# Patient Record
Sex: Male | Born: 1945 | Race: White | Hispanic: No | Marital: Married | Smoking: Former smoker
Health system: Southern US, Community
[De-identification: ages and names within clinical notes are randomized; demographics above are authoritative.]

## PROBLEM LIST (undated history)

## (undated) DIAGNOSIS — M25552 Pain in left hip: Secondary | ICD-10-CM

## (undated) DIAGNOSIS — N281 Cyst of kidney, acquired: Secondary | ICD-10-CM

## (undated) DIAGNOSIS — N400 Enlarged prostate without lower urinary tract symptoms: Secondary | ICD-10-CM

## (undated) DIAGNOSIS — T8859XA Other complications of anesthesia, initial encounter: Secondary | ICD-10-CM

## (undated) DIAGNOSIS — I10 Essential (primary) hypertension: Secondary | ICD-10-CM

## (undated) DIAGNOSIS — I6340 Cerebral infarction due to embolism of unspecified cerebral artery: Secondary | ICD-10-CM

## (undated) DIAGNOSIS — F32A Depression, unspecified: Secondary | ICD-10-CM

## (undated) DIAGNOSIS — Z7901 Long term (current) use of anticoagulants: Secondary | ICD-10-CM

## (undated) DIAGNOSIS — G4730 Sleep apnea, unspecified: Secondary | ICD-10-CM

## (undated) DIAGNOSIS — I5030 Unspecified diastolic (congestive) heart failure: Secondary | ICD-10-CM

## (undated) DIAGNOSIS — IMO0002 Reserved for concepts with insufficient information to code with codable children: Secondary | ICD-10-CM

## (undated) DIAGNOSIS — J45909 Unspecified asthma, uncomplicated: Secondary | ICD-10-CM

## (undated) DIAGNOSIS — I69391 Dysphagia following cerebral infarction: Secondary | ICD-10-CM

## (undated) DIAGNOSIS — E119 Type 2 diabetes mellitus without complications: Secondary | ICD-10-CM

## (undated) DIAGNOSIS — I639 Cerebral infarction, unspecified: Secondary | ICD-10-CM

## (undated) DIAGNOSIS — R200 Anesthesia of skin: Secondary | ICD-10-CM

## (undated) DIAGNOSIS — T4145XA Adverse effect of unspecified anesthetic, initial encounter: Secondary | ICD-10-CM

## (undated) DIAGNOSIS — I480 Paroxysmal atrial fibrillation: Secondary | ICD-10-CM

## (undated) DIAGNOSIS — M25551 Pain in right hip: Secondary | ICD-10-CM

## (undated) DIAGNOSIS — I509 Heart failure, unspecified: Secondary | ICD-10-CM

## (undated) DIAGNOSIS — Z87442 Personal history of urinary calculi: Secondary | ICD-10-CM

## (undated) DIAGNOSIS — I319 Disease of pericardium, unspecified: Secondary | ICD-10-CM

## (undated) DIAGNOSIS — F329 Major depressive disorder, single episode, unspecified: Secondary | ICD-10-CM

## (undated) DIAGNOSIS — G463 Brain stem stroke syndrome: Secondary | ICD-10-CM

## (undated) DIAGNOSIS — C801 Malignant (primary) neoplasm, unspecified: Secondary | ICD-10-CM

## (undated) DIAGNOSIS — D649 Anemia, unspecified: Secondary | ICD-10-CM

## (undated) DIAGNOSIS — I2699 Other pulmonary embolism without acute cor pulmonale: Secondary | ICD-10-CM

## (undated) DIAGNOSIS — Z8371 Family history of colonic polyps: Secondary | ICD-10-CM

## (undated) HISTORY — PX: ARTHROSCOPY KNEE W/ DRILLING: SUR92

## (undated) HISTORY — DX: Essential (primary) hypertension: I10

## (undated) HISTORY — DX: Brain stem stroke syndrome: G46.3

## (undated) HISTORY — DX: Dysphagia following cerebral infarction: I69.391

## (undated) HISTORY — DX: Sleep apnea, unspecified: G47.30

## (undated) HISTORY — DX: Unspecified diastolic (congestive) heart failure: I50.30

## (undated) HISTORY — DX: Family history of colonic polyps: Z83.71

## (undated) HISTORY — DX: Benign prostatic hyperplasia without lower urinary tract symptoms: N40.0

## (undated) HISTORY — DX: Unspecified asthma, uncomplicated: J45.909

## (undated) HISTORY — PX: TONSILLECTOMY: SUR1361

## (undated) HISTORY — DX: Other pulmonary embolism without acute cor pulmonale: I26.99

## (undated) HISTORY — PX: OTHER SURGICAL HISTORY: SHX169

## (undated) HISTORY — DX: Morbid (severe) obesity due to excess calories: E66.01

## (undated) HISTORY — DX: Cerebral infarction due to embolism of unspecified cerebral artery: I63.40

## (undated) HISTORY — DX: Major depressive disorder, single episode, unspecified: F32.9

## (undated) HISTORY — DX: Depression, unspecified: F32.A

## (undated) HISTORY — DX: Reserved for concepts with insufficient information to code with codable children: IMO0002

## (undated) HISTORY — PX: WISDOM TOOTH EXTRACTION: SHX21

## (undated) HISTORY — DX: Long term (current) use of anticoagulants: Z79.01

## (undated) HISTORY — PX: LITHOTRIPSY: SUR834

---
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
## 1987-04-04 HISTORY — PX: CHOLECYSTECTOMY: SHX55

## 1998-04-28 ENCOUNTER — Emergency Department (HOSPITAL_COMMUNITY): Admission: EM | Admit: 1998-04-28 | Discharge: 1998-04-28 | Payer: Self-pay | Admitting: Emergency Medicine

## 1999-12-23 ENCOUNTER — Encounter (INDEPENDENT_AMBULATORY_CARE_PROVIDER_SITE_OTHER): Payer: Self-pay | Admitting: *Deleted

## 1999-12-23 ENCOUNTER — Ambulatory Visit (HOSPITAL_BASED_OUTPATIENT_CLINIC_OR_DEPARTMENT_OTHER): Admission: RE | Admit: 1999-12-23 | Discharge: 1999-12-23 | Payer: Self-pay | Admitting: Orthopedic Surgery

## 2001-04-19 ENCOUNTER — Emergency Department (HOSPITAL_COMMUNITY): Admission: EM | Admit: 2001-04-19 | Discharge: 2001-04-19 | Payer: Self-pay | Admitting: Emergency Medicine

## 2001-04-19 ENCOUNTER — Encounter: Payer: Self-pay | Admitting: Emergency Medicine

## 2001-04-24 ENCOUNTER — Encounter: Admission: RE | Admit: 2001-04-24 | Discharge: 2001-04-24 | Payer: Self-pay | Admitting: Urology

## 2001-04-24 ENCOUNTER — Encounter: Payer: Self-pay | Admitting: Urology

## 2003-04-02 ENCOUNTER — Emergency Department (HOSPITAL_COMMUNITY): Admission: EM | Admit: 2003-04-02 | Discharge: 2003-04-03 | Payer: Self-pay | Admitting: Emergency Medicine

## 2003-04-03 ENCOUNTER — Ambulatory Visit (HOSPITAL_COMMUNITY): Admission: EM | Admit: 2003-04-03 | Discharge: 2003-04-03 | Payer: Self-pay | Admitting: Emergency Medicine

## 2003-04-09 ENCOUNTER — Ambulatory Visit (HOSPITAL_COMMUNITY): Admission: RE | Admit: 2003-04-09 | Discharge: 2003-04-09 | Payer: Self-pay | Admitting: Urology

## 2003-12-31 ENCOUNTER — Encounter: Payer: Self-pay | Admitting: Internal Medicine

## 2004-02-05 ENCOUNTER — Ambulatory Visit: Payer: Self-pay | Admitting: Internal Medicine

## 2004-09-02 ENCOUNTER — Ambulatory Visit: Payer: Self-pay | Admitting: Internal Medicine

## 2004-09-08 ENCOUNTER — Ambulatory Visit: Payer: Self-pay | Admitting: Psychiatry

## 2004-09-08 ENCOUNTER — Ambulatory Visit: Payer: Self-pay | Admitting: Internal Medicine

## 2004-09-08 ENCOUNTER — Inpatient Hospital Stay (HOSPITAL_COMMUNITY): Admission: RE | Admit: 2004-09-08 | Discharge: 2004-09-15 | Payer: Self-pay | Admitting: Psychiatry

## 2004-09-19 ENCOUNTER — Other Ambulatory Visit (HOSPITAL_COMMUNITY): Admission: RE | Admit: 2004-09-19 | Discharge: 2004-09-22 | Payer: Self-pay | Admitting: Psychiatry

## 2005-04-28 ENCOUNTER — Ambulatory Visit: Payer: Self-pay | Admitting: Internal Medicine

## 2005-08-02 ENCOUNTER — Ambulatory Visit: Payer: Self-pay | Admitting: Internal Medicine

## 2005-08-08 ENCOUNTER — Ambulatory Visit: Payer: Self-pay | Admitting: Internal Medicine

## 2005-08-10 ENCOUNTER — Ambulatory Visit (HOSPITAL_COMMUNITY): Payer: Self-pay | Admitting: *Deleted

## 2005-10-10 ENCOUNTER — Ambulatory Visit (HOSPITAL_COMMUNITY): Payer: Self-pay | Admitting: *Deleted

## 2005-10-24 ENCOUNTER — Ambulatory Visit: Payer: Self-pay | Admitting: Internal Medicine

## 2005-10-25 ENCOUNTER — Ambulatory Visit (HOSPITAL_BASED_OUTPATIENT_CLINIC_OR_DEPARTMENT_OTHER): Admission: RE | Admit: 2005-10-25 | Discharge: 2005-10-25 | Payer: Self-pay | Admitting: Internal Medicine

## 2005-11-03 ENCOUNTER — Ambulatory Visit: Payer: Self-pay | Admitting: Pulmonary Disease

## 2005-11-27 ENCOUNTER — Ambulatory Visit (HOSPITAL_COMMUNITY): Admission: RE | Admit: 2005-11-27 | Discharge: 2005-11-27 | Payer: Self-pay | Admitting: Orthopedic Surgery

## 2005-12-12 ENCOUNTER — Ambulatory Visit (HOSPITAL_COMMUNITY): Payer: Self-pay | Admitting: *Deleted

## 2005-12-18 ENCOUNTER — Ambulatory Visit: Payer: Self-pay | Admitting: Internal Medicine

## 2005-12-26 ENCOUNTER — Ambulatory Visit: Payer: Self-pay | Admitting: Internal Medicine

## 2006-02-06 ENCOUNTER — Ambulatory Visit (HOSPITAL_COMMUNITY): Payer: Self-pay | Admitting: *Deleted

## 2006-04-17 ENCOUNTER — Ambulatory Visit (HOSPITAL_COMMUNITY): Payer: Self-pay | Admitting: *Deleted

## 2006-04-18 ENCOUNTER — Ambulatory Visit: Payer: Self-pay | Admitting: Internal Medicine

## 2006-04-18 LAB — CONVERTED CEMR LAB
AST: 22 units/L (ref 0–37)
Creatinine,U: 159.9 mg/dL
Direct LDL: 184 mg/dL
Microalb Creat Ratio: 2.5 mg/g (ref 0.0–30.0)
Microalb, Ur: 0.4 mg/dL (ref 0.0–1.9)
Potassium: 4 meq/L (ref 3.5–5.1)
Total CHOL/HDL Ratio: 8.7
Triglycerides: 192 mg/dL — ABNORMAL HIGH (ref 0–149)

## 2006-06-06 ENCOUNTER — Ambulatory Visit: Payer: Self-pay | Admitting: Internal Medicine

## 2006-06-12 ENCOUNTER — Ambulatory Visit (HOSPITAL_COMMUNITY): Payer: Self-pay | Admitting: *Deleted

## 2006-06-13 ENCOUNTER — Ambulatory Visit: Payer: Self-pay

## 2006-06-14 ENCOUNTER — Ambulatory Visit: Payer: Self-pay

## 2006-06-25 ENCOUNTER — Ambulatory Visit: Payer: Self-pay | Admitting: Internal Medicine

## 2006-06-25 ENCOUNTER — Inpatient Hospital Stay (HOSPITAL_COMMUNITY): Admission: RE | Admit: 2006-06-25 | Discharge: 2006-06-29 | Payer: Self-pay | Admitting: Orthopedic Surgery

## 2006-06-26 ENCOUNTER — Ambulatory Visit: Payer: Self-pay | Admitting: Physical Medicine & Rehabilitation

## 2006-06-27 ENCOUNTER — Encounter (INDEPENDENT_AMBULATORY_CARE_PROVIDER_SITE_OTHER): Payer: Self-pay | Admitting: *Deleted

## 2006-06-28 DIAGNOSIS — N400 Enlarged prostate without lower urinary tract symptoms: Secondary | ICD-10-CM | POA: Insufficient documentation

## 2006-06-28 DIAGNOSIS — I10 Essential (primary) hypertension: Secondary | ICD-10-CM | POA: Insufficient documentation

## 2006-06-28 DIAGNOSIS — M109 Gout, unspecified: Secondary | ICD-10-CM | POA: Insufficient documentation

## 2006-06-29 ENCOUNTER — Ambulatory Visit: Payer: Self-pay | Admitting: Physical Medicine & Rehabilitation

## 2006-06-29 ENCOUNTER — Inpatient Hospital Stay (HOSPITAL_COMMUNITY)
Admission: RE | Admit: 2006-06-29 | Discharge: 2006-07-04 | Payer: Self-pay | Admitting: Physical Medicine & Rehabilitation

## 2006-08-14 ENCOUNTER — Ambulatory Visit (HOSPITAL_COMMUNITY): Payer: Self-pay | Admitting: *Deleted

## 2006-09-04 ENCOUNTER — Telehealth (INDEPENDENT_AMBULATORY_CARE_PROVIDER_SITE_OTHER): Payer: Self-pay | Admitting: *Deleted

## 2006-09-13 ENCOUNTER — Ambulatory Visit (HOSPITAL_COMMUNITY): Payer: Self-pay | Admitting: *Deleted

## 2006-10-30 ENCOUNTER — Ambulatory Visit (HOSPITAL_COMMUNITY): Payer: Self-pay | Admitting: *Deleted

## 2006-12-12 ENCOUNTER — Ambulatory Visit: Payer: Self-pay | Admitting: Internal Medicine

## 2006-12-16 LAB — CONVERTED CEMR LAB
ALT: 22 units/L (ref 0–53)
Bilirubin, Direct: 0.1 mg/dL (ref 0.0–0.3)
CO2: 31 meq/L (ref 19–32)
Cholesterol: 188 mg/dL (ref 0–200)
Creatinine,U: 105.2 mg/dL
GFR calc Af Amer: 98 mL/min
GFR calc non Af Amer: 81 mL/min
Glucose, Bld: 110 mg/dL — ABNORMAL HIGH (ref 70–99)
HDL: 27.3 mg/dL — ABNORMAL LOW (ref >39.0)
Hgb A1c MFr Bld: 5.8 % (ref 4.6–6.0)
Microalb Creat Ratio: 10.5 mg/g (ref 0.0–30.0)
Microalb, Ur: 1.1 mg/dL (ref 0.0–1.9)
Potassium: 4.2 meq/L (ref 3.5–5.1)
Sodium: 143 meq/L (ref 135–145)
Total CHOL/HDL Ratio: 6.9
Total Protein: 6.6 g/dL (ref 6.0–8.3)
Triglycerides: 228 mg/dL (ref 0–149)

## 2006-12-17 ENCOUNTER — Encounter (INDEPENDENT_AMBULATORY_CARE_PROVIDER_SITE_OTHER): Payer: Self-pay | Admitting: *Deleted

## 2006-12-20 ENCOUNTER — Ambulatory Visit (HOSPITAL_COMMUNITY): Payer: Self-pay | Admitting: *Deleted

## 2007-03-19 ENCOUNTER — Ambulatory Visit (HOSPITAL_COMMUNITY): Payer: Self-pay | Admitting: *Deleted

## 2007-04-25 ENCOUNTER — Telehealth (INDEPENDENT_AMBULATORY_CARE_PROVIDER_SITE_OTHER): Payer: Self-pay | Admitting: *Deleted

## 2007-04-29 ENCOUNTER — Telehealth (INDEPENDENT_AMBULATORY_CARE_PROVIDER_SITE_OTHER): Payer: Self-pay | Admitting: *Deleted

## 2007-05-08 ENCOUNTER — Telehealth (INDEPENDENT_AMBULATORY_CARE_PROVIDER_SITE_OTHER): Payer: Self-pay | Admitting: *Deleted

## 2007-06-18 ENCOUNTER — Ambulatory Visit (HOSPITAL_COMMUNITY): Payer: Self-pay | Admitting: *Deleted

## 2007-07-11 ENCOUNTER — Telehealth (INDEPENDENT_AMBULATORY_CARE_PROVIDER_SITE_OTHER): Payer: Self-pay | Admitting: *Deleted

## 2007-08-06 ENCOUNTER — Ambulatory Visit: Payer: Self-pay | Admitting: Internal Medicine

## 2007-08-06 DIAGNOSIS — E8881 Metabolic syndrome: Secondary | ICD-10-CM | POA: Insufficient documentation

## 2007-08-06 LAB — CONVERTED CEMR LAB: LDL Goal: 100 mg/dL

## 2007-08-07 ENCOUNTER — Ambulatory Visit: Payer: Self-pay | Admitting: Internal Medicine

## 2007-08-07 LAB — CONVERTED CEMR LAB
AST: 27 units/L (ref 0–37)
Albumin: 3.5 g/dL (ref 3.5–5.2)
Creatinine, Ser: 1.1 mg/dL (ref 0.4–1.5)
HDL: 25.2 mg/dL — ABNORMAL LOW (ref >39.0)
Hgb A1c MFr Bld: 6 % (ref 4.6–6.0)
Potassium: 3.7 meq/L (ref 3.5–5.1)
Total Bilirubin: 0.9 mg/dL (ref 0.3–1.2)
Total CHOL/HDL Ratio: 7.3
Triglycerides: 197 mg/dL — ABNORMAL HIGH (ref 0–149)
VLDL: 39 mg/dL (ref 0–40)

## 2007-08-09 ENCOUNTER — Encounter: Payer: Self-pay | Admitting: Internal Medicine

## 2007-08-13 ENCOUNTER — Encounter (INDEPENDENT_AMBULATORY_CARE_PROVIDER_SITE_OTHER): Payer: Self-pay | Admitting: *Deleted

## 2007-09-17 ENCOUNTER — Ambulatory Visit (HOSPITAL_COMMUNITY): Payer: Self-pay | Admitting: *Deleted

## 2007-12-10 ENCOUNTER — Ambulatory Visit (HOSPITAL_COMMUNITY): Payer: Self-pay | Admitting: *Deleted

## 2008-03-10 ENCOUNTER — Ambulatory Visit (HOSPITAL_COMMUNITY): Payer: Self-pay | Admitting: *Deleted

## 2008-04-03 DIAGNOSIS — Z83719 Family history of colon polyps, unspecified: Secondary | ICD-10-CM

## 2008-04-03 DIAGNOSIS — Z8371 Family history of colonic polyps: Secondary | ICD-10-CM

## 2008-04-03 HISTORY — DX: Family history of colonic polyps: Z83.71

## 2008-04-03 HISTORY — DX: Family history of colon polyps, unspecified: Z83.719

## 2008-06-09 ENCOUNTER — Ambulatory Visit (HOSPITAL_COMMUNITY): Payer: Self-pay | Admitting: *Deleted

## 2008-08-27 ENCOUNTER — Telehealth (INDEPENDENT_AMBULATORY_CARE_PROVIDER_SITE_OTHER): Payer: Self-pay | Admitting: *Deleted

## 2008-09-08 ENCOUNTER — Ambulatory Visit (HOSPITAL_COMMUNITY): Payer: Self-pay | Admitting: *Deleted

## 2008-09-10 ENCOUNTER — Ambulatory Visit: Payer: Self-pay | Admitting: Internal Medicine

## 2008-09-10 DIAGNOSIS — F32A Depression, unspecified: Secondary | ICD-10-CM | POA: Insufficient documentation

## 2008-09-10 DIAGNOSIS — F329 Major depressive disorder, single episode, unspecified: Secondary | ICD-10-CM

## 2008-09-10 DIAGNOSIS — Z87442 Personal history of urinary calculi: Secondary | ICD-10-CM | POA: Insufficient documentation

## 2008-09-10 DIAGNOSIS — G4733 Obstructive sleep apnea (adult) (pediatric): Secondary | ICD-10-CM | POA: Insufficient documentation

## 2008-09-10 DIAGNOSIS — E785 Hyperlipidemia, unspecified: Secondary | ICD-10-CM | POA: Insufficient documentation

## 2008-09-11 ENCOUNTER — Ambulatory Visit: Payer: Self-pay | Admitting: Internal Medicine

## 2008-09-14 LAB — CONVERTED CEMR LAB
ALT: 28 units/L (ref 0–53)
AST: 30 units/L (ref 0–37)
Albumin: 3.4 g/dL — ABNORMAL LOW (ref 3.5–5.2)
BUN: 21 mg/dL (ref 6–23)
Creatinine, Ser: 1 mg/dL (ref 0.4–1.5)
HDL: 28.9 mg/dL — ABNORMAL LOW (ref >39.00)
TSH: 1.67 microintl units/mL (ref 0.35–5.50)
Total CHOL/HDL Ratio: 6
Uric Acid, Serum: 7.3 mg/dL (ref 4.0–7.8)
VLDL: 34.8 mg/dL (ref 0.0–40.0)

## 2008-09-16 ENCOUNTER — Encounter (INDEPENDENT_AMBULATORY_CARE_PROVIDER_SITE_OTHER): Payer: Self-pay | Admitting: *Deleted

## 2008-09-17 ENCOUNTER — Telehealth (INDEPENDENT_AMBULATORY_CARE_PROVIDER_SITE_OTHER): Payer: Self-pay | Admitting: *Deleted

## 2008-09-17 ENCOUNTER — Ambulatory Visit: Payer: Self-pay | Admitting: Internal Medicine

## 2008-10-27 ENCOUNTER — Ambulatory Visit (HOSPITAL_COMMUNITY): Admission: RE | Admit: 2008-10-27 | Discharge: 2008-10-27 | Payer: Self-pay | Admitting: Internal Medicine

## 2008-10-27 ENCOUNTER — Encounter: Payer: Self-pay | Admitting: Internal Medicine

## 2008-10-27 ENCOUNTER — Ambulatory Visit: Payer: Self-pay | Admitting: Internal Medicine

## 2008-10-28 ENCOUNTER — Encounter: Payer: Self-pay | Admitting: Internal Medicine

## 2008-12-01 ENCOUNTER — Encounter: Payer: Self-pay | Admitting: Internal Medicine

## 2008-12-08 ENCOUNTER — Ambulatory Visit (HOSPITAL_COMMUNITY): Payer: Self-pay | Admitting: *Deleted

## 2008-12-11 ENCOUNTER — Encounter: Payer: Self-pay | Admitting: Internal Medicine

## 2009-02-01 ENCOUNTER — Ambulatory Visit (HOSPITAL_COMMUNITY): Payer: Self-pay | Admitting: Psychiatry

## 2009-04-05 ENCOUNTER — Ambulatory Visit (HOSPITAL_COMMUNITY): Payer: Self-pay | Admitting: Psychiatry

## 2009-04-28 ENCOUNTER — Ambulatory Visit (HOSPITAL_COMMUNITY): Payer: Self-pay | Admitting: Licensed Clinical Social Worker

## 2009-04-30 ENCOUNTER — Ambulatory Visit (HOSPITAL_COMMUNITY): Payer: Self-pay | Admitting: Psychiatry

## 2009-05-26 ENCOUNTER — Ambulatory Visit (HOSPITAL_COMMUNITY): Payer: Self-pay | Admitting: Licensed Clinical Social Worker

## 2009-06-07 ENCOUNTER — Ambulatory Visit (HOSPITAL_COMMUNITY): Payer: Self-pay | Admitting: Psychiatry

## 2009-06-17 ENCOUNTER — Ambulatory Visit (HOSPITAL_COMMUNITY): Payer: Self-pay | Admitting: Licensed Clinical Social Worker

## 2009-06-28 ENCOUNTER — Ambulatory Visit (HOSPITAL_COMMUNITY): Payer: Self-pay | Admitting: Licensed Clinical Social Worker

## 2009-07-05 ENCOUNTER — Ambulatory Visit (HOSPITAL_COMMUNITY): Payer: Self-pay | Admitting: Psychiatry

## 2009-08-02 ENCOUNTER — Ambulatory Visit (HOSPITAL_COMMUNITY): Payer: Self-pay | Admitting: Psychiatry

## 2009-08-10 ENCOUNTER — Ambulatory Visit (HOSPITAL_COMMUNITY): Payer: Self-pay | Admitting: Licensed Clinical Social Worker

## 2009-08-26 ENCOUNTER — Ambulatory Visit (HOSPITAL_COMMUNITY): Payer: Self-pay | Admitting: Licensed Clinical Social Worker

## 2009-09-03 ENCOUNTER — Ambulatory Visit (HOSPITAL_COMMUNITY): Payer: Self-pay | Admitting: Licensed Clinical Social Worker

## 2009-09-09 ENCOUNTER — Ambulatory Visit (HOSPITAL_COMMUNITY): Payer: Self-pay | Admitting: Licensed Clinical Social Worker

## 2009-09-21 ENCOUNTER — Ambulatory Visit (HOSPITAL_COMMUNITY): Payer: Self-pay | Admitting: Licensed Clinical Social Worker

## 2009-09-29 ENCOUNTER — Ambulatory Visit (HOSPITAL_COMMUNITY): Payer: Self-pay | Admitting: Licensed Clinical Social Worker

## 2009-10-18 ENCOUNTER — Ambulatory Visit (HOSPITAL_COMMUNITY): Payer: Self-pay | Admitting: Licensed Clinical Social Worker

## 2009-10-25 ENCOUNTER — Ambulatory Visit (HOSPITAL_COMMUNITY): Payer: Self-pay | Admitting: Licensed Clinical Social Worker

## 2009-11-01 ENCOUNTER — Ambulatory Visit (HOSPITAL_COMMUNITY): Payer: Self-pay | Admitting: Psychiatry

## 2009-11-02 ENCOUNTER — Ambulatory Visit: Payer: Self-pay | Admitting: Internal Medicine

## 2009-11-02 ENCOUNTER — Ambulatory Visit (HOSPITAL_COMMUNITY): Payer: Self-pay | Admitting: Licensed Clinical Social Worker

## 2009-11-02 DIAGNOSIS — G629 Polyneuropathy, unspecified: Secondary | ICD-10-CM | POA: Insufficient documentation

## 2009-11-02 DIAGNOSIS — Z860100 Personal history of colon polyps, unspecified: Secondary | ICD-10-CM | POA: Insufficient documentation

## 2009-11-02 DIAGNOSIS — R972 Elevated prostate specific antigen [PSA]: Secondary | ICD-10-CM | POA: Insufficient documentation

## 2009-11-03 ENCOUNTER — Ambulatory Visit: Payer: Self-pay | Admitting: Internal Medicine

## 2009-11-04 LAB — CONVERTED CEMR LAB
Albumin: 3.7 g/dL (ref 3.5–5.2)
BUN: 20 mg/dL (ref 6–23)
Basophils Absolute: 0.1 10*3/uL (ref 0.0–0.1)
CO2: 28 meq/L (ref 19–32)
Calcium: 8.6 mg/dL (ref 8.4–10.5)
Cholesterol: 166 mg/dL (ref 0–200)
Creatinine, Ser: 1.1 mg/dL (ref 0.4–1.5)
Glucose, Bld: 97 mg/dL (ref 70–99)
HCT: 42.7 % (ref 39.0–52.0)
HDL: 30.3 mg/dL — ABNORMAL LOW (ref >39.00)
Hemoglobin: 14.3 g/dL (ref 13.0–17.0)
Lymphs Abs: 1.5 10*3/uL (ref 0.7–4.0)
MCHC: 33.6 g/dL (ref 30.0–36.0)
MCV: 93.1 fL (ref 78.0–100.0)
Monocytes Relative: 8.4 % (ref 3.0–12.0)
Neutro Abs: 6.3 10*3/uL (ref 1.4–7.7)
RDW: 13.9 % (ref 11.5–14.6)
TSH: 1.86 microintl units/mL (ref 0.35–5.50)
Total Protein: 6.3 g/dL (ref 6.0–8.3)
Triglycerides: 157 mg/dL — ABNORMAL HIGH (ref 0.0–149.0)

## 2009-11-17 ENCOUNTER — Ambulatory Visit (HOSPITAL_COMMUNITY): Payer: Self-pay | Admitting: Licensed Clinical Social Worker

## 2009-11-22 ENCOUNTER — Telehealth: Payer: Self-pay | Admitting: Internal Medicine

## 2009-11-30 ENCOUNTER — Ambulatory Visit (HOSPITAL_COMMUNITY): Payer: Self-pay | Admitting: Licensed Clinical Social Worker

## 2009-12-09 ENCOUNTER — Ambulatory Visit (HOSPITAL_COMMUNITY): Payer: Self-pay | Admitting: Licensed Clinical Social Worker

## 2009-12-17 ENCOUNTER — Ambulatory Visit (HOSPITAL_COMMUNITY): Payer: Self-pay | Admitting: Licensed Clinical Social Worker

## 2010-01-03 ENCOUNTER — Ambulatory Visit (HOSPITAL_COMMUNITY): Payer: Self-pay | Admitting: Psychiatry

## 2010-01-04 ENCOUNTER — Ambulatory Visit (HOSPITAL_COMMUNITY): Payer: Self-pay | Admitting: Licensed Clinical Social Worker

## 2010-02-10 ENCOUNTER — Ambulatory Visit (HOSPITAL_COMMUNITY): Payer: Self-pay | Admitting: Licensed Clinical Social Worker

## 2010-03-07 ENCOUNTER — Ambulatory Visit (HOSPITAL_COMMUNITY): Payer: Self-pay | Admitting: Psychiatry

## 2010-05-03 NOTE — Assessment & Plan Note (Signed)
Summary: med refill/kn   Vital Signs:  Patient profile:   65 year old male Height:      71.25 inches Weight:      338.2 pounds BMI:     47.01 Temp:     98.9 degrees F oral Pulse rate:   84 / minute Resp:     16 per minute BP sitting:   118 / 70  (left arm) Cuff size:   large  Vitals Entered By: Shonna Chock CMA (November 02, 2009 2:42 PM)    History of Present Illness: [name] is here for a physical; he is essentially asymptomatic except for decreased sensation in feet gradually  progressive over 5- 6 yrs. This has affected his sense of balance.  Lipid Management History:      Positive NCEP/ATP III risk factors include male age 72 years old or older, HDL cholesterol less than 40, family history for ischemic heart disease (males less than 79 years old), and hypertension.  Negative NCEP/ATP III risk factors include non-diabetic, non-tobacco-user status, no ASHD (atherosclerotic heart disease), no prior stroke/TIA, no peripheral vascular disease, and no history of aortic aneurysm.     Allergies: 1)  ! Darvocet 2)  ! Codeine 3)  ! Morphine 4)  ! Pcn  Past History:  Past Medical History: BENIGN PROSTATIC HYPERTROPHY (ICD-600.00), Dr  Londell Moh Kimbrough HYPERTENSION (ICD-401.9) GOUT (ICD-274.9) Nephrolithiasis, PMH  of, uric acid stone X2 Sleep Apnea, CPAP  Depression, Dr  Lolly Mustache Complex Renal Cyst Hyperlipidemia:NMR 2005: LDL 132 ( 2075/1674), HDL 27, TG 132. LDL  goal = < 100. Framingham Study LDL goal = < 130. Colonic polyps, hx of  Past Surgical History: Total knee replacement   bilaterally  06/2006 LUE  CTS  and trigger finger surgically repaired Cholecystectomy Renal calculi (urate) X2, 1st passed spontaneously Tonsillectomy lithotripsy 2005 X1 oral surgery knee surgery arthroscopy 11/2005 Colonoscopy negative; Elevated PSA, PMH of, biopsy X 2 negative Colon polypectomy 2010, due 2015  Family History: mother: htn, depression father: MI  @ age 94 ,died age  43 paternal uncle: diabetes paternal cousin :diabetes  paternal grandmother: MI in 67's paternal uncle: MI in 20's; paternal aunt: cva; bro; depression  Social History: Former Smoker: quit 1979 Retired Married Alcohol use-yes: occasionally  Regular exercise-yes:  gym 60-90 min as [employer] bike &  machines  Review of Systems General:  Denies chills, fever, and sweats; Apnea controlled with CPAP. Eyes:  Denies blurring, double vision, and vision loss-both eyes. ENT:  Denies difficulty swallowing and hoarseness. CV:  Complains of swelling of feet; denies chest pain or discomfort, difficulty breathing at night, difficulty breathing while lying down, shortness of breath with exertion, and swelling of hands; Occasional chest pain. Resp:  Denies cough, excessive snoring, hypersomnolence, and sputum productive. GI:  Denies abdominal pain, bloody stools, dark tarry stools, and indigestion. GU:  Denies discharge, dysuria, and hematuria; Dr Aldean Ast seen every 6 months. MS:  Complains of joint pain; denies joint redness and joint swelling; Occasional hip & ankle pain; no Rx. Derm:  Denies dryness and hair loss; Trauma induced toenail loss. Neuro:  Denies disturbances in coordination, headaches, and tingling; No burning in feet. Psych:  Denies anxiety and depression; Symptoms stable with meds & therapy. Endo:  Denies excessive hunger, excessive thirst, excessive urination, and heat intolerance; Avoids cool air to arms. Heme:  Denies abnormal bruising and bleeding. Allergy:  Denies itching eyes and sneezing.  Physical Exam  General:   alert,appropriate and cooperative throughout examination Head:  Normocephalic and atraumatic without obvious abnormalities. No apparent alopecia ; moustache Eyes:  No corneal or conjunctival inflammation noted.Perrla. Funduscopic exam benign, without hemorrhages, exudates or papilledema. Ears:  External ear exam shows no significant lesions or deformities.   Otoscopic examination reveals clear canals, tympanic membranes are intact bilaterally without bulging, retraction, inflammation or discharge. Hearing is grossly normal bilaterally. Nose:  External nasal examination shows no deformity or inflammation. Nasal mucosa are pink and moist without lesions or exudates. Mouth:  Oral mucosa and oropharynx without lesions or exudates.  Teeth in good repair. Neck:  No deformities, masses, or tenderness noted. Lungs:  Normal respiratory effort, chest expands symmetrically. Lungs are clear to auscultation, no crackles or wheezes. Heart:  Normal rate and regular rhythm. S1 and S2 normal without gallop, murmur, click, rub. s4 with slurring Abdomen:  Bowel sounds positive,abdomen soft and non-tender without masses, organomegaly or hernias noted. Abdomen protuberant Rectal:  Dr Madaline Guthrie:  Dr Aldean Ast Prostate:  Dr Aldean Ast Msk:  No deformity or scoliosis noted of thoracic or lumbar spine.   Pulses:  R and L carotid,radial,dorsalis pedis and posterior tibial pulses are full and equal bilaterally Extremities:  No clubbing, cyanosis, edema. Crepitus of knees.DIP amputation R index finger Neurologic:  alert & oriented X3, sensation  decreased  to light touch over R foot, and DTRs symmetrical but decreased @ knees Skin:  Intact without suspicious lesions or rashes Cervical Nodes:  No lymphadenopathy noted Axillary Nodes:  No palpable lymphadenopathy Psych:  memory intact for recent and remote, normally interactive, good eye contact, not anxious appearing, and not depressed appearing.     Impression & Recommendations:  Problem # 1:  ROUTINE GENERAL MEDICAL EXAM@HEALTH  CARE FACL (ICD-V70.0)  Orders: EKG w/ Interpretation (93000)  Problem # 2:  PARESTHESIA (ICD-782.0) progressive in feet  Problem # 3:  HYPERLIPIDEMIA (ICD-272.4)  His updated medication list for this problem includes:    Pravastatin Sodium 40 Mg Tabs (Pravastatin sodium) .[name]... 1 by  mouth once daily patient needs office visit and lab work  Problem # 4:  DYSMETABOLIC SYNDROME X (ICD-277.7)  Problem # 5:  SLEEP APNEA (ICD-780.57)  Problem # 6:  PROSTATE SPECIFIC ANTIGEN, ELEVATED (ICD-790.93) PMH of  Problem # 7:  HYPERTENSION (ICD-401.9) controlled His updated medication list for this problem includes:    Amlodipine Besy-benazepril Hcl 5-10 Mg Caps (Amlodipine besy-benazepril hcl) .[name]... 1 by mouth once daily  Problem # 8:  GOUT (ICD-274.9) PMH of His updated medication list for this problem includes:    Allopurinol 300 Mg Tabs (Allopurinol) .[name]... 1 by mouth once daily  Complete Medication List: 1)  Pravastatin Sodium 40 Mg Tabs (Pravastatin sodium) .[name].. 1 by mouth once daily patient needs office visit and lab work 2)  Amlodipine Besy-benazepril Hcl 5-10 Mg Caps (Amlodipine besy-benazepril hcl) .[name].. 1 by mouth once daily 3)  Sertraline Hcl 100 Mg Tabs (Sertraline hcl) .[name].. 1 1/2 by mouth once daily 4)  Allopurinol 300 Mg Tabs (Allopurinol) .[name].. 1 by mouth once daily 5)  Lorazepam 0.5 Mg Tabs (Lorazepam) .[name].. 1 by mouth am, 2 by mouth pm 6)  Risperdal M-tab 1 Mg Tbdp (Risperidone) .[name].. 1 by mouth once daily 7)  Asa 250 Qd  8)  Multivitamin  9)  Finasteride 5 Mg Tabs (Finasteride) .[name].. 1 by mouth once daily  Other Orders: Tdap => 80yrs IM ([address]109) Admin 1st Vaccine (60454)  Lipid Assessment/Plan:      Based on NCEP/ATP III, the patient's risk factor category is "0-1 risk  factors".  The patient's lipid goals are as follows: Total cholesterol goal is 200; LDL cholesterol goal is 100; HDL cholesterol goal is 40; Triglyceride goal is 150.  His LDL cholesterol goal has not been met.  Secondary causes for hyperlipidemia have been ruled out.  He has been counseled on adjunctive measures for lowering his cholesterol and has been provided with dietary instructions.    Patient Instructions: 1)  Please schedule fasting labs: Uric acid; B12 level; VDRL; 2)  BMP; 3)   Hepatic Panel ; 4)  Lipid Panel ; 5)  TSH ; 6)  CBC w/ Diff; 7)  PSA ; 8)  HbgA1C . Prescriptions: AMLODIPINE BESY-BENAZEPRIL HCL 5-10 MG  CAPS (AMLODIPINE BESY-BENAZEPRIL HCL) 1 by mouth once daily  #90 x 3   Entered and Authorized by:   Marga Melnick MD   Signed by:   Marga Melnick MD on 11/02/2009   Method used:   Faxed to ...       [name] (retail)       [address] [place], [place]  [address]       Ph: [phone]       Fax: [phone]   RxID:   [id number]    Immunizations Administered:  Tetanus Vaccine:    Vaccine Type: Tdap    Site: right deltoid    Mfr: GlaxoSmithKline    Dose: 0.5 ml    Route: IM    Given by: Shonna Chock CMA    Exp. Date: 06/26/2011    Lot #: [id number]    VIS given: 02/19/07 version given November 02, 2009.

## 2010-05-03 NOTE — Progress Notes (Signed)
Summary: NEEDS DR HOPPER TO REVIEW TO SEE IF LAB CAN BE BILLD DIFFERENTLY  Phone Note Call from Patient Call back at Home Phone [phone]   Caller: Patient Summary of Call: PATIENT CALLED TO ASK WHY HE IS GETTING A BILL FOR $55.75 FROM [facility] BECAUSE HE HAS MEDICARE AND [employer] [place] AND SAYS HE SHOULD NOT BE BILLED FOR ANYTHING   I CALLED [facility] ([facility] = [phone])  (PATIENT ACCT NUMBER = [id number]) AND REP SAYS THAT HE IS BEING BILLED FOR [id number] [facility]---REP SAID WE USED THE FOLLOWING CODES IN THIS ORDER:  V70.0, 782.0, 277.7  PATIENT WOULD LIKE DR HOPPER TO REVIEW TO SEE IF THIS COULD BE BILLED DIFFERENTLY  PLEASE CALL PATIENT WITH RESPONSE AT [phone] Initial call taken by: Jerolyn Shin,  November 22, 2009 4:20 PM  Follow-up for Phone Call        RPR requested for symptoms of PERIPHERAL NEUROPATHY; insurance should cover this under that Code Follow-up by: Marga Melnick MD,  November 22, 2009 5:10 PM     Appended Document: NEEDS DR HOPPER TO REVIEW TO SEE IF LAB CAN BE BILLD DIFFERENTLY he had paresthesias ; syphilitic nerve involvement  is in the differential necessitating this screen. How can this be billed to help patient? Thanks, [employer]

## 2010-06-08 ENCOUNTER — Encounter (INDEPENDENT_AMBULATORY_CARE_PROVIDER_SITE_OTHER): Payer: No Typology Code available for payment source | Admitting: Psychiatry

## 2010-06-08 DIAGNOSIS — F331 Major depressive disorder, recurrent, moderate: Secondary | ICD-10-CM

## 2010-08-16 NOTE — Discharge Summary (Signed)
[name], [name]             ACCOUNT NO.:  [id number]   MEDICAL RECORD NO.:  [id number]          PATIENT TYPE:  INP   LOCATION:  1510                         FACILITY:  [facility]   PHYSICIAN:  Madlyn Frankel. Charlann Boxer, M.D.  DATE OF BIRTH:  1945/10/10   DATE OF ADMISSION:  06/25/2006  DATE OF DISCHARGE:  06/29/2006                               DISCHARGE SUMMARY   ADMITTING DIAGNOSES:  1. Osteoarthritis.  2. Hypertension.  3. Diverticulitis.  4. Depression.   DISCHARGE DIAGNOSIS:  1. Osteoarthritis.  2. Hypertension.  3. Depression.  4. Diverticulitis.  5. Acute renal failure.   CONSULTANTS:  Dr. Lovell Sheehan for acute renal failure.  [facility] for rehab  consult for placement.  [facility] for medical consult for  acute renal failure.   PROCEDURE:  Bilateral total knee replacement.   SURGEON:  Durene Romans, M.D.   ASSISTANT:  Dwyane Luo, PA-C.   HISTORY OF PRESENT ILLNESS:  [name] is a 65 year old gentleman who  has persistent progressive knee pain over the past 5-6 years.  He has  also had previous arthroscopic intervention on his right knee in the  past.  He has failed injections, all conservative measures including  visco supplementations.  He was cleared for surgery by his primary care  doctor, Dr. Marga Melnick.  Due to diminished quality of life, he was  cleared for bilateral total knee replacements.   LABORATORY DATA:  CBC preoperatively showed hematocrit 42.5.  Postop day  #1:  White cells up to 11.3, hematocrit 33.2.  Postop day #2 white cell  still at 10.6, hematocrit 28.  Postop day #3 hematocrit 27.7.  Postop  day #4, hematocrit 25.5.  Preadmission INR showed 1.0 preoperatively.  By postop day #2 his INR was up to 2.4 and postoperative day #3 it was  3.5.  Preadmission chemistry shows sodium 142, potassium 4.0, glucose  112, creatinine 1.09.  Postop day #1 sodium 134, potassium 4.3, glucose  128, creatinine 2.05.  Later on in the day at 1918 hours sodium  131,  glucose 129, creatinine was 4.04.  Post a day #2 at 0430 hours  creatinine was 4.31.  By that day at 1505 hours creatinine was coming  down at 3.99, post day #2 on June 29, 2006 creatinine was resolving at  2.05.  Preadmission GFR was greater than 60.  On June 26, 2006 his GFR  was  33.  Same day it was 15.  At 1918 hours on June 27, 2006 it was  14.  The next day June 27, 2006 GFR 15.  On June 29, 2006 it was 68.  Calcium remained slightly depressed at 7.7 and upon discharge was 8.0.  GI workup:  Admission all within normal limits.  On March 28, 208 his  total protein was 5, albumin 2, AST 30, ALP was 130.  Preadmission  urinalysis on June 18, 2006 was all negative.  Urinalysis on June 27, 2006 showed turbid appearance, large hemoglobin, small bilirubin  moderate, leukocyte esterase as well as some phosphate hyaline casts.  Urine culture showed no growth.   RADIOLOGY:  Portable chest one-view impression:  Cardiomegaly and  chronic lung changes.  No acute process.  Renal ultrasound.  Impression:  1) No evidence for hydronephrosis.  2)  Bilateral renal cyst.  Prominent  parapelvic cyst in left kidney.  3)  Kidney signs of discrepancy may be  a technical factor.   EKG:  Borderline abnormal EKG.   CARDIOLOGY:  [facility] nuclear study as well as stress echo  performed preoperatively.  They showed no evidence of ischemia.  Ejection fraction 59%.   HOSPITAL COURSE:  The patient underwent bilateral total knee replacement  and was admitted to the orthopedic floor.  His pain was well-controlled  throughout.  He remained neuromuscularly and vascularly intact  throughout.  The patient does have a history of sleep apnea, so was  placed on CPAP.  Pharmacy helped manage his Coumadin throughout his  course of stay.  When we saw him on postop day #1 he was awake, alert  and oriented.  He had some was low sats over the evening, but the pain  was well-controlled with the PCA.   Dressing was clean, dry and intact  bilaterally.  His Hemovac was pulled from his left knee.  He was  weightbearing as tolerated.  Went ahead and scheduled consult for [facility].  His initial INR was 1.1.  Initial PT evaluation was done  recommending rehab.  Inpatient rehab saw him and he did qualify with the  bilateral total knee replacements.  His creatinine was elevated after  postop day #1, acute renal failure, was seen by Dr. Lovell Sheehan for this.  All of his blood pressure medicines were stopped at that time.  He was  given some fluid boluses.  An ultrasound renal was scheduled.  [place]  saw him on June 29, 2006 for consult follow up.  On June 29, 2006  [facility] followed up.  Seen originally for acute renal  failure.  IV fluids were increased to 150 an hour, Lasix 40 mg given by  IV every 8 hours and we rechecked the B-MET.  We held the ACE, held the  CCB and were using Cipro for potential UTI.  Orthopedically he had made  slow progress.  This was due to the bilateral replacements as well as  with the renal issues.  By the time of his discharge, orthopedically he  was stable.  He had resolving acute renal failure.  He was stable and  ready for discharge to rehab.   DISCHARGE DISPOSITION:  Discharge stable and improved condition to rehab  with resolving acute renal failure.   Goals of physical therapy will be to minimize pain, increase range of  motion, maximize strength, work on gait training and proprioception.  The patient will be weightbearing as tolerated with the use of rolling  walker over the next several weeks at which point he will progress to a  single point cane.   DISCHARGE WOUND CARE:  Keep wound dry.  Change dressing on a daily  basis.   DISCHARGE DIET:  Regular as tolerated by the patient.   DISCHARGE MEDICATIONS:  1. Zoloft 150 mg 1 p.o. daily.  2. Lorazepam 0.5 mg 1 p.o. t.i.d. 3. Risperdal 0.5 mg 1 p.o. daily.  4. Lotrel 5/10 1 p.o. daily.  5.  Allopurinol 300 mg 1 p.o. daily.  6. Vitamin E 400 units 1 daily.  7. Zinc.  8. Folic acid  9. Psyllium.  10.Fish oil.  11.Coumadin per pharmacy recommendation to maintain INR  between 2-3.  12.Vicodin 5/325 1-2 p.o. q.4-6h. p.r.n. pain.  13.Robaxin 500 mg 1 p.o. q.6h. p.r.n. muscle spasm.   DISCHARGE FOLLOW-UP:  Dr. Charlann Boxer in 2 weeks.   DISCHARGE SPECIAL INSTRUCTIONS:  TED hose on 12, off 12.  If the patient  develops any acute shortness of breath, severe calf pain please call  emergency service immediately.     ______________________________  Yetta Glassman [address], [place]      Madlyn Frankel. Charlann Boxer, M.D.  Electronically Signed    BLM/MEDQ  D:  08/02/2006  T:  08/02/2006  Job:  [id number]

## 2010-08-19 NOTE — Op Note (Addendum)
[name], [name]             ACCOUNT NO.:  [id number]   MEDICAL RECORD NO.:  [id number]          PATIENT TYPE:  AMB   LOCATION:  SDS                          FACILITY:  [facility]   PHYSICIAN:  Almedia Balls. Ranell Patrick, M.D. DATE OF BIRTH:  1946/02/08   DATE OF PROCEDURE:  11/27/2005  DATE OF DISCHARGE:  11/27/2005                                 OPERATIVE REPORT   PREOPERATIVE DIAGNOSIS:  Right knee medial meniscus tear and chondromalacia.   POSTOPERATIVE DIAGNOSES:  1. Right knee medial meniscus tear.  2. 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  3. Right knee medial tibial condyle chondromalacia, grade 3/4.  4. Loose body in the joint.  5. 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   PROCEDURE PERFORMED:  Right knee arthroscopy with debridement of posterior  horn medial meniscus tear, abrasion chondroplasty, medial femoral condyle  and patellofemoral joint and medial tibial condyle, and removal of loose  body.   ATTENDING SURGEON:  Almedia Balls. Ranell Patrick, MD   ASSISTANTS:  None.   ANESTHESIA:  Masked general anesthesia was used.   ESTIMATED BLOOD LOSS:  Minimal.   FLUID REPLACEMENT:  1200 mL crystalloid.   INSTRUMENT COUNTS:  Correct.   COMPLICATIONS:  None.   PREOPERATIVE ANTIBIOTICS:  Given.   INDICATIONS:  The patient is a 65 year old male with history of worsening  right medial knee pain.  The patient has failed conservative management and  has a meniscus tear on clinical and MRI examination.  The patient presents  now for operative treatment to relieve pain and restore function.  Informed  consent was obtained.   DESCRIPTION OF PROCEDURE:  After an adequate level of anesthesia was  achieved, the patient was positioned supine on the operating room table.  A  lateral ____ QA MARKER: 64 ____ was u  drape of the right knee, we entered the knee arthroscopically using general  arthroscopic portals.  Diagnostic arthroscopy revealed significant  patellofemoral chondromalacia, grade 3.  This was central wear, most notably  in the trochlea.  We performed a chondroplasty in that compartment, removed  all loose flaps of tissue and fibrillation.  We had nice, smooth articular  cartilage when we finished.  There were some areas of grade 4 change,  primarily deep in the trochlea.  The medial and lateral gutters inspected,  free of loose bodies.  Medial compartment entered.  There was noted to be a  complex posterior horn medial meniscus tear.  This was debrided back to  stable meniscal tissue involving removing a majority of the posterior horn  of meniscus, approximately 60-70%.  The remainder of the medial meniscus was  probed and felt to be stable.  There was significant medial femoral condyle  chondromalacia, grade 3 and 4.  All loose flaps, cartilage and tissue were  removed.  The remaining cartilage was stable.  Again, there were some areas  of ____ QA MARKER: 118 ____ wear.  little better condition with grade 3 changes and fibrillation.  This was all  smoothed out using a chondroplasty technique with a motorized shaver.  ACL,  PCL intact.  A fairly sizeable, 2  x 3-mm loose body present, attached to the  anterior horn of lateral meniscus.  This was removed using pituitary  rongeur.  The remainder of lateral meniscus was normal, and the lateral  articular cartilage showed some mild, grade 2 changes; otherwise, normal.  Following the abrasion chondroplasty in the medial compartment and  patellofemoral compartment and the posterior horn medial meniscectomy and  the removal of loose body, we concluded surgery with the suturing of wounds  with 4-0 Monocryl, followed by Steri-Strips and sterile dressing.  The  patient was taken to the recovery room having tolerated surgery well.           ______________________________  Almedia Balls. Ranell Patrick, M.D.     SRN/MEDQ  D:  11/27/2005  T:  11/28/2005  Job:  [id number]

## 2010-08-19 NOTE — Consult Note (Signed)
[name], [name]                       ACCOUNT NO.:  [id number]   MEDICAL RECORD NO.:  [id number]                   PATIENT TYPE:  INP   LOCATION:  0101                                 [facility]AC[facility]  [facility]   PHYSICIAN:  Jamison Neighbor, M.D.               DATE OF BIRTH:  18-Jun-1945   DATE OF CONSULTATION:  04/03/2003  DATE OF DISCHARGE:                                   CONSULTATION   REFERRING PHYSICIAN:  Rhae Lerner. Margretta Ditty, M.D.   REASON FOR CONSULTATION:  Right ureteropelvic junction stone with  uncontrolled pain.   HISTORY:  This 65 year old male presented to the emergency room with right  flank pain.  The patient had a CT scan performed and was found to have an 11  mm stone at the right UPJ.  The patient's pain is significant, and he is  requiring Dilaudid.  He is going to require stent placement to eliminate his  pain.   PAST MEDICAL HISTORY:  Remarkable for hypertension.  The patient is  currently on Lotrel 5 mg.  The patient does have a past urologic history of  stones on one occasion in the past.  In addition, he has had an elevated PSA  and when biopsied by Dr. Aldean Ast, this was negative.   Medical history is otherwise quite unremarkable.  He has no other chronic  medical illnesses.  His only previous surgery was a cholecystectomy.   He has no known allergies.   Family history, social history, and review of systems are unremarkable.   PHYSICAL EXAMINATION:  GENERAL:  On examination today the patient's exam is  remarkable for some right-sided flank pain.  No masses, rebound, or guarding  could be seen.  The patient is very large in size, and he was apprised of  the fact that it may be difficult for him to undergo ESWL.  He is very close  to 300 pounds in size.  GENITOURINARY:  Unremarkable.  His testicles are normal in size, shape, and  consistency, with no hydrocele, spermatocele, varicocele, hernia, or  adenopathy.  RECTAL:  Not performed.  EXTREMITIES:  No  clubbing, cyanosis, or edema.  Neurologic and vascular systems appeared intact.   CT scan shows some right hydronephrosis secondary to a right UPJ stone.  There also appears to be a cystic structure on the left kidney.   IMPRESSION:  Right ureteropelvic junction stone with pain.   PLAN:  Stent placement, followed by ESWL.                                               Jamison Neighbor, M.D.    RJE/MEDQ  D:  04/03/2003  T:  04/03/2003  Job:  [id number]   cc:   Rhae Lerner. Margretta Ditty, M.D.  [address]  [place]  [place] [address]   Londell Moh Shelle Iron., M.D.  [address]  [place]  [place] [address]  Fax: [phone]

## 2010-08-19 NOTE — H&P (Signed)
NAMESLOAN, TAKAGI             ACCOUNT NO.:  [id number]   MEDICAL RECORD NO.:  [id number]          PATIENT TYPE:  IPS   LOCATION:  4011                         FACILITY:  [facility]   PHYSICIAN:  Ranelle Oyster, M.D.DATE OF BIRTH:  1945-05-11   DATE OF ADMISSION:  06/29/2006  DATE OF DISCHARGE:                              HISTORY & PHYSICAL   CHIEF COMPLAINT:  Bilateral knee pain.   HISTORY OF PRESENT ILLNESS:  This is a 65 year old obese white male  admitted to [facility] with progressive knee pain for 5+ years  failing conservative measures.  The patient has developed end-stage  osteoarthritis.  He underwent bilateral total knee replacements on June 25, 2006 by Dr. Charlann Boxer.  He is placed on Coumadin for DVT prophylaxis.  Postoperatively, his course was complicated by increased creatinine to  4.  This was treated with aggressive fluid resuscitation.  Most recent  creatinine as of today was 2.0.  Renal workup was negative for  hydronephrosis.  Lotrel and allopurinol were held until improvement of  his renal function occurred.  Medicine has been following the patient  for these ongoing issues.  He has had moderate postoperative anemia with  most recent hemoglobin 8.9.  The patient's Norvasc and allopurinol were  resumed today.  The patient continues to suffer from functional deficits  related to his knee surgery and osteoarthritis in the setting of his  other medical issues and was brought to the rehab unit today.   REVIEW OF SYSTEMS:  Notable for reflux, low back pain, anxiety,  depression.  He had his Foley catheter removed today.  He has been  voiding thus far.  Denies constipation.  Other pertinent positives  listed above and full review is in the written history and physical.   PAST MEDICAL HISTORY:  1. Positive sleep apnea on CPAP.  2. Depression/anxiety.  3. Hypertension.  4. Diverticulitis.  5. Left carpal tunnel release.  6. History of kidney stone.  7.  Gout.  8. Partial right index finger amputation.  9. Cholecystectomy.   FAMILY HISTORY:  Positive for CAD.   SOCIAL HISTORY:  The patient lives with his wife in [place].  He is  currently on disability for depression.  His wife works.  He has an  elderly mother who can assist him on a limited basis.  The patient was  independent with his cane prior to arrival.   ALLERGIES:  PENICILLIN, CODEINE, AND DARVOCET.   HOME MEDICATIONS:  1. Risperdal 0.5 mg at bedtime.  2. Lorazepam 0.5 mg 3 tablets daily.  3. Zoloft 100 mg one and one half daily.  4. Allopurinol 300 mg at bedtime.  5. Lotrel 5/10 daily.  6. Diclofenac 50 mg at bedtime  7. Folic acid 400 mg daily.  8. Multivitamin daily.   LABORATORIES:  Hemoglobin 8.9, sodium 139, potassium 3.6, BUN and  creatinine 25 and 2.1, INR today 3.5.   PHYSICAL EXAMINATION:  VITAL SIGNS:  Blood pressure 140/76, pulse 85,  respiratory rate 20, temperature 98.3.  Patient saturating 97% on room  air.  GENERAL:  Patient is obese in no  acute distress.  He is lying  comfortably in bed.  EAR/NOSE AND THROAT:  Exam is unremarkable with fair to good dentition.  Oral mucosa is pink and moist.  NECK:  Supple without JVD or lymphadenopathy.  CHEST:  Clear to auscultation bilaterally without any obvious wheezes,  rales, or rhonchi.  HEART:  Regular rate and rhythm without murmurs, rubs, or gallops.  ABDOMEN:  Obese, nontender.  Bowel sounds are positive.  SKIN:  Skin was notable for bilateral knee incisions which are clean,  dry, and intact.  The patient was tolerating CPM on the right side  without issue from zero to 40 degrees.  EXTREMITIES:  Showed 2+ edema below the surgical sites.  Upper  extremities have no edema.  NEUROLOGICALLY:  Cranial nerves II-XII are grossly intact.  Reflexes  were 1+ to 2+ bilaterally.  Sensation essentially was within normal  limits except for the edema associated lower extremity areas which had  diminished  pinprick.  Memory, mood and affect were all appropriate  today.  Motor function was 5/5 in the upper extremities.  He had 4/5  strength at the ankles.  Knees were not tested.  Hip strength was 1+ to  2/5.   ASSESSMENT AND PLAN:  1. Functional deficits related to bilateral osteoarthritis of the      knees status post bilateral total knee replacement:  Begin      comprehensive inpatient rehab with physical therapy to assess and      treat for range of motion strengthening, bed mobility, transfers      and gait.  Occupational therapy will assess and treat with range of      motion, strengthening, activities of daily living and equipment.      Rehab nurse will follow for bowel, bladder, skin and medication      issues as well as pain management.  Case manager/social worker will      assess for psychosocial needs and discharge planning.  Estimated      length of stay is 7+ days.  Prognosis good.  Goals modified      independent supervision.  2. Acute renal failure:  Continue to monitor electrolytes and renal      function.  Push oral intake.  3. Hypertension:  Continue Norvasc.  Blood pressure currently under      control.  4. Mood:  The patient maintains on Risperdal, Ativan, and Zoloft.  5. Postoperative anemia:  Check admission CBC.  Continue iron      supplementation.  6. Gout:  Allopurinol resumed today for prophylaxis.  7. Sleep apnea:  Maintain patient on continuous positive airway      pressure.  Monitor oxygen saturations.  8. Deep vein thrombosis prophylaxis:  Continue Coumadin.  Hold today      due to increased INR.  9. Pain management with Tylenol and Vicodin as needed.  Also encourage      ice as needed.      Ranelle Oyster, M.D.  Electronically Signed     ZTS/MEDQ  D:  06/29/2006  T:  06/29/2006  Job:  [id number]

## 2010-08-19 NOTE — Letter (Signed)
June 20, 2006    Via facsimile - [phone]   Almedia Balls. Ranell Patrick, M.D.  [facility]  [address]  [address]  [place], [place]  [address]   RE:  [name], [name]  MRN:  [id number]  /  DOB:  1945/05/04   Dear Dr. Ranell Patrick:   Thank you for referring [name]) for preoperative  evaluation.  He is scheduled for total knee replacement on June 25, 2006.  He states that, I am to the point where the  operative pain  cannot get any worse than what I am experiencing now.  He has  difficulty even getting out of bed and cannot stand for longer than one  hour.  The pain is worse attempting to climb stairs.   Despite the arthroscopy in August 2007, there have been no improvement  in the symptoms.   He has had major surgery in the past, specifically, cholecystectomy.  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  He has had no  perioperative complications in the past.  Synvisc and cortisone  injections have failed to result in improvement in his symptoms.   On exam, the most striking finding is his metabolic syndrome/morbid  obesity appearance.  He has gained approximately two pounds to 318.  Vital signs were good with a pulse of 60, respirations 20, blood  pressure 102/68.  He had a grade 1-1/2 systolic murmur.  Exercise in the  office was not attempted.  Abdomen is protuberant but not tender.   Fusiform enlargement of the knees was present as well as crepitus.   A rest stress test was performed on June 13, 2006.  This did not show  any significant EKG changes or evidence of ischemia.   Although I would prefer to have [name] comply with diet and nutrition  recommendations and lose a significant amount of weight before the  surgery, that is unlikely to happen.  Reluctantly, I clear him for  surgery, although, I do feel he is at markedly increased risk due to his  morbid obesity.  My  concerns and these risks were discussed with him  frankly.   He continues CPAP with good response.  I will recommend a Statin because  of his LDL of 184.  Presently, this value will be associated with a  moderately high risk of premature heart attack or stroke at 16%.  Surprisingly, his A1c is 5.8, which is compatible with average sugar of  128 and a 16% cardiovascular risk.  He has been given printed  information on the risks of metabolic syndrome.   Should medical problems arise during the hospitalization, the [facility] service can be consulted.    Sincerely,      [name] Dubin. Alwyn Ren, MD,FACP,FCCP  Electronically Signed    [facility]/MedQ  DD: 06/20/2006  DT: 06/20/2006  Job #: [id number]

## 2010-08-19 NOTE — Op Note (Signed)
NAMEOSWALD, POTT             ACCOUNT NO.:  [id number]   MEDICAL RECORD NO.:  [id number]          PATIENT TYPE:  INP   LOCATION:  X009                         FACILITY:  [facility]   PHYSICIAN:  Madlyn Frankel. Charlann Boxer, M.D.  DATE OF BIRTH:  Sep 24, 1945   DATE OF PROCEDURE:  06/25/2006  DATE OF DISCHARGE:                               OPERATIVE REPORT   PREOPERATIVE DIAGNOSIS:  Bilateral knee osteoarthritis.   POSTOPERATIVE DIAGNOSIS:  Bilateral knee osteoarthritis.   PROCEDURE:  Bilateral total knee replacement.   COMPONENTS USED:  For both knees:  DePuy rotating platform, posterior  stabilized knee system with a size 5 femur, 5 tibia, 10 mm insert and a  41 patellar button on both knees.   SURGEON:  Madlyn Frankel. Charlann Boxer, M.D.   ASSISTANT:  Yetta Glassman. Mann, PA   ANESTHESIA:  Dermal spinal.   BLOOD LOSS:  200 mL total.   DRAINS:  One on the left knee.   COMPLICATIONS:  None.   INDICATIONS:  Mr. Lacek is a pleasant 65 year old gentleman who was  sent over for evaluation of bilateral knee osteoarthritis.  He had  progressive discomfort that failed conservative measures, including  surgical procedures in the past.  No injections or medications were  providing any relief.   After reviewing with him his current situation, he wished to proceed  with bilateral total knee replacement.  I discussed the increased risk  of co-morbidities of renal cardiac issues, as well as increased risk of  DVT and pneumonia.  In addition to the standard risks of infection,  dislocation of the component and component failure, stiffness and  persistent pain postoperatively.  Given these risks, he consented for  the procedure.   PROCEDURE IN DETAIL:  The patient was brought to operative theater.   Please note this will be dictated as a single operative note, but will  be 2 separate procedures.  Once adequate anesthesia and preoperative  antibiotics (1 gram of vancomycin) were administered, the patient  was  positioned supine.  Proximal thigh tourniquets were placed on both  sides.   At this point both lower extremities were prescrubbed and prepped and  draped in a sterile fashion, with a 2-hole extremity drape.   Attention was first directed to the left knee.  The left knee was as  follows.  The leg was exsanguinated and tourniquet elevated.  Midline  incision was made, followed by medial parapatellar arthrotomy with  patella subluxation rather than eversion.  Knee exposure obtained in  routine fashion, including proximal medial peel.   Following knee exposure, I attended to the patella.  Precut measurement  was 27 millimeters.  I resected down to 15 mm and used a 41 patellar  button.  This fit nicely.  I drilled holes and put a shim in its place  to protect the patella against retractors.   At this point, attention was first directed to the femur.  The drill was  inserted into the femur.  I  irrigated to prevent fat emboli.  An  intramedullary rod was then passed.  At 5 degrees of valgus, 10 mm of  bone was resected off the distal femur.   I then sized the femur to be a size 5.  On this left knee there was a  slight bit of neutral rotation, so I did elevate the medial side to  increase the rotation to be perpendicular to Whiteside's line; and more  closely approximate the epicondylar axis.  The size 5 anterior-posterior  chamfer cuts were then made without notching or complication.  The final  trochlear box cut was made, based off the lateral aspect of the distal  femur.   With these cuts made and debridements of the medial and lateral aspects  of the femur and osteophytes carried out, attention was directed to the  tibia.  Tibia exposure was obtained in routine fashion with a retractor  placement.  With an extramedullary guide and perpendicular tibial shaft,  I then resected 10 mm of bone off the proximal lateral tibia.  This  amounted to about 2-4 mm cut medially.  This bone  was resected and  measured to be a size 5.  Following this cut, I checked my cut with an  extension block to make sure the knee came out to full extension -- and  it did.  At this point the pins were removed and final tibial  preparation carried out.  With the tibial component in correct  orientation, it was drilled and keel punched.   I then did a trial reduction with size 5 femur, 5 tibia and a 10-mm  insert.  The knee came out to full extension and was stable.  The  patella tracked without application of pressure.   All trial components were removed.  I injected the knee with 60 mL of  0.25% Marcaine with epinephrine.   I then irrigated the knee out thoroughly, while components were opened.  Cement was mixed.  The components were cemented in position, with the  tibia first, then femur, and then the patella.  The leg was brought to  full extension with a 10 mm insert in place to allow the cement to cure.  Excessive cement was debrided at this point, and then once the cement  cured in the posterior aspect, the knee was cleared up.  Once I was  satisfied there was no visible cement anywhere, the final 10 mm insert  was placed.   At this point, the tourniquet was let down at 55 minutes from 300 mmHg.  At this point, I did use some FloSeal in the joints to try and help with  hemostasis.   A medium Hemovac drain was placed on this side.  I then brought the knee  to flexion, and with a #1 Vicryl reapproximated the extensor mechanism.  I then reapproximated the subcutaneous layer with 2-0 Vicryl and 4-0  running Monocryl on the skin.  The skin was cleaned, dried and dressed  sterilely with Steri-Strips, dressing sponges and, at this point, a  [place] Ace wrap.   Following this, attention was now directed to the right knee.  The right  knee was exsanguinated, tourniquet elevated.  A midline incision was made, followed by a medial parapatellar arthrotomy.  Knee exposure  obtained in  routine fashion.  Attention was first directed to the  patella.  Precut measurement was again 27 mm; I resected down to 15 mm  and used a 41 button.  Again, it was drilled and patella shim placed.  Following further knee exposure and debridement of the meniscus,  attention was directed to the  femur.  The femur was then drilled and at  5 degrees valgus for a right knee at this point, I resected 10 mm of  bone.  Osteophytes were debrided.  I sized the femur to be a size 5,  similar to the left side.  With the posterior condylar axis at 3  degrees, this more closely approximated the external rotation to  Whiteside's line, and no changes were made.  The anterior, posterior and  chamfer cuts were then made without complication.  The final trochlear  box cut was then made off the lateral aspect of the distal femur.  Following debridement of osteophytes along the medial and lateral aspect  of the knee, attention was directed to the tibia.  Tibia exposure was  obtained in routine fashion.  Retractors were placed.  Using  extramedullary guide, again I resected 10 mm of bone off the proximal  lateral tibia.  This cut surface also measured to 5.   The 10 mm bone cut was made.  The knee was brought out to extension,  with extension guide noting that it came to full extension.   Following this, the final tibial preparation was carried out with  drilling and keel punching.  I checked the alignment and was happy that  it passed through the center of the ankle in AP and lateral planes.   At this point, a trial reduction was carried out with a 5 femur and 10  mm insert.  The knee came out to full extension and was stable through  flexion.  The patella tracked without application of pressure.   At this point, final components were brought onto the field at this  side.  The trial components were removed.  I irrigated the knee out with  the pulse lavage normal saline solution.  No injection was used on  this  side, as I used  6 mL on the left side.   At this point, the cement was mixed and the components were cemented  into position -- first the tibia, then femur, and then the patella.  The  knee was brought out to extension to allow the cement to cure.  Once the  cement had cured, excessive cement was debrided.  I exposed the  posterior aspect of the knee and debrided the cement there.  Once I was  satisfied there was no visualized cement anywhere, the final  polyethylene was placed.  Again, the knee was irrigated.  The tourniquet  was let down at 55 minutes ( similar to the left side).  No drain was  used on this side.  I did use FloSeal on the posterior aspect of the  knee, and then laterally and superiorly.   The knee was then brought to flexion and the wound closed with #1 Vicryl  in the extensor mechanism.  Then 2-0 Vicryl was used in the subcutaneous layer, followed by running 4-0 Monocryl.  The knee was then cleaned,  dried and dressed sterilely with Steri-Strips.  Both knees at this point  was dressed into sterile bulky wraps, with Adaptic dressing sponges,  cast padding and Ace wraps.  The patient was then brought to recovery  room in stable condition, as he was not intubated.      Madlyn Frankel Charlann Boxer, M.D.  Electronically Signed     MDO/MEDQ  D:  06/25/2006  T:  06/25/2006  Job:  [id number]

## 2010-08-19 NOTE — Procedures (Signed)
[name], [name]             ACCOUNT NO.:  [id number]   MEDICAL RECORD NO.:  [id number]          PATIENT TYPE:  OUT   LOCATION:  SLEEP CENTER                 FACILITY:  [facility]   PHYSICIAN:  Marcelyn Bruins, M.D. [facility] DATE OF BIRTH:  [birth year]   DATE OF STUDY:  10/25/2005                              NOCTURNAL POLYSOMNOGRAM   REFERRING PHYSICIAN:  Dr. Lona Kettle.   DATE OF STUDY:  October 25, 2005.   INDICATION FOR THE STUDY:  Hypersomnia with sleep apnea.  Epworth score:  11.   SLEEP ARCHITECTURE:  The patient had a total sleep time of 317 minutes but  never achieved slow wave sleep or REM.  Sleep onset latency was mildly  prolonged at 34 minutes.  Sleep efficiency was decreased at 84%.   RESPIRATORY DATA:  The patient was found to have 119 obstructive hypopneas,  120 obstructive apneas, and 12 central apneas.  This gave him a Respiratory  Disturbance Index during the first half of the night at 79 events per hour.  There was moderate snoring, and the events were not positional, by protocol  the patient was then placed on a large ResMed Quattro full-face mask and  ultimately titrated to 16 cm H2O pressure with excellent control of both  obstructive events and snoring.   OXYGEN DATA:  There was O2 desaturation as low as 80% with the patient's  obstructive events.   CARDIAC DATA:  No clinically significant cardiac arrhythmias.   MOVEMENT/PARASOMNIA:  The patient was found to have 464 leg jerks with 13  per hour resulting in arousal or awakening.   IMPRESSION/RECOMMENDATION:  1.  Split night study reveals severe obstructive sleep apnea with a      Respiratory Disturbance Index of 79 events per hour and oxygen      desaturation as low as 80% during the first half of the night.  The      patient was then placed on continuous positive airway pressure with a      large ResMed Quattro full-face mask and ultimately titrated to 16 cm H2O      with excellent control of the patient's  events.  2.  Large numbers of leg jerks with what appears to be significant sleep      disruption.  It is unclear whether this is related to the patient's      sleep-disordered breathing, or whether the patient may have a      concomitant primary      movement disorder of sleep.  Clinical correlation is suggested if the      patient fails to respond appropriately to optimal continuous positive      airway pressure therapy.           ______________________________  Marcelyn Bruins, M.D. [facility]  Diplomate, American Board of Sleep  Medicine     KC/MEDQ  D:  11/03/2005 16:17:03  T:  11/03/2005 22:25:50  Job:  [id number]

## 2010-08-19 NOTE — Op Note (Signed)
[name], [name]                       ACCOUNT NO.:  [id number]   MEDICAL RECORD NO.:  [id number]                   PATIENT TYPE:  INP   LOCATION:  0101                                 [facility]AC[facility]   PHYSICIAN:  Jamison Neighbor, M.D.               DATE OF BIRTH:  1945-09-28   DATE OF PROCEDURE:  04/03/2003  DATE OF DISCHARGE:                                 OPERATIVE REPORT   PREOPERATIVE DIAGNOSIS:  Right ureteropelvic junction calculus.   POSTOPERATIVE DIAGNOSIS:  Right ureteropelvic junction calculus.   PROCEDURES:  1. Cystoscopy.  2. Right retrograde.  3. Right double J catheter insertion.   SURGEON:  Jamison Neighbor, M.D.   ANESTHESIA:  General.   COMPLICATIONS:  None.   DRAINS:  [name] 6 French x 26 cm double J catheter.   HISTORY:  This 65 year old male presented to the emergency room on April 02, 2003, and was found on CT scan to have an 11 mm stone on the right-hand  side.  The patient did not respond well to pain management, and it is felt  that he will require stenting in order to eliminate his pain.  The patient  will eventually require ESWL.  He understands the risks and benefits of the  procedure and gave informed consent.   DESCRIPTION OF PROCEDURE:  After successful induction of general anesthesia,  the patient was placed in the dorsal lithotomy position and prepped with  Betadine and draped in the usual sterile fashion.  Cystoscopy was performed.  The urethra was visualized in its entirety and was found to be normal.  Beyond the verumontanum the patient had moderate prostatic enlargement but  no real obstruction.  The bladder itself was carefully inspected.  It was  free of any tumor or stones.  Both ureteral orifices were normal in  configuration and location.  The right ureteral orifice was cannulated and [name]  retrograde study was performed.  It was somewhat difficult to visualize on  fluoroscopy as there had been some technical issues with the  fluoroscope,  but plain films did demonstrate that the patient had [name] normal-caliber ureter  with no filling defects seen.  There was hydronephrosis present secondary to  what appeared to be [name] stone at the UPJ.  The guidewire was passed beyond  that point and [name] 6 French x 26 cm double J was passed over the wire and  allowed to coil normally in that region.  The other coil was appropriately  located within the bladder.  The bladder was drained.  The patient tolerated  the procedure well and was taken to the recovery room in good condition.                                               Molly Maduro  Danne [place], M.D.   RJE/MEDQ  D:  04/03/2003  T:  04/03/2003  Job:  [id number]

## 2010-08-19 NOTE — H&P (Signed)
[name], [name]             ACCOUNT NO.:  [id number]   MEDICAL RECORD NO.:  [id number]          PATIENT TYPE:  INP   LOCATION:  NA                           FACILITY:  [facility]   PHYSICIAN:  Madlyn Frankel. Charlann Boxer, M.D.  DATE OF BIRTH:  March 04, 1946   DATE OF ADMISSION:  06/25/2006  DATE OF DISCHARGE:                              HISTORY & PHYSICAL   PROCEDURE:  Bilateral total knee arthroplasty.   CHIEF COMPLAINT:  Bilateral knee pain.   HISTORY OF PRESENT ILLNESS:  [name] is a very pleasant 65 year old  gentleman, referred to [place] from Dr. Dietrich Pates office.  He has had  persistent progressive pain over the past 5-6 years.  He has had  previous arthroscopic intervention.  He has had failed injections and  viscosupplementations.  He has been refractory to all treatment methods.  He has been cleared presurgical by his medical doctor, Dr. Marga Melnick.  He is scheduled for bilateral total knee replacements.   PAST MEDICAL HISTORY:  1. Depression.  2. Hypertension.  3. Diverticulitis.  4. Osteoarthritis.   PAST SURGICAL HISTORIES:  1. Partial right index finger amputation.  2. Dental surgery with wisdom teeth removed in 1970.  3. Cholecystectomy, 1989.  4. Carpal tunnel trigger finger surgery, left wrist, 1999.  5. Kidney stone removal with lithotripsy in 2005.  6. Right knee arthroscopy, 2007.   PLAN:  Returning home afterwards for home health care, physical therapy  and treatment.   DRUG ALLERGIES:  PENICILLIN, DARVOCET and CODEINE.   MEDICATIONS:  1. Diclofenac 50 mg one p.o. daily p.r.n.,  2. Zoloft 150 mg one p.o. daily.  3. Lorazepam 0.5 mg one p.o. t.i.d.  4. Risperdal 0.5 mg one p.o. daily.  5. Lotrel 5/10 mg one p.o. daily.  6. Allopurinol 300 mg one p.o. daily.  7. Vitamin E 400 units one p.o. daily.  8. Zinc 50 mg one daily.  9. Folic acid 400 mcg one daily.  10.Selenium 100 mcg one daily.  11.Fish oil 1200 mg one daily.  12.____________  1200 mg one  daily.  13.Centrum multivitamin one daily.  14.Aspirin 81 mg one daily.  15.Tylenol Arthritis 650 mg one daily.   REVIEW OF SYSTEMS:  None other than in HPI.   PHYSICAL EXAM:  VITAL SIGNS:  Pulse 72, respirations 18, blood pressure  138/74.  GENERAL:  Awake, alert and oriented, well-developed, well-nourished, in  acute distress.  NECK:  Supple.  No carotid bruits.  CHEST:  Lungs are clear to auscultation bilaterally.  BREASTS:  Deferred.  HEART:  Regular rate and rhythm without gallops, clicks, rubs or  murmurs.  ABDOMEN:  Soft, nontender and nondistended.  Bowel sounds present.  GENITOURINARY:  Deferred.  EXTREMITIES:  Near full extension bilaterally.  He flexes at this point  about 110 degrees bilaterally.  PERIPHERAL VASCULAR:  He has palpable dorsalis pedis pulses.  NEUROLOGIC:  Intact distal sensibilities.   LABORATORY AND ACCESSORY CLINICAL DATA:  Labs, EKG and chest x-ray  pending presurgical testing, June 18, 2006.   IMPRESSION:  Bilateral knee osteoarthritis.   PLAN OF ACTION:  Bilateral total knee  arthroplasties, right and left, on  June 25, 2006 at [facility] at 12:15 p.m. by surgeon, Dr.  Durene Romans.  Questions were encouraged, answered and reviewed.  Risks  and complications were discussed.     ______________________________  Yetta Glassman [address], [place]      Madlyn Frankel. Charlann Boxer, M.D.  Electronically Signed    BLM/MEDQ  D:  06/20/2006  T:  06/21/2006  Job:  [id number]

## 2010-08-19 NOTE — Op Note (Signed)
[place]  Patient:    [name], [name]                    MRN: [id number] Proc. Date: 12/23/99 Adm. Date:  [id number] Disc. Date: [id number] Attending:  Ronne Binning CC:         Nicki Reaper, M.D.   Operative Report  PREOPERATIVE DIAGNOSIS: 1. Carpal tunnel syndrome, left hand. 2. Tenosynovitis to extensor tendon, left hand.  POSTOPERATIVE DIAGNOSIS: 1. Carpal tunnel syndrome, left hand. 2. Tenosynovitis to extensor tendon, left hand.  OPERATION:  Carpal tunnel release and tenosynovectomy of extensor tendon, left hand.  SURGEON:  Nicki Reaper, M.D.  ASSISTANT:  Joaquin Courts, R.N.  ANESTHESIA:  Axillary block  ANESTHESIOLOGIST:  Halford Decamp, M.D.  HISTORY:  The patient is a 65 year old male with a history of carpal tunnel syndrome triggering off his extensors on his left hand.  DESCRIPTION OF PROCEDURE:  The patient was brought to the operating room where an axillary block was carried out without difficultly.  He was prepped and draped using Betadine scrub and solution with left arm free.  The limb was exsanguinated with an Esmarch bandage.  Tourniquet placed high on the arm was inflated to 250 mmHg.  A longitudinal palmar incision was made and carried down through subcutaneous tissue.  Bleeders were electrocauterized.  Palmar fascia was split, superficial palmar arch identified.  The flexor tendon to the ring and little finger identified to the ulnar side of the median nerve. The carpal retinaculum was incised with sharp dissection.  A right angle and Sewall retractor were placed between skin and forearm fascia and the fascia released for approximately 5 cm proximal to the wrist crease under direct vision.  The canal was explored.  Tenosynovial tissue was moderately thickened.  No further lesions were identified.  The wound was irrigated.  The skin was closed with interrupted 5-0 nylon sutures.  A separate incision  was then made dorsally longitudinally over the extensor retinaculum to the level of the metacarpal bases and onto the distal forearm, carried down through subcutaneous tissue.  Bleeders were again electrocauterized.  The dissection was carried to the extensor tendons distal to the retinaculum.  This area was opened.  A very significant tenosynovitis was present.  With blunt and sharp dissection, this was dissected free.  There was found to be infiltration into the extensor tendon primarily to the ring and middle finger.  This was dissected free and removed.  The tendons were debrided.  They were intact.  An incision was then made proximal to the retinaculum, and a completion of the tenosynovectomy performed proximally.  Care was taken to protect the tendons with full flexion and full extension.  The entire tendons were able to be relieved of their tenosynovial tissue.  The wound was copiously irrigated with saline.  The subcutaneous tissue was closed with interrupted 4-0 Vicryl and skin with subcuticular 4-0 Monocryl suture.  Steri-Strips were applied.  A sterile compressive dressing and splint was applied.  The patient tolerated the procedure well and was taken to the recovery room for observation in satisfactory condition.  The patient is discharged home to return to the [facility] in [place] in one week on Talwin NX and Septra DS. DD:  12/23/99 TD:  12/25/99 Job: [id number]

## 2010-08-19 NOTE — Letter (Signed)
October 24, 2005     Almedia Balls. Ranell Patrick, MD  [facility]  [address]  [address]  [place], [place] [address]   RE:  [name], [name]  MRN:  [id number]  /  DOB:  03/01/1946   Dear Dr. Ranell Patrick,   Thank you for sending [name] for preoperative clearance.  He states that he  is to have arthroscopy on the right knee because of pain unresponsive to  glucosamine and diclofenac.  He states that the arthroscopy will be done  under local once he is cleared for surgery.   Significant past history reveals that he had low pulse oximetry readings at  the time of the colonoscopy in 2001.   Recently, he was visiting his son in [place], and his son noted dramatic  snoring as well as frank apnea.   He continues to see Dr. Milford Cage, psychiatrist at [facility], and  states, a few problems remain, but overall, he feels he is stable.   He has not had any perioperative complications with his carpal tunnel  surgery, renal calculi, cholecystectomy, tonsillectomy, or oral surgery.   Despite his metabolic syndrome, he has not implemented carbohydrate  restriction and avoidance of high fructose corn syrup, as we had discussed  because of his metabolic issues, which put him at high risk long term for  diabetes.   The oral airway was patent without redundant tissue.  He does have the  physical stigmata of a metabolic syndrome.  Abdomen is protuberant without  organomegaly or masses.  Pedal pulses are decreased.  He has trace edema.  There is marked crepitus of the knees with some pain.   His EKG was normal.   He will need pulse oximetry and perioperative telemetry because of the  question of sleep apnea with desaturations.  A sleep study will be pursued.  I defer to you as to whether he would want to wait for the results of this  prior to scheduling surgery, if it is indeed going to be done under local as  opposed to general anesthesia.  Were general anesthesia necessary, then a  sleep  study should be done preoperatively.    Sincerely,      Titus Dubin. Alwyn Ren, MD, [facility]   WFH/MedQ  DD:  10/24/2005  DT:  10/24/2005  Job #:  [id number]
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
## 2010-08-19 NOTE — Discharge Summary (Signed)
NAMEDAMARIAN, PRIOLA             ACCOUNT NO.:  [id number]   MEDICAL RECORD NO.:  [id number]          PATIENT TYPE:  IPS   LOCATION:  4011                         FACILITY:  [facility]   PHYSICIAN:  Ranelle Oyster, M.D.DATE OF BIRTH:  11/27/1945   DATE OF ADMISSION:  06/29/2006  DATE OF DISCHARGE:  07/04/2006                               DISCHARGE SUMMARY   DISCHARGE DIAGNOSES:  1. 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  2. Pain control.  3. Acute renal failure, resolved.  4. Hypertension.  5. Depression with anxiety.  6. Postoperative anemia.  7. Gout.  8. Sleep apnea.  9. Coumadin for deep vein thrombosis prophylaxis.   This is a 65 year old white male admitted to [facility] with  progressive bilateral knee pain for 5-6 years and no relief with  conservative care.  X-rays of end-stage degenerative joint disease.  Underwent bilateral total knee replacement on June 25, 2006 per Dr.  Charlann Boxer.  Placed on Coumadin for deep vein thrombosis prophylaxis.  Weightbearing as tolerated.  Postoperative pain control with Vicodin as  needed.  Monitoring of renal function with creatinine 1.09  preoperatively; increased to 4.04 on June 26, 2006, improved to 2.05  with intravenous fluids.  Renal ultrasound was negative for  hydronephrosis.  His allopurinol was held for a short time, as well as  his Lotrel, and his Norvasc had since recently been resumed after renal  function improved.  Postoperative anemia 8.9 and monitored.   PAST MEDICAL HISTORY:  See discharge diagnoses.   ALLERGIES:  1. PENICILLIN.  2. CODEINE.  3. DARVOCET.   SOCIAL HISTORY:  Lives with his wife in [place].  The patient is on  disability for depression.  Wife works day shift.  Elderly mother in  area can assist on discharge.   MEDICATIONS PRIOR TO ADMISSION:  1. Risperdal 0.5 mg at bedtime.  2. Lorazepam 0.5 mg 3 tablets daily.  3. Zoloft 100  mg 1-1/2 tablets daily.  4. Allopurinol 300 mg at bedtime.  5. Lotrel 5-10 daily.  6. Diclofenac 50 mg at bedtime.  7. Folic acid 400 mg daily.  8. Multivitamin daily.   REHABILITATION HOSPITAL COURSE:  The patient with progressive gains  while on rehab services with therapies initiated on a 3-hour daily basis  consisting of physical therapy, occupational therapy, rehabilitation  nursing.  The following issues were addressed during the patient's  rehabilitation stay.  Pertaining to Mr. Koeppen bilateral total knee  replacement of June 25, 2006, Steri-Strips remained in place.  He did  have some mild swelling that did improve with the use of ice packs.  Neurovascular sensation intact.  He was using Vicodin for pain control.  He was using his Vicodin one half to 1 hour prior to his therapies.  Blood pressures were controlled on Norvasc.  His Lotrel had since been  resumed after recently being on hold for some renal insufficiency with  the latest creatinine of 1.17 on July 02, 2006.  He had a long  documented history of depression with anxiety.  He had been seen by Dr.  Milford Cage of the psychiatry service in the past.  He remained on his  Risperdal, Ativan and Zoloft.  During his hospital course, his anxiety  remained well controlled.  His mood and spirits were upbeat.  Emotional  support was provided.  Postoperative anemia again was stable with latest  hemoglobin of 9.6, hematocrit of 28.  He remained on an iron supplement.  He was using his CPAP for sleep apnea, again as prior to hospital  admission.  He remained on Coumadin for deep vein thrombosis prophylaxis  with the latest INR of 2.4 on July 04, 2006.  He would be followed by  [facility].  Overall, he was ambulating household  functional distances with a walker.  He was minimal assist for stairs,  but he would be staying in his mother's home where he had no stairs to  navigate.  At the time of his discharge,  home health therapies again  would be ongoing.  Full family teaching was completed.  All issues were  discussed with his wife and patient.  Insurance remained updated  concerning his functional mobility.   DISCHARGE MEDICATIONS AT THE TIME OF DICTATION:  1. Coumadin with latest dose of 5 mg to be completed on July 26, 2006.  2. Zyloprim 300 mg at bedtime.  3. Lotrel 5-10 daily.  4. Trinsicon 1 capsule twice daily.  5. Folic acid 1 mg daily.  6. Ativan 1 mg daily.  7. Risperdal 0.5 mg at bedtime.  8. Zoloft 150 mg daily.  9. Vitamin E 400 units daily.  10.Vicodin 5/325 mg 1-2 tablets every 4 hours as needed for pain,      dispense of 90 tablets.   ACTIVITY:  Weightbearing as tolerated.   DIET:  Regular.   WOUND CARE:  Cleanse incision daily with soap and water.  Call for any  increased redness, drainage or fever to Dr. Charlann Boxer, [phone].  Home health  nurse per [facility] to check prothrombin time on  Friday, July 06, 2006 to complete Coumadin protocol.   FOLLOWUP:  Follow up with Dr. Alwyn Ren for medical management and Dr.  Charlann Boxer, orthopedic services.      Mariam Dollar, P.A.      Ranelle Oyster, M.D.  Electronically Signed    DA/MEDQ  D:  07/04/2006  T:  07/04/2006  Job:  [id number]   cc:   Dr. Tamala Fothergill D. Charlann Boxer, M.D.  Jamison Neighbor, M.D.

## 2010-09-07 ENCOUNTER — Encounter (HOSPITAL_COMMUNITY): Payer: Medicare Other | Admitting: Psychiatry

## 2010-09-07 DIAGNOSIS — F331 Major depressive disorder, recurrent, moderate: Secondary | ICD-10-CM

## 2010-10-30 ENCOUNTER — Other Ambulatory Visit: Payer: Self-pay | Admitting: Internal Medicine

## 2010-10-31 NOTE — Telephone Encounter (Signed)
Patient needs to schedule CPX for 11/2010

## 2010-11-06 ENCOUNTER — Other Ambulatory Visit: Payer: Self-pay | Admitting: Internal Medicine

## 2010-11-07 NOTE — Telephone Encounter (Signed)
Lipid/Hep 272.4/995.20  

## 2010-11-30 ENCOUNTER — Encounter (HOSPITAL_COMMUNITY): Payer: Medicare Other | Admitting: Psychiatry

## 2010-12-21 ENCOUNTER — Encounter (INDEPENDENT_AMBULATORY_CARE_PROVIDER_SITE_OTHER): Payer: Medicare Other | Admitting: Psychiatry

## 2010-12-21 DIAGNOSIS — F331 Major depressive disorder, recurrent, moderate: Secondary | ICD-10-CM

## 2011-01-19 ENCOUNTER — Emergency Department (HOSPITAL_COMMUNITY)
Admission: EM | Admit: 2011-01-19 | Discharge: 2011-01-20 | Disposition: A | Discharge status: Home / Self Care | Payer: No Typology Code available for payment source | Attending: Emergency Medicine | Admitting: Emergency Medicine

## 2011-01-19 ENCOUNTER — Emergency Department (HOSPITAL_COMMUNITY): Payer: No Typology Code available for payment source

## 2011-01-19 DIAGNOSIS — I10 Essential (primary) hypertension: Secondary | ICD-10-CM | POA: Insufficient documentation

## 2011-01-19 DIAGNOSIS — M549 Dorsalgia, unspecified: Secondary | ICD-10-CM | POA: Insufficient documentation

## 2011-01-19 DIAGNOSIS — IMO0002 Reserved for concepts with insufficient information to code with codable children: Secondary | ICD-10-CM | POA: Insufficient documentation

## 2011-01-20 ENCOUNTER — Encounter: Payer: Self-pay | Admitting: Internal Medicine

## 2011-01-20 LAB — GLUCOSE, CAPILLARY: Glucose-Capillary: 89 mg/dL (ref 70–99)

## 2011-01-23 ENCOUNTER — Ambulatory Visit (INDEPENDENT_AMBULATORY_CARE_PROVIDER_SITE_OTHER): Payer: Medicare Other | Admitting: Internal Medicine

## 2011-01-23 ENCOUNTER — Encounter: Payer: Self-pay | Admitting: Internal Medicine

## 2011-01-23 DIAGNOSIS — M542 Cervicalgia: Secondary | ICD-10-CM

## 2011-01-23 DIAGNOSIS — T887XXA Unspecified adverse effect of drug or medicament, initial encounter: Secondary | ICD-10-CM

## 2011-01-23 LAB — BASIC METABOLIC PANEL
BUN: 17 mg/dL (ref 6–23)
CO2: 27 mEq/L (ref 19–32)
Calcium: 8.6 mg/dL (ref 8.4–10.5)
Chloride: 108 mEq/L (ref 96–112)
Creatinine, Ser: 1 mg/dL (ref 0.4–1.5)
GFR: 83.53 mL/min (ref >60.00)
Glucose, Bld: 105 mg/dL — ABNORMAL HIGH (ref 70–99)
Potassium: 3.6 mEq/L (ref 3.5–5.1)
Sodium: 144 mEq/L (ref 135–145)

## 2011-01-23 LAB — CBC WITH DIFFERENTIAL/PLATELET
Basophils Absolute: 0.1 10*3/uL (ref 0.0–0.1)
Lymphocytes Relative: 15.4 % (ref 12.0–46.0)
Monocytes Relative: 6 % (ref 3.0–12.0)
Platelets: 229 10*3/uL (ref 150.0–400.0)
RDW: 13.6 % (ref 11.5–14.6)

## 2011-01-23 LAB — HEPATIC FUNCTION PANEL
AST: 21 U/L (ref 0–37)
Albumin: 3.7 g/dL (ref 3.5–5.2)
Alkaline Phosphatase: 63 U/L (ref 39–117)
Total Protein: 6.7 g/dL (ref 6.0–8.3)

## 2011-01-23 MED ORDER — CYCLOBENZAPRINE HCL 5 MG PO TABS: 5.0000 mg | ORAL_TABLET | ORAL | Status: AC
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
## 2011-01-23 NOTE — Patient Instructions (Signed)
Physical therapy or chiropractor he gets the pain has not resolved by the end of the week.

## 2011-01-25 LAB — HEMOGLOBIN A1C: Hgb A1c MFr Bld: 6.1 % (ref 4.6–6.5)

## 2011-01-27 ENCOUNTER — Other Ambulatory Visit: Payer: Self-pay | Admitting: Internal Medicine

## 2011-02-10 ENCOUNTER — Other Ambulatory Visit (HOSPITAL_COMMUNITY): Payer: Self-pay | Admitting: Psychiatry

## 2011-02-10 ENCOUNTER — Other Ambulatory Visit: Payer: Self-pay | Admitting: Internal Medicine

## 2011-02-10 NOTE — Telephone Encounter (Signed)
LIPID/HEP 272.4/995.20  

## 2011-02-17 ENCOUNTER — Other Ambulatory Visit (HOSPITAL_COMMUNITY): Payer: Self-pay | Admitting: Psychiatry

## 2011-02-17 DIAGNOSIS — F331 Major depressive disorder, recurrent, moderate: Secondary | ICD-10-CM

## 2011-03-04 ENCOUNTER — Other Ambulatory Visit (HOSPITAL_COMMUNITY): Payer: Self-pay | Admitting: Psychiatry

## 2011-03-04 DIAGNOSIS — F331 Major depressive disorder, recurrent, moderate: Secondary | ICD-10-CM

## 2011-03-11 ENCOUNTER — Other Ambulatory Visit: Payer: Self-pay | Admitting: Internal Medicine

## 2011-03-13 NOTE — Telephone Encounter (Signed)
LIPID/HEP 272.4/995.20  

## 2011-03-15 ENCOUNTER — Encounter (HOSPITAL_COMMUNITY): Payer: Self-pay | Admitting: Psychiatry

## 2011-03-15 ENCOUNTER — Ambulatory Visit (INDEPENDENT_AMBULATORY_CARE_PROVIDER_SITE_OTHER): Payer: Medicare Other | Admitting: Psychiatry

## 2011-03-15 VITALS — BP 130/85 | HR 63 | Ht 72.0 in | Wt 333.0 lb

## 2011-03-15 DIAGNOSIS — F331 Major depressive disorder, recurrent, moderate: Secondary | ICD-10-CM

## 2011-03-15 NOTE — Progress Notes (Signed)
[name] A [id number]  Subjective Patient came for his followup appointment. He's been doing better on his current medication. His mother recently fell and now she is admitted and rehabilitation Center. Patient continued to keep his job at [employer]. He likes his job. He denies any recent anxiety agitation anger or mood swings. He reported no side affects of his medication  Mental Status Examination Patient is morbid obese man who is casually dressed and fairly groomed. He endorses mood is anxious and his affect is constricted however he denies any active or passive suicidal thoughts or homicidal thoughts. There no psychotic symptoms present. He denies any auditory or visual hallucination. His speech is slow but clear and coherent. His attention and concentration is fair. His alert and oriented x3. His insight judgment and impulse control is okay.  Current Medication Current outpatient prescriptions:LORazepam (ATIVAN) 0.5 MG tablet, Take 1 tablet (0.5 mg total) by mouth 3 (three) times daily., Disp: 90 tablet, Rfl: 1;  risperiDONE (RISPERDAL) 0.5 MG tablet, TAKE ONE TABLET BY MOUTH AT BEDTIME, Disp: 90 tablet, Rfl: 0;  sertraline (ZOLOFT) 100 MG tablet, TAKE ONE AND ONE-HALF TABLETS BY MOUTH EVERY DAY, Disp: 135 tablet, Rfl: 0;  allopurinol (ZYLOPRIM) 300 MG tablet, Take 300 mg by mouth daily.  , Disp: , Rfl:  amLODipine-benazepril (LOTREL) 5-10 MG per capsule, TAKE ONE CAPSULE BY MOUTH EVERY DAY, Disp: 90 capsule, Rfl: 2;  aspirin 325 MG tablet, Take 325 mg by mouth 2 (two) times daily.  , Disp: , Rfl: ;  cyclobenzaprine (FLEXERIL) 5 MG tablet, Take 1 tablet (5 mg total) by mouth as directed. 1-2 qhs prn, Disp: 14 tablet, Rfl: 0;  erythromycin base (E-MYCIN) 500 MG tablet, Take 500 mg by mouth. Prior to dental work , Disp: , Rfl:  finasteride (PROSCAR) 5 MG tablet, Take 5 mg by mouth daily.  , Disp: , Rfl: ;  Multiple Vitamin (MULTIVITAMIN) tablet, Take 1 tablet by mouth daily.  , Disp: ,  Rfl: ;  naproxen (NAPROSYN) 500 MG tablet, Take 500 mg by mouth 2 (two) times daily with a meal.  , Disp: , Rfl: ;  pravastatin (PRAVACHOL) 40 MG tablet, TAKE ONE TABLET BY MOUTH EVERY DAY, Disp: 30 tablet, Rfl: 0   Assessment Maj. depressive disorder  Plan I will continue his current medication Risperdal 0.5 mg at bedtime, Zoloft 150 mg daily and Ativan 0.5 mg 3 times a day. I have explained risks and benefits of medication. He is not taking Ambien anymore. I recommended to call us if he has any question or concern about the medication or he feel worsening of his depression. I will see him again in 3 months

## 2011-04-12 ENCOUNTER — Other Ambulatory Visit: Payer: Self-pay | Admitting: Internal Medicine

## 2011-04-12 MED ORDER — PRAVASTATIN SODIUM 40 MG PO TABS: ORAL_TABLET | ORAL | Status: DC

## 2011-04-12 NOTE — Telephone Encounter (Signed)
Patient needs to schedule fasting labs Lipid/Hep 272.4/995.20 

## 2011-05-03 ENCOUNTER — Other Ambulatory Visit (HOSPITAL_COMMUNITY): Payer: Self-pay | Admitting: Psychiatry

## 2011-05-03 DIAGNOSIS — F339 Major depressive disorder, recurrent, unspecified: Secondary | ICD-10-CM

## 2011-05-06 ENCOUNTER — Other Ambulatory Visit (HOSPITAL_COMMUNITY): Payer: Self-pay | Admitting: Psychiatry

## 2011-05-06 DIAGNOSIS — F331 Major depressive disorder, recurrent, moderate: Secondary | ICD-10-CM

## 2011-05-10 ENCOUNTER — Other Ambulatory Visit: Payer: Self-pay | Admitting: Internal Medicine

## 2011-05-10 DIAGNOSIS — D4959 Neoplasm of unspecified behavior of other genitourinary organ: Secondary | ICD-10-CM | POA: Diagnosis not present

## 2011-05-10 NOTE — Telephone Encounter (Signed)
Lipid/Hep 272.4/995.20  

## 2011-05-18 DIAGNOSIS — N529 Male erectile dysfunction, unspecified: Secondary | ICD-10-CM | POA: Diagnosis not present

## 2011-05-18 DIAGNOSIS — N400 Enlarged prostate without lower urinary tract symptoms: Secondary | ICD-10-CM | POA: Diagnosis not present

## 2011-05-24 ENCOUNTER — Other Ambulatory Visit (HOSPITAL_COMMUNITY): Payer: Self-pay | Admitting: Psychiatry

## 2011-05-31 ENCOUNTER — Other Ambulatory Visit (HOSPITAL_COMMUNITY): Payer: Self-pay | Admitting: Psychiatry

## 2011-05-31 DIAGNOSIS — F331 Major depressive disorder, recurrent, moderate: Secondary | ICD-10-CM

## 2011-06-01 ENCOUNTER — Other Ambulatory Visit (HOSPITAL_COMMUNITY): Payer: Self-pay | Admitting: Psychiatry

## 2011-06-07 DIAGNOSIS — J209 Acute bronchitis, unspecified: Secondary | ICD-10-CM | POA: Diagnosis not present

## 2011-06-07 DIAGNOSIS — F329 Major depressive disorder, single episode, unspecified: Secondary | ICD-10-CM | POA: Diagnosis not present

## 2011-06-07 DIAGNOSIS — J329 Chronic sinusitis, unspecified: Secondary | ICD-10-CM | POA: Diagnosis not present

## 2011-06-07 DIAGNOSIS — R5383 Other fatigue: Secondary | ICD-10-CM | POA: Diagnosis not present

## 2011-06-07 DIAGNOSIS — R5381 Other malaise: Secondary | ICD-10-CM | POA: Diagnosis not present

## 2011-06-07 DIAGNOSIS — I10 Essential (primary) hypertension: Secondary | ICD-10-CM | POA: Diagnosis not present

## 2011-06-13 ENCOUNTER — Other Ambulatory Visit: Payer: Self-pay | Admitting: Internal Medicine

## 2011-06-13 NOTE — Telephone Encounter (Signed)
Prescription sent to pharmacy.

## 2011-06-14 ENCOUNTER — Ambulatory Visit (HOSPITAL_COMMUNITY): Payer: Medicare Other | Admitting: Psychiatry

## 2011-06-21 ENCOUNTER — Encounter (HOSPITAL_COMMUNITY): Payer: Self-pay | Admitting: Psychiatry

## 2011-06-21 ENCOUNTER — Ambulatory Visit (INDEPENDENT_AMBULATORY_CARE_PROVIDER_SITE_OTHER): Payer: Medicare Other | Admitting: Psychiatry

## 2011-06-21 VITALS — BP 118/72 | HR 64 | Wt 335.2 lb

## 2011-06-21 DIAGNOSIS — F331 Major depressive disorder, recurrent, moderate: Secondary | ICD-10-CM

## 2011-06-21 MED ORDER — LORAZEPAM 0.5 MG PO TABS: ORAL_TABLET | ORAL | Status: DC

## 2011-06-21 NOTE — Progress Notes (Signed)
Chief complaint I am doing better on her medication  History of present illness Patient is 66 year old Caucasian married unemployed male who came for his followup appointment. He has been doing better on his medication. Last week he had missed 4 days of Zoloft and started to feel irritable angry and agitated. He realized that he did not put sertraline and medication box and has been noncompliant for past 4 days. He then soon to start taking Zoloft and started to feel much better in his mood. He is sleeping fine and reported no other concern her side effects of the medication. He denies any agitation anger and mood swings. He denies any crying spells or nervousness. He is going to his part-time job at [employer] and enjoy his job. He denies any tremors or shakes. He is some concern about his weight and prediabetic condition. He is scheduled to see his primary care physician Dr. Alwyn Ren next week to discuss about weight loss and even gastric surgery to lose weight. He is not drinking or using any illegal substances. He does not ask for early refill office Ativan.  Current psychiatric medication Zoloft 150 mg daily Risperdal 0.5 mg at bedtime Ativan 0.5 mg 1 in the morning and 2 at bedtime  Past psychiatric history Patient has one psychiatric admission in 2006 due to increase depression and having suicidal thinking. Since then patient has been seeing in this office and fairly stable on his medication.  Medical history Patient has benign prostate hypertrophy, hypertension, gout, sleep apnea, prediabetic condition and colonic polyps.  Alcohol and substance use history Patient denies any history of alcohol and substance use.  Family history Patient endorsed mother and brother has history of depression.  Psychosocial history Patient lives with his wife who has been supportive. He is currently working as a part-time work at [employer]. He is concerned about his mother who recently fell and is still in  a nursing home.  Mental status examination Patient is mildly obese man who is casually dressed and fairly groomed. He appears anxious but relevant in conversation. His speech is clear and coherent. His thought process is logical linear and goal-directed. He maintained good eye contact. He denies any active or passive suicidal thinking and homicidal thinking. He denies any auditory or visual hallucination. There no psychotic symptoms present at this time. There no tremors or shakes present. His attention and concentration is fair. He described his mood is good and his affect was mood congruent. He's alert and oriented x3. His insight judgment and impulse control is okay.  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 At this time patient is fairly stable on his medication. He is very reluctant to reduce his medication at this time. I talked about medication side effects including Risperdal causing metabolic side effects however patient does not want to change his Risperdal at this time. He will talk to his primary care physician in mole detail about weight loss programs. I will continue his medication. A new prescription of lorazepam is given. He still has refill on his other medication. I will see him again in 2 months.

## 2011-06-28 ENCOUNTER — Other Ambulatory Visit (HOSPITAL_COMMUNITY): Payer: Self-pay | Admitting: Psychiatry

## 2011-06-29 ENCOUNTER — Other Ambulatory Visit (HOSPITAL_COMMUNITY): Payer: Self-pay | Admitting: Psychiatry

## 2011-06-29 ENCOUNTER — Telehealth (HOSPITAL_COMMUNITY): Payer: Self-pay | Admitting: *Deleted

## 2011-06-29 DIAGNOSIS — F331 Major depressive disorder, recurrent, moderate: Secondary | ICD-10-CM

## 2011-08-21 ENCOUNTER — Ambulatory Visit (INDEPENDENT_AMBULATORY_CARE_PROVIDER_SITE_OTHER): Payer: Medicare Other | Admitting: Psychiatry

## 2011-08-21 ENCOUNTER — Encounter (HOSPITAL_COMMUNITY): Payer: Self-pay | Admitting: Psychiatry

## 2011-08-21 DIAGNOSIS — F323 Major depressive disorder, single episode, severe with psychotic features: Secondary | ICD-10-CM

## 2011-08-21 DIAGNOSIS — F331 Major depressive disorder, recurrent, moderate: Secondary | ICD-10-CM

## 2011-08-21 MED ORDER — LORAZEPAM 0.5 MG PO TABS: ORAL_TABLET | ORAL | Status: DC

## 2011-08-21 MED ORDER — SERTRALINE HCL 100 MG PO TABS: ORAL_TABLET | ORAL | Status: DC

## 2011-08-21 MED ORDER — RISPERIDONE 0.5 MG PO TABS: 0.5000 mg | ORAL_TABLET | Freq: Every day | ORAL | Status: DC

## 2011-08-21 NOTE — Progress Notes (Signed)
Chief complaint Medication management and followup.    History of present illness Patient is 66 year old Caucasian married unemployed male who came for his followup appointment.  He quit his working at [employer] because of knee pain.  He endorse is standing all day was causing too much pain in his knees.  He is looking for a job that does not require standing .  Overall he's been stable on his medication.  He denies any agitation anger mood swing.  He denies any crying spells.  He sleeps fine.  He likes to continue his current psychiatric medication.  He is not drinking or using any illegal substance.  He has not seen his primary care physician to talk about weight loss program however he is interested and he will discuss when his annual checkup is due.   Current psychiatric medication Zoloft 150 mg daily Risperdal 0.5 mg at bedtime Ativan 0.5 mg 1 in the morning and 2 at bedtime  Past psychiatric history Patient has one psychiatric admission in 2006 due to increase depression and having suicidal thinking. Since then patient has been seeing in this office and fairly stable on his medication.  Medical history Patient has benign prostate hypertrophy, hypertension, gout, sleep apnea, prediabetic condition and colonic polyps.  His primary care physician is Dr. Alwyn Ren.  Alcohol and substance use history Patient denies any history of alcohol and substance use.  Family history Patient endorsed mother and brother has history of depression.  Psychosocial history Patient lives with his wife who has been supportive. He is currently working as a part-time work at [employer]. He is concerned about his mother who recently fell and is still in a nursing home.  Mental status examination Patient is mildly obese man who is casually dressed and fairly groomed. He appears anxious but relevant in conversation. His speech is clear and coherent. His thought process is logical linear and goal-directed. He  maintained good eye contact. He denies any active or passive suicidal thinking and homicidal thinking. He denies any auditory or visual hallucination. There no psychotic symptoms present at this time. There no tremors or shakes present. His attention and concentration is fair. He described his mood is good and his affect was mood congruent. He's alert and oriented x3. His insight judgment and impulse control is okay.  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  He denies any tremors or shakes.  I encourage him to schedule appointment with his primary care physician to discuss about his weight loss and even gastric surgery .  I explained risks and benefits of medication.  I recommend to call us if he feel worsening of her symptoms or if he is in issues with the medication.  I will see him again in 3 months.

## 2011-08-22 ENCOUNTER — Other Ambulatory Visit (HOSPITAL_COMMUNITY): Payer: Self-pay | Admitting: Psychiatry

## 2011-08-23 NOTE — Telephone Encounter (Signed)
Per pharmacy, refilled on 5/20.Patient has not picked up.

## 2011-10-24 ENCOUNTER — Other Ambulatory Visit: Payer: Self-pay | Admitting: Internal Medicine

## 2011-10-31 ENCOUNTER — Other Ambulatory Visit (HOSPITAL_COMMUNITY): Payer: Self-pay | Admitting: Psychiatry

## 2011-10-31 DIAGNOSIS — F331 Major depressive disorder, recurrent, moderate: Secondary | ICD-10-CM

## 2011-11-01 ENCOUNTER — Other Ambulatory Visit (HOSPITAL_COMMUNITY): Payer: Self-pay | Admitting: *Deleted

## 2011-11-01 DIAGNOSIS — F331 Major depressive disorder, recurrent, moderate: Secondary | ICD-10-CM

## 2011-11-01 MED ORDER — LORAZEPAM 0.5 MG PO TABS: ORAL_TABLET | ORAL | Status: DC

## 2011-11-01 NOTE — Telephone Encounter (Signed)
Correction of earlier error: Medication was phoned in earlier today, but entered in the chart erroneously as printed.

## 2011-11-27 ENCOUNTER — Ambulatory Visit (INDEPENDENT_AMBULATORY_CARE_PROVIDER_SITE_OTHER): Payer: Medicare Other | Admitting: Psychiatry

## 2011-11-27 ENCOUNTER — Encounter (HOSPITAL_COMMUNITY): Payer: Self-pay | Admitting: Psychiatry

## 2011-11-27 DIAGNOSIS — F331 Major depressive disorder, recurrent, moderate: Secondary | ICD-10-CM

## 2011-11-27 DIAGNOSIS — F329 Major depressive disorder, single episode, unspecified: Secondary | ICD-10-CM

## 2011-11-27 MED ORDER — RISPERIDONE 0.5 MG PO TABS: 0.5000 mg | ORAL_TABLET | Freq: Every day | ORAL | Status: DC

## 2011-11-27 MED ORDER — SERTRALINE HCL 100 MG PO TABS: ORAL_TABLET | ORAL | Status: DC

## 2011-11-27 NOTE — Progress Notes (Signed)
Chief complaint Medication management and followup.    History of present illness Patient is 66 year old Caucasian married unemployed male who came for his followup appointment.  He is compliant with the medication and reported no side effects.  He has not seen his primary care physician but he is appointment in October.  He liked discuss weight loss surgery with his primary care physician.  Overall he is doing better since he quit working at [employer].  He has less knee pain.  He is sleeping better.  Occasionally he get bad dreams but overall no new issues.  He stopped taking Ambien and has not taken in past 2 months.  He has no issues with the medication.  He's not drinking or using any illegal substance.  He does not abuse his lorazepam.  Current psychiatric medication Zoloft 150 mg daily Risperdal 0.5 mg at bedtime Ativan 0.5 mg 1 in the morning and 2 at bedtime  Past psychiatric history Patient has one psychiatric admission in 2006 due to increase depression and having suicidal thinking. Since then patient has been seeing in this office and fairly stable on his medication.  Medical history Patient has benign prostate hypertrophy, hypertension, gout, sleep apnea, prediabetic condition and colonic polyps.  His primary care physician is Dr. Alwyn Ren.  Alcohol and substance use history Patient denies any history of alcohol and substance use.  Family history Patient endorsed mother and brother has history of depression.  Psychosocial history Patient lives with his wife who has been supportive. He is currently working as a part-time work at [employer]. He is concerned about his mother who recently fell and is still in a nursing home.  Mental status examination Patient is mildly obese man who is casually dressed and fairly groomed. He appears pleasant and cooperative.  His speech is clear and coherent. His thought process is logical linear and goal-directed. He maintained good eye contact. He  denies any active or passive suicidal thinking and homicidal thinking. He denies any auditory or visual hallucination. There no psychotic symptoms present at this time. There no tremors or shakes present. His attention and concentration is fair. He described his mood is good and his affect was mood congruent. He's alert and oriented x3. His insight judgment and impulse control is okay.  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  He denies any tremors or shakes.  Patient will discuss weight loss surgery with his primary care physician when he see him in October.  He was also get annual physical checkup and labs.  I discuss risks and benefits of medication and recommended to call us if he has any question or concern.  I will see him again in 3 months.

## 2011-12-05 ENCOUNTER — Other Ambulatory Visit (HOSPITAL_COMMUNITY): Payer: Self-pay | Admitting: Psychiatry

## 2011-12-06 ENCOUNTER — Telehealth (HOSPITAL_COMMUNITY): Payer: Self-pay

## 2011-12-06 ENCOUNTER — Other Ambulatory Visit (HOSPITAL_COMMUNITY): Payer: Self-pay | Admitting: Psychiatry

## 2012-01-03 ENCOUNTER — Other Ambulatory Visit: Payer: Self-pay | Admitting: Internal Medicine

## 2012-01-03 ENCOUNTER — Other Ambulatory Visit (HOSPITAL_COMMUNITY): Payer: Self-pay | Admitting: Psychiatry

## 2012-01-03 DIAGNOSIS — F331 Major depressive disorder, recurrent, moderate: Secondary | ICD-10-CM

## 2012-01-03 NOTE — Telephone Encounter (Signed)
Patient needs to schedule a CPX with fasting labs  

## 2012-02-06 ENCOUNTER — Other Ambulatory Visit: Payer: Self-pay | Admitting: Internal Medicine

## 2012-02-06 ENCOUNTER — Other Ambulatory Visit (HOSPITAL_COMMUNITY): Payer: Self-pay | Admitting: Psychiatry

## 2012-02-06 DIAGNOSIS — F331 Major depressive disorder, recurrent, moderate: Secondary | ICD-10-CM

## 2012-02-06 MED ORDER — LORAZEPAM 0.5 MG PO TABS: ORAL_TABLET | ORAL | Status: DC

## 2012-02-06 NOTE — Telephone Encounter (Signed)
Lipid/Hep 272.4/995.20  

## 2012-02-13 ENCOUNTER — Other Ambulatory Visit: Payer: Medicare Other

## 2012-02-15 ENCOUNTER — Other Ambulatory Visit (INDEPENDENT_AMBULATORY_CARE_PROVIDER_SITE_OTHER): Payer: Medicare Other

## 2012-02-15 DIAGNOSIS — E785 Hyperlipidemia, unspecified: Secondary | ICD-10-CM

## 2012-02-15 DIAGNOSIS — T887XXA Unspecified adverse effect of drug or medicament, initial encounter: Secondary | ICD-10-CM | POA: Diagnosis not present

## 2012-02-15 LAB — HEPATIC FUNCTION PANEL
ALT: 20 U/L (ref 0–53)
AST: 20 U/L (ref 0–37)
Alkaline Phosphatase: 64 U/L (ref 39–117)
Bilirubin, Direct: 0 mg/dL (ref 0.0–0.3)
Total Bilirubin: 0.7 mg/dL (ref 0.3–1.2)

## 2012-02-15 LAB — LIPID PANEL: Triglycerides: 217 mg/dL — ABNORMAL HIGH (ref 0.0–149.0)

## 2012-02-20 ENCOUNTER — Other Ambulatory Visit (HOSPITAL_COMMUNITY): Payer: Self-pay | Admitting: Psychiatry

## 2012-02-20 ENCOUNTER — Ambulatory Visit (INDEPENDENT_AMBULATORY_CARE_PROVIDER_SITE_OTHER): Payer: Medicare Other | Admitting: Internal Medicine

## 2012-02-20 ENCOUNTER — Encounter: Payer: Self-pay | Admitting: Internal Medicine

## 2012-02-20 ENCOUNTER — Telehealth: Payer: Self-pay

## 2012-02-20 VITALS — BP 126/80 | HR 73 | Ht 73.03 in | Wt 356.6 lb

## 2012-02-20 DIAGNOSIS — I10 Essential (primary) hypertension: Secondary | ICD-10-CM

## 2012-02-20 DIAGNOSIS — M109 Gout, unspecified: Secondary | ICD-10-CM

## 2012-02-20 DIAGNOSIS — E8881 Metabolic syndrome: Secondary | ICD-10-CM

## 2012-02-20 DIAGNOSIS — R7309 Other abnormal glucose: Secondary | ICD-10-CM | POA: Diagnosis not present

## 2012-02-20 DIAGNOSIS — E785 Hyperlipidemia, unspecified: Secondary | ICD-10-CM | POA: Diagnosis not present

## 2012-02-20 DIAGNOSIS — F331 Major depressive disorder, recurrent, moderate: Secondary | ICD-10-CM

## 2012-02-20 DIAGNOSIS — R209 Unspecified disturbances of skin sensation: Secondary | ICD-10-CM

## 2012-02-20 LAB — HEMOGLOBIN A1C: Hgb A1c MFr Bld: 5.8 % (ref 4.6–6.5)

## 2012-02-20 LAB — TSH: TSH: 1.61 u[IU]/mL (ref 0.35–5.50)

## 2012-02-20 LAB — VITAMIN B12: Vitamin B-12: 321 pg/mL (ref 211–911)

## 2012-02-20 NOTE — Assessment & Plan Note (Signed)
Blood pressure is well controlled; renal function should be checked

## 2012-02-20 NOTE — Assessment & Plan Note (Signed)
TSH and A1c indicated to rule out hypothyroidism or diabetes

## 2012-02-20 NOTE — Assessment & Plan Note (Signed)
A1c, TSH, and B12 levels will be checked

## 2012-02-20 NOTE — Progress Notes (Signed)
  Subjective:    Patient ID: [name], male    DOB: 09/08/1945, 66 y.o.   MRN: [id number]  HPI HYPERTENSION: Disease Monitoring: Blood pressure range-120s-130s/70-90s  Chest pain, palpitations- no       Dyspnea- due to deconditioning & weight excess; Dr Lolly Mustache has supported pursuing Bariatric Surgery Medications: Compliance- yes  Lightheadedness,Syncope- no    Edema-chronic  METABOLIC SYNDROME: Disease Monitoring: Blood Sugar ranges-no monitor  Polyuria/phagia/dipsia-no      Visual problems- no Medications: Compliance- no but restricting sugar indiet   HYPERLIPIDEMIA: Disease Monitoring: See symptoms for Hypertension Medications: Compliance-yes Abd pain, bowel changes-no   Muscle aches- no  Labs were reviewed; triglycerides are 217, VLDL 43.4, HDL 31.2 and LDL 111.6. Liver functions are normal.        Review of Systems  He describes numbness and tingling in his feet, present for at least 5-7 years. This has impacted gait; he is unable to wear flip-flops because of this.     Objective:   Physical Exam Gen.:  well-nourished in appearance; but central weight excess. Alert, appropriate and cooperative throughout exam.  Eyes: No corneal or conjunctival inflammation noted. No icterus Mouth: Oral mucosa and oropharynx reveal no lesions or exudates. Teeth in good repair. Neck: No deformities, masses, or tenderness noted.  Thyroid normal. Lungs: Normal respiratory effort; chest expands symmetrically. Lungs are clear to auscultation without rales, wheezes, or increased work of breathing. Heart: Normal rate and rhythm. Normal S1 and S2. No gallop, click, or rub. S4 w/o murmur. Abdomen: Bowel sounds normal; abdomen protuberant but nontender. No masses, organomegaly or hernias noted.                                                                    Musculoskeletal/extremities:  No clubbing, cyanosis,  or deformity noted. Tone & strength  normal.Joints normal. Nail health   Good. R index DIP post traumatic changes. Trace edema @ sock line Vascular: Carotid, radial artery, dorsalis pedis and  posterior tibial pulses are full and equal. No bruits present. Neurologic: Alert and oriented x3. Deep tendon reflexes symmetrical and normal.  Light touch decreased over feet.          Skin: Intact without suspicious lesions or rashes. Lymph: No cervical, axillary lymphadenopathy present. Psych: Mood and affect are normal. Normally interactive                                                                                        Assessment & Plan:

## 2012-02-20 NOTE — Assessment & Plan Note (Signed)
His LDL was minimally elevated; a major issue is nutritional, most likely related to excess high fructose corn syrup  elevating the triglycerides and causing central obesity. The pathophysiology of insulin resistance was discussed.

## 2012-02-20 NOTE — Assessment & Plan Note (Signed)
Uric acid level indicated in view of the hypertriglyceridemia; high fructose corn syrup ingestion is # 1 cause of elevated uric acid

## 2012-02-20 NOTE — Telephone Encounter (Signed)
Pt called [facility] triage line stating needed help accessing mychart. Called pt to advise to look at last page of today's OV to get info. Pt stated understanding and stated he CB and spoke to someone that advised samething (didn't see any notes).   MW

## 2012-02-20 NOTE — Patient Instructions (Addendum)
Review and correct the record as indicated. Please share record with all medical staff seen.  The most common cause of elevated triglycerides AND uric acid is the ingestion of sugar from high fructose corn syrup sources added to processed foods & drinks.  Eat a low-fat diet with lots of fruits and vegetables, up to 7-9 servings per day. Consume less than40 (preferably ZERO) grams of sugar per day from foods & drinks with High Fructose Corn Syrup (HFCS) sugar as #1,2,3 or # 4 on label.Whole Foods, [employer] & [employer] do not carry products with HFCS. Follow a  low carb nutrition program such as [place] or [employer]  to prevent Diabetes  . White carbohydrates (potatoes, rice, bread, and pasta) have a high spike of sugar and a high load of sugar. For example a  baked potato has a cup of sugar and a  french fry  2 teaspoons of sugar. Yams, wild  rice, whole grained bread &  wheat pasta have been much lower spike and load of  sugar. Portions should be the size of a deck of cards or your palm.   If you activate My Chart; the results can be released to you as soon as they populate from the lab. If you choose not to use this program; the labs have to be reviewed, copied & mailed   causing a delay in getting the results to you.

## 2012-02-23 ENCOUNTER — Encounter: Payer: Self-pay | Admitting: Internal Medicine

## 2012-03-04 ENCOUNTER — Ambulatory Visit (HOSPITAL_COMMUNITY): Payer: Medicare Other | Admitting: Psychiatry

## 2012-03-05 ENCOUNTER — Encounter (HOSPITAL_COMMUNITY): Payer: Self-pay | Admitting: Psychiatry

## 2012-03-05 ENCOUNTER — Ambulatory Visit (INDEPENDENT_AMBULATORY_CARE_PROVIDER_SITE_OTHER): Payer: Medicare Other | Admitting: Psychiatry

## 2012-03-05 ENCOUNTER — Other Ambulatory Visit: Payer: Self-pay | Admitting: Internal Medicine

## 2012-03-05 ENCOUNTER — Other Ambulatory Visit (HOSPITAL_COMMUNITY): Payer: Self-pay | Admitting: Psychiatry

## 2012-03-05 VITALS — BP 118/68 | HR 68 | Wt 359.4 lb

## 2012-03-05 DIAGNOSIS — F329 Major depressive disorder, single episode, unspecified: Secondary | ICD-10-CM | POA: Diagnosis not present

## 2012-03-05 DIAGNOSIS — F29 Unspecified psychosis not due to a substance or known physiological condition: Secondary | ICD-10-CM

## 2012-03-05 DIAGNOSIS — F331 Major depressive disorder, recurrent, moderate: Secondary | ICD-10-CM

## 2012-03-05 MED ORDER — LORAZEPAM 0.5 MG PO TABS: ORAL_TABLET | ORAL | Status: DC

## 2012-03-05 MED ORDER — RISPERIDONE 0.5 MG PO TABS: 0.5000 mg | ORAL_TABLET | Freq: Every day | ORAL | Status: DC

## 2012-03-05 NOTE — Progress Notes (Signed)
Patient ID: [name], male   DOB: [birth year], 66 y.o.   MRN: [id number] Chief complaint Medication management and followup.    History of present illness Patient is 66 year old Caucasian married unemployed male who came for his followup appointment.  He is concerned about his weight gain.  He recently seen his primary care physician Dr. Alwyn Ren who did his blood work.  His triglycerides are elevated however his basic chemistry was normal.  He talked to his primary care physician about gastric bypass surgery however he was told to get clearance from psychiatry.  I had a long discussion with the patient on this issue.  Patient like to get weight loss and he is trying with diet however he is unable to lose weight.  He cannot do exercise do to joint pain.  He quit working at [employer] due to knee pain.  His depression is stable and he is compliant with the medication.  He denies any side effects.  He is sleeping better.  He had a good Thanksgiving.  He denies any recent crying spells or social isolation.  His concentration and attention is better.  He denies any paranoia, mood swing or any irritability.  He's not drinking or using any illegal substance.  He uses Ativan as prescribed.  He never ask for early refills.  Current psychiatric medication Zoloft 150 mg daily Risperdal 0.5 mg at bedtime Ativan 0.5 mg 1 in the morning and 2 at bedtime  Past psychiatric history Patient has one psychiatric admission in 2006 due to increase depression and having suicidal thinking. Since then patient has been seeing in this office and fairly stable on his medication.  Alcohol and substance use history Patient denies any history of alcohol and substance use.  Family history Patient endorsed mother and brother has history of depression.  Psychosocial history Patient lives with his wife who has been supportive. He was working as a part-time at [employer] however he quit working do to knee pain.    Review  of Systems  Musculoskeletal: Positive for back pain and joint pain.  Neurological: Negative for dizziness, seizures and loss of consciousness.  Psychiatric/Behavioral: Negative for depression, suicidal ideas and hallucinations. The patient has insomnia.    Patient Active Problem List  Diagnosis  . HYPERLIPIDEMIA  . GOUT  . DYSMETABOLIC SYNDROME X  . DEPRESSION  . HTN (hypertension)  . BENIGN PROSTATIC HYPERTROPHY  . SLEEP APNEA  . PARESTHESIA  . PROSTATE SPECIFIC ANTIGEN, ELEVATED  . COLONIC POLYPS, HX OF  . NEPHROLITHIASIS, HX OF   his primary care physician is Dr. Alwyn Ren.   Mental status examination Patient is mildly obese man who is casually dressed and fairly groomed. He appears pleasant and cooperative.  His speech is clear and coherent. His thought process is logical linear and goal-directed. He maintained good eye contact. He denies any active or passive suicidal thinking and homicidal thinking. He denies any auditory or visual hallucination.  There were no flight of ideas or loose association.  His fund of knowledge is adequate.  There no psychotic symptoms present at this time. There no tremors or shakes present. His attention and concentration is fair. He described his mood is good and his affect was mood congruent. He's alert and oriented x3. His insight judgment and impulse control is okay.  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  I had a discussion with the patient about gastric bypass surgery .  Patient is psychiatrically stable to make his decision.  He likes his current psychiatric medication.  He realized that just diet controlled will be not enough to produce his weight.  Patient will contact his primary care physician to expedite this process.  I will continue his  current psychiatric medication.  There've been no change in his psychiatric medication.  I recommend to call us if his any question or concern about the medication if he feel worsening of the symptoms.  I will see him again in 3 months.  Time spent 30 minutes.  More than 50% of time spent a psychoeducation counseling and coordination of care.

## 2012-03-05 NOTE — Telephone Encounter (Signed)
Rx sent.    MW 

## 2012-03-08 ENCOUNTER — Other Ambulatory Visit: Payer: Self-pay | Admitting: Internal Medicine

## 2012-03-13 ENCOUNTER — Encounter: Payer: Self-pay | Admitting: Internal Medicine

## 2012-04-16 ENCOUNTER — Encounter: Payer: Self-pay | Admitting: Internal Medicine

## 2012-04-20 ENCOUNTER — Other Ambulatory Visit: Payer: Self-pay | Admitting: Internal Medicine

## 2012-05-13 DIAGNOSIS — Z125 Encounter for screening for malignant neoplasm of prostate: Secondary | ICD-10-CM | POA: Diagnosis not present

## 2012-05-13 LAB — PSA: PSA: 1.13

## 2012-05-28 DIAGNOSIS — N400 Enlarged prostate without lower urinary tract symptoms: Secondary | ICD-10-CM | POA: Diagnosis not present

## 2012-05-28 DIAGNOSIS — N200 Calculus of kidney: Secondary | ICD-10-CM | POA: Diagnosis not present

## 2012-05-29 ENCOUNTER — Other Ambulatory Visit (HOSPITAL_COMMUNITY): Payer: Self-pay | Admitting: Psychiatry

## 2012-05-29 DIAGNOSIS — F331 Major depressive disorder, recurrent, moderate: Secondary | ICD-10-CM

## 2012-06-03 ENCOUNTER — Ambulatory Visit (INDEPENDENT_AMBULATORY_CARE_PROVIDER_SITE_OTHER): Payer: Medicare Other | Admitting: Psychiatry

## 2012-06-03 ENCOUNTER — Encounter (HOSPITAL_COMMUNITY): Payer: Self-pay | Admitting: Psychiatry

## 2012-06-03 VITALS — BP 118/72 | HR 78 | Wt 369.6 lb

## 2012-06-03 DIAGNOSIS — F323 Major depressive disorder, single episode, severe with psychotic features: Secondary | ICD-10-CM | POA: Diagnosis not present

## 2012-06-03 DIAGNOSIS — F331 Major depressive disorder, recurrent, moderate: Secondary | ICD-10-CM

## 2012-06-03 MED ORDER — RISPERIDONE 0.5 MG PO TABS: 0.5000 mg | ORAL_TABLET | Freq: Every day | ORAL | Status: DC

## 2012-06-03 MED ORDER — SERTRALINE HCL 100 MG PO TABS: ORAL_TABLET | ORAL | Status: DC

## 2012-06-03 NOTE — Progress Notes (Signed)
Patient ID: [name], male   DOB: Aug 11, 1945, 67 y.o.   MRN: [id number] Chief complaint Medication management and followup.    History of present illness Patient is 67 year old who came for her followup appointment.  Patient is compliant with his medication.  He denies any side effects.  He continued to his stress about his weight gain.  He actually gain and more point from his last visit.  He does not exercise due to joint pain.  He was hoping that his primary care physician will refer him for bariatric surgery appointment.  He is also complaining of tingling and numbness in his feet.  He endorse some time not sleeping very well but overall his depression is under control.  He denies any crying spells or any agitation anger mood swing.  He social and his life.  He's not drinking or using any illegal substance.  Current psychiatric medication Zoloft 150 mg daily Risperdal 0.5 mg at bedtime Ativan 0.5 mg 1 in the morning and 2 at bedtime  Past psychiatric history Patient has one psychiatric admission in 2006 due to increase depression and having suicidal thinking. Since then patient has been seeing in this office and fairly stable on his medication.  Alcohol and substance use history Patient denies any history of alcohol and substance use.  Family history Patient endorsed mother and brother has history of depression.  Psychosocial history Patient lives with his wife who has been supportive. He was working as a part-time at [employer] however he quit working do to knee pain.    Review of Systems  Musculoskeletal: Positive for back pain and joint pain.  Neurological: Negative for dizziness, seizures and loss of consciousness.  Psychiatric/Behavioral: Negative for depression, suicidal ideas and hallucinations. The patient is nervous/anxious.    Patient Active Problem List  Diagnosis  . HYPERLIPIDEMIA  . GOUT  . DYSMETABOLIC SYNDROME X  . DEPRESSION  . HTN (hypertension)  .  BENIGN PROSTATIC HYPERTROPHY  . SLEEP APNEA  . PARESTHESIA  . PROSTATE SPECIFIC ANTIGEN, ELEVATED  . COLONIC POLYPS, HX OF  . NEPHROLITHIASIS, HX OF   his primary care physician is Dr. Alwyn [name].   Mental status examination Patient is mildly obese man who is casually dressed and fairly groomed. He appears pleasant and cooperative.  His speech is clear and coherent. His thought process is logical linear and goal-directed. He maintained good eye contact. He denies any active or passive suicidal thinking and homicidal thinking. He denies any auditory or visual hallucination.  There were no flight of ideas or loose association.  His fund of knowledge is adequate.  There no psychotic symptoms present at this time. There no tremors or shakes present. His attention and concentration is fair. He described his mood is okay and his affect is mood appropriate.  He's alert and oriented x3. His insight judgment and impulse control is okay.  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  I recommend to contact his primary care physician to get a referral for pediatrics surgery and neurology for tingling in his feet .  At this time I will continue his current psychiatric medication.  Patient does not have any side effects.  I recommend to call us back if his any question or concern if he feel worsening of the symptom.  I will see him again in 3 months.

## 2012-06-05 ENCOUNTER — Other Ambulatory Visit (HOSPITAL_COMMUNITY): Payer: Self-pay | Admitting: Psychiatry

## 2012-07-26 ENCOUNTER — Encounter: Payer: Self-pay | Admitting: General Practice

## 2012-07-26 ENCOUNTER — Encounter: Payer: Self-pay | Admitting: Internal Medicine

## 2012-08-09 ENCOUNTER — Encounter: Payer: Self-pay | Admitting: Internal Medicine

## 2012-08-09 ENCOUNTER — Ambulatory Visit (INDEPENDENT_AMBULATORY_CARE_PROVIDER_SITE_OTHER): Payer: Medicare Other | Admitting: Internal Medicine

## 2012-08-09 DIAGNOSIS — E785 Hyperlipidemia, unspecified: Secondary | ICD-10-CM | POA: Diagnosis not present

## 2012-08-09 DIAGNOSIS — I10 Essential (primary) hypertension: Secondary | ICD-10-CM

## 2012-08-09 DIAGNOSIS — M109 Gout, unspecified: Secondary | ICD-10-CM

## 2012-08-09 DIAGNOSIS — E8881 Metabolic syndrome: Secondary | ICD-10-CM

## 2012-08-09 NOTE — Progress Notes (Signed)
  Subjective:    Patient ID: [name], male    DOB: 10-10-1945, 67 y.o.   MRN: [id number]  HPI  For almost 3 decades he has been attempting to lose weight. He has a diagnosis of morbid obesity with comorbidities of hypertriglyceridemia, hypertension, metabolic syndrome, gallop, and sleep apnea.  Slim Fast, Weight Watchers', and hypnosis have resulted in significant weight loss but this is not been sustainable.  A summary of the history is found in the problem list under the diagnosis morbid obesity. His weight has also been a factor and his chronic depression. It  exacerbatied his degenerative joint disease which led to bilateral total knee replacement in 2007.    Review of Systems   His sleep apnea is essentially well controlled with CPAP; but the facial obesity requires he leave it off occasionally as it "cuts into his face".     Objective:   Physical Exam Gen.: Morbidly obese in appearance. Alert, appropriate and cooperative throughout exam.  Head: Normocephalic without obvious abnormalities  Eyes: No corneal or conjunctival inflammation noted. No icterus Mouth: Oral mucosa and oropharynx reveal no lesions or exudates. Teeth in good repair. Oropharynx is crowded Neck: No deformities, masses, or tenderness noted. Range of motion decreased.  Lungs: Normal respiratory effort; chest expands symmetrically. Lungs are clear to auscultation without rales, wheezes, or increased work of breathing. Breath sounds decreased overall. Dyspneic with minimal exertion Heart: Normal rate and rhythm. Normal S1 and S2. No gallop, click, or rub.  S4 without murmur. Abdomen: Bowel sounds normal; abdomen  protuberant . No masses, organomegaly or hernias noted.                                 Musculoskeletal/extremities: No clubbing or cyanosis.1+ pitting edema at the sock line .  Un able to lie down & sit up w/o help.   Neurologic: Alert and oriented x3.  Skin: Intact without suspicious lesions  or rashes. slightly diaphoretic  Lymph: No cervical, axillary, or inguinal lymphadenopathy present. Psych: Mood and affect are normal. Normally interactive                                                                                        Assessment & Plan:  #38morbid obesity with BMI of 49.63  #2 multiple comorbidities as listed  Plan: I feel this patient would benefit from weight loss surgery because he has been unsuccessful in losing weight with other dietary methods. His medical comorbidities will become health/life threatening without weight control.

## 2012-08-09 NOTE — Patient Instructions (Addendum)
Share results with all non [facility] medical staff seen  

## 2012-08-28 ENCOUNTER — Ambulatory Visit (INDEPENDENT_AMBULATORY_CARE_PROVIDER_SITE_OTHER): Payer: Medicare Other | Admitting: General Surgery

## 2012-08-28 ENCOUNTER — Encounter (INDEPENDENT_AMBULATORY_CARE_PROVIDER_SITE_OTHER): Payer: Self-pay | Admitting: General Surgery

## 2012-08-28 VITALS — BP 138/84 | HR 81 | Temp 97.0°F | Resp 18 | Ht 72.0 in | Wt 361.4 lb

## 2012-08-28 DIAGNOSIS — Z6841 Body Mass Index (BMI) 40.0 and over, adult: Secondary | ICD-10-CM | POA: Diagnosis not present

## 2012-08-28 DIAGNOSIS — G4733 Obstructive sleep apnea (adult) (pediatric): Secondary | ICD-10-CM | POA: Diagnosis not present

## 2012-08-28 DIAGNOSIS — E119 Type 2 diabetes mellitus without complications: Secondary | ICD-10-CM | POA: Diagnosis not present

## 2012-08-28 DIAGNOSIS — I10 Essential (primary) hypertension: Secondary | ICD-10-CM

## 2012-08-28 DIAGNOSIS — E785 Hyperlipidemia, unspecified: Secondary | ICD-10-CM

## 2012-08-28 NOTE — Patient Instructions (Signed)
We will start the workup

## 2012-08-29 ENCOUNTER — Encounter: Payer: Medicare Other | Attending: General Surgery | Admitting: *Deleted

## 2012-08-29 ENCOUNTER — Encounter: Payer: Self-pay | Admitting: *Deleted

## 2012-08-29 DIAGNOSIS — Z01818 Encounter for other preprocedural examination: Secondary | ICD-10-CM | POA: Insufficient documentation

## 2012-08-29 DIAGNOSIS — E669 Obesity, unspecified: Secondary | ICD-10-CM | POA: Insufficient documentation

## 2012-08-29 DIAGNOSIS — Z713 Dietary counseling and surveillance: Secondary | ICD-10-CM | POA: Insufficient documentation

## 2012-08-29 NOTE — Progress Notes (Signed)
  Pre-Op Assessment Visit:  Pre-Operative LAGB Surgery  Medical Nutrition Therapy:  Appt start time: 0900   End time:  1000.  Patient was seen on 08/29/2012 for Pre-Operative LAGB Nutrition Assessment. Assessment and letter of approval faxed to [facility] coordinator on 08/29/2012.  Approval letter sent to [facility] and will be available in the chart under the media tab.  Handouts given during visit include:  Pre-Op Goals   Bariatric Surgery Protein Shakes  Patient to call for Pre-Op and Post-Op Nutrition Education at the [facility] when surgery is scheduled.

## 2012-08-29 NOTE — Patient Instructions (Addendum)
   Follow Pre-Op Nutrition Goals to prepare for Lapband Surgery.   Call the [facility] at [phone] once you have been given your surgery date to enrolled in the Pre-Op Nutrition Class. You will need to attend this nutrition class 3-4 weeks prior to your surgery. 

## 2012-08-29 NOTE — Progress Notes (Signed)
Patient ID: [name], male   DOB: 11/01/1945, 67 y.o.   MRN: [id number]  Chief Complaint  Patient presents with  . Weight Loss Surgery    HPI [name] is a 67 y.o. male.   HPI 67 yo WM referred by Dr Marga Melnick for evaluation of weight loss surgery. Patient states that he is specifically interested in the laparoscopic adjustable gastric band procedure. He says that it interests him because it is a reversible procedure.   The patient states that he has struggled with his weight since the 1970s. Despite numerous attempts for sustained weight loss he has been unsuccessful. He has tried Weight Watchers on several occasions, Slim fast, hypnosis, as well as the Atkins diet all without any long-term success  He states that he has done a lot of on line research regarding weight loss procedures. He states that he was first encouraged to consider weight loss surgery by his psychiatrist Past Medical History  Diagnosis Date  . Benign prostatic hypertrophy   . Hypertension   . Gout   . Nephrolithiasis   . Sleep apnea   . Depression   . Complex renal cyst   . FH: colonic polyps     Past Surgical History  Procedure Laterality Date  . Total knee replacment      bilat  . Cholecystectomy  1989  . Trigger finger repair      also lue, cts surgically repaired  . Renal calculi    . Tonsillectomy    . Lithotripsy    . Mouth surgery    . Arthroscopy knee w/ drilling      Family History  Problem Relation Age of Onset  . Depression Mother   . Hypertension Mother   . Heart disease Father     MI  . Depression Brother   . Stroke Paternal Aunt     CVA  . Diabetes Paternal Uncle   . Heart disease Paternal Uncle     MI  . Heart disease Paternal Grandmother     MI  . Diabetes Cousin     PATERNAL    Social History History  Substance Use Topics  . Smoking status: Former Smoker    Types: Cigarettes    Quit date: 08/30/1974  . Smokeless tobacco: Not on file  . Alcohol  Use: Yes     Comment: Occasionally; 1 small drink a week    Allergies  Allergen Reactions  . Propoxyphene-Acetaminophen   . Codeine     nausea  . Morphine     nausea  . Penicillins     LOC with shot    Current Outpatient Prescriptions  Medication Sig Dispense Refill  . allopurinol (ZYLOPRIM) 300 MG tablet Take 300 mg by mouth daily.        [name] amLODipine-benazepril (LOTREL) 5-10 MG per capsule TAKE ONE CAPSULE BY MOUTH EVERY DAY  90 capsule  1  . aspirin 325 MG tablet Take 325 mg by mouth 2 (two) times daily.        . finasteride (PROSCAR) 5 MG tablet Take 5 mg by mouth daily.        [name] LORazepam (ATIVAN) 0.5 MG tablet TAKE 1 TAB IN THE MORNING AND 2 AT BEDTIME  90 tablet  2  . Multiple Vitamin (MULTIVITAMIN) tablet Take 1 tablet by mouth daily.        . pravastatin (PRAVACHOL) 40 MG tablet TAKE ONE TABLET BY MOUTH EVERY DAY  90 tablet  3  .  risperiDONE (RISPERDAL) 0.5 MG tablet Take 1 tablet (0.5 mg total) by mouth at bedtime.  90 tablet  0  . sertraline (ZOLOFT) 100 MG tablet TAKE ONE AND ONE-HALF TABLETS BY MOUTH EVERY DAY  135 tablet  0  . erythromycin base (E-MYCIN) 500 MG tablet Take 500 mg by mouth. Prior to dental work        No current facility-administered medications for this visit.    Review of Systems Review of Systems  Constitutional: Negative for fever, chills, appetite change and unexpected weight change.  HENT: Negative for congestion and trouble swallowing.   Eyes: Negative for visual disturbance.  Respiratory: Negative for chest tightness and shortness of breath.        Uses CPAP  Cardiovascular: Positive for leg swelling. Negative for chest pain.       No PND, no orthopnea; some DOE - able to walk around a store without DOE; but will get DOE with 1 flight  Gastrointestinal:       Had some polyps on colonoscopy. Denies GERD   Endocrine:       States he was told he is a pre-diabetic  Genitourinary: Negative for dysuria and hematuria.  Musculoskeletal:  Negative.        Has had b/l knee replacements by Dr Charlann Boxer. Will still get b/l knee pain if have to stand too long  Skin: Negative for rash.  Neurological: Negative for seizures and speech difficulty.  Hematological: Does not bruise/bleed easily.  Psychiatric/Behavioral: Negative for behavioral problems and confusion.       Takes meds for depression. Currently sees a psychiatrist    Blood pressure 138/84, pulse 81, temperature 97 F (36.1 C), temperature source Temporal, resp. rate 18, height 6' (1.829 m), weight 361 lb 6.4 oz (163.93 kg).  Physical Exam Physical Exam  Vitals reviewed. Constitutional: He is oriented to person, place, and time. He appears well-developed and well-nourished. No distress.  Morbidly obese.   HENT:  Head: Normocephalic and atraumatic.  Right Ear: External ear normal.  Left Ear: External ear normal.  Eyes: Conjunctivae and EOM are normal. No scleral icterus.  Neck: Neck supple. No tracheal deviation present. No thyromegaly present.  Cardiovascular: Normal rate, regular rhythm, normal heart sounds and intact distal pulses.   Pulmonary/Chest: Effort normal and breath sounds normal. No respiratory distress. He has no wheezes.  Abdominal: Soft. He exhibits no distension. There is no tenderness. There is no rebound.    Musculoskeletal: He exhibits edema. He exhibits no tenderness.  B/l LE pitting edema 1+  Lymphadenopathy:    He has no cervical adenopathy.  Neurological: He is alert and oriented to person, place, and time. He exhibits normal muscle tone.  Skin: Skin is warm and dry. No rash noted. He is not diaphoretic. No erythema.  Psychiatric: He has a normal mood and affect. His behavior is normal. Judgment and thought content normal.    Data Reviewed Dr Frederik Pear notes 2007 sleep apnea study - severe OSA  Assessment    Morbid obesity BMI 48.9 Obstructive sleep apnea on CPAP HTN Hyperlipidemia Metabolic syndrome Depression Degenerative joint  disease     Plan    The patient meets weight loss surgery criteria. I think the patient would be an acceptable candidate for Laparoscopic adjustable gastric band placement.  We discussed laparoscopic adjustable gastric banding. The patient was given Agricultural engineer. We discussed the risk and benefits of surgery including but not limited to bleeding, infection, injury to surrounding structures, blood clot formation such  as deep venous thrombosis or pulmonary embolism, need to convert to an open procedure, band slippage, band erosion, failure to loose weight, port complications (leak or flippage), potential need for reoperative surgery, esophageal dilatation, worsening reflux, and vitamin deficiencies. We discussed the typical post operative recovery course. We discussed that their postoperative diet will be modified for several weeks. We specifically talked about the need to be on a liquid diet for one to 2 weeks after surgery. We also discussed the typical postoperative course with a laparoscopic adjustable gastric band and the need for frequent postoperative visits to assess the volume status of the band.  We discussed the typical expected weight loss with a laparoscopic adjustable gastric band. I explained to the patient that they can expect to lose 40-60% of their excess body weight if they are compliant with their postoperative instructions. However I did explain that some patients loose less than 40% and some patients lose more than 60% of their excess body weight.  I explained that the likelihood of improvement in their obesity is good.  We also discussed laparoscopic vertical sleeve gastrectomy. We discussed the surgical steps, the postoperative course, the potential risk and complications of surgery as well as the expected weight loss.  He is still interested in the laparoscopic adjustable gastric band  I explained to the patient that we will start our evaluation process which includes  labs, Upper GI to evaluate stomach and swallowing anatomy, nutritionist consultation, a clearance letter from his psychiatrist, EKG, CXR.  Mary Sella. Andrey Campanile, MD, FACS General, Bariatric, & Minimally Invasive Surgery [facility], [place]           [facility] M 08/29/2012, 9:28 AM

## 2012-09-03 ENCOUNTER — Encounter (HOSPITAL_COMMUNITY): Payer: Self-pay | Admitting: Psychiatry

## 2012-09-03 ENCOUNTER — Ambulatory Visit (INDEPENDENT_AMBULATORY_CARE_PROVIDER_SITE_OTHER): Payer: Medicare Other | Admitting: Psychiatry

## 2012-09-03 VITALS — Wt 363.0 lb

## 2012-09-03 DIAGNOSIS — F29 Unspecified psychosis not due to a substance or known physiological condition: Secondary | ICD-10-CM | POA: Diagnosis not present

## 2012-09-03 DIAGNOSIS — F329 Major depressive disorder, single episode, unspecified: Secondary | ICD-10-CM | POA: Diagnosis not present

## 2012-09-03 DIAGNOSIS — F331 Major depressive disorder, recurrent, moderate: Secondary | ICD-10-CM

## 2012-09-03 MED ORDER — SERTRALINE HCL 100 MG PO TABS: ORAL_TABLET | ORAL | Status: DC

## 2012-09-03 MED ORDER — LORAZEPAM 0.5 MG PO TABS: ORAL_TABLET | ORAL | Status: DC

## 2012-09-03 MED ORDER — RISPERIDONE 0.5 MG PO TABS: 0.5000 mg | ORAL_TABLET | Freq: Every day | ORAL | Status: DC

## 2012-09-03 NOTE — Progress Notes (Signed)
Patient ID: [name], male   DOB: October 15, 1945, 67 y.o.   MRN: [id number] Chief complaint Medication management and followup.    History of present illness Patient is 67 year old who came for her followup appointment.  Patient recently seen Dr. Alwyn Ren.  He is now on diet to control his rate.  He is thinking to have bariatric surgery however he need to do weight loss program which he started past month.  He requires a psychiatric evaluation prior to bariatric surgery.  He is complying with his psychiatric medication.  He has any side effects.  He is in contact with Dr. Andrey Campanile who will perform bariatric surgery.  He is talking extensively about the lifestyle changes before and after the surgery.  Patient understands the risk of surgery.  He has done a lot of research on bariatric surgery.  Patient told this is the time he need to make decision about his physical health.  Patient continues to complain about joint pain due to excessive weight.  He has decrease social isolation and mobility.  However he denies any active or passive suicidal thoughts.  He is very worried about his weight and physical health.  Patient do not recall any eating disorder.  He denies any anger irritability crying spells or any mood swing.  He feels his current psychiatric medication working very well.  He denies any tremors or shakes.  He is not drinking or using any illegal substance.  Current psychiatric medication Zoloft 150 mg daily Risperdal 0.5 mg at bedtime Ativan 0.5 mg 1 in the morning and 2 at bedtime  Past psychiatric history Patient has one psychiatric admission in 2006 due to increase depression and having suicidal thinking. Since then patient has been seeing in this office and fairly stable on his medication.  Alcohol and substance use history Patient denies any history of alcohol and substance use.  Family history Patient endorsed mother and brother has history of depression.  Psychosocial  history Patient lives with his wife who has been supportive. He was working as a part-time at [employer] however he quit working do to knee pain.    Review of Systems  Constitutional:       Obesity  Musculoskeletal: Positive for back pain and joint pain.  Neurological: Negative for dizziness, seizures and loss of consciousness.  Psychiatric/Behavioral: Negative for depression, suicidal ideas and hallucinations. The patient is nervous/anxious.    Patient Active Problem List   Diagnosis Date Noted  . Morbid obesity 08/09/2012  . PARESTHESIA 11/02/2009  . PROSTATE SPECIFIC ANTIGEN, ELEVATED 11/02/2009  . COLONIC POLYPS, HX OF 11/02/2009  . HYPERLIPIDEMIA 09/10/2008  . DEPRESSION 09/10/2008  . SLEEP APNEA 09/10/2008  . NEPHROLITHIASIS, HX OF 09/10/2008  . DYSMETABOLIC SYNDROME X 08/06/2007  . GOUT 06/28/2006  . HTN (hypertension) 06/28/2006  . BENIGN PROSTATIC HYPERTROPHY 06/28/2006   his primary care physician is Dr. Alwyn Ren.   Mental status examination Patient is mildly obese man who is casually dressed and fairly groomed.  He is in pain however he is pleasant and cooperative.  He has difficulty walking due to pain .  His speech is clear and coherent. His thought process is logical linear and goal-directed. He maintained good eye contact. He denies any active or passive suicidal thinking and homicidal thinking. He denies any auditory or visual hallucination.  There were no flight of ideas or loose association.  His fund of knowledge is adequate.  There no psychotic symptoms present at this time. There no tremors or shakes  present. His attention and concentration is fair. He described his mood is okay and his affect is mood appropriate.  He's alert and oriented x3. His insight judgment and impulse control is okay.  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  He will need a letter as approval for appropriate candidate for bariatric surgery.  At this time I will not change any his psychiatric medication.  He would continue Zoloft 100 mg daily, respiratory 0.5 mg at bedtime and Ativan 0.5 mg 1 in the morning and 2 at bedtime.  Risk and benefit explain.  Discuss about the past history and current symptoms of depression.  Patient understands very well the risk and benefits of surgery and looking forward to have bariatric surgery in the future.  I recommend to call us back if his any question or concern if he feel worsening of the symptom.  I will see him again in 3 months.

## 2012-09-10 ENCOUNTER — Ambulatory Visit: Payer: Self-pay | Admitting: Internal Medicine

## 2012-09-11 ENCOUNTER — Ambulatory Visit (INDEPENDENT_AMBULATORY_CARE_PROVIDER_SITE_OTHER): Payer: Medicare Other | Admitting: Internal Medicine

## 2012-09-11 ENCOUNTER — Encounter: Payer: Self-pay | Admitting: Internal Medicine

## 2012-09-11 DIAGNOSIS — G609 Hereditary and idiopathic neuropathy, unspecified: Secondary | ICD-10-CM

## 2012-09-11 DIAGNOSIS — M26609 Unspecified temporomandibular joint disorder, unspecified side: Secondary | ICD-10-CM | POA: Diagnosis not present

## 2012-09-11 DIAGNOSIS — G629 Polyneuropathy, unspecified: Secondary | ICD-10-CM

## 2012-09-11 MED ORDER — GABAPENTIN 100 MG PO CAPS: ORAL_CAPSULE | ORAL | Status: DC

## 2012-09-11 NOTE — Assessment & Plan Note (Signed)
See labs 

## 2012-09-11 NOTE — Patient Instructions (Addendum)
Share record with weight loss medical staff .  Please review the medication list in the After Visit Summary provided.Please verify the medication name (this may be  brand or generic) & correct dosage. Write the name of the prescribing physician to the right of the medication and share this with all medical staff seen at each appointment. This will help provide continuity of care; help optimize therapeutic interventions;and help prevent drug:drug adverse reaction.  Peripheral neuropathy or nerve damage in the extremities can be caused by diabetes, thyroid disease, B-12 deficiency, and chronic infection. Historically neurosyphilis was a common cause but is almost unheard of today.  If symptoms fail to be adequately controlled with medication such as gabapentin; physical therapy evaluation is indicated to enhance balance & prevent falls. Tai chi is also valuable in improving balance. For persistent or progressive symptoms; Neurology referral is indicated.

## 2012-09-11 NOTE — Assessment & Plan Note (Signed)
Consider glucosamine sulfate 1500 mg daily for TMJ symptoms. Take this daily  for 4-6 weeks. This will rehydrate the cartilages.Soft or liquid diet if having pain at the temporomandibular joint. Use warm moist compresses to 3 times a day over the painful area .

## 2012-09-11 NOTE — Assessment & Plan Note (Signed)
As soon as the temporomandibular joint symptoms resolve; I recommend he resume his present healthy nutritional program. I would also recommend that he consider initiating cardiac exercises with stationary bike increasing to 30-45 minutes 3-4 times a week over 6-8 weeks

## 2012-09-11 NOTE — Progress Notes (Signed)
  Subjective:    Patient ID: [name], male    DOB: 17-Apr-1945, 68 y.o.   MRN: [id number]  HPI He is here to review the medically supervised weight loss documentation form . At the start of the program he weighed 372 pounds. On May 9 his weight was 365; he has now lost an additional 11 pounds  He is on a low glycemic carb, low sugar , heart healthy dietary program.  His list of current medications are noted in the after visit summary.  Comorbidities are listed in the problem list.  He is not on a regular exercise program but has been more physically active. He plans to start using his stationary bike.     Review of Systems  He has had soreness in the left TMJ area for the last 4-5 days. He questions whether this is related to use of the CPAP.  He denies any otic pain or discharge. He has had no progression of his hearing loss; he does wear hearing aids.  The numbness in his feet is persistent and has resulted in his falling     Objective:   Physical Exam General appearance:morbidly obese; no acute distress or increased work of breathing is present.  No  lymphadenopathy about the head, neck, or axilla noted.   Eyes: No conjunctival inflammation or lid edema is present. There is no scleral icterus.  Ears:  External ear exam shows no significant lesions or deformities.  Otoscopic examination reveals clear L canal with normal  tympanic membrane without bulging, retraction, inflammation or discharge.Hearing aids bilaterally  Nose:  External nasal examination shows no deformity or inflammation. Nasal mucosa are pink and moist without lesions or exudates. No septal dislocation or deviation.No obstruction to airflow.   Oral exam: Dental hygiene is good; lips and gums are healthy appearing.There is no oropharyngeal erythema or exudate noted. Crepitus and slight dislocation of the left TMJ with mastication maneuvers  Neck:  No deformities, thyromegaly, masses, or tenderness noted.     Heart:  Normal rate and regular rhythm. S1 and S2 normal without gallop, murmur, click, rub or other extra sounds.   Lungs:Chest clear to auscultation; no wheezes, rhonchi,rales ,or rubs present.No increased work of breathing.    Extremities:  No cyanosis or clubbing  noted . Posttraumatic changes right index finger distally. 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

## 2012-09-13 ENCOUNTER — Telehealth (HOSPITAL_COMMUNITY): Payer: Self-pay

## 2012-09-13 NOTE — Telephone Encounter (Signed)
1:03PM 09/13/12 Spoke with patient in reference to sigining a release of information to have a letter fax over for Bariatric Surgery - Patient stated he will come in next Monday to sign the form.Marland KitchenMarguerite Olea

## 2012-09-26 ENCOUNTER — Ambulatory Visit (HOSPITAL_COMMUNITY)
Admission: RE | Admit: 2012-09-26 | Discharge: 2012-09-26 | Disposition: A | Discharge status: Home / Self Care | Payer: Medicare Other | Source: Ambulatory Visit | Attending: General Surgery | Admitting: General Surgery

## 2012-09-26 ENCOUNTER — Other Ambulatory Visit: Payer: Self-pay

## 2012-09-26 DIAGNOSIS — K219 Gastro-esophageal reflux disease without esophagitis: Secondary | ICD-10-CM | POA: Diagnosis not present

## 2012-09-26 DIAGNOSIS — I10 Essential (primary) hypertension: Secondary | ICD-10-CM | POA: Insufficient documentation

## 2012-09-26 DIAGNOSIS — E8881 Metabolic syndrome: Secondary | ICD-10-CM | POA: Insufficient documentation

## 2012-09-26 DIAGNOSIS — K449 Diaphragmatic hernia without obstruction or gangrene: Secondary | ICD-10-CM | POA: Diagnosis not present

## 2012-09-26 DIAGNOSIS — M1990 Unspecified osteoarthritis, unspecified site: Secondary | ICD-10-CM | POA: Insufficient documentation

## 2012-09-26 DIAGNOSIS — G4733 Obstructive sleep apnea (adult) (pediatric): Secondary | ICD-10-CM

## 2012-09-26 DIAGNOSIS — Z01818 Encounter for other preprocedural examination: Secondary | ICD-10-CM | POA: Diagnosis not present

## 2012-09-26 DIAGNOSIS — E785 Hyperlipidemia, unspecified: Secondary | ICD-10-CM | POA: Diagnosis not present

## 2012-09-26 DIAGNOSIS — E66813 Obesity, class 3: Secondary | ICD-10-CM

## 2012-09-26 DIAGNOSIS — E119 Type 2 diabetes mellitus without complications: Secondary | ICD-10-CM

## 2012-09-26 DIAGNOSIS — Z6841 Body Mass Index (BMI) 40.0 and over, adult: Secondary | ICD-10-CM | POA: Insufficient documentation

## 2012-10-08 ENCOUNTER — Ambulatory Visit: Payer: Self-pay | Admitting: Internal Medicine

## 2012-10-09 ENCOUNTER — Encounter: Payer: Self-pay | Admitting: Internal Medicine

## 2012-10-09 ENCOUNTER — Ambulatory Visit (INDEPENDENT_AMBULATORY_CARE_PROVIDER_SITE_OTHER): Payer: Medicare Other | Admitting: Internal Medicine

## 2012-10-09 DIAGNOSIS — G629 Polyneuropathy, unspecified: Secondary | ICD-10-CM

## 2012-10-09 DIAGNOSIS — G609 Hereditary and idiopathic neuropathy, unspecified: Secondary | ICD-10-CM | POA: Diagnosis not present

## 2012-10-09 NOTE — Assessment & Plan Note (Signed)
He is compliant with the program and is increasing his exercise program. He has not gained any weight since last visit. Gradual titration of CVE recommended

## 2012-10-09 NOTE — Progress Notes (Signed)
  Subjective:    Patient ID: [name], male    DOB: 10-27-1945, 67 y.o.   MRN: [id number]  HPI  He is here as part of his medically supervised weight loss program which requires documentation of compliance. His initial weight when he entered the program was 372 pounds. His weight has dropped to 354 pounds as of 09/11/12 and has remained at this level.  He continues to restrict hyperglycemic carbohydrates and follows a modified heart healthy diet.  Medications were reviewed and no changes made  Comorbidities are in the problem list and were reviewed.  He is "easing" into cardiovascular exercise as stationary bike 15 minutes 3 times per week.   Review of Systems His left temporal mandibular joint discomfort has improved with the supplementation with glucosamine.  Tingling in his feet has responded to gabapentin 1 mg twice a day. Numbness persists. With the CVE  he denies chest pain, palpitations,  or claudication. He has occasional DOE     Objective:   Physical Exam Gen.: Morbid obesity but adequately nourished in appearance. Alert, appropriate and cooperative throughout exam. Eyes: No corneal or conjunctival inflammation noted.  Extraocular motion intact.  Ears: Hearing aids bilaterally. Nose: External nasal exam reveals no deformity or inflammation. Nasal mucosa are pink and moist.  Mouth: Oral mucosa and oropharynx reveal no lesions or exudates. Teeth in good repair. Neck: No deformities, masses, or tenderness noted.  Thyroid normal. Lungs: Normal respiratory effort; chest expands symmetrically. Lungs are clear to auscultation without rales, wheezes, or increased work of breathing. Heart: Normal rate and rhythm. Normal S1 and S2. No gallop, click, or rub. S4 w/o murmur. Abdomen: Bowel sounds normal; abdomen protuberant but nontender. No masses, organomegaly or hernias noted.                         Musculoskeletal/extremities: No clubbing, cyanosis,  or significant extremity   deformity noted. Range of motion normal .Tone & strength  Normal. Trace edema Joints  reveal minor post traumatic  DIP change  R index finger. Nail health good. Able to lie down & sit up w/o help.  Vascular: Carotid, radial artery, dorsalis pedis and  posterior tibial pulses are full and equal. No bruits present. Neurologic: Alert and oriented x3. Deep tendon reflexes symmetrical and 1/2+.      Skin: Intact without suspicious lesions or rashes. Lymph: No cervical, axillary lymphadenopathy present. Psych: Mood and affect are normal. Normally interactive                                                                                        Assessment & Plan:  See Current Assessment & Plan in Problem List under specific Diagnosis

## 2012-10-09 NOTE — Patient Instructions (Signed)
Cardiovascular exercise is recommended 30-45 minutes 3-4 times per week. You should take 6-8 weeks to build up to this level.

## 2012-10-16 ENCOUNTER — Telehealth: Payer: Self-pay | Admitting: *Deleted

## 2012-10-16 DIAGNOSIS — G629 Polyneuropathy, unspecified: Secondary | ICD-10-CM

## 2012-10-16 MED ORDER — GABAPENTIN 100 MG PO CAPS: ORAL_CAPSULE | ORAL | Status: DC

## 2012-10-16 NOTE — Telephone Encounter (Signed)
Rx sent 

## 2012-10-16 NOTE — Telephone Encounter (Signed)
90

## 2012-10-16 NOTE — Telephone Encounter (Signed)
Please advise about patient refill of gabapentin 100 mg caps

## 2012-10-16 NOTE — Addendum Note (Signed)
Addended by: Candie Echevaria L on: 10/16/2012 04:48 PM   Modules accepted: Orders

## 2012-10-22 ENCOUNTER — Other Ambulatory Visit: Payer: Self-pay | Admitting: Internal Medicine

## 2012-11-06 ENCOUNTER — Other Ambulatory Visit: Payer: Self-pay

## 2012-11-06 ENCOUNTER — Encounter: Payer: Self-pay | Admitting: Internal Medicine

## 2012-11-06 ENCOUNTER — Ambulatory Visit (INDEPENDENT_AMBULATORY_CARE_PROVIDER_SITE_OTHER): Payer: Medicare Other | Admitting: Internal Medicine

## 2012-11-06 DIAGNOSIS — E8881 Metabolic syndrome: Secondary | ICD-10-CM | POA: Diagnosis not present

## 2012-11-06 DIAGNOSIS — I10 Essential (primary) hypertension: Secondary | ICD-10-CM

## 2012-11-06 DIAGNOSIS — M109 Gout, unspecified: Secondary | ICD-10-CM | POA: Diagnosis not present

## 2012-11-06 DIAGNOSIS — E785 Hyperlipidemia, unspecified: Secondary | ICD-10-CM

## 2012-11-06 NOTE — Assessment & Plan Note (Signed)
Form completed; he is compliant & motivated. Prognosis good

## 2012-11-06 NOTE — Patient Instructions (Addendum)
Share results with all non [facility] medical staff seen  

## 2012-11-06 NOTE — Progress Notes (Signed)
  Subjective:    Patient ID: [name], male    DOB: [birth year], 67 y.o.   MRN: [id number]  HPI   He is here for followup of his medically supervised weight loss documentation.  Current weight is 348 down 7 pounds since his last month's visit. Weight up to start a program 372 pounds.  Comorbidities are unchanged and listed under diagnoses  His med list was updated. As SBE prophylaxis he now takes azithromycin rather than erythromycin. This is necessitated by his history of bilateral total knee replacements.  He's following a heart healthy diet.  He uses a stationary bike 15 minutes 3-4 times per week w/o symptoms.  He is trying to build up his willpower; he will eat less than 4 [employer]  ice cream bars per month. He describes this as his "fall back" to prevent indiscriminate eating which was a characteristic previously.    Review of Systems  With the exercise he denies chest pain, palpitations, dyspnea, or claudication. He denies paroxysmal nocturnal dyspnea; but he is on CPAP. Edema is stable.  Gabapentin 100 mg 2 pills twice a day controls or tingling. Numbness is unchanged.      Objective:   Physical Exam General appearance :morbity obese; in no distress.  Eyes: No conjunctival inflammation or scleral icterus is present.  Thyroid normal  Oral exam: Dental hygiene is good; lips and gums are healthy appearing.There is no oropharyngeal erythema or exudate noted.   Heart:  Normal rate and regular rhythm. S1 and S2 normal without gallop,  click, rub or other extra sounds . R base systolic murmur   Lungs:Chest clear to auscultation; no wheezes, rhonchi,rales ,or rubs present.No increased work of breathing.   Abdomen: bowel sounds normal, soft and non-tender without masses, organomegaly or hernias noted.  Massive abdomen.No guarding or rebound   Trace- 1/2 + edema   Skin:Warm & dry.  Intact without suspicious lesions or rashes ; no jaundice or tenting  Lymphatic:  No lymphadenopathy is noted about the head, neck, axilla  DTRs 1/2+ @ knees            Assessment & Plan:  See Current Assessment & Plan in Problem List under specific Diagnosis

## 2012-11-13 ENCOUNTER — Other Ambulatory Visit: Payer: Self-pay | Admitting: *Deleted

## 2012-11-13 DIAGNOSIS — G629 Polyneuropathy, unspecified: Secondary | ICD-10-CM

## 2012-11-13 MED ORDER — GABAPENTIN 100 MG PO CAPS: ORAL_CAPSULE | ORAL | Status: DC

## 2012-11-13 NOTE — Telephone Encounter (Signed)
Rx was refilled for gabapentin 100 mg as per Dr. Alwyn Ren. Ag cma

## 2012-11-27 ENCOUNTER — Other Ambulatory Visit (HOSPITAL_COMMUNITY): Payer: Self-pay | Admitting: Psychiatry

## 2012-11-28 DIAGNOSIS — Z125 Encounter for screening for malignant neoplasm of prostate: Secondary | ICD-10-CM | POA: Diagnosis not present

## 2012-11-28 DIAGNOSIS — N400 Enlarged prostate without lower urinary tract symptoms: Secondary | ICD-10-CM | POA: Diagnosis not present

## 2012-11-28 DIAGNOSIS — N200 Calculus of kidney: Secondary | ICD-10-CM | POA: Diagnosis not present

## 2012-11-28 DIAGNOSIS — R972 Elevated prostate specific antigen [PSA]: Secondary | ICD-10-CM | POA: Diagnosis not present

## 2012-11-28 DIAGNOSIS — N281 Cyst of kidney, acquired: Secondary | ICD-10-CM | POA: Diagnosis not present

## 2012-11-29 LAB — PSA: PSA: 0.42

## 2012-12-04 ENCOUNTER — Encounter (HOSPITAL_COMMUNITY): Payer: Self-pay | Admitting: Psychiatry

## 2012-12-04 ENCOUNTER — Ambulatory Visit (INDEPENDENT_AMBULATORY_CARE_PROVIDER_SITE_OTHER): Payer: Medicare Other | Admitting: Psychiatry

## 2012-12-04 VITALS — BP 155/73 | HR 71 | Ht 74.0 in | Wt 354.4 lb

## 2012-12-04 DIAGNOSIS — F339 Major depressive disorder, recurrent, unspecified: Secondary | ICD-10-CM

## 2012-12-04 DIAGNOSIS — F331 Major depressive disorder, recurrent, moderate: Secondary | ICD-10-CM

## 2012-12-04 MED ORDER — LORAZEPAM 0.5 MG PO TABS: ORAL_TABLET | ORAL | Status: DC

## 2012-12-04 MED ORDER — RISPERIDONE 0.5 MG PO TABS: 0.5000 mg | ORAL_TABLET | Freq: Every day | ORAL | Status: DC

## 2012-12-04 MED ORDER — SERTRALINE HCL 100 MG PO TABS: ORAL_TABLET | ORAL | Status: DC

## 2012-12-04 NOTE — Progress Notes (Signed)
[facility] [id number] Progress Note  [name] [id number] 67 y.o.  12/04/2012 1:44 PM  Chief Complaint:  I am doing better on the medication.  I lost some weight.  History of Present Illness:  Patient is a 67 year old Caucasian unemployed man who came for his followup appointment.  He is compliant with his psychiatric medication.  He is seeing Dr. Alwyn Ren for his weight loss program.  He's excited that he has lost weight in the past few months.  He is sleeping better.  Sometimes he has nightmares and dreamed but he does not want to change his medication.  He endorses a weight loss program does not work he is thinking seriously to have bariatric surgery.  He has done a lot of research.  Overall his mood has been stable.  He denies any irritability anger and mood swings.  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  He is not drinking or using any illegal substance.  He denies any tremors or shakes.  Suicidal Ideation: No Plan Formed: No Patient has means to carry out plan: No  Homicidal Ideation: No Plan Formed: No Patient has means to carry out plan: No  Review of Systems: Psychiatric: Agitation: No Hallucination: No Depressed Mood: No Insomnia: No Hypersomnia: No Altered Concentration: No Feels Worthless: No Grandiose Ideas: No Belief In Special Powers: No New/Increased Substance Abuse: No Compulsions: No  Neurologic: Headache: No Seizure: No Paresthesias: No  Past Medical History  Diagnosis Date  . Benign prostatic hypertrophy   . Hypertension   . Gout   . Nephrolithiasis   . Sleep apnea   . Depression   . Complex renal cyst   . FH: colonic polyps   . Asthma   . Morbid obesity   Patient see Dr. Alwyn Ren.  Psychosocial History: Patient lives with his wife.  His wife is very supportive.  Outpatient Encounter Prescriptions as of 12/04/2012  Medication Sig Dispense Refill  . allopurinol (ZYLOPRIM) 300 MG tablet Take 300 mg by mouth daily.         [name] amLODipine-benazepril (LOTREL) 5-10 MG per capsule TAKE ONE CAPSULE BY MOUTH EVERY DAY  90 capsule  1  . aspirin 325 MG tablet Take 325 mg by mouth 2 (two) times daily.        . finasteride (PROSCAR) 5 MG tablet Take 5 mg by mouth daily.        [name] gabapentin (NEURONTIN) 100 MG capsule One pill every eight hours as needed; dose may be increased by one pill each dose after 72 hours if only partially effective  90 capsule  0  . Glucosamine HCl (GLUCOSAMINE PO) Take by mouth daily.      [name] LORazepam (ATIVAN) 0.5 MG tablet TAKE 1 TAB IN THE MORNING AND 2 AT BEDTIME  90 tablet  2  . Multiple Vitamin (MULTIVITAMIN) tablet Take 1 tablet by mouth daily.        . pravastatin (PRAVACHOL) 40 MG tablet TAKE ONE TABLET BY MOUTH EVERY DAY  90 tablet  3  . risperiDONE (RISPERDAL) 0.5 MG tablet Take 1 tablet (0.5 mg total) by mouth at bedtime.  90 tablet  0  . sertraline (ZOLOFT) 100 MG tablet Take 1 and 1/2 tab daily  135 tablet  0  . [DISCONTINUED] LORazepam (ATIVAN) 0.5 MG tablet TAKE 1 TAB IN THE MORNING AND 2 AT BEDTIME  90 tablet  2  . [DISCONTINUED] risperiDONE (RISPERDAL) 0.5 MG tablet Take 1 tablet (0.5 mg total)  by mouth at bedtime.  90 tablet  0  . [DISCONTINUED] sertraline (ZOLOFT) 100 MG tablet 1 by mouth in the am, 2 by mouth at bedtime      . [DISCONTINUED] azithromycin (ZITHROMAX) 250 MG tablet Take 250 mg by mouth. For dental work       No facility-administered encounter medications on file as of 12/04/2012.    Recent Results (from the past 2160 hour(s))  HEMOGLOBIN A1C     Status: None   Collection Time    09/11/12  2:53 PM      Result Value Range   Hemoglobin A1C 5.8  4.6 - 6.5 %   Comment: Glycemic Control Guidelines for People with Diabetes:Non Diabetic:  <6%Goal of Therapy: <7%Additional Action Suggested:  >8%   VITAMIN B12     Status: None   Collection Time    09/11/12  2:53 PM      Result Value Range   Vitamin B-12 396  211 - 911 pg/mL  TSH     Status: None   Collection Time     09/11/12  2:53 PM      Result Value Range   TSH 1.60  0.35 - 5.50 uIU/mL  RPR     Status: None   Collection Time    09/11/12  2:53 PM      Result Value Range   RPR NON REAC  NON REAC    Past Psychiatric History/Hospitalization(s) Patient has one psychiatric admission in 2006 due to increased depression and having suicidal thoughts.  Since then patient has been seen in this office and feeling stable on his psychiatric medication. Anxiety: Yes Bipolar Disorder: No Depression: Yes Mania: No Psychosis: No Schizophrenia: No Personality Disorder: No Hospitalization for psychiatric illness: Yes History of Electroconvulsive Shock Therapy: No Prior Suicide Attempts: No  Physical Exam: Constitutional:  BP 155/73  Pulse 71  Ht 6\' 2"  (1.88 m)  Wt 354 lb 6.4 oz (160.755 kg)  BMI 45.48 kg/m2  Musculoskeletal: Strength & Muscle Tone: within normal limits Gait & Station: normal Patient leans: N/A  Mental Status Examination;  Patient is obese man who is casually dressed and fairly groomed.  He maintains good eye contact.  He is pleasant and cooperative.  His thought process is slowed but logical linear and goal directed.  There were no flight if ideas of any loose association.  He denies any active or passive suicidal thoughts and homicidal thoughts.  His fund of knowledge is adequate.  There were no paranoia or delusions present at this time.  His attention concentration is fair.  He is alert and oriented x3.  He described his mood as okay and his affect is mood appropriate.  His insight judgment and impulse control is okay.   Medical Decision Making (Choose Three): Established Problem, Stable/Improving (1), Review or order clinical lab tests (1), Review of Last Therapy Session (1) and Review of Medication Regimen & Side Effects (2)  Assessment: Axis I: Maj. depressive disorder, recurrent  Axis II: Deferred  Axis III:  Past Medical History  Diagnosis Date  . Benign prostatic  hypertrophy   . Hypertension   . Gout   . Nephrolithiasis   . Sleep apnea   . Depression   . Complex renal cyst   . FH: colonic polyps   . Asthma   . Morbid obesity     Axis IV: Mild   Plan:  I refused blood work, his hemoglobin A1c is 5.8.  I will continue his current  psychiatric medication which is Zoloft 150 mg daily, Risperdal 0.5 mg at bedtime and Ativan 0.5 mg 1 in the morning and 2 at bedtime.  Recommend to call us back if he has any questions or concerns otherwise I will see him again in 3 months.    Jud Fanguy T., MD 12/04/2012

## 2012-12-05 DIAGNOSIS — G4733 Obstructive sleep apnea (adult) (pediatric): Secondary | ICD-10-CM | POA: Diagnosis not present

## 2012-12-05 DIAGNOSIS — E119 Type 2 diabetes mellitus without complications: Secondary | ICD-10-CM | POA: Diagnosis not present

## 2012-12-05 LAB — COMPREHENSIVE METABOLIC PANEL
Alkaline Phosphatase: 64 U/L (ref 39–117)
BUN: 22 mg/dL (ref 6–23)
CO2: 25 mEq/L (ref 19–32)
Creat: 1.12 mg/dL (ref 0.50–1.35)
Glucose, Bld: 116 mg/dL — ABNORMAL HIGH (ref 70–99)
Total Bilirubin: 0.5 mg/dL (ref 0.3–1.2)
Total Protein: 6.2 g/dL (ref 6.0–8.3)

## 2012-12-06 LAB — CBC WITH DIFFERENTIAL/PLATELET
Basophils Relative: 1 % (ref 0–1)
Eosinophils Absolute: 0.1 10*3/uL (ref 0.0–0.7)
Eosinophils Relative: 2 % (ref 0–5)
HCT: 42.2 % (ref 39.0–52.0)
Hemoglobin: 14.3 g/dL (ref 13.0–17.0)
Lymphs Abs: 1.4 10*3/uL (ref 0.7–4.0)
MCH: 30.2 pg (ref 26.0–34.0)
MCHC: 33.9 g/dL (ref 30.0–36.0)
MCV: 89.2 fL (ref 78.0–100.0)
Monocytes Absolute: 0.7 10*3/uL (ref 0.1–1.0)
Monocytes Relative: 8 % (ref 3–12)
Neutrophils Relative %: 72 % (ref 43–77)
RBC: 4.73 MIL/uL (ref 4.22–5.81)

## 2012-12-06 LAB — LIPID PANEL
HDL: 34 mg/dL — ABNORMAL LOW (ref >39)
LDL Cholesterol: 77 mg/dL (ref 0–99)
Triglycerides: 137 mg/dL (ref <150)

## 2012-12-06 LAB — T4: T4, Total: 6.5 ug/dL (ref 5.0–12.5)

## 2012-12-06 LAB — TSH: TSH: 1.731 u[IU]/mL (ref 0.350–4.500)

## 2012-12-06 LAB — HEMOGLOBIN A1C: Hgb A1c MFr Bld: 6.1 % — ABNORMAL HIGH (ref <5.7)

## 2012-12-06 LAB — H. PYLORI ANTIBODY, IGG: H Pylori IgG: 4.77 {ISR} — ABNORMAL HIGH

## 2012-12-11 ENCOUNTER — Encounter: Payer: Self-pay | Admitting: Internal Medicine

## 2012-12-11 ENCOUNTER — Ambulatory Visit (INDEPENDENT_AMBULATORY_CARE_PROVIDER_SITE_OTHER): Payer: Medicare Other | Admitting: Internal Medicine

## 2012-12-11 DIAGNOSIS — G629 Polyneuropathy, unspecified: Secondary | ICD-10-CM

## 2012-12-11 DIAGNOSIS — G609 Hereditary and idiopathic neuropathy, unspecified: Secondary | ICD-10-CM

## 2012-12-11 DIAGNOSIS — E785 Hyperlipidemia, unspecified: Secondary | ICD-10-CM | POA: Diagnosis not present

## 2012-12-11 DIAGNOSIS — M109 Gout, unspecified: Secondary | ICD-10-CM

## 2012-12-11 DIAGNOSIS — M722 Plantar fascial fibromatosis: Secondary | ICD-10-CM

## 2012-12-11 DIAGNOSIS — G4730 Sleep apnea, unspecified: Secondary | ICD-10-CM

## 2012-12-11 DIAGNOSIS — I10 Essential (primary) hypertension: Secondary | ICD-10-CM | POA: Diagnosis not present

## 2012-12-11 MED ORDER — GABAPENTIN 100 MG PO CAPS: ORAL_CAPSULE | ORAL | Status: DC

## 2012-12-11 NOTE — Progress Notes (Signed)
  Subjective:    Patient ID: [name], male    DOB: [birth year], 67 y.o.   MRN: [id number]  HPI  The comorbidities associated with his morbid obesity are listed in the problem list.  His current medication list was verified; no change in his medications.  He is on a heart healthy nutritional program which is modified low carb as his wife is diabetic.  He been exercising 3 times a week her sister about 25 minutes.      Review of Systems  With the cardiovascular exercise he denies chest pain, palpitations, dyspnea, or claudication.  He has had some arch pain in his left foot; he relates this to an injury while exercising in the gym remotely.     Objective:   Physical Exam General appearance: morbid obesity; w/o distress.  Eyes: No conjunctival inflammation or scleral icterus is present.  Oral exam: Dental hygiene is good; lips and gums are healthy appearing.There is no oropharyngeal erythema or exudate noted.   Heart:  Normal rate and regular rhythm. S1 and S2 normal without gallop, murmur, click, rub or other extra sounds. S4   Lungs:Chest clear to auscultation; no wheezes, rhonchi,rales ,or rubs present.No increased work of breathing.   Abdomen: bowel sounds normal, massive but soft and non-tender without masses, organomegaly or hernias noted.  No guarding or rebound   Skin:Warm & dry.  Intact without suspicious lesions or rashes ; no jaundice  Lymphatic: No lymphadenopathy is noted about the head, neck, axilla .  Arches are slightly low. There's no tenderness to percussion over the plantar fascia bilaterally            Assessment & Plan:  #1 see problem list  Number to plantar injury in  Plan: Nutritional intensivigation discussed.  Exercises for the sole pain discussed at

## 2012-12-11 NOTE — Assessment & Plan Note (Signed)
Efficacy of low carb diet for weight loss and diabetes prevention as per recent literature  discussed

## 2012-12-11 NOTE — Patient Instructions (Addendum)
Roll the affected foot over a tennis ball 20 times twice a day. After this soak the foot in warm Epsom salts for 15-20 minutes. Wear arch supports in both shoes. Podiatry referral if symptoms persist. Continue glucosamine sulfate 1500 mg daily for the fasciitis ;this will rehydrate the cartilages.

## 2012-12-12 ENCOUNTER — Telehealth (INDEPENDENT_AMBULATORY_CARE_PROVIDER_SITE_OTHER): Payer: Self-pay

## 2012-12-12 ENCOUNTER — Telehealth (INDEPENDENT_AMBULATORY_CARE_PROVIDER_SITE_OTHER): Payer: Self-pay | Admitting: General Surgery

## 2012-12-12 NOTE — Telephone Encounter (Signed)
Message copied by June Leap on Thu Dec 12, 2012 11:11 AM ------      Message from: Andrey Campanile, ERIC M      Created: Wed Dec 11, 2012 11:45 AM       pls set up pt for H pylori breath test since his h pylori blood test was positive ------

## 2012-12-12 NOTE — Telephone Encounter (Signed)
Breath Tek scheduled for patient @ [name] on 12/23/2012-Patient to arrive @ 7:00, test @ 7:45 am.  Booking # [phone]

## 2012-12-12 NOTE — Telephone Encounter (Signed)
Breath tek test schd for 9/22 arrival 7:00 for a 7:45 [facility]/ Jill/ patient notified of test date and time and not to take any acid reducers or antacids from now until test date..patient verbalized agreement

## 2012-12-23 ENCOUNTER — Encounter (HOSPITAL_COMMUNITY)
Admission: RE | Disposition: A | Discharge status: Home / Self Care | Payer: Self-pay | Source: Ambulatory Visit | Attending: General Surgery

## 2012-12-23 ENCOUNTER — Ambulatory Visit (HOSPITAL_COMMUNITY)
Admission: RE | Admit: 2012-12-23 | Discharge: 2012-12-23 | Disposition: A | Discharge status: Home / Self Care | Payer: Medicare Other | Source: Ambulatory Visit | Attending: General Surgery | Admitting: General Surgery

## 2012-12-23 HISTORY — PX: BREATH TEK H PYLORI: SHX5422

## 2012-12-23 SURGERY — BREATH TEST, FOR HELICOBACTER PYLORI

## 2012-12-24 ENCOUNTER — Encounter (HOSPITAL_COMMUNITY): Payer: Self-pay | Admitting: General Surgery

## 2013-01-01 ENCOUNTER — Ambulatory Visit: Payer: Self-pay | Admitting: Internal Medicine

## 2013-01-08 ENCOUNTER — Ambulatory Visit (INDEPENDENT_AMBULATORY_CARE_PROVIDER_SITE_OTHER): Payer: Medicare Other | Admitting: Internal Medicine

## 2013-01-08 ENCOUNTER — Encounter: Payer: Self-pay | Admitting: Internal Medicine

## 2013-01-08 DIAGNOSIS — G4730 Sleep apnea, unspecified: Secondary | ICD-10-CM | POA: Diagnosis not present

## 2013-01-08 DIAGNOSIS — I10 Essential (primary) hypertension: Secondary | ICD-10-CM | POA: Diagnosis not present

## 2013-01-08 DIAGNOSIS — F329 Major depressive disorder, single episode, unspecified: Secondary | ICD-10-CM

## 2013-01-08 NOTE — Patient Instructions (Addendum)
Eat a low-fat diet with lots of fruits and vegetables, up to 7-9 servings per day. Consume less than 40   Grams (preferably ZERO) of sugar per day from foods & drinks with High Fructose Corn Syrup (HFCS) sugar as #1,2,3 or # 4 on label.Whole Foods, [employer] & [employer] do not carry products with HFCS. Follow a  low carb nutrition program such as  The New Sugar Busters  to prevent Diabetes progression . White carbohydrates (potatoes, rice, bread, and pasta) have a high spike of sugar and a high load of sugar. For example a  baked potato has a cup of sugar and a  french fry  2 teaspoons of sugar. Yams, wild  rice, whole grained bread &  wheat pasta have been much lower spike and load of  sugar. Portions should be the size of a deck of cards or your palm. Weigh @ least weekly or up to every other day & record each week's weights.

## 2013-01-08 NOTE — Assessment & Plan Note (Signed)
   He was asked to discuss his focus and motivation with Dr Lolly Mustache

## 2013-01-08 NOTE — Progress Notes (Signed)
  Subjective:    Patient ID: [name], male    DOB: 06-02-1945, 67 y.o.   MRN: [id number]  HPI  He returns for documentation of compliance as required by the medically supervised weight loss program.  His related comorbidities are noted in the problem list. An updated medication list will also be provided.   His weight is stable; he does not monitor his weight at home.  He is on a stationary bike 30 minutes 3 times a week without symptoms other than dyspnea.      Review of Systems  He does have exertional dyspnea with exercise as noted ; but he denies chest pain, palpitations, or claudication.  He expresses concerns about motivation. He has not had an appointment to discuss this with Dr Lolly Mustache, his Psychiatrist. We discussed the importance of focusing on adherence with attempts at weight loss if he  is to proceed with the bariatric surgery.  TSH was therapeutic 12/05/12     Objective:   Physical Exam Gen.: Morbidly obese in appearance. Alert, appropriate and cooperative throughout exam.  Head: Normocephalic without obvious abnormalities;  no alopecia  Eyes: No corneal or conjunctival inflammation noted. Nose: External nasal exam reveals no deformity or inflammation. Nasal mucosa are pink and moist. No lesions or exudates noted.   Mouth: Oral mucosa and oropharynx reveal no lesions or exudates. Teeth in good repair. Neck: No deformities, masses, or tenderness noted. Thyroid normal. Lungs: Normal respiratory effort; chest expands symmetrically. Lungs are clear to auscultation without rales, wheezes, or increased work of breathing. Heart: Normal rate and rhythm. Normal S1 and S2. No gallop, click, or rub.S4 w/o murmur. Abdomen: Bowel sounds normal; abdomen massive but soft and nontender. No masses, organomegaly or hernias noted.                                 Musculoskeletal/extremities:  No clubbing, cyanosis,  or significant extremity  deformity noted. Range of motion  normal .Tone & strength  Normal. 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 Nail health good. Able to lie down & sit up with some help.  Vascular: Carotid, radial artery, dorsalis pedis and  posterior tibial pulses are full and equal. No bruits present. Neurologic: Alert and oriented x3. Deep tendon reflexes symmetrical and normal.          Skin: Intact without suspicious lesions or rashes. Lymph: No cervical, axillary lymphadenopathy present. Psych: Mood and affect are normal. Normally interactive                                                                                        Assessment & Plan:  See Current Assessment & Plan in Problem List under specific Diagnosis

## 2013-01-20 ENCOUNTER — Other Ambulatory Visit: Payer: Self-pay | Admitting: *Deleted

## 2013-01-20 DIAGNOSIS — G629 Polyneuropathy, unspecified: Secondary | ICD-10-CM

## 2013-01-20 MED ORDER — GABAPENTIN 100 MG PO CAPS: ORAL_CAPSULE | ORAL | Status: DC

## 2013-01-20 NOTE — Telephone Encounter (Signed)
Gabapentin refill sent to pharmacy.

## 2013-01-29 ENCOUNTER — Ambulatory Visit: Payer: Self-pay | Admitting: Internal Medicine

## 2013-02-03 ENCOUNTER — Other Ambulatory Visit: Payer: Self-pay | Admitting: *Deleted

## 2013-02-03 DIAGNOSIS — G629 Polyneuropathy, unspecified: Secondary | ICD-10-CM

## 2013-02-03 MED ORDER — GABAPENTIN 100 MG PO CAPS: ORAL_CAPSULE | ORAL | Status: DC

## 2013-02-04 ENCOUNTER — Telehealth (HOSPITAL_COMMUNITY): Payer: Self-pay

## 2013-02-04 NOTE — Telephone Encounter (Signed)
I returned patient's phone call.  He is wondering what medicine can cause weight gain.  I explained that Risperdal and Neurontin can cause weight gain.  Patient will discuss with his primary care physician for the weight loss program.

## 2013-02-05 ENCOUNTER — Ambulatory Visit: Payer: Self-pay | Admitting: Internal Medicine

## 2013-02-05 ENCOUNTER — Encounter: Payer: Self-pay | Admitting: Internal Medicine

## 2013-02-05 ENCOUNTER — Ambulatory Visit (INDEPENDENT_AMBULATORY_CARE_PROVIDER_SITE_OTHER): Payer: Medicare Other | Admitting: Internal Medicine

## 2013-02-05 DIAGNOSIS — R7309 Other abnormal glucose: Secondary | ICD-10-CM

## 2013-02-05 DIAGNOSIS — E785 Hyperlipidemia, unspecified: Secondary | ICD-10-CM | POA: Diagnosis not present

## 2013-02-05 DIAGNOSIS — G629 Polyneuropathy, unspecified: Secondary | ICD-10-CM

## 2013-02-05 DIAGNOSIS — I10 Essential (primary) hypertension: Secondary | ICD-10-CM

## 2013-02-05 DIAGNOSIS — G609 Hereditary and idiopathic neuropathy, unspecified: Secondary | ICD-10-CM

## 2013-02-05 DIAGNOSIS — R7303 Prediabetes: Secondary | ICD-10-CM | POA: Insufficient documentation

## 2013-02-05 NOTE — Assessment & Plan Note (Signed)
LDL @ goal; HDL slightly low Interventions to raise HDL or GOOD cholesterol include: exercising 30-45 minutes 3-4 X per week ; dietary sources of Omega 3 fatty acids ( salmon & tuna) or  supplementing with Flax seed  1-2 grams per day.

## 2013-02-05 NOTE — Patient Instructions (Signed)
Interventions to raise HDL or GOOD cholesterol include: exercising 30-45 minutes 3-4 X per week ; dietary sources of Omega 3 fatty acids ( salmon & tuna) or  supplementing with Flax seed  1-2 grams per day.

## 2013-02-05 NOTE — Progress Notes (Signed)
  Subjective:    Patient ID: [name], male    DOB: 06-02-1945, 67 y.o.   MRN: [id number]  HPI He returns as part of the medically supervised weight loss documentation program. There's been no change in his medications, nutrition, or exercise program.   He did discuss any potential adverse effects of medications on weight  with Dr.Arfeen. The risperdol & gabapentin would be the only medications in question. He is also on Sertraline 100 mg 1& 1/2 daily.  Comorbidities in the problem list  as well as current medications were reviewed.    Review of Systems A heart healthy diet is followed; exercise encompasses 30 minutes 3  times per week as stationary bike without symptoms except DOE.  Advanced cholesterol testing reveals  LDL goal is less than 100 ; ideally < 70 . In September 2014 LDL was 77 and HDL 34. His A1c was 6.1 in September of this year There is medication compliance with the statin.  High dose ASA taken bid Specifically denied are  chest pain, palpitations,  or claudication.  Significant abdominal symptoms, memory deficit, or myalgias not present.      Objective:   Physical Exam General appearance: morbidly obese; w/o distress.  Eyes: No conjunctival inflammation or scleral icterus is present.  Oral exam: Dental hygiene is good; lips and gums are healthy appearing.There is no oropharyngeal erythema or exudate noted.   Heart:  Slow rate and regular rhythm. S1 and S2 normal without gallop, murmur, click, rub or other extra sounds     Lungs:Chest clear to auscultation; no wheezes, rhonchi,rales ,or rubs present.No increased work of breathing.   Abdomen: bowel sounds normal,protuberant but  soft and non-tender without masses, organomegaly or hernias noted.  No guarding or rebound . No tenderness over the flanks to percussion  Musculoskeletal: Able to lie flat and sit up without help. Negative straight leg raising bilaterally. Gait normal  1/2- 1 + ankle edema   All  pulses intact without  bruits .No ischemic skin changes.   Skin:Warm & dry.  Intact without suspicious lesions or rashes ; no jaundice or tenting  Lymphatic: No lymphadenopathy is noted about the head, neck, axilla.                Assessment & Plan:  See Current Assessment & Plan in Problem List under specific Diagnosis

## 2013-02-05 NOTE — Assessment & Plan Note (Signed)
BP controlled; no change 

## 2013-02-05 NOTE — Assessment & Plan Note (Deleted)
A1c

## 2013-02-06 ENCOUNTER — Other Ambulatory Visit: Payer: Self-pay

## 2013-02-20 ENCOUNTER — Telehealth (INDEPENDENT_AMBULATORY_CARE_PROVIDER_SITE_OTHER): Payer: Self-pay | Admitting: General Surgery

## 2013-02-20 NOTE — Telephone Encounter (Signed)
Pt requested change in surgery date until jan 2015

## 2013-02-26 ENCOUNTER — Ambulatory Visit: Payer: Self-pay | Admitting: Internal Medicine

## 2013-03-05 ENCOUNTER — Encounter (HOSPITAL_COMMUNITY): Payer: Self-pay | Admitting: Psychiatry

## 2013-03-05 ENCOUNTER — Ambulatory Visit (INDEPENDENT_AMBULATORY_CARE_PROVIDER_SITE_OTHER): Payer: Medicare Other | Admitting: Psychiatry

## 2013-03-05 ENCOUNTER — Other Ambulatory Visit (HOSPITAL_COMMUNITY): Payer: Self-pay | Admitting: Psychiatry

## 2013-03-05 VITALS — BP 134/81 | HR 53 | Ht 74.0 in | Wt 360.0 lb

## 2013-03-05 DIAGNOSIS — F339 Major depressive disorder, recurrent, unspecified: Secondary | ICD-10-CM

## 2013-03-05 DIAGNOSIS — F331 Major depressive disorder, recurrent, moderate: Secondary | ICD-10-CM

## 2013-03-05 MED ORDER — RISPERIDONE 0.5 MG PO TABS: 0.5000 mg | ORAL_TABLET | Freq: Every day | ORAL | Status: DC

## 2013-03-05 MED ORDER — LORAZEPAM 0.5 MG PO TABS: ORAL_TABLET | ORAL | Status: DC

## 2013-03-05 MED ORDER — SERTRALINE HCL 100 MG PO TABS: ORAL_TABLET | ORAL | Status: DC

## 2013-03-05 NOTE — Progress Notes (Signed)
[facility] [address] Progress Note  [name] [id number] 67 y.o.  03/05/2013 1:53 PM  Chief Complaint:  Medication management and followup.    History of Present Illness:  [name] came for his followup appointment.  Is compliant with his psychotropic medication.  He denies any irritability, anger or any mood swing.  He is sleeping better.  He is excited about lap band surgery next month.  He is seeing Dr. Alwyn Ren for his weight loss program.  Her surgery is scheduled in first week of January 2015 .  Patient feels ready for the surgery.  He denies any crying spells, anxiety attack, irritability or any suicidal thoughts.  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  He is not drinking alcohol or using any illegal substance.  He denies any tremors or shakes.  Suicidal Ideation: No Plan Formed: No Patient has means to carry out plan: No  Homicidal Ideation: No Plan Formed: No Patient has means to carry out plan: No  Review of Systems: Psychiatric: Agitation: No Hallucination: No Depressed Mood: No Insomnia: No Hypersomnia: No Altered Concentration: No Feels Worthless: No Grandiose Ideas: No Belief In Special Powers: No New/Increased Substance Abuse: No Compulsions: No  Neurologic: Headache: No Seizure: No Paresthesias: No  Past Medical History  Diagnosis Date  . Benign prostatic hypertrophy   . Hypertension   . Gout   . Nephrolithiasis   . Sleep apnea   . Depression   . Complex renal cyst   . FH: colonic polyps   . Asthma   . Morbid obesity   Patient see Dr. Alwyn Ren.  Psychosocial History: Patient lives with his wife.  His wife is very supportive.  Outpatient Encounter Prescriptions as of 03/05/2013  Medication Sig  . LORazepam (ATIVAN) 0.5 MG tablet TAKE 1 TAB IN THE MORNING AND 2 AT BEDTIME  . risperiDONE (RISPERDAL) 0.5 MG tablet Take 1 tablet (0.5 mg total) by mouth at bedtime.  . sertraline (ZOLOFT) 100 MG tablet Take 1 and 1/2 tab  daily  . [DISCONTINUED] LORazepam (ATIVAN) 0.5 MG tablet TAKE 1 TAB IN THE MORNING AND 2 AT BEDTIME  . [DISCONTINUED] risperiDONE (RISPERDAL) 0.5 MG tablet Take 1 tablet (0.5 mg total) by mouth at bedtime.  . [DISCONTINUED] sertraline (ZOLOFT) 100 MG tablet Take 1 and 1/2 tab daily  . allopurinol (ZYLOPRIM) 300 MG tablet Take 300 mg by mouth daily.    [name] amLODipine-benazepril (LOTREL) 5-10 MG per capsule TAKE ONE CAPSULE BY MOUTH EVERY DAY  . aspirin 325 MG tablet Take 325 mg by mouth 2 (two) times daily.    . finasteride (PROSCAR) 5 MG tablet Take 5 mg by mouth daily.    [name] gabapentin (NEURONTIN) 100 MG capsule One pill every eight hours as needed; dose may be increased by one pill each dose after 72 hours if only partially effective  . Glucosamine HCl (GLUCOSAMINE PO) Take by mouth daily.  . Multiple Vitamin (MULTIVITAMIN) tablet Take 1 tablet by mouth daily.    . pravastatin (PRAVACHOL) 40 MG tablet TAKE ONE TABLET BY MOUTH EVERY DAY    Recent Results (from the past 2160 hour(s))  CBC WITH DIFFERENTIAL     Status: None   Collection Time    12/05/12  2:06 PM      Result Value Range   WBC 8.0  4.0 - 10.5 K/uL   RBC 4.73  4.22 - 5.81 MIL/uL   Hemoglobin 14.3  13.0 - 17.0 g/dL   HCT 42.2  39.0 - 52.0 %   MCV 89.2  78.0 - 100.0 fL   MCH 30.2  26.0 - 34.0 pg   MCHC 33.9  30.0 - 36.0 g/dL   RDW 09.8  11.9 - 14.7 %   Platelets 207  150 - 400 K/uL   Neutrophils Relative % 72  43 - 77 %   Neutro Abs 5.8  1.7 - 7.7 K/uL   Lymphocytes Relative 17  12 - 46 %   Lymphs Abs 1.4  0.7 - 4.0 K/uL   Monocytes Relative 8  3 - 12 %   Monocytes Absolute 0.7  0.1 - 1.0 K/uL   Eosinophils Relative 2  0 - 5 %   Eosinophils Absolute 0.1  0.0 - 0.7 K/uL   Basophils Relative 1  0 - 1 %   Basophils Absolute 0.1  0.0 - 0.1 K/uL   Smear Review Criteria for review not met    COMPREHENSIVE METABOLIC PANEL     Status: Abnormal   Collection Time    12/05/12  2:06 PM      Result Value Range   Sodium 141  135  - 145 mEq/L   Potassium 4.2  3.5 - 5.3 mEq/L   Chloride 106  96 - 112 mEq/L   CO2 25  19 - 32 mEq/L   Glucose, Bld 116 (*) 70 - 99 mg/dL   BUN 22  6 - 23 mg/dL   Creat 8.29  5.62 - 1.30 mg/dL   Total Bilirubin 0.5  0.3 - 1.2 mg/dL   Alkaline Phosphatase 64  39 - 117 U/L   AST 17  0 - 37 U/L   ALT 14  0 - 53 U/L   Total Protein 6.2  6.0 - 8.3 g/dL   Albumin 3.6  3.5 - 5.2 g/dL   Calcium 8.1 (*) 8.4 - 10.5 mg/dL  TSH     Status: None   Collection Time    12/05/12  2:06 PM      Result Value Range   TSH 1.731  0.350 - 4.500 uIU/mL  T4     Status: None   Collection Time    12/05/12  2:06 PM      Result Value Range   T4, Total 6.5  5.0 - 12.5 ug/dL  H. PYLORI ANTIBODY, IGG     Status: Abnormal   Collection Time    12/05/12  2:06 PM      Result Value Range   H Pylori IgG 4.77 (*)    Comment: IgG antibody to H. pylori detected suggestive of previous exposure or     active infection.              ISR = Immune Status Ratio                  <0.90         ISR       Negative                  0.90 - 1.09   ISR       Equivocal                  >=1.10        ISR       Positive           The above results were obtained with the Immulite 2000 H. pylori IgG     EIA.  Results obtained from other manufacturer's  assay methods may not     be used interchangeably.        LIPID PANEL     Status: Abnormal   Collection Time    12/05/12  2:06 PM      Result Value Range   Cholesterol 138  0 - 200 mg/dL   Comment: ATP III Classification:           < 200        mg/dL        Desirable          200 - 239     mg/dL        Borderline High          >= 240        mg/dL        High         Triglycerides 137  <150 mg/dL   HDL 34 (*) >16 mg/dL   Total CHOL/HDL Ratio 4.1     VLDL 27  0 - 40 mg/dL   LDL Cholesterol 77  0 - 99 mg/dL   Comment:       Total Cholesterol/HDL Ratio:CHD Risk                            Coronary Heart Disease Risk Table                                            Men        Women              1/2 Average Risk              3.4        3.3                  Average Risk              5.0        4.4               2X Average Risk              9.6        7.1               3X Average Risk             23.4       11.0     Use the calculated Patient Ratio above and the CHD Risk table      to determine the patient's CHD Risk.     ATP III Classification (LDL):           < 100        mg/dL         Optimal          100 - 129     mg/dL         Near or Above Optimal          130 - 159     mg/dL         Borderline High          160 - 189     mg/dL         High           > 190  mg/dL         Very High        HEMOGLOBIN A1C     Status: Abnormal   Collection Time    12/05/12  2:06 PM      Result Value Range   Hemoglobin A1C 6.1 (*) <5.7 %   Comment:                                                                            According to the ADA Clinical Practice Recommendations for 2011, when     HbA1c is used as a screening test:             >=6.5%   Diagnostic of Diabetes Mellitus                (if abnormal result is confirmed)           5.7-6.4%   Increased risk of developing Diabetes Mellitus           References:Diagnosis and Classification of Diabetes Mellitus,Diabetes     Care,2011,34(Suppl 1):S62-S69 and Standards of Medical Care in             Diabetes - 2011,Diabetes Care,2011,34 (Suppl 1):S11-S61.         Mean Plasma Glucose 128 (*) <117 mg/dL    Past Psychiatric History/Hospitalization(s) Patient has one psychiatric admission in 2006 due to increased depression and having suicidal thoughts.  Since then patient has been seen in this office and feeling stable on his psychiatric medication. Anxiety: Yes Bipolar Disorder: No Depression: Yes Mania: No Psychosis: No Schizophrenia: No Personality Disorder: No Hospitalization for psychiatric illness: Yes History of Electroconvulsive Shock Therapy: No Prior Suicide Attempts: No  Physical  Exam: Constitutional:  BP 134/81  Pulse 53  Ht 6\' 2"  (1.88 m)  Wt 360 lb (163.295 kg)  BMI 46.20 kg/m2  Musculoskeletal: Strength & Muscle Tone: within normal limits Gait & Station: normal Patient leans: N/A  Mental Status Examination;  Patient is obese man who is casually dressed and fairly groomed.  He maintains good eye contact.  He is pleasant and cooperative.  His thought process is logical linear and goal directed.  There were no flight if ideas of any loose association.  He denies any active or passive suicidal thoughts and homicidal thoughts.  His fund of knowledge is adequate.  There were no paranoia or delusions present at this time.  His attention concentration is fair.  He is alert and oriented x3.  He described his mood as okay and his affect is mood appropriate.  His insight judgment and impulse control is okay.   Medical Decision Making (Choose Three): Established Problem, Stable/Improving (1), Review or order clinical lab tests (1), Review of Last Therapy Session (1) and Review of Medication Regimen & Side Effects (2)  Assessment: Axis I: Maj. depressive disorder, recurrent  Axis II: Deferred  Axis III:  Past Medical History  Diagnosis Date  . Benign prostatic hypertrophy   . Hypertension   . Gout   . Nephrolithiasis   . Sleep apnea   . Depression   . Complex renal cyst   . FH: colonic polyps   . Asthma   . Morbid obesity  Axis IV: Mild   Plan:  I will continue his current psychotropic medication.  He is taking Zoloft 150 mg daily, Risperdal 0.5 mg at bedtime and Ativan 0.5 mg 1 in the morning and 2 at bedtime.  Recommend to call us back if he has any questions or concerns otherwise I will see him again in 3 months.    Vansh Reckart T., MD 03/05/2013

## 2013-03-06 ENCOUNTER — Encounter: Payer: Medicare Other | Attending: General Surgery

## 2013-03-07 ENCOUNTER — Encounter: Payer: Self-pay | Admitting: Internal Medicine

## 2013-03-07 ENCOUNTER — Ambulatory Visit (INDEPENDENT_AMBULATORY_CARE_PROVIDER_SITE_OTHER): Payer: Medicare Other | Admitting: Internal Medicine

## 2013-03-07 DIAGNOSIS — R0609 Other forms of dyspnea: Secondary | ICD-10-CM | POA: Diagnosis not present

## 2013-03-07 DIAGNOSIS — E785 Hyperlipidemia, unspecified: Secondary | ICD-10-CM | POA: Diagnosis not present

## 2013-03-07 DIAGNOSIS — I10 Essential (primary) hypertension: Secondary | ICD-10-CM

## 2013-03-07 DIAGNOSIS — R0600 Dyspnea, unspecified: Secondary | ICD-10-CM

## 2013-03-07 NOTE — Progress Notes (Signed)
Pre visit review using our clinic review tool, if applicable. No additional management support is needed unless otherwise documented below in the visit note. 

## 2013-03-07 NOTE — Progress Notes (Signed)
Pre-Operative Nutrition Class:  Appt start time: 1730   End time:  1930  Patient was seen on 03/06/2013 for Pre-Operative Bariatric Surgery Education [facility].   Surgery date: 04/08/2013 Surgery type: Lap Band  The following the learning objectives were met by the patient during this course:  Identify Pre-Op Dietary Goals and will begin 2 weeks pre-operatively  Identify appropriate sources of fluids and proteins   State protein recommendations and appropriate sources pre and post-operatively  Identify Post-Operative Dietary Goals and will follow for 2 weeks post-operatively  Identify appropriate multivitamin and calcium sources  Describe the need for physical activity post-operatively and will follow MD recommendations  State when to call healthcare provider regarding medication questions or post-operative complications  Handouts given during class include:  Pre-Op Bariatric Surgery Diet Handout  Protein Shake Handout  Post-Op Bariatric Surgery Nutrition Handout  [facility] Program Information Flyer  Support Group Information Flyer  [facility] Bariatric Supplements Price List  Follow-Up Plan: Patient will follow-up at [facility] 2 weeks post operatively for diet advancement per MD.

## 2013-03-07 NOTE — Patient Instructions (Signed)
Please avoid any activity which induces exertional dyspnea such as mowing the inclined acreage

## 2013-03-07 NOTE — Progress Notes (Signed)
   Subjective:    Patient ID: [name], male    DOB: 1945-11-30, 67 y.o.   MRN: [id number]  HPI   He is here for the last medically supervised weight loss documentation as part of the bariatric surgery program. Vital signs &  BMI as recorded  There's been no change in medications except for the addition of flaxseed supplementation. Exercises and nutritional program are unchanged; but he did exert himself beyond hiss usual program mowing his yard which is on nan incline. Also he received dietary information @ meeting sponsored by the bariatric program . He expressed disappointment that he had not learned of this earlier as he feels hois weight loss would have been greater.    Review of Systems  2 weeks ago he mowed his lawn of 1/4 acre on an incline  and had to stop because of exertional dyspnea. There wasn't any associated chest pain, diaphoresis or palpitations.  Dr Lolly Mustache stated that this is "the perfect time and I am in the perfect frame of mine" to complete the bariatric surgery.    Objective:   Physical Exam  Gen.: Morbidly obese in appearance. Alert, appropriate and cooperative throughout exam.  Head: Normocephalic without obvious abnormalities  Eyes: No corneal or conjunctival inflammation noted. No icterus Nose: External nasal exam reveals no deformity or inflammation. Nasal mucosa are pink and moist. No lesions or exudates noted.   Mouth: Oral mucosa and oropharynx reveal no lesions or exudates. Teeth in good repair. Neck: No deformities, masses, or tenderness noted. Thyroid normal. Lungs: Normal respiratory effort; chest expands symmetrically. Lungs are clear to auscultation without rales, wheezes, or increased work of breathing. Heart: Normal rate and rhythm. Normal S1 and S2. No gallop, click, or rub. S4. Abdomen: Bowel sounds normal; abdomen soft and nontender. No masses, organomegaly or hernias noted. Massive abdomen                            Musculoskeletal/extremities: No clubbing, cyanosis or significant extremity  deformity noted. Range of motion normal .Tone & strength normal.1+  ankle edema Hand joints normal except R 5th DIP/nail. Fingernail  health good. Able to lie down & sit up w/o help.  Vascular: Carotid, radial artery, dorsalis pedis and  posterior tibial pulses are full and equal. No bruits present. Neurologic: Alert and oriented x3. Deep tendon reflexes symmetrical and 1/2+ @ knees.         Skin: Intact without suspicious lesions or rashes. Lymph: No cervical, axillary lymphadenopathy present. Psych: Mood and affect are normal. Normally interactive                                                                                        Assessment & Plan:   #1 no significant change in reference to his morbid obesity & co-morbidities #2 recent DOE; R/O anginal equivalent Plan: EKG Avoid CVE which causes DOE

## 2013-03-19 NOTE — Progress Notes (Signed)
Dr. Andrey Campanile - please enter preop orders in Epic for [facility] - he is coming to [facility] on Jan 2 for his preop appointment / labs.  Thanks.

## 2013-03-20 ENCOUNTER — Ambulatory Visit (INDEPENDENT_AMBULATORY_CARE_PROVIDER_SITE_OTHER): Payer: Medicare Other | Admitting: General Surgery

## 2013-03-26 ENCOUNTER — Encounter (HOSPITAL_COMMUNITY): Payer: Self-pay | Admitting: Pharmacy Technician

## 2013-04-01 ENCOUNTER — Other Ambulatory Visit: Payer: Self-pay | Admitting: Internal Medicine

## 2013-04-01 NOTE — Telephone Encounter (Signed)
Pravastatin refilled per protocol. JG//CMA 

## 2013-04-02 NOTE — Progress Notes (Signed)
04-02-13 Need MD order entry-PAT visit planned 04-04-13 1300.

## 2013-04-03 DIAGNOSIS — I319 Disease of pericardium, unspecified: Secondary | ICD-10-CM

## 2013-04-03 HISTORY — DX: Disease of pericardium, unspecified: I31.9

## 2013-04-04 ENCOUNTER — Ambulatory Visit (INDEPENDENT_AMBULATORY_CARE_PROVIDER_SITE_OTHER): Payer: Medicare Other | Admitting: General Surgery

## 2013-04-04 ENCOUNTER — Encounter (INDEPENDENT_AMBULATORY_CARE_PROVIDER_SITE_OTHER): Payer: Self-pay | Admitting: General Surgery

## 2013-04-04 ENCOUNTER — Encounter (HOSPITAL_COMMUNITY)
Admission: RE | Admit: 2013-04-04 | Discharge: 2013-04-04 | Disposition: A | Discharge status: Home / Self Care | Payer: Medicare Other | Source: Ambulatory Visit | Attending: General Surgery | Admitting: General Surgery

## 2013-04-04 ENCOUNTER — Encounter (HOSPITAL_COMMUNITY): Payer: Self-pay

## 2013-04-04 VITALS — BP 110/62 | HR 70 | Temp 98.3°F | Resp 18 | Ht 72.0 in | Wt 343.4 lb

## 2013-04-04 DIAGNOSIS — Z6841 Body Mass Index (BMI) 40.0 and over, adult: Secondary | ICD-10-CM

## 2013-04-04 DIAGNOSIS — Z01812 Encounter for preprocedural laboratory examination: Secondary | ICD-10-CM | POA: Diagnosis not present

## 2013-04-04 DIAGNOSIS — I10 Essential (primary) hypertension: Secondary | ICD-10-CM

## 2013-04-04 DIAGNOSIS — K2100 Gastro-esophageal reflux disease with esophagitis, without bleeding: Secondary | ICD-10-CM

## 2013-04-04 DIAGNOSIS — G4733 Obstructive sleep apnea (adult) (pediatric): Secondary | ICD-10-CM

## 2013-04-04 DIAGNOSIS — Z01818 Encounter for other preprocedural examination: Secondary | ICD-10-CM | POA: Diagnosis not present

## 2013-04-04 HISTORY — DX: Adverse effect of unspecified anesthetic, initial encounter: T41.45XA

## 2013-04-04 HISTORY — DX: Personal history of urinary calculi: Z87.442

## 2013-04-04 HISTORY — DX: Anesthesia of skin: R20.0

## 2013-04-04 HISTORY — DX: Pain in left hip: M25.551

## 2013-04-04 HISTORY — DX: Other complications of anesthesia, initial encounter: T88.59XA

## 2013-04-04 HISTORY — DX: Pain in right hip: M25.552

## 2013-04-04 HISTORY — DX: Malignant (primary) neoplasm, unspecified: C80.1

## 2013-04-04 HISTORY — DX: Cyst of kidney, acquired: N28.1

## 2013-04-04 HISTORY — DX: Pain in right hip: M25.551

## 2013-04-04 LAB — CBC WITH DIFFERENTIAL/PLATELET
BASOS ABS: 0.1 10*3/uL (ref 0.0–0.1)
BASOS PCT: 1 % (ref 0–1)
EOS PCT: 1 % (ref 0–5)
Eosinophils Absolute: 0.1 10*3/uL (ref 0.0–0.7)
HCT: 42.5 % (ref 39.0–52.0)
Hemoglobin: 14.5 g/dL (ref 13.0–17.0)
LYMPHS PCT: 15 % (ref 12–46)
Lymphs Abs: 1.4 10*3/uL (ref 0.7–4.0)
MCH: 31.3 pg (ref 26.0–34.0)
MCHC: 34.1 g/dL (ref 30.0–36.0)
MCV: 91.8 fL (ref 78.0–100.0)
Monocytes Absolute: 0.8 10*3/uL (ref 0.1–1.0)
Monocytes Relative: 8 % (ref 3–12)
NEUTROS ABS: 7.1 10*3/uL (ref 1.7–7.7)
Neutrophils Relative %: 76 % (ref 43–77)
PLATELETS: 233 10*3/uL (ref 150–400)
RBC: 4.63 MIL/uL (ref 4.22–5.81)
RDW: 13.8 % (ref 11.5–15.5)
WBC: 9.3 10*3/uL (ref 4.0–10.5)

## 2013-04-04 LAB — COMPREHENSIVE METABOLIC PANEL
ALT: 14 U/L (ref 0–53)
AST: 18 U/L (ref 0–37)
Albumin: 3.6 g/dL (ref 3.5–5.2)
Alkaline Phosphatase: 66 U/L (ref 39–117)
BUN: 21 mg/dL (ref 6–23)
CO2: 24 meq/L (ref 19–32)
Calcium: 9 mg/dL (ref 8.4–10.5)
Chloride: 104 mEq/L (ref 96–112)
Creatinine, Ser: 1 mg/dL (ref 0.50–1.35)
GFR calc Af Amer: 88 mL/min — ABNORMAL LOW (ref >90)
GFR, EST NON AFRICAN AMERICAN: 76 mL/min — AB (ref >90)
Glucose, Bld: 103 mg/dL — ABNORMAL HIGH (ref 70–99)
Potassium: 4.2 mEq/L (ref 3.7–5.3)
SODIUM: 141 meq/L (ref 137–147)
Total Bilirubin: 0.5 mg/dL (ref 0.3–1.2)
Total Protein: 6.7 g/dL (ref 6.0–8.3)

## 2013-04-04 NOTE — Progress Notes (Addendum)
Patient ID: [name], male   DOB: [birth year], 68 y.o.   MRN: [id number]  Chief Complaint  Patient presents with  . Bariatric Pre-op    Lap Band     HPI [name] is a 68 y.o. male.   HPI 68 year old Caucasian male comes in today for his preoperative appointment. He is currently scheduled for a laparoscopic adjustable gastric band surgery with possible hiatal hernia repair on January 6. He denies any significant changes since he was seen during his initial visit appointment May 29. He denies any  Trips to the emergency room or hospital. He has started his preoperative diet. He states to the preop diet is going well. He has been exercising a little bit by doing a stationary bike. He does have some foot discomfort while riding  The stationary bike. He denies any gastroesophageal reflux problems.  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 Benign prostatic hypertrophy   . Hypertension   . Gout   . Depression   . FH: colonic polyps   . Morbid obesity   . Complication of anesthesia     MORPHINE CAUSES HALLUCINATIONS  . Asthma     childhood asthma  . Numbness     feet / legs  . Cancer   . History of kidney stones   . Kidney cysts   . Sleep apnea     USES C PAP  . Bilateral hip pain     Past Surgical History  Procedure Laterality Date  . Total knee replacment      bilat  . Cholecystectomy  1989  . Trigger finger repair      also lue, cts surgically repaired  . Renal calculi    . Tonsillectomy    . Lithotripsy    . Arthroscopy knee w/ drilling    . Breath tek h pylori N/A 12/23/2012    Procedure: BREATH TEK H PYLORI;  Surgeon: Gayland Curry, MD;  Location: [facility];  Service: General;  Laterality: N/A;  . Wisdom tooth extraction      Family History  Problem Relation Age of Onset  . Depression Mother   . Hypertension Mother   . Heart disease Father     MI  . Depression Brother   .  Stroke Paternal Aunt     CVA  . Diabetes Paternal Uncle   . Heart disease Paternal Uncle     MI  . Heart disease Paternal Grandmother     MI  . Diabetes Cousin     PATERNAL    Social History History  Substance Use Topics  . Smoking status: Former Smoker    Types: Cigarettes    Quit date: 08/30/1974  . Smokeless tobacco: Not on file  . Alcohol Use: Yes     Comment: Occasionally; 1 small drink a week    Allergies  Allergen Reactions  . Propoxyphene N-Acetaminophen Nausea Only  . Codeine     nausea  . Morphine     Nausea Severe hallucinations  . Penicillins     LOC with shot    Current Outpatient Prescriptions  Medication Sig Dispense Refill  . allopurinol (ZYLOPRIM) 300 MG tablet Take 300 mg by mouth daily with breakfast.       . amLODipine (NORVASC) 5 MG tablet Take 5 mg by mouth daily with breakfast.      . finasteride (PROSCAR) 5 MG tablet Take 5 mg  by mouth daily.       . Flaxseed, Linseed, (FLAX SEED OIL PO) Take 1 tablet by mouth daily.      [name] gabapentin (NEURONTIN) 100 MG capsule Take 100 mg by mouth 2 (two) times daily.      . Glucosamine HCl (GLUCOSAMINE PO) Take 1 tablet by mouth daily.       [name] LORazepam (ATIVAN) 0.5 MG tablet Take 0.5-1 mg by mouth 2 (two) times daily.      . Multiple Vitamin (MULTIVITAMIN) tablet Take 1 tablet by mouth daily.       . pravastatin (PRAVACHOL) 40 MG tablet Take 40 mg by mouth at bedtime.      . pravastatin (PRAVACHOL) 40 MG tablet TAKE ONE TABLET BY MOUTH EVERY DAY  90 tablet  1  . risperiDONE (RISPERDAL) 0.5 MG tablet Take 0.5 mg by mouth at bedtime.      . sertraline (ZOLOFT) 100 MG tablet Take 150 mg by mouth daily with breakfast.      . aspirin 325 MG tablet Take 325 mg by mouth daily.        No current facility-administered medications for this visit.    Review of Systems Review of Systems  Constitutional: Negative for fever, chills, appetite change and unexpected weight change.  HENT: Negative for congestion and  trouble swallowing.   Eyes: Negative for visual disturbance.  Respiratory: Negative for chest tightness and shortness of breath.        +OSA on CPAP  Cardiovascular: Negative for chest pain and leg swelling.       No PND, no orthopnea, no DOE  Gastrointestinal:       Denies GERD symptoms  Genitourinary: Negative for dysuria and hematuria.  Musculoskeletal: Negative.        B/l knee replacements  Skin: Negative for rash.  Neurological: Negative for seizures and speech difficulty.       No TIA or amaurosis fugax  Hematological: Does not bruise/bleed easily.  Psychiatric/Behavioral: Negative for behavioral problems and confusion.    Blood pressure 110/62, pulse 70, temperature 98.3 F (36.8 C), temperature source Temporal, resp. rate 18, height 6' (1.829 m), weight 343 lb 6.4 oz (155.765 kg).  Physical Exam Physical Exam  Vitals reviewed. Constitutional: He is oriented to person, place, and time. He appears well-developed and well-nourished. No distress.  HENT:  Head: Normocephalic and atraumatic.  Right Ear: External ear normal.  Left Ear: External ear normal.  Eyes: Conjunctivae are normal. No scleral icterus.  Neck: Normal range of motion. Neck supple. No tracheal deviation present. No thyromegaly present.  Cardiovascular: Normal rate, normal heart sounds and intact distal pulses.   Pulmonary/Chest: Effort normal and breath sounds normal. No respiratory distress. He has no wheezes.  Abdominal: Soft. He exhibits no distension. There is no tenderness. There is no rebound.    Musculoskeletal: Normal range of motion. He exhibits no edema and no tenderness.  Lymphadenopathy:    He has no cervical adenopathy.  Neurological: He is alert and oriented to person, place, and time. He exhibits normal muscle tone.  Skin: Skin is warm and dry. No rash noted. He is not diaphoretic. No erythema. No pallor.  Psychiatric: He has a normal mood and affect. His behavior is normal. Judgment and  thought content normal.    Data Reviewed My office note H pylori breath tek positive Evaluation labs ok except for A1C 6.1; HDL 34 UGI - GERD and small hiatal hernia cxr -ok  Assessment  Morbid obesity BMI 46.57 OSA on CPAP Gout HTN HPL Metabolic syndrome H Pylori positive  Hiatal hernia on UGI Pre-diabetic    Plan    We reviewed his preoperative workup. We discussed the findings of his upper GI. He denies any symptoms consistent with GERD. However I explained that we would test him for a hiatal hernia during surgery and if found to have a significant hiatal hernia that I would recommend going ahead and repairing the defect with several interrupted sutures around the hiatus. I explained that it would add 10-15 minutes of operative time. He was found to be H. Pylori positive on his breath test. I explained that we would treat this after surgery with a Prevpac. We'll probably have to give him a different antibiotic due to his penicillin allergy. We reviewed the steps during surgery as well as the immediate postoperative course. He will stay overnight for observation. He was advised to bring his CPAP mask with him to the hospital. I congratulated him on his weight loss to date with his preoperative diet. We rediscussed the importance of it. All his questions were asked and answered. I did explain that we will avoid morphine since he does have a history of a bad reaction to it. He gets hallucinations with morphine. I explained that there may be some cross-reactivity and that I could not absolutely guarantee the possibility of postoperative hallucinations or delirium. He states that he has taken Vicodin in the past without any problems. He was given access to the [facility] programs on laparoscopic adjustable gastric band surgery & the immediate postoperative care  Leighton Ruff. Redmond Pulling, MD, FACS General, Bariatric, & Minimally Invasive Surgery [facility], [place]        [facility]  M 04/04/2013, 2:05 PM

## 2013-04-04 NOTE — Patient Instructions (Addendum)
Alijah A Ionescu  04/04/2013                           YOUR PROCEDURE IS SCHEDULED ON: 04/08/13               PLEASE REPORT TO SHORT STAY CENTER AT : 5:30 AM               CALL THIS NUMBER IF ANY PROBLEMS THE DAY OF SURGERY :               [phone]                      REMEMBER:   Do not eat food or drink liquids AFTER MIDNIGHT   Take these medicines the morning of surgery with A SIP OF WATER: ALLOPURINOL / AMLODIPINE / GABAPENTIN / LORAZEPAM / SERTRALINE    Do not wear jewelry, make-up   Do not wear lotions, powders, or perfumes.   Do not shave legs or underarms 12 hrs. before surgery (men may shave face)  Do not bring valuables to the hospital.  Contacts, dentures or bridgework may not be worn into surgery.  Leave suitcase in the car. After surgery it may be brought to your room.  For patients admitted to the hospital more than one night, checkout time is 11:00                          The day of discharge.   Patients discharged the day of surgery will not be allowed to drive home                             If going home same day of surgery, must have someone stay with you first                           24 hrs at home and arrange for some one to drive you home from hospital.    Special Instructions:   Please read over the following fact sheets that you were given:               1. BRING C PAP [place]                      2. [place]               3. STOP ALL ASPIRIN / HERBAL MEDS / IBUPROFEN / ALEVE 7 DAYS PREOP                                                X_____________________________________________________________________        Failure to follow these instructions may result in cancellation of your surgery

## 2013-04-04 NOTE — Patient Instructions (Signed)
Keep up with the preop diet  

## 2013-04-08 ENCOUNTER — Ambulatory Visit (HOSPITAL_COMMUNITY): Payer: Medicare Other

## 2013-04-08 ENCOUNTER — Inpatient Hospital Stay (HOSPITAL_COMMUNITY)
Admission: RE | Admit: 2013-04-08 | Discharge: 2013-04-09 | DRG: 621 | Disposition: A | Discharge status: Home / Self Care | Payer: Medicare Other | Source: Ambulatory Visit | Attending: General Surgery | Admitting: General Surgery

## 2013-04-08 ENCOUNTER — Telehealth (INDEPENDENT_AMBULATORY_CARE_PROVIDER_SITE_OTHER): Payer: Self-pay

## 2013-04-08 ENCOUNTER — Encounter (HOSPITAL_COMMUNITY)
Admission: RE | Disposition: A | Discharge status: Home / Self Care | Payer: Self-pay | Source: Ambulatory Visit | Attending: General Surgery

## 2013-04-08 ENCOUNTER — Encounter (HOSPITAL_COMMUNITY): Payer: Self-pay | Admitting: *Deleted

## 2013-04-08 ENCOUNTER — Encounter (HOSPITAL_COMMUNITY): Payer: Medicare Other | Admitting: Anesthesiology

## 2013-04-08 ENCOUNTER — Inpatient Hospital Stay (HOSPITAL_COMMUNITY): Payer: Medicare Other | Admitting: Anesthesiology

## 2013-04-08 DIAGNOSIS — G4733 Obstructive sleep apnea (adult) (pediatric): Secondary | ICD-10-CM | POA: Diagnosis present

## 2013-04-08 DIAGNOSIS — Z79899 Other long term (current) drug therapy: Secondary | ICD-10-CM

## 2013-04-08 DIAGNOSIS — R935 Abnormal findings on diagnostic imaging of other abdominal regions, including retroperitoneum: Secondary | ICD-10-CM | POA: Diagnosis not present

## 2013-04-08 DIAGNOSIS — E785 Hyperlipidemia, unspecified: Secondary | ICD-10-CM | POA: Diagnosis present

## 2013-04-08 DIAGNOSIS — Z87891 Personal history of nicotine dependence: Secondary | ICD-10-CM | POA: Diagnosis not present

## 2013-04-08 DIAGNOSIS — E8881 Metabolic syndrome: Secondary | ICD-10-CM | POA: Diagnosis present

## 2013-04-08 DIAGNOSIS — K432 Incisional hernia without obstruction or gangrene: Secondary | ICD-10-CM | POA: Diagnosis present

## 2013-04-08 DIAGNOSIS — Z4651 Encounter for fitting and adjustment of gastric lap band: Secondary | ICD-10-CM | POA: Diagnosis not present

## 2013-04-08 DIAGNOSIS — F3289 Other specified depressive episodes: Secondary | ICD-10-CM | POA: Diagnosis present

## 2013-04-08 DIAGNOSIS — Z6841 Body Mass Index (BMI) 40.0 and over, adult: Secondary | ICD-10-CM

## 2013-04-08 DIAGNOSIS — Z823 Family history of stroke: Secondary | ICD-10-CM

## 2013-04-08 DIAGNOSIS — F329 Major depressive disorder, single episode, unspecified: Secondary | ICD-10-CM | POA: Diagnosis present

## 2013-04-08 DIAGNOSIS — Z880 Allergy status to penicillin: Secondary | ICD-10-CM

## 2013-04-08 DIAGNOSIS — K660 Peritoneal adhesions (postprocedural) (postinfection): Secondary | ICD-10-CM | POA: Diagnosis present

## 2013-04-08 DIAGNOSIS — Z7982 Long term (current) use of aspirin: Secondary | ICD-10-CM

## 2013-04-08 DIAGNOSIS — K449 Diaphragmatic hernia without obstruction or gangrene: Secondary | ICD-10-CM | POA: Diagnosis present

## 2013-04-08 DIAGNOSIS — Z833 Family history of diabetes mellitus: Secondary | ICD-10-CM | POA: Diagnosis not present

## 2013-04-08 DIAGNOSIS — K219 Gastro-esophageal reflux disease without esophagitis: Secondary | ICD-10-CM | POA: Diagnosis present

## 2013-04-08 DIAGNOSIS — I10 Essential (primary) hypertension: Secondary | ICD-10-CM | POA: Diagnosis present

## 2013-04-08 DIAGNOSIS — A048 Other specified bacterial intestinal infections: Secondary | ICD-10-CM | POA: Diagnosis present

## 2013-04-08 DIAGNOSIS — Z8249 Family history of ischemic heart disease and other diseases of the circulatory system: Secondary | ICD-10-CM

## 2013-04-08 DIAGNOSIS — F32A Depression, unspecified: Secondary | ICD-10-CM | POA: Diagnosis present

## 2013-04-08 DIAGNOSIS — N400 Enlarged prostate without lower urinary tract symptoms: Secondary | ICD-10-CM | POA: Diagnosis not present

## 2013-04-08 DIAGNOSIS — Z96659 Presence of unspecified artificial knee joint: Secondary | ICD-10-CM | POA: Diagnosis not present

## 2013-04-08 DIAGNOSIS — M109 Gout, unspecified: Secondary | ICD-10-CM | POA: Diagnosis present

## 2013-04-08 HISTORY — PX: LAPAROSCOPIC GASTRIC BANDING WITH HIATAL HERNIA REPAIR: SHX6351

## 2013-04-08 SURGERY — GASTRIC BANDING, LAPAROSCOPIC, WITH HIATAL HERNIA REPAIR
Anesthesia: General | Site: Abdomen

## 2013-04-08 MED ORDER — HEPARIN SODIUM (PORCINE) 5000 UNIT/ML IJ SOLN
5000.0000 [IU] | INTRAMUSCULAR | Status: AC
  Administered 2013-04-08: 5000 [IU] via SUBCUTANEOUS
  Filled 2013-04-08: qty 1

## 2013-04-08 MED ORDER — ONDANSETRON HCL 4 MG/2ML IJ SOLN: 4.0000 mg | INTRAMUSCULAR | Status: DC | PRN

## 2013-04-08 MED ORDER — ALLOPURINOL 300 MG PO TABS
300.0000 mg | ORAL_TABLET | Freq: Every day | ORAL | Status: DC
  Administered 2013-04-09: 300 mg via ORAL
  Filled 2013-04-08: qty 1

## 2013-04-08 MED ORDER — SUCCINYLCHOLINE CHLORIDE 20 MG/ML IJ SOLN
INTRAMUSCULAR | Status: DC | PRN
  Administered 2013-04-08: 160 mg via INTRAVENOUS

## 2013-04-08 MED ORDER — FENTANYL CITRATE 0.05 MG/ML IJ SOLN
INTRAMUSCULAR | Status: AC
  Filled 2013-04-08: qty 5

## 2013-04-08 MED ORDER — OXYCODONE HCL 5 MG/5ML PO SOLN
5.0000 mg | ORAL | Status: DC | PRN
  Administered 2013-04-08: 10 mg via ORAL
  Filled 2013-04-08: qty 10

## 2013-04-08 MED ORDER — MIDAZOLAM HCL 2 MG/2ML IJ SOLN
INTRAMUSCULAR | Status: AC
  Filled 2013-04-08: qty 2

## 2013-04-08 MED ORDER — GABAPENTIN 100 MG PO CAPS
100.0000 mg | ORAL_CAPSULE | Freq: Two times a day (BID) | ORAL | Status: DC
  Administered 2013-04-08 – 2013-04-09 (×2): 100 mg via ORAL
  Filled 2013-04-08 (×3): qty 1

## 2013-04-08 MED ORDER — FENTANYL CITRATE 0.05 MG/ML IJ SOLN
INTRAMUSCULAR | Status: DC | PRN
  Administered 2013-04-08: 50 ug via INTRAVENOUS
  Administered 2013-04-08: 100 ug via INTRAVENOUS
  Administered 2013-04-08: 50 ug via INTRAVENOUS

## 2013-04-08 MED ORDER — UNJURY VANILLA POWDER: 2.0000 [oz_av] | Freq: Four times a day (QID) | ORAL | Status: DC

## 2013-04-08 MED ORDER — MEPERIDINE HCL 50 MG/ML IJ SOLN: 6.2500 mg | INTRAMUSCULAR | Status: DC | PRN

## 2013-04-08 MED ORDER — AMLODIPINE BESYLATE 5 MG PO TABS
5.0000 mg | ORAL_TABLET | Freq: Every day | ORAL | Status: DC
  Administered 2013-04-09: 5 mg via ORAL
  Filled 2013-04-08: qty 1

## 2013-04-08 MED ORDER — 0.9 % SODIUM CHLORIDE (POUR BTL) OPTIME
TOPICAL | Status: DC | PRN
  Administered 2013-04-08: 1000 mL

## 2013-04-08 MED ORDER — NEOSTIGMINE METHYLSULFATE 1 MG/ML IJ SOLN
INTRAMUSCULAR | Status: DC | PRN
  Administered 2013-04-08: 4 mg via INTRAVENOUS

## 2013-04-08 MED ORDER — SODIUM CHLORIDE 0.9 % IJ SOLN
INTRAMUSCULAR | Status: AC
  Filled 2013-04-08: qty 10

## 2013-04-08 MED ORDER — BUPIVACAINE-EPINEPHRINE PF 0.25-1:200000 % IJ SOLN
INTRAMUSCULAR | Status: AC
  Filled 2013-04-08: qty 30

## 2013-04-08 MED ORDER — LACTATED RINGERS IV SOLN: INTRAVENOUS | Status: DC

## 2013-04-08 MED ORDER — LIDOCAINE HCL (CARDIAC) 20 MG/ML IV SOLN
INTRAVENOUS | Status: AC
Start: 2013-04-08 — End: 2013-04-08
  Filled 2013-04-08: qty 5

## 2013-04-08 MED ORDER — ONDANSETRON HCL 4 MG/2ML IJ SOLN
INTRAMUSCULAR | Status: DC | PRN
  Administered 2013-04-08: 4 mg via INTRAVENOUS

## 2013-04-08 MED ORDER — FINASTERIDE 5 MG PO TABS
5.0000 mg | ORAL_TABLET | Freq: Every day | ORAL | Status: DC
  Administered 2013-04-08 – 2013-04-09 (×2): 5 mg via ORAL
  Filled 2013-04-08 (×2): qty 1

## 2013-04-08 MED ORDER — ACETAMINOPHEN 160 MG/5ML PO SOLN: 650.0000 mg | ORAL | Status: DC | PRN

## 2013-04-08 MED ORDER — DEXAMETHASONE SODIUM PHOSPHATE 10 MG/ML IJ SOLN
INTRAMUSCULAR | Status: DC | PRN
  Administered 2013-04-08: 10 mg via INTRAVENOUS

## 2013-04-08 MED ORDER — FENTANYL CITRATE 0.05 MG/ML IJ SOLN
25.0000 ug | INTRAMUSCULAR | Status: DC | PRN
  Administered 2013-04-08: 50 ug via INTRAVENOUS

## 2013-04-08 MED ORDER — DEXAMETHASONE SODIUM PHOSPHATE 10 MG/ML IJ SOLN
INTRAMUSCULAR | Status: AC
  Filled 2013-04-08: qty 1

## 2013-04-08 MED ORDER — LACTATED RINGERS IV SOLN
INTRAVENOUS | Status: DC | PRN
  Administered 2013-04-08 (×2): via INTRAVENOUS

## 2013-04-08 MED ORDER — LORAZEPAM 0.5 MG PO TABS
0.5000 mg | ORAL_TABLET | Freq: Two times a day (BID) | ORAL | Status: DC | PRN
  Administered 2013-04-08: 0.5 mg via ORAL
  Filled 2013-04-08: qty 1

## 2013-04-08 MED ORDER — RISPERIDONE 0.5 MG PO TABS
0.5000 mg | ORAL_TABLET | Freq: Every day | ORAL | Status: DC
  Administered 2013-04-08: 0.5 mg via ORAL
  Filled 2013-04-08 (×2): qty 1

## 2013-04-08 MED ORDER — SERTRALINE HCL 50 MG PO TABS
150.0000 mg | ORAL_TABLET | Freq: Every day | ORAL | Status: DC
  Administered 2013-04-09: 150 mg via ORAL
  Filled 2013-04-08: qty 1

## 2013-04-08 MED ORDER — PROPOFOL 10 MG/ML IV BOLUS
INTRAVENOUS | Status: AC
  Filled 2013-04-08: qty 20

## 2013-04-08 MED ORDER — PNEUMOCOCCAL VAC POLYVALENT 25 MCG/0.5ML IJ INJ
0.5000 mL | INJECTION | INTRAMUSCULAR | Status: AC
  Administered 2013-04-09: 0.5 mL via INTRAMUSCULAR
  Filled 2013-04-08 (×2): qty 0.5

## 2013-04-08 MED ORDER — ATROPINE SULFATE 0.4 MG/ML IJ SOLN
INTRAMUSCULAR | Status: AC
  Filled 2013-04-08: qty 1

## 2013-04-08 MED ORDER — SODIUM CHLORIDE 0.9 % IJ SOLN
INTRAMUSCULAR | Status: DC | PRN
  Administered 2013-04-08: 15 mL

## 2013-04-08 MED ORDER — GLYCOPYRROLATE 0.2 MG/ML IJ SOLN
INTRAMUSCULAR | Status: DC | PRN
  Administered 2013-04-08: 0.6 mg via INTRAVENOUS

## 2013-04-08 MED ORDER — EPHEDRINE SULFATE 50 MG/ML IJ SOLN
INTRAMUSCULAR | Status: DC | PRN
  Administered 2013-04-08 (×3): 10 mg via INTRAVENOUS

## 2013-04-08 MED ORDER — EPHEDRINE SULFATE 50 MG/ML IJ SOLN
INTRAMUSCULAR | Status: AC
Start: 2013-04-08 — End: 2013-04-08
  Filled 2013-04-08: qty 1

## 2013-04-08 MED ORDER — FENTANYL CITRATE 0.05 MG/ML IJ SOLN
25.0000 ug | INTRAMUSCULAR | Status: DC | PRN
  Administered 2013-04-08: 50 ug via INTRAVENOUS
  Filled 2013-04-08: qty 2

## 2013-04-08 MED ORDER — UNJURY CHOCOLATE CLASSIC POWDER: 2.0000 [oz_av] | Freq: Four times a day (QID) | ORAL | Status: DC

## 2013-04-08 MED ORDER — PROPOFOL 10 MG/ML IV BOLUS
INTRAVENOUS | Status: DC | PRN
  Administered 2013-04-08: 300 mg via INTRAVENOUS

## 2013-04-08 MED ORDER — FENTANYL CITRATE 0.05 MG/ML IJ SOLN
INTRAMUSCULAR | Status: AC
  Filled 2013-04-08: qty 2

## 2013-04-08 MED ORDER — ROCURONIUM BROMIDE 100 MG/10ML IV SOLN
INTRAVENOUS | Status: AC
Start: 2013-04-08 — End: 2013-04-08
  Filled 2013-04-08: qty 1

## 2013-04-08 MED ORDER — PROMETHAZINE HCL 25 MG/ML IJ SOLN: 6.2500 mg | INTRAMUSCULAR | Status: DC | PRN

## 2013-04-08 MED ORDER — KCL IN DEXTROSE-NACL 20-5-0.45 MEQ/L-%-% IV SOLN
INTRAVENOUS | Status: DC
  Administered 2013-04-08 – 2013-04-09 (×2): via INTRAVENOUS
  Filled 2013-04-08 (×4): qty 1000

## 2013-04-08 MED ORDER — BUPIVACAINE-EPINEPHRINE 0.25% -1:200000 IJ SOLN
INTRAMUSCULAR | Status: DC | PRN
  Administered 2013-04-08: 40 mL

## 2013-04-08 MED ORDER — LIDOCAINE HCL (PF) 2 % IJ SOLN
INTRAMUSCULAR | Status: DC | PRN
  Administered 2013-04-08: 100 mg via INTRADERMAL

## 2013-04-08 MED ORDER — LACTATED RINGERS IV SOLN
INTRAVENOUS | Status: DC | PRN
  Administered 2013-04-08: 1

## 2013-04-08 MED ORDER — CHLORHEXIDINE GLUCONATE 0.12 % MT SOLN
15.0000 mL | Freq: Two times a day (BID) | OROMUCOSAL | Status: DC
  Filled 2013-04-08 (×4): qty 15

## 2013-04-08 MED ORDER — INFLUENZA VAC SPLIT QUAD 0.5 ML IM SUSP
0.5000 mL | INTRAMUSCULAR | Status: AC
  Administered 2013-04-09: 0.5 mL via INTRAMUSCULAR
  Filled 2013-04-08 (×2): qty 0.5

## 2013-04-08 MED ORDER — MIDAZOLAM HCL 5 MG/5ML IJ SOLN
INTRAMUSCULAR | Status: DC | PRN
  Administered 2013-04-08: 2 mg via INTRAVENOUS

## 2013-04-08 MED ORDER — BIOTENE DRY MOUTH MT LIQD
15.0000 mL | Freq: Two times a day (BID) | OROMUCOSAL | Status: DC
  Administered 2013-04-08: 15 mL via OROMUCOSAL

## 2013-04-08 MED ORDER — NEOSTIGMINE METHYLSULFATE 1 MG/ML IJ SOLN
INTRAMUSCULAR | Status: AC
  Filled 2013-04-08: qty 10

## 2013-04-08 MED ORDER — ONDANSETRON HCL 4 MG/2ML IJ SOLN
INTRAMUSCULAR | Status: AC
  Filled 2013-04-08: qty 2

## 2013-04-08 MED ORDER — ROCURONIUM BROMIDE 100 MG/10ML IV SOLN
INTRAVENOUS | Status: DC | PRN
  Administered 2013-04-08: 20 mg via INTRAVENOUS
  Administered 2013-04-08: 48 mg via INTRAVENOUS
  Administered 2013-04-08: 2 mg via INTRAVENOUS
  Administered 2013-04-08: 10 mg via INTRAVENOUS

## 2013-04-08 MED ORDER — UNJURY CHICKEN SOUP POWDER: 2.0000 [oz_av] | Freq: Four times a day (QID) | ORAL | Status: DC

## 2013-04-08 MED ORDER — GLYCOPYRROLATE 0.2 MG/ML IJ SOLN
INTRAMUSCULAR | Status: AC
  Filled 2013-04-08: qty 3

## 2013-04-08 MED ORDER — LEVOFLOXACIN IN D5W 750 MG/150ML IV SOLN
750.0000 mg | INTRAVENOUS | Status: AC
  Administered 2013-04-08: 750 mg via INTRAVENOUS
  Filled 2013-04-08: qty 150

## 2013-04-08 MED ORDER — ENOXAPARIN SODIUM 40 MG/0.4ML ~~LOC~~ SOLN
40.0000 mg | Freq: Two times a day (BID) | SUBCUTANEOUS | Status: DC
  Administered 2013-04-09: 40 mg via SUBCUTANEOUS
  Filled 2013-04-08 (×3): qty 0.4

## 2013-04-08 SURGICAL SUPPLY — 58 items
BAND LAP VG SYSTEM W/TUBES (Band) ×1 IMPLANT
BENZOIN TINCTURE PRP APPL 2/3 (GAUZE/BANDAGES/DRESSINGS) IMPLANT
BLADE HEX COATED 2.75 (ELECTRODE) ×2 IMPLANT
BLADE SURG 15 STRL LF DISP TIS (BLADE) ×1 IMPLANT
BLADE SURG 15 STRL SS (BLADE) ×1
BLADE SURG SZ11 CARB STEEL (BLADE) ×2 IMPLANT
CANISTER SUCTION 2500CC (MISCELLANEOUS) IMPLANT
CHLORAPREP W/TINT 26ML (MISCELLANEOUS) ×2 IMPLANT
DECANTER SPIKE VIAL GLASS SM (MISCELLANEOUS) ×2 IMPLANT
DERMABOND ADVANCED (GAUZE/BANDAGES/DRESSINGS)
DERMABOND ADVANCED .7 DNX12 (GAUZE/BANDAGES/DRESSINGS) IMPLANT
DEVICE SUT QUICK LOAD TK 5 (STAPLE) ×6 IMPLANT
DEVICE SUT TI-KNOT TK 5X26 (MISCELLANEOUS) ×2 IMPLANT
DEVICE SUTURE ENDOST 10MM (ENDOMECHANICALS) IMPLANT
DISSECTOR BLUNT TIP ENDO 5MM (MISCELLANEOUS) ×2 IMPLANT
DRAPE CAMERA CLOSED 9X96 (DRAPES) ×4 IMPLANT
DRAPE UTILITY XL STRL (DRAPES) ×4 IMPLANT
ELECT REM PT RETURN 9FT ADLT (ELECTROSURGICAL) ×2
ELECTRODE REM PT RTRN 9FT ADLT (ELECTROSURGICAL) ×1 IMPLANT
GLOVE BIO SURGEON STRL SZ7.5 (GLOVE) ×2 IMPLANT
GLOVE BIOGEL M STRL SZ7.5 (GLOVE) IMPLANT
GLOVE INDICATOR 8.0 STRL GRN (GLOVE) ×2 IMPLANT
GOWN STRL REUS W/TWL LRG LVL3 (GOWN DISPOSABLE) ×2 IMPLANT
GOWN STRL REUS W/TWL XL LVL3 (GOWN DISPOSABLE) ×4 IMPLANT
HOVERMATT SINGLE USE (MISCELLANEOUS) ×2 IMPLANT
KIT BASIN OR (CUSTOM PROCEDURE TRAY) ×2 IMPLANT
LAP BAND VG SYSTEM W/TUBES (Band) ×2 IMPLANT
MESH HERNIA 1X4 RECT BARD (Mesh General) ×1 IMPLANT
MESH HERNIA BARD 1X4 (Mesh General) ×1 IMPLANT
NEEDLE SPNL 22GX3.5 QUINCKE BK (NEEDLE) ×2 IMPLANT
NS IRRIG 1000ML POUR BTL (IV SOLUTION) ×2 IMPLANT
PACK UNIVERSAL I (CUSTOM PROCEDURE TRAY) ×2 IMPLANT
PENCIL BUTTON HOLSTER BLD 10FT (ELECTRODE) ×2 IMPLANT
SCALPEL HARMONIC ACE (MISCELLANEOUS) IMPLANT
SET IRRIG TUBING LAPAROSCOPIC (IRRIGATION / IRRIGATOR) IMPLANT
SLEEVE ADV FIXATION 5X100MM (TROCAR) ×2 IMPLANT
SOLUTION ANTI FOG 6CC (MISCELLANEOUS) ×2 IMPLANT
SPONGE LAP 18X18 X RAY DECT (DISPOSABLE) ×2 IMPLANT
STAPLER HERNIA 12 8.5 360D (INSTRUMENTS) IMPLANT
STAPLER VISISTAT 35W (STAPLE) IMPLANT
STRIP CLOSURE SKIN 1/2X4 (GAUZE/BANDAGES/DRESSINGS) IMPLANT
SUT ETHIBOND 2 0 SH (SUTURE) ×3
SUT ETHIBOND 2 0 SH 36X2 (SUTURE) ×3 IMPLANT
SUT MNCRL AB 4-0 PS2 18 (SUTURE) ×2 IMPLANT
SUT PROLENE 2 0 CT2 30 (SUTURE) ×2 IMPLANT
SUT SILK 0 (SUTURE) ×1
SUT SILK 0 30XBRD TIE 6 (SUTURE) ×1 IMPLANT
SUT SURGIDAC NAB ES-9 0 48 120 (SUTURE) IMPLANT
SUT VIC AB 2-0 SH 27 (SUTURE) ×1
SUT VIC AB 2-0 SH 27X BRD (SUTURE) ×1 IMPLANT
SYR 20CC LL (SYRINGE) ×2 IMPLANT
SYR CONTROL 10ML LL (SYRINGE) ×2 IMPLANT
TOWEL OR 17X26 10 PK STRL BLUE (TOWEL DISPOSABLE) ×2 IMPLANT
TROCAR ADV FIXATION 5X100MM (TROCAR) ×2 IMPLANT
TROCAR BLADELESS 15MM (ENDOMECHANICALS) ×2 IMPLANT
TROCAR BLADELESS OPT 5 100 (ENDOMECHANICALS) ×6 IMPLANT
TUBE CALIBRATION LAPBAND (TUBING) ×2 IMPLANT
TUBING INSUFFLATION 10FT LAP (TUBING) ×2 IMPLANT

## 2013-04-08 NOTE — Progress Notes (Signed)
Utilization review completed.  

## 2013-04-08 NOTE — Interval H&P Note (Signed)
History and Physical Interval Note:  04/08/2013 7:09 AM  [name]  has presented today for surgery, with the diagnosis of morbid obesity   The various methods of treatment have been discussed with the patient and family. After consideration of risks, benefits and other options for treatment, the patient has consented to  Procedure(s): LAPAROSCOPIC GASTRIC BANDING WITH possible  HIATAL HERNIA REPAIR (N/A) as a surgical intervention .  The patient's history has been reviewed, patient examined, no change in status, stable for surgery.  I have reviewed the patient's chart and labs.  Questions were answered to the patient's satisfaction.    Leighton Ruff. Redmond Pulling, MD, [place], Bariatric, & Minimally Invasive Surgery [facility], [place]   [facility] M

## 2013-04-08 NOTE — Progress Notes (Signed)
Spoke with Dr Redmond Pulling by phone, instructed to start patient on his protein shakes and ok to give po medications including Risperdal, Ativan, and Neurontin.

## 2013-04-08 NOTE — Op Note (Signed)
Laparoscopic Adjustable Gastric Band Placement and Hiatal Hernia Repair Operative Note  Pre-operative Diagnosis:  Morbid obesity BMI 45  OSA on CPAP  Gout  HTN  HPL  Metabolic syndrome  Hiatal hernia on UGI  Pre-diabetic  Post-operative Diagnosis: Morbid obesity BMI 45  OSA on CPAP  Gout  HTN  HPL  Metabolic syndrome  Hiatal hernia   Pre-diabetic RUQ subcostal incisional hernia  Surgeon: Gayland Curry   Assistants: Alphonsa Overall, MD FACS  Anesthesia: General endotracheal anesthesia  ASA Class: 3   Anesthesia: General plus local  Indications: Morbid Obesity unresponsive to medical treatment.  Findings: AP-L band, hiatal hernia defect   Procedure Details  The patient was seen in the Holding Room. The risks, benefits, complications, treatment options, and expected outcomes were discussed with the patient. The possibilities of reaction to medication, pulmonary aspiration, perforation of viscus, bleeding, recurrent infection, the need for additional procedures, failure to diagnose a condition, and creating a complication requiring transfusion or operation were discussed with the patient. The patient concurred with the proposed plan, giving informed consent.   The patient was taken to Operating Room # 6, identified as [name] and the procedure verified as Laparoscopic Adjustable Gastric Band Placement and possible hiatal hernia repair. A Time Out was held and the above information confirmed.  Full general anesthesia was induced with orotracheal intubation.  The patient was prepped and draped in a supine position. Appropriate antibiotics were given intravenously.  A 1cm incision was made 2 fingerbreadths below the left subcostal margin . Opitview technique was used to gain entry to the abdominal cavity. A 5 mm blunt trocar was advanced under direct vision through the abdominal wall with a 0 degree scope. The abdomen was insufflated, the laparoscope introduced.  There  were no untoward findings on diagnostic laparoscopy except for upper abdominal thiny filmy adhesions. A 5 mm trocar was placed slightly above and to the left of the umbilicus. Then using Endo Shears with and without electrocautery the adhesions were taken down.  Trocars were placed under direct vision in the following fashion: a 81m trocar in the lateral right upper quadrant, a 12mtrocar in the right upper quadrant, a 51m751mrocar in the high epigastrium, and one 51mm27mocar In the lateral left abdomen. The patient was placed in reverse trendelenburg position.  The [facility] retractor was then placed through the high epigastric trocar site and positioned to hold the liver.   The calibration tube was inserted into the patient's oropharynx and glided down into the stomach by the nurse anesthetist. 15 cc of air was used to inflate the balloon at the tip of the calibration tubing. It was pulled back gently and slid easily up into the esophagus past the GE junction. Since that slid easily I decided to dissect out the left and right crus and repair the defect. The gastrohepatic ligament was incised with electrocautery.  The right crus was identified. The surrounding fat was bluntly dissected away with the suction irrigate catheter. The left crus was identified as it joined the right crus. The esophagus was identified and lifted up out of the way. The defect was well visualized. The defect was reapproximated with 2 interrupted 0 Ethibond sutures using the Endo 360 suture device. The calibration tube was then readvanced into the stomach and inflated with 15 cc of air. It was then slid back toward the esophagus and met resistance at the GE junction. The angle of His was identified and the left crus was  dissected free.  Approximately 8cm below the angle of His on the lesser curvature, passing through the pars flaccida and preserving the vagus nerve, a blunt instrument was gently passed anterior to the right crus and  behind the gastro-esophageal junction without difficulty. Care was taken to minimize posterior dissection in order to prevent a posterior slip.   An AP-L Lap Band was introduced into the abdominal cavity through the 47m trocar and carried around the gastro-esophageal junction and locked onto itself. Three interrupted 2.0 Ethibond sutures (each secured with a titanium tie knot) were used to imbricate the anterior stomach to itself over the band to prevent anterior slippage.   The bowel was examined and there were no obvious lesions. Hemostasis was verified. The liver retractor was removed under direct visualization. There was no evidence of liver injury. The tubing from the band was brought out via the right upper quadrant 113mtrocar site. All trocars were then removed under direct vision. The skin incision was lengthened and a subcutaneous space was made to accommodate the port. A 1 inch square of vicryl mesh was anchored to the base of the port with 4 sutures. The port was attached to the tubing and then placed in the subcutaneous pocket.  The redundant tubing was advanced back into the abdominal cavity. Inverted interrupted deep dermal sutures using a 2-0 vicryl were placed.   The wounds were heavily irrigated. The skin incisions were closed with 4-0 monocryl. Dermabond was applied.   Instrument, sponge, and needle counts were correct prior to wound closure and at the conclusion of the case.          Complications:  None; patient tolerated the procedure well.                Condition: stable  ErLeighton RuffWiRedmond PullingMD, FACS General, Bariatric, & Minimally Invasive Surgery [facility], [place]

## 2013-04-08 NOTE — Progress Notes (Signed)
Spoke with patient in regards to CPAP HS-- placed on 16cmH20 per patients home settings. Filled humidification chamber at patients request, patient prefers to self administer when ready, has brought in home FFM/tubing. Verbalizes understanding to call if any additional assistance needed.

## 2013-04-08 NOTE — Anesthesia Preprocedure Evaluation (Addendum)
Anesthesia Evaluation  Patient identified by MRN, date of birth, ID band Patient awake    Reviewed: Allergy & Precautions, H&P , NPO status , Patient's Chart, lab work & pertinent test results  History of Anesthesia Complications Negative for: history of anesthetic complications  Airway Mallampati: III TM Distance: >3 FB Neck ROM: Full    Dental no notable dental hx. (+) Loose,    Pulmonary sleep apnea and Continuous Positive Airway Pressure Ventilation , former smoker,  breath sounds clear to auscultation  Pulmonary exam normal       Cardiovascular hypertension, Pt. on medications Rhythm:Regular Rate:Normal     Neuro/Psych negative neurological ROS  negative psych ROS   GI/Hepatic negative GI ROS, Neg liver ROS,   Endo/Other  Morbid obesity  Renal/GU negative Renal ROS  negative genitourinary   Musculoskeletal negative musculoskeletal ROS (+)   Abdominal   Peds negative pediatric ROS (+)  Hematology negative hematology ROS (+)   Anesthesia Other Findings   Reproductive/Obstetrics negative OB ROS                          Anesthesia Physical Anesthesia Plan  ASA: III  Anesthesia Plan: General   Post-op Pain Management:    Induction: Intravenous, Rapid sequence and Cricoid pressure planned  Airway Management Planned: Oral ETT and Video Laryngoscope Planned  Additional Equipment:   Intra-op Plan:   Post-operative Plan: Extubation in OR  Informed Consent: I have reviewed the patients History and Physical, chart, labs and discussed the procedure including the risks, benefits and alternatives for the proposed anesthesia with the patient or authorized representative who has indicated his/her understanding and acceptance.   Dental advisory given  Plan Discussed with: CRNA  Anesthesia Plan Comments:         Anesthesia Quick Evaluation

## 2013-04-08 NOTE — H&P (View-Only) (Signed)
Patient ID: [name], male   DOB: [birth year], 68 y.o.   MRN: [id number]  Chief Complaint  Patient presents with  . Bariatric Pre-op    Lap Band     HPI [name] is a 68 y.o. male.   HPI 68 year old Caucasian male comes in today for his preoperative appointment. He is currently scheduled for a laparoscopic adjustable gastric band surgery with possible hiatal hernia repair on January 6. He denies any significant changes since he was seen during his initial visit appointment May 29. He denies any  Trips to the emergency room or hospital. He has started his preoperative diet. He states to the preop diet is going well. He has been exercising a little bit by doing a stationary bike. He does have some foot discomfort while riding  The stationary bike. He denies any gastroesophageal reflux problems.  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 Benign prostatic hypertrophy   . Hypertension   . Gout   . Depression   . FH: colonic polyps   . Morbid obesity   . Complication of anesthesia     MORPHINE CAUSES HALLUCINATIONS  . Asthma     childhood asthma  . Numbness     feet / legs  . Cancer   . History of kidney stones   . Kidney cysts   . Sleep apnea     USES C PAP  . Bilateral hip pain     Past Surgical History  Procedure Laterality Date  . Total knee replacment      bilat  . Cholecystectomy  1989  . Trigger finger repair      also lue, cts surgically repaired  . Renal calculi    . Tonsillectomy    . Lithotripsy    . Arthroscopy knee w/ drilling    . Breath tek h pylori N/A 12/23/2012    Procedure: BREATH TEK H PYLORI;  Surgeon: Gayland Curry, MD;  Location: [facility];  Service: General;  Laterality: N/A;  . Wisdom tooth extraction      Family History  Problem Relation Age of Onset  . Depression Mother   . Hypertension Mother   . Heart disease Father     MI  . Depression Brother   .  Stroke Paternal Aunt     CVA  . Diabetes Paternal Uncle   . Heart disease Paternal Uncle     MI  . Heart disease Paternal Grandmother     MI  . Diabetes Cousin     PATERNAL    Social History History  Substance Use Topics  . Smoking status: Former Smoker    Types: Cigarettes    Quit date: 08/30/1974  . Smokeless tobacco: Not on file  . Alcohol Use: Yes     Comment: Occasionally; 1 small drink a week    Allergies  Allergen Reactions  . Propoxyphene N-Acetaminophen Nausea Only  . Codeine     nausea  . Morphine     Nausea Severe hallucinations  . Penicillins     LOC with shot    Current Outpatient Prescriptions  Medication Sig Dispense Refill  . allopurinol (ZYLOPRIM) 300 MG tablet Take 300 mg by mouth daily with breakfast.       . amLODipine (NORVASC) 5 MG tablet Take 5 mg by mouth daily with breakfast.      . finasteride (PROSCAR) 5 MG tablet Take 5 mg  by mouth daily.       . Flaxseed, Linseed, (FLAX SEED OIL PO) Take 1 tablet by mouth daily.      [name] gabapentin (NEURONTIN) 100 MG capsule Take 100 mg by mouth 2 (two) times daily.      . Glucosamine HCl (GLUCOSAMINE PO) Take 1 tablet by mouth daily.       [name] LORazepam (ATIVAN) 0.5 MG tablet Take 0.5-1 mg by mouth 2 (two) times daily.      . Multiple Vitamin (MULTIVITAMIN) tablet Take 1 tablet by mouth daily.       . pravastatin (PRAVACHOL) 40 MG tablet Take 40 mg by mouth at bedtime.      . pravastatin (PRAVACHOL) 40 MG tablet TAKE ONE TABLET BY MOUTH EVERY DAY  90 tablet  1  . risperiDONE (RISPERDAL) 0.5 MG tablet Take 0.5 mg by mouth at bedtime.      . sertraline (ZOLOFT) 100 MG tablet Take 150 mg by mouth daily with breakfast.      . aspirin 325 MG tablet Take 325 mg by mouth daily.        No current facility-administered medications for this visit.    Review of Systems Review of Systems  Constitutional: Negative for fever, chills, appetite change and unexpected weight change.  HENT: Negative for congestion and  trouble swallowing.   Eyes: Negative for visual disturbance.  Respiratory: Negative for chest tightness and shortness of breath.        +OSA on CPAP  Cardiovascular: Negative for chest pain and leg swelling.       No PND, no orthopnea, no DOE  Gastrointestinal:       Denies GERD symptoms  Genitourinary: Negative for dysuria and hematuria.  Musculoskeletal: Negative.        B/l knee replacements  Skin: Negative for rash.  Neurological: Negative for seizures and speech difficulty.       No TIA or amaurosis fugax  Hematological: Does not bruise/bleed easily.  Psychiatric/Behavioral: Negative for behavioral problems and confusion.    Blood pressure 110/62, pulse 70, temperature 98.3 F (36.8 C), temperature source Temporal, resp. rate 18, height 6' (1.829 m), weight 343 lb 6.4 oz (155.765 kg).  Physical Exam Physical Exam  Vitals reviewed. Constitutional: He is oriented to person, place, and time. He appears well-developed and well-nourished. No distress.  HENT:  Head: Normocephalic and atraumatic.  Right Ear: External ear normal.  Left Ear: External ear normal.  Eyes: Conjunctivae are normal. No scleral icterus.  Neck: Normal range of motion. Neck supple. No tracheal deviation present. No thyromegaly present.  Cardiovascular: Normal rate, normal heart sounds and intact distal pulses.   Pulmonary/Chest: Effort normal and breath sounds normal. No respiratory distress. He has no wheezes.  Abdominal: Soft. He exhibits no distension. There is no tenderness. There is no rebound.    Musculoskeletal: Normal range of motion. He exhibits no edema and no tenderness.  Lymphadenopathy:    He has no cervical adenopathy.  Neurological: He is alert and oriented to person, place, and time. He exhibits normal muscle tone.  Skin: Skin is warm and dry. No rash noted. He is not diaphoretic. No erythema. No pallor.  Psychiatric: He has a normal mood and affect. His behavior is normal. Judgment and  thought content normal.    Data Reviewed My office note H pylori breath tek positive Evaluation labs ok except for A1C 6.1; HDL 34 UGI - GERD and small hiatal hernia cxr -ok  Assessment  Morbid obesity BMI 46.57 OSA on CPAP Gout HTN HPL Metabolic syndrome H Pylori positive  Hiatal hernia on UGI Pre-diabetic    Plan    We reviewed his preoperative workup. We discussed the findings of his upper GI. He denies any symptoms consistent with GERD. However I explained that we would test him for a hiatal hernia during surgery and if found to have a significant hiatal hernia that I would recommend going ahead and repairing the defect with several interrupted sutures around the hiatus. I explained that it would add 10-15 minutes of operative time. He was found to be H. Pylori positive on his breath test. I explained that we would treat this after surgery with a Prevpac. We'll probably have to give him a different antibiotic due to his penicillin allergy. We reviewed the steps during surgery as well as the immediate postoperative course. He will stay overnight for observation. He was advised to bring his CPAP mask with him to the hospital. I congratulated him on his weight loss to date with his preoperative diet. We rediscussed the importance of it. All his questions were asked and answered. I did explain that we will avoid morphine since he does have a history of a bad reaction to it. He gets hallucinations with morphine. I explained that there may be some cross-reactivity and that I could not absolutely guarantee the possibility of postoperative hallucinations or delirium. He states that he has taken Vicodin in the past without any problems. He was given access to the [facility] programs on laparoscopic adjustable gastric band surgery & the immediate postoperative care  Leighton Ruff. Redmond Pulling, MD, FACS General, Bariatric, & Minimally Invasive Surgery [facility], [place]        [facility]  M 04/04/2013, 2:05 PM

## 2013-04-08 NOTE — Telephone Encounter (Signed)
[facility] case management is calling requesting an inpatient order for this patient.

## 2013-04-08 NOTE — Transfer of Care (Signed)
Immediate Anesthesia Transfer of Care Note  Patient: [name]  Procedure(s) Performed: Procedure(s): LAPAROSCOPIC GASTRIC BANDING WITH possible  HIATAL HERNIA REPAIR (N/A)  Patient Location: PACU  Anesthesia Type:General  Level of Consciousness: awake, alert , oriented and patient cooperative  Airway & Oxygen Therapy: Patient Spontanous Breathing and Patient connected to face mask oxygen  Post-op Assessment: Report given to PACU RN, Post -op Vital signs reviewed and stable and Patient moving all extremities X 4  Post vital signs: Reviewed and stable  Complications: No apparent anesthesia complications

## 2013-04-09 ENCOUNTER — Encounter (HOSPITAL_COMMUNITY): Payer: Self-pay | Admitting: General Surgery

## 2013-04-09 LAB — CBC WITH DIFFERENTIAL/PLATELET
Basophils Absolute: 0 10*3/uL (ref 0.0–0.1)
Basophils Relative: 0 % (ref 0–1)
EOS ABS: 0 10*3/uL (ref 0.0–0.7)
Eosinophils Relative: 0 % (ref 0–5)
HEMATOCRIT: 42 % (ref 39.0–52.0)
Hemoglobin: 13.8 g/dL (ref 13.0–17.0)
Lymphocytes Relative: 8 % — ABNORMAL LOW (ref 12–46)
Lymphs Abs: 0.9 10*3/uL (ref 0.7–4.0)
MCH: 30.7 pg (ref 26.0–34.0)
MCHC: 32.9 g/dL (ref 30.0–36.0)
MCV: 93.5 fL (ref 78.0–100.0)
MONOS PCT: 11 % (ref 3–12)
Monocytes Absolute: 1.2 10*3/uL — ABNORMAL HIGH (ref 0.1–1.0)
NEUTROS ABS: 8.9 10*3/uL — AB (ref 1.7–7.7)
Neutrophils Relative %: 81 % — ABNORMAL HIGH (ref 43–77)
Platelets: 221 10*3/uL (ref 150–400)
RBC: 4.49 MIL/uL (ref 4.22–5.81)
RDW: 13.9 % (ref 11.5–15.5)
WBC: 11 10*3/uL — ABNORMAL HIGH (ref 4.0–10.5)

## 2013-04-09 MED ORDER — OXYCODONE HCL 5 MG/5ML PO SOLN: 5.0000 mg | ORAL | Status: DC | PRN

## 2013-04-09 NOTE — Progress Notes (Signed)
1 Day Post-Op  Subjective: Pain ok. Tolerating liquids. No reflux/vomiting/regurg  Objective: Vital signs in last 24 hours: Temp:  [97.6 F (36.4 C)-98.8 F (37.1 C)] 97.9 F (36.6 C) (01/07 0544) Pulse Rate:  [55-73] 56 (01/07 0544) Resp:  [13-20] 18 (01/07 0544) BP: (91-146)/(44-74) 118/68 mmHg (01/07 0544) SpO2:  [92 %-100 %] 98 % (01/07 0544) Last BM Date: 04/05/13  Intake/Output from previous day: 01/06 0701 - 01/07 0700 In: 3153.3 [P.O.:180; I.V.:2973.3] Out: 950 [Urine:950] Intake/Output this shift:    Alert, nad cta Reg Soft obese, incisions c/d/i; mild TTP No edema; scds  Lab Results:   Recent Labs  04/09/13 0600  WBC 11.0*  HGB 13.8  HCT 42.0  PLT 221   BMET No results found for this basename: NA, K, CL, CO2, GLUCOSE, BUN, CREATININE, CALCIUM,  in the last 72 hours PT/INR No results found for this basename: LABPROT, INR,  in the last 72 hours ABG No results found for this basename: PHART, PCO2, PO2, HCO3,  in the last 72 hours  Studies/Results: Dg Abd 1 View  04/08/2013   CLINICAL DATA:  Status post lap band placement.  EXAM: ABDOMEN - 1 VIEW  COMPARISON:  CT abdomen and pelvis 11/28/2012.  FINDINGS: Lap band is in place. Tubing is intact. The band appears correctly oriented. Bowel gas pattern is unremarkable.  IMPRESSION: Status post placement of a lap band without evidence of complication.   Electronically Signed   By: Inge Rise M.D.   On: 04/08/2013 14:30    Anti-infectives: Anti-infectives   Start     Dose/Rate Route Frequency Ordered Stop   04/08/13 0515  levofloxacin (LEVAQUIN) IVPB 750 mg     750 mg 100 mL/hr over 90 Minutes Intravenous On call to O.R. 04/08/13 0515 04/08/13 0910      Assessment/Plan: s/p Procedure(s): LAPAROSCOPIC GASTRIC BANDING WITH possible  HIATAL HERNIA REPAIR (N/A)  D/c to home after d/c teaching D/w pt and son discharge instructions Explained that pt must remain of full liquid diet for 2 weeks  [name].  Redmond Pulling, MD, FACS General, Bariatric, & Minimally Invasive Surgery [facility], [place]   LOS: 1 day    Gayland Curry 04/09/2013

## 2013-04-09 NOTE — Discharge Instructions (Signed)
° °                ° °ADJUSTABLE GASTRIC BAND ° Home Care Instructions ° ° These instructions are to help you care for yourself when you go home. ° °Call: If you have any problems. °• Call [phone] and ask for the surgeon on call °• If you need immediate assistance come to the ER at [facility]. Tell the ER staff you are a new post-op gastric banding patient  °Signs and symptoms to report: • Severe  vomiting or nausea °o If you cannot handle clear liquids for longer than 1 day, call your surgeon °• Abdominal pain which does not get better after taking your pain medication °• Fever greater than 100.4°  F and chills °• Heart rate over 100 beats a minute °• Trouble breathing °• Chest pain °• Redness,  swelling, drainage, or foul odor at incision (surgical) sites °• If your incisions open or pull apart °• Swelling or pain in calf (lower leg) °• Diarrhea (Loose bowel movements that happen often), frequent watery, uncontrolled bowel movements °• Constipation, (no bowel movements for 3 days) if this happens: °o Take Milk of Magnesia, 2 tablespoons by mouth, 3 times a day for 2 days if needed °o Stop taking Milk of Magnesia once you have had a bowel movement °o Call your doctor if constipation continues °Or °o Take Miralax  (instead of Milk of Magnesia) following the label instructions °o Stop taking Miralax once you have had a bowel movement °o Call your doctor if constipation continues °• Anything you think is “abnormal for you” °  °Normal side effects after surgery: • Unable to sleep at night or unable to concentrate °• Irritability °• Being tearful (crying) or depressed ° °These are common complaints, possibly related to your anesthesia, stress of surgery, and change in lifestyle, that usually go away a few weeks after surgery. If these feelings continue, call your medical doctor.  °Wound Care: You may have surgical glue, steri-strips, or staples over your incisions after surgery °• Surgical glue: Looks like clear  film over your incisions and will wear off a little at a time °• Steri-strips: Adhesive strips of tape over your incisions. You may notice a yellowish color on skin under the steri-strips. This is used to make the steri-strips stick better. Do not pull the steri-strips off - let them fall off °• Staples: Staples may be removed before you leave the hospital °o If you go home with staples, call [facility] for an appointment with your surgeon’s nurse to have staples removed 10 days after surgery, [phone] °• Showering: You may shower two (2) days after your surgery unless your surgeon tells you differently °o Wash gently around incisions with warm soapy water, rinse well, and gently pat dry °o If you have a drain (tube from your incision), you may need someone to hold this while you shower °o No tub baths until staples are removed and incisions are healed °  °Medications: • Medications should be liquid or crushed if larger than the size of a dime °• Extended release pills (medication that releases a little bit at a time through the  day) should not be crushed °• Depending on the size and number of medications you take, you may need to space (take a few throughout the day)/change the time you take your medications so that you do not over-fill your pouch (smaller stomach) °• Make sure you follow-up with you primary care physician   to make medication changes needed during rapid weight loss and life -style changes °• If you have diabetes, follow up with your doctor that orders your diabetes medication(s) within one week after surgery and check your blood sugar regularly ° °• Do not drive while taking narcotics (pain medications) ° °• Do not take acetaminophen (Tylenol) and Roxicet or Lortab Elixir at the same time since these pain medications contain acetaminophen °  °Diet:  °First 2 Weeks You will see the nutritionist about two (2) weeks after your surgery. The nutritionist will increase the types of  foods you can eat if you are handling liquids well: °• If you have severe vomiting or nausea and cannot handle clear liquids lasting longer than 1 day call your surgeon °For Same Day Surgery Discharge Patients: °• The day of surgery drink water only: 2 ounces every 4 hours °• If you are handling water, start drinking your high protein shake the next morning °For Overnight Stay Patients: °• Begin by drinking 2 ounces of a high protein every 3 hours, 5-6 times per day °• Slowly increase the amount you drink as tolerated °• You may find it easier to slowly sip shakes throughout the day. It is important to get your proteins in first °  ° Protein Shake °• Drink at least 2 ounces of shake 5-6 times per day °• Each serving of protein shakes (usually 8-12 ounces) should have a minimum of: °o 15 grams of protein °o And no more than 5 grams of carbohydrate °• Goal for protein each day: °o Men = 80 grams per day °o Women = 60 grams per day °• Protein powder may be added to fluids such as non-fat milk or Lactaid milk or Soy milk (limit to 35 grams added protein powder per serving) ° °Hydration °• Slowly increase the amount of water and other clear liquids as tolerated (See Acceptable Fluids) °• Slowly increase the amount of protein shake as tolerated °• Sip fluids slowly and throughout the day °• May use sugar substitutes in small amounts (no more than 6-8 packets per day; i.e. Splenda) ° °Fluid Goal °• The first goal is to drink at least 8 ounces of protein shake/drink per day (or as directed by the nutritionist); some examples of protein shakes are Syntrax, Nectar, Adkins Advantage, EAS Edge HP, and Unjury. - See handout from pre-op Bariatric Education Class: °o Slowly increase the amount of protein shake you drink as tolerated °o You may find it easier to slowly sip shakes throughout the day °o It is important to get your proteins in first °• Your fluid goal is to drink 64-100 ounces of fluid daily °o It may take a few weeks  to build up to this  °• 32 oz. (or more) should be full liquids (see below for examples) °• Liquids should not contain sugar, caffeine, or carbonation ° °Clear Liquids: °• Water of Sugar-free flavored water (i.e. Fruit H²O, Propel) °• Decaffeinated coffee or tea (sugar-free) °• Crystal lite, Wyler’s Lite, Minute Maid Lite °• Sugar-free Jell-O °• Bouillon or broth °• Sugar-free Popsicle:    - Less than 20 calories each; Limit 1 per day ° ° ° ° °  ° Full Liquids: °                  Protein Shakes/Drinks + 2 choices per day of other full liquids °• Full liquids must be: °o No More Than 12 grams of Carbs per serving °o No More Than 3 grams   of Fat per serving °• Strained low-fat cream soup °• Non-Fat milk °• Fat-free Lactaid Milk °• Sugar-free yogurt (Dannon Lite & Fit, Greek yogurt) °  °Vitamins and Minerals • Start 1 day after surgery unless otherwise directed by your surgeon °• 1 Chewable Multivitamin / Multimineral Supplement with iron (i.e. Centrum for Adults) °• Chewable Calcium Citrate with Vitamin D-3 °(Example: 3 Chewable Calcium  Plus 600 with vitamin D-3) °o Take 500 mg three (3) times a day for a total of 1500 mg per day °o Do not take all 3 doses of calcium at one time as it may cause constipation, and you can only absorb 500 mg at a time °o Do not mix multivitamins containing iron with calcium supplements;  take 2 hours apart °o Do not substitute Tums (calcium carbonate) for your calcium °• Menstruating women and those at risk for anemia ( a blood disease that causes weakness) may need extra iron °o Talk to your doctor to see if you need more iron °• If you need extra iron: total daily iron recommendation (including Vitamins) is 50 to 100 mg Iron/day °• Do not stop taking or change any vitamins or minerals until you talk to your nutritionist or surgeon °• Your nutritionist and/or surgeon must approve all vitamin and mineral supplements  °Activity and Exercise: It is important to continue walking at home.  Limit your physical activity as instructed by your doctor. During this time, use these guidelines: °• Do not lift anything greater than ten  (10) pounds for at least two (2) weeks °• Do not go back to work or drive until your surgeon says you can °• You may have sex when you feel comfortable °o It is VERY important for male patients to use a reliable birth control method; fertility often increase after surgery °o Do not get pregnant for at least 18 months °• Start exercising as soon as your doctor tells you that you can °o Make sure your doctor approves any physical activity °• Start with a simple walking program °• Walk 5-15 minutes each day, 7 days per week °• Slowly increase until you are walking 30-45 minutes per day °• Consider joining our BELT program. [phone] or email [email or web address] ° °  °Special Instructions  Things to remember: °• Free counseling is available for you and your family through collaboration between [facility] and [employer]. Please call [phone] and leave a message °• Use your CPAP when sleeping if this applies to you °• Consider buying a medical alert bracelet that says you had lap-band surgery °• You will likely have your first fill (fluid added to your band) 6 - 8 weeks after surgery °• [facility] has a free Bariatric Surgery Support Group that meets monthly, the 3rd Thursday, 6pm. [facility] Classrooms. You can see classes online at [email or web address] °• It is very important to keep all follow up appointments with your surgeon, nutritionist, primary care physician, and behavioral health practitioner °o After the first year, please follow up with your bariatric surgeon and nutritionist at least once a year in order to maintain best weight loss results °      °             [facility]:  [phone] ° °              [facility]: [phone] ° °             Bariatric Nurse Coordinator:   [phone] °  °   Adjustable Gastric Band Home Care Instructions  Rev. 05/2012                                                            ° °     Reviewed and Endorsed °                                                  by [facility] Patient Education Committee, Jan, 2014 ° °

## 2013-04-09 NOTE — Anesthesia Postprocedure Evaluation (Signed)
  Anesthesia Post-op Note  Patient: [name]  Procedure(s) Performed: Procedure(s) (LRB): LAPAROSCOPIC GASTRIC BANDING WITH possible  HIATAL HERNIA REPAIR (N/A)  Patient Location: PACU  Anesthesia Type: General  Level of Consciousness: awake and alert   Airway and Oxygen Therapy: Patient Spontanous Breathing  Post-op Pain: mild  Post-op Assessment: Post-op Vital signs reviewed, Patient's Cardiovascular Status Stable, Respiratory Function Stable, Patent Airway and No signs of Nausea or vomiting  Last Vitals:  Filed Vitals:   04/09/13 0544  BP: 118/68  Pulse: 56  Temp: 36.6 C  Resp: 18    Post-op Vital Signs: stable   Complications: No apparent anesthesia complications

## 2013-04-10 NOTE — Discharge Summary (Signed)
Physician Discharge Summary  [name] RKY:[id number] DOB: 05/10/1945 DOA: 04/08/2013  PCP: [name], MD  Admit date: 04/08/2013 Discharge date: 04/09/2013  Recommendations for Outpatient Follow-up:  1.   Follow-up Information   Follow up with Gayland Curry, MD On 04/23/2013. (10:15 AM)    Specialty:  General Surgery   Contact information:   [address] [place] [address] [phone]      Discharge Diagnoses:  Active Problems:   HYPERLIPIDEMIA   GOUT   Dysmetabolic syndrome X   DEPRESSION   HTN (hypertension)   BENIGN PROSTATIC HYPERTROPHY   SLEEP APNEA   Morbid obesity with BMI of 45.0-49.9, adult  Surgical Procedure: Laparoscopic adjustable gastric band placement with hiatal hernia repair  Discharge Condition: Good Disposition: Home  Diet recommendation: Post operative lap band diet  Filed Weights   04/08/13 0514  Weight: 333 lb 4 oz (151.161 kg)    Hospital Course:  68 year old Caucasian male was admitted for a planned laparoscopic adjustable gastric band placement with possible hiatal hernia repair. He received 5000 units of subcutaneous heparin preoperatively. During surgery he was found to have a hiatal hernia which was repaired in addition to placement of the laparoscopic adjustable gastric band. He was kept overnight for observation. He was continued on CPAP at night. He was started on liquids after surgery. On postoperative day one he was tolerating a full liquid diet. He was ambulating without difficulty. His vital signs are stable. His pain was well-controlled. He had received discharge instructions. He was deemed stable for discharge   Discharge Instructions  Discharge Orders   Future Appointments Provider Department Dept Phone   04/22/2013 3:30 PM [facility] [phone]   04/23/2013 10:15 AM Gayland Curry, MD [facility], [place] [phone]   Future  Orders Complete By Expires   Increase activity slowly  As directed        Medication List    STOP taking these medications       aspirin 325 MG tablet      TAKE these medications       allopurinol 300 MG tablet  Commonly known as:  ZYLOPRIM  Take 300 mg by mouth daily with breakfast.     amLODipine 5 MG tablet  Commonly known as:  NORVASC  Take 5 mg by mouth daily with breakfast.     finasteride 5 MG tablet  Commonly known as:  PROSCAR  Take 5 mg by mouth daily.     FLAX SEED OIL PO  Take 1 tablet by mouth daily.     gabapentin 100 MG capsule  Commonly known as:  NEURONTIN  Take 100 mg by mouth 2 (two) times daily.     GLUCOSAMINE PO  Take 1 tablet by mouth daily.     LORazepam 0.5 MG tablet  Commonly known as:  ATIVAN  Take 0.5-1 mg by mouth 2 (two) times daily.     multivitamin tablet  Take 1 tablet by mouth daily.     oxyCODONE 5 MG/5ML solution  Commonly known as:  ROXICODONE  Take 5-10 mLs (5-10 mg total) by mouth every 4 (four) hours as needed for severe pain.     pravastatin 40 MG tablet  Commonly known as:  PRAVACHOL  Take 40 mg by mouth at bedtime.     risperiDONE 0.5 MG tablet  Commonly known as:  RISPERDAL  Take 0.5 mg by mouth at bedtime.  sertraline 100 MG tablet  Commonly known as:  ZOLOFT  Take 150 mg by mouth daily with breakfast.           Follow-up Information   Follow up with Gayland Curry, MD On 04/23/2013. (10:15 AM)    Specialty:  General Surgery   Contact information:   [address] [place] [address] [phone]        The results of significant diagnostics from this hospitalization (including imaging, microbiology, ancillary and laboratory) are listed below for reference.    Significant Diagnostic Studies: Dg Abd 1 View  04/08/2013   CLINICAL DATA:  Status post lap band placement.  EXAM: ABDOMEN - 1 VIEW  COMPARISON:  CT abdomen and pelvis 11/28/2012.  FINDINGS: Lap band is in place. Tubing is  intact. The band appears correctly oriented. Bowel gas pattern is unremarkable.  IMPRESSION: Status post placement of a lap band without evidence of complication.   Electronically Signed   By: Inge Rise M.D.   On: 04/08/2013 14:30    Microbiology: No results found for this or any previous visit (from the past 240 hour(s)).   Labs: Basic Metabolic Panel:  Recent Labs Lab 04/04/13 1403  NA 141  K 4.2  CL 104  CO2 24  GLUCOSE 103*  BUN 21  CREATININE 1.00  CALCIUM 9.0   Liver Function Tests:  Recent Labs Lab 04/04/13 1403  AST 18  ALT 14  ALKPHOS 66  BILITOT 0.5  PROT 6.7  ALBUMIN 3.6   No results found for this basename: LIPASE, AMYLASE,  in the last 168 hours No results found for this basename: AMMONIA,  in the last 168 hours CBC:  Recent Labs Lab 04/04/13 1403 04/09/13 0600  WBC 9.3 11.0*  NEUTROABS 7.1 8.9*  HGB 14.5 13.8  HCT 42.5 42.0  MCV 91.8 93.5  PLT 233 221   Active Problems:   HYPERLIPIDEMIA   GOUT   Dysmetabolic syndrome X   DEPRESSION   HTN (hypertension)   BENIGN PROSTATIC HYPERTROPHY   SLEEP APNEA   Morbid obesity with BMI of 45.0-49.9, adult   Time coordinating discharge: 15 minutes  Signed:  Gayland Curry, MD [facility], [place] 04/10/2013, 8:41 AM

## 2013-04-21 ENCOUNTER — Other Ambulatory Visit: Payer: Self-pay | Admitting: Internal Medicine

## 2013-04-21 NOTE — Telephone Encounter (Signed)
Amlodipine-benazepril refilled per protocol. JG//CMA

## 2013-04-22 ENCOUNTER — Encounter: Payer: Medicare Other | Attending: General Surgery | Admitting: Dietician

## 2013-04-22 VITALS — Ht 72.0 in | Wt 328.5 lb

## 2013-04-22 DIAGNOSIS — Z09 Encounter for follow-up examination after completed treatment for conditions other than malignant neoplasm: Secondary | ICD-10-CM | POA: Diagnosis not present

## 2013-04-22 DIAGNOSIS — Z9884 Bariatric surgery status: Secondary | ICD-10-CM | POA: Diagnosis not present

## 2013-04-22 DIAGNOSIS — Z713 Dietary counseling and surveillance: Secondary | ICD-10-CM | POA: Diagnosis not present

## 2013-04-22 DIAGNOSIS — Z6841 Body Mass Index (BMI) 40.0 and over, adult: Secondary | ICD-10-CM

## 2013-04-22 NOTE — Progress Notes (Signed)
Bariatric Class:  Appt start time: 1530 end time:  1630.  2 Week Post-Operative Nutrition Class  Patient was seen on 04/22/13 for Post-Operative Nutrition [facility].   Surgery date: 04/07/2013 Surgery type: LAGB Starting weight at [facility]: 360 lbs on 08/29/12  Weight today: 328.5 lbs Weight change: 0 Total weight lost: 0  TANITA  BODY COMP RESULTS  04/22/13   BMI (kg/m^2) 52.1   Fat Mass (lbs) 179.0   Fat Free Mass (lbs) 143.5   Total Body Water (lbs) 105.0   The following the learning objectives were met by the patient during this course:  Identifies Phase 3A (Soft, High Proteins) Dietary Goals and will begin from 2 weeks post-operatively to 2 months post-operatively  Identifies appropriate sources of fluids and proteins   States protein recommendations and appropriate sources post-operatively  Identifies the need for appropriate texture modifications, mastication, and bite sizes when consuming solids  Identifies appropriate multivitamin and calcium sources post-operatively  Describes the need for physical activity post-operatively and will follow MD recommendations  States when to call healthcare provider regarding medication questions or post-operative complications  Handouts given during class include:  Phase 3A: Soft, High Protein Diet Handout  Follow-Up Plan: Patient will follow-up at [facility] in 4 weeks for 6 week post-op nutrition visit for diet advancement per MD.  HPI  Review of Systems    Objective:   Physical Exam

## 2013-04-23 ENCOUNTER — Encounter (INDEPENDENT_AMBULATORY_CARE_PROVIDER_SITE_OTHER): Payer: Self-pay | Admitting: General Surgery

## 2013-04-23 ENCOUNTER — Ambulatory Visit (INDEPENDENT_AMBULATORY_CARE_PROVIDER_SITE_OTHER): Payer: Medicare Other | Admitting: General Surgery

## 2013-04-23 VITALS — BP 132/81 | HR 77 | Temp 98.6°F | Resp 16 | Ht 72.0 in | Wt 325.8 lb

## 2013-04-23 DIAGNOSIS — Z9884 Bariatric surgery status: Secondary | ICD-10-CM | POA: Insufficient documentation

## 2013-04-23 NOTE — Progress Notes (Signed)
Subjective:     Patient ID: [name], male   DOB: [birth year], 68 y.o.   MRN: [id number]  HPI 68 year old Caucasian male comes in for followup after undergoing laparoscopic adjustable gastric band placement along with small hiatal hernia repair on 04/08/2013. He was kept overnight for observation.He states he is doing well. He denies any fever, chills, nausea or regurgitation. He denies any abdominal pain. He is walking. He denies any diarrhea or constipation  Review of Systems     Objective:   Physical Exam BP 132/81  Pulse 77  Temp(Src) 98.6 F (37 C) (Temporal)  Resp 16  Ht 6' (1.829 m)  Wt 325 lb 12.8 oz (147.782 kg)  BMI 44.18 kg/m2  See LapBand flowsheet  Gen: alert, NAD, non-toxic appearing HEENT: normocephalic, atraumatic; pupils equal, no scleral icterus, neck supple, no lymphadenopathy Pulm: Lungs clear to auscultation, symmetric chest rise CV: regular rate and rhythm Abd: soft, nontender, nondistended. Well-healed trocar sites. No incisional hernia. Port is in right mid-abdomen Ext: no edema, normal, symmetric strength Neuro: nonfocal, sensation grossly intact Psych: appropriate, judgment normal      Assessment:     Morbid obesity Status post laparoscopic adjustable gastric band placement and hiatal hernia repair Obstructive sleep apnea on CPAP Gout Hypertension History of hyperlipidemia Metabolic syndrome Prediabetic     Plan:     His initial visit weight back in the spring of 2014 was 361.6 pounds. His weight at his preoperative visit was 343.6 pounds. I congratulated him on his weight loss since surgery. He is only a little over 2 weeks out from surgery so it is too soon to do a lap band adjustment. Right now we discussed proper eating behaviors and techniques such as small bite, chewing 20-30 times, and waiting 20-30 seconds between bites of food. He was given access to the [facility] video for postoperative lap band. Followup with me in 2-3 weeks for  first lap band adjustment. At that time we will discuss treatment for his positive H. Pylori screening.  Leighton Ruff. Redmond Pulling, MD, FACS General, Bariatric, & Minimally Invasive Surgery [facility], [place]

## 2013-04-23 NOTE — Patient Instructions (Signed)
Eating techniques 20-20-20 (30-30-30) 20 chews, 20 seconds between bites of food, 20 minutes to eat; sometimes you may need 30 chews, 30 seconds etc Use your nondominant hand to eat with Put fork down between bites of food Use a child/infant size utensil Try not to eat while watching TV  Watch EMMI video

## 2013-04-29 ENCOUNTER — Encounter: Payer: Self-pay | Admitting: Family Medicine

## 2013-04-29 ENCOUNTER — Ambulatory Visit (INDEPENDENT_AMBULATORY_CARE_PROVIDER_SITE_OTHER): Payer: Medicare Other | Admitting: Family Medicine

## 2013-04-29 VITALS — BP 112/68 | HR 71 | Temp 98.8°F | Wt 328.0 lb

## 2013-04-29 DIAGNOSIS — J329 Chronic sinusitis, unspecified: Secondary | ICD-10-CM | POA: Diagnosis not present

## 2013-04-29 MED ORDER — CLARITHROMYCIN ER 500 MG PO TB24: 1000.0000 mg | ORAL_TABLET | Freq: Every day | ORAL | Status: AC

## 2013-04-29 NOTE — Progress Notes (Signed)
  Subjective:     [name] is a 68 y.o. male who presents for evaluation of sinus pain. Symptoms include: congestion, cough, nasal congestion, post nasal drip and sinus pressure. Onset of symptoms was 3 days ago. Symptoms have been gradually worsening since that time. Past history is significant for no history of pneumonia or bronchitis. Patient is a non-smoker.  The following portions of the patient's history were reviewed and updated as appropriate: allergies, current medications, past family history, past medical history, past social history, past surgical history and problem list.  Review of Systems Pertinent items are noted in HPI.   Objective:    BP 112/68  Pulse 71  Temp(Src) 98.8 F (37.1 C) (Oral)  Wt 328 lb (148.78 kg)  SpO2 95% General appearance: alert, cooperative, appears stated age and no distress Ears: normal TM's and external ear canals both ears Nose: green discharge, moderate congestion, turbinates red, swollen, sinus tenderness bilateral Throat: lips, mucosa, and tongue normal; teeth and gums normal Neck: mild anterior cervical adenopathy, supple, symmetrical, trachea midline and thyroid not enlarged, symmetric, no tenderness/mass/nodules Lungs: clear to auscultation bilaterally Heart: S1, S2 normal    Assessment:    Acute bacterial sinusitis.    Plan:    Nasal steroids per medication orders. Antihistamines per medication orders. Biaxin per medication orders.

## 2013-04-29 NOTE — Patient Instructions (Signed)

## 2013-04-29 NOTE — Progress Notes (Signed)
Pre visit review using our clinic review tool, if applicable. No additional management support is needed unless otherwise documented below in the visit note. 

## 2013-04-30 ENCOUNTER — Encounter: Payer: Self-pay | Admitting: Family Medicine

## 2013-05-06 ENCOUNTER — Other Ambulatory Visit: Payer: Self-pay | Admitting: *Deleted

## 2013-05-06 MED ORDER — GABAPENTIN 100 MG PO CAPS: 100.0000 mg | ORAL_CAPSULE | Freq: Two times a day (BID) | ORAL | Status: DC

## 2013-05-08 ENCOUNTER — Ambulatory Visit (INDEPENDENT_AMBULATORY_CARE_PROVIDER_SITE_OTHER): Payer: Medicare Other | Admitting: Physician Assistant

## 2013-05-08 ENCOUNTER — Encounter (INDEPENDENT_AMBULATORY_CARE_PROVIDER_SITE_OTHER): Payer: Self-pay

## 2013-05-08 VITALS — BP 120/78 | HR 68 | Temp 98.5°F | Resp 14 | Ht 72.0 in | Wt 322.8 lb

## 2013-05-08 DIAGNOSIS — Z9884 Bariatric surgery status: Secondary | ICD-10-CM

## 2013-05-08 NOTE — Patient Instructions (Signed)
Return in one month. Focus on good food choices as well as physical activity. Return sooner if you have an increase in hunger, portion sizes or weight. Return also for difficulty swallowing, night cough, reflux.

## 2013-05-08 NOTE — Progress Notes (Signed)
  HISTORY: [name] is a 68 y.o.male who received an AP-Large lap-band in January 2015 by Dr. Redmond Pulling. He comes in with 20 lbs weight loss since surgery and is very pleased with his progress. He reports having very little hunger at all and is continuing to take very small portions of 3-5 ounces each. He's had one episode of regurgitation when he ate too quickly and without chewing adequately. Unfortunately he's suffering a URI for which he's taking Biaxin. Post-nasal drip is a component of his syndrome.  VITAL SIGNS: Filed Vitals:   05/08/13 1403  BP: 120/78  Pulse: 68  Temp: 98.5 F (36.9 C)  Resp: 14    PHYSICAL EXAM: Physical exam reveals a very well-appearing 67 y.o.male in no apparent distress Neurologic: Awake, alert, oriented Psych: Bright affect, conversant Respiratory: Breathing even and unlabored. No stridor or wheezing Extremities: Atraumatic, good range of motion. Skin: Warm, Dry, no rashes Musculoskeletal: Normal gait, Joints normal  ASSESMENT: 68 y.o.  male  s/p AP-Large lap-band.   PLAN: As he's clearly in the green zone with good weight loss, little to no hunger and small portion sizes, we opted to not do a fill at this visit. Further, his URI may cause an artificial tightening of the band which we have seen in the past with others. As such, we'll have him return in one month to evaluate for a fill then. I gave him return criteria including symptoms of under and over restriction.

## 2013-05-12 ENCOUNTER — Telehealth (INDEPENDENT_AMBULATORY_CARE_PROVIDER_SITE_OTHER): Payer: Self-pay

## 2013-05-12 ENCOUNTER — Telehealth: Payer: Self-pay

## 2013-05-12 MED ORDER — ONDANSETRON HCL 4 MG PO TABS: 4.0000 mg | ORAL_TABLET | Freq: Three times a day (TID) | ORAL | Status: DC | PRN

## 2013-05-12 NOTE — Telephone Encounter (Signed)
The patient's wife called and is hoping to get a medicine called in for the pts nausea  Callback - [phone]

## 2013-05-12 NOTE — Telephone Encounter (Signed)
Patient wife calling into office to report that patient has contracted a "stomach bug"  Patient having nausea and diarrhea.  Patient wife was asking if we would call in something for nausea & diarrhea.  Advised patient wife that if they though it was a virus patient need's to be seen by his PCP for further work-up.  They did not feel it was related to the lap band.  Advised I would review with Dr. Redmond Pulling.  Per Dr. Redmond Pulling call in [place] for nausea and Imodium as indicated on bottle for diarrhea.  Patient wife agreeable with plan as above and verbalized understanding.  Prescription to be sent to [facility] on [address].

## 2013-05-12 NOTE — Telephone Encounter (Signed)
The pt's wife called and stated the gi dr called in something for nausea.  Please disregard msg   Thanks!

## 2013-05-20 ENCOUNTER — Ambulatory Visit: Payer: Self-pay | Admitting: Dietician

## 2013-05-23 ENCOUNTER — Encounter: Payer: Medicare Other | Attending: General Surgery | Admitting: Dietician

## 2013-05-23 VITALS — Ht 72.0 in | Wt 318.0 lb

## 2013-05-23 DIAGNOSIS — Z9884 Bariatric surgery status: Secondary | ICD-10-CM | POA: Diagnosis not present

## 2013-05-23 DIAGNOSIS — Z09 Encounter for follow-up examination after completed treatment for conditions other than malignant neoplasm: Secondary | ICD-10-CM | POA: Diagnosis not present

## 2013-05-23 DIAGNOSIS — Z713 Dietary counseling and surveillance: Secondary | ICD-10-CM | POA: Insufficient documentation

## 2013-05-23 DIAGNOSIS — Z6841 Body Mass Index (BMI) 40.0 and over, adult: Secondary | ICD-10-CM

## 2013-05-23 NOTE — Progress Notes (Signed)
  Follow-up visit:  6 Weeks Post-Operative LAGB Surgery  Medical Nutrition Therapy:  Appt start time: 1300 end time:  1330.  Primary concerns today: Post-operative Bariatric Surgery Nutrition Management.  [name] is here today reporting that he is feeling great! He was sick with the flu recently and is caring for 68 year old mother. He reports that he doesn't snack much. Water weight has increased significantly since last visit.  Surgery date: 04/08/2013  Surgery type: LAGB  Starting weight at [facility]: 360 lbs on 08/29/12  Weight today: 318 lbs  Goal: 190s  Weight change: 10.5 lbs  Total weight lost: 42 lbs  TANITA BODY COMP RESULTS   04/22/13  05/23/2013  BMI (kg/m^2)  52.1  43.1  Fat Mass (lbs)  179.0  115.5  Fat Free Mass (lbs)  143.5  202.5  Total Body Water (lbs)  105.0  148     Preferred Learning Style:   No preference indicated   Learning Readiness:  Ready  24-hr recall: B (AM): Premier protein shake (30g) Snk (AM): none  L (PM): Wendy's chili Snk (PM): none D (PM): Skewer of steak or chicken Snk (PM): 1 oz cheese  Fluid intake: drinking constantly, water, decaf coffee with cream, protein (at least 64 oz)  Estimated total protein intake: 80-90 per patient report  Medications: see list Supplementation: taking  Using straws: no Drinking while eating: no Hair loss: no Carbonated beverages: sips of ginger ale when sick N/V/D/C: none Last Lap-Band fill: no fill yet  Recent physical activity:  Improving; increased motility; plans to join BELT program  Progress Towards Goal(s):  In progress.  Handouts given during visit include:  Phase 3B protein + non starchy vegetables   Nutritional Diagnosis:  -3.3 Overweight/obesity related to past poor dietary habits and physical inactivity as evidenced by patient w/ recent LAGB surgery following dietary guidelines for continued weight loss.    Intervention:  Nutrition education provided.  Teaching Method Utilized:   Visual Auditory  Barriers to learning/adherence to lifestyle change: none  Demonstrated degree of understanding via:  Teach Back   Monitoring/Evaluation:  Dietary intake, exercise, lap band fills, and body weight. Follow up in 6 weeks for 3 month post-op visit.

## 2013-05-23 NOTE — Patient Instructions (Signed)
Goals:  Follow Phase 3B: High Protein + Non-Starchy Vegetables  Eat 3-6 small meals/snacks, every 3-5 hrs  Increase lean protein foods to meet 80g goal  Increase fluid intake to 64oz +  Avoid drinking 15 minutes before, during and 30 minutes after eating  Aim for >30 min of physical activity daily

## 2013-06-03 ENCOUNTER — Ambulatory Visit (INDEPENDENT_AMBULATORY_CARE_PROVIDER_SITE_OTHER): Payer: Medicare Other | Admitting: Psychiatry

## 2013-06-03 ENCOUNTER — Encounter (HOSPITAL_COMMUNITY): Payer: Self-pay | Admitting: Psychiatry

## 2013-06-03 VITALS — BP 126/66 | HR 60 | Ht 72.0 in | Wt 319.8 lb

## 2013-06-03 DIAGNOSIS — F339 Major depressive disorder, recurrent, unspecified: Secondary | ICD-10-CM | POA: Diagnosis not present

## 2013-06-03 DIAGNOSIS — F329 Major depressive disorder, single episode, unspecified: Secondary | ICD-10-CM

## 2013-06-03 MED ORDER — LORAZEPAM 0.5 MG PO TABS
0.5000 mg | ORAL_TABLET | ORAL | Status: DC
Start: 2013-06-03 — End: 2013-07-08

## 2013-06-03 MED ORDER — SERTRALINE HCL 100 MG PO TABS: 150.0000 mg | ORAL_TABLET | Freq: Every day | ORAL | Status: DC

## 2013-06-03 MED ORDER — RISPERIDONE 0.5 MG PO TABS
0.5000 mg | ORAL_TABLET | Freq: Every day | ORAL | Status: DC
Start: 2013-06-03 — End: 2013-09-03

## 2013-06-03 NOTE — Progress Notes (Signed)
[facility] Progress Note  [name] NIEHOFF [id number] 68 y.o.  06/03/2013 1:51 PM  Chief Complaint:  Medication management and followup.    History of Present Illness:  [name] came for his followup appointment.  He has lost significant weight after his lap band surgery.  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  He feels proud that he accomplished something.  He is compliant with his medication.  He denies any side effects.  He was to continue his current psychotropic medication.  He has no tremors shakes or any side effects.  Denies any agitation, irritability or any anger.  He is sleeping better.   Suicidal Ideation: No Plan Formed: No Patient has means to carry out plan: No  Homicidal Ideation: No Plan Formed: No Patient has means to carry out plan: No  Review of Systems: Psychiatric: Agitation: No Hallucination: No Depressed Mood: No Insomnia: No Hypersomnia: No Altered Concentration: No Feels Worthless: No Grandiose Ideas: No Belief In Special Powers: No New/Increased Substance Abuse: No Compulsions: No  Neurologic: Headache: No Seizure: No Paresthesias: No  Past Medical History  Diagnosis Date  . Benign prostatic hypertrophy   . Hypertension   . Gout   . Depression   . FH: colonic polyps   . Morbid obesity   . Complication of anesthesia     MORPHINE CAUSES HALLUCINATIONS  . Asthma     childhood asthma  . Numbness     feet / legs  . Cancer   . History of kidney stones   . Kidney cysts   . Sleep apnea     USES C PAP  . Bilateral hip pain   Patient see Dr. [name].  Psychosocial History: Patient lives with his wife.  His wife is very supportive.  Outpatient Encounter Prescriptions as of 06/03/2013  Medication Sig  . allopurinol (ZYLOPRIM) 300 MG tablet Take 300 mg by mouth daily with breakfast.   . amLODipine-benazepril (LOTREL) 5-10 MG per capsule TAKE ONE  CAPSULE BY MOUTH ONCE DAILY  . finasteride (PROSCAR) 5 MG tablet Take 5 mg by mouth daily.   [name] gabapentin (NEURONTIN) 100 MG capsule Take 1 capsule (100 mg total) by mouth 2 (two) times daily.  [name] LORazepam (ATIVAN) 0.5 MG tablet Take 1 tablet (0.5 mg total) by mouth as directed. 1 TAB IN THE MORNING AND 2 IN THE EVENING  . Multiple Vitamin (MULTIVITAMIN) tablet Take 1 tablet by mouth daily.   . pravastatin (PRAVACHOL) 40 MG tablet Take 40 mg by mouth at bedtime.  . risperiDONE (RISPERDAL) 0.5 MG tablet Take 1 tablet (0.5 mg total) by mouth at bedtime.  . sertraline (ZOLOFT) 100 MG tablet Take 1.5 tablets (150 mg total) by mouth daily with breakfast.  . [DISCONTINUED] LORazepam (ATIVAN) 0.5 MG tablet Take 0.5 mg by mouth as directed. 1 TAB IN THE MORNING AND 2 IN THE EVENING  . [DISCONTINUED] risperiDONE (RISPERDAL) 0.5 MG tablet Take 0.5 mg by mouth at bedtime.  . [DISCONTINUED] sertraline (ZOLOFT) 100 MG tablet Take 150 mg by mouth daily with breakfast.  . [DISCONTINUED] ondansetron (ZOFRAN) 4 MG tablet Take 1 tablet (4 mg total) by mouth every 8 (eight) hours as needed for nausea or vomiting.  . [DISCONTINUED] pediatric multivitamin-fluoride/iron (VI-DAYLIN/F + IRON) 0.25-10 MG/ML SOLN Take by mouth daily.    Recent Results (from the past 2160 hour(s))  CBC WITH DIFFERENTIAL     Status:  None   Collection Time    04/04/13  2:03 PM      Result Value Ref Range   WBC 9.3  4.0 - 10.5 K/uL   RBC 4.63  4.22 - 5.81 MIL/uL   Hemoglobin 14.5  13.0 - 17.0 g/dL   HCT 42.5  39.0 - 52.0 %   MCV 91.8  78.0 - 100.0 fL   MCH 31.3  26.0 - 34.0 pg   MCHC 34.1  30.0 - 36.0 g/dL   RDW 13.8  11.5 - 15.5 %   Platelets 233  150 - 400 K/uL   Neutrophils Relative % 76  43 - 77 %   Neutro Abs 7.1  1.7 - 7.7 K/uL   Lymphocytes Relative 15  12 - 46 %   Lymphs Abs 1.4  0.7 - 4.0 K/uL   Monocytes Relative 8  3 - 12 %   Monocytes Absolute 0.8  0.1 - 1.0 K/uL   Eosinophils Relative 1  0 - 5 %   Eosinophils  Absolute 0.1  0.0 - 0.7 K/uL   Basophils Relative 1  0 - 1 %   Basophils Absolute 0.1  0.0 - 0.1 K/uL  COMPREHENSIVE METABOLIC PANEL     Status: Abnormal   Collection Time    04/04/13  2:03 PM      Result Value Ref Range   Sodium 141  137 - 147 mEq/L   Comment: Please note change in reference range.   Potassium 4.2  3.7 - 5.3 mEq/L   Comment: Please note change in reference range.   Chloride 104  96 - 112 mEq/L   CO2 24  19 - 32 mEq/L   Glucose, Bld 103 (*) 70 - 99 mg/dL   BUN 21  6 - 23 mg/dL   Creatinine, Ser 1.00  0.50 - 1.35 mg/dL   Calcium 9.0  8.4 - 10.5 mg/dL   Total Protein 6.7  6.0 - 8.3 g/dL   Albumin 3.6  3.5 - 5.2 g/dL   AST 18  0 - 37 U/L   ALT 14  0 - 53 U/L   Alkaline Phosphatase 66  39 - 117 U/L   Total Bilirubin 0.5  0.3 - 1.2 mg/dL   GFR calc non Af Amer 76 (*) >90 mL/min   GFR calc Af Amer 88 (*) >90 mL/min   Comment: (NOTE)     The eGFR has been calculated using the CKD EPI equation.     This calculation has not been validated in all clinical situations.     eGFR's persistently <90 mL/min signify possible Chronic Kidney     Disease.  CBC WITH DIFFERENTIAL     Status: Abnormal   Collection Time    04/09/13  6:00 AM      Result Value Ref Range   WBC 11.0 (*) 4.0 - 10.5 K/uL   RBC 4.49  4.22 - 5.81 MIL/uL   Hemoglobin 13.8  13.0 - 17.0 g/dL   HCT 42.0  39.0 - 52.0 %   MCV 93.5  78.0 - 100.0 fL   MCH 30.7  26.0 - 34.0 pg   MCHC 32.9  30.0 - 36.0 g/dL   RDW 13.9  11.5 - 15.5 %   Platelets 221  150 - 400 K/uL   Neutrophils Relative % 81 (*) 43 - 77 %   Neutro Abs 8.9 (*) 1.7 - 7.7 K/uL   Lymphocytes Relative 8 (*) 12 - 46 %   Lymphs Abs 0.9  0.7 - 4.0 K/uL   Monocytes Relative 11  3 - 12 %   Monocytes Absolute 1.2 (*) 0.1 - 1.0 K/uL   Eosinophils Relative 0  0 - 5 %   Eosinophils Absolute 0.0  0.0 - 0.7 K/uL   Basophils Relative 0  0 - 1 %   Basophils Absolute 0.0  0.0 - 0.1 K/uL    Past Psychiatric History/Hospitalization(s) Patient has one  psychiatric admission in 2006 due to increased depression and having suicidal thoughts.  Since then patient has been seen in this office and feeling stable on his psychiatric medication. Anxiety: Yes Bipolar Disorder: No Depression: Yes Mania: No Psychosis: No Schizophrenia: No Personality Disorder: No Hospitalization for psychiatric illness: Yes History of Electroconvulsive Shock Therapy: No Prior Suicide Attempts: No  Physical Exam: Constitutional:  BP 126/66  Pulse 60  Ht 6' (1.829 m)  Wt 319 lb 12.8 oz (145.06 kg)  BMI 43.36 kg/m2  Musculoskeletal: Strength & Muscle Tone: within normal limits Gait & Station: normal Patient leans: N/A  Mental Status Examination;  Patient is obese man who is casually dressed and fairly groomed.  He maintains good eye contact.  He is pleasant and cooperative.  His thought process is logical linear and goal directed.  There were no flight if ideas of any loose association.  He denies any active or passive suicidal thoughts and homicidal thoughts.  His fund of knowledge is adequate.  There were no paranoia or delusions present at this time.  His attention concentration is fair.  He is alert and oriented x3.  He described his mood as okay and his affect is mood appropriate.  His insight judgment and impulse control is okay.   Established Problem, Stable/Improving (1), Review or order clinical lab tests (1), Review of Last Therapy Session (1) and Review of Medication Regimen & Side Effects (2)  Assessment: Axis I: Maj. depressive disorder, recurrent  Axis II: Deferred  Axis III:  Past Medical History  Diagnosis Date  . Benign prostatic hypertrophy   . Hypertension   . Gout   . Depression   . FH: colonic polyps   . Morbid obesity   . Complication of anesthesia     MORPHINE CAUSES HALLUCINATIONS  . Asthma     childhood asthma  . Numbness     feet / legs  . Cancer   . History of kidney stones   . Kidney cysts   . Sleep apnea     USES  C PAP  . Bilateral hip pain     Axis IV: Mild   Plan:  I will continue his current psychotropic medication.  He is taking Zoloft 150 mg daily, Risperdal 0.5 mg at bedtime and Ativan 0.5 mg 1 in the morning and 2 at bedtime.  Recommend to call us back if he has any questions or concerns otherwise I will see him again in 3 months.    Taro Hidrogo T., MD 06/03/2013

## 2013-06-05 ENCOUNTER — Encounter (INDEPENDENT_AMBULATORY_CARE_PROVIDER_SITE_OTHER): Payer: Self-pay | Admitting: Physician Assistant

## 2013-06-05 ENCOUNTER — Ambulatory Visit (INDEPENDENT_AMBULATORY_CARE_PROVIDER_SITE_OTHER): Payer: Medicare Other | Admitting: Physician Assistant

## 2013-06-05 VITALS — BP 124/74 | HR 76 | Temp 97.6°F | Resp 20 | Ht 72.0 in | Wt 313.0 lb

## 2013-06-05 DIAGNOSIS — Z9884 Bariatric surgery status: Secondary | ICD-10-CM

## 2013-06-05 NOTE — Patient Instructions (Signed)
Return in one month. Focus on good food choices as well as physical activity. Return sooner if you have an increase in hunger, portion sizes or weight. Return also for difficulty swallowing, night cough, reflux.

## 2013-06-05 NOTE — Progress Notes (Signed)
  HISTORY: HAYK DIVIS is a 68 y.o.male who received an AP-Large lap-band in January 2015 by Dr. Redmond Pulling. He comes in with close to 10 lbs weight loss since his last visit one month ago. No fill was done at that time. He reports very good control of his hunger and taking small meals. Protein shakes hold him for many hours. He's not grazing as he once was. He denies regurgitation or reflux symptoms. He was battling a URI last month which fortunately has completely resolved.  VITAL SIGNS: Filed Vitals:   06/05/13 0947  BP: 124/74  Pulse: 76  Temp: 97.6 F (36.4 C)  Resp: 20    PHYSICAL EXAM: Physical exam reveals a very well-appearing 67 y.o.male in no apparent distress Neurologic: Awake, alert, oriented Psych: Bright affect, conversant Respiratory: Breathing even and unlabored. No stridor or wheezing Extremities: Atraumatic, good range of motion. Skin: Warm, Dry, no rashes Musculoskeletal: Normal gait, Joints normal  ASSESMENT: 68 y.o.  male  s/p AP-Large lap-band.   PLAN: He's clearly in the green zone, exceeding the 1-2 lbs / week weight loss rate we usually expect. He isn't enthusiastic about a fill today so we deferred until next month. I encouraged him to maintain the current course and to contact us should he have any changes.

## 2013-07-03 ENCOUNTER — Ambulatory Visit (INDEPENDENT_AMBULATORY_CARE_PROVIDER_SITE_OTHER): Payer: Medicare Other | Admitting: Physician Assistant

## 2013-07-03 ENCOUNTER — Encounter (INDEPENDENT_AMBULATORY_CARE_PROVIDER_SITE_OTHER): Payer: Self-pay

## 2013-07-03 DIAGNOSIS — Z9884 Bariatric surgery status: Secondary | ICD-10-CM

## 2013-07-03 NOTE — Patient Instructions (Signed)
Return in one month. Focus on good food choices as well as physical activity. Return sooner if you have an increase in hunger, portion sizes or weight. Return also for difficulty swallowing, night cough, reflux.

## 2013-07-03 NOTE — Progress Notes (Signed)
  HISTORY: [name] is a 68 y.o.male who received an AP-Large lap-band in January 2015 by Dr. Redmond Pulling. He comes in today with 2 lbs weight loss since his last visit one month ago. He says reducing his rate of loss is deliberate. He noticed his bottom 4 teeth becoming loose and was concerned this was secondary to decreased nutrition and calcium. He has added one small meal a day and has increased his calcium intake as well. He visited his dentist but reports having received no good information from that encounter. Since increasing his intake, three of the four teeth have stabilized. He's ready to get some more weight off but thinks he'll do fine with just eliminating the fourth meal. He has no hunger at this point.  VITAL SIGNS: Filed Vitals:   07/03/13 1122  BP: 122/80  Pulse: 78    PHYSICAL EXAM: Physical exam reveals a very well-appearing 67 y.o.male in no apparent distress Neurologic: Awake, alert, oriented Psych: Bright affect, conversant Respiratory: Breathing even and unlabored. No stridor or wheezing Extremities: Atraumatic, good range of motion. Skin: Warm, Dry, no rashes Musculoskeletal: Normal gait, Joints normal  ASSESMENT: 68 y.o.  male  s/p AP-large lap-band.   PLAN: He wants to get his weight down without a fill today. He didn't feel dissatisfied with his level of hunger and satiety last month, so he wants to go back to that level of intake to see if he can get some more weight off. If he hasn't lost enough by our next visit, we'll endeavor to do a fill at that time.

## 2013-07-08 ENCOUNTER — Other Ambulatory Visit (HOSPITAL_COMMUNITY): Payer: Self-pay | Admitting: Psychiatry

## 2013-07-08 DIAGNOSIS — F329 Major depressive disorder, single episode, unspecified: Secondary | ICD-10-CM

## 2013-07-08 NOTE — Telephone Encounter (Signed)
Received refill request thru surescript for Ativan  Contacted patient.Informed pt that written RX for 30 day supply given 06/03/13 at last appt with 2 refills.He states he took it to [facility], so he will check with them and call office if any difficultiy filling.

## 2013-07-08 NOTE — Telephone Encounter (Signed)
Received copy of prescription for Ativan from [facility] to pt on 06/03/13.Original RX for quantity of 30. MD directions on prescription and appt notes state: Take 1 in AM and 2 in evening.

## 2013-07-09 ENCOUNTER — Encounter: Payer: Medicare Other | Attending: General Surgery | Admitting: Dietician

## 2013-07-09 DIAGNOSIS — Z09 Encounter for follow-up examination after completed treatment for conditions other than malignant neoplasm: Secondary | ICD-10-CM | POA: Insufficient documentation

## 2013-07-09 DIAGNOSIS — Z713 Dietary counseling and surveillance: Secondary | ICD-10-CM | POA: Insufficient documentation

## 2013-07-09 DIAGNOSIS — Z9884 Bariatric surgery status: Secondary | ICD-10-CM | POA: Diagnosis not present

## 2013-07-09 NOTE — Patient Instructions (Addendum)
Goals:  Follow Phase 3B: High Protein + Non-Starchy Vegetables  Eat 3-6 small meals/snacks, every 3-5 hrs  Increase lean protein foods to meet 80g goal  Increase fluid intake to 64oz +  Avoid drinking 15 minutes before, during and 30 minutes after eating  Aim for >30 min of physical activity daily  Consider adding a vitamin D supplement  Focus more on protein foods rather than protein shakes  Cheese, meat, eggs, seafood  TANITA BODY COMP RESULTS   04/22/13  05/23/13 07/09/13  BMI (kg/m^2)  52.1  43.1 41.4  Fat Mass (lbs)  179.0  115.5 109  Fat Free Mass (lbs)  143.5  202.5 196.5  Total Body Water (lbs)  105.0  148 144

## 2013-07-09 NOTE — Progress Notes (Signed)
  Follow-up visit:  10 Weeks Post-Operative LAGB Surgery  Medical Nutrition Therapy:  Appt start time: 1300 end time:  1330.  Primary concerns today: Post-operative Bariatric Surgery Nutrition Management.  [name] returns today having lost 12.5 pounds since his last visit. He reports that his bottom 4 teeth have been loose. He has been to the dentist but they are unsure of the cause. He has been taking extra calcium pills. Having 2 protein shakes and 1 meals a day. Rarely gets hungry; satisfied with protein shakes.   Surgery date: 04/08/2013  Surgery type: LAGB  Starting weight at [facility]: 360 lbs on 08/29/12  Weight today: 305.5 lbs Goal: 190s  Weight change: 12.5 lbs  Total weight lost: 54.5 lbs  TANITA BODY COMP RESULTS   04/22/13  05/23/13 07/09/13  BMI (kg/m^2)  52.1  43.1 41.4  Fat Mass (lbs)  179.0  115.5 109  Fat Free Mass (lbs)  143.5  202.5 196.5  Total Body Water (lbs)  105.0  148 144     Preferred Learning Style:   No preference indicated   Learning Readiness:  Ready  24-hr recall: B (AM): Premier protein shake (30g) or sausage, bacon, and eggs Snk (AM): none  L (PM): Salmon or tuna and little vegetables Snk (PM): none D (PM): Premier protein shake Snk (PM): 1 oz cheese  Fluid intake: drinking constantly, water, decaf coffee with cream, protein shakes (at least 64 oz)  Estimated total protein intake: 80-90 per patient report  Medications: see list Supplementation: taking  Using straws: no Drinking while eating: no Hair loss: no; loose teeth Carbonated beverages: no N/V/D/C: constipation Last Lap-Band fill: no fill yet  Recent physical activity:  Improving; increased motility; plans to join BELT program  Progress Towards Goal(s):  In progress.   Handouts given during visit include:  Phase 3B protein + non starchy vegetables   Nutritional Diagnosis:  [place]-3.3 Overweight/obesity related to past poor dietary habits and physical inactivity as evidenced by  patient w/ recent LAGB surgery following dietary guidelines for continued weight loss.    Intervention:  Nutrition education provided.  Teaching Method Utilized:  Visual Auditory  Barriers to learning/adherence to lifestyle change: none  Demonstrated degree of understanding via:  Teach Back   Monitoring/Evaluation:  Dietary intake, exercise, lap band fills, and body weight. Follow up in 3 months for 6 month post-op visit.

## 2013-07-13 ENCOUNTER — Encounter (HOSPITAL_COMMUNITY): Payer: Self-pay | Admitting: Emergency Medicine

## 2013-07-13 ENCOUNTER — Emergency Department (HOSPITAL_COMMUNITY): Payer: Medicare Other

## 2013-07-13 ENCOUNTER — Emergency Department (HOSPITAL_COMMUNITY)
Admission: EM | Admit: 2013-07-13 | Discharge: 2013-07-13 | Disposition: A | Discharge status: Home / Self Care | Payer: Medicare Other | Attending: Emergency Medicine | Admitting: Emergency Medicine

## 2013-07-13 DIAGNOSIS — I509 Heart failure, unspecified: Secondary | ICD-10-CM | POA: Diagnosis not present

## 2013-07-13 DIAGNOSIS — Z87891 Personal history of nicotine dependence: Secondary | ICD-10-CM | POA: Diagnosis not present

## 2013-07-13 DIAGNOSIS — I10 Essential (primary) hypertension: Secondary | ICD-10-CM | POA: Insufficient documentation

## 2013-07-13 DIAGNOSIS — F329 Major depressive disorder, single episode, unspecified: Secondary | ICD-10-CM | POA: Insufficient documentation

## 2013-07-13 DIAGNOSIS — G4730 Sleep apnea, unspecified: Secondary | ICD-10-CM | POA: Insufficient documentation

## 2013-07-13 DIAGNOSIS — Z87448 Personal history of other diseases of urinary system: Secondary | ICD-10-CM | POA: Diagnosis not present

## 2013-07-13 DIAGNOSIS — M25519 Pain in unspecified shoulder: Secondary | ICD-10-CM | POA: Diagnosis not present

## 2013-07-13 DIAGNOSIS — R748 Abnormal levels of other serum enzymes: Secondary | ICD-10-CM

## 2013-07-13 DIAGNOSIS — M109 Gout, unspecified: Secondary | ICD-10-CM | POA: Insufficient documentation

## 2013-07-13 DIAGNOSIS — Z859 Personal history of malignant neoplasm, unspecified: Secondary | ICD-10-CM | POA: Diagnosis not present

## 2013-07-13 DIAGNOSIS — Z96659 Presence of unspecified artificial knee joint: Secondary | ICD-10-CM | POA: Diagnosis not present

## 2013-07-13 DIAGNOSIS — R42 Dizziness and giddiness: Secondary | ICD-10-CM | POA: Insufficient documentation

## 2013-07-13 DIAGNOSIS — F3289 Other specified depressive episodes: Secondary | ICD-10-CM | POA: Insufficient documentation

## 2013-07-13 DIAGNOSIS — R609 Edema, unspecified: Secondary | ICD-10-CM | POA: Diagnosis not present

## 2013-07-13 DIAGNOSIS — R1011 Right upper quadrant pain: Secondary | ICD-10-CM | POA: Diagnosis not present

## 2013-07-13 DIAGNOSIS — Z9884 Bariatric surgery status: Secondary | ICD-10-CM | POA: Diagnosis not present

## 2013-07-13 DIAGNOSIS — Z7982 Long term (current) use of aspirin: Secondary | ICD-10-CM | POA: Insufficient documentation

## 2013-07-13 DIAGNOSIS — J90 Pleural effusion, not elsewhere classified: Secondary | ICD-10-CM | POA: Diagnosis not present

## 2013-07-13 DIAGNOSIS — Z79899 Other long term (current) drug therapy: Secondary | ICD-10-CM | POA: Insufficient documentation

## 2013-07-13 DIAGNOSIS — J45901 Unspecified asthma with (acute) exacerbation: Secondary | ICD-10-CM | POA: Insufficient documentation

## 2013-07-13 DIAGNOSIS — Z87442 Personal history of urinary calculi: Secondary | ICD-10-CM | POA: Diagnosis not present

## 2013-07-13 DIAGNOSIS — Z9981 Dependence on supplemental oxygen: Secondary | ICD-10-CM | POA: Insufficient documentation

## 2013-07-13 DIAGNOSIS — N400 Enlarged prostate without lower urinary tract symptoms: Secondary | ICD-10-CM | POA: Insufficient documentation

## 2013-07-13 DIAGNOSIS — Z880 Allergy status to penicillin: Secondary | ICD-10-CM | POA: Diagnosis not present

## 2013-07-13 LAB — URINALYSIS, ROUTINE W REFLEX MICROSCOPIC
GLUCOSE, UA: NEGATIVE mg/dL
HGB URINE DIPSTICK: NEGATIVE
Ketones, ur: NEGATIVE mg/dL
Nitrite: NEGATIVE
Protein, ur: NEGATIVE mg/dL
SPECIFIC GRAVITY, URINE: 1.023 (ref 1.005–1.030)
Urobilinogen, UA: 1 mg/dL (ref 0.0–1.0)
pH: 5 (ref 5.0–8.0)

## 2013-07-13 LAB — COMPREHENSIVE METABOLIC PANEL
ALT: 141 U/L — ABNORMAL HIGH (ref 0–53)
AST: 95 U/L — AB (ref 0–37)
Albumin: 2.8 g/dL — ABNORMAL LOW (ref 3.5–5.2)
Alkaline Phosphatase: 314 U/L — ABNORMAL HIGH (ref 39–117)
BILIRUBIN TOTAL: 1.2 mg/dL (ref 0.3–1.2)
BUN: 34 mg/dL — ABNORMAL HIGH (ref 6–23)
CALCIUM: 9 mg/dL (ref 8.4–10.5)
CHLORIDE: 100 meq/L (ref 96–112)
CO2: 22 meq/L (ref 19–32)
Creatinine, Ser: 1.09 mg/dL (ref 0.50–1.35)
GFR calc Af Amer: 79 mL/min — ABNORMAL LOW (ref >90)
GFR, EST NON AFRICAN AMERICAN: 68 mL/min — AB (ref >90)
Glucose, Bld: 109 mg/dL — ABNORMAL HIGH (ref 70–99)
Potassium: 3.7 mEq/L (ref 3.7–5.3)
Sodium: 137 mEq/L (ref 137–147)
Total Protein: 6.9 g/dL (ref 6.0–8.3)

## 2013-07-13 LAB — URINE MICROSCOPIC-ADD ON

## 2013-07-13 LAB — CBC WITH DIFFERENTIAL/PLATELET
Basophils Absolute: 0 10*3/uL (ref 0.0–0.1)
Basophils Relative: 0 % (ref 0–1)
EOS PCT: 1 % (ref 0–5)
Eosinophils Absolute: 0.1 10*3/uL (ref 0.0–0.7)
HCT: 36.9 % — ABNORMAL LOW (ref 39.0–52.0)
Hemoglobin: 12.6 g/dL — ABNORMAL LOW (ref 13.0–17.0)
LYMPHS ABS: 1 10*3/uL (ref 0.7–4.0)
LYMPHS PCT: 7 % — AB (ref 12–46)
MCH: 31 pg (ref 26.0–34.0)
MCHC: 34.1 g/dL (ref 30.0–36.0)
MCV: 90.7 fL (ref 78.0–100.0)
MONO ABS: 1.4 10*3/uL — AB (ref 0.1–1.0)
Monocytes Relative: 11 % (ref 3–12)
Neutro Abs: 10.7 10*3/uL — ABNORMAL HIGH (ref 1.7–7.7)
Neutrophils Relative %: 81 % — ABNORMAL HIGH (ref 43–77)
Platelets: 383 10*3/uL (ref 150–400)
RBC: 4.07 MIL/uL — AB (ref 4.22–5.81)
RDW: 14.5 % (ref 11.5–15.5)
WBC: 13.2 10*3/uL — AB (ref 4.0–10.5)

## 2013-07-13 LAB — I-STAT TROPONIN, ED
Troponin i, poc: 0 ng/mL (ref 0.00–0.08)
Troponin i, poc: 0 ng/mL (ref 0.00–0.08)

## 2013-07-13 LAB — PRO B NATRIURETIC PEPTIDE: PRO B NATRI PEPTIDE: 519.5 pg/mL — AB (ref 0–125)

## 2013-07-13 MED ORDER — FUROSEMIDE 20 MG PO TABS: 20.0000 mg | ORAL_TABLET | Freq: Every day | ORAL | Status: DC

## 2013-07-13 MED ORDER — TRAMADOL HCL 50 MG PO TABS
50.0000 mg | ORAL_TABLET | Freq: Once | ORAL | Status: AC
  Administered 2013-07-13: 50 mg via ORAL
  Filled 2013-07-13: qty 1

## 2013-07-13 MED ORDER — TRAMADOL HCL 50 MG PO TABS: 50.0000 mg | ORAL_TABLET | Freq: Four times a day (QID) | ORAL | Status: DC | PRN

## 2013-07-13 MED ORDER — FUROSEMIDE 40 MG PO TABS
20.0000 mg | ORAL_TABLET | Freq: Once | ORAL | Status: AC
  Administered 2013-07-13: 20 mg via ORAL
  Filled 2013-07-13: qty 1

## 2013-07-13 MED ORDER — ASPIRIN 325 MG PO TABS
325.0000 mg | ORAL_TABLET | Freq: Once | ORAL | Status: AC
  Administered 2013-07-13: 325 mg via ORAL
  Filled 2013-07-13: qty 1

## 2013-07-13 NOTE — Discharge Instructions (Signed)
Take motrin for pain. For severe pain, take tramadol as prescribed. Do NOT drive with it.   Stop taking statins.   Take lasix 20 mg daily for 5 days.   Please see your doctor this week. You may need an echo to check your heart. You also need repeat liver function tests.   Return to ER if you have severe chest pain, shortness of breath, abdominal pain, vomiting, weakness.

## 2013-07-13 NOTE — ED Notes (Signed)
Pt ambulated with pulsox, O2 sat fluctuated between 92-95%, pulse 99

## 2013-07-13 NOTE — ED Notes (Signed)
Pt states that he was on a medical diet march 14-December 14 and then had lap band surgery in January. Pt recently in past 5 days has had pain between his shoulder blades and weakness. States his lower teeth were loose so he started taking 4, 500mg  calcium tablets a day and added vitamin D approx. 4-5 days ago. States he is fatigued and has had some lightheadedness.

## 2013-07-13 NOTE — ED Provider Notes (Signed)
CSN: [id number]     Arrival date & time 07/13/13  1635 History   First MD Initiated Contact with Patient 07/13/13 1658     Chief Complaint  Patient presents with  . Back Pain  . Weakness     (Consider location/radiation/quality/duration/timing/severity/associated sxs/prior Treatment) The history is provided by the patient.  [name] is a 68 y.o. male hx of BPH, HTN here with R shoulder pain, sob. Pain between his right shoulder blades for the last week or so. It's worse when he exerts himself. He notes that when ever he walks up the stairs he gets short of breath. He is also more tired than usual. Feels lightheaded as well. He is on a protein diet after his lap and surgery in January. Denies any abdominal pain or vomiting. Denies history of CAD but father had MI age 24.    Past Medical History  Diagnosis Date  . Benign prostatic hypertrophy   . Hypertension   . Gout   . Depression   . FH: colonic polyps   . Morbid obesity   . Complication of anesthesia     MORPHINE CAUSES HALLUCINATIONS  . Asthma     childhood asthma  . Numbness     feet / legs  . Cancer   . History of kidney stones   . Kidney cysts   . Sleep apnea     USES C PAP  . Bilateral hip pain    Past Surgical History  Procedure Laterality Date  . Total knee replacment      bilat  . Cholecystectomy  1989  . Trigger finger repair      also lue, cts surgically repaired  . Renal calculi    . Tonsillectomy    . Lithotripsy    . Arthroscopy knee w/ drilling    . Breath tek h pylori N/A 12/23/2012    Procedure: BREATH TEK H PYLORI;  Surgeon: Gayland Curry, MD;  Location: [facility];  Service: General;  Laterality: N/A;  . Wisdom tooth extraction    . Laparoscopic gastric banding with hiatal hernia repair N/A 04/08/2013    Procedure: LAPAROSCOPIC GASTRIC BANDING WITH possible  HIATAL HERNIA REPAIR;  Surgeon: Gayland Curry, MD;  Location: [facility];  Service: General;  Laterality: N/A;   Family History   Problem Relation Age of Onset  . Depression Mother   . Hypertension Mother   . Heart disease Father     MI  . Depression Brother   . Stroke Paternal Aunt     CVA  . Diabetes Paternal Uncle   . Heart disease Paternal Uncle     MI  . Heart disease Paternal Grandmother     MI  . Diabetes Cousin     PATERNAL   History  Substance Use Topics  . Smoking status: Former Smoker    Types: Cigarettes    Quit date: 08/30/1974  . Smokeless tobacco: Not on file  . Alcohol Use: Yes     Comment: Occasionally; 1 small drink a week    Review of Systems  Respiratory: Positive for shortness of breath.   Musculoskeletal: Positive for back pain.  Neurological: Positive for weakness.  All other systems reviewed and are negative.     Allergies  Propoxyphene n-acetaminophen; Codeine; Morphine; and Penicillins  Home Medications   Current Outpatient Rx  Name  Route  Sig  Dispense  Refill  . allopurinol (ZYLOPRIM) 300 MG tablet   Oral   Take 300  mg by mouth daily with breakfast.          . amLODipine-benazepril (LOTREL) 5-10 MG per capsule   Oral   Take 1 capsule by mouth daily.         [name] aspirin 325 MG tablet   Oral   Take 325 mg by mouth daily.         . calcium gluconate 500 MG tablet   Oral   Take 1 tablet by mouth 3 (three) times daily.         . cholecalciferol (VITAMIN D) 1000 UNITS tablet   Oral   Take 2,000 Units by mouth daily.         . finasteride (PROSCAR) 5 MG tablet   Oral   Take 5 mg by mouth daily.          . Flaxseed, Linseed, (FLAX SEED OIL) 1300 MG CAPS   Oral   Take 1,300 mg by mouth daily.         [name] gabapentin (NEURONTIN) 100 MG capsule   Oral   Take 1 capsule (100 mg total) by mouth 2 (two) times daily.   60 capsule   2     90 day supply with no refill is ok if patient pref ...   . Glucosamine-Chondroit-Vit C-Mn (GLUCOSAMINE 1500 COMPLEX PO)   Oral   Take 1 tablet by mouth daily.         [name] LORazepam (ATIVAN) 0.5 MG tablet    Oral   Take 0.5-1 mg by mouth 2 (two) times daily. 0.5 mg in the morning and 1 mg at bedtime         . Multiple Vitamin (MULTIVITAMIN) tablet   Oral   Take 1 tablet by mouth daily.          . pravastatin (PRAVACHOL) 40 MG tablet   Oral   Take 40 mg by mouth at bedtime.         . risperiDONE (RISPERDAL) 0.5 MG tablet   Oral   Take 1 tablet (0.5 mg total) by mouth at bedtime.   30 tablet   2   . sertraline (ZOLOFT) 100 MG tablet   Oral   Take 1.5 tablets (150 mg total) by mouth daily with breakfast.   45 tablet   2   . vitamin B-12 (CYANOCOBALAMIN) 1000 MCG tablet   Oral   Take 1,000 mcg by mouth daily.          BP 106/62  Pulse 89  Temp(Src) 99.3 F (37.4 C) (Oral)  Resp 20  SpO2 92% Physical Exam  Nursing note and vitals reviewed. Constitutional: He is oriented to person, place, and time.  Chronically ill, slightly uncomfortable   HENT:  Head: Normocephalic.  Mouth/Throat: Oropharynx is clear and moist.  Eyes: Conjunctivae are normal. Pupils are equal, round, and reactive to light.  Neck: Normal range of motion. Neck supple.  Cardiovascular: Normal rate, regular rhythm and normal heart sounds.   Pulmonary/Chest: Effort normal.  Crackles bilateral bases   Abdominal: Soft. Bowel sounds are normal. He exhibits no distension. There is no tenderness. There is no rebound.  Musculoskeletal: Normal range of motion. He exhibits no tenderness.  2+ edema bilaterally   Neurological: He is alert and oriented to person, place, and time.  Skin: Skin is warm and dry.  Psychiatric: He has a normal mood and affect. His behavior is normal. Judgment and thought content normal.    ED Course  Procedures (including critical care  time) Labs Review Labs Reviewed  CBC WITH DIFFERENTIAL - Abnormal; Notable for the following:    WBC 13.2 (*)    RBC 4.07 (*)    Hemoglobin 12.6 (*)    HCT 36.9 (*)    Neutrophils Relative % 81 (*)    Neutro Abs 10.7 (*)    Lymphocytes  Relative 7 (*)    Monocytes Absolute 1.4 (*)    All other components within normal limits  URINALYSIS, ROUTINE W REFLEX MICROSCOPIC - Abnormal; Notable for the following:    Color, Urine AMBER (*)    Bilirubin Urine SMALL (*)    Leukocytes, UA TRACE (*)    All other components within normal limits  PRO B NATRIURETIC PEPTIDE - Abnormal; Notable for the following:    Pro B Natriuretic peptide (BNP) 519.5 (*)    All other components within normal limits  COMPREHENSIVE METABOLIC PANEL - Abnormal; Notable for the following:    Glucose, Bld 109 (*)    BUN 34 (*)    Albumin 2.8 (*)    AST 95 (*)    ALT 141 (*)    Alkaline Phosphatase 314 (*)    GFR calc non Af Amer 68 (*)    GFR calc Af Amer 79 (*)    All other components within normal limits  URINE MICROSCOPIC-ADD ON  Randolm Idol, ED  Randolm Idol, ED   Imaging Review Dg Chest 2 View  07/13/2013   CLINICAL DATA:  Weakness and chest pain.  EXAM: CHEST - 2 VIEW  COMPARISON:  DG CHEST 2 VIEW dated 09/26/2012; DG THORACIC SPINE dated 01/19/2011  FINDINGS: The heart is mildly enlarged. Mild interstitial and pulmonary vascular prominence identified without evidence of overt edema. There is a small left pleural effusion. Bony structures show stable mild degenerative disease of the thoracic spine.  IMPRESSION: Cardiomegaly and pulmonary interstitial prominence with small left pleural effusion. No overt edema is identified   Electronically Signed   By: Aletta Edouard M.D.   On: 07/13/2013 18:16   US Abdomen Limited  07/13/2013   CLINICAL DATA:  Increased liver function tests, right upper quadrant pain  EXAM: US ABDOMEN LIMITED - RIGHT UPPER QUADRANT  COMPARISON:  None.  FINDINGS: Gallbladder:  Surgically at  Common bile duct:  Diameter: 7 mm  Liver:  No focal abnormalities.  IMPRESSION: No acute abnormalities   Electronically Signed   By: Skipper Cliche M.D.   On: 07/13/2013 20:37     EKG Interpretation None      MDM   Final  diagnoses:  None   [name] is a 67 y.o. male here with shoulder pain, SOB on exertion, weakness. Differential is broad. Consider CHF vs unstable angina vs electrolyte abnormality vs sepsis. Will get BNP, labs, CXR.   9:17 PM BNP elevated at 500. CXR showed mild failure but no pneumonia. UA nl. LFTs elevated compared to baseline but RUQ US unremarkable (he had previous cholecystectomy). He is on statins that likely caused it. Delta trop neg so I doubt ACS. His Pulse was 92-94% while laying and 95% while walking. Never hypoxic. I think he may have mild CHF. Given lasix 20 mg x 5 days. He has outpatient f/u and can get echo outpatient. I told him to d/c his statins.    Wandra Arthurs, MD 07/13/13 2120

## 2013-07-14 ENCOUNTER — Telehealth: Payer: Self-pay

## 2013-07-14 NOTE — Telephone Encounter (Signed)
There are 21 patients on schedule & I have a NP Student. Earliest he can be seen is 8 oe 8:30 in am. Sorry

## 2013-07-14 NOTE — Telephone Encounter (Signed)
The patient's wife called and is hoping to get her husband in for a hosp follow up.  She is insisting the appointment has to happen today.  Your schedule is already double booked at 3pm.    Let me know if you want this pt worked in.

## 2013-07-14 NOTE — Telephone Encounter (Signed)
Patient scheduled in next available slot, 3pm tomorrow. Thanks!

## 2013-07-14 NOTE — Telephone Encounter (Signed)
[name] is here 4/14 ; check with her as to double booking in am . Thanks, [employer]

## 2013-07-15 ENCOUNTER — Encounter: Payer: Self-pay | Admitting: Internal Medicine

## 2013-07-15 ENCOUNTER — Ambulatory Visit (INDEPENDENT_AMBULATORY_CARE_PROVIDER_SITE_OTHER): Payer: Medicare Other | Admitting: Internal Medicine

## 2013-07-15 ENCOUNTER — Other Ambulatory Visit: Payer: Medicare Other

## 2013-07-15 ENCOUNTER — Ambulatory Visit (INDEPENDENT_AMBULATORY_CARE_PROVIDER_SITE_OTHER)
Admission: RE | Admit: 2013-07-15 | Discharge: 2013-07-15 | Disposition: A | Discharge status: Home / Self Care | Payer: Medicare Other | Source: Ambulatory Visit | Attending: Internal Medicine | Admitting: Internal Medicine

## 2013-07-15 ENCOUNTER — Ambulatory Visit: Payer: Self-pay | Admitting: Internal Medicine

## 2013-07-15 VITALS — BP 100/60 | HR 86 | Temp 100.6°F | Resp 15 | Wt 307.0 lb

## 2013-07-15 DIAGNOSIS — R0609 Other forms of dyspnea: Secondary | ICD-10-CM

## 2013-07-15 DIAGNOSIS — I3139 Other pericardial effusion (noninflammatory): Secondary | ICD-10-CM

## 2013-07-15 DIAGNOSIS — I313 Pericardial effusion (noninflammatory): Secondary | ICD-10-CM

## 2013-07-15 DIAGNOSIS — R0989 Other specified symptoms and signs involving the circulatory and respiratory systems: Secondary | ICD-10-CM

## 2013-07-15 DIAGNOSIS — J90 Pleural effusion, not elsewhere classified: Secondary | ICD-10-CM | POA: Diagnosis not present

## 2013-07-15 DIAGNOSIS — R0600 Dyspnea, unspecified: Secondary | ICD-10-CM

## 2013-07-15 DIAGNOSIS — R509 Fever, unspecified: Secondary | ICD-10-CM | POA: Diagnosis not present

## 2013-07-15 DIAGNOSIS — I2699 Other pulmonary embolism without acute cor pulmonale: Secondary | ICD-10-CM

## 2013-07-15 DIAGNOSIS — R079 Chest pain, unspecified: Secondary | ICD-10-CM

## 2013-07-15 DIAGNOSIS — I319 Disease of pericardium, unspecified: Secondary | ICD-10-CM

## 2013-07-15 LAB — D-DIMER, QUANTITATIVE (NOT AT ARMC): D DIMER QUANT: 2.3 ug{FEU}/mL — AB (ref 0.00–0.48)

## 2013-07-15 MED ORDER — RIVAROXABAN 15 MG PO TABS: 15.0000 mg | ORAL_TABLET | Freq: Two times a day (BID) | ORAL | Status: DC

## 2013-07-15 NOTE — Progress Notes (Signed)
Pre visit review using our clinic review tool, if applicable. No additional management support is needed unless otherwise documented below in the visit note. 

## 2013-07-15 NOTE — Progress Notes (Signed)
Subjective:    Patient ID: [name], male    DOB: 04/27/1945, 68 y.o.   MRN: [id number]  HPI  The emergency room records from 07/13/13 were reviewed. He was seen for inter scapular pain which has been present for one week; subsequently he had pain in the anterior chest. He was having exertional dyspnea. He also describes a cough;he does  not produce any secretions for visualization.  He was found to have rales on exam and 2+ edema.  Chest x-ray revealed cardiomegaly with interstitial vascular prominence and small left effusion.  O2 sats were 92-94%. Troponin was negative x2  White count was 13,200. A mild anemia of 36.9 was present.  Creatinine was normal at GFR was mildly reduced at 68.  He had significant increase in AST at 95 an ALT of 141. He was told to stop his statin.  BNP was 519.5.  There were diffuse nonspecific ST-T changes not present on his most recent EKG 03/07/13.  Tramadol has helped the discomfort slightly. He was prescribed furosemide 20 mg which appears to help his shortness of breath some.  Family history is positive for myocardial infarction in his  father at 39. He has been sedentary since Bariatric surgery 04/08/2013    Review of Systems  He is now having sharp, constant substernal pain. This is not associated with nausea or  diaphoresis and is nonexertional. With cough it radiates posteriorly  He continues to have a cough but is not able to visualize the sputum.  He had low grade  fever until this office visit.     Objective:   Physical Exam Gen.:  well-nourished in appearance. Alert, appropriate and cooperative throughout exam. Appears younger than stated age  Head: Normocephalic without obvious abnormalities  Eyes: No corneal or conjunctival inflammation noted. Pupils equal round reactive to light and accommodation.  Ears: External  ear exam reveals no significant lesions or deformities. Canals clear .TMs normal. Hearing is grossly normal  bilaterally. Nose: External nasal exam reveals no deformity or inflammation. Nasal mucosa are pink and moist. No lesions or exudates noted.   Mouth: Oral mucosa and oropharynx reveal no lesions or exudates. Teeth in good repair. Neck: No deformities, masses, or tenderness noted. Thyroid normal. Lungs: Normal respiratory effort; chest expands symmetrically. Lungs:minimal lower lobe rales w/o wheezes or increased work of breathing.Cough with clear sputum Heart: Normal rate and rhythm. Normal S1 and S2. S4 gallop, click, or rub.No murmur. Abdomen: Bowel sounds normal; abdomen soft and nontender. No masses, organomegaly or hernias noted. Op scars healed                                Musculoskeletal/extremities:No clubbing, cyanosis, edema, or significant extremity  deformity noted. Range of motion normal .Tone & strength normal. Hand joints normal  Fingernail  health good. Able to lie down & sit up w/o help. Negative  Homan's bilaterally Vascular: Carotid, radial artery, dorsalis pedis and  posterior tibial pulses are full and equal. No bruits present. Neurologic: Alert and oriented x3. Deep tendon reflexes symmetrical and normal.  Gait normal  including heel & toe walking . Rhomberg & finger to nose       Skin: Intact without suspicious lesions or rashes. Lymph: No cervical, axillary lymphadenopathy present. Psych: Anxious but normally interactive  Assessment & Plan:  #1 chest pain; R/O PTE #2 dyspnea #3 fever See orders. Xarelto samples as prophylaxis

## 2013-07-15 NOTE — Patient Instructions (Addendum)
Your next office appointment will be determined based upon review of your pending labs & x-rays. Those instructions will be transmitted to you through My Chart. Increase the furosemide 20 mg to 2 pills a day. Take the Xarelto 15 mg twice a day until notified otherwise. Stop Aspirin.  07/16/13 5:38 PM: I received the results from the CT angiogram;this does reveal a pulmonary thromboemboli subsegmentally in the right lower lobe. It also shows an irregular pericardial effusion. The Xarelto potentially places him at risk for hemorrhagic pericardial effusion; the Radiologist states it does not appear to be hemorrhagic at this time. His EKG 07/15/13 showed diffuse nonspecific ST-T changes with some T. Inversion in the V. Leads. It did not show classic pericarditis EKG changes. I've discussed the findings with Mr. Torrance. I've recommended he go to the [facility] ER for admission for observation. An echocardiogram will be necessary to assess the effusion. IV heparin may be the safest option in place of Xarelto until the significance of the effusion can be ascertained and definitive treatment completed as clinically indicated.

## 2013-07-16 ENCOUNTER — Ambulatory Visit (INDEPENDENT_AMBULATORY_CARE_PROVIDER_SITE_OTHER)
Admission: RE | Admit: 2013-07-16 | Discharge: 2013-07-16 | Disposition: A | Discharge status: Home / Self Care | Payer: Medicare Other | Source: Ambulatory Visit | Attending: Internal Medicine | Admitting: Internal Medicine

## 2013-07-16 ENCOUNTER — Inpatient Hospital Stay (HOSPITAL_COMMUNITY)
Admission: EM | Admit: 2013-07-16 | Discharge: 2013-07-25 | DRG: 163 | Disposition: A | Discharge status: Home / Self Care | Payer: Medicare Other | Attending: Internal Medicine | Admitting: Internal Medicine

## 2013-07-16 ENCOUNTER — Encounter (HOSPITAL_COMMUNITY): Payer: Self-pay | Admitting: Emergency Medicine

## 2013-07-16 ENCOUNTER — Other Ambulatory Visit: Payer: Self-pay | Admitting: Internal Medicine

## 2013-07-16 DIAGNOSIS — E876 Hypokalemia: Secondary | ICD-10-CM | POA: Diagnosis not present

## 2013-07-16 DIAGNOSIS — I5030 Unspecified diastolic (congestive) heart failure: Secondary | ICD-10-CM | POA: Diagnosis present

## 2013-07-16 DIAGNOSIS — I2699 Other pulmonary embolism without acute cor pulmonale: Secondary | ICD-10-CM | POA: Insufficient documentation

## 2013-07-16 DIAGNOSIS — R0989 Other specified symptoms and signs involving the circulatory and respiratory systems: Secondary | ICD-10-CM | POA: Diagnosis not present

## 2013-07-16 DIAGNOSIS — G9331 Postviral fatigue syndrome: Secondary | ICD-10-CM | POA: Diagnosis present

## 2013-07-16 DIAGNOSIS — Z823 Family history of stroke: Secondary | ICD-10-CM

## 2013-07-16 DIAGNOSIS — K5900 Constipation, unspecified: Secondary | ICD-10-CM | POA: Diagnosis not present

## 2013-07-16 DIAGNOSIS — R0600 Dyspnea, unspecified: Secondary | ICD-10-CM

## 2013-07-16 DIAGNOSIS — A048 Other specified bacterial intestinal infections: Secondary | ICD-10-CM | POA: Diagnosis present

## 2013-07-16 DIAGNOSIS — I319 Disease of pericardium, unspecified: Secondary | ICD-10-CM | POA: Diagnosis not present

## 2013-07-16 DIAGNOSIS — I4891 Unspecified atrial fibrillation: Secondary | ICD-10-CM

## 2013-07-16 DIAGNOSIS — R918 Other nonspecific abnormal finding of lung field: Secondary | ICD-10-CM | POA: Diagnosis not present

## 2013-07-16 DIAGNOSIS — I309 Acute pericarditis, unspecified: Secondary | ICD-10-CM | POA: Diagnosis present

## 2013-07-16 DIAGNOSIS — Z87442 Personal history of urinary calculi: Secondary | ICD-10-CM

## 2013-07-16 DIAGNOSIS — K5730 Diverticulosis of large intestine without perforation or abscess without bleeding: Secondary | ICD-10-CM | POA: Diagnosis present

## 2013-07-16 DIAGNOSIS — D62 Acute posthemorrhagic anemia: Secondary | ICD-10-CM | POA: Diagnosis not present

## 2013-07-16 DIAGNOSIS — Z7901 Long term (current) use of anticoagulants: Secondary | ICD-10-CM | POA: Diagnosis not present

## 2013-07-16 DIAGNOSIS — R0609 Other forms of dyspnea: Secondary | ICD-10-CM | POA: Diagnosis not present

## 2013-07-16 DIAGNOSIS — Z885 Allergy status to narcotic agent status: Secondary | ICD-10-CM

## 2013-07-16 DIAGNOSIS — I5031 Acute diastolic (congestive) heart failure: Secondary | ICD-10-CM | POA: Diagnosis not present

## 2013-07-16 DIAGNOSIS — D72829 Elevated white blood cell count, unspecified: Secondary | ICD-10-CM | POA: Diagnosis present

## 2013-07-16 DIAGNOSIS — Z7982 Long term (current) use of aspirin: Secondary | ICD-10-CM

## 2013-07-16 DIAGNOSIS — I498 Other specified cardiac arrhythmias: Secondary | ICD-10-CM | POA: Diagnosis not present

## 2013-07-16 DIAGNOSIS — R079 Chest pain, unspecified: Secondary | ICD-10-CM

## 2013-07-16 DIAGNOSIS — M109 Gout, unspecified: Secondary | ICD-10-CM | POA: Diagnosis present

## 2013-07-16 DIAGNOSIS — I313 Pericardial effusion (noninflammatory): Secondary | ICD-10-CM | POA: Insufficient documentation

## 2013-07-16 DIAGNOSIS — Z9884 Bariatric surgery status: Secondary | ICD-10-CM | POA: Diagnosis not present

## 2013-07-16 DIAGNOSIS — J45909 Unspecified asthma, uncomplicated: Secondary | ICD-10-CM | POA: Diagnosis not present

## 2013-07-16 DIAGNOSIS — I10 Essential (primary) hypertension: Secondary | ICD-10-CM

## 2013-07-16 DIAGNOSIS — F3289 Other specified depressive episodes: Secondary | ICD-10-CM | POA: Diagnosis present

## 2013-07-16 DIAGNOSIS — I509 Heart failure, unspecified: Secondary | ICD-10-CM

## 2013-07-16 DIAGNOSIS — E785 Hyperlipidemia, unspecified: Secondary | ICD-10-CM | POA: Diagnosis present

## 2013-07-16 DIAGNOSIS — K759 Inflammatory liver disease, unspecified: Secondary | ICD-10-CM | POA: Diagnosis present

## 2013-07-16 DIAGNOSIS — K921 Melena: Secondary | ICD-10-CM | POA: Diagnosis not present

## 2013-07-16 DIAGNOSIS — K648 Other hemorrhoids: Secondary | ICD-10-CM | POA: Diagnosis present

## 2013-07-16 DIAGNOSIS — I3139 Other pericardial effusion (noninflammatory): Secondary | ICD-10-CM | POA: Diagnosis present

## 2013-07-16 DIAGNOSIS — F329 Major depressive disorder, single episode, unspecified: Secondary | ICD-10-CM | POA: Diagnosis present

## 2013-07-16 DIAGNOSIS — K254 Chronic or unspecified gastric ulcer with hemorrhage: Secondary | ICD-10-CM

## 2013-07-16 DIAGNOSIS — G4733 Obstructive sleep apnea (adult) (pediatric): Secondary | ICD-10-CM | POA: Diagnosis present

## 2013-07-16 DIAGNOSIS — Z8249 Family history of ischemic heart disease and other diseases of the circulatory system: Secondary | ICD-10-CM

## 2013-07-16 DIAGNOSIS — Z6841 Body Mass Index (BMI) 40.0 and over, adult: Secondary | ICD-10-CM

## 2013-07-16 DIAGNOSIS — Z880 Allergy status to penicillin: Secondary | ICD-10-CM

## 2013-07-16 DIAGNOSIS — R7989 Other specified abnormal findings of blood chemistry: Secondary | ICD-10-CM

## 2013-07-16 DIAGNOSIS — G933 Postviral fatigue syndrome: Secondary | ICD-10-CM | POA: Diagnosis present

## 2013-07-16 DIAGNOSIS — Z87891 Personal history of nicotine dependence: Secondary | ICD-10-CM

## 2013-07-16 DIAGNOSIS — Z6839 Body mass index (BMI) 39.0-39.9, adult: Secondary | ICD-10-CM

## 2013-07-16 DIAGNOSIS — J90 Pleural effusion, not elsewhere classified: Secondary | ICD-10-CM | POA: Diagnosis not present

## 2013-07-16 DIAGNOSIS — N400 Enlarged prostate without lower urinary tract symptoms: Secondary | ICD-10-CM | POA: Diagnosis present

## 2013-07-16 DIAGNOSIS — Z96659 Presence of unspecified artificial knee joint: Secondary | ICD-10-CM

## 2013-07-16 DIAGNOSIS — J9819 Other pulmonary collapse: Secondary | ICD-10-CM | POA: Diagnosis not present

## 2013-07-16 DIAGNOSIS — R791 Abnormal coagulation profile: Secondary | ICD-10-CM

## 2013-07-16 DIAGNOSIS — R0602 Shortness of breath: Secondary | ICD-10-CM | POA: Diagnosis not present

## 2013-07-16 DIAGNOSIS — G4730 Sleep apnea, unspecified: Secondary | ICD-10-CM

## 2013-07-16 HISTORY — DX: Unspecified diastolic (congestive) heart failure: I50.30

## 2013-07-16 LAB — CBC
HCT: 36.6 % — ABNORMAL LOW (ref 39.0–52.0)
Hemoglobin: 12.2 g/dL — ABNORMAL LOW (ref 13.0–17.0)
MCH: 30.3 pg (ref 26.0–34.0)
MCHC: 33.3 g/dL (ref 30.0–36.0)
MCV: 90.8 fL (ref 78.0–100.0)
Platelets: 340 10*3/uL (ref 150–400)
RBC: 4.03 MIL/uL — ABNORMAL LOW (ref 4.22–5.81)
RDW: 14.3 % (ref 11.5–15.5)
WBC: 13.8 10*3/uL — ABNORMAL HIGH (ref 4.0–10.5)

## 2013-07-16 LAB — I-STAT TROPONIN, ED: Troponin i, poc: 0.01 ng/mL (ref 0.00–0.08)

## 2013-07-16 LAB — BASIC METABOLIC PANEL
BUN: 38 mg/dL — ABNORMAL HIGH (ref 6–23)
CO2: 21 mEq/L (ref 19–32)
Calcium: 8.6 mg/dL (ref 8.4–10.5)
Chloride: 100 mEq/L (ref 96–112)
Creatinine, Ser: 1.35 mg/dL (ref 0.50–1.35)
GFR calc Af Amer: 61 mL/min — ABNORMAL LOW (ref >90)
GFR calc non Af Amer: 53 mL/min — ABNORMAL LOW (ref >90)
Glucose, Bld: 105 mg/dL — ABNORMAL HIGH (ref 70–99)
Potassium: 3.8 mEq/L (ref 3.7–5.3)
Sodium: 140 mEq/L (ref 137–147)

## 2013-07-16 LAB — TROPONIN I: Troponin I: 0.01 ng/mL (ref <0.06)

## 2013-07-16 LAB — ANTITHROMBIN III: AntiThromb III Func: 96 % (ref 75–120)

## 2013-07-16 MED ORDER — RIVAROXABAN 15 MG PO TABS
15.0000 mg | ORAL_TABLET | Freq: Two times a day (BID) | ORAL | Status: DC
  Administered 2013-07-17 (×3): 15 mg via ORAL
  Filled 2013-07-16 (×4): qty 1

## 2013-07-16 MED ORDER — SIMVASTATIN 20 MG PO TABS
20.0000 mg | ORAL_TABLET | Freq: Every day | ORAL | Status: DC
  Filled 2013-07-16: qty 1

## 2013-07-16 MED ORDER — AMLODIPINE BESYLATE 5 MG PO TABS
5.0000 mg | ORAL_TABLET | Freq: Every day | ORAL | Status: DC
  Administered 2013-07-17: 5 mg via ORAL
  Filled 2013-07-16: qty 1

## 2013-07-16 MED ORDER — IOHEXOL 350 MG/ML SOLN
80.0000 mL | Freq: Once | INTRAVENOUS | Status: AC | PRN
  Administered 2013-07-16: 80 mL via INTRAVENOUS

## 2013-07-16 MED ORDER — CALCIUM CARBONATE ANTACID 500 MG PO CHEW
250.0000 mg | CHEWABLE_TABLET | Freq: Three times a day (TID) | ORAL | Status: DC
  Administered 2013-07-17 – 2013-07-18 (×4): 250 mg via ORAL
  Administered 2013-07-19 (×3): via ORAL
  Administered 2013-07-20 – 2013-07-21 (×6): 250 mg via ORAL
  Administered 2013-07-22: 08:00:00 via ORAL
  Administered 2013-07-22: 250 mg via ORAL
  Administered 2013-07-23 (×3): via ORAL
  Administered 2013-07-24 – 2013-07-25 (×5): 250 mg via ORAL
  Filled 2013-07-16 (×28): qty 0.5

## 2013-07-16 MED ORDER — GABAPENTIN 100 MG PO CAPS
100.0000 mg | ORAL_CAPSULE | Freq: Two times a day (BID) | ORAL | Status: DC
  Administered 2013-07-17 – 2013-07-25 (×17): 100 mg via ORAL
  Filled 2013-07-16 (×20): qty 1

## 2013-07-16 MED ORDER — FINASTERIDE 5 MG PO TABS
5.0000 mg | ORAL_TABLET | Freq: Every day | ORAL | Status: DC
  Administered 2013-07-17 – 2013-07-25 (×8): 5 mg via ORAL
  Filled 2013-07-16 (×9): qty 1

## 2013-07-16 MED ORDER — VITAMIN B-12 1000 MCG PO TABS
1000.0000 ug | ORAL_TABLET | Freq: Every day | ORAL | Status: DC
  Administered 2013-07-17 – 2013-07-25 (×8): 1000 ug via ORAL
  Filled 2013-07-16 (×9): qty 1

## 2013-07-16 MED ORDER — BENAZEPRIL HCL 10 MG PO TABS
10.0000 mg | ORAL_TABLET | Freq: Every day | ORAL | Status: DC
  Administered 2013-07-17: 10 mg via ORAL
  Filled 2013-07-16: qty 1

## 2013-07-16 MED ORDER — FUROSEMIDE 10 MG/ML IJ SOLN
40.0000 mg | Freq: Every day | INTRAMUSCULAR | Status: DC
  Administered 2013-07-17 – 2013-07-20 (×3): 40 mg via INTRAVENOUS
  Filled 2013-07-16 (×5): qty 4

## 2013-07-16 MED ORDER — RISPERIDONE 0.5 MG PO TABS
0.5000 mg | ORAL_TABLET | Freq: Every day | ORAL | Status: DC
  Administered 2013-07-17 – 2013-07-24 (×9): 0.5 mg via ORAL
  Filled 2013-07-16 (×11): qty 1

## 2013-07-16 MED ORDER — AMLODIPINE BESY-BENAZEPRIL HCL 5-10 MG PO CAPS: 1.0000 | ORAL_CAPSULE | Freq: Every day | ORAL | Status: DC

## 2013-07-16 MED ORDER — LORAZEPAM 1 MG PO TABS
1.0000 mg | ORAL_TABLET | Freq: Every day | ORAL | Status: DC
  Administered 2013-07-17 – 2013-07-24 (×9): 1 mg via ORAL
  Filled 2013-07-16 (×3): qty 1
  Filled 2013-07-16: qty 2
  Filled 2013-07-16 (×5): qty 1

## 2013-07-16 MED ORDER — SERTRALINE HCL 50 MG PO TABS
150.0000 mg | ORAL_TABLET | Freq: Every day | ORAL | Status: DC
  Administered 2013-07-17 – 2013-07-25 (×8): 150 mg via ORAL
  Filled 2013-07-16 (×10): qty 1

## 2013-07-16 MED ORDER — ONDANSETRON HCL 4 MG/2ML IJ SOLN: 4.0000 mg | Freq: Four times a day (QID) | INTRAMUSCULAR | Status: DC | PRN

## 2013-07-16 MED ORDER — FLAX SEED OIL 1300 MG PO CAPS: 1300.0000 mg | ORAL_CAPSULE | Freq: Every day | ORAL | Status: DC

## 2013-07-16 MED ORDER — ASPIRIN 325 MG PO TABS
325.0000 mg | ORAL_TABLET | Freq: Every day | ORAL | Status: DC
  Administered 2013-07-17: 325 mg via ORAL
  Filled 2013-07-16 (×2): qty 1

## 2013-07-16 MED ORDER — ONDANSETRON HCL 4 MG PO TABS: 4.0000 mg | ORAL_TABLET | Freq: Four times a day (QID) | ORAL | Status: DC | PRN

## 2013-07-16 MED ORDER — ONDANSETRON HCL 4 MG/2ML IJ SOLN
4.0000 mg | Freq: Once | INTRAMUSCULAR | Status: DC
  Filled 2013-07-16: qty 2

## 2013-07-16 MED ORDER — HYDROMORPHONE HCL PF 1 MG/ML IJ SOLN: 1.0000 mg | INTRAMUSCULAR | Status: DC | PRN

## 2013-07-16 MED ORDER — IBUPROFEN 800 MG PO TABS
800.0000 mg | ORAL_TABLET | Freq: Once | ORAL | Status: AC
  Administered 2013-07-16: 800 mg via ORAL
  Filled 2013-07-16: qty 1

## 2013-07-16 MED ORDER — LORAZEPAM 0.5 MG PO TABS
0.5000 mg | ORAL_TABLET | Freq: Every day | ORAL | Status: DC
  Administered 2013-07-17 – 2013-07-25 (×8): 0.5 mg via ORAL
  Filled 2013-07-16 (×8): qty 1

## 2013-07-16 NOTE — ED Provider Notes (Signed)
CSN: [id number]     Arrival date & time 07/16/13  1906 History   First MD Initiated Contact with Patient 07/16/13 1945     Chief Complaint  Patient presents with  . Shortness of Breath  . Chest Pain     (Consider location/radiation/quality/duration/timing/severity/associated sxs/prior Treatment) HPI  67yM with CP which radiates to his back. Worsening over past couple weeks. Preceding this he had a severe "viral illness" about 3 months ago, followed by a persistent but no productive cough, wheezing and shortness of breath. He was seen at the urgent care, Dx with URI, and given symptomatic Tx. He saw his PCP, who was concerned about a PE, and thus started him on Xarelto and Lasix, and he presented to the ER today with same symptoms. He said his chest pain was with movement, better with lying down and with palpation. He has no orthopnea or PND. He denied fever, chills, nausea, vomiting or diaphoresis.   Past Medical History  Diagnosis Date  . Benign prostatic hypertrophy   . Hypertension   . Gout   . Depression   . FH: colonic polyps   . Morbid obesity   . Complication of anesthesia     MORPHINE CAUSES HALLUCINATIONS  . Asthma     childhood asthma  . Numbness     feet / legs  . Cancer   . History of kidney stones   . Kidney cysts   . Sleep apnea     USES C PAP  . Bilateral hip pain    Past Surgical History  Procedure Laterality Date  . Total knee replacment      bilat  . Cholecystectomy  1989  . Trigger finger repair      also lue, cts surgically repaired  . Renal calculi    . Tonsillectomy    . Lithotripsy    . Arthroscopy knee w/ drilling    . Breath tek h pylori N/A 12/23/2012    Procedure: BREATH TEK H PYLORI;  Surgeon: Gayland Curry, MD;  Location: [facility];  Service: General;  Laterality: N/A;  . Wisdom tooth extraction    . Laparoscopic gastric banding with hiatal hernia repair N/A 04/08/2013    Procedure: LAPAROSCOPIC GASTRIC BANDING WITH possible  HIATAL  HERNIA REPAIR;  Surgeon: Gayland Curry, MD;  Location: [facility];  Service: General;  Laterality: N/A;   Family History  Problem Relation Age of Onset  . Depression Mother   . Hypertension Mother   . Heart disease Father     MI  . Depression Brother   . Stroke Paternal Aunt     CVA  . Diabetes Paternal Uncle   . Heart disease Paternal Uncle     MI  . Heart disease Paternal Grandmother     MI  . Diabetes Cousin     PATERNAL   History  Substance Use Topics  . Smoking status: Former Smoker    Types: Cigarettes    Quit date: 08/30/1974  . Smokeless tobacco: Not on file  . Alcohol Use: Yes     Comment: Occasionally; 1 small drink a week    Review of Systems  All systems reviewed and negative, other than as noted in HPI.   Allergies  Propoxyphene n-acetaminophen; Codeine; Morphine; and Penicillins  Home Medications   Prior to Admission medications   Medication Sig Start Date End Date Taking? Authorizing Provider  allopurinol (ZYLOPRIM) 300 MG tablet Take 300 mg by mouth daily with breakfast.  Historical Provider, MD  amLODipine-benazepril (LOTREL) 5-10 MG per capsule Take 1 capsule by mouth daily.    Historical Provider, MD  aspirin 325 MG tablet Take 325 mg by mouth daily.    Historical Provider, MD  calcium gluconate 500 MG tablet Take 1 tablet by mouth 3 (three) times daily.    Historical Provider, MD  cholecalciferol (VITAMIN D) 1000 UNITS tablet Take 2,000 Units by mouth daily.    Historical Provider, MD  finasteride (PROSCAR) 5 MG tablet Take 5 mg by mouth daily.     Historical Provider, MD  Flaxseed, Linseed, (FLAX SEED OIL) 1300 MG CAPS Take 1,300 mg by mouth daily.    Historical Provider, MD  furosemide (LASIX) 20 MG tablet Take 1 tablet (20 mg total) by mouth daily. 07/13/13   Wandra Arthurs, MD  gabapentin (NEURONTIN) 100 MG capsule Take 1 capsule (100 mg total) by mouth 2 (two) times daily. 05/06/13   Hendricks Limes, MD  Glucosamine-Chondroit-Vit C-Mn (GLUCOSAMINE  1500 COMPLEX PO) Take 1 tablet by mouth daily.    Historical Provider, MD  LORazepam (ATIVAN) 0.5 MG tablet Take 0.5-1 mg by mouth 2 (two) times daily. 0.5 mg in the morning and 1 mg at bedtime    Historical Provider, MD  Multiple Vitamin (MULTIVITAMIN) tablet Take 1 tablet by mouth daily.     Historical Provider, MD  pravastatin (PRAVACHOL) 40 MG tablet Take 40 mg by mouth at bedtime.    Historical Provider, MD  risperiDONE (RISPERDAL) 0.5 MG tablet Take 1 tablet (0.5 mg total) by mouth at bedtime. 06/03/13   Kathlee Nations, MD  Rivaroxaban (XARELTO) 15 MG TABS tablet Take 1 tablet (15 mg total) by mouth 2 (two) times daily with a meal. 07/15/13   Hendricks Limes, MD  sertraline (ZOLOFT) 100 MG tablet Take 1.5 tablets (150 mg total) by mouth daily with breakfast. 06/03/13   Kathlee Nations, MD  traMADol (ULTRAM) 50 MG tablet Take 1 tablet (50 mg total) by mouth every 6 (six) hours as needed. 07/13/13   Wandra Arthurs, MD  vitamin B-12 (CYANOCOBALAMIN) 1000 MCG tablet Take 1,000 mcg by mouth daily.    Historical Provider, MD   BP 115/60  Pulse 79  Temp(Src) 99.1 F (37.3 C) (Oral)  Resp 17  Ht 6' (1.829 m)  Wt 315 lb (142.883 kg)  BMI 42.71 kg/m2  SpO2 93% Physical Exam  Nursing note and vitals reviewed. Constitutional: He appears well-developed and well-nourished. No distress.  Laying in bed. NAD. Obese.   HENT:  Head: Normocephalic and atraumatic.  Eyes: Conjunctivae are normal. Right eye exhibits no discharge. Left eye exhibits no discharge.  Neck: Neck supple.  Cardiovascular: Normal rate, regular rhythm and normal heart sounds.  Exam reveals no gallop and no friction rub.   No murmur heard. Pulmonary/Chest: Effort normal and breath sounds normal. No respiratory distress.  Reports increase CP with movement in bed, but not on palpation.   Abdominal: Soft. He exhibits no distension. There is no tenderness.  Musculoskeletal: He exhibits no edema and no tenderness.  Lower extremities symmetric  as compared to each other. No calf tenderness. Negative Homan's. No palpable cords.   Neurological: He is alert.  Skin: Skin is warm and dry. He is not diaphoretic.  Psychiatric: He has a normal mood and affect. His behavior is normal. Thought content normal.    ED Course  Procedures (including critical care time) Labs Review Labs Reviewed  CBC - Abnormal; Notable for the  following:    WBC 13.8 (*)    RBC 4.03 (*)    Hemoglobin 12.2 (*)    HCT 36.6 (*)    All other components within normal limits  BASIC METABOLIC PANEL - Abnormal; Notable for the following:    Glucose, Bld 105 (*)    BUN 38 (*)    GFR calc non Af Amer 53 (*)    GFR calc Af Amer 61 (*)    All other components within normal limits  ANTITHROMBIN III  PROTEIN C ACTIVITY  PROTEIN C, TOTAL  PROTEIN S ACTIVITY  PROTEIN S, TOTAL  LUPUS ANTICOAGULANT PANEL  BETA-2-GLYCOPROTEIN I ABS, IGG/M/A  HOMOCYSTEINE  FACTOR 5 LEIDEN  PROTHROMBIN GENE MUTATION  CARDIOLIPIN ANTIBODIES, IGG, IGM, IGA  I-STAT TROPOININ, ED    Imaging Review Dg Chest 2 View  07/15/2013   CLINICAL DATA:  Dyspnea and chest pain.  EXAM: CHEST  2 VIEW  COMPARISON:  07/13/2013 and 09/26/2012  FINDINGS: Lungs are adequately inflated with continued evidence of a small left pleural effusion with possible slight interval improvement. There is mild stable cardiomegaly. Remainder the exam is unchanged.  IMPRESSION: Continued small left pleural effusion with possible slight interval improvement.   Electronically Signed   By: Marin Olp M.D.   On: 07/15/2013 11:32   Ct Angio Chest Pe W/cm &/or Wo Cm  07/16/2013   CLINICAL DATA:  Shortness of breath, neck/back pain, elevated D-dimer, evaluate for PE. Lap band surgery in January.  EXAM: CT ANGIOGRAPHY CHEST WITH CONTRAST  TECHNIQUE: Multidetector CT imaging of the chest was performed using the standard protocol during bolus administration of intravenous contrast. Multiplanar CT image reconstructions and MIPs  were obtained to evaluate the vascular anatomy.  CONTRAST:  43mL OMNIPAQUE IOHEXOL 350 MG/ML SOLN  COMPARISON:  None.  FINDINGS: Tiny subsegmental filling defects within branches of the right lower lobe pulmonary artery (series 5/ images 169, 170, and 189), suspicious for pulmonary emboli. Overall clot burden is small. No findings to suggest right heart strain (RV-to-LV ratio 0.87).  Small to moderate left and small right pleural effusions. No frank interstitial edema. No pneumothorax.  Visualized right thyroid is notable for a suspected 2.2 cm thyroid nodule (series 4/ image 1), incompletely visualized.  The heart is top-normal in size. Small to moderate pericardial effusion with a mildly irregular appearance. Mild atherosclerotic calcifications of the aortic arch.  No suspicious mediastinal, hilar, or axillary lymphadenopathy.  Visualized upper abdomen is notable for a laparoscopic band in satisfactory position.  Degenerative changes of the visualized thoracolumbar spine.  Review of the MIP images confirms the above findings.  IMPRESSION: Suspected tiny subsegmental pulmonary emboli within branches of the right lower lobe pulmonary artery. Overall clot burden is small.  Small to moderate left and small right pleural effusions. No frank interstitial edema.  Small to moderate pericardial effusion. Given the irregular appearance, correlate for pericarditis.  Critical Value/emergent results were called by telephone at the time of interpretation on 07/16/2013 at 5:28 PM to Dr. Gwyndolyn Saxon HOPPER, who verbally acknowledged these results.   Electronically Signed   By: Julian Hy M.D.   On: 07/16/2013 17:29     EKG Interpretation   Date/Time:  Wednesday July 16 2013 19:44:59 EDT Ventricular Rate:  77 PR Interval:  142 QRS Duration: 94 QT Interval:  397 QTC Calculation: 449 R Axis:   67 Text Interpretation:  Sinus rhythm Nonspecific T abnormalities, anterior  leads ED PHYSICIAN INTERPRETATION AVAILABLE IN  [facility] Confirmed  by  TEST, Record ([id number]) on 07/18/2013 7:06:33 AM      MDM   Final diagnoses:  Pericarditis  Pericardial effusion  Pulmonary embolism    68 year old male with pleuritic chest pain with radiation to his back. CT earlier today shows evidence of both a small to moderate pericardial effusion as well as likely subsegmental pulmonary embolus.  Pt reports Dr [name] ordered blood work, but not sure of specifics. Hypercoagulable panel ordered today but interpretation some components may be difficult to interpret with pt having had two doses of xarelto. Clinically suspect that pain and effusion is from pericarditis.  Reports viral prodrome ( "really bad flu twice" ) fairly recently leading up to current symptoms.     Virgel Manifold, MD 07/21/13 [phone]

## 2013-07-16 NOTE — ED Notes (Signed)
Pt states he was sent from cardiologist office for PE and pericarditis.  Reports sob, generalized weakness, and sharp chest pain. Pt wants to make sure EDP reads note in chart from Dr. [name] (PCP).  Pt arrived with 20g SL in R FA.

## 2013-07-17 DIAGNOSIS — K759 Inflammatory liver disease, unspecified: Secondary | ICD-10-CM | POA: Diagnosis present

## 2013-07-17 DIAGNOSIS — I2699 Other pulmonary embolism without acute cor pulmonale: Secondary | ICD-10-CM

## 2013-07-17 DIAGNOSIS — I4891 Unspecified atrial fibrillation: Secondary | ICD-10-CM | POA: Diagnosis present

## 2013-07-17 LAB — LUPUS ANTICOAGULANT PANEL
DRVVT: 74.7 secs — ABNORMAL HIGH (ref <42.9)
Drvvt confirmation: 0.69 Ratio (ref <1.11)
Lupus Anticoagulant: NOT DETECTED
PTT Lupus Anticoagulant: 38.9 secs (ref 28.0–43.0)
dRVVT Incubated 1:1 Mix: 48.1 secs — ABNORMAL HIGH (ref <42.9)

## 2013-07-17 LAB — RHEUMATOID FACTOR: RHEUMATOID FACTOR: 16 [IU]/mL — AB (ref <=14)

## 2013-07-17 LAB — PROTEIN S ACTIVITY: Protein S Activity: 92 % (ref 69–129)

## 2013-07-17 LAB — PROTEIN C ACTIVITY: Protein C Activity: 53 % — ABNORMAL LOW (ref 75–133)

## 2013-07-17 LAB — HOMOCYSTEINE: Homocysteine: 11.6 umol/L (ref 4.0–15.4)

## 2013-07-17 LAB — TROPONIN I: Troponin I: <0.3 ng/mL (ref <0.30)

## 2013-07-17 LAB — PROTHROMBIN GENE MUTATION

## 2013-07-17 MED ORDER — METOPROLOL TARTRATE 1 MG/ML IV SOLN
5.0000 mg | Freq: Once | INTRAVENOUS | Status: AC
  Administered 2013-07-17: 5 mg via INTRAVENOUS

## 2013-07-17 MED ORDER — DILTIAZEM LOAD VIA INFUSION: 10.0000 mg | Freq: Once | INTRAVENOUS | Status: DC

## 2013-07-17 MED ORDER — METOPROLOL TARTRATE 1 MG/ML IV SOLN
INTRAVENOUS | Status: AC
  Filled 2013-07-17: qty 5

## 2013-07-17 MED ORDER — DILTIAZEM HCL 100 MG IV SOLR
5.0000 mg/h | INTRAVENOUS | Status: DC
  Administered 2013-07-17 – 2013-07-18 (×2): 5 mg/h via INTRAVENOUS
  Administered 2013-07-18: 13:00:00 via INTRAVENOUS
  Administered 2013-07-19: 5 mg/h via INTRAVENOUS
  Filled 2013-07-17: qty 100

## 2013-07-17 MED ORDER — HYDROMORPHONE HCL PF 1 MG/ML IJ SOLN: 1.0000 mg | INTRAMUSCULAR | Status: DC | PRN

## 2013-07-17 NOTE — Progress Notes (Signed)
Placed pt. On cpap as per order. Pt. Is tolerating well at this time.

## 2013-07-17 NOTE — Progress Notes (Signed)
Patient arrived to unit via strectcher.Alert and oriented x4.Patient oriented to unit and call bell system.No acute distress noted at present time.

## 2013-07-17 NOTE — Consult Note (Signed)
CONSULTATION NOTE  Reason for Consult: Pericarditis  Requesting Physician: Karleen Hampshire  Cardiologist: None (New)  HPI: This is a 68 y.o. male with a past medical history significant for bariatric surgery with recent 40 lb weight loss, HTN, obesity, OSA, gout, depression and BPH. He had a recent severe viral illness about 3 months ago. He was recently seen with persistent cough, wheezing and dyspnea and there was chest pain concerning for possible PE. He was anticoagulated and given lasix. He had recurrent symptoms and came to the ER. A CTPA was performed which showed PE, pericardial and pleural effusions.  An echocardiogram was performed today which shows a preserved EF of 55-60%.  There is a moderate to large pericardial effusion noted, without signs of hemodynamic compromise.  There is moderate to severe LAE. BNP is elevated to 519. There is leukocytosis at 13.8.  He is noted to be in atrial fibrillation with fairly controlled ventricular response. We are asked to consult given acute findings.    PMHx:  Past Medical History  Diagnosis Date  . Benign prostatic hypertrophy   . Hypertension   . Gout   . Depression   . FH: colonic polyps   . Morbid obesity   . Complication of anesthesia     MORPHINE CAUSES HALLUCINATIONS  . Asthma     childhood asthma  . Numbness     feet / legs  . Cancer   . History of kidney stones   . Kidney cysts   . Sleep apnea     USES C PAP  . Bilateral hip pain    Past Surgical History  Procedure Laterality Date  . Total knee replacment      bilat  . Cholecystectomy  1989  . Trigger finger repair      also lue, cts surgically repaired  . Renal calculi    . Tonsillectomy    . Lithotripsy    . Arthroscopy knee w/ drilling    . Breath tek h pylori N/A 12/23/2012    Procedure: BREATH TEK H PYLORI;  Surgeon: Gayland Curry, MD;  Location: [facility];  Service: General;  Laterality: N/A;  . Wisdom tooth extraction    . Laparoscopic gastric banding  with hiatal hernia repair N/A 04/08/2013    Procedure: LAPAROSCOPIC GASTRIC BANDING WITH possible  HIATAL HERNIA REPAIR;  Surgeon: Gayland Curry, MD;  Location: [facility];  Service: General;  Laterality: N/A;    FAMHx: Family History  Problem Relation Age of Onset  . Depression Mother   . Hypertension Mother   . Heart disease Father     MI  . Depression Brother   . Stroke Paternal Aunt     CVA  . Diabetes Paternal Uncle   . Heart disease Paternal Uncle     MI  . Heart disease Paternal Grandmother     MI  . Diabetes Cousin     PATERNAL    SOCHx:  reports that he quit smoking about 38 years ago. His smoking use included Cigarettes. He smoked 0.00 packs per day. He does not have any smokeless tobacco history on file. He reports that he drinks alcohol. He reports that he does not use illicit drugs.  ALLERGIES: Allergies  Allergen Reactions  . Propoxyphene N-Acetaminophen Nausea Only  . Codeine     nausea  . Morphine     Nausea Severe hallucinations  . Penicillins     LOC with shot    ROS: A comprehensive review of  systems was negative except for: Respiratory: positive for dyspnea on exertion Cardiovascular: positive for chest pain, fatigue and lower extremity edema  HOME MEDICATIONS: Prescriptions prior to admission  Medication Sig Dispense Refill  . allopurinol (ZYLOPRIM) 300 MG tablet Take 300 mg by mouth daily with breakfast.       . amLODipine-benazepril (LOTREL) 5-10 MG per capsule Take 1 capsule by mouth daily.      . calcium gluconate 500 MG tablet Take 1 tablet by mouth 3 (three) times daily.      . Cyanocobalamin (VITAMIN B-12) 1000 MCG SUBL Place 1,000 mcg under the tongue every morning.      . finasteride (PROSCAR) 5 MG tablet Take 5 mg by mouth daily.       . furosemide (LASIX) 20 MG tablet Take 1 tablet (20 mg total) by mouth daily.  5 tablet  0  . gabapentin (NEURONTIN) 100 MG capsule Take 1 capsule (100 mg total) by mouth 2 (two) times daily.  60 capsule  2    . Glucosamine-Chondroit-Vit C-Mn (GLUCOSAMINE 1500 COMPLEX PO) Take 1,500 mg by mouth daily.      . Loperamide-Simethicone (IMODIUM MULTI-SYMPTOM RELIEF) 2-125 MG TABS Take 1-2 tablets by mouth 2 (two) times daily as needed (diarrhea).      . LORazepam (ATIVAN) 0.5 MG tablet Take 0.5-1 mg by mouth 2 (two) times daily. 0.5 mg in the morning and 1 mg at bedtime      . Multiple Vitamin (MULTIVITAMIN) tablet Take 1 tablet by mouth daily. Chewable tablet with iron      . ondansetron (ZOFRAN) 4 MG tablet Take 4 mg by mouth every 8 (eight) hours as needed for nausea or vomiting.      . risperiDONE (RISPERDAL) 0.5 MG tablet Take 1 tablet (0.5 mg total) by mouth at bedtime.  30 tablet  2  . Rivaroxaban (XARELTO) 15 MG TABS tablet Take 1 tablet (15 mg total) by mouth 2 (two) times daily with a meal.  14 tablet  0  . sertraline (ZOLOFT) 100 MG tablet Take 1.5 tablets (150 mg total) by mouth daily with breakfast.  45 tablet  2  . Tetrahydrozoline HCl (VISINE OP) Place 2 drops into both eyes daily as needed (dry eyes).      . traMADol (ULTRAM) 50 MG tablet Take 25 mg by mouth every 6 (six) hours as needed for moderate pain.      [name] aspirin 325 MG tablet Take 325 mg by mouth daily.      . Flaxseed, Linseed, (FLAX SEED OIL) 1300 MG CAPS Take 1,300 mg by mouth daily.      . pravastatin (PRAVACHOL) 40 MG tablet Take 40 mg by mouth at bedtime.        HOSPITAL MEDICATIONS: Scheduled: . aspirin  325 mg Oral Daily  . calcium carbonate  250 mg Oral TID WC  . finasteride  5 mg Oral Daily  . furosemide  40 mg Intravenous Daily  . gabapentin  100 mg Oral BID  . LORazepam  0.5 mg Oral Daily  . LORazepam  1 mg Oral QHS  . metoprolol      . risperiDONE  0.5 mg Oral QHS  . sertraline  150 mg Oral Q breakfast  . vitamin B-12  1,000 mcg Oral Daily    VITALS: Blood pressure 101/71, pulse 113, temperature 98.2 F (36.8 C), temperature source Oral, resp. rate 16, height 6' (1.829 m), weight 303 lb 12.8 oz (137.803  kg), SpO2 92.00%.  PHYSICAL  EXAM: General appearance: alert, no distress and morbidly obese Neck: no carotid bruit and no JVD Lungs: diminished breath sounds bilaterally Heart: regular rate and rhythm Abdomen: morbidly obese Extremities: extremities normal, atraumatic, no cyanosis or edema Pulses: 2+ and symmetric Skin: pale, warm, dry Neurologic: Grossly normal Psych: Normal mood, non-anxious  LABS: Results for orders placed during the hospital encounter of 07/16/13 (from the past 48 hour(s))  CBC     Status: Abnormal   Collection Time    07/16/13  7:19 PM      Result Value Ref Range   WBC 13.8 (*) 4.0 - 10.5 K/uL   RBC 4.03 (*) 4.22 - 5.81 MIL/uL   Hemoglobin 12.2 (*) 13.0 - 17.0 g/dL   HCT 36.6 (*) 39.0 - 52.0 %   MCV 90.8  78.0 - 100.0 fL   MCH 30.3  26.0 - 34.0 pg   MCHC 33.3  30.0 - 36.0 g/dL   RDW 14.3  11.5 - 15.5 %   Platelets 340  150 - 400 K/uL  BASIC METABOLIC PANEL     Status: Abnormal   Collection Time    07/16/13  7:19 PM      Result Value Ref Range   Sodium 140  137 - 147 mEq/L   Potassium 3.8  3.7 - 5.3 mEq/L   Chloride 100  96 - 112 mEq/L   CO2 21  19 - 32 mEq/L   Glucose, Bld 105 (*) 70 - 99 mg/dL   BUN 38 (*) 6 - 23 mg/dL   Creatinine, Ser 1.35  0.50 - 1.35 mg/dL   Calcium 8.6  8.4 - 10.5 mg/dL   GFR calc non Af Amer 53 (*) >90 mL/min   GFR calc Af Amer 61 (*) >90 mL/min   Comment: (NOTE)     The eGFR has been calculated using the CKD EPI equation.     This calculation has not been validated in all clinical situations.     eGFR's persistently <90 mL/min signify possible Chronic Kidney     Disease.  Randolm Idol, ED     Status: None   Collection Time    07/16/13  7:27 PM      Result Value Ref Range   Troponin i, poc 0.01  0.00 - 0.08 ng/mL   Comment 3            Comment: Due to the release kinetics of cTnI,     a negative result within the first hours     of the onset of symptoms does not rule out     myocardial infarction with  certainty.     If myocardial infarction is still suspected,     repeat the test at appropriate intervals.  ANTITHROMBIN III     Status: None   Collection Time    07/16/13  7:51 PM      Result Value Ref Range   AntiThromb III Func 96  75 - 120 %  PROTEIN C ACTIVITY     Status: Abnormal   Collection Time    07/16/13  7:51 PM      Result Value Ref Range   Protein C Activity 53 (*) 75 - 133 %   Comment: Performed at [place]     Status: None   Collection Time    07/16/13  7:51 PM      Result Value Ref Range   Protein S Activity 92  69 - 129 %  Comment: Performed at [facility]     Status: Abnormal   Collection Time    07/16/13  7:51 PM      Result Value Ref Range   PTT Lupus Anticoagulant 38.9  28.0 - 43.0 secs   PTTLA Confirmation NOT APPL  <8.0 secs   PTTLA 4:1 Mix NOT APPL  28.0 - 43.0 secs   Drvvt 74.7 (*) <42.9 secs   Drvvt confirmation 0.69  <1.11 Ratio   dRVVT Incubated 1:1 Mix 48.1 (*) <42.9 secs   Lupus Anticoagulant NOT DETECTED  NOT DETECTED   Comment: Performed at [facility]  BETA-2-GLYCOPROTEIN I ABS, IGG/M/A     Status: None   Collection Time    07/16/13  7:51 PM      Result Value Ref Range   Beta-2 Glyco I IgG 4  <20 G Units   Beta-2-Glycoprotein I IgM 5  <20 M Units   Beta-2-Glycoprotein I IgA 9  <20 A Units   Comment: Performed at [facility]  HOMOCYSTEINE     Status: None   Collection Time    07/16/13  7:51 PM      Result Value Ref Range   Homocysteine 11.6  4.0 - 15.4 umol/L   Comment: Performed at [place]     Status: None   Collection Time    07/16/13  7:51 PM      Result Value Ref Range   Recommendations-PTGENE: (NOTE)     Comment: **  NEGATIVE FOR PROTHROMBIN II GENE MUTATION  **     Reference Interval:     Negative for Prothrombin II Gene Mutation     Interpretation:     Prothrombin II 20210A Gene Mutation is a  genetic risk factor resulting     in an increase risk for venous thrombosis (five-fold); Myocardial     infarction (two-fold); Cerebrovascular ischemic disease in young     patients (especially females using oral contraceptives) and pulmonary     embolism.     Both heterozygous and homozygous carriers are at increased risk     although homozygosity is rare.  The incidence of heterozygous carriers     of this mutation is estimated to be 1 to 2.5% for individuals of     European and African descent.     DNA from this patient was tested using a FDA approved assay for            Factor II Prothrombin Mutation.          Performed at [place], IGG, IGM, IGA     Status: Abnormal (Preliminary result)   Collection Time    07/16/13  7:51 PM      Result Value Ref Range   Anticardiolipin IgG PENDING  >11 GPL   Anticardiolipin IgM 0 (*) <11 MPL U/mL   Anticardiolipin IgA 11 (*) <22 APL U/mL   Comment: (NOTE)     Reference Range:  Cardiolipin IgG       Normal                  <23       Low Positive (+)        23-35       Moderate Positive (+)   36-50       High Positive (+)       >50     Reference Range:  Cardiolipin  IgM       Normal                  <11       Low Positive (+)        11-20       Moderate Positive (+)   21-30       High Positive (+)       >30     Reference Range:  Cardiolipin IgA       Normal                  <22       Low Positive (+)        22-35       Moderate Positive (+)   36-45       High Positive (+)       >45     Performed at [facility]  RHEUMATOID FACTOR     Status: Abnormal   Collection Time    07/17/13  3:25 AM      Result Value Ref Range   Rheumatoid Factor 16 (*) <=14 IU/mL   Comment: (NOTE)                             Interpretive Table                        Low Positive: 15 - 41 IU/mL                        High Positive:  >= 42 IU/mL     In addition to the RF result, and clinical symptoms including joint      involvement, the 2010 ACR Classification Criteria for     scoring/diagnosing Rheumatoid Arthritis include the results of the     following tests:  CRP (27035), ESR (15010), and CCP (APCA) (00938).     [email or web address].org/practice/clinical/classification/ra/ra_2010.asp     Performed at [facility]: Ct Angio Chest Pe W/cm &/or Wo Cm  07/16/2013   CLINICAL DATA:  Shortness of breath, neck/back pain, elevated D-dimer, evaluate for PE. Lap band surgery in January.  EXAM: CT ANGIOGRAPHY CHEST WITH CONTRAST  TECHNIQUE: Multidetector CT imaging of the chest was performed using the standard protocol during bolus administration of intravenous contrast. Multiplanar CT image reconstructions and MIPs were obtained to evaluate the vascular anatomy.  CONTRAST:  9m OMNIPAQUE IOHEXOL 350 MG/ML SOLN  COMPARISON:  None.  FINDINGS: Tiny subsegmental filling defects within branches of the right lower lobe pulmonary artery (series 5/ images 169, 170, and 189), suspicious for pulmonary emboli. Overall clot burden is small. No findings to suggest right heart strain (RV-to-LV ratio 0.87).  Small to moderate left and small right pleural effusions. No frank interstitial edema. No pneumothorax.  Visualized right thyroid is notable for a suspected 2.2 cm thyroid nodule (series 4/ image 1), incompletely visualized.  The heart is top-normal in size. Small to moderate pericardial effusion with a mildly irregular appearance. Mild atherosclerotic calcifications of the aortic arch.  No suspicious mediastinal, hilar, or axillary lymphadenopathy.  Visualized upper abdomen is notable for a laparoscopic band in satisfactory position.  Degenerative changes of the visualized thoracolumbar spine.  Review of the MIP images confirms the above findings.  IMPRESSION: Suspected tiny subsegmental pulmonary emboli within branches of the right lower lobe pulmonary artery. Overall clot  burden is small.  Small to moderate left and  small right pleural effusions. No frank interstitial edema.  Small to moderate pericardial effusion. Given the irregular appearance, correlate for pericarditis.  Critical Value/emergent results were called by telephone at the time of interpretation on 07/16/2013 at 5:28 PM to Dr. Gwyndolyn Saxon HOPPER, who verbally acknowledged these results.   Electronically Signed   By: Julian Hy M.D.   On: 07/16/2013 17:29    HOSPITAL DIAGNOSES: Principal Problem:   CHF (congestive heart failure) Active Problems:   HYPERLIPIDEMIA   HTN (hypertension)   SLEEP APNEA   Morbid obesity   Morbid obesity with BMI of 45.0-49.9, adult   H/O laparoscopic adjustable gastric banding & hiatal hernia repair 04/08/13   Pericardial effusion   Pericarditis   Post viral syndrome   Pulmonary embolism   Hepatitis   Atrial fibrillation   IMPRESSION: 1. Moderate to large pericardial effusion, without tamponade physiology 2. New onset atrial fibrillation 3. Pulmonary embolus 4. Hepatitis  RECOMMENDATION: 1. Mr. Lamboy has an unusual presentation with multiple medical problems, including pulmonary embolus, moderate to large pericardial effusion and new onset a-fib. I suspect the a-fib is due to the combination of PE, severely dilated LA and pericardial effusion. The etiology of the effusion is unclear - possibilities include infectious (viral/bacterial), idiopathic, malignant, autoimmune or other causes. He may have had a preceding viral syndrome. Certainly he has abnormal liver enzymes, elevated BNP and leukocytosis which are abnormal as well.  Venous dopplers are negative for DVT. 2.   I agree with transfer to [facility] for closer management. He has reasonable rate control on his current      medications for a-fib.  3.  Would recommend switching to IV heparin for ease of reversal and onset/offset if surgery is necessary. 4.  Would consult CT surgery in the am for evaluation of pericardial window. 5.  I have stopped some of his  potentially hepatotoxic medications in light of his liver enzyme elevation,      particularly his statin. 6.  Monitor BP closely and have a low threshold for pericardiocentesis if s/s of hemodynamic compromise      become present.  Thank you for this interesting consult. We will follow closely along with you.  Time Spent Directly with Patient: 60 minutes  Pixie Casino, MD, [facility] Attending Cardiologist [place] 07/17/2013, 8:02 PM

## 2013-07-17 NOTE — Progress Notes (Signed)
Pt arrived to floor via stretcher. Pt was oriented to room & call bell system. VS have been taken. Pt in no apparent distress at this time. Safety video has been watched.

## 2013-07-17 NOTE — Discharge Instructions (Addendum)
Information on my medicine - XARELTO (rivaroxaban)  This medication education was reviewed with me or my healthcare representative as part of my discharge preparation.  The pharmacist that spoke with me during my hospital stay was:  Jaylenne Hamelin, [place]? Xarelto was prescribed to treat blood clots that may have been found in the veins of your legs (deep vein thrombosis) or in your lungs (pulmonary embolism) and to reduce the risk of them occurring again.  What do you need to know about Xarelto? The starting dose is one 15 mg tablet taken TWICE daily with food for the FIRST 21 DAYS then on (enter date)  08/06/2013  the dose is changed to one 20 mg tablet taken ONCE A DAY with your evening meal.  DO NOT stop taking Xarelto without talking to the health care provider who prescribed the medication.  Refill your prescription for 20 mg tablets before you run out.  After discharge, you should have regular check-up appointments with your healthcare provider that is prescribing your Xarelto.  In the future your dose may need to be changed if your kidney function changes by a significant amount.  What do you do if you miss a dose? If you are taking Xarelto TWICE DAILY and you miss a dose, take it as soon as you remember. You may take two 15 mg tablets (total 30 mg) at the same time then resume your regularly scheduled 15 mg twice daily the next day.  If you are taking Xarelto ONCE DAILY and you miss a dose, take it as soon as you remember on the same day then continue your regularly scheduled once daily regimen the next day. Do not take two doses of Xarelto at the same time.   Important Safety Information Xarelto is a blood thinner medicine that can cause bleeding. You should call your healthcare provider right away if you experience any of the following:   Bleeding from an injury or your nose that does not stop.   Unusual colored urine (red or dark brown) or  unusual colored stools (red or black).   Unusual bruising for unknown reasons.   A serious fall or if you hit your head (even if there is no bleeding).  Some medicines may interact with Xarelto and might increase your risk of bleeding while on Xarelto. To help avoid this, consult your healthcare provider or pharmacist prior to using any new prescription or non-prescription medications, including herbals, vitamins, non-steroidal anti-inflammatory drugs (NSAIDs) and supplements.  This website has more information on Xarelto: [email or web address]-benson.[email or web address]

## 2013-07-17 NOTE — H&P (Signed)
[facility] History and Physical  LUE DUBUQUE WNI:[id number] DOB: 1946-03-25    PCP:   [name], MD   Chief Complaint: chest pain and shortness of breath.  HPI: [name] is an 68 y.o. male with hx of bariatric surgery one year ago, lost about 85 lbs total before and after surgery, hx of HTN, obesity and sleep apnea, gout, depression, BPH, noted that he had a severe "viral illness" about 3 months ago, followed by a persistent but no productive cough, wheezing and shortness of breath.  He was seen at the urgent care, Dx with URI, and given symptomatic Tx.  He saw his PCP, who was concerned about a PE, and thus started him on Xarelto and Lasix, and he presented to the ER today with same symptoms.  He said his chest pain was with movement, better with lying down and with palpation. He has no orthopnea or PND.  He denied fever, chills, nausea, vomiting or diaphoresis.  Evaluation in the ER showed EKG with no ST elevation, in NSR, with S1Q3 and reverse T wave in the precordial leads.  A CTPA was done, and it showed a small clot burden acute pulmonary edema, with pericardial effusion along with bilateral pleural effusion.  He denied rash, joint pain, family hx of rhematoid arthritis, or lupus.  Hospitalsit was aksed to admit him for PE and pericarditis.   Rewiew of Systems:  Constitutional: Negative for malaise, fever and chills. No significant weight loss or weight gain Eyes: Negative for eye pain, redness and discharge, diplopia, visual changes, or flashes of light. ENMT: Negative for ear pain, hoarseness, nasal congestion, sinus pressure and sore throat. No headaches; tinnitus, drooling, or problem swallowing. Cardiovascular: Negative for  palpitations, diaphoresis, and peripheral edema. ; No orthopnea, PND Respiratory: Negative for cough, hemoptysis, wheezing and stridor. No pleuritic chestpain. Gastrointestinal: Negative for nausea, vomiting, diarrhea, constipation,  abdominal pain, melena, blood in stool, hematemesis, jaundice and rectal bleeding.    Genitourinary: Negative for frequency, dysuria, incontinence,flank pain and hematuria; Musculoskeletal: Negative for back pain and neck pain. Negative for swelling and trauma.;  Skin: . Negative for pruritus, rash, abrasions, bruising and skin lesion.; ulcerations Neuro: Negative for headache, lightheadedness and neck stiffness. Negative for weakness, altered level of consciousness , altered mental status, extremity weakness, burning feet, involuntary movement, seizure and syncope.  Psych: negative for anxiety, depression, insomnia, tearfulness, panic attacks, hallucinations, paranoia, suicidal or homicidal ideation   Past Medical History  Diagnosis Date  . Benign prostatic hypertrophy   . Hypertension   . Gout   . Depression   . FH: colonic polyps   . Morbid obesity   . Complication of anesthesia     MORPHINE CAUSES HALLUCINATIONS  . Asthma     childhood asthma  . Numbness     feet / legs  . Cancer   . History of kidney stones   . Kidney cysts   . Sleep apnea     USES C PAP  . Bilateral hip pain     Past Surgical History  Procedure Laterality Date  . Total knee replacment      bilat  . Cholecystectomy  1989  . Trigger finger repair      also lue, cts surgically repaired  . Renal calculi    . Tonsillectomy    . Lithotripsy    . Arthroscopy knee w/ drilling    . Breath tek h pylori N/A 12/23/2012    Procedure: BREATH TEK H PYLORI;  Surgeon: Gayland Curry, MD;  Location: [facility];  Service: General;  Laterality: N/A;  . Wisdom tooth extraction    . Laparoscopic gastric banding with hiatal hernia repair N/A 04/08/2013    Procedure: LAPAROSCOPIC GASTRIC BANDING WITH possible  HIATAL HERNIA REPAIR;  Surgeon: Gayland Curry, MD;  Location: [facility];  Service: General;  Laterality: N/A;    Medications:  HOME MEDS: Prior to Admission medications   Medication Sig Start Date End Date Taking?  Authorizing Provider  allopurinol (ZYLOPRIM) 300 MG tablet Take 300 mg by mouth daily with breakfast.    Yes Historical Provider, MD  amLODipine-benazepril (LOTREL) 5-10 MG per capsule Take 1 capsule by mouth daily.   Yes Historical Provider, MD  calcium gluconate 500 MG tablet Take 1 tablet by mouth 3 (three) times daily.   Yes Historical Provider, MD  Cyanocobalamin (VITAMIN B-12) 1000 MCG SUBL Place 1,000 mcg under the tongue every morning.   Yes Historical Provider, MD  finasteride (PROSCAR) 5 MG tablet Take 5 mg by mouth daily.    Yes Historical Provider, MD  furosemide (LASIX) 20 MG tablet Take 1 tablet (20 mg total) by mouth daily. 07/13/13  Yes Wandra Arthurs, MD  gabapentin (NEURONTIN) 100 MG capsule Take 1 capsule (100 mg total) by mouth 2 (two) times daily. 05/06/13  Yes Hendricks Limes, MD  Glucosamine-Chondroit-Vit C-Mn (GLUCOSAMINE 1500 COMPLEX PO) Take 1,500 mg by mouth daily.   Yes Historical Provider, MD  Loperamide-Simethicone (IMODIUM MULTI-SYMPTOM RELIEF) 2-125 MG TABS Take 1-2 tablets by mouth 2 (two) times daily as needed (diarrhea).   Yes Historical Provider, MD  LORazepam (ATIVAN) 0.5 MG tablet Take 0.5-1 mg by mouth 2 (two) times daily. 0.5 mg in the morning and 1 mg at bedtime   Yes Historical Provider, MD  Multiple Vitamin (MULTIVITAMIN) tablet Take 1 tablet by mouth daily. Chewable tablet with iron   Yes Historical Provider, MD  ondansetron (ZOFRAN) 4 MG tablet Take 4 mg by mouth every 8 (eight) hours as needed for nausea or vomiting.   Yes Historical Provider, MD  risperiDONE (RISPERDAL) 0.5 MG tablet Take 1 tablet (0.5 mg total) by mouth at bedtime. 06/03/13  Yes Kathlee Nations, MD  Rivaroxaban (XARELTO) 15 MG TABS tablet Take 1 tablet (15 mg total) by mouth 2 (two) times daily with a meal. 07/15/13  Yes Hendricks Limes, MD  sertraline (ZOLOFT) 100 MG tablet Take 1.5 tablets (150 mg total) by mouth daily with breakfast. 06/03/13  Yes Kathlee Nations, MD  Tetrahydrozoline HCl (VISINE  OP) Place 2 drops into both eyes daily as needed (dry eyes).   Yes Historical Provider, MD  traMADol (ULTRAM) 50 MG tablet Take 25 mg by mouth every 6 (six) hours as needed for moderate pain.   Yes Historical Provider, MD  aspirin 325 MG tablet Take 325 mg by mouth daily.    Historical Provider, MD  Flaxseed, Linseed, (FLAX SEED OIL) 1300 MG CAPS Take 1,300 mg by mouth daily.    Historical Provider, MD  pravastatin (PRAVACHOL) 40 MG tablet Take 40 mg by mouth at bedtime.    Historical Provider, MD     Allergies:  Allergies  Allergen Reactions  . Propoxyphene N-Acetaminophen Nausea Only  . Codeine     nausea  . Morphine     Nausea Severe hallucinations  . Penicillins     LOC with shot    Social History:   reports that he quit smoking about 38 years ago. His  smoking use included Cigarettes. He smoked 0.00 packs per day. He does not have any smokeless tobacco history on file. He reports that he drinks alcohol. He reports that he does not use illicit drugs.  Family History: Family History  Problem Relation Age of Onset  . Depression Mother   . Hypertension Mother   . Heart disease Father     MI  . Depression Brother   . Stroke Paternal Aunt     CVA  . Diabetes Paternal Uncle   . Heart disease Paternal Uncle     MI  . Heart disease Paternal Grandmother     MI  . Diabetes Cousin     PATERNAL     Physical Exam: Filed Vitals:   07/16/13 2145 07/16/13 2200 07/16/13 2245 07/16/13 2305  BP: 101/53 99/52  121/76  Pulse: 66 67  73  Temp:    98.1 F (36.7 C)  TempSrc:    Oral  Resp: 22 19  16   Height:   6' (1.829 m)   Weight:   137.803 kg (303 lb 12.8 oz)   SpO2: 92% 93%  94%   Blood pressure 121/76, pulse 73, temperature 98.1 F (36.7 C), temperature source Oral, resp. rate 16, height 6' (1.829 m), weight 137.803 kg (303 lb 12.8 oz), SpO2 94.00%.  GEN:  Pleasant patient lying in the stretcher in no acute distress; cooperative with exam. PSYCH:  alert and oriented x4;  does not appear anxious or depressed; affect is appropriate. HEENT: Mucous membranes pink and anicteric; PERRLA; EOM intact; no cervical lymphadenopathy nor thyromegaly or carotid bruit; no JVD; There were no stridor. Neck is very supple. Breasts:: Not examined CHEST WALL: No tenderness CHEST: Normal respiration, clear to auscultation bilaterally. He does have bilateral wheezes. HEART: Regular rate and rhythm.  There are no murmur, rub, or gallops.   BACK: No kyphosis or scoliosis; no CVA tenderness ABDOMEN: soft and non-tender; no masses, no organomegaly, normal abdominal bowel sounds; no pannus; no intertriginous candida. There is no rebound and no distention. Rectal Exam: Not done EXTREMITIES: No bone or joint deformity; age-appropriate arthropathy of the hands and knees; no edema; no ulcerations.  There is no calf tenderness. Genitalia: not examined PULSES: 2+ and symmetric SKIN: Normal hydration no rash or ulceration CNS: Cranial nerves 2-12 grossly intact no focal lateralizing neurologic deficit.  Speech is fluent; uvula elevated with phonation, facial symmetry and tongue midline. DTR are normal bilaterally, cerebella exam is intact, barbinski is negative and strengths are equaled bilaterally.  No sensory loss.   Labs on Admission:  Basic Metabolic Panel:  Recent Labs Lab 07/13/13 1835 07/16/13 1919  NA 137 140  K 3.7 3.8  CL 100 100  CO2 22 21  GLUCOSE 109* 105*  BUN 34* 38*  CREATININE 1.09 1.35  CALCIUM 9.0 8.6   Liver Function Tests:  Recent Labs Lab 07/13/13 1835  AST 95*  ALT 141*  ALKPHOS 314*  BILITOT 1.2  PROT 6.9  ALBUMIN 2.8*   No results found for this basename: LIPASE, AMYLASE,  in the last 168 hours No results found for this basename: AMMONIA,  in the last 168 hours CBC:  Recent Labs Lab 07/13/13 1730 07/16/13 1919  WBC 13.2* 13.8*  NEUTROABS 10.7*  --   HGB 12.6* 12.2*  HCT 36.9* 36.6*  MCV 90.7 90.8  PLT 383 340   Cardiac  Enzymes:  Recent Labs Lab 07/15/13 0854  TROPONINI 0.01    CBG: No results found for this  basename: GLUCAP,  in the last 168 hours   Radiological Exams on Admission: Dg Chest 2 View  07/15/2013   CLINICAL DATA:  Dyspnea and chest pain.  EXAM: CHEST  2 VIEW  COMPARISON:  07/13/2013 and 09/26/2012  FINDINGS: Lungs are adequately inflated with continued evidence of a small left pleural effusion with possible slight interval improvement. There is mild stable cardiomegaly. Remainder the exam is unchanged.  IMPRESSION: Continued small left pleural effusion with possible slight interval improvement.   Electronically Signed   By: Marin Olp M.D.   On: 07/15/2013 11:32   Ct Angio Chest Pe W/cm &/or Wo Cm  07/16/2013   CLINICAL DATA:  Shortness of breath, neck/back pain, elevated D-dimer, evaluate for PE. Lap band surgery in January.  EXAM: CT ANGIOGRAPHY CHEST WITH CONTRAST  TECHNIQUE: Multidetector CT imaging of the chest was performed using the standard protocol during bolus administration of intravenous contrast. Multiplanar CT image reconstructions and MIPs were obtained to evaluate the vascular anatomy.  CONTRAST:  70mL OMNIPAQUE IOHEXOL 350 MG/ML SOLN  COMPARISON:  None.  FINDINGS: Tiny subsegmental filling defects within branches of the right lower lobe pulmonary artery (series 5/ images 169, 170, and 189), suspicious for pulmonary emboli. Overall clot burden is small. No findings to suggest right heart strain (RV-to-LV ratio 0.87).  Small to moderate left and small right pleural effusions. No frank interstitial edema. No pneumothorax.  Visualized right thyroid is notable for a suspected 2.2 cm thyroid nodule (series 4/ image 1), incompletely visualized.  The heart is top-normal in size. Small to moderate pericardial effusion with a mildly irregular appearance. Mild atherosclerotic calcifications of the aortic arch.  No suspicious mediastinal, hilar, or axillary lymphadenopathy.  Visualized upper  abdomen is notable for a laparoscopic band in satisfactory position.  Degenerative changes of the visualized thoracolumbar spine.  Review of the MIP images confirms the above findings.  IMPRESSION: Suspected tiny subsegmental pulmonary emboli within branches of the right lower lobe pulmonary artery. Overall clot burden is small.  Small to moderate left and small right pleural effusions. No frank interstitial edema.  Small to moderate pericardial effusion. Given the irregular appearance, correlate for pericarditis.  Critical Value/emergent results were called by telephone at the time of interpretation on 07/16/2013 at 5:28 PM to Dr. Gwyndolyn Saxon HOPPER, who verbally acknowledged these results.   Electronically Signed   By: Julian Hy M.D.   On: 07/16/2013 17:29    EKG: Independently reviewed. S wave in I, Qs in III, and T wave inversion in III.  No acute ST elevation or PR depression.  Assessment/Plan Present on Admission:  . Pericarditis . Post viral syndrome . Pulmonary embolism . CHF (congestive heart failure) . SLEEP APNEA . HTN (hypertension) . HYPERLIPIDEMIA . Morbid obesity  PLAN:  Will admit this patient for small PE found on CTPA.  He has been on Xarelto and I will continue it.  Hypercoagulable panel was sent by the EDP.  He will need bilateral lower extremitites doppler also.  I don't think he has pericarditis, as his EKG is normal, and he has no rub.  His chest pain was not typical of pericarditis either.  It was positional, and relieved with lying down, not sitting up or leaning forward.  I suspect he had pleural effusions and pericardial effusion from his viral illness he described as severe a month ago.  Will obtain ECHO, ANA, RF, and lupus serology.  I will give him some IV Lasix, and continue his meds.  He is stable, full code, and will be admitted to [facility] service.  For his sleep apnea, will continue with CPAP.  Thank you for asking me to participate in his care.  Other plans as per  orders.  Code Status: FULL CODE.   Orvan Falconer, MD. [facility] Pager [phone] 7pm to 7am.  07/17/2013, 12:05 AM

## 2013-07-17 NOTE — Progress Notes (Signed)
VASCULAR LAB PRELIMINARY  PRELIMINARY  PRELIMINARY  PRELIMINARY  Bilateral lower extremity venous Dopplers completed.    Preliminary report:  There is no DVT or SVT noted in the bilateral lower extremities.  There is sluggish flow noted in the bilateral common femoral veins, etiology unknown.  Iantha Fallen, RVT 07/17/2013, 10:49 AM

## 2013-07-17 NOTE — Progress Notes (Signed)
  Echocardiogram 2D Echocardiogram has been performed.  Carney Corners 07/17/2013, 10:08 AM

## 2013-07-17 NOTE — Progress Notes (Signed)
ANTICOAGULATION CONSULT NOTE - Initial Consult  Pharmacy Consult for Heparin Indication: atrial fibrillation and pulmonary embolus  Allergies  Allergen Reactions  . Propoxyphene N-Acetaminophen Nausea Only  . Codeine     nausea  . Morphine     Nausea Severe hallucinations  . Penicillins     LOC with shot    Patient Measurements: Height: 6' (182.9 cm) Weight: 304 lb 10.8 oz (138.2 kg) IBW/kg (Calculated) : 77.6 Heparin Dosing Weight:   Vital Signs: Temp: 98.5 F (36.9 C) (04/16 2031) Temp src: Oral (04/16 2031) BP: 106/67 mmHg (04/16 2049) Pulse Rate: 134 (04/16 2031)  Labs:  Recent Labs  07/15/13 0854 07/16/13 1919 07/17/13 2000  HGB  --  12.2*  --   HCT  --  36.6*  --   PLT  --  340  --   CREATININE  --  1.35  --   TROPONINI 0.01  --  <0.30    Estimated Creatinine Clearance: 76.5 ml/min (by C-G formula based on Cr of 1.35).   Medical History: Past Medical History  Diagnosis Date  . Benign prostatic hypertrophy   . Hypertension   . Gout   . Depression   . FH: colonic polyps   . Morbid obesity   . Complication of anesthesia     MORPHINE CAUSES HALLUCINATIONS  . Asthma     childhood asthma  . Numbness     feet / legs  . Cancer   . History of kidney stones   . Kidney cysts   . Sleep apnea     USES C PAP  . Bilateral hip pain     Medications:  Scheduled:  . aspirin  325 mg Oral Daily  . calcium carbonate  250 mg Oral TID WC  . finasteride  5 mg Oral Daily  . furosemide  40 mg Intravenous Daily  . gabapentin  100 mg Oral BID  . LORazepam  0.5 mg Oral Daily  . LORazepam  1 mg Oral QHS  . metoprolol      . risperiDONE  0.5 mg Oral QHS  . sertraline  150 mg Oral Q breakfast  . vitamin B-12  1,000 mcg Oral Daily    Assessment: 68yo man s/p Lap Band procedure on Xarelto for (+)PE, now with AFib and pericardial effusion of mod-large size & with possible need for a pericardial window, to switch to Heparin.  His last dose of Xarelto was at [facility]; he is currently on 15mg  po BID with change to 20mg  daily on 5/6.   Heparin level will currently be skewed by presence of Xarelto and will need to be dosed by PTT until Heparin levels and PTTs are correlating, at which point we will transition to Heparin Levels only.  Goal of Therapy:  Heparin level 0.3-0.7 units/ml aPTT 66-102 seconds Monitor platelets by anticoagulation protocol: Yes   Plan:  1-  PTT and Heparin level at 5AM (12hr after last dose of Xarelto)   Gracy Bruins, PharmD Clinical Pharmacist [facility]

## 2013-07-17 NOTE — Progress Notes (Addendum)
[facility] PROGRESS NOTE  [name] FIE:[id number] DOB: 09/12/1945 DOA: 07/16/2013 PCP: [name], MD Interim summary: [name] is an 68 y.o. male with hx of bariatric surgery one year ago, lost about 14 lbs total before and after surgery, hx of HTN, obesity and sleep apnea, gout, depression, BPH, noted that he had a severe "viral illness" about 3 months ago, followed by a persistent but no productive cough, wheezing and shortness of breath. He was seen at the urgent care, Dx with URI, and given symptomatic Tx. He saw his PCP, who was concerned about a PE, and thus started him on Xarelto and Lasix, and he presented to the ER today with same symptoms. He said his chest pain was with movement, better with lying down and with palpation. He has no orthopnea or PND. He denied fever, chills, nausea, vomiting or diaphoresis. Evaluation in the ER showed EKG with no ST elevation, in NSR, with S1Q3 and reverse T wave in the precordial leads. A CTPA was done, and it showed a small clot burden acute pulmonary edema, with pericardial effusion along with bilateral pleural effusion. He denied rash, joint pain, family hx of rhematoid arthritis, or lupus. Hospitalsit was aksed to admit him for PE and pericarditis.   Assessment/Plan: 1. Small subsegmental PE: - Started pt on xarelto by PCP, which will be continued.  - lower extremity dopplers and negative for DVT.  - he is hemodynamically stable and echo ordered to evaluate for right heart strain.   2. Moderate pericardial effusion:  - unclear etiology.  - echo and cardiology consulted for recommendations. No evidence of hemodynamic compromise.  - he is hemodynamically stable and denies chest pain at this time.    3. Leukocytosis:  - probably reactive, he denies fever and no evidence of infection .   Code Status: full code Family Communication: none at bedside Disposition Plan: pending.     Consultants:  cardiology  Procedures:  echocardiogram  Antibiotics:  none  HPI/Subjective: Reports occasional chest pain. Denies any pain now.   Objective: Filed Vitals:   07/17/13 1446  BP: 95/65  Pulse: 90  Temp: 98 F (36.7 C)  Resp: 16    Intake/Output Summary (Last 24 hours) at 07/17/13 1813 Last data filed at 07/17/13 1447  Gross per 24 hour  Intake      0 ml  Output   2100 ml  Net  -2100 ml   Filed Weights   07/16/13 1917 07/16/13 2245  Weight: 142.883 kg (315 lb) 137.803 kg (303 lb 12.8 oz)    Exam:   General:  Alert afebrile comfortable  Cardiovascular: s1s2  Respiratory: ctab  Abdomen: soft NT ND BS+  Musculoskeletal: trace pedal edema  Data Reviewed: Basic Metabolic Panel:  Recent Labs Lab 07/13/13 1835 07/16/13 1919  NA 137 140  K 3.7 3.8  CL 100 100  CO2 22 21  GLUCOSE 109* 105*  BUN 34* 38*  CREATININE 1.09 1.35  CALCIUM 9.0 8.6   Liver Function Tests:  Recent Labs Lab 07/13/13 1835  AST 95*  ALT 141*  ALKPHOS 314*  BILITOT 1.2  PROT 6.9  ALBUMIN 2.8*   No results found for this basename: LIPASE, AMYLASE,  in the last 168 hours No results found for this basename: AMMONIA,  in the last 168 hours CBC:  Recent Labs Lab 07/13/13 1730 07/16/13 1919  WBC 13.2* 13.8*  NEUTROABS 10.7*  --   HGB 12.6* 12.2*  HCT 36.9* 36.6*  MCV  90.7 90.8  PLT 383 340   Cardiac Enzymes:  Recent Labs Lab 07/15/13 0854  TROPONINI 0.01   BNP (last 3 results)  Recent Labs  07/13/13 1835  PROBNP 519.5*   CBG: No results found for this basename: GLUCAP,  in the last 168 hours  No results found for this or any previous visit (from the past 240 hour(s)).   Studies: Ct Angio Chest Pe W/cm &/or Wo Cm  07/16/2013   CLINICAL DATA:  Shortness of breath, neck/back pain, elevated D-dimer, evaluate for PE. Lap band surgery in January.  EXAM: CT ANGIOGRAPHY CHEST WITH CONTRAST  TECHNIQUE: Multidetector CT imaging of the chest  was performed using the standard protocol during bolus administration of intravenous contrast. Multiplanar CT image reconstructions and MIPs were obtained to evaluate the vascular anatomy.  CONTRAST:  94mL OMNIPAQUE IOHEXOL 350 MG/ML SOLN  COMPARISON:  None.  FINDINGS: Tiny subsegmental filling defects within branches of the right lower lobe pulmonary artery (series 5/ images 169, 170, and 189), suspicious for pulmonary emboli. Overall clot burden is small. No findings to suggest right heart strain (RV-to-LV ratio 0.87).  Small to moderate left and small right pleural effusions. No frank interstitial edema. No pneumothorax.  Visualized right thyroid is notable for a suspected 2.2 cm thyroid nodule (series 4/ image 1), incompletely visualized.  The heart is top-normal in size. Small to moderate pericardial effusion with a mildly irregular appearance. Mild atherosclerotic calcifications of the aortic arch.  No suspicious mediastinal, hilar, or axillary lymphadenopathy.  Visualized upper abdomen is notable for a laparoscopic band in satisfactory position.  Degenerative changes of the visualized thoracolumbar spine.  Review of the MIP images confirms the above findings.  IMPRESSION: Suspected tiny subsegmental pulmonary emboli within branches of the right lower lobe pulmonary artery. Overall clot burden is small.  Small to moderate left and small right pleural effusions. No frank interstitial edema.  Small to moderate pericardial effusion. Given the irregular appearance, correlate for pericarditis.  Critical Value/emergent results were called by telephone at the time of interpretation on 07/16/2013 at 5:28 PM to Dr. Gwyndolyn Saxon HOPPER, who verbally acknowledged these results.   Electronically Signed   By: Julian Hy M.D.   On: 07/16/2013 17:29    Scheduled Meds: . amLODipine  5 mg Oral Daily   And  . benazepril  10 mg Oral Daily  . aspirin  325 mg Oral Daily  . calcium carbonate  250 mg Oral TID WC  .  finasteride  5 mg Oral Daily  . furosemide  40 mg Intravenous Daily  . gabapentin  100 mg Oral BID  . LORazepam  0.5 mg Oral Daily  . LORazepam  1 mg Oral QHS  . risperiDONE  0.5 mg Oral QHS  . Rivaroxaban  15 mg Oral BID WC  . sertraline  150 mg Oral Q breakfast  . simvastatin  20 mg Oral q1800  . vitamin B-12  1,000 mcg Oral Daily   Continuous Infusions:   Active Problems:   HYPERLIPIDEMIA   HTN (hypertension)   SLEEP APNEA   Morbid obesity   Morbid obesity with BMI of 45.0-49.9, adult   H/O laparoscopic adjustable gastric banding & hiatal hernia repair 04/08/13   Pericarditis   Post viral syndrome   Pulmonary embolism   CHF (congestive heart failure)    Time spent: 25 min    Hosie Poisson  [facility] Pager [phone]  If 7PM-7AM, please contact night-coverage at [email or web address], password [facility] 07/17/2013, 6:13 PM  LOS: 1 day    Addendum: Notified by RN, that patient went into AFIB with RVR. 12 L EKG shows af ib with RVR with rate in 140's. cardizem 10mg  IV ordered. Transferred the patient to stepdown.  Pt is asymptomatic and denies any complaints. Paged the cardiology PA to notify of the new development.   Hosie Poisson, MD [phone].

## 2013-07-17 NOTE — Progress Notes (Signed)
Report called to [facility]. Pt transferring to [place].

## 2013-07-18 ENCOUNTER — Encounter (HOSPITAL_COMMUNITY): Payer: Self-pay | Admitting: Anesthesiology

## 2013-07-18 ENCOUNTER — Encounter (HOSPITAL_COMMUNITY): Payer: Medicare Other | Admitting: Anesthesiology

## 2013-07-18 ENCOUNTER — Inpatient Hospital Stay (HOSPITAL_COMMUNITY): Payer: Medicare Other

## 2013-07-18 ENCOUNTER — Inpatient Hospital Stay (HOSPITAL_COMMUNITY): Payer: Medicare Other | Admitting: Anesthesiology

## 2013-07-18 ENCOUNTER — Encounter (HOSPITAL_COMMUNITY)
Admission: EM | Disposition: A | Discharge status: Home / Self Care | Payer: Self-pay | Source: Home / Self Care | Attending: Internal Medicine

## 2013-07-18 DIAGNOSIS — I2699 Other pulmonary embolism without acute cor pulmonale: Secondary | ICD-10-CM | POA: Diagnosis present

## 2013-07-18 DIAGNOSIS — I319 Disease of pericardium, unspecified: Secondary | ICD-10-CM

## 2013-07-18 HISTORY — PX: SUBXYPHOID PERICARDIAL WINDOW: SHX5075

## 2013-07-18 HISTORY — PX: INTRAOPERATIVE TRANSESOPHAGEAL ECHOCARDIOGRAM: SHX5062

## 2013-07-18 LAB — CARDIOLIPIN ANTIBODIES, IGG, IGM, IGA
Anticardiolipin IgA: 11 APL U/mL — ABNORMAL LOW (ref <22)
Anticardiolipin IgG: 6 GPL U/mL — ABNORMAL LOW (ref <23)
Anticardiolipin IgM: 0 MPL U/mL — ABNORMAL LOW (ref <11)

## 2013-07-18 LAB — LACTATE DEHYDROGENASE, PLEURAL OR PERITONEAL FLUID: LD FL: 602 U/L — AB (ref 3–23)

## 2013-07-18 LAB — CBC
HCT: 34.7 % — ABNORMAL LOW (ref 39.0–52.0)
Hemoglobin: 11.4 g/dL — ABNORMAL LOW (ref 13.0–17.0)
MCH: 30 pg (ref 26.0–34.0)
MCHC: 32.9 g/dL (ref 30.0–36.0)
MCV: 91.3 fL (ref 78.0–100.0)
Platelets: 319 10*3/uL (ref 150–400)
RBC: 3.8 MIL/uL — ABNORMAL LOW (ref 4.22–5.81)
RDW: 14.8 % (ref 11.5–15.5)
WBC: 13 10*3/uL — ABNORMAL HIGH (ref 4.0–10.5)

## 2013-07-18 LAB — HEPATIC FUNCTION PANEL
ALBUMIN: 2.5 g/dL — AB (ref 3.5–5.2)
ALT: 152 U/L — ABNORMAL HIGH (ref 0–53)
ALT: 157 U/L — ABNORMAL HIGH (ref 0–53)
AST: 146 U/L — ABNORMAL HIGH (ref 0–37)
AST: 139 U/L — ABNORMAL HIGH (ref 0–37)
Albumin: 2.6 g/dL — ABNORMAL LOW (ref 3.5–5.2)
Alkaline Phosphatase: 508 U/L — ABNORMAL HIGH (ref 39–117)
Alkaline Phosphatase: 461 U/L — ABNORMAL HIGH (ref 39–117)
BILIRUBIN TOTAL: 1 mg/dL (ref 0.3–1.2)
BILIRUBIN TOTAL: 1 mg/dL (ref 0.3–1.2)
Bilirubin, Direct: 0.5 mg/dL — ABNORMAL HIGH (ref 0.0–0.3)
Bilirubin, Direct: 0.5 mg/dL — ABNORMAL HIGH (ref 0.0–0.3)
Indirect Bilirubin: 0.5 mg/dL (ref 0.3–0.9)
Indirect Bilirubin: 0.5 mg/dL (ref 0.3–0.9)
Total Protein: 6.7 g/dL (ref 6.0–8.3)
Total Protein: 6.5 g/dL (ref 6.0–8.3)

## 2013-07-18 LAB — GRAM STAIN

## 2013-07-18 LAB — BASIC METABOLIC PANEL
BUN: 38 mg/dL — ABNORMAL HIGH (ref 6–23)
CO2: 23 mEq/L (ref 19–32)
Calcium: 8.7 mg/dL (ref 8.4–10.5)
Chloride: 103 mEq/L (ref 96–112)
Creatinine, Ser: 1.2 mg/dL (ref 0.50–1.35)
GFR calc Af Amer: 71 mL/min — ABNORMAL LOW (ref >90)
GFR, EST NON AFRICAN AMERICAN: 61 mL/min — AB (ref >90)
Glucose, Bld: 112 mg/dL — ABNORMAL HIGH (ref 70–99)
POTASSIUM: 3.2 meq/L — AB (ref 3.7–5.3)
SODIUM: 141 meq/L (ref 137–147)

## 2013-07-18 LAB — TROPONIN I

## 2013-07-18 LAB — HEPARIN LEVEL (UNFRACTIONATED): Heparin Unfractionated: >2.2 IU/mL — ABNORMAL HIGH (ref 0.30–0.70)

## 2013-07-18 LAB — BETA-2-GLYCOPROTEIN I ABS, IGG/M/A
Beta-2 Glyco I IgG: 4 G Units (ref <20)
Beta-2-Glycoprotein I IgA: 9 A Units (ref <20)
Beta-2-Glycoprotein I IgM: 5 M Units (ref <20)

## 2013-07-18 LAB — TYPE AND SCREEN
ABO/RH(D): O POS
Antibody Screen: NEGATIVE

## 2013-07-18 LAB — ABO/RH: ABO/RH(D): O POS

## 2013-07-18 LAB — PROTEIN S, TOTAL: Protein S Ag, Total: 110 % (ref 60–150)

## 2013-07-18 LAB — PROTEIN C, TOTAL: Protein C, Total: 120 % (ref 72–160)

## 2013-07-18 LAB — GLUCOSE, SEROUS FLUID: Glucose, Fluid: 97 mg/dL

## 2013-07-18 LAB — APTT: aPTT: 41 seconds — ABNORMAL HIGH (ref 24–37)

## 2013-07-18 LAB — BODY FLUID CELL COUNT WITH DIFFERENTIAL
EOS FL: 2 %
Lymphs, Fluid: 12 %
Monocyte-Macrophage-Serous Fluid: 9 % — ABNORMAL LOW (ref 50–90)
NEUTROPHIL FLUID: 77 % — AB (ref 0–25)
Total Nucleated Cell Count, Fluid: 3400 cu mm — ABNORMAL HIGH (ref 0–1000)

## 2013-07-18 LAB — PROTEIN, BODY FLUID: Total protein, fluid: 4.9 g/dL

## 2013-07-18 LAB — MRSA PCR SCREENING: MRSA by PCR: NEGATIVE

## 2013-07-18 LAB — ANA: ANA: NEGATIVE

## 2013-07-18 LAB — FACTOR 5 LEIDEN

## 2013-07-18 SURGERY — CREATION, PERICARDIAL WINDOW, SUBXIPHOID APPROACH
Anesthesia: General | Site: Chest

## 2013-07-18 MED ORDER — PROPOFOL 10 MG/ML IV BOLUS
INTRAVENOUS | Status: DC | PRN
  Administered 2013-07-18: 200 mg via INTRAVENOUS

## 2013-07-18 MED ORDER — DEXAMETHASONE SODIUM PHOSPHATE 4 MG/ML IJ SOLN
INTRAMUSCULAR | Status: AC
  Filled 2013-07-18: qty 2

## 2013-07-18 MED ORDER — PROPOFOL 10 MG/ML IV BOLUS
INTRAVENOUS | Status: AC
  Filled 2013-07-18: qty 20

## 2013-07-18 MED ORDER — ONDANSETRON HCL 4 MG/2ML IJ SOLN
INTRAMUSCULAR | Status: AC
  Filled 2013-07-18: qty 2

## 2013-07-18 MED ORDER — NALOXONE HCL 0.4 MG/ML IJ SOLN: 0.4000 mg | INTRAMUSCULAR | Status: DC | PRN

## 2013-07-18 MED ORDER — GLYCOPYRROLATE 0.2 MG/ML IJ SOLN
INTRAMUSCULAR | Status: AC
  Filled 2013-07-18: qty 3

## 2013-07-18 MED ORDER — ALBUTEROL SULFATE HFA 108 (90 BASE) MCG/ACT IN AERS
INHALATION_SPRAY | RESPIRATORY_TRACT | Status: AC
  Filled 2013-07-18: qty 6.7

## 2013-07-18 MED ORDER — LACTATED RINGERS IV SOLN
INTRAVENOUS | Status: DC | PRN
  Administered 2013-07-18: 12:00:00 via INTRAVENOUS

## 2013-07-18 MED ORDER — SODIUM CHLORIDE 0.9 % IV SOLN
10.0000 mg | INTRAVENOUS | Status: DC | PRN
  Administered 2013-07-18: 10 ug/min via INTRAVENOUS

## 2013-07-18 MED ORDER — FENTANYL 10 MCG/ML IV SOLN
INTRAVENOUS | Status: DC
  Filled 2013-07-18 (×2): qty 50

## 2013-07-18 MED ORDER — ROCURONIUM BROMIDE 50 MG/5ML IV SOLN
INTRAVENOUS | Status: AC
  Filled 2013-07-18: qty 1

## 2013-07-18 MED ORDER — COLCHICINE 0.6 MG PO TABS
0.6000 mg | ORAL_TABLET | Freq: Two times a day (BID) | ORAL | Status: DC
  Administered 2013-07-18 – 2013-07-25 (×14): 0.6 mg via ORAL
  Filled 2013-07-18 (×15): qty 1

## 2013-07-18 MED ORDER — LIDOCAINE HCL (CARDIAC) 20 MG/ML IV SOLN
INTRAVENOUS | Status: AC
  Filled 2013-07-18: qty 5

## 2013-07-18 MED ORDER — MIDAZOLAM HCL 5 MG/5ML IJ SOLN
INTRAMUSCULAR | Status: DC | PRN
  Administered 2013-07-18 (×2): 1 mg via INTRAVENOUS

## 2013-07-18 MED ORDER — HEPARIN SODIUM (PORCINE) 1000 UNIT/ML IJ SOLN
INTRAMUSCULAR | Status: DC | PRN
  Administered 2013-07-18: 10000 [IU]

## 2013-07-18 MED ORDER — DEXAMETHASONE SODIUM PHOSPHATE 4 MG/ML IJ SOLN
INTRAMUSCULAR | Status: DC | PRN
  Administered 2013-07-18: 8 mg via INTRAVENOUS

## 2013-07-18 MED ORDER — LABETALOL HCL 5 MG/ML IV SOLN
INTRAVENOUS | Status: DC | PRN
  Administered 2013-07-18: 10 mg via INTRAVENOUS

## 2013-07-18 MED ORDER — SODIUM CHLORIDE 0.9 % IJ SOLN: 9.0000 mL | INTRAMUSCULAR | Status: DC | PRN

## 2013-07-18 MED ORDER — NEOSTIGMINE METHYLSULFATE 1 MG/ML IJ SOLN
INTRAMUSCULAR | Status: AC
  Filled 2013-07-18: qty 10

## 2013-07-18 MED ORDER — VANCOMYCIN HCL IN DEXTROSE 1-5 GM/200ML-% IV SOLN
INTRAVENOUS | Status: AC
  Filled 2013-07-18: qty 200

## 2013-07-18 MED ORDER — HYDROMORPHONE HCL PF 1 MG/ML IJ SOLN
0.5000 mg | INTRAMUSCULAR | Status: DC | PRN
  Administered 2013-07-18 – 2013-07-20 (×4): 0.5 mg via INTRAVENOUS
  Filled 2013-07-18 (×4): qty 1

## 2013-07-18 MED ORDER — HEPARIN (PORCINE) IN NACL 100-0.45 UNIT/ML-% IJ SOLN
1700.0000 [IU]/h | INTRAMUSCULAR | Status: DC
  Administered 2013-07-18: 1700 [IU]/h via INTRAVENOUS
  Filled 2013-07-18 (×2): qty 250

## 2013-07-18 MED ORDER — NEOSTIGMINE METHYLSULFATE 1 MG/ML IJ SOLN
INTRAMUSCULAR | Status: DC | PRN
  Administered 2013-07-18: 5 mg via INTRAVENOUS

## 2013-07-18 MED ORDER — ROCURONIUM BROMIDE 100 MG/10ML IV SOLN
INTRAVENOUS | Status: DC | PRN
  Administered 2013-07-18: 20 mg via INTRAVENOUS
  Administered 2013-07-18: 50 mg via INTRAVENOUS

## 2013-07-18 MED ORDER — 0.9 % SODIUM CHLORIDE (POUR BTL) OPTIME
TOPICAL | Status: DC | PRN
  Administered 2013-07-18: 1000 mL

## 2013-07-18 MED ORDER — HEPARIN SODIUM (PORCINE) 1000 UNIT/ML IJ SOLN
INTRAMUSCULAR | Status: AC
  Filled 2013-07-18: qty 1

## 2013-07-18 MED ORDER — ONDANSETRON HCL 4 MG/2ML IJ SOLN
INTRAMUSCULAR | Status: DC | PRN
  Administered 2013-07-18: 4 mg via INTRAVENOUS

## 2013-07-18 MED ORDER — FENTANYL CITRATE 0.05 MG/ML IJ SOLN
INTRAMUSCULAR | Status: DC | PRN
  Administered 2013-07-18: 150 ug via INTRAVENOUS
  Administered 2013-07-18: 50 ug via INTRAVENOUS

## 2013-07-18 MED ORDER — LIDOCAINE HCL (CARDIAC) 20 MG/ML IV SOLN
INTRAVENOUS | Status: DC | PRN
  Administered 2013-07-18: 80 mg via INTRAVENOUS

## 2013-07-18 MED ORDER — ONDANSETRON HCL 4 MG/2ML IJ SOLN: 4.0000 mg | Freq: Four times a day (QID) | INTRAMUSCULAR | Status: DC | PRN

## 2013-07-18 MED ORDER — POTASSIUM CHLORIDE ER 10 MEQ PO TBCR
60.0000 meq | EXTENDED_RELEASE_TABLET | Freq: Once | ORAL | Status: AC
  Administered 2013-07-18: 60 meq via ORAL
  Filled 2013-07-18 (×2): qty 6

## 2013-07-18 MED ORDER — KETOROLAC TROMETHAMINE 30 MG/ML IJ SOLN
15.0000 mg | Freq: Four times a day (QID) | INTRAMUSCULAR | Status: DC | PRN
  Filled 2013-07-18: qty 1

## 2013-07-18 MED ORDER — SUCCINYLCHOLINE CHLORIDE 20 MG/ML IJ SOLN
INTRAMUSCULAR | Status: AC
  Filled 2013-07-18: qty 1

## 2013-07-18 MED ORDER — MIDAZOLAM HCL 2 MG/2ML IJ SOLN
INTRAMUSCULAR | Status: AC
  Filled 2013-07-18: qty 2

## 2013-07-18 MED ORDER — HEPARIN (PORCINE) IN NACL 100-0.45 UNIT/ML-% IJ SOLN
1900.0000 [IU]/h | INTRAMUSCULAR | Status: AC
  Administered 2013-07-19: 1700 [IU]/h via INTRAVENOUS
  Filled 2013-07-18 (×3): qty 250

## 2013-07-18 MED ORDER — ALBUTEROL SULFATE HFA 108 (90 BASE) MCG/ACT IN AERS
INHALATION_SPRAY | RESPIRATORY_TRACT | Status: DC | PRN
  Administered 2013-07-18: 6 via RESPIRATORY_TRACT

## 2013-07-18 MED ORDER — GLYCOPYRROLATE 0.2 MG/ML IJ SOLN
INTRAMUSCULAR | Status: DC | PRN
  Administered 2013-07-18: 0.6 mg via INTRAVENOUS

## 2013-07-18 MED ORDER — HYDROMORPHONE HCL PF 1 MG/ML IJ SOLN: 0.2500 mg | INTRAMUSCULAR | Status: DC | PRN

## 2013-07-18 MED ORDER — STERILE WATER FOR INJECTION IJ SOLN
INTRAMUSCULAR | Status: AC
  Filled 2013-07-18: qty 10

## 2013-07-18 MED ORDER — FENTANYL CITRATE 0.05 MG/ML IJ SOLN
INTRAMUSCULAR | Status: AC
  Filled 2013-07-18: qty 5

## 2013-07-18 MED ORDER — ARTIFICIAL TEARS OP OINT
TOPICAL_OINTMENT | OPHTHALMIC | Status: AC
  Filled 2013-07-18: qty 3.5

## 2013-07-18 MED ORDER — DEXTROSE-NACL 5-0.45 % IV SOLN
INTRAVENOUS | Status: DC
  Administered 2013-07-18: 17:00:00 via INTRAVENOUS

## 2013-07-18 MED ORDER — EPHEDRINE SULFATE 50 MG/ML IJ SOLN
INTRAMUSCULAR | Status: AC
  Filled 2013-07-18: qty 1

## 2013-07-18 MED ORDER — SUCCINYLCHOLINE CHLORIDE 20 MG/ML IJ SOLN
INTRAMUSCULAR | Status: DC | PRN
  Administered 2013-07-18: 140 mg via INTRAVENOUS

## 2013-07-18 MED ORDER — DIPHENHYDRAMINE HCL 50 MG/ML IJ SOLN: 12.5000 mg | Freq: Four times a day (QID) | INTRAMUSCULAR | Status: DC | PRN

## 2013-07-18 MED ORDER — FENTANYL CITRATE 0.05 MG/ML IJ SOLN
25.0000 ug | INTRAMUSCULAR | Status: DC | PRN
  Administered 2013-07-18 – 2013-07-19 (×4): 50 ug via INTRAVENOUS
  Filled 2013-07-18 (×4): qty 2

## 2013-07-18 MED ORDER — DIPHENHYDRAMINE HCL 12.5 MG/5ML PO ELIX
12.5000 mg | ORAL_SOLUTION | Freq: Four times a day (QID) | ORAL | Status: DC | PRN
  Filled 2013-07-18: qty 5

## 2013-07-18 MED ORDER — DEXTROSE 5 % IV SOLN
1.5000 g | INTRAVENOUS | Status: DC
  Administered 2013-07-18: 1.5 g via INTRAVENOUS
  Filled 2013-07-18: qty 1.5

## 2013-07-18 MED ORDER — ARTIFICIAL TEARS OP OINT
TOPICAL_OINTMENT | OPHTHALMIC | Status: DC | PRN
  Administered 2013-07-18: 1 via OPHTHALMIC

## 2013-07-18 MED ORDER — EPHEDRINE SULFATE 50 MG/ML IJ SOLN
INTRAMUSCULAR | Status: DC | PRN
  Administered 2013-07-18 (×2): 10 mg via INTRAVENOUS

## 2013-07-18 MED ORDER — ONDANSETRON HCL 4 MG/2ML IJ SOLN: 4.0000 mg | Freq: Once | INTRAMUSCULAR | Status: DC | PRN

## 2013-07-18 MED ORDER — VANCOMYCIN HCL 1000 MG IV SOLR
1000.0000 mg | Freq: Two times a day (BID) | INTRAVENOUS | Status: DC
  Administered 2013-07-18: 1000 mg via INTRAVENOUS

## 2013-07-18 SURGICAL SUPPLY — 53 items
BENZOIN TINCTURE PRP APPL 2/3 (GAUZE/BANDAGES/DRESSINGS) IMPLANT
BLADE STERNUM SYSTEM 6 (BLADE) ×3 IMPLANT
CANISTER SUCTION 2500CC (MISCELLANEOUS) ×3 IMPLANT
CATH THORACIC 28FR (CATHETERS) IMPLANT
CATH THORACIC 28FR RT ANG (CATHETERS) IMPLANT
CATH THORACIC 36FR (CATHETERS) IMPLANT
CATH THORACIC 36FR RT ANG (CATHETERS) IMPLANT
CLIP TI MEDIUM 24 (CLIP) ×3 IMPLANT
CLIP TI WIDE RED SMALL 24 (CLIP) ×3 IMPLANT
CONN ST 1/4X3/8  BEN (MISCELLANEOUS) ×1
CONN ST 1/4X3/8 BEN (MISCELLANEOUS) ×2 IMPLANT
CONT SPEC 4OZ CLIKSEAL STRL BL (MISCELLANEOUS) ×15 IMPLANT
COVER SURGICAL LIGHT HANDLE (MISCELLANEOUS) ×3 IMPLANT
DERMABOND ADVANCED (GAUZE/BANDAGES/DRESSINGS) ×1
DERMABOND ADVANCED .7 DNX12 (GAUZE/BANDAGES/DRESSINGS) ×2 IMPLANT
DRAIN CHANNEL 28F RND 3/8 FF (WOUND CARE) ×3 IMPLANT
DRAPE LAPAROSCOPIC ABDOMINAL (DRAPES) ×3 IMPLANT
ELECT REM PT RETURN 9FT ADLT (ELECTROSURGICAL) ×3
ELECTRODE REM PT RTRN 9FT ADLT (ELECTROSURGICAL) ×2 IMPLANT
GLOVE BIO SURGEON STRL SZ 6 (GLOVE) ×6 IMPLANT
GLOVE BIO SURGEON STRL SZ 6.5 (GLOVE) ×6 IMPLANT
GLOVE BIOGEL PI IND STRL 7.0 (GLOVE) ×6 IMPLANT
GLOVE BIOGEL PI INDICATOR 7.0 (GLOVE) ×3
GOWN STRL REUS W/ TWL LRG LVL3 (GOWN DISPOSABLE) ×6 IMPLANT
GOWN STRL REUS W/TWL LRG LVL3 (GOWN DISPOSABLE) ×3
HEMOSTAT POWDER SURGIFOAM 1G (HEMOSTASIS) IMPLANT
KIT BASIN OR (CUSTOM PROCEDURE TRAY) ×3 IMPLANT
KIT ROOM TURNOVER OR (KITS) ×3 IMPLANT
KIT SUCTION CATH 14FR (SUCTIONS) ×3 IMPLANT
NS IRRIG 1000ML POUR BTL (IV SOLUTION) ×6 IMPLANT
PACK CHEST (CUSTOM PROCEDURE TRAY) ×3 IMPLANT
PAD ARMBOARD 7.5X6 YLW CONV (MISCELLANEOUS) ×6 IMPLANT
PAD ELECT DEFIB RADIOL ZOLL (MISCELLANEOUS) ×3 IMPLANT
SPONGE GAUZE 4X4 12PLY (GAUZE/BANDAGES/DRESSINGS) IMPLANT
SPONGE GAUZE 4X4 12PLY STER LF (GAUZE/BANDAGES/DRESSINGS) ×3 IMPLANT
SPONGE INTESTINAL PEANUT (DISPOSABLE) ×3 IMPLANT
STRIP CLOSURE SKIN 1/2X4 (GAUZE/BANDAGES/DRESSINGS) IMPLANT
SUT SILK  1 MH (SUTURE) ×1
SUT SILK 1 MH (SUTURE) ×2 IMPLANT
SUT VIC AB 1 CTX 18 (SUTURE) ×3 IMPLANT
SUT VIC AB 2-0 CTX 27 (SUTURE) ×3 IMPLANT
SUT VIC AB 3-0 X1 27 (SUTURE) ×3 IMPLANT
SWAB COLLECTION DEVICE MRSA (MISCELLANEOUS) ×3 IMPLANT
SYR 50ML SLIP (SYRINGE) IMPLANT
SYRINGE 10CC LL (SYRINGE) IMPLANT
SYSTEM SAHARA CHEST DRAIN ATS (WOUND CARE) ×3 IMPLANT
TAPE CLOTH SURG 4X10 WHT LF (GAUZE/BANDAGES/DRESSINGS) ×3 IMPLANT
TOWEL OR 17X24 6PK STRL BLUE (TOWEL DISPOSABLE) ×3 IMPLANT
TOWEL OR 17X26 10 PK STRL BLUE (TOWEL DISPOSABLE) ×3 IMPLANT
TRAP SPECIMEN MUCOUS 40CC (MISCELLANEOUS) ×9 IMPLANT
TRAY FOLEY CATH 14FRSI W/METER (CATHETERS) ×3 IMPLANT
TUBE ANAEROBIC SPECIMEN COL (MISCELLANEOUS) ×3 IMPLANT
WATER STERILE IRR 1000ML POUR (IV SOLUTION) ×6 IMPLANT

## 2013-07-18 NOTE — Progress Notes (Signed)
Echocardiogram Echocardiogram Transesophageal has been performed.  [name] 07/18/2013, 12:43 PM

## 2013-07-18 NOTE — Consult Note (Signed)
[address]       [place] [address]             [phone]        Tanmay A Thornley  Medical Record [id number] Date of Birth: [birth year]  Referring: Dr. Debara Pickett Primary Care: [name], MD  Chief Complaint:   Pericardial Effusion  Chief Complaint  Patient presents with  . Shortness of Breath  . Chest Pain   History of Present Illness:      Mr. Housholder is a 68 yo morbidly obese male S/P Gastric Bypass Surgery done 04/08/2013.  He presented to the ED over the weekend with the complaints of cough, chest pain, shortness of breath, and wheezing.  Workup in the ED consisted of CXR which did not show evidence of pneumonia, but did show some mild heart failure.  His LFTS were elevated, and RUQ ultrasound was performed and felt to be unremarkable.  His Troponin levels were negative and no EKG changes were noted.  He was therefore placed on Lasix 20 mg for 5 days.  He was discharged home and told to follow up with his PCP.  He presented for follow up with PCP on Tuesday 07/15/13 at which time he underwent 2D echo which showed evidence of a small to moderate pericardial effusion.  He also concerned regarding patient's symptoms that he could have a PE.  He started him on Xarelto and ordered a CT scan.  This was done later that evening and confirmed the presence of a subsegmental pulmonary emboli within branches of the right lower lobe pulmonary artery.  He was also felt to have some Pericarditis.  Due to these findings he was instructed to present to the ED at [facility].  Upon arrival he was admitted by the Medicine service.  He remained on Xarelto.  They felt that he most likely did not have Pericarditis but had effusions due to his recent viral illness.  He was treated with IV Lasix for mild CHF.  Repeat ECHO was obtained and showed evidence of a moderate to large pericardial effusion.  Cardiology consult was obtained.  He was evaluated by Dr. Debara Pickett who  found the patient to be in new onset Atrial Fibrillation.  He was being treated with Cardizem and he was in agreement to continue current treatment.  He was placed on IV Heparin and Xarelto was stopped due to need for subxiphoid window.  Therefore, TCTS was consulted.  Currently the patient is chest pain free.  He continues to have a dry cough and some shortness of breath.  After review of his Echocardiogram it was felt that his Pericardial effusion was not severe, but would benefit from Sub Xiphoid window to possibly identify the source of this.  The patient does have multiple other medical problems including elevated liver enzymes that have started to increase this month.  Current Activity/ Functional Status: Patient is independent with mobility/ambulation, transfers, ADL's, IADL's.   Zubrod Score: At the time of surgery this patient's most appropriate activity status/level should be described as: []     0    Normal activity, no symptoms [x]     1    Restricted in physical strenuous activity but ambulatory, able to do out light work []     2    Ambulatory and capable of self care, unable to do work activities, up and about  more than 50%  Of the time                            []     3    Only limited self care, in bed greater than 50% of waking hours []     4    Completely disabled, no self care, confined to bed or chair []     5    Moribund  Past Medical History  Diagnosis Date  . Benign prostatic hypertrophy   . Hypertension   . Gout   . Depression   . FH: colonic polyps   . Morbid obesity   . Complication of anesthesia     MORPHINE CAUSES HALLUCINATIONS  . Asthma     childhood asthma  . Numbness     feet / legs  . Cancer   . History of kidney stones   . Kidney cysts   . Sleep apnea     USES C PAP  . Bilateral hip pain     Past Surgical History  Procedure Laterality Date  . Total knee replacment      bilat  . Cholecystectomy  1989  . Trigger finger repair       also lue, cts surgically repaired  . Renal calculi    . Tonsillectomy    . Lithotripsy    . Arthroscopy knee w/ drilling    . Breath tek h pylori N/A 12/23/2012    Procedure: BREATH TEK H PYLORI;  Surgeon: Gayland Curry, MD;  Location: [facility];  Service: General;  Laterality: N/A;  . Wisdom tooth extraction    . Laparoscopic gastric banding with hiatal hernia repair N/A 04/08/2013    Procedure: LAPAROSCOPIC GASTRIC BANDING WITH possible  HIATAL HERNIA REPAIR;  Surgeon: Gayland Curry, MD;  Location: [facility];  Service: General;  Laterality: N/A;    History  Smoking status  . Former Smoker  . Types: Cigarettes  . Quit date: 08/30/1974  Smokeless tobacco  . Not on file    History  Alcohol Use  . Yes    Comment: Occasionally; 1 small drink a week    History   Social History  . Marital Status: Married    Spouse Name: N/A    Number of Children: N/A  . Years of Education: N/A   Occupational History  . Not on file.   Social History Main Topics  . Smoking status: Former Smoker    Types: Cigarettes    Quit date: 08/30/1974  . Smokeless tobacco: Not on file  . Alcohol Use: Yes     Comment: Occasionally; 1 small drink a week  . Drug Use: No  . Sexual Activity: Not on file   Other Topics Concern  . Not on file   Social History Narrative  . No narrative on file    Allergies  Allergen Reactions  . Propoxyphene N-Acetaminophen Nausea Only  . Codeine     nausea  . Morphine     Nausea Severe hallucinations  . Penicillins     LOC with shot    Current Facility-Administered Medications  Medication Dose Route Frequency Provider Last Rate Last Dose  . aspirin tablet 325 mg  325 mg Oral Daily Orvan Falconer, MD   325 mg at 07/17/13 1041  . calcium carbonate (TUMS - dosed in mg elemental calcium) chewable tablet 250 mg  250 mg Oral TID WC Orvan Falconer, MD   250 mg  at 07/17/13 1701  . diltiazem (CARDIZEM) 100 mg in dextrose 5 % 100 mL infusion  5-15 mg/hr Intravenous Continuous  Hosie Poisson, MD 5 mL/hr at 07/18/13 1011 5 mg/hr at 07/18/13 1011  . finasteride (PROSCAR) tablet 5 mg  5 mg Oral Daily Orvan Falconer, MD   5 mg at 07/17/13 1041  . furosemide (LASIX) injection 40 mg  40 mg Intravenous Daily Orvan Falconer, MD   40 mg at 07/17/13 1041  . gabapentin (NEURONTIN) capsule 100 mg  100 mg Oral BID Orvan Falconer, MD   100 mg at 07/17/13 2248  . heparin ADULT infusion 100 units/mL (25000 units/250 mL)  1,700 Units/hr Intravenous Continuous Hosie Poisson, MD 17 mL/hr at 07/18/13 0501 1,700 Units/hr at 07/18/13 0501  . HYDROmorphone (DILAUDID) injection 1 mg  1 mg Intravenous Q4H PRN Hosie Poisson, MD      . LORazepam (ATIVAN) tablet 0.5 mg  0.5 mg Oral Daily Orvan Falconer, MD   0.5 mg at 07/17/13 1041  . LORazepam (ATIVAN) tablet 1 mg  1 mg Oral QHS Orvan Falconer, MD   1 mg at 07/17/13 2249  . ondansetron (ZOFRAN) tablet 4 mg  4 mg Oral Q6H PRN Orvan Falconer, MD       Or  . ondansetron [facility]) injection 4 mg  4 mg Intravenous Q6H PRN Orvan Falconer, MD      . risperiDONE (RISPERDAL) tablet 0.5 mg  0.5 mg Oral QHS Orvan Falconer, MD   0.5 mg at 07/17/13 2248  . sertraline (ZOLOFT) tablet 150 mg  150 mg Oral Q breakfast Orvan Falconer, MD   150 mg at 07/17/13 0853  . vitamin B-12 (CYANOCOBALAMIN) tablet 1,000 mcg  1,000 mcg Oral Daily Orvan Falconer, MD   1,000 mcg at 07/17/13 1041    Prescriptions prior to admission  Medication Sig Dispense Refill  . allopurinol (ZYLOPRIM) 300 MG tablet Take 300 mg by mouth daily with breakfast.       . amLODipine-benazepril (LOTREL) 5-10 MG per capsule Take 1 capsule by mouth daily.      . calcium gluconate 500 MG tablet Take 1 tablet by mouth 3 (three) times daily.      . Cyanocobalamin (VITAMIN B-12) 1000 MCG SUBL Place 1,000 mcg under the tongue every morning.      . finasteride (PROSCAR) 5 MG tablet Take 5 mg by mouth daily.       . furosemide (LASIX) 20 MG tablet Take 1 tablet (20 mg total) by mouth daily.  5 tablet  0  . gabapentin (NEURONTIN) 100 MG capsule Take 1 capsule (100 mg  total) by mouth 2 (two) times daily.  60 capsule  2  . Glucosamine-Chondroit-Vit C-Mn (GLUCOSAMINE 1500 COMPLEX PO) Take 1,500 mg by mouth daily.      . Loperamide-Simethicone (IMODIUM MULTI-SYMPTOM RELIEF) 2-125 MG TABS Take 1-2 tablets by mouth 2 (two) times daily as needed (diarrhea).      . LORazepam (ATIVAN) 0.5 MG tablet Take 0.5-1 mg by mouth 2 (two) times daily. 0.5 mg in the morning and 1 mg at bedtime      . Multiple Vitamin (MULTIVITAMIN) tablet Take 1 tablet by mouth daily. Chewable tablet with iron      . ondansetron (ZOFRAN) 4 MG tablet Take 4 mg by mouth every 8 (eight) hours as needed for nausea or vomiting.      . risperiDONE (RISPERDAL) 0.5 MG tablet Take 1 tablet (0.5 mg total) by mouth at bedtime.  30 tablet  2  .  Rivaroxaban (XARELTO) 15 MG TABS tablet Take 1 tablet (15 mg total) by mouth 2 (two) times daily with a meal.  14 tablet  0  . sertraline (ZOLOFT) 100 MG tablet Take 1.5 tablets (150 mg total) by mouth daily with breakfast.  45 tablet  2  . Tetrahydrozoline HCl (VISINE OP) Place 2 drops into both eyes daily as needed (dry eyes).      . traMADol (ULTRAM) 50 MG tablet Take 25 mg by mouth every 6 (six) hours as needed for moderate pain.      [name] aspirin 325 MG tablet Take 325 mg by mouth daily.      . Flaxseed, Linseed, (FLAX SEED OIL) 1300 MG CAPS Take 1,300 mg by mouth daily.      . pravastatin (PRAVACHOL) 40 MG tablet Take 40 mg by mouth at bedtime.        Family History  Problem Relation Age of Onset  . Depression Mother   . Hypertension Mother   . Heart disease Father     MI  . Depression Brother   . Stroke Paternal Aunt     CVA  . Diabetes Paternal Uncle   . Heart disease Paternal Uncle     MI  . Heart disease Paternal Grandmother     MI  . Diabetes Cousin     PATERNAL     Review of Systems:     Cardiac Review of Systems: Y or N  Chest Pain [   y ]  Resting SOB [  n ] Exertional SOB  [ y ]  Orthopnea [  ]   Pedal Edema [ n ]    Palpitations [ n  ] Syncope  [ n ]   Presyncope [n   ]  General Review of Systems: [Y] = yes [  ]=no Constitional: recent weight change [  ]; anorexia [  ]; fatigue [ y ]; nausea [  ]; night sweats Blue.Reese  ]; fever [ n ]; or chills [ n ]                                                               Dental: poor dentition[ n ]; Last Dentist visit:   Eye : blurred vision [  ]; diplopia [   ]; vision changes [  ];  Amaurosis fugax[  ]; Resp: cough [  ];  wheezing[  ];  hemoptysis[  ]; shortness of breath[  ]; paroxysmal nocturnal dyspnea[  ]; dyspnea on exertion[  ]; or orthopnea[  ];  GI:  gallstones[  ], vomiting[  ];  dysphagia[  ]; melena[  ];  hematochezia [  ]; heartburn[  ];   Hx of  Colonoscopy[  ]; GU: kidney stones [  ]; hematuria[  ];   dysuria [  ];  nocturia[  ];  history of     obstruction [  ]; urinary frequency [  ]             Skin: rash, swelling[  ];, hair loss[  ];  peripheral edema[  ];  or itching[  ]; Musculosketetal: myalgias[  ];  joint swelling[  ];  joint erythema[  ];  joint pain[  ];  back pain[  ];  Heme/Lymph: bruising[  ];  bleeding[  ];  anemia[  ];  Neuro: TIA[  ];  headaches[  ];  stroke[  ];  vertigo[  ];  seizures[  ];   paresthesias[  ];  difficulty walking[  ];  Psych:depression[  ]; anxiety[  ];  Endocrine: diabetes[  ];  thyroid dysfunction[  ];  Immunizations: Flu [ n ]; Pneumococcal[ n];  Other:  Physical Exam: BP 116/71  Pulse 66  Temp(Src) 98.6 F (37 C) (Oral)  Resp 24  Ht 6' (1.829 m)  Wt 304 lb 10.8 oz (138.2 kg)  BMI 41.31 kg/m2  SpO2 95%  General appearance: alert, cooperative and no distress Heart: regular rate and rhythm Lungs: clear to auscultation bilaterally Abdomen: soft, non-tender; bowel sounds normal; no masses,  no organomegaly and scars present from lap band surgery.  Port identified and is currently not filled Extremities: edema 1+   Diagnostic Studies & Laboratory data:     Recent Radiology Findings:   Ct Angio Chest Pe W/cm &/or Wo  Cm  07/16/2013   CLINICAL DATA:  Shortness of breath, neck/back pain, elevated D-dimer, evaluate for PE. Lap band surgery in January.  EXAM: CT ANGIOGRAPHY CHEST WITH CONTRAST  TECHNIQUE: Multidetector CT imaging of the chest was performed using the standard protocol during bolus administration of intravenous contrast. Multiplanar CT image reconstructions and MIPs were obtained to evaluate the vascular anatomy.  CONTRAST:  52mL OMNIPAQUE IOHEXOL 350 MG/ML SOLN  COMPARISON:  None.  FINDINGS: Tiny subsegmental filling defects within branches of the right lower lobe pulmonary artery (series 5/ images 169, 170, and 189), suspicious for pulmonary emboli. Overall clot burden is small. No findings to suggest right heart strain (RV-to-LV ratio 0.87).  Small to moderate left and small right pleural effusions. No frank interstitial edema. No pneumothorax.  Visualized right thyroid is notable for a suspected 2.2 cm thyroid nodule (series 4/ image 1), incompletely visualized.  The heart is top-normal in size. Small to moderate pericardial effusion with a mildly irregular appearance. Mild atherosclerotic calcifications of the aortic arch.  No suspicious mediastinal, hilar, or axillary lymphadenopathy.  Visualized upper abdomen is notable for a laparoscopic band in satisfactory position.  Degenerative changes of the visualized thoracolumbar spine.  Review of the MIP images confirms the above findings.  IMPRESSION: Suspected tiny subsegmental pulmonary emboli within branches of the right lower lobe pulmonary artery. Overall clot burden is small.  Small to moderate left and small right pleural effusions. No frank interstitial edema.  Small to moderate pericardial effusion. Given the irregular appearance, correlate for pericarditis.  Critical Value/emergent results were called by telephone at the time of interpretation on 07/16/2013 at 5:28 PM to Dr. Gwyndolyn Saxon HOPPER, who verbally acknowledged these results.   Electronically Signed    By: Julian Hy M.D.   On: 07/16/2013 17:29      Recent Lab Findings: Lab Results  Component Value Date   WBC 13.0* 07/18/2013   HGB 11.4* 07/18/2013   HCT 34.7* 07/18/2013   PLT 319 07/18/2013   GLUCOSE 112* 07/18/2013   CHOL 138 12/05/2012   TRIG 137 12/05/2012   HDL 34* 12/05/2012   LDLDIRECT 111.6 02/15/2012   LDLCALC 77 12/05/2012   ALT 152* 07/18/2013   AST 146* 07/18/2013   NA 141 07/18/2013   K 3.2* 07/18/2013   CL 103 07/18/2013   CREATININE 1.20 07/18/2013   BUN 38* 07/18/2013   CO2 23 07/18/2013   TSH 1.731 12/05/2012   HGBA1C 6.1* 12/05/2012   ECHO: Transthoracic Echocardiography  Patient: Pradeep, Keirsey MR #: [id number] Study Date: 07/17/2013 Gender: M Age: 7 Height: 182.9cm Weight: 137.4kg BSA: 2.78m^2 Pt. Status: Room: [place], MD [facility] ADMITTING Iline Oven ATTENDING Hosie Poisson SONOGRAPHER Mauricio Po, RDCS, CCT cc:  ------------------------------------------------------------ LV EF: 55% - 60%  ------------------------------------------------------------ Indications: Pericardial effusion 423.9.  ------------------------------------------------------------ History: PMH: Angina pectoris. Risk factors: Pulmonary embolism. Pericarditis. Family history of coronary artery disease.  ------------------------------------------------------------ Study Conclusions  - Left ventricle: The cavity size was normal. There was mild concentric hypertrophy. Systolic function was normal. The estimated ejection fraction was in the range of 55% to 60%. Wall motion was normal; there were no regional wall motion abnormalities. - Aortic root: The aortic root was mildly dilated. - Mitral valve: Mild regurgitation. - Left atrium: The atrium was moderately to severely dilated. - Right atrium: The atrium was mildly to moderately dilated. - Pericardium, extracardiac: A moderate to large pericardial effusion was identified.  There was no evidence of hemodynamic compromise. Transthoracic echocardiography. M-mode, complete 2D, spectral Doppler, and color Doppler. Height: Height: 182.9cm. Height: 72in. Weight: Weight: 137.4kg. Weight: 302.4lb. Body mass index: BMI: 41.1kg/m^2. Body surface area: BSA: 2.21m^2. Blood pressure: 106/69. Patient status: Inpatient. Location: Echo laboratory.  ------------------------------------------------------------  ------------------------------------------------------------ Left ventricle: The cavity size was normal. There was mild concentric hypertrophy. Systolic function was normal. The estimated ejection fraction was in the range of 55% to 60%. Wall motion was normal; there were no regional wall motion abnormalities.  ------------------------------------------------------------ Aortic valve: Trileaflet; normal thickness leaflets. Mobility was not restricted. Doppler: Transvalvular velocity was within the normal range. There was no stenosis. No regurgitation.  ------------------------------------------------------------ Aorta: Aortic root: The aortic root was mildly dilated.  ------------------------------------------------------------ Mitral valve: Structurally normal valve. Mobility was not restricted. Doppler: Transvalvular velocity was within the normal range. There was no evidence for stenosis. Mild regurgitation. Peak gradient: 97mm Hg (D).  ------------------------------------------------------------ Left atrium: The atrium was moderately to severely dilated.  ------------------------------------------------------------ Right ventricle: The cavity size was normal. Wall thickness was normal. Systolic function was normal.  ------------------------------------------------------------ Pulmonic valve: Not well visualized. The valve appears to be grossly normal. Doppler: Transvalvular velocity was within the normal range. There was no evidence for  stenosis.  ------------------------------------------------------------ Tricuspid valve: Structurally normal valve. Doppler: Transvalvular velocity was within the normal range. Mild regurgitation.  ------------------------------------------------------------ Pulmonary artery: The main pulmonary artery was normal-sized. Systolic pressure was within the normal range.  ------------------------------------------------------------ Right atrium: The atrium was mildly to moderately dilated.  ------------------------------------------------------------ Pericardium: A moderate to large pericardial effusion was identified. There was no evidence of hemodynamic compromise.  ------------------------------------------------------------ Systemic veins: Inferior vena cava: The vessel was dilated; the respirophasic diameter changes were in the normal range (= 50%).  ------------------------------------------------------------  2D measurements Normal Doppler measurements Normal Left ventricle Left ventricle LVID ED, 51.3 mm 43-52 Ea, lat ann, 9.3 cm/s ------ chord, tiss DP PLAX E/Ea, lat 8.9 ------ LVID ES, 32.9 mm 23-38 ann, tiss DP 7 chord, Ea, med ann, 8.8 cm/s ------ PLAX tiss DP FS, chord, 36 % >29 E/Ea, med 9.4 ------ PLAX ann, tiss DP 8 LVPW, ED 11.2 mm ------ Mitral valve IVS/LVPW 0.96 <1.3 Peak E vel 83. cm/s ------ ratio, ED 4 Ventricular septum Peak A vel 74 cm/s ------ IVS, ED 10.7 mm ------ Deceleration 229 ms 150-23 Aorta time 0 Root diam, 38 mm ------ Peak 3 mm ------ ED gradient, D Hg Left atrium Peak E/A 1.1 ------ AP dim 55 mm ------ ratio AP dim 2.17 cm/m^2 <  2.2 Systemic veins index Estimated CVP 5 mm ------ Vol, S 85 ml ------ Hg Vol index, 33.5 ml/m^2 ------ Right ventricle S Sa vel, lat 19 cm/s ------ ann, tiss DP  ------------------------------------------------------------ Prepared and Electronically Authenticated by  Landry Corporal 2015-04-16T12:05:[id number]  Assessment / Plan:    1. Pericardial Effusion- will plan to do Sub Xiphoid Window today, hopefully can identify cause 2. Pulmonary Embolism- currently on Heparin, can restart Xarelto, 24 hours after surgery 3. Elevated LFTs- new this month, will need workup with medicine 4. Atrial Fibrillation, currently NSR on Cardizem 5. CHF- per Cardiology 6. Dispo- to OR today for drainage of pericardial effusion and for diagnostic biopsy and sampling for etiology, risks and benefits explained to the patient and family and he is agreeable to proceed.  The goals risks and alternatives of the planned surgical procedure subxiphoid pericardial window have been discussed with the patient in detail. The risks of the procedure including death, infection, stroke, myocardial infarction, bleeding, blood transfusion have all been discussed specifically.  I have quoted [name] a 2 % of perioperative mortality and a complication rate as high as 15 %. The patient's questions have been answered.LAINE GIOVANETTI is willing  to proceed with the planned procedure.  Grace Isaac MD      [address] [place] [address] Office [phone]   Beeper [phone]

## 2013-07-18 NOTE — Progress Notes (Signed)
[facility] PROGRESS NOTE  [name] WJX:[id number] DOB: 1946/01/03 DOA: 07/16/2013 PCP: [name], MD Interim summary: [name] is an 68 y.o. male with hx of bariatric surgery one year ago, lost about 5 lbs total before and after surgery, hx of HTN, obesity and sleep apnea, gout, depression, BPH, noted that he had a severe "viral illness" about 3 months ago, followed by a persistent but no productive cough, wheezing and shortness of breath. He was seen at the urgent care, Dx with URI, and given symptomatic Tx. He saw his PCP, who was concerned about a PE, and thus started him on Xarelto and Lasix, and he presented to the ER today with same symptoms. He said his chest pain was with movement, better with lying down and with palpation. He has no orthopnea or PND. He denied fever, chills, nausea, vomiting or diaphoresis. Evaluation in the ER showed EKG with no ST elevation, in NSR, with S1Q3 and reverse T wave in the precordial leads. A CTPA was done, and it showed a small clot burden acute pulmonary edema, with pericardial effusion along with bilateral pleural effusion. He denied rash, joint pain, family hx of rhematoid arthritis, or lupus. Hospitals[facility] was asked to admit him for PE and pericarditis.   Assessment/Plan: 1. Small subsegmental PE: - Started pt on xarelto by PCP on 4/16, but was later changed to IV heparin for convenience for surgery by cardiology.  - lower extremity dopplers and negative for DVT.  - he is hemodynamically stable and  2 D echocardiogram revealed moderate to large pericardial effusion, with dilated left atrium. LVEF is preserved.   2. Moderate pericardial effusion/ extensive pericarditis.:  - unclear etiology, idiopathic.  - echo done and cardiology consulted for recommendations. No evidence of hemodynamic compromise.  - CT surgery consulted for pericardial window.  He underwent pericardial window , and was found to have severe hemorraghic  pericarditis. Fluid sent for analysis including cytology, microbiology and culture.  - started pt on colchicine and pain medications. Will continue to monitor.  - discussed with Dr Servando Snare.   3. Leukocytosis:  - probably reactive, he denies fever and no evidence of infection .  4. Atrial fibrillation with RVR: pt went in to afib with RVR the evening on 4/16. He was started on cardizem and transferred to step down.  - his rate is better controlled, converted to sinus overnight.   5. Elevated liver function tests: - denies any abdominal pain.  Repeat levels show worsening of LFT'S but the sample is hemolysed. We will repeat another liver function panel. Continue to monitor.   - US abdomen ordered and pending.   6. Obstructive sleep apnea: - on CPAP at home.   7. Gout: - stable.   8. Hypertension: controlled.   DVT prophylaxis.   Code Status: full code Family Communication: none at bedside Disposition Plan: pending.    Consultants:  Cardiology Dr Debara Pickett.  CT surgery   Procedures:  Echocardiogram  Pericardial window  Antibiotics:  none  HPI/Subjective: Is alert awake and comfortable.   Objective: Filed Vitals:   07/18/13 0752  BP: 116/71  Pulse: 66  Temp: 98.6 F (37 C)  Resp: 24    Intake/Output Summary (Last 24 hours) at 07/18/13 0854 Last data filed at 07/18/13 0700  Gross per 24 hour  Intake 397.58 ml  Output   2750 ml  Net -2352.42 ml   Filed Weights   07/16/13 1917 07/16/13 2245 07/17/13 2031  Weight: 142.883 kg (315  lb) 137.803 kg (303 lb 12.8 oz) 138.2 kg (304 lb 10.8 oz)    Exam:   General:  Alert afebrile comfortable  Cardiovascular: s1s2  Respiratory: ctab  Abdomen: soft NT ND BS+  Musculoskeletal: trace pedal edema  Data Reviewed: Basic Metabolic Panel:  Recent Labs Lab 07/13/13 1835 07/16/13 1919 07/18/13 0405  NA 137 140 141  K 3.7 3.8 3.2*  CL 100 100 103  CO2 22 21 23   GLUCOSE 109* 105* 112*  BUN 34* 38* 38*   CREATININE 1.09 1.35 1.20  CALCIUM 9.0 8.6 8.7   Liver Function Tests:  Recent Labs Lab 07/13/13 1835  AST 95*  ALT 141*  ALKPHOS 314*  BILITOT 1.2  PROT 6.9  ALBUMIN 2.8*   No results found for this basename: LIPASE, AMYLASE,  in the last 168 hours No results found for this basename: AMMONIA,  in the last 168 hours CBC:  Recent Labs Lab 07/13/13 1730 07/16/13 1919 07/18/13 0405  WBC 13.2* 13.8* 13.0*  NEUTROABS 10.7*  --   --   HGB 12.6* 12.2* 11.4*  HCT 36.9* 36.6* 34.7*  MCV 90.7 90.8 91.3  PLT 383 340 319   Cardiac Enzymes:  Recent Labs Lab 07/15/13 0854 07/17/13 2000 07/18/13 0038 07/18/13 0727  TROPONINI 0.01 <0.30 <0.30 <0.30   BNP (last 3 results)  Recent Labs  07/13/13 1835  PROBNP 519.5*   CBG: No results found for this basename: GLUCAP,  in the last 168 hours  Recent Results (from the past 240 hour(s))  MRSA PCR SCREENING     Status: None   Collection Time    07/18/13  4:03 AM      Result Value Ref Range Status   MRSA by PCR NEGATIVE  NEGATIVE Final   Comment:            The GeneXpert MRSA Assay (FDA     approved for NASAL specimens     only), is one component of a     comprehensive MRSA colonization     surveillance program. It is not     intended to diagnose MRSA     infection nor to guide or     monitor treatment for     MRSA infections.     Studies: Ct Angio Chest Pe W/cm &/or Wo Cm  07/16/2013   CLINICAL DATA:  Shortness of breath, neck/back pain, elevated D-dimer, evaluate for PE. Lap band surgery in January.  EXAM: CT ANGIOGRAPHY CHEST WITH CONTRAST  TECHNIQUE: Multidetector CT imaging of the chest was performed using the standard protocol during bolus administration of intravenous contrast. Multiplanar CT image reconstructions and MIPs were obtained to evaluate the vascular anatomy.  CONTRAST:  77mL OMNIPAQUE IOHEXOL 350 MG/ML SOLN  COMPARISON:  None.  FINDINGS: Tiny subsegmental filling defects within branches of the right  lower lobe pulmonary artery (series 5/ images 169, 170, and 189), suspicious for pulmonary emboli. Overall clot burden is small. No findings to suggest right heart strain (RV-to-LV ratio 0.87).  Small to moderate left and small right pleural effusions. No frank interstitial edema. No pneumothorax.  Visualized right thyroid is notable for a suspected 2.2 cm thyroid nodule (series 4/ image 1), incompletely visualized.  The heart is top-normal in size. Small to moderate pericardial effusion with a mildly irregular appearance. Mild atherosclerotic calcifications of the aortic arch.  No suspicious mediastinal, hilar, or axillary lymphadenopathy.  Visualized upper abdomen is notable for a laparoscopic band in satisfactory position.  Degenerative changes of the  visualized thoracolumbar spine.  Review of the MIP images confirms the above findings.  IMPRESSION: Suspected tiny subsegmental pulmonary emboli within branches of the right lower lobe pulmonary artery. Overall clot burden is small.  Small to moderate left and small right pleural effusions. No frank interstitial edema.  Small to moderate pericardial effusion. Given the irregular appearance, correlate for pericarditis.  Critical Value/emergent results were called by telephone at the time of interpretation on 07/16/2013 at 5:28 PM to Dr. Gwyndolyn Saxon HOPPER, who verbally acknowledged these results.   Electronically Signed   By: Julian Hy M.D.   On: 07/16/2013 17:29    Scheduled Meds: . aspirin  325 mg Oral Daily  . calcium carbonate  250 mg Oral TID WC  . finasteride  5 mg Oral Daily  . furosemide  40 mg Intravenous Daily  . gabapentin  100 mg Oral BID  . LORazepam  0.5 mg Oral Daily  . LORazepam  1 mg Oral QHS  . risperiDONE  0.5 mg Oral QHS  . sertraline  150 mg Oral Q breakfast  . vitamin B-12  1,000 mcg Oral Daily   Continuous Infusions: . diltiazem (CARDIZEM) infusion 5 mg/hr (07/17/13 2041)  . heparin 1,700 Units/hr (07/18/13 0501)     Principal Problem:   CHF (congestive heart failure) Active Problems:   HYPERLIPIDEMIA   HTN (hypertension)   SLEEP APNEA   Morbid obesity   Morbid obesity with BMI of 45.0-49.9, adult   H/O laparoscopic adjustable gastric banding & hiatal hernia repair 04/08/13   Pericardial effusion   Pericarditis   Post viral syndrome   Pulmonary embolism   Hepatitis   Atrial fibrillation   Pulmonary embolus    Time spent: 25 min    Hosie Poisson  [facility] Pager [phone]  If 7PM-7AM, please contact night-coverage at [email or web address], password [facility] 07/18/2013, 8:54 AM  LOS: 2 days

## 2013-07-18 NOTE — Brief Op Note (Addendum)
      [address]       [place] [address]             [phone]      07/18/2013  1:20 PM  PATIENT:  [name]  68 y.o. male  PRE-OPERATIVE DIAGNOSIS:  pericardial effusion  POST-OPERATIVE DIAGNOSIS:  pericardial effusion with diffuse pericarditis   PROCEDURE:  Procedure(s):  SUBXYPHOID PERICARDIAL WINDOW (N/A) -Drainage of 500 ml of Blood Tinged Fluid  INTRAOPERATIVE TRANSESOPHAGEAL ECHOCARDIOGRAM (N/A)  SURGEON:  Surgeon(s) and Role: Panel 1:    * Grace Isaac, MD - Primary  Panel 2:    * Grace Isaac, MD - Primary  PHYSICIAN ASSISTANT: Ellwood Handler PA-C  ANESTHESIA:   general  EBL:  Total I/O In: -  Out: 700 [Urine:200; Other:500]  BLOOD ADMINISTERED:none  DRAINS: [place] in Pericardium   LOCAL MEDICATIONS USED:  NONE  SPECIMEN:  Source of Specimen:  Pericardial Fluid, Pericardium, Pericardial debris  DISPOSITION OF SPECIMEN:  pathology, cytology, microbiology  COUNTS:  YES   DICTATION: .Dragon Dictation  PLAN OF CARE: Admit to inpatient   PATIENT DISPOSITION:  PACU - hemodynamically stable.   Delay start of Pharmacological VTE agent (>24hrs) due to surgical blood loss or risk of bleeding: already has had PE will resume heparin with no bolus in 6 hours

## 2013-07-18 NOTE — Anesthesia Postprocedure Evaluation (Signed)
  Anesthesia Post-op Note  Patient: [name]  Procedure(s) Performed: Procedure(s): SUBXYPHOID PERICARDIAL WINDOW (N/A) INTRAOPERATIVE TRANSESOPHAGEAL ECHOCARDIOGRAM (N/A)  Patient Location: PACU  Anesthesia Type:General  Level of Consciousness: awake, alert , oriented and patient cooperative  Airway and Oxygen Therapy: Patient Spontanous Breathing  Post-op Pain: mild  Post-op Assessment: Post-op Vital signs reviewed, Patient's Cardiovascular Status Stable, Respiratory Function Stable, Patent Airway, No signs of Nausea or vomiting and Pain level controlled  Post-op Vital Signs: stable  Last Vitals:  Filed Vitals:   07/18/13 1445  BP: 95/52  Pulse: 59  Temp:   Resp: 30    Complications: No apparent anesthesia complications

## 2013-07-18 NOTE — Progress Notes (Signed)
[place] for Heparin Indication: atrial fibrillation and pulmonary embolus  Allergies  Allergen Reactions  . Propoxyphene N-Acetaminophen Nausea Only  . Codeine     nausea  . Morphine     Nausea Severe hallucinations  . Penicillins     LOC with shot    Patient Measurements: Height: 6' (182.9 cm) Weight: 304 lb 10.8 oz (138.2 kg) IBW/kg (Calculated) : 77.6 Heparin Dosing Weight:   Vital Signs: Temp: 98.3 F (36.8 C) (04/17 1500) Temp src: Oral (04/17 0752) BP: 93/67 mmHg (04/17 1500) Pulse Rate: 59 (04/17 1500)  Labs:  Recent Labs  07/16/13 1919 07/17/13 2000 07/18/13 0038 07/18/13 0405 07/18/13 0727  HGB 12.2*  --   --  11.4*  --   HCT 36.6*  --   --  34.7*  --   PLT 340  --   --  319  --   APTT  --   --   --  41*  --   HEPARINUNFRC  --   --   --  >2.20*  --   CREATININE 1.35  --   --  1.20  --   TROPONINI  --  <0.30 <0.30  --  <0.30    Estimated Creatinine Clearance: 86 ml/min (by C-G formula based on Cr of 1.2).   Medical History: Past Medical History  Diagnosis Date  . Benign prostatic hypertrophy   . Hypertension   . Gout   . Depression   . FH: colonic polyps   . Morbid obesity   . Complication of anesthesia     MORPHINE CAUSES HALLUCINATIONS  . Asthma     childhood asthma  . Numbness     feet / legs  . Cancer   . History of kidney stones   . Kidney cysts   . Sleep apnea     USES C PAP  . Bilateral hip pain    Assessment: 68yo man s/p Lap Band procedure on Xarelto for (+)PE, now with AFib and pericardial effusion of mod-large size & with need for a pericardial window, to switch to Heparin.  His last dose of Xarelto was at [facility] 4/16; heparin off this am for window. Orders to restart tonight 6h post procedure without bolus.  Goal of Therapy:  Heparin level 0.3-0.7 units/ml aPTT 66-102 seconds Monitor platelets by anticoagulation protocol: Yes   Plan:  1.Restart heparin at 1700/hr 2.Aptt 8h after  restart 3.Follow up for appropriate time to resume [place] PharmD., BCPS Clinical Pharmacist Pager [phone] 07/18/2013 4:12 PM

## 2013-07-18 NOTE — Anesthesia Preprocedure Evaluation (Addendum)
Anesthesia Evaluation  Patient identified by MRN, date of birth, ID band Patient awake    Reviewed: Allergy & Precautions, H&P , NPO status , Patient's Chart, lab work & pertinent test results  Airway       Dental   Pulmonary asthma , sleep apnea , former smoker,          Cardiovascular hypertension, +CHF     Neuro/Psych  Neuromuscular disease    GI/Hepatic (+) Hepatitis -, C  Endo/Other  Morbid obesity  Renal/GU Renal InsufficiencyRenal disease     Musculoskeletal   Abdominal   Peds  Hematology   Anesthesia Other Findings gout  Reproductive/Obstetrics                          Anesthesia Physical Anesthesia Plan  ASA: IV and emergent  Anesthesia Plan: General   Post-op Pain Management:    Induction: Intravenous  Airway Management Planned: Oral ETT  Additional Equipment: Arterial line and CVP  Intra-op Plan:   Post-operative Plan: Possible Post-op intubation/ventilation  Informed Consent: I have reviewed the patients History and Physical, chart, labs and discussed the procedure including the risks, benefits and alternatives for the proposed anesthesia with the patient or authorized representative who has indicated his/her understanding and acceptance.     Plan Discussed with:   Anesthesia Plan Comments:         Anesthesia Quick Evaluation

## 2013-07-18 NOTE — Anesthesia Procedure Notes (Signed)
Procedure Name: Intubation Date/Time: 07/18/2013 11:59 AM Performed by: Rogers Blocker Pre-anesthesia Checklist: Patient identified, Timeout performed, Emergency Drugs available and Suction available Patient Re-evaluated:Patient Re-evaluated prior to inductionOxygen Delivery Method: Circle system utilized Preoxygenation: Pre-oxygenation with 100% oxygen Intubation Type: IV induction and Rapid sequence Ventilation: Mask ventilation without difficulty Laryngoscope Size: Mac and 4 Grade View: Grade I Tube type: Oral Tube size: 8.0 mm Number of attempts: 1 Placement Confirmation: ETT inserted through vocal cords under direct vision,  positive ETCO2 and breath sounds checked- equal and bilateral Secured at: 23 cm Tube secured with: Tape Dental Injury: Teeth and Oropharynx as per pre-operative assessment

## 2013-07-18 NOTE — Care Management Note (Addendum)
  Page 1 of 1   07/25/2013     4:29:05 PM CARE MANAGEMENT NOTE 07/25/2013  Patient:  DESMAN, POLAK A   Account Number:  [id number]  Date Initiated:  07/18/2013  Documentation initiated by:  Elissa Hefty  Subjective/Objective Assessment:   adm w pul embolus     Action/Plan:   lives w wife, pcp dr Gwyndolyn Saxon hopper   Anticipated DC Date:  07/25/2013   Anticipated DC Plan:  HOME/SELF CARE         Choice offered to / List presented to:             Status of service:  Completed, signed off Medicare Important Message given?   (If response is "NO", the following Medicare IM given date fields will be blank) Date Medicare IM given:   Date Additional Medicare IM given:    Discharge Disposition:  HOME/SELF CARE  Per UR Regulation:  Reviewed for med. necessity/level of care/duration of stay  If discussed at [place] of Stay Meetings, dates discussed:   07/22/2013  07/24/2013    Comments:  PT RECS:  No PT follow up;Other (comment) (Cardiac rehab) DME Recs:  None CM identifies no other CM needs. Mindie Rawdon RN, BSN, [place], [place] 07/25/2013

## 2013-07-18 NOTE — Transfer of Care (Signed)
Immediate Anesthesia Transfer of Care Note  Patient: [name]  Procedure(s) Performed: Procedure(s): SUBXYPHOID PERICARDIAL WINDOW (N/A) INTRAOPERATIVE TRANSESOPHAGEAL ECHOCARDIOGRAM (N/A)  Patient Location: PACU  Anesthesia Type:General  Level of Consciousness: awake, alert , oriented and patient cooperative  Airway & Oxygen Therapy: Patient Spontanous Breathing and Patient connected to face mask oxygen  Post-op Assessment: Report given to PACU RN, Post -op Vital signs reviewed and stable and Patient moving all extremities X 4  Post vital signs: Reviewed and stable  Complications: No apparent anesthesia complications

## 2013-07-18 NOTE — Clinical Documentation Improvement (Signed)
PLEASE SPECIFY TYPE AND ACUITY OF CHF Possible Clinical Conditions?  Chronic Systolic Congestive Heart Failure Chronic Diastolic Congestive Heart Failure Chronic Systolic & Diastolic Congestive Heart Failure Acute Systolic Congestive Heart Failure Acute Diastolic Congestive Heart Failure Acute Systolic & Diastolic Congestive Heart Failure Acute on Chronic Systolic Congestive Heart Failure Acute on Chronic Diastolic Congestive Heart Failure Acute on Chronic Systolic & Diastolic Congestive Heart Failure Other Condition Cannot Clinically Determine  Supporting Information: Risk Factors:(As per notes) "PE, CHF, Acute Pulmonary Edema  Diagnostics: ECHO 07-18-15 Study Conclusions - Left ventricle: The cavity size was normal. There was mild concentric hypertrophy. Systolic function was normal. The estimated ejection fraction was in the range of 55% to 60%. Wall motion was normal; there were no regional wall motion abnormalities. - Aortic root: The aortic root was mildly &dilated. - Mitral valve: Mild regurgitation. - Left atrium: The atrium was moderately to severely &dilated. - Right atrium: The atrium was mildly to moderately &dilated. - Pericardium, extracardiac: A moderate to large pericardial effusion was identified. There was no evidence of hemodynamic compromise  Thank You, Alessandra Grout, RN, BSN, CCDS, Clinical Documentation Specialist:  [phone]   [phone]=[facility] [place]- Health Information Management

## 2013-07-19 DIAGNOSIS — I4891 Unspecified atrial fibrillation: Secondary | ICD-10-CM

## 2013-07-19 LAB — CBC
HEMATOCRIT: 32.4 % — AB (ref 39.0–52.0)
Hemoglobin: 10.7 g/dL — ABNORMAL LOW (ref 13.0–17.0)
MCH: 30.2 pg (ref 26.0–34.0)
MCHC: 33 g/dL (ref 30.0–36.0)
MCV: 91.5 fL (ref 78.0–100.0)
PLATELETS: 323 10*3/uL (ref 150–400)
RBC: 3.54 MIL/uL — ABNORMAL LOW (ref 4.22–5.81)
RDW: 14.5 % (ref 11.5–15.5)
WBC: 13.8 10*3/uL — ABNORMAL HIGH (ref 4.0–10.5)

## 2013-07-19 LAB — COMPREHENSIVE METABOLIC PANEL
ALK PHOS: 413 U/L — AB (ref 39–117)
ALT: 139 U/L — ABNORMAL HIGH (ref 0–53)
AST: 106 U/L — ABNORMAL HIGH (ref 0–37)
Albumin: 2.4 g/dL — ABNORMAL LOW (ref 3.5–5.2)
BUN: 36 mg/dL — AB (ref 6–23)
CO2: 23 meq/L (ref 19–32)
Calcium: 8.3 mg/dL — ABNORMAL LOW (ref 8.4–10.5)
Chloride: 104 mEq/L (ref 96–112)
Creatinine, Ser: 1.08 mg/dL (ref 0.50–1.35)
GFR calc non Af Amer: 69 mL/min — ABNORMAL LOW (ref >90)
GFR, EST AFRICAN AMERICAN: 80 mL/min — AB (ref >90)
GLUCOSE: 175 mg/dL — AB (ref 70–99)
POTASSIUM: 4.8 meq/L (ref 3.7–5.3)
SODIUM: 139 meq/L (ref 137–147)
TOTAL PROTEIN: 6.3 g/dL (ref 6.0–8.3)
Total Bilirubin: 0.9 mg/dL (ref 0.3–1.2)

## 2013-07-19 LAB — APTT: APTT: 47 s — AB (ref 24–37)

## 2013-07-19 MED ORDER — METOPROLOL SUCCINATE 12.5 MG HALF TABLET
12.5000 mg | ORAL_TABLET | Freq: Every day | ORAL | Status: DC
  Administered 2013-07-20 – 2013-07-21 (×2): 12.5 mg via ORAL
  Filled 2013-07-19 (×3): qty 1

## 2013-07-19 MED ORDER — TAB-A-VITE/IRON PO TABS
1.0000 | ORAL_TABLET | Freq: Every day | ORAL | Status: DC
  Administered 2013-07-19 – 2013-07-25 (×7): 1 via ORAL
  Filled 2013-07-19 (×7): qty 1

## 2013-07-19 MED ORDER — RIVAROXABAN 15 MG PO TABS
15.0000 mg | ORAL_TABLET | Freq: Two times a day (BID) | ORAL | Status: DC
  Administered 2013-07-19 – 2013-07-21 (×5): 15 mg via ORAL
  Filled 2013-07-19 (×6): qty 1

## 2013-07-19 MED ORDER — METOPROLOL SUCCINATE 12.5 MG HALF TABLET
12.5000 mg | ORAL_TABLET | Freq: Every day | ORAL | Status: DC
  Filled 2013-07-19: qty 1

## 2013-07-19 MED ORDER — RIVAROXABAN 15 MG PO TABS
15.0000 mg | ORAL_TABLET | Freq: Two times a day (BID) | ORAL | Status: DC
Start: 2013-07-20 — End: 2013-07-19
  Filled 2013-07-19: qty 1

## 2013-07-19 MED ORDER — RIVAROXABAN 15 MG PO TABS
15.0000 mg | ORAL_TABLET | Freq: Two times a day (BID) | ORAL | Status: DC
  Filled 2013-07-19 (×2): qty 1

## 2013-07-19 MED ORDER — DILTIAZEM HCL 30 MG PO TABS
30.0000 mg | ORAL_TABLET | Freq: Three times a day (TID) | ORAL | Status: DC
  Filled 2013-07-19 (×3): qty 1

## 2013-07-19 NOTE — Op Note (Signed)
NAMECYPRESS, FANFAN NO.:  [id number]  MEDICAL RECORD NO.:  [id number]  LOCATION:  2H11C                        FACILITY:  [facility]  PHYSICIAN:  Lanelle Bal, MD    DATE OF BIRTH:  [birth year]  DATE OF PROCEDURE:  07/18/2013 DATE OF DISCHARGE:                              OPERATIVE REPORT   PREOPERATIVE DIAGNOSIS:  Pericardial effusion.  POSTOPERATIVE DIAGNOSIS:  Pericardial effusion with evidence of diffuse pericarditis.  PROCEDURE PERFORMED:  Subxiphoid pericardial window and drainage of pericardial effusion.  SURGEON:  Lanelle Bal, MD  FIRST ASSISTANT:  Providence Crosby, PA  BRIEF HISTORY:  The patient is a 68 year old male who presents with 2- month history of increasing symptoms including deterioration and elevated liver enzymes.  Most recently, he had increasing shortness of breath and chest discomfort and was found to have a small pulmonary embolus.  He was admitted and echocardiogram revealed with the report described as large pericardial effusion but without tamponade.  Cardiac Surgery consultation was obtained via Cardiology for consideration of pericardial window.  Seeing the patient, he had no evidence of tamponade or hemodynamic consequence, he had been on Xarelto for several days and transitioned to IV heparin.  However, after review of the patient's CT scan of the chest and echocardiogram, he had evidence of at least a moderate pericardial effusion, thickening of the pericardium and both were therapeutic but also for diagnostic reasons subxiphoid pericardial window was recommended to the patient.  He agreed and signed informed consent.  DESCRIPTION OF PROCEDURE:  With central line and arterial line in place, the patient underwent general endotracheal anesthesia without incident. Appropriate time-out was performed.  The chest was prepped with Betadine and draped in sterile manner.  A subxiphoid incision was made. Dissection was carried  down to the pericardium.  It should be noted that a TEE probe was placed into the esophagus but not into the stomach because of his previous lap band by Dr. Sherren Kerns.  The pericardium was identified and was opened.  There was serosanguineous pericardial effusion, a total of approximately 400 mL.  In addition, the pericardium was noted to be very thick and there was exudate of proteinaceous debris and obvious inflammation in both the pericardium and epicardium. Portion of the pericardium was submitted to Pathology.  The fluid was removed both for cell count, culture including viral cultures.  A 28 Blake chest tube drain was left in the cardiac space.  The incision was then closed with interrupted 0 Vicryl in the fascial layer, running 2-0 Vicryl in subcutaneous tissue, 3-0 subcuticular stitch in skin edges. Dermabond was placed.  The patient was awakened and extubated in the operating room and transferred to the recovery room for postoperative care.  He tolerated the procedure without obvious complication.  Other than the pericardial fluid drained, blood loss was minimal.  The sponge and needle count was reported as correct at the completion of procedure.     Lanelle Bal, MD     EG/MEDQ  D:  07/19/2013  T:  07/19/2013  Job:  [id number]

## 2013-07-19 NOTE — Progress Notes (Addendum)
[facility] DAILY ICU PROGRESS NOTE                   [address]            [place] [address]          [phone]   1 Day Post-Op Procedure(s) (LRB): SUBXYPHOID PERICARDIAL WINDOW (N/A) INTRAOPERATIVE TRANSESOPHAGEAL ECHOCARDIOGRAM (N/A)  Total Length of Stay:  LOS: 3 days   Subjective: Feels better, not SOB, some soreness   Objective: Vital signs in last 24 hours: Temp:  [95.9 F (35.5 C)-98.7 F (37.1 C)] 95.9 F (35.5 C) (04/18 0757) Pulse Rate:  [46-71] 50 (04/18 0757) Cardiac Rhythm:  [-] Sinus bradycardia (04/18 0400) Resp:  [13-32] 17 (04/18 0757) BP: (87-107)/(44-67) 95/51 mmHg (04/18 0757) SpO2:  [84 %-100 %] 100 % (04/18 0757) Arterial Line BP: (95-114)/(39-49) 114/48 mmHg (04/18 0700)  Filed Weights   07/16/13 1917 07/16/13 2245 07/17/13 2031  Weight: 315 lb (142.883 kg) 303 lb 12.8 oz (137.803 kg) 304 lb 10.8 oz (138.2 kg)    Weight change:    Hemodynamic parameters for last 24 hours:    Intake/Output from previous day: 04/17 0701 - 04/18 0700 In: 2476 [P.O.:340; I.V.:2136] Out: 1249 [Urine:725; Chest Tube:24]  Intake/Output this shift:    Current Meds: Scheduled Meds: . calcium carbonate  250 mg Oral TID WC  . colchicine  0.6 mg Oral BID  . finasteride  5 mg Oral Daily  . furosemide  40 mg Intravenous Daily  . gabapentin  100 mg Oral BID  . LORazepam  0.5 mg Oral Daily  . LORazepam  1 mg Oral QHS  . risperiDONE  0.5 mg Oral QHS  . sertraline  150 mg Oral Q breakfast  . vitamin B-12  1,000 mcg Oral Daily   Continuous Infusions: . dextrose 5 % and 0.45% NaCl 50 mL/hr at 07/18/13 1630  . diltiazem (CARDIZEM) infusion 5 mg/hr (07/18/13 2000)  . heparin 1,900 Units/hr (07/19/13 0524)   PRN Meds:.fentaNYL, HYDROmorphone (DILAUDID) injection, ketorolac, ondansetron (ZOFRAN) IV, ondansetron  General appearance: alert, cooperative and no distress Heart: regular rate and rhythm Lungs: clear to auscultation bilaterally Abdomen:  benign Extremities: + LE edema Wound: dressing CDI  Lab Results: CBC: Recent Labs  07/18/13 0405 07/19/13 0420  WBC 13.0* 13.8*  HGB 11.4* 10.7*  HCT 34.7* 32.4*  PLT 319 323   BMET:  Recent Labs  07/18/13 0405 07/19/13 0420  NA 141 139  K 3.2* 4.8  CL 103 104  CO2 23 23  GLUCOSE 112* 175*  BUN 38* 36*  CREATININE 1.20 1.08  CALCIUM 8.7 8.3*    PT/INR: No results found for this basename: LABPROT, INR,  in the last 72 hours Radiology: Dg Chest Port 1 View  07/18/2013   CLINICAL DATA:  Cough.  Status post pericardial window procedure.  EXAM: PORTABLE CHEST - 1 VIEW  COMPARISON:  CT ANGIO CHEST W/CM &/OR WO/CM dated 07/16/2013; DG CHEST 2 VIEW dated 07/15/2013  FINDINGS: Right IJ line tip: Upper SVC. Moderately enlarged cardiopericardial silhouette. Indistinct pulmonary vasculature compatible with pulmonary venous hypertension.  Right costophrenic angle excluded. No pneumothorax or significant pneumomediastinum observed. Retrocardiac density suggests mild atelectasis in the left lower lobe.  IMPRESSION: 1. Continued enlargement of the cardiopericardial silhouette, with underlying pulmonary venous hypertension. 2. Mild atelectasis in the left lower lobe. 3. Right IJ line tip:  Upper SVC.   Electronically Signed   By: Sherryl Barters M.D.   On: 07/18/2013  15:01     Assessment/Plan: S/P Procedure(s) (LRB): SUBXYPHOID PERICARDIAL WINDOW (N/A) INTRAOPERATIVE TRANSESOPHAGEAL ECHOCARDIOGRAM (N/A)  1 doing well 2 not much drainage- keep tube for now 3 mild stable leukocytosis, H/H shows slow steady decline- monitor, no surgical bleeding  4 renal fxn improving- conts diuretic 5 will get CXR in am 6 await path/cultures 7 afib/medical management as per cardiology/primary service  John Giovanni 07/19/2013 8:20 AM  I have seen and examined the patient and agree with the assessment and plan as outlined.  Rexene Alberts 07/19/2013 10:38 AM

## 2013-07-19 NOTE — Progress Notes (Signed)
ANTICOAGULATION CONSULT NOTE   Pharmacy Consult for Heparin Indication: atrial fibrillation and pulmonary embolus  Patient Measurements: Height: 6' (182.9 cm) Weight: 304 lb 10.8 oz (138.2 kg) IBW/kg (Calculated) : 77.6 Heparin Dosing Weight:   Vital Signs: Temp: 95.9 F (35.5 C) (04/18 0757) Temp src: Axillary (04/18 0757) BP: 95/51 mmHg (04/18 0757) Pulse Rate: 50 (04/18 0757)  Labs:  Recent Labs  07/16/13 1919 07/17/13 2000 07/18/13 0038 07/18/13 0405 07/18/13 0727 07/19/13 0420  HGB 12.2*  --   --  11.4*  --  10.7*  HCT 36.6*  --   --  34.7*  --  32.4*  PLT 340  --   --  319  --  323  APTT  --   --   --  41*  --  47*  HEPARINUNFRC  --   --   --  >2.20*  --   --   CREATININE 1.35  --   --  1.20  --  1.08  TROPONINI  --  <0.30 <0.30  --  <0.30  --     Estimated Creatinine Clearance: 95.6 ml/min (by C-G formula based on Cr of 1.08).   Medical History: Past Medical History  Diagnosis Date  . Benign prostatic hypertrophy   . Hypertension   . Gout   . Depression   . FH: colonic polyps   . Morbid obesity   . Complication of anesthesia     MORPHINE CAUSES HALLUCINATIONS  . Asthma     childhood asthma  . Numbness     feet / legs  . Cancer   . History of kidney stones   . Kidney cysts   . Sleep apnea     USES C PAP  . Bilateral hip pain    Assessment: 68yo man s/p Lap Band procedure on Xarelto for (+)PE, now with AFib and pericardial effusion of mod-large size switched to Heparin for procedure.  His last dose of Xarelto was at [facility] 4/16. Patient now s/p pericardial windown 4/17, heparin restarted post procedure. Orders received to transition back to xarelto this evening at least 24 hours post procedure, will schedule dose with dinner. Will turn off heparin at time of xarelto administration.  Goal of Therapy:  Heparin level 0.3-0.7 units/ml aPTT 66-102 seconds Monitor platelets by anticoagulation protocol: Yes   Plan:  1.Stop heparin tonight and give  xarelto 15mg  - start bid dosing in am 2.D/c daily aptt/HL  Erin Hearing PharmD., BCPS Clinical Pharmacist Pager [phone] 07/19/2013 8:16 AM

## 2013-07-19 NOTE — Progress Notes (Signed)
ANTICOAGULATION CONSULT NOTE - Follow Up Consult  Pharmacy Consult for Heparin  Indication: atrial fibrillation and pulmonary embolus  Allergies  Allergen Reactions  . Propoxyphene N-Acetaminophen Nausea Only  . Codeine     nausea  . Morphine     Nausea Severe hallucinations  . Penicillins     LOC with shot    Patient Measurements: Height: 6' (182.9 cm) Weight: 304 lb 10.8 oz (138.2 kg) IBW/kg (Calculated) : 77.6  Vital Signs: Temp: 97.4 F (36.3 C) (04/18 0413) Temp src: Axillary (04/18 0413) BP: 95/54 mmHg (04/18 0413) Pulse Rate: 47 (04/18 0413)  Labs:  Recent Labs  07/16/13 1919 07/17/13 2000 07/18/13 0038 07/18/13 0405 07/18/13 0727 07/19/13 0420  HGB 12.2*  --   --  11.4*  --  10.7*  HCT 36.6*  --   --  34.7*  --  32.4*  PLT 340  --   --  319  --  323  APTT  --   --   --  41*  --  47*  HEPARINUNFRC  --   --   --  >2.20*  --   --   CREATININE 1.35  --   --  1.20  --  1.08  TROPONINI  --  <0.30 <0.30  --  <0.30  --     Estimated Creatinine Clearance: 95.6 ml/min (by C-G formula based on Cr of 1.08).   Medications:  Heparin 1700 units/hr  Assessment: 68 y/o M on heparin for PE (Xarelto PTA on hold), now with afib as well. aPTT is 47 this AM. Other labs as above. No issues per RN.   Goal of Therapy:  Heparin level 0.3-0.7 units/ml aPTT 66-102 seconds Monitor platelets by anticoagulation protocol: Yes   Plan:  -Increase heparin drip to 1900 units/hr -1200 aPTT -Daily CBC/HL/aPTT -Monitor for bleeding  Narda Bonds 07/19/2013,5:19 AM

## 2013-07-19 NOTE — Progress Notes (Signed)
Patient ID: [name], male   DOB: 20-Oct-1945, 68 y.o.   MRN: [id number]   SUBJECTIVE: Patient is back in NSR today.  S/p pericardial window yesterday.  No complaints this morning, no dyspnea.   Scheduled Meds: . calcium carbonate  250 mg Oral TID WC  . colchicine  0.6 mg Oral BID  . finasteride  5 mg Oral Daily  . furosemide  40 mg Intravenous Daily  . gabapentin  100 mg Oral BID  . LORazepam  0.5 mg Oral Daily  . LORazepam  1 mg Oral QHS  . metoprolol succinate  12.5 mg Oral Daily  . multivitamins with iron  1 tablet Oral Daily  . risperiDONE  0.5 mg Oral QHS  . sertraline  150 mg Oral Q breakfast  . vitamin B-12  1,000 mcg Oral Daily   Continuous Infusions: . dextrose 5 % and 0.45% NaCl 50 mL/hr at 07/19/13 0800  . heparin 1,900 Units/hr (07/19/13 0800)   PRN Meds:.fentaNYL, HYDROmorphone (DILAUDID) injection, ketorolac, ondansetron (ZOFRAN) IV, ondansetron    Filed Vitals:   07/19/13 0700 07/19/13 0757 07/19/13 0800 07/19/13 0900  BP: 95/51 95/51 110/50 102/48  Pulse: 48 50 55 49  Temp:  95.9 F (35.5 C)    TempSrc:  Axillary    Resp: 22 17 19 15   Height:      Weight:      SpO2: 100% 100% 100% 95%    Intake/Output Summary (Last 24 hours) at 07/19/13 0955 Last data filed at 07/19/13 0930  Gross per 24 hour  Intake 2866.5 ml  Output   1599 ml  Net 1267.5 ml    LABS: Basic Metabolic Panel:  Recent Labs  07/18/13 0405 07/19/13 0420  NA 141 139  K 3.2* 4.8  CL 103 104  CO2 23 23  GLUCOSE 112* 175*  BUN 38* 36*  CREATININE 1.20 1.08  CALCIUM 8.7 8.3*   Liver Function Tests:  Recent Labs  07/18/13 1800 07/19/13 0420  AST 139* 106*  ALT 157* 139*  ALKPHOS 461* 413*  BILITOT 1.0 0.9  PROT 6.5 6.3  ALBUMIN 2.5* 2.4*   No results found for this basename: LIPASE, AMYLASE,  in the last 72 hours CBC:  Recent Labs  07/18/13 0405 07/19/13 0420  WBC 13.0* 13.8*  HGB 11.4* 10.7*  HCT 34.7* 32.4*  MCV 91.3 91.5  PLT 319 323   Cardiac  Enzymes:  Recent Labs  07/17/13 2000 07/18/13 0038 07/18/13 0727  TROPONINI <0.30 <0.30 <0.30   BNP: No components found with this basename: POCBNP,  D-Dimer: No results found for this basename: DDIMER,  in the last 72 hours Hemoglobin A1C: No results found for this basename: HGBA1C,  in the last 72 hours Fasting Lipid Panel: No results found for this basename: CHOL, HDL, LDLCALC, TRIG, CHOLHDL, LDLDIRECT,  in the last 72 hours Thyroid Function Tests: No results found for this basename: TSH, T4TOTAL, FREET3, T3FREE, THYROIDAB,  in the last 72 hours Anemia Panel: No results found for this basename: VITAMINB12, FOLATE, FERRITIN, TIBC, IRON, RETICCTPCT,  in the last 72 hours  RADIOLOGY: Dg Chest 2 View  07/15/2013   CLINICAL DATA:  Dyspnea and chest pain.  EXAM: CHEST  2 VIEW  COMPARISON:  07/13/2013 and 09/26/2012  FINDINGS: Lungs are adequately inflated with continued evidence of a small left pleural effusion with possible slight interval improvement. There is mild stable cardiomegaly. Remainder the exam is unchanged.  IMPRESSION: Continued small left pleural effusion with possible slight interval  improvement.   Electronically Signed   By: Marin Olp M.D.   On: 07/15/2013 11:32   Dg Chest 2 View  07/13/2013   CLINICAL DATA:  Weakness and chest pain.  EXAM: CHEST - 2 VIEW  COMPARISON:  DG CHEST 2 VIEW dated 09/26/2012; DG THORACIC SPINE dated 01/19/2011  FINDINGS: The heart is mildly enlarged. Mild interstitial and pulmonary vascular prominence identified without evidence of overt edema. There is a small left pleural effusion. Bony structures show stable mild degenerative disease of the thoracic spine.  IMPRESSION: Cardiomegaly and pulmonary interstitial prominence with small left pleural effusion. No overt edema is identified   Electronically Signed   By: Aletta Edouard M.D.   On: 07/13/2013 18:16   Ct Angio Chest Pe W/cm &/or Wo Cm  07/16/2013   CLINICAL DATA:  Shortness of breath,  neck/back pain, elevated D-dimer, evaluate for PE. Lap band surgery in January.  EXAM: CT ANGIOGRAPHY CHEST WITH CONTRAST  TECHNIQUE: Multidetector CT imaging of the chest was performed using the standard protocol during bolus administration of intravenous contrast. Multiplanar CT image reconstructions and MIPs were obtained to evaluate the vascular anatomy.  CONTRAST:  24mL OMNIPAQUE IOHEXOL 350 MG/ML SOLN  COMPARISON:  None.  FINDINGS: Tiny subsegmental filling defects within branches of the right lower lobe pulmonary artery (series 5/ images 169, 170, and 189), suspicious for pulmonary emboli. Overall clot burden is small. No findings to suggest right heart strain (RV-to-LV ratio 0.87).  Small to moderate left and small right pleural effusions. No frank interstitial edema. No pneumothorax.  Visualized right thyroid is notable for a suspected 2.2 cm thyroid nodule (series 4/ image 1), incompletely visualized.  The heart is top-normal in size. Small to moderate pericardial effusion with a mildly irregular appearance. Mild atherosclerotic calcifications of the aortic arch.  No suspicious mediastinal, hilar, or axillary lymphadenopathy.  Visualized upper abdomen is notable for a laparoscopic band in satisfactory position.  Degenerative changes of the visualized thoracolumbar spine.  Review of the MIP images confirms the above findings.  IMPRESSION: Suspected tiny subsegmental pulmonary emboli within branches of the right lower lobe pulmonary artery. Overall clot burden is small.  Small to moderate left and small right pleural effusions. No frank interstitial edema.  Small to moderate pericardial effusion. Given the irregular appearance, correlate for pericarditis.  Critical Value/emergent results were called by telephone at the time of interpretation on 07/16/2013 at 5:28 PM to Dr. Gwyndolyn Saxon HOPPER, who verbally acknowledged these results.   Electronically Signed   By: Julian Hy M.D.   On: 07/16/2013 17:29    US Abdomen Limited  07/13/2013   CLINICAL DATA:  Increased liver function tests, right upper quadrant pain  EXAM: US ABDOMEN LIMITED - RIGHT UPPER QUADRANT  COMPARISON:  None.  FINDINGS: Gallbladder:  Surgically at  Common bile duct:  Diameter: 7 mm  Liver:  No focal abnormalities.  IMPRESSION: No acute abnormalities   Electronically Signed   By: Skipper Cliche M.D.   On: 07/13/2013 20:37   Dg Chest Port 1 View  07/18/2013   CLINICAL DATA:  Cough.  Status post pericardial window procedure.  EXAM: PORTABLE CHEST - 1 VIEW  COMPARISON:  CT ANGIO CHEST W/CM &/OR WO/CM dated 07/16/2013; DG CHEST 2 VIEW dated 07/15/2013  FINDINGS: Right IJ line tip: Upper SVC. Moderately enlarged cardiopericardial silhouette. Indistinct pulmonary vasculature compatible with pulmonary venous hypertension.  Right costophrenic angle excluded. No pneumothorax or significant pneumomediastinum observed. Retrocardiac density suggests mild atelectasis in the left lower  lobe.  IMPRESSION: 1. Continued enlargement of the cardiopericardial silhouette, with underlying pulmonary venous hypertension. 2. Mild atelectasis in the left lower lobe. 3. Right IJ line tip:  Upper SVC.   Electronically Signed   By: Sherryl Barters M.D.   On: 07/18/2013 15:01    PHYSICAL EXAM General: NAD Neck: Thick, no JVD, no thyromegaly or thyroid nodule.  Lungs: Clear to auscultation bilaterally with normal respiratory effort. CV: Nondisplaced PMI.  Heart regular S1/S2, no S3/S4, no murmur.  Trace ankle edema. Abdomen: Soft, nontender, no hepatosplenomegaly, no distention.  Neurologic: Alert and oriented x 3.  Psych: Normal affect. Extremities: No clubbing or cyanosis.   TELEMETRY: Reviewed telemetry pt in NSR  ASSESSMENT AND PLAN: 68 yo with history of HTN s/p lap band in 1/15 was admitted with atrial fibrillation/RVR and the finding of a pulmonary embolus.  CTA that found PE also showed a moderate to large pericardial effusion.  Echo showed no  definite tamponade, but patient had pericardial window yesterday for diagnostic and therapeutic purposes.   1. Atrial fibrillation: Now back in sinus bradycardia.  This may be secondary to PE or pericardial effusion.   - He is on heparin gtt now for anticoagulation, may transition to Xarelto > 24 hours after surgery (this evening).   - Stop diltiazem for now, will give Toprol XL 12.5 mg daily only given bradycardia.  2. Pericardial effusion: Etiology is uncertain. Pericardial effusion initially noted on CTA when PE was detected.  Possible prior pericarditis (severe presumed viral infection several months ago), ?worsened by anticoagulation for PE.  Pericardial window yesterday for diagnosis and treatment.  Gram stain negative, pathology pending.  3. Pulmonary embolus: Again, etiology uncertain.  Had surgery but back in 1/15.  So far hypercoagulable workup negative.  Plan to go back on Xarelto tonight.  4. CHF: Acute diastolic CHF, on IV Lasix.  Will check CVP (difficult exam for volume) to see if we can switch to po/stop.   May go to step-down.   Larey Dresser 07/19/2013 10:02 AM

## 2013-07-19 NOTE — Progress Notes (Signed)
[facility] PROGRESS NOTE  [name] KZS:[id number] DOB: [birth year] DOA: 07/16/2013 PCP: [name], MD Interim summary: [name] is an 68 y.o. male with hx of bariatric surgery one year ago, lost about 53 lbs total before and after surgery, hx of HTN, obesity and sleep apnea, gout, depression, BPH, noted that he had a severe "viral illness" about 3 months ago, followed by a persistent but no productive cough, wheezing and shortness of breath. He was seen at the urgent care, Dx with URI, and given symptomatic Tx. He saw his PCP, who was concerned about a PE, and thus started him on Xarelto and Lasix, and he presented to the ER today with same symptoms. He said his chest pain was with movement, better with lying down and with palpation. He has no orthopnea or PND. He denied fever, chills, nausea, vomiting or diaphoresis. Evaluation in the ER showed EKG with no ST elevation, in NSR, with S1Q3 and reverse T wave in the precordial leads. A CTPA was done, and it showed a small clot burden acute pulmonary edema, with pericardial effusion along with bilateral pleural effusion. He denied rash, joint pain, family hx of rhematoid arthritis, or lupus. [facility] admit him for PE and pericarditis.   Assessment/Plan: 1. Small subsegmental PE: - Started pt on xarelto by PCP on 4/16, but was later changed to IV heparin for convenience for surgery by cardiology.  - lower extremity dopplers and negative for DVT.  - he is hemodynamically stable and  2 D echocardiogram revealed moderate to large pericardial effusion, with dilated left atrium. LVEF is preserved.   2. Moderate pericardial effusion/ extensive pericarditis.:  - unclear etiology, idiopathic.  - echo done and cardiology consulted for recommendations. No evidence of hemodynamic compromise.  - CT surgery consulted for pericardial window.  He underwent pericardial window , and was found to have severe hemorraghic  pericarditis. Fluid sent for analysis including cytology, microbiology and culture.  - started pt on colchicine and pain medications. Will continue to monitor.  - discussed with Dr Servando Snare.   3. Leukocytosis:  - probably reactive, he denies fever and no evidence of infection .  4. Atrial fibrillation with RVR: pt went in to afib with RVR the evening on 4/16. He was started on cardizem and transferred to step down.  - his rate is better controlled, converted to sinus , will discontinue the cardizem gtt and is on metoprolol as per cardiolgoy recommendations.   5. Elevated liver function tests: - denies any abdominal pain.  Unclear etiology. Continue to monitor.   - US abdomen negative . Liver function panel shows improvement.   6. Obstructive sleep apnea: - on CPAP at home.   7. Gout: - stable.   8. Hypertension: controlled.   DVT prophylaxis.   Code Status: full code Family Communication: none at bedside Disposition Plan: pending.    Consultants:  Cardiology Dr Debara Pickett.  CT surgery   Procedures:  Echocardiogram  Pericardial window  Antibiotics:  none  HPI/Subjective: Is alert awake and comfortable. Requesting for his multi vitamin with iron.   Objective: Filed Vitals:   07/19/13 0757  BP: 95/51  Pulse: 50  Temp: 95.9 F (35.5 C)  Resp: 17    Intake/Output Summary (Last 24 hours) at 07/19/13 0848 Last data filed at 07/19/13 0700  Gross per 24 hour  Intake   2476 ml  Output   1249 ml  Net   1227 ml   [employer]  07/16/13 1917 07/16/13 2245 07/17/13 2031  Weight: 142.883 kg (315 lb) 137.803 kg (303 lb 12.8 oz) 138.2 kg (304 lb 10.8 oz)    Exam:   General:  Alert afebrile comfortable  Cardiovascular: s1s2  Respiratory: ctab  Abdomen: soft NT ND BS+  Musculoskeletal: trace pedal edema  Data Reviewed: Basic Metabolic Panel:  Recent Labs Lab 07/13/13 1835 07/16/13 1919 07/18/13 0405 07/19/13 0420  NA 137 140 141 139  K 3.7 3.8  3.2* 4.8  CL 100 100 103 104  CO2 22 21 23 23   GLUCOSE 109* 105* 112* 175*  BUN 34* 38* 38* 36*  CREATININE 1.09 1.35 1.20 1.08  CALCIUM 9.0 8.6 8.7 8.3*   Liver Function Tests:  Recent Labs Lab 07/13/13 1835 07/18/13 0727 07/18/13 1800 07/19/13 0420  AST 95* 146* 139* 106*  ALT 141* 152* 157* 139*  ALKPHOS 314* 508* 461* 413*  BILITOT 1.2 1.0 1.0 0.9  PROT 6.9 6.7 6.5 6.3  ALBUMIN 2.8* 2.6* 2.5* 2.4*   No results found for this basename: LIPASE, AMYLASE,  in the last 168 hours No results found for this basename: AMMONIA,  in the last 168 hours CBC:  Recent Labs Lab 07/13/13 1730 07/16/13 1919 07/18/13 0405 07/19/13 0420  WBC 13.2* 13.8* 13.0* 13.8*  NEUTROABS 10.7*  --   --   --   HGB 12.6* 12.2* 11.4* 10.7*  HCT 36.9* 36.6* 34.7* 32.4*  MCV 90.7 90.8 91.3 91.5  PLT 383 340 319 323   Cardiac Enzymes:  Recent Labs Lab 07/15/13 0854 07/17/13 2000 07/18/13 0038 07/18/13 0727  TROPONINI 0.01 <0.30 <0.30 <0.30   BNP (last 3 results)  Recent Labs  07/13/13 1835  PROBNP 519.5*   CBG: No results found for this basename: GLUCAP,  in the last 168 hours  Recent Results (from the past 240 hour(s))  MRSA PCR SCREENING     Status: None   Collection Time    07/18/13  4:03 AM      Result Value Ref Range Status   MRSA by PCR NEGATIVE  NEGATIVE Final   Comment:            The GeneXpert MRSA Assay (FDA     approved for NASAL specimens     only), is one component of a     comprehensive MRSA colonization     surveillance program. It is not     intended to diagnose MRSA     infection nor to guide or     monitor treatment for     MRSA infections.  BODY FLUID CULTURE     Status: None   Collection Time    07/18/13 12:43 PM      Result Value Ref Range Status   Specimen Description FLUID PERICARDIAL   Final   Special Requests PT ON ZINACEF 1.5    Final   Gram Stain     Final   Value: RARE WBC PRESENT,BOTH PMN AND MONONUCLEAR     NO ORGANISMS SEEN     TECH  REVIEW     Performed at [facility]   Culture PENDING   Incomplete   Report Status PENDING   Incomplete  GRAM STAIN     Status: None   Collection Time    07/18/13 12:43 PM      Result Value Ref Range Status   Specimen Description FLUID PERICARDIAL   Final   Special Requests PT ON ZINACEF 1.5   Final   Gram Stain  Final   Value: RARE WBC PRESENT,BOTH PMN AND MONONUCLEAR     NO ORGANISMS SEEN   Report Status 07/18/2013 FINAL   Final  BODY FLUID CULTURE     Status: None   Collection Time    07/18/13 12:43 PM      Result Value Ref Range Status   Specimen Description FLUID PERICARDIAL   Final   Special Requests FLUID ON SWAB PATIEN ON FOLLOWING ZINACEF 1.5   Final   Gram Stain     Final   Value: RARE WBC PRESENT,BOTH PMN AND MONONUCLEAR     NO ORGANISMS SEEN     Performed at [facility]   Culture PENDING   Incomplete   Report Status PENDING   Incomplete  ANAEROBIC CULTURE     Status: None   Collection Time    07/18/13 12:43 PM      Result Value Ref Range Status   Specimen Description FLUID PERICARDIAL   Final   Special Requests FLUID ON SWAB   Final   Gram Stain     Final   Value: RARE WBC PRESENT,BOTH PMN AND MONONUCLEAR     NO ORGANISMS SEEN     Performed at [facility]   Culture PENDING   Incomplete   Report Status PENDING   Incomplete     Studies: Dg Chest Port 1 View  07/18/2013   CLINICAL DATA:  Cough.  Status post pericardial window procedure.  EXAM: PORTABLE CHEST - 1 VIEW  COMPARISON:  CT ANGIO CHEST W/CM &/OR WO/CM dated 07/16/2013; DG CHEST 2 VIEW dated 07/15/2013  FINDINGS: Right IJ line tip: Upper SVC. Moderately enlarged cardiopericardial silhouette. Indistinct pulmonary vasculature compatible with pulmonary venous hypertension.  Right costophrenic angle excluded. No pneumothorax or significant pneumomediastinum observed. Retrocardiac density suggests mild atelectasis in the left lower lobe.  IMPRESSION: 1. Continued enlargement of the  cardiopericardial silhouette, with underlying pulmonary venous hypertension. 2. Mild atelectasis in the left lower lobe. 3. Right IJ line tip:  Upper SVC.   Electronically Signed   By: Sherryl Barters M.D.   On: 07/18/2013 15:01    Scheduled Meds: . calcium carbonate  250 mg Oral TID WC  . colchicine  0.6 mg Oral BID  . finasteride  5 mg Oral Daily  . furosemide  40 mg Intravenous Daily  . gabapentin  100 mg Oral BID  . LORazepam  0.5 mg Oral Daily  . LORazepam  1 mg Oral QHS  . risperiDONE  0.5 mg Oral QHS  . sertraline  150 mg Oral Q breakfast  . vitamin B-12  1,000 mcg Oral Daily   Continuous Infusions: . dextrose 5 % and 0.45% NaCl 50 mL/hr at 07/18/13 1630  . diltiazem (CARDIZEM) infusion 5 mg/hr (07/18/13 2000)  . heparin 1,900 Units/hr (07/19/13 0524)    Principal Problem:   CHF (congestive heart failure) Active Problems:   HYPERLIPIDEMIA   HTN (hypertension)   SLEEP APNEA   Morbid obesity   Morbid obesity with BMI of 45.0-49.9, adult   H/O laparoscopic adjustable gastric banding & hiatal hernia repair 04/08/13   Pericardial effusion   Pericarditis   Post viral syndrome   Pulmonary embolism   Hepatitis   Atrial fibrillation   Pulmonary embolus    Time spent: 25 min    Hosie Poisson  [facility] Pager [phone]  If 7PM-7AM, please contact night-coverage at [email or web address], password [facility] 07/19/2013, 8:48 AM  LOS: 3 days

## 2013-07-20 ENCOUNTER — Inpatient Hospital Stay (HOSPITAL_COMMUNITY): Payer: Medicare Other

## 2013-07-20 LAB — CBC
HCT: 32.3 % — ABNORMAL LOW (ref 39.0–52.0)
HEMOGLOBIN: 10.4 g/dL — AB (ref 13.0–17.0)
MCH: 29.6 pg (ref 26.0–34.0)
MCHC: 32.2 g/dL (ref 30.0–36.0)
MCV: 92 fL (ref 78.0–100.0)
Platelets: 304 10*3/uL (ref 150–400)
RBC: 3.51 MIL/uL — ABNORMAL LOW (ref 4.22–5.81)
RDW: 14.7 % (ref 11.5–15.5)
WBC: 14 10*3/uL — ABNORMAL HIGH (ref 4.0–10.5)

## 2013-07-20 LAB — COMPREHENSIVE METABOLIC PANEL
ALK PHOS: 349 U/L — AB (ref 39–117)
ALT: 118 U/L — AB (ref 0–53)
AST: 77 U/L — AB (ref 0–37)
Albumin: 2.3 g/dL — ABNORMAL LOW (ref 3.5–5.2)
BUN: 42 mg/dL — ABNORMAL HIGH (ref 6–23)
CALCIUM: 8.2 mg/dL — AB (ref 8.4–10.5)
CO2: 23 meq/L (ref 19–32)
Chloride: 105 mEq/L (ref 96–112)
Creatinine, Ser: 1.03 mg/dL (ref 0.50–1.35)
GFR calc Af Amer: 85 mL/min — ABNORMAL LOW (ref >90)
GFR, EST NON AFRICAN AMERICAN: 73 mL/min — AB (ref >90)
GLUCOSE: 103 mg/dL — AB (ref 70–99)
POTASSIUM: 4.1 meq/L (ref 3.7–5.3)
SODIUM: 139 meq/L (ref 137–147)
Total Bilirubin: 0.6 mg/dL (ref 0.3–1.2)
Total Protein: 5.9 g/dL — ABNORMAL LOW (ref 6.0–8.3)

## 2013-07-20 MED ORDER — BISACODYL 10 MG RE SUPP
10.0000 mg | Freq: Every day | RECTAL | Status: DC | PRN
  Administered 2013-07-20: 10 mg via RECTAL
  Filled 2013-07-20: qty 1

## 2013-07-20 MED ORDER — KETOROLAC TROMETHAMINE 15 MG/ML IJ SOLN
15.0000 mg | Freq: Four times a day (QID) | INTRAMUSCULAR | Status: DC | PRN
  Administered 2013-07-20: 15 mg via INTRAVENOUS
  Filled 2013-07-20: qty 1

## 2013-07-20 MED ORDER — FUROSEMIDE 20 MG PO TABS
20.0000 mg | ORAL_TABLET | Freq: Every day | ORAL | Status: DC
  Administered 2013-07-21 – 2013-07-25 (×5): 20 mg via ORAL
  Filled 2013-07-20 (×5): qty 1

## 2013-07-20 NOTE — Progress Notes (Signed)
[facility] PROGRESS NOTE  JMICHAEL [name] GEX:[id number] DOB: 06-25-1945 DOA: 07/16/2013 PCP: [name], MD Interim summary: [name] is an 68 y.o. male with hx of bariatric surgery one year ago, lost about 77 lbs total before and after surgery, hx of HTN, obesity and sleep apnea, gout, depression, BPH, noted that he had a severe "viral illness" about 3 months ago, followed by a persistent but no productive cough, wheezing and shortness of breath. He was seen at the urgent care, Dx with URI, and given symptomatic Tx. He saw his PCP, who was concerned about a PE, and thus started him on Xarelto and Lasix, and he presented to the ER today with same symptoms. He said his chest pain was with movement, better with lying down and with palpation. He has no orthopnea or PND. He denied fever, chills, nausea, vomiting or diaphoresis. Evaluation in the ER showed EKG with no ST elevation, in NSR, with S1Q3 and reverse T wave in the precordial leads. A CTPA was done, and it showed a small clot burden acute pulmonary edema, with pericardial effusion along with bilateral pleural effusion. He denied rash, joint pain, family hx of rhematoid arthritis, or lupus. [facility] was asked to admit him for PE and pericarditis. Echocardiogram was done, showed moderate to large pericardial effusion, then cardiology was consulted and he was put on IV lasix. Meanwhile the night of 4/16 he went in to afib with RVR, probably secondary to PE and was transferred to step down and started on cardizem drip. He converted to sinus the same night , cardizem discontinued and he was resumed on metoprolol. CT surgery was consulted for pericardial window. He underwent pericardial window on 4/17 and the hemorrhagic fluid was sent for analysis. He was also started on colchicine for his pericarditis.  So far his gram stain and cultures have been negative. Drainage from the chest tube minimal at this time. Plan to d/c his chest tube  in the next 24 hours.   Assessment/Plan: 1. Small subsegmental PE: - Started pt on xarelto by PCP on 4/16, but was later changed to IV heparin for convenience for surgery by cardiology.  - lower extremity dopplers and negative for DVT.  - he is hemodynamically stable and  2 D echocardiogram revealed moderate to large pericardial effusion, with dilated left atrium. LVEF is preserved.  - he is currently on xarelto and his H&h remained stable.   2. Moderate pericardial effusion/ extensive pericarditis.:  - unclear etiology, idiopathic.  - echo done showed moderate pericardial effusion. No evidence of hemodynamic compromise.  - CT surgery consulted for pericardial window.  He underwent pericardial window , and was found to have severe hemorraghic pericarditis. Fluid sent for analysis including cytology, microbiology and culture.  - started pt on colchicine and pain medications.   3. Leukocytosis:  - probably reactive, he denies fever .   4. Atrial fibrillation with RVR: pt went in to afib with RVR the evening on 4/16. He was started on cardizem and transferred to step down.  - his rate is better controlled, converted to sinus , on metoprolol.   5. Elevated liver function tests: - denies any abdominal pain.  Unclear etiology. Continue to monitor.   - US abdomen negative . Liver function panel shows improvement.   6. Obstructive sleep apnea: - on CPAP at home.   7. Gout: - stable.   8. Hypertension: controlled.   DVT prophylaxis.   Code Status: full code Family Communication: none at  bedside Disposition Plan: pending.    Consultants:  Cardiology Dr Debara Pickett.  CT surgery   Procedures:  Echocardiogram  Pericardial window on 4/17  Antibiotics:  none  HPI/Subjective: Is alert awake and comfortable. Cough has improved.   Objective: Filed Vitals:   07/20/13 1220  BP: 106/68  Pulse: 66  Temp: 98.8 F (37.1 C)  Resp: 16    Intake/Output Summary (Last 24 hours) at  07/20/13 1433 Last data filed at 07/20/13 1300  Gross per 24 hour  Intake    120 ml  Output   4455 ml  Net  -4335 ml   Filed Weights   07/16/13 2245 07/17/13 2031 07/20/13 0425  Weight: 137.803 kg (303 lb 12.8 oz) 138.2 kg (304 lb 10.8 oz) 139.6 kg (307 lb 12.2 oz)    Exam:   General:  Alert afebrile comfortable  Cardiovascular: s1s2  Respiratory: ctab  Abdomen: soft NT ND BS+  Musculoskeletal: trace pedal edema  Data Reviewed: Basic Metabolic Panel:  Recent Labs Lab 07/13/13 1835 07/16/13 1919 07/18/13 0405 07/19/13 0420 07/20/13 0420  NA 137 140 141 139 139  K 3.7 3.8 3.2* 4.8 4.1  CL 100 100 103 104 105  CO2 22 21 23 23 23   GLUCOSE 109* 105* 112* 175* 103*  BUN 34* 38* 38* 36* 42*  CREATININE 1.09 1.35 1.20 1.08 1.03  CALCIUM 9.0 8.6 8.7 8.3* 8.2*   Liver Function Tests:  Recent Labs Lab 07/13/13 1835 07/18/13 0727 07/18/13 1800 07/19/13 0420 07/20/13 0420  AST 95* 146* 139* 106* 77*  ALT 141* 152* 157* 139* 118*  ALKPHOS 314* 508* 461* 413* 349*  BILITOT 1.2 1.0 1.0 0.9 0.6  PROT 6.9 6.7 6.5 6.3 5.9*  ALBUMIN 2.8* 2.6* 2.5* 2.4* 2.3*   No results found for this basename: LIPASE, AMYLASE,  in the last 168 hours No results found for this basename: AMMONIA,  in the last 168 hours CBC:  Recent Labs Lab 07/13/13 1730 07/16/13 1919 07/18/13 0405 07/19/13 0420 07/20/13 0420  WBC 13.2* 13.8* 13.0* 13.8* 14.0*  NEUTROABS 10.7*  --   --   --   --   HGB 12.6* 12.2* 11.4* 10.7* 10.4*  HCT 36.9* 36.6* 34.7* 32.4* 32.3*  MCV 90.7 90.8 91.3 91.5 92.0  PLT 383 340 319 323 304   Cardiac Enzymes:  Recent Labs Lab 07/15/13 0854 07/17/13 2000 07/18/13 0038 07/18/13 0727  TROPONINI 0.01 <0.30 <0.30 <0.30   BNP (last 3 results)  Recent Labs  07/13/13 1835  PROBNP 519.5*   CBG: No results found for this basename: GLUCAP,  in the last 168 hours  Recent Results (from the past 240 hour(s))  MRSA PCR SCREENING     Status: None   Collection  Time    07/18/13  4:03 AM      Result Value Ref Range Status   MRSA by PCR NEGATIVE  NEGATIVE Final   Comment:            The GeneXpert MRSA Assay (FDA     approved for NASAL specimens     only), is one component of a     comprehensive MRSA colonization     surveillance program. It is not     intended to diagnose MRSA     infection nor to guide or     monitor treatment for     MRSA infections.  BODY FLUID CULTURE     Status: None   Collection Time    07/18/13 12:43 PM  Result Value Ref Range Status   Specimen Description FLUID PERICARDIAL   Final   Special Requests PT ON ZINACEF 1.5    Final   Gram Stain     Final   Value: RARE WBC PRESENT,BOTH PMN AND MONONUCLEAR     NO ORGANISMS SEEN     TECH REVIEW     Performed at [facility]   Culture     Final   Value: NO GROWTH 2 DAYS     Performed at [facility]   Report Status PENDING   Incomplete  FUNGUS CULTURE W SMEAR     Status: None   Collection Time    07/18/13 12:43 PM      Result Value Ref Range Status   Specimen Description FLUID PERICARDIAL   Final   Special Requests PT ON ZINACEF 1.5   Final   Fungal Smear     Final   Value: NO YEAST OR FUNGAL ELEMENTS SEEN     Performed at [facility]   Culture     Final   Value: CULTURE IN PROGRESS FOR FOUR WEEKS     Performed at [facility]   Report Status PENDING   Incomplete  ANAEROBIC CULTURE     Status: None   Collection Time    07/18/13 12:43 PM      Result Value Ref Range Status   Specimen Description FLUID PERICARDIAL   Final   Special Requests PT ON ZINACEF 1.5   Final   Gram Stain PENDING   Incomplete   Culture     Final   Value: NO ANAEROBES ISOLATED; CULTURE IN PROGRESS FOR 5 DAYS     Performed at [facility]   Report Status PENDING   Incomplete  GRAM STAIN     Status: None   Collection Time    07/18/13 12:43 PM      Result Value Ref Range Status   Specimen Description FLUID PERICARDIAL   Final   Special  Requests PT ON ZINACEF 1.5   Final   Gram Stain     Final   Value: RARE WBC PRESENT,BOTH PMN AND MONONUCLEAR     NO ORGANISMS SEEN   Report Status 07/18/2013 FINAL   Final  BODY FLUID CULTURE     Status: None   Collection Time    07/18/13 12:43 PM      Result Value Ref Range Status   Specimen Description FLUID PERICARDIAL   Final   Special Requests FLUID ON SWAB PATIEN ON FOLLOWING ZINACEF 1.5   Final   Gram Stain     Final   Value: RARE WBC PRESENT,BOTH PMN AND MONONUCLEAR     NO ORGANISMS SEEN     Performed at [facility]   Culture     Final   Value: NO GROWTH 2 DAYS     Performed at [facility]   Report Status PENDING   Incomplete  FUNGUS CULTURE W SMEAR     Status: None   Collection Time    07/18/13 12:43 PM      Result Value Ref Range Status   Specimen Description FLUID PERICARDIAL   Final   Special Requests PT ON ZINACEF 1.5 FLUID ON SWAB   Final   Fungal Smear     Final   Value: NO YEAST OR FUNGAL ELEMENTS SEEN     Performed at [facility]   Culture     Final   Value:  CULTURE IN PROGRESS FOR FOUR WEEKS     Performed at [facility]   Report Status PENDING   Incomplete  AFB CULTURE WITH SMEAR     Status: None   Collection Time    07/18/13 12:43 PM      Result Value Ref Range Status   Specimen Description FLUID PERICARDIAL   Final   Special Requests PT ON ZINACEF 1.5 FLUID ON SWAB   Final   ACID FAST SMEAR     Final   Value: NO ACID FAST BACILLI SEEN     Performed at [facility]   Culture     Final   Value: CULTURE WILL BE EXAMINED FOR 6 WEEKS BEFORE ISSUING A FINAL REPORT     Performed at [facility]   Report Status PENDING   Incomplete  ANAEROBIC CULTURE     Status: None   Collection Time    07/18/13 12:43 PM      Result Value Ref Range Status   Specimen Description FLUID PERICARDIAL   Final   Special Requests FLUID ON SWAB   Final   Gram Stain     Final   Value: RARE WBC PRESENT,BOTH PMN AND MONONUCLEAR      NO ORGANISMS SEEN     Performed at [facility]   Culture     Final   Value: NO ANAEROBES ISOLATED; CULTURE IN PROGRESS FOR 5 DAYS     Performed at [facility]   Report Status PENDING   Incomplete     Studies: Dg Chest Port 1 View  07/18/2013   CLINICAL DATA:  Cough.  Status post pericardial window procedure.  EXAM: PORTABLE CHEST - 1 VIEW  COMPARISON:  CT ANGIO CHEST W/CM &/OR WO/CM dated 07/16/2013; DG CHEST 2 VIEW dated 07/15/2013  FINDINGS: Right IJ line tip: Upper SVC. Moderately enlarged cardiopericardial silhouette. Indistinct pulmonary vasculature compatible with pulmonary venous hypertension.  Right costophrenic angle excluded. No pneumothorax or significant pneumomediastinum observed. Retrocardiac density suggests mild atelectasis in the left lower lobe.  IMPRESSION: 1. Continued enlargement of the cardiopericardial silhouette, with underlying pulmonary venous hypertension. 2. Mild atelectasis in the left lower lobe. 3. Right IJ line tip:  Upper SVC.   Electronically Signed   By: Sherryl Barters M.D.   On: 07/18/2013 15:01   Dg Chest Port 1v Same Day  07/20/2013   CLINICAL DATA:  67 year old male status post pericardial window for effusion. Initial encounter.  EXAM: PORTABLE CHEST - 1 VIEW SAME DAY  COMPARISON:  07/18/2013 and earlier.  FINDINGS: Portable AP upright view at 0722 hr. Stable right IJ central line. Stable cardiac size and mediastinal contours. No pneumothorax or edema. Continued retrocardiac opacity but low the less veiling opacity at the lung bases.  IMPRESSION: 1. Stable cardiac silhouette since 07/18/2013. 2. Suspect regressed small pleural effusions but continued lower lobe collapse or consolidation.   Electronically Signed   By: Lars Pinks M.D.   On: 07/20/2013 13:28    Scheduled Meds: . calcium carbonate  250 mg Oral TID WC  . colchicine  0.6 mg Oral BID  . finasteride  5 mg Oral Daily  . [START ON 07/21/2013] furosemide  20 mg Oral Daily  .  gabapentin  100 mg Oral BID  . LORazepam  0.5 mg Oral Daily  . LORazepam  1 mg Oral QHS  . metoprolol succinate  12.5 mg Oral Daily  . multivitamins with iron  1 tablet Oral Daily  .  risperiDONE  0.5 mg Oral QHS  . Rivaroxaban  15 mg Oral BID WC  . sertraline  150 mg Oral Q breakfast  . vitamin B-12  1,000 mcg Oral Daily   Continuous Infusions: . dextrose 5 % and 0.45% NaCl 50 mL/hr at 07/19/13 0800    Principal Problem:   CHF (congestive heart failure) Active Problems:   HYPERLIPIDEMIA   HTN (hypertension)   SLEEP APNEA   Morbid obesity   Morbid obesity with BMI of 45.0-49.9, adult   H/O laparoscopic adjustable gastric banding & hiatal hernia repair 04/08/13   Pericardial effusion   Pericarditis   Post viral syndrome   Pulmonary embolism   Hepatitis   Atrial fibrillation   Pulmonary embolus    Time spent: 25 min    Hosie Poisson  [facility] Pager [phone]  If 7PM-7AM, please contact night-coverage at [email or web address], password [facility] 07/20/2013, 2:33 PM  LOS: 4 days

## 2013-07-20 NOTE — Progress Notes (Addendum)
[facility] DAILY ICU PROGRESS NOTE                   [address] [place]            ,[place] [address]          [phone]   2 Days Post-Op Procedure(s) (LRB): SUBXYPHOID PERICARDIAL WINDOW (N/A) INTRAOPERATIVE TRANSESOPHAGEAL ECHOCARDIOGRAM (N/A)  Total Length of Stay:  LOS: 4 days   Subjective:  feels ok  Objective: Vital signs in last 24 hours: Temp:  [98.1 F (36.7 C)-98.7 F (37.1 C)] 98.6 F (37 C) (04/19 0749) Pulse Rate:  [50-63] 62 (04/19 0930) Cardiac Rhythm:  [-] Sinus bradycardia (04/18 2000) Resp:  [13-29] 29 (04/19 0800) BP: (89-119)/(41-59) 119/54 mmHg (04/19 0930) SpO2:  [95 %-98 %] 97 % (04/19 0800) Arterial Line BP: (99-145)/(42-72) 114/52 mmHg (04/19 0800) Weight:  [307 lb 12.2 oz (139.6 kg)] 307 lb 12.2 oz (139.6 kg) (04/19 0425)  Filed Weights   07/16/13 2245 07/17/13 2031 07/20/13 0425  Weight: 303 lb 12.8 oz (137.803 kg) 304 lb 10.8 oz (138.2 kg) 307 lb 12.2 oz (139.6 kg)    Weight change:    Hemodynamic parameters for last 24 hours: CVP:  [4 mmHg-8 mmHg] 4 mmHg  Intake/Output from previous day: 04/18 0701 - 04/19 0700 In: 510.5 [P.O.:360; I.V.:150.5] Out: 3005 [Urine:3005]  Intake/Output this shift: Total I/O In: -  Out: 2000 [Urine:2000]  Current Meds: Scheduled Meds: . calcium carbonate  250 mg Oral TID WC  . colchicine  0.6 mg Oral BID  . finasteride  5 mg Oral Daily  . [START ON 07/21/2013] furosemide  20 mg Oral Daily  . gabapentin  100 mg Oral BID  . LORazepam  0.5 mg Oral Daily  . LORazepam  1 mg Oral QHS  . metoprolol succinate  12.5 mg Oral Daily  . multivitamins with iron  1 tablet Oral Daily  . risperiDONE  0.5 mg Oral QHS  . Rivaroxaban  15 mg Oral BID WC  . sertraline  150 mg Oral Q breakfast  . vitamin B-12  1,000 mcg Oral Daily   Continuous Infusions: . dextrose 5 % and 0.45% NaCl 50 mL/hr at 07/19/13 0800   PRN Meds:.fentaNYL, HYDROmorphone (DILAUDID) injection, ketorolac, ondansetron (ZOFRAN) IV,  ondansetron  General appearance: alert, cooperative and no distress Heart: regular rate and rhythm Lungs: clear to auscultation bilaterally Abdomen: benign Extremities: + BLE edema Wound: dressings CDI  Lab Results: CBC: Recent Labs  07/19/13 0420 07/20/13 0420  WBC 13.8* 14.0*  HGB 10.7* 10.4*  HCT 32.4* 32.3*  PLT 323 304   BMET:  Recent Labs  07/19/13 0420 07/20/13 0420  NA 139 139  K 4.8 4.1  CL 104 105  CO2 23 23  GLUCOSE 175* 103*  BUN 36* 42*  CREATININE 1.08 1.03  CALCIUM 8.3* 8.2*    PT/INR: No results found for this basename: LABPROT, INR,  in the last 72 hours Radiology: Dg Chest Port 1 View  07/18/2013   CLINICAL DATA:  Cough.  Status post pericardial window procedure.  EXAM: PORTABLE CHEST - 1 VIEW  COMPARISON:  CT ANGIO CHEST W/CM &/OR WO/CM dated 07/16/2013; DG CHEST 2 VIEW dated 07/15/2013  FINDINGS: Right IJ line tip: Upper SVC. Moderately enlarged cardiopericardial silhouette. Indistinct pulmonary vasculature compatible with pulmonary venous hypertension.  Right costophrenic angle excluded. No pneumothorax or significant pneumomediastinum observed. Retrocardiac density suggests mild atelectasis in the left lower lobe.  IMPRESSION: 1. Continued enlargement of the  cardiopericardial silhouette, with underlying pulmonary venous hypertension. 2. Mild atelectasis in the left lower lobe. 3. Right IJ line tip:  Upper SVC.   Electronically Signed   By: Sherryl Barters M.D.   On: 07/18/2013 15:01     Assessment/Plan: S/P Procedure(s) (LRB): SUBXYPHOID PERICARDIAL WINDOW (N/A) INTRAOPERATIVE TRANSESOPHAGEAL ECHOCARDIOGRAM (N/A)  1 looks like about 30 cc serosang drainage since yesterday, prob d/c CT in am 2 labs stable 3 In NSR/Sinus brady 4 D/C aline 5 cultures neg so far, path pending    [name] 07/20/2013 11:10 AM  I have seen and examined the patient and agree with the assessment and plan as outlined.  Primary complaint is  constipation.  Rexene Alberts 07/20/2013 2:07 PM

## 2013-07-20 NOTE — Progress Notes (Signed)
Patient ID: [name], male   DOB: November 09, 1945, 68 y.o.   MRN: [id number]    SUBJECTIVE: Patient remains in NSR.  Chest tube still in place without much drainage.  No complaints.  CVP 4 this morning.   Scheduled Meds: . calcium carbonate  250 mg Oral TID WC  . colchicine  0.6 mg Oral BID  . finasteride  5 mg Oral Daily  . [START ON 07/21/2013] furosemide  20 mg Oral Daily  . gabapentin  100 mg Oral BID  . LORazepam  0.5 mg Oral Daily  . LORazepam  1 mg Oral QHS  . metoprolol succinate  12.5 mg Oral Daily  . multivitamins with iron  1 tablet Oral Daily  . risperiDONE  0.5 mg Oral QHS  . Rivaroxaban  15 mg Oral BID WC  . sertraline  150 mg Oral Q breakfast  . vitamin B-12  1,000 mcg Oral Daily   Continuous Infusions: . dextrose 5 % and 0.45% NaCl 50 mL/hr at 07/19/13 0800   PRN Meds:.fentaNYL, HYDROmorphone (DILAUDID) injection, ketorolac, ondansetron (ZOFRAN) IV, ondansetron    Filed Vitals:   07/20/13 0545 07/20/13 0749 07/20/13 0800 07/20/13 0930  BP:  98/54  119/54  Pulse: 63  57 62  Temp:  98.6 F (37 C)    TempSrc:  Oral    Resp: 22  29   Height:      Weight:      SpO2: 95%  97%     Intake/Output Summary (Last 24 hours) at 07/20/13 1109 Last data filed at 07/20/13 1030  Gross per 24 hour  Intake    120 ml  Output   4655 ml  Net  -4535 ml    LABS: Basic Metabolic Panel:  Recent Labs  07/19/13 0420 07/20/13 0420  NA 139 139  K 4.8 4.1  CL 104 105  CO2 23 23  GLUCOSE 175* 103*  BUN 36* 42*  CREATININE 1.08 1.03  CALCIUM 8.3* 8.2*   Liver Function Tests:  Recent Labs  07/19/13 0420 07/20/13 0420  AST 106* 77*  ALT 139* 118*  ALKPHOS 413* 349*  BILITOT 0.9 0.6  PROT 6.3 5.9*  ALBUMIN 2.4* 2.3*   No results found for this basename: LIPASE, AMYLASE,  in the last 72 hours CBC:  Recent Labs  07/19/13 0420 07/20/13 0420  WBC 13.8* 14.0*  HGB 10.7* 10.4*  HCT 32.4* 32.3*  MCV 91.5 92.0  PLT 323 304   Cardiac Enzymes:  Recent  Labs  07/17/13 2000 07/18/13 0038 07/18/13 0727  TROPONINI <0.30 <0.30 <0.30   BNP: No components found with this basename: POCBNP,  D-Dimer: No results found for this basename: DDIMER,  in the last 72 hours Hemoglobin A1C: No results found for this basename: HGBA1C,  in the last 72 hours Fasting Lipid Panel: No results found for this basename: CHOL, HDL, LDLCALC, TRIG, CHOLHDL, LDLDIRECT,  in the last 72 hours Thyroid Function Tests: No results found for this basename: TSH, T4TOTAL, FREET3, T3FREE, THYROIDAB,  in the last 72 hours Anemia Panel: No results found for this basename: VITAMINB12, FOLATE, FERRITIN, TIBC, IRON, RETICCTPCT,  in the last 72 hours  RADIOLOGY: Dg Chest 2 View  07/15/2013   CLINICAL DATA:  Dyspnea and chest pain.  EXAM: CHEST  2 VIEW  COMPARISON:  07/13/2013 and 09/26/2012  FINDINGS: Lungs are adequately inflated with continued evidence of a small left pleural effusion with possible slight interval improvement. There is mild stable cardiomegaly. Remainder the exam  is unchanged.  IMPRESSION: Continued small left pleural effusion with possible slight interval improvement.   Electronically Signed   By: Marin Olp M.D.   On: 07/15/2013 11:32   Dg Chest 2 View  07/13/2013   CLINICAL DATA:  Weakness and chest pain.  EXAM: CHEST - 2 VIEW  COMPARISON:  DG CHEST 2 VIEW dated 09/26/2012; DG THORACIC SPINE dated 01/19/2011  FINDINGS: The heart is mildly enlarged. Mild interstitial and pulmonary vascular prominence identified without evidence of overt edema. There is a small left pleural effusion. Bony structures show stable mild degenerative disease of the thoracic spine.  IMPRESSION: Cardiomegaly and pulmonary interstitial prominence with small left pleural effusion. No overt edema is identified   Electronically Signed   By: Aletta Edouard M.D.   On: 07/13/2013 18:16   Ct Angio Chest Pe W/cm &/or Wo Cm  07/16/2013   CLINICAL DATA:  Shortness of breath, neck/back pain,  elevated D-dimer, evaluate for PE. Lap band surgery in January.  EXAM: CT ANGIOGRAPHY CHEST WITH CONTRAST  TECHNIQUE: Multidetector CT imaging of the chest was performed using the standard protocol during bolus administration of intravenous contrast. Multiplanar CT image reconstructions and MIPs were obtained to evaluate the vascular anatomy.  CONTRAST:  27mL OMNIPAQUE IOHEXOL 350 MG/ML SOLN  COMPARISON:  None.  FINDINGS: Tiny subsegmental filling defects within branches of the right lower lobe pulmonary artery (series 5/ images 169, 170, and 189), suspicious for pulmonary emboli. Overall clot burden is small. No findings to suggest right heart strain (RV-to-LV ratio 0.87).  Small to moderate left and small right pleural effusions. No frank interstitial edema. No pneumothorax.  Visualized right thyroid is notable for a suspected 2.2 cm thyroid nodule (series 4/ image 1), incompletely visualized.  The heart is top-normal in size. Small to moderate pericardial effusion with a mildly irregular appearance. Mild atherosclerotic calcifications of the aortic arch.  No suspicious mediastinal, hilar, or axillary lymphadenopathy.  Visualized upper abdomen is notable for a laparoscopic band in satisfactory position.  Degenerative changes of the visualized thoracolumbar spine.  Review of the MIP images confirms the above findings.  IMPRESSION: Suspected tiny subsegmental pulmonary emboli within branches of the right lower lobe pulmonary artery. Overall clot burden is small.  Small to moderate left and small right pleural effusions. No frank interstitial edema.  Small to moderate pericardial effusion. Given the irregular appearance, correlate for pericarditis.  Critical Value/emergent results were called by telephone at the time of interpretation on 07/16/2013 at 5:28 PM to Dr. Gwyndolyn Saxon HOPPER, who verbally acknowledged these results.   Electronically Signed   By: Julian Hy M.D.   On: 07/16/2013 17:29   US Abdomen  Limited  07/13/2013   CLINICAL DATA:  Increased liver function tests, right upper quadrant pain  EXAM: US ABDOMEN LIMITED - RIGHT UPPER QUADRANT  COMPARISON:  None.  FINDINGS: Gallbladder:  Surgically at  Common bile duct:  Diameter: 7 mm  Liver:  No focal abnormalities.  IMPRESSION: No acute abnormalities   Electronically Signed   By: Skipper Cliche M.D.   On: 07/13/2013 20:37   Dg Chest Port 1 View  07/18/2013   CLINICAL DATA:  Cough.  Status post pericardial window procedure.  EXAM: PORTABLE CHEST - 1 VIEW  COMPARISON:  CT ANGIO CHEST W/CM &/OR WO/CM dated 07/16/2013; DG CHEST 2 VIEW dated 07/15/2013  FINDINGS: Right IJ line tip: Upper SVC. Moderately enlarged cardiopericardial silhouette. Indistinct pulmonary vasculature compatible with pulmonary venous hypertension.  Right costophrenic angle excluded. No pneumothorax  or significant pneumomediastinum observed. Retrocardiac density suggests mild atelectasis in the left lower lobe.  IMPRESSION: 1. Continued enlargement of the cardiopericardial silhouette, with underlying pulmonary venous hypertension. 2. Mild atelectasis in the left lower lobe. 3. Right IJ line tip:  Upper SVC.   Electronically Signed   By: Sherryl Barters M.D.   On: 07/18/2013 15:01    PHYSICAL EXAM General: NAD Neck: Thick, JVP ?7-8, no thyromegaly or thyroid nodule.  Lungs: Clear to auscultation bilaterally with normal respiratory effort. CV: Nondisplaced PMI.  Heart regular S1/S2, no S3/S4, no murmur.  Trace ankle edema. Abdomen: Soft, nontender, no hepatosplenomegaly, no distention.  Neurologic: Alert and oriented x 3.  Psych: Normal affect. Extremities: No clubbing or cyanosis.   TELEMETRY: Reviewed telemetry pt in NSR  ASSESSMENT AND PLAN: 68 yo with history of HTN s/p lap band in 1/15 was admitted with atrial fibrillation/RVR and the finding of a pulmonary embolus.  CTA that found PE also showed a moderate to large pericardial effusion.  Echo showed no definite  tamponade, but patient had pericardial window yesterday for diagnostic and therapeutic purposes.   1. Atrial fibrillation: This may be secondary to PE or pericardial effusion.  Now back in sinus bradycardia.   - Started back on Xarelto last night.  - Continue low dose Toprol XL.  2. Pericardial effusion: Etiology is uncertain. Pericardial effusion initially noted on CTA when PE was detected.  Possible prior pericarditis (severe presumed viral infection several months ago), ?worsened by anticoagulation for PE.  Pericardial window yesterday for diagnosis and treatment.  Gram stain negative, pathology pending. He is on colchicine, continue x 3 months.  3. Pulmonary embolus: Again, etiology uncertain.  Had surgery but back in 1/15.  So far hypercoagulable workup negative.  He is now back on Xarelto.  4. CHF: Acute diastolic CHF.  CVP 4 today.  I will transition Lasix to po.  Larey Dresser 07/20/2013 11:09 AM

## 2013-07-21 ENCOUNTER — Encounter (HOSPITAL_COMMUNITY): Payer: Self-pay | Admitting: Cardiothoracic Surgery

## 2013-07-21 ENCOUNTER — Inpatient Hospital Stay (HOSPITAL_COMMUNITY): Payer: Medicare Other

## 2013-07-21 DIAGNOSIS — Z9884 Bariatric surgery status: Secondary | ICD-10-CM

## 2013-07-21 LAB — CBC
HCT: 32.5 % — ABNORMAL LOW (ref 39.0–52.0)
HCT: 27.7 % — ABNORMAL LOW (ref 39.0–52.0)
Hemoglobin: 10.6 g/dL — ABNORMAL LOW (ref 13.0–17.0)
Hemoglobin: 9.1 g/dL — ABNORMAL LOW (ref 13.0–17.0)
MCH: 29.9 pg (ref 26.0–34.0)
MCH: 30.1 pg (ref 26.0–34.0)
MCHC: 32.6 g/dL (ref 30.0–36.0)
MCHC: 32.9 g/dL (ref 30.0–36.0)
MCV: 91.8 fL (ref 78.0–100.0)
MCV: 91.7 fL (ref 78.0–100.0)
PLATELETS: 430 10*3/uL — AB (ref 150–400)
Platelets: 420 10*3/uL — ABNORMAL HIGH (ref 150–400)
RBC: 3.54 MIL/uL — ABNORMAL LOW (ref 4.22–5.81)
RBC: 3.02 MIL/uL — ABNORMAL LOW (ref 4.22–5.81)
RDW: 14.4 % (ref 11.5–15.5)
RDW: 14.5 % (ref 11.5–15.5)
WBC: 17.6 10*3/uL — AB (ref 4.0–10.5)
WBC: 20.1 10*3/uL — ABNORMAL HIGH (ref 4.0–10.5)

## 2013-07-21 LAB — COMPREHENSIVE METABOLIC PANEL
ALBUMIN: 2.5 g/dL — AB (ref 3.5–5.2)
ALT: 97 U/L — AB (ref 0–53)
AST: 49 U/L — ABNORMAL HIGH (ref 0–37)
Alkaline Phosphatase: 359 U/L — ABNORMAL HIGH (ref 39–117)
BUN: 67 mg/dL — ABNORMAL HIGH (ref 6–23)
CO2: 24 mEq/L (ref 19–32)
CREATININE: 1.01 mg/dL (ref 0.50–1.35)
Calcium: 8.3 mg/dL — ABNORMAL LOW (ref 8.4–10.5)
Chloride: 104 mEq/L (ref 96–112)
GFR calc Af Amer: 87 mL/min — ABNORMAL LOW (ref >90)
GFR calc non Af Amer: 75 mL/min — ABNORMAL LOW (ref >90)
Glucose, Bld: 105 mg/dL — ABNORMAL HIGH (ref 70–99)
Potassium: 3.8 mEq/L (ref 3.7–5.3)
SODIUM: 144 meq/L (ref 137–147)
TOTAL PROTEIN: 6.3 g/dL (ref 6.0–8.3)
Total Bilirubin: 0.6 mg/dL (ref 0.3–1.2)

## 2013-07-21 LAB — BODY FLUID CULTURE: Culture: NO GROWTH

## 2013-07-21 LAB — HEMOGLOBIN AND HEMATOCRIT, BLOOD
HEMATOCRIT: 25.5 % — AB (ref 39.0–52.0)
Hemoglobin: 8.3 g/dL — ABNORMAL LOW (ref 13.0–17.0)

## 2013-07-21 LAB — PATHOLOGIST SMEAR REVIEW

## 2013-07-21 LAB — PROCALCITONIN: Procalcitonin: <0.1 ng/mL

## 2013-07-21 LAB — OCCULT BLOOD X 1 CARD TO LAB, STOOL: FECAL OCCULT BLD: POSITIVE — AB

## 2013-07-21 MED ORDER — SODIUM CHLORIDE 0.9 % IJ SOLN: 10.0000 mL | INTRAMUSCULAR | Status: DC | PRN

## 2013-07-21 MED ORDER — SODIUM CHLORIDE 0.9 % IV SOLN
80.0000 mg | INTRAVENOUS | Status: DC
  Administered 2013-07-21 – 2013-07-22 (×2): 80 mg via INTRAVENOUS
  Filled 2013-07-21 (×2): qty 80

## 2013-07-21 MED ORDER — SODIUM CHLORIDE 0.9 % IJ SOLN
3.0000 mL | Freq: Two times a day (BID) | INTRAMUSCULAR | Status: DC
  Administered 2013-07-21 – 2013-07-22 (×3): 3 mL via INTRAVENOUS

## 2013-07-21 MED ORDER — METOPROLOL TARTRATE 25 MG PO TABS
25.0000 mg | ORAL_TABLET | Freq: Two times a day (BID) | ORAL | Status: DC
  Administered 2013-07-22 – 2013-07-25 (×7): 25 mg via ORAL
  Filled 2013-07-21 (×8): qty 1

## 2013-07-21 MED ORDER — DIGOXIN 0.25 MG/ML IJ SOLN
0.5000 mg | Freq: Once | INTRAMUSCULAR | Status: AC
  Administered 2013-07-21: 0.5 mg via INTRAVENOUS

## 2013-07-21 MED ORDER — DIGOXIN 0.25 MG/ML IJ SOLN
0.2500 mg | Freq: Four times a day (QID) | INTRAMUSCULAR | Status: AC
  Administered 2013-07-22 (×2): 0.25 mg via INTRAVENOUS
  Filled 2013-07-21 (×4): qty 1

## 2013-07-21 MED ORDER — SODIUM CHLORIDE 0.9 % IV SOLN
INTRAVENOUS | Status: DC
Start: 2013-07-21 — End: 2013-07-25
  Administered 2013-07-21: 250 mL via INTRAVENOUS

## 2013-07-21 MED ORDER — SODIUM CHLORIDE 0.9 % IJ SOLN
10.0000 mL | Freq: Two times a day (BID) | INTRAMUSCULAR | Status: DC
  Administered 2013-07-21 – 2013-07-22 (×2): 10 mL via INTRAVENOUS

## 2013-07-21 MED ORDER — DILTIAZEM HCL 100 MG IV SOLR
5.0000 mg/h | INTRAVENOUS | Status: DC
  Administered 2013-07-21 – 2013-07-22 (×2): 5 mg/h via INTRAVENOUS
  Filled 2013-07-21: qty 100

## 2013-07-21 MED ORDER — SODIUM CHLORIDE 0.9 % IJ SOLN: 3.0000 mL | INTRAMUSCULAR | Status: DC | PRN

## 2013-07-21 MED ORDER — SODIUM CHLORIDE 0.9 % IJ SOLN
10.0000 mL | INTRAMUSCULAR | Status: DC | PRN
  Administered 2013-07-21: 10 mL via INTRAVENOUS

## 2013-07-21 MED ORDER — SODIUM CHLORIDE 0.9 % IJ SOLN
10.0000 mL | Freq: Two times a day (BID) | INTRAMUSCULAR | Status: DC
  Administered 2013-07-21: 10 mL via INTRAVENOUS

## 2013-07-21 NOTE — Progress Notes (Addendum)
[facility] PROGRESS NOTE  [name] KPT:[id number] DOB: 1945/06/06 DOA: 07/16/2013 PCP: [name], MD Interim summary: [name] is an 68 y.o. male with hx of bariatric surgery one year ago, lost about 23 lbs total before and after surgery, hx of HTN, obesity and sleep apnea, gout, depression, BPH, noted that he had a severe "viral illness" about 3 months ago, followed by a persistent but no productive cough, wheezing and shortness of breath. He was seen at the urgent care, Dx with URI, and given symptomatic Tx. He saw his PCP, who was concerned about a PE, and thus started him on Xarelto and Lasix, and he presented to the ER today with same symptoms. He said his chest pain was with movement, better with lying down and with palpation. He has no orthopnea or PND. He denied fever, chills, nausea, vomiting or diaphoresis. Evaluation in the ER showed EKG with no ST elevation, in NSR, with S1Q3 and reverse T wave in the precordial leads. A CTPA was done, and it showed a small clot burden acute pulmonary edema, with pericardial effusion along with bilateral pleural effusion. He denied rash, joint pain, family hx of rhematoid arthritis, or lupus. [facility] to admit him for PE and pericarditis. Echocardiogram was done, showed moderate to large pericardial effusion, then cardiology was consulted and he was put on IV lasix. Meanwhile the night of 4/16 he went in to afib with RVR, probably secondary to PE and was transferred to step down and started on cardizem drip. He converted to sinus the same night , cardizem discontinued and he was resumed on metoprolol. CT surgery was consulted for pericardial window. He underwent pericardial window on 4/17 and the hemorrhagic fluid was sent for analysis. He was also started on colchicine for his pericarditis.  So far his gram stain and cultures have been negative. Drainage from the chest tube minimal at this time and his chest tube was  discontinued on 4/20. The evening of 4/20 pt went back in to afib with RVR and had bloody bowel movement. His xarelto was discontinued and he was put on cardizem gtt.   Assessment/Plan: 1. Small subsegmental PE: - patient was initially started on xarelto, but was stopped 4/20 after an episode of bloody BM. His last xarelto dose was on 4/20.  - lower extremity dopplers and negative for DVT. Unclear etiology. His hypercoagulable work up negative so far.   2. Moderate pericardial effusion/ extensive pericarditis.:  - unclear etiology, idiopathic.  - echo done showed moderate pericardial effusion. No evidence of hemodynamic compromise.  - CT surgery consulted for pericardial window.  He underwent pericardial window , and was found to have severe hemorraghic pericarditis. Fluid sent for analysis including cytology, microbiology and culture. So far infectious etiology is negative and path is negative for malignancy, showed reactive mesothelial cells.   - started pt on colchicine and pain medications. His toradol is stopped after his episode of bloody BM.  - CT surgery is on board.   3. Leukocytosis:  - he remains afebrile and his max temp is 99.1. His leukocytosis worsened over the last 48 hours. Pro calcitonin levels ordered.  - unclear etiology.   4.  Recurrent Atrial fibrillation with RVR:  - he initially went into Afib with RVR on the evening of 4/16, he was started on cardizem gtt, later transitioned to home metoprolol when he converted to sinus.  - he is back in afib with RVR on 4/20, and cardizem restarted by  cardiology.   5. Elevated liver function tests: - denies any abdominal pain.  Unclear etiology. Continue to monitor.   - US abdomen negative . Liver function panel shows improvement.   6. Obstructive sleep apnea: - on CPAP at home.   7. Gout: - stable.   8. Hypertension: controlled.  9. Bloody and black stools. : - he initially complained of constipation and was given  dulcolax suppository the night of 4/19. Since then pt reported 8 loose BM'S/  - on 4/20 pt reported one blood bowel movement , his BUN elevated 67 with baseline creatinine.  - xarelto stopped till further evaluation.  - made him NPO, started him on IV PPI.  - Dr Henrene Pastor consulted for further recommendations.  - If he bleeds overnight, he will need a NM bleeding scan. Please call Dr Henrene Pastor if he continues to bleed or become unstable.   DVT prophylaxis.   Code Status: full code Family Communication: wife at bedside Disposition Plan: pending.    Consultants:  Cardiology Dr Debara Pickett.  CT surgery   Gastroenterology Dr Henrene Pastor.   Procedures:  Echocardiogram  Pericardial window on 4/17  Antibiotics:  none  HPI/Subjective: Is alert awake and comfortable. Cough has improved.   Objective: Filed Vitals:   07/21/13 1650  BP: 119/69  Pulse: 86  Temp: 98.3 F (36.8 C)  Resp:     Intake/Output Summary (Last 24 hours) at 07/21/13 1834 Last data filed at 07/21/13 1300  Gross per 24 hour  Intake    720 ml  Output   1125 ml  Net   -405 ml   Filed Weights   07/16/13 2245 07/17/13 2031 07/20/13 0425  Weight: 137.803 kg (303 lb 12.8 oz) 138.2 kg (304 lb 10.8 oz) 139.6 kg (307 lb 12.2 oz)    Exam:   General:  Alert afebrile comfortable  Cardiovascular: s1s2  Respiratory: ctab  Abdomen: soft NT ND BS+  Musculoskeletal: trace pedal edema  Data Reviewed: Basic Metabolic Panel:  Recent Labs Lab 07/16/13 1919 07/18/13 0405 07/19/13 0420 07/20/13 0420 07/21/13 0457  NA 140 141 139 139 144  K 3.8 3.2* 4.8 4.1 3.8  CL 100 103 104 105 104  CO2 21 23 23 23 24   GLUCOSE 105* 112* 175* 103* 105*  BUN 38* 38* 36* 42* 67*  CREATININE 1.35 1.20 1.08 1.03 1.01  CALCIUM 8.6 8.7 8.3* 8.2* 8.3*   Liver Function Tests:  Recent Labs Lab 07/18/13 0727 07/18/13 1800 07/19/13 0420 07/20/13 0420 07/21/13 0457  AST 146* 139* 106* 77* 49*  ALT 152* 157* 139* 118* 97*  ALKPHOS  508* 461* 413* 349* 359*  BILITOT 1.0 1.0 0.9 0.6 0.6  PROT 6.7 6.5 6.3 5.9* 6.3  ALBUMIN 2.6* 2.5* 2.4* 2.3* 2.5*   No results found for this basename: LIPASE, AMYLASE,  in the last 168 hours No results found for this basename: AMMONIA,  in the last 168 hours CBC:  Recent Labs Lab 07/18/13 0405 07/19/13 0420 07/20/13 0420 07/21/13 0457 07/21/13 1800  WBC 13.0* 13.8* 14.0* 17.6* 20.1*  HGB 11.4* 10.7* 10.4* 10.6* 9.1*  HCT 34.7* 32.4* 32.3* 32.5* 27.7*  MCV 91.3 91.5 92.0 91.8 91.7  PLT 319 323 304 430* 420*   Cardiac Enzymes:  Recent Labs Lab 07/15/13 0854 07/17/13 2000 07/18/13 0038 07/18/13 0727  TROPONINI 0.01 <0.30 <0.30 <0.30   BNP (last 3 results)  Recent Labs  07/13/13 1835  PROBNP 519.5*   CBG: No results found for this basename: GLUCAP,  in  the last 168 hours  Recent Results (from the past 240 hour(s))  MRSA PCR SCREENING     Status: None   Collection Time    07/18/13  4:03 AM      Result Value Ref Range Status   MRSA by PCR NEGATIVE  NEGATIVE Final   Comment:            The GeneXpert MRSA Assay (FDA     approved for NASAL specimens     only), is one component of a     comprehensive MRSA colonization     surveillance program. It is not     intended to diagnose MRSA     infection nor to guide or     monitor treatment for     MRSA infections.  BODY FLUID CULTURE     Status: None   Collection Time    07/18/13 12:43 PM      Result Value Ref Range Status   Specimen Description FLUID PERICARDIAL   Final   Special Requests PT ON ZINACEF 1.5    Final   Gram Stain     Final   Value: RARE WBC PRESENT,BOTH PMN AND MONONUCLEAR     NO ORGANISMS SEEN     TECH REVIEW     Performed at [facility]   Culture     Final   Value: NO GROWTH 3 DAYS     Performed at [facility]   Report Status PENDING   Incomplete  FUNGUS CULTURE W SMEAR     Status: None   Collection Time    07/18/13 12:43 PM      Result Value Ref Range Status    Specimen Description FLUID PERICARDIAL   Final   Special Requests PT ON ZINACEF 1.5   Final   Fungal Smear     Final   Value: NO YEAST OR FUNGAL ELEMENTS SEEN     Performed at [facility]   Culture     Final   Value: CULTURE IN PROGRESS FOR FOUR WEEKS     Performed at [facility]   Report Status PENDING   Incomplete  ANAEROBIC CULTURE     Status: None   Collection Time    07/18/13 12:43 PM      Result Value Ref Range Status   Specimen Description FLUID PERICARDIAL   Final   Special Requests PT ON ZINACEF 1.5   Final   Gram Stain PENDING   Incomplete   Culture     Final   Value: NO ANAEROBES ISOLATED; CULTURE IN PROGRESS FOR 5 DAYS     Performed at [facility]   Report Status PENDING   Incomplete  GRAM STAIN     Status: None   Collection Time    07/18/13 12:43 PM      Result Value Ref Range Status   Specimen Description FLUID PERICARDIAL   Final   Special Requests PT ON ZINACEF 1.5   Final   Gram Stain     Final   Value: RARE WBC PRESENT,BOTH PMN AND MONONUCLEAR     NO ORGANISMS SEEN   Report Status 07/18/2013 FINAL   Final  BODY FLUID CULTURE     Status: None   Collection Time    07/18/13 12:43 PM      Result Value Ref Range Status   Specimen Description FLUID PERICARDIAL   Final   Special Requests FLUID ON SWAB PATIEN ON FOLLOWING ZINACEF 1.5  Final   Gram Stain     Final   Value: RARE WBC PRESENT,BOTH PMN AND MONONUCLEAR     NO ORGANISMS SEEN     Performed at [facility]   Culture     Final   Value: NO GROWTH 3 DAYS     Performed at [facility]   Report Status 07/21/2013 FINAL   Final  FUNGUS CULTURE W SMEAR     Status: None   Collection Time    07/18/13 12:43 PM      Result Value Ref Range Status   Specimen Description FLUID PERICARDIAL   Final   Special Requests PT ON ZINACEF 1.5 FLUID ON SWAB   Final   Fungal Smear     Final   Value: NO YEAST OR FUNGAL ELEMENTS SEEN     Performed at [facility]   Culture      Final   Value: CULTURE IN PROGRESS FOR FOUR WEEKS     Performed at [facility]   Report Status PENDING   Incomplete  AFB CULTURE WITH SMEAR     Status: None   Collection Time    07/18/13 12:43 PM      Result Value Ref Range Status   Specimen Description FLUID PERICARDIAL   Final   Special Requests PT ON ZINACEF 1.5 FLUID ON SWAB   Final   ACID FAST SMEAR     Final   Value: NO ACID FAST BACILLI SEEN     Performed at [facility]   Culture     Final   Value: CULTURE WILL BE EXAMINED FOR 6 WEEKS BEFORE ISSUING A FINAL REPORT     Performed at [facility]   Report Status PENDING   Incomplete  ANAEROBIC CULTURE     Status: None   Collection Time    07/18/13 12:43 PM      Result Value Ref Range Status   Specimen Description FLUID PERICARDIAL   Final   Special Requests FLUID ON SWAB   Final   Gram Stain     Final   Value: RARE WBC PRESENT,BOTH PMN AND MONONUCLEAR     NO ORGANISMS SEEN     Performed at [facility]   Culture     Final   Value: NO ANAEROBES ISOLATED; CULTURE IN PROGRESS FOR 5 DAYS     Performed at [facility]   Report Status PENDING   Incomplete  VIRAL CULTURE VIRC     Status: None   Collection Time    07/18/13 12:43 PM      Result Value Ref Range Status   Specimen Description FLUID PERICARDIAL   Final   Special Requests NONE   Final   Culture     Final   Value: Culture has been initiated.     Note:    The current Enterovirus culture system does not detect Enterovirus D68. If Enterovirus D68 is suspected, please call client services to add the test for Enterovirus PCR (Test code [phone]).     Performed at [facility]   Report Status PENDING   Incomplete     Studies: Dg Chest Port 1 View  07/21/2013   CLINICAL DATA:  Pericardial window.  EXAM: PORTABLE CHEST - 1 VIEW  COMPARISON:  DG CHEST PORT 1VSAME DAY dated 07/20/2013; CT ANGIO CHEST W/CM &/OR WO/CM dated 07/16/2013  FINDINGS: Mediastinum hilar structures are  normal. Right IJ line in stable position. Stable cardiomegaly  and pulmonary vascularity. Mild left base subsegmental atelectasis. No pneumothorax.  IMPRESSION: 1. Right IJ line in stable position. 2. Stable cardiomegaly, no CHF. 3. Mild left lung base atelectasis   Electronically Signed   By: Marcello Moores  Register   On: 07/21/2013 10:30   Dg Chest Port 1v Same Day  07/20/2013   CLINICAL DATA:  68 year old male status post pericardial window for effusion. Initial encounter.  EXAM: PORTABLE CHEST - 1 VIEW SAME DAY  COMPARISON:  07/18/2013 and earlier.  FINDINGS: Portable AP upright view at 0722 hr. Stable right IJ central line. Stable cardiac size and mediastinal contours. No pneumothorax or edema. Continued retrocardiac opacity but low the less veiling opacity at the lung bases.  IMPRESSION: 1. Stable cardiac silhouette since 07/18/2013. 2. Suspect regressed small pleural effusions but continued lower lobe collapse or consolidation.   Electronically Signed   By: Lars Pinks M.D.   On: 07/20/2013 13:28    Scheduled Meds: . calcium carbonate  250 mg Oral TID WC  . colchicine  0.6 mg Oral BID  . finasteride  5 mg Oral Daily  . furosemide  20 mg Oral Daily  . gabapentin  100 mg Oral BID  . LORazepam  0.5 mg Oral Daily  . LORazepam  1 mg Oral QHS  . metoprolol tartrate  25 mg Oral BID  . multivitamins with iron  1 tablet Oral Daily  . risperiDONE  0.5 mg Oral QHS  . Rivaroxaban  15 mg Oral BID WC  . sertraline  150 mg Oral Q breakfast  . sodium chloride  10-40 mL Intravenous Q12H  . sodium chloride  3 mL Intravenous Q12H  . vitamin B-12  1,000 mcg Oral Daily   Continuous Infusions: . diltiazem (CARDIZEM) infusion 5 mg/hr (07/21/13 1821)    Principal Problem:   CHF (congestive heart failure) Active Problems:   HYPERLIPIDEMIA   HTN (hypertension)   SLEEP APNEA   Morbid obesity   Morbid obesity with BMI of 45.0-49.9, adult   H/O laparoscopic adjustable gastric banding & hiatal hernia repair  04/08/13   Pericardial effusion   Pericarditis   Post viral syndrome   Pulmonary embolism   Hepatitis   Atrial fibrillation   Pulmonary embolus    Time spent: 25 min    Hosie Poisson  [facility] Pager [phone]  If 7PM-7AM, please contact night-coverage at [email or web address], password [facility] 07/21/2013, 6:34 PM  LOS: 5 days

## 2013-07-21 NOTE — Progress Notes (Signed)
Page sent notifying MD of Hgb 8.3 and another maroon color stool. Awaiting any further orders. CBC to be drawn in the am.

## 2013-07-21 NOTE — Progress Notes (Signed)
Dr. Karleen Hampshire notified of bloody stool, hemoccult slide to be sent.  Also notified of rhythm change to a fib rvr. Orders received.  Will continue to monitor pt closely.

## 2013-07-21 NOTE — Progress Notes (Signed)
Dr. Harl Bowie notified of pt rhythm remaining in a fib rvr with blood pressure too low to rebolus with cardizem.  Orders received.  Will continue to monitor pt closely.

## 2013-07-21 NOTE — Progress Notes (Addendum)
[facility] DAILY ICU PROGRESS NOTE                   [address]            [place] [address]          [phone]   3 Days Post-Op Procedure(s) (LRB): SUBXYPHOID PERICARDIAL WINDOW (N/A) INTRAOPERATIVE TRANSESOPHAGEAL ECHOCARDIOGRAM (N/A)  Total Length of Stay:  LOS: 5 days   Subjective: Feels well, breathing stable, +BM.  Objective: Vital signs in last 24 hours: Temp:  [98.4 F (36.9 C)-99.1 F (37.3 C)] 98.4 F (36.9 C) (04/20 0700) Pulse Rate:  [62-89] 89 (04/20 0700) Cardiac Rhythm:  [-] Normal sinus rhythm (04/20 0800) Resp:  [16-32] 25 (04/20 0434) BP: (95-123)/(54-89) 123/89 mmHg (04/20 0700) SpO2:  [93 %-100 %] 98 % (04/20 0700)  Filed Weights   07/16/13 2245 07/17/13 2031 07/20/13 0425  Weight: 303 lb 12.8 oz (137.803 kg) 304 lb 10.8 oz (138.2 kg) 307 lb 12.2 oz (139.6 kg)    Weight change:    Hemodynamic parameters for last 24 hours: CVP:  [3 mmHg-8 mmHg] 4 mmHg  Intake/Output from previous day: 04/19 0701 - 04/20 0700 In: -  Out: 4115 [Urine:4075; Chest Tube:40]  Intake/Output this shift:    Current Meds: Scheduled Meds: . calcium carbonate  250 mg Oral TID WC  . colchicine  0.6 mg Oral BID  . finasteride  5 mg Oral Daily  . furosemide  20 mg Oral Daily  . gabapentin  100 mg Oral BID  . LORazepam  0.5 mg Oral Daily  . LORazepam  1 mg Oral QHS  . metoprolol succinate  12.5 mg Oral Daily  . multivitamins with iron  1 tablet Oral Daily  . risperiDONE  0.5 mg Oral QHS  . Rivaroxaban  15 mg Oral BID WC  . sertraline  150 mg Oral Q breakfast  . vitamin B-12  1,000 mcg Oral Daily   Continuous Infusions: . dextrose 5 % and 0.45% NaCl 50 mL/hr at 07/19/13 0800   PRN Meds:.bisacodyl, fentaNYL, HYDROmorphone (DILAUDID) injection, ketorolac, ondansetron (ZOFRAN) IV, ondansetron  Physical Exam: General appearance: alert, cooperative and no distress Heart: regular rate and rhythm Lungs: clear to auscultation bilaterally Wound: Clean  and dry Chest tube: no leak, minimal output'  Lab Results: CBC: Recent Labs  07/20/13 0420 07/21/13 0457  WBC 14.0* 17.6*  HGB 10.4* 10.6*  HCT 32.3* 32.5*  PLT 304 430*   BMET:  Recent Labs  07/20/13 0420 07/21/13 0457  NA 139 144  K 4.1 3.8  CL 105 104  CO2 23 24  GLUCOSE 103* 105*  BUN 42* 67*  CREATININE 1.03 1.01  CALCIUM 8.2* 8.3*    PT/INR: No results found for this basename: LABPROT, INR,  in the last 72 hours Radiology: Dg Chest Port 1v Same Day  07/20/2013   CLINICAL DATA:  68 year old male status post pericardial window for effusion. Initial encounter.  EXAM: PORTABLE CHEST - 1 VIEW SAME DAY  COMPARISON:  07/18/2013 and earlier.  FINDINGS: Portable AP upright view at 0722 hr. Stable right IJ central line. Stable cardiac size and mediastinal contours. No pneumothorax or edema. Continued retrocardiac opacity but low the less veiling opacity at the lung bases.  IMPRESSION: 1. Stable cardiac silhouette since 07/18/2013. 2. Suspect regressed small pleural effusions but continued lower lobe collapse or consolidation.   Electronically Signed   By: Lars Pinks M.D.   On: 07/20/2013 13:28  Assessment/Plan: S/P Procedure(s) (LRB): SUBXYPHOID PERICARDIAL WINDOW (N/A) INTRAOPERATIVE TRANSESOPHAGEAL ECHOCARDIOGRAM (N/A) Will check CXR this am.  Hopefully can d/c CT later since minimal drainage. Intra-op cultures negative so far.  Coolidge Breeze 07/21/2013 9:29 AM  patient feels poorly, with loose stools and maroon  Patient pale and clammy Now in rapid  afib Would nor transfer out of unit, leave central line in Have ordered stat CBC Nurse contacting Cardiology and Hospital ist So far evidence of pericarditis but cultures negative and no evidence of malignancy I have seen and examined [name] and agree with the above assessment  and plan.  Grace Isaac MD Beeper [phone] Office [phone] 07/21/2013 6:00 PM

## 2013-07-21 NOTE — Plan of Care (Signed)
Problem: Phase I Progression Outcomes Goal: Initial discharge plan identified Outcome: Completed/Met Date Met:  07/21/13 Home with wife.  Problem: Phase II Progression Outcomes Goal: Discharge plan established Outcome: Completed/Met Date Met:  07/21/13 Home with wife.

## 2013-07-21 NOTE — Progress Notes (Signed)
Pt has taken CPAP mask off due to being uncomfortable. RT attempted to tighten mask for a good seal. With that unsuccessful attempt RT changed pt to a nasal mask for more comfort and less leaking around mask. Pt still unable to tolerate mask. RT will continue to monitor.

## 2013-07-21 NOTE — Progress Notes (Signed)
Contacted by nursing for patient in afib with RVR.  EKG at 630 pm shows afib with elevated rate, non-specific ST/T changes. Order had already been placed for dilt drip, after IV bolus of 10mg  blood pressure was soft, currently on dilt at 5 with bp 94/63 with heart rate in 150s. Chart and labs reviewed, will give digoxin 0.5mg  IV x1, followed by 0.25mg  IVq 6 hrs x 2 doses to complete load. Reassess in AM whether to start on maintence digoxin or note.   Carlyle Dolly MD

## 2013-07-21 NOTE — Progress Notes (Signed)
Patient ID: [name], male   DOB: 04-27-1945, 69 y.o.   MRN: [id number]    SUBJECTIVE: Patient remains in NSR.  Chest tube still in place without much drainage.  No complaints. Had many bowel movements overnight. No bleeding. Usually has issues with constipation.    Scheduled Meds: . calcium carbonate  250 mg Oral TID WC  . colchicine  0.6 mg Oral BID  . finasteride  5 mg Oral Daily  . furosemide  20 mg Oral Daily  . gabapentin  100 mg Oral BID  . LORazepam  0.5 mg Oral Daily  . LORazepam  1 mg Oral QHS  . metoprolol succinate  12.5 mg Oral Daily  . multivitamins with iron  1 tablet Oral Daily  . risperiDONE  0.5 mg Oral QHS  . Rivaroxaban  15 mg Oral BID WC  . sertraline  150 mg Oral Q breakfast  . sodium chloride  10 mL Intravenous Q12H  . vitamin B-12  1,000 mcg Oral Daily   Continuous Infusions: . dextrose 5 % and 0.45% NaCl 50 mL/hr at 07/19/13 0800   PRN Meds:.bisacodyl, fentaNYL, HYDROmorphone (DILAUDID) injection, ketorolac, ondansetron (ZOFRAN) IV, ondansetron, sodium chloride    Filed Vitals:   07/21/13 0400 07/21/13 0434 07/21/13 0700 07/21/13 1000  BP:  104/64 123/89 112/77  Pulse:  73 89   Temp: 98.5 F (36.9 C)  98.4 F (36.9 C)   TempSrc: Oral  Oral   Resp:  25    Height:      Weight:      SpO2:  100% 98%     Intake/Output Summary (Last 24 hours) at 07/21/13 1106 Last data filed at 07/21/13 0700  Gross per 24 hour  Intake      0 ml  Output   2115 ml  Net  -2115 ml    LABS: Basic Metabolic Panel:  Recent Labs  07/20/13 0420 07/21/13 0457  NA 139 144  K 4.1 3.8  CL 105 104  CO2 23 24  GLUCOSE 103* 105*  BUN 42* 67*  CREATININE 1.03 1.01  CALCIUM 8.2* 8.3*   Liver Function Tests:  Recent Labs  07/20/13 0420 07/21/13 0457  AST 77* 49*  ALT 118* 97*  ALKPHOS 349* 359*  BILITOT 0.6 0.6  PROT 5.9* 6.3  ALBUMIN 2.3* 2.5*    CBC:  Recent Labs  07/20/13 0420 07/21/13 0457  WBC 14.0* 17.6*  HGB 10.4* 10.6*  HCT 32.3*  32.5*  MCV 92.0 91.8  PLT 304 430*   RADIOLOGY: Dg Chest 2 View  07/15/2013   CLINICAL DATA:  Dyspnea and chest pain.  EXAM: CHEST  2 VIEW  COMPARISON:  07/13/2013 and 09/26/2012  FINDINGS: Lungs are adequately inflated with continued evidence of a small left pleural effusion with possible slight interval improvement. There is mild stable cardiomegaly. Remainder the exam is unchanged.  IMPRESSION: Continued small left pleural effusion with possible slight interval improvement.   Electronically Signed   By: Marin Olp M.D.   On: 07/15/2013 11:32   Dg Chest 2 View  07/13/2013   CLINICAL DATA:  Weakness and chest pain.  EXAM: CHEST - 2 VIEW  COMPARISON:  DG CHEST 2 VIEW dated 09/26/2012; DG THORACIC SPINE dated 01/19/2011  FINDINGS: The heart is mildly enlarged. Mild interstitial and pulmonary vascular prominence identified without evidence of overt edema. There is a small left pleural effusion. Bony structures show stable mild degenerative disease of the thoracic spine.  IMPRESSION: Cardiomegaly and pulmonary interstitial  prominence with small left pleural effusion. No overt edema is identified   Electronically Signed   By: Aletta Edouard M.D.   On: 07/13/2013 18:16   Ct Angio Chest Pe W/cm &/or Wo Cm  07/16/2013   CLINICAL DATA:  Shortness of breath, neck/back pain, elevated D-dimer, evaluate for PE. Lap band surgery in January.  EXAM: CT ANGIOGRAPHY CHEST WITH CONTRAST  TECHNIQUE: Multidetector CT imaging of the chest was performed using the standard protocol during bolus administration of intravenous contrast. Multiplanar CT image reconstructions and MIPs were obtained to evaluate the vascular anatomy.  CONTRAST:  75mL OMNIPAQUE IOHEXOL 350 MG/ML SOLN  COMPARISON:  None.  FINDINGS: Tiny subsegmental filling defects within branches of the right lower lobe pulmonary artery (series 5/ images 169, 170, and 189), suspicious for pulmonary emboli. Overall clot burden is small. No findings to suggest right  heart strain (RV-to-LV ratio 0.87).  Small to moderate left and small right pleural effusions. No frank interstitial edema. No pneumothorax.  Visualized right thyroid is notable for a suspected 2.2 cm thyroid nodule (series 4/ image 1), incompletely visualized.  The heart is top-normal in size. Small to moderate pericardial effusion with a mildly irregular appearance. Mild atherosclerotic calcifications of the aortic arch.  No suspicious mediastinal, hilar, or axillary lymphadenopathy.  Visualized upper abdomen is notable for a laparoscopic band in satisfactory position.  Degenerative changes of the visualized thoracolumbar spine.  Review of the MIP images confirms the above findings.  IMPRESSION: Suspected tiny subsegmental pulmonary emboli within branches of the right lower lobe pulmonary artery. Overall clot burden is small.  Small to moderate left and small right pleural effusions. No frank interstitial edema.  Small to moderate pericardial effusion. Given the irregular appearance, correlate for pericarditis.  Critical Value/emergent results were called by telephone at the time of interpretation on 07/16/2013 at 5:28 PM to Dr. Gwyndolyn Saxon HOPPER, who verbally acknowledged these results.   Electronically Signed   By: Julian Hy M.D.   On: 07/16/2013 17:29   US Abdomen Limited  07/13/2013   CLINICAL DATA:  Increased liver function tests, right upper quadrant pain  EXAM: US ABDOMEN LIMITED - RIGHT UPPER QUADRANT  COMPARISON:  None.  FINDINGS: Gallbladder:  Surgically at  Common bile duct:  Diameter: 7 mm  Liver:  No focal abnormalities.  IMPRESSION: No acute abnormalities   Electronically Signed   By: Skipper Cliche M.D.   On: 07/13/2013 20:37   Dg Chest Port 1 View  07/18/2013   CLINICAL DATA:  Cough.  Status post pericardial window procedure.  EXAM: PORTABLE CHEST - 1 VIEW  COMPARISON:  CT ANGIO CHEST W/CM &/OR WO/CM dated 07/16/2013; DG CHEST 2 VIEW dated 07/15/2013  FINDINGS: Right IJ line tip: Upper  SVC. Moderately enlarged cardiopericardial silhouette. Indistinct pulmonary vasculature compatible with pulmonary venous hypertension.  Right costophrenic angle excluded. No pneumothorax or significant pneumomediastinum observed. Retrocardiac density suggests mild atelectasis in the left lower lobe.  IMPRESSION: 1. Continued enlargement of the cardiopericardial silhouette, with underlying pulmonary venous hypertension. 2. Mild atelectasis in the left lower lobe. 3. Right IJ line tip:  Upper SVC.   Electronically Signed   By: Sherryl Barters M.D.   On: 07/18/2013 15:01    PHYSICAL EXAM General: NAD Neck: Thick, JVP ?7-8, no thyromegaly or thyroid nodule.  Lungs: Clear to auscultation bilaterally with normal respiratory effort. CV: Nondisplaced PMI.  Heart regular S1/S2, no S3/S4, no murmur.  Trace ankle edema.Pericardial drain in place. Abdomen: Soft, nontender, no hepatosplenomegaly, no  distention.  Neurologic: Alert and oriented x 3.  Psych: Normal affect. Extremities: No clubbing or cyanosis.   TELEMETRY: Reviewed telemetry pt in NSR  ASSESSMENT AND PLAN: 68 yo with history of HTN s/p lap band in 1/15 was admitted with atrial fibrillation/RVR and the finding of a pulmonary embolus.  CTA that found PE also showed a moderate to large pericardial effusion.  Echo showed no definite tamponade, but patient had pericardial window 4/18 for diagnostic and therapeutic purposes.    1. Atrial fibrillation: Paroxsysmal. This may be secondary to PE or pericardial effusion.  Now back in sinus bradycardia.   - Started back on Xarelto 4/18 PM  - Continue low dose Toprol XL.  2. Pericardial effusion: Etiology is uncertain. Pericardial effusion initially noted on CTA when PE was detected.  Possible prior pericarditis (severe presumed viral infection several months ago), ?worsened by anticoagulation for PE.  Pericardial window 4/18 for diagnosis and treatment.  Gram stain negative, pathology pending. He is on  colchicine, continue x 3 months. Colchicine may be contributing to increased BM. Also watch for any signs of bleeding (BUN is increased).   - Hopefully drain DC. Per TCTS team. Note reviewed.   - ? DC Right IJ if stable.  3. Pulmonary embolus: Again, etiology uncertain.  Had surgery but back in 1/15.  So far hypercoagulable workup negative.  He is now back on Xarelto.  4. CHF: Acute diastolic CHF.  CVP 4 today.  I will transition Lasix to po 20mg .  Candee Furbish 07/21/2013 11:06 AM

## 2013-07-21 NOTE — Progress Notes (Signed)
Kerin Ransom, [place] notified of pt still in a fib with rvr, unable to give second bolus of cardizem as blood pressure too low.   Requested that cardiologist be paged.  Will continue to monitor pt closely.

## 2013-07-21 NOTE — Progress Notes (Signed)
Dr. Karleen Hampshire called RN to update plan.  GI has been consulted, and will see the pt in the morning; monitor cbcs and start IV protonix.  Will continue to monitor pt closely.

## 2013-07-21 NOTE — Progress Notes (Signed)
Pt had large black stool with blood noted in toilet.   While moving to chair, pt went into a fib with rvr.  Pt diaphoretic, nauseated, denies chest pain.  Dr. Servando Snare here rounding, orders received.  Will continue to monitor pt closely.

## 2013-07-21 NOTE — Progress Notes (Signed)
Kerin Ransom, [place] notified of pt rhythm change to afib with rvr, diaphoretic, nauseated, denies chest pain.  Orders received.  Will continue to monitor pt closely.

## 2013-07-22 ENCOUNTER — Telehealth (INDEPENDENT_AMBULATORY_CARE_PROVIDER_SITE_OTHER): Payer: Self-pay

## 2013-07-22 ENCOUNTER — Encounter (HOSPITAL_COMMUNITY): Payer: Self-pay

## 2013-07-22 ENCOUNTER — Encounter (HOSPITAL_COMMUNITY)
Admission: EM | Disposition: A | Discharge status: Home / Self Care | Payer: Self-pay | Source: Home / Self Care | Attending: Internal Medicine

## 2013-07-22 ENCOUNTER — Inpatient Hospital Stay (HOSPITAL_COMMUNITY): Payer: Medicare Other

## 2013-07-22 DIAGNOSIS — D62 Acute posthemorrhagic anemia: Secondary | ICD-10-CM

## 2013-07-22 DIAGNOSIS — Z7901 Long term (current) use of anticoagulants: Secondary | ICD-10-CM

## 2013-07-22 DIAGNOSIS — K921 Melena: Secondary | ICD-10-CM

## 2013-07-22 DIAGNOSIS — K254 Chronic or unspecified gastric ulcer with hemorrhage: Secondary | ICD-10-CM

## 2013-07-22 DIAGNOSIS — Z6841 Body Mass Index (BMI) 40.0 and over, adult: Secondary | ICD-10-CM

## 2013-07-22 HISTORY — PX: ESOPHAGOGASTRODUODENOSCOPY: SHX5428

## 2013-07-22 HISTORY — DX: Long term (current) use of anticoagulants: Z79.01

## 2013-07-22 LAB — CBC
HCT: 23.5 % — ABNORMAL LOW (ref 39.0–52.0)
Hemoglobin: 7.8 g/dL — ABNORMAL LOW (ref 13.0–17.0)
MCH: 30 pg (ref 26.0–34.0)
MCHC: 33.2 g/dL (ref 30.0–36.0)
MCV: 90.4 fL (ref 78.0–100.0)
Platelets: 437 10*3/uL — ABNORMAL HIGH (ref 150–400)
RBC: 2.6 MIL/uL — ABNORMAL LOW (ref 4.22–5.81)
RDW: 14.3 % (ref 11.5–15.5)
WBC: 21.5 10*3/uL — ABNORMAL HIGH (ref 4.0–10.5)

## 2013-07-22 LAB — HEMOGLOBIN AND HEMATOCRIT, BLOOD
HCT: 24.5 % — ABNORMAL LOW (ref 39.0–52.0)
Hemoglobin: 8.1 g/dL — ABNORMAL LOW (ref 13.0–17.0)

## 2013-07-22 LAB — COMPREHENSIVE METABOLIC PANEL
ALK PHOS: 221 U/L — AB (ref 39–117)
ALT: 70 U/L — ABNORMAL HIGH (ref 0–53)
AST: 41 U/L — AB (ref 0–37)
Albumin: 2.3 g/dL — ABNORMAL LOW (ref 3.5–5.2)
BUN: 59 mg/dL — ABNORMAL HIGH (ref 6–23)
CALCIUM: 8.1 mg/dL — AB (ref 8.4–10.5)
CO2: 24 meq/L (ref 19–32)
Chloride: 108 mEq/L (ref 96–112)
Creatinine, Ser: 1.01 mg/dL (ref 0.50–1.35)
GFR calc non Af Amer: 75 mL/min — ABNORMAL LOW (ref >90)
GFR, EST AFRICAN AMERICAN: 87 mL/min — AB (ref >90)
Glucose, Bld: 134 mg/dL — ABNORMAL HIGH (ref 70–99)
Potassium: 3.7 mEq/L (ref 3.7–5.3)
Sodium: 145 mEq/L (ref 137–147)
TOTAL PROTEIN: 5.4 g/dL — AB (ref 6.0–8.3)
Total Bilirubin: 0.4 mg/dL (ref 0.3–1.2)

## 2013-07-22 LAB — BODY FLUID CULTURE: Culture: NO GROWTH

## 2013-07-22 LAB — PREPARE RBC (CROSSMATCH)

## 2013-07-22 SURGERY — EGD (ESOPHAGOGASTRODUODENOSCOPY)
Anesthesia: Moderate Sedation

## 2013-07-22 MED ORDER — SODIUM CHLORIDE 0.9 % IV SOLN
INTRAVENOUS | Status: DC
  Administered 2013-07-22: 12:00:00 via INTRAVENOUS

## 2013-07-22 MED ORDER — MIDAZOLAM HCL 5 MG/ML IJ SOLN
INTRAMUSCULAR | Status: AC
  Filled 2013-07-22: qty 2

## 2013-07-22 MED ORDER — BUTAMBEN-TETRACAINE-BENZOCAINE 2-2-14 % EX AERO
INHALATION_SPRAY | CUTANEOUS | Status: DC | PRN
  Administered 2013-07-22: 1 via TOPICAL

## 2013-07-22 MED ORDER — FENTANYL CITRATE 0.05 MG/ML IJ SOLN
INTRAMUSCULAR | Status: DC | PRN
  Administered 2013-07-22 (×2): 25 ug via INTRAVENOUS

## 2013-07-22 MED ORDER — MIDAZOLAM HCL 10 MG/2ML IJ SOLN
INTRAMUSCULAR | Status: DC | PRN
  Administered 2013-07-22 (×3): 2 mg via INTRAVENOUS

## 2013-07-22 MED ORDER — FENTANYL CITRATE 0.05 MG/ML IJ SOLN
INTRAMUSCULAR | Status: AC
  Filled 2013-07-22: qty 2

## 2013-07-22 MED ORDER — SODIUM CHLORIDE 0.9 % IV SOLN: INTRAVENOUS | Status: DC

## 2013-07-22 NOTE — Telephone Encounter (Signed)
Azucena Freed RN /DR.Reather Converse states patient was H. Pylori positive and was advised he would be tx after lap band sx ,please advise  Sararh# [phone]

## 2013-07-22 NOTE — Progress Notes (Signed)
Saw patient at [facility] today, after noting his readmission.  Spoke with patient about progress with Lap band and current diet.  Patient expressed interest in further follow up with nutrition after hospital discharge.  Encouraged follow-up with nutrition, provided patient with contact information and encouraged to call if any problems with Lap Band are encountered. Patient very pleased to see members of bariatric team.  Will continue to monitor.

## 2013-07-22 NOTE — Progress Notes (Signed)
Dr. Colon Flattery notified of Hgb 7.8 and Cardizem off because of low BP's. Patient has just converted to NSR. No new orders at this time.

## 2013-07-22 NOTE — Progress Notes (Addendum)
INITIAL NUTRITION ASSESSMENT  DOCUMENTATION CODES Per approved criteria  -Not Applicable   INTERVENTION: 1.  Modify diet; resume PO diet once medically appropriate per MD discretion. 2.  Supplements; wife has brought protein shakes from home (held after MD discussion yesterday).  Resume per MD. 3.  General healthful diet; RD will reach out to bariatric RD for coordination of home diet for pt.   NUTRITION DIAGNOSIS: Inadequate oral intake related to poor appetite, specialized diet as evidenced by pt anorexic s/p lap band surgery, eating only small portions of protein foods at home.   Monitor:  1.  Food/Beverage; pt meeting >/=90% estimated needs with tolerance. 2.  Wt/wt change; monitor trends 3.  Gastrointestinal; pt to have normal bowel function with improvement in LFTs  Reason for Assessment: consult  68 y.o. male  Admitting Dx: CHF (congestive heart failure)  ASSESSMENT: Pt admitted with shortness of breath. Pt found to have pericarditis, also developed bloody stool.  Pt s/p endoscopy today which showed small ulcer.  Pt is known H. Pylori +.  RD met with pt and wife who have several questions.  Pt states he has been following bariatric diet closely since surgery in Jan. Pt reports following dietary recall: Breakfast: 8oz Premier Protein shake (160 kcal, 5g CHO, 30g protein), 48-72 oz of decaf coffee flavored with SF french vanilla coffee creamer.  Lunch:  4-5 oz of meat (boiled chicken, deli meat, shrimp, hamburger patty) or large [employer] chili Snack: 12-24 oz decaf coffee flavored with SF French vanilla creamer Late afternoon: 8 oz Premier Protein shake Bedtime snack: 12 oz coffee flavored with SF french vanilla creamer + 1.5 slice cheese.  RD discussed with MD who reports lab abnormalities likely related to ulcer/GI bleed.  States pt can likely resume home diet.  RD will reach out to bariatric RD for any recommendations r/t home diet.  Pt does take several supplements.  He  cannot list them off the top of his head- states they should be in his chart.   Pt with significant wt loss s/p surgery.  Pt has lost 39 lbs since day of surgery (11% of 333 lbs pt weighed on day of surgery).  Pt is unsure of diet and wt loss goals at this time.  He does state he would like to lose another 100 lbs.   Height: Ht Readings from Last 1 Encounters:  07/17/13 6' (1.829 m)    Weight: Wt Readings from Last 1 Encounters:  07/22/13 294 lb 12.1 oz (133.7 kg)    Ideal Body Weight: 80.9 kg  % Ideal Body Weight: 165%  Wt Readings from Last 10 Encounters:  07/22/13 294 lb 12.1 oz (133.7 kg)  07/22/13 294 lb 12.1 oz (133.7 kg)  07/22/13 294 lb 12.1 oz (133.7 kg)  07/15/13 307 lb (139.254 kg)  07/10/13 305 lb 8 oz (138.574 kg)  07/03/13 311 lb 6.4 oz (141.25 kg)  06/05/13 313 lb (141.976 kg)  06/03/13 319 lb 12.8 oz (145.06 kg)  05/23/13 318 lb (144.244 kg)  05/08/13 322 lb 12.8 oz (146.421 kg)    Usual Body Weight: 360 lbs  % Usual Body Weight: 81%  BMI:  Body mass index is 39.97 kg/(m^2).  Estimated Nutritional Needs: Kcal: 1860-2050 (for ongoing weight loss) Protein: 145-161g Fluid: per MD  Skin: non-pitting edema  Diet Order: Cardiac  EDUCATION NEEDS: -Education needs addressed   Intake/Output Summary (Last 24 hours) at 07/22/13 1617 Last data filed at 07/22/13 1200  Gross per 24 hour  Intake  491.58 ml  Output   1400 ml  Net -908.42 ml    Last BM: 4/20  Labs:   Recent Labs Lab 07/20/13 0420 07/21/13 0457 07/22/13 0531  NA 139 144 145  K 4.1 3.8 3.7  CL 105 104 108  CO2 '23 24 24  ' BUN 42* 67* 59*  CREATININE 1.03 1.01 1.01  CALCIUM 8.2* 8.3* 8.1*  GLUCOSE 103* 105* 134*    CBG (last 3)  No results found for this basename: GLUCAP,  in the last 72 hours  Scheduled Meds: . [facility] HOLD] calcium carbonate  250 mg Oral TID WC  . [facility] HOLD] colchicine  0.6 mg Oral BID  . [MAR HOLD] finasteride  5 mg Oral Daily  . [facility] HOLD] furosemide  20  mg Oral Daily  . [facility] HOLD] gabapentin  100 mg Oral BID  . [MAR HOLD] LORazepam  0.5 mg Oral Daily  . [MAR HOLD] LORazepam  1 mg Oral QHS  . [facility] HOLD] metoprolol tartrate  25 mg Oral BID  . [MAR HOLD] multivitamins with iron  1 tablet Oral Daily  . [MAR HOLD] pantoprazole (PROTONIX) IV  80 mg Intravenous Q24H  . [MAR HOLD] risperiDONE  0.5 mg Oral QHS  . [facility] HOLD] sertraline  150 mg Oral Q breakfast  . [MAR HOLD] sodium chloride  10-40 mL Intravenous Q12H  . [MAR HOLD] sodium chloride  3 mL Intravenous Q12H  . [facility] HOLD] vitamin B-12  1,000 mcg Oral Daily    Continuous Infusions: . sodium chloride 10 mL/hr at 07/22/13 1100  . sodium chloride    . [MAR HOLD] diltiazem (CARDIZEM) infusion Stopped (07/22/13 0530)    Past Medical History  Diagnosis Date  . Benign prostatic hypertrophy     several prostate biopsies neg for cancer.   . Hypertension   . Gout   . Depression   . FH: colonic polyps 2010  . Morbid obesity   . Complication of anesthesia     MORPHINE CAUSES HALLUCINATIONS  . Asthma     childhood asthma  . Numbness     feet / legs  . History of kidney stones   . Kidney cysts   . Sleep apnea     USES C PAP  . Bilateral hip pain     Past Surgical History  Procedure Laterality Date  . Total knee replacment      bilat  . Cholecystectomy  1989  . Trigger finger repair      also lue, cts surgically repaired  . Renal calculi    . Tonsillectomy    . Lithotripsy    . Arthroscopy knee w/ drilling    . Breath tek h pylori N/A 12/23/2012    Procedure: BREATH TEK H PYLORI;  Surgeon: Gayland Curry, MD;  Location: [facility];  Service: General;  Laterality: N/A;  . Wisdom tooth extraction    . Laparoscopic gastric banding with hiatal hernia repair N/A 04/08/2013    Procedure: LAPAROSCOPIC GASTRIC BANDING WITH possible  HIATAL HERNIA REPAIR;  Surgeon: Gayland Curry, MD;  Location: [facility];  Service: General;  Laterality: N/A;  . Subxyphoid pericardial window N/A 07/18/2013     Procedure: SUBXYPHOID PERICARDIAL WINDOW;  Surgeon: Grace Isaac, MD;  Location: [facility];  Service: Thoracic;  Laterality: N/A;  . Intraoperative transesophageal echocardiogram N/A 07/18/2013    Procedure: INTRAOPERATIVE TRANSESOPHAGEAL ECHOCARDIOGRAM;  Surgeon: Grace Isaac, MD;  Location: [facility];  Service: Open Heart Surgery;  Laterality: N/A;  Brynda Greathouse, MS RD LDN Clinical Inpatient Dietitian Pager: [phone] Weekend/After hours pager: [phone]

## 2013-07-22 NOTE — Consult Note (Signed)
[facility] Consult: 11:30 AM 07/22/2013  LOS: 6 days    Referring Provider: Dr Karleen Hampshire Primary Care Physician:  [name], MD Primary Gastroenterologist:  Dr. Scarlette Shorts     Reason for Consultation:  GI bleed. Maroon stool and drop in Hgb.    HPI: [name] is a 68 y.o. male. Hs Obesity, OSA (on CPAP), HTN, BPH, Metabollic syndrome, depression.   S/p bariatric lap band and repair of [facility] 04/08/13 (Dr Redmond Pulling), total weight loss of 85# pre and post surgery.  H Pylori breath tech test and serum H Pylori + 12/2012: planned to treat after surgery. Not yet accomplished.  Has had hard to pass stools but no blood since the surgery.  A viral illness caused some now resolved n/v in the weeks following surgery.    Admitted 6 days ago with recent onset of dyspnea, chest pain, mild heart failure, elevated LFTs (unremarkable abdominal u/s).  Seen in ED initially and referred to PMD 4/14.  A 2d echo showed small to moderate pericardial effusion. Started on Xarelto for PE, confirmed by CT scan which also raised concern for pericarditis ( felt not to have pericarditis by hospitalist who felt he had viral pericadial effusion).  Also developed new onset A fib. After xarelto held and temporized with Heparin, he underwent 4/17 pericardial window and drainage of pericardial effusion.   Pt given suppository on Sunday for constipation of one week.  Initially had hard, brown stool, but at 1750 yesterday, began passing black stools on several occasions, last being early this AM. Was hypotensive and having Afib with RVR.   No nausea or abdominal pain. Appetite has been lower than at home where he eats protein rich shakes and protein focused meals along with vegetables.    BUN in 12/2012 was 22, 34 on 07/13/13, rising through 40s to 67  on 4/20.  The creatinine stayed normal throughout.  Hgb went from 10.6 yest to 7.8 this AM, 2 units PRBCs ordered.  Protonix drip initiated at 2100 last night, no loading dose. Alveda Reasons was restarted 4/18 - 4/20. No heparin since 4/18. No ASA since 4/17.  PTA was on 325 ASA daily and used 400 mg Ibuprofen at most once per week.  Per Dr Redmond Pulling lap band is in good position and feels pt will tolerate EGD   Past Medical History  Diagnosis Date  . Benign prostatic hypertrophy     several prostate biopsies neg for cancer.   . Hypertension   . Gout   . Depression   . FH: colonic polyps 2010  . Morbid obesity   . Complication of anesthesia     MORPHINE CAUSES HALLUCINATIONS  . Asthma     childhood asthma  . Numbness     feet / legs  . History of kidney stones   . Kidney cysts   . Sleep apnea     USES C PAP  . Bilateral hip pain     Past Surgical History  Procedure Laterality Date  . Total knee replacment  bilat  . Cholecystectomy  1989  . Trigger finger repair      also lue, cts surgically repaired  . Renal calculi    . Tonsillectomy    . Lithotripsy    . Arthroscopy knee w/ drilling    . Breath tek h pylori N/A 12/23/2012    Procedure: BREATH TEK H PYLORI;  Surgeon: Gayland Curry, MD;  Location: [facility];  Service: General;  Laterality: N/A;  . Wisdom tooth extraction    . Laparoscopic gastric banding with hiatal hernia repair N/A 04/08/2013    Procedure: LAPAROSCOPIC GASTRIC BANDING WITH possible  HIATAL HERNIA REPAIR;  Surgeon: Gayland Curry, MD;  Location: [facility];  Service: General;  Laterality: N/A;  . Subxyphoid pericardial window N/A 07/18/2013    Procedure: SUBXYPHOID PERICARDIAL WINDOW;  Surgeon: Grace Isaac, MD;  Location: [facility];  Service: Thoracic;  Laterality: N/A;  . Intraoperative transesophageal echocardiogram N/A 07/18/2013    Procedure: INTRAOPERATIVE TRANSESOPHAGEAL ECHOCARDIOGRAM;  Surgeon: Grace Isaac, MD;  Location: [facility];  Service: Open  Heart Surgery;  Laterality: N/A;    Prior to Admission medications   Medication Sig Start Date End Date Taking? Authorizing Provider  allopurinol (ZYLOPRIM) 300 MG tablet Take 300 mg by mouth daily with breakfast.    Yes Historical Provider, MD  amLODipine-benazepril (LOTREL) 5-10 MG per capsule Take 1 capsule by mouth daily.   Yes Historical Provider, MD  calcium gluconate 500 MG tablet Take 1 tablet by mouth 3 (three) times daily.   Yes Historical Provider, MD  Cyanocobalamin (VITAMIN B-12) 1000 MCG SUBL Place 1,000 mcg under the tongue every morning.   Yes Historical Provider, MD  finasteride (PROSCAR) 5 MG tablet Take 5 mg by mouth daily.    Yes Historical Provider, MD  furosemide (LASIX) 20 MG tablet Take 1 tablet (20 mg total) by mouth daily. 07/13/13  Yes Wandra Arthurs, MD  gabapentin (NEURONTIN) 100 MG capsule Take 1 capsule (100 mg total) by mouth 2 (two) times daily. 05/06/13  Yes Hendricks Limes, MD  Glucosamine-Chondroit-Vit C-Mn (GLUCOSAMINE 1500 COMPLEX PO) Take 1,500 mg by mouth daily.   Yes Historical Provider, MD  Loperamide-Simethicone (IMODIUM MULTI-SYMPTOM RELIEF) 2-125 MG TABS Take 1-2 tablets by mouth 2 (two) times daily as needed (diarrhea).   Yes Historical Provider, MD  LORazepam (ATIVAN) 0.5 MG tablet Take 0.5-1 mg by mouth 2 (two) times daily. 0.5 mg in the morning and 1 mg at bedtime   Yes Historical Provider, MD  Multiple Vitamin (MULTIVITAMIN) tablet Take 1 tablet by mouth daily. Chewable tablet with iron   Yes Historical Provider, MD  ondansetron (ZOFRAN) 4 MG tablet Take 4 mg by mouth every 8 (eight) hours as needed for nausea or vomiting.   Yes Historical Provider, MD  risperiDONE (RISPERDAL) 0.5 MG tablet Take 1 tablet (0.5 mg total) by mouth at bedtime. 06/03/13  Yes Kathlee Nations, MD  Rivaroxaban (XARELTO) 15 MG TABS tablet Take 1 tablet (15 mg total) by mouth 2 (two) times daily with a meal. 07/15/13  Yes Hendricks Limes, MD  sertraline (ZOLOFT) 100 MG tablet Take  1.5 tablets (150 mg total) by mouth daily with breakfast. 06/03/13  Yes Kathlee Nations, MD  Tetrahydrozoline HCl (VISINE OP) Place 2 drops into both eyes daily as needed (dry eyes).   Yes Historical Provider, MD  traMADol (ULTRAM) 50 MG tablet Take 25 mg by mouth every 6 (six) hours as needed for moderate pain.   Yes Historical  Provider, MD  aspirin 325 MG tablet Take 325 mg by mouth daily.    Historical Provider, MD  Flaxseed, Linseed, (FLAX SEED OIL) 1300 MG CAPS Take 1,300 mg by mouth daily.    Historical Provider, MD  pravastatin (PRAVACHOL) 40 MG tablet Take 40 mg by mouth at bedtime.    Historical Provider, MD    Scheduled Meds: . calcium carbonate  250 mg Oral TID WC  . colchicine  0.6 mg Oral BID  . finasteride  5 mg Oral Daily  . furosemide  20 mg Oral Daily  . gabapentin  100 mg Oral BID  . LORazepam  0.5 mg Oral Daily  . LORazepam  1 mg Oral QHS  . metoprolol tartrate  25 mg Oral BID  . multivitamins with iron  1 tablet Oral Daily  . pantoprazole (PROTONIX) IV  80 mg Intravenous Q24H  . risperiDONE  0.5 mg Oral QHS  . sertraline  150 mg Oral Q breakfast  . sodium chloride  10-40 mL Intravenous Q12H  . sodium chloride  3 mL Intravenous Q12H  . vitamin B-12  1,000 mcg Oral Daily   Infusions: . sodium chloride 10 mL/hr at 07/22/13 1100  . diltiazem (CARDIZEM) infusion Stopped (07/22/13 0530)   PRN Meds: fentaNYL, HYDROmorphone (DILAUDID) injection, ondansetron (ZOFRAN) IV, ondansetron, sodium chloride, sodium chloride   Allergies as of 07/16/2013 - Review Complete 07/16/2013  Allergen Reaction Noted  . Propoxyphene n-acetaminophen Nausea Only   . Codeine    . Morphine    . Penicillins      Family History  Problem Relation Age of Onset  . Depression Mother   . Hypertension Mother   . Heart disease Father     MI  . Depression Brother   . Stroke Paternal Aunt     CVA  . Diabetes Paternal Uncle   . Heart disease Paternal Uncle     MI  . Heart disease Paternal  Grandmother     MI  . Diabetes Cousin     PATERNAL    History   Social History  . Marital Status: Married    Spouse Name: N/A    Number of Children: N/A  . Years of Education: N/A   Occupational History  . Not on file.   Social History Main Topics  . Smoking status: Former Smoker    Types: Cigarettes    Quit date: 08/30/1974  . Smokeless tobacco: Not on file  . Alcohol Use: Yes     Comment: Occasionally; 1 small drink a week  . Drug Use: No  . Sexual Activity: Not on file   Other Topics Concern  . Not on file   Social History Narrative  . No narrative on file    REVIEW OF SYSTEMS: Constitutional:  Weigh loss of 333# pre surgery, 294 # currently, 10 of those lost since admission.  ENT:  No nose bleeds Pulm:  No SOB or cough.  Unable to tolerate the hospital CPAP mask, it is leaking air.  CV  No palpitations, no LE edema. Chest pain improved GU:  No hematuria, no frequency GI:  Per HPI.  No dysphagia Heme:  No hx anemia   Transfusions:  Yes, today Neuro:  No headaches, no peripheral tingling or numbness Derm:  No itching, no rash or sores.  Endocrine:  No sweats or chills.  No polyuria or dysuria Immunization:  Up to date on flu, pneumovax Travel:  None beyond local counties in last few months.  PHYSICAL EXAM: Vital signs in last 24 hours: Filed Vitals:   07/22/13 1100  BP:   Pulse: 73  Temp:   Resp: 16   Wt Readings from Last 3 Encounters:  07/22/13 133.7 kg (294 lb 12.1 oz)  07/22/13 133.7 kg (294 lb 12.1 oz)  07/15/13 139.254 kg (307 lb)    General: obese pt who looks well Head:  No asymmetry or swelling  Eyes:  Non icterus or pallor Ears:  Not HOH  Nose:  No discharge. Mouth:  Clear, moist, good dentition Neck:  No mass, no JVD Lungs:  Clear.  Unlabored breathing Heart: RRR.  No mrg. healthy looking sternal scar Abdomen:  Soft, obese, healed lap band scar.  No HSM.  Active BS. Rectal: black stool, some hard stool in vault   Musc/Skeltl:  no joint swelling or redness Extremities:  No CCE  Neurologic:  No tremor, no limb weakness.  Oriented x3.  Excellent recall of events Skin:  No telangectasia, no sores or rash Tattoos:  none Nodes:  No cervical adenopathy   Psych:  Pleasant, relaxed, sense of humor intact.   Intake/Output from previous day: 04/20 0701 - 04/21 0700 In: 1171.6 [P.O.:900; I.V.:171.6; IV Piggyback:100] Out: 1510 [Urine:1450; Chest Tube:60] Intake/Output this shift: Total I/O In: 40 [I.V.:40] Out: -   LAB RESULTS:  Recent Labs  07/21/13 0457 07/21/13 1800 07/21/13 2210 07/22/13 0531  WBC 17.6* 20.1*  --  21.5*  HGB 10.6* 9.1* 8.3* 7.8*  HCT 32.5* 27.7* 25.5* 23.5*  PLT 430* 420*  --  437*   BMET Lab Results  Component Value Date   NA 145 07/22/2013   NA 144 07/21/2013   NA 139 07/20/2013   K 3.7 07/22/2013   K 3.8 07/21/2013   K 4.1 07/20/2013   CL 108 07/22/2013   CL 104 07/21/2013   CL 105 07/20/2013   CO2 24 07/22/2013   CO2 24 07/21/2013   CO2 23 07/20/2013   GLUCOSE 134* 07/22/2013   GLUCOSE 105* 07/21/2013   GLUCOSE 103* 07/20/2013   BUN 59* 07/22/2013   BUN 67* 07/21/2013   BUN 42* 07/20/2013   CREATININE 1.01 07/22/2013   CREATININE 1.01 07/21/2013   CREATININE 1.03 07/20/2013   CALCIUM 8.1* 07/22/2013   CALCIUM 8.3* 07/21/2013   CALCIUM 8.2* 07/20/2013   LFT  Recent Labs  07/20/13 0420 07/21/13 0457 07/22/13 0531  PROT 5.9* 6.3 5.4*  ALBUMIN 2.3* 2.5* 2.3*  AST 77* 49* 41*  ALT 118* 97* 70*  ALKPHOS 349* 359* 221*  BILITOT 0.6 0.6 0.4   PT/INR No results found for this basename: INR,  PROTIME   Hepatitis Panel No results found for this basename: HEPBSAG, HCVAB, HEPAIGM, HEPBIGM,  in the last 72 hours  RADIOLOGY STUDIES: Dg Chest Port 1 View 07/22/2013   CLINICAL DATA:  Pericardial effusion.  Pericardial window.  EXAM: PORTABLE CHEST - 1 VIEW  COMPARISON:  DG CHEST 1V PORT dated 07/21/2013  FINDINGS: Right IJ line in stable position. Stable cardiomegaly. No pulmonary venous  distention. Persistent mild left base subsegmental atelectasis remains. Mild elevation left hemidiaphragm. No prominent pleural effusion or pneumothorax noted. No acute osseous abnormality.  IMPRESSION: 1. Right IJ line in stable position. 2. Stable cardiomegaly. 3. Persistent left base subsegmental atelectasis.   Electronically Signed   By: Marcello Moores  Register   On: 07/22/2013 07:22   07/13/13  Ultrasound Abdomen FINDINGS:  Gallbladder: Surgically at  Common bile duct: Diameter: 7 mm  Liver: No focal abnormalities.  IMPRESSION:  No acute abnormalities   ENDOSCOPIC STUDIES: 10/2008  Colonoscopy Screening study ENDOSCOPIC IMPRESSION:  1) Two polyps in the cecum -removed  2) Severe diverticulosis in the left colon  3) Internal hemorrhoids  RECOMMENDATIONS:  1) Repeat colonoscopy in 5 years if polyp adenomatous; otherwise 10 years    IMPRESSION:   *  Bloody stools, from RN entries these were "black".   BUN went up in last several days.  Suspect UGI bleed in setting of Xarelto.  Lap band associated bleed unlikely per Dr Redmond Pulling.  PUD suspected.  + serum H Pylori and breath tech test, has yet to be treated. No PPI PTA. Now on PPI drip.   *  Constipation since lap band, acutely worse this admission.  If EGD is neg for source of blood loss, consider stercoral ulcer as source of bleed, this would not rise the BUN however.   *  ABL anemia.  2 units PRBCs ordered  *  Elevated LFTs, improved. Abdominal ultrasound unremarkable.  GB absent.  ? Element of shock liver/hypoperfusion due to pericardial effusion and A fib  *  Hx PE, Atrial fibrillation, currently in NSR.  On Xarelto, curently on hold.  Last dose was 530 PM yesterday.    *  4/17 pericardial window, drainage of pericardial effusion felt to be viral in origin  *  04/08/13 lap band and small HH repair (NOT A WRAP), steady weight loss since.  Has not had fluid infused into the lap band since initial placement. .   *        PLAN:      *  d/w Dr Henrene Pastor:  EGD, putting pt in depot *  If unable to traverse lap band, call Dr Redmond Pulling at [phone].    Vena Rua  07/22/2013, 11:30 AM Pager: [phone]  GI ATTENDING  History, laboratories, x-rays, prior endoscopy report reviewed. Patient personally seen and examined. Agree with extensive H&P as outlined above. Patient admitted with acute pericarditis. Problem complicated by pulmonary embolism. On Xarelto. Now with hemodynamically stable upper GI bleed. History of lap band surgery earlier this year. Now for upper endoscopy with possible hemostatic therapy. Last dose of Xarelto about 21 hours ago.The nature of the procedure, as well as the risks, benefits, and alternatives were carefully and thoroughly reviewed with the patient. Ample time for discussion and questions allowed. The patient understood, was satisfied, and agreed to proceed. Has a history of diminutive adenomas and diverticulosis. Due for surveillance colonoscopy in July. Also, question Helicobacter pylori positive without treatment.  Docia Chuck. Geri Seminole., M.D. [facility] Division of Gastroenterology

## 2013-07-22 NOTE — Op Note (Signed)
[facility] [place], [id number]   ENDOSCOPY PROCEDURE REPORT  PATIENT: [name], [name]  MR#: [id number] BIRTHDATE: 07/13/1945 , 33  yrs. old GENDER: Male ENDOSCOPIST: Eustace Quail, MD REFERRED BY:  [facility] PROCEDURE DATE:  07/22/2013 PROCEDURE:  EGD w/ biopsy ASA CLASS:     Class III INDICATIONS:  Melena. MEDICATIONS: Fentanyl 50 mcg IV and Versed 6 mg IV TOPICAL ANESTHETIC: Cetacaine Spray  DESCRIPTION OF PROCEDURE: After the risks benefits and alternatives of the procedure were thoroughly explained, informed consent was obtained.  The Pentax Gastroscope [id number] endoscope was introduced through the mouth and advanced to the second portion of the duodenum. Without limitations.  The instrument was slowly withdrawn as the mucosa was fully examined.    EXAM:The esophagus was normal.  The stomach revealed evidence of prior lap band surgery.  This was normally positioned.  The mid gastric body revealed a 1 cm linear ulcer which was friable but without significant stigmata.  No active bleeding.  The stomach was otherwise normal.  The duodenum was normal.  Retroflexed views revealed no abnormalities.     The scope was then withdrawn from the patient and the procedure completed.  COMPLICATIONS: There were no complications. ENDOSCOPIC IMPRESSION: 1. Superficial gastric ulcer as the cause for minor upper GI bleed. No active bleeding 2. Status post lap band surgery 3. History of H. pylori positivity. Question treated. Status post CLO biopsy today.  RECOMMENDATIONS: 1.  Continue PPI twice a day for 2 weeks then once daily indefinitely 2.  Rx CLO if positive 3. Okay to resume anticoagulation from a GI standpoint. Monitor hemoglobin and stools.   REPEAT EXAM:  eSigned:  Eustace Quail, MD 07/22/2013 2:53 PM   [id number] Chanda Busing, MD, Greer Pickerel MD, Lanelle Bal, MD, and The Patient

## 2013-07-22 NOTE — Progress Notes (Signed)
[facility] PROGRESS NOTE  BUTLER VEGH WCB:[id number] DOB: 1945-09-09 DOA: 07/16/2013 PCP: [name], MD Interim summary: [name] is an 68 y.o. male with hx of bariatric surgery one year ago, lost about 27 lbs total before and after surgery, hx of HTN, obesity and sleep apnea, gout, depression, BPH, noted that he had a severe "viral illness" about 3 months ago, followed by a persistent but no productive cough, wheezing and shortness of breath. He was seen at the urgent care, Dx with URI, and given symptomatic Tx. He saw his PCP, who was concerned about a PE, and thus started him on Xarelto and Lasix, and he presented to the ER today with same symptoms. He said his chest pain was with movement, better with lying down and with palpation. He has no orthopnea or PND. He denied fever, chills, nausea, vomiting or diaphoresis. Evaluation in the ER showed EKG with no ST elevation, in NSR, with S1Q3 and reverse T wave in the precordial leads. A CTPA was done, and it showed a small clot burden acute pulmonary edema, with pericardial effusion along with bilateral pleural effusion. He denied rash, joint pain, family hx of rhematoid arthritis, or lupus. [facility] to [facility] him for PE and pericarditis. Echocardiogram was done, showed moderate to large pericardial effusion, then cardiology was consulted and he was put on IV lasix. Meanwhile the night of 4/16 he went in to afib with RVR, probably secondary to PE and was transferred to step down and started on cardizem drip. He converted to sinus the same night , cardizem discontinued and he was resumed on metoprolol. CT surgery was consulted for pericardial window. He underwent pericardial window on 4/17 and the hemorrhagic fluid was sent for analysis. He was also started on colchicine for his pericarditis.  So far his gram stain and cultures have been negative. Drainage from the chest tube minimal at this time and his chest tube was  discontinued on 4/20. The evening of 4/20 pt went back in to afib with RVR and had bloody bowel movement. His xarelto was discontinued and he was put on cardizem gtt. Over the next 24 hours his hemoglobin dropped to 7.8 and he received 2 units of prbc transfusion. His repeat H&H is pending. Dr Henrene Pastor was consulted from GI on 4/20 and he underwent endoscopy on 4/21 , . He was found to have superficial ulcers in the stomach, underwent biopsy of the area. He was recommended to continue on PPI twice daily for 2 weeks and resume xarelto. Dr Redmond Pulling from surgery was also consulted for evaluation of the lap band surgery.   Assessment/Plan: 1. Small subsegmental PE: - patient was initially started on xarelto, but was stopped 4/20 after an episode of bloody BM. His last xarelto dose was on 4/20.  - lower extremity dopplers and negative for DVT. Unclear etiology. His hypercoagulable work up negative so far.  - he is cleared from GI to resume xarelto,. Please start xarelto on 4/22.   2. Moderate pericardial effusion/ extensive pericarditis.:  - unclear etiology, idiopathic.  - echo done showed moderate pericardial effusion. No evidence of hemodynamic compromise.  - CT surgery consulted for pericardial window.  He underwent pericardial window , and was found to have severe hemorraghic pericarditis. Fluid sent for analysis including cytology, microbiology and culture. So far infectious etiology is negative and path is negative for malignancy, showed reactive mesothelial cells.   - started pt on colchicine and pain medications. His toradol was stopped  after his episode of bloody BM.  - CT surgery is on board.   3. Leukocytosis:  - he remains afebrile and his max temp is 99.1. His leukocytosis worsened over the last 48 hours. Pro calcitonin levels ordered.  - unclear etiology, probably reactive. .   4.  Recurrent Atrial fibrillation with RVR:  - he initially went into Afib with RVR on the evening of 4/16, he was  started on cardizem gtt, later transitioned to home metoprolol when he converted to sinus.    5. Elevated liver function tests: - denies any abdominal pain.  Unclear etiology. Continue to monitor.   - US abdomen negative . Liver function panel shows improvement.   6. Obstructive sleep apnea: - on CPAP at home.   7. Gout: - stable.   8. Hypertension: controlled.  9. Bloody and black stools. : - he initially complained of constipation and was given dulcolax suppository the night of 4/19. Since then pt reported 8 loose BM'S/  - on 4/20 pt reported one blood bowel movement , his BUN elevated 67 with baseline creatinine.  - his xarelto was held and he underwent EGD , showing asuperficial ulcer with no active bleed.  - recommend PPI twice a day.   DVT prophylaxis.   Code Status: full code Family Communication: wife at bedside Disposition Plan: pending.    Consultants:  Cardiology Dr Debara Pickett.  CT surgery   Gastroenterology Dr Henrene Pastor.   Procedures:  Echocardiogram  Pericardial window on 4/17  Antibiotics:  none  HPI/Subjective: Is alert awake and comfortable. Cough has improved.   Objective: Filed Vitals:   07/22/13 0800  BP: 111/66  Pulse: 83  Temp:   Resp: 13    Intake/Output Summary (Last 24 hours) at 07/22/13 0829 Last data filed at 07/22/13 0600  Gross per 24 hour  Intake 801.58 ml  Output   1510 ml  Net -708.42 ml   Filed Weights   07/17/13 2031 07/20/13 0425 07/22/13 0530  Weight: 138.2 kg (304 lb 10.8 oz) 139.6 kg (307 lb 12.2 oz) 133.7 kg (294 lb 12.1 oz)    Exam:   General:  Alert afebrile comfortable  Cardiovascular: s1s2  Respiratory: ctab  Abdomen: soft NT ND BS+  Musculoskeletal: trace pedal edema  Data Reviewed: Basic Metabolic Panel:  Recent Labs Lab 07/18/13 0405 07/19/13 0420 07/20/13 0420 07/21/13 0457 07/22/13 0531  NA 141 139 139 144 145  K 3.2* 4.8 4.1 3.8 3.7  CL 103 104 105 104 108  CO2 23 23 23 24 24    GLUCOSE 112* 175* 103* 105* 134*  BUN 38* 36* 42* 67* 59*  CREATININE 1.20 1.08 1.03 1.01 1.01  CALCIUM 8.7 8.3* 8.2* 8.3* 8.1*   Liver Function Tests:  Recent Labs Lab 07/18/13 1800 07/19/13 0420 07/20/13 0420 07/21/13 0457 07/22/13 0531  AST 139* 106* 77* 49* 41*  ALT 157* 139* 118* 97* 70*  ALKPHOS 461* 413* 349* 359* 221*  BILITOT 1.0 0.9 0.6 0.6 0.4  PROT 6.5 6.3 5.9* 6.3 5.4*  ALBUMIN 2.5* 2.4* 2.3* 2.5* 2.3*   No results found for this basename: LIPASE, AMYLASE,  in the last 168 hours No results found for this basename: AMMONIA,  in the last 168 hours CBC:  Recent Labs Lab 07/19/13 0420 07/20/13 0420 07/21/13 0457 07/21/13 1800 07/21/13 2210 07/22/13 0531  WBC 13.8* 14.0* 17.6* 20.1*  --  21.5*  HGB 10.7* 10.4* 10.6* 9.1* 8.3* 7.8*  HCT 32.4* 32.3* 32.5* 27.7* 25.5* 23.5*  MCV 91.5  92.0 91.8 91.7  --  90.4  PLT 323 304 430* 420*  --  437*   Cardiac Enzymes:  Recent Labs Lab 07/15/13 0854 07/17/13 2000 07/18/13 0038 07/18/13 0727  TROPONINI 0.01 <0.30 <0.30 <0.30   BNP (last 3 results)  Recent Labs  07/13/13 1835  PROBNP 519.5*   CBG: No results found for this basename: GLUCAP,  in the last 168 hours  Recent Results (from the past 240 hour(s))  MRSA PCR SCREENING     Status: None   Collection Time    07/18/13  4:03 AM      Result Value Ref Range Status   MRSA by PCR NEGATIVE  NEGATIVE Final   Comment:            The GeneXpert MRSA Assay (FDA     approved for NASAL specimens     only), is one component of a     comprehensive MRSA colonization     surveillance program. It is not     intended to diagnose MRSA     infection nor to guide or     monitor treatment for     MRSA infections.  BODY FLUID CULTURE     Status: None   Collection Time    07/18/13 12:43 PM      Result Value Ref Range Status   Specimen Description FLUID PERICARDIAL   Final   Special Requests PT ON ZINACEF 1.5    Final   Gram Stain     Final   Value: RARE WBC  PRESENT,BOTH PMN AND MONONUCLEAR     NO ORGANISMS SEEN     TECH REVIEW     Performed at [facility]   Culture     Final   Value: NO GROWTH 3 DAYS     Performed at [facility]   Report Status PENDING   Incomplete  FUNGUS CULTURE W SMEAR     Status: None   Collection Time    07/18/13 12:43 PM      Result Value Ref Range Status   Specimen Description FLUID PERICARDIAL   Final   Special Requests PT ON ZINACEF 1.5   Final   Fungal Smear     Final   Value: NO YEAST OR FUNGAL ELEMENTS SEEN     Performed at [facility]   Culture     Final   Value: CULTURE IN PROGRESS FOR FOUR WEEKS     Performed at [facility]   Report Status PENDING   Incomplete  ANAEROBIC CULTURE     Status: None   Collection Time    07/18/13 12:43 PM      Result Value Ref Range Status   Specimen Description FLUID PERICARDIAL   Final   Special Requests PT ON ZINACEF 1.5   Final   Gram Stain PENDING   Incomplete   Culture     Final   Value: NO ANAEROBES ISOLATED; CULTURE IN PROGRESS FOR 5 DAYS     Performed at [facility]   Report Status PENDING   Incomplete  GRAM STAIN     Status: None   Collection Time    07/18/13 12:43 PM      Result Value Ref Range Status   Specimen Description FLUID PERICARDIAL   Final   Special Requests PT ON ZINACEF 1.5   Final   Gram Stain     Final   Value: RARE WBC PRESENT,BOTH PMN AND MONONUCLEAR  NO ORGANISMS SEEN   Report Status 07/18/2013 FINAL   Final  BODY FLUID CULTURE     Status: None   Collection Time    07/18/13 12:43 PM      Result Value Ref Range Status   Specimen Description FLUID PERICARDIAL   Final   Special Requests FLUID ON SWAB PATIEN ON FOLLOWING ZINACEF 1.5   Final   Gram Stain     Final   Value: RARE WBC PRESENT,BOTH PMN AND MONONUCLEAR     NO ORGANISMS SEEN     Performed at [facility]   Culture     Final   Value: NO GROWTH 3 DAYS     Performed at [facility]   Report Status 07/21/2013  FINAL   Final  FUNGUS CULTURE W SMEAR     Status: None   Collection Time    07/18/13 12:43 PM      Result Value Ref Range Status   Specimen Description FLUID PERICARDIAL   Final   Special Requests PT ON ZINACEF 1.5 FLUID ON SWAB   Final   Fungal Smear     Final   Value: NO YEAST OR FUNGAL ELEMENTS SEEN     Performed at [facility]   Culture     Final   Value: CULTURE IN PROGRESS FOR FOUR WEEKS     Performed at [facility]   Report Status PENDING   Incomplete  AFB CULTURE WITH SMEAR     Status: None   Collection Time    07/18/13 12:43 PM      Result Value Ref Range Status   Specimen Description FLUID PERICARDIAL   Final   Special Requests PT ON ZINACEF 1.5 FLUID ON SWAB   Final   ACID FAST SMEAR     Final   Value: NO ACID FAST BACILLI SEEN     Performed at [facility]   Culture     Final   Value: CULTURE WILL BE EXAMINED FOR 6 WEEKS BEFORE ISSUING A FINAL REPORT     Performed at [facility]   Report Status PENDING   Incomplete  ANAEROBIC CULTURE     Status: None   Collection Time    07/18/13 12:43 PM      Result Value Ref Range Status   Specimen Description FLUID PERICARDIAL   Final   Special Requests FLUID ON SWAB   Final   Gram Stain     Final   Value: RARE WBC PRESENT,BOTH PMN AND MONONUCLEAR     NO ORGANISMS SEEN     Performed at [facility]   Culture     Final   Value: NO ANAEROBES ISOLATED; CULTURE IN PROGRESS FOR 5 DAYS     Performed at [facility]   Report Status PENDING   Incomplete  VIRAL CULTURE VIRC     Status: None   Collection Time    07/18/13 12:43 PM      Result Value Ref Range Status   Specimen Description FLUID PERICARDIAL   Final   Special Requests NONE   Final   Culture     Final   Value: Culture has been initiated.     Note:    The current Enterovirus culture system does not detect Enterovirus D68. If Enterovirus D68 is suspected, please call client services to add the test for Enterovirus PCR  (Test code [id number]).     Performed at [facility]  Report Status PENDING   Incomplete     Studies: Dg Chest Port 1 View  07/22/2013   CLINICAL DATA:  Pericardial effusion.  Pericardial window.  EXAM: PORTABLE CHEST - 1 VIEW  COMPARISON:  DG CHEST 1V PORT dated 07/21/2013  FINDINGS: Right IJ line in stable position. Stable cardiomegaly. No pulmonary venous distention. Persistent mild left base subsegmental atelectasis remains. Mild elevation left hemidiaphragm. No prominent pleural effusion or pneumothorax noted. No acute osseous abnormality.  IMPRESSION: 1. Right IJ line in stable position. 2. Stable cardiomegaly. 3. Persistent left base subsegmental atelectasis.   Electronically Signed   By: Marcello Moores  Register   On: 07/22/2013 07:22   Dg Chest Port 1 View  07/21/2013   CLINICAL DATA:  Pericardial window.  EXAM: PORTABLE CHEST - 1 VIEW  COMPARISON:  DG CHEST PORT 1VSAME DAY dated 07/20/2013; CT ANGIO CHEST W/CM &/OR WO/CM dated 07/16/2013  FINDINGS: Mediastinum hilar structures are normal. Right IJ line in stable position. Stable cardiomegaly and pulmonary vascularity. Mild left base subsegmental atelectasis. No pneumothorax.  IMPRESSION: 1. Right IJ line in stable position. 2. Stable cardiomegaly, no CHF. 3. Mild left lung base atelectasis   Electronically Signed   By: Marcello Moores  Register   On: 07/21/2013 10:30    Scheduled Meds: . calcium carbonate  250 mg Oral TID WC  . colchicine  0.6 mg Oral BID  . finasteride  5 mg Oral Daily  . furosemide  20 mg Oral Daily  . gabapentin  100 mg Oral BID  . LORazepam  0.5 mg Oral Daily  . LORazepam  1 mg Oral QHS  . metoprolol tartrate  25 mg Oral BID  . multivitamins with iron  1 tablet Oral Daily  . pantoprazole (PROTONIX) IV  80 mg Intravenous Q24H  . risperiDONE  0.5 mg Oral QHS  . sertraline  150 mg Oral Q breakfast  . sodium chloride  10-40 mL Intravenous Q12H  . sodium chloride  3 mL Intravenous Q12H  . vitamin B-12  1,000 mcg Oral Daily    Continuous Infusions: . sodium chloride 250 mL (07/21/13 2025)  . diltiazem (CARDIZEM) infusion Stopped (07/22/13 0530)    Principal Problem:   CHF (congestive heart failure) Active Problems:   HYPERLIPIDEMIA   HTN (hypertension)   SLEEP APNEA   Morbid obesity   Morbid obesity with BMI of 45.0-49.9, adult   H/O laparoscopic adjustable gastric banding & hiatal hernia repair 04/08/13   Pericardial effusion   Pericarditis   Post viral syndrome   Pulmonary embolism   Hepatitis   Atrial fibrillation   Pulmonary embolus    Time spent: 35 min    Hosie Poisson  [facility] Pager [phone]  If 7PM-7AM, please contact night-coverage at [email or web address], password [facility] 07/22/2013, 8:29 AM  LOS: 6 days

## 2013-07-22 NOTE — Progress Notes (Signed)
SOCIAL VISIT  Informed by our Bariatric Nurse Coordinator that pt had been admitted several days ago.  Pt resting comfortably. Denies reflux/regurg/po intolerance.   Pt had LapBand (laparoscopic adjustable gastric band) surgery along with hiatal hernia repair (plication of L & R crus) in jan 2015. Initial visit weight in 2014 was 361 pounds. Preop weight was 343.   He has not had an adjustment to his band since surgery. He has been in the green zone since surgery. (there is a small amount of fluid in the band that is placed at time of surgery)  -Anemia/LFTs would be unusual from LapBand surgery.  -He is ok for EGD from my point of view. Should be able to traverse GE junction although angle may be a little difft given hiatal hernia repair.  -CT PE reviewed. LapBand is in normal position. No evidence of band slip -We can drain the band if needed -Please call with questions.  Leighton Ruff. Redmond Pulling, MD, FACS General, Bariatric, & Minimally Invasive Surgery [facility], [place]

## 2013-07-22 NOTE — Progress Notes (Addendum)
      [address]       [place] [address]             [phone]       4 Days Post-Op Procedure(s) (LRB): SUBXYPHOID PERICARDIAL WINDOW (N/A) INTRAOPERATIVE TRANSESOPHAGEAL ECHOCARDIOGRAM (N/A)  Subjective: Patient states CPAP is uncomfortable. On 2 liters [place] this am.  Objective: Vital signs in last 24 hours: Temp:  [98.2 F (36.8 C)-99.2 F (37.3 C)] 98.7 F (37.1 C) (04/21 0730) Pulse Rate:  [38-155] 88 (04/21 1000) Cardiac Rhythm:  [-] Normal sinus rhythm (04/21 0800) Resp:  [12-30] 17 (04/21 1000) BP: (73-135)/(45-118) 106/56 mmHg (04/21 0900) SpO2:  [93 %-100 %] 100 % (04/21 1000) Weight:  [294 lb 12.1 oz (133.7 kg)] 294 lb 12.1 oz (133.7 kg) (04/21 0530)     Intake/Output from previous day: 04/20 0701 - 04/21 0700 In: 1171.6 [P.O.:900; I.V.:171.6; IV Piggyback:100] Out: 1510 [Urine:1450; Chest Tube:60]   Physical Exam:  Cardiovascular: RRR Pulmonary: Clear to auscultation on right and slightly diminished on left; no rales, wheezes, or rhonchi. Wound: Clean and dry.  No erythema or signs of infection.   Lab Results: CBC: Recent Labs  07/21/13 1800 07/21/13 2210 07/22/13 0531  WBC 20.1*  --  21.5*  HGB 9.1* 8.3* 7.8*  HCT 27.7* 25.5* 23.5*  PLT 420*  --  437*   BMET:  Recent Labs  07/21/13 0457 07/22/13 0531  NA 144 145  K 3.8 3.7  CL 104 108  CO2 24 24  GLUCOSE 105* 134*  BUN 67* 59*  CREATININE 1.01 1.01  CALCIUM 8.3* 8.1*    PT/INR: No results found for this basename: LABPROT, INR,  in the last 72 hours ABG:  INR: Will add last result for INR, ABG once components are confirmed Will add last 4 CBG results once components are confirmed  Assessment/Plan:  1. CV - A fib with RVR yesterday.  Given Diltiazem drip and Digoxin. Converted to SR earlier this am. BP somewhat labile.Per cardiology 2.  Pulmonary - CXR shows no pneumothorax, stable cardiomegaly, and left base atelectasis, and mild elevated hemidiaphragm.  Encourage incentive spirometer. Cultures negative for malignancy.  ? Previous pericarditis as had viral infection several months ago. On colchicine. Small, subsegmental PE. He was started on Xarelto but was stopped after bloody like stools yesterday. 3. Anemia-H and H 7.8 and 23.5. Occult blood positive. Per medicine. GI evaluating.  Donielle M ZimmermanPA-C 07/22/2013,10:43 AM Holding sinus now, feels much better then yesterday Getting PRBC's now I have seen and examined [name] and agree with the above assessment  and plan.  Grace Isaac MD Beeper [phone] Office [phone] 07/22/2013 5:42 PM

## 2013-07-22 NOTE — Progress Notes (Signed)
Pt. unable to tolerate CPAP mask. He took it off. Patient placed on 2 liters [place].

## 2013-07-22 NOTE — Progress Notes (Signed)
Patient ID: [name], male   DOB: 02/13/1946, 68 y.o.   MRN: [id number]     SUBJECTIVE: Patient had episode of AFIB RVR yesterday and required dilt gtt and dig IV. BP soft. He converted. No longer on dilt or dig. Low dose metoprolol.   Patient remains in NSR.  Chest tube removed.  No complaints. Had many bowel movements overnight. Heme +. Getting PRBC 2 units. Usually has issues with constipation.    Scheduled Meds: . calcium carbonate  250 mg Oral TID WC  . colchicine  0.6 mg Oral BID  . finasteride  5 mg Oral Daily  . furosemide  20 mg Oral Daily  . gabapentin  100 mg Oral BID  . LORazepam  0.5 mg Oral Daily  . LORazepam  1 mg Oral QHS  . metoprolol tartrate  25 mg Oral BID  . multivitamins with iron  1 tablet Oral Daily  . pantoprazole (PROTONIX) IV  80 mg Intravenous Q24H  . risperiDONE  0.5 mg Oral QHS  . sertraline  150 mg Oral Q breakfast  . sodium chloride  10-40 mL Intravenous Q12H  . sodium chloride  3 mL Intravenous Q12H  . vitamin B-12  1,000 mcg Oral Daily   Continuous Infusions: . sodium chloride 10 mL/hr at 07/22/13 1100  . sodium chloride    . diltiazem (CARDIZEM) infusion Stopped (07/22/13 0530)   PRN Meds:.fentaNYL, HYDROmorphone (DILAUDID) injection, ondansetron (ZOFRAN) IV, ondansetron, sodium chloride, sodium chloride    Filed Vitals:   07/22/13 1000 07/22/13 1100 07/22/13 1102 07/22/13 1145  BP:   98/57 98/57  Pulse: 88 73 64   Temp:   98.2 F (36.8 C) 98.4 F (36.9 C)  TempSrc:   Oral Oral  Resp: 17 16    Height:      Weight:      SpO2: 100% 100% 100%     Intake/Output Summary (Last 24 hours) at 07/22/13 1155 Last data filed at 07/22/13 1100  Gross per 24 hour  Intake 851.58 ml  Output   1510 ml  Net -658.42 ml    LABS: Basic Metabolic Panel:  Recent Labs  07/21/13 0457 07/22/13 0531  NA 144 145  K 3.8 3.7  CL 104 108  CO2 24 24  GLUCOSE 105* 134*  BUN 67* 59*  CREATININE 1.01 1.01  CALCIUM 8.3* 8.1*   Liver  Function Tests:  Recent Labs  07/21/13 0457 07/22/13 0531  AST 49* 41*  ALT 97* 70*  ALKPHOS 359* 221*  BILITOT 0.6 0.4  PROT 6.3 5.4*  ALBUMIN 2.5* 2.3*    CBC:  Recent Labs  07/21/13 1800 07/21/13 2210 07/22/13 0531  WBC 20.1*  --  21.5*  HGB 9.1* 8.3* 7.8*  HCT 27.7* 25.5* 23.5*  MCV 91.7  --  90.4  PLT 420*  --  437*   RADIOLOGY: Dg Chest 2 View  07/15/2013   CLINICAL DATA:  Dyspnea and chest pain.  EXAM: CHEST  2 VIEW  COMPARISON:  07/13/2013 and 09/26/2012  FINDINGS: Lungs are adequately inflated with continued evidence of a small left pleural effusion with possible slight interval improvement. There is mild stable cardiomegaly. Remainder the exam is unchanged.  IMPRESSION: Continued small left pleural effusion with possible slight interval improvement.   Electronically Signed   By: Marin Olp M.D.   On: 07/15/2013 11:32   Dg Chest 2 View  07/13/2013   CLINICAL DATA:  Weakness and chest pain.  EXAM: CHEST - 2 VIEW  COMPARISON:  DG CHEST 2 VIEW dated 09/26/2012; DG THORACIC SPINE dated 01/19/2011  FINDINGS: The heart is mildly enlarged. Mild interstitial and pulmonary vascular prominence identified without evidence of overt edema. There is a small left pleural effusion. Bony structures show stable mild degenerative disease of the thoracic spine.  IMPRESSION: Cardiomegaly and pulmonary interstitial prominence with small left pleural effusion. No overt edema is identified   Electronically Signed   By: Aletta Edouard M.D.   On: 07/13/2013 18:16   Ct Angio Chest Pe W/cm &/or Wo Cm  07/16/2013   CLINICAL DATA:  Shortness of breath, neck/back pain, elevated D-dimer, evaluate for PE. Lap band surgery in January.  EXAM: CT ANGIOGRAPHY CHEST WITH CONTRAST  TECHNIQUE: Multidetector CT imaging of the chest was performed using the standard protocol during bolus administration of intravenous contrast. Multiplanar CT image reconstructions and MIPs were obtained to evaluate the vascular  anatomy.  CONTRAST:  6mL OMNIPAQUE IOHEXOL 350 MG/ML SOLN  COMPARISON:  None.  FINDINGS: Tiny subsegmental filling defects within branches of the right lower lobe pulmonary artery (series 5/ images 169, 170, and 189), suspicious for pulmonary emboli. Overall clot burden is small. No findings to suggest right heart strain (RV-to-LV ratio 0.87).  Small to moderate left and small right pleural effusions. No frank interstitial edema. No pneumothorax.  Visualized right thyroid is notable for a suspected 2.2 cm thyroid nodule (series 4/ image 1), incompletely visualized.  The heart is top-normal in size. Small to moderate pericardial effusion with a mildly irregular appearance. Mild atherosclerotic calcifications of the aortic arch.  No suspicious mediastinal, hilar, or axillary lymphadenopathy.  Visualized upper abdomen is notable for a laparoscopic band in satisfactory position.  Degenerative changes of the visualized thoracolumbar spine.  Review of the MIP images confirms the above findings.  IMPRESSION: Suspected tiny subsegmental pulmonary emboli within branches of the right lower lobe pulmonary artery. Overall clot burden is small.  Small to moderate left and small right pleural effusions. No frank interstitial edema.  Small to moderate pericardial effusion. Given the irregular appearance, correlate for pericarditis.  Critical Value/emergent results were called by telephone at the time of interpretation on 07/16/2013 at 5:28 PM to Dr. Gwyndolyn Saxon HOPPER, who verbally acknowledged these results.   Electronically Signed   By: Julian Hy M.D.   On: 07/16/2013 17:29   US Abdomen Limited  07/13/2013   CLINICAL DATA:  Increased liver function tests, right upper quadrant pain  EXAM: US ABDOMEN LIMITED - RIGHT UPPER QUADRANT  COMPARISON:  None.  FINDINGS: Gallbladder:  Surgically at  Common bile duct:  Diameter: 7 mm  Liver:  No focal abnormalities.  IMPRESSION: No acute abnormalities   Electronically Signed   By:  Skipper Cliche M.D.   On: 07/13/2013 20:37   Dg Chest Port 1 View  07/18/2013   CLINICAL DATA:  Cough.  Status post pericardial window procedure.  EXAM: PORTABLE CHEST - 1 VIEW  COMPARISON:  CT ANGIO CHEST W/CM &/OR WO/CM dated 07/16/2013; DG CHEST 2 VIEW dated 07/15/2013  FINDINGS: Right IJ line tip: Upper SVC. Moderately enlarged cardiopericardial silhouette. Indistinct pulmonary vasculature compatible with pulmonary venous hypertension.  Right costophrenic angle excluded. No pneumothorax or significant pneumomediastinum observed. Retrocardiac density suggests mild atelectasis in the left lower lobe.  IMPRESSION: 1. Continued enlargement of the cardiopericardial silhouette, with underlying pulmonary venous hypertension. 2. Mild atelectasis in the left lower lobe. 3. Right IJ line tip:  Upper SVC.   Electronically Signed   By: Sherryl Barters  M.D.   On: 07/18/2013 15:01    PHYSICAL EXAM General: NAD Neck: Thick, JVP ?7-8, no thyromegaly or thyroid nodule.  Lungs: Clear to auscultation bilaterally with normal respiratory effort. CV: Nondisplaced PMI.  Heart regular S1/S2, no S3/S4, no murmur.  Trace ankle edema.Pericardial drain removed. Abdomen: Soft, nontender, no hepatosplenomegaly, no distention.  Neurologic: Alert and oriented x 3.  Psych: Normal affect. Extremities: No clubbing or cyanosis.   TELEMETRY: Reviewed telemetry pt in NSR  ASSESSMENT AND PLAN: 68 yo with history of HTN s/p lap band in 1/15 was admitted with atrial fibrillation/RVR symptomatic and the finding of a pulmonary embolus (small burden).    CTA that found PE also showed a moderate to large pericardial effusion.  Echo showed no definite tamponade, but patient had pericardial window 4/17 for diagnostic and therapeutic purposes.   Now with decreased Hg and GIB.   1. Atrial fibrillation: Paroxsymal and symptomatic.This may be secondary to PE or pericardial effusion or general inflammatory process (WBC 21).  Now back in  sinus bradycardia.   - Stopped Xarelto 4/21, GIB. - Continue low dose metoprolol.  - Address AFIB with RVR if returns. OK with dilt IV as tolerated.   2. Pericardial effusion: Bloody effusion. Etiology is uncertain. Pericardial effusion initially noted on CTA when PE was detected.  Possible prior pericarditis (severe presumed viral infection several months ago), ?worsened by anticoagulation for PE.  Pericardial window 4/18 for diagnosis and treatment.  Gram stain negative, pathology negative. He is on colchicine, continue x 3 months.   -Pericardial drain DC 4/20. Per TCTS team. Note reviewed.   -  DC Right IJ when stable.   3. Pulmonary embolus: Again, etiology uncertain.  Had surgery but back in 1/15.  So far hypercoagulable workup negative.  Now off of Xarelto because of GIB. Thankfully, small clot burden. No DVT on LE Dopplers 4/16.   4. CHF: Acute diastolic CHF.  CVP 4 last check.  Lasix to po 20mg . OK with PRBC 2 units.   5. GIB. GI on board. BUN increased. Increased BM. Heme +, Hg <8. Getting PRBC. Endoscopy.    Elta Guadeloupe [facility] 07/22/2013 11:55 AM

## 2013-07-23 ENCOUNTER — Encounter (HOSPITAL_COMMUNITY): Payer: Self-pay | Admitting: Internal Medicine

## 2013-07-23 LAB — TYPE AND SCREEN
ABO/RH(D): O POS
Antibody Screen: NEGATIVE
UNIT DIVISION: 0
Unit division: 0

## 2013-07-23 LAB — HEMOGLOBIN AND HEMATOCRIT, BLOOD
HCT: 24.5 % — ABNORMAL LOW (ref 39.0–52.0)
HCT: 23.6 % — ABNORMAL LOW (ref 39.0–52.0)
HEMOGLOBIN: 7.8 g/dL — AB (ref 13.0–17.0)
Hemoglobin: 8 g/dL — ABNORMAL LOW (ref 13.0–17.0)

## 2013-07-23 LAB — ANAEROBIC CULTURE

## 2013-07-23 LAB — PROCALCITONIN: Procalcitonin: <0.1 ng/mL

## 2013-07-23 MED ORDER — RIVAROXABAN 15 MG PO TABS
15.0000 mg | ORAL_TABLET | Freq: Once | ORAL | Status: DC
  Filled 2013-07-23: qty 1

## 2013-07-23 MED ORDER — RIVAROXABAN 20 MG PO TABS: 20.0000 mg | ORAL_TABLET | Freq: Every day | ORAL | Status: DC

## 2013-07-23 MED ORDER — DOCUSATE SODIUM 100 MG PO CAPS
100.0000 mg | ORAL_CAPSULE | Freq: Two times a day (BID) | ORAL | Status: DC
  Administered 2013-07-23 – 2013-07-25 (×5): 100 mg via ORAL
  Filled 2013-07-23 (×6): qty 1

## 2013-07-23 MED ORDER — POLYETHYLENE GLYCOL 3350 17 G PO PACK
17.0000 g | PACK | Freq: Every day | ORAL | Status: DC
  Administered 2013-07-23 – 2013-07-25 (×2): 17 g via ORAL
  Filled 2013-07-23 (×3): qty 1

## 2013-07-23 MED ORDER — PANTOPRAZOLE SODIUM 40 MG PO TBEC
40.0000 mg | DELAYED_RELEASE_TABLET | Freq: Two times a day (BID) | ORAL | Status: DC
  Administered 2013-07-23 – 2013-07-25 (×5): 40 mg via ORAL
  Filled 2013-07-23 (×6): qty 1

## 2013-07-23 MED ORDER — RIVAROXABAN 15 MG PO TABS
15.0000 mg | ORAL_TABLET | Freq: Two times a day (BID) | ORAL | Status: DC
Start: 2013-07-23 — End: 2013-07-23
  Filled 2013-07-23 (×2): qty 1

## 2013-07-23 MED ORDER — RIVAROXABAN 20 MG PO TABS
20.0000 mg | ORAL_TABLET | Freq: Every day | ORAL | Status: DC
Start: 2013-07-23 — End: 2013-07-25
  Administered 2013-07-23 – 2013-07-24 (×2): 20 mg via ORAL
  Filled 2013-07-23 (×3): qty 1

## 2013-07-23 NOTE — Progress Notes (Signed)
[facility] PROGRESS NOTE  [name] ZOX:[id number] DOB: 06/09/1945 DOA: 07/16/2013 PCP: Marga Melnick, MD Interim summary: [name] is an 68 y.o. male with hx of bariatric surgery one year ago, lost about 85 lbs total before and after surgery, hx of HTN, obesity and sleep apnea, gout, depression, BPH, noted that he had a severe "viral illness" about 3 months ago, followed by a persistent but no productive cough, wheezing and shortness of breath. He was seen at the urgent care, Dx with URI, and given symptomatic Tx. He saw his PCP, who was concerned about a PE, and thus started him on Xarelto and Lasix, and he presented to the ER today with same symptoms. He said his chest pain was with movement, better with lying down and with palpation. He has no orthopnea or PND. He denied fever, chills, nausea, vomiting or diaphoresis. Evaluation in the ER showed EKG with no ST elevation, in NSR, with S1Q3 and reverse T wave in the precordial leads. A CTPA was done, and it showed a small clot burden acute pulmonary edema, with pericardial effusion along with bilateral pleural effusion. He denied rash, joint pain, family hx of rhematoid arthritis, or lupus. [facility] was asked to admit him for PE and pericarditis. Echocardiogram was done, showed moderate to large pericardial effusion, then cardiology was consulted and he was put on IV lasix. Meanwhile the night of 4/16 he went in to afib with RVR, probably secondary to PE and was transferred to step down and started on cardizem drip. He converted to sinus the same night , cardizem discontinued and he was resumed on metoprolol. CT surgery was consulted for pericardial window. He underwent pericardial window on 4/17 and the hemorrhagic fluid was sent for analysis. He was also started on colchicine for his pericarditis.  So far his gram stain and cultures have been negative. Drainage from the chest tube minimal at this time and his chest tube was  discontinued on 4/20. The evening of 4/20 pt went back in to afib with RVR and had bloody bowel movement. His xarelto was discontinued and he was put on cardizem gtt. Over the next 24 hours his hemoglobin dropped to 7.8 and he received 2 units of prbc transfusion. His repeat H&H is pending. Dr Marina Goodell was consulted from GI on 4/20 and he underwent endoscopy on 4/21 , . He was found to have superficial ulcers in the stomach, underwent biopsy of the area. He was recommended to continue on PPI twice daily for 2 weeks and resume xarelto. Dr Andrey Campanile from surgery was also consulted for evaluation of the lap band surgery.   Assessment/Plan: 1. Small subsegmental PE: - patient was initially started on xarelto, but was stopped 4/20 after an episode of bloody BM. His last xarelto dose was on 4/20.  - lower extremity dopplers and negative for DVT. Unclear etiology. His hypercoagulable work up negative so far.  - Xarelto restarted on 07/23/13 -Close monitoring for bleed  2. Moderate pericardial effusion/ extensive pericarditis.:  - unclear etiology, idiopathic.  - echo done showed moderate pericardial effusion. No evidence of hemodynamic compromise.  - CT surgery consulted for pericardial window.  He underwent pericardial window , and was found to have severe hemorraghic pericarditis. Fluid sent for analysis including cytology, microbiology and culture. So far infectious etiology is negative and path is negative for malignancy, showed reactive mesothelial cells.   - started pt on colchicine and pain medications. His toradol was stopped after his episode of bloody BM.  3. Leukocytosis:  - he remains afebrile and his max temp is 99.1. His leukocytosis worsened over the last 48 hours. Pro calcitonin levels ordered.  - unclear etiology, probably reactive, patient is nontoxic, afebrile  4.  Recurrent Atrial fibrillation with RVR:  - he initially went into Afib with RVR on the evening of 4/16, he was started on  cardizem gtt, later transitioned to home metoprolol when he converted to sinus.    5. Elevated liver function tests: - denies any abdominal pain.  Unclear etiology. Continue to monitor.   - US abdomen negative . Liver function panel shows improvement.   6. Obstructive sleep apnea: - on CPAP at home.   7. Gout: - stable.   8. Hypertension: controlled.  9. Bloody and black stools. : - he initially complained of constipation and was given dulcolax suppository the night of 4/19. Since then pt reported 8 loose BM'S/  - on 4/20 pt reported one blood bowel movement , his BUN elevated 67 with baseline creatinine.  - his xarelto was held and he underwent EGD , showing asuperficial ulcer with no active bleed.  - Changed to Protonix BID.   DVT prophylaxis.   Code Status: full code Family Communication: wife at bedside Disposition Plan: pending.    Consultants:  Cardiology Dr Debara Pickett.  CT surgery   Gastroenterology Dr Henrene Pastor.   Procedures:  Echocardiogram  Pericardial window on 4/17  Antibiotics:  none  HPI/SubjectivePatient is awake and alert, thinks he is feeling better. Denies bloody bowel movemen0st.   Objective: Filed Vitals:   07/23/13 1203  BP:   Pulse:   Temp: 98.7 F (37.1 C)  Resp:     Intake/Output Summary (Last 24 hours) at 07/23/13 1353 Last data filed at 07/23/13 0800  Gross per 24 hour  Intake 1862.5 ml  Output   1250 ml  Net  612.5 ml   Filed Weights   07/17/13 2031 07/20/13 0425 07/22/13 0530  Weight: 138.2 kg (304 lb 10.8 oz) 139.6 kg (307 lb 12.2 oz) 133.7 kg (294 lb 12.1 oz)    Exam:   General:  Alert afebrile comfortable  Cardiovascular: s1s2  Respiratory: ctab  Abdomen: soft NT ND BS+  Musculoskeletal: trace pedal edema  Data Reviewed: Basic Metabolic Panel:  Recent Labs Lab 07/18/13 0405 07/19/13 0420 07/20/13 0420 07/21/13 0457 07/22/13 0531  NA 141 139 139 144 145  K 3.2* 4.8 4.1 3.8 3.7  CL 103 104 105 104 108   CO2 23 23 23 24 24   GLUCOSE 112* 175* 103* 105* 134*  BUN 38* 36* 42* 67* 59*  CREATININE 1.20 1.08 1.03 1.01 1.01  CALCIUM 8.7 8.3* 8.2* 8.3* 8.1*   Liver Function Tests:  Recent Labs Lab 07/18/13 1800 07/19/13 0420 07/20/13 0420 07/21/13 0457 07/22/13 0531  AST 139* 106* 77* 49* 41*  ALT 157* 139* 118* 97* 70*  ALKPHOS 461* 413* 349* 359* 221*  BILITOT 1.0 0.9 0.6 0.6 0.4  PROT 6.5 6.3 5.9* 6.3 5.4*  ALBUMIN 2.5* 2.4* 2.3* 2.5* 2.3*   No results found for this basename: LIPASE, AMYLASE,  in the last 168 hours No results found for this basename: AMMONIA,  in the last 168 hours CBC:  Recent Labs Lab 07/19/13 0420 07/20/13 0420 07/21/13 0457 07/21/13 1800 07/21/13 2210 07/22/13 0531 07/22/13 2104 07/23/13 0636  WBC 13.8* 14.0* 17.6* 20.1*  --  21.5*  --   --   HGB 10.7* 10.4* 10.6* 9.1* 8.3* 7.8* 8.1* 8.0*  HCT 32.4* 32.3*  32.5* 27.7* 25.5* 23.5* 24.5* 24.5*  MCV 91.5 92.0 91.8 91.7  --  90.4  --   --   PLT 323 304 430* 420*  --  437*  --   --    Cardiac Enzymes:  Recent Labs Lab 07/17/13 2000 07/18/13 0038 07/18/13 0727  TROPONINI <0.30 <0.30 <0.30   BNP (last 3 results)  Recent Labs  07/13/13 1835  PROBNP 519.5*   CBG: No results found for this basename: GLUCAP,  in the last 168 hours  Recent Results (from the past 240 hour(s))  MRSA PCR SCREENING     Status: None   Collection Time    07/18/13  4:03 AM      Result Value Ref Range Status   MRSA by PCR NEGATIVE  NEGATIVE Final   Comment:            The GeneXpert MRSA Assay (FDA     approved for NASAL specimens     only), is one component of a     comprehensive MRSA colonization     surveillance program. It is not     intended to diagnose MRSA     infection nor to guide or     monitor treatment for     MRSA infections.  BODY FLUID CULTURE     Status: None   Collection Time    07/18/13 12:43 PM      Result Value Ref Range Status   Specimen Description FLUID PERICARDIAL   Final   Special  Requests PT ON ZINACEF 1.5    Final   Gram Stain     Final   Value: RARE WBC PRESENT,BOTH PMN AND MONONUCLEAR     NO ORGANISMS SEEN     TECH REVIEW     Performed at [facility]   Culture     Final   Value: NO GROWTH 4 DAYS     Performed at [facility]   Report Status 07/22/2013 FINAL   Final  FUNGUS CULTURE W SMEAR     Status: None   Collection Time    07/18/13 12:43 PM      Result Value Ref Range Status   Specimen Description FLUID PERICARDIAL   Final   Special Requests PT ON ZINACEF 1.5   Final   Fungal Smear     Final   Value: NO YEAST OR FUNGAL ELEMENTS SEEN     Performed at [facility]   Culture     Final   Value: CULTURE IN PROGRESS FOR FOUR WEEKS     Performed at [facility]   Report Status PENDING   Incomplete  ANAEROBIC CULTURE     Status: None   Collection Time    07/18/13 12:43 PM      Result Value Ref Range Status   Specimen Description FLUID PERICARDIAL   Final   Special Requests PT ON ZINACEF 1.5   Final   Gram Stain     Final   Value: RARE WBC PRESENT,BOTH PMN AND MONONUCLEAR     NO ORGANISMS SEEN     Performed at [facility]   Culture     Final   Value: NO ANAEROBES ISOLATED     Performed at [facility]   Report Status 07/23/2013 FINAL   Final  GRAM STAIN     Status: None   Collection Time    07/18/13 12:43 PM      Result Value Ref Range Status  Specimen Description FLUID PERICARDIAL   Final   Special Requests PT ON ZINACEF 1.5   Final   Gram Stain     Final   Value: RARE WBC PRESENT,BOTH PMN AND MONONUCLEAR     NO ORGANISMS SEEN   Report Status 07/18/2013 FINAL   Final  BODY FLUID CULTURE     Status: None   Collection Time    07/18/13 12:43 PM      Result Value Ref Range Status   Specimen Description FLUID PERICARDIAL   Final   Special Requests FLUID ON SWAB PATIEN ON FOLLOWING ZINACEF 1.5   Final   Gram Stain     Final   Value: RARE WBC PRESENT,BOTH PMN AND MONONUCLEAR     NO ORGANISMS SEEN      Performed at [facility]   Culture     Final   Value: NO GROWTH 3 DAYS     Performed at [facility]   Report Status 07/21/2013 FINAL   Final  FUNGUS CULTURE W SMEAR     Status: None   Collection Time    07/18/13 12:43 PM      Result Value Ref Range Status   Specimen Description FLUID PERICARDIAL   Final   Special Requests PT ON ZINACEF 1.5 FLUID ON SWAB   Final   Fungal Smear     Final   Value: NO YEAST OR FUNGAL ELEMENTS SEEN     Performed at [facility]   Culture     Final   Value: CULTURE IN PROGRESS FOR FOUR WEEKS     Performed at [facility]   Report Status PENDING   Incomplete  AFB CULTURE WITH SMEAR     Status: None   Collection Time    07/18/13 12:43 PM      Result Value Ref Range Status   Specimen Description FLUID PERICARDIAL   Final   Special Requests PT ON ZINACEF 1.5 FLUID ON SWAB   Final   Acid Fast Smear     Final   Value: NO ACID FAST BACILLI SEEN     Performed at [facility]   Culture     Final   Value: CULTURE WILL BE EXAMINED FOR 6 WEEKS BEFORE ISSUING A FINAL REPORT     Performed at [facility]   Report Status PENDING   Incomplete  ANAEROBIC CULTURE     Status: None   Collection Time    07/18/13 12:43 PM      Result Value Ref Range Status   Specimen Description FLUID PERICARDIAL   Final   Special Requests FLUID ON SWAB   Final   Gram Stain     Final   Value: RARE WBC PRESENT,BOTH PMN AND MONONUCLEAR     NO ORGANISMS SEEN     Performed at [facility]   Culture     Final   Value: NO ANAEROBES ISOLATED     Performed at [facility]   Report Status 07/23/2013 FINAL   Final  VIRAL CULTURE VIRC     Status: None   Collection Time    07/18/13 12:43 PM      Result Value Ref Range Status   Specimen Description FLUID PERICARDIAL   Final   Special Requests NONE   Final   Culture     Final   Value: Culture has been initiated.     Note:    The current Enterovirus culture system does  not detect Enterovirus D68. If Enterovirus D68 is suspected, please call client services to add the test for Enterovirus PCR (Test code [phone]).     Performed at [facility]   Report Status PENDING   Incomplete     Studies: Dg Chest Port 1 View  07/22/2013   CLINICAL DATA:  Pericardial effusion.  Pericardial window.  EXAM: PORTABLE CHEST - 1 VIEW  COMPARISON:  DG CHEST 1V PORT dated 07/21/2013  FINDINGS: Right IJ line in stable position. Stable cardiomegaly. No pulmonary venous distention. Persistent mild left base subsegmental atelectasis remains. Mild elevation left hemidiaphragm. No prominent pleural effusion or pneumothorax noted. No acute osseous abnormality.  IMPRESSION: 1. Right IJ line in stable position. 2. Stable cardiomegaly. 3. Persistent left base subsegmental atelectasis.   Electronically Signed   By: Marcello Moores  Register   On: 07/22/2013 07:22    Scheduled Meds: . calcium carbonate  250 mg Oral TID WC  . colchicine  0.6 mg Oral BID  . docusate sodium  100 mg Oral BID  . finasteride  5 mg Oral Daily  . furosemide  20 mg Oral Daily  . gabapentin  100 mg Oral BID  . LORazepam  0.5 mg Oral Daily  . LORazepam  1 mg Oral QHS  . metoprolol tartrate  25 mg Oral BID  . multivitamins with iron  1 tablet Oral Daily  . pantoprazole  40 mg Oral BID  . polyethylene glycol  17 g Oral Daily  . risperiDONE  0.5 mg Oral QHS  . rivaroxaban  20 mg Oral Q supper  . sertraline  150 mg Oral Q breakfast  . vitamin B-12  1,000 mcg Oral Daily   Continuous Infusions: . sodium chloride Stopped (07/23/13 0100)    Principal Problem:   CHF (congestive heart failure) Active Problems:   HYPERLIPIDEMIA   HTN (hypertension)   SLEEP APNEA   Morbid obesity   Morbid obesity with BMI of 45.0-49.9, adult   H/O laparoscopic adjustable gastric banding & hiatal hernia repair 04/08/13   Pericardial effusion   Pericarditis   Post viral syndrome   Pulmonary embolism   Hepatitis   Atrial  fibrillation   Pulmonary embolus   Melena   Acute posthemorrhagic anemia   Chronic anticoagulation   Gastric ulcer with hemorrhage    Time spent: 35 min    [facility] Pager [phone]  If 7PM-7AM, please contact night-coverage at [email or web address], password [facility] 07/23/2013, 1:53 PM  LOS: 7 days

## 2013-07-23 NOTE — Progress Notes (Signed)
NUTRITION FOLLOW UP  Intervention:   1. Modify diet; resume PO diet once medically appropriate per MD discretion.  2. Supplements; wife has brought protein shakes from home.  OK to resume per MD 3.  Brief education; provided to patient re: home diet.   NUTRITION DIAGNOSIS:  Inadequate oral intake, ongoing.   Monitor:  1. Food/Beverage; pt meeting >/=90% estimated needs with tolerance.  2. Wt/wt change; monitor trends.  No new weight today 3. Gastrointestinal; pt to have normal bowel function with improvement in LFTs.  LFTs improving, however no BM  Assessment:   Pt admitted with shortness of breath. Pt found to have pericarditis, also developed bloody stool. Pt s/p endoscopy today which showed small ulcer. Pt is known H. Pylori +.  RD collaborated with outpatient RD as well as MD for home diet recommendations for pt.  Outpatient bariatric RD recommends to resume bariatric diet.  MD has approved resume of protein shakes in setting of improved LFTs.  RD met with pt to discuss home diet. Provided above information and answered all questions. Recommended and encouraged pt to schedule an outpatient appointment with [facility] for ongoing assistance with diet. Provided pt and son with contact information.    Height: Ht Readings from Last 1 Encounters:  07/17/13 6' (1.829 m)    Weight Status:   Wt Readings from Last 1 Encounters:  07/22/13 294 lb 12.1 oz (133.7 kg)    Re-estimated needs:  Kcal: 1860-2050 Protein: 145-161g  Fluid: per MD  Skin: non-pitting, generalized edema  Diet Order: Cardiac   Intake/Output Summary (Last 24 hours) at 07/23/13 1242 Last data filed at 07/23/13 0800  Gross per 24 hour  Intake 1862.5 ml  Output   1250 ml  Net  612.5 ml    Last BM: 4/20   Labs:   Recent Labs Lab 07/20/13 0420 07/21/13 0457 07/22/13 0531  NA 139 144 145  K 4.1 3.8 3.7  CL 105 104 108  CO2 '23 24 24  ' BUN 42* 67* 59*  CREATININE 1.03 1.01 1.01  CALCIUM 8.2* 8.3* 8.1*   GLUCOSE 103* 105* 134*    CBG (last 3)  No results found for this basename: GLUCAP,  in the last 72 hours  Scheduled Meds: . calcium carbonate  250 mg Oral TID WC  . colchicine  0.6 mg Oral BID  . docusate sodium  100 mg Oral BID  . finasteride  5 mg Oral Daily  . furosemide  20 mg Oral Daily  . gabapentin  100 mg Oral BID  . LORazepam  0.5 mg Oral Daily  . LORazepam  1 mg Oral QHS  . metoprolol tartrate  25 mg Oral BID  . multivitamins with iron  1 tablet Oral Daily  . pantoprazole  40 mg Oral BID  . polyethylene glycol  17 g Oral Daily  . risperiDONE  0.5 mg Oral QHS  . rivaroxaban  20 mg Oral Q supper  . sertraline  150 mg Oral Q breakfast  . vitamin B-12  1,000 mcg Oral Daily    Continuous Infusions: . sodium chloride Stopped (07/23/13 0100)    Brynda Greathouse, MS RD LDN Clinical Inpatient Dietitian Pager: [phone] Weekend/After hours pager: [phone]

## 2013-07-23 NOTE — Progress Notes (Signed)
Daily Rounding Note  07/23/2013, 8:34 AM  LOS: 7 days   SUBJECTIVE:       No BMs in 2 days.  No problems with diet. Not dizzy but still on bedrest.  OBJECTIVE:         Vital signs in last 24 hours:    Temp:  [97.6 F (36.4 C)-98.9 F (37.2 C)] 98.9 F (37.2 C) (04/22 0725) Pulse Rate:  [51-92] 63 (04/22 0725) Resp:  [9-32] 27 (04/22 0500) BP: (84-122)/(42-74) 103/52 mmHg (04/22 0700) SpO2:  [88 %-100 %] 98 % (04/22 0725) Last BM Date: 07/21/13 General: looks well, comfortable.   Heart: RRR, intact short lower chest incision intact and benign Chest: clear bil.   Abdomen: soft, obese, NT.  Port for lap band palpable in RUQ.  Extremities: no CCE Neuro/Psych:  Pleasant, oriented x 3.  In good spirits.   Intake/Output from previous day: 04/21 0701 - 04/22 0700 In: 1662.5 [P.O.:480; I.V.:1082.5; IV Piggyback:100] Out: 1450 [Urine:1450]  Intake/Output this shift:    Lab Results:  Recent Labs  07/21/13 0457 07/21/13 1800  07/22/13 0531 07/22/13 2104 07/23/13 0636  WBC 17.6* 20.1*  --  21.5*  --   --   HGB 10.6* 9.1*  < > 7.8* 8.1* 8.0*  HCT 32.5* 27.7*  < > 23.5* 24.5* 24.5*  PLT 430* 420*  --  437*  --   --   < > = values in this interval not displayed. BMET  Recent Labs  07/21/13 0457 07/22/13 0531  NA 144 145  K 3.8 3.7  CL 104 108  CO2 24 24  GLUCOSE 105* 134*  BUN 67* 59*  CREATININE 1.01 1.01  CALCIUM 8.3* 8.1*   LFT  Recent Labs  07/21/13 0457 07/22/13 0531  PROT 6.3 5.4*  ALBUMIN 2.5* 2.3*  AST 49* 41*  ALT 97* 70*  ALKPHOS 359* 221*  BILITOT 0.6 0.4   PT/INR No results found for this basename: LABPROT, INR,  in the last 72 hours Hepatitis Panel No results found for this basename: HEPBSAG, HCVAB, HEPAIGM, HEPBIGM,  in the last 72 hours  Scheduled Meds: . calcium carbonate  250 mg Oral TID WC  . colchicine  0.6 mg Oral BID  . docusate sodium  100 mg Oral BID  . finasteride   5 mg Oral Daily  . furosemide  20 mg Oral Daily  . gabapentin  100 mg Oral BID  . LORazepam  0.5 mg Oral Daily  . LORazepam  1 mg Oral QHS  . metoprolol tartrate  25 mg Oral BID  . multivitamins with iron  1 tablet Oral Daily  . pantoprazole  40 mg Oral BID  . polyethylene glycol  17 g Oral Daily  . risperiDONE  0.5 mg Oral QHS  . sertraline  150 mg Oral Q breakfast  . sodium chloride  10-40 mL Intravenous Q12H  . sodium chloride  3 mL Intravenous Q12H  . vitamin B-12  1,000 mcg Oral Daily   Continuous Infusions: . sodium chloride Stopped (07/23/13 0100)  . sodium chloride 50 mL/hr at 07/23/13 0600   PRN Meds:.fentaNYL, ondansetron (ZOFRAN) IV, ondansetron, sodium chloride, sodium chloride   ASSESMENT:   * Melenic stool with elevated BUN, began 4/20. Suspect UGI bleed in setting of Xarelto. + serum H Pylori and breath tech test fall 2014, not yet treated.  EGD 07/22/13: Superficial gastric ulcer (not actively bleeding off anticoagulants) likely cause for bleeding. No  PPI PTA. Protonix drip switched to po BID 4/21.  * Constipation since lap band, acutely worse this admission.  He has been taking TID oral calcium and MVI once daily with iron at advisement of MDs since lap band, this like a large contributing factor to hard, constipated stools. Colace BID, Miralax once daily added today.  *  Azotemia, improved yesterday but no BUN today.   * ABL anemia. 2 units PRBCs transfused 4/21 with no boost in Hgb level (Hgb essentially stable), hgb nadir 7.8.   * Elevated LFTs, improved. Abdominal ultrasound unremarkable. GB absent. ? suspect shock liver/hypoperfusion due to pericardial effusion and A fib  * Hx PE, Atrial fibrillation, currently in NSR. Chronic Xarelto, curently on hold. Last dose 530 PM 07/21/13 * 4/17 pericardial window, drainage of pericardial effusion felt to be viral in origin  * 04/08/13 lap band and small HH repair, steady weight loss since. Nofluid infused into the lap band  since initial placement. [name]     PLAN   *  Continue diet per heart healthy and post bariatric guidelines. *  Ok to restart Xarelto.  *  Titrate Colace and Miralax to resolve constipation.  *  BID H & H.  BMET in AM.  Up with assist.  *  Rx for H Pylori eradication at discharge (Biaxin, Metronidazole, PPI given PCN allergy, d/w Dr Coralyn Pear today).    Vena Rua  07/23/2013, 8:34 AM Pager: [phone]  G.I. ATTENDING   Interval history and the data reviewed. Patient seen and examined. Agree with H&P as above. Family room. No evidence for G.I. bleeding. On PPI. CLO pending. continue PPI. Treat H pylori positive. Not urgent. He will see me later this year for his surveillance colonoscopy. We'll sign off  Harold Moncus N. Geri Seminole., M.D. [facility] Division of Gastroenterology

## 2013-07-23 NOTE — Progress Notes (Addendum)
      [address]       [name]             [phone]       1 Day Post-Op Procedure(s) (LRB): ESOPHAGOGASTRODUODENOSCOPY (EGD) (N/A)  Subjective: Patient feeling better this morning. He is hungry.  Objective: Vital signs in last 24 hours: Temp:  [97.6 F (36.4 C)-98.9 F (37.2 C)] 98.9 F (37.2 C) (04/22 0725) Pulse Rate:  [51-92] 63 (04/22 0725) Cardiac Rhythm:  [-] Normal sinus rhythm (04/21 2000) Resp:  [9-32] 27 (04/22 0500) BP: (84-122)/(42-74) 103/52 mmHg (04/22 0700) SpO2:  [88 %-100 %] 98 % (04/22 0725)     Intake/Output from previous day: 04/21 0701 - 04/22 0700 In: 1662.5 [P.O.:480; I.V.:1082.5; IV Piggyback:100] Out: 1450 [Urine:1450]   Physical Exam:  Cardiovascular: RRR Pulmonary: Clear to auscultation on right and slightly diminished on left; no rales, wheezes, or rhonchi. Wound: Clean and dry.  No erythema or signs of infection.   Lab Results: CBC: Recent Labs  07/21/13 1800  07/22/13 0531 07/22/13 2104 07/23/13 0636  WBC 20.1*  --  21.5*  --   --   HGB 9.1*  < > 7.8* 8.1* 8.0*  HCT 27.7*  < > 23.5* 24.5* 24.5*  PLT 420*  --  437*  --   --   < > = values in this interval not displayed. BMET:   Recent Labs  07/21/13 0457 07/22/13 0531  NA 144 145  K 3.8 3.7  CL 104 108  CO2 24 24  GLUCOSE 105* 134*  BUN 67* 59*  CREATININE 1.01 1.01  CALCIUM 8.3* 8.1*    PT/INR: No results found for this basename: LABPROT, INR,  in the last 72 hours ABG:  INR: Will add last result for INR, ABG once components are confirmed Will add last 4 CBG results once components are confirmed  Assessment/Plan:  1. CV - Previous A fib with RVR.  Given Diltiazem drip and Digoxin. Converted to SR yesterday. On Lopressor 25 bid. BP somewhat labile.Per cardiology 2.  Pulmonary - Encourage incentive spirometer. Cultures negative for malignancy.  ? Previous pericarditis as had viral infection several months ago. On colchicine.  Small, subsegmental PE. He was started on Xarelto but was stopped after bloody like stools. 3. Anemia-Given 2 units of PRBCs. H and H 8 and 24.5. Occult blood positive. Superficial gastric ulcer seen on endoscopy yesterday.On PPI . 4. Chest tube suture may be removed on 07/27/2013. 5. Continue management per medicine.  [name] M ZimmermanPA-C 07/23/2013,8:17 AM  I have seen and examined [name] and agree with the above assessment  and plan.  Grace Isaac MD Beeper [phone] Office [phone] 07/23/2013 12:47 PM

## 2013-07-23 NOTE — Progress Notes (Signed)
Patient ID: BLONG BUSK, male   DOB: 13-Feb-1946, 68 y.o.   MRN: [id number]      SUBJECTIVE: No further bowel movement. No abdominal pain. Right IJ central catheter is bothering him. No further bouts of atrial fibrillation with rapid ventricular response.   Scheduled Meds: . calcium carbonate  250 mg Oral TID WC  . colchicine  0.6 mg Oral BID  . docusate sodium  100 mg Oral BID  . finasteride  5 mg Oral Daily  . furosemide  20 mg Oral Daily  . gabapentin  100 mg Oral BID  . LORazepam  0.5 mg Oral Daily  . LORazepam  1 mg Oral QHS  . metoprolol tartrate  25 mg Oral BID  . multivitamins with iron  1 tablet Oral Daily  . pantoprazole  40 mg Oral BID  . polyethylene glycol  17 g Oral Daily  . risperiDONE  0.5 mg Oral QHS  . Rivaroxaban  15 mg Oral BID WC  . Rivaroxaban  15 mg Oral Once  . [START ON 08/06/2013] rivaroxaban  20 mg Oral Q supper  . sertraline  150 mg Oral Q breakfast  . vitamin B-12  1,000 mcg Oral Daily   Continuous Infusions: . sodium chloride Stopped (07/23/13 0100)   PRN Meds:.fentaNYL, ondansetron (ZOFRAN) IV, ondansetron, sodium chloride    Filed Vitals:   07/23/13 0700 07/23/13 0725 07/23/13 0900 07/23/13 1100  BP: 103/52  102/54 102/57  Pulse:  63 69 61  Temp:  98.9 F (37.2 C)    TempSrc:  Oral    Resp:   13 19  Height:      Weight:      SpO2:  98% 98% 98%    Intake/Output Summary (Last 24 hours) at 07/23/13 1203 Last data filed at 07/23/13 0800  Gross per 24 hour  Intake 1862.5 ml  Output   1250 ml  Net  612.5 ml    LABS: Basic Metabolic Panel:  Recent Labs  07/21/13 0457 07/22/13 0531  NA 144 145  K 3.8 3.7  CL 104 108  CO2 24 24  GLUCOSE 105* 134*  BUN 67* 59*  CREATININE 1.01 1.01  CALCIUM 8.3* 8.1*   Liver Function Tests:  Recent Labs  07/21/13 0457 07/22/13 0531  AST 49* 41*  ALT 97* 70*  ALKPHOS 359* 221*  BILITOT 0.6 0.4  PROT 6.3 5.4*  ALBUMIN 2.5* 2.3*    CBC:  Recent Labs  07/21/13 1800   07/22/13 0531 07/22/13 2104 07/23/13 0636  WBC 20.1*  --  21.5*  --   --   HGB 9.1*  < > 7.8* 8.1* 8.0*  HCT 27.7*  < > 23.5* 24.5* 24.5*  MCV 91.7  --  90.4  --   --   PLT 420*  --  437*  --   --   < > = values in this interval not displayed.  PHYSICAL EXAM General: NAD Neck: Thick, no thyromegaly or thyroid nodule. RIJ cath Lungs: Clear to auscultation bilaterally with normal respiratory effort. CV: Nondisplaced PMI.  Heart regular S1/S2, no S3/S4, no murmur.  Trace ankle edema. Pericardial drain removed. Abdomen: Soft, nontender, no hepatosplenomegaly, no distention. Obese.  Neurologic: Alert and oriented x 3.  Psych: Normal affect. Extremities: No clubbing or cyanosis.   TELEMETRY: Reviewed telemetry pt in NSR  ASSESSMENT AND PLAN:  68 yo with history of HTN s/p lap band in 1/15 was admitted with atrial fibrillation/RVR symptomatic and the finding of a pulmonary  embolus (small burden).    CTA that found PE also showed a moderate to large pericardial effusion.  Echo showed no definite tamponade, but patient had pericardial window 4/17 for diagnostic and therapeutic purposes.   GIB - ENDO with small gastric ulcer. OK to resume Xarelto.    1. Atrial fibrillation: Paroxsymal and symptomatic.This may be secondary to PE or pericardial effusion or general inflammatory process .  Now back in sinus bradycardia.   - Xarelto now resumed, ok with GI. Changed dosage. 20mg  QD. Stopped Xarelto previously 4/21, GIB. - Continue low dose metoprolol.  - Address AFIB with RVR if returns. OK with dilt IV as tolerated.   2. Pericardial effusion: Bloody effusion. Etiology is uncertain. Pericardial effusion initially noted on CTA when PE was detected.  Possible prior pericarditis (severe presumed viral infection several months ago), ?worsened by anticoagulation for PE.  Pericardial window 4/18 for diagnosis and treatment.  Gram stain negative, pathology negative. He is on colchicine, continue x 3  months.   -Pericardial drain DC'd 4/20. Per TCTS team. Note reviewed.   -  DCing Right IJ line.   3. Pulmonary embolus: Again, etiology uncertain.  Had surgery but back in 1/15.  So far hypercoagulable workup negative.  Back on Xarelto held previously with GIB. Thankfully, small clot burden. No DVT on LE Dopplers 4/16.  Will go ahead on place on Xarelto 20mg  instead of 15 mg BID because of recent bleed.   4. CHF: Acute diastolic CHF.  Lasix to po 20mg .   5. GIB. Endoscopy - small gastric ulcer.   Okay to transfer to floor. Will relate to Dr. Coralyn Pear. Had long conversation with he and his son. His son is Licensed conveyancer [employer]. They expressed their gratitude and thanks for excellent care by team here in hospital.   Candee Furbish 07/23/2013 12:03 PM

## 2013-07-24 LAB — BASIC METABOLIC PANEL
BUN: 30 mg/dL — ABNORMAL HIGH (ref 6–23)
CHLORIDE: 108 meq/L (ref 96–112)
CO2: 24 mEq/L (ref 19–32)
Calcium: 7.8 mg/dL — ABNORMAL LOW (ref 8.4–10.5)
Creatinine, Ser: 1.16 mg/dL (ref 0.50–1.35)
GFR calc non Af Amer: 63 mL/min — ABNORMAL LOW (ref >90)
GFR, EST AFRICAN AMERICAN: 73 mL/min — AB (ref >90)
Glucose, Bld: 94 mg/dL (ref 70–99)
POTASSIUM: 4.2 meq/L (ref 3.7–5.3)
Sodium: 143 mEq/L (ref 137–147)

## 2013-07-24 LAB — HEMOGLOBIN AND HEMATOCRIT, BLOOD
HCT: 23.5 % — ABNORMAL LOW (ref 39.0–52.0)
Hemoglobin: 7.7 g/dL — ABNORMAL LOW (ref 13.0–17.0)

## 2013-07-24 LAB — OCCULT BLOOD X 1 CARD TO LAB, STOOL: Fecal Occult Bld: POSITIVE — AB

## 2013-07-24 NOTE — Progress Notes (Signed)
      [address]       [place] [address]             [phone]       2 Days Post-Op Procedure(s) (LRB): ESOPHAGOGASTRODUODENOSCOPY (EGD) (N/A)  Subjective: Patient in good spirits. He states he is moving out of ICU to a "regular floor"  Objective: Vital signs in last 24 hours: Temp:  [98.5 F (36.9 C)-98.8 F (37.1 C)] 98.5 F (36.9 C) (04/23 0340) Pulse Rate:  [56-69] 66 (04/23 0340) Cardiac Rhythm:  [-] Normal sinus rhythm (04/23 0410) Resp:  [11-26] 26 (04/23 0340) BP: (95-107)/(40-61) 102/55 mmHg (04/23 0340) SpO2:  [97 %-99 %] 97 % (04/23 0340)     Intake/Output from previous day: 04/22 0701 - 04/23 0700 In: 720 [P.O.:720] Out: 475 [Urine:475]   Physical Exam:  Cardiovascular: RRR Pulmonary: Clear to auscultation on right and slightly diminished on left; no rales, wheezes, or rhonchi. Wound: Clean and dry.  No erythema or signs of infection.   Lab Results: CBC: Recent Labs  07/21/13 1800  07/22/13 0531  07/23/13 2030 07/24/13 0255  WBC 20.1*  --  21.5*  --   --   --   HGB 9.1*  < > 7.8*  < > 7.8* 7.7*  HCT 27.7*  < > 23.5*  < > 23.6* 23.5*  PLT 420*  --  437*  --   --   --   < > = values in this interval not displayed. BMET:   Recent Labs  07/22/13 0531 07/24/13 0255  NA 145 143  K 3.7 4.2  CL 108 108  CO2 24 24  GLUCOSE 134* 94  BUN 59* 30*  CREATININE 1.01 1.16  CALCIUM 8.1* 7.8*    PT/INR: No results found for this basename: LABPROT, INR,  in the last 72 hours ABG:  INR: Will add last result for INR, ABG once components are confirmed Will add last 4 CBG results once components are confirmed  Assessment/Plan:  1. CV - Previous A fib with RVR.  Given Diltiazem drip and Digoxin. Remaining in SR. On Lopressor 25 bid. SBP remains in low 100's.Per cardiology 2.  Pulmonary - Encourage incentive spirometer. Cultures negative for malignancy.  ? Previous pericarditis as had viral infection several months ago. On  colchicine. Small, subsegmental PE. He was started on Xarelto but was stopped after bloody like stools. Per Dr. Henrene Pastor, ok to restart Xarelto, which was done yesterday. 3. Anemia-Previously given 2 units of PRBCs. H and H 7.7 and 23.5. Occult blood positive. Superficial gastric ulcer seen on endoscopy.On PPI . 4. Chest tube suture may be removed on 07/27/2013. 5. Continue management per medicine.  [name] Stukey M ZimmermanPA-C 07/24/2013,8:10 AM  I have seen and examined [name] and agree with the above assessment  and plan.  Grace Isaac MD Beeper [phone] Office [phone] 07/24/2013 8:10 AM

## 2013-07-24 NOTE — Progress Notes (Signed)
[facility] PROGRESS NOTE  TYJAI MATUSZAK ZOX:[id number] DOB: 04/18/1945 DOA: 07/16/2013 PCP: [name], MD Interim summary: [name] is an 68 y.o. male with hx of bariatric surgery one year ago, lost about 15 lbs total before and after surgery, hx of HTN, obesity and sleep apnea, gout, depression, BPH, noted that he had a severe "viral illness" about 3 months ago, followed by a persistent but no productive cough, wheezing and shortness of breath. He was seen at the urgent care, Dx with URI, and given symptomatic Tx. He saw his PCP, who was concerned about a PE, and thus started him on Xarelto and Lasix, and he presented to the ER today with same symptoms. He said his chest pain was with movement, better with lying down and with palpation. He has no orthopnea or PND. He denied fever, chills, nausea, vomiting or diaphoresis. Evaluation in the ER showed EKG with no ST elevation, in NSR, with S1Q3 and reverse T wave in the precordial leads. A CTPA was done, and it showed a small clot burden acute pulmonary edema, with pericardial effusion along with bilateral pleural effusion. He denied rash, joint pain, family hx of rhematoid arthritis, or lupus. [facility] was asked to admit him for PE and pericarditis. Echocardiogram was done, showed moderate to large pericardial effusion, then cardiology was consulted and he was put on IV lasix. Meanwhile the night of 4/16 he went in to afib with RVR, probably secondary to PE and was transferred to step down and started on cardizem drip. He converted to sinus the same night , cardizem discontinued and he was resumed on metoprolol. CT surgery was consulted for pericardial window. He underwent pericardial window on 4/17 and the hemorrhagic fluid was sent for analysis. He was also started on colchicine for his pericarditis.  So far his gram stain and cultures have been negative. Drainage from the chest tube minimal at this time and his chest tube was  discontinued on 4/20. The evening of 4/20 pt went back in to afib with RVR and had bloody bowel movement. His xarelto was discontinued and he was put on cardizem gtt. Over the next 24 hours his hemoglobin dropped to 7.8 and he received 2 units of prbc transfusion. His repeat H&H is pending. Dr Henrene Pastor was consulted from GI on 4/20 and he underwent endoscopy on 4/21 , . He was found to have superficial ulcers in the stomach, underwent biopsy of the area. He was recommended to continue on PPI twice daily for 2 weeks and resume xarelto. Dr Redmond Pulling from surgery was also consulted for evaluation of the lap band surgery.   Assessment/Plan: 1. Small subsegmental PE: - patient was initially started on xarelto, but was stopped 4/20 after an episode of bloody BM. His last xarelto dose was on 4/20.  - lower extremity dopplers and negative for DVT. Unclear etiology. His hypercoagulable work up negative so far.  - Xarelto restarted on 07/23/13 -Currently stable, plan to transfer to telemetry today.    2. Moderate pericardial effusion/ extensive pericarditis.:  - unclear etiology, idiopathic.  - echo done showed moderate pericardial effusion. No evidence of hemodynamic compromise.  - CT surgery consulted for pericardial window.  He underwent pericardial window , and was found to have severe hemorraghic pericarditis. Fluid sent for analysis including cytology, microbiology and culture. So far infectious etiology is negative and path is negative for malignancy, showed reactive mesothelial cells.   - started pt on colchicine and pain medications. His toradol was stopped  after his episode of bloody BM.    3. Leukocytosis:  - he remains afebrile and his max temp is 99.1. His leukocytosis worsened over the last 48 hours. Pro calcitonin levels ordered.  - unclear etiology, probably reactive, patient is nontoxic, afebrile  4.  Recurrent Atrial fibrillation with RVR:  - he initially went into Afib with RVR on the evening  of 4/16, he was started on cardizem gtt, later transitioned to home metoprolol when he converted to sinus.  -Hg stable at 7.7 from 7.8 on 07/23/13    5. Elevated liver function tests: - denies any abdominal pain.  Unclear etiology. Continue to monitor.   - US abdomen negative . Liver function panel shows improvement.   6. Obstructive sleep apnea: - on CPAP at home.   7. Gout: - stable.   8. Hypertension: controlled.  9. Bloody and black stools. : - he initially complained of constipation and was given dulcolax suppository the night of 4/19. Since then pt reported 8 loose BM'S/  - on 4/20 pt reported one blood bowel movement , his BUN elevated 67 with baseline creatinine.  - his xarelto was held and he underwent EGD , showing asuperficial ulcer with no active bleed.  - Changed to Protonix BID.   DVT prophylaxis.   Code Status: full code Family Communication: wife at bedside Disposition Plan: Transfer to Telemetry   Consultants:  Cardiology Dr Debara Pickett.  CT surgery   Gastroenterology Dr Henrene Pastor.   Procedures:  Echocardiogram  Pericardial window on 4/17  Antibiotics:  none  HPI/SubjectivePatient is awake and alert, thinks he is feeling better. He denies bright red blood per rectum or hematemesis  Objective: Filed Vitals:   07/24/13 0810  BP: 100/54  Pulse:   Temp: 97.9 F (36.6 C)  Resp:     Intake/Output Summary (Last 24 hours) at 07/24/13 1248 Last data filed at 07/24/13 0800  Gross per 24 hour  Intake    480 ml  Output    975 ml  Net   -495 ml   Filed Weights   07/17/13 2031 07/20/13 0425 07/22/13 0530  Weight: 138.2 kg (304 lb 10.8 oz) 139.6 kg (307 lb 12.2 oz) 133.7 kg (294 lb 12.1 oz)    Exam:   General:  Alert afebrile comfortable  Cardiovascular: s1s2  Respiratory: ctab  Abdomen: soft NT ND BS+  Musculoskeletal: trace pedal edema  Data Reviewed: Basic Metabolic Panel:  Recent Labs Lab 07/19/13 0420 07/20/13 0420 07/21/13 0457  07/22/13 0531 07/24/13 0255  NA 139 139 144 145 143  K 4.8 4.1 3.8 3.7 4.2  CL 104 105 104 108 108  CO2 23 23 24 24 24   GLUCOSE 175* 103* 105* 134* 94  BUN 36* 42* 67* 59* 30*  CREATININE 1.08 1.03 1.01 1.01 1.16  CALCIUM 8.3* 8.2* 8.3* 8.1* 7.8*   Liver Function Tests:  Recent Labs Lab 07/18/13 1800 07/19/13 0420 07/20/13 0420 07/21/13 0457 07/22/13 0531  AST 139* 106* 77* 49* 41*  ALT 157* 139* 118* 97* 70*  ALKPHOS 461* 413* 349* 359* 221*  BILITOT 1.0 0.9 0.6 0.6 0.4  PROT 6.5 6.3 5.9* 6.3 5.4*  ALBUMIN 2.5* 2.4* 2.3* 2.5* 2.3*   No results found for this basename: LIPASE, AMYLASE,  in the last 168 hours No results found for this basename: AMMONIA,  in the last 168 hours CBC:  Recent Labs Lab 07/19/13 0420 07/20/13 0420 07/21/13 0457 07/21/13 1800  07/22/13 0531 07/22/13 2104 07/23/13 0636 07/23/13 2030 07/24/13  0255  WBC 13.8* 14.0* 17.6* 20.1*  --  21.5*  --   --   --   --   HGB 10.7* 10.4* 10.6* 9.1*  < > 7.8* 8.1* 8.0* 7.8* 7.7*  HCT 32.4* 32.3* 32.5* 27.7*  < > 23.5* 24.5* 24.5* 23.6* 23.5*  MCV 91.5 92.0 91.8 91.7  --  90.4  --   --   --   --   PLT 323 304 430* 420*  --  437*  --   --   --   --   < > = values in this interval not displayed. Cardiac Enzymes:  Recent Labs Lab 07/17/13 2000 07/18/13 0038 07/18/13 0727  TROPONINI <0.30 <0.30 <0.30   BNP (last 3 results)  Recent Labs  07/13/13 1835  PROBNP 519.5*   CBG: No results found for this basename: GLUCAP,  in the last 168 hours  Recent Results (from the past 240 hour(s))  MRSA PCR SCREENING     Status: None   Collection Time    07/18/13  4:03 AM      Result Value Ref Range Status   MRSA by PCR NEGATIVE  NEGATIVE Final   Comment:            The GeneXpert MRSA Assay (FDA     approved for NASAL specimens     only), is one component of a     comprehensive MRSA colonization     surveillance program. It is not     intended to diagnose MRSA     infection nor to guide or      monitor treatment for     MRSA infections.  BODY FLUID CULTURE     Status: None   Collection Time    07/18/13 12:43 PM      Result Value Ref Range Status   Specimen Description FLUID PERICARDIAL   Final   Special Requests PT ON ZINACEF 1.5    Final   Gram Stain     Final   Value: RARE WBC PRESENT,BOTH PMN AND MONONUCLEAR     NO ORGANISMS SEEN     TECH REVIEW     Performed at [facility]   Culture     Final   Value: NO GROWTH 4 DAYS     Performed at [facility]   Report Status 07/22/2013 FINAL   Final  FUNGUS CULTURE W SMEAR     Status: None   Collection Time    07/18/13 12:43 PM      Result Value Ref Range Status   Specimen Description FLUID PERICARDIAL   Final   Special Requests PT ON ZINACEF 1.5   Final   Fungal Smear     Final   Value: NO YEAST OR FUNGAL ELEMENTS SEEN     Performed at [facility]   Culture     Final   Value: CULTURE IN PROGRESS FOR FOUR WEEKS     Performed at [facility]   Report Status PENDING   Incomplete  ANAEROBIC CULTURE     Status: None   Collection Time    07/18/13 12:43 PM      Result Value Ref Range Status   Specimen Description FLUID PERICARDIAL   Final   Special Requests PT ON ZINACEF 1.5   Final   Gram Stain     Final   Value: RARE WBC PRESENT,BOTH PMN AND MONONUCLEAR     NO ORGANISMS SEEN     Performed  at [facility]   Culture     Final   Value: NO ANAEROBES ISOLATED     Performed at [facility]   Report Status 07/23/2013 FINAL   Final  GRAM STAIN     Status: None   Collection Time    07/18/13 12:43 PM      Result Value Ref Range Status   Specimen Description FLUID PERICARDIAL   Final   Special Requests PT ON ZINACEF 1.5   Final   Gram Stain     Final   Value: RARE WBC PRESENT,BOTH PMN AND MONONUCLEAR     NO ORGANISMS SEEN   Report Status 07/18/2013 FINAL   Final  BODY FLUID CULTURE     Status: None   Collection Time    07/18/13 12:43 PM      Result Value Ref Range Status    Specimen Description FLUID PERICARDIAL   Final   Special Requests FLUID ON SWAB PATIEN ON FOLLOWING ZINACEF 1.5   Final   Gram Stain     Final   Value: RARE WBC PRESENT,BOTH PMN AND MONONUCLEAR     NO ORGANISMS SEEN     Performed at [facility]   Culture     Final   Value: NO GROWTH 3 DAYS     Performed at [facility]   Report Status 07/21/2013 FINAL   Final  FUNGUS CULTURE W SMEAR     Status: None   Collection Time    07/18/13 12:43 PM      Result Value Ref Range Status   Specimen Description FLUID PERICARDIAL   Final   Special Requests PT ON ZINACEF 1.5 FLUID ON SWAB   Final   Fungal Smear     Final   Value: NO YEAST OR FUNGAL ELEMENTS SEEN     Performed at [facility]   Culture     Final   Value: CULTURE IN PROGRESS FOR FOUR WEEKS     Performed at [facility]   Report Status PENDING   Incomplete  AFB CULTURE WITH SMEAR     Status: None   Collection Time    07/18/13 12:43 PM      Result Value Ref Range Status   Specimen Description FLUID PERICARDIAL   Final   Special Requests PT ON ZINACEF 1.5 FLUID ON SWAB   Final   Acid Fast Smear     Final   Value: NO ACID FAST BACILLI SEEN     Performed at [facility]   Culture     Final   Value: CULTURE WILL BE EXAMINED FOR 6 WEEKS BEFORE ISSUING A FINAL REPORT     Performed at [facility]   Report Status PENDING   Incomplete  ANAEROBIC CULTURE     Status: None   Collection Time    07/18/13 12:43 PM      Result Value Ref Range Status   Specimen Description FLUID PERICARDIAL   Final   Special Requests FLUID ON SWAB   Final   Gram Stain     Final   Value: RARE WBC PRESENT,BOTH PMN AND MONONUCLEAR     NO ORGANISMS SEEN     Performed at [facility]   Culture     Final   Value: NO ANAEROBES ISOLATED     Performed at [facility]   Report Status 07/23/2013 FINAL   Final  VIRAL CULTURE VIRC     Status:  None   Collection Time    07/18/13 12:43 PM      Result  Value Ref Range Status   Specimen Description FLUID PERICARDIAL   Final   Special Requests NONE   Final   Culture     Final   Value: CONTINUING TO HOLD     Note:    The current Enterovirus culture system does not detect Enterovirus D68. If Enterovirus D68 is suspected, please call client services to add the test for Enterovirus PCR (Test code [phone]).     Performed at [facility]   Report Status PENDING   Incomplete     Studies: No results found.  Scheduled Meds: . calcium carbonate  250 mg Oral TID WC  . colchicine  0.6 mg Oral BID  . docusate sodium  100 mg Oral BID  . finasteride  5 mg Oral Daily  . furosemide  20 mg Oral Daily  . gabapentin  100 mg Oral BID  . LORazepam  0.5 mg Oral Daily  . LORazepam  1 mg Oral QHS  . metoprolol tartrate  25 mg Oral BID  . multivitamins with iron  1 tablet Oral Daily  . pantoprazole  40 mg Oral BID  . polyethylene glycol  17 g Oral Daily  . risperiDONE  0.5 mg Oral QHS  . rivaroxaban  20 mg Oral Q supper  . sertraline  150 mg Oral Q breakfast  . vitamin B-12  1,000 mcg Oral Daily   Continuous Infusions: . sodium chloride Stopped (07/23/13 0100)    Principal Problem:   CHF (congestive heart failure) Active Problems:   HYPERLIPIDEMIA   HTN (hypertension)   SLEEP APNEA   Morbid obesity   Morbid obesity with BMI of 45.0-49.9, adult   H/O laparoscopic adjustable gastric banding & hiatal hernia repair 04/08/13   Pericardial effusion   Pericarditis   Post viral syndrome   Pulmonary embolism   Hepatitis   Atrial fibrillation   Pulmonary embolus   Melena   Acute posthemorrhagic anemia   Chronic anticoagulation   Gastric ulcer with hemorrhage    Time spent: 35 min    [facility] Pager [phone]  If 7PM-7AM, please contact night-coverage at [email or web address], password [facility] 07/24/2013, 12:48 PM  LOS: 8 days

## 2013-07-24 NOTE — Progress Notes (Signed)
Attempted to give report; RN to call back to receive patient report.

## 2013-07-24 NOTE — Progress Notes (Signed)
Patient ID: [name], male   DOB: 18-Jan-1946, 68 y.o.   MRN: [id number]      SUBJECTIVE: Bowel movement may have had some old dried blood. No abdominal pain. No further bouts of atrial fibrillation with rapid ventricular response.   Scheduled Meds: . calcium carbonate  250 mg Oral TID WC  . colchicine  0.6 mg Oral BID  . docusate sodium  100 mg Oral BID  . finasteride  5 mg Oral Daily  . furosemide  20 mg Oral Daily  . gabapentin  100 mg Oral BID  . LORazepam  0.5 mg Oral Daily  . LORazepam  1 mg Oral QHS  . metoprolol tartrate  25 mg Oral BID  . multivitamins with iron  1 tablet Oral Daily  . pantoprazole  40 mg Oral BID  . polyethylene glycol  17 g Oral Daily  . risperiDONE  0.5 mg Oral QHS  . rivaroxaban  20 mg Oral Q supper  . sertraline  150 mg Oral Q breakfast  . vitamin B-12  1,000 mcg Oral Daily   Continuous Infusions: . sodium chloride Stopped (07/23/13 0100)   PRN Meds:.fentaNYL, ondansetron (ZOFRAN) IV, ondansetron, sodium chloride    Filed Vitals:   07/23/13 2158 07/23/13 2343 07/24/13 0340 07/24/13 0810  BP:  107/40 102/55 100/54  Pulse: 59 68 66   Temp:  98.8 F (37.1 C) 98.5 F (36.9 C) 97.9 F (36.6 C)  TempSrc:  Axillary Oral Oral  Resp: 21 18 26    Height:      Weight:      SpO2: 97% 97% 97% 97%    Intake/Output Summary (Last 24 hours) at 07/24/13 1204 Last data filed at 07/24/13 0800  Gross per 24 hour  Intake    480 ml  Output    975 ml  Net   -495 ml    LABS: Basic Metabolic Panel:  Recent Labs  07/22/13 0531 07/24/13 0255  NA 145 143  K 3.7 4.2  CL 108 108  CO2 24 24  GLUCOSE 134* 94  BUN 59* 30*  CREATININE 1.01 1.16  CALCIUM 8.1* 7.8*   Liver Function Tests:  Recent Labs  07/22/13 0531  AST 41*  ALT 70*  ALKPHOS 221*  BILITOT 0.4  PROT 5.4*  ALBUMIN 2.3*    CBC:  Recent Labs  07/21/13 1800  07/22/13 0531  07/23/13 2030 07/24/13 0255  WBC 20.1*  --  21.5*  --   --   --   HGB 9.1*  < > 7.8*  < >  7.8* 7.7*  HCT 27.7*  < > 23.5*  < > 23.6* 23.5*  MCV 91.7  --  90.4  --   --   --   PLT 420*  --  437*  --   --   --   < > = values in this interval not displayed.  PHYSICAL EXAM General: NAD Neck: Thick, no thyromegaly or thyroid nodule.  Lungs: Clear to auscultation bilaterally with normal respiratory effort. CV: Nondisplaced PMI.  Heart regular S1/S2, no S3/S4, no murmur.  Trace ankle edema. Pericardial drain removed. Abdomen: Soft, nontender, no hepatosplenomegaly, no distention. Obese.  Neurologic: Alert and oriented x 3.  Psych: Normal affect. Extremities: No clubbing or cyanosis.   TELEMETRY: Reviewed telemetry pt in NSR  ASSESSMENT AND PLAN:  68 yo with history of HTN s/p lap band in 1/15 was admitted with atrial fibrillation/RVR symptomatic and the finding of a pulmonary embolus (small burden).  CTA that found PE also showed a moderate to large pericardial effusion.  Echo showed no definite tamponade, but patient had pericardial window 4/17 for diagnostic and therapeutic purposes.   GIB - ENDO with small gastric ulcer. OK to resume Xarelto.    1. Atrial fibrillation: Paroxsymal and symptomatic.This may be secondary to PE or pericardial effusion or general inflammatory process .  Now back in sinus bradycardia.   - Xarelto now resumed, ok with GI. Changed dosage. 20mg  QD. Stopped Xarelto previously 4/21, GIB. - Continue low dose metoprolol.  - Address AFIB with RVR if returns. OK with dilt IV as tolerated.   2. Pericardial effusion: Bloody effusion. Etiology is uncertain. Pericardial effusion initially noted on CTA when PE was detected.  Possible prior pericarditis (severe presumed viral infection several months ago), ?worsened by anticoagulation for PE.  Pericardial window 4/18 for diagnosis and treatment.  Gram stain negative, pathology negative. He is on colchicine, continue x 3 months (until July).   -Pericardial drain DC'd 4/20. Per TCTS team. Note reviewed.    3.  Pulmonary embolus: Again, etiology uncertain.  Had surgery but back in 1/15.  Hypercoagulable workup negative.  Back on Xarelto held previously with GIB. Thankfully, small clot burden. No DVT on LE Dopplers 4/16.  Will go ahead on place on Xarelto 20mg  instead of 15 mg BID because of recent bleed.   4. CHF: Acute diastolic CHF.  Lasix to po 20mg .   5. GIB. Endoscopy - small gastric ulcer. He noted some dried blood in his stool. Continue to monitor closely. Hemoglobin relatively stable at 7.8  Awaiting transfer to floor.   Elta Guadeloupe [facility] 07/24/2013 12:04 PM

## 2013-07-24 NOTE — Progress Notes (Signed)
RT setup CPAP with patients home mask and tubing on home setting of 16 cmH2O.  Patient is tolerating CPAP at this time. RT will continue to monitor.

## 2013-07-25 ENCOUNTER — Other Ambulatory Visit: Payer: Self-pay | Admitting: Physician Assistant

## 2013-07-25 DIAGNOSIS — D62 Acute posthemorrhagic anemia: Secondary | ICD-10-CM

## 2013-07-25 DIAGNOSIS — I313 Pericardial effusion (noninflammatory): Secondary | ICD-10-CM

## 2013-07-25 DIAGNOSIS — K759 Inflammatory liver disease, unspecified: Secondary | ICD-10-CM

## 2013-07-25 DIAGNOSIS — I509 Heart failure, unspecified: Secondary | ICD-10-CM

## 2013-07-25 DIAGNOSIS — I4891 Unspecified atrial fibrillation: Secondary | ICD-10-CM

## 2013-07-25 DIAGNOSIS — G4730 Sleep apnea, unspecified: Secondary | ICD-10-CM

## 2013-07-25 DIAGNOSIS — I319 Disease of pericardium, unspecified: Secondary | ICD-10-CM

## 2013-07-25 DIAGNOSIS — K7689 Other specified diseases of liver: Secondary | ICD-10-CM

## 2013-07-25 DIAGNOSIS — K254 Chronic or unspecified gastric ulcer with hemorrhage: Secondary | ICD-10-CM

## 2013-07-25 DIAGNOSIS — Z87442 Personal history of urinary calculi: Secondary | ICD-10-CM

## 2013-07-25 DIAGNOSIS — I3139 Other pericardial effusion (noninflammatory): Secondary | ICD-10-CM

## 2013-07-25 LAB — BASIC METABOLIC PANEL
BUN: 20 mg/dL (ref 6–23)
CHLORIDE: 105 meq/L (ref 96–112)
CO2: 27 mEq/L (ref 19–32)
Calcium: 8.3 mg/dL — ABNORMAL LOW (ref 8.4–10.5)
Creatinine, Ser: 1.11 mg/dL (ref 0.50–1.35)
GFR calc Af Amer: 77 mL/min — ABNORMAL LOW (ref >90)
GFR calc non Af Amer: 67 mL/min — ABNORMAL LOW (ref >90)
Glucose, Bld: 105 mg/dL — ABNORMAL HIGH (ref 70–99)
POTASSIUM: 3.5 meq/L — AB (ref 3.7–5.3)
Sodium: 144 mEq/L (ref 137–147)

## 2013-07-25 LAB — CBC
HCT: 26.5 % — ABNORMAL LOW (ref 39.0–52.0)
Hemoglobin: 8.6 g/dL — ABNORMAL LOW (ref 13.0–17.0)
MCH: 29.5 pg (ref 26.0–34.0)
MCHC: 32.5 g/dL (ref 30.0–36.0)
MCV: 90.8 fL (ref 78.0–100.0)
PLATELETS: 405 10*3/uL — AB (ref 150–400)
RBC: 2.92 MIL/uL — ABNORMAL LOW (ref 4.22–5.81)
RDW: 16.8 % — ABNORMAL HIGH (ref 11.5–15.5)
WBC: 13 10*3/uL — AB (ref 4.0–10.5)

## 2013-07-25 MED ORDER — RIVAROXABAN 20 MG PO TABS: 20.0000 mg | ORAL_TABLET | Freq: Every day | ORAL | Status: DC

## 2013-07-25 MED ORDER — POTASSIUM CHLORIDE CRYS ER 20 MEQ PO TBCR
40.0000 meq | EXTENDED_RELEASE_TABLET | Freq: Once | ORAL | Status: AC
  Administered 2013-07-25: 40 meq via ORAL
  Filled 2013-07-25: qty 2

## 2013-07-25 MED ORDER — DSS 100 MG PO CAPS: 100.0000 mg | ORAL_CAPSULE | Freq: Two times a day (BID) | ORAL | Status: DC

## 2013-07-25 MED ORDER — FUROSEMIDE 20 MG PO TABS: 20.0000 mg | ORAL_TABLET | Freq: Every day | ORAL | Status: DC

## 2013-07-25 MED ORDER — POLYETHYLENE GLYCOL 3350 17 G PO PACK: 17.0000 g | PACK | Freq: Every day | ORAL | Status: DC

## 2013-07-25 MED ORDER — COLCHICINE 0.6 MG PO TABS: 0.6000 mg | ORAL_TABLET | Freq: Two times a day (BID) | ORAL | Status: DC

## 2013-07-25 MED ORDER — METOPROLOL TARTRATE 25 MG PO TABS: 25.0000 mg | ORAL_TABLET | Freq: Two times a day (BID) | ORAL | Status: DC

## 2013-07-25 MED ORDER — PANTOPRAZOLE SODIUM 40 MG PO TBEC: 40.0000 mg | DELAYED_RELEASE_TABLET | Freq: Two times a day (BID) | ORAL | Status: DC

## 2013-07-25 NOTE — Evaluation (Signed)
Physical Therapy Evaluation and Discharge Patient Details Name: [name] MRN: [id number] DOB: 07-09-1945 Today's Date: 07/25/2013   History of Present Illness  Pt is a 68 y/o male admitted with CHF and pericardial effusion with concern for PE.  Clinical Impression  Patient evaluated by Physical Therapy with no further acute PT needs identified. All education has been completed and the patient has no further questions. Pt requesting to begin cardiac rehab at d/c. See below for any follow-up Physial Therapy or equipment needs. PT is signing off. Thank you for this referral.     Follow Up Recommendations No PT follow up;Other (comment) (Cardiac rehab)    Equipment Recommendations  None recommended by PT    Recommendations for Other Services       Precautions / Restrictions Precautions Precautions: Fall Restrictions Weight Bearing Restrictions: No      Mobility  Bed Mobility Overal bed mobility: Modified Independent             General bed mobility comments: Pt required no assist to transition to full seated position. Bed rails used for minimal support.  Transfers Overall transfer level: Modified independent Equipment used: None Transfers: Sit to/from Stand           General transfer comment: Pt demonstrated proper hand placement and safety awareness with transfers.  Ambulation/Gait Ambulation/Gait assistance: Supervision Ambulation Distance (Feet): 300 Feet Assistive device: None Gait Pattern/deviations: WFL(Within Functional Limits) Gait velocity: Somewhat decreased Gait velocity interpretation: Below normal speed for age/gender General Gait Details: No unsteadiness or LOB noted with ambulation. Pt able to maintain upright position independently.  Stairs            Wheelchair Mobility    Modified Rankin (Stroke Patients Only)       Balance Overall balance assessment: No apparent balance deficits (not formally assessed)                                            Pertinent Vitals/Pain Pt with no complaints of SOB during ambulation.    Home Living Family/patient expects to be discharged to:: Private residence Living Arrangements: Spouse/significant other Available Help at Discharge: Family;Available 24 hours/day Type of Home: House Home Access: Stairs to enter Entrance Stairs-Rails: Psychiatric nurse of Steps: 8 Home Layout: One level Home Equipment: Walker - 2 wheels;Cane - quad      Prior Function Level of Independence: Independent               Hand Dominance   Dominant Hand: Right    Extremity/Trunk Assessment   Upper Extremity Assessment: Overall WFL for tasks assessed           Lower Extremity Assessment: Overall WFL for tasks assessed      Cervical / Trunk Assessment: Normal  Communication   Communication: No difficulties  Cognition Arousal/Alertness: Awake/alert Behavior During Therapy: WFL for tasks assessed/performed Overall Cognitive Status: Within Functional Limits for tasks assessed                      General Comments      Exercises        Assessment/Plan    PT Assessment Patent does not need any further PT services  PT Diagnosis     PT Problem List    PT Treatment Interventions     PT Goals (Current goals can be found in  the Care Plan section) Acute Rehab PT Goals Patient Stated Goal: To start exercising with cardiac rehab PT Goal Formulation: No goals set, d/c therapy    Frequency     Barriers to discharge        Co-evaluation               End of Session Equipment Utilized During Treatment: Gait belt Activity Tolerance: Patient tolerated treatment well Patient left: in chair;with call bell/phone within reach Nurse Communication: Mobility status         Time: 1030-1053 PT Time Calculation (min): 23 min   Charges:   PT Evaluation $Initial PT Evaluation Tier I: 1 Procedure PT Treatments $Gait  Training: 8-22 mins   PT G CodesJolyn Lent 07/25/2013, 1:25 PM  Jolyn Lent, PT, DPT Acute Rehabilitation Services Pager: [phone]

## 2013-07-25 NOTE — Progress Notes (Signed)
Subjective: Feels a lot better.  Objective: Vital signs in last 24 hours: Temp:  [98 F (36.7 C)-98.5 F (36.9 C)] 98.3 F (36.8 C) (04/24 0556) Pulse Rate:  [55-75] 75 (04/24 0556) Resp:  [16-20] 20 (04/24 0556) BP: (99-113)/(49-61) 111/57 mmHg (04/24 0556) SpO2:  [95 %-97 %] 95 % (04/24 0556) Weight:  [291 lb 1.6 oz (132.042 kg)-292 lb 3.2 oz (132.541 kg)] 291 lb 1.6 oz (132.042 kg) (04/24 0556) Last BM Date: 07/24/13  Intake/Output from previous day: 04/23 0701 - 04/24 0700 In: 1180 [P.O.:1180] Out: 1400 [Urine:1400] Intake/Output this shift: Total I/O In: 360 [P.O.:360] Out: -   Medications Current Facility-Administered Medications  Medication Dose Route Frequency Provider Last Rate Last Dose  . 0.9 %  sodium chloride infusion   Intravenous Continuous Hosie Poisson, MD      . calcium carbonate (TUMS - dosed in mg elemental calcium) chewable tablet 250 mg  250 mg Oral TID WC Orvan Falconer, MD   250 mg at 07/25/13 0931  . colchicine tablet 0.6 mg  0.6 mg Oral BID Hosie Poisson, MD   0.6 mg at 07/25/13 0932  . docusate sodium (COLACE) capsule 100 mg  100 mg Oral BID Kelvin Cellar, MD   100 mg at 07/25/13 0933  . fentaNYL (SUBLIMAZE) injection 25-50 mcg  25-50 mcg Intravenous Q2H PRN Evelene Croon Barrett, PA-C   50 mcg at 07/19/13 1432  . finasteride (PROSCAR) tablet 5 mg  5 mg Oral Daily Orvan Falconer, MD   5 mg at 07/25/13 0932  . furosemide (LASIX) tablet 20 mg  20 mg Oral Daily Larey Dresser, MD   20 mg at 07/25/13 0933  . gabapentin (NEURONTIN) capsule 100 mg  100 mg Oral BID Orvan Falconer, MD   100 mg at 07/25/13 0932  . LORazepam (ATIVAN) tablet 0.5 mg  0.5 mg Oral Daily Orvan Falconer, MD   0.5 mg at 07/25/13 2585  . LORazepam (ATIVAN) tablet 1 mg  1 mg Oral QHS Orvan Falconer, MD   1 mg at 07/24/13 2226  . metoprolol tartrate (LOPRESSOR) tablet 25 mg  25 mg Oral BID Doreene Burke Kilroy, PA-C   25 mg at 07/25/13 0932  . multivitamins with iron tablet 1 tablet  1 tablet Oral Daily Hosie Poisson, MD    1 tablet at 07/25/13 0932  . ondansetron (ZOFRAN) tablet 4 mg  4 mg Oral Q6H PRN Orvan Falconer, MD       Or  . ondansetron [facility]) injection 4 mg  4 mg Intravenous Q6H PRN Orvan Falconer, MD      . pantoprazole (PROTONIX) EC tablet 40 mg  40 mg Oral BID Kelvin Cellar, MD   40 mg at 07/25/13 0933  . polyethylene glycol (MIRALAX / GLYCOLAX) packet 17 g  17 g Oral Daily Kelvin Cellar, MD   17 g at 07/25/13 0933  . risperiDONE (RISPERDAL) tablet 0.5 mg  0.5 mg Oral QHS Orvan Falconer, MD   0.5 mg at 07/24/13 2226  . rivaroxaban (XARELTO) tablet 20 mg  20 mg Oral Q supper Candee Furbish, MD   20 mg at 07/24/13 1741  . sertraline (ZOLOFT) tablet 150 mg  150 mg Oral Q breakfast Orvan Falconer, MD   150 mg at 07/25/13 0932  . sodium chloride 0.9 % injection 3 mL  3 mL Intravenous PRN Grace Isaac, MD      . vitamin B-12 (CYANOCOBALAMIN) tablet 1,000 mcg  1,000 mcg Oral Daily Orvan Falconer, MD  1,000 mcg at 07/25/13 0932    PE: General appearance: alert, cooperative and no distress Lungs: clear to auscultation bilaterally Heart: regular rate and rhythm, S1, S2 normal, no murmur, click, rub or gallop Abdomen: +BS, NONtender Extremities: No LEE Pulses: 2+ and symmetric Skin: Warm, dry, pale Neurologic: Grossly normal  Lab Results:   Recent Labs  07/23/13 2030 07/24/13 0255 07/25/13 0553  WBC  --   --  13.0*  HGB 7.8* 7.7* 8.6*  HCT 23.6* 23.5* 26.5*  PLT  --   --  405*   BMET  Recent Labs  07/24/13 0255 07/25/13 0553  NA 143 144  K 4.2 3.5*  CL 108 105  CO2 24 27  GLUCOSE 94 105*  BUN 30* 20  CREATININE 1.16 1.11  CALCIUM 7.8* 8.3*      Assessment/Plan  Principal Problem:   CHF (congestive heart failure) Active Problems:   HYPERLIPIDEMIA   HTN (hypertension)   SLEEP APNEA   Morbid obesity   Morbid obesity with BMI of 45.0-49.9, adult   H/O laparoscopic adjustable gastric banding & hiatal hernia repair 04/08/13   Pericardial effusion   Pericarditis   Post viral syndrome   Pulmonary  embolism   Hepatitis   Atrial fibrillation   Pulmonary embolus   Melena   Acute posthemorrhagic anemia   Chronic anticoagulation   Gastric ulcer with hemorrhage  Plan:   1.  Atrial fibrillation:  Paroxsymal and symptomatic. Maintaining SR on tele. Back on Xarelto 20mg  QD. low dose metoprolol.    2. Pericardial effusion: Bloody effusion.  Pericardial window 4/18 for diagnosis and treatment.  Gram stain negative, pathology negative. He is on colchicine, continue x 3 months (until July).  Pericardial drain DC'd 4/20. Per [facility] team.   Will get limited echo in a couple weeks prior to office follow up.  3. Pulmonary embolus: Back on Xarelto. No DVT on LE Dopplers 4/16.   4. CHF: Acute diastolic CHF.  Lasix to po 20mg .  Net fluids: -0.2L/-8.3L   5. GIB. Endoscopy  Small gastric ulcer. Hemoglobin increased to 8.6.  Needs follow H&H.  6.  Hypokalemia  Replace  7.  Disposition  OK to DC home   LOS: 9 days    Tarri Fuller PA-C 07/25/2013 11:01 AM  I have seen and examined the patient along with Tarri Fuller PA-C.  I have reviewed the chart, notes and new data.  I agree with PA's note.  Medical illness has had some confusing features, but it appears most likely he had a protracted viral illness complicated by pericarditis and small pulmonary embolism.  He feels greatly improved and walked without difficulty or dyspnea. No indication of active bleeding on Xarelto. DC with f/u limited echo and early clinical reevaluation, Xarelto for 9-12 months, colchicine for 2-3 months, MVI and Fe supplements.   Sanda Klein, MD, [place] [phone] 07/25/2013, 12:46 PM

## 2013-07-25 NOTE — Discharge Summary (Signed)
Physician Discharge Summary  [name] [id number] DOB: 17-Feb-1946 DOA: 07/16/2013  PCP: [name], MD  Admit date: 07/16/2013 Discharge date: 07/25/2013  Time spent: 35 minutes  Recommendations for Outpatient Follow-up:  1. Please follow up on repeat H&H in 1 week, patient having a GI bleed during this hospitalization, being discharged on Xarelto for treatment of PE 2. Will need a limited echo in 2 weeks to follow up on pericardial effusion 3. Repeat CMP in 1 week to follow up on elevated transaminases  Discharge Diagnoses:  Principal Problem:   CHF (congestive heart failure) Active Problems:   HYPERLIPIDEMIA   HTN (hypertension)   SLEEP APNEA   Morbid obesity   Morbid obesity with BMI of 45.0-49.9, adult   H/O laparoscopic adjustable gastric banding & hiatal hernia repair 04/08/13   Pericardial effusion   Pericarditis   Post viral syndrome   Pulmonary embolism   Hepatitis   Atrial fibrillation   Pulmonary embolus   Melena   Acute posthemorrhagic anemia   Chronic anticoagulation   Gastric ulcer with hemorrhage   Discharge Condition: Stable  Diet recommendation: Heart Healthy  Filed Weights   07/22/13 0530 07/24/13 1312 07/25/13 0556  Weight: 133.7 kg (294 lb 12.1 oz) 132.541 kg (292 lb 3.2 oz) 132.042 kg (291 lb 1.6 oz)    History of present illness:  [name] is an 68 y.o. male with hx of bariatric surgery one year ago, lost about 85 lbs total before and after surgery, hx of HTN, obesity and sleep apnea, gout, depression, BPH, noted that he had a severe "viral illness" about 3 months ago, followed by a persistent but no productive cough, wheezing and shortness of breath. He was seen at the urgent care, Dx with URI, and given symptomatic Tx. He saw his PCP, who was concerned about a PE, and thus started him on Xarelto and Lasix, and he presented to the ER today with same symptoms. He said his chest pain was with movement, better with lying down and  with palpation. He has no orthopnea or PND. He denied fever, chills, nausea, vomiting or diaphoresis. Evaluation in the ER showed EKG with no ST elevation, in NSR, with S1Q3 and reverse T wave in the precordial leads. A CTPA was done, and it showed a small clot burden acute pulmonary edema, with pericardial effusion along with bilateral pleural effusion. He denied rash, joint pain, family hx of rhematoid arthritis, or lupus. [facility] was a[facility]ed to admit him for PE and pericarditis.   Hospital Course:  [name] is an 68 y.o. male with hx of bariatric surgery one year ago, lost about 85 lbs total before and after surgery, hx of HTN, obesity and sleep apnea, gout, depression, BPH, noted that he had a severe "viral illness" about 3 months ago, followed by a persistent but no productive cough, wheezing and shortness of breath. He was seen at the urgent care, Dx with URI, and given symptomatic Tx. He saw his PCP, who was concerned about a PE, and thus started him on Xarelto and Lasix, and he presented to the ER today with same symptoms. He said his chest pain was with movement, better with lying down and with palpation. He has no orthopnea or PND. He denied fever, chills, nausea, vomiting or diaphoresis. Evaluation in the ER showed EKG with no ST elevation, in NSR, with S1Q3 and reverse T wave in the precordial leads. A CTPA was done, and it showed a small clot burden acute  pulmonary edema, with pericardial effusion along with bilateral pleural effusion. He denied rash, joint pain, family hx of rhematoid arthritis, or lupus. Hospit[facility] was asked to admit him for PE and pericarditis. Echocardiogram was done, showed moderate to large pericardial effusion, then cardiology was consulted and he was put on IV lasix. Meanwhile the night of 4/16 he went in to afib with RVR, probably secondary to PE and was transferred to step down and started on cardizem drip. He converted to sinus the same night , cardizem  discontinued and he was resumed on metoprolol. CT surgery was consulted for pericardial window. He underwent pericardial window on 4/17 and the hemorrhagic fluid was sent for analysis. He was also started on colchicine for his pericarditis. So far his gram stain and cultures have been negative. Drainage from the chest tube minimal at this time and his chest tube was discontinued on 4/20. The evening of 4/20 pt went back in to afib with RVR and had bloody bowel movement. His xarelto was discontinued and he was put on cardizem gtt. Over the next 24 hours his hemoglobin dropped to 7.8 and he received 2 units of prbc transfusion. His repeat H&H is pending. Dr Henrene Pastor was consulted from GI on 4/20 and he underwent endoscopy on 4/21 , . He was found to have superficial ulcers in the stomach, underwent biopsy of the area. He was recommended to continue on PPI twice daily for 2 weeks and resume xarelto. Patient showing clinical improvement as his Hg remained stable (Hg 8.6 on day of discharge). He was cleared by cardiology for discharge. He will need a limited echo to be done in 2 weeks to follow up on pericardial effusion. He was discharged in stable condition on 07/25/13 to his home.   Procedures: Echocardiogram  Pericardial window on 4/17  Consultations:  Gastroenterology  Cardiology  Physical therapy  Discharge Exam: Filed Vitals:   07/25/13 0556  BP: 111/57  Pulse: 75  Temp: 98.3 F (36.8 C)  Resp: 20    General: Patient appears well, in no acute distress, feels ready to go home Cardiovascular: RRR, normal S1S2 Respiratory: Clear to auscultation bilaterally Abdomen: Soft, nontender, nondistended  Discharge Instructions You were cared for by a hospitalist during your hospital stay. If you have any questions about your discharge medications or the care you received while you were in the hospital after you are discharged, you can call the unit and asked to speak with the hospitalist on call if  the hospitalist that took care of you is not available. Once you are discharged, your primary care physician will handle any further medical issues. Please note that NO REFILLS for any discharge medications will be authorized once you are discharged, as it is imperative that you return to your primary care physician (or establish a relationship with a primary care physician if you do not have one) for your aftercare needs so that they can reassess your need for medications and monitor your lab values.  Discharge Orders   Future Appointments Provider Department Dept Phone   08/07/2013 10:00 AM [facility] [place]   08/14/2013 12:15 PM Grace Isaac, MD [facility] [phone]   10/08/2013 11:00 AM Lady Saucier, RD [facility] [phone]   Future Orders Complete By Expires   Call MD for:  difficulty breathing, headache or visual disturbances  As directed    Call MD for:  extreme  fatigue  As directed    Call MD for:  persistant dizziness or light-headedness  As directed    Call MD for:  persistant nausea and vomiting  As directed    Call MD for:  severe uncontrolled pain  As directed    Diet - low sodium heart healthy  As directed    Increase activity slowly  As directed        Medication List    STOP taking these medications       amLODipine-benazepril 5-10 MG per capsule  Commonly known as:  LOTREL     aspirin 325 MG tablet     calcium gluconate 500 MG tablet     Flax Seed Oil 1300 MG Caps     IMODIUM MULTI-SYMPTOM RELIEF 2-125 MG Tabs  Generic drug:  Loperamide-Simethicone     ondansetron 4 MG tablet  Commonly known as:  ZOFRAN     pravastatin 40 MG tablet  Commonly known as:  PRAVACHOL     traMADol 50 MG tablet  Commonly known as:  ULTRAM      TAKE these medications       allopurinol 300 MG tablet  Commonly known as:  ZYLOPRIM  Take  300 mg by mouth daily with breakfast.     colchicine 0.6 MG tablet  Take 1 tablet (0.6 mg total) by mouth 2 (two) times daily.     DSS 100 MG Caps  Take 100 mg by mouth 2 (two) times daily.     finasteride 5 MG tablet  Commonly known as:  PROSCAR  Take 5 mg by mouth daily.     furosemide 20 MG tablet  Commonly known as:  LASIX  Take 1 tablet (20 mg total) by mouth daily.     gabapentin 100 MG capsule  Commonly known as:  NEURONTIN  Take 1 capsule (100 mg total) by mouth 2 (two) times daily.     GLUCOSAMINE 1500 COMPLEX PO  Take 1,500 mg by mouth daily.     LORazepam 0.5 MG tablet  Commonly known as:  ATIVAN  Take 0.5-1 mg by mouth 2 (two) times daily. 0.5 mg in the morning and 1 mg at bedtime     metoprolol tartrate 25 MG tablet  Commonly known as:  LOPRESSOR  Take 1 tablet (25 mg total) by mouth 2 (two) times daily.     multivitamin tablet  Take 1 tablet by mouth daily. Chewable tablet with iron     pantoprazole 40 MG tablet  Commonly known as:  PROTONIX  Take 1 tablet (40 mg total) by mouth 2 (two) times daily.     polyethylene glycol packet  Commonly known as:  MIRALAX / GLYCOLAX  Take 17 g by mouth daily.     risperiDONE 0.5 MG tablet  Commonly known as:  RISPERDAL  Take 1 tablet (0.5 mg total) by mouth at bedtime.     rivaroxaban 20 MG Tabs tablet  Commonly known as:  XARELTO  Take 1 tablet (20 mg total) by mouth daily with supper.     sertraline 100 MG tablet  Commonly known as:  ZOLOFT  Take 1.5 tablets (150 mg total) by mouth daily with breakfast.     VISINE OP  Place 2 drops into both eyes daily as needed (dry eyes).     Vitamin B-12 1000 MCG Subl  Place 1,000 mcg under the tongue every morning.       Allergies  Allergen Reactions  . Propoxyphene N-Acetaminophen Nausea  Only  . Codeine     nausea  . Morphine     Nausea Severe hallucinations  . Penicillins     LOC with shot       Follow-up Information   Follow up with Grace Isaac, MD On 08/14/2013. (PA/LAT CXR to be taken (at [facility] which is in the same building as Dr. Forde Dandy) on 08/14/2013 at 11:15 am;Appointment time is at 12:15 pm)    Specialty:  Cardiothoracic Surgery   Contact information:   [address] [place] [address] [phone]       Follow up with [name], MD In 1 week.   Specialty:  Internal Medicine   Contact information:   [address] [address] [phone]       Follow up with Candee Furbish, MD In 2 weeks.   Specialty:  Cardiology   Contact information:   [id number] [place] [address] [phone]       Follow up with Scarlette Shorts, MD In 2 weeks.   Specialty:  Gastroenterology   Contact information:   [address] [address] [phone]        The results of significant diagnostics from this hospitalization (including imaging, microbiology, ancillary and laboratory) are listed below for reference.    Significant Diagnostic Studies: Dg Chest 2 View  07/15/2013   CLINICAL DATA:  Dyspnea and chest pain.  EXAM: CHEST  2 VIEW  COMPARISON:  07/13/2013 and 09/26/2012  FINDINGS: Lungs are adequately inflated with continued evidence of a small left pleural effusion with possible slight interval improvement. There is mild stable cardiomegaly. Remainder the exam is unchanged.  IMPRESSION: Continued small left pleural effusion with possible slight interval improvement.   Electronically Signed   By: Marin Olp M.D.   On: 07/15/2013 11:32   Dg Chest 2 View  07/13/2013   CLINICAL DATA:  Weakness and chest pain.  EXAM: CHEST - 2 VIEW  COMPARISON:  DG CHEST 2 VIEW dated 09/26/2012; DG THORACIC SPINE dated 01/19/2011  FINDINGS: The heart is mildly enlarged. Mild interstitial and pulmonary vascular prominence identified without evidence of overt edema. There is a small left pleural effusion. Bony structures show stable mild degenerative disease of the  thoracic spine.  IMPRESSION: Cardiomegaly and pulmonary interstitial prominence with small left pleural effusion. No overt edema is identified   Electronically Signed   By: Aletta Edouard M.D.   On: 07/13/2013 18:16   Ct Angio Chest Pe W/cm &/or Wo Cm  07/16/2013   CLINICAL DATA:  Shortness of breath, neck/back pain, elevated D-dimer, evaluate for PE. Lap band surgery in January.  EXAM: CT ANGIOGRAPHY CHEST WITH CONTRAST  TECHNIQUE: Multidetector CT imaging of the chest was performed using the standard protocol during bolus administration of intravenous contrast. Multiplanar CT image reconstructions and MIPs were obtained to evaluate the vascular anatomy.  CONTRAST:  41mL OMNIPAQUE IOHEXOL 350 MG/ML SOLN  COMPARISON:  None.  FINDINGS: Tiny subsegmental filling defects within branches of the right lower lobe pulmonary artery (series 5/ images 169, 170, and 189), suspicious for pulmonary emboli. Overall clot burden is small. No findings to suggest right heart strain (RV-to-LV ratio 0.87).  Small to moderate left and small right pleural effusions. No frank interstitial edema. No pneumothorax.  Visualized right thyroid is notable for a suspected 2.2 cm thyroid nodule (series 4/ image 1), incompletely visualized.  The heart is top-normal in size. Small to moderate  pericardial effusion with a mildly irregular appearance. Mild atherosclerotic calcifications of the aortic arch.  No suspicious mediastinal, hilar, or axillary lymphadenopathy.  Visualized upper abdomen is notable for a laparoscopic band in satisfactory position.  Degenerative changes of the visualized thoracolumbar spine.  Review of the MIP images confirms the above findings.  IMPRESSION: Suspected tiny subsegmental pulmonary emboli within branches of the right lower lobe pulmonary artery. Overall clot burden is small.  Small to moderate left and small right pleural effusions. No frank interstitial edema.  Small to moderate pericardial effusion. Given the  irregular appearance, correlate for pericarditis.  Critical Value/emergent results were called by telephone at the time of interpretation on 07/16/2013 at 5:28 PM to Dr. Gwyndolyn Saxon HOPPER, who verbally acknowledged these results.   Electronically Signed   By: Julian Hy M.D.   On: 07/16/2013 17:29   US Abdomen Limited  07/13/2013   CLINICAL DATA:  Increased liver function tests, right upper quadrant pain  EXAM: US ABDOMEN LIMITED - RIGHT UPPER QUADRANT  COMPARISON:  None.  FINDINGS: Gallbladder:  Surgically at  Common bile duct:  Diameter: 7 mm  Liver:  No focal abnormalities.  IMPRESSION: No acute abnormalities   Electronically Signed   By: Skipper Cliche M.D.   On: 07/13/2013 20:37   Dg Chest Port 1 View  07/22/2013   CLINICAL DATA:  Pericardial effusion.  Pericardial window.  EXAM: PORTABLE CHEST - 1 VIEW  COMPARISON:  DG CHEST 1V PORT dated 07/21/2013  FINDINGS: Right IJ line in stable position. Stable cardiomegaly. No pulmonary venous distention. Persistent mild left base subsegmental atelectasis remains. Mild elevation left hemidiaphragm. No prominent pleural effusion or pneumothorax noted. No acute osseous abnormality.  IMPRESSION: 1. Right IJ line in stable position. 2. Stable cardiomegaly. 3. Persistent left base subsegmental atelectasis.   Electronically Signed   By: Marcello Moores  Register   On: 07/22/2013 07:22   Dg Chest Port 1 View  07/21/2013   CLINICAL DATA:  Pericardial window.  EXAM: PORTABLE CHEST - 1 VIEW  COMPARISON:  DG CHEST PORT 1VSAME DAY dated 07/20/2013; CT ANGIO CHEST W/CM &/OR WO/CM dated 07/16/2013  FINDINGS: Mediastinum hilar structures are normal. Right IJ line in stable position. Stable cardiomegaly and pulmonary vascularity. Mild left base subsegmental atelectasis. No pneumothorax.  IMPRESSION: 1. Right IJ line in stable position. 2. Stable cardiomegaly, no CHF. 3. Mild left lung base atelectasis   Electronically Signed   By: [place]   On: 07/21/2013 10:30   Dg Chest  Port 1 View  07/18/2013   CLINICAL DATA:  Cough.  Status post pericardial window procedure.  EXAM: PORTABLE CHEST - 1 VIEW  COMPARISON:  CT ANGIO CHEST W/CM &/OR WO/CM dated 07/16/2013; DG CHEST 2 VIEW dated 07/15/2013  FINDINGS: Right IJ line tip: Upper SVC. Moderately enlarged cardiopericardial silhouette. Indistinct pulmonary vasculature compatible with pulmonary venous hypertension.  Right costophrenic angle excluded. No pneumothorax or significant pneumomediastinum observed. Retrocardiac density suggests mild atelectasis in the left lower lobe.  IMPRESSION: 1. Continued enlargement of the cardiopericardial silhouette, with underlying pulmonary venous hypertension. 2. Mild atelectasis in the left lower lobe. 3. Right IJ line tip:  Upper SVC.   Electronically Signed   By: Sherryl Barters M.D.   On: 07/18/2013 15:01   Dg Chest Port 1v Same Day  07/20/2013   CLINICAL DATA:  68 year old male status post pericardial window for effusion. Initial encounter.  EXAM: PORTABLE CHEST - 1 VIEW SAME DAY  COMPARISON:  07/18/2013 and earlier.  FINDINGS: Portable  AP upright view at 0722 hr. Stable right IJ central line. Stable cardiac size and mediastinal contours. No pneumothorax or edema. Continued retrocardiac opacity but low the less veiling opacity at the lung bases.  IMPRESSION: 1. Stable cardiac silhouette since 07/18/2013. 2. Suspect regressed small pleural effusions but continued lower lobe collapse or consolidation.   Electronically Signed   By: Lars Pinks M.D.   On: 07/20/2013 13:28    Microbiology: Recent Results (from the past 240 hour(s))  MRSA PCR SCREENING     Status: None   Collection Time    07/18/13  4:03 AM      Result Value Ref Range Status   MRSA by PCR NEGATIVE  NEGATIVE Final   Comment:            The GeneXpert MRSA Assay (FDA     approved for NASAL specimens     only), is one component of a     comprehensive MRSA colonization     surveillance program. It is not     intended to diagnose  MRSA     infection nor to guide or     monitor treatment for     MRSA infections.  BODY FLUID CULTURE     Status: None   Collection Time    07/18/13 12:43 PM      Result Value Ref Range Status   Specimen Description FLUID PERICARDIAL   Final   Special Requests PT ON ZINACEF 1.5    Final   Gram Stain     Final   Value: RARE WBC PRESENT,BOTH PMN AND MONONUCLEAR     NO ORGANISMS SEEN     TECH REVIEW     Performed at [facility]   Culture     Final   Value: NO GROWTH 4 DAYS     Performed at [facility]   Report Status 07/22/2013 FINAL   Final  FUNGUS CULTURE W SMEAR     Status: None   Collection Time    07/18/13 12:43 PM      Result Value Ref Range Status   Specimen Description FLUID PERICARDIAL   Final   Special Requests PT ON ZINACEF 1.5   Final   Fungal Smear     Final   Value: NO YEAST OR FUNGAL ELEMENTS SEEN     Performed at [facility]   Culture     Final   Value: CULTURE IN PROGRESS FOR FOUR WEEKS     Performed at [facility]   Report Status PENDING   Incomplete  ANAEROBIC CULTURE     Status: None   Collection Time    07/18/13 12:43 PM      Result Value Ref Range Status   Specimen Description FLUID PERICARDIAL   Final   Special Requests PT ON ZINACEF 1.5   Final   Gram Stain     Final   Value: RARE WBC PRESENT,BOTH PMN AND MONONUCLEAR     NO ORGANISMS SEEN     Performed at [facility]   Culture     Final   Value: NO ANAEROBES ISOLATED     Performed at [facility]   Report Status 07/23/2013 FINAL   Final  GRAM STAIN     Status: None   Collection Time    07/18/13 12:43 PM      Result Value Ref Range Status   Specimen Description FLUID PERICARDIAL   Final   Special Requests PT  ON ZINACEF 1.5   Final   Gram Stain     Final   Value: RARE WBC PRESENT,BOTH PMN AND MONONUCLEAR     NO ORGANISMS SEEN   Report Status 07/18/2013 FINAL   Final  BODY FLUID CULTURE     Status: None   Collection Time    07/18/13 12:43  PM      Result Value Ref Range Status   Specimen Description FLUID PERICARDIAL   Final   Special Requests FLUID ON SWAB PATIEN ON FOLLOWING ZINACEF 1.5   Final   Gram Stain     Final   Value: RARE WBC PRESENT,BOTH PMN AND MONONUCLEAR     NO ORGANISMS SEEN     Performed at [facility]   Culture     Final   Value: NO GROWTH 3 DAYS     Performed at [facility]   Report Status 07/21/2013 FINAL   Final  FUNGUS CULTURE W SMEAR     Status: None   Collection Time    07/18/13 12:43 PM      Result Value Ref Range Status   Specimen Description FLUID PERICARDIAL   Final   Special Requests PT ON ZINACEF 1.5 FLUID ON SWAB   Final   Fungal Smear     Final   Value: NO YEAST OR FUNGAL ELEMENTS SEEN     Performed at [facility]   Culture     Final   Value: CULTURE IN PROGRESS FOR FOUR WEEKS     Performed at [facility]   Report Status PENDING   Incomplete  AFB CULTURE WITH SMEAR     Status: None   Collection Time    07/18/13 12:43 PM      Result Value Ref Range Status   Specimen Description FLUID PERICARDIAL   Final   Special Requests PT ON ZINACEF 1.5 FLUID ON SWAB   Final   Acid Fast Smear     Final   Value: NO ACID FAST BACILLI SEEN     Performed at [facility]   Culture     Final   Value: CULTURE WILL BE EXAMINED FOR 6 WEEKS BEFORE ISSUING A FINAL REPORT     Performed at [facility]   Report Status PENDING   Incomplete  ANAEROBIC CULTURE     Status: None   Collection Time    07/18/13 12:43 PM      Result Value Ref Range Status   Specimen Description FLUID PERICARDIAL   Final   Special Requests FLUID ON SWAB   Final   Gram Stain     Final   Value: RARE WBC PRESENT,BOTH PMN AND MONONUCLEAR     NO ORGANISMS SEEN     Performed at [facility]   Culture     Final   Value: NO ANAEROBES ISOLATED     Performed at [facility]   Report Status 07/23/2013 FINAL   Final  VIRAL CULTURE VIRC     Status: None   Collection  Time    07/18/13 12:43 PM      Result Value Ref Range Status   Specimen Description FLUID PERICARDIAL   Final   Special Requests NONE   Final   Culture     Final   Value: CONTINUING TO HOLD     Note:    The current Enterovirus culture system does not detect Enterovirus D68. If Enterovirus D68 is suspected, please call client  services to add the test for Enterovirus PCR (Test code [phone]).     Performed at [facility]   Report Status PENDING   Incomplete     Labs: Basic Metabolic Panel:  Recent Labs Lab 07/20/13 0420 07/21/13 0457 07/22/13 0531 07/24/13 0255 07/25/13 0553  NA 139 144 145 143 144  K 4.1 3.8 3.7 4.2 3.5*  CL 105 104 108 108 105  CO2 23 24 24 24 27   GLUCOSE 103* 105* 134* 94 105*  BUN 42* 67* 59* 30* 20  CREATININE 1.03 1.01 1.01 1.16 1.11  CALCIUM 8.2* 8.3* 8.1* 7.8* 8.3*   Liver Function Tests:  Recent Labs Lab 07/18/13 1800 07/19/13 0420 07/20/13 0420 07/21/13 0457 07/22/13 0531  AST 139* 106* 77* 49* 41*  ALT 157* 139* 118* 97* 70*  ALKPHOS 461* 413* 349* 359* 221*  BILITOT 1.0 0.9 0.6 0.6 0.4  PROT 6.5 6.3 5.9* 6.3 5.4*  ALBUMIN 2.5* 2.4* 2.3* 2.5* 2.3*   No results found for this basename: LIPASE, AMYLASE,  in the last 168 hours No results found for this basename: AMMONIA,  in the last 168 hours CBC:  Recent Labs Lab 07/20/13 0420 07/21/13 0457 07/21/13 1800  07/22/13 0531 07/22/13 2104 07/23/13 0636 07/23/13 2030 07/24/13 0255 07/25/13 0553  WBC 14.0* 17.6* 20.1*  --  21.5*  --   --   --   --  13.0*  HGB 10.4* 10.6* 9.1*  < > 7.8* 8.1* 8.0* 7.8* 7.7* 8.6*  HCT 32.3* 32.5* 27.7*  < > 23.5* 24.5* 24.5* 23.6* 23.5* 26.5*  MCV 92.0 91.8 91.7  --  90.4  --   --   --   --  90.8  PLT 304 430* 420*  --  437*  --   --   --   --  405*  < > = values in this interval not displayed. Cardiac Enzymes: No results found for this basename: CKTOTAL, CKMB, CKMBINDEX, TROPONINI,  in the last 168 hours BNP: BNP (last 3 results)  Recent  Labs  07/13/13 1835  PROBNP 519.5*   CBG: No results found for this basename: GLUCAP,  in the last 168 hours     Signed:  [facility] 07/25/2013, 3:38 PM

## 2013-07-28 ENCOUNTER — Encounter: Payer: Self-pay | Admitting: Internal Medicine

## 2013-07-28 ENCOUNTER — Inpatient Hospital Stay (HOSPITAL_COMMUNITY)
Admission: EM | Admit: 2013-07-28 | Discharge: 2013-08-04 | DRG: 377 | Disposition: A | Discharge status: Home / Self Care | Payer: Medicare Other | Attending: Internal Medicine | Admitting: Internal Medicine

## 2013-07-28 ENCOUNTER — Ambulatory Visit (INDEPENDENT_AMBULATORY_CARE_PROVIDER_SITE_OTHER): Payer: Medicare Other | Admitting: Internal Medicine

## 2013-07-28 ENCOUNTER — Ambulatory Visit (INDEPENDENT_AMBULATORY_CARE_PROVIDER_SITE_OTHER)
Admission: RE | Admit: 2013-07-28 | Discharge: 2013-07-28 | Disposition: A | Discharge status: Home / Self Care | Payer: Medicare Other | Source: Ambulatory Visit | Attending: Internal Medicine | Admitting: Internal Medicine

## 2013-07-28 ENCOUNTER — Other Ambulatory Visit (INDEPENDENT_AMBULATORY_CARE_PROVIDER_SITE_OTHER): Payer: Medicare Other

## 2013-07-28 ENCOUNTER — Encounter (HOSPITAL_COMMUNITY): Payer: Self-pay | Admitting: Emergency Medicine

## 2013-07-28 VITALS — BP 82/50 | HR 86 | Temp 99.9°F | Wt 299.8 lb

## 2013-07-28 DIAGNOSIS — K5730 Diverticulosis of large intestine without perforation or abscess without bleeding: Secondary | ICD-10-CM | POA: Diagnosis present

## 2013-07-28 DIAGNOSIS — K921 Melena: Secondary | ICD-10-CM

## 2013-07-28 DIAGNOSIS — R0602 Shortness of breath: Secondary | ICD-10-CM | POA: Diagnosis not present

## 2013-07-28 DIAGNOSIS — K759 Inflammatory liver disease, unspecified: Secondary | ICD-10-CM

## 2013-07-28 DIAGNOSIS — Z7901 Long term (current) use of anticoagulants: Secondary | ICD-10-CM

## 2013-07-28 DIAGNOSIS — R059 Cough, unspecified: Secondary | ICD-10-CM | POA: Diagnosis not present

## 2013-07-28 DIAGNOSIS — R05 Cough: Secondary | ICD-10-CM

## 2013-07-28 DIAGNOSIS — R7401 Elevation of levels of liver transaminase levels: Secondary | ICD-10-CM | POA: Diagnosis not present

## 2013-07-28 DIAGNOSIS — K922 Gastrointestinal hemorrhage, unspecified: Principal | ICD-10-CM | POA: Diagnosis present

## 2013-07-28 DIAGNOSIS — Z86711 Personal history of pulmonary embolism: Secondary | ICD-10-CM

## 2013-07-28 DIAGNOSIS — I959 Hypotension, unspecified: Secondary | ICD-10-CM | POA: Diagnosis present

## 2013-07-28 DIAGNOSIS — R5381 Other malaise: Secondary | ICD-10-CM | POA: Diagnosis not present

## 2013-07-28 DIAGNOSIS — K7689 Other specified diseases of liver: Secondary | ICD-10-CM | POA: Diagnosis present

## 2013-07-28 DIAGNOSIS — B9789 Other viral agents as the cause of diseases classified elsewhere: Secondary | ICD-10-CM | POA: Diagnosis present

## 2013-07-28 DIAGNOSIS — Z96659 Presence of unspecified artificial knee joint: Secondary | ICD-10-CM | POA: Diagnosis not present

## 2013-07-28 DIAGNOSIS — K7200 Acute and subacute hepatic failure without coma: Secondary | ICD-10-CM | POA: Diagnosis not present

## 2013-07-28 DIAGNOSIS — E785 Hyperlipidemia, unspecified: Secondary | ICD-10-CM

## 2013-07-28 DIAGNOSIS — I10 Essential (primary) hypertension: Secondary | ICD-10-CM

## 2013-07-28 DIAGNOSIS — Z86010 Personal history of colonic polyps: Secondary | ICD-10-CM

## 2013-07-28 DIAGNOSIS — G4730 Sleep apnea, unspecified: Secondary | ICD-10-CM

## 2013-07-28 DIAGNOSIS — R079 Chest pain, unspecified: Secondary | ICD-10-CM | POA: Diagnosis not present

## 2013-07-28 DIAGNOSIS — Z8371 Family history of colonic polyps: Secondary | ICD-10-CM

## 2013-07-28 DIAGNOSIS — Z6841 Body Mass Index (BMI) 40.0 and over, adult: Secondary | ICD-10-CM | POA: Diagnosis not present

## 2013-07-28 DIAGNOSIS — R972 Elevated prostate specific antigen [PSA]: Secondary | ICD-10-CM

## 2013-07-28 DIAGNOSIS — Z83719 Family history of colon polyps, unspecified: Secondary | ICD-10-CM

## 2013-07-28 DIAGNOSIS — I5033 Acute on chronic diastolic (congestive) heart failure: Secondary | ICD-10-CM | POA: Diagnosis not present

## 2013-07-28 DIAGNOSIS — I313 Pericardial effusion (noninflammatory): Secondary | ICD-10-CM

## 2013-07-28 DIAGNOSIS — I3139 Other pericardial effusion (noninflammatory): Secondary | ICD-10-CM | POA: Diagnosis present

## 2013-07-28 DIAGNOSIS — D72829 Elevated white blood cell count, unspecified: Secondary | ICD-10-CM | POA: Diagnosis present

## 2013-07-28 DIAGNOSIS — M109 Gout, unspecified: Secondary | ICD-10-CM

## 2013-07-28 DIAGNOSIS — K254 Chronic or unspecified gastric ulcer with hemorrhage: Secondary | ICD-10-CM | POA: Diagnosis present

## 2013-07-28 DIAGNOSIS — I4891 Unspecified atrial fibrillation: Secondary | ICD-10-CM | POA: Diagnosis not present

## 2013-07-28 DIAGNOSIS — I509 Heart failure, unspecified: Secondary | ICD-10-CM | POA: Diagnosis not present

## 2013-07-28 DIAGNOSIS — E861 Hypovolemia: Secondary | ICD-10-CM | POA: Diagnosis present

## 2013-07-28 DIAGNOSIS — I2699 Other pulmonary embolism without acute cor pulmonale: Secondary | ICD-10-CM

## 2013-07-28 DIAGNOSIS — J9819 Other pulmonary collapse: Secondary | ICD-10-CM | POA: Diagnosis not present

## 2013-07-28 DIAGNOSIS — G4733 Obstructive sleep apnea (adult) (pediatric): Secondary | ICD-10-CM | POA: Diagnosis present

## 2013-07-28 DIAGNOSIS — I319 Disease of pericardium, unspecified: Secondary | ICD-10-CM

## 2013-07-28 DIAGNOSIS — R053 Chronic cough: Secondary | ICD-10-CM

## 2013-07-28 DIAGNOSIS — R748 Abnormal levels of other serum enzymes: Secondary | ICD-10-CM | POA: Diagnosis present

## 2013-07-28 DIAGNOSIS — D649 Anemia, unspecified: Secondary | ICD-10-CM

## 2013-07-28 DIAGNOSIS — R7402 Elevation of levels of lactic acid dehydrogenase (LDH): Secondary | ICD-10-CM | POA: Diagnosis not present

## 2013-07-28 DIAGNOSIS — F3289 Other specified depressive episodes: Secondary | ICD-10-CM

## 2013-07-28 DIAGNOSIS — Z8249 Family history of ischemic heart disease and other diseases of the circulatory system: Secondary | ICD-10-CM

## 2013-07-28 DIAGNOSIS — D126 Benign neoplasm of colon, unspecified: Secondary | ICD-10-CM | POA: Diagnosis present

## 2013-07-28 DIAGNOSIS — Z823 Family history of stroke: Secondary | ICD-10-CM

## 2013-07-28 DIAGNOSIS — E8881 Metabolic syndrome: Secondary | ICD-10-CM

## 2013-07-28 DIAGNOSIS — Z9884 Bariatric surgery status: Secondary | ICD-10-CM

## 2013-07-28 DIAGNOSIS — R0989 Other specified symptoms and signs involving the circulatory and respiratory systems: Secondary | ICD-10-CM | POA: Diagnosis not present

## 2013-07-28 DIAGNOSIS — D539 Nutritional anemia, unspecified: Secondary | ICD-10-CM | POA: Diagnosis present

## 2013-07-28 DIAGNOSIS — R5383 Other fatigue: Secondary | ICD-10-CM | POA: Diagnosis not present

## 2013-07-28 DIAGNOSIS — N289 Disorder of kidney and ureter, unspecified: Secondary | ICD-10-CM | POA: Diagnosis not present

## 2013-07-28 DIAGNOSIS — J90 Pleural effusion, not elsewhere classified: Secondary | ICD-10-CM | POA: Diagnosis not present

## 2013-07-28 DIAGNOSIS — Z87442 Personal history of urinary calculi: Secondary | ICD-10-CM

## 2013-07-28 DIAGNOSIS — Z87891 Personal history of nicotine dependence: Secondary | ICD-10-CM | POA: Diagnosis not present

## 2013-07-28 DIAGNOSIS — Z833 Family history of diabetes mellitus: Secondary | ICD-10-CM

## 2013-07-28 DIAGNOSIS — F329 Major depressive disorder, single episode, unspecified: Secondary | ICD-10-CM

## 2013-07-28 DIAGNOSIS — M26609 Unspecified temporomandibular joint disorder, unspecified side: Secondary | ICD-10-CM

## 2013-07-28 DIAGNOSIS — G629 Polyneuropathy, unspecified: Secondary | ICD-10-CM

## 2013-07-28 DIAGNOSIS — R7309 Other abnormal glucose: Secondary | ICD-10-CM

## 2013-07-28 DIAGNOSIS — N400 Enlarged prostate without lower urinary tract symptoms: Secondary | ICD-10-CM

## 2013-07-28 DIAGNOSIS — I480 Paroxysmal atrial fibrillation: Secondary | ICD-10-CM | POA: Diagnosis not present

## 2013-07-28 DIAGNOSIS — I951 Orthostatic hypotension: Secondary | ICD-10-CM

## 2013-07-28 HISTORY — DX: Paroxysmal atrial fibrillation: I48.0

## 2013-07-28 HISTORY — DX: Anemia, unspecified: D64.9

## 2013-07-28 LAB — BASIC METABOLIC PANEL
BUN: 28 mg/dL — ABNORMAL HIGH (ref 6–23)
CALCIUM: 8 mg/dL — AB (ref 8.4–10.5)
CO2: 22 meq/L (ref 19–32)
CREATININE: 1.4 mg/dL (ref 0.4–1.5)
Chloride: 100 mEq/L (ref 96–112)
GFR: 52.33 mL/min — ABNORMAL LOW (ref >60.00)
Glucose, Bld: 138 mg/dL — ABNORMAL HIGH (ref 70–99)
Potassium: 3.9 mEq/L (ref 3.5–5.1)
SODIUM: 135 meq/L (ref 135–145)

## 2013-07-28 LAB — URINALYSIS, ROUTINE W REFLEX MICROSCOPIC
GLUCOSE, UA: NEGATIVE mg/dL
HGB URINE DIPSTICK: NEGATIVE
KETONES UR: NEGATIVE mg/dL
Leukocytes, UA: NEGATIVE
Nitrite: NEGATIVE
PROTEIN: NEGATIVE mg/dL
Specific Gravity, Urine: 1.024 (ref 1.005–1.030)
UROBILINOGEN UA: 1 mg/dL (ref 0.0–1.0)
pH: 5 (ref 5.0–8.0)

## 2013-07-28 LAB — CBC WITH DIFFERENTIAL/PLATELET
BASOS PCT: 0.3 % (ref 0.0–3.0)
Basophils Absolute: 0.1 10*3/uL (ref 0.0–0.1)
EOS ABS: 0 10*3/uL (ref 0.0–0.7)
Eosinophils Relative: 0.1 % (ref 0.0–5.0)
HCT: 28.2 % — ABNORMAL LOW (ref 39.0–52.0)
HEMOGLOBIN: 9.3 g/dL — AB (ref 13.0–17.0)
LYMPHS PCT: 4.3 % — AB (ref 12.0–46.0)
Lymphs Abs: 0.9 10*3/uL (ref 0.7–4.0)
MCHC: 32.9 g/dL (ref 30.0–36.0)
MCV: 89.6 fl (ref 78.0–100.0)
Monocytes Absolute: 1.9 10*3/uL — ABNORMAL HIGH (ref 0.1–1.0)
Monocytes Relative: 8.9 % (ref 3.0–12.0)
Neutro Abs: 18.9 10*3/uL — ABNORMAL HIGH (ref 1.4–7.7)
Neutrophils Relative %: 86.4 % — ABNORMAL HIGH (ref 43.0–77.0)
Platelets: 478 10*3/uL — ABNORMAL HIGH (ref 150.0–400.0)
RBC: 3.15 Mil/uL — ABNORMAL LOW (ref 4.22–5.81)
RDW: 16.8 % — ABNORMAL HIGH (ref 11.5–14.6)
WBC: 21.8 10*3/uL (ref 4.5–10.5)

## 2013-07-28 LAB — HEPATIC FUNCTION PANEL
ALT: 224 U/L — AB (ref 0–53)
AST: 359 U/L — ABNORMAL HIGH (ref 0–37)
Albumin: 2.7 g/dL — ABNORMAL LOW (ref 3.5–5.2)
Alkaline Phosphatase: 382 U/L — ABNORMAL HIGH (ref 39–117)
BILIRUBIN DIRECT: 0.6 mg/dL — AB (ref 0.0–0.3)
Total Bilirubin: 1.3 mg/dL — ABNORMAL HIGH (ref 0.3–1.2)
Total Protein: 6.3 g/dL (ref 6.0–8.3)

## 2013-07-28 LAB — URIC ACID: Uric Acid, Serum: 6.4 mg/dL (ref 4.0–7.8)

## 2013-07-28 LAB — LACTIC ACID, PLASMA: Lactic Acid, Venous: 1.3 mmol/L (ref 0.5–2.2)

## 2013-07-28 LAB — VIRAL CULTURE VIRC

## 2013-07-28 MED ORDER — COLCHICINE 0.6 MG PO TABS: 0.6000 mg | ORAL_TABLET | Freq: Two times a day (BID) | ORAL | Status: DC

## 2013-07-28 MED ORDER — METOPROLOL TARTRATE 25 MG PO TABS: ORAL_TABLET | ORAL | Status: DC

## 2013-07-28 MED ORDER — SODIUM CHLORIDE 0.9 % IV SOLN
Freq: Once | INTRAVENOUS | Status: AC
  Administered 2013-07-28: 1000 mL via INTRAVENOUS

## 2013-07-28 MED ORDER — SODIUM CHLORIDE 0.9 % IV BOLUS (SEPSIS)
500.0000 mL | Freq: Once | INTRAVENOUS | Status: AC
  Administered 2013-07-28: 500 mL via INTRAVENOUS

## 2013-07-28 NOTE — ED Provider Notes (Signed)
CSN: [id number]     Arrival date & time 07/28/13  1923 History   First MD Initiated Contact with Patient 07/28/13 2106     Chief Complaint  Patient presents with  . Fatigue     (Consider location/radiation/quality/duration/timing/severity/associated sxs/prior Treatment) Patient is a 68 y.o. male presenting with weakness. The history is provided by the patient. No language interpreter was used.  Weakness Associated symptoms include fatigue and weakness. Pertinent negatives include no abdominal pain, chest pain, coughing, fever, myalgias or vomiting. Associated symptoms comments: The patient was recently admitted (07/16/13) for CHF, PE, and in-hospital onset of a-fib, and was discharged 3 days ago (07/25/13). Since discharge he has been increasingly weak, fatigued, and with a low blood pressure when checked at home consistently in the 80's. No syncope. He denies chest pain, vomiting. He reports that while in the hospital the care team added metoprolol to his medication regimen and stopped amlodipine. He reports that when he went home he continued to take both medications by mistake. He was seen by Dr. [name] earlier today and was found to have a significantly elevated WBC count (21.8) and .    Past Medical History  Diagnosis Date  . Benign prostatic hypertrophy     several prostate biopsies neg for cancer.   . Hypertension   . Gout   . Depression   . FH: colonic polyps 2010  . Morbid obesity   . Complication of anesthesia     MORPHINE CAUSES HALLUCINATIONS  . Asthma     childhood asthma  . Numbness     feet / legs  . History of kidney stones   . Kidney cysts   . Sleep apnea     USES C PAP  . Bilateral hip pain    Past Surgical History  Procedure Laterality Date  . Total knee replacment      bilat  . Cholecystectomy  1989  . Trigger finger repair      also lue, cts surgically repaired  . Renal calculi    . Tonsillectomy    . Lithotripsy    . Arthroscopy knee w/ drilling    .  Breath tek h pylori N/A 12/23/2012    Procedure: BREATH TEK H PYLORI;  Surgeon: Gayland Curry, MD;  Location: [facility];  Service: General;  Laterality: N/A;  . Wisdom tooth extraction    . Laparoscopic gastric banding with hiatal hernia repair N/A 04/08/2013    Procedure: LAPAROSCOPIC GASTRIC BANDING WITH possible  HIATAL HERNIA REPAIR;  Surgeon: Gayland Curry, MD;  Location: [facility];  Service: General;  Laterality: N/A;  . Subxyphoid pericardial window N/A 07/18/2013    Procedure: SUBXYPHOID PERICARDIAL WINDOW;  Surgeon: Grace Isaac, MD;  Location: [facility];  Service: Thoracic;  Laterality: N/A;  . Intraoperative transesophageal echocardiogram N/A 07/18/2013    Procedure: INTRAOPERATIVE TRANSESOPHAGEAL ECHOCARDIOGRAM;  Surgeon: Grace Isaac, MD;  Location: [facility];  Service: Open Heart Surgery;  Laterality: N/A;  . Esophagogastroduodenoscopy N/A 07/22/2013    Procedure: ESOPHAGOGASTRODUODENOSCOPY (EGD);  Surgeon: Irene Shipper, MD;  Location: [facility];  Service: Endoscopy;  Laterality: N/A;   Family History  Problem Relation Age of Onset  . Depression Mother   . Hypertension Mother   . Heart disease Father     MI  . Depression Brother   . Stroke Paternal Aunt     CVA  . Diabetes Paternal Uncle   . Heart disease Paternal Uncle     MI  .  Heart disease Paternal Grandmother     MI  . Diabetes Cousin     PATERNAL   History  Substance Use Topics  . Smoking status: Former Smoker    Types: Cigarettes    Quit date: 08/30/1974  . Smokeless tobacco: Not on file  . Alcohol Use: Yes     Comment: Occasionally; 1 small drink a week    Review of Systems  Constitutional: Positive for fatigue. Negative for fever.  Eyes: Negative for visual disturbance.  Respiratory: Positive for shortness of breath. Negative for cough.   Cardiovascular: Negative for chest pain.  Gastrointestinal: Negative for vomiting and abdominal pain.  Musculoskeletal: Negative for myalgias.  Neurological:  Positive for weakness and light-headedness. Negative for speech difficulty.      Allergies  Propoxyphene n-acetaminophen; Codeine; Morphine; and Penicillins  Home Medications   Prior to Admission medications   Medication Sig Start Date End Date Taking? Authorizing Provider  allopurinol (ZYLOPRIM) 300 MG tablet Take 300 mg by mouth daily with breakfast.    Yes Historical Provider, MD  colchicine 0.6 MG tablet Take 1 tablet (0.6 mg total) by mouth 2 (two) times daily. 1 qd 07/28/13  Yes Hendricks Limes, MD  Cyanocobalamin (VITAMIN B-12) 1000 MCG SUBL Place 1,000 mcg under the tongue every morning.   Yes Historical Provider, MD  Dextromethorphan-Guaifenesin (ROBITUSSIN DM PO) Take 15 mLs by mouth daily as needed (cough).   Yes Historical Provider, MD  docusate sodium 100 MG CAPS Take 100 mg by mouth 2 (two) times daily. 07/25/13  Yes Kelvin Cellar, MD  finasteride (PROSCAR) 5 MG tablet Take 5 mg by mouth daily.    Yes Historical Provider, MD  furosemide (LASIX) 20 MG tablet Take 1 tablet (20 mg total) by mouth daily. 07/25/13  Yes Kelvin Cellar, MD  gabapentin (NEURONTIN) 100 MG capsule Take 1 capsule (100 mg total) by mouth 2 (two) times daily. 05/06/13  Yes Hendricks Limes, MD  LORazepam (ATIVAN) 0.5 MG tablet Take 0.5-1 mg by mouth 2 (two) times daily. 0.5 mg in the morning and 1 mg at bedtime   Yes Historical Provider, MD  metoprolol tartrate (LOPRESSOR) 25 MG tablet 1/2 bid due to low BP 07/28/13  Yes Hendricks Limes, MD  Multiple Vitamin (MULTIVITAMIN) tablet Take 1 tablet by mouth daily. Chewable tablet with iron   Yes Historical Provider, MD  pantoprazole (PROTONIX) 40 MG tablet Take 1 tablet (40 mg total) by mouth 2 (two) times daily. 07/25/13  Yes Kelvin Cellar, MD  polyethylene glycol (MIRALAX / GLYCOLAX) packet Take 17 g by mouth daily. 07/25/13  Yes Kelvin Cellar, MD  risperiDONE (RISPERDAL) 0.5 MG tablet Take 1 tablet (0.5 mg total) by mouth at bedtime. 06/03/13  Yes Kathlee Nations, MD  rivaroxaban (XARELTO) 20 MG TABS tablet Take 1 tablet (20 mg total) by mouth daily with supper. 07/25/13  Yes Kelvin Cellar, MD  sertraline (ZOLOFT) 100 MG tablet Take 1.5 tablets (150 mg total) by mouth daily with breakfast. 06/03/13  Yes Kathlee Nations, MD  Tetrahydrozoline HCl (VISINE OP) Place 2 drops into both eyes daily as needed (dry eyes).   Yes Historical Provider, MD   BP 102/60  Pulse 75  Temp(Src) 98.9 F (37.2 C) (Oral)  Resp 16  SpO2 97% Physical Exam  Constitutional: He is oriented to person, place, and time. He appears well-developed and well-nourished.  HENT:  Head: Normocephalic.  Eyes: Conjunctivae are normal.  Neck: Normal range of motion. Neck supple.  Cardiovascular:  Normal rate and regular rhythm.   Pulmonary/Chest: Effort normal and breath sounds normal.  Abdominal: Soft. Bowel sounds are normal. There is no tenderness. There is no rebound and no guarding.  Musculoskeletal: Normal range of motion.  Neurological: He is alert and oriented to person, place, and time.  Skin: Skin is warm and dry. No rash noted.  Psychiatric: He has a normal mood and affect.    ED Course  Procedures (including critical care time) Labs Review Labs Reviewed  CULTURE, BLOOD (ROUTINE X 2)  CULTURE, BLOOD (ROUTINE X 2)  URINE CULTURE  URINALYSIS, ROUTINE W REFLEX MICROSCOPIC  LACTIC ACID, PLASMA   Results for orders placed during the hospital encounter of 07/28/13  URINALYSIS, ROUTINE W REFLEX MICROSCOPIC      Result Value Ref Range   Color, Urine AMBER (*) YELLOW   APPearance CLEAR  CLEAR   Specific Gravity, Urine 1.024  1.005 - 1.030   pH 5.0  5.0 - 8.0   Glucose, UA NEGATIVE  NEGATIVE mg/dL   Hgb urine dipstick NEGATIVE  NEGATIVE   Bilirubin Urine SMALL (*) NEGATIVE   Ketones, ur NEGATIVE  NEGATIVE mg/dL   Protein, ur NEGATIVE  NEGATIVE mg/dL   Urobilinogen, UA 1.0  0.0 - 1.0 mg/dL   Nitrite NEGATIVE  NEGATIVE   Leukocytes, UA NEGATIVE  NEGATIVE  LACTIC  ACID, PLASMA      Result Value Ref Range   Lactic Acid, Venous 1.3  0.5 - 2.2 mmol/L    Imaging Review Dg Chest 2 View  07/28/2013   CLINICAL DATA:  Chest congestion.  Short of breath.  EXAM: CHEST  2 VIEW  COMPARISON:  07/22/2013  FINDINGS: Cardiac silhouette is normal in size and configuration. Normal mediastinal and hilar contours.  Clear lungs.  No pleural effusion.  No pneumothorax.  Bony thorax is intact.  IMPRESSION: No acute cardiopulmonary disease.   Electronically Signed   By: Lajean Manes M.D.   On: 07/28/2013 13:21     EKG Interpretation None      MDM   Final diagnoses:  None    1. Weakness 2. Leukocytosis 3. Elevated liver transaminases  The patient's blood pressure is consistently low during evaluation. He remains pain free, alert, oriented. CBC, chemistries were not repeated given documentation of abnormal labs provided. Discussed the patient with [facility] who accepts for admission and further management.     Dewaine Oats, PA-C 08/02/13 0017

## 2013-07-28 NOTE — ED Provider Notes (Signed)
Date: 07/28/2013  Rate: 85  Rhythm: normal sinus rhythm  QRS Axis: normal  Intervals: normal  ST/T Wave abnormalities: t wave inversion in inferior and anterolateral leads  Conduction Disutrbances:none  Narrative Interpretation:   Old EKG Reviewed: sinus rhythm has replaced atrial fibrillation   Threasa Beards, MD 07/28/13 2344

## 2013-07-28 NOTE — ED Notes (Addendum)
Pt sent by PCP Md [name] for increased fatigue c 3 days. Pt recently admitted for pericarditis, CHF, A. Fib and PE (dc on 4/25).  Pt states BP at home ranging from 61'-44'R systolic x 4 days. PT is now AO x4, neuro intact. Denies CP, abdominal pain. Pt in NAD, noted to be pale. Pt also c/o tarry stools x 2 days.

## 2013-07-28 NOTE — ED Notes (Signed)
Phlebotomy at bedside.

## 2013-07-28 NOTE — Progress Notes (Signed)
Pre visit review using our clinic review tool, if applicable. No additional management support is needed unless otherwise documented below in the visit note. 

## 2013-07-28 NOTE — Progress Notes (Signed)
   Subjective:    Patient ID: [name], male    DOB: February 01, 1946, 67 y.o.   MRN: [id number]  HPI  The hospital records were reviewed; a summary of the 4/15-4/24/15 hospital course was entered into the problem list. He was admitted with pulmonary thromboemboli and pericardial effusion. He was felt to have post viral or cardial effusion hepatitis. He also was found to have a gastric ulcer with melena and anemia. His course complicated by heart failure as well as atrial fibrillation.  Followup labs were recommended 5/1; but he has come in because of profound fatigue.    Review of Systems   His blood pressure has been low at home in the range of 80 over less than 50.  He describes a cough which is essentially dry; does cause lightheadedness.  He denies abdominal pain or rectal bleeding. He does have dark stools but is on iron.     Objective:   Physical Exam General appearance :disheveled; pale,fair health and nourishment ;w/o distress.  Eyes: No conjunctival inflammation or scleral icterus is present.  Oral exam: Dental hygiene is good; lips and gums are healthy appearing.There is no oropharyngeal erythema or exudate noted. Tongue slightly dry  Heart:  Normal rate and regular rhythm. S1 and S2 normal without gallop, murmur, click, rub or other extra sounds  . Distant heart sounds   Lungs:Chest clear to auscultation; no wheezes, rhonchi,rales ,or rubs present.No increased work of breathing.   Abdomen: bowel sounds decreased, soft and non-tender without masses, organomegaly or hernias noted.  No guarding or rebound . No tenderness over the flanks to percussion. Bariatric device R abdomen  Musculoskeletal: too weak to get on table Skin:cool & damp.  Intact without suspicious lesions or rashes ; no jaundice; slight tenting  Lymphatic: No lymphadenopathy is noted about the head, neck, axilla  Flat affect             Assessment & Plan:  #1 fatigue  #2 cough #3  gastric ulcer See orders

## 2013-07-28 NOTE — Patient Instructions (Signed)
Your next office appointment will be determined based upon review of your pending labs & x-rays. Those instructions will be transmitted to you through My Chart . 

## 2013-07-29 ENCOUNTER — Inpatient Hospital Stay (HOSPITAL_COMMUNITY): Payer: Medicare Other

## 2013-07-29 ENCOUNTER — Telehealth: Payer: Self-pay | Admitting: Physician Assistant

## 2013-07-29 ENCOUNTER — Encounter (HOSPITAL_COMMUNITY): Payer: Self-pay | Admitting: Cardiology

## 2013-07-29 DIAGNOSIS — I319 Disease of pericardium, unspecified: Secondary | ICD-10-CM

## 2013-07-29 DIAGNOSIS — I951 Orthostatic hypotension: Secondary | ICD-10-CM | POA: Insufficient documentation

## 2013-07-29 DIAGNOSIS — D539 Nutritional anemia, unspecified: Secondary | ICD-10-CM | POA: Diagnosis present

## 2013-07-29 DIAGNOSIS — D72829 Elevated white blood cell count, unspecified: Secondary | ICD-10-CM

## 2013-07-29 DIAGNOSIS — I480 Paroxysmal atrial fibrillation: Secondary | ICD-10-CM

## 2013-07-29 DIAGNOSIS — I4891 Unspecified atrial fibrillation: Secondary | ICD-10-CM

## 2013-07-29 DIAGNOSIS — I959 Hypotension, unspecified: Secondary | ICD-10-CM | POA: Diagnosis present

## 2013-07-29 DIAGNOSIS — D649 Anemia, unspecified: Secondary | ICD-10-CM

## 2013-07-29 DIAGNOSIS — K922 Gastrointestinal hemorrhage, unspecified: Secondary | ICD-10-CM | POA: Diagnosis present

## 2013-07-29 HISTORY — DX: Paroxysmal atrial fibrillation: I48.0

## 2013-07-29 HISTORY — DX: Anemia, unspecified: D64.9

## 2013-07-29 LAB — CBC
HEMATOCRIT: 26.7 % — AB (ref 39.0–52.0)
Hemoglobin: 8.7 g/dL — ABNORMAL LOW (ref 13.0–17.0)
MCH: 29.2 pg (ref 26.0–34.0)
MCHC: 32.6 g/dL (ref 30.0–36.0)
MCV: 89.6 fL (ref 78.0–100.0)
Platelets: 402 10*3/uL — ABNORMAL HIGH (ref 150–400)
RBC: 2.98 MIL/uL — AB (ref 4.22–5.81)
RDW: 16.2 % — AB (ref 11.5–15.5)
WBC: 15.7 10*3/uL — ABNORMAL HIGH (ref 4.0–10.5)

## 2013-07-29 LAB — BASIC METABOLIC PANEL
BUN: 27 mg/dL — AB (ref 6–23)
CALCIUM: 8.1 mg/dL — AB (ref 8.4–10.5)
CHLORIDE: 102 meq/L (ref 96–112)
CO2: 21 meq/L (ref 19–32)
CREATININE: 1.17 mg/dL (ref 0.50–1.35)
GFR calc Af Amer: 73 mL/min — ABNORMAL LOW (ref >90)
GFR calc non Af Amer: 63 mL/min — ABNORMAL LOW (ref >90)
Glucose, Bld: 120 mg/dL — ABNORMAL HIGH (ref 70–99)
Potassium: 3.7 mEq/L (ref 3.7–5.3)
Sodium: 139 mEq/L (ref 137–147)

## 2013-07-29 LAB — HEPATITIS PANEL, ACUTE
HCV Ab: NEGATIVE
HEP B C IGM: NONREACTIVE
HEP B S AG: NEGATIVE
Hep A IgM: NONREACTIVE

## 2013-07-29 LAB — HEMOGLOBIN AND HEMATOCRIT, BLOOD
HEMATOCRIT: 25 % — AB (ref 39.0–52.0)
HEMOGLOBIN: 8.1 g/dL — AB (ref 13.0–17.0)

## 2013-07-29 MED ORDER — METOPROLOL TARTRATE 12.5 MG HALF TABLET
12.5000 mg | ORAL_TABLET | Freq: Two times a day (BID) | ORAL | Status: DC
Start: 2013-07-29 — End: 2013-08-04
  Administered 2013-07-29 – 2013-08-03 (×9): 12.5 mg via ORAL
  Filled 2013-07-29 (×14): qty 1

## 2013-07-29 MED ORDER — RIVAROXABAN 20 MG PO TABS
20.0000 mg | ORAL_TABLET | Freq: Every day | ORAL | Status: DC
  Filled 2013-07-29: qty 1

## 2013-07-29 MED ORDER — ONDANSETRON 4 MG PO TBDP
4.0000 mg | ORAL_TABLET | Freq: Three times a day (TID) | ORAL | Status: DC | PRN
  Filled 2013-07-29: qty 1

## 2013-07-29 MED ORDER — COLCHICINE 0.6 MG PO TABS
0.6000 mg | ORAL_TABLET | Freq: Two times a day (BID) | ORAL | Status: DC
  Filled 2013-07-29 (×5): qty 1

## 2013-07-29 MED ORDER — HYDROCOD POLST-CHLORPHEN POLST 10-8 MG/5ML PO LQCR
5.0000 mL | Freq: Two times a day (BID) | ORAL | Status: DC | PRN
  Administered 2013-07-29 – 2013-08-01 (×6): 5 mL via ORAL
  Filled 2013-07-29 (×7): qty 5

## 2013-07-29 MED ORDER — PANTOPRAZOLE SODIUM 40 MG IV SOLR
40.0000 mg | Freq: Two times a day (BID) | INTRAVENOUS | Status: DC
  Administered 2013-07-29: 40 mg via INTRAVENOUS
  Filled 2013-07-29 (×4): qty 40

## 2013-07-29 MED ORDER — RISPERIDONE 0.5 MG PO TABS
0.5000 mg | ORAL_TABLET | Freq: Every day | ORAL | Status: DC
  Administered 2013-07-29 – 2013-08-03 (×6): 0.5 mg via ORAL
  Filled 2013-07-29 (×8): qty 1

## 2013-07-29 MED ORDER — ADULT MULTIVITAMIN W/MINERALS CH
1.0000 | ORAL_TABLET | Freq: Every day | ORAL | Status: DC
  Administered 2013-07-29 – 2013-07-30 (×2): 1 via ORAL
  Filled 2013-07-29 (×2): qty 1

## 2013-07-29 MED ORDER — GUAIFENESIN-DM 100-10 MG/5ML PO SYRP
5.0000 mL | ORAL_SOLUTION | ORAL | Status: DC | PRN
  Administered 2013-07-29 (×4): 5 mL via ORAL
  Filled 2013-07-29 (×4): qty 5

## 2013-07-29 MED ORDER — SERTRALINE HCL 100 MG PO TABS
150.0000 mg | ORAL_TABLET | Freq: Every day | ORAL | Status: DC
Start: 2013-07-29 — End: 2013-08-04
  Administered 2013-07-29 – 2013-08-04 (×7): 150 mg via ORAL
  Filled 2013-07-29 (×9): qty 1

## 2013-07-29 MED ORDER — GABAPENTIN 100 MG PO CAPS
100.0000 mg | ORAL_CAPSULE | Freq: Two times a day (BID) | ORAL | Status: DC
  Filled 2013-07-29 (×5): qty 1

## 2013-07-29 MED ORDER — SODIUM CHLORIDE 0.9 % IJ SOLN
3.0000 mL | Freq: Two times a day (BID) | INTRAMUSCULAR | Status: DC
  Administered 2013-07-29 – 2013-08-04 (×11): 3 mL via INTRAVENOUS

## 2013-07-29 MED ORDER — FINASTERIDE 5 MG PO TABS
5.0000 mg | ORAL_TABLET | Freq: Every day | ORAL | Status: DC
  Filled 2013-07-29: qty 1

## 2013-07-29 MED ORDER — PANTOPRAZOLE SODIUM 40 MG PO TBEC
40.0000 mg | DELAYED_RELEASE_TABLET | Freq: Two times a day (BID) | ORAL | Status: DC
  Administered 2013-07-29: 40 mg via ORAL
  Filled 2013-07-29: qty 1

## 2013-07-29 MED ORDER — LORAZEPAM 1 MG PO TABS
0.5000 mg | ORAL_TABLET | Freq: Two times a day (BID) | ORAL | Status: DC
  Administered 2013-07-29: 1 mg via ORAL
  Administered 2013-07-29: 0.5 mg via ORAL
  Administered 2013-07-30: 1 mg via ORAL
  Administered 2013-07-30: 0.5 mg via ORAL
  Administered 2013-07-31 – 2013-08-01 (×3): 1 mg via ORAL
  Administered 2013-08-02: 0.5 mg via ORAL
  Administered 2013-08-02 – 2013-08-03 (×3): 1 mg via ORAL
  Filled 2013-07-29 (×11): qty 1

## 2013-07-29 MED ORDER — ALLOPURINOL 300 MG PO TABS
300.0000 mg | ORAL_TABLET | Freq: Every day | ORAL | Status: DC
  Filled 2013-07-29 (×3): qty 1

## 2013-07-29 MED ORDER — FINASTERIDE 5 MG PO TABS
5.0000 mg | ORAL_TABLET | Freq: Every day | ORAL | Status: DC
  Administered 2013-07-29 – 2013-08-03 (×6): 5 mg via ORAL
  Filled 2013-07-29 (×7): qty 1

## 2013-07-29 MED ORDER — POLYETHYLENE GLYCOL 3350 17 G PO PACK
17.0000 g | PACK | Freq: Every day | ORAL | Status: DC
  Administered 2013-07-29 – 2013-07-30 (×2): 17 g via ORAL
  Filled 2013-07-29 (×3): qty 1

## 2013-07-29 MED ORDER — SODIUM CHLORIDE 0.9 % IV BOLUS (SEPSIS)
1000.0000 mL | Freq: Once | INTRAVENOUS | Status: AC
  Administered 2013-07-29: 1000 mL via INTRAVENOUS

## 2013-07-29 MED ORDER — DOCUSATE SODIUM 100 MG PO CAPS
100.0000 mg | ORAL_CAPSULE | Freq: Two times a day (BID) | ORAL | Status: DC
  Administered 2013-07-29 – 2013-07-30 (×4): 100 mg via ORAL
  Filled 2013-07-29 (×6): qty 1

## 2013-07-29 NOTE — ED Notes (Signed)
Patient transported to Ultrasound 

## 2013-07-29 NOTE — Consult Note (Addendum)
[facility] Consult: 3:04 PM 07/29/2013  LOS: 1 day    Referring Provider: Dr Coralyn Pear  Primary Care Physician:  [name], MD Primary Gastroenterologist:  Dr. Scarlette Shorts    Reason for Consultation:  FOBT + stools, hypotension and recent GIB  HPI   [name] is a 68 y/o WM.  Hx obesity, OSA (on CPAP), HTN, BPH, metabollic syndrome, depression. S/p bariatric lap band and repair of [facility] 04/08/13 (Dr Redmond Pulling), total weight loss of 85# pre and post surgery.  H Pylori breath tech test and serum H Pylori + 12/2012: planned to treat after surgery. Not yet accomplished  Admitted 07/16/13 - 07/2413 with pericardial effusion, PE, viral pericarditis, new onset A fib. S/p 4/17 pericardial window.  GIB and melena, ABL anemia (transfused 2 units, Hgb nadir 7.8) post op.  EGD while off Xarelto showed superficial gastric ulcer, no bleeding, no stigmata.  Restarted Xarelto within 36 hours. Had elevated LFTs and ultrasound negative (7 mm CBD). These were dropping over course of admission and felt secondary to shock liver.  Was supposed to start HPylori eradication but went home only on once daily Protonix.   Since Friday, his chronic cough is worse along with progressive weakness.  Cough initially improved with Robitussin DM per Dr [name], but med ceased being effective.  Admitted today with hypotension. + orthostatic BP.  Stools chronically dark, every other day.  On MVI with Iron.  Last stool this AM.  No fresh blood, no nausea.  + malaise. CXR is negative.  Hgb 8.7 c/w 9.3 last week.  WBC count is up to 21 yesterday, 15.7 today. LFTs are up again, ast/alt 350s/220s up from 40s/70s. T bili 1.3 up from 0.4.  Alk phos 382 up from 221.  2 d echo repeated 4/28 shows small decrease in pericardial fluid.  Ultrasound repeated today  and shows fatty liver.   No nausea, some anorexia.  No abdominal or chest pain.  No dysphagia.  No amber urine.  No excessive bleeding.  No syncope.  No headache, no NSAIDS.   No gout flare.   Past Medical History  Diagnosis Date  . Benign prostatic hypertrophy     several prostate biopsies neg for cancer.   . Hypertension   . Gout   . Depression   . FH: colonic polyps 2010  . Morbid obesity   . Complication of anesthesia     MORPHINE CAUSES HALLUCINATIONS  . Asthma     childhood asthma  . Numbness     feet / legs  . History of kidney stones   . Kidney cysts   . Sleep apnea     USES C PAP  . Bilateral hip pain   . PAF (paroxysmal atrial fibrillation) with RVR 07/29/2013  . Anemia 07/29/2013    Past Surgical History  Procedure Laterality Date  . Total knee replacment      bilat  . Cholecystectomy  1989  . Trigger finger repair      also lue, cts surgically repaired  . Renal calculi    .  Tonsillectomy    . Lithotripsy    . Arthroscopy knee w/ drilling    . Breath tek h pylori N/A 12/23/2012    Procedure: BREATH TEK H PYLORI;  Surgeon: Gayland Curry, MD;  Location: [facility];  Service: General;  Laterality: N/A;  . Wisdom tooth extraction    . Laparoscopic gastric banding with hiatal hernia repair N/A 04/08/2013    Procedure: LAPAROSCOPIC GASTRIC BANDING WITH possible  HIATAL HERNIA REPAIR;  Surgeon: Gayland Curry, MD;  Location: [facility];  Service: General;  Laterality: N/A;  . Subxyphoid pericardial window N/A 07/18/2013    Procedure: SUBXYPHOID PERICARDIAL WINDOW;  Surgeon: Grace Isaac, MD;  Location: [facility];  Service: Thoracic;  Laterality: N/A;  . Intraoperative transesophageal echocardiogram N/A 07/18/2013    Procedure: INTRAOPERATIVE TRANSESOPHAGEAL ECHOCARDIOGRAM;  Surgeon: Grace Isaac, MD;  Location: [facility];  Service: Open Heart Surgery;  Laterality: N/A;  . Esophagogastroduodenoscopy N/A 07/22/2013    Procedure: ESOPHAGOGASTRODUODENOSCOPY (EGD);  Surgeon:  Irene Shipper, MD;  Location[facility];  Service: Endoscopy;  Laterality: N/A;    Prior to Admission medications   Medication Sig Start Date End Date Taking? Authorizing Provider  Cyanocobalamin (VITAMIN B-12) 1000 MCG SUBL Place 1,000 mcg under the tongue every morning.   Yes Historical Provider, MD  Dextromethorphan-Guaifenesin (ROBITUSSIN DM PO) Take 15 mLs by mouth daily as needed (cough).   Yes Historical Provider, MD  docusate sodium 100 MG CAPS Take 100 mg by mouth 2 (two) times daily. 07/25/13  Yes Kelvin Cellar, MD  finasteride (PROSCAR) 5 MG tablet Take 5 mg by mouth daily.    Yes Historical Provider, MD  furosemide (LASIX) 20 MG tablet Take 1 tablet (20 mg total) by mouth daily. 07/25/13  Yes Kelvin Cellar, MD  LORazepam (ATIVAN) 0.5 MG tablet Take 0.5-1 mg by mouth 2 (two) times daily. 0.5 mg in the morning and 1 mg at bedtime   Yes Historical Provider, MD  metoprolol tartrate (LOPRESSOR) 25 MG tablet Take 12.5 mg by mouth 2 (two) times daily.   Yes Historical Provider, MD  Multiple Vitamin (MULTIVITAMIN) tablet Take 1 tablet by mouth daily. Chewable tablet with iron   Yes Historical Provider, MD  pantoprazole (PROTONIX) 40 MG tablet Take 1 tablet (40 mg total) by mouth 2 (two) times daily. 07/25/13  Yes Kelvin Cellar, MD  polyethylene glycol (MIRALAX / GLYCOLAX) packet Take 17 g by mouth daily. 07/25/13  Yes Kelvin Cellar, MD  risperiDONE (RISPERDAL) 0.5 MG tablet Take 1 tablet (0.5 mg total) by mouth at bedtime. 06/03/13  Yes Kathlee Nations, MD  rivaroxaban (XARELTO) 20 MG TABS tablet Take 1 tablet (20 mg total) by mouth daily with supper. 07/25/13  Yes Kelvin Cellar, MD  sertraline (ZOLOFT) 100 MG tablet Take 1.5 tablets (150 mg total) by mouth daily with breakfast. 06/03/13  Yes Kathlee Nations, MD  Tetrahydrozoline HCl (VISINE OP) Place 2 drops into both eyes daily as needed (dry eyes).   Yes Historical Provider, MD    Scheduled Meds: . allopurinol  300 mg Oral Q breakfast  .  colchicine  0.6 mg Oral BID  . docusate sodium  100 mg Oral BID  . finasteride  5 mg Oral QHS  . gabapentin  100 mg Oral BID  . LORazepam  0.5-1 mg Oral BID  . metoprolol tartrate  12.5 mg Oral BID  . pantoprazole (PROTONIX) IV  40 mg Intravenous Q12H  . polyethylene glycol  17 g Oral Daily  .  risperiDONE  0.5 mg Oral QHS  . sertraline  150 mg Oral Q breakfast  . sodium chloride  1,000 mL Intravenous Once  . sodium chloride  3 mL Intravenous Q12H   Infusions:   PRN Meds: guaiFENesin-dextromethorphan   Allergies as of 07/28/2013 - Review Complete 07/28/2013  Allergen Reaction Noted  . Propoxyphene n-acetaminophen Nausea Only   . Codeine    . Morphine    . Penicillins      Family History  Problem Relation Age of Onset  . Depression Mother   . Hypertension Mother   . Heart disease Father     MI  . Depression Brother   . Stroke Paternal Aunt     CVA  . Diabetes Paternal Uncle   . Heart disease Paternal Uncle     MI  . Heart disease Paternal Grandmother     MI  . Diabetes Cousin     PATERNAL    History   Social History  . Marital Status: Married    Spouse Name: N/A    Number of Children: N/A  . Years of Education: N/A   Occupational History  . Not on file.   Social History Main Topics  . Smoking status: Former Smoker    Types: Cigarettes    Quit date: 08/30/1974  . Smokeless tobacco: Not on file  . Alcohol Use: Yes     Comment: Occasionally; 1 small drink a week  . Drug Use: No  . Sexual Activity: Not on file   Other Topics Concern  . Not on file   Social History Narrative  . No narrative on file    REVIEW OF SYSTEMS: As per HPI.  12 system review completed  PHYSICAL EXAM: Vital signs in last 24 hours: Filed Vitals:   07/29/13 1446  BP: 100/53  Pulse: 88  Temp:   Resp:    Wt Readings from Last 3 Encounters:  07/29/13 134.1 kg (295 lb 10.2 oz)  07/28/13 135.988 kg (299 lb 12.8 oz)  07/25/13 132.042 kg (291 lb 1.6 oz)    General:  pale, ill looking obese WM.  Can not cease coughing Head:  No asymmetry or swelling.   Eyes:  No icterus or pallor Ears:  Slightly HOH  Nose:  No discharge or sneezing Mouth:  Clear, moist mm. Neck:  No JVD Lungs:  Clear bil.  No wheezing.  + persistent dry cough Heart: RRR.  No MRG Abdomen:  Soft, obese, ND, active BS.  No mass or HSM.   Rectal: stool is grrenish black, soft and trace FOBT +   Musc/Skeltl: no joint swelling or deformity Extremities:  No CCE  Neurologic:  Oriented x 3.  Moves all 4 limbs,  No gross weakness or deficits Skin:  No telangectasia Tattoos:  None Nodes:  No cervical adenopathy  Psych:  Pleasant, affect subdued in pt who is ill   LAB RESULTS:  Recent Labs  07/28/13 1130 07/29/13 0510  WBC 21.8 Repeated and verified X2.* 15.7*  HGB 9.3* 8.7*  HCT 28.2* 26.7*  PLT 478.0* 402*   BMET Lab Results  Component Value Date   NA 139 07/29/2013   NA 135 07/28/2013   NA 144 07/25/2013   K 3.7 07/29/2013   K 3.9 07/28/2013   K 3.5* 07/25/2013   CL 102 07/29/2013   CL 100 07/28/2013   CL 105 07/25/2013   CO2 21 07/29/2013   CO2 22 07/28/2013   CO2 27 07/25/2013   GLUCOSE 120*  07/29/2013   GLUCOSE 138* 07/28/2013   GLUCOSE 105* 07/25/2013   BUN 27* 07/29/2013   BUN 28* 07/28/2013   BUN 20 07/25/2013   CREATININE 1.17 07/29/2013   CREATININE 1.4 07/28/2013   CREATININE 1.11 07/25/2013   CALCIUM 8.1* 07/29/2013   CALCIUM 8.0* 07/28/2013   CALCIUM 8.3* 07/25/2013   LFT  Recent Labs  07/28/13 1130  PROT 6.3  ALBUMIN 2.7*  AST 359*  ALT 224*  ALKPHOS 382*  BILITOT 1.3*  BILIDIR 0.6*    Ref. Range 07/22/2013 05:31 07/24/2013 02:55 07/25/2013 05:53 07/28/2013 11:30  AST Latest Range: 0-37 U/L 41 (H)   359 (H)  ALT Latest Range: 0-53 U/L 70 (H)   224 (H)  Total Protein Latest Range: 6.0-8.3 g/dL 5.4 (L)   6.3  Bilirubin, Direct Latest Range: 0.0-0.3 mg/dL    0.6 (H)  Total Bilirubin Latest Range: 0.3-1.2 mg/dL 0.4   1.3 (H)  GFR Latest Range: >60.00 mL/min     52.33 (L)      PT/INR No results found for this basename: INR, PROTIME   Hepatitis Panel  Recent Labs  07/29/13 0126  HEPBSAG NEGATIVE  HCVAB NEGATIVE  HEPAIGM NON REACTIVE  HEPBIGM NON REACTIVE     RADIOLOGY STUDIES: Dg Chest 2 View 07/28/2013   CLINICAL DATA:  Chest congestion.  Short of breath.  EXAM: CHEST  2 VIEW  COMPARISON:  07/22/2013  FINDINGS: Cardiac silhouette is normal in size and configuration. Normal mediastinal and hilar contours.  Clear lungs.  No pleural effusion.  No pneumothorax.  Bony thorax is intact.  IMPRESSION: No acute cardiopulmonary disease.   Electronically Signed   By: Lajean Manes M.D.   On: 07/28/2013 13:21   US Abdomen Limited Ruq 07/29/2013  FINDINGS: Gallbladder:  The gallbladder is surgically absent.  Common bile duct:  Diameter: 5.5 mm.  Liver:  The hepatic echotexture is heterogeneous. No discrete mass is demonstrated. There is no intrahepatic ductal dilation.  IMPRESSION: Heterogeneous echotexture of the liver may reflect fatty infiltration. Given the elevated hepatic function studies, abdominal CT scanning or MRI may be useful next steps.   Electronically Signed   By: David  Martinique   On: 07/29/2013 01:55    ENDOSCOPIC STUDIES: 07/22/13  EGD INDICATIONS: Melena. ENDOSCOPIC IMPRESSION:  1. Superficial gastric ulcer as the cause for minor upper GI bleed.  No active bleeding  2. Status post lap band surgery  3. History of H. pylori positivity. Question treated. Status post  CLO biopsy today.  RECOMMENDATIONS:  1. Continue PPI twice a day for 2 weeks then once daily  indefinitely  2. Rx CLO if positive  3. Okay to resume anticoagulation   10/2008 Colonoscopy  Screening study  ENDOSCOPIC IMPRESSION:  1) Two polyps in the cecum -removed  2) Severe diverticulosis in the left colon  3) Internal hemorrhoids  RECOMMENDATIONS:  1) Repeat colonoscopy in 5 years if polyp adenomatous; otherwise 10 years   IMPRESSION:   *  FOBT +.  Recent GIB  in setting of Xarelto therapy.  Stools chronically dark while takeing MVI with Iron.  EGD 4/21 with superficial gastric ulcer.  On PPI at home, now BID IV Protonix.  H Pylori + serum and breath test last year. Clo biopsy obtained 4/17 was not processed due to "laboratory error".  This has never treated.   *  Normocytic anemia.  Hgb not much worse than at discharge 4/27.   Received 2 units PRBCs last week.   *  Elevated  LFTs without jaundice.  Had declined last week, up again today. Acute hepatitis panel last week negative. GB is out. No abdominal pain. Fatty liver on ultrasound. Wonder if it is Colchicine or some other medication causing abnormal LFTs?  Has not used colchicine after AM dose Monday 4/27. It was started during recent admission.   *  Worsening cough and dyspnea.   *  Leukocytosis. No fever. No ABX currently. Declining spontaneously  *  Hx PE.    Atrial fibrillation, remains in NSR.  Xarelto held briefly last week but restarted at discharge. Is again on hold  *  4/17 pericardial window, drainage of pericardial effusion felt to be viral in origin.  On Colchicine.   *  04/08/13 lap band and small HH repair (NOT A WRAP), steady weight loss since. Has not had fluid infused into the lap band since initial placement.  *  Chronic constipation, slightly better with daily colace and Miralax.    PLAN:     *  May need colonoscopy *  MD is contemplating IVC filter. *  CMET and CBC in AM.    Vena Rua  07/29/2013, 3:04 PM Pager: [place] Attending  I have also seen and assessed the patient and agree with the above note.   1) I cancelled hemoccults - Judson Roch showed he is heme +. However it does not look like he has or is having a GI hemorrhage. Hgb fairly consistent from 4/23 to now despite some decline. 2) degree of fatigue and hypotension not consistent w/ the change in Hgb we do see - I think metoprolol may be the culprit. He reports problems with severe  side effects to  anti-hypertensives in past (not sure if any were beta blockers) 3) LFT increase not clear - ? If it could be at all rel;ated to pericardial window (i.e. Other causes of transaminase elevation) - check ck 3) will f/u tomorrow  Gatha Mayer, MD, [facility] [phone] (pager) 07/29/2013 6:58 PM

## 2013-07-29 NOTE — ED Notes (Signed)
Hospitalist MD at bedside. 

## 2013-07-29 NOTE — Progress Notes (Signed)
Pt BP 88/57 in right arm.  MD made aware, no orders at this time. Call light in reach, RN will continue to monitor.    Nolon Nations, RN

## 2013-07-29 NOTE — Evaluation (Signed)
Physical Therapy Evaluation Patient Details Name: [name] MRN: [id number] DOB: Jan 05, 1946 Today's Date: 07/29/2013   History of Present Illness  [name] is a 68 y.o. male who returns to the ED with progressively worsening fatigue since his discharge on 4/24.  He was recently hospitalized from 4/15 to 4/24 for pericarditis and had a pericardial window at that time.  Cultures of the pericardial fluid haven't grown anything out a this point.  He reports BP consistently in the 80s when checked at home.  Fatigue is worse with any activity, however he is feeling ok as long as he is lying down in bed.  During his hospital stay amlodipine was stopped and metoprolol was added to his med list.  No chest pain, no vomiting, no syncope, no diarrhea.  Seen by Dr. [name] earlier today and was found to have significantly elevated WBC of 21k and elevated LFTs  Clinical Impression  Pt admitted with/for fatigue and possible GIB.  Pt currently limited functionally due to the problems listed below.  (see problems list.)  Pt will benefit from PT to maximize function and safety to be able to get home safely with available assist of family.     Follow Up Recommendations No PT follow up;Supervision for mobility/OOB    Equipment Recommendations  None recommended by PT    Recommendations for Other Services       Precautions / Restrictions Precautions Precautions: Fall      Mobility  Bed Mobility Overal bed mobility: Modified Independent                Transfers Overall transfer level: Modified independent   Transfers: Sit to/from Stand Sit to Stand: Modified independent (Device/Increase time)            Ambulation/Gait Ambulation/Gait assistance: Supervision Ambulation Distance (Feet): 15 Feet (times 2) Assistive device: None Gait Pattern/deviations: Step-through pattern Gait velocity: Somewhat decreased   General Gait Details: mild unsteadiness with lightheadedness and  dyspnea with little exertion  Stairs            Wheelchair Mobility    Modified Rankin (Stroke Patients Only)       Balance Overall balance assessment: Needs assistance Sitting-balance support: Feet supported;No upper extremity supported Sitting balance-Leahy Scale: Fair     Standing balance support: No upper extremity supported;During functional activity Standing balance-Leahy Scale: Fair                               Pertinent Vitals/Pain     Home Living Family/patient expects to be discharged to:: Private residence Living Arrangements: Spouse/significant other Available Help at Discharge: Family;Available 24 hours/day Type of Home: House Home Access: Stairs to enter Entrance Stairs-Rails: Psychiatric nurse of Steps: 8 Home Layout: One level Home Equipment: Walker - 2 wheels;Cane - quad      Prior Function Level of Independence: Independent               Hand Dominance   Dominant Hand: Right    Extremity/Trunk Assessment   Upper Extremity Assessment: Defer to OT evaluation           Lower Extremity Assessment: Overall WFL for tasks assessed ([name] hip flexors at >=4+/5)      Cervical / Trunk Assessment: Normal  Communication   Communication: No difficulties  Cognition Arousal/Alertness: Awake/alert Behavior During Therapy: WFL for tasks assessed/performed Overall Cognitive Status: Within Functional Limits for tasks assessed  General Comments      Exercises        Assessment/Plan    PT Assessment Patient needs continued PT services  PT Diagnosis Other (comment) (decr activity tolerance)   PT Problem List Decreased activity tolerance;Decreased mobility;Cardiopulmonary status limiting activity  PT Treatment Interventions Stair training;Gait training;Functional mobility training;Therapeutic activities;Patient/family education   PT Goals (Current goals can be found in the Care  Plan section) Acute Rehab PT Goals Patient Stated Goal: To start exercising with cardiac rehab Time For Goal Achievement: 08/05/13 Potential to Achieve Goals: Good    Frequency Min 2X/week   Barriers to discharge        Co-evaluation               End of Session Equipment Utilized During Treatment: Oxygen Activity Tolerance: Patient tolerated treatment well;Patient limited by fatigue Patient left: with call bell/phone within reach (sitting EOB) Nurse Communication: Mobility status         Time: 2585-2778 PT Time Calculation (min): 21 min   Charges:   PT Evaluation $Initial PT Evaluation Tier I: 1 Procedure PT Treatments $Gait Training: 8-22 mins   PT G CodesTessie Fass Jacquette Canales 07/29/2013, 5:26 PM 07/29/2013  Donnella Sham, PT [phone]  (pager)

## 2013-07-29 NOTE — ED Notes (Signed)
Pt ok to transport to floor; MD aware of pt increased HR and converting to A-fib; Md will order meds once on the floor; Floor RN notified of pt ready to transport up.

## 2013-07-29 NOTE — Progress Notes (Signed)
Pt states he feels exhausted and has no energy, "not even enough energy to look at my breakfast" Will continue to monitor pt [name] Rhymes, RN

## 2013-07-29 NOTE — Progress Notes (Signed)
Spoke with Dr. Coralyn Pear concerning worry about bolusing patient with fluid, Dr. Coralyn Pear stated to continue with bolus and watch pt closely, will continue to monitor pt [name] Rhymes, RN

## 2013-07-29 NOTE — ED Notes (Signed)
Hosptialist paged; pt started converting to A-Fib just prior to floor transfer; HR elevated to mid 120s

## 2013-07-29 NOTE — Progress Notes (Addendum)
[facility] PROGRESS NOTE  [name] JME:[id number] DOB: [birth year] DOA: 07/28/2013 PCP: [name], MD  Assessment/Plan: 1. Suspected upper GI bleed.  -Patient presently on anticoagulation for recent diagnosis of pulmonary embolism. -Had upper GI bleed on recent hospitalization, undergoing EGD, procedure performed by Dr. Henrene Pastor which showed superficial gastric ulcers. Patient was discharged on Protonix 40 mg by mouth twice a day -Patient presenting with hypotension, orthostasis, having an episode of black tarry stools -I am repeating H&H, checking stool for Hemoccult, stopping Xarelto -Case was discussed with Dr Carlean Purl of GI who agreed with stopping Xarelto. He may need a colonoscpoy  2. Hypotension -I suspect secondary to hypovolemia which could be caused by GI bleed. Patient had a black tarry stools this morning -Patient is orthostatic as he had a 20 point drop in systolic blood pressure going from laying to standing.  Continue IV fluid resuscitation -Blood pressures improved, monitor closely  3. History of pulmonary embolism. -Patient recently diagnosed with PE on his last hospitalization started on Xarelto. -CT scan revealed small clot burden -I don't think PE causing hemodynamic compromise, no evidence of right heart strain -Xarelto has been held -He may need IVC filter placement  4. History of large pericardial effusion in setting of pericarditis -Repeat transthoracic echocardiogram today showed small circumferential pericardial effusion. -Continue colchicine  5. Generalized weakness/fatigue -I am concerned with the possibility of underlying GI bleed, although hemoglobin this morning at 8.7, little change from last hospitalization -Will repeat hemoglobin this afternoon, checking Stool Hemoccult -Continue IV fluid hydration with normal saline  6. Elevated transaminases -Possibilities include shock liver in setting of hypotension -Will check right upper  quadrant ultrasound  7. Leukocytosis -Unclear etiology, patient afebrile, chest x-ray and UA negative -Followup on right upper quadrant ultrasound -Hold antibiotics for now    Code Status: Full Code Family Communication: Spoke with patient's wife at bedside Disposition Plan: Continue supportive care, IV fluids, stopping Xarelto   Consultants:  Cardiology  GI   HPI/Subjective: Patient is a pleasant 68 year old gentleman who was recently discharged from my service on 07/25/2013. Patient was admitted on 07/16/2013 presenting with shortness of breath and chest pain. CT scan showed subsegmental pulmonary emboli as well as a small to moderate pericardial effusion. There was concern for pericarditis. A transthoracic echocardiogram performed on 07/17/2013 demonstrated a moderate to large pericardial effusion without evidence of hemodynamic compromise. Cardiology and cardiothoracic surgery were consulted. He was started on colchicine for presumed pericarditis. Patient underwent pericardial window. During this time he was anticoagulated with Xarelto. That hospitalization was complicated by the development of GI bleed. Dr. area of gastroenterology was consulted as patient underwent endoscopy on 07/22/2013. Procedure demonstrated superficial gastric ulcer. Patient was restarted on his Xarelto. He did not have further episodes of GI bleed and was discharged in stable condition on 07/25/2013. He presented to the emergency room on 07/29/2013 with complaints of generalized weakness, fatigue, poor tolerance to physical exertion. He was found to be hypotensive. On his last hospitalization he was discharged on low-dose metoprolol 12.5 mg by mouth twice a day. While in the hospital he was noted to have a black/tarry bowel movement. Patient appears pale. Suspect GI bleed as cause of generalized weakness.  Objective: Filed Vitals:   07/29/13 1446  BP: 100/53  Pulse: 88  Temp:   Resp:     Intake/Output  Summary (Last 24 hours) at 07/29/13 1501 Last data filed at 07/29/13 1300  Gross per 24 hour  Intake    240 ml  Output    400 ml  Net   -160 ml   Filed Weights   07/29/13 0229  Weight: 134.1 kg (295 lb 10.2 oz)    Exam:   General:  Patient appears ill, pale, low energy  Cardiovascular: Regular rate and rhythm, normal S1S2  Respiratory: Bibasilar crackles, normal inspiratory effort  Abdomen: Soft, nontender nondistended  Musculoskeletal: No edema   Data Reviewed: Basic Metabolic Panel:  Recent Labs Lab 07/24/13 0255 07/25/13 0553 07/28/13 1130 07/29/13 0510  NA 143 144 135 139  K 4.2 3.5* 3.9 3.7  CL 108 105 100 102  CO2 24 27 22 21   GLUCOSE 94 105* 138* 120*  BUN 30* 20 28* 27*  CREATININE 1.16 1.11 1.4 1.17  CALCIUM 7.8* 8.3* 8.0* 8.1*   Liver Function Tests:  Recent Labs Lab 07/28/13 1130  AST 359*  ALT 224*  ALKPHOS 382*  BILITOT 1.3*  PROT 6.3  ALBUMIN 2.7*   No results found for this basename: LIPASE, AMYLASE,  in the last 168 hours No results found for this basename: AMMONIA,  in the last 168 hours CBC:  Recent Labs Lab 07/23/13 2030 07/24/13 0255 07/25/13 0553 07/28/13 1130 07/29/13 0510  WBC  --   --  13.0* 21.8 Repeated and verified X2.* 15.7*  NEUTROABS  --   --   --  18.9*  --   HGB 7.8* 7.7* 8.6* 9.3* 8.7*  HCT 23.6* 23.5* 26.5* 28.2* 26.7*  MCV  --   --  90.8 89.6 89.6  PLT  --   --  405* 478.0* 402*   Cardiac Enzymes: No results found for this basename: CKTOTAL, CKMB, CKMBINDEX, TROPONINI,  in the last 168 hours BNP (last 3 results)  Recent Labs  07/13/13 1835  PROBNP 519.5*   CBG: No results found for this basename: GLUCAP,  in the last 168 hours  No results found for this or any previous visit (from the past 240 hour(s)).   Studies: Dg Chest 2 View  07/28/2013   CLINICAL DATA:  Chest congestion.  Short of breath.  EXAM: CHEST  2 VIEW  COMPARISON:  07/22/2013  FINDINGS: Cardiac silhouette is normal in size and  configuration. Normal mediastinal and hilar contours.  Clear lungs.  No pleural effusion.  No pneumothorax.  Bony thorax is intact.  IMPRESSION: No acute cardiopulmonary disease.   Electronically Signed   By: Lajean Manes M.D.   On: 07/28/2013 13:21   US Abdomen Limited Ruq  07/29/2013   CLINICAL DATA:  Elevated hepatic function studies  EXAM: US ABDOMEN LIMITED - RIGHT UPPER QUADRANT  COMPARISON:  US ABDOMEN LIMITED dated 07/13/2013; CT ANGIO CHEST W/CM &/OR WO/CM dated 07/16/2013; CT UROGRAM dated 11/28/2012  FINDINGS: Gallbladder:  The gallbladder is surgically absent.  Common bile duct:  Diameter: 5.5 mm.  Liver:  The hepatic echotexture is heterogeneous. No discrete mass is demonstrated. There is no intrahepatic ductal dilation.  IMPRESSION: Heterogeneous echotexture of the liver may reflect fatty infiltration. Given the elevated hepatic function studies, abdominal CT scanning or MRI may be useful next steps.   Electronically Signed   By: David  Martinique   On: 07/29/2013 01:55    Scheduled Meds: . allopurinol  300 mg Oral Q breakfast  . colchicine  0.6 mg Oral BID  . docusate sodium  100 mg Oral BID  . finasteride  5 mg Oral QHS  . gabapentin  100 mg Oral BID  . LORazepam  0.5-1 mg Oral BID  . metoprolol  tartrate  12.5 mg Oral BID  . pantoprazole (PROTONIX) IV  40 mg Intravenous Q12H  . polyethylene glycol  17 g Oral Daily  . risperiDONE  0.5 mg Oral QHS  . sertraline  150 mg Oral Q breakfast  . sodium chloride  1,000 mL Intravenous Once  . sodium chloride  3 mL Intravenous Q12H   Continuous Infusions:   Principal Problem:   Upper GI bleed Active Problems:   Hypotension, unspecified   Pulmonary thromboembolism   Pericardial effusion s/p pericardial window 07/18/13 400cc   Chronic anticoagulation   Orthostasis   H/O laparoscopic adjustable gastric banding & hiatal hernia repair 04/08/13   Hepatitis   Gastric ulcer with hemorrhage 07/20/13   Leukocytosis   PAF (paroxysmal atrial  fibrillation) with RVR   Anemia    Time spent: 40 min    [facility] Pager [phone] 7PM-7AM, please contact night-coverage at [email or web address], password [facility] 07/29/2013, 3:01 PM  LOS: 1 day

## 2013-07-29 NOTE — Consult Note (Signed)
Reason for Consult:  Fatigue associated with hypotension. PAF.  Referring Physician: Dr. Alcario [name] [facility] PCP: [name] Cobble, MD  Primary Cardiologist: [name] is an 68 y.o. male.    Chief Complaint: fatigue   HPI: 68 y.o. male who returns to the ED with progressively worsening fatigue since his discharge on 4/24. He was recently hospitalized from 4/15 to 4/24 for pulmonary embolus, pericarditis and had a pericardial window at that time. Cultures of the pericardial fluid haven't grown anything out a this point. He also had acute GI bleed with bloody stools-EGD with superficial gastric ulcer- PPI BID and resume Xarelto-monitoring for bleed.  He reports BP consistently in the 80s when checked at home. Fatigue and SOB - (at times he has felt he would pass out)  is worse with any activity, however he is feeling ok as long as he is lying down in bed. During his previous hospital stay amlodipine was stopped and metoprolol was added to his med list. No chest pain, no vomiting, no syncope, no diarrhea. Seen by [name] earlier yesterday and was found to have significantly elevated WBC of 21k and elevated LFTs which had improved with previous hospitalization.  (normal LFTs in 04/2013 before AP-large lap band surgery but beginning 07/2013 began rising).  Also with PAF with RVR during most recent hospitalization.   Past medical history significant for bariatric surgery 1/2015with recent 85 lb weight loss, HTN, obesity, OSA, gout, depression and BPH. He had a recent severe viral illness about 3 months ago. He was recently seen with persistent cough, wheezing and dyspnea and there was chest pain concerning for possible PE. He was anticoagulated and given lasix and CTA + PE, pericardial effusion and pl effusions.   Now with H/H 8.7/26.7 ( slow drift down after 2 units last admit, now up to 8.6)  WBC 15.7 down from 21.8 yesterday-- LFTS T. Bili 1.3; direct 0.6; alk phos 382 up from  221 in 1 week; AST 359 up from 41 in 1 week; ALT 224 up from 70 in 1 week. Negative acute Hepatitis panel.  BP 975-88 systolic.  He did have PAF last pm as well with RVR.  Now in SR.  EKG with t wave abnormality in II,III,AVF and ant lat leads similar to previous tracings since 03/2013.  No chest pain. Pt without cardiac hx, but + family hx of CAD with father.  2D Echo: 07/29/13 Study Conclusions  - Left ventricle: The cavity size was normal. Systolic function was normal. The estimated ejection fraction was in the range of 60% to 65%. - Inferior vena cava: The vessel was normal in size; the respirophasic diameter changes were in the normal range (= 50%); findings are consistent with normal central venous pressure. - Pericardium, extracardiac: Small cirumferential pericardial effusion. There appears to be a gelatinous material overlying the apex of the heart in the pericardium which may be thrombus, epicardial fat or tumor. No signs of tamponade physiology. Impressions:  - Compared to the prior recent echo on 07/17/13, there has been a small decrease in pericardial fluid.  2D Echo: 07/17/13 Study Conclusions - Left ventricle: The cavity size was normal. There was mild concentric hypertrophy. Systolic function was normal. The estimated ejection fraction was in the range of 55% to 60%. Wall motion was normal; there were no regional wall motion abnormalities. - Aortic root: The aortic root was mildly dilated. - Mitral valve: Mild regurgitation. - Left atrium: The  atrium was moderately to severely dilated. - Right atrium: The atrium was mildly to moderately dilated. - Pericardium, extracardiac: A moderate to large pericardial effusion was identified. There was no evidence of hemodynamic compromise.    Past Medical History  Diagnosis Date  . Benign prostatic hypertrophy     several prostate biopsies neg for cancer.   . Hypertension   . Gout   . Depression   . FH: colonic polyps  2010  . Morbid obesity   . Complication of anesthesia     MORPHINE CAUSES HALLUCINATIONS  . Asthma     childhood asthma  . Numbness     feet / legs  . History of kidney stones   . Kidney cysts   . Sleep apnea     USES C PAP  . Bilateral hip pain   . PAF (paroxysmal atrial fibrillation) with RVR 07/29/2013  . Anemia 07/29/2013    Past Surgical History  Procedure Laterality Date  . Total knee replacment      bilat  . Cholecystectomy  1989  . Trigger finger repair      also lue, cts surgically repaired  . Renal calculi    . Tonsillectomy    . Lithotripsy    . Arthroscopy knee w/ drilling    . Breath tek h pylori N/A 12/23/2012    Procedure: BREATH TEK H PYLORI;  Surgeon: [name] Curry, MD;  Location: [facility];  Service: General;  Laterality: N/A;  . Wisdom tooth extraction    . Laparoscopic gastric banding with hiatal hernia repair N/A 04/08/2013    Procedure: LAPAROSCOPIC GASTRIC BANDING WITH possible  HIATAL HERNIA REPAIR;  Surgeon: [name] Curry, MD;  Location: [facility];  Service: General;  Laterality: N/A;  . Subxyphoid pericardial window N/A 07/18/2013    Procedure: SUBXYPHOID PERICARDIAL WINDOW;  Surgeon: [name] Isaac, MD;  Location: [facility];  Service: Thoracic;  Laterality: N/A;  . Intraoperative transesophageal echocardiogram N/A 07/18/2013    Procedure: INTRAOPERATIVE TRANSESOPHAGEAL ECHOCARDIOGRAM;  Surgeon: [name] Isaac, MD;  Location: [place];  Service: Open Heart Surgery;  Laterality: N/A;  . Esophagogastroduodenoscopy N/A 07/22/2013    Procedure: ESOPHAGOGASTRODUODENOSCOPY (EGD);  Surgeon: [name] Shipper, MD;  Location: [facility];  Service: Endoscopy;  Laterality: N/A;    Family History  Problem Relation Age of Onset  . Depression Mother   . Hypertension Mother   . Heart disease Father     MI  . Depression Brother   . Stroke Paternal Aunt     CVA  . Diabetes Paternal Uncle   . Heart disease Paternal Uncle     MI  . Heart disease Paternal Grandmother       MI  . Diabetes Cousin     PATERNAL   Social History:  reports that he quit smoking about 38 years ago. His smoking use included Cigarettes. He smoked 0.00 packs per day. He does not have any smokeless tobacco history on file. He reports that he drinks alcohol. He reports that he does not use illicit drugs.  Allergies:  Allergies  Allergen Reactions  . Propoxyphene N-Acetaminophen Nausea Only  . Codeine     nausea  . Morphine     Nausea Severe hallucinations  . Penicillins     LOC with shot    Medications Prior to Admission  Medication Sig Dispense Refill  . Cyanocobalamin (VITAMIN B-12) 1000 MCG SUBL Place 1,000 mcg under the tongue every morning.      [name] Dextromethorphan-Guaifenesin (  ROBITUSSIN DM PO) Take 15 mLs by mouth daily as needed (cough).      . docusate sodium 100 MG CAPS Take 100 mg by mouth 2 (two) times daily.  10 capsule  0  . finasteride (PROSCAR) 5 MG tablet Take 5 mg by mouth daily.       . furosemide (LASIX) 20 MG tablet Take 1 tablet (20 mg total) by mouth daily.  30 tablet  1  . LORazepam (ATIVAN) 0.5 MG tablet Take 0.5-1 mg by mouth 2 (two) times daily. 0.5 mg in the morning and 1 mg at bedtime      . metoprolol tartrate (LOPRESSOR) 25 MG tablet Take 12.5 mg by mouth 2 (two) times daily.      . Multiple Vitamin (MULTIVITAMIN) tablet Take 1 tablet by mouth daily. Chewable tablet with iron      . pantoprazole (PROTONIX) 40 MG tablet Take 1 tablet (40 mg total) by mouth 2 (two) times daily.  60 tablet  1  . polyethylene glycol (MIRALAX / GLYCOLAX) packet Take 17 g by mouth daily.  14 each  0  . risperiDONE (RISPERDAL) 0.5 MG tablet Take 1 tablet (0.5 mg total) by mouth at bedtime.  30 tablet  2  . rivaroxaban (XARELTO) 20 MG TABS tablet Take 1 tablet (20 mg total) by mouth daily with supper.  30 tablet  1  . sertraline (ZOLOFT) 100 MG tablet Take 1.5 tablets (150 mg total) by mouth daily with breakfast.  45 tablet  2  . Tetrahydrozoline HCl (VISINE OP) Place 2  drops into both eyes daily as needed (dry eyes).        Results for orders placed during the hospital encounter of 07/28/13 (from the past 48 hour(s))  LACTIC ACID, PLASMA     Status: None   Collection Time    07/28/13  9:59 PM      Result Value Ref Range   Lactic Acid, Venous 1.3  0.5 - 2.2 mmol/L  URINALYSIS, ROUTINE W REFLEX MICROSCOPIC     Status: Abnormal   Collection Time    07/28/13 11:03 PM      Result Value Ref Range   Color, Urine AMBER (*) YELLOW   Comment: BIOCHEMICALS MAY BE AFFECTED BY COLOR   APPearance CLEAR  CLEAR   Specific Gravity, Urine 1.024  1.005 - 1.030   pH 5.0  5.0 - 8.0   Glucose, UA NEGATIVE  NEGATIVE mg/dL   Hgb urine dipstick NEGATIVE  NEGATIVE   Bilirubin Urine SMALL (*) NEGATIVE   Ketones, ur NEGATIVE  NEGATIVE mg/dL   Protein, ur NEGATIVE  NEGATIVE mg/dL   Urobilinogen, UA 1.0  0.0 - 1.0 mg/dL   Nitrite NEGATIVE  NEGATIVE   Leukocytes, UA NEGATIVE  NEGATIVE   Comment: MICROSCOPIC NOT DONE ON URINES WITH NEGATIVE PROTEIN, BLOOD, LEUKOCYTES, NITRITE, OR GLUCOSE <1000 mg/dL.  HEPATITIS PANEL, ACUTE     Status: None   Collection Time    07/29/13  1:26 AM      Result Value Ref Range   Hepatitis B Surface Ag NEGATIVE  NEGATIVE   HCV Ab NEGATIVE  NEGATIVE   Hep A IgM NON REACTIVE  NON REACTIVE   Hep B C IgM NON REACTIVE  NON REACTIVE   Comment: (NOTE)     High levels of Hepatitis B Core IgM antibody are detectable     during the acute stage of Hepatitis B. This antibody is used     to differentiate current from past  HBV infection.     Performed at [facility]  CBC     Status: Abnormal   Collection Time    07/29/13  5:10 AM      Result Value Ref Range   WBC 15.7 (*) 4.0 - 10.5 K/uL   RBC 2.98 (*) 4.22 - 5.81 MIL/uL   Hemoglobin 8.7 (*) 13.0 - 17.0 g/dL   HCT 26.7 (*) 39.0 - 52.0 %   MCV 89.6  78.0 - 100.0 fL   MCH 29.2  26.0 - 34.0 pg   MCHC 32.6  30.0 - 36.0 g/dL   RDW 16.2 (*) 11.5 - 15.5 %   Platelets 402 (*) 150 - 400 K/uL    BASIC METABOLIC PANEL     Status: Abnormal   Collection Time    07/29/13  5:10 AM      Result Value Ref Range   Sodium 139  137 - 147 mEq/L   Potassium 3.7  3.7 - 5.3 mEq/L   Chloride 102  96 - 112 mEq/L   CO2 21  19 - 32 mEq/L   Glucose, Bld 120 (*) 70 - 99 mg/dL   BUN 27 (*) 6 - 23 mg/dL   Creatinine, Ser 1.17  0.50 - 1.35 mg/dL   Calcium 8.1 (*) 8.4 - 10.5 mg/dL   GFR calc non Af Amer 63 (*) >90 mL/min   GFR calc Af Amer 73 (*) >90 mL/min   Comment: (NOTE)     The eGFR has been calculated using the CKD EPI equation.     This calculation has not been validated in all clinical situations.     eGFR's persistently <90 mL/min signify possible Chronic Kidney     Disease.   Dg Chest 2 View  07/28/2013   CLINICAL DATA:  Chest congestion.  Short of breath.  EXAM: CHEST  2 VIEW  COMPARISON:  07/22/2013  FINDINGS: Cardiac silhouette is normal in size and configuration. Normal mediastinal and hilar contours.  Clear lungs.  No pleural effusion.  No pneumothorax.  Bony thorax is intact.  IMPRESSION: No acute cardiopulmonary disease.   Electronically Signed   By: Lajean Manes M.D.   On: 07/28/2013 13:21   US Abdomen Limited Ruq  07/29/2013   CLINICAL DATA:  Elevated hepatic function studies  EXAM: US ABDOMEN LIMITED - RIGHT UPPER QUADRANT  COMPARISON:  US ABDOMEN LIMITED dated 07/13/2013; CT ANGIO CHEST W/CM &/OR WO/CM dated 07/16/2013; CT UROGRAM dated 11/28/2012  FINDINGS: Gallbladder:  The gallbladder is surgically absent.  Common bile duct:  Diameter: 5.5 mm.  Liver:  The hepatic echotexture is heterogeneous. No discrete mass is demonstrated. There is no intrahepatic ductal dilation.  IMPRESSION: Heterogeneous echotexture of the liver may reflect fatty infiltration. Given the elevated hepatic function studies, abdominal CT scanning or MRI may be useful next steps.   Electronically Signed   By: David  Martinique   On: 07/29/2013 01:55    ROS: General:no colds or fevers, weight loss since last spring of  100 lbs. Skin:no rashes or ulcers HEENT:no blurred vision, no congestion CV:see HPI PUL:see HPI GI:no diarrhea constipation + dark stools, no indigestion GU:no hematuria, no dysuria MS:no joint pain, no claudication Neuro:near syncope, + lightheadedness Endo:no diabetes, no thyroid disease   Blood pressure 100/58, pulse 74, temperature 98.4 F (36.9 C), temperature source Oral, resp. rate 21, height 6' (1.829 m), weight 295 lb 10.2 oz (134.1 kg), SpO2 97.00%. PE: General:Pleasant affect, NAD, stable lying in the bed Skin:Warm and dry,  brisk capillary refill HEENT:normocephalic, sclera clear, mucus membranes moist Neck:supple, no JVD, no bruits, no adenopathy  Heart:S1S2 RRR without murmur, gallup, rub or click Lungs:clear without rales, rhonchi, or wheezes GCY:OYOOJ, soft, non tender, + BS, do not palpate liver spleen or masses Ext:no lower ext edema, 2+ pedal pulses, 2+ radial pulses Neuro:alert and oriented, MAE, follows commands, + facial symmetry    Assessment/Plan Principal Problem:   Hypotension Active Problems:   PAF (paroxysmal atrial fibrillation) with RVR- episode last pm, not receiving lopressor due to hypotension   Pulmonary thromboembolism--on Xarelto   Pericardial effusion s/p pericardial window 07/18/13 400cc- minimal residual effusion on echo 07/29/13   SLEEP APNEA with c pap   H/O laparoscopic adjustable gastric banding & hiatal hernia repair 04/08/13   Hepatitis   Chronic anticoagulation-Xarelto- would hold if actively bleeding    Gastric ulcer with hemorrhage 07/20/13--PPI BID- now with dark stools, check for hemoccult - GI eval   Leukocytosis   Anemia-some improvement was on Iron at home but H/H still low.    DOE with any exertion.  MD to see for further recommendations.  [name]  Nurse Practitioner Certified [place] Pager [phone] or after 5pm or weekends call [phone] 07/29/2013, 2:15 PM   Patient seen and examined with  [name] Kicks, NP. We discussed all aspects of the encounter. I agree with the assessment and plan as stated above.   68 y/o male as above with morbid obesity s/p Lap-band admitted last week with small PE, GIB and pericardial effusion requiring drainage now presents with recurrent fatigue and hypotension as well as PAF.   I have reviewed echo and he has minimal residual effusion with normal RV and LV function. (No RV strain). SBP checked personally is hovering around 90. Continues with black BMs but is on iron.   My suspicion is that he may have recurrent GIB. Doubt his symptoms are cardiac in nature. Would continue to hydrate and w/u for GIB. Can hold Xarelto for now. (if has recurrent GIB may need IVC filter). Back in NSR. Holding Xarelto for GIB. Etiology of effusion felt to be related to viral pericarditis.  Will check ESR.  We will follow at a distance.   [name] Pascal Bensimhon,MD 2:40 PM

## 2013-07-29 NOTE — Progress Notes (Signed)
  Echocardiogram 2D Echocardiogram limited has been performed.  [place] 07/29/2013, 10:11 AM

## 2013-07-29 NOTE — Progress Notes (Signed)
UR completed. Login Muck[place]oy RN CCM Case Mgmt phone [phone] 

## 2013-07-29 NOTE — Progress Notes (Addendum)
INITIAL NUTRITION ASSESSMENT  DOCUMENTATION CODES Per approved criteria  -Obesity Unspecified   INTERVENTION: - Encouraged increased meal intake to help with pt's fatigue - Recommend MD allow pt to bring in protein shakes from home - Multivitamin 1 tablet PO daily - Recommend MD order vitamin B-12 per pt's home regimen - RD to continue to monitor   NUTRITION DIAGNOSIS: Predicted suboptimal energy intake related to poor appetite as evidenced by pt report.   Goal: Pt to consume >90% of meals  Monitor:  Weights, labs, intake  Reason for Assessment: Malnutrition screening tool   68 y.o. male  Admitting Dx: Hypotension  ASSESSMENT: Pt is a 68 y.o. male who returns to the ED with progressively worsening fatigue since his discharge on 07/25/13. He was recently hospitalized from 4/15 to 4/24 for pericarditis and had a pericardial window at that time. Admitted with fatigue, found to have hypotension. Hx of gastric band surgery January 2015. Has been followed by outpatient bariatric RD. MD suspects pt with possible GI bleed.   Met with pt, wife, and son. Pt reports poor appetite with minimal intake over the weekend due to fatigue. Before then he was eating basically a heart healthy diet of things like sweet potatoes, fruit, vegetables, and drinking water 30 minutes before or after meals. Also recently ate 2 small hamburgers. Drinks 16 ounces of Premiere Protein shake/day and uses sugar free coffee creamer in decaf coffee. C/o ongoing cough since surgery. Was able to eat some peaches and sweet potatoes today and had some orange juice.   Discussed possibility of pt bringing in protein shakes from home with MD as pt did not want to try any protein shakes here, however cardiologist stated to wait and hear from surgery before pt brings in protein shakes.   Alk phos, AST/ALT and total bilirubin elevated   Height: Ht Readings from Last 1 Encounters:  07/29/13 6' (1.829 m)    Weight: Wt  Readings from Last 1 Encounters:  07/29/13 295 lb 10.2 oz (134.1 kg)    Ideal Body Weight: 178 lbs  % Ideal Body Weight: 166%  Wt Readings from Last 10 Encounters:  07/29/13 295 lb 10.2 oz (134.1 kg)  07/28/13 299 lb 12.8 oz (135.988 kg)  07/25/13 291 lb 1.6 oz (132.042 kg)  07/25/13 291 lb 1.6 oz (132.042 kg)  07/25/13 291 lb 1.6 oz (132.042 kg)  07/15/13 307 lb (139.254 kg)  07/10/13 305 lb 8 oz (138.574 kg)  07/03/13 311 lb 6.4 oz (141.25 kg)  06/05/13 313 lb (141.976 kg)  06/03/13 319 lb 12.8 oz (145.06 kg)    Usual Body Weight: 360 lbs starting weight at [facility] in May 2014  % Usual Body Weight: 82%  BMI:  Body mass index is 40.09 kg/(m^2). Class III extreme obesity  Estimated Nutritional Needs: Kcal: 1850-2050 (for ongoing weight loss) Protein: 80-100g Fluid: per MD  Skin: Non-pitting RLE, LLE edema,+1 generalized edema  Diet Order: Cardiac  EDUCATION NEEDS: -No education needs identified at this time   Intake/Output Summary (Last 24 hours) at 07/29/13 1443 Last data filed at 07/29/13 0600  Gross per 24 hour  Intake      0 ml  Output    400 ml  Net   -400 ml    Last BM: 4/27  Labs:   Recent Labs Lab 07/25/13 0553 07/28/13 1130 07/29/13 0510  NA 144 135 139  K 3.5* 3.9 3.7  CL 105 100 102  CO2 27 22 21  BUN 20 28* 27*  CREATININE 1.11 1.4 1.17  CALCIUM 8.3* 8.0* 8.1*  GLUCOSE 105* 138* 120*    CBG (last 3)  No results found for this basename: GLUCAP,  in the last 72 hours  Scheduled Meds: . allopurinol  300 mg Oral Q breakfast  . colchicine  0.6 mg Oral BID  . docusate sodium  100 mg Oral BID  . finasteride  5 mg Oral QHS  . gabapentin  100 mg Oral BID  . LORazepam  0.5-1 mg Oral BID  . metoprolol tartrate  12.5 mg Oral BID  . pantoprazole (PROTONIX) IV  40 mg Intravenous Q12H  . polyethylene glycol  17 g Oral Daily  . risperiDONE  0.5 mg Oral QHS  . sertraline  150 mg Oral Q breakfast  . sodium  chloride  3 mL Intravenous Q12H    Continuous Infusions:   Past Medical History  Diagnosis Date  . Benign prostatic hypertrophy     several prostate biopsies neg for cancer.   . Hypertension   . Gout   . Depression   . FH: colonic polyps 2010  . Morbid obesity   . Complication of anesthesia     MORPHINE CAUSES HALLUCINATIONS  . Asthma     childhood asthma  . Numbness     feet / legs  . History of kidney stones   . Kidney cysts   . Sleep apnea     USES C PAP  . Bilateral hip pain   . PAF (paroxysmal atrial fibrillation) with RVR 07/29/2013  . Anemia 07/29/2013    Past Surgical History  Procedure Laterality Date  . Total knee replacment      bilat  . Cholecystectomy  1989  . Trigger finger repair      also lue, cts surgically repaired  . Renal calculi    . Tonsillectomy    . Lithotripsy    . Arthroscopy knee w/ drilling    . Breath tek h pylori N/A 12/23/2012    Procedure: BREATH TEK H PYLORI;  Surgeon: Gayland Curry, MD;  Location: [facility];  Service: General;  Laterality: N/A;  . Wisdom tooth extraction    . Laparoscopic gastric banding with hiatal hernia repair N/A 04/08/2013    Procedure: LAPAROSCOPIC GASTRIC BANDING WITH possible  HIATAL HERNIA REPAIR;  Surgeon: Gayland Curry, MD;  Location: [facility];  Service: General;  Laterality: N/A;  . Subxyphoid pericardial window N/A 07/18/2013    Procedure: SUBXYPHOID PERICARDIAL WINDOW;  Surgeon: Grace Isaac, MD;  Location: [place];  Service: Thoracic;  Laterality: N/A;  . Intraoperative transesophageal echocardiogram N/A 07/18/2013    Procedure: INTRAOPERATIVE TRANSESOPHAGEAL ECHOCARDIOGRAM;  Surgeon: Grace Isaac, MD;  Location: [place];  Service: Open Heart Surgery;  Laterality: N/A;  . Esophagogastroduodenoscopy N/A 07/22/2013    Procedure: ESOPHAGOGASTRODUODENOSCOPY (EGD);  Surgeon: Irene Shipper, MD;  Location: [facility];  Service: Endoscopy;  Laterality: N/A;    Mikey College MS, [place], [place]  Pager [phone] After Hours Pager

## 2013-07-29 NOTE — ED Notes (Signed)
Phlebotomy at bedside.

## 2013-07-29 NOTE — Progress Notes (Signed)
I set up the CPAP machine at this time for the patient on a pressure of 16 per home regimen with 2 lpm o2 bleed in. Patient has home mask and tubing and says he knows how to work the machine himself and does not need any help putting his mask on and off. Patient says he will go on and off the CPAP machine as he feels. Patient is aware to call RT if he does need any help at all with the machine. RT will continue to assist as needed.

## 2013-07-29 NOTE — Progress Notes (Signed)
Pt heart rate 86 and in normal sinus rhythm.  Pt is resting in bed with call light in reach.  RN will continue to monitor.   Nolon Nations, RN

## 2013-07-29 NOTE — H&P (Signed)
[facility] History and Physical  [name] TGG:[id number] DOB: 02/18/1946 DOA: 07/28/2013  Referring physician: EDP PCP: [name], MD   Chief Complaint: Fatigue   HPI: [name] is a 68 y.o. male who returns to the ED with progressively worsening fatigue since his discharge on 4/24.  He was recently hospitalized from 4/15 to 4/24 for pericarditis and had a pericardial window at that time.  Cultures of the pericardial fluid haven't grown anything out a this point.  He reports BP consistently in the 80s when checked at home.  Fatigue is worse with any activity, however he is feeling ok as long as he is lying down in bed.  During his hospital stay amlodipine was stopped and metoprolol was added to his med list.  No chest pain, no vomiting, no syncope, no diarrhea.  Seen by Dr. [name] earlier today and was found to have significantly elevated WBC of 21k and elevated LFTs.  Review of Systems: Systems reviewed.  As above, otherwise negative  Past Medical History  Diagnosis Date  . Benign prostatic hypertrophy     several prostate biopsies neg for cancer.   . Hypertension   . Gout   . Depression   . FH: colonic polyps 2010  . Morbid obesity   . Complication of anesthesia     MORPHINE CAUSES HALLUCINATIONS  . Asthma     childhood asthma  . Numbness     feet / legs  . History of kidney stones   . Kidney cysts   . Sleep apnea     USES C PAP  . Bilateral hip pain    Past Surgical History  Procedure Laterality Date  . Total knee replacment      bilat  . Cholecystectomy  1989  . Trigger finger repair      also lue, cts surgically repaired  . Renal calculi    . Tonsillectomy    . Lithotripsy    . Arthroscopy knee w/ drilling    . Breath tek h pylori N/A 12/23/2012    Procedure: BREATH TEK H PYLORI;  Surgeon: Gayland Curry, MD;  Location: [facility];  Service: General;  Laterality: N/A;  . Wisdom tooth extraction    . Laparoscopic gastric banding with  hiatal hernia repair N/A 04/08/2013    Procedure: LAPAROSCOPIC GASTRIC BANDING WITH possible  HIATAL HERNIA REPAIR;  Surgeon: Gayland Curry, MD;  Location: [facility];  Service: General;  Laterality: N/A;  . Subxyphoid pericardial window N/A 07/18/2013    Procedure: SUBXYPHOID PERICARDIAL WINDOW;  Surgeon: Grace Isaac, MD;  Location: [place];  Service: Thoracic;  Laterality: N/A;  . Intraoperative transesophageal echocardiogram N/A 07/18/2013    Procedure: INTRAOPERATIVE TRANSESOPHAGEAL ECHOCARDIOGRAM;  Surgeon: Grace Isaac, MD;  Location: [place];  Service: Open Heart Surgery;  Laterality: N/A;  . Esophagogastroduodenoscopy N/A 07/22/2013    Procedure: ESOPHAGOGASTRODUODENOSCOPY (EGD);  Surgeon: Irene Shipper, MD;  Location: [facility];  Service: Endoscopy;  Laterality: N/A;   Social History:  reports that he quit smoking about 38 years ago. His smoking use included Cigarettes. He smoked 0.00 packs per day. He does not have any smokeless tobacco history on file. He reports that he drinks alcohol. He reports that he does not use illicit drugs.  Allergies  Allergen Reactions  . Propoxyphene N-Acetaminophen Nausea Only  . Codeine     nausea  . Morphine     Nausea Severe hallucinations  . Penicillins     LOC  with shot    Family History  Problem Relation Age of Onset  . Depression Mother   . Hypertension Mother   . Heart disease Father     MI  . Depression Brother   . Stroke Paternal Aunt     CVA  . Diabetes Paternal Uncle   . Heart disease Paternal Uncle     MI  . Heart disease Paternal Grandmother     MI  . Diabetes Cousin     PATERNAL     Prior to Admission medications   Medication Sig Start Date End Date Taking? Authorizing Provider  allopurinol (ZYLOPRIM) 300 MG tablet Take 300 mg by mouth daily with breakfast.    Yes Historical Provider, MD  colchicine 0.6 MG tablet Take 1 tablet (0.6 mg total) by mouth 2 (two) times daily. 1 qd 07/28/13  Yes Hendricks Limes, MD   Cyanocobalamin (VITAMIN B-12) 1000 MCG SUBL Place 1,000 mcg under the tongue every morning.   Yes Historical Provider, MD  Dextromethorphan-Guaifenesin (ROBITUSSIN DM PO) Take 15 mLs by mouth daily as needed (cough).   Yes Historical Provider, MD  docusate sodium 100 MG CAPS Take 100 mg by mouth 2 (two) times daily. 07/25/13  Yes Kelvin Cellar, MD  finasteride (PROSCAR) 5 MG tablet Take 5 mg by mouth daily.    Yes Historical Provider, MD  furosemide (LASIX) 20 MG tablet Take 1 tablet (20 mg total) by mouth daily. 07/25/13  Yes Kelvin Cellar, MD  gabapentin (NEURONTIN) 100 MG capsule Take 1 capsule (100 mg total) by mouth 2 (two) times daily. 05/06/13  Yes Hendricks Limes, MD  LORazepam (ATIVAN) 0.5 MG tablet Take 0.5-1 mg by mouth 2 (two) times daily. 0.5 mg in the morning and 1 mg at bedtime   Yes Historical Provider, MD  metoprolol tartrate (LOPRESSOR) 25 MG tablet 1/2 bid due to low BP 07/28/13  Yes Hendricks Limes, MD  Multiple Vitamin (MULTIVITAMIN) tablet Take 1 tablet by mouth daily. Chewable tablet with iron   Yes Historical Provider, MD  pantoprazole (PROTONIX) 40 MG tablet Take 1 tablet (40 mg total) by mouth 2 (two) times daily. 07/25/13  Yes Kelvin Cellar, MD  polyethylene glycol (MIRALAX / GLYCOLAX) packet Take 17 g by mouth daily. 07/25/13  Yes Kelvin Cellar, MD  risperiDONE (RISPERDAL) 0.5 MG tablet Take 1 tablet (0.5 mg total) by mouth at bedtime. 06/03/13  Yes Kathlee Nations, MD  rivaroxaban (XARELTO) 20 MG TABS tablet Take 1 tablet (20 mg total) by mouth daily with supper. 07/25/13  Yes Kelvin Cellar, MD  sertraline (ZOLOFT) 100 MG tablet Take 1.5 tablets (150 mg total) by mouth daily with breakfast. 06/03/13  Yes Kathlee Nations, MD  Tetrahydrozoline HCl (VISINE OP) Place 2 drops into both eyes daily as needed (dry eyes).   Yes Historical Provider, MD   Physical Exam: Filed Vitals:   07/29/13 0030  BP: 109/46  Pulse: 117  Temp:   Resp: 18    BP 109/46  Pulse 117   Temp(Src) 98.9 F (37.2 C) (Oral)  Resp 18  SpO2 96%  General Appearance:    Alert, oriented, no distress, appears stated age  Head:    Normocephalic, atraumatic  Eyes:    PERRL, EOMI, sclera non-icteric        Nose:   Nares without drainage or epistaxis. Mucosa, turbinates normal  Throat:   Moist mucous membranes. Oropharynx without erythema or exudate.  Neck:   Supple. No carotid bruits.  No  thyromegaly.  No lymphadenopathy.   Back:     No CVA tenderness, no spinal tenderness  Lungs:     Clear to auscultation bilaterally, without wheezes, rhonchi or rales  Chest wall:    No tenderness to palpitation  Heart:    Irregularly irregular, distant heart sounds  Abdomen:     Soft, non-tender, nondistended, normal bowel sounds, no organomegaly  Genitalia:    deferred  Rectal:    deferred  Extremities:   No clubbing, cyanosis or edema.  Pulses:   2+ and symmetric all extremities  Skin:   Skin color, texture, turgor normal, no rashes or lesions  Lymph nodes:   Cervical, supraclavicular, and axillary nodes normal  Neurologic:   CNII-XII intact. Normal strength, sensation and reflexes      throughout    Labs on Admission:  Basic Metabolic Panel:  Recent Labs Lab 07/22/13 0531 07/24/13 0255 07/25/13 0553 07/28/13 1130  NA 145 143 144 135  K 3.7 4.2 3.5* 3.9  CL 108 108 105 100  CO2 24 24 27 22   GLUCOSE 134* 94 105* 138*  BUN 59* 30* 20 28*  CREATININE 1.01 1.16 1.11 1.4  CALCIUM 8.1* 7.8* 8.3* 8.0*   Liver Function Tests:  Recent Labs Lab 07/22/13 0531 07/28/13 1130  AST 41* 359*  ALT 70* 224*  ALKPHOS 221* 382*  BILITOT 0.4 1.3*  PROT 5.4* 6.3  ALBUMIN 2.3* 2.7*   No results found for this basename: LIPASE, AMYLASE,  in the last 168 hours No results found for this basename: AMMONIA,  in the last 168 hours CBC:  Recent Labs Lab 07/22/13 0531  07/23/13 0636 07/23/13 2030 07/24/13 0255 07/25/13 0553 07/28/13 1130  WBC 21.5*  --   --   --   --  13.0* 21.8  Repeated and verified X2.*  NEUTROABS  --   --   --   --   --   --  18.9*  HGB 7.8*  < > 8.0* 7.8* 7.7* 8.6* 9.3*  HCT 23.5*  < > 24.5* 23.6* 23.5* 26.5* 28.2*  MCV 90.4  --   --   --   --  90.8 89.6  PLT 437*  --   --   --   --  405* 478.0*  < > = values in this interval not displayed. Cardiac Enzymes: No results found for this basename: CKTOTAL, CKMB, CKMBINDEX, TROPONINI,  in the last 168 hours  BNP (last 3 results)  Recent Labs  07/13/13 1835  PROBNP 519.5*   CBG: No results found for this basename: GLUCAP,  in the last 168 hours  Radiological Exams on Admission: Dg Chest 2 View  07/28/2013   CLINICAL DATA:  Chest congestion.  Short of breath.  EXAM: CHEST  2 VIEW  COMPARISON:  07/22/2013  FINDINGS: Cardiac silhouette is normal in size and configuration. Normal mediastinal and hilar contours.  Clear lungs.  No pleural effusion.  No pneumothorax.  Bony thorax is intact.  IMPRESSION: No acute cardiopulmonary disease.   Electronically Signed   By: Lajean Manes M.D.   On: 07/28/2013 13:21    EKG: Independently reviewed.  Assessment/Plan Principal Problem:   Hypotension Active Problems:   Pulmonary thromboembolism   Pericarditis   Hepatitis   Atrial fibrillation   Leukocytosis   1. Hypotension - patient continues to be hypotensive since discharge, hypotension doesn't appear to be worsening in that time frame however.  Will continue the 12.5mg  BID metoprolol for A.Fib rate control, if another  cause of his hypotension cannot be found during this hospital stay then may need to switch to digoxin.  Will hold off on switching acutely as he is asymptomatic while lying down. 2. PE - continue xarelto, evaluate 2d echo for heart strain 3. Pericarditis - will go ahead and get a 2d echo to see if there is any evidence of recurrence of pericardial effusion; however, while it is possible, this is unlikely after a pericardial window. 4. Elevated LFTs - Hepatitis due either to shock liver or  viral syndrome. 5. Leukocytosis - no other SIRS criteria at this point, patient not febrile.  Blood cultures obtained, but patient has recently had infectious work up of pericardial fluid last admit and nothing has grown out at this point.  UA negative, CXR negative, does have cough but this is unchanged, ABD negative, checking [place] of RUQ to see if there are any findings there.  Given the lack of SIRS and the fact that throughout his entire admit his WBC remained persistently elevated except on the 1 reading on the day of discharge, will go ahead and hold off on ABX for now. 6. A.Fib - Xarelto and metoprolol, if AVR worsens then may need to switch to digoxin.    Code Status: Full  Family Communication: Family at bedside Disposition Plan: Admit to inpatient   Time spent: 70 min  [facility] Pager [phone]  If 7AM-7PM, please contact the day team taking care of the patient [email or web address] Password [email or web address] 07/29/2013, 1:27 AM

## 2013-07-30 DIAGNOSIS — R053 Chronic cough: Secondary | ICD-10-CM | POA: Insufficient documentation

## 2013-07-30 DIAGNOSIS — K759 Inflammatory liver disease, unspecified: Secondary | ICD-10-CM

## 2013-07-30 DIAGNOSIS — R059 Cough, unspecified: Secondary | ICD-10-CM

## 2013-07-30 DIAGNOSIS — R05 Cough: Secondary | ICD-10-CM

## 2013-07-30 DIAGNOSIS — D649 Anemia, unspecified: Secondary | ICD-10-CM

## 2013-07-30 DIAGNOSIS — K254 Chronic or unspecified gastric ulcer with hemorrhage: Secondary | ICD-10-CM

## 2013-07-30 DIAGNOSIS — K921 Melena: Secondary | ICD-10-CM

## 2013-07-30 DIAGNOSIS — I2699 Other pulmonary embolism without acute cor pulmonale: Secondary | ICD-10-CM

## 2013-07-30 LAB — COMPREHENSIVE METABOLIC PANEL
ALK PHOS: 403 U/L — AB (ref 39–117)
ALT: 223 U/L — ABNORMAL HIGH (ref 0–53)
AST: 221 U/L — AB (ref 0–37)
Albumin: 2.1 g/dL — ABNORMAL LOW (ref 3.5–5.2)
BILIRUBIN TOTAL: 1 mg/dL (ref 0.3–1.2)
BUN: 21 mg/dL (ref 6–23)
CHLORIDE: 104 meq/L (ref 96–112)
CO2: 25 mEq/L (ref 19–32)
Calcium: 8 mg/dL — ABNORMAL LOW (ref 8.4–10.5)
Creatinine, Ser: 1.09 mg/dL (ref 0.50–1.35)
GFR, EST AFRICAN AMERICAN: 79 mL/min — AB (ref >90)
GFR, EST NON AFRICAN AMERICAN: 68 mL/min — AB (ref >90)
GLUCOSE: 116 mg/dL — AB (ref 70–99)
POTASSIUM: 4.2 meq/L (ref 3.7–5.3)
SODIUM: 140 meq/L (ref 137–147)
Total Protein: 5.6 g/dL — ABNORMAL LOW (ref 6.0–8.3)

## 2013-07-30 LAB — CK: Total CK: 21 U/L (ref 7–232)

## 2013-07-30 LAB — URINE CULTURE
Colony Count: NO GROWTH
Culture: NO GROWTH

## 2013-07-30 LAB — CBC
HCT: 24.4 % — ABNORMAL LOW (ref 39.0–52.0)
HEMOGLOBIN: 7.7 g/dL — AB (ref 13.0–17.0)
MCH: 29.1 pg (ref 26.0–34.0)
MCHC: 31.6 g/dL (ref 30.0–36.0)
MCV: 92.1 fL (ref 78.0–100.0)
PLATELETS: 429 10*3/uL — AB (ref 150–400)
RBC: 2.65 MIL/uL — AB (ref 4.22–5.81)
RDW: 16.6 % — ABNORMAL HIGH (ref 11.5–15.5)
WBC: 11.7 10*3/uL — AB (ref 4.0–10.5)

## 2013-07-30 LAB — HEMOGLOBIN AND HEMATOCRIT, BLOOD
HCT: 25.5 % — ABNORMAL LOW (ref 39.0–52.0)
Hemoglobin: 8.1 g/dL — ABNORMAL LOW (ref 13.0–17.0)

## 2013-07-30 MED ORDER — METOCLOPRAMIDE HCL 5 MG/ML IJ SOLN
10.0000 mg | Freq: Once | INTRAMUSCULAR | Status: AC
  Administered 2013-07-31: 10 mg via INTRAVENOUS
  Filled 2013-07-30: qty 2

## 2013-07-30 MED ORDER — PEG-KCL-NACL-NASULF-NA ASC-C 100 G PO SOLR
0.5000 | Freq: Once | ORAL | Status: AC
  Administered 2013-07-30: 100 g via ORAL
  Filled 2013-07-30: qty 1

## 2013-07-30 MED ORDER — METOCLOPRAMIDE HCL 5 MG/ML IJ SOLN
10.0000 mg | Freq: Once | INTRAMUSCULAR | Status: AC
  Administered 2013-07-30: 10 mg via INTRAVENOUS
  Filled 2013-07-30 (×2): qty 2

## 2013-07-30 MED ORDER — PANTOPRAZOLE SODIUM 40 MG PO TBEC
40.0000 mg | DELAYED_RELEASE_TABLET | Freq: Every day | ORAL | Status: DC
  Administered 2013-07-30 – 2013-08-01 (×3): 40 mg via ORAL
  Filled 2013-07-30 (×3): qty 1

## 2013-07-30 MED ORDER — PEG-KCL-NACL-NASULF-NA ASC-C 100 G PO SOLR: 1.0000 | Freq: Once | ORAL | Status: DC

## 2013-07-30 MED ORDER — FLUTICASONE PROPIONATE 50 MCG/ACT NA SUSP
2.0000 | Freq: Every day | NASAL | Status: DC
  Administered 2013-07-30 – 2013-08-04 (×5): 2 via NASAL
  Filled 2013-07-30 (×2): qty 16

## 2013-07-30 MED ORDER — ADULT MULTIVITAMIN LIQUID CH
5.0000 mL | Freq: Every day | ORAL | Status: DC
  Administered 2013-07-31 – 2013-08-04 (×5): 5 mL via ORAL
  Filled 2013-07-30 (×6): qty 5

## 2013-07-30 MED ORDER — PEG-KCL-NACL-NASULF-NA ASC-C 100 G PO SOLR
0.5000 | Freq: Once | ORAL | Status: AC
  Administered 2013-07-31: 100 g via ORAL

## 2013-07-30 MED ORDER — SODIUM CHLORIDE 0.9 % IV SOLN
INTRAVENOUS | Status: DC
  Administered 2013-07-30 – 2013-07-31 (×2): via INTRAVENOUS

## 2013-07-30 MED ORDER — LORATADINE 10 MG PO TABS
10.0000 mg | ORAL_TABLET | Freq: Every day | ORAL | Status: DC
  Administered 2013-07-30 – 2013-08-04 (×6): 10 mg via ORAL
  Filled 2013-07-30 (×6): qty 1

## 2013-07-30 NOTE — Progress Notes (Signed)
Pt ambulated 500 feet on RA, sats remaining 92-96%, pt tolerated well Rickard Rhymes, RN

## 2013-07-30 NOTE — Progress Notes (Signed)
Daily Rounding Note  07/30/2013, 8:19 AM  LOS: 2 days   SUBJECTIVE:       Feeling better, cough improved with Tussionex and was able to get a good night's sleep.   BM less dark.  No GI sxs, tolerating solids.   OBJECTIVE:         Vital signs in last 24 hours:    Temp:  [98.1 F (36.7 C)-99.6 F (37.6 C)] 98.1 F (36.7 C) (04/29 0452) Pulse Rate:  [67-88] 67 (04/29 0452) Resp:  [18-20] 19 (04/29 0452) BP: (100-126)/(53-63) 108/59 mmHg (04/29 0452) SpO2:  [93 %-98 %] 93 % (04/29 0452) Last BM Date: 07/29/13 General: looks better, comfortable and relaxed   Heart: RRR Chest: clear, some occasional dry cough Abdomen: soft, active BS, NT.  No mass, no HSM  Extremities: no CCE Neuro/Psych:  Pleasant, oriented x 3.  Excellent historian.  No tremors or gross weakness.   Lab Results:  Recent Labs  07/28/13 1130 07/29/13 0510 07/29/13 1610 07/30/13 0500  WBC 21.8 Repeated and verified X2.* 15.7*  --  11.7*  HGB 9.3* 8.7* 8.1* 7.7*  HCT 28.2* 26.7* 25.0* 24.4*  PLT 478.0* 402*  --  429*   BMET  Recent Labs  07/28/13 1130 07/29/13 0510 07/30/13 0500  NA 135 139 140  K 3.9 3.7 4.2  CL 100 102 104  CO2 22 21 25   GLUCOSE 138* 120* 116*  BUN 28* 27* 21  CREATININE 1.4 1.17 1.09  CALCIUM 8.0* 8.1* 8.0*   LFT  Recent Labs  07/28/13 1130 07/30/13 0500  PROT 6.3 5.6*  ALBUMIN 2.7* 2.1*  AST 359* 221*  ALT 224* 223*  ALKPHOS 382* 403*  BILITOT 1.3* 1.0  BILIDIR 0.6*  --     Hepatitis Panel  Recent Labs  07/29/13 0126  HEPBSAG NEGATIVE  HCVAB NEGATIVE  HEPAIGM NON REACTIVE  HEPBIGM NON REACTIVE    Studies/Results: US Abdomen Limited Ruq 07/29/2013  FINDINGS: Gallbladder:  The gallbladder is surgically absent.  Common bile duct:  Diameter: 5.5 mm.  Liver:  The hepatic echotexture is heterogeneous. No discrete mass is demonstrated. There is no intrahepatic ductal dilation.  IMPRESSION:  Heterogeneous echotexture of the liver may reflect fatty infiltration. Given the elevated hepatic function studies, abdominal CT scanning or MRI may be useful next steps.   Electronically Signed   By: David  Martinique   On: 07/29/2013 01:55   Scheduled Meds: . allopurinol  300 mg Oral Q breakfast  . colchicine  0.6 mg Oral BID  . docusate sodium  100 mg Oral BID  . finasteride  5 mg Oral QHS  . gabapentin  100 mg Oral BID  . LORazepam  0.5-1 mg Oral BID  . metoprolol tartrate  12.5 mg Oral BID  . multivitamin with minerals  1 tablet Oral Daily  . pantoprazole (PROTONIX) IV  40 mg Intravenous Q12H  . polyethylene glycol  17 g Oral Daily  . risperiDONE  0.5 mg Oral QHS  . sertraline  150 mg Oral Q breakfast  . sodium chloride  3 mL Intravenous Q12H   Continuous Infusions:  PRN Meds:.chlorpheniramine-HYDROcodone, ondansetron  ASSESMENT:   * Elevated LFTs without jaundice.  Acute hepatitis panel last week negative. GB is out. No abdominal pain. Fatty liver on ultrasound. Wonder if it is Colchicine (added last admission) or some other medication? Sequelae of pericardial window? * FOBT +. Recent GIB in setting of Xarelto therapy. Stools chronically  dark while takeing MVI with Iron.  EGD 4/21 with superficial gastric ulcer. On PPI at home, now BID IV Protonix.  H Pylori +, has yet to be treated. * Normocytic anemia. Hgb down 1.5 grams in last 3 days.  Received 2 units PRBCs last week. Hgb relatively stable.  * Worsening cough and dyspnea. ? Weakness s/e of BB: on Metoprolol now,  * Leukocytosis. No fever. No ABX currently. Declining spontaneously  * Hx PE.  Atrial fibrillation, remains in NSR. Xarelto held briefly last week but restarted at discharge. Is again on hold  * 4/17 pericardial window, drainage of pericardial effusion felt to be viral in origin. On Colchicine.  * 04/08/13 lap band and small HH repair (NOT A WRAP), steady weight loss since. Has not had fluid infused into the lap band  since initial placement.  * Chronic constipation, better with daily colace and Miralax.    PLAN   *  Per Dr Carlean Purl. ? Hold colchicine? (ask cardiology about holding?) *  Switch to po Protonix. *  Check AMA, SMA, alpha 1 AT, ceruloplasmin.     Vena Rua  07/30/2013, 8:19 AM Pager: [phone]  [facility] Attending  I have also seen and assessed the patient and agree with the above note. Still feels bad, Hgb drifting. Though I still do not get the sense that he has had GI hemorrhage again - cause of his problems not clear and restarting anticoag Tx vs IVC filter ? Remains. Colonoscopy/EGD will help sort out what to do here so will proceed tomorrow. I think LFT changes most likely another ischemic hit to liver since he had hypotension again. Colchicine could be a culprit, though.Now off that.  The risks and benefits as well as alternatives of endoscopic procedure(s) have been discussed and reviewed. All questions answered. The patient agrees to proceed.  Gatha Mayer, MD, [facility] [phone] (pager) 07/30/2013 4:53 PM

## 2013-07-30 NOTE — Progress Notes (Addendum)
SUBJECTIVE:  Feels better  OBJECTIVE:   Vitals:   Filed Vitals:   07/29/13 1443 07/29/13 1446 07/29/13 2107 07/30/13 0452  BP: 126/61 100/53 119/63 108/59  Pulse: 78 88 77 67  Temp:   99.6 F (37.6 C) 98.1 F (36.7 C)  TempSrc:   Oral Oral  Resp:   20 19  Height:      Weight:      SpO2:   98% 93%   I&O's:   Intake/Output Summary (Last 24 hours) at 07/30/13 0741 Last data filed at 07/29/13 1800  Gross per 24 hour  Intake    480 ml  Output    350 ml  Net    130 ml   TELEMETRY: Reviewed telemetry pt in NSR:     PHYSICAL EXAM General: Well developed, well nourished, in no acute distress Head: Eyes PERRLA, No xanthomas.   Normal cephalic and atramatic  Lungs:   Clear bilaterally to auscultation and percussion. Heart:   HRRR S1 S2 Pulses are 2+ & equal. Abdomen: Bowel sounds are positive, abdomen soft and non-tender without masses Extremities:   No clubbing, cyanosis or edema.  DP +1 Neuro: Alert and oriented X 3. Psych:  Good affect, responds appropriately   LABS: Basic Metabolic Panel:  Recent Labs  07/29/13 0510 07/30/13 0500  NA 139 140  K 3.7 4.2  CL 102 104  CO2 21 25  GLUCOSE 120* 116*  BUN 27* 21  CREATININE 1.17 1.09  CALCIUM 8.1* 8.0*   Liver Function Tests:  Recent Labs  07/28/13 1130 07/30/13 0500  AST 359* 221*  ALT 224* 223*  ALKPHOS 382* 403*  BILITOT 1.3* 1.0  PROT 6.3 5.6*  ALBUMIN 2.7* 2.1*   No results found for this basename: LIPASE, AMYLASE,  in the last 72 hours CBC:  Recent Labs  07/28/13 1130 07/29/13 0510 07/29/13 1610 07/30/13 0500  WBC 21.8 Repeated and verified X2.* 15.7*  --  11.7*  NEUTROABS 18.9*  --   --   --   HGB 9.3* 8.7* 8.1* 7.7*  HCT 28.2* 26.7* 25.0* 24.4*  MCV 89.6 89.6  --  92.1  PLT 478.0* 402*  --  429*   Cardiac Enzymes:  Recent Labs  07/30/13 0500  CKTOTAL 21   BNP: No components found with this basename: POCBNP,  D-Dimer: No results found for this basename: DDIMER,  in the last 72  hours Hemoglobin A1C: No results found for this basename: HGBA1C,  in the last 72 hours Fasting Lipid Panel: No results found for this basename: CHOL, HDL, LDLCALC, TRIG, CHOLHDL, LDLDIRECT,  in the last 72 hours Thyroid Function Tests: No results found for this basename: TSH, T4TOTAL, FREET3, T3FREE, THYROIDAB,  in the last 72 hours Anemia Panel: No results found for this basename: VITAMINB12, FOLATE, FERRITIN, TIBC, IRON, RETICCTPCT,  in the last 72 hours Coag Panel:   No results found for this basename: INR, PROTIME    RADIOLOGY: Dg Chest 2 View  07/28/2013   CLINICAL DATA:  Chest congestion.  Short of breath.  EXAM: CHEST  2 VIEW  COMPARISON:  07/22/2013  FINDINGS: Cardiac silhouette is normal in size and configuration. Normal mediastinal and hilar contours.  Clear lungs.  No pleural effusion.  No pneumothorax.  Bony thorax is intact.  IMPRESSION: No acute cardiopulmonary disease.   Electronically Signed   By: Lajean Manes M.D.   On: 07/28/2013 13:21   Dg Chest 2 View  07/15/2013   CLINICAL DATA:  Dyspnea and  chest pain.  EXAM: CHEST  2 VIEW  COMPARISON:  07/13/2013 and 09/26/2012  FINDINGS: Lungs are adequately inflated with continued evidence of a small left pleural effusion with possible slight interval improvement. There is mild stable cardiomegaly. Remainder the exam is unchanged.  IMPRESSION: Continued small left pleural effusion with possible slight interval improvement.   Electronically Signed   By: Marin Olp M.D.   On: 07/15/2013 11:32   Dg Chest 2 View  07/13/2013   CLINICAL DATA:  Weakness and chest pain.  EXAM: CHEST - 2 VIEW  COMPARISON:  DG CHEST 2 VIEW dated 09/26/2012; DG THORACIC SPINE dated 01/19/2011  FINDINGS: The heart is mildly enlarged. Mild interstitial and pulmonary vascular prominence identified without evidence of overt edema. There is a small left pleural effusion. Bony structures show stable mild degenerative disease of the thoracic spine.  IMPRESSION:  Cardiomegaly and pulmonary interstitial prominence with small left pleural effusion. No overt edema is identified   Electronically Signed   By: Aletta Edouard M.D.   On: 07/13/2013 18:16   Ct Angio Chest Pe W/cm &/or Wo Cm  07/16/2013   CLINICAL DATA:  Shortness of breath, neck/back pain, elevated D-dimer, evaluate for PE. Lap band surgery in January.  EXAM: CT ANGIOGRAPHY CHEST WITH CONTRAST  TECHNIQUE: Multidetector CT imaging of the chest was performed using the standard protocol during bolus administration of intravenous contrast. Multiplanar CT image reconstructions and MIPs were obtained to evaluate the vascular anatomy.  CONTRAST:  32mL OMNIPAQUE IOHEXOL 350 MG/ML SOLN  COMPARISON:  None.  FINDINGS: Tiny subsegmental filling defects within branches of the right lower lobe pulmonary artery (series 5/ images 169, 170, and 189), suspicious for pulmonary emboli. Overall clot burden is small. No findings to suggest right heart strain (RV-to-LV ratio 0.87).  Small to moderate left and small right pleural effusions. No frank interstitial edema. No pneumothorax.  Visualized right thyroid is notable for a suspected 2.2 cm thyroid nodule (series 4/ image 1), incompletely visualized.  The heart is top-normal in size. Small to moderate pericardial effusion with a mildly irregular appearance. Mild atherosclerotic calcifications of the aortic arch.  No suspicious mediastinal, hilar, or axillary lymphadenopathy.  Visualized upper abdomen is notable for a laparoscopic band in satisfactory position.  Degenerative changes of the visualized thoracolumbar spine.  Review of the MIP images confirms the above findings.  IMPRESSION: Suspected tiny subsegmental pulmonary emboli within branches of the right lower lobe pulmonary artery. Overall clot burden is small.  Small to moderate left and small right pleural effusions. No frank interstitial edema.  Small to moderate pericardial effusion. Given the irregular appearance,  correlate for pericarditis.  Critical Value/emergent results were called by telephone at the time of interpretation on 07/16/2013 at 5:28 PM to Dr. Gwyndolyn Saxon HOPPER, who verbally acknowledged these results.   Electronically Signed   By: Julian Hy M.D.   On: 07/16/2013 17:29   US Abdomen Limited  07/13/2013   CLINICAL DATA:  Increased liver function tests, right upper quadrant pain  EXAM: US ABDOMEN LIMITED - RIGHT UPPER QUADRANT  COMPARISON:  None.  FINDINGS: Gallbladder:  Surgically at  Common bile duct:  Diameter: 7 mm  Liver:  No focal abnormalities.  IMPRESSION: No acute abnormalities   Electronically Signed   By: Skipper Cliche M.D.   On: 07/13/2013 20:37   Dg Chest Port 1 View  07/22/2013   CLINICAL DATA:  Pericardial effusion.  Pericardial window.  EXAM: PORTABLE CHEST - 1 VIEW  COMPARISON:  DG  CHEST 1V PORT dated 07/21/2013  FINDINGS: Right IJ line in stable position. Stable cardiomegaly. No pulmonary venous distention. Persistent mild left base subsegmental atelectasis remains. Mild elevation left hemidiaphragm. No prominent pleural effusion or pneumothorax noted. No acute osseous abnormality.  IMPRESSION: 1. Right IJ line in stable position. 2. Stable cardiomegaly. 3. Persistent left base subsegmental atelectasis.   Electronically Signed   By: Marcello Moores  Register   On: 07/22/2013 07:22   Dg Chest Port 1 View  07/21/2013   CLINICAL DATA:  Pericardial window.  EXAM: PORTABLE CHEST - 1 VIEW  COMPARISON:  DG CHEST PORT 1VSAME DAY dated 07/20/2013; CT ANGIO CHEST W/CM &/OR WO/CM dated 07/16/2013  FINDINGS: Mediastinum hilar structures are normal. Right IJ line in stable position. Stable cardiomegaly and pulmonary vascularity. Mild left base subsegmental atelectasis. No pneumothorax.  IMPRESSION: 1. Right IJ line in stable position. 2. Stable cardiomegaly, no CHF. 3. Mild left lung base atelectasis   Electronically Signed   By: Fernley   On: 07/21/2013 10:30   Dg Chest Port 1  View  07/18/2013   CLINICAL DATA:  Cough.  Status post pericardial window procedure.  EXAM: PORTABLE CHEST - 1 VIEW  COMPARISON:  CT ANGIO CHEST W/CM &/OR WO/CM dated 07/16/2013; DG CHEST 2 VIEW dated 07/15/2013  FINDINGS: Right IJ line tip: Upper SVC. Moderately enlarged cardiopericardial silhouette. Indistinct pulmonary vasculature compatible with pulmonary venous hypertension.  Right costophrenic angle excluded. No pneumothorax or significant pneumomediastinum observed. Retrocardiac density suggests mild atelectasis in the left lower lobe.  IMPRESSION: 1. Continued enlargement of the cardiopericardial silhouette, with underlying pulmonary venous hypertension. 2. Mild atelectasis in the left lower lobe. 3. Right IJ line tip:  Upper SVC.   Electronically Signed   By: Sherryl Barters M.D.   On: 07/18/2013 15:01   Dg Chest Port 1v Same Day  07/20/2013   CLINICAL DATA:  68 year old male status post pericardial window for effusion. Initial encounter.  EXAM: PORTABLE CHEST - 1 VIEW SAME DAY  COMPARISON:  07/18/2013 and earlier.  FINDINGS: Portable AP upright view at 0722 hr. Stable right IJ central line. Stable cardiac size and mediastinal contours. No pneumothorax or edema. Continued retrocardiac opacity but low the less veiling opacity at the lung bases.  IMPRESSION: 1. Stable cardiac silhouette since 07/18/2013. 2. Suspect regressed small pleural effusions but continued lower lobe collapse or consolidation.   Electronically Signed   By: Lars Pinks M.D.   On: 07/20/2013 13:28   US Abdomen Limited Ruq  07/29/2013   CLINICAL DATA:  Elevated hepatic function studies  EXAM: US ABDOMEN LIMITED - RIGHT UPPER QUADRANT  COMPARISON:  US ABDOMEN LIMITED dated 07/13/2013; CT ANGIO CHEST W/CM &/OR WO/CM dated 07/16/2013; CT UROGRAM dated 11/28/2012  FINDINGS: Gallbladder:  The gallbladder is surgically absent.  Common bile duct:  Diameter: 5.5 mm.  Liver:  The hepatic echotexture is heterogeneous. No discrete mass is  demonstrated. There is no intrahepatic ductal dilation.  IMPRESSION: Heterogeneous echotexture of the liver may reflect fatty infiltration. Given the elevated hepatic function studies, abdominal CT scanning or MRI may be useful next steps.   Electronically Signed   By: David  Martinique   On: 07/29/2013 01:55   Assessment/Plan  Principal Problem:  Hypotension  Active Problems:  PAF (paroxysmal atrial fibrillation) with RVR- episode last pm, not receiving lopressor due to hypotension  Pulmonary thromboembolism--on Xarelto  Pericardial effusion s/p pericardial window 07/18/13 400cc- minimal residual effusion on echo 07/29/13  SLEEP APNEA with c pap  H/O  laparoscopic adjustable gastric banding & hiatal hernia repair 04/08/13  Hepatitis  Chronic anticoagulation-Xarelto- would hold if actively bleeding  Gastric ulcer with hemorrhage 07/20/13--PPI BID- now with dark stools, check for hemoccult - GI eval  Leukocytosis  Anemia-some improvement was on Iron at home but H/H still low.  DOE with any exertion.   There is minimal residual effusion with normal RV and LV function. (No RV strain). SBP much improved.I agree with Dr. Haroldine Laws that he may have recurrent GIB. Doubt his symptoms are cardiac in nature. Would continue to hydrate and w/u for GIB. Can hold Xarelto for now. (if has recurrent GIB may need IVC filter). Back in NSR. Holding Xarelto for GIB. Etiology of effusion felt to be related to viral pericarditis. Will check ESR.   Sueanne Margarita, MD  07/30/2013  7:41 AM

## 2013-07-30 NOTE — Progress Notes (Addendum)
Chart reviewed.  [facility] PROGRESS NOTE  [name] ZSW:[id number] DOB: January 07, 1946 DOA: 07/28/2013 PCP: [name], MD  Assessment/Plan: 1. Recent UGI bleed with continued heme positive stool and continued drop in hgb -xarelto held -Had upper GI bleed on recent hospitalization, undergoing EGD, procedure performed by Dr. Henrene Pastor which showed superficial gastric ulcers. Patient was discharged on Protonix 40 mg by mouth twice a day -Patient presenting with hypotension, orthostasis, having an episode of black tarry stools If hgb drops further, will need transfusion.  GI may scope.  2. Hypotension Resolved. Recheck orthostatics. Metoprolol decreased  3. History of pulmonary embolism. xarelto held.  Would not place IVC filter unless definate evidence of recurrent GIB  4. History of large pericardial effusion in setting of pericarditis -Repeat transthoracic echocardiogram today showed small circumferential pericardial effusion. Patient is refusing colchicine, concerned it may be causing increased LFTs. Have paged Dr. Radford Pax to discuss  5. Generalized weakness/fatigue Multifactorial: anemia, borderline blood pressures  6. Elevated transaminases US shows only fatty liver.  Pt has refused neurontin, colchicine, allopurinol due to concerns may be etiology.  Will d/c all  7. Leukocytosis Resolving without abx  Chronic cough for months: robitussin hasn't helped. dayquil does sometimes. Has chronic postnasal drip and seasonal allergies. May be sinus related. Add flonase and claritin. Add prn tessalon. CXR negative  Acute on chronic anemia: see above  Code Status: Full Code Family Communication: multiple at bedside Disposition Plan: home when stable   Consultants:  Cardiology  GI   HPI/Subjective: Cough for months. No overt bleeding  Objective: Filed Vitals:   07/30/13 1203  BP:   Pulse: 84  Temp:   Resp:     Intake/Output Summary (Last 24 hours) at  07/30/13 1616 Last data filed at 07/30/13 0815  Gross per 24 hour  Intake    480 ml  Output    650 ml  Net   -170 ml   Filed Weights   07/29/13 0229  Weight: 134.1 kg (295 lb 10.2 oz)    Exam:   General:  In chair, dry, hacking cough. Pale  HEENT: pale conjunctiva. No sinus tenderness  Cardiovascular: Regular rate and rhythm, normal S1S2  Respiratory: Bibasilar crackles, normal inspiratory effort  Abdomen: Soft, nontender nondistended  Ext: 2+ edema  Data Reviewed: Basic Metabolic Panel:  Recent Labs Lab 07/24/13 0255 07/25/13 0553 07/28/13 1130 07/29/13 0510 07/30/13 0500  NA 143 144 135 139 140  K 4.2 3.5* 3.9 3.7 4.2  CL 108 105 100 102 104  CO2 24 27 22 21 25   GLUCOSE 94 105* 138* 120* 116*  BUN 30* 20 28* 27* 21  CREATININE 1.16 1.11 1.4 1.17 1.09  CALCIUM 7.8* 8.3* 8.0* 8.1* 8.0*   Liver Function Tests:  Recent Labs Lab 07/28/13 1130 07/30/13 0500  AST 359* 221*  ALT 224* 223*  ALKPHOS 382* 403*  BILITOT 1.3* 1.0  PROT 6.3 5.6*  ALBUMIN 2.7* 2.1*   No results found for this basename: LIPASE, AMYLASE,  in the last 168 hours No results found for this basename: AMMONIA,  in the last 168 hours CBC:  Recent Labs Lab 07/25/13 0553 07/28/13 1130 07/29/13 0510 07/29/13 1610 07/30/13 0500  WBC 13.0* 21.8 Repeated and verified X2.* 15.7*  --  11.7*  NEUTROABS  --  18.9*  --   --   --   HGB 8.6* 9.3* 8.7* 8.1* 7.7*  HCT 26.5* 28.2* 26.7* 25.0* 24.4*  MCV 90.8 89.6 89.6  --  92.1  PLT 405* 478.0* 402*  --  429*   Cardiac Enzymes:  Recent Labs Lab 07/30/13 0500  CKTOTAL 21   BNP (last 3 results)  Recent Labs  07/13/13 1835  PROBNP 519.5*   CBG: No results found for this basename: GLUCAP,  in the last 168 hours  Recent Results (from the past 240 hour(s))  CULTURE, BLOOD (ROUTINE X 2)     Status: None   Collection Time    07/28/13 10:31 PM      Result Value Ref Range Status   Specimen Description BLOOD RIGHT ANTECUBITAL   Final    Special Requests     Final   Value: BOTTLES DRAWN AEROBIC AND ANAEROBIC 8CC BLUE 4CC RED   Culture  Setup Time     Final   Value: 08/01/13 04:07     Performed at [facility]   Culture     Final   Value:        BLOOD CULTURE RECEIVED NO GROWTH TO DATE CULTURE WILL BE HELD FOR 5 DAYS BEFORE ISSUING A FINAL NEGATIVE REPORT     Performed at [facility]   Report Status PENDING   Incomplete  CULTURE, BLOOD (ROUTINE X 2)     Status: None   Collection Time    07/28/13 10:40 PM      Result Value Ref Range Status   Specimen Description BLOOD RIGHT FOREARM   Final   Special Requests BOTTLES DRAWN AEROBIC ONLY 4CC   Final   Culture  Setup Time     Final   Value: 2013/08/01 04:06     Performed at [facility]   Culture     Final   Value:        BLOOD CULTURE RECEIVED NO GROWTH TO DATE CULTURE WILL BE HELD FOR 5 DAYS BEFORE ISSUING A FINAL NEGATIVE REPORT     Performed at [facility]   Report Status PENDING   Incomplete  URINE CULTURE     Status: None   Collection Time    07/28/13 11:04 PM      Result Value Ref Range Status   Specimen Description URINE, CATHETERIZED   Final   Special Requests NONE   Final   Culture  Setup Time     Final   Value: 01-Aug-2013 00:07     Performed at [facility]     Final   Value: NO GROWTH     Performed at [facility]   Culture     Final   Value: NO GROWTH     Performed at [facility]   Report Status 07/30/2013 FINAL   Final     Studies: US Abdomen Limited Ruq  01-Aug-2013   CLINICAL DATA:  Elevated hepatic function studies  EXAM: US ABDOMEN LIMITED - RIGHT UPPER QUADRANT  COMPARISON:  US ABDOMEN LIMITED dated 07/13/2013; CT ANGIO CHEST W/CM &/OR WO/CM dated 07/16/2013; CT UROGRAM dated 11/28/2012  FINDINGS: Gallbladder:  The gallbladder is surgically absent.  Common bile duct:  Diameter: 5.5 mm.  Liver:  The hepatic echotexture is heterogeneous. No discrete mass is demonstrated.  There is no intrahepatic ductal dilation.  IMPRESSION: Heterogeneous echotexture of the liver may reflect fatty infiltration. Given the elevated hepatic function studies, abdominal CT scanning or MRI may be useful next steps.   Electronically Signed   By: David  Martinique   On: Aug 01, 2013 01:55    Scheduled Meds: . allopurinol  300  mg Oral Q breakfast  . colchicine  0.6 mg Oral BID  . docusate sodium  100 mg Oral BID  . finasteride  5 mg Oral QHS  . LORazepam  0.5-1 mg Oral BID  . metoprolol tartrate  12.5 mg Oral BID  . multivitamin  5 mL Oral Daily  . pantoprazole  40 mg Oral Q0600  . polyethylene glycol  17 g Oral Daily  . risperiDONE  0.5 mg Oral QHS  . sertraline  150 mg Oral Q breakfast  . sodium chloride  3 mL Intravenous Q12H   Continuous Infusions:   Time spent: 40 min  Delfina Redwood, MD [facility] Pager [phone] 7PM-7AM, please contact night-coverage at [email or web address], password [facility] 07/30/2013, 4:16 PM  LOS: 2 days

## 2013-07-30 NOTE — Progress Notes (Signed)
Spoke with pt at this time about wearing CPAP. He stated it was ready to go, he has our machine which is like his at home and his home tubing and mask. He stated he would place himself on and all he needed was water for chamber. RT filled chamber up with sterile water and pt stated, again, that is all he needed. He was advised to call at any time if he needs any more assistance. RT will continue to monitor.

## 2013-07-31 ENCOUNTER — Encounter (HOSPITAL_COMMUNITY): Payer: Self-pay | Admitting: *Deleted

## 2013-07-31 ENCOUNTER — Inpatient Hospital Stay (HOSPITAL_COMMUNITY): Payer: Medicare Other

## 2013-07-31 ENCOUNTER — Encounter (HOSPITAL_COMMUNITY)
Admission: EM | Disposition: A | Discharge status: Home / Self Care | Payer: Self-pay | Source: Home / Self Care | Attending: Internal Medicine

## 2013-07-31 DIAGNOSIS — I10 Essential (primary) hypertension: Secondary | ICD-10-CM

## 2013-07-31 DIAGNOSIS — D126 Benign neoplasm of colon, unspecified: Secondary | ICD-10-CM | POA: Diagnosis present

## 2013-07-31 HISTORY — PX: COLONOSCOPY: SHX5424

## 2013-07-31 HISTORY — PX: ESOPHAGOGASTRODUODENOSCOPY: SHX5428

## 2013-07-31 LAB — COMPREHENSIVE METABOLIC PANEL
ALT: 250 U/L — ABNORMAL HIGH (ref 0–53)
AST: 223 U/L — ABNORMAL HIGH (ref 0–37)
Albumin: 2.1 g/dL — ABNORMAL LOW (ref 3.5–5.2)
Alkaline Phosphatase: 443 U/L — ABNORMAL HIGH (ref 39–117)
BILIRUBIN TOTAL: 1.1 mg/dL (ref 0.3–1.2)
BUN: 20 mg/dL (ref 6–23)
CO2: 23 mEq/L (ref 19–32)
Calcium: 8.1 mg/dL — ABNORMAL LOW (ref 8.4–10.5)
Chloride: 102 mEq/L (ref 96–112)
Creatinine, Ser: 1 mg/dL (ref 0.50–1.35)
GFR calc Af Amer: 88 mL/min — ABNORMAL LOW (ref >90)
GFR calc non Af Amer: 76 mL/min — ABNORMAL LOW (ref >90)
Glucose, Bld: 119 mg/dL — ABNORMAL HIGH (ref 70–99)
Potassium: 3.9 mEq/L (ref 3.7–5.3)
Sodium: 140 mEq/L (ref 137–147)
Total Protein: 5.6 g/dL — ABNORMAL LOW (ref 6.0–8.3)

## 2013-07-31 LAB — CBC
HCT: 23.6 % — ABNORMAL LOW (ref 39.0–52.0)
HCT: 25 % — ABNORMAL LOW (ref 39.0–52.0)
Hemoglobin: 7.6 g/dL — ABNORMAL LOW (ref 13.0–17.0)
Hemoglobin: 8.1 g/dL — ABNORMAL LOW (ref 13.0–17.0)
MCH: 29 pg (ref 26.0–34.0)
MCH: 28.7 pg (ref 26.0–34.0)
MCHC: 32.2 g/dL (ref 30.0–36.0)
MCHC: 32.4 g/dL (ref 30.0–36.0)
MCV: 90.1 fL (ref 78.0–100.0)
MCV: 88.7 fL (ref 78.0–100.0)
PLATELETS: 453 10*3/uL — AB (ref 150–400)
PLATELETS: 430 10*3/uL — AB (ref 150–400)
RBC: 2.62 MIL/uL — ABNORMAL LOW (ref 4.22–5.81)
RBC: 2.82 MIL/uL — AB (ref 4.22–5.81)
RDW: 16.7 % — ABNORMAL HIGH (ref 11.5–15.5)
RDW: 16.9 % — ABNORMAL HIGH (ref 11.5–15.5)
WBC: 13.2 10*3/uL — AB (ref 4.0–10.5)
WBC: 11.8 10*3/uL — AB (ref 4.0–10.5)

## 2013-07-31 LAB — ANA: Anti Nuclear Antibody(ANA): NEGATIVE

## 2013-07-31 LAB — PREPARE RBC (CROSSMATCH)

## 2013-07-31 SURGERY — COLONOSCOPY
Anesthesia: Moderate Sedation

## 2013-07-31 MED ORDER — BUTAMBEN-TETRACAINE-BENZOCAINE 2-2-14 % EX AERO
INHALATION_SPRAY | CUTANEOUS | Status: DC | PRN
  Administered 2013-07-31: 2 via TOPICAL

## 2013-07-31 MED ORDER — FUROSEMIDE 10 MG/ML IJ SOLN
40.0000 mg | Freq: Once | INTRAMUSCULAR | Status: DC
  Filled 2013-07-31: qty 4

## 2013-07-31 MED ORDER — METOPROLOL TARTRATE 1 MG/ML IV SOLN
2.5000 mg | Freq: Once | INTRAVENOUS | Status: AC
  Administered 2013-07-31: 2.5 mg via INTRAVENOUS
  Filled 2013-07-31: qty 5

## 2013-07-31 MED ORDER — BENZONATATE 100 MG PO CAPS
200.0000 mg | ORAL_CAPSULE | Freq: Three times a day (TID) | ORAL | Status: DC
  Administered 2013-07-31 – 2013-08-04 (×12): 200 mg via ORAL
  Filled 2013-07-31 (×16): qty 2

## 2013-07-31 MED ORDER — FENTANYL CITRATE 0.05 MG/ML IJ SOLN
INTRAMUSCULAR | Status: DC | PRN
  Administered 2013-07-31 (×3): 25 ug via INTRAVENOUS

## 2013-07-31 MED ORDER — MIDAZOLAM HCL 10 MG/2ML IJ SOLN
INTRAMUSCULAR | Status: DC | PRN
  Administered 2013-07-31: 2 mg via INTRAVENOUS
  Administered 2013-07-31: 1 mg via INTRAVENOUS
  Administered 2013-07-31: 2 mg via INTRAVENOUS

## 2013-07-31 MED ORDER — DIPHENHYDRAMINE HCL 50 MG/ML IJ SOLN
INTRAMUSCULAR | Status: AC
  Filled 2013-07-31: qty 1

## 2013-07-31 MED ORDER — MIDAZOLAM HCL 5 MG/ML IJ SOLN
INTRAMUSCULAR | Status: AC
  Filled 2013-07-31: qty 3

## 2013-07-31 MED ORDER — FENTANYL CITRATE 0.05 MG/ML IJ SOLN
INTRAMUSCULAR | Status: AC
  Filled 2013-07-31: qty 4

## 2013-07-31 NOTE — Progress Notes (Signed)
Patient seen and examined and agree with assessment and plan as outlined by Kerin Ransom, PA-C.  Awaiting results of endoscopy and final recs from GI on starting anticoagulation.  Now off colchicine due to elevated LFTs with no recurrent CP.

## 2013-07-31 NOTE — Progress Notes (Signed)
Chart reviewed.  [facility] PROGRESS NOTE  [name] EXB:[id number] DOB: 1946-02-26 DOA: 07/28/2013 PCP: [name], MD  Assessment/Plan: Recent UGI bleed with continued heme positive stool -xarelto held -Had upper GI bleed on recent hospitalization, undergoing EGD, procedure performed by Dr. Henrene Pastor which showed superficial gastric ulcers. Patient was discharged on Protonix 40 mg by mouth twice a day -Patient presenting with hypotension, orthostasis, having an episode of black tarry stools For colonoscopy/EGD today. Ok to proceed per cardiology. See below. Discussed with Ms. Lelon Frohlich.  PAF: this am, back in A fib, rvr. Rate around 130. Discussed with Dr. Radford Pax. Apparently, patient has refused a few doses of oral metoprolol. Will give IV metoprolol and his oral dose this morning as blood pressure tolerates. As long as rate can be controlled, okay to proceed with procedure.  Acute on chronic anemia:  Given a fib, RVR, symptomatic will give 1 unit PRBC. Pt agrees.  Hypotension Resolved.   History of pulmonary embolism. xarelto held.  Would not place IVC filter unless definate evidence of recurrent GIB  History of large pericardial effusion in setting of pericarditis -Repeat transthoracic echocardiogram today showed small circumferential pericardial effusion. Per Dr. Radford Pax, okay to stop colchicine.   Generalized weakness/fatigue See above. Will transfuse a unit of blood.  Elevated transaminases US shows only fatty liver.  Shock liver versus medication related. No significant change today.   Leukocytosis Resolving without abx  Chronic cough for months: robitussin hasn't helped. dayquil does sometimes. Has chronic postnasal drip and seasonal allergies. May be sinus related. Add flonase and claritin. Add prn tessalon. CXR negative    Code Status: Full Code Family Communication: multiple at bedside 4/29 Disposition Plan: home when  stable   Consultants:  Cardiology  GI   HPI/Subjective: Feels palpitations. No chest pain. Feels fatigued still with dyspnea on exertion.   Objective: Filed Vitals:   07/31/13 0918  BP: 102/68  Pulse: 126  Temp:   Resp: 20    Intake/Output Summary (Last 24 hours) at 07/31/13 1011 Last data filed at 07/30/13 1619  Gross per 24 hour  Intake      0 ml  Output    200 ml  Net   -200 ml   Filed Weights   07/29/13 0229  Weight: 134.1 kg (295 lb 10.2 oz)    telemetry: Atrial fibrillation, rate about 1:30.  Exam:   General:  appear somewhat anxious. Breathing nonlabored. Lying in bed. Still with frequent hacking cough.   HEENT: pale conjunctiva. No sinus tenderness  Cardiovascular: irregularly irregular with rapid rate   Respiratory: clear to auscultation bilaterally without wheezes rhonchi or rales   Abdomen: Soft, nontender nondistended  Ext: 1+ edema  Data Reviewed: Basic Metabolic Panel:  Recent Labs Lab 07/25/13 0553 07/28/13 1130 07/29/13 0510 07/30/13 0500 07/31/13 0510  NA 144 135 139 140 140  K 3.5* 3.9 3.7 4.2 3.9  CL 105 100 102 104 102  CO2 27 22 21 25 23   GLUCOSE 105* 138* 120* 116* 119*  BUN 20 28* 27* 21 20  CREATININE 1.11 1.4 1.17 1.09 1.00  CALCIUM 8.3* 8.0* 8.1* 8.0* 8.1*   Liver Function Tests:  Recent Labs Lab 07/28/13 1130 07/30/13 0500 07/31/13 0510  AST 359* 221* 223*  ALT 224* 223* 250*  ALKPHOS 382* 403* 443*  BILITOT 1.3* 1.0 1.1  PROT 6.3 5.6* 5.6*  ALBUMIN 2.7* 2.1* 2.1*   No results found for this basename: LIPASE, AMYLASE,  in the last 168 hours No  results found for this basename: AMMONIA,  in the last 168 hours CBC:  Recent Labs Lab 07/25/13 0553 07/28/13 1130 07/29/13 0510 07/29/13 1610 07/30/13 0500 07/30/13 2018 07/31/13 0510  WBC 13.0* 21.8 Repeated and verified X2.* 15.7*  --  11.7*  --  13.2*  NEUTROABS  --  18.9*  --   --   --   --   --   HGB 8.6* 9.3* 8.7* 8.1* 7.7* 8.1* 7.6*  HCT 26.5*  28.2* 26.7* 25.0* 24.4* 25.5* 23.6*  MCV 90.8 89.6 89.6  --  92.1  --  90.1  PLT 405* 478.0* 402*  --  429*  --  453*   Cardiac Enzymes:  Recent Labs Lab 07/30/13 0500  CKTOTAL 21   BNP (last 3 results)  Recent Labs  07/13/13 1835  PROBNP 519.5*   CBG: No results found for this basename: GLUCAP,  in the last 168 hours  Recent Results (from the past 240 hour(s))  CULTURE, BLOOD (ROUTINE X 2)     Status: None   Collection Time    07/28/13 10:31 PM      Result Value Ref Range Status   Specimen Description BLOOD RIGHT ANTECUBITAL   Final   Special Requests     Final   Value: BOTTLES DRAWN AEROBIC AND ANAEROBIC 8CC BLUE 4CC RED   Culture  Setup Time     Final   Value: 07/29/2013 04:07     Performed at [facility]   Culture     Final   Value:        BLOOD CULTURE RECEIVED NO GROWTH TO DATE CULTURE WILL BE HELD FOR 5 DAYS BEFORE ISSUING A FINAL NEGATIVE REPORT     Performed at [facility]   Report Status PENDING   Incomplete  CULTURE, BLOOD (ROUTINE X 2)     Status: None   Collection Time    07/28/13 10:40 PM      Result Value Ref Range Status   Specimen Description BLOOD RIGHT FOREARM   Final   Special Requests BOTTLES DRAWN AEROBIC ONLY 4CC   Final   Culture  Setup Time     Final   Value: 07/29/2013 04:06     Performed at [facility]   Culture     Final   Value:        BLOOD CULTURE RECEIVED NO GROWTH TO DATE CULTURE WILL BE HELD FOR 5 DAYS BEFORE ISSUING A FINAL NEGATIVE REPORT     Performed at [facility]   Report Status PENDING   Incomplete  URINE CULTURE     Status: None   Collection Time    07/28/13 11:04 PM      Result Value Ref Range Status   Specimen Description URINE, CATHETERIZED   Final   Special Requests NONE   Final   Culture  Setup Time     Final   Value: 07/29/2013 00:07     Performed at [place]     Final   Value: NO GROWTH     Performed at [facility]   Culture     Final    Value: NO GROWTH     Performed at [facility]   Report Status 07/30/2013 FINAL   Final     Studies: No results found.  Scheduled Meds: . docusate sodium  100 mg Oral BID  . finasteride  5 mg Oral QHS  . fluticasone  2 spray Each Nare Daily  . loratadine  10 mg Oral Daily  . LORazepam  0.5-1 mg Oral BID  . metoprolol tartrate  12.5 mg Oral BID  . multivitamin  5 mL Oral Daily  . pantoprazole  40 mg Oral Q0600  . polyethylene glycol  17 g Oral Daily  . risperiDONE  0.5 mg Oral QHS  . sertraline  150 mg Oral Q breakfast  . sodium chloride  3 mL Intravenous Q12H   Continuous Infusions: . sodium chloride 20 mL/hr at 07/30/13 1729    Time spent: 40 min  Delfina Redwood, MD [facility] Pager [phone] 7PM-7AM, please contact night-coverage at [email or web address], password [facility] 07/31/2013, 10:11 AM  LOS: 3 days

## 2013-07-31 NOTE — Progress Notes (Signed)
Pt stated that he would self administer CPAP when ready.  PT to notify RT if any complications should arise.  RT to monitor and assess as needed.

## 2013-07-31 NOTE — Progress Notes (Signed)
Subjective:  Weak, tolerating GI prep  Objective:  Vital Signs in the last 24 hours: Temp:  [98.8 F (37.1 C)-99.5 F (37.5 C)] 99 F (37.2 C) (04/30 0704) Pulse Rate:  [73-96] 89 (04/30 0704) Resp:  [17-22] 18 (04/30 0704) BP: (109-116)/(52-72) 116/70 mmHg (04/30 0704) SpO2:  [91 %-96 %] 92 % (04/30 0704)  Intake/Output from previous day:  Intake/Output Summary (Last 24 hours) at 07/31/13 0802 Last data filed at 07/30/13 1619  Gross per 24 hour  Intake    240 ml  Output    500 ml  Net   -260 ml    Physical Exam: General appearance: alert, cooperative, no distress and moderately obese Lungs: clear to auscultation bilaterally Heart: regular rate and rhythm   Rate: 88  Rhythm: normal sinus rhythm  Lab Results:  Recent Labs  07/30/13 0500 07/30/13 2018 07/31/13 0510  WBC 11.7*  --  13.2*  HGB 7.7* 8.1* 7.6*  PLT 429*  --  453*    Recent Labs  07/30/13 0500 07/31/13 0510  NA 140 140  K 4.2 3.9  CL 104 102  CO2 25 23  GLUCOSE 116* 119*  BUN 21 20  CREATININE 1.09 1.00   No results found for this basename: TROPONINI, CK, MB,  in the last 72 hours No results found for this basename: INR,  in the last 72 hours  Imaging: Imaging results have been reviewed  Cardiac Studies: Study Date: 07/29/2013 Gender: M Age: 68 Height: 182.9cm Weight: 134.1kg BSA: 2.59m^2 Pt. Status: Room: A07C  ------------------------------------------------------------ LV EF: 60% - 65%  ------------------------------------------------------------ Indications: Pericardial effusion 423.9. Limited to follow up pericardial window.  ------------------------------------------------------------ History: PMH: Atrial fibrillation. Congestive heart failure. Risk factors: Former tobacco use. Hypertension. Dyslipidemia.  ------------------------------------------------------------ Study Conclusions  - Left ventricle: The cavity size was normal. Systolic function was  normal. The estimated ejection fraction was in the range of 60% to 65%. - Inferior vena cava: The vessel was normal in size; the respirophasic diameter changes were in the normal range (= 50%); findings are consistent with normal central venous pressure. - Pericardium, extracardiac: Small cirumferential pericardial effusion. There appears to be a gelatinous material overlying the apex of the heart in the pericardium which may be thrombus, epicardial fat or tumor. No signs of tamponade physiology. Impressions:  - Compared to the prior recent echo on 07/17/13, there has been a small decrease in pericardial fluid.   Assessment/Plan:  68 y/o male with morbid obesity s/p Lap-band Jan 2015, admitted last week with small PE, GIB and pericardial effusion requiring drainage now presents with recurrent fatigue and hypotension with suspected recurrent GI bleed as well as PAF, and elevated LFTs (?liver ischemia secondary to hypotension).    Principal Problem:   Upper GI bleed- recurrent Active Problems:   Anemia   Pulmonary thromboembolism- 07/16/13   Pericardial effusion s/p pericardial window 07/18/13 400cc   Pericarditis   Hepatitis   Chronic anticoagulation- on hold   Gastric ulcer with hemorrhage 07/20/13   PAF (paroxysmal atrial fibrillation) with RVR   Hypotension, unspecified   SLEEP APNEA- on C-pap   Morbid obesity   H/O laparoscopic adjustable gastric banding & hiatal hernia repair 04/08/13   Leukocytosis    PLAN:  Colchicine (elevated LFTs) and Xarelto stopped. He is for endoscopy and colonoscopy today. Consider transfusion- Hgb down to 7.6.  Consider IVC filter if he will not be a candidate for anticoagulation.   Kerin Ransom PA-C Beeper [phone] 07/31/2013, 8:02 AM

## 2013-07-31 NOTE — Progress Notes (Signed)
Transfused 1 unit of packed red blood cell. No complications, no reaction suspected. VS stable. Roxan Hockey

## 2013-07-31 NOTE — Progress Notes (Signed)
Patient converted back to NSR at 1308. HR in 70's. Roxan Hockey, RN

## 2013-07-31 NOTE — Progress Notes (Signed)
Daily Rounding Note  07/31/2013, 8:38 AM  LOS: 3 days   SUBJECTIVE:       Completed prep about 3o minutes ago, did not see any blood, stools not entirely clear yet. Feels ok, not dizzy, no chest pain, no nausea, no abd pain.   OBJECTIVE:         Vital signs in last 24 hours:    Temp:  [98.8 F (37.1 C)-99.5 F (37.5 C)] 99 F (37.2 C) (04/30 0704) Pulse Rate:  [73-96] 89 (04/30 0704) Resp:  [17-22] 18 (04/30 0704) BP: (109-116)/(52-72) 116/70 mmHg (04/30 0704) SpO2:  [91 %-96 %] 92 % (04/30 0704) Last BM Date: 07/29/13 (per patient) General: looks unwell. Alert and comfortable   Heart: Irreg, Irreg, rate accelerated to 120s on monitor Chest: clear bil Abdomen: soft, NT, ND.  Active BS  Extremities: no CCE Neuro/Psych:  Pleasant, relaxed, comfortable.   Intake/Output from previous day: 04/29 0701 - 04/30 0700 In: 240 [P.O.:240] Out: 500 [Urine:500]  Intake/Output this shift:    Lab Results:  Recent Labs  07/29/13 0510  07/30/13 0500 07/30/13 2018 07/31/13 0510  WBC 15.7*  --  11.7*  --  13.2*  HGB 8.7*  < > 7.7* 8.1* 7.6*  HCT 26.7*  < > 24.4* 25.5* 23.6*  PLT 402*  --  429*  --  453*  < > = values in this interval not displayed. BMET  Recent Labs  07/29/13 0510 07/30/13 0500 07/31/13 0510  NA 139 140 140  K 3.7 4.2 3.9  CL 102 104 102  CO2 21 25 23   GLUCOSE 120* 116* 119*  BUN 27* 21 20  CREATININE 1.17 1.09 1.00  CALCIUM 8.1* 8.0* 8.1*   LFT  Recent Labs  07/28/13 1130 07/30/13 0500 07/31/13 0510  PROT 6.3 5.6* 5.6*  ALBUMIN 2.7* 2.1* 2.1*  AST 359* 221* 223*  ALT 224* 223* 250*  ALKPHOS 382* 403* 443*  BILITOT 1.3* 1.0 1.1  BILIDIR 0.6*  --   --    PT/INR No results found for this basename: LABPROT, INR,  in the last 72 hours Hepatitis Panel  Recent Labs  07/29/13 0126  HEPBSAG NEGATIVE  HCVAB NEGATIVE  HEPAIGM NON REACTIVE  HEPBIGM NON REACTIVE     ASSESMENT:    * Elevated LFTs without jaundice. Acute hepatitis panel last week negative. GB is out. No abdominal pain. Fatty liver on ultrasound. Wonder if it is Colchicine (added last admission) or some other medication? Sequelae of pericardial window?  * FOBT +. Recent GIB in setting of Xarelto therapy. Stools chronically dark while takeing MVI with Iron.  EGD 4/21 with superficial gastric ulcer. On PPI at home, now BID IV Protonix.  H Pylori +, has yet to be treated.  * Normocytic anemia. Hgb down 1.5 grams in last 3 days. No transfusions to date. Received 2 units PRBCs last week. Hgb relatively stable.  * Worsening cough and dyspnea. ? Weakness s/e of BB: on Metoprolol now,  * Leukocytosis. No fever. No ABX currently. Declining spontaneously  * Hx PE.  Atrial fibrillation, remains in NSR. Xarelto held briefly last week but restarted at discharge. Is again on hold  * 4/17 pericardial window, drainage of pericardial effusion felt to be viral in origin. On Colchicine.  * 04/08/13 lap band and small HH repair (NOT A WRAP), steady weight loss since. Has not had fluid infused into the lap band since initial placement.  * Chronic  constipation, better with daily colace and Miralax.    PLAN   *  Colonoscopy and EGD today.    Vena Rua  07/31/2013, 8:38 AM Pager: [phone]  1 PM addendum Pt has been having issues with recurrent A fib this AM, got extra dose of Metoprolol in addition to the standing dose.  Rate down from 130s to low 100s currently.  BP to 70s/ 59, currently 92/56.  Dr Conley Canal ordered one unit of PRBC, transfusing currently.  GI may need to hold of on Colonoscopy/EGD.

## 2013-07-31 NOTE — Op Note (Signed)
[facility] [place], [id number]   ENDOSCOPY PROCEDURE REPORT  PATIENT: [name], [name]  MR#: [id number] BIRTHDATE: 1945-05-04 , 70  yrs. old GENDER: Male ENDOSCOPIST: Gatha Mayer, MD, [facility] PROCEDURE DATE:  07/31/2013 PROCEDURE:  EGD, diagnostic ASA CLASS:     Class III INDICATIONS:  Melena.  ? of w/ decline in Hgb - needs anti-coag Tx for PE MEDICATIONS: There was residual sedation effect present from prior procedure, Fentanyl 25 mcg IV, and Versed 1mg  IV TOPICAL ANESTHETIC: Cetacaine Spray  DESCRIPTION OF PROCEDURE: After the risks benefits and alternatives of the procedure were thoroughly explained, informed consent was obtained.  The Pentax Gastroscope [id number] endoscope was introduced through the mouth and advanced to the second portion of the duodenum. Without limitations.  The instrument was slowly withdrawn as the mucosa was fully examined.      The upper, middle and distal third of the esophagus were carefully inspected and no abnormalities were noted.  The z-line was well seen at the GEJ.  The endoscope was pushed into the fundus which was normal including a retroflexed view.  The antrum, gastric body, first and second part of the duodenum were unremarkable. Retroflexed views revealed no abnormalities.     The scope was then withdrawn from the patient and the procedure completed.  COMPLICATIONS: There were no complications. ENDOSCOPIC IMPRESSION: Normal EGD gastric ulcer dx earlier this month is gone  RECOMMENDATIONS: do not know why Hgb has declined - will do unenhanced CT scan abd/pelvis to exclude a hematoma - no good evidence for a GI hemorrhage before/during this admit in my opinion    eSigned:  Gatha Mayer, MD, [facility] 07/31/2013 3:37 PM

## 2013-07-31 NOTE — Progress Notes (Signed)
PT Cancellation Note  Patient Details Name: DAUNTE OESTREICH MRN: [id number] DOB: 16-Jul-1945   Cancelled Treatment:    Reason Eval/Treat Not Completed: Medical issues which prohibited therapy.  Pt in afib and with low Hgb getting ready to receive blood.  Will return tomorrow per nursing request.    Christianne Dolin 07/31/2013, 12:59 PM  [facility] [phone] (pager)

## 2013-07-31 NOTE — Op Note (Signed)
[facility] [place], [address]   COLONOSCOPY PROCEDURE REPORT  PATIENT: [name], [name]  MR#: [id number] BIRTHDATE: 1945-09-06 , 36  yrs. old GENDER: Male ENDOSCOPIST: Gatha Mayer, MD, [facility] PROCEDURE DATE:  07/31/2013 PROCEDURE:   Colonoscopy with biopsy and snare polypectomy First Screening Colonoscopy - Avg.  risk and is 50 yrs.  old or older - No.  Prior Negative Screening - Now for repeat screening. N/A  History of Adenoma - Now for follow-up colonoscopy & has been > or = to 3 yrs.  Yes hx of adenoma.  Has been 3 or more years since last colonoscopy.  Polyps Removed Today? Yes. ASA CLASS:   Class III INDICATIONS:melena.   ? of, w decline in Hgb, has PE and needs anti-coag tx MEDICATIONS: Fentanyl 50 mcg IV and Versed 4 mg IV  DESCRIPTION OF PROCEDURE:   After the risks benefits and alternatives of the procedure were thoroughly explained, informed consent was obtained.  A digital rectal exam revealed no abnormalities of the rectum.   The Pentax Adult Colon [id number] endoscope was introduced through the anus and advanced to the terminal ileum which was intubated for a short distance. No adverse events experienced.   The quality of the prep was adequate, using MoviPrep  The instrument was then slowly withdrawn as the colon was fully examined.  COLON FINDINGS: Two diminutive sessile polyps were found at the cecum.  A polypectomy was performed with cold forceps and with a cold snare.  The resection was complete and the polyp tissue was completely retrieved.   An innumerable number of diminutive polyps were found in the terminal ileum.  Multiple biopsies were performed.   Severe diverticulosis was noted in the sigmoid colon. The colon mucosa was otherwise normal.  Retroflexed views revealed no abnormalities. The time to cecum=4 minutes 0 seconds. Withdrawal time=17 minutes 0 seconds.  The scope was withdrawn and the procedure  completed. COMPLICATIONS: There were no complications.  ENDOSCOPIC IMPRESSION: 1.   Two diminutive sessile polyps were found at the cecum; polypectomy was performed with cold forceps and with a cold snare 2.   Innumerable number of diminutive polyps were found in the terminal ileum; multiple biopsies were performed 3.   Severe diverticulosis was noted in the sigmoid colon 4.   The colon mucosa was otherwise normal - adequate prep - hx adenoms 2010  RECOMMENDATIONS: Upper endoscopy next  eSigned:  Gatha Mayer, MD, [facility] 07/31/2013 3:33 PM

## 2013-08-01 ENCOUNTER — Encounter (HOSPITAL_COMMUNITY): Payer: Self-pay | Admitting: Internal Medicine

## 2013-08-01 DIAGNOSIS — I4891 Unspecified atrial fibrillation: Secondary | ICD-10-CM

## 2013-08-01 DIAGNOSIS — I2699 Other pulmonary embolism without acute cor pulmonale: Secondary | ICD-10-CM

## 2013-08-01 DIAGNOSIS — I959 Hypotension, unspecified: Secondary | ICD-10-CM

## 2013-08-01 DIAGNOSIS — R748 Abnormal levels of other serum enzymes: Secondary | ICD-10-CM | POA: Diagnosis present

## 2013-08-01 DIAGNOSIS — R740 Nonspecific elevation of levels of transaminase and lactic acid dehydrogenase [LDH]: Secondary | ICD-10-CM

## 2013-08-01 DIAGNOSIS — K921 Melena: Secondary | ICD-10-CM

## 2013-08-01 DIAGNOSIS — I509 Heart failure, unspecified: Secondary | ICD-10-CM

## 2013-08-01 DIAGNOSIS — I319 Disease of pericardium, unspecified: Secondary | ICD-10-CM

## 2013-08-01 DIAGNOSIS — R7402 Elevation of levels of lactic acid dehydrogenase (LDH): Secondary | ICD-10-CM

## 2013-08-01 LAB — CBC
HEMATOCRIT: 28 % — AB (ref 39.0–52.0)
Hemoglobin: 8.8 g/dL — ABNORMAL LOW (ref 13.0–17.0)
MCH: 28 pg (ref 26.0–34.0)
MCHC: 31.4 g/dL (ref 30.0–36.0)
MCV: 89.2 fL (ref 78.0–100.0)
Platelets: 386 10*3/uL (ref 150–400)
RBC: 3.14 MIL/uL — ABNORMAL LOW (ref 4.22–5.81)
RDW: 17 % — AB (ref 11.5–15.5)
WBC: 11.7 10*3/uL — AB (ref 4.0–10.5)

## 2013-08-01 LAB — PREPARE RBC (CROSSMATCH)

## 2013-08-01 LAB — CERULOPLASMIN: CERULOPLASMIN: 41 mg/dL — AB (ref 18–36)

## 2013-08-01 LAB — MITOCHONDRIAL ANTIBODIES: MITOCHONDRIAL M2 AB, IGG: 0.54 (ref <0.91)

## 2013-08-01 LAB — ALPHA-1-ANTITRYPSIN: A-1 Antitrypsin, Ser: 328 mg/dL — ABNORMAL HIGH (ref 83–199)

## 2013-08-01 LAB — ANTI-SMOOTH MUSCLE ANTIBODY, IGG: F-ACTIN AB IGG: 7 U (ref <20)

## 2013-08-01 MED ORDER — FUROSEMIDE 20 MG PO TABS
20.0000 mg | ORAL_TABLET | Freq: Every day | ORAL | Status: DC
  Administered 2013-08-02 – 2013-08-04 (×3): 20 mg via ORAL
  Filled 2013-08-01 (×3): qty 1

## 2013-08-01 MED ORDER — RIVAROXABAN 20 MG PO TABS: 20.0000 mg | ORAL_TABLET | Freq: Two times a day (BID) | ORAL | Status: DC

## 2013-08-01 MED ORDER — METOPROLOL TARTRATE 1 MG/ML IV SOLN: 5.0000 mg | Freq: Once | INTRAVENOUS | Status: DC

## 2013-08-01 MED ORDER — FUROSEMIDE 10 MG/ML IJ SOLN
40.0000 mg | Freq: Once | INTRAMUSCULAR | Status: AC
  Administered 2013-08-01: 40 mg via INTRAVENOUS

## 2013-08-01 MED ORDER — APIXABAN 5 MG PO TABS
5.0000 mg | ORAL_TABLET | Freq: Two times a day (BID) | ORAL | Status: DC
  Administered 2013-08-02 – 2013-08-04 (×5): 5 mg via ORAL
  Filled 2013-08-01 (×6): qty 1

## 2013-08-01 MED ORDER — PANTOPRAZOLE SODIUM 40 MG PO TBEC
40.0000 mg | DELAYED_RELEASE_TABLET | Freq: Two times a day (BID) | ORAL | Status: DC
  Administered 2013-08-01 – 2013-08-04 (×6): 40 mg via ORAL
  Filled 2013-08-01 (×6): qty 1

## 2013-08-01 MED ORDER — RIVAROXABAN 20 MG PO TABS
20.0000 mg | ORAL_TABLET | Freq: Every day | ORAL | Status: DC
Start: 2013-08-01 — End: 2013-08-01
  Filled 2013-08-01: qty 1

## 2013-08-01 NOTE — Progress Notes (Signed)
Daily Rounding Note  08/01/2013, 8:22 AM  LOS: 4 days   SUBJECTIVE:       No GI complaints.  Cough better but persists  OBJECTIVE:         Vital signs in last 24 hours:    Temp:  [97.3 F (36.3 C)-98.8 F (37.1 C)] 97.3 F (36.3 C) (05/01 0453) Pulse Rate:  [60-130] 115 (05/01 0500) Resp:  [16-34] 18 (05/01 0453) BP: (78-121)/(52-99) 114/58 mmHg (05/01 0500) SpO2:  [93 %-99 %] 96 % (05/01 0453) Last BM Date: 07/31/13 General: looks better, not well.    Heart: Irreg, not rapid currently Chest: clear, + cough Abdomen: soft, obese, active BS, NT.  Extremities: no CCE Neuro/Psych:  Oriented x 3, relaxed, cooperative.   Intake/Output from previous day: 04/30 0701 - 05/01 0700 In: 609.2 [I.V.:250; Blood:359.2] Out: 675 [Urine:675]  Intake/Output this shift:    Lab Results:  Recent Labs  07/30/13 0500 07/30/13 2018 07/31/13 0510 07/31/13 2100  WBC 11.7*  --  13.2* 11.8*  HGB 7.7* 8.1* 7.6* 8.1*  HCT 24.4* 25.5* 23.6* 25.0*  PLT 429*  --  453* 430*   BMET  Recent Labs  07/30/13 0500 07/31/13 0510  NA 140 140  K 4.2 3.9  CL 104 102  CO2 25 23  GLUCOSE 116* 119*  BUN 21 20  CREATININE 1.09 1.00  CALCIUM 8.0* 8.1*   LFT  Recent Labs  07/30/13 0500 07/31/13 0510  PROT 5.6* 5.6*  ALBUMIN 2.1* 2.1*  AST 221* 223*  ALT 223* 250*  ALKPHOS 403* 443*  BILITOT 1.0 1.1    Studies/Results: Ct Abdomen Pelvis Wo Contrast 07/31/2013    FINDINGS: Lung Bases: Small to moderate left pleural effusion. Trace right pleural effusion. Small amount of intermediate attenuation pericardial fluid and/or thickening. No associated pericardial calcification in the visualized portions of the thorax. Probable passive atelectasis in much of the left lower lobe.  Abdomen/Pelvis: There is a LapBand in position adjacent to the gastroesophageal junction. The unenhanced appearance of the liver, pancreas, spleen and bilateral  adrenal glands is unremarkable. Status post cholecystectomy. Numerous low-attenuation lesions in the kidneys bilaterally and in the renal pelvises bilaterally are incompletely characterized on today's non contrast CT examination, but are statistically likely to represent renal cysts and renal sinus cysts, the largest of which is in the right renal pelvis measuring 4.2 cm in diameter.  No high attenuation fluid within the peritoneal cavity or retroperitoneum to suggest significant hemorrhage. No significant volume of ascites. No pneumoperitoneum. No pathologic distention of small bowel. Extensive colonic diverticulosis, particularly of the sigmoid colon, without surrounding inflammatory changes to suggest acute diverticulitis at this time. No pathologically enlarged lymph nodes are noted in the abdomen or pelvis on today's non contrast CT examination. Atherosclerosis throughout the abdominal and pelvic vasculature, without definite aneurysm. Prostate gland and urinary bladder are unremarkable in appearance.  Musculoskeletal: There are no aggressive appearing lytic or blastic lesions noted in the visualized portions of the skeleton.  IMPRESSION: 1. No occult hemorrhage identified. 2. Small amount of intermediate attenuation pericardial fluid and/or thickening. Clinical correlation for signs symptoms of acute pericarditis is recommended. 3. Small to moderate left and passed trace right-sided pleural effusions layering dependently, with passive atelectasis in much of the left lower lobe. 4. Extensive colonic diverticulosis without findings to suggest acute diverticulitis at this time. 5. Normal appendix. 6. Numerous renal lesions bilaterally, favored to reflect a combination of renal cysts and  renal pelvis cysts. 7. Additional incidental findings, as above.   Electronically Signed   By: Vinnie Langton M.D.   On: 07/31/2013 18:41   Scheduled Meds: . benzonatate  200 mg Oral TID  . finasteride  5 mg Oral QHS  .  fluticasone  2 spray Each Nare Daily  . furosemide  40 mg Intravenous Once  . loratadine  10 mg Oral Daily  . LORazepam  0.5-1 mg Oral BID  . metoprolol tartrate  12.5 mg Oral BID  . multivitamin  5 mL Oral Daily  . pantoprazole  40 mg Oral Q0600  . risperiDONE  0.5 mg Oral QHS  . sertraline  150 mg Oral Q breakfast  . sodium chloride  3 mL Intravenous Q12H   Continuous Infusions:  PRN Meds:.chlorpheniramine-HYDROcodone, ondansetron  ASSESMENT:   *  FOBT +. EGD 4/21: superficial gastric ulcer EGD 4/30: normal, ulcer not seen Colonoscopy 4/30:  CT abdomen 4/30: no occult hemorrhage.  + H pylori ab serum and breath test, has yet to be treated.  PO iron in the MVI was discontinued in order to eliminate the confusion of dark stools and false FOBT +  *  Anemia. hgb stable after one unit PRBCs yesterday.   *  Abnormal LFTs/hepatitis.  GB out, bile duct not dilated, no biliary issues except fatty liver on CT or on 2 separate ultrasounds.  Acute hepatitis panel negative x 2.  ANA negative, smooth muscle Ab negative , Alph 1 AT 328 (rr 83 - 199) , Ceruloplasmin 41 (rr 18 - 36), mitochondrial Ab pending. ? Liver hypoperfusion/shock vs meds (colchicine?)  *  Atrial Fib, hx PE Xarelto on hold.  No GI reason not to restart.   *  S/p pericardial window 07/18/13 for large pericardial effusion (deemed viral)  *  S/p lap band 04/08/13  *  Chronic cough.   *  Low serum calcium, however when corrected for calcium level, it is normal.    PLAN   *  LFTs in AM    Vena Rua  08/01/2013, 8:22 AM Pager: [place] Attending  I have also seen and assessed the patient and agree with the above note.  OK to restart Xarelto No cause of bleeding seen so far. Transaminase and alk phos elevation persist - liver fx ok  Can have these f/u by PCP and if nmot improving then see Dr. Henrene Pastor of GI - doubt a primary liver problem though he does have fatty liver and could have had ischemia  or problem from colchicine,  - do not think any major problems.  Gatha Mayer, MD, [facility] [phone] (pager) 08/01/2013 4:20 PM

## 2013-08-01 NOTE — Care Management Note (Signed)
    Page 1 of 1   08/04/2013     3:24:18 PM CARE MANAGEMENT NOTE 08/04/2013  Patient:  [name], [name]   Account Number:  [id number]  Date Initiated:  07/29/2013  Documentation initiated by:  [facility]  Subjective/Objective Assessment:   Hypotension, hx Pericarditis, PE, CHF     Action/Plan:   PTA pt resides at home with spouse and is independent.   Anticipated DC Date:  08/03/2013   Anticipated DC Plan:  HOME/SELF CARE      DC Planning Services  CM consult  Medication Assistance      Choice offered to / List presented to:             Status of service:  Completed, signed off Medicare Important Message given?  YES (If response is "NO", the following Medicare IM given date fields will be blank) Date Medicare IM given:  07/28/2013 Date Additional Medicare IM given:  08/01/2013  Discharge Disposition:  HOME/SELF CARE  Per UR Regulation:  Reviewed for med. necessity/level of care/duration of stay  If discussed at [place] of Stay Meetings, dates discussed:    Comments:  08/04/13 Ellan Lambert, RN, BSN [phone] Pt for dc home today with wife.  30 day free trial card given for Eliquis.  HHPT ordered by MD; pt states he prefers to do Outpt Physical Therapy if possible.  Will discuss with MD; if agreeable will submit OP PT Referral form; Rehab facility to call pt for appt.  08/01/13 Ellan Lambert, RN, BSN [phone] Al[facility]a Reasons IS NOT ON THE FORMULARY-ALT ARE ELIQUIS AND PRODAXA AND THE COPAY WILL BE $39. WARFARIN SODIUM IS AVAILABLE ALSO, COPAY WILL BE $0    08/01/13 Ellan Lambert, RN, BSN [phone] Pt states he paid full price for Xarelto when he filled it at drug store.  Uncertain why [employer] would not pay for Xarelto therapy.  Will check coverage for med; Pt's pharmacy is [facility] on [address].

## 2013-08-01 NOTE — Progress Notes (Signed)
SUBJECTIVE:  Complains of cough.  Denies CP or SOB  OBJECTIVE:   Vitals:   Filed Vitals:   08/01/13 0453 08/01/13 0456 08/01/13 0458 08/01/13 0500  BP: 103/63 96/60 107/92 114/58  Pulse: 60 102 130 115  Temp: 97.3 F (36.3 C)     TempSrc: Oral     Resp: 18     Height:      Weight:      SpO2: 96%      I&O's:   Intake/Output Summary (Last 24 hours) at 08/01/13 1040 Last data filed at 07/31/13 2234  Gross per 24 hour  Intake 609.17 ml  Output    225 ml  Net 384.17 ml   TELEMETRY: Reviewed telemetry pt in NSR - converted this am     PHYSICAL EXAM General: Well developed, well nourished, in no acute distress Head: Eyes PERRLA, No xanthomas.   Normal cephalic and atramatic  Lungs:   Clear bilaterally to auscultation and percussion. Heart:   HRRR S1 S2 Pulses are 2+ & equal Abdomen: Bowel sounds are positive, abdomen soft and non-tender without masses  Extremities:   No clubbing, cyanosis or edema.  DP +1 Neuro: Alert and oriented X 3. Psych:  Good affect, responds appropriately   LABS: Basic Metabolic Panel:  Recent Labs  07/30/13 0500 07/31/13 0510  NA 140 140  K 4.2 3.9  CL 104 102  CO2 25 23  GLUCOSE 116* 119*  BUN 21 20  CREATININE 1.09 1.00  CALCIUM 8.0* 8.1*   Liver Function Tests:  Recent Labs  07/30/13 0500 07/31/13 0510  AST 221* 223*  ALT 223* 250*  ALKPHOS 403* 443*  BILITOT 1.0 1.1  PROT 5.6* 5.6*  ALBUMIN 2.1* 2.1*   No results found for this basename: LIPASE, AMYLASE,  in the last 72 hours CBC:  Recent Labs  07/31/13 2100 08/01/13 0855  WBC 11.8* 11.7*  HGB 8.1* 8.8*  HCT 25.0* 28.0*  MCV 88.7 89.2  PLT 430* 386   Cardiac Enzymes:  Recent Labs  07/30/13 0500  CKTOTAL 21   BNP: No components found with this basename: POCBNP,  D-Dimer: No results found for this basename: DDIMER,  in the last 72 hours Hemoglobin A1C: No results found for this basename: HGBA1C,  in the last 72 hours Fasting Lipid Panel: No results  found for this basename: CHOL, HDL, LDLCALC, TRIG, CHOLHDL, LDLDIRECT,  in the last 72 hours Thyroid Function Tests: No results found for this basename: TSH, T4TOTAL, FREET3, T3FREE, THYROIDAB,  in the last 72 hours Anemia Panel: No results found for this basename: VITAMINB12, FOLATE, FERRITIN, TIBC, IRON, RETICCTPCT,  in the last 72 hours Coag Panel:   No results found for this basename: INR, PROTIME    RADIOLOGY: Ct Abdomen Pelvis Wo Contrast  07/31/2013   CLINICAL DATA:  Decreasing hemoglobin on Xarelto.  EXAM: CT ABDOMEN AND PELVIS WITHOUT CONTRAST  TECHNIQUE: Multidetector CT imaging of the abdomen and pelvis was performed following the standard protocol without intravenous contrast.  COMPARISON:  CT of the abdomen and pelvis 11/28/2012.  FINDINGS: Lung Bases: Small to moderate left pleural effusion. Trace right pleural effusion. Small amount of intermediate attenuation pericardial fluid and/or thickening. No associated pericardial calcification in the visualized portions of the thorax. Probable passive atelectasis in much of the left lower lobe.  Abdomen/Pelvis: There is a LapBand in position adjacent to the gastroesophageal junction. The unenhanced appearance of the liver, pancreas, spleen and bilateral adrenal glands is unremarkable. Status post cholecystectomy. Numerous  low-attenuation lesions in the kidneys bilaterally and in the renal pelvises bilaterally are incompletely characterized on today's non contrast CT examination, but are statistically likely to represent renal cysts and renal sinus cysts, the largest of which is in the right renal pelvis measuring 4.2 cm in diameter.  No high attenuation fluid within the peritoneal cavity or retroperitoneum to suggest significant hemorrhage. No significant volume of ascites. No pneumoperitoneum. No pathologic distention of small bowel. Extensive colonic diverticulosis, particularly of the sigmoid colon, without surrounding inflammatory changes to  suggest acute diverticulitis at this time. No pathologically enlarged lymph nodes are noted in the abdomen or pelvis on today's non contrast CT examination. Atherosclerosis throughout the abdominal and pelvic vasculature, without definite aneurysm. Prostate gland and urinary bladder are unremarkable in appearance.  Musculoskeletal: There are no aggressive appearing lytic or blastic lesions noted in the visualized portions of the skeleton.  IMPRESSION: 1. No occult hemorrhage identified. 2. Small amount of intermediate attenuation pericardial fluid and/or thickening. Clinical correlation for signs symptoms of acute pericarditis is recommended. 3. Small to moderate left and passed trace right-sided pleural effusions layering dependently, with passive atelectasis in much of the left lower lobe. 4. Extensive colonic diverticulosis without findings to suggest acute diverticulitis at this time. 5. Normal appendix. 6. Numerous renal lesions bilaterally, favored to reflect a combination of renal cysts and renal pelvis cysts. 7. Additional incidental findings, as above.   Electronically Signed   By: Vinnie Langton M.D.   On: 07/31/2013 18:41   Dg Chest 2 View  07/28/2013   CLINICAL DATA:  Chest congestion.  Short of breath.  EXAM: CHEST  2 VIEW  COMPARISON:  07/22/2013  FINDINGS: Cardiac silhouette is normal in size and configuration. Normal mediastinal and hilar contours.  Clear lungs.  No pleural effusion.  No pneumothorax.  Bony thorax is intact.  IMPRESSION: No acute cardiopulmonary disease.   Electronically Signed   By: Lajean Manes M.D.   On: 07/28/2013 13:21   Dg Chest 2 View  07/15/2013   CLINICAL DATA:  Dyspnea and chest pain.  EXAM: CHEST  2 VIEW  COMPARISON:  07/13/2013 and 09/26/2012  FINDINGS: Lungs are adequately inflated with continued evidence of a small left pleural effusion with possible slight interval improvement. There is mild stable cardiomegaly. Remainder the exam is unchanged.  IMPRESSION:  Continued small left pleural effusion with possible slight interval improvement.   Electronically Signed   By: Marin Olp M.D.   On: 07/15/2013 11:32   Dg Chest 2 View  07/13/2013   CLINICAL DATA:  Weakness and chest pain.  EXAM: CHEST - 2 VIEW  COMPARISON:  DG CHEST 2 VIEW dated 09/26/2012; DG THORACIC SPINE dated 01/19/2011  FINDINGS: The heart is mildly enlarged. Mild interstitial and pulmonary vascular prominence identified without evidence of overt edema. There is a small left pleural effusion. Bony structures show stable mild degenerative disease of the thoracic spine.  IMPRESSION: Cardiomegaly and pulmonary interstitial prominence with small left pleural effusion. No overt edema is identified   Electronically Signed   By: Aletta Edouard M.D.   On: 07/13/2013 18:16   Ct Angio Chest Pe W/cm &/or Wo Cm  07/16/2013   CLINICAL DATA:  Shortness of breath, neck/back pain, elevated D-dimer, evaluate for PE. Lap band surgery in January.  EXAM: CT ANGIOGRAPHY CHEST WITH CONTRAST  TECHNIQUE: Multidetector CT imaging of the chest was performed using the standard protocol during bolus administration of intravenous contrast. Multiplanar CT image reconstructions and MIPs were obtained  to evaluate the vascular anatomy.  CONTRAST:  86mL OMNIPAQUE IOHEXOL 350 MG/ML SOLN  COMPARISON:  None.  FINDINGS: Tiny subsegmental filling defects within branches of the right lower lobe pulmonary artery (series 5/ images 169, 170, and 189), suspicious for pulmonary emboli. Overall clot burden is small. No findings to suggest right heart strain (RV-to-LV ratio 0.87).  Small to moderate left and small right pleural effusions. No frank interstitial edema. No pneumothorax.  Visualized right thyroid is notable for a suspected 2.2 cm thyroid nodule (series 4/ image 1), incompletely visualized.  The heart is top-normal in size. Small to moderate pericardial effusion with a mildly irregular appearance. Mild atherosclerotic calcifications  of the aortic arch.  No suspicious mediastinal, hilar, or axillary lymphadenopathy.  Visualized upper abdomen is notable for a laparoscopic band in satisfactory position.  Degenerative changes of the visualized thoracolumbar spine.  Review of the MIP images confirms the above findings.  IMPRESSION: Suspected tiny subsegmental pulmonary emboli within branches of the right lower lobe pulmonary artery. Overall clot burden is small.  Small to moderate left and small right pleural effusions. No frank interstitial edema.  Small to moderate pericardial effusion. Given the irregular appearance, correlate for pericarditis.  Critical Value/emergent results were called by telephone at the time of interpretation on 07/16/2013 at 5:28 PM to Dr. Gwyndolyn Saxon HOPPER, who verbally acknowledged these results.   Electronically Signed   By: Julian Hy M.D.   On: 07/16/2013 17:29   US Abdomen Limited  07/13/2013   CLINICAL DATA:  Increased liver function tests, right upper quadrant pain  EXAM: US ABDOMEN LIMITED - RIGHT UPPER QUADRANT  COMPARISON:  None.  FINDINGS: Gallbladder:  Surgically at  Common bile duct:  Diameter: 7 mm  Liver:  No focal abnormalities.  IMPRESSION: No acute abnormalities   Electronically Signed   By: Skipper Cliche M.D.   On: 07/13/2013 20:37   Dg Chest Port 1 View  07/22/2013   CLINICAL DATA:  Pericardial effusion.  Pericardial window.  EXAM: PORTABLE CHEST - 1 VIEW  COMPARISON:  DG CHEST 1V PORT dated 07/21/2013  FINDINGS: Right IJ line in stable position. Stable cardiomegaly. No pulmonary venous distention. Persistent mild left base subsegmental atelectasis remains. Mild elevation left hemidiaphragm. No prominent pleural effusion or pneumothorax noted. No acute osseous abnormality.  IMPRESSION: 1. Right IJ line in stable position. 2. Stable cardiomegaly. 3. Persistent left base subsegmental atelectasis.   Electronically Signed   By: Marcello Moores  Register   On: 07/22/2013 07:22   Dg Chest Port 1  View  07/21/2013   CLINICAL DATA:  Pericardial window.  EXAM: PORTABLE CHEST - 1 VIEW  COMPARISON:  DG CHEST PORT 1VSAME DAY dated 07/20/2013; CT ANGIO CHEST W/CM &/OR WO/CM dated 07/16/2013  FINDINGS: Mediastinum hilar structures are normal. Right IJ line in stable position. Stable cardiomegaly and pulmonary vascularity. Mild left base subsegmental atelectasis. No pneumothorax.  IMPRESSION: 1. Right IJ line in stable position. 2. Stable cardiomegaly, no CHF. 3. Mild left lung base atelectasis   Electronically Signed   By: [place]   On: 07/21/2013 10:30   Dg Chest Port 1 View  07/18/2013   CLINICAL DATA:  Cough.  Status post pericardial window procedure.  EXAM: PORTABLE CHEST - 1 VIEW  COMPARISON:  CT ANGIO CHEST W/CM &/OR WO/CM dated 07/16/2013; DG CHEST 2 VIEW dated 07/15/2013  FINDINGS: Right IJ line tip: Upper SVC. Moderately enlarged cardiopericardial silhouette. Indistinct pulmonary vasculature compatible with pulmonary venous hypertension.  Right costophrenic angle excluded. No  pneumothorax or significant pneumomediastinum observed. Retrocardiac density suggests mild atelectasis in the left lower lobe.  IMPRESSION: 1. Continued enlargement of the cardiopericardial silhouette, with underlying pulmonary venous hypertension. 2. Mild atelectasis in the left lower lobe. 3. Right IJ line tip:  Upper SVC.   Electronically Signed   By: Sherryl Barters M.D.   On: 07/18/2013 15:01   Dg Chest Port 1v Same Day  07/20/2013   CLINICAL DATA:  68 year old male status post pericardial window for effusion. Initial encounter.  EXAM: PORTABLE CHEST - 1 VIEW SAME DAY  COMPARISON:  07/18/2013 and earlier.  FINDINGS: Portable AP upright view at 0722 hr. Stable right IJ central line. Stable cardiac size and mediastinal contours. No pneumothorax or edema. Continued retrocardiac opacity but low the less veiling opacity at the lung bases.  IMPRESSION: 1. Stable cardiac silhouette since 07/18/2013. 2. Suspect regressed  small pleural effusions but continued lower lobe collapse or consolidation.   Electronically Signed   By: Lars Pinks M.D.   On: 07/20/2013 13:28   US Abdomen Limited Ruq  07/29/2013   CLINICAL DATA:  Elevated hepatic function studies  EXAM: US ABDOMEN LIMITED - RIGHT UPPER QUADRANT  COMPARISON:  US ABDOMEN LIMITED dated 07/13/2013; CT ANGIO CHEST W/CM &/OR WO/CM dated 07/16/2013; CT UROGRAM dated 11/28/2012  FINDINGS: Gallbladder:  The gallbladder is surgically absent.  Common bile duct:  Diameter: 5.5 mm.  Liver:  The hepatic echotexture is heterogeneous. No discrete mass is demonstrated. There is no intrahepatic ductal dilation.  IMPRESSION: Heterogeneous echotexture of the liver may reflect fatty infiltration. Given the elevated hepatic function studies, abdominal CT scanning or MRI may be useful next steps.   Electronically Signed   By: David  [place]   On: 07/29/2013 01:55   Assessment/Plan  Principal Problem:  Hypotension - resolved Active Problems:  PAF (paroxysmal atrial fibrillation) with RVR- episode last pm, - now in NSR Pulmonary thromboembolism--on Xarelto  Pericardial effusion s/p pericardial window 07/18/13 400cc- minimal residual effusion on echo 07/29/13  SLEEP APNEA with c pap  H/O laparoscopic adjustable gastric banding & hiatal hernia repair 04/08/13  Hepatitis  Chronic anticoagulation-Xarelto on hold - restart when ok with GI Gastric ulcer with hemorrhage 07/20/13--PPI BID- now with dark stools, check for hemoccult - GI eval in progress Leukocytosis  Anemia-some improvement was on Iron at home but H/H still low.  DOE with any exertion.  There is minimal residual effusion with normal RV and LV function. (No RV strain). SBP much improved.I agree with Dr. Haroldine Laws that he may have recurrent GIB. Doubt his symptoms are cardiac in nature. Would continue to hydrate and w/u for GIB. Can hold Xarelto for now. (if has recurrent GIB may need IVC filter). Back in NSR. Holding Xarelto for GIB.  Await final recs from GI as to whether it is ok to restart Xarelto.  Etiology of effusion felt to be related to viral pericarditis. Cochicine stopped due to elevated LFTs and a patient refused to take.  Needs Case Management consult for Xarelto - pt says that insurance will not pay for it.   Sueanne Margarita, MD  08/01/2013  10:40 AM

## 2013-08-01 NOTE — Progress Notes (Signed)
Patient converted to AFib @ 1732. Patient currently back in NSR. Roxan Hockey

## 2013-08-01 NOTE — Progress Notes (Addendum)
Chart reviewed.  [facility] PROGRESS NOTE  [name] ASN:[id number] DOB: [birth year] DOA: 07/28/2013 PCP: [name], MD  Assessment/Plan: Recent UGI bleed with continued heme positive stool Transfuse another unit prbc,Due to symptomatic anemia. start elequis. Insurance does not cover xarelto -Had upper GI bleed on recent hospitalization, undergoing EGD, procedure performed by Dr. Henrene Pastor which showed superficial gastric ulcers. Patient was discharged on Protonix 40 mg by mouth twice a day  PAF: this am, back in A fib, rvr. Rate around 130. Discussed with Dr. Radford Pax. Apparently, patient has refused a few doses of oral metoprolol. Will give IV metoprolol and his oral dose this morning as blood pressure tolerates. As long as rate can be controlled, okay to proceed with procedure.  Acute on chronic anemia:  Given a fib, RVR, symptomatic will give 1 unit PRBC. Pt agrees.  Hypotension Resolved.   History of pulmonary embolism. No bleeding. Change to elequis due to coverage  History of large pericardial effusion in setting of pericarditis -Repeat transthoracic echocardiogram today showed small circumferential pericardial effusion. Per Dr. Radford Pax, okay to stop colchicine.   Generalized weakness/fatigue See above. Will transfuse a unit of blood.  Elevated transaminases US shows only fatty liver.  Shock liver versus medication related. cochicine stopped. Fu in am and with hopper after discharge   Leukocytosis Resolving without abx  Chronic cough for months: robitussin hasn't helped. dayquil does sometimes. Has chronic postnasal drip and seasonal allergies. May be sinus related. Add flonase and claritin. Add prn tessalon. CXR negative, refer to [facility] as outpatient    Code Status: Full Code Family Communication: multiple at bedside 4/29 Disposition Plan: home when stable   Consultants:  Cardiology  GI   HPI/Subjective: Feels palpitations. No chest pain.  Feels fatigued still with dyspnea on exertion.   Objective: Filed Vitals:   08/01/13 1300  BP: 107/59  Pulse: 70  Temp: 99.2 F (37.3 C)  Resp: 19    Intake/Output Summary (Last 24 hours) at 08/01/13 1737 Last data filed at 08/01/13 1230  Gross per 24 hour  Intake    600 ml  Output   1675 ml  Net  -1075 ml   Filed Weights   07/29/13 0229  Weight: 134.1 kg (295 lb 10.2 oz)    telemetry: Atrial fibrillation, rate about 1:30.  Exam:   General:  appear somewhat anxious. Breathing nonlabored. Lying in bed. Still with frequent hacking cough.   HEENT: pale conjunctiva. No sinus tenderness  Cardiovascular: irregularly irregular with rapid rate   Respiratory: clear to auscultation bilaterally without wheezes rhonchi or rales   Abdomen: Soft, nontender nondistended  Ext: 1+ edema  Data Reviewed: Basic Metabolic Panel:  Recent Labs Lab 07/28/13 1130 07/29/13 0510 07/30/13 0500 07/31/13 0510  NA 135 139 140 140  K 3.9 3.7 4.2 3.9  CL 100 102 104 102  CO2 22 21 25 23   GLUCOSE 138* 120* 116* 119*  BUN 28* 27* 21 20  CREATININE 1.4 1.17 1.09 1.00  CALCIUM 8.0* 8.1* 8.0* 8.1*   Liver Function Tests:  Recent Labs Lab 07/28/13 1130 07/30/13 0500 07/31/13 0510  AST 359* 221* 223*  ALT 224* 223* 250*  ALKPHOS 382* 403* 443*  BILITOT 1.3* 1.0 1.1  PROT 6.3 5.6* 5.6*  ALBUMIN 2.7* 2.1* 2.1*   No results found for this basename: LIPASE, AMYLASE,  in the last 168 hours No results found for this basename: AMMONIA,  in the last 168 hours CBC:  Recent Labs Lab 07/28/13 1130 07/29/13  0510  07/30/13 0500 07/30/13 2018 07/31/13 0510 07/31/13 2100 08/01/13 0855  WBC 21.8 Repeated and verified X2.* 15.7*  --  11.7*  --  13.2* 11.8* 11.7*  NEUTROABS 18.9*  --   --   --   --   --   --   --   HGB 9.3* 8.7*  < > 7.7* 8.1* 7.6* 8.1* 8.8*  HCT 28.2* 26.7*  < > 24.4* 25.5* 23.6* 25.0* 28.0*  MCV 89.6 89.6  --  92.1  --  90.1 88.7 89.2  PLT 478.0* 402*  --  429*  --   453* 430* 386  < > = values in this interval not displayed. Cardiac Enzymes:  Recent Labs Lab 07/30/13 0500  CKTOTAL 21   BNP (last 3 results)  Recent Labs  07/13/13 1835  PROBNP 519.5*   CBG: No results found for this basename: GLUCAP,  in the last 168 hours  Recent Results (from the past 240 hour(s))  CULTURE, BLOOD (ROUTINE X 2)     Status: None   Collection Time    07/28/13 10:31 PM      Result Value Ref Range Status   Specimen Description BLOOD RIGHT ANTECUBITAL   Final   Special Requests     Final   Value: BOTTLES DRAWN AEROBIC AND ANAEROBIC 8CC BLUE 4CC RED   Culture  Setup Time     Final   Value: 07/29/2013 04:07     Performed at [facility]   Culture     Final   Value:        BLOOD CULTURE RECEIVED NO GROWTH TO DATE CULTURE WILL BE HELD FOR 5 DAYS BEFORE ISSUING A FINAL NEGATIVE REPORT     Performed at [facility]   Report Status PENDING   Incomplete  CULTURE, BLOOD (ROUTINE X 2)     Status: None   Collection Time    07/28/13 10:40 PM      Result Value Ref Range Status   Specimen Description BLOOD RIGHT FOREARM   Final   Special Requests BOTTLES DRAWN AEROBIC ONLY 4CC   Final   Culture  Setup Time     Final   Value: 07/29/2013 04:06     Performed at [facility]   Culture     Final   Value:        BLOOD CULTURE RECEIVED NO GROWTH TO DATE CULTURE WILL BE HELD FOR 5 DAYS BEFORE ISSUING A FINAL NEGATIVE REPORT     Performed at [facility]   Report Status PENDING   Incomplete  URINE CULTURE     Status: None   Collection Time    07/28/13 11:04 PM      Result Value Ref Range Status   Specimen Description URINE, CATHETERIZED   Final   Special Requests NONE   Final   Culture  Setup Time     Final   Value: 07/29/2013 00:07     Performed at [place]     Final   Value: NO GROWTH     Performed at [facility]   Culture     Final   Value: NO GROWTH     Performed at [facility]    Report Status 07/30/2013 FINAL   Final     Studies: Ct Abdomen Pelvis Wo Contrast  07/31/2013   CLINICAL DATA:  Decreasing hemoglobin on Xarelto.  EXAM: CT ABDOMEN AND PELVIS WITHOUT CONTRAST  TECHNIQUE: Multidetector CT imaging of the abdomen and pelvis was performed following the standard protocol without intravenous contrast.  COMPARISON:  CT of the abdomen and pelvis 11/28/2012.  FINDINGS: Lung Bases: Small to moderate left pleural effusion. Trace right pleural effusion. Small amount of intermediate attenuation pericardial fluid and/or thickening. No associated pericardial calcification in the visualized portions of the thorax. Probable passive atelectasis in much of the left lower lobe.  Abdomen/Pelvis: There is a LapBand in position adjacent to the gastroesophageal junction. The unenhanced appearance of the liver, pancreas, spleen and bilateral adrenal glands is unremarkable. Status post cholecystectomy. Numerous low-attenuation lesions in the kidneys bilaterally and in the renal pelvises bilaterally are incompletely characterized on today's non contrast CT examination, but are statistically likely to represent renal cysts and renal sinus cysts, the largest of which is in the right renal pelvis measuring 4.2 cm in diameter.  No high attenuation fluid within the peritoneal cavity or retroperitoneum to suggest significant hemorrhage. No significant volume of ascites. No pneumoperitoneum. No pathologic distention of small bowel. Extensive colonic diverticulosis, particularly of the sigmoid colon, without surrounding inflammatory changes to suggest acute diverticulitis at this time. No pathologically enlarged lymph nodes are noted in the abdomen or pelvis on today's non contrast CT examination. Atherosclerosis throughout the abdominal and pelvic vasculature, without definite aneurysm. Prostate gland and urinary bladder are unremarkable in appearance.  Musculoskeletal: There are no aggressive appearing lytic  or blastic lesions noted in the visualized portions of the skeleton.  IMPRESSION: 1. No occult hemorrhage identified. 2. Small amount of intermediate attenuation pericardial fluid and/or thickening. Clinical correlation for signs symptoms of acute pericarditis is recommended. 3. Small to moderate left and passed trace right-sided pleural effusions layering dependently, with passive atelectasis in much of the left lower lobe. 4. Extensive colonic diverticulosis without findings to suggest acute diverticulitis at this time. 5. Normal appendix. 6. Numerous renal lesions bilaterally, favored to reflect a combination of renal cysts and renal pelvis cysts. 7. Additional incidental findings, as above.   Electronically Signed   By: Vinnie Langton M.D.   On: 07/31/2013 18:41    Scheduled Meds: . benzonatate  200 mg Oral TID  . finasteride  5 mg Oral QHS  . fluticasone  2 spray Each Nare Daily  . furosemide  40 mg Intravenous Once  . loratadine  10 mg Oral Daily  . LORazepam  0.5-1 mg Oral BID  . metoprolol tartrate  12.5 mg Oral BID  . multivitamin  5 mL Oral Daily  . pantoprazole  40 mg Oral Q0600  . risperiDONE  0.5 mg Oral QHS  . rivaroxaban  20 mg Oral Q supper  . sertraline  150 mg Oral Q breakfast  . sodium chloride  3 mL Intravenous Q12H   Continuous Infusions:    Time spent: 35  min  Delfina Redwood, MD [facility] Pager [phone] 7PM-7AM, please contact night-coverage at [email or web address], password [facility] 08/01/2013, 5:37 PM  LOS: 4 days

## 2013-08-01 NOTE — Progress Notes (Signed)
Ambulated in hallway with patient. Patient did become slightly winded, and tired out quickly, but 02 SATS remained at 97% on room air. Patient ambulated 300 feet. [name]

## 2013-08-01 NOTE — Progress Notes (Addendum)
Physical Therapy Treatment Patient Details Name: [name] MRN: [id number] DOB: [birth year] Today's Date: 08/01/2013    History of Present Illness [name] is a 68 y.o. male who returns to the ED with progressively worsening fatigue since his discharge on 4/24.  He was recently hospitalized from 4/15 to 4/24 for pericarditis and had a pericardial window at that time.  Cultures of the pericardial fluid haven't grown anything out a this point.  He reports BP consistently in the 80s when checked at home.  Fatigue is worse with any activity, however he is feeling ok as long as he is lying down in bed.  During his hospital stay amlodipine was stopped and metoprolol was added to his med list.  No chest pain, no vomiting, no syncope, no diarrhea.  Seen by Dr. [name]  and was found to have significantly elevated WBC of 21k and elevated LFTs    PT Comments    Pt admitted with above. Pt currently with functional limitations due to balance and endurance deficits. Pt becomes winded when ambulating.   Will need to use RW at all times on d/c and pt states he has a RW.  Pt will benefit from skilled PT to increase their independence and safety with mobility to allow discharge to the venue listed below.   Follow Up Recommendations  Home health PT;Supervision/Assistance - 24 hour     Equipment Recommendations  None recommended by PT    Recommendations for Other Services       Precautions / Restrictions Precautions Precautions: Fall Restrictions Weight Bearing Restrictions: No    Mobility  Bed Mobility Overal bed mobility: Modified Independent             General bed mobility comments: Pt required no assist to transition to full seated position.  Transfers Overall transfer level: Needs assistance Equipment used: None Transfers: Sit to/from Stand Sit to Stand: Supervision         General transfer comment: Pt demonstrated proper hand placement and safety awareness with  transfers.Steadying assist needed upon initial standing.   Ambulation/Gait Ambulation/Gait assistance: Min guard Ambulation Distance (Feet): 300 Feet Assistive device: 1 person hand held assist Gait Pattern/deviations: Step-through pattern;Decreased stride length;Wide base of support Gait velocity: Somewhat decreased Gait velocity interpretation: Below normal speed for age/gender General Gait Details: mild unsteadiness with lightheadedness anddyspnea with little exertion.  Recommended RW for pt to use on d/c.  Pt in agreement as he realizes he is slightly unsteady with gait.     Stairs            Wheelchair Mobility    Modified Rankin (Stroke Patients Only)       Balance Overall balance assessment: Needs assistance;History of Falls         Standing balance support: No upper extremity supported;During functional activity Standing balance-Leahy Scale: Fair Standing balance comment: Pt can stand statically without UE support however cannot accept challenges to balance unless pt has RW or UE support.               High level balance activites: Direction changes;Turns;Sudden stops High Level Balance Comments: Needs min assist for high level balance activities without device.      Cognition Arousal/Alertness: Awake/alert Behavior During Therapy: WFL for tasks assessed/performed Overall Cognitive Status: Within Functional Limits for tasks assessed                      Exercises General Exercises - Lower Extremity Ankle Circles/Pumps:  AROM;Both;10 reps;Supine Long Arc Quad: AROM;Both;10 reps;Seated Hip Flexion/Marching: AROM;Both;10 reps;Seated    General Comments General comments (skin integrity, edema, etc.): Scored 18/24 on DGI suggesting need for RW.  As stated above, pt agrees to use his RW initially upon d/c .        Pertinent Vitals/Pain O2 sats >91 % with ambulation however DOE 3/4 with ambulation with pt needing standing rest breaks.  HR 88-105  bpm.  No pain.     Home Living                      Prior Function            PT Goals (current goals can now be found in the care plan section) Progress towards PT goals: Progressing toward goals    Frequency  Min 2X/week    PT Plan Discharge plan needs to be updated    Co-evaluation             End of Session Equipment Utilized During Treatment: Gait belt Activity Tolerance: Patient limited by fatigue Patient left: in chair;with call bell/phone within reach     Time: 0915-0947 PT Time Calculation (min): 32 min  Charges:  $Gait Training: 8-22 mins $Therapeutic Exercise: 8-22 mins                    G Codes:      Tea Collums Dorene Ar 08/04/2013, 12:07 PM [facility] [phone] (pager)

## 2013-08-02 DIAGNOSIS — Z7901 Long term (current) use of anticoagulants: Secondary | ICD-10-CM

## 2013-08-02 DIAGNOSIS — I509 Heart failure, unspecified: Secondary | ICD-10-CM

## 2013-08-02 LAB — TYPE AND SCREEN
ABO/RH(D): O POS
Antibody Screen: NEGATIVE
Unit division: 0
Unit division: 0

## 2013-08-02 LAB — CBC
HCT: 28.2 % — ABNORMAL LOW (ref 39.0–52.0)
HEMOGLOBIN: 9 g/dL — AB (ref 13.0–17.0)
MCH: 28.3 pg (ref 26.0–34.0)
MCHC: 31.9 g/dL (ref 30.0–36.0)
MCV: 88.7 fL (ref 78.0–100.0)
PLATELETS: 412 10*3/uL — AB (ref 150–400)
RBC: 3.18 MIL/uL — AB (ref 4.22–5.81)
RDW: 16.8 % — ABNORMAL HIGH (ref 11.5–15.5)
WBC: 10 10*3/uL (ref 4.0–10.5)

## 2013-08-02 LAB — COMPREHENSIVE METABOLIC PANEL
ALT: 130 U/L — ABNORMAL HIGH (ref 0–53)
AST: 56 U/L — ABNORMAL HIGH (ref 0–37)
Albumin: 2.1 g/dL — ABNORMAL LOW (ref 3.5–5.2)
Alkaline Phosphatase: 362 U/L — ABNORMAL HIGH (ref 39–117)
BUN: 15 mg/dL (ref 6–23)
CALCIUM: 8.1 mg/dL — AB (ref 8.4–10.5)
CHLORIDE: 103 meq/L (ref 96–112)
CO2: 27 mEq/L (ref 19–32)
CREATININE: 1.09 mg/dL (ref 0.50–1.35)
GFR calc non Af Amer: 68 mL/min — ABNORMAL LOW (ref >90)
GFR, EST AFRICAN AMERICAN: 79 mL/min — AB (ref >90)
Glucose, Bld: 97 mg/dL (ref 70–99)
POTASSIUM: 3.5 meq/L — AB (ref 3.7–5.3)
SODIUM: 143 meq/L (ref 137–147)
TOTAL PROTEIN: 5.6 g/dL — AB (ref 6.0–8.3)
Total Bilirubin: 0.6 mg/dL (ref 0.3–1.2)

## 2013-08-02 LAB — PRO B NATRIURETIC PEPTIDE: PRO B NATRI PEPTIDE: 2054 pg/mL — AB (ref 0–125)

## 2013-08-02 NOTE — Progress Notes (Signed)
Pt stated he would place himself on CPAP when he was ready. RT fill humidity chamber and hooked 2L oxygen bleed in to circuit. RT encouraged to call if he needed any assistance. RT will continue to monitor.

## 2013-08-02 NOTE — Progress Notes (Addendum)
SUBJECTIVE:  Feeling better today with less SOB  OBJECTIVE:   Vitals:   Filed Vitals:   08/02/13 0607 08/02/13 0610 08/02/13 0612 08/02/13 0613  BP: 103/66 94/70 97/55  116/72  Pulse: 73 81 78 80  Temp: 97.3 F (36.3 C)     TempSrc: Oral     Resp: 18     Height:      Weight:      SpO2: 96%      I&O's:   Intake/Output Summary (Last 24 hours) at 08/02/13 0647 Last data filed at 08/02/13 0093  Gross per 24 hour  Intake  972.5 ml  Output   3425 ml  Net -2452.5 ml   TELEMETRY: Reviewed telemetry pt in NSR:     PHYSICAL EXAM General: Well developed, well nourished, in no acute distress Head: Eyes PERRLA, No xanthomas.   Normal cephalic and atramatic  Lungs:   Clear bilaterally to auscultation and percussion. Heart:   HRRR S1 S2 Pulses are 2+ & equal. Abdomen: Bowel sounds are positive, abdomen soft and non-tender without masses  Extremities:   No clubbing, cyanosis or edema.  DP +1 Neuro: Alert and oriented X 3. Psych:  Good affect, responds appropriately   LABS: Basic Metabolic Panel:  Recent Labs  07/31/13 0510 08/02/13 0415  NA 140 143  K 3.9 3.5*  CL 102 103  CO2 23 27  GLUCOSE 119* 97  BUN 20 15  CREATININE 1.00 1.09  CALCIUM 8.1* 8.1*   Liver Function Tests:  Recent Labs  07/31/13 0510 08/02/13 0415  AST 223* 56*  ALT 250* 130*  ALKPHOS 443* 362*  BILITOT 1.1 0.6  PROT 5.6* 5.6*  ALBUMIN 2.1* 2.1*   No results found for this basename: LIPASE, AMYLASE,  in the last 72 hours CBC:  Recent Labs  08/01/13 0855 08/02/13 0415  WBC 11.7* 10.0  HGB 8.8* 9.0*  HCT 28.0* 28.2*  MCV 89.2 88.7  PLT 386 412*   Cardiac Enzymes: No results found for this basename: CKTOTAL, CKMB, CKMBINDEX, TROPONINI,  in the last 72 hours BNP: No components found with this basename: POCBNP,  D-Dimer: No results found for this basename: DDIMER,  in the last 72 hours Hemoglobin A1C: No results found for this basename: HGBA1C,  in the last 72 hours Fasting Lipid  Panel: No results found for this basename: CHOL, HDL, LDLCALC, TRIG, CHOLHDL, LDLDIRECT,  in the last 72 hours Thyroid Function Tests: No results found for this basename: TSH, T4TOTAL, FREET3, T3FREE, THYROIDAB,  in the last 72 hours Anemia Panel: No results found for this basename: VITAMINB12, FOLATE, FERRITIN, TIBC, IRON, RETICCTPCT,  in the last 72 hours Coag Panel:   No results found for this basename: INR, PROTIME    RADIOLOGY: Ct Abdomen Pelvis Wo Contrast  07/31/2013   CLINICAL DATA:  Decreasing hemoglobin on Xarelto.  EXAM: CT ABDOMEN AND PELVIS WITHOUT CONTRAST  TECHNIQUE: Multidetector CT imaging of the abdomen and pelvis was performed following the standard protocol without intravenous contrast.  COMPARISON:  CT of the abdomen and pelvis 11/28/2012.  FINDINGS: Lung Bases: Small to moderate left pleural effusion. Trace right pleural effusion. Small amount of intermediate attenuation pericardial fluid and/or thickening. No associated pericardial calcification in the visualized portions of the thorax. Probable passive atelectasis in much of the left lower lobe.  Abdomen/Pelvis: There is a LapBand in position adjacent to the gastroesophageal junction. The unenhanced appearance of the liver, pancreas, spleen and bilateral adrenal glands is unremarkable. Status post cholecystectomy. Numerous low-attenuation  lesions in the kidneys bilaterally and in the renal pelvises bilaterally are incompletely characterized on today's non contrast CT examination, but are statistically likely to represent renal cysts and renal sinus cysts, the largest of which is in the right renal pelvis measuring 4.2 cm in diameter.  No high attenuation fluid within the peritoneal cavity or retroperitoneum to suggest significant hemorrhage. No significant volume of ascites. No pneumoperitoneum. No pathologic distention of small bowel. Extensive colonic diverticulosis, particularly of the sigmoid colon, without surrounding  inflammatory changes to suggest acute diverticulitis at this time. No pathologically enlarged lymph nodes are noted in the abdomen or pelvis on today's non contrast CT examination. Atherosclerosis throughout the abdominal and pelvic vasculature, without definite aneurysm. Prostate gland and urinary bladder are unremarkable in appearance.  Musculoskeletal: There are no aggressive appearing lytic or blastic lesions noted in the visualized portions of the skeleton.  IMPRESSION: 1. No occult hemorrhage identified. 2. Small amount of intermediate attenuation pericardial fluid and/or thickening. Clinical correlation for signs symptoms of acute pericarditis is recommended. 3. Small to moderate left and passed trace right-sided pleural effusions layering dependently, with passive atelectasis in much of the left lower lobe. 4. Extensive colonic diverticulosis without findings to suggest acute diverticulitis at this time. 5. Normal appendix. 6. Numerous renal lesions bilaterally, favored to reflect a combination of renal cysts and renal pelvis cysts. 7. Additional incidental findings, as above.   Electronically Signed   By: Vinnie Langton M.D.   On: 07/31/2013 18:41   Dg Chest 2 View  07/28/2013   CLINICAL DATA:  Chest congestion.  Short of breath.  EXAM: CHEST  2 VIEW  COMPARISON:  07/22/2013  FINDINGS: Cardiac silhouette is normal in size and configuration. Normal mediastinal and hilar contours.  Clear lungs.  No pleural effusion.  No pneumothorax.  Bony thorax is intact.  IMPRESSION: No acute cardiopulmonary disease.   Electronically Signed   By: Lajean Manes M.D.   On: 07/28/2013 13:21   Dg Chest 2 View  07/15/2013   CLINICAL DATA:  Dyspnea and chest pain.  EXAM: CHEST  2 VIEW  COMPARISON:  07/13/2013 and 09/26/2012  FINDINGS: Lungs are adequately inflated with continued evidence of a small left pleural effusion with possible slight interval improvement. There is mild stable cardiomegaly. Remainder the exam is  unchanged.  IMPRESSION: Continued small left pleural effusion with possible slight interval improvement.   Electronically Signed   By: Marin Olp M.D.   On: 07/15/2013 11:32   Dg Chest 2 View  07/13/2013   CLINICAL DATA:  Weakness and chest pain.  EXAM: CHEST - 2 VIEW  COMPARISON:  DG CHEST 2 VIEW dated 09/26/2012; DG THORACIC SPINE dated 01/19/2011  FINDINGS: The heart is mildly enlarged. Mild interstitial and pulmonary vascular prominence identified without evidence of overt edema. There is a small left pleural effusion. Bony structures show stable mild degenerative disease of the thoracic spine.  IMPRESSION: Cardiomegaly and pulmonary interstitial prominence with small left pleural effusion. No overt edema is identified   Electronically Signed   By: Aletta Edouard M.D.   On: 07/13/2013 18:16   Ct Angio Chest Pe W/cm &/or Wo Cm  07/16/2013   CLINICAL DATA:  Shortness of breath, neck/back pain, elevated D-dimer, evaluate for PE. Lap band surgery in January.  EXAM: CT ANGIOGRAPHY CHEST WITH CONTRAST  TECHNIQUE: Multidetector CT imaging of the chest was performed using the standard protocol during bolus administration of intravenous contrast. Multiplanar CT image reconstructions and MIPs were obtained to  evaluate the vascular anatomy.  CONTRAST:  11mL OMNIPAQUE IOHEXOL 350 MG/ML SOLN  COMPARISON:  None.  FINDINGS: Tiny subsegmental filling defects within branches of the right lower lobe pulmonary artery (series 5/ images 169, 170, and 189), suspicious for pulmonary emboli. Overall clot burden is small. No findings to suggest right heart strain (RV-to-LV ratio 0.87).  Small to moderate left and small right pleural effusions. No frank interstitial edema. No pneumothorax.  Visualized right thyroid is notable for a suspected 2.2 cm thyroid nodule (series 4/ image 1), incompletely visualized.  The heart is top-normal in size. Small to moderate pericardial effusion with a mildly irregular appearance. Mild  atherosclerotic calcifications of the aortic arch.  No suspicious mediastinal, hilar, or axillary lymphadenopathy.  Visualized upper abdomen is notable for a laparoscopic band in satisfactory position.  Degenerative changes of the visualized thoracolumbar spine.  Review of the MIP images confirms the above findings.  IMPRESSION: Suspected tiny subsegmental pulmonary emboli within branches of the right lower lobe pulmonary artery. Overall clot burden is small.  Small to moderate left and small right pleural effusions. No frank interstitial edema.  Small to moderate pericardial effusion. Given the irregular appearance, correlate for pericarditis.  Critical Value/emergent results were called by telephone at the time of interpretation on 07/16/2013 at 5:28 PM to Dr. Gwyndolyn Saxon HOPPER, who verbally acknowledged these results.   Electronically Signed   By: Julian Hy M.D.   On: 07/16/2013 17:29   US Abdomen Limited  07/13/2013   CLINICAL DATA:  Increased liver function tests, right upper quadrant pain  EXAM: US ABDOMEN LIMITED - RIGHT UPPER QUADRANT  COMPARISON:  None.  FINDINGS: Gallbladder:  Surgically at  Common bile duct:  Diameter: 7 mm  Liver:  No focal abnormalities.  IMPRESSION: No acute abnormalities   Electronically Signed   By: Skipper Cliche M.D.   On: 07/13/2013 20:37   Dg Chest Port 1 View  07/22/2013   CLINICAL DATA:  Pericardial effusion.  Pericardial window.  EXAM: PORTABLE CHEST - 1 VIEW  COMPARISON:  DG CHEST 1V PORT dated 07/21/2013  FINDINGS: Right IJ line in stable position. Stable cardiomegaly. No pulmonary venous distention. Persistent mild left base subsegmental atelectasis remains. Mild elevation left hemidiaphragm. No prominent pleural effusion or pneumothorax noted. No acute osseous abnormality.  IMPRESSION: 1. Right IJ line in stable position. 2. Stable cardiomegaly. 3. Persistent left base subsegmental atelectasis.   Electronically Signed   By: Marcello Moores  Register   On: 07/22/2013 07:22    Dg Chest Port 1 View  07/21/2013   CLINICAL DATA:  Pericardial window.  EXAM: PORTABLE CHEST - 1 VIEW  COMPARISON:  DG CHEST PORT 1VSAME DAY dated 07/20/2013; CT ANGIO CHEST W/CM &/OR WO/CM dated 07/16/2013  FINDINGS: Mediastinum hilar structures are normal. Right IJ line in stable position. Stable cardiomegaly and pulmonary vascularity. Mild left base subsegmental atelectasis. No pneumothorax.  IMPRESSION: 1. Right IJ line in stable position. 2. Stable cardiomegaly, no CHF. 3. Mild left lung base atelectasis   Electronically Signed   By: [place]   On: 07/21/2013 10:30   Dg Chest Port 1 View  07/18/2013   CLINICAL DATA:  Cough.  Status post pericardial window procedure.  EXAM: PORTABLE CHEST - 1 VIEW  COMPARISON:  CT ANGIO CHEST W/CM &/OR WO/CM dated 07/16/2013; DG CHEST 2 VIEW dated 07/15/2013  FINDINGS: Right IJ line tip: Upper SVC. Moderately enlarged cardiopericardial silhouette. Indistinct pulmonary vasculature compatible with pulmonary venous hypertension.  Right costophrenic angle excluded. No pneumothorax  or significant pneumomediastinum observed. Retrocardiac density suggests mild atelectasis in the left lower lobe.  IMPRESSION: 1. Continued enlargement of the cardiopericardial silhouette, with underlying pulmonary venous hypertension. 2. Mild atelectasis in the left lower lobe. 3. Right IJ line tip:  Upper SVC.   Electronically Signed   By: Sherryl Barters M.D.   On: 07/18/2013 15:01   Dg Chest Port 1v Same Day  07/20/2013   CLINICAL DATA:  68 year old male status post pericardial window for effusion. Initial encounter.  EXAM: PORTABLE CHEST - 1 VIEW SAME DAY  COMPARISON:  07/18/2013 and earlier.  FINDINGS: Portable AP upright view at 0722 hr. Stable right IJ central line. Stable cardiac size and mediastinal contours. No pneumothorax or edema. Continued retrocardiac opacity but low the less veiling opacity at the lung bases.  IMPRESSION: 1. Stable cardiac silhouette since 07/18/2013. 2.  Suspect regressed small pleural effusions but continued lower lobe collapse or consolidation.   Electronically Signed   By: Lars Pinks M.D.   On: 07/20/2013 13:28   US Abdomen Limited Ruq  07/29/2013   CLINICAL DATA:  Elevated hepatic function studies  EXAM: US ABDOMEN LIMITED - RIGHT UPPER QUADRANT  COMPARISON:  US ABDOMEN LIMITED dated 07/13/2013; CT ANGIO CHEST W/CM &/OR WO/CM dated 07/16/2013; CT UROGRAM dated 11/28/2012  FINDINGS: Gallbladder:  The gallbladder is surgically absent.  Common bile duct:  Diameter: 5.5 mm.  Liver:  The hepatic echotexture is heterogeneous. No discrete mass is demonstrated. There is no intrahepatic ductal dilation.  IMPRESSION: Heterogeneous echotexture of the liver may reflect fatty infiltration. Given the elevated hepatic function studies, abdominal CT scanning or MRI may be useful next steps.   Electronically Signed   By: David  Martinique   On: 07/29/2013 01:55   Assessment/Plan  Principal Problem:  Hypotension - improved Active Problems:  PAF (paroxysmal atrial fibrillation) with RVR- multiple episodes of recurring PAF in hospital.  Borderline low BP limits titration of beta blockers. Elevated LFTs precludes use of Amio.  Hopefully once anemia resolves he will maintain NSR Pulmonary thromboembolism--now back on anticoagulation - Eliquis started since patient cannot afford Xarelto Pericardial effusion s/p pericardial window 07/18/13 400cc- minimal residual effusion on echo 07/29/13  SLEEP APNEA with c pap  H/O laparoscopic adjustable gastric banding & hiatal hernia repair 04/08/13  Hepatitis  Chronic anticoagulation-now on Eliquis Gastric ulcer with hemorrhage 07/20/13--PPI BID Leukocytosis  Anemia-some improvement  DOE with any exertion.  Acute on chronic diastolic CHF - BNP elevated yesterday.  Received IV Lasix yesterday with blood.  Lungs are clear and no LE edema.  Continue PO Lasix   Sueanne Margarita, MD  08/02/2013  6:47 AM

## 2013-08-02 NOTE — Progress Notes (Signed)
Chart reviewed.  [facility] PROGRESS NOTE  [name] AYT:[id number] DOB: 06-Sep-1945 DOA: 07/28/2013 PCP: [name], MD  Assessment/Plan: Recent UGI bleed with continued heme positive stool Transfuse another unit prbc,Due to symptomatic anemia. start elequis. Insurance does not cover xarelto -Had upper GI bleed on recent hospitalization, undergoing EGD, procedure performed by Dr. Henrene Pastor which showed superficial gastric ulcers. Patient was discharged on Protonix 40 mg by mouth twice a day -Hg stable at 9.0, will continue to monitor on anticoagulation  PAF: this am, back in A fib, rvr. Rate around 130. Discussed with Dr. Radford Pax. Apparently, patient has refused a few doses of oral metoprolol. Will give IV metoprolol and his oral dose this morning as blood pressure tolerates. As long as rate can be controlled, okay to proceed with procedure. -On low dose beta blocker 12.5 mg PO BID, presently rate controlled -Apixaban started at 5 mg BID  Acute on chronic anemia: -S/P transfusion with PRBC's  Hypotension Resolved.   History of pulmonary embolism. -Now on apixaban 5 mg PO BID, monitor for bleeding complications  History of large pericardial effusion in setting of pericarditis -Repeat transthoracic echocardiogram today showed small circumferential pericardial effusion. Per Dr. Radford Pax, okay to stop colchicine.   Generalized weakness/fatigue See above. Will transfuse a unit of blood.  Elevated transaminases US shows only fatty liver.  Shock liver versus medication related. cochicine stopped. Fu in am and with hopper after discharge   Leukocytosis Resolving without abx  Chronic cough for months: robitussin hasn't helped. dayquil does sometimes. Has chronic postnasal drip and seasonal allergies. May be sinus related. Add flonase and claritin. Add prn tessalon. CXR negative, refer to [facility] as [facility]    Code Status: Full Code Family Communication: multiple at  bedside 4/29 Disposition Plan: home when stable   Consultants:  Cardiology  GI   HPI/Subjective: Feels palpitations. No chest pain. Feels fatigued still with dyspnea on exertion.   Objective: Filed Vitals:   08/02/13 1055  BP: 120/64  Pulse: 64  Temp:   Resp:     Intake/Output Summary (Last 24 hours) at 08/02/13 1125 Last data filed at 08/02/13 0730  Gross per 24 hour  Intake  972.5 ml  Output   3275 ml  Net -2302.5 ml   Filed Weights   07/29/13 0229  Weight: 134.1 kg (295 lb 10.2 oz)    telemetry: Atrial fibrillation, rate about 1:30.  Exam:   General:  appear somewhat anxious. Breathing nonlabored. Lying in bed. Still with frequent hacking cough.   HEENT: pale conjunctiva. No sinus tenderness  Cardiovascular: irregularly irregular with rapid rate   Respiratory: clear to auscultation bilaterally without wheezes rhonchi or rales   Abdomen: Soft, nontender nondistended  Ext: 1+ edema  Data Reviewed: Basic Metabolic Panel:  Recent Labs Lab 07/28/13 1130 07/29/13 0510 07/30/13 0500 07/31/13 0510 08/02/13 0415  NA 135 139 140 140 143  K 3.9 3.7 4.2 3.9 3.5*  CL 100 102 104 102 103  CO2 22 21 25 23 27   GLUCOSE 138* 120* 116* 119* 97  BUN 28* 27* 21 20 15   CREATININE 1.4 1.17 1.09 1.00 1.09  CALCIUM 8.0* 8.1* 8.0* 8.1* 8.1*   Liver Function Tests:  Recent Labs Lab 07/28/13 1130 07/30/13 0500 07/31/13 0510 08/02/13 0415  AST 359* 221* 223* 56*  ALT 224* 223* 250* 130*  ALKPHOS 382* 403* 443* 362*  BILITOT 1.3* 1.0 1.1 0.6  PROT 6.3 5.6* 5.6* 5.6*  ALBUMIN 2.7* 2.1* 2.1* 2.1*   No  results found for this basename: LIPASE, AMYLASE,  in the last 168 hours No results found for this basename: AMMONIA,  in the last 168 hours CBC:  Recent Labs Lab 07/28/13 1130  07/30/13 0500 07/30/13 2018 07/31/13 0510 07/31/13 2100 08/01/13 0855 08/02/13 0415  WBC 21.8 Repeated and verified X2.*  < > 11.7*  --  13.2* 11.8* 11.7* 10.0  NEUTROABS  18.9*  --   --   --   --   --   --   --   HGB 9.3*  < > 7.7* 8.1* 7.6* 8.1* 8.8* 9.0*  HCT 28.2*  < > 24.4* 25.5* 23.6* 25.0* 28.0* 28.2*  MCV 89.6  < > 92.1  --  90.1 88.7 89.2 88.7  PLT 478.0*  < > 429*  --  453* 430* 386 412*  < > = values in this interval not displayed. Cardiac Enzymes:  Recent Labs Lab 07/30/13 0500  CKTOTAL 21   BNP (last 3 results)  Recent Labs  07/13/13 1835 08/02/13 0415  PROBNP 519.5* 2054.0*   CBG: No results found for this basename: GLUCAP,  in the last 168 hours  Recent Results (from the past 240 hour(s))  CULTURE, BLOOD (ROUTINE X 2)     Status: None   Collection Time    07/28/13 10:31 PM      Result Value Ref Range Status   Specimen Description BLOOD RIGHT ANTECUBITAL   Final   Special Requests     Final   Value: BOTTLES DRAWN AEROBIC AND ANAEROBIC 8CC BLUE 4CC RED   Culture  Setup Time     Final   Value: 07/29/2013 04:07     Performed at [facility]   Culture     Final   Value:        BLOOD CULTURE RECEIVED NO GROWTH TO DATE CULTURE WILL BE HELD FOR 5 DAYS BEFORE ISSUING A FINAL NEGATIVE REPORT     Performed at [facility]   Report Status PENDING   Incomplete  CULTURE, BLOOD (ROUTINE X 2)     Status: None   Collection Time    07/28/13 10:40 PM      Result Value Ref Range Status   Specimen Description BLOOD RIGHT FOREARM   Final   Special Requests BOTTLES DRAWN AEROBIC ONLY 4CC   Final   Culture  Setup Time     Final   Value: 07/29/2013 04:06     Performed at [facility]   Culture     Final   Value:        BLOOD CULTURE RECEIVED NO GROWTH TO DATE CULTURE WILL BE HELD FOR 5 DAYS BEFORE ISSUING A FINAL NEGATIVE REPORT     Performed at [facility]   Report Status PENDING   Incomplete  URINE CULTURE     Status: None   Collection Time    07/28/13 11:04 PM      Result Value Ref Range Status   Specimen Description URINE, CATHETERIZED   Final   Special Requests NONE   Final   Culture  Setup Time      Final   Value: 07/29/2013 00:07     Performed at [facility]     Final   Value: NO GROWTH     Performed at [facility]   Culture     Final   Value: NO GROWTH     Performed at [facility]  Report Status 07/30/2013 FINAL   Final     Studies: Ct Abdomen Pelvis Wo Contrast  07/31/2013   CLINICAL DATA:  Decreasing hemoglobin on Xarelto.  EXAM: CT ABDOMEN AND PELVIS WITHOUT CONTRAST  TECHNIQUE: Multidetector CT imaging of the abdomen and pelvis was performed following the standard protocol without intravenous contrast.  COMPARISON:  CT of the abdomen and pelvis 11/28/2012.  FINDINGS: Lung Bases: Small to moderate left pleural effusion. Trace right pleural effusion. Small amount of intermediate attenuation pericardial fluid and/or thickening. No associated pericardial calcification in the visualized portions of the thorax. Probable passive atelectasis in much of the left lower lobe.  Abdomen/Pelvis: There is a LapBand in position adjacent to the gastroesophageal junction. The unenhanced appearance of the liver, pancreas, spleen and bilateral adrenal glands is unremarkable. Status post cholecystectomy. Numerous low-attenuation lesions in the kidneys bilaterally and in the renal pelvises bilaterally are incompletely characterized on today's non contrast CT examination, but are statistically likely to represent renal cysts and renal sinus cysts, the largest of which is in the right renal pelvis measuring 4.2 cm in diameter.  No high attenuation fluid within the peritoneal cavity or retroperitoneum to suggest significant hemorrhage. No significant volume of ascites. No pneumoperitoneum. No pathologic distention of small bowel. Extensive colonic diverticulosis, particularly of the sigmoid colon, without surrounding inflammatory changes to suggest acute diverticulitis at this time. No pathologically enlarged lymph nodes are noted in the abdomen or pelvis on today's non  contrast CT examination. Atherosclerosis throughout the abdominal and pelvic vasculature, without definite aneurysm. Prostate gland and urinary bladder are unremarkable in appearance.  Musculoskeletal: There are no aggressive appearing lytic or blastic lesions noted in the visualized portions of the skeleton.  IMPRESSION: 1. No occult hemorrhage identified. 2. Small amount of intermediate attenuation pericardial fluid and/or thickening. Clinical correlation for signs symptoms of acute pericarditis is recommended. 3. Small to moderate left and passed trace right-sided pleural effusions layering dependently, with passive atelectasis in much of the left lower lobe. 4. Extensive colonic diverticulosis without findings to suggest acute diverticulitis at this time. 5. Normal appendix. 6. Numerous renal lesions bilaterally, favored to reflect a combination of renal cysts and renal pelvis cysts. 7. Additional incidental findings, as above.   Electronically Signed   By: Vinnie Langton M.D.   On: 07/31/2013 18:41    Scheduled Meds: . apixaban  5 mg Oral BID  . benzonatate  200 mg Oral TID  . finasteride  5 mg Oral QHS  . fluticasone  2 spray Each Nare Daily  . furosemide  40 mg Intravenous Once  . furosemide  20 mg Oral Daily  . loratadine  10 mg Oral Daily  . LORazepam  0.5-1 mg Oral BID  . metoprolol  5 mg Intravenous Once  . metoprolol tartrate  12.5 mg Oral BID  . multivitamin  5 mL Oral Daily  . pantoprazole  40 mg Oral BID  . risperiDONE  0.5 mg Oral QHS  . sertraline  150 mg Oral Q breakfast  . sodium chloride  3 mL Intravenous Q12H   Continuous Infusions:    Time spent: 35  min  Kelvin Cellar, MD [facility] Pager [phone] 7PM-7AM, please contact night-coverage at [email or web address], password [facility] 08/02/2013, 11:25 AM  LOS: 5 days

## 2013-08-03 ENCOUNTER — Inpatient Hospital Stay (HOSPITAL_COMMUNITY): Payer: Medicare Other

## 2013-08-03 LAB — COMPREHENSIVE METABOLIC PANEL
ALBUMIN: 2.3 g/dL — AB (ref 3.5–5.2)
ALK PHOS: 368 U/L — AB (ref 39–117)
ALT: 113 U/L — AB (ref 0–53)
AST: 56 U/L — ABNORMAL HIGH (ref 0–37)
BILIRUBIN TOTAL: 0.5 mg/dL (ref 0.3–1.2)
BUN: 15 mg/dL (ref 6–23)
CHLORIDE: 104 meq/L (ref 96–112)
CO2: 25 meq/L (ref 19–32)
Calcium: 8.5 mg/dL (ref 8.4–10.5)
Creatinine, Ser: 0.98 mg/dL (ref 0.50–1.35)
GFR calc Af Amer: >90 mL/min (ref >90)
GFR calc non Af Amer: 83 mL/min — ABNORMAL LOW (ref >90)
Glucose, Bld: 122 mg/dL — ABNORMAL HIGH (ref 70–99)
POTASSIUM: 3.6 meq/L — AB (ref 3.7–5.3)
SODIUM: 145 meq/L (ref 137–147)
Total Protein: 6.3 g/dL (ref 6.0–8.3)

## 2013-08-03 LAB — PRO B NATRIURETIC PEPTIDE: Pro B Natriuretic peptide (BNP): 977.7 pg/mL — ABNORMAL HIGH (ref 0–125)

## 2013-08-03 LAB — CBC
HEMATOCRIT: 32.8 % — AB (ref 39.0–52.0)
Hemoglobin: 10.4 g/dL — ABNORMAL LOW (ref 13.0–17.0)
MCH: 28.2 pg (ref 26.0–34.0)
MCHC: 31.7 g/dL (ref 30.0–36.0)
MCV: 88.9 fL (ref 78.0–100.0)
Platelets: 430 10*3/uL — ABNORMAL HIGH (ref 150–400)
RBC: 3.69 MIL/uL — AB (ref 4.22–5.81)
RDW: 16.5 % — ABNORMAL HIGH (ref 11.5–15.5)
WBC: 9.2 10*3/uL (ref 4.0–10.5)

## 2013-08-03 LAB — MAGNESIUM: Magnesium: 2.2 mg/dL (ref 1.5–2.5)

## 2013-08-03 MED ORDER — POTASSIUM CHLORIDE CRYS ER 10 MEQ PO TBCR
10.0000 meq | EXTENDED_RELEASE_TABLET | Freq: Three times a day (TID) | ORAL | Status: DC
  Administered 2013-08-03 – 2013-08-04 (×4): 10 meq via ORAL
  Filled 2013-08-03 (×6): qty 1

## 2013-08-03 MED ORDER — TECHNETIUM TC 99M SESTAMIBI GENERIC - CARDIOLITE
30.0000 | Freq: Once | INTRAVENOUS | Status: AC | PRN
  Administered 2013-08-03: 30 via INTRAVENOUS

## 2013-08-03 MED ORDER — FLECAINIDE ACETATE 50 MG PO TABS
50.0000 mg | ORAL_TABLET | Freq: Two times a day (BID) | ORAL | Status: DC
  Administered 2013-08-03 – 2013-08-04 (×3): 50 mg via ORAL
  Filled 2013-08-03 (×4): qty 1

## 2013-08-03 NOTE — Progress Notes (Addendum)
Chart reviewed.  [facility] PROGRESS NOTE  [name] HWE:[id number] DOB: Apr 05, 1945 DOA: 07/28/2013 PCP: [name], MD  Summary  68 y.o. male who returns to the ED with progressively worsening fatigue since his discharge on 4/24. He was recently hospitalized from 4/15 to 4/24 for pericarditis and had a pericardial window at that time. Cultures of the pericardial fluid haven't grown anything out a this point. He reports BP consistently in the 80s when checked at home. Fatigue is worse with any activity, however he is feeling ok as long as he is lying down in bed. During his hospital stay amlodipine was stopped and metoprolol was added to his med list. No chest pain, no vomiting, no syncope, no diarrhea. Seen by Dr. [name] earlier today and was found to have significantly elevated WBC of 21k and elevated LFTs.  Assessment/Plan: Recent UGI bleed with continued heme positive stool Status post 2 units of packed red blood cells. Hemoglobin rose appropriately and is at 10 today. Patient feeling much stronger after transfusion. -Had upper GI bleed on recent hospitalization, EGD,  showed superficial gastric ulcers. Patient was discharged on Protonix 40 mg by mouth twice a day. Repeat EGD showed healing of ulcers, no bleed. Colonoscopy showed polyps no bleeding. CT of the abdomen and pelvis showed no occult intra-abdominal or retroperitoneal bleed.  PAF:  Patient has been in and out of atrial fibrillation with rapid ventricular response. Blood pressure has limited the ability to increase metoprolol much. Cardiology has started Flecanide. For 2 day stress test tomorrow. Potassium low. Will replete  Acute on chronic anemia:  Improved.   Hypotension Resolved.   History of pulmonary embolism. Changed xarelto to eliquis due to coverage  History of large pericardial effusion in setting of pericarditis -Repeat transthoracic echocardiogram showed small circumferential pericardial  effusion. Per Dr. Radford Pax, okay to stop colchicine, as may have contributed to increased LFTs.   Elevated proBNP: Lasix had been held for several days but was resumed several days ago. Lungs are clear but will check chest x-ray to rule out pulmonary edema  Generalized weakness/fatigue Much better with higher hemoglobin. Patient walked to the nursing station and back twice yesterday.  Elevated transaminases US shows only fatty liver.  Shock liver versus medication related. cochicine stopped. Fu with Dr. [name] after discharge. Slowly improving.  Leukocytosis Resolved without abx  Chronic cough for months: Chest x-ray negative. May be related to chronic sinusitis/allergic rhinitis/postnasal drip. Continue Flonase. Also may be reflux related and has maxed out PPI. Will need followup with pulmonary medicine as an outpatient for further workup if continues. Better on current regimen.  History of lap band procedure: Patient reports he is not on a bariatric diet. Will consult dietitian.    Code Status: Full Code Family Communication: multiple at bedside 4/29 Disposition Plan: home when stable   Consultants:  Cardiology  GI   HPI/Subjective: Feels much less short of breath and decrease in fatigue. No palpitations or chest pain. No bleeding.  Objective: Filed Vitals:   08/03/13 0430  BP: 132/78  Pulse: 77  Temp: 97.7 F (36.5 C)  Resp: 18    Intake/Output Summary (Last 24 hours) at 08/03/13 1059 Last data filed at 08/03/13 0735  Gross per 24 hour  Intake   1200 ml  Output   1350 ml  Net   -150 ml   Filed Weights   07/29/13 0229  Weight: 134.1 kg (295 lb 10.2 oz)    telemetry: Normal sinus rhythm  Exam:  General:  Comfortable and talkative  HEENT: Conjunctiva less pale  Cardiovascular: Regular rate rhythm without murmurs gallops rubs  Respiratory: clear to auscultation bilaterally without wheezes rhonchi or rales   Abdomen: Soft, nontender  nondistended  Ext: 1+ edema  Data Reviewed: Basic Metabolic Panel:  Recent Labs Lab 07/29/13 0510 07/30/13 0500 07/31/13 0510 08/02/13 0415 08/03/13 0815  NA 139 140 140 143 145  K 3.7 4.2 3.9 3.5* 3.6*  CL 102 104 102 103 104  CO2 21 25 23 27 25   GLUCOSE 120* 116* 119* 97 122*  BUN 27* 21 20 15 15   CREATININE 1.17 1.09 1.00 1.09 0.98  CALCIUM 8.1* 8.0* 8.1* 8.1* 8.5   Liver Function Tests:  Recent Labs Lab 07/28/13 1130 07/30/13 0500 07/31/13 0510 08/02/13 0415 08/03/13 0815  AST 359* 221* 223* 56* 56*  ALT 224* 223* 250* 130* 113*  ALKPHOS 382* 403* 443* 362* 368*  BILITOT 1.3* 1.0 1.1 0.6 0.5  PROT 6.3 5.6* 5.6* 5.6* 6.3  ALBUMIN 2.7* 2.1* 2.1* 2.1* 2.3*   No results found for this basename: LIPASE, AMYLASE,  in the last 168 hours No results found for this basename: AMMONIA,  in the last 168 hours CBC:  Recent Labs Lab 07/28/13 1130  07/31/13 0510 07/31/13 2100 08/01/13 0855 08/02/13 0415 08/03/13 0815  WBC 21.8 Repeated and verified X2.*  < > 13.2* 11.8* 11.7* 10.0 9.2  NEUTROABS 18.9*  --   --   --   --   --   --   HGB 9.3*  < > 7.6* 8.1* 8.8* 9.0* 10.4*  HCT 28.2*  < > 23.6* 25.0* 28.0* 28.2* 32.8*  MCV 89.6  < > 90.1 88.7 89.2 88.7 88.9  PLT 478.0*  < > 453* 430* 386 412* 430*  < > = values in this interval not displayed. Cardiac Enzymes:  Recent Labs Lab 07/30/13 0500  CKTOTAL 21   BNP (last 3 results)  Recent Labs  07/13/13 1835 08/02/13 0415 08/03/13 0815  PROBNP 519.5* 2054.0* 977.7*   CBG: No results found for this basename: GLUCAP,  in the last 168 hours  Recent Results (from the past 240 hour(s))  CULTURE, BLOOD (ROUTINE X 2)     Status: None   Collection Time    07/28/13 10:31 PM      Result Value Ref Range Status   Specimen Description BLOOD RIGHT ANTECUBITAL   Final   Special Requests     Final   Value: BOTTLES DRAWN AEROBIC AND ANAEROBIC 8CC BLUE 4CC RED   Culture  Setup Time     Final   Value: 07/29/2013 04:07      Performed at [facility]   Culture     Final   Value: GRAM POSITIVE RODS     Note: Gram Stain Report Called to,Read Back By and Verified With: MICHELLE MOORE 08/02/13 @ 5:26PM BY RUSCOE A.     Performed at [facility]   Report Status PENDING   Incomplete  CULTURE, BLOOD (ROUTINE X 2)     Status: None   Collection Time    07/28/13 10:40 PM      Result Value Ref Range Status   Specimen Description BLOOD RIGHT FOREARM   Final   Special Requests BOTTLES DRAWN AEROBIC ONLY 4CC   Final   Culture  Setup Time     Final   Value: 07/29/2013 04:06     Performed at [facility]  Final   Value:        BLOOD CULTURE RECEIVED NO GROWTH TO DATE CULTURE WILL BE HELD FOR 5 DAYS BEFORE ISSUING A FINAL NEGATIVE REPORT     Performed at [facility]   Report Status PENDING   Incomplete  URINE CULTURE     Status: None   Collection Time    07/28/13 11:04 PM      Result Value Ref Range Status   Specimen Description URINE, CATHETERIZED   Final   Special Requests NONE   Final   Culture  Setup Time     Final   Value: 07/29/2013 00:07     Performed at [facility]     Final   Value: NO GROWTH     Performed at [facility]   Culture     Final   Value: NO GROWTH     Performed at [facility]   Report Status 07/30/2013 FINAL   Final     Studies: No results found.  Scheduled Meds: . apixaban  5 mg Oral BID  . benzonatate  200 mg Oral TID  . finasteride  5 mg Oral QHS  . flecainide  50 mg Oral Q12H  . fluticasone  2 spray Each Nare Daily  . furosemide  40 mg Intravenous Once  . furosemide  20 mg Oral Daily  . loratadine  10 mg Oral Daily  . LORazepam  0.5-1 mg Oral BID  . metoprolol  5 mg Intravenous Once  . metoprolol tartrate  12.5 mg Oral BID  . multivitamin  5 mL Oral Daily  . pantoprazole  40 mg Oral BID  . potassium chloride  10 mEq Oral TID  . risperiDONE  0.5 mg Oral QHS  . sertraline  150 mg Oral Q  breakfast  . sodium chloride  3 mL Intravenous Q12H   Continuous Infusions:    Time spent: 35  min  Delfina Redwood, MD [facility] Pager [phone] 7PM-7AM, please contact night-coverage at [email or web address], password [facility] 08/03/2013, 10:59 AM  LOS: 6 days

## 2013-08-03 NOTE — Discharge Instructions (Signed)
Information on my medicine - ELIQUIS (apixaban)  This medication education was reviewed with me or my healthcare representative as part of my discharge preparation.  The pharmacist that spoke with me during my hospital stay was: Why was Eliquis prescribed for you? Eliquis was prescribed for you to reduce the risk of a blood clot forming that can cause a stroke if you have a medical condition called atrial fibrillation (a type of irregular heartbeat).  What do You need to know about Eliquis ? Take your Eliquis 5mg  TWICE DAILY - one tablet in the morning and one tablet in the evening with or without food. If you have difficulty swallowing the tablet whole please discuss with your pharmacist how to take the medication safely.  Take Eliquis exactly as prescribed by your doctor and DO NOT stop taking Eliquis without talking to the doctor who prescribed the medication.  Stopping may increase your risk of developing a stroke.  Refill your prescription before you run out.  After discharge, you should have regular check-up appointments with your healthcare provider that is prescribing your Eliquis.  In the future your dose may need to be changed if your kidney function or weight changes by a significant amount or as you get older.  What do you do if you miss a dose? If you miss a dose, take it as soon as you remember on the same day and resume taking twice daily.  Do not take more than one dose of ELIQUIS at the same time to make up a missed dose.  Important Safety Information A possible side effect of Eliquis is bleeding. You should call your healthcare provider right away if you experience any of the following:   Bleeding from an injury or your nose that does not stop.   Unusual colored urine (red or dark brown) or unusual colored stools (red or black).   Unusual bruising for unknown reasons.   A serious fall or if you hit your head (even if there is no bleeding).  Some medicines may  interact with Eliquis and might increase your risk of bleeding or clotting while on Eliquis. To help avoid this, consult your healthcare provider or pharmacist prior to using any new prescription or non-prescription medications, including herbals, vitamins, non-steroidal anti-inflammatory drugs (NSAIDs) and supplements.  This website has more information on Eliquis (apixaban): [email or web address].

## 2013-08-03 NOTE — Progress Notes (Signed)
Pt places self on/off cpap/ 

## 2013-08-03 NOTE — Progress Notes (Signed)
Patient cont. To go in and out of At. Fib rate 90-120 then S.B 50's H.R. Has been dropping as low as 38 S.B while asleep and on CPAP  R.N. Aware.When awaken back into At. Fib 100-120. Patient voiced no complaints. Cont. To monitor patient and rhythm.

## 2013-08-03 NOTE — Progress Notes (Signed)
SUBJECTIVE:  No complaints  OBJECTIVE:   Vitals:   Filed Vitals:   08/02/13 2016 08/03/13 0424 08/03/13 0426 08/03/13 0430  BP: 109/70 132/78 130/67 132/78  Pulse: 68 52 71 77  Temp: 98.9 F (37.2 C) 98.3 F (36.8 C) 97.7 F (36.5 C) 97.7 F (36.5 C)  TempSrc: Oral Axillary Axillary Axillary  Resp: 17 16 17 18   Height:      Weight:      SpO2: 93% 98% 95% 95%   I&O's:   Intake/Output Summary (Last 24 hours) at 08/03/13 0719 Last data filed at 08/03/13 0400  Gross per 24 hour  Intake   1200 ml  Output   1750 ml  Net   -550 ml   TELEMETRY: Reviewed telemetry pt in NSR with frequent episodes of PAF with RVR:     PHYSICAL EXAM General: Well developed, well nourished, in no acute distress Head: Eyes PERRLA, No xanthomas.   Normal cephalic and atramatic  Lungs:   Clear bilaterally to auscultation and percussion. Heart:   HRRR S1 S2 Pulses are 2+ & equal. Abdomen: Bowel sounds are positive, abdomen soft and non-tender without masses  Extremities:   No clubbing, cyanosis or edema.  DP +1 Neuro: Alert and oriented X 3. Psych:  Good affect, responds appropriately   LABS: Basic Metabolic Panel:  Recent Labs  08/02/13 0415  NA 143  K 3.5*  CL 103  CO2 27  GLUCOSE 97  BUN 15  CREATININE 1.09  CALCIUM 8.1*   Liver Function Tests:  Recent Labs  08/02/13 0415  AST 56*  ALT 130*  ALKPHOS 362*  BILITOT 0.6  PROT 5.6*  ALBUMIN 2.1*   No results found for this basename: LIPASE, AMYLASE,  in the last 72 hours CBC:  Recent Labs  08/01/13 0855 08/02/13 0415  WBC 11.7* 10.0  HGB 8.8* 9.0*  HCT 28.0* 28.2*  MCV 89.2 88.7  PLT 386 412*   Cardiac Enzymes: No results found for this basename: CKTOTAL, CKMB, CKMBINDEX, TROPONINI,  in the last 72 hours BNP: No components found with this basename: POCBNP,  D-Dimer: No results found for this basename: DDIMER,  in the last 72 hours Hemoglobin A1C: No results found for this basename: HGBA1C,  in the last 72  hours Fasting Lipid Panel: No results found for this basename: CHOL, HDL, LDLCALC, TRIG, CHOLHDL, LDLDIRECT,  in the last 72 hours Thyroid Function Tests: No results found for this basename: TSH, T4TOTAL, FREET3, T3FREE, THYROIDAB,  in the last 72 hours Anemia Panel: No results found for this basename: VITAMINB12, FOLATE, FERRITIN, TIBC, IRON, RETICCTPCT,  in the last 72 hours Coag Panel:   No results found for this basename: INR, PROTIME    RADIOLOGY: Ct Abdomen Pelvis Wo Contrast  07/31/2013   CLINICAL DATA:  Decreasing hemoglobin on Xarelto.  EXAM: CT ABDOMEN AND PELVIS WITHOUT CONTRAST  TECHNIQUE: Multidetector CT imaging of the abdomen and pelvis was performed following the standard protocol without intravenous contrast.  COMPARISON:  CT of the abdomen and pelvis 11/28/2012.  FINDINGS: Lung Bases: Small to moderate left pleural effusion. Trace right pleural effusion. Small amount of intermediate attenuation pericardial fluid and/or thickening. No associated pericardial calcification in the visualized portions of the thorax. Probable passive atelectasis in much of the left lower lobe.  Abdomen/Pelvis: There is a LapBand in position adjacent to the gastroesophageal junction. The unenhanced appearance of the liver, pancreas, spleen and bilateral adrenal glands is unremarkable. Status post cholecystectomy. Numerous low-attenuation lesions in the  kidneys bilaterally and in the renal pelvises bilaterally are incompletely characterized on today's non contrast CT examination, but are statistically likely to represent renal cysts and renal sinus cysts, the largest of which is in the right renal pelvis measuring 4.2 cm in diameter.  No high attenuation fluid within the peritoneal cavity or retroperitoneum to suggest significant hemorrhage. No significant volume of ascites. No pneumoperitoneum. No pathologic distention of small bowel. Extensive colonic diverticulosis, particularly of the sigmoid colon, without  surrounding inflammatory changes to suggest acute diverticulitis at this time. No pathologically enlarged lymph nodes are noted in the abdomen or pelvis on today's non contrast CT examination. Atherosclerosis throughout the abdominal and pelvic vasculature, without definite aneurysm. Prostate gland and urinary bladder are unremarkable in appearance.  Musculoskeletal: There are no aggressive appearing lytic or blastic lesions noted in the visualized portions of the skeleton.  IMPRESSION: 1. No occult hemorrhage identified. 2. Small amount of intermediate attenuation pericardial fluid and/or thickening. Clinical correlation for signs symptoms of acute pericarditis is recommended. 3. Small to moderate left and passed trace right-sided pleural effusions layering dependently, with passive atelectasis in much of the left lower lobe. 4. Extensive colonic diverticulosis without findings to suggest acute diverticulitis at this time. 5. Normal appendix. 6. Numerous renal lesions bilaterally, favored to reflect a combination of renal cysts and renal pelvis cysts. 7. Additional incidental findings, as above.   Electronically Signed   By: Vinnie Langton M.D.   On: 07/31/2013 18:41   Dg Chest 2 View  07/28/2013   CLINICAL DATA:  Chest congestion.  Short of breath.  EXAM: CHEST  2 VIEW  COMPARISON:  07/22/2013  FINDINGS: Cardiac silhouette is normal in size and configuration. Normal mediastinal and hilar contours.  Clear lungs.  No pleural effusion.  No pneumothorax.  Bony thorax is intact.  IMPRESSION: No acute cardiopulmonary disease.   Electronically Signed   By: Lajean Manes M.D.   On: 07/28/2013 13:21   Dg Chest 2 View  07/15/2013   CLINICAL DATA:  Dyspnea and chest pain.  EXAM: CHEST  2 VIEW  COMPARISON:  07/13/2013 and 09/26/2012  FINDINGS: Lungs are adequately inflated with continued evidence of a small left pleural effusion with possible slight interval improvement. There is mild stable cardiomegaly. Remainder  the exam is unchanged.  IMPRESSION: Continued small left pleural effusion with possible slight interval improvement.   Electronically Signed   By: Marin Olp M.D.   On: 07/15/2013 11:32   Dg Chest 2 View  07/13/2013   CLINICAL DATA:  Weakness and chest pain.  EXAM: CHEST - 2 VIEW  COMPARISON:  DG CHEST 2 VIEW dated 09/26/2012; DG THORACIC SPINE dated 01/19/2011  FINDINGS: The heart is mildly enlarged. Mild interstitial and pulmonary vascular prominence identified without evidence of overt edema. There is a small left pleural effusion. Bony structures show stable mild degenerative disease of the thoracic spine.  IMPRESSION: Cardiomegaly and pulmonary interstitial prominence with small left pleural effusion. No overt edema is identified   Electronically Signed   By: Aletta Edouard M.D.   On: 07/13/2013 18:16   Ct Angio Chest Pe W/cm &/or Wo Cm  07/16/2013   CLINICAL DATA:  Shortness of breath, neck/back pain, elevated D-dimer, evaluate for PE. Lap band surgery in January.  EXAM: CT ANGIOGRAPHY CHEST WITH CONTRAST  TECHNIQUE: Multidetector CT imaging of the chest was performed using the standard protocol during bolus administration of intravenous contrast. Multiplanar CT image reconstructions and MIPs were obtained to evaluate the vascular  anatomy.  CONTRAST:  53mL OMNIPAQUE IOHEXOL 350 MG/ML SOLN  COMPARISON:  None.  FINDINGS: Tiny subsegmental filling defects within branches of the right lower lobe pulmonary artery (series 5/ images 169, 170, and 189), suspicious for pulmonary emboli. Overall clot burden is small. No findings to suggest right heart strain (RV-to-LV ratio 0.87).  Small to moderate left and small right pleural effusions. No frank interstitial edema. No pneumothorax.  Visualized right thyroid is notable for a suspected 2.2 cm thyroid nodule (series 4/ image 1), incompletely visualized.  The heart is top-normal in size. Small to moderate pericardial effusion with a mildly irregular appearance.  Mild atherosclerotic calcifications of the aortic arch.  No suspicious mediastinal, hilar, or axillary lymphadenopathy.  Visualized upper abdomen is notable for a laparoscopic band in satisfactory position.  Degenerative changes of the visualized thoracolumbar spine.  Review of the MIP images confirms the above findings.  IMPRESSION: Suspected tiny subsegmental pulmonary emboli within branches of the right lower lobe pulmonary artery. Overall clot burden is small.  Small to moderate left and small right pleural effusions. No frank interstitial edema.  Small to moderate pericardial effusion. Given the irregular appearance, correlate for pericarditis.  Critical Value/emergent results were called by telephone at the time of interpretation on 07/16/2013 at 5:28 PM to Dr. Gwyndolyn Saxon HOPPER, who verbally acknowledged these results.   Electronically Signed   By: Julian Hy M.D.   On: 07/16/2013 17:29   US Abdomen Limited  07/13/2013   CLINICAL DATA:  Increased liver function tests, right upper quadrant pain  EXAM: US ABDOMEN LIMITED - RIGHT UPPER QUADRANT  COMPARISON:  None.  FINDINGS: Gallbladder:  Surgically at  Common bile duct:  Diameter: 7 mm  Liver:  No focal abnormalities.  IMPRESSION: No acute abnormalities   Electronically Signed   By: Skipper Cliche M.D.   On: 07/13/2013 20:37   Dg Chest Port 1 View  07/22/2013   CLINICAL DATA:  Pericardial effusion.  Pericardial window.  EXAM: PORTABLE CHEST - 1 VIEW  COMPARISON:  DG CHEST 1V PORT dated 07/21/2013  FINDINGS: Right IJ line in stable position. Stable cardiomegaly. No pulmonary venous distention. Persistent mild left base subsegmental atelectasis remains. Mild elevation left hemidiaphragm. No prominent pleural effusion or pneumothorax noted. No acute osseous abnormality.  IMPRESSION: 1. Right IJ line in stable position. 2. Stable cardiomegaly. 3. Persistent left base subsegmental atelectasis.   Electronically Signed   By: Marcello Moores  Register   On: 07/22/2013  07:22   Dg Chest Port 1 View  07/21/2013   CLINICAL DATA:  Pericardial window.  EXAM: PORTABLE CHEST - 1 VIEW  COMPARISON:  DG CHEST PORT 1VSAME DAY dated 07/20/2013; CT ANGIO CHEST W/CM &/OR WO/CM dated 07/16/2013  FINDINGS: Mediastinum hilar structures are normal. Right IJ line in stable position. Stable cardiomegaly and pulmonary vascularity. Mild left base subsegmental atelectasis. No pneumothorax.  IMPRESSION: 1. Right IJ line in stable position. 2. Stable cardiomegaly, no CHF. 3. Mild left lung base atelectasis   Electronically Signed   By: [place]   On: 07/21/2013 10:30   Dg Chest Port 1 View  07/18/2013   CLINICAL DATA:  Cough.  Status post pericardial window procedure.  EXAM: PORTABLE CHEST - 1 VIEW  COMPARISON:  CT ANGIO CHEST W/CM &/OR WO/CM dated 07/16/2013; DG CHEST 2 VIEW dated 07/15/2013  FINDINGS: Right IJ line tip: Upper SVC. Moderately enlarged cardiopericardial silhouette. Indistinct pulmonary vasculature compatible with pulmonary venous hypertension.  Right costophrenic angle excluded. No pneumothorax or significant pneumomediastinum  observed. Retrocardiac density suggests mild atelectasis in the left lower lobe.  IMPRESSION: 1. Continued enlargement of the cardiopericardial silhouette, with underlying pulmonary venous hypertension. 2. Mild atelectasis in the left lower lobe. 3. Right IJ line tip:  Upper SVC.   Electronically Signed   By: Sherryl Barters M.D.   On: 07/18/2013 15:01   Dg Chest Port 1v Same Day  07/20/2013   CLINICAL DATA:  68 year old male status post pericardial window for effusion. Initial encounter.  EXAM: PORTABLE CHEST - 1 VIEW SAME DAY  COMPARISON:  07/18/2013 and earlier.  FINDINGS: Portable AP upright view at 0722 hr. Stable right IJ central line. Stable cardiac size and mediastinal contours. No pneumothorax or edema. Continued retrocardiac opacity but low the less veiling opacity at the lung bases.  IMPRESSION: 1. Stable cardiac silhouette since 07/18/2013.  2. Suspect regressed small pleural effusions but continued lower lobe collapse or consolidation.   Electronically Signed   By: Lars Pinks M.D.   On: 07/20/2013 13:28   US Abdomen Limited Ruq  07/29/2013   CLINICAL DATA:  Elevated hepatic function studies  EXAM: US ABDOMEN LIMITED - RIGHT UPPER QUADRANT  COMPARISON:  US ABDOMEN LIMITED dated 07/13/2013; CT ANGIO CHEST W/CM &/OR WO/CM dated 07/16/2013; CT UROGRAM dated 11/28/2012  FINDINGS: Gallbladder:  The gallbladder is surgically absent.  Common bile duct:  Diameter: 5.5 mm.  Liver:  The hepatic echotexture is heterogeneous. No discrete mass is demonstrated. There is no intrahepatic ductal dilation.  IMPRESSION: Heterogeneous echotexture of the liver may reflect fatty infiltration. Given the elevated hepatic function studies, abdominal CT scanning or MRI may be useful next steps.   Electronically Signed   By: David  [place]   On: 07/29/2013 01:55    Assessment/Plan  Principal Problem:  Hypotension - improved  Active Problems:  PAF (paroxysmal atrial fibrillation) with RVR- multiple episodes of recurring PAF in hospital. Borderline low BP limits titration of beta blockers. Elevated LFTs precludes use of Amio. Hopefully once anemia resolves he will maintain NSR.  May need to consider TIkosyn.  Discussed with Dr. Rayann Heman today.  He recommends starting Flecainide 50mg  BID.  His EKG is abnormal with T wave inversions in the inferolateral leads but no history of CAD.  Will start on Flecainide and make NPO after MN for Lexiscan myoview.  He will need a 2 day study so will see if rest images can be done today. Pulmonary thromboembolism--now back on anticoagulation - Eliquis started since patient cannot afford Xarelto  Pericardial effusion s/p pericardial window 07/18/13 400cc- minimal residual effusion on echo 07/29/13  SLEEP APNEA with c pap  H/O laparoscopic adjustable gastric banding & hiatal hernia repair 04/08/13  Hepatitis  Chronic anticoagulation-now on  Eliquis  Gastric ulcer with hemorrhage 07/20/13--PPI BID  Leukocytosis  Anemia-some improvement  DOE with any exertion.  Acute on chronic diastolic CHF -  Appears compensated on PO Lasix    Sueanne Margarita, MD  08/03/2013  7:19 AM

## 2013-08-04 DIAGNOSIS — Z9884 Bariatric surgery status: Secondary | ICD-10-CM

## 2013-08-04 DIAGNOSIS — R079 Chest pain, unspecified: Secondary | ICD-10-CM

## 2013-08-04 LAB — CULTURE, BLOOD (ROUTINE X 2): CULTURE: NO GROWTH

## 2013-08-04 LAB — BASIC METABOLIC PANEL
BUN: 16 mg/dL (ref 6–23)
CALCIUM: 8.2 mg/dL — AB (ref 8.4–10.5)
CO2: 29 mEq/L (ref 19–32)
CREATININE: 0.97 mg/dL (ref 0.50–1.35)
Chloride: 103 mEq/L (ref 96–112)
GFR calc Af Amer: >90 mL/min (ref >90)
GFR calc non Af Amer: 84 mL/min — ABNORMAL LOW (ref >90)
GLUCOSE: 89 mg/dL (ref 70–99)
Potassium: 3.9 mEq/L (ref 3.7–5.3)
Sodium: 143 mEq/L (ref 137–147)

## 2013-08-04 LAB — CBC
HCT: 28.9 % — ABNORMAL LOW (ref 39.0–52.0)
Hemoglobin: 8.9 g/dL — ABNORMAL LOW (ref 13.0–17.0)
MCH: 27.7 pg (ref 26.0–34.0)
MCHC: 30.8 g/dL (ref 30.0–36.0)
MCV: 90 fL (ref 78.0–100.0)
PLATELETS: 375 10*3/uL (ref 150–400)
RBC: 3.21 MIL/uL — ABNORMAL LOW (ref 4.22–5.81)
RDW: 16.3 % — AB (ref 11.5–15.5)
WBC: 7.6 10*3/uL (ref 4.0–10.5)

## 2013-08-04 MED ORDER — POTASSIUM CHLORIDE CRYS ER 10 MEQ PO TBCR: 10.0000 meq | EXTENDED_RELEASE_TABLET | Freq: Three times a day (TID) | ORAL | Status: DC

## 2013-08-04 MED ORDER — TECHNETIUM TC 99M SESTAMIBI GENERIC - CARDIOLITE
30.0000 | Freq: Once | INTRAVENOUS | Status: AC | PRN
  Administered 2013-08-04: 30 via INTRAVENOUS

## 2013-08-04 MED ORDER — ONDANSETRON 4 MG PO TBDP: 4.0000 mg | ORAL_TABLET | Freq: Three times a day (TID) | ORAL | Status: DC | PRN

## 2013-08-04 MED ORDER — REGADENOSON 0.4 MG/5ML IV SOLN
INTRAVENOUS | Status: AC
  Filled 2013-08-04: qty 5

## 2013-08-04 MED ORDER — FLUTICASONE PROPIONATE 50 MCG/ACT NA SUSP: 2.0000 | Freq: Every day | NASAL | Status: DC

## 2013-08-04 MED ORDER — FLECAINIDE ACETATE 50 MG PO TABS: 50.0000 mg | ORAL_TABLET | Freq: Two times a day (BID) | ORAL | Status: DC

## 2013-08-04 MED ORDER — LORATADINE 10 MG PO TABS: 10.0000 mg | ORAL_TABLET | Freq: Every day | ORAL | Status: DC

## 2013-08-04 MED ORDER — BENZONATATE 200 MG PO CAPS: 200.0000 mg | ORAL_CAPSULE | Freq: Three times a day (TID) | ORAL | Status: DC | PRN

## 2013-08-04 MED ORDER — POLYETHYLENE GLYCOL 3350 17 G PO PACK: 17.0000 g | PACK | Freq: Every day | ORAL | Status: DC

## 2013-08-04 MED ORDER — APIXABAN 5 MG PO TABS: 5.0000 mg | ORAL_TABLET | Freq: Two times a day (BID) | ORAL | Status: DC

## 2013-08-04 MED ORDER — REGADENOSON 0.4 MG/5ML IV SOLN
0.4000 mg | Freq: Once | INTRAVENOUS | Status: AC
  Administered 2013-08-04: 0.4 mg via INTRAVENOUS

## 2013-08-04 NOTE — Progress Notes (Signed)
Physical Therapy Treatment Patient Details Name: [name] MRN: [id number] DOB: 27-Sep-1945 Today's Date: 08/04/2013    History of Present Illness [name] is a 68 y.o. male who returns to the ED with progressively worsening fatigue since his discharge on 4/24.  He was recently hospitalized from 4/15 to 4/24 for pericarditis and had a pericardial window at that time.  Cultures of the pericardial fluid haven't grown anything out a this point.  He reports BP consistently in the 80s when checked at home.  Fatigue is worse with any activity, however he is feeling ok as long as he is lying down in bed.  During his hospital stay amlodipine was stopped and metoprolol was added to his med list.  No chest pain, no vomiting, no syncope, no diarrhea.  Seen by Dr. [name] earlier today and was found to have significantly elevated WBC of 21k and elevated LFTs    PT Comments    Pt admitted with above. Pt currently with functional limitations due to balance and endurance deficits.  Pt will benefit from skilled PT to increase their independence and safety with mobility to allow discharge to the venue listed below.   Follow Up Recommendations  Home health PT;Supervision/Assistance - 24 hour     Equipment Recommendations  None recommended by PT    Recommendations for Other Services       Precautions / Restrictions Precautions Precautions: Fall Restrictions Weight Bearing Restrictions: No    Mobility  Bed Mobility Overal bed mobility: Modified Independent                Transfers Overall transfer level: Needs assistance Equipment used: None Transfers: Sit to/from Stand Sit to Stand: Supervision         General transfer comment: Pt demonstrated proper hand placement and safety awareness with transfers.Steadying assist needed upon initial standing.   Ambulation/Gait Ambulation/Gait assistance: Min guard Ambulation Distance (Feet): 350 Feet Assistive device: None Gait  Pattern/deviations: WFL(Within Functional Limits)   Gait velocity interpretation: Below normal speed for age/gender General Gait Details: Pt without LOBwith min challenges however continues with DOE 3/4 at end of walk.  Pt cannot withstand moderate challenges to balance at this time.     Stairs            Wheelchair Mobility    Modified Rankin (Stroke Patients Only)       Balance Overall balance assessment: Needs assistance;History of Falls Sitting-balance support: No upper extremity supported;Feet supported Sitting balance-Leahy Scale: Fair     Standing balance support: No upper extremity supported;During functional activity Standing balance-Leahy Scale: Fair Standing balance comment: Pt can stand statically without UE support however cannot accept challenges to balance unless pt has RW or UE support.               High level balance activites: Direction changes;Turns;Sudden stops;Head turns High Level Balance Comments: Needs min assist for high level balance activities without device.      Cognition Arousal/Alertness: Awake/alert Behavior During Therapy: WFL for tasks assessed/performed Overall Cognitive Status: Within Functional Limits for tasks assessed                      Exercises General Exercises - Lower Extremity Ankle Circles/Pumps: AROM;Both;10 reps;Supine Long Arc Quad: AROM;Both;10 reps;Seated Hip Flexion/Marching: AROM;Both;10 reps;Seated    General Comments        Pertinent Vitals/Pain VSS, no pain    Home Living  Prior Function            PT Goals (current goals can now be found in the care plan section) Progress towards PT goals: Progressing toward goals    Frequency  Min 2X/week    PT Plan Current plan remains appropriate    Co-evaluation             End of Session Equipment Utilized During Treatment: Gait belt Activity Tolerance: Patient limited by fatigue Patient left: with call  bell/phone within reach;in bed;with nursing/sitter in room     Time: 0905-0930 PT Time Calculation (min): 25 min  Charges:  $Gait Training: 8-22 mins $Therapeutic Exercise: 8-22 mins                    G Codes:      Madalin Hughart Dorene Ar 09/02/13, 10:10 AM  [facility] [phone] (pager)

## 2013-08-04 NOTE — Progress Notes (Signed)
NUTRITION CONSULT/FOLLOW UP  INTERVENTION: Premiere Protein supplement as needed -- pt brining in from home RD to follow for nutrition care plan  NUTRITION DIAGNOSIS: Inadequate oral intake related to limited appetite as evidenced by patient report, ongoing  Goal: Pt to meet >/= 90% of their estimated nutrition needs, progressing  Monitor:  PO & supplemental intake, weight, labs, I/O's  ASSESSMENT: Pt is a 68 y.o. male who returns to the ED with progressively worsening fatigue since his discharge on 07/25/13. He was recently hospitalized from 4/15 to 4/24 for pericarditis and had a pericardial window at that time. Admitted with fatigue, found to have hypotension. Hx of gastric band surgery January 2015. Has been followed by outpatient bariatric RD. MD suspects pt with possible GI bleed.   Initial nutrition assessment completed 4/28.  Patient s/p Lap Band surgery January 2015.  Has been followed by outpatient bariatric RD.  He states he knows what diet guidelines to follow in regards to this, however, at this time he feels comfortable with continuing to receive a Heart Healthy diet during his hospitalization.  PO intake variable at 25-100% per flowsheet records.  He does have his Premiere Protein shakes available from home.  Height: Ht Readings from Last 1 Encounters:  07/29/13 6' (1.829 m)    Weight: Wt Readings from Last 1 Encounters:  08/04/13 296 lb 8.3 oz (134.5 kg)    BMI:  Body mass index is 40.21 kg/(m^2).   Estimated Nutritional Needs: Kcal: 1850-2050 (for ongoing weight loss) Protein: 80-100g Fluid: per MD  Diet Order: Cardiac   Intake/Output Summary (Last 24 hours) at 08/04/13 1255 Last data filed at 08/04/13 0745  Gross per 24 hour  Intake    480 ml  Output      0 ml  Net    480 ml    Labs:   Recent Labs Lab 08/02/13 0415 08/03/13 0815 08/04/13 0540  NA 143 145 143  K 3.5* 3.6* 3.9  CL 103 104 103  CO2 27 25 29   BUN 15 15 16   CREATININE 1.09  0.98 0.97  CALCIUM 8.1* 8.5 8.2*  MG  --  2.2  --   GLUCOSE 97 122* 89    Scheduled Meds: . apixaban  5 mg Oral BID  . benzonatate  200 mg Oral TID  . finasteride  5 mg Oral QHS  . flecainide  50 mg Oral Q12H  . fluticasone  2 spray Each Nare Daily  . furosemide  40 mg Intravenous Once  . furosemide  20 mg Oral Daily  . loratadine  10 mg Oral Daily  . LORazepam  0.5-1 mg Oral BID  . metoprolol  5 mg Intravenous Once  . metoprolol tartrate  12.5 mg Oral BID  . multivitamin  5 mL Oral Daily  . pantoprazole  40 mg Oral BID  . polyethylene glycol  17 g Oral Daily  . potassium chloride  10 mEq Oral TID  . regadenoson      . risperiDONE  0.5 mg Oral QHS  . sertraline  150 mg Oral Q breakfast  . sodium chloride  3 mL Intravenous Q12H    Continuous Infusions:   Past Medical History  Diagnosis Date  . Benign prostatic hypertrophy     several prostate biopsies neg for cancer.   . Hypertension   . Gout   . Depression   . FH: colonic polyps 2010  . Morbid obesity   . Complication of anesthesia     MORPHINE CAUSES  HALLUCINATIONS  . Asthma     childhood asthma  . Numbness     feet / legs  . History of kidney stones   . Kidney cysts   . Sleep apnea     USES C PAP  . Bilateral hip pain   . PAF (paroxysmal atrial fibrillation) with RVR 07/29/2013  . Anemia 07/29/2013    Past Surgical History  Procedure Laterality Date  . Total knee replacment      bilat  . Cholecystectomy  1989  . Trigger finger repair      also lue, cts surgically repaired  . Renal calculi    . Tonsillectomy    . Lithotripsy    . Arthroscopy knee w/ drilling    . Breath tek h pylori N/A 12/23/2012    Procedure: BREATH TEK H PYLORI;  Surgeon: Gayland Curry, MD;  Location: [facility];  Service: General;  Laterality: N/A;  . Wisdom tooth extraction    . Laparoscopic gastric banding with hiatal hernia repair N/A 04/08/2013    Procedure: LAPAROSCOPIC GASTRIC BANDING WITH possible  HIATAL HERNIA REPAIR;   Surgeon: Gayland Curry, MD;  Location: [facility];  Service: General;  Laterality: N/A;  . Subxyphoid pericardial window N/A 07/18/2013    Procedure: SUBXYPHOID PERICARDIAL WINDOW;  Surgeon: Grace Isaac, MD;  Location: [place];  Service: Thoracic;  Laterality: N/A;  . Intraoperative transesophageal echocardiogram N/A 07/18/2013    Procedure: INTRAOPERATIVE TRANSESOPHAGEAL ECHOCARDIOGRAM;  Surgeon: Grace Isaac, MD;  Location: [place];  Service: Open Heart Surgery;  Laterality: N/A;  . Esophagogastroduodenoscopy N/A 07/22/2013    Procedure: ESOPHAGOGASTRODUODENOSCOPY (EGD);  Surgeon: Irene Shipper, MD;  Location: [facility];  Service: Endoscopy;  Laterality: N/A;  . Colonoscopy N/A 07/31/2013    Procedure: COLONOSCOPY;  Surgeon: Gatha Mayer, MD;  Location: [place];  Service: Endoscopy;  Laterality: N/A;  . Esophagogastroduodenoscopy N/A 07/31/2013    Procedure: ESOPHAGOGASTRODUODENOSCOPY (EGD);  Surgeon: Gatha Mayer, MD;  Location: [facility]COPY;  Service: Endoscopy;  Laterality: N/A;    Arthur Holms, RD, LDN Pager #: [phone] After-Hours Pager #: [phone]

## 2013-08-04 NOTE — Discharge Summary (Signed)
Physician Discharge Summary  [name] [id number] DOB: November 04, 1945 DOA: 07/28/2013  PCP: [name], MD  Admit date: 07/28/2013 Discharge date: 08/04/2013  Time spent: 35 minutes  Recommendations for Outpatient Follow-up:  1. Cbc, cmp follow up 2. pulm follow up   Discharge Diagnoses:  Principal Problem:   Upper GI bleed- recurrent Active Problems:   SLEEP APNEA- on C-pap   Morbid obesity   H/O laparoscopic adjustable gastric banding & hiatal hernia repair 04/08/13   Pulmonary thromboembolism- 07/16/13   Pericardial effusion s/p pericardial window 07/18/13 400cc   Pericarditis   Hepatitis   Chronic anticoagulation- on hold   Gastric ulcer with hemorrhage 07/20/13   Leukocytosis   PAF (paroxysmal atrial fibrillation) with RVR   Anemia   Hypotension, unspecified   Benign neoplasm of colon   Abnormal transaminases   Abnormal serum level of alkaline phosphatase   Discharge Condition: improved  Diet recommendation: cardiac  Filed Weights   07/29/13 0229 08/03/13 1700 08/04/13 0643  Weight: 134.1 kg (295 lb 10.2 oz) 138.1 kg (304 lb 7.3 oz) 134.5 kg (296 lb 8.3 oz)    History of present illness:  [name] is a 68 y.o. male who returns to the ED with progressively worsening fatigue since his discharge on 4/24. He was recently hospitalized from 4/15 to 4/24 for pericarditis and had a pericardial window at that time. Cultures of the pericardial fluid haven't grown anything out a this point. He reports BP consistently in the 80s when checked at home. Fatigue is worse with any activity, however he is feeling ok as long as he is lying down in bed. During his hospital stay amlodipine was stopped and metoprolol was added to his med list. No chest pain, no vomiting, no syncope, no diarrhea. Seen by Dr. [name] earlier today and was found to have significantly elevated WBC of 21k and elevated LFTs.   Hospital Course:  Recent UGI bleed with continued heme positive stool   Status post 2 units of packed red blood cells. Hemoglobin rose appropriately and is at 10 today. Patient feeling much stronger after transfusion.  -Had upper GI bleed on recent hospitalization, EGD, showed superficial gastric ulcers. Patient was discharged on Protonix 40 mg by mouth twice a day. Repeat EGD showed healing of ulcers, no bleed. Colonoscopy showed polyps no bleeding. CT of the abdomen and pelvis showed no occult intra-abdominal or retroperitoneal bleed.   PAF: Patient has been in and out of atrial fibrillation with rapid ventricular response. Blood pressure has limited the ability to increase metoprolol much. Cardiology has started Flecanide. For 2 day stress test ok  Acute on chronic anemia: Improved.  -monitor as outpatient  Hypotension  Resolved.   History of pulmonary embolism.  Changed xarelto to eliquis due to coverage   History of large pericardial effusion in setting of pericarditis  -Repeat transthoracic echocardiogram showed small circumferential pericardial effusion.  Per Dr. Radford Pax, okay to stop colchicine, as may have contributed to increased LFTs.  - follow up CMP as outpatient  Generalized weakness/fatigue  Much better with higher hemoglobin. Patient walked to the nursing station and back twice yesterday.   Elevated transaminases  US shows only fatty liver. Shock liver versus medication related. cochicine stopped. Fu with Dr. [name] after discharge. Slowly improving.   Leukocytosis  Resolved without abx   Chronic cough for months: Chest x-ray negative. May be related to chronic sinusitis/allergic rhinitis/postnasal drip. Continue Flonase. Also may be reflux related and has maxed out PPI. Will  need followup with pulmonary medicine as an outpatient for further workup if continues. Better on current regimen.   History of lap band procedure: Patient reports he is not on a bariatric diet. Will consult dietitian.       Procedures:  none  Consultations:  GI  cards  Discharge Exam: Filed Vitals:   08/04/13 1349  BP: 133/75  Pulse: 61  Temp: 98.5 F (36.9 C)  Resp: 20    General: A+Ox3, NAD Cardiovascular: rrr Respiratory: clear  Discharge Instructions You were cared for by a hospitalist during your hospital stay. If you have any questions about your discharge medications or the care you received while you were in the hospital after you are discharged, you can call the unit and asked to speak with the hospitalist on call if the hospitalist that took care of you is not available. Once you are discharged, your primary care physician will handle any further medical issues. Please note that NO REFILLS for any discharge medications will be authorized once you are discharged, as it is imperative that you return to your primary care physician (or establish a relationship with a primary care physician if you do not have one) for your aftercare needs so that they can reassess your need for medications and monitor your lab values.  Discharge Orders   Future Appointments Provider Department Dept Phone   08/07/2013 10:00 AM [facility] [place]   08/08/2013 2:00 PM Nelida Gores [facility] [id number]   08/14/2013 12:15 PM Grace Isaac, MD [facility] [phone]   10/08/2013 11:00 AM Lady Saucier, RD [facility] [phone]   Future Orders Complete By Expires   Diet - low sodium heart healthy  As directed    Discharge instructions  As directed    Increase activity slowly  As directed        Medication List    STOP taking these medications       rivaroxaban 20 MG Tabs tablet  Commonly known as:  XARELTO      TAKE these medications       apixaban 5 MG Tabs tablet  Commonly known as:  ELIQUIS  Take 1 tablet (5 mg total) by mouth 2  (two) times daily.     benzonatate 200 MG capsule  Commonly known as:  TESSALON  Take 1 capsule (200 mg total) by mouth 3 (three) times daily as needed for cough.     DSS 100 MG Caps  Take 100 mg by mouth 2 (two) times daily.     finasteride 5 MG tablet  Commonly known as:  PROSCAR  Take 5 mg by mouth daily.     flecainide 50 MG tablet  Commonly known as:  TAMBOCOR  Take 1 tablet (50 mg total) by mouth every 12 (twelve) hours.     fluticasone 50 MCG/ACT nasal spray  Commonly known as:  FLONASE  Place 2 sprays into both nostrils daily.     furosemide 20 MG tablet  Commonly known as:  LASIX  Take 1 tablet (20 mg total) by mouth daily.     loratadine 10 MG tablet  Commonly known as:  CLARITIN  Take 1 tablet (10 mg total) by mouth daily.     LORazepam 0.5 MG tablet  Commonly known as:  ATIVAN  Take 0.5-1 mg by mouth 2 (two) times daily. 0.5 mg in the morning and 1  mg at bedtime     metoprolol tartrate 25 MG tablet  Commonly known as:  LOPRESSOR  Take 12.5 mg by mouth 2 (two) times daily.     multivitamin tablet  Take 1 tablet by mouth daily. Chewable tablet with iron     ondansetron 4 MG disintegrating tablet  Commonly known as:  ZOFRAN-ODT  Take 1 tablet (4 mg total) by mouth every 8 (eight) hours as needed for nausea or vomiting.     pantoprazole 40 MG tablet  Commonly known as:  PROTONIX  Take 1 tablet (40 mg total) by mouth 2 (two) times daily.     polyethylene glycol packet  Commonly known as:  MIRALAX / GLYCOLAX  Take 17 g by mouth daily.     potassium chloride 10 MEQ tablet  Commonly known as:  K-DUR,KLOR-CON  Take 1 tablet (10 mEq total) by mouth 3 (three) times daily.     risperiDONE 0.5 MG tablet  Commonly known as:  RISPERDAL  Take 1 tablet (0.5 mg total) by mouth at bedtime.     ROBITUSSIN DM PO  Take 15 mLs by mouth daily as needed (cough).     sertraline 100 MG tablet  Commonly known as:  ZOLOFT  Take 1.5 tablets (150 mg total) by mouth daily  with breakfast.     VISINE OP  Place 2 drops into both eyes daily as needed (dry eyes).     Vitamin B-12 1000 MCG Subl  Place 1,000 mcg under the tongue every morning.       Allergies  Allergen Reactions  . Propoxyphene N-Acetaminophen Nausea Only  . Codeine     nausea  . Morphine     Nausea Severe hallucinations  . Penicillins     LOC with shot       Follow-up Information   Follow up with [name], MD In 1 week.   Specialty:  Internal Medicine   Contact information:   [address] [place] [address] [phone]       Follow up with Scarlette Shorts, MD In 1 month.   Specialty:  Gastroenterology   Contact information:   [address] [place] [address] [phone]        The results of significant diagnostics from this hospitalization (including imaging, microbiology, ancillary and laboratory) are listed below for reference.    Significant Diagnostic Studies: Ct Abdomen Pelvis Wo Contrast  07/31/2013   CLINICAL DATA:  Decreasing hemoglobin on Xarelto.  EXAM: CT ABDOMEN AND PELVIS WITHOUT CONTRAST  TECHNIQUE: Multidetector CT imaging of the abdomen and pelvis was performed following the standard protocol without intravenous contrast.  COMPARISON:  CT of the abdomen and pelvis 11/28/2012.  FINDINGS: Lung Bases: Small to moderate left pleural effusion. Trace right pleural effusion. Small amount of intermediate attenuation pericardial fluid and/or thickening. No associated pericardial calcification in the visualized portions of the thorax. Probable passive atelectasis in much of the left lower lobe.  Abdomen/Pelvis: There is a LapBand in position adjacent to the gastroesophageal junction. The unenhanced appearance of the liver, pancreas, spleen and bilateral adrenal glands is unremarkable. Status post cholecystectomy. Numerous low-attenuation lesions in the kidneys bilaterally and in the renal pelvises bilaterally are incompletely characterized on today's non  contrast CT examination, but are statistically likely to represent renal cysts and renal sinus cysts, the largest of which is in the right renal pelvis measuring 4.2 cm in diameter.  No high attenuation fluid within the peritoneal cavity or retroperitoneum to suggest  significant hemorrhage. No significant volume of ascites. No pneumoperitoneum. No pathologic distention of small bowel. Extensive colonic diverticulosis, particularly of the sigmoid colon, without surrounding inflammatory changes to suggest acute diverticulitis at this time. No pathologically enlarged lymph nodes are noted in the abdomen or pelvis on today's non contrast CT examination. Atherosclerosis throughout the abdominal and pelvic vasculature, without definite aneurysm. Prostate gland and urinary bladder are unremarkable in appearance.  Musculoskeletal: There are no aggressive appearing lytic or blastic lesions noted in the visualized portions of the skeleton.  IMPRESSION: 1. No occult hemorrhage identified. 2. Small amount of intermediate attenuation pericardial fluid and/or thickening. Clinical correlation for signs symptoms of acute pericarditis is recommended. 3. Small to moderate left and passed trace right-sided pleural effusions layering dependently, with passive atelectasis in much of the left lower lobe. 4. Extensive colonic diverticulosis without findings to suggest acute diverticulitis at this time. 5. Normal appendix. 6. Numerous renal lesions bilaterally, favored to reflect a combination of renal cysts and renal pelvis cysts. 7. Additional incidental findings, as above.   Electronically Signed   By: Vinnie Langton M.D.   On: 07/31/2013 18:41   Dg Chest 2 View  08/03/2013   CLINICAL DATA:  Cough and congestion.  EXAM: CHEST  2 VIEW  COMPARISON:  CT, 07/31/2013.  Chest radiograph, 07/28/2013.  FINDINGS: There is opacity at the left lung base reflecting a moderate pleural effusion with associated atelectasis.  Minimal right  effusion. Minor subsegmental right lung base atelectasis.  Lungs are otherwise clear.  Cardiac silhouette is mildly enlarged. Normal mediastinal and hilar contours.  The bony thorax is intact.  IMPRESSION: 1. Moderate left pleural effusion with associated atelectasis. Minimal right pleural effusion with minimal subsegmental right basal atelectasis. 2. No convincing infiltrate. No pulmonary edema. Mild cardiomegaly.   Electronically Signed   By: Lajean Manes M.D.   On: 08/03/2013 17:23   Dg Chest 2 View  07/28/2013   CLINICAL DATA:  Chest congestion.  Short of breath.  EXAM: CHEST  2 VIEW  COMPARISON:  07/22/2013  FINDINGS: Cardiac silhouette is normal in size and configuration. Normal mediastinal and hilar contours.  Clear lungs.  No pleural effusion.  No pneumothorax.  Bony thorax is intact.  IMPRESSION: No acute cardiopulmonary disease.   Electronically Signed   By: Lajean Manes M.D.   On: 07/28/2013 13:21   Dg Chest 2 View  07/15/2013   CLINICAL DATA:  Dyspnea and chest pain.  EXAM: CHEST  2 VIEW  COMPARISON:  07/13/2013 and 09/26/2012  FINDINGS: Lungs are adequately inflated with continued evidence of a small left pleural effusion with possible slight interval improvement. There is mild stable cardiomegaly. Remainder the exam is unchanged.  IMPRESSION: Continued small left pleural effusion with possible slight interval improvement.   Electronically Signed   By: Marin Olp M.D.   On: 07/15/2013 11:32   Dg Chest 2 View  07/13/2013   CLINICAL DATA:  Weakness and chest pain.  EXAM: CHEST - 2 VIEW  COMPARISON:  DG CHEST 2 VIEW dated 09/26/2012; DG THORACIC SPINE dated 01/19/2011  FINDINGS: The heart is mildly enlarged. Mild interstitial and pulmonary vascular prominence identified without evidence of overt edema. There is a small left pleural effusion. Bony structures show stable mild degenerative disease of the thoracic spine.  IMPRESSION: Cardiomegaly and pulmonary interstitial prominence with small  left pleural effusion. No overt edema is identified   Electronically Signed   By: Aletta Edouard M.D.   On: 07/13/2013 18:16  Ct Angio Chest Pe W/cm &/or Wo Cm  07/16/2013   CLINICAL DATA:  Shortness of breath, neck/back pain, elevated D-dimer, evaluate for PE. Lap band surgery in January.  EXAM: CT ANGIOGRAPHY CHEST WITH CONTRAST  TECHNIQUE: Multidetector CT imaging of the chest was performed using the standard protocol during bolus administration of intravenous contrast. Multiplanar CT image reconstructions and MIPs were obtained to evaluate the vascular anatomy.  CONTRAST:  74mL OMNIPAQUE IOHEXOL 350 MG/ML SOLN  COMPARISON:  None.  FINDINGS: Tiny subsegmental filling defects within branches of the right lower lobe pulmonary artery (series 5/ images 169, 170, and 189), suspicious for pulmonary emboli. Overall clot burden is small. No findings to suggest right heart strain (RV-to-LV ratio 0.87).  Small to moderate left and small right pleural effusions. No frank interstitial edema. No pneumothorax.  Visualized right thyroid is notable for a suspected 2.2 cm thyroid nodule (series 4/ image 1), incompletely visualized.  The heart is top-normal in size. Small to moderate pericardial effusion with a mildly irregular appearance. Mild atherosclerotic calcifications of the aortic arch.  No suspicious mediastinal, hilar, or axillary lymphadenopathy.  Visualized upper abdomen is notable for a laparoscopic band in satisfactory position.  Degenerative changes of the visualized thoracolumbar spine.  Review of the MIP images confirms the above findings.  IMPRESSION: Suspected tiny subsegmental pulmonary emboli within branches of the right lower lobe pulmonary artery. Overall clot burden is small.  Small to moderate left and small right pleural effusions. No frank interstitial edema.  Small to moderate pericardial effusion. Given the irregular appearance, correlate for pericarditis.  Critical Value/emergent results were  called by telephone at the time of interpretation on 07/16/2013 at 5:28 PM to Dr. Gwyndolyn Saxon HOPPER, who verbally acknowledged these results.   Electronically Signed   By: Julian Hy M.D.   On: 07/16/2013 17:29   Nm Myocar Single W/spect W/wall Motion And Ef  08/04/2013   EXAM: MYOCARDIAL IMAGING WITH SPECT (REST AND PHARMACOLOGIC-STRESS - 2 DAY PROTOCOL)  GATED LEFT VENTRICULAR WALL MOTION STUDY  LEFT VENTRICULAR EJECTION FRACTION  TECHNIQUE: Standard myocardial SPECT imaging was performed after resting intravenous injection of 10 mCi Tc-33m sestamibi. Subsequently, on a second day, intravenous infusion of Lexiscan was performed under the supervision of the Cardiology staff. At peak effect of the drug, 30 mCi Tc-69m sestamibi was injected intravenously and standard myocardial SPECT imaging was performed. Quantitative gated imaging was also performed to evaluate left ventricular wall motion, and estimate left ventricular ejection fraction.  COMPARISON:  None.  FINDINGS: The patient underwent Lexiscan myoview stress testing under the supervision of the [facility] staff. No chest pain. The EKG at rest shows nonspeciific inferolateral T wave abnormalities which do not change with stress.  The quality of the images is good. Myocardial perfusion is normall. No ischemia or scar.  The end diastolic volume is 563 ml. The end systolic volume is 48 ml. The LV EF is 58%. There are no segmental wall motion abnormalities.  IMPRESSION: Normal 2 Day Lexiscan Myoview stress test.   Electronically Signed   By: Darlin Coco M.D.   On: 08/04/2013 13:13   US Abdomen Limited  07/13/2013   CLINICAL DATA:  Increased liver function tests, right upper quadrant pain  EXAM: US ABDOMEN LIMITED - RIGHT UPPER QUADRANT  COMPARISON:  None.  FINDINGS: Gallbladder:  Surgically at  Common bile duct:  Diameter: 7 mm  Liver:  No focal abnormalities.  IMPRESSION: No acute abnormalities   Electronically Signed   By: Kyung Rudd  Rubner M.D.    On: 07/13/2013 20:37   Dg Chest Port 1 View  07/22/2013   CLINICAL DATA:  Pericardial effusion.  Pericardial window.  EXAM: PORTABLE CHEST - 1 VIEW  COMPARISON:  DG CHEST 1V PORT dated 07/21/2013  FINDINGS: Right IJ line in stable position. Stable cardiomegaly. No pulmonary venous distention. Persistent mild left base subsegmental atelectasis remains. Mild elevation left hemidiaphragm. No prominent pleural effusion or pneumothorax noted. No acute osseous abnormality.  IMPRESSION: 1. Right IJ line in stable position. 2. Stable cardiomegaly. 3. Persistent left base subsegmental atelectasis.   Electronically Signed   By: Marcello Moores  Register   On: 07/22/2013 07:22   Dg Chest Port 1 View  07/21/2013   CLINICAL DATA:  Pericardial window.  EXAM: PORTABLE CHEST - 1 VIEW  COMPARISON:  DG CHEST PORT 1VSAME DAY dated 07/20/2013; CT ANGIO CHEST W/CM &/OR WO/CM dated 07/16/2013  FINDINGS: Mediastinum hilar structures are normal. Right IJ line in stable position. Stable cardiomegaly and pulmonary vascularity. Mild left base subsegmental atelectasis. No pneumothorax.  IMPRESSION: 1. Right IJ line in stable position. 2. Stable cardiomegaly, no CHF. 3. Mild left lung base atelectasis   Electronically Signed   By: [place]   On: 07/21/2013 10:30   Dg Chest Port 1 View  07/18/2013   CLINICAL DATA:  Cough.  Status post pericardial window procedure.  EXAM: PORTABLE CHEST - 1 VIEW  COMPARISON:  CT ANGIO CHEST W/CM &/OR WO/CM dated 07/16/2013; DG CHEST 2 VIEW dated 07/15/2013  FINDINGS: Right IJ line tip: Upper SVC. Moderately enlarged cardiopericardial silhouette. Indistinct pulmonary vasculature compatible with pulmonary venous hypertension.  Right costophrenic angle excluded. No pneumothorax or significant pneumomediastinum observed. Retrocardiac density suggests mild atelectasis in the left lower lobe.  IMPRESSION: 1. Continued enlargement of the cardiopericardial silhouette, with underlying pulmonary venous hypertension. 2.  Mild atelectasis in the left lower lobe. 3. Right IJ line tip:  Upper SVC.   Electronically Signed   By: Sherryl Barters M.D.   On: 07/18/2013 15:01   Dg Chest Port 1v Same Day  07/20/2013   CLINICAL DATA:  68 year old male status post pericardial window for effusion. Initial encounter.  EXAM: PORTABLE CHEST - 1 VIEW SAME DAY  COMPARISON:  07/18/2013 and earlier.  FINDINGS: Portable AP upright view at 0722 hr. Stable right IJ central line. Stable cardiac size and mediastinal contours. No pneumothorax or edema. Continued retrocardiac opacity but low the less veiling opacity at the lung bases.  IMPRESSION: 1. Stable cardiac silhouette since 07/18/2013. 2. Suspect regressed small pleural effusions but continued lower lobe collapse or consolidation.   Electronically Signed   By: Lars Pinks M.D.   On: 07/20/2013 13:28   US Abdomen Limited Ruq  07/29/2013   CLINICAL DATA:  Elevated hepatic function studies  EXAM: US ABDOMEN LIMITED - RIGHT UPPER QUADRANT  COMPARISON:  US ABDOMEN LIMITED dated 07/13/2013; CT ANGIO CHEST W/CM &/OR WO/CM dated 07/16/2013; CT UROGRAM dated 11/28/2012  FINDINGS: Gallbladder:  The gallbladder is surgically absent.  Common bile duct:  Diameter: 5.5 mm.  Liver:  The hepatic echotexture is heterogeneous. No discrete mass is demonstrated. There is no intrahepatic ductal dilation.  IMPRESSION: Heterogeneous echotexture of the liver may reflect fatty infiltration. Given the elevated hepatic function studies, abdominal CT scanning or MRI may be useful next steps.   Electronically Signed   By: David  Martin[place]   On: 07/29/2013 01:55    Microbiology: Recent Results (from the past 240 hour(s))  CULTURE, BLOOD (ROUTINE X  2)     Status: None   Collection Time    07/28/13 10:31 PM      Result Value Ref Range Status   Specimen Description BLOOD RIGHT ANTECUBITAL   Final   Special Requests     Final   Value: BOTTLES DRAWN AEROBIC AND ANAEROBIC 8CC BLUE 4CC RED   Culture  Setup Time     Final    Value: 07/29/2013 04:07     Performed at [facility]   Culture     Final   Value: DIPHTHEROIDS(CORYNEBACTERIUM SPECIES)     Note: Standardized susceptibility testing for this organism is not available.     Note: Gram Stain Report Called to,Read Back By and Verified With: MICHELLE MOORE 08/02/13 @ 5:26PM BY RUSCOE A.     Performed at [facility]   Report Status 08/04/2013 FINAL   Final  CULTURE, BLOOD (ROUTINE X 2)     Status: None   Collection Time    07/28/13 10:40 PM      Result Value Ref Range Status   Specimen Description BLOOD RIGHT FOREARM   Final   Special Requests BOTTLES DRAWN AEROBIC ONLY 4CC   Final   Culture  Setup Time     Final   Value: 07/29/2013 04:06     Performed at [facility]   Culture     Final   Value: NO GROWTH 5 DAYS     Performed at [facility]   Report Status 08/04/2013 FINAL   Final  URINE CULTURE     Status: None   Collection Time    07/28/13 11:04 PM      Result Value Ref Range Status   Specimen Description URINE, CATHETERIZED   Final   Special Requests NONE   Final   Culture  Setup Time     Final   Value: 07/29/2013 00:07     Performed at [place]     Final   Value: NO GROWTH     Performed at [facility]   Culture     Final   Value: NO GROWTH     Performed at [facility]   Report Status 07/30/2013 FINAL   Final     Labs: Basic Metabolic Panel:  Recent Labs Lab 07/30/13 0500 07/31/13 0510 08/02/13 0415 08/03/13 0815 08/04/13 0540  NA 140 140 143 145 143  K 4.2 3.9 3.5* 3.6* 3.9  CL 104 102 103 104 103  CO2 25 23 27 25 29   GLUCOSE 116* 119* 97 122* 89  BUN 21 20 15 15 16   CREATININE 1.09 1.00 1.09 0.98 0.97  CALCIUM 8.0* 8.1* 8.1* 8.5 8.2*  MG  --   --   --  2.2  --    Liver Function Tests:  Recent Labs Lab 07/30/13 0500 07/31/13 0510 08/02/13 0415 08/03/13 0815  AST 221* 223* 56* 56*  ALT 223* 250* 130* 113*  ALKPHOS 403* 443* 362* 368*   BILITOT 1.0 1.1 0.6 0.5  PROT 5.6* 5.6* 5.6* 6.3  ALBUMIN 2.1* 2.1* 2.1* 2.3*   No results found for this basename: LIPASE, AMYLASE,  in the last 168 hours No results found for this basename: AMMONIA,  in the last 168 hours CBC:  Recent Labs Lab 07/31/13 2100 08/01/13 0855 08/02/13 0415 08/03/13 0815 08/04/13 0540  WBC 11.8* 11.7* 10.0 9.2 7.6  HGB 8.1* 8.8* 9.0* 10.4* 8.9*  HCT 25.0* 28.0* 28.2* 32.8*  28.9*  MCV 88.7 89.2 88.7 88.9 90.0  PLT 430* 386 412* 430* 375   Cardiac Enzymes:  Recent Labs Lab 07/30/13 0500  CKTOTAL 21   BNP: BNP (last 3 results)  Recent Labs  07/13/13 1835 08/02/13 0415 08/03/13 0815  PROBNP 519.5* 2054.0* 977.7*   CBG: No results found for this basename: GLUCAP,  in the last 168 hours     Signed:  Geradine Girt  [facility] 08/04/2013, 2:05 PM

## 2013-08-04 NOTE — Progress Notes (Addendum)
Subjective: Pt is a 68 yo with hx of progressive dyspnea.    He reports a cough when walking and DOE.  He was recently admitted to the hospital from April 15 April 24 for pericarditis. He had a pericardial window at that time. The cultures arenegative at This time.  He is admitted after being found to have an elevated white blood cell count and elevated liver function test.     He's had paroxysmal atrial fibrillation and has been started on flecainide.  A 2 day stress myoview has been ordered and is underway.    Objective: Vital signs in last 24 hours: Temp:  [97.6 F (36.4 C)-99 F (37.2 C)] 98.2 F (36.8 C) (05/04 0647) Pulse Rate:  [54-76] 69 (05/04 0647) Resp:  [18-20] 18 (05/04 0647) BP: (118-133)/(64-81) 120/71 mmHg (05/04 0647) SpO2:  [93 %-98 %] 94 % (05/04 0647) Weight:  [296 lb 8.3 oz (134.5 kg)-304 lb 7.3 oz (138.1 kg)] 296 lb 8.3 oz (134.5 kg) (05/04 0643) Last BM Date: 08/02/13  Intake/Output from previous day: 05/03 0701 - 05/04 0700 In: 720 [P.O.:720] Out: 100 [Urine:100] Intake/Output this shift:    Medications Current Facility-Administered Medications  Medication Dose Route Frequency Provider Last Rate Last Dose  . apixaban (ELIQUIS) tablet 5 mg  5 mg Oral BID Delfina Redwood, MD   5 mg at 08/03/13 2143  . benzonatate (TESSALON) capsule 200 mg  200 mg Oral TID Delfina Redwood, MD   200 mg at 08/03/13 2143  . chlorpheniramine-HYDROcodone (TUSSIONEX) 10-8 MG/5ML suspension 5 mL  5 mL Oral Q12H PRN Rhetta Mura Schorr, NP   5 mL at 08/01/13 1809  . finasteride (PROSCAR) tablet 5 mg  5 mg Oral QHS Kelvin Cellar, MD   5 mg at 08/03/13 2143  . flecainide (TAMBOCOR) tablet 50 mg  50 mg Oral Q12H Sueanne Margarita, MD   50 mg at 08/03/13 2203  . fluticasone (FLONASE) 50 MCG/ACT nasal spray 2 spray  2 spray Each Nare Daily Delfina Redwood, MD   2 spray at 08/03/13 1152  . furosemide (LASIX) injection 40 mg  40 mg Intravenous Once Delfina Redwood, MD       . furosemide (LASIX) tablet 20 mg  20 mg Oral Daily Delfina Redwood, MD   20 mg at 08/03/13 1151  . loratadine (CLARITIN) tablet 10 mg  10 mg Oral Daily Delfina Redwood, MD   10 mg at 08/03/13 1151  . LORazepam (ATIVAN) tablet 0.5-1 mg  0.5-1 mg Oral BID Etta Quill, DO   1 mg at 08/03/13 2141  . metoprolol (LOPRESSOR) injection 5 mg  5 mg Intravenous Once Delfina Redwood, MD      . metoprolol tartrate (LOPRESSOR) tablet 12.5 mg  12.5 mg Oral BID Etta Quill, DO   12.5 mg at 08/03/13 2142  . multivitamin liquid 5 mL  5 mL Oral Daily Delfina Redwood, MD   5 mL at 08/03/13 1000  . ondansetron (ZOFRAN-ODT) disintegrating tablet 4 mg  4 mg Oral Q8H PRN Rhetta Mura Schorr, NP      . pantoprazole (PROTONIX) EC tablet 40 mg  40 mg Oral BID Delfina Redwood, MD   40 mg at 08/03/13 2141  . potassium chloride (K-DUR,KLOR-CON) CR tablet 10 mEq  10 mEq Oral TID Delfina Redwood, MD   10 mEq at 08/03/13 2144  . risperiDONE (RISPERDAL) tablet 0.5 mg  0.5 mg Oral QHS Etta Quill,  DO   0.5 mg at 08/03/13 2144  . sertraline (ZOLOFT) tablet 150 mg  150 mg Oral Q breakfast Etta Quill, DO   150 mg at 08/04/13 1610  . sodium chloride 0.9 % injection 3 mL  3 mL Intravenous Q12H Etta Quill, DO   3 mL at 08/03/13 2144    PE: General appearance: alert, cooperative and no distress Lungs: Decreased BS on the left.  No wheeze or rales Heart: regular rate and rhythm, S1, S2 normal, no murmur, click, rub or gallop Extremities: No LEE Pulses: 2+ and symmetric Skin: Warm and dry Neurologic: Grossly normal  Lab Results:   Recent Labs  08/02/13 0415 08/03/13 0815 08/04/13 0540  WBC 10.0 9.2 7.6  HGB 9.0* 10.4* 8.9*  HCT 28.2* 32.8* 28.9*  PLT 412* 430* 375   BMET  Recent Labs  08/02/13 0415 08/03/13 0815 08/04/13 0540  NA 143 145 143  K 3.5* 3.6* 3.9  CL 103 104 103  CO2 27 25 29   GLUCOSE 97 122* 89  BUN 15 15 16   CREATININE 1.09 0.98 0.97  CALCIUM 8.1* 8.5  8.2*    Assessment/Plan   Principal Problem:   1.  Upper GI bleed- recurrent  Active Problems:  2.   PAF (paroxysmal atrial fibrillation) with RVR   Maintaining sinus brady at 59.  Flecainide added yesterday.  2 day Lexiscan today.    Lopressor 12.5mg  BID  3.   Hypotension, unspecified  Resolved   4.  SLEEP APNEA-   on C-pap   5.  Pulmonary thromboembolism- 07/16/13    On Eliquis  6.   Morbid obesity    H/O laparoscopic adjustable gastric banding & hiatal hernia repair 04/08/13    7.  Pericardial effusion s/p pericardial window 07/18/13 400cc  Repeat echo, 4/28, Small circumferential pericardial effusion.   8.   Acute on chronic diastolic CHF - Appears compensated on PO Lasix   BNP improved from 2054.0>>977.7.  Worsening left pleural effusion.  IV lasix ordered x1 by primary.    LOS: 7 days    Tarri Fuller PA-C 08/04/2013 7:49 AM  Attending Note:   The patient was seen and examined.  Agree with assessment and plan as noted above.  Changes made to the above note as needed.  If the myoview is negative, he should be able to go home today.   He will follow up with Dr. Debara Pickett.    Thayer Headings, Brooke Bonito., MD, [facility] 08/04/2013, 10:56 AM

## 2013-08-04 NOTE — Progress Notes (Signed)
Discharge instructions, medications, and f/u appointments reviewed at bedside with patient and wife. All questions answered and patient and wife verbalized understanding. Social at bedside to review outpatient PT. IV d/cd dressing clean, dry, and intact. Tele off. Transport called to take patient out in wheelchair.  Clovis Riley, RN

## 2013-08-05 NOTE — ED Provider Notes (Signed)
Medical screening examination/treatment/procedure(s) were performed by non-physician practitioner and as supervising physician I was immediately available for consultation/collaboration.   EKG Interpretation   Date/Time:  Monday July 28 2013 19:36:59 EDT Ventricular Rate:  85 PR Interval:  138 QRS Duration: 90 QT Interval:  376 QTC Calculation: 447 R Axis:   75 Text Interpretation:  Normal sinus rhythm T wave abnormality, consider  inferior ischemia T wave abnormality, consider anterolateral ischemia  Abnormal ECG ED PHYSICIAN INTERPRETATION AVAILABLE IN [facility]  Confirmed by TEST, Record ([id number]) on 07/30/2013 7:14:50 AM       Threasa Beards, MD 08/05/13 [phone]

## 2013-08-06 ENCOUNTER — Encounter: Payer: Self-pay | Admitting: Internal Medicine

## 2013-08-06 NOTE — Progress Notes (Signed)
Quick Note:  Diminutive adenomas x 2 and lymphoid hyperplasia Repeat colonoscopy 2020 ______

## 2013-08-07 ENCOUNTER — Encounter (INDEPENDENT_AMBULATORY_CARE_PROVIDER_SITE_OTHER): Payer: Self-pay

## 2013-08-07 ENCOUNTER — Ambulatory Visit (INDEPENDENT_AMBULATORY_CARE_PROVIDER_SITE_OTHER): Payer: Medicare Other | Admitting: Physician Assistant

## 2013-08-07 VITALS — BP 142/82 | HR 78 | Temp 98.3°F | Ht 72.0 in | Wt 298.0 lb

## 2013-08-07 DIAGNOSIS — Z9884 Bariatric surgery status: Secondary | ICD-10-CM | POA: Diagnosis not present

## 2013-08-07 NOTE — Patient Instructions (Signed)
Return in one month. Focus on good food choices as well as physical activity. Return sooner if you have an increase in hunger, portion sizes or weight. Return also for difficulty swallowing, night cough, reflux.

## 2013-08-07 NOTE — Progress Notes (Signed)
  HISTORY: [name] is a 68 y.o.male who received an AP-Large lap-band in January 2015 by Dr. Redmond Pulling. He comes in with 13 lbs weight loss since his last visit one month ago. Unfortunately he's had quite a month. He was admitted for a pericardial effusion followed by a pericardial window, pleural effusion, CHF, upper GI bleed from gastric ulcer (Dx by EGD). He was discharged 3 days ago and this is his first outing out of the house. He reports significant decreased energy. He has no obstructive symptoms. He had been placed on a heart healthy diet but wants to return to his high-protein bariatric diet that he had success with prior to admission. He is scheduled for cardiac rehab in the coming week.  VITAL SIGNS: Filed Vitals:   08/07/13 1004  BP: 142/82  Pulse: 78  Temp: 98.3 F (36.8 C)    PHYSICAL EXAM: Physical exam reveals a very well-appearing 67 y.o.male in no apparent distress Neurologic: Awake, alert, oriented Psych: Bright affect, conversant Respiratory: Breathing even and unlabored. No stridor or wheezing Extremities: Atraumatic, good range of motion. Skin: Warm, Dry, no rashes Musculoskeletal: Normal gait, Joints normal  ASSESMENT: 68 y.o.  male  s/p AP-Large lap-band.   PLAN: With all that has gone on and his significant decrease in endurance, I recommended leaving his band as-is until he recovers more from his recent medical events. He and his wife concur. He is interested in beginning the BEL[facility] program, but I advised him to seek clearance from cardiology and medicine prior to beginning this, and certainly after he's graduated from cardiopulmonary rehab. He voiced understanding and agreement. We'll see him back in one month.

## 2013-08-08 ENCOUNTER — Ambulatory Visit (INDEPENDENT_AMBULATORY_CARE_PROVIDER_SITE_OTHER): Payer: Medicare Other | Admitting: Cardiology

## 2013-08-08 VITALS — BP 108/78 | HR 53 | Ht 72.0 in | Wt 299.0 lb

## 2013-08-08 DIAGNOSIS — I2699 Other pulmonary embolism without acute cor pulmonale: Secondary | ICD-10-CM

## 2013-08-08 DIAGNOSIS — Z79899 Other long term (current) drug therapy: Secondary | ICD-10-CM

## 2013-08-08 DIAGNOSIS — I3139 Other pericardial effusion (noninflammatory): Secondary | ICD-10-CM

## 2013-08-08 DIAGNOSIS — R079 Chest pain, unspecified: Secondary | ICD-10-CM

## 2013-08-08 DIAGNOSIS — I4891 Unspecified atrial fibrillation: Secondary | ICD-10-CM

## 2013-08-08 DIAGNOSIS — I480 Paroxysmal atrial fibrillation: Secondary | ICD-10-CM

## 2013-08-08 DIAGNOSIS — I313 Pericardial effusion (noninflammatory): Secondary | ICD-10-CM

## 2013-08-08 DIAGNOSIS — R5383 Other fatigue: Secondary | ICD-10-CM | POA: Diagnosis not present

## 2013-08-08 DIAGNOSIS — R5381 Other malaise: Secondary | ICD-10-CM | POA: Diagnosis not present

## 2013-08-08 DIAGNOSIS — K922 Gastrointestinal hemorrhage, unspecified: Secondary | ICD-10-CM

## 2013-08-08 DIAGNOSIS — I319 Disease of pericardium, unspecified: Secondary | ICD-10-CM

## 2013-08-08 MED ORDER — METOPROLOL TARTRATE 25 MG PO TABS: 12.5000 mg | ORAL_TABLET | Freq: Once | ORAL | Status: DC

## 2013-08-08 NOTE — Progress Notes (Signed)
Patient ID: [name], male   DOB: December 23, 1945, 68 y.o.   MRN: [id number]    08/08/2013 [name]   1945/07/07  [id number]  Primary Physicia [name], MD Primary Cardiologist: Dr. Debara Pickett   HPI:  [name] presents to clinic today for post hospital f/u. He is followed medically by Dr. [name]. He is a 68 y/o male with a history of bariatric surgery, one year ago, lost about 85 pounds before and after surgery. Also has a history of hypertension and sleep apnea.  He has been admitted twice in the last month with an unusual presentation with multiple medical problems, including pulmonary embolus, moderate to large pericardial effusion, new onset a-fib and GI bleed. His PE was diagnosed by his PCP through outpatient w/u and he was placed on Xarelto. His first hospital admission was from 4/15-4/24. At that time his CC was chest pain. W/u suggested a diagnosis of pericarditis. An Echocardiogram was done, which showed moderate to large pericardial effusion. Meanwhile the night of 4/16 he went in to afib with RVR, probably secondary to PE and was transferred to step down and started on cardizem drip. He converted to sinus the same night , cardizem discontinued and he was resumed on metoprolol. CT surgery was consulted for pericardial window. He underwent pericardial window on 4/17 and hemorrhagic fluid was drained and was sent for analysis. Gram stain and cultures were negative.  He was also started on colchicine for his pericarditis.  The evening of 4/20 pt went back in to afib with RVR and had bloody bowel movement. His xarelto was discontinued and he was put on cardizem gtt. Over the next 24 hours his hemoglobin dropped to 7.8 and he received 2 units of prbc transfusion. His repeat H&H improved. Dr Henrene Pastor was consulted from GI on 4/20 and he underwent endoscopy on 4/21 . He was found to have superficial ulcers in the stomach, underwent biopsy of the area. He was recommended to continue on PPI  twice daily for 2 weeks and resume xarelto.   He was discharged home then returned 3 days later on 07/28/13 with a complaint of increased fatigue and SOB. Repeat 2D echo demonstrated only minimal residual effusion with normal RV and LV function. 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 He was admitted and underwent a repeat EGD which showed healing of ulcers, no bleed. He was continued on PO iron and a PPI. His Hgb at time of discharge was stable at 9.0. He was discharged home. Cardiology had recommended an OP stress test. This was done 08/03/13 and showed normal myocardial perfusion and normal LV EF of 58%.  He presents back to clinic today for f/u. He is accompanied by his wife. Considering all that he has been through, he states that he has been doing fairly well. He continues to have mild fatigue, but denies any melena or hematochezia. He has been fully compliant with his medications. He denies any recurrent chest pain, shortness of breath, orthopnea, PND, dizziness, syncope/syncope. He is scheduled to followup with his PC,P Dr. [name], next week to have repeat blood work.    Current Outpatient Prescriptions  Medication Sig Dispense Refill  . apixaban (ELIQUIS) 5 MG TABS tablet Take 1 tablet (5 mg total) by mouth 2 (two) times daily.  60 tablet  0  . Cyanocobalamin (VITAMIN B-12) 1000 MCG SUBL Place 1,000 mcg under the tongue every morning.      [name] Dextromethorphan-Guaifenesin (ROBITUSSIN DM PO) Take  15 mLs by mouth daily as needed (cough).      . docusate sodium 100 MG CAPS Take 100 mg by mouth 2 (two) times daily.  10 capsule  0  . finasteride (PROSCAR) 5 MG tablet Take 5 mg by mouth daily.       . flecainide (TAMBOCOR) 50 MG tablet Take 1 tablet (50 mg total) by mouth every 12 (twelve) hours.  60 tablet  0  . fluticasone (FLONASE) 50 MCG/ACT nasal spray Place 2 sprays into both nostrils daily.  16 g  2  . furosemide (LASIX) 20 MG tablet Take 1 tablet (20 mg total) by mouth daily.   30 tablet  1  . loratadine (CLARITIN) 10 MG tablet Take 1 tablet (10 mg total) by mouth daily.  30 tablet  0  . LORazepam (ATIVAN) 0.5 MG tablet Take 0.5-1 mg by mouth 2 (two) times daily. 0.5 mg in the morning and 1 mg at bedtime      . metoprolol tartrate (LOPRESSOR) 25 MG tablet Take 0.5 tablets (12.5 mg total) by mouth once.  30 tablet  5  . Multiple Vitamin (MULTIVITAMIN) tablet Take 1 tablet by mouth daily. Chewable tablet with iron      . ondansetron (ZOFRAN-ODT) 4 MG disintegrating tablet Take 1 tablet (4 mg total) by mouth every 8 (eight) hours as needed for nausea or vomiting.  20 tablet  0  . pantoprazole (PROTONIX) 40 MG tablet Take 1 tablet (40 mg total) by mouth 2 (two) times daily.  60 tablet  1  . polyethylene glycol (MIRALAX / GLYCOLAX) packet Take 17 g by mouth daily.  14 each  0  . potassium chloride (K-DUR,KLOR-CON) 10 MEQ tablet Take 1 tablet (10 mEq total) by mouth 3 (three) times daily.  90 tablet  0  . risperiDONE (RISPERDAL) 0.5 MG tablet Take 1 tablet (0.5 mg total) by mouth at bedtime.  30 tablet  2  . sertraline (ZOLOFT) 100 MG tablet Take 1.5 tablets (150 mg total) by mouth daily with breakfast.  45 tablet  2  . Tetrahydrozoline HCl (VISINE OP) Place 2 drops into both eyes daily as needed (dry eyes).      . benzonatate (TESSALON) 200 MG capsule Take 1 capsule (200 mg total) by mouth 3 (three) times daily as needed for cough.  20 capsule  0  . benzonatate (TESSALON) 200 MG capsule Take 200 mg by mouth 3 (three) times daily as needed for cough.      . budesonide-formoterol (SYMBICORT) 160-4.5 MCG/ACT inhaler Inhale 2 puffs into the lungs 2 (two) times daily.  1 Inhaler  3  . montelukast (SINGULAIR) 10 MG tablet Take 1 tablet (10 mg total) by mouth at bedtime.  30 tablet  3   No current facility-administered medications for this visit.    Allergies  Allergen Reactions  . Propoxyphene N-Acetaminophen Nausea Only  . Codeine     nausea  . Morphine     Nausea Severe  hallucinations  . Penicillins     LOC with shot    History   Social History  . Marital Status: Married    Spouse Name: N/A    Number of Children: N/A  . Years of Education: N/A   Occupational History  . Not on file.   Social History Main Topics  . Smoking status: Former Smoker    Types: Cigarettes    Quit date: 08/30/1974  . Smokeless tobacco: Not on file  . Alcohol Use: Yes  Comment: Occasionally; 1 small drink a week  . Drug Use: No  . Sexual Activity: Not on file   Other Topics Concern  . Not on file   Social History Narrative  . No narrative on file     Review of Systems: General: negative for chills, fever, night sweats or weight changes.  Cardiovascular: negative for chest pain, dyspnea on exertion, edema, orthopnea, palpitations, paroxysmal nocturnal dyspnea or shortness of breath Dermatological: negative for rash Respiratory: negative for cough or wheezing Urologic: negative for hematuria Abdominal: negative for nausea, vomiting, diarrhea, bright red blood per rectum, melena, or hematemesis Neurologic: negative for visual changes, syncope, or dizziness All other systems reviewed and are otherwise negative except as noted above.    Blood pressure 108/78, pulse 53, height 6' (1.829 m), weight 299 lb (135.626 kg).  General appearance: alert, cooperative and no distress Neck: no carotid bruit and no JVD Lungs: clear to auscultation bilaterally Heart: regular rate and rhythm, S1, S2 normal, no murmur, click, rub or gallop Extremities: trace bilateral LEE Pulses: 2+ and symmetric Skin: warm and dry Neurologic: Grossly normal  EKG NSR 53 bpm  ASSESSMENT AND PLAN:   Pulmonary thromboembolism- 07/16/13 Denies recurrent chest pain and shortness of breath. Continue oral anticoagulation. His Xarelto was changed to Eliquis due to insurance.   Pericarditis Denies recurrent chest pain and shortness of breath. Continue colchicine.  Pericardial effusion s/p  pericardial window 07/18/13 400cc Denies shortness of breath, syncope/near-syncope. Followup 2-D echo, post pericardial window, showed no recurrent effusion. Continue treatment of pericarditis with colchicine.  PAF (paroxysmal atrial fibrillation) with RVR Denies palpitations. His EKG today demonstrates sinus rhythm with a heart rate of 53 beats per minute. Continue beta blocker for rate control. He has been on 25 mg of Lopressor twice a day. However, due current HR of 53, in the setting of fatigue as well as significant nocturnal bradycardia while in the hospital (with heart rates in the 30s), we will decrease his Lopressor to 12.5 mg twice a day.  Upper GI bleed- recurrent Denies melena. Repeat EGD demonstrated healing ulcers without bleeding. Continue PPI and daily iron.  Chest pain Denies any recurrent chest pain. Recent outpatient nuclear stress test demonstrated normal perfusion and normal LV function with an estimated EF of 58%.  PLAN  Reduce Lopressor to 12.5 mg once daily (HR currently at 53 bpm and dropped in the 30s in the hospital significant nocturnal bradycardia. He is scheduled to see Dr. Huey Bienenstock on Monday. I recommended repeat CBC and CMP. F/u with Dr. Debara Pickett in 4-6 weeks.    Brittainy SimmonsPA-C 08/08/2013 1:33 PM

## 2013-08-08 NOTE — Telephone Encounter (Signed)
Closed encounter °

## 2013-08-08 NOTE — Patient Instructions (Signed)
Your physician recommends that you schedule a follow-up appointment in:  4 to 6 weeks with Dr. Debara Pickett  Decrease your Metoprolol to one time a day  Have your labs  done at the time you see Dr. [name]

## 2013-08-11 ENCOUNTER — Other Ambulatory Visit: Payer: Self-pay | Admitting: Cardiothoracic Surgery

## 2013-08-11 ENCOUNTER — Other Ambulatory Visit (INDEPENDENT_AMBULATORY_CARE_PROVIDER_SITE_OTHER): Payer: Medicare Other

## 2013-08-11 ENCOUNTER — Ambulatory Visit (INDEPENDENT_AMBULATORY_CARE_PROVIDER_SITE_OTHER): Payer: Medicare Other | Admitting: Internal Medicine

## 2013-08-11 ENCOUNTER — Encounter: Payer: Self-pay | Admitting: Internal Medicine

## 2013-08-11 VITALS — BP 124/74 | HR 69 | Temp 98.5°F | Resp 15 | Wt 295.2 lb

## 2013-08-11 DIAGNOSIS — I10 Essential (primary) hypertension: Secondary | ICD-10-CM

## 2013-08-11 DIAGNOSIS — R7402 Elevation of levels of lactic acid dehydrogenase (LDH): Secondary | ICD-10-CM | POA: Diagnosis not present

## 2013-08-11 DIAGNOSIS — I2699 Other pulmonary embolism without acute cor pulmonale: Secondary | ICD-10-CM

## 2013-08-11 DIAGNOSIS — D62 Acute posthemorrhagic anemia: Secondary | ICD-10-CM

## 2013-08-11 DIAGNOSIS — R7401 Elevation of levels of liver transaminase levels: Secondary | ICD-10-CM | POA: Diagnosis not present

## 2013-08-11 DIAGNOSIS — R740 Nonspecific elevation of levels of transaminase and lactic acid dehydrogenase [LDH]: Secondary | ICD-10-CM

## 2013-08-11 LAB — CBC WITH DIFFERENTIAL/PLATELET
BASOS PCT: 0.4 % (ref 0.0–3.0)
Basophils Absolute: 0 10*3/uL (ref 0.0–0.1)
EOS PCT: 1.2 % (ref 0.0–5.0)
Eosinophils Absolute: 0.1 10*3/uL (ref 0.0–0.7)
HCT: 36.5 % — ABNORMAL LOW (ref 39.0–52.0)
HEMOGLOBIN: 11.8 g/dL — AB (ref 13.0–17.0)
LYMPHS ABS: 1.5 10*3/uL (ref 0.7–4.0)
Lymphocytes Relative: 15.5 % (ref 12.0–46.0)
MCHC: 32.4 g/dL (ref 30.0–36.0)
MCV: 86.3 fl (ref 78.0–100.0)
MONO ABS: 0.8 10*3/uL (ref 0.1–1.0)
Monocytes Relative: 8 % (ref 3.0–12.0)
Neutro Abs: 7.3 10*3/uL (ref 1.4–7.7)
Neutrophils Relative %: 74.9 % (ref 43.0–77.0)
Platelets: 310 10*3/uL (ref 150.0–400.0)
RBC: 4.23 Mil/uL (ref 4.22–5.81)
RDW: 16.7 % — AB (ref 11.5–15.5)
WBC: 9.7 10*3/uL (ref 4.0–10.5)

## 2013-08-11 LAB — BASIC METABOLIC PANEL
BUN: 20 mg/dL (ref 6–23)
CALCIUM: 8.7 mg/dL (ref 8.4–10.5)
CO2: 30 meq/L (ref 19–32)
CREATININE: 1.1 mg/dL (ref 0.4–1.5)
Chloride: 104 mEq/L (ref 96–112)
GFR: 74.74 mL/min (ref >60.00)
Glucose, Bld: 95 mg/dL (ref 70–99)
Potassium: 4.1 mEq/L (ref 3.5–5.1)
Sodium: 141 mEq/L (ref 135–145)

## 2013-08-11 LAB — HEPATIC FUNCTION PANEL
ALT: 25 U/L (ref 0–53)
AST: 20 U/L (ref 0–37)
Albumin: 3 g/dL — ABNORMAL LOW (ref 3.5–5.2)
Alkaline Phosphatase: 160 U/L — ABNORMAL HIGH (ref 39–117)
BILIRUBIN DIRECT: 0.1 mg/dL (ref 0.0–0.3)
TOTAL PROTEIN: 6.3 g/dL (ref 6.0–8.3)
Total Bilirubin: 0.7 mg/dL (ref 0.2–1.2)

## 2013-08-11 NOTE — Progress Notes (Signed)
   Subjective:    Patient ID: [name], male    DOB: 1945/11/08, 68 y.o.   MRN: [id number]  HPI   His complicated hospital course 4/27-08/04/13 was reviewed. He had a recurrent upper GI bleed for which an additional 2 units of packed cells was required.  His Xarelto was changed to Eliquis as per insurance coverage  He has been seen in followup 5/8 by his cardiologist and orders entered for comprehensive metabolic profile and CBC and differential.   Review of Systems   He has started exercising as walking near his home. This does result in some exertional dyspnea as well as cough  He denies associated chest pain or palpitations.  He denies unexplained weight loss, abdominal pain, significant dyspepsia, melena, or rectal bleeding. Dyphagia only with excess food in context of Bariatric surgery     Objective:   Physical Exam General appearance is one of good health and nourishment w/o distress. Weight excess   Eyes: No conjunctival inflammation or scleral icterus is present.  Oral exam: Dental hygiene is good; lips and gums are healthy appearing.There is no oropharyngeal erythema or exudate noted.   Heart:  Normal rate and regular rhythm. S1 and S2 normal without gallop, murmur, click, rub or other extra sounds     Lungs:Chest clear to auscultation; no wheezes, rhonchi,rales ,or rubs present.No increased work of breathing.   Abdomen: Protuberant ;bowel sounds normal, soft and non-tender without masses, organomegaly or hernias noted.  No guarding or rebound . No tenderness over the flanks to percussion  Musculoskeletal: Able to lie flat and sit up without help. Negative straight leg raising bilaterally. Gait normal  Skin:Warm & dry.  Intact without suspicious lesions or rashes ; no jaundice or tenting  Lymphatic: No lymphadenopathy is noted about the head, neck, axilla             Assessment & Plan:  #1 S/P GI bleed. No clinical suggestion of recurrence despite  novel oral anticoagulation #2 HTN &  Hypotension resolved See orders

## 2013-08-11 NOTE — Progress Notes (Signed)
Pre visit review using our clinic review tool, if applicable. No additional management support is needed unless otherwise documented below in the visit note. 

## 2013-08-11 NOTE — Patient Instructions (Signed)
Your next office appointment will be determined based upon review of your pending labs. Those instructions will be transmitted to you through My Chart . 

## 2013-08-13 ENCOUNTER — Telehealth: Payer: Self-pay

## 2013-08-13 NOTE — Telephone Encounter (Signed)
I spoke to patient's wife letting her know about taking Protonix twice daily. She states he already is. She is requesting Tessalon be refilled for him

## 2013-08-13 NOTE — Telephone Encounter (Signed)
Phone call from patient's son. He would like to know what his father is to do regarding his constant coughing. Were you going to refer him somewhere?

## 2013-08-13 NOTE — Telephone Encounter (Signed)
OV if Protonix twice a day does not resolve cough As reflux of gastric acid may be asymptomatic as this may occur mainly during sleep.The triggers for reflux  include stress; the "aspirin family" ; alcohol; peppermint; and caffeine (coffee, tea, cola, and chocolate). Food & drink should be avoided for @ least 2 hours before going to bed.

## 2013-08-13 NOTE — Telephone Encounter (Signed)
Tessalon 200  1 tid prn OK #21

## 2013-08-13 NOTE — Telephone Encounter (Signed)
Patient's son has been advised.

## 2013-08-14 ENCOUNTER — Encounter: Payer: Self-pay | Admitting: Cardiothoracic Surgery

## 2013-08-14 ENCOUNTER — Ambulatory Visit (INDEPENDENT_AMBULATORY_CARE_PROVIDER_SITE_OTHER): Payer: Self-pay | Admitting: Cardiothoracic Surgery

## 2013-08-14 ENCOUNTER — Ambulatory Visit
Admission: RE | Admit: 2013-08-14 | Discharge: 2013-08-14 | Disposition: A | Discharge status: Home / Self Care | Payer: Medicare Other | Source: Ambulatory Visit | Attending: Cardiothoracic Surgery | Admitting: Cardiothoracic Surgery

## 2013-08-14 VITALS — BP 107/68 | HR 80 | Resp 20 | Ht 72.0 in | Wt 292.0 lb

## 2013-08-14 DIAGNOSIS — I2699 Other pulmonary embolism without acute cor pulmonale: Secondary | ICD-10-CM

## 2013-08-14 DIAGNOSIS — J90 Pleural effusion, not elsewhere classified: Secondary | ICD-10-CM | POA: Diagnosis not present

## 2013-08-14 DIAGNOSIS — I3139 Other pericardial effusion (noninflammatory): Secondary | ICD-10-CM

## 2013-08-14 DIAGNOSIS — Z09 Encounter for follow-up examination after completed treatment for conditions other than malignant neoplasm: Secondary | ICD-10-CM

## 2013-08-14 DIAGNOSIS — I319 Disease of pericardium, unspecified: Secondary | ICD-10-CM

## 2013-08-14 DIAGNOSIS — I313 Pericardial effusion (noninflammatory): Secondary | ICD-10-CM

## 2013-08-14 LAB — FUNGUS CULTURE W SMEAR
Fungal Smear: NONE SEEN
Fungal Smear: NONE SEEN

## 2013-08-14 NOTE — Progress Notes (Signed)
[address]       [place] [address]             [phone]      Jaylan A Hebb [facility] Medical Record #[id number] Date of Birth: July 01, 1945  Referring: Pixie Casino., MD Primary Care: [name], MD  Chief Complaint:   POST OP FOLLOW UP 07/18/2013  OPERATIVE REPORT  PREOPERATIVE DIAGNOSIS: Pericardial effusion.  POSTOPERATIVE DIAGNOSIS: Pericardial effusion with evidence of diffuse  pericarditis.  PROCEDURE PERFORMED: Subxiphoid pericardial window and drainage of  pericardial effusion.  SURGEON: Lanelle Bal, MD  History of Present Illness:     Since initial drainage of the pericardial effusion, which at the time of surgery really was severe pericarditis with thickened pericardium then a large free-flowing pericardial effusion, the patient has been readmitted to the hospital with a recurrent GI bleed. He notes since his hemoglobin has improved he spelled much better. He's increasing his physical activity to some degree but is still very limited and fatigues quickly.     Past Medical History  Diagnosis Date  . Benign prostatic hypertrophy     several prostate biopsies neg for cancer.   . Hypertension   . Gout   . Depression   . FH: colonic polyps 2010  . Morbid obesity   . Complication of anesthesia     MORPHINE CAUSES HALLUCINATIONS  . Asthma     childhood asthma  . Numbness     feet / legs  . History of kidney stones   . Kidney cysts   . Sleep apnea     USES C PAP  . Bilateral hip pain   . PAF (paroxysmal atrial fibrillation) with RVR 07/29/2013  . Anemia 07/29/2013     History  Smoking status  . Former Smoker  . Types: Cigarettes  . Quit date: 08/30/1974  Smokeless tobacco  . Not on file    History  Alcohol Use  . Yes    Comment: Occasionally; 1 small drink a week     Allergies  Allergen Reactions  . Propoxyphene N-Acetaminophen Nausea Only  . Codeine     nausea  . Morphine     Nausea Severe  hallucinations  . Penicillins     LOC with shot    Current Outpatient Prescriptions  Medication Sig Dispense Refill  . apixaban (ELIQUIS) 5 MG TABS tablet Take 1 tablet (5 mg total) by mouth 2 (two) times daily.  60 tablet  0  . benzonatate (TESSALON) 200 MG capsule Take 200 mg by mouth 3 (three) times daily as needed for cough.      . Cyanocobalamin (VITAMIN B-12) 1000 MCG SUBL Place 1,000 mcg under the tongue every morning.      [name] Dextromethorphan-Guaifenesin (ROBITUSSIN DM PO) Take 15 mLs by mouth daily as needed (cough).      . docusate sodium 100 MG CAPS Take 100 mg by mouth 2 (two) times daily.  10 capsule  0  . finasteride (PROSCAR) 5 MG tablet Take 5 mg by mouth daily.       . flecainide (TAMBOCOR) 50 MG tablet Take 1 tablet (50 mg total) by mouth every 12 (twelve) hours.  60 tablet  0  . fluticasone (FLONASE) 50 MCG/ACT nasal spray Place 2 sprays into both nostrils daily.  16 g  2  . furosemide (LASIX) 20 MG tablet Take 1 tablet (20 mg total) by mouth daily.  30 tablet  1  . loratadine (CLARITIN)  10 MG tablet Take 1 tablet (10 mg total) by mouth daily.  30 tablet  0  . LORazepam (ATIVAN) 0.5 MG tablet Take 0.5-1 mg by mouth 2 (two) times daily. 0.5 mg in the morning and 1 mg at bedtime      . metoprolol tartrate (LOPRESSOR) 25 MG tablet Take 0.5 tablets (12.5 mg total) by mouth once.  30 tablet  5  . Multiple Vitamin (MULTIVITAMIN) tablet Take 1 tablet by mouth daily. Chewable tablet with iron      . ondansetron (ZOFRAN-ODT) 4 MG disintegrating tablet Take 1 tablet (4 mg total) by mouth every 8 (eight) hours as needed for nausea or vomiting.  20 tablet  0  . pantoprazole (PROTONIX) 40 MG tablet Take 1 tablet (40 mg total) by mouth 2 (two) times daily.  60 tablet  1  . polyethylene glycol (MIRALAX / GLYCOLAX) packet Take 17 g by mouth daily.  14 each  0  . potassium chloride (K-DUR,KLOR-CON) 10 MEQ tablet Take 1 tablet (10 mEq total) by mouth 3 (three) times daily.  90 tablet  0  .  risperiDONE (RISPERDAL) 0.5 MG tablet Take 1 tablet (0.5 mg total) by mouth at bedtime.  30 tablet  2  . sertraline (ZOLOFT) 100 MG tablet Take 1.5 tablets (150 mg total) by mouth daily with breakfast.  45 tablet  2  . Tetrahydrozoline HCl (VISINE OP) Place 2 drops into both eyes daily as needed (dry eyes).       No current facility-administered medications for this visit.       Physical Exam: BP 107/68  Pulse 80  Resp 20  Ht 6' (1.829 m)  Wt 292 lb (132.45 kg)  BMI 39.59 kg/m2  SpO2 97%  General appearance: alert, cooperative, appears older than stated age and no distress Neurologic: intact Heart: regular rate and rhythm, S1, S2 normal, no murmur, click, rub or gallop Lungs: diminished breath sounds LLL Abdomen: soft, non-tender; bowel sounds normal; no masses,  no organomegaly Extremities: edema Bilateral lower strandy edema, improved from when the patient was seen in the hospital Wound: Subxiphoid pericardial window is well-healed without infection or drainage   Diagnostic Studies & Laboratory data:     Recent Radiology Findings:   Dg Chest 2 View  08/14/2013   CLINICAL DATA:  Status post pericardial window, shortness of breath, followup  EXAM: CHEST  2 VIEW  COMPARISON:  Chest x-ray of 08/03/2013  FINDINGS: The small left pleural effusion has diminished in volume with left basilar atelectasis remaining. The right lung is clear. Mild cardiomegaly is stable. There are degenerative changes in the mid to lower thoracic spine.  IMPRESSION: Decrease in volume of small left pleural effusion.   Electronically Signed   By: Ivar Drape M.D.   On: 08/14/2013 12:18      Recent Lab Findings: Lab Results  Component Value Date   WBC 9.7 08/11/2013   HGB 11.8* 08/11/2013   HCT 36.5* 08/11/2013   PLT 310.0 08/11/2013   GLUCOSE 95 08/11/2013   CHOL 138 12/05/2012   TRIG 137 12/05/2012   HDL 34* 12/05/2012   LDLDIRECT 111.6 02/15/2012   LDLCALC 77 12/05/2012   ALT 25 08/11/2013   AST 20  08/11/2013   NA 141 08/11/2013   K 4.1 08/11/2013   CL 104 08/11/2013   CREATININE 1.1 08/11/2013   BUN 20 08/11/2013   CO2 30 08/11/2013   TSH 1.731 12/05/2012   HGBA1C 6.1* 12/05/2012      Assessment /  Plan:    Currently stable after pericardial window with pericardial biopsy, negative for tumor and culture negative blood with significant pericarditis Recent diagnosis of pulmonary embolus prior to subxiphoid pericardial window  Small left pleural effusion- up plan to see the patient back in 3 weeks with a followup chest x-ray    Grace Isaac MD      [address] [place] [address] Office [phone]   Beeper [phone]  08/14/2013 12:39 PM

## 2013-08-14 NOTE — Telephone Encounter (Signed)
Called in the tesselon 200 per MD approval.  Pt informed

## 2013-08-15 ENCOUNTER — Telehealth: Payer: Self-pay | Admitting: Internal Medicine

## 2013-08-15 ENCOUNTER — Ambulatory Visit: Payer: Medicare Other | Attending: Internal Medicine

## 2013-08-15 ENCOUNTER — Encounter: Payer: Self-pay | Admitting: Internal Medicine

## 2013-08-15 ENCOUNTER — Ambulatory Visit (INDEPENDENT_AMBULATORY_CARE_PROVIDER_SITE_OTHER): Payer: Medicare Other | Admitting: Internal Medicine

## 2013-08-15 ENCOUNTER — Other Ambulatory Visit: Payer: Self-pay | Admitting: Internal Medicine

## 2013-08-15 VITALS — BP 110/62 | HR 84 | Temp 99.9°F | Resp 15 | Wt 295.0 lb

## 2013-08-15 DIAGNOSIS — R5381 Other malaise: Secondary | ICD-10-CM | POA: Insufficient documentation

## 2013-08-15 DIAGNOSIS — R05 Cough: Secondary | ICD-10-CM | POA: Diagnosis not present

## 2013-08-15 DIAGNOSIS — R059 Cough, unspecified: Secondary | ICD-10-CM

## 2013-08-15 DIAGNOSIS — J310 Chronic rhinitis: Secondary | ICD-10-CM

## 2013-08-15 DIAGNOSIS — IMO0001 Reserved for inherently not codable concepts without codable children: Secondary | ICD-10-CM | POA: Insufficient documentation

## 2013-08-15 DIAGNOSIS — R279 Unspecified lack of coordination: Secondary | ICD-10-CM | POA: Diagnosis not present

## 2013-08-15 MED ORDER — BUDESONIDE-FORMOTEROL FUMARATE 160-4.5 MCG/ACT IN AERO: 2.0000 | INHALATION_SPRAY | Freq: Two times a day (BID) | RESPIRATORY_TRACT | Status: DC

## 2013-08-15 MED ORDER — MONTELUKAST SODIUM 10 MG PO TABS: 10.0000 mg | ORAL_TABLET | Freq: Every day | ORAL | Status: DC

## 2013-08-15 NOTE — Progress Notes (Signed)
Pre visit review using our clinic review tool, if applicable. No additional management support is needed unless otherwise documented below in the visit note. 

## 2013-08-15 NOTE — Patient Instructions (Signed)
Plain Mucinex (NOT D) for thick secretions ;force NON dairy fluids .   Nasal cleansing in the shower as discussed with lather of mild shampoo.After 10 seconds wash off lather while  exhaling through nostrils. Make sure that all residual soap is removed to prevent irritation.  Flonase OR Nasacort AQ 1 spray in each nostril twice a day as needed. Use the "crossover" technique into opposite nostril spraying toward opposite ear @ 45 degree angle, not straight up into nostril.  Use a Neti pot daily only  as needed for significant sinus congestion; going from open side to congested side . Plain Allegra (NOT D )  160 daily , Loratidine 10 mg , OR Zyrtec 10 mg @ bedtime  as needed for itchy eyes & sneezing. Symbicort  one - two inhalations every 12 hours; gargle and spit after use

## 2013-08-15 NOTE — Telephone Encounter (Signed)
Neuro Rehab department is calling to request that Dr. [name] put in a referral for the patient to be seen at their office. Referral that is in the system now was put in by the hospital and they are wanting one to be put in by his PCP. Please advise.

## 2013-08-15 NOTE — Progress Notes (Signed)
   Subjective:    Patient ID: [name], male    DOB: [birth year], 68 y.o.   MRN: [id number]  HPI    He continues to have paroxysmal cough which is made worse with exercise. He relates this to postnasal drainage. Sitting upright with decrease in drainage is of benefit. All secretions have been clear, not purulent.  He's used Tessalon perles, Flonase, Claritin, Robitussin-DM without significant response  He's had low grade fever. He's also had some wheezing and shortness of breath. The cough was particularly bad today after walking in physical therapy  His chest x-ray 08/14/13 was reviewed;it reveals cardiomegaly and a left effusion. There is no infiltrate. The effusion is decreasing as per serial films.  He has a history of childhood asthma but no reactive airways disease as adult  His son has history of asthma, mainly exercise-induced bronchospasm    Review of Systems   He denies persistent frontal headache, facial pain, nasal purulence. Again all nasal secretions are clear  His no otic pain or otic discharge.  All sputum has been clear as well.     Objective:   Physical Exam General appearance:well nourished; no acute distress or increased work of breathing is present. Obese. No  lymphadenopathy about the head, neck, or axilla noted.   Eyes: No conjunctival inflammation or lid edema is present. There is no scleral icterus.  Ears:  External ear exam shows no significant lesions or deformities.  Otoscopic examination reveals clear canals, tympanic membranes are intact bilaterally without bulging, retraction, inflammation or discharge.Hearing aids bilaterally  Nose:  External nasal examination shows no deformity or inflammation. Nasal mucosa are pink and moist without lesions or exudates. No septal dislocation or deviation.No obstruction to airflow.   Oral exam: Dental hygiene is good; lips and gums are healthy appearing.There is no oropharyngeal erythema or exudate noted.     Neck:  No deformities, thyromegaly, masses, or tenderness noted.     Heart:  Normal rate and regular rhythm. S1 and S2 normal without gallop, murmur, click, rub or other extra sounds.   Lungs:Chest clear to auscultation; no wheezes, rhonchi,rales ,or rubs present.No increased work of breathing. Intermittent dry cough   Extremities:  No cyanosis, edema, or clubbing  noted    Skin: Warm & dry w/o jaundice or tenting.         Assessment & Plan:  #1 cough; ? Asthma variant from recent serious viral infection #2 rhinitis See orders

## 2013-08-18 NOTE — Telephone Encounter (Signed)
What facility ;what is diagnosis? I need as much info as possible for correct entry into Epic

## 2013-08-19 ENCOUNTER — Ambulatory Visit: Payer: Medicare Other

## 2013-08-19 MED ORDER — BENZONATATE 200 MG PO CAPS: 200.0000 mg | ORAL_CAPSULE | Freq: Three times a day (TID) | ORAL | Status: DC | PRN

## 2013-08-20 ENCOUNTER — Encounter: Payer: Self-pay | Admitting: Cardiology

## 2013-08-20 DIAGNOSIS — R079 Chest pain, unspecified: Secondary | ICD-10-CM | POA: Insufficient documentation

## 2013-08-24 ENCOUNTER — Encounter: Payer: Self-pay | Admitting: Internal Medicine

## 2013-08-26 MED ORDER — FLECAINIDE ACETATE 50 MG PO TABS: 50.0000 mg | ORAL_TABLET | Freq: Two times a day (BID) | ORAL | Status: DC

## 2013-08-26 MED ORDER — APIXABAN 5 MG PO TABS: 5.0000 mg | ORAL_TABLET | Freq: Two times a day (BID) | ORAL | Status: DC

## 2013-08-26 MED ORDER — POTASSIUM CHLORIDE CRYS ER 10 MEQ PO TBCR: 10.0000 meq | EXTENDED_RELEASE_TABLET | Freq: Three times a day (TID) | ORAL | Status: DC

## 2013-08-26 MED ORDER — LORATADINE 10 MG PO TABS: 10.0000 mg | ORAL_TABLET | Freq: Every day | ORAL | Status: DC

## 2013-08-31 LAB — AFB CULTURE WITH SMEAR (NOT AT ARMC): Acid Fast Smear: NONE SEEN

## 2013-09-03 ENCOUNTER — Ambulatory Visit (INDEPENDENT_AMBULATORY_CARE_PROVIDER_SITE_OTHER): Payer: Medicare Other | Admitting: Psychiatry

## 2013-09-03 ENCOUNTER — Encounter (HOSPITAL_COMMUNITY): Payer: Self-pay | Admitting: Psychiatry

## 2013-09-03 VITALS — BP 126/67 | HR 63 | Ht 72.0 in | Wt 294.6 lb

## 2013-09-03 DIAGNOSIS — F329 Major depressive disorder, single episode, unspecified: Secondary | ICD-10-CM

## 2013-09-03 DIAGNOSIS — F339 Major depressive disorder, recurrent, unspecified: Secondary | ICD-10-CM

## 2013-09-03 MED ORDER — LORAZEPAM 0.5 MG PO TABS: ORAL_TABLET | ORAL | Status: DC

## 2013-09-03 MED ORDER — SERTRALINE HCL 100 MG PO TABS: 150.0000 mg | ORAL_TABLET | Freq: Every day | ORAL | Status: DC

## 2013-09-03 MED ORDER — RISPERIDONE 0.5 MG PO TABS: 0.5000 mg | ORAL_TABLET | Freq: Every day | ORAL | Status: DC

## 2013-09-03 NOTE — Progress Notes (Signed)
[place] Progress Note  [name] [id number] 68 y.o.  09/03/2013 11:42 AM  Chief Complaint:  I have been very sick.      History of Present Illness:  [name] came for his followup appointment.  He has been very sick in the past few months.  He was admitted twice on a medical floor because of fatigue, flulike symptoms .  Patient told that he was diagnosed with pericarditis, A. fib, increased liver function and he has gone through a lot of stress.  His medications has been changed.  However he is is still taking his psychotropic medication.  He is no longer taking cholesterol-lowering agent because of increased liver enzyme.  He has pericardial window .  He admitted lack of energy, extreme fatigue and tiredness.  However he denies any depressive thoughts are crying spells.  He has been taking his Risperdal , Ativan and Zoloft.  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  Patient denies any side effects of medication.  I reviewed his discharge summary, blood results .  His liver enzymes is getting better .  Patient does still have social isolation, withdrawn which could be due to chronic health issues.  However he denies any agitation, anger, mood swing.  He sleeping on and off.  He wants to continue his current psychotropic medication.    Suicidal Ideation: No Plan Formed: No Patient has means to carry out plan: No  Homicidal Ideation: No Plan Formed: No Patient has means to carry out plan: No  Review of Systems: Psychiatric: Agitation: No Hallucination: No Depressed Mood: No Insomnia: No Hypersomnia: No Altered Concentration: No Feels Worthless: No Grandiose Ideas: No Belief In Special Powers: No New/Increased Substance Abuse: No Compulsions: No  Neurologic: Headache: No Seizure: No Paresthesias: No  Past Medical History  Diagnosis Date  . Benign prostatic hypertrophy     several prostate biopsies neg for  cancer.   . Hypertension   . Gout   . Depression   . FH: colonic polyps 2010  . Morbid obesity   . Complication of anesthesia     MORPHINE CAUSES HALLUCINATIONS  . Asthma     childhood asthma  . Numbness     feet / legs  . History of kidney stones   . Kidney cysts   . Sleep apnea     USES C PAP  . Bilateral hip pain   . PAF (paroxysmal atrial fibrillation) with RVR 07/29/2013  . Anemia 07/29/2013  Patient see Dr. [name].  Psychosocial History: Patient lives with his wife.  His wife is very supportive.  Outpatient Encounter Prescriptions as of 09/03/2013  Medication Sig  . apixaban (ELIQUIS) 5 MG TABS tablet Take 1 tablet (5 mg total) by mouth 2 (two) times daily.  . budesonide-formoterol (SYMBICORT) 160-4.5 MCG/ACT inhaler Inhale 2 puffs into the lungs 2 (two) times daily.  . Cyanocobalamin (VITAMIN B-12) 1000 MCG SUBL Place 1,000 mcg under the tongue every morning.  [name] Dextromethorphan-Guaifenesin (ROBITUSSIN DM PO) Take 15 mLs by mouth daily as needed (cough).  . docusate sodium 100 MG CAPS Take 100 mg by mouth 2 (two) times daily.  . finasteride (PROSCAR) 5 MG tablet Take 5 mg by mouth daily.   . flecainide (TAMBOCOR) 50 MG tablet Take 1 tablet (50 mg total) by mouth every 12 (twelve) hours.  . fluticasone (FLONASE) 50 MCG/ACT nasal spray Place 2 sprays into both nostrils daily.  . furosemide (LASIX)  20 MG tablet Take 1 tablet (20 mg total) by mouth daily.  [name] loratadine (CLARITIN) 10 MG tablet Take 1 tablet (10 mg total) by mouth daily.  [name] LORazepam (ATIVAN) 0.5 MG tablet 0.5 mg in the morning and 1 mg at bedtime  . metoprolol tartrate (LOPRESSOR) 25 MG tablet Take 0.5 tablets (12.5 mg total) by mouth once.  . montelukast (SINGULAIR) 10 MG tablet Take 1 tablet (10 mg total) by mouth at bedtime.  . Multiple Vitamin (MULTIVITAMIN) tablet Take 1 tablet by mouth daily. Chewable tablet with iron  . ondansetron (ZOFRAN-ODT) 4 MG disintegrating tablet Take 1 tablet (4 mg total) by  mouth every 8 (eight) hours as needed for nausea or vomiting.  . pantoprazole (PROTONIX) 40 MG tablet Take 1 tablet (40 mg total) by mouth 2 (two) times daily.  . polyethylene glycol (MIRALAX / GLYCOLAX) packet Take 17 g by mouth daily.  . potassium chloride (K-DUR,KLOR-CON) 10 MEQ tablet Take 1 tablet (10 mEq total) by mouth 3 (three) times daily.  . risperiDONE (RISPERDAL) 0.5 MG tablet Take 1 tablet (0.5 mg total) by mouth at bedtime.  . sertraline (ZOLOFT) 100 MG tablet Take 1.5 tablets (150 mg total) by mouth daily with breakfast.  . Tetrahydrozoline HCl (VISINE OP) Place 2 drops into both eyes daily as needed (dry eyes).  . [DISCONTINUED] benzonatate (TESSALON) 200 MG capsule Take 1 capsule (200 mg total) by mouth 3 (three) times daily as needed for cough.  . [DISCONTINUED] benzonatate (TESSALON) 200 MG capsule Take 200 mg by mouth 3 (three) times daily as needed for cough.  . [DISCONTINUED] LORazepam (ATIVAN) 0.5 MG tablet Take 0.5-1 mg by mouth 2 (two) times daily. 0.5 mg in the morning and 1 mg at bedtime  . [DISCONTINUED] risperiDONE (RISPERDAL) 0.5 MG tablet Take 1 tablet (0.5 mg total) by mouth at bedtime.  . [DISCONTINUED] sertraline (ZOLOFT) 100 MG tablet Take 1.5 tablets (150 mg total) by mouth daily with breakfast.    Recent Results (from the past 2160 hour(s))  CBC WITH DIFFERENTIAL     Status: Abnormal   Collection Time    07/13/13  5:30 PM      Result Value Ref Range   WBC 13.2 (*) 4.0 - 10.5 K/uL   RBC 4.07 (*) 4.22 - 5.81 MIL/uL   Hemoglobin 12.6 (*) 13.0 - 17.0 g/dL   HCT 36.9 (*) 39.0 - 52.0 %   MCV 90.7  78.0 - 100.0 fL   MCH 31.0  26.0 - 34.0 pg   MCHC 34.1  30.0 - 36.0 g/dL   RDW 14.5  11.5 - 15.5 %   Platelets 383  150 - 400 K/uL   Neutrophils Relative % 81 (*) 43 - 77 %   Neutro Abs 10.7 (*) 1.7 - 7.7 K/uL   Lymphocytes Relative 7 (*) 12 - 46 %   Lymphs Abs 1.0  0.7 - 4.0 K/uL   Monocytes Relative 11  3 - 12 %   Monocytes Absolute 1.4 (*) 0.1 - 1.0 K/uL    Eosinophils Relative 1  0 - 5 %   Eosinophils Absolute 0.1  0.0 - 0.7 K/uL   Basophils Relative 0  0 - 1 %   Basophils Absolute 0.0  0.0 - 0.1 K/uL  URINALYSIS, ROUTINE W REFLEX MICROSCOPIC     Status: Abnormal   Collection Time    07/13/13  5:51 PM      Result Value Ref Range   Color, Urine AMBER (*) YELLOW  Comment: BIOCHEMICALS MAY BE AFFECTED BY COLOR   APPearance CLEAR  CLEAR   Specific Gravity, Urine 1.023  1.005 - 1.030   pH 5.0  5.0 - 8.0   Glucose, UA NEGATIVE  NEGATIVE mg/dL   Hgb urine dipstick NEGATIVE  NEGATIVE   Bilirubin Urine SMALL (*) NEGATIVE   Ketones, ur NEGATIVE  NEGATIVE mg/dL   Protein, ur NEGATIVE  NEGATIVE mg/dL   Urobilinogen, UA 1.0  0.0 - 1.0 mg/dL   Nitrite NEGATIVE  NEGATIVE   Leukocytes, UA TRACE (*) NEGATIVE  URINE MICROSCOPIC-ADD ON     Status: None   Collection Time    07/13/13  5:51 PM      Result Value Ref Range   Squamous Epithelial / LPF RARE  RARE   WBC, UA 0-2  <3 WBC/hpf   RBC / HPF 0-2  <3 RBC/hpf   Urine-Other MUCOUS PRESENT    I-STAT TROPOININ, ED     Status: None   Collection Time    07/13/13  5:54 PM      Result Value Ref Range   Troponin i, poc 0.00  0.00 - 0.08 ng/mL   Comment 3            Comment: Due to the release kinetics of cTnI,     a negative result within the first hours     of the onset of symptoms does not rule out     myocardial infarction with certainty.     If myocardial infarction is still suspected,     repeat the test at appropriate intervals.  PRO B NATRIURETIC PEPTIDE     Status: Abnormal   Collection Time    07/13/13  6:35 PM      Result Value Ref Range   Pro B Natriuretic peptide (BNP) 519.5 (*) 0 - 125 pg/mL  COMPREHENSIVE METABOLIC PANEL     Status: Abnormal   Collection Time    07/13/13  6:35 PM      Result Value Ref Range   Sodium 137  137 - 147 mEq/L   Potassium 3.7  3.7 - 5.3 mEq/L   Chloride 100  96 - 112 mEq/L   CO2 22  19 - 32 mEq/L   Glucose, Bld 109 (*) 70 - 99 mg/dL   BUN 34 (*) 6 -  23 mg/dL   Creatinine, Ser 1.09  0.50 - 1.35 mg/dL   Calcium 9.0  8.4 - 10.5 mg/dL   Total Protein 6.9  6.0 - 8.3 g/dL   Albumin 2.8 (*) 3.5 - 5.2 g/dL   AST 95 (*) 0 - 37 U/L   ALT 141 (*) 0 - 53 U/L   Alkaline Phosphatase 314 (*) 39 - 117 U/L   Total Bilirubin 1.2  0.3 - 1.2 mg/dL   GFR calc non Af Amer 68 (*) >90 mL/min   GFR calc Af Amer 79 (*) >90 mL/min   Comment: (NOTE)     The eGFR has been calculated using the CKD EPI equation.     This calculation has not been validated in all clinical situations.     eGFR's persistently <90 mL/min signify possible Chronic Kidney     Disease.  Randolm Idol, ED     Status: None   Collection Time    07/13/13  8:47 PM      Result Value Ref Range   Troponin i, poc 0.00  0.00 - 0.08 ng/mL   Comment 3  Comment: Due to the release kinetics of cTnI,     a negative result within the first hours     of the onset of symptoms does not rule out     myocardial infarction with certainty.     If myocardial infarction is still suspected,     repeat the test at appropriate intervals.  D-DIMER, QUANTITATIVE     Status: Abnormal   Collection Time    07/15/13  8:54 AM      Result Value Ref Range   D-Dimer, Quant 2.30 (*) 0.00 - 0.48 ug/mL-FEU   Comment: At the inhouse established cutoff value of 0.48 ug/mL FEU, this     methology has been documented in the literature to have a sensitivity     and negative predictive value of at least 98-99%.  The test result     should be correlated with an assessment of the clinical probability of     DVT/VTE.  TROPONIN I     Status: None   Collection Time    07/15/13  8:54 AM      Result Value Ref Range   Troponin I 0.01  <0.06 ng/mL   Comment: No indication of myocardial injury.  CBC     Status: Abnormal   Collection Time    07/16/13  7:19 PM      Result Value Ref Range   WBC 13.8 (*) 4.0 - 10.5 K/uL   RBC 4.03 (*) 4.22 - 5.81 MIL/uL   Hemoglobin 12.2 (*) 13.0 - 17.0 g/dL   HCT 36.6 (*) 39.0 -  52.0 %   MCV 90.8  78.0 - 100.0 fL   MCH 30.3  26.0 - 34.0 pg   MCHC 33.3  30.0 - 36.0 g/dL   RDW 14.3  11.5 - 15.5 %   Platelets 340  150 - 400 K/uL  BASIC METABOLIC PANEL     Status: Abnormal   Collection Time    07/16/13  7:19 PM      Result Value Ref Range   Sodium 140  137 - 147 mEq/L   Potassium 3.8  3.7 - 5.3 mEq/L   Chloride 100  96 - 112 mEq/L   CO2 21  19 - 32 mEq/L   Glucose, Bld 105 (*) 70 - 99 mg/dL   BUN 38 (*) 6 - 23 mg/dL   Creatinine, Ser 1.35  0.50 - 1.35 mg/dL   Calcium 8.6  8.4 - 10.5 mg/dL   GFR calc non Af Amer 53 (*) >90 mL/min   GFR calc Af Amer 61 (*) >90 mL/min   Comment: (NOTE)     The eGFR has been calculated using the CKD EPI equation.     This calculation has not been validated in all clinical situations.     eGFR's persistently <90 mL/min signify possible Chronic Kidney     Disease.  Randolm Idol, ED     Status: None   Collection Time    07/16/13  7:27 PM      Result Value Ref Range   Troponin i, poc 0.01  0.00 - 0.08 ng/mL   Comment 3            Comment: Due to the release kinetics of cTnI,     a negative result within the first hours     of the onset of symptoms does not rule out     myocardial infarction with certainty.     If myocardial infarction is still suspected,  repeat the test at appropriate intervals.  ANTITHROMBIN III     Status: None   Collection Time    07/16/13  7:51 PM      Result Value Ref Range   AntiThromb III Func 96  75 - 120 %  PROTEIN C ACTIVITY     Status: Abnormal   Collection Time    07/16/13  7:51 PM      Result Value Ref Range   Protein C Activity 53 (*) 75 - 133 %   Comment: Performed at [place], TOTAL     Status: None   Collection Time    07/16/13  7:51 PM      Result Value Ref Range   Protein C, Total 120  72 - 160 %   Comment: ** Please note change in reference range(s). **     Performed at [facility]     Status: None   Collection Time     07/16/13  7:51 PM      Result Value Ref Range   Protein S Activity 92  69 - 129 %   Comment: Performed at [place], TOTAL     Status: None   Collection Time    07/16/13  7:51 PM      Result Value Ref Range   Protein S Ag, Total 110  60 - 150 %   Comment: Performed at [facility]     Status: Abnormal   Collection Time    07/16/13  7:51 PM      Result Value Ref Range   PTT Lupus Anticoagulant 38.9  28.0 - 43.0 secs   PTTLA Confirmation NOT APPL  <8.0 secs   PTTLA 4:1 Mix NOT APPL  28.0 - 43.0 secs   Drvvt 74.7 (*) <42.9 secs   Drvvt confirmation 0.69  <1.11 Ratio   dRVVT Incubated 1:1 Mix 48.1 (*) <42.9 secs   Lupus Anticoagulant NOT DETECTED  NOT DETECTED   Comment: Performed at [facility]  BETA-2-GLYCOPROTEIN I ABS, IGG/M/A     Status: None   Collection Time    07/16/13  7:51 PM      Result Value Ref Range   Beta-2 Glyco I IgG 4  <20 G Units   Beta-2-Glycoprotein I IgM 5  <20 M Units   Beta-2-Glycoprotein I IgA 9  <20 A Units   Comment: Performed at [facility]  HOMOCYSTEINE     Status: None   Collection Time    07/16/13  7:51 PM      Result Value Ref Range   Homocysteine 11.6  4.0 - 15.4 umol/L   Comment: Performed at [facility]     Status: None   Collection Time    07/16/13  7:51 PM      Result Value Ref Range   Recommendations-F5LEID: (NOTE)     Comment: ** NEGATIVE FOR FACTOR V MUTATION **     Reference Interval:     Negative for Factor V Mutation     Interpretation:     Factor V Leiden Gene Mutation (G1691A) is the most common genetic risk     factor for thrombosis and accounts for 94% of individuals classified     as Activated Protein C (APC) resistant.  Testing for Factor V Leiden     Mutation is recommended for all individuals with venous thrombosis  or     history of a thrombic event.     Both heterozygotes and homozygotes are at risk for thrombosis,  which     is 5-10 fold, and 50-100 fold respectively.     DNA testing for the V R2 polymorphism is recommended for Factor V     Leiden heterozygotes.  Presence of this polymorphism further increases     the risk of venous thrombosis in Factor V Leiden heterozygotes.     DNA from this patient was tested using a FDA approved assay for      Factor V Leiden.     Performed at [facility]     Status: None   Collection Time    07/16/13  7:51 PM      Result Value Ref Range   Recommendations-PTGENE: (NOTE)     Comment: **  NEGATIVE FOR PROTHROMBIN II GENE MUTATION  **     Reference Interval:     Negative for Prothrombin II Gene Mutation     Interpretation:     Prothrombin II 20210A Gene Mutation is a genetic risk factor resulting     in an increase risk for venous thrombosis (five-fold); Myocardial     infarction (two-fold); Cerebrovascular ischemic disease in young     patients (especially females using oral contraceptives) and pulmonary     embolism.     Both heterozygous and homozygous carriers are at increased risk     although homozygosity is rare.  The incidence of heterozygous carriers     of this mutation is estimated to be 1 to 2.5% for individuals of     European and African descent.     DNA from this patient was tested using a FDA approved assay for            Factor II Prothrombin Mutation.          Performed at [place], IGG, IGM, IGA     Status: Abnormal   Collection Time    07/16/13  7:51 PM      Result Value Ref Range   Anticardiolipin IgG 6 (*) <23 GPL U/mL   Anticardiolipin IgM 0 (*) <11 MPL U/mL   Anticardiolipin IgA 11 (*) <22 APL U/mL   Comment: (NOTE)     Reference Range:  Cardiolipin IgG       Normal                  <23       Low Positive (+)        23-35       Moderate Positive (+)   36-50       High Positive (+)       >50     Reference Range:  Cardiolipin IgM       Normal                   <11       Low Positive (+)        11-20       Moderate Positive (+)   21-30       High Positive (+)       >30     Reference Range:  Cardiolipin IgA       Normal                  <22       Low Positive (+)  22-35       Moderate Positive (+)   36-45       High Positive (+)       >45     Performed at [facility]  ANA     Status: None   Collection Time    07/17/13  3:25 AM      Result Value Ref Range   ANA NEGATIVE  NEGATIVE   Comment: Performed at [facility]     Status: Abnormal   Collection Time    07/17/13  3:25 AM      Result Value Ref Range   Rheumatoid Factor 16 (*) <=14 IU/mL   Comment: (NOTE)                             Interpretive Table                        Low Positive: 15 - 41 IU/mL                        High Positive:  >= 42 IU/mL     In addition to the RF result, and clinical symptoms including joint     involvement, the 2010 ACR Classification Criteria for     scoring/diagnosing Rheumatoid Arthritis include the results of the     following tests:  CRP (73220), ESR (15010), and CCP (APCA) (25427).     www.rheumatology.org/practice/clinical/classification/ra/ra_2010.asp     Performed at [facility]  TROPONIN I     Status: None   Collection Time    07/17/13  8:00 PM      Result Value Ref Range   Troponin I <0.30  <0.30 ng/mL   Comment:            Due to the release kinetics of cTnI,     a negative result within the first hours     of the onset of symptoms does not rule out     myocardial infarction with certainty.     If myocardial infarction is still suspected,     repeat the test at appropriate intervals.  TROPONIN I     Status: None   Collection Time    07/18/13 12:38 AM      Result Value Ref Range   Troponin I <0.30  <0.30 ng/mL   Comment:            Due to the release kinetics of cTnI,     a negative result within the first hours     of the onset of symptoms does not rule out     myocardial  infarction with certainty.     If myocardial infarction is still suspected,     repeat the test at appropriate intervals.  MRSA PCR SCREENING     Status: None   Collection Time    07/18/13  4:03 AM      Result Value Ref Range   MRSA by PCR NEGATIVE  NEGATIVE   Comment:            The GeneXpert MRSA Assay (FDA     approved for NASAL specimens     only), is one component of a     comprehensive MRSA colonization     surveillance program. It is not     intended to diagnose MRSA     infection  nor to guide or     monitor treatment for     MRSA infections.  CBC     Status: Abnormal   Collection Time    07/18/13  4:05 AM      Result Value Ref Range   WBC 13.0 (*) 4.0 - 10.5 K/uL   RBC 3.80 (*) 4.22 - 5.81 MIL/uL   Hemoglobin 11.4 (*) 13.0 - 17.0 g/dL   HCT 34.7 (*) 39.0 - 52.0 %   MCV 91.3  78.0 - 100.0 fL   MCH 30.0  26.0 - 34.0 pg   MCHC 32.9  30.0 - 36.0 g/dL   RDW 14.8  11.5 - 15.5 %   Platelets 319  150 - 400 K/uL  BASIC METABOLIC PANEL     Status: Abnormal   Collection Time    07/18/13  4:05 AM      Result Value Ref Range   Sodium 141  137 - 147 mEq/L   Potassium 3.2 (*) 3.7 - 5.3 mEq/L   Chloride 103  96 - 112 mEq/L   CO2 23  19 - 32 mEq/L   Glucose, Bld 112 (*) 70 - 99 mg/dL   BUN 38 (*) 6 - 23 mg/dL   Creatinine, Ser 1.20  0.50 - 1.35 mg/dL   Calcium 8.7  8.4 - 10.5 mg/dL   GFR calc non Af Amer 61 (*) >90 mL/min   GFR calc Af Amer 71 (*) >90 mL/min   Comment: (NOTE)     The eGFR has been calculated using the CKD EPI equation.     This calculation has not been validated in all clinical situations.     eGFR's persistently <90 mL/min signify possible Chronic Kidney     Disease.  HEPARIN LEVEL (UNFRACTIONATED)     Status: Abnormal   Collection Time    07/18/13  4:05 AM      Result Value Ref Range   Heparin Unfractionated >2.20 (*) 0.30 - 0.70 IU/mL   Comment: RESULTS CONFIRMED BY MANUAL DILUTION                IF HEPARIN RESULTS ARE BELOW     EXPECTED VALUES,  AND PATIENT     DOSAGE HAS BEEN CONFIRMED,     SUGGEST FOLLOW UP TESTING     OF ANTITHROMBIN III LEVELS.  APTT     Status: Abnormal   Collection Time    07/18/13  4:05 AM      Result Value Ref Range   aPTT 41 (*) 24 - 37 seconds   Comment:            IF BASELINE aPTT IS ELEVATED,     SUGGEST PATIENT RISK ASSESSMENT     BE USED TO DETERMINE APPROPRIATE     ANTICOAGULANT THERAPY.  TROPONIN I     Status: None   Collection Time    07/18/13  7:27 AM      Result Value Ref Range   Troponin I <0.30  <0.30 ng/mL   Comment:            Due to the release kinetics of cTnI,     a negative result within the first hours     of the onset of symptoms does not rule out     myocardial infarction with certainty.     If myocardial infarction is still suspected,     repeat the test at appropriate intervals.  HEPATIC FUNCTION PANEL     Status: Abnormal  Collection Time    07/18/13  7:27 AM      Result Value Ref Range   Total Protein 6.7  6.0 - 8.3 g/dL   Albumin 2.6 (*) 3.5 - 5.2 g/dL   AST 146 (*) 0 - 37 U/L   Comment: HEMOLYSIS AT THIS LEVEL MAY AFFECT RESULT     NO VISIBLE HEMOLYSIS   ALT 152 (*) 0 - 53 U/L   Alkaline Phosphatase 508 (*) 39 - 117 U/L   Total Bilirubin 1.0  0.3 - 1.2 mg/dL   Bilirubin, Direct 0.5 (*) 0.0 - 0.3 mg/dL   Comment: HEMOLYSIS AT THIS LEVEL MAY AFFECT RESULT     NO VISIBLE HEMOLYSIS   Indirect Bilirubin 0.5  0.3 - 0.9 mg/dL  TYPE AND SCREEN     Status: None   Collection Time    07/18/13 12:40 PM      Result Value Ref Range   ABO/RH(D) O POS     Antibody Screen NEG     Sample Expiration 07/21/2013    ABO/RH     Status: None   Collection Time    07/18/13 12:40 PM      Result Value Ref Range   ABO/RH(D) O POS    BODY FLUID CULTURE     Status: None   Collection Time    07/18/13 12:43 PM      Result Value Ref Range   Specimen Description FLUID PERICARDIAL     Special Requests PT ON ZINACEF 1.5      Gram Stain       Value: RARE WBC PRESENT,BOTH PMN AND  MONONUCLEAR     NO ORGANISMS SEEN     TECH REVIEW     Performed at [facility]   Culture       Value: NO GROWTH 4 DAYS     Performed at [facility]   Report Status 07/22/2013 FINAL    FUNGUS CULTURE W SMEAR     Status: None   Collection Time    07/18/13 12:43 PM      Result Value Ref Range   Specimen Description FLUID PERICARDIAL     Special Requests PT ON ZINACEF 1.5     Fungal Smear       Value: NO YEAST OR FUNGAL ELEMENTS SEEN     Performed at [facility]   Culture       Value: No Fungi Isolated in 4 Weeks     Performed at [facility]   Report Status 08/14/2013 FINAL    ANAEROBIC CULTURE     Status: None   Collection Time    07/18/13 12:43 PM      Result Value Ref Range   Specimen Description FLUID PERICARDIAL     Special Requests PT ON ZINACEF 1.5     Gram Stain       Value: RARE WBC PRESENT,BOTH PMN AND MONONUCLEAR     NO ORGANISMS SEEN     Performed at [facility]   Culture       Value: NO ANAEROBES ISOLATED     Performed at [facility]   Report Status 07/23/2013 FINAL    GRAM STAIN     Status: None   Collection Time    07/18/13 12:43 PM      Result Value Ref Range   Specimen Description FLUID PERICARDIAL     Special Requests PT ON ZINACEF 1.5     Gram Stain  Value: RARE WBC PRESENT,BOTH PMN AND MONONUCLEAR     NO ORGANISMS SEEN   Report Status 07/18/2013 FINAL    BODY FLUID CULTURE     Status: None   Collection Time    07/18/13 12:43 PM      Result Value Ref Range   Specimen Description FLUID PERICARDIAL     Special Requests FLUID ON SWAB PATIEN ON FOLLOWING ZINACEF 1.5     Gram Stain       Value: RARE WBC PRESENT,BOTH PMN AND MONONUCLEAR     NO ORGANISMS SEEN     Performed at [facility]   Culture       Value: NO GROWTH 3 DAYS     Performed at [facility]   Report Status 07/21/2013 FINAL    FUNGUS CULTURE W SMEAR     Status: None   Collection Time    07/18/13 12:43 PM       Result Value Ref Range   Specimen Description FLUID PERICARDIAL     Special Requests PT ON ZINACEF 1.5 FLUID ON SWAB     Fungal Smear       Value: NO YEAST OR FUNGAL ELEMENTS SEEN     Performed at [facility]   Culture       Value: No Fungi Isolated in 4 Weeks     Performed at [facility]   Report Status 08/14/2013 FINAL    AFB CULTURE WITH SMEAR     Status: None   Collection Time    07/18/13 12:43 PM      Result Value Ref Range   Specimen Description FLUID PERICARDIAL     Special Requests PT ON ZINACEF 1.5 FLUID ON SWAB     Acid Fast Smear       Value: NO ACID FAST BACILLI SEEN     Performed at [facility]   Culture       Value: NO ACID FAST BACILLI ISOLATED IN 6 WEEKS     Performed at [facility]   Report Status 08/31/2013 FINAL    ANAEROBIC CULTURE     Status: None   Collection Time    07/18/13 12:43 PM      Result Value Ref Range   Specimen Description FLUID PERICARDIAL     Special Requests FLUID ON SWAB     Gram Stain       Value: RARE WBC PRESENT,BOTH PMN AND MONONUCLEAR     NO ORGANISMS SEEN     Performed at [facility]   Culture       Value: NO ANAEROBES ISOLATED     Performed at [facility]   Report Status 07/23/2013 FINAL    VIRAL CULTURE VIRC     Status: None   Collection Time    07/18/13 12:43 PM      Result Value Ref Range   Specimen Description FLUID PERICARDIAL     Special Requests NONE     Culture       Value: No Virus Isolated in Cell Culture                                                                A negative result does not exclude the possibility of  virus infection;inappropriate specimen collection,storage and transport may lead to false negative      culture results.     Note:    The current Enterovirus culture system does not detect Enterovirus D68. If Enterovirus D68 is suspected, please call client services to add the test for Enterovirus PCR (Test code [phone]). GROSSLY BLOODY SPECIMENS  SUBMITTED FOR CELL CULTURE CAN      INHIBIT VIRAL REPLICATON CAUSING FALSE NEGATIVES.     Performed at [facility]   Report Status 07/28/2013 FINAL    PROTEIN, BODY FLUID     Status: None   Collection Time    07/18/13 12:46 PM      Result Value Ref Range   Total protein, fluid 4.9     Comment: NO NORMAL RANGE ESTABLISHED FOR THIS TEST   Fluid Type-FTP PERICARDIAL    LACTATE DEHYDROGENASE, BODY FLUID     Status: Abnormal   Collection Time    07/18/13 12:46 PM      Result Value Ref Range   LD, Fluid 602 (*) 3 - 23 U/L   Comment: HEMOLYSIS AT THIS LEVEL MAY AFFECT RESULT   Fluid Type-FLDH PERICARDIAL    GLUCOSE, SEROUS FLUID     Status: None   Collection Time    07/18/13 12:46 PM      Result Value Ref Range   Glucose, Fluid 97     Comment:            FLUID GLUCOSE LEVELS OF <60     mg/dL OR VALUES OF 40 mg/dL     LESS THAN A SIMULTANEOUS     SERUM LEVEL ARE CONSIDERED     DECREASED.   Fluid Type-FGLU PERICARDIAL    BODY FLUID CELL COUNT WITH DIFFERENTIAL     Status: Abnormal   Collection Time    07/18/13 12:46 PM      Result Value Ref Range   Fluid Type-FCT PERICARDIAL     Color, Fluid RED (*) YELLOW   Appearance, Fluid TURBID (*) CLEAR   WBC, Fluid 3400 (*) 0 - 1000 cu mm   Neutrophil Count, Fluid 77 (*) 0 - 25 %   Lymphs, Fluid 12     Monocyte-Macrophage-Serous Fluid 9 (*) 50 - 90 %   Eos, Fluid 2    PATHOLOGIST SMEAR REVIEW     Status: None   Collection Time    07/18/13 12:46 PM      Result Value Ref Range   Path Review BLOOD WITH LEUKOCYTES.     Comment: Reviewed by Chrystie Nose. Saralyn Pilar, M.D.     07/21/13.  HEPATIC FUNCTION PANEL     Status: Abnormal   Collection Time    07/18/13  6:00 PM      Result Value Ref Range   Total Protein 6.5  6.0 - 8.3 g/dL   Albumin 2.5 (*) 3.5 - 5.2 g/dL   AST 139 (*) 0 - 37 U/L   ALT 157 (*) 0 - 53 U/L   Alkaline Phosphatase 461 (*) 39 - 117 U/L   Total Bilirubin 1.0  0.3 - 1.2 mg/dL   Bilirubin, Direct 0.5 (*) 0.0 - 0.3  mg/dL   Indirect Bilirubin 0.5  0.3 - 0.9 mg/dL  COMPREHENSIVE METABOLIC PANEL     Status: Abnormal   Collection Time    07/19/13  4:20 AM      Result Value Ref Range   Sodium 139  137 - 147 mEq/L   Potassium 4.8  3.7 -  5.3 mEq/L   Comment: DELTA CHECK NOTED   Chloride 104  96 - 112 mEq/L   CO2 23  19 - 32 mEq/L   Glucose, Bld 175 (*) 70 - 99 mg/dL   BUN 36 (*) 6 - 23 mg/dL   Creatinine, Ser 1.08  0.50 - 1.35 mg/dL   Calcium 8.3 (*) 8.4 - 10.5 mg/dL   Total Protein 6.3  6.0 - 8.3 g/dL   Albumin 2.4 (*) 3.5 - 5.2 g/dL   AST 106 (*) 0 - 37 U/L   ALT 139 (*) 0 - 53 U/L   Alkaline Phosphatase 413 (*) 39 - 117 U/L   Total Bilirubin 0.9  0.3 - 1.2 mg/dL   GFR calc non Af Amer 69 (*) >90 mL/min   GFR calc Af Amer 80 (*) >90 mL/min   Comment: (NOTE)     The eGFR has been calculated using the CKD EPI equation.     This calculation has not been validated in all clinical situations.     eGFR's persistently <90 mL/min signify possible Chronic Kidney     Disease.  CBC     Status: Abnormal   Collection Time    07/19/13  4:20 AM      Result Value Ref Range   WBC 13.8 (*) 4.0 - 10.5 K/uL   RBC 3.54 (*) 4.22 - 5.81 MIL/uL   Hemoglobin 10.7 (*) 13.0 - 17.0 g/dL   HCT 32.4 (*) 39.0 - 52.0 %   MCV 91.5  78.0 - 100.0 fL   MCH 30.2  26.0 - 34.0 pg   MCHC 33.0  30.0 - 36.0 g/dL   RDW 14.5  11.5 - 15.5 %   Platelets 323  150 - 400 K/uL  APTT     Status: Abnormal   Collection Time    07/19/13  4:20 AM      Result Value Ref Range   aPTT 47 (*) 24 - 37 seconds   Comment:            IF BASELINE aPTT IS ELEVATED,     SUGGEST PATIENT RISK ASSESSMENT     BE USED TO DETERMINE APPROPRIATE     ANTICOAGULANT THERAPY.  COMPREHENSIVE METABOLIC PANEL     Status: Abnormal   Collection Time    07/20/13  4:20 AM      Result Value Ref Range   Sodium 139  137 - 147 mEq/L   Potassium 4.1  3.7 - 5.3 mEq/L   Chloride 105  96 - 112 mEq/L   CO2 23  19 - 32 mEq/L   Glucose, Bld 103 (*) 70 - 99 mg/dL   BUN  42 (*) 6 - 23 mg/dL   Creatinine, Ser 1.03  0.50 - 1.35 mg/dL   Calcium 8.2 (*) 8.4 - 10.5 mg/dL   Total Protein 5.9 (*) 6.0 - 8.3 g/dL   Albumin 2.3 (*) 3.5 - 5.2 g/dL   AST 77 (*) 0 - 37 U/L   ALT 118 (*) 0 - 53 U/L   Alkaline Phosphatase 349 (*) 39 - 117 U/L   Total Bilirubin 0.6  0.3 - 1.2 mg/dL   GFR calc non Af Amer 73 (*) >90 mL/min   GFR calc Af Amer 85 (*) >90 mL/min   Comment: (NOTE)     The eGFR has been calculated using the CKD EPI equation.     This calculation has not been validated in all clinical situations.     eGFR's persistently <  90 mL/min signify possible Chronic Kidney     Disease.  CBC     Status: Abnormal   Collection Time    07/20/13  4:20 AM      Result Value Ref Range   WBC 14.0 (*) 4.0 - 10.5 K/uL   RBC 3.51 (*) 4.22 - 5.81 MIL/uL   Hemoglobin 10.4 (*) 13.0 - 17.0 g/dL   HCT 32.3 (*) 39.0 - 52.0 %   MCV 92.0  78.0 - 100.0 fL   MCH 29.6  26.0 - 34.0 pg   MCHC 32.2  30.0 - 36.0 g/dL   RDW 14.7  11.5 - 15.5 %   Platelets 304  150 - 400 K/uL  CBC     Status: Abnormal   Collection Time    07/21/13  4:57 AM      Result Value Ref Range   WBC 17.6 (*) 4.0 - 10.5 K/uL   RBC 3.54 (*) 4.22 - 5.81 MIL/uL   Hemoglobin 10.6 (*) 13.0 - 17.0 g/dL   HCT 32.5 (*) 39.0 - 52.0 %   MCV 91.8  78.0 - 100.0 fL   MCH 29.9  26.0 - 34.0 pg   MCHC 32.6  30.0 - 36.0 g/dL   RDW 14.4  11.5 - 15.5 %   Platelets 430 (*) 150 - 400 K/uL   Comment: DELTA CHECK NOTED  COMPREHENSIVE METABOLIC PANEL     Status: Abnormal   Collection Time    07/21/13  4:57 AM      Result Value Ref Range   Sodium 144  137 - 147 mEq/L   Potassium 3.8  3.7 - 5.3 mEq/L   Chloride 104  96 - 112 mEq/L   CO2 24  19 - 32 mEq/L   Glucose, Bld 105 (*) 70 - 99 mg/dL   BUN 67 (*) 6 - 23 mg/dL   Comment: DELTA CHECK NOTED   Creatinine, Ser 1.01  0.50 - 1.35 mg/dL   Calcium 8.3 (*) 8.4 - 10.5 mg/dL   Total Protein 6.3  6.0 - 8.3 g/dL   Albumin 2.5 (*) 3.5 - 5.2 g/dL   AST 49 (*) 0 - 37 U/L   ALT 97  (*) 0 - 53 U/L   Alkaline Phosphatase 359 (*) 39 - 117 U/L   Total Bilirubin 0.6  0.3 - 1.2 mg/dL   GFR calc non Af Amer 75 (*) >90 mL/min   GFR calc Af Amer 87 (*) >90 mL/min   Comment: (NOTE)     The eGFR has been calculated using the CKD EPI equation.     This calculation has not been validated in all clinical situations.     eGFR's persistently <90 mL/min signify possible Chronic Kidney     Disease.  OCCULT BLOOD X 1 CARD TO LAB, STOOL     Status: Abnormal   Collection Time    07/21/13  5:49 PM      Result Value Ref Range   Fecal Occult Bld POSITIVE (*) NEGATIVE  PROCALCITONIN     Status: None   Collection Time    07/21/13  6:00 PM      Result Value Ref Range   Procalcitonin <0.10     Comment:            Interpretation:     PCT (Procalcitonin) <= 0.5 ng/mL:     Systemic infection (sepsis) is not likely.     Local bacterial infection is possible.     (NOTE)  ICU PCT Algorithm               Non ICU PCT Algorithm        ----------------------------     ------------------------------             PCT < 0.25 ng/mL                 PCT < 0.1 ng/mL         Stopping of antibiotics            Stopping of antibiotics           strongly encouraged.               strongly encouraged.        ----------------------------     ------------------------------           PCT level decrease by               PCT < 0.25 ng/mL           >= 80% from peak PCT           OR PCT 0.25 - 0.5 ng/mL          Stopping of antibiotics                                                 encouraged.         Stopping of antibiotics               encouraged.        ----------------------------     ------------------------------           PCT level decrease by              PCT >= 0.25 ng/mL           < 80% from peak PCT            AND PCT >= 0.5 ng/mL            Continuing antibiotics                                                  encouraged.           Continuing antibiotics                encouraged.         ----------------------------     ------------------------------         PCT level increase compared          PCT > 0.5 ng/mL             with peak PCT AND              PCT >= 0.5 ng/mL             Escalation of antibiotics                                              strongly encouraged.          Escalation of antibiotics  strongly encouraged.  CBC     Status: Abnormal   Collection Time    07/21/13  6:00 PM      Result Value Ref Range   WBC 20.1 (*) 4.0 - 10.5 K/uL   RBC 3.02 (*) 4.22 - 5.81 MIL/uL   Hemoglobin 9.1 (*) 13.0 - 17.0 g/dL   HCT 27.7 (*) 39.0 - 52.0 %   MCV 91.7  78.0 - 100.0 fL   MCH 30.1  26.0 - 34.0 pg   MCHC 32.9  30.0 - 36.0 g/dL   RDW 14.5  11.5 - 15.5 %   Platelets 420 (*) 150 - 400 K/uL  HEMOGLOBIN AND HEMATOCRIT, BLOOD     Status: Abnormal   Collection Time    07/21/13 10:10 PM      Result Value Ref Range   Hemoglobin 8.3 (*) 13.0 - 17.0 g/dL   HCT 25.5 (*) 39.0 - 52.0 %  COMPREHENSIVE METABOLIC PANEL     Status: Abnormal   Collection Time    07/22/13  5:31 AM      Result Value Ref Range   Sodium 145  137 - 147 mEq/L   Potassium 3.7  3.7 - 5.3 mEq/L   Chloride 108  96 - 112 mEq/L   CO2 24  19 - 32 mEq/L   Glucose, Bld 134 (*) 70 - 99 mg/dL   BUN 59 (*) 6 - 23 mg/dL   Creatinine, Ser 1.01  0.50 - 1.35 mg/dL   Calcium 8.1 (*) 8.4 - 10.5 mg/dL   Total Protein 5.4 (*) 6.0 - 8.3 g/dL   Albumin 2.3 (*) 3.5 - 5.2 g/dL   AST 41 (*) 0 - 37 U/L   ALT 70 (*) 0 - 53 U/L   Alkaline Phosphatase 221 (*) 39 - 117 U/L   Total Bilirubin 0.4  0.3 - 1.2 mg/dL   GFR calc non Af Amer 75 (*) >90 mL/min   GFR calc Af Amer 87 (*) >90 mL/min   Comment: (NOTE)     The eGFR has been calculated using the CKD EPI equation.     This calculation has not been validated in all clinical situations.     eGFR's persistently <90 mL/min signify possible Chronic Kidney     Disease.  CBC     Status: Abnormal   Collection Time    07/22/13  5:31 AM      Result Value  Ref Range   WBC 21.5 (*) 4.0 - 10.5 K/uL   RBC 2.60 (*) 4.22 - 5.81 MIL/uL   Hemoglobin 7.8 (*) 13.0 - 17.0 g/dL   HCT 23.5 (*) 39.0 - 52.0 %   MCV 90.4  78.0 - 100.0 fL   MCH 30.0  26.0 - 34.0 pg   MCHC 33.2  30.0 - 36.0 g/dL   RDW 14.3  11.5 - 15.5 %   Platelets 437 (*) 150 - 400 K/uL  PREPARE RBC (CROSSMATCH)     Status: None   Collection Time    07/22/13  9:26 AM      Result Value Ref Range   Order Confirmation ORDER PROCESSED BY BLOOD BANK    TYPE AND SCREEN     Status: None   Collection Time    07/22/13  9:26 AM      Result Value Ref Range   ABO/RH(D) O POS     Antibody Screen NEG     Sample Expiration 07/25/2013     Unit Number [id number]  Blood Component Type RED CELLS,LR     Unit division 00     Status of Unit ISSUED,FINAL     Transfusion Status OK TO TRANSFUSE     Crossmatch Result Compatible     Unit Number [id number]     Blood Component Type RED CELLS,LR     Unit division 00     Status of Unit ISSUED,FINAL     Transfusion Status OK TO TRANSFUSE     Crossmatch Result Compatible    HEMOGLOBIN AND HEMATOCRIT, BLOOD     Status: Abnormal   Collection Time    07/22/13  9:04 PM      Result Value Ref Range   Hemoglobin 8.1 (*) 13.0 - 17.0 g/dL   HCT 24.5 (*) 39.0 - 52.0 %  PROCALCITONIN     Status: None   Collection Time    07/23/13  6:36 AM      Result Value Ref Range   Procalcitonin <0.10    HEMOGLOBIN AND HEMATOCRIT, BLOOD     Status: Abnormal   Collection Time    07/23/13  6:36 AM      Result Value Ref Range   Hemoglobin 8.0 (*) 13.0 - 17.0 g/dL   HCT 24.5 (*) 39.0 - 52.0 %  HEMOGLOBIN AND HEMATOCRIT, BLOOD     Status: Abnormal   Collection Time    07/23/13  8:30 PM      Result Value Ref Range   Hemoglobin 7.8 (*) 13.0 - 17.0 g/dL   HCT 23.6 (*) 39.0 - 86.5 %  BASIC METABOLIC PANEL     Status: Abnormal   Collection Time    07/24/13  2:55 AM      Result Value Ref Range   Sodium 143  137 - 147 mEq/L   Potassium 4.2  3.7 - 5.3 mEq/L    Chloride 108  96 - 112 mEq/L   CO2 24  19 - 32 mEq/L   Glucose, Bld 94  70 - 99 mg/dL   BUN 30 (*) 6 - 23 mg/dL   Comment: DELTA CHECK NOTED   Creatinine, Ser 1.16  0.50 - 1.35 mg/dL   Calcium 7.8 (*) 8.4 - 10.5 mg/dL   GFR calc non Af Amer 63 (*) >90 mL/min   GFR calc Af Amer 73 (*) >90 mL/min   Comment: (NOTE)     The eGFR has been calculated using the CKD EPI equation.     This calculation has not been validated in all clinical situations.     eGFR's persistently <90 mL/min signify possible Chronic Kidney     Disease.  HEMOGLOBIN AND HEMATOCRIT, BLOOD     Status: Abnormal   Collection Time    07/24/13  2:55 AM      Result Value Ref Range   Hemoglobin 7.7 (*) 13.0 - 17.0 g/dL   HCT 23.5 (*) 39.0 - 52.0 %  OCCULT BLOOD X 1 CARD TO LAB, STOOL     Status: Abnormal   Collection Time    07/24/13  9:50 AM      Result Value Ref Range   Fecal Occult Bld POSITIVE (*) NEGATIVE  CBC     Status: Abnormal   Collection Time    07/25/13  5:53 AM      Result Value Ref Range   WBC 13.0 (*) 4.0 - 10.5 K/uL   RBC 2.92 (*) 4.22 - 5.81 MIL/uL   Hemoglobin 8.6 (*) 13.0 - 17.0 g/dL   HCT 26.5 (*) 39.0 -  52.0 %   MCV 90.8  78.0 - 100.0 fL   MCH 29.5  26.0 - 34.0 pg   MCHC 32.5  30.0 - 36.0 g/dL   RDW 16.8 (*) 11.5 - 15.5 %   Platelets 405 (*) 150 - 400 K/uL  BASIC METABOLIC PANEL     Status: Abnormal   Collection Time    07/25/13  5:53 AM      Result Value Ref Range   Sodium 144  137 - 147 mEq/L   Potassium 3.5 (*) 3.7 - 5.3 mEq/L   Chloride 105  96 - 112 mEq/L   CO2 27  19 - 32 mEq/L   Glucose, Bld 105 (*) 70 - 99 mg/dL   BUN 20  6 - 23 mg/dL   Creatinine, Ser 1.11  0.50 - 1.35 mg/dL   Calcium 8.3 (*) 8.4 - 10.5 mg/dL   GFR calc non Af Amer 67 (*) >90 mL/min   GFR calc Af Amer 77 (*) >90 mL/min   Comment: (NOTE)     The eGFR has been calculated using the CKD EPI equation.     This calculation has not been validated in all clinical situations.     eGFR's persistently <90 mL/min  signify possible Chronic Kidney     Disease.  BASIC METABOLIC PANEL     Status: Abnormal   Collection Time    07/28/13 11:30 AM      Result Value Ref Range   Sodium 135  135 - 145 mEq/L   Potassium 3.9  3.5 - 5.1 mEq/L   Chloride 100  96 - 112 mEq/L   CO2 22  19 - 32 mEq/L   Glucose, Bld 138 (*) 70 - 99 mg/dL   BUN 28 (*) 6 - 23 mg/dL   Creatinine, Ser 1.4  0.4 - 1.5 mg/dL   Calcium 8.0 (*) 8.4 - 10.5 mg/dL   GFR 52.33 (*) >60.00 mL/min  CBC WITH DIFFERENTIAL     Status: Abnormal   Collection Time    07/28/13 11:30 AM      Result Value Ref Range   WBC 21.8 Repeated and verified X2. (*) 4.5 - 10.5 K/uL   RBC 3.15 (*) 4.22 - 5.81 Mil/uL   Hemoglobin 9.3 (*) 13.0 - 17.0 g/dL   HCT 28.2 (*) 39.0 - 52.0 %   MCV 89.6  78.0 - 100.0 fl   MCHC 32.9  30.0 - 36.0 g/dL   RDW 16.8 (*) 11.5 - 14.6 %   Platelets 478.0 (*) 150.0 - 400.0 K/uL   Neutrophils Relative % 86.4 Repeated and verified X2. (*) 43.0 - 77.0 %   Comment: Manual smear review agrees with instrument differential. less than 20% of the pts. plts. were seen clumping   Lymphocytes Relative 4.3 (*) 12.0 - 46.0 %   Monocytes Relative 8.9  3.0 - 12.0 %   Eosinophils Relative 0.1  0.0 - 5.0 %   Basophils Relative 0.3  0.0 - 3.0 %   Neutro Abs 18.9 (*) 1.4 - 7.7 K/uL   Lymphs Abs 0.9  0.7 - 4.0 K/uL   Monocytes Absolute 1.9 (*) 0.1 - 1.0 K/uL   Eosinophils Absolute 0.0  0.0 - 0.7 K/uL   Basophils Absolute 0.1  0.0 - 0.1 K/uL  URIC ACID     Status: None   Collection Time    07/28/13 11:30 AM      Result Value Ref Range   Uric Acid, Serum 6.4  4.0 - 7.8  mg/dL  HEPATIC FUNCTION PANEL     Status: Abnormal   Collection Time    07/28/13 11:30 AM      Result Value Ref Range   Total Bilirubin 1.3 (*) 0.3 - 1.2 mg/dL   Bilirubin, Direct 0.6 (*) 0.0 - 0.3 mg/dL   Alkaline Phosphatase 382 (*) 39 - 117 U/L   AST 359 (*) 0 - 37 U/L   ALT 224 (*) 0 - 53 U/L   Total Protein 6.3  6.0 - 8.3 g/dL   Albumin 2.7 (*) 3.5 - 5.2 g/dL  LACTIC  ACID, PLASMA     Status: None   Collection Time    07/28/13  9:59 PM      Result Value Ref Range   Lactic Acid, Venous 1.3  0.5 - 2.2 mmol/L  CULTURE, BLOOD (ROUTINE X 2)     Status: None   Collection Time    07/28/13 10:31 PM      Result Value Ref Range   Specimen Description BLOOD RIGHT ANTECUBITAL     Special Requests       Value: BOTTLES DRAWN AEROBIC AND ANAEROBIC 8CC BLUE 4CC RED   Culture  Setup Time       Value: 07/29/2013 04:07     Performed at [facility]   Culture       Value: DIPHTHEROIDS(CORYNEBACTERIUM SPECIES)     Note: Standardized susceptibility testing for this organism is not available.     Note: Gram Stain Report Called to,Read Back By and Verified With: MICHELLE MOORE 08/02/13 @ 5:26PM BY RUSCOE A.     Performed at [facility]   Report Status 08/04/2013 FINAL    CULTURE, BLOOD (ROUTINE X 2)     Status: None   Collection Time    07/28/13 10:40 PM      Result Value Ref Range   Specimen Description BLOOD RIGHT FOREARM     Special Requests BOTTLES DRAWN AEROBIC ONLY 4CC     Culture  Setup Time       Value: 07/29/2013 04:06     Performed at [facility]   Culture       Value: NO GROWTH 5 DAYS     Performed at [facility]   Report Status 08/04/2013 FINAL    URINALYSIS, ROUTINE W REFLEX MICROSCOPIC     Status: Abnormal   Collection Time    07/28/13 11:03 PM      Result Value Ref Range   Color, Urine AMBER (*) YELLOW   Comment: BIOCHEMICALS MAY BE AFFECTED BY COLOR   APPearance CLEAR  CLEAR   Specific Gravity, Urine 1.024  1.005 - 1.030   pH 5.0  5.0 - 8.0   Glucose, UA NEGATIVE  NEGATIVE mg/dL   Hgb urine dipstick NEGATIVE  NEGATIVE   Bilirubin Urine SMALL (*) NEGATIVE   Ketones, ur NEGATIVE  NEGATIVE mg/dL   Protein, ur NEGATIVE  NEGATIVE mg/dL   Urobilinogen, UA 1.0  0.0 - 1.0 mg/dL   Nitrite NEGATIVE  NEGATIVE   Leukocytes, UA NEGATIVE  NEGATIVE   Comment: MICROSCOPIC NOT DONE ON URINES WITH NEGATIVE PROTEIN, BLOOD,  LEUKOCYTES, NITRITE, OR GLUCOSE <1000 mg/dL.  URINE CULTURE     Status: None   Collection Time    07/28/13 11:04 PM      Result Value Ref Range   Specimen Description URINE, CATHETERIZED     Special Requests NONE     Culture  Setup Time  Value: 07/29/2013 00:07     Performed at [facility]       Value: NO GROWTH     Performed at [facility]   Culture       Value: NO GROWTH     Performed at [facility]   Report Status 07/30/2013 FINAL    HEPATITIS PANEL, ACUTE     Status: None   Collection Time    07/29/13  1:26 AM      Result Value Ref Range   Hepatitis B Surface Ag NEGATIVE  NEGATIVE   HCV Ab NEGATIVE  NEGATIVE   Hep A IgM NON REACTIVE  NON REACTIVE   Hep B C IgM NON REACTIVE  NON REACTIVE   Comment: (NOTE)     High levels of Hepatitis B Core IgM antibody are detectable     during the acute stage of Hepatitis B. This antibody is used     to differentiate current from past HBV infection.     Performed at [facility]  CBC     Status: Abnormal   Collection Time    07/29/13  5:10 AM      Result Value Ref Range   WBC 15.7 (*) 4.0 - 10.5 K/uL   RBC 2.98 (*) 4.22 - 5.81 MIL/uL   Hemoglobin 8.7 (*) 13.0 - 17.0 g/dL   HCT 26.7 (*) 39.0 - 52.0 %   MCV 89.6  78.0 - 100.0 fL   MCH 29.2  26.0 - 34.0 pg   MCHC 32.6  30.0 - 36.0 g/dL   RDW 16.2 (*) 11.5 - 15.5 %   Platelets 402 (*) 150 - 400 K/uL  BASIC METABOLIC PANEL     Status: Abnormal   Collection Time    07/29/13  5:10 AM      Result Value Ref Range   Sodium 139  137 - 147 mEq/L   Potassium 3.7  3.7 - 5.3 mEq/L   Chloride 102  96 - 112 mEq/L   CO2 21  19 - 32 mEq/L   Glucose, Bld 120 (*) 70 - 99 mg/dL   BUN 27 (*) 6 - 23 mg/dL   Creatinine, Ser 1.17  0.50 - 1.35 mg/dL   Calcium 8.1 (*) 8.4 - 10.5 mg/dL   GFR calc non Af Amer 63 (*) >90 mL/min   GFR calc Af Amer 73 (*) >90 mL/min   Comment: (NOTE)     The eGFR has been calculated using the CKD EPI equation.      This calculation has not been validated in all clinical situations.     eGFR's persistently <90 mL/min signify possible Chronic Kidney     Disease.  HEMOGLOBIN AND HEMATOCRIT, BLOOD     Status: Abnormal   Collection Time    07/29/13  4:10 PM      Result Value Ref Range   Hemoglobin 8.1 (*) 13.0 - 17.0 g/dL   HCT 25.0 (*) 39.0 - 52.0 %  CBC     Status: Abnormal   Collection Time    07/30/13  5:00 AM      Result Value Ref Range   WBC 11.7 (*) 4.0 - 10.5 K/uL   RBC 2.65 (*) 4.22 - 5.81 MIL/uL   Hemoglobin 7.7 (*) 13.0 - 17.0 g/dL   HCT 24.4 (*) 39.0 - 52.0 %   MCV 92.1  78.0 - 100.0 fL   MCH 29.1  26.0 - 34.0 pg  MCHC 31.6  30.0 - 36.0 g/dL   RDW 16.6 (*) 11.5 - 15.5 %   Platelets 429 (*) 150 - 400 K/uL  COMPREHENSIVE METABOLIC PANEL     Status: Abnormal   Collection Time    07/30/13  5:00 AM      Result Value Ref Range   Sodium 140  137 - 147 mEq/L   Potassium 4.2  3.7 - 5.3 mEq/L   Chloride 104  96 - 112 mEq/L   CO2 25  19 - 32 mEq/L   Glucose, Bld 116 (*) 70 - 99 mg/dL   BUN 21  6 - 23 mg/dL   Creatinine, Ser 1.09  0.50 - 1.35 mg/dL   Calcium 8.0 (*) 8.4 - 10.5 mg/dL   Total Protein 5.6 (*) 6.0 - 8.3 g/dL   Albumin 2.1 (*) 3.5 - 5.2 g/dL   AST 221 (*) 0 - 37 U/L   ALT 223 (*) 0 - 53 U/L   Alkaline Phosphatase 403 (*) 39 - 117 U/L   Total Bilirubin 1.0  0.3 - 1.2 mg/dL   GFR calc non Af Amer 68 (*) >90 mL/min   GFR calc Af Amer 79 (*) >90 mL/min   Comment: (NOTE)     The eGFR has been calculated using the CKD EPI equation.     This calculation has not been validated in all clinical situations.     eGFR's persistently <90 mL/min signify possible Chronic Kidney     Disease.  CK     Status: None   Collection Time    07/30/13  5:00 AM      Result Value Ref Range   Total CK 21  7 - 232 U/L  ANA     Status: None   Collection Time    07/30/13 11:45 AM      Result Value Ref Range   ANA NEGATIVE  NEGATIVE   Comment: Performed at [place]: None   Collection Time    07/30/13 11:45 AM      Result Value Ref Range   Mitochondrial M2 Ab, IgG 0.54  <0.91   Comment: (NOTE)     Result        Interpretation     ---------     -----------------------------------------------     <0.91         Negative     0.91-1.09     Equivocal     >=1.10        Positive     Performed at [facility] Time    07/30/13 11:45 AM      Result Value Ref Range   A-1 Antitrypsin, Ser 328 (*) 83 - 199 mg/dL   Comment: Performed at [facility]     Status: Abnormal   Collection Time    07/30/13 11:45 AM      Result Value Ref Range   Ceruloplasmin 41 (*) 18 - 36 mg/dL   Comment: Performed at [place], BLOOD     Status: Abnormal   Collection Time    07/30/13  8:18 PM      Result Value Ref Range   Hemoglobin 8.1 (*) 13.0 - 17.0 g/dL   HCT 25.5 (*) 39.0 - 52.0 %  TYPE AND SCREEN     Status: None   Collection Time    07/30/13  8:18 PM  Result Value Ref Range   ABO/RH(D) O POS     Antibody Screen NEG     Sample Expiration 08/02/2013     Unit Number [id number]     Blood Component Type RBC LR PHER1     Unit division 00     Status of Unit ISSUED,FINAL     Transfusion Status OK TO TRANSFUSE     Crossmatch Result Compatible     Unit Number [id number]     Blood Component Type RBC LR PHER1     Unit division 00     Status of Unit ISSUED,FINAL     Transfusion Status OK TO TRANSFUSE     Crossmatch Result Compatible    COMPREHENSIVE METABOLIC PANEL     Status: Abnormal   Collection Time    07/31/13  5:10 AM      Result Value Ref Range   Sodium 140  137 - 147 mEq/L   Potassium 3.9  3.7 - 5.3 mEq/L   Chloride 102  96 - 112 mEq/L   CO2 23  19 - 32 mEq/L   Glucose, Bld 119 (*) 70 - 99 mg/dL   BUN 20  6 - 23 mg/dL   Creatinine, Ser 1.00  0.50 - 1.35 mg/dL   Calcium 8.1 (*) 8.4 - 10.5 mg/dL   Total Protein 5.6 (*) 6.0 - 8.3 g/dL    Albumin 2.1 (*) 3.5 - 5.2 g/dL   AST 223 (*) 0 - 37 U/L   ALT 250 (*) 0 - 53 U/L   Alkaline Phosphatase 443 (*) 39 - 117 U/L   Total Bilirubin 1.1  0.3 - 1.2 mg/dL   GFR calc non Af Amer 76 (*) >90 mL/min   GFR calc Af Amer 88 (*) >90 mL/min  CBC     Status: Abnormal   Collection Time    07/31/13  5:10 AM      Result Value Ref Range   WBC 13.2 (*) 4.0 - 10.5 K/uL   RBC 2.62 (*) 4.22 - 5.81 MIL/uL   Hemoglobin 7.6 (*) 13.0 - 17.0 g/dL   HCT 23.6 (*) 39.0 - 52.0 %   MCV 90.1  78.0 - 100.0 fL   MCH 29.0  26.0 - 34.0 pg   MCHC 32.2  30.0 - 36.0 g/dL   RDW 16.7 (*) 11.5 - 15.5 %   Platelets 453 (*) 150 - 400 K/uL  PREPARE RBC (CROSSMATCH)     Status: None   Collection Time    07/31/13 10:13 AM      Result Value Ref Range   Order Confirmation ORDER PROCESSED BY BLOOD BANK    CBC     Status: Abnormal   Collection Time    07/31/13  9:00 PM      Result Value Ref Range   WBC 11.8 (*) 4.0 - 10.5 K/uL   RBC 2.82 (*) 4.22 - 5.81 MIL/uL   Hemoglobin 8.1 (*) 13.0 - 17.0 g/dL   HCT 25.0 (*) 39.0 - 52.0 %   MCV 88.7  78.0 - 100.0 fL   MCH 28.7  26.0 - 34.0 pg   MCHC 32.4  30.0 - 36.0 g/dL   RDW 16.9 (*) 11.5 - 15.5 %   Platelets 430 (*) 150 - 400 K/uL  CBC     Status: Abnormal   Collection Time    08/01/13  8:55 AM      Result Value Ref Range   WBC 11.7 (*) 4.0 - 10.5 K/uL  RBC 3.14 (*) 4.22 - 5.81 MIL/uL   Hemoglobin 8.8 (*) 13.0 - 17.0 g/dL   HCT 28.0 (*) 39.0 - 52.0 %   MCV 89.2  78.0 - 100.0 fL   MCH 28.0  26.0 - 34.0 pg   MCHC 31.4  30.0 - 36.0 g/dL   RDW 17.0 (*) 11.5 - 15.5 %   Platelets 386  150 - 400 K/uL  PREPARE RBC (CROSSMATCH)     Status: None   Collection Time    08/01/13  6:00 PM      Result Value Ref Range   Order Confirmation ORDER PROCESSED BY BLOOD BANK    COMPREHENSIVE METABOLIC PANEL     Status: Abnormal   Collection Time    08/02/13  4:15 AM      Result Value Ref Range   Sodium 143  137 - 147 mEq/L   Potassium 3.5 (*) 3.7 - 5.3 mEq/L   Chloride 103   96 - 112 mEq/L   CO2 27  19 - 32 mEq/L   Glucose, Bld 97  70 - 99 mg/dL   BUN 15  6 - 23 mg/dL   Creatinine, Ser 1.09  0.50 - 1.35 mg/dL   Calcium 8.1 (*) 8.4 - 10.5 mg/dL   Total Protein 5.6 (*) 6.0 - 8.3 g/dL   Albumin 2.1 (*) 3.5 - 5.2 g/dL   AST 56 (*) 0 - 37 U/L   ALT 130 (*) 0 - 53 U/L   Alkaline Phosphatase 362 (*) 39 - 117 U/L   Total Bilirubin 0.6  0.3 - 1.2 mg/dL   GFR calc non Af Amer 68 (*) >90 mL/min   GFR calc Af Amer 79 (*) >90 mL/min   Comment: (NOTE)     The eGFR has been calculated using the CKD EPI equation.     This calculation has not been validated in all clinical situations.     eGFR's persistently <90 mL/min signify possible Chronic Kidney     Disease.  CBC     Status: Abnormal   Collection Time    08/02/13  4:15 AM      Result Value Ref Range   WBC 10.0  4.0 - 10.5 K/uL   RBC 3.18 (*) 4.22 - 5.81 MIL/uL   Hemoglobin 9.0 (*) 13.0 - 17.0 g/dL   HCT 28.2 (*) 39.0 - 52.0 %   MCV 88.7  78.0 - 100.0 fL   MCH 28.3  26.0 - 34.0 pg   MCHC 31.9  30.0 - 36.0 g/dL   RDW 16.8 (*) 11.5 - 15.5 %   Platelets 412 (*) 150 - 400 K/uL  PRO B NATRIURETIC PEPTIDE     Status: Abnormal   Collection Time    08/02/13  4:15 AM      Result Value Ref Range   Pro B Natriuretic peptide (BNP) 2054.0 (*) 0 - 125 pg/mL  COMPREHENSIVE METABOLIC PANEL     Status: Abnormal   Collection Time    08/03/13  8:15 AM      Result Value Ref Range   Sodium 145  137 - 147 mEq/L   Potassium 3.6 (*) 3.7 - 5.3 mEq/L   Chloride 104  96 - 112 mEq/L   CO2 25  19 - 32 mEq/L   Glucose, Bld 122 (*) 70 - 99 mg/dL   BUN 15  6 - 23 mg/dL   Creatinine, Ser 0.98  0.50 - 1.35 mg/dL   Calcium 8.5  8.4 - 10.5 mg/dL  Total Protein 6.3  6.0 - 8.3 g/dL   Albumin 2.3 (*) 3.5 - 5.2 g/dL   AST 56 (*) 0 - 37 U/L   ALT 113 (*) 0 - 53 U/L   Alkaline Phosphatase 368 (*) 39 - 117 U/L   Total Bilirubin 0.5  0.3 - 1.2 mg/dL   GFR calc non Af Amer 83 (*) >90 mL/min   GFR calc Af Amer >90  >90 mL/min   Comment:  (NOTE)     The eGFR has been calculated using the CKD EPI equation.     This calculation has not been validated in all clinical situations.     eGFR's persistently <90 mL/min signify possible Chronic Kidney     Disease.  PRO B NATRIURETIC PEPTIDE     Status: Abnormal   Collection Time    08/03/13  8:15 AM      Result Value Ref Range   Pro B Natriuretic peptide (BNP) 977.7 (*) 0 - 125 pg/mL  CBC     Status: Abnormal   Collection Time    08/03/13  8:15 AM      Result Value Ref Range   WBC 9.2  4.0 - 10.5 K/uL   RBC 3.69 (*) 4.22 - 5.81 MIL/uL   Hemoglobin 10.4 (*) 13.0 - 17.0 g/dL   HCT 32.8 (*) 39.0 - 52.0 %   MCV 88.9  78.0 - 100.0 fL   MCH 28.2  26.0 - 34.0 pg   MCHC 31.7  30.0 - 36.0 g/dL   RDW 16.5 (*) 11.5 - 15.5 %   Platelets 430 (*) 150 - 400 K/uL  MAGNESIUM     Status: None   Collection Time    08/03/13  8:15 AM      Result Value Ref Range   Magnesium 2.2  1.5 - 2.5 mg/dL  BASIC METABOLIC PANEL     Status: Abnormal   Collection Time    08/04/13  5:40 AM      Result Value Ref Range   Sodium 143  137 - 147 mEq/L   Potassium 3.9  3.7 - 5.3 mEq/L   Chloride 103  96 - 112 mEq/L   CO2 29  19 - 32 mEq/L   Glucose, Bld 89  70 - 99 mg/dL   BUN 16  6 - 23 mg/dL   Creatinine, Ser 0.97  0.50 - 1.35 mg/dL   Calcium 8.2 (*) 8.4 - 10.5 mg/dL   GFR calc non Af Amer 84 (*) >90 mL/min   GFR calc Af Amer >90  >90 mL/min   Comment: (NOTE)     The eGFR has been calculated using the CKD EPI equation.     This calculation has not been validated in all clinical situations.     eGFR's persistently <90 mL/min signify possible Chronic Kidney     Disease.  CBC     Status: Abnormal   Collection Time    08/04/13  5:40 AM      Result Value Ref Range   WBC 7.6  4.0 - 10.5 K/uL   RBC 3.21 (*) 4.22 - 5.81 MIL/uL   Hemoglobin 8.9 (*) 13.0 - 17.0 g/dL   HCT 28.9 (*) 39.0 - 52.0 %   MCV 90.0  78.0 - 100.0 fL   MCH 27.7  26.0 - 34.0 pg   MCHC 30.8  30.0 - 36.0 g/dL   RDW 16.3 (*) 11.5 -  15.5 %   Platelets 375  150 - 400 K/uL  BASIC METABOLIC PANEL     Status: None   Collection Time    08/11/13  4:07 PM      Result Value Ref Range   Sodium 141  135 - 145 mEq/L   Potassium 4.1  3.5 - 5.1 mEq/L   Chloride 104  96 - 112 mEq/L   CO2 30  19 - 32 mEq/L   Glucose, Bld 95  70 - 99 mg/dL   BUN 20  6 - 23 mg/dL   Creatinine, Ser 1.1  0.4 - 1.5 mg/dL   Calcium 8.7  8.4 - 10.5 mg/dL   GFR 74.74  >60.00 mL/min  CBC WITH DIFFERENTIAL     Status: Abnormal   Collection Time    08/11/13  4:07 PM      Result Value Ref Range   WBC 9.7  4.0 - 10.5 K/uL   RBC 4.23  4.22 - 5.81 Mil/uL   Hemoglobin 11.8 (*) 13.0 - 17.0 g/dL   HCT 36.5 (*) 39.0 - 52.0 %   MCV 86.3  78.0 - 100.0 fl   MCHC 32.4  30.0 - 36.0 g/dL   RDW 16.7 (*) 11.5 - 15.5 %   Platelets 310.0  150.0 - 400.0 K/uL   Neutrophils Relative % 74.9  43.0 - 77.0 %   Lymphocytes Relative 15.5  12.0 - 46.0 %   Monocytes Relative 8.0  3.0 - 12.0 %   Eosinophils Relative 1.2  0.0 - 5.0 %   Basophils Relative 0.4  0.0 - 3.0 %   Neutro Abs 7.3  1.4 - 7.7 K/uL   Lymphs Abs 1.5  0.7 - 4.0 K/uL   Monocytes Absolute 0.8  0.1 - 1.0 K/uL   Eosinophils Absolute 0.1  0.0 - 0.7 K/uL   Basophils Absolute 0.0  0.0 - 0.1 K/uL  HEPATIC FUNCTION PANEL     Status: Abnormal   Collection Time    08/11/13  4:07 PM      Result Value Ref Range   Total Bilirubin 0.7  0.2 - 1.2 mg/dL   Bilirubin, Direct 0.1  0.0 - 0.3 mg/dL   Alkaline Phosphatase 160 (*) 39 - 117 U/L   AST 20  0 - 37 U/L   ALT 25  0 - 53 U/L   Total Protein 6.3  6.0 - 8.3 g/dL   Albumin 3.0 (*) 3.5 - 5.2 g/dL    Past Psychiatric History/Hospitalization(s) Patient has one psychiatric admission in 2006 due to increased depression and having suicidal thoughts.  Since then patient has been seen in this office and feeling stable on his psychiatric medication. Anxiety: Yes Bipolar Disorder: No Depression: Yes Mania: No Psychosis: No Schizophrenia: No Personality Disorder:  No Hospitalization for psychiatric illness: Yes History of Electroconvulsive Shock Therapy: No Prior Suicide Attempts: No  Physical Exam: Constitutional:  BP 126/67  Pulse 63  Ht 6' (1.829 m)  Wt 294 lb 9.6 oz (133.63 kg)  BMI 39.95 kg/m2  Musculoskeletal: Strength & Muscle Tone: within normal limits Gait & Station: normal Patient leans: N/A  Mental Status Examination;  Patient is casually dressed and fairly groomed.  He appears very tired but maintain good eye contact. He is pleasant and cooperative.  His thought process is logical linear and goal directed.  There were no flight if ideas of any loose association.  He denies any active or passive suicidal thoughts and homicidal thoughts.  His fund of knowledge is adequate.  There were no paranoia or delusions present at this  time.  His attention concentration is fair.  He is alert and oriented x3.  He described his mood as okay and his affect is mood appropriate.  His insight judgment and impulse control is okay.   Established Problem, Stable/Improving (1), Review of Psycho-Social Stressors (1), Review or order clinical lab tests (1), Review and summation of old records (2), Review of Last Therapy Session (1) and Review of Medication Regimen & Side Effects (2)  Assessment: Axis I: Maj. depressive disorder, recurrent  Axis II: Deferred  Axis III:  Past Medical History  Diagnosis Date  . Benign prostatic hypertrophy     several prostate biopsies neg for cancer.   . Hypertension   . Gout   . Depression   . FH: colonic polyps 2010  . Morbid obesity   . Complication of anesthesia     MORPHINE CAUSES HALLUCINATIONS  . Asthma     childhood asthma  . Numbness     feet / legs  . History of kidney stones   . Kidney cysts   . Sleep apnea     USES C PAP  . Bilateral hip pain   . PAF (paroxysmal atrial fibrillation) with RVR 07/29/2013  . Anemia 07/29/2013    Axis IV: Mild   Plan:  I reviewed his discharge summary, current  medication, blood results . His liver enzymes is getting better.  His taking multiple medication and he is scheduled to see his cardiologist.  At this time I will continue his current psychotropic medication which is helping his anxiety and depression.  He does not have any side effects. He is taking Zoloft 150 mg daily, Risperdal 0.5 mg at bedtime and Ativan 0.5 mg 1 in the morning and 2 at bedtime.  Recommend to call us back if he has any questions or concerns otherwise I will see him again in 3 months. Time spent 25 minutes.  More than 50% of the time spent in psychoeducation, counseling and coordination of care.  Discuss safety plan that anytime having active suicidal thoughts or homicidal thoughts then patient need to call 911 or go to the local emergency room.      Colbi Schiltz T., MD 09/03/2013

## 2013-09-04 ENCOUNTER — Ambulatory Visit (INDEPENDENT_AMBULATORY_CARE_PROVIDER_SITE_OTHER): Payer: Medicare Other | Admitting: Physician Assistant

## 2013-09-04 ENCOUNTER — Encounter (INDEPENDENT_AMBULATORY_CARE_PROVIDER_SITE_OTHER): Payer: Self-pay

## 2013-09-04 VITALS — BP 140/80 | HR 72 | Temp 97.8°F | Resp 14 | Ht 72.0 in | Wt 294.2 lb

## 2013-09-04 DIAGNOSIS — Z9884 Bariatric surgery status: Secondary | ICD-10-CM

## 2013-09-04 NOTE — Progress Notes (Signed)
  HISTORY: [name] is a 68 y.o.male who received an AP-Large lap-band in January 2015 by Dr. Redmond Pulling. He comes in with an additional 4 lbs weight loss since his last visit one month ago. He continues to recover from his recent hospitalizations. He continues to feel improvement but notes that he still has far less energy than desired. Some ADLs cause impressive fatigue. He is conscious of his portion sizes and they remain small. He has no complaints of regurgitation or reflux. His current eating regimen satisfies him.  VITAL SIGNS: Filed Vitals:   09/04/13 1025  BP: 140/80  Pulse: 72  Temp: 97.8 F (36.6 C)  Resp: 14    PHYSICAL EXAM: Physical exam reveals a very well-appearing 67 y.o.male in no apparent distress Neurologic: Awake, alert, oriented Psych: Bright affect, conversant Respiratory: Breathing even and unlabored. No stridor or wheezing Extremities: Atraumatic, good range of motion. Skin: Warm, Dry, no rashes Musculoskeletal: Normal gait, Joints normal  ASSESMENT: 68 y.o.  male  s/p AP-Large lap-band.   PLAN: As he continues to lose weight (he's 49 lbs under his pre op weight), with well-controlled hunger and small portion sizes, we deferred a fill today. As his fatigue is still a factor following hospitalization, he should hold off on the BELT program until better. He begins PT next week. We'll have him back in one month or sooner if needed.

## 2013-09-04 NOTE — Patient Instructions (Signed)
Return in one month. Focus on good food choices as well as physical activity. Return sooner if you have an increase in hunger, portion sizes or weight. Return also for difficulty swallowing, night cough, reflux.

## 2013-09-06 ENCOUNTER — Encounter: Payer: Self-pay | Admitting: Internal Medicine

## 2013-09-06 ENCOUNTER — Other Ambulatory Visit: Payer: Self-pay | Admitting: *Deleted

## 2013-09-06 MED ORDER — PANTOPRAZOLE SODIUM 40 MG PO TBEC: 40.0000 mg | DELAYED_RELEASE_TABLET | Freq: Two times a day (BID) | ORAL | Status: DC

## 2013-09-06 MED ORDER — FUROSEMIDE 20 MG PO TABS: 20.0000 mg | ORAL_TABLET | Freq: Every day | ORAL | Status: DC

## 2013-09-06 NOTE — Telephone Encounter (Signed)
Sent email needing refill on his protonix & lasix...lmb

## 2013-09-09 ENCOUNTER — Ambulatory Visit
Admission: RE | Admit: 2013-09-09 | Discharge: 2013-09-09 | Disposition: A | Discharge status: Home / Self Care | Payer: Medicare Other | Source: Ambulatory Visit | Attending: Cardiothoracic Surgery | Admitting: Cardiothoracic Surgery

## 2013-09-09 ENCOUNTER — Ambulatory Visit (INDEPENDENT_AMBULATORY_CARE_PROVIDER_SITE_OTHER): Payer: Self-pay | Admitting: Cardiothoracic Surgery

## 2013-09-09 ENCOUNTER — Other Ambulatory Visit: Payer: Self-pay | Admitting: Cardiothoracic Surgery

## 2013-09-09 ENCOUNTER — Telehealth: Payer: Self-pay

## 2013-09-09 ENCOUNTER — Encounter: Payer: Self-pay | Admitting: Cardiothoracic Surgery

## 2013-09-09 VITALS — BP 120/75 | HR 66 | Resp 20 | Ht 72.0 in | Wt 294.0 lb

## 2013-09-09 DIAGNOSIS — Z09 Encounter for follow-up examination after completed treatment for conditions other than malignant neoplasm: Secondary | ICD-10-CM

## 2013-09-09 DIAGNOSIS — I2699 Other pulmonary embolism without acute cor pulmonale: Secondary | ICD-10-CM

## 2013-09-09 DIAGNOSIS — I3139 Other pericardial effusion (noninflammatory): Secondary | ICD-10-CM

## 2013-09-09 DIAGNOSIS — I319 Disease of pericardium, unspecified: Secondary | ICD-10-CM

## 2013-09-09 DIAGNOSIS — I313 Pericardial effusion (noninflammatory): Secondary | ICD-10-CM

## 2013-09-09 DIAGNOSIS — R9389 Abnormal findings on diagnostic imaging of other specified body structures: Secondary | ICD-10-CM | POA: Diagnosis not present

## 2013-09-09 NOTE — Progress Notes (Signed)
[address]       [place] [address]             [phone]      Marcella A Bocek [place] Medical Record #[id number] Date of Birth: 07-22-1945  Referring: Pixie Casino., MD Primary Care: [name], MD  Chief Complaint:   POST OP FOLLOW UP 07/18/2013  OPERATIVE REPORT  PREOPERATIVE DIAGNOSIS: Pericardial effusion.  POSTOPERATIVE DIAGNOSIS: Pericardial effusion with evidence of diffuse  pericarditis.  PROCEDURE PERFORMED: Subxiphoid pericardial window and drainage of  pericardial effusion.  SURGEON: Lanelle Bal, MD  History of Present Illness:     Since initial drainage of the pericardial effusion, which at the time of surgery really was severe pericarditis with thickened pericardium then a large free-flowing pericardial effusion, the patient has been readmitted to the hospital with a recurrent GI bleed. He notes since his hemoglobin has improved he spelled much better. He's increasing his physical activity to some degree but is still very limited and fatigues quickly. He notes his breathing continues to improve. No fever or chills. He continues on anticoagulation for PE.    Past Medical History  Diagnosis Date  . Benign prostatic hypertrophy     several prostate biopsies neg for cancer.   . Hypertension   . Gout   . Depression   . FH: colonic polyps 2010  . Morbid obesity   . Complication of anesthesia     MORPHINE CAUSES HALLUCINATIONS  . Asthma     childhood asthma  . Numbness     feet / legs  . History of kidney stones   . Kidney cysts   . Sleep apnea     USES C PAP  . Bilateral hip pain   . PAF (paroxysmal atrial fibrillation) with RVR 07/29/2013  . Anemia 07/29/2013     History  Smoking status  . Former Smoker  . Types: Cigarettes  . Quit date: 08/30/1974  Smokeless tobacco  . Not on file    History  Alcohol Use  . Yes    Comment: Occasionally; 1 small drink a week     Allergies  Allergen Reactions  .  Propoxyphene N-Acetaminophen Nausea Only  . Codeine     nausea  . Morphine     Nausea Severe hallucinations  . Penicillins     LOC with shot    Current Outpatient Prescriptions  Medication Sig Dispense Refill  . apixaban (ELIQUIS) 5 MG TABS tablet Take 1 tablet (5 mg total) by mouth 2 (two) times daily.  60 tablet  5  . budesonide-formoterol (SYMBICORT) 160-4.5 MCG/ACT inhaler Inhale 2 puffs into the lungs 2 (two) times daily.  1 Inhaler  3  . Cyanocobalamin (VITAMIN B-12) 1000 MCG SUBL Place 1,000 mcg under the tongue every morning.      [name] Dextromethorphan-Guaifenesin (ROBITUSSIN DM PO) Take 15 mLs by mouth daily as needed (cough).      . docusate sodium 100 MG CAPS Take 100 mg by mouth 2 (two) times daily.  10 capsule  0  . finasteride (PROSCAR) 5 MG tablet Take 5 mg by mouth daily.       . flecainide (TAMBOCOR) 50 MG tablet Take 1 tablet (50 mg total) by mouth every 12 (twelve) hours.  60 tablet  5  . fluticasone (FLONASE) 50 MCG/ACT nasal spray Place 2 sprays into both nostrils daily.  16 g  2  . furosemide (LASIX) 20 MG tablet Take 1 tablet (20  mg total) by mouth daily.  90 tablet  1  . loratadine (CLARITIN) 10 MG tablet Take 1 tablet (10 mg total) by mouth daily.  30 tablet  5  . LORazepam (ATIVAN) 0.5 MG tablet 0.5 mg in the morning and 1 mg at bedtime  90 tablet  2  . metoprolol tartrate (LOPRESSOR) 25 MG tablet Take 0.5 tablets (12.5 mg total) by mouth once.  30 tablet  5  . montelukast (SINGULAIR) 10 MG tablet Take 1 tablet (10 mg total) by mouth at bedtime.  30 tablet  3  . Multiple Vitamin (MULTIVITAMIN) tablet Take 1 tablet by mouth daily. Chewable tablet with iron      . ondansetron (ZOFRAN-ODT) 4 MG disintegrating tablet Take 1 tablet (4 mg total) by mouth every 8 (eight) hours as needed for nausea or vomiting.  20 tablet  0  . pantoprazole (PROTONIX) 40 MG tablet Take 1 tablet (40 mg total) by mouth 2 (two) times daily.  180 tablet  1  . polyethylene glycol (MIRALAX /  GLYCOLAX) packet Take 17 g by mouth daily.  14 each  0  . potassium chloride (K-DUR,KLOR-CON) 10 MEQ tablet Take 1 tablet (10 mEq total) by mouth 3 (three) times daily.  90 tablet  5  . risperiDONE (RISPERDAL) 0.5 MG tablet Take 1 tablet (0.5 mg total) by mouth at bedtime.  30 tablet  2  . sertraline (ZOLOFT) 100 MG tablet Take 1.5 tablets (150 mg total) by mouth daily with breakfast.  45 tablet  2  . Tetrahydrozoline HCl (VISINE OP) Place 2 drops into both eyes daily as needed (dry eyes).       No current facility-administered medications for this visit.       Physical Exam: BP 120/75  Pulse 66  Resp 20  Ht 6' (1.829 m)  Wt 294 lb (133.358 kg)  BMI 39.86 kg/m2  SpO2 97%  General appearance: alert, cooperative, appears older than stated age and no distress Neurologic: intact Heart: regular rate and rhythm, S1, S2 normal, no murmur, click, rub or gallop Lungs: clear  breath sounds bilaterial Abdomen: soft, non-tender; bowel sounds normal; no masses,  no organomegaly Extremities: edema Bilateral lower strandy edema, improved from when the patient was seen in the hospital Wound: Subxiphoid pericardial window is well-healed without infection or drainage   Diagnostic Studies & Laboratory data:     Recent Radiology Findings:   Dg Chest 2 View  09/09/2013   CLINICAL DATA:  History of previous pericardial window creation  EXAM: CHEST  2 VIEW  COMPARISON:  Aug 14, 2013  FINDINGS: The pleural effusion on the left has resolved. There is no right pleural effusion. The cardiac silhouette is normal in size. The mediastinum is normal in width. The pulmonary parenchyma is normal. The bony thorax is unremarkable.  IMPRESSION: There is no residual pleural effusion on the left.   Electronically Signed   By: David  Martinique   On: 09/09/2013 10:48      Recent Lab Findings: Lab Results  Component Value Date   WBC 9.7 08/11/2013   HGB 11.8* 08/11/2013   HCT 36.5* 08/11/2013   PLT 310.0 08/11/2013    GLUCOSE 95 08/11/2013   CHOL 138 12/05/2012   TRIG 137 12/05/2012   HDL 34* 12/05/2012   LDLDIRECT 111.6 02/15/2012   LDLCALC 77 12/05/2012   ALT 25 08/11/2013   AST 20 08/11/2013   NA 141 08/11/2013   K 4.1 08/11/2013   CL 104 08/11/2013  CREATININE 1.1 08/11/2013   BUN 20 08/11/2013   CO2 30 08/11/2013   TSH 1.731 12/05/2012   HGBA1C 6.1* 12/05/2012      Assessment / Plan:    Stable after pericardial window with pericardial biopsy, negative for tumor and culture negative, bloody  with significant pericarditis question viral not not cultured Recent diagnosis of pulmonary embolus prior to subxiphoid pericardial window  Small left pleural effusion- resolved on xray today Fu with cardiology I will see back as needed per cardiology  Grace Isaac MD      [address] [place] [address] Office [phone]   Beeper [phone]  09/09/2013 11:31 AM

## 2013-09-09 NOTE — Telephone Encounter (Signed)
Per Dr [name], phone call to patient to advise him his insurance is not covering Symbicort. If he is still having respiratory issues a pulmonary consult is recommended. Patient states he is feeling better.

## 2013-09-11 ENCOUNTER — Ambulatory Visit: Payer: Self-pay | Admitting: Cardiothoracic Surgery

## 2013-09-29 ENCOUNTER — Encounter: Payer: Self-pay | Admitting: Internal Medicine

## 2013-09-29 ENCOUNTER — Ambulatory Visit (INDEPENDENT_AMBULATORY_CARE_PROVIDER_SITE_OTHER): Payer: Medicare Other | Admitting: Internal Medicine

## 2013-09-29 VITALS — BP 134/80 | HR 65 | Ht 72.0 in | Wt 300.3 lb

## 2013-09-29 DIAGNOSIS — G4730 Sleep apnea, unspecified: Secondary | ICD-10-CM

## 2013-09-29 DIAGNOSIS — I480 Paroxysmal atrial fibrillation: Secondary | ICD-10-CM

## 2013-09-29 DIAGNOSIS — I2699 Other pulmonary embolism without acute cor pulmonale: Secondary | ICD-10-CM | POA: Diagnosis not present

## 2013-09-29 DIAGNOSIS — I3139 Other pericardial effusion (noninflammatory): Secondary | ICD-10-CM

## 2013-09-29 DIAGNOSIS — I313 Pericardial effusion (noninflammatory): Secondary | ICD-10-CM

## 2013-09-29 DIAGNOSIS — I4891 Unspecified atrial fibrillation: Secondary | ICD-10-CM

## 2013-09-29 DIAGNOSIS — I10 Essential (primary) hypertension: Secondary | ICD-10-CM

## 2013-09-29 DIAGNOSIS — E785 Hyperlipidemia, unspecified: Secondary | ICD-10-CM

## 2013-09-29 DIAGNOSIS — I319 Disease of pericardium, unspecified: Secondary | ICD-10-CM

## 2013-09-29 NOTE — Patient Instructions (Signed)
Continue current medications.  Your physician recommends that you schedule a follow-up appointment in: 6 months with Dr. Debara Pickett.

## 2013-09-29 NOTE — Progress Notes (Signed)
OFFICE NOTE  Chief Complaint:  Routine follow-up  Primary Care Physician: [name], MD  HPI:  [name] is a 68 y/o male with a history of bariatric surgery, one year ago, lost about 85 pounds before and after surgery. Also has a history of hypertension and sleep apnea. He has been admitted twice in the last month with an unusual presentation with multiple medical problems, including pulmonary embolus, moderate to large pericardial effusion, new onset a-fib and GI bleed. His PE was diagnosed by his PCP through outpatient w/u and he was placed on Xarelto. His first hospital admission was from 4/15-4/24. At that time his CC was chest pain. W/u suggested a diagnosis of pericarditis. An Echocardiogram was done, which showed moderate to large pericardial effusion. Meanwhile the night of 4/16 he went in to afib with RVR, probably secondary to PE and was transferred to step down and started on cardizem drip. He converted to sinus the same night , cardizem discontinued and he was resumed on metoprolol. CT surgery was consulted for pericardial window. He underwent pericardial window on 4/17 and hemorrhagic fluid was drained and was sent for analysis. Gram stain and cultures were negative. He was also started on colchicine for his pericarditis. The evening of 4/20 pt went back in to afib with RVR and had bloody bowel movement. His xarelto was discontinued and he was put on cardizem gtt. Over the next 24 hours his hemoglobin dropped to 7.8 and he received 2 units of prbc transfusion. His repeat H&H improved. Dr Henrene Pastor was consulted from GI on 4/20 and he underwent endoscopy on 4/21 . He was found to have superficial ulcers in the stomach, underwent biopsy of the area. He was recommended to continue on PPI twice daily for 2 weeks and resume xarelto. He was discharged home then returned 3 days later on 07/28/13 with a complaint of increased fatigue and SOB. Repeat 2D echo demonstrated only minimal  residual effusion with normal RV and LV function. 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 He was admitted and underwent a repeat EGD which showed healing of ulcers, no bleed. He was continued on PO iron and a PPI. His Hgb at time of discharge was stable at 9.0. He was discharged home. Cardiology had recommended an OP stress test. This was done 08/03/13 and showed normal myocardial perfusion and normal LV EF of 58%.  He returns today and has reported a marked improvement in his shortness of breath. He is able to do most activities without any difficulty. He continues on Eliquis for his pulmonary embolus and for stroke prevention. He is on flecainide for antiarrhythmic therapy. He denies any chest pain.  PMHx:  Past Medical History  Diagnosis Date  . Benign prostatic hypertrophy     several prostate biopsies neg for cancer.   . Hypertension   . Gout   . Depression   . FH: colonic polyps 2010  . Morbid obesity   . Complication of anesthesia     MORPHINE CAUSES HALLUCINATIONS  . Asthma     childhood asthma  . Numbness     feet / legs  . History of kidney stones   . Kidney cysts   . Sleep apnea     USES C PAP  . Bilateral hip pain   . PAF (paroxysmal atrial fibrillation) with RVR 07/29/2013  . Anemia 07/29/2013    Past Surgical History  Procedure Laterality Date  . Total knee replacment  bilat  . Cholecystectomy  1989  . Trigger finger repair      also lue, cts surgically repaired  . Renal calculi    . Tonsillectomy    . Lithotripsy    . Arthroscopy knee w/ drilling    . Breath tek h pylori N/A 12/23/2012    Procedure: BREATH TEK H PYLORI;  Surgeon: Gayland Curry, MD;  Location: [facility];  Service: General;  Laterality: N/A;  . Wisdom tooth extraction    . Laparoscopic gastric banding with hiatal hernia repair N/A 04/08/2013    Procedure: LAPAROSCOPIC GASTRIC BANDING WITH possible  HIATAL HERNIA REPAIR;  Surgeon: Gayland Curry, MD;  Location: [facility];  Service:  General;  Laterality: N/A;  . Subxyphoid pericardial window N/A 07/18/2013    Procedure: SUBXYPHOID PERICARDIAL WINDOW;  Surgeon: Grace Isaac, MD;  Location: [place];  Service: Thoracic;  Laterality: N/A;  . Intraoperative transesophageal echocardiogram N/A 07/18/2013    Procedure: INTRAOPERATIVE TRANSESOPHAGEAL ECHOCARDIOGRAM;  Surgeon: Grace Isaac, MD;  Location: [place];  Service: Open Heart Surgery;  Laterality: N/A;  . Esophagogastroduodenoscopy N/A 07/22/2013    Procedure: ESOPHAGOGASTRODUODENOSCOPY (EGD);  Surgeon: Irene Shipper, MD;  Location: [facility];  Service: Endoscopy;  Laterality: N/A;  . Colonoscopy N/A 07/31/2013    Procedure: COLONOSCOPY;  Surgeon: Gatha Mayer, MD;  Location: [place];  Service: Endoscopy;  Laterality: N/A;  . Esophagogastroduodenoscopy N/A 07/31/2013    Procedure: ESOPHAGOGASTRODUODENOSCOPY (EGD);  Surgeon: Gatha Mayer, MD;  Location: [facility] END[facility]COPY;  Service: Endoscopy;  Laterality: N/A;    FAMHx:  Family History  Problem Relation Age of Onset  . Depression Mother   . Hypertension Mother   . Heart disease Father     MI  . Depression Brother   . Stroke Paternal Aunt     CVA  . Diabetes Paternal Uncle   . Heart disease Paternal Uncle     MI  . Heart disease Paternal Grandmother     MI  . Diabetes Cousin     PATERNAL    SOCHx:   reports that he quit smoking about 39 years ago. His smoking use included Cigarettes. He smoked 0.00 packs per day. He does not have any smokeless tobacco history on file. He reports that he drinks alcohol. He reports that he does not use illicit drugs.  ALLERGIES:  Allergies  Allergen Reactions  . Propoxyphene N-Acetaminophen Nausea Only  . Codeine     nausea  . Morphine     Nausea Severe hallucinations  . Penicillins     LOC with shot    ROS: A comprehensive review of systems was negative except for: Respiratory: positive for dyspnea on exertion  HOME MEDS: Current Outpatient Prescriptions    Medication Sig Dispense Refill  . apixaban (ELIQUIS) 5 MG TABS tablet Take 1 tablet (5 mg total) by mouth 2 (two) times daily.  60 tablet  5  . budesonide-formoterol (SYMBICORT) 160-4.5 MCG/ACT inhaler Inhale 2 puffs into the lungs 2 (two) times daily.  1 Inhaler  3  . Cyanocobalamin (VITAMIN B-12) 1000 MCG SUBL Place 1,000 mcg under the tongue every morning.      [name] Dextromethorphan-Guaifenesin (ROBITUSSIN DM PO) Take 15 mLs by mouth daily as needed (cough).      . docusate sodium 100 MG CAPS Take 100 mg by mouth 2 (two) times daily.  10 capsule  0  . finasteride (PROSCAR) 5 MG tablet Take 5 mg by mouth daily.       [name]  flecainide (TAMBOCOR) 50 MG tablet Take 1 tablet (50 mg total) by mouth every 12 (twelve) hours.  60 tablet  5  . fluticasone (FLONASE) 50 MCG/ACT nasal spray Place 2 sprays into both nostrils daily.  16 g  2  . furosemide (LASIX) 20 MG tablet Take 1 tablet (20 mg total) by mouth daily.  90 tablet  1  . loratadine (CLARITIN) 10 MG tablet Take 1 tablet (10 mg total) by mouth daily.  30 tablet  5  . LORazepam (ATIVAN) 0.5 MG tablet 0.5 mg in the morning and 1 mg at bedtime  90 tablet  2  . metoprolol tartrate (LOPRESSOR) 25 MG tablet Take 0.5 tablets (12.5 mg total) by mouth once.  30 tablet  5  . montelukast (SINGULAIR) 10 MG tablet Take 1 tablet (10 mg total) by mouth at bedtime.  30 tablet  3  . Multiple Vitamin (MULTIVITAMIN) tablet Take 1 tablet by mouth daily. Chewable tablet with iron      . ondansetron (ZOFRAN-ODT) 4 MG disintegrating tablet Take 1 tablet (4 mg total) by mouth every 8 (eight) hours as needed for nausea or vomiting.  20 tablet  0  . pantoprazole (PROTONIX) 40 MG tablet Take 1 tablet (40 mg total) by mouth 2 (two) times daily.  180 tablet  1  . polyethylene glycol (MIRALAX / GLYCOLAX) packet Take 17 g by mouth daily.  14 each  0  . potassium chloride (K-DUR,KLOR-CON) 10 MEQ tablet Take 1 tablet (10 mEq total) by mouth 3 (three) times daily.  90 tablet  5  .  risperiDONE (RISPERDAL) 0.5 MG tablet Take 1 tablet (0.5 mg total) by mouth at bedtime.  30 tablet  2  . sertraline (ZOLOFT) 100 MG tablet Take 1.5 tablets (150 mg total) by mouth daily with breakfast.  45 tablet  2  . Tetrahydrozoline HCl (VISINE OP) Place 2 drops into both eyes daily as needed (dry eyes).       No current facility-administered medications for this visit.    LABS/IMAGING: No results found for this or any previous visit (from the past 48 hour(s)). No results found.  VITALS: BP 134/80  Pulse 65  Ht 6' (1.829 m)  Wt 300 lb 4.8 oz (136.215 kg)  BMI 40.72 kg/m2  EXAM: General appearance: alert and no distress Neck: no carotid bruit and no JVD Lungs: clear to auscultation bilaterally Heart: regular rate and rhythm, S1, S2 normal, no murmur, click, rub or gallop Abdomen: soft, non-tender; bowel sounds normal; no masses,  no organomegaly and obese Extremities: extremities normal, atraumatic, no cyanosis or edema Pulses: 2+ and symmetric Skin: Skin color, texture, turgor normal. No rashes or lesions Neurologic: Grossly normal Psych: Normal  EKG: Deferred  ASSESSMENT: 1. Paroxysmal atrial fibrillation on flecainide 2. Hyperlipidemia 3. Hypertension 4. Recent history of pericarditis/pericardial effusion status post pericardial window 5. Pulmonary embolus 6. Morbid obesity 7. Obstructive sleep apnea on CPAP  PLAN: 1.   [name] is now feeling better without the shortness of breath that he was feeling earlier. He has not noted any recurrent PAF which may been related to his pericardial effusion. Etiology of this effusion is unknown but may be viral as he did have an episode prior to this which was a viral syndrome. His blood pressure is well controlled and cholesterol is improved. He is on Eliquis for his pulmonary embolus and as well as stroke prevention secondary to PAF. He may need to be on this long-term. He continues to use  CPAP and is to work on weight loss  again now that his energy level has improved. Plan to see him back in 6 months.  Pixie Casino, MD, [facility] Attending Cardiologist [facility]  [name],Kenneth C 09/29/2013, 12:53 PM

## 2013-10-02 ENCOUNTER — Ambulatory Visit (INDEPENDENT_AMBULATORY_CARE_PROVIDER_SITE_OTHER): Payer: Medicare Other | Admitting: Physician Assistant

## 2013-10-02 ENCOUNTER — Encounter (INDEPENDENT_AMBULATORY_CARE_PROVIDER_SITE_OTHER): Payer: Self-pay

## 2013-10-02 VITALS — BP 135/85 | HR 74 | Temp 97.1°F | Resp 16 | Ht 72.0 in | Wt 299.6 lb

## 2013-10-02 DIAGNOSIS — Z4651 Encounter for fitting and adjustment of gastric lap band: Secondary | ICD-10-CM

## 2013-10-02 NOTE — Progress Notes (Signed)
  HISTORY: [name] is a 68 y.o.male who received an AP-Large lap-band in January 2015 by Dr. Redmond Pulling. He comes in with 5 lbs weight gain since his last visit, with 44 lbs weight loss since surgery. He changed his diet since discharge from hospital as his energy level was very low. He reports feeling much better today, and received clearance from his cardiologist to resume his lap band diet. He continues to have no issues with regurgitation or reflux.[name]  VITAL SIGNS: Filed Vitals:   10/02/13 0932  BP: 135/85  Pulse: 74  Temp: 97.1 F (36.2 C)  Resp: 16    PHYSICAL EXAM: Physical exam reveals a very well-appearing 67 y.o.male in no apparent distress Neurologic: Awake, alert, oriented Psych: Bright affect, conversant Respiratory: Breathing even and unlabored. No stridor or wheezing Abdomen: Soft, nontender, nondistended to palpation. Incisions well-healed. No incisional hernias. Port easily palpated. Extremities: Atraumatic, good range of motion.  ASSESMENT: 68 y.o.  male  s/p AP-large lap-band.   PLAN: With his first weight gain since surgery (likely due to going 'off-diet'), it appears that today would be ideal for his first fill. The patient's port was accessed with a 20G Huber needle without difficulty. Clear fluid was aspirated and 2 mL saline was added to the port. The patient was able to swallow water without difficulty following the procedure and was instructed to take clear liquids for the next 24-48 hours and advance slowly as tolerated. We'll have him back in one month.

## 2013-10-02 NOTE — Patient Instructions (Signed)

## 2013-10-08 ENCOUNTER — Encounter: Payer: Medicare Other | Attending: General Surgery | Admitting: Dietician

## 2013-10-08 DIAGNOSIS — Z9884 Bariatric surgery status: Secondary | ICD-10-CM | POA: Insufficient documentation

## 2013-10-08 DIAGNOSIS — Z09 Encounter for follow-up examination after completed treatment for conditions other than malignant neoplasm: Secondary | ICD-10-CM | POA: Diagnosis not present

## 2013-10-08 DIAGNOSIS — Z713 Dietary counseling and surveillance: Secondary | ICD-10-CM | POA: Diagnosis not present

## 2013-10-08 NOTE — Patient Instructions (Addendum)
-  Call physician about resuming Calcium -Check into cardiac rehab exercise program  -Eat protein foods first   TANITA BODY COMP RESULTS   04/22/13  05/23/13 07/09/13 10/08/13  BMI (kg/m^2)  52.1  43.1 41.4 40.3  Fat Mass (lbs)  179.0  115.5 109 108.5  Fat Free Mass (lbs)  143.5  202.5 196.5 189  Total Body Water (lbs)  105.0  148 144 138.5

## 2013-10-08 NOTE — Progress Notes (Signed)
  Follow-up visit:  10 Weeks Post-Operative LAGB Surgery  Medical Nutrition Therapy:  Appt start time: 1100 end time:  1130.  Primary concerns today: Post-operative Bariatric Surgery Nutrition Management.  [name] returns today having lost 8 pounds, the majority of which is water. He reports he was recently in cardiac care for blood clots, pericarditis, and CHF. Demari states that he has not been diligent about following his diet as a result. He received his first Lap Band fill last Thursday (2cc). He states that he was advised to discontinue his calcium supplements; he says this was related to abnormal liver function.  Surgery date: 04/08/2013  Surgery type: LAGB  Starting weight at [facility]: 360 lbs on 08/29/12  Weight today: 297.5 lbs Goal: 190s  Weight change: 8 lbs  Total weight lost: 62.5 lbs  TANITA BODY COMP RESULTS   04/22/13  05/23/13 07/09/13 10/08/13  BMI (kg/m^2)  52.1  43.1 41.4 40.3  Fat Mass (lbs)  179.0  115.5 109 108.5  Fat Free Mass (lbs)  143.5  202.5 196.5 189  Total Body Water (lbs)  105.0  148 144 138.5     Preferred Learning Style:   No preference indicated   Learning Readiness:  Ready  24-hr recall: B (AM): Premier protein shake (30g); 1-2 cups of coffee Snk (AM): none  L (PM): burger patty or eggs and bacon or grilled chicken Snk (PM): strawberries D (PM): Premier protein shake (30g) and more coffee Snk (PM):   Fluid intake: drinking constantly, water, decaf coffee with cream, protein shakes (at least 64 oz)  Estimated total protein intake: 80-90 per patient report  Medications: see list Supplementation: taking; not taking Calcium per doctor request  Using straws: yes  Drinking while eating: no; sometimes forgets Hair loss: loose teeth improving Carbonated beverages: no; had a few light beers on vacation N/V/D/C: none Last Lap-Band fill: 2cc on 10/02/2013 (first fill)  Recent physical activity:  None recently  Progress Towards Goal(s):  In  progress.   Handouts given during visit include:  Phase 3B protein + non starchy vegetables   Nutritional Diagnosis:  [place]-3.3 Overweight/obesity related to past poor dietary habits and physical inactivity as evidenced by patient w/ recent LAGB surgery following dietary guidelines for continued weight loss.    Intervention:  Nutrition education provided.  Teaching Method Utilized:  Visual Auditory  Barriers to learning/adherence to lifestyle change: none  Demonstrated degree of understanding via:  Teach Back   Monitoring/Evaluation:  Dietary intake, exercise, lap band fills, and body weight. Follow up in 1 months for 7 month post-op visit.

## 2013-10-29 ENCOUNTER — Telehealth: Payer: Self-pay | Admitting: *Deleted

## 2013-10-29 NOTE — Telephone Encounter (Signed)
Faxed orders for [facility] - maintenance rehab program referral

## 2013-10-29 NOTE — Telephone Encounter (Signed)
Faxed order for referral for cardiac rehab (maintainance program) - "does not qualify" - h/o pericarditis, PAF, normal EF

## 2013-11-06 ENCOUNTER — Ambulatory Visit (INDEPENDENT_AMBULATORY_CARE_PROVIDER_SITE_OTHER): Payer: Medicare Other | Admitting: Physician Assistant

## 2013-11-06 ENCOUNTER — Encounter (INDEPENDENT_AMBULATORY_CARE_PROVIDER_SITE_OTHER): Payer: Self-pay

## 2013-11-06 DIAGNOSIS — Z9884 Bariatric surgery status: Secondary | ICD-10-CM | POA: Diagnosis not present

## 2013-11-06 NOTE — Patient Instructions (Signed)
Return in one month. Focus on good food choices as well as physical activity. Return sooner if you have an increase in hunger, portion sizes or weight. Return also for difficulty swallowing, night cough, reflux.

## 2013-11-06 NOTE — Progress Notes (Signed)
  HISTORY: [name] is a 68 y.o.male who received an AP-Large lap-band in January 2015 by Dr. Redmond Pulling. The patient has maintained his weight since their last visit in July, and has lost 44 lbs since surgery. He has had quite a month. After recovering from pericarditis and related problems earlier in the year, his wife had an MI in mid-July yielding a CABG and several days hospitalization. He is now taking care of her in their son's home. His level of stress is quite high. Fortunately, he does not tend to stress eat but because of all that's gone on, his ability to control his food choices has become fairly limited. He had a few episodes of regurgitation just after his last fill, which he attributed to overeating. That has resolved.  VITAL SIGNS: Filed Vitals:   11/06/13 0845  BP: 126/80  Pulse: 72  Temp: 98.6 F (37 C)  Resp: 14    PHYSICAL EXAM: Physical exam reveals a very well-appearing 67 y.o.male in no apparent distress Neurologic: Awake, alert, oriented Psych: Bright affect, conversant Respiratory: Breathing even and unlabored. No stridor or wheezing Extremities: Atraumatic, good range of motion. Skin: Warm, Dry, no rashes Musculoskeletal: Normal gait, Joints normal  ASSESMENT: 68 y.o.  male  s/p AP-Large lap-band.   PLAN: Given his current situation, we deferred a fill this morning. I did congratulate him on at least maintaining his weight and not gaining. We opted to continue with the current fill volume then re-evaluate in one month for a fill, in the hopes that he's able to settle into more of a routine and in turn, control his diet better.

## 2013-11-19 ENCOUNTER — Encounter: Payer: Medicare Other | Attending: General Surgery | Admitting: Dietician

## 2013-11-19 DIAGNOSIS — Z9884 Bariatric surgery status: Secondary | ICD-10-CM | POA: Diagnosis not present

## 2013-11-19 DIAGNOSIS — Z713 Dietary counseling and surveillance: Secondary | ICD-10-CM | POA: Insufficient documentation

## 2013-11-19 NOTE — Patient Instructions (Addendum)
-  Try eliminating bread from burgers  -Other protein foods to try: cheese roll up with deli meat, fish, chili, boiled eggs

## 2013-11-19 NOTE — Progress Notes (Signed)
  Follow-up visit: 7 months Post-Operative LAGB Surgery  Medical Nutrition Therapy:  Appt start time: 6440 end time:  3474.  Primary concerns today: Post-operative Bariatric Surgery Nutrition Management.  [name] returns today having maintained his weight and is meeting his fluid and protein needs. He reports significant family stress. As he recovers from his hospitalization due to pericarditis, his wife had an MI in July and he is currently caring for her full time. Additionally, he has an elderly mother who lives in a nursing home. [name] admits that his weight has not been his priority recently. He hopes to resume his weight loss plan when his wife regains some independence.  Surgery date: 04/08/2013  Surgery type: LAGB  Starting weight at [facility]: 360 lbs on 08/29/12  Weight today: 297.5 lbs Weight change: 0 lbs  Total weight lost: 62.5 lbs  Goal: 190s   TANITA BODY COMP RESULTS   04/22/13  05/23/13 07/09/13 10/08/13 11/19/13  BMI (kg/m^2)  52.1  43.1 41.4 40.3 40.3  Fat Mass (lbs)  179.0  115.5 109 108.5 108.5  Fat Free Mass (lbs)  143.5  202.5 196.5 189 189  Total Body Water (lbs)  105.0  148 144 138.5 138.5     Preferred Learning Style:   No preference indicated   Learning Readiness:  Ready  24-hr recall: B (AM): Premier protein shake (30g); 1-2 cups of coffee Snk (AM): none  L (PM): burger patty or tuna or grilled chicken (14g) Snk (PM): Premier protein bar (17g) D (PM): Premier protein shake (30g) or grilled chicken (14g) Snk (PM):   Fluid intake: drinking constantly, water, decaf coffee with cream, protein shakes (at least 64 oz)  Estimated total protein intake: 80-90g per patient report  Medications: see list Supplementation: taking; resumed Calcium  Using straws: yes  Drinking while eating: occasionally; if food gets "stuck" Hair loss: loose teeth improved Carbonated beverages: occasional light beer N/V/D/C: vomited 2x last week from overeating Last Lap-Band fill:  2cc on 10/02/2013 (first fill)  Recent physical activity:  None recently; caring for wife  Progress Towards Goal(s):  In progress.    Nutritional Diagnosis:  [place]-3.3 Overweight/obesity related to past poor dietary habits and physical inactivity as evidenced by patient w/ recent LAGB surgery following dietary guidelines for continued weight loss.    Intervention:  Nutrition education provided.  Teaching Method Utilized:  Visual Auditory  Barriers to learning/adherence to lifestyle change: none  Demonstrated degree of understanding via:  Teach Back   Monitoring/Evaluation:  Dietary intake, exercise, lap band fills, and body weight. Follow up in 6 weeks for 8.5 month post-op visit.

## 2013-11-21 DIAGNOSIS — N400 Enlarged prostate without lower urinary tract symptoms: Secondary | ICD-10-CM | POA: Diagnosis not present

## 2013-11-21 DIAGNOSIS — R972 Elevated prostate specific antigen [PSA]: Secondary | ICD-10-CM | POA: Diagnosis not present

## 2013-11-22 LAB — PSA: PSA: 0.49

## 2013-12-02 DIAGNOSIS — N200 Calculus of kidney: Secondary | ICD-10-CM | POA: Diagnosis not present

## 2013-12-02 DIAGNOSIS — Q618 Other cystic kidney diseases: Secondary | ICD-10-CM | POA: Diagnosis not present

## 2013-12-02 DIAGNOSIS — N529 Male erectile dysfunction, unspecified: Secondary | ICD-10-CM | POA: Diagnosis not present

## 2013-12-02 DIAGNOSIS — N138 Other obstructive and reflux uropathy: Secondary | ICD-10-CM | POA: Diagnosis not present

## 2013-12-02 DIAGNOSIS — N401 Enlarged prostate with lower urinary tract symptoms: Secondary | ICD-10-CM | POA: Diagnosis not present

## 2013-12-04 ENCOUNTER — Encounter (HOSPITAL_COMMUNITY): Payer: Self-pay | Admitting: Psychiatry

## 2013-12-04 ENCOUNTER — Ambulatory Visit (INDEPENDENT_AMBULATORY_CARE_PROVIDER_SITE_OTHER): Payer: Medicare Other | Admitting: Psychiatry

## 2013-12-04 VITALS — BP 139/72 | HR 53 | Ht 72.0 in | Wt 304.2 lb

## 2013-12-04 DIAGNOSIS — F339 Major depressive disorder, recurrent, unspecified: Secondary | ICD-10-CM

## 2013-12-04 DIAGNOSIS — F330 Major depressive disorder, recurrent, mild: Secondary | ICD-10-CM

## 2013-12-04 MED ORDER — RISPERIDONE 0.5 MG PO TABS: 0.5000 mg | ORAL_TABLET | Freq: Every day | ORAL | Status: DC

## 2013-12-04 MED ORDER — SERTRALINE HCL 100 MG PO TABS: 150.0000 mg | ORAL_TABLET | Freq: Every day | ORAL | Status: DC

## 2013-12-04 MED ORDER — LORAZEPAM 0.5 MG PO TABS
ORAL_TABLET | ORAL | Status: DC
Start: 2013-12-04 — End: 2013-12-15

## 2013-12-04 NOTE — Progress Notes (Signed)
[place] Progress Note  [name] [id number] 68 y.o.  12/04/2013 11:15 AM  Chief Complaint:  Medication management and followup.        History of Present Illness:  [name] came for his followup appointment.  He is taking his medication and denies any side effects.  In July his bypass heart and she required bypass surgery.  Patient was very stressful however he was able to handle the situation.  This year patient was diagnosed with pericarditis, A. fib, increased liver function and he has gone through a lot of stress.  His liver enzymes are getting better.  He is sleeping better.  His wife recently signed up for cardiac rehabilitation.  He has a son who is supportive but also works full time .  Patient trying to keep himself busy and also trying to help his wife.  His energy level is okay.  He denies any crying spells, irritability or any anger.  He sleeping better.  His appetite is okay.  He wants to continue his Zoloft, Ativan and Risperdal.  He has no tremors or shakes.  His vitals are stable.  Suicidal Ideation: No Plan Formed: No Patient has means to carry out plan: No  Homicidal Ideation: No Plan Formed: No Patient has means to carry out plan: No  Review of Systems: Psychiatric: Agitation: No Hallucination: No Depressed Mood: No Insomnia: No Hypersomnia: No Altered Concentration: No Feels Worthless: No Grandiose Ideas: No Belief In Special Powers: No New/Increased Substance Abuse: No Compulsions: No  Neurologic: Headache: No Seizure: No Paresthesias: No  Past Medical History  Diagnosis Date  . Benign prostatic hypertrophy     several prostate biopsies neg for cancer.   . Hypertension   . Gout   . Depression   . FH: colonic polyps 2010  . Morbid obesity   . Complication of anesthesia     MORPHINE CAUSES HALLUCINATIONS  . Asthma     childhood asthma  . Numbness     feet / legs  . History of kidney stones   . Kidney cysts   . Sleep  apnea     USES C PAP  . Bilateral hip pain   . PAF (paroxysmal atrial fibrillation) with RVR 07/29/2013  . Anemia 07/29/2013  Patient see Dr. [name].  Psychosocial History: Patient lives with his wife.  His wife is very supportive.  Outpatient Encounter Prescriptions as of 12/04/2013  Medication Sig  . apixaban (ELIQUIS) 5 MG TABS tablet Take 1 tablet (5 mg total) by mouth 2 (two) times daily.  . budesonide-formoterol (SYMBICORT) 160-4.5 MCG/ACT inhaler Inhale 2 puffs into the lungs 2 (two) times daily.  . Cyanocobalamin (VITAMIN B-12) 1000 MCG SUBL Place 1,000 mcg under the tongue every morning.  [name] Dextromethorphan-Guaifenesin (ROBITUSSIN DM PO) Take 15 mLs by mouth daily as needed (cough).  . docusate sodium 100 MG CAPS Take 100 mg by mouth 2 (two) times daily.  . finasteride (PROSCAR) 5 MG tablet Take 5 mg by mouth daily.   . flecainide (TAMBOCOR) 50 MG tablet Take 1 tablet (50 mg total) by mouth every 12 (twelve) hours.  . fluticasone (FLONASE) 50 MCG/ACT nasal spray Place 2 sprays into both nostrils daily.  . furosemide (LASIX) 20 MG tablet Take 1 tablet (20 mg total) by mouth daily.  [name] loratadine (CLARITIN) 10 MG tablet Take 1 tablet (10 mg total) by mouth daily.  [name] LORazepam (ATIVAN) 0.5 MG tablet 0.5 mg in the morning and 1 mg at bedtime  .  metoprolol tartrate (LOPRESSOR) 25 MG tablet Take 0.5 tablets (12.5 mg total) by mouth once.  . montelukast (SINGULAIR) 10 MG tablet Take 1 tablet (10 mg total) by mouth at bedtime.  . Multiple Vitamin (MULTIVITAMIN) tablet Take 1 tablet by mouth daily. Chewable tablet with iron  . ondansetron (ZOFRAN-ODT) 4 MG disintegrating tablet Take 1 tablet (4 mg total) by mouth every 8 (eight) hours as needed for nausea or vomiting.  . pantoprazole (PROTONIX) 40 MG tablet Take 1 tablet (40 mg total) by mouth 2 (two) times daily.  . polyethylene glycol (MIRALAX / GLYCOLAX) packet Take 17 g by mouth daily.  . potassium chloride (K-DUR,KLOR-CON) 10 MEQ tablet  Take 1 tablet (10 mEq total) by mouth 3 (three) times daily.  . risperiDONE (RISPERDAL) 0.5 MG tablet Take 1 tablet (0.5 mg total) by mouth at bedtime.  . sertraline (ZOLOFT) 100 MG tablet Take 1.5 tablets (150 mg total) by mouth daily with breakfast.  . Tetrahydrozoline HCl (VISINE OP) Place 2 drops into both eyes daily as needed (dry eyes).  . [DISCONTINUED] LORazepam (ATIVAN) 0.5 MG tablet 0.5 mg in the morning and 1 mg at bedtime  . [DISCONTINUED] risperiDONE (RISPERDAL) 0.5 MG tablet Take 1 tablet (0.5 mg total) by mouth at bedtime.  . [DISCONTINUED] sertraline (ZOLOFT) 100 MG tablet Take 1.5 tablets (150 mg total) by mouth daily with breakfast.    No results found for this or any previous visit (from the past 2160 hour(s)).  Past Psychiatric History/Hospitalization(s) Patient has one psychiatric admission in 2006 due to increased depression and having suicidal thoughts.  Since then patient has been seen in this office and feeling stable on his psychiatric medication. Anxiety: Yes Bipolar Disorder: No Depression: Yes Mania: No Psychosis: No Schizophrenia: No Personality Disorder: No Hospitalization for psychiatric illness: Yes History of Electroconvulsive Shock Therapy: No Prior Suicide Attempts: No  Physical Exam: Constitutional:  BP 139/72  Pulse 53  Ht 6' (1.829 m)  Wt 304 lb 3.2 oz (137.984 kg)  BMI 41.25 kg/m2  Musculoskeletal: Strength & Muscle Tone: within normal limits Gait & Station: normal Patient leans: N/A  Mental Status Examination;  Patient is casually dressed and fairly groomed.  He is tired but maintain good eye contact. He is pleasant and cooperative.  His thought process is logical linear and goal directed.  There were no flight if ideas of any loose association.  He denies any active or passive suicidal thoughts and homicidal thoughts.  His fund of knowledge is adequate.  There were no paranoia or delusions present at this time.  His attention  concentration is fair.  He is alert and oriented x3.  He described his mood as okay and his affect is mood appropriate.  His insight judgment and impulse control is okay.   Established Problem, Stable/Improving (1), Review of Psycho-Social Stressors (1), Review of Last Therapy Session (1) and Review of Medication Regimen & Side Effects (2)  Assessment: Axis I: Maj. depressive disorder, recurrent  Axis II: Deferred  Axis III:  Past Medical History  Diagnosis Date  . Benign prostatic hypertrophy     several prostate biopsies neg for cancer.   . Hypertension   . Gout   . Depression   . FH: colonic polyps 2010  . Morbid obesity   . Complication of anesthesia     MORPHINE CAUSES HALLUCINATIONS  . Asthma     childhood asthma  . Numbness     feet / legs  . History of kidney stones   .  Kidney cysts   . Sleep apnea     USES C PAP  . Bilateral hip pain   . PAF (paroxysmal atrial fibrillation) with RVR 07/29/2013  . Anemia 07/29/2013    Axis IV: Mild   Plan:   reassurance given.  Recommended counseling but patient does not feel he requires counseling at this time.  Continue Zoloft 150 mg daily, Risperdal 0.5 mg at bedtime and Ativan 0.5 mg in the morning and 2 tablets at bedtime.  Discuss risks and benefits of the medication.   Recommend to call us back if he has any questions or concerns otherwise I will see him again in 3 months.    Marlyne Totaro T., MD 12/04/2013

## 2013-12-10 ENCOUNTER — Encounter (HOSPITAL_COMMUNITY)
Admission: RE | Admit: 2013-12-10 | Discharge: 2013-12-10 | Disposition: A | Discharge status: Home / Self Care | Payer: Self-pay | Source: Ambulatory Visit | Attending: Internal Medicine | Admitting: Internal Medicine

## 2013-12-10 DIAGNOSIS — I10 Essential (primary) hypertension: Secondary | ICD-10-CM | POA: Insufficient documentation

## 2013-12-10 DIAGNOSIS — I519 Heart disease, unspecified: Secondary | ICD-10-CM | POA: Insufficient documentation

## 2013-12-11 ENCOUNTER — Encounter (INDEPENDENT_AMBULATORY_CARE_PROVIDER_SITE_OTHER): Payer: Medicare Other

## 2013-12-11 DIAGNOSIS — Z4651 Encounter for fitting and adjustment of gastric lap band: Secondary | ICD-10-CM | POA: Diagnosis not present

## 2013-12-12 ENCOUNTER — Encounter (HOSPITAL_COMMUNITY)
Admission: RE | Admit: 2013-12-12 | Discharge: 2013-12-12 | Disposition: A | Discharge status: Home / Self Care | Payer: Self-pay | Source: Ambulatory Visit | Attending: Internal Medicine | Admitting: Internal Medicine

## 2013-12-15 ENCOUNTER — Telehealth: Payer: Self-pay | Admitting: Internal Medicine

## 2013-12-15 ENCOUNTER — Encounter (HOSPITAL_COMMUNITY): Payer: Self-pay | Admitting: Emergency Medicine

## 2013-12-15 ENCOUNTER — Emergency Department (HOSPITAL_COMMUNITY): Payer: Medicare Other

## 2013-12-15 ENCOUNTER — Inpatient Hospital Stay (HOSPITAL_COMMUNITY)
Admission: EM | Admit: 2013-12-15 | Discharge: 2013-12-16 | DRG: 312 | Disposition: A | Discharge status: Home / Self Care | Payer: Medicare Other | Attending: Internal Medicine | Admitting: Internal Medicine

## 2013-12-15 ENCOUNTER — Encounter (HOSPITAL_COMMUNITY)
Admission: RE | Admit: 2013-12-15 | Discharge: 2013-12-15 | Disposition: A | Discharge status: Home / Self Care | Payer: Self-pay | Source: Ambulatory Visit | Attending: Internal Medicine | Admitting: Internal Medicine

## 2013-12-15 DIAGNOSIS — G4733 Obstructive sleep apnea (adult) (pediatric): Secondary | ICD-10-CM | POA: Diagnosis present

## 2013-12-15 DIAGNOSIS — Z96659 Presence of unspecified artificial knee joint: Secondary | ICD-10-CM | POA: Diagnosis not present

## 2013-12-15 DIAGNOSIS — R61 Generalized hyperhidrosis: Secondary | ICD-10-CM | POA: Diagnosis not present

## 2013-12-15 DIAGNOSIS — Z87891 Personal history of nicotine dependence: Secondary | ICD-10-CM | POA: Diagnosis not present

## 2013-12-15 DIAGNOSIS — I480 Paroxysmal atrial fibrillation: Secondary | ICD-10-CM

## 2013-12-15 DIAGNOSIS — R5381 Other malaise: Secondary | ICD-10-CM | POA: Diagnosis present

## 2013-12-15 DIAGNOSIS — E785 Hyperlipidemia, unspecified: Secondary | ICD-10-CM | POA: Diagnosis present

## 2013-12-15 DIAGNOSIS — Z6841 Body Mass Index (BMI) 40.0 and over, adult: Secondary | ICD-10-CM

## 2013-12-15 DIAGNOSIS — I10 Essential (primary) hypertension: Secondary | ICD-10-CM | POA: Diagnosis present

## 2013-12-15 DIAGNOSIS — I495 Sick sinus syndrome: Secondary | ICD-10-CM | POA: Diagnosis present

## 2013-12-15 DIAGNOSIS — I517 Cardiomegaly: Secondary | ICD-10-CM | POA: Diagnosis not present

## 2013-12-15 DIAGNOSIS — R5383 Other fatigue: Secondary | ICD-10-CM | POA: Diagnosis not present

## 2013-12-15 DIAGNOSIS — I4891 Unspecified atrial fibrillation: Secondary | ICD-10-CM | POA: Diagnosis not present

## 2013-12-15 DIAGNOSIS — Z9884 Bariatric surgery status: Secondary | ICD-10-CM

## 2013-12-15 DIAGNOSIS — I509 Heart failure, unspecified: Secondary | ICD-10-CM

## 2013-12-15 DIAGNOSIS — G609 Hereditary and idiopathic neuropathy, unspecified: Secondary | ICD-10-CM | POA: Diagnosis present

## 2013-12-15 DIAGNOSIS — I951 Orthostatic hypotension: Secondary | ICD-10-CM | POA: Diagnosis not present

## 2013-12-15 DIAGNOSIS — R531 Weakness: Secondary | ICD-10-CM

## 2013-12-15 DIAGNOSIS — R42 Dizziness and giddiness: Secondary | ICD-10-CM | POA: Diagnosis not present

## 2013-12-15 DIAGNOSIS — F329 Major depressive disorder, single episode, unspecified: Secondary | ICD-10-CM | POA: Diagnosis present

## 2013-12-15 DIAGNOSIS — M109 Gout, unspecified: Secondary | ICD-10-CM | POA: Diagnosis present

## 2013-12-15 DIAGNOSIS — I498 Other specified cardiac arrhythmias: Secondary | ICD-10-CM | POA: Diagnosis not present

## 2013-12-15 DIAGNOSIS — Z7901 Long term (current) use of anticoagulants: Secondary | ICD-10-CM | POA: Diagnosis not present

## 2013-12-15 DIAGNOSIS — Z86711 Personal history of pulmonary embolism: Secondary | ICD-10-CM

## 2013-12-15 DIAGNOSIS — I5032 Chronic diastolic (congestive) heart failure: Secondary | ICD-10-CM | POA: Diagnosis present

## 2013-12-15 DIAGNOSIS — R001 Bradycardia, unspecified: Secondary | ICD-10-CM

## 2013-12-15 DIAGNOSIS — F3289 Other specified depressive episodes: Secondary | ICD-10-CM | POA: Diagnosis present

## 2013-12-15 DIAGNOSIS — I959 Hypotension, unspecified: Secondary | ICD-10-CM | POA: Diagnosis not present

## 2013-12-15 DIAGNOSIS — Z79899 Other long term (current) drug therapy: Secondary | ICD-10-CM | POA: Diagnosis not present

## 2013-12-15 DIAGNOSIS — J45909 Unspecified asthma, uncomplicated: Secondary | ICD-10-CM | POA: Diagnosis present

## 2013-12-15 LAB — I-STAT TROPONIN, ED
Troponin i, poc: 0 ng/mL (ref 0.00–0.08)
Troponin i, poc: 0 ng/mL (ref 0.00–0.08)

## 2013-12-15 LAB — BASIC METABOLIC PANEL
ANION GAP: 12 (ref 5–15)
BUN: 29 mg/dL — AB (ref 6–23)
CALCIUM: 9.2 mg/dL (ref 8.4–10.5)
CO2: 29 mEq/L (ref 19–32)
CREATININE: 1.38 mg/dL — AB (ref 0.50–1.35)
Chloride: 99 mEq/L (ref 96–112)
GFR, EST AFRICAN AMERICAN: 60 mL/min — AB (ref >90)
GFR, EST NON AFRICAN AMERICAN: 51 mL/min — AB (ref >90)
Glucose, Bld: 56 mg/dL — ABNORMAL LOW (ref 70–99)
Potassium: 4.5 mEq/L (ref 3.7–5.3)
Sodium: 140 mEq/L (ref 137–147)

## 2013-12-15 LAB — CBC
HEMATOCRIT: 43.6 % (ref 39.0–52.0)
Hemoglobin: 14.4 g/dL (ref 13.0–17.0)
MCH: 26.3 pg (ref 26.0–34.0)
MCHC: 33 g/dL (ref 30.0–36.0)
MCV: 79.6 fL (ref 78.0–100.0)
PLATELETS: 214 10*3/uL (ref 150–400)
RBC: 5.48 MIL/uL (ref 4.22–5.81)
RDW: 17.8 % — AB (ref 11.5–15.5)
WBC: 8.7 10*3/uL (ref 4.0–10.5)

## 2013-12-15 LAB — TSH: TSH: 3.7 u[IU]/mL (ref 0.350–4.500)

## 2013-12-15 LAB — CBG MONITORING, ED
GLUCOSE-CAPILLARY: 93 mg/dL (ref 70–99)
Glucose-Capillary: 81 mg/dL (ref 70–99)

## 2013-12-15 LAB — MRSA PCR SCREENING: MRSA by PCR: NEGATIVE

## 2013-12-15 MED ORDER — PANTOPRAZOLE SODIUM 40 MG PO TBEC
40.0000 mg | DELAYED_RELEASE_TABLET | Freq: Two times a day (BID) | ORAL | Status: DC
  Administered 2013-12-15 – 2013-12-16 (×2): 40 mg via ORAL
  Filled 2013-12-15 (×3): qty 1

## 2013-12-15 MED ORDER — FLECAINIDE ACETATE 50 MG PO TABS
50.0000 mg | ORAL_TABLET | Freq: Two times a day (BID) | ORAL | Status: DC
  Administered 2013-12-15 – 2013-12-16 (×2): 50 mg via ORAL
  Filled 2013-12-15 (×3): qty 1

## 2013-12-15 MED ORDER — BUDESONIDE-FORMOTEROL FUMARATE 160-4.5 MCG/ACT IN AERO
2.0000 | INHALATION_SPRAY | Freq: Two times a day (BID) | RESPIRATORY_TRACT | Status: DC
Start: 2013-12-15 — End: 2013-12-16
  Filled 2013-12-15: qty 6

## 2013-12-15 MED ORDER — LORAZEPAM 0.5 MG PO TABS
0.5000 mg | ORAL_TABLET | Freq: Three times a day (TID) | ORAL | Status: DC
  Administered 2013-12-15 – 2013-12-16 (×2): 0.5 mg via ORAL
  Filled 2013-12-15 (×2): qty 1

## 2013-12-15 MED ORDER — LORATADINE 10 MG PO TABS
10.0000 mg | ORAL_TABLET | Freq: Every day | ORAL | Status: DC
  Administered 2013-12-16: 10 mg via ORAL
  Filled 2013-12-15: qty 1

## 2013-12-15 MED ORDER — RISPERIDONE 0.5 MG PO TABS
0.5000 mg | ORAL_TABLET | Freq: Every day | ORAL | Status: DC
  Administered 2013-12-15: 0.5 mg via ORAL
  Filled 2013-12-15 (×2): qty 1

## 2013-12-15 MED ORDER — APIXABAN 5 MG PO TABS
5.0000 mg | ORAL_TABLET | Freq: Two times a day (BID) | ORAL | Status: DC
  Administered 2013-12-15 – 2013-12-16 (×2): 5 mg via ORAL
  Filled 2013-12-15 (×3): qty 1

## 2013-12-15 MED ORDER — FINASTERIDE 5 MG PO TABS
5.0000 mg | ORAL_TABLET | Freq: Every day | ORAL | Status: DC
  Administered 2013-12-16: 5 mg via ORAL
  Filled 2013-12-15: qty 1

## 2013-12-15 MED ORDER — ADULT MULTIVITAMIN W/MINERALS CH
1.0000 | ORAL_TABLET | Freq: Every day | ORAL | Status: DC
  Administered 2013-12-15 – 2013-12-16 (×2): 1 via ORAL
  Filled 2013-12-15 (×2): qty 1

## 2013-12-15 MED ORDER — MONTELUKAST SODIUM 10 MG PO TABS
10.0000 mg | ORAL_TABLET | Freq: Every day | ORAL | Status: DC
  Administered 2013-12-15: 10 mg via ORAL
  Filled 2013-12-15 (×2): qty 1

## 2013-12-15 MED ORDER — SERTRALINE HCL 50 MG PO TABS
150.0000 mg | ORAL_TABLET | Freq: Every day | ORAL | Status: DC
  Administered 2013-12-16: 150 mg via ORAL
  Filled 2013-12-15 (×2): qty 1

## 2013-12-15 MED ORDER — TETRAHYDROZOLINE HCL 0.05 % OP SOLN: 1.0000 [drp] | Freq: Two times a day (BID) | OPHTHALMIC | Status: DC | PRN

## 2013-12-15 MED ORDER — SODIUM CHLORIDE 0.9 % IV SOLN
INTRAVENOUS | Status: DC
  Administered 2013-12-15: 1000 mL via INTRAVENOUS
  Administered 2013-12-16: 06:00:00 via INTRAVENOUS

## 2013-12-15 MED ORDER — FLUTICASONE PROPIONATE 50 MCG/ACT NA SUSP
2.0000 | Freq: Every day | NASAL | Status: DC
  Filled 2013-12-15: qty 16

## 2013-12-15 MED ORDER — ONE-DAILY MULTI VITAMINS PO TABS: 1.0000 | ORAL_TABLET | Freq: Every day | ORAL | Status: DC

## 2013-12-15 MED ORDER — POLYVINYL ALCOHOL 1.4 % OP SOLN
1.0000 [drp] | Freq: Two times a day (BID) | OPHTHALMIC | Status: DC | PRN
  Filled 2013-12-15: qty 15

## 2013-12-15 NOTE — H&P (Addendum)
Hospitalist Admission History and Physical  Patient name: [name] Medical record number: [id number] Date of birth: 04/27/1945 Age: 68 y.o. Gender: male  Primary Care Provider: [name], MD  Chief Complaint: weakness, diaphoresis  History of Present Illness:This is a 68 y.o. year old male with fairly extensive prior medical history noted for hospitalization 4/27-5/4 for pericarditis with pericardial effusion, paroxysmal afib on eliquus in setting of hx/o PE, as well as UGI, hx/o diastolic CHF  presenting with weakness and diaphoresis with cardiac rehab. Pt had a failry extensive medical course 6 months ago (please see discharge summary for full details). Pt states that he has been at his relative baseline since hospitalization. Pt states that he woke up this am feeling generally weak. Pt decided to still go to cardiac rehab. States that she was noted to be very weak during cardiac rehab w/ BPs in 80s and HR in 40s-50s. Pt states that this has not happened since hospital discharge. Denies any CP, SOB. No syncope. No hemiparesis or confusion. Pt attempted to exercise when he became severely diaphoretic and weak . Staff at cardiac rehab contacted pt's cardiologist ([facility]) who recommended pt come to the ER. Pt denies any extra doses of beta blocker. Has been compliant to medication regimen.  On arrival, pt with noted HR in 40s-50s. BP in 80s-120s. Afebrile. Satting > 95% on RA. Bloodwork essentially WNL apart from Cr 1.38 (baseline cr 1.1), Glu 58. Trop neg x2. EKG w sinus bradycardia and non specific ST/T wave changes. Cards consulted on case. Asking for medical admission.   Assessment and Plan: DAO MEMMOTT is a 68 y.o. year old male presenting with weakness  Active Problems:   Weakness   1-Weakness -Broad ddx including neurocardiogenic sxs and medication -some concern for beta-blocker toxicity -HOLD metoprolol  -gently hydrate  -serial orthostatics -Trop neg x2, EKG  sinus brady- no significant T/ST changes though noted TWI in 1 anterior lead    -may benefit from glucagon (given concominant hypoglycemia) -No clinical signs of pericarditis/pericardial effusion on presentation-no CP, diffuse ST elevation on EKG, positional CP.  -repeat 2D ECHO, pro BNP   -cycle CEs  -f/u cards recs -check MRI brain   2-Afib/diastolic CHF  -on eliquus  -sinus brady on EKG today  -Cont pending cards recs  -hold diuretic   3-Hypertension -Low BPs  -hold meds  4-hx/o gastric ulcers  -cont high dose PPI  -hgb stable  -no overt signs of bleeding in setting of eliquus use.   5-Asthma -Cont home regimen  -no wheezing currently  6-OSA -cont CPAP  FEN/GI: s/p lap band-heart healthy diet, ppi, multivitamin Prophylaxis: eliquus Disposition: pending further evaluation  Code Status:FUll COde    Patient Active Problem List   Diagnosis Date Noted  . Weakness 12/15/2013  . Chest pain 08/20/2013  . Abnormal transaminases 08/01/2013  . Abnormal serum level of alkaline phosphatase 08/01/2013  . Benign neoplasm of colon 07/31/2013  . Chronic cough 07/30/2013  . Hypotension 07/29/2013  . Leukocytosis 07/29/2013  . PAF (paroxysmal atrial fibrillation) with RVR 07/29/2013  . Anemia 07/29/2013  . Upper GI bleed- recurrent 07/29/2013  . Hypotension, unspecified 07/29/2013  . Orthostasis 07/29/2013  . Melena 07/22/2013  . Chronic anticoagulation- on hold 07/22/2013  . Gastric ulcer with hemorrhage 07/20/13 07/22/2013  . Hepatitis 07/17/2013  . Pulmonary thromboembolism- 07/16/13 07/16/2013  . Pericardial effusion s/p pericardial window 07/18/13 400cc 07/16/2013  . Pericarditis 07/16/2013  . Diastolic congestive heart failure 07/16/2013  . H/O laparoscopic  adjustable gastric banding & hiatal hernia repair 04/08/13 04/23/2013  . Other abnormal glucose 02/05/2013  . TMJ (temporomandibular joint syndrome) 09/11/2012  . Morbid obesity 08/09/2012  . Peripheral neuropathy  11/02/2009  . PROSTATE SPECIFIC ANTIGEN, ELEVATED 11/02/2009  . COLONIC POLYPS, HX OF 11/02/2009  . HYPERLIPIDEMIA 09/10/2008  . DEPRESSION 09/10/2008  . SLEEP APNEA- on C-pap 09/10/2008  . NEPHROLITHIASIS, HX OF 09/10/2008  . Dysmetabolic syndrome X 25/85/2778  . GOUT 06/28/2006  . HTN (hypertension) 06/28/2006  . BENIGN PROSTATIC HYPERTROPHY 06/28/2006   Past Medical History: Past Medical History  Diagnosis Date  . Benign prostatic hypertrophy     several prostate biopsies neg for cancer.   . Hypertension   . Gout   . Depression   . FH: colonic polyps 2010  . Morbid obesity   . Complication of anesthesia     MORPHINE CAUSES HALLUCINATIONS  . Asthma     childhood asthma  . Numbness     feet / legs  . History of kidney stones   . Kidney cysts   . Sleep apnea     USES C PAP  . Bilateral hip pain   . PAF (paroxysmal atrial fibrillation) with RVR 07/29/2013  . Anemia 07/29/2013    Past Surgical History: Past Surgical History  Procedure Laterality Date  . Total knee replacment      bilat  . Cholecystectomy  1989  . Trigger finger repair      also lue, cts surgically repaired  . Renal calculi    . Tonsillectomy    . Lithotripsy    . Arthroscopy knee w/ drilling    . Breath tek h pylori N/A 12/23/2012    Procedure: BREATH TEK H PYLORI;  Surgeon: Gayland Curry, MD;  Location: [facility];  Service: General;  Laterality: N/A;  . Wisdom tooth extraction    . Laparoscopic gastric banding with hiatal hernia repair N/A 04/08/2013    Procedure: LAPAROSCOPIC GASTRIC BANDING WITH possible  HIATAL HERNIA REPAIR;  Surgeon: Gayland Curry, MD;  Location: [facility];  Service: General;  Laterality: N/A;  . Subxyphoid pericardial window N/A 07/18/2013    Procedure: SUBXYPHOID PERICARDIAL WINDOW;  Surgeon: Grace Isaac, MD;  Location: [place];  Service: Thoracic;  Laterality: N/A;  . Intraoperative transesophageal echocardiogram N/A 07/18/2013    Procedure: INTRAOPERATIVE TRANSESOPHAGEAL  ECHOCARDIOGRAM;  Surgeon: Grace Isaac, MD;  Location: [place];  Service: Open Heart Surgery;  Laterality: N/A;  . Esophagogastroduodenoscopy N/A 07/22/2013    Procedure: ESOPHAGOGASTRODUODENOSCOPY (EGD);  Surgeon: Irene Shipper, MD;  Location: [facility];  Service: Endoscopy;  Laterality: N/A;  . Colonoscopy N/A 07/31/2013    Procedure: COLONOSCOPY;  Surgeon: Gatha Mayer, MD;  Location: [place];  Service: Endoscopy;  Laterality: N/A;  . Esophagogastroduodenoscopy N/A 07/31/2013    Procedure: ESOPHAGOGASTRODUODENOSCOPY (EGD);  Surgeon: Gatha Mayer, MD;  Location: [facility] ENDOSCOPY;  Service: Endoscopy;  Laterality: N/A;    Social History: History   Social History  . Marital Status: Married    Spouse Name: N/A    Number of Children: N/A  . Years of Education: N/A   Social History Main Topics  . Smoking status: Former Smoker    Types: Cigarettes    Quit date: 08/30/1974  . Smokeless tobacco: None  . Alcohol Use: Yes     Comment: Occasionally; 1 small drink a week  . Drug Use: No  . Sexual Activity: None   Other Topics Concern  . None  Social History Narrative  . None    Family History: Family History  Problem Relation Age of Onset  . Depression Mother   . Hypertension Mother   . Heart disease Father     MI  . Depression Brother   . Stroke Paternal Aunt     CVA  . Diabetes Paternal Uncle   . Heart disease Paternal Uncle     MI  . Heart disease Paternal Grandmother     MI  . Diabetes Cousin     PATERNAL    Allergies: Allergies  Allergen Reactions  . Propoxyphene N-Acetaminophen Nausea Only  . Codeine     nausea  . Morphine     Nausea Severe hallucinations  . Penicillins     LOC with shot    Current Facility-Administered Medications  Medication Dose Route Frequency Provider Last Rate Last Dose  . 0.9 %  sodium chloride infusion   Intravenous Continuous Shanda Howells, MD      . apixaban Arne [facility]) tablet 5 mg  5 mg Oral BID Shanda Howells, MD       . budesonide-formoterol [facility]) 160-4.5 MCG/ACT inhaler 2 puff  2 puff Inhalation BID Shanda Howells, MD      . finasteride (PROSCAR) tablet 5 mg  5 mg Oral Daily Shanda Howells, MD      . flecainide [facility]) tablet 50 mg  50 mg Oral Q12H Shanda Howells, MD      . fluticasone [facility]) 50 MCG/ACT nasal spray 2 spray  2 spray Each Nare Daily Shanda Howells, MD      . loratadine (CLARITIN) tablet 10 mg  10 mg Oral Daily Shanda Howells, MD      . LORazepam (ATIVAN) tablet 0.5 mg  0.5 mg Oral TID Shanda Howells, MD      . montelukast (SINGULAIR) tablet 10 mg  10 mg Oral QHS Shanda Howells, MD      . multivitamin tablet 1 tablet  1 tablet Oral Daily Shanda Howells, MD      . pantoprazole (PROTONIX) EC tablet 40 mg  40 mg Oral BID Shanda Howells, MD      . risperiDONE (RISPERDAL) tablet 0.5 mg  0.5 mg Oral QHS Shanda Howells, MD      . Derrill Memo ON 12/16/2013] sertraline (ZOLOFT) tablet 150 mg  150 mg Oral Q breakfast Shanda Howells, MD      . tetrahydrozoline 0.05 % ophthalmic solution 1 drop  1 drop Both Eyes BID PRN Shanda Howells, MD       Current Outpatient Prescriptions  Medication Sig Dispense Refill  . apixaban (ELIQUIS) 5 MG TABS tablet Take 5 mg by mouth 2 (two) times daily.      . budesonide-formoterol (SYMBICORT) 160-4.5 MCG/ACT inhaler Inhale 2 puffs into the lungs 2 (two) times daily.  1 Inhaler  3  . CALCIUM PO Take 1 tablet by mouth 3 (three) times daily.      . Calcium-Magnesium-Vitamin D (CALCIUM 500 PO) Take 1 tablet by mouth daily.       . Cyanocobalamin (VITAMIN B-12) 1000 MCG SUBL Place 1,000 mcg under the tongue every morning.      [name] Sodium (DSS) 100 MG CAPS Take 100 mg by mouth every other day.      . finasteride (PROSCAR) 5 MG tablet Take 5 mg by mouth daily.       . flecainide (TAMBOCOR) 50 MG tablet Take 1 tablet (50 mg total) by mouth every 12 (twelve) hours.  60 tablet  5  .  fluticasone (FLONASE) 50 MCG/ACT nasal spray Place 2 sprays into both nostrils daily.      .  furosemide (LASIX) 20 MG tablet Take 1 tablet (20 mg total) by mouth daily.  90 tablet  1  . loratadine (CLARITIN) 10 MG tablet Take 1 tablet (10 mg total) by mouth daily.  30 tablet  5  . LORazepam (ATIVAN) 0.5 MG tablet Take 0.5 mg by mouth 3 (three) times daily. Take 0.5 in the AM and 2.5mg  in the PM      . metoprolol tartrate (LOPRESSOR) 25 MG tablet Take 12.5 mg by mouth once.      . montelukast (SINGULAIR) 10 MG tablet Take 1 tablet (10 mg total) by mouth at bedtime.  30 tablet  3  . Multiple Vitamin (MULTIVITAMIN) tablet Take 1 tablet by mouth daily. Chewable tablet with iron      . pantoprazole (PROTONIX) 40 MG tablet Take 40 mg by mouth 2 (two) times daily.      . polyethylene glycol (MIRALAX / GLYCOLAX) packet Take 17 g by mouth daily.  14 each  0  . potassium chloride (K-DUR,KLOR-CON) 10 MEQ tablet Take 1 tablet (10 mEq total) by mouth 3 (three) times daily.  90 tablet  5  . risperiDONE (RISPERDAL) 0.5 MG tablet Take 1 tablet (0.5 mg total) by mouth at bedtime.  30 tablet  2  . sertraline (ZOLOFT) 100 MG tablet Take 1.5 tablets (150 mg total) by mouth daily with breakfast.  45 tablet  2  . Tetrahydrozoline HCl (VISINE OP) Place 2 drops into both eyes daily as needed (dry eyes).       Review Of Systems: 12 point ROS negative except as noted above in HPI.  Physical Exam: Filed Vitals:   12/15/13 1845  BP: 128/67  Pulse: 49  Temp:   Resp: 11    General: alert, cooperative and moderate distress HEENT: PERRLA and extra ocular movement intact Heart: S1, S2 normal, no murmur, rub or gallop, regular rate and rhythm Lungs: clear to auscultation, no wheezes or rales and unlabored breathing Abdomen: abdomen is soft without significant tenderness, masses, organomegaly or guarding Extremities: 2+ peripheral pulses, 1-2 + edema bilaterally  Skin:no rashes, no ecchymoses Neurology: normal without focal findings  Labs and Imaging: Lab Results  Component Value Date/Time   NA 140  12/15/2013  1:46 PM   K 4.5 12/15/2013  1:46 PM   CL 99 12/15/2013  1:46 PM   CO2 29 12/15/2013  1:46 PM   BUN 29* 12/15/2013  1:46 PM   CREATININE 1.38* 12/15/2013  1:46 PM   CREATININE 1.12 12/05/2012  2:06 PM   GLUCOSE 56* 12/15/2013  1:46 PM   Lab Results  Component Value Date   WBC 8.7 12/15/2013   HGB 14.4 12/15/2013   HCT 43.6 12/15/2013   MCV 79.6 12/15/2013   PLT 214 12/15/2013    Dg Chest 2 View  12/15/2013   CLINICAL DATA:  Lightheadedness and diaphoresis with hypotension and generalize weakness ; history of asthma and previous tobacco use.  EXAM: CHEST  2 VIEW  COMPARISON:  PA and lateral chest x-ray of September 09, 2013  FINDINGS: The lungs are adequately inflated. There is no focal infiltrate or pleural effusion. The interstitial markings are mildly prominent but not greatly changed from the previous study. The heart and pulmonary vascularity are normal. There is no pneumothorax or pneumomediastinum. The observed portions of the bony thorax are unremarkable.  IMPRESSION: There is no evidence of CHF  nor pneumonia. No reaccumulation of a pleural effusion is demonstrated. Coarse interstitial lung markings are likely related to the patient's known reactive airway disease and history of previous tobacco use.   Electronically Signed   By: David  Martinique   On: 12/15/2013 14:29           Shanda Howells MD  Pager: [phone]

## 2013-12-15 NOTE — ED Provider Notes (Signed)
CSN: [id number]     Arrival date & time 12/15/13  28 History   First MD Initiated Contact with Patient 12/15/13 1706     Chief Complaint  Patient presents with  . Hypotension    The history is provided by the patient.   [name] is a 68 y.o. male who presents from cardiac rehab for eval of lightheadedness. During eexertion, pt became diaphoretic, hypotensive and weak.  Cardiologist told to come to ED for further eval. Lightheaded the whole rehab session and not with standing.  Denies chest pain or shortness of breath and none with exertion during rehab.  At rehab for pericarditis months ago, had acute HF at that time.  Given oral fluids at rehab.  Believes he was well hydrated previously. Does drink a lot of coffee.    Orthostatic hypotension 88/47 standing, 107/70 sitting at cardiac rehab.   Taking meds compliant. HR typically in 50-60s. Denies vision changes or nausea.   Past Medical History  Diagnosis Date  . Benign prostatic hypertrophy     several prostate biopsies neg for cancer.   . Hypertension   . Gout   . Depression   . FH: colonic polyps 2010  . Morbid obesity   . Complication of anesthesia     MORPHINE CAUSES HALLUCINATIONS  . Asthma     childhood asthma  . Numbness     feet / legs  . History of kidney stones   . Kidney cysts   . Sleep apnea     USES C PAP  . Bilateral hip pain   . PAF (paroxysmal atrial fibrillation) with RVR 07/29/2013  . Anemia 07/29/2013   Past Surgical History  Procedure Laterality Date  . Total knee replacment      bilat  . Cholecystectomy  1989  . Trigger finger repair      also lue, cts surgically repaired  . Renal calculi    . Tonsillectomy    . Lithotripsy    . Arthroscopy knee w/ drilling    . Breath tek h pylori N/A 12/23/2012    Procedure: BREATH TEK H PYLORI;  Surgeon: Gayland Curry, MD;  Location: [facility];  Service: General;  Laterality: N/A;  . Wisdom tooth extraction    . Laparoscopic gastric banding with  hiatal hernia repair N/A 04/08/2013    Procedure: LAPAROSCOPIC GASTRIC BANDING WITH possible  HIATAL HERNIA REPAIR;  Surgeon: Gayland Curry, MD;  Location: [facility];  Service: General;  Laterality: N/A;  . Subxyphoid pericardial window N/A 07/18/2013    Procedure: SUBXYPHOID PERICARDIAL WINDOW;  Surgeon: Grace Isaac, MD;  Location: [place];  Service: Thoracic;  Laterality: N/A;  . Intraoperative transesophageal echocardiogram N/A 07/18/2013    Procedure: INTRAOPERATIVE TRANSESOPHAGEAL ECHOCARDIOGRAM;  Surgeon: Grace Isaac, MD;  Location: [place];  Service: Open Heart Surgery;  Laterality: N/A;  . Esophagogastroduodenoscopy N/A 07/22/2013    Procedure: ESOPHAGOGASTRODUODENOSCOPY (EGD);  Surgeon: Irene Shipper, MD;  Location: [facility] ENDOSCOPY;  Service: Endoscopy;  Laterality: N/A;  . Colonoscopy N/A 07/31/2013    Procedure: COLONOSCOPY;  Surgeon: Gatha Mayer, MD;  Location: [place];  Service: Endoscopy;  Laterality: N/A;  . Esophagogastroduodenoscopy N/A 07/31/2013    Procedure: ESOPHAGOGASTRODUODENOSCOPY (EGD);  Surgeon: Gatha Mayer, MD;  Location: [facility] ENDOSCOPY;  Service: Endoscopy;  Laterality: N/A;   Family History  Problem Relation Age of Onset  . Depression Mother   . Hypertension Mother   . Heart disease Father  MI  . Depression Brother   . Stroke Paternal Aunt     CVA  . Diabetes Paternal Uncle   . Heart disease Paternal Uncle     MI  . Heart disease Paternal Grandmother     MI  . Diabetes Cousin     PATERNAL   History  Substance Use Topics  . Smoking status: Former Smoker    Types: Cigarettes    Quit date: 08/30/1974  . Smokeless tobacco: Not on file  . Alcohol Use: Yes     Comment: Occasionally; 1 small drink a week    Review of Systems  Constitutional: Positive for diaphoresis. Negative for fever and chills.  Eyes: Negative for visual disturbance.  Respiratory: Negative for cough, shortness of breath and wheezing.   Cardiovascular: Negative for chest pain  and palpitations.  Gastrointestinal: Negative for nausea, vomiting and abdominal pain.  Musculoskeletal: Negative for back pain.  Skin: Negative for rash.  Neurological: Positive for light-headedness. Negative for dizziness.  All other systems reviewed and are negative.     Allergies  Propoxyphene n-acetaminophen; Codeine; Morphine; and Penicillins  Home Medications   Prior to Admission medications   Medication Sig Start Date End Date Taking? Authorizing Provider  apixaban (ELIQUIS) 5 MG TABS tablet Take 1 tablet (5 mg total) by mouth 2 (two) times daily. 08/26/13   Hendricks Limes, MD  budesonide-formoterol [facility]) 160-4.5 MCG/ACT inhaler Inhale 2 puffs into the lungs 2 (two) times daily. 08/15/13   Hendricks Limes, MD  Calcium-Magnesium-Vitamin D (CALCIUM 500 PO) Take by mouth.    Historical Provider, MD  Cyanocobalamin (VITAMIN B-12) 1000 MCG SUBL Place 1,000 mcg under the tongue every morning.    Historical Provider, MD  Dextromethorphan-Guaifenesin (ROBITUSSIN DM PO) Take 15 mLs by mouth daily as needed (cough).    Historical Provider, MD  docusate sodium 100 MG CAPS Take 100 mg by mouth 2 (two) times daily. 07/25/13   Kelvin Cellar, MD  finasteride (PROSCAR) 5 MG tablet Take 5 mg by mouth daily.     Historical Provider, MD  flecainide (TAMBOCOR) 50 MG tablet Take 1 tablet (50 mg total) by mouth every 12 (twelve) hours. 08/26/13   Hendricks Limes, MD  fluticasone (FLONASE) 50 MCG/ACT nasal spray Place 2 sprays into both nostrils daily. 08/04/13   Geradine Girt, DO  furosemide (LASIX) 20 MG tablet Take 1 tablet (20 mg total) by mouth daily. 09/06/13   Hendricks Limes, MD  loratadine (CLARITIN) 10 MG tablet Take 1 tablet (10 mg total) by mouth daily. 08/26/13   Hendricks Limes, MD  LORazepam (ATIVAN) 0.5 MG tablet 0.5 mg in the morning and 1 mg at bedtime 12/04/13   Kathlee Nations, MD  metoprolol tartrate (LOPRESSOR) 25 MG tablet Take 0.5 tablets (12.5 mg total) by mouth once. 08/08/13    Brittainy Simmons, PA-C  montelukast (SINGULAIR) 10 MG tablet Take 1 tablet (10 mg total) by mouth at bedtime. 08/15/13   Hendricks Limes, MD  Multiple Vitamin (MULTIVITAMIN) tablet Take 1 tablet by mouth daily. Chewable tablet with iron    Historical Provider, MD  ondansetron (ZOFRAN-ODT) 4 MG disintegrating tablet Take 1 tablet (4 mg total) by mouth every 8 (eight) hours as needed for nausea or vomiting. 08/04/13   Geradine Girt, DO  pantoprazole (PROTONIX) 40 MG tablet Take 1 tablet (40 mg total) by mouth 2 (two) times daily. 09/06/13   Hendricks Limes, MD  polyethylene glycol [facility] Floria Raveling) packet Take  17 g by mouth daily. 07/25/13   Kelvin Cellar, MD  potassium chloride (K-DUR,KLOR-CON) 10 MEQ tablet Take 1 tablet (10 mEq total) by mouth 3 (three) times daily. 08/26/13   Hendricks Limes, MD  risperiDONE (RISPERDAL) 0.5 MG tablet Take 1 tablet (0.5 mg total) by mouth at bedtime. 12/04/13   Kathlee Nations, MD  sertraline (ZOLOFT) 100 MG tablet Take 1.5 tablets (150 mg total) by mouth daily with breakfast. 12/04/13   Kathlee Nations, MD  Tetrahydrozoline HCl (VISINE OP) Place 2 drops into both eyes daily as needed (dry eyes).    Historical Provider, MD   BP 103/80  Pulse 54  Temp(Src) 98.7 F (37.1 C) (Oral)  Resp 15  Wt 298 lb (135.172 kg)  SpO2 97% Physical Exam  Nursing note and vitals reviewed. Constitutional: He is oriented to person, place, and time. He appears well-developed and well-nourished. No distress.  Well appearing  HENT:  Head: Normocephalic and atraumatic.  Nose: Nose normal.  Mouth/Throat: Oropharynx is clear and moist.  Eyes: Conjunctivae are normal. Pupils are equal, round, and reactive to light.  Neck: Normal range of motion. Neck supple. No JVD present. No tracheal deviation present.  Cardiovascular: Regular rhythm and normal heart sounds.   No murmur heard. Brady 50 bpm  Pulmonary/Chest: Effort normal and breath sounds normal. No respiratory distress. He has no  rales.  Abdominal: Soft. Bowel sounds are normal. He exhibits no distension and no mass. There is no tenderness.  Musculoskeletal: Normal range of motion. He exhibits no edema.  Neurological: He is alert and oriented to person, place, and time.  5/5 muscle strength in all extremities with flexion and extension.  Normal bulk and tone.  No sensory deficit to light touch.  Tandem gait normal.   Skin: Skin is warm and dry. No rash noted.  Psychiatric: He has a normal mood and affect.    ED Course  Procedures (including critical care time) Labs Review Labs Reviewed  CBC - Abnormal; Notable for the following:    RDW 17.8 (*)    All other components within normal limits  BASIC METABOLIC PANEL - Abnormal; Notable for the following:    Glucose, Bld 56 (*)    BUN 29 (*)    Creatinine, Ser 1.38 (*)    GFR calc non Af Amer 51 (*)    GFR calc Af Amer 60 (*)    All other components within normal limits  I-STAT TROPOININ, ED    Imaging Review Dg Chest 2 View  12/15/2013   CLINICAL DATA:  Lightheadedness and diaphoresis with hypotension and generalize weakness ; history of asthma and previous tobacco use.  EXAM: CHEST  2 VIEW  COMPARISON:  PA and lateral chest x-ray of September 09, 2013  FINDINGS: The lungs are adequately inflated. There is no focal infiltrate or pleural effusion. The interstitial markings are mildly prominent but not greatly changed from the previous study. The heart and pulmonary vascularity are normal. There is no pneumothorax or pneumomediastinum. The observed portions of the bony thorax are unremarkable.  IMPRESSION: There is no evidence of CHF nor pneumonia. No reaccumulation of a pleural effusion is demonstrated. Coarse interstitial lung markings are likely related to the patient's known reactive airway disease and history of previous tobacco use.   Electronically Signed   By: David  Martinique   On: 12/15/2013 14:29     EKG Interpretation   Date/Time:  Monday December 15 2013  13:50:00 EDT Ventricular Rate:  52 PR  Interval:  156 QRS Duration: 90 QT Interval:  450 QTC Calculation: 418 R Axis:   88 Text Interpretation:  Sinus bradycardia Nonspecific ST and T wave  abnormality Abnormal ECG since last tracing no significant change  Confirmed by MILLER  MD, BRIAN ([id number]) on 12/15/2013 6:17:50 PM      MDM   Final diagnoses:  Weakness  Chronic diastolic congestive heart failure  Hypotension, unspecified  Morbid obesity  PAF (paroxysmal atrial fibrillation)  Sinus bradycardia    Pt presents from cardiac rehab for eval of hypotension.  Bradycardic.  +orthostasis. Denies CP, no ischemic changes on EKG and trop neg. No ST elevation or PR depression to indicate pericarditis.  No DVT signs, doubt PE as history would not suggest.  No metabolic derangement. No neuro deficit.  Pt does not appear dehydrated on exam. Cr 1.38 (baseline 1.2) may be dehydrated.  CXR unremarkable.  Pt may be overly beta blocked, BP stable, mentating well defer glucagon.  Cardiology consulted, will see pt but will admit to hospitalist.  Exam unchanged, no symptoms currently.       Tammy Sours, MD 12/15/13 [phone]

## 2013-12-15 NOTE — ED Notes (Signed)
Pt reports was at cardiac rehab today when he became lightheaded, diaphoretic, hypotensive and felt weak. Pt is a x 4. Dr. Debara Pickett, told to come here.

## 2013-12-15 NOTE — Consult Note (Signed)
CARDIOLOGY CONSULT NOTE   Patient ID: [name] MRN: [id number], DOB/AGE: 1945-04-06   Admit date: 12/15/2013 Date of Consult: 12/15/2013  Primary Physician: [name], MD Primary Cardiologist: Mali [name]  Reason for consult:  Hypotension, diaphoresis, bradycardia  Problem List  Past Medical History  Diagnosis Date  . Benign prostatic hypertrophy     several prostate biopsies neg for cancer.   . Hypertension   . Gout   . Depression   . FH: colonic polyps 2010  . Morbid obesity   . Complication of anesthesia     MORPHINE CAUSES HALLUCINATIONS  . Asthma     childhood asthma  . Numbness     feet / legs  . History of kidney stones   . Kidney cysts   . Sleep apnea     USES C PAP  . Bilateral hip pain   . PAF (paroxysmal atrial fibrillation) with RVR 07/29/2013  . Anemia 07/29/2013    Past Surgical History  Procedure Laterality Date  . Total knee replacment      bilat  . Cholecystectomy  1989  . Trigger finger repair      also lue, cts surgically repaired  . Renal calculi    . Tonsillectomy    . Lithotripsy    . Arthroscopy knee w/ drilling    . Breath tek h pylori N/A 12/23/2012    Procedure: BREATH TEK H PYLORI;  Surgeon: Gayland Curry, MD;  Location: [facility];  Service: General;  Laterality: N/A;  . Wisdom tooth extraction    . Laparoscopic gastric banding with hiatal hernia repair N/A 04/08/2013    Procedure: LAPAROSCOPIC GASTRIC BANDING WITH possible  HIATAL HERNIA REPAIR;  Surgeon: Gayland Curry, MD;  Location: [facility];  Service: General;  Laterality: N/A;  . Subxyphoid pericardial window N/A 07/18/2013    Procedure: SUBXYPHOID PERICARDIAL WINDOW;  Surgeon: Grace Isaac, MD;  Location: [place];  Service: Thoracic;  Laterality: N/A;  . Intraoperative transesophageal echocardiogram N/A 07/18/2013    Procedure: INTRAOPERATIVE TRANSESOPHAGEAL ECHOCARDIOGRAM;  Surgeon: Grace Isaac, MD;  Location: [place];  Service: Open Heart Surgery;  Laterality:  N/A;  . Esophagogastroduodenoscopy N/A 07/22/2013    Procedure: ESOPHAGOGASTRODUODENOSCOPY (EGD);  Surgeon: Irene Shipper, MD;  Location: [facility] ENDOSCOPY;  Service: Endoscopy;  Laterality: N/A;  . Colonoscopy N/A 07/31/2013    Procedure: COLONOSCOPY;  Surgeon: Gatha Mayer, MD;  Location: [place];  Service: Endoscopy;  Laterality: N/A;  . Esophagogastroduodenoscopy N/A 07/31/2013    Procedure: ESOPHAGOGASTRODUODENOSCOPY (EGD);  Surgeon: Gatha Mayer, MD;  Location: [facility] ENDOSCOPY;  Service: Endoscopy;  Laterality: N/A;     Allergies  Allergies  Allergen Reactions  . Propoxyphene N-Acetaminophen Nausea Only  . Codeine     nausea  . Morphine     Nausea Severe hallucinations  . Penicillins     LOC with shot    HPI:  [name] is a 68 y/o male with a history of bariatric surgery, one year ago, lost about 85 pounds before and after surgery. Also has a history of hypertension and sleep apnea. He has been admitted twice in May 2015 with an unusual presentation with multiple medical problems, including pulmonary embolus, moderate to large pericardial effusion, new onset a-fib and GI bleed. His PE was diagnosed by his PCP through outpatient w/u and he was placed on Xarelto. His first hospital admission was from 4/15-4/24. At that time his CC was chest pain. W/u suggested a diagnosis of  pericarditis. An Echocardiogram was done, which showed moderate to large pericardial effusion. Meanwhile the night of 4/16 he went in to afib with RVR, probably secondary to PE and was transferred to step down and started on cardizem drip. He converted to sinus the same night , cardizem discontinued and he was resumed on metoprolol. CT surgery was consulted for pericardial window. He underwent pericardial window on 4/17 and hemorrhagic fluid was drained and was sent for analysis. Gram stain and cultures were negative. He was also started on colchicine for his pericarditis. The evening of 4/20 pt went back in to  afib with RVR and had bloody bowel movement. His xarelto was discontinued and he was put on cardizem gtt. Over the next 24 hours his hemoglobin dropped to 7.8 and he received 2 units of prbc transfusion. His repeat H&H improved. Dr Henrene Pastor was consulted from GI on 4/20 and he underwent endoscopy on 4/21 . He was found to have superficial ulcers in the stomach, underwent biopsy of the area. He was recommended to continue on PPI twice daily for 2 weeks and resume xarelto. He was discharged home then returned 3 days later on 07/28/13 with a complaint of increased fatigue and SOB. Repeat 2D echo demonstrated only minimal residual effusion with normal RV and LV function. 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 He was admitted and underwent a repeat EGD which showed healing of ulcers, no bleed. He was continued on PO iron and a PPI. His Hgb at time of discharge was stable at 9.0. He was discharged home. Cardiology had recommended an OP stress test. This was done 08/03/13 and showed normal myocardial perfusion and normal LV EF of 58%.  He was last seen by Dr Debara Pickett in the clinic on 09/29/13 with a marked improvement in his shortness of breath. He was able to do most activities without any difficulty. He was continued on Eliquis for his pulmonary embolus and for stroke prevention. He is on flecainide for antiarrhythmic therapy. He had no chest pain.  He was sent to the ER today for a hypotensive episode post exercise at cardiac rehab. BP: 94/57 pt c/o lightheadedness. Recheck: 88/47 standing, 107/70 sitting. Pt c/o feeling lightheaded. Pt sweating perfusely with exercise today. Pt denies chest pain, dyspnea, edema. Pt drank 12oz water and 8oz gatorade. Recheck BP: 91/69 standing. Pt continues to c/o lightheadness. Telemetry-nsr HR-60. 15 minute recheck: 86/64. Reviewed with Dr. Debara Pickett. Per Dr. Debara Pickett pt taken to Ed via wheelchair.   Inpatient Medications  . apixaban  5 mg Oral BID  . budesonide-formoterol  2  puff Inhalation BID  . finasteride  5 mg Oral Daily  . flecainide  50 mg Oral Q12H  . fluticasone  2 spray Each Nare Daily  . loratadine  10 mg Oral Daily  . LORazepam  0.5 mg Oral TID  . montelukast  10 mg Oral QHS  . multivitamin  1 tablet Oral Daily  . pantoprazole  40 mg Oral BID  . risperiDONE  0.5 mg Oral QHS  . [START ON 12/16/2013] sertraline  150 mg Oral Q breakfast    Family History Family History  Problem Relation Age of Onset  . Depression Mother   . Hypertension Mother   . Heart disease Father     MI  . Depression Brother   . Stroke Paternal Aunt     CVA  . Diabetes Paternal Uncle   . Heart disease Paternal Uncle     MI  .  Heart disease Paternal Grandmother     MI  . Diabetes Cousin     PATERNAL     Social History History   Social History  . Marital Status: Married    Spouse Name: N/A    Number of Children: N/A  . Years of Education: N/A   Occupational History  . Not on file.   Social History Main Topics  . Smoking status: Former Smoker    Types: Cigarettes    Quit date: 08/30/1974  . Smokeless tobacco: Not on file  . Alcohol Use: Yes     Comment: Occasionally; 1 small drink a week  . Drug Use: No  . Sexual Activity: Not on file   Other Topics Concern  . Not on file   Social History Narrative  . No narrative on file     Review of Systems  General:  No chills, fever, night sweats or weight changes.  Cardiovascular:  No chest pain, dyspnea on exertion, edema, orthopnea, palpitations, paroxysmal nocturnal dyspnea. Dermatological: No rash, lesions/masses Respiratory: No cough, dyspnea Urologic: No hematuria, dysuria Abdominal:   No nausea, vomiting, diarrhea, bright red blood per rectum, melena, or hematemesis Neurologic:  No visual changes, wkns, changes in mental status. All other systems reviewed and are otherwise negative except as noted above.  Physical Exam  Blood pressure 128/67, pulse 49, temperature 98.7 F (37.1 C),  temperature source Oral, resp. rate 11, weight 298 lb (135.172 kg), SpO2 95.00%.  General: Pleasant, NAD Psych: Normal affect. Neuro: Alert and oriented X 3. Moves all extremities spontaneously. HEENT: Normal  Neck: Supple without bruits or JVD. Lungs:  Resp regular and unlabored, CTA. Heart: RRR no s3, s4, or murmurs. Abdomen: Soft, non-tender, non-distended, BS + x 4.  Extremities: No clubbing, cyanosis or edema. DP/PT/Radials 2+ and equal bilaterally.  Labs  No results found for this basename: CKTOTAL, CKMB, TROPONINI,  in the last 72 hours Lab Results  Component Value Date   WBC 8.7 12/15/2013   HGB 14.4 12/15/2013   HCT 43.6 12/15/2013   MCV 79.6 12/15/2013   PLT 214 12/15/2013    Recent Labs Lab 12/15/13 1346  NA 140  K 4.5  CL 99  CO2 29  BUN 29*  CREATININE 1.38*  CALCIUM 9.2  GLUCOSE 56*   Lab Results  Component Value Date   CHOL 138 12/05/2012   HDL 34* 12/05/2012   LDLCALC 77 12/05/2012   TRIG 137 12/05/2012   Lab Results  Component Value Date   DDIMER 2.30* 07/15/2013   Radiology/Studies  Dg Chest 2 View  12/15/2013   CLINICAL DATA:  Lightheadedness and diaphoresis with hypotension and generalize weakness ; history of asthma and previous tobacco use.  EXAM: CHEST  2 VIEW  COMPARISON:  PA and lateral chest x-ray of September 09, 2013  FINDINGS: The lungs are adequately inflated. There is no focal infiltrate or pleural effusion. The interstitial markings are mildly prominent but not greatly changed from the previous study. The heart and pulmonary vascularity are normal. There is no pneumothorax or pneumomediastinum. The observed portions of the bony thorax are unremarkable.  IMPRESSION: There is no evidence of CHF nor pneumonia. No reaccumulation of a pleural effusion is demonstrated. Coarse interstitial lung markings are likely related to the patient's known reactive airway disease and history of previous tobacco use.   Electronically Signed   By: David  Martinique   On: 12/15/2013  14:29   Echocardiogram - 07/29/13 Left ventricle: The cavity size was normal. Systolic  function was normal. The estimated ejection fraction was in the range of 60% to 65%. - Inferior vena cava: The vessel was normal in size; the respirophasic diameter changes were in the normal range (= 50%); findings are consistent with normal central venous pressure. - Pericardium, extracardiac: Small cirumferential pericardial effusion. There appears to be a gelatinous material overlying the apex of the heart in the pericardium which may be thrombus, epicardial fat or tumor. No signs of tamponade physiology. Impressions:  - Compared to the prior recent echo on 07/17/13, there has been a small decrease in pericardial fluid.  ECG: Sinus bradycardia, non-specific ST-T wave abnormalities, unchanged from prior  Telemetry: Sinus bradycardia with HR 38-51   ASSESSMENT AND PLAN  1. Bradycardia, hypotension 2. Paroxysmal atrial fibrillation on flecainide 3. Hyperlipidemia 4. Hypertension 5. Recent history of pericarditis/pericardial effusion status post pericardial window 6. Pulmonary embolus 7. Morbid obesity 8. Obstructive sleep apnea on CPAP  68 year old male with complex history (see above) admitted with   1. hypotension, diaphoresis, hot/cold feeling that started this morning and got worse post-exertion at the cardiac rehab center. The patient was found to be hypotensive and bradycardic. Telemetry is showing persistent sinus bradycardia with HR as low as 38 BPM. I would recommend to hold metoprolol.  The patient possibly has an underlying infection with dehydration - high BUN, crea, I would recommend careful hydration, he is currently rather dry and shows no evidence of fluid overload. Hold lasix.  2. H/o pericardial effusion - s/p pericardial window, I am not concerned about recurrence as he is brady and not tachycardic.   3. Parox a-fib - cont flecanide for now   4. Pulmonary embolus -    Continue apixaban    Signed, Dorothy Spark, MD, [facility] 12/15/2013, 8:29 PM

## 2013-12-15 NOTE — ED Notes (Signed)
CBG 93 

## 2013-12-15 NOTE — ED Provider Notes (Signed)
I saw and evaluated the patient, reviewed the resident's note and I agree with the findings and plan.  Please see my separate note regarding my evaluation of the patient.  Clinical Impression:    Final diagnoses:  Weakness  Chronic diastolic congestive heart failure  Hypotension, unspecified  Morbid obesity  PAF (paroxysmal atrial fibrillation)  Sinus bradycardia     Johnna Acosta, MD 12/15/13 2338

## 2013-12-15 NOTE — ED Provider Notes (Signed)
68 y/o male with hx recent pericarditis / cardiac window with drain - now in cardiac rehab - was found to be hypotensive and bradycardic today with diaphoresis.  On exam on arrival pt is persistently bradycardic and has ECG showing same - no findings of heart block but BP is soft and requiring fluids.  Pt has soft abd, no LE edema and clear conj / MM without any other findings.  Admitted for observation given the ongoing bradycardia.  I saw and evaluated the patient, reviewed the resident's note and I agree with the findings and plan.   EKG Interpretation  Date/Time:  Monday December 15 2013 13:50:00 EDT Ventricular Rate:  52 PR Interval:  156 QRS Duration: 90 QT Interval:  450 QTC Calculation: 418 R Axis:   88 Text Interpretation:  Sinus bradycardia Nonspecific ST and T wave abnormality Abnormal ECG since last tracing no significant change Confirmed by Adella Manolis  MD, Florien ([id number]) on 12/15/2013 6:17:50 PM      Final diagnoses:  Weakness  Chronic diastolic congestive heart failure  Hypotension, unspecified  Morbid obesity  PAF (paroxysmal atrial fibrillation)  Sinus bradycardia     Johnna Acosta, MD 12/15/13 2338

## 2013-12-15 NOTE — Telephone Encounter (Signed)
Received call from [facility] at Cardiac Rehab.She will page Dr.[name] since he is in hospital today to report his low standing B/P.

## 2013-12-15 NOTE — Progress Notes (Signed)
Pt hypotensive post exercise at cardiac rehab. BP: 94/57 pt c/o lightheadedness.  Recheck:   88/47 standing, 107/70 sitting.  Pt c/o feeling lightheaded.  Pt sweating perfusely with exercise today. Pt denies chest pain, dyspnea, edema. Pt drank 12oz water and 8oz gatorade. Recheck BP:  91/69 standing. Pt continues to c/o lightheadness. Telemetry-nsr HR-60.  15 minute recheck:  86/64.  Reviewed with Dr. Debara Pickett.  Per Dr. Debara Pickett pt taken to Ed via wheelchair

## 2013-12-16 ENCOUNTER — Inpatient Hospital Stay (HOSPITAL_COMMUNITY): Payer: Medicare Other

## 2013-12-16 DIAGNOSIS — R5383 Other fatigue: Secondary | ICD-10-CM

## 2013-12-16 DIAGNOSIS — R5381 Other malaise: Secondary | ICD-10-CM

## 2013-12-16 DIAGNOSIS — I517 Cardiomegaly: Secondary | ICD-10-CM

## 2013-12-16 LAB — COMPREHENSIVE METABOLIC PANEL
ALK PHOS: 69 U/L (ref 39–117)
ALT: 13 U/L (ref 0–53)
ANION GAP: 13 (ref 5–15)
AST: 19 U/L (ref 0–37)
Albumin: 3 g/dL — ABNORMAL LOW (ref 3.5–5.2)
BILIRUBIN TOTAL: 0.7 mg/dL (ref 0.3–1.2)
BUN: 28 mg/dL — ABNORMAL HIGH (ref 6–23)
CALCIUM: 8.4 mg/dL (ref 8.4–10.5)
CO2: 26 meq/L (ref 19–32)
Chloride: 103 mEq/L (ref 96–112)
Creatinine, Ser: 1.25 mg/dL (ref 0.50–1.35)
GFR calc Af Amer: 67 mL/min — ABNORMAL LOW (ref >90)
GFR calc non Af Amer: 58 mL/min — ABNORMAL LOW (ref >90)
GLUCOSE: 96 mg/dL (ref 70–99)
POTASSIUM: 3.7 meq/L (ref 3.7–5.3)
SODIUM: 142 meq/L (ref 137–147)
Total Protein: 6.2 g/dL (ref 6.0–8.3)

## 2013-12-16 LAB — CBC WITH DIFFERENTIAL/PLATELET
BASOS ABS: 0.1 10*3/uL (ref 0.0–0.1)
Basophils Relative: 1 % (ref 0–1)
Eosinophils Absolute: 0.2 10*3/uL (ref 0.0–0.7)
Eosinophils Relative: 2 % (ref 0–5)
HEMATOCRIT: 40.1 % (ref 39.0–52.0)
HEMOGLOBIN: 13.5 g/dL (ref 13.0–17.0)
LYMPHS ABS: 2.2 10*3/uL (ref 0.7–4.0)
Lymphocytes Relative: 26 % (ref 12–46)
MCH: 26.7 pg (ref 26.0–34.0)
MCHC: 33.7 g/dL (ref 30.0–36.0)
MCV: 79.2 fL (ref 78.0–100.0)
MONO ABS: 0.9 10*3/uL (ref 0.1–1.0)
MONOS PCT: 11 % (ref 3–12)
NEUTROS ABS: 5.1 10*3/uL (ref 1.7–7.7)
Neutrophils Relative %: 60 % (ref 43–77)
Platelets: 169 10*3/uL (ref 150–400)
RBC: 5.06 MIL/uL (ref 4.22–5.81)
RDW: 18 % — ABNORMAL HIGH (ref 11.5–15.5)
WBC: 8.4 10*3/uL (ref 4.0–10.5)

## 2013-12-16 LAB — TROPONIN I: Troponin I: <0.3 ng/mL (ref <0.30)

## 2013-12-16 LAB — PRO B NATRIURETIC PEPTIDE: PRO B NATRI PEPTIDE: 131 pg/mL — AB (ref 0–125)

## 2013-12-16 LAB — GLUCOSE, CAPILLARY: GLUCOSE-CAPILLARY: 119 mg/dL — AB (ref 70–99)

## 2013-12-16 MED ORDER — POTASSIUM CHLORIDE CRYS ER 10 MEQ PO TBCR: 10.0000 meq | EXTENDED_RELEASE_TABLET | Freq: Every day | ORAL | Status: DC | PRN

## 2013-12-16 MED ORDER — PERFLUTREN LIPID MICROSPHERE
1.0000 mL | INTRAVENOUS | Status: AC | PRN
  Administered 2013-12-16: 3 mL via INTRAVENOUS
  Filled 2013-12-16: qty 10

## 2013-12-16 MED ORDER — FUROSEMIDE 20 MG PO TABS: 20.0000 mg | ORAL_TABLET | Freq: Every day | ORAL | Status: DC | PRN

## 2013-12-16 NOTE — Progress Notes (Signed)
  Echocardiogram 2D Echocardiogram has been performed.  Darlina Sicilian M 12/16/2013, 9:54 AM

## 2013-12-16 NOTE — Progress Notes (Signed)
Placed pt on CPAP per home settings of 16 cmH2O with large mask. Pt tolerating well at this time.

## 2013-12-16 NOTE — Progress Notes (Signed)
Pt is being discharged to home. Reviewed pt's d/c papers with pt and his wife and they had no questions. Pt is going to make an apt. With cardiology tomorrow. Pt is alert and oriented. Pt has no signs or symptoms of dizziness, sob or weakness at this time. Pt's heart rate was last noted at 50. Pt is going home with wife.

## 2013-12-16 NOTE — Discharge Summary (Signed)
DISCHARGE SUMMARY  [name]  MR#: [id number]  DOB:05-28-1945  Date of Admission: 12/15/2013 Date of Discharge: 12/16/2013  Attending Physician:[name],[name] T  Patient's UYQ:[id number] [name] Darner, MD  Consults: [facility] Cardiology  Disposition: D/C home   Follow-up Appts:     Follow-up Information   Follow up with [name],[name] C, MD. Schedule an appointment as soon as possible for a visit in 1 week.   Specialty:  Cardiology   Contact information:   [address] [place] [address] [phone]      Discharge Diagnoses: Bradycardia, hypotension  Paroxysmal atrial fibrillation on flecainide  Hyperlipidemia  Hypertension  Recent history of pericarditis/pericardial effusion status post pericardial window  Pulmonary embolus  Morbid obesity  Obstructive sleep apnea on CPAP  Initial presentation: 68 year old male with extensive prior medical history noted for hospitalization 4/27-5/4 for pericarditis with pericardial effusion, paroxysmal afib on eliquis, hx/o PE, as well as UGI, and diastolic CHF who presented with weakness and diaphoresis during cardiac rehab. Pt stated he woke up feeling generally weak. Pt was noted to be very weak during cardiac rehab w/ BPs in 80s and HR in 40s-50s. Denied CP, SOB, syncope, or confusion. Pt attempted to exercise when he became severely diaphoretic and weak. Staff at cardiac rehab contacted pt's cardiologist ([facility]) who recommended pt come to the ER.  On arrival to ER pt with noted HR in 40s-50s. BP in 80s-120s. Afebrile. Satting > 95% on RA. Bloodwork essentially WNL apart from Cr 1.38 (baseline cr 1.1), Glu 58. Trop neg x2. EKG w sinus bradycardia and non specific ST/T wave changes. Cards consulted on case.   Hospital Course: He remained in SR/bradycardia with rate >50. Remained clinically stable. Continued flecainide + Apixaban. No beta blocker. SP IV hydration. Recheck orthostatic vitals w/o significant orthostasis.   No acute intracranial abnormality on MRI. No CHF or PNA on CXR. Echo completed EF 65% Normal No effusion. Cleared for d/c home per Cardiology.  Pt w/o new complaints and anxious for d/c home.    Follow up with Dr. Debara [name] in 1-2 weeks.    Medication List    STOP taking these medications       metoprolol tartrate 25 MG tablet  Commonly known as:  LOPRESSOR      TAKE these medications       apixaban 5 MG Tabs tablet  Commonly known as:  ELIQUIS  Take 5 mg by mouth 2 (two) times daily.     budesonide-formoterol 160-4.5 MCG/ACT inhaler  Commonly known as:  SYMBICORT  Inhale 2 puffs into the lungs 2 (two) times daily.     CALCIUM 500 PO  Take 1 tablet by mouth daily.     CALCIUM PO  Take 1 tablet by mouth 3 (three) times daily.     DSS 100 MG Caps  Take 100 mg by mouth every other day.     finasteride 5 MG tablet  Commonly known as:  PROSCAR  Take 5 mg by mouth daily.     flecainide 50 MG tablet  Commonly known as:  TAMBOCOR  Take 1 tablet (50 mg total) by mouth every 12 (twelve) hours.     fluticasone 50 MCG/ACT nasal spray  Commonly known as:  FLONASE  Place 2 sprays into both nostrils daily.     furosemide 20 MG tablet  Commonly known as:  LASIX  Take 1 tablet (20 mg total) by mouth daily as needed (as needed for swelling in the legs).  loratadine 10 MG tablet  Commonly known as:  CLARITIN  Take 1 tablet (10 mg total) by mouth daily.     LORazepam 0.5 MG tablet  Commonly known as:  ATIVAN  Take 0.5 mg by mouth 3 (three) times daily. Take 0.5 in the AM and 2.5mg  in the PM     montelukast 10 MG tablet  Commonly known as:  SINGULAIR  Take 1 tablet (10 mg total) by mouth at bedtime.     multivitamin tablet  Take 1 tablet by mouth daily. Chewable tablet with iron     pantoprazole 40 MG tablet  Commonly known as:  PROTONIX  Take 40 mg by mouth 2 (two) times daily.     polyethylene glycol packet  Commonly known as:  MIRALAX / GLYCOLAX  Take 17 g by mouth  daily.     potassium chloride 10 MEQ tablet  Commonly known as:  K-DUR,KLOR-CON  Take 1 tablet (10 mEq total) by mouth daily as needed (take only when you take your lasix (furosemide) pill).     risperiDONE 0.5 MG tablet  Commonly known as:  RISPERDAL  Take 1 tablet (0.5 mg total) by mouth at bedtime.     sertraline 100 MG tablet  Commonly known as:  ZOLOFT  Take 1.5 tablets (150 mg total) by mouth daily with breakfast.     VISINE OP  Place 2 drops into both eyes daily as needed (dry eyes).     Vitamin B-12 1000 MCG Subl  Place 1,000 mcg under the tongue every morning.       Day of Discharge BP 116/63  Pulse 53  Temp(Src) 97.8 F (36.6 C) (Oral)  Resp 19  Ht 6' (1.829 m)  Wt 134.7 kg (296 lb 15.4 oz)  BMI 40.27 kg/m2  SpO2 95%  Physical Exam: General: No acute respiratory distress Lungs: Clear to auscultation bilaterally without wheezes or crackles Cardiovascular: Regular rate and rhythm without murmur gallop or rub normal S1 and S2 Abdomen: Nontender, nondistended, soft, bowel sounds positive, no rebound, no ascites, no appreciable mass Extremities: No significant cyanosis, clubbing, or edema bilateral lower extremities  Results for orders placed during the hospital encounter of 12/15/13 (from the past 24 hour(s))  CBG MONITORING, ED     Status: None   Collection Time    12/15/13  5:22 PM      Result Value Ref Range   Glucose-Capillary 81  70 - 99 mg/dL  I-STAT TROPOININ, ED     Status: None   Collection Time    12/15/13  6:03 PM      Result Value Ref Range   Troponin i, poc 0.00  0.00 - 0.08 ng/mL   Comment 3           TSH     Status: None   Collection Time    12/15/13  8:02 PM      Result Value Ref Range   TSH 3.700  0.350 - 4.500 uIU/mL  CBG MONITORING, ED     Status: None   Collection Time    12/15/13  8:32 PM      Result Value Ref Range   Glucose-Capillary 93  70 - 99 mg/dL  MRSA PCR SCREENING     Status: None   Collection Time    12/15/13  9:18  PM      Result Value Ref Range   MRSA by PCR NEGATIVE  NEGATIVE  TROPONIN I     Status: None  Collection Time    12/15/13 11:37 PM      Result Value Ref Range   Troponin I <0.30  <0.30 ng/mL  PRO B NATRIURETIC PEPTIDE     Status: Abnormal   Collection Time    12/15/13 11:37 PM      Result Value Ref Range   Pro B Natriuretic peptide (BNP) 131.0 (*) 0 - 125 pg/mL  COMPREHENSIVE METABOLIC PANEL     Status: Abnormal   Collection Time    12/16/13  2:15 AM      Result Value Ref Range   Sodium 142  137 - 147 mEq/L   Potassium 3.7  3.7 - 5.3 mEq/L   Chloride 103  96 - 112 mEq/L   CO2 26  19 - 32 mEq/L   Glucose, Bld 96  70 - 99 mg/dL   BUN 28 (*) 6 - 23 mg/dL   Creatinine, Ser 1.25  0.50 - 1.35 mg/dL   Calcium 8.4  8.4 - 10.5 mg/dL   Total Protein 6.2  6.0 - 8.3 g/dL   Albumin 3.0 (*) 3.5 - 5.2 g/dL   AST 19  0 - 37 U/L   ALT 13  0 - 53 U/L   Alkaline Phosphatase 69  39 - 117 U/L   Total Bilirubin 0.7  0.3 - 1.2 mg/dL   GFR calc non Af Amer 58 (*) >90 mL/min   GFR calc Af Amer 67 (*) >90 mL/min   Anion gap 13  5 - 15  CBC WITH DIFFERENTIAL     Status: Abnormal   Collection Time    12/16/13  2:15 AM      Result Value Ref Range   WBC 8.4  4.0 - 10.5 K/uL   RBC 5.06  4.22 - 5.81 MIL/uL   Hemoglobin 13.5  13.0 - 17.0 g/dL   HCT 40.1  39.0 - 52.0 %   MCV 79.2  78.0 - 100.0 fL   MCH 26.7  26.0 - 34.0 pg   MCHC 33.7  30.0 - 36.0 g/dL   RDW 18.0 (*) 11.5 - 15.5 %   Platelets 169  150 - 400 K/uL   Neutrophils Relative % 60  43 - 77 %   Neutro Abs 5.1  1.7 - 7.7 K/uL   Lymphocytes Relative 26  12 - 46 %   Lymphs Abs 2.2  0.7 - 4.0 K/uL   Monocytes Relative 11  3 - 12 %   Monocytes Absolute 0.9  0.1 - 1.0 K/uL   Eosinophils Relative 2  0 - 5 %   Eosinophils Absolute 0.2  0.0 - 0.7 K/uL   Basophils Relative 1  0 - 1 %   Basophils Absolute 0.1  0.0 - 0.1 K/uL  TROPONIN I     Status: None   Collection Time    12/16/13  2:15 AM      Result Value Ref Range   Troponin I <0.30   <0.30 ng/mL  TROPONIN I     Status: None   Collection Time    12/16/13  8:19 AM      Result Value Ref Range   Troponin I <0.30  <0.30 ng/mL  GLUCOSE, CAPILLARY     Status: Abnormal   Collection Time    12/16/13  8:33 AM      Result Value Ref Range   Glucose-Capillary 119 (*) 70 - 99 mg/dL    Time spent in discharge (includes decision making & examination of pt): 30 minutes  12/16/2013, 2:50 PM   Cherene Altes, MD [facility] Office  [phone] Pager [phone]  On-Call/Text Page:      [email or web address]      password [facility]

## 2013-12-16 NOTE — Progress Notes (Signed)
Subjective: He feels better  Objective: Vital signs in last 24 hours: Temp:  [97.9 F (36.6 C)-98.7 F (37.1 C)] 98 F (36.7 C) (09/15 0832) Pulse Rate:  [40-59] 46 (09/15 0832) Resp:  [6-23] 15 (09/15 0832) BP: (92-138)/(53-80) 108/74 mmHg (09/15 0832) SpO2:  [94 %-100 %] 96 % (09/15 0832) Weight:  [296 lb 15.4 oz (134.7 kg)-298 lb (135.172 kg)] 296 lb 15.4 oz (134.7 kg) (09/15 0500)    Intake/Output from previous day: 09/14 0701 - 09/15 0700 In: 923.3 [I.V.:923.3] Out: -  Intake/Output this shift: Total I/O In: -  Out: 300 [Urine:300]  Medications Current Facility-Administered Medications  Medication Dose Route Frequency Provider Last Rate Last Dose  . 0.9 %  sodium chloride infusion   Intravenous Continuous Shanda Howells, MD 100 mL/hr at 12/16/13 0610    . apixaban (ELIQUIS) tablet 5 mg  5 mg Oral BID Shanda Howells, MD   5 mg at 12/16/13 6948  . budesonide-formoterol (SYMBICORT) 160-4.5 MCG/ACT inhaler 2 puff  2 puff Inhalation BID Shanda Howells, MD      . finasteride (PROSCAR) tablet 5 mg  5 mg Oral Daily Shanda Howells, MD   5 mg at 12/16/13 5462  . flecainide (TAMBOCOR) tablet 50 mg  50 mg Oral Q12H Shanda Howells, MD   50 mg at 12/16/13 0939  . fluticasone (FLONASE) 50 MCG/ACT nasal spray 2 spray  2 spray Each Nare Daily Shanda Howells, MD      . loratadine (CLARITIN) tablet 10 mg  10 mg Oral Daily Shanda Howells, MD   10 mg at 12/16/13 0939  . LORazepam (ATIVAN) tablet 0.5 mg  0.5 mg Oral TID Shanda Howells, MD   0.5 mg at 12/16/13 0940  . montelukast (SINGULAIR) tablet 10 mg  10 mg Oral QHS Shanda Howells, MD   10 mg at 12/15/13 2306  . multivitamin with minerals tablet 1 tablet  1 tablet Oral Daily Shanda Howells, MD   1 tablet at 12/16/13 0940  . pantoprazole (PROTONIX) EC tablet 40 mg  40 mg Oral BID Shanda Howells, MD   40 mg at 12/16/13 0940  . perflutren lipid microspheres (DEFINITY) IV suspension  1-10 mL Intravenous PRN Cherene Altes, MD   3 mL at 12/16/13  0948  . polyvinyl alcohol (LIQUIFILM TEARS) 1.4 % ophthalmic solution 1 drop  1 drop Both Eyes BID PRN Shanda Howells, MD      . risperiDONE (RISPERDAL) tablet 0.5 mg  0.5 mg Oral QHS Shanda Howells, MD   0.5 mg at 12/15/13 2305  . sertraline (ZOLOFT) tablet 150 mg  150 mg Oral Q breakfast Shanda Howells, MD   150 mg at 12/16/13 0940    PE: General appearance: alert, cooperative and no distress Lungs: clear to auscultation bilaterally Heart: regular rate and rhythm, S1, S2 normal, no murmur, click, rub or gallop Extremities: No LEE Pulses: 2+ and symmetric Skin: Warm and dry Neurologic: Grossly normal  Lab Results:   Recent Labs  12/15/13 1346 12/16/13 0215  WBC 8.7 8.4  HGB 14.4 13.5  HCT 43.6 40.1  PLT 214 169   BMET  Recent Labs  12/15/13 1346 12/16/13 0215  NA 140 142  K 4.5 3.7  CL 99 103  CO2 29 26  GLUCOSE 56* 96  BUN 29* 28*  CREATININE 1.38* 1.25  CALCIUM 9.2 8.4     Assessment/Plan  Active Problems: 1. Bradycardia, hypotension 2. Paroxysmal atrial fibrillation on flecainide 3. Hyperlipidemia 4. Hypertension 5. Recent history of  pericarditis/pericardial effusion status post pericardial window 6. Pulmonary embolus 7. Morbid obesity 8. Obstructive sleep apnea on CPAP   He remains in SR/bradycardia rate >50.  Appears stable.  Continue flecainide. Apixaban.  No beta blocker.  SP IV hydration.  Recheck orthostatic vitals.  No acute intracranial abnormality on MRI.  No CHF or PNA on CXR.  Echo completed-results pending.  The patient reports usual fluid intake in take at home.  He continues to take 20mg  lasix daily.  I think this should be PRN Daily dosing.   If echo and orthostatic vitals ok, I think he can be DCd home.      LOS: 1 day    HAGER, BRYAN PA-C 12/16/2013 10:29 AM  Personally seen and examined. Agree with above. ECHO EF 65%. Normal. No effusion.  Continue flecainide. Apixaban.  No beta blocker OK with DC home. Will have follow up with  Dr. Debara Pickett in 1-2 weeks.   Candee Furbish, MD

## 2013-12-16 NOTE — Progress Notes (Signed)
Duplicate, enterred in error  

## 2013-12-16 NOTE — Discharge Instructions (Signed)
Bradycardia °Bradycardia is a term for a heart rate (pulse) that, in adults, is slower than 60 beats per minute. A normal rate is 60 to 100 beats per minute. A heart rate below 60 beats per minute may be normal for some adults with healthy hearts. If the rate is too slow, the heart may have trouble pumping the volume of blood the body needs. If the heart rate gets too low, blood flow to the brain may be decreased and may make you feel lightheaded, dizzy, or faint. °The heart has a natural pacemaker in the top of the heart called the SA node (sinoatrial or sinus node). This pacemaker sends out regular electrical signals to the muscle of the heart, telling the heart muscle when to beat (contract). The electrical signal travels from the upper parts of the heart (atria) through the AV node (atrioventricular node), to the lower chambers of the heart (ventricles). The ventricles squeeze, pumping the blood from your heart to your lungs and to the rest of your body. °CAUSES  °· Problem with the heart's electrical system. °· Problem with the heart's natural pacemaker. °· Heart disease, damage, or infection. °· Medications. °· Problems with minerals and salts (electrolytes). °SYMPTOMS  °· Fainting (syncope). °· Fatigue and weakness. °· Shortness of breath (dyspnea). °· Chest pain (angina). °· Drowsiness. °· Confusion. °DIAGNOSIS  °· An electrocardiogram (ECG) can help your caregiver determine the type of slow heart rate you have. °· If the cause is not seen on an ECG, you may need to wear a heart monitor that records your heart rhythm for several hours or days. °· Blood tests. °TREATMENT  °· Electrolyte supplements. °· Medications. °· Withholding medication which is causing a slow heart rate. °· Pacemaker placement. °SEEK IMMEDIATE MEDICAL CARE IF:  °· You feel lightheaded or faint. °· You develop an irregular heart rate. °· You feel chest pain or have trouble breathing. °MAKE SURE YOU:  °· Understand these  instructions. °· Will watch your condition. °· Will get help right away if you are not doing well or get worse. °Document Released: 12/10/2001 Document Revised: 06/12/2011 Document Reviewed: 06/25/2013 °Exit[employer]® Patient Information ©2015 [employer]. This information is not intended to replace advice given to you by your health care provider. Make sure you discuss any questions you have with your health care provider. ° °

## 2013-12-17 ENCOUNTER — Encounter (HOSPITAL_COMMUNITY): Admission: RE | Admit: 2013-12-17 | Payer: Self-pay | Source: Ambulatory Visit

## 2013-12-17 ENCOUNTER — Telehealth: Payer: Self-pay | Admitting: Internal Medicine

## 2013-12-17 ENCOUNTER — Telehealth: Payer: Self-pay | Admitting: *Deleted

## 2013-12-17 NOTE — Telephone Encounter (Signed)
Transition Care Management Follow-up Telephone Call How have you been since you were released from the hospital?  Pt stated he felt good yesterday, but this morning he is tired. But other than that he is fine  Do you understand why you were in the hospital? YES, he stated he understood why he was admitted  Do you understand the discharge instrcutions? YES, he stated the hospital nurse went over discharge papers with him, but we went over summary again  Items Reviewed:  Medications reviewed: YES, went over medications verified if he stop taking the metoprolol and he stated yes  Allergies reviewed: {YES, no changes on his allergies  Dietary changes reviewed: YES  Referrals reviewed: no referral was advise only to f/u with his cardiologist and he stated he was going to call them today   Functional Questionnaire:   Activities of Daily Living (ADLs):   He states he independent he is able to dress, shower, walk, ext himself  He stated he didn't need any asstance    Any transportation issues/concerns? NO  Any patient concerns? NO   Confirmed importance and date/time of follow-up visits scheduled: YES, made TCM appt for 12/23/13 @ 1:00   Confirmed with patient if condition begins to worsen call PCP or go to the ER.  Patient was given the Call-a-Nurse line [phone]: YES, provided call-a-nurse #

## 2013-12-17 NOTE — Telephone Encounter (Signed)
Called pt and gave him appt date and time

## 2013-12-17 NOTE — Telephone Encounter (Signed)
Thanks .[name] Was reasonable to stop his b-blocker.  Dr. Lemmie Evens

## 2013-12-17 NOTE — Telephone Encounter (Signed)
Spoke with patient. He had an episode of bradycardia and hypotension while in cardiac rehab and Dr. Debara Pickett had suggested he go to ED for eval on Monday 9/14. Patient went to ED, tests were performed, and metoprolol tartrate (beta-blocker) was d/c'ed. Patient was told to follow up with Dr. Debara Pickett (or extender) in office for evaluation, so that he may resume cardiac rehab. Patient was hoping that Dr. Debara Pickett could see him and his wife together for OV on 9/21 when she has an appointment that afternoon, and RN explained that Dr. Debara Pickett is already double booked that day and has to leave the office early (meeting). RN explained to him that she was not trained to do scheduling and that it is difficult to view all available providers schedules at once in order to see what is available for him. Patient is understanding of this circumstance. Message has been sent to scheduler to assist in finding a first available appointment for patient and/or adjusting current schedule to meet patient's needs.

## 2013-12-18 NOTE — Telephone Encounter (Signed)
OV 9/25

## 2013-12-19 ENCOUNTER — Ambulatory Visit (INDEPENDENT_AMBULATORY_CARE_PROVIDER_SITE_OTHER): Payer: Medicare Other | Admitting: Internal Medicine

## 2013-12-19 ENCOUNTER — Encounter: Payer: Self-pay | Admitting: Internal Medicine

## 2013-12-19 ENCOUNTER — Encounter (HOSPITAL_COMMUNITY): Admission: RE | Admit: 2013-12-19 | Payer: Self-pay | Source: Ambulatory Visit

## 2013-12-19 VITALS — BP 80/60 | HR 80 | Temp 98.1°F | Resp 14 | Wt 303.4 lb

## 2013-12-19 DIAGNOSIS — F3289 Other specified depressive episodes: Secondary | ICD-10-CM

## 2013-12-19 DIAGNOSIS — E162 Hypoglycemia, unspecified: Secondary | ICD-10-CM

## 2013-12-19 DIAGNOSIS — R61 Generalized hyperhidrosis: Secondary | ICD-10-CM | POA: Diagnosis not present

## 2013-12-19 DIAGNOSIS — I9589 Other hypotension: Secondary | ICD-10-CM | POA: Diagnosis not present

## 2013-12-19 DIAGNOSIS — F329 Major depressive disorder, single episode, unspecified: Secondary | ICD-10-CM | POA: Diagnosis not present

## 2013-12-19 NOTE — Patient Instructions (Addendum)
I recommend a Nutrition consultation with Ms. Williams to determine optimal therapy. Follow up with Dr Adele Schilder as scheduled.

## 2013-12-19 NOTE — Progress Notes (Signed)
   Subjective:    Patient ID: [name], male    DOB: [birth year], 68 y.o.   MRN: [id number]  HPI   The hospital records 9/14-9/15/15 were reviewed. He presented with generalized weakness which began in cardiac rehabilitation associated with profound diaphoresis. His glucose in Rehab was 53.  He did 3 ten minute exercise cycles on different machines during the period 11:15-12:30 PM at cardiac rehabilitation. That morning for breakfast he had a protein shake and 2 cups of coffee with creamer. The glucose of 53 was drawn between 12:30 and 1.  In the emergency room he was found to have bradycardia with heart rates in the 40s to 50s. Extensive imaging and labs were performed. There were no significant abnormalities.BNP was 131; GFR 58; chest x-ray revealed increased interstitial markings. There was no evidence of pleural effusion or cardiac decompensation. His TSH was therapeutic. He was discharged with discontinuation of the metoprolol.  He continues to be weak without other localizing signs.  With the bariatric surgery he has lost 100 pounds. He continues to follow the bariatric program diet postoperatively. He questions whether this may be playing a role in that low blood sugar. His weight has been essentially stable.     Review of Systems  Chest pain, palpitations, tachycardia, exertional dyspnea, paroxysmal nocturnal dyspnea, claudication or edema are absent.  Despite 100 down weight loss on bariatric surgery; he remains on 150 mg of sertraline.  He does describe some jerks in his legs. He denies episodes of confusion, restlessness, disorientation, tachycardia, vomiting, diarrhea as would be expected with serotonin syndrome.         Objective:   Physical Exam  Positive or pertinent findings: Severe obesity as per CDC guidelines. Massive abdomen. Heart sounds somewhat distant without murmur or other abnormal sounds. Skin is cool and damp. Trace edema noted of the lower  extremities.  General appearance :adequately nourished; in no distress. Eyes: No conjunctival inflammation or scleral icterus is present. Oral exam: Lips and gums are healthy appearing.There is no oropharyngeal erythema or exudate noted.  Lungs:Chest clear to auscultation; no wheezes, rhonchi,rales ,or rubs present.No increased work of breathing.  Abdomen: bowel sounds normal, soft and non-tender without masses, organomegaly or hernias noted.  No guarding or rebound. Skin: Intact without suspicious lesions or rashes ; no jaundice or tenting Lymphatic: No lymphadenopathy is noted about the head, neck, axilla           Assessment & Plan:  #1bradycardia #2 diaphoresis #3 hypoglycemia Above in context of Bariatric surgery Plan: review nutrition with Roxan Hockey , MS,RD,LDN  Fax [phone]. R/O reactive hypoglycemia . Her expertise is critical . Serotonin syndrome clinically unlikely; I defer to Dr Adele Schilder as to adjustment of meds in context of 100 # weight loss post op

## 2013-12-19 NOTE — Progress Notes (Signed)
Pre visit review using our clinic review tool, if applicable. No additional management support is needed unless otherwise documented below in the visit note. 

## 2013-12-22 ENCOUNTER — Encounter (HOSPITAL_COMMUNITY): Payer: Self-pay

## 2013-12-22 ENCOUNTER — Telehealth: Payer: Self-pay

## 2013-12-22 ENCOUNTER — Other Ambulatory Visit: Payer: Self-pay

## 2013-12-22 DIAGNOSIS — R059 Cough, unspecified: Secondary | ICD-10-CM

## 2013-12-22 DIAGNOSIS — R05 Cough: Secondary | ICD-10-CM

## 2013-12-22 MED ORDER — MONTELUKAST SODIUM 10 MG PO TABS: 10.0000 mg | ORAL_TABLET | Freq: Every day | ORAL | Status: DC

## 2013-12-22 NOTE — Telephone Encounter (Signed)
12/19/13 office note has been faxed to both locations

## 2013-12-22 NOTE — Telephone Encounter (Signed)
Message copied by Shelly Coss on Mon Dec 22, 2013  8:14 AM ------      Message from: Hendricks Limes      Created: Sat Dec 20, 2013  6:46 AM       Please FAX  12/19/13 office visit  to Dr Berniece Andreas, [place] to Ms wiiliams, Nutrionist (FAX # in note). Thanks very much. Hopp ------

## 2013-12-24 ENCOUNTER — Encounter (HOSPITAL_COMMUNITY): Payer: Self-pay

## 2013-12-26 ENCOUNTER — Encounter: Payer: Self-pay | Admitting: Physician Assistant

## 2013-12-26 ENCOUNTER — Encounter (HOSPITAL_COMMUNITY): Payer: Self-pay

## 2013-12-26 ENCOUNTER — Ambulatory Visit (INDEPENDENT_AMBULATORY_CARE_PROVIDER_SITE_OTHER): Payer: Medicare Other | Admitting: Physician Assistant

## 2013-12-26 VITALS — BP 132/82 | HR 72 | Ht 72.0 in | Wt 310.2 lb

## 2013-12-26 DIAGNOSIS — I9589 Other hypotension: Secondary | ICD-10-CM

## 2013-12-26 DIAGNOSIS — I498 Other specified cardiac arrhythmias: Secondary | ICD-10-CM

## 2013-12-26 DIAGNOSIS — G4730 Sleep apnea, unspecified: Secondary | ICD-10-CM

## 2013-12-26 DIAGNOSIS — I2699 Other pulmonary embolism without acute cor pulmonale: Secondary | ICD-10-CM

## 2013-12-26 DIAGNOSIS — I4891 Unspecified atrial fibrillation: Secondary | ICD-10-CM | POA: Diagnosis not present

## 2013-12-26 DIAGNOSIS — I480 Paroxysmal atrial fibrillation: Secondary | ICD-10-CM

## 2013-12-26 DIAGNOSIS — R001 Bradycardia, unspecified: Secondary | ICD-10-CM

## 2013-12-26 NOTE — Progress Notes (Signed)
Date:  12/26/2013   ID:  [name], DOB [birth year], MRN [id number]  PCP:  [name], MD  Primary Cardiologist:  Debara Pickett    History of Present Illness: [name] is a 68 y.o. male with a history of bariatric surgery, one year ago, lost about 85 pounds before and after surgery. Also has a history of hypertension and sleep apnea. He has been admitted twice in May 2015 with an unusual presentation with multiple medical problems, including pulmonary embolus, moderate to large pericardial effusion, new onset a-fib and GI bleed. His PE was diagnosed by his PCP through outpatient w/u and he was placed on Xarelto. His first hospital admission was from 4/15-4/24. At that time his CC was chest pain. W/u suggested a diagnosis of pericarditis. An Echocardiogram was done, which showed moderate to large pericardial effusion. Meanwhile the night of 4/16 he went in to afib with RVR, probably secondary to PE and was transferred to step down and started on cardizem drip. He converted to sinus the same night , cardizem discontinued and he was resumed on metoprolol. CT surgery was consulted for pericardial window. He underwent pericardial window on 4/17 and hemorrhagic fluid was drained and was sent for analysis. Gram stain and cultures were negative. He was also started on colchicine for his pericarditis. The evening of 4/20 pt went back in to afib with RVR and had bloody bowel movement. His xarelto was discontinued and he was put on cardizem gtt. Over the next 24 hours his hemoglobin dropped to 7.8 and he received 2 units of prbc transfusion. His repeat H&H improved. Dr Henrene Pastor was consulted from GI on 4/20 and he underwent endoscopy on 4/21 . He was found to have superficial ulcers in the stomach, underwent biopsy of the area. He was recommended to continue on PPI twice daily for 2 weeks and resume xarelto. He was discharged home then returned 3 days later on 07/28/13 with a complaint of increased fatigue  and SOB. Repeat 2D echo demonstrated only minimal residual effusion with normal RV and LV function. 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 He was admitted and underwent a repeat EGD which showed healing of ulcers, no bleed. He was continued on PO iron and a PPI. His Hgb at time of discharge was stable at 9.0. He was discharged home. Cardiology had recommended an OP stress test. This was done 08/03/13 and showed normal myocardial perfusion and normal LV EF of 58%.  He was last seen by Dr Debara Pickett in the clinic on 09/29/13 with a marked improvement in his shortness of breath. He was able to do most activities without any difficulty. He was continued on Eliquis for his pulmonary embolus and for stroke prevention. He is on flecainide for antiarrhythmic therapy. He had no chest pain.   He was sent to the ER on 12/15/13 for a hypotensive episode post exercise at cardiac rehab. BP: 94/57 pt c/o lightheadedness. Recheck: 88/47 standing, 107/70 sitting. Pt c/o feeling lightheaded.  He was admitted for IV hydration and discharged the following day.  Patient presents today for post hospital evaluation. He reports doing much better no dizziness or lightheadedness. He is taking the Lasix daily which I don't think he needs to do.  He also denies nausea, vomiting, fever, chest pain, shortness of breath, orthopnea, PND, cough, congestion, abdominal pain, hematochezia, melena, lower extremity edema, claudication.  Wt Readings from Last 3 Encounters:  12/26/13 310 lb 3.2 oz (140.706 kg)  12/19/13 303 lb 6 oz (137.61 kg)  12/16/13 296 lb 15.4 oz (134.7 kg)     Past Medical History  Diagnosis Date  . Benign prostatic hypertrophy     several prostate biopsies neg for cancer.   . Hypertension   . Gout   . Depression   . FH: colonic polyps 2010  . Morbid obesity   . Complication of anesthesia     MORPHINE CAUSES HALLUCINATIONS  . Asthma     childhood asthma  . Numbness     feet / legs  . History of  kidney stones   . Kidney cysts   . Sleep apnea     USES C PAP  . Bilateral hip pain   . PAF (paroxysmal atrial fibrillation) with RVR 07/29/2013  . Anemia 07/29/2013    Current Outpatient Prescriptions  Medication Sig Dispense Refill  . apixaban (ELIQUIS) 5 MG TABS tablet Take 5 mg by mouth 2 (two) times daily.      [name] CALCIUM PO Take 500 mg by mouth 3 (three) times daily.       . Cyanocobalamin (VITAMIN B-12) 1000 MCG SUBL Place 1,000 mcg under the tongue every morning.      [name] Sodium (DSS) 100 MG CAPS Take 100 mg by mouth every other day.      . finasteride (PROSCAR) 5 MG tablet Take 5 mg by mouth daily.       . flecainide (TAMBOCOR) 50 MG tablet Take 1 tablet (50 mg total) by mouth every 12 (twelve) hours.  60 tablet  5  . fluticasone (FLONASE) 50 MCG/ACT nasal spray Place 2 sprays into both nostrils daily.      . furosemide (LASIX) 20 MG tablet Take 1 tablet (20 mg total) by mouth daily as needed (as needed for swelling in the legs).  90 tablet  1  . loratadine (CLARITIN) 10 MG tablet Take 1 tablet (10 mg total) by mouth daily.  30 tablet  5  . LORazepam (ATIVAN) 0.5 MG tablet Take 0.5 mg by mouth 3 (three) times daily. Take 0.5 in the AM and 2.5mg  in the PM      . montelukast (SINGULAIR) 10 MG tablet Take 1 tablet (10 mg total) by mouth at bedtime.  30 tablet  5  . Multiple Vitamin (MULTIVITAMIN) tablet Take 1 tablet by mouth daily. Chewable tablet with iron      . pantoprazole (PROTONIX) 40 MG tablet Take 40 mg by mouth 2 (two) times daily.      . potassium chloride (K-DUR,KLOR-CON) 10 MEQ tablet Take 10 mEq by mouth 3 (three) times daily.      . risperiDONE (RISPERDAL) 0.5 MG tablet Take 1 tablet (0.5 mg total) by mouth at bedtime.  30 tablet  2  . sertraline (ZOLOFT) 100 MG tablet Take 1.5 tablets (150 mg total) by mouth daily with breakfast.  45 tablet  2  . Tetrahydrozoline HCl (VISINE OP) Place 2 drops into both eyes daily as needed (dry eyes).       No current  facility-administered medications for this visit.    Allergies:    Allergies  Allergen Reactions  . Propoxyphene N-Acetaminophen Nausea Only  . Codeine     nausea  . Morphine     Nausea Severe hallucinations  . Penicillins     LOC with shot    Social History:  The patient  reports that he quit smoking about 39 years ago. His smoking use included Cigarettes. He smoked 0.00  packs per day. He does not have any smokeless tobacco history on file. He reports that he drinks alcohol. He reports that he does not use illicit drugs.   Family history:   Family History  Problem Relation Age of Onset  . Depression Mother   . Hypertension Mother   . Heart disease Father     MI  . Depression Brother   . Stroke Paternal Aunt     CVA  . Diabetes Paternal Uncle   . Heart disease Paternal Uncle     MI  . Heart disease Paternal Grandmother     MI  . Diabetes Cousin     PATERNAL    ROS:  Please see the history of present illness.  All other systems reviewed and negative.   PHYSICAL EXAM: VS:  BP 132/82  Pulse 72  Ht 6' (1.829 m)  Wt 310 lb 3.2 oz (140.706 kg)  BMI 42.06 kg/m2 Morbidly obese well developed, in no acute distress HEENT: Pupils are equal round react to light accommodation extraocular movements are intact.  Neck: no JVDNo cervical lymphadenopathy. Cardiac: Regular rate and rhythm without murmurs rubs or gallops. Lungs:  clear to auscultation bilaterally, no wheezing, rhonchi or rales Abd: soft, nontender, positive bowel sounds all quadrants,  Ext: trace lower extremity edema.  2+ radial  pulses. Skin: warm and dry Neuro:  Grossly normal  EKG:  None   ASSESSMENT AND PLAN:  Problem List Items Addressed This Visit   SLEEP APNEA- on C-pap - Primary (Chronic)   Pulmonary thromboembolism- 07/16/13     On eliquis    Hypotension     BP controlled.  He is feeling better.  Ok to resume cardiac rehab. Take Lasix as needed. We discussed daily weight monitoring. He is to take  the Lasix if he gains 3 pounds in 24 hours or 5 pounds in a week.    PAF (paroxysmal atrial fibrillation) with RVR     Rate well controlled and regular.  On eliquis    Sinus bradycardia     HR in the 70's.  Now off lopressor

## 2013-12-26 NOTE — Patient Instructions (Signed)
1.  Follow up Dr. Debara Pickett in 3 Months 2.  Ok to restart cardiac rehab

## 2013-12-26 NOTE — Assessment & Plan Note (Signed)
Rate well controlled and regular.  On eliquis

## 2013-12-26 NOTE — Assessment & Plan Note (Signed)
On eliquis

## 2013-12-26 NOTE — Assessment & Plan Note (Signed)
HR in the 70's.  Now off lopressor

## 2013-12-26 NOTE — Assessment & Plan Note (Addendum)
BP controlled.  He is feeling better.  Ok to resume cardiac rehab. Take Lasix as needed. We discussed daily weight monitoring. He is to take the Lasix if he gains 3 pounds in 24 hours or 5 pounds in a week.

## 2013-12-29 ENCOUNTER — Encounter (HOSPITAL_COMMUNITY)
Admission: RE | Admit: 2013-12-29 | Discharge: 2013-12-29 | Disposition: A | Discharge status: Home / Self Care | Payer: Self-pay | Source: Ambulatory Visit | Attending: Internal Medicine | Admitting: Internal Medicine

## 2013-12-31 ENCOUNTER — Ambulatory Visit: Payer: Self-pay | Admitting: Dietician

## 2014-01-02 ENCOUNTER — Encounter (HOSPITAL_COMMUNITY)
Admission: RE | Admit: 2014-01-02 | Discharge: 2014-01-02 | Disposition: A | Discharge status: Home / Self Care | Payer: Self-pay | Source: Ambulatory Visit | Attending: Internal Medicine | Admitting: Internal Medicine

## 2014-01-02 DIAGNOSIS — I10 Essential (primary) hypertension: Secondary | ICD-10-CM | POA: Insufficient documentation

## 2014-01-02 DIAGNOSIS — I519 Heart disease, unspecified: Secondary | ICD-10-CM | POA: Insufficient documentation

## 2014-01-02 DIAGNOSIS — Z5189 Encounter for other specified aftercare: Secondary | ICD-10-CM | POA: Insufficient documentation

## 2014-01-05 ENCOUNTER — Encounter (HOSPITAL_COMMUNITY)
Admission: RE | Admit: 2014-01-05 | Discharge: 2014-01-05 | Disposition: A | Discharge status: Home / Self Care | Payer: Self-pay | Source: Ambulatory Visit | Attending: Internal Medicine | Admitting: Internal Medicine

## 2014-01-06 ENCOUNTER — Encounter: Payer: Medicare Other | Attending: General Surgery | Admitting: Dietician

## 2014-01-06 DIAGNOSIS — Z713 Dietary counseling and surveillance: Secondary | ICD-10-CM | POA: Diagnosis not present

## 2014-01-06 DIAGNOSIS — Z6841 Body Mass Index (BMI) 40.0 and over, adult: Secondary | ICD-10-CM | POA: Diagnosis not present

## 2014-01-06 NOTE — Patient Instructions (Addendum)
-  Other protein foods to try: cheese roll up with deli meat, fish, chili, boiled eggs  -If you feel lightheaded, sweaty, or weak, test your blood sugar. If it is below 70, take 15 grams of sugar (4 oz of juice or a few hard candies) -Keep a few hard candies or a juice box on hand -Always have something to eat before you workout  -Have something to eat with protein every 3-5 hours that you are awake

## 2014-01-06 NOTE — Progress Notes (Signed)
  Follow-up visit: 9 months Post-Operative LAGB Surgery  Medical Nutrition Therapy:  Appt start time: 345 end time:  420  Primary concerns today: Post-operative Bariatric Surgery Nutrition Management.  Oak returns today having gained 4.5 pounds of fluid per [place] bioelectrical impedance scale. He was recently hospitalized for hypotension and bradycardia. He reports that he feels much better and stronger since coming off his BP meds. He reports that during his hypotensive episode he had been exercising at cardiac rehab and also had an episode of hypoglycemia (BG in 50s). He reports that he had a protein shake prior to exercising that day. However, he hasn't had any symptoms of hypoglycemia since this episode. Has an appointment with surgeon Thursday for possible band fill.   Surgery date: 04/08/2013  Surgery type: LAGB  [facility] weight at [facility]: 360 lbs on 08/29/12  Weight today: 303 lbs  Weight change: 5 lbs fluid gain  Total weight lost: 62.5 lbs  Goal: 190s   [place] BODY COMP RESULTS   04/22/13  05/23/13 07/09/13 10/08/13 11/19/13 01/06/14  BMI (kg/m^2)  52.1  43.1 41.4 40.3 40.3 41.1  Fat Mass (lbs)  179.0  115.5 109 108.5 108.5 107.5  Fat Free Mass (lbs)  143.5  202.5 196.5 189 189 195.5  Total Body Water (lbs)  105.0  148 144 138.5 138.5 143     Preferred Learning Style:   No preference indicated   Learning Readiness:  Ready  24-hr recall: B (AM): Premier protein shake (30g); 1-2 cups of coffee Snk (AM): none  L (PM): burger patty or or ham or tuna or grilled chicken (14g) Snk (PM): occasionally a Premier protein bar (17g) D (PM): 4-5 oz steak with steamed vegetables (28g-35g) Snk (PM): olives and crackers, rarely a Klondike bar  Fluid intake: drinking constantly, water, decaf coffee with cream, protein shakes (at least 64 oz)  Estimated total protein intake: 80-90g per patient report  Medications: see list Supplementation: taking  Using straws: yes  Drinking while  eating: occasionally; if food gets "stuck" Hair loss: none Carbonated beverages: none N/V/D/C: none Last Lap-Band fill: 2cc on 10/02/2013 (first fill); thinks he may have gotten a more recent fill   Recent physical activity:  Cardiac rehab  Progress Towards Goal(s):  In progress.    Nutritional Diagnosis:  [place]-3.3 Overweight/obesity related to past poor dietary habits and physical inactivity as evidenced by patient w/ recent LAGB surgery following dietary guidelines for continued weight loss.    Intervention:  Nutrition education provided. Educated patient on symptoms of hypoglycemia and proper treatment. Encouraged him to check his blood sugar if he feels symptomatic and keep hard candy or juice on hand to treat hypoglycemia. Recommended that he eat something with protein every 3-5 hours that he is awake and always eat prior to exercise.   Teaching Method Utilized:  Visual Auditory  Barriers to learning/adherence to lifestyle change: none  Demonstrated degree of understanding via:  Teach Back   Monitoring/Evaluation:  Dietary intake, exercise, lap band fills, and body weight. Follow up in 2 months for 11 month post-op visit.

## 2014-01-07 ENCOUNTER — Encounter (HOSPITAL_COMMUNITY)
Admission: RE | Admit: 2014-01-07 | Discharge: 2014-01-07 | Disposition: A | Discharge status: Home / Self Care | Payer: Self-pay | Source: Ambulatory Visit | Attending: Internal Medicine | Admitting: Internal Medicine

## 2014-01-08 DIAGNOSIS — Z9884 Bariatric surgery status: Secondary | ICD-10-CM | POA: Diagnosis not present

## 2014-01-08 DIAGNOSIS — Z6841 Body Mass Index (BMI) 40.0 and over, adult: Secondary | ICD-10-CM | POA: Diagnosis not present

## 2014-01-09 ENCOUNTER — Encounter (HOSPITAL_COMMUNITY)
Admission: RE | Admit: 2014-01-09 | Discharge: 2014-01-09 | Disposition: A | Discharge status: Home / Self Care | Payer: Self-pay | Source: Ambulatory Visit | Attending: Internal Medicine | Admitting: Internal Medicine

## 2014-01-12 ENCOUNTER — Encounter (HOSPITAL_COMMUNITY): Payer: Self-pay

## 2014-01-14 ENCOUNTER — Encounter (HOSPITAL_COMMUNITY)
Admission: RE | Admit: 2014-01-14 | Discharge: 2014-01-14 | Disposition: A | Discharge status: Home / Self Care | Payer: Self-pay | Source: Ambulatory Visit | Attending: Internal Medicine | Admitting: Internal Medicine

## 2014-01-16 ENCOUNTER — Encounter (HOSPITAL_COMMUNITY)
Admission: RE | Admit: 2014-01-16 | Discharge: 2014-01-16 | Disposition: A | Discharge status: Home / Self Care | Payer: Self-pay | Source: Ambulatory Visit | Attending: Internal Medicine | Admitting: Internal Medicine

## 2014-01-19 ENCOUNTER — Encounter (HOSPITAL_COMMUNITY)
Admission: RE | Admit: 2014-01-19 | Discharge: 2014-01-19 | Disposition: A | Discharge status: Home / Self Care | Payer: Self-pay | Source: Ambulatory Visit | Attending: Internal Medicine | Admitting: Internal Medicine

## 2014-01-21 ENCOUNTER — Encounter (HOSPITAL_COMMUNITY)
Admission: RE | Admit: 2014-01-21 | Discharge: 2014-01-21 | Disposition: A | Discharge status: Home / Self Care | Payer: Self-pay | Source: Ambulatory Visit | Attending: Internal Medicine | Admitting: Internal Medicine

## 2014-01-23 ENCOUNTER — Telehealth (HOSPITAL_COMMUNITY): Payer: Self-pay

## 2014-01-23 ENCOUNTER — Encounter (HOSPITAL_COMMUNITY): Payer: Self-pay

## 2014-01-23 NOTE — Telephone Encounter (Signed)
Pt states he is "behaving badly." Has bad moods, depression but not suicidal, and anxiety. Feels angry and is not sure why. Does have a lot of stress with his mother being in a nursing home, taking care of her and also trying to sell her home. He and his wife have cardiac issues and has multiple doctor visits. Has no time for himself. Wife noticed change in his mood/behavior 1 week ago. Wondering if his weight loss of 100 lbs has affected the way the medication has been working. Would like Dr. Adele Schilder to call him to discuss what he needs to do. Did make an appointment for Monday to talk with him.

## 2014-01-26 ENCOUNTER — Encounter (HOSPITAL_COMMUNITY): Payer: Self-pay

## 2014-01-26 ENCOUNTER — Encounter (HOSPITAL_COMMUNITY): Payer: Self-pay | Admitting: Psychiatry

## 2014-01-26 ENCOUNTER — Ambulatory Visit (INDEPENDENT_AMBULATORY_CARE_PROVIDER_SITE_OTHER): Payer: Medicare Other | Admitting: Psychiatry

## 2014-01-26 VITALS — BP 144/86 | HR 61 | Ht 72.0 in | Wt 307.6 lb

## 2014-01-26 DIAGNOSIS — F339 Major depressive disorder, recurrent, unspecified: Secondary | ICD-10-CM | POA: Diagnosis not present

## 2014-01-26 DIAGNOSIS — F331 Major depressive disorder, recurrent, moderate: Secondary | ICD-10-CM

## 2014-01-26 NOTE — Progress Notes (Signed)
[facility] Progress Note  [name] [id number] 68 y.o.  01/26/2014 4:11 PM  Chief Complaint:  I am under distress .  I'm not sure if my medicine needs to be changed .          History of Present Illness:  [name] came earlier than his is scheduled appointment.  He is complaining of increased stress in recent months.  He has a lot of things going on in his life.  He was admitted to the hospital because of weakness and found to be hypotensive.  After that his wife decided to have cardiac issues and recently his brother started to have health issues and scheduled to see physician tomorrow because he has bloody stools.  Patient's mother is in nursing home.  Patient admitted lately he is "overwhelmed by multiple doctors visit, family member getting sick and he feels guilt because he is unable to help them.  Due to his own health issues him time he feels very weak tired and he has no energy.  He denies any crying spells, irritability, anger but admitted some time frustration.  He admitted racing thoughts and the night but denies any paranoia or any hallucination.  Today he came earlier because he wants to make sure that if anything need to be done because he does not want to get severely depressed.  He is not interested to change the medication unless it is important.  He is taking Risperdal , Zoloft and Ativan.  He has no side effects.  Patient has surgery in January for weight loss and then after he had heart surgery.  He was diagnosed with pericarditis, A. fib .  He is trying to go for cardiac rehabilitation 3 times a day and now he and his wife goes together.  He is shocked that his life is almost changed from last year.  He admitted some time difficulty to handle his stress level denies any agitation, anger, mood swings.  He admitted fear about his chronic health issues.  He denies any psychosis or any hallucination.  His appetite is okay.  His vitals are stable.  He has no  tremors or shakes.  He is not seeing any therapist at this time.  He does not drink or use any illegal substances.  Suicidal Ideation: No Plan Formed: No Patient has means to carry out plan: No  Homicidal Ideation: No Plan Formed: No Patient has means to carry out plan: No  Review of Systems: Psychiatric: Agitation: No Hallucination: No Depressed Mood: Yes Insomnia: No Hypersomnia: No Altered Concentration: No Feels Worthless: No Grandiose Ideas: No Belief In Special Powers: No New/Increased Substance Abuse: No Compulsions: No  Neurologic: Headache: No Seizure: No Paresthesias: No  Past Medical History  Diagnosis Date  . Benign prostatic hypertrophy     several prostate biopsies neg for cancer.   . Hypertension   . Gout   . Depression   . FH: colonic polyps 2010  . Morbid obesity   . Complication of anesthesia     MORPHINE CAUSES HALLUCINATIONS  . Asthma     childhood asthma  . Numbness     feet / legs  . History of kidney stones   . Kidney cysts   . Sleep apnea     USES C PAP  . Bilateral hip pain   . PAF (paroxysmal atrial fibrillation) with RVR 07/29/2013  . Anemia 07/29/2013  Patient see Dr. [name].  Psychosocial History: Patient lives with his wife.  His wife is very supportive.  Outpatient Encounter Prescriptions as of 01/26/2014  Medication Sig  . apixaban (ELIQUIS) 5 MG TABS tablet Take 5 mg by mouth 2 (two) times daily.  [name] CALCIUM PO Take 500 mg by mouth 3 (three) times daily.   . Cyanocobalamin (VITAMIN B-12) 1000 MCG SUBL Place 1,000 mcg under the tongue every morning.  [name] Sodium (DSS) 100 MG CAPS Take 100 mg by mouth every other day.  . finasteride (PROSCAR) 5 MG tablet Take 5 mg by mouth daily.   . flecainide (TAMBOCOR) 50 MG tablet Take 1 tablet (50 mg total) by mouth every 12 (twelve) hours.  . fluticasone (FLONASE) 50 MCG/ACT nasal spray Place 2 sprays into both nostrils daily.  . furosemide (LASIX) 20 MG tablet Take 1 tablet (20 mg  total) by mouth daily as needed (as needed for swelling in the legs).  . loratadine (CLARITIN) 10 MG tablet Take 1 tablet (10 mg total) by mouth daily.  [name] LORazepam (ATIVAN) 0.5 MG tablet Take 0.5 mg by mouth 3 (three) times daily. Take 0.5 in the AM and 2.$Remove'5mg'[id number]$  in the PM  . montelukast (SINGULAIR) 10 MG tablet Take 1 tablet (10 mg total) by mouth at bedtime.  . Multiple Vitamin (MULTIVITAMIN) tablet Take 1 tablet by mouth daily. Chewable tablet with iron  . pantoprazole (PROTONIX) 40 MG tablet Take 40 mg by mouth 2 (two) times daily.  . potassium chloride (K-DUR,KLOR-CON) 10 MEQ tablet Take 10 mEq by mouth 3 (three) times daily.  . risperiDONE (RISPERDAL) 0.5 MG tablet Take 1 tablet (0.5 mg total) by mouth at bedtime.  . sertraline (ZOLOFT) 100 MG tablet Take 1.5 tablets (150 mg total) by mouth daily with breakfast.  . Tetrahydrozoline HCl (VISINE OP) Place 2 drops into both eyes daily as needed (dry eyes).    Recent Results (from the past 2160 hour(s))  CBC     Status: Abnormal   Collection Time    12/15/13  1:46 PM      Result Value Ref Range   WBC 8.7  4.0 - 10.5 K/uL   RBC 5.48  4.22 - 5.81 MIL/uL   Hemoglobin 14.4  13.0 - 17.0 g/dL   HCT 43.6  39.0 - 52.0 %   MCV 79.6  78.0 - 100.0 fL   MCH 26.3  26.0 - 34.0 pg   MCHC 33.0  30.0 - 36.0 g/dL   RDW 17.8 (*) 11.5 - 15.5 %   Platelets 214  150 - 400 K/uL  BASIC METABOLIC PANEL     Status: Abnormal   Collection Time    12/15/13  1:46 PM      Result Value Ref Range   Sodium 140  137 - 147 mEq/L   Potassium 4.5  3.7 - 5.3 mEq/L   Chloride 99  96 - 112 mEq/L   CO2 29  19 - 32 mEq/L   Glucose, Bld 56 (*) 70 - 99 mg/dL   BUN 29 (*) 6 - 23 mg/dL   Creatinine, Ser 1.38 (*) 0.50 - 1.35 mg/dL   Calcium 9.2  8.4 - 10.5 mg/dL   GFR calc non Af Amer 51 (*) >90 mL/min   GFR calc Af Amer 60 (*) >90 mL/min   Comment: (NOTE)     The eGFR has been calculated using the CKD EPI equation.     This calculation has not been validated in all  clinical situations.     eGFR's persistently <90 mL/min signify possible Chronic  Kidney     Disease.   Anion gap 12  5 - 15  I-STAT TROPOININ, ED     Status: None   Collection Time    12/15/13  2:20 PM      Result Value Ref Range   Troponin i, poc 0.00  0.00 - 0.08 ng/mL   Comment 3            Comment: Due to the release kinetics of cTnI,     a negative result within the first hours     of the onset of symptoms does not rule out     myocardial infarction with certainty.     If myocardial infarction is still suspected,     repeat the test at appropriate intervals.  CBG MONITORING, ED     Status: None   Collection Time    12/15/13  5:22 PM      Result Value Ref Range   Glucose-Capillary 81  70 - 99 mg/dL  I-STAT TROPOININ, ED     Status: None   Collection Time    12/15/13  6:03 PM      Result Value Ref Range   Troponin i, poc 0.00  0.00 - 0.08 ng/mL   Comment 3            Comment: Due to the release kinetics of cTnI,     a negative result within the first hours     of the onset of symptoms does not rule out     myocardial infarction with certainty.     If myocardial infarction is still suspected,     repeat the test at appropriate intervals.  TSH     Status: None   Collection Time    12/15/13  8:02 PM      Result Value Ref Range   TSH 3.700  0.350 - 4.500 uIU/mL  CBG MONITORING, ED     Status: None   Collection Time    12/15/13  8:32 PM      Result Value Ref Range   Glucose-Capillary 93  70 - 99 mg/dL  MRSA PCR SCREENING     Status: None   Collection Time    12/15/13  9:18 PM      Result Value Ref Range   MRSA by PCR NEGATIVE  NEGATIVE   Comment:            The GeneXpert MRSA Assay (FDA     approved for NASAL specimens     only), is one component of a     comprehensive MRSA colonization     surveillance program. It is not     intended to diagnose MRSA     infection nor to guide or     monitor treatment for     MRSA infections.  TROPONIN I     Status: None    Collection Time    12/15/13 11:37 PM      Result Value Ref Range   Troponin I <0.30  <0.30 ng/mL   Comment:            Due to the release kinetics of cTnI,     a negative result within the first hours     of the onset of symptoms does not rule out     myocardial infarction with certainty.     If myocardial infarction is still suspected,     repeat the test at appropriate intervals.  PRO B NATRIURETIC PEPTIDE  Status: Abnormal   Collection Time    12/15/13 11:37 PM      Result Value Ref Range   Pro B Natriuretic peptide (BNP) 131.0 (*) 0 - 125 pg/mL  COMPREHENSIVE METABOLIC PANEL     Status: Abnormal   Collection Time    12/16/13  2:15 AM      Result Value Ref Range   Sodium 142  137 - 147 mEq/L   Potassium 3.7  3.7 - 5.3 mEq/L   Comment: DELTA CHECK NOTED     NO VISIBLE HEMOLYSIS   Chloride 103  96 - 112 mEq/L   CO2 26  19 - 32 mEq/L   Glucose, Bld 96  70 - 99 mg/dL   BUN 28 (*) 6 - 23 mg/dL   Creatinine, Ser 1.25  0.50 - 1.35 mg/dL   Calcium 8.4  8.4 - 10.5 mg/dL   Total Protein 6.2  6.0 - 8.3 g/dL   Albumin 3.0 (*) 3.5 - 5.2 g/dL   AST 19  0 - 37 U/L   ALT 13  0 - 53 U/L   Alkaline Phosphatase 69  39 - 117 U/L   Total Bilirubin 0.7  0.3 - 1.2 mg/dL   GFR calc non Af Amer 58 (*) >90 mL/min   GFR calc Af Amer 67 (*) >90 mL/min   Comment: (NOTE)     The eGFR has been calculated using the CKD EPI equation.     This calculation has not been validated in all clinical situations.     eGFR's persistently <90 mL/min signify possible Chronic Kidney     Disease.   Anion gap 13  5 - 15  CBC WITH DIFFERENTIAL     Status: Abnormal   Collection Time    12/16/13  2:15 AM      Result Value Ref Range   WBC 8.4  4.0 - 10.5 K/uL   RBC 5.06  4.22 - 5.81 MIL/uL   Hemoglobin 13.5  13.0 - 17.0 g/dL   HCT 40.1  39.0 - 52.0 %   MCV 79.2  78.0 - 100.0 fL   MCH 26.7  26.0 - 34.0 pg   MCHC 33.7  30.0 - 36.0 g/dL   RDW 18.0 (*) 11.5 - 15.5 %   Platelets 169  150 - 400 K/uL    Neutrophils Relative % 60  43 - 77 %   Neutro Abs 5.1  1.7 - 7.7 K/uL   Lymphocytes Relative 26  12 - 46 %   Lymphs Abs 2.2  0.7 - 4.0 K/uL   Monocytes Relative 11  3 - 12 %   Monocytes Absolute 0.9  0.1 - 1.0 K/uL   Eosinophils Relative 2  0 - 5 %   Eosinophils Absolute 0.2  0.0 - 0.7 K/uL   Basophils Relative 1  0 - 1 %   Basophils Absolute 0.1  0.0 - 0.1 K/uL  TROPONIN I     Status: None   Collection Time    12/16/13  2:15 AM      Result Value Ref Range   Troponin I <0.30  <0.30 ng/mL   Comment:            Due to the release kinetics of cTnI,     a negative result within the first hours     of the onset of symptoms does not rule out     myocardial infarction with certainty.     If myocardial infarction is still suspected,  repeat the test at appropriate intervals.  TROPONIN I     Status: None   Collection Time    12/16/13  8:19 AM      Result Value Ref Range   Troponin I <0.30  <0.30 ng/mL   Comment:            Due to the release kinetics of cTnI,     a negative result within the first hours     of the onset of symptoms does not rule out     myocardial infarction with certainty.     If myocardial infarction is still suspected,     repeat the test at appropriate intervals.  GLUCOSE, CAPILLARY     Status: Abnormal   Collection Time    12/16/13  8:33 AM      Result Value Ref Range   Glucose-Capillary 119 (*) 70 - 99 mg/dL    Past Psychiatric History/Hospitalization(s) Patient has one psychiatric admission in 2006 due to increased depression and having suicidal thoughts.  Since then patient has been seen in this office and feeling stable on his psychiatric medication. Anxiety: Yes Bipolar Disorder: No Depression: Yes Mania: No Psychosis: No Schizophrenia: No Personality Disorder: No Hospitalization for psychiatric illness: Yes History of Electroconvulsive Shock Therapy: No Prior Suicide Attempts: No  Physical Exam: Constitutional:  BP 144/86  Pulse 61  Ht  6' (1.829 m)  Wt 307 lb 9.6 oz (139.526 kg)  BMI 41.71 kg/m2  Musculoskeletal: Strength & Muscle Tone: within normal limits Gait & Station: normal Patient leans: N/A  Mental Status Examination;  Patient is casually dressed and fairly groomed.  He is tired but maintain good eye contact. He is pleasant and cooperative.  His thought process is logical linear and goal directed.  There were no flight if ideas of any loose association.  He denies any active or passive suicidal thoughts and homicidal thoughts.  His fund of knowledge is adequate.  There were no paranoia or delusions present at this time.  His attention concentration is fair.  He is alert and oriented x3.  He described his anxious and his affect is mood appropriate.  His insight judgment and impulse control is okay.   Established Problem, Stable/Improving (1), New problem, with additional work up planned, Review of Psycho-Social Stressors (1), Review or order clinical lab tests (1), Review of Last Therapy Session (1) and Review of Medication Regimen & Side Effects (2)  Assessment: Axis I: Maj. depressive disorder, recurrent  Axis II: Deferred  Axis III:  Past Medical History  Diagnosis Date  . Benign prostatic hypertrophy     several prostate biopsies neg for cancer.   . Hypertension   . Gout   . Depression   . FH: colonic polyps 2010  . Morbid obesity   . Complication of anesthesia     MORPHINE CAUSES HALLUCINATIONS  . Asthma     childhood asthma  . Numbness     feet / legs  . History of kidney stones   . Kidney cysts   . Sleep apnea     USES C PAP  . Bilateral hip pain   . PAF (paroxysmal atrial fibrillation) with RVR 07/29/2013  . Anemia 07/29/2013    Axis IV: Mild   Plan:  I do believe patient has been more anxious and nervous from the past.  In the past few months he has lot of health issues, family member being sick and he feels guilty that he is unable to help  them.  He is taking his medication.  He is  afraid to take more medication and he is very reluctant to change the dosage.  I have talked with him multiple times to see a counselor but patient refused.  After some encouragement he agreed to see a therapist if his condition does not improve in 4 weeks.  He wants to come back in a month without any changes in his medication.  I recommended to call us back if he ever decided to change his mind.  At this time I will continue Zoloft 150 mg daily, Risperdal, Risperdal 0.5 mg at bedtime and Ativan 0.5 mg in the morning and 2 tablets at bedtime.  Discuss risks and benefits of the medication.   Follow-up in 4 weeks.  Time spent 25 minutes.  More than 50% of the time spent in psychoeducation, counseling and coordination of care.  Discuss safety plan that anytime having active suicidal thoughts or homicidal thoughts then patient need to call 911 or go to the local emergency room.  Ferron Ishmael T., MD 01/26/2014

## 2014-01-28 ENCOUNTER — Encounter (HOSPITAL_COMMUNITY): Payer: Self-pay

## 2014-01-30 ENCOUNTER — Encounter (HOSPITAL_COMMUNITY): Payer: Self-pay

## 2014-02-02 ENCOUNTER — Encounter (HOSPITAL_COMMUNITY): Payer: Self-pay | Attending: Internal Medicine

## 2014-02-02 DIAGNOSIS — Z5189 Encounter for other specified aftercare: Secondary | ICD-10-CM | POA: Insufficient documentation

## 2014-02-02 DIAGNOSIS — I519 Heart disease, unspecified: Secondary | ICD-10-CM | POA: Insufficient documentation

## 2014-02-02 DIAGNOSIS — I10 Essential (primary) hypertension: Secondary | ICD-10-CM | POA: Insufficient documentation

## 2014-02-04 ENCOUNTER — Encounter (HOSPITAL_COMMUNITY): Payer: Medicare Other

## 2014-02-06 ENCOUNTER — Encounter (HOSPITAL_COMMUNITY): Payer: Self-pay

## 2014-02-09 ENCOUNTER — Encounter (HOSPITAL_COMMUNITY): Payer: Self-pay

## 2014-02-11 ENCOUNTER — Encounter (HOSPITAL_COMMUNITY): Payer: Self-pay

## 2014-02-13 ENCOUNTER — Encounter (HOSPITAL_COMMUNITY): Payer: Self-pay

## 2014-02-16 ENCOUNTER — Encounter (HOSPITAL_COMMUNITY): Payer: Self-pay

## 2014-02-18 ENCOUNTER — Encounter (HOSPITAL_COMMUNITY): Payer: Self-pay

## 2014-02-20 ENCOUNTER — Encounter (HOSPITAL_COMMUNITY): Payer: Self-pay

## 2014-02-23 ENCOUNTER — Encounter (HOSPITAL_COMMUNITY): Payer: Self-pay

## 2014-02-23 ENCOUNTER — Other Ambulatory Visit: Payer: Self-pay | Admitting: Internal Medicine

## 2014-02-24 ENCOUNTER — Encounter (HOSPITAL_COMMUNITY): Payer: Self-pay | Admitting: Psychiatry

## 2014-02-24 ENCOUNTER — Ambulatory Visit (INDEPENDENT_AMBULATORY_CARE_PROVIDER_SITE_OTHER): Payer: Medicare Other | Admitting: Psychiatry

## 2014-02-24 VITALS — BP 145/94 | HR 62 | Ht 72.0 in | Wt 307.4 lb

## 2014-02-24 DIAGNOSIS — F339 Major depressive disorder, recurrent, unspecified: Secondary | ICD-10-CM | POA: Diagnosis not present

## 2014-02-24 DIAGNOSIS — F330 Major depressive disorder, recurrent, mild: Secondary | ICD-10-CM

## 2014-02-24 MED ORDER — RISPERIDONE 0.5 MG PO TABS: 0.5000 mg | ORAL_TABLET | Freq: Every day | ORAL | Status: DC

## 2014-02-24 MED ORDER — LORAZEPAM 0.5 MG PO TABS: ORAL_TABLET | ORAL | Status: DC

## 2014-02-24 MED ORDER — SERTRALINE HCL 100 MG PO TABS: 150.0000 mg | ORAL_TABLET | Freq: Every day | ORAL | Status: DC

## 2014-02-24 NOTE — Progress Notes (Signed)
[facility] Progress Note  [name] [id number] 68 y.o.  02/24/2014 1:57 PM  Chief Complaint:  I am doing better.            History of Present Illness:  [name] came for his appointment.  He is doing much better .  He is less anxious and less depressed.  He does not feel that he need to see a therapist.  He is getting help from his brother in taking care of the mother.  His wife also going by herself for cardiac rehabilitation.  He feels more relaxed and calm.  He sleeping better.  He denies any panic attack.  He feel his current medicine is working.  He denies any crying spells, irritability, anger or any mood swings.  He was feeling overwhelmed on his last visit and he was concerned about his physical health.  However he feels things are going very well.  He is looking forward to have a Thanksgiving meal for the family.  He mentioned that he is going to help his wife in cooking.  His energy level is good.  He denies any feeling of hopelessness or worthlessness.  He denies any side effects of medication.  Patient denies drinking or using any illegal substances.  His appetite is okay.  His vitals are stable.  He is taking Risperdal, Zoloft and Ativan as prescribed.  Patient lives with his wife.  His mother is in 81s and she lives in nursing home.  Suicidal Ideation: No Plan Formed: No Patient has means to carry out plan: No  Homicidal Ideation: No Plan Formed: No Patient has means to carry out plan: No  Review of Systems: Psychiatric: Agitation: No Hallucination: No Depressed Mood: No Insomnia: No Hypersomnia: No Altered Concentration: No Feels Worthless: No Grandiose Ideas: No Belief In Special Powers: No New/Increased Substance Abuse: No Compulsions: No  Neurologic: Headache: No Seizure: No Paresthesias: No  Past Medical History  Diagnosis Date  . Benign prostatic hypertrophy     several prostate biopsies neg for cancer.   . Hypertension   .  Gout   . Depression   . FH: colonic polyps 2010  . Morbid obesity   . Complication of anesthesia     MORPHINE CAUSES HALLUCINATIONS  . Asthma     childhood asthma  . Numbness     feet / legs  . History of kidney stones   . Kidney cysts   . Sleep apnea     USES C PAP  . Bilateral hip pain   . PAF (paroxysmal atrial fibrillation) with RVR 07/29/2013  . Anemia 07/29/2013  Patient see Dr. [name].  Psychosocial History: Patient lives with his wife.  His wife is very supportive.  Outpatient Encounter Prescriptions as of 02/24/2014  Medication Sig  . apixaban (ELIQUIS) 5 MG TABS tablet Take 5 mg by mouth 2 (two) times daily.  [name] CALCIUM PO Take 500 mg by mouth 3 (three) times daily.   . Cyanocobalamin (VITAMIN B-12) 1000 MCG SUBL Place 1,000 mcg under the tongue every morning.  [name] Sodium (DSS) 100 MG CAPS Take 100 mg by mouth every other day.  [name] ELIQUIS 5 MG TABS tablet TAKE ONE TABLET BY MOUTH TWICE DAILY  . EQ ALLERGY RELIEF 10 MG tablet TAKE ONE TABLET BY MOUTH ONCE DAILY  . finasteride (PROSCAR) 5 MG tablet Take 5 mg by mouth daily.   . flecainide (TAMBOCOR) 50 MG tablet TAKE ONE TABLET BY MOUTH EVERY 12 HOURS  .  fluticasone (FLONASE) 50 MCG/ACT nasal spray Place 2 sprays into both nostrils daily.  . furosemide (LASIX) 20 MG tablet Take 1 tablet (20 mg total) by mouth daily as needed (as needed for swelling in the legs).  . LORazepam (ATIVAN) 0.5 MG tablet Take 1 tab in the AM and 2 at bed time.  . montelukast (SINGULAIR) 10 MG tablet Take 1 tablet (10 mg total) by mouth at bedtime.  . Multiple Vitamin (MULTIVITAMIN) tablet Take 1 tablet by mouth daily. Chewable tablet with iron  . pantoprazole (PROTONIX) 40 MG tablet Take 40 mg by mouth 2 (two) times daily.  . potassium chloride (K-DUR,KLOR-CON) 10 MEQ tablet Take 10 mEq by mouth 3 (three) times daily.  . risperiDONE (RISPERDAL) 0.5 MG tablet Take 1 tablet (0.5 mg total) by mouth at bedtime.  . sertraline (ZOLOFT) 100 MG  tablet Take 1.5 tablets (150 mg total) by mouth daily with breakfast.  . Tetrahydrozoline HCl (VISINE OP) Place 2 drops into both eyes daily as needed (dry eyes).  . [DISCONTINUED] LORazepam (ATIVAN) 0.5 MG tablet Take 0.5 mg by mouth 3 (three) times daily. Take 0.5 in the AM and 2.5mg  in the PM  . [DISCONTINUED] risperiDONE (RISPERDAL) 0.5 MG tablet Take 1 tablet (0.5 mg total) by mouth at bedtime.  . [DISCONTINUED] sertraline (ZOLOFT) 100 MG tablet Take 1.5 tablets (150 mg total) by mouth daily with breakfast.    Past Psychiatric History/Hospitalization(s) Patient has one psychiatric admission in 2006 due to increased depression and having suicidal thoughts.  Since then patient has been seen in this office and feeling stable on his psychiatric medication. Anxiety: Yes Bipolar Disorder: No Depression: Yes Mania: No Psychosis: No Schizophrenia: No Personality Disorder: No Hospitalization for psychiatric illness: Yes History of Electroconvulsive Shock Therapy: No Prior Suicide Attempts: No  Physical Exam: Constitutional:  BP 145/94 mmHg  Pulse 62  Ht 6' (1.829 m)  Wt 307 lb 6.4 oz (139.436 kg)  BMI 41.68 kg/m2  Musculoskeletal: Strength & Muscle Tone: within normal limits Gait & Station: normal Patient leans: N/A  Mental Status Examination;  Patient is casually dressed and fairly groomed.  He is pleasant and cooperative.  He maintained good eye contact.  His his speech is soft, clear and coherent.  He described his mood good and his affect is improved from the past.  His thought process is logical linear and goal directed.  There were no flight if ideas of any loose association.  He denies any active or passive suicidal thoughts and homicidal thoughts.  His fund of knowledge is adequate.  There were no paranoia or delusions present at this time.  His attention concentration is fair.  He is alert and oriented x3.  His insight judgment and impulse control is okay.   Established  Problem, Stable/Improving (1), Review of Psycho-Social Stressors (1), Review of Last Therapy Session (1) and Review of Medication Regimen & Side Effects (2)  Assessment: Axis I: Maj. depressive disorder, recurrent  Axis II: Deferred  Axis III:  Past Medical History  Diagnosis Date  . Benign prostatic hypertrophy     several prostate biopsies neg for cancer.   . Hypertension   . Gout   . Depression   . FH: colonic polyps 2010  . Morbid obesity   . Complication of anesthesia     MORPHINE CAUSES HALLUCINATIONS  . Asthma     childhood asthma  . Numbness     feet / legs  . History of kidney stones   .  Kidney cysts   . Sleep apnea     USES C PAP  . Bilateral hip pain   . PAF (paroxysmal atrial fibrillation) with RVR 07/29/2013  . Anemia 07/29/2013    Axis IV: Mild   Plan:  Patient is doing better on his medication.  He does not believe he needs any counseling at this time.  He has no side effects of medication.  I will continue Zoloft 150 mg daily, Risperdal 0.5 mg at bedtime and Ativan 0.5 mg in the morning and 1 mg at bedtime.  Recommended to call us back if he has any question or any concern if he feel worsening of the symptoms.  I will see him again in 3 months.  ARFEEN,SYED T., MD 02/24/2014

## 2014-02-25 ENCOUNTER — Encounter (HOSPITAL_COMMUNITY): Payer: Self-pay

## 2014-03-02 ENCOUNTER — Other Ambulatory Visit: Payer: Self-pay | Admitting: Internal Medicine

## 2014-03-02 ENCOUNTER — Encounter (HOSPITAL_COMMUNITY): Payer: Self-pay

## 2014-03-03 ENCOUNTER — Telehealth: Payer: Self-pay | Admitting: *Deleted

## 2014-03-03 NOTE — Telephone Encounter (Signed)
Faxed OK for patient to continue exercise on his own at this time. Signed by Dr. Debara Pickett

## 2014-03-04 ENCOUNTER — Encounter (HOSPITAL_COMMUNITY): Payer: Self-pay

## 2014-03-05 ENCOUNTER — Ambulatory Visit (HOSPITAL_COMMUNITY): Payer: Self-pay | Admitting: Psychiatry

## 2014-03-06 ENCOUNTER — Encounter (HOSPITAL_COMMUNITY): Payer: Self-pay

## 2014-03-09 ENCOUNTER — Encounter (HOSPITAL_COMMUNITY): Payer: Self-pay

## 2014-03-10 ENCOUNTER — Encounter: Payer: Medicare Other | Attending: General Surgery | Admitting: Dietician

## 2014-03-10 DIAGNOSIS — Z6841 Body Mass Index (BMI) 40.0 and over, adult: Secondary | ICD-10-CM | POA: Insufficient documentation

## 2014-03-10 DIAGNOSIS — Z713 Dietary counseling and surveillance: Secondary | ICD-10-CM | POA: Insufficient documentation

## 2014-03-10 NOTE — Progress Notes (Signed)
  Follow-up visit: 11 months Post-Operative LAGB Surgery  Medical Nutrition Therapy:  Appt start time: 415 end time:  440  Primary concerns today: Post-operative Bariatric Surgery Nutrition Management.  [name] returns today having gained 4.5 lbs. He reports that his goal is to maintain weight through the holidays. He feels that he is not eating enough to have energy. However, he states his blood sugars are fine and remains off of his BP medicines. Had a band fill recently and feeling some restriction; vomited a few times after eating too much. Feels like he wants to have fluid removed from his band "to get through the holidays."  Surgery date: 04/08/2013  Surgery type: LAGB  [facility] weight at [facility]: 360 lbs on 08/29/12  Weight today: 307.5 lbs Weight change: 4.5 lbs gain  Total weight lost: 52.5 lbs  Goal: 190s   TANITA BODY COMP RESULTS   04/22/13  05/23/13 07/09/13 10/08/13 11/19/13 01/06/14 03/10/14  BMI (kg/m^2)  52.1  43.1 41.4 40.3 40.3 41.1 41.7  Fat Mass (lbs)  179.0  115.5 109 108.5 108.5 107.5 110  Fat Free Mass (lbs)  143.5  202.5 196.5 189 189 195.5 197.5  Total Body Water (lbs)  105.0  148 144 138.5 138.5 143 144.5     Preferred Learning Style:   No preference indicated   Learning Readiness:  Ready  24-hr recall: B (AM): Premier protein shake (30g); 1-2 cups of coffee Snk (AM): none  L (2 PM): double cheeseburger from [employer] OR [employer] chicken or country style steak with vegetables Snk (PM):  D (PM): tuna or BBQ plate with 1-2 hushpuppies and slaw Snk (PM): cheese or cashews or pecans or Klondike bar occasionally  Fluid intake: drinking constantly, water, decaf coffee with cream, protein shakes (at least 64 oz)  Estimated total protein intake: 80-90g per patient report  Medications: see list Supplementation: taking  Using straws: yes  Drinking while eating: occasionally sips if food gets "stuck" Hair loss: none Carbonated beverages: has had 1 ginger ale N/V/D/C:  vomiting 2-3x since last fill Last Lap-Band fill: has had a recent fill (not sure when or how much)  Recent physical activity:  House renovations  Progress Towards Goal(s):  In progress.    Nutritional Diagnosis:  Braden-3.3 Overweight/obesity related to past poor dietary habits and physical inactivity as evidenced by patient w/ recent LAGB surgery following dietary guidelines for continued weight loss.    Intervention:  Nutrition education provided. Goals: -Try having a solid, high-protein breakfast: cheese roll up with deli meat, fish, chili, boiled eggs -Avoid eating hamburger buns -Pack a cooler with high-protein snacks -Have something to eat with protein every 3-5 hours that you are awake  Teaching Method Utilized:  Visual Auditory  Barriers to learning/adherence to lifestyle change: none  Demonstrated degree of understanding via:  Teach Back   Monitoring/Evaluation:  Dietary intake, exercise, lap band fills, and body weight. Follow up in 2 months for 13 month post-op visit.

## 2014-03-10 NOTE — Patient Instructions (Addendum)
-  Try having a solid, high-protein breakfast: cheese roll up with deli meat, fish, chili, boiled eggs -Avoid eating hamburger buns -Pack a cooler with high-protein snacks  -Have something to eat with protein every 3-5 hours that you are awake

## 2014-03-11 ENCOUNTER — Encounter (HOSPITAL_COMMUNITY): Payer: Self-pay

## 2014-03-12 ENCOUNTER — Ambulatory Visit (INDEPENDENT_AMBULATORY_CARE_PROVIDER_SITE_OTHER): Payer: Medicare Other | Admitting: Internal Medicine

## 2014-03-12 ENCOUNTER — Encounter: Payer: Self-pay | Admitting: Internal Medicine

## 2014-03-12 VITALS — BP 142/88 | HR 65 | Ht 72.0 in | Wt 311.1 lb

## 2014-03-12 DIAGNOSIS — I313 Pericardial effusion (noninflammatory): Secondary | ICD-10-CM

## 2014-03-12 DIAGNOSIS — G4730 Sleep apnea, unspecified: Secondary | ICD-10-CM

## 2014-03-12 DIAGNOSIS — I480 Paroxysmal atrial fibrillation: Secondary | ICD-10-CM | POA: Diagnosis not present

## 2014-03-12 DIAGNOSIS — I319 Disease of pericardium, unspecified: Secondary | ICD-10-CM

## 2014-03-12 DIAGNOSIS — I3139 Other pericardial effusion (noninflammatory): Secondary | ICD-10-CM

## 2014-03-12 DIAGNOSIS — I2699 Other pulmonary embolism without acute cor pulmonale: Secondary | ICD-10-CM

## 2014-03-12 NOTE — Patient Instructions (Signed)
For samples you can also call our [facility] - [phone] [address])  Your physician wants you to follow-up in: 1 year with Dr. Debara Pickett. You will receive a reminder letter in the mail two months in advance. If you don't receive a letter, please call our office to schedule the follow-up appointment.

## 2014-03-12 NOTE — Progress Notes (Signed)
OFFICE NOTE  Chief Complaint:  Routine follow-up  Primary Care Physician: [name], MD  HPI:  RHYTHM [name] is a 68 y/o male with a history of bariatric surgery, one year ago, lost about 85 pounds before and after surgery. Also has a history of hypertension and sleep apnea. He has been admitted twice in the last month with an unusual presentation with multiple medical problems, including pulmonary embolus, moderate to large pericardial effusion, new onset a-fib and GI bleed. His PE was diagnosed by his PCP through outpatient w/u and he was placed on Xarelto. His first hospital admission was from 4/15-4/24. At that time his CC was chest pain. W/u suggested a diagnosis of pericarditis. An Echocardiogram was done, which showed moderate to large pericardial effusion. Meanwhile the night of 4/16 he went in to afib with RVR, probably secondary to PE and was transferred to step down and started on cardizem drip. He converted to sinus the same night , cardizem discontinued and he was resumed on metoprolol. CT surgery was consulted for pericardial window. He underwent pericardial window on 4/17 and hemorrhagic fluid was drained and was sent for analysis. Gram stain and cultures were negative. He was also started on colchicine for his pericarditis. The evening of 4/20 pt went back in to afib with RVR and had bloody bowel movement. His xarelto was discontinued and he was put on cardizem gtt. Over the next 24 hours his hemoglobin dropped to 7.8 and he received 2 units of prbc transfusion. His repeat H&H improved. Dr Henrene Pastor was consulted from GI on 4/20 and he underwent endoscopy on 4/21 . He was found to have superficial ulcers in the stomach, underwent biopsy of the area. He was recommended to continue on PPI twice daily for 2 weeks and resume xarelto. He was discharged home then returned 3 days later on 07/28/13 with a complaint of increased fatigue and SOB. Repeat 2D echo demonstrated only minimal  residual effusion with normal RV and LV function. 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 He was admitted and underwent a repeat EGD which showed healing of ulcers, no bleed. He was continued on PO iron and a PPI. His Hgb at time of discharge was stable at 9.0. He was discharged home. Cardiology had recommended an OP stress test. This was done 08/03/13 and showed normal myocardial perfusion and normal LV EF of 58%.  He returns today and has reported a marked improvement in his shortness of breath. He is able to do most activities without any difficulty. He continues on Eliquis for his pulmonary embolus and for stroke prevention. He is on flecainide for antiarrhythmic therapy. He denies any chest pain. His only complaint is fatigue. He reports some fatigue and notices it worse as he has been working on refurbishing a house that his mother used to own to sell. He reports his sleep is good at night however he does have difficulty falling asleep which may be playing a role in his fatigue.  PMHx:  Past Medical History  Diagnosis Date  . Benign prostatic hypertrophy     several prostate biopsies neg for cancer.   . Hypertension   . Gout   . Depression   . FH: colonic polyps 2010  . Morbid obesity   . Complication of anesthesia     MORPHINE CAUSES HALLUCINATIONS  . Asthma     childhood asthma  . Numbness     feet / legs  . History of  kidney stones   . Kidney cysts   . Sleep apnea     USES C PAP  . Bilateral hip pain   . PAF (paroxysmal atrial fibrillation) with RVR 07/29/2013  . Anemia 07/29/2013    Past Surgical History  Procedure Laterality Date  . Total knee replacment      bilat  . Cholecystectomy  1989  . Trigger finger repair      also lue, cts surgically repaired  . Renal calculi    . Tonsillectomy    . Lithotripsy    . Arthroscopy knee w/ drilling    . Breath tek h pylori N/A 12/23/2012    Procedure: BREATH TEK H PYLORI;  Surgeon: Gayland Curry, MD;  Location: [facility];  Service: General;  Laterality: N/A;  . Wisdom tooth extraction    . Laparoscopic gastric banding with hiatal hernia repair N/A 04/08/2013    Procedure: LAPAROSCOPIC GASTRIC BANDING WITH possible  HIATAL HERNIA REPAIR;  Surgeon: Gayland Curry, MD;  Location: [facility];  Service: General;  Laterality: N/A;  . Subxyphoid pericardial window N/A 07/18/2013    Procedure: SUBXYPHOID PERICARDIAL WINDOW;  Surgeon: Grace Isaac, MD;  Location: [place];  Service: Thoracic;  Laterality: N/A;  . Intraoperative transesophageal echocardiogram N/A 07/18/2013    Procedure: INTRAOPERATIVE TRANSESOPHAGEAL ECHOCARDIOGRAM;  Surgeon: Grace Isaac, MD;  Location: [place];  Service: Open Heart Surgery;  Laterality: N/A;  . Esophagogastroduodenoscopy N/A 07/22/2013    Procedure: ESOPHAGOGASTRODUODENOSCOPY (EGD);  Surgeon: Irene Shipper, MD;  Location: [facility] ENDOSCOPY;  Service: Endoscopy;  Laterality: N/A;  . Colonoscopy N/A 07/31/2013    Procedure: COLONOSCOPY;  Surgeon: Gatha Mayer, MD;  Location: [place];  Service: Endoscopy;  Laterality: N/A;  . Esophagogastroduodenoscopy N/A 07/31/2013    Procedure: ESOPHAGOGASTRODUODENOSCOPY (EGD);  Surgeon: Gatha Mayer, MD;  Location: [facility] ENDOSCOPY;  Service: Endoscopy;  Laterality: N/A;    FAMHx:  Family History  Problem Relation Age of Onset  . Depression Mother   . Hypertension Mother   . Heart disease Father     MI  . Depression Brother   . Stroke Paternal Aunt     CVA  . Diabetes Paternal Uncle   . Heart disease Paternal Uncle     MI  . Heart disease Paternal Grandmother     MI  . Diabetes Cousin     PATERNAL    SOCHx:   reports that he quit smoking about 39 years ago. His smoking use included Cigarettes. He smoked 0.00 packs per day. He does not have any smokeless tobacco history on file. He reports that he drinks alcohol. He reports that he does not use illicit drugs.  ALLERGIES:  Allergies  Allergen Reactions  . Propoxyphene  N-Acetaminophen Nausea Only  . Codeine     nausea  . Morphine     Nausea Severe hallucinations  . Penicillins     LOC with shot    ROS: A comprehensive review of systems was negative except for: Cardiovascular: positive for fatigue  HOME MEDS: Current Outpatient Prescriptions  Medication Sig Dispense Refill  . CALCIUM PO Take 500 mg by mouth 3 (three) times daily.     . Cyanocobalamin (VITAMIN B-12) 1000 MCG SUBL Place 1,000 mcg under the tongue every morning.    [name] Sodium (DSS) 100 MG CAPS Take 100 mg by mouth every other day.    [name] ELIQUIS 5 MG TABS tablet TAKE ONE TABLET BY MOUTH TWICE DAILY 60 tablet 5  .  EQ ALLERGY RELIEF 10 MG tablet TAKE ONE TABLET BY MOUTH ONCE DAILY 30 tablet 5  . finasteride (PROSCAR) 5 MG tablet Take 5 mg by mouth daily.     . flecainide (TAMBOCOR) 50 MG tablet TAKE ONE TABLET BY MOUTH EVERY 12 HOURS 60 tablet 5  . fluticasone (FLONASE) 50 MCG/ACT nasal spray Place 2 sprays into both nostrils as needed.     . furosemide (LASIX) 20 MG tablet Take 1 tablet (20 mg total) by mouth daily as needed (as needed for swelling in the legs). 90 tablet 1  . LORazepam (ATIVAN) 0.5 MG tablet Take 1 tab in the AM and 2 at bed time. 90 tablet 2  . montelukast (SINGULAIR) 10 MG tablet Take 1 tablet (10 mg total) by mouth at bedtime. 30 tablet 5  . Multiple Vitamin (MULTIVITAMIN) tablet Take 1 tablet by mouth daily. Chewable tablet with iron    . pantoprazole (PROTONIX) 40 MG tablet TAKE ONE TABLET BY MOUTH TWICE DAILY 180 tablet 1  . potassium chloride (K-DUR,KLOR-CON) 10 MEQ tablet Take 10 mEq by mouth 3 (three) times daily.    . risperiDONE (RISPERDAL) 0.5 MG tablet Take 1 tablet (0.5 mg total) by mouth at bedtime. 30 tablet 2  . sertraline (ZOLOFT) 100 MG tablet Take 1.5 tablets (150 mg total) by mouth daily with breakfast. 45 tablet 2  . Tetrahydrozoline HCl (VISINE OP) Place 2 drops into both eyes daily as needed (dry eyes).     No current facility-administered  medications for this visit.    LABS/IMAGING: No results found for this or any previous visit (from the past 48 hour(s)). No results found.  VITALS: BP 142/88 mmHg  Pulse 65  Ht 6' (1.829 m)  Wt 311 lb 1.6 oz (141.114 kg)  BMI 42.18 kg/m2  EXAM: General appearance: alert and no distress Neck: no carotid bruit and no JVD Lungs: clear to auscultation bilaterally Heart: regular rate and rhythm, S1, S2 normal, no murmur, click, rub or gallop Abdomen: soft, non-tender; bowel sounds normal; no masses,  no organomegaly and obese Extremities: extremities normal, atraumatic, no cyanosis or edema Pulses: 2+ and symmetric Skin: Skin color, texture, turgor normal. No rashes or lesions Neurologic: Grossly normal Psych: Normal  EKG: Normal sinus rhythm at 65  ASSESSMENT: 1. Paroxysmal atrial fibrillation on flecainide 2. Hyperlipidemia 3. Hypertension 4. Recent history of pericarditis/pericardial effusion status post pericardial window 5. Pulmonary embolus 6. Morbid obesity 7. Obstructive sleep apnea on CPAP  PLAN: 1.   Mr. Zeman is now feeling better without the shortness of breath that he was feeling earlier. He does have fatigue which is somewhat nonspecific. He has trouble falling asleep at night which may be playing a role in it. He is using CPAP. He denies the shortness of breath that he was feeling earlier. He is in need of weight loss and is going to be working with the dietitian on that. He does have a history of weight loss surgery. He maintained sinus rhythm on flecainide. It is unclear to me whether his A. fib was just related to his pericardial effusion or whether he would benefit risk to develop A. fib without effusion. It is difficult to know whether he needs the flecainide long-term or not however I'm hesitant to stop it as he may redevelop A. fib and that might set him up for need for cardioversion. For now I would recommend continuing his flecainide and his Eliquis, which  he is taking without any bleeding difficulties.  Plan  follow-up in 6 months.  Pixie Casino, MD, [facility] Attending Cardiologist [facility]  Dabney Dever C 03/12/2014, 3:03 PM

## 2014-03-13 ENCOUNTER — Encounter (HOSPITAL_COMMUNITY): Payer: Self-pay

## 2014-03-16 ENCOUNTER — Encounter (HOSPITAL_COMMUNITY): Payer: Self-pay

## 2014-03-18 ENCOUNTER — Encounter (HOSPITAL_COMMUNITY): Payer: Self-pay

## 2014-03-19 DIAGNOSIS — Z9884 Bariatric surgery status: Secondary | ICD-10-CM | POA: Diagnosis not present

## 2014-03-19 DIAGNOSIS — Z6841 Body Mass Index (BMI) 40.0 and over, adult: Secondary | ICD-10-CM | POA: Diagnosis not present

## 2014-03-20 ENCOUNTER — Encounter (HOSPITAL_COMMUNITY): Payer: Self-pay

## 2014-03-23 ENCOUNTER — Encounter (HOSPITAL_COMMUNITY): Payer: Self-pay

## 2014-03-25 ENCOUNTER — Telehealth: Payer: Self-pay | Admitting: Internal Medicine

## 2014-03-25 ENCOUNTER — Encounter (HOSPITAL_COMMUNITY): Payer: Self-pay

## 2014-03-25 NOTE — Telephone Encounter (Signed)
Needs OV to determine

## 2014-03-25 NOTE — Telephone Encounter (Signed)
Patient advised and transferred to scheduling  

## 2014-03-25 NOTE — Telephone Encounter (Signed)
Would like to know if Dr. [name] is going to take him off of eliquis

## 2014-03-30 ENCOUNTER — Ambulatory Visit (INDEPENDENT_AMBULATORY_CARE_PROVIDER_SITE_OTHER): Payer: Medicare Other | Admitting: Internal Medicine

## 2014-03-30 ENCOUNTER — Encounter: Payer: Self-pay | Admitting: Internal Medicine

## 2014-03-30 ENCOUNTER — Encounter (HOSPITAL_COMMUNITY): Payer: Self-pay

## 2014-03-30 VITALS — BP 122/80 | HR 67 | Temp 98.6°F | Resp 15 | Ht 72.0 in | Wt 316.5 lb

## 2014-03-30 DIAGNOSIS — I2699 Other pulmonary embolism without acute cor pulmonale: Secondary | ICD-10-CM

## 2014-03-30 NOTE — Progress Notes (Signed)
Pre visit review using our clinic review tool, if applicable. No additional management support is needed unless otherwise documented below in the visit note. 

## 2014-03-30 NOTE — Assessment & Plan Note (Addendum)
Complete Eliquis & check D dimer  in approximately 4 weeks off Eliquis If D dimer +; venous Doppler

## 2014-03-30 NOTE — Patient Instructions (Signed)
D-dimer is a screening test for clotting disorders including deep venous thrombosis ( clots in the legs)  as well as pulmonary emboli.(clots in lungs) This will be done the first week of Feb 2016 ;1 month off Eliquis.

## 2014-03-30 NOTE — Progress Notes (Signed)
   Subjective:    Patient ID: [name], male    DOB: Oct 20, 1945, 68 y.o.   MRN: [id number]  HPI  He has been on a novel oral anticoagulant since mid April of this year. This was for acute pulmonary thromboemboli in the context of a complicated hospital course.   He had gastric banding surgery and hiatal hernia repair 04/08/13. He states that he was fairly active postoperatively ,not sedentary.  He had no past history of deep venous thrombosis, pulmonary thromboemboli, or family history of same. He has had chronic lower extremity edema.  At the time of the pulmonary thromboemboli he was being treated for pericarditis, congestive heart failure, pericardial effusion, & atrial fibrillation.The etiology was felt be viral.  Initially he was placed on Xarelto but he had melena. A small gastric ulcer was found. Once a bleeding resolved; he was restarted on Eliquis which he has continued to the present.      Review of Systems   Chest pain, palpitations, tachycardia, exertional dyspnea, paroxysmal nocturnal dyspnea,or  claudication  are absent.  The edema has improved with furosemide  He is on CPAP at night with good control of his obstructive sleep apnea.       Objective:   Physical Exam  Appears healthy and well-nourished & in no acute distress  No carotid bruits are present.No neck vein distention present at 10 - 15 degrees. Thyroid normal to palpation  Heart rhythm and rate are normal with no gallop or murmur  Chest is clear with no increased work of breathing  There is no evidence of aortic aneurysm or renal artery bruits  Abdomen protuberant but soft with no organomegaly or masses. No HJR  No clubbing or cyanosis present. 1+ sockline edema  Pedal pulses are intact   No ischemic skin changes are present . Fingernails healthy   Alert and oriented. Strength, tone normal         Assessment & Plan:  See Current Assessment & Plan in Problem List under specific  Diagnosis

## 2014-04-01 ENCOUNTER — Encounter (HOSPITAL_COMMUNITY): Payer: Self-pay

## 2014-04-04 ENCOUNTER — Other Ambulatory Visit: Payer: Self-pay | Admitting: Internal Medicine

## 2014-05-08 ENCOUNTER — Other Ambulatory Visit: Payer: Medicare Other

## 2014-05-08 DIAGNOSIS — I2699 Other pulmonary embolism without acute cor pulmonale: Secondary | ICD-10-CM | POA: Diagnosis not present

## 2014-05-09 LAB — D-DIMER, QUANTITATIVE: D-Dimer, Quant: 0.74 ug/mL-FEU — ABNORMAL HIGH (ref 0.00–0.48)

## 2014-05-11 ENCOUNTER — Encounter: Payer: Medicare Other | Attending: General Surgery | Admitting: Dietician

## 2014-05-11 ENCOUNTER — Encounter: Payer: Self-pay | Admitting: Internal Medicine

## 2014-05-11 ENCOUNTER — Ambulatory Visit (INDEPENDENT_AMBULATORY_CARE_PROVIDER_SITE_OTHER): Payer: Medicare Other | Admitting: Internal Medicine

## 2014-05-11 VITALS — BP 140/78 | HR 74 | Temp 98.7°F | Ht 72.0 in | Wt 322.5 lb

## 2014-05-11 DIAGNOSIS — R609 Edema, unspecified: Secondary | ICD-10-CM

## 2014-05-11 DIAGNOSIS — R791 Abnormal coagulation profile: Secondary | ICD-10-CM | POA: Diagnosis not present

## 2014-05-11 DIAGNOSIS — D489 Neoplasm of uncertain behavior, unspecified: Secondary | ICD-10-CM | POA: Diagnosis not present

## 2014-05-11 DIAGNOSIS — Z6841 Body Mass Index (BMI) 40.0 and over, adult: Secondary | ICD-10-CM | POA: Diagnosis not present

## 2014-05-11 DIAGNOSIS — G4730 Sleep apnea, unspecified: Secondary | ICD-10-CM

## 2014-05-11 DIAGNOSIS — I2699 Other pulmonary embolism without acute cor pulmonale: Secondary | ICD-10-CM | POA: Diagnosis not present

## 2014-05-11 DIAGNOSIS — Z713 Dietary counseling and surveillance: Secondary | ICD-10-CM | POA: Diagnosis not present

## 2014-05-11 DIAGNOSIS — R7989 Other specified abnormal findings of blood chemistry: Secondary | ICD-10-CM

## 2014-05-11 MED ORDER — ASPIRIN EC 325 MG PO TBEC: 325.0000 mg | DELAYED_RELEASE_TABLET | Freq: Every day | ORAL | Status: DC

## 2014-05-11 NOTE — Assessment & Plan Note (Signed)
[place] of BLE

## 2014-05-11 NOTE — Patient Instructions (Addendum)
-  Get back into healthy eating routine!  -Pre-plan meals and keep healthy foods available  -Pre-portion foods -Continue to eat at least 3x a day -Focus on lean meat and non-starchy vegetables  -Limit carbs (especially sugar)! -Increase physical activity as tolerated -Log foods and exercise

## 2014-05-11 NOTE — Patient Instructions (Addendum)
The Venous Doppler will be scheduled and you'll be notified of the time.Please call the Referral Co-Ordinator @ [phone] if you have not been notified of appointment time within 7-10 days.  Please take enteric-coated aspirin 325 mg daily with breakfast.  The Dermatology  referral will be scheduled and you'll be notified of the time.Please call the Referral Co-Ordinator @ [phone] if you have not been notified of appointment time within 7-10 days.

## 2014-05-11 NOTE — Progress Notes (Signed)
   Subjective:    Patient ID: [name], male    DOB: [birth year], 69 y.o.   MRN: [id number]  HPI  He has been off the novel oral anticoagulant since early January of 2016 and is asymptomatic in reference to cardiopulmonary symptoms.  His complicated course which involved the pulmonary thromboembolus in April 2015 in the context of pericarditis and sedentary lifestyle following bariatric surgery was reviewed and summarized in the problem list.  D-dimer done 05/08/14 was mildly elevated at 0.74; again he denies any symptoms of leg pain or change in his chronic  peripheral edema.  Additionally has chronic numbness in both lower extremities.   Review of Systems   Chest pain, palpitations, tachycardia, exertional dyspnea, paroxysmal nocturnal dyspnea,or  claudication  are absent. He has been compliant with his CPAP for sleep apnea.  Papular lesion L medial infra ocular area may be enlarging      Objective:   Physical Exam   General appearance :adequately nourished; in no distress. As per CDC Guidelines ,Epic documents severe obesity as being present .  Eyes: No conjunctival inflammation or scleral icterus is present.    Heart:  Normal rate and regular rhythm. S1 and S2 normal without gallop, murmur, click, rub or other extra sounds     Lungs:Chest clear to auscultation; no wheezes, rhonchi,rales ,or rubs present.No increased work of breathing.   Abdomen: Massive abdomen;bowel sounds normal, soft and non-tender without masses, organomegaly or hernias noted.  No guarding or rebound.   Vascular : all pulses equal ; no bruits present.1+ edema of lower extremities. Homan's negative bilaterally Skin:Warm & dry.  Intact without  rashes ; no jaundice or tenting. Smooth, papular 6X5 mm nevus L medial maxillary area  Lymphatic: No lymphadenopathy is noted about the head, neck, axilla  Neuro: Strength, tone  normal. .           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

## 2014-05-11 NOTE — Progress Notes (Signed)
  Follow-up visit: 11 months Post-Operative LAGB Surgery  Medical Nutrition Therapy:  Appt start time: 215 end time:  240  Primary concerns today: Post-operative Bariatric Surgery Nutrition Management.  [name] returns today having gained 5 lbs of fat. He is eating excessive carbs like bread, pizza, crackers, and the occasional milkshake. Avoids walking due to knee and hip pain.   Surgery date: 04/08/2013  Surgery type: LAGB  Starting weight at [facility]: 360 lbs on 08/29/12  Weight today: 320 lbs Weight change: 12.5 lbs gain (5 lbs fat)  Total weight lost: 40 lbs  Goal: 190s   TANITA BODY COMP RESULTS   04/22/13  05/23/13 07/09/13 10/08/13 11/19/13 01/06/14 03/10/14 05/11/14  BMI (kg/m^2)  52.1  43.1 41.4 40.3 40.3 41.1 41.7 43.4  Fat Mass (lbs)  179.0  115.5 109 108.5 108.5 107.5 110 115  Fat Free Mass (lbs)  143.5  202.5 196.5 189 189 195.5 197.5 205  Total Body Water (lbs)  105.0  148 144 138.5 138.5 143 144.5 150     Preferred Learning Style:   No preference indicated   Learning Readiness:  Ready  24-hr recall: B (AM): Premier protein shake (30g); 1-2 cups of coffee Snk (AM): none  L (2 PM): hamburger or another protein shake Snk (PM):  D (PM): almost a whole pizza over the course of yesterday   Snk (PM): cheese or cashews or pecans or Klondike bar occasionally  Fluid intake: drinking constantly, water, decaf coffee with cream, protein shakes (at least 64 oz)  Estimated total protein intake: 80-90g per patient report  Medications: see list; recently taken off Eliquis; still taking Lasix daily Supplementation: taking  Using straws: yes  Drinking while eating: occasionally Hair loss: none Carbonated beverages: none N/V/D/C: none Last Lap-Band fill: has had a recent fill (not sure when or how much)  Recent physical activity:  ADLs  Progress Towards Goal(s):  In progress.    Nutritional Diagnosis:  Treutlen-3.3 Overweight/obesity related to past poor dietary habits and physical  inactivity as evidenced by patient w/ recent LAGB surgery following dietary guidelines for continued weight loss.    Intervention:  Nutrition education provided.  Teaching Method Utilized:  Visual Auditory  Barriers to learning/adherence to lifestyle change: none  Demonstrated degree of understanding via:  Teach Back   Monitoring/Evaluation:  Dietary intake, exercise, lap band fills, and body weight. Follow up in 6 weeks for 14.5 month post-op visit.

## 2014-05-11 NOTE — Progress Notes (Signed)
Pre visit review using our clinic review tool, if applicable. No additional management support is needed unless otherwise documented below in the visit note. 

## 2014-05-15 ENCOUNTER — Encounter (HOSPITAL_COMMUNITY): Payer: Self-pay

## 2014-05-15 ENCOUNTER — Ambulatory Visit (HOSPITAL_COMMUNITY): Payer: Medicare Other | Attending: Internal Medicine | Admitting: Cardiology

## 2014-05-15 DIAGNOSIS — R609 Edema, unspecified: Secondary | ICD-10-CM | POA: Diagnosis not present

## 2014-05-15 DIAGNOSIS — I2699 Other pulmonary embolism without acute cor pulmonale: Secondary | ICD-10-CM | POA: Diagnosis present

## 2014-05-15 DIAGNOSIS — R791 Abnormal coagulation profile: Secondary | ICD-10-CM | POA: Diagnosis not present

## 2014-05-15 DIAGNOSIS — R7989 Other specified abnormal findings of blood chemistry: Secondary | ICD-10-CM

## 2014-05-15 NOTE — Progress Notes (Signed)
Bilateral LE venous duplex performed. 

## 2014-05-25 ENCOUNTER — Other Ambulatory Visit: Payer: Self-pay

## 2014-05-25 MED ORDER — FUROSEMIDE 20 MG PO TABS
20.0000 mg | ORAL_TABLET | Freq: Every day | ORAL | Status: DC | PRN
Start: 2014-05-25 — End: 2014-11-12

## 2014-05-27 ENCOUNTER — Encounter (HOSPITAL_COMMUNITY): Payer: Self-pay | Admitting: Psychiatry

## 2014-05-27 ENCOUNTER — Ambulatory Visit (HOSPITAL_COMMUNITY): Payer: Self-pay | Admitting: Psychiatry

## 2014-05-27 ENCOUNTER — Ambulatory Visit (INDEPENDENT_AMBULATORY_CARE_PROVIDER_SITE_OTHER): Payer: Medicare Other | Admitting: Psychiatry

## 2014-05-27 VITALS — BP 154/82 | HR 71 | Ht 72.0 in | Wt 321.0 lb

## 2014-05-27 DIAGNOSIS — F339 Major depressive disorder, recurrent, unspecified: Secondary | ICD-10-CM

## 2014-05-27 DIAGNOSIS — R7989 Other specified abnormal findings of blood chemistry: Secondary | ICD-10-CM

## 2014-05-27 DIAGNOSIS — F330 Major depressive disorder, recurrent, mild: Secondary | ICD-10-CM

## 2014-05-27 DIAGNOSIS — I2699 Other pulmonary embolism without acute cor pulmonale: Secondary | ICD-10-CM

## 2014-05-27 MED ORDER — RISPERIDONE 0.5 MG PO TABS: 0.5000 mg | ORAL_TABLET | Freq: Every day | ORAL | Status: DC

## 2014-05-27 MED ORDER — LORAZEPAM 0.5 MG PO TABS: ORAL_TABLET | ORAL | Status: DC

## 2014-05-27 MED ORDER — SERTRALINE HCL 100 MG PO TABS: 150.0000 mg | ORAL_TABLET | Freq: Every day | ORAL | Status: DC

## 2014-05-27 NOTE — Progress Notes (Signed)
[name] Progress Note  [name] [id number] 69 y.o.  05/27/2014 11:42 AM  Chief Complaint:  Medication management and follow-up.             History of Present Illness:  [name] came for his appointment.  He is taking his medication and denies any side effects.  He is feeling less anxious less depressed and he endorsed that his medicine is working very well.  He denies any major panic attack.  He recently seen his primary care physician and there has been no changes other than his blood thinner is discontinued and he is taking baby aspirin.  Patient denies any crying spells, irritability, anger or any mood swing.  He denies any feeling of hopelessness or worthlessness.  He has some concern about his physical health but he is overall doing much better.  His appetite is okay.  His vitals are stable.  He is going to sleep late but he is getting enough sleep.  Patient is compliant with the Risperdal, Zoloft, Ativan and denies any tremors or shakes.  Patient lives with his wife who is very supportive.   His mother is in 63s and she lives in nursing home.  Suicidal Ideation: No Plan Formed: No Patient has means to carry out plan: No  Homicidal Ideation: No Plan Formed: No Patient has means to carry out plan: No  Review of Systems: Psychiatric: Agitation: No Hallucination: No Depressed Mood: No Insomnia: No Hypersomnia: No Altered Concentration: No Feels Worthless: No Grandiose Ideas: No Belief In Special Powers: No New/Increased Substance Abuse: No Compulsions: No  Neurologic: Headache: No Seizure: No Paresthesias: No  Past Medical History  Diagnosis Date  . Benign prostatic hypertrophy     several prostate biopsies neg for cancer.   . Hypertension   . Gout   . Depression   . FH: colonic polyps 2010  . Morbid obesity   . Complication of anesthesia     MORPHINE CAUSES HALLUCINATIONS  . Asthma     childhood asthma  . Numbness     feet / legs   . History of kidney stones   . Kidney cysts   . Sleep apnea     USES C PAP  . Bilateral hip pain   . PAF (paroxysmal atrial fibrillation) with RVR 07/29/2013  . Anemia 07/29/2013  Patient see Dr. [name].  Psychosocial History: Patient lives with his wife.  His wife is very supportive.  Outpatient Encounter Prescriptions as of 05/27/2014  Medication Sig  . aspirin EC 325 MG tablet Take 1 tablet (325 mg total) by mouth daily.  [name] CALCIUM PO Take 500 mg by mouth 3 (three) times daily.   . Cyanocobalamin (VITAMIN B-12) 1000 MCG SUBL Place 1,000 mcg under the tongue every morning.  [name] Sodium (DSS) 100 MG CAPS Take 100 mg by mouth every other day.  Noelle Penner ALLERGY RELIEF 10 MG tablet TAKE ONE TABLET BY MOUTH ONCE DAILY  . finasteride (PROSCAR) 5 MG tablet Take 5 mg by mouth daily.   . flecainide (TAMBOCOR) 50 MG tablet TAKE ONE TABLET BY MOUTH EVERY 12 HOURS  . fluticasone (FLONASE) 50 MCG/ACT nasal spray Place 2 sprays into both nostrils as needed.   . furosemide (LASIX) 20 MG tablet Take 1 tablet (20 mg total) by mouth daily as needed (as needed for swelling in the legs).  . LORazepam (ATIVAN) 0.5 MG tablet Take 1 tab in the AM and 2 at bed time.  . montelukast (  SINGULAIR) 10 MG tablet Take 1 tablet (10 mg total) by mouth at bedtime.  . Multiple Vitamin (MULTIVITAMIN) tablet Take 1 tablet by mouth daily. Chewable tablet with iron  . pantoprazole (PROTONIX) 40 MG tablet TAKE ONE TABLET BY MOUTH TWICE DAILY  . potassium chloride (K-DUR,KLOR-CON) 10 MEQ tablet TAKE ONE TABLET BY MOUTH THREE TIMES DAILY  . risperiDONE (RISPERDAL) 0.5 MG tablet Take 1 tablet (0.5 mg total) by mouth at bedtime.  . sertraline (ZOLOFT) 100 MG tablet Take 1.5 tablets (150 mg total) by mouth daily with breakfast.  . Tetrahydrozoline HCl (VISINE OP) Place 2 drops into both eyes daily as needed (dry eyes).  . [DISCONTINUED] LORazepam (ATIVAN) 0.5 MG tablet Take 1 tab in the AM and 2 at bed time.  . [DISCONTINUED]  risperiDONE (RISPERDAL) 0.5 MG tablet Take 1 tablet (0.5 mg total) by mouth at bedtime.  . [DISCONTINUED] sertraline (ZOLOFT) 100 MG tablet Take 1.5 tablets (150 mg total) by mouth daily with breakfast.    Past Psychiatric History/Hospitalization(s) Patient has one psychiatric admission in 2006 due to increased depression and having suicidal thoughts.  Since then patient has been seen in this office and feeling stable on his psychiatric medication. Anxiety: Yes Bipolar Disorder: No Depression: Yes Mania: No Psychosis: No Schizophrenia: No Personality Disorder: No Hospitalization for psychiatric illness: Yes History of Electroconvulsive Shock Therapy: No Prior Suicide Attempts: No  Physical Exam: Constitutional:  BP 154/82 mmHg  Pulse 71  Ht 6' (1.829 m)  Wt 321 lb (145.605 kg)  BMI 43.53 kg/m2  Musculoskeletal: Strength & Muscle Tone: within normal limits Gait & Station: normal Patient leans: N/A  Mental Status Examination;  Patient is casually dressed and fairly groomed.  He is pleasant and cooperative.  He maintained good eye contact.  His his speech is soft, clear and coherent.  He described his mood good and his affect is improved from the past.  His thought process is logical linear and goal directed.  There were no flight if ideas of any loose association.  He denies any active or passive suicidal thoughts and homicidal thoughts.  His fund of knowledge is adequate.  There were no paranoia or delusions present at this time.  His attention concentration is fair.  He is alert and oriented x3.  His insight judgment and impulse control is okay.   Established Problem, Stable/Improving (1), Review of Psycho-Social Stressors (1), Review of Last Therapy Session (1) and Review of Medication Regimen & Side Effects (2)  Assessment: Axis I: Maj. depressive disorder, recurrent  Axis II: Deferred  Axis III:  Past Medical History  Diagnosis Date  . Benign prostatic hypertrophy      several prostate biopsies neg for cancer.   . Hypertension   . Gout   . Depression   . FH: colonic polyps 2010  . Morbid obesity   . Complication of anesthesia     MORPHINE CAUSES HALLUCINATIONS  . Asthma     childhood asthma  . Numbness     feet / legs  . History of kidney stones   . Kidney cysts   . Sleep apnea     USES C PAP  . Bilateral hip pain   . PAF (paroxysmal atrial fibrillation) with RVR 07/29/2013  . Anemia 07/29/2013   Plan:  Patient is stable on his current medication.  He has no side effects or any concern.  I will continue Zoloft 150 mg daily, Risperdal 0.5 mg at bedtime and Ativan 0.5 mg in the  morning and 1 mg at bedtime.  Recommended to call us back if he has any question or any concern if he feel worsening of the symptoms.  I will see him again in 3 months.  Izza Bickle T., MD 05/27/2014

## 2014-05-29 ENCOUNTER — Telehealth: Payer: Self-pay | Admitting: Internal Medicine

## 2014-05-29 ENCOUNTER — Ambulatory Visit (INDEPENDENT_AMBULATORY_CARE_PROVIDER_SITE_OTHER): Payer: Medicare Other | Admitting: Internal Medicine

## 2014-05-29 ENCOUNTER — Other Ambulatory Visit: Payer: Self-pay | Admitting: Internal Medicine

## 2014-05-29 ENCOUNTER — Encounter: Payer: Self-pay | Admitting: Internal Medicine

## 2014-05-29 VITALS — BP 128/72 | HR 73 | Temp 98.8°F | Ht 72.0 in | Wt 319.0 lb

## 2014-05-29 DIAGNOSIS — J309 Allergic rhinitis, unspecified: Secondary | ICD-10-CM

## 2014-05-29 DIAGNOSIS — G4730 Sleep apnea, unspecified: Secondary | ICD-10-CM

## 2014-05-29 MED ORDER — SULFAMETHOXAZOLE-TRIMETHOPRIM 800-160 MG PO TABS: 1.0000 | ORAL_TABLET | Freq: Two times a day (BID) | ORAL | Status: DC

## 2014-05-29 NOTE — Telephone Encounter (Signed)
Phone call to patient. He states his current CPAP machine he got in 2010. He states he thought you ordered it as a result of his sleep study

## 2014-05-29 NOTE — Telephone Encounter (Signed)
Pt called in and said that [facility] can give him every but the CPAP machine.  They told him that they need a order from Dr Donivan Scull in order to get a new one.    Fax number [phone]

## 2014-05-29 NOTE — Progress Notes (Signed)
Pre visit review using our clinic review tool, if applicable. No additional management support is needed unless otherwise documented below in the visit note. 

## 2014-05-29 NOTE — Telephone Encounter (Signed)
How old is it & who ordered it? ( ?dr Debara Pickett)

## 2014-05-29 NOTE — Patient Instructions (Signed)
Plain Mucinex (NOT D) for thick secretions ;force NON dairy fluids .   Nasal cleansing in the shower as discussed with lather of mild shampoo.After 10 seconds wash off lather while  exhaling through nostrils. Make sure that all residual soap is removed to prevent irritation.  Flonase OR Nasacort AQ 1 spray in each nostril twice a day as needed. Use the "crossover" technique into opposite nostril spraying toward opposite ear @ 45 degree angle, not straight up into nostril.  Plain Allegra (NOT D )  160 daily , Loratidine 10 mg , OR Zyrtec 10 mg @ bedtime  as needed for itchy eyes & sneezing.  Fill the  prescription for antibiotic if fever, discolored nasal or chest secretions or significant pain above & below eyes appear in the next 72 hours.

## 2014-05-29 NOTE — Progress Notes (Signed)
   Subjective:    Patient ID: [name], male    DOB: 03/17/1946, 69 y.o.   MRN: [id number]  HPI Symptoms began 05/27/14 as a scratchy throat followed by rhinitis, sneezing, and watery eyes. He questions whether his furnace filter is dirty. He also uses a CPAP mask which have some mold.  He's had some frontal headache as well as some discomfort under the eyes but this was improved following a hot shower. He also has had some dental pain.   Review of Systems  He specifically denies fever, chills, sweats, nasal purulence, cough, shortness of breath, or wheezing.    Objective:   Physical Exam    Pertinent or positive findings include : He has bilateral hearing aids. There is marked erythema of the nares particularly the right nasal septum. Skin is damp.   General appearance:Adequately nourished; no acute distress or increased work of breathing is present. BMI 43.25.  No  lymphadenopathy about the head, neck, or axilla noted.   Eyes: No conjunctival inflammation or lid edema is present. There is no scleral icterus.  Ears:  External ear exam shows no significant lesions or deformities.    Nose:  External nasal examination shows no deformity or inflammation.  No septal dislocation or deviation.No obstruction to airflow.   Oral exam: Dental hygiene is good; lips and gums are healthy appearing.There is no oropharyngeal erythema or exudate noted.   Neck:  No deformities, thyromegaly, masses, or tenderness noted.   Supple with full range of motion without pain.   Heart:  Normal rate and regular rhythm. S1 and S2 normal without gallop, murmur, click, rub or other extra sounds.   Lungs:Chest clear to auscultation; no wheezes, rhonchi,rales ,or rubs present.  Extremities:  No cyanosis, edema, or clubbing  noted    Skin: Warm & dry w/o jaundice or tenting.       Assessment & Plan:  #1 allergic rhinitis See orders & AVS

## 2014-05-29 NOTE — Telephone Encounter (Signed)
If machine is 69 years old ; I recommend re-evaluation by Sleep Medicine specialists to guarantee optimal equipment received. Order entered

## 2014-06-01 NOTE — Telephone Encounter (Signed)
Patient has been advised he will receive a call for reevaluation of his cpap machine

## 2014-06-11 DIAGNOSIS — Z4651 Encounter for fitting and adjustment of gastric lap band: Secondary | ICD-10-CM | POA: Diagnosis not present

## 2014-06-12 ENCOUNTER — Telehealth: Payer: Self-pay

## 2014-06-13 ENCOUNTER — Other Ambulatory Visit: Payer: Self-pay | Admitting: Internal Medicine

## 2014-06-15 NOTE — Telephone Encounter (Signed)
LVM for the patient to call the practice to confirm date of flu vaccination or to call the office for an apt to take a flu shot. 

## 2014-06-18 ENCOUNTER — Ambulatory Visit (INDEPENDENT_AMBULATORY_CARE_PROVIDER_SITE_OTHER): Payer: Medicare Other | Admitting: Internal Medicine

## 2014-06-18 ENCOUNTER — Encounter: Payer: Self-pay | Admitting: Internal Medicine

## 2014-06-18 VITALS — BP 128/70 | HR 75 | Ht 72.0 in | Wt 321.6 lb

## 2014-06-18 DIAGNOSIS — Z9989 Dependence on other enabling machines and devices: Principal | ICD-10-CM

## 2014-06-18 DIAGNOSIS — L814 Other melanin hyperpigmentation: Secondary | ICD-10-CM | POA: Diagnosis not present

## 2014-06-18 DIAGNOSIS — G4733 Obstructive sleep apnea (adult) (pediatric): Secondary | ICD-10-CM

## 2014-06-18 DIAGNOSIS — G4761 Periodic limb movement disorder: Secondary | ICD-10-CM | POA: Diagnosis not present

## 2014-06-18 DIAGNOSIS — Z808 Family history of malignant neoplasm of other organs or systems: Secondary | ICD-10-CM | POA: Diagnosis not present

## 2014-06-18 DIAGNOSIS — D485 Neoplasm of uncertain behavior of skin: Secondary | ICD-10-CM | POA: Diagnosis not present

## 2014-06-18 DIAGNOSIS — L821 Other seborrheic keratosis: Secondary | ICD-10-CM | POA: Diagnosis not present

## 2014-06-18 MED ORDER — ROPINIROLE HCL 0.25 MG PO TABS: ORAL_TABLET | ORAL | Status: DC

## 2014-06-18 NOTE — Patient Instructions (Addendum)
Order- [facility]- replacement for old worn out CPAP machine, keep current pressure, mask of choice, humidifier, supplies, Airview, download for pressure compliance  Script for requip sent to see if it calms leg jerks at night  Please call as needed

## 2014-06-18 NOTE — Progress Notes (Signed)
06/18/14- 58 yoM former smoker referred courtesy of Dr Hopper-currently on CPAP through [facility]. Sleep study 2007 NPSG 10/25/05- severe OSA, AHI 79/ hr, CPAP to 16. Frequent limb jerks with sleep disturbance. Has been using CPAP, full face mask, [facility]. Thinks it causes more frequent colds.  He now needs old machine replaced- buttons sticking. Limb jerks do bother him at night- interested in trying treatment. Bedtime 11PM, latency 15-30 minutes, waking once before up at 10AM. Weight down 100 lbs in past 2 years after diet and lab-band bariatric surgery. HBP, asthma.. Medical problems with PAFib, hx PE- finished anticoagulation due to GI bleed. Bilateral TKR. Hx pericardial window sgy for pericardial effusion.  Prior to Admission medications   Medication Sig Start Date End Date Taking? Authorizing Provider  aspirin 81 MG tablet Take 81 mg by mouth daily.   Yes Historical Provider, MD  CALCIUM PO Take 500 mg by mouth 3 (three) times daily.    Yes Historical Provider, MD  Cyanocobalamin (VITAMIN B-12) 1000 MCG SUBL Place 1,000 mcg under the tongue every morning.   Yes Historical Provider, MD  Docusate Sodium (DSS) 100 MG CAPS Take 100 mg by mouth every other day. 07/25/13  Yes Kelvin Cellar, MD  EQ ALLERGY RELIEF 10 MG tablet TAKE ONE TABLET BY MOUTH ONCE DAILY 02/23/14  Yes Hendricks Limes, MD  finasteride (PROSCAR) 5 MG tablet Take 5 mg by mouth daily.    Yes Historical Provider, MD  flecainide (TAMBOCOR) 50 MG tablet TAKE ONE TABLET BY MOUTH EVERY 12 HOURS 02/23/14  Yes Hendricks Limes, MD  fluticasone [facility]) 50 MCG/ACT nasal spray Place 2 sprays into both nostrils as needed.  08/04/13  Yes Geradine Girt, DO  furosemide (LASIX) 20 MG tablet Take 1 tablet (20 mg total) by mouth daily as needed (as needed for swelling in the legs). 05/25/14  Yes Hendricks Limes, MD  LORazepam (ATIVAN) 0.5 MG tablet Take 1 tab in the AM and 2 at bed time. 05/27/14  Yes Kathlee Nations, MD  montelukast (SINGULAIR)  10 MG tablet TAKE ONE TABLET BY MOUTH AT BEDTIME 06/15/14  Yes Hendricks Limes, MD  Multiple Vitamin (MULTIVITAMIN) tablet Take 1 tablet by mouth daily. Chewable tablet with iron   Yes Historical Provider, MD  pantoprazole (PROTONIX) 40 MG tablet TAKE ONE TABLET BY MOUTH TWICE DAILY 03/02/14  Yes Hendricks Limes, MD  potassium chloride (K-DUR,KLOR-CON) 10 MEQ tablet TAKE ONE TABLET BY MOUTH THREE TIMES DAILY 04/06/14  Yes Hendricks Limes, MD  risperiDONE (RISPERDAL) 0.5 MG tablet Take 1 tablet (0.5 mg total) by mouth at bedtime. 05/27/14  Yes Kathlee Nations, MD  sertraline (ZOLOFT) 100 MG tablet Take 1.5 tablets (150 mg total) by mouth daily with breakfast. 05/27/14  Yes Kathlee Nations, MD  Tetrahydrozoline HCl (VISINE OP) Place 2 drops into both eyes daily as needed (dry eyes).   Yes Historical Provider, MD  rOPINIRole (REQUIP) 0.25 MG tablet 1-3 tabs at bedtime for limb jerks 06/18/14   Deneise Lever, MD   Past Medical History  Diagnosis Date  . Benign prostatic hypertrophy     several prostate biopsies neg for cancer.   . Hypertension   . Gout   . Depression   . FH: colonic polyps 2010  . Morbid obesity   . Complication of anesthesia     MORPHINE CAUSES HALLUCINATIONS  . Asthma     childhood asthma  . Numbness     feet / legs  .  History of kidney stones   . Kidney cysts   . Sleep apnea     USES C PAP  . Bilateral hip pain   . PAF (paroxysmal atrial fibrillation) with RVR 07/29/2013  . Anemia 07/29/2013   Past Surgical History  Procedure Laterality Date  . Total knee replacment      bilat  . Cholecystectomy  1989  . Trigger finger repair      also lue, cts surgically repaired  . Renal calculi    . Tonsillectomy    . Lithotripsy    . Arthroscopy knee w/ drilling    . Breath tek h pylori N/A 12/23/2012    Procedure: BREATH TEK H PYLORI;  Surgeon: Gayland Curry, MD;  Location: [facility];  Service: General;  Laterality: N/A;  . Wisdom tooth extraction    . Laparoscopic  gastric banding with hiatal hernia repair N/A 04/08/2013    Procedure: LAPAROSCOPIC GASTRIC BANDING WITH possible  HIATAL HERNIA REPAIR;  Surgeon: Gayland Curry, MD;  Location: [facility];  Service: General;  Laterality: N/A;  . Subxyphoid pericardial window N/A 07/18/2013    Procedure: SUBXYPHOID PERICARDIAL WINDOW;  Surgeon: Grace Isaac, MD;  Location: [place];  Service: Thoracic;  Laterality: N/A;  . Intraoperative transesophageal echocardiogram N/A 07/18/2013    Procedure: INTRAOPERATIVE TRANSESOPHAGEAL ECHOCARDIOGRAM;  Surgeon: Grace Isaac, MD;  Location: [place];  Service: Open Heart Surgery;  Laterality: N/A;  . Esophagogastroduodenoscopy N/A 07/22/2013    Procedure: ESOPHAGOGASTRODUODENOSCOPY (EGD);  Surgeon: Irene Shipper, MD;  Location[facility] END[facility]COPY;  Service: Endoscopy;  Laterality: N/A;  . Colonoscopy N/A 07/31/2013    Procedure: COLONOSCOPY;  Surgeon: Gatha Mayer, MD;  Location: [place];  Service: Endoscopy;  Laterality: N/A;  . Esophagogastroduodenoscopy N/A 07/31/2013    Procedure: ESOPHAGOGASTRODUODENOSCOPY (EGD);  Surgeon: Gatha Mayer, MD;  Location[facility] ENDOSCOPY;  Service: Endoscopy;  Laterality: N/A;   Family History  Problem Relation Age of Onset  . Depression Mother   . Hypertension Mother   . Heart disease Father     MI  . Depression Brother   . Stroke Paternal Aunt     CVA  . Diabetes Paternal Uncle   . Heart disease Paternal Uncle     MI  . Heart disease Paternal Grandmother     MI  . Diabetes Cousin     PATERNAL   History   Social History  . Marital Status: Married    Spouse Name: N/A  . Number of Children: 1  . Years of Education: N/A   Occupational History  . retired-Manager [employer]    Social History Main Topics  . Smoking status: Former Smoker -- 0.20 packs/day for 13 years    Types: Cigarettes    Start date: 04/03/1961    Quit date: 08/30/1974  . Smokeless tobacco: Not on file  . Alcohol Use: 0.0 oz/week    0 Standard drinks or  equivalent per week     Comment: Occasionally; 1 small drink a week  . Drug Use: No  . Sexual Activity: Not on file   Other Topics Concern  . Not on file   Social History Narrative   ROS-see HPI   Negative unless "+" Constitutional:    +weight loss, night sweats, fevers, chills, fatigue, lassitude. HEENT:    headaches, difficulty swallowing, tooth/dental problems, sore throat,       sneezing, itching, ear ache, nasal congestion, post nasal drip, snoring CV:    chest pain, orthopnea, PND,  swelling in lower extremities, anasarca,                                  dizziness, +palpitations Resp:   +shortness of breath with exertion or at rest.                productive cough,   +non-productive cough, coughing up of blood.              change in color of mucus.  wheezing.   Skin:    rash or lesions. GI:  No-   heartburn, indigestion, abdominal pain, nausea, vomiting, diarrhea,                 change in bowel habits, loss of appetite GU: dysuria, change in color of urine, no urgency or frequency.   flank pain. MS:   joint pain, stiffness, decreased range of motion, back pain. Neuro-     nothing unusual Psych:  change in mood or affect.  depression or anxiety.   memory loss.  OBJ- Physical Exam General- Alert, Oriented, Affect-appropriate, Distress- none acute, +obese Skin- rash-none, lesions- none, excoriation- none Lymphadenopathy- none Head- atraumatic            Eyes- Gross vision intact, PERRLA, conjunctivae and secretions clear            Ears- +Hearing aid            Nose- Clear, no-Septal dev, mucus, polyps, erosion, perforation             Throat- Mallampati III , mucosa clear , drainage- none, tonsils- atrophic Neck- flexible , trachea midline, no stridor , thyroid nl, carotid no bruit Chest - symmetrical excursion , unlabored           Heart/CV- RRR , no murmur , no gallop  , no rub, nl s1 s2                           - JVD- none , edema- none, stasis changes- none, varices-  none           Lung- clear to P&A, wheeze- none, cough- none , dullness-none, rub- none           Chest wall-  Abd-  Br/ Gen/ Rectal- Not done, not indicated Extrem- cyanosis- none, clubbing, none, atrophy- none, strength- nl Neuro- grossly intact to observation

## 2014-06-19 DIAGNOSIS — C44311 Basal cell carcinoma of skin of nose: Secondary | ICD-10-CM | POA: Diagnosis not present

## 2014-06-19 DIAGNOSIS — L570 Actinic keratosis: Secondary | ICD-10-CM | POA: Diagnosis not present

## 2014-06-19 DIAGNOSIS — G4761 Periodic limb movement disorder: Secondary | ICD-10-CM | POA: Insufficient documentation

## 2014-06-19 NOTE — Assessment & Plan Note (Signed)
This is probably not directly connected to his peripheral neuropathy Plan- try requip as discussed.

## 2014-06-19 NOTE — Assessment & Plan Note (Signed)
We reviewed physiology and medical concerns of OSA and discussed treatment goals and comfort management of CPAP. Continued weight loss will help. Plan replace old CPAP machine then get download for pressure compliance assessment.

## 2014-06-22 ENCOUNTER — Encounter: Payer: Medicare Other | Attending: General Surgery | Admitting: Dietician

## 2014-06-22 DIAGNOSIS — Z713 Dietary counseling and surveillance: Secondary | ICD-10-CM | POA: Diagnosis not present

## 2014-06-22 DIAGNOSIS — Z6841 Body Mass Index (BMI) 40.0 and over, adult: Secondary | ICD-10-CM | POA: Insufficient documentation

## 2014-06-22 NOTE — Progress Notes (Signed)
  Follow-up visit: 14 months Post-Operative LAGB Surgery  Medical Nutrition Therapy:  Appt start time: 315 end time:  330  Primary concerns today: Post-operative Bariatric Surgery Nutrition Management.  [name] returns today having lost 8 pounds. He saw Glendale Chard, PA for a band fill recently and after talking to him became motivated to start losing weight again. He has gone back on the pre op diet and focusing on lean meat. He also plans to start exercising on stationary bike again. Has gotten better about not keeping trigger foods in the house. However, his wife sometimes brings him milkshakes.  Surgery date: 04/08/2013  Surgery type: LAGB  Starting weight at [facility]: 360 lbs on 08/29/12  Weight today: 312.5 lbs Weight change: 7.5 lbs  Total weight lost: 47.5 lbs  Goal: 190s   TANITA BODY COMP RESULTS   04/22/13  05/23/13 07/09/13 10/08/13 11/19/13 01/06/14 03/10/14 05/11/14 06/22/14  BMI (kg/m^2)  52.1  43.1 41.4 40.3 40.3 41.1 41.7 43.4 42.4  Fat Mass (lbs)  179.0  115.5 109 108.5 108.5 107.5 110 115 126  Fat Free Mass (lbs)  143.5  202.5 196.5 189 189 195.5 197.5 205 186.5  Total Body Water (lbs)  105.0  148 144 138.5 138.5 143 144.5 150 136.5     Preferred Learning Style:   No preference indicated   Learning Readiness:  Ready  24-hr recall: B (AM): Premier protein shake (30g); 1-2 cups of decaf coffee with creamer Snk (AM): none  L (2 PM): baked chicken or hamburger or deli ham or tuna  Snk (4-5 PM): Premier protein shake D (PM): Premier protein shake Snk (PM):   Fluid intake: drinking constantly, water, decaf coffee with cream, protein shakes (at least 64 oz)  Estimated total protein intake: 80-90g per patient report  Medications: see list; Requip added Supplementation: taking  Using straws: yes  Drinking while eating: occasionally Hair loss: none Carbonated beverages: none N/V/D/C: none Last Lap-Band fill: has had a recent fill (not sure when or how much)  Recent  physical activity:  ADLs  Progress Towards Goal(s):  In progress.    Nutritional Diagnosis:  Lookout-3.3 Overweight/obesity related to past poor dietary habits and physical inactivity as evidenced by patient w/ recent LAGB surgery following dietary guidelines for continued weight loss.    Intervention:  Nutrition education provided.  Samples provided and patient instructed on proper use:   Teaching Method Utilized:  Visual Auditory  Barriers to learning/adherence to lifestyle change: none  Demonstrated degree of understanding via:  Teach Back   Monitoring/Evaluation:  Dietary intake, exercise, lap band fills, and body weight. Follow up in 6 weeks for 16 month post-op visit.

## 2014-06-22 NOTE — Patient Instructions (Addendum)
-  Get back into healthy eating routine!  -Pre-plan meals and keep healthy foods available  -Pre-portion foods -Continue to eat at least 3x a day -Focus on lean meat and non-starchy vegetables  -Limit carbs (especially sugar)! -Increase physical activity as tolerated -Log foods and exercise  -Eat solid food at least 2x a day -Fill up on nonstarchy (any veggie except corn, peas, or potatoes) vegetables and lean meats   TANITA BODY COMP RESULTS   04/22/13  05/23/13 07/09/13 10/08/13 11/19/13 01/06/14 03/10/14 05/11/14 06/22/14  BMI (kg/m^2)  52.1  43.1 41.4 40.3 40.3 41.1 41.7 43.4 42.4  Fat Mass (lbs)  179.0  115.5 109 108.5 108.5 107.5 110 115 126  Fat Free Mass (lbs)  143.5  202.5 196.5 189 189 195.5 197.5 205 186.5  Total Body Water (lbs)  105.0  148 144 138.5 138.5 143 144.5 150 136.5

## 2014-06-22 NOTE — Progress Notes (Signed)
Samples provided and patient instructed on proper use: Bariatric Advantage calcium citrate chew (orange - qty 12) Lot#: [id number] Exp:08/2014

## 2014-06-28 ENCOUNTER — Encounter: Payer: Self-pay | Admitting: Internal Medicine

## 2014-06-28 DIAGNOSIS — C4491 Basal cell carcinoma of skin, unspecified: Secondary | ICD-10-CM | POA: Insufficient documentation

## 2014-07-22 DIAGNOSIS — C44311 Basal cell carcinoma of skin of nose: Secondary | ICD-10-CM | POA: Diagnosis not present

## 2014-07-25 ENCOUNTER — Other Ambulatory Visit: Payer: Self-pay | Admitting: Internal Medicine

## 2014-07-27 NOTE — Telephone Encounter (Signed)
Ok to refill if not already done

## 2014-07-27 NOTE — Telephone Encounter (Signed)
Last OV: 06-18-14 Next OV: 08-20-14; CY please advise on refill. Thanks.

## 2014-08-03 ENCOUNTER — Encounter: Payer: Self-pay | Admitting: Internal Medicine

## 2014-08-04 ENCOUNTER — Ambulatory Visit: Payer: Self-pay | Admitting: Dietician

## 2014-08-05 ENCOUNTER — Encounter: Payer: Self-pay | Admitting: Internal Medicine

## 2014-08-05 ENCOUNTER — Encounter: Payer: Medicare Other | Attending: General Surgery | Admitting: Dietician

## 2014-08-05 DIAGNOSIS — Z713 Dietary counseling and surveillance: Secondary | ICD-10-CM | POA: Insufficient documentation

## 2014-08-05 DIAGNOSIS — Z6841 Body Mass Index (BMI) 40.0 and over, adult: Secondary | ICD-10-CM | POA: Diagnosis not present

## 2014-08-05 NOTE — Patient Instructions (Addendum)
-  Continue meal plan and start walking!

## 2014-08-05 NOTE — Progress Notes (Signed)
  Follow-up visit: 16 months Post-Operative LAGB Surgery  Medical Nutrition Therapy:  Appt start time: 8022 end time:  1120  Primary concerns today: Post-operative Bariatric Surgery Nutrition Management.  [name] returns today having maintained his weight. He states that he is frustrated with lack of weight loss. However, he has been strictly following his meal plan. He does not exercise.  Surgery date: 04/08/2013  Surgery type: LAGB  Starting weight at [facility]: 360 lbs on 08/29/12  Weight today: 312.5 lbs Weight change: 0 lbs  Total weight lost: 47.5 lbs  Goal: 190s   TANITA BODY COMP RESULTS   04/22/13  05/23/13 07/09/13 10/08/13 11/19/13 01/06/14 03/10/14 05/11/14 06/22/14 08/05/14  BMI (kg/m^2)  52.1  43.1 41.4 40.3 40.3 41.1 41.7 43.4 42.4 42.4  Fat Mass (lbs)  179.0  115.5 109 108.5 108.5 107.5 110 115 126 130.5  Fat Free Mass (lbs)  143.5  202.5 196.5 189 189 195.5 197.5 205 186.5 182  Total Body Water (lbs)  105.0  148 144 138.5 138.5 143 144.5 150 136.5 133     Preferred Learning Style:   No preference indicated   Learning Readiness:  Ready  24-hr recall: B (AM): Premier protein shake (30g); 1-2 cups of decaf coffee with creamer Snk (AM): none  L (2 PM): baked chicken or deli ham or tuna or another protein shake Snk (4-5 PM): Premier protein bar D (PM): cheese, sometimes beans or peas Snk (PM): peanut butter sometimes  Fluid intake: drinking constantly, water, decaf coffee with cream, protein shakes (at least 64 oz)  Estimated total protein intake: 80-90g per patient report  Medications: see list; Requip added Supplementation: taking  Using straws: yes  Drinking while eating: occasionally Hair loss: none Carbonated beverages: none N/V/D/C: none Last Lap-Band fill: plans to get a fill in June (doesn't feel like he needs one)  Recent physical activity:  ADLs  Progress Towards Goal(s):  In progress.    Nutritional Diagnosis:  O'Brien-3.3 Overweight/obesity related to past  poor dietary habits and physical inactivity as evidenced by patient w/ recent LAGB surgery following dietary guidelines for continued weight loss.    Intervention:  Nutrition education provided.   Teaching Method Utilized:  Visual Auditory  Barriers to learning/adherence to lifestyle change: none  Demonstrated degree of understanding via:  Teach Back   Monitoring/Evaluation:  Dietary intake, exercise, lap band fills, and body weight. Follow up in 8 weeks for 18 month post-op visit.

## 2014-08-15 ENCOUNTER — Other Ambulatory Visit: Payer: Self-pay | Admitting: Internal Medicine

## 2014-08-20 ENCOUNTER — Ambulatory Visit (INDEPENDENT_AMBULATORY_CARE_PROVIDER_SITE_OTHER): Payer: Medicare Other | Admitting: Internal Medicine

## 2014-08-20 ENCOUNTER — Encounter: Payer: Self-pay | Admitting: Internal Medicine

## 2014-08-20 VITALS — BP 102/60 | HR 66 | Ht 70.0 in | Wt 321.0 lb

## 2014-08-20 DIAGNOSIS — G4733 Obstructive sleep apnea (adult) (pediatric): Secondary | ICD-10-CM

## 2014-08-20 DIAGNOSIS — G4761 Periodic limb movement disorder: Secondary | ICD-10-CM | POA: Diagnosis not present

## 2014-08-20 MED ORDER — ROPINIROLE HCL 0.25 MG PO TABS: ORAL_TABLET | ORAL | Status: DC

## 2014-08-20 NOTE — Patient Instructions (Signed)
Script sent refilling Requip for leg jerks  Order- DME [facility]- please work with patient on mask fit for comfort to reduce leaks  Please call as needed

## 2014-08-20 NOTE — Progress Notes (Signed)
06/18/14- 79 yoM former smoker referred courtesy of Dr Hopper-currently on CPAP through [facility]. Sleep study 2007 NPSG 10/25/05- severe OSA, AHI 79/ hr, CPAP to 16. Frequent limb jerks with sleep disturbance. Has been using CPAP, full face mask, Advanced. Thinks it causes more frequent colds.  He now needs old machine replaced- buttons sticking. Limb jerks do bother him at night- interested in trying treatment. Bedtime 11PM, latency 15-30 minutes, waking once before up at 10AM. Weight down 100 lbs in past 2 years after diet and lab-band bariatric surgery. HBP, asthma.. Medical problems with PAFib, hx PE- finished anticoagulation due to GI bleed. Bilateral TKR. Hx pericardial window sgy for pericardial effusion.  08/20/14- 68 yoM former smoker followed for OSA, limb jerks,  complicated by AFib, hx PE, bariatric surgery,  PT. WEARS CPAP 16/ Advanced EVERYNIGHT, NO PROBLEMS OR CONCERNS Weight back up to 321 lbs Had to skip CPAP for awhile for facial surgery. Pressure good.  Requip 0.25 x 2 works well for leg jerks.  ROS-see HPI   Negative unless "+" Constitutional:    +weight loss, night sweats, fevers, chills, fatigue, lassitude. HEENT:    headaches, difficulty swallowing, tooth/dental problems, sore throat,       sneezing, itching, ear ache, nasal congestion, post nasal drip, snoring CV:    chest pain, orthopnea, PND, swelling in lower extremities, anasarca,                                  dizziness, +palpitations Resp:   +shortness of breath with exertion or at rest.                productive cough,   +non-productive cough, coughing up of blood.              change in color of mucus.  wheezing.   Skin:    rash or lesions. GI:  No-   heartburn, indigestion, abdominal pain, nausea, vomiting,  GU: d MS:   joint pain, stiffness, decreased range of motion, back pain. Neuro-     nothing unusual Psych:  change in mood or affect.  depression or anxiety.   memory loss.  OBJ- Physical Exam General-  Alert, Oriented, Affect-appropriate, Distress- none acute, +obese Skin- rash-none, lesions- none, excoriation- none Lymphadenopathy- none Head- atraumatic            Eyes- Gross vision intact, PERRLA, conjunctivae and secretions clear            Ears- +Hearing aid            Nose- Clear, no-Septal dev, mucus, polyps, erosion, perforation             Throat- Mallampati III , mucosa clear , drainage- none, tonsils- atrophic Neck- flexible , trachea midline, no stridor , thyroid nl, carotid no bruit Chest - symmetrical excursion , unlabored           Heart/CV- RRR , no murmur , no gallop  , no rub, nl s1 s2                           - JVD- none , edema- none, stasis changes- none, varices- none           Lung- clear to P&A, wheeze- none, cough- none , dullness-none, rub- none           Chest wall-  Abd-  Br/ Gen/ Rectal-  Not done, not indicated Extrem- cyanosis- none, clubbing, none, atrophy- none, strength- nl Neuro- grossly intact to observation

## 2014-08-22 ENCOUNTER — Other Ambulatory Visit: Payer: Self-pay | Admitting: Internal Medicine

## 2014-08-25 ENCOUNTER — Encounter (HOSPITAL_COMMUNITY): Payer: Self-pay | Admitting: Psychiatry

## 2014-08-25 ENCOUNTER — Ambulatory Visit (INDEPENDENT_AMBULATORY_CARE_PROVIDER_SITE_OTHER): Payer: Medicare Other | Admitting: Psychiatry

## 2014-08-25 VITALS — BP 131/81 | HR 74 | Ht 72.0 in | Wt 318.6 lb

## 2014-08-25 DIAGNOSIS — F330 Major depressive disorder, recurrent, mild: Secondary | ICD-10-CM

## 2014-08-25 DIAGNOSIS — F339 Major depressive disorder, recurrent, unspecified: Secondary | ICD-10-CM

## 2014-08-25 MED ORDER — SERTRALINE HCL 100 MG PO TABS: 150.0000 mg | ORAL_TABLET | Freq: Every day | ORAL | Status: DC

## 2014-08-25 MED ORDER — LORAZEPAM 0.5 MG PO TABS: ORAL_TABLET | ORAL | Status: DC

## 2014-08-25 MED ORDER — RISPERIDONE 0.5 MG PO TABS: 0.5000 mg | ORAL_TABLET | Freq: Every day | ORAL | Status: DC

## 2014-08-25 NOTE — Psych (Signed)
[facility] Progress Note  [name] [id number] 69 y.o.  08/25/2014 3:58 PM  Chief Complaint:  Medication management and follow-up.  History of Present Illness:  [name] came for his follow-up appointment.  He is taking his medication and denies any side effects.  He is frustrated because he could not lose his weight.  He recently seen his physician and is wondering despite LAP-BAND surgery he is unable to lose a lot of weight.  He admitted some time poor sleep but overall his mood has been stable.  He denies any irritability, crying spells, feeling of hopelessness or worthlessness.  He is concerned about his mother who lives in nursing home has slowly and gradually deteriorating physically and psychiatrically.  Patient tried to see her on a regular basis.  Patient is compliant with Risperdal, Zoloft, Ativan and denies any side effects, tremors or shakes.  His appetite is okay.  Sometime he feel tired which she believed due to overweight.  Patient denies thinking or using any illegal substances.  Suicidal Ideation: No Plan Formed: No Patient has means to carry out plan: No  Homicidal Ideation: No Plan Formed: No Patient has means to carry out plan: No  Medical History; Patient see Dr. Keturah Barre.  His primary care physician is Dr. [name].  He has multiple health issues.  He has benign prostate hypertrophy, hypertension, gout, obesity, asthma, history of kidney stones, sleep apnea.  Psychosocial History; Patient lives with his wife who is very supportive.  Past Psychiatric History/Hospitalization(s) Patient has one psychiatric hospitalization in 2006 to increased depression and having suicidal thoughts.  He's been seeing in this office since then and fairly stable on his medication. Anxiety: Yes Bipolar Disorder: No Depression: Yes Mania: No Psychosis: No Schizophrenia: No Personality Disorder: No Hospitalization for psychiatric illness: Yes History of  Electroconvulsive Shock Therapy: No Prior Suicide Attempts: No   Review of Systems: Psychiatric: Agitation: No Hallucination: No Depressed Mood: No Insomnia: No Hypersomnia: No Altered Concentration: No Feels Worthless: No Grandiose Ideas: No Belief In Special Powers: No New/Increased Substance Abuse: No Compulsions: No  Neurologic: Headache: No Seizure: No Paresthesias: No   Musculoskeletal: Strength & Muscle Tone: within normal limits Gait & Station: normal Patient leans: N/A  Outpatient Encounter Prescriptions as of 08/25/2014  Medication Sig  . aspirin 81 MG tablet Take 81 mg by mouth daily.  [name] CALCIUM PO Take 500 mg by mouth 3 (three) times daily.   . Cyanocobalamin (VITAMIN B-12) 1000 MCG SUBL Place 1,000 mcg under the tongue every morning.  [name] Sodium (DSS) 100 MG CAPS Take 100 mg by mouth every other day.  [name] ALLERGY RELIEF 10 MG tablet TAKE ONE TABLET BY MOUTH ONCE DAILY  . finasteride (PROSCAR) 5 MG tablet Take 5 mg by mouth daily.   . flecainide (TAMBOCOR) 50 MG tablet TAKE ONE TABLET BY MOUTH EVERY 12 HOURS  . flecainide (TAMBOCOR) 50 MG tablet TAKE ONE TABLET BY MOUTH EVERY 12 HOURS  . fluticasone (FLONASE) 50 MCG/ACT nasal spray Place 2 sprays into both nostrils as needed.   . furosemide (LASIX) 20 MG tablet Take 1 tablet (20 mg total) by mouth daily as needed (as needed for swelling in the legs).  . LORazepam (ATIVAN) 0.5 MG tablet Take 1 tab in the AM and 2 at bed time.  . montelukast (SINGULAIR) 10 MG tablet TAKE ONE TABLET BY MOUTH AT BEDTIME  . Multiple Vitamin (MULTIVITAMIN) tablet Take 1 tablet by mouth daily. Chewable tablet with  iron  . pantoprazole (PROTONIX) 40 MG tablet TAKE ONE TABLET BY MOUTH TWICE DAILY  . potassium chloride (K-DUR,KLOR-CON) 10 MEQ tablet TAKE ONE TABLET BY MOUTH THREE TIMES DAILY  . risperiDONE (RISPERDAL) 0.5 MG tablet Take 1 tablet (0.5 mg total) by mouth at bedtime.  [name] rOPINIRole (REQUIP) 0.25 MG tablet 3  Per  night or as directed for leg jerks  . sertraline (ZOLOFT) 100 MG tablet Take 1.5 tablets (150 mg total) by mouth daily with breakfast.  . Tetrahydrozoline HCl (VISINE OP) Place 2 drops into both eyes daily as needed (dry eyes).  . [DISCONTINUED] LORazepam (ATIVAN) 0.5 MG tablet Take 1 tab in the AM and 2 at bed time.  . [DISCONTINUED] risperiDONE (RISPERDAL) 0.5 MG tablet Take 1 tablet (0.5 mg total) by mouth at bedtime.  . [DISCONTINUED] sertraline (ZOLOFT) 100 MG tablet Take 1.5 tablets (150 mg total) by mouth daily with breakfast.   No facility-administered encounter medications on file as of 08/25/2014.    No results found for this or any previous visit (from the past 2160 hour(s)).  Physical Exam: Consitutional ;  BP 131/81 mmHg  Pulse 74  Ht 6' (1.829 m)  Wt 318 lb 9.6 oz (144.516 kg)  BMI 43.20 kg/m2  Mental Status Examination;  Patient is casually dressed and fairly groomed.  He is cooperative and maintained good eye contact.  His speech is soft, clear and coherent.  He described his mood good.  His affect is appropriate.  His attention and concentration is okay.  He denies any auditory or visual hallucination.  He denies any active or passive suicidal thoughts or homicidal thought.  There were no delusions, paranoia or any obsessive thoughts.  His fund of knowledge is good.  He has no tremors or shakes.  He is alert and oriented 3.  His insight judgment and impulse control is okay.   Established Problem, Stable/Improving (1), Review of Last Therapy Session (1) and Review of Medication Regimen & Side Effects (2)  Assessment: Major depressive disorder, recurrent  Axis III:  Past Medical History  Diagnosis Date  . Benign prostatic hypertrophy     several prostate biopsies neg for cancer.   . Hypertension   . Gout   . Depression   . FH: colonic polyps 2010  . Morbid obesity   . Complication of anesthesia     MORPHINE CAUSES HALLUCINATIONS  . Asthma     childhood asthma   . Numbness     feet / legs  . History of kidney stones   . Kidney cysts   . Sleep apnea     USES C PAP  . Bilateral hip pain   . PAF (paroxysmal atrial fibrillation) with RVR 07/29/2013  . Anemia 07/29/2013    Plan:  Patient is doing better on his current medication.  He has no side effects.  I will continue Zoloft 150 mg daily, Risperdal 0.5 mg at bedtime and Ativan 0.5 mg in the morning and 1 mg at bedtime.  Recommended to call us back if he has any question or any concern.  I will see him again in 3 months.  Alejandrina Raimer T., MD 08/25/2014

## 2014-09-07 NOTE — Assessment & Plan Note (Signed)
Good compliance and control on 16 cwp. One interval off machine for facial surgery,

## 2014-09-07 NOTE — Assessment & Plan Note (Signed)
Good control with Requip 0.25 mg x 2

## 2014-09-10 DIAGNOSIS — Z4651 Encounter for fitting and adjustment of gastric lap band: Secondary | ICD-10-CM | POA: Diagnosis not present

## 2014-09-18 ENCOUNTER — Encounter: Payer: Self-pay | Admitting: Internal Medicine

## 2014-10-14 ENCOUNTER — Encounter: Payer: Medicare Other | Attending: General Surgery | Admitting: Dietician

## 2014-10-14 DIAGNOSIS — Z6841 Body Mass Index (BMI) 40.0 and over, adult: Secondary | ICD-10-CM | POA: Diagnosis not present

## 2014-10-14 DIAGNOSIS — Z713 Dietary counseling and surveillance: Secondary | ICD-10-CM | POA: Diagnosis not present

## 2014-10-14 NOTE — Progress Notes (Signed)
  Follow-up visit: 18 months Post-Operative LAGB Surgery  Medical Nutrition Therapy:  Appt start time: 250 end time:  315  Primary concerns today: Post-operative Bariatric Surgery Nutrition Management.  [name] returns today having gained fluid and lost fat. Got a fill in June but does not know how much. He reports frustration at lack of weight loss as he is following a strict diet. However, he knows he needs to exercise more.   Samples provided and patient instructed on proper use: Celebrate Calcium citrate chewable Gwenlyn Found - qty 19) Lot#: [id number] Exp: 02/2015  Surgery date: 04/08/2013  Surgery type: LAGB  Starting weight at [facility]: 360 lbs on 08/29/12  Weight today: 315.5 lbs Weight change: 3 lbs gain (water weight per [place] body composition scale)  Total weight lost: 45 lbs  Goal: 190s   TANITA BODY COMP RESULTS   04/22/13  05/23/13 07/09/13 10/08/13 11/19/13 01/06/14 03/10/14 05/11/14 06/22/14 08/05/14 10/14/14  BMI (kg/m^2)  52.1  43.1 41.4 40.3 40.3 41.1 41.7 43.4 42.4 42.4 42.8  Fat Mass (lbs)  179.0  115.5 109 108.5 108.5 107.5 110 115 126 130.5 123  Fat Free Mass (lbs)  143.5  202.5 196.5 189 189 195.5 197.5 205 186.5 182 192.5  Total Body Water (lbs)  105.0  148 144 138.5 138.5 143 144.5 150 136.5 133 141     Preferred Learning Style:   No preference indicated   Learning Readiness:  Ready  24-hr recall: B (AM): Premier protein shake (30g); 1-2 cups of decaf coffee with creamer L ( PM): 3 oz broiled chicken or tuna, sometimes fried chicken (21g)  D (PM): chicken or salmon patties with vegetables (21g) Snk (PM): 2 cheese slices (28Z)   Fluid intake: drinking constantly, water, decaf coffee with cream, protein shakes (at least 64 oz)  Estimated total protein intake: 80-90g per patient report  Medications: see list; Risperdol Supplementation: taking  Using straws: yes  Drinking while eating: occasionally Hair loss: none Carbonated beverages: none N/V/D/C: none Last Lap-Band  fill: June 2016  Recent physical activity:  ADLs, renovating house  Progress Towards Goal(s):  In progress.    Nutritional Diagnosis:  Soham-3.3 Overweight/obesity related to past poor dietary habits and physical inactivity as evidenced by patient w/ recent LAGB surgery following dietary guidelines for continued weight loss.    Intervention:  Nutrition education provided.   Teaching Method Utilized:  Visual Auditory  Barriers to learning/adherence to lifestyle change: none  Demonstrated degree of understanding via:  Teach Back   Monitoring/Evaluation:  Dietary intake, exercise, lap band fills, and body weight. Follow up in 3 months for 21 month post-op visit.

## 2014-10-14 NOTE — Patient Instructions (Signed)
-  Begin exercising!  TANITA BODY COMP RESULTS   04/22/13  05/23/13 07/09/13 10/08/13 11/19/13 01/06/14 03/10/14 05/11/14 06/22/14 08/05/14 10/14/14  BMI (kg/m^2)  52.1  43.1 41.4 40.3 40.3 41.1 41.7 43.4 42.4 42.4 42.8  Fat Mass (lbs)  179.0  115.5 109 108.5 108.5 107.5 110 115 126 130.5 123  Fat Free Mass (lbs)  143.5  202.5 196.5 189 189 195.5 197.5 205 186.5 182 192.5  Total Body Water (lbs)  105.0  148 144 138.5 138.5 143 144.5 150 136.5 133 141

## 2014-10-15 ENCOUNTER — Encounter: Payer: Self-pay | Admitting: Internal Medicine

## 2014-10-15 ENCOUNTER — Encounter: Payer: Self-pay | Admitting: Dietician

## 2014-10-16 DIAGNOSIS — L03818 Cellulitis of other sites: Secondary | ICD-10-CM | POA: Diagnosis not present

## 2014-10-16 DIAGNOSIS — T148 Other injury of unspecified body region: Secondary | ICD-10-CM | POA: Diagnosis not present

## 2014-10-20 ENCOUNTER — Ambulatory Visit (INDEPENDENT_AMBULATORY_CARE_PROVIDER_SITE_OTHER): Payer: Medicare Other | Admitting: Psychiatry

## 2014-11-07 ENCOUNTER — Other Ambulatory Visit: Payer: Self-pay | Admitting: Internal Medicine

## 2014-11-09 ENCOUNTER — Other Ambulatory Visit: Payer: Self-pay | Admitting: Emergency Medicine

## 2014-11-09 MED ORDER — POTASSIUM CHLORIDE CRYS ER 10 MEQ PO TBCR: 10.0000 meq | EXTENDED_RELEASE_TABLET | Freq: Three times a day (TID) | ORAL | Status: DC

## 2014-11-12 ENCOUNTER — Other Ambulatory Visit: Payer: Self-pay | Admitting: Internal Medicine

## 2014-11-20 ENCOUNTER — Ambulatory Visit (INDEPENDENT_AMBULATORY_CARE_PROVIDER_SITE_OTHER): Payer: Medicare Other | Admitting: Internal Medicine

## 2014-11-20 ENCOUNTER — Encounter: Payer: Self-pay | Admitting: Internal Medicine

## 2014-11-20 VITALS — BP 116/78 | HR 67 | Ht 70.0 in | Wt 324.0 lb

## 2014-11-20 DIAGNOSIS — G4733 Obstructive sleep apnea (adult) (pediatric): Secondary | ICD-10-CM

## 2014-11-20 DIAGNOSIS — G4761 Periodic limb movement disorder: Secondary | ICD-10-CM | POA: Diagnosis not present

## 2014-11-20 NOTE — Patient Instructions (Signed)
We can continue CPAP 16/ Advanced  Please call if we can help

## 2014-11-20 NOTE — Progress Notes (Signed)
06/18/14- 11 yoM former smoker referred courtesy of Dr Hopper-currently on CPAP through [facility]. Sleep study 2007 NPSG 10/25/05- severe OSA, AHI 79/ hr, CPAP to 16. Frequent limb jerks with sleep disturbance. Has been using CPAP, full face mask, Advanced. Thinks it causes more frequent colds.  He now needs old machine replaced- buttons sticking. Limb jerks do bother him at night- interested in trying treatment. Bedtime 11PM, latency 15-30 minutes, waking once before up at 10AM. Weight down 100 lbs in past 2 years after diet and lab-band bariatric surgery. HBP, asthma.. Medical problems with PAFib, hx PE- finished anticoagulation due to GI bleed. Bilateral TKR. Hx pericardial window sgy for pericardial effusion.  08/20/14- 68 yoM former smoker followed for OSA, limb jerks,  complicated by AFib, hx PE, bariatric surgery,  PT. WEARS CPAP 16/ Advanced EVERYNIGHT, NO PROBLEMS OR CONCERNS Weight back up to 321 lbs Had to skip CPAP for awhile for facial surgery. Pressure good.  Requip 0.25 x 2 works well for leg jerks.  11/20/14- 89 yoM former smoker followed for OSA, limb jerks,  complicated by AFib, hx PE, bariatric surgery, FOLLOWS FOR: Pt states he is wearing CPAP 16/ [facility] nightly for about 8 hours. Pt denies issues with mask, pressure, or machine.  Download confirms great compliance and control CPAP 16/ [facility]. New machine in past year.  ROS-see HPI   Negative unless "+" Constitutional:    +weight loss, night sweats, fevers, chills, fatigue, lassitude. HEENT:    headaches, difficulty swallowing, tooth/dental problems, sore throat,       sneezing, itching, ear ache, nasal congestion, post nasal drip, snoring CV:    chest pain, orthopnea, PND, swelling in lower extremities, anasarca,                                                   dizziness, +palpitations Resp:   +shortness of breath with exertion or at rest.                productive cough,   +non-productive cough, coughing up of blood.              change in color of mucus.  wheezing.   Skin:    rash or lesions. GI:  No-   heartburn, indigestion, abdominal pain, nausea, vomiting,  GU:  MS:   joint pain, stiffness, Neuro-     nothing unusual Psych:  change in mood or affect.  depression or anxiety.   memory loss.  OBJ- Physical Exam General- Alert, Oriented, Affect-appropriate, Distress- none acute, +obese Skin- rash-none, lesions- none, excoriation- none Lymphadenopathy- none Head- atraumatic            Eyes- Gross vision intact, PERRLA, conjunctivae and secretions clear            Ears- +Hearing aid            Nose- Clear, no-Septal dev, mucus, polyps, erosion, perforation             Throat- Mallampati III , mucosa clear , drainage- none, tonsils- atrophic Neck- flexible , trachea midline, no stridor , thyroid nl, carotid no bruit Chest - symmetrical excursion , unlabored           Heart/CV- RRR , no murmur , no gallop  , no rub, nl s1 s2                           -  JVD- none , edema- none, stasis changes- none, varices- none           Lung- clear to P&A, wheeze- none, cough- none , dullness-none, rub- none           Chest wall-  Abd-  Br/ Gen/ Rectal- Not done, not indicated Extrem- cyanosis- none, clubbing, none, atrophy- none, strength- nl Neuro- grossly intact to observation

## 2014-11-22 NOTE — Assessment & Plan Note (Signed)
Good download report. Using CPAP every night and needs to continue.

## 2014-11-22 NOTE — Assessment & Plan Note (Signed)
This is better controlled with less sleep disturbance.

## 2014-11-22 NOTE — Assessment & Plan Note (Signed)
No indication of serious effort to control weight at this time.

## 2014-11-25 ENCOUNTER — Ambulatory Visit (HOSPITAL_COMMUNITY): Payer: Self-pay | Admitting: Psychiatry

## 2014-11-30 ENCOUNTER — Encounter: Payer: Self-pay | Admitting: Internal Medicine

## 2014-12-10 DIAGNOSIS — Z9884 Bariatric surgery status: Secondary | ICD-10-CM | POA: Diagnosis not present

## 2014-12-10 DIAGNOSIS — Z6841 Body Mass Index (BMI) 40.0 and over, adult: Secondary | ICD-10-CM | POA: Diagnosis not present

## 2014-12-15 ENCOUNTER — Encounter (HOSPITAL_COMMUNITY): Payer: Self-pay | Admitting: Psychiatry

## 2014-12-15 ENCOUNTER — Ambulatory Visit (INDEPENDENT_AMBULATORY_CARE_PROVIDER_SITE_OTHER): Payer: Medicare Other | Admitting: Psychiatry

## 2014-12-15 VITALS — BP 132/84 | HR 58 | Ht 72.0 in | Wt 323.2 lb

## 2014-12-15 DIAGNOSIS — F330 Major depressive disorder, recurrent, mild: Secondary | ICD-10-CM | POA: Diagnosis not present

## 2014-12-15 MED ORDER — RISPERIDONE 0.5 MG PO TABS
0.5000 mg | ORAL_TABLET | Freq: Every day | ORAL | Status: DC
Start: 2014-12-15 — End: 2015-03-16

## 2014-12-15 MED ORDER — SERTRALINE HCL 100 MG PO TABS: 150.0000 mg | ORAL_TABLET | Freq: Every day | ORAL | Status: DC

## 2014-12-15 MED ORDER — LORAZEPAM 0.5 MG PO TABS: ORAL_TABLET | ORAL | Status: DC

## 2014-12-15 NOTE — Progress Notes (Signed)
[place] Progress Note  [name] [id number] 69 y.o.  12/15/2014 1:28 PM  Chief Complaint:  Medication management and follow-up.             History of Present Illness:  [name] came for his appointment.  He is taking his medication as prescribed.  He denies any side effects including any tremors, shakes or any EPS.  He sleeping good.  He does not want to change his medication.  Lately his been frustrated because he is unable to lose weight .  He had a LAP-BAND surgery and despite strict on his diet he has not lost significant weight.  He is scheduled to see Dr. Redmond Pulling in October and we will discuss more in detail.  He is sleeping good.  He denies any feeling of hopelessness or worthlessness.  His appetite is okay.  She denies drinking or using any illegal substances.  Lately he's been attending webinar to manage money and he is pleased with the success and good results.  His primary care physician Dr. Linna [name] is retiring but so far he has not decided who he will choose for his new primary care physician.  Patient lives with his wife who is very supportive.  Suicidal Ideation: No Plan Formed: No Patient has means to carry out plan: No  Homicidal Ideation: No Plan Formed: No Patient has means to carry out plan: No  Review of Systems: Psychiatric: Agitation: No Hallucination: No Depressed Mood: No Insomnia: No Hypersomnia: No Altered Concentration: No Feels Worthless: No Grandiose Ideas: No Belief In Special Powers: No New/Increased Substance Abuse: No Compulsions: No  Neurologic: Headache: No Seizure: No Paresthesias: No  Past Medical History  Diagnosis Date  . Benign prostatic hypertrophy     several prostate biopsies neg for cancer.   . Hypertension   . Gout   . Depression   . FH: colonic polyps 2010  . Morbid obesity   . Complication of anesthesia     MORPHINE CAUSES HALLUCINATIONS  . Asthma     childhood asthma  . Numbness     feet /  legs  . History of kidney stones   . Kidney cysts   . Sleep apnea     USES C PAP  . Bilateral hip pain   . PAF (paroxysmal atrial fibrillation) with RVR 07/29/2013  . Anemia 07/29/2013  Patient see Dr. Linna [name].   Outpatient Encounter Prescriptions as of 12/15/2014  Medication Sig  . aspirin 81 MG tablet Take 81 mg by mouth daily.  [name] CALCIUM PO Take 500 mg by mouth 3 (three) times daily.   . Cyanocobalamin (VITAMIN B-12) 1000 MCG SUBL Place 1,000 mcg under the tongue every morning.  [name] Sodium (DSS) 100 MG CAPS Take 100 mg by mouth every other day.  [name] ALLERGY RELIEF 10 MG tablet TAKE ONE TABLET BY MOUTH ONCE DAILY  . finasteride (PROSCAR) 5 MG tablet Take 5 mg by mouth daily.   . flecainide (TAMBOCOR) 50 MG tablet TAKE ONE TABLET BY MOUTH EVERY 12 HOURS  . fluticasone (FLONASE) 50 MCG/ACT nasal spray Place 2 sprays into both nostrils as needed.   . furosemide (LASIX) 20 MG tablet TAKE ONE TABLET BY MOUTH ONCE DAILY AS NEEDED FOR  SWELLING  IN  THE  LEGS  . LORazepam (ATIVAN) 0.5 MG tablet Take 1 tab in the AM and 2 at bed time.  . montelukast (SINGULAIR) 10 MG tablet TAKE ONE TABLET BY MOUTH AT BEDTIME  .  Multiple Vitamin (MULTIVITAMIN) tablet Take 1 tablet by mouth daily. Chewable tablet with iron  . pantoprazole (PROTONIX) 40 MG tablet TAKE ONE TABLET BY MOUTH TWICE DAILY  . potassium chloride (K-DUR,KLOR-CON) 10 MEQ tablet Take 1 tablet (10 mEq total) by mouth 3 (three) times daily.  . risperiDONE (RISPERDAL) 0.5 MG tablet Take 1 tablet (0.5 mg total) by mouth at bedtime.  [name] rOPINIRole (REQUIP) 0.25 MG tablet 3  Per night or as directed for leg jerks  . sertraline (ZOLOFT) 100 MG tablet Take 1.5 tablets (150 mg total) by mouth daily with breakfast.  . Tetrahydrozoline HCl (VISINE OP) Place 2 drops into both eyes daily as needed (dry eyes).  . [DISCONTINUED] LORazepam (ATIVAN) 0.5 MG tablet Take 1 tab in the AM and 2 at bed time.  . [DISCONTINUED] risperiDONE (RISPERDAL) 0.5 MG  tablet Take 1 tablet (0.5 mg total) by mouth at bedtime.  . [DISCONTINUED] sertraline (ZOLOFT) 100 MG tablet Take 1.5 tablets (150 mg total) by mouth daily with breakfast.   No facility-administered encounter medications on file as of 12/15/2014.    Past Psychiatric History/Hospitalization(s) Patient has one psychiatric admission in 2006 due to increased depression and having suicidal thoughts.  Since then patient has been seen in this office and stable on his psychiatric medication. Anxiety: Yes Bipolar Disorder: No Depression: Yes Mania: No Psychosis: No Schizophrenia: No Personality Disorder: No Hospitalization for psychiatric illness: Yes History of Electroconvulsive Shock Therapy: No Prior Suicide Attempts: No  Physical Exam: Constitutional:  BP 132/84 mmHg  Pulse 58  Ht 6' (1.829 m)  Wt 323 lb 3.2 oz (146.603 kg)  BMI 43.82 kg/m2  Musculoskeletal: Strength & Muscle Tone: within normal limits Gait & Station: normal Patient leans: N/A  Mental Status Examination;  Patient is casually dressed and fairly groomed.  He is pleasant and cooperative.  He maintained good eye contact.  His speech is soft, clear and coherent.  He described his mood good and his affect is improved from the past.  His thought process is logical linear and goal directed.  There were no flight if ideas of any loose association.  He denies any active or passive suicidal thoughts and homicidal thoughts.  His fund of knowledge is adequate.  There were no paranoia or delusions present at this time.  His attention concentration is fair.  He is alert and oriented x3.  His insight judgment and impulse control is okay.   Established Problem, Stable/Improving (1), Review of Psycho-Social Stressors (1), Review of Last Therapy Session (1) and Review of Medication Regimen & Side Effects (2)  Assessment: Axis I: Maj. depressive disorder, recurrent  Axis II: Deferred  Axis III:  Past Medical History  Diagnosis  Date  . Benign prostatic hypertrophy     several prostate biopsies neg for cancer.   . Hypertension   . Gout   . Depression   . FH: colonic polyps 2010  . Morbid obesity   . Complication of anesthesia     MORPHINE CAUSES HALLUCINATIONS  . Asthma     childhood asthma  . Numbness     feet / legs  . History of kidney stones   . Kidney cysts   . Sleep apnea     USES C PAP  . Bilateral hip pain   . PAF (paroxysmal atrial fibrillation) with RVR 07/29/2013  . Anemia 07/29/2013   Plan:  Patient is stable on his current medication.  Discusses weight-loss shoe and encouraged to keep appointment with Dr. Redmond Pulling .  At this time he wants to continue his current psychiatric medication.  He has no side effects.  I will continue Zoloft 150 mg daily, Risperdal 0.5 mg at bedtime and Ativan 0.5 mg in the morning and 1 mg at bedtime.  Recommended to call us back if he has any question or any concern if he feel worsening of the symptoms.  I will see him again in 3 months.  ARFEEN,SYED T., MD 12/15/2014

## 2014-12-17 DIAGNOSIS — Z125 Encounter for screening for malignant neoplasm of prostate: Secondary | ICD-10-CM | POA: Diagnosis not present

## 2014-12-17 DIAGNOSIS — N401 Enlarged prostate with lower urinary tract symptoms: Secondary | ICD-10-CM | POA: Diagnosis not present

## 2014-12-18 LAB — PSA: PSA: 0.54

## 2014-12-24 DIAGNOSIS — N401 Enlarged prostate with lower urinary tract symptoms: Secondary | ICD-10-CM | POA: Diagnosis not present

## 2014-12-24 DIAGNOSIS — N5201 Erectile dysfunction due to arterial insufficiency: Secondary | ICD-10-CM | POA: Diagnosis not present

## 2014-12-24 DIAGNOSIS — Q6102 Congenital multiple renal cysts: Secondary | ICD-10-CM | POA: Diagnosis not present

## 2014-12-24 DIAGNOSIS — N200 Calculus of kidney: Secondary | ICD-10-CM | POA: Diagnosis not present

## 2014-12-30 ENCOUNTER — Other Ambulatory Visit: Payer: Self-pay | Admitting: Internal Medicine

## 2015-01-01 NOTE — Telephone Encounter (Signed)
Pts last OV 2/16, please advise

## 2015-01-01 NOTE — Telephone Encounter (Signed)
This is Rxed by Cardiology; not I. Thanks, [employer]

## 2015-01-05 ENCOUNTER — Other Ambulatory Visit: Payer: Self-pay | Admitting: Internal Medicine

## 2015-01-05 MED ORDER — FLECAINIDE ACETATE 50 MG PO TABS: 50.0000 mg | ORAL_TABLET | Freq: Two times a day (BID) | ORAL | Status: DC

## 2015-01-05 NOTE — Telephone Encounter (Signed)
°  1. Which medications need to be refilled?Flecainide-please call today  2. Which pharmacy is medication to be sent to?[facility]  3. Do they need a 30 day or 90 day supply? 90 and refills  4. Would they like a call back once the medication has been sent to the pharmacy? yes

## 2015-01-05 NOTE — Telephone Encounter (Signed)
E-sent medication for 1 refill Patient aware

## 2015-01-15 DIAGNOSIS — Z9884 Bariatric surgery status: Secondary | ICD-10-CM | POA: Diagnosis not present

## 2015-02-02 ENCOUNTER — Other Ambulatory Visit: Payer: Self-pay | Admitting: Internal Medicine

## 2015-02-03 DIAGNOSIS — M5442 Lumbago with sciatica, left side: Secondary | ICD-10-CM | POA: Diagnosis not present

## 2015-02-04 ENCOUNTER — Other Ambulatory Visit: Payer: Self-pay | Admitting: Internal Medicine

## 2015-02-09 ENCOUNTER — Encounter: Payer: Self-pay | Admitting: Internal Medicine

## 2015-02-15 DIAGNOSIS — G8929 Other chronic pain: Secondary | ICD-10-CM | POA: Diagnosis not present

## 2015-02-15 DIAGNOSIS — M5442 Lumbago with sciatica, left side: Secondary | ICD-10-CM | POA: Diagnosis not present

## 2015-02-16 ENCOUNTER — Encounter: Payer: Medicare Other | Attending: Internal Medicine | Admitting: Dietician

## 2015-02-16 ENCOUNTER — Encounter: Payer: Self-pay | Admitting: Dietician

## 2015-02-16 DIAGNOSIS — Z713 Dietary counseling and surveillance: Secondary | ICD-10-CM | POA: Diagnosis not present

## 2015-02-16 DIAGNOSIS — Z6841 Body Mass Index (BMI) 40.0 and over, adult: Secondary | ICD-10-CM | POA: Diagnosis not present

## 2015-02-16 NOTE — Progress Notes (Signed)
  Follow-up visit: 2 years Post-Operative LAGB Surgery  Medical Nutrition Therapy:  Appt start time: 1115 end time:  1140  Primary concerns today: Post-operative Bariatric Surgery Nutrition Management.  [name] returns today having gained fluid and lost fat. Got a fill in June but does not know how much. He reports frustration at lack of weight loss as he is following a strict diet. However, he knows he needs to exercise more.   Has gained more fluid. Weighs himself daily and does not notice any significant fluctuations from day to day. Takes lasix daily. Feels less motivated to be healthy because he thinks he may not have any grandkids. Has a psychiatrist that he has been working with for 10 years; he sees him every 3 months for medications. He reports that Dr. Redmond Pulling plans to refer him to another psychologist. He feels like this will not be beneficial for him. He was motivated until he had an issue with his heart.  Has a "collapsed disk" and has an appointment for this next week. Feels like his body tells him when to stop eating. Wife sometimes brings foods into the home that are tempting for him.    Surgery date: 04/08/2013  Surgery type: LAGB  [facility]: 360 lbs on 08/29/12  Weight today: 315.5 lbs Weight change: 3 lbs gain (water weight per [place] body composition scale)  Total weight lost: 45 lbs  Goal: 190s   [place] BODY COMP RESULTS   04/22/13  05/23/13 07/09/13 10/08/13 11/19/13 01/06/14 03/10/14 05/11/14 06/22/14 08/05/14 10/14/14 02/16/15  BMI (kg/m^2)  52.1  43.1 41.4 40.3 40.3 41.1 41.7 43.4 42.4 42.4 42.8 44.4  Fat Mass (lbs)  179.0  115.5 109 108.5 108.5 107.5 110 115 126 130.5 123 122.5  Fat Free Mass (lbs)  143.5  202.5 196.5 189 189 195.5 197.5 205 186.5 182 192.5 204.5  Total Body Water (lbs)  105.0  148 144 138.5 138.5 143 144.5 150 136.5 133 141 149.5     Preferred Learning Style:   No preference indicated   Learning Readiness:  Ready  24-hr recall: B (AM):  Premier protein shake (30g); 1-2 cups of decaf coffee with creamer L ( PM): 2-3 oz deli ham (14-21g)  D (PM): [employer] country style steak with okra and butter beans or peas and corn OR omelet OR steak and eggs (21g) Snk (PM): 2 cheese slices, sometimes another protein shakes (14g)   Fluid intake: drinking constantly, water, decaf coffee with cream, protein shakes (at least 64 oz)  Estimated total protein intake: 80-90g per patient report  Medications: see list Supplementation: taking  Using straws: yes  Drinking while eating: none Hair loss: none Carbonated beverages: none N/V/D/C: none Last Lap-Band fill: June 2016; none recently  Recent physical activity:  ADLs, renovating house  Progress Towards Goal(s):  In progress.    Nutritional Diagnosis:  [place]-3.3 Overweight/obesity related to past poor dietary habits and physical inactivity as evidenced by patient w/ recent LAGB surgery following dietary guidelines for continued weight loss.    Intervention:  Nutrition education provided.   Teaching Method Utilized:  Visual Auditory  Barriers to learning/adherence to lifestyle change: none  Demonstrated degree of understanding via:  Teach Back   Monitoring/Evaluation:  Dietary intake, exercise, lap band fills, and body weight. Follow up prn.

## 2015-02-16 NOTE — Patient Instructions (Addendum)
-  Work on reducing sodium intake (added salt and restaurant food)  TANITA BODY COMP RESULTS   04/22/13  05/23/13 07/09/13 10/08/13 11/19/13 01/06/14 03/10/14 05/11/14 06/22/14 08/05/14 10/14/14 02/16/15  BMI (kg/m^2)  52.1  43.1 41.4 40.3 40.3 41.1 41.7 43.4 42.4 42.4 42.8 44.4  Fat Mass (lbs)  179.0  115.5 109 108.5 108.5 107.5 110 115 126 130.5 123 122.5  Fat Free Mass (lbs)  143.5  202.5 196.5 189 189 195.5 197.5 205 186.5 182 192.5 204.5  Total Body Water (lbs)  105.0  148 144 138.5 138.5 143 144.5 150 136.5 133 141 149.5

## 2015-02-22 DIAGNOSIS — M5442 Lumbago with sciatica, left side: Secondary | ICD-10-CM | POA: Diagnosis not present

## 2015-03-03 ENCOUNTER — Ambulatory Visit: Payer: Self-pay | Admitting: Internal Medicine

## 2015-03-09 ENCOUNTER — Other Ambulatory Visit: Payer: Self-pay | Admitting: Internal Medicine

## 2015-03-12 DIAGNOSIS — Z471 Aftercare following joint replacement surgery: Secondary | ICD-10-CM | POA: Diagnosis not present

## 2015-03-12 DIAGNOSIS — Z96653 Presence of artificial knee joint, bilateral: Secondary | ICD-10-CM | POA: Diagnosis not present

## 2015-03-15 ENCOUNTER — Ambulatory Visit: Payer: Self-pay | Admitting: Internal Medicine

## 2015-03-16 ENCOUNTER — Ambulatory Visit (INDEPENDENT_AMBULATORY_CARE_PROVIDER_SITE_OTHER): Payer: Medicare Other | Admitting: Psychiatry

## 2015-03-16 ENCOUNTER — Telehealth: Payer: Self-pay

## 2015-03-16 ENCOUNTER — Encounter (HOSPITAL_COMMUNITY): Payer: Self-pay | Admitting: Psychiatry

## 2015-03-16 VITALS — BP 132/80 | HR 67 | Ht 72.0 in | Wt 331.0 lb

## 2015-03-16 DIAGNOSIS — F330 Major depressive disorder, recurrent, mild: Secondary | ICD-10-CM | POA: Diagnosis not present

## 2015-03-16 MED ORDER — RISPERIDONE 0.5 MG PO TABS: 0.5000 mg | ORAL_TABLET | Freq: Every day | ORAL | Status: DC

## 2015-03-16 MED ORDER — LORAZEPAM 0.5 MG PO TABS: ORAL_TABLET | ORAL | Status: DC

## 2015-03-16 MED ORDER — SERTRALINE HCL 100 MG PO TABS: 150.0000 mg | ORAL_TABLET | Freq: Every day | ORAL | Status: DC

## 2015-03-16 NOTE — Telephone Encounter (Signed)
Left Voice Mail for pt to call back.   RE: Flu Vaccine for 2016  

## 2015-03-16 NOTE — Progress Notes (Signed)
[place] Progress Note  [name] [id number] 69 y.o.  03/16/2015 1:40 PM  Chief Complaint:  Medication management and follow-up.             History of Present Illness:  [name] came for his appointment.  He is compliant with his medication and reported no side effects.  He is not losing his weight despite having lap band surgery .  He admitted some time lack of motivation to lose weight because he is concerned about his health issues.  Last time he lost weight and he developed cardiac problems .  He recently seen his surgeon but there has been no new changes.  Patient like to have a concealed permit gun as he enjoys shooting at shooting range.  On the Christmas he is planning to go with his son for shooting range.  Patient used to have a gun however 10 years ago when he was admitted to behavioral [place] he has surrender his gun.  Since his foster last psychiatric admission he has been taking his medication and coming to the appointment on the [place] basis.  He feel current psychiatric medication is helping him a lot.  He denies any irritability, anger, severe mood swing.  He is willing to take classes before he apply for permit.  He denies any agitation, anger, crying spells or any feeling of hopelessness or worthlessness.  He does not feel any anhedonia.  He is concerned about his primary care physician Dr. Linna [name] who retired and he is not sure who is his new primary care physician.  Patient lives with his wife is very supportive.  Has appetite is okay.  His vitals are unchanged from the past.  Suicidal Ideation: No Plan Formed: No Patient has means to carry out plan: No  Homicidal Ideation: No Plan Formed: No Patient has means to carry out plan: No  Review of Systems: Psychiatric: Agitation: No Hallucination: No Depressed Mood: No Insomnia: No Hypersomnia: No Altered Concentration: No Feels Worthless: No Grandiose Ideas: No Belief In Special Powers:  No New/Increased Substance Abuse: No Compulsions: No  Neurologic: Headache: No Seizure: No Paresthesias: No  Past Medical History  Diagnosis Date  . Benign prostatic hypertrophy     several prostate biopsies neg for cancer.   . Hypertension   . Gout   . Depression   . FH: colonic polyps 2010  . Morbid obesity ([place])   . Complication of anesthesia     MORPHINE CAUSES HALLUCINATIONS  . Asthma     childhood asthma  . Numbness     feet / legs  . History of kidney stones   . Kidney cysts   . Sleep apnea     USES C PAP  . Bilateral hip pain   . PAF (paroxysmal atrial fibrillation) with RVR 07/29/2013  . Anemia 07/29/2013    Outpatient Encounter Prescriptions as of 03/16/2015  Medication Sig  . aspirin 81 MG tablet Take 81 mg by mouth daily.  [name] CALCIUM PO Take 500 mg by mouth 3 (three) times daily.   . Cyanocobalamin (VITAMIN B-12) 1000 MCG SUBL Place 1,000 mcg under the tongue every morning.  [name] Sodium (DSS) 100 MG CAPS Take 100 mg by mouth every other day.  [name] ALLERGY RELIEF 10 MG tablet TAKE ONE TABLET BY MOUTH ONCE DAILY  . finasteride (PROSCAR) 5 MG tablet Take 5 mg by mouth daily.   . flecainide (TAMBOCOR) 50 MG tablet Take 1 tablet (50 mg  total) by mouth every 12 (twelve) hours.  . fluticasone (FLONASE) 50 MCG/ACT nasal spray Place 2 sprays into both nostrils as needed.   . furosemide (LASIX) 20 MG tablet TAKE ONE TABLET BY MOUTH ONCE DAILY AS NEEDED FOR  SWELLING  IN  THE  LEGS  . LORazepam (ATIVAN) 0.5 MG tablet Take 1 tab in the AM and 2 at bed time.  . montelukast (SINGULAIR) 10 MG tablet TAKE ONE TABLET BY MOUTH AT BEDTIME  . Multiple Vitamin (MULTIVITAMIN) tablet Take 1 tablet by mouth daily. Chewable tablet with iron  . pantoprazole (PROTONIX) 40 MG tablet TAKE ONE TABLET BY MOUTH TWICE DAILY  . potassium chloride (K-DUR,KLOR-CON) 10 MEQ tablet TAKE ONE TABLET BY MOUTH THREE TIMES DAILY  . risperiDONE (RISPERDAL) 0.5 MG tablet Take 1 tablet (0.5 mg  total) by mouth at bedtime.  [name] rOPINIRole (REQUIP) 0.25 MG tablet 3  Per night or as directed for leg jerks  . sertraline (ZOLOFT) 100 MG tablet Take 1.5 tablets (150 mg total) by mouth daily with breakfast.  . Tetrahydrozoline HCl (VISINE OP) Place 2 drops into both eyes daily as needed (dry eyes).  . [DISCONTINUED] LORazepam (ATIVAN) 0.5 MG tablet Take 1 tab in the AM and 2 at bed time.  . [DISCONTINUED] risperiDONE (RISPERDAL) 0.5 MG tablet Take 1 tablet (0.5 mg total) by mouth at bedtime.  . [DISCONTINUED] sertraline (ZOLOFT) 100 MG tablet Take 1.5 tablets (150 mg total) by mouth daily with breakfast.   No facility-administered encounter medications on file as of 03/16/2015.    Past Psychiatric History/Hospitalization(s) Patient has one psychiatric admission in 2006 due to increased depression and having suicidal thoughts.  Since then patient has been seen in this office and stable on his psychiatric medication. Anxiety: Yes Bipolar Disorder: No Depression: Yes Mania: No Psychosis: No Schizophrenia: No Personality Disorder: No Hospitalization for psychiatric illness: Yes History of Electroconvulsive Shock Therapy: No Prior Suicide Attempts: No  Physical Exam: Constitutional:  BP 132/80 mmHg  Pulse 67  Ht 6' (1.829 m)  Wt 331 lb (150.141 kg)  BMI 44.88 kg/m2  Musculoskeletal: Strength & Muscle Tone: within normal limits Gait & Station: normal Patient leans: N/A  Mental Status Examination;  Patient is casually dressed and fairly groomed.  He is pleasant, cooperative and maintained good eye contact.  His speech is soft, clear and coherent.  He described his mood good and his affect is improved from the past.  His thought process is logical linear and goal directed.  There were no flight if ideas of any loose association.  He denies any active or passive suicidal thoughts and homicidal thoughts.  His fund of knowledge is adequate.  There were no paranoia or delusions present at  this time.  His attention concentration is fair.  He is alert and oriented x3.  His insight judgment and impulse control is okay.   Established Problem, Stable/Improving (1), Review of Psycho-Social Stressors (1), Review of Last Therapy Session (1) and Review of Medication Regimen & Side Effects (2)  Assessment: Axis I: Maj. depressive disorder, recurrent  Axis II: Deferred  Axis III:  Past Medical History  Diagnosis Date  . Benign prostatic hypertrophy     several prostate biopsies neg for cancer.   . Hypertension   . Gout   . Depression   . FH: colonic polyps 2010  . Morbid obesity ([place])   . Complication of anesthesia     MORPHINE CAUSES HALLUCINATIONS  . Asthma     childhood  asthma  . Numbness     feet / legs  . History of kidney stones   . Kidney cysts   . Sleep apnea     USES C PAP  . Bilateral hip pain   . PAF (paroxysmal atrial fibrillation) with RVR 07/29/2013  . Anemia 07/29/2013   Plan:  Discuss weight loss and motivation to lose weight.  Patient does not want to lose weight because he scared as last time he lost weight he developed significant cardiac issues.  Reassurance given.  Recommended to watch his calorie intake rather than doing heavy exercise or walking.  Patient promised he will try to watch his calorie intake.  He liked to go with his son on a shooting range after Christmas.  Discussed risk and benefits of medication.  Patient like to continue his current psychiatric medication.  He has no side effects.  I will continue Zoloft 150 mg daily, Risperdal 0.5 mg at bedtime and Ativan 0.5 mg in the morning and 1 mg at bedtime.  Recommended to call us back if he has any question or any concern if he feel worsening of the symptoms.  I will see him again in 3 months.  Lazara Grieser T., MD 03/16/2015

## 2015-03-19 ENCOUNTER — Other Ambulatory Visit (INDEPENDENT_AMBULATORY_CARE_PROVIDER_SITE_OTHER): Payer: Medicare Other

## 2015-03-19 ENCOUNTER — Encounter: Payer: Self-pay | Admitting: Internal Medicine

## 2015-03-19 ENCOUNTER — Ambulatory Visit (INDEPENDENT_AMBULATORY_CARE_PROVIDER_SITE_OTHER): Payer: Medicare Other | Admitting: Internal Medicine

## 2015-03-19 VITALS — BP 130/78 | HR 79 | Temp 98.9°F | Resp 20 | Ht 72.0 in | Wt 327.8 lb

## 2015-03-19 DIAGNOSIS — E876 Hypokalemia: Secondary | ICD-10-CM

## 2015-03-19 DIAGNOSIS — I10 Essential (primary) hypertension: Secondary | ICD-10-CM | POA: Diagnosis not present

## 2015-03-19 DIAGNOSIS — F329 Major depressive disorder, single episode, unspecified: Secondary | ICD-10-CM | POA: Diagnosis not present

## 2015-03-19 DIAGNOSIS — M5489 Other dorsalgia: Secondary | ICD-10-CM | POA: Diagnosis not present

## 2015-03-19 DIAGNOSIS — M545 Low back pain: Secondary | ICD-10-CM | POA: Diagnosis not present

## 2015-03-19 DIAGNOSIS — Z9884 Bariatric surgery status: Secondary | ICD-10-CM | POA: Diagnosis not present

## 2015-03-19 LAB — BASIC METABOLIC PANEL
BUN: 23 mg/dL (ref 6–23)
CALCIUM: 9 mg/dL (ref 8.4–10.5)
CHLORIDE: 103 meq/L (ref 96–112)
CO2: 31 meq/L (ref 19–32)
CREATININE: 1.37 mg/dL (ref 0.40–1.50)
GFR: 54.72 mL/min — ABNORMAL LOW (ref >60.00)
GLUCOSE: 99 mg/dL (ref 70–99)
Potassium: 4 mEq/L (ref 3.5–5.1)
Sodium: 141 mEq/L (ref 135–145)

## 2015-03-19 MED ORDER — POTASSIUM CHLORIDE CRYS ER 10 MEQ PO TBCR: 10.0000 meq | EXTENDED_RELEASE_TABLET | Freq: Three times a day (TID) | ORAL | Status: DC

## 2015-03-19 NOTE — Progress Notes (Signed)
   Subjective:    Patient ID: [name], male    DOB: 1946-02-21, 69 y.o.   MRN: [id number]  HPI  He is here for refill of his potassium. This was started 3 times a day during his hospitalization for pericarditis in April 2015. He has continued it since as he's had no injstruction otherwise. His diuretic is low-dose furosemide 20 mg daily. He is on no steroids.  He is on a modified heart healthy diet including some red meat, fried foods, & salt in his diet. 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  He is compliant with his CPAP machine.  Other than chronic edema he has no active cardiopulmonary symptoms.  Review of Systems  Chest pain, palpitations, tachycardia, exertional dyspnea, paroxysmal nocturnal dyspnea, or claudication  are absent.    Objective:   Physical Exam  Pertinent or positive findings include: BMI is 44.44. He has bilateral ptosis. Abdomen is massive and protuberant. He has 1+ edema at the sock line. Knees are fusiformly enlarged. He has bilateral crepitus in the knees.  General appearance :adequately nourished; in no distress.  Eyes: No conjunctival inflammation or scleral icterus is present.  Heart:  Normal rate and regular rhythm. S1 and S2 normal without gallop, murmur, click, rub or other extra sounds    Lungs:Chest clear to auscultation; no wheezes, rhonchi,rales ,or rubs present.No increased work of breathing.   Abdomen: bowel sounds normal, soft and non-tender without masses, organomegaly or hernias noted.  No guarding or rebound.   Vascular : all pulses equal ; no bruits present.  Skin:Warm & dry.  Intact without suspicious lesions or rashes ; no tenting or jaundice   Lymphatic: No lymphadenopathy is noted about the head, neck, axilla.   Neuro: Strength, tone & DTRs normal.      Assessment & Plan:  #1 hypokalemia without definitive etiology  Plan:BMET; fasting cortisol indicated if the hypokalemia is  significant.

## 2015-03-19 NOTE — Patient Instructions (Signed)
  Your next office appointment will be determined based upon review of your pending labs. Those written interpretation of the lab results and instructions will be transmitted to you by My Chart  Critical results will be called.   Followup as needed for any active or acute issue. Please report any significant change in your symptoms. 

## 2015-03-19 NOTE — Progress Notes (Signed)
Pre visit review using our clinic review tool, if applicable. No additional management support is needed unless otherwise documented below in the visit note. 

## 2015-03-20 ENCOUNTER — Other Ambulatory Visit: Payer: Self-pay | Admitting: Internal Medicine

## 2015-03-20 DIAGNOSIS — E876 Hypokalemia: Secondary | ICD-10-CM

## 2015-04-09 ENCOUNTER — Telehealth: Payer: Self-pay | Admitting: Internal Medicine

## 2015-04-09 NOTE — Telephone Encounter (Signed)
Patient Name: CHISOM RICHESIN [facility]  DOB: May 22, 1945    Initial Comment Caller States he has headache, sore throat, runny nose, sinuses stopping up, body aches. wants to know what he can take what wont interfere with his reg. meds.    Nurse Assessment  Nurse: Mallie Mussel, RN, Alveta Heimlich Date/Time Eilene Ghazi Time): 04/09/2015 2:16:42 PM  Confirm and document reason for call. If symptomatic, describe symptoms. ---Caller States he has headache, sore throat, runny nose, sinuses stopping up, body aches. for the last couple days. He rates his pain as 5-6 on 0-10 scale. Denies pain, redness and swelling in sinus area. He does feel warm to touch like he may have a low grade fever. He has some sinus drainage going into the back of his throat. His throat hurts, but only about a 3.  Has the patient traveled out of the country within the last 30 days? ---No  Does the patient have any new or worsening symptoms? ---Yes  Will a triage be completed? ---Yes  Related visit to physician within the last 2 weeks? ---No  Does the PT have any chronic conditions? (i.e. diabetes, asthma, etc.) ---Yes  List chronic conditions. ---Depression, RLS - legs jerking at night, Allergies - takes Loratadine,  Is this a behavioral health or substance abuse call? ---No     Guidelines    Guideline Title Affirmed Question Affirmed Notes  Common Cold Cold with no complications (all triage questions negative)    Final Disposition User   [place], RN, Alveta Heimlich    Disagree/Comply: Leta Baptist

## 2015-04-12 ENCOUNTER — Encounter: Payer: Self-pay | Admitting: Internal Medicine

## 2015-04-13 ENCOUNTER — Ambulatory Visit: Payer: Self-pay | Admitting: Internal Medicine

## 2015-04-27 DIAGNOSIS — M5442 Lumbago with sciatica, left side: Secondary | ICD-10-CM | POA: Diagnosis not present

## 2015-04-29 ENCOUNTER — Encounter: Payer: Self-pay | Admitting: Internal Medicine

## 2015-04-29 ENCOUNTER — Ambulatory Visit (INDEPENDENT_AMBULATORY_CARE_PROVIDER_SITE_OTHER): Payer: Medicare Other | Admitting: Internal Medicine

## 2015-04-29 VITALS — BP 138/90 | HR 58 | Ht 72.0 in | Wt 337.4 lb

## 2015-04-29 DIAGNOSIS — I319 Disease of pericardium, unspecified: Secondary | ICD-10-CM

## 2015-04-29 DIAGNOSIS — I3139 Other pericardial effusion (noninflammatory): Secondary | ICD-10-CM

## 2015-04-29 DIAGNOSIS — G4733 Obstructive sleep apnea (adult) (pediatric): Secondary | ICD-10-CM

## 2015-04-29 DIAGNOSIS — I10 Essential (primary) hypertension: Secondary | ICD-10-CM

## 2015-04-29 DIAGNOSIS — I480 Paroxysmal atrial fibrillation: Secondary | ICD-10-CM | POA: Diagnosis not present

## 2015-04-29 DIAGNOSIS — I313 Pericardial effusion (noninflammatory): Secondary | ICD-10-CM

## 2015-04-29 NOTE — Patient Instructions (Signed)
Dr Debara Pickett has made no changes to your current medications or treatment plan.  Your physician recommends that you schedule a follow-up appointment in 1 year. You will receive a reminder letter in the mail two months in advance. If you don't receive a letter, please call our office to schedule the follow-up appointment.  If you need a refill on your cardiac medications before your next appointment, please call your pharmacy.

## 2015-05-02 NOTE — Progress Notes (Signed)
OFFICE NOTE  Chief Complaint:  Mild shortness of breath with exercise and leg swelling  Primary Care Physician: [name], MD  HPI:  [name] is a 70 y/o male with a history of bariatric surgery, one year ago, lost about 85 pounds before and after surgery. Also has a history of hypertension and sleep apnea. He has been admitted twice in the last month with an unusual presentation with multiple medical problems, including pulmonary embolus, moderate to large pericardial effusion, new onset a-fib and GI bleed. His PE was diagnosed by his PCP through outpatient w/u and he was placed on Xarelto. His first hospital admission was from 4/15-4/24. At that time his CC was chest pain. W/u suggested a diagnosis of pericarditis. An Echocardiogram was done, which showed moderate to large pericardial effusion. Meanwhile the night of 4/16 he went in to afib with RVR, probably secondary to PE and was transferred to step down and started on cardizem drip. He converted to sinus the same night , cardizem discontinued and he was resumed on metoprolol. CT surgery was consulted for pericardial window. He underwent pericardial window on 4/17 and hemorrhagic fluid was drained and was sent for analysis. Gram stain and cultures were negative. He was also started on colchicine for his pericarditis. The evening of 4/20 pt went back in to afib with RVR and had bloody bowel movement. His xarelto was discontinued and he was put on cardizem gtt. Over the next 24 hours his hemoglobin dropped to 7.8 and he received 2 units of prbc transfusion. His repeat H&H improved. Dr Henrene Pastor was consulted from GI on 4/20 and he underwent endoscopy on 4/21 . He was found to have superficial ulcers in the stomach, underwent biopsy of the area. He was recommended to continue on PPI twice daily for 2 weeks and resume xarelto. He was discharged home then returned 3 days later on 07/28/13 with a complaint of increased fatigue and SOB. Repeat  2D echo demonstrated only minimal residual effusion with normal RV and LV function. 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 He was admitted and underwent a repeat EGD which showed healing of ulcers, no bleed. He was continued on PO iron and a PPI. His Hgb at time of discharge was stable at 9.0. He was discharged home. Cardiology had recommended an OP stress test. This was done 08/03/13 and showed normal myocardial perfusion and normal LV EF of 58%.  He returns today and has reported a marked improvement in his shortness of breath. He is able to do most activities without any difficulty. He continues on Eliquis for his pulmonary embolus and for stroke prevention. He is on flecainide for antiarrhythmic therapy. He denies any chest pain. His only complaint is fatigue. He reports some fatigue and notices it worse as he has been working on refurbishing a house that his mother used to own to sell. He reports his sleep is good at night however he does have difficulty falling asleep which may be playing a role in his fatigue.  [name] returns today for follow-up. He reports no significant difference in his symptoms. He gets some mild shortness of breath with exercise which she does very infrequently. He also reports leg swelling which is persistent. Weight has been fairly stable although could stand to go down. We talked about ways he could work on weight loss. He does have a history of lap band in the past however he is not been able to  lose anymore weight and in fact has gained it back. He denies any recurrent palpitation or A. Fib that he is aware of. He continues on flecainide which he is tolerating without any difficulty.  PMHx:  Past Medical History  Diagnosis Date  . Benign prostatic hypertrophy     several prostate biopsies neg for cancer.   . Hypertension   . Gout   . Depression   . FH: colonic polyps 2010  . Morbid obesity ([place])   . Complication of anesthesia     MORPHINE CAUSES  HALLUCINATIONS  . Asthma     childhood asthma  . Numbness     feet / legs  . History of kidney stones   . Kidney cysts   . Sleep apnea     USES C PAP  . Bilateral hip pain   . PAF (paroxysmal atrial fibrillation) with RVR 07/29/2013  . Anemia 07/29/2013    Past Surgical History  Procedure Laterality Date  . Total knee replacment      bilat  . Cholecystectomy  1989  . Trigger finger repair      also lue, cts surgically repaired  . Renal calculi    . Tonsillectomy    . Lithotripsy    . Arthroscopy knee w/ drilling    . Breath tek h pylori N/A 12/23/2012    Procedure: BREATH TEK H PYLORI;  Surgeon: Gayland Curry, MD;  Location: [facility];  Service: General;  Laterality: N/A;  . Wisdom tooth extraction    . Laparoscopic gastric banding with hiatal hernia repair N/A 04/08/2013    Procedure: LAPAROSCOPIC GASTRIC BANDING WITH possible  HIATAL HERNIA REPAIR;  Surgeon: Gayland Curry, MD;  Location: [facility];  Service: General;  Laterality: N/A;  . Subxyphoid pericardial window N/A 07/18/2013    Procedure: SUBXYPHOID PERICARDIAL WINDOW;  Surgeon: Grace Isaac, MD;  Location: [facility];  Service: Thoracic;  Laterality: N/A;  . Intraoperative transesophageal echocardiogram N/A 07/18/2013    Procedure: INTRAOPERATIVE TRANSESOPHAGEAL ECHOCARDIOGRAM;  Surgeon: Grace Isaac, MD;  Location: [facility];  Service: Open Heart Surgery;  Laterality: N/A;  . Esophagogastroduodenoscopy N/A 07/22/2013    Procedure: ESOPHAGOGASTRODUODENOSCOPY (EGD);  Surgeon: Irene Shipper, MD;  Location[facility];  Service: Endoscopy;  Laterality: N/A;  . Colonoscopy N/A 07/31/2013    Procedure: COLONOSCOPY;  Surgeon: Gatha Mayer, MD;  Location: [facility];  Service: Endoscopy;  Laterality: N/A;  . Esophagogastroduodenoscopy N/A 07/31/2013    Procedure: ESOPHAGOGASTRODUODENOSCOPY (EGD);  Surgeon: Gatha Mayer, MD;  Location: [facility] END[facility]COPY;  Service: Endoscopy;  Laterality: N/A;    FAMHx:  Family History  Problem  Relation Age of Onset  . Depression Mother   . Hypertension Mother   . Heart disease Father     MI  . Depression Brother   . Stroke Paternal Aunt     CVA  . Diabetes Paternal Uncle   . Heart disease Paternal Uncle     MI  . Heart disease Paternal Grandmother     MI  . Diabetes Cousin     PATERNAL    SOCHx:   reports that he quit smoking about 40 years ago. His smoking use included Cigarettes. He started smoking about 54 years ago. He has a 2.6 pack-year smoking history. He does not have any smokeless tobacco history on file. He reports that he drinks alcohol. He reports that he does not use illicit drugs.  ALLERGIES:  Allergies  Allergen Reactions  . Propoxyphene N-Acetaminophen Nausea  Only  . Codeine     nausea  . Morphine     Nausea Severe hallucinations  . Penicillins     LOC with shot    ROS: A comprehensive review of systems was negative except for: Cardiovascular: positive for fatigue  HOME MEDS: Current Outpatient Prescriptions  Medication Sig Dispense Refill  . aspirin 81 MG tablet Take 81 mg by mouth daily.    [name] CALCIUM PO Take 500 mg by mouth 3 (three) times daily.     . Cyanocobalamin (VITAMIN B-12) 1000 MCG SUBL Place 1,000 mcg under the tongue every morning.    [name] Sodium (DSS) 100 MG CAPS Take 100 mg by mouth every other day.    Noelle Penner ALLERGY RELIEF 10 MG tablet TAKE ONE TABLET BY MOUTH ONCE DAILY 30 tablet 11  . finasteride (PROSCAR) 5 MG tablet Take 5 mg by mouth daily.     . flecainide (TAMBOCOR) 50 MG tablet Take 1 tablet (50 mg total) by mouth every 12 (twelve) hours. 180 tablet 1  . fluticasone (FLONASE) 50 MCG/ACT nasal spray Place 2 sprays into both nostrils as needed.     . furosemide (LASIX) 20 MG tablet TAKE ONE TABLET BY MOUTH ONCE DAILY AS NEEDED FOR  SWELLING  IN  THE  LEGS 90 tablet 1  . LORazepam (ATIVAN) 0.5 MG tablet Take 1 tab in the AM and 2 at bed time. 90 tablet 2  . montelukast (SINGULAIR) 10 MG tablet TAKE ONE TABLET BY  MOUTH AT BEDTIME 30 tablet 11  . Multiple Vitamin (MULTIVITAMIN) tablet Take 1 tablet by mouth daily. Chewable tablet with iron    . pantoprazole (PROTONIX) 40 MG tablet TAKE ONE TABLET BY MOUTH TWICE DAILY 180 tablet 3  . risperiDONE (RISPERDAL) 0.5 MG tablet Take 1 tablet (0.5 mg total) by mouth at bedtime. 30 tablet 2  . rOPINIRole (REQUIP) 0.25 MG tablet 3  Per night or as directed for leg jerks 90 tablet 11  . sertraline (ZOLOFT) 100 MG tablet Take 1.5 tablets (150 mg total) by mouth daily with breakfast. 45 tablet 2  . Tetrahydrozoline HCl (VISINE OP) Place 2 drops into both eyes daily as needed (dry eyes).     No current facility-administered medications for this visit.    LABS/IMAGING: No results found for this or any previous visit (from the past 48 hour(s)). No results found.  VITALS: BP 138/90 mmHg  Pulse 58  Ht 6' (1.829 m)  Wt 337 lb 6 oz (153.032 kg)  BMI 45.75 kg/m2  EXAM: General appearance: alert and no distress Neck: no carotid bruit and no JVD Lungs: clear to auscultation bilaterally Heart: regular rate and rhythm, S1, S2 normal, no murmur, click, rub or gallop Abdomen: soft, non-tender; bowel sounds normal; no masses,  no organomegaly and obese Extremities: extremities normal, atraumatic, no cyanosis or edema Pulses: 2+ and symmetric Skin: Skin color, texture, turgor normal. No rashes or lesions Neurologic: Grossly normal Psych: Normal  EKG: Normal sinus rhythm at 58, QTC 445 ms   ASSESSMENT: 1. Paroxysmal atrial fibrillation on flecainide 2. Hyperlipidemia 3. Hypertension 4. Recent history of pericarditis/pericardial effusion status post pericardial window 5. Pulmonary embolus 6. Morbid obesity 7. Obstructive sleep apnea on CPAP  PLAN: 1.   [name] is now feeling better without the shortness of breath that he was feeling earlier. He does have fatigue which is somewhat nonspecific. He has trouble falling asleep at night which may be playing a  role in it. He  is using CPAP. He denies the shortness of breath that he was feeling earlier. He is in need of weight loss and is going to be working with the dietitian on that. He does have a history of weight loss surgery. He maintained sinus rhythm on flecainide. It is unclear to me whether his A. fib was just related to his pericardial effusion or whether he would benefit risk to develop A. fib without effusion. It is difficult to know whether he needs the flecainide long-term or not however I'm hesitant to stop it as he may redevelop A. fib and that might set him up for need for cardioversion. For now I would recommend continuing his flecainide and his Eliquis, which he is taking without any bleeding difficulties. Given his long-term use of flecainide, he will need to have ischemia testing every 2 years. He had a normal myoview prior to starting the medication in 2015.  Plan follow-up in 1 year.  Pixie Casino, MD, [facility] Attending Cardiologist Belle C [name] 05/02/2015, 6:06 PM

## 2015-05-06 ENCOUNTER — Encounter: Payer: Self-pay | Admitting: Internal Medicine

## 2015-05-06 ENCOUNTER — Ambulatory Visit (INDEPENDENT_AMBULATORY_CARE_PROVIDER_SITE_OTHER): Payer: Medicare Other | Admitting: Internal Medicine

## 2015-05-06 VITALS — BP 135/78 | HR 58 | Temp 98.3°F | Ht 72.0 in | Wt 335.8 lb

## 2015-05-06 DIAGNOSIS — I10 Essential (primary) hypertension: Secondary | ICD-10-CM

## 2015-05-06 NOTE — Patient Instructions (Signed)
Minimal Blood Pressure Goal= AVERAGE < 140/90;  Ideal is an AVERAGE < 135/85. This AVERAGE should be calculated from @ least 5-7 BP readings taken @ different times of day on different days of week. You should not respond to isolated BP readings , but rather the AVERAGE for that week .Please bring your  blood pressure cuff to office visits to verify that it is reliable.It  can also be checked against the blood pressure device at the pharmacy. Finger or wrist cuffs are not dependable; an arm cuff is. 

## 2015-05-06 NOTE — Progress Notes (Signed)
Pre visit review using our clinic review tool, if applicable. No additional management support is needed unless otherwise documented below in the visit note. 

## 2015-05-06 NOTE — Progress Notes (Signed)
   Subjective:    Patient ID: [name], male    DOB: July 04, 1945, 70 y.o.   MRN: [id number]  HPI His blood pressures have been 150-190/90-105 over the last 10 days. He's had some diffuse headache, mainly posteriorly. He also had epistaxis with excess nasal mucus. He denies definite upper respiratory tract infection symptoms otherwise. He has been using Mucinex at the recommendation of his Pharmacist.  He denies any other cardiovascular symptoms.  Renal function was checked 03/19/15. Creatinine was 1.37, BUN 23, and potassium 4. He is on furosemide 20 mg daily.  Review of Systems   Chest pain, palpitations, tachycardia, exertional dyspnea, paroxysmal nocturnal dyspnea, claudication or edema are absent.  Frontal headache, facial pain , nasal purulence, dental pain, sore throat , otic pain or otic discharge denied. No fever , chills or sweats.     Objective:   Physical Exam Pertinent or positive findings include: He has a grade 1/2 systolic murmur at the base. He has minimal rales at the bases which resolved with deep breathing. Abdomen is markedly protuberant. He has trace-1/2+ ankle edema.  General appearance :See BMI;adequately nourished; in no distress.  Eyes: No conjunctival inflammation or scleral icterus is present.  Oral exam:  Lips and gums are healthy appearing.  Heart:  Normal rate and regular rhythm. S1 and S2 normal without gallop, click, rub or other extra sounds    Lungs:Chest clear to auscultation; no wheezes, rhonchi,rales ,or rubs present.No increased work of breathing.   Abdomen: bowel sounds normal, soft and non-tender without masses, organomegaly or hernias noted.  No guarding or rebound.   Vascular : all pulses equal ; no bruits present.  Skin:Warm & dry.  Intact without suspicious lesions or rashes ; no tenting or jaundice   Lymphatic: No lymphadenopathy is noted about the head, neck, axilla   Neuro: Strength, tone  normal.      Assessment &  Plan:  #1 HTN; clinically controlled See AVS

## 2015-05-08 ENCOUNTER — Telehealth: Payer: Self-pay

## 2015-05-08 NOTE — Telephone Encounter (Signed)
Pt has been scheduled to come in for a nurse visit to compare cuffs.

## 2015-05-08 NOTE — Telephone Encounter (Signed)
PLEASE NOTE: All timestamps contained within this report are represented as [place] Standard Time. CONFIDENTIALTY NOTICE: This fax transmission is intended only for the addressee. It contains information that is legally privileged, confidential or otherwise protected from use or disclosure. If you are not the intended recipient, you are strictly prohibited from reviewing, disclosing, copying using or disseminating any of this information or taking any action in reliance on or regarding this information. If you have received this fax in error, please notify us immediately by telephone so that we can arrange for its return to [place]. Phone: [phone], Toll-Free: [phone], Fax: [phone] Page: 1 of 2 Call Id: [id number] [facility] [place] [facility] - Client [place] Patient Name: [name] [facility] Gender: Unknown DOB: 1946-03-25 Age: 70 Y 39 M 15 D Return Phone Number: [phone] (Primary), [phone] (Secondary) Address: City/State/Zip: [place] Client [facility] - Client Client Site [place] - Night Physician [place], [place] Type Call Call Type Triage / Clinical Relationship To Patient Self Return Phone Number [phone] (Primary) Chief Complaint Blood Pressure High Initial Comment Caller states his blood pressure has been reading high. His home kit and the BP machine at [employer] are giving him different results. He doesn't feel like he has high BP, he doesn't have any symptoms. Everything he's using, even a brand new blood pressure kit is giving him high numbers. 173/98 Nurse Assessment Nurse: Solocinski, RN, Beth Date/Time (Eastern Time): 05/07/2015 11:31:15 PM Confirm and document reason for call. If symptomatic, describe symptoms. You must click the next button to save text entered. ---Caller states he has been checking his blood pressure past view weeks. Seen at office Thursday and  bp was normal. Caller states he bought a new bp kit and kits are reading high like 173/98. Caller states he has 3 bp kits. Caller states he has no symptoms at this times. Caller would like to come in tomorrow and compare bp kit to office results and to determine if he has a blood pressure problem currently. Caller will call in the morning for an appointment. Has the patient traveled out of the country within the last 30 days? ---Not Applicable Does the patient have any new or worsening symptoms? ---No Please document clinical information provided and list any resource used. ---Advised caller to have his bp kit examined by office, since he feels like equipment is not working properly and to have doctor verify if his bp is high and treatment options if it is high. Caller did not want me to evaluate bp since no other symptoms at this time. Per nursing judgment. Guidelines Guideline Title Affirmed Question Affirmed Notes Nurse Date/Time (Eastern Time) Disp. Time Eilene Ghazi Time) Disposition Final User 05/07/2015 11:45:28 PM Clinical Call Yes Solocinski, RN, Beth After Care Instructions Given Call Event Type User Date / Time Description PLEASE NOTE: All timestamps contained within this report are represented as [place] Standard Time. CONFIDENTIALTY NOTICE: This fax transmission is intended only for the addressee. It contains information that is legally privileged, confidential or otherwise protected from use or disclosure. If you are not the intended recipient, you are strictly prohibited from reviewing, disclosing, copying using or disseminating any of this information or taking any action in reliance on or regarding this information. If you have received this fax in error, please notify us immediately by telephone so that we can arrange for its return to [place]. Phone: [phone], Toll-Free: [phone], Fax: [phone] Page: 2 of 2 Call  Id: [id number]

## 2015-05-10 ENCOUNTER — Ambulatory Visit: Payer: Medicare Other

## 2015-05-10 VITALS — BP 138/82

## 2015-05-10 DIAGNOSIS — I10 Essential (primary) hypertension: Secondary | ICD-10-CM

## 2015-06-13 ENCOUNTER — Emergency Department (HOSPITAL_COMMUNITY): Payer: Medicare Other

## 2015-06-13 ENCOUNTER — Inpatient Hospital Stay (HOSPITAL_COMMUNITY)
Admission: EM | Admit: 2015-06-13 | Discharge: 2015-06-16 | DRG: 175 | Disposition: A | Discharge status: Home / Self Care | Payer: Medicare Other | Attending: Internal Medicine | Admitting: Internal Medicine

## 2015-06-13 ENCOUNTER — Encounter (HOSPITAL_COMMUNITY): Payer: Self-pay | Admitting: Family Medicine

## 2015-06-13 DIAGNOSIS — I10 Essential (primary) hypertension: Secondary | ICD-10-CM | POA: Diagnosis not present

## 2015-06-13 DIAGNOSIS — K219 Gastro-esophageal reflux disease without esophagitis: Secondary | ICD-10-CM | POA: Diagnosis present

## 2015-06-13 DIAGNOSIS — F419 Anxiety disorder, unspecified: Secondary | ICD-10-CM | POA: Diagnosis present

## 2015-06-13 DIAGNOSIS — F329 Major depressive disorder, single episode, unspecified: Secondary | ICD-10-CM | POA: Diagnosis not present

## 2015-06-13 DIAGNOSIS — I110 Hypertensive heart disease with heart failure: Secondary | ICD-10-CM | POA: Diagnosis present

## 2015-06-13 DIAGNOSIS — M109 Gout, unspecified: Secondary | ICD-10-CM | POA: Diagnosis present

## 2015-06-13 DIAGNOSIS — N400 Enlarged prostate without lower urinary tract symptoms: Secondary | ICD-10-CM | POA: Diagnosis present

## 2015-06-13 DIAGNOSIS — Z87891 Personal history of nicotine dependence: Secondary | ICD-10-CM | POA: Diagnosis not present

## 2015-06-13 DIAGNOSIS — F32A Depression, unspecified: Secondary | ICD-10-CM | POA: Diagnosis present

## 2015-06-13 DIAGNOSIS — I480 Paroxysmal atrial fibrillation: Secondary | ICD-10-CM | POA: Diagnosis present

## 2015-06-13 DIAGNOSIS — Z880 Allergy status to penicillin: Secondary | ICD-10-CM

## 2015-06-13 DIAGNOSIS — Z6841 Body Mass Index (BMI) 40.0 and over, adult: Secondary | ICD-10-CM | POA: Diagnosis not present

## 2015-06-13 DIAGNOSIS — Z86711 Personal history of pulmonary embolism: Secondary | ICD-10-CM | POA: Diagnosis not present

## 2015-06-13 DIAGNOSIS — I3139 Other pericardial effusion (noninflammatory): Secondary | ICD-10-CM | POA: Diagnosis present

## 2015-06-13 DIAGNOSIS — I2609 Other pulmonary embolism with acute cor pulmonale: Secondary | ICD-10-CM | POA: Diagnosis present

## 2015-06-13 DIAGNOSIS — Z7982 Long term (current) use of aspirin: Secondary | ICD-10-CM | POA: Diagnosis not present

## 2015-06-13 DIAGNOSIS — Z8711 Personal history of peptic ulcer disease: Secondary | ICD-10-CM | POA: Diagnosis not present

## 2015-06-13 DIAGNOSIS — I2699 Other pulmonary embolism without acute cor pulmonale: Secondary | ICD-10-CM | POA: Diagnosis present

## 2015-06-13 DIAGNOSIS — Z96653 Presence of artificial knee joint, bilateral: Secondary | ICD-10-CM | POA: Diagnosis present

## 2015-06-13 DIAGNOSIS — J45909 Unspecified asthma, uncomplicated: Secondary | ICD-10-CM | POA: Diagnosis present

## 2015-06-13 DIAGNOSIS — J4520 Mild intermittent asthma, uncomplicated: Secondary | ICD-10-CM | POA: Diagnosis not present

## 2015-06-13 DIAGNOSIS — R0602 Shortness of breath: Secondary | ICD-10-CM | POA: Diagnosis not present

## 2015-06-13 DIAGNOSIS — R05 Cough: Secondary | ICD-10-CM | POA: Diagnosis not present

## 2015-06-13 DIAGNOSIS — Z85828 Personal history of other malignant neoplasm of skin: Secondary | ICD-10-CM | POA: Diagnosis not present

## 2015-06-13 DIAGNOSIS — G4733 Obstructive sleep apnea (adult) (pediatric): Secondary | ICD-10-CM | POA: Diagnosis present

## 2015-06-13 DIAGNOSIS — Z886 Allergy status to analgesic agent status: Secondary | ICD-10-CM | POA: Diagnosis not present

## 2015-06-13 DIAGNOSIS — I5032 Chronic diastolic (congestive) heart failure: Secondary | ICD-10-CM | POA: Diagnosis present

## 2015-06-13 DIAGNOSIS — R059 Cough, unspecified: Secondary | ICD-10-CM

## 2015-06-13 DIAGNOSIS — I313 Pericardial effusion (noninflammatory): Secondary | ICD-10-CM | POA: Diagnosis present

## 2015-06-13 DIAGNOSIS — I5030 Unspecified diastolic (congestive) heart failure: Secondary | ICD-10-CM | POA: Diagnosis present

## 2015-06-13 DIAGNOSIS — Z9884 Bariatric surgery status: Secondary | ICD-10-CM

## 2015-06-13 DIAGNOSIS — G629 Polyneuropathy, unspecified: Secondary | ICD-10-CM

## 2015-06-13 DIAGNOSIS — Z79899 Other long term (current) drug therapy: Secondary | ICD-10-CM | POA: Diagnosis not present

## 2015-06-13 HISTORY — DX: Heart failure, unspecified: I50.9

## 2015-06-13 HISTORY — DX: Other pulmonary embolism without acute cor pulmonale: I26.99

## 2015-06-13 LAB — CBC WITH DIFFERENTIAL/PLATELET
BASOS PCT: 0 %
Basophils Absolute: 0 10*3/uL (ref 0.0–0.1)
EOS ABS: 0.1 10*3/uL (ref 0.0–0.7)
Eosinophils Relative: 1 %
HEMATOCRIT: 42.2 % (ref 39.0–52.0)
Hemoglobin: 13.4 g/dL (ref 13.0–17.0)
LYMPHS ABS: 1.2 10*3/uL (ref 0.7–4.0)
Lymphocytes Relative: 11 %
MCH: 27.7 pg (ref 26.0–34.0)
MCHC: 31.8 g/dL (ref 30.0–36.0)
MCV: 87.4 fL (ref 78.0–100.0)
MONO ABS: 1.4 10*3/uL — AB (ref 0.1–1.0)
MONOS PCT: 12 %
Neutro Abs: 8.5 10*3/uL — ABNORMAL HIGH (ref 1.7–7.7)
Neutrophils Relative %: 76 %
Platelets: 170 10*3/uL (ref 150–400)
RBC: 4.83 MIL/uL (ref 4.22–5.81)
RDW: 14.1 % (ref 11.5–15.5)
WBC: 11.2 10*3/uL — ABNORMAL HIGH (ref 4.0–10.5)

## 2015-06-13 LAB — I-STAT CHEM 8, ED
BUN: 22 mg/dL — AB (ref 6–20)
CREATININE: 1.2 mg/dL (ref 0.61–1.24)
Calcium, Ion: 1 mmol/L — ABNORMAL LOW (ref 1.13–1.30)
Chloride: 100 mmol/L — ABNORMAL LOW (ref 101–111)
Glucose, Bld: 103 mg/dL — ABNORMAL HIGH (ref 65–99)
HEMATOCRIT: 46 % (ref 39.0–52.0)
HEMOGLOBIN: 15.6 g/dL (ref 13.0–17.0)
Potassium: 3.4 mmol/L — ABNORMAL LOW (ref 3.5–5.1)
SODIUM: 140 mmol/L (ref 135–145)
TCO2: 26 mmol/L (ref 0–100)

## 2015-06-13 LAB — I-STAT TROPONIN, ED: Troponin i, poc: 0 ng/mL (ref 0.00–0.08)

## 2015-06-13 MED ORDER — LORAZEPAM 0.5 MG PO TABS
0.5000 mg | ORAL_TABLET | Freq: Two times a day (BID) | ORAL | Status: DC
  Administered 2015-06-13 – 2015-06-16 (×6): 0.5 mg via ORAL
  Filled 2015-06-13 (×6): qty 1

## 2015-06-13 MED ORDER — OXYCODONE-ACETAMINOPHEN 5-325 MG PO TABS
2.0000 | ORAL_TABLET | Freq: Four times a day (QID) | ORAL | Status: DC | PRN
  Administered 2015-06-14 – 2015-06-15 (×4): 2 via ORAL
  Filled 2015-06-13 (×4): qty 2

## 2015-06-13 MED ORDER — LORATADINE 10 MG PO TABS
10.0000 mg | ORAL_TABLET | Freq: Every day | ORAL | Status: DC
  Administered 2015-06-13 – 2015-06-16 (×4): 10 mg via ORAL
  Filled 2015-06-13 (×4): qty 1

## 2015-06-13 MED ORDER — CALCIUM 500 MG PO TABS: 500.0000 mg | ORAL_TABLET | Freq: Three times a day (TID) | ORAL | Status: DC

## 2015-06-13 MED ORDER — ALBUTEROL SULFATE (2.5 MG/3ML) 0.083% IN NEBU
2.5000 mg | INHALATION_SOLUTION | RESPIRATORY_TRACT | Status: DC | PRN
Start: 2015-06-13 — End: 2015-06-16
  Administered 2015-06-14: 2.5 mg via RESPIRATORY_TRACT
  Filled 2015-06-13: qty 3

## 2015-06-13 MED ORDER — ASPIRIN 325 MG PO TABS: 650.0000 mg | ORAL_TABLET | Freq: Four times a day (QID) | ORAL | Status: DC | PRN

## 2015-06-13 MED ORDER — LORATADINE 10 MG PO TABS: 10.0000 mg | ORAL_TABLET | Freq: Every day | ORAL | Status: DC

## 2015-06-13 MED ORDER — VITAMIN B-12 1000 MCG PO TABS
1000.0000 ug | ORAL_TABLET | Freq: Every day | ORAL | Status: DC
  Administered 2015-06-14 – 2015-06-16 (×3): 1000 ug via ORAL
  Filled 2015-06-13 (×4): qty 1

## 2015-06-13 MED ORDER — ROPINIROLE HCL 0.5 MG PO TABS
0.7500 mg | ORAL_TABLET | Freq: Every day | ORAL | Status: DC
  Administered 2015-06-13 – 2015-06-15 (×3): 0.75 mg via ORAL
  Filled 2015-06-13 (×5): qty 1

## 2015-06-13 MED ORDER — SERTRALINE HCL 100 MG PO TABS
150.0000 mg | ORAL_TABLET | Freq: Every day | ORAL | Status: DC
  Administered 2015-06-14 – 2015-06-16 (×3): 150 mg via ORAL
  Filled 2015-06-13: qty 1
  Filled 2015-06-13: qty 2
  Filled 2015-06-13: qty 1

## 2015-06-13 MED ORDER — VITAMIN B-12 1000 MCG SL SUBL: 1000.0000 ug | SUBLINGUAL_TABLET | Freq: Every evening | SUBLINGUAL | Status: DC

## 2015-06-13 MED ORDER — RISPERIDONE 0.5 MG PO TABS
0.5000 mg | ORAL_TABLET | Freq: Every day | ORAL | Status: DC
  Administered 2015-06-13 – 2015-06-15 (×3): 0.5 mg via ORAL
  Filled 2015-06-13 (×5): qty 1

## 2015-06-13 MED ORDER — SODIUM CHLORIDE 0.9 % IV BOLUS (SEPSIS)
1000.0000 mL | Freq: Once | INTRAVENOUS | Status: AC
  Administered 2015-06-13: 1000 mL via INTRAVENOUS

## 2015-06-13 MED ORDER — POTASSIUM CHLORIDE 20 MEQ/15ML (10%) PO SOLN
20.0000 meq | Freq: Once | ORAL | Status: AC
  Administered 2015-06-13: 20 meq via ORAL
  Filled 2015-06-13: qty 15

## 2015-06-13 MED ORDER — ASPIRIN EC 81 MG PO TBEC
81.0000 mg | DELAYED_RELEASE_TABLET | Freq: Two times a day (BID) | ORAL | Status: DC
  Administered 2015-06-13 – 2015-06-16 (×6): 81 mg via ORAL
  Filled 2015-06-13 (×6): qty 1

## 2015-06-13 MED ORDER — ONDANSETRON HCL 4 MG/2ML IJ SOLN: 4.0000 mg | Freq: Three times a day (TID) | INTRAMUSCULAR | Status: DC | PRN

## 2015-06-13 MED ORDER — CALCIUM CARBONATE ANTACID 500 MG PO CHEW
1.0000 | CHEWABLE_TABLET | Freq: Three times a day (TID) | ORAL | Status: DC
  Administered 2015-06-13 – 2015-06-16 (×8): 200 mg via ORAL
  Filled 2015-06-13 (×9): qty 1

## 2015-06-13 MED ORDER — HEPARIN BOLUS VIA INFUSION
5000.0000 [IU] | Freq: Once | INTRAVENOUS | Status: AC
  Administered 2015-06-13: 5000 [IU] via INTRAVENOUS
  Filled 2015-06-13: qty 5000

## 2015-06-13 MED ORDER — PANTOPRAZOLE SODIUM 40 MG PO TBEC
40.0000 mg | DELAYED_RELEASE_TABLET | Freq: Two times a day (BID) | ORAL | Status: DC
  Administered 2015-06-13 – 2015-06-16 (×6): 40 mg via ORAL
  Filled 2015-06-13 (×6): qty 1

## 2015-06-13 MED ORDER — ADULT MULTIVITAMIN W/MINERALS CH
1.0000 | ORAL_TABLET | Freq: Every day | ORAL | Status: DC
  Administered 2015-06-14 – 2015-06-16 (×3): 1 via ORAL
  Filled 2015-06-13 (×4): qty 1

## 2015-06-13 MED ORDER — ACETAMINOPHEN 325 MG PO TABS
650.0000 mg | ORAL_TABLET | Freq: Once | ORAL | Status: AC
  Administered 2015-06-13: 650 mg via ORAL
  Filled 2015-06-13: qty 2

## 2015-06-13 MED ORDER — HEPARIN (PORCINE) IN NACL 100-0.45 UNIT/ML-% IJ SOLN
2000.0000 [IU]/h | INTRAMUSCULAR | Status: AC
  Administered 2015-06-13: 1800 [IU]/h via INTRAVENOUS
  Administered 2015-06-14 – 2015-06-15 (×3): 2000 [IU]/h via INTRAVENOUS
  Filled 2015-06-13 (×6): qty 250

## 2015-06-13 MED ORDER — SODIUM CHLORIDE 0.9% FLUSH
3.0000 mL | Freq: Two times a day (BID) | INTRAVENOUS | Status: DC
  Administered 2015-06-14 – 2015-06-16 (×4): 3 mL via INTRAVENOUS

## 2015-06-13 MED ORDER — IOHEXOL 350 MG/ML SOLN
80.0000 mL | Freq: Once | INTRAVENOUS | Status: AC | PRN
  Administered 2015-06-13: 100 mL via INTRAVENOUS

## 2015-06-13 MED ORDER — PROMETHAZINE HCL 25 MG PO TABS: 12.5000 mg | ORAL_TABLET | Freq: Four times a day (QID) | ORAL | Status: DC | PRN

## 2015-06-13 MED ORDER — ACETAMINOPHEN 325 MG PO TABS: 650.0000 mg | ORAL_TABLET | Freq: Four times a day (QID) | ORAL | Status: DC | PRN

## 2015-06-13 MED ORDER — ACETAMINOPHEN 650 MG RE SUPP: 650.0000 mg | Freq: Four times a day (QID) | RECTAL | Status: DC | PRN

## 2015-06-13 MED ORDER — FLECAINIDE ACETATE 50 MG PO TABS
50.0000 mg | ORAL_TABLET | Freq: Two times a day (BID) | ORAL | Status: DC
  Administered 2015-06-13 – 2015-06-16 (×5): 50 mg via ORAL
  Filled 2015-06-13 (×9): qty 1

## 2015-06-13 MED ORDER — HYDRALAZINE HCL 20 MG/ML IJ SOLN: 5.0000 mg | INTRAMUSCULAR | Status: DC | PRN

## 2015-06-13 MED ORDER — DM-GUAIFENESIN ER 30-600 MG PO TB12: 1.0000 | ORAL_TABLET | Freq: Two times a day (BID) | ORAL | Status: DC | PRN

## 2015-06-13 MED ORDER — FINASTERIDE 5 MG PO TABS
5.0000 mg | ORAL_TABLET | Freq: Every day | ORAL | Status: DC
  Administered 2015-06-13 – 2015-06-16 (×4): 5 mg via ORAL
  Filled 2015-06-13 (×5): qty 1

## 2015-06-13 NOTE — H&P (Addendum)
[facility] History and Physical  ZAHN SHAAK [id number] DOB: 02/06/1946 DOA: 06/13/2015  Referring physician: ED physician PCP: [name], MD  Specialists:   Chief Complaint: Right shoulder blade pain, shortness of breath  HPI: [name] is a 70 y.o. male with PMH of pulmonary embolism, hypertension, asthma, GERD, gout, depression, BPH, obesity, OSA on CPAP, PAF not on anticoagulants, diastolic congestive heart failure, skin cancer, pericardial effusion (S/P of pericardial window), upper GI bleeding due to gastric ulcer, who presents with right shoulder blade pain and shortness of breath.  Patient reports that he has been having pain over the right shoulder blade and shortness of breath in the past 4 days. He also has dry cough, but no CP, fever or chills. Patient does not have tenderness over calf areas. No recent long distance traveling. He has generalized weakness and headache. His headache has resolved after being treated with Tylenol in the emergency room. Patient had pulmonary embolism 2015, which was treated with Xarelto.  Patient reports having 6-7 bowel movement with loose stool today, but he is on Docusate. He does not have nausea, vomiting, abdominal pain. No symptoms of UTI. No unilateral weakness.  In ED, patient was found to have negative troponin, WBC 11.2, temperature normal, bradycardia, potassium 3.4, creatinine 1.2. CT angiogram of chest showed submassive PE over RML and RLL with evidence of right heart strain. Patient is admitted to inpatient for further events and treatment.  EKG: Independently reviewed. QTC 490, T-wave flattening.  Where does patient live?   At home  Can patient participate in ADLs?  Yes   Review of Systems:   General: no fevers, chills, no changes in body weight, has fatigue HEENT: no blurry vision, hearing changes or sore throat Pulm: has dyspnea, coughing, no wheezing CV: no chest pain, no palpitations Abd: no nausea,  vomiting, abdominal pain, has diarrhea, no constipation GU: no dysuria, burning on urination, increased urinary frequency, hematuria  Ext: has treace leg edema Neuro: no unilateral weakness, numbness, or tingling, no vision change or hearing loss Skin: no rash MSK: No muscle spasm, no deformity, no limitation of range of movement in spin Heme: No easy bruising.  Travel history: No recent long distant travel.  Allergy:  Allergies  Allergen Reactions  . Morphine Other (See Comments)    Nausea and Severe hallucinations  . Codeine Nausea Only  . Penicillins Other (See Comments)    LOC with shot  . Propoxyphene N-Acetaminophen Nausea Only    Past Medical History  Diagnosis Date  . Benign prostatic hypertrophy     several prostate biopsies neg for cancer.   . Hypertension   . Gout   . Depression   . FH: colonic polyps 2010  . Morbid obesity ([place])   . Complication of anesthesia     MORPHINE CAUSES HALLUCINATIONS  . Asthma     childhood asthma  . Numbness     feet / legs  . History of kidney stones   . Kidney cysts   . Sleep apnea     USES C PAP  . Bilateral hip pain   . PAF (paroxysmal atrial fibrillation) with RVR 07/29/2013  . Anemia 07/29/2013  . CHF (congestive heart failure) ([place])   . Cancer [facility])     Past Surgical History  Procedure Laterality Date  . Total knee replacment      bilat  . Cholecystectomy  1989  . Trigger finger repair      also lue, cts surgically repaired  .  Renal calculi    . Tonsillectomy    . Lithotripsy    . Arthroscopy knee w/ drilling    . Breath tek h pylori N/A 12/23/2012    Procedure: BREATH TEK H PYLORI;  Surgeon: Gayland Curry, MD;  Location: [facility];  Service: General;  Laterality: N/A;  . Wisdom tooth extraction    . Laparoscopic gastric banding with hiatal hernia repair N/A 04/08/2013    Procedure: LAPAROSCOPIC GASTRIC BANDING WITH possible  HIATAL HERNIA REPAIR;  Surgeon: Gayland Curry, MD;  Location: [facility];  Service:  General;  Laterality: N/A;  . Subxyphoid pericardial window N/A 07/18/2013    Procedure: SUBXYPHOID PERICARDIAL WINDOW;  Surgeon: Grace Isaac, MD;  Location: [place];  Service: Thoracic;  Laterality: N/A;  . Intraoperative transesophageal echocardiogram N/A 07/18/2013    Procedure: INTRAOPERATIVE TRANSESOPHAGEAL ECHOCARDIOGRAM;  Surgeon: Grace Isaac, MD;  Location: [place];  Service: Open Heart Surgery;  Laterality: N/A;  . Esophagogastroduodenoscopy N/A 07/22/2013    Procedure: ESOPHAGOGASTRODUODENOSCOPY (EGD);  Surgeon: Irene Shipper, MD;  Location: [facility] ENDOSCOPY;  Service: Endoscopy;  Laterality: N/A;  . Colonoscopy N/A 07/31/2013    Procedure: COLONOSCOPY;  Surgeon: Gatha Mayer, MD;  Location: [place];  Service: Endoscopy;  Laterality: N/A;  . Esophagogastroduodenoscopy N/A 07/31/2013    Procedure: ESOPHAGOGASTRODUODENOSCOPY (EGD);  Surgeon: Gatha Mayer, MD;  Location[facility] END[facility]COPY;  Service: Endoscopy;  Laterality: N/A;    Social History:  reports that he quit smoking about 40 years ago. His smoking use included Cigarettes. He started smoking about 54 years ago. He has a 2.6 pack-year smoking history. He does not have any smokeless tobacco history on file. He reports that he drinks alcohol. He reports that he does not use illicit drugs.  Family History:  Family History  Problem Relation Age of Onset  . Depression Mother   . Hypertension Mother   . Heart disease Father     MI  . Depression Brother   . Stroke Paternal Aunt     CVA  . Diabetes Paternal Uncle   . Heart disease Paternal Uncle     MI  . Heart disease Paternal Grandmother     MI  . Diabetes Cousin     PATERNAL     Prior to Admission medications   Medication Sig Start Date End Date Taking? Authorizing Provider  aspirin 325 MG tablet Take 650 mg by mouth every 6 (six) hours as needed for headache.   Yes Historical Provider, MD  aspirin 81 MG tablet Take 81 mg by mouth 2 (two) times daily.    Yes  Historical Provider, MD  CALCIUM PO Take 500 mg by mouth 3 (three) times daily.    Yes Historical Provider, MD  Cyanocobalamin (VITAMIN B-12) 1000 MCG SUBL Place 1,000 mcg under the tongue every evening.    Yes Historical Provider, MD  Docusate Sodium (DSS) 100 MG CAPS Take 100 mg by mouth every other day. 07/25/13  Yes Kelvin Cellar, MD  finasteride (PROSCAR) 5 MG tablet Take 5 mg by mouth daily.    Yes Historical Provider, MD  flecainide (TAMBOCOR) 50 MG tablet Take 1 tablet (50 mg total) by mouth every 12 (twelve) hours. 01/05/15  Yes Pixie Casino, MD  furosemide (LASIX) 20 MG tablet TAKE ONE TABLET BY MOUTH ONCE DAILY AS NEEDED FOR  SWELLING  IN  THE  LEGS Patient taking differently: TAKE ONE TABLET BY MOUTH ONCE DAILY 01/06/15  Yes Hendricks Limes, MD  loratadine (CLARITIN) 10 MG tablet Take 10 mg by mouth daily.   Yes Historical Provider, MD  LORazepam (ATIVAN) 0.5 MG tablet Take 1 tab in the AM and 2 at bed time. 03/16/15  Yes Kathlee Nations, MD  Multiple Vitamin (MULTIVITAMIN) tablet Take 1 tablet by mouth daily. Chewable tablet with iron   Yes Historical Provider, MD  pantoprazole (PROTONIX) 40 MG tablet TAKE ONE TABLET BY MOUTH TWICE DAILY 08/24/14  Yes Hendricks Limes, MD  risperiDONE (RISPERDAL) 0.5 MG tablet Take 1 tablet (0.5 mg total) by mouth at bedtime. 03/16/15  Yes Kathlee Nations, MD  rOPINIRole (REQUIP) 0.25 MG tablet 3  Per night or as directed for leg jerks Patient taking differently: Take 0.75 mg by mouth at bedtime. 3  Per night or as directed for leg jerks 08/20/14  Yes Deneise Lever, MD  sertraline (ZOLOFT) 100 MG tablet Take 1.5 tablets (150 mg total) by mouth daily with breakfast. 03/16/15  Yes Kathlee Nations, MD  EQ ALLERGY RELIEF 10 MG tablet TAKE ONE TABLET BY MOUTH ONCE DAILY 08/21/14   Hendricks Limes, MD  montelukast (SINGULAIR) 10 MG tablet TAKE ONE TABLET BY MOUTH AT BEDTIME Patient not taking: Reported on 05/06/2015 06/15/14   Hendricks Limes, MD    Physical  Exam: Filed Vitals:   06/13/15 2000 06/13/15 2015 06/13/15 2019 06/13/15 2030  BP: 136/69 140/67  148/79  Pulse: 57 56  64  Temp:      Resp: 12 14  22   SpO2: 97% 97% 93% 98%   General: Not in acute distress HEENT:       Eyes: PERRL, EOMI, no scleral icterus.       ENT: No discharge from the ears and nose, no pharynx injection, no tonsillar enlargement.        Neck: No JVD, no bruit, no mass felt. Heme: No neck lymph node enlargement. Cardiac: S1/S2, RRR, No murmurs, No gallops or rubs. Pulm: No rales, wheezing, rhonchi or rubs. Abd: Soft, nondistended, nontender, no rebound pain, no organomegaly, BS present. Ext: has trace leg edema bilaterally. 2+DP/PT pulse bilaterally. Musculoskeletal: No joint deformities, No joint redness or warmth, no limitation of ROM in spin. Skin: No rashes.  Neuro: Alert, oriented X3, cranial nerves II-XII grossly intact, moves all extremities normally.  Psych: Patient is not psychotic, no suicidal or hemocidal ideation.  Labs on Admission:  Basic Metabolic Panel:  Recent Labs Lab 06/13/15 1732  NA 140  K 3.4*  CL 100*  GLUCOSE 103*  BUN 22*  CREATININE 1.20   Liver Function Tests: No results for input(s): AST, ALT, ALKPHOS, BILITOT, PROT, ALBUMIN in the last 168 hours. No results for input(s): LIPASE, AMYLASE in the last 168 hours. No results for input(s): AMMONIA in the last 168 hours. CBC:  Recent Labs Lab 06/13/15 1700 06/13/15 1732  WBC 11.2*  --   NEUTROABS 8.5*  --   HGB 13.4 15.6  HCT 42.2 46.0  MCV 87.4  --   PLT 170  --    Cardiac Enzymes: No results for input(s): CKTOTAL, CKMB, CKMBINDEX, TROPONINI in the last 168 hours.  BNP (last 3 results) No results for input(s): BNP in the last 8760 hours.  ProBNP (last 3 results) No results for input(s): PROBNP in the last 8760 hours.  CBG: No results for input(s): GLUCAP in the last 168 hours.  Radiological Exams on Admission: Dg Chest 2 View  06/13/2015  CLINICAL DATA:   Nonproductive cough. EXAM: CHEST  2 VIEW COMPARISON:  12/15/2013 chest radiograph. FINDINGS: Stable cardiomediastinal silhouette with normal heart size. No pneumothorax. Trace bilateral pleural effusions. No pulmonary edema. There is new patchy consolidation in the peripheral mid to lower right lung with associated pleural thickening. IMPRESSION: 1. New patchy consolidation in the peripheral mid to lower right lung, which could represent a right middle lobe pneumonia in the correct clinical setting. Recommend follow-up PA and lateral post treatment chest radiographs in 4-6 weeks. 2. Trace bilateral pleural effusions. Electronically Signed   By: Ilona Sorrel M.D.   On: 06/13/2015 16:07   Ct Angio Chest Pe W/cm &/or Wo Cm  06/13/2015  CLINICAL DATA:  Cough weakness and upper back pain. EXAM: CT ANGIOGRAPHY CHEST WITH CONTRAST TECHNIQUE: Multidetector CT imaging of the chest was performed using the standard protocol during bolus administration of intravenous contrast. Multiplanar CT image reconstructions and MIPs were obtained to evaluate the vascular anatomy. CONTRAST:  80 cc Omnipaque 350 COMPARISON:  07/16/2013. FINDINGS: Mediastinum / Lymph Nodes: There is no axillary lymphadenopathy. No mediastinal lymphadenopathy. There is no hilar lymphadenopathy. The heart is at upper normal for size. No pericardial effusion. Coronary artery calcification is noted. The esophagus has normal imaging features.Acute pulmonary embolus is identified in lobar pulmonary arteries to the right middle and lower lobes. Segmental and subsegmental pulmonary embolus is noted in the lower lobes bilaterally. Lungs / Pleura: Wedge-shaped peripheral airspace disease in the right middle lobe is compatible with infarct. There is compressive atelectasis in the lower lobes bilaterally. Tiny right pleural effusion is evident. Upper Abdomen: Patient is status post laparoscopic banding procedure. Otherwise unremarkable. MSK / Soft Tissues: Bone  windows reveal no worrisome lytic or sclerotic osseous lesions. Review of the MIP images confirms the above findings. IMPRESSION: 1. Acute lobar pulmonary embolus to the right middle and lower lobes with segmental and subsegmental pulmonary embolus to the lower lobes bilaterally. Positive for acute PE with CT evidence of right heart strain (RV/LV Ratio = 1.1) consistent with at least submassive (intermediate risk) PE. The presence of right heart strain has been associated with an increased risk of morbidity and mortality. Please activate Code PE by paging [phone]. 2. Right middle lobe infarct. Critical Value/emergent results were called by me at the time of interpretation on 06/13/2015 at 6:49 pm to Dr. Blanchie Dessert , who verbally acknowledged these results. Electronically Signed   By: Misty Stanley M.D.   On: 06/13/2015 18:49    Assessment/Plan Principal Problem:   Recurrent pulmonary emboli (HCC) Active Problems:   Depression   HTN (hypertension)   BPH (benign prostatic hyperplasia)   Obstructive sleep apnea   Peripheral neuropathy (HCC)   Morbid obesity ([place])   H/O laparoscopic adjustable gastric banding & hiatal hernia repair 04/08/13   Pericardial effusion s/p pericardial window 123XX123 [id number]   Diastolic congestive heart failure (HCC)   PAF (paroxysmal atrial fibrillation) (HCC)   GERD (gastroesophageal reflux disease)   Asthma   Recurrent pulmonary emboli ([place]):  CT angiogram of chest showed submassive PE over RML and RLL with evidence of right heart strain. Pt is hemodynamically stable currently, No CP. PCCM was consulted by EDP, and recommended to admit pt to SDU. This is recurrent PE, pt need AC for life-long time.  -admit to stepdown for close monitoring overnight -heparin drip initiated -2D echocardiogram ordered -LE dopplers ordered to evaluate for DVT -repeat EKG in a.m. -pain control: When necessary Percocet and tylenol (pt is allergic to morphine).  Atrial  Fibrillation: CHA2DS2-VASc Score is  4, needs oral anticoagulation. Patient was on Xarelto in the past which was d/c'ed due to upper GIB from gastric ulcer. The repeated EGD on 07/31/13 showed that his gastric ulcer had resolved. Heart rate is well controlled. Pt is on flecainide. QTc=490 -Continue Flecainide -now on IV heparin for PE -repeat EKG in am for monitoring QTc  GERD: -Protonix 40 mg bid  Depression and anxiety: Stable, no suicidal or homicidal ideations. -Continue home medications: Ativan, risperidone, Requip, Zoloft  Asthma: stable -prn Albuterol nebs - Mucinex for cough  HTN: -hold lasix in the setting submassive PE -IV hydralazine when necessary  BPH: stable - Continue Proscar  OSA: -CPAP  Chronic diastolic congestive heart failure: 2-D echo on 12/16/13 showed EF 65-70 percent. Patient is on Lasix 20 mg when necessary. Has only trace leg edema and no JVD, CHF is compensated. -Hold Lasix -Check BNP   DVT ppx: on IV Heparin   Code Status: Full code Family Communication: Yes, patient's wife and son  at bed side Disposition Plan: Admit to inpatient   Date of Service 06/13/2015    Ivor Costa [facility] Pager [phone]  If 7PM-7AM, please contact night-coverage [email or web address] Password [facility] 06/13/2015, 8:42 PM

## 2015-06-13 NOTE — ED Notes (Signed)
Pt here for right sided headache x 4 days, cough, weakness, and upper back pain.

## 2015-06-13 NOTE — ED Provider Notes (Addendum)
CSN: [id number]     Arrival date & time 06/13/15  1415 History   First MD Initiated Contact with Patient 06/13/15 1609     Chief Complaint  Patient presents with  . Cough  . Back Pain  . Headache  . Weakness     (Consider location/radiation/quality/duration/timing/severity/associated sxs/prior Treatment) Patient is a 70 y.o. male presenting with cough, back pain, headaches, and weakness. The history is provided by the patient.  Cough Cough characteristics:  Non-productive and dry Severity:  Mild Onset quality:  Gradual Duration:  3 days Timing:  Constant Progression:  Worsening Chronicity:  New Smoker: no   Context comment:  Right shoulder blade pain, and mild SOB Relieved by:  Nothing Worsened by:  Deep breathing (Cough) Ineffective treatments:  None tried Associated symptoms: headaches and shortness of breath   Associated symptoms: no chest pain, no fever, no rhinorrhea and no wheezing   Risk factors comment:  Prior history 2 years ago of pericardial tympanotomy requiring pericardial window, atrial fibrillation, PE on blood thinner for short time now currently on aspirin. No recent unilateral leg pain or swelling, immobilization or surgery. Back Pain Associated symptoms: headaches and weakness   Associated symptoms: no chest pain and no fever   Headache Associated symptoms: back pain, cough, diarrhea and weakness   Associated symptoms: no fever   Weakness Associated symptoms include headaches and shortness of breath. Pertinent negatives include no chest pain.    Past Medical History  Diagnosis Date  . Benign prostatic hypertrophy     several prostate biopsies neg for cancer.   . Hypertension   . Gout   . Depression   . FH: colonic polyps 2010  . Morbid obesity ([place])   . Complication of anesthesia     MORPHINE CAUSES HALLUCINATIONS  . Asthma     childhood asthma  . Numbness     feet / legs  . History of kidney stones   . Kidney cysts   . Sleep apnea     USES  C PAP  . Bilateral hip pain   . PAF (paroxysmal atrial fibrillation) with RVR 07/29/2013  . Anemia 07/29/2013   Past Surgical History  Procedure Laterality Date  . Total knee replacment      bilat  . Cholecystectomy  1989  . Trigger finger repair      also lue, cts surgically repaired  . Renal calculi    . Tonsillectomy    . Lithotripsy    . Arthroscopy knee w/ drilling    . Breath tek h pylori N/A 12/23/2012    Procedure: BREATH TEK H PYLORI;  Surgeon: Gayland Curry, MD;  Location: [facility];  Service: General;  Laterality: N/A;  . Wisdom tooth extraction    . Laparoscopic gastric banding with hiatal hernia repair N/A 04/08/2013    Procedure: LAPAROSCOPIC GASTRIC BANDING WITH possible  HIATAL HERNIA REPAIR;  Surgeon: Gayland Curry, MD;  Location: [facility];  Service: General;  Laterality: N/A;  . Subxyphoid pericardial window N/A 07/18/2013    Procedure: SUBXYPHOID PERICARDIAL WINDOW;  Surgeon: Grace Isaac, MD;  Location: [place];  Service: Thoracic;  Laterality: N/A;  . Intraoperative transesophageal echocardiogram N/A 07/18/2013    Procedure: INTRAOPERATIVE TRANSESOPHAGEAL ECHOCARDIOGRAM;  Surgeon: Grace Isaac, MD;  Location: [place];  Service: Open Heart Surgery;  Laterality: N/A;  . Esophagogastroduodenoscopy N/A 07/22/2013    Procedure: ESOPHAGOGASTRODUODENOSCOPY (EGD);  Surgeon: Irene Shipper, MD;  Location: [facility];  Service: Endoscopy;  Laterality: N/A;  .  Colonoscopy N/A 07/31/2013    Procedure: COLONOSCOPY;  Surgeon: Gatha Mayer, MD;  Location: [place];  Service: Endoscopy;  Laterality: N/A;  . Esophagogastroduodenoscopy N/A 07/31/2013    Procedure: ESOPHAGOGASTRODUODENOSCOPY (EGD);  Surgeon: Gatha Mayer, MD;  Location: [facility] END[facility]COPY;  Service: Endoscopy;  Laterality: N/A;   Family History  Problem Relation Age of Onset  . Depression Mother   . Hypertension Mother   . Heart disease Father     MI  . Depression Brother   . Stroke Paternal Aunt     CVA  .  Diabetes Paternal Uncle   . Heart disease Paternal Uncle     MI  . Heart disease Paternal Grandmother     MI  . Diabetes Cousin     PATERNAL   Social History  Substance Use Topics  . Smoking status: Former Smoker -- 0.20 packs/day for 13 years    Types: Cigarettes    Start date: 04/03/1961    Quit date: 08/30/1974  . Smokeless tobacco: None  . Alcohol Use: 0.0 oz/week    0 Standard drinks or equivalent per week     Comment: Occasionally; 1 small drink a week    Review of Systems  Constitutional: Negative for fever.  HENT: Negative for rhinorrhea.   Respiratory: Positive for cough and shortness of breath. Negative for wheezing.   Cardiovascular: Negative for chest pain.  Gastrointestinal: Positive for diarrhea.       Diarrhea over last 24 hours  Musculoskeletal: Positive for back pain.  Neurological: Positive for weakness and headaches.  All other systems reviewed and are negative.     Allergies  Codeine; Morphine; Penicillins; and Propoxyphene n-acetaminophen  Home Medications   Prior to Admission medications   Medication Sig Start Date End Date Taking? Authorizing Provider  aspirin 325 MG tablet Take 650 mg by mouth every 6 (six) hours as needed for headache.   Yes Historical Provider, MD  aspirin 81 MG tablet Take 81 mg by mouth daily.    Historical Provider, MD  CALCIUM PO Take 500 mg by mouth 3 (three) times daily.     Historical Provider, MD  Cyanocobalamin (VITAMIN B-12) 1000 MCG SUBL Place 1,000 mcg under the tongue every morning.    Historical Provider, MD  Docusate Sodium (DSS) 100 MG CAPS Take 100 mg by mouth every other day. 07/25/13   Kelvin Cellar, MD  EQ ALLERGY RELIEF 10 MG tablet TAKE ONE TABLET BY MOUTH ONCE DAILY 08/21/14   Hendricks Limes, MD  finasteride (PROSCAR) 5 MG tablet Take 5 mg by mouth daily.     Historical Provider, MD  flecainide (TAMBOCOR) 50 MG tablet Take 1 tablet (50 mg total) by mouth every 12 (twelve) hours. 01/05/15   Pixie Casino, MD  fluticasone (FLONASE) 50 MCG/ACT nasal spray Place 2 sprays into both nostrils as needed.  08/04/13   Geradine Girt, DO  furosemide (LASIX) 20 MG tablet TAKE ONE TABLET BY MOUTH ONCE DAILY AS NEEDED FOR  SWELLING  IN  THE  LEGS 01/06/15   Hendricks Limes, MD  LORazepam (ATIVAN) 0.5 MG tablet Take 1 tab in the AM and 2 at bed time. 03/16/15   Kathlee Nations, MD  montelukast (SINGULAIR) 10 MG tablet TAKE ONE TABLET BY MOUTH AT BEDTIME Patient not taking: Reported on 05/06/2015 06/15/14   Hendricks Limes, MD  Multiple Vitamin (MULTIVITAMIN) tablet Take 1 tablet by mouth daily. Chewable tablet with iron    Historical Provider,  MD  pantoprazole (PROTONIX) 40 MG tablet TAKE ONE TABLET BY MOUTH TWICE DAILY 08/24/14   Hendricks Limes, MD  risperiDONE (RISPERDAL) 0.5 MG tablet Take 1 tablet (0.5 mg total) by mouth at bedtime. 03/16/15   Kathlee Nations, MD  rOPINIRole (REQUIP) 0.25 MG tablet 3  Per night or as directed for leg jerks 08/20/14   Deneise Lever, MD  sertraline (ZOLOFT) 100 MG tablet Take 1.5 tablets (150 mg total) by mouth daily with breakfast. 03/16/15   Kathlee Nations, MD  Tetrahydrozoline HCl (VISINE OP) Place 2 drops into both eyes daily as needed (dry eyes).    Historical Provider, MD   BP 139/69 mmHg  Pulse 70  Temp(Src) 98.1 F (36.7 C)  Resp 18  SpO2 93% Physical Exam  Constitutional: He is oriented to person, place, and time. He appears well-developed and well-nourished. No distress.  HENT:  Head: Normocephalic and atraumatic.  Mouth/Throat: Oropharynx is clear and moist.  Eyes: Conjunctivae and EOM are normal. Pupils are equal, round, and reactive to light.  Neck: Normal range of motion. Neck supple.  Cardiovascular: Normal rate, regular rhythm and intact distal pulses.   No murmur heard. Pulmonary/Chest: Effort normal and breath sounds normal. No respiratory distress. He has no wheezes. He has no rales. He exhibits no tenderness.  Abdominal: Soft. He exhibits no  distension. There is no tenderness. There is no rebound and no guarding.  Musculoskeletal: Normal range of motion. He exhibits no edema or tenderness.  Neurological: He is alert and oriented to person, place, and time.  Skin: Skin is warm and dry. No rash noted. No erythema.  Psychiatric: He has a normal mood and affect. His behavior is normal.  Nursing note and vitals reviewed.   ED Course  Procedures (including critical care time) Labs Review Labs Reviewed  CBC WITH DIFFERENTIAL/PLATELET - Abnormal; Notable for the following:    WBC 11.2 (*)    Neutro Abs 8.5 (*)    Monocytes Absolute 1.4 (*)    All other components within normal limits  I-STAT CHEM 8, ED - Abnormal; Notable for the following:    Potassium 3.4 (*)    Chloride 100 (*)    BUN 22 (*)    Glucose, Bld 103 (*)    Calcium, Ion 1.00 (*)    All other components within normal limits  Randolm Idol, ED    Imaging Review Dg Chest 2 View  06/13/2015  CLINICAL DATA:  Nonproductive cough. EXAM: CHEST  2 VIEW COMPARISON:  12/15/2013 chest radiograph. FINDINGS: Stable cardiomediastinal silhouette with normal heart size. No pneumothorax. Trace bilateral pleural effusions. No pulmonary edema. There is new patchy consolidation in the peripheral mid to lower right lung with associated pleural thickening. IMPRESSION: 1. New patchy consolidation in the peripheral mid to lower right lung, which could represent a right middle lobe pneumonia in the correct clinical setting. Recommend follow-up PA and lateral post treatment chest radiographs in 4-6 weeks. 2. Trace bilateral pleural effusions. Electronically Signed   By: Ilona Sorrel M.D.   On: 06/13/2015 16:07   I have personally reviewed and evaluated these images and lab results as part of my medical decision-making.   EKG Interpretation   Date/Time:  Sunday June 13 2015 17:46:46 EDT Ventricular Rate:  62 PR Interval:  167 QRS Duration: 113 QT Interval:  483 QTC Calculation:  490 R Axis:   69 Text Interpretation:  Sinus rhythm Borderline intraventricular conduction  delay Borderline T wave abnormalities Borderline prolonged  QT interval No  significant change since last tracing Confirmed by [facility]  MD, Loree Fee  [phone]) on 06/13/2015 6:13:26 PM      MDM   Final diagnoses:  Other acute pulmonary embolism with acute cor pulmonale (HCC)   Pt is a 70 y/o male with hx of prior PE/pericardial tamponade/causative PEs that patient no longer on anticoagulation who has had 3 days of right shoulder blade pain, cough and generalized weakness. He is also complaining of a headache that started at the same time.  Patient has had mild shortness of breath but denies being short of breath currently. He denies any chest pain.    Chest x-ray done in the waiting room today shows a new patchy consolidation in the peripheral lower right lung which could represent pneumonia. However given patient's history and his symptoms the possibility of pneumonia is present however also concern for PE. CBC, chem 8, troponin, EKG and CT of the chest pending.  7:10 PM CT is consistent with a PE showing LV strain with a correlation of 1.1.  Patient started on a heparin drip. It sounds like when he had several toe while he was critically ill he had some bleeding in his liver and a stomach ulcer which resolved quickly and he has never had any other bleeding since.  We'll discuss with critical care if he is a candidate for catheter directed lysis.  CRITICAL CARE Performed by: Blanchie Dessert Total critical care time: 30 minutes Critical care time was exclusive of separately billable procedures and treating other patients. Critical care was necessary to treat or prevent imminent or life-threatening deterioration. Critical care was time spent personally by me on the following activities: development of treatment plan with patient and/or surrogate as well as nursing, discussions with consultants,  evaluation of patient's response to treatment, examination of patient, obtaining history from patient or surrogate, ordering and performing treatments and interventions, ordering and review of laboratory studies, ordering and review of radiographic studies, pulse oximetry and re-evaluation of patient's condition.   Blanchie Dessert, MD 06/13/15 1912  Blanchie Dessert, MD 06/13/15 [phone]

## 2015-06-13 NOTE — Progress Notes (Signed)
ANTICOAGULATION CONSULT NOTE - Initial Consult  Pharmacy Consult for heparin Indication: pulmonary embolus  Allergies  Allergen Reactions  . Morphine Other (See Comments)    Nausea and Severe hallucinations  . Codeine Nausea Only  . Penicillins Other (See Comments)    LOC with shot  . Propoxyphene N-Acetaminophen Nausea Only    Patient Measurements:   Heparin Dosing Weight: 111 kg  Vital Signs: Temp: 98.1 F (36.7 C) (03/12 1446) BP: 131/76 mmHg (03/12 1915) Pulse Rate: 58 (03/12 1915)  Labs:  Recent Labs  06/13/15 1700 06/13/15 1732  HGB 13.4 15.6  HCT 42.2 46.0  PLT 170  --   CREATININE  --  1.20    CrCl cannot be calculated (Unknown ideal weight.).   Medical History: Past Medical History  Diagnosis Date  . Benign prostatic hypertrophy     several prostate biopsies neg for cancer.   . Hypertension   . Gout   . Depression   . FH: colonic polyps 2010  . Morbid obesity ([place])   . Complication of anesthesia     MORPHINE CAUSES HALLUCINATIONS  . Asthma     childhood asthma  . Numbness     feet / legs  . History of kidney stones   . Kidney cysts   . Sleep apnea     USES C PAP  . Bilateral hip pain   . PAF (paroxysmal atrial fibrillation) with RVR 07/29/2013  . Anemia 07/29/2013  . CHF (congestive heart failure) ([place])   . Cancer [facility])     Assessment: 1 yoM with PMHX of PE and afib that stopped therapy with NOAC in 1/16 presents to ER with shoulder pain and HA. CTA positive for PE. Pharmacy to start heparin.   Goal of Therapy:  Heparin level 0.3-0.7 units/ml Monitor platelets by anticoagulation protocol: Yes   Plan:  1. Give 5000 units bolus x 1 2. Start heparin infusion at 1800 units/hr 3. Check anti-Xa level in 6 hours and daily while on heparin 4. Continue to monitor H&H and platelets  Duayne Cal 06/13/2015,7:28 PM

## 2015-06-14 ENCOUNTER — Inpatient Hospital Stay (HOSPITAL_COMMUNITY): Payer: Medicare Other

## 2015-06-14 DIAGNOSIS — I2699 Other pulmonary embolism without acute cor pulmonale: Secondary | ICD-10-CM

## 2015-06-14 DIAGNOSIS — I10 Essential (primary) hypertension: Secondary | ICD-10-CM

## 2015-06-14 DIAGNOSIS — J4520 Mild intermittent asthma, uncomplicated: Secondary | ICD-10-CM

## 2015-06-14 LAB — BRAIN NATRIURETIC PEPTIDE: B NATRIURETIC PEPTIDE 5: 88.3 pg/mL (ref 0.0–100.0)

## 2015-06-14 LAB — COMPREHENSIVE METABOLIC PANEL
ALBUMIN: 2.9 g/dL — AB (ref 3.5–5.0)
ALK PHOS: 82 U/L (ref 38–126)
ALT: 24 U/L (ref 17–63)
ANION GAP: 10 (ref 5–15)
AST: 38 U/L (ref 15–41)
BILIRUBIN TOTAL: 1 mg/dL (ref 0.3–1.2)
BUN: 16 mg/dL (ref 6–20)
CALCIUM: 7.9 mg/dL — AB (ref 8.9–10.3)
CHLORIDE: 105 mmol/L (ref 101–111)
CO2: 26 mmol/L (ref 22–32)
Creatinine, Ser: 1.17 mg/dL (ref 0.61–1.24)
GFR calc Af Amer: >60 mL/min (ref >60)
GLUCOSE: 130 mg/dL — AB (ref 65–99)
Potassium: 3.5 mmol/L (ref 3.5–5.1)
Sodium: 141 mmol/L (ref 135–145)
Total Protein: 6 g/dL — ABNORMAL LOW (ref 6.5–8.1)

## 2015-06-14 LAB — ECHOCARDIOGRAM COMPLETE
HEIGHTINCHES: 72 in
WEIGHTICAEL: 5326.31 [oz_av]

## 2015-06-14 LAB — APTT: aPTT: 53 seconds — ABNORMAL HIGH (ref 24–37)

## 2015-06-14 LAB — MAGNESIUM: MAGNESIUM: 2.1 mg/dL (ref 1.7–2.4)

## 2015-06-14 LAB — MRSA PCR SCREENING: MRSA by PCR: NEGATIVE

## 2015-06-14 LAB — CBC
HEMATOCRIT: 40.2 % (ref 39.0–52.0)
HEMOGLOBIN: 12.6 g/dL — AB (ref 13.0–17.0)
MCH: 27.4 pg (ref 26.0–34.0)
MCHC: 31.3 g/dL (ref 30.0–36.0)
MCV: 87.4 fL (ref 78.0–100.0)
Platelets: 180 10*3/uL (ref 150–400)
RBC: 4.6 MIL/uL (ref 4.22–5.81)
RDW: 14.3 % (ref 11.5–15.5)
WBC: 9.8 10*3/uL (ref 4.0–10.5)

## 2015-06-14 LAB — PROTIME-INR
INR: 1.27 (ref 0.00–1.49)
Prothrombin Time: 16 seconds — ABNORMAL HIGH (ref 11.6–15.2)

## 2015-06-14 LAB — HEPARIN LEVEL (UNFRACTIONATED)
Heparin Unfractionated: 0.27 IU/mL — ABNORMAL LOW (ref 0.30–0.70)
Heparin Unfractionated: 0.49 IU/mL (ref 0.30–0.70)

## 2015-06-14 LAB — GLUCOSE, CAPILLARY: GLUCOSE-CAPILLARY: 95 mg/dL (ref 65–99)

## 2015-06-14 MED ORDER — PERFLUTREN LIPID MICROSPHERE
1.0000 mL | INTRAVENOUS | Status: AC | PRN
  Administered 2015-06-14: 3 mL via INTRAVENOUS
  Filled 2015-06-14 (×2): qty 10

## 2015-06-14 MED ORDER — APIXABAN 5 MG PO TABS
10.0000 mg | ORAL_TABLET | Freq: Two times a day (BID) | ORAL | Status: DC
  Administered 2015-06-15 – 2015-06-16 (×3): 10 mg via ORAL
  Filled 2015-06-14 (×3): qty 2

## 2015-06-14 MED ORDER — APIXABAN 5 MG PO TABS: 5.0000 mg | ORAL_TABLET | Freq: Two times a day (BID) | ORAL | Status: DC

## 2015-06-14 MED ORDER — CETYLPYRIDINIUM CHLORIDE 0.05 % MT LIQD
7.0000 mL | Freq: Two times a day (BID) | OROMUCOSAL | Status: DC
  Administered 2015-06-15 – 2015-06-16 (×3): 7 mL via OROMUCOSAL

## 2015-06-14 NOTE — ED Notes (Signed)
Pt's spo2 falling to mid 80s on pt's c-pap from home.....unable to deliver o2 through the cpap, respiratory notified and to bring our c-pap to room and informed me to place pt on 3L [place] until she can get here. Pt is less sob since nebulizer and states "I can breathe better and the pain medicine is working"

## 2015-06-14 NOTE — Progress Notes (Signed)
OT Cancellation Note  Patient Details Name: [name] MRN: [id number] DOB: March 12, 1946   Cancelled Treatment:    Reason Eval/Treat Not Completed: Medical issues which prohibited therapy. Pt with new PE. Heparin began 3/12 @ 1930. Will follow up with pt tomorrow.  [place], OTR/L  [id number] 06/14/2015 06/14/2015, 8:49 AM

## 2015-06-14 NOTE — Progress Notes (Signed)
  Echocardiogram 2D Echocardiogram With Definity has been performed.  Jennette Dubin 06/14/2015, 3:38 PM

## 2015-06-14 NOTE — Progress Notes (Signed)
Preliminary results by tech - Venous Duplex Lower Ext. Completed. No evidence of obvious deep or superficial vein the right or left lower extremities. Oda Cogan, BS, RDMS, RVT

## 2015-06-14 NOTE — Discharge Instructions (Signed)
Information on my medicine - ELIQUIS (apixaban)  This medication education was reviewed with me or my healthcare representative as part of my discharge preparation.  The pharmacist that spoke with me during my hospital stay was:  Dareen Piano, [facility]  Why was Eliquis prescribed for you? Eliquis was prescribed to treat blood clots that may have been found in the veins of your legs (deep vein thrombosis) or in your lungs (pulmonary embolism) and to reduce the risk of them occurring again.  What do You need to know about Eliquis ? The starting dose is 10 mg (two 5 mg tablets) taken TWICE daily for the FIRST SEVEN (7) DAYS, then on 06/22/2015 the dose is reduced to ONE 5 mg tablet taken TWICE daily.  Eliquis may be taken with or without food.   Try to take the dose about the same time in the morning and in the evening. If you have difficulty swallowing the tablet whole please discuss with your pharmacist how to take the medication safely.  Take Eliquis exactly as prescribed and DO NOT stop taking Eliquis without talking to the doctor who prescribed the medication.  Stopping may increase your risk of developing a new blood clot.  Refill your prescription before you run out.  After discharge, you should have regular check-up appointments with your healthcare provider that is prescribing your Eliquis.    What do you do if you miss a dose? If a dose of ELIQUIS is not taken at the scheduled time, take it as soon as possible on the same day and twice-daily administration should be resumed. The dose should not be doubled to make up for a missed dose.  Important Safety Information A possible side effect of Eliquis is bleeding. You should call your healthcare provider right away if you experience any of the following: ? Bleeding from an injury or your nose that does not stop. ? Unusual colored urine (red or dark brown) or unusual colored stools (red or black). ? Unusual bruising for unknown  reasons. ? A serious fall or if you hit your head (even if there is no bleeding).  Some medicines may interact with Eliquis and might increase your risk of bleeding or clotting while on Eliquis. To help avoid this, consult your healthcare provider or pharmacist prior to using any new prescription or non-prescription medications, including herbals, vitamins, non-steroidal anti-inflammatory drugs (NSAIDs) and supplements.  This website has more information on Eliquis (apixaban): http://www.eliquis.com/eliquis/home

## 2015-06-14 NOTE — Progress Notes (Signed)
ANTICOAGULATION CONSULT NOTE - Follow Up Consult  Pharmacy Consult for heparin Indication: pulmonary embolus   Labs:  Recent Labs  06/13/15 1700 06/13/15 1732 06/14/15 0145 06/14/15 0345  HGB 13.4 15.6  --  12.6*  HCT 42.2 46.0  --  40.2  PLT 170  --   --  180  APTT  --   --  53*  --   LABPROT  --   --  16.0*  --   INR  --   --  1.27  --   HEPARINUNFRC  --   --   --  0.27*  CREATININE  --  1.20  --   --      Assessment: 70yo male slightly subtherapeutic on heparin with initial dosing for PE.  Goal of Therapy:  Heparin level 0.3-0.7 units/ml   Plan:  Will increase heparin gtt by 1-2 units/kg/hr to 2000 units/hr and check level in [place], PharmD, BCPS  06/14/2015,4:12 AM

## 2015-06-14 NOTE — Care Management Note (Addendum)
Case Management Note  Patient Details  Name: [name] MRN: [id number] Date of Birth: 1945-05-08  Subjective/Objective:    Adm w pul embolus               Action/Plan: lives w fam, pcp dr dewey   Expected Discharge Date:                  Expected Discharge Plan:  Home/Self Care  In-House Referral:     Discharge planning Services     Post Acute Care Choice:    Choice offered to:     DME Arranged:    DME Agency:     HH Arranged:    [place] Agency:     Status of Service:     Medicare Important Message Given:    Date Medicare IM Given:    Medicare IM give by:    Date Additional Medicare IM Given:    Additional Medicare Important Message give by:     If discussed at [place] of Stay Meetings, dates discussed:    Additional Comments: ur review done, left pt 30day free eliquis card. Pt will be on eliquis at disch. Prior auth [id number] pt will pay 45%of the the drug cost, gave pt pt assist form for eliquis also  Lacretia Leigh, RN 06/14/2015, 9:33 AM

## 2015-06-14 NOTE — Procedures (Signed)
Pt removed from his home cpap and placed on hospital cpap to bleed in oxygen.

## 2015-06-14 NOTE — Progress Notes (Signed)
PT Cancellation Note  Patient Details Name: [name] MRN: [id number] DOB: 02-07-1946   Cancelled Treatment:    Reason Eval/Treat Not Completed: Medical issues which prohibited therapy (Pt has not been on heparin 24 hours until after 1900.) Will return in am.  Thanks.    Irwin Brakeman F 06/14/2015, 10:07 AM [facility] [phone] (pager)

## 2015-06-14 NOTE — Progress Notes (Signed)
[facility] PROGRESS NOTE  [name] [id number] DOB: 1946-01-12 DOA: 06/13/2015 PCP: [name], MD  Length of stay: 1   Assessment/Plan: Principal Problem:   Recurrent pulmonary emboli ([place]) Active Problems:   Depression   HTN (hypertension)   BPH (benign prostatic hyperplasia)   Obstructive sleep apnea   Peripheral neuropathy ([place])   Morbid obesity ([place])   H/O laparoscopic adjustable gastric banding & hiatal hernia repair 04/08/13   Pericardial effusion s/p pericardial window [id number] [id number]   Diastolic congestive heart failure (HCC)   PAF (paroxysmal atrial fibrillation) (HCC)   GERD (gastroesophageal reflux disease)   Asthma   Brief summary [name] is a 70 y.o. male with PMH of pulmonary embolism, hypertension, asthma, GERD, gout, depression, BPH, obesity, OSA on CPAP, PAF not on anticoagulants, diastolic congestive heart failure, skin cancer, pericardial effusion (S/P of pericardial window), upper GI bleeding due to gastric ulcer, who presents with right shoulder blade pain and shortness of breath.  Patient reports that he has been having pain over the right shoulder blade and shortness of breath in the past 4 days. He also has dry cough, but no CP, fever or chills. Patient does not have tenderness over calf areas. No recent long distance traveling. He has generalized weakness and headache.  Patient had pulmonary embolism 2015, which was treated with Xarelto and was switched to Eliquis in the setting of GI bleeding.   . CT angiogram of chest showed submassive PE over RML and RLL with evidence of right heart strain. Patient is admitted to inpatient for further events and treatment.   Assessment and plan Recurrent pulmonary emboli [facility]): CT angiogram of chest showed submassive PE over RML and RLL with evidence of right heart strain. Pt is hemodynamically stable currently, No CP. PCCM was consulted by EDP, and recommended to admit pt to SDU. This  is recurrent PE, pt need lifelong anticoagulation -admit to stepdown for close monitoring overnight -heparin drip  for 24 hours 2-D echo pending -LE dopplers ordered to evaluate for DVT -repeat EKG in a.m. -pain control: When necessary Percocet and tylenol (pt is allergic to morphine).  Atrial Fibrillation: CHA2DS2-VASc Score is 4, needs oral anticoagulation. Patient was on Xarelto in the past which was d/c'ed due to upper GIB from gastric ulcer and he was switched to Eliquis. The repeated EGD on 07/31/13 showed that his gastric ulcer had resolved. Heart rate is well controlled. Pt is on flecainide. QTc=490 -Continue Flecainide -now on IV heparin for PE -repeat EKG in am for monitoring QTc  patient may need to transition to Eliquis after completion of 24 hours of IV heparin  GERD: -ContinueProtonix 40 mg bid, due to history of gastric ulcers and bleeding on anticoagulation   Depression and anxiety: Stable, no suicidal or homicidal ideations. -Continue home medications: Ativan, risperidone, Requip, Zoloft  Asthma: stable -prn Albuterol nebs - Mucinex for cough  HTN: -hold lasix in the setting submassive PE -IV hydralazine when necessary  blood pressure soft  BPH: stable - Continue Proscar  OSA: -CPAP  Chronic diastolic congestive heart failure: 2-D echo on 12/16/13 showed EF 65-70 percent. Patient is on Lasix 20 mg when necessary. Has only trace leg edema and no JVD, CHF is compensated. -Hold Lasix -Check BNP  History of GI bleeding Dr Henrene Pastor was consulted from GI on 4/20 and he underwent endoscopy on 4/21 . He was found to have superficial ulcers in the stomach, underwent biopsy of the area. He  was recommended to continue on PPI twice daily for 2 weeks and resume xarelto.repeat EGD which showed healing of ulcers, no bleed  DVT ppx: on IV Heparin   Code Status:      Code Status Orders        Start     Ordered   06/13/15 2018  Full code   Continuous     06/13/15 2019     Code Status History      Family Communication: Discussed in detail with the patient, all imaging results, lab results explained to the patient   Disposition Plan:   patient admitted to step down      consultations none    Procedures:   None   Antibiotics: Anti-infectives    None         HPI/Subjective:  Denies any chest pain shortness of breath, blood pressure soft, resting comfortably in bed  Objective: Filed Vitals:   06/14/15 0630 06/14/15 0700 06/14/15 0730 06/14/15 0833  BP: 103/61 106/61 102/62 134/63  Pulse: 48 51  54  Temp:    98.3 F (36.8 C)  TempSrc:    Oral  Resp: 24 24 22 23   Height:    6' (1.829 m)  Weight:    151 kg (332 lb 14.3 oz)  SpO2: 94% 94% 94% 95%   No intake or output data in the 24 hours ending 06/14/15 0907  Exam:  General: No acute respiratory distress Lungs: Clear to auscultation bilaterally without wheezes or crackles Cardiovascular: Regular rate and rhythm without murmur gallop or rub normal S1 and S2 Abdomen: Nontender, nondistended, soft, bowel sounds positive, no rebound, no ascites, no appreciable mass Extremities: No significant cyanosis, clubbing, or edema bilateral lower extremities     Data Review   Micro Results No results found for this or any previous visit (from the past 240 hour(s)).  Radiology Reports Dg Chest 2 View  06/13/2015  CLINICAL DATA:  Nonproductive cough. EXAM: CHEST  2 VIEW COMPARISON:  12/15/2013 chest radiograph. FINDINGS: Stable cardiomediastinal silhouette with normal heart size. No pneumothorax. Trace bilateral pleural effusions. No pulmonary edema. There is new patchy consolidation in the peripheral mid to lower right lung with associated pleural thickening. IMPRESSION: 1. New patchy consolidation in the peripheral mid to lower right lung, which could represent a right middle lobe pneumonia in the correct clinical setting. Recommend follow-up PA and lateral post treatment chest  radiographs in 4-6 weeks. 2. Trace bilateral pleural effusions. Electronically Signed   By: Ilona Sorrel M.D.   On: 06/13/2015 16:07   Ct Angio Chest Pe W/cm &/or Wo Cm  06/13/2015  CLINICAL DATA:  Cough weakness and upper back pain. EXAM: CT ANGIOGRAPHY CHEST WITH CONTRAST TECHNIQUE: Multidetector CT imaging of the chest was performed using the standard protocol during bolus administration of intravenous contrast. Multiplanar CT image reconstructions and MIPs were obtained to evaluate the vascular anatomy. CONTRAST:  80 cc Omnipaque 350 COMPARISON:  07/16/2013. FINDINGS: Mediastinum / Lymph Nodes: There is no axillary lymphadenopathy. No mediastinal lymphadenopathy. There is no hilar lymphadenopathy. The heart is at upper normal for size. No pericardial effusion. Coronary artery calcification is noted. The esophagus has normal imaging features.Acute pulmonary embolus is identified in lobar pulmonary arteries to the right middle and lower lobes. Segmental and subsegmental pulmonary embolus is noted in the lower lobes bilaterally. Lungs / Pleura: Wedge-shaped peripheral airspace disease in the right middle lobe is compatible with infarct. There is compressive atelectasis in the lower lobes bilaterally. Tiny right  pleural effusion is evident. Upper Abdomen: Patient is status post laparoscopic banding procedure. Otherwise unremarkable. MSK / Soft Tissues: Bone windows reveal no worrisome lytic or sclerotic osseous lesions. Review of the MIP images confirms the above findings. IMPRESSION: 1. Acute lobar pulmonary embolus to the right middle and lower lobes with segmental and subsegmental pulmonary embolus to the lower lobes bilaterally. Positive for acute PE with CT evidence of right heart strain (RV/LV Ratio = 1.1) consistent with at least submassive (intermediate risk) PE. The presence of right heart strain has been associated with an increased risk of morbidity and mortality. Please activate Code PE by paging  [phone]. 2. Right middle lobe infarct. Critical Value/emergent results were called by me at the time of interpretation on 06/13/2015 at 6:49 pm to Dr. Blanchie Dessert , who verbally acknowledged these results. Electronically Signed   By: Misty Stanley M.D.   On: 06/13/2015 18:49     CBC  Recent Labs Lab 06/13/15 1700 06/13/15 1732 06/14/15 0345  WBC 11.2*  --  9.8  HGB 13.4 15.6 12.6*  HCT 42.2 46.0 40.2  PLT 170  --  180  MCV 87.4  --  87.4  MCH 27.7  --  27.4  MCHC 31.8  --  31.3  RDW 14.1  --  14.3  LYMPHSABS 1.2  --   --   MONOABS 1.4*  --   --   EOSABS 0.1  --   --   BASOSABS 0.0  --   --     Chemistries   Recent Labs Lab 06/13/15 1732 06/14/15 0345  NA 140 141  K 3.4* 3.5  CL 100* 105  CO2  --  26  GLUCOSE 103* 130*  BUN 22* 16  CREATININE 1.20 1.17  CALCIUM  --  7.9*  MG  --  2.1  AST  --  38  ALT  --  24  ALKPHOS  --  82  BILITOT  --  1.0   ------------------------------------------------------------------------------------------------------------------ estimated creatinine clearance is 90.2 mL/min (by C-G formula based on Cr of 1.17). ------------------------------------------------------------------------------------------------------------------ No results for input(s): HGBA1C in the last 72 hours. ------------------------------------------------------------------------------------------------------------------ No results for input(s): CHOL, HDL, LDLCALC, TRIG, CHOLHDL, LDLDIRECT in the last 72 hours. ------------------------------------------------------------------------------------------------------------------ No results for input(s): TSH, T4TOTAL, T3FREE, THYROIDAB in the last 72 hours.  Invalid input(s): FREET3 ------------------------------------------------------------------------------------------------------------------ No results for input(s): VITAMINB12, FOLATE, FERRITIN, TIBC, IRON, RETICCTPCT in the last 72 hours.  Coagulation  profile  Recent Labs Lab 06/14/15 0145  INR 1.27    No results for input(s): DDIMER in the last 72 hours.  Cardiac Enzymes No results for input(s): CKMB, TROPONINI, MYOGLOBIN in the last 168 hours.  Invalid input(s): CK ------------------------------------------------------------------------------------------------------------------ Invalid input(s): POCBNP   CBG:  Recent Labs Lab 06/14/15 0832  GLUCAP 95       Studies: Dg Chest 2 View  06/13/2015  CLINICAL DATA:  Nonproductive cough. EXAM: CHEST  2 VIEW COMPARISON:  12/15/2013 chest radiograph. FINDINGS: Stable cardiomediastinal silhouette with normal heart size. No pneumothorax. Trace bilateral pleural effusions. No pulmonary edema. There is new patchy consolidation in the peripheral mid to lower right lung with associated pleural thickening. IMPRESSION: 1. New patchy consolidation in the peripheral mid to lower right lung, which could represent a right middle lobe pneumonia in the correct clinical setting. Recommend follow-up PA and lateral post treatment chest radiographs in 4-6 weeks. 2. Trace bilateral pleural effusions. Electronically Signed   By: Ilona Sorrel M.D.   On: 06/13/2015 16:07   Ct  Angio Chest Pe W/cm &/or Wo Cm  06/13/2015  CLINICAL DATA:  Cough weakness and upper back pain. EXAM: CT ANGIOGRAPHY CHEST WITH CONTRAST TECHNIQUE: Multidetector CT imaging of the chest was performed using the standard protocol during bolus administration of intravenous contrast. Multiplanar CT image reconstructions and MIPs were obtained to evaluate the vascular anatomy. CONTRAST:  80 cc Omnipaque 350 COMPARISON:  07/16/2013. FINDINGS: Mediastinum / Lymph Nodes: There is no axillary lymphadenopathy. No mediastinal lymphadenopathy. There is no hilar lymphadenopathy. The heart is at upper normal for size. No pericardial effusion. Coronary artery calcification is noted. The esophagus has normal imaging features.Acute pulmonary embolus is  identified in lobar pulmonary arteries to the right middle and lower lobes. Segmental and subsegmental pulmonary embolus is noted in the lower lobes bilaterally. Lungs / Pleura: Wedge-shaped peripheral airspace disease in the right middle lobe is compatible with infarct. There is compressive atelectasis in the lower lobes bilaterally. Tiny right pleural effusion is evident. Upper Abdomen: Patient is status post laparoscopic banding procedure. Otherwise unremarkable. MSK / Soft Tissues: Bone windows reveal no worrisome lytic or sclerotic osseous lesions. Review of the MIP images confirms the above findings. IMPRESSION: 1. Acute lobar pulmonary embolus to the right middle and lower lobes with segmental and subsegmental pulmonary embolus to the lower lobes bilaterally. Positive for acute PE with CT evidence of right heart strain (RV/LV Ratio = 1.1) consistent with at least submassive (intermediate risk) PE. The presence of right heart strain has been associated with an increased risk of morbidity and mortality. Please activate Code PE by paging [phone]. 2. Right middle lobe infarct. Critical Value/emergent results were called by me at the time of interpretation on 06/13/2015 at 6:49 pm to Dr. Blanchie Dessert , who verbally acknowledged these results. Electronically Signed   By: Misty Stanley M.D.   On: 06/13/2015 18:49      Lab Results  Component Value Date   HGBA1C 6.1* 12/05/2012   HGBA1C 5.8 09/11/2012   HGBA1C 5.8 02/20/2012   Lab Results  Component Value Date   MICROALBUR 1.1 12/12/2006   LDLCALC 77 12/05/2012   CREATININE 1.17 06/14/2015       Scheduled Meds: . aspirin EC  81 mg Oral BID  . calcium carbonate  1 tablet Oral TID  . finasteride  5 mg Oral Daily  . flecainide  50 mg Oral Q12H  . loratadine  10 mg Oral Daily  . LORazepam  0.5 mg Oral BID  . multivitamin with minerals  1 tablet Oral Daily  . pantoprazole  40 mg Oral BID  . risperiDONE  0.5 mg Oral QHS  . rOPINIRole   0.75 mg Oral QHS  . sertraline  150 mg Oral Q breakfast  . sodium chloride flush  3 mL Intravenous Q12H  . vitamin B-12  1,000 mcg Oral Daily   Continuous Infusions: . heparin 2,000 Units/hr (06/14/15 0800)    Principal Problem:   Recurrent pulmonary emboli (HCC) Active Problems:   Depression   HTN (hypertension)   BPH (benign prostatic hyperplasia)   Obstructive sleep apnea   Peripheral neuropathy (HCC)   Morbid obesity ([place])   H/O laparoscopic adjustable gastric banding & hiatal hernia repair 04/08/13   Pericardial effusion s/p pericardial window [id number]   Diastolic congestive heart failure (HCC)   PAF (paroxysmal atrial fibrillation) (HCC)   GERD (gastroesophageal reflux disease)   Asthma    Time spent: 45 minutes   [facility] Pager [phone]. If 7PM-7AM, please contact night-coverage  at [email or web address], password [facility] 06/14/2015, 9:07 AM  LOS: 1 day

## 2015-06-14 NOTE — ED Notes (Signed)
Pt is slightly short of breath and is has a non productive cough. Pt also requested something for back pain that occurs while coughing.

## 2015-06-14 NOTE — Progress Notes (Signed)
ANTICOAGULATION CONSULT NOTE - Follow Up Consult  Pharmacy Consult for heparin, apixiban Indication: pulmonary embolus  Allergies  Allergen Reactions  . Morphine Other (See Comments)    Nausea and Severe hallucinations  . Codeine Nausea Only  . Penicillins Other (See Comments)    LOC with shot  . Propoxyphene N-Acetaminophen Nausea Only    Patient Measurements: Height: 6' (182.9 cm) Weight: (!) 332 lb 14.3 oz (151 kg) IBW/kg (Calculated) : 77.6  Vital Signs: Temp: 98.3 F (36.8 C) (03/13 0833) Temp Source: Oral (03/13 0833) BP: 134/63 mmHg (03/13 0833) Pulse Rate: 54 (03/13 0833)  Labs:  Recent Labs  06/13/15 1700 06/13/15 1732 06/14/15 0145 06/14/15 0345 06/14/15 1011  HGB 13.4 15.6  --  12.6*  --   HCT 42.2 46.0  --  40.2  --   PLT 170  --   --  180  --   APTT  --   --  53*  --   --   LABPROT  --   --  16.0*  --   --   INR  --   --  1.27  --   --   HEPARINUNFRC  --   --   --  0.27* 0.49  CREATININE  --  1.20  --  1.17  --     Estimated Creatinine Clearance: 90.2 mL/min (by C-G formula based on Cr of 1.17).   Medications:  Scheduled:  . [START ON 06/15/2015] apixaban  10 mg Oral BID   Followed by  . [START ON 06/22/2015] apixaban  5 mg Oral BID  . aspirin EC  81 mg Oral BID  . calcium carbonate  1 tablet Oral TID  . finasteride  5 mg Oral Daily  . flecainide  50 mg Oral Q12H  . loratadine  10 mg Oral Daily  . LORazepam  0.5 mg Oral BID  . multivitamin with minerals  1 tablet Oral Daily  . pantoprazole  40 mg Oral BID  . risperiDONE  0.5 mg Oral QHS  . rOPINIRole  0.75 mg Oral QHS  . sertraline  150 mg Oral Q breakfast  . sodium chloride flush  3 mL Intravenous Q12H  . vitamin B-12  1,000 mcg Oral Daily    Assessment: 71 yo male with PE on heparin. Heparin level is at goal (HL= 0.49), Hg= 12.6 and plt= 180. Pharmacy consulted to dose apixiban beginning 3/14. Patient is noted with a history of GIB due to a gastric ulcer (ulcer was healed on 07/2013  EGD)  Goal of Therapy:  Heparin level 0.3-0.7 units/ml Monitor platelets by anticoagulation protocol: Yes   Plan:  -no heparin changes needed -Daily CBC and HL -apixiban 10mg  po bid for 7 days then 5mg  po bid (3/21>>) -Consider d/c aspirin?  Hildred Laser, Pharm D 06/14/2015 11:23 AM

## 2015-06-14 NOTE — Progress Notes (Signed)
Patient arrived on unit alert and oriented. Bp 134/63 HR 55  RR 22 Sats 98% on 2L [place]. Heparin gtt infusing. Patient belongings placed in closet.

## 2015-06-15 ENCOUNTER — Ambulatory Visit (HOSPITAL_COMMUNITY): Payer: Self-pay | Admitting: Psychiatry

## 2015-06-15 LAB — CBC
HEMATOCRIT: 40.3 % (ref 39.0–52.0)
HEMOGLOBIN: 12.4 g/dL — AB (ref 13.0–17.0)
MCH: 27.6 pg (ref 26.0–34.0)
MCHC: 30.8 g/dL (ref 30.0–36.0)
MCV: 89.8 fL (ref 78.0–100.0)
Platelets: 193 10*3/uL (ref 150–400)
RBC: 4.49 MIL/uL (ref 4.22–5.81)
RDW: 14.3 % (ref 11.5–15.5)
WBC: 9.1 10*3/uL (ref 4.0–10.5)

## 2015-06-15 LAB — HEPARIN LEVEL (UNFRACTIONATED): Heparin Unfractionated: 0.44 IU/mL (ref 0.30–0.70)

## 2015-06-15 LAB — COMPREHENSIVE METABOLIC PANEL
ALBUMIN: 2.7 g/dL — AB (ref 3.5–5.0)
ALK PHOS: 91 U/L (ref 38–126)
ALT: 24 U/L (ref 17–63)
ANION GAP: 12 (ref 5–15)
AST: 27 U/L (ref 15–41)
BUN: 16 mg/dL (ref 6–20)
CALCIUM: 8.1 mg/dL — AB (ref 8.9–10.3)
CHLORIDE: 104 mmol/L (ref 101–111)
CO2: 27 mmol/L (ref 22–32)
CREATININE: 1.37 mg/dL — AB (ref 0.61–1.24)
GFR calc Af Amer: 59 mL/min — ABNORMAL LOW (ref >60)
GFR calc non Af Amer: 51 mL/min — ABNORMAL LOW (ref >60)
GLUCOSE: 95 mg/dL (ref 65–99)
Potassium: 4.1 mmol/L (ref 3.5–5.1)
SODIUM: 143 mmol/L (ref 135–145)
Total Bilirubin: 1.3 mg/dL — ABNORMAL HIGH (ref 0.3–1.2)
Total Protein: 5.9 g/dL — ABNORMAL LOW (ref 6.5–8.1)

## 2015-06-15 LAB — GLUCOSE, CAPILLARY: Glucose-Capillary: 79 mg/dL (ref 65–99)

## 2015-06-15 MED ORDER — IOHEXOL 350 MG/ML SOLN
80.0000 mL | Freq: Once | INTRAVENOUS | Status: AC | PRN
  Administered 2015-06-13: 80 mL via INTRAVENOUS

## 2015-06-15 NOTE — Progress Notes (Signed)
Physical Therapy Treatment Note  Clinical Impression: SATURATION QUALIFICATIONS: (This note is used to comply with regulatory documentation for home oxygen)  Patient Saturations on Room Air at Rest = 92%  Patient Saturations on Room Air while Ambulating = 83%  Patient Saturations on 4 Liters of oxygen while Ambulating = 96%  Please briefly explain why patient needs home oxygen: Patient requires supplemental oxygen to maintain oxygen saturations at acceptable, safe levels with physical activity.  Roney [place], [place]  Acute Rehabilitation Services Pager [phone] Office [phone]

## 2015-06-15 NOTE — Clinical Documentation Improvement (Signed)
Hospitalist  Would you please clarify if there is correlation between obstructive sleep apnea and morbid obesity?  Morbid obesity with alveolar hypotension  Other  Clinically Undetermined  Supporting Information: Pt has both diagnoses as well as CHF and atrial fib.   Please exercise your independent, professional judgment when responding. A specific answer is not anticipated or expected.   Thank You, [place] [phone]

## 2015-06-15 NOTE — Progress Notes (Signed)
Patient arrived to 2w39 via wheelchair.  Alert and oriented X and in no distress. Denies pain.  Pt placed on tele.  SR noted. VSS.  Will continue to monitor.

## 2015-06-15 NOTE — Progress Notes (Signed)
Transferred to [place] by wheelchair, stable, belongings with pt. Report given to RN.

## 2015-06-15 NOTE — Progress Notes (Signed)
[facility] PROGRESS NOTE  FREMON OHLMAN [id number] DOB: Apr 17, 1945 DOA: 06/13/2015 PCP: [name], MD  Length of stay: 2   Assessment/Plan: Principal Problem:   Recurrent pulmonary emboli ([place]) Active Problems:   Depression   HTN (hypertension)   BPH (benign prostatic hyperplasia)   Obstructive sleep apnea   Peripheral neuropathy ([place])   Morbid obesity ([place])   H/O laparoscopic adjustable gastric banding & hiatal hernia repair 04/08/13   Pericardial effusion s/p pericardial window 123XX123 [id number]   Diastolic congestive heart failure (HCC)   PAF (paroxysmal atrial fibrillation) (HCC)   GERD (gastroesophageal reflux disease)   Asthma   Brief summary ITSUO PACIFICO is a 70 y.o. male with PMH of pulmonary embolism, hypertension, asthma, GERD, gout, depression, BPH, obesity, OSA on CPAP, PAF not on anticoagulants, diastolic congestive heart failure, skin cancer, pericardial effusion (S/P of pericardial window), upper GI bleeding due to gastric ulcer, who presents with right shoulder blade pain and shortness of breath.  Patient reports that he has been having pain over the right shoulder blade and shortness of breath in the past 4 days. He also has dry cough, but no CP, fever or chills. Patient does not have tenderness over calf areas. No recent long distance traveling. He has generalized weakness and headache.  Patient had pulmonary embolism 2015, which was treated with Xarelto and was switched to Eliquis in the setting of GI bleeding. CT angiogram of chest showed submassive PE over RML and RLL with evidence of right heart strain. Patient is admitted to inpatient for further events and treatment.    Assessment and plan Recurrent pulmonary emboli [facility]): CT angiogram of chest showed submassive PE over RML and RLL with evidence of right heart strain. Pt is hemodynamically stable currently, No CP. PCCM was consulted by EDP, and recommended to admit pt to SDU. This is  recurrent PE, pt need lifelong anticoagulation. He was initially admitted to stepdown for close monitoring overnight. Started on IV heparin and transitioned him to oral eliquis. His ambulating oxygen levels were low on RA and he requires nasal canula oxygen upto 4 liters to keep sats greater than 90%. Echocardiogram showed mild focal basal hypertrophy, with normal LVEF and moderately dilated right and left atrium . Right ventricular systolic function is normal. LE dopplers negative for DVT.  Atrial Fibrillation: CHA2DS2-VASc Score is 4, needs oral anticoagulation. Patient was on Xarelto in the past which was d/c'ed due to upper GIB from gastric ulcer and he was switched to Eliquis. The repeated EGD on 07/31/13 showed that his gastric ulcer had resolved. Heart rate is well controlled. Pt is on flecainide. QTc=490 -Continue Flecainide -paroxysmal , started on IV heparin , later transitioned to eliquis -repeat EKG in am for monitoring QTc   GERD: -ContinueProtonix 40 mg bid, due to history of gastric ulcers and bleeding on anticoagulation   Depression and anxiety: Stable, no suicidal or homicidal ideations. -Continue home medications: Ativan, risperidone, Requip, Zoloft  Asthma: stable -prn Albuterol nebs - Mucinex for cough  HTN: Soft bp , holding lasix. Prn hydralazine.   BPH: stable - Continue Proscar  OSA: -CPAP  Chronic diastolic congestive heart failure: 2-D echo on 12/16/13 showed EF 65-70 percent. Patient is on Lasix 20 mg when necessary. Has only trace leg edema and no JVD, CHF is compensated. -appears to be compensated.  History of GI bleeding Dr Henrene Pastor was consulted from GI on 4/20 and he underwent endoscopy on 4/21 . He was found  to have superficial ulcers in the stomach, underwent biopsy of the area. He was recommended to continue on PPI twice daily for 2 weeks and resume xarelto.repeat EGD which showed healing of ulcers, no bleed  DVT ppx: on eliquis  Code Status:       Code Status Orders        Start     Ordered   06/13/15 2018  Full code   Continuous     06/13/15 2019    Code Status History      Family Communication: Discussed in detail with the patient, all imaging results, lab results explained to the patient   Disposition Plan: transfer tele metry tonight and plan for discharge in am after ambulating oxygen levels.       Consultations  none    Procedures:   None   Antibiotics: Anti-infectives    None         HPI/Subjective: Sitting in the chair, with family.  Cheerful.   Objective: Filed Vitals:   06/15/15 0806 06/15/15 1000 06/15/15 1207 06/15/15 1645  BP:  132/63 117/72 96/81  Pulse:  60 69 57  Temp: 98.8 F (37.1 C)  98.3 F (36.8 C) 98.4 F (36.9 C)  TempSrc: Oral  Axillary Oral  Resp:  21 15 15   Height:      Weight:      SpO2:  94% 97% 98%    Intake/Output Summary (Last 24 hours) at 06/15/15 1716 Last data filed at 06/15/15 1642  Gross per 24 hour  Intake    810 ml  Output    700 ml  Net    110 ml    Exam:  General: No acute respiratory distress Lungs: Clear to auscultation bilaterally without wheezes or crackles Cardiovascular: Regular rate and rhythm without murmur gallop or rub normal S1 and S2 Abdomen: Nontender, nondistended, soft, bowel sounds positive, no rebound, no ascites, no appreciable mass Extremities: No significant cyanosis, clubbing, or edema bilateral lower extremities     Data Review   Micro Results Recent Results (from the past 240 hour(s))  MRSA PCR Screening     Status: None   Collection Time: 06/14/15  8:26 AM  Result Value Ref Range Status   MRSA by PCR NEGATIVE NEGATIVE Final    Comment:        The GeneXpert MRSA Assay (FDA approved for NASAL specimens only), is one component of a comprehensive MRSA colonization surveillance program. It is not intended to diagnose MRSA infection nor to guide or monitor treatment for MRSA infections.     Radiology  Reports Dg Chest 2 View  06/13/2015  CLINICAL DATA:  Nonproductive cough. EXAM: CHEST  2 VIEW COMPARISON:  12/15/2013 chest radiograph. FINDINGS: Stable cardiomediastinal silhouette with normal heart size. No pneumothorax. Trace bilateral pleural effusions. No pulmonary edema. There is new patchy consolidation in the peripheral mid to lower right lung with associated pleural thickening. IMPRESSION: 1. New patchy consolidation in the peripheral mid to lower right lung, which could represent a right middle lobe pneumonia in the correct clinical setting. Recommend follow-up PA and lateral post treatment chest radiographs in 4-6 weeks. 2. Trace bilateral pleural effusions. Electronically Signed   By: Ilona Sorrel M.D.   On: 06/13/2015 16:07   Ct Angio Chest Pe W/cm &/or Wo Cm  06/13/2015  CLINICAL DATA:  Cough weakness and upper back pain. EXAM: CT ANGIOGRAPHY CHEST WITH CONTRAST TECHNIQUE: Multidetector CT imaging of the chest was performed using the standard protocol during bolus  administration of intravenous contrast. Multiplanar CT image reconstructions and MIPs were obtained to evaluate the vascular anatomy. CONTRAST:  80 cc Omnipaque 350 COMPARISON:  07/16/2013. FINDINGS: Mediastinum / Lymph Nodes: There is no axillary lymphadenopathy. No mediastinal lymphadenopathy. There is no hilar lymphadenopathy. The heart is at upper normal for size. No pericardial effusion. Coronary artery calcification is noted. The esophagus has normal imaging features.Acute pulmonary embolus is identified in lobar pulmonary arteries to the right middle and lower lobes. Segmental and subsegmental pulmonary embolus is noted in the lower lobes bilaterally. Lungs / Pleura: Wedge-shaped peripheral airspace disease in the right middle lobe is compatible with infarct. There is compressive atelectasis in the lower lobes bilaterally. Tiny right pleural effusion is evident. Upper Abdomen: Patient is status post laparoscopic banding procedure.  Otherwise unremarkable. MSK / Soft Tissues: Bone windows reveal no worrisome lytic or sclerotic osseous lesions. Review of the MIP images confirms the above findings. IMPRESSION: 1. Acute lobar pulmonary embolus to the right middle and lower lobes with segmental and subsegmental pulmonary embolus to the lower lobes bilaterally. Positive for acute PE with CT evidence of right heart strain (RV/LV Ratio = 1.1) consistent with at least submassive (intermediate risk) PE. The presence of right heart strain has been associated with an increased risk of morbidity and mortality. Please activate Code PE by paging [phone]. 2. Right middle lobe infarct. Critical Value/emergent results were called by me at the time of interpretation on 06/13/2015 at 6:49 pm to Dr. Blanchie Dessert , who verbally acknowledged these results. Electronically Signed   By: Misty Stanley M.D.   On: 06/13/2015 18:49     CBC  Recent Labs Lab 06/13/15 1700 06/13/15 1732 06/14/15 0345 06/15/15 0250  WBC 11.2*  --  9.8 9.1  HGB 13.4 15.6 12.6* 12.4*  HCT 42.2 46.0 40.2 40.3  PLT 170  --  180 193  MCV 87.4  --  87.4 89.8  MCH 27.7  --  27.4 27.6  MCHC 31.8  --  31.3 30.8  RDW 14.1  --  14.3 14.3  LYMPHSABS 1.2  --   --   --   MONOABS 1.4*  --   --   --   EOSABS 0.1  --   --   --   BASOSABS 0.0  --   --   --     Chemistries   Recent Labs Lab 06/13/15 1732 06/14/15 0345 06/15/15 0250  NA 140 141 143  K 3.4* 3.5 4.1  CL 100* 105 104  CO2  --  26 27  GLUCOSE 103* 130* 95  BUN 22* 16 16  CREATININE 1.20 1.17 1.37*  CALCIUM  --  7.9* 8.1*  MG  --  2.1  --   AST  --  38 27  ALT  --  24 24  ALKPHOS  --  82 91  BILITOT  --  1.0 1.3*   ------------------------------------------------------------------------------------------------------------------ estimated creatinine clearance is 77 mL/min (by C-G formula based on Cr of  1.37). ------------------------------------------------------------------------------------------------------------------ No results for input(s): HGBA1C in the last 72 hours. ------------------------------------------------------------------------------------------------------------------ No results for input(s): CHOL, HDL, LDLCALC, TRIG, CHOLHDL, LDLDIRECT in the last 72 hours. ------------------------------------------------------------------------------------------------------------------ No results for input(s): TSH, T4TOTAL, T3FREE, THYROIDAB in the last 72 hours.  Invalid input(s): FREET3 ------------------------------------------------------------------------------------------------------------------ No results for input(s): VITAMINB12, FOLATE, FERRITIN, TIBC, IRON, RETICCTPCT in the last 72 hours.  Coagulation profile  Recent Labs Lab 06/14/15 0145  INR 1.27    No results for input(s): DDIMER in the last 72  hours.  Cardiac Enzymes No results for input(s): CKMB, TROPONINI, MYOGLOBIN in the last 168 hours.  Invalid input(s): CK ------------------------------------------------------------------------------------------------------------------ Invalid input(s): POCBNP   CBG:  Recent Labs Lab 06/14/15 0832 06/15/15 0805  GLUCAP 95 79       Studies: Ct Angio Chest Pe W/cm &/or Wo Cm  06/13/2015  CLINICAL DATA:  Cough weakness and upper back pain. EXAM: CT ANGIOGRAPHY CHEST WITH CONTRAST TECHNIQUE: Multidetector CT imaging of the chest was performed using the standard protocol during bolus administration of intravenous contrast. Multiplanar CT image reconstructions and MIPs were obtained to evaluate the vascular anatomy. CONTRAST:  80 cc Omnipaque 350 COMPARISON:  07/16/2013. FINDINGS: Mediastinum / Lymph Nodes: There is no axillary lymphadenopathy. No mediastinal lymphadenopathy. There is no hilar lymphadenopathy. The heart is at upper normal for size. No pericardial  effusion. Coronary artery calcification is noted. The esophagus has normal imaging features.Acute pulmonary embolus is identified in lobar pulmonary arteries to the right middle and lower lobes. Segmental and subsegmental pulmonary embolus is noted in the lower lobes bilaterally. Lungs / Pleura: Wedge-shaped peripheral airspace disease in the right middle lobe is compatible with infarct. There is compressive atelectasis in the lower lobes bilaterally. Tiny right pleural effusion is evident. Upper Abdomen: Patient is status post laparoscopic banding procedure. Otherwise unremarkable. MSK / Soft Tissues: Bone windows reveal no worrisome lytic or sclerotic osseous lesions. Review of the MIP images confirms the above findings. IMPRESSION: 1. Acute lobar pulmonary embolus to the right middle and lower lobes with segmental and subsegmental pulmonary embolus to the lower lobes bilaterally. Positive for acute PE with CT evidence of right heart strain (RV/LV Ratio = 1.1) consistent with at least submassive (intermediate risk) PE. The presence of right heart strain has been associated with an increased risk of morbidity and mortality. Please activate Code PE by paging [phone]. 2. Right middle lobe infarct. Critical Value/emergent results were called by me at the time of interpretation on 06/13/2015 at 6:49 pm to Dr. Blanchie Dessert , who verbally acknowledged these results. Electronically Signed   By: Misty Stanley M.D.   On: 06/13/2015 18:49      Lab Results  Component Value Date   HGBA1C 6.1* 12/05/2012   HGBA1C 5.8 09/11/2012   HGBA1C 5.8 02/20/2012   Lab Results  Component Value Date   MICROALBUR 1.1 12/12/2006   LDLCALC 77 12/05/2012   CREATININE 1.37* 06/15/2015       Scheduled Meds: . antiseptic oral rinse  7 mL Mouth Rinse BID  . apixaban  10 mg Oral BID   Followed by  . [START ON 06/22/2015] apixaban  5 mg Oral BID  . aspirin EC  81 mg Oral BID  . calcium carbonate  1 tablet Oral TID  .  finasteride  5 mg Oral Daily  . flecainide  50 mg Oral Q12H  . loratadine  10 mg Oral Daily  . LORazepam  0.5 mg Oral BID  . multivitamin with minerals  1 tablet Oral Daily  . pantoprazole  40 mg Oral BID  . risperiDONE  0.5 mg Oral QHS  . rOPINIRole  0.75 mg Oral QHS  . sertraline  150 mg Oral Q breakfast  . sodium chloride flush  3 mL Intravenous Q12H  . vitamin B-12  1,000 mcg Oral Daily   Continuous Infusions:    Principal Problem:   Recurrent pulmonary emboli (HCC) Active Problems:   Depression   HTN (hypertension)   BPH (benign prostatic hyperplasia)   Obstructive sleep  apnea   Peripheral neuropathy (HCC)   Morbid obesity ([place])   H/O laparoscopic adjustable gastric banding & hiatal hernia repair 04/08/13   Pericardial effusion s/p pericardial window [id number]   Diastolic congestive heart failure (HCC)   PAF (paroxysmal atrial fibrillation) (HCC)   GERD (gastroesophageal reflux disease)   Asthma    Time spent: 30 minutes   [facility] Pager [phone]. If 7PM-7AM, please contact night-coverage at [email or web address], password [facility] 06/15/2015, 5:16 PM  LOS: 2 days

## 2015-06-15 NOTE — Evaluation (Signed)
Physical Therapy Evaluation Patient Details Name: [name] MRN: [id number] DOB: 1946-03-18 Today's Date: 06/15/2015   History of Present Illness  [name] is a 70 y.o. male with PMH of pulmonary embolism, hypertension, asthma, GERD, gout, depression, BPH, obesity, OSA on CPAP, PAF not on anticoagulants, diastolic congestive heart failure, skin cancer, pericardial effusion (S/P of pericardial window), upper GI bleeding due to gastric ulcer, who presents with right shoulder blade pain and shortness of breath. . CT angiogram of chest showed submassive PE over RML and RLL with evidence of right heart strain.  Clinical Impression   Pt admitted with above diagnosis. Pt currently with functional limitations due to the deficits listed below (see PT Problem List). Overall, [name] ambulates well with RW and is safe at self-monitoring himself for activity tolerance; Patient requires supplemental oxygen to maintain oxygen saturations at acceptable, safe levels with physical activity.  Pt will benefit from skilled PT to increase their independence and safety with mobility to allow discharge to the venue listed below.       Follow Up Recommendations Home health PT;Supervision - Intermittent (May progress well enough to not need HHPT)    Equipment Recommendations  None recommended by PT (I believe he is well-equipped from previous TKA)    Recommendations for Other Services Other (comment) (Does he qualify for Cardiac Rehab phase 1?)     Precautions / Restrictions Precautions Precaution Comments: Monitor O2 sats Restrictions Weight Bearing Restrictions: No      Mobility  Bed Mobility Overal bed mobility: Needs Assistance Bed Mobility: Supine to Sit     Supine to sit: Supervision     General bed mobility comments: Cues to self-monitor for activity tolerance  Transfers Overall transfer level: Needs assistance Equipment used: None Transfers: Sit to/from Stand Sit to  Stand: Min guard         General transfer comment: [place] for safety; Somewhat lightheaded at initial stand  Ambulation/Gait Ambulation/Gait assistance: Min guard;Supervision Ambulation Distance (Feet): 140 Feet Assistive device: Rolling walker (2 wheeled) Gait Pattern/deviations: Step-through pattern     General Gait Details: Cues to self-monitor for activity tolerance; Initial walk on Room Air, however pt did desat to 83%, and required O2 at 4 L to keep O2 sats above 92% with activity  Stairs            Wheelchair Mobility    Modified Rankin (Stroke Patients Only)       Balance                                             Pertinent Vitals/Pain Pain Assessment: Faces Faces Pain Scale: Hurts little more Pain Location: Pain in lungs when coughs; was premedicated for pain Pain Descriptors / Indicators: Grimacing Pain Intervention(s): Monitored during session;Premedicated before session    [place] expects to be discharged to:: Private residence Living Arrangements: Spouse/significant other Available Help at Discharge: Family;Available 24 hours/day Type of Home: House Home Access: Stairs to enter Entrance Stairs-Rails: Psychiatric nurse of Steps: 8 Home Layout: One level Home Equipment: Walker - 2 wheels;Cane - quad      Prior Function Level of Independence: Independent               Hand Dominance   Dominant Hand: Right    Extremity/Trunk Assessment   Upper Extremity Assessment: Overall WFL for tasks assessed  Lower Extremity Assessment: Generalized weakness (mild weakness, related to being in bed for past 24 hours)         Communication   Communication: No difficulties  Cognition Arousal/Alertness: Awake/alert Behavior During Therapy: WFL for tasks assessed/performed Overall Cognitive Status: Within Functional Limits for tasks assessed                       General Comments      Exercises        Assessment/Plan    PT Assessment Patient needs continued PT services  PT Diagnosis Difficulty walking   PT Problem List Decreased activity tolerance;Decreased strength;Decreased balance;Decreased mobility;Cardiopulmonary status limiting activity;Pain  PT Treatment Interventions DME instruction;Gait training;Stair training;Functional mobility training;Therapeutic activities;Therapeutic exercise;Patient/family education   PT Goals (Current goals can be found in the Care Plan section) Acute Rehab PT Goals Patient Stated Goal: to get better PT Goal Formulation: With patient Time For Goal Achievement: 06/29/15 Potential to Achieve Goals: Good    Frequency Min 3X/week   Barriers to discharge        Co-evaluation               End of Session Equipment Utilized During Treatment: Oxygen Activity Tolerance: Patient tolerated treatment well Patient left: in chair;with call bell/phone within reach Nurse Communication: Mobility status         Time: [id number] PT Time Calculation (min) (ACUTE ONLY): 32 min   Charges:   PT Evaluation $PT Eval Moderate Complexity: 1 Procedure PT Treatments $Gait Training: 8-22 mins   PT G Codes:        Roney Marion Hamff 06/15/2015, 11:01 AM  Roney Marion, [place] Pager [phone] Office [phone]

## 2015-06-15 NOTE — Evaluation (Signed)
Occupational Therapy Evaluation Patient Details Name: [name] MRN: [id number] DOB: [birth year] Today's Date: 06/15/2015    History of Present Illness [name] is a 70 y.o. male with PMH of pulmonary embolism, hypertension, asthma, GERD, gout, depression, BPH, obesity, OSA on CPAP, PAF not on anticoagulants, diastolic congestive heart failure, skin cancer, pericardial effusion (S/P of pericardial window), upper GI bleeding due to gastric ulcer, who presents with right shoulder blade pain and shortness of breath. . CT angiogram of chest showed submassive PE over RML and RLL with evidence of right heart strain.   Clinical Impression   PT admitted with PE. Pt currently with functional limitiations due to the deficits listed below (see OT problem list). PTA independent but with x2 falls from bed. Pt will benefit from skilled OT to increase their independence and safety with adls and balance to allow discharge [place].     Follow Up Recommendations  Home health OT    Equipment Recommendations  Other (comment) (tba)    Recommendations for Other Services       Precautions / Restrictions Precautions Precaution Comments: Monitor O2 sats      Mobility Bed Mobility               General bed mobility comments: in chair on arrival  Transfers Overall transfer level: Needs assistance   Transfers: Sit to/from Stand Sit to Stand: Min guard         General transfer comment: bracing and reaching for UE supports    Balance Overall balance assessment: Needs assistance         Standing balance support: Bilateral upper extremity supported;During functional activity Standing balance-Leahy Scale: Poor                              ADL Overall ADL's : Needs assistance/impaired                     Lower Body Dressing: Supervision/safety;Sitting/lateral leans Lower Body Dressing Details (indicate cue type and reason): able to sit and bring LE  toward core but requires sitting EOB to do it with a lateral sitting posture Toilet Transfer: Min Designer, jewellery Details (indicate cue type and reason): pt with bil Le braced against chair surface ankle strategies and reaching for enironmental supports. pt encouraged to use RW but pt states "ill have to think about it"            General ADL Comments: pt educated extensively on energy conservation and application to adls. pt wife and son present. family reports falling out of the bed x2 in the last month due to dreams related to medications.     Vision     Perception     Praxis      Pertinent Vitals/Pain       Hand Dominance Right   Extremity/Trunk Assessment Upper Extremity Assessment Upper Extremity Assessment: Overall WFL for tasks assessed   Lower Extremity Assessment Lower Extremity Assessment: Defer to PT evaluation   Cervical / Trunk Assessment Cervical / Trunk Assessment: Normal   Communication Communication Communication: No difficulties   Cognition Arousal/Alertness: Awake/alert Behavior During Therapy: WFL for tasks assessed/performed Overall Cognitive Status: Within Functional Limits for tasks assessed                     General Comments       Exercises       Shoulder Instructions  Home Living Family/patient expects to be discharged to:: Private residence Living Arrangements: Spouse/significant other Available Help at Discharge: Family;Available 24 hours/day Type of Home: House Home Access: Stairs to enter [employer] of Steps: 8 Entrance Stairs-Rails: Right;Left Home Layout: One level     Bathroom Shower/Tub: Curtain;Tub/shower unit Shower/tub characteristics: Curtain Biochemist, clinical: Standard     Home Equipment: Environmental consultant - 2 wheels;Cane - quad          Prior Functioning/Environment Level of Independence: Independent             OT Diagnosis: Generalized weakness   OT Problem List:  Decreased strength;Decreased activity tolerance;Impaired balance (sitting and/or standing);Decreased safety awareness;Decreased knowledge of precautions;Decreased knowledge of use of DME or AE;Cardiopulmonary status limiting activity;Obesity   OT Treatment/Interventions: Self-care/ADL training;Therapeutic exercise;DME and/or AE instruction;Therapeutic activities;Cognitive remediation/compensation;Patient/family education;Balance training;Energy conservation    OT Goals(Current goals can be found in the care plan section) Acute Rehab OT Goals Patient Stated Goal: to get better OT Goal Formulation: With patient/family Time For Goal Achievement: 06/29/15 Potential to Achieve Goals: Good  OT Frequency: Min 2X/week   Barriers to D/C:            Co-evaluation              End of Session Equipment Utilized During Treatment: Oxygen Nurse Communication: Mobility status;Precautions  Activity Tolerance: Patient tolerated treatment well Patient left: in chair;with call bell/phone within reach;with family/visitor present   Time: 1330-1430 OT Time Calculation (min): 60 min Charges:  OT General Charges $OT Visit: 1 Procedure OT Evaluation $OT Eval Moderate Complexity: 1 Procedure OT Treatments $Self Care/Home Management : 38-52 mins G-Codes:    Parke Poisson B 07/15/15, 2:47 PM  Jeri Modena   OTR/L [phone] Office: [phone] .

## 2015-06-16 ENCOUNTER — Telehealth: Payer: Self-pay | Admitting: Internal Medicine

## 2015-06-16 DIAGNOSIS — I10 Essential (primary) hypertension: Secondary | ICD-10-CM

## 2015-06-16 DIAGNOSIS — F329 Major depressive disorder, single episode, unspecified: Secondary | ICD-10-CM

## 2015-06-16 DIAGNOSIS — N400 Enlarged prostate without lower urinary tract symptoms: Secondary | ICD-10-CM

## 2015-06-16 DIAGNOSIS — I2699 Other pulmonary embolism without acute cor pulmonale: Secondary | ICD-10-CM

## 2015-06-16 LAB — GLUCOSE, CAPILLARY: GLUCOSE-CAPILLARY: 72 mg/dL (ref 65–99)

## 2015-06-16 LAB — CBC
HEMATOCRIT: 38.5 % — AB (ref 39.0–52.0)
Hemoglobin: 11.9 g/dL — ABNORMAL LOW (ref 13.0–17.0)
MCH: 27.6 pg (ref 26.0–34.0)
MCHC: 30.9 g/dL (ref 30.0–36.0)
MCV: 89.3 fL (ref 78.0–100.0)
PLATELETS: 197 10*3/uL (ref 150–400)
RBC: 4.31 MIL/uL (ref 4.22–5.81)
RDW: 14 % (ref 11.5–15.5)
WBC: 8.7 10*3/uL (ref 4.0–10.5)

## 2015-06-16 LAB — BASIC METABOLIC PANEL
Anion gap: 9 (ref 5–15)
BUN: 19 mg/dL (ref 6–20)
CALCIUM: 8 mg/dL — AB (ref 8.9–10.3)
CO2: 28 mmol/L (ref 22–32)
CREATININE: 1.39 mg/dL — AB (ref 0.61–1.24)
Chloride: 104 mmol/L (ref 101–111)
GFR calc Af Amer: 58 mL/min — ABNORMAL LOW (ref >60)
GFR, EST NON AFRICAN AMERICAN: 50 mL/min — AB (ref >60)
Glucose, Bld: 96 mg/dL (ref 65–99)
POTASSIUM: 3.9 mmol/L (ref 3.5–5.1)
SODIUM: 141 mmol/L (ref 135–145)

## 2015-06-16 MED ORDER — APIXABAN 5 MG PO TABS: 10.0000 mg | ORAL_TABLET | Freq: Two times a day (BID) | ORAL | Status: DC

## 2015-06-16 MED ORDER — APIXABAN 5 MG PO TABS: 5.0000 mg | ORAL_TABLET | Freq: Two times a day (BID) | ORAL | Status: DC

## 2015-06-16 NOTE — Care Management Note (Signed)
Case Management Note Previous CM note initiated by Donne Anon RN, CM  Patient Details  Name: [name] MRN: [id number] Date of Birth: February 15, 1946  Subjective/Objective:    Adm w pul embolus               Action/Plan: lives w fam, pcp dr dewey   Expected Discharge Date:     06/16/15             Expected Discharge Plan:  Home/Self Care  In-House Referral:     Discharge planning Services  CM Consult, Medication Assistance  Post Acute Care Choice:  Home Health Choice offered to:  Patient, Spouse  DME Arranged:    DME Agency:     HH Arranged:  PT, Patient Refused [place] Agency:     Status of Service:  Completed, signed off  Medicare Important Message Given:  Yes Date Medicare IM Given:    Medicare IM give by:    Date Additional Medicare IM Given:    Additional Medicare Important Message give by:     If discussed at [place] of Stay Meetings, dates discussed:    Additional Comments: ur review done, left pt 30day free eliquis card. Pt will be on eliquis at disch. Prior auth [id number]- pt will pay 45%of the the drug cost, gave pt pt assist form for eliquis also   06/16/15- 1130- Marvetta Gibbons RN, BSN- pt for possible d/c later today- order for HHPT placed- discussed with pt/wife/son at bedside HHPT- per pt he feels like he is making good progress towards baseline- states he walked the halls today less short of breath and without 02 this AM- explained that he could f/u with PCP if he got home and changed his mind- also gave pt this CM contact info if needed. Pt going home on Eliquis- has 30 day free card- has been on Eliquis in past and will f/u with PCP regarding pre-auth and paperwork needed.  Dawayne Patricia, RN 06/16/2015, 2:33 PM

## 2015-06-16 NOTE — Progress Notes (Signed)
Discharge Note:  Patient alert and oriented X 4 and in no distress.  Patient and his wife given discharge instructions regarding signs and symptoms to report, medications, activity, diet, and upcoming appointments.  Patient verbalized understanding of all instructions.  Telemetry and peripheral IV discontinued.  Patient transported out via wheelchair by nurse tech.

## 2015-06-16 NOTE — Progress Notes (Signed)
Physical Therapy Treatment Patient Details Name: [name] MRN: [id number] DOB: 06/15/1945 Today's Date: 06/16/2015    History of Present Illness [name] is a 70 y.o. male with PMH of pulmonary embolism, hypertension, asthma, GERD, gout, depression, BPH, obesity, OSA on CPAP, PAF not on anticoagulants, diastolic congestive heart failure, skin cancer, pericardial effusion (S/P of pericardial window), upper GI bleeding due to gastric ulcer, who presents with right shoulder blade pain and shortness of breath. . CT angiogram of chest showed submassive PE over RML and RLL with evidence of right heart strain.    PT Comments    Pt performing gait training without device, with improved O2 saturations on RA.  Will inform supervising PT of change in recommendations from HHPT to cardiopulmonary rehab in an outpatient setting at d/c.  Pt successfully completed stair training with good technique.  Vitals WNL.    Follow Up Recommendations   (Pt moving well, will require x1 more visit for HEP, Pt would benefit from cardiopulmonary rehab in outpatient setting.  )     Equipment Recommendations  None recommended by PT    Recommendations for Other Services Other (comment)     Precautions / Restrictions Precautions Precaution Comments: Monitor O2 sats Restrictions Weight Bearing Restrictions: No    Mobility  Bed Mobility Overal bed mobility: Modified Independent Bed Mobility: Supine to Sit     Supine to sit: Modified independent (Device/Increase time)     General bed mobility comments: Pt performed with good technique without assistance.    Transfers Overall transfer level: Needs assistance Equipment used: None Transfers: Sit to/from Stand Sit to Stand: Supervision         General transfer comment: Pt performed with good technique to boost into sitting.    Ambulation/Gait Ambulation/Gait assistance: Min guard;Supervision Ambulation Distance (Feet): 350  Feet Assistive device: Rolling walker (2 wheeled) Gait Pattern/deviations: Step-through pattern;Staggering right   Gait velocity interpretation: at or above normal speed for age/gender General Gait Details: Pt able to maintain saturations at 93%-95% on RA during ambulation with PTA.  Pt required cues for reciprocal armswing and upper trunk control.     Stairs Stairs: Yes Stairs assistance: Supervision Stair Management: One rail Right;One rail Left;Forwards Number of Stairs: 7 General stair comments: Pt performed with reciprocal pattern and steady balance during technique.    Wheelchair Mobility    Modified Rankin (Stroke Patients Only)       Balance Overall balance assessment: Needs assistance   Sitting balance-Leahy Scale: Normal       Standing balance-Leahy Scale: Good                      Cognition Arousal/Alertness: Awake/alert Behavior During Therapy: WFL for tasks assessed/performed Overall Cognitive Status: Within Functional Limits for tasks assessed                      Exercises      General Comments        Pertinent Vitals/Pain Pain Assessment: No/denies pain Faces Pain Scale: Hurts little more Pain Descriptors / Indicators: Grimacing Pain Intervention(s): Monitored during session    Home Living                      Prior Function            PT Goals (current goals can now be found in the care plan section) Acute Rehab PT Goals Patient Stated Goal: to get better Potential  to Achieve Goals: Good Progress towards PT goals: Progressing toward goals    Frequency  Min 3X/week    PT Plan      Co-evaluation             End of Session Equipment Utilized During Treatment: Gait belt Activity Tolerance: Patient tolerated treatment well Patient left: in bed;with family/visitor present (sitting edge of bed)     Time: [id number] PT Time Calculation (min) (ACUTE ONLY): 13 min  Charges:  $Gait Training: 8-22  mins                    G Codes:      Cristela Blue Jul 03, 2015, 10:14 AM  Governor Rooks, PTA pager [phone]

## 2015-06-16 NOTE — Telephone Encounter (Signed)
Patients wife called in to advise that the patient was discharged from the hospital today. He has an RX for apixaban (ELIQUIS) 5 MG TABS tablet [id number] , but [facility] is needing a medical necessity clearance from [place] ASAP. Please contact them @ [phone] to initiate   Insurance Id # [id number]

## 2015-06-16 NOTE — Telephone Encounter (Signed)
Spoke to [name] to inform that per Dr Quay Burow, this should be approved by Cardiology due to [name] never seen Dr Quay Burow before. [name] has been notified.

## 2015-06-16 NOTE — Care Management Important Message (Signed)
Important Message  Patient Details  Name: [name] MRN: [id number] Date of Birth: 1945-10-15   Medicare Important Message Given:  Yes    [name] 06/16/2015, 10:06 AM

## 2015-06-16 NOTE — Discharge Summary (Addendum)
Physician Discharge Summary  [name] [id number] DOB: [birth year] DOA: 06/13/2015  PCP: [name], MD  Billey Gosling  Admit date: 06/13/2015 Discharge date: 06/16/2015  Time spent: 20 minutes  Recommendations for Outpatient Follow-up:  1. Follow up with PCP in 2-3 weeks 2. Recommend outpatient referral to Hematologist given recurrent PE   Discharge Diagnoses:  Principal Problem:   Recurrent pulmonary emboli (HCC) Active Problems:   Depression   HTN (hypertension)   BPH (benign prostatic hyperplasia)   Obstructive sleep apnea   Peripheral neuropathy (HCC)   Morbid obesity ([place])   H/O laparoscopic adjustable gastric banding & hiatal hernia repair 04/08/13   Pericardial effusion s/p pericardial window [id number]   Diastolic congestive heart failure (HCC)   PAF (paroxysmal atrial fibrillation) (HCC)   GERD (gastroesophageal reflux disease)   Asthma   Discharge Condition: Stable  Diet recommendation: Heart healthy  Filed Weights   06/14/15 0833  Weight: 151 kg (332 lb 14.3 oz)    History of present illness:  Please review dictated H and P from 3/12 for details. Briefly, 70 y.o. male with PMH of pulmonary embolism, hypertension, asthma, GERD, gout, depression, BPH, obesity, OSA on CPAP, PAF not on anticoagulants, diastolic congestive heart failure, skin cancer, pericardial effusion (S/P of pericardial window), upper GI bleeding due to gastric ulcer, who presents with right shoulder blade pain and shortness of breath.  Patient reports that he has been having pain over the right shoulder blade and shortness of breath in the past 4 days. He also has dry cough, but no CP, fever or chills. Patient does not have tenderness over calf areas. No recent long distance traveling. He has generalized weakness and headache. Patient had pulmonary embolism 2015, which was treated with Xarelto and was switched to Eliquis in the setting of GI bleeding.  CT angiogram of chest showed  submassive PE over RML and RLL with evidence of right heart strain. Patient is admitted to inpatient for further events and treatment.    Hospital Course:  Recurrent pulmonary emboli [facility]): CT angiogram of chest showed submassive PE over RML and RLL with evidence of right heart strain. Pt is hemodynamically stable currently, No CP. PCCM was consulted by EDP, and recommended to admit pt to SDU. This is recurrent PE, pt need lifelong anticoagulation. He was initially admitted to stepdown for close monitoring overnight. Started on IV heparin and transitioned him to oral eliquis. His ambulating oxygen levels remained over 90%. Echocardiogram showed mild focal basal hypertrophy, with normal LVEF and moderately dilated right and left atrium . Right ventricular systolic function is normal. LE dopplers negative for DVT.  Atrial Fibrillation: CHA2DS2-VASc Score is 4, needs oral anticoagulation. Patient was on Xarelto in the past which was d/c'ed due to upper GIB from gastric ulcer and he was switched to Eliquis. The repeated EGD on 07/31/13 showed that his gastric ulcer had resolved. Heart rate is well controlled. Pt is on flecainide. QTc=490 -Continued Flecainide -paroxysmal , started on IV heparin , later transitioned to eliquis  GERD: -Continued Protonix due to history of gastric ulcers and bleeding on anticoagulation   Depression and anxiety: Stable, no suicidal or homicidal ideations. -Continue home medications: Ativan, risperidone, Requip, Zoloft  Asthma: stable -prn Albuterol nebs - Mucinex for cough  HTN: Soft bp , holding lasix. Prn hydralazine.   BPH: stable - Continue Proscar  OSA probably related to morbid obesity: -CPAP  Chronic diastolic congestive heart failure: 2-D echo on 12/16/13 showed EF 65-70 percent. Patient is  on Lasix 20 mg when necessary. Has only trace leg edema and no JVD, CHF is compensated. -appears to be compensated.   History of GI bleeding Dr Henrene Pastor was  consulted from GI on 4/20 and he underwent endoscopy on 4/21 . He was found to have superficial ulcers in the stomach, underwent biopsy of the area. He was recommended to continue on PPI twice daily for 2 weeks and resume xarelto.repeat EGD which showed healing of ulcers, no bleed  Discharge Exam: Filed Vitals:   06/15/15 2236 06/16/15 0625 06/16/15 1004 06/16/15 1433  BP:  125/65  159/67  Pulse: 63 64 84 59  Temp:  98.6 F (37 C)  97.9 F (36.6 C)  TempSrc:  Oral  Oral  Resp: 19 18  20   Height:      Weight:      SpO2: 98% 95% 93% 96%    General: Awake, in nad Cardiovascular: regular, s1, s2 Respiratory: normal resp effort, no wheezing  Discharge Instructions     Medication List    TAKE these medications        apixaban 5 MG Tabs tablet  Commonly known as:  ELIQUIS  Take 2 tablets (10 mg total) by mouth 2 (two) times daily.     apixaban 5 MG Tabs tablet  Commonly known as:  ELIQUIS  Take 1 tablet (5 mg total) by mouth 2 (two) times daily. Start this after finishing your 10mg  doses  Start taking on:  06/22/2015     aspirin 81 MG tablet  Take 81 mg by mouth 2 (two) times daily.     CALCIUM PO  Take 500 mg by mouth 3 (three) times daily.     DSS 100 MG Caps  Take 100 mg by mouth every other day.     loratadine 10 MG tablet  Commonly known as:  CLARITIN  Take 10 mg by mouth daily.     EQ ALLERGY RELIEF 10 MG tablet  Generic drug:  loratadine  TAKE ONE TABLET BY MOUTH ONCE DAILY     finasteride 5 MG tablet  Commonly known as:  PROSCAR  Take 5 mg by mouth daily.     flecainide 50 MG tablet  Commonly known as:  TAMBOCOR  Take 1 tablet (50 mg total) by mouth every 12 (twelve) hours.     furosemide 20 MG tablet  Commonly known as:  LASIX  TAKE ONE TABLET BY MOUTH ONCE DAILY AS NEEDED FOR  SWELLING  IN  THE  LEGS     LORazepam 0.5 MG tablet  Commonly known as:  ATIVAN  Take 1 tab in the AM and 2 at bed time.     montelukast 10 MG tablet  Commonly known as:   SINGULAIR  TAKE ONE TABLET BY MOUTH AT BEDTIME     multivitamin tablet  Take 1 tablet by mouth daily. Chewable tablet with iron     pantoprazole 40 MG tablet  Commonly known as:  PROTONIX  TAKE ONE TABLET BY MOUTH TWICE DAILY     risperiDONE 0.5 MG tablet  Commonly known as:  RISPERDAL  Take 1 tablet (0.5 mg total) by mouth at bedtime.     rOPINIRole 0.25 MG tablet  Commonly known as:  REQUIP  3  Per night or as directed for leg jerks     sertraline 100 MG tablet  Commonly known as:  ZOLOFT  Take 1.5 tablets (150 mg total) by mouth daily with breakfast.     Vitamin B-12  1000 MCG Subl  Place 1,000 mcg under the tongue every evening.       Allergies  Allergen Reactions  . Morphine Other (See Comments)    Nausea and Severe hallucinations  . Codeine Nausea Only  . Penicillins Other (See Comments)    LOC with shot  . Propoxyphene N-Acetaminophen Nausea Only   Follow-up Information    Follow up with Binnie Rail, MD. Schedule an appointment as soon as possible for a visit in 2 weeks.   Specialty:  Internal Medicine   Why:  Hospital follow up   Contact information:   [place] [phone]        The results of significant diagnostics from this hospitalization (including imaging, microbiology, ancillary and laboratory) are listed below for reference.    Significant Diagnostic Studies: Dg Chest 2 View  06/13/2015  CLINICAL DATA:  Nonproductive cough. EXAM: CHEST  2 VIEW COMPARISON:  12/15/2013 chest radiograph. FINDINGS: Stable cardiomediastinal silhouette with normal heart size. No pneumothorax. Trace bilateral pleural effusions. No pulmonary edema. There is new patchy consolidation in the peripheral mid to lower right lung with associated pleural thickening. IMPRESSION: 1. New patchy consolidation in the peripheral mid to lower right lung, which could represent a right middle lobe pneumonia in the correct clinical setting. Recommend follow-up  PA and lateral post treatment chest radiographs in 4-6 weeks. 2. Trace bilateral pleural effusions. Electronically Signed   By: Ilona Sorrel M.D.   On: 06/13/2015 16:07   Ct Angio Chest Pe W/cm &/or Wo Cm  06/13/2015  CLINICAL DATA:  Cough weakness and upper back pain. EXAM: CT ANGIOGRAPHY CHEST WITH CONTRAST TECHNIQUE: Multidetector CT imaging of the chest was performed using the standard protocol during bolus administration of intravenous contrast. Multiplanar CT image reconstructions and MIPs were obtained to evaluate the vascular anatomy. CONTRAST:  80 cc Omnipaque 350 COMPARISON:  07/16/2013. FINDINGS: Mediastinum / Lymph Nodes: There is no axillary lymphadenopathy. No mediastinal lymphadenopathy. There is no hilar lymphadenopathy. The heart is at upper normal for size. No pericardial effusion. Coronary artery calcification is noted. The esophagus has normal imaging features.Acute pulmonary embolus is identified in lobar pulmonary arteries to the right middle and lower lobes. Segmental and subsegmental pulmonary embolus is noted in the lower lobes bilaterally. Lungs / Pleura: Wedge-shaped peripheral airspace disease in the right middle lobe is compatible with infarct. There is compressive atelectasis in the lower lobes bilaterally. Tiny right pleural effusion is evident. Upper Abdomen: Patient is status post laparoscopic banding procedure. Otherwise unremarkable. MSK / Soft Tissues: Bone windows reveal no worrisome lytic or sclerotic osseous lesions. Review of the MIP images confirms the above findings. IMPRESSION: 1. Acute lobar pulmonary embolus to the right middle and lower lobes with segmental and subsegmental pulmonary embolus to the lower lobes bilaterally. Positive for acute PE with CT evidence of right heart strain (RV/LV Ratio = 1.1) consistent with at least submassive (intermediate risk) PE. The presence of right heart strain has been associated with an increased risk of morbidity and mortality.  Please activate Code PE by paging [phone]. 2. Right middle lobe infarct. Critical Value/emergent results were called by me at the time of interpretation on 06/13/2015 at 6:49 pm to Dr. Blanchie Dessert , who verbally acknowledged these results. Electronically Signed   By: Misty Stanley M.D.   On: 06/13/2015 18:49    Microbiology: Recent Results (from the past 240 hour(s))  MRSA PCR Screening     Status: None  Collection Time: 06/14/15  8:26 AM  Result Value Ref Range Status   MRSA by PCR NEGATIVE NEGATIVE Final    Comment:        The GeneXpert MRSA Assay (FDA approved for NASAL specimens only), is one component of a comprehensive MRSA colonization surveillance program. It is not intended to diagnose MRSA infection nor to guide or monitor treatment for MRSA infections.      Labs: Basic Metabolic Panel:  Recent Labs Lab 06/13/15 1732 06/14/15 0345 06/15/15 0250 06/16/15 0250  NA 140 141 143 141  [name] 3.4* 3.5 4.1 3.9  CL 100* 105 104 104  CO2  --  26 27 28   GLUCOSE 103* 130* 95 96  BUN 22* 16 16 19   CREATININE 1.20 1.17 1.37* 1.39*  CALCIUM  --  7.9* 8.1* 8.0*  MG  --  2.1  --   --    Liver Function Tests:  Recent Labs Lab 06/14/15 0345 06/15/15 0250  AST 38 27  ALT 24 24  ALKPHOS 82 91  BILITOT 1.0 1.3*  PROT 6.0* 5.9*  ALBUMIN 2.9* 2.7*   No results for input(s): LIPASE, AMYLASE in the last 168 hours. No results for input(s): AMMONIA in the last 168 hours. CBC:  Recent Labs Lab 06/13/15 1700 06/13/15 1732 06/14/15 0345 06/15/15 0250 06/16/15 0250  WBC 11.2*  --  9.8 9.1 8.7  NEUTROABS 8.5*  --   --   --   --   HGB 13.4 15.6 12.6* 12.4* 11.9*  HCT 42.2 46.0 40.2 40.3 38.5*  MCV 87.4  --  87.4 89.8 89.3  PLT 170  --  180 193 197   Cardiac Enzymes: No results for input(s): CKTOTAL, CKMB, CKMBINDEX, TROPONINI in the last 168 hours. BNP: BNP (last 3 results)  Recent Labs  06/14/15 0345  BNP 88.3    ProBNP (last 3 results) No results for  input(s): PROBNP in the last 8760 hours.  CBG:  Recent Labs Lab 06/14/15 0832 06/15/15 0805 06/16/15 0636  GLUCAP 95 79 72       Signed:  Johnthan Axtman [name]  [facility] 06/16/2015, 5:55 PM

## 2015-06-17 ENCOUNTER — Telehealth: Payer: Self-pay | Admitting: *Deleted

## 2015-06-17 ENCOUNTER — Telehealth: Payer: Self-pay | Admitting: Cardiology

## 2015-06-17 NOTE — Telephone Encounter (Signed)
New message     FYI Pt was admitted to the hosp last Sunday.  He has a blood clot in his right lung.  The hosp started him on eliquis. Will Dr Debara Pickett follow him on his eliquis?  Forms are being faxed to Dr Debara Pickett to get it prior approved.  Please call pt when we complete the forms.  His [facility].  Pt has approx 11 pills left.

## 2015-06-17 NOTE — Telephone Encounter (Signed)
Spoke to [facility] at # [phone].Member ID # [id number], prior authorization started for Eliquis.Ref# [id number].Office will be notified in 72 hours if approved.Form will be faxed for Dr.[name]'s signature.  Spoke to patient he stated he is almost out of Eliquis.Eliquis 5 mg samples left at [employer] office front desk.   Message sent to Dr.[name]'s nurse Eliezer Lofts.

## 2015-06-17 NOTE — Telephone Encounter (Signed)
Transition Care Management Follow-up Telephone Call   Date discharged? 06/16/15   How have you been since you were released from the hospital? Pt states he is doing ok   Do you understand why you were in the hospital? YES   Do you understand the discharge instructions? YES   Where were you discharged to? Home   Items Reviewed:  Medications reviewed: YES  Allergies reviewed: YES  Dietary changes reviewed: YES, heart healthy  Referrals reviewed: YES, still waiting on hematologist appt to be set-up   Functional Questionnaire:   Activities of Daily Living (ADLs):   He states he are independent in the following: ambulation, bathing and hygiene, feeding, continence, grooming, toileting and dressing States he doesn't require assistance   Any transportation issues/concerns?: NO   Any patient concerns? NO   Confirmed importance and date/time of follow-up visits scheduled YES, pt had already made appt for 06/30/15 w/another MD due to no availability w/Dr. Quay Burow. Advise pt to keep appt  Provider Appointment booked with Dr. Cathlean Cower  Confirmed with patient if condition begins to worsen call PCP or go to the ER.  Patient was given the office number and encouraged to call back with question or concerns.  : YES

## 2015-06-17 NOTE — Telephone Encounter (Signed)
Wife called back and said when you receive the prior authorization forms,please be sure to mark where it says expedite. This way the insurance company will handle it in 24 hours.Thank you very much.

## 2015-06-17 NOTE — Telephone Encounter (Signed)
Form received - info filled out - on MD cart for signature.

## 2015-06-21 NOTE — Telephone Encounter (Signed)
Spoke with patient's wife. She states the insurance company called and they need a more detailed explanation as to why he needs to take Eliquis.   Upon further questioning, patient's eliquis is approved, but he needs a tier exception - costs $180/month  Patient cannot take xarelto - in 2015, had 2 pulmonary embolisms but had massive internal bleeding related to [place]   Routed to [employer] for assistance. Will have MD review form on 06/22/15 as well

## 2015-06-21 NOTE — Telephone Encounter (Signed)
Received notification via fax that a statement that the preferred drug for treatment of the enrollee's condition would not be as effective as the requested drug (eliquis 5mg  BID) and/or that the preferred drug would have adverse effects for the enrollee.

## 2015-06-21 NOTE — Telephone Encounter (Signed)
Please call pt's wife. She said that they received a call this morning from the insurance company that the form was still not completed.

## 2015-06-21 NOTE — Telephone Encounter (Signed)
eliquis 5mg  BID approved from 06/16/15 until further notice  Approved 60 per 30 days from [facility].

## 2015-06-22 ENCOUNTER — Telehealth: Payer: Self-pay | Admitting: Internal Medicine

## 2015-06-22 ENCOUNTER — Encounter: Payer: Self-pay | Admitting: Internal Medicine

## 2015-06-22 ENCOUNTER — Encounter: Payer: Self-pay | Admitting: *Deleted

## 2015-06-22 NOTE — Telephone Encounter (Signed)
Patient states that [facility] need an appeal for a tier exception. Patient reiterated again that he cannot take xarelto.   Patient was given the tier 1 price for Eliquis initially but when he went to get the prescription it was $170-180/month  Patient will contact [employer] to see what the formulary alternatives are and if he has a prescription drug deductible.   He will call back tomorrow

## 2015-06-22 NOTE — Telephone Encounter (Signed)
Pt's wife called back wanting to speak with [employer] [place] about the insurance company requesting additional information in order to get the Eliquis prescription approved. Please f/u with her  Thanks

## 2015-06-23 NOTE — Telephone Encounter (Signed)
Spoke with a Rep at [employer]. She will fax me a form for a tier exception. It is still going to require an MD signature, or at least a stamp.

## 2015-06-23 NOTE — Telephone Encounter (Signed)
Pt said he have been talking to [place] told him to call back and ask for Ovid Curd or Hilda Blades.

## 2015-06-23 NOTE — Telephone Encounter (Signed)
Pt is calling back to speak with [employer] [place].

## 2015-06-24 NOTE — Telephone Encounter (Signed)
Appeals letter completed and faxed (letter in epic)  Fax: [phone]

## 2015-06-24 NOTE — Telephone Encounter (Signed)
Spoke with patient - he provided formulary alternatives for Eliquis Warfarin  [place]  Coumadin Xarelto - patient cannot take - had internal hemorrhage  Pradaxa ($30/month - no quantity limit)  Member ID: [id number] Provider phone #: [phone]  Letter needs to state - [place] for tier exception  Appeal needs to be expedited   Patient states he can afford pradaxa if eliquis will not be moved to a lower tier.

## 2015-06-24 NOTE — Telephone Encounter (Signed)
Appeals letter completed.  Faxed to [phone]

## 2015-06-24 NOTE — Telephone Encounter (Signed)
Pt called again,he would like somebody to call him asap please.He called twice yesterday.

## 2015-06-25 ENCOUNTER — Other Ambulatory Visit: Payer: Self-pay | Admitting: Internal Medicine

## 2015-06-25 ENCOUNTER — Other Ambulatory Visit (HOSPITAL_COMMUNITY): Payer: Self-pay | Admitting: Psychiatry

## 2015-06-28 NOTE — Telephone Encounter (Signed)
Patient has appt with you  In couple of days---are u his new pcp----looks like hospital fu appt was made at the hospital for you----if you are new pcp, are you ok with refilling this rx for singulair?--please advise, thanks

## 2015-06-30 ENCOUNTER — Encounter: Payer: Self-pay | Admitting: Internal Medicine

## 2015-06-30 ENCOUNTER — Ambulatory Visit (INDEPENDENT_AMBULATORY_CARE_PROVIDER_SITE_OTHER): Payer: Medicare Other | Admitting: Internal Medicine

## 2015-06-30 VITALS — BP 130/80 | HR 69 | Temp 98.8°F | Resp 20 | Wt 330.0 lb

## 2015-06-30 DIAGNOSIS — Z7901 Long term (current) use of anticoagulants: Secondary | ICD-10-CM

## 2015-06-30 DIAGNOSIS — I2699 Other pulmonary embolism without acute cor pulmonale: Secondary | ICD-10-CM

## 2015-06-30 DIAGNOSIS — I10 Essential (primary) hypertension: Secondary | ICD-10-CM

## 2015-06-30 DIAGNOSIS — R001 Bradycardia, unspecified: Secondary | ICD-10-CM | POA: Diagnosis not present

## 2015-06-30 DIAGNOSIS — I5032 Chronic diastolic (congestive) heart failure: Secondary | ICD-10-CM

## 2015-06-30 NOTE — Progress Notes (Signed)
Subjective:    Patient ID: [name], male    DOB: 04/12/1945, 70 y.o.   MRN: [id number]  HPI  Here to [facility] post hospn mar 12-15, with submassive PE, second episode after last in 2015 assoc with GI bleeding/stomach ulcer, influenza with pericarditis s/p pericaredial window. Started xarelto but due to bleeding, was changed to eliquix x 6 mo, stopped and did well unmtil Jun 13 2015, now back on eliquis, Does c/o ongoing fatigue, but denies signficant daytime hypersomnolence., just weaker than usual post PE. Pt denies chest pain, increased sob or doe, wheezing, orthopnea, PND, increased LE swelling, palpitations, dizziness or syncope.  Pt denies new neurological symptoms such as new headache, or facial or extremity weakness or numbness   Pt denies polydipsia, polyuria.   Pt not wanting hematology as would not likely change his tx. No other hosp complications  No fever, ST, cough/  Sees urology with psa f/u. Noted has HR's occas in the 83's Past Medical History  Diagnosis Date  . Benign prostatic hypertrophy     several prostate biopsies neg for cancer.   . Hypertension   . Gout   . Depression   . FH: colonic polyps 2010  . Morbid obesity ([place])   . Complication of anesthesia     MORPHINE CAUSES HALLUCINATIONS  . Asthma     childhood asthma  . Numbness     feet / legs  . History of kidney stones   . Kidney cysts   . Sleep apnea     USES C PAP  . Bilateral hip pain   . PAF (paroxysmal atrial fibrillation) with RVR 07/29/2013  . Anemia 07/29/2013  . CHF (congestive heart failure) ([place])   . Cancer [facility])    Past Surgical History  Procedure Laterality Date  . Total knee replacment      bilat  . Cholecystectomy  1989  . Trigger finger repair      also lue, cts surgically repaired  . Renal calculi    . Tonsillectomy    . Lithotripsy    . Arthroscopy knee w/ drilling    . Breath tek h pylori N/A 12/23/2012    Procedure: BREATH TEK H PYLORI;  Surgeon: Gayland Curry, MD;  Location: [facility]  END[facility];  Service: General;  Laterality: N/A;  . Wisdom tooth extraction    . Laparoscopic gastric banding with hiatal hernia repair N/A 04/08/2013    Procedure: LAPAROSCOPIC GASTRIC BANDING WITH possible  HIATAL HERNIA REPAIR;  Surgeon: Gayland Curry, MD;  Location: [facility] ORS;  Service: General;  Laterality: N/A;  . Subxyphoid pericardial window N/A 07/18/2013    Procedure: SUBXYPHOID PERICARDIAL WINDOW;  Surgeon: Grace Isaac, MD;  Location: [facility];  Service: Thoracic;  Laterality: N/A;  . Intraoperative transesophageal echocardiogram N/A 07/18/2013    Procedure: INTRAOPERATIVE TRANSESOPHAGEAL ECHOCARDIOGRAM;  Surgeon: Grace Isaac, MD;  Location: [facility];  Service: Open Heart Surgery;  Laterality: N/A;  . Esophagogastroduodenoscopy N/A 07/22/2013    Procedure: ESOPHAGOGASTRODUODENOSCOPY (EGD);  Surgeon: Irene Shipper, MD;  Location: [facility] ENDOSCOPY;  Service: Endoscopy;  Laterality: N/A;  . Colonoscopy N/A 07/31/2013    Procedure: COLONOSCOPY;  Surgeon: Gatha Mayer, MD;  Location: [facility];  Service: Endoscopy;  Laterality: N/A;  . Esophagogastroduodenoscopy N/A 07/31/2013    Procedure: ESOPHAGOGASTRODUODENOSCOPY (EGD);  Surgeon: Gatha Mayer, MD;  Location: [facility] ENDOSCOPY;  Service: Endoscopy;  Laterality: N/A;    reports that he quit smoking about 40 years ago. His smoking  use included Cigarettes. He started smoking about 54 years ago. He has a 2.6 pack-year smoking history. He does not have any smokeless tobacco history on file. He reports that he drinks alcohol. He reports that he does not use illicit drugs. family history includes Depression in his brother and mother; Diabetes in his cousin and paternal uncle; Heart disease in his father, paternal grandmother, and paternal uncle; Hypertension in his mother; Stroke in his paternal aunt. Allergies  Allergen Reactions  . Morphine Other (See Comments)    Nausea and Severe hallucinations  . Codeine Nausea Only  . Penicillins Other (See  Comments)    LOC with shot  . Propoxyphene N-Acetaminophen Nausea Only   Current Outpatient Prescriptions on File Prior to Visit  Medication Sig Dispense Refill  . apixaban (ELIQUIS) 5 MG TABS tablet Take 2 tablets (10 mg total) by mouth 2 (two) times daily. 11 tablet 0  . aspirin 81 MG tablet Take 81 mg by mouth 2 (two) times daily.     [name] CALCIUM PO Take 500 mg by mouth 3 (three) times daily.     . Cyanocobalamin (VITAMIN B-12) 1000 MCG SUBL Place 1,000 mcg under the tongue every evening.     [name] Sodium (DSS) 100 MG CAPS Take 100 mg by mouth every other day.    Noelle Penner ALLERGY RELIEF 10 MG tablet TAKE ONE TABLET BY MOUTH ONCE DAILY 30 tablet 11  . finasteride (PROSCAR) 5 MG tablet Take 5 mg by mouth daily.     . flecainide (TAMBOCOR) 50 MG tablet TAKE ONE TABLET BY MOUTH EVERY 12 HOURS 180 tablet 0  . furosemide (LASIX) 20 MG tablet TAKE ONE TABLET BY MOUTH ONCE DAILY AS NEEDED FOR  SWELLING  IN  THE  LEGS (Patient taking differently: TAKE ONE TABLET BY MOUTH ONCE DAILY) 90 tablet 1  . loratadine (CLARITIN) 10 MG tablet Take 10 mg by mouth daily.    [name] LORazepam (ATIVAN) 0.5 MG tablet Take 1 tab in the AM and 2 at bed time. 90 tablet 2  . montelukast (SINGULAIR) 10 MG tablet TAKE ONE TABLET BY MOUTH AT BEDTIME 30 tablet 11  . Multiple Vitamin (MULTIVITAMIN) tablet Take 1 tablet by mouth daily. Chewable tablet with iron    . pantoprazole (PROTONIX) 40 MG tablet TAKE ONE TABLET BY MOUTH TWICE DAILY 180 tablet 3  . risperiDONE (RISPERDAL) 0.5 MG tablet Take 1 tablet (0.5 mg total) by mouth at bedtime. 30 tablet 2  . rOPINIRole (REQUIP) 0.25 MG tablet 3  Per night or as directed for leg jerks (Patient taking differently: Take 0.75 mg by mouth at bedtime. 3  Per night or as directed for leg jerks) 90 tablet 11  . sertraline (ZOLOFT) 100 MG tablet Take 1.5 tablets (150 mg total) by mouth daily with breakfast. 45 tablet 2   No current facility-administered medications on file prior to visit.    Review of Systems  Constitutional: Negative for unusual diaphoresis or night sweats HENT: Negative for ear swelling or discharge Eyes: Negative for worsening visual haziness  Respiratory: Negative for choking and stridor.   Gastrointestinal: Negative for distension or worsening eructation Genitourinary: Negative for retention or change in urine volume.  Musculoskeletal: Negative for other MSK pain or swelling Skin: Negative for color change and worsening wound Neurological: Negative for tremors and numbness other than noted  Psychiatric/Behavioral: Negative for decreased concentration or agitation other than above       Objective:   Physical Exam BP 130/80  mmHg  Pulse 69  Temp(Src) 98.8 F (37.1 C) (Oral)  Resp 20  Wt 330 lb (149.687 kg)  SpO2 95% VS noted, not ill appearing Constitutional: Pt appears in no apparent distress HENT: Head: NCAT.  Right Ear: External ear normal.  Left Ear: External ear normal.  Eyes: . Pupils are equal, round, and reactive to light. Conjunctivae and EOM are normal Neck: Normal range of motion. Neck supple.  Cardiovascular: Normal rate and regular rhythm.   Pulmonary/Chest: Effort normal and breath sounds without rales or wheezing.  Neurological: Pt is alert. Not confused , motor grossly intact Skin: Skin is warm. No rash, trace chronic bialt ankle edema Psychiatric: Pt behavior is normal. No agitation.     Assessment & Plan:

## 2015-06-30 NOTE — Patient Instructions (Signed)
Please continue all other medications as before, and refills have been done if requested.  Please have the pharmacy call with any other refills you may need.  Please continue your efforts at being more active, low cholesterol diet, and weight control.  You are otherwise up to date with prevention measures today.  Please keep your appointments with your specialists as you may have planned  Please call if you change your mind about seeing Hematology  Please return in 3 months, or sooner if needed

## 2015-06-30 NOTE — Assessment & Plan Note (Signed)
Will need to avoid neg chronotropes in future, o/w stable

## 2015-06-30 NOTE — Assessment & Plan Note (Signed)
stable overall by history and exam, recent data reviewed with pt, and pt to continue medical treatment as before,  to f/u any worsening symptoms or concern Lab Results  Component Value Date   WBC 8.7 06/16/2015   HGB 11.9* 06/16/2015   HCT 38.5* 06/16/2015   PLT 197 06/16/2015   GLUCOSE 96 06/16/2015   CHOL 138 12/05/2012   TRIG 137 12/05/2012   HDL 34* 12/05/2012   LDLDIRECT 111.6 02/15/2012   LDLCALC 77 12/05/2012   ALT 24 06/15/2015   AST 27 06/15/2015   NA 141 06/16/2015   K 3.9 06/16/2015   CL 104 06/16/2015   CREATININE 1.39* 06/16/2015   BUN 19 06/16/2015   CO2 28 06/16/2015   TSH 3.700 12/15/2013   PSA 0.76 11/03/2009   INR 1.27 06/14/2015   HGBA1C 6.1* 12/05/2012   MICROALBUR 1.1 12/12/2006

## 2015-06-30 NOTE — Assessment & Plan Note (Signed)
Stable, for cont;d eliquis indefinite, declines heme referral

## 2015-06-30 NOTE — Assessment & Plan Note (Signed)
stable overall by history and exam, recent data reviewed with pt, and pt to continue medical treatment as before,  to f/u any worsening symptoms or concerns BP Readings from Last 3 Encounters:  06/30/15 130/80  06/16/15 159/67  05/10/15 138/82

## 2015-06-30 NOTE — Assessment & Plan Note (Signed)
stable overall by history and exam, and pt to continue medical treatment as before,  to f/u any worsening symptoms or concerns 

## 2015-06-30 NOTE — Telephone Encounter (Signed)
eliquis 5mg  BID #60 per 30 days approved from 06/22/2015 - 04/02/2016  Reduced to tier 3

## 2015-06-30 NOTE — Progress Notes (Signed)
Pre visit review using our clinic review tool, if applicable. No additional management support is needed unless otherwise documented below in the visit note. 

## 2015-07-08 ENCOUNTER — Ambulatory Visit (INDEPENDENT_AMBULATORY_CARE_PROVIDER_SITE_OTHER): Payer: Medicare Other | Admitting: Psychiatry

## 2015-07-08 ENCOUNTER — Encounter (HOSPITAL_COMMUNITY): Payer: Self-pay | Admitting: Psychiatry

## 2015-07-08 VITALS — BP 128/74 | HR 59 | Ht 72.0 in | Wt 334.0 lb

## 2015-07-08 DIAGNOSIS — F330 Major depressive disorder, recurrent, mild: Secondary | ICD-10-CM

## 2015-07-08 MED ORDER — RISPERIDONE 0.5 MG PO TABS: 0.5000 mg | ORAL_TABLET | Freq: Every day | ORAL | Status: DC

## 2015-07-08 MED ORDER — LORAZEPAM 0.5 MG PO TABS: ORAL_TABLET | ORAL | Status: DC

## 2015-07-08 MED ORDER — SERTRALINE HCL 100 MG PO TABS: 150.0000 mg | ORAL_TABLET | Freq: Every day | ORAL | Status: DC

## 2015-07-08 NOTE — Progress Notes (Signed)
[facility] Progress Note  [name] [id number] 70 y.o.  07/08/2015 2:51 PM  Chief Complaint:  Medication management and follow-up.             History of Present Illness:  [name] came for his appointment.  He was recently admitted to medical floor due to blood clot.  He is feeling better.  He endorses depression is a stable but he get some time tired due to his physical limits.  He sleeping good.  He denies any irritability, anger, mood swing or any crying spells.  Lately he is trying to sell his mother's house but he was upset because appraisal came very low.  He feels his medicines are working.  He wants to continue Risperdal, Zoloft and lorazepam.  He denies any feeling of hopelessness or worthlessness.  Patient denies any side effects of medication.  His energy level is okay.  His appetite is okay.  His vitals are stable.  He lives with his wife who is very supportive.  Patient denies drinking or using any illegal substances.   Suicidal Ideation: No Plan Formed: No Patient has means to carry out plan: No  Homicidal Ideation: No Plan Formed: No Patient has means to carry out plan: No  Review of Systems: Psychiatric: Agitation: No Hallucination: No Depressed Mood: No Insomnia: No Hypersomnia: No Altered Concentration: No Feels Worthless: No Grandiose Ideas: No Belief In Special Powers: No New/Increased Substance Abuse: No Compulsions: No  Neurologic: Headache: No Seizure: No Paresthesias: No  Past Medical History  Diagnosis Date  . Benign prostatic hypertrophy     several prostate biopsies neg for cancer.   . Hypertension   . Gout   . Depression   . FH: colonic polyps 2010  . Morbid obesity ([place])   . Complication of anesthesia     MORPHINE CAUSES HALLUCINATIONS  . Asthma     childhood asthma  . Numbness     feet / legs  . History of kidney stones   . Kidney cysts   . Sleep apnea     USES C PAP  . Bilateral hip pain   . PAF  (paroxysmal atrial fibrillation) with RVR 07/29/2013  . Anemia 07/29/2013  . CHF (congestive heart failure) ([place])   . Cancer [facility])     Outpatient Encounter Prescriptions as of 07/08/2015  Medication Sig  . apixaban (ELIQUIS) 5 MG TABS tablet Take 2 tablets (10 mg total) by mouth 2 (two) times daily.  [name] aspirin 81 MG tablet Take 81 mg by mouth 2 (two) times daily.   [name] CALCIUM PO Take 500 mg by mouth 3 (three) times daily.   . Cyanocobalamin (VITAMIN B-12) 1000 MCG SUBL Place 1,000 mcg under the tongue every evening.   [name] Sodium (DSS) 100 MG CAPS Take 100 mg by mouth every other day.  Noelle Penner ALLERGY RELIEF 10 MG tablet TAKE ONE TABLET BY MOUTH ONCE DAILY  . finasteride (PROSCAR) 5 MG tablet Take 5 mg by mouth daily.   . flecainide (TAMBOCOR) 50 MG tablet TAKE ONE TABLET BY MOUTH EVERY 12 HOURS  . furosemide (LASIX) 20 MG tablet TAKE ONE TABLET BY MOUTH ONCE DAILY AS NEEDED FOR  SWELLING  IN  THE  LEGS (Patient taking differently: TAKE ONE TABLET BY MOUTH ONCE DAILY)  . loratadine (CLARITIN) 10 MG tablet Take 10 mg by mouth daily.  [name] LORazepam (ATIVAN) 0.5 MG tablet Take 1 tab in the AM and 2 at bed time.  [name]  montelukast (SINGULAIR) 10 MG tablet TAKE ONE TABLET BY MOUTH AT BEDTIME  . Multiple Vitamin (MULTIVITAMIN) tablet Take 1 tablet by mouth daily. Chewable tablet with iron  . pantoprazole (PROTONIX) 40 MG tablet TAKE ONE TABLET BY MOUTH TWICE DAILY  . risperiDONE (RISPERDAL) 0.5 MG tablet Take 1 tablet (0.5 mg total) by mouth at bedtime.  [name] rOPINIRole (REQUIP) 0.25 MG tablet 3  Per night or as directed for leg jerks (Patient taking differently: Take 0.75 mg by mouth at bedtime. 3  Per night or as directed for leg jerks)  . sertraline (ZOLOFT) 100 MG tablet Take 1.5 tablets (150 mg total) by mouth daily with breakfast.  . [DISCONTINUED] LORazepam (ATIVAN) 0.5 MG tablet Take 1 tab in the AM and 2 at bed time.  . [DISCONTINUED] risperiDONE (RISPERDAL) 0.5 MG tablet Take 1 tablet (0.5 mg  total) by mouth at bedtime.  . [DISCONTINUED] sertraline (ZOLOFT) 100 MG tablet Take 1.5 tablets (150 mg total) by mouth daily with breakfast.   No facility-administered encounter medications on file as of 07/08/2015.    Past Psychiatric History/Hospitalization(s) Patient has one psychiatric admission in 2006 due to increased depression and having suicidal thoughts.  Since then patient has been seen in this office and stable on his psychiatric medication. Anxiety: Yes Bipolar Disorder: No Depression: Yes Mania: No Psychosis: No Schizophrenia: No Personality Disorder: No Hospitalization for psychiatric illness: Yes History of Electroconvulsive Shock Therapy: No Prior Suicide Attempts: No  Physical Exam: Constitutional:  BP 128/74 mmHg  Pulse 59  Ht 6' (1.829 m)  Wt 334 lb (151.501 kg)  BMI 45.29 kg/m2  Musculoskeletal: Strength & Muscle Tone: within normal limits Gait & Station: normal Patient leans: N/A  Mental Status Examination;  Patient is casually dressed and fairly groomed.  He is pleasant, cooperative and maintained good eye contact.  His speech is soft, clear and coherent.  He described his mood Tired and his affect is improved from the past.  His thought process is logical linear and goal directed.  There were no flight if ideas of any loose association.  He denies any active or passive suicidal thoughts and homicidal thoughts.  His fund of knowledge is adequate.  There were no paranoia or delusions present at this time.  His attention concentration is fair.  He is alert and oriented x3.  His insight judgment and impulse control is okay.   Established Problem, Stable/Improving (1), Review of Psycho-Social Stressors (1), Review of Last Therapy Session (1) and Review of Medication Regimen & Side Effects (2)  Assessment: Axis I: Maj. depressive disorder, recurrent  Axis II: Deferred  Axis III:  Past Medical History  Diagnosis Date  . Benign prostatic hypertrophy      several prostate biopsies neg for cancer.   . Hypertension   . Gout   . Depression   . FH: colonic polyps 2010  . Morbid obesity ([place])   . Complication of anesthesia     MORPHINE CAUSES HALLUCINATIONS  . Asthma     childhood asthma  . Numbness     feet / legs  . History of kidney stones   . Kidney cysts   . Sleep apnea     USES C PAP  . Bilateral hip pain   . PAF (paroxysmal atrial fibrillation) with RVR 07/29/2013  . Anemia 07/29/2013  . CHF (congestive heart failure) ([place])   . Cancer [facility])    Plan:  Patient is a stable on his current psychiatric medication.  I will  continue Zoloft 150 mg daily, lorazepam 0.5 mg in the morning and 1 mg at bedtime and Risperdal 0.5 mg at bedtime.  He has no side effects including any tremors shakes or EPS.  Recommended to call us back if he is any question or any concern.  Follow-up in 3 months.  Saraia Platner T., MD 07/08/2015

## 2015-07-26 ENCOUNTER — Other Ambulatory Visit: Payer: Self-pay | Admitting: Pharmacist Clinician (PhC)/ Clinical Pharmacy Specialist

## 2015-07-26 MED ORDER — APIXABAN 5 MG PO TABS: 10.0000 mg | ORAL_TABLET | Freq: Two times a day (BID) | ORAL | Status: DC

## 2015-08-12 ENCOUNTER — Other Ambulatory Visit: Payer: Self-pay | Admitting: Internal Medicine

## 2015-08-20 ENCOUNTER — Other Ambulatory Visit: Payer: Self-pay | Admitting: Internal Medicine

## 2015-09-10 ENCOUNTER — Other Ambulatory Visit: Payer: Self-pay | Admitting: Internal Medicine

## 2015-09-14 NOTE — Telephone Encounter (Signed)
CY Please advise on refill. Thanks.  

## 2015-09-15 NOTE — Telephone Encounter (Signed)
Ok to refill x 12 months 

## 2015-09-21 ENCOUNTER — Telehealth: Payer: Self-pay | Admitting: Internal Medicine

## 2015-09-21 NOTE — Telephone Encounter (Signed)
Patient states that Dr. [name] wanted him to be referred to Dr. Quay Burow.  Patient states his wife got transferred to [place] he would like to transfer from [place] to [place].  Please advise.

## 2015-09-22 NOTE — Telephone Encounter (Signed)
At this point I have too many new patient appointments so I can not accept him, but possibly in the future.

## 2015-09-24 ENCOUNTER — Other Ambulatory Visit (HOSPITAL_COMMUNITY): Payer: Self-pay | Admitting: Psychiatry

## 2015-09-24 NOTE — Telephone Encounter (Signed)
Patient is requesting to transfer to [place].  Please advise.

## 2015-09-24 NOTE — Telephone Encounter (Signed)
Notified patient.

## 2015-09-26 NOTE — Telephone Encounter (Signed)
Yes, I will see him.

## 2015-09-27 NOTE — Telephone Encounter (Signed)
Left patient vm to call  Back to schedule

## 2015-09-29 ENCOUNTER — Ambulatory Visit: Payer: Medicare Other | Admitting: Internal Medicine

## 2015-10-01 ENCOUNTER — Other Ambulatory Visit: Payer: Self-pay | Admitting: Internal Medicine

## 2015-10-19 ENCOUNTER — Other Ambulatory Visit (HOSPITAL_COMMUNITY): Payer: Self-pay | Admitting: Psychiatry

## 2015-10-19 ENCOUNTER — Ambulatory Visit (HOSPITAL_COMMUNITY): Payer: Self-pay | Admitting: Psychiatry

## 2015-10-20 ENCOUNTER — Encounter: Payer: Self-pay | Admitting: Internal Medicine

## 2015-10-20 ENCOUNTER — Ambulatory Visit (INDEPENDENT_AMBULATORY_CARE_PROVIDER_SITE_OTHER): Payer: Medicare Other | Admitting: Internal Medicine

## 2015-10-20 ENCOUNTER — Other Ambulatory Visit (INDEPENDENT_AMBULATORY_CARE_PROVIDER_SITE_OTHER): Payer: Medicare Other

## 2015-10-20 VITALS — BP 116/62 | HR 78 | Temp 97.9°F | Resp 16 | Ht 72.0 in | Wt 341.5 lb

## 2015-10-20 DIAGNOSIS — N400 Enlarged prostate without lower urinary tract symptoms: Secondary | ICD-10-CM | POA: Diagnosis not present

## 2015-10-20 DIAGNOSIS — I480 Paroxysmal atrial fibrillation: Secondary | ICD-10-CM | POA: Diagnosis not present

## 2015-10-20 DIAGNOSIS — Z9884 Bariatric surgery status: Secondary | ICD-10-CM

## 2015-10-20 DIAGNOSIS — G63 Polyneuropathy in diseases classified elsewhere: Secondary | ICD-10-CM

## 2015-10-20 DIAGNOSIS — Z23 Encounter for immunization: Secondary | ICD-10-CM | POA: Diagnosis not present

## 2015-10-20 DIAGNOSIS — D539 Nutritional anemia, unspecified: Secondary | ICD-10-CM

## 2015-10-20 DIAGNOSIS — R7303 Prediabetes: Secondary | ICD-10-CM | POA: Diagnosis not present

## 2015-10-20 DIAGNOSIS — E785 Hyperlipidemia, unspecified: Secondary | ICD-10-CM

## 2015-10-20 LAB — CBC WITH DIFFERENTIAL/PLATELET
BASOS PCT: 0.4 % (ref 0.0–3.0)
Basophils Absolute: 0 10*3/uL (ref 0.0–0.1)
Eosinophils Absolute: 0.1 10*3/uL (ref 0.0–0.7)
Eosinophils Relative: 0.8 % (ref 0.0–5.0)
HEMATOCRIT: 44.2 % (ref 39.0–52.0)
Hemoglobin: 14.8 g/dL (ref 13.0–17.0)
LYMPHS ABS: 1.4 10*3/uL (ref 0.7–4.0)
LYMPHS PCT: 12.8 % (ref 12.0–46.0)
MCHC: 33.5 g/dL (ref 30.0–36.0)
MCV: 83.9 fl (ref 78.0–100.0)
MONOS PCT: 7.6 % (ref 3.0–12.0)
Monocytes Absolute: 0.8 10*3/uL (ref 0.1–1.0)
NEUTROS ABS: 8.8 10*3/uL — AB (ref 1.4–7.7)
NEUTROS PCT: 78.4 % — AB (ref 43.0–77.0)
PLATELETS: 234 10*3/uL (ref 150.0–400.0)
RBC: 5.27 Mil/uL (ref 4.22–5.81)
RDW: 14.9 % (ref 11.5–15.5)
WBC: 11.2 10*3/uL — ABNORMAL HIGH (ref 4.0–10.5)

## 2015-10-20 LAB — LIPID PANEL
Cholesterol: 235 mg/dL — ABNORMAL HIGH (ref 0–200)
HDL: 35.2 mg/dL — AB (ref >39.00)
NONHDL: 199.36
Total CHOL/HDL Ratio: 7
Triglycerides: 273 mg/dL — ABNORMAL HIGH (ref 0.0–149.0)
VLDL: 54.6 mg/dL — ABNORMAL HIGH (ref 0.0–40.0)

## 2015-10-20 LAB — COMPREHENSIVE METABOLIC PANEL
ALBUMIN: 3.9 g/dL (ref 3.5–5.2)
ALT: 15 U/L (ref 0–53)
AST: 17 U/L (ref 0–37)
Alkaline Phosphatase: 67 U/L (ref 39–117)
BILIRUBIN TOTAL: 0.7 mg/dL (ref 0.2–1.2)
BUN: 26 mg/dL — AB (ref 6–23)
CO2: 31 mEq/L (ref 19–32)
CREATININE: 1.29 mg/dL (ref 0.40–1.50)
Calcium: 9.4 mg/dL (ref 8.4–10.5)
Chloride: 103 mEq/L (ref 96–112)
GFR: 58.55 mL/min — ABNORMAL LOW (ref >60.00)
GLUCOSE: 92 mg/dL (ref 70–99)
Potassium: 4 mEq/L (ref 3.5–5.1)
SODIUM: 144 meq/L (ref 135–145)
TOTAL PROTEIN: 6.8 g/dL (ref 6.0–8.3)

## 2015-10-20 LAB — IBC PANEL
Iron: 87 ug/dL (ref 42–165)
Saturation Ratios: 18.6 % — ABNORMAL LOW (ref 20.0–50.0)
Transferrin: 334 mg/dL (ref 212.0–360.0)

## 2015-10-20 LAB — LDL CHOLESTEROL, DIRECT: LDL DIRECT: 168 mg/dL

## 2015-10-20 LAB — PSA: PSA: 0.95 ng/mL (ref 0.10–4.00)

## 2015-10-20 LAB — HEMOGLOBIN A1C: HEMOGLOBIN A1C: 5.6 % (ref 4.6–6.5)

## 2015-10-20 LAB — VITAMIN B12

## 2015-10-20 LAB — FOLATE

## 2015-10-20 LAB — FERRITIN: Ferritin: 21.1 ng/mL — ABNORMAL LOW (ref 22.0–322.0)

## 2015-10-20 MED ORDER — ATORVASTATIN CALCIUM 40 MG PO TABS: 40.0000 mg | ORAL_TABLET | Freq: Every day | ORAL | Status: DC

## 2015-10-20 NOTE — Progress Notes (Signed)
Pre visit review using our clinic review tool, if applicable. No additional management support is needed unless otherwise documented below in the visit note. 

## 2015-10-20 NOTE — Progress Notes (Signed)
Subjective:  Patient ID: [name], male    DOB: May 31, 1945  Age: 70 y.o. MRN: [id number]  CC: Anemia and Hyperlipidemia   HPI [name] presents for est a new PCP. He has a complex medical history that includes sleep apnea, prior pulmonary embolus, atrial fibrillation, B12 deficiency with peripheral neuropathy, morbid obesity, prior gastric banding, and chronic anemia. He offers no new or different complaints today. He has a stable amount of edema in his lower extremities and denies chest pain, palpitations, shortness of breath, syncope, or near-syncope. He has chronic numbness in both legs and feet that is unchanged recently.  History [name] has a past medical history of Benign prostatic hypertrophy; Hypertension; Gout; Depression; FH: colonic polyps (2010); Morbid obesity ([place]); Complication of anesthesia; Asthma; Numbness; History of kidney stones; Kidney cysts; Sleep apnea; Bilateral hip pain; PAF (paroxysmal atrial fibrillation) with RVR (07/29/2013); Anemia (07/29/2013); CHF (congestive heart failure) ([place]); and Cancer ([place]).   He has past surgical history that includes total knee replacment; Cholecystectomy (1989); trigger finger repair; renal calculi; Tonsillectomy; Lithotripsy; Arthroscopy knee w/ drilling; Breath tek h pylori (N/A, 12/23/2012); Wisdom tooth extraction; Laparoscopic gastric banding with hiatal hernia repair (N/A, 04/08/2013); Subxyphoid pericardial window (N/A, 07/18/2013); Intraoprative transesophageal echocardiogram (N/A, 07/18/2013); Esophagogastroduodenoscopy (N/A, 07/22/2013); Colonoscopy (N/A, 07/31/2013); and Esophagogastroduodenoscopy (N/A, 07/31/2013).   His family history includes Depression in his brother and mother; Diabetes in his cousin and paternal uncle; Heart disease in his father, paternal grandmother, and paternal uncle; Hypertension in his mother; Stroke in his paternal aunt.He reports that he quit smoking about 41 years ago. His smoking use  included Cigarettes. He started smoking about 54 years ago. He has a 2.6 pack-year smoking history. He does not have any smokeless tobacco history on file. He reports that he drinks alcohol. He reports that he does not use illicit drugs.  Outpatient Prescriptions Prior to Visit  Medication Sig Dispense Refill  . aspirin 81 MG tablet Take 81 mg by mouth 2 (two) times daily.     [name] Kitchen CALCIUM PO Take 500 mg by mouth 3 (three) times daily.     . Cyanocobalamin (VITAMIN B-12) 1000 MCG SUBL Place 1,000 mcg under the tongue every evening.     [name] Sodium (DSS) 100 MG CAPS Take 100 mg by mouth every other day.    [name] ALLERGY RELIEF 10 MG tablet TAKE ONE TABLET BY MOUTH ONCE DAILY 30 tablet 3  . finasteride (PROSCAR) 5 MG tablet Take 5 mg by mouth daily.     . flecainide (TAMBOCOR) 50 MG tablet TAKE ONE TABLET BY MOUTH EVERY 12 HOURS 180 tablet 2  . furosemide (LASIX) 20 MG tablet TAKE ONE TABLET BY MOUTH ONCE DAILY AS NEEDED FOR  SWELLING  IN  THE  LEGS 90 tablet 0  . loratadine (CLARITIN) 10 MG tablet Take 10 mg by mouth daily.    [name] Kitchen LORazepam (ATIVAN) 0.5 MG tablet Take 1 tab in the AM and 2 at bed time. 90 tablet 2  . montelukast (SINGULAIR) 10 MG tablet TAKE ONE TABLET BY MOUTH AT BEDTIME 30 tablet 11  . Multiple Vitamin (MULTIVITAMIN) tablet Take 1 tablet by mouth daily. Chewable tablet with iron    . pantoprazole (PROTONIX) 40 MG tablet TAKE ONE TABLET BY MOUTH TWICE DAILY 180 tablet 2  . risperiDONE (RISPERDAL) 0.5 MG tablet TAKE ONE TABLET BY MOUTH AT BEDTIME 30 tablet 0  . rOPINIRole (REQUIP) 0.25 MG tablet TAKE THREE TABLETS BY MOUTH AT BEDTIME OR  AS  DIRECTED  FOR  LEG  JERKS 90 tablet 11  . apixaban (ELIQUIS) 5 MG TABS tablet Take 2 tablets (10 mg total) by mouth 2 (two) times daily. 60 tablet 5  . sertraline (ZOLOFT) 100 MG tablet Take 1.5 tablets (150 mg total) by mouth daily with breakfast. 45 tablet 2   No facility-administered medications prior to visit.    ROS Review of  Systems  Constitutional: Negative.  Negative for fever, chills, diaphoresis, appetite change and fatigue.  HENT: Negative.  Negative for trouble swallowing and voice change.   Eyes: Negative.  Negative for visual disturbance.  Respiratory: Negative.  Negative for cough, choking, chest tightness, shortness of breath and stridor.   Cardiovascular: Positive for leg swelling. Negative for chest pain and palpitations.  Gastrointestinal: Negative.  Negative for nausea, vomiting, abdominal pain, diarrhea, constipation and blood in stool.  Endocrine: Negative for polydipsia, polyphagia and polyuria.  Genitourinary: Negative.  Negative for dysuria, urgency, frequency and difficulty urinating.  Musculoskeletal: Negative.  Negative for myalgias, back pain, joint swelling, arthralgias and neck pain.  Skin: Negative.  Negative for color change and rash.  Allergic/Immunologic: Negative.   Neurological: Negative.  Negative for dizziness, weakness, light-headedness, numbness and headaches.  Hematological: Negative.  Negative for adenopathy. Does not bruise/bleed easily.  Psychiatric/Behavioral: Negative.     Objective:  BP 116/62 mmHg  Pulse 78  Temp(Src) 97.9 F (36.6 C) (Oral)  Resp 16  Ht 6' (1.829 m)  Wt 341 lb 8 oz (154.903 kg)  BMI 46.31 kg/m2  SpO2 98%  Physical Exam  Constitutional: He is oriented to person, place, and time. No distress.  HENT:  Mouth/Throat: Oropharynx is clear and moist. No oropharyngeal exudate.  Eyes: Conjunctivae are normal. Right eye exhibits no discharge. Left eye exhibits no discharge. No scleral icterus.  Neck: Normal range of motion. Neck supple. No JVD present. No tracheal deviation present. No thyromegaly present.  Cardiovascular: Normal rate, regular rhythm, normal heart sounds and intact distal pulses.  Exam reveals no gallop and no friction rub.   No murmur heard. Pulmonary/Chest: Effort normal and breath sounds normal. No stridor. No respiratory distress.  He has no wheezes. He has no rales. He exhibits no tenderness.  Abdominal: Soft. Bowel sounds are normal. He exhibits no distension and no mass. There is no tenderness. There is no rebound and no guarding.  Musculoskeletal: Normal range of motion. He exhibits edema (trace edema in BLE). He exhibits no tenderness.  Lymphadenopathy:    He has no cervical adenopathy.  Neurological: He is oriented to person, place, and time.  Skin: Skin is warm and dry. No rash noted. He is not diaphoretic. No erythema. No pallor.  Vitals reviewed.   Lab Results  Component Value Date   WBC 11.2* 10/20/2015   HGB 14.8 10/20/2015   HCT 44.2 10/20/2015   PLT 234.0 10/20/2015   GLUCOSE 92 10/20/2015   CHOL 235* 10/20/2015   TRIG 273.0* 10/20/2015   HDL 35.20* 10/20/2015   LDLDIRECT 168.0 10/20/2015   LDLCALC 77 12/05/2012   ALT 15 10/20/2015   AST 17 10/20/2015   NA 144 10/20/2015   K 4.0 10/20/2015   CL 103 10/20/2015   CREATININE 1.29 10/20/2015   BUN 26* 10/20/2015   CO2 31 10/20/2015   TSH 2.68 10/20/2015   PSA 0.95 10/20/2015   INR 1.27 06/14/2015   HGBA1C 5.6 10/20/2015   MICROALBUR 1.1 12/12/2006    Assessment & Plan:   Rolley was seen today for  anemia and hyperlipidemia.  Diagnoses and all orders for this visit:  Prediabetes- improvement noted, will continue to work on his lifestyle modifications. -     Comprehensive metabolic panel; Future -     Hemoglobin A1c; Future  H/O laparoscopic adjustable gastric banding & hiatal hernia repair 04/08/13- we'll screen him for vitamin deficiency status post gastric banding -     Vitamin B1; Future -     IBC panel; Future -     Ferritin; Future -     Folate; Future -     Vitamin B12; Future  BPH (benign prostatic hyperplasia)- his PSA is normal so I'm not concerned about prostate cancer, he has no symptoms that need to be treated, will continue finasteride. -     PSA; Future  PAF (paroxysmal atrial fibrillation) ([place])- he has good rate and  rhythm control, will continue anticoagulation with Eliquis -     Thyroid Panel With TSH; Future  Polyneuropathy associated with underlying disease ([place])- will continue B12 replacement therapy -     Vitamin B12; Future  Need for prophylactic vaccination against Streptococcus pneumoniae (pneumococcus) -     Pneumococcal conjugate vaccine 13-valent  Deficiency anemia- his anemia has improved, I will screen him for vitamin deficiencies. -     CBC with Differential/Platelet; Future -     Vitamin B1; Future -     IBC panel; Future -     Ferritin; Future -     Folate; Future -     Vitamin B12; Future  Morbid obesity due to excess calories ([place])- he agrees to work on his lifestyle modifications to help him lose weight.  Hyperlipidemia with target LDL less than 130- his Framingham risk score is 16% so Avastin to start a statin for risk reduction. -     Lipid panel; Future -     Thyroid Panel With TSH; Future -     atorvastatin (LIPITOR) 40 MG tablet; Take 1 tablet (40 mg total) by mouth daily.   I am having Mr. Ellenberg start on atorvastatin. I am also having him maintain his finasteride, multivitamin, Vitamin B-12, CALCIUM PO, DSS, aspirin, loratadine, montelukast, LORazepam, furosemide, pantoprazole, EQ ALLERGY RELIEF, rOPINIRole, risperiDONE, and flecainide.  Meds ordered this encounter  Medications  . atorvastatin (LIPITOR) 40 MG tablet    Sig: Take 1 tablet (40 mg total) by mouth daily.    Dispense:  90 tablet    Refill:  3     Follow-up: Return in about 6 months (around 04/21/2016).  [name], MD

## 2015-10-20 NOTE — Patient Instructions (Signed)
Anemia, Nonspecific Anemia is a condition in which the concentration of red blood cells or hemoglobin in the blood is below normal. Hemoglobin is a substance in red blood cells that carries oxygen to the tissues of the body. Anemia results in not enough oxygen reaching these tissues.  CAUSES  Common causes of anemia include:   Excessive bleeding. Bleeding may be internal or external. This includes excessive bleeding from periods (in women) or from the intestine.   Poor nutrition.   Chronic kidney, thyroid, and liver disease.  Bone marrow disorders that decrease red blood cell production.  Cancer and treatments for cancer.  HIV, AIDS, and their treatments.  Spleen problems that increase red blood cell destruction.  Blood disorders.  Excess destruction of red blood cells due to infection, medicines, and autoimmune disorders. SIGNS AND SYMPTOMS   Minor weakness.   Dizziness.   Headache.  Palpitations.   Shortness of breath, especially with exercise.   Paleness.  Cold sensitivity.  Indigestion.  Nausea.  Difficulty sleeping.  Difficulty concentrating. Symptoms may occur suddenly or they may develop slowly.  DIAGNOSIS  Additional blood tests are often needed. These help your health care provider determine the best treatment. Your health care provider will check your stool for blood and look for other causes of blood loss.  TREATMENT  Treatment varies depending on the cause of the anemia. Treatment can include:   Supplements of iron, vitamin B12, or folic acid.   Hormone medicines.   A blood transfusion. This may be needed if blood loss is severe.   Hospitalization. This may be needed if there is significant continual blood loss.   Dietary changes.  Spleen removal. HOME CARE INSTRUCTIONS Keep all follow-up appointments. It often takes many weeks to correct anemia, and having your health care provider check on your condition and your response to  treatment is very important. SEEK IMMEDIATE MEDICAL CARE IF:   You develop extreme weakness, shortness of breath, or chest pain.   You become dizzy or have trouble concentrating.  You develop heavy vaginal bleeding.   You develop a rash.   You have bloody or black, tarry stools.   You faint.   You vomit up blood.   You vomit repeatedly.   You have abdominal pain.  You have a fever or persistent symptoms for more than 2-3 days.   You have a fever and your symptoms suddenly get worse.   You are dehydrated.  MAKE SURE YOU:  Understand these instructions.  Will watch your condition.  Will get help right away if you are not doing well or get worse.   This information is not intended to replace advice given to you by your health care provider. Make sure you discuss any questions you have with your health care provider.   Document Released: 04/27/2004 Document Revised: 11/20/2012 Document Reviewed: 09/13/2012 Elsevier Interactive Patient Education 2016 Elsevier Inc.  

## 2015-10-21 ENCOUNTER — Encounter: Payer: Self-pay | Admitting: Internal Medicine

## 2015-10-21 ENCOUNTER — Telehealth: Payer: Self-pay | Admitting: Internal Medicine

## 2015-10-21 LAB — THYROID PANEL WITH TSH
FREE THYROXINE INDEX: 2.2 (ref 1.4–3.8)
T3 Uptake: 32 % (ref 22–35)
T4, Total: 7 ug/dL (ref 4.5–12.0)
TSH: 2.68 m[IU]/L (ref 0.40–4.50)

## 2015-10-21 MED ORDER — APIXABAN 5 MG PO TABS: 5.0000 mg | ORAL_TABLET | Freq: Two times a day (BID) | ORAL | Status: DC

## 2015-10-21 NOTE — Telephone Encounter (Signed)
Patient received refill of Eliquis with instructions to take 2 (5mg ) tablets twice a day. Patient was told to start taking 1 tablet two times per day starting 06/22/15. apixaban 5 MG Tabs tablet  Commonly known as: ELIQUIS  Take 1 tablet (5 mg total) by mouth 2 (two) times daily. Start this after finishing your 10mg  doses  Start taking on: 06/22/2015          From discharge summary from hospital. Verified with pharmacy that patient should be taking eliquis 5mg  by mouth two times per day. Patient given information and refill resent with the directions to take 1 tablet two times per day.

## 2015-10-21 NOTE — Telephone Encounter (Signed)
Please call,question about directions on his Eliquis.

## 2015-10-24 LAB — VITAMIN B1: VITAMIN B1 (THIAMINE): 28 nmol/L (ref 8–30)

## 2015-10-29 ENCOUNTER — Other Ambulatory Visit (HOSPITAL_COMMUNITY): Payer: Self-pay | Admitting: Psychiatry

## 2015-10-29 DIAGNOSIS — F330 Major depressive disorder, recurrent, mild: Secondary | ICD-10-CM

## 2015-11-03 MED ORDER — LORAZEPAM 0.5 MG PO TABS: ORAL_TABLET | ORAL | 0 refills | Status: DC

## 2015-11-03 MED ORDER — SERTRALINE HCL 100 MG PO TABS: ORAL_TABLET | ORAL | 0 refills | Status: DC

## 2015-11-12 ENCOUNTER — Other Ambulatory Visit: Payer: Self-pay | Admitting: Internal Medicine

## 2015-11-23 ENCOUNTER — Encounter: Payer: Self-pay | Admitting: Internal Medicine

## 2015-11-23 ENCOUNTER — Ambulatory Visit (INDEPENDENT_AMBULATORY_CARE_PROVIDER_SITE_OTHER): Payer: Medicare Other | Admitting: Internal Medicine

## 2015-11-23 ENCOUNTER — Ambulatory Visit (HOSPITAL_COMMUNITY): Payer: Self-pay | Admitting: Psychiatry

## 2015-11-23 VITALS — BP 124/72 | HR 62 | Ht 70.0 in | Wt 338.0 lb

## 2015-11-23 DIAGNOSIS — I480 Paroxysmal atrial fibrillation: Secondary | ICD-10-CM | POA: Diagnosis not present

## 2015-11-23 DIAGNOSIS — G4733 Obstructive sleep apnea (adult) (pediatric): Secondary | ICD-10-CM | POA: Diagnosis not present

## 2015-11-23 DIAGNOSIS — G4761 Periodic limb movement disorder: Secondary | ICD-10-CM

## 2015-11-23 DIAGNOSIS — I2699 Other pulmonary embolism without acute cor pulmonale: Secondary | ICD-10-CM

## 2015-11-23 NOTE — Assessment & Plan Note (Signed)
Rhythm feels really regular at this exam and may be sinus.-By cardiology.

## 2015-11-23 NOTE — Progress Notes (Signed)
06/18/14- 50 yoM former smoker referred courtesy of Dr Hopper-currently on CPAP through [facility]. Sleep study 2007 NPSG 10/25/05- severe OSA, AHI 79/ hr, CPAP to 16. Frequent limb jerks with sleep disturbance. Has been using CPAP, full face mask, Advanced. Thinks it causes more frequent colds.  He now needs old machine replaced- buttons sticking. Limb jerks do bother him at night- interested in trying treatment. Bedtime 11PM, latency 15-30 minutes, waking once before up at 10AM. Weight down 100 lbs in past 2 years after diet and lab-band bariatric surgery. HBP, asthma.. Medical problems with PAFib, hx PE- finished anticoagulation due to GI bleed. Bilateral TKR. Hx pericardial window sgy for pericardial effusion.  08/20/14- 68 yoM former smoker followed for OSA, limb jerks,  complicated by AFib, hx PE, bariatric surgery,  PT. WEARS CPAP 16/ Advanced EVERYNIGHT, NO PROBLEMS OR CONCERNS Weight back up to 321 lbs Had to skip CPAP for awhile for facial surgery. Pressure good.  Requip 0.25 x 2 works well for leg jerks.  11/20/14- 62 yoM former smoker followed for OSA, limb jerks,  complicated by AFib, hx PE, bariatric surgery, FOLLOWS FOR: Pt states he is wearing CPAP 16/ Advanced nightly for about 8 hours. Pt denies issues with mask, pressure, or machine.  Download confirms great compliance and control CPAP 16/ Advanced. New machine in past year.  11/23/15-70 year old male former smoker followed for OSA, Restless Legs,, complicated by A. fib, recurrent PE, bariatric surgery CPAP 16/Advanced> today cange to auto 10-20 FOLLOWS FOR: DME: [facility]. Pt wears CPAP nightly; DL attached. Pt is unsure if supplies are needed(place order new supplies and instruct patient on how often he can get new supplies.). Hospitalized with recurrent pulmonary embolism in March and now on Eliquis chronically. Remote history of GI bleed while on Xarelto after his first PE. Ropinirole has been a big help for leg jerks. CPAP fullface  mask is uncomfortable but he puts up with it. Download 100%/4 hour compliance at 16 CWP with residual AHI 7.1 He denies chest discomfort, cough, wheeze, or bleeding  ROS-see HPI   Negative unless "+" Constitutional:    weight loss, night sweats, fevers, chills, fatigue, lassitude. HEENT:    headaches, difficulty swallowing, tooth/dental problems, sore throat,       sneezing, itching, ear ache, nasal congestion, post nasal drip, snoring CV:    chest pain, orthopnea, PND, swelling in lower extremities, anasarca,                                                   dizziness, +palpitations Resp:   +shortness of breath with exertion or at rest.                productive cough,   non-productive cough, coughing up of blood.              change in color of mucus.  wheezing.   Skin:    rash or lesions. GI:  No-   heartburn, indigestion, abdominal pain, nausea, vomiting,  GU:  MS:   joint pain, stiffness, Neuro-     nothing unusual Psych:  change in mood or affect.  depression or anxiety.   memory loss.  OBJ- Physical Exam General- Alert, Oriented, Affect-appropriate, Distress- none acute, +obese Skin- rash-none, lesions- none, excoriation- none Lymphadenopathy- none Head- atraumatic  Eyes- Gross vision intact, PERRLA, conjunctivae and secretions clear            Ears- +Hearing aid            Nose- Clear, no-Septal dev, mucus, polyps, erosion, perforation             Throat- Mallampati III , mucosa clear , drainage- none, tonsils- atrophic Neck- flexible , trachea midline, no stridor , thyroid nl, carotid no bruit Chest - symmetrical excursion , unlabored           Heart/CV- RRR , no murmur , no gallop  , no rub, nl s1 s2                           - JVD- none , edema- none, stasis changes- none, varices- none           Lung- clear to P&A, wheeze- none, cough- none , dullness-none, rub- none           Chest wall-  Abd-  Br/ Gen/ Rectal- Not done, not indicated Extrem- cyanosis- none,  clubbing, none, atrophy- none, strength- nl Neuro- grossly intact to observation

## 2015-11-23 NOTE — Assessment & Plan Note (Signed)
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 Agree with chronic Eliquis,, recognizing past history of GI bleed while on Xarelto.

## 2015-11-23 NOTE — Assessment & Plan Note (Signed)
Compliance and control her adequate but I would like a little better AHI we can get it. Available download does not distinguish between central and obstructive events. Plan-if his machine allows AutoPap we will change to auto 10-20. Asked DME to refit mask.

## 2015-11-23 NOTE — Patient Instructions (Signed)
Order- DME Advanced- Please change CPAP to auto 10-20 if available; Please refit mask of choice for better fit, continue supplies, humidifier, AirView    Dx OSA  Ok to continue ropinirole for leg jerks  Please call as needed

## 2015-11-23 NOTE — Assessment & Plan Note (Signed)
Ropinirole is definitely help. Still aware of a few limb jerks but does not need higher dose at this time.

## 2015-12-02 ENCOUNTER — Telehealth: Payer: Self-pay | Admitting: Internal Medicine

## 2015-12-02 DIAGNOSIS — G4733 Obstructive sleep apnea (adult) (pediatric): Secondary | ICD-10-CM

## 2015-12-02 NOTE — Telephone Encounter (Signed)
Spoke with [place] at [facility]. States that an order needs to placed for an auto loaner CPAP. This order has been placed. Corene Cornea is aware that CY is not back in the office until 12/09/15. Will route the message to CY to make him aware that he needs to sign this order.

## 2015-12-10 NOTE — Telephone Encounter (Signed)
Called [facility] and spoke with Corene Cornea to inform him that the order has been signed Nothing further needed; will sign off

## 2015-12-10 NOTE — Telephone Encounter (Signed)
CY please advise if this order has been electronically signed. Thanks.

## 2015-12-10 NOTE — Telephone Encounter (Signed)
All my orders are signed

## 2015-12-17 ENCOUNTER — Other Ambulatory Visit (HOSPITAL_COMMUNITY): Payer: Self-pay

## 2015-12-17 DIAGNOSIS — F330 Major depressive disorder, recurrent, mild: Secondary | ICD-10-CM

## 2015-12-17 MED ORDER — SERTRALINE HCL 100 MG PO TABS: ORAL_TABLET | ORAL | 0 refills | Status: DC

## 2015-12-17 MED ORDER — LORAZEPAM 0.5 MG PO TABS: ORAL_TABLET | ORAL | 0 refills | Status: DC

## 2015-12-17 MED ORDER — RISPERIDONE 0.5 MG PO TABS: 0.5000 mg | ORAL_TABLET | Freq: Every day | ORAL | 0 refills | Status: DC

## 2015-12-20 ENCOUNTER — Encounter: Payer: Self-pay | Admitting: Internal Medicine

## 2015-12-24 DIAGNOSIS — N400 Enlarged prostate without lower urinary tract symptoms: Secondary | ICD-10-CM | POA: Diagnosis not present

## 2015-12-24 LAB — PSA: PSA: 0.68

## 2015-12-31 DIAGNOSIS — N401 Enlarged prostate with lower urinary tract symptoms: Secondary | ICD-10-CM | POA: Diagnosis not present

## 2015-12-31 DIAGNOSIS — N200 Calculus of kidney: Secondary | ICD-10-CM | POA: Diagnosis not present

## 2015-12-31 DIAGNOSIS — N5201 Erectile dysfunction due to arterial insufficiency: Secondary | ICD-10-CM | POA: Diagnosis not present

## 2015-12-31 DIAGNOSIS — Z125 Encounter for screening for malignant neoplasm of prostate: Secondary | ICD-10-CM | POA: Diagnosis not present

## 2015-12-31 DIAGNOSIS — N138 Other obstructive and reflux uropathy: Secondary | ICD-10-CM | POA: Diagnosis not present

## 2016-01-08 ENCOUNTER — Other Ambulatory Visit: Payer: Self-pay | Admitting: Internal Medicine

## 2016-01-10 ENCOUNTER — Encounter: Payer: Self-pay | Admitting: Internal Medicine

## 2016-01-10 ENCOUNTER — Ambulatory Visit (HOSPITAL_COMMUNITY): Payer: Self-pay | Admitting: Psychiatry

## 2016-01-14 ENCOUNTER — Telehealth: Payer: Self-pay | Admitting: Internal Medicine

## 2016-01-14 ENCOUNTER — Other Ambulatory Visit: Payer: Self-pay | Admitting: Internal Medicine

## 2016-01-14 NOTE — Telephone Encounter (Signed)
atc pt, no answer, no vm set up.  Wcb.  

## 2016-01-14 NOTE — Telephone Encounter (Signed)
Called and spoke with pt and he is aware of CY recs.  He stated that his cpap mask has rubbed up against his eye and he stated that he has a blood blister on his eye.  He stated that he will not use the cpap until this clears up, so it may take a few days.  He wanted CY to know why he will miss these days on the cpap.  He will stop by [facility] next week to see if they can help him adjust his mask so this will not happen again. Will forward back to CY as an [place].

## 2016-01-14 NOTE — Telephone Encounter (Signed)
ATC patient-unable to leave message as no voicemail set up. Will have to try again later.

## 2016-01-14 NOTE — Telephone Encounter (Signed)
Called and spoke with pt and he stated that he would like the results of the auto titration that was done back in September.  He stated that we could release these results to my chart if that would be easier.  CY please advise. Thanks  Last ov--11/23/15 Next of--no pending appt  Allergies  Allergen Reactions  . Morphine Other (See Comments)    Nausea and Severe hallucinations  . Codeine Nausea Only  . Penicillins Other (See Comments)    LOC with shot  . Propoxyphene N-Acetaminophen Nausea Only

## 2016-01-14 NOTE — Telephone Encounter (Signed)
I don't know how to get the result into My Chart.  He had excellent compliance, using CPAP every night.Cotrol was good enough, but not perfect, with residual AHI 8.5/ h. We would like to get down to an AHI of 5 apnea events/ hour, but 8.5 is probably about as good as we can get. His CPAP is already set near the top of its range.

## 2016-01-14 NOTE — Telephone Encounter (Signed)
Pt returning.[name]

## 2016-01-18 ENCOUNTER — Telehealth: Payer: Self-pay | Admitting: Internal Medicine

## 2016-01-18 NOTE — Telephone Encounter (Signed)
New Message  Pt voiced he's getting a head start on his appeal for his Eliquis per [facility]  Member ID: [id number] Provider phone#: [phone]  Pt states the letter needs to state- THIS IS APPEAL for tier exception and pt is trying to get ahead start on his Eliquis which program ends 04/02/2016 with BID#60 per 30 days reduced to tier 3.  Pt voiced he would like copy of original appeal letter sent and this letter that'll be sent also.  Pt voiced he'll be on vacation Thurs-Sun if someone were to call to reach him on his cell.  Please f/u

## 2016-01-18 NOTE — Telephone Encounter (Signed)
Spoke with [facility] and was advised they were not accepting any Tier exception request until after Feb 02, 2016 Advised patient office would try after then

## 2016-01-20 ENCOUNTER — Ambulatory Visit (INDEPENDENT_AMBULATORY_CARE_PROVIDER_SITE_OTHER): Payer: Medicare Other

## 2016-01-20 DIAGNOSIS — Z23 Encounter for immunization: Secondary | ICD-10-CM

## 2016-01-20 NOTE — Addendum Note (Signed)
Addended by: Meriam Sprague D on: 01/20/2016 09:19 AM   Modules accepted: Orders

## 2016-01-28 NOTE — Telephone Encounter (Signed)
Pt is calling to see if his insurance out corrected yet

## 2016-01-28 NOTE — Telephone Encounter (Signed)
Samples of Eliquis 5 mg #3 [id number] exp Mar 2020 Advised patient at front for pick up

## 2016-02-02 ENCOUNTER — Encounter: Payer: Self-pay | Admitting: *Deleted

## 2016-02-02 ENCOUNTER — Telehealth: Payer: Self-pay | Admitting: Internal Medicine

## 2016-02-02 NOTE — Telephone Encounter (Signed)
Advised patient letter has been faxed to insurance for appeals process

## 2016-02-02 NOTE — Telephone Encounter (Signed)
Appeal letter generated and faxed to [employer] @ [phone]. Copy of letter mailed to [employer] and the patient. Patient made aware of above.

## 2016-02-02 NOTE — Telephone Encounter (Signed)
Pt is calling to speak with Rip Harbour regarding a letter sent to [facility] for his Eliquis and states they denied it by phone. Will like to start the appeal [phone] for customer service.

## 2016-02-06 ENCOUNTER — Other Ambulatory Visit: Payer: Self-pay | Admitting: Internal Medicine

## 2016-02-14 ENCOUNTER — Other Ambulatory Visit (HOSPITAL_COMMUNITY): Payer: Self-pay

## 2016-02-14 MED ORDER — RISPERIDONE 0.5 MG PO TABS: 0.5000 mg | ORAL_TABLET | Freq: Every day | ORAL | 0 refills | Status: DC

## 2016-02-21 ENCOUNTER — Other Ambulatory Visit (HOSPITAL_COMMUNITY): Payer: Self-pay | Admitting: Psychiatry

## 2016-02-21 DIAGNOSIS — F330 Major depressive disorder, recurrent, mild: Secondary | ICD-10-CM

## 2016-02-29 ENCOUNTER — Ambulatory Visit (INDEPENDENT_AMBULATORY_CARE_PROVIDER_SITE_OTHER): Payer: Medicare Other | Admitting: Psychiatry

## 2016-02-29 ENCOUNTER — Encounter (HOSPITAL_COMMUNITY): Payer: Self-pay | Admitting: Psychiatry

## 2016-02-29 DIAGNOSIS — F330 Major depressive disorder, recurrent, mild: Secondary | ICD-10-CM | POA: Diagnosis not present

## 2016-02-29 DIAGNOSIS — Z79899 Other long term (current) drug therapy: Secondary | ICD-10-CM

## 2016-02-29 MED ORDER — LORAZEPAM 0.5 MG PO TABS: ORAL_TABLET | ORAL | 2 refills | Status: DC

## 2016-02-29 MED ORDER — SERTRALINE HCL 100 MG PO TABS: ORAL_TABLET | ORAL | 2 refills | Status: DC

## 2016-02-29 MED ORDER — RISPERIDONE 0.5 MG PO TABS: 0.5000 mg | ORAL_TABLET | Freq: Every day | ORAL | 2 refills | Status: DC

## 2016-02-29 NOTE — Progress Notes (Signed)
[facility] Progress Note  [name] [id number] 70 y.o.  02/29/2016 1:45 PM  Chief Complaint:  I'm doing much better on medication.  I don't get angry or irritable.  I'm excited because I will be a grand father few weeks.             History of Present Illness:  [name] came for his follow-up appointment.  He is taking his medication as prescribed.  He denies any irritability, anger, mania or psychosis.  He likes his current psychiatric medication which is helping his mood.  Patient felt much better with the medication and does not want to change or reduce the dose.  He is excited because he is having a grand daughter in 2 weeks.  He sleeping better with CPAP machine.  He admitted weight gain and some time lack of energy but denies any feeling of hopelessness or worthlessness.  He denies any paranoia or any hallucination.  His appetite is okay.  His vital signs are stable.  He lives with his wife is very supportive.  Patient denies drinking alcohol or using any illegal substances.  Suicidal Ideation: No Plan Formed: No Patient has means to carry out plan: No  Homicidal Ideation: No Plan Formed: No Patient has means to carry out plan: No  Review of Systems: Psychiatric: Agitation: No Hallucination: No Depressed Mood: No Insomnia: No Hypersomnia: No Altered Concentration: No Feels Worthless: No Grandiose Ideas: No Belief In Special Powers: No New/Increased Substance Abuse: No Compulsions: No  Neurologic: Headache: No Seizure: No Paresthesias: No  Past Medical History:  Diagnosis Date  . Anemia 07/29/2013  . Asthma    childhood asthma  . Benign prostatic hypertrophy    several prostate biopsies neg for cancer.   . Bilateral hip pain   . Cancer ([place])   . CHF (congestive heart failure) ([place])   . Complication of anesthesia    MORPHINE CAUSES HALLUCINATIONS  . Depression   . FH: colonic polyps 2010  . Gout   . History of kidney stones   .  Hypertension   . Kidney cysts   . Morbid obesity ([place])   . Numbness    feet / legs  . PAF (paroxysmal atrial fibrillation) with RVR 07/29/2013  . Sleep apnea    USES C PAP    Outpatient Encounter Prescriptions as of 02/29/2016  Medication Sig Dispense Refill  . apixaban (ELIQUIS) 5 MG TABS tablet Take 1 tablet (5 mg total) by mouth 2 (two) times daily. 180 tablet 3  . aspirin 81 MG tablet Take 81 mg by mouth 2 (two) times daily.     [name] atorvastatin (LIPITOR) 40 MG tablet Take 1 tablet (40 mg total) by mouth daily. 90 tablet 3  . CALCIUM PO Take 500 mg by mouth 3 (three) times daily.     . Cyanocobalamin (VITAMIN B-12) 1000 MCG SUBL Place 1,000 mcg under the tongue every evening.     [name] Sodium (DSS) 100 MG CAPS Take 100 mg by mouth every other day.    Noelle Penner ALLERGY RELIEF 10 MG tablet TAKE ONE TABLET BY MOUTH ONCE DAILY 30 tablet 3  . EQ ALLERGY RELIEF 10 MG tablet TAKE ONE TABLET BY MOUTH ONCE DAILY 90 tablet 2  . finasteride (PROSCAR) 5 MG tablet Take 5 mg by mouth daily.     . flecainide (TAMBOCOR) 50 MG tablet TAKE ONE TABLET BY MOUTH EVERY 12 HOURS 180 tablet 2  . furosemide (LASIX) 20 MG  tablet TAKE 1 TAB ONCE A DAY AS NEEDED FOR SWELLING IN THE LEGS 90 tablet 1  . loratadine (CLARITIN) 10 MG tablet Take 10 mg by mouth daily.    [name] LORazepam (ATIVAN) 0.5 MG tablet TAKE ONE TABLET BY MOUTH ONCE DAILY IN THE MORNING AND TWO AT BEDTIME 90 tablet 2  . montelukast (SINGULAIR) 10 MG tablet TAKE ONE TABLET BY MOUTH AT BEDTIME 30 tablet 11  . Multiple Vitamin (MULTIVITAMIN) tablet Take 1 tablet by mouth daily. Chewable tablet with iron    . pantoprazole (PROTONIX) 40 MG tablet TAKE ONE TABLET BY MOUTH TWICE DAILY 180 tablet 2  . risperiDONE (RISPERDAL) 0.5 MG tablet Take 1 tablet (0.5 mg total) by mouth at bedtime. 30 tablet 2  . rOPINIRole (REQUIP) 0.25 MG tablet TAKE THREE TABLETS BY MOUTH AT BEDTIME OR  AS  DIRECTED  FOR  LEG  JERKS 90 tablet 11  . sertraline (ZOLOFT) 100 MG tablet  TAKE ONE AND ONE-HALF TABLETS BY MOUTH ONCE DAILY WITH  BREAKFAST 45 tablet 2  . [DISCONTINUED] LORazepam (ATIVAN) 0.5 MG tablet TAKE ONE TABLET BY MOUTH ONCE DAILY IN THE MORNING AND TWO AT BEDTIME 90 tablet 0  . [DISCONTINUED] risperiDONE (RISPERDAL) 0.5 MG tablet Take 1 tablet (0.5 mg total) by mouth at bedtime. 30 tablet 0  . [DISCONTINUED] sertraline (ZOLOFT) 100 MG tablet TAKE ONE AND ONE-HALF TABLETS BY MOUTH ONCE DAILY WITH  BREAKFAST 45 tablet 0   No facility-administered encounter medications on file as of 02/29/2016.     Past Psychiatric History/Hospitalization(s) Patient has one psychiatric admission in 2006 due to increased depression and having suicidal thoughts.  Since then patient has been seen in this office and stable on his psychiatric medication. Anxiety: Yes Bipolar Disorder: No Depression: Yes Mania: No Psychosis: No Schizophrenia: No Personality Disorder: No Hospitalization for psychiatric illness: Yes History of Electroconvulsive Shock Therapy: No Prior Suicide Attempts: No  Physical Exam: Constitutional:  BP (!) 148/84 (BP Location: Right Arm, Patient Position: Sitting, Cuff Size: Large)   Pulse 82   Ht 6' (1.829 m)   Wt (!) 347 lb (157.4 kg)   BMI 47.06 kg/m   Musculoskeletal: Strength & Muscle Tone: within normal limits Gait & Station: normal Patient leans: N/A  Mental Status Examination;  Patient is casually dressed and fairly groomed.  He maintained fair eye contact.  He is pleasant and cooperative .  His his speech is slow but clear and coherent.  He described his mood good and his affect is appropriate.  He denies any active or passive suicidal thoughts or homicidal thought.  There were no delusions, paranoia or any obsessive thoughts.  His psychomotor activity is normal.  His attention and concentration is good.  There were no tremors shakes or any EPS.  His thought processes logical and goal-directed.  He is alert and oriented 3.  His fund of  knowledge is good.  His insight judgment and impulse control is okay.  Established Problem, Stable/Improving (1), Review of Last Therapy Session (1) and Review of Medication Regimen & Side Effects (2)  Assessment: Axis I: Maj. depressive disorder, recurrent  Axis II: Deferred  Axis III:  Past Medical History:  Diagnosis Date  . Anemia 07/29/2013  . Asthma    childhood asthma  . Benign prostatic hypertrophy    several prostate biopsies neg for cancer.   . Bilateral hip pain   . Cancer ([place])   . CHF (congestive heart failure) ([place])   . Complication of anesthesia  MORPHINE CAUSES HALLUCINATIONS  . Depression   . FH: colonic polyps 2010  . Gout   . History of kidney stones   . Hypertension   . Kidney cysts   . Morbid obesity ([place])   . Numbness    feet / legs  . PAF (paroxysmal atrial fibrillation) with RVR 07/29/2013  . Sleep apnea    USES C PAP   Plan:  Patient is a stable on his current psychiatric medication.  He is excited about having a grandchild in 2 weeks.  He does not want to change his medication.  I will continue Zoloft 100 mg daily, lorazepam 0.5 mg in the morning and 1 mg at bedtime.  He will also continue Risperdal 0.5 mg at bedtime.  He has no concerns.  Recommended to call us back if he has any question or any concern.  Follow-up in 3 months.  Susumu Hackler T., MD 02/29/2016

## 2016-03-29 ENCOUNTER — Other Ambulatory Visit: Payer: Self-pay | Admitting: Internal Medicine

## 2016-04-22 ENCOUNTER — Telehealth: Payer: Self-pay

## 2016-04-22 DIAGNOSIS — E785 Hyperlipidemia, unspecified: Secondary | ICD-10-CM

## 2016-04-22 MED ORDER — PANTOPRAZOLE SODIUM 40 MG PO TBEC: 40.0000 mg | DELAYED_RELEASE_TABLET | Freq: Two times a day (BID) | ORAL | 0 refills | Status: DC

## 2016-04-22 MED ORDER — ATORVASTATIN CALCIUM 40 MG PO TABS: 40.0000 mg | ORAL_TABLET | Freq: Every day | ORAL | 0 refills | Status: DC

## 2016-04-22 NOTE — Telephone Encounter (Signed)
Pt changed pharmacies.   rx for maintenance medications requested.  erx sent.  Pt has an appt on 04/26/2016 Pharmacy updated

## 2016-04-26 ENCOUNTER — Other Ambulatory Visit (INDEPENDENT_AMBULATORY_CARE_PROVIDER_SITE_OTHER): Payer: Medicare Other

## 2016-04-26 ENCOUNTER — Ambulatory Visit (INDEPENDENT_AMBULATORY_CARE_PROVIDER_SITE_OTHER): Payer: Medicare Other | Admitting: Internal Medicine

## 2016-04-26 ENCOUNTER — Encounter: Payer: Self-pay | Admitting: Internal Medicine

## 2016-04-26 VITALS — BP 138/88 | HR 67 | Temp 98.3°F | Resp 16 | Ht 72.0 in | Wt 350.0 lb

## 2016-04-26 DIAGNOSIS — L989 Disorder of the skin and subcutaneous tissue, unspecified: Secondary | ICD-10-CM | POA: Diagnosis not present

## 2016-04-26 DIAGNOSIS — E785 Hyperlipidemia, unspecified: Secondary | ICD-10-CM

## 2016-04-26 DIAGNOSIS — I10 Essential (primary) hypertension: Secondary | ICD-10-CM

## 2016-04-26 DIAGNOSIS — E781 Pure hyperglyceridemia: Secondary | ICD-10-CM | POA: Insufficient documentation

## 2016-04-26 LAB — BASIC METABOLIC PANEL
BUN: 22 mg/dL (ref 6–23)
CHLORIDE: 106 meq/L (ref 96–112)
CO2: 31 mEq/L (ref 19–32)
Calcium: 8.9 mg/dL (ref 8.4–10.5)
Creatinine, Ser: 1.2 mg/dL (ref 0.40–1.50)
GFR: 63.55 mL/min (ref >60.00)
Glucose, Bld: 101 mg/dL — ABNORMAL HIGH (ref 70–99)
POTASSIUM: 4.2 meq/L (ref 3.5–5.1)
Sodium: 144 mEq/L (ref 135–145)

## 2016-04-26 LAB — CBC WITH DIFFERENTIAL/PLATELET
BASOS PCT: 0.8 % (ref 0.0–3.0)
Basophils Absolute: 0.1 10*3/uL (ref 0.0–0.1)
EOS PCT: 2.1 % (ref 0.0–5.0)
Eosinophils Absolute: 0.2 10*3/uL (ref 0.0–0.7)
HEMATOCRIT: 42.4 % (ref 39.0–52.0)
HEMOGLOBIN: 14.4 g/dL (ref 13.0–17.0)
LYMPHS PCT: 18.7 % (ref 12.0–46.0)
Lymphs Abs: 1.5 10*3/uL (ref 0.7–4.0)
MCHC: 34 g/dL (ref 30.0–36.0)
MCV: 85.4 fl (ref 78.0–100.0)
MONO ABS: 0.8 10*3/uL (ref 0.1–1.0)
MONOS PCT: 9.3 % (ref 3.0–12.0)
Neutro Abs: 5.6 10*3/uL (ref 1.4–7.7)
Neutrophils Relative %: 69.1 % (ref 43.0–77.0)
Platelets: 210 10*3/uL (ref 150.0–400.0)
RBC: 4.96 Mil/uL (ref 4.22–5.81)
RDW: 14.2 % (ref 11.5–15.5)
WBC: 8.1 10*3/uL (ref 4.0–10.5)

## 2016-04-26 LAB — LIPID PANEL
CHOL/HDL RATIO: 4
Cholesterol: 141 mg/dL (ref 0–200)
HDL: 36.3 mg/dL — ABNORMAL LOW (ref >39.00)
LDL CALC: 81 mg/dL (ref 0–99)
NONHDL: 105.08
Triglycerides: 120 mg/dL (ref 0.0–149.0)
VLDL: 24 mg/dL (ref 0.0–40.0)

## 2016-04-26 NOTE — Patient Instructions (Signed)
Food Choices to Lower Your Triglycerides Triglycerides are a type of fat in your blood. High levels of triglycerides can increase the risk of heart disease and stroke. If your triglyceride levels are high, the foods you eat and your eating habits are very important. Choosing the right foods can help lower your triglycerides. What general guidelines do I need to follow?  Lose weight if you are overweight.  Limit or avoid alcohol.  Fill one half of your plate with vegetables and green salads.  Limit fruit to two servings a day. Choose fruit instead of juice.  Make one fourth of your plate whole grains. Look for the word "whole" as the first word in the ingredient list.  Fill one fourth of your plate with lean protein foods.  Enjoy fatty fish (such as salmon, mackerel, sardines, and tuna) three times a week.  Choose healthy fats.  Limit foods high in starch and sugar.  Eat more home-cooked food and less restaurant, buffet, and fast food.  Limit fried foods.  Cook foods using methods other than frying.  Limit saturated fats.  Check ingredient lists to avoid foods with partially hydrogenated oils (trans fats) in them. What foods can I eat? Grains  Whole grains, such as whole wheat or whole grain breads, crackers, cereals, and pasta. Unsweetened oatmeal, bulgur, barley, quinoa, or brown rice. Corn or whole wheat flour tortillas. Vegetables  Fresh or frozen vegetables (raw, steamed, roasted, or grilled). Green salads. Fruits  All fresh, canned (in natural juice), or frozen fruits. Meat and Other Protein Products  Ground beef (85% or leaner), grass-fed beef, or beef trimmed of fat. Skinless chicken or turkey. Ground chicken or turkey. Pork trimmed of fat. All fish and seafood. Eggs. Dried beans, peas, or lentils. Unsalted nuts or seeds. Unsalted canned or dry beans. Dairy  Low-fat dairy products, such as skim or 1% milk, 2% or reduced-fat cheeses, low-fat ricotta or cottage cheese,  or plain low-fat yogurt. Fats and Oils  Tub margarines without trans fats. Light or reduced-fat mayonnaise and salad dressings. Avocado. Safflower, olive, or canola oils. Natural peanut or almond butter. The items listed above may not be a complete list of recommended foods or beverages. Contact your dietitian for more options.  What foods are not recommended? Grains  White bread. White pasta. White rice. Cornbread. Bagels, pastries, and croissants. Crackers that contain trans fat. Vegetables  White potatoes. Corn. Creamed or fried vegetables. Vegetables in a cheese sauce. Fruits  Dried fruits. Canned fruit in light or heavy syrup. Fruit juice. Meat and Other Protein Products  Fatty cuts of meat. Ribs, chicken wings, bacon, sausage, bologna, salami, chitterlings, fatback, hot dogs, bratwurst, and packaged luncheon meats. Dairy  Whole or 2% milk, cream, half-and-half, and cream cheese. Whole-fat or sweetened yogurt. Full-fat cheeses. Nondairy creamers and whipped toppings. Processed cheese, cheese spreads, or cheese curds. Sweets and Desserts  Corn syrup, sugars, honey, and molasses. Candy. Jam and jelly. Syrup. Sweetened cereals. Cookies, pies, cakes, donuts, muffins, and ice cream. Fats and Oils  Butter, stick margarine, lard, shortening, ghee, or bacon fat. Coconut, palm kernel, or palm oils. Beverages  Alcohol. Sweetened drinks (such as sodas, lemonade, and fruit drinks or punches). The items listed above may not be a complete list of foods and beverages to avoid. Contact your dietitian for more information.  This information is not intended to replace advice given to you by your health care provider. Make sure you discuss any questions you have with your health care provider. Document   Released: 01/06/2004 Document Revised: 08/26/2015 Document Reviewed: 01/22/2013 Elsevier Interactive Patient Education  2017 Elsevier Inc.  

## 2016-04-26 NOTE — Progress Notes (Signed)
Subjective:  Patient ID: [name], male    DOB: 1945-08-23  Age: 72 y.o. MRN: [id number]  CC: Hypertension; Hyperlipidemia; and Atrial Fibrillation   HPI [name] presents for follow-up on the above medical problems. He tells me his blood pressure has been well controlled. He has chronic DOE that has not changed recently. He's had a 3 pound weight gain over the last 2 months but denies edema, fatigue, or palpitations. He complains of a red lesion on his left anterior chest wall that is been present for several months. He doesn't think it bothers him but he's concerned about it.  Outpatient Medications Prior to Visit  Medication Sig Dispense Refill  . apixaban (ELIQUIS) 5 MG TABS tablet Take 1 tablet (5 mg total) by mouth 2 (two) times daily. 180 tablet 3  . aspirin 81 MG tablet Take 81 mg by mouth 2 (two) times daily.     [name] atorvastatin (LIPITOR) 40 MG tablet Take 1 tablet (40 mg total) by mouth daily. 90 tablet 0  . CALCIUM PO Take 500 mg by mouth 3 (three) times daily.     . Cyanocobalamin (VITAMIN B-12) 1000 MCG SUBL Place 1,000 mcg under the tongue every evening.     [name] Sodium (DSS) 100 MG CAPS Take 100 mg by mouth every other day.    . finasteride (PROSCAR) 5 MG tablet Take 5 mg by mouth daily.     . flecainide (TAMBOCOR) 50 MG tablet TAKE ONE TABLET BY MOUTH EVERY 12 HOURS 180 tablet 2  . furosemide (LASIX) 20 MG tablet TAKE 1 TAB ONCE A DAY AS NEEDED FOR SWELLING IN THE LEGS 90 tablet 1  . LORazepam (ATIVAN) 0.5 MG tablet TAKE ONE TABLET BY MOUTH ONCE DAILY IN THE MORNING AND TWO AT BEDTIME 90 tablet 2  . montelukast (SINGULAIR) 10 MG tablet TAKE ONE TABLET BY MOUTH AT BEDTIME 30 tablet 11  . pantoprazole (PROTONIX) 40 MG tablet Take 1 tablet (40 mg total) by mouth 2 (two) times daily. 180 tablet 0  . risperiDONE (RISPERDAL) 0.5 MG tablet Take 1 tablet (0.5 mg total) by mouth at bedtime. 30 tablet 2  . rOPINIRole (REQUIP) 0.25 MG tablet TAKE THREE TABLETS BY  MOUTH AT BEDTIME OR  AS  DIRECTED  FOR  LEG  JERKS 90 tablet 11  . sertraline (ZOLOFT) 100 MG tablet TAKE ONE AND ONE-HALF TABLETS BY MOUTH ONCE DAILY WITH  BREAKFAST 45 tablet 2  . EQ ALLERGY RELIEF 10 MG tablet TAKE ONE TABLET BY MOUTH ONCE DAILY 30 tablet 3  . EQ ALLERGY RELIEF 10 MG tablet TAKE ONE TABLET BY MOUTH ONCE DAILY 90 tablet 2  . loratadine (CLARITIN) 10 MG tablet Take 10 mg by mouth daily.    . Multiple Vitamin (MULTIVITAMIN) tablet Take 1 tablet by mouth daily. Chewable tablet with iron     No facility-administered medications prior to visit.     ROS Review of Systems  Constitutional: Positive for unexpected weight change. Negative for appetite change, chills, diaphoresis and fatigue.  HENT: Negative.   Eyes: Negative for visual disturbance.  Respiratory: Negative for cough, chest tightness, shortness of breath, wheezing and stridor.   Cardiovascular: Negative for chest pain, palpitations and leg swelling.  Gastrointestinal: Negative for abdominal pain, constipation, diarrhea, nausea and vomiting.  Endocrine: Negative.  Negative for cold intolerance and heat intolerance.  Genitourinary: Negative.  Negative for difficulty urinating.  Musculoskeletal: Negative.  Negative for back pain and neck pain.  Skin:  Positive for color change. Negative for pallor and rash.  Allergic/Immunologic: Negative.   Neurological: Negative.  Negative for dizziness, weakness and numbness.  Hematological: Negative for adenopathy. Does not bruise/bleed easily.  Psychiatric/Behavioral: Negative.     Objective:  BP 138/88 (BP Location: Left Arm, Patient Position: Sitting, Cuff Size: Large)   Pulse 67   Temp 98.3 F (36.8 C) (Oral)   Resp 16   Ht 6' (1.829 m)   Wt (!) 350 lb (158.8 kg)   SpO2 95%   BMI 47.47 kg/m   BP Readings from Last 3 Encounters:  04/26/16 138/88  11/23/15 124/72  10/20/15 116/62    Wt Readings from Last 3 Encounters:  04/26/16 (!) 350 lb (158.8 kg)  11/23/15  (!) 338 lb (153.3 kg)  10/20/15 (!) 341 lb 8 oz (154.9 kg)    Physical Exam  Constitutional: He is oriented to person, place, and time. No distress.  HENT:  Mouth/Throat: Oropharynx is clear and moist. No oropharyngeal exudate.  Eyes: Conjunctivae are normal. Right eye exhibits no discharge. Left eye exhibits no discharge. No scleral icterus.  Neck: Normal range of motion. Neck supple. No JVD present. No tracheal deviation present. No thyromegaly present.  Cardiovascular: Normal rate, regular rhythm, normal heart sounds and intact distal pulses.  Exam reveals no gallop and no friction rub.   No murmur heard. Pulmonary/Chest: Effort normal and breath sounds normal. No stridor. No respiratory distress. He has no wheezes. He has no rales. He exhibits no tenderness.  Abdominal: Soft. Bowel sounds are normal. He exhibits no distension and no mass. There is no tenderness. There is no rebound and no guarding.  Musculoskeletal: Normal range of motion. He exhibits no edema, tenderness or deformity.  Lymphadenopathy:    He has no cervical adenopathy.  Neurological: He is oriented to person, place, and time.  Skin: Skin is warm and dry. Lesion noted. No rash noted. He is not diaphoretic. No erythema. No pallor.     He complains of a res skin lesion over his left upper anterior chest wall  Vitals reviewed.   Lab Results  Component Value Date   WBC 8.1 04/26/2016   HGB 14.4 04/26/2016   HCT 42.4 04/26/2016   PLT 210.0 04/26/2016   GLUCOSE 101 (H) 04/26/2016   CHOL 141 04/26/2016   TRIG 120.0 04/26/2016   HDL 36.30 (L) 04/26/2016   LDLDIRECT 168.0 10/20/2015   LDLCALC 81 04/26/2016   ALT 15 10/20/2015   AST 17 10/20/2015   NA 144 04/26/2016   K 4.2 04/26/2016   CL 106 04/26/2016   CREATININE 1.20 04/26/2016   BUN 22 04/26/2016   CO2 31 04/26/2016   TSH 2.68 10/20/2015   PSA 0.68 12/24/2015   INR 1.27 06/14/2015   HGBA1C 5.6 10/20/2015   MICROALBUR 1.1 12/12/2006    Dg Chest 2  View  Result Date: 06/13/2015 CLINICAL DATA:  Nonproductive cough. EXAM: CHEST  2 VIEW COMPARISON:  12/15/2013 chest radiograph. FINDINGS: Stable cardiomediastinal silhouette with normal heart size. No pneumothorax. Trace bilateral pleural effusions. No pulmonary edema. There is new patchy consolidation in the peripheral mid to lower right lung with associated pleural thickening. IMPRESSION: 1. New patchy consolidation in the peripheral mid to lower right lung, which could represent a right middle lobe pneumonia in the correct clinical setting. Recommend follow-up PA and lateral post treatment chest radiographs in 4-6 weeks. 2. Trace bilateral pleural effusions. Electronically Signed   By: Ilona Sorrel M.D.   On: 06/13/2015 16:07  Ct Angio Chest Pe W/cm &/or Wo Cm  Result Date: 06/13/2015 CLINICAL DATA:  Cough weakness and upper back pain. EXAM: CT ANGIOGRAPHY CHEST WITH CONTRAST TECHNIQUE: Multidetector CT imaging of the chest was performed using the standard protocol during bolus administration of intravenous contrast. Multiplanar CT image reconstructions and MIPs were obtained to evaluate the vascular anatomy. CONTRAST:  80 cc Omnipaque 350 COMPARISON:  07/16/2013. FINDINGS: Mediastinum / Lymph Nodes: There is no axillary lymphadenopathy. No mediastinal lymphadenopathy. There is no hilar lymphadenopathy. The heart is at upper normal for size. No pericardial effusion. Coronary artery calcification is noted. The esophagus has normal imaging features.Acute pulmonary embolus is identified in lobar pulmonary arteries to the right middle and lower lobes. Segmental and subsegmental pulmonary embolus is noted in the lower lobes bilaterally. Lungs / Pleura: Wedge-shaped peripheral airspace disease in the right middle lobe is compatible with infarct. There is compressive atelectasis in the lower lobes bilaterally. Tiny right pleural effusion is evident. Upper Abdomen: Patient is status post laparoscopic banding  procedure. Otherwise unremarkable. MSK / Soft Tissues: Bone windows reveal no worrisome lytic or sclerotic osseous lesions. Review of the MIP images confirms the above findings. IMPRESSION: 1. Acute lobar pulmonary embolus to the right middle and lower lobes with segmental and subsegmental pulmonary embolus to the lower lobes bilaterally. Positive for acute PE with CT evidence of right heart strain (RV/LV Ratio = 1.1) consistent with at least submassive (intermediate risk) PE. The presence of right heart strain has been associated with an increased risk of morbidity and mortality. Please activate Code PE by paging [phone]. 2. Right middle lobe infarct. Critical Value/emergent results were called by me at the time of interpretation on 06/13/2015 at 6:49 pm to Dr. Blanchie Dessert , who verbally acknowledged these results. Electronically Signed   By: Misty Stanley M.D.   On: 06/13/2015 18:49    Assessment & Plan:   [place] was seen today for hypertension, hyperlipidemia and atrial fibrillation.  Diagnoses and all orders for this visit:  Essential hypertension- His blood pressure is adequately well controlled, electrolytes and renal function are stable. -     Basic metabolic panel; Future -     CBC with Differential/Platelet; Future  Hyperlipidemia with target LDL less than 130- he has achieved his LDL goal is doing well on the statin. -     Lipid panel; Future  Pure hyperglyceridemia- marked improvement noted, no medical therapy needed at this time -     Lipid panel; Future  Skin lesion of chest wall- I'm concerned this may be skin cancer and so referred him to dermatology. -     Ambulatory referral to Dermatology   I have discontinued Mr. Tempest multivitamin, loratadine, EQ ALLERGY RELIEF, and EQ ALLERGY RELIEF. I am also having him maintain his finasteride, Vitamin B-12, CALCIUM PO, DSS, aspirin, montelukast, rOPINIRole, flecainide, apixaban, furosemide, LORazepam, sertraline,  risperiDONE, atorvastatin, and pantoprazole.  No orders of the defined types were placed in this encounter.    Follow-up: Return in about 6 months (around 10/24/2016).  [name], MD

## 2016-04-28 ENCOUNTER — Encounter: Payer: Self-pay | Admitting: Internal Medicine

## 2016-04-28 ENCOUNTER — Ambulatory Visit (INDEPENDENT_AMBULATORY_CARE_PROVIDER_SITE_OTHER): Payer: Medicare Other | Admitting: Internal Medicine

## 2016-04-28 VITALS — BP 158/86 | HR 66 | Ht 72.0 in | Wt 350.6 lb

## 2016-04-28 DIAGNOSIS — I739 Peripheral vascular disease, unspecified: Secondary | ICD-10-CM | POA: Insufficient documentation

## 2016-04-28 DIAGNOSIS — R0602 Shortness of breath: Secondary | ICD-10-CM | POA: Diagnosis not present

## 2016-04-28 DIAGNOSIS — Z5181 Encounter for therapeutic drug level monitoring: Secondary | ICD-10-CM | POA: Diagnosis not present

## 2016-04-28 DIAGNOSIS — I480 Paroxysmal atrial fibrillation: Secondary | ICD-10-CM

## 2016-04-28 DIAGNOSIS — Z79899 Other long term (current) drug therapy: Secondary | ICD-10-CM

## 2016-04-28 NOTE — Progress Notes (Signed)
OFFICE NOTE  Chief Complaint:  Shortness of breath with exercise, leg pain  Primary Care Physician: [name], MD  HPI:  [name] is a 71 y/o male with a history of bariatric surgery, one year ago, lost about 85 pounds before and after surgery. Also has a history of hypertension and sleep apnea. He has been admitted twice in the last month with an unusual presentation with multiple medical problems, including pulmonary embolus, moderate to large pericardial effusion, new onset a-fib and GI bleed. His PE was diagnosed by his PCP through outpatient w/u and he was placed on Xarelto. His first hospital admission was from 4/15-4/24. At that time his CC was chest pain. W/u suggested a diagnosis of pericarditis. An Echocardiogram was done, which showed moderate to large pericardial effusion. Meanwhile the night of 4/16 he went in to afib with RVR, probably secondary to PE and was transferred to step down and started on cardizem drip. He converted to sinus the same night , cardizem discontinued and he was resumed on metoprolol. CT surgery was consulted for pericardial window. He underwent pericardial window on 4/17 and hemorrhagic fluid was drained and was sent for analysis. Gram stain and cultures were negative. He was also started on colchicine for his pericarditis. The evening of 4/20 pt went back in to afib with RVR and had bloody bowel movement. His xarelto was discontinued and he was put on cardizem gtt. Over the next 24 hours his hemoglobin dropped to 7.8 and he received 2 units of prbc transfusion. His repeat H&H improved. Dr Henrene Pastor was consulted from GI on 4/20 and he underwent endoscopy on 4/21 . He was found to have superficial ulcers in the stomach, underwent biopsy of the area. He was recommended to continue on PPI twice daily for 2 weeks and resume xarelto. He was discharged home then returned 3 days later on 07/28/13 with a complaint of increased fatigue and SOB. Repeat 2D echo  demonstrated only minimal residual effusion with normal RV and LV function. 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 He was admitted and underwent a repeat EGD which showed healing of ulcers, no bleed. He was continued on PO iron and a PPI. His Hgb at time of discharge was stable at 9.0. He was discharged home. Cardiology had recommended an OP stress test. This was done 08/03/13 and showed normal myocardial perfusion and normal LV EF of 58%.  He returns today and has reported a marked improvement in his shortness of breath. He is able to do most activities without any difficulty. He continues on Eliquis for his pulmonary embolus and for stroke prevention. He is on flecainide for antiarrhythmic therapy. He denies any chest pain. His only complaint is fatigue. He reports some fatigue and notices it worse as he has been working on refurbishing a house that his mother used to own to sell. He reports his sleep is good at night however he does have difficulty falling asleep which may be playing a role in his fatigue.  [name] returns today for follow-up. He reports no significant difference in his symptoms. He gets some mild shortness of breath with exercise which she does very infrequently. He also reports leg swelling which is persistent. Weight has been fairly stable although could stand to go down. We talked about ways he could work on weight loss. He does have a history of lap band in the past however he is not been able to lose anymore  weight and in fact has gained it back. He denies any recurrent palpitation or A. Fib that he is aware of. He continues on flecainide which he is tolerating without any difficulty.  04/28/2016  [name] was seen back today in follow-up. He continues to complain of shortness of breath, persistent with exertion. Weight is now up from 337-350 pounds. He is celebrating the birth of this newest grandchild and wants to be more active and is frustrated that he is not  able to do much activity without shortness of breath. He is on flecainide without any recurrent atrial fibrillation. His last stress test was more than 2 years ago and he is due for repeat stress test.  PMHx:  Past Medical History:  Diagnosis Date  . Anemia 07/29/2013  . Asthma    childhood asthma  . Benign prostatic hypertrophy    several prostate biopsies neg for cancer.   . Bilateral hip pain   . Cancer ([place])   . CHF (congestive heart failure) ([place])   . Complication of anesthesia    MORPHINE CAUSES HALLUCINATIONS  . Depression   . FH: colonic polyps 2010  . Gout   . History of kidney stones   . Hypertension   . Kidney cysts   . Morbid obesity ([place])   . Numbness    feet / legs  . PAF (paroxysmal atrial fibrillation) with RVR 07/29/2013  . Sleep apnea    USES C PAP    Past Surgical History:  Procedure Laterality Date  . ARTHROSCOPY KNEE W/ DRILLING    . BREATH TEK H PYLORI N/A 12/23/2012   Procedure: BREATH TEK H PYLORI;  Surgeon: Gayland Curry, MD;  Location: [facility];  Service: General;  Laterality: N/A;  . CHOLECYSTECTOMY  1989  . COLONOSCOPY N/A 07/31/2013   Procedure: COLONOSCOPY;  Surgeon: Gatha Mayer, MD;  Location: [place];  Service: Endoscopy;  Laterality: N/A;  . ESOPHAGOGASTRODUODENOSCOPY N/A 07/22/2013   Procedure: ESOPHAGOGASTRODUODENOSCOPY (EGD);  Surgeon: Irene Shipper, MD;  Location: [facility];  Service: Endoscopy;  Laterality: N/A;  . ESOPHAGOGASTRODUODENOSCOPY N/A 07/31/2013   Procedure: ESOPHAGOGASTRODUODENOSCOPY (EGD);  Surgeon: Gatha Mayer, MD;  Location: [facility] END[facility]COPY;  Service: Endoscopy;  Laterality: N/A;  . INTRAOPERATIVE TRANSESOPHAGEAL ECHOCARDIOGRAM N/A 07/18/2013   Procedure: INTRAOPERATIVE TRANSESOPHAGEAL ECHOCARDIOGRAM;  Surgeon: Grace Isaac, MD;  Location: [place];  Service: Open Heart Surgery;  Laterality: N/A;  . LAPAROSCOPIC GASTRIC BANDING WITH HIATAL HERNIA REPAIR N/A 04/08/2013   Procedure: LAPAROSCOPIC GASTRIC BANDING WITH  possible  HIATAL HERNIA REPAIR;  Surgeon: Gayland Curry, MD;  Location: [facility];  Service: General;  Laterality: N/A;  . LITHOTRIPSY    . renal calculi    . SUBXYPHOID PERICARDIAL WINDOW N/A 07/18/2013   Procedure: SUBXYPHOID PERICARDIAL WINDOW;  Surgeon: Grace Isaac, MD;  Location: [place];  Service: Thoracic;  Laterality: N/A;  . TONSILLECTOMY    . total knee replacment     bilat  . trigger finger repair     also lue, cts surgically repaired  . WISDOM TOOTH EXTRACTION      FAMHx:  Family History  Problem Relation Age of Onset  . Depression Mother   . Hypertension Mother   . Heart disease Father     MI  . Depression Brother   . Stroke Paternal Aunt     CVA  . Diabetes Paternal Uncle   . Heart disease Paternal Uncle     MI  . Heart disease Paternal Grandmother  MI  . Diabetes Cousin     PATERNAL    SOCHx:   reports that he quit smoking about 41 years ago. His smoking use included Cigarettes. He started smoking about 55 years ago. He has a 2.60 pack-year smoking history. He has never used smokeless tobacco. He reports that he drinks alcohol. He reports that he does not use drugs.  ALLERGIES:  Allergies  Allergen Reactions  . Morphine Other (See Comments)    Nausea and Severe hallucinations  . Codeine Nausea Only  . Penicillins Other (See Comments)    LOC with shot  . Propoxyphene N-Acetaminophen Nausea Only    ROS: Pertinent items noted in HPI and remainder of comprehensive ROS otherwise negative.  HOME MEDS: Current Outpatient Prescriptions  Medication Sig Dispense Refill  . apixaban (ELIQUIS) 5 MG TABS tablet Take 1 tablet (5 mg total) by mouth 2 (two) times daily. 180 tablet 3  . aspirin 81 MG tablet Take 81 mg by mouth 2 (two) times daily.     [name] atorvastatin (LIPITOR) 40 MG tablet Take 1 tablet (40 mg total) by mouth daily. 90 tablet 0  . CALCIUM PO Take 500 mg by mouth 3 (three) times daily.     . Cyanocobalamin (VITAMIN B-12) 1000 MCG SUBL Place 1,000  mcg under the tongue every evening.     [name] Sodium (DSS) 100 MG CAPS Take 100 mg by mouth every other day.    . finasteride (PROSCAR) 5 MG tablet Take 5 mg by mouth daily.     . flecainide (TAMBOCOR) 50 MG tablet TAKE ONE TABLET BY MOUTH EVERY 12 HOURS 180 tablet 2  . furosemide (LASIX) 20 MG tablet TAKE 1 TAB ONCE A DAY AS NEEDED FOR SWELLING IN THE LEGS 90 tablet 1  . loratadine (CLARITIN) 10 MG tablet Take 10 mg by mouth daily.    [name] LORazepam (ATIVAN) 0.5 MG tablet TAKE ONE TABLET BY MOUTH ONCE DAILY IN THE MORNING AND TWO AT BEDTIME 90 tablet 2  . montelukast (SINGULAIR) 10 MG tablet TAKE ONE TABLET BY MOUTH AT BEDTIME 30 tablet 11  . pantoprazole (PROTONIX) 40 MG tablet Take 1 tablet (40 mg total) by mouth 2 (two) times daily. 180 tablet 0  . risperiDONE (RISPERDAL) 0.5 MG tablet Take 1 tablet (0.5 mg total) by mouth at bedtime. 30 tablet 2  . rOPINIRole (REQUIP) 0.25 MG tablet TAKE THREE TABLETS BY MOUTH AT BEDTIME OR  AS  DIRECTED  FOR  LEG  JERKS 90 tablet 11  . sertraline (ZOLOFT) 100 MG tablet TAKE ONE AND ONE-HALF TABLETS BY MOUTH ONCE DAILY WITH  BREAKFAST 45 tablet 2   No current facility-administered medications for this visit.     LABS/IMAGING: No results found for this or any previous visit (from the past 48 hour(s)). No results found.  VITALS: BP (!) 158/86   Pulse 66   Ht 6' (1.829 m)   Wt (!) 350 lb 9.6 oz (159 kg)   BMI 47.55 kg/m   EXAM: General appearance: alert and no distress Neck: no carotid bruit and no JVD Lungs: clear to auscultation bilaterally Heart: regular rate and rhythm, S1, S2 normal, no murmur, click, rub or gallop Abdomen: soft, non-tender; bowel sounds normal; no masses,  no organomegaly and obese Extremities: extremities normal, atraumatic, no cyanosis or edema Pulses: 2+ and symmetric Skin: Skin color, texture, turgor normal. No rashes or lesions Neurologic: Grossly normal Psych: Normal  EKG: Normal sinus rhythm at 66,  nonspecific ST changes  ASSESSMENT: 1. Paroxysmal atrial fibrillation on flecainide 2. Hyperlipidemia 3. Hypertension 4. Recent history of pericarditis/pericardial effusion status post pericardial window 5. Pulmonary embolus 6. Morbid obesity 7. Obstructive sleep apnea on CPAP  PLAN: 1.   [name] continues to have some persistent shortness of breath. I suspect this is mostly due to significant weight. BMI is now almost 48. He does not seem to have benefited from LAP-BAND surgery although may need readjustments and dietary interventions. 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 It does not appear these had any recurrent A. fib on flecainide. He is due for repeat routine testing as well as for further evaluation of progressive shortness of breath. I recommend a 2 day Lexiscan Myoview to look for coronary ischemia. Follow-up with me afterwards. Finally, he is complaining of some leg pain bilaterally. He gets some 5 pain particular just standing for periods of time he's had to sit down or gets quite worn out. He also gets numbness and tingling in his legs and feels coolness in his extremities. This could be due to neuropathy and/or perhaps peripheral arterial disease. I recommend lower chimney arterial Dopplers to rule out the latter.  Pixie Casino, MD, [facility] Attending Cardiologist [place] 04/28/2016, 3:35 PM

## 2016-04-28 NOTE — Patient Instructions (Signed)
Your physician has requested that you have a lower extremity arterial duplex. This test is an ultrasound of the arteries in the legs. It looks at arterial blood flow in the legs. Allow one hour for Lower Arterial scans. There are no restrictions or special instructions  Your physician has requested that you have a 2 day - [place]. For further information please visit [email or web address]. Please follow instruction sheet, as given.  Your physician recommends that you schedule a follow-up appointment after your testing.

## 2016-05-11 ENCOUNTER — Telehealth (HOSPITAL_COMMUNITY): Payer: Self-pay

## 2016-05-11 NOTE — Telephone Encounter (Signed)
Encounter complete. 

## 2016-05-16 ENCOUNTER — Ambulatory Visit (HOSPITAL_COMMUNITY)
Admission: RE | Admit: 2016-05-16 | Discharge: 2016-05-16 | Disposition: A | Discharge status: Home / Self Care | Payer: Medicare Other | Source: Ambulatory Visit | Attending: Cardiovascular Disease | Admitting: Cardiovascular Disease

## 2016-05-16 DIAGNOSIS — Z5181 Encounter for therapeutic drug level monitoring: Secondary | ICD-10-CM | POA: Diagnosis not present

## 2016-05-16 DIAGNOSIS — Z79899 Other long term (current) drug therapy: Secondary | ICD-10-CM

## 2016-05-16 DIAGNOSIS — R0602 Shortness of breath: Secondary | ICD-10-CM | POA: Insufficient documentation

## 2016-05-16 MED ORDER — REGADENOSON 0.4 MG/5ML IV SOLN
0.4000 mg | Freq: Once | INTRAVENOUS | Status: AC
  Administered 2016-05-16: 0.4 mg via INTRAVENOUS

## 2016-05-16 MED ORDER — TECHNETIUM TC 99M TETROFOSMIN IV KIT
29.4000 | PACK | Freq: Once | INTRAVENOUS | Status: AC | PRN
  Administered 2016-05-16: 29.4 via INTRAVENOUS
  Filled 2016-05-16: qty 30

## 2016-05-17 ENCOUNTER — Other Ambulatory Visit: Payer: Self-pay | Admitting: Internal Medicine

## 2016-05-17 ENCOUNTER — Ambulatory Visit (HOSPITAL_COMMUNITY)
Admission: RE | Admit: 2016-05-17 | Discharge: 2016-05-17 | Disposition: A | Discharge status: Home / Self Care | Payer: Medicare Other | Source: Ambulatory Visit | Attending: Diagnostic Radiology | Admitting: Diagnostic Radiology

## 2016-05-17 DIAGNOSIS — I739 Peripheral vascular disease, unspecified: Secondary | ICD-10-CM

## 2016-05-17 LAB — MYOCARDIAL PERFUSION IMAGING
CHL CUP NUCLEAR SDS: 0
CHL CUP NUCLEAR SRS: 0
CHL CUP RESTING HR STRESS: 55 {beats}/min
LV dias vol: 142 mL (ref 62–150)
LVSYSVOL: 63 mL
NUC STRESS TID: 0.89
Peak HR: 69 {beats}/min
SSS: 0

## 2016-05-17 MED ORDER — TECHNETIUM TC 99M TETROFOSMIN IV KIT
29.3000 | PACK | Freq: Once | INTRAVENOUS | Status: AC | PRN
  Administered 2016-05-17: 29.3 via INTRAVENOUS

## 2016-05-21 ENCOUNTER — Other Ambulatory Visit (HOSPITAL_COMMUNITY): Payer: Self-pay | Admitting: Psychiatry

## 2016-05-21 DIAGNOSIS — F330 Major depressive disorder, recurrent, mild: Secondary | ICD-10-CM

## 2016-05-23 ENCOUNTER — Encounter: Payer: Self-pay | Admitting: Internal Medicine

## 2016-05-23 ENCOUNTER — Ambulatory Visit (INDEPENDENT_AMBULATORY_CARE_PROVIDER_SITE_OTHER): Payer: Medicare Other | Admitting: Internal Medicine

## 2016-05-23 VITALS — BP 168/89 | HR 60 | Ht 72.0 in | Wt 353.4 lb

## 2016-05-23 DIAGNOSIS — I10 Essential (primary) hypertension: Secondary | ICD-10-CM

## 2016-05-23 DIAGNOSIS — R0602 Shortness of breath: Secondary | ICD-10-CM

## 2016-05-23 DIAGNOSIS — I480 Paroxysmal atrial fibrillation: Secondary | ICD-10-CM | POA: Diagnosis not present

## 2016-05-23 MED ORDER — AMLODIPINE BESY-BENAZEPRIL HCL 5-10 MG PO CAPS: 1.0000 | ORAL_CAPSULE | Freq: Every day | ORAL | 5 refills | Status: DC

## 2016-05-23 NOTE — Progress Notes (Signed)
OFFICE NOTE  Chief Complaint:  No complaints  Primary Care Physician: [name], MD  HPI:  [name] is a 71 y/o male with a history of bariatric surgery, one year ago, lost about 85 pounds before and after surgery. Also has a history of hypertension and sleep apnea. He has been admitted twice in the last month with an unusual presentation with multiple medical problems, including pulmonary embolus, moderate to large pericardial effusion, new onset a-fib and GI bleed. His PE was diagnosed by his PCP through outpatient w/u and he was placed on Xarelto. His first hospital admission was from 4/15-4/24. At that time his CC was chest pain. W/u suggested a diagnosis of pericarditis. An Echocardiogram was done, which showed moderate to large pericardial effusion. Meanwhile the night of 4/16 he went in to afib with RVR, probably secondary to PE and was transferred to step down and started on cardizem drip. He converted to sinus the same night , cardizem discontinued and he was resumed on metoprolol. CT surgery was consulted for pericardial window. He underwent pericardial window on 4/17 and hemorrhagic fluid was drained and was sent for analysis. Gram stain and cultures were negative. He was also started on colchicine for his pericarditis. The evening of 4/20 pt went back in to afib with RVR and had bloody bowel movement. His xarelto was discontinued and he was put on cardizem gtt. Over the next 24 hours his hemoglobin dropped to 7.8 and he received 2 units of prbc transfusion. His repeat H&H improved. Dr Henrene Pastor was consulted from GI on 4/20 and he underwent endoscopy on 4/21 . He was found to have superficial ulcers in the stomach, underwent biopsy of the area. He was recommended to continue on PPI twice daily for 2 weeks and resume xarelto. He was discharged home then returned 3 days later on 07/28/13 with a complaint of increased fatigue and SOB. Repeat 2D echo demonstrated only minimal residual  effusion with normal RV and LV function. 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 He was admitted and underwent a repeat EGD which showed healing of ulcers, no bleed. He was continued on PO iron and a PPI. His Hgb at time of discharge was stable at 9.0. He was discharged home. Cardiology had recommended an OP stress test. This was done 08/03/13 and showed normal myocardial perfusion and normal LV EF of 58%.  He returns today and has reported a marked improvement in his shortness of breath. He is able to do most activities without any difficulty. He continues on Eliquis for his pulmonary embolus and for stroke prevention. He is on flecainide for antiarrhythmic therapy. He denies any chest pain. His only complaint is fatigue. He reports some fatigue and notices it worse as he has been working on refurbishing a house that his mother used to own to sell. He reports his sleep is good at night however he does have difficulty falling asleep which may be playing a role in his fatigue.  [name] returns today for follow-up. He reports no significant difference in his symptoms. He gets some mild shortness of breath with exercise which she does very infrequently. He also reports leg swelling which is persistent. Weight has been fairly stable although could stand to go down. We talked about ways he could work on weight loss. He does have a history of lap band in the past however he is not been able to lose anymore weight and in fact has  gained it back. He denies any recurrent palpitation or A. Fib that he is aware of. He continues on flecainide which he is tolerating without any difficulty.  04/28/2016  [name] was seen back today in follow-up. He continues to complain of shortness of breath, persistent with exertion. Weight is now up from 337-350 pounds. He is celebrating the birth of this newest grandchild and wants to be more active and is frustrated that he is not able to do much activity without  shortness of breath. He is on flecainide without any recurrent atrial fibrillation. His last stress test was more than 2 years ago and he is due for repeat stress test.  05/23/2016  [name] returns today for follow-up. He is unfortunately gained another few pounds although he felt that this is related to stuff in his pockets. His stress test was negative for ischemia. It is noted that his blood pressure is high again today 168/89. He was previously elevated over 150. He does have a history of hypertension and was on Lotrel in the past but was taken off of that due to hypotension after significant weight loss that occurred after lap band surgery. Unfortunately he has gained back most of that weight and likely is hypertensive secondary to that.  PMHx:  Past Medical History:  Diagnosis Date  . Anemia 07/29/2013  . Asthma    childhood asthma  . Benign prostatic hypertrophy    several prostate biopsies neg for cancer.   . Bilateral hip pain   . Cancer ([place])   . CHF (congestive heart failure) ([place])   . Complication of anesthesia    MORPHINE CAUSES HALLUCINATIONS  . Depression   . FH: colonic polyps 2010  . Gout   . History of kidney stones   . Hypertension   . Kidney cysts   . Morbid obesity ([place])   . Numbness    feet / legs  . PAF (paroxysmal atrial fibrillation) with RVR 07/29/2013  . Sleep apnea    USES C PAP    Past Surgical History:  Procedure Laterality Date  . ARTHROSCOPY KNEE W/ DRILLING    . BREATH TEK H PYLORI N/A 12/23/2012   Procedure: BREATH TEK H PYLORI;  Surgeon: Gayland Curry, MD;  Location: [facility];  Service: General;  Laterality: N/A;  . CHOLECYSTECTOMY  1989  . COLONOSCOPY N/A 07/31/2013   Procedure: COLONOSCOPY;  Surgeon: Gatha Mayer, MD;  Location: [place];  Service: Endoscopy;  Laterality: N/A;  . ESOPHAGOGASTRODUODENOSCOPY N/A 07/22/2013   Procedure: ESOPHAGOGASTRODUODENOSCOPY (EGD);  Surgeon: Irene Shipper, MD;  Location: [facility] END[facility]COPY;  Service:  Endoscopy;  Laterality: N/A;  . ESOPHAGOGASTRODUODENOSCOPY N/A 07/31/2013   Procedure: ESOPHAGOGASTRODUODENOSCOPY (EGD);  Surgeon: Gatha Mayer, MD;  Location: [facility]COPY;  Service: Endoscopy;  Laterality: N/A;  . INTRAOPERATIVE TRANSESOPHAGEAL ECHOCARDIOGRAM N/A 07/18/2013   Procedure: INTRAOPERATIVE TRANSESOPHAGEAL ECHOCARDIOGRAM;  Surgeon: Grace Isaac, MD;  Location: [place];  Service: Open Heart Surgery;  Laterality: N/A;  . LAPAROSCOPIC GASTRIC BANDING WITH HIATAL HERNIA REPAIR N/A 04/08/2013   Procedure: LAPAROSCOPIC GASTRIC BANDING WITH possible  HIATAL HERNIA REPAIR;  Surgeon: Gayland Curry, MD;  Location: [facility];  Service: General;  Laterality: N/A;  . LITHOTRIPSY    . renal calculi    . SUBXYPHOID PERICARDIAL WINDOW N/A 07/18/2013   Procedure: SUBXYPHOID PERICARDIAL WINDOW;  Surgeon: Grace Isaac, MD;  Location: [place];  Service: Thoracic;  Laterality: N/A;  . TONSILLECTOMY    . total knee replacment  bilat  . trigger finger repair     also lue, cts surgically repaired  . WISDOM TOOTH EXTRACTION      FAMHx:  Family History  Problem Relation Age of Onset  . Depression Mother   . Hypertension Mother   . Heart disease Father     MI  . Depression Brother   . Stroke Paternal Aunt     CVA  . Diabetes Paternal Uncle   . Heart disease Paternal Uncle     MI  . Heart disease Paternal Grandmother     MI  . Diabetes Cousin     PATERNAL    SOCHx:   reports that he quit smoking about 41 years ago. His smoking use included Cigarettes. He started smoking about 55 years ago. He has a 2.60 pack-year smoking history. He has never used smokeless tobacco. He reports that he drinks alcohol. He reports that he does not use drugs.  ALLERGIES:  Allergies  Allergen Reactions  . Morphine Other (See Comments)    Nausea and Severe hallucinations  . Codeine Nausea Only  . Penicillins Other (See Comments)    LOC with shot  . Propoxyphene N-Acetaminophen Nausea Only     ROS: Pertinent items noted in HPI and remainder of comprehensive ROS otherwise negative.  HOME MEDS: Current Outpatient Prescriptions  Medication Sig Dispense Refill  . apixaban (ELIQUIS) 5 MG TABS tablet Take 1 tablet (5 mg total) by mouth 2 (two) times daily. 180 tablet 3  . aspirin 81 MG tablet Take 81 mg by mouth 2 (two) times daily.     [name] atorvastatin (LIPITOR) 40 MG tablet Take 1 tablet (40 mg total) by mouth daily. 90 tablet 0  . CALCIUM PO Take 500 mg by mouth 3 (three) times daily.     . Cyanocobalamin (VITAMIN B-12) 1000 MCG SUBL Place 1,000 mcg under the tongue every evening.     [name] Sodium (DSS) 100 MG CAPS Take 100 mg by mouth every other day.    . finasteride (PROSCAR) 5 MG tablet Take 5 mg by mouth daily.     . flecainide (TAMBOCOR) 50 MG tablet TAKE ONE TABLET BY MOUTH EVERY 12 HOURS 180 tablet 2  . furosemide (LASIX) 20 MG tablet TAKE 1 TAB ONCE A DAY AS NEEDED FOR SWELLING IN THE LEGS 90 tablet 1  . loratadine (CLARITIN) 10 MG tablet Take 10 mg by mouth daily.    [name] LORazepam (ATIVAN) 0.5 MG tablet TAKE ONE TABLET BY MOUTH ONCE DAILY IN THE MORNING AND TWO AT BEDTIME 90 tablet 2  . montelukast (SINGULAIR) 10 MG tablet TAKE ONE TABLET BY MOUTH AT BEDTIME 30 tablet 11  . pantoprazole (PROTONIX) 40 MG tablet Take 1 tablet (40 mg total) by mouth 2 (two) times daily. 180 tablet 0  . risperiDONE (RISPERDAL) 0.5 MG tablet Take 1 tablet (0.5 mg total) by mouth at bedtime. 30 tablet 2  . rOPINIRole (REQUIP) 0.25 MG tablet TAKE THREE TABLETS BY MOUTH AT BEDTIME OR  AS  DIRECTED  FOR  LEG  JERKS 90 tablet 11  . sertraline (ZOLOFT) 100 MG tablet TAKE ONE AND ONE-HALF TABLETS BY MOUTH ONCE DAILY WITH  BREAKFAST 45 tablet 2  . amLODipine-benazepril (LOTREL) 5-10 MG capsule Take 1 capsule by mouth daily. 30 capsule 5   No current facility-administered medications for this visit.     LABS/IMAGING: No results found for this or any previous visit (from the past 48  hour(s)). No results found.  VITALS:  BP (!) 168/89   Pulse 60   Ht 6' (1.829 m)   Wt (!) 353 lb 6.4 oz (160.3 kg)   SpO2 95%   BMI 47.93 kg/m   EXAM: Deferred  EKG: Deferred  ASSESSMENT: 1. Paroxysmal atrial fibrillation on flecainide 2. Hyperlipidemia 3. Hypertension 4. Recent history of pericarditis/pericardial effusion status post pericardial window 5. Pulmonary embolus 6. Morbid obesity 7. Obstructive sleep apnea on CPAP  PLAN: 1.   [name] at a negative nuclear stress test and I suspect his shortness of breath is likely related to significant weight gain. Unfortunately this is cause recurrent hypertension. In the past and been on Lotrel and had tried several other blood pressure medications which she was intolerant of. He is agreeable to going back on that medication which I think would help his symptoms as well. Plan to restart Lotrel 5/10 mg and follow-up in the hypertension clinic in a few weeks.  Pixie Casino, MD, [facility] Attending Cardiologist [place] 05/23/2016, 5:10 PM

## 2016-05-23 NOTE — Patient Instructions (Addendum)
Your physician has recommended you make the following change in your medication:  -- START Lotrel daily (amlodipine-benazepril 5-10mg )  Your physician recommends that you schedule a follow-up appointment in: 2-3 weeks with clinical pharmacy staff for a blood pressure check appointment  If you monitor your BP at home, please bring a list of readings and your BP cuff with you to this appointment   Your physician wants you to follow-up in: [place] with Dr. Debara Pickett. You will receive a reminder letter in the mail two months in advance. If you don't receive a letter, please call our office to schedule the follow-up appointment.

## 2016-05-24 ENCOUNTER — Ambulatory Visit (HOSPITAL_COMMUNITY)
Admission: RE | Admit: 2016-05-24 | Discharge: 2016-05-24 | Disposition: A | Discharge status: Home / Self Care | Payer: Medicare Other | Source: Ambulatory Visit | Attending: Cardiology | Admitting: Cardiology

## 2016-05-24 DIAGNOSIS — I739 Peripheral vascular disease, unspecified: Secondary | ICD-10-CM | POA: Diagnosis not present

## 2016-05-25 ENCOUNTER — Other Ambulatory Visit (HOSPITAL_COMMUNITY): Payer: Self-pay

## 2016-05-25 DIAGNOSIS — F330 Major depressive disorder, recurrent, mild: Secondary | ICD-10-CM

## 2016-05-25 MED ORDER — LORAZEPAM 0.5 MG PO TABS: ORAL_TABLET | ORAL | 0 refills | Status: DC

## 2016-05-30 ENCOUNTER — Encounter (HOSPITAL_COMMUNITY): Payer: Self-pay | Admitting: Psychiatry

## 2016-05-30 ENCOUNTER — Ambulatory Visit (INDEPENDENT_AMBULATORY_CARE_PROVIDER_SITE_OTHER): Payer: Medicare Other | Admitting: Psychiatry

## 2016-05-30 DIAGNOSIS — Z87891 Personal history of nicotine dependence: Secondary | ICD-10-CM | POA: Diagnosis not present

## 2016-05-30 DIAGNOSIS — Z888 Allergy status to other drugs, medicaments and biological substances status: Secondary | ICD-10-CM | POA: Diagnosis not present

## 2016-05-30 DIAGNOSIS — Z818 Family history of other mental and behavioral disorders: Secondary | ICD-10-CM | POA: Diagnosis not present

## 2016-05-30 DIAGNOSIS — Z7982 Long term (current) use of aspirin: Secondary | ICD-10-CM

## 2016-05-30 DIAGNOSIS — Z79899 Other long term (current) drug therapy: Secondary | ICD-10-CM

## 2016-05-30 DIAGNOSIS — F330 Major depressive disorder, recurrent, mild: Secondary | ICD-10-CM | POA: Diagnosis not present

## 2016-05-30 DIAGNOSIS — Z880 Allergy status to penicillin: Secondary | ICD-10-CM

## 2016-05-30 MED ORDER — RISPERIDONE 0.5 MG PO TABS: 0.5000 mg | ORAL_TABLET | Freq: Every day | ORAL | 2 refills | Status: DC

## 2016-05-30 MED ORDER — LORAZEPAM 0.5 MG PO TABS: ORAL_TABLET | ORAL | 2 refills | Status: DC

## 2016-05-30 MED ORDER — SERTRALINE HCL 100 MG PO TABS: ORAL_TABLET | ORAL | 2 refills | Status: DC

## 2016-05-30 NOTE — Progress Notes (Signed)
[facility] MD/PA/NP OP Progress Note  05/30/2016 1:25 PM [name]  MRN:  [id number]  Chief Complaint:  Subjective:  I'm very happy I have a granddaughter in December.  We had a very good Christmas.  HPI: [name] came for his follow-up appointment.  He is taking his medication denies any side effects.  He is very happy because he had a granddaughter in December.  He had a good Christmas.  He noticed more relaxed and calm.  Unfortunately he had gained weight and recently he was prescribed blood pressure medication.  He sleeping good.  He denies any irritability, anger, mania or any feeling of hopelessness or worthlessness.  He uses his CPAP machine.  He wants to continue his current psychiatric medication which is helping him.  Patient denies drinking alcohol or using any illegal substances.  He lives with his wife who is supportive.  His energy level is fair.  Visit Diagnosis:    ICD-9-CM ICD-10-CM   1. Major depressive disorder, recurrent episode, mild (HCC) 296.31 F33.0 sertraline (ZOLOFT) 100 MG tablet     risperiDONE (RISPERDAL) 0.5 MG tablet     LORazepam (ATIVAN) 0.5 MG tablet    Past Psychiatric History: Reviewed. Patient has one psychiatric admission in 2006 due to increased depression and having suicidal thoughts.  Since then patient has been seen in this office and stable on his psychiatric medication.  Past Medical History:  Past Medical History:  Diagnosis Date  . Anemia 07/29/2013  . Asthma    childhood asthma  . Benign prostatic hypertrophy    several prostate biopsies neg for cancer.   . Bilateral hip pain   . Cancer ([place])   . CHF (congestive heart failure) ([place])   . Complication of anesthesia    MORPHINE CAUSES HALLUCINATIONS  . Depression   . FH: colonic polyps 2010  . Gout   . History of kidney stones   . Hypertension   . Kidney cysts   . Morbid obesity ([place])   . Numbness    feet / legs  . PAF (paroxysmal atrial fibrillation) with RVR 07/29/2013  . Sleep apnea     USES C PAP    Past Surgical History:  Procedure Laterality Date  . ARTHROSCOPY KNEE W/ DRILLING    . BREATH TEK H PYLORI N/A 12/23/2012   Procedure: BREATH TEK H PYLORI;  Surgeon: Gayland Curry, MD;  Location: [facility];  Service: General;  Laterality: N/A;  . CHOLECYSTECTOMY  1989  . COLONOSCOPY N/A 07/31/2013   Procedure: COLONOSCOPY;  Surgeon: Gatha Mayer, MD;  Location: [place];  Service: Endoscopy;  Laterality: N/A;  . ESOPHAGOGASTRODUODENOSCOPY N/A 07/22/2013   Procedure: ESOPHAGOGASTRODUODENOSCOPY (EGD);  Surgeon: Irene Shipper, MD;  Location: [facility] ENDOSCOPY;  Service: Endoscopy;  Laterality: N/A;  . ESOPHAGOGASTRODUODENOSCOPY N/A 07/31/2013   Procedure: ESOPHAGOGASTRODUODENOSCOPY (EGD);  Surgeon: Gatha Mayer, MD;  Location: [facility] ENDOSCOPY;  Service: Endoscopy;  Laterality: N/A;  . INTRAOPERATIVE TRANSESOPHAGEAL ECHOCARDIOGRAM N/A 07/18/2013   Procedure: INTRAOPERATIVE TRANSESOPHAGEAL ECHOCARDIOGRAM;  Surgeon: Grace Isaac, MD;  Location: [place];  Service: Open Heart Surgery;  Laterality: N/A;  . LAPAROSCOPIC GASTRIC BANDING WITH HIATAL HERNIA REPAIR N/A 04/08/2013   Procedure: LAPAROSCOPIC GASTRIC BANDING WITH possible  HIATAL HERNIA REPAIR;  Surgeon: Gayland Curry, MD;  Location: [facility];  Service: General;  Laterality: N/A;  . LITHOTRIPSY    . renal calculi    . SUBXYPHOID PERICARDIAL WINDOW N/A 07/18/2013   Procedure: SUBXYPHOID PERICARDIAL WINDOW;  Surgeon: Lilia Argue  Servando Snare, MD;  Location: [place];  Service: Thoracic;  Laterality: N/A;  . TONSILLECTOMY    . total knee replacment     bilat  . trigger finger repair     also lue, cts surgically repaired  . WISDOM TOOTH EXTRACTION      Family Psychiatric History: Reviewed.  Family History:  Family History  Problem Relation Age of Onset  . Depression Mother   . Hypertension Mother   . Heart disease Father     MI  . Depression Brother   . Stroke Paternal Aunt     CVA  . Diabetes Paternal Uncle   . Heart disease  Paternal Uncle     MI  . Heart disease Paternal Grandmother     MI  . Diabetes Cousin     PATERNAL    Social History:  Social History   Social History  . Marital status: Married    Spouse name: N/A  . Number of children: 1  . Years of education: N/A   Occupational History  . retired-Manager [employer]    Social History Main Topics  . Smoking status: Former Smoker    Packs/day: 0.20    Years: 13.00    Types: Cigarettes    Start date: 04/03/1961    Quit date: 08/30/1974  . Smokeless tobacco: Never Used  . Alcohol use 0.0 oz/week     Comment: Occasionally; 1 small drink a week  . Drug use: No  . Sexual activity: Not Currently   Other Topics Concern  . None   Social History Narrative  . None    Allergies:  Allergies  Allergen Reactions  . Morphine Other (See Comments)    Nausea and Severe hallucinations  . Codeine Nausea Only  . Penicillins Other (See Comments)    LOC with shot  . Propoxyphene N-Acetaminophen Nausea Only    Metabolic Disorder Labs: Recent Results (from the past 2160 hour(s))  Basic metabolic panel     Status: Abnormal   Collection Time: 04/26/16  1:33 PM  Result Value Ref Range   Sodium 144 135 - 145 mEq/L   Potassium 4.2 3.5 - 5.1 mEq/L   Chloride 106 96 - 112 mEq/L   CO2 31 19 - 32 mEq/L   Glucose, Bld 101 (H) 70 - 99 mg/dL   BUN 22 6 - 23 mg/dL   Creatinine, Ser 1.20 0.40 - 1.50 mg/dL   Calcium 8.9 8.4 - 10.5 mg/dL   GFR 63.55 >60.00 mL/min  CBC with Differential/Platelet     Status: None   Collection Time: 04/26/16  1:33 PM  Result Value Ref Range   WBC 8.1 4.0 - 10.5 K/uL   RBC 4.96 4.22 - 5.81 Mil/uL   Hemoglobin 14.4 13.0 - 17.0 g/dL   HCT 42.4 39.0 - 52.0 %   MCV 85.4 78.0 - 100.0 fl   MCHC 34.0 30.0 - 36.0 g/dL   RDW 14.2 11.5 - 15.5 %   Platelets 210.0 150.0 - 400.0 K/uL   Neutrophils Relative % 69.1 43.0 - 77.0 %   Lymphocytes Relative 18.7 12.0 - 46.0 %   Monocytes Relative 9.3 3.0 - 12.0 %   Eosinophils Relative 2.1  0.0 - 5.0 %   Basophils Relative 0.8 0.0 - 3.0 %   Neutro Abs 5.6 1.4 - 7.7 K/uL   Lymphs Abs 1.5 0.7 - 4.0 K/uL   Monocytes Absolute 0.8 0.1 - 1.0 K/uL   Eosinophils Absolute 0.2 0.0 - 0.7 K/uL   Basophils  Absolute 0.1 0.0 - 0.1 K/uL  Lipid panel     Status: Abnormal   Collection Time: 04/26/16  1:33 PM  Result Value Ref Range   Cholesterol 141 0 - 200 mg/dL    Comment: ATP III Classification       Desirable:  < 200 mg/dL               Borderline High:  200 - 239 mg/dL          High:  > = 240 mg/dL   Triglycerides 120.0 0.0 - 149.0 mg/dL    Comment: Normal:  <150 mg/dLBorderline High:  150 - 199 mg/dL   HDL 36.30 (L) >39.00 mg/dL   VLDL 24.0 0.0 - 40.0 mg/dL   LDL Cholesterol 81 0 - 99 mg/dL   Total CHOL/HDL Ratio 4     Comment:                Men          Women1/2 Average Risk     3.4          3.3Average Risk          5.0          4.42X Average Risk          9.6          7.13X Average Risk          15.0          11.0                       NonHDL 105.08     Comment: NOTE:  Non-HDL goal should be 30 mg/dL higher than patient's LDL goal (i.e. LDL goal of < 70 mg/dL, would have non-HDL goal of < 100 mg/dL)  Myocardial Perfusion Imaging     Status: None   Collection Time: 05/17/16 10:55 AM  Result Value Ref Range   Rest HR 55 bpm   Rest BP 154/87 mmHg   Peak HR 69 bpm   Peak BP 175/68 mmHg   SSS 0    SRS 0    SDS 0    TID 0.89    LV sys vol 63 mL   LV dias vol 142 62 - 150 mL   Lab Results  Component Value Date   HGBA1C 5.6 10/20/2015   MPG 128 (H) 12/05/2012   No results found for: PROLACTIN Lab Results  Component Value Date   CHOL 141 04/26/2016   TRIG 120.0 04/26/2016   HDL 36.30 (L) 04/26/2016   CHOLHDL 4 04/26/2016   VLDL 24.0 04/26/2016   LDLCALC 81 04/26/2016   LDLCALC 77 12/05/2012     Current Medications: Current Outpatient Prescriptions  Medication Sig Dispense Refill  . amLODipine-benazepril (LOTREL) 5-10 MG capsule Take 1 capsule by mouth daily. 30  capsule 5  . apixaban (ELIQUIS) 5 MG TABS tablet Take 1 tablet (5 mg total) by mouth 2 (two) times daily. 180 tablet 3  . aspirin 81 MG tablet Take 81 mg by mouth 2 (two) times daily.     [name] atorvastatin (LIPITOR) 40 MG tablet Take 1 tablet (40 mg total) by mouth daily. 90 tablet 0  . CALCIUM PO Take 500 mg by mouth 3 (three) times daily.     . Cyanocobalamin (VITAMIN B-12) 1000 MCG SUBL Place 1,000 mcg under the tongue every evening.     [name] Sodium (DSS) 100 MG CAPS Take 100 mg  by mouth every other day.    . finasteride (PROSCAR) 5 MG tablet Take 5 mg by mouth daily.     . flecainide (TAMBOCOR) 50 MG tablet TAKE ONE TABLET BY MOUTH EVERY 12 HOURS 180 tablet 2  . furosemide (LASIX) 20 MG tablet TAKE 1 TAB ONCE A DAY AS NEEDED FOR SWELLING IN THE LEGS 90 tablet 1  . loratadine (CLARITIN) 10 MG tablet Take 10 mg by mouth daily.    [name] LORazepam (ATIVAN) 0.5 MG tablet TAKE ONE TABLET BY MOUTH ONCE DAILY IN THE MORNING AND TWO AT BEDTIME 6 tablet 0  . montelukast (SINGULAIR) 10 MG tablet TAKE ONE TABLET BY MOUTH AT BEDTIME 30 tablet 11  . pantoprazole (PROTONIX) 40 MG tablet Take 1 tablet (40 mg total) by mouth 2 (two) times daily. 180 tablet 0  . risperiDONE (RISPERDAL) 0.5 MG tablet Take 1 tablet (0.5 mg total) by mouth at bedtime. 30 tablet 2  . rOPINIRole (REQUIP) 0.25 MG tablet TAKE THREE TABLETS BY MOUTH AT BEDTIME OR  AS  DIRECTED  FOR  LEG  JERKS 90 tablet 11  . sertraline (ZOLOFT) 100 MG tablet TAKE ONE AND ONE-HALF TABLETS BY MOUTH ONCE DAILY WITH  BREAKFAST 45 tablet 2   No current facility-administered medications for this visit.     Neurologic: Headache: No Seizure: No Paresthesias: No  Musculoskeletal: Strength & Muscle Tone: within normal limits Gait & Station: normal Patient leans: N/A  Psychiatric Specialty Exam: Review of Systems  Constitutional: Negative for weight loss.  Musculoskeletal: Positive for back pain.  Skin: Negative for itching and rash.   Neurological: Negative for tremors.    Blood pressure 140/82, pulse 68, height 6' (1.829 m), weight (!) 352 lb (159.7 kg).Body mass index is 47.74 kg/m.  General Appearance: Casual  Eye Contact:  Good  Speech:  Clear and Coherent  Volume:  Normal  Mood:  Euthymic  Affect:  Appropriate  Thought Process:  Goal Directed  Orientation:  Full (Time, Place, and Person)  Thought Content: WDL and Logical   Suicidal Thoughts:  No  Homicidal Thoughts:  No  Memory:  Immediate;   Good Recent;   Good Remote;   Good  Judgement:  Good  Insight:  Good  Psychomotor Activity:  Normal  Concentration:  Concentration: Good and Attention Span: Good  Recall:  Good  Fund of Knowledge: Good  Language: Good  Akathisia:  No  Handed:  Right  AIMS (if indicated):  0  Assets:  Communication Skills Desire for Improvement Housing Resilience  ADL's:  Intact  Cognition: WNL  Sleep:  good     Assessment: Maj. depression , recurrent   Plan: Patient is a stable on his current psychiatric medication.  We discussed weight loss and watch his calorie intake and regular exercise.  I will continue Zoloft 100 mg daily, lorazepam 0.5 mg in the morning and 1 mg at bedtime and Risperdal 0.5 mg at bedtime.  Discussed medication side effects and benefits.  Recommended to call us back if he has any question, concern if he feel worsening of the symptom.  Charmelle Soh T., MD 05/30/2016, 1:25 PM

## 2016-06-01 DIAGNOSIS — I639 Cerebral infarction, unspecified: Secondary | ICD-10-CM

## 2016-06-01 HISTORY — DX: Cerebral infarction, unspecified: I63.9

## 2016-06-04 ENCOUNTER — Other Ambulatory Visit (HOSPITAL_COMMUNITY): Payer: Self-pay | Admitting: Psychiatry

## 2016-06-04 DIAGNOSIS — F330 Major depressive disorder, recurrent, mild: Secondary | ICD-10-CM

## 2016-06-08 ENCOUNTER — Ambulatory Visit (INDEPENDENT_AMBULATORY_CARE_PROVIDER_SITE_OTHER): Payer: Medicare Other | Admitting: Pharmacist Clinician (PhC)/ Clinical Pharmacy Specialist

## 2016-06-08 DIAGNOSIS — I10 Essential (primary) hypertension: Secondary | ICD-10-CM

## 2016-06-08 NOTE — Progress Notes (Signed)
06/08/2016 [name] 1946/01/29 [id number]   HPI:  [name] is a 71 y.o. male patient of [name], with a PMH below who presents today for hypertension clinic evaluation.   His cardiac history is significant for atrial fibrillation, on flecainide, hypertension, hyperlipidemia, OSA on CPAP and recent episode of pericarditis.  He also has morbid obesity, for which he had lap band surgery back in 2014.  His weight dropped to 292 pounds in May of 2015, but was back up to 353 just last month.  He tells me that he has a new incentive to lose the weight, as he has a new granddaughter that is about 21 months old now.  Today is weight is 348.6, so down 5 pounds in the past 2 weeks.    He currently takes amlodipine/benazepril 5-10, which was restarted 2 weeks ago when he saw [name]. Debara [name].  He had also taken this back for a few years, having stopped in 2015 after a hospitalization for pericarditis and pulmonary embolism.  His pressure did well for some time after that, but has noticed that it was creeping back up to levels that needed treatment.    Blood Pressure Goal:  130/80  Current Medications:  Amlodipine/benazepril 5-10 daily  Family Hx:  Father died of MI at 43; paternal grandmother and 1 paternal uncle with heart disease  Mother living at 11 "in better shape than I am", although has some dementia  Social Hx:  No tobacco, quit in early 35's;  Rare social wine or beer;  No caffeine - avoids tea and chocolate, drinks decaf   Diet:  Mix of home and eating out.  Eats some tuna but not much other fish; not into fruit, but eats mix of fresh, canned and frozen; occasional salt but prefers pepper;  Lap band set to eat low amounts; has just recently gone back to post surgery diet of eating 5 small meals per day.  Exercise:  None, had both knees replaced, has numbness in feet - had recent test showing good blood flow  Home BP readings:  Has tried several cuffs, but unable to get  accurate readings.  I suspect this is due to his large upper arms.     Intolerances:   Had some problems with dizziness on previous medications, patient will look in to what those were  Wt Readings from Last 3 Encounters:  06/08/16 (!) 348 lb 9.6 oz (158.1 kg)  05/23/16 (!) 353 lb 6.4 oz (160.3 kg)  05/16/16 (!) 350 lb (158.8 kg)   BP Readings from Last 3 Encounters:  06/08/16 126/72  05/23/16 (!) 168/89  04/28/16 (!) 158/86   Pulse Readings from Last 3 Encounters:  06/08/16 60  05/23/16 60  04/28/16 66    Current Outpatient Prescriptions  Medication Sig Dispense Refill  . amLODipine-benazepril (LOTREL) 5-10 MG capsule Take 1 capsule by mouth daily. 30 capsule 5  . apixaban (ELIQUIS) 5 MG TABS tablet Take 1 tablet (5 mg total) by mouth 2 (two) times daily. 180 tablet 3  . aspirin 81 MG tablet Take 81 mg by mouth 2 (two) times daily.     [name] atorvastatin (LIPITOR) 40 MG tablet Take 1 tablet (40 mg total) by mouth daily. 90 tablet 0  . CALCIUM PO Take 500 mg by mouth 3 (three) times daily.     . Cyanocobalamin (VITAMIN B-12) 1000 MCG SUBL Place 1,000 mcg under the tongue every evening.     . Docusate Sodium (  DSS) 100 MG CAPS Take 100 mg by mouth every other day.    . finasteride (PROSCAR) 5 MG tablet Take 5 mg by mouth daily.     . flecainide (TAMBOCOR) 50 MG tablet TAKE ONE TABLET BY MOUTH EVERY 12 HOURS 180 tablet 2  . furosemide (LASIX) 20 MG tablet TAKE 1 TAB ONCE A DAY AS NEEDED FOR SWELLING IN THE LEGS 90 tablet 1  . loratadine (CLARITIN) 10 MG tablet Take 10 mg by mouth daily.    [name] LORazepam (ATIVAN) 0.5 MG tablet TAKE ONE TABLET BY MOUTH ONCE DAILY IN THE MORNING AND TWO AT BEDTIME 90 tablet 2  . montelukast (SINGULAIR) 10 MG tablet TAKE ONE TABLET BY MOUTH AT BEDTIME 30 tablet 11  . pantoprazole (PROTONIX) 40 MG tablet Take 1 tablet (40 mg total) by mouth 2 (two) times daily. 180 tablet 0  . risperiDONE (RISPERDAL) 0.5 MG tablet Take 1 tablet (0.5 mg total) by mouth at  bedtime. 30 tablet 2  . rOPINIRole (REQUIP) 0.25 MG tablet TAKE THREE TABLETS BY MOUTH AT BEDTIME OR  AS  DIRECTED  FOR  LEG  JERKS 90 tablet 11  . sertraline (ZOLOFT) 100 MG tablet TAKE ONE AND ONE-HALF TABLETS BY MOUTH ONCE DAILY WITH  BREAKFAST 45 tablet 2   No current facility-administered medications for this visit.     Allergies  Allergen Reactions  . Morphine Other (See Comments)    Nausea and Severe hallucinations  . Codeine Nausea Only  . Penicillins Other (See Comments)    LOC with shot  . Propoxyphene N-Acetaminophen Nausea Only    Past Medical History:  Diagnosis Date  . Anemia 07/29/2013  . Asthma    childhood asthma  . Benign prostatic hypertrophy    several prostate biopsies neg for cancer.   . Bilateral hip pain   . Cancer ([place])   . CHF (congestive heart failure) ([place])   . Complication of anesthesia    MORPHINE CAUSES HALLUCINATIONS  . Depression   . FH: colonic polyps 2010  . Gout   . History of kidney stones   . Hypertension   . Kidney cysts   . Morbid obesity ([place])   . Numbness    feet / legs  . PAF (paroxysmal atrial fibrillation) with RVR 07/29/2013  . Sleep apnea    USES C PAP    Blood pressure 126/72, pulse 60, weight (!) 348 lb 9.6 oz (158.1 kg).  HTN (hypertension) Patient with history of hypertension, now appears to be controlled with the amlodipine/benazepril.  I have encouraged him to focus on weight loss, as it could be that he may need less medication if his weight were lower.   He is putting all his energy into eating right and losing weight, as he wants to watch this new granddaughter grow up.  Will have him come back in 3 months for follow up, can consider adjusting his medication based on any weight loss between now and then.    [name] PharmD CPP [facility]

## 2016-06-08 NOTE — Assessment & Plan Note (Signed)
Patient with history of hypertension, now appears to be controlled with the amlodipine/benazepril.  I have encouraged him to focus on weight loss, as it could be that he may need less medication if his weight were lower.   He is putting all his energy into eating right and losing weight, as he wants to watch this new granddaughter grow up.  Will have him come back in 3 months for follow up, can consider adjusting his medication based on any weight loss between now and then.

## 2016-06-08 NOTE — Patient Instructions (Signed)
Return for a a follow up appointment in 3 months  Your blood pressure today is 126/72  Check your blood pressure at home daily (if able) and keep record of the readings.  Take your BP meds as follows:  No changes to medication  Bring all of your meds, your BP cuff and your record of home blood pressures to your next appointment.  Exercise as you're able, try to walk approximately 30 minutes per day.  Keep salt intake to a minimum, especially watch canned and prepared boxed foods.  Eat more fresh fruits and vegetables and fewer canned items.  Avoid eating in fast food restaurants.    HOW TO TAKE YOUR BLOOD PRESSURE: . Rest 5 minutes before taking your blood pressure. .  Don't smoke or drink caffeinated beverages for at least 30 minutes before. . Take your blood pressure before (not after) you eat. . Sit comfortably with your back supported and both feet on the floor (don't cross your legs). . Elevate your arm to heart level on a table or a desk. . Use the proper sized cuff. It should fit smoothly and snugly around your bare upper arm. There should be enough room to slip a fingertip under the cuff. The bottom edge of the cuff should be 1 inch above the crease of the elbow. . Ideally, take 3 measurements at one sitting and record the average.

## 2016-06-16 ENCOUNTER — Other Ambulatory Visit: Payer: Self-pay | Admitting: Internal Medicine

## 2016-06-16 NOTE — Telephone Encounter (Signed)
Requested Prescriptions   Pending Prescriptions Disp Refills  . montelukast (SINGULAIR) 10 MG tablet [Pharmacy Med Name: MONTELUKAST SOD 10 MG TABLET] 30 tablet 1    Sig: TAKE 1 TABLET BY MOUTH ONCE A DAY AT BEDTIME   Last OV 06/2016

## 2016-06-19 ENCOUNTER — Observation Stay (HOSPITAL_COMMUNITY): Payer: Medicare Other

## 2016-06-19 ENCOUNTER — Observation Stay (HOSPITAL_BASED_OUTPATIENT_CLINIC_OR_DEPARTMENT_OTHER): Payer: Medicare Other

## 2016-06-19 ENCOUNTER — Emergency Department (HOSPITAL_COMMUNITY): Payer: Medicare Other

## 2016-06-19 ENCOUNTER — Inpatient Hospital Stay (HOSPITAL_COMMUNITY)
Admission: EM | Admit: 2016-06-19 | Discharge: 2016-06-21 | DRG: 064 | Disposition: A | Discharge status: Rehabilitation Facility | Payer: Medicare Other | Attending: Internal Medicine | Admitting: Internal Medicine

## 2016-06-19 ENCOUNTER — Encounter (HOSPITAL_COMMUNITY): Payer: Self-pay

## 2016-06-19 DIAGNOSIS — M109 Gout, unspecified: Secondary | ICD-10-CM | POA: Diagnosis present

## 2016-06-19 DIAGNOSIS — G8194 Hemiplegia, unspecified affecting left nondominant side: Secondary | ICD-10-CM | POA: Diagnosis present

## 2016-06-19 DIAGNOSIS — R531 Weakness: Secondary | ICD-10-CM

## 2016-06-19 DIAGNOSIS — I69391 Dysphagia following cerebral infarction: Secondary | ICD-10-CM

## 2016-06-19 DIAGNOSIS — I6340 Cerebral infarction due to embolism of unspecified cerebral artery: Principal | ICD-10-CM | POA: Diagnosis present

## 2016-06-19 DIAGNOSIS — Z87891 Personal history of nicotine dependence: Secondary | ICD-10-CM

## 2016-06-19 DIAGNOSIS — E785 Hyperlipidemia, unspecified: Secondary | ICD-10-CM

## 2016-06-19 DIAGNOSIS — Z79899 Other long term (current) drug therapy: Secondary | ICD-10-CM

## 2016-06-19 DIAGNOSIS — G459 Transient cerebral ischemic attack, unspecified: Secondary | ICD-10-CM

## 2016-06-19 DIAGNOSIS — R52 Pain, unspecified: Secondary | ICD-10-CM | POA: Diagnosis not present

## 2016-06-19 DIAGNOSIS — J45909 Unspecified asthma, uncomplicated: Secondary | ICD-10-CM | POA: Diagnosis present

## 2016-06-19 DIAGNOSIS — E876 Hypokalemia: Secondary | ICD-10-CM

## 2016-06-19 DIAGNOSIS — M6281 Muscle weakness (generalized): Secondary | ICD-10-CM | POA: Diagnosis not present

## 2016-06-19 DIAGNOSIS — R519 Headache, unspecified: Secondary | ICD-10-CM | POA: Diagnosis present

## 2016-06-19 DIAGNOSIS — I69393 Ataxia following cerebral infarction: Secondary | ICD-10-CM

## 2016-06-19 DIAGNOSIS — I6502 Occlusion and stenosis of left vertebral artery: Secondary | ICD-10-CM | POA: Diagnosis present

## 2016-06-19 DIAGNOSIS — Z96653 Presence of artificial knee joint, bilateral: Secondary | ICD-10-CM | POA: Diagnosis present

## 2016-06-19 DIAGNOSIS — I5033 Acute on chronic diastolic (congestive) heart failure: Secondary | ICD-10-CM | POA: Diagnosis not present

## 2016-06-19 DIAGNOSIS — F39 Unspecified mood [affective] disorder: Secondary | ICD-10-CM | POA: Diagnosis present

## 2016-06-19 DIAGNOSIS — I509 Heart failure, unspecified: Secondary | ICD-10-CM | POA: Diagnosis not present

## 2016-06-19 DIAGNOSIS — I639 Cerebral infarction, unspecified: Secondary | ICD-10-CM | POA: Diagnosis not present

## 2016-06-19 DIAGNOSIS — Z7982 Long term (current) use of aspirin: Secondary | ICD-10-CM

## 2016-06-19 DIAGNOSIS — I5030 Unspecified diastolic (congestive) heart failure: Secondary | ICD-10-CM | POA: Diagnosis present

## 2016-06-19 DIAGNOSIS — R0602 Shortness of breath: Secondary | ICD-10-CM | POA: Diagnosis not present

## 2016-06-19 DIAGNOSIS — R51 Headache: Secondary | ICD-10-CM

## 2016-06-19 DIAGNOSIS — R1310 Dysphagia, unspecified: Secondary | ICD-10-CM | POA: Diagnosis not present

## 2016-06-19 DIAGNOSIS — Z7901 Long term (current) use of anticoagulants: Secondary | ICD-10-CM

## 2016-06-19 DIAGNOSIS — G4733 Obstructive sleep apnea (adult) (pediatric): Secondary | ICD-10-CM | POA: Diagnosis not present

## 2016-06-19 DIAGNOSIS — N400 Enlarged prostate without lower urinary tract symptoms: Secondary | ICD-10-CM | POA: Diagnosis present

## 2016-06-19 DIAGNOSIS — I6349 Cerebral infarction due to embolism of other cerebral artery: Secondary | ICD-10-CM

## 2016-06-19 DIAGNOSIS — I10 Essential (primary) hypertension: Secondary | ICD-10-CM | POA: Diagnosis present

## 2016-06-19 DIAGNOSIS — Z86711 Personal history of pulmonary embolism: Secondary | ICD-10-CM

## 2016-06-19 DIAGNOSIS — R269 Unspecified abnormalities of gait and mobility: Secondary | ICD-10-CM

## 2016-06-19 DIAGNOSIS — I110 Hypertensive heart disease with heart failure: Secondary | ICD-10-CM | POA: Diagnosis present

## 2016-06-19 DIAGNOSIS — Z6841 Body Mass Index (BMI) 40.0 and over, adult: Secondary | ICD-10-CM

## 2016-06-19 DIAGNOSIS — K219 Gastro-esophageal reflux disease without esophagitis: Secondary | ICD-10-CM | POA: Diagnosis present

## 2016-06-19 DIAGNOSIS — I5032 Chronic diastolic (congestive) heart failure: Secondary | ICD-10-CM

## 2016-06-19 DIAGNOSIS — G4489 Other headache syndrome: Secondary | ICD-10-CM | POA: Diagnosis not present

## 2016-06-19 DIAGNOSIS — IMO0002 Reserved for concepts with insufficient information to code with codable children: Secondary | ICD-10-CM

## 2016-06-19 DIAGNOSIS — I480 Paroxysmal atrial fibrillation: Secondary | ICD-10-CM | POA: Diagnosis not present

## 2016-06-19 DIAGNOSIS — F329 Major depressive disorder, single episode, unspecified: Secondary | ICD-10-CM | POA: Diagnosis present

## 2016-06-19 HISTORY — DX: Cerebral infarction, unspecified: I63.9

## 2016-06-19 LAB — DIFFERENTIAL
BASOS ABS: 0.1 10*3/uL (ref 0.0–0.1)
BASOS PCT: 1 %
EOS ABS: 0.2 10*3/uL (ref 0.0–0.7)
EOS PCT: 2 %
Lymphocytes Relative: 26 %
Lymphs Abs: 2.6 10*3/uL (ref 0.7–4.0)
Monocytes Absolute: 1.1 10*3/uL — ABNORMAL HIGH (ref 0.1–1.0)
Monocytes Relative: 11 %
NEUTROS PCT: 60 %
Neutro Abs: 6.1 10*3/uL (ref 1.7–7.7)

## 2016-06-19 LAB — COMPREHENSIVE METABOLIC PANEL
ALBUMIN: 3.3 g/dL — AB (ref 3.5–5.0)
ALT: 21 U/L (ref 17–63)
ANION GAP: 11 (ref 5–15)
AST: 33 U/L (ref 15–41)
Alkaline Phosphatase: 86 U/L (ref 38–126)
BUN: 14 mg/dL (ref 6–20)
CHLORIDE: 104 mmol/L (ref 101–111)
CO2: 24 mmol/L (ref 22–32)
Calcium: 8.2 mg/dL — ABNORMAL LOW (ref 8.9–10.3)
Creatinine, Ser: 1.07 mg/dL (ref 0.61–1.24)
GFR calc Af Amer: >60 mL/min (ref >60)
GFR calc non Af Amer: >60 mL/min (ref >60)
GLUCOSE: 113 mg/dL — AB (ref 65–99)
POTASSIUM: 3.8 mmol/L (ref 3.5–5.1)
SODIUM: 139 mmol/L (ref 135–145)
TOTAL PROTEIN: 5.9 g/dL — AB (ref 6.5–8.1)
Total Bilirubin: 0.9 mg/dL (ref 0.3–1.2)

## 2016-06-19 LAB — CBC
HCT: 44.4 % (ref 39.0–52.0)
HEMOGLOBIN: 14.6 g/dL (ref 13.0–17.0)
MCH: 28.9 pg (ref 26.0–34.0)
MCHC: 32.9 g/dL (ref 30.0–36.0)
MCV: 87.7 fL (ref 78.0–100.0)
Platelets: 183 10*3/uL (ref 150–400)
RBC: 5.06 MIL/uL (ref 4.22–5.81)
RDW: 14.1 % (ref 11.5–15.5)
WBC: 10 10*3/uL (ref 4.0–10.5)

## 2016-06-19 LAB — SEDIMENTATION RATE: SED RATE: 13 mm/h (ref 0–16)

## 2016-06-19 LAB — I-STAT CHEM 8, ED
BUN: 15 mg/dL (ref 6–20)
CALCIUM ION: 0.93 mmol/L — AB (ref 1.15–1.40)
CHLORIDE: 104 mmol/L (ref 101–111)
Creatinine, Ser: 1 mg/dL (ref 0.61–1.24)
Glucose, Bld: 115 mg/dL — ABNORMAL HIGH (ref 65–99)
HEMATOCRIT: 43 % (ref 39.0–52.0)
Hemoglobin: 14.6 g/dL (ref 13.0–17.0)
Potassium: 3.2 mmol/L — ABNORMAL LOW (ref 3.5–5.1)
SODIUM: 141 mmol/L (ref 135–145)
TCO2: 25 mmol/L (ref 0–100)

## 2016-06-19 LAB — LIPID PANEL
CHOLESTEROL: 124 mg/dL (ref 0–200)
HDL: 32 mg/dL — ABNORMAL LOW (ref >40)
LDL Cholesterol: 77 mg/dL (ref 0–99)
Total CHOL/HDL Ratio: 3.9 RATIO
Triglycerides: 74 mg/dL (ref <150)
VLDL: 15 mg/dL (ref 0–40)

## 2016-06-19 LAB — PROTIME-INR
INR: 1.09
PROTHROMBIN TIME: 14.2 s (ref 11.4–15.2)

## 2016-06-19 LAB — ECHOCARDIOGRAM COMPLETE: Weight: 5728.43 oz

## 2016-06-19 LAB — I-STAT TROPONIN, ED: Troponin i, poc: 0 ng/mL (ref 0.00–0.08)

## 2016-06-19 LAB — CBG MONITORING, ED: GLUCOSE-CAPILLARY: 105 mg/dL — AB (ref 65–99)

## 2016-06-19 LAB — APTT: APTT: 25 s (ref 24–36)

## 2016-06-19 MED ORDER — IOPAMIDOL (ISOVUE-370) INJECTION 76%
INTRAVENOUS | Status: AC
  Administered 2016-06-19: 50 mL via INTRAVENOUS
  Filled 2016-06-19: qty 50

## 2016-06-19 MED ORDER — BENAZEPRIL HCL 10 MG PO TABS
10.0000 mg | ORAL_TABLET | Freq: Every day | ORAL | Status: DC
  Administered 2016-06-19: 10 mg via ORAL
  Filled 2016-06-19 (×2): qty 1

## 2016-06-19 MED ORDER — ACETAMINOPHEN 500 MG PO TABS
1000.0000 mg | ORAL_TABLET | Freq: Three times a day (TID) | ORAL | Status: DC | PRN
  Administered 2016-06-19: 1000 mg via ORAL
  Filled 2016-06-19: qty 2

## 2016-06-19 MED ORDER — ASPIRIN EC 81 MG PO TBEC: 81.0000 mg | DELAYED_RELEASE_TABLET | Freq: Two times a day (BID) | ORAL | Status: DC

## 2016-06-19 MED ORDER — FLECAINIDE ACETATE 50 MG PO TABS
50.0000 mg | ORAL_TABLET | Freq: Two times a day (BID) | ORAL | Status: DC
Start: 2016-06-19 — End: 2016-06-21
  Administered 2016-06-19 – 2016-06-21 (×2): 50 mg via ORAL
  Filled 2016-06-19 (×5): qty 1

## 2016-06-19 MED ORDER — SERTRALINE HCL 50 MG PO TABS
150.0000 mg | ORAL_TABLET | Freq: Every day | ORAL | Status: DC
  Administered 2016-06-21: 150 mg via ORAL
  Filled 2016-06-19 (×2): qty 1

## 2016-06-19 MED ORDER — FUROSEMIDE 10 MG/ML IJ SOLN
40.0000 mg | Freq: Every day | INTRAMUSCULAR | Status: DC
  Administered 2016-06-19 – 2016-06-20 (×2): 40 mg via INTRAVENOUS
  Filled 2016-06-19: qty 4

## 2016-06-19 MED ORDER — ROPINIROLE HCL 0.5 MG PO TABS
0.7500 mg | ORAL_TABLET | Freq: Every day | ORAL | Status: DC
  Filled 2016-06-19: qty 1

## 2016-06-19 MED ORDER — LORATADINE 10 MG PO TABS: 10.0000 mg | ORAL_TABLET | Freq: Every day | ORAL | Status: DC

## 2016-06-19 MED ORDER — TETRACAINE HCL 0.5 % OP SOLN
2.0000 [drp] | Freq: Once | OPHTHALMIC | Status: AC
  Administered 2016-06-19: 2 [drp] via OPHTHALMIC
  Filled 2016-06-19: qty 2

## 2016-06-19 MED ORDER — HYDRALAZINE HCL 20 MG/ML IJ SOLN: 10.0000 mg | Freq: Four times a day (QID) | INTRAMUSCULAR | Status: DC | PRN

## 2016-06-19 MED ORDER — LORAZEPAM 0.5 MG PO TABS: 0.5000 mg | ORAL_TABLET | ORAL | Status: DC

## 2016-06-19 MED ORDER — PANTOPRAZOLE SODIUM 40 MG PO TBEC: 40.0000 mg | DELAYED_RELEASE_TABLET | Freq: Two times a day (BID) | ORAL | Status: DC

## 2016-06-19 MED ORDER — POTASSIUM CHLORIDE CRYS ER 20 MEQ PO TBCR
40.0000 meq | EXTENDED_RELEASE_TABLET | Freq: Four times a day (QID) | ORAL | Status: AC
  Administered 2016-06-19 (×2): 40 meq via ORAL
  Filled 2016-06-19 (×2): qty 2

## 2016-06-19 MED ORDER — LORAZEPAM 1 MG PO TABS: 1.0000 mg | ORAL_TABLET | ORAL | Status: DC

## 2016-06-19 MED ORDER — SODIUM CHLORIDE 0.9% FLUSH
3.0000 mL | Freq: Two times a day (BID) | INTRAVENOUS | Status: DC
  Administered 2016-06-19 – 2016-06-21 (×3): 3 mL via INTRAVENOUS

## 2016-06-19 MED ORDER — RISPERIDONE 0.5 MG PO TABS
0.5000 mg | ORAL_TABLET | Freq: Every day | ORAL | Status: DC
  Filled 2016-06-19 (×2): qty 1

## 2016-06-19 MED ORDER — MOMETASONE FURO-FORMOTEROL FUM 200-5 MCG/ACT IN AERO
2.0000 | INHALATION_SPRAY | Freq: Two times a day (BID) | RESPIRATORY_TRACT | Status: DC
  Filled 2016-06-19 (×2): qty 8.8

## 2016-06-19 MED ORDER — LORAZEPAM 1 MG PO TABS: 0.5000 mg | ORAL_TABLET | Freq: Two times a day (BID) | ORAL | Status: DC

## 2016-06-19 MED ORDER — ATORVASTATIN CALCIUM 80 MG PO TABS: 80.0000 mg | ORAL_TABLET | Freq: Every day | ORAL | Status: DC

## 2016-06-19 MED ORDER — APIXABAN 5 MG PO TABS
5.0000 mg | ORAL_TABLET | Freq: Two times a day (BID) | ORAL | Status: DC
  Administered 2016-06-19: 5 mg via ORAL
  Filled 2016-06-19: qty 1

## 2016-06-19 MED ORDER — ATORVASTATIN CALCIUM 40 MG PO TABS
40.0000 mg | ORAL_TABLET | Freq: Every day | ORAL | Status: DC
  Filled 2016-06-19: qty 1

## 2016-06-19 MED ORDER — ONDANSETRON HCL 4 MG/2ML IJ SOLN
4.0000 mg | Freq: Four times a day (QID) | INTRAMUSCULAR | Status: DC | PRN
  Administered 2016-06-19: 4 mg via INTRAVENOUS
  Filled 2016-06-19: qty 2

## 2016-06-19 MED ORDER — FINASTERIDE 5 MG PO TABS
5.0000 mg | ORAL_TABLET | Freq: Every day | ORAL | Status: DC
  Administered 2016-06-21: 5 mg via ORAL
  Filled 2016-06-19 (×2): qty 1

## 2016-06-19 MED ORDER — ONDANSETRON HCL 4 MG PO TABS
4.0000 mg | ORAL_TABLET | Freq: Four times a day (QID) | ORAL | Status: DC | PRN
  Administered 2016-06-19: 4 mg via ORAL
  Filled 2016-06-19: qty 1

## 2016-06-19 MED ORDER — MONTELUKAST SODIUM 10 MG PO TABS
10.0000 mg | ORAL_TABLET | Freq: Every day | ORAL | Status: DC
  Filled 2016-06-19: qty 1

## 2016-06-19 MED ORDER — AMLODIPINE BESYLATE 5 MG PO TABS
5.0000 mg | ORAL_TABLET | Freq: Every day | ORAL | Status: DC
  Administered 2016-06-19: 5 mg via ORAL
  Filled 2016-06-19: qty 1

## 2016-06-19 MED ORDER — STROKE: EARLY STAGES OF RECOVERY BOOK
Freq: Once | Status: AC
  Administered 2016-06-20: 1
  Filled 2016-06-19: qty 1

## 2016-06-19 MED ORDER — AMLODIPINE BESY-BENAZEPRIL HCL 5-10 MG PO CAPS: 1.0000 | ORAL_CAPSULE | Freq: Every day | ORAL | Status: DC

## 2016-06-19 MED ORDER — FUROSEMIDE 20 MG PO TABS: 20.0000 mg | ORAL_TABLET | Freq: Every day | ORAL | Status: DC

## 2016-06-19 NOTE — ED Notes (Signed)
Admitting MD at the bedside.  

## 2016-06-19 NOTE — ED Notes (Signed)
Physical Therapy at the bedside 

## 2016-06-19 NOTE — ED Notes (Signed)
Attempted Report x1.   

## 2016-06-19 NOTE — Progress Notes (Signed)
PROGRESS NOTE                                                                                                                                                                                                             Patient Demographics:    [name], is a 71 y.o. male, DOB - Sep 20, 1945, CWC:[id number]  Admit date - 06/19/2016   Admitting Physician No admitting provider for patient encounter.  Outpatient Primary MD for the patient is [name], MD  LOS - 0  Chief Complaint  Patient presents with  . Code Stroke    LSN [id number]       Brief Narrative  [name] is a 71 y.o. male with a past medical history significant for Afib and PE on Eliquis, HFpEF, HTN, hx of pericardial effusion s/p window, and hx of OSA on CPAP who presents with acute headache and ataxia. His workup showed possible left medullary small acute infarct along with possible left vertebral artery occlusion.     Subjective:    [name] today has, No headache, No chest pain, No abdominal pain - No Nausea, No new weakness tingling or numbness, No Cough - SOB, had some trouble swallowing earlier.   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  Lab Results  Component Value Date   HGBA1C 5.6 10/20/2015   Lab Results  Component Value Date   CHOL 124 06/19/2016   HDL 32 (L) 06/19/2016   LDLCALC 77 06/19/2016   LDLDIRECT 168.0 10/20/2015   TRIG 74 06/19/2016   CHOLHDL 3.9 06/19/2016    2. Seizure. Likely due to #1 above. Nothing by mouth except medications in applesauce, speech to  evaluate.  3. History of PE. Continue anticoagulation.  4. Hypertension. Allow for permissive hypertension, as needed hydralazine.  5. BPH. Continue Proscar.  6. GERD. On PPI.  7. Paroxysmal atrial fibrillation. [place] vasc 2 score of at least 4. Continue Eliquis, currently appears to  be in sinus.  8. Mild acute on chronic diastolic CHF last EF 49%. Lasix IV, repeat echo and monitor.  9. Hypokalemia. Replaced.  10. Mood disorder. Continue home medications unchanged.    Diet : Diet NPO time specified Except for: Sips with Meds    Family Communication  :  wife  Code Status :  Full  Disposition Plan  :  CIR vs SNF  Consults  :  Neuro  Procedures  :    CT-MRI Brain, CTA Head and Neck - Possible small left medical infarct along with possible left vertebral artery occlusion.  DVT Prophylaxis  :  Eliquis  Lab Results  Component Value Date   PLT 183 06/19/2016    Inpatient Medications  Scheduled Meds: .  stroke: mapping our early stages of recovery book   Does not apply Once  . apixaban  5 mg Oral BID  . aspirin EC  81 mg Oral BID  . [START ON 06/20/2016] atorvastatin  80 mg Oral q1800  . finasteride  5 mg Oral Daily  . flecainide  50 mg Oral Q12H  . furosemide  40 mg Intravenous Daily  . furosemide  20 mg Oral Daily  . loratadine  10 mg Oral Daily  . LORazepam  0.5 mg Oral Q24H   And  . LORazepam  1 mg Oral Q24H  . mometasone-formoterol  2 puff Inhalation BID  . montelukast  10 mg Oral QHS  . pantoprazole  40 mg Oral BID  . potassium chloride  40 mEq Oral Q6H  . risperiDONE  0.5 mg Oral QHS  . rOPINIRole  0.75 mg Oral QHS  . sertraline  150 mg Oral Daily  . sodium chloride flush  3 mL Intravenous Q12H   Continuous Infusions: PRN Meds:.acetaminophen, hydrALAZINE, ondansetron **OR** ondansetron (ZOFRAN) IV  Antibiotics  :    Anti-infectives    None         Objective:   Vitals:   06/19/16 1100 06/19/16 1139 06/19/16 1140 06/19/16 1200  BP: (!) 140/55  (!) 153/60 (!) 153/60 (!) 149/62  Pulse: 64  63 61  Resp: 11  19 11   Temp:   98.2 F (36.8 C)   TempSrc:   Oral   SpO2: 92%  97% 98%  Weight:        Wt Readings from Last 3 Encounters:  06/19/16 (S) (!) 162.4 kg (358 lb 0.4 oz)  06/08/16 (!) 158.1 kg (348 lb 9.6 oz)  05/23/16 (!) 160.3 kg (353 lb 6.4 oz)     Intake/Output Summary (Last 24 hours) at 06/19/16 1315 Last data filed at 06/19/16 0948  Gross per 24 hour  Intake                3 ml  Output                0 ml  Net                3 ml     Physical Exam  Awake Alert, Oriented X 3, No new F.N deficits, Normal affect [place].AT,PERRAL Supple Neck,No JVD, No cervical lymphadenopathy appriciated.  Symmetrical Chest wall movement, Good air movement bilaterally, CTAB RRR,No Gallops,Rubs or new Murmurs, No Parasternal Heave +ve B.Sounds, Abd Soft, No tenderness, No organomegaly appriciated, No rebound - guarding or rigidity. No Cyanosis, Clubbing or edema, No new Rash or bruise       Data Review:    CBC  Recent Labs Lab 06/19/16 0223 06/19/16  0233  WBC 10.0  --   HGB 14.6 14.6  HCT 44.4 43.0  PLT 183  --   MCV 87.7  --   MCH 28.9  --   MCHC 32.9  --   RDW 14.1  --   LYMPHSABS 2.6  --   MONOABS 1.1*  --   EOSABS 0.2  --   BASOSABS 0.1  --     Chemistries   Recent Labs Lab 06/19/16 0223 06/19/16 0233  NA 139 141  K 3.8 3.2*  CL 104 104  CO2 24  --   GLUCOSE 113* 115*  BUN 14 15  CREATININE 1.07 1.00  CALCIUM 8.2*  --   AST 33  --   ALT 21  --   ALKPHOS 86  --   BILITOT 0.9  --    ------------------------------------------------------------------------------------------------------------------  Recent Labs  06/19/16 0730  CHOL 124  HDL 32*  LDLCALC 77  TRIG 74  CHOLHDL 3.9    Lab Results  Component Value Date   HGBA1C 5.6 10/20/2015   ------------------------------------------------------------------------------------------------------------------ No results for input(s): TSH,  T4TOTAL, T3FREE, THYROIDAB in the last 72 hours.  Invalid input(s): FREET3 ------------------------------------------------------------------------------------------------------------------ No results for input(s): VITAMINB12, FOLATE, FERRITIN, TIBC, IRON, RETICCTPCT in the last 72 hours.  Coagulation profile  Recent Labs Lab 06/19/16 0223  INR 1.09    No results for input(s): DDIMER in the last 72 hours.  Cardiac Enzymes No results for input(s): CKMB, TROPONINI, MYOGLOBIN in the last 168 hours.  Invalid input(s): CK ------------------------------------------------------------------------------------------------------------------    Component Value Date/Time   BNP 88.3 06/14/2015 0345    Micro Results No results found for this or any previous visit (from the past 240 hour(s)).  Radiology Reports Ct Angio Head W Or Wo Contrast  Result Date: 06/19/2016 CLINICAL DATA:  Left-sided weakness.  Concern for dissection. EXAM: CT ANGIOGRAPHY HEAD AND NECK TECHNIQUE: Multidetector CT imaging of the head and neck was performed using the standard protocol during bolus administration of intravenous contrast. Multiplanar CT image reconstructions and MIPs were obtained to evaluate the vascular anatomy. Carotid stenosis measurements (when applicable) are obtained utilizing NASCET criteria, using the distal internal carotid diameter as the denominator. CONTRAST:  50 mL Isovue 370 IV COMPARISON:  Head CT 06/19/2016 FINDINGS: The examination is degraded by poor contrast bolus. CTA NECK FINDINGS Aortic arch: There is no aneurysm or dissection of the visualized ascending aorta or aortic arch. There is an average right subclavian artery. The brachiocephalic and left common carotid arteries share a common origin. The visualized proximal subclavian arteries are normal. There is calcific aortic atherosclerosis. Right carotid system: There is no common carotid or internal carotid artery dissection or aneurysm.  No hemodynamically significant stenosis. Left carotid system: There is no common carotid or internal carotid artery dissection or aneurysm. Minimal bifurcation atherosclerosis without hemodynamically significant stenosis. Vertebral arteries: The vertebral system is left dominant. Assessment of the vertebral artery origins and proximal V2 segments is severely degraded by poor signal to noise ratio. There is abrupt termination of the left vertebral artery at the junction of the V3 and V4 segments, at the skullbase (sagittal images 166). Skeleton: There is no bony spinal canal stenosis. No lytic or blastic lesions. Other neck: The nasopharynx is clear. The oropharynx and hypopharynx are normal. The epiglottis is normal. The supraglottic larynx, glottis and subglottic larynx are normal. No retropharyngeal collection. The parapharyngeal spaces are preserved. The parotid and submandibular glands are normal. No sialolithiasis or salivary ductal dilatation. The thyroid gland  is normal. There is no cervical lymphadenopathy. Upper chest: No pneumothorax or pleural effusion. No nodules or masses. Review of the MIP images confirms the above findings CTA HEAD FINDINGS Anterior circulation: --Intracranial internal carotid arteries: Normal. --Anterior cerebral arteries: Normal. --Middle cerebral arteries: Normal proximally. The poor contrast bolus degrades assessment of the distal circulation. --Posterior communicating arteries: Present on the right. Posterior circulation: --Posterior cerebral arteries: Normal proximally. Poor contrast bolus degrades distal assessment. --Superior cerebellar arteries: Normal on the right. Not clearly visualized on the left. --Basilar artery: Normal. --Anterior inferior cerebellar arteries: Normal. --Posterior inferior cerebellar arteries: Normal on the right. Poorly opacified on the left. Venous sinuses: As permitted by contrast timing, patent. Anatomic variants: None Delayed phase: No parenchymal  contrast enhancement. Review of the MIP images confirms the above findings IMPRESSION: 1. Examination degraded by poor contrast bolus. 2. 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 This suggests acute dissection, though occlusive thrombus could cause the same appearance. 3. No proximal occlusion of the intracranial arteries. 4. No hemodynamically significant stenosis or dissection of the carotid arteries. These results were called by telephone at the time of interpretation on 06/19/2016 at 3:15 am to Dr. Roland Rack , who verbally acknowledged these results. Electronically Signed   By: Ulyses Jarred M.D.   On: 06/19/2016 03:25   Dg Chest 2 View  Result Date: 06/19/2016 CLINICAL DATA:  SOB,WEAKNESS ON LEFT,HX HTN,CHF,ASTHMA EXAM: CHEST  2 VIEW COMPARISON:  06/13/2015; chest CT - 06/13/2015 FINDINGS: Grossly unchanged enlarged cardiac silhouette and mediastinal contours. Grossly unchanged minimal bibasilar linear heterogeneous opacities, left greater than right, favored to represent atelectasis or scar. Resolution of previously noted heterogeneous opacity with the peripheral aspect the right mid lung. No focal airspace opacities. No definite pleural effusion, though note, the costophrenic angles are excluded on the provided lateral radiograph. No pneumothorax. No evidence of edema. No acute osseus abnormalities. IMPRESSION: Bibasilar atelectasis/scar without superimposed acute cardiopulmonary disease. Electronically Signed   By: Sandi Mariscal M.D.   On: 06/19/2016 09:07   Ct Angio Neck W Or Wo Contrast  Result Date: 06/19/2016 CLINICAL DATA:  Left-sided weakness.  Concern for dissection. EXAM: CT ANGIOGRAPHY HEAD AND NECK TECHNIQUE: Multidetector CT imaging of the head and neck was performed using the standard protocol during bolus administration of intravenous contrast. Multiplanar CT image reconstructions and MIPs were obtained to evaluate the vascular anatomy. Carotid  stenosis measurements (when applicable) are obtained utilizing NASCET criteria, using the distal internal carotid diameter as the denominator. CONTRAST:  50 mL Isovue 370 IV COMPARISON:  Head CT 06/19/2016 FINDINGS: The examination is degraded by poor contrast bolus. CTA NECK FINDINGS Aortic arch: There is no aneurysm or dissection of the visualized ascending aorta or aortic arch. There is an average right subclavian artery. The brachiocephalic and left common carotid arteries share a common origin. The visualized proximal subclavian arteries are normal. There is calcific aortic atherosclerosis. Right carotid system: There is no common carotid or internal carotid artery dissection or aneurysm. No hemodynamically significant stenosis. Left carotid system: There is no common carotid or internal carotid artery dissection or aneurysm. Minimal bifurcation atherosclerosis without hemodynamically significant stenosis. Vertebral arteries: The vertebral system is left dominant. Assessment of the vertebral artery origins and proximal V2 segments is severely degraded by poor signal to noise ratio. There is abrupt termination of the left vertebral artery at the junction of the V3 and V4 segments, at the skullbase (sagittal images 166). Skeleton: There is no bony spinal canal  stenosis. No lytic or blastic lesions. Other neck: The nasopharynx is clear. The oropharynx and hypopharynx are normal. The epiglottis is normal. The supraglottic larynx, glottis and subglottic larynx are normal. No retropharyngeal collection. The parapharyngeal spaces are preserved. The parotid and submandibular glands are normal. No sialolithiasis or salivary ductal dilatation. The thyroid gland is normal. There is no cervical lymphadenopathy. Upper chest: No pneumothorax or pleural effusion. No nodules or masses. Review of the MIP images confirms the above findings CTA HEAD FINDINGS Anterior circulation: --Intracranial internal carotid arteries: Normal.  --Anterior cerebral arteries: Normal. --Middle cerebral arteries: Normal proximally. The poor contrast bolus degrades assessment of the distal circulation. --Posterior communicating arteries: Present on the right. Posterior circulation: --Posterior cerebral arteries: Normal proximally. Poor contrast bolus degrades distal assessment. --Superior cerebellar arteries: Normal on the right. Not clearly visualized on the left. --Basilar artery: Normal. --Anterior inferior cerebellar arteries: Normal. --Posterior inferior cerebellar arteries: Normal on the right. Poorly opacified on the left. Venous sinuses: As permitted by contrast timing, patent. Anatomic variants: None Delayed phase: No parenchymal contrast enhancement. Review of the MIP images confirms the above findings IMPRESSION: 1. Examination degraded by poor contrast bolus. 2. 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 This suggests acute dissection, though occlusive thrombus could cause the same appearance. 3. No proximal occlusion of the intracranial arteries. 4. No hemodynamically significant stenosis or dissection of the carotid arteries. These results were called by telephone at the time of interpretation on 06/19/2016 at 3:15 am to Dr. Roland Rack , who verbally acknowledged these results. Electronically Signed   By: Ulyses Jarred M.D.   On: 06/19/2016 03:25   Mr Brain Wo Contrast  Result Date: 06/19/2016 CLINICAL DATA:  Left retro-orbital headache and sensation of staggering to the left or leaning. EXAM: MRI HEAD WITHOUT CONTRAST TECHNIQUE: Multiplanar, multiecho pulse sequences of the brain and surrounding structures were obtained without intravenous contrast. COMPARISON:  Head CT 06/19/2016 Brain MRI 12/16/2013 FINDINGS: Brain: There is a subtle focus of reduced diffusion within the left aspect of the medulla (series 5, image 10, series 6, image 15). The finding is duplicated on the coronal diffusion weighted  imaging, which argues against the possibility that it is an artifact. The brain parenchymal signal is otherwise normal. No mass lesion or midline shift. No hydrocephalus or extra-axial fluid collection. The midline structures are normal. No age advanced or lobar predominant atrophy. Vascular: There is loss of the normal flow void within knee left vertebral artery. The other major intracranial flow voids are preserved. Remote microhemorrhage in the left pons. Skull and upper cervical spine: The visualized skull base, calvarium, upper cervical spine and extracranial soft tissues are normal. Sinuses/Orbits: No fluid levels or advanced mucosal thickening. No mastoid effusion. Normal orbits. IMPRESSION: 1. Faint focus of reduced diffusion within the left aspect of the medulla, in the region of the gracile fasciculus, concerning for a small acute infarct, particularly in the context of the reported symptoms. 2. Abnormal signal within the V4 segment of the left vertebral artery, compatible with occlusion seen on earlier CTA. 3. Single focus of remote microhemorrhage in the left pons but no acute intracranial hemorrhage. Electronically Signed   By: Ulyses Jarred M.D.   On: 06/19/2016 06:31   Ct Head Code Stroke W/o Cm  Result Date: 06/19/2016 CLINICAL DATA:  Code stroke.  Left-sided weakness EXAM: CT HEAD WITHOUT CONTRAST TECHNIQUE: Contiguous axial images were obtained from the base of the skull through the vertex without intravenous contrast.  COMPARISON:  None. FINDINGS: Brain: No mass lesion, intraparenchymal hemorrhage or extra-axial collection. No evidence of acute cortical infarct. Brain parenchyma and CSF-containing spaces are normal for age. Vascular: No hyperdense vessel or unexpected calcification. Skull: Normal visualized skull base, calvarium and extracranial soft tissues. Sinuses/Orbits: No sinus fluid levels or advanced mucosal thickening. No mastoid effusion. Normal orbits. ASPECTS [facility] Stroke Program  Early CT Score) - Ganglionic level infarction (caudate, lentiform nuclei, internal capsule, insula, M1-M3 cortex): 7 - Supraganglionic infarction (M4-M6 cortex): 3 Total score (0-10 with 10 being normal): 10 IMPRESSION: 1. No acute intracranial abnormality. 2. ASPECTS is 10. These results were called by telephone at the time of interpretation on 06/19/2016 at 2:42 am to Dr. Roland Rack, who verbally acknowledged these results. Electronically Signed   By: Ulyses Jarred M.D.   On: 06/19/2016 02:42    Time Spent in minutes  30   Nixon Sparr K M.D on 06/19/2016 at 1:15 PM  Between 7am to 7pm - Pager - [phone] ( page via [facility] text pages only, please mention full 10 digit call back number).  After 7pm go to [email or web address] - password [facility]  [facility] -  Office  [phone]

## 2016-06-19 NOTE — ED Notes (Signed)
[id number] labs clicked off in error.  Phleb aware and will collect at 0730.

## 2016-06-19 NOTE — ED Notes (Signed)
Pt states while in MRI he was ask to stand and transfer to another stretcher and became very dizzy and felt as if he was falling to the left. Pt states he feels the same when he sits up.

## 2016-06-19 NOTE — Evaluation (Signed)
Occupational Therapy Evaluation Patient Details Name: [name] MRN: [id number] DOB: September 04, 1945 Today's Date: 06/19/2016    History of Present Illness 71 yo male admitted with single focus of remote microhemorrhage in the L Pon, abnormal signal within V4 segment of L vertebral artery, faint focus of reduced diffusion within L medulla concerning for small infarct   Clinical Impression   PT admitted with L medulla small infarct. Pt currently with functional limitiations due to the deficits listed below (see OT problem list). Pt currently demonstrates LOB with static standing and strong L lean in static standing with total + 2 (A). Pt requires max (A) for all adls due to balance deficits.  Pt will benefit from skilled OT to increase their independence and safety with adls and balance to allow discharge CIR. Pt reports new pain in the L posterior aspect of his neck after session.      Follow Up Recommendations  CIR    Equipment Recommendations  3 in 1 bedside commode;Other (comment) (RW)    Recommendations for Other Services Speech consult     Precautions / Restrictions Precautions Precautions: Fall      Mobility Bed Mobility Overal bed mobility: Needs Assistance Bed Mobility: Supine to Sit;Sit to Supine     Supine to sit: Min assist Sit to supine: Mod assist   General bed mobility comments: pt requires incr time and effort to exit bed on the R side. pt needed help with power up from supine. pt requires (A) for BIL LE and to sequence return to supine  Transfers Overall transfer level: Needs assistance   Transfers: Sit to/from Stand Sit to Stand: +2 physical assistance;Min assist         General transfer comment: pt requires (A) once in standing due to immediate lob to the L side. pt requires total +2 mod to max in static standing. pt with chair positioned for BIL UE to help with center while in static standing    Balance Overall balance assessment: Needs  assistance Sitting-balance support: Bilateral upper extremity supported;Feet supported Sitting balance-Leahy Scale: Poor     Standing balance support: Bilateral upper extremity supported;During functional activity Standing balance-Leahy Scale: Zero Standing balance comment: L side LOB                            ADL Overall ADL's : Needs assistance/impaired   Eating/Feeding Details (indicate cue type and reason): pt reports change in voice quality and question need for SLP Grooming: Wash/dry hands;Min guard   Upper Body Bathing: Minimal assistance   Lower Body Bathing: Maximal assistance Lower Body Bathing Details (indicate cue type and reason): pt requires (A) for balance due to L lean                       General ADL Comments: pt static sitting with strong L lean. pt requires (A) for upright posture. Pt states I can't hold it there     Vision Baseline Vision/History: Wears glasses Wears Glasses: At all times Vision Assessment?: Yes Eye Alignment: Within Functional Limits Ocular Range of Motion: Within Functional Limits Tracking/Visual Pursuits: Able to track stimulus in all quads without difficulty Additional Comments: Pt noted to have slight jump with tracking. pt noted to have redness to eyes and reports several eye doctor appointments recently. pt reports that pain occurred behind the eye. pt reports no change in vision at this time. pt reports L eye closing  and having trouble keeping it open     Perception     Praxis      Pertinent Vitals/Pain Pain Assessment: 0-10 Pain Score: 7  Pain Location: Left eye Pain Intervention(s): Monitored during session;Premedicated before session;Repositioned     Hand Dominance Right   Extremity/Trunk Assessment Upper Extremity Assessment Upper Extremity Assessment: LUE deficits/detail LUE Deficits / Details: drifting toward the L side, 4 out 5 MMT however able to break resistance   Lower Extremity  Assessment Lower Extremity Assessment: Defer to PT evaluation   Cervical / Trunk Assessment Cervical / Trunk Assessment: Normal   Communication Communication Communication: HOH (wearings hearing aide/ sound quality of your voice)   Cognition Arousal/Alertness: Awake/alert Behavior During Therapy: WFL for tasks assessed/performed Overall Cognitive Status: Within Functional Limits for tasks assessed                     General Comments       Exercises       Shoulder Instructions      Home Living Family/patient expects to be discharged to:: Private residence Living Arrangements: Spouse/significant other Available Help at Discharge: Family Type of Home: House Home Access: Stairs to enter Technical brewer of Steps: 7 Entrance Stairs-Rails: Left;Right Home Layout: One level     Bathroom Shower/Tub: Tub/shower unit Shower/tub characteristics: Curtain Biochemist, clinical: Standard     Home Equipment: Environmental consultant - 2 wheels;Cane - quad          Prior Functioning/Environment Level of Independence: Independent                 OT Problem List: Decreased strength;Decreased activity tolerance;Impaired balance (sitting and/or standing);Decreased safety awareness;Decreased knowledge of use of DME or AE;Decreased knowledge of precautions;Pain      OT Treatment/Interventions: Self-care/ADL training;Therapeutic exercise;Neuromuscular education;DME and/or AE instruction;Therapeutic activities;Visual/perceptual remediation/compensation;Patient/family education;Balance training    OT Goals(Current goals can be found in the care plan section) Acute Rehab OT Goals Patient Stated Goal: to get back to being able to sit  OT Goal Formulation: With patient/family Time For Goal Achievement: 07/10/16 Potential to Achieve Goals: Good  OT Frequency: Min 3X/week   Barriers to D/C:            Co-evaluation PT/OT/SLP Co-Evaluation/Treatment: Yes Reason for Co-Treatment: For  patient/therapist safety;Complexity of the patient's impairments (multi-system involvement);To address functional/ADL transfers   OT goals addressed during session: ADL's and self-care;Proper use of Adaptive equipment and DME;Strengthening/ROM      End of Session Equipment Utilized During Treatment: Gait belt Nurse Communication: Mobility status;Precautions  Activity Tolerance: Patient tolerated treatment well Patient left: with call bell/phone within reach;in bed;with family/visitor present  OT Visit Diagnosis: Unsteadiness on feet (R26.81)                ADL either performed or assessed with clinical judgement  Time: 8588-5027 OT Time Calculation (min): 27 min Charges:  OT General Charges $OT Visit: 1 Procedure OT Evaluation $OT Eval Moderate Complexity: 1 Procedure G-Codes: OT G-codes **NOT FOR INPATIENT CLASS** Functional Assessment Tool Used: Clinical judgement Functional Limitation: Self care Self Care Current Status (X4128): At least 60 percent but less than 80 percent impaired, limited or restricted Self Care Goal Status (N8676): At least 20 percent but less than 40 percent impaired, limited or restricted    Jeri Modena   OTR/L Pager: [phone] Office: [phone] .   Parke Poisson B 06/19/2016, 9:59 AM

## 2016-06-19 NOTE — ED Notes (Signed)
Pt being transported upstairs at this time.

## 2016-06-19 NOTE — ED Notes (Signed)
Neurology at bedside.

## 2016-06-19 NOTE — ED Notes (Signed)
Pt taken to US

## 2016-06-19 NOTE — ED Notes (Signed)
While giving pt medication, pt complains of trouble swallowing. Pt originally passed stroke swallow screen. To place order for speech evaluation. Pt. Will be kept NPO

## 2016-06-19 NOTE — Evaluation (Signed)
Physical Therapy Evaluation Patient Details Name: [name] MRN: [id number] DOB: 03-Sep-1945 Today's Date: 06/19/2016   History of Present Illness  71 yo male admitted with single focus of remote microhemorrhage in the L Pon, abnormal signal within V4 segment of L vertebral artery, faint focus of reduced diffusion within L medulla concerning for small infarct  Clinical Impression  Pt admitted with above. Pt with L sided weakness and significant L lateral lean in sitting and standing requiring assistx2 for safe mobility. Pt was indep PTA. Pt to strongly benefit from CIR upon d/c to address mentioned deficits and achieve safe supervision level of function for safe transition home with spouse.    Follow Up Recommendations CIR;Supervision/Assistance - 24 hour    Equipment Recommendations   (TBD by next venue)    Recommendations for Other Services Rehab consult     Precautions / Restrictions Precautions Precautions: Fall Precaution Comments: listing to the L Restrictions Weight Bearing Restrictions: No      Mobility  Bed Mobility Overal bed mobility: Needs Assistance Bed Mobility: Supine to Sit;Sit to Supine     Supine to sit: Min assist Sit to supine: Mod assist   General bed mobility comments: pt requires incr time and effort to exit bed on the R side. pt needed help with power up from supine. pt requires (A) for BIL LE and to sequence return to supine  Transfers Overall transfer level: Needs assistance Equipment used: 2 person hand held assist Transfers: Sit to/from Stand Sit to Stand: +2 physical assistance;Mod assist         General transfer comment: pt requires (A) once in standing due to immediate lob to the L side. pt requires total +2 mod to max in static standing. pt with chair positioned for BIL UE to help with center while in static standing  Ambulation/Gait Ambulation/Gait assistance: Mod assist;+2 physical assistance Ambulation Distance (Feet):  (3  side steps to [facility]) Assistive device: 2 person hand held assist   Gait velocity: slow Gait velocity interpretation: Below normal speed for age/gender General Gait Details: limited to side stepping to HOB, pt with strong lean to the L, dificulty with raising L LE and impaired proprioception  Stairs            Wheelchair Mobility    Modified Rankin (Stroke Patients Only) Modified Rankin (Stroke Patients Only) Pre-Morbid Rankin Score: No symptoms Modified Rankin: No significant disability     Balance Overall balance assessment: Needs assistance Sitting-balance support: Bilateral upper extremity supported;Feet supported Sitting balance-Leahy Scale: Poor     Standing balance support: Bilateral upper extremity supported;During functional activity Standing balance-Leahy Scale: Zero Standing balance comment: L side LOB                             Pertinent Vitals/Pain Pain Assessment: 0-10 Pain Score: 7  Pain Location: Left eye Pain Intervention(s): Monitored during session    Home Living Family/patient expects to be discharged to:: Private residence Living Arrangements: Spouse/significant other Available Help at Discharge: Family Type of Home: House Home Access: Stairs to enter Entrance Stairs-Rails: Chemical engineer of Steps: 7 Home Layout: One level Home Equipment: Environmental consultant - 2 wheels;Cane - quad      Prior Function Level of Independence: Independent               Hand Dominance   Dominant Hand: Right    Extremity/Trunk Assessment   Upper Extremity Assessment Upper Extremity Assessment:  Defer to OT evaluation LUE Deficits / Details: drifting toward the L side, 4 out 5 MMT however able to break resistance    Lower Extremity Assessment Lower Extremity Assessment: LLE deficits/detail LLE Deficits / Details: mild weakness compared to R LLE Sensation: history of peripheral neuropathy (in bilat LE)    Cervical / Trunk  Assessment Cervical / Trunk Assessment: Normal  Communication   Communication: HOH (wearings hearing aide/ sound quality of your voice)  Cognition Arousal/Alertness: Awake/alert Behavior During Therapy: WFL for tasks assessed/performed Overall Cognitive Status: Within Functional Limits for tasks assessed                      General Comments      Exercises     Assessment/Plan    PT Assessment Patient needs continued PT services  PT Problem List Decreased strength;Decreased activity tolerance;Decreased balance;Decreased mobility;Decreased coordination;Decreased knowledge of use of DME;Decreased safety awareness;Decreased knowledge of precautions       PT Treatment Interventions DME instruction;Gait training;Stair training;Functional mobility training;Therapeutic activities;Therapeutic exercise;Balance training    PT Goals (Current goals can be found in the Care Plan section)  Acute Rehab PT Goals Patient Stated Goal: to get back to being able to sit  PT Goal Formulation: With patient/family Time For Goal Achievement: 07/03/16 Potential to Achieve Goals: Good    Frequency Min 4X/week   Barriers to discharge        Co-evaluation PT/OT/SLP Co-Evaluation/Treatment: Yes Reason for Co-Treatment: Complexity of the patient's impairments (multi-system involvement);For patient/therapist safety PT goals addressed during session: Mobility/safety with mobility OT goals addressed during session: ADL's and self-care;Proper use of Adaptive equipment and DME;Strengthening/ROM       End of Session Equipment Utilized During Treatment: Gait belt Activity Tolerance: Patient tolerated treatment well Patient left: in chair;with call bell/phone within reach;with family/visitor present Nurse Communication: Mobility status PT Visit Diagnosis: Difficulty in walking, not elsewhere classified (R26.2);History of falling (Z91.81)    Functional Assessment Tool Used: Clinical  judgement Functional Limitation: Mobility: Walking and moving around Mobility: Walking and Moving Around Current Status (W3888): At least 60 percent but less than 80 percent impaired, limited or restricted Mobility: Walking and Moving Around Goal Status [phone]): At least 1 percent but less than 20 percent impaired, limited or restricted    Time: 0746-0815 PT Time Calculation (min) (ACUTE ONLY): 29 min   Charges:   PT Evaluation $PT Eval Moderate Complexity: 1 Procedure     PT G Codes:   PT G-Codes **NOT FOR INPATIENT CLASS** Functional Assessment Tool Used: Clinical judgement Functional Limitation: Mobility: Walking and moving around Mobility: Walking and Moving Around Current Status (K9179): At least 60 percent but less than 80 percent impaired, limited or restricted Mobility: Walking and Moving Around Goal Status [phone]): At least 1 percent but less than 20 percent impaired, limited or restricted     Berline Lopes 06/19/2016, 10:35 AM   Kittie Plater, PT, DPT Pager #: [phone] Office #: [phone]

## 2016-06-19 NOTE — H&P (Signed)
History and Physical  Patient Name: [name]     WEX:[id number]    DOB: January 09, 1946    DOA: 06/19/2016 PCP: [name], MD   Patient coming from: Home     Chief Complaint: Headache  HPI: [name] is a 71 y.o. male with a past medical history significant for Afib and PE on Eliquis, HFpEF, HTN, hx of pericardial effusion s/p window, and hx of OSA on CPAP who presents with acute headache and ataxia.  The patient was in his usual state of health until tonight, around midnight, he was watching television, when he had sudden onset of moderate to severe left-sided retro-orbital headache. He stood up, but felt like he was "leaning or staggering to the left and forward". He walked to the bathroom, but felt like he had to hold onto things to get there, while sitting in the bathroom, his wife called 911.  ED course: -Afebrile, heart rate 56, respiration pulse ox normal, blood pressure 190/100 -Na 139, K 3.8, Cr 1.07, WBC 10K, Hgb 14.6 -Coags normal -Troponin negative -CT of the head was unremarkable -CT angiogram of the head and neck showed possible left vertebral dissection vs occlusion vs poor contrast timing -He was not a candidate for tPA given Eliquis and so [facility] were asked to admit for observation for possible stroke     Review of systems:  Review of Systems  Eyes: Positive for blurred vision (left eye).  Neurological: Positive for dizziness (ataxia) and headaches. Negative for tingling, tremors, sensory change, speech change, focal weakness, seizures and loss of consciousness.  All other systems reviewed and are negative.        Past Medical History:  Diagnosis Date  . Anemia 07/29/2013  . Asthma    childhood asthma  . Benign prostatic hypertrophy    several prostate biopsies neg for cancer.   . Bilateral hip pain   . Cancer ([place])   . CHF (congestive heart failure) ([place])   . Complication of anesthesia    MORPHINE CAUSES HALLUCINATIONS  . Depression   .  FH: colonic polyps 2010  . Gout   . History of kidney stones   . Hypertension   . Kidney cysts   . Morbid obesity ([place])   . Numbness    feet / legs  . PAF (paroxysmal atrial fibrillation) with RVR 07/29/2013  . Sleep apnea    USES C PAP    Past Surgical History:  Procedure Laterality Date  . ARTHROSCOPY KNEE W/ DRILLING    . BREATH TEK H PYLORI N/A 12/23/2012   Procedure: BREATH TEK H PYLORI;  Surgeon: Gayland Curry, MD;  Location: [facility];  Service: General;  Laterality: N/A;  . CHOLECYSTECTOMY  1989  . COLONOSCOPY N/A 07/31/2013   Procedure: COLONOSCOPY;  Surgeon: Gatha Mayer, MD;  Location: [place];  Service: Endoscopy;  Laterality: N/A;  . ESOPHAGOGASTRODUODENOSCOPY N/A 07/22/2013   Procedure: ESOPHAGOGASTRODUODENOSCOPY (EGD);  Surgeon: Irene Shipper, MD;  Location: [facility] ENDOSCOPY;  Service: Endoscopy;  Laterality: N/A;  . ESOPHAGOGASTRODUODENOSCOPY N/A 07/31/2013   Procedure: ESOPHAGOGASTRODUODENOSCOPY (EGD);  Surgeon: Gatha Mayer, MD;  Location: [facility] ENDOSCOPY;  Service: Endoscopy;  Laterality: N/A;  . INTRAOPERATIVE TRANSESOPHAGEAL ECHOCARDIOGRAM N/A 07/18/2013   Procedure: INTRAOPERATIVE TRANSESOPHAGEAL ECHOCARDIOGRAM;  Surgeon: Grace Isaac, MD;  Location: [place];  Service: Open Heart Surgery;  Laterality: N/A;  . LAPAROSCOPIC GASTRIC BANDING WITH HIATAL HERNIA REPAIR N/A 04/08/2013   Procedure: LAPAROSCOPIC GASTRIC BANDING WITH possible  HIATAL HERNIA REPAIR;  Surgeon: Gayland Curry, MD;  Location: [facility];  Service: General;  Laterality: N/A;  . LITHOTRIPSY    . renal calculi    . SUBXYPHOID PERICARDIAL WINDOW N/A 07/18/2013   Procedure: SUBXYPHOID PERICARDIAL WINDOW;  Surgeon: Grace Isaac, MD;  Location: [place];  Service: Thoracic;  Laterality: N/A;  . TONSILLECTOMY    . total knee replacment     bilat  . trigger finger repair     also lue, cts surgically repaired  . WISDOM TOOTH EXTRACTION      Social History: Patient lives with his wife.  Patient walks  unassisted. He is from [place].  He does not smoke.  He is a retired Chief Financial Officer.    Allergies  Allergen Reactions  . Morphine Other (See Comments)    Nausea and Severe hallucinations  . Codeine Nausea Only  . Penicillins Other (See Comments)    LOC with shot  . Propoxyphene N-Acetaminophen Nausea Only    Family history: family history includes Depression in his brother and mother; Diabetes in his cousin and paternal uncle; Heart disease in his father, paternal grandmother, and paternal uncle; Hypertension in his mother; Stroke in his paternal aunt.  Prior to Admission medications   Medication Sig Start Date End Date Taking? Authorizing Provider  amLODipine-benazepril (LOTREL) 5-10 MG capsule Take 1 capsule by mouth daily. 05/23/16  Yes Pixie Casino, MD  apixaban (ELIQUIS) 5 MG TABS tablet Take 1 tablet (5 mg total) by mouth 2 (two) times daily. 10/21/15  Yes Pixie Casino, MD  aspirin 81 MG tablet Take 81 mg by mouth 2 (two) times daily.    Yes Historical Provider, MD  atorvastatin (LIPITOR) 40 MG tablet Take 1 tablet (40 mg total) by mouth daily. 04/22/16  Yes Janith Lima, MD  budesonide-formoterol [facility]) 160-4.5 MCG/ACT inhaler Inhale 2 puffs into the lungs 2 (two) times daily as needed (shortness of breath).   Yes Historical Provider, MD  CALCIUM PO Take 500 mg by mouth 3 (three) times daily.    Yes Historical Provider, MD  Cyanocobalamin (VITAMIN B-12) 1000 MCG SUBL Place 1,000 mcg under the tongue every evening.    Yes Historical Provider, MD  Docusate Sodium (DSS) 100 MG CAPS Take 100 mg by mouth every other day. 07/25/13  Yes Kelvin Cellar, MD  finasteride (PROSCAR) 5 MG tablet Take 5 mg by mouth daily.    Yes Historical Provider, MD  flecainide (TAMBOCOR) 50 MG tablet TAKE ONE TABLET BY MOUTH EVERY 12 HOURS 10/01/15  Yes Pixie Casino, MD  furosemide (LASIX) 20 MG tablet TAKE 1 TAB ONCE A DAY AS NEEDED FOR SWELLING IN THE LEGS Patient taking differently: TAKE  1 TAB ONCE A DAY FOR SWELLING IN THE LEGS 02/06/16  Yes Janith Lima, MD  loratadine (CLARITIN) 10 MG tablet Take 10 mg by mouth daily.   Yes Historical Provider, MD  LORazepam (ATIVAN) 0.5 MG tablet TAKE ONE TABLET BY MOUTH ONCE DAILY IN THE MORNING AND TWO AT BEDTIME 05/30/16  Yes Kathlee Nations, MD  montelukast (SINGULAIR) 10 MG tablet TAKE 1 TABLET BY MOUTH ONCE A DAY AT BEDTIME 06/16/16  Yes Biagio Borg, MD  naphazoline-pheniramine (NAPHCON-A) 0.025-0.3 % ophthalmic solution Place 1 drop into both eyes 4 (four) times daily as needed for irritation.   Yes Historical Provider, MD  pantoprazole (PROTONIX) 40 MG tablet Take 1 tablet (40 mg total) by mouth 2 (two) times daily. 04/22/16  Yes Janith Lima, MD  risperiDONE (RISPERDAL) 0.5 MG tablet Take 1 tablet (0.5 mg total) by mouth at bedtime. 05/30/16  Yes Kathlee Nations, MD  rOPINIRole (REQUIP) 0.25 MG tablet TAKE THREE TABLETS BY MOUTH AT BEDTIME OR  AS  DIRECTED  FOR  LEG  JERKS 09/16/15  Yes Deneise Lever, MD  sertraline (ZOLOFT) 100 MG tablet TAKE ONE AND ONE-HALF TABLETS BY MOUTH ONCE DAILY WITH  BREAKFAST 05/30/16  Yes Kathlee Nations, MD     Physical Exam: BP (!) 182/100   Pulse 63   Temp 98.5 F (36.9 C) (Oral)   Resp 17   Wt (S) (!) 162.4 kg (358 lb 0.4 oz)   SpO2 98%   BMI 48.56 kg/m  General appearance: Well-developed, obese adult male, alert and in no acute distress.   Eyes: Anicteric, conjunctiva inflamed, lids and lashes normal. PERRL.    ENT: No nasal deformity, discharge, epistaxis.  Hearing somewhat diminshed. OP moist without lesions.   Dentition normal. Lymph: No cervical, supraclavicular or axillary lymphadenopathy. Skin: Warm and dry.  No jaundice.  No suspicious rashes or lesions. Cardiac: RRR, nl S1-S2, no murmurs appreciated.  Capillary refill is brisk.  JVP not visible.  Mild nonpitting LE edema.  Radial and DP pulses 2+ and symmetric.  No carotid bruits. Respiratory: Normal respiratory rate and rhythm.  CTAB  without rales or wheezes. GI: Abdomen soft without rigidity.  No TTP. No ascites, distension, no hepatosplenomegaly.   MSK: No deformities or effusions. Neuro: Pupils are 4 mm and reactive to 3 mm. Extraocular movements are intact, without nystagmus. Cranial nerve 5 is within normal limits. Cranial nerve 7 is symmetrical. Cranial nerve 8 is within normal limits. Cranial nerves 9 and 10 reveal equal palate elevation. Cranial nerve 11 reveals sternocleidomastoid strong. Cranial nerve 12 is midline. I do not note a deficit in motor strength testing in the upper and lower extremities bilaterally with normal motor, tone and bulk. Romberg maneuver is negative for pathology. Finger-to-nose testing is within normal limits. Speech is fluent. Naming is grossly intact. Attention span and concentration are within normal limits.   Psych: The patient is oriented to time, place and person. Behavior appropriate.  Affect normal.  Recall, recent and remote, as well as general fund of knowledge seem within normal limits. No evidence of aural or visual hallucinations or delusions.       Labs on Admission:  I have personally reviewed following labs and imaging studies: CBC:  Recent Labs Lab 06/19/16 0223 06/19/16 0233  WBC 10.0  --   NEUTROABS 6.1  --   HGB 14.6 14.6  HCT 44.4 43.0  MCV 87.7  --   PLT 183  --    Basic Metabolic Panel:  Recent Labs Lab 06/19/16 0223 06/19/16 0233  NA 139 141  K 3.8 3.2*  CL 104 104  CO2 24  --   GLUCOSE 113* 115*  BUN 14 15  CREATININE 1.07 1.00  CALCIUM 8.2*  --    GFR: Estimated Creatinine Clearance: 108.4 mL/min (by C-G formula based on SCr of 1 mg/dL). Liver Function Tests:  Recent Labs Lab 06/19/16 0223  AST 33  ALT 21  ALKPHOS 86  BILITOT 0.9  PROT 5.9*  ALBUMIN 3.3*   No results for input(s): LIPASE, AMYLASE in the last 168 hours. No results for input(s): AMMONIA in the last 168 hours. Coagulation Profile:  Recent Labs Lab  06/19/16 0223  INR 1.09   Cardiac Enzymes: No results for input(s): CKTOTAL, CKMB, CKMBINDEX,  TROPONINI in the last 168 hours. BNP (last 3 results) No results for input(s): PROBNP in the last 8760 hours. HbA1C: No results for input(s): HGBA1C in the last 72 hours. CBG:  Recent Labs Lab 06/19/16 0225  GLUCAP 105*   Lipid Profile: No results for input(s): CHOL, HDL, LDLCALC, TRIG, CHOLHDL, LDLDIRECT in the last 72 hours. Thyroid Function Tests: No results for input(s): TSH, T4TOTAL, FREET4, T3FREE, THYROIDAB in the last 72 hours. Anemia Panel: No results for input(s): VITAMINB12, FOLATE, FERRITIN, TIBC, IRON, RETICCTPCT in the last 72 hours. Sepsis Labs: Invalid input(s): PROCALCITONIN, LACTICIDVEN No results found for this or any previous visit (from the past 240 hour(s)).    Radiological Exams on Admission: Personally reviewed CT head and CTA head/neck reports: Ct Angio Head W Or Wo Contrast  Result Date: 06/19/2016 CLINICAL DATA:  Left-sided weakness.  Concern for dissection. EXAM: CT ANGIOGRAPHY HEAD AND NECK TECHNIQUE: Multidetector CT imaging of the head and neck was performed using the standard protocol during bolus administration of intravenous contrast. Multiplanar CT image reconstructions and MIPs were obtained to evaluate the vascular anatomy. Carotid stenosis measurements (when applicable) are obtained utilizing NASCET criteria, using the distal internal carotid diameter as the denominator. CONTRAST:  50 mL Isovue 370 IV COMPARISON:  Head CT 06/19/2016 FINDINGS: The examination is degraded by poor contrast bolus. CTA NECK FINDINGS Aortic arch: There is no aneurysm or dissection of the visualized ascending aorta or aortic arch. There is an average right subclavian artery. The brachiocephalic and left common carotid arteries share a common origin. The visualized proximal subclavian arteries are normal. There is calcific aortic atherosclerosis. Right carotid system: There is no  common carotid or internal carotid artery dissection or aneurysm. No hemodynamically significant stenosis. Left carotid system: There is no common carotid or internal carotid artery dissection or aneurysm. Minimal bifurcation atherosclerosis without hemodynamically significant stenosis. Vertebral arteries: The vertebral system is left dominant. Assessment of the vertebral artery origins and proximal V2 segments is severely degraded by poor signal to noise ratio. There is abrupt termination of the left vertebral artery at the junction of the V3 and V4 segments, at the skullbase (sagittal images 166). Skeleton: There is no bony spinal canal stenosis. No lytic or blastic lesions. Other neck: The nasopharynx is clear. The oropharynx and hypopharynx are normal. The epiglottis is normal. The supraglottic larynx, glottis and subglottic larynx are normal. No retropharyngeal collection. The parapharyngeal spaces are preserved. The parotid and submandibular glands are normal. No sialolithiasis or salivary ductal dilatation. The thyroid gland is normal. There is no cervical lymphadenopathy. Upper chest: No pneumothorax or pleural effusion. No nodules or masses. Review of the MIP images confirms the above findings CTA HEAD FINDINGS Anterior circulation: --Intracranial internal carotid arteries: Normal. --Anterior cerebral arteries: Normal. --Middle cerebral arteries: Normal proximally. The poor contrast bolus degrades assessment of the distal circulation. --Posterior communicating arteries: Present on the right. Posterior circulation: --Posterior cerebral arteries: Normal proximally. Poor contrast bolus degrades distal assessment. --Superior cerebellar arteries: Normal on the right. Not clearly visualized on the left. --Basilar artery: Normal. --Anterior inferior cerebellar arteries: Normal. --Posterior inferior cerebellar arteries: Normal on the right. Poorly opacified on the left. Venous sinuses: As permitted by contrast  timing, patent. Anatomic variants: None Delayed phase: No parenchymal contrast enhancement. Review of the MIP images confirms the above findings IMPRESSION: 1. Examination degraded by poor contrast bolus. 2. 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 This suggests acute dissection, though occlusive thrombus could cause the  same appearance. 3. No proximal occlusion of the intracranial arteries. 4. No hemodynamically significant stenosis or dissection of the carotid arteries. These results were called by telephone at the time of interpretation on 06/19/2016 at 3:15 am to Dr. Roland Rack , who verbally acknowledged these results. Electronically Signed   By: Ulyses Jarred M.D.   On: 06/19/2016 03:25   Ct Angio Neck W Or Wo Contrast  Result Date: 06/19/2016 CLINICAL DATA:  Left-sided weakness.  Concern for dissection. EXAM: CT ANGIOGRAPHY HEAD AND NECK TECHNIQUE: Multidetector CT imaging of the head and neck was performed using the standard protocol during bolus administration of intravenous contrast. Multiplanar CT image reconstructions and MIPs were obtained to evaluate the vascular anatomy. Carotid stenosis measurements (when applicable) are obtained utilizing NASCET criteria, using the distal internal carotid diameter as the denominator. CONTRAST:  50 mL Isovue 370 IV COMPARISON:  Head CT 06/19/2016 FINDINGS: The examination is degraded by poor contrast bolus. CTA NECK FINDINGS Aortic arch: There is no aneurysm or dissection of the visualized ascending aorta or aortic arch. There is an average right subclavian artery. The brachiocephalic and left common carotid arteries share a common origin. The visualized proximal subclavian arteries are normal. There is calcific aortic atherosclerosis. Right carotid system: There is no common carotid or internal carotid artery dissection or aneurysm. No hemodynamically significant stenosis. Left carotid system: There is no common carotid or  internal carotid artery dissection or aneurysm. Minimal bifurcation atherosclerosis without hemodynamically significant stenosis. Vertebral arteries: The vertebral system is left dominant. Assessment of the vertebral artery origins and proximal V2 segments is severely degraded by poor signal to noise ratio. There is abrupt termination of the left vertebral artery at the junction of the V3 and V4 segments, at the skullbase (sagittal images 166). Skeleton: There is no bony spinal canal stenosis. No lytic or blastic lesions. Other neck: The nasopharynx is clear. The oropharynx and hypopharynx are normal. The epiglottis is normal. The supraglottic larynx, glottis and subglottic larynx are normal. No retropharyngeal collection. The parapharyngeal spaces are preserved. The parotid and submandibular glands are normal. No sialolithiasis or salivary ductal dilatation. The thyroid gland is normal. There is no cervical lymphadenopathy. Upper chest: No pneumothorax or pleural effusion. No nodules or masses. Review of the MIP images confirms the above findings CTA HEAD FINDINGS Anterior circulation: --Intracranial internal carotid arteries: Normal. --Anterior cerebral arteries: Normal. --Middle cerebral arteries: Normal proximally. The poor contrast bolus degrades assessment of the distal circulation. --Posterior communicating arteries: Present on the right. Posterior circulation: --Posterior cerebral arteries: Normal proximally. Poor contrast bolus degrades distal assessment. --Superior cerebellar arteries: Normal on the right. Not clearly visualized on the left. --Basilar artery: Normal. --Anterior inferior cerebellar arteries: Normal. --Posterior inferior cerebellar arteries: Normal on the right. Poorly opacified on the left. Venous sinuses: As permitted by contrast timing, patent. Anatomic variants: None Delayed phase: No parenchymal contrast enhancement. Review of the MIP images confirms the above findings IMPRESSION: 1.  Examination degraded by poor contrast bolus. 2. 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 This suggests acute dissection, though occlusive thrombus could cause the same appearance. 3. No proximal occlusion of the intracranial arteries. 4. No hemodynamically significant stenosis or dissection of the carotid arteries. These results were called by telephone at the time of interpretation on 06/19/2016 at 3:15 am to Dr. Roland Rack , who verbally acknowledged these results. Electronically Signed   By: Ulyses Jarred M.D.   On: 06/19/2016 03:25   Ct Head Code  Stroke W/o Cm  Result Date: 06/19/2016 CLINICAL DATA:  Code stroke.  Left-sided weakness EXAM: CT HEAD WITHOUT CONTRAST TECHNIQUE: Contiguous axial images were obtained from the base of the skull through the vertex without intravenous contrast. COMPARISON:  None. FINDINGS: Brain: No mass lesion, intraparenchymal hemorrhage or extra-axial collection. No evidence of acute cortical infarct. Brain parenchyma and CSF-containing spaces are normal for age. Vascular: No hyperdense vessel or unexpected calcification. Skull: Normal visualized skull base, calvarium and extracranial soft tissues. Sinuses/Orbits: No sinus fluid levels or advanced mucosal thickening. No mastoid effusion. Normal orbits. ASPECTS [facility] Stroke Program Early CT Score) - Ganglionic level infarction (caudate, lentiform nuclei, internal capsule, insula, M1-M3 cortex): 7 - Supraganglionic infarction (M4-M6 cortex): 3 Total score (0-10 with 10 being normal): 10 IMPRESSION: 1. No acute intracranial abnormality. 2. ASPECTS is 10. These results were called by telephone at the time of interpretation on 06/19/2016 at 2:42 am to Dr. Roland Rack, who verbally acknowledged these results. Electronically Signed   By: Ulyses Jarred M.D.   On: 06/19/2016 02:42     EKG: Independently reviewed. Rate 62, flat T waves, QTc normal.  Echocardiogram 2017 report  reviewed: EF 60-65%     Assessment/Plan  1. Acute vert dissection vs stroke vs TIA:  Patient not tPA candidate.  If dissection is present, patient is already anticoagulated.  This is new.  MRI pending.  ABCD 4.  SAH is doubted. -Admit to telemetry -Neuro checks, NIHSS per protocol -Continue eliquis and baby aspirin -Lipids, hemoglobin A1c -Carotid doppler ordered -Echocardiogram ordered -PT/OT/SLP consultation -Consult to Neurology, appreciate recommendations   2. Hypertension:  -Continue amlodipine-benazepril -Continue statin, aspirin  3. Chronic diastolic CHF: EF 56%  -Continue furosemide, ACEi  4. Atrial fibrillation:  CHADS2Vasc 3.  On Eliquis and flecainide. -Continue Eliquis -Continue flecainide  5. Other medications:  -Continue lorazepam BID -Continue Proscar -Continue sertraline -Continue ropinirole -Continue Risperdal -Continue PPI -Continue Singulair       DVT prophylaxis: N/A  Code Status: FULL  Family Communication: Wife at bedside  Disposition Plan: Anticipate Stroke work up as above and consult to ancillary services.  Expect discharge within 1-2 days. Consults called: Neurology, Dr. Leonel Ramsay has seen patient. Admission status: Telemetry, OBS status  Core measures: -VTE prophylaxis ordered at time of admission -Aspirin ordered at admission -Atrial fibrillation: known and anticoagulated -tPA not given because of using NOAC -Dysphagia screen ordered in ER -Lipids ordered -PT eval ordered -Nonsmoker    Medical decision making: Patient seen at 4:40 AM on 06/19/2016.  The patient was discussed with Dr. Kathrynn Humble. What exists of the patient's chart was reviewed in depth and summarized above.  Clinical condition: stable.       Edwin Dada [facility] Pager [phone]

## 2016-06-19 NOTE — Progress Notes (Signed)
Pt arrived to 5M15 via stretcher.  Pt alert and oriented in no apparent distress.  No complaints of pain at this time, complaints of light sensitivity, and nausea and dizziness with movement.  Telemetry applied and CCMD notified.  Will continue to monitor.  Cori Razor, RN

## 2016-06-19 NOTE — Progress Notes (Signed)
Patient refusing CPAP, pt has home machine but states he's in too much pain.

## 2016-06-19 NOTE — ED Notes (Signed)
Pt to MRI via stretcher.

## 2016-06-19 NOTE — ED Notes (Signed)
Patient taking Eliquis

## 2016-06-19 NOTE — Progress Notes (Signed)
Patient had a normal carotid CT angio today. Please advise if carotid duplex is still needed.  Oda Cogan, BS, RDMS, RVT

## 2016-06-19 NOTE — Progress Notes (Signed)
Rehab Admissions Coordinator Note:  Patient was screened by Retta Diones for appropriateness for an Inpatient Acute Rehab Consult.  At this time, we are recommending Inpatient Rehab consult.  Jodell Cipro M 06/19/2016, 1:00 PM  I can be reached at [phone].

## 2016-06-19 NOTE — ED Notes (Signed)
Paged MD Leonie Man paged about patient's symptoms

## 2016-06-19 NOTE — Progress Notes (Signed)
  Echocardiogram 2D Echocardiogram has been performed.  Jennette Dubin 06/19/2016, 4:43 PM

## 2016-06-19 NOTE — ED Notes (Signed)
Called SLP to evaluate patient.

## 2016-06-19 NOTE — Progress Notes (Signed)
[name] is a 71 year old male come to ED from home via [facility]. Pt reports 0130 he was lying in bed when he had a sudden onset of left side head pain. He also noted when he tried to sit up and walk he would lean to the left. NIH 0, CBG 105, LKW 0130. Pt will be admitted for a TIA work up.

## 2016-06-19 NOTE — ED Triage Notes (Addendum)
Patient comes by EMS for sudden headache and unsteady LEFT sided gait.  Activated code stroke per EMS.  Last BP for EMS was 190/100.  A&Ox4 at this time

## 2016-06-19 NOTE — ED Notes (Signed)
Spoke with MD Wallace Keller about patient's SLP evaluation. Paged Speech to get patient evaluated.

## 2016-06-19 NOTE — Consult Note (Signed)
Neurology Consultation Reason for Consult: Transient left-sided weakness Referring Physician: Kathrynn Humble, A  CC: Transient leaning to the left  History is obtained from: Patient  HPI: [name] is a 71 y.o. male who was in his normal state of health until around 1:30 AM. He was in bed watching a movie when he had sudden onset left-sided head pain. He states that he then tried to stand up and found that he was leaning to the left. 911 was called and he was brought in as a code stroke. On arrival, his NIH was 0 any states that he felt much better than he had, though he still does have pain.   LKW: 1:30 AM tpa given?: no, resolved symptoms   ROS: A 14 point ROS was performed and is negative except as noted in the HPI.  Unable to obtain due to altered mental status.   Past Medical History:  Diagnosis Date  . Anemia 07/29/2013  . Asthma    childhood asthma  . Benign prostatic hypertrophy    several prostate biopsies neg for cancer.   . Bilateral hip pain   . Cancer ([place])   . CHF (congestive heart failure) ([place])   . Complication of anesthesia    MORPHINE CAUSES HALLUCINATIONS  . Depression   . FH: colonic polyps 2010  . Gout   . History of kidney stones   . Hypertension   . Kidney cysts   . Morbid obesity ([place])   . Numbness    feet / legs  . PAF (paroxysmal atrial fibrillation) with RVR 07/29/2013  . Sleep apnea    USES C PAP     Family History  Problem Relation Age of Onset  . Depression Mother   . Hypertension Mother   . Heart disease Father     MI  . Depression Brother   . Stroke Paternal Aunt     CVA  . Diabetes Paternal Uncle   . Heart disease Paternal Uncle     MI  . Heart disease Paternal Grandmother     MI  . Diabetes Cousin     PATERNAL     Social History:  reports that he quit smoking about 41 years ago. His smoking use included Cigarettes. He started smoking about 55 years ago. He has a 2.60 pack-year smoking history. He has never used smokeless  tobacco. He reports that he drinks alcohol. He reports that he does not use drugs.   Exam: Current vital signs: BP (!) 182/100   Pulse 63   Temp 98.5 F (36.9 C) (Oral)   Resp 17   Wt (S) (!) 162.4 kg (358 lb 0.4 oz)   SpO2 98%   BMI 48.56 kg/m  Vital signs in last 24 hours: Temp:  [98.5 F (36.9 C)] 98.5 F (36.9 C) (03/19 0243) Pulse Rate:  [56-63] 63 (03/19 0300) Resp:  [17-18] 17 (03/19 0300) BP: (146-182)/(65-100) 182/100 (03/19 0300) SpO2:  [95 %-98 %] 98 % (03/19 0300) Weight:  [162.4 kg (358 lb 0.4 oz)] 162.4 kg (358 lb 0.4 oz) (03/19 0239)   Physical Exam  Constitutional: Appears morbidly obese Psych: Affect appropriate to situation Eyes: No scleral injection HENT: No OP obstrucion Head: Normocephalic.  Cardiovascular: Normal rate and regular rhythm.  Respiratory: Effort normal and breath sounds normal to anterior ascultation GI: Soft.  No distension. There is no tenderness.  Skin: WDI  Neuro: Mental Status: Patient is awake, alert, oriented to person, place, month, year, and situation. Patient is able  to give a clear and coherent history. No signs of aphasia or neglect Cranial Nerves: II: Visual Fields are full. Pupils are equal, round, and reactive to light.   III,IV, VI: EOMI without ptosis or diploplia.  V: Facial sensation is symmetric to temperature VII: Facial movement is symmetric.  VIII: hearing is intact to voice X: Uvula elevates symmetrically XI: Shoulder shrug is symmetric. XII: tongue is midline without atrophy or fasciculations.  Motor: Tone is normal. Bulk is normal. 5/5 strength was present in all four extremities.  Sensory: Sensation is symmetric to light touch and temperature in the arms and legs. Cerebellar: FNF and HKS are intact bilaterally  I have reviewed labs in epic and the results pertinent to this consultation are: Chem 8 - unremarkable.   I have reviewed the images obtained:CT head - negative, CTA head- occluded left  vertebral.   Impression: 71 year old male with acute onset left-sided head pain and unsteadiness. The location of his head pain is atypical for vertebral dissection, but could be related. He is artery on anticoagulation and I would continue this. I also favor other workup in case this does not actually represent dissection, but rather TIA from other cause.  Recommendations: 1. HgbA1c, fasting lipid panel 2. MRI of the brain without contrast 3. Frequent neuro checks 4. Echocardiogram 5. Carotid dopplers 6. Prophylactic therapy-Eliquis for anticoagulation 7. Risk factor modification 8. Telemetry monitoring 9. PT consult, OT consult, Speech consult 10. please page stroke NP  Or  PA  Or MD  from 8am -4 pm starting 3/19 as this patient will be followed by the stroke team at this point.   You can look them up on [email or web address]      Roland Rack, MD [facility] [phone]  If 7pm- 7am, please page neurology on call as listed in [place].

## 2016-06-19 NOTE — ED Notes (Signed)
MD Leonie Man at the bedside

## 2016-06-19 NOTE — ED Notes (Signed)
Spoke with MD Candiss Norse. Reported to get a stat SLP evaluation and keep pt NPO except BP meds crushed in applesauce.

## 2016-06-19 NOTE — Progress Notes (Signed)
STROKE TEAM PROGRESS NOTE   SUBJECTIVE (INTERVAL HISTORY) His wife and son are at the bedside.  RN called with neuro worsening this am - increased double vision, nausea and balance difficulty. Went to ED to see patient as he does not yet have a bed. Put on bedrest, gave fluid bolus. Will talk with DR. Deveshwar to see if he is an IR candidate.   OBJECTIVE Temp:  [97.6 F (36.4 C)-98.5 F (36.9 C)] 97.8 F (36.6 C) (03/19 0900) Pulse Rate:  [55-67] 63 (03/19 1030) Resp:  [11-18] 13 (03/19 1030) BP: (130-182)/(62-100) 130/62 (03/19 1030) SpO2:  [95 %-100 %] 97 % (03/19 1030) FiO2 (%):  [21 %] 21 % (03/19 0826) Weight:  [162.4 kg (358 lb 0.4 oz)] 162.4 kg (358 lb 0.4 oz) (03/19 0239)  CBC:  Recent Labs Lab 06/19/16 0223 06/19/16 0233  WBC 10.0  --   NEUTROABS 6.1  --   HGB 14.6 14.6  HCT 44.4 43.0  MCV 87.7  --   PLT 183  --     Basic Metabolic Panel:  Recent Labs Lab 06/19/16 0223 06/19/16 0233  NA 139 141  K 3.8 3.2*  CL 104 104  CO2 24  --   GLUCOSE 113* 115*  BUN 14 15  CREATININE 1.07 1.00  CALCIUM 8.2*  --    HgbA1c:  Lab Results  Component Value Date   HGBA1C 5.6 10/20/2015   Urine Drug Screen: No results found for: LABOPIA, COCAINSCRNUR, LABBENZ, AMPHETMU, THCU, [place] elderly Caucasian male currently not in distress . Afebrile. Head is nontraumatic. Neck is supple without bruit.    Cardiac exam no murmur or gallop. Lungs are clear to auscultation. Distal pulses are well felt.. Neurological Exam :  Awake alert oriented x3. Mild dysarthria. No aphasia.fundi not visualized. Patient has photophobia due to severe vertigo and can barely keep his eyes open. Pupils equal reactive. Face is symmetric without weakness. Tongue is midline. Motor system exam shows no extremity drift. No focal weakness. Finger-to-nose coordination slightly impaired on the right and normal on the left. knee to heel coordination is normal.sensation is preserved  bilaterally. Deep tendon reflexes are symmetric  Plantars are downgoing. Gait not tested. ASSESSMENT/PLAN [name] is a 71 y.o. 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 He did not receive IV t-PA due to resolved symptoms, on eliquis.   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   Resultant  Diplopia, dizziness  Code Stroke CT no acute abnormality. Aspects 10  CTA head ane neck L VA occlusion vs dissection (poor quality)  MRI  L medullary infarct. L V4 abnormality. L pontine remote microhemorrhage  2D Echo  pending   LDL 77  HgbA1c pending  eliquis for VTE prophylaxis  Diet Heart Room service appropriate? Yes; Fluid consistency: Thin  aspirin 81 mg daily and Eliquis (apixaban) daily prior to admission (he did not take last night), now on aspirin 81 mg daily and Eliquis (apixaban) daily  Give 500 cc NS bolus. HOB flat. Keep pt flat until am.  Discussed possible neurointervention, but feel risk may outweigh benefit. Dr. Leonie Man will discuss further with DR. Devewshwar. - for now, not a candidate. Will consider if pt has additional neuro worsening.  Therapy recommendations:  Pending. Keep flat until am Disposition:  pending  Atrial Fibrillation  Home anticoagulation:  aspirin 81 mg daily and Eliquis (apixaban) daily continued in the hospital    Hypertension  Stable  Permissive hypertension (OK if < 220/120) but gradually normalize in 5-7 days  Long-term BP goal normotensive  Hyperlipidemia  Home meds:  lipitor 40, resumed in hospital  LDL 77, just above goal  Continue statin at discharge  Diabetes  HgbA1c goal < 7.0  Other Stroke Risk Factors  Advanced age  Former Cigarette smoker  ETOH use  Morbid Obesity, Body mass index is 48.56 kg/m.  Obstructive sleep apnea, on CPAP at  home  Chronic diastolic CHF  Hospital day # 0  [name],[name]  [place] for Pager information 06/19/2016 2:39 PM  I have personally examined this patient, reviewed notes, independently viewed imaging studies, participated in medical decision making and plan of care.ROS completed by me personally and pertinent positives fully documented  I have made any additions or clarifications directly to the above note. Agree with note above. He presented with left medullary infarct from the left vertebral artery occlusion likely embolic from atrial fibrillation despite being on Eliquis anticoagulation. He has mild neurological worsening since admission. Recommend he stay flat in bed and hydration. Discuss with Dr. Estanislado Pandy Interventional neuroradiologist. No need for emergent endovascular treatment at the present time. Continue medical management. Discussed with  Dr Wyona Almas, wife and son and answered questions.greater than 50% time during this 35 minute visit was spent on counseling and coordination of care about his stroke and planning treatment [name], [place] Pager: [phone] 06/19/2016 9:24 PM  To contact Stroke Continuity provider, please refer to [email or web address] After hours, contact General Neurology

## 2016-06-19 NOTE — ED Provider Notes (Addendum)
[facility] DEPT Provider Note   CSN: [id number] Arrival date & time: 06/19/16  0222 By signing my name below, I, Georgette Shell, attest that this documentation has been prepared under the direction and in the presence of Varney Biles, MD. Electronically Signed: Georgette Shell, ED Scribe. 06/19/16. 3:02 AM.  History   Chief Complaint Chief Complaint  Patient presents with  . Code Stroke    LSN [id number]   HPI The history is provided by the patient. No language interpreter was used.   HPI Comments: [name] is a 71 y.o. male with h/o anemia, asthma, cancer, A-fib, CHF, HTN, and PE, who presents to the Emergency Department by EMS complaining of sudden onset headache beginning around 1:30 am today with associated photophobia and unsteady left-sided gait. Pain is sharp in quality. Pt additionally states that his voice sounds different and he has some mild difficulty swallowing. Per EMS, pt was laying in bed with his wife at the time of onset. BP was 190/100 en route. Pt is on Eliquis. Denies h/o similar symptoms, no h/o headaches/migraines. Pt denies fever, chills, focal numbness, weakness, paresthesias, difficulty speaking, or any other associated symptoms.   Past Medical History:  Diagnosis Date  . Anemia 07/29/2013  . Asthma    childhood asthma  . Benign prostatic hypertrophy    several prostate biopsies neg for cancer.   . Bilateral hip pain   . Cancer ([place])   . CHF (congestive heart failure) ([place])   . Complication of anesthesia    MORPHINE CAUSES HALLUCINATIONS  . Depression   . FH: colonic polyps 2010  . Gout   . History of kidney stones   . Hypertension   . Kidney cysts   . Morbid obesity ([place])   . Numbness    feet / legs  . PAF (paroxysmal atrial fibrillation) with RVR 07/29/2013  . Sleep apnea    USES C PAP    Patient Active Problem List   Diagnosis Date Noted  . Claudication ([place]) 04/28/2016  . Encounter for monitoring flecainide therapy 04/28/2016  . Shortness of  breath 04/28/2016  . Pure hyperglyceridemia 04/26/2016  . Skin lesion of chest wall 04/26/2016  . Recurrent pulmonary emboli ([place]) 06/13/2015  . PE (pulmonary embolism) 06/13/2015  . GERD (gastroesophageal reflux disease) 06/13/2015  . Asthma 06/13/2015  . Basal cell carcinoma of skin 06/28/2014  . 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 Sinus bradycardia 12/15/2013  . Abnormal serum level of alkaline phosphatase 08/01/2013  . PAF (paroxysmal atrial fibrillation) ([place]) 07/29/2013  . Chronic anticoagulation 07/22/2013  . Gastric ulcer with hemorrhage 07/20/13 07/22/2013  . Pulmonary thromboembolism- 07/16/13 07/16/2013  . Pericardial effusion s/p pericardial window 07/18/13 400cc 07/16/2013  . Diastolic congestive heart failure ([place]) 07/16/2013  . H/O laparoscopic adjustable gastric banding & hiatal hernia repair 04/08/13 04/23/2013  . Prediabetes 02/05/2013  . TMJ (temporomandibular joint syndrome) 09/11/2012  . Morbid obesity ([place]) 08/09/2012  . Peripheral neuropathy ([place]) 11/02/2009  . Hyperlipidemia with target LDL less than 130 09/10/2008  . Depression 09/10/2008  . Obstructive sleep apnea 09/10/2008  . GOUT 06/28/2006  . HTN (hypertension) 06/28/2006  . BPH (benign prostatic hyperplasia) 06/28/2006    Past Surgical History:  Procedure Laterality Date  . ARTHROSCOPY KNEE W/ DRILLING    . BREATH TEK H PYLORI N/A 12/23/2012   Procedure: BREATH TEK H PYLORI;  Surgeon: Gayland Curry, MD;  Location: [facility];  Service: General;  Laterality: N/A;  . CHOLECYSTECTOMY  1989  .  COLONOSCOPY N/A 07/31/2013   Procedure: COLONOSCOPY;  Surgeon: Gatha Mayer, MD;  Location: [facility];  Service: Endoscopy;  Laterality: N/A;  . ESOPHAGOGASTRODUODENOSCOPY N/A 07/22/2013   Procedure: ESOPHAGOGASTRODUODENOSCOPY (EGD);  Surgeon: Irene Shipper, MD;  Location: [facility] ENDOSCOPY;  Service: Endoscopy;  Laterality: N/A;  . ESOPHAGOGASTRODUODENOSCOPY N/A 07/31/2013   Procedure:  ESOPHAGOGASTRODUODENOSCOPY (EGD);  Surgeon: Gatha Mayer, MD;  Location: [facility] ENDOSCOPY;  Service: Endoscopy;  Laterality: N/A;  . INTRAOPERATIVE TRANSESOPHAGEAL ECHOCARDIOGRAM N/A 07/18/2013   Procedure: INTRAOPERATIVE TRANSESOPHAGEAL ECHOCARDIOGRAM;  Surgeon: Grace Isaac, MD;  Location: [facility];  Service: Open Heart Surgery;  Laterality: N/A;  . LAPAROSCOPIC GASTRIC BANDING WITH HIATAL HERNIA REPAIR N/A 04/08/2013   Procedure: LAPAROSCOPIC GASTRIC BANDING WITH possible  HIATAL HERNIA REPAIR;  Surgeon: Gayland Curry, MD;  Location: [facility];  Service: General;  Laterality: N/A;  . LITHOTRIPSY    . renal calculi    . SUBXYPHOID PERICARDIAL WINDOW N/A 07/18/2013   Procedure: SUBXYPHOID PERICARDIAL WINDOW;  Surgeon: Grace Isaac, MD;  Location: [facility];  Service: Thoracic;  Laterality: N/A;  . TONSILLECTOMY    . total knee replacment     bilat  . trigger finger repair     also lue, cts surgically repaired  . WISDOM TOOTH EXTRACTION         Home Medications    Prior to Admission medications   Medication Sig Start Date End Date Taking? Authorizing Provider  amLODipine-benazepril (LOTREL) 5-10 MG capsule Take 1 capsule by mouth daily. 05/23/16   Pixie Casino, MD  apixaban (ELIQUIS) 5 MG TABS tablet Take 1 tablet (5 mg total) by mouth 2 (two) times daily. 10/21/15   Pixie Casino, MD  aspirin 81 MG tablet Take 81 mg by mouth 2 (two) times daily.     Historical Provider, MD  atorvastatin (LIPITOR) 40 MG tablet Take 1 tablet (40 mg total) by mouth daily. 04/22/16   Janith Lima, MD  CALCIUM PO Take 500 mg by mouth 3 (three) times daily.     Historical Provider, MD  Cyanocobalamin (VITAMIN B-12) 1000 MCG SUBL Place 1,000 mcg under the tongue every evening.     Historical Provider, MD  Docusate Sodium (DSS) 100 MG CAPS Take 100 mg by mouth every other day. 07/25/13   Kelvin Cellar, MD  finasteride (PROSCAR) 5 MG tablet Take 5 mg by mouth daily.     Historical Provider, MD  flecainide  (TAMBOCOR) 50 MG tablet TAKE ONE TABLET BY MOUTH EVERY 12 HOURS 10/01/15   Pixie Casino, MD  furosemide (LASIX) 20 MG tablet TAKE 1 TAB ONCE A DAY AS NEEDED FOR SWELLING IN THE LEGS 02/06/16   Janith Lima, MD  loratadine (CLARITIN) 10 MG tablet Take 10 mg by mouth daily.    Historical Provider, MD  LORazepam (ATIVAN) 0.5 MG tablet TAKE ONE TABLET BY MOUTH ONCE DAILY IN THE MORNING AND TWO AT BEDTIME 05/30/16   Kathlee Nations, MD  montelukast (SINGULAIR) 10 MG tablet TAKE 1 TABLET BY MOUTH ONCE A DAY AT BEDTIME 06/16/16   Biagio Borg, MD  pantoprazole (PROTONIX) 40 MG tablet Take 1 tablet (40 mg total) by mouth 2 (two) times daily. 04/22/16   Janith Lima, MD  risperiDONE (RISPERDAL) 0.5 MG tablet Take 1 tablet (0.5 mg total) by mouth at bedtime. 05/30/16   Kathlee Nations, MD  rOPINIRole (REQUIP) 0.25 MG tablet TAKE THREE TABLETS BY MOUTH AT BEDTIME OR  AS  DIRECTED  FOR  LEG  JERKS 09/16/15   Deneise Lever, MD  sertraline (ZOLOFT) 100 MG tablet TAKE ONE AND ONE-HALF TABLETS BY MOUTH ONCE DAILY WITH  BREAKFAST 05/30/16   Kathlee Nations, MD    Family History Family History  Problem Relation Age of Onset  . Depression Mother   . Hypertension Mother   . Heart disease Father     MI  . Depression Brother   . Stroke Paternal Aunt     CVA  . Diabetes Paternal Uncle   . Heart disease Paternal Uncle     MI  . Heart disease Paternal Grandmother     MI  . Diabetes Cousin     PATERNAL    Social History Social History  Substance Use Topics  . Smoking status: Former Smoker    Packs/day: 0.20    Years: 13.00    Types: Cigarettes    Start date: 04/03/1961    Quit date: 08/30/1974  . Smokeless tobacco: Never Used  . Alcohol use 0.0 oz/week     Comment: Occasionally; 1 small drink a week     Allergies   Morphine; Codeine; Penicillins; and Propoxyphene n-acetaminophen   Review of Systems Review of Systems 10 Systems reviewed and all are negative for acute change except as noted in the  HPI. Physical Exam Updated Vital Signs BP (!) 182/100   Pulse 63   Temp 98.5 F (36.9 C) (Oral)   Resp 17   Wt (S) (!) 358 lb 0.4 oz (162.4 kg)   SpO2 98%   BMI 48.56 kg/m   Physical Exam  Constitutional: He is oriented to person, place, and time. He appears well-developed and well-nourished.  HENT:  Head: Normocephalic.  Eyes: Conjunctivae are normal.  LEFT EYE PRESSURES: 15 / 17/ 19/ 20 - SO average of 18 mmhg   Cardiovascular: Normal rate.   Pulmonary/Chest: Effort normal. No respiratory distress.  Abdominal: He exhibits no distension.  Musculoskeletal: Normal range of motion.  Neurological: He is alert and oriented to person, place, and time.  Cerebellar exam is normal (finger to nose) Sensory exam normal for bilateral upper and lower extremities - and patient is able to discriminate between sharp and dull. Motor exam is 4+/5    Skin: Skin is warm and dry.  Psychiatric: He has a normal mood and affect. His behavior is normal.  Nursing note and vitals reviewed.    ED Treatments / Results  DIAGNOSTIC STUDIES: Oxygen Saturation is 95% on RA, adequate by my interpretation.    COORDINATION OF CARE: 3:02 AM Discussed treatment plan with pt at bedside and pt agreed to plan.  Labs (all labs ordered are listed, but only abnormal results are displayed) Labs Reviewed  DIFFERENTIAL - Abnormal; Notable for the following:       Result Value   Monocytes Absolute 1.1 (*)    All other components within normal limits  COMPREHENSIVE METABOLIC PANEL - Abnormal; Notable for the following:    Glucose, Bld 113 (*)    Calcium 8.2 (*)    Total Protein 5.9 (*)    Albumin 3.3 (*)    All other components within normal limits  CBG MONITORING, ED - Abnormal; Notable for the following:    Glucose-Capillary 105 (*)    All other components within normal limits  I-STAT CHEM 8, ED - Abnormal; Notable for the following:    Potassium 3.2 (*)    Glucose, Bld 115 (*)    Calcium, Ion 0.93  (*)  All other components within normal limits  PROTIME-INR  APTT  CBC  I-STAT TROPOININ, ED    EKG  EKG Interpretation None       Radiology Ct Angio Head W Or Wo Contrast  Result Date: 06/19/2016 CLINICAL DATA:  Left-sided weakness.  Concern for dissection. EXAM: CT ANGIOGRAPHY HEAD AND NECK TECHNIQUE: Multidetector CT imaging of the head and neck was performed using the standard protocol during bolus administration of intravenous contrast. Multiplanar CT image reconstructions and MIPs were obtained to evaluate the vascular anatomy. Carotid stenosis measurements (when applicable) are obtained utilizing NASCET criteria, using the distal internal carotid diameter as the denominator. CONTRAST:  50 mL Isovue 370 IV COMPARISON:  Head CT 06/19/2016 FINDINGS: The examination is degraded by poor contrast bolus. CTA NECK FINDINGS Aortic arch: There is no aneurysm or dissection of the visualized ascending aorta or aortic arch. There is an average right subclavian artery. The brachiocephalic and left common carotid arteries share a common origin. The visualized proximal subclavian arteries are normal. There is calcific aortic atherosclerosis. Right carotid system: There is no common carotid or internal carotid artery dissection or aneurysm. No hemodynamically significant stenosis. Left carotid system: There is no common carotid or internal carotid artery dissection or aneurysm. Minimal bifurcation atherosclerosis without hemodynamically significant stenosis. Vertebral arteries: The vertebral system is left dominant. Assessment of the vertebral artery origins and proximal V2 segments is severely degraded by poor signal to noise ratio. There is abrupt termination of the left vertebral artery at the junction of the V3 and V4 segments, at the skullbase (sagittal images 166). Skeleton: There is no bony spinal canal stenosis. No lytic or blastic lesions. Other neck: The nasopharynx is clear. The oropharynx and  hypopharynx are normal. The epiglottis is normal. The supraglottic larynx, glottis and subglottic larynx are normal. No retropharyngeal collection. The parapharyngeal spaces are preserved. The parotid and submandibular glands are normal. No sialolithiasis or salivary ductal dilatation. The thyroid gland is normal. There is no cervical lymphadenopathy. Upper chest: No pneumothorax or pleural effusion. No nodules or masses. Review of the MIP images confirms the above findings CTA HEAD FINDINGS Anterior circulation: --Intracranial internal carotid arteries: Normal. --Anterior cerebral arteries: Normal. --Middle cerebral arteries: Normal proximally. The poor contrast bolus degrades assessment of the distal circulation. --Posterior communicating arteries: Present on the right. Posterior circulation: --Posterior cerebral arteries: Normal proximally. Poor contrast bolus degrades distal assessment. --Superior cerebellar arteries: Normal on the right. Not clearly visualized on the left. --Basilar artery: Normal. --Anterior inferior cerebellar arteries: Normal. --Posterior inferior cerebellar arteries: Normal on the right. Poorly opacified on the left. Venous sinuses: As permitted by contrast timing, patent. Anatomic variants: None Delayed phase: No parenchymal contrast enhancement. Review of the MIP images confirms the above findings IMPRESSION: 1. Examination degraded by poor contrast bolus. 2. 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 This suggests acute dissection, though occlusive thrombus could cause the same appearance. 3. No proximal occlusion of the intracranial arteries. 4. No hemodynamically significant stenosis or dissection of the carotid arteries. These results were called by telephone at the time of interpretation on 06/19/2016 at 3:15 am to Dr. Roland Rack , who verbally acknowledged these results. Electronically Signed   By: Ulyses Jarred M.D.   On: 06/19/2016 03:25   Ct  Angio Neck W Or Wo Contrast  Result Date: 06/19/2016 CLINICAL DATA:  Left-sided weakness.  Concern for dissection. EXAM: CT ANGIOGRAPHY HEAD AND NECK TECHNIQUE: Multidetector CT imaging of the head and neck  was performed using the standard protocol during bolus administration of intravenous contrast. Multiplanar CT image reconstructions and MIPs were obtained to evaluate the vascular anatomy. Carotid stenosis measurements (when applicable) are obtained utilizing NASCET criteria, using the distal internal carotid diameter as the denominator. CONTRAST:  50 mL Isovue 370 IV COMPARISON:  Head CT 06/19/2016 FINDINGS: The examination is degraded by poor contrast bolus. CTA NECK FINDINGS Aortic arch: There is no aneurysm or dissection of the visualized ascending aorta or aortic arch. There is an average right subclavian artery. The brachiocephalic and left common carotid arteries share a common origin. The visualized proximal subclavian arteries are normal. There is calcific aortic atherosclerosis. Right carotid system: There is no common carotid or internal carotid artery dissection or aneurysm. No hemodynamically significant stenosis. Left carotid system: There is no common carotid or internal carotid artery dissection or aneurysm. Minimal bifurcation atherosclerosis without hemodynamically significant stenosis. Vertebral arteries: The vertebral system is left dominant. Assessment of the vertebral artery origins and proximal V2 segments is severely degraded by poor signal to noise ratio. There is abrupt termination of the left vertebral artery at the junction of the V3 and V4 segments, at the skullbase (sagittal images 166). Skeleton: There is no bony spinal canal stenosis. No lytic or blastic lesions. Other neck: The nasopharynx is clear. The oropharynx and hypopharynx are normal. The epiglottis is normal. The supraglottic larynx, glottis and subglottic larynx are normal. No retropharyngeal collection. The  parapharyngeal spaces are preserved. The parotid and submandibular glands are normal. No sialolithiasis or salivary ductal dilatation. The thyroid gland is normal. There is no cervical lymphadenopathy. Upper chest: No pneumothorax or pleural effusion. No nodules or masses. Review of the MIP images confirms the above findings CTA HEAD FINDINGS Anterior circulation: --Intracranial internal carotid arteries: Normal. --Anterior cerebral arteries: Normal. --Middle cerebral arteries: Normal proximally. The poor contrast bolus degrades assessment of the distal circulation. --Posterior communicating arteries: Present on the right. Posterior circulation: --Posterior cerebral arteries: Normal proximally. Poor contrast bolus degrades distal assessment. --Superior cerebellar arteries: Normal on the right. Not clearly visualized on the left. --Basilar artery: Normal. --Anterior inferior cerebellar arteries: Normal. --Posterior inferior cerebellar arteries: Normal on the right. Poorly opacified on the left. Venous sinuses: As permitted by contrast timing, patent. Anatomic variants: None Delayed phase: No parenchymal contrast enhancement. Review of the MIP images confirms the above findings IMPRESSION: 1. Examination degraded by poor contrast bolus. 2. 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 This suggests acute dissection, though occlusive thrombus could cause the same appearance. 3. No proximal occlusion of the intracranial arteries. 4. No hemodynamically significant stenosis or dissection of the carotid arteries. These results were called by telephone at the time of interpretation on 06/19/2016 at 3:15 am to Dr. Roland Rack , who verbally acknowledged these results. Electronically Signed   By: Ulyses Jarred M.D.   On: 06/19/2016 03:25   Ct Head Code Stroke W/o Cm  Result Date: 06/19/2016 CLINICAL DATA:  Code stroke.  Left-sided weakness EXAM: CT HEAD WITHOUT CONTRAST TECHNIQUE:  Contiguous axial images were obtained from the base of the skull through the vertex without intravenous contrast. COMPARISON:  None. FINDINGS: Brain: No mass lesion, intraparenchymal hemorrhage or extra-axial collection. No evidence of acute cortical infarct. Brain parenchyma and CSF-containing spaces are normal for age. Vascular: No hyperdense vessel or unexpected calcification. Skull: Normal visualized skull base, calvarium and extracranial soft tissues. Sinuses/Orbits: No sinus fluid levels or advanced mucosal thickening. No mastoid effusion. Normal orbits. ASPECTS (  [place] Stroke Program Early CT Score) - Ganglionic level infarction (caudate, lentiform nuclei, internal capsule, insula, M1-M3 cortex): 7 - Supraganglionic infarction (M4-M6 cortex): 3 Total score (0-10 with 10 being normal): 10 IMPRESSION: 1. No acute intracranial abnormality. 2. ASPECTS is 10. These results were called by telephone at the time of interpretation on 06/19/2016 at 2:42 am to Dr. Roland Rack, who verbally acknowledged these results. Electronically Signed   By: Ulyses Jarred M.D.   On: 06/19/2016 02:42    Procedures Procedures (including critical care time)  Medications Ordered in ED Medications  iopamidol (ISOVUE-370) 76 % injection (50 mLs Intravenous Contrast Given 06/19/16 0245)     Initial Impression / Assessment and Plan / ED Course  I have reviewed the triage vital signs and the nursing notes.  Pertinent labs & imaging results that were available during my care of the patient were reviewed by me and considered in my medical decision making (see chart for details).     Pt comes in with cc of headaches and dizziness. PT comes in as code stroke. Pt has hx of PAF, PE - on NOAC, and CHF, HTN. He has no hx of strokes. Pt is having dizziness, leaning to the L side and headache on the L side behind his eye.  Code stroke activated. Ct shows concerns for dissection. Pt is not a candidate for TPA as he is on  NOAC, and id this is dissection - he is already anticoagulated. Spoke with Neuro - they think the CT is equivocal and request admission.  I am adding sed rate given the temporal headaches. I will check eye pressures as well.   Final Clinical Impressions(s) / ED Diagnoses   Final diagnoses:  Left-sided weakness  Left-sided weakness    New Prescriptions New Prescriptions   No medications on file   I personally performed the services described in this documentation, which was scribed in my presence. The recorded information has been reviewed and is accurate.    Varney Biles, MD 06/19/16 [place], MD 06/19/16 [phone]

## 2016-06-19 NOTE — ED Notes (Signed)
ED Provider at bedside. 

## 2016-06-20 DIAGNOSIS — Z87891 Personal history of nicotine dependence: Secondary | ICD-10-CM | POA: Diagnosis not present

## 2016-06-20 DIAGNOSIS — I5033 Acute on chronic diastolic (congestive) heart failure: Secondary | ICD-10-CM | POA: Diagnosis present

## 2016-06-20 DIAGNOSIS — I6502 Occlusion and stenosis of left vertebral artery: Secondary | ICD-10-CM | POA: Diagnosis present

## 2016-06-20 DIAGNOSIS — M109 Gout, unspecified: Secondary | ICD-10-CM | POA: Diagnosis present

## 2016-06-20 DIAGNOSIS — Z6841 Body Mass Index (BMI) 40.0 and over, adult: Secondary | ICD-10-CM | POA: Diagnosis not present

## 2016-06-20 DIAGNOSIS — D72829 Elevated white blood cell count, unspecified: Secondary | ICD-10-CM | POA: Diagnosis not present

## 2016-06-20 DIAGNOSIS — R51 Headache: Secondary | ICD-10-CM | POA: Diagnosis not present

## 2016-06-20 DIAGNOSIS — J45909 Unspecified asthma, uncomplicated: Secondary | ICD-10-CM | POA: Diagnosis present

## 2016-06-20 DIAGNOSIS — I69391 Dysphagia following cerebral infarction: Secondary | ICD-10-CM

## 2016-06-20 DIAGNOSIS — K219 Gastro-esophageal reflux disease without esophagitis: Secondary | ICD-10-CM | POA: Diagnosis present

## 2016-06-20 DIAGNOSIS — Z7901 Long term (current) use of anticoagulants: Secondary | ICD-10-CM | POA: Diagnosis not present

## 2016-06-20 DIAGNOSIS — G4733 Obstructive sleep apnea (adult) (pediatric): Secondary | ICD-10-CM

## 2016-06-20 DIAGNOSIS — Z86711 Personal history of pulmonary embolism: Secondary | ICD-10-CM

## 2016-06-20 DIAGNOSIS — F39 Unspecified mood [affective] disorder: Secondary | ICD-10-CM | POA: Diagnosis present

## 2016-06-20 DIAGNOSIS — Z880 Allergy status to penicillin: Secondary | ICD-10-CM | POA: Diagnosis not present

## 2016-06-20 DIAGNOSIS — Z79899 Other long term (current) drug therapy: Secondary | ICD-10-CM | POA: Diagnosis not present

## 2016-06-20 DIAGNOSIS — I639 Cerebral infarction, unspecified: Secondary | ICD-10-CM | POA: Diagnosis not present

## 2016-06-20 DIAGNOSIS — Z96653 Presence of artificial knee joint, bilateral: Secondary | ICD-10-CM | POA: Diagnosis not present

## 2016-06-20 DIAGNOSIS — R531 Weakness: Secondary | ICD-10-CM | POA: Diagnosis not present

## 2016-06-20 DIAGNOSIS — I480 Paroxysmal atrial fibrillation: Secondary | ICD-10-CM | POA: Diagnosis not present

## 2016-06-20 DIAGNOSIS — G463 Brain stem stroke syndrome: Secondary | ICD-10-CM | POA: Diagnosis not present

## 2016-06-20 DIAGNOSIS — R269 Unspecified abnormalities of gait and mobility: Secondary | ICD-10-CM | POA: Diagnosis not present

## 2016-06-20 DIAGNOSIS — I10 Essential (primary) hypertension: Secondary | ICD-10-CM | POA: Diagnosis not present

## 2016-06-20 DIAGNOSIS — F419 Anxiety disorder, unspecified: Secondary | ICD-10-CM | POA: Diagnosis present

## 2016-06-20 DIAGNOSIS — G8194 Hemiplegia, unspecified affecting left nondominant side: Secondary | ICD-10-CM | POA: Diagnosis present

## 2016-06-20 DIAGNOSIS — I5030 Unspecified diastolic (congestive) heart failure: Secondary | ICD-10-CM

## 2016-06-20 DIAGNOSIS — I6340 Cerebral infarction due to embolism of unspecified cerebral artery: Secondary | ICD-10-CM | POA: Diagnosis present

## 2016-06-20 DIAGNOSIS — R208 Other disturbances of skin sensation: Secondary | ICD-10-CM | POA: Diagnosis not present

## 2016-06-20 DIAGNOSIS — Z823 Family history of stroke: Secondary | ICD-10-CM | POA: Diagnosis not present

## 2016-06-20 DIAGNOSIS — K5901 Slow transit constipation: Secondary | ICD-10-CM | POA: Diagnosis not present

## 2016-06-20 DIAGNOSIS — R1312 Dysphagia, oropharyngeal phase: Secondary | ICD-10-CM | POA: Diagnosis not present

## 2016-06-20 DIAGNOSIS — Z885 Allergy status to narcotic agent status: Secondary | ICD-10-CM | POA: Diagnosis not present

## 2016-06-20 DIAGNOSIS — Z9989 Dependence on other enabling machines and devices: Secondary | ICD-10-CM | POA: Diagnosis not present

## 2016-06-20 DIAGNOSIS — I69393 Ataxia following cerebral infarction: Secondary | ICD-10-CM | POA: Diagnosis not present

## 2016-06-20 DIAGNOSIS — E876 Hypokalemia: Secondary | ICD-10-CM

## 2016-06-20 DIAGNOSIS — N400 Enlarged prostate without lower urinary tract symptoms: Secondary | ICD-10-CM | POA: Diagnosis not present

## 2016-06-20 DIAGNOSIS — I110 Hypertensive heart disease with heart failure: Secondary | ICD-10-CM | POA: Diagnosis present

## 2016-06-20 DIAGNOSIS — E785 Hyperlipidemia, unspecified: Secondary | ICD-10-CM | POA: Diagnosis present

## 2016-06-20 DIAGNOSIS — K5900 Constipation, unspecified: Secondary | ICD-10-CM | POA: Diagnosis present

## 2016-06-20 DIAGNOSIS — I6349 Cerebral infarction due to embolism of other cerebral artery: Secondary | ICD-10-CM | POA: Diagnosis not present

## 2016-06-20 DIAGNOSIS — R339 Retention of urine, unspecified: Secondary | ICD-10-CM | POA: Diagnosis not present

## 2016-06-20 DIAGNOSIS — F329 Major depressive disorder, single episode, unspecified: Secondary | ICD-10-CM | POA: Diagnosis present

## 2016-06-20 DIAGNOSIS — R1310 Dysphagia, unspecified: Secondary | ICD-10-CM | POA: Diagnosis present

## 2016-06-20 DIAGNOSIS — I4891 Unspecified atrial fibrillation: Secondary | ICD-10-CM | POA: Diagnosis present

## 2016-06-20 DIAGNOSIS — I63112 Cerebral infarction due to embolism of left vertebral artery: Secondary | ICD-10-CM | POA: Diagnosis not present

## 2016-06-20 DIAGNOSIS — Z7982 Long term (current) use of aspirin: Secondary | ICD-10-CM | POA: Diagnosis not present

## 2016-06-20 DIAGNOSIS — J3801 Paralysis of vocal cords and larynx, unilateral: Secondary | ICD-10-CM | POA: Diagnosis present

## 2016-06-20 DIAGNOSIS — I5032 Chronic diastolic (congestive) heart failure: Secondary | ICD-10-CM | POA: Diagnosis present

## 2016-06-20 DIAGNOSIS — Z7951 Long term (current) use of inhaled steroids: Secondary | ICD-10-CM | POA: Diagnosis not present

## 2016-06-20 LAB — HEMOGLOBIN A1C
Hgb A1c MFr Bld: 5.8 % — ABNORMAL HIGH (ref 4.8–5.6)
Mean Plasma Glucose: 120 mg/dL

## 2016-06-20 LAB — HEPARIN LEVEL (UNFRACTIONATED): HEPARIN UNFRACTIONATED: 1.1 [IU]/mL — AB (ref 0.30–0.70)

## 2016-06-20 LAB — APTT: APTT: 61 s — AB (ref 24–36)

## 2016-06-20 MED ORDER — POTASSIUM CHLORIDE IN NACL 40-0.9 MEQ/L-% IV SOLN
INTRAVENOUS | Status: DC
  Administered 2016-06-20: 75 mL/h via INTRAVENOUS
  Filled 2016-06-20 (×2): qty 1000

## 2016-06-20 MED ORDER — DILTIAZEM HCL 25 MG/5ML IV SOLN
10.0000 mg | Freq: Four times a day (QID) | INTRAVENOUS | Status: DC | PRN
  Filled 2016-06-20: qty 5

## 2016-06-20 MED ORDER — HEPARIN (PORCINE) IN NACL 100-0.45 UNIT/ML-% IJ SOLN
1500.0000 [IU]/h | INTRAMUSCULAR | Status: DC
  Administered 2016-06-20: 1500 [IU]/h via INTRAVENOUS
  Filled 2016-06-20: qty 250

## 2016-06-20 MED ORDER — HEPARIN (PORCINE) IN NACL 100-0.45 UNIT/ML-% IJ SOLN
1850.0000 [IU]/h | INTRAMUSCULAR | Status: DC
  Administered 2016-06-20 – 2016-06-21 (×2): 1650 [IU]/h via INTRAVENOUS
  Filled 2016-06-20: qty 250

## 2016-06-20 MED ORDER — PANTOPRAZOLE SODIUM 40 MG IV SOLR
40.0000 mg | Freq: Every day | INTRAVENOUS | Status: DC
  Administered 2016-06-20: 40 mg via INTRAVENOUS
  Filled 2016-06-20: qty 40

## 2016-06-20 MED ORDER — SODIUM CHLORIDE 0.9 % IV SOLN: INTRAVENOUS | Status: DC

## 2016-06-20 MED ORDER — METOPROLOL TARTRATE 5 MG/5ML IV SOLN: 5.0000 mg | INTRAVENOUS | Status: DC | PRN

## 2016-06-20 MED ORDER — ASPIRIN 300 MG RE SUPP
150.0000 mg | Freq: Every day | RECTAL | Status: DC
  Administered 2016-06-20: 150 mg via RECTAL
  Filled 2016-06-20 (×2): qty 1

## 2016-06-20 MED ORDER — FUROSEMIDE 10 MG/ML IJ SOLN
20.0000 mg | Freq: Every day | INTRAMUSCULAR | Status: DC
  Filled 2016-06-20 (×2): qty 4

## 2016-06-20 MED ORDER — LORAZEPAM 2 MG/ML IJ SOLN: 0.5000 mg | Freq: Two times a day (BID) | INTRAMUSCULAR | Status: DC | PRN

## 2016-06-20 NOTE — Progress Notes (Signed)
ANTICOAGULATION CONSULT NOTE - Initial Consult  Pharmacy Consult for heparin Indication: atrial fibrillation  Allergies  Allergen Reactions  . Morphine Other (See Comments)    Nausea and Severe hallucinations  . Codeine Nausea Only  . Penicillins Other (See Comments)    LOC with shot  . Propoxyphene N-Acetaminophen Nausea Only    Patient Measurements: Weight: (S) (!) 358 lb 0.4 oz (162.4 kg) Heparin Dosing Weight: 116.5 KG  Vital Signs: Temp: 97.5 F (36.4 C) (03/20 0923) Temp Source: Oral (03/20 0923) BP: 148/66 (03/20 0923) Pulse Rate: 49 (03/20 0923)  Labs:  Recent Labs  06/19/16 0223 06/19/16 0233  HGB 14.6 14.6  HCT 44.4 43.0  PLT 183  --   APTT 25  --   LABPROT 14.2  --   INR 1.09  --   CREATININE 1.07 1.00    Estimated Creatinine Clearance: 108.4 mL/min (by C-G formula based on SCr of 1 mg/dL).   Medical History: Past Medical History:  Diagnosis Date  . Anemia 07/29/2013  . Asthma    childhood asthma  . Benign prostatic hypertrophy    several prostate biopsies neg for cancer.   . Bilateral hip pain   . Cancer ([place])   . CHF (congestive heart failure) ([place])   . Complication of anesthesia    MORPHINE CAUSES HALLUCINATIONS  . Depression   . FH: colonic polyps 2010  . Gout   . History of kidney stones   . Hypertension   . Kidney cysts   . Morbid obesity ([place])   . Numbness    feet / legs  . PAF (paroxysmal atrial fibrillation) with RVR 07/29/2013  . Sleep apnea    USES C PAP    Medications:  Prescriptions Prior to Admission  Medication Sig Dispense Refill Last Dose  . amLODipine-benazepril (LOTREL) 5-10 MG capsule Take 1 capsule by mouth daily. 30 capsule 5 06/18/2016 at Unknown time  . apixaban (ELIQUIS) 5 MG TABS tablet Take 1 tablet (5 mg total) by mouth 2 (two) times daily. 180 tablet 3 06/18/2016 at am  . aspirin 81 MG tablet Take 81 mg by mouth 2 (two) times daily.    06/18/2016 at am  . atorvastatin (LIPITOR) 40 MG tablet Take 1 tablet  (40 mg total) by mouth daily. 90 tablet 0 06/18/2016 at Unknown time  . budesonide-formoterol (SYMBICORT) 160-4.5 MCG/ACT inhaler Inhale 2 puffs into the lungs 2 (two) times daily as needed (shortness of breath).   unk  . CALCIUM PO Take 500 mg by mouth 3 (three) times daily.    06/18/2016 at Unknown time  . Cyanocobalamin (VITAMIN B-12) 1000 MCG SUBL Place 1,000 mcg under the tongue every evening.    06/18/2016 at Unknown time  . Docusate Sodium (DSS) 100 MG CAPS Take 100 mg by mouth every other day.   Past Week at Unknown time  . finasteride (PROSCAR) 5 MG tablet Take 5 mg by mouth daily.    06/18/2016 at Unknown time  . flecainide (TAMBOCOR) 50 MG tablet TAKE ONE TABLET BY MOUTH EVERY 12 HOURS 180 tablet 2 06/18/2016 at am  . furosemide (LASIX) 20 MG tablet TAKE 1 TAB ONCE A DAY AS NEEDED FOR SWELLING IN THE LEGS (Patient taking differently: TAKE 1 TAB ONCE A DAY FOR SWELLING IN THE LEGS) 90 tablet 1 06/18/2016 at Unknown time  . loratadine (CLARITIN) 10 MG tablet Take 10 mg by mouth daily.   06/18/2016 at Unknown time  . LORazepam (ATIVAN) 0.5 MG tablet TAKE ONE  TABLET BY MOUTH ONCE DAILY IN THE MORNING AND TWO AT BEDTIME 90 tablet 2 06/18/2016 at am  . montelukast (SINGULAIR) 10 MG tablet TAKE 1 TABLET BY MOUTH ONCE A DAY AT BEDTIME 30 tablet 1 06/18/2016 at Unknown time  . naphazoline-pheniramine (NAPHCON-A) 0.025-0.3 % ophthalmic solution Place 1 drop into both eyes 4 (four) times daily as needed for irritation.   unk  . pantoprazole (PROTONIX) 40 MG tablet Take 1 tablet (40 mg total) by mouth 2 (two) times daily. 180 tablet 0 06/18/2016 at am  . risperiDONE (RISPERDAL) 0.5 MG tablet Take 1 tablet (0.5 mg total) by mouth at bedtime. 30 tablet 2 06/17/2016  . rOPINIRole (REQUIP) 0.25 MG tablet TAKE THREE TABLETS BY MOUTH AT BEDTIME OR  AS  DIRECTED  FOR  LEG  JERKS 90 tablet 11 06/17/2016  . sertraline (ZOLOFT) 100 MG tablet TAKE ONE AND ONE-HALF TABLETS BY MOUTH ONCE DAILY WITH  BREAKFAST 45 tablet 2  06/18/2016 at Unknown time    Assessment: Cockmanis a 71 y.o.malewith a complex cardiac medical history significant for Afib and PE on Eliquis. History of GI bleed/ stomach ulcers while on Xarelto in the past. Neurologic workup showed possible left medullary small acute infarct along with possible left vertebral artery occlusion. Patient is NPO and pharmacy has been asked to start heparin infusion. Last dose of Eliquis 06/19/16 @ 0732. CBC from yesterday stable with Hgb 14.6 and Plt 183. No bleeding reported. Will start infusion without bolus and order aPTT until heparin levels correlate.   Goal of Therapy:  Heparin level 0.3-0.5 units/ml aPTT 66-85 seconds Monitor platelets by anticoagulation protocol: Yes   Plan:  Start heparin infusion at 1500 units/hr (~13 units/kg/hr) Check aPTT and anti-Xa level in 8 hours and daily while on heparin Continue to monitor H&H and platelets  Georga Bora, PharmD Clinical Pharmacist Pager: [phone] 06/20/2016 11:37 AM

## 2016-06-20 NOTE — Progress Notes (Signed)
ANTICOAGULATION CONSULT NOTE - FOLLOW UP    HL = 1.1 (goal 0.3 - 0.5 units/mL) APTT = 61 (goal 66-85 sec) Heparin dosing weight = 117 kg   Assessment: 70 YOM with history of Afib and PE on Eliquis PTA, last dose on 06/19/16 around 0730.  Patient continues on IV heparin bridge while Eliquis is on hold.  Noted possible left medullary small acute infarct along with left vertebral artery occlusion.   Currently using aPTT to guide heparin dosing since Eliquis may falsely elevate heparin levels.  APTT is slightly sub-therapeutic.  No issue with heparin infusion per RN; no bleeding reported.   Plan: - Increase heparin gtt to 1650 units/hr - Check 8 hr heparin level    [name], PharmD, BCPS Pager:  [phone] 06/20/2016, 9:46 PM

## 2016-06-20 NOTE — Progress Notes (Signed)
Inpatient Rehabilitation  Met with patient to discuss team's recommendation for IP Rehab.  Shared booklets and answered questions.  Patient eager to regain his independence and expressed concern about his blurry, double vision.  Plan to follow along for timing of medical readiness and bed availability.  Please call with questions.     , M.A., CCC/SLP Admission Coordinator  [facility]  Cell [phone]  

## 2016-06-20 NOTE — Evaluation (Addendum)
Clinical/Bedside Swallow Evaluation Patient Details  Name: [name] MRN: [id number] Date of Birth: [birth year]  Today's Date: 06/20/2016 Time: SLP Start Time (ACUTE ONLY): 0740 SLP Stop Time (ACUTE ONLY): 0810 SLP Time Calculation (min) (ACUTE ONLY): 30 min  Past Medical History:  Past Medical History:  Diagnosis Date  . Anemia 07/29/2013  . Asthma    childhood asthma  . Benign prostatic hypertrophy    several prostate biopsies neg for cancer.   . Bilateral hip pain   . Cancer ([place])   . CHF (congestive heart failure) ([place])   . Complication of anesthesia    MORPHINE CAUSES HALLUCINATIONS  . Depression   . FH: colonic polyps 2010  . Gout   . History of kidney stones   . Hypertension   . Kidney cysts   . Morbid obesity ([place])   . Numbness    feet / legs  . PAF (paroxysmal atrial fibrillation) with RVR 07/29/2013  . Sleep apnea    USES C PAP   Past Surgical History:  Past Surgical History:  Procedure Laterality Date  . ARTHROSCOPY KNEE W/ DRILLING    . BREATH TEK H PYLORI N/A 12/23/2012   Procedure: BREATH TEK H PYLORI;  Surgeon: Gayland Curry, MD;  Location: [facility];  Service: General;  Laterality: N/A;  . CHOLECYSTECTOMY  1989  . COLONOSCOPY N/A 07/31/2013   Procedure: COLONOSCOPY;  Surgeon: Gatha Mayer, MD;  Location: [facility];  Service: Endoscopy;  Laterality: N/A;  . ESOPHAGOGASTRODUODENOSCOPY N/A 07/22/2013   Procedure: ESOPHAGOGASTRODUODENOSCOPY (EGD);  Surgeon: Irene Shipper, MD;  Location[facility] END[facility]COPY;  Service: Endoscopy;  Laterality: N/A;  . ESOPHAGOGASTRODUODENOSCOPY N/A 07/31/2013   Procedure: ESOPHAGOGASTRODUODENOSCOPY (EGD);  Surgeon: Gatha Mayer, MD;  Location[facility] ENDOSCOPY;  Service: Endoscopy;  Laterality: N/A;  . INTRAOPERATIVE TRANSESOPHAGEAL ECHOCARDIOGRAM N/A 07/18/2013   Procedure: INTRAOPERATIVE TRANSESOPHAGEAL ECHOCARDIOGRAM;  Surgeon: Grace Isaac, MD;  Location: [facility];  Service: Open Heart Surgery;  Laterality: N/A;  .  LAPAROSCOPIC GASTRIC BANDING WITH HIATAL HERNIA REPAIR N/A 04/08/2013   Procedure: LAPAROSCOPIC GASTRIC BANDING WITH possible  HIATAL HERNIA REPAIR;  Surgeon: Gayland Curry, MD;  Location: [facility];  Service: General;  Laterality: N/A;  . LITHOTRIPSY    . renal calculi    . SUBXYPHOID PERICARDIAL WINDOW N/A 07/18/2013   Procedure: SUBXYPHOID PERICARDIAL WINDOW;  Surgeon: Grace Isaac, MD;  Location: [facility];  Service: Thoracic;  Laterality: N/A;  . TONSILLECTOMY    . total knee replacment     bilat  . trigger finger repair     also lue, cts surgically repaired  . WISDOM TOOTH EXTRACTION     HPI:  71 yo male adm to [facility] with headache, found to have single focus of remote microhemorrhage in the L Pon, abnormal signal within V4 segment of L vertebral artery, faint focus of reduced diffusion within L medulla concerning for small infarct. Pt with worsening neurological symptoms during hospital stay including left sided weakness, dysphagia, vision deficits and dizziness.  Pt passed RNSSS initially- SlP swallow evaluation ordered due to pt's sudden onset of dysphagia.     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  Involvement of facial, glossopharyngeal and vagal nerves noted during CN exam.   Pt's phonation and cough are weak, concerning for airway protection.  Ice chips provided with possible delayed pharyngeal swallow, decreased laryngeal elevation palpated at bedside and concern  for aspiration.  Delayed cough noted with HOB lowering concerning for aspiration of pharyngeal residuals.    Pt able to rinse and expectorate with water well, for oral hygeine and comfort recommend allow him to continue rinsing.  Pt will need instrumental swallow evaluation prior to po administration due to risk for sensory deficits.    He admits to overtly sensing pharyngeal residuals with minimal amount of  water and applesauce requiring him to expectorate and causing cough earlier in hospital stay indicative of severe dysphagia.    Educated pt and son to medullary CVA impacting pharyngeal swallow significantly and recommendations.  They agreed to plan.  Recommend NPO except oral rinse and SLP to follow clinically to determine readiness for instrumental evaluation/MBS before po given. Will follow up next date.  SLP Visit Diagnosis: Dysphagia, oropharyngeal phase (R13.12)    Aspiration Risk  Severe aspiration risk    Diet Recommendation NPO (swish and expectorate with water)   Medication Administration: Via alternative means    Other  Recommendations Oral Care Recommendations: Oral care QID   Follow up Recommendations Inpatient Rehab      Frequency and Duration min 3x week          Prognosis    fair    Swallow Study   General Date of Onset: 06/20/16 HPI: 71 yo male adm to [facility] with headache, found to have single focus of remote microhemorrhage in the L Pon, abnormal signal within V4 segment of L vertebral artery, faint focus of reduced diffusion within L medulla concerning for small infarct. Pt with worsening neurological symptoms during hospital stay including left sided weakness, dysphagia, vision deficits and dizziness.  Pt passed RNSSS initially- SlP swallow evaluation ordered due to pt's sudden onset of dysphagia.   Type of Study: Bedside Swallow Evaluation Diet Prior to this Study: NPO (x medicine with puree) Temperature Spikes Noted: No Respiratory Status: Nasal cannula (on oxygen at night at home) History of Recent Intubation: No Behavior/Cognition: Alert;Cooperative Oral Cavity Assessment: Dry Oral Care Completed by SLP: No Oral Cavity - Dentition: Adequate natural dentition Vision:  (due to dizziness, did not test) Self-Feeding Abilities: Needs assist Patient Positioning: Upright in bed Baseline Vocal Quality: Low vocal intensity;Suspected CN X (Vagus)  involvement Volitional Cough: Weak Volitional Swallow: Able to elicit (decreased laryngeal elevation palpated at bedside)    Oral/Motor/Sensory Function Overall Oral Motor/Sensory Function: Moderate impairment Facial ROM: Suspected CN VII (facial) dysfunction Facial Symmetry: Suspected CN VII (facial) dysfunction Facial Strength: Suspected CN VII (facial) dysfunction Facial Sensation: Within Functional Limits Lingual ROM: Suspected CN XII (hypoglossal) dysfunction Lingual Symmetry: Within Functional Limits Lingual Strength: Suspected CN XII (hypoglossal) dysfunction Velum: Impaired left;Suspected CN X (Vagus) dysfunction (deviates to right)   Ice Chips Ice chips: Impaired Presentation: Spoon Pharyngeal Phase Impairments: Decreased hyoid-laryngeal movement;Cough - Delayed Other Comments: delayed cough with HOB lowered, concerning for possible residuals in pharynx spilling into open airway   Thin Liquid Thin Liquid: Not tested    Nectar Thick Nectar Thick Liquid: Not tested   Honey Thick Honey Thick Liquid: Not tested   Puree Puree: Not tested   Solid   GO   Solid: Not tested    Functional Assessment Tool Used: clinical judgement Functional Limitations: Swallowing Swallow Current Status (S8546): At least 80 percent but less than 100 percent impaired, limited or restricted Swallow Goal Status [phone]): At least 60 percent but less than 80 percent impaired, limited or restricted   Claudie Fisherman, [facility] SLP [phone]

## 2016-06-20 NOTE — Progress Notes (Signed)
STROKE TEAM PROGRESS NOTE   SUBJECTIVE (INTERVAL HISTORY) His family is not at the bedside.   Feels better but yet dizzy and failed swallow eval   OBJECTIVE Temp:  [97.5 F (36.4 C)-98.7 F (37.1 C)] 97.9 F (36.6 C) (03/20 2130) Pulse Rate:  [49-60] 55 (03/20 2130) Cardiac Rhythm: Sinus bradycardia (03/20 1900) Resp:  [18-20] 20 (03/20 2130) BP: (133-150)/(66-76) 142/66 (03/20 2130) SpO2:  [98 %-100 %] 100 % (03/20 2130)  CBC:   Recent Labs Lab 06/19/16 0223 06/19/16 0233  WBC 10.0  --   NEUTROABS 6.1  --   HGB 14.6 14.6  HCT 44.4 43.0  MCV 87.7  --   PLT 183  --     Basic Metabolic Panel:   Recent Labs Lab 06/19/16 0223 06/19/16 0233  NA 139 141  K 3.8 3.2*  CL 104 104  CO2 24  --   GLUCOSE 113* 115*  BUN 14 15  CREATININE 1.07 1.00  CALCIUM 8.2*  --    HgbA1c:  Lab Results  Component Value Date   HGBA1C 5.8 (H) 06/19/2016   Urine Drug Screen: No results found for: LABOPIA, COCAINSCRNUR, LABBENZ, AMPHETMU, THCU, [place] elderly Caucasian male currently not in distress . Afebrile. Head is nontraumatic. Neck is supple without bruit.    Cardiac exam no murmur or gallop. Lungs are clear to auscultation. Distal pulses are well felt.. Neurological Exam :  Awake alert oriented x3. No dysarthria. No aphasia.fundi not visualized. Patient has photophobia due to severe vertigo and can barely keep his eyes open. Pupils equal reactive. Face is symmetric without weakness. Tongue is midline. Motor system exam shows no extremity drift. No focal weakness. Finger-to-nose coordination slightly impaired on the right and normal on the left. knee to heel coordination is normal.sensation is preserved bilaterally. Deep tendon reflexes are symmetric  Plantars are downgoing. Gait not tested. ASSESSMENT/PLAN Mr. AZIM GILLINGHAM is a 71 y.o. male with history of AFIB and PE on Eliquis, CHF, HTN, pericardial effusion s/p window, OSA on CAPA presenting with  ataxia and HA. He did not receive IV t-PA due to resolved symptoms, on eliquis.   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   Resultant  Diplopia, dizziness  Code Stroke CT no acute abnormality. Aspects 10  CTA head ane neck L VA occlusion vs dissection (poor quality)  MRI  L medullary infarct. L V4 abnormality. L pontine remote microhemorrhage  2D Echo  pending   LDL 77  HgbA1c pending  eliquis for VTE prophylaxis Diet NPO time specified Except for: Sips with Meds  aspirin 81 mg daily and Eliquis (apixaban) daily prior to admission (he did not take last night), now on aspirin 81 mg daily and Eliquis (apixaban) daily  Give 500 cc NS bolus. HOB flat. Keep pt flat until am.  Discussed possible neurointervention, but feel risk may outweigh benefit. Dr. Leonie Man will discuss further with DR. Devewshwar. - for now, not a candidate. Will consider if pt has additional neuro worsening.  Therapy recommendations:  Pending. Keep flat until am Disposition:  pending   Atrial Fibrillation  Home anticoagulation:  aspirin 81 mg daily and Eliquis (apixaban) daily continued in the hospital    Hypertension  Stable  Permissive hypertension (OK if < 220/120) but gradually normalize in 5-7 days  Long-term BP goal normotensive  Hyperlipidemia  Home meds:  lipitor 40, resumed in hospital  LDL  77, just above goal  Continue statin at discharge  Diabetes  HgbA1c goal < 7.0  Other Stroke Risk Factors  Advanced age  Former Cigarette smoker  ETOH use  Morbid Obesity, Body mass index is 48.56 kg/m.  Obstructive sleep apnea, on CPAP at home  Chronic diastolic CHF  Hospital day # [place] for Pager information 06/20/2016 9:45 PM  I have personally examined this patient, reviewed notes, independently viewed imaging studies, participated in medical decision making  and plan of care.ROS completed by me personally and pertinent positives fully documented  I have made any additions or clarifications directly to the above note. Agree with note above. He presented with left medullary infarct from the left vertebral artery occlusion likely embolic from atrial fibrillation despite being on Eliquis anticoagulation. He has mild neurological worsening since admission. Recommend continue medical management.No need for emergent endovascular treatment at the present time. Continue medical management. Aspirin 150 mg rectally and iv heparin drip as patient unable to swallow yet.Discussed with  Dr Wyona Almas, wife and son and answered questions.greater than 50% time during this 25 minute visit was spent on counseling and coordination of care about his stroke and planning treatment Antony Contras, [place] Pager: [phone] 06/20/2016 9:45 PM  To contact Stroke Continuity provider, please refer to [email or web address] After hours, contact General Neurology

## 2016-06-20 NOTE — Progress Notes (Addendum)
PROGRESS NOTE                                                                                                                                                                                                             Patient Demographics:    [name], is a 71 y.o. male, DOB - 31-Oct-1945, ION:[id number]  Admit date - 06/19/2016   Admitting Physician Edwin Dada, MD  Outpatient Primary MD for the patient is [name], MD  LOS - 0  Chief Complaint  Patient presents with  . Code Stroke    LSN [id number]       Brief Narrative  [name] is a 71 y.o. male with a past medical history significant for Afib and PE on Eliquis, HFpEF, HTN, hx of pericardial effusion s/p window, and hx of OSA on CPAP who presents with acute headache and ataxia. His workup showed possible left medullary small acute infarct along with possible left vertebral artery occlusion.     Subjective:    [name] today has, No headache, No chest pain, No abdominal pain - No Nausea, No new weakness tingling or numbness, No Cough - SOB, has some trouble swallowing  .   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 Echo is unremarkable, carotid angiogram also noted.  Lab Results  Component Value Date   HGBA1C 5.8 (H) 06/19/2016   Lab Results  Component Value Date   CHOL 124 06/19/2016   HDL 32 (L) 06/19/2016   LDLCALC 77 06/19/2016   LDLDIRECT 168.0 10/20/2015   TRIG 74  06/19/2016   CHOLHDL 3.9 06/19/2016    2. Dysphagia. Likely due to #1 above. Nothing by mouth, speech following.  3. History of PE. Continue anticoagulation.  4. Hypertension. Allow for permissive hypertension, as needed hydralazine.  5. BPH. Continue Proscar once taking oral medications.  6. GERD. On PPI, currently IV.  7. Paroxysmal atrial fibrillation. [place] vasc 2 score of at least 4. Continue anticoagulation and for now as needed IV Cardizem, flecainide held as he is nothing by mouth, telemetry.  8. Mild acute on chronic diastolic CHF last EF 60 - 65 %. Lasix IV gentle, repeat echo and monitor.  9. Hypokalemia. Replaced and will monitor.  10. Mood disorder. Continue home medications unchanged.  11. Morbid obesity with obstructive sleep apnea. Follow with PCP for weight loss, CPAP daily at bedtime.    Diet : Diet NPO time specified Except for: Sips with Meds    Family Communication  :  wife  Code Status :  Full  Disposition Plan  :  CIR vs SNF  Consults  :  Neuro  Procedures  :    CT-MRI Brain, CTA Head and Neck - Possible small left medical infarct along with possible left vertebral artery occlusion.  TTE - Left ventricle: The cavity size was moderately dilated. Wall thickness was increased in a pattern of mild LVH. Systolic function was normal. The estimated ejection fraction was in the range of 60% to 65%. Wall motion was normal; there were no regional wall motion abnormalities. Doppler parameters are consistent with abnormal left ventricular relaxation (grade 1 diastolic dysfunction). Doppler parameters are consistent with indeterminate ventricular filling pressure. - Aortic valve: Transvalvular velocity was within the normal range. There was no stenosis. There was no regurgitation. - Mitral valve: Transvalvular velocity was within the normal range. There was no evidence for stenosis. There was no regurgitation. - Left atrium: The atrium was mildly dilated. - Right  ventricle: The cavity size was normal. Wall thickness was normal. Systolic function was normal. - Right atrium: The atrium was mildly dilated. - Tricuspid valve: There was trivial regurgitation.   DVT Prophylaxis  :  Eliquis  Lab Results  Component Value Date   PLT 183 06/19/2016    Inpatient Medications  Scheduled Meds: .  stroke: mapping our early stages of recovery book   Does not apply Once  . aspirin  150 mg Rectal Daily  . atorvastatin  80 mg Oral q1800  . finasteride  5 mg Oral Daily  . flecainide  50 mg Oral Q12H  . [START ON 06/21/2016] furosemide  20 mg Intravenous Daily  . mometasone-formoterol  2 puff Inhalation BID  . pantoprazole (PROTONIX) IV  40 mg Intravenous QHS  . risperiDONE  0.5 mg Oral QHS  . sertraline  150 mg Oral Daily  . sodium chloride flush  3 mL Intravenous Q12H   Continuous Infusions: . sodium chloride     PRN Meds:.acetaminophen, diltiazem, hydrALAZINE, LORazepam, [DISCONTINUED] ondansetron **OR** ondansetron (ZOFRAN) IV  Antibiotics  :    Anti-infectives    None         Objective:   Vitals:   06/19/16 2044 06/20/16 0100 06/20/16 0521 06/20/16 0923  BP: (!) 146/75 (!) 150/76 133/69 (!) 148/66  Pulse: 72 60 (!) 59 (!) 49  Resp: 18 18 20 20   Temp: 98.8 F (37.1 C) 98.7 F (37.1 C) 98.4 F (36.9 C) 97.5 F (36.4 C)  TempSrc: Oral Oral Oral Oral  SpO2: 97% 99% 98% 100%  Weight:        Wt Readings from Last 3 Encounters:  06/19/16 (S) (!) 162.4 kg (358 lb 0.4 oz)  06/08/16 (!) 158.1 kg (348 lb 9.6 oz)  05/23/16 (!) 160.3 kg (353 lb 6.4 oz)     Intake/Output Summary (Last 24  hours) at 06/20/16 1105 Last data filed at 06/20/16 0742  Gross per 24 hour  Intake                0 ml  Output             1475 ml  Net            -1475 ml     Physical Exam  Awake Alert, Oriented X 3, No new F.N deficits, Normal affect [place].AT,PERRAL Supple Neck,No JVD, No cervical lymphadenopathy appriciated.  Symmetrical Chest wall movement,  Good air movement bilaterally, CTAB RRR,No Gallops,Rubs or new Murmurs, No Parasternal Heave +ve B.Sounds, Abd Soft, No tenderness, No organomegaly appriciated, No rebound - guarding or rigidity. No Cyanosis, Clubbing or edema, No new Rash or bruise       Data Review:    CBC  Recent Labs Lab 06/19/16 0223 06/19/16 0233  WBC 10.0  --   HGB 14.6 14.6  HCT 44.4 43.0  PLT 183  --   MCV 87.7  --   MCH 28.9  --   MCHC 32.9  --   RDW 14.1  --   LYMPHSABS 2.6  --   MONOABS 1.1*  --   EOSABS 0.2  --   BASOSABS 0.1  --     Chemistries   Recent Labs Lab 06/19/16 0223 06/19/16 0233  NA 139 141  K 3.8 3.2*  CL 104 104  CO2 24  --   GLUCOSE 113* 115*  BUN 14 15  CREATININE 1.07 1.00  CALCIUM 8.2*  --   AST 33  --   ALT 21  --   ALKPHOS 86  --   BILITOT 0.9  --    ------------------------------------------------------------------------------------------------------------------  Recent Labs  06/19/16 0730  CHOL 124  HDL 32*  LDLCALC 77  TRIG 74  CHOLHDL 3.9    Lab Results  Component Value Date   HGBA1C 5.8 (H) 06/19/2016   ------------------------------------------------------------------------------------------------------------------ No results for input(s): TSH, T4TOTAL, T3FREE, THYROIDAB in the last 72 hours.  Invalid input(s): FREET3 ------------------------------------------------------------------------------------------------------------------ No results for input(s): VITAMINB12, FOLATE, FERRITIN, TIBC, IRON, RETICCTPCT in the last 72 hours.  Coagulation profile  Recent Labs Lab 06/19/16 0223  INR 1.09    No results for input(s): DDIMER in the last 72 hours.  Cardiac Enzymes No results for input(s): CKMB, TROPONINI, MYOGLOBIN in the last 168 hours.  Invalid input(s): CK ------------------------------------------------------------------------------------------------------------------    Component Value Date/Time   BNP 88.3 06/14/2015 0345     Micro Results No results found for this or any previous visit (from the past 240 hour(s)).  Radiology Reports Ct Angio Head W Or Wo Contrast  Result Date: 06/19/2016 CLINICAL DATA:  Left-sided weakness.  Concern for dissection. EXAM: CT ANGIOGRAPHY HEAD AND NECK TECHNIQUE: Multidetector CT imaging of the head and neck was performed using the standard protocol during bolus administration of intravenous contrast. Multiplanar CT image reconstructions and MIPs were obtained to evaluate the vascular anatomy. Carotid stenosis measurements (when applicable) are obtained utilizing NASCET criteria, using the distal internal carotid diameter as the denominator. CONTRAST:  50 mL Isovue 370 IV COMPARISON:  Head CT 06/19/2016 FINDINGS: The examination is degraded by poor contrast bolus. CTA NECK FINDINGS Aortic arch: There is no aneurysm or dissection of the visualized ascending aorta or aortic arch. There is an average right subclavian artery. The brachiocephalic and left common carotid arteries share a common origin. The visualized proximal subclavian arteries are normal. There is calcific aortic atherosclerosis.  Right carotid system: There is no common carotid or internal carotid artery dissection or aneurysm. No hemodynamically significant stenosis. Left carotid system: There is no common carotid or internal carotid artery dissection or aneurysm. Minimal bifurcation atherosclerosis without hemodynamically significant stenosis. Vertebral arteries: The vertebral system is left dominant. Assessment of the vertebral artery origins and proximal V2 segments is severely degraded by poor signal to noise ratio. There is abrupt termination of the left vertebral artery at the junction of the V3 and V4 segments, at the skullbase (sagittal images 166). Skeleton: There is no bony spinal canal stenosis. No lytic or blastic lesions. Other neck: The nasopharynx is clear. The oropharynx and hypopharynx are normal. The epiglottis  is normal. The supraglottic larynx, glottis and subglottic larynx are normal. No retropharyngeal collection. The parapharyngeal spaces are preserved. The parotid and submandibular glands are normal. No sialolithiasis or salivary ductal dilatation. The thyroid gland is normal. There is no cervical lymphadenopathy. Upper chest: No pneumothorax or pleural effusion. No nodules or masses. Review of the MIP images confirms the above findings CTA HEAD FINDINGS Anterior circulation: --Intracranial internal carotid arteries: Normal. --Anterior cerebral arteries: Normal. --Middle cerebral arteries: Normal proximally. The poor contrast bolus degrades assessment of the distal circulation. --Posterior communicating arteries: Present on the right. Posterior circulation: --Posterior cerebral arteries: Normal proximally. Poor contrast bolus degrades distal assessment. --Superior cerebellar arteries: Normal on the right. Not clearly visualized on the left. --Basilar artery: Normal. --Anterior inferior cerebellar arteries: Normal. --Posterior inferior cerebellar arteries: Normal on the right. Poorly opacified on the left. Venous sinuses: As permitted by contrast timing, patent. Anatomic variants: None Delayed phase: No parenchymal contrast enhancement. Review of the MIP images confirms the above findings IMPRESSION: 1. Examination degraded by poor contrast bolus. 2. 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 This suggests acute dissection, though occlusive thrombus could cause the same appearance. 3. No proximal occlusion of the intracranial arteries. 4. No hemodynamically significant stenosis or dissection of the carotid arteries. These results were called by telephone at the time of interpretation on 06/19/2016 at 3:15 am to Dr. Roland Rack , who verbally acknowledged these results. Electronically Signed   By: Ulyses Jarred M.D.   On: 06/19/2016 03:25   Dg Chest 2 View  Result Date:  06/19/2016 CLINICAL DATA:  SOB,WEAKNESS ON LEFT,HX HTN,CHF,ASTHMA EXAM: CHEST  2 VIEW COMPARISON:  06/13/2015; chest CT - 06/13/2015 FINDINGS: Grossly unchanged enlarged cardiac silhouette and mediastinal contours. Grossly unchanged minimal bibasilar linear heterogeneous opacities, left greater than right, favored to represent atelectasis or scar. Resolution of previously noted heterogeneous opacity with the peripheral aspect the right mid lung. No focal airspace opacities. No definite pleural effusion, though note, the costophrenic angles are excluded on the provided lateral radiograph. No pneumothorax. No evidence of edema. No acute osseus abnormalities. IMPRESSION: Bibasilar atelectasis/scar without superimposed acute cardiopulmonary disease. Electronically Signed   By: Sandi Mariscal M.D.   On: 06/19/2016 09:07   Ct Angio Neck W Or Wo Contrast  Result Date: 06/19/2016 CLINICAL DATA:  Left-sided weakness.  Concern for dissection. EXAM: CT ANGIOGRAPHY HEAD AND NECK TECHNIQUE: Multidetector CT imaging of the head and neck was performed using the standard protocol during bolus administration of intravenous contrast. Multiplanar CT image reconstructions and MIPs were obtained to evaluate the vascular anatomy. Carotid stenosis measurements (when applicable) are obtained utilizing NASCET criteria, using the distal internal carotid diameter as the denominator. CONTRAST:  50 mL Isovue 370 IV COMPARISON:  Head CT 06/19/2016 FINDINGS: The examination  is degraded by poor contrast bolus. CTA NECK FINDINGS Aortic arch: There is no aneurysm or dissection of the visualized ascending aorta or aortic arch. There is an average right subclavian artery. The brachiocephalic and left common carotid arteries share a common origin. The visualized proximal subclavian arteries are normal. There is calcific aortic atherosclerosis. Right carotid system: There is no common carotid or internal carotid artery dissection or aneurysm. No  hemodynamically significant stenosis. Left carotid system: There is no common carotid or internal carotid artery dissection or aneurysm. Minimal bifurcation atherosclerosis without hemodynamically significant stenosis. Vertebral arteries: The vertebral system is left dominant. Assessment of the vertebral artery origins and proximal V2 segments is severely degraded by poor signal to noise ratio. There is abrupt termination of the left vertebral artery at the junction of the V3 and V4 segments, at the skullbase (sagittal images 166). Skeleton: There is no bony spinal canal stenosis. No lytic or blastic lesions. Other neck: The nasopharynx is clear. The oropharynx and hypopharynx are normal. The epiglottis is normal. The supraglottic larynx, glottis and subglottic larynx are normal. No retropharyngeal collection. The parapharyngeal spaces are preserved. The parotid and submandibular glands are normal. No sialolithiasis or salivary ductal dilatation. The thyroid gland is normal. There is no cervical lymphadenopathy. Upper chest: No pneumothorax or pleural effusion. No nodules or masses. Review of the MIP images confirms the above findings CTA HEAD FINDINGS Anterior circulation: --Intracranial internal carotid arteries: Normal. --Anterior cerebral arteries: Normal. --Middle cerebral arteries: Normal proximally. The poor contrast bolus degrades assessment of the distal circulation. --Posterior communicating arteries: Present on the right. Posterior circulation: --Posterior cerebral arteries: Normal proximally. Poor contrast bolus degrades distal assessment. --Superior cerebellar arteries: Normal on the right. Not clearly visualized on the left. --Basilar artery: Normal. --Anterior inferior cerebellar arteries: Normal. --Posterior inferior cerebellar arteries: Normal on the right. Poorly opacified on the left. Venous sinuses: As permitted by contrast timing, patent. Anatomic variants: None Delayed phase: No parenchymal  contrast enhancement. Review of the MIP images confirms the above findings IMPRESSION: 1. Examination degraded by poor contrast bolus. 2. 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 This suggests acute dissection, though occlusive thrombus could cause the same appearance. 3. No proximal occlusion of the intracranial arteries. 4. No hemodynamically significant stenosis or dissection of the carotid arteries. These results were called by telephone at the time of interpretation on 06/19/2016 at 3:15 am to Dr. Roland Rack , who verbally acknowledged these results. Electronically Signed   By: Ulyses Jarred M.D.   On: 06/19/2016 03:25   Mr Brain Wo Contrast  Result Date: 06/19/2016 CLINICAL DATA:  Left retro-orbital headache and sensation of staggering to the left or leaning. EXAM: MRI HEAD WITHOUT CONTRAST TECHNIQUE: Multiplanar, multiecho pulse sequences of the brain and surrounding structures were obtained without intravenous contrast. COMPARISON:  Head CT 06/19/2016 Brain MRI 12/16/2013 FINDINGS: Brain: There is a subtle focus of reduced diffusion within the left aspect of the medulla (series 5, image 10, series 6, image 15). The finding is duplicated on the coronal diffusion weighted imaging, which argues against the possibility that it is an artifact. The brain parenchymal signal is otherwise normal. No mass lesion or midline shift. No hydrocephalus or extra-axial fluid collection. The midline structures are normal. No age advanced or lobar predominant atrophy. Vascular: There is loss of the normal flow void within knee left vertebral artery. The other major intracranial flow voids are preserved. Remote microhemorrhage in the left pons. Skull and upper cervical  spine: The visualized skull base, calvarium, upper cervical spine and extracranial soft tissues are normal. Sinuses/Orbits: No fluid levels or advanced mucosal thickening. No mastoid effusion. Normal orbits. IMPRESSION: 1.  Faint focus of reduced diffusion within the left aspect of the medulla, in the region of the gracile fasciculus, concerning for a small acute infarct, particularly in the context of the reported symptoms. 2. Abnormal signal within the V4 segment of the left vertebral artery, compatible with occlusion seen on earlier CTA. 3. Single focus of remote microhemorrhage in the left pons but no acute intracranial hemorrhage. Electronically Signed   By: Ulyses Jarred M.D.   On: 06/19/2016 06:31   Ct Head Code Stroke W/o Cm  Result Date: 06/19/2016 CLINICAL DATA:  Code stroke.  Left-sided weakness EXAM: CT HEAD WITHOUT CONTRAST TECHNIQUE: Contiguous axial images were obtained from the base of the skull through the vertex without intravenous contrast. COMPARISON:  None. FINDINGS: Brain: No mass lesion, intraparenchymal hemorrhage or extra-axial collection. No evidence of acute cortical infarct. Brain parenchyma and CSF-containing spaces are normal for age. Vascular: No hyperdense vessel or unexpected calcification. Skull: Normal visualized skull base, calvarium and extracranial soft tissues. Sinuses/Orbits: No sinus fluid levels or advanced mucosal thickening. No mastoid effusion. Normal orbits. ASPECTS [facility] Stroke Program Early CT Score) - Ganglionic level infarction (caudate, lentiform nuclei, internal capsule, insula, M1-M3 cortex): 7 - Supraganglionic infarction (M4-M6 cortex): 3 Total score (0-10 with 10 being normal): 10 IMPRESSION: 1. No acute intracranial abnormality. 2. ASPECTS is 10. These results were called by telephone at the time of interpretation on 06/19/2016 at 2:42 am to Dr. Roland Rack, who verbally acknowledged these results. Electronically Signed   By: Ulyses Jarred M.D.   On: 06/19/2016 02:42    Time Spent in minutes  30   Freddi Schrager K M.D on 06/20/2016 at 11:05 AM  Between 7am to 7pm - Pager - [phone] ( page via [facility], text pages only, please mention full 10 digit call back  number).  After 7pm go to [email or web address] - password [email or web address]  [facility] -  Office  [phone]

## 2016-06-20 NOTE — Consult Note (Signed)
Physical Medicine and Rehabilitation Consult Reason for Consult: Left medullary infarct Referring Physician: [facility]   HPI: [name] is a 71 y.o. right handed male with history of atrial fibrillation on pulmonary emboli maintained on Eliquis as well as low-dose aspirin, hypertension, history of pericardial effusion s/p window, OSA on CPAP, diastolic congestive heart failure, bilateral TKA, morbid obesity with gastric banding procedure. Per chart review, patient, and family, patient lives with spouse. Independent prior to admission using a cane and walker at times due to history of knee replacement. One level home with 7 steps to entry. Wife with limited physical assist with history of CABG. Son in the area works. Presented 06/19/2016 with headache of sudden onset while watching television and lean to the left side as well as slurred speech. Cranial CT scan negative. Patient did not receive TPA. 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 No proximal occlusion of the intracranial arteries. No significant stenosis or dissection of the carotid arteries. MRI reviewed, showing small left medullary infarct. Echocardiogram with ejection fraction 14% grade 1 diastolic dysfunction. Neurology services consult infarct felt to be embolic secondary to known atrial fibrillation and transition from Eliquis to heparin ggt as well as aspirin. Modified barium swallow planned for 06/20/2016. Physical therapy evaluation completed with recommendations of physical medicine rehabilitation consult.   Review of Systems  Constitutional: Negative for chills and fever.  HENT: Negative for hearing loss and tinnitus.   Eyes: Positive for double vision. Negative for blurred vision.  Respiratory: Positive for shortness of breath. Negative for cough.   Cardiovascular: Positive for leg swelling. Negative for chest pain and palpitations.  Gastrointestinal: Positive  for constipation and nausea. Negative for vomiting.  Genitourinary: Positive for urgency. Negative for dysuria and hematuria.  Musculoskeletal: Positive for joint pain and myalgias.  Skin: Negative for rash.  Neurological: Positive for dizziness, sensory change, speech change, weakness and headaches. Negative for seizures.  Psychiatric/Behavioral: Positive for depression.  All other systems reviewed and are negative.  Past Medical History:  Diagnosis Date  . Anemia 07/29/2013  . Asthma    childhood asthma  . Benign prostatic hypertrophy    several prostate biopsies neg for cancer.   . Bilateral hip pain   . Cancer ([place])   . CHF (congestive heart failure) ([place])   . Complication of anesthesia    MORPHINE CAUSES HALLUCINATIONS  . Depression   . FH: colonic polyps 2010  . Gout   . History of kidney stones   . Hypertension   . Kidney cysts   . Morbid obesity ([place])   . Numbness    feet / legs  . PAF (paroxysmal atrial fibrillation) with RVR 07/29/2013  . Sleep apnea    USES C PAP   Past Surgical History:  Procedure Laterality Date  . ARTHROSCOPY KNEE W/ DRILLING    . BREATH TEK H PYLORI N/A 12/23/2012   Procedure: BREATH TEK H PYLORI;  Surgeon: Gayland Curry, MD;  Location: [facility];  Service: General;  Laterality: N/A;  . CHOLECYSTECTOMY  1989  . COLONOSCOPY N/A 07/31/2013   Procedure: COLONOSCOPY;  Surgeon: Gatha Mayer, MD;  Location: [place];  Service: Endoscopy;  Laterality: N/A;  . ESOPHAGOGASTRODUODENOSCOPY N/A 07/22/2013   Procedure: ESOPHAGOGASTRODUODENOSCOPY (EGD);  Surgeon: Irene Shipper, MD;  Location: [facility];  Service: Endoscopy;  Laterality: N/A;  . ESOPHAGOGASTRODUODENOSCOPY N/A 07/31/2013   Procedure: ESOPHAGOGASTRODUODENOSCOPY (EGD);  Surgeon: Ofilia Neas  Carlean Purl, MD;  Location: [place];  Service: Endoscopy;  Laterality: N/A;  . INTRAOPERATIVE TRANSESOPHAGEAL ECHOCARDIOGRAM N/A 07/18/2013   Procedure: INTRAOPERATIVE TRANSESOPHAGEAL ECHOCARDIOGRAM;   Surgeon: Grace Isaac, MD;  Location: [place];  Service: Open Heart Surgery;  Laterality: N/A;  . LAPAROSCOPIC GASTRIC BANDING WITH HIATAL HERNIA REPAIR N/A 04/08/2013   Procedure: LAPAROSCOPIC GASTRIC BANDING WITH possible  HIATAL HERNIA REPAIR;  Surgeon: Gayland Curry, MD;  Location: [facility];  Service: General;  Laterality: N/A;  . LITHOTRIPSY    . renal calculi    . SUBXYPHOID PERICARDIAL WINDOW N/A 07/18/2013   Procedure: SUBXYPHOID PERICARDIAL WINDOW;  Surgeon: Grace Isaac, MD;  Location: [place];  Service: Thoracic;  Laterality: N/A;  . TONSILLECTOMY    . total knee replacment     bilat  . trigger finger repair     also lue, cts surgically repaired  . WISDOM TOOTH EXTRACTION     Family History  Problem Relation Age of Onset  . Depression Mother   . Hypertension Mother   . Heart disease Father     MI  . Depression Brother   . Stroke Paternal Aunt     CVA  . Diabetes Paternal Uncle   . Heart disease Paternal Uncle     MI  . Heart disease Paternal Grandmother     MI  . Diabetes Cousin     PATERNAL   Social History:  reports that he quit smoking about 41 years ago. His smoking use included Cigarettes. He started smoking about 55 years ago. He has a 2.60 pack-year smoking history. He has never used smokeless tobacco. He reports that he drinks alcohol. He reports that he does not use drugs. Allergies:  Allergies  Allergen Reactions  . Morphine Other (See Comments)    Nausea and Severe hallucinations  . Codeine Nausea Only  . Penicillins Other (See Comments)    LOC with shot  . Propoxyphene N-Acetaminophen Nausea Only   Medications Prior to Admission  Medication Sig Dispense Refill  . amLODipine-benazepril (LOTREL) 5-10 MG capsule Take 1 capsule by mouth daily. 30 capsule 5  . apixaban (ELIQUIS) 5 MG TABS tablet Take 1 tablet (5 mg total) by mouth 2 (two) times daily. 180 tablet 3  . aspirin 81 MG tablet Take 81 mg by mouth 2 (two) times daily.     [name] atorvastatin  (LIPITOR) 40 MG tablet Take 1 tablet (40 mg total) by mouth daily. 90 tablet 0  . budesonide-formoterol (SYMBICORT) 160-4.5 MCG/ACT inhaler Inhale 2 puffs into the lungs 2 (two) times daily as needed (shortness of breath).    . CALCIUM PO Take 500 mg by mouth 3 (three) times daily.     . Cyanocobalamin (VITAMIN B-12) 1000 MCG SUBL Place 1,000 mcg under the tongue every evening.     [name] Sodium (DSS) 100 MG CAPS Take 100 mg by mouth every other day.    . finasteride (PROSCAR) 5 MG tablet Take 5 mg by mouth daily.     . flecainide (TAMBOCOR) 50 MG tablet TAKE ONE TABLET BY MOUTH EVERY 12 HOURS 180 tablet 2  . furosemide (LASIX) 20 MG tablet TAKE 1 TAB ONCE A DAY AS NEEDED FOR SWELLING IN THE LEGS (Patient taking differently: TAKE 1 TAB ONCE A DAY FOR SWELLING IN THE LEGS) 90 tablet 1  . loratadine (CLARITIN) 10 MG tablet Take 10 mg by mouth daily.    [name] LORazepam (ATIVAN) 0.5 MG tablet TAKE ONE TABLET BY MOUTH ONCE  DAILY IN THE MORNING AND TWO AT BEDTIME 90 tablet 2  . montelukast (SINGULAIR) 10 MG tablet TAKE 1 TABLET BY MOUTH ONCE A DAY AT BEDTIME 30 tablet 1  . naphazoline-pheniramine (NAPHCON-A) 0.025-0.3 % ophthalmic solution Place 1 drop into both eyes 4 (four) times daily as needed for irritation.    . pantoprazole (PROTONIX) 40 MG tablet Take 1 tablet (40 mg total) by mouth 2 (two) times daily. 180 tablet 0  . risperiDONE (RISPERDAL) 0.5 MG tablet Take 1 tablet (0.5 mg total) by mouth at bedtime. 30 tablet 2  . rOPINIRole (REQUIP) 0.25 MG tablet TAKE THREE TABLETS BY MOUTH AT BEDTIME OR  AS  DIRECTED  FOR  LEG  JERKS 90 tablet 11  . sertraline (ZOLOFT) 100 MG tablet TAKE ONE AND ONE-HALF TABLETS BY MOUTH ONCE DAILY WITH  BREAKFAST 45 tablet 2    Home: Home Living Family/patient expects to be discharged to:: Private residence Living Arrangements: Spouse/significant other Available Help at Discharge: Family Type of Home: House Home Access: Stairs to enter Technical brewer of  Steps: 7 Entrance Stairs-Rails: Left, Right Home Layout: One level Bathroom Shower/Tub: Chiropodist: Standard Home Equipment: Environmental consultant - 2 wheels, [employer] - quad  Functional History: Prior Function Level of Independence: Independent Functional Status:  Mobility: Bed Mobility Overal bed mobility: Needs Assistance Bed Mobility: Supine to Sit, Sit to Supine Supine to sit: Min assist Sit to supine: Mod assist General bed mobility comments: pt requires incr time and effort to exit bed on the R side. pt needed help with power up from supine. pt requires (A) for BIL LE and to sequence return to supine Transfers Overall transfer level: Needs assistance Equipment used: 2 person hand held assist Transfers: Sit to/from Stand Sit to Stand: +2 physical assistance, Mod assist General transfer comment: pt requires (A) once in standing due to immediate lob to the L side. pt requires total +2 mod to max in static standing. pt with chair positioned for BIL UE to help with center while in static standing Ambulation/Gait Ambulation/Gait assistance: Mod assist, +2 physical assistance Ambulation Distance (Feet):  (3 side steps to [facility]) Assistive device: 2 person hand held assist General Gait Details: limited to side stepping to HOB, pt with strong lean to the L, dificulty with raising L LE and impaired proprioception Gait velocity: slow Gait velocity interpretation: Below normal speed for age/gender    ADL: ADL Overall ADL's : Needs assistance/impaired Eating/Feeding Details (indicate cue type and reason): pt reports change in voice quality and question need for SLP Grooming: Wash/dry hands, Min guard Upper Body Bathing: Minimal assistance Lower Body Bathing: Maximal assistance Lower Body Bathing Details (indicate cue type and reason): pt requires (A) for balance due to L lean General ADL Comments: pt static sitting with strong L lean. pt requires (A) for upright posture. Pt states I  can't hold it there  Cognition: Cognition Overall Cognitive Status: Within Functional Limits for tasks assessed Orientation Level: Oriented X4 Cognition Arousal/Alertness: Awake/alert Behavior During Therapy: WFL for tasks assessed/performed Overall Cognitive Status: Within Functional Limits for tasks assessed  Blood pressure 133/69, pulse (!) 59, temperature 98.4 F (36.9 C), temperature source Oral, resp. rate 20, weight (S) (!) 162.4 kg (358 lb 0.4 oz), SpO2 98 %. Physical Exam  Vitals reviewed. Constitutional: He is oriented to person, place, and time. He appears well-developed.  71 year old right-handed obese white male  HENT:  Head: Normocephalic and atraumatic.  Eyes: Conjunctivae and EOM are normal.  Neck: Normal  range of motion. Neck supple. No thyromegaly present.  Cardiovascular: Normal rate and regular rhythm.   Respiratory: Effort normal and breath sounds normal.  +[place]  GI: Soft. Bowel sounds are normal. He exhibits no distension.  Musculoskeletal: He exhibits edema and tenderness.  Neurological: He is alert and oriented to person, place, and time.  Speech is a bit dysarthric but fully intelligible.  Awareness of deficits.  Follows full commands Sensation intact to light touch DTRs symmetric Motor: RUE/RLE 5/5 LUE/LLE: 4+/5 with mild ataxia  Skin: Skin is warm and dry.  Psychiatric: He has a normal mood and affect. His behavior is normal.    Results for orders placed or performed during the hospital encounter of 06/19/16 (from the past 24 hour(s))  Lipid panel     Status: Abnormal   Collection Time: 06/19/16  7:30 AM  Result Value Ref Range   Cholesterol 124 0 - 200 mg/dL   Triglycerides 74 <150 mg/dL   HDL 32 (L) >40 mg/dL   Total CHOL/HDL Ratio 3.9 RATIO   VLDL 15 0 - 40 mg/dL   LDL Cholesterol 77 0 - 99 mg/dL  Hemoglobin A1c     Status: Abnormal   Collection Time: 06/19/16  7:32 AM  Result Value Ref Range   Hgb A1c MFr Bld 5.8 (H) 4.8 - 5.6 %   Mean  Plasma Glucose 120 mg/dL   Ct Angio Head W Or Wo Contrast  Result Date: 06/19/2016 CLINICAL DATA:  Left-sided weakness.  Concern for dissection. EXAM: CT ANGIOGRAPHY HEAD AND NECK TECHNIQUE: Multidetector CT imaging of the head and neck was performed using the standard protocol during bolus administration of intravenous contrast. Multiplanar CT image reconstructions and MIPs were obtained to evaluate the vascular anatomy. Carotid stenosis measurements (when applicable) are obtained utilizing NASCET criteria, using the distal internal carotid diameter as the denominator. CONTRAST:  50 mL Isovue 370 IV COMPARISON:  Head CT 06/19/2016 FINDINGS: The examination is degraded by poor contrast bolus. CTA NECK FINDINGS Aortic arch: There is no aneurysm or dissection of the visualized ascending aorta or aortic arch. There is an average right subclavian artery. The brachiocephalic and left common carotid arteries share a common origin. The visualized proximal subclavian arteries are normal. There is calcific aortic atherosclerosis. Right carotid system: There is no common carotid or internal carotid artery dissection or aneurysm. No hemodynamically significant stenosis. Left carotid system: There is no common carotid or internal carotid artery dissection or aneurysm. Minimal bifurcation atherosclerosis without hemodynamically significant stenosis. Vertebral arteries: The vertebral system is left dominant. Assessment of the vertebral artery origins and proximal V2 segments is severely degraded by poor signal to noise ratio. There is abrupt termination of the left vertebral artery at the junction of the V3 and V4 segments, at the skullbase (sagittal images 166). Skeleton: There is no bony spinal canal stenosis. No lytic or blastic lesions. Other neck: The nasopharynx is clear. The oropharynx and hypopharynx are normal. The epiglottis is normal. The supraglottic larynx, glottis and subglottic larynx are normal. No  retropharyngeal collection. The parapharyngeal spaces are preserved. The parotid and submandibular glands are normal. No sialolithiasis or salivary ductal dilatation. The thyroid gland is normal. There is no cervical lymphadenopathy. Upper chest: No pneumothorax or pleural effusion. No nodules or masses. Review of the MIP images confirms the above findings CTA HEAD FINDINGS Anterior circulation: --Intracranial internal carotid arteries: Normal. --Anterior cerebral arteries: Normal. --Middle cerebral arteries: Normal proximally. The poor contrast bolus degrades assessment of the distal circulation. --Posterior  communicating arteries: Present on the right. Posterior circulation: --Posterior cerebral arteries: Normal proximally. Poor contrast bolus degrades distal assessment. --Superior cerebellar arteries: Normal on the right. Not clearly visualized on the left. --Basilar artery: Normal. --Anterior inferior cerebellar arteries: Normal. --Posterior inferior cerebellar arteries: Normal on the right. Poorly opacified on the left. Venous sinuses: As permitted by contrast timing, patent. Anatomic variants: None Delayed phase: No parenchymal contrast enhancement. Review of the MIP images confirms the above findings IMPRESSION: 1. Examination degraded by poor contrast bolus. 2. 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 This suggests acute dissection, though occlusive thrombus could cause the same appearance. 3. No proximal occlusion of the intracranial arteries. 4. No hemodynamically significant stenosis or dissection of the carotid arteries. These results were called by telephone at the time of interpretation on 06/19/2016 at 3:15 am to Dr. Roland Rack , who verbally acknowledged these results. Electronically Signed   By: Ulyses Jarred M.D.   On: 06/19/2016 03:25   Dg Chest 2 View  Result Date: 06/19/2016 CLINICAL DATA:  SOB,WEAKNESS ON LEFT,HX HTN,CHF,ASTHMA EXAM: CHEST  2 VIEW  COMPARISON:  06/13/2015; chest CT - 06/13/2015 FINDINGS: Grossly unchanged enlarged cardiac silhouette and mediastinal contours. Grossly unchanged minimal bibasilar linear heterogeneous opacities, left greater than right, favored to represent atelectasis or scar. Resolution of previously noted heterogeneous opacity with the peripheral aspect the right mid lung. No focal airspace opacities. No definite pleural effusion, though note, the costophrenic angles are excluded on the provided lateral radiograph. No pneumothorax. No evidence of edema. No acute osseus abnormalities. IMPRESSION: Bibasilar atelectasis/scar without superimposed acute cardiopulmonary disease. Electronically Signed   By: Sandi Mariscal M.D.   On: 06/19/2016 09:07   Ct Angio Neck W Or Wo Contrast  Result Date: 06/19/2016 CLINICAL DATA:  Left-sided weakness.  Concern for dissection. EXAM: CT ANGIOGRAPHY HEAD AND NECK TECHNIQUE: Multidetector CT imaging of the head and neck was performed using the standard protocol during bolus administration of intravenous contrast. Multiplanar CT image reconstructions and MIPs were obtained to evaluate the vascular anatomy. Carotid stenosis measurements (when applicable) are obtained utilizing NASCET criteria, using the distal internal carotid diameter as the denominator. CONTRAST:  50 mL Isovue 370 IV COMPARISON:  Head CT 06/19/2016 FINDINGS: The examination is degraded by poor contrast bolus. CTA NECK FINDINGS Aortic arch: There is no aneurysm or dissection of the visualized ascending aorta or aortic arch. There is an average right subclavian artery. The brachiocephalic and left common carotid arteries share a common origin. The visualized proximal subclavian arteries are normal. There is calcific aortic atherosclerosis. Right carotid system: There is no common carotid or internal carotid artery dissection or aneurysm. No hemodynamically significant stenosis. Left carotid system: There is no common carotid or  internal carotid artery dissection or aneurysm. Minimal bifurcation atherosclerosis without hemodynamically significant stenosis. Vertebral arteries: The vertebral system is left dominant. Assessment of the vertebral artery origins and proximal V2 segments is severely degraded by poor signal to noise ratio. There is abrupt termination of the left vertebral artery at the junction of the V3 and V4 segments, at the skullbase (sagittal images 166). Skeleton: There is no bony spinal canal stenosis. No lytic or blastic lesions. Other neck: The nasopharynx is clear. The oropharynx and hypopharynx are normal. The epiglottis is normal. The supraglottic larynx, glottis and subglottic larynx are normal. No retropharyngeal collection. The parapharyngeal spaces are preserved. The parotid and submandibular glands are normal. No sialolithiasis or salivary ductal dilatation. The thyroid gland is normal.  There is no cervical lymphadenopathy. Upper chest: No pneumothorax or pleural effusion. No nodules or masses. Review of the MIP images confirms the above findings CTA HEAD FINDINGS Anterior circulation: --Intracranial internal carotid arteries: Normal. --Anterior cerebral arteries: Normal. --Middle cerebral arteries: Normal proximally. The poor contrast bolus degrades assessment of the distal circulation. --Posterior communicating arteries: Present on the right. Posterior circulation: --Posterior cerebral arteries: Normal proximally. Poor contrast bolus degrades distal assessment. --Superior cerebellar arteries: Normal on the right. Not clearly visualized on the left. --Basilar artery: Normal. --Anterior inferior cerebellar arteries: Normal. --Posterior inferior cerebellar arteries: Normal on the right. Poorly opacified on the left. Venous sinuses: As permitted by contrast timing, patent. Anatomic variants: None Delayed phase: No parenchymal contrast enhancement. Review of the MIP images confirms the above findings IMPRESSION: 1.  Examination degraded by poor contrast bolus. 2. 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 This suggests acute dissection, though occlusive thrombus could cause the same appearance. 3. No proximal occlusion of the intracranial arteries. 4. No hemodynamically significant stenosis or dissection of the carotid arteries. These results were called by telephone at the time of interpretation on 06/19/2016 at 3:15 am to Dr. Roland Rack , who verbally acknowledged these results. Electronically Signed   By: Ulyses Jarred M.D.   On: 06/19/2016 03:25   Mr Brain Wo Contrast  Result Date: 06/19/2016 CLINICAL DATA:  Left retro-orbital headache and sensation of staggering to the left or leaning. EXAM: MRI HEAD WITHOUT CONTRAST TECHNIQUE: Multiplanar, multiecho pulse sequences of the brain and surrounding structures were obtained without intravenous contrast. COMPARISON:  Head CT 06/19/2016 Brain MRI 12/16/2013 FINDINGS: Brain: There is a subtle focus of reduced diffusion within the left aspect of the medulla (series 5, image 10, series 6, image 15). The finding is duplicated on the coronal diffusion weighted imaging, which argues against the possibility that it is an artifact. The brain parenchymal signal is otherwise normal. No mass lesion or midline shift. No hydrocephalus or extra-axial fluid collection. The midline structures are normal. No age advanced or lobar predominant atrophy. Vascular: There is loss of the normal flow void within knee left vertebral artery. The other major intracranial flow voids are preserved. Remote microhemorrhage in the left pons. Skull and upper cervical spine: The visualized skull base, calvarium, upper cervical spine and extracranial soft tissues are normal. Sinuses/Orbits: No fluid levels or advanced mucosal thickening. No mastoid effusion. Normal orbits. IMPRESSION: 1. Faint focus of reduced diffusion within the left aspect of the medulla, in the region of  the gracile fasciculus, concerning for a small acute infarct, particularly in the context of the reported symptoms. 2. Abnormal signal within the V4 segment of the left vertebral artery, compatible with occlusion seen on earlier CTA. 3. Single focus of remote microhemorrhage in the left pons but no acute intracranial hemorrhage. Electronically Signed   By: Ulyses Jarred M.D.   On: 06/19/2016 06:31   Ct Head Code Stroke W/o Cm  Result Date: 06/19/2016 CLINICAL DATA:  Code stroke.  Left-sided weakness EXAM: CT HEAD WITHOUT CONTRAST TECHNIQUE: Contiguous axial images were obtained from the base of the skull through the vertex without intravenous contrast. COMPARISON:  None. FINDINGS: Brain: No mass lesion, intraparenchymal hemorrhage or extra-axial collection. No evidence of acute cortical infarct. Brain parenchyma and CSF-containing spaces are normal for age. Vascular: No hyperdense vessel or unexpected calcification. Skull: Normal visualized skull base, calvarium and extracranial soft tissues. Sinuses/Orbits: No sinus fluid levels or advanced mucosal thickening. No mastoid effusion.  Normal orbits. ASPECTS [facility] Stroke Program Early CT Score) - Ganglionic level infarction (caudate, lentiform nuclei, internal capsule, insula, M1-M3 cortex): 7 - Supraganglionic infarction (M4-M6 cortex): 3 Total score (0-10 with 10 being normal): 10 IMPRESSION: 1. No acute intracranial abnormality. 2. ASPECTS is 10. These results were called by telephone at the time of interpretation on 06/19/2016 at 2:42 am to Dr. Roland Rack, who verbally acknowledged these results. Electronically Signed   By: Ulyses Jarred M.D.   On: 06/19/2016 02:42    Assessment/Plan: Diagnosis: Left medullary infarct Labs and images independently reviewed.  Records reviewed and summated above. Stroke: Continue secondary stroke prophylaxis and Risk Factor Modification listed below:   Antiplatelet therapy:   Blood Pressure Management:   Continue current medication with prn's with permisive HTN per primary team Statin Agent:   Prediabetes management:   Left sided hemiparesis  1. Does the need for close, 24 hr/day medical supervision in concert with the patient's rehab needs make it unreasonable for this patient to be served in a less intensive setting? Yes 2. Co-Morbidities requiring supervision/potential complications: atrial fibrillation (cont meds, monitor with increased activity, transition from heparin ggt when appropriate), pulmonary emboli (see previous), HTN (monitor and provide prns in accordance with increased physical exertion and pain), history of pericardial effusion s/p window, OSA (cont CPAP, monitor for daytime somnolence), diastolic congestive heart failure (monitor for signs/symptoms of fluid overload), bilateral TKA, morbid obesity (Body mass index is 48.56 kg/m., diet and exercise education, encourage weight loss to increase endurance and promote overall health), dysphagia (advance diet as tolerated), hypokalemia (continue to monitor and replete as necessary) 3. Due to safety, disease management and patient education, does the patient require 24 hr/day rehab nursing? Yes 4. Does the patient require coordinated care of a physician, rehab nurse, PT (1-2 hrs/day, 5 days/week), OT (1-2 hrs/day, 5 days/week) and SLP (1-2 hrs/day, 5 days/week) to address physical and functional deficits in the context of the above medical diagnosis(es)? Yes Addressing deficits in the following areas: balance, endurance, locomotion, transferring, bathing, dressing, toileting, swallowing and psychosocial support 5. Can the patient actively participate in an intensive therapy program of at least 3 hrs of therapy per day at least 5 days per week? Yes 6. The potential for patient to make measurable gains while on inpatient rehab is excellent 7. Anticipated functional outcomes upon discharge from inpatient rehab are modified independent and  supervision  with PT, modified independent and supervision with OT, independent and modified independent with SLP. 8. Estimated rehab length of stay to reach the above functional goals is: 15-19 days. 9. Does the patient have adequate social supports and living environment to accommodate these discharge functional goals? Yes 10. Anticipated D/C setting: Home 11. Anticipated post D/C treatments: HH therapy and Home excercise program 12. Overall Rehab/Functional Prognosis: good  RECOMMENDATIONS: This patient's condition is appropriate for continued rehabilitative care in the following setting: CIR after completion of medical workup and medically stable Patient has agreed to participate in recommended program. Yes Note that insurance prior authorization may be required for reimbursement for recommended care.  Comment: Rehab Admissions Coordinator to follow up.  Delice Lesch, MD, Mellody Drown Cathlyn Parsons., PA-C 06/20/2016

## 2016-06-20 NOTE — Progress Notes (Signed)
PT Cancellation Note  Patient Details Name: [name] MRN: [id number] DOB: [birth year]   Cancelled Treatment:    Reason Eval/Treat Not Completed: Patient not medically ready. RN just started heparin drip. Per our protocol, pt has to have been on heparin 24 hours prior to PT working with them. PT to return as able, as appropriate.   Nikeisha Klutz M Adriann Ballweg 06/20/2016, 2:48 PM  Kittie Plater, PT, DPT Pager #: [phone] Office #: [phone]

## 2016-06-20 NOTE — Progress Notes (Signed)
OT Cancellation Note  Patient Details Name: KUTLER VANVRANKEN MRN: [id number] DOB: 02-27-1946   Cancelled Treatment:    Reason Eval/Treat Not Completed: Patient not medically ready. RN beginning heparin drip and per protocol therapy to be held for 24 hours. Will return to continue with OT plan of care as able and medically appropriate.  [name] Herrlich, MS OTR/L  Pager: [phone]   [name] Herrlich 06/20/2016, 3:36 PM

## 2016-06-21 ENCOUNTER — Inpatient Hospital Stay (HOSPITAL_COMMUNITY): Payer: Medicare Other

## 2016-06-21 ENCOUNTER — Inpatient Hospital Stay (HOSPITAL_COMMUNITY)
Admission: RE | Admit: 2016-06-21 | Discharge: 2016-07-12 | DRG: 057 | Disposition: A | Discharge status: Home / Self Care | Payer: Medicare Other | Source: Intra-hospital | Attending: Physical Medicine & Rehabilitation | Admitting: Physical Medicine & Rehabilitation

## 2016-06-21 ENCOUNTER — Encounter (HOSPITAL_COMMUNITY): Payer: Self-pay | Admitting: General Practice

## 2016-06-21 ENCOUNTER — Encounter (HOSPITAL_COMMUNITY): Payer: Self-pay | Admitting: Nurse Practitioner

## 2016-06-21 DIAGNOSIS — Z823 Family history of stroke: Secondary | ICD-10-CM

## 2016-06-21 DIAGNOSIS — R339 Retention of urine, unspecified: Secondary | ICD-10-CM | POA: Diagnosis not present

## 2016-06-21 DIAGNOSIS — I6349 Cerebral infarction due to embolism of other cerebral artery: Secondary | ICD-10-CM | POA: Diagnosis not present

## 2016-06-21 DIAGNOSIS — R1310 Dysphagia, unspecified: Secondary | ICD-10-CM | POA: Diagnosis present

## 2016-06-21 DIAGNOSIS — E785 Hyperlipidemia, unspecified: Secondary | ICD-10-CM | POA: Diagnosis present

## 2016-06-21 DIAGNOSIS — Z6841 Body Mass Index (BMI) 40.0 and over, adult: Secondary | ICD-10-CM

## 2016-06-21 DIAGNOSIS — Z885 Allergy status to narcotic agent status: Secondary | ICD-10-CM

## 2016-06-21 DIAGNOSIS — I5032 Chronic diastolic (congestive) heart failure: Secondary | ICD-10-CM | POA: Diagnosis present

## 2016-06-21 DIAGNOSIS — N400 Enlarged prostate without lower urinary tract symptoms: Secondary | ICD-10-CM | POA: Diagnosis present

## 2016-06-21 DIAGNOSIS — I5030 Unspecified diastolic (congestive) heart failure: Secondary | ICD-10-CM | POA: Diagnosis present

## 2016-06-21 DIAGNOSIS — Z96653 Presence of artificial knee joint, bilateral: Secondary | ICD-10-CM | POA: Diagnosis present

## 2016-06-21 DIAGNOSIS — I69393 Ataxia following cerebral infarction: Principal | ICD-10-CM

## 2016-06-21 DIAGNOSIS — K5900 Constipation, unspecified: Secondary | ICD-10-CM | POA: Diagnosis present

## 2016-06-21 DIAGNOSIS — F419 Anxiety disorder, unspecified: Secondary | ICD-10-CM | POA: Diagnosis present

## 2016-06-21 DIAGNOSIS — G4733 Obstructive sleep apnea (adult) (pediatric): Secondary | ICD-10-CM

## 2016-06-21 DIAGNOSIS — J3801 Paralysis of vocal cords and larynx, unilateral: Secondary | ICD-10-CM | POA: Diagnosis present

## 2016-06-21 DIAGNOSIS — Z9989 Dependence on other enabling machines and devices: Secondary | ICD-10-CM | POA: Diagnosis not present

## 2016-06-21 DIAGNOSIS — I63112 Cerebral infarction due to embolism of left vertebral artery: Secondary | ICD-10-CM | POA: Diagnosis not present

## 2016-06-21 DIAGNOSIS — G463 Brain stem stroke syndrome: Secondary | ICD-10-CM | POA: Diagnosis not present

## 2016-06-21 DIAGNOSIS — I10 Essential (primary) hypertension: Secondary | ICD-10-CM

## 2016-06-21 DIAGNOSIS — Z79899 Other long term (current) drug therapy: Secondary | ICD-10-CM | POA: Diagnosis not present

## 2016-06-21 DIAGNOSIS — F330 Major depressive disorder, recurrent, mild: Secondary | ICD-10-CM

## 2016-06-21 DIAGNOSIS — Z880 Allergy status to penicillin: Secondary | ICD-10-CM

## 2016-06-21 DIAGNOSIS — D72829 Elevated white blood cell count, unspecified: Secondary | ICD-10-CM | POA: Diagnosis not present

## 2016-06-21 DIAGNOSIS — I110 Hypertensive heart disease with heart failure: Secondary | ICD-10-CM | POA: Diagnosis present

## 2016-06-21 DIAGNOSIS — I480 Paroxysmal atrial fibrillation: Secondary | ICD-10-CM | POA: Diagnosis present

## 2016-06-21 DIAGNOSIS — Z86711 Personal history of pulmonary embolism: Secondary | ICD-10-CM

## 2016-06-21 DIAGNOSIS — I4891 Unspecified atrial fibrillation: Secondary | ICD-10-CM | POA: Diagnosis present

## 2016-06-21 DIAGNOSIS — I6340 Cerebral infarction due to embolism of unspecified cerebral artery: Secondary | ICD-10-CM | POA: Diagnosis present

## 2016-06-21 DIAGNOSIS — R1312 Dysphagia, oropharyngeal phase: Secondary | ICD-10-CM | POA: Diagnosis not present

## 2016-06-21 DIAGNOSIS — R208 Other disturbances of skin sensation: Secondary | ICD-10-CM | POA: Diagnosis not present

## 2016-06-21 DIAGNOSIS — E876 Hypokalemia: Secondary | ICD-10-CM | POA: Diagnosis not present

## 2016-06-21 DIAGNOSIS — K5901 Slow transit constipation: Secondary | ICD-10-CM | POA: Diagnosis not present

## 2016-06-21 DIAGNOSIS — H532 Diplopia: Secondary | ICD-10-CM | POA: Diagnosis present

## 2016-06-21 DIAGNOSIS — Z87891 Personal history of nicotine dependence: Secondary | ICD-10-CM | POA: Diagnosis not present

## 2016-06-21 DIAGNOSIS — Z7982 Long term (current) use of aspirin: Secondary | ICD-10-CM | POA: Diagnosis not present

## 2016-06-21 DIAGNOSIS — Z7951 Long term (current) use of inhaled steroids: Secondary | ICD-10-CM

## 2016-06-21 DIAGNOSIS — IMO0002 Reserved for concepts with insufficient information to code with codable children: Secondary | ICD-10-CM

## 2016-06-21 HISTORY — DX: Cerebral infarction due to embolism of unspecified cerebral artery: I63.40

## 2016-06-21 LAB — HEPARIN LEVEL (UNFRACTIONATED): Heparin Unfractionated: 0.96 IU/mL — ABNORMAL HIGH (ref 0.30–0.70)

## 2016-06-21 LAB — BASIC METABOLIC PANEL
Anion gap: 10 (ref 5–15)
BUN: 16 mg/dL (ref 6–20)
CHLORIDE: 101 mmol/L (ref 101–111)
CO2: 27 mmol/L (ref 22–32)
Calcium: 8.4 mg/dL — ABNORMAL LOW (ref 8.9–10.3)
Creatinine, Ser: 1.05 mg/dL (ref 0.61–1.24)
GFR calc non Af Amer: >60 mL/min (ref >60)
Glucose, Bld: 92 mg/dL (ref 65–99)
POTASSIUM: 3.7 mmol/L (ref 3.5–5.1)
SODIUM: 138 mmol/L (ref 135–145)

## 2016-06-21 LAB — CBC
HEMATOCRIT: 45.1 % (ref 39.0–52.0)
HEMOGLOBIN: 14.4 g/dL (ref 13.0–17.0)
MCH: 28.3 pg (ref 26.0–34.0)
MCHC: 31.9 g/dL (ref 30.0–36.0)
MCV: 88.8 fL (ref 78.0–100.0)
Platelets: 160 10*3/uL (ref 150–400)
RBC: 5.08 MIL/uL (ref 4.22–5.81)
RDW: 14 % (ref 11.5–15.5)
WBC: 9.3 10*3/uL (ref 4.0–10.5)

## 2016-06-21 LAB — MAGNESIUM: MAGNESIUM: 2.2 mg/dL (ref 1.7–2.4)

## 2016-06-21 LAB — APTT: aPTT: 64 seconds — ABNORMAL HIGH (ref 24–36)

## 2016-06-21 MED ORDER — FLECAINIDE ACETATE 50 MG PO TABS
50.0000 mg | ORAL_TABLET | Freq: Two times a day (BID) | ORAL | Status: DC
Start: 2016-06-22 — End: 2016-07-12
  Administered 2016-06-22 – 2016-07-12 (×41): 50 mg via ORAL
  Filled 2016-06-21 (×44): qty 1

## 2016-06-21 MED ORDER — FINASTERIDE 5 MG PO TABS
5.0000 mg | ORAL_TABLET | Freq: Every day | ORAL | Status: DC
  Administered 2016-06-22 – 2016-07-12 (×21): 5 mg via ORAL
  Filled 2016-06-21 (×21): qty 1

## 2016-06-21 MED ORDER — SERTRALINE HCL 50 MG PO TABS
150.0000 mg | ORAL_TABLET | Freq: Every day | ORAL | Status: DC
  Administered 2016-06-22 – 2016-07-12 (×21): 150 mg via ORAL
  Filled 2016-06-21 (×21): qty 3

## 2016-06-21 MED ORDER — APIXABAN 5 MG PO TABS
5.0000 mg | ORAL_TABLET | Freq: Two times a day (BID) | ORAL | Status: DC
  Administered 2016-06-21 – 2016-07-12 (×42): 5 mg via ORAL
  Filled 2016-06-21 (×42): qty 1

## 2016-06-21 MED ORDER — ONDANSETRON HCL 4 MG PO TABS: 4.0000 mg | ORAL_TABLET | Freq: Four times a day (QID) | ORAL | Status: DC | PRN

## 2016-06-21 MED ORDER — ONDANSETRON HCL 4 MG/2ML IJ SOLN: 4.0000 mg | Freq: Four times a day (QID) | INTRAMUSCULAR | Status: DC | PRN

## 2016-06-21 MED ORDER — ASPIRIN 81 MG PO CHEW
81.0000 mg | CHEWABLE_TABLET | Freq: Two times a day (BID) | ORAL | Status: DC
  Administered 2016-06-21 – 2016-07-12 (×42): 81 mg via ORAL
  Filled 2016-06-21 (×43): qty 1

## 2016-06-21 MED ORDER — ATORVASTATIN CALCIUM 80 MG PO TABS: 80.0000 mg | ORAL_TABLET | Freq: Every day | ORAL | Status: DC

## 2016-06-21 MED ORDER — FUROSEMIDE 40 MG PO TABS
40.0000 mg | ORAL_TABLET | Freq: Every day | ORAL | Status: DC
  Administered 2016-06-21: 40 mg via ORAL
  Filled 2016-06-21: qty 1

## 2016-06-21 MED ORDER — PANTOPRAZOLE SODIUM 40 MG PO TBEC
40.0000 mg | DELAYED_RELEASE_TABLET | Freq: Every day | ORAL | Status: DC
  Administered 2016-06-22 – 2016-07-12 (×21): 40 mg via ORAL
  Filled 2016-06-21 (×21): qty 1

## 2016-06-21 MED ORDER — APIXABAN 5 MG PO TABS
5.0000 mg | ORAL_TABLET | Freq: Two times a day (BID) | ORAL | Status: DC
  Administered 2016-06-21: 5 mg via ORAL
  Filled 2016-06-21: qty 1

## 2016-06-21 MED ORDER — PANTOPRAZOLE SODIUM 40 MG PO TBEC
40.0000 mg | DELAYED_RELEASE_TABLET | Freq: Every day | ORAL | Status: DC
  Administered 2016-06-21: 40 mg via ORAL
  Filled 2016-06-21: qty 1

## 2016-06-21 MED ORDER — ATORVASTATIN CALCIUM 80 MG PO TABS
80.0000 mg | ORAL_TABLET | Freq: Every day | ORAL | Status: DC
  Administered 2016-06-21 – 2016-07-11 (×21): 80 mg via ORAL
  Filled 2016-06-21 (×21): qty 1

## 2016-06-21 MED ORDER — RISPERIDONE 1 MG PO TABS
0.5000 mg | ORAL_TABLET | Freq: Every day | ORAL | Status: DC
  Administered 2016-06-21 – 2016-07-11 (×21): 0.5 mg via ORAL
  Filled 2016-06-21 (×21): qty 1

## 2016-06-21 MED ORDER — SORBITOL 70 % SOLN: 30.0000 mL | Freq: Every day | Status: DC | PRN

## 2016-06-21 MED ORDER — AMLODIPINE BESY-BENAZEPRIL HCL 5-10 MG PO CAPS: 1.0000 | ORAL_CAPSULE | Freq: Every day | ORAL | 5 refills | Status: DC

## 2016-06-21 MED ORDER — ASPIRIN 81 MG PO CHEW
81.0000 mg | CHEWABLE_TABLET | Freq: Two times a day (BID) | ORAL | Status: DC
  Administered 2016-06-21: 81 mg via ORAL
  Filled 2016-06-21: qty 1

## 2016-06-21 MED ORDER — MOMETASONE FURO-FORMOTEROL FUM 200-5 MCG/ACT IN AERO
2.0000 | INHALATION_SPRAY | Freq: Two times a day (BID) | RESPIRATORY_TRACT | Status: DC
  Administered 2016-06-22 – 2016-07-12 (×36): 2 via RESPIRATORY_TRACT
  Filled 2016-06-21 (×2): qty 8.8

## 2016-06-21 MED ORDER — FUROSEMIDE 40 MG PO TABS
40.0000 mg | ORAL_TABLET | Freq: Every day | ORAL | Status: DC
  Administered 2016-06-22 – 2016-07-12 (×21): 40 mg via ORAL
  Filled 2016-06-21 (×21): qty 1

## 2016-06-21 NOTE — Progress Notes (Signed)
ANTICOAGULATION CONSULT NOTE - Follow-up Consult  Pharmacy Consult for heparin Indication: atrial fibrillation  Allergies  Allergen Reactions  . Morphine Other (See Comments)    Nausea and Severe hallucinations  . Codeine Nausea Only  . Penicillins Other (See Comments)    LOC with shot  . Propoxyphene N-Acetaminophen Nausea Only    Patient Measurements: Weight: (S) (!) 358 lb 0.4 oz (162.4 kg) Heparin Dosing Weight: 116.5 KG  Vital Signs: Temp: 97.6 F (36.4 C) (03/21 0500) Temp Source: Oral (03/21 0500) BP: 127/73 (03/21 0117) Pulse Rate: 55 (03/21 0500)  Labs:  Recent Labs  06/19/16 0223 06/19/16 0233 06/20/16 1929 06/21/16 0614  HGB 14.6 14.6  --  14.4  HCT 44.4 43.0  --  45.1  PLT 183  --   --  160  APTT 25  --  61* 64*  LABPROT 14.2  --   --   --   INR 1.09  --   --   --   HEPARINUNFRC  --   --  1.10*  --   CREATININE 1.07 1.00  --  1.05    Estimated Creatinine Clearance: 103.2 mL/min (by C-G formula based on SCr of 1.05 mg/dL).  Assessment: 71 y.o.malewith a complex cardiac medical history significant for Afib and PE on Eliquis. History of GI bleed/ stomach ulcers while on Xarelto in the past. Neurologic workup showed possible left medullary small acute infarct along with possible left vertebral artery occlusion. Patient is NPO and pharmacy has been asked to start heparin infusion. Last dose of Eliquis 06/19/16 @ 0732. Will follow PTTs until heparin levels correlate as apixaban affects heparin levels. PTT 64 sec (slightly subtherapeutic) on gtt at 1500 units/hr. No issues with line or bleeding reported per RN. CBC stable.  Goal of Therapy:  Heparin level 0.3-0.5 units/ml aPTT 66-85 seconds Monitor platelets by anticoagulation protocol: Yes   Plan:  Increase heparin infusion to 1850 units/hr  Will f/u 8hr PTT  Sherlon Handing, PharmD, BCPS Clinical pharmacist, pager [phone] 06/21/2016 7:14 AM

## 2016-06-21 NOTE — Progress Notes (Signed)
STROKE TEAM PROGRESS NOTE   SUBJECTIVE (INTERVAL HISTORY) His son is at the bedside. Has been on xarelto in the pat and per son "it almost killed him". Dr. Leonie Man discussed other options with them. Pt up in chair. Able to take pills this am.    OBJECTIVE Temp:  [97.6 F (36.4 C)-98.6 F (37 C)] 98.3 F (36.8 C) (03/21 0917) Pulse Rate:  [51-64] 64 (03/21 0917) Cardiac Rhythm: Sinus bradycardia (03/21 0700) Resp:  [20] 20 (03/21 0917) BP: (127-144)/(66-75) 144/75 (03/21 0917) SpO2:  [97 %-100 %] 98 % (03/21 0917)  CBC:   Recent Labs Lab 06/19/16 0223 06/19/16 0233 06/21/16 0614  WBC 10.0  --  9.3  NEUTROABS 6.1  --   --   HGB 14.6 14.6 14.4  HCT 44.4 43.0 45.1  MCV 87.7  --  88.8  PLT 183  --  902    Basic Metabolic Panel:   Recent Labs Lab 06/19/16 0223 06/19/16 0233 06/21/16 0614  NA 139 141 138  K 3.8 3.2* 3.7  CL 104 104 101  CO2 24  --  27  GLUCOSE 113* 115* 92  BUN 14 15 16   CREATININE 1.07 1.00 1.05  CALCIUM 8.2*  --  8.4*  MG  --   --  2.2    PHYSICAL EXAM Pleasant elderly Caucasian male currently not in distress . Afebrile. Head is nontraumatic. Neck is supple without bruit.    Cardiac exam no murmur or gallop. Lungs are clear to auscultation. Distal pulses are well felt.. Neurological Exam :  Awake alert oriented x3. No dysarthria. No aphasia.fundi not visualized. Patient has photophobia due to severe vertigo and can barely keep his eyes open. Pupils equal reactive. Face is symmetric without weakness. Tongue is midline. Motor system exam shows no extremity drift. No focal weakness. Finger-to-nose coordination slightly impaired on the right and normal on the left. knee to heel coordination is normal.sensation is preserved bilaterally. Deep tendon reflexes are symmetric  Plantars are downgoing. Gait not tested.   ASSESSMENT/PLAN [name] is a 71 y.o. male with history of AFIB and PE on Eliquis, CHF, HTN, pericardial effusion s/p window, OSA  on CAPA presenting with ataxia and HA. He did not receive IV t-PA due to resolved symptoms, on eliquis.   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   Resultant  Diplopia, dizziness  Code Stroke CT no acute abnormality. Aspects 10  CTA head ane neck L VA occlusion vs dissection (poor quality)  MRI  L medullary infarct. L V4 abnormality. L pontine remote microhemorrhage  2D Echo  EF 60-65%, no SOE  LDL 77  HgbA1c  5.8  eliquis for VTE prophylaxis DIET DYS 3 Room service appropriate? Yes; Fluid consistency: Thin  aspirin 81 mg daily and Eliquis (apixaban) daily prior to admission (he did not take last night), changed to IV heparin as unable to swallow yesterday, IV heparin currently running, has orders to change back to  aspirin 81 mg daily and Eliquis (apixaban) daily    Therapy recommendations:  Pending.  Disposition:  pending   Atrial Fibrillation  Home anticoagulation:  aspirin 81 mg daily and Eliquis (apixaban) daily   Home on aspirin and eliquis    Hypertension  Stable Long-term BP goal normotensive  Hyperlipidemia  Home meds:  lipitor 40, resumed in hospital  LDL 77, just above goal  Continue statin at discharge  Diabetes  HgbA1c at goal <  7.0  Other Stroke Risk Factors  Advanced age  Former Cigarette smoker  ETOH use  UDS not performed  Morbid Obesity, Body mass index is 48.56 kg/m.  Obstructive sleep apnea, on CPAP at home  Chronic diastolic CHF  NOTHING FURTHER TO ADD FROM THE STROKE STANDPOINT  Patient has a 10-15% risk of having another stroke over the next year, the highest risk is within 2 weeks of the most recent stroke/TIA (risk of having a stroke following a stroke or TIA is the same).  Ongoing risk factor control by Primary Care Physician  Stroke Service will sign off. Please call should any needs arise.  Follow-up Stroke Clinic at [facility] with Dr. Antony Contras in 2 months, order placed.   Hospital day # [place] for Pager information 06/21/2016 10:14 AM  I have personally examined this patient, reviewed notes, independently viewed imaging studies, participated in medical decision making and plan of care.ROS completed by me personally and pertinent positives fully documented  I have made any additions or clarifications directly to the above note. Agree with note above.    Antony Contras, MD Medical Director [facility] Pager: [phone] 06/21/2016 4:22 PM   To contact Stroke Continuity provider, please refer to [email or web address] After hours, contact General Neurology

## 2016-06-21 NOTE — Progress Notes (Signed)
Modified Barium Swallow Progress Note  Patient Details  Name: LAQUINTON BIHM MRN: [id number] Date of Birth: 08-Dec-1945  Today's Date: 06/21/2016  Modified Barium Swallow completed.  Full report located under Chart Review in the Imaging Section.  Brief recommendations include the following:  Clinical Impression  Pt presents with mild oropharyngeal dysphagia resulting in decreased swallow coordination.  Explosive coughing noted with large amount of aspiration of thin *pt cued to "gulp", aspirates were deep and did not clear.  Small single boluses of thin result in trace laryngeal penetration of thin clearing with intermittent cued throat clear.  Chin tuck posture worsened swalllow/penetration.  Pharyngeal swallow c/b decreased timing of laryngeal closure and decreased tongue base retraction/epiglottic deflection with mild vallecular residuals of thicker consistencies.  Fortunately pt sensed residuals and conducted dry swallow to clear.  Of note, pt frequently cleared his throat during evaluation/MBS and reports occured prior to admission - he attributes this to nasal drainage/allergies.  Using live video, educated pt to findings/recommendations.  SLP will follow up for skilled SLP treatment to maximize swallow/phonation function.       Swallow Evaluation Recommendations       SLP Diet Recommendations: Dysphagia 3 (Mech soft) solids;Thin liquid   Liquid Administration via: Cup;No straw   Medication Administration: Whole meds with puree   Supervision: Patient able to self feed   Compensations: Slow rate;Small sips/bites;Multiple dry swallows after each bite/sip;Clear throat intermittently   Postural Changes: Remain semi-upright after after feeds/meals (Comment);Seated upright at 90 degrees   Oral Care Recommendations: Oral care BID        Luanna Salk, [facility] SLP [phone]

## 2016-06-21 NOTE — Progress Notes (Signed)
Patient ID: [name], male   DOB: 18-Apr-1945, 71 y.o.   MRN: [id number] Patient admitted to [phone] via recliner chair escorted by nursing staff.  Patient transferred to bed with 3+ assist for poor balance, almost falling.  Patient verbalized understanding of rehab process.  Appears to be in no immediate distress at this time.  Brita Romp, RN

## 2016-06-21 NOTE — Progress Notes (Signed)
Inpatient Rehabilitation  Received medical clearance to admit patient to IP Rehab today and have a bed available.  Will proceed with admission.  Please call with questions.   Carmelia Roller., CCC/SLP Admission Coordinator  [place]  Cell [phone]

## 2016-06-21 NOTE — Discharge Instructions (Signed)
Follow with Primary MD [name], MD in 7 days   Get CBC, CMP, 2 view Chest X ray checked  by Primary MD or SNF MD in 5-7 days ( we routinely change or add medications that can affect your baseline labs and fluid status, therefore we recommend that you get the mentioned basic workup next visit with your PCP, your PCP may decide not to get them or add new tests based on their clinical decision)  Activity: As tolerated with Full fall precautions use walker/cane & assistance as needed  Disposition CIR  Diet:   DIET DYS 3 with feeding assistance and aspiration precautions.  For Heart failure patients - Check your Weight same time everyday, if you gain over 2 pounds, or you develop in leg swelling, experience more shortness of breath or chest pain, call your Primary MD immediately. Follow Cardiac Low Salt Diet and 1.5 lit/day fluid restriction.  On your next visit with your primary care physician please Get Medicines reviewed and adjusted.  Please request your Prim.MD to go over all Hospital Tests and Procedure/Radiological results at the follow up, please get all Hospital records sent to your Prim MD by signing hospital release before you go home.  If you experience worsening of your admission symptoms, develop shortness of breath, life threatening emergency, suicidal or homicidal thoughts you must seek medical attention immediately by calling 911 or calling your MD immediately  if symptoms less severe.  You Must read complete instructions/literature along with all the possible adverse reactions/side effects for all the Medicines you take and that have been prescribed to you. Take any new Medicines after you have completely understood and accpet all the possible adverse reactions/side effects.   Do not drive, operate heavy machinery, perform activities at heights, swimming or participation in water activities or provide baby sitting services if your were admitted for syncope or siezures until you  have seen by Primary MD or a Neurologist and advised to do so again.  Do not drive when taking Pain medications.    Do not take more than prescribed Pain, Sleep and Anxiety Medications  Special Instructions: If you have smoked or chewed Tobacco  in the last 2 yrs please stop smoking, stop any regular Alcohol  and or any Recreational drug use.  Wear Seat belts while driving.   Please note  You were cared for by a hospitalist during your hospital stay. If you have any questions about your discharge medications or the care you received while you were in the hospital after you are discharged, you can call the unit and asked to speak with the hospitalist on call if the hospitalist that took care of you is not available. Once you are discharged, your primary care physician will handle any further medical issues. Please note that NO REFILLS for any discharge medications will be authorized once you are discharged, as it is imperative that you return to your primary care physician (or establish a relationship with a primary care physician if you do not have one) for your aftercare needs so that they can reassess your need for medications and monitor your lab values.

## 2016-06-21 NOTE — H&P (Signed)
Physical Medicine and Rehabilitation Admission H&P    Chief Complaint  Patient presents with  . Code Stroke    LSN [id number]  : HPI: [name] is a 71 y.o. right handed male with history of atrial fibrillation And pulmonary emboli maintained on Eliquis as well as low-dose aspirin, hypertension, history of pericardial effusion s/p window, OSA on CPAP, diastolic congestive heart failure, bilateral TKA, morbid obesity with gastric banding procedure. Per chart review, and patient, patient lives with spouse. Independent prior to admission using a cane and walker at times due to history of knee replacement. One level home with 7 steps to entry. Wife with limited physical assist with history of CABG. Son in the area works. Presented 06/19/2016 with headache of sudden onset while watching television and lean to the left side as well as slurred speech. Cranial CT scan negative. Patient did not receive TPA. 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 No proximal occlusion of the intracranial arteries. No significant stenosis or dissection of the carotid arteries. 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 Interventional radiology consulted no need for emergent endovascular treatment at present time. Echocardiogram with ejection fraction 32% grade 1 diastolic dysfunction.Swallow study completed 06/21/2016 placed on a mechanical soft thin liquid diet. Neurology services consult infarct felt to be embolic secondary to known atrial fibrillation and remains on Eliquis as well as aspirin.  Physical and occupational therapy evaluations completed with recommendations of physical medicine rehabilitation consult. Patient was admitted for a comprehensive rehabilitation program  Review of Systems  Constitutional: Negative for chills and fever.  HENT: Negative for hearing loss.   Eyes: Positive for double vision. Negative for  blurred vision.  Respiratory: Positive for shortness of breath.   Cardiovascular: Positive for leg swelling. Negative for chest pain and palpitations.  Gastrointestinal: Positive for constipation and nausea.  Genitourinary: Negative for dysuria and hematuria.  Musculoskeletal: Positive for joint pain and myalgias.  Skin: Negative for rash.  Neurological: Positive for dizziness, sensory change, speech change and headaches.  Psychiatric/Behavioral: Positive for depression.  All other systems reviewed and are negative.  Past Medical History:  Diagnosis Date  . Anemia 07/29/2013  . Asthma    childhood asthma  . Benign prostatic hypertrophy    several prostate biopsies neg for cancer.   . Bilateral hip pain   . Cancer ([place])   . CHF (congestive heart failure) ([place])   . Complication of anesthesia    MORPHINE CAUSES HALLUCINATIONS  . Depression   . FH: colonic polyps 2010  . Gout   . History of kidney stones   . Hypertension   . Kidney cysts   . Morbid obesity ([place])   . Numbness    feet / legs  . PAF (paroxysmal atrial fibrillation) with RVR 07/29/2013  . Sleep apnea    USES C PAP   Past Surgical History:  Procedure Laterality Date  . ARTHROSCOPY KNEE W/ DRILLING    . BREATH TEK H PYLORI N/A 12/23/2012   Procedure: BREATH TEK H PYLORI;  Surgeon: Gayland Curry, MD;  Location: [facility];  Service: General;  Laterality: N/A;  . CHOLECYSTECTOMY  1989  . COLONOSCOPY N/A 07/31/2013   Procedure: COLONOSCOPY;  Surgeon: Gatha Mayer, MD;  Location: [place];  Service: Endoscopy;  Laterality: N/A;  . ESOPHAGOGASTRODUODENOSCOPY N/A 07/22/2013   Procedure: ESOPHAGOGASTRODUODENOSCOPY (EGD);  Surgeon: Irene Shipper, MD;  Location:  [facility];  Service: Endoscopy;  Laterality: N/A;  . ESOPHAGOGASTRODUODENOSCOPY N/A 07/31/2013   Procedure: ESOPHAGOGASTRODUODENOSCOPY (EGD);  Surgeon: Gatha Mayer, MD;  Location: [facility];  Service: Endoscopy;  Laterality: N/A;  . INTRAOPERATIVE  TRANSESOPHAGEAL ECHOCARDIOGRAM N/A 07/18/2013   Procedure: INTRAOPERATIVE TRANSESOPHAGEAL ECHOCARDIOGRAM;  Surgeon: Grace Isaac, MD;  Location: [facility] [facility];  Service: Open Heart Surgery;  Laterality: N/A;  . LAPAROSCOPIC GASTRIC BANDING WITH HIATAL HERNIA REPAIR N/A 04/08/2013   Procedure: LAPAROSCOPIC GASTRIC BANDING WITH possible  HIATAL HERNIA REPAIR;  Surgeon: Gayland Curry, MD;  Location: [facility];  Service: General;  Laterality: N/A;  . LITHOTRIPSY    . renal calculi    . SUBXYPHOID PERICARDIAL WINDOW N/A 07/18/2013   Procedure: SUBXYPHOID PERICARDIAL WINDOW;  Surgeon: Grace Isaac, MD;  Location: [place];  Service: Thoracic;  Laterality: N/A;  . TONSILLECTOMY    . total knee replacment     bilat  . trigger finger repair     also lue, cts surgically repaired  . WISDOM TOOTH EXTRACTION     Family History  Problem Relation Age of Onset  . Depression Mother   . Hypertension Mother   . Heart disease Father     MI  . Depression Brother   . Stroke Paternal Aunt     CVA  . Diabetes Paternal Uncle   . Heart disease Paternal Uncle     MI  . Heart disease Paternal Grandmother     MI  . Diabetes Cousin     PATERNAL   Social History:  reports that he quit smoking about 41 years ago. His smoking use included Cigarettes. He started smoking about 55 years ago. He has a 2.60 pack-year smoking history. He has never used smokeless tobacco. He reports that he drinks alcohol. He reports that he does not use drugs. Allergies:  Allergies  Allergen Reactions  . Morphine Other (See Comments)    Nausea and Severe hallucinations  . Codeine Nausea Only  . Penicillins Other (See Comments)    LOC with shot  . Propoxyphene N-Acetaminophen Nausea Only   Medications Prior to Admission  Medication Sig Dispense Refill  . amLODipine-benazepril (LOTREL) 5-10 MG capsule Take 1 capsule by mouth daily. 30 capsule 5  . apixaban (ELIQUIS) 5 MG TABS tablet Take 1 tablet (5 mg total) by mouth 2 (two) times  daily. 180 tablet 3  . aspirin 81 MG tablet Take 81 mg by mouth 2 (two) times daily.     [name] atorvastatin (LIPITOR) 40 MG tablet Take 1 tablet (40 mg total) by mouth daily. 90 tablet 0  . budesonide-formoterol (SYMBICORT) 160-4.5 MCG/ACT inhaler Inhale 2 puffs into the lungs 2 (two) times daily as needed (shortness of breath).    . CALCIUM PO Take 500 mg by mouth 3 (three) times daily.     . Cyanocobalamin (VITAMIN B-12) 1000 MCG SUBL Place 1,000 mcg under the tongue every evening.     [name] Sodium (DSS) 100 MG CAPS Take 100 mg by mouth every other day.    . finasteride (PROSCAR) 5 MG tablet Take 5 mg by mouth daily.     . flecainide (TAMBOCOR) 50 MG tablet TAKE ONE TABLET BY MOUTH EVERY 12 HOURS 180 tablet 2  . furosemide (LASIX) 20 MG tablet TAKE 1 TAB ONCE A DAY AS NEEDED FOR SWELLING IN THE LEGS (Patient taking differently: TAKE 1 TAB ONCE A DAY FOR SWELLING IN THE LEGS) 90 tablet 1  . loratadine (CLARITIN) 10 MG  tablet Take 10 mg by mouth daily.    [name] LORazepam (ATIVAN) 0.5 MG tablet TAKE ONE TABLET BY MOUTH ONCE DAILY IN THE MORNING AND TWO AT BEDTIME 90 tablet 2  . montelukast (SINGULAIR) 10 MG tablet TAKE 1 TABLET BY MOUTH ONCE A DAY AT BEDTIME 30 tablet 1  . naphazoline-pheniramine (NAPHCON-A) 0.025-0.3 % ophthalmic solution Place 1 drop into both eyes 4 (four) times daily as needed for irritation.    . pantoprazole (PROTONIX) 40 MG tablet Take 1 tablet (40 mg total) by mouth 2 (two) times daily. 180 tablet 0  . risperiDONE (RISPERDAL) 0.5 MG tablet Take 1 tablet (0.5 mg total) by mouth at bedtime. 30 tablet 2  . rOPINIRole (REQUIP) 0.25 MG tablet TAKE THREE TABLETS BY MOUTH AT BEDTIME OR  AS  DIRECTED  FOR  LEG  JERKS 90 tablet 11  . sertraline (ZOLOFT) 100 MG tablet TAKE ONE AND ONE-HALF TABLETS BY MOUTH ONCE DAILY WITH  BREAKFAST 45 tablet 2    Home: Home Living Family/patient expects to be discharged to:: Private residence Living Arrangements: Spouse/significant other Available  Help at Discharge: Family Type of Home: House Home Access: Stairs to enter Technical brewer of Steps: 7 Entrance Stairs-Rails: Left, Right Home Layout: One level Bathroom Shower/Tub: Chiropodist: Standard Home Equipment: Environmental consultant - 2 wheels, [employer] - quad   Functional History: Prior Function Level of Independence: Independent  Functional Status:  Mobility: Bed Mobility Overal bed mobility: Needs Assistance Bed Mobility: Supine to Sit, Sit to Supine Supine to sit: Min assist Sit to supine: Mod assist General bed mobility comments: pt requires incr time and effort to exit bed on the R side. pt needed help with power up from supine. pt requires (A) for BIL LE and to sequence return to supine Transfers Overall transfer level: Needs assistance Equipment used: 2 person hand held assist Transfers: Sit to/from Stand Sit to Stand: +2 physical assistance, Mod assist General transfer comment: pt requires (A) once in standing due to immediate lob to the L side. pt requires total +2 mod to max in static standing. pt with chair positioned for BIL UE to help with center while in static standing Ambulation/Gait Ambulation/Gait assistance: Mod assist, +2 physical assistance Ambulation Distance (Feet):  (3 side steps to [facility]) Assistive device: 2 person hand held assist General Gait Details: limited to side stepping to HOB, pt with strong lean to the L, dificulty with raising L LE and impaired proprioception Gait velocity: slow Gait velocity interpretation: Below normal speed for age/gender    ADL: ADL Overall ADL's : Needs assistance/impaired Eating/Feeding Details (indicate cue type and reason): pt reports change in voice quality and question need for SLP Grooming: Wash/dry hands, Min guard Upper Body Bathing: Minimal assistance Lower Body Bathing: Maximal assistance Lower Body Bathing Details (indicate cue type and reason): pt requires (A) for balance due to L  lean General ADL Comments: pt static sitting with strong L lean. pt requires (A) for upright posture. Pt states I can't hold it there  Cognition: Cognition Overall Cognitive Status: Within Functional Limits for tasks assessed Orientation Level: Oriented X4 Cognition Arousal/Alertness: Awake/alert Behavior During Therapy: WFL for tasks assessed/performed Overall Cognitive Status: Within Functional Limits for tasks assessed  Physical Exam: Blood pressure 127/73, pulse (!) 55, temperature 97.6 F (36.4 C), temperature source Oral, resp. rate 20, weight (S) (!) 162.4 kg (358 lb 0.4 oz), SpO2 97 %. Physical Exam  Vitals reviewed. Constitutional: He is oriented to person, place, and time.  He appears well-developed.  71 year old right-handed obese male  HENT:  Head: Normocephalic and atraumatic.  Eyes: Conjunctivae and EOM are normal. Left eye exhibits no discharge.  Neck: Normal range of motion. Neck supple. No thyromegaly present.  Cardiovascular: Normal rate and regular rhythm.   Respiratory: Effort normal.  Patient does have some upper airway congestion but clear to auscultation at the lower lobes  GI: Soft. Bowel sounds are normal. He exhibits no distension.  Musculoskeletal: He exhibits no edema or tenderness.  Neurological: He is alert and oriented to person, place, and time.  Speech is a bit dysarthric but fully intelligible.  Awareness of deficits.  Follows full commands Sensation intact to light touch DTRs symmetric Motor: RUE/RLE 5/5 LUE/LLE: 4+/5 with mild ataxia   Skin: Skin is warm and dry.  Psychiatric: He has a normal mood and affect. His behavior is normal.    Results for orders placed or performed during the hospital encounter of 06/19/16 (from the past 48 hour(s))  Lipid panel     Status: Abnormal   Collection Time: 06/19/16  7:30 AM  Result Value Ref Range   Cholesterol 124 0 - 200 mg/dL   Triglycerides 74 <150 mg/dL   HDL 32 (L) >40 mg/dL   Total CHOL/HDL  Ratio 3.9 RATIO   VLDL 15 0 - 40 mg/dL   LDL Cholesterol 77 0 - 99 mg/dL    Comment:        Total Cholesterol/HDL:CHD Risk Coronary Heart Disease Risk Table                     Men   Women  1/2 Average Risk   3.4   3.3  Average Risk       5.0   4.4  2 X Average Risk   9.6   7.1  3 X Average Risk  23.4   11.0        Use the calculated Patient Ratio above and the CHD Risk Table to determine the patient's CHD Risk.        ATP III CLASSIFICATION (LDL):  <100     mg/dL   Optimal  100-129  mg/dL   Near or Above                    Optimal  130-159  mg/dL   Borderline  160-189  mg/dL   High  >190     mg/dL   Very High   Hemoglobin A1c     Status: Abnormal   Collection Time: 06/19/16  7:32 AM  Result Value Ref Range   Hgb A1c MFr Bld 5.8 (H) 4.8 - 5.6 %    Comment: (NOTE)         Pre-diabetes: 5.7 - 6.4         Diabetes: >6.4         Glycemic control for adults with diabetes: <7.0    Mean Plasma Glucose 120 mg/dL    Comment: (NOTE) Performed At: [facility] [place], [place] [id number] Lindon Romp MD VO:[id number]   Heparin level (unfractionated)     Status: Abnormal   Collection Time: 06/20/16  7:29 PM  Result Value Ref Range   Heparin Unfractionated 1.10 (H) 0.30 - 0.70 IU/mL    Comment: RESULTS CONFIRMED BY MANUAL DILUTION        IF HEPARIN RESULTS ARE BELOW EXPECTED VALUES, AND PATIENT DOSAGE HAS BEEN CONFIRMED, SUGGEST FOLLOW UP TESTING OF ANTITHROMBIN III  LEVELS.   APTT     Status: Abnormal   Collection Time: 06/20/16  7:29 PM  Result Value Ref Range   aPTT 61 (H) 24 - 36 seconds    Comment:        IF BASELINE aPTT IS ELEVATED, SUGGEST PATIENT RISK ASSESSMENT BE USED TO DETERMINE APPROPRIATE ANTICOAGULANT THERAPY.    Dg Chest 2 View  Result Date: 06/19/2016 CLINICAL DATA:  SOB,WEAKNESS ON LEFT,HX HTN,CHF,ASTHMA EXAM: CHEST  2 VIEW COMPARISON:  06/13/2015; chest CT - 06/13/2015 FINDINGS: Grossly unchanged enlarged cardiac  silhouette and mediastinal contours. Grossly unchanged minimal bibasilar linear heterogeneous opacities, left greater than right, favored to represent atelectasis or scar. Resolution of previously noted heterogeneous opacity with the peripheral aspect the right mid lung. No focal airspace opacities. No definite pleural effusion, though note, the costophrenic angles are excluded on the provided lateral radiograph. No pneumothorax. No evidence of edema. No acute osseus abnormalities. IMPRESSION: Bibasilar atelectasis/scar without superimposed acute cardiopulmonary disease. Electronically Signed   By: Sandi Mariscal M.D.   On: 06/19/2016 09:07       Medical Problem List and Plan: 1.  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  DVT Prophylaxis/Anticoagulation: Eliquis 3. Pain Management: Tylenol as needed 4. Mood: Risperdal 0.5 mg daily at bedtime, Zoloft 150 mg daily 5. Neuropsych: This patient is capable of making decisions on his own behalf. 6. Skin/Wound Care: Routine skin checks 7. Fluids/Electrolytes/Nutrition: Routine I&O with follow-up chemistries 8. Dysphagia. Mechanical soft thin liquid diet. Follow-up speech therapy. Monitor for any signs of aspiration 9. Atrial fibrillation with history of pulmonary emboli. Tambocor 50 mg every 12 hours. Cardiac rate controlled. 10. OSA/CPAP. Check oxygen saturations every shift 11. Diastolic congestive heart failure. Monitor for any signs of fluid overload. Lasix 40 mg daily 12. Hypertension: Monitor BP 13. Morbid obesity. BMI 48.56. Dietary follow-up 14. BPH. Proscar 5 mg daily. Check PVRs 3 15. Hyperlipidemia. Lipitor 16. History of bilateral TKA. Patient used a Programmer, multimedia prior to admission   Post Admission Physician Evaluation: 1. Preadmission assessment reviewed and changes made below. 2. Functional deficits secondary  to left  medullary infarct. 3. Patient is admitted to receive collaborative, interdisciplinary care between the physiatrist, rehab nursing staff, and therapy team. 4. Patient's level of medical complexity and substantial therapy needs in context of that medical necessity cannot be provided at a lesser intensity of care such as a SNF. 5. Patient has experienced substantial functional loss from his/her baseline which was documented above under the "Functional History" and "Functional Status" headings.  Judging by the patient's diagnosis, physical exam, and functional history, the patient has potential for functional progress which will result in measurable gains while on inpatient rehab.  These gains will be of substantial and practical use upon discharge  in facilitating mobility and self-care at the household level. 6. Physiatrist will provide 24 hour management of medical needs as well as oversight of the therapy plan/treatment and provide guidance as appropriate regarding the interaction of the two. 7. The Preadmission Screening has been reviewed and patient status is unchanged unless otherwise stated above. 8. 24 hour rehab nursing will assist with safety, disease management and patient education  and help integrate therapy concepts, techniques,education, etc. 9. PT will assess and treat for/with: Lower extremity strength, range of motion, stamina, balance, functional mobility, safety, adaptive techniques and equipment,  coping skills, pain control, stroke education.  Goals are: Mod I/Supervision. 10. OT will assess and treat for/with: ADL's, functional mobility, safety, upper extremity strength, adaptive techniques and equipment, ego support, and community reintegration.   Goals are: Mod I/Supervison. Therapy may proceed with showering this patient. 11. SLP will assess and treat for/with: swallowing.  Goals are: Mod I/Ind. 12. Case Management and Social Worker will assess and treat for psychological issues and  discharge planning. 33. Team conference will be held [name] to assess progress toward goals and to determine barriers to discharge. 14. Patient will receive at least 3 hours of therapy per day at least 5 days per week. 15. ELOS: 11-15 days.       16. Prognosis:  good  Delice Lesch, MD, Mellody Drown Cathlyn Parsons., PA-C 06/21/2016

## 2016-06-21 NOTE — Evaluation (Signed)
Speech Language Pathology Evaluation Patient Details Name: JAIRO BELLEW MRN: [id number] DOB: 1945/06/02 Today's Date: 06/21/2016 Time: 4008-6761 SLP Time Calculation (min) (ACUTE ONLY): 26 min  Problem List:  Patient Active Problem List   Diagnosis Date Noted  . Left-sided weakness   . History of pulmonary embolism   . History of total knee arthroplasty, bilateral   . Dysphagia, post-stroke   . Hypokalemia   . Ataxia, post-stroke   . Neurologic gait disorder   . Headache 06/19/2016  . Claudication ([place]) 04/28/2016  . Encounter for monitoring flecainide therapy 04/28/2016  . Shortness of breath 04/28/2016  . Pure hyperglyceridemia 04/26/2016  . Skin lesion of chest wall 04/26/2016  . Recurrent pulmonary emboli ([place]) 06/13/2015  . PE (pulmonary embolism) 06/13/2015  . GERD (gastroesophageal reflux disease) 06/13/2015  . Asthma 06/13/2015  . Basal cell carcinoma of skin 06/28/2014  . 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 Sinus bradycardia 12/15/2013  . Abnormal serum level of alkaline phosphatase 08/01/2013  . PAF (paroxysmal atrial fibrillation) ([name]) 07/29/2013  . Chronic anticoagulation 07/22/2013  . Gastric ulcer with hemorrhage 07/20/13 07/22/2013  . Pulmonary thromboembolism- 07/16/13 07/16/2013  . Pericardial effusion s/p pericardial window 07/18/13 400cc 07/16/2013  . Diastolic congestive heart failure ([place]) 07/16/2013  . H/O laparoscopic adjustable gastric banding & hiatal hernia repair 04/08/13 04/23/2013  . Prediabetes 02/05/2013  . TMJ (temporomandibular joint syndrome) 09/11/2012  . Morbid obesity ([place]) 08/09/2012  . Peripheral neuropathy ([place]) 11/02/2009  . Hyperlipidemia with target LDL less than 130 09/10/2008  . Depression 09/10/2008  . Obstructive sleep apnea 09/10/2008  . GOUT 06/28/2006  . HTN (hypertension) 06/28/2006  . BPH (benign prostatic hyperplasia) 06/28/2006   Past Medical History:  Past Medical History:  Diagnosis Date  .  Anemia 07/29/2013  . Asthma    childhood asthma  . Benign prostatic hypertrophy    several prostate biopsies neg for cancer.   . Bilateral hip pain   . Cancer ([place])   . CHF (congestive heart failure) ([place])   . Complication of anesthesia    MORPHINE CAUSES HALLUCINATIONS  . Depression   . FH: colonic polyps 2010  . Gout   . History of kidney stones   . Hypertension   . Kidney cysts   . Morbid obesity ([place])   . Numbness    feet / legs  . PAF (paroxysmal atrial fibrillation) with RVR 07/29/2013  . Sleep apnea    USES C PAP   Past Surgical History:  Past Surgical History:  Procedure Laterality Date  . ARTHROSCOPY KNEE W/ DRILLING    . BREATH TEK H PYLORI N/A 12/23/2012   Procedure: BREATH TEK H PYLORI;  Surgeon: Gayland Curry, MD;  Location: [facility];  Service: General;  Laterality: N/A;  . CHOLECYSTECTOMY  1989  . COLONOSCOPY N/A 07/31/2013   Procedure: COLONOSCOPY;  Surgeon: Gatha Mayer, MD;  Location: [place];  Service: Endoscopy;  Laterality: N/A;  . ESOPHAGOGASTRODUODENOSCOPY N/A 07/22/2013   Procedure: ESOPHAGOGASTRODUODENOSCOPY (EGD);  Surgeon: Irene Shipper, MD;  Location: [facility] ENDOSCOPY;  Service: Endoscopy;  Laterality: N/A;  . ESOPHAGOGASTRODUODENOSCOPY N/A 07/31/2013   Procedure: ESOPHAGOGASTRODUODENOSCOPY (EGD);  Surgeon: Gatha Mayer, MD;  Location[facility] ENDOSCOPY;  Service: Endoscopy;  Laterality: N/A;  . INTRAOPERATIVE TRANSESOPHAGEAL ECHOCARDIOGRAM N/A 07/18/2013   Procedure: INTRAOPERATIVE TRANSESOPHAGEAL ECHOCARDIOGRAM;  Surgeon: Grace Isaac, MD;  Location: [facility];  Service: Open Heart Surgery;  Laterality: N/A;  . LAPAROSCOPIC GASTRIC BANDING WITH HIATAL HERNIA REPAIR N/A 04/08/2013  Procedure: LAPAROSCOPIC GASTRIC BANDING WITH possible  HIATAL HERNIA REPAIR;  Surgeon: Gayland Curry, MD;  Location: [facility];  Service: General;  Laterality: N/A;  . LITHOTRIPSY    . renal calculi    . SUBXYPHOID PERICARDIAL WINDOW N/A 07/18/2013   Procedure: SUBXYPHOID  PERICARDIAL WINDOW;  Surgeon: Grace Isaac, MD;  Location: [place];  Service: Thoracic;  Laterality: N/A;  . TONSILLECTOMY    . total knee replacment     bilat  . trigger finger repair     also lue, cts surgically repaired  . WISDOM TOOTH EXTRACTION     HPI:  71 yo male adm to [facility] with headache, found to have single focus of remote microhemorrhage in the L Pon, abnormal signal within V4 segment of L vertebral artery, faint focus of reduced diffusion within L medulla concerning for small infarct. Pt with worsening neurological symptoms during hospital stay including left sided weakness, dysphagia, vision deficits and dizziness.  Pt passed RNSSS initially- SlP swallow evaluation ordered due to pt's sudden onset of dysphagia.     Assessment / Plan / Recommendation Clinical Impression  Pt presents with facial, hypoglossal and vagal nerve deficits impacting his articulation and phonatory abilities. His is dysphonic producing a weak, breathy voice, however he compensates well by increased phonatory strength when listener does not hear him.  MOCA (for blind due to pt's double vision and dizziness) administered with pt scoring 21/22 - WNL. He demonstrates excellent understanding of his CVA consequences and asks high level questions indicative of high level cognition.  In addition, pt's semantic range is excellent on fluency task.  SlP follow up indicated for pt's dysphagia and dysarthria.        SLP Assessment  SLP Recommendation/Assessment: Patient needs continued Speech Lanaguage Pathology Services SLP Visit Diagnosis: Dysphagia, oropharyngeal phase (R13.12)    Follow Up Recommendations  Inpatient Rehab    Frequency and Duration min 2x/week  1 week      SLP Evaluation Cognition  Overall Cognitive Status: Within Functional Limits for tasks assessed Arousal/Alertness: Awake/alert Orientation Level: Oriented X4 Attention: Sustained (pt HOH impairing testing of auditory selective  hearing) Sustained Attention: Appears intact Memory: Appears intact (recalled 5/5 word without cues after 5 minutes on MOCA ) Awareness: Appears intact Problem Solving: Appears intact Safety/Judgment: Appears intact       Comprehension  Auditory Comprehension Overall Auditory Comprehension: Appears within functional limits for tasks assessed Yes/No Questions: Not tested Commands: Within Functional Limits Conversation: Complex Visual Recognition/Discrimination Discrimination: Not tested Reading Comprehension Reading Status: Not tested (pt reports double vision and dizziness with focusing)    Expression Expression Primary Mode of Expression: Verbal Verbal Expression Overall Verbal Expression: Appears within functional limits for tasks assessed Initiation: No impairment Repetition: No impairment (pt missed 2 articles in sentence of 13 words) Naming: No impairment Pragmatics: No impairment Written Expression Dominant Hand: Right Written Expression: Not tested   Oral / Motor  Oral Motor/Sensory Function Overall Oral Motor/Sensory Function: Moderate impairment Facial ROM: Suspected CN VII (facial) dysfunction Facial Symmetry: Suspected CN VII (facial) dysfunction Facial Strength: Suspected CN VII (facial) dysfunction Facial Sensation: Within Functional Limits Lingual ROM: Suspected CN XII (hypoglossal) dysfunction Lingual Symmetry: Within Functional Limits Lingual Strength: Suspected CN XII (hypoglossal) dysfunction Velum: Impaired left;Suspected CN X (Vagus) dysfunction (deviates to right) Motor Speech Respiration: Impaired Level of Impairment: Word Phonation: Breathy;Low vocal intensity Articulation: Impaired Level of Impairment: Word Intelligibility: Intelligible Motor Planning: Witnin functional limits Motor Speech Errors: Not applicable Effective Techniques: Increased  vocal intensity;Slow rate (pt self corrects to increase amplitude with listener subtle question)    GO                    Macario Golds 06/21/2016, 9:31 AM  Luanna Salk, [facility] SLP [phone]

## 2016-06-21 NOTE — Progress Notes (Addendum)
Occupational Therapy Treatment Patient Details Name: [name] MRN: [id number] DOB: May 10, 1945 Today's Date: 06/21/2016    History of present illness 71 yo male admitted with single focus of remote microhemorrhage in the L Pon, abnormal signal within V4 segment of L vertebral artery, faint focus of reduced diffusion within L medulla concerning for small infarct   OT comments  Pt making progress towards OT goals this session. Pt with new complaints of diplopia and headache (also noted in Neurology NP note). Verbal order to use partial occlusion of glasses to reduce symptoms of diplopia and maximize functional independence. Pt also experiencing dizziness with movement- did better when movements are slow. Pt continues to benefit from skilled OT in the acute setting, and requires CIR level therapy to maximize safety and independence in ADL and functional transfers.  Follow Up Recommendations  CIR    Equipment Recommendations  Other (comment) (defer to next venue)    Recommendations for Other Services      Precautions / Restrictions Precautions Precautions: Fall Precaution Comments: tendency to lean Lt Restrictions Weight Bearing Restrictions: No       Mobility Bed Mobility               General bed mobility comments: pt up in chair upon arrival  Transfers Overall transfer level: Needs assistance Equipment used: Rolling walker (2 wheeled) Transfers: Sit to/from Stand Sit to Stand: +2 physical assistance;Mod assist         General transfer comment: cues for slow movements, decreasing reports of dizziness. Pt able to stand with rw and +2 assist X2 minutes. Pt briefly releasing rw. With fatigue, patient's hips drifting Lt, modest improvement with postural cues.     Balance Overall balance assessment: Needs assistance Sitting-balance support: No upper extremity supported;Feet supported Sitting balance-Leahy Scale: Fair     Standing balance support: Bilateral  upper extremity supported Standing balance-Leahy Scale: Poor Standing balance comment: requiring physical assist for standing                   ADL Overall ADL's : Needs assistance/impaired     Grooming: Wash/dry hands;Wash/dry face;Set up Grooming Details (indicate cue type and reason): in recliner                 Toilet Transfer: Moderate assistance;+2 for physical assistance;+2 for safety/equipment;RW;Stand-pivot Toilet Transfer Details (indicate cue type and reason): clinical judgement         Functional mobility during ADLs: Moderate assistance;+2 for safety/equipment;Rolling walker General ADL Comments: Pt complaining of vertigo and when head moves he gets very lightheaded. Pt overall continues to be at a mod A level for bathing, dressing.      Vision Eye Alignment: Within Functional Limits Alignment/Gaze Preference: Within Defined Limits Ocular Range of Motion: Within Functional Limits Tracking/Visual Pursuits: Able to track stimulus in all quads without difficulty   Convergence: Other (comment) Diplopia Assessment: Disappears with one eye closed;Present all the time/all directions;Other (comment) (disappears with tape on Pt's glasses) Depth Perception:  [facility]) Additional Comments: Pt complains that he "feels like he is crosseyed and seeing double" but eye movements WFL - no beating, no gaze preference   Perception     Praxis      Cognition   Behavior During Therapy: WFL for tasks assessed/performed Overall Cognitive Status: Within Functional Limits for tasks assessed                         Exercises  Shoulder Instructions       General Comments      Pertinent Vitals/ Pain       Pain Assessment: Faces Faces Pain Scale: Hurts even more Pain Location: head Pain Descriptors / Indicators: Headache Pain Intervention(s): Monitored during session;Limited activity within patient's tolerance  Home Living Family/patient expects to be  discharged to:: Private residence Living Arrangements: Spouse/significant other                                      Prior Functioning/Environment              Frequency  Min 3X/week        Progress Toward Goals  OT Goals(current goals can now be found in the care plan section)  Progress towards OT goals: Progressing toward goals  Acute Rehab OT Goals Patient Stated Goal: be active again OT Goal Formulation: With patient/family Time For Goal Achievement: 07/10/16 Potential to Achieve Goals: Good  Plan Discharge plan remains appropriate    Co-evaluation    PT/OT/SLP Co-Evaluation/Treatment: Yes Reason for Co-Treatment: Complexity of the patient's impairments (multi-system involvement);For patient/therapist safety;To address functional/ADL transfers PT goals addressed during session: Mobility/safety with mobility OT goals addressed during session: ADL's and self-care      End of Session Equipment Utilized During Treatment: Gait belt;Rolling walker  OT Visit Diagnosis: Unsteadiness on feet (R26.81)   Activity Tolerance Patient tolerated treatment well   Patient Left in chair;with call bell/phone within reach   Nurse Communication Mobility status;Precautions        Time: 1124-1150 OT Time Calculation (min): 26 min  Charges: OT General Charges $OT Visit: 1 Procedure OT Treatments $Self Care/Home Management : 8-22 mins  Hulda Humphrey OTR/L [place] 06/21/2016, 1:54 PM   Addendum: clarified verbal orders for partial occlusion of glasses

## 2016-06-21 NOTE — Progress Notes (Signed)
Assessed 0715

## 2016-06-21 NOTE — Progress Notes (Signed)
ANTICOAGULATION CONSULT NOTE - Initial Consult  Pharmacy Consult for apixaban Indication: Nonvalvular Atrial Fibrillation  Allergies  Allergen Reactions  . Morphine Other (See Comments)    Nausea and Severe hallucinations  . Codeine Nausea Only  . Penicillins Other (See Comments)    LOC with shot  . Propoxyphene N-Acetaminophen Nausea Only   Patient Measurements: Weight: (S) (!) 358 lb 0.4 oz (162.4 kg)  Vital Signs: Temp: 98.3 F (36.8 C) (03/21 0917) Temp Source: Oral (03/21 0917) BP: 144/75 (03/21 0917) Pulse Rate: 64 (03/21 0917)  Labs:  Recent Labs  06/19/16 0223 06/19/16 0233 06/20/16 1929 06/21/16 0614  HGB 14.6 14.6  --  14.4  HCT 44.4 43.0  --  45.1  PLT 183  --   --  160  APTT 25  --  61* 64*  LABPROT 14.2  --   --   --   INR 1.09  --   --   --   HEPARINUNFRC  --   --  1.10* 0.96*  CREATININE 1.07 1.00  --  1.05    Estimated Creatinine Clearance: 103.2 mL/min (by C-G formula based on SCr of 1.05 mg/dL).  Medical History: Past Medical History:  Diagnosis Date  . Anemia 07/29/2013  . Asthma    childhood asthma  . Benign prostatic hypertrophy    several prostate biopsies neg for cancer.   . Bilateral hip pain   . Cancer ([place])   . CHF (congestive heart failure) ([place])   . Complication of anesthesia    MORPHINE CAUSES HALLUCINATIONS  . Depression   . FH: colonic polyps 2010  . Gout   . History of kidney stones   . Hypertension   . Kidney cysts   . Morbid obesity ([place])   . Numbness    feet / legs  . PAF (paroxysmal atrial fibrillation) with RVR 07/29/2013  . Sleep apnea    USES C PAP   Medications:  Prescriptions Prior to Admission  Medication Sig Dispense Refill Last Dose  . apixaban (ELIQUIS) 5 MG TABS tablet Take 1 tablet (5 mg total) by mouth 2 (two) times daily. 180 tablet 3 06/18/2016 at am  . aspirin 81 MG tablet Take 81 mg by mouth 2 (two) times daily.    06/18/2016 at am  . budesonide-formoterol (SYMBICORT) 160-4.5 MCG/ACT inhaler  Inhale 2 puffs into the lungs 2 (two) times daily as needed (shortness of breath).   unk  . CALCIUM PO Take 500 mg by mouth 3 (three) times daily.    06/18/2016 at Unknown time  . Cyanocobalamin (VITAMIN B-12) 1000 MCG SUBL Place 1,000 mcg under the tongue every evening.    06/18/2016 at Unknown time  . Docusate Sodium (DSS) 100 MG CAPS Take 100 mg by mouth every other day.   Past Week at Unknown time  . finasteride (PROSCAR) 5 MG tablet Take 5 mg by mouth daily.    06/18/2016 at Unknown time  . flecainide (TAMBOCOR) 50 MG tablet TAKE ONE TABLET BY MOUTH EVERY 12 HOURS 180 tablet 2 06/18/2016 at am  . furosemide (LASIX) 20 MG tablet TAKE 1 TAB ONCE A DAY AS NEEDED FOR SWELLING IN THE LEGS (Patient taking differently: TAKE 1 TAB ONCE A DAY FOR SWELLING IN THE LEGS) 90 tablet 1 06/18/2016 at Unknown time  . loratadine (CLARITIN) 10 MG tablet Take 10 mg by mouth daily.   06/18/2016 at Unknown time  . LORazepam (ATIVAN) 0.5 MG tablet TAKE ONE TABLET BY MOUTH ONCE DAILY IN  THE MORNING AND TWO AT BEDTIME 90 tablet 2 06/18/2016 at am  . montelukast (SINGULAIR) 10 MG tablet TAKE 1 TABLET BY MOUTH ONCE A DAY AT BEDTIME 30 tablet 1 06/18/2016 at Unknown time  . naphazoline-pheniramine (NAPHCON-A) 0.025-0.3 % ophthalmic solution Place 1 drop into both eyes 4 (four) times daily as needed for irritation.   unk  . pantoprazole (PROTONIX) 40 MG tablet Take 1 tablet (40 mg total) by mouth 2 (two) times daily. 180 tablet 0 06/18/2016 at am  . risperiDONE (RISPERDAL) 0.5 MG tablet Take 1 tablet (0.5 mg total) by mouth at bedtime. 30 tablet 2 06/17/2016  . rOPINIRole (REQUIP) 0.25 MG tablet TAKE THREE TABLETS BY MOUTH AT BEDTIME OR  AS  DIRECTED  FOR  LEG  JERKS 90 tablet 11 06/17/2016  . sertraline (ZOLOFT) 100 MG tablet TAKE ONE AND ONE-HALF TABLETS BY MOUTH ONCE DAILY WITH  BREAKFAST 45 tablet 2 06/18/2016 at Unknown time  . [DISCONTINUED] amLODipine-benazepril (LOTREL) 5-10 MG capsule Take 1 capsule by mouth daily. 30 capsule 5  06/18/2016 at Unknown time  . [DISCONTINUED] atorvastatin (LIPITOR) 40 MG tablet Take 1 tablet (40 mg total) by mouth daily. 90 tablet 0 06/18/2016 at Unknown time   Assessment: 71 y.o.malewith a complex cardiac medical history significant for Afib and PE on Eliquis. History of GI bleed/ stomach ulcers while on Xarelto in the past. Neurologic workup showed possible left medullary small acute infarct along with possible left vertebral artery occlusion. Patient is now transitioning to back to PTA PO anticoagulation from a heparin inufsion. Most recent aPTT slightly subtherapeutic, CBC stable, with no bleeding reported.   Goal of Therapy:  aPTT 65-85 seconds Monitor platelets by anticoagulation protocol: Yes   Plan:  Apixaban 5mg  BID as taking prior to admission Monitor renal function and for s/sx of bleeding  Georga Bora, PharmD Clinical Pharmacist Pager: [phone] 06/21/2016 10:08 AM

## 2016-06-21 NOTE — PMR Pre-admission (Signed)
PMR Admission Coordinator Pre-Admission Assessment  Patient: [name] is an 71 y.o., male MRN: [id number] DOB: [birth year] Height:   Weight: (S) (!) 162.4 kg (358 lb 0.4 oz)              Insurance Information HMO:     PPO:      PCP:      IPA:      80/20:      OTHER:  PRIMARY: Medicare A & B      Policy#: [id number]      Subscriber: Self CM Name:       Phone#:      Fax#:  Pre-Cert#: eligible per [employer]       Employer: Retired  Benefits:  Phone #:      Name:  Eff. Date: 04/03/2008     Deduct: $1340      Out of Pocket Max: N/A     Life Max: N/A CIR: 100%      SNF: 100% days 1-20; 80% days 21-100  Outpatient: 80%     Co-Pay: 20% Home Health: 100%      Co-Pay: 0 DME: 80%     Co-Pay: 20% Providers: patient's choice  SECONDARY: [employer]      Policy#: [id number]      Subscriber: Self CM Name:       Phone#:      Fax#:  Pre-Cert#:       Employer: Retired Benefits:  Phone #: [phone]     Name:  Eff. Date:      Deduct:       Out of Pocket Max:       Life Max:  CIR:       SNF:  Outpatient:      Co-Pay:  Home Health:       Co-Pay:  DME:      Co-Pay:   Medicaid Application Date:       Case Manager:  Disability Application Date:       Case Worker:   Emergency Contact Information Contact Information    Name Relation Home Work Mobile   [place] Spouse [phone]  [phone]   [place], [place] [phone]  [phone]     Current Medical History  Patient Admitting Diagnosis: Left medullary infarct   History of Present Illness: Aniket Paye Cockmanis a 71 y.o.right handed malewith history of atrial fibrillation and pulmonary emboli maintained on Eliquisas well as low-dose aspirin, hypertension, history of pericardial effusion s/pwindow, OSA on CPAP, diastolic congestive heart failure, bilateral TKA, morbid obesity with gastric banding procedure. Per chart review, patient patient lives with spouse. Independent prior to admissionusing a cane and walker  at times due to history of knee replacement. One level home with 7 steps to entry.Wife with limited physical assist with history of CABG. Son in the area works.Presented 06/19/2016 with headache of sudden onset while watching television and lean to the left side as well as slurred speech. Cranial CT scan negative. Patient did not receive TPA. 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 No proximal occlusion of the intracranial arteries. No significant stenosis or dissection of the carotid arteries. 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 Interventional radiology consulted no need for emergent endovascular treatment at present time. Echocardiogram with ejection fraction 87% grade 1 diastolic dysfunction. Swallow study completed 06/21/2016 placed on a mechanical soft thin liquid diet. Neurology services consult infarct felt  to be embolic secondary to known atrial fibrillation and remains on Eliquis as well as aspirin. Physical and occupational therapy evaluations completed with recommendations of physical medicine rehabilitation consult. Patient was admitted for a comprehensive rehabilitation program   NIH Total: 2   Past Medical History  Past Medical History:  Diagnosis Date  . Anemia 07/29/2013  . Asthma    childhood asthma  . Benign prostatic hypertrophy    several prostate biopsies neg for cancer.   . Bilateral hip pain   . Cancer ([place])   . CHF (congestive heart failure) ([place])   . Complication of anesthesia    MORPHINE CAUSES HALLUCINATIONS  . CVA (cerebral vascular accident) ([place]) 06/2016  . Depression   . FH: colonic polyps 2010  . Gout   . History of kidney stones   . Hypertension   . Kidney cysts   . Morbid obesity ([place])   . Numbness    feet / legs  . PAF (paroxysmal atrial fibrillation) with RVR 07/29/2013  . Sleep apnea    USES C PAP    Family History  family history includes  Depression in his brother and mother; Diabetes in his cousin and paternal uncle; Heart disease in his father, paternal grandmother, and paternal uncle; Hypertension in his mother; Stroke in his paternal aunt.  Prior Rehab/Hospitalizations:  Has the patient had major surgery during 100 days prior to admission? No  Current Medications   Current Facility-Administered Medications:  .  acetaminophen (TYLENOL) tablet 1,000 mg, 1,000 mg, Oral, Q8H PRN, Edwin Dada, MD, 1,000 mg at 06/19/16 1849 .  apixaban (ELIQUIS) tablet 5 mg, 5 mg, Oral, BID, Thurnell Lose, MD, 5 mg at 06/21/16 1126 .  aspirin chewable tablet 81 mg, 81 mg, Oral, BID, Thurnell Lose, MD, 81 mg at 06/21/16 0954 .  atorvastatin (LIPITOR) tablet 80 mg, 80 mg, Oral, q1800, Thurnell Lose, MD .  finasteride (PROSCAR) tablet 5 mg, 5 mg, Oral, Daily, Edwin Dada, MD, 5 mg at 06/21/16 0934 .  flecainide (TAMBOCOR) tablet 50 mg, 50 mg, Oral, Q12H, Edwin Dada, MD, 50 mg at 06/21/16 1329 .  furosemide (LASIX) tablet 40 mg, 40 mg, Oral, Daily, Thurnell Lose, MD, 40 mg at 06/21/16 0954 .  hydrALAZINE (APRESOLINE) injection 10 mg, 10 mg, Intravenous, Q6H PRN, Thurnell Lose, MD .  LORazepam (ATIVAN) injection 0.5 mg, 0.5 mg, Intravenous, Q12H PRN, Thurnell Lose, MD .  metoprolol (LOPRESSOR) injection 5 mg, 5 mg, Intravenous, Q4H PRN, Thurnell Lose, MD .  mometasone-formoterol (DULERA) 200-5 MCG/ACT inhaler 2 puff, 2 puff, Inhalation, BID, Edwin Dada, MD .  [DISCONTINUED] ondansetron [facility]) tablet 4 mg, 4 mg, Oral, Q6H PRN, 4 mg at 06/19/16 0856 **OR** ondansetron (ZOFRAN) injection 4 mg, 4 mg, Intravenous, Q6H PRN, Edwin Dada, MD, 4 mg at 06/19/16 1537 .  pantoprazole (PROTONIX) EC tablet 40 mg, 40 mg, Oral, Daily, Thurnell Lose, MD, 40 mg at 06/21/16 0954 .  risperiDONE (RISPERDAL) tablet 0.5 mg, 0.5 mg, Oral, QHS, Edwin Dada, MD .  sertraline (ZOLOFT) tablet  150 mg, 150 mg, Oral, Daily, Edwin Dada, MD, 150 mg at 06/21/16 0933 .  sodium chloride flush (NS) 0.9 % injection 3 mL, 3 mL, Intravenous, Q12H, Edwin Dada, MD, 3 mL at 06/21/16 1127  Patients Current Diet: DIET DYS 3 Room service appropriate? Yes; Fluid consistency: Thin  Precautions / Restrictions Precautions Precautions: Fall Precaution Comments: tendency to lean Lt Restrictions Weight  Bearing Restrictions: No   Has the patient had 2 or more falls or a fall with injury in the past year?No, but reports fall farther out when in the yard.  Prior Activity Level Community (5-7x/wk): Prior to admission patient was fully independent but admits to nymbness in feet and balance issues on uneven surfaces.  He drove, ran errands with his wife, and worked on his stocks on the computer.   Home Assistive Devices / Equipment Home Assistive Devices/Equipment: Hearing aid, Eyeglasses, CPAP Home Equipment: Environmental consultant - 2 wheels, Cane - quad  Prior Device Use: Indicate devices/aids used by the patient prior to current illness, exacerbation or injury? None per patient's report, but note some mentioned above   Prior Functional Level Prior Function Level of Independence: Independent  Self Care: Did the patient need help bathing, dressing, using the toilet or eating?  Independent  Indoor Mobility: Did the patient need assistance with walking from room to room (with or without device)? Independent  Stairs: Did the patient need assistance with internal or external stairs (with or without device)? Independent  Functional Cognition: Did the patient need help planning regular tasks such as shopping or remembering to take medications? Independent  Current Functional Level Cognition  Arousal/Alertness: Awake/alert Overall Cognitive Status: Within Functional Limits for tasks assessed Orientation Level: Oriented X4 Attention: Sustained (pt HOH impairing testing of auditory selective  hearing) Sustained Attention: Appears intact Memory: Appears intact (recalled 5/5 word without cues after 5 minutes on MOCA ) Awareness: Appears intact Problem Solving: Appears intact Safety/Judgment: Appears intact    Extremity Assessment (includes Sensation/Coordination)  Upper Extremity Assessment: Defer to OT evaluation LUE Deficits / Details: drifting toward the L side, 4 out 5 MMT however able to break resistance  Lower Extremity Assessment: LLE deficits/detail LLE Deficits / Details: mild weakness compared to R LLE Sensation: history of peripheral neuropathy (in bilat LE)    ADLs  Overall ADL's : Needs assistance/impaired Eating/Feeding Details (indicate cue type and reason): pt reports change in voice quality and question need for SLP Grooming: Wash/dry hands, Wash/dry face, Set up Grooming Details (indicate cue type and reason): in recliner Upper Body Bathing: Minimal assistance Lower Body Bathing: Maximal assistance Lower Body Bathing Details (indicate cue type and reason): pt requires (A) for balance due to L lean Toilet Transfer: Moderate assistance, +2 for physical assistance, +2 for safety/equipment, RW, Stand-pivot Toilet Transfer Details (indicate cue type and reason): clinical judgement Functional mobility during ADLs: Moderate assistance, +2 for safety/equipment, Rolling walker General ADL Comments: Pt complaining of vertigo and when head moves he gets very lightheaded    Mobility  Overal bed mobility: Needs Assistance Bed Mobility: Supine to Sit, Sit to Supine Supine to sit: Min assist Sit to supine: Mod assist General bed mobility comments: pt up in chair upon arrival    Transfers  Overall transfer level: Needs assistance Equipment used: Rolling walker (2 wheeled) Transfers: Sit to/from Stand Sit to Stand: +2 physical assistance, Mod assist General transfer comment: cues for slow movements, decreasing reports of dizziness. Pt able to stand with rw and +2  assist X2 minutes. Pt briefly releasing rw. With fatigue, patient's hips drifting Lt, modest improvement with postural cues.     Ambulation / Gait / Stairs / Wheelchair Mobility  Ambulation/Gait Ambulation/Gait assistance: Mod assist, +2 physical assistance Ambulation Distance (Feet):  (3 side steps to [facility]) Assistive device: 2 person hand held assist General Gait Details: unable to attempt, pt reports LEs feel too weak right now.  Gait  velocity: slow Gait velocity interpretation: Below normal speed for age/gender    Posture / Balance Balance Overall balance assessment: Needs assistance Sitting-balance support: No upper extremity supported, Feet supported Sitting balance-Leahy Scale: Fair Standing balance support: Bilateral upper extremity supported Standing balance-Leahy Scale: Poor Standing balance comment: requiring physical assist for standing    Special needs/care consideration BiPAP/CPAP: Yes, CPAP and has home set-up with him CPM: No Continuous Drip IV: No Dialysis: No         Life Vest: No Oxygen: No Special Bed: No Trach Size: No Wound Vac (area): No       Skin: WDL                               Bowel mgmt: 06/19/16 continent  Bladder mgmt: urinal continent  Diabetic mgmt: HgbA1c  5.8     Previous Home Environment Living Arrangements: Spouse/significant other  Lives With: Spouse Available Help at Discharge:  (resides with wife) Type of Home: House Home Layout: One level Home Access: Stairs to enter Entrance Stairs-Rails: Left, Right Entrance Stairs-Number of Steps: 7 Bathroom Shower/Tub: Tub/shower unit and reports that he was unable to stand to shower prior to admission so he would get in the tub and bathe. Bathroom Toilet: Standard Home Care Services: No  Discharge Living Setting Plans for Discharge Living Setting: Patient's home, Lives with (comment) (Spouse) Type of Home at Discharge: House Discharge Home Layout: One level Discharge Home Access: Stairs  to enter Entrance Stairs-Rails: Right, Left (cannot reach both at the same time`) Entrance Stairs-Number of Steps: front: 7 steps Discharge Bathroom Shower/Tub: Tub/shower unit, Curtain (took baths in the tub as he could not stand for showers ) Discharge Bathroom Toilet: Standard Discharge Bathroom Accessibility: Yes How Accessible: Accessible via walker Does the patient have any problems obtaining your medications?: No  Social/Family/Support Systems Patient Roles: Spouse, Parent, Other (Comment) (recent grandparent; son has a 16 month old daughter ) Contact Information: Spouse: [name] [phone] Anticipated Caregiver: [name] Anticipated Caregiver's Contact Information: see above Ability/Limitations of Caregiver: Supervision only  Caregiver Availability: 24/7 Discharge Plan Discussed with Primary Caregiver: No (discussed with patient) Is Caregiver In Agreement with Plan?: Yes Does Caregiver/Family have Issues with Lodging/Transportation while Pt is in Rehab?: No  Goals/Additional Needs Patient/Family Goal for Rehab: PT/OT/SLP Mod I - Supervision  Expected length of stay: 15-19 days  Cultural Considerations: None Dietary Needs: Dys.3 textures, pureed meats, thin liquids no straws, meds whole in puree  Equipment Needs: TBD Special Service Needs: None Additional Information: None Pt/Family Agrees to Admission and willing to participate: Yes Program Orientation Provided & Reviewed with Pt/Caregiver Including Roles  & Responsibilities: Yes Additional Information Needs: None Information Needs to be Provided By: N/A  Decrease burden of Care through IP rehab admission: No  Possible need for SNF placement upon discharge: No  Patient Condition: This patient's condition remains as documented in the consult dated 06/20/16, in which the Rehabilitation Physician determined and documented that the patient's condition is appropriate for intensive rehabilitative care in an inpatient  rehabilitation facility. Will admit to inpatient rehab today.  Preadmission Screen Completed By:  Gunnar Fusi, 06/21/2016 2:17 PM ______________________________________________________________________   Discussed status with Dr. Posey Pronto on 06/21/16 at 1430 and received telephone approval for admission today.  Admission Coordinator:  Gunnar Fusi, time 1430/Date 06/21/16

## 2016-06-21 NOTE — Progress Notes (Signed)
Physical Therapy Treatment Patient Details Name: [name] MRN: [id number] DOB: 1946-04-01 Today's Date: 06/21/2016    History of Present Illness 71 yo male admitted with single focus of remote microhemorrhage in the L Pon, abnormal signal within V4 segment of L vertebral artery, faint focus of reduced diffusion within L medulla concerning for small infarct    PT Comments    Pt making gradual progress with mobility during PT sessions. PT able to stand in front of chair with +2 assist and rw (X2 min). With fatigue, patient having increased difficulty with posture and hips drifting toward Lt. Pt reporting having ongoing headache. Anticipating D/C to CIR following acute stay.    Follow Up Recommendations  CIR;Supervision/Assistance - 24 hour     Equipment Recommendations   (to be determined)    Recommendations for Other Services       Precautions / Restrictions Precautions Precautions: Fall Precaution Comments: tendency to lean Lt Restrictions Weight Bearing Restrictions: No    Mobility  Bed Mobility               General bed mobility comments: pt up in chair upon arrival  Transfers Overall transfer level: Needs assistance Equipment used: Rolling walker (2 wheeled) Transfers: Sit to/from Stand Sit to Stand: +2 physical assistance;Mod assist         General transfer comment: cues for slow movements, decreasing reports of dizziness. Pt able to stand with rw and +2 assist X2 minutes. Pt briefly releasing rw. With fatigue, patient's hips drifting Lt, modest improvement with postural cues.   Ambulation/Gait             General Gait Details: unable to attempt, pt reports LEs feel too weak right now.    Stairs            Wheelchair Mobility    Modified Rankin (Stroke Patients Only) Modified Rankin (Stroke Patients Only) Pre-Morbid Rankin Score: No symptoms Modified Rankin: Moderately severe disability     Balance Overall balance  assessment: Needs assistance Sitting-balance support: No upper extremity supported;Feet supported Sitting balance-Leahy Scale: Fair     Standing balance support: Bilateral upper extremity supported Standing balance-Leahy Scale: Poor Standing balance comment: requiring physical assist for standing                    Cognition Arousal/Alertness: Awake/alert Behavior During Therapy: WFL for tasks assessed/performed Overall Cognitive Status: Within Functional Limits for tasks assessed                      Exercises      General Comments General comments (skin integrity, edema, etc.): Pt reports feeling dizzy with change in positions, improving with slow movement.       Pertinent Vitals/Pain Pain Assessment: Faces Faces Pain Scale: Hurts even more Pain Location: head Pain Descriptors / Indicators: Headache Pain Intervention(s): Monitored during session;Limited activity within patient's tolerance    Home Living Family/patient expects to be discharged to:: Private residence Living Arrangements: Spouse/significant other                  Prior Function            PT Goals (current goals can now be found in the care plan section) Acute Rehab PT Goals Patient Stated Goal: be active again PT Goal Formulation: With patient/family Time For Goal Achievement: 07/03/16 Potential to Achieve Goals: Good Progress towards PT goals: Progressing toward goals    Frequency    Min  4X/week      PT Plan Current plan remains appropriate    Co-evaluation PT/OT/SLP Co-Evaluation/Treatment: Yes Reason for Co-Treatment: For patient/therapist safety PT goals addressed during session: Mobility/safety with mobility       End of Session Equipment Utilized During Treatment: Gait belt Activity Tolerance: Patient limited by fatigue Patient left: in chair;with call bell/phone within reach Nurse Communication: Mobility status PT Visit Diagnosis: Difficulty in walking,  not elsewhere classified (R26.2);History of falling (Z91.81)     Time: 4580-9983 PT Time Calculation (min) (ACUTE ONLY): 28 min  Charges:  $Therapeutic Activity: 8-22 mins                    G Codes:       Cassell Clement, PT, CSCS Pager [phone] Office [phone]  06/21/2016, 12:35 PM

## 2016-06-21 NOTE — Progress Notes (Signed)
Called report to [facility] RN inpatient rehab.

## 2016-06-21 NOTE — Progress Notes (Addendum)
  Speech Language Pathology Treatment: Dysphagia  Patient Details Name: [name] MRN: [id number] DOB: Jul 01, 1945 Today's Date: 06/21/2016 Time: 4132-4401 SLP Time Calculation (min) (ACUTE ONLY): 13 min  Assessment / Plan / Recommendation Clinical Impression  Clinically pt with improved swallow function but continues to be dysphonic.  SLP observed pt consume ice chips and tsp applesauce.  Pt reports adequate clearance of ice but residuals with puree - *not as much as during previous date.  Verbal cues to conduct dry swallows, cough and expectorate to clear effective clinically.  No overt indication of aspiration however with medullary CVA - can not rule out sensory deficits.  Recommend proceed with MBS today to determine readiness for po diet.  Pt, RN, MD and CIR PA/AC aware and agree to plan.     HPI HPI: 71 yo male adm to [facility] with headache, found to have single focus of remote microhemorrhage in the L Pon, abnormal signal within V4 segment of L vertebral artery, faint focus of reduced diffusion within L medulla concerning for small infarct. Pt with worsening neurological symptoms during hospital stay including left sided weakness, dysphagia, vision deficits and dizziness.  Pt passed RNSSS initially- SlP swallow evaluation ordered due to pt's sudden onset of dysphagia.        SLP Plan   MBS       Recommendations  Diet recommendations: NPO Medication Administration: Via alternative means                Oral Care Recommendations: Oral care QID Follow up Recommendations: Inpatient Rehab SLP Visit Diagnosis: Dysphagia, oropharyngeal phase (R13.12)       GO                [name], [name] SLP [phone]

## 2016-06-21 NOTE — Discharge Summary (Signed)
[name] PPI:[id number] DOB: March 21, 1946 DOA: 06/19/2016  PCP: [name] Calico, MD  Admit date: 06/19/2016  Discharge date: 06/21/2016  Admitted From: Home   Disposition:  CIR   Recommendations for Outpatient Follow-up:   Follow up with PCP in 1-2 weeks  PCP Please obtain BMP/CBC, 2 view CXR in 1week,  (see Discharge instructions)   PCP Please follow up on the following pending results: None   Home Health: None   Equipment/Devices: None  Consultations: Neuro Discharge Condition: Fair   CODE STATUS: Ful   Diet Recommendation: DIET DYS 3 with feeding assistance and aspiration precautions  Chief Complaint  Patient presents with  . Code Stroke    LSN [id number]     Brief history of present illness from the day of admission and additional interim summary     Breandan A Cockmanis a 71 y.o.malewith a past medical history significant for Afib and PE on Eliquis, HFpEF, HTN, hx of pericardial effusion s/p window, and hx of OSA on CPAPwho presents with acute headache and ataxia. His workup showed possible left medullary small acute infarct along with possible left vertebral artery occlusion.                                                                 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 His LDL is 77 so will bump is statin dose to 80mg  , A1c is < 6. Echo is unremarkable, carotid angiogram also noted. Continue neurology follow-up at [facility] on with supportive care.  2. Dysphagia.  Likely due to #1 above. Seen by speech underwent modified barium, currently on dysphagia 3 diet will need continued assistance with speech and swallowing issues.  3. History of PE. Continue anticoagulation.  4. Hypertension. Allow for permissive hypertension, resume home blood pressure medication from 06/22/2016.  5. BPH. Continue Proscar .  6. GERD. On PPI .  7. Paroxysmal atrial fibrillation. [place] vasc 2 score of at least 4. Continue anticoagulation and flecainide.  8. Mild acute on chronic diastolic CHF last EF 60 - 65 %. Repeat echo stable, was diuresed with IV Lasix 1 day, back to baseline continue home dose as needed Lasix, monitor intake output and weight closely.  9. Hypokalemia. Replaced and stable.  10. Mood disorder. Continue home medications unchanged.  11. Morbid obesity with obstructive sleep apnea. Follow with PCP for weight loss, CPAP daily at bedtime.  Discharge diagnosis     Principal Problem:   Headache Active  Problems:   HTN (hypertension)   Obstructive sleep apnea   Morbid obesity (HCC)   Diastolic congestive heart failure (HCC)   PAF (paroxysmal atrial fibrillation) (HCC)   Asthma   Left-sided weakness   History of pulmonary embolism   History of total knee arthroplasty, bilateral   Dysphagia, post-stroke   Hypokalemia   Ataxia, post-stroke   Neurologic gait disorder    Discharge instructions    Discharge Instructions    Discharge instructions    Complete by:  As directed    Follow with Primary MD [name] Calico, MD in 7 days   Get CBC, CMP, 2 view Chest X ray checked  by Primary MD or SNF MD in 5-7 days ( we routinely change or add medications that can affect your baseline labs and fluid status, therefore we recommend that you get the mentioned basic workup next visit with your PCP, your PCP may decide not to get them or add new tests based on their clinical decision)  Activity: As tolerated with Full fall precautions use walker/cane &  assistance as needed  Disposition CIR  Diet:   DIET DYS 3 with feeding assistance and aspiration precautions.  For Heart failure patients - Check your Weight same time everyday, if you gain over 2 pounds, or you develop in leg swelling, experience more shortness of breath or chest pain, call your Primary MD immediately. Follow Cardiac Low Salt Diet and 1.5 lit/day fluid restriction.  On your next visit with your primary care physician please Get Medicines reviewed and adjusted.  Please request your Prim.MD to go over all Hospital Tests and Procedure/Radiological results at the follow up, please get all Hospital records sent to your Prim MD by signing hospital release before you go home.  If you experience worsening of your admission symptoms, develop shortness of breath, life threatening emergency, suicidal or homicidal thoughts you must seek medical attention immediately by calling 911 or calling your MD immediately  if symptoms less severe.  You Must read complete instructions/literature along with all the possible adverse reactions/side effects for all the Medicines you take and that have been prescribed to you. Take any new Medicines after you have completely understood and accpet all the possible adverse reactions/side effects.   Do not drive, operate heavy machinery, perform activities at heights, swimming or participation in water activities or provide baby sitting services if your were admitted for syncope or siezures until you have seen by Primary MD or a Neurologist and advised to do so again.  Do not drive when taking Pain medications.    Do not take more than prescribed Pain, Sleep and Anxiety Medications  Special Instructions: If you have smoked or chewed Tobacco  in the last 2 yrs please stop smoking, stop any regular Alcohol  and or any Recreational drug use.  Wear Seat belts while driving.   Please note  You were cared for by a hospitalist during your hospital stay. If  you have any questions about your discharge medications or the care you received while you were in the hospital after you are discharged, you can call the unit and asked to speak with the hospitalist on call if the hospitalist that took care of you is not available. Once you are discharged, your primary care physician will handle any further medical issues. Please note that NO REFILLS for any discharge medications will be authorized once you are discharged, as it is imperative that you return to your primary care physician (or establish a  relationship with a primary care physician if you do not have one) for your aftercare needs so that they can reassess your need for medications and monitor your lab values.   Increase activity slowly    Complete by:  As directed       Discharge Medications   Allergies as of 06/21/2016      Reactions   Morphine Other (See Comments)   Nausea and Severe hallucinations   Codeine Nausea Only   Penicillins Other (See Comments)   LOC with shot   Propoxyphene N-acetaminophen Nausea Only      Medication List    TAKE these medications   amLODipine-benazepril 5-10 MG capsule Commonly known as:  LOTREL Take 1 capsule by mouth daily. Start taking on:  06/22/2016   apixaban 5 MG Tabs tablet Commonly known as:  ELIQUIS Take 1 tablet (5 mg total) by mouth 2 (two) times daily.   aspirin 81 MG tablet Take 81 mg by mouth 2 (two) times daily.   atorvastatin 80 MG tablet Commonly known as:  LIPITOR Take 1 tablet (80 mg total) by mouth daily. What changed:  medication strength  how much to take   budesonide-formoterol 160-4.5 MCG/ACT inhaler Commonly known as:  SYMBICORT Inhale 2 puffs into the lungs 2 (two) times daily as needed (shortness of breath).   CALCIUM PO Take 500 mg by mouth 3 (three) times daily.   DSS 100 MG Caps Take 100 mg by mouth every other day.   finasteride 5 MG tablet Commonly known as:  PROSCAR Take 5 mg by mouth daily.     flecainide 50 MG tablet Commonly known as:  TAMBOCOR TAKE ONE TABLET BY MOUTH EVERY 12 HOURS   furosemide 20 MG tablet Commonly known as:  LASIX TAKE 1 TAB ONCE A DAY AS NEEDED FOR SWELLING IN THE LEGS What changed:  See the new instructions.   loratadine 10 MG tablet Commonly known as:  CLARITIN Take 10 mg by mouth daily.   LORazepam 0.5 MG tablet Commonly known as:  ATIVAN TAKE ONE TABLET BY MOUTH ONCE DAILY IN THE MORNING AND TWO AT BEDTIME   montelukast 10 MG tablet Commonly known as:  SINGULAIR TAKE 1 TABLET BY MOUTH ONCE A DAY AT BEDTIME   naphazoline-pheniramine 0.025-0.3 % ophthalmic solution Commonly known as:  NAPHCON-A Place 1 drop into both eyes 4 (four) times daily as needed for irritation.   pantoprazole 40 MG tablet Commonly known as:  PROTONIX Take 1 tablet (40 mg total) by mouth 2 (two) times daily.   risperiDONE 0.5 MG tablet Commonly known as:  RISPERDAL Take 1 tablet (0.5 mg total) by mouth at bedtime.   rOPINIRole 0.25 MG tablet Commonly known as:  REQUIP TAKE THREE TABLETS BY MOUTH AT BEDTIME OR  AS  DIRECTED  FOR  LEG  JERKS   sertraline 100 MG tablet Commonly known as:  ZOLOFT TAKE ONE AND ONE-HALF TABLETS BY MOUTH ONCE DAILY WITH  BREAKFAST   Vitamin B-12 1000 MCG Subl Place 1,000 mcg under the tongue every evening.       Follow-up Information    [name] Calico, MD. Schedule an appointment as soon as possible for a visit in 1 week(s).   Specialty:  Internal Medicine Why:  after discharge from CIR Contact information: [address] [place] [address] [phone]        SETHI,PRAMOD, MD. Schedule an appointment as soon as possible for a visit in 3 week(s).   Specialties:  Neurology, Radiology Contact information: [address] [place] [address] [phone]           Major procedures and Radiology Reports - PLEASE review detailed and final reports thoroughly  -      CT-MRI Brain, CTA  Head and Neck - Possible small left medical infarct along with possible left vertebral artery occlusion.  TTE - Left ventricle: The cavity size was moderately dilated. Wallthickness was increased in a pattern of mild LVH. Systolicfunction was normal. The estimated ejection fraction was in therange of 60% to 65%. Wall motion was normal; there were noregional wall motion abnormalities. Doppler parameters areconsistent with abnormal left ventricular relaxation (grade 1diastolic dysfunction). Doppler parameters are consistent withindeterminate ventricular filling pressure. - Aortic valve: Transvalvular velocity was within the normal range.There was no stenosis. There was no regurgitation. - Mitral valve: Transvalvular velocity was within the normal range.There was no evidence for stenosis. There was no regurgitation. - Left atrium: The atrium was mildly dilated. - Right ventricle: The cavity size was normal. Wall thickness wasnormal. Systolic function was normal. - Right atrium: The atrium was mildly dilated. - Tricuspid valve: There was trivial regurgitation.   Ct Angio Head W Or Wo Contrast  Result Date: 06/19/2016 CLINICAL DATA:  Left-sided weakness.  Concern for dissection. EXAM: CT ANGIOGRAPHY HEAD AND NECK TECHNIQUE: Multidetector CT imaging of the head and neck was performed using the standard protocol during bolus administration of intravenous contrast. Multiplanar CT image reconstructions and MIPs were obtained to evaluate the vascular anatomy. Carotid stenosis measurements (when applicable) are obtained utilizing NASCET criteria, using the distal internal carotid diameter as the denominator. CONTRAST:  50 mL Isovue 370 IV COMPARISON:  Head CT 06/19/2016 FINDINGS: The examination is degraded by poor contrast bolus. CTA NECK FINDINGS Aortic arch: There is no aneurysm or dissection of the visualized ascending aorta or aortic arch. There is an average right subclavian artery. The  brachiocephalic and left common carotid arteries share a common origin. The visualized proximal subclavian arteries are normal. There is calcific aortic atherosclerosis. Right carotid system: There is no common carotid or internal carotid artery dissection or aneurysm. No hemodynamically significant stenosis. Left carotid system: There is no common carotid or internal carotid artery dissection or aneurysm. Minimal bifurcation atherosclerosis without hemodynamically significant stenosis. Vertebral arteries: The vertebral system is left dominant. Assessment of the vertebral artery origins and proximal V2 segments is severely degraded by poor signal to noise ratio. There is abrupt termination of the left vertebral artery at the junction of the V3 and V4 segments, at the skullbase (sagittal images 166). Skeleton: There is no bony spinal canal stenosis. No lytic or blastic lesions. Other neck: The nasopharynx is clear. The oropharynx and hypopharynx are normal. The epiglottis is normal. The supraglottic larynx, glottis and subglottic larynx are normal. No retropharyngeal collection. The parapharyngeal spaces are preserved. The parotid and submandibular glands are normal. No sialolithiasis or salivary ductal dilatation. The thyroid gland is normal. There is no cervical lymphadenopathy. Upper chest: No pneumothorax or pleural effusion. No nodules or masses. Review of the MIP images confirms the above findings CTA HEAD FINDINGS Anterior circulation: --Intracranial internal carotid arteries: Normal. --Anterior cerebral arteries: Normal. --Middle cerebral arteries: Normal proximally. The poor contrast bolus degrades assessment of the distal circulation. --Posterior communicating arteries: Present on the right. Posterior circulation: --Posterior cerebral arteries: Normal proximally. Poor contrast bolus degrades distal assessment. --Superior cerebellar arteries: Normal on the right. Not clearly visualized on the left. --Basilar  artery: Normal. --Anterior  inferior cerebellar arteries: Normal. --Posterior inferior cerebellar arteries: Normal on the right. Poorly opacified on the left. Venous sinuses: As permitted by contrast timing, patent. Anatomic variants: None Delayed phase: No parenchymal contrast enhancement. Review of the MIP images confirms the above findings IMPRESSION: 1. Examination degraded by poor contrast bolus. 2. 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 This suggests acute dissection, though occlusive thrombus could cause the same appearance. 3. No proximal occlusion of the intracranial arteries. 4. No hemodynamically significant stenosis or dissection of the carotid arteries. These results were called by telephone at the time of interpretation on 06/19/2016 at 3:15 am to Dr. Roland Rack , who verbally acknowledged these results. Electronically Signed   By: Ulyses Jarred M.D.   On: 06/19/2016 03:25   Dg Chest 2 View  Result Date: 06/19/2016 CLINICAL DATA:  SOB,WEAKNESS ON LEFT,HX HTN,CHF,ASTHMA EXAM: CHEST  2 VIEW COMPARISON:  06/13/2015; chest CT - 06/13/2015 FINDINGS: Grossly unchanged enlarged cardiac silhouette and mediastinal contours. Grossly unchanged minimal bibasilar linear heterogeneous opacities, left greater than right, favored to represent atelectasis or scar. Resolution of previously noted heterogeneous opacity with the peripheral aspect the right mid lung. No focal airspace opacities. No definite pleural effusion, though note, the costophrenic angles are excluded on the provided lateral radiograph. No pneumothorax. No evidence of edema. No acute osseus abnormalities. IMPRESSION: Bibasilar atelectasis/scar without superimposed acute cardiopulmonary disease. Electronically Signed   By: Sandi Mariscal M.D.   On: 06/19/2016 09:07   Ct Angio Neck W Or Wo Contrast  Result Date: 06/19/2016 CLINICAL DATA:  Left-sided weakness.  Concern for dissection. EXAM: CT ANGIOGRAPHY HEAD  AND NECK TECHNIQUE: Multidetector CT imaging of the head and neck was performed using the standard protocol during bolus administration of intravenous contrast. Multiplanar CT image reconstructions and MIPs were obtained to evaluate the vascular anatomy. Carotid stenosis measurements (when applicable) are obtained utilizing NASCET criteria, using the distal internal carotid diameter as the denominator. CONTRAST:  50 mL Isovue 370 IV COMPARISON:  Head CT 06/19/2016 FINDINGS: The examination is degraded by poor contrast bolus. CTA NECK FINDINGS Aortic arch: There is no aneurysm or dissection of the visualized ascending aorta or aortic arch. There is an average right subclavian artery. The brachiocephalic and left common carotid arteries share a common origin. The visualized proximal subclavian arteries are normal. There is calcific aortic atherosclerosis. Right carotid system: There is no common carotid or internal carotid artery dissection or aneurysm. No hemodynamically significant stenosis. Left carotid system: There is no common carotid or internal carotid artery dissection or aneurysm. Minimal bifurcation atherosclerosis without hemodynamically significant stenosis. Vertebral arteries: The vertebral system is left dominant. Assessment of the vertebral artery origins and proximal V2 segments is severely degraded by poor signal to noise ratio. There is abrupt termination of the left vertebral artery at the junction of the V3 and V4 segments, at the skullbase (sagittal images 166). Skeleton: There is no bony spinal canal stenosis. No lytic or blastic lesions. Other neck: The nasopharynx is clear. The oropharynx and hypopharynx are normal. The epiglottis is normal. The supraglottic larynx, glottis and subglottic larynx are normal. No retropharyngeal collection. The parapharyngeal spaces are preserved. The parotid and submandibular glands are normal. No sialolithiasis or salivary ductal dilatation. The thyroid gland  is normal. There is no cervical lymphadenopathy. Upper chest: No pneumothorax or pleural effusion. No nodules or masses. Review of the MIP images confirms the above findings CTA HEAD FINDINGS Anterior circulation: --Intracranial internal carotid arteries: Normal. --Anterior  cerebral arteries: Normal. --Middle cerebral arteries: Normal proximally. The poor contrast bolus degrades assessment of the distal circulation. --Posterior communicating arteries: Present on the right. Posterior circulation: --Posterior cerebral arteries: Normal proximally. Poor contrast bolus degrades distal assessment. --Superior cerebellar arteries: Normal on the right. Not clearly visualized on the left. --Basilar artery: Normal. --Anterior inferior cerebellar arteries: Normal. --Posterior inferior cerebellar arteries: Normal on the right. Poorly opacified on the left. Venous sinuses: As permitted by contrast timing, patent. Anatomic variants: None Delayed phase: No parenchymal contrast enhancement. Review of the MIP images confirms the above findings IMPRESSION: 1. Examination degraded by poor contrast bolus. 2. 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 This suggests acute dissection, though occlusive thrombus could cause the same appearance. 3. No proximal occlusion of the intracranial arteries. 4. No hemodynamically significant stenosis or dissection of the carotid arteries. These results were called by telephone at the time of interpretation on 06/19/2016 at 3:15 am to Dr. Roland Rack , who verbally acknowledged these results. Electronically Signed   By: Ulyses Jarred M.D.   On: 06/19/2016 03:25   Mr Brain Wo Contrast  Result Date: 06/19/2016 CLINICAL DATA:  Left retro-orbital headache and sensation of staggering to the left or leaning. EXAM: MRI HEAD WITHOUT CONTRAST TECHNIQUE: Multiplanar, multiecho pulse sequences of the brain and surrounding structures were obtained without intravenous  contrast. COMPARISON:  Head CT 06/19/2016 Brain MRI 12/16/2013 FINDINGS: Brain: There is a subtle focus of reduced diffusion within the left aspect of the medulla (series 5, image 10, series 6, image 15). The finding is duplicated on the coronal diffusion weighted imaging, which argues against the possibility that it is an artifact. The brain parenchymal signal is otherwise normal. No mass lesion or midline shift. No hydrocephalus or extra-axial fluid collection. The midline structures are normal. No age advanced or lobar predominant atrophy. Vascular: There is loss of the normal flow void within knee left vertebral artery. The other major intracranial flow voids are preserved. Remote microhemorrhage in the left pons. Skull and upper cervical spine: The visualized skull base, calvarium, upper cervical spine and extracranial soft tissues are normal. Sinuses/Orbits: No fluid levels or advanced mucosal thickening. No mastoid effusion. Normal orbits. IMPRESSION: 1. Faint focus of reduced diffusion within the left aspect of the medulla, in the region of the gracile fasciculus, concerning for a small acute infarct, particularly in the context of the reported symptoms. 2. Abnormal signal within the V4 segment of the left vertebral artery, compatible with occlusion seen on earlier CTA. 3. Single focus of remote microhemorrhage in the left pons but no acute intracranial hemorrhage. Electronically Signed   By: Ulyses Jarred M.D.   On: 06/19/2016 06:31   Ct Head Code Stroke W/o Cm  Result Date: 06/19/2016 CLINICAL DATA:  Code stroke.  Left-sided weakness EXAM: CT HEAD WITHOUT CONTRAST TECHNIQUE: Contiguous axial images were obtained from the base of the skull through the vertex without intravenous contrast. COMPARISON:  None. FINDINGS: Brain: No mass lesion, intraparenchymal hemorrhage or extra-axial collection. No evidence of acute cortical infarct. Brain parenchyma and CSF-containing spaces are normal for age. Vascular:  No hyperdense vessel or unexpected calcification. Skull: Normal visualized skull base, calvarium and extracranial soft tissues. Sinuses/Orbits: No sinus fluid levels or advanced mucosal thickening. No mastoid effusion. Normal orbits. ASPECTS [facility] Stroke Program Early CT Score) - Ganglionic level infarction (caudate, lentiform nuclei, internal capsule, insula, M1-M3 cortex): 7 - Supraganglionic infarction (M4-M6 cortex): 3 Total score (0-10 with 10 being normal): 10  IMPRESSION: 1. No acute intracranial abnormality. 2. ASPECTS is 10. These results were called by telephone at the time of interpretation on 06/19/2016 at 2:42 am to Dr. Roland Rack, who verbally acknowledged these results. Electronically Signed   By: Ulyses Jarred M.D.   On: 06/19/2016 02:42    Micro Results     No results found for this or any previous visit (from the past 240 hour(s)).  Today   Subjective    [name] today has no headache,no chest abdominal pain,no new weakness tingling or numbness, feels much better    Objective   Blood pressure (!) 144/75, pulse 64, temperature 98.3 F (36.8 C), temperature source Oral, resp. rate 20, weight (S) (!) 162.4 kg (358 lb 0.4 oz), SpO2 98 %.   Intake/Output Summary (Last 24 hours) at 06/21/16 0951 Last data filed at 06/20/16 2301  Gross per 24 hour  Intake             1270 ml  Output             2855 ml  Net            -1585 ml    Exam Awake Alert, Oriented x 3, No new F.N deficits, Normal affect [place].AT,PERRAL Supple Neck,No JVD, No cervical lymphadenopathy appriciated.  Symmetrical Chest wall movement, Good air movement bilaterally, CTAB RRR,No Gallops,Rubs or new Murmurs, No Parasternal Heave +ve B.Sounds, Abd Soft, Non tender, No organomegaly appriciated, No rebound -guarding or rigidity. No Cyanosis, Clubbing or edema, No new Rash or bruise   Data Review   CBC w Diff:  Lab Results  Component Value Date   WBC 9.3 06/21/2016   HGB 14.4  06/21/2016   HCT 45.1 06/21/2016   PLT 160 06/21/2016   LYMPHOPCT 26 06/19/2016   MONOPCT 11 06/19/2016   EOSPCT 2 06/19/2016   BASOPCT 1 06/19/2016    CMP:  Lab Results  Component Value Date   NA 138 06/21/2016   K 3.7 06/21/2016   CL 101 06/21/2016   CO2 27 06/21/2016   BUN 16 06/21/2016   CREATININE 1.05 06/21/2016   CREATININE 1.12 12/05/2012   PROT 5.9 (L) 06/19/2016   ALBUMIN 3.3 (L) 06/19/2016   BILITOT 0.9 06/19/2016   ALKPHOS 86 06/19/2016   AST 33 06/19/2016   ALT 21 06/19/2016  .   Total Time in preparing paper work, data evaluation and todays exam - 35 minutes  Thurnell Lose M.D on 06/21/2016 at 9:51 AM  [facility]   Office  [phone]

## 2016-06-22 ENCOUNTER — Inpatient Hospital Stay (HOSPITAL_COMMUNITY): Payer: Medicare Other | Admitting: Speech Pathology

## 2016-06-22 ENCOUNTER — Telehealth: Payer: Self-pay | Admitting: *Deleted

## 2016-06-22 ENCOUNTER — Inpatient Hospital Stay (HOSPITAL_COMMUNITY): Payer: Self-pay | Admitting: Physical Therapy

## 2016-06-22 ENCOUNTER — Inpatient Hospital Stay (HOSPITAL_COMMUNITY): Payer: Self-pay | Admitting: Occupational Therapy

## 2016-06-22 DIAGNOSIS — I63112 Cerebral infarction due to embolism of left vertebral artery: Secondary | ICD-10-CM

## 2016-06-22 LAB — CBC WITH DIFFERENTIAL/PLATELET
BASOS ABS: 0 10*3/uL (ref 0.0–0.1)
Basophils Relative: 0 %
EOS ABS: 0.1 10*3/uL (ref 0.0–0.7)
EOS PCT: 1 %
HCT: 46.7 % (ref 39.0–52.0)
Hemoglobin: 15.2 g/dL (ref 13.0–17.0)
Lymphocytes Relative: 12 %
Lymphs Abs: 1.5 10*3/uL (ref 0.7–4.0)
MCH: 29 pg (ref 26.0–34.0)
MCHC: 32.5 g/dL (ref 30.0–36.0)
MCV: 89.1 fL (ref 78.0–100.0)
Monocytes Absolute: 1.3 10*3/uL — ABNORMAL HIGH (ref 0.1–1.0)
Monocytes Relative: 11 %
NEUTROS PCT: 76 %
Neutro Abs: 9.7 10*3/uL — ABNORMAL HIGH (ref 1.7–7.7)
PLATELETS: 183 10*3/uL (ref 150–400)
RBC: 5.24 MIL/uL (ref 4.22–5.81)
RDW: 14.2 % (ref 11.5–15.5)
WBC: 12.7 10*3/uL — AB (ref 4.0–10.5)

## 2016-06-22 LAB — COMPREHENSIVE METABOLIC PANEL
ALBUMIN: 3.4 g/dL — AB (ref 3.5–5.0)
ALT: 19 U/L (ref 17–63)
AST: 26 U/L (ref 15–41)
Alkaline Phosphatase: 85 U/L (ref 38–126)
Anion gap: 16 — ABNORMAL HIGH (ref 5–15)
BUN: 21 mg/dL — AB (ref 6–20)
CHLORIDE: 100 mmol/L — AB (ref 101–111)
CO2: 25 mmol/L (ref 22–32)
Calcium: 9 mg/dL (ref 8.9–10.3)
Creatinine, Ser: 1.1 mg/dL (ref 0.61–1.24)
GFR calc Af Amer: >60 mL/min (ref >60)
GFR calc non Af Amer: >60 mL/min (ref >60)
GLUCOSE: 102 mg/dL — AB (ref 65–99)
Potassium: 3.4 mmol/L — ABNORMAL LOW (ref 3.5–5.1)
SODIUM: 141 mmol/L (ref 135–145)
Total Bilirubin: 1.2 mg/dL (ref 0.3–1.2)
Total Protein: 6.2 g/dL — ABNORMAL LOW (ref 6.5–8.1)

## 2016-06-22 MED ORDER — HYPROMELLOSE (GONIOSCOPIC) 2.5 % OP SOLN
1.0000 [drp] | Freq: Three times a day (TID) | OPHTHALMIC | Status: DC | PRN
  Filled 2016-06-22: qty 15

## 2016-06-22 MED ORDER — POTASSIUM CHLORIDE CRYS ER 20 MEQ PO TBCR
20.0000 meq | EXTENDED_RELEASE_TABLET | Freq: Every day | ORAL | Status: DC
  Administered 2016-06-22 – 2016-06-25 (×4): 20 meq via ORAL
  Filled 2016-06-22 (×4): qty 1

## 2016-06-22 MED ORDER — ACETAMINOPHEN 325 MG PO TABS
650.0000 mg | ORAL_TABLET | ORAL | Status: DC | PRN
  Administered 2016-06-22 – 2016-06-26 (×5): 650 mg via ORAL
  Filled 2016-06-22 (×6): qty 2

## 2016-06-22 NOTE — Discharge Instructions (Addendum)
Inpatient Rehab Discharge Instructions  [name] Discharge date and time: No discharge date for patient encounter.   Activities/Precautions/ Functional Status: Activity: activity as tolerated Diet: regular diet Wound Care: none needed Functional status:  ___ No restrictions     ___ Walk up steps independently ___ 24/7 supervision/assistance   ___ Walk up steps with assistance ___ Intermittent supervision/assistance  ___ Bathe/dress independently ___ Walk with walker     _x__ Bathe/dress with assistance ___ Walk Independently    ___ Shower independently ___ Walk with assistance    ___ Shower with assistance ___ No alcohol     ___ Return to work/school ________  COMMUNITY REFERRALS UPON DISCHARGE:   Outpatient: PT     OT  Agency:  [facility] Phone:  [phone]  Appointment Date/Time:  Thursday, April 12th at 2:00 PM (PT) and 2:45 PM (OT) Medical Equipment/Items Ordered:  22"x18" lightweight wheelchair with tension back and basic cushion; heavy duty rolling walker; drop arm commode; tub transfer bench  Agency/Supplier:  [facility]           Phone:  [phone]  GENERAL COMMUNITY RESOURCES FOR PATIENT/FAMILY: Support Groups:  [facility] Stroke Support Group                              Meets the second Thursday of each month from 3-4PM, except June, July, and August                              In the dayroom of [facility] at [facility]                              For more information, call Dwyane Dee at [phone]  Special Instructions:  no driving [place] Cigarette smoking nearly doubles your risk of having a stroke & is the single most alterable risk factor  If you smoke or have smoked in the last 12 months, you are advised to quit smoking for your health.  Most of the excess cardiovascular risk related to smoking disappears within a  year of stopping.  Ask you doctor about anti-smoking medications  [place] Quit Line: 1-800-QUIT NOW  Free Smoking Cessation Classes [phone]  CHOLESTEROL Know your levels; limit fat & cholesterol in your diet  Lipid Panel     Component Value Date/Time   CHOL 124 06/19/2016 0730   TRIG 74 06/19/2016 0730   HDL 32 (L) 06/19/2016 0730   CHOLHDL 3.9 06/19/2016 0730   VLDL 15 06/19/2016 0730   LDLCALC 77 06/19/2016 0730      Many patients benefit from treatment even if their cholesterol is at goal.  Goal: Total Cholesterol (CHOL) less than 160  Goal:  Triglycerides (TRIG) less than 150  Goal:  HDL greater than 40  Goal:  LDL (LDLCALC) less than 100   BLOOD PRESSURE American Stroke Association blood pressure target is less that 120/80 mm/Hg  Your discharge blood pressure is:  BP: (!) 121/58  Monitor your blood pressure  Limit your salt and alcohol intake  Many individuals will require more than one medication for high blood pressure  DIABETES (A1c is a blood sugar average for last 3 months) Goal HGBA1c is under 7% (HBGA1c is blood sugar average for last 3 months)  Diabetes: No known diagnosis of diabetes    Lab Results  Component Value Date   HGBA1C 5.8 (H) 06/19/2016     Your HGBA1c can be lowered with medications, healthy diet, and exercise.  Check your blood sugar as directed by your physician  Call your physician if you experience unexplained or low blood sugars.  PHYSICAL ACTIVITY/REHABILITATION Goal is 30 minutes at least 4 days per week  Activity: Increase activity slowly, Therapies: Physical Therapy: Home Health Return to work:   Activity decreases your risk of heart attack and stroke and makes your heart stronger.  It helps control your weight and blood pressure; helps you relax and can improve your mood.  Participate in a regular exercise program.  Talk with your doctor about the best form of exercise for you (dancing, walking, swimming, cycling).    DIET/WEIGHT Goal is to maintain a healthy weight  Your discharge diet is: Diet regular Room service appropriate? Yes; Fluid consistency: Thin  liquids Your height is:  Height: 6' (182.9 cm) Your current weight is: Weight: (!) 155.2 kg (342 lb 2.5 oz) Your Body Mass Index (BMI) is:  BMI (Calculated): 45.4  Following the type of diet specifically designed for you will help prevent another stroke.  Your goal weight range is:    Your goal Body Mass Index (BMI) is 19-24.  Healthy food habits can help reduce 3 risk factors for stroke:  High cholesterol, hypertension, and excess weight.  RESOURCES Stroke/Support Group:  Call [phone]   STROKE EDUCATION PROVIDED/REVIEWED AND GIVEN TO PATIENT Stroke warning signs and symptoms How to activate emergency medical system (call 911). Medications prescribed at discharge. Need for follow-up after discharge. Personal risk factors for stroke. Pneumonia vaccine given:  Flu vaccine given:  My questions have been answered, the writing is legible, and I understand these instructions.  I will adhere to these goals & educational materials that have been provided to me after my discharge from the hospital.      My questions have been answered and I understand these instructions. I will adhere to these goals and the provided educational materials after my discharge from the hospital.  Patient/Caregiver Signature _______________________________ Date __________  Clinician Signature _______________________________________ Date __________  Please bring this form and your medication list with you to all your follow-up doctor's appointments. Information on my medicine - ELIQUIS (apixaban)  This medication education was reviewed with me or my healthcare representative as part of my discharge preparation.    Why was Eliquis prescribed for you? Eliquis was prescribed for you to reduce the risk of a blood clot forming that can cause a stroke if you have a  medical condition called atrial fibrillation (a type of irregular heartbeat).  What do You need to know about Eliquis ? Take your Eliquis 5 mg TWICE DAILY - one tablet in the morning and one tablet in the evening with or without food. If you have difficulty swallowing the tablet whole please discuss with your pharmacist how to take the medication safely.  Take Eliquis exactly as prescribed by your doctor and DO NOT stop taking Eliquis without talking to the doctor who prescribed the medication.  Stopping may increase your risk of developing a stroke.  Refill your prescription before you run out.  After discharge, you should have regular check-up appointments with your healthcare provider that is prescribing your Eliquis.  In the future your dose may need to be changed if your kidney function or weight changes by a significant amount or  as you get older.  What do you do if you miss a dose? If you miss a dose, take it as soon as you remember on the same day and resume taking twice daily.  Do not take more than one dose of ELIQUIS at the same time to make up a missed dose.  Important Safety Information A possible side effect of Eliquis is bleeding. You should call your healthcare provider right away if you experience any of the following: ? Bleeding from an injury or your nose that does not stop. ? Unusual colored urine (red or dark brown) or unusual colored stools (red or black). ? Unusual bruising for unknown reasons. ? A serious fall or if you hit your head (even if there is no bleeding).  Some medicines may interact with Eliquis and might increase your risk of bleeding or clotting while on Eliquis. To help avoid this, consult your healthcare provider or pharmacist prior to using any new prescription or non-prescription medications, including herbals, vitamins, non-steroidal anti-inflammatory drugs (NSAIDs) and supplements.  This website has more information on Eliquis (apixaban):  [email or web address]

## 2016-06-22 NOTE — Progress Notes (Signed)
[name] Lorie Phenix, MD Physician Signed Physical Medicine and Rehabilitation  Consult Note Date of Service: 06/20/2016 6:18 AM  Related encounter: ED to Hosp-Admission (Discharged) from 06/19/2016 in [place] All Collapse All   [] Hide copied text [] Hover for attribution information      Physical Medicine and Rehabilitation Consult Reason for Consult: Left medullary infarct Referring Physician: [facility]   HPI: [name] is a 71 y.o. right handed male with history of atrial fibrillation on pulmonary emboli maintained on Eliquis as well as low-dose aspirin, hypertension, history of pericardial effusion s/p window, OSA on CPAP, diastolic congestive heart failure, bilateral TKA, morbid obesity with gastric banding procedure. Per chart review, patient, and family, patient lives with spouse. Independent prior to admission using a cane and walker at times due to history of knee replacement. One level home with 7 steps to entry. Wife with limited physical assist with history of CABG. Son in the area works. Presented 06/19/2016 with headache of sudden onset while watching television and lean to the left side as well as slurred speech. Cranial CT scan negative. Patient did not receive TPA. 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 No proximal occlusion of the intracranial arteries. No significant stenosis or dissection of the carotid arteries. MRI reviewed, showing small left medullary infarct. Echocardiogram with ejection fraction 93% grade 1 diastolic dysfunction. Neurology services consult infarct felt to be embolic secondary to known atrial fibrillation and transition from Eliquis to heparin ggt as well as aspirin. Modified barium swallow planned for 06/20/2016. Physical therapy evaluation completed with recommendations of physical medicine rehabilitation consult.   Review of Systems    Constitutional: Negative for chills and fever.  HENT: Negative for hearing loss and tinnitus.   Eyes: Positive for double vision. Negative for blurred vision.  Respiratory: Positive for shortness of breath. Negative for cough.   Cardiovascular: Positive for leg swelling. Negative for chest pain and palpitations.  Gastrointestinal: Positive for constipation and nausea. Negative for vomiting.  Genitourinary: Positive for urgency. Negative for dysuria and hematuria.  Musculoskeletal: Positive for joint pain and myalgias.  Skin: Negative for rash.  Neurological: Positive for dizziness, sensory change, speech change, weakness and headaches. Negative for seizures.  Psychiatric/Behavioral: Positive for depression.  All other systems reviewed and are negative.  Past Medical History:  Diagnosis Date  . Anemia 07/29/2013  . Asthma    childhood asthma  . Benign prostatic hypertrophy    several prostate biopsies neg for cancer.   . Bilateral hip pain   . Cancer ([place])   . CHF (congestive heart failure) ([place])   . Complication of anesthesia    MORPHINE CAUSES HALLUCINATIONS  . Depression   . FH: colonic polyps 2010  . Gout   . History of kidney stones   . Hypertension   . Kidney cysts   . Morbid obesity ([place])   . Numbness    feet / legs  . PAF (paroxysmal atrial fibrillation) with RVR 07/29/2013  . Sleep apnea    USES C PAP        Past Surgical History:  Procedure Laterality Date  . ARTHROSCOPY KNEE W/ DRILLING    . BREATH TEK H PYLORI N/A 12/23/2012   Procedure: BREATH TEK H PYLORI;  Surgeon: Gayland Curry, MD;  Location: [facility];  Service: General;  Laterality: N/A;  . CHOLECYSTECTOMY  1989  . COLONOSCOPY N/A 07/31/2013  Procedure: COLONOSCOPY;  Surgeon: Gatha Mayer, MD;  Location: [facility];  Service: Endoscopy;  Laterality: N/A;  . ESOPHAGOGASTRODUODENOSCOPY N/A 07/22/2013   Procedure: ESOPHAGOGASTRODUODENOSCOPY (EGD);  Surgeon: Irene Shipper,  MD;  Location[facility] ENDOSCOPY;  Service: Endoscopy;  Laterality: N/A;  . ESOPHAGOGASTRODUODENOSCOPY N/A 07/31/2013   Procedure: ESOPHAGOGASTRODUODENOSCOPY (EGD);  Surgeon: Gatha Mayer, MD;  Location: [facility] ENDOSCOPY;  Service: Endoscopy;  Laterality: N/A;  . INTRAOPERATIVE TRANSESOPHAGEAL ECHOCARDIOGRAM N/A 07/18/2013   Procedure: INTRAOPERATIVE TRANSESOPHAGEAL ECHOCARDIOGRAM;  Surgeon: Grace Isaac, MD;  Location: [place];  Service: Open Heart Surgery;  Laterality: N/A;  . LAPAROSCOPIC GASTRIC BANDING WITH HIATAL HERNIA REPAIR N/A 04/08/2013   Procedure: LAPAROSCOPIC GASTRIC BANDING WITH possible  HIATAL HERNIA REPAIR;  Surgeon: Gayland Curry, MD;  Location: [facility];  Service: General;  Laterality: N/A;  . LITHOTRIPSY    . renal calculi    . SUBXYPHOID PERICARDIAL WINDOW N/A 07/18/2013   Procedure: SUBXYPHOID PERICARDIAL WINDOW;  Surgeon: Grace Isaac, MD;  Location: [place];  Service: Thoracic;  Laterality: N/A;  . TONSILLECTOMY    . total knee replacment     bilat  . trigger finger repair     also lue, cts surgically repaired  . WISDOM TOOTH EXTRACTION           Family History  Problem Relation Age of Onset  . Depression Mother   . Hypertension Mother   . Heart disease Father     MI  . Depression Brother   . Stroke Paternal Aunt     CVA  . Diabetes Paternal Uncle   . Heart disease Paternal Uncle     MI  . Heart disease Paternal Grandmother     MI  . Diabetes Cousin     PATERNAL   Social History:  reports that he quit smoking about 41 years ago. His smoking use included Cigarettes. He started smoking about 55 years ago. He has a 2.60 pack-year smoking history. He has never used smokeless tobacco. He reports that he drinks alcohol. He reports that he does not use drugs. Allergies:       Allergies  Allergen Reactions  . Morphine Other (See Comments)    Nausea and Severe hallucinations  . Codeine Nausea Only  . Penicillins Other (See  Comments)    LOC with shot  . Propoxyphene N-Acetaminophen Nausea Only         Medications Prior to Admission  Medication Sig Dispense Refill  . amLODipine-benazepril (LOTREL) 5-10 MG capsule Take 1 capsule by mouth daily. 30 capsule 5  . apixaban (ELIQUIS) 5 MG TABS tablet Take 1 tablet (5 mg total) by mouth 2 (two) times daily. 180 tablet 3  . aspirin 81 MG tablet Take 81 mg by mouth 2 (two) times daily.     [name] atorvastatin (LIPITOR) 40 MG tablet Take 1 tablet (40 mg total) by mouth daily. 90 tablet 0  . budesonide-formoterol (SYMBICORT) 160-4.5 MCG/ACT inhaler Inhale 2 puffs into the lungs 2 (two) times daily as needed (shortness of breath).    . CALCIUM PO Take 500 mg by mouth 3 (three) times daily.     . Cyanocobalamin (VITAMIN B-12) 1000 MCG SUBL Place 1,000 mcg under the tongue every evening.     [name] Sodium (DSS) 100 MG CAPS Take 100 mg by mouth every other day.    . finasteride (PROSCAR) 5 MG tablet Take 5 mg by mouth daily.     . flecainide (TAMBOCOR) 50 MG tablet TAKE  ONE TABLET BY MOUTH EVERY 12 HOURS 180 tablet 2  . furosemide (LASIX) 20 MG tablet TAKE 1 TAB ONCE A DAY AS NEEDED FOR SWELLING IN THE LEGS (Patient taking differently: TAKE 1 TAB ONCE A DAY FOR SWELLING IN THE LEGS) 90 tablet 1  . loratadine (CLARITIN) 10 MG tablet Take 10 mg by mouth daily.    [name] LORazepam (ATIVAN) 0.5 MG tablet TAKE ONE TABLET BY MOUTH ONCE DAILY IN THE MORNING AND TWO AT BEDTIME 90 tablet 2  . montelukast (SINGULAIR) 10 MG tablet TAKE 1 TABLET BY MOUTH ONCE A DAY AT BEDTIME 30 tablet 1  . naphazoline-pheniramine (NAPHCON-A) 0.025-0.3 % ophthalmic solution Place 1 drop into both eyes 4 (four) times daily as needed for irritation.    . pantoprazole (PROTONIX) 40 MG tablet Take 1 tablet (40 mg total) by mouth 2 (two) times daily. 180 tablet 0  . risperiDONE (RISPERDAL) 0.5 MG tablet Take 1 tablet (0.5 mg total) by mouth at bedtime. 30 tablet 2  . rOPINIRole (REQUIP) 0.25 MG  tablet TAKE THREE TABLETS BY MOUTH AT BEDTIME OR  AS  DIRECTED  FOR  LEG  JERKS 90 tablet 11  . sertraline (ZOLOFT) 100 MG tablet TAKE ONE AND ONE-HALF TABLETS BY MOUTH ONCE DAILY WITH  BREAKFAST 45 tablet 2    Home: Home Living Family/patient expects to be discharged to:: Private residence Living Arrangements: Spouse/significant other Available Help at Discharge: Family Type of Home: House Home Access: Stairs to enter Technical brewer of Steps: 7 Entrance Stairs-Rails: Left, Right Home Layout: One level Bathroom Shower/Tub: Chiropodist: Standard Home Equipment: Environmental consultant - 2 wheels, [employer] - quad  Functional History: Prior Function Level of Independence: Independent Functional Status:  Mobility: Bed Mobility Overal bed mobility: Needs Assistance Bed Mobility: Supine to Sit, Sit to Supine Supine to sit: Min assist Sit to supine: Mod assist General bed mobility comments: pt requires incr time and effort to exit bed on the R side. pt needed help with power up from supine. pt requires (A) for BIL LE and to sequence return to supine Transfers Overall transfer level: Needs assistance Equipment used: 2 person hand held assist Transfers: Sit to/from Stand Sit to Stand: +2 physical assistance, Mod assist General transfer comment: pt requires (A) once in standing due to immediate lob to the L side. pt requires total +2 mod to max in static standing. pt with chair positioned for BIL UE to help with center while in static standing Ambulation/Gait Ambulation/Gait assistance: Mod assist, +2 physical assistance Ambulation Distance (Feet):  (3 side steps to [facility]) Assistive device: 2 person hand held assist General Gait Details: limited to side stepping to HOB, pt with strong lean to the L, dificulty with raising L LE and impaired proprioception Gait velocity: slow Gait velocity interpretation: Below normal speed for age/gender  ADL: ADL Overall ADL's : Needs  assistance/impaired Eating/Feeding Details (indicate cue type and reason): pt reports change in voice quality and question need for SLP Grooming: Wash/dry hands, Min guard Upper Body Bathing: Minimal assistance Lower Body Bathing: Maximal assistance Lower Body Bathing Details (indicate cue type and reason): pt requires (A) for balance due to L lean General ADL Comments: pt static sitting with strong L lean. pt requires (A) for upright posture. Pt states I can't hold it there  Cognition: Cognition Overall Cognitive Status: Within Functional Limits for tasks assessed Orientation Level: Oriented X4 Cognition Arousal/Alertness: Awake/alert Behavior During Therapy: WFL for tasks assessed/performed Overall Cognitive Status: Within Functional Limits  for tasks assessed  Blood pressure 133/69, pulse (!) 59, temperature 98.4 F (36.9 C), temperature source Oral, resp. rate 20, weight (S) (!) 162.4 kg (358 lb 0.4 oz), SpO2 98 %. Physical Exam  Vitals reviewed. Constitutional: He is oriented to person, place, and time. He appears well-developed.  71 year old right-handed obese white male  HENT:  Head: Normocephalic and atraumatic.  Eyes: Conjunctivae and EOM are normal.  Neck: Normal range of motion. Neck supple. No thyromegaly present.  Cardiovascular: Normal rate and regular rhythm.   Respiratory: Effort normal and breath sounds normal.  +[place]  GI: Soft. Bowel sounds are normal. He exhibits no distension.  Musculoskeletal: He exhibits edema and tenderness.  Neurological: He is alert and oriented to person, place, and time.  Speech is a bit dysarthric but fully intelligible.  Awareness of deficits.  Follows full commands Sensation intact to light touch DTRs symmetric Motor: RUE/RLE 5/5 LUE/LLE: 4+/5 with mild ataxia  Skin: Skin is warm and dry.  Psychiatric: He has a normal mood and affect. His behavior is normal.    Lab Results Last 24 Hours       Results for orders placed or  performed during the hospital encounter of 06/19/16 (from the past 24 hour(s))  Lipid panel     Status: Abnormal   Collection Time: 06/19/16  7:30 AM  Result Value Ref Range   Cholesterol 124 0 - 200 mg/dL   Triglycerides 74 <150 mg/dL   HDL 32 (L) >40 mg/dL   Total CHOL/HDL Ratio 3.9 RATIO   VLDL 15 0 - 40 mg/dL   LDL Cholesterol 77 0 - 99 mg/dL  Hemoglobin A1c     Status: Abnormal   Collection Time: 06/19/16  7:32 AM  Result Value Ref Range   Hgb A1c MFr Bld 5.8 (H) 4.8 - 5.6 %   Mean Plasma Glucose 120 mg/dL      Imaging Results (Last 48 hours)  Ct Angio Head W Or Wo Contrast  Result Date: 06/19/2016 CLINICAL DATA:  Left-sided weakness.  Concern for dissection. EXAM: CT ANGIOGRAPHY HEAD AND NECK TECHNIQUE: Multidetector CT imaging of the head and neck was performed using the standard protocol during bolus administration of intravenous contrast. Multiplanar CT image reconstructions and MIPs were obtained to evaluate the vascular anatomy. Carotid stenosis measurements (when applicable) are obtained utilizing NASCET criteria, using the distal internal carotid diameter as the denominator. CONTRAST:  50 mL Isovue 370 IV COMPARISON:  Head CT 06/19/2016 FINDINGS: The examination is degraded by poor contrast bolus. CTA NECK FINDINGS Aortic arch: There is no aneurysm or dissection of the visualized ascending aorta or aortic arch. There is an average right subclavian artery. The brachiocephalic and left common carotid arteries share a common origin. The visualized proximal subclavian arteries are normal. There is calcific aortic atherosclerosis. Right carotid system: There is no common carotid or internal carotid artery dissection or aneurysm. No hemodynamically significant stenosis. Left carotid system: There is no common carotid or internal carotid artery dissection or aneurysm. Minimal bifurcation atherosclerosis without hemodynamically significant stenosis. Vertebral arteries: The  vertebral system is left dominant. Assessment of the vertebral artery origins and proximal V2 segments is severely degraded by poor signal to noise ratio. There is abrupt termination of the left vertebral artery at the junction of the V3 and V4 segments, at the skullbase (sagittal images 166). Skeleton: There is no bony spinal canal stenosis. No lytic or blastic lesions. Other neck: The nasopharynx is clear. The oropharynx and hypopharynx are normal. The  epiglottis is normal. The supraglottic larynx, glottis and subglottic larynx are normal. No retropharyngeal collection. The parapharyngeal spaces are preserved. The parotid and submandibular glands are normal. No sialolithiasis or salivary ductal dilatation. The thyroid gland is normal. There is no cervical lymphadenopathy. Upper chest: No pneumothorax or pleural effusion. No nodules or masses. Review of the MIP images confirms the above findings CTA HEAD FINDINGS Anterior circulation: --Intracranial internal carotid arteries: Normal. --Anterior cerebral arteries: Normal. --Middle cerebral arteries: Normal proximally. The poor contrast bolus degrades assessment of the distal circulation. --Posterior communicating arteries: Present on the right. Posterior circulation: --Posterior cerebral arteries: Normal proximally. Poor contrast bolus degrades distal assessment. --Superior cerebellar arteries: Normal on the right. Not clearly visualized on the left. --Basilar artery: Normal. --Anterior inferior cerebellar arteries: Normal. --Posterior inferior cerebellar arteries: Normal on the right. Poorly opacified on the left. Venous sinuses: As permitted by contrast timing, patent. Anatomic variants: None Delayed phase: No parenchymal contrast enhancement. Review of the MIP images confirms the above findings IMPRESSION: 1. Examination degraded by poor contrast bolus. 2. 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 This suggests acute dissection,  though occlusive thrombus could cause the same appearance. 3. No proximal occlusion of the intracranial arteries. 4. No hemodynamically significant stenosis or dissection of the carotid arteries. These results were called by telephone at the time of interpretation on 06/19/2016 at 3:15 am to Dr. Roland Rack , who verbally acknowledged these results. Electronically Signed   By: Ulyses Jarred M.D.   On: 06/19/2016 03:25   Dg Chest 2 View  Result Date: 06/19/2016 CLINICAL DATA:  SOB,WEAKNESS ON LEFT,HX HTN,CHF,ASTHMA EXAM: CHEST  2 VIEW COMPARISON:  06/13/2015; chest CT - 06/13/2015 FINDINGS: Grossly unchanged enlarged cardiac silhouette and mediastinal contours. Grossly unchanged minimal bibasilar linear heterogeneous opacities, left greater than right, favored to represent atelectasis or scar. Resolution of previously noted heterogeneous opacity with the peripheral aspect the right mid lung. No focal airspace opacities. No definite pleural effusion, though note, the costophrenic angles are excluded on the provided lateral radiograph. No pneumothorax. No evidence of edema. No acute osseus abnormalities. IMPRESSION: Bibasilar atelectasis/scar without superimposed acute cardiopulmonary disease. Electronically Signed   By: Sandi Mariscal M.D.   On: 06/19/2016 09:07   Ct Angio Neck W Or Wo Contrast  Result Date: 06/19/2016 CLINICAL DATA:  Left-sided weakness.  Concern for dissection. EXAM: CT ANGIOGRAPHY HEAD AND NECK TECHNIQUE: Multidetector CT imaging of the head and neck was performed using the standard protocol during bolus administration of intravenous contrast. Multiplanar CT image reconstructions and MIPs were obtained to evaluate the vascular anatomy. Carotid stenosis measurements (when applicable) are obtained utilizing NASCET criteria, using the distal internal carotid diameter as the denominator. CONTRAST:  50 mL Isovue 370 IV COMPARISON:  Head CT 06/19/2016 FINDINGS: The examination is degraded  by poor contrast bolus. CTA NECK FINDINGS Aortic arch: There is no aneurysm or dissection of the visualized ascending aorta or aortic arch. There is an average right subclavian artery. The brachiocephalic and left common carotid arteries share a common origin. The visualized proximal subclavian arteries are normal. There is calcific aortic atherosclerosis. Right carotid system: There is no common carotid or internal carotid artery dissection or aneurysm. No hemodynamically significant stenosis. Left carotid system: There is no common carotid or internal carotid artery dissection or aneurysm. Minimal bifurcation atherosclerosis without hemodynamically significant stenosis. Vertebral arteries: The vertebral system is left dominant. Assessment of the vertebral artery origins and proximal V2 segments is severely degraded by  poor signal to noise ratio. There is abrupt termination of the left vertebral artery at the junction of the V3 and V4 segments, at the skullbase (sagittal images 166). Skeleton: There is no bony spinal canal stenosis. No lytic or blastic lesions. Other neck: The nasopharynx is clear. The oropharynx and hypopharynx are normal. The epiglottis is normal. The supraglottic larynx, glottis and subglottic larynx are normal. No retropharyngeal collection. The parapharyngeal spaces are preserved. The parotid and submandibular glands are normal. No sialolithiasis or salivary ductal dilatation. The thyroid gland is normal. There is no cervical lymphadenopathy. Upper chest: No pneumothorax or pleural effusion. No nodules or masses. Review of the MIP images confirms the above findings CTA HEAD FINDINGS Anterior circulation: --Intracranial internal carotid arteries: Normal. --Anterior cerebral arteries: Normal. --Middle cerebral arteries: Normal proximally. The poor contrast bolus degrades assessment of the distal circulation. --Posterior communicating arteries: Present on the right. Posterior circulation:  --Posterior cerebral arteries: Normal proximally. Poor contrast bolus degrades distal assessment. --Superior cerebellar arteries: Normal on the right. Not clearly visualized on the left. --Basilar artery: Normal. --Anterior inferior cerebellar arteries: Normal. --Posterior inferior cerebellar arteries: Normal on the right. Poorly opacified on the left. Venous sinuses: As permitted by contrast timing, patent. Anatomic variants: None Delayed phase: No parenchymal contrast enhancement. Review of the MIP images confirms the above findings IMPRESSION: 1. Examination degraded by poor contrast bolus. 2. 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 This suggests acute dissection, though occlusive thrombus could cause the same appearance. 3. No proximal occlusion of the intracranial arteries. 4. No hemodynamically significant stenosis or dissection of the carotid arteries. These results were called by telephone at the time of interpretation on 06/19/2016 at 3:15 am to Dr. Roland Rack , who verbally acknowledged these results. Electronically Signed   By: Ulyses Jarred M.D.   On: 06/19/2016 03:25   Mr Brain Wo Contrast  Result Date: 06/19/2016 CLINICAL DATA:  Left retro-orbital headache and sensation of staggering to the left or leaning. EXAM: MRI HEAD WITHOUT CONTRAST TECHNIQUE: Multiplanar, multiecho pulse sequences of the brain and surrounding structures were obtained without intravenous contrast. COMPARISON:  Head CT 06/19/2016 Brain MRI 12/16/2013 FINDINGS: Brain: There is a subtle focus of reduced diffusion within the left aspect of the medulla (series 5, image 10, series 6, image 15). The finding is duplicated on the coronal diffusion weighted imaging, which argues against the possibility that it is an artifact. The brain parenchymal signal is otherwise normal. No mass lesion or midline shift. No hydrocephalus or extra-axial fluid collection. The midline structures are normal. No  age advanced or lobar predominant atrophy. Vascular: There is loss of the normal flow void within knee left vertebral artery. The other major intracranial flow voids are preserved. Remote microhemorrhage in the left pons. Skull and upper cervical spine: The visualized skull base, calvarium, upper cervical spine and extracranial soft tissues are normal. Sinuses/Orbits: No fluid levels or advanced mucosal thickening. No mastoid effusion. Normal orbits. IMPRESSION: 1. Faint focus of reduced diffusion within the left aspect of the medulla, in the region of the gracile fasciculus, concerning for a small acute infarct, particularly in the context of the reported symptoms. 2. Abnormal signal within the V4 segment of the left vertebral artery, compatible with occlusion seen on earlier CTA. 3. Single focus of remote microhemorrhage in the left pons but no acute intracranial hemorrhage. Electronically Signed   By: Ulyses Jarred M.D.   On: 06/19/2016 06:31   Ct Head Code Stroke W/o  Cm  Result Date: 06/19/2016 CLINICAL DATA:  Code stroke.  Left-sided weakness EXAM: CT HEAD WITHOUT CONTRAST TECHNIQUE: Contiguous axial images were obtained from the base of the skull through the vertex without intravenous contrast. COMPARISON:  None. FINDINGS: Brain: No mass lesion, intraparenchymal hemorrhage or extra-axial collection. No evidence of acute cortical infarct. Brain parenchyma and CSF-containing spaces are normal for age. Vascular: No hyperdense vessel or unexpected calcification. Skull: Normal visualized skull base, calvarium and extracranial soft tissues. Sinuses/Orbits: No sinus fluid levels or advanced mucosal thickening. No mastoid effusion. Normal orbits. ASPECTS [facility] Stroke Program Early CT Score) - Ganglionic level infarction (caudate, lentiform nuclei, internal capsule, insula, M1-M3 cortex): 7 - Supraganglionic infarction (M4-M6 cortex): 3 Total score (0-10 with 10 being normal): 10 IMPRESSION: 1. No acute  intracranial abnormality. 2. ASPECTS is 10. These results were called by telephone at the time of interpretation on 06/19/2016 at 2:42 am to Dr. Roland Rack, who verbally acknowledged these results. Electronically Signed   By: Ulyses Jarred M.D.   On: 06/19/2016 02:42     Assessment/Plan: Diagnosis: Left medullary infarct Labs and images independently reviewed.  Records reviewed and summated above. Stroke: Continue secondary stroke prophylaxis and Risk Factor Modification listed below:   Antiplatelet therapy:   Blood Pressure Management:  Continue current medication with prn's with permisive HTN per primary team Statin Agent:   Prediabetes management:   Left sided hemiparesis  1. Does the need for close, 24 hr/day medical supervision in concert with the patient's rehab needs make it unreasonable for this patient to be served in a less intensive setting? Yes 2. Co-Morbidities requiring supervision/potential complications: atrial fibrillation (cont meds, monitor with increased activity, transition from heparin ggt when appropriate), pulmonary emboli (see previous), HTN (monitor and provide prns in accordance with increased physical exertion and pain), history of pericardial effusion s/p window, OSA (cont CPAP, monitor for daytime somnolence), diastolic congestive heart failure (monitor for signs/symptoms of fluid overload), bilateral TKA, morbid obesity (Body mass index is 48.56 kg/m., diet and exercise education, encourage weight loss to increase endurance and promote overall health), dysphagia (advance diet as tolerated), hypokalemia (continue to monitor and replete as necessary) 3. Due to safety, disease management and patient education, does the patient require 24 hr/day rehab nursing? Yes 4. Does the patient require coordinated care of a physician, rehab nurse, PT (1-2 hrs/day, 5 days/week), OT (1-2 hrs/day, 5 days/week) and SLP (1-2 hrs/day, 5 days/week) to address physical and  functional deficits in the context of the above medical diagnosis(es)? Yes Addressing deficits in the following areas: balance, endurance, locomotion, transferring, bathing, dressing, toileting, swallowing and psychosocial support 5. Can the patient actively participate in an intensive therapy program of at least 3 hrs of therapy per day at least 5 days per week? Yes 6. The potential for patient to make measurable gains while on inpatient rehab is excellent 7. Anticipated functional outcomes upon discharge from inpatient rehab are modified independent and supervision  with PT, modified independent and supervision with OT, independent and modified independent with SLP. 8. Estimated rehab length of stay to reach the above functional goals is: 15-19 days. 9. Does the patient have adequate social supports and living environment to accommodate these discharge functional goals? Yes 10. Anticipated D/C setting: Home 11. Anticipated post D/C treatments: HH therapy and Home excercise program 12. Overall Rehab/Functional Prognosis: good  RECOMMENDATIONS: This patient's condition is appropriate for continued rehabilitative care in the following setting: CIR after completion of medical workup and medically stable Patient  has agreed to participate in recommended program. Yes Note that insurance prior authorization may be required for reimbursement for recommended care.  Comment: Rehab Admissions Coordinator to follow up.  Delice Lesch, MD, Mellody Drown Cathlyn Parsons., PA-C 06/20/2016    Revision History                                  Routing History

## 2016-06-22 NOTE — Progress Notes (Signed)
[name] Rehab Admission Coordinator Signed Physical Medicine and Rehabilitation  PMR Pre-admission Date of Service: 06/21/2016 2:17 PM  Related encounter: ED to Hosp-Admission (Discharged) from 06/19/2016 in [place]       [] Hide copied text PMR Admission Coordinator Pre-Admission Assessment  Patient: [name] is an 71 y.o., male MRN: [id number] DOB: September 28, 1945 Height:   Weight: (S) (!) 162.4 kg (358 lb 0.4 oz)                                                                                                                                                  Insurance Information HMO:     PPO:      PCP:      IPA:      80/20:      OTHER:  PRIMARY: Medicare A & B      Policy#: [id number]      Subscriber: Self CM Name:       Phone#:      Fax#:  Pre-Cert#: eligible per [employer]       Employer: Retired  Benefits:  Phone #:      Name:  Eff. Date: 04/03/2008     Deduct: $1340      Out of Pocket Max: N/A     Life Max: N/A CIR: 100%      SNF: 100% days 1-20; 80% days 21-100       Outpatient: 80%     Co-Pay: 20% Home Health: 100%      Co-Pay: 0 DME: 80%     Co-Pay: 20% Providers: patient's choice  SECONDARY: [employer]      Policy#: [id number]      Subscriber: Self CM Name:       Phone#:      Fax#:  Pre-Cert#:       Employer: Retired Benefits:  Phone #: [phone]     Name:  Eff. Date:      Deduct:       Out of Pocket Max:       Life Max:  CIR:       SNF:  Outpatient:      Co-Pay:  Home Health:       Co-Pay:  DME:      Co-Pay:   Medicaid Application Date:       Case Manager:  Disability Application Date:       Case Worker:   Emergency Contact Information        Contact Information    Name Relation Home Work Mobile   [name] Spouse [phone]  [phone]   [name] [phone]  [phone]     Current Medical History  Patient Admitting Diagnosis: Left medullary infarct   History of Present  Illness: [name] a 71 y.o.right handed malewith history of atrial fibrillation  andpulmonary emboli maintained on Eliquisas well as low-dose aspirin, hypertension, history of pericardial effusion s/pwindow, OSA on CPAP, diastolic congestive heart failure, bilateral TKA, morbid obesity with gastric banding procedure. Per chart review, patient patient lives with spouse. Independent prior to admissionusing a cane and walker at times due to history of knee replacement. One level home with 7 steps to entry.Wife with limited physical assist with history of CABG. Son in the area works.Presented 06/19/2016 with headache of sudden onset while watching television and lean to the left side as well as slurred speech. Cranial CT scan negative. Patient did not receive TPA. 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 No proximal occlusion of the intracranial arteries. No significant stenosis or dissection of the carotid arteries. MRI reviewed, showing small left medullary infarctas well as L V4occlusion.Interventional radiology consulted no need for emergent endovascular treatment at present time.Echocardiogram with ejection fraction 65% grade 1 diastolic dysfunction. Swallow study completed 06/21/2016 placed on a mechanical soft thin liquid diet.Neurology services consult infarct felt to be embolic secondary to known atrial fibrillation and remains on Eliquisas well as aspirin.Physicaland occupationaltherapy evaluationscompleted with recommendations of physical medicine rehabilitation consult. Patient was admitted for a comprehensive rehabilitation program   NIH Total: 2 Past Medical History      Past Medical History:  Diagnosis Date  . Anemia 07/29/2013  . Asthma    childhood asthma  . Benign prostatic hypertrophy    several prostate biopsies neg for cancer.   . Bilateral hip pain   . Cancer ([place])   . CHF (congestive heart  failure) ([place])   . Complication of anesthesia    MORPHINE CAUSES HALLUCINATIONS  . CVA (cerebral vascular accident) ([place]) 06/2016  . Depression   . FH: colonic polyps 2010  . Gout   . History of kidney stones   . Hypertension   . Kidney cysts   . Morbid obesity ([place])   . Numbness    feet / legs  . PAF (paroxysmal atrial fibrillation) with RVR 07/29/2013  . Sleep apnea    USES C PAP    Family History  family history includes Depression in his brother and mother; Diabetes in his cousin and paternal uncle; Heart disease in his father, paternal grandmother, and paternal uncle; Hypertension in his mother; Stroke in his paternal aunt.  Prior Rehab/Hospitalizations:  Has the patient had major surgery during 100 days prior to admission? No  Current Medications   Current Facility-Administered Medications:  .  acetaminophen (TYLENOL) tablet 1,000 mg, 1,000 mg, Oral, Q8H PRN, Edwin Dada, MD, 1,000 mg at 06/19/16 1849 .  apixaban (ELIQUIS) tablet 5 mg, 5 mg, Oral, BID, Thurnell Lose, MD, 5 mg at 06/21/16 1126 .  aspirin chewable tablet 81 mg, 81 mg, Oral, BID, Thurnell Lose, MD, 81 mg at 06/21/16 0954 .  atorvastatin (LIPITOR) tablet 80 mg, 80 mg, Oral, q1800, Thurnell Lose, MD .  finasteride (PROSCAR) tablet 5 mg, 5 mg, Oral, Daily, Edwin Dada, MD, 5 mg at 06/21/16 0934 .  flecainide (TAMBOCOR) tablet 50 mg, 50 mg, Oral, Q12H, Edwin Dada, MD, 50 mg at 06/21/16 1329 .  furosemide (LASIX) tablet 40 mg, 40 mg, Oral, Daily, Thurnell Lose, MD, 40 mg at 06/21/16 0954 .  hydrALAZINE (APRESOLINE) injection 10 mg, 10 mg, Intravenous, Q6H PRN, Thurnell Lose, MD .  LORazepam (ATIVAN) injection 0.5 mg, 0.5 mg, Intravenous, Q12H PRN, Thurnell Lose, MD .  metoprolol (LOPRESSOR) injection 5 mg, 5 mg, Intravenous, Q4H PRN, Thurnell Lose, MD .  mometasone-formoterol (DULERA) 200-5 MCG/ACT inhaler 2 puff, 2 puff, Inhalation, BID,  Edwin Dada, MD .  [DISCONTINUED] ondansetron [facility]) tablet 4 mg, 4 mg, Oral, Q6H PRN, 4 mg at 06/19/16 0856 **OR** ondansetron (ZOFRAN) injection 4 mg, 4 mg, Intravenous, Q6H PRN, Edwin Dada, MD, 4 mg at 06/19/16 1537 .  pantoprazole (PROTONIX) EC tablet 40 mg, 40 mg, Oral, Daily, Thurnell Lose, MD, 40 mg at 06/21/16 0954 .  risperiDONE (RISPERDAL) tablet 0.5 mg, 0.5 mg, Oral, QHS, Edwin Dada, MD .  sertraline (ZOLOFT) tablet 150 mg, 150 mg, Oral, Daily, Edwin Dada, MD, 150 mg at 06/21/16 0933 .  sodium chloride flush (NS) 0.9 % injection 3 mL, 3 mL, Intravenous, Q12H, Edwin Dada, MD, 3 mL at 06/21/16 1127  Patients Current Diet: DIET DYS 3 Room service appropriate? Yes; Fluid consistency: Thin  Precautions / Restrictions Precautions Precautions: Fall Precaution Comments: tendency to lean Lt Restrictions Weight Bearing Restrictions: No   Has the patient had 2 or more falls or a fall with injury in the past year?No, but reports fall farther out when in the yard.  Prior Activity Level Community (5-7x/wk): Prior to admission patient was fully independent but admits to nymbness in feet and balance issues on uneven surfaces.  He drove, ran errands with his wife, and worked on his stocks on the computer.   Home Assistive Devices / Equipment Home Assistive Devices/Equipment: Hearing aid, Eyeglasses, CPAP Home Equipment: Environmental consultant - 2 wheels, Cane - quad  Prior Device Use: Indicate devices/aids used by the patient prior to current illness, exacerbation or injury? None per patient's report, but note some mentioned above   Prior Functional Level Prior Function Level of Independence: Independent  Self Care: Did the patient need help bathing, dressing, using the toilet or eating?  Independent  Indoor Mobility: Did the patient need assistance with walking from room to room (with or without device)? Independent  Stairs: Did the  patient need assistance with internal or external stairs (with or without device)? Independent  Functional Cognition: Did the patient need help planning regular tasks such as shopping or remembering to take medications? Independent  Current Functional Level Cognition  Arousal/Alertness: Awake/alert Overall Cognitive Status: Within Functional Limits for tasks assessed Orientation Level: Oriented X4 Attention: Sustained (pt HOH impairing testing of auditory selective hearing) Sustained Attention: Appears intact Memory: Appears intact (recalled 5/5 word without cues after 5 minutes on MOCA ) Awareness: Appears intact Problem Solving: Appears intact Safety/Judgment: Appears intact    Extremity Assessment (includes Sensation/Coordination)  Upper Extremity Assessment: Defer to OT evaluation LUE Deficits / Details: drifting toward the L side, 4 out 5 MMT however able to break resistance  Lower Extremity Assessment: LLE deficits/detail LLE Deficits / Details: mild weakness compared to R LLE Sensation: history of peripheral neuropathy (in bilat LE)    ADLs  Overall ADL's : Needs assistance/impaired Eating/Feeding Details (indicate cue type and reason): pt reports change in voice quality and question need for SLP Grooming: Wash/dry hands, Wash/dry face, Set up Grooming Details (indicate cue type and reason): in recliner Upper Body Bathing: Minimal assistance Lower Body Bathing: Maximal assistance Lower Body Bathing Details (indicate cue type and reason): pt requires (A) for balance due to L lean Toilet Transfer: Moderate assistance, +2 for physical assistance, +2 for safety/equipment, RW, Stand-pivot Toilet Transfer Details (indicate cue type and reason): clinical judgement Functional mobility during ADLs:  Moderate assistance, +2 for safety/equipment, Rolling walker General ADL Comments: Pt complaining of vertigo and when head moves he gets very lightheaded    Mobility  Overal bed  mobility: Needs Assistance Bed Mobility: Supine to Sit, Sit to Supine Supine to sit: Min assist Sit to supine: Mod assist General bed mobility comments: pt up in chair upon arrival    Transfers  Overall transfer level: Needs assistance Equipment used: Rolling walker (2 wheeled) Transfers: Sit to/from Stand Sit to Stand: +2 physical assistance, Mod assist General transfer comment: cues for slow movements, decreasing reports of dizziness. Pt able to stand with rw and +2 assist X2 minutes. Pt briefly releasing rw. With fatigue, patient's hips drifting Lt, modest improvement with postural cues.     Ambulation / Gait / Stairs / Wheelchair Mobility  Ambulation/Gait Ambulation/Gait assistance: Mod assist, +2 physical assistance Ambulation Distance (Feet):  (3 side steps to [facility]) Assistive device: 2 person hand held assist General Gait Details: unable to attempt, pt reports LEs feel too weak right now.  Gait velocity: slow Gait velocity interpretation: Below normal speed for age/gender    Posture / Balance Balance Overall balance assessment: Needs assistance Sitting-balance support: No upper extremity supported, Feet supported Sitting balance-Leahy Scale: Fair Standing balance support: Bilateral upper extremity supported Standing balance-Leahy Scale: Poor Standing balance comment: requiring physical assist for standing    Special needs/care consideration BiPAP/CPAP: Yes, CPAP and has home set-up with him CPM: No Continuous Drip IV: No Dialysis: No         Life Vest: No Oxygen: No Special Bed: No Trach Size: No Wound Vac (area): No       Skin: WDL                               Bowel mgmt: 06/19/16 continent  Bladder mgmt: urinal continent  Diabetic mgmt: [id number]     Previous Home Environment Living Arrangements: Spouse/significant other  Lives With: Spouse Available Help at Discharge:  (resides with wife) Type of Home: House Home Layout: One level Home Access:  Stairs to enter Entrance Stairs-Rails: Left, Right Entrance Stairs-Number of Steps: 7 Bathroom Shower/Tub: Tub/shower unit and reports that he was unable to stand to shower prior to admission so he would get in the tub and bathe. Bathroom Toilet: Standard Home Care Services: No  Discharge Living Setting Plans for Discharge Living Setting: Patient's home, Lives with (comment) (Spouse) Type of Home at Discharge: House Discharge Home Layout: One level Discharge Home Access: Stairs to enter Entrance Stairs-Rails: Right, Left (cannot reach both at the same time`) Entrance Stairs-Number of Steps: front: 7 steps Discharge Bathroom Shower/Tub: Tub/shower unit, Curtain (took baths in the tub as he could not stand for showers ) Discharge Bathroom Toilet: Standard Discharge Bathroom Accessibility: Yes How Accessible: Accessible via walker Does the patient have any problems obtaining your medications?: No  Social/Family/Support Systems Patient Roles: Spouse, Parent, Other (Comment) (recent grandparent; son has a 46 month old daughter ) Contact Information: Spouse: [name] [phone] Anticipated Caregiver: [name] Anticipated Caregiver's Contact Information: see above Ability/Limitations of Caregiver: Supervision only  Caregiver Availability: 24/7 Discharge Plan Discussed with Primary Caregiver: No (discussed with patient) Is Caregiver In Agreement with Plan?: Yes Does Caregiver/Family have Issues with Lodging/Transportation while Pt is in Rehab?: No  Goals/Additional Needs Patient/Family Goal for Rehab: PT/OT/SLP Mod I - Supervision  Expected length of stay: 15-19 days  Cultural Considerations: None Dietary Needs: Dys.3 textures, pureed meats,  thin liquids no straws, meds whole in puree  Equipment Needs: TBD Special Service Needs: None Additional Information: None Pt/Family Agrees to Admission and willing to participate: Yes Program Orientation Provided & Reviewed with Pt/Caregiver  Including Roles  & Responsibilities: Yes Additional Information Needs: None Information Needs to be Provided By: N/A  Decrease burden of Care through IP rehab admission: No  Possible need for SNF placement upon discharge: No  Patient Condition: This patient's condition remains as documented in the consult dated 06/20/16, in which the Rehabilitation Physician determined and documented that the patient's condition is appropriate for intensive rehabilitative care in an inpatient rehabilitation facility. Will admit to inpatient rehab today.  Preadmission Screen Completed By:  [name], 06/21/2016 2:17 PM ______________________________________________________________________   Discussed status with Dr. Posey Pronto on 06/21/16 at 1430 and received telephone approval for admission today.  Admission Coordinator:  [name], time 1430/Date 06/21/16       Cosigned by: Ankit Lorie Phenix, MD at 06/21/2016 2:42 PM  Revision History

## 2016-06-22 NOTE — Evaluation (Signed)
Speech Language Pathology Assessment and Plan  Patient Details  Name: [name] MRN: [id number] Date of Birth: 1945-06-08  SLP Diagnosis: Dysarthria;Dysphagia  Rehab Potential: Excellent ELOS: 10-12 days     Today's Date: 06/22/2016 SLP Individual Time: 5732-2025 SLP Individual Time Calculation (min): 60 min   Problem List:  Patient Active Problem List   Diagnosis Date Noted  . Embolic cerebral infarction ([place]) 06/21/2016  . Stroke syndrome ([place])   . OSA (obstructive sleep apnea)   . Hyperlipidemia   . Left-sided weakness   . History of pulmonary embolism   . History of total knee arthroplasty, bilateral   . Dysphagia, post-stroke   . Hypokalemia   . Ataxia, post-stroke   . Neurologic gait disorder   . Headache 06/19/2016  . Claudication ([place]) 04/28/2016  . Encounter for monitoring flecainide therapy 04/28/2016  . Shortness of breath 04/28/2016  . Pure hyperglyceridemia 04/26/2016  . Skin lesion of chest wall 04/26/2016  . Recurrent pulmonary emboli ([place]) 06/13/2015  . PE (pulmonary embolism) 06/13/2015  . GERD (gastroesophageal reflux disease) 06/13/2015  . Asthma 06/13/2015  . Basal cell carcinoma of skin 06/28/2014  . 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 Sinus bradycardia 12/15/2013  . Abnormal serum level of alkaline phosphatase 08/01/2013  . PAF (paroxysmal atrial fibrillation) ([place]) 07/29/2013  . Chronic anticoagulation 07/22/2013  . Gastric ulcer with hemorrhage 07/20/13 07/22/2013  . Pulmonary thromboembolism- 07/16/13 07/16/2013  . Pericardial effusion s/p pericardial window 07/18/13 400cc 07/16/2013  . Diastolic congestive heart failure ([place]) 07/16/2013  . H/O laparoscopic adjustable gastric banding & hiatal hernia repair 04/08/13 04/23/2013  . Prediabetes 02/05/2013  . TMJ (temporomandibular joint syndrome) 09/11/2012  . Morbid obesity ([place]) 08/09/2012  . Peripheral neuropathy ([place]) 11/02/2009  . Hyperlipidemia with target LDL less  than 130 09/10/2008  . Depression 09/10/2008  . Obstructive sleep apnea 09/10/2008  . GOUT 06/28/2006  . HTN (hypertension) 06/28/2006  . BPH (benign prostatic hyperplasia) 06/28/2006   Past Medical History:  Past Medical History:  Diagnosis Date  . Anemia 07/29/2013  . Asthma    childhood asthma  . Benign prostatic hypertrophy    several prostate biopsies neg for cancer.   . Bilateral hip pain   . Cancer ([place])   . CHF (congestive heart failure) ([place])   . Complication of anesthesia    MORPHINE CAUSES HALLUCINATIONS  . CVA (cerebral vascular accident) ([place]) 06/2016  . Depression   . FH: colonic polyps 2010  . Gout   . History of kidney stones   . Hypertension   . Kidney cysts   . Morbid obesity ([place])   . Numbness    feet / legs  . PAF (paroxysmal atrial fibrillation) with RVR 07/29/2013  . Sleep apnea    USES C PAP   Past Surgical History:  Past Surgical History:  Procedure Laterality Date  . ARTHROSCOPY KNEE W/ DRILLING    . BREATH TEK H PYLORI N/A 12/23/2012   Procedure: BREATH TEK H PYLORI;  Surgeon: Gayland Curry, MD;  Location: [facility];  Service: General;  Laterality: N/A;  . CHOLECYSTECTOMY  1989  . COLONOSCOPY N/A 07/31/2013   Procedure: COLONOSCOPY;  Surgeon: Gatha Mayer, MD;  Location: [facility];  Service: Endoscopy;  Laterality: N/A;  . ESOPHAGOGASTRODUODENOSCOPY N/A 07/22/2013   Procedure: ESOPHAGOGASTRODUODENOSCOPY (EGD);  Surgeon: Irene Shipper, MD;  Location: [facility] ENDOSCOPY;  Service: Endoscopy;  Laterality: N/A;  . ESOPHAGOGASTRODUODENOSCOPY N/A 07/31/2013   Procedure: ESOPHAGOGASTRODUODENOSCOPY (EGD);  Surgeon: Gatha Mayer,  MD;  Location: [facility];  Service: Endoscopy;  Laterality: N/A;  . INTRAOPERATIVE TRANSESOPHAGEAL ECHOCARDIOGRAM N/A 07/18/2013   Procedure: INTRAOPERATIVE TRANSESOPHAGEAL ECHOCARDIOGRAM;  Surgeon: Grace Isaac, MD;  Location: [facility];  Service: Open Heart Surgery;  Laterality: N/A;  . LAPAROSCOPIC GASTRIC BANDING WITH  HIATAL HERNIA REPAIR N/A 04/08/2013   Procedure: LAPAROSCOPIC GASTRIC BANDING WITH possible  HIATAL HERNIA REPAIR;  Surgeon: Gayland Curry, MD;  Location: [facility];  Service: General;  Laterality: N/A;  . LITHOTRIPSY    . renal calculi    . SUBXYPHOID PERICARDIAL WINDOW N/A 07/18/2013   Procedure: SUBXYPHOID PERICARDIAL WINDOW;  Surgeon: Grace Isaac, MD;  Location: [facility];  Service: Thoracic;  Laterality: N/A;  . TONSILLECTOMY    . total knee replacment     bilat  . trigger finger repair     also lue, cts surgically repaired  . WISDOM TOOTH EXTRACTION      Assessment / Plan / Recommendation Clinical Impression 71 y.o. right handed male with history of atrial fibrillation and pulmonary emboli maintained on Eliquis as well as low-dose aspirin, hypertension, history of pericardial effusion s/p window, OSA on CPAP, diastolic congestive heart failure, bilateral TKA, morbid obesity with gastric banding procedure. Per chart review, patient patient lives with spouse. Independent prior to admission using a cane and walker at times due to history of knee replacement. One level home with 7 steps to entry. Wife with limited physical assist with history of CABG. Son in the area works. Presented 06/19/2016 with headache of sudden onset while watching television and lean to the left side as well as slurred speech. Cranial CT scan negative. Patient did not receive TPA. 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 No proximal occlusion of the intracranial arteries. No significant stenosis or dissection of the carotid arteries. 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 Interventional radiology consulted no need for emergent endovascular treatment at present time. Echocardiogram with ejection fraction 68% grade 1 diastolic dysfunction. Swallow study completed 06/21/2016 placed on a mechanical soft thin liquid diet. Neurology  services consult infarct felt to be embolic secondary to known atrial fibrillation and remains on Eliquis as well as aspirin.  Physical and occupational therapy evaluations completed with recommendations of physical medicine rehabilitation consult. Patient was admitted for a comprehensive rehabilitation program on 06/21/16.  Patient's cognitive-linguistic function appears [facility] for all tasks assessed. Patient is 100% intelligible but demonstrates a breathy and hoarse vocal quality with low vocal intensity. Patient consumed thin liquids via cup without overt s/s of aspiration and use of multiple swallows (2-3). Patient also consumed Dys. 3 textures and reported increased sensation of pharyngeal residue with multiple swallows (4-5). However, sensation and multiple swallows reduced with Dys. 2 textures. Recommend patient continue thin liquids but downgrade to Dys. 2 textures. Patient would benefit from skilled SLP intervention to maximize his swallowing function prior to discharge.    Skilled Therapeutic Interventions          Administered a cognitive-linguistic evaluation and BSE. Please see above for details.   SLP Assessment  Patient will need skilled Speech Lanaguage Pathology Services during CIR admission    Recommendations  SLP Diet Recommendations: Dysphagia 2 (Fine chop);Thin Liquid Administration via: Cup;No straw Medication Administration: Whole meds with puree (large pills may need to be crushed ) Supervision: Patient able to self feed;Intermittent supervision to cue for compensatory strategies Compensations: Slow rate;Small sips/bites;Multiple dry swallows  after each bite/sip;Clear throat intermittently Postural Changes and/or Swallow Maneuvers: Seated upright 90 degrees Oral Care Recommendations: Oral care BID Patient destination: Home Follow up Recommendations: Outpatient SLP Equipment Recommended: To be determined    SLP Frequency 3 to 5 out of 7 days   SLP Duration  SLP  Intensity  SLP Treatment/Interventions 10-12 days   Minumum of 1-2 x/day, 30 to 90 minutes  Dysphagia/aspiration precaution training;Speech/Language facilitation;Therapeutic Exercise;Patient/family education;Therapeutic Activities    Pain No reports of pain   Prior Functioning Type of Home: (P) House  Lives With: (P) Spouse Available Help at Discharge: (P) Family (spouse) Education: (P) Secretary/administrator, Conservation officer, nature and history focus Vocation: (P) Retired  Function:  Eating Eating   Modified Consistency Diet: Yes Eating Assist Level: Swallowing techniques: self managed;More than reasonable amount of time           Cognition Comprehension Comprehension assist level: Follows basic conversation/direction with extra time/assistive device  Expression   Expression assist level: Expresses basic needs/ideas: With extra time/assistive device  Social Interaction Social Interaction assist level: Interacts appropriately with others with medication or extra time (anti-anxiety, antidepressant).  Problem Solving Problem solving assist level: Solves complex problems: With extra time  Memory Memory assist level: More than reasonable amount of time   Short Term Goals: Week 1: SLP Short Term Goal 1 (Week 1): Patient will consume current diet with minimal overt s/s of aspiration with Mod I.  SLP Short Term Goal 2 (Week 1): Patient will consume trials of Dys. 3 textures without overt s/s of aspiration or reports of pharyngeal residuals over 2 sessions with supervision verbal cues prior to upgrde.    Refer to Care Plan for Long Term Goals  Recommendations for other services: None   Discharge Criteria: Patient will be discharged from SLP if patient refuses treatment 3 consecutive times without medical reason, if treatment goals not met, if there is a change in medical status, if patient makes no progress towards goals or if patient is discharged from hospital.  The above assessment, treatment plan,  treatment alternatives and goals were discussed and mutually agreed upon: by patient  Liborio Saccente 06/22/2016, 3:45 PM

## 2016-06-22 NOTE — Evaluation (Addendum)
Occupational Therapy Assessment and Plan  Patient Details  Name: [name] MRN: [id number] Date of Birth: [birth year]  OT Diagnosis: disturbance of vision and muscle weakness (generalized) Rehab Potential: Rehab Potential (ACUTE ONLY): Excellent ELOS: 12-14 days   Today's Date: 06/22/2016 OT Individual Time: 1100-1205 OT Individual Time Calculation (min): 65 min     Problem List:  Patient Active Problem List   Diagnosis Date Noted  . Embolic cerebral infarction ([place]) 06/21/2016  . Stroke syndrome ([place])   . OSA (obstructive sleep apnea)   . Hyperlipidemia   . Left-sided weakness   . History of pulmonary embolism   . History of total knee arthroplasty, bilateral   . Dysphagia, post-stroke   . Hypokalemia   . Ataxia, post-stroke   . Neurologic gait disorder   . Headache 06/19/2016  . Claudication ([place]) 04/28/2016  . Encounter for monitoring flecainide therapy 04/28/2016  . Shortness of breath 04/28/2016  . Pure hyperglyceridemia 04/26/2016  . Skin lesion of chest wall 04/26/2016  . Recurrent pulmonary emboli ([place]) 06/13/2015  . PE (pulmonary embolism) 06/13/2015  . GERD (gastroesophageal reflux disease) 06/13/2015  . Asthma 06/13/2015  . Basal cell carcinoma of skin 06/28/2014  . 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 Sinus bradycardia 12/15/2013  . Abnormal serum level of alkaline phosphatase 08/01/2013  . PAF (paroxysmal atrial fibrillation) ([place]) 07/29/2013  . Chronic anticoagulation 07/22/2013  . Gastric ulcer with hemorrhage 07/20/13 07/22/2013  . Pulmonary thromboembolism- 07/16/13 07/16/2013  . Pericardial effusion s/p pericardial window 07/18/13 400cc 07/16/2013  . Diastolic congestive heart failure ([place]) 07/16/2013  . H/O laparoscopic adjustable gastric banding & hiatal hernia repair 04/08/13 04/23/2013  . Prediabetes 02/05/2013  . TMJ (temporomandibular joint syndrome) 09/11/2012  . Morbid obesity ([place]) 08/09/2012  . Peripheral neuropathy ([place])  11/02/2009  . Hyperlipidemia with target LDL less than 130 09/10/2008  . Depression 09/10/2008  . Obstructive sleep apnea 09/10/2008  . GOUT 06/28/2006  . HTN (hypertension) 06/28/2006  . BPH (benign prostatic hyperplasia) 06/28/2006    Past Medical History:  Past Medical History:  Diagnosis Date  . Anemia 07/29/2013  . Asthma    childhood asthma  . Benign prostatic hypertrophy    several prostate biopsies neg for cancer.   . Bilateral hip pain   . Cancer ([place])   . CHF (congestive heart failure) ([place])   . Complication of anesthesia    MORPHINE CAUSES HALLUCINATIONS  . CVA (cerebral vascular accident) ([place]) 06/2016  . Depression   . FH: colonic polyps 2010  . Gout   . History of kidney stones   . Hypertension   . Kidney cysts   . Morbid obesity ([place])   . Numbness    feet / legs  . PAF (paroxysmal atrial fibrillation) with RVR 07/29/2013  . Sleep apnea    USES C PAP   Past Surgical History:  Past Surgical History:  Procedure Laterality Date  . ARTHROSCOPY KNEE W/ DRILLING    . BREATH TEK H PYLORI N/A 12/23/2012   Procedure: BREATH TEK H PYLORI;  Surgeon: Gayland Curry, MD;  Location: [facility];  Service: General;  Laterality: N/A;  . CHOLECYSTECTOMY  1989  . COLONOSCOPY N/A 07/31/2013   Procedure: COLONOSCOPY;  Surgeon: Gatha Mayer, MD;  Location: [facility];  Service: Endoscopy;  Laterality: N/A;  . ESOPHAGOGASTRODUODENOSCOPY N/A 07/22/2013   Procedure: ESOPHAGOGASTRODUODENOSCOPY (EGD);  Surgeon: Irene Shipper, MD;  Location[facility] END[facility]COPY;  Service: Endoscopy;  Laterality: N/A;  . ESOPHAGOGASTRODUODENOSCOPY N/A 07/31/2013  Procedure: ESOPHAGOGASTRODUODENOSCOPY (EGD);  Surgeon: Gatha Mayer, MD;  Location: [facility] ENDOSCOPY;  Service: Endoscopy;  Laterality: N/A;  . INTRAOPERATIVE TRANSESOPHAGEAL ECHOCARDIOGRAM N/A 07/18/2013   Procedure: INTRAOPERATIVE TRANSESOPHAGEAL ECHOCARDIOGRAM;  Surgeon: Grace Isaac, MD;  Location: [facility];  Service: Open Heart Surgery;   Laterality: N/A;  . LAPAROSCOPIC GASTRIC BANDING WITH HIATAL HERNIA REPAIR N/A 04/08/2013   Procedure: LAPAROSCOPIC GASTRIC BANDING WITH possible  HIATAL HERNIA REPAIR;  Surgeon: Gayland Curry, MD;  [facility];  Service: General;  Laterality: N/A;  . LITHOTRIPSY    . renal calculi    . SUBXYPHOID PERICARDIAL WINDOW N/A 07/18/2013   Procedure: SUBXYPHOID PERICARDIAL WINDOW;  Surgeon: Grace Isaac, MD;  Location: [facility];  Service: Thoracic;  Laterality: N/A;  . TONSILLECTOMY    . total knee replacment     bilat  . trigger finger repair     also lue, cts surgically repaired  . WISDOM TOOTH EXTRACTION      Assessment & Plan Clinical Impression: Patient is a 71 y.o. year old male with history of atrial fibrillation And pulmonary emboli maintained on Eliquis as well as low-dose aspirin, hypertension, history of pericardial effusion s/p window, OSA on CPAP, diastolic congestive heart failure, bilateral TKA, morbid obesity with gastric banding procedure. Presented 06/19/2016 with headache of sudden onset while watching television and lean to the left side as well as slurred speech. Cranial CT scan negative. Patient did not receive TPA. 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 No proximal occlusion of the intracranial arteries. No significant stenosis or dissection of the carotid arteries. 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 Interventional radiology consulted no need for emergent endovascular treatment at present time. Echocardiogram with ejection fraction 40% grade 1 diastolic dysfunction.Swallow study completed 06/21/2016 placed on a mechanical soft thin liquid diet. Neurology services consult infarct felt to be embolic secondary to known atrial fibrillation and remains on Eliquis as well as aspirin. Patient transferred to CIR on 06/21/2016 .    Patient currently requires mod/max with basic self-care  skills secondary to muscle weakness, diploplia, and decreased sitting balance, decreased standing balance, decreased postural control and decreased balance strategies.  Prior to hospitalization, patient could complete ADL with independent .  Patient will benefit from skilled intervention to increase independence with basic self-care skills prior to discharge home with care partner.  Anticipate patient will require Intermittent assistance for higher level iADL tasks and follow up home health.  OT - End of Session Endurance Deficit: Yes Endurance Deficit Description: Multiple rest breaks required during BADL tasks OT Assessment Rehab Potential (ACUTE ONLY): Excellent OT Patient demonstrates impairments in the following area(s): Balance;Endurance;Motor;Vision OT Basic ADL's Functional Problem(s): Grooming;Dressing;Bathing;Toileting OT Transfers Functional Problem(s): Toilet;Tub/Shower OT Additional Impairment(s): None OT Plan OT Intensity: Minimum of 1-2 x/day, 45 to 90 minutes OT Frequency: 5 out of 7 days OT Duration/Estimated Length of Stay: 10-12 days OT Treatment/Interventions: Medical illustrator training;Community reintegration;Discharge planning;DME/adaptive equipment instruction;Functional mobility training;Patient/family education;Self Care/advanced ADL retraining;Therapeutic Activities;UE/LE Strength taining/ROM;UE/LE Coordination activities;Visual/perceptual remediation/compensation OT Basic Self-Care Anticipated Outcome(s): Mod I OT Toileting Anticipated Outcome(s): Mod I OT Bathroom Transfers Anticipated Outcome(s): Mod I OT Recommendation Recommendations for Other Services: Therapeutic Recreation consult Therapeutic Recreation Interventions: Pet therapy Patient destination: Home Follow Up Recommendations: Home health OT Equipment Recommended: Tub/shower bench;To be determined   Skilled Therapeutic Intervention Skilled OT session completed with focus on initial evaluation,  education on OT role/POC,  establishment of patient-centered goals,  Vision deficits, and modified batthing/dressing. Pt reports dizziness and double vision, OT provided pt with eye patch over R eye prior to any OOB activities. Pt reported improved vision and transferred to sitting EOB with max A. Severe lateral and posterior lean initially requiring mod A and bilateral UE support to maintain sitting. Lateral scooting along EOB with Mod A 2/2 lateral LOB. 1 sit<>stand with Max A and RW, pt unable to maintain standing balance, with reports of increased dizziness and headache with position change.Bathing/dressing completed seated EOB with overall Mod A for LB ADLs and Min A for UB ADLs. Pt required Min/Mod A for dynamic sitting balance when reaching with B UEs to grasp objects outside base of support, but progressed to close supervision for static sitting balance by the end of session with unilateral UE support. Pt left seated EOB with spouse present to eat lunch. RN notified of pt status.  OT Evaluation Precautions/Restrictions  Precautions Precautions: Fall Restrictions Weight Bearing Restrictions: No Pain Pain Assessment Pain Assessment: 0-10 Pain Score: 6  Pain Type: Acute pain Pain Location: Head Pain Orientation: Left Pain Radiating Towards: left neck Pain Descriptors / Indicators: Aching Pain Frequency: Intermittent Pain Onset: Gradual Patients Stated Pain Goal: 3 Pain Intervention(s): Repositioned Home Living/Prior Functioning Home Living Family/patient expects to be discharged to:: Private residence Living Arrangements: Spouse/significant other Available Help at Discharge: Family (spouse) Type of Home: House Home Access: Stairs to enter Technical brewer of Steps: 7 Entrance Stairs-Rails: Left, Right Home Layout: One level Bathroom Shower/Tub: Optometrist: Yes  Lives With: Spouse IADL History Current License:  Yes Education: Secretary/administrator, Conservation officer, nature and history focus Occupation: Retired Tax adviser: Enjoys working on Cytogeneticist and spending time with family Prior Function Level of Independence: Independent with basic ADLs, Independent with homemaking with ambulation Vocation: Retired ADL ADL ADL Comments: Please see functional navigator Vision/Perception  Vision- History Baseline Vision/History: Wears glasses Wears Glasses: At all times Vision- Assessment Vision Assessment?: Yes Eye Alignment: Within Functional Limits Ocular Range of Motion: Within Functional Limits Alignment/Gaze Preference: Within Defined Limits Tracking/Visual Pursuits: Able to track stimulus in all quads without difficulty Visual Fields: No apparent deficits Diplopia Assessment: Disappears with one eye closed;Present all the time/all directions;Other (comment) (disappears with patch on R eye) Depth Perception:  (WFL)  Cognition Overall Cognitive Status: Within Functional Limits for tasks assessed Arousal/Alertness: Awake/alert Orientation Level: Person;Place;Situation Person: Oriented Place: Oriented Situation: Oriented Year: 2018 Month: March Day of Week: Correct Memory: Appears intact Immediate Memory Recall: Sock;Blue;Bed Memory Recall: Sock;Blue;Bed Memory Recall Sock: Without Cue Memory Recall Blue: Without Cue Memory Recall Bed: Without Cue Attention: Sustained Sustained Attention: Appears intact Awareness: Appears intact Problem Solving: Appears intact Safety/Judgment: Appears intact Sensation Sensation Light Touch: Appears Intact Coordination Gross Motor Movements are Fluid and Coordinated: No Fine Motor Movements are Fluid and Coordinated: Yes Mobility  Bed Mobility Bed Mobility: Supine to Sit Supine to Sit: 2: Max assist Supine to Sit Details: Verbal cues for technique  Balance Balance Balance Assessed: Yes Static Standing Balance Static Standing - Balance Support: Bilateral  upper extremity supported Static Standing - Level of Assistance: 3: Mod assist;4: Min assist Dynamic Standing Balance Dynamic Standing - Balance Support: During functional activity Dynamic Standing - Level of Assistance: 3: Mod assist Dynamic Standing - Balance Activities: Reaching for weighted objects Extremity/Trunk Assessment RUE Assessment RUE Assessment: Within Functional Limits LUE Assessment LUE Assessment: Within Functional Limits  See Function Navigator for Current Functional Status.  Refer to Care Plan for Long Term Goals  Recommendations for other services: Therapeutic Recreation  Pet therapy   Discharge Criteria: Patient will be discharged from OT if patient refuses treatment 3 consecutive times without medical reason, if treatment goals not met, if there is a change in medical status, if patient makes no progress towards goals or if patient is discharged from hospital.  The above assessment, treatment plan, treatment alternatives and goals were discussed and mutually agreed upon: by patient and by family  Valma Cava 06/22/2016, 4:00 PM

## 2016-06-22 NOTE — Evaluation (Signed)
Physical Therapy Assessment and Plan  Patient Details  Name: [name] MRN: [id number] Date of Birth: [birth year]  PT Diagnosis: Abnormality of gait, Ataxia, Coordination disorder, Impaired cognition and Impaired sensation Rehab Potential: Fair ELOS: 12-14 days   Today's Date: 06/22/2016 PT Individual Time: 9485-4627 PT Individual Time Calculation (min): 70 min    Problem List:  Patient Active Problem List   Diagnosis Date Noted  . Embolic cerebral infarction ([place]) 06/21/2016  . Stroke syndrome ([place])   . OSA (obstructive sleep apnea)   . Hyperlipidemia   . Left-sided weakness   . History of pulmonary embolism   . History of total knee arthroplasty, bilateral   . Dysphagia, post-stroke   . Hypokalemia   . Ataxia, post-stroke   . Neurologic gait disorder   . Headache 06/19/2016  . Claudication ([place]) 04/28/2016  . Encounter for monitoring flecainide therapy 04/28/2016  . Shortness of breath 04/28/2016  . Pure hyperglyceridemia 04/26/2016  . Skin lesion of chest wall 04/26/2016  . Recurrent pulmonary emboli ([place]) 06/13/2015  . PE (pulmonary embolism) 06/13/2015  . GERD (gastroesophageal reflux disease) 06/13/2015  . Asthma 06/13/2015  . Basal cell carcinoma of skin 06/28/2014  . 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 Sinus bradycardia 12/15/2013  . Abnormal serum level of alkaline phosphatase 08/01/2013  . PAF (paroxysmal atrial fibrillation) ([place]) 07/29/2013  . Chronic anticoagulation 07/22/2013  . Gastric ulcer with hemorrhage 07/20/13 07/22/2013  . Pulmonary thromboembolism- 07/16/13 07/16/2013  . Pericardial effusion s/p pericardial window 07/18/13 400cc 07/16/2013  . Diastolic congestive heart failure ([place]) 07/16/2013  . H/O laparoscopic adjustable gastric banding & hiatal hernia repair 04/08/13 04/23/2013  . Prediabetes 02/05/2013  . TMJ (temporomandibular joint syndrome) 09/11/2012  . Morbid obesity ([place]) 08/09/2012  . Peripheral neuropathy ([place])  11/02/2009  . Hyperlipidemia with target LDL less than 130 09/10/2008  . Depression 09/10/2008  . Obstructive sleep apnea 09/10/2008  . GOUT 06/28/2006  . HTN (hypertension) 06/28/2006  . BPH (benign prostatic hyperplasia) 06/28/2006    Past Medical History:  Past Medical History:  Diagnosis Date  . Anemia 07/29/2013  . Asthma    childhood asthma  . Benign prostatic hypertrophy    several prostate biopsies neg for cancer.   . Bilateral hip pain   . Cancer ([place])   . CHF (congestive heart failure) ([place])   . Complication of anesthesia    MORPHINE CAUSES HALLUCINATIONS  . CVA (cerebral vascular accident) ([place]) 06/2016  . Depression   . FH: colonic polyps 2010  . Gout   . History of kidney stones   . Hypertension   . Kidney cysts   . Morbid obesity ([place])   . Numbness    feet / legs  . PAF (paroxysmal atrial fibrillation) with RVR 07/29/2013  . Sleep apnea    USES C PAP   Past Surgical History:  Past Surgical History:  Procedure Laterality Date  . ARTHROSCOPY KNEE W/ DRILLING    . BREATH TEK H PYLORI N/A 12/23/2012   Procedure: BREATH TEK H PYLORI;  Surgeon: Gayland Curry, MD;  Location: [facility];  Service: General;  Laterality: N/A;  . CHOLECYSTECTOMY  1989  . COLONOSCOPY N/A 07/31/2013   Procedure: COLONOSCOPY;  Surgeon: Gatha Mayer, MD;  Location: [facility];  Service: Endoscopy;  Laterality: N/A;  . ESOPHAGOGASTRODUODENOSCOPY N/A 07/22/2013   Procedure: ESOPHAGOGASTRODUODENOSCOPY (EGD);  Surgeon: Irene Shipper, MD;  Location: [facility] ENDOSCOPY;  Service: Endoscopy;  Laterality: N/A;  . ESOPHAGOGASTRODUODENOSCOPY N/A 07/31/2013  Procedure: ESOPHAGOGASTRODUODENOSCOPY (EGD);  Surgeon: Gatha Mayer, MD;  Location: [facility] ENDOSCOPY;  Service: Endoscopy;  Laterality: N/A;  . INTRAOPERATIVE TRANSESOPHAGEAL ECHOCARDIOGRAM N/A 07/18/2013   Procedure: INTRAOPERATIVE TRANSESOPHAGEAL ECHOCARDIOGRAM;  Surgeon: Grace Isaac, MD;  Location: [facility];  Service: Open Heart Surgery;   Laterality: N/A;  . LAPAROSCOPIC GASTRIC BANDING WITH HIATAL HERNIA REPAIR N/A 04/08/2013   Procedure: LAPAROSCOPIC GASTRIC BANDING WITH possible  HIATAL HERNIA REPAIR;  Surgeon: Gayland Curry, MD;  Location: [facility];  Service: General;  Laterality: N/A;  . LITHOTRIPSY    . renal calculi    . SUBXYPHOID PERICARDIAL WINDOW N/A 07/18/2013   Procedure: SUBXYPHOID PERICARDIAL WINDOW;  Surgeon: Grace Isaac, MD;  Location: [place] [facility];  Service: Thoracic;  Laterality: N/A;  . TONSILLECTOMY    . total knee replacment     bilat  . trigger finger repair     also lue, cts surgically repaired  . WISDOM TOOTH EXTRACTION      Assessment & Plan Clinical Impression: [name] is a 71 y.o. right handed male with history of atrial fibrillation and pulmonary emboli maintained on Eliquis as well as low-dose aspirin, hypertension, history of pericardial effusion s/p window, OSA on CPAP, diastolic congestive heart failure, bilateral TKA, morbid obesity with gastric banding procedure. Per chart review, patient patient lives with spouse. Independent prior to admission using a cane and walker at times due to history of knee replacement. One level home with 7 steps to entry. Wife with limited physical assist with history of CABG. Son in the area works. Presented 06/19/2016 with headache of sudden onset while watching television and lean to the left side as well as slurred speech. Cranial CT scan negative. Patient did not receive TPA. 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 No proximal occlusion of the intracranial arteries. No significant stenosis or dissection of the carotid arteries. 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 Interventional radiology consulted no need for emergent endovascular treatment at present time. Echocardiogram with ejection fraction 72% grade 1 diastolic dysfunction. Swallow study completed 06/21/2016  placed on a mechanical soft thin liquid diet. Neurology services consult infarct felt to be embolic secondary to known atrial fibrillation and remains on Eliquis as well as aspirin.  Physical and occupational therapy evaluations completed with recommendations of physical medicine rehabilitation consult. Patient was admitted for a comprehensive rehabilitation program. Patient transferred to CIR on 06/21/2016 .   Patient currently requires mod with mobility secondary to muscle weakness, impaired timing and sequencing, ataxia and decreased coordination, decreased visual acuity, decreased visual perceptual skills and decreased visual motor skills, decreased midline orientation and decreased standing balance, decreased postural control and decreased balance strategies.  Prior to hospitalization, patient was independent  with mobility and lived with Spouse in a House home.  Home access is 7Stairs to enter.  Patient will benefit from skilled PT intervention to maximize safe functional mobility, minimize fall risk and decrease caregiver burden for planned discharge home with 24 hour supervision.  Anticipate patient will benefit from follow up [place] at discharge.  PT - End of Session Activity Tolerance: Tolerates 30+ min activity with multiple rests Endurance Deficit: Yes Endurance Deficit Description: Multiple rest breaks required during BADL tasks PT Assessment Rehab Potential (ACUTE/IP ONLY): Fair Barriers to Discharge: Inaccessible home environment;Decreased caregiver support PT Patient demonstrates impairments in the following area(s): Balance;Safety;Sensory;Endurance;Motor;Perception PT Transfers Functional Problem(s): Bed Mobility;Bed to Chair;Car;Furniture PT  Locomotion Functional Problem(s): Wheelchair Mobility;Ambulation;Stairs PT Plan PT Intensity: Minimum of 1-2 x/day ,45 to 90 minutes PT Frequency: 5 out of 7 days PT Duration Estimated Length of Stay: 12-14 days PT Treatment/Interventions:  Ambulation/gait training;Community reintegration;DME/adaptive equipment instruction;Neuromuscular re-education;Psychosocial support;Stair training;UE/LE Strength taining/ROM;Wheelchair propulsion/positioning;UE/LE Coordination activities;Therapeutic Activities;Functional electrical stimulation;Discharge planning;Balance/vestibular training;Cognitive remediation/compensation;Functional mobility training;Patient/family education;Splinting/orthotics;Therapeutic Exercise;Visual/perceptual remediation/compensation PT Transfers Anticipated Outcome(s): mod I  PT Locomotion Anticipated Outcome(s): supervision ambulatory, mod I w/c  PT Recommendation Recommendations for Other Services: Neuropsych consult;Therapeutic Recreation consult Therapeutic Recreation Interventions: Stress management Follow Up Recommendations: Home health PT;24 hour supervision/assistance Patient destination: Home Equipment Recommended: To be determined  Skilled Therapeutic Intervention No c/o pain at rest but does endorse HA with ambulation, ?autonomic response with effort.  PT provided pt education to pt and wife regarding ELOS, PT goals, and plan of care.  Pt completes transfers and gait as below.  Noted to be ataxic with ambulation and noted decreased proprioception in LUE and ? Decrease proprioception in trunk with ataxia/decreased coordination in trunk with transitional movements.  Pt returned to room at end of session and positioned in w/c with call bell in reach and needs met.  Educated on calling nurse for back to bed and pt verbalized understanding.   PT Evaluation Precautions/Restrictions Precautions Precautions: Fall Precaution Comments: dizzy, poor postural control Restrictions Weight Bearing Restrictions: No Pain Pain Assessment Pain Assessment: No/denies pain Pain Score: 3  Home Living/Prior Functioning Home Living Available Help at Discharge: Available 24 hours/day;Family Type of Home: House Home Access:  Stairs to enter [employer] of Steps: 7 Entrance Stairs-Rails: Left;Right Home Layout: One level Bathroom Shower/Tub: Chiropodist: Standard Bathroom Accessibility: Yes  Lives With: Spouse Prior Function Level of Independence: Independent with transfers;Independent with gait  Able to Take Stairs?: Yes Driving: Yes Vocation: Retired Vision/Perception  Vision - Risk analyst: Within Functional Limits Ocular Range of Motion: Within Functional Limits Alignment/Gaze Preference: Within Defined Limits Tracking/Visual Pursuits: Able to track stimulus in all quads without difficulty Convergence: Within functional limits Diplopia Assessment: Present all the time/all directions;Other (comment) (per report improves with 1 eye closed but doesn't disappear)  Cognition Overall Cognitive Status: Within Functional Limits for tasks assessed Arousal/Alertness: Awake/alert Orientation Level: Oriented X4 Attention: Sustained Sustained Attention: Appears intact Memory: Appears intact Awareness: Appears intact Problem Solving: Impaired Problem Solving Impairment: Verbal basic;Functional basic Safety/Judgment: Appears intact Sensation Sensation Light Touch: Appears Intact Proprioception: Impaired Detail Proprioception Impaired Details: Absent LUE Additional Comments: (+) thumb find test on L Coordination Gross Motor Movements are Fluid and Coordinated: No Fine Motor Movements are Fluid and Coordinated: No Coordination and Movement Description: ataxic Motor  Motor Motor: Ataxia;Abnormal postural alignment and control  Mobility Bed Mobility Bed Mobility: Supine to Sit Supine to Sit: 4: Min guard Supine to Sit Details: Verbal cues for technique Transfers Transfers: Yes Sit to Stand: 3: Mod assist Stand to Sit: 3: Mod assist Squat Pivot Transfers: 3: Mod assist Locomotion  Ambulation Ambulation: Yes Ambulation/Gait Assistance: 3: Mod  assist Ambulation Distance (Feet): 10 Feet Assistive device:  (rail in hallway) Gait Gait: Yes Gait Pattern: Ataxic;Decreased step length - right;Decreased step length - left;Decreased weight shift to right Stairs / Additional Locomotion Stairs: No Wheelchair Mobility Wheelchair Mobility: No  Trunk/Postural Assessment  Cervical Assessment Cervical Assessment: Within Functional Limits Thoracic Assessment Thoracic Assessment: Within Functional Limits Lumbar Assessment Lumbar Assessment: Within Functional Limits Postural Control Postural Control: Deficits on evaluation Righting Reactions: delayed Protective Responses: insufficient Postural Limitations: dereased  Balance Balance Balance Assessed:  Yes Static Standing Balance Static Standing - Balance Support: Right upper extremity supported Static Standing - Level of Assistance: 4: Min assist Dynamic Standing Balance Dynamic Standing - Balance Support: Right upper extremity supported;During functional activity Dynamic Standing - Level of Assistance: 3: Mod assist Dynamic Standing - Balance Activities: Reaching for weighted objects Extremity Assessment  RUE Assessment RUE Assessment: Within Functional Limits LUE Assessment LUE Assessment: Within Functional Limits RLE Assessment RLE Assessment: Within Functional Limits (5/5 throughout) LLE Assessment LLE Assessment: Within Functional Limits (4+/5)   See Function Navigator for Current Functional Status.   Refer to Care Plan for Long Term Goals  Recommendations for other services: Neuropsych and Therapeutic Recreation  Stress management  Discharge Criteria: Patient will be discharged from PT if patient refuses treatment 3 consecutive times without medical reason, if treatment goals not met, if there is a change in medical status, if patient makes no progress towards goals or if patient is discharged from hospital.  The above assessment, treatment plan, treatment  alternatives and goals were discussed and mutually agreed upon: by patient and by family  [name] 06/22/2016, 5:02 PM

## 2016-06-22 NOTE — Telephone Encounter (Signed)
Called pt to get him set-up for TCM hosp f/u appt no answer LMOM RTC...[name]

## 2016-06-22 NOTE — Progress Notes (Signed)
Subjective/Complaints: Double vision, "stacked images"  Improves with eye closure ROS- no CP, SOB, N/V/D Objective: Vital Signs: Blood pressure (!) 141/85, pulse (!) 54, temperature 98.4 F (36.9 C), temperature source Axillary, resp. rate 16, height 6' (1.829 m), weight (!) 151.7 kg (334 lb 6.4 oz), SpO2 94 %. Dg Swallowing Func-speech Pathology  Result Date: 06/21/2016 Objective Swallowing Evaluation: Type of Study: MBS-Modified Barium Swallow Study Patient Details Name: [name] MRN: [id number] Date of Birth: Sep 01, 1945 Today's Date: 06/21/2016 Time: SLP Start Time (ACUTE ONLY): 0754-SLP Stop Time (ACUTE ONLY): 0820 SLP Time Calculation (min) (ACUTE ONLY): 26 min Past Medical History: Past Medical History: Diagnosis Date . Anemia 07/29/2013 . Asthma   childhood asthma . Benign prostatic hypertrophy   several prostate biopsies neg for cancer.  . Bilateral hip pain  . Cancer ([place])  . CHF (congestive heart failure) ([place])  . Complication of anesthesia   MORPHINE CAUSES HALLUCINATIONS . Depression  . FH: colonic polyps 2010 . Gout  . History of kidney stones  . Hypertension  . Kidney cysts  . Morbid obesity ([place])  . Numbness   feet / legs . PAF (paroxysmal atrial fibrillation) with RVR 07/29/2013 . Sleep apnea   USES C PAP Past Surgical History: Past Surgical History: Procedure Laterality Date . ARTHROSCOPY KNEE W/ DRILLING   . BREATH TEK H PYLORI N/A 12/23/2012  Procedure: BREATH TEK H PYLORI;  Surgeon: Gayland Curry, MD;  Location: [facility];  Service: General;  Laterality: N/A; . CHOLECYSTECTOMY  1989 . COLONOSCOPY N/A 07/31/2013  Procedure: COLONOSCOPY;  Surgeon: Gatha Mayer, MD;  Location: [place];  Service: Endoscopy;  Laterality: N/A; . ESOPHAGOGASTRODUODENOSCOPY N/A 07/22/2013  Procedure: ESOPHAGOGASTRODUODENOSCOPY (EGD);  Surgeon: Irene Shipper, MD;  Location: [facility] ENDOSCOPY;  Service: Endoscopy;  Laterality: N/A; . ESOPHAGOGASTRODUODENOSCOPY N/A 07/31/2013  Procedure:  ESOPHAGOGASTRODUODENOSCOPY (EGD);  Surgeon: Gatha Mayer, MD;  Location: [facility] ENDOSCOPY;  Service: Endoscopy;  Laterality: N/A; . INTRAOPERATIVE TRANSESOPHAGEAL ECHOCARDIOGRAM N/A 07/18/2013  Procedure: INTRAOPERATIVE TRANSESOPHAGEAL ECHOCARDIOGRAM;  Surgeon: Grace Isaac, MD;  Location: [place];  Service: Open Heart Surgery;  Laterality: N/A; . LAPAROSCOPIC GASTRIC BANDING WITH HIATAL HERNIA REPAIR N/A 04/08/2013  Procedure: LAPAROSCOPIC GASTRIC BANDING WITH possible  HIATAL HERNIA REPAIR;  Surgeon: Gayland Curry, MD;  Location: [facility];  Service: General;  Laterality: N/A; . LITHOTRIPSY   . renal calculi   . SUBXYPHOID PERICARDIAL WINDOW N/A 07/18/2013  Procedure: SUBXYPHOID PERICARDIAL WINDOW;  Surgeon: Grace Isaac, MD;  Location: [place];  Service: Thoracic;  Laterality: N/A; . TONSILLECTOMY   . total knee replacment    bilat . trigger finger repair    also lue, cts surgically repaired . WISDOM TOOTH EXTRACTION   HPI: 71 yo male adm to [facility] with headache, found to have single focus of remote microhemorrhage in the L Pon, abnormal signal within V4 segment of L vertebral artery, faint focus of reduced diffusion within L medulla concerning for small infarct. Pt with worsening neurological symptoms during hospital stay including left sided weakness, dysphagia, vision deficits and dizziness.  Pt passed RNSSS initially- SlP swallow evaluation ordered due to pt's sudden onset of dysphagia.   Subjective: pt awake in chair Assessment / Plan / Recommendation CHL IP CLINICAL IMPRESSIONS 06/21/2016 Clinical Impression Pt presents with mild oropharyngeal dysphagia resulting in decreased swallow coordination.  Explosive coughing noted with large amount of aspiration of thin *pt cued to "gulp", aspirates were deep and did not clear.  Small single boluses of thin result in trace  laryngeal penetration of thin clearing with intermittent cued throat clear.  Chin tuck posture worsened swalllow/penetration.  Pharyngeal swallow c/b  decreased timing of laryngeal closure and decreased tongue base retraction/epiglottic deflection with mild vallecular residuals of thicker consistencies.  Fortunately pt sensed residuals and conducted dry swallow to clear.  Of note, pt frequently cleared his throat during evaluation/MBS and reports occured prior to admission - he attributes this to nasal drainage/allergies.  Using live video, educated pt to findings/recommendations.  SLP will follow up for skilled SLP treatment to maximize swallow/phonation function.     SLP Visit Diagnosis Dysphagia, oropharyngeal phase (R13.12) Attention and concentration deficit following -- Frontal lobe and executive function deficit following -- Impact on safety and function Mild aspiration risk   CHL IP TREATMENT RECOMMENDATION 06/21/2016 Treatment Recommendations Therapy as outlined in treatment plan below   Prognosis 06/21/2016 Prognosis for Safe Diet Advancement Good Barriers to Reach Goals -- Barriers/Prognosis Comment -- CHL IP DIET RECOMMENDATION 06/21/2016 SLP Diet Recommendations Dysphagia 3 (Mech soft) solids;Thin liquid Liquid Administration via Cup;No straw Medication Administration Whole meds with puree Compensations Slow rate;Small sips/bites;Multiple dry swallows after each bite/sip;Clear throat intermittently Postural Changes Remain semi-upright after after feeds/meals (Comment);Seated upright at 90 degrees   CHL IP OTHER RECOMMENDATIONS 06/21/2016 Recommended Consults -- Oral Care Recommendations Oral care BID Other Recommendations --   CHL IP FOLLOW UP RECOMMENDATIONS 06/21/2016 Follow up Recommendations Inpatient Rehab   CHL IP FREQUENCY AND DURATION 06/21/2016 Speech Therapy Frequency (ACUTE ONLY) min 2x/week Treatment Duration --      CHL IP ORAL PHASE 06/21/2016 Oral Phase Impaired Oral - Pudding Teaspoon -- Oral - Pudding Cup -- Oral - Honey Teaspoon -- Oral - Honey Cup -- Oral - Nectar Teaspoon -- Oral - Nectar Cup WFL Oral - Nectar Straw -- Oral - Thin  Teaspoon WFL Oral - Thin Cup WFL Oral - Thin Straw WFL Oral - Puree WFL Oral - Mech Soft -- Oral - Regular WFL Oral - Multi-Consistency -- Oral - Pill Weak lingual manipulation;Reduced posterior propulsion;Delayed oral transit Oral Phase - Comment --  CHL IP PHARYNGEAL PHASE 06/21/2016 Pharyngeal Phase Impaired Pharyngeal- Pudding Teaspoon -- Pharyngeal -- Pharyngeal- Pudding Cup -- Pharyngeal -- Pharyngeal- Honey Teaspoon -- Pharyngeal -- Pharyngeal- Honey Cup -- Pharyngeal -- Pharyngeal- Nectar Teaspoon WFL Pharyngeal -- Pharyngeal- Nectar Cup -- Pharyngeal -- Pharyngeal- Nectar Straw -- Pharyngeal -- Pharyngeal- Thin Teaspoon WFL Pharyngeal -- Pharyngeal- Thin Cup Penetration/Aspiration during swallow;Pharyngeal residue - valleculae Pharyngeal Material enters airway, remains ABOVE vocal cords and not ejected out Pharyngeal- Thin Straw Reduced laryngeal elevation;Significant aspiration (Amount) Pharyngeal -- Pharyngeal- Puree Reduced epiglottic inversion;Reduced tongue base retraction;Pharyngeal residue - valleculae Pharyngeal -- Pharyngeal- Mechanical Soft -- Pharyngeal -- Pharyngeal- Regular Reduced epiglottic inversion;Reduced tongue base retraction;Pharyngeal residue - valleculae Pharyngeal -- Pharyngeal- Multi-consistency -- Pharyngeal -- Pharyngeal- Pill Reduced epiglottic inversion;Reduced tongue base retraction;Pharyngeal residue - valleculae Pharyngeal -- Pharyngeal Comment --  CHL IP CERVICAL ESOPHAGEAL PHASE 06/21/2016 Cervical Esophageal Phase WFL Pudding Teaspoon -- Pudding Cup -- Honey Teaspoon -- Honey Cup -- Nectar Teaspoon -- Nectar Cup -- Nectar Straw -- Thin Teaspoon -- Thin Cup -- Thin Straw -- Puree -- Mechanical Soft -- Regular -- Multi-consistency -- Pill -- Cervical Esophageal Comment -- Luanna Salk, MS [facility] SLP [phone]              Results for orders placed or performed during the hospital encounter of 06/21/16 (from the past 72 hour(s))  CBC WITH DIFFERENTIAL     Status: Abnormal  Collection Time: 06/22/16  5:50 AM  Result Value Ref Range   WBC 12.7 (H) 4.0 - 10.5 K/uL   RBC 5.24 4.22 - 5.81 MIL/uL   Hemoglobin 15.2 13.0 - 17.0 g/dL   HCT 46.7 39.0 - 52.0 %   MCV 89.1 78.0 - 100.0 fL   MCH 29.0 26.0 - 34.0 pg   MCHC 32.5 30.0 - 36.0 g/dL   RDW 14.2 11.5 - 15.5 %   Platelets 183 150 - 400 K/uL   Neutrophils Relative % 76 %   Neutro Abs 9.7 (H) 1.7 - 7.7 K/uL   Lymphocytes Relative 12 %   Lymphs Abs 1.5 0.7 - 4.0 K/uL   Monocytes Relative 11 %   Monocytes Absolute 1.3 (H) 0.1 - 1.0 K/uL   Eosinophils Relative 1 %   Eosinophils Absolute 0.1 0.0 - 0.7 K/uL   Basophils Relative 0 %   Basophils Absolute 0.0 0.0 - 0.1 K/uL     HEENT: vertical diplopia in all visual fields Cardio: RRR and no murmur Resp: CTA B/L and unlabored GI: BS positive and NT, ND Extremity:  No Edema Skin:   Intact Neuro: Alert/Oriented, Normal Sensory and Other 5/5 BUE and 4/5 BLE, voice is hoarse Musc/Skel:  Normal Gen NAD   Assessment/Plan: 1. 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 Physiatrist is providing close team supervision and 24 hour management of active medical problems listed below. Physiatrist and rehab team continue to assess barriers to discharge/monitor patient progress toward functional and medical goals. FIM:                                  Medical Problem List and Plan: 1.  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  DVT Prophylaxis/Anticoagulation: Eliquis 3. Pain Management: Tylenol as needed 4. Mood: Risperdal 0.5 mg daily at bedtime, Zoloft 150 mg daily, will monitor for adjustment 5. Neuropsych: This patient is capable of making decisions on his own behalf. 6. Skin/Wound Care: Routine  skin checks 7. Fluids/Electrolytes/Nutrition: Routine I&O with follow-up chemistries 8. Dysphagia. Mechanical soft thin liquid diet. Follow-up speech therapy. Monitor for any signs of aspiration 9. Atrial fibrillation with history of pulmonary emboli. Tambocor 50 mg every 12 hours. Cardiac rate controlled. 10. OSA/CPAP. Check oxygen saturations every shift 11. Diastolic congestive heart failure. Monitor for any signs of fluid overload. Lasix 40 mg daily 12. Hypertension: Monitor BP 13. Morbid obesity. BMI 48.56. Dietary follow-up, slow wt loss rec 14. BPH. Proscar 5 mg daily. Check PVRs 3 15. Hyperlipidemia. Lipitor 16. History of bilateral TKA. Patient used a Programmer, multimedia prior to admission  LOS (Days) 1 A FACE TO [place] E 06/22/2016, 6:29 AM

## 2016-06-22 NOTE — Progress Notes (Signed)
Pt has home unit CPAP states that he doesn't need assistance with it.

## 2016-06-23 ENCOUNTER — Inpatient Hospital Stay (HOSPITAL_COMMUNITY): Payer: Medicare Other | Admitting: Speech Pathology

## 2016-06-23 ENCOUNTER — Inpatient Hospital Stay (HOSPITAL_COMMUNITY): Payer: Self-pay | Admitting: Physical Therapy

## 2016-06-23 ENCOUNTER — Inpatient Hospital Stay (HOSPITAL_COMMUNITY): Payer: Self-pay | Admitting: Occupational Therapy

## 2016-06-23 DIAGNOSIS — R339 Retention of urine, unspecified: Secondary | ICD-10-CM

## 2016-06-23 NOTE — Progress Notes (Signed)
Inpatient Rehabilitation Center Individual Statement of Services  Patient Name:  [name]  Date:  06/23/2016  Welcome to the [facility].  Our goal is to provide you with an individualized program based on your diagnosis and situation, designed to meet your specific needs.  With this comprehensive rehabilitation program, you will be expected to participate in at least 3 hours of rehabilitation therapies Monday-Friday, with modified therapy programming on the weekends.  Your rehabilitation program will include the following services:  Physical Therapy (PT), Occupational Therapy (OT), Speech Therapy (ST), 24 hour per day rehabilitation nursing, Case Management (Social Worker), Rehabilitation Medicine, Nutrition Services and Pharmacy Services  Weekly team conferences will be held on Wednesdays to discuss your progress.  Your Social Worker will talk with you frequently to get your input and to update you on team discussions.  Team conferences with you and your family in attendance may also be held.  Expected length of stay:  12 to 14 days  Overall anticipated outcome:  Supervision and modified independent with minimal assistance with stairs  Depending on your progress and recovery, your program may change. Your Social Worker will coordinate services and will keep you informed of any changes. Your Social Worker's name and contact numbers are listed  below.  The following services may also be recommended but are not provided by the [facility] will be made to provide these services after discharge if needed.  Arrangements include referral to agencies that provide these services.  Your insurance has been verified to be:  Medicare and [employer] Your primary doctor is:  Dr. [name]  Pertinent information will be shared with your  doctor and your insurance company.  Social Worker:  Alfonse Alpers, LCSW  [phone] or [phone]  Information discussed with and copy given to patient by: Trey Sailors, 06/23/2016, 3:09 PM

## 2016-06-23 NOTE — Progress Notes (Signed)
Speech Language Pathology Daily Session Note  Patient Details  Name: [name] MRN: [id number] Date of Birth: 25-Aug-1945  Today's Date: 06/23/2016 SLP Individual Time: 1300-1330 SLP Individual Time Calculation (min): 30 min  Short Term Goals: Week 1: SLP Short Term Goal 1 (Week 1): Patient will consume current diet with minimal overt s/s of aspiration with Mod I.  SLP Short Term Goal 2 (Week 1): Patient will consume trials of Dys. 3 textures without overt s/s of aspiration or reports of pharyngeal residuals over 2 sessions with supervision verbal cues prior to upgrde.   Skilled Therapeutic Interventions: Skilled treatment session focused on dysphagia goals. Patient reported that he was been completing his pharyngeal strengthening exercises and that they appear "easier" today.  Patient consumed trials of Dys. 3 textures and demonstrated efficient mastication without overt s/s of aspiration. Patient verbalized a decreased globus sensation and only utilized 1-2 swallows per bite. Recommend continued trials X 1 more session prior to upgrade. Patient left upright in bed with all needs within reach. Continue with current plan of care.      Function:  Eating Eating   Modified Consistency Diet: Yes Eating Assist Level: Swallowing techniques: self managed;More than reasonable amount of time   Eating Set Up Assist For: Opening containers       Cognition Comprehension Comprehension assist level: Follows basic conversation/direction with no assist  Expression   Expression assist level: Expresses basic needs/ideas: With no assist  Social Interaction Social Interaction assist level: Interacts appropriately 90% of the time - Needs monitoring or encouragement for participation or interaction.  Problem Solving Problem solving assist level: Solves basic problems with no assist  Memory Memory assist level: More than reasonable amount of time    Pain No/Denies Pain   Therapy/Group:  Individual Therapy  Emilygrace Grothe 06/23/2016, 3:50 PM

## 2016-06-23 NOTE — Progress Notes (Signed)
Physical Therapy Session Note  Patient Details  Name: [name] MRN: [id number] Date of Birth: 14-May-1945  Today's Date: 06/23/2016 PT Individual Time: 1330-1430 and 1545-1630 PT Individual Time Calculation (min): 60 min and 45 min   Short Term Goals: Week 1:  PT Short Term Goal 1 (Week 1): Pt will transfer with min assist PT Short Term Goal 2 (Week 1): pt will ambulate 100' with LRAD and min assist PT Short Term Goal 3 (Week 1): Pt will initiate stair negotiation with PT PT Short Term Goal 4 (Week 1): Pt will propel w/c x50' with supervision  Skilled Therapeutic Interventions/Progress Updates:    Session 1: no c/o pain.  Session focus on transfers, balance, and NMR.    Pt transfers bed>w/c with mod assist to control pivot.  Car transfer with mod assist for squat/pivot but pt able to lift BLEs into car without assist.  Stair negotiation x8 steps with 2 rails (6"+3") and min assist to ascend, mod assist to descend 3" steps for balance.  PT instructed pt in repeated sit<>stand at // bars (side) focus on equal weight bearing through LEs and maintaining midline posture throughout transfers.  Marching at // bars with initial mod fade to min cues for L foot placement to L of midline, as pt tended to return LLE to R of midline during exercise.  Seated LLE NMR focus on coordination and graded movement tapping LLE to target x3 trials to fatigue with 4# ankle weight for improvement in proprioception.  Pt returned to room at end of session and left upright in w/c with call bell in reach and needs met.   Session 2: no c/o pain.  Session focus on NMR for transfers and postural control.  Pt transfers sit<>stand with RW and min/mod assist for balance and engaged in [place] task focus on R weight shift and intermittent UE support on Rw.  Pt requires up to mod assist to maintain balance with single UE support on RW.  Transitioned to sitting edge of mat, blocked practice 5+9 reps sit<>squat with  BUE on R knee and verbal cues to focus towards the R visual field with pt improving from needing mod assist to steadying assist for balance.  Progress to sit<>stand in same manner with initial min assist fade to light steadying assist.  Progress to sit>stand and retrieving clothespins from previous activity and returning to plastic bag (10 reps of sit<>stand) with steadying assist for balance in standing.  Pt returned to room in w/c at end of session with supervision to propel with BUEs an transferred back to bed with min assist.  Call bell in reach and needs met.   Therapy Documentation Precautions:  Precautions Precautions: Fall Precaution Comments: dizzy, poor postural control Restrictions Weight Bearing Restrictions: No   See Function Navigator for Current Functional Status.   Therapy/Group: Individual Therapy  [name] 06/23/2016, 4:44 PM

## 2016-06-23 NOTE — Telephone Encounter (Signed)
Called pt spoke w/wife she stated husband did not come home he is in rehab...[name]

## 2016-06-23 NOTE — Progress Notes (Signed)
Occupational Therapy Session Note  Patient Details  Name: [name] MRN: [id number] Date of Birth: 26-Nov-1945  Today's Date: 06/23/2016 OT Individual Time: 0900-1000 OT Individual Time Calculation (min): 60 min   Short Term Goals: Week 1:  OT Short Term Goal 1 (Week 1): Pt will maintain sitting balance while performing ADL task with supervision OT Short Term Goal 2 (Week 1): Pt will complete toilet transfer with min A OT Short Term Goal 3 (Week 1): Pt will tolerate standing at the sink for 2 grooming tasks with min A for balance  Skilled Therapeutic Interventions/Progress Updates:    Patch moved to L eye today to address diplopia. Squat pivot wide drop arm BSC>wc. Modified bathing/dressing at the sink, tactile cues to maintain midline 2/2 lateral lean to L during ADL tasks. Max A to thread pants today 2/2 ataxia and difficulty leaning forward. Max A sit<>stand and mod A to maintain standing balance while OT assisted with washing buttocks and pulling pants over hips. Multiple rest breaks required to recover from dizziness and fatigue.  Pt left seated in wc at end of session with needs met and safety belt on.   Therapy Documentation Precautions:  Precautions Precautions: Fall Precaution Comments: dizzy, poor postural control Restrictions Weight Bearing Restrictions: No Pain: Pain Assessment Pain Assessment: No/denies pain Pain Score: 6  Pain Type: Acute pain Pain Location: Head Pain Orientation: Left Pain Descriptors / Indicators: Aching Pain Intervention(s): Repositioned ADL: ADL ADL Comments: Please see functional navigator  See Function Navigator for Current Functional Status.   Therapy/Group: Individual Therapy  Valma Cava 06/23/2016, 9:32 AM

## 2016-06-23 NOTE — Progress Notes (Signed)
Pt LBM 3-19. RN educated on importance of regular bowel movements and encouraged pt to take PRN sorbitol. Pt refused stating, "I haven't been eating much at all. I don't need anything for my bowels yet. I think I will be okay." RN will continue to educate on bowels and attempt this evening if no BM today.

## 2016-06-23 NOTE — Progress Notes (Signed)
Subjective/Complaints: No BM since Monday Per RN no void since yest ROS- no CP, SOB, N/V/D Objective: Vital Signs: Blood pressure (!) 146/69, pulse (!) 55, temperature 99.7 F (37.6 C), temperature source Oral, resp. rate 18, height 6' (1.829 m), weight (!) 151.7 kg (334 lb 6.4 oz), SpO2 98 %. Dg Swallowing Func-speech Pathology  Result Date: 06/21/2016 Objective Swallowing Evaluation: Type of Study: MBS-Modified Barium Swallow Study Patient Details Name: [name] MRN: [id number] Date of Birth: 1945-06-12 Today's Date: 06/21/2016 Time: SLP Start Time (ACUTE ONLY): 0754-SLP Stop Time (ACUTE ONLY): 0820 SLP Time Calculation (min) (ACUTE ONLY): 26 min Past Medical History: Past Medical History: Diagnosis Date . Anemia 07/29/2013 . Asthma   childhood asthma . Benign prostatic hypertrophy   several prostate biopsies neg for cancer.  . Bilateral hip pain  . Cancer ([place])  . CHF (congestive heart failure) ([place])  . Complication of anesthesia   MORPHINE CAUSES HALLUCINATIONS . Depression  . FH: colonic polyps 2010 . Gout  . History of kidney stones  . Hypertension  . Kidney cysts  . Morbid obesity ([name])  . Numbness   feet / legs . PAF (paroxysmal atrial fibrillation) with RVR 07/29/2013 . Sleep apnea   USES C PAP Past Surgical History: Past Surgical History: Procedure Laterality Date . ARTHROSCOPY KNEE W/ DRILLING   . BREATH TEK H PYLORI N/A 12/23/2012  Procedure: BREATH TEK H PYLORI;  Surgeon: Gayland Curry, MD;  Location: [facility];  Service: General;  Laterality: N/A; . CHOLECYSTECTOMY  1989 . COLONOSCOPY N/A 07/31/2013  Procedure: COLONOSCOPY;  Surgeon: Gatha Mayer, MD;  Location: [place];  Service: Endoscopy;  Laterality: N/A; . ESOPHAGOGASTRODUODENOSCOPY N/A 07/22/2013  Procedure: ESOPHAGOGASTRODUODENOSCOPY (EGD);  Surgeon: Irene Shipper, MD;  Location: [facility] END[facility]COPY;  Service: Endoscopy;  Laterality: N/A; . ESOPHAGOGASTRODUODENOSCOPY N/A 07/31/2013  Procedure: ESOPHAGOGASTRODUODENOSCOPY (EGD);   Surgeon: Gatha Mayer, MD;  Location: [facility] ENDOSCOPY;  Service: Endoscopy;  Laterality: N/A; . INTRAOPERATIVE TRANSESOPHAGEAL ECHOCARDIOGRAM N/A 07/18/2013  Procedure: INTRAOPERATIVE TRANSESOPHAGEAL ECHOCARDIOGRAM;  Surgeon: Grace Isaac, MD;  Location: [place];  Service: Open Heart Surgery;  Laterality: N/A; . LAPAROSCOPIC GASTRIC BANDING WITH HIATAL HERNIA REPAIR N/A 04/08/2013  Procedure: LAPAROSCOPIC GASTRIC BANDING WITH possible  HIATAL HERNIA REPAIR;  Surgeon: Gayland Curry, MD;  Location: [facility];  Service: General;  Laterality: N/A; . LITHOTRIPSY   . renal calculi   . SUBXYPHOID PERICARDIAL WINDOW N/A 07/18/2013  Procedure: SUBXYPHOID PERICARDIAL WINDOW;  Surgeon: Grace Isaac, MD;  Location: [place];  Service: Thoracic;  Laterality: N/A; . TONSILLECTOMY   . total knee replacment    bilat . trigger finger repair    also lue, cts surgically repaired . WISDOM TOOTH EXTRACTION   HPI: 71 yo male adm [facility] with headache, found to have single focus of remote microhemorrhage in the L Pon, abnormal signal within V4 segment of L vertebral artery, faint focus of reduced diffusion within L medulla concerning for small infarct. Pt with worsening neurological symptoms during hospital stay including left sided weakness, dysphagia, vision deficits and dizziness.  Pt passed RNSSS initially- SlP swallow evaluation ordered due to pt's sudden onset of dysphagia.   Subjective: pt awake in chair Assessment / Plan / Recommendation CHL IP CLINICAL IMPRESSIONS 06/21/2016 Clinical Impression Pt presents with mild oropharyngeal dysphagia resulting in decreased swallow coordination.  Explosive coughing noted with large amount of aspiration of thin *pt cued to "gulp", aspirates were deep and did not clear.  Small single boluses of thin result in  trace laryngeal penetration of thin clearing with intermittent cued throat clear.  Chin tuck posture worsened swalllow/penetration.  Pharyngeal swallow c/b decreased timing of laryngeal  closure and decreased tongue base retraction/epiglottic deflection with mild vallecular residuals of thicker consistencies.  Fortunately pt sensed residuals and conducted dry swallow to clear.  Of note, pt frequently cleared his throat during evaluation/MBS and reports occured prior to admission - he attributes this to nasal drainage/allergies.  Using live video, educated pt to findings/recommendations.  SLP will follow up for skilled SLP treatment to maximize swallow/phonation function.     SLP Visit Diagnosis Dysphagia, oropharyngeal phase (R13.12) Attention and concentration deficit following -- Frontal lobe and executive function deficit following -- Impact on safety and function Mild aspiration risk   CHL IP TREATMENT RECOMMENDATION 06/21/2016 Treatment Recommendations Therapy as outlined in treatment plan below   Prognosis 06/21/2016 Prognosis for Safe Diet Advancement Good Barriers to Reach Goals -- Barriers/Prognosis Comment -- CHL IP DIET RECOMMENDATION 06/21/2016 SLP Diet Recommendations Dysphagia 3 (Mech soft) solids;Thin liquid Liquid Administration via Cup;No straw Medication Administration Whole meds with puree Compensations Slow rate;Small sips/bites;Multiple dry swallows after each bite/sip;Clear throat intermittently Postural Changes Remain semi-upright after after feeds/meals (Comment);Seated upright at 90 degrees   CHL IP OTHER RECOMMENDATIONS 06/21/2016 Recommended Consults -- Oral Care Recommendations Oral care BID Other Recommendations --   CHL IP FOLLOW UP RECOMMENDATIONS 06/21/2016 Follow up Recommendations Inpatient Rehab   CHL IP FREQUENCY AND DURATION 06/21/2016 Speech Therapy Frequency (ACUTE ONLY) min 2x/week Treatment Duration --      CHL IP ORAL PHASE 06/21/2016 Oral Phase Impaired Oral - Pudding Teaspoon -- Oral - Pudding Cup -- Oral - Honey Teaspoon -- Oral - Honey Cup -- Oral - Nectar Teaspoon -- Oral - Nectar Cup WFL Oral - Nectar Straw -- Oral - Thin Teaspoon WFL Oral - Thin Cup WFL Oral  - Thin Straw WFL Oral - Puree WFL Oral - Mech Soft -- Oral - Regular WFL Oral - Multi-Consistency -- Oral - Pill Weak lingual manipulation;Reduced posterior propulsion;Delayed oral transit Oral Phase - Comment --  CHL IP PHARYNGEAL PHASE 06/21/2016 Pharyngeal Phase Impaired Pharyngeal- Pudding Teaspoon -- Pharyngeal -- Pharyngeal- Pudding Cup -- Pharyngeal -- Pharyngeal- Honey Teaspoon -- Pharyngeal -- Pharyngeal- Honey Cup -- Pharyngeal -- Pharyngeal- Nectar Teaspoon WFL Pharyngeal -- Pharyngeal- Nectar Cup -- Pharyngeal -- Pharyngeal- Nectar Straw -- Pharyngeal -- Pharyngeal- Thin Teaspoon WFL Pharyngeal -- Pharyngeal- Thin Cup Penetration/Aspiration during swallow;Pharyngeal residue - valleculae Pharyngeal Material enters airway, remains ABOVE vocal cords and not ejected out Pharyngeal- Thin Straw Reduced laryngeal elevation;Significant aspiration (Amount) Pharyngeal -- Pharyngeal- Puree Reduced epiglottic inversion;Reduced tongue base retraction;Pharyngeal residue - valleculae Pharyngeal -- Pharyngeal- Mechanical Soft -- Pharyngeal -- Pharyngeal- Regular Reduced epiglottic inversion;Reduced tongue base retraction;Pharyngeal residue - valleculae Pharyngeal -- Pharyngeal- Multi-consistency -- Pharyngeal -- Pharyngeal- Pill Reduced epiglottic inversion;Reduced tongue base retraction;Pharyngeal residue - valleculae Pharyngeal -- Pharyngeal Comment --  CHL IP CERVICAL ESOPHAGEAL PHASE 06/21/2016 Cervical Esophageal Phase WFL Pudding Teaspoon -- Pudding Cup -- Honey Teaspoon -- Honey Cup -- Nectar Teaspoon -- Nectar Cup -- Nectar Straw -- Thin Teaspoon -- Thin Cup -- Thin Straw -- Puree -- Mechanical Soft -- Regular -- Multi-consistency -- Pill -- Cervical Esophageal Comment -- Luanna Salk, MS [facility] SLP [phone]              Results for orders placed or performed during the hospital encounter of 06/21/16 (from the past 72 hour(s))  CBC WITH DIFFERENTIAL     Status: Abnormal  Collection Time: 06/22/16  5:50 AM   Result Value Ref Range   WBC 12.7 (H) 4.0 - 10.5 K/uL   RBC 5.24 4.22 - 5.81 MIL/uL   Hemoglobin 15.2 13.0 - 17.0 g/dL   HCT 46.7 39.0 - 52.0 %   MCV 89.1 78.0 - 100.0 fL   MCH 29.0 26.0 - 34.0 pg   MCHC 32.5 30.0 - 36.0 g/dL   RDW 14.2 11.5 - 15.5 %   Platelets 183 150 - 400 K/uL   Neutrophils Relative % 76 %   Neutro Abs 9.7 (H) 1.7 - 7.7 K/uL   Lymphocytes Relative 12 %   Lymphs Abs 1.5 0.7 - 4.0 K/uL   Monocytes Relative 11 %   Monocytes Absolute 1.3 (H) 0.1 - 1.0 K/uL   Eosinophils Relative 1 %   Eosinophils Absolute 0.1 0.0 - 0.7 K/uL   Basophils Relative 0 %   Basophils Absolute 0.0 0.0 - 0.1 K/uL  Comprehensive metabolic panel     Status: Abnormal   Collection Time: 06/22/16  5:50 AM  Result Value Ref Range   Sodium 141 135 - 145 mmol/L   Potassium 3.4 (L) 3.5 - 5.1 mmol/L   Chloride 100 (L) 101 - 111 mmol/L   CO2 25 22 - 32 mmol/L   Glucose, Bld 102 (H) 65 - 99 mg/dL   BUN 21 (H) 6 - 20 mg/dL   Creatinine, Ser 1.10 0.61 - 1.24 mg/dL   Calcium 9.0 8.9 - 10.3 mg/dL   Total Protein 6.2 (L) 6.5 - 8.1 g/dL   Albumin 3.4 (L) 3.5 - 5.0 g/dL   AST 26 15 - 41 U/L   ALT 19 17 - 63 U/L   Alkaline Phosphatase 85 38 - 126 U/L   Total Bilirubin 1.2 0.3 - 1.2 mg/dL   GFR calc non Af Amer >60 >60 mL/min   GFR calc Af Amer >60 >60 mL/min    Comment: (NOTE) The eGFR has been calculated using the CKD EPI equation. This calculation has not been validated in all clinical situations. eGFR's persistently <60 mL/min signify possible Chronic Kidney Disease.    Anion gap 16 (H) 5 - 15     HEENT: vertical diplopia in all visual fields Cardio: RRR and no murmur Resp: CTA B/L and unlabored GI: BS positive and NT, ND Extremity:  No Edema Skin:   Intact Neuro: Alert/Oriented, Normal Sensory and Other 5/5 BUE and 4/5 BLE, voice is hoarse Musc/Skel:  Normal Gen NAD   Assessment/Plan: 1. 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 Physiatrist is providing close team supervision and 24 hour management of active medical problems listed below. Physiatrist and rehab team continue to assess barriers to discharge/monitor patient progress toward functional and medical goals. FIM: Function - Bathing Position: Sitting EOB Body parts bathed by patient: Right arm, Left arm, Chest, Abdomen, Right upper leg, Left upper leg Body parts bathed by helper: Right lower leg, Left lower leg, Buttocks, Front perineal area Assist Level: Touching or steadying assistance(Pt > 75%)  Function- Upper Body Dressing/Undressing What is the patient wearing?: Pull over shirt/dress Pull over shirt/dress - Perfomed by patient: Thread/unthread right sleeve, Thread/unthread left sleeve, Put head through opening Pull over shirt/dress - Perfomed by helper: Pull shirt over trunk Assist Level: Touching or steadying assistance(Pt > 75%) Function - Lower Body Dressing/Undressing What is the patient wearing?: Pants Position: Sitting EOB Pants- Performed by patient:  Thread/unthread left pants leg, Thread/unthread right pants leg Pants- Performed by helper: Pull pants up/down Assist for footwear: Partial/moderate assist Assist for lower body dressing: Touching or steadying assistance (Pt > 75%)  Function - Toileting Toileting activity did not occur: No continent bowel/bladder event  Function - Air cabin crew transfer activity did not occur: Safety/medical concerns  Function - Chair/bed transfer Chair/bed transfer method: Squat pivot Chair/bed transfer assist level: Moderate assist (Pt 50 - 74%/lift or lower) Chair/bed transfer assistive device: Armrests, Bedrails Chair/bed transfer details: Manual facilitation for placement, Manual facilitation for weight shifting, Verbal cues for sequencing, Visual cues/gestures for precautions/safety  Function - Locomotion:  Wheelchair Type: Manual Max wheelchair distance: 150 Assist Level: Dependent (Pt equals 0%) Assist Level: Dependent (Pt equals 0%) Assist Level: Dependent (Pt equals 0%) Turns around,maneuvers to table,bed, and toilet,negotiates 3% grade,maneuvers on rugs and over doorsills: No Function - Locomotion: Ambulation Assistive device: Rail in hallway Max distance: 10 Assist level: Moderate assist (Pt 50 - 74%) Assist level: Moderate assist (Pt 50 - 74%) Walk 50 feet with 2 turns activity did not occur: Safety/medical concerns Walk 150 feet activity did not occur: Safety/medical concerns Walk 10 feet on uneven surfaces activity did not occur: Safety/medical concerns  Function - Comprehension Comprehension: Auditory Comprehension assistive device: Hearing aids Comprehension assist level: Follows basic conversation/direction with no assist  Function - Expression Expression: Verbal Expression assist level: Expresses basic needs/ideas: With no assist  Function - Social Interaction Social Interaction assist level: Interacts appropriately 90% of the time - Needs monitoring or encouragement for participation or interaction.  Function - Problem Solving Problem solving assist level: Solves basic 50 - 74% of the time/requires cueing 25 - 49% of the time  Function - Memory Memory assist level: Recognizes or recalls 75 - 89% of the time/requires cueing 10 - 24% of the time Patient normally able to recall (first 3 days only): Current season, Location of own room, Staff names and faces, That he or she is in a hospital  Medical Problem List and Plan: 1.  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  DVT Prophylaxis/Anticoagulation: Eliquis 3. Pain Management: Tylenol as needed 4. Mood: Risperdal 0.5 mg daily at bedtime, Zoloft 150 mg daily, will monitor for  adjustment 5. Neuropsych: This patient is capable of making decisions on his own behalf. 6. Skin/Wound Care: Routine skin checks 7. Fluids/Electrolytes/Nutrition: Routine I&O with follow-up chemistries 8. Dysphagia. Mechanical soft thin liquid diet. Follow-up speech therapy. Monitor for any signs of aspiration 9. Atrial fibrillation with history of pulmonary emboli. Tambocor 50 mg every 12 hours. Cardiac rate controlled. 10. OSA/CPAP. Check oxygen saturations every shift 11. Diastolic congestive heart failure. Monitor for any signs of fluid overload. Lasix 40 mg daily 12. Hypertension: Monitor BP 13. Morbid obesity. BMI 48.56. Dietary follow-up, slow wt loss rec 14. BPH. Proscar 5 mg daily. Check PVRs 3 15. Hyperlipidemia. Lipitor 16. History of bilateral TKA. Patient used a Programmer, multimedia prior to admission 17.  Constipation- discussed with RN 18.  Urinary retention BVI per RN, trial flomax LOS (Days) 2 A FACE TO FACE EVALUATION WAS PERFORMED  Destinee Taber E 06/23/2016, 7:25 AM

## 2016-06-24 ENCOUNTER — Inpatient Hospital Stay (HOSPITAL_COMMUNITY): Payer: Medicare Other | Admitting: Speech Pathology

## 2016-06-24 ENCOUNTER — Inpatient Hospital Stay (HOSPITAL_COMMUNITY): Payer: Medicare Other | Admitting: Occupational Therapy

## 2016-06-24 ENCOUNTER — Inpatient Hospital Stay (HOSPITAL_COMMUNITY): Payer: Self-pay | Admitting: Physical Therapy

## 2016-06-24 DIAGNOSIS — K5901 Slow transit constipation: Secondary | ICD-10-CM

## 2016-06-24 MED ORDER — ROPINIROLE HCL 0.5 MG PO TABS
0.5000 mg | ORAL_TABLET | Freq: Every day | ORAL | Status: DC
  Administered 2016-06-24 – 2016-07-11 (×18): 0.5 mg via ORAL
  Filled 2016-06-24 (×18): qty 1

## 2016-06-24 MED ORDER — SORBITOL 70 % SOLN
60.0000 mL | Status: AC
  Administered 2016-06-24: 60 mL via ORAL
  Filled 2016-06-24: qty 60

## 2016-06-24 NOTE — IPOC Note (Signed)
Overall Plan of Care [facility]) Patient Details Name: [name] MRN: [id number] DOB: April 17, 1945  Admitting Diagnosis: L CVA  Hospital Problems: Active Problems:   Embolic cerebral infarction [facility])     Functional Problem List: Nursing Bowel, Edema, Endurance, Medication Management, Motor, Perception, Nutrition, Skin Integrity, Safety  PT Balance, Safety, Sensory, Endurance, Motor, Perception  OT Balance, Endurance, Motor, Vision  SLP Linguistic, Nutrition  TR         Basic ADL's: OT Grooming, Dressing, Bathing, Toileting     Advanced  ADL's: OT       Transfers: PT Bed Mobility, Bed to Chair, Car, Manufacturing systems engineer, Metallurgist: PT Emergency planning/management officer, Ambulation, Stairs     Additional Impairments: OT None  SLP Swallowing, Communication expression    TR      Anticipated Outcomes Item Anticipated Outcome  Self Feeding    Swallowing  Mod I   Basic self-care  Mod I  Toileting  Mod I   Bathroom Transfers Mod I  Bowel/Bladder  Min assist  Transfers  mod I   Locomotion  supervision ambulatory, mod I w/c   Communication  Mod I   Cognition     Pain  < 3  Safety/Judgment  Mod I assist   Therapy Plan: PT Intensity: Minimum of 1-2 x/day ,45 to 90 minutes PT Frequency: 5 out of 7 days PT Duration Estimated Length of Stay: 12-14 days OT Intensity: Minimum of 1-2 x/day, 45 to 90 minutes OT Frequency: 5 out of 7 days OT Duration/Estimated Length of Stay: 12-14 days SLP Intensity: Minumum of 1-2 x/day, 30 to 90 minutes SLP Frequency: 3 to 5 out of 7 days SLP Duration/Estimated Length of Stay: 10-12 days        Team Interventions: Nursing Interventions Patient/Family Education, Disease Management/Prevention, Skin Care/Wound Management, Cognitive Remediation/Compensation, Bowel Management, Medication Management, Dysphagia/Aspiration Precaution Training  PT interventions Ambulation/gait training, Community reintegration, DME/adaptive  equipment instruction, Neuromuscular re-education, Psychosocial support, Stair training, UE/LE Strength taining/ROM, Wheelchair propulsion/positioning, UE/LE Coordination activities, Therapeutic Activities, Functional electrical stimulation, Discharge planning, Training and development officer, Cognitive remediation/compensation, Functional mobility training, Patient/family education, Splinting/orthotics, Therapeutic Exercise, Visual/perceptual remediation/compensation  OT Interventions Training and development officer, Academic librarian, Discharge planning, DME/adaptive equipment instruction, Functional mobility training, Patient/family education, Self Care/advanced ADL retraining, Therapeutic Activities, UE/LE Strength taining/ROM, UE/LE Coordination activities, Visual/perceptual remediation/compensation  SLP Interventions Dysphagia/aspiration precaution training, Speech/Language facilitation, Therapeutic Exercise, Patient/family education, Therapeutic Activities  TR Interventions    SW/CM Interventions Discharge Planning, Psychosocial Support, Patient/Family Education    Team Discharge Planning: Destination: PT-Home ,OT- Home , SLP-Home Projected Follow-up: PT-Home health PT, 24 hour supervision/assistance, OT-  Home health OT, SLP-Outpatient SLP Projected Equipment Needs: PT-To be determined, OT- Tub/shower bench, To be determined, SLP-To be determined Equipment Details: PT- , OT-  Patient/family involved in discharge planning: PT- Patient, Family member/caregiver,  OT-Patient, Family member/caregiver, SLP-Patient  MD ELOS: 12-14 days Medical Rehab Prognosis:  Excellent Assessment: The patient has been admitted for CIR therapies with the diagnosis of left CVA. The team will be addressing functional mobility, strength, stamina, balance, safety, adaptive techniques and equipment, self-care, bowel and bladder mgt, patient and caregiver education, NMR, communication/language/cognition, visual-spatial  awareness, community reintegration, ego support. Goals have been set at mod I for mobility, self-care and, cognition/safety awareness.   Meredith Staggers, MD, FAAPMR      See Team Conference Notes for weekly updates to the plan of care

## 2016-06-24 NOTE — Progress Notes (Addendum)
Social Work Assessment and Plan  Patient Details  Name: [name] MRN: [id number] Date of Birth: Jul 22, 1945  Today's Date: 06/23/2016  Problem List:  Patient Active Problem List   Diagnosis Date Noted  . Embolic cerebral infarction ([place]) 06/21/2016  . Stroke syndrome ([place])   . OSA (obstructive sleep apnea)   . Hyperlipidemia   . Left-sided weakness   . History of pulmonary embolism   . History of total knee arthroplasty, bilateral   . Dysphagia, post-stroke   . Hypokalemia   . Ataxia, post-stroke   . Neurologic gait disorder   . Headache 06/19/2016  . Claudication ([place]) 04/28/2016  . Encounter for monitoring flecainide therapy 04/28/2016  . Shortness of breath 04/28/2016  . Pure hyperglyceridemia 04/26/2016  . Skin lesion of chest wall 04/26/2016  . Recurrent pulmonary emboli ([place]) 06/13/2015  . PE (pulmonary embolism) 06/13/2015  . GERD (gastroesophageal reflux disease) 06/13/2015  . Asthma 06/13/2015  . Basal cell carcinoma of skin 06/28/2014  . 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 Sinus bradycardia 12/15/2013  . Abnormal serum level of alkaline phosphatase 08/01/2013  . PAF (paroxysmal atrial fibrillation) ([place]) 07/29/2013  . Chronic anticoagulation 07/22/2013  . Gastric ulcer with hemorrhage 07/20/13 07/22/2013  . Pulmonary thromboembolism- 07/16/13 07/16/2013  . Pericardial effusion s/p pericardial window 07/18/13 400cc 07/16/2013  . Diastolic congestive heart failure ([place]) 07/16/2013  . H/O laparoscopic adjustable gastric banding & hiatal hernia repair 04/08/13 04/23/2013  . Prediabetes 02/05/2013  . TMJ (temporomandibular joint syndrome) 09/11/2012  . Morbid obesity ([place]) 08/09/2012  . Peripheral neuropathy ([place]) 11/02/2009  . Hyperlipidemia with target LDL less than 130 09/10/2008  . Depression 09/10/2008  . Obstructive sleep apnea 09/10/2008  . GOUT 06/28/2006  . HTN (hypertension) 06/28/2006  . BPH (benign prostatic hyperplasia)  06/28/2006   Past Medical History:  Past Medical History:  Diagnosis Date  . Anemia 07/29/2013  . Asthma    childhood asthma  . Benign prostatic hypertrophy    several prostate biopsies neg for cancer.   . Bilateral hip pain   . Cancer ([place])   . CHF (congestive heart failure) ([place])   . Complication of anesthesia    MORPHINE CAUSES HALLUCINATIONS  . CVA (cerebral vascular accident) ([place]) 06/2016  . Depression   . FH: colonic polyps 2010  . Gout   . History of kidney stones   . Hypertension   . Kidney cysts   . Morbid obesity ([place])   . Numbness    feet / legs  . PAF (paroxysmal atrial fibrillation) with RVR 07/29/2013  . Sleep apnea    USES C PAP   Past Surgical History:  Past Surgical History:  Procedure Laterality Date  . ARTHROSCOPY KNEE W/ DRILLING    . BREATH TEK H PYLORI N/A 12/23/2012   Procedure: BREATH TEK H PYLORI;  Surgeon: Gayland Curry, MD;  Location: [facility];  Service: General;  Laterality: N/A;  . CHOLECYSTECTOMY  1989  . COLONOSCOPY N/A 07/31/2013   Procedure: COLONOSCOPY;  Surgeon: Gatha Mayer, MD;  Location: [facility];  Service: Endoscopy;  Laterality: N/A;  . ESOPHAGOGASTRODUODENOSCOPY N/A 07/22/2013   Procedure: ESOPHAGOGASTRODUODENOSCOPY (EGD);  Surgeon: Irene Shipper, MD;  Location: [facility] END[facility]COPY;  Service: Endoscopy;  Laterality: N/A;  . ESOPHAGOGASTRODUODENOSCOPY N/A 07/31/2013   Procedure: ESOPHAGOGASTRODUODENOSCOPY (EGD);  Surgeon: Gatha Mayer, MD;  Location: [facility] ENDOSCOPY;  Service: Endoscopy;  Laterality: N/A;  . INTRAOPERATIVE TRANSESOPHAGEAL ECHOCARDIOGRAM N/A 07/18/2013   Procedure: INTRAOPERATIVE TRANSESOPHAGEAL ECHOCARDIOGRAM;  Surgeon: Percell Miller  Maryruth Bun, MD;  Location: [facility];  Service: Open Heart Surgery;  Laterality: N/A;  . LAPAROSCOPIC GASTRIC BANDING WITH HIATAL HERNIA REPAIR N/A 04/08/2013   Procedure: LAPAROSCOPIC GASTRIC BANDING WITH possible  HIATAL HERNIA REPAIR;  Surgeon: Gayland Curry, MD;  Location: [facility];  Service: General;   Laterality: N/A;  . LITHOTRIPSY    . renal calculi    . SUBXYPHOID PERICARDIAL WINDOW N/A 07/18/2013   Procedure: SUBXYPHOID PERICARDIAL WINDOW;  Surgeon: Grace Isaac, MD;  Location: [facility];  Service: Thoracic;  Laterality: N/A;  . TONSILLECTOMY    . total knee replacment     bilat  . trigger finger repair     also lue, cts surgically repaired  . WISDOM TOOTH EXTRACTION     Social History:  reports that he quit smoking about 41 years ago. His smoking use included Cigarettes. He started smoking about 55 years ago. He has a 2.60 pack-year smoking history. He has never used smokeless tobacco. He reports that he drinks alcohol. He reports that he does not use drugs.  Family / Support Systems Marital Status: Married How Long?: 40+ years Patient Roles: Spouse, Parent, Other (Comment) (grandparent) Spouse/Significant Other: Shaune Westfall - wife - [phone] (h); [phone] (m) Children: Bracen Schum - son - [phone] Other Supports: friends; neighbors Anticipated Caregiver: [name] Ability/Limitations of Caregiver: Supervision only  Caregiver Availability: 24/7 Family Dynamics: support, upbeat wife; successful, supportive son  Social History Preferred language: English Religion: Protestant Education: Forensic psychologist - Orthoptist - history and Conservation officer, nature Read: Yes Write: Yes 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   Abuse/Neglect Physical Abuse: Denies Verbal Abuse: Denies Sexual Abuse: Denies Exploitation of patient/patient's resources: Denies Self-Neglect: Denies  Emotional Status Pt's affect, behavior and adjustment status: Pt is motivated and ready to rehabilitate.  He prides himself on being able to adapt to change. Recent Psychosocial Issues: Pt had  a rough couple of years with medical issues.  He's hoping for better days.  Psychiatric History: Pt did not report any hx, but depression is listed on his chart and he takes risperdal and zoloft. Substance Abuse History: none reported  Patient / Family Perceptions, Expectations & Goals Pt/Family understanding of illness & functional limitations: Pt reports a good understanding of his medical condition and feels his questions have been answered.  CSW will assess how wife feels about this when we meet. Premorbid pt/family roles/activities: Pt is enjoying being a grandparent to his 1 month old granddtr.  He is also a Probation officer. Anticipated changes in roles/activities/participation: Pt is looking forward to returning to these activities/roles as soon as he is able. Pt/family expectations/goals: Pt wants to "walk out of here."  Would like to rehabilitate and to return to writing eventually.  Community [employer] Agencies: None Premorbid Home Care/DME Agencies: Other (Comment) (Pt has a rolling walker and cane at home from his knee surgery.  Agency unknown.) Transportation available at discharge: wife; son Resource referrals recommended: Support group (specify) [facility] Stroke Support Group)  Discharge Planning Living Arrangements: Spouse/significant other Support Systems: Spouse/significant other, Children, Other relatives, Friends/neighbors Type of Residence: Private residence Insurance Resources: [employer], Multimedia programmer (specify) ([place]) Museum/gallery curator Resources: Radio broadcast assistant Screen Referred: No Money Management: Patient Does the patient have any problems obtaining your medications?: No Home Management: Pt and wife usually  do this together. Patient/Family Preliminary Plans: Pt plans to return home to his home where wife can only provide supervision.  Pt's son can assist with stairs. Barriers to Discharge: Steps Social Work Anticipated Follow Up Needs:  HH/OP, Support Group Expected length of stay: 12 to 14 days  Clinical Impression CSW met with pt to introduce self and role of CSW, as well as to complete assessment.  Pt's wife was not present, but CSW will meet her when she visits.  Pt reports that his wife is even more positive and upbeat than he is.  Pt is able to adapt to ever changing life and this helps him to cope with difficult situations, like the medical issues he's experienced these last few years.  Pt reports good support and is motivated to get better and to get back to previous activities.  Pt's wife has some medical issues of her own and she can only provide supervision to pt, but most of pt's goals are supervision or mod I with only minimal assistance for stairs.  CSW looks forward to meeting pt's wife and following pt to provide assistance as needed.  Dmarion Perfect, Silvestre Mesi 06/24/2016, 10:27 PM

## 2016-06-24 NOTE — Progress Notes (Signed)
Subjective/Complaints: Still no BM recorded. Slept well. Using cpap  ROS: pt denies nausea, vomiting, diarrhea, cough, shortness of breath or chest pain  Objective: Vital Signs: Blood pressure (!) 152/77, pulse 76, temperature 97.7 F (36.5 C), temperature source Axillary, resp. rate 19, height 6' (1.829 m), weight (!) 151.7 kg (334 lb 6.4 oz), SpO2 96 %. No results found. Results for orders placed or performed during the hospital encounter of 06/21/16 (from the past 72 hour(s))  CBC WITH DIFFERENTIAL     Status: Abnormal   Collection Time: 06/22/16  5:50 AM  Result Value Ref Range   WBC 12.7 (H) 4.0 - 10.5 K/uL   RBC 5.24 4.22 - 5.81 MIL/uL   Hemoglobin 15.2 13.0 - 17.0 g/dL   HCT 46.7 39.0 - 52.0 %   MCV 89.1 78.0 - 100.0 fL   MCH 29.0 26.0 - 34.0 pg   MCHC 32.5 30.0 - 36.0 g/dL   RDW 14.2 11.5 - 15.5 %   Platelets 183 150 - 400 K/uL   Neutrophils Relative % 76 %   Neutro Abs 9.7 (H) 1.7 - 7.7 K/uL   Lymphocytes Relative 12 %   Lymphs Abs 1.5 0.7 - 4.0 K/uL   Monocytes Relative 11 %   Monocytes Absolute 1.3 (H) 0.1 - 1.0 K/uL   Eosinophils Relative 1 %   Eosinophils Absolute 0.1 0.0 - 0.7 K/uL   Basophils Relative 0 %   Basophils Absolute 0.0 0.0 - 0.1 K/uL  Comprehensive metabolic panel     Status: Abnormal   Collection Time: 06/22/16  5:50 AM  Result Value Ref Range   Sodium 141 135 - 145 mmol/L   Potassium 3.4 (L) 3.5 - 5.1 mmol/L   Chloride 100 (L) 101 - 111 mmol/L   CO2 25 22 - 32 mmol/L   Glucose, Bld 102 (H) 65 - 99 mg/dL   BUN 21 (H) 6 - 20 mg/dL   Creatinine, Ser 1.10 0.61 - 1.24 mg/dL   Calcium 9.0 8.9 - 10.3 mg/dL   Total Protein 6.2 (L) 6.5 - 8.1 g/dL   Albumin 3.4 (L) 3.5 - 5.0 g/dL   AST 26 15 - 41 U/L   ALT 19 17 - 63 U/L   Alkaline Phosphatase 85 38 - 126 U/L   Total Bilirubin 1.2 0.3 - 1.2 mg/dL   GFR calc non Af Amer >60 >60 mL/min   GFR calc Af Amer >60 >60 mL/min    Comment: (NOTE) The eGFR has been calculated using the CKD EPI  equation. This calculation has not been validated in all clinical situations. eGFR's persistently <60 mL/min signify possible Chronic Kidney Disease.    Anion gap 16 (H) 5 - 15     HEENT: vertical diplopia in all visual fields Cardio: RRR Resp: CTA B/L GI: ND, BS+ Extremity:  No Edema Skin:   Intact Neuro: Alert/Oriented, Normal Sensory and Other 5/5 BUE and 4/5 BLE, voice is hoarse Musc/Skel:  Normal Gen NAD   Assessment/Plan: 1. 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 Physiatrist is providing close team supervision and 24 hour management of active medical problems listed below. Physiatrist and rehab team continue to assess barriers to discharge/monitor patient progress toward functional and medical goals. FIM: Function - Bathing Position: Wheelchair/chair at sink Body parts bathed by patient: Right arm, Left arm, Chest, Abdomen Body parts bathed by helper: Front perineal area, Buttocks, Right upper leg, Left  upper leg, Right lower leg, Left lower leg Assist Level: Touching or steadying assistance(Pt > 75%)  Function- Upper Body Dressing/Undressing What is the patient wearing?: Pull over shirt/dress Pull over shirt/dress - Perfomed by patient: Thread/unthread right sleeve, Thread/unthread left sleeve Pull over shirt/dress - Perfomed by helper: Put head through opening, Pull shirt over trunk Assist Level: Touching or steadying assistance(Pt > 75%) Function - Lower Body Dressing/Undressing What is the patient wearing?: Pants, Underwear Position: Wheelchair/chair at sink Underwear - Performed by helper: Thread/unthread left underwear leg, Thread/unthread right underwear leg, Pull underwear up/down Pants- Performed by patient: Thread/unthread left pants leg, Thread/unthread right pants leg Pants- Performed by helper: Thread/unthread left pants leg,  Thread/unthread right pants leg, Pull pants up/down Assist for footwear: Dependant Assist for lower body dressing: 2 Helpers  Function - Toileting Toileting activity did not occur: No continent bowel/bladder event  Function - Air cabin crew transfer activity did not occur: Safety/medical concerns Toilet transfer assistive device: Drop arm commode Assist level to toilet: Moderate assist (Pt 50 - 74%/lift or lower) Assist level from toilet: Moderate assist (Pt 50 - 74%/lift or lower)  Function - Chair/bed transfer Chair/bed transfer method: Squat pivot Chair/bed transfer assist level: Moderate assist (Pt 50 - 74%/lift or lower) Chair/bed transfer assistive device: Armrests Chair/bed transfer details: Manual facilitation for placement, Manual facilitation for weight shifting, Verbal cues for sequencing, Visual cues/gestures for precautions/safety  Function - Locomotion: Wheelchair Type: Manual Max wheelchair distance: 150 Assist Level: Supervision or verbal cues Assist Level: Supervision or verbal cues Assist Level: Supervision or verbal cues Turns around,maneuvers to table,bed, and toilet,negotiates 3% grade,maneuvers on rugs and over doorsills: No Function - Locomotion: Ambulation Assistive device: Rail in hallway Max distance: 10 Assist level: Moderate assist (Pt 50 - 74%) Assist level: Moderate assist (Pt 50 - 74%) Walk 50 feet with 2 turns activity did not occur: Safety/medical concerns Walk 150 feet activity did not occur: Safety/medical concerns Walk 10 feet on uneven surfaces activity did not occur: Safety/medical concerns  Function - Comprehension Comprehension: Auditory Comprehension assistive device: Hearing aids Comprehension assist level: Follows basic conversation/direction with no assist  Function - Expression Expression: Verbal Expression assist level: Expresses basic needs/ideas: With no assist  Function - Social Interaction Social Interaction  assist level: Interacts appropriately 90% of the time - Needs monitoring or encouragement for participation or interaction.  Function - Problem Solving Problem solving assist level: Solves basic problems with no assist  Function - Memory Memory assist level: More than reasonable amount of time Patient normally able to recall (first 3 days only): Current season, Location of own room, Staff names and faces, That he or she is in a hospital  Medical Problem List and Plan: 1.  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  DVT Prophylaxis/Anticoagulation: Eliquis 3. Pain Management: Tylenol as needed 4. Mood: Risperdal 0.5 mg daily at bedtime, Zoloft 150 mg daily--stable at present 5. Neuropsych: This patient is capable of making decisions on his own behalf. 6. Skin/Wound Care: Routine skin checks 7. Fluids/Electrolytes/Nutrition: Routine I&O with follow-up chemistries 8. Dysphagia. Mechanical soft thin liquid diet. Follow-up speech therapy. Monitor for any signs of aspiration 9. Atrial fibrillation with history of pulmonary emboli. Tambocor 50 mg every 12 hours. Cardiac rate controlled. 10. OSA/CPAP. Check oxygen saturations every shift 11. Diastolic congestive heart failure. Monitor for any signs of fluid overload.  Lasix 40 mg daily  -needs regular weights 12. Hypertension: Monitor BP 13. Morbid obesity. BMI 48.56. Dietary follow-up, slow wt loss rec 14. BPH. Proscar 5 mg daily.   -emptying with low pvr's    15. Hyperlipidemia. Lipitor 16. History of bilateral TKA. Patient used a Programmer, multimedia prior to admission 17.  Constipation-  Needs bm today---none recorded since 3/19   -sorbitol and sse   LOS (Days) 3 A FACE TO FACE EVALUATION WAS PERFORMED  , T 06/24/2016, 7:40 AM

## 2016-06-24 NOTE — Progress Notes (Signed)
Patient states he places self on and off of CPAP when ready. RT informed patient if he has any trouble have RN contact RT. 

## 2016-06-24 NOTE — Progress Notes (Signed)
Physical Therapy Session Note  Patient Details  Name: [name] MRN: [id number] Date of Birth: 05-14-1945  Today's Date: 06/24/2016 PT Individual Time: 1345-1415 PT Individual Time Calculation (min): 30 min    Skilled Therapeutic Interventions/Progress Updates:    Session 1:  2563 to 1210:  At request of primary PT, pt screened vestibular.  Pt demonstrates questionable skew deviation and wears patch to assist in decreasing diplopia.  No nystagmus noted with gaze or spontaneously.  Modified DHP to the left negative with bed in trendelenburg as pt reports sometimes he feels dizzy when rolling to the left side.  Dynamic visual acuity showed 8 line difference.  Attempted active eye head movements and slow VOR x 1, however, pt had difficulty coordinating movements.  Due to diplopia and patching, recommend pt sees ocular specialist prior to initiating significant vestibular exercises.  During this session, pt additionally worked on transfer to wheelchair to min assist and propelling wheelchair x approx 100 ft with supervision.  Rates mid back pain 6/10.  States providers aware.  Session 2:  1345-1415:  Session initiated with pt happy that he had been able to have a bowel movement.  Pt reports being very fatigued this afternoon.  Worked on improving mobility and endurance with wheelchair propulsion, transfers, and supine diaphragmatic breath with transverse abdominis contraction to improve core control to decrease upper back pain during activities.  Denies back pain.  Pt was returned to bed with alarm set and wife in room.  Due to intermitten dizziness, vitals taken:  BP:  129/pulse; HR: 69 bpm; Sp02:  97%.    Therapy Documentation Precautions:  Precautions Precautions: Fall Precaution Comments: dizzy, poor postural control Restrictions Weight Bearing Restrictions: No     See Function Navigator for Current Functional Status.   Therapy/Group: Individual Therapy  Nicha Hemann Hilario Quarry 06/24/2016, 4:13 PM

## 2016-06-24 NOTE — Progress Notes (Signed)
Speech Language Pathology Daily Session Note  Patient Details  Name: [name] MRN: [id number] Date of Birth: 1945/11/27  Today's Date: 06/24/2016 SLP Individual Time: 1100-1130 SLP Individual Time Calculation (min): 30 min  Short Term Goals: Week 1: SLP Short Term Goal 1 (Week 1): Patient will consume current diet with minimal overt s/s of aspiration with Mod I.  SLP Short Term Goal 2 (Week 1): Patient will consume trials of Dys. 3 textures without overt s/s of aspiration or reports of pharyngeal residuals over 2 sessions with supervision verbal cues prior to upgrde.   Skilled Therapeutic Interventions: Skilled treatment session focused on dysphagia goals. SLP facilitated session by providing skilled observation of pt consuming dry graham crackers, diced peaches and thin water via cup sips. Pt with 1 cough on thin water suggestive of delayed swallow initiation. Pt excited that his swallow "felt good enough to eat of it." Pt left upright in bed with nursing present. Continue per current plan of care.       Function:  Eating Eating   Modified Consistency Diet: No Eating Assist Level: Swallowing techniques: self managed           Cognition Comprehension Comprehension assist level: Follows basic conversation/direction with no assist  Expression   Expression assist level: Expresses basic needs/ideas: With no assist  Social Interaction Social Interaction assist level: Interacts appropriately with others with medication or extra time (anti-anxiety, antidepressant).  Problem Solving Problem solving assist level: Solves basic problems with no assist  Memory Memory assist level: [name] than reasonable amount of time    Pain Pain Assessment Pain Assessment: 0-10 Pain Score: 0-No pain  Therapy/Group: Individual Therapy   Genaro Bekker B. Rutherford Nail, M.S., [place] 06/24/2016, 11:30 AM

## 2016-06-24 NOTE — Progress Notes (Signed)
Occupational Therapy Session Note  Patient Details  Name: [name] MRN: [id number] Date of Birth: Aug 11, 1945  Today's Date: 06/24/2016 OT Individual Time:  - 0800-0900  (60 min)   1st session                                        1300-1345  (45 min)   2nd session      Short Term Goals: Week 1:  OT Short Term Goal 1 (Week 1): Pt will maintain sitting balance while performing ADL task with supervision OT Short Term Goal 2 (Week 1): Pt will complete toilet transfer with min A OT Short Term Goal 3 (Week 1): Pt will tolerate standing at the sink for 2 grooming tasks with min A for balance Week 2:     Skilled Therapeutic Interventions/Progress Updates:   1st session:   Pt lying in bed with hick ups.  Provided strategies for breathing to help relax abdominal region.  Pt went from supine with HOB at 60 degrees to sitting EOB using bed rail with SBA.  OT donned footies for pt stated if he tried he might throw up.  Sat EOB and pt seemed better with no more hick ups.  Practiced scooting to right and left.  PPt transferred to wc with mod assist.  Performed grooming at sink  Stood for 2.30 mins at sink with hands support and 3-4 seconds without hand support.  Transferred to bed with min assist with scoot pivot transfer.    2nd session:  Pt transferred to toilet with 3n1 over toilet with mod assist.  Pt continent of Bowel.  Performed sit to stand x4 for peri clean with min assist for sit to stand and mod assist for standing balance using grab bars.  Pt stood for 30-40- seconds x4.   Pt transferred  From 3n 1 to wc with mod assist.   Left in wc with wife in room.     Therapy Documentation Precautions:  Precautions Precautions: Fall Precaution Comments: dizzy, poor postural control Restrictions Weight Bearing Restrictions: No General:   Vital Signs: Therapy Vitals Temp: 97.7 F (36.5 C) Temp Source: Axillary Pulse Rate: 76 Resp: 19 BP: (!) 152/77 Patient Position (if appropriate):  Lying Oxygen Therapy SpO2: 96 % O2 Device: CPAP Pain:  none ADL: ADL ADL Comments: Please see functional navigator Exercises: Educated pt when lying in bed; remove eye patch and try to find an object in room that was not doubled without eye patch and focus on it.  Then move eyes slowly to one side and see if he can train eyes for single vision.     Other Treatments:    See Function Navigator for Current Functional Status.   Therapy/Group: Individual Therapy  Lisa Roca 06/24/2016, 7:54 AM

## 2016-06-25 ENCOUNTER — Inpatient Hospital Stay (HOSPITAL_COMMUNITY): Payer: Self-pay | Admitting: Physical Therapy

## 2016-06-25 DIAGNOSIS — R1312 Dysphagia, oropharyngeal phase: Secondary | ICD-10-CM

## 2016-06-25 MED ORDER — TRAMADOL HCL 50 MG PO TABS
50.0000 mg | ORAL_TABLET | Freq: Three times a day (TID) | ORAL | Status: DC | PRN
  Administered 2016-06-25 – 2016-07-01 (×4): 50 mg via ORAL
  Filled 2016-06-25 (×5): qty 1

## 2016-06-25 MED ORDER — POTASSIUM CHLORIDE 20 MEQ PO PACK
20.0000 meq | PACK | Freq: Every day | ORAL | Status: DC
  Administered 2016-06-26 – 2016-07-09 (×13): 20 meq via ORAL
  Filled 2016-06-25 (×15): qty 1

## 2016-06-25 NOTE — Progress Notes (Signed)
Pt has home unit and places self on/off cpap. Rt will monitor.

## 2016-06-25 NOTE — Progress Notes (Signed)
Subjective/Complaints: Moved bowels yesterday. No new complaints. Slept well. Clearing throat after swallowing solids  ROS: pt denies nausea, vomiting, diarrhea, cough, shortness of breath or chest pain   Objective: Vital Signs: Blood pressure (!) 144/71, pulse (!) 55, temperature 97.7 F (36.5 C), temperature source Axillary, resp. rate 18, height 6' (1.829 m), weight (!) 153.2 kg (337 lb 11.2 oz), SpO2 95 %. No results found. No results found for this or any previous visit (from the past 72 hour(s)).   HEENT: vertical diplopia in all visual fields Cardio: RRR Resp: CTA B/L GI: ND, BS+ Extremity:  No Edema Skin:   Intact Neuro: Alert/Oriented, Normal Sensory and Other 5/5 BUE and 4/5 BLE, voice is hoarse esp after swallowing solids Musc/Skel:  Normal Gen NAD, obese   Assessment/Plan: 1. 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 Physiatrist is providing close team supervision and 24 hour management of active medical problems listed below. Physiatrist and rehab team continue to assess barriers to discharge/monitor patient progress toward functional and medical goals. FIM: Function - Bathing Position: Wheelchair/chair at sink Body parts bathed by patient: Right arm, Left arm, Chest, Abdomen Body parts bathed by helper: Front perineal area, Buttocks, Right upper leg, Left upper leg, Right lower leg, Left lower leg Assist Level: Touching or steadying assistance(Pt > 75%)  Function- Upper Body Dressing/Undressing What is the patient wearing?: Pull over shirt/dress Pull over shirt/dress - Perfomed by patient: Thread/unthread right sleeve, Thread/unthread left sleeve Pull over shirt/dress - Perfomed by helper: Put head through opening, Pull shirt over trunk Assist Level: Touching or steadying assistance(Pt > 75%) Function - Lower Body Dressing/Undressing What is  the patient wearing?: Pants, Underwear Position: Wheelchair/chair at sink Underwear - Performed by helper: Thread/unthread left underwear leg, Thread/unthread right underwear leg, Pull underwear up/down Pants- Performed by patient: Thread/unthread left pants leg, Thread/unthread right pants leg Pants- Performed by helper: Thread/unthread left pants leg, Thread/unthread right pants leg, Pull pants up/down Assist for footwear: Dependant Assist for lower body dressing: 2 Helpers  Function - Toileting Toileting activity did not occur: No continent bowel/bladder event  Function - Air cabin crew transfer activity did not occur: Safety/medical concerns Toilet transfer assistive device: Elevated toilet seat/BSC over toilet, Grab bar Assist level to toilet: Moderate assist (Pt 50 - 74%/lift or lower) Assist level from toilet: Moderate assist (Pt 50 - 74%/lift or lower)  Function - Chair/bed transfer Chair/bed transfer method: Squat pivot Chair/bed transfer assist level: Touching or steadying assistance (Pt > 75%) Chair/bed transfer assistive device: Armrests Chair/bed transfer details: Verbal cues for precautions/safety  Function - Locomotion: Wheelchair Will patient use wheelchair at discharge?: Yes Type: Manual Max wheelchair distance:  (150 ft) Assist Level: Supervision or verbal cues Assist Level: Supervision or verbal cues Assist Level: Supervision or verbal cues Turns around,maneuvers to table,bed, and toilet,negotiates 3% grade,maneuvers on rugs and over doorsills: No Function - Locomotion: Ambulation Assistive device: Rail in hallway Max distance: 10 Assist level: Moderate assist (Pt 50 - 74%) Assist level: Moderate assist (Pt 50 - 74%) Walk 50 feet with 2 turns activity did not occur: Safety/medical concerns Walk 150 feet activity did not occur: Safety/medical concerns Walk 10 feet on uneven surfaces activity did not occur: Safety/medical concerns  Function -  Comprehension Comprehension: Auditory Comprehension assistive device: Hearing aids Comprehension assist level: Follows basic conversation/direction with no assist  Function - Expression Expression: Verbal Expression assist level: Expresses basic needs/ideas: With  no assist  Function - Social Interaction Social Interaction assist level: Interacts appropriately 90% of the time - Needs monitoring or encouragement for participation or interaction.  Function - Problem Solving Problem solving assist level: Solves basic problems with no assist  Function - Memory Memory assist level: More than reasonable amount of time Patient normally able to recall (first 3 days only): Current season, Location of own room, Staff names and faces, That he or she is in a hospital  Medical Problem List and Plan: 1.  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  DVT Prophylaxis/Anticoagulation: Eliquis 3. Pain Management: Tylenol as needed 4. Mood: Risperdal 0.5 mg daily at bedtime, Zoloft 150 mg daily--stable at present 5. Neuropsych: This patient is capable of making decisions on his own behalf. 6. Skin/Wound Care: Routine skin checks 7. Fluids/Electrolytes/Nutrition: Routine I&O with follow-up chemistries 8. Dysphagia. Mechanical soft thin liquid diet. Follow-up speech therapy. Monitor for any signs of aspiration 9. Atrial fibrillation with history of pulmonary emboli. Tambocor 50 mg every 12 hours. Cardiac rate controlled. 10. OSA/CPAP. Check oxygen saturations every shift 11. Diastolic congestive heart failure. Monitor for any signs of fluid overload. Lasix 40 mg daily  -needs regular weights 12. Hypertension: Monitor BP 13. Morbid obesity. BMI 48.56. Dietary follow-up, slow wt loss rec 14. BPH. Proscar 5 mg  daily.   -emptying with low pvr's    15. Hyperlipidemia. Lipitor 16. History of bilateral TKA. Patient used a Programmer, multimedia prior to admission 17.  Constipation- multiple large bm's yesterday after intervention   -senokot -s bid  -sorbitol prn   LOS (Days) 4 A FACE TO FACE EVALUATION WAS PERFORMED  Veronique Warga T 06/25/2016, 8:29 AM

## 2016-06-26 ENCOUNTER — Inpatient Hospital Stay (HOSPITAL_COMMUNITY): Payer: Self-pay | Admitting: Physical Therapy

## 2016-06-26 ENCOUNTER — Inpatient Hospital Stay (HOSPITAL_COMMUNITY): Payer: Self-pay | Admitting: Occupational Therapy

## 2016-06-26 ENCOUNTER — Inpatient Hospital Stay (HOSPITAL_COMMUNITY): Payer: Self-pay | Admitting: Speech Pathology

## 2016-06-26 DIAGNOSIS — R208 Other disturbances of skin sensation: Secondary | ICD-10-CM

## 2016-06-26 DIAGNOSIS — I6349 Cerebral infarction due to embolism of other cerebral artery: Secondary | ICD-10-CM

## 2016-06-26 MED ORDER — BISACODYL 5 MG PO TBEC
5.0000 mg | DELAYED_RELEASE_TABLET | ORAL | Status: DC
  Administered 2016-06-26 – 2016-07-12 (×9): 5 mg via ORAL
  Filled 2016-06-26 (×9): qty 1

## 2016-06-26 NOTE — Progress Notes (Addendum)
Occupational Therapy Session Note  Patient Details  Name: [name] MRN: [id number] Date of Birth: 1946-02-02  Today's Date: 06/26/2016 OT Individual Time: 1001-1100 OT Individual Time Calculation (min): 59 min    Short Term Goals: Week 1:  OT Short Term Goal 1 (Week 1): Pt will maintain sitting balance while performing ADL task with supervision OT Short Term Goal 2 (Week 1): Pt will complete toilet transfer with min A OT Short Term Goal 3 (Week 1): Pt will tolerate standing at the sink for 2 grooming tasks with min A for balance  Skilled Therapeutic Interventions/Progress Updates:    OT treatment session focused on modified bathing/dressing, functional transfers, sitting balance, and NMR. Stand-pivot w/c>tub bench with Mod A and cues for weight shifting and hand placement. Long-handled sponge provided to improve access to wash LB. Facilitated weight shift to R with L hand on R knee. Grab bars used to steady pt with Mod A sit<>stand in shower. Assistance to wash buttocks and back 2/2 unsafe to reach 2/2 balance. LB and UB dressing at the sink using long-handled AE to avoid bending which increases dizziness. Focused on standing balance with alternating UE support to pull pants over hips-overall min/mod A to maintain standing balance. Grooming tasks (flossing, brushing teeth, shaving) completed seated in wc at the sink with focus on B UE coordination and maintaining midline using mirror feedback. Pt left seated in wc at end of session with needs met, safety belt on, and call bell in reach.   Therapy Documentation Precautions:  Precautions Precautions: Fall Precaution Comments: dizzy, poor postural control Restrictions Weight Bearing Restrictions: No Pain: Pain Assessment Pain Assessment: 0-10 Pain Score: 6  Pain Type: Acute pain Pain Location: Head Pain Orientation: Left Pain Radiating Towards: down face Pain Descriptors / Indicators: Headache;Aching Pain Frequency:  Constant Pain Onset: On-going Pain Intervention(s): Repositioned ADL: ADL ADL Comments: Please see functional navigator  See Function Navigator for Current Functional Status.   Therapy/Group: Individual Therapy  Valma Cava 06/26/2016, 12:48 PM

## 2016-06-26 NOTE — Progress Notes (Signed)
Subjective/Complaints: Left facial pain mainly V2>V1  ROS: pt denies nausea, vomiting, diarrhea, cough, shortness of breath or chest pain   Objective: Vital Signs: Blood pressure (!) 144/72, pulse 61, temperature 98.2 F (36.8 C), temperature source Oral, resp. rate 18, height 6' (1.829 m), weight (!) 152.1 kg (335 lb 6.4 oz), SpO2 97 %. No results found. No results found for this or any previous visit (from the past 72 hour(s)).   HEENT: vertical diplopia in all visual fields Cardio: RRR Resp: CTA B/L GI: ND, BS+ Extremity:  No Edema Skin:   Intact Neuro: Alert/Oriented, Normal Sensory and Other 5/5 BUE and 4/5 BLE, voice is hoarse esp after swallowing solids Musc/Skel:  Normal Gen NAD, obese   Assessment/Plan: 1. 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 Physiatrist is providing close team supervision and 24 hour management of active medical problems listed below. Physiatrist and rehab team continue to assess barriers to discharge/monitor patient progress toward functional and medical goals. FIM: Function - Bathing Position: Wheelchair/chair at sink Body parts bathed by patient: Right arm, Left arm, Chest, Abdomen Body parts bathed by helper: Front perineal area, Buttocks, Right upper leg, Left upper leg, Right lower leg, Left lower leg Assist Level: Touching or steadying assistance(Pt > 75%)  Function- Upper Body Dressing/Undressing What is the patient wearing?: Pull over shirt/dress Pull over shirt/dress - Perfomed by patient: Thread/unthread right sleeve, Thread/unthread left sleeve Pull over shirt/dress - Perfomed by helper: Put head through opening, Pull shirt over trunk Assist Level: Touching or steadying assistance(Pt > 75%) Function - Lower Body Dressing/Undressing What is the patient wearing?: Pants, Underwear Position: Wheelchair/chair at  sink Underwear - Performed by helper: Thread/unthread left underwear leg, Thread/unthread right underwear leg, Pull underwear up/down Pants- Performed by patient: Thread/unthread left pants leg, Thread/unthread right pants leg Pants- Performed by helper: Thread/unthread left pants leg, Thread/unthread right pants leg, Pull pants up/down Assist for footwear: Dependant Assist for lower body dressing: 2 Helpers  Function - Toileting Toileting activity did not occur: No continent bowel/bladder event Toileting steps completed by patient: Adjust clothing after toileting, Adjust clothing prior to toileting, Performs perineal hygiene Toileting steps completed by helper: Adjust clothing prior to toileting, Performs perineal hygiene, Adjust clothing after toileting Toileting Assistive Devices: Grab bar or rail Assist level: Two helpers  Function - Air cabin crew transfer activity did not occur: Safety/medical concerns Toilet transfer assistive device: Elevated toilet seat/BSC over toilet, Grab bar Assist level to toilet: 2 helpers Assist level from toilet: 2 helpers  Function - Chair/bed transfer Chair/bed transfer method: Squat pivot Chair/bed transfer assist level: Touching or steadying assistance (Pt > 75%) Chair/bed transfer assistive device: Armrests Chair/bed transfer details: Verbal cues for precautions/safety  Function - Locomotion: Wheelchair Will patient use wheelchair at discharge?: Yes Type: Manual Max wheelchair distance:  (150 ft) Assist Level: Supervision or verbal cues Assist Level: Supervision or verbal cues Assist Level: Supervision or verbal cues Turns around,maneuvers to table,bed, and toilet,negotiates 3% grade,maneuvers on rugs and over doorsills: No Function - Locomotion: Ambulation Assistive device: Rail in hallway Max distance: 10 Assist level: Moderate assist (Pt 50 - 74%) Assist level: Moderate assist (Pt 50 - 74%) Walk 50 feet with 2 turns activity  did not occur: Safety/medical concerns Walk 150 feet activity did not occur: Safety/medical concerns Walk 10 feet on uneven surfaces activity did not occur: Safety/medical concerns  Function - Comprehension Comprehension: Auditory Comprehension assistive device:  Hearing aids Comprehension assist level: Follows basic conversation/direction with no assist  Function - Expression Expression: Verbal Expression assist level: Expresses basic needs/ideas: With no assist  Function - Social Interaction Social Interaction assist level: Interacts appropriately 90% of the time - Needs monitoring or encouragement for participation or interaction.  Function - Problem Solving Problem solving assist level: Solves basic problems with no assist  Function - Memory Memory assist level: More than reasonable amount of time Patient normally able to recall (first 3 days only): Current season, Location of own room, Staff names and faces, That he or she is in a hospital  Medical Problem List and Plan: 1.  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  DVT Prophylaxis/Anticoagulation: Eliquis 3. Pain Management: Tylenol as needed, consider gabapentin or topamax 4. Mood: Risperdal 0.5 mg daily at bedtime, Zoloft 150 mg daily--stable at present 5. Neuropsych: This patient is capable of making decisions on his own behalf. 6. Skin/Wound Care: Routine skin checks 7. Fluids/Electrolytes/Nutrition: Routine I&O with follow-up chemistries 8. Dysphagia. Mechanical soft thin liquid diet. Follow-up speech therapy. Monitor for any signs of aspiration 9. Atrial fibrillation with history of pulmonary emboli. Tambocor 50 mg every 12 hours. Cardiac rate controlled. 10. OSA/CPAP. Check oxygen saturations every shift 11. Diastolic congestive heart failure. Monitor for any signs of fluid  overload. Lasix 40 mg daily  -needs regular weights 12. Hypertension: Monitor BP 13. Morbid obesity. BMI 48.56. Dietary follow-up, pt states he has lost 13lb since admit but had swelling in legs at home 14. BPH. Proscar 5 mg daily.   -emptying with low pvr's    15. Hyperlipidemia. Lipitor 16. History of bilateral TKA. Patient used a Programmer, multimedia prior to admission 17.  Constipation-improved  -senokot -s bid  -sorbitol prn   LOS (Days) 5 A FACE TO FACE EVALUATION WAS PERFORMED  KIRSTEINS,ANDREW E 06/26/2016, 7:08 AM

## 2016-06-26 NOTE — Progress Notes (Signed)
Speech Language Pathology Daily Session Note  Patient Details  Name: [name] MRN: [id number] Date of Birth: [birth year]  Today's Date: 06/26/2016 SLP Individual Time: 0820-0845 SLP Individual Time Calculation (min): 25 min  Short Term Goals: Week 1: SLP Short Term Goal 1 (Week 1): Patient will consume current diet with minimal overt s/s of aspiration with Mod I.  SLP Short Term Goal 2 (Week 1): Patient will consume trials of Dys. 3 textures without overt s/s of aspiration or reports of pharyngeal residuals over 2 sessions with supervision verbal cues prior to upgrde.   Skilled Therapeutic Interventions: Patient with recent completion of breakfast as a result PO trials were deferred.  RN discussed observed cough x2 over the past 2 days, one per day, which she observed to clear with patient's reflexive cough; recommended to continue with current plan of care and continue to monitor.  Skilled treatment session focused on addressing speech and swallow goals with RMT measurements obtained.  SLP facilitated session by providing set-up and providing Min verbal cues of encouragement to obtain IMST and EMST measurements with manometer.  MIP measurement of 90 cm H2O was highest value obtained with age range being 91.3-51.5 cm H2O; MEP measurement of 118 cm H2O was highest value obtained with age range being 122.2-65.2 cm H2O.  Measurements obtained show no significant deficits in assessed areas and as a result no skilled intervention is recommended at this time.  Continue with current plan of care.   Function:  Eating Eating   Modified Consistency Diet: Yes Eating Assist Level: Swallowing techniques: self managed;More than reasonable amount of time           Cognition Comprehension Comprehension assist level: Follows basic conversation/direction with no assist  Expression   Expression assist level: Expresses basic 90% of the time/requires cueing < 10% of the time.  Social Interaction  Social Interaction assist level: Interacts appropriately 90% of the time - Needs monitoring or encouragement for participation or interaction.  Problem Solving Problem solving assist level: Solves basic problems with no assist  Memory Memory assist level: More than reasonable amount of time    Pain Pain Assessment Pain Assessment: No/denies pain Pain Score: 6  Pain Type: Acute pain Pain Location: Head Pain Orientation: Left Pain Descriptors / Indicators: Headache;Aching Pain Frequency: Constant Pain Onset: On-going Pain Intervention(s): Repositioned  Therapy/Group: Individual Therapy  Carmelia Roller., CCC-SLP [phone]  [place] 06/26/2016, 3:51 PM

## 2016-06-26 NOTE — Progress Notes (Signed)
Continues to complain of HA to left side of head. Patient reports tylenol is usually effective at managing HA. Feeling "uneasy", about HA. PRN ultram given at 2308. CPAP applied by patient.[name]

## 2016-06-26 NOTE — Progress Notes (Signed)
Physical Therapy Session Note  Patient Details  Name: [name] MRN: [id number] Date of Birth: 01-25-1946  Today's Date: 06/26/2016 PT Individual Time: 0905-1005 PT Individual Time Calculation (min): 60 min   Short Term Goals: Week 1:  PT Short Term Goal 1 (Week 1): Pt will transfer with min assist PT Short Term Goal 2 (Week 1): pt will ambulate 100' with LRAD and min assist PT Short Term Goal 3 (Week 1): Pt will initiate stair negotiation with PT PT Short Term Goal 4 (Week 1): Pt will propel w/c x50' with supervision  Skilled Therapeutic Interventions/Progress Updates:    c/o 6/10 HA pain but declines intervention, advised pt to monitor throughout session and to let PT know if desiring intervention.  Session focus on bed mobility, transfers, dynamic sitting balance, standing balance, and pre-gait.  Pt transitions supine<>sit x2 throughout session with supervision with and without hospital bed features.  Dynamic sitting balance EOB for lower body dressing with pt using lateral leans, and sit<>stand to pull pants over hips.  Pt requires steady assist and mod cues for sit<>stand and steady assist for balance while pulling pants over hips.  Pt does well with sit<>stand when placing RUE on top of LUE on R knee to facilitate R weight shift.  Squat/pivot to and from w/c with steady assist throughout session, PT noting improvement in coordination of pivot.  Pre-gait weight shifts and marching in place with verbal cues for L foot placement and min/mod assist for balance.  Pt requires mod cues for hand placement on R knee for stand>sit due to increase in lightheadedness and HA.  Transitioned to supine on mat with heat pad on mid back for c/o tightness and PT switched pt's w/c back to tension back for improved sitting tolerance.  Pt transitions back to w/c at end of session with close supervision.  Returned to room with call bell in reach and needs met.   Therapy Documentation Precautions:   Precautions Precautions: Fall Precaution Comments: dizzy, poor postural control Restrictions Weight Bearing Restrictions: No   See Function Navigator for Current Functional Status.   Therapy/Group: Individual Therapy  [name] 06/26/2016, 10:19 AM

## 2016-06-26 NOTE — Progress Notes (Signed)
Reports HA has resolved. [name] A

## 2016-06-26 NOTE — Progress Notes (Signed)
Physical Therapy Session Note  Patient Details  Name: [name] MRN: [id number] Date of Birth: 02-18-1946  Today's Date: 06/26/2016 PT Individual Time: 1545-1630 PT Individual Time Calculation (min): 45 min   Short Term Goals: Week 1:  PT Short Term Goal 1 (Week 1): Pt will transfer with min assist PT Short Term Goal 2 (Week 1): pt will ambulate 100' with LRAD and min assist PT Short Term Goal 3 (Week 1): Pt will initiate stair negotiation with PT PT Short Term Goal 4 (Week 1): Pt will propel w/c x50' with supervision  Skilled Therapeutic Interventions/Progress Updates:    no c/o pain, reports HA has resolved.  Session focus on activity tolerance, transfers, pre-gait, and NMR.    Pt transitions supine<>sit with supervision and hospital bed features.  Squat/pivot from bed<>w/c (R and L) with steady assist and verbal cues for sequencing.  W/C propulsion to and from therapy gym with UEs for mobility and activity tolerance.  Sit<>stand x2 from w/c with BUE support on R knee for weight shift R at // bars.  Pre-gait activities (marching, side stepping) at // bars focus on weight shift and L foot placement with mirror feedback for LLE.  Kinetron from w/c position x5 minutes at 60 cm/s focus on LLE coordination, motor control, and gradation of movement with less resistance.  Pt returned to room and back to bed with steady assist.  Call bell in reach and needs met.   Therapy Documentation Precautions:  Precautions Precautions: Fall Precaution Comments: dizzy, poor postural control Restrictions Weight Bearing Restrictions: No  See Function Navigator for Current Functional Status.   Therapy/Group: Individual Therapy  [name] 06/26/2016, 4:43 PM

## 2016-06-27 ENCOUNTER — Inpatient Hospital Stay (HOSPITAL_COMMUNITY): Payer: Self-pay | Admitting: Occupational Therapy

## 2016-06-27 ENCOUNTER — Inpatient Hospital Stay (HOSPITAL_COMMUNITY): Payer: Medicare Other

## 2016-06-27 ENCOUNTER — Inpatient Hospital Stay (HOSPITAL_COMMUNITY): Payer: Self-pay | Admitting: Physical Therapy

## 2016-06-27 ENCOUNTER — Inpatient Hospital Stay (HOSPITAL_COMMUNITY): Payer: Medicare Other | Admitting: Speech Pathology

## 2016-06-27 DIAGNOSIS — G463 Brain stem stroke syndrome: Secondary | ICD-10-CM

## 2016-06-27 NOTE — Progress Notes (Signed)
Physical Therapy Session Note  Patient Details  Name: [name] MRN: [id number] Date of Birth: Jan 14, 1946  Today's Date: 06/27/2016 PT Individual Time: 1000-1100 PT Individual Time Calculation (min): 60 min   Short Term Goals: Week 1:  PT Short Term Goal 1 (Week 1): Pt will transfer with min assist PT Short Term Goal 2 (Week 1): pt will ambulate 100' with LRAD and min assist PT Short Term Goal 3 (Week 1): Pt will initiate stair negotiation with PT PT Short Term Goal 4 (Week 1): Pt will propel w/c x50' with supervision  Skilled Therapeutic Interventions/Progress Updates:    no c/o pain, but reports fatigue as therapy has been scheduled back-back today and yesterday.  Session focus on transfers, gait, stair negotiation, and NMR.   Pt transitions sit<>stand throughout session with min assist, using BUE support on R knee for R weight shift.  Gait training 3 trials of 10' with RW, initially mod assist fade to min assist with occasional mod for balance, w/c follow for safety.  Utilized tape on floor and mirror for visual feedback for foot placement and midline orientation as well as verbal cues for shifting to R.  Rest breaks provided between gait trials.  Stair negotiation x4 steps with L ascending/descending rail and +2 for safety, but overall mod assist and verbal cues for sequencing, LLE gradation of movement, and posture.  Nustep x7 minutes at level 2 for reciprocal stepping pattern retraining and overall activity tolerance. Pt returned to room at end of session and performed supervision squat/pivot and supine>sit for back to bed, call bell in reach and needs met.   Therapy Documentation Precautions:  Precautions Precautions: Fall Precaution Comments: dizzy, poor postural control Restrictions Weight Bearing Restrictions: No   See Function Navigator for Current Functional Status.   Therapy/Group: Individual Therapy  [name] 06/27/2016, 10:53 AM

## 2016-06-27 NOTE — Progress Notes (Signed)
Speech Language Pathology Daily Session Note  Patient Details  Name: [name] MRN: [id number] Date of Birth: 23-Sep-1945  Today's Date: 06/27/2016 SLP Individual Time: 0900-1000 SLP Individual Time Calculation (min): 60 min  Short Term Goals: Week 1: SLP Short Term Goal 1 (Week 1): Patient will consume current diet with minimal overt s/s of aspiration with Mod I.  SLP Short Term Goal 2 (Week 1): Patient will consume trials of Dys. 3 textures without overt s/s of aspiration or reports of pharyngeal residuals over 2 sessions with supervision verbal cues prior to upgrde.   Skilled Therapeutic Interventions: Skilled treatment session focused on addressing dysphagia goals. Patient reported that he got chocked twice on milk products yesterday and that he is always nervious that he is going to get chocked if something goes down the wrong side of his throat and that his cough is so weak.  SLP facilitated session by providing set-up of trials of thin via cup which patient consumed with intermittent weak reflexive coughs in 3/10 trials as well as throat clears (baseline per previous MBS).  Given suspicion of vocal fold involvement SLP attempted head turns at bedside to determine if this would effectively reduce s/s of aspiration which right turn did not and left turn did with 0/5, but patient reported if felt different and appeared that he needed a more effortful swallow.  Additionally, patient stated the his head positioned down helps him swallow better as well.  Given inconsistent bedside performance and documented penetration on previous MBS plan to proceed with FEES tomorrow for effectiveness of compensatory strategies as well as to obtain more information about vocal folds and cough strength.  Discussed pro and cons of procedure and patient in agreement with plan for FEES tomorrow at 10.  Continue with current plan of care for now given stable vitals.   Function:  Eating Eating   Modified  Consistency Diet: Yes Eating Assist Level: Swallowing techniques: self managed;More than reasonable amount of time;Supervision or verbal cues;Set up assist for   Eating Set Up Assist For: Opening containers       Cognition Comprehension Comprehension assist level: Follows complex conversation/direction with no assist  Expression   Expression assist level: Expresses complex 90% of the time/cues < 10% of the time  Social Interaction Social Interaction assist level: Interacts appropriately 90% of the time - Needs monitoring or encouragement for participation or interaction.  Problem Solving Problem solving assist level: Solves complex problems: With extra time  Memory Memory assist level: More than reasonable amount of time    Pain Pain Assessment Pain Assessment: No/denies pain Pain Score: 2  Pain Type: Acute pain Pain Location: Head Pain Descriptors / Indicators: Headache Pain Intervention(s): Repositioned  Therapy/Group: Individual Therapy  Carmelia Roller., [place] [phone]  [name] 06/27/2016, 1:23 PM

## 2016-06-27 NOTE — Plan of Care (Signed)
Problem: RH BOWEL ELIMINATION Goal: RH STG MANAGE BOWEL WITH ASSISTANCE STG Manage Bowel with min Assistance.  Outcome: Not Progressing LBM 06-24-16 Goal: RH STG MANAGE BOWEL W/MEDICATION W/ASSISTANCE STG Manage Bowel with Medication with min Assistance.  Outcome: Not Progressing LBM 06-24-16

## 2016-06-27 NOTE — Plan of Care (Signed)
Problem: RH BOWEL ELIMINATION Goal: RH STG MANAGE BOWEL WITH ASSISTANCE STG Manage Bowel with min Assistance.  Outcome: Not Progressing Patient refusing additional laxatives, Dulcolax given last HS

## 2016-06-27 NOTE — Progress Notes (Signed)
Subjective/Complaints: Left facial pain improved with Tramadol  ROS: pt denies nausea, vomiting, diarrhea, cough, shortness of breath or chest pain   Objective: Vital Signs: Blood pressure (!) 143/76, pulse 60, temperature 98.8 F (37.1 C), temperature source Oral, resp. rate 18, height 6' (1.829 m), weight (!) 152.8 kg (336 lb 13.8 oz), SpO2 94 %. No results found. No results found for this or any previous visit (from the past 72 hour(s)).   HEENT: vertical diplopia in all visual fields Cardio: RRR Resp: CTA B/L GI: ND, BS+ Extremity:  No Edema Skin:   Intact Neuro: Alert/Oriented, Normal Sensory and Other 5/5 BUE and 4/5 BLE, voice is hoarse esp after swallowing solids Musc/Skel:  Normal Gen NAD, obese   Assessment/Plan: 1. 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 Physiatrist is providing close team supervision and 24 hour management of active medical problems listed below. Physiatrist and rehab team continue to assess barriers to discharge/monitor patient progress toward functional and medical goals. FIM: Function - Bathing Position: Shower Body parts bathed by patient: Right arm, Left arm, Chest, Abdomen, Left lower leg, Right lower leg, Left upper leg, Front perineal area, Right upper leg Body parts bathed by helper: Buttocks Assist Level: Touching or steadying assistance(Pt > 75%)  Function- Upper Body Dressing/Undressing What is the patient wearing?: Pull over shirt/dress Pull over shirt/dress - Perfomed by patient: Thread/unthread right sleeve, Thread/unthread left sleeve, Put head through opening Pull over shirt/dress - Perfomed by helper: Pull shirt over trunk Assist Level: Touching or steadying assistance(Pt > 75%), Assistive device Assistive Device Comment: long handled sponge Function - Lower Body Dressing/Undressing What is the patient  wearing?: Pants, Underwear Position: Wheelchair/chair at sink Underwear - Performed by patient: Thread/unthread right underwear leg, Thread/unthread left underwear leg Underwear - Performed by helper: Pull underwear up/down Pants- Performed by patient: Thread/unthread right pants leg, Thread/unthread left pants leg Pants- Performed by helper: Pull pants up/down Non-skid slipper socks- Performed by helper: Don/doff right sock, Don/doff left sock Assist for footwear: Dependant Assist for lower body dressing: Assistive device (Mod A) Assistive Device Comment: reacher, sock-aid  Function - Toileting Toileting activity did not occur: No continent bowel/bladder event Toileting steps completed by patient: Adjust clothing after toileting, Adjust clothing prior to toileting, Performs perineal hygiene Toileting steps completed by helper: Adjust clothing prior to toileting, Performs perineal hygiene, Adjust clothing after toileting Toileting Assistive Devices: Grab bar or rail Assist level: Set up/obtain supplies  Function - Air cabin crew transfer activity did not occur: Safety/medical concerns Toilet transfer assistive device: Elevated toilet seat/BSC over toilet, Grab bar Assist level to toilet: 2 helpers Assist level from toilet: 2 helpers  Function - Chair/bed transfer Chair/bed transfer method: Squat pivot Chair/bed transfer assist level: Touching or steadying assistance (Pt > 75%) Chair/bed transfer assistive device: Armrests Chair/bed transfer details: Verbal cues for precautions/safety  Function - Locomotion: Wheelchair Will patient use wheelchair at discharge?: Yes Type: Manual Max wheelchair distance:  (150 ft) Assist Level: Supervision or verbal cues Assist Level: Supervision or verbal cues Assist Level: Supervision or verbal cues Turns around,maneuvers to table,bed, and toilet,negotiates 3% grade,maneuvers on rugs and over doorsills: No Function - Locomotion:  Ambulation Assistive device: Rail in hallway Max distance: 10 Assist level: Moderate assist (Pt 50 - 74%) Assist level: Moderate assist (Pt 50 - 74%) Walk 50 feet with 2 turns activity did not occur: Safety/medical concerns Walk 150 feet activity did not  occur: Safety/medical concerns Walk 10 feet on uneven surfaces activity did not occur: Safety/medical concerns  Function - Comprehension Comprehension: Auditory Comprehension assistive device: Hearing aids Comprehension assist level: Follows basic conversation/direction with no assist  Function - Expression Expression: Verbal Expression assist level: Expresses basic 90% of the time/requires cueing < 10% of the time.  Function - Social Interaction Social Interaction assist level: Interacts appropriately 90% of the time - Needs monitoring or encouragement for participation or interaction.  Function - Problem Solving Problem solving assist level: Solves basic problems with no assist  Function - Memory Memory assist level: More than reasonable amount of time Patient normally able to recall (first 3 days only): Current season, Location of own room, Staff names and faces, That he or she is in a hospital  Medical Problem List and Plan: 1.  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  DVT Prophylaxis/Anticoagulation: Eliquis 3. Pain Management: Tylenol as needed, consider cont prn Tramadol 4. Mood: Risperdal 0.5 mg daily at bedtime, Zoloft 150 mg daily--stable at present 5. Neuropsych: This patient is capable of making decisions on his own behalf. 6. Skin/Wound Care: Routine skin checks 7. Fluids/Electrolytes/Nutrition: Routine I&O with follow-up chemistries 8. Dysphagia. Mechanical soft thin liquid diet. Follow-up speech therapy. Monitor for any signs of aspiration 9. Atrial fibrillation with  history of pulmonary emboli. Tambocor 50 mg every 12 hours. Cardiac rate controlled. 10. OSA/CPAP. Check oxygen saturations every shift 11. Diastolic congestive heart failure. Monitor for any signs of fluid overload. Lasix 40 mg daily  -needs regular weights 12. Hypertension: Monitor BP 13. Morbid obesity. BMI 48.56. Dietary follow-up, pt states he has lost 13lb since admit but had swelling in legs at home 14. BPH. Proscar 5 mg daily.   -emptying with low pvr's    15. Hyperlipidemia. Lipitor 16. History of bilateral TKA. Patient used a Programmer, multimedia prior to admission 17.  Constipation-uses dulcolax at home -senokot -s bid  -sorbitol prn   LOS (Days) 6 A FACE TO FACE EVALUATION WAS PERFORMED  KIRSTEINS,ANDREW E 06/27/2016, 7:11 AM

## 2016-06-27 NOTE — Progress Notes (Signed)
Physical Therapy Session Note  Patient Details  Name: [name] MRN: [id number] Date of Birth: [birth year]  Today's Date: 06/27/2016 PT Individual Time: 1530-1600 PT Individual Time Calculation (min): 30 min   Short Term Goals: Week 1:  PT Short Term Goal 1 (Week 1): Pt will transfer with min assist PT Short Term Goal 2 (Week 1): pt will ambulate 100' with LRAD and min assist PT Short Term Goal 3 (Week 1): Pt will initiate stair negotiation with PT PT Short Term Goal 4 (Week 1): Pt will propel w/c x50' with supervision  Skilled Therapeutic Interventions/Progress Updates:    no c/o pain.  Session focus on LLE coordination and motor planning.  Pt transitioned supine>sitting EOB supervision and donned shoes with supervision at EOB.  Squat/pivot to w/c on L with steady assist and increased time for sequencing.  Pt propels w/c to therapy gym with BUEs for endurance and mobility.  Pt performs sit<>Stand x2 from w/c with BUE support on R knee, verbal cues to move LUE to knee prior to sitting for weight shift.  Static standing x2 trials focus on LLE coordination, gradation of movement, and midline orientation during LLE toe taps to target.  Pt requires up to mod assist and mod cues to maintain R weight shift for midline orientation.  Pt returned to room at end of session and positioned upright in w/c with call bell in reach and needs met.  Therapy Documentation Precautions:  Precautions Precautions: Fall Precaution Comments: dizzy, poor postural control Restrictions Weight Bearing Restrictions: No   See Function Navigator for Current Functional Status.   Therapy/Group: Individual Therapy  [name] 06/27/2016, 4:36 PM

## 2016-06-27 NOTE — Progress Notes (Signed)
Occupational Therapy Session Note  Patient Details  Name: [name] MRN: [id number] Date of Birth: 1945/07/12  Today's Date: 06/27/2016 OT Individual Time: 0800-0900 OT Individual Time Calculation (min): 60 min   Short Term Goals: Week 1:  OT Short Term Goal 1 (Week 1): Pt will maintain sitting balance while performing ADL task with supervision OT Short Term Goal 2 (Week 1): Pt will complete toilet transfer with min A OT Short Term Goal 3 (Week 1): Pt will tolerate standing at the sink for 2 grooming tasks with min A for balance  Skilled Therapeutic Interventions/Progress Updates:    OT treatment session focused on transfer training, sitting balance, trunk control, and L NMR. Pt propelled wc to tub room and OT demonstrated transfer. Pt completed sqaut-pivot with mod A, and  A to swing LLE over tub bench. Pt exited tub in similar fashion as above. Dynamic sitting balance using mirror feedback, NMR and NDT techniques to facilitate trunk extension, trunk control, and anterior pelvic tilt. Raised block placed under LLE to promote weight bearing and assist with weight shift to R with dynamic reaching. Improved sitting balance with repetition and min verbal cues. Pt returned to room at end of session and completed grooming tasks from wc while maintaining midline posture. Pt left with needs met and safety belt on.   Therapy Documentation Precautions:  Precautions Precautions: Fall Precaution Comments: dizzy, poor postural control Restrictions Weight Bearing Restrictions: No Pain: Pain Assessment Pain Assessment: 0-10 Pain Score: 2  Pain Type: Acute pain Pain Location: Head Pain Descriptors / Indicators: Headache Pain Intervention(s): Repositioned ADL: ADL ADL Comments: Please see functional navigator  See Function Navigator for Current Functional Status.   Therapy/Group: Individual Therapy  Valma Cava 06/27/2016, 12:40 PM

## 2016-06-28 ENCOUNTER — Inpatient Hospital Stay (HOSPITAL_COMMUNITY): Payer: Self-pay | Admitting: Occupational Therapy

## 2016-06-28 ENCOUNTER — Inpatient Hospital Stay (HOSPITAL_COMMUNITY): Payer: Self-pay | Admitting: Physical Therapy

## 2016-06-28 ENCOUNTER — Inpatient Hospital Stay (HOSPITAL_COMMUNITY): Payer: Medicare Other | Admitting: Speech Pathology

## 2016-06-28 NOTE — Progress Notes (Signed)
Social Work Patient ID: [name], male   DOB: [birth year], 71 y.o.   MRN: [id number]  Met with pt to discuss team conference goals supervision to min assist level and target discharge 4/11. Discussed main objective is his wife being able to manage him at home with her own limitations. She is planning to get measurements of doorways, to see if home would be wheelchair accessible for Pt, if needed. Wife is here often and see's pt in therapies. Pt is motivated to do well and will do whatever he needs to do to get the level he and wife can manage at home. Continue to work on discharge plans.

## 2016-06-28 NOTE — Procedures (Signed)
Objective Swallowing Evaluation: FEES-Fiberoptic Endoscopic Evaluation of Swallow  Patient Details  Name: [name] MRN: [id number] Date of Birth: [birth year]  Today's Date: 06/28/2016 Time:  3790-2409 65 minutes  Past Medical History:  Past Medical History:  Diagnosis Date  . Anemia 07/29/2013  . Asthma    childhood asthma  . Benign prostatic hypertrophy    several prostate biopsies neg for cancer.   . Bilateral hip pain   . Cancer ([place])   . CHF (congestive heart failure) ([place])   . Complication of anesthesia    MORPHINE CAUSES HALLUCINATIONS  . CVA (cerebral vascular accident) ([place]) 06/2016  . Depression   . FH: colonic polyps 2010  . Gout   . History of kidney stones   . Hypertension   . Kidney cysts   . Morbid obesity ([place])   . Numbness    feet / legs  . PAF (paroxysmal atrial fibrillation) with RVR 07/29/2013  . Sleep apnea    USES C PAP   Past Surgical History:  Past Surgical History:  Procedure Laterality Date  . ARTHROSCOPY KNEE W/ DRILLING    . BREATH TEK H PYLORI N/A 12/23/2012   Procedure: BREATH TEK H PYLORI;  Surgeon: Gayland Curry, MD;  Location: [facility];  Service: General;  Laterality: N/A;  . CHOLECYSTECTOMY  1989  . COLONOSCOPY N/A 07/31/2013   Procedure: COLONOSCOPY;  Surgeon: Gatha Mayer, MD;  Location: [facility];  Service: Endoscopy;  Laterality: N/A;  . ESOPHAGOGASTRODUODENOSCOPY N/A 07/22/2013   Procedure: ESOPHAGOGASTRODUODENOSCOPY (EGD);  Surgeon: Irene Shipper, MD;  Location[facility] END[facility]COPY;  Service: Endoscopy;  Laterality: N/A;  . ESOPHAGOGASTRODUODENOSCOPY N/A 07/31/2013   Procedure: ESOPHAGOGASTRODUODENOSCOPY (EGD);  Surgeon: Gatha Mayer, MD;  Location[facility];  Service: Endoscopy;  Laterality: N/A;  . INTRAOPERATIVE TRANSESOPHAGEAL ECHOCARDIOGRAM N/A 07/18/2013   Procedure: INTRAOPERATIVE TRANSESOPHAGEAL ECHOCARDIOGRAM;  Surgeon: Grace Isaac, MD;  Location: [facility];  Service: Open Heart Surgery;  Laterality: N/A;   . LAPAROSCOPIC GASTRIC BANDING WITH HIATAL HERNIA REPAIR N/A 04/08/2013   Procedure: LAPAROSCOPIC GASTRIC BANDING WITH possible  HIATAL HERNIA REPAIR;  Surgeon: Gayland Curry, MD;  Location: [facility];  Service: General;  Laterality: N/A;  . LITHOTRIPSY    . renal calculi    . SUBXYPHOID PERICARDIAL WINDOW N/A 07/18/2013   Procedure: SUBXYPHOID PERICARDIAL WINDOW;  Surgeon: Grace Isaac, MD;  Location: [facility];  Service: Thoracic;  Laterality: N/A;  . TONSILLECTOMY    . total knee replacment     bilat  . trigger finger repair     also lue, cts surgically repaired  . WISDOM TOOTH EXTRACTION     HPI:  71 yo male adm to [facility] with headache, found to have single focus of remote microhemorrhage in the L Pon, abnormal signal within V4 segment of L vertebral artery, faint focus of reduced diffusion within L medulla concerning for small infarct. Pt with worsening neurological symptoms during hospital stay including left sided weakness, dysphagia, vision deficits, and dizziness.  Pt initially passed RNSSS; however, SLP swallow evaluation ordered due to pt's sudden onset of dysphagia. BSE completed 3/20 rec: NPO with ice chips, MBS completed 3/21 rec: Dys.3 textures, pureed meats, and thin liquids with no straws. Patient admitted to CIR and has been particiapting in therapies with inconsistent and intermittent s/s of aspiration at bedside, along with changes in cough strength and vocal quality. As a result FEES recommended for re-evaluation of swallow.        Recommendation/Prognosis  Clinical Impression:   SLP Visit Diagnosis: Dysphagia, pharyngeal phase (R13.13) Impact on safety and function: Mild aspiration risk (with strict use of compensatory strategies )   Swallow Evaluation  & Recommendations:   FEES complete.  Patient demonstrates improvements in oral phase of swallow as compared to previous objective assessment.  Anatomy is remarkable for abduction of the left arytenoid and no visible left sided  pharyngeal squeeze.  Patient demonstrates good strength and sensation; however, incomplete glottal closure results in penetration and aspiration of thin liquids that patient is at times unable to clear despite cough attempts.  Of note, patient's cough sounds strong, but he reports notable weakness since stroke.  Nectar-thick liquids or small sips of thin liquids with use of a left head turn and tuck effectively protected airway and resulted in increased patient comfort.  Recommend continuation of current diet with implementation of above mentioned strategy and SLP follow up for recall and carryover.      Prognosis:  Prognosis for Safe Diet Advancement: Good Barriers to Reach Goals: Other (Comment) (function of left arytenoid)   Individuals Consulted: Consulted and Agree with Results and Recommendations: Patient;PA;RN      SLP Assessment/Plan  Plan:   Refer to care plan for details    Short Term Goals: Week 1: SLP Short Term Goal 1 (Week 1): Patient will consume current diet with minimal overt s/s of aspiration with Mod I.  SLP Short Term Goal 2 (Week 1): Patient will consume trials of Dys. 3 textures without overt s/s of aspiration or reports of pharyngeal residuals over 2 sessions with supervision verbal cues prior to upgrde.  SLP Short Term Goal 3 (Week 1): Patient will recall and utilize left head turn and tuck with thin liquid sips with minimal overt s/s of aspiration with Supervision level verbal cues.     General: Date of Onset: 06/19/16 Type of Study: FEES-Fiberoptic Endoscopic Evaluation of Swallow Previous Swallow Assessment: MBS on 3/21: Recommended Dys. 3 textures with pureed meats and thin liquids  Diet Prior to this Study: Dysphagia 2 (chopped);Thin liquids Temperature Spikes Noted: No History of Recent Intubation: No Oral Cavity - Dentition: Adequate natural dentition Self-Feeding Abilities: Able to feed self Baseline Vocal Quality: Breathy;Hoarse;Low vocal  intensity Volitional Cough: Strong;Weak (weaker for him) Volitional Swallow: Able to elicit Anatomy: Within functional limits Pharyngeal Secretions: Normal          Oral Phase:  Refer to flowsheets for information    Pharyngeal Phase:      Cervical Esophageal Phase:      GN          Gunnar Fusi, M.A., CCC-SLP [phone]  Merdis Snodgrass 06/28/2016, 3:28 PM

## 2016-06-28 NOTE — Progress Notes (Signed)
Physical Therapy Session Note  Patient Details  Name: [name] MRN: [id number] Date of Birth: Nov 16, 1945  Today's Date: 06/28/2016 PT Individual Time: 1515-1630 PT Individual Time Calculation (min): 75 min   Short Term Goals: Week 1:  PT Short Term Goal 1 (Week 1): Pt will transfer with min assist PT Short Term Goal 2 (Week 1): pt will ambulate 100' with LRAD and min assist PT Short Term Goal 3 (Week 1): Pt will initiate stair negotiation with PT PT Short Term Goal 4 (Week 1): Pt will propel w/c x50' with supervision  Skilled Therapeutic Interventions/Progress Updates:    no c/o pain.  Session focus on transfers, gait training, and w/c mobility.  Pt transitions supine>sitting EOB and dons socks/shoes EOB with supervision.   Pt propels w/c throughout unit mod I.  PT instructed pt in w/c obstacle course with mod verbal cues to attend to cones in L visual field 2/2 patch on L eye.  Pt completed obstacle course x6 but continues to require cues when distracted for L attention.    Gait training with RW [phone]' with mirror for visual feedback and overall min assist and min verbal cues for pacing, midline orientation, and foot placement.  Seated rest breaks provided between trials.   Pt requesting to toilet at end of session and performs toilet transfer with grab bars and steady assist.  Pt requires assist to pull pants down/up and total assist for hygiene for continent BM.  Pt left upright in w/c at end of session with call bell in reach and needs met.   Therapy Documentation Precautions:  Precautions Precautions: Fall Precaution Comments: dizzy, poor postural control Restrictions Weight Bearing Restrictions: No   See Function Navigator for Current Functional Status.   Therapy/Group: Individual Therapy  [name] 06/28/2016, 4:33 PM

## 2016-06-28 NOTE — Progress Notes (Signed)
Subjective/Complaints:  No issues overnite discussed complaints of left facial pain and Left lower incoordination  ROS: pt denies nausea, vomiting, diarrhea, cough, shortness of breath or chest pain   Objective: Vital Signs: Blood pressure (!) 169/63, pulse 63, temperature 98.4 F (36.9 C), temperature source Oral, resp. rate 18, height 6' (1.829 m), weight (!) 152.8 kg (336 lb 13.8 oz), SpO2 98 %. No results found. No results found for this or any previous visit (from the past 72 hour(s)).   HEENT: vertical diplopia in all visual fields Cardio: RRR Resp: CTA B/L GI: ND, BS+ Extremity:  No Edema Skin:   Intact Neuro: Alert/Oriented, Normal Sensory and Other 5/5 BUE and 4/5 BLE, voice is hoarse esp after swallowing solids Musc/Skel:  Normal Gen NAD, obese   Assessment/Plan: 1. 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 Physiatrist is providing close team supervision and 24 hour management of active medical problems listed below. Physiatrist and rehab team continue to assess barriers to discharge/monitor patient progress toward functional and medical goals. FIM: Function - Bathing Position: Shower Body parts bathed by patient: Right arm, Left arm, Chest, Abdomen, Left lower leg, Right lower leg, Left upper leg, Front perineal area, Right upper leg Body parts bathed by helper: Buttocks Assist Level: Touching or steadying assistance(Pt > 75%)  Function- Upper Body Dressing/Undressing What is the patient wearing?: Pull over shirt/dress Pull over shirt/dress - Perfomed by patient: Thread/unthread right sleeve, Thread/unthread left sleeve, Put head through opening Pull over shirt/dress - Perfomed by helper: Pull shirt over trunk Assist Level: Touching or steadying assistance(Pt > 75%), Assistive device Assistive Device Comment: long handled sponge Function - Lower  Body Dressing/Undressing What is the patient wearing?: Pants, Underwear Position: Wheelchair/chair at sink Underwear - Performed by patient: Thread/unthread right underwear leg, Thread/unthread left underwear leg Underwear - Performed by helper: Pull underwear up/down Pants- Performed by patient: Thread/unthread right pants leg, Thread/unthread left pants leg Pants- Performed by helper: Pull pants up/down Non-skid slipper socks- Performed by helper: Don/doff right sock, Don/doff left sock Assist for footwear: Dependant Assist for lower body dressing: Assistive device (Mod A) Assistive Device Comment: reacher, sock-aid  Function - Toileting Toileting activity did not occur: No continent bowel/bladder event Toileting steps completed by patient: Adjust clothing after toileting, Adjust clothing prior to toileting, Performs perineal hygiene Toileting steps completed by helper: Adjust clothing prior to toileting, Performs perineal hygiene, Adjust clothing after toileting Toileting Assistive Devices: Grab bar or rail Assist level: Set up/obtain supplies  Function - Air cabin crew transfer activity did not occur: Safety/medical concerns Toilet transfer assistive device: Elevated toilet seat/BSC over toilet, Grab bar Assist level to toilet: 2 helpers Assist level from toilet: 2 helpers  Function - Chair/bed transfer Chair/bed transfer method: Squat pivot Chair/bed transfer assist level: Touching or steadying assistance (Pt > 75%) Chair/bed transfer assistive device: Armrests Chair/bed transfer details: Verbal cues for precautions/safety  Function - Locomotion: Wheelchair Will patient use wheelchair at discharge?: Yes Type: Manual Max wheelchair distance:  (150 ft) Assist Level: Supervision or verbal cues Assist Level: Supervision or verbal cues Assist Level: Supervision or verbal cues Turns around,maneuvers to table,bed, and toilet,negotiates 3% grade,maneuvers on rugs and over  doorsills: No Function - Locomotion: Ambulation Assistive device: Rail in hallway Max distance: 10 Assist level: Moderate assist (Pt 50 - 74%) Assist level: Moderate assist (Pt 50 - 74%) Walk 50 feet with 2 turns activity did not occur:  Safety/medical concerns Walk 150 feet activity did not occur: Safety/medical concerns Walk 10 feet on uneven surfaces activity did not occur: Safety/medical concerns  Function - Comprehension Comprehension: Auditory Comprehension assistive device: Hearing aids Comprehension assist level: Follows complex conversation/direction with no assist  Function - Expression Expression: Verbal Expression assist level: Expresses complex 90% of the time/cues < 10% of the time  Function - Social Interaction Social Interaction assist level: Interacts appropriately 90% of the time - Needs monitoring or encouragement for participation or interaction.  Function - Problem Solving Problem solving assist level: Solves complex problems: With extra time  Function - Memory Memory assist level: More than reasonable amount of time Patient normally able to recall (first 3 days only): Current season, Location of own room, Staff names and faces, That he or she is in a hospital  Medical Problem List and Plan: 1.  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 Formulized individual treatment plan based on medical history, underlying problem and comorbidities. 2.  DVT Prophylaxis/Anticoagulation: Eliquis 3. Pain Management: Tylenol as needed, consider cont prn Tramadol 4. Mood: Risperdal 0.5 mg daily at bedtime, Zoloft 150 mg daily--stable at present 5. Neuropsych: This patient is capable of making  decisions on his own behalf. 6. Skin/Wound Care: Routine skin checks 7. Fluids/Electrolytes/Nutrition: Routine I&O with follow-up chemistries 8. Dysphagia. Mechanical soft thin liquid diet. Follow-up speech therapy. Monitor for any signs of aspiration 9. Atrial fibrillation with history of pulmonary emboli. Tambocor 50 mg every 12 hours. Cardiac rate controlled. 10. OSA/CPAP. Check oxygen saturations every shift 11. Diastolic congestive heart failure. Monitor for any signs of fluid overload. Lasix 40 mg daily  -needs regular weights 12. Hypertension: Monitor BP 13. Morbid obesity. BMI 48.56. Dietary follow-up, pt states he has lost 13lb since admit but had swelling in legs at home 14. BPH. Proscar 5 mg daily.   -emptying with low pvr's    15. Hyperlipidemia. Lipitor 16. History of bilateral TKA. Patient used a Programmer, multimedia prior to admission 17.  Constipation-uses dulcolax at home -senokot -s bid  -sorbitol prn   LOS (Days) 7 A FACE TO FACE EVALUATION WAS PERFORMED  Evalyse Stroope E 06/28/2016, 7:45 AM

## 2016-06-28 NOTE — Patient Care Conference (Signed)
Inpatient RehabilitationTeam Conference and Plan of Care Update Date: 06/28/2016   Time: 11:00 Am    Patient Name: [name]      Medical Record Number: [id number]  Date of Birth: Aug 11, 1945 Sex: Male         Room/Bed: 4M03C/4M03C-01 Payor Info: Payor: MEDICARE / Plan: MEDICARE PART A AND B / Product Type: *No Product type* /    Admitting Diagnosis: L CVA  Admit Date/Time:  06/21/2016  4:39 PM Admission Comments: No comment available   Primary Diagnosis:  <principal problem not specified> Principal Problem: <principal problem not specified>  Patient Active Problem List   Diagnosis Date Noted  . Embolic cerebral infarction ([place]) 06/21/2016  . Stroke syndrome ([place])   . OSA (obstructive sleep apnea)   . Hyperlipidemia   . Left-sided weakness   . History of pulmonary embolism   . History of total knee arthroplasty, bilateral   . Dysphagia, post-stroke   . Hypokalemia   . Ataxia, post-stroke   . Neurologic gait disorder   . Headache 06/19/2016  . Claudication ([place]) 04/28/2016  . Encounter for monitoring flecainide therapy 04/28/2016  . Shortness of breath 04/28/2016  . Pure hyperglyceridemia 04/26/2016  . Skin lesion of chest wall 04/26/2016  . Recurrent pulmonary emboli ([place]) 06/13/2015  . PE (pulmonary embolism) 06/13/2015  . GERD (gastroesophageal reflux disease) 06/13/2015  . Asthma 06/13/2015  . Basal cell carcinoma of skin 06/28/2014  . 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 Sinus bradycardia 12/15/2013  . Abnormal serum level of alkaline phosphatase 08/01/2013  . PAF (paroxysmal atrial fibrillation) ([place]) 07/29/2013  . Chronic anticoagulation 07/22/2013  . Gastric ulcer with hemorrhage 07/20/13 07/22/2013  . Pulmonary thromboembolism- 07/16/13 07/16/2013  . Pericardial effusion s/p pericardial window 07/18/13 400cc 07/16/2013  . Diastolic congestive heart failure ([place]) 07/16/2013  . H/O laparoscopic adjustable gastric banding & hiatal hernia repair  04/08/13 04/23/2013  . Prediabetes 02/05/2013  . TMJ (temporomandibular joint syndrome) 09/11/2012  . Morbid obesity ([place]) 08/09/2012  . Peripheral neuropathy ([place]) 11/02/2009  . Hyperlipidemia with target LDL less than 130 09/10/2008  . Depression 09/10/2008  . Obstructive sleep apnea 09/10/2008  . GOUT 06/28/2006  . HTN (hypertension) 06/28/2006  . BPH (benign prostatic hyperplasia) 06/28/2006    Expected Discharge Date: Expected Discharge Date: 07/12/16  Team Members Present: Physician leading conference: Ilean Skill, PsyD;Dr. Alysia Penna Social Worker Present: Ovidio Kin, LCSW Nurse Present: Elliot Cousin, RN PT Present: Dwyane Dee, PT OT Present: Cherylynn Ridges, OT SLP Present: Weston Anna, SLP PPS Coordinator present : Daiva Nakayama, RN, CRRN     Current Status/Progress Goal Weekly Team Focus  Medical   anxiety, Leftsided ataxia, poor balance  mantain medical stability dueing rehab stay  work on standing balance   Bowel/Bladder   Continent of bowel and bladder  Remain continent of bowel and bladder  Assist patient with toileting needs prn   Swallow/Nutrition/ Hydration   Intermittent supervision with Dys. 2 textures and thin liquids via cup   Mod I   FEES today, possible use of RMT, pharyngeal strenghtening exercises, tolerance    ADL's   Min A UB dressing, Mod A transfer, Mod A LB dressing  Mod I/supervision  NMR, transfer training, sitting/standing balance, ataxia, vision, pt/family ed   Mobility   min squat/pivot, mod/max stand pivot and gait  supervision overall  balance, NMR, coordination, transfers, gait   Communication   Hoarse vocal quality, 100% intelligible         Safety/Cognition/ Behavioral  Observations            Pain   pain managed with prn tylenol and ultram  Pain <3  Assess pain q shift and prn   Skin   edema to BLE,   No new skin issues   Assess skin q shift and prn      *See Care Plan and progress notes for long and  short-term goals.  Barriers to Discharge: see above    Possible Resolutions to Barriers:  cont rehab    Discharge Planning/Teaching Needs:  Home with wife who can not provide any physical care, can do supervision level only. She has been here and observed in therapies.      Team Discussion:  Goals of supervision to min assist level. Wife to get measurements of doorways to see if home wheelchair accessible. Balance is better, but dizziness is off and on. Requires cues at times. Speech to try RMT training. Sleeping better. Repeat Fee's now with head turn and chin tuck same diet. Need to see if can reach a level wife can manage him at home due to her limitations.  Revisions to Treatment Plan:  DC 4/11   Continued Need for Acute Rehabilitation Level of Care: The patient requires daily medical management by a physician with specialized training in physical medicine and rehabilitation for the following conditions: Daily direction of a multidisciplinary physical rehabilitation program to ensure safe treatment while eliciting the highest outcome that is of practical value to the patient.: Yes Daily medical management of patient stability for increased activity during participation in an intensive rehabilitation regime.: Yes Daily analysis of laboratory values and/or radiology reports with any subsequent need for medication adjustment of medical intervention for : Neurological problems;Other  Brynn Mulgrew, Gardiner Rhyme 06/28/2016, 1:05 PM

## 2016-06-28 NOTE — Progress Notes (Signed)
Social Work Elease Hashimoto, LCSW Social Worker Signed   Patient Care Conference Date of Service: 06/28/2016  1:05 PM      Hide copied text Hover for attribution information Inpatient RehabilitationTeam Conference and Plan of Care Update Date: 06/28/2016   Time: 11:00 Am      Patient Name: [name]      Medical Record Number: [id number]  Date of Birth: 1946-01-27 Sex: Male         Room/Bed: 4M03C/4M03C-01 Payor Info: Payor: MEDICARE / Plan: MEDICARE PART A AND B / Product Type: *No Product type* /     Admitting Diagnosis: L CVA  Admit Date/Time:  06/21/2016  4:39 PM Admission Comments: No comment available    Primary Diagnosis:  <principal problem not specified> Principal Problem: <principal problem not specified>       Patient Active Problem List    Diagnosis Date Noted  . Embolic cerebral infarction ([place]) 06/21/2016  . Stroke syndrome ([place])    . OSA (obstructive sleep apnea)    . Hyperlipidemia    . Left-sided weakness    . History of pulmonary embolism    . History of total knee arthroplasty, bilateral    . Dysphagia, post-stroke    . Hypokalemia    . Ataxia, post-stroke    . Neurologic gait disorder    . Headache 06/19/2016  . Claudication ([place]) 04/28/2016  . Encounter for monitoring flecainide therapy 04/28/2016  . Shortness of breath 04/28/2016  . Pure hyperglyceridemia 04/26/2016  . Skin lesion of chest wall 04/26/2016  . Recurrent pulmonary emboli ([place]) 06/13/2015  . PE (pulmonary embolism) 06/13/2015  . GERD (gastroesophageal reflux disease) 06/13/2015  . Asthma 06/13/2015  . Basal cell carcinoma of skin 06/28/2014  . 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 Sinus bradycardia 12/15/2013  . Abnormal serum level of alkaline phosphatase 08/01/2013  . PAF (paroxysmal atrial fibrillation) ([place]) 07/29/2013  . Chronic anticoagulation 07/22/2013  . Gastric ulcer with hemorrhage 07/20/13 07/22/2013  . Pulmonary thromboembolism- 07/16/13 07/16/2013    . Pericardial effusion s/p pericardial window 07/18/13 400cc 07/16/2013  . Diastolic congestive heart failure ([place]) 07/16/2013  . H/O laparoscopic adjustable gastric banding & hiatal hernia repair 04/08/13 04/23/2013  . Prediabetes 02/05/2013  . TMJ (temporomandibular joint syndrome) 09/11/2012  . Morbid obesity ([place]) 08/09/2012  . Peripheral neuropathy ([place]) 11/02/2009  . Hyperlipidemia with target LDL less than 130 09/10/2008  . Depression 09/10/2008  . Obstructive sleep apnea 09/10/2008  . GOUT 06/28/2006  . HTN (hypertension) 06/28/2006  . BPH (benign prostatic hyperplasia) 06/28/2006      Expected Discharge Date: Expected Discharge Date: 07/12/16   Team Members Present: Physician leading conference: Ilean Skill, PsyD;Dr. Alysia Penna Social Worker Present: Ovidio Kin, LCSW Nurse Present: Elliot Cousin, RN PT Present: Dwyane Dee, PT OT Present: Cherylynn Ridges, OT SLP Present: Weston Anna, SLP PPS Coordinator present : Daiva Nakayama, RN, CRRN       Current Status/Progress Goal Weekly Team Focus  Medical     anxiety, Leftsided ataxia, poor balance  mantain medical stability dueing rehab stay  work on standing balance   Bowel/Bladder     Continent of bowel and bladder  Remain continent of bowel and bladder  Assist patient with toileting needs prn   Swallow/Nutrition/ Hydration     Intermittent supervision with Dys. 2 textures and thin liquids via cup   Mod I   FEES today, possible use of RMT, pharyngeal strenghtening exercises, tolerance    ADL's  Min A UB dressing, Mod A transfer, Mod A LB dressing  Mod I/supervision  NMR, transfer training, sitting/standing balance, ataxia, vision, pt/family ed   Mobility     min squat/pivot, mod/max stand pivot and gait  supervision overall  balance, NMR, coordination, transfers, gait   Communication     Hoarse vocal quality, 100% intelligible         Safety/Cognition/ Behavioral Observations             Pain      pain managed with prn tylenol and ultram  Pain <3  Assess pain q shift and prn   Skin     edema to BLE,   No new skin issues   Assess skin q shift and prn     *See Care Plan and progress notes for long and short-term goals.   Barriers to Discharge: see above     Possible Resolutions to Barriers:  cont rehab     Discharge Planning/Teaching Needs:  Home with wife who can not provide any physical care, can do supervision level only. She has been here and observed in therapies.      Team Discussion:  Goals of supervision to min assist level. Wife to get measurements of doorways to see if home wheelchair accessible. Balance is better, but dizziness is off and on. Requires cues at times. Speech to try RMT training. Sleeping better. Repeat Fee's now with head turn and chin tuck same diet. Need to see if can reach a level wife can manage him at home due to her limitations.  Revisions to Treatment Plan:  DC 4/11    Continued Need for Acute Rehabilitation Level of Care: The patient requires daily medical management by a physician with specialized training in physical medicine and rehabilitation for the following conditions: Daily direction of a multidisciplinary physical rehabilitation program to ensure safe treatment while eliciting the highest outcome that is of practical value to the patient.: Yes Daily medical management of patient stability for increased activity during participation in an intensive rehabilitation regime.: Yes Daily analysis of laboratory values and/or radiology reports with any subsequent need for medication adjustment of medical intervention for : Neurological problems;Other   Elease Hashimoto 06/28/2016, 1:05 PM       Patient ID: [name], male   DOB: 18-Sep-1945, 71 y.o.   MRN: [id number]

## 2016-06-28 NOTE — Progress Notes (Signed)
Physical Therapy Session Note  Patient Details  Name: [name] MRN: [id number] Date of Birth: 1945/04/28  Today's Date: 06/28/2016 PT Individual Time: 1130-1200 PT Individual Time Calculation (min): 30 min   Short Term Goals: Week 1:  PT Short Term Goal 1 (Week 1): Pt will transfer with min assist PT Short Term Goal 2 (Week 1): pt will ambulate 100' with LRAD and min assist PT Short Term Goal 3 (Week 1): Pt will initiate stair negotiation with PT PT Short Term Goal 4 (Week 1): Pt will propel w/c x50' with supervision  Skilled Therapeutic Interventions/Progress Updates:    no c/o pain, does report feeling more unstable today.  Session focus on sit<>stand and standing balance with weight shift R.    Pt propels w/c to and from therapy gym mod I.  Sit<>stand x2 with close supervision with RW (pt positioned with wall on L side to improve pt confidence) and pt engaged in reaching for horseshoes to R to facilitate R weight shift and return to midline with min guard.  Pt requires increased standing rest breaks on second trial due to fatigue.  Pt returned to room at end of session and positioned upright in w/c with call bell in reach and needs met.   Therapy Documentation Precautions:  Precautions Precautions: Fall Precaution Comments: dizzy, poor postural control Restrictions Weight Bearing Restrictions: No   See Function Navigator for Current Functional Status.   Therapy/Group: Individual Therapy  [name] 06/28/2016, 12:21 PM

## 2016-06-28 NOTE — Progress Notes (Signed)
Occupational Therapy Session Note  Patient Details  Name: [name] MRN: [id number] Date of Birth: [birth year]  Today's Date: 06/28/2016 OT Individual Time: 0800-0900 OT Individual Time Calculation (min): 60 min    Short Term Goals: Week 1:  OT Short Term Goal 1 (Week 1): Pt will maintain sitting balance while performing ADL task with supervision OT Short Term Goal 2 (Week 1): Pt will complete toilet transfer with min A OT Short Term Goal 3 (Week 1): Pt will tolerate standing at the sink for 2 grooming tasks with min A for balance  Skilled Therapeutic Interventions/Progress Updates:    OT treatment session focused on sitting balance, modified bathing/dressing, improved sit<>stand, and standing balance. Sup<>sit w/ ssupervision and HOB elevated. Increased time and weight shifting strategies to achieve midline and maintain midline for 5 minutes. Worked on scooting along EOB while maintaining balance w/min A to prepare for transfer. Mod A squat-pivot to L. Bathing/dressing completed with overall set-up A for UB and Mod A for LB using long handled ADL AE. Min/mod A sit<>stand w/ Mod A to maintain standing balance when reaching to pull pants over hips. Educated pt on balance strategies during standing ADLs. Pt left seated in wc at end of session with needs met.   Therapy Documentation Precautions:  Precautions Precautions: Fall Precaution Comments: dizzy, poor postural control Restrictions Weight Bearing Restrictions: No General:  Pain: Pain Assessment Pain Assessment: 0-10 Pain Score: 2  Pain Type: Acute pain Pain Location: Head Pain Descriptors / Indicators: Aching Pain Onset: On-going Pain Intervention(s): Repositioned ADL: ADL ADL Comments: Please see functional navigator Exercises:   Other Treatments:    See Function Navigator for Current Functional Status.   Therapy/Group: Individual Therapy  Valma Cava 06/28/2016, 4:28 PM

## 2016-06-29 ENCOUNTER — Inpatient Hospital Stay (HOSPITAL_COMMUNITY): Payer: Self-pay | Admitting: Physical Therapy

## 2016-06-29 ENCOUNTER — Inpatient Hospital Stay (HOSPITAL_COMMUNITY): Payer: Self-pay | Admitting: Occupational Therapy

## 2016-06-29 ENCOUNTER — Inpatient Hospital Stay (HOSPITAL_COMMUNITY): Payer: Medicare Other | Admitting: Speech Pathology

## 2016-06-29 NOTE — Progress Notes (Signed)
Occupational Therapy Weekly Progress Note  Patient Details  Name: [name] MRN: [id number] Date of Birth: 15-Oct-1945  Beginning of progress report period: June 22, 2016 End of progress report period: June 29, 2016  Today's Date: 06/29/2016  Session 1 OT Individual Time: 2585-2778 OT Individual Time Calculation (min): 46 min   Session 2 OT Individual Time: 1345-1450 OT Individual Time Calculation (min): 65 min    Patient has met 1 of 3 short term goals.  Pt is making steady progress with OT treatments at this time. He is progressing with squat-pivot transfers, requiring consistently min A to R, but Mod A to L. His sitting and standing balance have improved but pt continues to demonstrate Ataxia and diminished trunk control requiring supervision/min A for sitting balance, and min/mod A for standing. Pt is able to utilize long-handed AE to assist with LB ADL, but requires up to Mod A for dynamic standing balance when alternating unilateral UE support to pull up pants.  Pt is very motivated and is working hard in OT treatment session.  Will continue with current OT treatment POC.     Patient continues to demonstrate the following deficits: muscle weakness, impaired timing and sequencing, ataxia and decreased coordination, diplopia, decreased proprioception and decreased sitting balance, decreased standing balance, decreased postural control and decreased balance strategies and therefore will continue to benefit from skilled OT intervention to enhance overall performance with BADL.  Patient progressing toward long term goals..  Continue plan of care.  OT Short Term Goals Week 1:  OT Short Term Goal 1 (Week 1): Pt will maintain sitting balance while performing ADL task with supervision OT Short Term Goal 1 - Progress (Week 1): Met OT Short Term Goal 2 (Week 1): Pt will complete toilet transfer with min A OT Short Term Goal 2 - Progress (Week 1): Progressing toward goal OT Short Term  Goal 3 (Week 1): Pt will tolerate standing at the sink for 2 grooming tasks with min A for balance OT Short Term Goal 3 - Progress (Week 1): Progressing toward goal Week 2:  OT Short Term Goal 1 (Week 2): Pt will consistently complete toilet transfer with min A OT Short Term Goal 2 (Week 2): Pt will tolerate standing at the sink for 2 grooming tasks with min A for balance OT Short Term Goal 3 (Week 2): Pt will complete LB dressing sit<>stand with min A OT Short Term Goal 4 (Week 2): Pt will complete tub shower transfer with min A  Skilled Therapeutic Interventions/Progress Updates:     Session 1 OT treatment session focused on sitting balance, standing balance, L UE and LE coordination. B UE coordination w/ propelling w/c to therapy gym. Min A squat-pivot transfer to R. Utilized Pharmacist, hospital and weight shifting to bring pt to midline. L LE coordination with cone toe tap and coordinating numbers in sitting. Min A to correct lateral lean with increased activity demand. Sit<>stand with focus on anterior weight shift w/ min/Mod A overall. Facilitated weight shift to R using hip strategy and facilitated trunk extension w/ reaching overhead. Min/Mod A for balance when reaching-progressing to consistent min guard. Pt returned to room at end of session with needs met and lunch.   Session 2 OT treatment session focused on UB/LB there-ex, dynamic sitting balance, postural control, core strength and standing balance/ednurance. Pt brought into quadruped on therapy mat with min A rolling onto R hip. Alternating Reach 10x, then alternating hip extension in supine. Min guard A to stabilize  L side when L UE or LE acting as stabilizer. Sitting balance/ hand eye-coordination using foam pad and ball toss to R. Standing balance there- ex  focused in hip/ankle strategies by facilitating forward weight shift. Side-steps with mat raised for UE support with Mod A to maintain standing balance with step outside base of  support. Stand-pivot w/c > raised commode over toilet w/ min/mod A using grab bars. Unable to maintain standing balance to reach for self-toileting, requiring Max A for peri-care and clothing management. Pt returned to bed at end of session in similar fashion and left with needs met.  Therapy Documentation Precautions:  Precautions Precautions: Fall Precaution Comments: dizzy, poor postural control Restrictions Weight Bearing Restrictions: No Pain:  denies pain ADL: ADL ADL Comments: Please see functional navigator  See Function Navigator for Current Functional Status.   Therapy/Group: Individual Therapy  Valma Cava 06/29/2016, 4:12 PM

## 2016-06-29 NOTE — Progress Notes (Signed)
Speech Language Pathology Weekly Progress and Session Note  Patient Details  Name: [name] MRN: [id number] Date of Birth: Sep 02, 1945  Beginning of progress report period: June 22, 2016 End of progress report period: June 29, 2016  Today's Date: 06/29/2016 SLP Individual Time: 0730-0825 SLP Individual Time Calculation (min): 55 min  Short Term Goals: Week 1: SLP Short Term Goal 1 (Week 1): Patient will consume current diet with minimal overt s/s of aspiration with Mod I.  SLP Short Term Goal 1 - Progress (Week 1): Not met SLP Short Term Goal 2 (Week 1): Patient will consume trials of Dys. 3 textures without overt s/s of aspiration or reports of pharyngeal residuals over 2 sessions with supervision verbal cues prior to upgrde.  SLP Short Term Goal 2 - Progress (Week 1): Not met SLP Short Term Goal 3 (Week 1): Patient will recall and utilize left head turn and tuck with thin liquid sips with minimal overt s/s of aspiration with Supervision level verbal cues.  SLP Short Term Goal 3 - Progress (Week 1): Met    New Short Term Goals: Week 2: SLP Short Term Goal 1 (Week 2): Patient will recall and utilize left head turn and tuck with thin liquid sips with minimal overt s/s of aspiration with Supervision level verbal cues.  SLP Short Term Goal 2 (Week 2): Patient will consume trials of Dys. 3 textures without overt s/s of aspiration or reports of pharyngeal residuals over 2 sessions with supervision verbal cues prior to upgrde.  SLP Short Term Goal 3 (Week 2): Patient will consume current diet with minimal overt s/s of aspiration with Mod I.   Weekly Progress Updates: Patient had made functional gains and has met 1 of 3 STG's this reporting period. Patient has been consuming Dys. 2 textures with thin liquids with increased s/s of aspiration. Therefore, patient was administered a FEES to assess swallow function which showed aspiration of thin liquids. However, aspiration was prevented with  use of head turn to the left with a chin tuck. Patient is currently utilizing swallowing strategies with intermittent supervision and consuming trials of Dys. 3 textures with efficient mastication. Patient education ongoing. Patient would benefit from continued skilled SLP intervention to maximize his swallowing function and independence prior to discharge.      Intensity: Minumum of 1-2 x/day, 30 to 90 minutes Frequency: 3 to 5 out of 7 days Duration/Length of Stay: 4/11 Treatment/Interventions: Dysphagia/aspiration precaution training;Speech/Language facilitation;Therapeutic Exercise;Patient/family education;Therapeutic Activities   Daily Session  Skilled Therapeutic Interventions: Skilled treatment session focused on dysphagia goals. SLP facilitated session by providing intermittent supervision verbal cues for use of head turn to the left with chin tuck in a mildly distracting environment while consuming his breakfast meal of Dys. 2 textures with thin liquids via cup. Patient demonstrated overt cough X 2, suspect due to incoordination of swallow initiation with head turn with liquids. Overall, patient demonstrates improved swallowing function and "sensation" while swallowing. Patient also consumed trials of Dys. 3 textures and demonstrated efficient mastication without overt s/s of aspiration. Recommend trial tray prior to upgrade. Patient left supine in bed with all needs within reach. Continue with current plan of care.         Function:   Eating Eating   Modified Consistency Diet: Yes Eating Assist Level: More than reasonable amount of time;Set up assist for;Supervision or verbal cues   Eating Set Up Assist For: Opening containers       Cognition Comprehension Comprehension assist level: Follows  complex conversation/direction with no assist  Expression   Expression assist level: Expresses complex 90% of the time/cues < 10% of the time  Social Interaction Social Interaction assist  level: Interacts appropriately 90% of the time - Needs monitoring or encouragement for participation or interaction.  Problem Solving Problem solving assist level: Solves complex problems: With extra time  Memory Memory assist level: More than reasonable amount of time   Pain No reports of pain   Therapy/Group: Individual Therapy  Amayia Ciano 06/29/2016, 12:55 PM

## 2016-06-29 NOTE — Progress Notes (Signed)
Subjective/Complaints:  Discussed with RN , fell last noc  ROS: pt denies nausea, vomiting, diarrhea, cough, shortness of breath or chest pain   Objective: Vital Signs: Blood pressure (!) 144/73, pulse 72, temperature 98 F (36.7 C), temperature source Oral, resp. rate 20, height 6' (1.829 m), weight (!) 153.1 kg (337 lb 8 oz), SpO2 99 %. No results found. No results found for this or any previous visit (from the past 72 hour(s)).   HEENT: vertical diplopia in all visual fields, AT, [place] Cardio: RRR Resp: CTA B/L GI: ND, BS+ Extremity:  No Edema Skin:   Intact Neuro: Alert/Oriented, Normal Sensory and Other 5/5 BUE and 4/5 BLE, voice is hoarse esp after swallowing solids Musc/Skel:  Normal, no pain with ROM of UE or LE Gen NAD, obese   Assessment/Plan: 1. 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 Physiatrist is providing close team supervision and 24 hour management of active medical problems listed below. Physiatrist and rehab team continue to assess barriers to discharge/monitor patient progress toward functional and medical goals. FIM: Function - Bathing Position: Shower Body parts bathed by patient: Right arm, Left arm, Chest, Abdomen, Left lower leg, Right lower leg, Left upper leg, Front perineal area, Right upper leg Body parts bathed by helper: Buttocks Assist Level: Touching or steadying assistance(Pt > 75%)  Function- Upper Body Dressing/Undressing What is the patient wearing?: Pull over shirt/dress Pull over shirt/dress - Perfomed by patient: Thread/unthread right sleeve, Thread/unthread left sleeve, Put head through opening Pull over shirt/dress - Perfomed by helper: Pull shirt over trunk Assist Level: Touching or steadying assistance(Pt > 75%), Assistive device Assistive Device Comment: long handled sponge Function - Lower Body  Dressing/Undressing What is the patient wearing?: Pants, Underwear Position: Wheelchair/chair at sink Underwear - Performed by patient: Thread/unthread right underwear leg, Thread/unthread left underwear leg Underwear - Performed by helper: Pull underwear up/down Pants- Performed by patient: Thread/unthread right pants leg, Thread/unthread left pants leg Pants- Performed by helper: Pull pants up/down Non-skid slipper socks- Performed by helper: Don/doff right sock, Don/doff left sock Assist for footwear: Dependant Assist for lower body dressing: Assistive device (Mod A) Assistive Device Comment: reacher, sock-aid  Function - Toileting Toileting activity did not occur: No continent bowel/bladder event Toileting steps completed by patient: Adjust clothing after toileting, Adjust clothing prior to toileting, Performs perineal hygiene Toileting steps completed by helper: Adjust clothing prior to toileting, Performs perineal hygiene, Adjust clothing after toileting Toileting Assistive Devices: Grab bar or rail Assist level: Set up/obtain supplies  Function - Air cabin crew transfer activity did not occur: Safety/medical concerns Toilet transfer assistive device: Elevated toilet seat/BSC over toilet, Grab bar Assist level to toilet: 2 helpers Assist level from toilet: 2 helpers  Function - Chair/bed transfer Chair/bed transfer method: Squat pivot Chair/bed transfer assist level: Touching or steadying assistance (Pt > 75%) Chair/bed transfer assistive device: Armrests Chair/bed transfer details: Verbal cues for precautions/safety  Function - Locomotion: Wheelchair Will patient use wheelchair at discharge?: Yes Type: Manual Max wheelchair distance: 150 Assist Level: No help, No cues, assistive device, takes more than reasonable amount of time Assist Level: No help, No cues, assistive device, takes more than reasonable amount of time Assist Level: No help, No cues, assistive  device, takes more than reasonable amount of time Turns around,maneuvers to table,bed, and toilet,negotiates 3% grade,maneuvers on rugs and over doorsills: No Function - Locomotion: Ambulation Assistive device: Walker-rolling Max distance:  25 Assist level: Touching or steadying assistance (Pt > 75%) Assist level: Touching or steadying assistance (Pt > 75%) Walk 50 feet with 2 turns activity did not occur: Safety/medical concerns Walk 150 feet activity did not occur: Safety/medical concerns Walk 10 feet on uneven surfaces activity did not occur: Safety/medical concerns  Function - Comprehension Comprehension: Auditory Comprehension assistive device: Hearing aids Comprehension assist level: Follows complex conversation/direction with no assist  Function - Expression Expression: Verbal Expression assist level: Expresses complex 90% of the time/cues < 10% of the time  Function - Social Interaction Social Interaction assist level: Interacts appropriately 90% of the time - Needs monitoring or encouragement for participation or interaction.  Function - Problem Solving Problem solving assist level: Solves complex problems: With extra time  Function - Memory Memory assist level: More than reasonable amount of time Patient normally able to recall (first 3 days only): Current season, Location of own room, Staff names and faces, That he or she is in a hospital  Medical Problem List and Plan: 1.  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  DVT Prophylaxis/Anticoagulation: Eliquis 3. Pain Management: Tylenol as needed, consider cont prn Tramadol 4. Mood: Risperdal 0.5 mg daily at bedtime, Zoloft 150 mg daily--stable at present 5. Neuropsych: This patient is capable of making decisions on his own behalf. 6. Skin/Wound Care: Routine  skin checks 7. Fluids/Electrolytes/Nutrition: Routine I&O with follow-up chemistries 8. Dysphagia. Mechanical soft thin liquid diet. Follow-up speech therapy. Monitor for any signs of aspiration 9. Atrial fibrillation with history of pulmonary emboli. Tambocor 50 mg every 12 hours. Cardiac rate controlled. 10. OSA/CPAP. Check oxygen saturations every shift 11. Diastolic congestive heart failure. Monitor for any signs of fluid overload. Lasix 40 mg daily  -needs regular weights 12. Hypertension: Monitor BP 13. Morbid obesity. BMI 48.56. Dietary follow-up,14. BPH. Proscar 5 mg daily.   -emptying with low pvr's    15. Hyperlipidemia. Lipitor 16. History of bilateral TKA. Patient used a Programmer, multimedia prior to admission 17.  Constipation-uses dulcolax at home -senokot -s bid  -sorbitol prn   LOS (Days) 8 A FACE TO FACE EVALUATION WAS PERFORMED  Karolina Zamor E 06/29/2016, 7:10 AM

## 2016-06-29 NOTE — Progress Notes (Signed)
   06/29/16 0430  What Happened  Was fall witnessed? No  Was patient injured? Unsure  Patient found on floor  Found by Staff-comment Roselyn Reef, RN)  Stated prior activity other (comment) ("I was having a bad dream")  Follow Up  MD notified Danella Sensing  Time MD notified [phone]  Family notified No- patient refusal (patient requested for this RN to wait until 0630 to call)  Additional tests No  Vitals  Temp 98.1 F (36.7 C)  Temp Source Oral  BP 118/73  BP Location Left Wrist  BP Method Automatic  Patient Position (if appropriate) Lying  Pulse Rate 75  Pulse Rate Source Dinamap  Resp 18  Oxygen Therapy  SpO2 98 %  O2 Device Room Air  Pain Assessment  Pain Assessment No/denies pain  Neurological  Neuro (WDL) X (poor balance, double vision)  Level of Consciousness Alert  Orientation Level Oriented X4  Cognition Appropriate at baseline  Speech Clear;Other (Comment) (hoarse)  Pupil Assessment  Yes  R Pupil Size (mm) 3  R Pupil Shape Round  R Pupil Reaction Brisk  L Pupil Size (mm) 3  L Pupil Shape Round  L Pupil Reaction Brisk  R Hand Grip Moderate  L Hand Grip Moderate   Neuro Symptoms None  Neuro Additional Assessments No  Patient reports having a "bad dream". Also stated, for the past 10-12 years he has been sleep walking. Patient "thinks" he landed on his left side, staff found patient laying on his back, beside of bed. No obvious signs of injuries. Danella Sensing, on call, notified of fall. Instructed to monitor per policy and report changes. [name]

## 2016-06-29 NOTE — Progress Notes (Signed)
Physical Therapy Session Note  Patient Details  Name: [name] MRN: [id number] Date of Birth: 04-19-1945  Today's Date: 06/29/2016 PT Individual Time: 0900-1000 PT Individual Time Calculation (min): 60 min   Short Term Goals: Week 1:  PT Short Term Goal 1 (Week 1): Pt will transfer with min assist PT Short Term Goal 2 (Week 1): pt will ambulate 100' with LRAD and min assist PT Short Term Goal 3 (Week 1): Pt will initiate stair negotiation with PT PT Short Term Goal 4 (Week 1): Pt will propel w/c x50' with supervision  Skilled Therapeutic Interventions/Progress Updates:    no c/o pain.  Session focus on transfers and gait training in controlled environment with visual feedback.   Pt transitions supine>sit and dons shoes EOB with supervision.  Squat/pivot to and from w/c with min guard.  Gait training x3 trials [phone] with rest breaks in between trials, using mirror for visual feedback and min assist.  Pt ambulates with wide BOS and decreased ataxia with LLE, however continues to demo occasional L lateral lean which he is able to correct with up to min cues.  Pt completed ambulatory transfer to standard height arm chair on third gait trial with mod/max assist to maintain midline and sequence turning to sit.  Pt performs stand/pivot from standard chair to w/c with min assist and requiring decreased cues.  Returned to room at end of session and left upright in w/c in with call bell in reach and needs met.   Therapy Documentation Precautions:  Precautions Precautions: Fall Precaution Comments: dizzy, poor postural control Restrictions Weight Bearing Restrictions: No   See Function Navigator for Current Functional Status.   Therapy/Group: Individual Therapy  Allayah Raineri E Penven-Crew 06/29/2016, 11:45 AM

## 2016-06-30 ENCOUNTER — Inpatient Hospital Stay (HOSPITAL_COMMUNITY): Payer: Self-pay | Admitting: Physical Therapy

## 2016-06-30 ENCOUNTER — Inpatient Hospital Stay (HOSPITAL_COMMUNITY): Payer: Self-pay | Admitting: Occupational Therapy

## 2016-06-30 ENCOUNTER — Inpatient Hospital Stay (HOSPITAL_COMMUNITY): Payer: Medicare Other | Admitting: Speech Pathology

## 2016-06-30 DIAGNOSIS — G463 Brain stem stroke syndrome: Secondary | ICD-10-CM | POA: Diagnosis present

## 2016-06-30 DIAGNOSIS — I69393 Ataxia following cerebral infarction: Principal | ICD-10-CM

## 2016-06-30 HISTORY — DX: Brain stem stroke syndrome: G46.3

## 2016-06-30 NOTE — Progress Notes (Signed)
Speech Language Pathology Daily Session Note  Patient Details  Name: [name] MRN: [id number] Date of Birth: 1945-07-12  Today's Date: 06/30/2016 SLP Individual Time: 1130-1200 SLP Individual Time Calculation (min): 30 min  Short Term Goals: Week 2: SLP Short Term Goal 1 (Week 2): Patient will recall and utilize left head turn and tuck with thin liquid sips with minimal overt s/s of aspiration with Supervision level verbal cues.  SLP Short Term Goal 2 (Week 2): Patient will consume trials of Dys. 3 textures without overt s/s of aspiration or reports of pharyngeal residuals over 2 sessions with supervision verbal cues prior to upgrde.  SLP Short Term Goal 3 (Week 2): Patient will consume current diet with minimal overt s/s of aspiration with Mod I.   Skilled Therapeutic Interventions: Skilled treatment session focused on dysphagia goals. SLP facilitated session by providing Min A for use of left head turn and tuck with trial lunch tray of dysphagia 3 and thin liquids. Pt with intermittent throat clear during all consumption of chicken noodle soup, suspect d/t mixed consistency. Recommend allowing upgrade to dysphagia 3 with thin liquids - no mixed consistencies. Pt left in bed, bed alarm on and all needs within reach. Continue per current plan of care.      Function:  Eating Eating   Modified Consistency Diet: Yes Eating Assist Level: More than reasonable amount of time;Set up assist for;Supervision or verbal cues           Cognition Comprehension Comprehension assist level: Follows basic conversation/direction with no assist  Expression   Expression assist level: Expresses basic needs/ideas: With no assist  Social Interaction Social Interaction assist level: Interacts appropriately with others with medication or extra time (anti-anxiety, antidepressant).  Problem Solving Problem solving assist level: Solves basic problems with no assist  Memory Memory assist level: More than  reasonable amount of time    Pain    Therapy/Group: Individual Therapy   Junaid Wurzer B. Rutherford Nail, M.S., CCC-SLP Speech-Language Pathologist   Kyle Luppino 06/30/2016, 12:44 PM

## 2016-06-30 NOTE — Progress Notes (Signed)
Physical Therapy Session Note  Patient Details  Name: [name] MRN: [id number] Date of Birth: Mar 11, 1946  Today's Date: 06/30/2016 PT Individual Time: 4010-2725 PT Individual Time Calculation (min): 40 min   Short Term Goals: Week 2:  PT Short Term Goal 1 (Week 2): Pt will reorient to midline with min verbal cues in 50% of observed opportunities PT Short Term Goal 2 (Week 2): Pt will ambulate 100' with LRAD and mod assist PT Short Term Goal 3 (Week 2): Pt will negotiate 4 steps with 1 rail and mod assist  Skilled Therapeutic Interventions/Progress Updates:    no c/o pain.  Pt propels w/c throughout therapy unit mod I.  Gait training x50' +20' with RW, min assist overall, with min cues for midline orientation when turning.  Nustep x11 minutes with 4 extremities at level 4 for reciprocal stepping pattern, and cardiovascular endurance.  Pt returned to bed at end of session with supervision, call bell in reach and needs met.   Therapy Documentation Precautions:  Precautions Precautions: Fall Precaution Comments: dizzy, poor postural control Restrictions Weight Bearing Restrictions: No   See Function Navigator for Current Functional Status.   Therapy/Group: Individual Therapy  [name] 06/30/2016, 5:17 PM

## 2016-06-30 NOTE — Progress Notes (Signed)
Subjective/Complaints:  occ hiccups , still clumsy on left , wears eye patch for double vision , no nausea  ROS: pt denies nausea, vomiting, diarrhea, cough, shortness of breath or chest pain   Objective: Vital Signs: Blood pressure (!) 141/61, pulse 62, temperature 97.9 F (36.6 C), temperature source Oral, resp. rate 20, height 6' (1.829 m), weight (!) 155 kg (341 lb 11.2 oz), SpO2 98 %. No results found. No results found for this or any previous visit (from the past 72 hour(s)).   HEENT: vertical diplopia in all visual fields, AT, [place] Cardio: RRR Resp: CTA B/L GI: ND, BS+ Extremity:  No Edema Skin:   Intact Neuro: Alert/Oriented, Normal Sensory and Other 5/5 BUE and 4/5 BLE, voice is hoarse esp after swallowing solids Musc/Skel:  Normal, no pain with ROM of UE or LE Gen NAD, obese   Assessment/Plan: 1. 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 Physiatrist is providing close team supervision and 24 hour management of active medical problems listed below. Physiatrist and rehab team continue to assess barriers to discharge/monitor patient progress toward functional and medical goals. FIM: Function - Bathing Position: Shower Body parts bathed by patient: Right arm, Left arm, Chest, Abdomen, Left lower leg, Right lower leg, Left upper leg, Front perineal area, Right upper leg Body parts bathed by helper: Buttocks Assist Level: Touching or steadying assistance(Pt > 75%)  Function- Upper Body Dressing/Undressing What is the patient wearing?: Pull over shirt/dress Pull over shirt/dress - Perfomed by patient: Thread/unthread right sleeve, Thread/unthread left sleeve, Put head through opening Pull over shirt/dress - Perfomed by helper: Pull shirt over trunk Assist Level: Touching or steadying assistance(Pt > 75%), Assistive device Assistive Device Comment: long  handled sponge Function - Lower Body Dressing/Undressing What is the patient wearing?: Pants, Underwear Position: Wheelchair/chair at sink Underwear - Performed by patient: Thread/unthread right underwear leg, Thread/unthread left underwear leg Underwear - Performed by helper: Pull underwear up/down Pants- Performed by patient: Thread/unthread right pants leg, Thread/unthread left pants leg Pants- Performed by helper: Pull pants up/down Non-skid slipper socks- Performed by helper: Don/doff right sock, Don/doff left sock Assist for footwear: Dependant Assist for lower body dressing: Assistive device (Mod A) Assistive Device Comment: reacher, sock-aid  Function - Toileting Toileting activity did not occur: No continent bowel/bladder event Toileting steps completed by patient: Adjust clothing after toileting, Adjust clothing prior to toileting, Performs perineal hygiene Toileting steps completed by helper: Adjust clothing prior to toileting, Performs perineal hygiene, Adjust clothing after toileting Toileting Assistive Devices: Grab bar or rail Assist level: Set up/obtain supplies  Function - Air cabin crew transfer activity did not occur: Safety/medical concerns Toilet transfer assistive device: Elevated toilet seat/BSC over toilet, Grab bar Assist level to toilet: 2 helpers Assist level from toilet: 2 helpers  Function - Chair/bed transfer Chair/bed transfer method: Squat pivot Chair/bed transfer assist level: Touching or steadying assistance (Pt > 75%) Chair/bed transfer assistive device: Armrests Chair/bed transfer details: Verbal cues for precautions/safety  Function - Locomotion: Wheelchair Will patient use wheelchair at discharge?: Yes Type: Manual Max wheelchair distance: 150 Assist Level: No help, No cues, assistive device, takes more than reasonable amount of time Assist Level: No help, No cues, assistive device, takes more than reasonable amount of time Assist  Level: No help, No cues, assistive device, takes more than reasonable amount of time Turns around,maneuvers to table,bed, and toilet,negotiates 3% grade,maneuvers on rugs and over doorsills:  No Function - Locomotion: Ambulation Assistive device: Walker-rolling Max distance: 25 Assist level: Touching or steadying assistance (Pt > 75%) Assist level: Touching or steadying assistance (Pt > 75%) Walk 50 feet with 2 turns activity did not occur: Safety/medical concerns Walk 150 feet activity did not occur: Safety/medical concerns Walk 10 feet on uneven surfaces activity did not occur: Safety/medical concerns  Function - Comprehension Comprehension: Auditory Comprehension assistive device: Hearing aids Comprehension assist level: Follows complex conversation/direction with no assist  Function - Expression Expression: Verbal Expression assist level: Expresses complex 90% of the time/cues < 10% of the time  Function - Social Interaction Social Interaction assist level: Interacts appropriately 90% of the time - Needs monitoring or encouragement for participation or interaction.  Function - Problem Solving Problem solving assist level: Solves complex problems: With extra time  Function - Memory Memory assist level: More than reasonable amount of time Patient normally able to recall (first 3 days only): Current season, Location of own room, Staff names and faces, That he or she is in a hospital  Medical Problem List and Plan: 1.  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  DVT Prophylaxis/Anticoagulation: Eliquis 3. Pain Management: Tylenol as needed, consider cont prn Tramadol 4. Mood: Risperdal 0.5 mg daily at bedtime, Zoloft 150 mg daily--stable at present 5. Neuropsych: This patient is capable of making decisions on his own  behalf. 6. Skin/Wound Care: Routine skin checks 7. Fluids/Electrolytes/Nutrition: Routine I&O with follow-up chemistries 8. Dysphagia. Mechanical soft thin liquid diet. Follow-up speech therapy. Monitor for any signs of aspiration 9. Atrial fibrillation with history of pulmonary emboli. Tambocor 50 mg every 12 hours. Cardiac rate controlled. 10. OSA/CPAP. Check oxygen saturations every shift 11. Diastolic congestive heart failure. Monitor for any signs of fluid overload. Lasix 40 mg daily   12. Hypertension: Monitor BP, cont Lasix Vitals:   06/30/16 0212 06/30/16 0440  BP: 119/65 (!) 141/61  Pulse: (!) 57 62  Resp: 20 20  Temp:  97.9 F (36.6 C)   13. Morbid obesity. BMI 48.56. Dietary follow-up,14. BPH. Proscar 5 mg daily.   -emptying with low pvr's    15. Hyperlipidemia. Lipitor 16. History of bilateral TKA. Patient used a Programmer, multimedia prior to admission 17.  Constipation-uses dulcolax at home -senokot -s bid  -sorbitol prn   LOS (Days) 9 A FACE TO FACE EVALUATION WAS PERFORMED  KIRSTEINS,ANDREW E 06/30/2016, 8:14 AM

## 2016-06-30 NOTE — Progress Notes (Signed)
Patient to self-administer CPAP using his machine from home.  Patient is familiar with equipment and procedure.  

## 2016-06-30 NOTE — Progress Notes (Signed)
Occupational Therapy Session Note  Patient Details  Name: [name] MRN: [id number] Date of Birth: 1945-06-11  Today's Date: 06/30/2016 OT Individual Time: 1300-1400 OT Individual Time Calculation (min): 60 min   Short Term Goals: Week 2:  OT Short Term Goal 1 (Week 2): Pt will consistently complete toilet transfer with min A OT Short Term Goal 2 (Week 2): Pt will tolerate standing at the sink for 2 grooming tasks with min A for balance OT Short Term Goal 3 (Week 2): Pt will complete LB dressing sit<>stand with min A OT Short Term Goal 4 (Week 2): Pt will complete tub shower transfer with min A  Skilled Therapeutic Interventions/Progress Updates:    OT treatment session focused on functional transfer training, balance strategies, modified bathing/dressing, and toileting strategies. Stand-pivot transfers wc>tub bench>toilet with overall mod A to pivot 2/2 L ataxia. Pt utilized long-handled sponge to wash LB and buttocks today but required min up to Mod A to maintain standing balance. Facilitated anterior weight shift with cues for LLE positioning in standing. Reacher utilized to thread B LE's through pant legs, min A sit<>stand with cues for balance strategies using unilateral UE support while pulling up pants.Pt propelled wc to EOB and was able to don socks and shoes by propping feet onto EOB with set-up.   Stand-pivot transfer to toilet with Mod A using grab bars with assistance for clothing management. Successful BM, then OT demonstrated use of toilet aid to increase reach 2/2 body habitus. Pt attempted to toilet but was unable to complete task thoroughly requiring Max A for peri-care and clothing management. Pt returned to wc in similar fashion and left with needs met and family present.   Therapy Documentation Precautions:  Precautions Precautions: Fall Precaution Comments: dizzy, poor postural control Restrictions Weight Bearing Restrictions: No Pain:  denies  pain ADL: ADL ADL Comments: Please see functional navigator Exercises:   Other Treatments:    See Function Navigator for Current Functional Status.   Therapy/Group: Individual Therapy  Valma Cava 06/30/2016, 1:52 PM

## 2016-06-30 NOTE — Progress Notes (Signed)
Physical Therapy Weekly Progress Note  Patient Details  Name: [name] MRN: [id number] Date of Birth: February 24, 1946  Beginning of progress report period: June 22, 2016 End of progress report period: June 30, 2016  Today's Date: 06/30/2016 PT Individual Time: 0900-1000 PT Individual Time Calculation (min): 60 min   Patient has met 3 of 4 short term goals.  Pt has made excellent progress towards goals and is currently able to transfer with consistent min assist.  Pt continues to demonstrate decreased coordination with all functional mobility, especially ambulation.  Pt reports feeling "off balance" or that he feels he's leaning towards the L without visual feedback, even when he is in midline.    Patient continues to demonstrate the following deficits muscle weakness, impaired timing and sequencing, unbalanced muscle activation and decreased coordination, decreased midline orientation and decreased sitting balance, decreased standing balance, decreased postural control and decreased balance strategies and therefore will continue to benefit from skilled PT intervention to increase functional independence with mobility.  Patient progressing toward long term goals..  Continue plan of care.  PT Short Term Goals Week 1:  PT Short Term Goal 1 (Week 1): Pt will transfer with min assist PT Short Term Goal 1 - Progress (Week 1): Met PT Short Term Goal 2 (Week 1): pt will ambulate 100' with LRAD and min assist PT Short Term Goal 2 - Progress (Week 1): Progressing toward goal PT Short Term Goal 3 (Week 1): Pt will initiate stair negotiation with PT PT Short Term Goal 3 - Progress (Week 1): Met PT Short Term Goal 4 (Week 1): Pt will propel w/c x50' with supervision PT Short Term Goal 4 - Progress (Week 1): Met Week 2:  PT Short Term Goal 1 (Week 2): Pt will reorient to midline with min verbal cues in 50% of observed opportunities PT Short Term Goal 2 (Week 2): Pt will ambulate 100' with LRAD and  mod assist PT Short Term Goal 3 (Week 2): Pt will negotiate 4 steps with 1 rail and mod assist  Skilled Therapeutic Interventions/Progress Updates:    no c/o pain, but reports feeling like he's leaning more to the L today.  Pt noted to be in midline at least 75% of today's session, even without visual confirmation from mirror.  Session focus on trunk NMR, sitting/standing balance, and transfers with RW.   Pt transitions supine>sitting EOB mod I and dons shoes with set up assist.  Squat/pivot to w/c on R with supervision and pt propels w/c to gym mod I.  Squat/pivot to mat with steady assist to L.  Pt engaged in reaching task for cups to L and R and stacking them across midline focus on trunk elongation, shortening, and rotation.  Standing balance focus on R weight shift and return to midline without visual feedback from mirror during card matching activity.  Blocked practice for stand/pivot transfers to L and R (3 trials each direction) focus on coordination and motor control.  Pt returned to room in w/c in same manner as above and left with call bell in reach and needs met.   Therapy Documentation Precautions:  Precautions Precautions: Fall Precaution Comments: dizzy, poor postural control Restrictions Weight Bearing Restrictions: No   See Function Navigator for Current Functional Status.  Therapy/Group: Individual Therapy  Demetries Coia E Penven-Crew 06/30/2016, 9:35 AM

## 2016-07-01 ENCOUNTER — Inpatient Hospital Stay (HOSPITAL_COMMUNITY): Payer: Self-pay

## 2016-07-01 ENCOUNTER — Inpatient Hospital Stay (HOSPITAL_COMMUNITY): Payer: Medicare Other | Admitting: Speech Pathology

## 2016-07-01 NOTE — Progress Notes (Signed)
Occupational Therapy Session Note  Patient Details  Name: [name] MRN: [id number] Date of Birth: 1946/03/07  Today's Date: 07/01/2016 OT Individual Time: 1300-1357 OT Individual Time Calculation (min): 57 min    Short Term Goals: Week 2:  OT Short Term Goal 1 (Week 2): Pt will consistently complete toilet transfer with min A OT Short Term Goal 2 (Week 2): Pt will tolerate standing at the sink for 2 grooming tasks with min A for balance OT Short Term Goal 3 (Week 2): Pt will complete LB dressing sit<>stand with min A OT Short Term Goal 4 (Week 2): Pt will complete tub shower transfer with min A  Skilled Therapeutic Interventions/Progress Updates:   1:1 session with focus on standing tolerance, balance, FMC, and functional reach. Pt squat pivot transfer w/c<>EOB with MIN A and VC for safety awareness. Pt stands at sink to brush teeth and hair with CGA and VC for foot placement  At shoulder width apart. Pt requires seated rest break and while seated shaves face with electric razor. Pt propels w/c to/from all therapeutic destinations with increased time to improve BUE strength and endurance. Pt wears eye patch over L eye andPt stands at dynavision for 2 trials with MIN A for sit to stand/standing balance while reaching in all directions with unilateral support on RW for 2 min. To improve [place] and standing balance required for ADLs,  Pt fastens/unfastens large and small buttons, laces shoestring, and assembles/plays game of jenga in standing with MIN A for balance with seated rest breaks as needed. Exited session with pt seated in w/c with call light in reach and all needs met.     Therapy Documentation Precautions:  Precautions Precautions: Fall Precaution Comments: dizzy, poor postural control Restrictions Weight Bearing Restrictions: No See Function Navigator for Current Functional Status.   Therapy/Group: Individual Therapy  Tonny Branch 07/01/2016, 3:59 PM

## 2016-07-01 NOTE — Progress Notes (Signed)
Speech Language Pathology Daily Session Note  Patient Details  Name: [name] MRN: [id number] Date of Birth: 06-17-1945  Today's Date: 07/01/2016 SLP Individual Time: 1130-1200 SLP Individual Time Calculation (min): 30 min  Short Term Goals: Week 2: SLP Short Term Goal 1 (Week 2): Patient will recall and utilize left head turn and tuck with thin liquid sips with minimal overt s/s of aspiration with Supervision level verbal cues.  SLP Short Term Goal 2 (Week 2): Patient will consume trials of Dys. 3 textures without overt s/s of aspiration or reports of pharyngeal residuals over 2 sessions with supervision verbal cues prior to upgrde.  SLP Short Term Goal 3 (Week 2): Patient will consume current diet with minimal overt s/s of aspiration with Mod I.   Skilled Therapeutic Interventions: Skilled treatment session focused on dysphagia goals. SLP facilitated session by providing intermittent supervision verbal cues for coordination of the head turn and tuck to the left with swallow initiation. Patient reported current solids/liquids were going "well" but demonstrated intermittent throat clearing. Patient reports throat clearing is baseline and has increased due to not being on his regular allergy medications (takes 2 separate allergy medications year round at home). Patient also appeared to demonstrate increased hoarseness this session. Educated patient on need to discuss medications with physician, he verbalized understanding. Patient left sitting EOB to complete meal. Continue with current plan of care.       Function:  Eating Eating   Modified Consistency Diet: Yes Eating Assist Level: Supervision or verbal cues           Cognition Comprehension Comprehension assist level: Follows basic conversation/direction with no assist  Expression   Expression assist level: Expresses basic needs/ideas: With no assist  Social Interaction Social Interaction assist level: Interacts  appropriately with others with medication or extra time (anti-anxiety, antidepressant).  Problem Solving Problem solving assist level: Solves basic problems with no assist  Memory Memory assist level: More than reasonable amount of time    Pain No/denies pain   Therapy/Group: Individual Therapy  [name] 07/01/2016, 12:47 PM

## 2016-07-01 NOTE — Progress Notes (Signed)
Subjective/Complaints:  No issues overnite  ROS: pt denies nausea, vomiting, diarrhea, cough, shortness of breath or chest pain   Objective: Vital Signs: Blood pressure 126/66, pulse (!) 55, temperature 97.6 F (36.4 C), temperature source Oral, resp. rate 18, height 6' (1.829 m), weight (!) 155.3 kg (342 lb 6.4 oz), SpO2 97 %. No results found. No results found for this or any previous visit (from the past 72 hour(s)).   HEENT: vertical diplopia in all visual fields, AT, [place] Cardio: RRR Resp: CTA B/L GI: ND, BS+ Extremity:  No Edema Skin:   Intact Neuro: Alert/Oriented, Normal Sensory and Other 5/5 BUE and 4/5 BLE, voice is hoarse esp after swallowing solids Musc/Skel:  Normal, no pain with ROM of UE or LE Gen NAD, obese   Assessment/Plan: 1. 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 Physiatrist is providing close team supervision and 24 hour management of active medical problems listed below. Physiatrist and rehab team continue to assess barriers to discharge/monitor patient progress toward functional and medical goals. FIM: Function - Bathing Position: Shower Body parts bathed by patient: Right arm, Abdomen, Left arm, Chest, Front perineal area, Right upper leg, Left upper leg, Right lower leg, Left lower leg Body parts bathed by helper: Buttocks Assist Level: Touching or steadying assistance(Pt > 75%), Assistive device Assistive Device Comment: Long handled sponge  Function- Upper Body Dressing/Undressing What is the patient wearing?: Pull over shirt/dress Pull over shirt/dress - Perfomed by patient: Thread/unthread right sleeve, Thread/unthread left sleeve, Pull shirt over trunk, Put head through opening Pull over shirt/dress - Perfomed by helper: Pull shirt over trunk Assist Level: Supervision or verbal cues, Set up Assistive Device Comment: long  handled sponge Set up : To obtain clothing/put away Function - Lower Body Dressing/Undressing What is the patient wearing?: Underwear, Pants, Socks, Shoes Position: Wheelchair/chair at sink Underwear - Performed by patient: Thread/unthread right underwear leg, Thread/unthread left underwear leg Underwear - Performed by helper: Pull underwear up/down Pants- Performed by patient: Thread/unthread left pants leg, Thread/unthread right pants leg Pants- Performed by helper: Pull pants up/down Non-skid slipper socks- Performed by helper: Don/doff right sock, Don/doff left sock Socks - Performed by patient: Don/doff right sock, Don/doff left sock Shoes - Performed by patient: Don/doff left shoe, Don/doff right shoe, Fasten right, Fasten left Assist for footwear: Setup, Supervision/touching assist Assist for lower body dressing: Assistive device Assistive Device Comment: reacher  Function - Toileting Toileting activity did not occur: No continent bowel/bladder event Toileting steps completed by patient: Adjust clothing after toileting, Adjust clothing prior to toileting, Performs perineal hygiene Toileting steps completed by helper: Adjust clothing prior to toileting, Performs perineal hygiene, Adjust clothing after toileting Toileting Assistive Devices: Grab bar or rail Assist level: Touching or steadying assistance (Pt.75%)  Function - Air cabin crew transfer activity did not occur: Safety/medical concerns Toilet transfer assistive device: Elevated toilet seat/BSC over toilet, Grab bar Assist level to toilet: Moderate assist (Pt 50 - 74%/lift or lower) Assist level from toilet: Moderate assist (Pt 50 - 74%/lift or lower)  Function - Chair/bed transfer Chair/bed transfer method: Stand pivot, Squat pivot Chair/bed transfer assist level: Touching or steadying assistance (Pt > 75%) Chair/bed transfer assistive device: Armrests, Walker Chair/bed transfer details: Verbal cues for  precautions/safety  Function - Locomotion: Wheelchair Will patient use wheelchair at discharge?: Yes Type: Manual Max wheelchair distance: 150 Assist Level: No help, No cues, assistive device, takes more than  reasonable amount of time Assist Level: No help, No cues, assistive device, takes more than reasonable amount of time Assist Level: No help, No cues, assistive device, takes more than reasonable amount of time Turns around,maneuvers to table,bed, and toilet,negotiates 3% grade,maneuvers on rugs and over doorsills: No Function - Locomotion: Ambulation Assistive device: Walker-rolling Max distance: 25 Assist level: Touching or steadying assistance (Pt > 75%) Assist level: Touching or steadying assistance (Pt > 75%) Walk 50 feet with 2 turns activity did not occur: Safety/medical concerns Walk 150 feet activity did not occur: Safety/medical concerns Walk 10 feet on uneven surfaces activity did not occur: Safety/medical concerns  Function - Comprehension Comprehension: Auditory Comprehension assistive device: Hearing aids Comprehension assist level: Follows basic conversation/direction with no assist  Function - Expression Expression: Verbal Expression assist level: Expresses basic needs/ideas: With no assist  Function - Social Interaction Social Interaction assist level: Interacts appropriately with others with medication or extra time (anti-anxiety, antidepressant).  Function - Problem Solving Problem solving assist level: Solves basic problems with no assist  Function - Memory Memory assist level: More than reasonable amount of time Patient normally able to recall (first 3 days only): Current season, Location of own room, Staff names and faces, That he or she is in a hospital  Medical Problem List and Plan: 1.  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  DVT Prophylaxis/Anticoagulation: Eliquis 3. Pain Management: Tylenol as needed, consider cont prn Tramadol 4. Mood: Risperdal 0.5 mg daily at bedtime, Zoloft 150 mg daily--stable at present 5. Neuropsych: This patient is capable of making decisions on his own behalf. 6. Skin/Wound Care: Routine skin checks 7. Fluids/Electrolytes/Nutrition: Routine I&O with follow-up chemistries 8. Dysphagia. Mechanical soft thin liquid diet. Follow-up speech therapy. Monitor for any signs of aspiration 9. Atrial fibrillation with history of pulmonary emboli. Tambocor 50 mg every 12 hours. Cardiac rate controlled. 10. OSA/CPAP. Check oxygen saturations every shift 11. Diastolic congestive heart failure. Monitor for any signs of fluid overload. Lasix 40 mg daily   12. Hypertension: Monitor BP, cont Lasix Vitals:   06/30/16 2115 07/01/16 0552  BP:  126/66  Pulse: 80 (!) 55  Resp: 18 18  Temp:  97.6 F (36.4 C)   13. Morbid obesity. BMI 48.56. Dietary follow-up,                14. BPH. Proscar 5 mg daily.   -emptying with low pvr's    15. Hyperlipidemia. Lipitor 16. History of bilateral TKA. Patient used a Programmer, multimedia prior to admission 17.  Constipation-uses dulcolax at home -senokot -s bid  -sorbitol prn   LOS (Days) 10 A FACE TO FACE EVALUATION WAS PERFORMED  Kaya Klausing E 07/01/2016, 7:46 AM

## 2016-07-02 ENCOUNTER — Inpatient Hospital Stay (HOSPITAL_COMMUNITY): Payer: Medicare Other | Admitting: Physical Therapy

## 2016-07-02 MED ORDER — LORATADINE 10 MG PO TABS
10.0000 mg | ORAL_TABLET | Freq: Every day | ORAL | Status: DC
  Administered 2016-07-02 – 2016-07-12 (×11): 10 mg via ORAL
  Filled 2016-07-02 (×11): qty 1

## 2016-07-02 NOTE — Plan of Care (Signed)
Problem: RH BOWEL ELIMINATION Goal: RH STG MANAGE BOWEL W/MEDICATION W/ASSISTANCE STG Manage Bowel with Medication with min Assistance.  Outcome: Not Progressing No BM > 3 days

## 2016-07-02 NOTE — Progress Notes (Signed)
Physical Therapy Session Note  Patient Details  Name: [name] MRN: [id number] Date of Birth: Dec 12, 1945  Today's Date: 07/02/2016 PT Individual Time: 0809-0904 PT Individual Time Calculation (min): 55 min   Short Term Goals: Week 2:  PT Short Term Goal 1 (Week 2): Pt will reorient to midline with min verbal cues in 50% of observed opportunities PT Short Term Goal 2 (Week 2): Pt will ambulate 100' with LRAD and mod assist PT Short Term Goal 3 (Week 2): Pt will negotiate 4 steps with 1 rail and mod assist  Skilled Therapeutic Interventions/Progress Updates:  Pt received in bed & agreeable to tx. No c/o pain noted. Pt transferred supine<>sitting EOB with supervision and use of bed rails and donned shoes with set up assist to retreive them. Pt completed pivot bed>w/c with min assist with difficulty pivoting 2/2 decreased ability to maneuver LLE when transferring to L. Pt propelled w/c room<>gym with BUE & supervision. Gait training x 20 ft + 30 ft with RW & mirror for visual feedback for upright posture. Educated pt on weight shifting and provided mod assist for weight shifting R as well as for upright posture during gait. Pt with c/o L ankle "rolling out" during gait but none observed by PT.  Pt performed standing cone taps with parallel bars for UE support and mirror for visual feedback. Pt required min assist for standing balance with task addressing BLE coordination & weight shifting. Stair training completed with pt negotiating steps laterally & using L rail only to simulate home environment. Pt requires heavy verbal cuing for foot placement and safety during task but is able to negotiate stairs with light mod assist. At end of session pt returned to bed and was left with all needs within reach & bed alarm set.  Therapy Documentation Precautions:  Precautions Precautions: Fall Precaution Comments: dizzy, poor postural control Restrictions Weight Bearing Restrictions: No   See  Function Navigator for Current Functional Status.   Therapy/Group: Individual Therapy  [name] 07/02/2016, 9:30 AM

## 2016-07-02 NOTE — Progress Notes (Signed)
Subjective/Complaints:  No issues overnite, has sinus drainage, chronic uses antihistamine at home  ROS: pt denies nausea, vomiting, diarrhea, cough, shortness of breath or chest pain   Objective: Vital Signs: Blood pressure 133/60, pulse 60, temperature 97.7 F (36.5 C), temperature source Oral, resp. rate 18, height 6' (1.829 m), weight (!) 155.4 kg (342 lb 9.6 oz), SpO2 98 %. No results found. No results found for this or any previous visit (from the past 72 hour(s)).   HEENT: vertical diplopia in all visual fields, AT, [place] Cardio: RRR Resp: CTA B/L GI: ND, BS+ Extremity:  No Edema Skin:   Intact Neuro: Alert/Oriented, Normal Sensory and Other 5/5 BUE and 4/5 BLE, voice is hoarse esp after swallowing solids Musc/Skel:  Normal, no pain with ROM of UE or LE Gen NAD, obese   Assessment/Plan: 1. 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 Physiatrist is providing close team supervision and 24 hour management of active medical problems listed below. Physiatrist and rehab team continue to assess barriers to discharge/monitor patient progress toward functional and medical goals. FIM: Function - Bathing Position: Shower Body parts bathed by patient: Right arm, Abdomen, Left arm, Chest, Front perineal area, Right upper leg, Left upper leg, Right lower leg, Left lower leg Body parts bathed by helper: Buttocks Assist Level: Touching or steadying assistance(Pt > 75%), Assistive device Assistive Device Comment: Long handled sponge  Function- Upper Body Dressing/Undressing What is the patient wearing?: Pull over shirt/dress Pull over shirt/dress - Perfomed by patient: Thread/unthread right sleeve, Thread/unthread left sleeve, Pull shirt over trunk, Put head through opening Pull over shirt/dress - Perfomed by helper: Pull shirt over trunk Assist Level: Supervision or  verbal cues, Set up Assistive Device Comment: long handled sponge Set up : To obtain clothing/put away Function - Lower Body Dressing/Undressing What is the patient wearing?: Underwear, Pants, Socks, Shoes Position: Wheelchair/chair at sink Underwear - Performed by patient: Thread/unthread right underwear leg, Thread/unthread left underwear leg Underwear - Performed by helper: Pull underwear up/down Pants- Performed by patient: Thread/unthread left pants leg, Thread/unthread right pants leg Pants- Performed by helper: Pull pants up/down Non-skid slipper socks- Performed by helper: Don/doff right sock, Don/doff left sock Socks - Performed by patient: Don/doff right sock, Don/doff left sock Shoes - Performed by patient: Don/doff left shoe, Don/doff right shoe, Fasten right, Fasten left Assist for footwear: Setup, Supervision/touching assist Assist for lower body dressing: Assistive device Assistive Device Comment: reacher  Function - Toileting Toileting activity did not occur: No continent bowel/bladder event Toileting steps completed by patient: Adjust clothing after toileting, Adjust clothing prior to toileting, Performs perineal hygiene Toileting steps completed by helper: Adjust clothing prior to toileting, Performs perineal hygiene, Adjust clothing after toileting Toileting Assistive Devices: Grab bar or rail Assist level: Touching or steadying assistance (Pt.75%)  Function - Air cabin crew transfer activity did not occur: Safety/medical concerns Toilet transfer assistive device: Elevated toilet seat/BSC over toilet, Grab bar Assist level to toilet: Moderate assist (Pt 50 - 74%/lift or lower) Assist level from toilet: Moderate assist (Pt 50 - 74%/lift or lower)  Function - Chair/bed transfer Chair/bed transfer method: Stand pivot, Squat pivot Chair/bed transfer assist level: Touching or steadying assistance (Pt > 75%) Chair/bed transfer assistive device: Armrests,  Walker Chair/bed transfer details: Verbal cues for precautions/safety  Function - Locomotion: Wheelchair Will patient use wheelchair at discharge?: Yes Type: Manual Max wheelchair distance: 150 Assist Level: No help,  No cues, assistive device, takes more than reasonable amount of time Assist Level: No help, No cues, assistive device, takes more than reasonable amount of time Assist Level: No help, No cues, assistive device, takes more than reasonable amount of time Turns around,maneuvers to table,bed, and toilet,negotiates 3% grade,maneuvers on rugs and over doorsills: No Function - Locomotion: Ambulation Assistive device: Walker-rolling Max distance: 25 Assist level: Touching or steadying assistance (Pt > 75%) Assist level: Touching or steadying assistance (Pt > 75%) Walk 50 feet with 2 turns activity did not occur: Safety/medical concerns Walk 150 feet activity did not occur: Safety/medical concerns Walk 10 feet on uneven surfaces activity did not occur: Safety/medical concerns  Function - Comprehension Comprehension: Auditory Comprehension assistive device: Hearing aids Comprehension assist level: Follows complex conversation/direction with no assist  Function - Expression Expression: Verbal Expression assist level: Expresses complex 90% of the time/cues < 10% of the time  Function - Social Interaction Social Interaction assist level: Interacts appropriately 90% of the time - Needs monitoring or encouragement for participation or interaction.  Function - Problem Solving Problem solving assist level: Solves complex problems: With extra time  Function - Memory Memory assist level: More than reasonable amount of time Patient normally able to recall (first 3 days only): Current season, Location of own room, Staff names and faces, That he or she is in a hospital  Medical Problem List and Plan: 1.  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  DVT Prophylaxis/Anticoagulation: Eliquis 3. Pain Management: Tylenol as needed, consider cont prn Tramadol 4. Mood: Risperdal 0.5 mg daily at bedtime, Zoloft 150 mg daily--stable at present 5. Neuropsych: This patient is capable of making decisions on his own behalf. 6. Skin/Wound Care: Routine skin checks 7. Fluids/Electrolytes/Nutrition: Routine I&O with follow-up chemistries 8. Dysphagia. Mechanical soft thin liquid diet. Follow-up speech therapy. Monitor for any signs of aspiration 9. Atrial fibrillation with history of pulmonary emboli. Tambocor 50 mg every 12 hours. Cardiac rate controlled. 10. OSA/CPAP. Check oxygen saturations every shift 11. Diastolic congestive heart failure. Monitor for any signs of fluid overload. Lasix 40 mg daily   12. Hypertension: Monitor BP, cont Lasix Vitals:   07/01/16 1411 07/02/16 0544  BP: 120/74 133/60  Pulse: 69 60  Resp: 18 18  Temp: 98.6 F (37 C) 97.7 F (36.5 C)   13. Morbid obesity. BMI 48.56. Dietary follow-up,                14. BPH. Proscar 5 mg daily.   -emptying with low pvr's    15. Hyperlipidemia. Lipitor 16. History of bilateral TKA. Patient used a Programmer, multimedia prior to admission 17.  Constipation-uses dulcolax at home -senokot -s bid  -sorbitol prn 18.  Hx nasal congestion takes loratadine at home, will order  LOS (Days) 11 A FACE TO FACE EVALUATION WAS PERFORMED  KIRSTEINS,ANDREW E 07/02/2016, 7:38 AM

## 2016-07-03 ENCOUNTER — Inpatient Hospital Stay (HOSPITAL_COMMUNITY): Payer: Medicare Other

## 2016-07-03 ENCOUNTER — Inpatient Hospital Stay (HOSPITAL_COMMUNITY): Payer: Self-pay | Admitting: Occupational Therapy

## 2016-07-03 ENCOUNTER — Encounter (HOSPITAL_COMMUNITY): Payer: Self-pay | Admitting: Psychology

## 2016-07-03 ENCOUNTER — Inpatient Hospital Stay (HOSPITAL_COMMUNITY): Payer: Medicare Other | Admitting: Speech Pathology

## 2016-07-03 NOTE — Progress Notes (Signed)
Orthopedic Tech Progress Note Patient Details:  [name] 09-21-1945 [id number]  Ortho Devices Type of Ortho Device: Ankle Air splint   [name] 07/03/2016, 3:21 PM

## 2016-07-03 NOTE — Consult Note (Signed)
Neuropsychological Consultation   Patient:   [name]   DOB:   [birth year]  MR Number:  [id number]  Location:  [facility] [address] [facility] [address] [id number] [place] [address] Dept: [place]: [phone]           Date of Service:   07/03/2016  Start Time:   8 AM End Time:   8:55 AM   Provider/Observer:  Ilean Skill, Psy.D.       Clinical Neuropsychologist       Billing Code/Service: [phone] 4 Units  Chief Complaint:    Neurological changes following CVA Difficulty with coping to changes in functioning.  Reason for Service:  (H&I from Medical Records Dr. Posey Pronto):   Doren Custard A Cockmanis a 71 y.o.right handed malewith history of atrial fibrillation And pulmonary emboli maintained on Eliquisas well as low-dose aspirin, hypertension, history of pericardial effusion s/pwindow, OSA on CPAP, diastolic congestive heart failure, bilateral TKA, morbid obesity with gastric banding procedure. Per chart review, and patient, patient lives with spouse. Independent prior to admissionusing a cane and walker at times due to history of knee replacement. One level home with 7 steps to entry.Wife with limited physical assist with history of CABG. Son in the area works.Presented 06/19/2016 with headache of sudden onset while watching television and lean to the left side as well as slurred speech. Cranial CT scan negative. Patient did not receive TPA. 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 No proximal occlusion of the intracranial arteries. No significant stenosis or dissection of the carotid arteries. 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 Interventional radiology consulted no need for emergent endovascular treatment at present time. Echocardiogram with ejection fraction 31% grade 1 diastolic dysfunction.Swallow study  completed 06/21/2016 placed on a mechanical soft thin liquid diet. Neurology services consult infarct felt to be embolic secondary to known atrial fibrillation and remains on Eliquis as well as aspirin. Physical and occupational therapy evaluations completed with recommendations of physical medicine rehabilitation consult. Patient was admitted for a comprehensive rehabilitation program  The patient is a 71 yo male who was referred for Neuropsychological consultation, primarily dealing with coping and adaptive issues following a CVA.    Current Status:  The patient is reporting very vivid dreams and has had very restless REM sleep for some time and this has continued.  The patient appears to have increasing motivation to work in [place].    Reliability of Information: Information from medical chart, consultation with medical/treatment staff and 1 hour spent with patient.  Behavioral Observation: MOSIAH BASTIN  presents as a 71 y.o.-year-old Right Caucasian Male who appeared his stated age. his dress was Appropriate and he was Well Groomed and his manners were Appropriate to the situation.  his participation was indicative of Appropriate and Attentive behaviors.  There were physical disabilities noted.  he displayed an appropriate level of cooperation and motivation.     Interactions:    Active Appropriate and Attentive  Attention:   within normal limits and attention span and concentration were age appropriate  Memory:   within normal limits; recent and remote memory intact  Visuo-spatial:  abnormal  The patient is having some issues with vision  Speech (Volume):  normal  Speech:   normal; normal  Thought Process:  Coherent and Relevant  Though Content:  WNL; not suicidal  Orientation:   person,  place, time/date and situation  Judgment:   Good  Planning:   Good  Affect:    Blunted  Mood:    NA  Insight:   Good  Intelligence:   high  Current Employment: 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  Medical History:   Past Medical History:  Diagnosis Date  . Anemia 07/29/2013  . Asthma    childhood asthma  . Benign prostatic hypertrophy    several prostate biopsies neg for cancer.   . Bilateral hip pain   . Cancer ([place])   . CHF (congestive heart failure) ([place])   . Complication of anesthesia    MORPHINE CAUSES HALLUCINATIONS  . CVA (cerebral vascular accident) ([place]) 06/2016  . Depression   . FH: colonic polyps 2010  . Gout   . History of kidney stones   . Hypertension   . Kidney cysts   . Morbid obesity ([name])   . Numbness    feet / legs  . PAF (paroxysmal atrial fibrillation) with RVR 07/29/2013  . Sleep apnea    USES C PAP       @problemlist @       Family Med/Psych History:  Family History  Problem Relation Age of Onset  . Depression Mother   . Hypertension Mother   . Heart disease Father     MI  . Depression Brother   . Stroke Paternal Aunt     CVA  . Diabetes Paternal Uncle   . Heart disease Paternal Uncle     MI  . Heart disease Paternal Grandmother     MI  . Diabetes Cousin     PATERNAL    Risk of Suicide/Violence: virtually non-existent No reports of SI or HI  Impression/DX:  The patient had a left vertebral artery CVA.  Residual changes in motor function and cognitive function appear to be improving.  Disposition/Plan:  Will be available for further consultation if needed.  Informed patient of my availability if he would benefit from further psychological interventions.         Electronically Signed   _______________________ Ilean Skill, Psy.D.

## 2016-07-03 NOTE — Progress Notes (Addendum)
Physical Therapy Note  Patient Details  Name: [name] MRN: [id number] Date of Birth: July 26, 1945 Today's Date: 07/03/2016  0902-1000, 2 min individual tx Pain:none reported  Wearing eye patch L.  Supine> sit with supervision.  Pt sat EOB x 5 min without LOB.  Sit>< stand x 2, pt spontaneously remembering to place bil hands on R knee for R bias, with min guard assist.  Stand pivot to R with min assist.  Cueing for L foot placmeent before standing, to prevent rolling over of ankle.    neuromuscular re-education via forced use, multimodal cues for w/c propulson using bil UEs ; standing biased to L for full wt bearing, with visual cue fo bright green Theraband around neck for trunk righting to achieve midline orientation.  In supported sitting, reaching L/R x 5 each out of BOS to elicity trunk shortening/lengtheing on L side with good results; reciprocal scooting without use of UEs to activiate pelvic muscles, x 5 forward/backwards. Seated LLE coordination activities, touching L heel targets in clockwise and counterclockwise directions x 7 reps each, and alternating heel taps. Gait with RW  , cues for wider stance, upright trunk and forward gaze.  Pt left resting in w/c withall needs within reach.  tx 2:  [id number], 45 min individual tx Pain: 4/10 L ankle ; ACE wrapped during tx  bed mobility without rail, supervison.  w/c propulsion using bil UEs to/from therapy.  neuromuscular re-education via forced use, mirror feedback, multimodal cues for trunk shortening/lengtheing and hip extension on Kinetron for marching in sitting and standing with bil UE support> no UE support, briefly in standing, with slow LOB forward and L requiring assistance.  L ankle eversion seated, x 10, plus ACE wrap to L ankle to address ankle instability per pt. Pt had great difficulty coordinating trunk wt shift over RLE.  Gait x 22' with RW, including R turn, with min assist.  Distance limited by LLE fatigue.  Pt left  resting in w/c with quick release belt applied and all needs within reach.  See function navigator for current status.  Catarina Huntley 07/03/2016, 7:56 AM

## 2016-07-03 NOTE — Progress Notes (Signed)
Patient to self-administer CPAP using his machine from home.  Patient is familiar with equipment and procedure.  

## 2016-07-03 NOTE — Progress Notes (Signed)
Patient has home CPAP unit at bedside. Patient stated he would self administer CPAP when he goes to bed. RT will monitor as needed.

## 2016-07-03 NOTE — Progress Notes (Signed)
Subjective/Complaints:  No issues overnite, feels more steady with sitting  ROS: pt denies nausea, vomiting, diarrhea, cough, shortness of breath or chest pain   Objective: Vital Signs: Blood pressure 135/72, pulse (!) 58, temperature 97.6 F (36.4 C), temperature source Axillary, resp. rate 17, height 6' (1.829 m), weight (!) 155.4 kg (342 lb 9.6 oz), SpO2 96 %. No results found. No results found for this or any previous visit (from the past 72 hour(s)).   HEENT: vertical diplopia in all visual fields, AT, [place] Cardio: RRR Resp: CTA B/L GI: ND, BS+ Extremity:  No Edema Skin:   Intact Neuro: Alert/Oriented, Normal Sensory and Other 5/5 BUE and 4/5 BLE, voice is hoarse esp after swallowing solids Musc/Skel:  Normal, no pain with ROM of UE or LE Gen NAD, obese   Assessment/Plan: 1. 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 Physiatrist is providing close team supervision and 24 hour management of active medical problems listed below. Physiatrist and rehab team continue to assess barriers to discharge/monitor patient progress toward functional and medical goals. FIM: Function - Bathing Position: Shower Body parts bathed by patient: Right arm, Abdomen, Left arm, Chest, Front perineal area, Right upper leg, Left upper leg, Right lower leg, Left lower leg Body parts bathed by helper: Buttocks Assist Level: Touching or steadying assistance(Pt > 75%), Assistive device Assistive Device Comment: Long handled sponge  Function- Upper Body Dressing/Undressing What is the patient wearing?: Pull over shirt/dress Pull over shirt/dress - Perfomed by patient: Thread/unthread right sleeve, Thread/unthread left sleeve, Pull shirt over trunk, Put head through opening Pull over shirt/dress - Perfomed by helper: Pull shirt over trunk Assist Level: Supervision or verbal cues, Set  up Assistive Device Comment: long handled sponge Set up : To obtain clothing/put away Function - Lower Body Dressing/Undressing What is the patient wearing?: Underwear, Pants, Socks, Shoes Position: Wheelchair/chair at sink Underwear - Performed by patient: Thread/unthread right underwear leg, Thread/unthread left underwear leg Underwear - Performed by helper: Pull underwear up/down Pants- Performed by patient: Thread/unthread left pants leg, Thread/unthread right pants leg Pants- Performed by helper: Pull pants up/down Non-skid slipper socks- Performed by helper: Don/doff right sock, Don/doff left sock Socks - Performed by patient: Don/doff right sock, Don/doff left sock Shoes - Performed by patient: Don/doff left shoe, Don/doff right shoe, Fasten right, Fasten left Assist for footwear: Setup, Supervision/touching assist Assist for lower body dressing: Assistive device Assistive Device Comment: reacher  Function - Toileting Toileting activity did not occur: No continent bowel/bladder event Toileting steps completed by patient: Adjust clothing after toileting, Adjust clothing prior to toileting, Performs perineal hygiene Toileting steps completed by helper: Adjust clothing prior to toileting, Performs perineal hygiene, Adjust clothing after toileting Toileting Assistive Devices: Grab bar or rail Assist level: Touching or steadying assistance (Pt.75%)  Function - Air cabin crew transfer activity did not occur: Safety/medical concerns Toilet transfer assistive device: Elevated toilet seat/BSC over toilet, Grab bar Assist level to toilet: Moderate assist (Pt 50 - 74%/lift or lower) Assist level from toilet: Moderate assist (Pt 50 - 74%/lift or lower)  Function - Chair/bed transfer Chair/bed transfer method: Squat pivot Chair/bed transfer assist level: Touching or steadying assistance (Pt > 75%) Chair/bed transfer assistive device: Armrests Chair/bed transfer details: Verbal  cues for precautions/safety  Function - Locomotion: Wheelchair Will patient use wheelchair at discharge?: Yes Type: Manual Max wheelchair distance: 150 ft Assist Level: Supervision or verbal cues Assist Level:  Supervision or verbal cues Assist Level: Supervision or verbal cues Turns around,maneuvers to table,bed, and toilet,negotiates 3% grade,maneuvers on rugs and over doorsills: No Function - Locomotion: Ambulation Assistive device: Walker-rolling Max distance: 30 ft Assist level: Moderate assist (Pt 50 - 74%) Assist level: Moderate assist (Pt 50 - 74%) Walk 50 feet with 2 turns activity did not occur: Safety/medical concerns Walk 150 feet activity did not occur: Safety/medical concerns Walk 10 feet on uneven surfaces activity did not occur: Safety/medical concerns  Function - Comprehension Comprehension: Auditory Comprehension assistive device: Hearing aids Comprehension assist level: Follows complex conversation/direction with no assist  Function - Expression Expression: Verbal Expression assist level: Expresses complex 90% of the time/cues < 10% of the time  Function - Social Interaction Social Interaction assist level: Interacts appropriately 90% of the time - Needs monitoring or encouragement for participation or interaction.  Function - Problem Solving Problem solving assist level: Solves complex problems: With extra time  Function - Memory Memory assist level: More than reasonable amount of time Patient normally able to recall (first 3 days only): Current season, Location of own room, Staff names and faces, That he or she is in a hospital  Medical Problem List and Plan: 1.  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  DVT Prophylaxis/Anticoagulation: Eliquis 3. Pain Management: Tylenol as needed,  consider cont prn Tramadol 4. Mood: Risperdal 0.5 mg daily at bedtime, Zoloft 150 mg daily--home meds 5. Neuropsych: This patient is capable of making decisions on his own behalf. 6. Skin/Wound Care: Routine skin checks 7. Fluids/Electrolytes/Nutrition: Routine I&O with follow-up chemistries 8. Dysphagia. Mechanical soft thin liquid diet. Follow-up speech therapy. Monitor for any signs of aspiration 9. Atrial fibrillation with history of pulmonary emboli. Tambocor 50 mg every 12 hours. Cardiac rate controlled. 10. OSA/CPAP. Check oxygen saturations every shift 11. Diastolic congestive heart failure. Monitor for any signs of fluid overload. Lasix 40 mg daily   12. Hypertension: Monitor BP, cont Lasix- good range Vitals:   07/02/16 1637 07/03/16 0405  BP: 115/62 135/72  Pulse: (!) 58 (!) 58  Resp: 18 17  Temp: 97.5 F (36.4 C) 97.6 F (36.4 C)   13. Morbid obesity. BMI 48.56. Dietary follow-up,                14. BPH. Proscar 5 mg daily.      15. Hyperlipidemia. Lipitor 16. History of bilateral TKA. Patient used a Programmer, multimedia prior to admission 17.  Constipation-uses dulcolax at home -senokot -s bid  -sorbitol prn 18.  Hx nasal congestion takes loratadine at home, will order  LOS (Days) 12 A FACE TO FACE EVALUATION WAS PERFORMED  Rachella Basden E 07/03/2016, 7:41 AM

## 2016-07-03 NOTE — Progress Notes (Signed)
Occupational Therapy Session Note  Patient Details  Name: [name] MRN: [id number] Date of Birth: [birth year]  Today's Date: 07/03/2016 OT Individual Time: 1100-1154 OT Individual Time Calculation (min): 54 min   Short Term Goals: Week 2:  OT Short Term Goal 1 (Week 2): Pt will consistently complete toilet transfer with min A OT Short Term Goal 2 (Week 2): Pt will tolerate standing at the sink for 2 grooming tasks with min A for balance OT Short Term Goal 3 (Week 2): Pt will complete LB dressing sit<>stand with min A OT Short Term Goal 4 (Week 2): Pt will complete tub shower transfer with min A  Skilled Therapeutic Interventions/Progress Updates:    OT treatment session focused on toileting strategies, modified bathing/dressing, transfers, and transfers. Stand-pivot wc>raised toilet using grab bars with min A to stand and Mod A to pivot. Pt able to manage clothing prior to sitting for successful BM. Pt attempted to use extension toilet aid, but required assistance for thoroughness. Pt transferred back to wc, then to tub bench in similar fashion. Bathing completed using long-handled sponge and VC for L foot placement when squatting to wash bottom using sponge. Dressing at the sink with Min A sit<>stand, VC for hand placement and anterior weight shift. Min A + VC to facilitate normal movement patterns, and min A for balance strategies while pulling up pants using alternating UE's. Pt donned socks by propping feet onto bed. Stand-pivot to R to return to bed with min A. Pt reports feeling L ankle pop as he has a history of weak tendons. Requested air cast for L ankle support during transfers.  Therapy Documentation Precautions:  Precautions Precautions: Fall Precaution Comments: dizzy, poor postural control Restrictions Weight Bearing Restrictions: No Pain: Pain Assessment Pain Assessment: 0-10 Pain Score: 3  Pain Type: Acute pain Pain Location: Ankle Pain Descriptors / Indicators:  Aching Pain Onset: With Activity Pain Intervention(s): Repositioned ADL: ADL ADL Comments: Please see functional navigator  See Function Navigator for Current Functional Status.   Therapy/Group: Individual Therapy  Valma Cava 07/03/2016, 12:21 PM

## 2016-07-03 NOTE — Progress Notes (Signed)
Speech Language Pathology Daily Session Note  Patient Details  Name: [name] MRN: [id number] Date of Birth: 1945/09/08  Today's Date: 07/03/2016 SLP Individual Time: 1415-1500 SLP Individual Time Calculation (min): 45 min  Short Term Goals: Week 2: SLP Short Term Goal 1 (Week 2): Patient will recall and utilize left head turn and tuck with thin liquid sips with minimal overt s/s of aspiration with Supervision level verbal cues.  SLP Short Term Goal 2 (Week 2): Patient will consume trials of Dys. 3 textures without overt s/s of aspiration or reports of pharyngeal residuals over 2 sessions with supervision verbal cues prior to upgrde.  SLP Short Term Goal 3 (Week 2): Patient will consume current diet with minimal overt s/s of aspiration with Mod I.   Skilled Therapeutic Interventions: Skilled treatment session focused on dysphagia goals. SLP facilitated session by providing skilled observation of pt consuming graham crackers with peanut butter and thin liquids via cup. Pt able to recall compensatory swallow strategies but required Min A to Mod A to use when in mildly distracting environment. As snack progressed (and conversation progressed) pt with increased throat clears d/t decreased use of head turn and tuck. Pt is not a candidate for upgrade to regular at today's session d/t increased s/s d/t decreased use of compensatory swallow strategies. Pt was returned to room, left upright in wheelchair and all needs within reach. Continue per current plan of care.      Function:  Eating Eating   Modified Consistency Diet: Yes Eating Assist Level: Supervision or verbal cues           Cognition Comprehension Comprehension assist level: Follows complex conversation/direction with no assist  Expression   Expression assist level: Expresses complex 90% of the time/cues < 10% of the time  Social Interaction Social Interaction assist level: Interacts appropriately 90% of the time - Needs  monitoring or encouragement for participation or interaction.  Problem Solving Problem solving assist level: Solves complex problems: With extra time  Memory Memory assist level: Recognizes or recalls 90% of the time/requires cueing < 10% of the time    Pain Pain Assessment Pain Assessment: 0-10 Pain Score: 3  Pain Type: Acute pain Pain Location: Ankle Pain Descriptors / Indicators: Aching Pain Onset: With Activity Pain Intervention(s): Repositioned  Therapy/Group: Individual Therapy  Antinio Sanderfer B. Rutherford Nail, M.S., CCC-SLP Speech-Language Pathologist  [name] 07/03/2016, 2:43 PM

## 2016-07-03 NOTE — Plan of Care (Signed)
Problem: RH BOWEL ELIMINATION Goal: RH STG MANAGE BOWEL WITH ASSISTANCE STG Manage Bowel with min Assistance.  Outcome: Not Progressing No BM>3 days Goal: RH STG MANAGE BOWEL W/MEDICATION W/ASSISTANCE STG Manage Bowel with Medication with min Assistance.  Outcome: Not Progressing No BM > 3 days   

## 2016-07-04 ENCOUNTER — Inpatient Hospital Stay (HOSPITAL_COMMUNITY): Payer: Medicare Other | Admitting: Speech Pathology

## 2016-07-04 ENCOUNTER — Inpatient Hospital Stay (HOSPITAL_COMMUNITY): Payer: Self-pay | Admitting: Occupational Therapy

## 2016-07-04 ENCOUNTER — Inpatient Hospital Stay (HOSPITAL_COMMUNITY): Payer: Medicare Other | Admitting: Physical Therapy

## 2016-07-04 DIAGNOSIS — Z9989 Dependence on other enabling machines and devices: Secondary | ICD-10-CM

## 2016-07-04 DIAGNOSIS — G4733 Obstructive sleep apnea (adult) (pediatric): Secondary | ICD-10-CM

## 2016-07-04 NOTE — Progress Notes (Signed)
Physical Therapy Session Note  Patient Details  Name: [name] MRN: [id number] Date of Birth: [birth year]  Today's Date: 07/04/2016 PT Individual Time: 4327-6147 PT Individual Time Calculation (min): 60 min   Short Term Goals: Week 2:  PT Short Term Goal 1 (Week 2): Pt will reorient to midline with min verbal cues in 50% of observed opportunities PT Short Term Goal 2 (Week 2): Pt will ambulate 100' with LRAD and mod assist PT Short Term Goal 3 (Week 2): Pt will negotiate 4 steps with 1 rail and mod assist  Skilled Therapeutic Interventions/Progress Updates: Pt presented asleep in w/c easily aroused and agreeable to therapy. Propelled to rehab gym with [place] supervision. Pt with air cast donned, pt ambulated 76f with RW and min assist. Required cues for LLE placement and increasing BOS. x2 standing breaks to re-orient self to midline as pt would occasionally increase lean to L.  Pt performed sit to supine with min guard and additional time and performed trunk rotation with therapy ball for increased lateral trunk activation 2x10. Supine to sit with min guard from flat surface. Sitting lateral trunk leans for increased awareness to midline orientation. Performed standing balance/coordination activities including alternating toe taps and toe taps to target with LLE. Pt able to reach target accurately however increased diffuclty in placing foot back to origin without correction. Pt performed ambulatory transfer back to w/c with RW with min guard and propelled back to room with supervision. Performed squat pivot transfer w/c to bed with supervision, and sit to supine with supervision.  Pt in bed at end of session with alarm on and needs met.      Therapy Documentation Precautions:  Precautions Precautions: Fall Precaution Comments: dizzy, poor postural control Restrictions Weight Bearing Restrictions: No General:   Vital Signs:  Pain: Pain Assessment Pain Score: 2   See Function  Navigator for Current Functional Status.   Therapy/Group: Individual Therapy  Kru Allman  Chinelo Benn, PTA  07/04/2016, 12:54 PM

## 2016-07-04 NOTE — Progress Notes (Signed)
Subjective/Complaints:  Appreciate Neuropsych note Pt worried about d/c date "coming up soon"  ROS: pt denies nausea, vomiting, diarrhea, cough, shortness of breath or chest pain   Objective: Vital Signs: Blood pressure (!) 123/53, pulse (!) 54, temperature 98 F (36.7 C), temperature source Oral, resp. rate 17, height 6' (1.829 m), weight (!) 155.4 kg (342 lb 9.6 oz), SpO2 98 %. No results found. No results found for this or any previous visit (from the past 72 hour(s)).   HEENT: vertical diplopia in all visual fields, AT, [place] Cardio: RRR Resp: CTA B/L GI: ND, BS+ Extremity:  No Edema Skin:   Intact Neuro: Alert/Oriented, Normal Sensory and Other 5/5 BUE and 4/5 BLE, voice is hoarse esp after swallowing solids Musc/Skel:  Normal, no pain with ROM of UE or LE Gen NAD, obese   Assessment/Plan: 1. 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 Physiatrist is providing close team supervision and 24 hour management of active medical problems listed below. Physiatrist and rehab team continue to assess barriers to discharge/monitor patient progress toward functional and medical goals. FIM: Function - Bathing Position: Shower Body parts bathed by patient: Right arm, Left arm, Chest, Abdomen, Front perineal area, Left upper leg, Right lower leg, Left lower leg, Right upper leg Body parts bathed by helper: Buttocks Assist Level: Touching or steadying assistance(Pt > 75%) Assistive Device Comment: Long handled sponge  Function- Upper Body Dressing/Undressing What is the patient wearing?: Pull over shirt/dress Pull over shirt/dress - Perfomed by patient: Thread/unthread right sleeve, Thread/unthread left sleeve, Put head through opening, Pull shirt over trunk Pull over shirt/dress - Perfomed by helper: Pull shirt over trunk Assist Level: Set up Assistive Device Comment:  long handled sponge Set up : To obtain clothing/put away Function - Lower Body Dressing/Undressing What is the patient wearing?: Underwear, Pants, Shoes, Non-skid slipper socks Position: Wheelchair/chair at sink Underwear - Performed by patient: Thread/unthread left underwear leg, Thread/unthread right underwear leg Underwear - Performed by helper: Pull underwear up/down Pants- Performed by patient: Thread/unthread right pants leg, Thread/unthread left pants leg Pants- Performed by helper: Pull pants up/down Non-skid slipper socks- Performed by patient: Don/doff left sock, Don/doff right sock Non-skid slipper socks- Performed by helper: Don/doff right sock, Don/doff left sock Socks - Performed by patient: Don/doff right sock, Don/doff left sock Shoes - Performed by patient: Don/doff left shoe, Don/doff right shoe, Fasten right, Fasten left Shoes - Performed by helper: Don/doff right shoe, Don/doff left shoe, Fasten right, Fasten left Assist for footwear: Supervision/touching assist Assist for lower body dressing: Touching or steadying assistance (Pt > 75%), Assistive device Assistive Device Comment: reacher  Function - Toileting Toileting activity did not occur: No continent bowel/bladder event Toileting steps completed by patient: Adjust clothing prior to toileting Toileting steps completed by helper: Performs perineal hygiene, Adjust clothing after toileting Toileting Assistive Devices: Grab bar or rail Assist level: Touching or steadying assistance (Pt.75%)  Function - Air cabin crew transfer activity did not occur: Safety/medical concerns Toilet transfer assistive device: Elevated toilet seat/BSC over toilet Assist level to toilet: Touching or steadying assistance (Pt > 75%) Assist level from toilet: Touching or steadying assistance (Pt > 75%)  Function - Chair/bed transfer Chair/bed transfer method: Squat pivot Chair/bed transfer assist level: Touching or steadying  assistance (Pt > 75%) Chair/bed transfer assistive device: Armrests Chair/bed transfer details: Verbal cues for precautions/safety  Function - Locomotion: Wheelchair Will patient use wheelchair at  discharge?: Yes Type: Manual Max wheelchair distance: 150 Assist Level: Supervision or verbal cues Assist Level: Supervision or verbal cues Assist Level: Supervision or verbal cues Turns around,maneuvers to table,bed, and toilet,negotiates 3% grade,maneuvers on rugs and over doorsills: No Function - Locomotion: Ambulation Assistive device: Walker-rolling Max distance: 30 Assist level: Touching or steadying assistance (Pt > 75%) Assist level: Touching or steadying assistance (Pt > 75%) Walk 50 feet with 2 turns activity did not occur: Safety/medical concerns Walk 150 feet activity did not occur: Safety/medical concerns Walk 10 feet on uneven surfaces activity did not occur: Safety/medical concerns  Function - Comprehension Comprehension: Auditory Comprehension assistive device: Hearing aids Comprehension assist level: Follows complex conversation/direction with extra time/assistive device  Function - Expression Expression: Verbal Expression assist level: Expresses complex 90% of the time/cues < 10% of the time  Function - Social Interaction Social Interaction assist level: Interacts appropriately 90% of the time - Needs monitoring or encouragement for participation or interaction.  Function - Problem Solving Problem solving assist level: Solves complex 90% of the time/cues < 10% of the time  Function - Memory Memory assist level: Recognizes or recalls 90% of the time/requires cueing < 10% of the time Patient normally able to recall (first 3 days only): Current season, Location of own room, Staff names and faces, That he or she is in a hospital  Medical Problem List and Plan: 1.  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  DVT Prophylaxis/Anticoagulation: Eliquis 3. Pain Management: Tylenol as needed, consider cont prn Tramadol 4. Mood: Risperdal 0.5 mg daily at bedtime, Zoloft 150 mg daily--home meds 5. Neuropsych: This patient is capable of making decisions on his own behalf. 6. Skin/Wound Care: Routine skin checks 7. Fluids/Electrolytes/Nutrition: Routine I&O with follow-up chemistries 8. Dysphagia. Mechanical soft thin liquid diet. Follow-up speech therapy. Monitor for any signs of aspiration 9. Atrial fibrillation with history of pulmonary emboli. Tambocor 50 mg every 12 hours. Cardiac rate controlled. 10. OSA/CPAP. Check oxygen saturations every shift, sleeping well 11. Diastolic congestive heart failure. Monitor for any signs of fluid overload. Lasix 40 mg daily   12. Hypertension: Monitor BP, cont Lasix- good range 4/3 Vitals:   07/03/16 2005 07/04/16 0500  BP:  (!) 123/53  Pulse: 62 (!) 54  Resp: 16 17  Temp:     13. Morbid obesity. BMI 48.56. Dietary follow-up,                14. BPH. Proscar 5 mg daily.      15. Hyperlipidemia. Lipitor 16. History of bilateral TKA. Patient used a Programmer, multimedia prior to admission 17.  Constipation-uses dulcolax at home -senokot -s bid  -sorbitol prn 18.  Hx nasal congestion takes loratadine at home, will order  LOS (Days) 13 A FACE TO FACE EVALUATION WAS PERFORMED  Seleste Tallman E 07/04/2016, 6:54 AM

## 2016-07-04 NOTE — Progress Notes (Signed)
Occupational Therapy Session Note  Patient Details  Name: [name] MRN: [id number] Date of Birth: 03/30/1946  Today's Date: 07/04/2016  Session 1 OT Individual Time: 0800-0900 OT Individual Time Calculation (min): 60 min   Session 2 OT Individual Time: 1240-1300 OT Individual Time Calculation (min): 20 min   Short Term Goals: Week 2:  OT Short Term Goal 1 (Week 2): Pt will consistently complete toilet transfer with min A OT Short Term Goal 2 (Week 2): Pt will tolerate standing at the sink for 2 grooming tasks with min A for balance OT Short Term Goal 3 (Week 2): Pt will complete LB dressing sit<>stand with min A OT Short Term Goal 4 (Week 2): Pt will complete tub shower transfer with min A  Skilled Therapeutic Interventions/Progress Updates:  Session 1   OT treatment session focused on discussion of OT goals and POC, standing balance, B UE coordination, toileting, and LB dressing strategies. Pt expressed concern for readiness to dc home next week. OT validated pt's concern but encouraged pt with examples of progress within ADL tasks and reassured pt of OT goals and purpose of therapy. OT donned L ankle air cast for stability  w/ standing and transfers. Squat-pivot to L w/ min guard A.. Sit<>stand with close supervision and VC for hand placement and technique to maintain anterior weight shift. Pt tolerated 2 standing grooming tasks using hip press on sink for support to free B UE's while brushing teeth, then shaving. Focus on L UE coordination with electric shaving activity. Stand-pivot >toilet with min guard A using grab bars. Successful BM, then addressed toileting strategies using toilet aid- with some success, but required assistance for thoroughness. Min A for dynamic balance to pull pants over hips. Pt left seated in wc at end of session with needs met.   Session 2 OT treatment session focused on L [facility] and sitting balance. L [facility] focused on in-hand manipulation, translation, and  rotation using graded peg board activity. Dynamic sitting balance with reaching outside base of support in all 4 quadrants to return pegs to bin. Improved postural control with 0 LOB and consistent return to midline prior to next reach. Pt left seated EOB at end of session with needs met.   Therapy Documentation Precautions:  Precautions Precautions: Fall Precaution Comments: dizzy, poor postural control Restrictions Weight Bearing Restrictions: No Pain:  denies pain ADL: ADL ADL Comments: Please see functional navigator  See Function Navigator for Current Functional Status.   Therapy/Group: Individual Therapy  Valma Cava 07/04/2016, 7:23 PM

## 2016-07-04 NOTE — Progress Notes (Signed)
Speech Language Pathology Daily Session Note  Patient Details  Name: [name] MRN: [id number] Date of Birth: 1945-12-28  Today's Date: 07/04/2016 SLP Individual Time: 1300-1340 SLP Individual Time Calculation (min): 40 min  Short Term Goals: Week 2: SLP Short Term Goal 1 (Week 2): Patient will recall and utilize left head turn and tuck with thin liquid sips with minimal overt s/s of aspiration with Supervision level verbal cues.  SLP Short Term Goal 2 (Week 2): Patient will consume trials of Dys. 3 textures without overt s/s of aspiration or reports of pharyngeal residuals over 2 sessions with supervision verbal cues prior to upgrde.  SLP Short Term Goal 3 (Week 2): Patient will consume current diet with minimal overt s/s of aspiration with Mod I.   Skilled Therapeutic Interventions: Skilled treatment session focused on dysphagia goals. SLP facilitated session by providing skilled observation with trials of regular textures. Patient demonstrated efficient mastication with minimal overt s/s of aspiration with intermittent verbal cues needed for use of head turn and tuck to the left in a mildly distracting environment. Recommend trial tray prior to upgrade. Patient left sitting EOB with all needs within reach. Continue with current plan of care.      Function:  Eating Eating   Modified Consistency Diet: Yes Eating Assist Level: Supervision or verbal cues           Cognition Comprehension Comprehension assist level: Understands complex 90% of the time/cues 10% of the time  Expression   Expression assist level: Expresses complex 90% of the time/cues < 10% of the time  Social Interaction Social Interaction assist level: Interacts appropriately 90% of the time - Needs monitoring or encouragement for participation or interaction.  Problem Solving Problem solving assist level: Solves basic problems with no assist  Memory Memory assist level: Recognizes or recalls 90% of the  time/requires cueing < 10% of the time    Pain No/Denies Pain   Therapy/Group: Individual Therapy  Shalaunda Weatherholtz 07/04/2016, 2:57 PM

## 2016-07-05 ENCOUNTER — Inpatient Hospital Stay (HOSPITAL_COMMUNITY): Payer: Medicare Other | Admitting: Occupational Therapy

## 2016-07-05 ENCOUNTER — Inpatient Hospital Stay (HOSPITAL_COMMUNITY): Payer: Self-pay | Admitting: Occupational Therapy

## 2016-07-05 ENCOUNTER — Inpatient Hospital Stay (HOSPITAL_COMMUNITY): Payer: Medicare Other | Admitting: Speech Pathology

## 2016-07-05 ENCOUNTER — Inpatient Hospital Stay (HOSPITAL_COMMUNITY): Payer: Self-pay | Admitting: Physical Therapy

## 2016-07-05 DIAGNOSIS — I10 Essential (primary) hypertension: Secondary | ICD-10-CM

## 2016-07-05 NOTE — Progress Notes (Signed)
Occupational Therapy Session Note  Patient Details  Name: [name] MRN: [id number] Date of Birth: January 31, 1946  Today's Date: 07/05/2016 OT Individual Time: 1300-1330 OT Individual Time Calculation (min): 30 min    Short Term Goals: Week 2:  OT Short Term Goal 1 (Week 2): Pt will consistently complete toilet transfer with min A OT Short Term Goal 2 (Week 2): Pt will tolerate standing at the sink for 2 grooming tasks with min A for balance OT Short Term Goal 3 (Week 2): Pt will complete LB dressing sit<>stand with min A OT Short Term Goal 4 (Week 2): Pt will complete tub shower transfer with min A  Skilled Therapeutic Interventions/Progress Updates:    Pt seen for OT session focusing on functional standing balance. Pt in supine upon arrival, denying pain and agreeable to tx session. He transferred to EOB with supervision using hospital bed functions and stand pivot with RW to w/c with VCs for hand placement and increased time to "get barrings" before transferring.  He self propelled w/c to therapy gym for UE strengthening and endurance. Following rest break, pt completed dynamic standing activity, required to stand at [facility] and remove clothes pins placed around waistband of pants in simulation of LB dressing task. Completed with min steadying assist. Pt then required to obtain items placed slightly behind him on L and place to container slightly behind him on L, moving items from L hand to R hand in process. He completed with steadying assist, though pt was required to keep at least one hand on RW for balance. Pt tolerated ~3 minutes of dynamic standing to complete task.  Pt returned to room at end of session and completed supervision squat pivot transfer back to bed. Pt left in supine with all needs in reach.   Therapy Documentation Precautions:  Precautions Precautions: Fall Precaution Comments: dizzy, poor postural control Restrictions Weight Bearing Restrictions: No Pain:    No/  denies pain ADL: ADL ADL Comments: Please see functional navigator  See Function Navigator for Current Functional Status.   Therapy/Group: Individual Therapy  Lewis, Nidia Grogan C 07/05/2016, 7:25 AM

## 2016-07-05 NOTE — Progress Notes (Signed)
Physical Therapy Session Note  Patient Details  Name: [name] MRN: [id number] Date of Birth: 1946-03-08  Today's Date: 07/05/2016 PT Individual Time: 3870-6582 PT Individual Time Calculation (min): 60 min   Short Term Goals: Week 2:  PT Short Term Goal 1 (Week 2): Pt will reorient to midline with min verbal cues in 50% of observed opportunities PT Short Term Goal 2 (Week 2): Pt will ambulate 100' with LRAD and mod assist PT Short Term Goal 3 (Week 2): Pt will negotiate 4 steps with 1 rail and mod assist  Skilled Therapeutic Interventions/Progress Updates:    no c/o pain, but does report ongoing dizziness with bed mobility.  Session focus on transfers, gait, stair negotiation, and bed mobility to simulate home set up.   Pt transitions supine>sit towards L with supervision and cues self to orient to midline when leaning L.  Blocked practice supine<>sit towards R side to decrease L lean when first coming to sit and reduce dizziness with positive results.  Squat/pivot throughout session to L and R with steady assist, pt continues to demonstrate decreased coordination when transferring to L side.  Gait training x50' + 20' with RW and overall min assist, verbal cues for walker positioning when turning to sit.  Stair negotiation 2x4 steps with L ascending rail (no break between trials), with overall steady assist.  Rest breaks provided throughout session as needed for fatigue and for dizziness.  Pt returned to room at end of session and repositioned in bed with call bell in reach and needs met.   Therapy Documentation Precautions:  Precautions Precautions: Fall Precaution Comments: dizzy, poor postural control Restrictions Weight Bearing Restrictions: No   See Function Navigator for Current Functional Status.   Therapy/Group: Individual Therapy  Inita Uram E Penven-Crew 07/05/2016, 11:29 AM

## 2016-07-05 NOTE — Progress Notes (Signed)
Social Work Patient ID: [name], male   DOB: 06-06-1945, 71 y.o.   MRN: [id number]   CSW met with pt and later spoke with his wife via telephone to update them on team conference discussion.  Pt feel more confident and comfortable with going home on 07-12-16 than he did earlier.  He is having some modifications done at home and that eases his mind.  Pt was not worried, but slightly concerned.  Explained we would have therapy f/u and order any recommended DME.  He was appreciative and asked good questions about both.  CSW will follow up with pt next week with answers to his questions.  CSW will continue to follow and assist as needed.

## 2016-07-05 NOTE — Patient Care Conference (Signed)
Inpatient RehabilitationTeam Conference and Plan of Care Update Date: 07/05/2016   Time: 10:55 AM    Patient Name: [name]      Medical Record Number: [id number]  Date of Birth: 08-26-1945 Sex: Male         Room/Bed: 4M03C/4M03C-01 Payor Info: Payor: MEDICARE / Plan: MEDICARE PART A AND B / Product Type: *No Product type* /    Admitting Diagnosis: L CVA  Admit Date/Time:  06/21/2016  4:39 PM Admission Comments: No comment available   Primary Diagnosis:  Wallenberg syndrome Principal Problem: Wallenberg syndrome  Patient Active Problem List   Diagnosis Date Noted  . 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 Embolic cerebral infarction ([place]) 06/21/2016  . Stroke syndrome ([place])   . OSA (obstructive sleep apnea)   . Hyperlipidemia   . Left-sided weakness   . History of pulmonary embolism   . History of total knee arthroplasty, bilateral   . Dysphagia, post-stroke   . Hypokalemia   . Ataxia, post-stroke   . Neurologic gait disorder   . Headache 06/19/2016  . Claudication ([place]) 04/28/2016  . Encounter for monitoring flecainide therapy 04/28/2016  . Shortness of breath 04/28/2016  . Pure hyperglyceridemia 04/26/2016  . Skin lesion of chest wall 04/26/2016  . Recurrent pulmonary emboli ([place]) 06/13/2015  . PE (pulmonary embolism) 06/13/2015  . GERD (gastroesophageal reflux disease) 06/13/2015  . Asthma 06/13/2015  . Basal cell carcinoma of skin 06/28/2014  . 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 Sinus bradycardia 12/15/2013  . Abnormal serum level of alkaline phosphatase 08/01/2013  . PAF (paroxysmal atrial fibrillation) ([place]) 07/29/2013  . Chronic anticoagulation 07/22/2013  . Gastric ulcer with hemorrhage 07/20/13 07/22/2013  . Pulmonary thromboembolism- 07/16/13 07/16/2013  . Pericardial effusion s/p pericardial window 07/18/13 400cc 07/16/2013  . Diastolic congestive heart failure ([place]) 07/16/2013  . H/O laparoscopic adjustable gastric banding & hiatal hernia  repair 04/08/13 04/23/2013  . Prediabetes 02/05/2013  . TMJ (temporomandibular joint syndrome) 09/11/2012  . Morbid obesity ([place]) 08/09/2012  . Peripheral neuropathy ([place]) 11/02/2009  . Hyperlipidemia with target LDL less than 130 09/10/2008  . Depression 09/10/2008  . Obstructive sleep apnea 09/10/2008  . GOUT 06/28/2006  . HTN (hypertension) 06/28/2006  . BPH (benign prostatic hyperplasia) 06/28/2006    Expected Discharge Date: Expected Discharge Date: 07/12/16  Team Members Present: Physician leading conference: Dr. Alysia Penna Social Worker Present: Alfonse Alpers, LCSW Nurse Present: Heather Roberts, RN PT Present: Dwyane Dee, PT OT Present: Willeen Cass, OT SLP Present: Weston Anna, SLP PPS Coordinator present : Daiva Nakayama, RN, CRRN     Current Status/Progress Goal Weekly Team Focus  Medical   Diet upgraded, continues to require physical assistance for ambulation with walker. Used a walker prior to admission without physical assistance.  Improved balance. Reduce fall risk  Determine whether patient will be going home, ambulating or mainly in a wheelchair   Bowel/Bladder   Continent of B&B; LBM 07/04/2016  maintain current continence status  assist pt with toileting as needed   Swallow/Nutrition/ Hydration   Dys. 3 textures with thin liquids, intermittent supervision   Mod I   Trial tray of regular textures    ADL's   s/u UB self care, min A LB bathing and dressing, min - mod A toileting,, min A toilet transfer  Mod I/supervision  NMR, transfer , ADL retraining, balance, visual motor skills, pt/family educ   Mobility   supervision>min guard for squat/pivot, min assist for stand/pivot, gait, and stairs  supervision overall  balance, NMR, coordination, transfers, gait, stair negotiation   Communication             Safety/Cognition/ Behavioral Observations            Pain   Pt denies pain; any pain managed with PRN tylenol and tramadol  Pain less than 3   assess for pain Q shift and prn; assess for nonverbal signs of pain   Skin   Edema to BLE  pt will be free of infection/ skin breakdown  assess skin for changes in integrity Q shift and PRN    Rehab Goals Patient on target to meet rehab goals: Yes Rehab Goals Revised: none *See Care Plan and progress notes for long and short-term goals.  Barriers to Discharge: Morbid obesity, previous orthopedic issues    Possible Resolutions to Barriers:  Continue rehabilitation, may need to make goals at wheelchair level    Discharge Planning/Teaching Needs:  Home with his wife who can only provide supervision level of care.   Wife has already observed pt in therapies.   Team Discussion:  Pt is tolerating CPAP.  He expressed to Dr. Letta Pate a couple of days ago that he was nervous that he was going home too soon.  Dr. Letta Pate told him how well he is progressing and the therapists have, as well.  PT reports that pt is feeling more confident.  Pt is expected to meet goals by 07-12-16.  ST will trial regular textures today.  Pt is doing well with PT and OT.  Revisions to Treatment Plan:  none   Continued Need for Acute Rehabilitation Level of Care: The patient requires daily medical management by a physician with specialized training in physical medicine and rehabilitation for the following conditions: Daily direction of a multidisciplinary physical rehabilitation program to ensure safe treatment while eliciting the highest outcome that is of practical value to the patient.: Yes Daily medical management of patient stability for increased activity during participation in an intensive rehabilitation regime.: Yes Daily analysis of laboratory values and/or radiology reports with any subsequent need for medication adjustment of medical intervention for : Neurological problems;Other  Whyatt Klinger, Silvestre Mesi 07/05/2016, 2:57 PM

## 2016-07-05 NOTE — Progress Notes (Signed)
Social Work Patient ID: [name], male   DOB: 12/11/1945, 71 y.o.   MRN: [id number]   Lynnda Child, LCSW Social Worker Signed   Patient Care Conference Date of Service: 07/05/2016  1:07 PM      Hide copied text Hover for attribution information Inpatient RehabilitationTeam Conference and Plan of Care Update Date: 07/05/2016   Time: 10:55 AM      Patient Name: [name]      Medical Record Number: [id number]  Date of Birth: 07-28-1945 Sex: Male         Room/Bed: 4M03C/4M03C-01 Payor Info: Payor: MEDICARE / Plan: MEDICARE PART A AND B / Product Type: *No Product type* /     Admitting Diagnosis: L CVA  Admit Date/Time:  06/21/2016  4:39 PM Admission Comments: No comment available    Primary Diagnosis:  Wallenberg syndrome Principal Problem: Wallenberg syndrome       Patient Active Problem List    Diagnosis Date Noted  . 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 Embolic cerebral infarction ([place]) 06/21/2016  . Stroke syndrome ([place])    . OSA (obstructive sleep apnea)    . Hyperlipidemia    . Left-sided weakness    . History of pulmonary embolism    . History of total knee arthroplasty, bilateral    . Dysphagia, post-stroke    . Hypokalemia    . Ataxia, post-stroke    . Neurologic gait disorder    . Headache 06/19/2016  . Claudication ([place]) 04/28/2016  . Encounter for monitoring flecainide therapy 04/28/2016  . Shortness of breath 04/28/2016  . Pure hyperglyceridemia 04/26/2016  . Skin lesion of chest wall 04/26/2016  . Recurrent pulmonary emboli ([place]) 06/13/2015  . PE (pulmonary embolism) 06/13/2015  . GERD (gastroesophageal reflux disease) 06/13/2015  . Asthma 06/13/2015  . Basal cell carcinoma of skin 06/28/2014  . 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 Sinus bradycardia 12/15/2013  . Abnormal serum level of alkaline phosphatase 08/01/2013  . PAF (paroxysmal atrial fibrillation) ([place]) 07/29/2013  . Chronic anticoagulation 07/22/2013  . Gastric  ulcer with hemorrhage 07/20/13 07/22/2013  . Pulmonary thromboembolism- 07/16/13 07/16/2013  . Pericardial effusion s/p pericardial window 07/18/13 400cc 07/16/2013  . Diastolic congestive heart failure ([place]) 07/16/2013  . H/O laparoscopic adjustable gastric banding & hiatal hernia repair 04/08/13 04/23/2013  . Prediabetes 02/05/2013  . TMJ (temporomandibular joint syndrome) 09/11/2012  . Morbid obesity ([place]) 08/09/2012  . Peripheral neuropathy ([place]) 11/02/2009  . Hyperlipidemia with target LDL less than 130 09/10/2008  . Depression 09/10/2008  . Obstructive sleep apnea 09/10/2008  . GOUT 06/28/2006  . HTN (hypertension) 06/28/2006  . BPH (benign prostatic hyperplasia) 06/28/2006      Expected Discharge Date: Expected Discharge Date: 07/12/16   Team Members Present: Physician leading conference: Dr. Alysia Penna Social Worker Present: Alfonse Alpers, LCSW Nurse Present: Heather Roberts, RN PT Present: Dwyane Dee, PT OT Present: Willeen Cass, OT SLP Present: Weston Anna, SLP PPS Coordinator present : Daiva Nakayama, RN, CRRN       Current Status/Progress Goal Weekly Team Focus  Medical     Diet upgraded, continues to require physical assistance for ambulation with walker. Used a walker prior to admission without physical assistance.  Improved balance. Reduce fall risk  Determine whether patient will be going home, ambulating or mainly in a wheelchair   Bowel/Bladder     Continent of B&B; LBM 07/04/2016  maintain current continence status  assist pt with toileting as needed   Swallow/Nutrition/ Hydration  Dys. 3 textures with thin liquids, intermittent supervision   Mod I   Trial tray of regular textures    ADL's     s/u UB self care, min A LB bathing and dressing, min - mod A toileting,, min A toilet transfer  Mod I/supervision  NMR, transfer , ADL retraining, balance, visual motor skills, pt/family educ   Mobility     supervision>min guard for squat/pivot, min assist  for stand/pivot, gait, and stairs  supervision overall  balance, NMR, coordination, transfers, gait, stair negotiation   Communication               Safety/Cognition/ Behavioral Observations             Pain     Pt denies pain; any pain managed with PRN tylenol and tramadol  Pain less than 3  assess for pain Q shift and prn; assess for nonverbal signs of pain   Skin     Edema to BLE  pt will be free of infection/ skin breakdown  assess skin for changes in integrity Q shift and PRN     Rehab Goals Patient on target to meet rehab goals: Yes Rehab Goals Revised: none *See Care Plan and progress notes for long and short-term goals.   Barriers to Discharge: Morbid obesity, previous orthopedic issues     Possible Resolutions to Barriers:  Continue rehabilitation, may need to make goals at wheelchair level     Discharge Planning/Teaching Needs:  Home with his wife who can only provide supervision level of care.   Wife has already observed pt in therapies.   Team Discussion:  Pt is tolerating CPAP.  He expressed to Dr. Letta Pate a couple of days ago that he was nervous that he was going home too soon.  Dr. Letta Pate told him how well he is progressing and the therapists have, as well.  PT reports that pt is feeling more confident.  Pt is expected to meet goals by 07-12-16.  ST will trial regular textures today.  Pt is doing well with PT and OT.  Revisions to Treatment Plan:  none    Continued Need for Acute Rehabilitation Level of Care: The patient requires daily medical management by a physician with specialized training in physical medicine and rehabilitation for the following conditions: Daily direction of a multidisciplinary physical rehabilitation program to ensure safe treatment while eliciting the highest outcome that is of practical value to the patient.: Yes Daily medical management of patient stability for increased activity during participation in an intensive rehabilitation  regime.: Yes Daily analysis of laboratory values and/or radiology reports with any subsequent need for medication adjustment of medical intervention for : Neurological problems;Other   Anshika Pethtel, Silvestre Mesi 07/05/2016, 2:57 PM

## 2016-07-05 NOTE — Progress Notes (Signed)
Speech Language Pathology Daily Session Note  Patient Details  Name: [name] MRN: [id number] Date of Birth: 14-Feb-1946  Today's Date: 07/05/2016 SLP Individual Time: 1130-1230 SLP Individual Time Calculation (min): 60 min  Short Term Goals: Week 2: SLP Short Term Goal 1 (Week 2): Patient will recall and utilize left head turn and tuck with thin liquid sips with minimal overt s/s of aspiration with Supervision level verbal cues.  SLP Short Term Goal 2 (Week 2): Patient will consume trials of Dys. 3 textures without overt s/s of aspiration or reports of pharyngeal residuals over 2 sessions with supervision verbal cues prior to upgrde.  SLP Short Term Goal 3 (Week 2): Patient will consume current diet with minimal overt s/s of aspiration with Mod I.   Skilled Therapeutic Interventions: Skilled treatment session focused on dysphagia goals. SLP facilitated session by providing skilled observation with lunch tray of regular textures with thin liquids. Patient consumed meal with intermittent verbal cues needed for use of head turn and tuck to the left in a mildly distracting environment. Patient demonstrated intermittent throat clearing but difficult to determine baseline throat clearing from allergies verses possible penetration. Of note, patient did not demonstrate an increase in throat clearing with upgraded textures. Therefore, recommend patient upgrade to regular textures with intermittent supervision. Patient left sitting EOB with all needs within reach. Continue with current plan of care.      Function:  Eating Eating   Modified Consistency Diet: No Eating Assist Level: Supervision or verbal cues           Cognition Comprehension Comprehension assist level: Understands complex 90% of the time/cues 10% of the time  Expression   Expression assist level: Expresses complex 90% of the time/cues < 10% of the time  Social Interaction Social Interaction assist level: Interacts  appropriately 90% of the time - Needs monitoring or encouragement for participation or interaction.  Problem Solving Problem solving assist level: Solves basic 90% of the time/requires cueing < 10% of the time  Memory Memory assist level: Recognizes or recalls 90% of the time/requires cueing < 10% of the time    Pain Pain Assessment Pain Assessment: No/denies pain  Therapy/Group: Individual Therapy  Amanada Philbrick 07/05/2016, 2:08 PM

## 2016-07-05 NOTE — Progress Notes (Signed)
Occupational Therapy Session Note  Patient Details  Name: [name] MRN: [id number] Date of Birth: [birth year]  Today's Date: 07/05/2016 OT Individual Time: 0830-0900 OT Individual Time Calculation (min): 30 min    Short Term Goals: Week 1:  OT Short Term Goal 1 (Week 1): Pt will maintain sitting balance while performing ADL task with supervision OT Short Term Goal 1 - Progress (Week 1): Met OT Short Term Goal 2 (Week 1): Pt will complete toilet transfer with min A OT Short Term Goal 2 - Progress (Week 1): Progressing toward goal OT Short Term Goal 3 (Week 1): Pt will tolerate standing at the sink for 2 grooming tasks with min A for balance OT Short Term Goal 3 - Progress (Week 1): Progressing toward goal Week 2:  OT Short Term Goal 1 (Week 2): Pt will consistently complete toilet transfer with min A OT Short Term Goal 2 (Week 2): Pt will tolerate standing at the sink for 2 grooming tasks with min A for balance OT Short Term Goal 3 (Week 2): Pt will complete LB dressing sit<>stand with min A OT Short Term Goal 4 (Week 2): Pt will complete tub shower transfer with min A     Skilled Therapeutic Interventions/Progress Updates:    Pt seen for ADL training of dressing with a focus on sit and stand balance. Pt worked on supine >< sit from flat bed without rails with S.  Sat for 1-2 min to rest as he had some mild dizziness. Doffed and donned shirt without A. Sit to stand min and then min to mod to support balance as he pulled pants down hips.  Used reacher to [employer] over feet. His briefs were slightly soiled as he has difficulty cleansing after toileting. Reviewed techniques of how to use toilet aid. Wet washcloth placed on toilet aid and pt cleansed. Therapist went over his bottom again to ensure cleanliness. Pt needs to work on balance with knee bend strategies with trunk rotation to enable him to cleanse self more easily. He used reacher to [employer] over feet and then min A with  balance to pull pants over hips.  Pt opted to lay back in bed to rest prior to next therapy session. Pt resting with all needs met.  Therapy Documentation Precautions:  Precautions Precautions: Fall Precaution Comments: dizzy, poor postural control Restrictions Weight Bearing Restrictions: No    Vital Signs: Oxygen Therapy SpO2: 96 % O2 Device: Not Delivered Pain: Pain Assessment Pain Assessment: No/denies pain Pain Score: 2  ADL: ADL ADL Comments: Please see functional navigator  See Function Navigator for Current Functional Status.   Therapy/Group: Individual Therapy  [place] 07/05/2016, 11:12 AM

## 2016-07-05 NOTE — Progress Notes (Signed)
Subjective/Complaints:  Pt without new issues overnite, sleeps with CPAP  ROS: pt denies nausea, vomiting, diarrhea, cough, shortness of breath or chest pain   Objective: Vital Signs: Blood pressure 140/61, pulse (!) 51, temperature 97.2 F (36.2 C), temperature source Oral, resp. rate 18, height 6' (1.829 m), weight (!) 155.2 kg (342 lb 1.6 oz), SpO2 98 %. No results found. No results found for this or any previous visit (from the past 72 hour(s)).   HEENT: vertical diplopia in all visual fields, AT, [place] Cardio: RRR Resp: CTA B/L GI: ND, BS+ Extremity:  No Edema Skin:   Intact Neuro: Alert/Oriented, Normal Sensory and Other 5/5 BUE and 4/5 BLE, voice is hoarse esp after swallowing solids Musc/Skel:  Normal, no pain with ROM of UE or LE Gen NAD, obese   Assessment/Plan: 1. 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 Physiatrist is providing close team supervision and 24 hour management of active medical problems listed below. Physiatrist and rehab team continue to assess barriers to discharge/monitor patient progress toward functional and medical goals. FIM: Function - Bathing Position: Shower Body parts bathed by patient: Right arm, Left arm, Chest, Abdomen, Front perineal area, Left upper leg, Right lower leg, Left lower leg, Right upper leg Body parts bathed by helper: Buttocks Assist Level: Touching or steadying assistance(Pt > 75%) Assistive Device Comment: Long handled sponge  Function- Upper Body Dressing/Undressing What is the patient wearing?: Pull over shirt/dress Pull over shirt/dress - Perfomed by patient: Thread/unthread right sleeve, Thread/unthread left sleeve, Put head through opening, Pull shirt over trunk Pull over shirt/dress - Perfomed by helper: Pull shirt over trunk Assist Level: Set up Assistive Device Comment: long handled sponge Set  up : To obtain clothing/put away Function - Lower Body Dressing/Undressing What is the patient wearing?: Pants, Underwear Position: Wheelchair/chair at sink Underwear - Performed by patient: Thread/unthread left underwear leg, Thread/unthread right underwear leg Underwear - Performed by helper: Pull underwear up/down Pants- Performed by patient: Thread/unthread right pants leg, Thread/unthread left pants leg Pants- Performed by helper: Pull pants up/down Non-skid slipper socks- Performed by patient: Don/doff left sock, Don/doff right sock Non-skid slipper socks- Performed by helper: Don/doff right sock, Don/doff left sock Socks - Performed by patient: Don/doff right sock, Don/doff left sock Shoes - Performed by patient: Don/doff left shoe, Don/doff right shoe, Fasten right, Fasten left Shoes - Performed by helper: Don/doff right shoe, Don/doff left shoe, Fasten right, Fasten left Assist for footwear: Supervision/touching assist Assist for lower body dressing: Touching or steadying assistance (Pt > 75%), Assistive device Assistive Device Comment: reacher  Function - Toileting Toileting activity did not occur: No continent bowel/bladder event Toileting steps completed by patient: Adjust clothing prior to toileting Toileting steps completed by helper: Adjust clothing after toileting, Performs perineal hygiene Toileting Assistive Devices: Grab bar or rail, Toilet aid Assist level: Touching or steadying assistance (Pt.75%)  Function - Air cabin crew transfer activity did not occur: Safety/medical concerns Toilet transfer assistive device: Elevated toilet seat/BSC over toilet Assist level to toilet: Touching or steadying assistance (Pt > 75%) Assist level from toilet: Touching or steadying assistance (Pt > 75%)  Function - Chair/bed transfer Chair/bed transfer method: Squat pivot Chair/bed transfer assist level: Touching or steadying assistance (Pt > 75%) Chair/bed transfer  assistive device: Armrests Chair/bed transfer details: Verbal cues for precautions/safety  Function - Locomotion: Wheelchair Will patient use wheelchair at discharge?: Yes Type: Manual Max wheelchair  distance: 150 Assist Level: Supervision or verbal cues Assist Level: Supervision or verbal cues Assist Level: Supervision or verbal cues Turns around,maneuvers to table,bed, and toilet,negotiates 3% grade,maneuvers on rugs and over doorsills: No Function - Locomotion: Ambulation Assistive device: Walker-rolling Max distance: 67 ft Assist level: Touching or steadying assistance (Pt > 75%) Assist level: Touching or steadying assistance (Pt > 75%) Walk 50 feet with 2 turns activity did not occur: Safety/medical concerns Assist level: Touching or steadying assistance (Pt > 75%) Walk 150 feet activity did not occur: Safety/medical concerns Walk 10 feet on uneven surfaces activity did not occur: Safety/medical concerns  Function - Comprehension Comprehension: Auditory Comprehension assistive device: Hearing aids Comprehension assist level: Understands complex 90% of the time/cues 10% of the time  Function - Expression Expression: Verbal Expression assist level: Expresses complex 90% of the time/cues < 10% of the time  Function - Social Interaction Social Interaction assist level: Interacts appropriately 90% of the time - Needs monitoring or encouragement for participation or interaction.  Function - Problem Solving Problem solving assist level: Solves basic problems with no assist  Function - Memory Memory assist level: Recognizes or recalls 90% of the time/requires cueing < 10% of the time Patient normally able to recall (first 3 days only): Current season, Location of own room, Staff names and faces, That he or she is in a hospital  Medical Problem List and Plan: 1.  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 Formulized individual treatment plan based on medical history, underlying problem and comorbidities. 2.  DVT Prophylaxis/Anticoagulation: Eliquis 3. Pain Management: Tylenol as needed, consider cont prn Tramadol 4. Mood: Risperdal 0.5 mg daily at bedtime, Zoloft 150 mg daily--home meds 5. Neuropsych: This patient is capable of making decisions on his own behalf. 6. Skin/Wound Care: Routine skin checks 7. Fluids/Electrolytes/Nutrition: Routine I&O with follow-up chemistries 8. Dysphagia. Mechanical soft thin liquid diet. Follow-up speech therapy. Monitor for any signs of aspiration 9. Atrial fibrillation with history of pulmonary emboli. Tambocor 50 mg every 12 hours. Cardiac rate controlled. 10. OSA/CPAP. Check oxygen saturations every shift, sleeping well 11. Diastolic congestive heart failure. Monitor for any signs of fluid overload. Lasix 40 mg daily   12. Hypertension: Monitor BP, cont Lasix- good range 4/4 Vitals:   07/04/16 1417 07/05/16 0500  BP: (!) 114/53 140/61  Pulse: 65 (!) 51  Resp: 18 18  Temp: 98.5 F (36.9 C) 97.2 F (36.2 C)   13. Morbid obesity. BMI 48.56. Dietary follow-up,                14. BPH. Proscar 5 mg daily.      15. Hyperlipidemia. Lipitor 16. History of bilateral TKA. Patient used a Programmer, multimedia prior to admission 17.  Constipation-uses dulcolax at home -senokot -s bid  -sorbitol prn 18.  Hx nasal congestion takes loratadine at home,  LOS (Days) [place] E 07/05/2016, 7:42 AM

## 2016-07-06 ENCOUNTER — Inpatient Hospital Stay (HOSPITAL_COMMUNITY): Payer: Medicare Other | Admitting: Speech Pathology

## 2016-07-06 ENCOUNTER — Inpatient Hospital Stay (HOSPITAL_COMMUNITY): Payer: Self-pay | Admitting: Occupational Therapy

## 2016-07-06 ENCOUNTER — Inpatient Hospital Stay (HOSPITAL_COMMUNITY): Payer: Medicare Other | Admitting: Physical Therapy

## 2016-07-06 NOTE — Progress Notes (Signed)
Subjective/Complaints:  No issues overnite , discussed care team conf Pt still with dizziness during position changes  ROS: pt denies nausea, vomiting, diarrhea, cough, shortness of breath or chest pain   Objective: Vital Signs: Blood pressure 139/71, pulse (!) 51, temperature 97.8 F (36.6 C), temperature source Oral, resp. rate 17, height 6' (1.829 m), weight (!) 155.3 kg (342 lb 6 oz), SpO2 99 %. No results found. No results found for this or any previous visit (from the past 72 hour(s)).   HEENT: vertical diplopia in all visual fields, AT, [place] Cardio: RRR Resp: CTA B/L GI: ND, BS+ Extremity:  No Edema Skin:   Intact Neuro: Alert/Oriented, Normal Sensory and Other 5/5 BUE and 4/5 BLE, voice is hoarse esp after swallowing solids Musc/Skel:  Normal, no Left ankle ecchymosis mild swelling  Gen NAD, obese   Assessment/Plan: 1. 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 Physiatrist is providing close team supervision and 24 hour management of active medical problems listed below. Physiatrist and rehab team continue to assess barriers to discharge/monitor patient progress toward functional and medical goals. FIM: Function - Bathing Position: Shower Body parts bathed by patient: Right arm, Left arm, Chest, Abdomen, Front perineal area, Left upper leg, Right lower leg, Left lower leg, Right upper leg Body parts bathed by helper: Buttocks Assist Level: Touching or steadying assistance(Pt > 75%) Assistive Device Comment: Long handled sponge  Function- Upper Body Dressing/Undressing What is the patient wearing?: Pull over shirt/dress Pull over shirt/dress - Perfomed by patient: Thread/unthread right sleeve, Thread/unthread left sleeve, Put head through opening, Pull shirt over trunk Pull over shirt/dress - Perfomed by helper: Pull shirt over trunk Assist Level: Set  up Assistive Device Comment: long handled sponge Set up : To obtain clothing/put away Function - Lower Body Dressing/Undressing What is the patient wearing?: Pants, Underwear Position: Sitting EOB Underwear - Performed by patient: Thread/unthread left underwear leg, Thread/unthread right underwear leg, Pull underwear up/down Underwear - Performed by helper: Pull underwear up/down Pants- Performed by patient: Thread/unthread right pants leg, Thread/unthread left pants leg, Pull pants up/down Pants- Performed by helper: Pull pants up/down Non-skid slipper socks- Performed by patient: Don/doff left sock, Don/doff right sock Non-skid slipper socks- Performed by helper: Don/doff right sock, Don/doff left sock Socks - Performed by patient: Don/doff right sock, Don/doff left sock Shoes - Performed by patient: Don/doff left shoe, Don/doff right shoe, Fasten right, Fasten left Shoes - Performed by helper: Don/doff right shoe, Don/doff left shoe, Fasten right, Fasten left Assist for footwear: Supervision/touching assist Assist for lower body dressing: Touching or steadying assistance (Pt > 75%), Assistive device Assistive Device Comment: reacher  Function - Toileting Toileting activity did not occur: No continent bowel/bladder event Toileting steps completed by patient: Adjust clothing prior to toileting Toileting steps completed by helper: Adjust clothing after toileting, Performs perineal hygiene Toileting Assistive Devices: Grab bar or rail, Toilet aid Assist level: Touching or steadying assistance (Pt.75%)  Function - Air cabin crew transfer activity did not occur: Safety/medical concerns Toilet transfer assistive device: Elevated toilet seat/BSC over toilet Assist level to toilet: Touching or steadying assistance (Pt > 75%) Assist level from toilet: Touching or steadying assistance (Pt > 75%)  Function - Chair/bed transfer Chair/bed transfer method: Squat pivot Chair/bed  transfer assist level: Touching or steadying assistance (Pt > 75%) Chair/bed transfer assistive device: Armrests Chair/bed transfer details: Verbal cues for precautions/safety  Function - Locomotion: Wheelchair  Will patient use wheelchair at discharge?: Yes Type: Manual Max wheelchair distance: 150 Assist Level: No help, No cues, assistive device, takes more than reasonable amount of time Assist Level: No help, No cues, assistive device, takes more than reasonable amount of time Assist Level: No help, No cues, assistive device, takes more than reasonable amount of time Turns around,maneuvers to table,bed, and toilet,negotiates 3% grade,maneuvers on rugs and over doorsills: Yes Function - Locomotion: Ambulation Assistive device: Walker-rolling Max distance: 50 Assist level: Touching or steadying assistance (Pt > 75%) Assist level: Touching or steadying assistance (Pt > 75%) Walk 50 feet with 2 turns activity did not occur: Safety/medical concerns Assist level: Touching or steadying assistance (Pt > 75%) Walk 150 feet activity did not occur: Safety/medical concerns Walk 10 feet on uneven surfaces activity did not occur: Safety/medical concerns  Function - Comprehension Comprehension: Auditory Comprehension assistive device: Hearing aids Comprehension assist level: Understands complex 90% of the time/cues 10% of the time  Function - Expression Expression: Verbal Expression assist level: Expresses complex 90% of the time/cues < 10% of the time  Function - Social Interaction Social Interaction assist level: Interacts appropriately 90% of the time - Needs monitoring or encouragement for participation or interaction.  Function - Problem Solving Problem solving assist level: Solves basic 90% of the time/requires cueing < 10% of the time  Function - Memory Memory assist level: Recognizes or recalls 90% of the time/requires cueing < 10% of the time Patient normally able to recall (first  3 days only): Current season, Location of own room, Staff names and faces, That he or she is in a hospital  Medical Problem List and Plan: 1.  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  DVT Prophylaxis/Anticoagulation: Eliquis 3. Pain Management: Tylenol as needed, consider cont prn Tramadol 4. Mood: Risperdal 0.5 mg daily at bedtime, Zoloft 150 mg daily--home meds 5. Neuropsych: This patient is capable of making decisions on his own behalf. 6. Skin/Wound Care: Routine skin checks 7. Fluids/Electrolytes/Nutrition: Routine I&O with follow-up chemistries 8. Dysphagia. Mechanical soft thin liquid diet. Follow-up speech therapy. Monitor for any signs of aspiration 9. Atrial fibrillation with history of pulmonary emboli. Tambocor 50 mg every 12 hours. Cardiac rate controlled. 10. OSA/CPAP. Check oxygen saturations every shift, sleeping well 11. Diastolic congestive heart failure. Monitor for any signs of fluid overload. Lasix 40 mg daily   12. Hypertension: Monitor BP, cont Lasix- good range 4/5 Vitals:   07/05/16 1445 07/06/16 0513  BP: 108/62 139/71  Pulse: 65 (!) 51  Resp: 18 17  Temp: 98.1 F (36.7 C) 97.8 F (36.6 C)   13. Morbid obesity. BMI 48.56. Dietary follow-up,                14. BPH. Proscar 5 mg daily.no retention       15. Hyperlipidemia. Lipitor 16. History of bilateral TKA. Patient used a Programmer, multimedia prior to admission 17.  Constipation-uses dulcolax at home -senokot -s bid  -sorbitol prn 18.  Hx nasal congestion takes loratadine  19.  Hx of recurrent ankle sprain Left , aircast when up LOS (Days) 15 A FACE TO FACE EVALUATION WAS PERFORMED  KIRSTEINS,ANDREW E 07/06/2016, 7:12 AM

## 2016-07-06 NOTE — Progress Notes (Signed)
Speech Language Pathology Weekly Progress and Session Note  Patient Details  Name: [name] MRN: [id number] Date of Birth: 11/29/1945  Beginning of progress report period: June 29, 2016 End of progress report period: July 06, 2016  Today's Date: 07/06/2016 SLP Individual Time: 0539-7673 SLP Individual Time Calculation (min): 45 min  Short Term Goals: Week 2: SLP Short Term Goal 1 (Week 2): Patient will recall and utilize left head turn and tuck with thin liquid sips with minimal overt s/s of aspiration with Supervision level verbal cues.  SLP Short Term Goal 1 - Progress (Week 2): Met SLP Short Term Goal 2 (Week 2): Patient will consume trials of Dys. 3 textures without overt s/s of aspiration or reports of pharyngeal residuals over 2 sessions with supervision verbal cues prior to upgrde.  SLP Short Term Goal 2 - Progress (Week 2): Met SLP Short Term Goal 3 (Week 2): Patient will consume current diet with minimal overt s/s of aspiration with Mod I.  SLP Short Term Goal 3 - Progress (Week 2): Not met    New Short Term Goals: Week 3: SLP Short Term Goal 1 (Week 3): Patient will recall and utilize left head turn and tuck with current diet with minimal overt s/s of aspiration with Mod I.   Weekly Progress Updates: Patient was made excellent gains and has met 2 of 3 STG's this reporting period. Currently, patient is consuming regular textures with thin liquids with minimal overt s/s of aspiration and requires overall intermittent supervision verbal cues for use of swallowing compensatory strategy in a mildly distracting environment. Patient education is ongoing. Due to patient's progress, recommend changing his SLP plan of care to 1-3X/week. Patient verbalized understanding and agreement. Patient would benefit from continued skilled SLP intervention to maximize use of swallowing compensatory strategies and to assess tolerance of upgrade.      Intensity: Minumum of 1-2 x/day, 30 to 90  minutes Frequency: 1 to 3 out of 7 days Duration/Length of Stay: 4/11 Treatment/Interventions: Dysphagia/aspiration precaution training;Patient/family education;Therapeutic Activities   Daily Session  Skilled Therapeutic Interventions:  Skilled treatment session focused on dysphagia goals. SLP facilitated session by providing skilled observation with breakfast meal of regular textures and thin liquids with minimal overt s/s of aspiration and Mod I for use of swallowing compensatory strategies. Overall, patient demonstrated increased ability to utilize swallowing compensatory strategy in a mildly distracting environment. Patient left supine in bed with all needs within reach. Continue with current plan of care.      Function:   Eating Eating   Modified Consistency Diet: No Eating Assist Level: Swallowing techniques: self managed           Cognition Comprehension Comprehension assist level: Understands complex 90% of the time/cues 10% of the time  Expression   Expression assist level: Expresses complex 90% of the time/cues < 10% of the time  Social Interaction Social Interaction assist level: Interacts appropriately with others with medication or extra time (anti-anxiety, antidepressant).  Problem Solving Problem solving assist level: Solves complex 90% of the time/cues < 10% of the time  Memory Memory assist level: More than reasonable amount of time   Pain Pain Assessment Pain Assessment: No/denies pain  Therapy/Group: Individual Therapy  [name] 07/06/2016, 12:33 PM

## 2016-07-06 NOTE — Progress Notes (Signed)
Physical Therapy Session Note  Patient Details  Name: [name] MRN: [id number] Date of Birth: July 14, 1945  Today's Date: 07/06/2016 PT Individual Time: 0900-0945 PT Individual Time Calculation (min): 45 min   Short Term Goals: Week 2:  PT Short Term Goal 1 (Week 2): Pt will reorient to midline with min verbal cues in 50% of observed opportunities PT Short Term Goal 2 (Week 2): Pt will ambulate 100' with LRAD and mod assist PT Short Term Goal 3 (Week 2): Pt will negotiate 4 steps with 1 rail and mod assist   Skilled Therapeutic Interventions/Progress Updates:   Patient in bed upon arrival. Transferred to EOB using rail with momentum resulting in 7/10 dizziness. Patient performed sit <> stand transfers with BUE support on R knee ascending and reaching back with RUE descending and supervision. Gait using RW x 105 ft + 75 ft + 25 ftwith supervision overall, patient making multiple self corrections independently and 1 episode of steady assist due to forward LOB. Bed mobility on regular bed in ADL apartment with focus on slow, controlled movement and pausing with each transition with supervision and patient reports decreased dizziness. Patient negotiated up/down 8 (6") steps laterally with BUE support using L rail ascending to simulate home entry with supervision-steady assist. Standing on foam wedge with initial BUE support > single UE support > no support to challenge static progressed to dynamic standing balance with alternating arm raises and challenge ankle balance strategy with close supervision-steady assist. Stand pivot transfer to wheelchair using RW to L with supervision. Patient left sitting in wheelchair with all needs in reach.   Therapy Documentation Precautions:  Precautions Precautions: Fall Precaution Comments: dizzy, poor postural control Restrictions Weight Bearing Restrictions: No Pain: Pain Assessment Pain Assessment: No/denies pain   See Function Navigator for  Current Functional Status.   Therapy/Group: Individual Therapy  Keyshla Tunison, Murray Hodgkins 07/06/2016, 10:49 AM

## 2016-07-06 NOTE — Progress Notes (Signed)
Occupational Therapy Weekly Progress Note  Patient Details  Name: [name] MRN: [id number] Date of Birth: 23-Jun-1945  Beginning of progress report period: June 22, 2016 End of progress report period: July 06, 2016  Today's Date: 07/06/2016  Session 1 OT Individual Time: 7622-6333 OT Individual Time Calculation (min): 63 min   Session 2 OT Individual Time: 1400-1500 OT Individual Time Calculation (min): 60 min   Patient has met 4 of 4 short term goals.  Pt is making steady progress with OT treatments at this time.  He is able to complete stand pivot and squat pivot transfers consistently with min guard assist.   Pt has progressed to a supervision/ Min A level for bathing and dressing tasks.  He continues to need min A for toileting 2/2 difficulty maintaining balance with trunk rotation to reach behind, but is progressing using toilet aid. Standing balance, requiring supervision for static and min guard A for dynamic balance. Feel he is on target to reach modified independent/supervision  level goals for discharge home next week.  Will continue with current OT treatment POC.    Patient continues to demonstrate the following deficits: muscle weakness, impaired timing and sequencing, ataxia and decreased coordination, diploplia and decreased sitting balance, decreased standing balance, decreased postural control and decreased balance strategies and therefore will continue to benefit from skilled OT intervention to enhance overall performance with BADL.  Patient progressing toward long term goals..  Continue plan of care.  OT Short Term Goals Week 2:  OT Short Term Goal 1 (Week 2): Pt will consistently complete toilet transfer with min A OT Short Term Goal 1 - Progress (Week 2): Met OT Short Term Goal 2 (Week 2): Pt will tolerate standing at the sink for 2 grooming tasks with min A for balance OT Short Term Goal 2 - Progress (Week 2): Met OT Short Term Goal 3 (Week 2): Pt will complete  LB dressing sit<>stand with min A OT Short Term Goal 3 - Progress (Week 2): Met OT Short Term Goal 4 (Week 2): Pt will complete tub shower transfer with min A OT Short Term Goal 4 - Progress (Week 2): Met Week 3:  OT Short Term Goal 1 (Week 3): LTG=STG 2/2 estimated LOS  Skilled Therapeutic Interventions/Progress Updates:  Session 1   OT treatment session focused on modified bathing/dressing, toileting strategies, and balance within sitting and standing tasks. Stand-pivot transfer w/c > raised toilet with min A and heavy use of grab bars. Continued to problem solve toileting strategies- utilized long-handled sponge with wash cloth tied around sponge, then turned to R to reach with long-handled. Pt successful with this technique and will continue to practice. Stand-pivot back to w/c, then to shower. Pt completed bathing tasks with overall set-up and min guard A for balance. LB dressing completed in simulated home environment at EOB.VC to maintain unilateral UE support on RW when standing to pull up pants 2/2 lateral LOB. OT educated pt on how to don L ankle air cast and re-iterated importance of removing cast at night to prevent skin breakdown. OT helped pt draw out home bathroom set-up for better understanding of DME needs. Pt left seated EOB at end of session with needs met.   Session 2 OT treatment session focused on dynamic standing balance, transfer training, home modifications/DC planning. Pt brought to therapy apartment and completed stand-step turn transfer to tub bench with min A and RW. Pt then able to scoot and swing legs into tub with supervision. Pt  returned to wc in similar fashion.  Worked on knee bend strategies with trunk rotation to R, then L using horse shoe reaching activity-in preparation for toileting strategies. Pt returned to room and called spouse to confirm home bathroom measurements. Pt's spouse able to confirm wide bariatric BSC and tub transfer bench will fit in pt's bathroom.  Stand-pivot to return to bed w/ close supervision and pt left with needs met.  Therapy Documentation Precautions:  Precautions Precautions: Fall Precaution Comments: dizzy, poor postural control Restrictions Weight Bearing Restrictions: No Pain:  denies pain ADL: ADL ADL Comments: Please see functional navigator  See Function Navigator for Current Functional Status.   Therapy/Group: Individual Therapy  Valma Cava 07/06/2016, 3:46 PM

## 2016-07-07 ENCOUNTER — Inpatient Hospital Stay (HOSPITAL_COMMUNITY): Payer: Self-pay | Admitting: Physical Therapy

## 2016-07-07 ENCOUNTER — Inpatient Hospital Stay (HOSPITAL_COMMUNITY): Payer: Medicare Other | Admitting: Speech Pathology

## 2016-07-07 ENCOUNTER — Inpatient Hospital Stay (HOSPITAL_COMMUNITY): Payer: Self-pay | Admitting: Occupational Therapy

## 2016-07-07 NOTE — Progress Notes (Signed)
Subjective/Complaints:  Pt states head turn to Left really works, has been able to ascend and descend steps at home  ROS: pt denies nausea, vomiting, diarrhea, cough, shortness of breath or chest pain   Objective: Vital Signs: Blood pressure 128/67, pulse (!) 54, temperature 97.9 F (36.6 C), temperature source Oral, resp. rate 16, height 6' (1.829 m), weight (!) 156 kg (343 lb 14.7 oz), SpO2 98 %. No results found. No results found for this or any previous visit (from the past 72 hour(s)).   HEENT: vertical diplopia in all visual fields, AT, [place] Cardio: RRR Resp: CTA B/L GI: ND, BS+ Extremity:  No Edema Skin:   Intact Neuro: Alert/Oriented, Normal Sensory and Other 5/5 BUE and 4/5 BLE, voice is hoarse esp after swallowing solids Musc/Skel:  Normal, no Left ankle ecchymosis mild swelling  Gen NAD, obese   Assessment/Plan: 1. 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 Physiatrist is providing close team supervision and 24 hour management of active medical problems listed below. Physiatrist and rehab team continue to assess barriers to discharge/monitor patient progress toward functional and medical goals. FIM: Function - Bathing Position: Shower Body parts bathed by patient: Chest, Left arm, Right arm, Abdomen, Front perineal area, Right upper leg, Left upper leg, Right lower leg, Left lower leg Body parts bathed by helper: Buttocks Assist Level: Touching or steadying assistance(Pt > 75%) Assistive Device Comment: Long handled sponge  Function- Upper Body Dressing/Undressing What is the patient wearing?: Pull over shirt/dress Pull over shirt/dress - Perfomed by patient: Thread/unthread right sleeve, Thread/unthread left sleeve, Pull shirt over trunk, Put head through opening Pull over shirt/dress - Perfomed by helper: Pull shirt over trunk Assist Level: Set  up Assistive Device Comment: long handled sponge Set up : To obtain clothing/put away Function - Lower Body Dressing/Undressing What is the patient wearing?: Pants, Underwear, Socks, Shoes Position: Sitting EOB Underwear - Performed by patient: Thread/unthread right underwear leg, Thread/unthread left underwear leg, Pull underwear up/down Underwear - Performed by helper: Pull underwear up/down Pants- Performed by patient: Thread/unthread right pants leg, Thread/unthread left pants leg, Pull pants up/down Pants- Performed by helper: Pull pants up/down Non-skid slipper socks- Performed by patient: Don/doff right sock, Don/doff left sock Non-skid slipper socks- Performed by helper: Don/doff right sock, Don/doff left sock Socks - Performed by patient: Don/doff right sock, Don/doff left sock Shoes - Performed by patient: Don/doff right shoe, Don/doff left shoe, Fasten right, Fasten left Shoes - Performed by helper: Don/doff right shoe, Don/doff left shoe, Fasten right, Fasten left Assist for footwear: Supervision/touching assist Assist for lower body dressing: Touching or steadying assistance (Pt > 75%) Assistive Device Comment: reacher  Function - Toileting Toileting activity did not occur: No continent bowel/bladder event Toileting steps completed by patient: Adjust clothing prior to toileting, Performs perineal hygiene, Adjust clothing after toileting Toileting steps completed by helper: Performs perineal hygiene Toileting Assistive Devices: Grab bar or rail, Toilet aid Assist level: Touching or steadying assistance (Pt.75%)  Function - Air cabin crew transfer activity did not occur: Safety/medical concerns Toilet transfer assistive device: Elevated toilet seat/BSC over toilet Assist level to toilet: Touching or steadying assistance (Pt > 75%) Assist level from toilet: Touching or steadying assistance (Pt > 75%)  Function - Chair/bed transfer Chair/bed transfer method:  Ambulatory Chair/bed transfer assist level: Supervision or verbal cues Chair/bed transfer assistive device: Armrests, Walker Chair/bed transfer details: Verbal cues for precautions/safety  Function -  Locomotion: Wheelchair Will patient use wheelchair at discharge?: No Type: Manual Max wheelchair distance: 150 Assist Level: No help, No cues, assistive device, takes more than reasonable amount of time Assist Level: No help, No cues, assistive device, takes more than reasonable amount of time Assist Level: No help, No cues, assistive device, takes more than reasonable amount of time Turns around,maneuvers to table,bed, and toilet,negotiates 3% grade,maneuvers on rugs and over doorsills: Yes Function - Locomotion: Ambulation Assistive device: Walker-rolling Max distance: 105 ft Assist level: Touching or steadying assistance (Pt > 75%) Assist level: Touching or steadying assistance (Pt > 75%) Walk 50 feet with 2 turns activity did not occur: Safety/medical concerns Assist level: Touching or steadying assistance (Pt > 75%) Walk 150 feet activity did not occur: Safety/medical concerns Walk 10 feet on uneven surfaces activity did not occur: Safety/medical concerns  Function - Comprehension Comprehension: Auditory Comprehension assistive device: Hearing aids Comprehension assist level: Understands complex 90% of the time/cues 10% of the time  Function - Expression Expression: Verbal Expression assist level: Expresses complex 90% of the time/cues < 10% of the time  Function - Social Interaction Social Interaction assist level: Interacts appropriately 90% of the time - Needs monitoring or encouragement for participation or interaction.  Function - Problem Solving Problem solving assist level: Solves complex 90% of the time/cues < 10% of the time  Function - Memory Memory assist level: More than reasonable amount of time Patient normally able to recall (first 3 days only): Current season,  Location of own room, Staff names and faces, That he or she is in a hospital  Medical Problem List and Plan: 1.  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  DVT Prophylaxis/Anticoagulation: Eliquis 3. Pain Management: Tylenol as needed, consider cont prn Tramadol 4. Mood: Risperdal 0.5 mg daily at bedtime, Zoloft 150 mg daily--home meds 5. Neuropsych: This patient is capable of making decisions on his own behalf. 6. Skin/Wound Care: Routine skin checks 7. Fluids/Electrolytes/Nutrition: Routine I&O with follow-up chemistries 8. Dysphagia. Mechanical soft thin liquid diet. Follow-up speech therapy. Monitor for any signs of aspiration 9. Atrial fibrillation with history of pulmonary emboli. Tambocor 50 mg every 12 hours. Cardiac rate controlled. 10. OSA/CPAP. Check oxygen saturations every shift, sleeping well 11. Diastolic congestive heart failure. Monitor for any signs of fluid overload. Lasix 40 mg daily   12. Hypertension: Monitor BP, cont Lasix- good range 4/5 Vitals:   07/06/16 1205 07/07/16 0513  BP: 132/62 128/67  Pulse: (!) 56 (!) 54  Resp: 17 16  Temp: 98.7 F (37.1 C) 97.9 F (36.6 C)   13. Morbid obesity. BMI 48.56. Dietary follow-up,                14. BPH. Proscar 5 mg daily.no retention       15. Hyperlipidemia. Lipitor 16. History of bilateral TKA. Patient used a Programmer, multimedia prior to admission 17.  Constipation-uses dulcolax at home -senokot -s bid  -sorbitol prn 18.  Hx nasal congestion takes loratadine  19.  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

## 2016-07-07 NOTE — Progress Notes (Signed)
Physical Therapy Weekly Progress Note  Patient Details  Name: [name] MRN: [id number] Date of Birth: 1945-04-21  Beginning of progress report period: June 30, 2016 End of progress report period: July 07, 2016  Today's Date: 07/07/2016 PT Individual Time: 1415-1530 PT Individual Time Calculation (min): 75 min   Patient has met 3 of 3 short term goals.  Pt has made excellent progress with therapy this week and is currently performing all mobility at a supervision level.  Pt is able to reorient to midline independently and has progressed in his ambulation to be able to ambulate to and from therapy gym without rest breaks.    Patient continues to demonstrate the following deficits muscle weakness, decreased coordination and decreased motor planning, decreased midline orientation and decreased standing balance, decreased postural control and decreased balance strategies and therefore will continue to benefit from skilled PT intervention to increase functional independence with mobility.  Patient progressing toward long term goals..  Continue plan of care.  PT Short Term Goals Week 2:  PT Short Term Goal 1 (Week 2): Pt will reorient to midline with min verbal cues in 50% of observed opportunities PT Short Term Goal 1 - Progress (Week 2): Met PT Short Term Goal 2 (Week 2): Pt will ambulate 100' with LRAD and mod assist PT Short Term Goal 2 - Progress (Week 2): Met PT Short Term Goal 3 (Week 2): Pt will negotiate 4 steps with 1 rail and mod assist PT Short Term Goal 3 - Progress (Week 2): Met Week 3:  PT Short Term Goal 1 (Week 3): =LTGs due ELOS  Skilled Therapeutic Interventions/Progress Updates:    no c/o pain.  Session focus on ambulation in controlled and simulated home environment with RW, transfers, standing balance, and balance strategies.    Pt transfers sit<>stand with RW with supervision, using BUE support on RLE for forced weight shift R.  Requires verbal cues for pacing  when performing ambulatory transfers as pt tends to speed up when approaching sitting surface.  Gait training to and from therapy gym with close supervision, pt demos reciprocal gait pattern in straight pathway and step-to pattern when negotiating turns.  High level gait training through cones with RW and steady assist/tactile cues for orientation to midline.  Standing balance on 4" foam wedge during horse shoes task and during Wii bowling focus on ankle/hip strategy, standing tolerance, and midline orientation during dynamic tasks.  Pt returned to room at end of session and positioned EOB.  PT applied co-band wrap to leg rests to prevent DTI to pt's calves from locking mechanisms.  Discussed use of ELRs at d/c for improved sitting tolerance and decreased risk of skin breakdown/tissue injury.  Pt left with call bell in reach and needs met.   Therapy Documentation Precautions:  Precautions Precautions: Fall Precaution Comments: dizzy, poor postural control Restrictions Weight Bearing Restrictions: No   See Function Navigator for Current Functional Status.  Therapy/Group: Individual Therapy  [name] 07/07/2016, 4:33 PM

## 2016-07-07 NOTE — Progress Notes (Signed)
Occupational Therapy Session Note  Patient Details  Name: [name] MRN: [id number] Date of Birth: [birth year]  Today's Date: 07/07/2016 OT Individual Time: 3810-1751 OT Individual Time Calculation (min): 75 min   Short Term Goals: Week 2:  OT Short Term Goal 1 (Week 2): Pt will consistently complete toilet transfer with min A OT Short Term Goal 1 - Progress (Week 2): Met OT Short Term Goal 2 (Week 2): Pt will tolerate standing at the sink for 2 grooming tasks with min A for balance OT Short Term Goal 2 - Progress (Week 2): Met OT Short Term Goal 3 (Week 2): Pt will complete LB dressing sit<>stand with min A OT Short Term Goal 3 - Progress (Week 2): Met OT Short Term Goal 4 (Week 2): Pt will complete tub shower transfer with min A OT Short Term Goal 4 - Progress (Week 2): Met  Skilled Therapeutic Interventions/Progress Updates:    OT treatment session focused on trunk rotation, balance strategies, standing balance/endurance, coordination, and core strengthening. Pt brought into quadruped position, alternating UE reach + hold for 5 seconds x10. Maintained quadruped and moved to LE hip extension in quadruped w/ 5 second hold 5x 2 sets. Trunk rotation and sitting balance using medium size ball pass R <>L. Standing trunk rotation to reach objects behind,  incorporating knee bend strategies with alternating UE horse shoe activity. Stand-step turn transfer back to wc w/ RW and CGA. Returned to room and reported need for bathroom. Stand-pivot w/c > toilet with CGA. Set pt up with long-handled sponge and wash cloths for toileting.   Therapy Documentation Precautions:  Precautions Precautions: Fall Precaution Comments: dizzy, poor postural control Restrictions Weight Bearing Restrictions: No Pain:  denies pain ADL: ADL ADL Comments: Please see functional navigator  See Function Navigator for Current Functional Status.   Therapy/Group: Individual Therapy  Valma Cava 07/07/2016,  8:45 AM

## 2016-07-07 NOTE — Progress Notes (Signed)
Pt. Has home cpap, able to place on himself when ready.

## 2016-07-07 NOTE — Progress Notes (Signed)
Speech Language Pathology Daily Session Note  Patient Details  Name: [name] MRN: [id number] Date of Birth: 1945/05/13  Today's Date: 07/07/2016 SLP Individual Time: 1205-1245 SLP Individual Time Calculation (min): 40 min  Short Term Goals: Week 3: SLP Short Term Goal 1 (Week 3): Patient will recall and utilize left head turn and tuck with current diet with minimal overt s/s of aspiration with Mod I.   Skilled Therapeutic Interventions: Skilled treatment session focused on dysphagia goals. SLP facilitated session by providing skilled observation with lunch meal of regular textures and thin liquids with minimal overt s/s of aspiration and verbal cue X 1 for use of swallowing compensatory strategy (patient reading schedule while swallowing).  Patient left upright in wheelchair with all needs within reach. Continue with current plan of care.                                 Function:  Eating Eating   Modified Consistency Diet: No Eating Assist Level: Swallowing techniques: self managed           Cognition Comprehension Comprehension assist level: Understands complex 90% of the time/cues 10% of the time  Expression   Expression assist level: Expresses complex 90% of the time/cues < 10% of the time  Social Interaction Social Interaction assist level: Interacts appropriately 90% of the time - Needs monitoring or encouragement for participation or interaction.  Problem Solving Problem solving assist level: Solves complex 90% of the time/cues < 10% of the time  Memory Memory assist level: More than reasonable amount of time    Pain No/Denies Pain   Therapy/Group: Individual Therapy  [name] 07/07/2016, 12:39 PM

## 2016-07-08 ENCOUNTER — Inpatient Hospital Stay (HOSPITAL_COMMUNITY): Payer: Medicare Other | Admitting: Occupational Therapy

## 2016-07-08 ENCOUNTER — Inpatient Hospital Stay (HOSPITAL_COMMUNITY): Payer: Self-pay | Admitting: Occupational Therapy

## 2016-07-08 DIAGNOSIS — I10 Essential (primary) hypertension: Secondary | ICD-10-CM

## 2016-07-08 DIAGNOSIS — E876 Hypokalemia: Secondary | ICD-10-CM

## 2016-07-08 DIAGNOSIS — D72829 Elevated white blood cell count, unspecified: Secondary | ICD-10-CM

## 2016-07-08 NOTE — Progress Notes (Signed)
Subjective/Complaints: Pt seen laying in bed this AM.  He did not sleep well overnight because his heating pad broke and wet his bed.    ROS: Denies CP, SOB, N/V/D.   Objective: Vital Signs: Blood pressure 136/74, pulse (!) 58, temperature 98.3 F (36.8 C), temperature source Oral, resp. rate 16, height 6' (1.829 m), weight (!) 157 kg (346 lb 2 oz), SpO2 100 %. No results found. No results found for this or any previous visit (from the past 72 hour(s)).   HEENT: AT, [place] Cardio: RRR. No JVD. Resp: CTA B/L. Unlabored.  GI: ND, BS+ Skin:   Intact. Warm and dry. Neuro: Alert/Oriented Motor: 5/5 BUE and 4+/5 BLE No ataxia Musc/Skel:  No edema. No tenderness Gen NAD, obese   Assessment/Plan: 1. 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 Physiatrist is providing close team supervision and 24 hour management of active medical problems listed below. Physiatrist and rehab team continue to assess barriers to discharge/monitor patient progress toward functional and medical goals. FIM: Function - Bathing Position: Shower Body parts bathed by patient: Chest, Left arm, Right arm, Abdomen, Front perineal area, Right upper leg, Left upper leg, Right lower leg, Left lower leg Body parts bathed by helper: Buttocks Assist Level: Touching or steadying assistance(Pt > 75%) Assistive Device Comment: Long handled sponge  Function- Upper Body Dressing/Undressing What is the patient wearing?: Pull over shirt/dress Pull over shirt/dress - Perfomed by patient: Thread/unthread right sleeve, Thread/unthread left sleeve, Pull shirt over trunk, Put head through opening Pull over shirt/dress - Perfomed by helper: Pull shirt over trunk Assist Level: Set up Assistive Device Comment: long handled sponge Set up : To obtain clothing/put away Function - Lower Body Dressing/Undressing What is  the patient wearing?: Pants, Underwear, Socks, Shoes Position: Sitting EOB Underwear - Performed by patient: Thread/unthread right underwear leg, Thread/unthread left underwear leg, Pull underwear up/down Underwear - Performed by helper: Pull underwear up/down Pants- Performed by patient: Thread/unthread right pants leg, Thread/unthread left pants leg, Pull pants up/down Pants- Performed by helper: Pull pants up/down Non-skid slipper socks- Performed by patient: Don/doff right sock, Don/doff left sock Non-skid slipper socks- Performed by helper: Don/doff right sock, Don/doff left sock Socks - Performed by patient: Don/doff right sock, Don/doff left sock Shoes - Performed by patient: Don/doff right shoe, Don/doff left shoe, Fasten right, Fasten left Shoes - Performed by helper: Don/doff right shoe, Don/doff left shoe, Fasten right, Fasten left Assist for footwear: Supervision/touching assist Assist for lower body dressing: Touching or steadying assistance (Pt > 75%) Assistive Device Comment: reacher  Function - Toileting Toileting activity did not occur: No continent bowel/bladder event Toileting steps completed by patient: Adjust clothing prior to toileting, Performs perineal hygiene, Adjust clothing after toileting Toileting steps completed by helper: Performs perineal hygiene Toileting Assistive Devices: Grab bar or rail, Toilet aid Assist level: Touching or steadying assistance (Pt.75%)  Function - Air cabin crew transfer activity did not occur: Safety/medical concerns Toilet transfer assistive device: Elevated toilet seat/BSC over toilet Assist level to toilet: Touching or steadying assistance (Pt > 75%) Assist level from toilet: Touching or steadying assistance (Pt > 75%)  Function - Chair/bed transfer Chair/bed transfer method: Stand pivot, Ambulatory Chair/bed transfer assist level: Supervision or verbal cues Chair/bed transfer assistive device: Armrests,  Walker Chair/bed transfer details: Verbal cues for precautions/safety  Function - Locomotion: Wheelchair Will patient use wheelchair at discharge?: No Type: Manual Max wheelchair distance:  150 Assist Level: No help, No cues, assistive device, takes more than reasonable amount of time Assist Level: No help, No cues, assistive device, takes more than reasonable amount of time Assist Level: No help, No cues, assistive device, takes more than reasonable amount of time Turns around,maneuvers to table,bed, and toilet,negotiates 3% grade,maneuvers on rugs and over doorsills: Yes Function - Locomotion: Ambulation Assistive device: Walker-rolling Max distance: 150 Assist level: Supervision or verbal cues Assist level: Supervision or verbal cues Walk 50 feet with 2 turns activity did not occur: Safety/medical concerns Assist level: Supervision or verbal cues Walk 150 feet activity did not occur: Safety/medical concerns Assist level: Supervision or verbal cues Walk 10 feet on uneven surfaces activity did not occur: Safety/medical concerns  Function - Comprehension Comprehension: Auditory Comprehension assistive device: Hearing aids Comprehension assist level: Understands complex 90% of the time/cues 10% of the time  Function - Expression Expression: Verbal Expression assist level: Expresses complex 90% of the time/cues < 10% of the time  Function - Social Interaction Social Interaction assist level: Interacts appropriately 90% of the time - Needs monitoring or encouragement for participation or interaction.  Function - Problem Solving Problem solving assist level: Solves complex 90% of the time/cues < 10% of the time  Function - Memory Memory assist level: More than reasonable amount of time Patient normally able to recall (first 3 days only): Current season, Location of own room, Staff names and faces, That he or she is in a hospital  Medical Problem List and Plan: 1.  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  DVT Prophylaxis/Anticoagulation: Eliquis 3. Pain Management: Tylenol as needed, consider cont prn Tramadol 4. Mood: Risperdal 0.5 mg daily at bedtime, Zoloft 150 mg daily--home meds 5. Neuropsych: This patient is capable of making decisions on his own behalf. 6. Skin/Wound Care: Routine skin checks 7. Fluids/Electrolytes/Nutrition: Routine I&Os 8. Dysphagia. Advanced to regular diet. Follow-up speech therapy. Monitor for any signs of aspiration 9. Atrial fibrillation with history of pulmonary emboli. Tambocor 50 mg every 12 hours. Cardiac rate controlled. 10. OSA/CPAP. Check oxygen saturations every shift 11. Diastolic congestive heart failure. Monitor for any signs of fluid overload. Lasix 40 mg daily 12. Hypertension: Monitor BP, cont Lasix  Controlled 4/7 Vitals:   07/07/16 1525 07/08/16 0524  BP: 120/67 136/74  Pulse: 62 (!) 58  Resp: 18 16  Temp: 98 F (36.7 C) 98.3 F (36.8 C)   13. Morbid obesity. BMI 48.56. Dietary follow-up  14. BPH. Proscar 5 mg daily.no retention   15. Hyperlipidemia. Lipitor 16. History of bilateral TKA. Patient used a Programmer, multimedia prior to admission 17.  Constipation-uses dulcolax at home  -senokot -s bid  -sorbitol prn 18.  Hx nasal congestion takes loratadine  19.  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 Leukocytosis  Labs ordered for Monday 22. Hypokalemia  Labs ordered for Monday 23. Bun/Cr trending up  Labs ordered for Monday   LOS (Days) 17 A FACE TO FACE EVALUATION WAS PERFORMED  Lucciano Vitali Lorie Phenix 07/08/2016, 7:02 AM

## 2016-07-08 NOTE — Progress Notes (Signed)
Occupational Therapy Session Note  Patient Details  Name: [name] MRN: [id number] Date of Birth: 10/28/1945  Today's Date: 07/08/2016 OT Individual Time: 8159-4707 OT Individual Time Calculation (min): 54 min    Short Term Goals: Week 1:  OT Short Term Goal 1 (Week 1): Pt will maintain sitting balance while performing ADL task with supervision OT Short Term Goal 1 - Progress (Week 1): Met OT Short Term Goal 2 (Week 1): Pt will complete toilet transfer with min A OT Short Term Goal 2 - Progress (Week 1): Progressing toward goal OT Short Term Goal 3 (Week 1): Pt will tolerate standing at the sink for 2 grooming tasks with min A for balance OT Short Term Goal 3 - Progress (Week 1): Progressing toward goal  Skilled Therapeutic Interventions/Progress Updates:    Upon entering the room, pt supine in bed with no c/o pain this session. Pt reports feeling much better this afternoon. Pt performed supine >sit with supervision. Sit <>stand without use of B UE's with overall supervision. Pt ambulating 10' with RW and steady assistance into bathroom for toileting. Pt needing steady assistance for balance during clothing management and hygiene this session. Pt ambulating and standing at sink for 10 minutes while washing hands, combing hair, brushing teeth, and shaving face with overall supervision and utilized L UE for support. Pt taking seated rest break. OT providing education regarding discharge recommendations, expectations, and follow up. Pt verbalized understanding and agreement. OT demonstrated B UE chair push ups with pt returning demonstration x 2 sets of 10 reps. Min verbal cues for pursed lip breathing with task. Pt returning to bed at end of session. Call bell and all needed items within reach upon exiting the room.   Therapy Documentation Precautions:  Precautions Precautions: Fall Precaution Comments: dizzy, poor postural control Restrictions Weight Bearing Restrictions:  No General:   Vital Signs:  Pain: Pain Assessment Pain Assessment: No/denies pain ADL: ADL ADL Comments: Please see functional navigator Exercises:   Other Treatments:    See Function Navigator for Current Functional Status.   Therapy/Group: Individual Therapy  Gypsy Decant 07/08/2016, 1:55 PM

## 2016-07-08 NOTE — Progress Notes (Signed)
Occupational Therapy Session Note  Patient Details  Name: [name] MRN: [id number] Date of Birth: April 11, 1945  Today's Date: 07/08/2016 OT Individual Time: 9379-0240 OT Individual Time Calculation (min): 72 min    Short Term Goals: Week 3:  OT Short Term Goal 1 (Week 3): LTG=STG 2/2 estimated LOS  Skilled Therapeutic Interventions/Progress Updates:    Upon entering the room, pt supine in bed with no c/o pain. However, pt reports he did not sleep well and does not feel well. Pt agreeable to OT intervention with coaxing this session. Pt performed supine >sit with supervision and increased time. Eye patch donned over L eye and pt reports feeling dizzy with movement. BP taken with results of 141/72. Pt declined ambulation this session and transferred with RW into wheelchair with steady assistance. Pt propelled wheelchair with supervision to tub room. OT providing demonstration and education pt on recommendations for shower at home. OT recommends safety treads to decrease fall risk, TTB, non slip bath mat placed at tub, hand held shower head, and pt requests grab bars for aging in place. Pt verbalized understanding and agreement. Pt propels self back to room in same manner. Pt eating breakfast while OT reviews energy conservation education. Paper handouts provided as well. Pt reports feeling slightly better and is planning to return to bed after breakfast completed. Call bell and all needed items within reach upon exiting the room.   Therapy Documentation Precautions:  Precautions Precautions: Fall Precaution Comments: dizzy, poor postural control Restrictions Weight Bearing Restrictions: No General:   Vital Signs: Therapy Vitals Temp: 98.3 F (36.8 C) Temp Source: Oral Pulse Rate: (!) 58 Resp: 16 BP: 136/74 Patient Position (if appropriate): Sitting Oxygen Therapy SpO2: 100 % O2 Device: Not Delivered Pain:   ADL: ADL ADL Comments: Please see functional  navigator Exercises:   Other Treatments:    See Function Navigator for Current Functional Status.   Therapy/Group: Individual Therapy  Gypsy Decant 07/08/2016, 8:16 AM

## 2016-07-09 ENCOUNTER — Inpatient Hospital Stay (HOSPITAL_COMMUNITY): Payer: Medicare Other | Admitting: Physical Therapy

## 2016-07-09 DIAGNOSIS — I5032 Chronic diastolic (congestive) heart failure: Secondary | ICD-10-CM

## 2016-07-09 NOTE — Progress Notes (Addendum)
Physical Therapy Session Note  Patient Details  Name: [name] MRN: [id number] Date of Birth: 12-02-1945  Today's Date: 07/09/2016 PT Individual Time: 0905-1000 PT Individual Time Calculation (min): 55 min   Short Term Goals: Week 3:  PT Short Term Goal 1 (Week 3): =LTGs due ELOS  Skilled Therapeutic Interventions/Progress Updates:  Pt received in bed & agreeable to tx. Pt transferred supine<>sitting EOB & donned B shoes (L aircast already donned). Pt reports need to use restroom and ambulates within room<>bathroom with RW & min assist with slight L lean. Pt manages clothing with min assist for standing balance and transfers sit<>stand with steady assist/supervision; pt with continent void on toilet. Pt returned to w/c and performed hand hygiene at sink from w/c level. Pt propelled w/c throughout unit with BUE & supervision with task focusing on w/c mobility; pt able to negotiate small doorways and manage w/c parts without assistance. Pt performed car transfer at small SUV simulated height via squat pivot w/c<>car and stand pivot w/c<>car with RW with supervision and steady assist respectively. Recommended that pt use small SUV to ride home in versus low sedan or son's elevated truck & pt voiced understanding. Gait training x 120 + 120 ft + 150 ft with RW & min assist with pt demonstrating slight lateral lean but pt able to correct this with occasional cuing from therapist. Pt also able to correct gait pattern without cuing. Pt does demonstrate downward gaze despite cuing to look up. At end of session pt left in bed with alarm set & all needs within reach.  Therapy Documentation Precautions:  Precautions Precautions: Fall Precaution Comments: dizzy, poor postural control Restrictions Weight Bearing Restrictions: No  Pain: Denies c/o pain.   See Function Navigator for Current Functional Status.   Therapy/Group: Individual Therapy  [name] 07/09/2016, 10:03 AM

## 2016-07-09 NOTE — Progress Notes (Signed)
Subjective/Complaints: Pt seen laying in bed this AM.  He slept well overnight.  He wants to know what he can control to prevent another stroke.   ROS: Denies CP, SOB, N/V/D.  Objective: Vital Signs: Blood pressure 120/62, pulse (!) 52, temperature 98 F (36.7 C), temperature source Oral, resp. rate 19, height 6' (1.829 m), weight (!) 154.2 kg (340 lb), SpO2 98 %. No results found. No results found for this or any previous visit (from the past 72 hour(s)).   HEENT: AT, [place] Cardio: RRR. No JVD. Resp: CTA B/L. Unlabored.  GI: ND, BS+ Skin:   Intact. Warm and dry. Neuro: Alert/Oriented Motor: 5/5 BUE and 4+/5 BLE No ataxia Musc/Skel:  No edema. No tenderness Gen NAD, obese   Assessment/Plan: 1. 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 Physiatrist is providing close team supervision and 24 hour management of active medical problems listed below. Physiatrist and rehab team continue to assess barriers to discharge/monitor patient progress toward functional and medical goals. FIM: Function - Bathing Position: Shower Body parts bathed by patient: Chest, Left arm, Right arm, Abdomen, Front perineal area, Right upper leg, Left upper leg, Right lower leg, Left lower leg Body parts bathed by helper: Buttocks Assist Level: Touching or steadying assistance(Pt > 75%) Assistive Device Comment: Long handled sponge  Function- Upper Body Dressing/Undressing What is the patient wearing?: Pull over shirt/dress Pull over shirt/dress - Perfomed by patient: Thread/unthread right sleeve, Thread/unthread left sleeve, Pull shirt over trunk, Put head through opening Pull over shirt/dress - Perfomed by helper: Pull shirt over trunk Assist Level: Set up Assistive Device Comment: long handled sponge Set up : To obtain clothing/put away Function - Lower Body Dressing/Undressing What is  the patient wearing?: Pants, Underwear, Socks, Shoes Position: Sitting EOB Underwear - Performed by patient: Thread/unthread right underwear leg, Thread/unthread left underwear leg, Pull underwear up/down Underwear - Performed by helper: Pull underwear up/down Pants- Performed by patient: Thread/unthread right pants leg, Thread/unthread left pants leg, Pull pants up/down Pants- Performed by helper: Pull pants up/down Non-skid slipper socks- Performed by patient: Don/doff right sock, Don/doff left sock Non-skid slipper socks- Performed by helper: Don/doff right sock, Don/doff left sock Socks - Performed by patient: Don/doff right sock, Don/doff left sock Shoes - Performed by patient: Don/doff right shoe, Don/doff left shoe, Fasten right, Fasten left Shoes - Performed by helper: Don/doff right shoe, Don/doff left shoe, Fasten right, Fasten left Assist for footwear: Supervision/touching assist Assist for lower body dressing: Touching or steadying assistance (Pt > 75%) Assistive Device Comment: reacher  Function - Toileting Toileting activity did not occur: No continent bowel/bladder event Toileting steps completed by patient: Adjust clothing prior to toileting, Performs perineal hygiene, Adjust clothing after toileting Toileting steps completed by helper: Performs perineal hygiene Toileting Assistive Devices: Grab bar or rail Assist level: Touching or steadying assistance (Pt.75%)  Function - Air cabin crew transfer activity did not occur: Safety/medical concerns Toilet transfer assistive device: Elevated toilet seat/BSC over toilet Assist level to toilet: Touching or steadying assistance (Pt > 75%) Assist level from toilet: Touching or steadying assistance (Pt > 75%)  Function - Chair/bed transfer Chair/bed transfer method: Stand pivot, Ambulatory Chair/bed transfer assist level: Touching or steadying assistance (Pt > 75%) Chair/bed transfer assistive device: Armrests,  Walker Chair/bed transfer details: Verbal cues for precautions/safety  Function - Locomotion: Wheelchair Will patient use wheelchair at discharge?: No Type: Manual Max wheelchair distance: 150  Assist Level: No help, No cues, assistive device, takes more than reasonable amount of time Assist Level: No help, No cues, assistive device, takes more than reasonable amount of time Assist Level: No help, No cues, assistive device, takes more than reasonable amount of time Turns around,maneuvers to table,bed, and toilet,negotiates 3% grade,maneuvers on rugs and over doorsills: Yes Function - Locomotion: Ambulation Assistive device: Walker-rolling Max distance: 150 Assist level: Supervision or verbal cues Assist level: Supervision or verbal cues Walk 50 feet with 2 turns activity did not occur: Safety/medical concerns Assist level: Supervision or verbal cues Walk 150 feet activity did not occur: Safety/medical concerns Assist level: Supervision or verbal cues Walk 10 feet on uneven surfaces activity did not occur: Safety/medical concerns  Function - Comprehension Comprehension: Auditory Comprehension assistive device: Hearing aids Comprehension assist level: Follows basic conversation/direction with no assist  Function - Expression Expression: Verbal Expression assist level: Expresses basic needs/ideas: With no assist  Function - Social Interaction Social Interaction assist level: Interacts appropriately 90% of the time - Needs monitoring or encouragement for participation or interaction.  Function - Problem Solving Problem solving assist level: Solves basic problems with no assist  Function - Memory Memory assist level: Recognizes or recalls 90% of the time/requires cueing < 10% of the time Patient normally able to recall (first 3 days only): Current season, Location of own room, Staff names and faces, That he or she is in a hospital  Medical Problem List and Plan: 1.  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  DVT Prophylaxis/Anticoagulation: Eliquis 3. Pain Management: Tylenol as needed, consider cont prn Tramadol 4. Mood: Risperdal 0.5 mg daily at bedtime, Zoloft 150 mg daily--home meds 5. Neuropsych: This patient is capable of making decisions on his own behalf. 6. Skin/Wound Care: Routine skin checks 7. Fluids/Electrolytes/Nutrition: Routine I&Os 8. Dysphagia. Advanced to regular diet. Follow-up speech therapy. Monitor for any signs of aspiration 9. Atrial fibrillation with history of pulmonary emboli. Tambocor 50 mg every 12 hours. Cardiac rate controlled. 10. OSA/CPAP. Check oxygen saturations every shift 11. Diastolic congestive heart failure. Monitor for any signs of fluid overload. Lasix 40 mg daily Filed Weights   07/07/16 0513 07/08/16 0524 07/09/16 0513  Weight: (!) 156 kg (343 lb 14.7 oz) (!) 157 kg (346 lb 2 oz) (!) 154.2 kg (340 lb)  12. Hypertension: Monitor BP, cont Lasix  Controlled 4/8 Vitals:   07/08/16 2229 07/09/16 0513  BP:  120/62  Pulse: 72 (!) 52  Resp: 18 19  Temp:     13. Morbid obesity. BMI 48.56. Dietary follow-up  14. BPH. Proscar 5 mg daily.no retention   15. Hyperlipidemia. Lipitor 16. History of bilateral TKA. Patient used a Programmer, multimedia prior to admission 17.  Constipation-uses dulcolax at home  -senokot -s bid  -sorbitol prn 18.  Hx nasal congestion takes loratadine  19.  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 Leukocytosis  Labs ordered for Monday 22. Hypokalemia  Labs ordered for Monday 23. Bun/Cr trending up  Labs ordered for Monday   LOS (Days) 18 A FACE TO FACE EVALUATION WAS PERFORMED  [name] 07/09/2016, 6:59 AM

## 2016-07-10 ENCOUNTER — Inpatient Hospital Stay (HOSPITAL_COMMUNITY): Payer: Self-pay | Admitting: Physical Therapy

## 2016-07-10 ENCOUNTER — Inpatient Hospital Stay (HOSPITAL_COMMUNITY): Payer: Medicare Other | Admitting: Occupational Therapy

## 2016-07-10 ENCOUNTER — Inpatient Hospital Stay (HOSPITAL_COMMUNITY): Payer: Medicare Other | Admitting: Speech Pathology

## 2016-07-10 LAB — BASIC METABOLIC PANEL
Anion gap: 10 (ref 5–15)
BUN: 22 mg/dL — ABNORMAL HIGH (ref 6–20)
CHLORIDE: 105 mmol/L (ref 101–111)
CO2: 26 mmol/L (ref 22–32)
CREATININE: 1.06 mg/dL (ref 0.61–1.24)
Calcium: 8.3 mg/dL — ABNORMAL LOW (ref 8.9–10.3)
GFR calc non Af Amer: >60 mL/min (ref >60)
Glucose, Bld: 118 mg/dL — ABNORMAL HIGH (ref 65–99)
Potassium: 3.1 mmol/L — ABNORMAL LOW (ref 3.5–5.1)
Sodium: 141 mmol/L (ref 135–145)

## 2016-07-10 LAB — CBC WITH DIFFERENTIAL/PLATELET
Basophils Absolute: 0 10*3/uL (ref 0.0–0.1)
Basophils Relative: 0 %
EOS ABS: 0.2 10*3/uL (ref 0.0–0.7)
Eosinophils Relative: 3 %
HEMATOCRIT: 41 % (ref 39.0–52.0)
HEMOGLOBIN: 13.2 g/dL (ref 13.0–17.0)
LYMPHS ABS: 1.9 10*3/uL (ref 0.7–4.0)
Lymphocytes Relative: 22 %
MCH: 28.9 pg (ref 26.0–34.0)
MCHC: 32.2 g/dL (ref 30.0–36.0)
MCV: 89.7 fL (ref 78.0–100.0)
MONOS PCT: 8 %
Monocytes Absolute: 0.7 10*3/uL (ref 0.1–1.0)
Neutro Abs: 5.8 10*3/uL (ref 1.7–7.7)
Neutrophils Relative %: 67 %
Platelets: 196 10*3/uL (ref 150–400)
RBC: 4.57 MIL/uL (ref 4.22–5.81)
RDW: 14.4 % (ref 11.5–15.5)
WBC: 8.7 10*3/uL (ref 4.0–10.5)

## 2016-07-10 MED ORDER — POTASSIUM CHLORIDE 20 MEQ/15ML (10%) PO SOLN
40.0000 meq | Freq: Every day | ORAL | Status: DC
  Administered 2016-07-10 – 2016-07-12 (×3): 40 meq via ORAL
  Filled 2016-07-10 (×3): qty 30

## 2016-07-10 NOTE — Progress Notes (Signed)
   07/10/16 2358  Respiratory Severity Assessment  Respiratory History 1  Breath Sounds 1  Respiratory Pattern 0  Cough 0  Chest X Ray 0  O2 Requirements 0  Level of Activity 1  Dyspnea 0  Score Total 3  Aerosolized Bronchodilators  Aerosolized bronchodilator indications COPD exacerbation/maintenance therapy  Aerosolized bronchodilator goals Relief of shortness of SOB;Reduce WOB  Plan of care MDI without spacer  Bronchial Hygiene  Bronchial hygiene indications Bronchial hygiene indicated  Bronchial hygiene goals Clear secretions;Mobilize secretions  Plan of Care Cough & Deep breath  Respiratory Therapy Follow Up Assessment  Assessment follow up date 07/12/16

## 2016-07-10 NOTE — Plan of Care (Signed)
Problem: RH Toilet Transfers Goal: LTG Patient will perform toilet transfers w/assist (OT) LTG: Patient will perform toilet transfers with assist, with/without cues using equipment (OT)  LTG modified to supervision from mod I as pt will need to use RW to ambulate into bathroom versus using w/c for sit pivots.  Problem: RH Tub/Shower Transfers Goal: LTG Patient will perform tub/shower transfers w/assist (OT) LTG: Patient will perform tub/shower transfers with assist, with/without cues using equipment (OT)  LTG modified to supervision from mod I as pt will need to use RW to ambulate into bathroom versus using w/c for sit pivots.

## 2016-07-10 NOTE — Progress Notes (Signed)
Pt has home CPAP in room set up. Informed Pt that if he needed any assistance putting it on to have RN call this RT.

## 2016-07-10 NOTE — Progress Notes (Signed)
Physical Therapy Session Note  Patient Details  Name: [name] MRN: [id number] Date of Birth: 28-Mar-1946  Today's Date: 07/10/2016 PT Individual Time: 1300-1408 PT Individual Time Calculation (min): 68 min   Short Term Goals: Week 3:  PT Short Term Goal 1 (Week 3): =LTGs due ELOS  Skilled Therapeutic Interventions/Progress Updates:    No c/o of pain and pt tolerated session well. The session focused on gait training, stair training, balance, coordination, and functional mobility. Pt was supervision throughout session with transfers sit<>stand. Pt ambulated from room to gym with RW with supervision and steady assist (cue on left side for leaning). Pt negotiated obstacle course around and over obstacles with RW and minA, pt lost balance x1 stepping onto mat in obstacle course, was able to catch self and maintain balance with forward step. Pt performed toe taps on 3" step for 2 x 30 seconds holding on to both rails with standing rest break due to BUE exhaustion. Pt performed toe taps to the side and forward to 3" inch step to focus on gradation of movement, standing balance, and coordination while holding on to both rails and supervision. Pt negotiated stairs x2: 3" x 8 up, 6" x 4 down and up, and 3" x 8 down to focus on BUE strengthening, performance of movement pattern, and activity endurance. Pt was able to demonstrate reciprocal pattern with cueing while on stairs with supervision. Family education was discussed with pt including role of wife when d/c occurs. Wife plans to meet for education and training tomorrow during PT session. Pt ambulated from gym to room with RW and steady assist. Pt left sitting EOB with call bell within reach and needs met.   Therapy Documentation Precautions:  Precautions Precautions: Fall Precaution Comments: dizzy, poor postural control Restrictions Weight Bearing Restrictions: No   See Function Navigator for Current Functional Status.   Therapy/Group:  Individual Therapy  [name] 07/10/2016, 3:02 PM

## 2016-07-10 NOTE — Progress Notes (Signed)
Occupational Therapy Session Note  Patient Details  Name: [name] MRN: [id number] Date of Birth: [birth year]  Today's Date: 07/10/2016 OT Individual Time: 3662-9476 OT Individual Time Calculation (min): 60 min    Short Term Goals: Week 1:  OT Short Term Goal 1 (Week 1): Pt will maintain sitting balance while performing ADL task with supervision OT Short Term Goal 1 - Progress (Week 1): Met OT Short Term Goal 2 (Week 1): Pt will complete toilet transfer with min A OT Short Term Goal 2 - Progress (Week 1): Progressing toward goal OT Short Term Goal 3 (Week 1): Pt will tolerate standing at the sink for 2 grooming tasks with min A for balance OT Short Term Goal 3 - Progress (Week 1): Progressing toward goal Week 2:  OT Short Term Goal 1 (Week 2): Pt will consistently complete toilet transfer with min A OT Short Term Goal 1 - Progress (Week 2): Met OT Short Term Goal 2 (Week 2): Pt will tolerate standing at the sink for 2 grooming tasks with min A for balance OT Short Term Goal 2 - Progress (Week 2): Met OT Short Term Goal 3 (Week 2): Pt will complete LB dressing sit<>stand with min A OT Short Term Goal 3 - Progress (Week 2): Met OT Short Term Goal 4 (Week 2): Pt will complete tub shower transfer with min A OT Short Term Goal 4 - Progress (Week 2): Met Week 3:  OT Short Term Goal 1 (Week 3): LTG=STG 2/2 estimated LOS  Skilled Therapeutic Interventions/Progress Updates:      Pt seen for BADL retraining of toileting, bathing, and dressing with a focus on vertigo management, dynamic balance and adaptive techniques.  Pt sat up from supine with no dizziness and felt that it would be manageable today based on this.  Cued pt to move slowly when reaching down to doff/don socks to avoid fast large transitional movements.   Pt stated that he will need to ambulate into his bathroom at home vs using a w/c so toilet and tub transfer goals modified to supervision with RW. Pt used RW to ambulate into  shower stall with close S and then completed bathing with set up. He uses lateral leans to wash bottom versus sit to stand for safety.  He donned underwear and non slip socks from bench and then ambulated to EOB to dress.  To reach his shoes on the L side of him, he was leaning to the L with R hand propped on walker but L foot not safely planted on the floor. Stopped pt before he reached too far and demonstrated how that position put him at high risk for falls. Pt repositioned foot and then was able to successfully reach for shoes. Ambulated to sink to complete all grooming. Maintained balance by leaning hips against the sink.  Pt sat back to EOB to wait for his next therapy session.    Therapy Documentation Precautions:  Precautions Precautions: Fall Precaution Comments: dizzy, poor postural control Restrictions Weight Bearing Restrictions: No    Vital Signs:  Therapy Vitals Temp: 97.6 F (36.4 C) Temp Source: Oral Pulse Rate: (!) 46 Resp: 18 BP: (!) 121/58 Patient Position (if appropriate): Lying Oxygen Therapy SpO2: 97 % O2 Device: CPAP Pain: Pain Assessment Pain Assessment: No/denies pain ADL: ADL ADL Comments: Please see functional navigator  See Function Navigator for Current Functional Status.   Therapy/Group: Individual Therapy  [place] 07/10/2016, 8:57 AM

## 2016-07-10 NOTE — Progress Notes (Signed)
Physical Therapy Session Note  Patient Details  Name: [name] MRN: [id number] Date of Birth: Jan 04, 1946  Today's Date: 07/10/2016 PT Individual Time: 1015-1100 PT Individual Time Calculation (min): 45 min   Short Term Goals: Week 3:  PT Short Term Goal 1 (Week 3): =LTGs due ELOS  Skilled Therapeutic Interventions/Progress Updates: Pt received supine in bed, denies pain and agreeable to treatment. Supine>sit with modI. Gait to gym with RW and min guard/close S x150'. Progressive balance exercises in parallel bars including normal BOS, narrow BOS, staggered stance, level/unlevel surface, eyes open/closed; each position held 30 sec-1 min with min guard and occasional use of UEs to regain LOB, however improved with repetition. Rocker board oriented medial/lateral for focus on weight shifting; cued pt in far R weight shifting until R hip is on bar and then attempted to maintain that position. Also performed dynamic weight shifts far R to far L without use of UEs to regain. Returned to room in w/c totalA for energy conservation. Stand pivot transfer with bedrails and S; remained seated on EOB at end of session, all needs in reach.      Therapy Documentation Precautions:  Precautions Precautions: Fall Precaution Comments: dizzy, poor postural control Restrictions Weight Bearing Restrictions: No Pain: Pain Assessment Pain Assessment: No/denies pain   See Function Navigator for Current Functional Status.   Therapy/Group: Individual Therapy  Luberta Mutter 07/10/2016, 10:55 AM

## 2016-07-10 NOTE — Progress Notes (Signed)
Speech Language Pathology Daily Session Note  Patient Details  Name: [name] MRN: [id number] Date of Birth: July 29, 1945  Today's Date: 07/10/2016 SLP Individual Time: 3435-6861 SLP Individual Time Calculation (min): 25 min  Short Term Goals: Week 3: SLP Short Term Goal 1 (Week 3): Patient will recall and utilize left head turn and tuck with current diet with minimal overt s/s of aspiration with Mod I.   Skilled Therapeutic Interventions: Skilled treatment session focused on dysphagia goals. Patient consumed thin liquids via cup and medications whole in puree without overt s/s of aspiration and was Mod I for use of swallowing compensatory strategies. Recommend patient continue current diet. Patient left sitting EOB with all needs within reach. Continue with current plan of care.      Function:  Eating Eating   Modified Consistency Diet: No Eating Assist Level: Swallowing techniques: self managed           Cognition Comprehension Comprehension assist level: Understands complex 90% of the time/cues 10% of the time  Expression   Expression assist level: Expresses complex 90% of the time/cues < 10% of the time  Social Interaction Social Interaction assist level: Interacts appropriately 90% of the time - Needs monitoring or encouragement for participation or interaction.  Problem Solving Problem solving assist level: Solves basic 90% of the time/requires cueing < 10% of the time  Memory Memory assist level: Recognizes or recalls 90% of the time/requires cueing < 10% of the time    Pain No/Denies Pain   Therapy/Group: Individual Therapy  Abelino Tippin 07/10/2016, 3:36 PM

## 2016-07-10 NOTE — Progress Notes (Signed)
Subjective/Complaints: Discussed lab result with pt, was taking KCL supplement at home up to 3 times a day but then was D/Ced by PCP  ROS: Denies CP, SOB, N/V/D.  Objective: Vital Signs: Blood pressure (!) 121/58, pulse (!) 46, temperature 97.6 F (36.4 C), temperature source Oral, resp. rate 18, height 6' (1.829 m), weight (!) 155.2 kg (342 lb 2.5 oz), SpO2 97 %. No results found. Results for orders placed or performed during the hospital encounter of 06/21/16 (from the past 72 hour(s))  Basic metabolic panel     Status: Abnormal   Collection Time: 07/10/16  6:21 AM  Result Value Ref Range   Sodium 141 135 - 145 mmol/L   Potassium 3.1 (L) 3.5 - 5.1 mmol/L   Chloride 105 101 - 111 mmol/L   CO2 26 22 - 32 mmol/L   Glucose, Bld 118 (H) 65 - 99 mg/dL   BUN 22 (H) 6 - 20 mg/dL   Creatinine, Ser 1.06 0.61 - 1.24 mg/dL   Calcium 8.3 (L) 8.9 - 10.3 mg/dL   GFR calc non Af Amer >60 >60 mL/min   GFR calc Af Amer >60 >60 mL/min    Comment: (NOTE) The eGFR has been calculated using the CKD EPI equation. This calculation has not been validated in all clinical situations. eGFR's persistently <60 mL/min signify possible Chronic Kidney Disease.    Anion gap 10 5 - 15  CBC with Differential/Platelet     Status: None   Collection Time: 07/10/16  6:21 AM  Result Value Ref Range   WBC 8.7 4.0 - 10.5 K/uL   RBC 4.57 4.22 - 5.81 MIL/uL   Hemoglobin 13.2 13.0 - 17.0 g/dL   HCT 41.0 39.0 - 52.0 %   MCV 89.7 78.0 - 100.0 fL   MCH 28.9 26.0 - 34.0 pg   MCHC 32.2 30.0 - 36.0 g/dL   RDW 14.4 11.5 - 15.5 %   Platelets 196 150 - 400 K/uL   Neutrophils Relative % 67 %   Neutro Abs 5.8 1.7 - 7.7 K/uL   Lymphocytes Relative 22 %   Lymphs Abs 1.9 0.7 - 4.0 K/uL   Monocytes Relative 8 %   Monocytes Absolute 0.7 0.1 - 1.0 K/uL   Eosinophils Relative 3 %   Eosinophils Absolute 0.2 0.0 - 0.7 K/uL   Basophils Relative 0 %   Basophils Absolute 0.0 0.0 - 0.1 K/uL     HEENT: AT, [place] Cardio: RRR. No  JVD. Resp: CTA B/L. Unlabored.  GI: ND, BS+ Skin:   Intact. Warm and dry. Neuro: Alert/Oriented Motor: 5/5 BUE and 4+/5 BLE No ataxia Musc/Skel:  No edema. No tenderness Gen NAD, obese   Assessment/Plan: 1. 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 Physiatrist is providing close team supervision and 24 hour management of active medical problems listed below. Physiatrist and rehab team continue to assess barriers to discharge/monitor patient progress toward functional and medical goals. FIM: Function - Bathing Position: Shower Body parts bathed by patient: Chest, Left arm, Right arm, Abdomen, Front perineal area, Right upper leg, Left upper leg, Right lower leg, Left lower leg Body parts bathed by helper: Buttocks Assist Level: Touching or steadying assistance(Pt > 75%) Assistive Device Comment: Long handled sponge  Function- Upper Body Dressing/Undressing What is the patient wearing?: Pull over shirt/dress Pull over shirt/dress - Perfomed by patient: Thread/unthread right sleeve, Thread/unthread left sleeve, Pull shirt over trunk,  Put head through opening Pull over shirt/dress - Perfomed by helper: Pull shirt over trunk Assist Level: Set up Assistive Device Comment: long handled sponge Set up : To obtain clothing/put away Function - Lower Body Dressing/Undressing What is the patient wearing?: Pants, Underwear, Socks, Shoes Position: Sitting EOB Underwear - Performed by patient: Thread/unthread right underwear leg, Thread/unthread left underwear leg, Pull underwear up/down Underwear - Performed by helper: Pull underwear up/down Pants- Performed by patient: Thread/unthread right pants leg, Thread/unthread left pants leg, Pull pants up/down Pants- Performed by helper: Pull pants up/down Non-skid slipper socks- Performed by patient: Don/doff right sock, Don/doff left  sock Non-skid slipper socks- Performed by helper: Don/doff right sock, Don/doff left sock Socks - Performed by patient: Don/doff right sock, Don/doff left sock Shoes - Performed by patient: Don/doff right shoe, Don/doff left shoe, Fasten right, Fasten left Shoes - Performed by helper: Don/doff right shoe, Don/doff left shoe, Fasten right, Fasten left Assist for footwear: Supervision/touching assist Assist for lower body dressing: Touching or steadying assistance (Pt > 75%) Assistive Device Comment: reacher  Function - Toileting Toileting activity did not occur: No continent bowel/bladder event Toileting steps completed by patient: Adjust clothing prior to toileting, Performs perineal hygiene, Adjust clothing after toileting Toileting steps completed by helper: Performs perineal hygiene Toileting Assistive Devices: Grab bar or rail Assist level: Touching or steadying assistance (Pt.75%)  Function - Air cabin crew transfer activity did not occur: Safety/medical concerns Toilet transfer assistive device: Elevated toilet seat/BSC over toilet Assist level to toilet: Touching or steadying assistance (Pt > 75%) Assist level from toilet: Touching or steadying assistance (Pt > 75%)  Function - Chair/bed transfer Chair/bed transfer method: Squat pivot Chair/bed transfer assist level: Supervision or verbal cues Chair/bed transfer assistive device: Armrests Chair/bed transfer details: Verbal cues for precautions/safety  Function - Locomotion: Wheelchair Will patient use wheelchair at discharge?: No Type: Manual Max wheelchair distance: 150 ft Assist Level: Supervision or verbal cues Assist Level: Supervision or verbal cues Assist Level: Supervision or verbal cues Turns around,maneuvers to table,bed, and toilet,negotiates 3% grade,maneuvers on rugs and over doorsills: Yes Function - Locomotion: Ambulation Assistive device: Walker-rolling Max distance: 150 ft Assist level: Touching  or steadying assistance (Pt > 75%) Assist level: Touching or steadying assistance (Pt > 75%) Walk 50 feet with 2 turns activity did not occur: Safety/medical concerns Assist level: Touching or steadying assistance (Pt > 75%) Walk 150 feet activity did not occur: Safety/medical concerns Assist level: Touching or steadying assistance (Pt > 75%) Walk 10 feet on uneven surfaces activity did not occur: Safety/medical concerns  Function - Comprehension Comprehension: Auditory Comprehension assistive device: Hearing aids Comprehension assist level: Understands complex 90% of the time/cues 10% of the time  Function - Expression Expression: Verbal Expression assist level: Expresses complex 90% of the time/cues < 10% of the time  Function - Social Interaction Social Interaction assist level: Interacts appropriately 90% of the time - Needs monitoring or encouragement for participation or interaction.  Function - Problem Solving Problem solving assist level: Solves basic 90% of the time/requires cueing < 10% of the time  Function - Memory Memory assist level: Recognizes or recalls 90% of the time/requires cueing < 10% of the time Patient normally able to recall (first 3 days only): Current season, Location of own room, Staff names and faces, That he or she is in a hospital  Medical Problem List and Plan: 1.  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  DVT Prophylaxis/Anticoagulation: Eliquis 3. Pain Management: Tylenol as needed, consider cont prn Tramadol 4. Mood: Risperdal 0.5 mg daily at bedtime, Zoloft 150 mg daily--home meds 5. Neuropsych: This patient is capable of making decisions on his own behalf. 6. Skin/Wound Care: Routine skin checks 7. Fluids/Electrolytes/Nutrition: Routine I&Os 8. Dysphagia. Advanced to regular diet. Follow-up speech therapy.  Monitor for any signs of aspiration 9. Atrial fibrillation with history of pulmonary emboli. Tambocor 50 mg every 12 hours. Cardiac rate controlled. 10. OSA/CPAP. Check oxygen saturations every shift 11. Diastolic congestive heart failure. Monitor for any signs of fluid overload. Lasix 40 mg daily Filed Weights   07/08/16 0524 07/09/16 0513 07/10/16 0510  Weight: (!) 157 kg (346 lb 2 oz) (!) 154.2 kg (340 lb) (!) 155.2 kg (342 lb 2.5 oz)  12. Hypertension: Monitor BP, cont Lasix  Controlled 4/8 Vitals:   07/09/16 1431 07/10/16 0510  BP: (!) 109/55 (!) 121/58  Pulse: (!) 58 (!) 46  Resp: 17 18  Temp: 98 F (36.7 C) 97.6 F (36.4 C)   13. Morbid obesity. BMI 48.56. Dietary follow-up  14. BPH. Proscar 5 mg daily.no retention   15. Hyperlipidemia. Lipitor 16. History of bilateral TKA. Patient used a Programmer, multimedia prior to admission 17.  Constipation-uses dulcolax at home  -senokot -s bid  -sorbitol prn 18.  Hx nasal congestion takes loratadine  19.  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 Leukocytosis  resolved 22. Hypokalemia  Persistent increase KCL to 80mq 23. Bun/Cr normal   LOS (Days) 19 A FACE TO FACE EVALUATION WAS PERFORMED  Staci Carver E 07/10/2016, 7:29 AM

## 2016-07-11 ENCOUNTER — Inpatient Hospital Stay (HOSPITAL_COMMUNITY): Payer: Self-pay | Admitting: Physical Therapy

## 2016-07-11 ENCOUNTER — Inpatient Hospital Stay (HOSPITAL_COMMUNITY): Payer: Medicare Other | Admitting: Occupational Therapy

## 2016-07-11 NOTE — Discharge Summary (Signed)
Discharge summary job [id number]

## 2016-07-11 NOTE — Progress Notes (Signed)
Physical Therapy Session Note  Patient Details  Name: [name] MRN: [id number] Date of Birth: 1945/12/10  Today's Date: 07/11/2016 PT Individual Time: 1453-1535 PT Individual Time Calculation (min): 42 min   Short Term Goals: Week 3:  PT Short Term Goal 1 (Week 3): =LTGs due ELOS  Skilled Therapeutic Interventions/Progress Updates: Pt presented in bed asleep easily aroused. Performed supine to sit with supervision. Transferred to w/c with RW and mod I. Pt participated in w/c mobility throughout hospital on level surfaces and outside on patio with low up/down grades. Pt able to propel distances up to 313f before requiring breaks. Pt able to appropriately maneuver in/out elevators and maneuver in crowded spaces with good safety. Pt returned to room and remained in w/c with call bell within reach and needs met.      Therapy Documentation Precautions:  Precautions Precautions: Fall Precaution Comments: decreased postural control Restrictions Weight Bearing Restrictions: No General:   Vital Signs: Therapy Vitals Temp: 98.6 F (37 C) Temp Source: Oral Pulse Rate: (!) 54 Resp: 17 BP: 127/63 Patient Position (if appropriate): Lying Oxygen Therapy SpO2: 99 % O2 Device: Not Delivered Pain: Pain Assessment Pain Assessment: No/denies pain   See Function Navigator for Current Functional Status.   Therapy/Group: Individual Therapy  Havilah Topor  Nicky Kras, PTA  07/11/2016, 3:57 PM

## 2016-07-11 NOTE — Discharge Summary (Signed)
[name], [name]             ACCOUNT NO.:  [id number]  MEDICAL RECORD NO.:  [id number]  LOCATION:  4M03C                        FACILITY:  [facility]  PHYSICIAN:  Charlett Blake, M.D.DATE OF BIRTH:  1945-10-15  DATE OF ADMISSION:  06/21/2016 DATE OF DISCHARGE:  07/12/2016                              DISCHARGE SUMMARY   DISCHARGE DIAGNOSES: 1. Left medullary infarct. 2. Eliquis for deep vein thrombosis prophylaxis. 3. Pain management. 4. Anxiety. 5. Dysphagia. 6. Atrial fibrillation with history of pulmonary emboli. 7. Obstructive sleep apnea. 8. Diastolic congestive heart failure. 9. Morbid obesity. 10.Benign prostatic hyperplasia. 11.Hyperlipidemia. 12.History of bilateral total knee arthroplasty. 13.Constipation. 14.Left vocal cord paralysis, cranial nerves IX and X palsy. 15.Hypokalemia, resolved.  HISTORY OF PRESENT ILLNESS:  This is a 71 year old right-handed male with history of atrial fibrillation, pulmonary emboli, maintained on Eliquis, as well as history of hypertension, obstructive sleep apnea, diastolic congestive heart failure.  He lives with spouse independent with a cane and a walker prior to admission.  One-level home.  Presented June 19, 2016, with headache, sudden onset while watching television leaning to the left side as well as slurred speech.  Cranial CT scan negative.  He did not receive tPA.  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  No proximal occlusion.  No significant stenosis or dissection of carotid arteries.  MRI showed a small left medullary infarct as well as LV4 occlusion.  Interventional Radiology consulted, no need for emergent endovascular treatment.  Echocardiogram with ejection fraction 68%, grade 1 diastolic dysfunction.  Swallow study completed, mechanical soft diet.  The patient remained on Eliquis as well as low-dose aspirin for CVA prophylaxis in light of history of  atrial fibrillation.  Physical and occupational therapy ongoing.  The patient was admitted for a comprehensive rehab program.  PAST MEDICAL HISTORY:  See discharge diagnoses.  SOCIAL HISTORY:  Lives with spouse, independent with a cane, walker prior to admission due to knee pain.  Functional status upon admission to rehab services was +2 physical assist to ambulate 3 steps, moderate assist sit to stand, minimal assist supine to sit, mod max assist activities of daily living.  PHYSICAL EXAMINATION:  VITAL SIGNS:  Blood pressure 127/73, pulse 55, temperature 97, respirations 20. GENERAL:  This was an alert male, oriented to person, place, and time. HEENT:  EOMs intact.  Pupils round and reactive to light. LUNGS:  Clear to auscultation without wheeze. CARDIAC:  Regular rate and rhythm.  No murmur. ABDOMEN:  Soft, nontender.  Good bowel sounds. NEUROLOGIC:  Speech was dysarthric, but intelligible.  REHABILITATION HOSPITAL COURSE:  The patient was admitted to Inpatient Rehab Services with therapies initiated on a 3-hour daily basis, consisting of physical therapy, occupational therapy, speech therapy, and rehabilitation nursing.  The following issues were addressed during the patient's rehabilitation stay.  Pertaining to Mr. [place] left medullary infarct, he remained on Eliquis as well as low-dose aspirin. He would follow up Neurology Services.  Pain management with the use of Ultram as needed.  His diet was currently advanced to regular.  Speech therapy followup, question left vocal cord paralysis, cranial nerves IX and X.  Anticipation  was for followup outpatient with ENT if not improving in the next few months.  Blood pressure and heart rate remained well controlled.  No orthostatic changes.  He exhibited no other signs of fluid overload.  He remained on Lasix therapy.  He did have a history of BPH, close monitoring of postvoid residuals.  Denied any dysuria.  He continued on  Proscar.  Bouts of constipation resolved with laxative assistance.  He did have some mild hypokalemia resolved with potassium supplement.  History of anxiety, doing well with the use of Risperdal as well as Zoloft.  The patient received weekly collaborative interdisciplinary team conferences to discuss estimated length of stay, family teaching, any barriers to discharge.  Ambulates to the rehab gymnasium with a rolling walker, minimal assist to close supervision to 150 feet, working with energy conservation techniques. Perform dynamic weight shifts right to left without the use of upper extremities to regain his balance, stand pivot transfers with bed rails and supervision.  He could gather his belongings for activities of daily living and homemaking sitting at the edge of the bed, ambulate to the bathroom with assistive device.  He uses lateral leans to wash his backside versus stand to sit for safety.  Full family teaching was completed and plan was for discharge to home.  He will continue on a regular diet with swallowing precautions as indicated per speech therapy.  DISCHARGE MEDICATIONS: 1. Eliquis 5 mg p.o. b.i.d. 2. Aspirin 81 mg p.o. b.i.d. 3. Lipitor 80 mg p.o. daily. 4. Dulcolax tablet 5 mg every other day. 5. Proscar 5 mg p.o. daily. 6. Tambocor 50 mg p.o. every 12 hours. 7. Lasix 40 mg p.o. daily. 8. Claritin 10 mg p.o. daily. 9. Dulera 2 puffs twice daily. 10.Protonix 40 mg p.o. daily. 11.Potassium chloride 40 mEq p.o. daily. 12.Risperdal 0.5 mg p.o. at bedtime. 13.Requip 0.5 mg p.o. at bedtime. 14.Zoloft 150 mg p.o. daily. 15.Ultram 50 mg p.o. every 8 hours as needed pain.  DIET:  His diet was regular with thin liquids.  FOLLOWUP:  The patient would follow up with Dr. Alysia Penna at the outpatient rehab center as directed; Dr. Antony Contras, call for appointment; Dr. [name], medical management.     Lauraine Rinne,  P.A.   ______________________________ Charlett Blake, M.D.    DA/MEDQ  D:  07/11/2016  T:  07/11/2016  Job:  [id number]  cc:   Charlett Blake, M.D. Pramod P. Leonie Man, MD [name], MD

## 2016-07-11 NOTE — Progress Notes (Signed)
Subjective/Complaints: Patient looking forward to discharge in a.m.  ROS: Denies CP, SOB, N/V/D.  Objective: Vital Signs: Blood pressure 137/61, pulse (!) 55, temperature 97.7 F (36.5 C), temperature source Oral, resp. rate 16, height 6' (1.829 m), weight (!) 156 kg (343 lb 14.7 oz), SpO2 99 %. No results found. Results for orders placed or performed during the hospital encounter of 06/21/16 (from the past 72 hour(s))  Basic metabolic panel     Status: Abnormal   Collection Time: 07/10/16  6:21 AM  Result Value Ref Range   Sodium 141 135 - 145 mmol/L   Potassium 3.1 (L) 3.5 - 5.1 mmol/L   Chloride 105 101 - 111 mmol/L   CO2 26 22 - 32 mmol/L   Glucose, Bld 118 (H) 65 - 99 mg/dL   BUN 22 (H) 6 - 20 mg/dL   Creatinine, Ser 1.06 0.61 - 1.24 mg/dL   Calcium 8.3 (L) 8.9 - 10.3 mg/dL   GFR calc non Af Amer >60 >60 mL/min   GFR calc Af Amer >60 >60 mL/min    Comment: (NOTE) The eGFR has been calculated using the CKD EPI equation. This calculation has not been validated in all clinical situations. eGFR's persistently <60 mL/min signify possible Chronic Kidney Disease.    Anion gap 10 5 - 15  CBC with Differential/Platelet     Status: None   Collection Time: 07/10/16  6:21 AM  Result Value Ref Range   WBC 8.7 4.0 - 10.5 K/uL   RBC 4.57 4.22 - 5.81 MIL/uL   Hemoglobin 13.2 13.0 - 17.0 g/dL   HCT 41.0 39.0 - 52.0 %   MCV 89.7 78.0 - 100.0 fL   MCH 28.9 26.0 - 34.0 pg   MCHC 32.2 30.0 - 36.0 g/dL   RDW 14.4 11.5 - 15.5 %   Platelets 196 150 - 400 K/uL   Neutrophils Relative % 67 %   Neutro Abs 5.8 1.7 - 7.7 K/uL   Lymphocytes Relative 22 %   Lymphs Abs 1.9 0.7 - 4.0 K/uL   Monocytes Relative 8 %   Monocytes Absolute 0.7 0.1 - 1.0 K/uL   Eosinophils Relative 3 %   Eosinophils Absolute 0.2 0.0 - 0.7 K/uL   Basophils Relative 0 %   Basophils Absolute 0.0 0.0 - 0.1 K/uL     HEENT: AT, [place] Cardio: RRR. No JVD. Resp: CTA B/L. Unlabored.  GI: ND, BS+ Skin:   Intact. Warm and  dry. Neuro: Alert/Oriented Motor: 5/5 BUE and 4+/5 BLE No ataxia Musc/Skel:  No edema. No tenderness Gen NAD, obese   Assessment/Plan: 1. 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 Physiatrist is providing close team supervision and 24 hour management of active medical problems listed below. Physiatrist and rehab team continue to assess barriers to discharge/monitor patient progress toward functional and medical goals. FIM: Function - Bathing Position: Shower Body parts bathed by patient: Chest, Left arm, Right arm, Abdomen, Front perineal area, Right upper leg, Left upper leg, Right lower leg, Left lower leg, Buttocks, Back Body parts bathed by helper: Buttocks Assist Level: Set up Assistive Device Comment: Long handled sponge  Function- Upper Body Dressing/Undressing What is the patient wearing?: Pull over shirt/dress Pull over shirt/dress - Perfomed by patient: Thread/unthread right sleeve, Thread/unthread left sleeve, Pull shirt over trunk, Put head through opening Pull over shirt/dress - Perfomed by helper: Pull shirt over trunk Assist Level: Set up  Assistive Device Comment: long handled sponge Set up : To obtain clothing/put away Function - Lower Body Dressing/Undressing What is the patient wearing?: Pants, Underwear, Socks, Shoes Position: Sitting EOB Underwear - Performed by patient: Thread/unthread right underwear leg, Thread/unthread left underwear leg, Pull underwear up/down Underwear - Performed by helper: Pull underwear up/down Pants- Performed by patient: Thread/unthread right pants leg, Thread/unthread left pants leg, Pull pants up/down Pants- Performed by helper: Pull pants up/down Non-skid slipper socks- Performed by patient: Don/doff right sock, Don/doff left sock Non-skid slipper socks- Performed by helper: Don/doff right sock, Don/doff left  sock Socks - Performed by patient: Don/doff right sock, Don/doff left sock Shoes - Performed by patient: Don/doff right shoe, Don/doff left shoe, Fasten right, Fasten left Shoes - Performed by helper: Don/doff right shoe, Don/doff left shoe, Fasten right, Fasten left Assist for footwear: Supervision/touching assist Assist for lower body dressing: Set up Assistive Device Comment: reacher  Function - Toileting Toileting activity did not occur: No continent bowel/bladder event Toileting steps completed by patient: Adjust clothing prior to toileting, Performs perineal hygiene, Adjust clothing after toileting Toileting steps completed by helper: Performs perineal hygiene Toileting Assistive Devices: Grab bar or rail Assist level: Touching or steadying assistance (Pt.75%)  Function - Air cabin crew transfer activity did not occur: Safety/medical concerns Toilet transfer assistive device: Elevated toilet seat/BSC over toilet Assist level to toilet: Touching or steadying assistance (Pt > 75%) Assist level from toilet: Touching or steadying assistance (Pt > 75%)  Function - Chair/bed transfer Chair/bed transfer method: Squat pivot Chair/bed transfer assist level: Supervision or verbal cues Chair/bed transfer assistive device: Armrests Chair/bed transfer details: Verbal cues for precautions/safety  Function - Locomotion: Wheelchair Will patient use wheelchair at discharge?: No Type: Manual Max wheelchair distance: 150 ft Assist Level: Supervision or verbal cues Assist Level: Supervision or verbal cues Assist Level: Supervision or verbal cues Turns around,maneuvers to table,bed, and toilet,negotiates 3% grade,maneuvers on rugs and over doorsills: Yes Function - Locomotion: Ambulation Assistive device: Walker-rolling Max distance: 150 Assist level: Touching or steadying assistance (Pt > 75%) Assist level: Touching or steadying assistance (Pt > 75%) Walk 50 feet with 2 turns  activity did not occur: Safety/medical concerns Assist level: Touching or steadying assistance (Pt > 75%) Walk 150 feet activity did not occur: Safety/medical concerns Assist level: Touching or steadying assistance (Pt > 75%) Walk 10 feet on uneven surfaces activity did not occur: Safety/medical concerns  Function - Comprehension Comprehension: Auditory Comprehension assistive device: Hearing aids Comprehension assist level: Understands complex 90% of the time/cues 10% of the time  Function - Expression Expression: Verbal Expression assist level: Expresses complex 90% of the time/cues < 10% of the time  Function - Social Interaction Social Interaction assist level: Interacts appropriately 90% of the time - Needs monitoring or encouragement for participation or interaction.  Function - Problem Solving Problem solving assist level: Solves basic 90% of the time/requires cueing < 10% of the time  Function - Memory Memory assist level: Recognizes or recalls 90% of the time/requires cueing < 10% of the time Patient normally able to recall (first 3 days only): Current season, Location of own room, Staff names and faces, That he or she is in a hospital  Medical Problem List and Plan: 1.  Ataxia, headache secondary to left medullary infarct in the setting of L V4 occlusion, infarct felt to be embolic secondary to known atrial fibrillation   Cont CIR PT, OT, SLP, planned discharge in a.m.  2.  DVT Prophylaxis/Anticoagulation: Eliquis  3. Pain Management: Tylenol as needed, consider cont prn Tramadol 4. Mood: Risperdal 0.5 mg daily at bedtime, Zoloft 150 mg daily--home meds 5. Neuropsych: This patient is capable of making decisions on his own behalf. 6. Skin/Wound Care: Routine skin checks 7. Fluids/Electrolytes/Nutrition: Routine I&Os 8. Dysphagia. Advanced to regular diet. Follow-up speech therapy. Monitor for any signs of aspiration 9. Atrial fibrillation with history of pulmonary emboli.  Tambocor 50 mg every 12 hours. Cardiac rate controlled. 10. OSA/CPAP. Check oxygen saturations every shift 11. Diastolic congestive heart failure. Monitor for any signs of fluid overload. Lasix 40 mg daily Filed Weights   07/09/16 0513 07/10/16 0510 07/11/16 0530  Weight: (!) 154.2 kg (340 lb) (!) 155.2 kg (342 lb 2.5 oz) (!) 156 kg (343 lb 14.7 oz)  12. Hypertension: Monitor BP, cont Lasix  Controlled 4/10 Vitals:   07/10/16 1430 07/11/16 0530  BP: (!) 111/57 137/61  Pulse: 65 (!) 55  Resp: 18 16  Temp: 98.8 F (37.1 C) 97.7 F (36.5 C)   13. Morbid obesity. BMI 48.56. Dietary follow-up  14. BPH. Proscar 5 mg daily.no retention   15. Hyperlipidemia. Lipitor 16. History of bilateral TKA. Patient used a Programmer, multimedia prior to admission 17.  Constipation-uses dulcolax at home  -senokot -s bid  -sorbitol prn 18.  Hx nasal congestion takes loratadine  19.  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 Hypokalemia  Persistent increase KCL to 27mq- recheck BMET in am    LOS (Days) 20 A FACE TO FACE EVALUATION WAS PERFORMED  KIRSTEINS,ANDREW E 07/11/2016, 7:26 AM

## 2016-07-11 NOTE — Progress Notes (Signed)
Physical Therapy Discharge Summary  Patient Details  Name: [name] MRN: [id number] Date of Birth: 04/06/1945  Today's Date: 07/11/2016 PT Individual Time: 1015-1119 and 1550-1623 PT Individual Time Calculation (min): 64 min and 23 min    Patient has met 10 of 10 long term goals due to improved balance, improved postural control, ability to compensate for deficits, functional use of  left upper extremity and left lower extremity, improved awareness and improved coordination.  Patient to discharge at mod I w/c, supervision ambulatory level  .   Patient's care partner is independent to provide the necessary physical assistance at discharge.   Recommendation:  Patient will benefit from ongoing skilled PT services in outpatient setting to continue to advance safe functional mobility, address ongoing impairments in coordination, balance, proprioception, and strength, and minimize fall risk.  Equipment: 22x18 w/c with tension back and ELRs, heavy duty RW  Reasons for discharge: treatment goals met  Patient/family agrees with progress made and goals achieved: Yes   Skilled PT Intervention: Session 1: no c/o pain.  Session focus on family education with pt's wife, Sharon, and initial discharge assessment.  Pt currently performing all mobility with supervision or better.  Pt requires cues for self correction of balance/midline orientation in less than 25% of opportunities.  See below for mobility details.  PT instructed Sharon in providing supervision/steady assist for stair negotiation with L rail, car transfers, and ambulation.  Also discussed home entry and answered all questions regarding discharge, f/u, and DME within scope.  Pt returned to room at end of session, Sharon signed off for in room mobility, and pt made mod I for bed<>w/c transfers.  Call bell in reach and needs met.   Session 2: no c/o pain.  Session focus on activity tolerance and gait training.  Pt negotiates 12 steps x2  with 2 handrails and supervision.  Rest break between trials.  Gait training on compliant surface x10 feet with close supervision.  nustep x10 minutes at level 4 for activity tolerance and reciprocal stepping pattern retraining.  Pt ambulates back to room at end of session and positioned seated EOB with call bell in reach and needs met.   PT Discharge Precautions/Restrictions   Vital Signs Oxygen Therapy SpO2: 96 % O2 Device: Not Delivered Pain Pain Assessment Pain Assessment: No/denies pain Vision/Perception  Vision - Assessment Eye Alignment: Within Functional Limits Ocular Range of Motion: Within Functional Limits (diplopia when looking up) Alignment/Gaze Preference: Within Defined Limits Tracking/Visual Pursuits: Able to track stimulus in all quads without difficulty Convergence: Within functional limits Diplopia Assessment: Other (comment) (present with gaze upwards) Perception Comments: WFL  Cognition Overall Cognitive Status: Within Functional Limits for tasks assessed Arousal/Alertness: Awake/alert Orientation Level: Oriented X4 Sustained Attention: Appears intact Memory: Appears intact Awareness: Appears intact Sensation Sensation Light Touch: Appears Intact (LEs) Proprioception: Impaired by gross assessment (some deficits ?whether due to premorbid neuropathy or proprioception) Additional Comments: (-) thumb find test bilat Coordination Gross Motor Movements are Fluid and Coordinated: No Fine Motor Movements are Fluid and Coordinated: Yes Finger Nose Finger Test: WFL R=L Heel Shin Test: WFL on R, initial ataxia on L but improved once heel in contact with shin Motor  Motor Motor: Ataxia Motor - Discharge Observations: ataxia improving significantly since time of eval, worse with fatigue  Mobility Transfers Transfers: Yes Sit to Stand: 6: Modified independent (Device/Increase time) Stand to Sit: 6: Modified independent (Device/Increase time) Stand Pivot  Transfers: 6: Modified independent (Device/Increase time) Squat Pivot Transfers: 6: Modified   independent (Device/Increase time) Locomotion  Ambulation Ambulation: Yes Ambulation/Gait Assistance: 5: Supervision Ambulation Distance (Feet): 150 Feet Assistive device: Rolling walker Gait Gait: Yes Gait Pattern: Impaired Gait Pattern: Step-through pattern;Decreased weight shift to left Gait velocity: improving speed Stairs / Additional Locomotion Stairs: Yes Stairs Assistance: 4: Min guard Stair Management Technique: One rail Left Number of Stairs: 8 Height of Stairs: 6 Wheelchair Mobility Wheelchair Mobility: Yes Wheelchair Assistance: 6: Modified independent (Device/Increase time) Environmental health practitioner: Both upper extremities Wheelchair Parts Management: Independent Distance: 150  Trunk/Postural Assessment  Cervical Assessment Cervical Assessment: Within Functional Limits Thoracic Assessment Thoracic Assessment: Within Functional Limits Lumbar Assessment Lumbar Assessment: Within Functional Limits Postural Control Postural Control: Within Functional Limits Righting Reactions: normal Protective Responses: normal Postural Limitations: remain decreased, however pt with improved awareness and improved ability to manage  Balance Balance Balance Assessed: Yes Static Standing Balance Static Standing - Balance Support: No upper extremity supported Static Standing - Level of Assistance: 5: Stand by assistance Dynamic Standing Balance Dynamic Standing - Balance Support: Right upper extremity supported;Left upper extremity supported;During functional activity Dynamic Standing - Level of Assistance: 5: Stand by assistance Extremity Assessment      RLE Assessment RLE Assessment: Within Functional Limits (5/5 throughout) LLE Assessment LLE Assessment: Within Functional Limits LLE Strength Left Hip Flexion: 4+/5 Left Knee Flexion: 5/5 Left Knee Extension: 4+/5 Left Ankle  Dorsiflexion: 5/5 Left Ankle Plantar Flexion: 5/5   See Function Navigator for Current Functional Status.  Dorette Hartel E Penven-Crew 07/11/2016, 11:10 AM

## 2016-07-11 NOTE — Progress Notes (Signed)
Occupational Therapy Discharge Summary  Patient Details  Name: [name] MRN: [id number] Date of Birth: [birth year]    Patient has met 10 of 10 long term goals due to improved activity tolerance, improved balance, postural control, ability to compensate for deficits and improved coordination.  Patient to discharge at overall Supervision level.  Patient's care partner is independent to provide the necessary physical assistance at discharge.    Reasons goals not met: n/a  Recommendation:  Patient will benefit from ongoing skilled OT services in outpatient setting to continue to advance functional skills in the area of BADL and iADL.  Equipment: transfer tub bench, drop arm BSC  Reasons for discharge: treatment goals met  Patient/family agrees with progress made and goals achieved: Yes  OT Discharge Precautions/Restrictions  Precautions Precautions: Fall Precaution Comments: decreased postural control Restrictions Weight Bearing Restrictions: No  ADL ADL ADL Comments: Please see functional navigator Vision/Perception  Vision- History Baseline Vision/History: Wears glasses Wears Glasses: At all times Patient Visual Report: Diplopia Vision- Assessment Vision Assessment?: Yes Eye Alignment: Within Functional Limits Ocular Range of Motion: Within Functional Limits (diplopia when looking up) Alignment/Gaze Preference: Within Defined Limits Tracking/Visual Pursuits: Able to track stimulus in all quads without difficulty Convergence: Within functional limits Visual Fields: No apparent deficits Diplopia Assessment: Other (comment) (present with gaze upwards) Perception Comments: WFL  Cognition Overall Cognitive Status: Within Functional Limits for tasks assessed Arousal/Alertness: Awake/alert Orientation Level: Oriented X4 Sustained Attention: Appears intact Memory: Appears intact Awareness: Appears intact Safety/Judgment: Appears intact Sensation Sensation Light  Touch: Appears Intact (LEs) Proprioception: Impaired by gross assessment (some deficits ?whether due to premorbid neuropathy or proprioception) Additional Comments: (-) thumb find test bilat Coordination Gross Motor Movements are Fluid and Coordinated: No Fine Motor Movements are Fluid and Coordinated: Yes Finger Nose Finger Test: [facility] R=L Heel Shin Test: WFL on R, initial ataxia on L but improved once heel in contact with shin Motor  Motor Motor: Ataxia Motor - Discharge Observations: ataxia improving significantly since time of eval, worse with fatigue Mobility  Bed Mobility Supine to Sit: 6: Modified independent (Device/Increase time) Transfers Sit to Stand: 6: Modified independent (Device/Increase time) Stand to Sit: 6: Modified independent (Device/Increase time)  Trunk/Postural Assessment  Cervical Assessment Cervical Assessment: Within Functional Limits Thoracic Assessment Thoracic Assessment: Within Functional Limits Lumbar Assessment Lumbar Assessment: Within Functional Limits Postural Control Postural Control: Within Functional Limits Righting Reactions: normal Protective Responses: normal Postural Limitations: remain decreased, however pt with improved awareness and improved ability to manage  Balance Balance Balance Assessed: Yes Static Standing Balance Static Standing - Balance Support: No upper extremity supported Static Standing - Level of Assistance: 5: Stand by assistance Dynamic Standing Balance Dynamic Standing - Balance Support: Right upper extremity supported;Left upper extremity supported;During functional activity Dynamic Standing - Level of Assistance: 5: Stand by assistance Extremity/Trunk Assessment RUE Assessment RUE Assessment: Within Functional Limits LUE Assessment LUE Assessment: Within Functional Limits   See Function Navigator for Current Functional Status.  Pinch 07/11/2016, 12:55 PM

## 2016-07-11 NOTE — Plan of Care (Signed)
Problem: RH Bed to Chair Transfers Goal: LTG Patient will perform bed/chair transfers w/assist (PT) LTG: Patient will perform bed/chair transfers with assistance, with/without cues (PT).  Outcome: Completed/Met Date Met: 07/11/16 Squat/pivot mod I, supervision for stand/pivot with RW

## 2016-07-11 NOTE — Progress Notes (Signed)
Speech Language Pathology Discharge Summary  Patient Details  Name: ELENO WEIMAR MRN: [id number] Date of Birth: 1945/06/12   Patient has met 3 of 3 long term goals.  Patient to discharge at overall Modified Independent level.   Reasons goals not met: N/A   Clinical Impression/Discharge Summary: Patient has made excellent gains and has met 3 of 3 LTG's this admission due to improved swallowing function. Currently, patient is consuming regular textures with thin liquids with minimal overt s/s of aspiration and is Mod I for use of swallowing compensatory strategies (no straws, head turn and tuck to the left). Follow-up is not warranted at this time secondary to patient needing to utilize the head turn and tuck to the left until the mobility of the left vocal cord improves which would maximize glottic closure and overall swallow safety. However, f/u will be necessary if and when his vocal cord improves to assess need for continued use of swallowing strategies. Patient education complete and patient will discharge home with family.    Care Partner:  Caregiver Able to Provide Assistance: Yes  Type of Caregiver Assistance: Physical  Recommendation:  None      Equipment: N/A   Reasons for discharge: Discharged from hospital;Treatment goals met   Patient/Family Agrees with Progress Made and Goals Achieved: Yes     Roger [place], [place] 07/11/2016, 6:50 AM

## 2016-07-11 NOTE — Progress Notes (Signed)
Occupational Therapy Session Note  Patient Details  Name: [name] MRN: [id number] Date of Birth: 1945/04/26  Today's Date: 07/11/2016 OT Individual Time: 3646-8032 OT Individual Time Calculation (min): 60 min    Short Term Goals: Week 3:  OT Short Term Goal 1 (Week 3): LTG=STG 2/2 estimated LOS  Skilled Therapeutic Interventions/Progress Updates:    Pt seen for ADL retraining with family education with his spouse. Pt taken to ADL apt as the bathroom is the same set up as the one at his home. Pt demonstrated to his spouse how he uses his RW to ambulate into the bathroom. Pt demonstrated how he bathes using the tub bench and long handled sponge. Discussed with wife equipment set up, grab bar recommendations, safety recommendations for the bathroom, energy conservation as pt continues to get short of breath during self care.  He dressed from sitting on a BSC in the bathroom. Discussed which type of BSC the pt would prefer to use at home, which is the standard drop arm.  Pt ambulated back to w/c to wait for the next therapist. Pt and spouse did not have any further questions.  Pt has reached his LTGs and is ready for discharge.  Therapy Documentation Precautions:  Precautions Precautions: Fall Precaution Comments: decreased postural control Restrictions Weight Bearing Restrictions: No      Pain: Pain Assessment Pain Assessment: No/denies pain ADL: ADL ADL Comments: Please see functional navigator  See Function Navigator for Current Functional Status.   Therapy/Group: Individual Therapy  Magdalena Skilton 07/11/2016, 12:44 PM

## 2016-07-12 LAB — BASIC METABOLIC PANEL
ANION GAP: 11 (ref 5–15)
BUN: 19 mg/dL (ref 6–20)
CHLORIDE: 105 mmol/L (ref 101–111)
CO2: 27 mmol/L (ref 22–32)
Calcium: 8.5 mg/dL — ABNORMAL LOW (ref 8.9–10.3)
Creatinine, Ser: 1.05 mg/dL (ref 0.61–1.24)
GFR calc non Af Amer: >60 mL/min (ref >60)
Glucose, Bld: 101 mg/dL — ABNORMAL HIGH (ref 65–99)
Potassium: 3.6 mmol/L (ref 3.5–5.1)
Sodium: 143 mmol/L (ref 135–145)

## 2016-07-12 MED ORDER — POTASSIUM CHLORIDE CRYS ER 20 MEQ PO TBCR: 40.0000 meq | EXTENDED_RELEASE_TABLET | Freq: Every day | ORAL | 1 refills | Status: DC

## 2016-07-12 MED ORDER — FUROSEMIDE 40 MG PO TABS: 40.0000 mg | ORAL_TABLET | Freq: Every day | ORAL | 1 refills | Status: DC

## 2016-07-12 MED ORDER — FLECAINIDE ACETATE 50 MG PO TABS: 50.0000 mg | ORAL_TABLET | Freq: Two times a day (BID) | ORAL | 2 refills | Status: DC

## 2016-07-12 MED ORDER — ROPINIROLE HCL 0.5 MG PO TABS: 0.5000 mg | ORAL_TABLET | Freq: Every day | ORAL | 1 refills | Status: DC

## 2016-07-12 MED ORDER — APIXABAN 5 MG PO TABS: 5.0000 mg | ORAL_TABLET | Freq: Two times a day (BID) | ORAL | 3 refills | Status: DC

## 2016-07-12 MED ORDER — RISPERIDONE 0.5 MG PO TABS: 0.5000 mg | ORAL_TABLET | Freq: Every day | ORAL | 2 refills | Status: DC

## 2016-07-12 MED ORDER — FINASTERIDE 5 MG PO TABS: 5.0000 mg | ORAL_TABLET | Freq: Every day | ORAL | 1 refills | Status: AC

## 2016-07-12 MED ORDER — POTASSIUM CHLORIDE CRYS ER 20 MEQ PO TBCR
40.0000 meq | EXTENDED_RELEASE_TABLET | Freq: Every day | ORAL | Status: DC
  Administered 2016-07-12: 40 meq via ORAL

## 2016-07-12 MED ORDER — ATORVASTATIN CALCIUM 80 MG PO TABS: 80.0000 mg | ORAL_TABLET | Freq: Every day | ORAL | 0 refills | Status: DC

## 2016-07-12 MED ORDER — MOMETASONE FURO-FORMOTEROL FUM 200-5 MCG/ACT IN AERO: 2.0000 | INHALATION_SPRAY | Freq: Two times a day (BID) | RESPIRATORY_TRACT | 1 refills | Status: DC

## 2016-07-12 MED ORDER — PANTOPRAZOLE SODIUM 40 MG PO TBEC: 40.0000 mg | DELAYED_RELEASE_TABLET | Freq: Every day | ORAL | 1 refills | Status: DC

## 2016-07-12 MED ORDER — SERTRALINE HCL 50 MG PO TABS: 150.0000 mg | ORAL_TABLET | Freq: Every day | ORAL | 1 refills | Status: DC

## 2016-07-12 MED ORDER — TRAMADOL HCL 50 MG PO TABS: 50.0000 mg | ORAL_TABLET | Freq: Three times a day (TID) | ORAL | 0 refills | Status: DC | PRN

## 2016-07-12 NOTE — Progress Notes (Signed)
Subjective/Complaints: No issues overnite, exceited about d/c  ROS: Denies CP, SOB, N/V/D.  Objective: Vital Signs: Blood pressure (!) 142/67, pulse (!) 55, temperature 97.9 F (36.6 C), temperature source Oral, resp. rate 16, height 6' (1.829 m), weight (!) 156.8 kg (345 lb 10.9 oz), SpO2 97 %. No results found. Results for orders placed or performed during the hospital encounter of 06/21/16 (from the past 72 hour(s))  Basic metabolic panel     Status: Abnormal   Collection Time: 07/10/16  6:21 AM  Result Value Ref Range   Sodium 141 135 - 145 mmol/L   Potassium 3.1 (L) 3.5 - 5.1 mmol/L   Chloride 105 101 - 111 mmol/L   CO2 26 22 - 32 mmol/L   Glucose, Bld 118 (H) 65 - 99 mg/dL   BUN 22 (H) 6 - 20 mg/dL   Creatinine, Ser 1.06 0.61 - 1.24 mg/dL   Calcium 8.3 (L) 8.9 - 10.3 mg/dL   GFR calc non Af Amer >60 >60 mL/min   GFR calc Af Amer >60 >60 mL/min    Comment: (NOTE) The eGFR has been calculated using the CKD EPI equation. This calculation has not been validated in all clinical situations. eGFR's persistently <60 mL/min signify possible Chronic Kidney Disease.    Anion gap 10 5 - 15  CBC with Differential/Platelet     Status: None   Collection Time: 07/10/16  6:21 AM  Result Value Ref Range   WBC 8.7 4.0 - 10.5 K/uL   RBC 4.57 4.22 - 5.81 MIL/uL   Hemoglobin 13.2 13.0 - 17.0 g/dL   HCT 41.0 39.0 - 52.0 %   MCV 89.7 78.0 - 100.0 fL   MCH 28.9 26.0 - 34.0 pg   MCHC 32.2 30.0 - 36.0 g/dL   RDW 14.4 11.5 - 15.5 %   Platelets 196 150 - 400 K/uL   Neutrophils Relative % 67 %   Neutro Abs 5.8 1.7 - 7.7 K/uL   Lymphocytes Relative 22 %   Lymphs Abs 1.9 0.7 - 4.0 K/uL   Monocytes Relative 8 %   Monocytes Absolute 0.7 0.1 - 1.0 K/uL   Eosinophils Relative 3 %   Eosinophils Absolute 0.2 0.0 - 0.7 K/uL   Basophils Relative 0 %   Basophils Absolute 0.0 0.0 - 0.1 K/uL  Basic metabolic panel     Status: Abnormal   Collection Time: 07/12/16  5:57 AM  Result Value Ref Range    Sodium 143 135 - 145 mmol/L   Potassium 3.6 3.5 - 5.1 mmol/L   Chloride 105 101 - 111 mmol/L   CO2 27 22 - 32 mmol/L   Glucose, Bld 101 (H) 65 - 99 mg/dL   BUN 19 6 - 20 mg/dL   Creatinine, Ser 1.05 0.61 - 1.24 mg/dL   Calcium 8.5 (L) 8.9 - 10.3 mg/dL   GFR calc non Af Amer >60 >60 mL/min   GFR calc Af Amer >60 >60 mL/min    Comment: (NOTE) The eGFR has been calculated using the CKD EPI equation. This calculation has not been validated in all clinical situations. eGFR's persistently <60 mL/min signify possible Chronic Kidney Disease.    Anion gap 11 5 - 15     HEENT: AT, [place] Cardio: RRR. No JVD. Resp: CTA B/L. Unlabored.  GI: ND, BS+ Skin:   Intact. Warm and dry. Neuro: Alert/Oriented Motor: 5/5 BUE and 4+/5 BLE No ataxia Musc/Skel:  No edema. No tenderness Gen NAD, obese   Assessment/Plan: 1. 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 Physiatrist is providing close team supervision and 24 hour management of active medical problems listed below. Physiatrist and rehab team continue to assess barriers to discharge/monitor patient progress toward functional and medical goals. FIM: Function - Bathing Position: Shower Body parts bathed by patient: Chest, Left arm, Right arm, Abdomen, Front perineal area, Right upper leg, Left upper leg, Right lower leg, Left lower leg, Buttocks, Back Body parts bathed by helper: Buttocks Assist Level: Set up Assistive Device Comment: Long handled sponge  Function- Upper Body Dressing/Undressing What is the patient wearing?: Pull over shirt/dress Pull over shirt/dress - Perfomed by patient: Thread/unthread right sleeve, Thread/unthread left sleeve, Pull shirt over trunk, Put head through opening Pull over shirt/dress - Perfomed by helper: Pull shirt over trunk Assist Level: More than reasonable time Assistive Device Comment:  long handled sponge Set up : To obtain clothing/put away Function - Lower Body Dressing/Undressing What is the patient wearing?: Pants, Underwear, Socks, Shoes Position: Sitting EOB Underwear - Performed by patient: Thread/unthread right underwear leg, Thread/unthread left underwear leg, Pull underwear up/down Underwear - Performed by helper: Pull underwear up/down Pants- Performed by patient: Thread/unthread right pants leg, Thread/unthread left pants leg, Pull pants up/down Pants- Performed by helper: Pull pants up/down Non-skid slipper socks- Performed by patient: Don/doff right sock, Don/doff left sock Non-skid slipper socks- Performed by helper: Don/doff right sock, Don/doff left sock Socks - Performed by patient: Don/doff right sock, Don/doff left sock Shoes - Performed by patient: Don/doff right shoe, Don/doff left shoe, Fasten right, Fasten left Shoes - Performed by helper: Don/doff right shoe, Don/doff left shoe, Fasten right, Fasten left Assist for footwear: Independent Assist for lower body dressing: More than reasonable time Assistive Device Comment: reacher  Function - Toileting Toileting activity did not occur: No continent bowel/bladder event Toileting steps completed by patient: Adjust clothing prior to toileting, Performs perineal hygiene, Adjust clothing after toileting Toileting steps completed by helper: Performs perineal hygiene Toileting Assistive Devices: Grab bar or rail Assist level: More than reasonable time  Function - Air cabin crew transfer activity did not occur: Safety/medical concerns Toilet transfer assistive device: Elevated toilet seat/BSC over toilet, Grab bar, Walker Assist level to toilet: Supervision or verbal cues Assist level from toilet: Supervision or verbal cues  Function - Chair/bed transfer Chair/bed transfer method: Squat pivot, Stand pivot Chair/bed transfer assist level: Supervision or verbal cues (mod I for squat/pivot,  supervision for stand/pivot with RW) Chair/bed transfer assistive device: Armrests, Walker Chair/bed transfer details: Verbal cues for precautions/safety  Function - Locomotion: Wheelchair Will patient use wheelchair at discharge?: Yes Type: Manual Max wheelchair distance: 300 ft Assist Level: No help, No cues, assistive device, takes more than reasonable amount of time Assist Level: No help, No cues, assistive device, takes more than reasonable amount of time Assist Level: No help, No cues, assistive device, takes more than reasonable amount of time Turns around,maneuvers to table,bed, and toilet,negotiates 3% grade,maneuvers on rugs and over doorsills: Yes Function - Locomotion: Ambulation Assistive device: Walker-rolling Max distance: 150 Assist level: Supervision or verbal cues Assist level: Supervision or verbal cues Walk 50 feet with 2 turns activity did not occur: Safety/medical concerns Assist level: Supervision or verbal cues Walk 150 feet activity did not occur: Safety/medical concerns Assist level: Supervision or verbal cues Walk 10 feet on uneven surfaces activity did not occur: Safety/medical concerns  Function - Comprehension Comprehension: Auditory  Comprehension assistive device: Hearing aids Comprehension assist level: Follows complex conversation/direction with extra time/assistive device  Function - Expression Expression: Verbal Expression assist level: Expresses complex ideas: With extra time/assistive device  Function - Social Interaction Social Interaction assist level: Interacts appropriately with others with medication or extra time (anti-anxiety, antidepressant).  Function - Problem Solving Problem solving assist level: Solves basic 90% of the time/requires cueing < 10% of the time  Function - Memory Memory assist level: Recognizes or recalls 90% of the time/requires cueing < 10% of the time Patient normally able to recall (first 3 days only): Current  season, Location of own room, Staff names and faces, That he or she is in a hospital  Medical Problem List and Plan: 1.  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  DVT Prophylaxis/Anticoagulation: Eliquis 3. Pain Management: Tylenol as needed, consider cont prn Tramadol 4. Mood: Risperdal 0.5 mg daily at bedtime, Zoloft 150 mg daily--home meds 5. Neuropsych: This patient is capable of making decisions on his own behalf. 6. Skin/Wound Care: Routine skin checks 7. Fluids/Electrolytes/Nutrition: Routine I&Os 8. Dysphagia. Advanced to regular diet. Follow-up speech therapy. Monitor for any signs of aspiration 9. Atrial fibrillation with history of pulmonary emboli. Tambocor 50 mg every 12 hours. Cardiac rate controlled. 10. OSA/CPAP. Check oxygen saturations every shift 11. Diastolic congestive heart failure. Monitor for any signs of fluid overload. Lasix 40 mg daily Filed Weights   07/10/16 0510 07/11/16 0530 07/12/16 0558  Weight: (!) 155.2 kg (342 lb 2.5 oz) (!) 156 kg (343 lb 14.7 oz) (!) 156.8 kg (345 lb 10.9 oz)  12. Hypertension: Monitor BP, cont Lasix  Controlled 4/11 Vitals:   07/11/16 1440 07/12/16 0558  BP: 127/63 (!) 142/67  Pulse: (!) 54 (!) 55  Resp: 17 16  Temp: 98.6 F (37 C) 97.9 F (36.6 C)   13. Morbid obesity. BMI 48.56. Dietary follow-up  14. BPH. Proscar 5 mg daily.no retention   15. Hyperlipidemia. Lipitor 16. History of bilateral TKA. Patient used a Programmer, multimedia prior to admission 17.  Constipation-uses dulcolax at home  -senokot -s bid  -sorbitol prn 18.  Hx nasal congestion takes loratadine  19.  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 Hypokalemia  Persistent increase KCL  to 64mq- recheck BMET normal    LOS (Days) 21 A FACE TO FACE EVALUATION WAS PERFORMED  [name] E 07/12/2016, 7:48 AM

## 2016-07-12 NOTE — Progress Notes (Signed)
Social Work Discharge Note  The overall goal for the admission was met for:   Discharge location: Yes - home with family  Length of Stay: Yes - 21 days  Discharge activity level: Yes - supervision  Home/community participation: Yes  Services provided included: MD, RD, PT, OT, SLP, RN, Pharmacy, Neuropsych and SW  Financial Services: Medicare and Private Insurance: [employer]  Follow-up services arranged: Outpatient: PT/OT, DME: 22"x18" standard weight w/c with tension back and basic cushion; heavy duty walker; droparm commode; tub transfer bench and Patient/Family request agency HH: [facility], DME: [facility]  Comments (or additional information):  Pt's wife came through family education and feel prepared to provide pt supervision at home.  Home modifications have been made.  Pt is confident to return home and grateful for time on CIR.  Patient/Family verbalized understanding of follow-up arrangements: Yes  Individual responsible for coordination of the follow-up plan: pt with support from his wife and son  Confirmed correct DME delivered: ,  [facility] 07/12/2016    ,  [facility] 

## 2016-07-13 ENCOUNTER — Telehealth: Payer: Self-pay | Admitting: *Deleted

## 2016-07-13 ENCOUNTER — Encounter: Payer: Self-pay | Admitting: Occupational Therapy

## 2016-07-13 ENCOUNTER — Ambulatory Visit: Payer: Medicare Other | Admitting: Occupational Therapy

## 2016-07-13 ENCOUNTER — Telehealth (HOSPITAL_COMMUNITY): Payer: Self-pay

## 2016-07-13 ENCOUNTER — Other Ambulatory Visit: Payer: Self-pay | Admitting: *Deleted

## 2016-07-13 ENCOUNTER — Ambulatory Visit: Payer: Medicare Other | Attending: Physical Medicine & Rehabilitation | Admitting: Physical Therapy

## 2016-07-13 DIAGNOSIS — R2689 Other abnormalities of gait and mobility: Secondary | ICD-10-CM

## 2016-07-13 DIAGNOSIS — M6281 Muscle weakness (generalized): Secondary | ICD-10-CM | POA: Diagnosis not present

## 2016-07-13 DIAGNOSIS — I69354 Hemiplegia and hemiparesis following cerebral infarction affecting left non-dominant side: Secondary | ICD-10-CM | POA: Insufficient documentation

## 2016-07-13 DIAGNOSIS — R2681 Unsteadiness on feet: Secondary | ICD-10-CM

## 2016-07-13 MED ORDER — FLUTICASONE FUROATE-VILANTEROL 200-25 MCG/INH IN AEPB: 1.0000 | INHALATION_SPRAY | Freq: Every day | RESPIRATORY_TRACT | 0 refills | Status: DC

## 2016-07-13 NOTE — Telephone Encounter (Signed)
Medication management - Patient left a message that he had a stroke 06/19/16 and was just released from [facility] 07/12/16.  States they discharged him without Lorazepam 0.5 mg 1 qam, 2 qhs but had no reason as he asked his PA.  Patient states his voice box was partially paralyzed so some difficulty talking.  Would like to know if Dr. Adele Schilder wants him to still take Lorazepam and to provide instructions.  Patient requests call back to discuss and next appointment with Dr. Adele Schilder scheduled for 08/29/16.

## 2016-07-13 NOTE — Addendum Note (Signed)
Addended by: Geryl Rankins D on: 07/13/2016 04:20 PM   Modules accepted: Orders

## 2016-07-13 NOTE — Therapy (Signed)
[facility] [address] [place], [place], [address] Phone: [phone]   Fax:  [phone]  Physical Therapy Evaluation  Patient Details  Name: [name] MRN: [id number] Date of Birth: 1945-04-10 Referring Provider: Dr. Alysia Penna  Encounter Date: 07/13/2016      PT End of Session - 07/13/16 1459    Visit Number 1   Number of Visits 16   Date for PT Re-Evaluation 09/07/16   Authorization Type Medicare-G Codes and Progress Notes   PT Start Time 2376   PT Stop Time 1445   PT Time Calculation (min) 40 min   Equipment Utilized During Treatment Gait belt   Activity Tolerance Patient tolerated treatment well   Behavior During Therapy [facility] for tasks assessed/performed      Past Medical History:  Diagnosis Date  . Anemia 07/29/2013  . Asthma    childhood asthma  . Benign prostatic hypertrophy    several prostate biopsies neg for cancer.   . Bilateral hip pain   . Cancer ([place])   . CHF (congestive heart failure) ([place])   . Complication of anesthesia    MORPHINE CAUSES HALLUCINATIONS  . CVA (cerebral vascular accident) ([place]) 06/2016  . Depression   . FH: colonic polyps 2010  . Gout   . History of kidney stones   . Hypertension   . Kidney cysts   . Morbid obesity ([place])   . Numbness    feet / legs  . PAF (paroxysmal atrial fibrillation) with RVR 07/29/2013  . Sleep apnea    USES C PAP    Past Surgical History:  Procedure Laterality Date  . ARTHROSCOPY KNEE W/ DRILLING    . BREATH TEK H PYLORI N/A 12/23/2012   Procedure: BREATH TEK H PYLORI;  Surgeon: Gayland Curry, MD;  Location: [facility];  Service: General;  Laterality: N/A;  . CHOLECYSTECTOMY  1989  . COLONOSCOPY N/A 07/31/2013   Procedure: COLONOSCOPY;  Surgeon: Gatha Mayer, MD;  Location: [facility];  Service: Endoscopy;  Laterality: N/A;  . ESOPHAGOGASTRODUODENOSCOPY N/A 07/22/2013   Procedure: ESOPHAGOGASTRODUODENOSCOPY (EGD);   Surgeon: Irene Shipper, MD;  Location: [facility]OSCOPY;  Service: Endoscopy;  Laterality: N/A;  . ESOPHAGOGASTRODUODENOSCOPY N/A 07/31/2013   Procedure: ESOPHAGOGASTRODUODENOSCOPY (EGD);  Surgeon: Gatha Mayer, MD;  Location: [facility] ENDOSCOPY;  Service: Endoscopy;  Laterality: N/A;  . INTRAOPERATIVE TRANSESOPHAGEAL ECHOCARDIOGRAM N/A 07/18/2013   Procedure: INTRAOPERATIVE TRANSESOPHAGEAL ECHOCARDIOGRAM;  Surgeon: Grace Isaac, MD;  Location: [facility];  Service: Open Heart Surgery;  Laterality: N/A;  . LAPAROSCOPIC GASTRIC BANDING WITH HIATAL HERNIA REPAIR N/A 04/08/2013   Procedure: LAPAROSCOPIC GASTRIC BANDING WITH possible  HIATAL HERNIA REPAIR;  Surgeon: Gayland Curry, MD;  [facility];  Service: General;  Laterality: N/A;  . LITHOTRIPSY    . renal calculi    . SUBXYPHOID PERICARDIAL WINDOW N/A 07/18/2013   Procedure: SUBXYPHOID PERICARDIAL WINDOW;  Surgeon: Grace Isaac, MD;  Location: [facility];  Service: Thoracic;  Laterality: N/A;  . TONSILLECTOMY    . total knee replacment     bilat  . trigger finger repair     also lue, cts surgically repaired  . WISDOM TOOTH EXTRACTION      There were no vitals filed for this visit.       Subjective Assessment - 07/13/16 1408    Subjective Pt is a 71 y/o male who presents to OPPT s/p L medullary infarct on 06/19/16.  Pt hospitalized 3/19-4/11 including CIR admission.  Pt presents today with continued difficulty with mobility and "barely" walking with a RW.     Patient is accompained by: Family member   Pertinent History asthma, basal cell carcinoma, obesity, gout, depression, HTN, OSA, peripheral neuropathy, CHF, PAF, PE, bil TKA   Limitations Walking   Patient Stated Goals improve walking, balance, wants to drive again and be independent   Currently in Pain? No/denies            [facility] PT Assessment - 07/13/16 1415      Assessment   Medical Diagnosis Lt CVA   Referring Provider Dr. Alysia Penna   Onset Date/Surgical Date 06/19/16    Hand Dominance Right   Next MD Visit 07/25/16   Prior Therapy CIR     Precautions   Precautions Fall     Restrictions   Weight Bearing Restrictions No     Balance Screen   Has the patient fallen in the past 6 months Yes   How many times? 1- when CVA occured   Has the patient had a decrease in activity level because of a fear of falling?  Yes   Is the patient reluctant to leave their home because of a fear of falling?  No     Home Environment   Living Environment Private residence   Living Arrangements Spouse/significant other   Type of [place] to enter   Entrance Stairs-Number of Steps 7   Entrance Stairs-Rails Cannot reach both;Left;Right   Home Layout One level   [place] - 2 wheels;Wheelchair - [employer];Tub bench     Prior Function   Level of Independence Independent   Vocation Retired   [employer] retired from [employer]   Leisure play on computer, write book, play with granddaughter     Cognition   Overall Cognitive Status Within Functional Limits for tasks assessed     Posture/Postural Control   Posture/Postural Control Postural limitations   Postural Limitations Flexed trunk     Strength   Overall Strength Comments suspected hip extension and abduction weakness given functional deficits listed   Strength Assessment Site Hip;Knee;Ankle   Right/Left Hip Right;Left   Right Hip Flexion 4+/5   Left Hip Flexion 4/5   Right/Left Knee Right;Left   Right Knee Flexion 5/5   Right Knee Extension 5/5   Left Knee Flexion 3+/5   Left Knee Extension 5/5   Right/Left Ankle Right;Left   Right Ankle Dorsiflexion 5/5   Left Ankle Dorsiflexion 4/5   Left Ankle Inversion 3/5   Left Ankle Eversion 3/5     Transfers   Transfers Sit to Stand;Stand to Sit   Sit to Stand 5: Supervision   Sit to Stand Details Verbal cues for sequencing;Verbal cues for technique   Sit to Stand Details (indicate cue type  and reason) heavy reliance on UEs to stand   Stand to Sit 5: Supervision   Stand to Sit Details heavy reliance on UEs     Ambulation/Gait   Ambulation/Gait Yes   Ambulation/Gait Assistance 4: Min guard   Ambulation Distance (Feet) 150 Feet   Assistive device Rolling walker   Gait Pattern Step-to pattern;Ataxic;Trunk flexed;Wide base of support;Poor foot clearance - left   Gait velocity 0.81 ft/sec  40.41 sec    Gait Comments utilized Lt ASO for ankle stability     Standardized Balance Assessment   Standardized Balance Assessment Timed Up and Go Test     Timed Up and  Go Test   Normal TUG (seconds) 37.25  with RW                           PT Education - 07/13/16 1459    Education provided Yes   Education Details clinical findings, POC, goals of care   Person(s) Educated Patient   Methods Explanation   Comprehension Verbalized understanding          PT Short Term Goals - 07/13/16 1506      PT SHORT TERM GOAL #1   Title verbalize understanding of CVA risk factors/warning signs (07/11/16)   Time 4   Period Weeks   Status New     PT SHORT TERM GOAL #2   Title improve timed up and go to < 30 sec for improved mobility (08/10/16)   Time 4   Period Weeks   Status New     PT SHORT TERM GOAL #3   Title improve gait velocity to > 1.32 ft/sec for improved mobility and community access (08/10/16)   Time 4   Period Weeks   Status New     PT SHORT TERM GOAL #4   Title amb > 300' on level indoor/paved outdoor surfaces with LRAD with supervision for improved community access (08/10/16)   Time 4   Period Weeks   Status New     PT SHORT TERM GOAL #5   Title perform BERG with appropriate LTG to be written (08/10/16)   Time 4   Period Weeks   Status New           PT Long Term Goals - 07/13/16 1508      PT LONG TERM GOAL #1   Title independent with HEP (09/07/16)   Time 8   Period Weeks   Status New     PT LONG TERM GOAL #2   Title improve timed up  and go to < 21 sec with LRAD for improved mobility (09/07/16)   Time 8   Period Weeks   Status New     PT LONG TERM GOAL #3   Title improve gait velocity to > 1.8 ft/sec for improved mobility and decreased fall risk (09/07/16)   Time 8   Period Weeks   Status New     PT LONG TERM GOAL #4   Title amb > 500' on various indoor/outdoor surfaces with LRAD modified independent for improved independence and mobility (09/07/16)   Time 8   Period Weeks   Status New               Plan - 07/13/16 1502    Clinical Impression Statement Pt is a 71 y/o male who presents to OPPT for high level complexity eval s/p Lt medullary infarct resulting in Lt sided weakness.  Pt demonstrates decreased strength, gait abnormalities, and decreased balance affecting functional mobilty.  Will benefit from PT to address deficits listed.   Rehab Potential Good   Clinical Impairments Affecting Rehab Potential multiple comorbidities   PT Frequency 2x / week   PT Duration 8 weeks   PT Treatment/Interventions ADLs/Self Care Home Management;Cryotherapy;Electrical Stimulation;Moist Heat;Neuromuscular re-education;Balance training;Therapeutic exercise;Therapeutic activities;Functional mobility training;Gait training;Stair training;Patient/family education;Orthotic Fit/Training;Manual techniques;Vestibular;Energy conservation   PT Next Visit Plan perform BERG, establish HEP for balance and strengthening, gait with RW   Consulted and Agree with Plan of Care Patient;Family member/caregiver   Family Member Consulted spouse      Patient will benefit from skilled therapeutic  intervention in order to improve the following deficits and impairments:  Abnormal gait, Decreased balance, Decreased activity tolerance, Decreased strength, Postural dysfunction, Obesity, Decreased endurance, Decreased knowledge of use of DME, Decreased mobility  Visit Diagnosis: Hemiplegia and hemiparesis following cerebral infarction affecting left  non-dominant side ([place]) - Plan: PT plan of care cert/re-cert  Other abnormalities of gait and mobility - Plan: PT plan of care cert/re-cert  Muscle weakness (generalized) - Plan: PT plan of care cert/re-cert  Unsteadiness on feet - Plan: PT plan of care cert/re-cert      G-Codes - 04/11/30 1513    Functional Assessment Tool Used (Outpatient Only) clinical judgement; gait velocity, timed up and go   Functional Limitation Mobility: Walking and moving around   Mobility: Walking and Moving Around Current Status [phone]) At least 60 percent but less than 80 percent impaired, limited or restricted   Mobility: Walking and Moving Around Goal Status [phone]) At least 20 percent but less than 40 percent impaired, limited or restricted       Problem List Patient Active Problem List   Diagnosis Date Noted  . Benign essential HTN   . Leukocytosis   . 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 Embolic cerebral infarction ([place]) 06/21/2016  . Stroke syndrome ([place])   . OSA (obstructive sleep apnea)   . Hyperlipidemia   . Left-sided weakness   . History of pulmonary embolism   . History of total knee arthroplasty, bilateral   . Dysphagia, post-stroke   . Hypokalemia   . Ataxia, post-stroke   . Neurologic gait disorder   . Headache 06/19/2016  . Claudication ([place]) 04/28/2016  . Encounter for monitoring flecainide therapy 04/28/2016  . Shortness of breath 04/28/2016  . Pure hyperglyceridemia 04/26/2016  . Skin lesion of chest wall 04/26/2016  . Recurrent pulmonary emboli ([place]) 06/13/2015  . PE (pulmonary embolism) 06/13/2015  . GERD (gastroesophageal reflux disease) 06/13/2015  . Asthma 06/13/2015  . Basal cell carcinoma of skin 06/28/2014  . 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 Sinus bradycardia 12/15/2013  . Abnormal serum level of alkaline phosphatase 08/01/2013  . PAF (paroxysmal atrial fibrillation) ([place]) 07/29/2013  . Chronic anticoagulation 07/22/2013  . Gastric ulcer with  hemorrhage 07/20/13 07/22/2013  . Pulmonary thromboembolism- 07/16/13 07/16/2013  . Pericardial effusion s/p pericardial window 07/18/13 400cc 07/16/2013  . Diastolic congestive heart failure ([place]) 07/16/2013  . H/O laparoscopic adjustable gastric banding & hiatal hernia repair 04/08/13 04/23/2013  . Prediabetes 02/05/2013  . TMJ (temporomandibular joint syndrome) 09/11/2012  . Morbid obesity ([place]) 08/09/2012  . Peripheral neuropathy ([place]) 11/02/2009  . Hyperlipidemia with target LDL less than 130 09/10/2008  . Depression 09/10/2008  . Obstructive sleep apnea 09/10/2008  . GOUT 06/28/2006  . HTN (hypertension) 06/28/2006  . BPH (benign prostatic hyperplasia) 06/28/2006      Laureen Abrahams, PT, DPT 07/13/16 3:15 PM    [facility] [address] [place], [place], [address] Phone: [phone]   Fax:  [phone]  Name: NUSSEN PULLIN MRN: [id number] Date of Birth: January 01, 1946

## 2016-07-13 NOTE — Telephone Encounter (Signed)
Medication ordered

## 2016-07-13 NOTE — Telephone Encounter (Signed)
Transition Care Management Follow-up Telephone Call   Date discharged? 07/12/16   How have you been since you were released from the hospital? Spoke w/wife she states he is doing alright   Do you understand why you were in the hospital? YES   Do you understand the discharge instructions? YES   Where were you discharged to? Home   Items Reviewed:  Medications reviewed: YES  Allergies reviewed: YES  Dietary changes reviewed: NO  Referrals reviewed: NO   Functional Questionnaire:   Activities of Daily Living (ADLs):   She states she are independent in the following: bathing and hygiene, feeding, continence, grooming, toileting and dressing States he require assistance with the following: ambulation  Any transportation issues/concerns?: YES   Any patient concerns? YES   Confirmed importance and date/time of follow-up visits scheduled YES, appt 07/17/16 Provider Appointment booked with Dr. Ronnald Ramp  Confirmed with patient if condition begins to worsen call PCP or go to the ER.  Patient was given the office number and encouraged to call back with question or concerns. YES

## 2016-07-13 NOTE — Telephone Encounter (Signed)
Received a telephone message and fax concerning patient's hospital discharge medication Dulera inhaler.  It is not covered by patient's insurance. We either need to prove a prior authorization or offer up alternative medications.  They offer Breo Ellipta 200-25 MCG INH, Advair 500-50 diskus, and Advair HFA 230-21 mcg inhaler.  Fax was sent over to the PA's office at [facility] for review

## 2016-07-13 NOTE — Therapy (Signed)
[facility] [address] [place], [place], [address] Phone: [phone]   Fax:  [phone]  Occupational Therapy Evaluation  Patient Details  Name: [name] MRN: [id number] Date of Birth: [birth year] Referring Provider: Dr. Letta Pate  Encounter Date: 07/13/2016      OT End of Session - 07/13/16 1704    Visit Number 1   Number of Visits 1   Authorization Type Medicare   OT Start Time 0017   OT Stop Time 1528   OT Time Calculation (min) 42 min   Activity Tolerance Patient tolerated treatment well      Past Medical History:  Diagnosis Date  . Anemia 07/29/2013  . Asthma    childhood asthma  . Benign prostatic hypertrophy    several prostate biopsies neg for cancer.   . Bilateral hip pain   . Cancer ([place])   . CHF (congestive heart failure) ([place])   . Complication of anesthesia    MORPHINE CAUSES HALLUCINATIONS  . CVA (cerebral vascular accident) ([place]) 06/2016  . Depression   . FH: colonic polyps 2010  . Gout   . History of kidney stones   . Hypertension   . Kidney cysts   . Morbid obesity ([place])   . Numbness    feet / legs  . PAF (paroxysmal atrial fibrillation) with RVR 07/29/2013  . Sleep apnea    USES C PAP    Past Surgical History:  Procedure Laterality Date  . ARTHROSCOPY KNEE W/ DRILLING    . BREATH TEK H PYLORI N/A 12/23/2012   Procedure: BREATH TEK H PYLORI;  Surgeon: Gayland Curry, MD;  Location: [facility];  Service: General;  Laterality: N/A;  . CHOLECYSTECTOMY  1989  . COLONOSCOPY N/A 07/31/2013   Procedure: COLONOSCOPY;  Surgeon: Gatha Mayer, MD;  Location: [facility];  Service: Endoscopy;  Laterality: N/A;  . ESOPHAGOGASTRODUODENOSCOPY N/A 07/22/2013   Procedure: ESOPHAGOGASTRODUODENOSCOPY (EGD);  Surgeon: Irene Shipper, MD;  [facility];  Service: Endoscopy;  Laterality: N/A;  . ESOPHAGOGASTRODUODENOSCOPY N/A 07/31/2013   Procedure: ESOPHAGOGASTRODUODENOSCOPY  (EGD);  Surgeon: Gatha Mayer, MD;  [facility] ENDOSCOPY;  Service: Endoscopy;  Laterality: N/A;  . INTRAOPERATIVE TRANSESOPHAGEAL ECHOCARDIOGRAM N/A 07/18/2013   Procedure: INTRAOPERATIVE TRANSESOPHAGEAL ECHOCARDIOGRAM;  Surgeon: Grace Isaac, MD;  Location: [facility];  Service: Open Heart Surgery;  Laterality: N/A;  . LAPAROSCOPIC GASTRIC BANDING WITH HIATAL HERNIA REPAIR N/A 04/08/2013   Procedure: LAPAROSCOPIC GASTRIC BANDING WITH possible  HIATAL HERNIA REPAIR;  Surgeon: Gayland Curry, MD;  Location: [facility];  Service: General;  Laterality: N/A;  . LITHOTRIPSY    . renal calculi    . SUBXYPHOID PERICARDIAL WINDOW N/A 07/18/2013   Procedure: SUBXYPHOID PERICARDIAL WINDOW;  Surgeon: Grace Isaac, MD;  Location: [facility];  Service: Thoracic;  Laterality: N/A;  . TONSILLECTOMY    . total knee replacment     bilat  . trigger finger repair     also lue, cts surgically repaired  . WISDOM TOOTH EXTRACTION      There were no vitals filed for this visit.      Subjective Assessment - 07/13/16 1453    Patient is accompained by: Family member  wife [name]   Pertinent History see epic - L medullary CVA.     Patient Stated Goals to be able to stand and keep my balance, not lean , be able to walk , get my voice back, strengthen my leg   Currently in  Pain? No/denies  Pt reports he can get headaches with exercise           [facility] OT Assessment - 07/13/16 1455      Assessment   Diagnosis L medullary CVA   Referring Provider Dr. Letta Pate   Onset Date 06/19/16   Prior Therapy inpt PT, OT , ST     Precautions   Precautions Fall     Restrictions   Weight Bearing Restrictions No     Balance Screen   Has the patient fallen in the past 6 months Yes  fell out of the bed when sleeping had PT eval today     Prior Function   Level of Independence Independent   Vocation Retired   [employer] retired from working in Pharmacist, community since 2000   Leisure play on Teaching laboratory technician,  writing book, play with granddtr      ADL   Eating/Feeding Independent   Grooming Independent   Upper Body Bathing Supervision/safety   Lower Body Bathing Supervision/safety  leans side to side to bath bottom/using LH sponge   Upper Body Dressing Independent   Lower Body Dressing Supervision/safety  occasional assist with shoes   Toilet Tranfer Supervision/safety  using walker   Toileting - Clothing Manipulation Modified independent   [name]   ADL comments 3 in 1 commode over the toilet, transfer tub bench, installing grab bars at some point.      IADL   Shopping Assistance for transportation;Needs to be accompanied on any shopping trip   Light Housekeeping Does not participate in any housekeeping tasks   Meal Prep --  wife did mcirowaving and ate take out.    Community Mobility Relies on family or friends for transportation   Medication Management Is responsible for taking medication in correct dosages at correct time   Physiological scientist financial matters independently (budgets, writes checks, pays rent, bills goes to bank), collects and keeps track of income     Mobility   Mobility Status Needs assist   Mobility Status Comments contact guard to ambulate short distance outside with walker     Written Expression   Dominant Hand Right     Vision - History   Baseline Vision Wears glasses all the time  trifocals   Additional Comments reports he had diplopia now only has with superior vision.      Vision Assessment   Ocular Range of Motion Within Functional Limits   Tracking/Visual Pursuits Able to track stimulus in all quads without difficulty     Cognition   Overall Cognitive Status Within Functional Limits for tasks assessed     Sensation   Light Touch Appears Intact   Hot/Cold Appears Intact   Proprioception Appears Intact     Coordination   Gross Motor Movements are Fluid and Coordinated Yes   Fine Motor Movements are  Fluid and Coordinated Yes     Hand Function   Right Hand Gross Grasp Functional   Right Hand Grip (lbs) 83   Left Hand Gross Grasp Functional   Left Hand Grip (lbs) 75                           OT Short Term Goals - 07/13/16 1702      OT SHORT TERM GOAL #1   Title n/a           OT Long Term Goals - 07/13/16 1702  OT LONG TERM GOAL #1   Title n/a               Plan - 22-Jul-2016 1702    Clinical Impression Statement Pt is a 71 year male s/p L medullary CVA on 06/19/2016.  PMH: anmeia, asthma, morbid obesity, cancer, AFIB,CHF, HGN, anxiety. Pt presents today with primarily balance deficits and is only requiring supervision for functional mobility for ADL and IADL activities.  Balance and mobility to be addressed by PT and pt does not need OT follow up at this time. Pt and wife in agreement.    Plan no OT follow up at this time   Consulted and Agree with Plan of Care Patient;Family member/caregiver   Family Member Consulted wife      Patient will benefit from skilled therapeutic intervention in order to improve the following deficits and impairments:     Visit Diagnosis: Unsteadiness on feet      G-Codes - 07/22/16 1705    Functional Assessment Tool Used (Outpatient only) dynamometer, MMT   Functional Limitation Carrying, moving and handling objects   Carrying, Moving and Handling Objects Current Status [phone]) At least 1 percent but less than 20 percent impaired, limited or restricted   Carrying, Moving and Handling Objects Goal Status (R1540) At least 1 percent but less than 20 percent impaired, limited or restricted   Carrying, Moving and Handling Objects Discharge Status [phone]) At least 1 percent but less than 20 percent impaired, limited or restricted      Problem List Patient Active Problem List   Diagnosis Date Noted  . Benign essential HTN   . Leukocytosis   . 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 Embolic cerebral infarction ([place])  06/21/2016  . Stroke syndrome ([place])   . OSA (obstructive sleep apnea)   . Hyperlipidemia   . Left-sided weakness   . History of pulmonary embolism   . History of total knee arthroplasty, bilateral   . Dysphagia, post-stroke   . Hypokalemia   . Ataxia, post-stroke   . Neurologic gait disorder   . Headache 06/19/2016  . Claudication ([place]) 04/28/2016  . Encounter for monitoring flecainide therapy 04/28/2016  . Shortness of breath 04/28/2016  . Pure hyperglyceridemia 04/26/2016  . Skin lesion of chest wall 04/26/2016  . Recurrent pulmonary emboli ([place]) 06/13/2015  . PE (pulmonary embolism) 06/13/2015  . GERD (gastroesophageal reflux disease) 06/13/2015  . Asthma 06/13/2015  . Basal cell carcinoma of skin 06/28/2014  . 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 Sinus bradycardia 12/15/2013  . Abnormal serum level of alkaline phosphatase 08/01/2013  . PAF (paroxysmal atrial fibrillation) ([place]) 07/29/2013  . Chronic anticoagulation 07/22/2013  . Gastric ulcer with hemorrhage 07/20/13 07/22/2013  . Pulmonary thromboembolism- 07/16/13 07/16/2013  . Pericardial effusion s/p pericardial window 07/18/13 400cc 07/16/2013  . Diastolic congestive heart failure ([place]) 07/16/2013  . H/O laparoscopic adjustable gastric banding & hiatal hernia repair 04/08/13 04/23/2013  . Prediabetes 02/05/2013  . TMJ (temporomandibular joint syndrome) 09/11/2012  . Morbid obesity ([place]) 08/09/2012  . Peripheral neuropathy ([place]) 11/02/2009  . Hyperlipidemia with target LDL less than 130 09/10/2008  . Depression 09/10/2008  . Obstructive sleep apnea 09/10/2008  . GOUT 06/28/2006  . HTN (hypertension) 06/28/2006  . BPH (benign prostatic hyperplasia) 06/28/2006    Quay Burow, OTR/L 07/22/2016, 5:06 PM  [facility] [address] [place], [place], [address] Phone: [phone]   Fax:  [phone]  Name: EMETERIO BALKE MRN:  [id number]  Date of Birth: 1945/11/04

## 2016-07-13 NOTE — Telephone Encounter (Signed)
Breo Ellipta 200-25 MCG INH One month supply one puff po daily

## 2016-07-13 NOTE — Telephone Encounter (Signed)
I returned patient's phone call.  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  He is getting physical therapy and improving slowly and gradually.  He is not sure why lorazepam was discontinued upon discharge.  Patient told just a few minutes ago he received a phone call from hospital recommending to restart the lorazepam.  I explained that orazepam is sedative and there is a chance of dependency and tolerance but he has been taking the medication for many years and never requested early refills.  I recommended to resume lorazepam 0.5 mg twice a day and he can try stopping the Risperdal since it increases the risk of stroke.  Patient agreed with the plan.  He will restart lorazepam 0.5 mg twice a day and if needed 1 mg at bedtime to help sleep and anxiety.  He will discontinue Risperdal.  I recommended to call us back if he has any further question or symptoms started to get worse.

## 2016-07-17 ENCOUNTER — Encounter: Payer: Self-pay | Admitting: Internal Medicine

## 2016-07-17 ENCOUNTER — Ambulatory Visit (INDEPENDENT_AMBULATORY_CARE_PROVIDER_SITE_OTHER): Payer: Medicare Other | Admitting: Internal Medicine

## 2016-07-17 VITALS — BP 130/70 | HR 55 | Temp 98.3°F | Resp 16 | Ht 72.0 in | Wt 351.5 lb

## 2016-07-17 DIAGNOSIS — I480 Paroxysmal atrial fibrillation: Secondary | ICD-10-CM | POA: Diagnosis not present

## 2016-07-17 DIAGNOSIS — I10 Essential (primary) hypertension: Secondary | ICD-10-CM

## 2016-07-17 DIAGNOSIS — I63112 Cerebral infarction due to embolism of left vertebral artery: Secondary | ICD-10-CM

## 2016-07-17 NOTE — Progress Notes (Signed)
Subjective:  Patient ID: [name], male    DOB: 05-10-1945  Age: 71 y.o. 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  Outpatient Medications Prior to Visit  Medication Sig Dispense Refill  . apixaban (ELIQUIS) 5 MG TABS tablet Take 1 tablet (5 mg total) by mouth 2 (two) times daily. 180 tablet 3  . aspirin 81 MG tablet Take 81 mg by mouth 2 (two) times daily.     [name] atorvastatin (LIPITOR) 80 MG tablet Take 1 tablet (80 mg total) by mouth daily. 30 tablet 0  . budesonide-formoterol (SYMBICORT) 160-4.5 MCG/ACT inhaler Inhale 2 puffs into the lungs 2 (two) times daily as needed (shortness of breath).    . CALCIUM PO Take 500 mg by mouth 3 (three) times daily.     . Cyanocobalamin (VITAMIN B-12) 1000 MCG SUBL Place 1,000 mcg under the tongue every evening.     [name] Sodium (DSS) 100 MG CAPS Take 100 mg by mouth every other day.    . finasteride (PROSCAR) 5 MG tablet Take 1 tablet (5 mg total) by mouth daily. 30 tablet 1  . flecainide (TAMBOCOR) 50 MG tablet Take 1 tablet (50 mg total) by mouth every 12 (twelve) hours. 180 tablet 2  . fluticasone furoate-vilanterol (BREO ELLIPTA) 200-25 MCG/INH AEPB Inhale 1 puff into the lungs daily. 1 each 0  . furosemide (LASIX) 40 MG tablet Take 1 tablet (40 mg total) by mouth daily. 30 tablet 1  . loratadine (CLARITIN) 10 MG tablet Take 10 mg by mouth daily.    . mometasone-formoterol (DULERA) 200-5 MCG/ACT AERO Inhale 2 puffs into the lungs 2 (two) times daily. 1 Inhaler 1  . montelukast (SINGULAIR) 10 MG tablet TAKE 1 TABLET BY MOUTH ONCE A DAY AT BEDTIME 30 tablet 1  . pantoprazole (PROTONIX) 40 MG tablet Take 1 tablet (40 mg total) by mouth daily. 30 tablet 1  . potassium chloride SA (K-DUR,KLOR-CON) 20 MEQ tablet Take 2 tablets (40 mEq  total) by mouth daily. 30 tablet 1  . risperiDONE (RISPERDAL) 0.5 MG tablet Take 1 tablet (0.5 mg total) by mouth at bedtime. 30 tablet 2  . rOPINIRole (REQUIP) 0.5 MG tablet Take 1 tablet (0.5 mg total) by mouth at bedtime. 30 tablet 1  . sertraline (ZOLOFT) 50 MG tablet Take 3 tablets (150 mg total) by mouth daily. 30 tablet 1  . traMADol (ULTRAM) 50 MG tablet Take 1 tablet (50 mg total) by mouth every 8 (eight) hours as needed for severe pain. 20 tablet 0   No facility-administered medications prior to visit.     ROS Review of Systems  Constitutional: Positive for activity change and fatigue. Negative for chills and diaphoresis.  HENT: Negative.   Eyes: Positive for visual disturbance (DV).  Respiratory: Negative for cough, chest tightness, shortness of breath and wheezing.   Cardiovascular: Negative for chest pain, palpitations and leg swelling.  Gastrointestinal: Negative for abdominal pain, constipation, diarrhea and nausea.  Endocrine: Negative.   Genitourinary: Negative.  Negative for difficulty urinating.  Musculoskeletal: Positive for gait problem. Negative for neck pain.  Skin: Negative.   Neurological: Positive for weakness (left foot). Negative for dizziness, light-headedness and headaches.  Hematological: Negative for adenopathy. Does not bruise/bleed easily.  Psychiatric/Behavioral: Negative.  Objective:  BP 130/70 (BP Location: Left Arm, Patient Position: Sitting, Cuff Size: Large)   Pulse (!) 55   Temp 98.3 F (36.8 C) (Oral)   Resp 16   Ht 6' (1.829 m)   Wt (!) 351 lb 8 oz (159.4 kg)   SpO2 98%   BMI 47.67 kg/m   BP Readings from Last 3 Encounters:  07/17/16 130/70  07/12/16 (!) 142/67  06/21/16 (!) 144/75    Wt Readings from Last 3 Encounters:  07/17/16 (!) 351 lb 8 oz (159.4 kg)  07/12/16 (!) 345 lb 10.9 oz (156.8 kg)  06/19/16 (S) (!) 358 lb 0.4 oz (162.4 kg)    Physical Exam  Constitutional: He is oriented to person, place, and time. No  distress.  HENT:  Mouth/Throat: Oropharynx is clear and moist. No oropharyngeal exudate.  Eyes: Conjunctivae are normal. Right eye exhibits no discharge. Left eye exhibits no discharge. No scleral icterus.  Neck: Normal range of motion. Neck supple. No JVD present. No tracheal deviation present. No thyromegaly present.  Cardiovascular: Normal rate, regular rhythm, normal heart sounds and intact distal pulses.  Exam reveals no gallop and no friction rub.   No murmur heard. Pulmonary/Chest: Effort normal and breath sounds normal. No stridor. No respiratory distress. He has no wheezes. He has no rales. He exhibits no tenderness.  Abdominal: Soft. Bowel sounds are normal. He exhibits no distension and no mass. There is no tenderness. There is no rebound and no guarding.  Musculoskeletal: Normal range of motion. He exhibits no edema, tenderness or deformity.  Lymphadenopathy:    He has no cervical adenopathy.  Neurological: He is alert and oriented to person, place, and time. He has normal reflexes. He displays no atrophy and no tremor. No cranial nerve deficit or sensory deficit. He exhibits abnormal muscle tone. He displays no seizure activity. Coordination and gait abnormal.  w/c bound  Skin: Skin is warm and dry. No rash noted. He is not diaphoretic. No erythema. No pallor.  Vitals reviewed.   Lab Results  Component Value Date   WBC 8.7 07/10/2016   HGB 13.2 07/10/2016   HCT 41.0 07/10/2016   PLT 196 07/10/2016   GLUCOSE 101 (H) 07/12/2016   CHOL 124 06/19/2016   TRIG 74 06/19/2016   HDL 32 (L) 06/19/2016   LDLDIRECT 168.0 10/20/2015   LDLCALC 77 06/19/2016   ALT 19 06/22/2016   AST 26 06/22/2016   NA 143 07/12/2016   K 3.6 07/12/2016   CL 105 07/12/2016   CREATININE 1.05 07/12/2016   BUN 19 07/12/2016   CO2 27 07/12/2016   TSH 2.68 10/20/2015   PSA 0.68 12/24/2015   INR 1.09 06/19/2016   HGBA1C 5.8 (H) 06/19/2016   MICROALBUR 1.1 12/12/2006    Dg Swallowing Func-speech  Pathology  Result Date: 06/21/2016 Objective Swallowing Evaluation: Type of Study: MBS-Modified Barium Swallow Study Patient Details Name: DIDIER BRANDENBURG MRN: [id number] Date of Birth: 06/09/1945 Today's Date: 06/21/2016 Time: SLP Start Time (ACUTE ONLY): 0754-SLP Stop Time (ACUTE ONLY): 0820 SLP Time Calculation (min) (ACUTE ONLY): 26 min Past Medical History: Past Medical History: Diagnosis Date . Anemia 07/29/2013 . Asthma   childhood asthma . Benign prostatic hypertrophy   several prostate biopsies neg for cancer.  . Bilateral hip pain  . Cancer ([place])  . CHF (congestive heart failure) ([place])  . Complication of anesthesia   MORPHINE CAUSES HALLUCINATIONS . Depression  . FH: colonic polyps 2010 . Gout  . History of kidney stones  .  Hypertension  . Kidney cysts  . Morbid obesity ([place])  . Numbness   feet / legs . PAF (paroxysmal atrial fibrillation) with RVR 07/29/2013 . Sleep apnea   USES C PAP Past Surgical History: Past Surgical History: Procedure Laterality Date . ARTHROSCOPY KNEE W/ DRILLING   . BREATH TEK H PYLORI N/A 12/23/2012  Procedure: BREATH TEK H PYLORI;  Surgeon: Gayland Curry, MD;  Location: [facility];  Service: General;  Laterality: N/A; . CHOLECYSTECTOMY  1989 . COLONOSCOPY N/A 07/31/2013  Procedure: COLONOSCOPY;  Surgeon: Gatha Mayer, MD;  Location: [place];  Service: Endoscopy;  Laterality: N/A; . ESOPHAGOGASTRODUODENOSCOPY N/A 07/22/2013  Procedure: ESOPHAGOGASTRODUODENOSCOPY (EGD);  Surgeon: Irene Shipper, MD;  Location[facility];  Service: Endoscopy;  Laterality: N/A; . ESOPHAGOGASTRODUODENOSCOPY N/A 07/31/2013  Procedure: ESOPHAGOGASTRODUODENOSCOPY (EGD);  Surgeon: Gatha Mayer, MD;  Location: [facility] ENDOSCOPY;  Service: Endoscopy;  Laterality: N/A; . INTRAOPERATIVE TRANSESOPHAGEAL ECHOCARDIOGRAM N/A 07/18/2013  Procedure: INTRAOPERATIVE TRANSESOPHAGEAL ECHOCARDIOGRAM;  Surgeon: Grace Isaac, MD;  Location: [place];  Service: Open Heart Surgery;  Laterality: N/A; . LAPAROSCOPIC  GASTRIC BANDING WITH HIATAL HERNIA REPAIR N/A 04/08/2013  Procedure: LAPAROSCOPIC GASTRIC BANDING WITH possible  HIATAL HERNIA REPAIR;  Surgeon: Gayland Curry, MD;  Location: [facility];  Service: General;  Laterality: N/A; . LITHOTRIPSY   . renal calculi   . SUBXYPHOID PERICARDIAL WINDOW N/A 07/18/2013  Procedure: SUBXYPHOID PERICARDIAL WINDOW;  Surgeon: Grace Isaac, MD;  Location: [facility];  Service: Thoracic;  Laterality: N/A; . TONSILLECTOMY   . total knee replacment    bilat . trigger finger repair    also lue, cts surgically repaired . WISDOM TOOTH EXTRACTION   HPI: 71 yo male adm to [facility] with headache, found to have single focus of remote microhemorrhage in the L Pon, abnormal signal within V4 segment of L vertebral artery, faint focus of reduced diffusion within L medulla concerning for small infarct. Pt with worsening neurological symptoms during hospital stay including left sided weakness, dysphagia, vision deficits and dizziness.  Pt passed RNSSS initially- SlP swallow evaluation ordered due to pt's sudden onset of dysphagia.   Subjective: pt awake in chair Assessment / Plan / Recommendation CHL IP CLINICAL IMPRESSIONS 06/21/2016 Clinical Impression Pt presents with mild oropharyngeal dysphagia resulting in decreased swallow coordination.  Explosive coughing noted with large amount of aspiration of thin *pt cued to "gulp", aspirates were deep and did not clear.  Small single boluses of thin result in trace laryngeal penetration of thin clearing with intermittent cued throat clear.  Chin tuck posture worsened swalllow/penetration.  Pharyngeal swallow c/b decreased timing of laryngeal closure and decreased tongue base retraction/epiglottic deflection with mild vallecular residuals of thicker consistencies.  Fortunately pt sensed residuals and conducted dry swallow to clear.  Of note, pt frequently cleared his throat during evaluation/MBS and reports occured prior to admission - he attributes this to nasal  drainage/allergies.  Using live video, educated pt to findings/recommendations.  SLP will follow up for skilled SLP treatment to maximize swallow/phonation function.     SLP Visit Diagnosis Dysphagia, oropharyngeal phase (R13.12) Attention and concentration deficit following -- Frontal lobe and executive function deficit following -- Impact on safety and function Mild aspiration risk   CHL IP TREATMENT RECOMMENDATION 06/21/2016 Treatment Recommendations Therapy as outlined in treatment plan below   Prognosis 06/21/2016 Prognosis for Safe Diet Advancement Good Barriers to Reach Goals -- Barriers/Prognosis Comment -- CHL IP DIET RECOMMENDATION 06/21/2016 SLP Diet Recommendations Dysphagia 3 (Mech soft) solids;Thin liquid Liquid Administration  via Cup;No straw Medication Administration Whole meds with puree Compensations Slow rate;Small sips/bites;Multiple dry swallows after each bite/sip;Clear throat intermittently Postural Changes Remain semi-upright after after feeds/meals (Comment);Seated upright at 90 degrees   CHL IP OTHER RECOMMENDATIONS 06/21/2016 Recommended Consults -- Oral Care Recommendations Oral care BID Other Recommendations --   CHL IP FOLLOW UP RECOMMENDATIONS 06/21/2016 Follow up Recommendations Inpatient Rehab   CHL IP FREQUENCY AND DURATION 06/21/2016 Speech Therapy Frequency (ACUTE ONLY) min 2x/week Treatment Duration --      CHL IP ORAL PHASE 06/21/2016 Oral Phase Impaired Oral - Pudding Teaspoon -- Oral - Pudding Cup -- Oral - Honey Teaspoon -- Oral - Honey Cup -- Oral - Nectar Teaspoon -- Oral - Nectar Cup WFL Oral - Nectar Straw -- Oral - Thin Teaspoon WFL Oral - Thin Cup WFL Oral - Thin Straw WFL Oral - Puree WFL Oral - Mech Soft -- Oral - Regular WFL Oral - Multi-Consistency -- Oral - Pill Weak lingual manipulation;Reduced posterior propulsion;Delayed oral transit Oral Phase - Comment --  CHL IP PHARYNGEAL PHASE 06/21/2016 Pharyngeal Phase Impaired Pharyngeal- Pudding Teaspoon -- Pharyngeal --  Pharyngeal- Pudding Cup -- Pharyngeal -- Pharyngeal- Honey Teaspoon -- Pharyngeal -- Pharyngeal- Honey Cup -- Pharyngeal -- Pharyngeal- Nectar Teaspoon WFL Pharyngeal -- Pharyngeal- Nectar Cup -- Pharyngeal -- Pharyngeal- Nectar Straw -- Pharyngeal -- Pharyngeal- Thin Teaspoon WFL Pharyngeal -- Pharyngeal- Thin Cup Penetration/Aspiration during swallow;Pharyngeal residue - valleculae Pharyngeal Material enters airway, remains ABOVE vocal cords and not ejected out Pharyngeal- Thin Straw Reduced laryngeal elevation;Significant aspiration (Amount) Pharyngeal -- Pharyngeal- Puree Reduced epiglottic inversion;Reduced tongue base retraction;Pharyngeal residue - valleculae Pharyngeal -- Pharyngeal- Mechanical Soft -- Pharyngeal -- Pharyngeal- Regular Reduced epiglottic inversion;Reduced tongue base retraction;Pharyngeal residue - valleculae Pharyngeal -- Pharyngeal- Multi-consistency -- Pharyngeal -- Pharyngeal- Pill Reduced epiglottic inversion;Reduced tongue base retraction;Pharyngeal residue - valleculae Pharyngeal -- Pharyngeal Comment --  CHL IP CERVICAL ESOPHAGEAL PHASE 06/21/2016 Cervical Esophageal Phase WFL Pudding Teaspoon -- Pudding Cup -- Honey Teaspoon -- Honey Cup -- Nectar Teaspoon -- Nectar Cup -- Nectar Straw -- Thin Teaspoon -- Thin Cup -- Thin Straw -- Puree -- Mechanical Soft -- Regular -- Multi-consistency -- Pill -- Cervical Esophageal Comment -- Luanna Salk, MS [facility] SLP [phone]               Assessment & Plan:   Keanan was seen today for hypertension and atrial fibrillation.  Diagnoses and all orders for this visit:  Benign essential HTN- his BP is well controlled  PAF (paroxysmal atrial fibrillation) ([place])- he has good R/R control, will continue anticoagulation   I am having Mr. Stratton maintain his Vitamin B-12, CALCIUM PO, DSS, aspirin, loratadine, montelukast, budesonide-formoterol, apixaban, mometasone-formoterol, finasteride, flecainide, furosemide, pantoprazole, risperiDONE,  rOPINIRole, sertraline, traMADol, atorvastatin, potassium chloride SA, fluticasone furoate-vilanterol, and multivitamins with iron.  Meds ordered this encounter  Medications  . Multiple Vitamins-Iron (MULTIVITAMINS WITH IRON) TABS tablet    Sig: Take 1 tablet by mouth daily.     Follow-up: Return in about 6 months (around 01/16/2017).  [name], MD

## 2016-07-17 NOTE — Patient Instructions (Signed)

## 2016-07-17 NOTE — Progress Notes (Signed)
Pre visit review using our clinic review tool, if applicable. No additional management support is needed unless otherwise documented below in the visit note. 

## 2016-07-19 ENCOUNTER — Encounter: Payer: Self-pay | Admitting: Physical Therapy

## 2016-07-19 ENCOUNTER — Ambulatory Visit: Payer: Medicare Other | Admitting: Physical Therapy

## 2016-07-19 VITALS — BP 122/64 | HR 54

## 2016-07-19 DIAGNOSIS — M6281 Muscle weakness (generalized): Secondary | ICD-10-CM | POA: Diagnosis not present

## 2016-07-19 DIAGNOSIS — R2681 Unsteadiness on feet: Secondary | ICD-10-CM

## 2016-07-19 DIAGNOSIS — R2689 Other abnormalities of gait and mobility: Secondary | ICD-10-CM

## 2016-07-19 DIAGNOSIS — I69354 Hemiplegia and hemiparesis following cerebral infarction affecting left non-dominant side: Secondary | ICD-10-CM

## 2016-07-19 NOTE — Patient Instructions (Addendum)
Head Motion Side to Side    With feet flat on floor, hands resting on lap, move head to right and left with eyes closed 10 times each way.  Repeat _3___ times per session. Do _1-2_ sessions per day.  Copyright  VHI. All rights reserved.    Head Motion Up / Down    With feet flat on floor, hands resting on lap, move head up and down with eyes closed x 10 reps each way. Repeat __3__ times per session. Do _1-2_ sessions per day.  Copyright  VHI. All rights reserved.   With standing, go slow. Have a visual target when standing and sitting. Use RW everytime and stand there a few moments to ensure you have your balance before walking.

## 2016-07-19 NOTE — Therapy (Signed)
[facility] [address] [place], [place], [address] Phone: [phone]   Fax:  [phone]  Physical Therapy Treatment  Patient Details  Name: [name] MRN: [id number] Date of Birth: 1946-01-29 Referring Provider: Dr. Alysia Penna  Encounter Date: 07/19/2016      PT End of Session - 07/19/16 1328    Visit Number 2   Number of Visits 16   Date for PT Re-Evaluation 09/07/16   Authorization Type Medicare-G Codes and Progress Notes   PT Start Time 1318   PT Stop Time 1400   PT Time Calculation (min) 42 min   Equipment Utilized During Treatment Gait belt   Activity Tolerance Patient tolerated treatment well   Behavior During Therapy [name] for tasks assessed/performed      Past Medical History:  Diagnosis Date  . Anemia 07/29/2013  . Asthma    childhood asthma  . Benign prostatic hypertrophy    several prostate biopsies neg for cancer.   . Bilateral hip pain   . Cancer ([place])   . CHF (congestive heart failure) ([place])   . Complication of anesthesia    MORPHINE CAUSES HALLUCINATIONS  . CVA (cerebral vascular accident) ([place]) 06/2016  . Depression   . FH: colonic polyps 2010  . Gout   . History of kidney stones   . Hypertension   . Kidney cysts   . Morbid obesity ([place])   . Numbness    feet / legs  . PAF (paroxysmal atrial fibrillation) with RVR 07/29/2013  . Sleep apnea    USES C PAP    Past Surgical History:  Procedure Laterality Date  . ARTHROSCOPY KNEE W/ DRILLING    . BREATH TEK H PYLORI N/A 12/23/2012   Procedure: BREATH TEK H PYLORI;  Surgeon: Gayland Curry, MD;  Location: [facility];  Service: General;  Laterality: N/A;  . CHOLECYSTECTOMY  1989  . COLONOSCOPY N/A 07/31/2013   Procedure: COLONOSCOPY;  Surgeon: Gatha Mayer, MD;  Location: [facility];  Service: Endoscopy;  Laterality: N/A;  . ESOPHAGOGASTRODUODENOSCOPY N/A 07/22/2013   Procedure: ESOPHAGOGASTRODUODENOSCOPY (EGD);   Surgeon: Irene Shipper, MD;  [facility];  Service: Endoscopy;  Laterality: N/A;  . ESOPHAGOGASTRODUODENOSCOPY N/A 07/31/2013   Procedure: ESOPHAGOGASTRODUODENOSCOPY (EGD);  Surgeon: Gatha Mayer, MD;  [facility] END[facility];  Service: Endoscopy;  Laterality: N/A;  . INTRAOPERATIVE TRANSESOPHAGEAL ECHOCARDIOGRAM N/A 07/18/2013   Procedure: INTRAOPERATIVE TRANSESOPHAGEAL ECHOCARDIOGRAM;  Surgeon: Grace Isaac, MD;  Location: [facility];  Service: Open Heart Surgery;  Laterality: N/A;  . LAPAROSCOPIC GASTRIC BANDING WITH HIATAL HERNIA REPAIR N/A 04/08/2013   Procedure: LAPAROSCOPIC GASTRIC BANDING WITH possible  HIATAL HERNIA REPAIR;  Surgeon: Gayland Curry, MD;  [facility];  Service: General;  Laterality: N/A;  . LITHOTRIPSY    . renal calculi    . SUBXYPHOID PERICARDIAL WINDOW N/A 07/18/2013   Procedure: SUBXYPHOID PERICARDIAL WINDOW;  Surgeon: Grace Isaac, MD;  Location: [facility];  Service: Thoracic;  Laterality: N/A;  . TONSILLECTOMY    . total knee replacment     bilat  . trigger finger repair     also lue, cts surgically repaired  . WISDOM TOOTH EXTRACTION      Vitals:   07/19/16 1325  BP: 122/64  Pulse: (!) 54        Subjective Assessment - 07/19/16 1325    Subjective Reports a fall to the floor this am. Reports he stood up and felt dizzy/unsteady and fell towards  his left to the floor. Was able to get himself up with use of bed. Denies any injuries.    Patient is accompained by: Family member  spouse, [name]   Pertinent History asthma, basal cell carcinoma, obesity, gout, depression, HTN, OSA, peripheral neuropathy, CHF, PAF, PE, bil TKA   Limitations Walking   Patient Stated Goals improve walking, balance, wants to drive again and be independent   Currently in Pain? No/denies   Pain Score 0-No pain            [facility] PT Assessment - 07/19/16 1331      Standardized Balance Assessment   Standardized Balance Assessment Berg Balance Test     Berg  Balance Test   Sit to Stand Able to stand  independently using hands   Standing Unsupported Able to stand 2 minutes with supervision   Sitting with Back Unsupported but Feet Supported on Floor or Stool Able to sit safely and securely 2 minutes   Stand to Sit Uses backs of legs against chair to control descent   Transfers Needs one person to assist   Standing Unsupported with Eyes Closed Able to stand 3 seconds   Standing Ubsupported with Feet Together Needs help to attain position but able to stand for 30 seconds with feet together   From Standing, Reach Forward with Outstretched Arm Can reach forward >5 cm safely (2")  4 inches safely   From Standing Position, Pick up Object from Floor Unable to pick up and needs supervision   From Standing Position, Turn to Look Behind Over each Shoulder Needs supervision when turning   Turn 360 Degrees Needs assistance while turning   Standing Unsupported, Alternately Place Feet on Step/Stool Needs assistance to keep from falling or unable to try   Standing Unsupported, One Foot in Front Able to take small step independently and hold 30 seconds   Standing on One Leg Unable to try or needs assist to prevent fall   Total Score 22   Berg comment: 22/56= high risk for falls (close to 100%)      added the following to pt's HEP:   Head Motion Side to Side    With feet flat on floor, hands resting on lap, move head to right and left with eyes closed 10 times each way.  Repeat _3___ times per session. Do _1-2_ sessions per day.  Copyright  VHI. All rights reserved.    Head Motion Up / Down    With feet flat on floor, hands resting on lap, move head up and down with eyes closed x 10 reps each way. Repeat __3__ times per session. Do _1-2_ sessions per day.  Copyright  VHI. All rights reserved.   With standing, go slow. Have a visual target when standing and sitting. Use RW everytime and stand there a few moments to ensure you have your balance  before walking.           PT Education - 07/19/16 1506    Education provided Yes   Education Details results of Berg balance test, initial HEP   Person(s) Educated Patient;Spouse   Methods Explanation;Demonstration;Verbal cues;Handout   Comprehension Verbalized understanding;Returned demonstration;Verbal cues required;Need further instruction          PT Short Term Goals - 07/13/16 1506      PT SHORT TERM GOAL #1   Title verbalize understanding of CVA risk factors/warning signs (07/11/16)   Time 4   Period Weeks   Status New  PT SHORT TERM GOAL #2   Title improve timed up and go to < 30 sec for improved mobility (08/10/16)   Time 4   Period Weeks   Status New     PT SHORT TERM GOAL #3   Title improve gait velocity to > 1.32 ft/sec for improved mobility and community access (08/10/16)   Time 4   Period Weeks   Status New     PT SHORT TERM GOAL #4   Title amb > 300' on level indoor/paved outdoor surfaces with LRAD with supervision for improved community access (08/10/16)   Time 4   Period Weeks   Status New     PT SHORT TERM GOAL #5   Title perform BERG with appropriate LTG to be written (08/10/16)   Time 4   Period Weeks   Status New           PT Long Term Goals - 07/13/16 1508      PT LONG TERM GOAL #1   Title independent with HEP (09/07/16)   Time 8   Period Weeks   Status New     PT LONG TERM GOAL #2   Title improve timed up and go to < 21 sec with LRAD for improved mobility (09/07/16)   Time 8   Period Weeks   Status New     PT LONG TERM GOAL #3   Title improve gait velocity to > 1.8 ft/sec for improved mobility and decreased fall risk (09/07/16)   Time 8   Period Weeks   Status New     PT LONG TERM GOAL #4   Title amb > 500' on various indoor/outdoor surfaces with LRAD modified independent for improved independence and mobility (09/07/16)   Time 8   Period Weeks   Status New               Plan - 07/19/16 1330    Clinical Impression  Statement Today's skilled session established baseline value for Berg Balance test for PT to set goals. Remainder established HEP to address dizziness/balance. Pt should benefit from continued PT to progress toward unmet goals   Rehab Potential Good   Clinical Impairments Affecting Rehab Potential multiple comorbidities   PT Frequency 2x / week   PT Duration 8 weeks   PT Treatment/Interventions ADLs/Self Care Home Management;Cryotherapy;Electrical Stimulation;Moist Heat;Neuromuscular re-education;Balance training;Therapeutic exercise;Therapeutic activities;Functional mobility training;Gait training;Stair training;Patient/family education;Orthotic Fit/Training;Manual techniques;Vestibular;Energy conservation   PT Next Visit Plan add standing to HEP for balance and strengthening as able, gait with RW   Consulted and Agree with Plan of Care Patient;Family member/caregiver   Family Member Consulted spouse      Patient will benefit from skilled therapeutic intervention in order to improve the following deficits and impairments:  Abnormal gait, Decreased balance, Decreased activity tolerance, Decreased strength, Postural dysfunction, Obesity, Decreased endurance, Decreased knowledge of use of DME, Decreased mobility  Visit Diagnosis: Unsteadiness on feet  Hemiplegia and hemiparesis following cerebral infarction affecting left non-dominant side (HCC)  Other abnormalities of gait and mobility  Muscle weakness (generalized)     Problem List Patient Active Problem List   Diagnosis Date Noted  . Benign essential HTN   . 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 Embolic cerebral infarction ([place]) 06/21/2016  . Stroke syndrome ([place])   . OSA (obstructive sleep apnea)   . Dysphagia, post-stroke   . Ataxia, post-stroke   . Neurologic gait disorder   . Encounter for monitoring flecainide therapy 04/28/2016  . Pure hyperglyceridemia 04/26/2016  .  Recurrent pulmonary emboli ([place]) 06/13/2015  . GERD  (gastroesophageal reflux disease) 06/13/2015  . Asthma 06/13/2015  . Basal cell carcinoma of skin 06/28/2014  . 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 Sinus bradycardia 12/15/2013  . PAF (paroxysmal atrial fibrillation) ([place]) 07/29/2013  . Chronic anticoagulation 07/22/2013  . Gastric ulcer with hemorrhage 07/20/13 07/22/2013  . Diastolic congestive heart failure ([place]) 07/16/2013  . H/O laparoscopic adjustable gastric banding & hiatal hernia repair 04/08/13 04/23/2013  . Prediabetes 02/05/2013  . TMJ (temporomandibular joint syndrome) 09/11/2012  . Morbid obesity ([place]) 08/09/2012  . Peripheral neuropathy ([place]) 11/02/2009  . Hyperlipidemia with target LDL less than 130 09/10/2008  . Depression 09/10/2008  . GOUT 06/28/2006  . BPH (benign prostatic hyperplasia) 06/28/2006    Willow Ora, PTA, [facility] [address], [place], [place] [address] [phone] 07/19/16, 3:07 PM   Name: [name] MRN: [id number] Date of Birth: December 12, 1945

## 2016-07-21 ENCOUNTER — Encounter: Payer: Self-pay | Admitting: Physical Therapy

## 2016-07-21 ENCOUNTER — Ambulatory Visit: Payer: Medicare Other | Admitting: Physical Therapy

## 2016-07-21 DIAGNOSIS — M6281 Muscle weakness (generalized): Secondary | ICD-10-CM

## 2016-07-21 DIAGNOSIS — R2689 Other abnormalities of gait and mobility: Secondary | ICD-10-CM | POA: Diagnosis not present

## 2016-07-21 DIAGNOSIS — I69354 Hemiplegia and hemiparesis following cerebral infarction affecting left non-dominant side: Secondary | ICD-10-CM | POA: Diagnosis not present

## 2016-07-21 DIAGNOSIS — R2681 Unsteadiness on feet: Secondary | ICD-10-CM | POA: Diagnosis not present

## 2016-07-21 NOTE — Patient Instructions (Addendum)
Holding onto counter top for balance:  Heel Raises    Stand with support. With tall posture, raise your heels up, hold this for 5 seconds, then slowly lower heels down.  Repeat _10__ times. Do _1-2_ times a day.  Copyright  VHI. All rights reserved.    Hip Side Kick    Holding for balance, keep legs shoulder width apart and toes pointed forward. Swing a leg out to side, keeping knee straight. Do not lean. Repeat using other leg. Repeat _10_ times each leg, alternating legs. Do __1-2__ sessions per day.  http://gt2.exer.us/342   Copyright  VHI. All rights reserved.   Side-Stepping    Holding for support: step sideways along counter top to one direction and then back toward the other direction. Repeat 3 laps each way.  Do __1-2 sessions per day.   Copyright  VHI. All rights reserved.    "I love a Chiropodist with right hand and spouse/family member on the left. High knee marching forward along counter top and then WALK backwards to starting point.  Repeat 2-3 laps each way. Do _1-2_ sessions per day.  http://gt2.exer.us/344   Copyright  VHI. All rights reserved.

## 2016-07-21 NOTE — Therapy (Signed)
[name], [name], [name] Phone: [phone]   Fax:  [phone]  Physical Therapy Treatment  Patient Details  Name: [name] MRN: [id number] Date of Birth: 08/10/1945 Referring Provider: Dr. Alysia Penna  Encounter Date: 07/21/2016      PT End of Session - 07/21/16 1507    Visit Number 3   Number of Visits 16   Date for PT Re-Evaluation 09/07/16   Authorization Type Medicare-G Codes and Progress Notes   PT Start Time 1505  pt late for appt   PT Stop Time 1537   PT Time Calculation (min) 32 min   Equipment Utilized During Treatment Gait belt   Activity Tolerance Patient tolerated treatment well   Behavior During Therapy WFL for tasks assessed/performed      Past Medical History:  Diagnosis Date  . Anemia 07/29/2013  . Asthma    childhood asthma  . Benign prostatic hypertrophy    several prostate biopsies neg for cancer.   . Bilateral hip pain   . Cancer ([place])   . CHF (congestive heart failure) ([place])   . Complication of anesthesia    MORPHINE CAUSES HALLUCINATIONS  . CVA (cerebral vascular accident) ([place]) 06/2016  . Depression   . FH: colonic polyps 2010  . Gout   . History of kidney stones   . Hypertension   . Kidney cysts   . Morbid obesity ([place])   . Numbness    feet / legs  . PAF (paroxysmal atrial fibrillation) with RVR 07/29/2013  . Sleep apnea    USES C PAP    Past Surgical History:  Procedure Laterality Date  . ARTHROSCOPY KNEE W/ DRILLING    . BREATH TEK H PYLORI N/A 12/23/2012   Procedure: BREATH TEK H PYLORI;  Surgeon: Gayland Curry, MD;  Location: [facility];  Service: General;  Laterality: N/A;  . CHOLECYSTECTOMY  1989  . COLONOSCOPY N/A 07/31/2013   Procedure: COLONOSCOPY;  Surgeon: Gatha Mayer, MD;  Location: [facility];  Service: Endoscopy;  Laterality: N/A;  . ESOPHAGOGASTRODUODENOSCOPY N/A 07/22/2013   Procedure:  ESOPHAGOGASTRODUODENOSCOPY (EGD);  Surgeon: Irene Shipper, MD;  Location: [facility] ENDOSCOPY;  Service: Endoscopy;  Laterality: N/A;  . ESOPHAGOGASTRODUODENOSCOPY N/A 07/31/2013   Procedure: ESOPHAGOGASTRODUODENOSCOPY (EGD);  Surgeon: Gatha Mayer, MD;  Location: [facility] ENDOSCOPY;  Service: Endoscopy;  Laterality: N/A;  . INTRAOPERATIVE TRANSESOPHAGEAL ECHOCARDIOGRAM N/A 07/18/2013   Procedure: INTRAOPERATIVE TRANSESOPHAGEAL ECHOCARDIOGRAM;  Surgeon: Grace Isaac, MD;  Location: [facility];  Service: Open Heart Surgery;  Laterality: N/A;  . LAPAROSCOPIC GASTRIC BANDING WITH HIATAL HERNIA REPAIR N/A 04/08/2013   Procedure: LAPAROSCOPIC GASTRIC BANDING WITH possible  HIATAL HERNIA REPAIR;  Surgeon: Gayland Curry, MD;  [facility];  Service: General;  Laterality: N/A;  . LITHOTRIPSY    . renal calculi    . SUBXYPHOID PERICARDIAL WINDOW N/A 07/18/2013   Procedure: SUBXYPHOID PERICARDIAL WINDOW;  Surgeon: Grace Isaac, MD;  Location: [facility];  Service: Thoracic;  Laterality: N/A;  . TONSILLECTOMY    . total knee replacment     bilat  . trigger finger repair     also lue, cts surgically repaired  . WISDOM TOOTH EXTRACTION      There were no vitals filed for this visit.      Subjective Assessment - 07/21/16 1506    Subjective No new falls. No pain. Getting less woozy with seated HEP.   Patient is accompained by: Family member  spouse, [name]   Limitations Walking   Patient Stated Goals improve walking, balance, wants to drive again and be independent   Currently in Pain? No/denies   Pain Score 0-No pain           [facility] Adult PT Treatment/Exercise - 07/21/16 1508      Transfers   Transfers Sit to Stand;Stand to Sit   Sit to Stand 5: Supervision;With upper extremity assist;From chair/3-in-1;From bed   Sit to Stand Details Verbal cues for sequencing;Verbal cues for technique;Verbal cues for safe use of DME/AE;Manual facilitation for placement;Manual facilitation for weight shifting   Sit to  Stand Details (indicate cue type and reason) cues for hand placement for safety with transfers, cues for equal LE weight bearing/midline posture   Stand to Sit 5: Supervision;With upper extremity assist;To chair/3-in-1;To bed   Stand to Sit Details (indicate cue type and reason) Verbal cues for precautions/safety;Verbal cues for technique;Manual facilitation for weight shifting;Verbal cues for safe use of DME/AE   Stand to Sit Details cues for use of arms to control descent with sitting down     Ambulation/Gait   Ambulation/Gait Yes   Ambulation/Gait Assistance 4: Min guard   Ambulation Distance (Feet) 50 Feet   Assistive device Rolling walker   Gait Pattern Trunk flexed;Poor foot clearance - left;Step-through pattern;Decreased stride length;Decreased step length - right;Decreased step length - left;Narrow base of support   Ambulation Surface Level;Indoor     added for following to pt's HEP: Holding onto counter top for balance:  Heel Raises    Stand with support. With tall posture, raise your heels up, hold this for 5 seconds, then slowly lower heels down.  Repeat _10__ times. Do _1-2_ times a day.  Copyright  VHI. All rights reserved.    Hip Side Kick    Holding for balance, keep legs shoulder width apart and toes pointed forward. Swing a leg out to side, keeping knee straight. Do not lean. Repeat using other leg. Repeat _10_ times each leg, alternating legs. Do __1-2__ sessions per day.  http://gt2.exer.us/342   Copyright  VHI. All rights reserved.   Side-Stepping    Holding for support: step sideways along counter top to one direction and then back toward the other direction. Repeat 3 laps each way.  Do __1-2 sessions per day.   Copyright  VHI. All rights reserved.    "I love a Chiropodist with right hand and spouse/family member on the left. High knee marching forward along counter top and then WALK backwards to starting point.  Repeat  2-3 laps each way. Do _1-2_ sessions per day.  http://gt2.exer.us/344   Copyright  VHI. All rights reserved.         PT Education - 07/21/16 1546    Education provided Yes   Education Details standing ex's at counter top   Person(s) Educated Patient;Spouse   Methods Explanation;Demonstration;Verbal cues;Handout   Comprehension Verbalized understanding;Returned demonstration;Verbal cues required;Need further instruction          PT Short Term Goals - 07/13/16 1506      PT SHORT TERM GOAL #1   Title verbalize understanding of CVA risk factors/warning signs (07/11/16)   Time 4   Period Weeks   Status New     PT SHORT TERM GOAL #2   Title improve timed up and go to < 30 sec for improved mobility (08/10/16)   Time 4   Period Weeks   Status New  PT SHORT TERM GOAL #3   Title improve gait velocity to > 1.32 ft/sec for improved mobility and community access (08/10/16)   Time 4   Period Weeks   Status New     PT SHORT TERM GOAL #4   Title amb > 300' on level indoor/paved outdoor surfaces with LRAD with supervision for improved community access (08/10/16)   Time 4   Period Weeks   Status New     PT SHORT TERM GOAL #5   Title perform BERG with appropriate LTG to be written (08/10/16)   Time 4   Period Weeks   Status New           PT Long Term Goals - 07/13/16 1508      PT LONG TERM GOAL #1   Title independent with HEP (09/07/16)   Time 8   Period Weeks   Status New     PT LONG TERM GOAL #2   Title improve timed up and go to < 21 sec with LRAD for improved mobility (09/07/16)   Time 8   Period Weeks   Status New     PT LONG TERM GOAL #3   Title improve gait velocity to > 1.8 ft/sec for improved mobility and decreased fall risk (09/07/16)   Time 8   Period Weeks   Status New     PT LONG TERM GOAL #4   Title amb > 500' on various indoor/outdoor surfaces with LRAD modified independent for improved independence and mobility (09/07/16)   Time 8   Period Weeks    Status New            Plan - 07/21/16 1507    Clinical Impression Statement Today's skilled session advanced pt's HEP with standing at counter top activities to address strengthening and balance. Pt able to perform in session without issues. Pt is making steady progress toward goals and should benefit from continued PT to progress toward unmet goals.    Rehab Potential Good   Clinical Impairments Affecting Rehab Potential multiple comorbidities   PT Frequency 2x / week   PT Duration 8 weeks   PT Treatment/Interventions ADLs/Self Care Home Management;Cryotherapy;Electrical Stimulation;Moist Heat;Neuromuscular re-education;Balance training;Therapeutic exercise;Therapeutic activities;Functional mobility training;Gait training;Stair training;Patient/family education;Orthotic Fit/Training;Manual techniques;Vestibular;Energy conservation   PT Next Visit Plan gait with RW, balance activities with decr visual imput, on compliant surfaces and single leg stance (watch left ankle, it rolls if he does not have brace on)   Consulted and Agree with Plan of Care Patient;Family member/caregiver   Family Member Consulted spouse      Patient will benefit from skilled therapeutic intervention in order to improve the following deficits and impairments:  Abnormal gait, Decreased balance, Decreased activity tolerance, Decreased strength, Postural dysfunction, Obesity, Decreased endurance, Decreased knowledge of use of DME, Decreased mobility  Visit Diagnosis: Unsteadiness on feet  Hemiplegia and hemiparesis following cerebral infarction affecting left non-dominant side (HCC)  Other abnormalities of gait and mobility  Muscle weakness (generalized)     Problem List Patient Active Problem List   Diagnosis Date Noted  . Benign essential HTN   . 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 Embolic cerebral infarction ([place]) 06/21/2016  . Stroke syndrome ([place])   . OSA (obstructive sleep apnea)   . Dysphagia,  post-stroke   . Ataxia, post-stroke   . Neurologic gait disorder   . Encounter for monitoring flecainide therapy 04/28/2016  . Pure hyperglyceridemia 04/26/2016  . Recurrent pulmonary emboli ([place]) 06/13/2015  . GERD (gastroesophageal reflux  disease) 06/13/2015  . Asthma 06/13/2015  . Basal cell carcinoma of skin 06/28/2014  . 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 Sinus bradycardia 12/15/2013  . PAF (paroxysmal atrial fibrillation) ([place]) 07/29/2013  . Chronic anticoagulation 07/22/2013  . Gastric ulcer with hemorrhage 07/20/13 07/22/2013  . Diastolic congestive heart failure ([place]) 07/16/2013  . H/O laparoscopic adjustable gastric banding & hiatal hernia repair 04/08/13 04/23/2013  . Prediabetes 02/05/2013  . TMJ (temporomandibular joint syndrome) 09/11/2012  . Morbid obesity ([place]) 08/09/2012  . Peripheral neuropathy ([place]) 11/02/2009  . Hyperlipidemia with target LDL less than 130 09/10/2008  . Depression 09/10/2008  . GOUT 06/28/2006  . BPH (benign prostatic hyperplasia) 06/28/2006    Willow Ora, PTA, [facility] [address] [place],  [address] [phone] 07/21/16, 3:49 PM   Name: [name] MRN: [id number] Date of Birth: Aug 01, 1945

## 2016-07-25 ENCOUNTER — Encounter: Payer: Self-pay | Admitting: Physical Therapy

## 2016-07-25 ENCOUNTER — Ambulatory Visit: Payer: Medicare Other | Admitting: Physical Therapy

## 2016-07-25 ENCOUNTER — Inpatient Hospital Stay: Payer: Medicare Other | Admitting: Physical Medicine & Rehabilitation

## 2016-07-25 ENCOUNTER — Ambulatory Visit: Payer: Self-pay

## 2016-07-25 DIAGNOSIS — R2681 Unsteadiness on feet: Secondary | ICD-10-CM

## 2016-07-25 DIAGNOSIS — M6281 Muscle weakness (generalized): Secondary | ICD-10-CM | POA: Diagnosis not present

## 2016-07-25 DIAGNOSIS — I69354 Hemiplegia and hemiparesis following cerebral infarction affecting left non-dominant side: Secondary | ICD-10-CM | POA: Diagnosis not present

## 2016-07-25 DIAGNOSIS — R2689 Other abnormalities of gait and mobility: Secondary | ICD-10-CM | POA: Diagnosis not present

## 2016-07-25 NOTE — Therapy (Signed)
[facility] [address] [place], [place], [address] Phone: [phone]   Fax:  [phone]  Physical Therapy Treatment  Patient Details  Name: [name] MRN: [id number] Date of Birth: 01-18-1946 Referring Provider: Dr. Alysia Penna  Encounter Date: 07/25/2016      PT End of Session - 07/25/16 1407    Visit Number 4   Number of Visits 16   Date for PT Re-Evaluation 09/07/16   Authorization Type Medicare-G Codes and Progress Notes   PT Start Time 8786   PT Stop Time 1445   PT Time Calculation (min) 40 min   Equipment Utilized During Treatment Gait belt   Activity Tolerance Patient tolerated treatment well   Behavior During Therapy [facility] for tasks assessed/performed      Past Medical History:  Diagnosis Date  . Anemia 07/29/2013  . Asthma    childhood asthma  . Benign prostatic hypertrophy    several prostate biopsies neg for cancer.   . Bilateral hip pain   . Cancer ([place])   . CHF (congestive heart failure) ([place])   . Complication of anesthesia    MORPHINE CAUSES HALLUCINATIONS  . CVA (cerebral vascular accident) ([place]) 06/2016  . Depression   . FH: colonic polyps 2010  . Gout   . History of kidney stones   . Hypertension   . Kidney cysts   . Morbid obesity ([place])   . Numbness    feet / legs  . PAF (paroxysmal atrial fibrillation) with RVR 07/29/2013  . Sleep apnea    USES C PAP    Past Surgical History:  Procedure Laterality Date  . ARTHROSCOPY KNEE W/ DRILLING    . BREATH TEK H PYLORI N/A 12/23/2012   Procedure: BREATH TEK H PYLORI;  Surgeon: Gayland Curry, MD;  Location: [facility];  Service: General;  Laterality: N/A;  . CHOLECYSTECTOMY  1989  . COLONOSCOPY N/A 07/31/2013   Procedure: COLONOSCOPY;  Surgeon: Gatha Mayer, MD;  Location: [facility];  Service: Endoscopy;  Laterality: N/A;  . ESOPHAGOGASTRODUODENOSCOPY N/A 07/22/2013   Procedure: ESOPHAGOGASTRODUODENOSCOPY (EGD);   Surgeon: Irene Shipper, MD;  Location: [facility]COPY;  Service: Endoscopy;  Laterality: N/A;  . ESOPHAGOGASTRODUODENOSCOPY N/A 07/31/2013   Procedure: ESOPHAGOGASTRODUODENOSCOPY (EGD);  Surgeon: Gatha Mayer, MD;  Location: [facility] ENDOSCOPY;  Service: Endoscopy;  Laterality: N/A;  . INTRAOPERATIVE TRANSESOPHAGEAL ECHOCARDIOGRAM N/A 07/18/2013   Procedure: INTRAOPERATIVE TRANSESOPHAGEAL ECHOCARDIOGRAM;  Surgeon: Grace Isaac, MD;  Location: [facility];  Service: Open Heart Surgery;  Laterality: N/A;  . LAPAROSCOPIC GASTRIC BANDING WITH HIATAL HERNIA REPAIR N/A 04/08/2013   Procedure: LAPAROSCOPIC GASTRIC BANDING WITH possible  HIATAL HERNIA REPAIR;  Surgeon: Gayland Curry, MD;  Location: [facility];  Service: General;  Laterality: N/A;  . LITHOTRIPSY    . renal calculi    . SUBXYPHOID PERICARDIAL WINDOW N/A 07/18/2013   Procedure: SUBXYPHOID PERICARDIAL WINDOW;  Surgeon: Grace Isaac, MD;  Location: [facility];  Service: Thoracic;  Laterality: N/A;  . TONSILLECTOMY    . total knee replacment     bilat  . trigger finger repair     also lue, cts surgically repaired  . WISDOM TOOTH EXTRACTION      There were no vitals filed for this visit.      Subjective Assessment - 07/25/16 1407    Subjective No new falls or pain to report. Does report his vision is worse today than usual.    Patient is accompained by: Family  member  spouse, [name]   Pertinent History asthma, basal cell carcinoma, obesity, gout, depression, HTN, OSA, peripheral neuropathy, CHF, PAF, PE, bil TKA   Patient Stated Goals improve walking, balance, wants to drive again and be independent   Currently in Pain? No/denies   Pain Score 0-No pain           [facility] Adult PT Treatment/Exercise - 07/25/16 1420      Transfers   Transfers Sit to Stand;Stand to Sit   Sit to Stand 5: Supervision;With upper extremity assist;From chair/3-in-1;From bed   Stand to Sit 5: Supervision;With upper extremity assist;To chair/3-in-1;To bed      Ambulation/Gait   Ambulation/Gait Yes   Ambulation/Gait Assistance 4: Min guard   Ambulation/Gait Assistance Details cues on posture, for increased step length and for increased weight bearing on legs (decr UE reliance on RW)                         Ambulation Distance (Feet) 115 Feet   Assistive device Rolling walker   Gait Pattern Trunk flexed;Poor foot clearance - left;Step-through pattern;Decreased stride length;Decreased step length - right;Decreased step length - left;Narrow base of support  heavy UE reliance on RW with gait   Ambulation Surface Level;Indoor   Gait Comments has his left ASO on today with improved ankle stability noted           Balance Exercises - 07/25/16 1434      Balance Exercises: Standing   Standing Eyes Closed Narrow base of support (BOS);Solid surface;Head turns;Other reps (comment);20 secs;Limitations   SLS with Vectors Solid surface;Intermittent upper extremity assist;Limitations     Balance Exercises: Standing   Standing Eyes Closed Limitations on floor with feet together: EC no head movements, progressing to EC head movements left<>right and up<>down with min assist.  cues on posture and weight shifting to assist with balance.                                         SLS with Vectors Limitations 2 foam bubbles on floor: with UE support alteranting fwd toe taps x 10 reps each leg, progressed to no UE support for fwd toe taps x 10 reps each leg and cross toe taps x 10 reps each leg, min to mod assist with cues on posture and weight shifting.              PT Short Term Goals - 07/23/16 1458      PT SHORT TERM GOAL #1   Title verbalize understanding of CVA risk factors/warning signs (07/11/16)   Time 4   Period Weeks   Status On-going     PT SHORT TERM GOAL #2   Title improve timed up and go to < 30 sec for improved mobility (08/10/16)   Time 4   Period Weeks   Status On-going     PT SHORT TERM GOAL #3   Title improve gait velocity to > 1.32  ft/sec for improved mobility and community access (08/10/16)   Time 4   Period Weeks   Status On-going     PT SHORT TERM GOAL #4   Title amb > 300' on level indoor/paved outdoor surfaces with LRAD with supervision for improved community access (08/10/16)   Time 4   Period Weeks   Status On-going     PT SHORT TERM GOAL #5  Title perform BERG with appropriate LTG to be written (08/10/16)   Baseline 22/56 on 07/19/16   Time 4   Period Weeks   Status Achieved           PT Long Term Goals - 07/23/16 1458      PT LONG TERM GOAL #1   Title independent with HEP (09/07/16)   Time 8   Period Weeks   Status On-going     PT LONG TERM GOAL #2   Title improve timed up and go to < 21 sec with LRAD for improved mobility (09/07/16)   Time 8   Period Weeks   Status On-going     PT LONG TERM GOAL #3   Title improve gait velocity to > 1.8 ft/sec for improved mobility and decreased fall risk (09/07/16)   Time 8   Period Weeks   Status On-going     PT LONG TERM GOAL #4   Title amb > 500' on various indoor/outdoor surfaces with LRAD modified independent for improved independence and mobility (09/07/16)   Time 8   Period Weeks   Status On-going     PT LONG TERM GOAL #5   Title Pt will decrease falls risk as indicated by an increase in BERG balance score to >32/56    Baseline 22/56   Time 8   Period Weeks   Status New           Plan - 07/25/16 1407    Clinical Impression Statement Today's skilled session continued to address balance, gait and strengthening. Pt continues to report blurred/double vision that makes his balance off. Tried taping patch to eye glasses today, howver he reported it was better to just not wear the glasses. Pt is making progress toward goals and should benefit from continued PT to progress toward unmet goals.                                Rehab Potential Good   Clinical Impairments Affecting Rehab Potential multiple comorbidities   PT Frequency 2x / week   PT  Duration 8 weeks   PT Treatment/Interventions ADLs/Self Care Home Management;Cryotherapy;Electrical Stimulation;Moist Heat;Neuromuscular re-education;Balance training;Therapeutic exercise;Therapeutic activities;Functional mobility training;Gait training;Stair training;Patient/family education;Orthotic Fit/Training;Manual techniques;Vestibular;Energy conservation   PT Next Visit Plan gait with RW, balance activities with decr visual imput, on compliant surfaces and single leg stance (watch left ankle, it rolls if he does not have brace on);advance HEP as needed.   Consulted and Agree with Plan of Care Patient;Family member/caregiver   Family Member Consulted spouse      Patient will benefit from skilled therapeutic intervention in order to improve the following deficits and impairments:  Abnormal gait, Decreased balance, Decreased activity tolerance, Decreased strength, Postural dysfunction, Obesity, Decreased endurance, Decreased knowledge of use of DME, Decreased mobility  Visit Diagnosis: Unsteadiness on feet  Hemiplegia and hemiparesis following cerebral infarction affecting left non-dominant side (HCC)  Other abnormalities of gait and mobility  Muscle weakness (generalized)     Problem List Patient Active Problem List   Diagnosis Date Noted  . Benign essential HTN   . 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 Embolic cerebral infarction ([place]) 06/21/2016  . Stroke syndrome ([place])   . OSA (obstructive sleep apnea)   . Dysphagia, post-stroke   . Ataxia, post-stroke   . Neurologic gait disorder   . Encounter for monitoring flecainide therapy 04/28/2016  . Pure hyperglyceridemia 04/26/2016  . Recurrent pulmonary  emboli ([place]) 06/13/2015  . GERD (gastroesophageal reflux disease) 06/13/2015  . Asthma 06/13/2015  . Basal cell carcinoma of skin 06/28/2014  . 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 Sinus bradycardia 12/15/2013  . PAF (paroxysmal atrial fibrillation) ([place])  07/29/2013  . Chronic anticoagulation 07/22/2013  . Gastric ulcer with hemorrhage 07/20/13 07/22/2013  . Diastolic congestive heart failure ([place]) 07/16/2013  . H/O laparoscopic adjustable gastric banding & hiatal hernia repair 04/08/13 04/23/2013  . Prediabetes 02/05/2013  . TMJ (temporomandibular joint syndrome) 09/11/2012  . Morbid obesity ([place]) 08/09/2012  . Peripheral neuropathy ([place]) 11/02/2009  . Hyperlipidemia with target LDL less than 130 09/10/2008  . Depression 09/10/2008  . GOUT 06/28/2006  . BPH (benign prostatic hyperplasia) 06/28/2006    Willow Ora, PTA, [facility] [address] [place], [place] [address] [phone] 07/25/16, 4:44 PM   Name: [name] MRN: [id number] Date of Birth: 1945/12/31

## 2016-07-26 ENCOUNTER — Ambulatory Visit: Payer: Medicare Other | Admitting: Physical Therapy

## 2016-07-26 ENCOUNTER — Telehealth: Payer: Self-pay | Admitting: Physical Therapy

## 2016-07-26 ENCOUNTER — Encounter: Payer: Self-pay | Admitting: Physical Therapy

## 2016-07-26 VITALS — BP 123/73 | HR 58

## 2016-07-26 DIAGNOSIS — R2689 Other abnormalities of gait and mobility: Secondary | ICD-10-CM

## 2016-07-26 DIAGNOSIS — I69354 Hemiplegia and hemiparesis following cerebral infarction affecting left non-dominant side: Secondary | ICD-10-CM

## 2016-07-26 DIAGNOSIS — M6281 Muscle weakness (generalized): Secondary | ICD-10-CM

## 2016-07-26 DIAGNOSIS — R2681 Unsteadiness on feet: Secondary | ICD-10-CM

## 2016-07-26 NOTE — Telephone Encounter (Signed)
Hello again Dr. Letta Pate, I apologize for so many messages today, I saw a lot of your patients today.   When I worked with [name] today he was reporting that his diplopia and dizziness (which is really disequilibrium) was improving since beginning outpatient (so much so that OT has already D/C him) but over the past week he has noticed that it is regressing back to the severity that he was experiencing in the hospital right after his stroke.  It is causing him to have multiple LOB at home and one fall.    Do you feel that he would benefit from a referral to a neuro ophthalmologist to be assessed for prisms?  He was also asking about a hinged knee brace for his LLE for pain and stability.  What are your thoughts on a brace for the L knee?   I believe he has an appointment with you on 4/27 and just wanted to give you a heads up on these two issues before you see him later this week.  Thanks, [name], PT, DPT 07/26/16    7:12 PM

## 2016-07-26 NOTE — Therapy (Addendum)
[facility] [address] [place], [place], [address] Phone: [phone]   Fax:  [phone]  Physical Therapy Treatment  Patient Details  Name: [name] MRN: [id number] Date of Birth: 1945/07/14 Referring Provider: Dr. Alysia Penna  Encounter Date: 07/26/2016      PT End of Session - 07/26/16 1701    Visit Number 5   Number of Visits 16   Date for PT Re-Evaluation 09/07/16   Authorization Type Medicare-G Codes and Progress Notes   PT Start Time 1408   PT Stop Time 1450   PT Time Calculation (min) 42 min   Activity Tolerance Patient tolerated treatment well   Behavior During Therapy [facility] for tasks assessed/performed      Past Medical History:  Diagnosis Date  . Anemia 07/29/2013  . Asthma    childhood asthma  . Benign prostatic hypertrophy    several prostate biopsies neg for cancer.   . Bilateral hip pain   . Cancer ([place])   . CHF (congestive heart failure) ([place])   . Complication of anesthesia    MORPHINE CAUSES HALLUCINATIONS  . CVA (cerebral vascular accident) ([place]) 06/2016  . Depression   . FH: colonic polyps 2010  . Gout   . History of kidney stones   . Hypertension   . Kidney cysts   . Morbid obesity ([place])   . Numbness    feet / legs  . PAF (paroxysmal atrial fibrillation) with RVR 07/29/2013  . Sleep apnea    USES C PAP    Past Surgical History:  Procedure Laterality Date  . ARTHROSCOPY KNEE W/ DRILLING    . BREATH TEK H PYLORI N/A 12/23/2012   Procedure: BREATH TEK H PYLORI;  Surgeon: Gayland Curry, MD;  Location: [facility];  Service: General;  Laterality: N/A;  . CHOLECYSTECTOMY  1989  . COLONOSCOPY N/A 07/31/2013   Procedure: COLONOSCOPY;  Surgeon: Gatha Mayer, MD;  Location: [facility];  Service: Endoscopy;  Laterality: N/A;  . ESOPHAGOGASTRODUODENOSCOPY N/A 07/22/2013   Procedure: ESOPHAGOGASTRODUODENOSCOPY (EGD);  Surgeon: Irene Shipper, MD;  Location[facility];   Service: Endoscopy;  Laterality: N/A;  . ESOPHAGOGASTRODUODENOSCOPY N/A 07/31/2013   Procedure: ESOPHAGOGASTRODUODENOSCOPY (EGD);  Surgeon: Gatha Mayer, MD;  [facility];  Service: Endoscopy;  Laterality: N/A;  . INTRAOPERATIVE TRANSESOPHAGEAL ECHOCARDIOGRAM N/A 07/18/2013   Procedure: INTRAOPERATIVE TRANSESOPHAGEAL ECHOCARDIOGRAM;  Surgeon: Grace Isaac, MD;  Location: [facility];  Service: Open Heart Surgery;  Laterality: N/A;  . LAPAROSCOPIC GASTRIC BANDING WITH HIATAL HERNIA REPAIR N/A 04/08/2013   Procedure: LAPAROSCOPIC GASTRIC BANDING WITH possible  HIATAL HERNIA REPAIR;  Surgeon: Gayland Curry, MD;  Location[facility];  Service: General;  Laterality: N/A;  . LITHOTRIPSY    . renal calculi    . SUBXYPHOID PERICARDIAL WINDOW N/A 07/18/2013   Procedure: SUBXYPHOID PERICARDIAL WINDOW;  Surgeon: Grace Isaac, MD;  Location: [facility];  Service: Thoracic;  Laterality: N/A;  . TONSILLECTOMY    . total knee replacment     bilat  . trigger finger repair     also lue, cts surgically repaired  . WISDOM TOOTH EXTRACTION      Vitals:   07/26/16 1417  BP: 123/73  Pulse: (!) 58        Subjective Assessment - 07/26/16 1418    Subjective Pt reports vision/diplopia continues to regress back to post-CVA severity, also feels that balance and disequilibrium, leaning are worse than before.  Reports fatigue today as  well.   Patient is accompained by: Family member   Pertinent History asthma, basal cell carcinoma, obesity, gout, depression, HTN, OSA, peripheral neuropathy, CHF, PAF, PE, bil TKA   Limitations Walking   Patient Stated Goals improve walking, balance, wants to drive again and be independent   Currently in Pain? No/denies                Vestibular Assessment - 07/26/16 1420      Vestibular Assessment   General Observation sitting in w/c leaning to R     Symptom Behavior   Type of Dizziness Imbalance   Frequency of Dizziness daily when upright   Duration of  Dizziness constant     Occulomotor Exam   Occulomotor Alignment Abnormal   Spontaneous Absent   Gaze-induced Absent   Smooth Pursuits Intact   Saccades Poor trajectory;Slow   Comment Diplopia in binocular vision-worse to R and downward gaze; cover-cross cover test: L eye rests in ADD-corrects with ABD     Vestibulo-Occular Reflex   VOR 1 Head Only (x 1 viewing) impaired   VOR to Slow Head Movement Positive bilaterally   Comment HIT: + to L and R                 [facility] Adult PT Treatment/Exercise - 07/26/16 1655      Ambulation/Gait   Ambulation/Gait Yes   Ambulation/Gait Assistance 4: Min guard   Ambulation/Gait Assistance Details gait forwards and retro with countertop on R side and RUE support on countertop as tactile feedback for full R lateral weight shift and maintaining trunk in midline with gaze stabilized on letter X in front of patient.  Pt reporting letter was bouncing in vision during gait.   Ambulation Distance (Feet) 25 Feet  forwards and retro x 3 reps each   Assistive device Other (Comment)  counter top on R         Vestibular Treatment/Exercise - 07/26/16 1440      Vestibular Treatment/Exercise   Vestibular Treatment Provided Gaze   Gaze Exercises X1 Viewing Horizontal;X1 Viewing Vertical     X1 Viewing Horizontal   Foot Position supported sitting   Reps 1   Comments monocular vision-one eye covered-1 reps with each eye, 30 seconds     X1 Viewing Vertical   Foot Position supported sitting   Reps 1   Comments monocular vision-one eye covered-1 reps with each eye, 30 seconds             PT Education - 07/26/16 1700    Education provided Yes   Education Details possible need for L knee brace and referral to neuro-opthamology due to diplopia, impact of diplopia and impaired visual vertical on balance, gaze stability and x 1 viewing monocular   Person(s) Educated Patient;Spouse   Methods Explanation;Demonstration;Handout   Comprehension  Need further instruction          PT Short Term Goals - 07/23/16 1458      PT SHORT TERM GOAL #1   Title verbalize understanding of CVA risk factors/warning signs (07/11/16)   Time 4   Period Weeks   Status On-going     PT SHORT TERM GOAL #2   Title improve timed up and go to < 30 sec for improved mobility (08/10/16)   Time 4   Period Weeks   Status On-going     PT SHORT TERM GOAL #3   Title improve gait velocity to > 1.32 ft/sec for improved mobility and community access (  08/10/16)   Time 4   Period Weeks   Status On-going     PT SHORT TERM GOAL #4   Title amb > 300' on level indoor/paved outdoor surfaces with LRAD with supervision for improved community access (08/10/16)   Time 4   Period Weeks   Status On-going     PT SHORT TERM GOAL #5   Title perform BERG with appropriate LTG to be written (08/10/16)   Baseline 22/56 on 07/19/16   Time 4   Period Weeks   Status Achieved           PT Long Term Goals - 07/23/16 1458      PT LONG TERM GOAL #1   Title independent with HEP (09/07/16)   Time 8   Period Weeks   Status On-going     PT LONG TERM GOAL #2   Title improve timed up and go to < 21 sec with LRAD for improved mobility (09/07/16)   Time 8   Period Weeks   Status On-going     PT LONG TERM GOAL #3   Title improve gait velocity to > 1.8 ft/sec for improved mobility and decreased fall risk (09/07/16)   Time 8   Period Weeks   Status On-going     PT LONG TERM GOAL #4   Title amb > 500' on various indoor/outdoor surfaces with LRAD modified independent for improved independence and mobility (09/07/16)   Time 8   Period Weeks   Status On-going     PT LONG TERM GOAL #5   Title Pt will decrease falls risk as indicated by an increase in BERG balance score to >32/56    Baseline 22/56   Time 8   Period Weeks   Status New               Plan - 07/26/16 1701    Clinical Impression Statement Treatment session today with focus on vestibular assessment and  visual assessment due to pt reporting diplopia worsening.  Pt presents with impaired gaze stability, impaired VOR bilaterally, diplopia and impaired subjective visual vertical.  Educated pt on referral options and x1 viewing in sitting monocular for gaze stability HEP and incorporated gaze stability into gait training short distances.  Will continue to address.   Rehab Potential Good   Clinical Impairments Affecting Rehab Potential multiple comorbidities   PT Treatment/Interventions ADLs/Self Care Home Management;Cryotherapy;Electrical Stimulation;Moist Heat;Neuromuscular re-education;Balance training;Therapeutic exercise;Therapeutic activities;Functional mobility training;Gait training;Stair training;Patient/family education;Orthotic Fit/Training;Manual techniques;Vestibular;Energy conservation   PT Next Visit Plan give pt handout for x 1 viewing in sitting; review with monocular vision, corrective saccades.  Use of gaze stability during gait and tactile feedback on R for R weight shifting.     Consulted and Agree with Plan of Care Patient;Family member/caregiver   Family Member Consulted spouse      Patient will benefit from skilled therapeutic intervention in order to improve the following deficits and impairments:  Abnormal gait, Decreased balance, Decreased activity tolerance, Decreased strength, Postural dysfunction, Obesity, Decreased endurance, Decreased knowledge of use of DME, Decreased mobility  Visit Diagnosis: Hemiplegia and hemiparesis following cerebral infarction affecting left non-dominant side (HCC)  Unsteadiness on feet  Other abnormalities of gait and mobility  Muscle weakness (generalized)     Problem List Patient Active Problem List   Diagnosis Date Noted  . Benign essential HTN   . 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 Embolic cerebral infarction ([place]) 06/21/2016  . Stroke syndrome ([place])   . OSA (obstructive sleep  apnea)   . Dysphagia, post-stroke   . Ataxia,  post-stroke   . Neurologic gait disorder   . Encounter for monitoring flecainide therapy 04/28/2016  . Pure hyperglyceridemia 04/26/2016  . Recurrent pulmonary emboli ([place]) 06/13/2015  . GERD (gastroesophageal reflux disease) 06/13/2015  . Asthma 06/13/2015  . Basal cell carcinoma of skin 06/28/2014  . 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 Sinus bradycardia 12/15/2013  . PAF (paroxysmal atrial fibrillation) ([place]) 07/29/2013  . Chronic anticoagulation 07/22/2013  . Gastric ulcer with hemorrhage 07/20/13 07/22/2013  . Diastolic congestive heart failure ([place]) 07/16/2013  . H/O laparoscopic adjustable gastric banding & hiatal hernia repair 04/08/13 04/23/2013  . Prediabetes 02/05/2013  . TMJ (temporomandibular joint syndrome) 09/11/2012  . Morbid obesity ([place]) 08/09/2012  . Peripheral neuropathy ([place]) 11/02/2009  . Hyperlipidemia with target LDL less than 130 09/10/2008  . Depression 09/10/2008  . GOUT 06/28/2006  . BPH (benign prostatic hyperplasia) 06/28/2006    Raylene Everts, PT, DPT 07/26/16    5:15 PM    [facility] [address] [place], [place], [address] Phone: [phone]   Fax:  [phone]  Name: [name] MRN: [id number] Date of Birth: 01-Jan-1946

## 2016-07-26 NOTE — Patient Instructions (Signed)
Gaze Stabilization: Sitting    Keeping eyes on target on wall 3-4 feet away, cover one eye and move head side to side for _30-60___ seconds. Repeat while moving head up and down for __30-60__ seconds.  Cover other eye and repeat.  Do not wear your glasses Do __2-3__ sessions per day.  Copyright  VHI. All rights reserved.   Gaze Stabilization: Tip Card  1.Target must remain in focus, not blurry, and appear stationary while head is in motion. 2.Perform exercises with small head movements (45 to either side of midline). 3.Increase speed of head motion so long as target is in focus. 4.If you wear eyeglasses, be sure you can see target through lens (therapist will give specific instructions for bifocal / progressive lenses). 5.These exercises may provoke dizziness or nausea. Work through these symptoms. If too dizzy, slow head movement slightly. Rest between each exercise. 6.Exercises demand concentration; avoid distractions.  Copyright  VHI. All rights reserved.

## 2016-07-28 ENCOUNTER — Encounter: Payer: Self-pay | Admitting: *Deleted

## 2016-07-28 ENCOUNTER — Ambulatory Visit (HOSPITAL_BASED_OUTPATIENT_CLINIC_OR_DEPARTMENT_OTHER): Payer: Medicare Other | Admitting: Physical Medicine & Rehabilitation

## 2016-07-28 ENCOUNTER — Encounter: Payer: Self-pay | Admitting: Physical Medicine & Rehabilitation

## 2016-07-28 ENCOUNTER — Encounter: Payer: Medicare Other | Attending: Physical Medicine & Rehabilitation

## 2016-07-28 ENCOUNTER — Other Ambulatory Visit: Payer: Self-pay | Admitting: *Deleted

## 2016-07-28 VITALS — BP 120/75 | HR 61 | Resp 14

## 2016-07-28 DIAGNOSIS — G4733 Obstructive sleep apnea (adult) (pediatric): Secondary | ICD-10-CM | POA: Insufficient documentation

## 2016-07-28 DIAGNOSIS — G463 Brain stem stroke syndrome: Secondary | ICD-10-CM | POA: Insufficient documentation

## 2016-07-28 DIAGNOSIS — I10 Essential (primary) hypertension: Secondary | ICD-10-CM | POA: Diagnosis not present

## 2016-07-28 DIAGNOSIS — Z7901 Long term (current) use of anticoagulants: Secondary | ICD-10-CM | POA: Diagnosis not present

## 2016-07-28 DIAGNOSIS — I4891 Unspecified atrial fibrillation: Secondary | ICD-10-CM | POA: Diagnosis not present

## 2016-07-28 NOTE — Patient Outreach (Signed)
Patient triggered Red on EMMI Stroke Dashboard.  Notification sent to:  Sherrin Daisy, RN

## 2016-07-28 NOTE — Progress Notes (Signed)
Subjective:    Patient ID: [name], male    DOB: 1946/03/14, 71 y.o.   MRN: [id number] 71 year old right-handed male with history of atrial fibrillation, pulmonary emboli, maintained on Eliquis, as well as history of hypertension, obstructive sleep apnea, diastolic congestive heart failure.  He lives with spouse independent with a cane and a walker prior to admission.  One-level home.  Presented June 19, 2016, with headache, sudden onset while watching television leaning to the left side as well as slurred speech.  Cranial CT scan negative.  He did not receive tPA.  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  No proximal occlusion.  No significant stenosis or dissection of carotid arteries.  MRI showed a small left medullary infarct as well as LV4 occlusion.  Interventional Radiology consulted, no need for emergent endovascular treatment.  Echocardiogram with ejection fraction 94%, grade 1 diastolic dysfunction.  DATE OF ADMISSION:  06/21/2016 DATE OF DISCHARGE:  07/12/2016  HPI Chronic sleep issue on CPAP  Chronic urinary freq on Lasix No eye problems except needing trifocal PTA  Occ strangled on liquids only, no persistent cough  Fell against wall 1 st day he was home. No injury  Left knee buckles going down hill.  S/P TKR , no M-L instability noted  Seen by PCP last week  Pain Inventory Average Pain 0 Pain Right Now 0 My pain is no pain  In the last 24 hours, has pain interfered with the following? General activity 0 Relation with others 0 Enjoyment of life 0 What TIME of day is your pain at its worst? no pain Sleep (in general) Good  Pain is worse with: no pain Pain improves with: no pain Relief from Meds: no pain  Mobility walk with assistance use a walker do you drive?  no use a wheelchair  Function retired I need assistance with the following:  bathing  Neuro/Psych trouble  walking dizziness  Prior Studies hospital f/u CLINICAL DATA:  Left retro-orbital headache and sensation of staggering to the left or leaning.  EXAM: MRI HEAD WITHOUT CONTRAST  TECHNIQUE: Multiplanar, multiecho pulse sequences of the brain and surrounding structures were obtained without intravenous contrast.  COMPARISON:  Head CT 06/19/2016  Brain MRI 12/16/2013  FINDINGS: Brain: There is a subtle focus of reduced diffusion within the left aspect of the medulla (series 5, image 10, series 6, image 15). The finding is duplicated on the coronal diffusion weighted imaging, which argues against the possibility that it is an artifact. The brain parenchymal signal is otherwise normal. No mass lesion or midline shift. No hydrocephalus or extra-axial fluid collection. The midline structures are normal. No age advanced or lobar predominant atrophy.  Vascular: There is loss of the normal flow void within knee left vertebral artery. The other major intracranial flow voids are preserved. Remote microhemorrhage in the left pons.  Skull and upper cervical spine: The visualized skull base, calvarium, upper cervical spine and extracranial soft tissues are normal.  Sinuses/Orbits: No fluid levels or advanced mucosal thickening. No mastoid effusion. Normal orbits.  IMPRESSION: 1. Faint focus of reduced diffusion within the left aspect of the medulla, in the region of the gracile fasciculus, concerning for a small acute infarct, particularly in the context of the reported symptoms. 2. Abnormal signal within the V4 segment of the left vertebral artery, compatible with occlusion seen on earlier CTA. 3. Single focus of remote microhemorrhage in the left pons but no  acute intracranial hemorrhage.   Electronically Signed   By: Ulyses Jarred M.D.   On: 06/19/2016 06:31 Physicians involved in your care hospital f/u   Family History  Problem Relation Age of Onset  .  Depression Mother   . Hypertension Mother   . Heart disease Father     MI  . Depression Brother   . Stroke Paternal Aunt     CVA  . Diabetes Paternal Uncle   . Heart disease Paternal Uncle     MI  . Heart disease Paternal Grandmother     MI  . Diabetes Cousin     PATERNAL   Social History   Social History  . Marital status: Married    Spouse name: N/A  . Number of children: 1  . Years of education: N/A   Occupational History  . retired-Manager [employer]    Social History Main Topics  . Smoking status: Former Smoker    Packs/day: 0.20    Years: 13.00    Types: Cigarettes    Start date: 04/03/1961    Quit date: 08/30/1974  . Smokeless tobacco: Never Used  . Alcohol use 0.0 oz/week     Comment: Occasionally; 1 small drink a week  . Drug use: No  . Sexual activity: Not Currently   Other Topics Concern  . None   Social History Narrative  . None   Past Surgical History:  Procedure Laterality Date  . ARTHROSCOPY KNEE W/ DRILLING    . BREATH TEK H PYLORI N/A 12/23/2012   Procedure: BREATH TEK H PYLORI;  Surgeon: Gayland Curry, MD;  Location: [facility];  Service: General;  Laterality: N/A;  . CHOLECYSTECTOMY  1989  . COLONOSCOPY N/A 07/31/2013   Procedure: COLONOSCOPY;  Surgeon: Gatha Mayer, MD;  Location: [place];  Service: Endoscopy;  Laterality: N/A;  . ESOPHAGOGASTRODUODENOSCOPY N/A 07/22/2013   Procedure: ESOPHAGOGASTRODUODENOSCOPY (EGD);  Surgeon: Irene Shipper, MD;  Location: [facility] END[facility]COPY;  Service: Endoscopy;  Laterality: N/A;  . ESOPHAGOGASTRODUODENOSCOPY N/A 07/31/2013   Procedure: ESOPHAGOGASTRODUODENOSCOPY (EGD);  Surgeon: Gatha Mayer, MD;  Location[facility]COPY;  Service: Endoscopy;  Laterality: N/A;  . INTRAOPERATIVE TRANSESOPHAGEAL ECHOCARDIOGRAM N/A 07/18/2013   Procedure: INTRAOPERATIVE TRANSESOPHAGEAL ECHOCARDIOGRAM;  Surgeon: Grace Isaac, MD;  Location: [place];  Service: Open Heart Surgery;  Laterality: N/A;  . LAPAROSCOPIC GASTRIC  BANDING WITH HIATAL HERNIA REPAIR N/A 04/08/2013   Procedure: LAPAROSCOPIC GASTRIC BANDING WITH possible  HIATAL HERNIA REPAIR;  Surgeon: Gayland Curry, MD;  Location: [facility];  Service: General;  Laterality: N/A;  . LITHOTRIPSY    . renal calculi    . SUBXYPHOID PERICARDIAL WINDOW N/A 07/18/2013   Procedure: SUBXYPHOID PERICARDIAL WINDOW;  Surgeon: Grace Isaac, MD;  Location: [place];  Service: Thoracic;  Laterality: N/A;  . TONSILLECTOMY    . total knee replacment     bilat  . trigger finger repair     also lue, cts surgically repaired  . WISDOM TOOTH EXTRACTION     Past Medical History:  Diagnosis Date  . Anemia 07/29/2013  . Asthma    childhood asthma  . Benign prostatic hypertrophy    several prostate biopsies neg for cancer.   . Bilateral hip pain   . Cancer ([place])   . CHF (congestive heart failure) ([place])   . Complication of anesthesia    MORPHINE CAUSES HALLUCINATIONS  . CVA (cerebral vascular accident) ([place]) 06/2016  . Depression   . FH: colonic polyps 2010  .  Gout   . History of kidney stones   . Hypertension   . Kidney cysts   . Morbid obesity ([place])   . Numbness    feet / legs  . PAF (paroxysmal atrial fibrillation) with RVR 07/29/2013  . Sleep apnea    USES C PAP   BP 120/75   Pulse 61   Resp 14   SpO2 96%   Opioid Risk Score:   Fall Risk Score:  `1  Depression screen PHQ 2/9  Depression screen [facility] 2/9 02/16/2015 03/30/2014  Decreased Interest 0 0  Down, Depressed, Hopeless 0 0  PHQ - 2 Score 0 0  Some recent data might be hidden    Review of Systems  Constitutional: Negative.   HENT: Negative.   Eyes: Negative.   Respiratory: Negative.   Cardiovascular: Negative.   Gastrointestinal: Negative.   Endocrine: Negative.   Genitourinary: Negative.   Musculoskeletal: Positive for gait problem.  Allergic/Immunologic: Negative.   Neurological: Positive for dizziness.  Hematological: Negative.   Psychiatric/Behavioral: Negative.   All other systems  reviewed and are negative.      Objective:   Physical Exam  Constitutional: He appears well-developed. He appears lethargic.  Morbid obesity   Eyes: Conjunctivae and EOM are normal. Pupils are equal, round, and reactive to light.  Cardiovascular: Normal rate, regular rhythm and normal heart sounds.   Pulmonary/Chest: Effort normal and breath sounds normal. No respiratory distress. He has no wheezes.  Abdominal: Soft. Bowel sounds are normal. He exhibits no distension. There is no tenderness.  Neurological: He appears lethargic.  Decreased Left V1 and V2 pp  Vertical diplopia on lateral gaze to Left and Right   Psychiatric: He has a normal mood and affect. His behavior is normal.  Motor strength is 5/5 in the right deltoid, biceps, triceps, grip, hip flexion, extension and wrist flexion. 4/5 in the left deltoid, biceps, triceps, grip, hip flexion, knee extension, ankle dorsiflexion. Left knee without evidence of effusion. Neuro:  Eyes without evidence of nystagmus  Tone is normal without evidence of spasticity Cerebellar exam shows no evidence of ataxia on finger nose finger or heel to shin testing + evidence of trunkal ataxia  Sensory exam is normal to pinprick, proprioception and light touch in the upper and lower limbs   Cranial nerves II- Visual fields are intact to confrontation testing, no blurring of vision III- no evidence of ptosis, upward, downward and medial gaze intact IV- + vertical diplopia but no skew dviation , neg head tilt V- + facial numbness or masseter weakness VI- no pupil abduction weakness VII- no facial droop, good lid closure VII- normal auditory acuity IX- + pharygeal weakness, gag nl X- + pharyngeal weakness, no hoarseness XI- no trap or SCM weakness XII- no glossal weakness          Assessment & Plan:  1. Left medullary infarct, Wallenberg syndrome, with truncal ataxia, hoarseness d/t nerve IX and X. Palsy, and left facial numbness due to  V1, V2 involvement  His visual complaints are not compatible with his stroke location. Not able to isolate to a particular cranial nerve. He may have a more subtle visual disturbance. Post-CVA and I would therefore wait several months, as this should clear fbefore seeing a neuro-ophthalmologist.  2. Left knee instability. He does have some weakness and he tends to buckle forward one loading is quad in a downhill direction. Do not think a knee orthosis would be helpful in this situation. Recommend aggressive strengthening. Losing weight  would also be helpful.  3. Atrial fibrillation follow-up with cardiology  Physical medicine and rehabilitation follow-up in 4-6 weeks

## 2016-07-28 NOTE — Patient Outreach (Signed)
[facility]) Care Management  07/28/2016  [name] 1945/10/20 [id number]  EMMI-Stroke referral via red dashboard for feeling worse overall -yes & went to follow up appointment-no- Day #13 on 07/27/16.  Telephone call to patient who gave HIPPA verification.  States he is feeling better overall with some days better than others. States he has attended primary care hospital follow up appointment which was 4/16. States has follow up with neurologist today 07/28/2016.   Voices he is currently going to outpatient physical therapy sessions twice weekly. States spouse is taking him to all of his appointments.   Voices that he is managing his own medications & is taking as prescribed by his doctor. States he will call 911 if signs of stroke occur. Major stroke symptoms reviewed with patient.   Patient has no further concerns at this time.  Plan: Send Educational Literature on stroke symptoms as a means of review. Send case to be closed out by care management assistant.   Sherrin Daisy, RN BSN [place] Management Coordinator [facility] Care Management  [phone]

## 2016-07-31 ENCOUNTER — Telehealth: Payer: Self-pay

## 2016-07-31 DIAGNOSIS — E785 Hyperlipidemia, unspecified: Secondary | ICD-10-CM

## 2016-07-31 MED ORDER — ATORVASTATIN CALCIUM 80 MG PO TABS: 80.0000 mg | ORAL_TABLET | Freq: Every day | ORAL | 1 refills | Status: DC

## 2016-07-31 NOTE — Telephone Encounter (Signed)
erx for atorvastatin 80 mg sent as requested.

## 2016-07-31 NOTE — Telephone Encounter (Signed)
I reviewed your visit note with Mr. Vogelsang on the 27th.  I saw where you also noted diplopia with R and L lateral gaze and that this is inconsistent with his infarct.  I went back and looked at [facility] OT evaluation and she had documented he was having diplopia with vertical gaze only and it was not affecting his gait and balance.    The diplopia he is now experiencing seems to be a new onset and completely different from the diplopia he was reporting in the hospital and when he first started outpatient.  Is there any concern that he may have had another infarct over the past couple of weeks?  Would he benefit from a re-evaluation by OT?  Thanks, Raylene Everts, PT, DPT 07/31/16    5:00 PM

## 2016-08-01 ENCOUNTER — Encounter: Payer: Self-pay | Admitting: Physical Therapy

## 2016-08-01 ENCOUNTER — Ambulatory Visit: Payer: Medicare Other | Attending: Physical Medicine & Rehabilitation | Admitting: Physical Therapy

## 2016-08-01 DIAGNOSIS — I69354 Hemiplegia and hemiparesis following cerebral infarction affecting left non-dominant side: Secondary | ICD-10-CM | POA: Diagnosis not present

## 2016-08-01 DIAGNOSIS — R2689 Other abnormalities of gait and mobility: Secondary | ICD-10-CM

## 2016-08-01 DIAGNOSIS — R2681 Unsteadiness on feet: Secondary | ICD-10-CM | POA: Diagnosis not present

## 2016-08-01 DIAGNOSIS — M6281 Muscle weakness (generalized): Secondary | ICD-10-CM | POA: Diagnosis not present

## 2016-08-01 NOTE — Therapy (Signed)
[facility] [address] [place], [place], [address] Phone: [phone]   Fax:  [phone]  Physical Therapy Treatment  Patient Details  Name: [name] MRN: [id number] Date of Birth: 23-Apr-1945 Referring Provider: Dr. Alysia Penna  Encounter Date: 08/01/2016      PT End of Session - 08/01/16 1329    Visit Number 6   Number of Visits 16   Date for PT Re-Evaluation 09/07/16   Authorization Type Medicare-G Codes and Progress Notes   PT Start Time 9735   PT Stop Time 1400   PT Time Calculation (min) 43 min   Activity Tolerance Patient tolerated treatment well   Behavior During Therapy [facility] for tasks assessed/performed      Past Medical History:  Diagnosis Date  . Anemia 07/29/2013  . Asthma    childhood asthma  . Benign prostatic hypertrophy    several prostate biopsies neg for cancer.   . Bilateral hip pain   . Cancer ([place])   . CHF (congestive heart failure) ([place])   . Complication of anesthesia    MORPHINE CAUSES HALLUCINATIONS  . CVA (cerebral vascular accident) ([place]) 06/2016  . Depression   . FH: colonic polyps 2010  . Gout   . History of kidney stones   . Hypertension   . Kidney cysts   . Morbid obesity ([place])   . Numbness    feet / legs  . PAF (paroxysmal atrial fibrillation) with RVR 07/29/2013  . Sleep apnea    USES C PAP    Past Surgical History:  Procedure Laterality Date  . ARTHROSCOPY KNEE W/ DRILLING    . BREATH TEK H PYLORI N/A 12/23/2012   Procedure: BREATH TEK H PYLORI;  Surgeon: Gayland Curry, MD;  Location: [facility];  Service: General;  Laterality: N/A;  . CHOLECYSTECTOMY  1989  . COLONOSCOPY N/A 07/31/2013   Procedure: COLONOSCOPY;  Surgeon: Gatha Mayer, MD;  Location: [place];  Service: Endoscopy;  Laterality: N/A;  . ESOPHAGOGASTRODUODENOSCOPY N/A 07/22/2013   Procedure: ESOPHAGOGASTRODUODENOSCOPY (EGD);  Surgeon: Irene Shipper, MD;  Location: [facility] ENDOSCOPY;   Service: Endoscopy;  Laterality: N/A;  . ESOPHAGOGASTRODUODENOSCOPY N/A 07/31/2013   Procedure: ESOPHAGOGASTRODUODENOSCOPY (EGD);  Surgeon: Gatha Mayer, MD;  Location: [facility] ENDOSCOPY;  Service: Endoscopy;  Laterality: N/A;  . INTRAOPERATIVE TRANSESOPHAGEAL ECHOCARDIOGRAM N/A 07/18/2013   Procedure: INTRAOPERATIVE TRANSESOPHAGEAL ECHOCARDIOGRAM;  Surgeon: Grace Isaac, MD;  Location: [place];  Service: Open Heart Surgery;  Laterality: N/A;  . LAPAROSCOPIC GASTRIC BANDING WITH HIATAL HERNIA REPAIR N/A 04/08/2013   Procedure: LAPAROSCOPIC GASTRIC BANDING WITH possible  HIATAL HERNIA REPAIR;  Surgeon: Gayland Curry, MD;  Location: [facility];  Service: General;  Laterality: N/A;  . LITHOTRIPSY    . renal calculi    . SUBXYPHOID PERICARDIAL WINDOW N/A 07/18/2013   Procedure: SUBXYPHOID PERICARDIAL WINDOW;  Surgeon: Grace Isaac, MD;  Location: [place];  Service: Thoracic;  Laterality: N/A;  . TONSILLECTOMY    . total knee replacment     bilat  . trigger finger repair     also lue, cts surgically repaired  . WISDOM TOOTH EXTRACTION      There were no vitals filed for this visit.      Subjective Assessment - 08/01/16 1323    Subjective Had a near fall, wall caught him.  Was trying to do things he used to do before the CVA, let go of walker and realized he should not. Did walk into  clinic today with RW.    Patient is accompained by: Family member   Pertinent History asthma, basal cell carcinoma, obesity, gout, depression, HTN, OSA, peripheral neuropathy, CHF, PAF, PE, bil TKA   Limitations Walking   Patient Stated Goals improve walking, balance, wants to drive again and be independent   Currently in Pain? No/denies   Pain Score 0-No pain     treatment today: reviewed the following that was issued last session and addressed all related questions:   Gaze Stabilization: Sitting    Keeping eyes on target on wall 3-4 feet away, cover one eye and move head side to side for _30-60___ seconds.  Repeat while moving head up and down for __30-60__ seconds.  Cover other eye and repeat.  Do not wear your glasses Do __2-3__ sessions per day.  Copyright  VHI. All rights reserved.   New this session: also issued to HEP after practice in session  The Fransisco Beau string is commonly employed during treatment of convergence insufficiency and other anomalies of binocular vision. It is used to develop skills of convergence as well as to disrupt suppression of one of the eyes.  Steps for use of modified Brock string made of thera-bands: 1. Hold green band at your nose 2. Focus eyes on just the red band. 3. Then focus eyes on just the yellow band.  4. Hold each focus for 5-6 seconds before moving to the other band. 5. Repeat for 8 reps to each band.        [facility] Adult PT Treatment/Exercise - 08/01/16 0001      Transfers   Transfers Sit to Stand;Stand to Sit   Sit to Stand 5: Supervision;With upper extremity assist;From chair/3-in-1;From bed   Sit to Stand Details Verbal cues for sequencing;Verbal cues for technique;Verbal cues for safe use of DME/AE;Manual facilitation for placement;Manual facilitation for weight shifting   Sit to Stand Details (indicate cue type and reason) increased time needed to stand with cues on technique- scooting closer to edge of chair and equal LE use with standing   Stand to Sit 5: Supervision;With upper extremity assist;To chair/3-in-1;To bed   Stand to Sit Details (indicate cue type and reason) Verbal cues for precautions/safety;Verbal cues for technique;Manual facilitation for weight shifting;Verbal cues for safe use of DME/AE   Stand to Sit Details cues for use of arms to control descent and for use of LE's as well     Ambulation/Gait   Ambulation/Gait Yes   Ambulation/Gait Assistance 4: Min guard;5: Supervision   Ambulation/Gait Assistance Details cues on posture, incr step length and walker position with gait   Ambulation Distance (Feet) 100 Feet  x1, 110 x1     Assistive device Rolling walker   Gait Pattern Step-through pattern;Decreased stride length;Decreased step length - right;Decreased step length - left;Decreased trunk rotation;Trunk flexed;Narrow base of support   Ambulation Surface Level;Indoor            PT Education - 08/01/16 2252    Education provided Yes   Education Details HEP from last session- single eye gaze stabilization; Fransisco Beau string for convergence training and use of modified Brock string made of theraband for HEP.                              Person(s) Educated Patient   Methods Explanation;Demonstration;Verbal cues;Handout   Comprehension Verbalized understanding;Returned demonstration;Need further instruction          PT Short Term Goals -  07/23/16 1458      PT SHORT TERM GOAL #1   Title verbalize understanding of CVA risk factors/warning signs (07/11/16)   Time 4   Period Weeks   Status On-going     PT SHORT TERM GOAL #2   Title improve timed up and go to < 30 sec for improved mobility (08/10/16)   Time 4   Period Weeks   Status On-going     PT SHORT TERM GOAL #3   Title improve gait velocity to > 1.32 ft/sec for improved mobility and community access (08/10/16)   Time 4   Period Weeks   Status On-going     PT SHORT TERM GOAL #4   Title amb > 300' on level indoor/paved outdoor surfaces with LRAD with supervision for improved community access (08/10/16)   Time 4   Period Weeks   Status On-going     PT SHORT TERM GOAL #5   Title perform BERG with appropriate LTG to be written (08/10/16)   Baseline 22/56 on 07/19/16   Time 4   Period Weeks   Status Achieved           PT Long Term Goals - 07/23/16 1458      PT LONG TERM GOAL #1   Title independent with HEP (09/07/16)   Time 8   Period Weeks   Status On-going     PT LONG TERM GOAL #2   Title improve timed up and go to < 21 sec with LRAD for improved mobility (09/07/16)   Time 8   Period Weeks   Status On-going     PT LONG TERM GOAL #3    Title improve gait velocity to > 1.8 ft/sec for improved mobility and decreased fall risk (09/07/16)   Time 8   Period Weeks   Status On-going     PT LONG TERM GOAL #4   Title amb > 500' on various indoor/outdoor surfaces with LRAD modified independent for improved independence and mobility (09/07/16)   Time 8   Period Weeks   Status On-going     PT LONG TERM GOAL #5   Title Pt will decrease falls risk as indicated by an increase in BERG balance score to >32/56    Baseline 22/56   Time 8   Period Weeks   Status New            Plan - 08/01/16 1329    Clinical Impression Statement Today's skilled session continued to address strategies to assist pt with his diplopia and decr convergence. Also worked on gait pattern and safety. Pt is making steady progress toward goals and should benefit from continued PT to address unmet goals.                         Rehab Potential Good   Clinical Impairments Affecting Rehab Potential multiple comorbidities   PT Treatment/Interventions ADLs/Self Care Home Management;Cryotherapy;Electrical Stimulation;Moist Heat;Neuromuscular re-education;Balance training;Therapeutic exercise;Therapeutic activities;Functional mobility training;Gait training;Stair training;Patient/family education;Orthotic Fit/Training;Manual techniques;Vestibular;Energy conservation   PT Next Visit Plan review with monocular vision, corrective saccades as needed.  Use of gaze stability during gait and tactile feedback on R for R weight shifting.     Consulted and Agree with Plan of Care Patient;Family member/caregiver   Family Member Consulted spouse      Patient will benefit from skilled therapeutic intervention in order to improve the following deficits and impairments:  Abnormal gait, Decreased balance, Decreased activity tolerance, Decreased strength,  Postural dysfunction, Obesity, Decreased endurance, Decreased knowledge of use of DME, Decreased mobility  Visit  Diagnosis: Unsteadiness on feet  Other abnormalities of gait and mobility  Muscle weakness (generalized)     Problem List Patient Active Problem List   Diagnosis Date Noted  . Benign essential HTN   . 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 Embolic cerebral infarction ([place]) 06/21/2016  . Stroke syndrome ([place])   . OSA (obstructive sleep apnea)   . Dysphagia, post-stroke   . Ataxia, post-stroke   . Neurologic gait disorder   . Encounter for monitoring flecainide therapy 04/28/2016  . Pure hyperglyceridemia 04/26/2016  . Recurrent pulmonary emboli ([place]) 06/13/2015  . GERD (gastroesophageal reflux disease) 06/13/2015  . Asthma 06/13/2015  . Basal cell carcinoma of skin 06/28/2014  . 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 Sinus bradycardia 12/15/2013  . PAF (paroxysmal atrial fibrillation) ([place]) 07/29/2013  . Chronic anticoagulation 07/22/2013  . Gastric ulcer with hemorrhage 07/20/13 07/22/2013  . Diastolic congestive heart failure ([place]) 07/16/2013  . H/O laparoscopic adjustable gastric banding & hiatal hernia repair 04/08/13 04/23/2013  . Prediabetes 02/05/2013  . TMJ (temporomandibular joint syndrome) 09/11/2012  . Morbid obesity ([place]) 08/09/2012  . Peripheral neuropathy ([place]) 11/02/2009  . Hyperlipidemia with target LDL less than 130 09/10/2008  . Depression 09/10/2008  . GOUT 06/28/2006  . BPH (benign prostatic hyperplasia) 06/28/2006    Willow Ora, PTA, [facility] [address] [place], [place] [address] [phone] 08/01/16, 11:20 PM   Name: [name] MRN: [id number] Date of Birth: Feb 17, 1946

## 2016-08-01 NOTE — Patient Instructions (Addendum)
The Frans[place] Beau string is commonly employed during treatment of convergence insufficiency and other anomalies of binocular vision. It is used to develop skills of convergence as well as to disrupt suppression of one of the eyes.   Steps for use of modified Brock string made of bands: 1. Hold green band at your nose 2. Focus eyes on just the red band. 3. Then focus eyes on just the yellow band.  4. Hold each focus for 5-6 seconds before moving to the other band. 5. Repeat for 8 reps to each band.

## 2016-08-04 ENCOUNTER — Encounter: Payer: Self-pay | Admitting: Physical Therapy

## 2016-08-04 ENCOUNTER — Ambulatory Visit: Payer: Medicare Other | Admitting: Physical Therapy

## 2016-08-04 VITALS — BP 120/70

## 2016-08-04 DIAGNOSIS — M6281 Muscle weakness (generalized): Secondary | ICD-10-CM | POA: Diagnosis not present

## 2016-08-04 DIAGNOSIS — I69354 Hemiplegia and hemiparesis following cerebral infarction affecting left non-dominant side: Secondary | ICD-10-CM

## 2016-08-04 DIAGNOSIS — R2689 Other abnormalities of gait and mobility: Secondary | ICD-10-CM | POA: Diagnosis not present

## 2016-08-04 DIAGNOSIS — R2681 Unsteadiness on feet: Secondary | ICD-10-CM

## 2016-08-04 NOTE — Patient Instructions (Addendum)
Compensatory Strategies: Corrective Saccades    1. Holding two stationary targets placed _12___ inches apart on back of door, move eyes to target, keep head still. 2. Then move head in direction of target while eyes remain on target. 3/4. Repeat in opposite direction.  Perform side to side, up and down and diagonals Perform sitting. Repeat sequence __1:00 min each__ times per session. Do __2__ sessions per day.  Copyright  VHI. All rights reserved.

## 2016-08-04 NOTE — Therapy (Signed)
[facility] [address] [place], [place], [address] Phone: [phone]   Fax:  [phone]  Physical Therapy Treatment  Patient Details  Name: [name] MRN: [id number] Date of Birth: [birth year] Referring Provider: Dr. Alysia Penna  Encounter Date: 08/04/2016      PT End of Session - 08/04/16 1652    Visit Number 7   Number of Visits 16   Date for PT Re-Evaluation 09/07/16   Authorization Type Medicare-G Codes and Progress Notes   PT Start Time 1406   PT Stop Time 1447   PT Time Calculation (min) 41 min   Activity Tolerance Patient tolerated treatment well   Behavior During Therapy [facility] for tasks assessed/performed      Past Medical History:  Diagnosis Date  . Anemia 07/29/2013  . Asthma    childhood asthma  . Benign prostatic hypertrophy    several prostate biopsies neg for cancer.   . Bilateral hip pain   . Cancer ([place])   . CHF (congestive heart failure) ([place])   . Complication of anesthesia    MORPHINE CAUSES HALLUCINATIONS  . CVA (cerebral vascular accident) ([place]) 06/2016  . Depression   . FH: colonic polyps 2010  . Gout   . History of kidney stones   . Hypertension   . Kidney cysts   . Morbid obesity ([place])   . Numbness    feet / legs  . PAF (paroxysmal atrial fibrillation) with RVR 07/29/2013  . Sleep apnea    USES C PAP    Past Surgical History:  Procedure Laterality Date  . ARTHROSCOPY KNEE W/ DRILLING    . BREATH TEK H PYLORI N/A 12/23/2012   Procedure: BREATH TEK H PYLORI;  Surgeon: Gayland Curry, MD;  Location: [facility];  Service: General;  Laterality: N/A;  . CHOLECYSTECTOMY  1989  . COLONOSCOPY N/A 07/31/2013   Procedure: COLONOSCOPY;  Surgeon: Gatha Mayer, MD;  Location: [facility];  Service: Endoscopy;  Laterality: N/A;  . ESOPHAGOGASTRODUODENOSCOPY N/A 07/22/2013   Procedure: ESOPHAGOGASTRODUODENOSCOPY (EGD);  Surgeon: Irene Shipper, MD;  Location: [facility] ENDOSCOPY;   Service: Endoscopy;  Laterality: N/A;  . ESOPHAGOGASTRODUODENOSCOPY N/A 07/31/2013   Procedure: ESOPHAGOGASTRODUODENOSCOPY (EGD);  Surgeon: Gatha Mayer, MD;  Location: [facility] ENDOSCOPY;  Service: Endoscopy;  Laterality: N/A;  . INTRAOPERATIVE TRANSESOPHAGEAL ECHOCARDIOGRAM N/A 07/18/2013   Procedure: INTRAOPERATIVE TRANSESOPHAGEAL ECHOCARDIOGRAM;  Surgeon: Grace Isaac, MD;  Location: [facility];  Service: Open Heart Surgery;  Laterality: N/A;  . LAPAROSCOPIC GASTRIC BANDING WITH HIATAL HERNIA REPAIR N/A 04/08/2013   Procedure: LAPAROSCOPIC GASTRIC BANDING WITH possible  HIATAL HERNIA REPAIR;  Surgeon: Gayland Curry, MD;  Location: [facility];  Service: General;  Laterality: N/A;  . LITHOTRIPSY    . renal calculi    . SUBXYPHOID PERICARDIAL WINDOW N/A 07/18/2013   Procedure: SUBXYPHOID PERICARDIAL WINDOW;  Surgeon: Grace Isaac, MD;  Location: [facility];  Service: Thoracic;  Laterality: N/A;  . TONSILLECTOMY    . total knee replacment     bilat  . trigger finger repair     also lue, cts surgically repaired  . WISDOM TOOTH EXTRACTION      Vitals:   08/04/16 1415  BP: 120/70        Subjective Assessment - 08/04/16 1416    Subjective No more near falls but felt very unsteady and clumsy this am.  Not wearing patch over glasses anymore.  Feels like the brock string is helping and vision is  improving.  Still having difficulty with saccades.     Patient is accompained by: Family member   Pertinent History asthma, basal cell carcinoma, obesity, gout, depression, HTN, OSA, peripheral neuropathy, CHF, PAF, PE, bil TKA   Limitations Walking   Patient Stated Goals improve walking, balance, wants to drive again and be independent   Currently in Pain? No/denies          [facility] Adult PT Treatment/Exercise - 08/04/16 1436      Neuro Re-ed    Neuro Re-ed Details  Initially in sitting performed active weight shifting to R with focus on maintaining head in midline and initiating movement from pelvis and  trunk and then maintaining weight shift during L foot taps to soccer ball with min A for head control.  Transitioned to standing and performed same activity with LLE taps to soccer ball and then focus on forwards and retro stepping and weight shifting with mod A         Vestibular Treatment/Exercise - 08/04/16 1423      Vestibular Treatment/Exercise   Gaze Exercises Eye/Head Exercise Horizontal;Eye/Head Exercise Vertical     Eye/Head Exercise Horizontal   Foot Position seated, UE support>>no UE support but pelvis wedged for R weight shift   Reps 2   Comments 1 minute each     Eye/Head Exercise Vertical   Foot Position seated, no UE support, pelvis wedged on L for weight shift to R   Reps 2   Comments 1 rep vertical, 1 rep each direction diagonal                 PT Short Term Goals - 08/04/16 1651      PT SHORT TERM GOAL #1   Title verbalize understanding of CVA risk factors/warning signs (08/10/16)   Time 4   Period Weeks   Status On-going     PT SHORT TERM GOAL #2   Title improve timed up and go to < 30 sec for improved mobility (08/10/16)   Time 4   Period Weeks   Status On-going     PT SHORT TERM GOAL #3   Title improve gait velocity to > 1.32 ft/sec for improved mobility and community access (08/10/16)   Time 4   Period Weeks   Status On-going     PT SHORT TERM GOAL #4   Title amb > 300' on level indoor/paved outdoor surfaces with LRAD with supervision for improved community access (08/10/16)   Time 4   Period Weeks   Status On-going     PT SHORT TERM GOAL #5   Title perform BERG with appropriate LTG to be written (08/10/16)   Baseline 22/56 on 07/19/16   Time 4   Period Weeks   Status Achieved           PT Long Term Goals - 07/23/16 1458      PT LONG TERM GOAL #1   Title independent with HEP (09/07/16)   Time 8   Period Weeks   Status On-going     PT LONG TERM GOAL #2   Title improve timed up and go to < 21 sec with LRAD for improved mobility  (09/07/16)   Time 8   Period Weeks   Status On-going     PT LONG TERM GOAL #3   Title improve gait velocity to > 1.8 ft/sec for improved mobility and decreased fall risk (09/07/16)   Time 8   Period Weeks   Status On-going  PT LONG TERM GOAL #4   Title amb > 500' on various indoor/outdoor surfaces with LRAD modified independent for improved independence and mobility (09/07/16)   Time 8   Period Weeks   Status On-going     PT LONG TERM GOAL #5   Title Pt will decrease falls risk as indicated by an increase in BERG balance score to >32/56    Baseline 22/56   Time 8   Period Weeks   Status New               Plan - 08/04/16 1647    Clinical Impression Statement Pt's diplopia is improving with HEP; focused today's session on corrective saccades in various directions in sitting without UE support.  Performed NMR to focus on trunk control and active lateral weight shifting to R.  Poor carryover of weight shifting techniques during gait with RW with pt returning to forward flexion, gaze looking at floor and shuffling gait.  Will continue to address and progress.     Rehab Potential Good   Clinical Impairments Affecting Rehab Potential multiple comorbidities   PT Treatment/Interventions ADLs/Self Care Home Management;Cryotherapy;Electrical Stimulation;Moist Heat;Neuromuscular re-education;Balance training;Therapeutic exercise;Therapeutic activities;Functional mobility training;Gait training;Stair training;Patient/family education;Orthotic Fit/Training;Manual techniques;Vestibular;Energy conservation   PT Next Visit Plan STG due by 5/10!  review x 1 viewing with monocular vision, corrective saccades during functional movements;  Use of gaze stability during gait (get eyes off floor!); trunk/postural control for R lateral weight shifting/pre-gait activities, stairs    Consulted and Agree with Plan of Care Patient;Family member/caregiver   Family Member Consulted spouse      Patient will  benefit from skilled therapeutic intervention in order to improve the following deficits and impairments:  Abnormal gait, Decreased balance, Decreased activity tolerance, Decreased strength, Postural dysfunction, Obesity, Decreased endurance, Decreased knowledge of use of DME, Decreased mobility  Visit Diagnosis: Unsteadiness on feet  Other abnormalities of gait and mobility  Muscle weakness (generalized)  Hemiplegia and hemiparesis following cerebral infarction affecting left non-dominant side [facility])     Problem List Patient Active Problem List   Diagnosis Date Noted  . Benign essential HTN   . 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 Embolic cerebral infarction ([place]) 06/21/2016  . Stroke syndrome ([place])   . OSA (obstructive sleep apnea)   . Dysphagia, post-stroke   . Ataxia, post-stroke   . Neurologic gait disorder   . Encounter for monitoring flecainide therapy 04/28/2016  . Pure hyperglyceridemia 04/26/2016  . Recurrent pulmonary emboli ([place]) 06/13/2015  . GERD (gastroesophageal reflux disease) 06/13/2015  . Asthma 06/13/2015  . Basal cell carcinoma of skin 06/28/2014  . 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 Sinus bradycardia 12/15/2013  . PAF (paroxysmal atrial fibrillation) ([place]) 07/29/2013  . Chronic anticoagulation 07/22/2013  . Gastric ulcer with hemorrhage 07/20/13 07/22/2013  . Diastolic congestive heart failure ([place]) 07/16/2013  . H/O laparoscopic adjustable gastric banding & hiatal hernia repair 04/08/13 04/23/2013  . Prediabetes 02/05/2013  . TMJ (temporomandibular joint syndrome) 09/11/2012  . Morbid obesity ([place]) 08/09/2012  . Peripheral neuropathy ([place]) 11/02/2009  . Hyperlipidemia with target LDL less than 130 09/10/2008  . Depression 09/10/2008  . GOUT 06/28/2006  . BPH (benign prostatic hyperplasia) 06/28/2006   Raylene Everts, PT, DPT 08/04/16    4:59 PM    [facility] [address] [place], [place], [address] Phone: [phone]   Fax:  [phone]  Name: NEILSON OEHLERT MRN: [id number] Date of Birth: Jan 08, 1946

## 2016-08-07 ENCOUNTER — Other Ambulatory Visit: Payer: Self-pay | Admitting: Internal Medicine

## 2016-08-08 ENCOUNTER — Ambulatory Visit: Payer: Medicare Other | Admitting: Physical Therapy

## 2016-08-08 DIAGNOSIS — R2689 Other abnormalities of gait and mobility: Secondary | ICD-10-CM

## 2016-08-08 DIAGNOSIS — M6281 Muscle weakness (generalized): Secondary | ICD-10-CM

## 2016-08-08 DIAGNOSIS — I69354 Hemiplegia and hemiparesis following cerebral infarction affecting left non-dominant side: Secondary | ICD-10-CM | POA: Diagnosis not present

## 2016-08-08 DIAGNOSIS — R2681 Unsteadiness on feet: Secondary | ICD-10-CM | POA: Diagnosis not present

## 2016-08-08 NOTE — Therapy (Signed)
[facility] [address] [place], [place], [address] Phone: [phone]   Fax:  [phone]  Physical Therapy Treatment  Patient Details  Name: [name] MRN: [id number] Date of Birth: 17-Mar-1946 Referring Provider: Dr. Alysia Penna  Encounter Date: 08/08/2016      PT End of Session - 08/08/16 1452    Visit Number 8   Number of Visits 16   Date for PT Re-Evaluation 09/07/16   Authorization Type Medicare-G Codes and Progress Notes   PT Start Time 9798   PT Stop Time 1445   PT Time Calculation (min) 42 min   Equipment Utilized During Treatment Gait belt   Activity Tolerance Patient tolerated treatment well   Behavior During Therapy [facility] for tasks assessed/performed      Past Medical History:  Diagnosis Date  . Anemia 07/29/2013  . Asthma    childhood asthma  . Benign prostatic hypertrophy    several prostate biopsies neg for cancer.   . Bilateral hip pain   . Cancer ([place])   . CHF (congestive heart failure) ([place])   . Complication of anesthesia    MORPHINE CAUSES HALLUCINATIONS  . CVA (cerebral vascular accident) ([place]) 06/2016  . Depression   . FH: colonic polyps 2010  . Gout   . History of kidney stones   . Hypertension   . Kidney cysts   . Morbid obesity ([place])   . Numbness    feet / legs  . PAF (paroxysmal atrial fibrillation) with RVR 07/29/2013  . Sleep apnea    USES C PAP    Past Surgical History:  Procedure Laterality Date  . ARTHROSCOPY KNEE W/ DRILLING    . BREATH TEK H PYLORI N/A 12/23/2012   Procedure: BREATH TEK H PYLORI;  Surgeon: Gayland Curry, MD;  Location: [facility];  Service: General;  Laterality: N/A;  . CHOLECYSTECTOMY  1989  . COLONOSCOPY N/A 07/31/2013   Procedure: COLONOSCOPY;  Surgeon: Gatha Mayer, MD;  Location: [facility];  Service: Endoscopy;  Laterality: N/A;  . ESOPHAGOGASTRODUODENOSCOPY N/A 07/22/2013   Procedure: ESOPHAGOGASTRODUODENOSCOPY (EGD);   Surgeon: Irene Shipper, MD;  Location: [facility] ENDOSCOPY;  Service: Endoscopy;  Laterality: N/A;  . ESOPHAGOGASTRODUODENOSCOPY N/A 07/31/2013   Procedure: ESOPHAGOGASTRODUODENOSCOPY (EGD);  Surgeon: Gatha Mayer, MD;  Location: [facility] ENDOSCOPY;  Service: Endoscopy;  Laterality: N/A;  . INTRAOPERATIVE TRANSESOPHAGEAL ECHOCARDIOGRAM N/A 07/18/2013   Procedure: INTRAOPERATIVE TRANSESOPHAGEAL ECHOCARDIOGRAM;  Surgeon: Grace Isaac, MD;  Location: [facility];  Service: Open Heart Surgery;  Laterality: N/A;  . LAPAROSCOPIC GASTRIC BANDING WITH HIATAL HERNIA REPAIR N/A 04/08/2013   Procedure: LAPAROSCOPIC GASTRIC BANDING WITH possible  HIATAL HERNIA REPAIR;  Surgeon: Gayland Curry, MD;  Location[facility];  Service: General;  Laterality: N/A;  . LITHOTRIPSY    . renal calculi    . SUBXYPHOID PERICARDIAL WINDOW N/A 07/18/2013   Procedure: SUBXYPHOID PERICARDIAL WINDOW;  Surgeon: Grace Isaac, MD;  Location: [facility];  Service: Thoracic;  Laterality: N/A;  . TONSILLECTOMY    . total knee replacment     bilat  . trigger finger repair     also lue, cts surgically repaired  . WISDOM TOOTH EXTRACTION      There were no vitals filed for this visit.      Subjective Assessment - 08/08/16 1409    Subjective doing well; dizziness is about the same.     Patient Stated Goals improve walking, balance, wants to drive again and be independent  Currently in Pain? No/denies            [facility] PT Assessment - 08/08/16 1429      Ambulation/Gait   Ambulation Distance (Feet) 327 Feet   Gait velocity 1.36 ft/sec  24.09     Timed Up and Go Test   TUG Normal TUG   Normal TUG (seconds) 23.54                     [facility] Adult PT Treatment/Exercise - 08/08/16 1429      Ambulation/Gait   Ambulation/Gait Assistance 5: Supervision   Assistive device Rolling walker   Gait Pattern Step-through pattern;Decreased stride length;Decreased step length - right;Decreased step length - left;Decreased trunk  rotation;Trunk flexed;Narrow base of support   Ambulation Surface Level;Indoor;Outdoor;Paved   Gait Comments adjusted RW to proper height with improved posture     Self-Care   Self-Care Other Self-Care Comments   Other Self-Care Comments  reviewed CVA risk factors/warning signs                PT Education - 08/08/16 1452    Education provided Yes   Education Details CVA ed   Person(s) Educated Patient   Methods Explanation   Comprehension Verbalized understanding          PT Short Term Goals - 08/08/16 1452      PT SHORT TERM GOAL #1   Title verbalize understanding of CVA risk factors/warning signs (08/10/16)   Status Achieved     PT SHORT TERM GOAL #2   Title improve timed up and go to < 30 sec for improved mobility (08/10/16)   Status Achieved     PT SHORT TERM GOAL #3   Title improve gait velocity to > 1.32 ft/sec for improved mobility and community access (08/10/16)   Status Achieved     PT SHORT TERM GOAL #4   Title amb > 300' on level indoor/paved outdoor surfaces with LRAD with supervision for improved community access (08/10/16)   Status Achieved     PT SHORT TERM GOAL #5   Title perform BERG with appropriate LTG to be written (08/10/16)   Status Achieved           PT Long Term Goals - 07/23/16 1458      PT LONG TERM GOAL #1   Title independent with HEP (09/07/16)   Time 8   Period Weeks   Status On-going     PT LONG TERM GOAL #2   Title improve timed up and go to < 21 sec with LRAD for improved mobility (09/07/16)   Time 8   Period Weeks   Status On-going     PT LONG TERM GOAL #3   Title improve gait velocity to > 1.8 ft/sec for improved mobility and decreased fall risk (09/07/16)   Time 8   Period Weeks   Status On-going     PT LONG TERM GOAL #4   Title amb > 500' on various indoor/outdoor surfaces with LRAD modified independent for improved independence and mobility (09/07/16)   Time 8   Period Weeks   Status On-going     PT LONG TERM  GOAL #5   Title Pt will decrease falls risk as indicated by an increase in BERG balance score to >32/56    Baseline 22/56   Time 8   Period Weeks   Status New               Plan -  08/08/16 1453    Clinical Impression Statement Pt has met all STGs and is progressing well with HEP.  RW adjusted to proper height with improved posture and decreased forward flexed trunk with ambulation.  Progressing well towards LTGs and will benefit from continued PT to maximize function and independence.   PT Treatment/Interventions ADLs/Self Care Home Management;Cryotherapy;Electrical Stimulation;Moist Heat;Neuromuscular re-education;Balance training;Therapeutic exercise;Therapeutic activities;Functional mobility training;Gait training;Stair training;Patient/family education;Orthotic Fit/Training;Manual techniques;Vestibular;Energy conservation   PT Next Visit Plan review x 1 viewing with monocular vision, corrective saccades during functional movements;  Use of gaze stability during gait (get eyes off floor!); trunk/postural control for R lateral weight shifting/pre-gait activities, stairs    Consulted and Agree with Plan of Care Patient;Family member/caregiver   Family Member Consulted spouse      Patient will benefit from skilled therapeutic intervention in order to improve the following deficits and impairments:  Abnormal gait, Decreased balance, Decreased activity tolerance, Decreased strength, Postural dysfunction, Obesity, Decreased endurance, Decreased knowledge of use of DME, Decreased mobility  Visit Diagnosis: Hemiplegia and hemiparesis following cerebral infarction affecting left non-dominant side (HCC)  Unsteadiness on feet  Other abnormalities of gait and mobility  Muscle weakness (generalized)     Problem List Patient Active Problem List   Diagnosis Date Noted  . Benign essential HTN   . 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 Embolic cerebral infarction ([place]) 06/21/2016  . Stroke  syndrome ([place])   . OSA (obstructive sleep apnea)   . Dysphagia, post-stroke   . Ataxia, post-stroke   . Neurologic gait disorder   . Encounter for monitoring flecainide therapy 04/28/2016  . Pure hyperglyceridemia 04/26/2016  . Recurrent pulmonary emboli ([place]) 06/13/2015  . GERD (gastroesophageal reflux disease) 06/13/2015  . Asthma 06/13/2015  . Basal cell carcinoma of skin 06/28/2014  . 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 Sinus bradycardia 12/15/2013  . PAF (paroxysmal atrial fibrillation) ([place]) 07/29/2013  . Chronic anticoagulation 07/22/2013  . Gastric ulcer with hemorrhage 07/20/13 07/22/2013  . Diastolic congestive heart failure ([place]) 07/16/2013  . H/O laparoscopic adjustable gastric banding & hiatal hernia repair 04/08/13 04/23/2013  . Prediabetes 02/05/2013  . TMJ (temporomandibular joint syndrome) 09/11/2012  . Morbid obesity ([place]) 08/09/2012  . Peripheral neuropathy ([place]) 11/02/2009  . Hyperlipidemia with target LDL less than 130 09/10/2008  . Depression 09/10/2008  . GOUT 06/28/2006  . BPH (benign prostatic hyperplasia) 06/28/2006      Laureen Abrahams, PT, DPT 08/08/16 2:55 PM    [facility] [address] [place], [place], [address] Phone: [phone]   Fax:  [phone]  Name: [name] MRN: [id number] Date of Birth: [birth year]

## 2016-08-08 NOTE — Patient Instructions (Signed)
Ischemic Stroke An ischemic stroke is the sudden death of brain tissue. Blood carries oxygen to all areas of the body. This type of stroke happens when your blood does not flow to your brain like normal. Your brain cannot get the oxygen it needs. This is an emergency. It must be treated right away. Symptoms of a stroke usually happen all of a sudden. You may notice them when you wake up. They can include:  Weakness or loss of feeling in your face, arm, or leg. This often happens on one side of the body.  Trouble walking.  Trouble moving your arms or legs.  Loss of balance or coordination.  Feeling confused.  Trouble talking or understanding what people are saying.  Slurred speech.  Trouble seeing.  Seeing two of one object (double vision).  Feeling dizzy.  Feeling sick to your stomach (nauseous) and throwing up (vomiting).  A very bad headache for no reason. Get help as soon as any of these problems start. This is important. Some treatments work better if they are given right away. These include:  Aspirin.  Medicines to control blood pressure.  A shot (injection) of medicine to break up the blood clot.  Treatments given in the blood vessel (artery) to take out the clot or break it up. Other treatments may include:  Oxygen.  Fluids given through an IV tube.  Medicines to thin out your blood.  Procedures to help your blood flow better. What increases the risk? Certain things may make you more likely to have a stroke. Some of these are things that you can change, such as:  Being very overweight (obesity).  Smoking.  Taking birth control pills.  Not being active.  Drinking too much alcohol.  Using drugs. Other risk factors include:  High blood pressure.  High cholesterol.  Diabetes.  Heart disease.  Being African American, Native American, Hispanic, or [place] Native.  Being over age 60.  Family history of stroke.  Having had blood clots, stroke,  or warning stroke (transient ischemic attack, TIA) in the past.  Sickle cell disease.  Being a woman with a history of high blood pressure in pregnancy (preeclampsia).  Migraine headache.  Sleep apnea.  Having an irregular heartbeat (atrial fibrillation).  Long-term (chronic) diseases that cause soreness and swelling (inflammation).  Disorders that affect how your blood clots. Follow these instructions at home: Medicines  Take over-the-counter and prescription medicines only as told by your doctor.  If you were told to take aspirin or another medicine to thin your blood, take it exactly as told by your doctor.  Taking too much of the medicine can cause bleeding.  If you do not take enough, it may not work as well.  Know the side effects of your medicines. If you are taking a blood thinner, make sure you:  Hold pressure over any cuts for longer than usual.  Tell your dentist and other doctors that you take this medicine.  Avoid activities that may cause damage or injury to your body. Eating and drinking  Follow instructions from your doctor about what you cannot eat or drink.  Eat healthy foods.  If you have trouble with swallowing, do these things to avoid choking:  Take small bites when eating.  Eat foods that are soft or pureed. Safety  Follow instructions from your health care team about physical activity.  Use a walker or cane as told by your doctor.  Keep your home safe so you do not fall. This   may include:  Having experts look at your home to make sure it is safe.  Putting grab bars in the bedroom and bathroom.  Using raised toilets.  Putting a seat in the shower. General instructions  Do not use any tobacco products.  Examples of these are cigarettes, chewing tobacco, and e-cigarettes.  If you need help quitting, ask your doctor.  Limit how much alcohol you drink. This means no more than 1 drink a day for nonpregnant women and 2 drinks a day  for men. One drink equals 12 oz of beer, 5 oz of wine, or 1 oz of hard liquor.  If you need help to stop using drugs or alcohol, ask your doctor to refer you to a program or specialist.  Stay active. Exercise as told by your doctor.  Keep all follow-up visits as told by your doctor. This is important. Get help right away if:  You suddenly:  Have weakness or loss of feeling in your face, arm, or leg.  Feel confused.  Have trouble talking or understanding what people are saying.  Have trouble seeing.  Have trouble walking.  Have trouble moving your arms or legs.  Feel dizzy.  Lose your balance or coordination.  Have a very bad headache and you do not know why.  You pass out (lose consciousness) or almost pass out.  You have jerky movements that you cannot control (seizure). These symptoms may be an emergency. Do not wait to see if the symptoms will go away. Get medical help right away. Call your local emergency services (911 in the [place] Do not drive yourself to the hospital.  This information is not intended to replace advice given to you by your health care provider. Make sure you discuss any questions you have with your health care provider. Document Released: 03/09/2011 Document Revised: 08/31/2015 Document Reviewed: 06/16/2015 Elsevier Interactive Patient Education  2017 [employer] Inc[employer]  

## 2016-08-10 ENCOUNTER — Encounter: Payer: Self-pay | Admitting: Physical Therapy

## 2016-08-10 ENCOUNTER — Ambulatory Visit: Payer: Medicare Other | Admitting: Physical Therapy

## 2016-08-10 DIAGNOSIS — I69354 Hemiplegia and hemiparesis following cerebral infarction affecting left non-dominant side: Secondary | ICD-10-CM

## 2016-08-10 DIAGNOSIS — R2681 Unsteadiness on feet: Secondary | ICD-10-CM

## 2016-08-10 DIAGNOSIS — M6281 Muscle weakness (generalized): Secondary | ICD-10-CM

## 2016-08-10 DIAGNOSIS — R2689 Other abnormalities of gait and mobility: Secondary | ICD-10-CM

## 2016-08-10 NOTE — Therapy (Signed)
[facility] [address] [place], [place], [address] Phone: [phone]   Fax:  [phone]  Physical Therapy Treatment  Patient Details  Name: [name] MRN: [id number] Date of Birth: 20-Jun-1945 Referring Provider: Dr. Alysia Penna  Encounter Date: 08/10/2016      PT End of Session - 08/10/16 1700    Visit Number 9   Number of Visits 16   Date for PT Re-Evaluation 09/07/16   Authorization Type Medicare-G Codes and Progress Notes   PT Start Time 7001   PT Stop Time 1445   PT Time Calculation (min) 40 min   Activity Tolerance Patient tolerated treatment well   Behavior During Therapy [facility] for tasks assessed/performed      Past Medical History:  Diagnosis Date  . Anemia 07/29/2013  . Asthma    childhood asthma  . Benign prostatic hypertrophy    several prostate biopsies neg for cancer.   . Bilateral hip pain   . Cancer ([place])   . CHF (congestive heart failure) ([place])   . Complication of anesthesia    MORPHINE CAUSES HALLUCINATIONS  . CVA (cerebral vascular accident) ([place]) 06/2016  . Depression   . FH: colonic polyps 2010  . Gout   . History of kidney stones   . Hypertension   . Kidney cysts   . Morbid obesity ([place])   . Numbness    feet / legs  . PAF (paroxysmal atrial fibrillation) with RVR 07/29/2013  . Sleep apnea    USES C PAP    Past Surgical History:  Procedure Laterality Date  . ARTHROSCOPY KNEE W/ DRILLING    . BREATH TEK H PYLORI N/A 12/23/2012   Procedure: BREATH TEK H PYLORI;  Surgeon: Gayland Curry, MD;  Location: [facility];  Service: General;  Laterality: N/A;  . CHOLECYSTECTOMY  1989  . COLONOSCOPY N/A 07/31/2013   Procedure: COLONOSCOPY;  Surgeon: Gatha Mayer, MD;  Location: [facility];  Service: Endoscopy;  Laterality: N/A;  . ESOPHAGOGASTRODUODENOSCOPY N/A 07/22/2013   Procedure: ESOPHAGOGASTRODUODENOSCOPY (EGD);  Surgeon: Irene Shipper, MD;  Location: [facility] ENDOSCOPY;   Service: Endoscopy;  Laterality: N/A;  . ESOPHAGOGASTRODUODENOSCOPY N/A 07/31/2013   Procedure: ESOPHAGOGASTRODUODENOSCOPY (EGD);  Surgeon: Gatha Mayer, MD;  Location: [facility] ENDOSCOPY;  Service: Endoscopy;  Laterality: N/A;  . INTRAOPERATIVE TRANSESOPHAGEAL ECHOCARDIOGRAM N/A 07/18/2013   Procedure: INTRAOPERATIVE TRANSESOPHAGEAL ECHOCARDIOGRAM;  Surgeon: Grace Isaac, MD;  Location: [facility];  Service: Open Heart Surgery;  Laterality: N/A;  . LAPAROSCOPIC GASTRIC BANDING WITH HIATAL HERNIA REPAIR N/A 04/08/2013   Procedure: LAPAROSCOPIC GASTRIC BANDING WITH possible  HIATAL HERNIA REPAIR;  Surgeon: Gayland Curry, MD;  Location: [facility];  Service: General;  Laterality: N/A;  . LITHOTRIPSY    . renal calculi    . SUBXYPHOID PERICARDIAL WINDOW N/A 07/18/2013   Procedure: SUBXYPHOID PERICARDIAL WINDOW;  Surgeon: Grace Isaac, MD;  Location: [facility];  Service: Thoracic;  Laterality: N/A;  . TONSILLECTOMY    . total knee replacment     bilat  . trigger finger repair     also lue, cts surgically repaired  . WISDOM TOOTH EXTRACTION      There were no vitals filed for this visit.      Subjective Assessment - 08/10/16 1412    Subjective No issues to report, has been performing eye exercises, vision is improving but having diplopia when looking laterally   Pertinent History asthma, basal cell carcinoma, obesity, gout, depression, HTN, OSA, peripheral  neuropathy, CHF, PAF, PE, bil TKA   Limitations Walking   Patient Stated Goals improve walking, balance, wants to drive again and be independent   Currently in Pain? No/denies                          Vestibular Treatment/Exercise - 08/10/16 1419      Vestibular Treatment/Exercise   Vestibular Treatment Provided Gaze   Gaze Exercises X1 Viewing Horizontal;X1 Viewing Vertical;Eye/Head Exercise Horizontal;Eye/Head Exercise Vertical     X1 Viewing Horizontal   Foot Position seated-binocular   Reps 2   Comments 30 seconds with  cues for ROM, speed and to keep letter in focus with decreased ROM     X1 Viewing Vertical   Foot Position seated-binocular   Reps 2   Comments 30 seconds with cues for ROM, speed and to keep letter in focus with decreased ROM     Eye/Head Exercise Horizontal   Foot Position Standing, with RUE support and then no UE support   Reps 2   Comments 1 minute each     Eye/Head Exercise Vertical   Foot Position Standing, with RUE support and then without UE support   Reps 2   Comments 1 minute each            Balance Exercises - 08/10/16 1649      Balance Exercises: Standing   SLS Eyes open;Solid surface;30 secs  L foot on step, weight shifted to R   Retro Gait 2 reps  forwards/retro in // bars with gaze stabilization, no UE            PT Education - 08/10/16 1653    Education provided Yes   Education Details progressed x 1 viewing to binocular viewing and corrective saccades in standing   Person(s) Educated Patient   Methods Explanation;Demonstration   Comprehension Verbalized understanding;Returned demonstration          PT Short Term Goals - 08/08/16 1452      PT SHORT TERM GOAL #1   Title verbalize understanding of CVA risk factors/warning signs (08/10/16)   Status Achieved     PT SHORT TERM GOAL #2   Title improve timed up and go to < 30 sec for improved mobility (08/10/16)   Status Achieved     PT SHORT TERM GOAL #3   Title improve gait velocity to > 1.32 ft/sec for improved mobility and community access (08/10/16)   Status Achieved     PT SHORT TERM GOAL #4   Title amb > 300' on level indoor/paved outdoor surfaces with LRAD with supervision for improved community access (08/10/16)   Status Achieved     PT SHORT TERM GOAL #5   Title perform BERG with appropriate LTG to be written (08/10/16)   Status Achieved           PT Long Term Goals - 07/23/16 1458      PT LONG TERM GOAL #1   Title independent with HEP (09/07/16)   Time 8   Period Weeks    Status On-going     PT LONG TERM GOAL #2   Title improve timed up and go to < 21 sec with LRAD for improved mobility (09/07/16)   Time 8   Period Weeks   Status On-going     PT LONG TERM GOAL #3   Title improve gait velocity to > 1.8 ft/sec for improved mobility and decreased fall risk (09/07/16)  Time 8   Period Weeks   Status On-going     PT LONG TERM GOAL #4   Title amb > 500' on various indoor/outdoor surfaces with LRAD modified independent for improved independence and mobility (09/07/16)   Time 8   Period Weeks   Status On-going     PT LONG TERM GOAL #5   Title Pt will decrease falls risk as indicated by an increase in BERG balance score to >32/56    Baseline 22/56   Time 8   Period Weeks   Status New               Plan - 08/10/16 1700    Clinical Impression Statement Pt demonstrating improvements in vision and able to tolerate x 1 viewing with binocular vision and corrective saccades in standing without UE support and maintain trunk in midline.  Pt also able to tolerate use of upright gaze stabilization during gait forwards and retro without UE support but does not have ability to perform smooth weight shifting and continuous stepping pattern without UE support.  At end of session focused on more normal gait pattern and upright gaze/trunk with UE support on RW.  Pt required two seated rest breaks due to LE fatigue and weakness.   Rehab Potential Good   Clinical Impairments Affecting Rehab Potential multiple comorbidities   PT Frequency 2x / week   PT Duration 8 weeks   PT Treatment/Interventions ADLs/Self Care Home Management;Cryotherapy;Electrical Stimulation;Moist Heat;Neuromuscular re-education;Balance training;Therapeutic exercise;Therapeutic activities;Functional mobility training;Gait training;Stair training;Patient/family education;Orthotic Fit/Training;Manual techniques;Vestibular;Energy conservation   PT Next Visit Plan G-CODE and PROGRESS NOTE DUE!!   Consulted  and Agree with Plan of Care Patient      Patient will benefit from skilled therapeutic intervention in order to improve the following deficits and impairments:  Abnormal gait, Decreased balance, Decreased activity tolerance, Decreased strength, Postural dysfunction, Obesity, Decreased endurance, Decreased knowledge of use of DME, Decreased mobility  Visit Diagnosis: Other abnormalities of gait and mobility  Muscle weakness (generalized)  Unsteadiness on feet  Hemiplegia and hemiparesis following cerebral infarction affecting left non-dominant side [facility])     Problem List Patient Active Problem List   Diagnosis Date Noted  . Benign essential HTN   . 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 Embolic cerebral infarction ([place]) 06/21/2016  . Stroke syndrome ([place])   . OSA (obstructive sleep apnea)   . Dysphagia, post-stroke   . Ataxia, post-stroke   . Neurologic gait disorder   . Encounter for monitoring flecainide therapy 04/28/2016  . Pure hyperglyceridemia 04/26/2016  . Recurrent pulmonary emboli ([place]) 06/13/2015  . GERD (gastroesophageal reflux disease) 06/13/2015  . Asthma 06/13/2015  . Basal cell carcinoma of skin 06/28/2014  . 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 Sinus bradycardia 12/15/2013  . PAF (paroxysmal atrial fibrillation) ([place]) 07/29/2013  . Chronic anticoagulation 07/22/2013  . Gastric ulcer with hemorrhage 07/20/13 07/22/2013  . Diastolic congestive heart failure ([place]) 07/16/2013  . H/O laparoscopic adjustable gastric banding & hiatal hernia repair 04/08/13 04/23/2013  . Prediabetes 02/05/2013  . TMJ (temporomandibular joint syndrome) 09/11/2012  . Morbid obesity ([place]) 08/09/2012  . Peripheral neuropathy ([place]) 11/02/2009  . Hyperlipidemia with target LDL less than 130 09/10/2008  . Depression 09/10/2008  . GOUT 06/28/2006  . BPH (benign prostatic hyperplasia) 06/28/2006   Raylene Everts, PT, DPT 08/10/16    5:05 PM    [facility] [address] [place], [place], [address] Phone: [phone]   Fax:  [phone]  Name: [name] MRN: [id number] Date of Birth: March 10, 1946

## 2016-08-11 ENCOUNTER — Other Ambulatory Visit: Payer: Self-pay | Admitting: Internal Medicine

## 2016-08-14 ENCOUNTER — Ambulatory Visit: Payer: Medicare Other | Admitting: Physical Therapy

## 2016-08-14 ENCOUNTER — Encounter: Payer: Self-pay | Admitting: Physical Therapy

## 2016-08-14 DIAGNOSIS — I69354 Hemiplegia and hemiparesis following cerebral infarction affecting left non-dominant side: Secondary | ICD-10-CM

## 2016-08-14 DIAGNOSIS — M6281 Muscle weakness (generalized): Secondary | ICD-10-CM

## 2016-08-14 DIAGNOSIS — R2681 Unsteadiness on feet: Secondary | ICD-10-CM

## 2016-08-14 DIAGNOSIS — R2689 Other abnormalities of gait and mobility: Secondary | ICD-10-CM | POA: Diagnosis not present

## 2016-08-14 NOTE — Therapy (Signed)
[facility] [address] [place], [place], [address] Phone: [phone]   Fax:  [phone]  Physical Therapy Treatment  Patient Details  Name: WAYMON LASER MRN: [id number] Date of Birth: 1946-01-22 Referring Provider: Dr. Alysia Penna  Encounter Date: 08/14/2016      PT End of Session - 08/14/16 1659    Visit Number 10   Number of Visits 16   Date for PT Re-Evaluation 09/07/16   Authorization Type Medicare-G Codes and Progress Notes   PT Start Time 2202   PT Stop Time 1450   PT Time Calculation (min) 45 min   Activity Tolerance Patient tolerated treatment well   Behavior During Therapy [facility] for tasks assessed/performed      Past Medical History:  Diagnosis Date  . Anemia 07/29/2013  . Asthma    childhood asthma  . Benign prostatic hypertrophy    several prostate biopsies neg for cancer.   . Bilateral hip pain   . Cancer ([place])   . CHF (congestive heart failure) ([place])   . Complication of anesthesia    MORPHINE CAUSES HALLUCINATIONS  . CVA (cerebral vascular accident) ([place]) 06/2016  . Depression   . FH: colonic polyps 2010  . Gout   . History of kidney stones   . Hypertension   . Kidney cysts   . Morbid obesity ([place])   . Numbness    feet / legs  . PAF (paroxysmal atrial fibrillation) with RVR 07/29/2013  . Sleep apnea    USES C PAP    Past Surgical History:  Procedure Laterality Date  . ARTHROSCOPY KNEE W/ DRILLING    . BREATH TEK H PYLORI N/A 12/23/2012   Procedure: BREATH TEK H PYLORI;  Surgeon: Gayland Curry, MD;  Location: [facility];  Service: General;  Laterality: N/A;  . CHOLECYSTECTOMY  1989  . COLONOSCOPY N/A 07/31/2013   Procedure: COLONOSCOPY;  Surgeon: Gatha Mayer, MD;  Location: [facility];  Service: Endoscopy;  Laterality: N/A;  . ESOPHAGOGASTRODUODENOSCOPY N/A 07/22/2013   Procedure: ESOPHAGOGASTRODUODENOSCOPY (EGD);  Surgeon: Irene Shipper, MD;  Location: [facility] ENDOSCOPY;   Service: Endoscopy;  Laterality: N/A;  . ESOPHAGOGASTRODUODENOSCOPY N/A 07/31/2013   Procedure: ESOPHAGOGASTRODUODENOSCOPY (EGD);  Surgeon: Gatha Mayer, MD;  Location: [facility] ENDOSCOPY;  Service: Endoscopy;  Laterality: N/A;  . INTRAOPERATIVE TRANSESOPHAGEAL ECHOCARDIOGRAM N/A 07/18/2013   Procedure: INTRAOPERATIVE TRANSESOPHAGEAL ECHOCARDIOGRAM;  Surgeon: Grace Isaac, MD;  Location: [facility];  Service: Open Heart Surgery;  Laterality: N/A;  . LAPAROSCOPIC GASTRIC BANDING WITH HIATAL HERNIA REPAIR N/A 04/08/2013   Procedure: LAPAROSCOPIC GASTRIC BANDING WITH possible  HIATAL HERNIA REPAIR;  Surgeon: Gayland Curry, MD;  Location: [facility];  Service: General;  Laterality: N/A;  . LITHOTRIPSY    . renal calculi    . SUBXYPHOID PERICARDIAL WINDOW N/A 07/18/2013   Procedure: SUBXYPHOID PERICARDIAL WINDOW;  Surgeon: Grace Isaac, MD;  Location: [facility];  Service: Thoracic;  Laterality: N/A;  . TONSILLECTOMY    . total knee replacment     bilat  . trigger finger repair     also lue, cts surgically repaired  . WISDOM TOOTH EXTRACTION      There were no vitals filed for this visit.      Subjective Assessment - 08/14/16 1409    Subjective No issues to report from the weekend.  Has been performing adaptation exercises in sitting and standing, no issues.  Still experiencing LOB when turning to the R.   Patient is  accompained by: Family member   Pertinent History asthma, basal cell carcinoma, obesity, gout, depression, HTN, OSA, peripheral neuropathy, CHF, PAF, PE, bil TKA   Limitations Walking   Patient Stated Goals improve walking, balance, wants to drive again and be independent   Currently in Pain? No/denies            [facility] PT Assessment - 08/14/16 1410      Standardized Balance Assessment   Standardized Balance Assessment Berg Balance Test     Berg Balance Test   Sit to Stand Able to stand without using hands and stabilize independently   Standing Unsupported Able to stand 2 minutes  with supervision   Sitting with Back Unsupported but Feet Supported on Floor or Stool Able to sit safely and securely 2 minutes   Stand to Sit Controls descent by using hands   Transfers Able to transfer safely, definite need of hands   Standing Unsupported with Eyes Closed Able to stand 10 seconds with supervision   Standing Ubsupported with Feet Together Able to place feet together independently and stand for 1 minute with supervision   From Standing, Reach Forward with Outstretched Arm Can reach confidently >25 cm (10")   From Standing Position, Pick up Object from Floor Able to pick up shoe, needs supervision   From Standing Position, Turn to Look Behind Over each Shoulder Needs supervision when turning   Turn 360 Degrees Needs close supervision or verbal cueing   Standing Unsupported, Alternately Place Feet on Step/Stool Able to complete >2 steps/needs minimal assist   Standing Unsupported, One Foot in Front Able to take small step independently and hold 30 seconds   Standing on One Leg Tries to lift leg/unable to hold 3 seconds but remains standing independently   Total Score 36   Berg comment: 36/56                          Balance Exercises - 08/14/16 1657      Balance Exercises: Standing   Standing Eyes Opened Narrow base of support (BOS);Head turns;Solid surface  R and L partial tandem stance   SLS Eyes open;Solid surface;Upper extremity support 1;2 reps;10 secs   Sidestepping Upper extremity support;4 reps   Turning Right;Left;5 reps  one UE support on counter top, turn every 10 steps           PT Education - 08/14/16 1659    Education provided Yes   Education Details standing balance HEP, re-assessment of BERG-progress made but areas of impairment to continue to focus on   Person(s) Educated Patient;Spouse   Methods Explanation;Demonstration;Handout   Comprehension Verbalized understanding;Returned demonstration     SINGLE LIMB  STANCE    Stance: single leg on floor. Raise leg. Hold __10_ seconds. Repeat with other leg. Hold on to counter top with one hand _2__ reps per set, _2__ sets per day, __5_ days per week  Side-Stepping    Walk to left side with eyes open. Take even steps, leading with same foot. Make sure each foot lifts off the floor. Repeat in opposite direction holding on to countertop lightly, look up! Repeat for __4 laps__  Do __2__ sessions per day.   Forward Progression With 180 (Half) Turns    Walk making a slow half turn in place, leading with head and eyes, toward right and then left (one hand on counter, turn towards counter each time) every __10__ steps. Walk in a straight path forward between  turns. Repeat sequence __4__ times per session. Do __2__ sessions per day.   Feet Partial Heel-Toe, Head Motion - Eyes Open    With eyes open, right foot partially in front of the other, move head slowly: up and down 10 times, side to side 10 times, chair in front for support.  Place other foot forwards and repeat. Repeat 1 times per session. Do 2 sessions per day.     PT Short Term Goals - 08/08/16 1452      PT SHORT TERM GOAL #1   Title verbalize understanding of CVA risk factors/warning signs (08/10/16)   Status Achieved     PT SHORT TERM GOAL #2   Title improve timed up and go to < 30 sec for improved mobility (08/10/16)   Status Achieved     PT SHORT TERM GOAL #3   Title improve gait velocity to > 1.32 ft/sec for improved mobility and community access (08/10/16)   Status Achieved     PT SHORT TERM GOAL #4   Title amb > 300' on level indoor/paved outdoor surfaces with LRAD with supervision for improved community access (08/10/16)   Status Achieved     PT SHORT TERM GOAL #5   Title perform BERG with appropriate LTG to be written (08/10/16)   Status Achieved           PT Long Term Goals - 08/14/16 1700      PT LONG TERM GOAL #1   Title independent with HEP (09/07/16)    Time 8   Period Weeks   Status On-going     PT LONG TERM GOAL #2   Title improve timed up and go to < 21 sec with LRAD for improved mobility (09/07/16)   Time 8   Period Weeks   Status On-going     PT LONG TERM GOAL #3   Title improve gait velocity to > 1.8 ft/sec for improved mobility and decreased fall risk (09/07/16)   Time 8   Period Weeks   Status On-going     PT LONG TERM GOAL #4   Title amb > 500' on various indoor/outdoor surfaces with LRAD modified independent for improved independence and mobility (09/07/16)   Time 8   Period Weeks   Status On-going     PT LONG TERM GOAL #5   Title Pt will decrease falls risk as indicated by an increase in BERG balance score to > or = 40/56 (goal revised on 08/14/2016)   Baseline 36/56; goal upgraded due to progress   Time 8   Period Weeks   Status Revised               Plan - 08/14/16 1701    Clinical Impression Statement Pt continues to demonstrate improvements in midline orientation, balance, dizziness, safety with gait and decreasing falls risk.  Performed re-assessment of Berg balance assessment with improvement to 36/56-still at high risk for falls but pt surpassed LTG-LTG upgraded due to progress.  Due to progress with balance pt now safe to initiate standing balance HEP at home; provided pt with balance exercises and educated pt to perform them only when wife is present to supervise.     Rehab Potential Good   Clinical Impairments Affecting Rehab Potential multiple comorbidities   PT Frequency 2x / week   PT Duration 8 weeks   PT Treatment/Interventions ADLs/Self Care Home Management;Cryotherapy;Electrical Stimulation;Moist Heat;Neuromuscular re-education;Balance training;Therapeutic exercise;Therapeutic activities;Functional mobility training;Gait training;Stair training;Patient/family education;Orthotic Fit/Training;Manual techniques;Vestibular;Energy conservation   PT Next  Visit Plan Review new balance exercises/HEP, adjust  if necessary.  Continue to focus on weight shifting, midline orientation, gait with more upright gaze, turning, stairs with decreased UE support   Consulted and Agree with Plan of Care Patient   Family Member Consulted spouse      Patient will benefit from skilled therapeutic intervention in order to improve the following deficits and impairments:  Abnormal gait, Decreased balance, Decreased activity tolerance, Decreased strength, Postural dysfunction, Obesity, Decreased endurance, Decreased knowledge of use of DME, Decreased mobility  Visit Diagnosis: Other abnormalities of gait and mobility  Muscle weakness (generalized)  Unsteadiness on feet  Hemiplegia and hemiparesis following cerebral infarction affecting left non-dominant side (HCC)       G-Codes - 08-20-2016 1706    Functional Assessment Tool Used (Outpatient Only) BERG 36/56   Functional Limitation Mobility: Walking and moving around   Mobility: Walking and Moving Around Current Status (L2751) At least 20 percent but less than 40 percent impaired, limited or restricted   Mobility: Walking and Moving Around Goal Status [id number]) At least 20 percent but less than 40 percent impaired, limited or restricted      Problem List Patient Active Problem List   Diagnosis Date Noted  . Benign essential HTN   . 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 Embolic cerebral infarction ([place]) 06/21/2016  . Stroke syndrome ([place])   . OSA (obstructive sleep apnea)   . Dysphagia, post-stroke   . Ataxia, post-stroke   . Neurologic gait disorder   . Encounter for monitoring flecainide therapy 04/28/2016  . Pure hyperglyceridemia 04/26/2016  . Recurrent pulmonary emboli ([place]) 06/13/2015  . GERD (gastroesophageal reflux disease) 06/13/2015  . Asthma 06/13/2015  . Basal cell carcinoma of skin 06/28/2014  . 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 Sinus bradycardia 12/15/2013  . PAF (paroxysmal atrial fibrillation) ([place]) 07/29/2013  .  Chronic anticoagulation 07/22/2013  . Gastric ulcer with hemorrhage 07/20/13 07/22/2013  . Diastolic congestive heart failure ([place]) 07/16/2013  . H/O laparoscopic adjustable gastric banding & hiatal hernia repair 04/08/13 04/23/2013  . Prediabetes 02/05/2013  . TMJ (temporomandibular joint syndrome) 09/11/2012  . Morbid obesity ([place]) 08/09/2012  . Peripheral neuropathy ([place]) 11/02/2009  . Hyperlipidemia with target LDL less than 130 09/10/2008  . Depression 09/10/2008  . GOUT 06/28/2006  . BPH (benign prostatic hyperplasia) 06/28/2006   Physical Therapy Progress Note  Dates of Reporting Period: 07/13/2016 to 08-20-16  Objective Reports of Subjective Statement: See above  Objective Measurements: BERG 36/56  Goal Update: BERG goal updated; see above  Plan: See above  Reason Skilled Services are Required: to continue to address dizziness, impaired postural control, balance, gait and to decrease falls risk  Raylene Everts, PT, DPT 20-Aug-2016    5:09 PM    [facility] [address] [place], [place], [address] Phone: [phone]   Fax:  [phone]  Name: RUTGER SALTON MRN: [id number] Date of Birth: 1945/11/03

## 2016-08-14 NOTE — Patient Instructions (Signed)
SINGLE LIMB STANCE    Stance: single leg on floor. Raise leg. Hold __10_ seconds. Repeat with other leg. Hold on to counter top with one hand _2__ reps per set, _2__ sets per day, __5_ days per week  Side-Stepping    Walk to left side with eyes open. Take even steps, leading with same foot. Make sure each foot lifts off the floor. Repeat in opposite direction holding on to countertop lightly, look up! Repeat for __4 laps__  Do __2__ sessions per day.   Forward Progression With 180 (Half) Turns    Walk making a slow half turn in place, leading with head and eyes, toward right and then left (one hand on counter, turn towards counter each time) every __10__ steps. Walk in a straight path forward between turns. Repeat sequence __4__ times per session. Do __2__ sessions per day.   Feet Partial Heel-Toe, Head Motion - Eyes Open    With eyes open, right foot partially in front of the other, move head slowly: up and down 10 times, side to side 10 times, chair in front for support.  Place other foot forwards and repeat. Repeat 1 times per session. Do 2 sessions per day.

## 2016-08-17 ENCOUNTER — Other Ambulatory Visit: Payer: Self-pay | Admitting: Physical Medicine & Rehabilitation

## 2016-08-17 NOTE — Telephone Encounter (Signed)
Recieved electronic medication refill request for breo ellipta, no mention in previous notes to continue this medication, please advise

## 2016-08-18 ENCOUNTER — Encounter: Payer: Self-pay | Admitting: Physical Therapy

## 2016-08-18 ENCOUNTER — Ambulatory Visit: Payer: Medicare Other | Admitting: Physical Therapy

## 2016-08-18 DIAGNOSIS — I69354 Hemiplegia and hemiparesis following cerebral infarction affecting left non-dominant side: Secondary | ICD-10-CM | POA: Diagnosis not present

## 2016-08-18 DIAGNOSIS — M6281 Muscle weakness (generalized): Secondary | ICD-10-CM | POA: Diagnosis not present

## 2016-08-18 DIAGNOSIS — R2689 Other abnormalities of gait and mobility: Secondary | ICD-10-CM

## 2016-08-18 DIAGNOSIS — R2681 Unsteadiness on feet: Secondary | ICD-10-CM | POA: Diagnosis not present

## 2016-08-19 NOTE — Therapy (Signed)
[facility] [address] [place], [place], [address] Phone: [phone]   Fax:  [phone]  Physical Therapy Treatment  Patient Details  Name: [name] MRN: [id number] Date of Birth: [birth year] Referring Provider: Dr. Alysia Penna  Encounter Date: 08/18/2016      PT End of Session - 08/18/16 1406    Visit Number 11   Number of Visits 16   Date for PT Re-Evaluation 09/07/16   Authorization Type Medicare-G Codes and Progress Notes   PT Start Time 4008   PT Stop Time 1445   PT Time Calculation (min) 42 min   Activity Tolerance Patient tolerated treatment well   Behavior During Therapy [facility] for tasks assessed/performed      Past Medical History:  Diagnosis Date  . Anemia 07/29/2013  . Asthma    childhood asthma  . Benign prostatic hypertrophy    several prostate biopsies neg for cancer.   . Bilateral hip pain   . Cancer ([place])   . CHF (congestive heart failure) ([place])   . Complication of anesthesia    MORPHINE CAUSES HALLUCINATIONS  . CVA (cerebral vascular accident) ([place]) 06/2016  . Depression   . FH: colonic polyps 2010  . Gout   . History of kidney stones   . Hypertension   . Kidney cysts   . Morbid obesity ([place])   . Numbness    feet / legs  . PAF (paroxysmal atrial fibrillation) with RVR 07/29/2013  . Sleep apnea    USES C PAP    Past Surgical History:  Procedure Laterality Date  . ARTHROSCOPY KNEE W/ DRILLING    . BREATH TEK H PYLORI N/A 12/23/2012   Procedure: BREATH TEK H PYLORI;  Surgeon: Gayland Curry, MD;  Location: [facility];  Service: General;  Laterality: N/A;  . CHOLECYSTECTOMY  1989  . COLONOSCOPY N/A 07/31/2013   Procedure: COLONOSCOPY;  Surgeon: Gatha Mayer, MD;  Location: [facility];  Service: Endoscopy;  Laterality: N/A;  . ESOPHAGOGASTRODUODENOSCOPY N/A 07/22/2013   Procedure: ESOPHAGOGASTRODUODENOSCOPY (EGD);  Surgeon: Irene Shipper, MD;  Location: [facility] ENDOSCOPY;   Service: Endoscopy;  Laterality: N/A;  . ESOPHAGOGASTRODUODENOSCOPY N/A 07/31/2013   Procedure: ESOPHAGOGASTRODUODENOSCOPY (EGD);  Surgeon: Gatha Mayer, MD;  Location: [facility] ENDOSCOPY;  Service: Endoscopy;  Laterality: N/A;  . INTRAOPERATIVE TRANSESOPHAGEAL ECHOCARDIOGRAM N/A 07/18/2013   Procedure: INTRAOPERATIVE TRANSESOPHAGEAL ECHOCARDIOGRAM;  Surgeon: Grace Isaac, MD;  Location: [facility];  Service: Open Heart Surgery;  Laterality: N/A;  . LAPAROSCOPIC GASTRIC BANDING WITH HIATAL HERNIA REPAIR N/A 04/08/2013   Procedure: LAPAROSCOPIC GASTRIC BANDING WITH possible  HIATAL HERNIA REPAIR;  Surgeon: Gayland Curry, MD;  Location: [facility];  Service: General;  Laterality: N/A;  . LITHOTRIPSY    . renal calculi    . SUBXYPHOID PERICARDIAL WINDOW N/A 07/18/2013   Procedure: SUBXYPHOID PERICARDIAL WINDOW;  Surgeon: Grace Isaac, MD;  Location: [facility];  Service: Thoracic;  Laterality: N/A;  . TONSILLECTOMY    . total knee replacment     bilat  . trigger finger repair     also lue, cts surgically repaired  . WISDOM TOOTH EXTRACTION      There were no vitals filed for this visit.      Subjective Assessment - 08/18/16 1405    Subjective No new complaints. No falls or pain to report. Does report LE and back fatigue with activity.   Patient is accompained by: Family member   Pertinent History asthma, basal cell  carcinoma, obesity, gout, depression, HTN, OSA, peripheral neuropathy, CHF, PAF, PE, bil TKA   Limitations Walking   Patient Stated Goals improve walking, balance, wants to drive again and be independent   Currently in Pain? No/denies   Pain Score 0-No pain     treatment: Pt performed HEP issued at previous session with minimal cues on correct ex form/technique needed.       [facility] Adult PT Treatment/Exercise - 08/18/16 1424      Transfers   Transfers Sit to Stand;Stand to Sit     Ambulation/Gait   Ambulation/Gait Yes   Ambulation/Gait Assistance 5: Supervision   Ambulation  Distance (Feet) 100 Feet  x2, plus around gym    Assistive device Rolling walker   Gait Pattern Step-through pattern;Decreased stride length;Decreased step length - right;Decreased step length - left;Decreased trunk rotation;Trunk flexed;Narrow base of support  heavy UE reliance on RW with gait   Ambulation Surface Level;Indoor     High Level Balance   High Level Balance Activities Marching forwards;Marching backwards;Tandem walking   High Level Balance Comments in parallel bars with single UE support x 3 laps each with min guard assit. cues on posture and ex form.      Neuro Re-ed    Neuro Re-ed Details  in parallel bars: with single UE support on bars alternating fwd toe taps x 10 each, then alternating cross toe taps x 10 reps each to 4 inch box, progressing to light fingertip touch with bil UE's for fwd toe taps x 10 reps bil legs. min guard to min assist for balance with cues on posture, stance position and weight shifting.                                     PT Short Term Goals - 08/08/16 1452      PT SHORT TERM GOAL #1   Title verbalize understanding of CVA risk factors/warning signs (08/10/16)   Status Achieved     PT SHORT TERM GOAL #2   Title improve timed up and go to < 30 sec for improved mobility (08/10/16)   Status Achieved     PT SHORT TERM GOAL #3   Title improve gait velocity to > 1.32 ft/sec for improved mobility and community access (08/10/16)   Status Achieved     PT SHORT TERM GOAL #4   Title amb > 300' on level indoor/paved outdoor surfaces with LRAD with supervision for improved community access (08/10/16)   Status Achieved     PT SHORT TERM GOAL #5   Title perform BERG with appropriate LTG to be written (08/10/16)   Status Achieved           PT Long Term Goals - 08/14/16 1700      PT LONG TERM GOAL #1   Title independent with HEP (09/07/16)   Time 8   Period Weeks   Status On-going     PT LONG TERM GOAL #2   Title improve timed up and go to <  21 sec with LRAD for improved mobility (09/07/16)   Time 8   Period Weeks   Status On-going     PT LONG TERM GOAL #3   Title improve gait velocity to > 1.8 ft/sec for improved mobility and decreased fall risk (09/07/16)   Time 8   Period Weeks   Status On-going     PT LONG TERM GOAL #4  Title amb > 500' on various indoor/outdoor surfaces with LRAD modified independent for improved independence and mobility (09/07/16)   Time 8   Period Weeks   Status On-going     PT LONG TERM GOAL #5   Title Pt will decrease falls risk as indicated by an increase in BERG balance score to > or = 40/56 (goal revised on 08/14/2016)   Baseline 36/56; goal upgraded due to progress   Time 8   Period Weeks   Status Revised            Plan - 08/18/16 1407    Clinical Impression Statement today's skilled session continued to focus on balance and also on decreased UE reliance with gait/activities. Pt is making steady progress and should benefit from continued PT to progress toward unmet goals.    Rehab Potential Good   Clinical Impairments Affecting Rehab Potential multiple comorbidities   PT Frequency 2x / week   PT Duration 8 weeks   PT Treatment/Interventions ADLs/Self Care Home Management;Cryotherapy;Electrical Stimulation;Moist Heat;Neuromuscular re-education;Balance training;Therapeutic exercise;Therapeutic activities;Functional mobility training;Gait training;Stair training;Patient/family education;Orthotic Fit/Training;Manual techniques;Vestibular;Energy conservation   PT Next Visit Plan Continue to focus on weight shifting, midline orientation, gait with more upright gaze, turning, stairs with decreased UE support   Consulted and Agree with Plan of Care Patient   Family Member Consulted spouse      Patient will benefit from skilled therapeutic intervention in order to improve the following deficits and impairments:  Abnormal gait, Decreased balance, Decreased activity tolerance, Decreased strength,  Postural dysfunction, Obesity, Decreased endurance, Decreased knowledge of use of DME, Decreased mobility  Visit Diagnosis: Other abnormalities of gait and mobility  Muscle weakness (generalized)  Unsteadiness on feet  Hemiplegia and hemiparesis following cerebral infarction affecting left non-dominant side [facility])     Problem List Patient Active Problem List   Diagnosis Date Noted  . Benign essential HTN   . 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 Embolic cerebral infarction ([place]) 06/21/2016  . Stroke syndrome ([place])   . OSA (obstructive sleep apnea)   . Dysphagia, post-stroke   . Ataxia, post-stroke   . Neurologic gait disorder   . Encounter for monitoring flecainide therapy 04/28/2016  . Pure hyperglyceridemia 04/26/2016  . Recurrent pulmonary emboli ([place]) 06/13/2015  . GERD (gastroesophageal reflux disease) 06/13/2015  . Asthma 06/13/2015  . Basal cell carcinoma of skin 06/28/2014  . 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 Sinus bradycardia 12/15/2013  . PAF (paroxysmal atrial fibrillation) ([place]) 07/29/2013  . Chronic anticoagulation 07/22/2013  . Gastric ulcer with hemorrhage 07/20/13 07/22/2013  . Diastolic congestive heart failure ([place]) 07/16/2013  . H/O laparoscopic adjustable gastric banding & hiatal hernia repair 04/08/13 04/23/2013  . Prediabetes 02/05/2013  . TMJ (temporomandibular joint syndrome) 09/11/2012  . Morbid obesity ([place]) 08/09/2012  . Peripheral neuropathy ([place]) 11/02/2009  . Hyperlipidemia with target LDL less than 130 09/10/2008  . Depression 09/10/2008  . GOUT 06/28/2006  . BPH (benign prostatic hyperplasia) 06/28/2006    Willow Ora, PTA, [facility] Neuro [facility] [address] [place], [place] [address] [phone] 08/19/16, 4:47 PM   Name: [name] MRN: [id number] Date of Birth: [birth year]

## 2016-08-21 ENCOUNTER — Encounter: Payer: Self-pay | Admitting: Physical Therapy

## 2016-08-21 ENCOUNTER — Ambulatory Visit: Payer: Medicare Other | Admitting: Physical Therapy

## 2016-08-21 DIAGNOSIS — R2681 Unsteadiness on feet: Secondary | ICD-10-CM | POA: Diagnosis not present

## 2016-08-21 DIAGNOSIS — M6281 Muscle weakness (generalized): Secondary | ICD-10-CM

## 2016-08-21 DIAGNOSIS — R2689 Other abnormalities of gait and mobility: Secondary | ICD-10-CM | POA: Diagnosis not present

## 2016-08-21 DIAGNOSIS — I69354 Hemiplegia and hemiparesis following cerebral infarction affecting left non-dominant side: Secondary | ICD-10-CM | POA: Diagnosis not present

## 2016-08-22 NOTE — Therapy (Signed)
[facility] [address] [place], [place], [address] Phone: [phone]   Fax:  [phone]  Physical Therapy Treatment  Patient Details  Name: [name] MRN: [id number] Date of Birth: [birth year] Referring Provider: Dr. Alysia Penna  Encounter Date: 08/21/2016      PT End of Session - 08/22/16 0706    Visit Number 12   Number of Visits 16   Date for PT Re-Evaluation 09/07/16   Authorization Type Medicare-G Codes and Progress Notes   PT Start Time 1407   PT Stop Time 1447   PT Time Calculation (min) 40 min   Activity Tolerance Patient tolerated treatment well   Behavior During Therapy [facility] for tasks assessed/performed      Past Medical History:  Diagnosis Date  . Anemia 07/29/2013  . Asthma    childhood asthma  . Benign prostatic hypertrophy    several prostate biopsies neg for cancer.   . Bilateral hip pain   . Cancer ([place])   . CHF (congestive heart failure) ([place])   . Complication of anesthesia    MORPHINE CAUSES HALLUCINATIONS  . CVA (cerebral vascular accident) ([place]) 06/2016  . Depression   . FH: colonic polyps 2010  . Gout   . History of kidney stones   . Hypertension   . Kidney cysts   . Morbid obesity ([place])   . Numbness    feet / legs  . PAF (paroxysmal atrial fibrillation) with RVR 07/29/2013  . Sleep apnea    USES C PAP    Past Surgical History:  Procedure Laterality Date  . ARTHROSCOPY KNEE W/ DRILLING    . BREATH TEK H PYLORI N/A 12/23/2012   Procedure: BREATH TEK H PYLORI;  Surgeon: Gayland Curry, MD;  Location: [facility];  Service: General;  Laterality: N/A;  . CHOLECYSTECTOMY  1989  . COLONOSCOPY N/A 07/31/2013   Procedure: COLONOSCOPY;  Surgeon: Gatha Mayer, MD;  Location: [place];  Service: Endoscopy;  Laterality: N/A;  . ESOPHAGOGASTRODUODENOSCOPY N/A 07/22/2013   Procedure: ESOPHAGOGASTRODUODENOSCOPY (EGD);  Surgeon: Irene Shipper, MD;  Location: [facility]OSCOPY;   Service: Endoscopy;  Laterality: N/A;  . ESOPHAGOGASTRODUODENOSCOPY N/A 07/31/2013   Procedure: ESOPHAGOGASTRODUODENOSCOPY (EGD);  Surgeon: Gatha Mayer, MD;  Location: [facility] ENDOSCOPY;  Service: Endoscopy;  Laterality: N/A;  . INTRAOPERATIVE TRANSESOPHAGEAL ECHOCARDIOGRAM N/A 07/18/2013   Procedure: INTRAOPERATIVE TRANSESOPHAGEAL ECHOCARDIOGRAM;  Surgeon: Grace Isaac, MD;  Location: [facility];  Service: Open Heart Surgery;  Laterality: N/A;  . LAPAROSCOPIC GASTRIC BANDING WITH HIATAL HERNIA REPAIR N/A 04/08/2013   Procedure: LAPAROSCOPIC GASTRIC BANDING WITH possible  HIATAL HERNIA REPAIR;  Surgeon: Gayland Curry, MD;  Location: [facility];  Service: General;  Laterality: N/A;  . LITHOTRIPSY    . renal calculi    . SUBXYPHOID PERICARDIAL WINDOW N/A 07/18/2013   Procedure: SUBXYPHOID PERICARDIAL WINDOW;  Surgeon: Grace Isaac, MD;  Location: [facility];  Service: Thoracic;  Laterality: N/A;  . TONSILLECTOMY    . total knee replacment     bilat  . trigger finger repair     also lue, cts surgically repaired  . WISDOM TOOTH EXTRACTION      There were no vitals filed for this visit.      Subjective Assessment - 08/21/16 1413    Subjective No issues to report, no falls.  Exercises going well, vision remains the same.  Pt continues to notice lack of temperature sensation or hypersensitivity to temperature when in the shower.  Patient is accompained by: Family member   Pertinent History asthma, basal cell carcinoma, obesity, gout, depression, HTN, OSA, peripheral neuropathy, CHF, PAF, PE, bil TKA   Limitations Walking   Patient Stated Goals improve walking, balance, wants to drive again and be independent   Currently in Pain? No/denies                         [facility] Adult PT Treatment/Exercise - 08/21/16 1415      Ambulation/Gait   Ambulation/Gait Yes   Ambulation/Gait Assistance 4: Min assist   Ambulation/Gait Assistance Details performed gait assessment on indoor surfaces  with cane with quad tip; pt able to demonstrate improved upright posture, weight shift and bilateral step length on straight paths but returned to step to gait pattern with decreased foot clearance during changes in direction to L and R.  Pt asking if he could begin to ambulate in his home with his quad cane; advised pt to bring his cane to next session to assess and practice before given clearance to use in the home.   Ambulation Distance (Feet) 115 Feet   Assistive device Straight cane   Gait Pattern Step-to pattern;Step-through pattern;Decreased step length - right;Decreased step length - left;Decreased stance time - right;Decreased stride length;Decreased weight shift to right;Lateral trunk lean to left;Trunk flexed   Ambulation Surface Level;Indoor   Stairs Yes   Stairs Assistance 4: Min guard  verbal cues not to pull with UE   Stair Management Technique Two rails;Alternating pattern;Forwards   Number of Stairs 8   Height of Stairs 6     Exercises   Exercises Knee/Hip     Knee/Hip Exercises: Standing   Lateral Step Up Right;10 reps;Hand Hold: 2;Step Height: 6"   Forward Step Up Right;Left;10 reps;Hand Hold: 2;Step Height: 6"   Step Down Right;Left;10 reps;Hand Hold: 2;Step Height: 6"                PT Education - 08/22/16 0706    Education provided Yes   Education Details to bring quad cane to next session to assess safety with gait for possible use in the home   Person(s) Educated Patient;Spouse   Methods Explanation   Comprehension Verbalized understanding          PT Short Term Goals - 08/08/16 1452      PT SHORT TERM GOAL #1   Title verbalize understanding of CVA risk factors/warning signs (08/10/16)   Status Achieved     PT SHORT TERM GOAL #2   Title improve timed up and go to < 30 sec for improved mobility (08/10/16)   Status Achieved     PT SHORT TERM GOAL #3   Title improve gait velocity to > 1.32 ft/sec for improved mobility and community access  (08/10/16)   Status Achieved     PT SHORT TERM GOAL #4   Title amb > 300' on level indoor/paved outdoor surfaces with LRAD with supervision for improved community access (08/10/16)   Status Achieved     PT SHORT TERM GOAL #5   Title perform BERG with appropriate LTG to be written (08/10/16)   Status Achieved           PT Long Term Goals - 08/14/16 1700      PT LONG TERM GOAL #1   Title independent with HEP (09/07/16)   Time 8   Period Weeks   Status On-going     PT LONG TERM GOAL #2  Title improve timed up and go to < 21 sec with LRAD for improved mobility (09/07/16)   Time 8   Period Weeks   Status On-going     PT LONG TERM GOAL #3   Title improve gait velocity to > 1.8 ft/sec for improved mobility and decreased fall risk (09/07/16)   Time 8   Period Weeks   Status On-going     PT LONG TERM GOAL #4   Title amb > 500' on various indoor/outdoor surfaces with LRAD modified independent for improved independence and mobility (09/07/16)   Time 8   Period Weeks   Status On-going     PT LONG TERM GOAL #5   Title Pt will decrease falls risk as indicated by an increase in BERG balance score to > or = 40/56 (goal revised on 08/14/2016)   Baseline 36/56; goal upgraded due to progress   Time 8   Period Weeks   Status Revised               Plan - 08/22/16 0707    Clinical Impression Statement Today's treatment session focused on progression of stair and gait training and continued endurance, dynamic balance, weight shift and postural control training.  Initiated gait training with cane on indoor, level surfaces with goal to progress pt to use of cane within the home.  Use of alternating sequence, forward, lateral step ups and forward step downs to continue to focus on weight shifting in various directions, single limb stance and strengthening and full step length and advancement of contralateral LE.  Throughout session pt required multiple sitting rest breaks due to SOB.  Will  continue to address and progress towards LTG.     Rehab Potential Good   Clinical Impairments Affecting Rehab Potential multiple comorbidities   PT Treatment/Interventions ADLs/Self Care Home Management;Cryotherapy;Electrical Stimulation;Moist Heat;Neuromuscular re-education;Balance training;Therapeutic exercise;Therapeutic activities;Functional mobility training;Gait training;Stair training;Patient/family education;Orthotic Fit/Training;Manual techniques;Vestibular;Energy conservation   PT Next Visit Plan Pt to bring his quad cane from home; please assess gait and practice with quad cane to work towards pt using at home; Continue to focus on weight shifting, midline orientation, gait with more upright gaze, turning, stairs with decreased UE support   Consulted and Agree with Plan of Care Patient;Family member/caregiver   Family Member Consulted wife      Patient will benefit from skilled therapeutic intervention in order to improve the following deficits and impairments:  Abnormal gait, Decreased balance, Decreased activity tolerance, Decreased strength, Postural dysfunction, Obesity, Decreased endurance, Decreased knowledge of use of DME, Decreased mobility  Visit Diagnosis: Other abnormalities of gait and mobility  Muscle weakness (generalized)  Unsteadiness on feet  Hemiplegia and hemiparesis following cerebral infarction affecting left non-dominant side [facility])     Problem List Patient Active Problem List   Diagnosis Date Noted  . Benign essential HTN   . 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 Embolic cerebral infarction ([place]) 06/21/2016  . Stroke syndrome ([place])   . OSA (obstructive sleep apnea)   . Dysphagia, post-stroke   . Ataxia, post-stroke   . Neurologic gait disorder   . Encounter for monitoring flecainide therapy 04/28/2016  . Pure hyperglyceridemia 04/26/2016  . Recurrent pulmonary emboli ([place]) 06/13/2015  . GERD (gastroesophageal reflux disease) 06/13/2015  . Asthma  06/13/2015  . Basal cell carcinoma of skin 06/28/2014  . 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 Sinus bradycardia 12/15/2013  . PAF (paroxysmal atrial fibrillation) ([place]) 07/29/2013  . Chronic anticoagulation 07/22/2013  . Gastric ulcer with  hemorrhage 07/20/13 07/22/2013  . Diastolic congestive heart failure ([place]) 07/16/2013  . H/O laparoscopic adjustable gastric banding & hiatal hernia repair 04/08/13 04/23/2013  . Prediabetes 02/05/2013  . TMJ (temporomandibular joint syndrome) 09/11/2012  . Morbid obesity ([place]) 08/09/2012  . Peripheral neuropathy ([place]) 11/02/2009  . Hyperlipidemia with target LDL less than 130 09/10/2008  . Depression 09/10/2008  . GOUT 06/28/2006  . BPH (benign prostatic hyperplasia) 06/28/2006   Raylene Everts, PT, DPT 08/22/16    7:17 AM    [facility] [address] [place], [place], [address] Phone: [phone]   Fax:  [phone]  Name: AJAY STRUBEL MRN: [id number] Date of Birth: 02-Jun-1945

## 2016-08-23 ENCOUNTER — Ambulatory Visit: Payer: Medicare Other | Admitting: Physical Therapy

## 2016-08-25 ENCOUNTER — Ambulatory Visit: Payer: Medicare Other | Admitting: Physical Therapy

## 2016-08-25 ENCOUNTER — Encounter: Payer: Self-pay | Admitting: Physical Therapy

## 2016-08-25 DIAGNOSIS — M6281 Muscle weakness (generalized): Secondary | ICD-10-CM

## 2016-08-25 DIAGNOSIS — R2689 Other abnormalities of gait and mobility: Secondary | ICD-10-CM

## 2016-08-25 DIAGNOSIS — R2681 Unsteadiness on feet: Secondary | ICD-10-CM | POA: Diagnosis not present

## 2016-08-25 DIAGNOSIS — I69354 Hemiplegia and hemiparesis following cerebral infarction affecting left non-dominant side: Secondary | ICD-10-CM

## 2016-08-25 NOTE — Therapy (Signed)
[facility] [address] [place], [place], [address] Phone: [phone]   Fax:  [phone]  Physical Therapy Treatment  Patient Details  Name: [name] MRN: [id number] Date of Birth: [birth year] Referring Provider: Dr. Alysia Penna  Encounter Date: 08/25/2016      PT End of Session - 08/25/16 1323    Visit Number 13   Number of Visits 16   Date for PT Re-Evaluation 09/07/16   Authorization Type Medicare-G Codes and Progress Notes   PT Start Time 2355   PT Stop Time 1400   PT Time Calculation (min) 43 min   Equipment Utilized During Treatment Gait belt   Activity Tolerance Patient tolerated treatment well   Behavior During Therapy [facility] for tasks assessed/performed      Past Medical History:  Diagnosis Date  . Anemia 07/29/2013  . Asthma    childhood asthma  . Benign prostatic hypertrophy    several prostate biopsies neg for cancer.   . Bilateral hip pain   . Cancer ([place])   . CHF (congestive heart failure) ([place])   . Complication of anesthesia    MORPHINE CAUSES HALLUCINATIONS  . CVA (cerebral vascular accident) ([place]) 06/2016  . Depression   . FH: colonic polyps 2010  . Gout   . History of kidney stones   . Hypertension   . Kidney cysts   . Morbid obesity ([place])   . Numbness    feet / legs  . PAF (paroxysmal atrial fibrillation) with RVR 07/29/2013  . Sleep apnea    USES C PAP    Past Surgical History:  Procedure Laterality Date  . ARTHROSCOPY KNEE W/ DRILLING    . BREATH TEK H PYLORI N/A 12/23/2012   Procedure: BREATH TEK H PYLORI;  Surgeon: Gayland Curry, MD;  Location: [facility];  Service: General;  Laterality: N/A;  . CHOLECYSTECTOMY  1989  . COLONOSCOPY N/A 07/31/2013   Procedure: COLONOSCOPY;  Surgeon: Gatha Mayer, MD;  Location: [facility];  Service: Endoscopy;  Laterality: N/A;  . ESOPHAGOGASTRODUODENOSCOPY N/A 07/22/2013   Procedure: ESOPHAGOGASTRODUODENOSCOPY (EGD);   Surgeon: Irene Shipper, MD;  Location: [facility] END[facility]COPY;  Service: Endoscopy;  Laterality: N/A;  . ESOPHAGOGASTRODUODENOSCOPY N/A 07/31/2013   Procedure: ESOPHAGOGASTRODUODENOSCOPY (EGD);  Surgeon: Gatha Mayer, MD;  Location: [facility] ENDOSCOPY;  Service: Endoscopy;  Laterality: N/A;  . INTRAOPERATIVE TRANSESOPHAGEAL ECHOCARDIOGRAM N/A 07/18/2013   Procedure: INTRAOPERATIVE TRANSESOPHAGEAL ECHOCARDIOGRAM;  Surgeon: Grace Isaac, MD;  Location: [facility];  Service: Open Heart Surgery;  Laterality: N/A;  . LAPAROSCOPIC GASTRIC BANDING WITH HIATAL HERNIA REPAIR N/A 04/08/2013   Procedure: LAPAROSCOPIC GASTRIC BANDING WITH possible  HIATAL HERNIA REPAIR;  Surgeon: Gayland Curry, MD;  Location[facility];  Service: General;  Laterality: N/A;  . LITHOTRIPSY    . renal calculi    . SUBXYPHOID PERICARDIAL WINDOW N/A 07/18/2013   Procedure: SUBXYPHOID PERICARDIAL WINDOW;  Surgeon: Grace Isaac, MD;  Location: [facility];  Service: Thoracic;  Laterality: N/A;  . TONSILLECTOMY    . total knee replacment     bilat  . trigger finger repair     also lue, cts surgically repaired  . WISDOM TOOTH EXTRACTION      There were no vitals filed for this visit.      Subjective Assessment - 08/25/16 1321    Subjective No new complaints. Reports vision is slowly getting better, and that his ability to swallow is much better. No falls or pain to report.  To clinic today with his personal rollator and also brought his quad cane as requested by PT.                            Patient is accompained by: Family member   Pertinent History asthma, basal cell carcinoma, obesity, gout, depression, HTN, OSA, peripheral neuropathy, CHF, PAF, PE, bil TKA   Limitations Walking   Patient Stated Goals improve walking, balance, wants to drive again and be independent   Currently in Pain? No/denies   Pain Score 0-No pain            [facility] Adult PT Treatment/Exercise - 08/25/16 1324      Transfers   Transfers Sit to Stand;Stand to Sit    Sit to Stand 5: Supervision;With upper extremity assist;From chair/3-in-1;From bed   Sit to Stand Details Verbal cues for sequencing;Verbal cues for precautions/safety;Verbal cues for safe use of DME/AE   Stand to Sit 5: Supervision;With upper extremity assist;To chair/3-in-1;To bed   Stand to Sit Details (indicate cue type and reason) Verbal cues for precautions/safety;Verbal cues for technique;Verbal cues for safe use of DME/AE     Ambulation/Gait   Ambulation/Gait Yes   Ambulation/Gait Assistance 4: Min guard;4: Min assist;3: Mod assist;5: Supervision  min>mod assist with fatigue with cane   Ambulation/Gait Assistance Details min guard progressing to supervision with rollator with pt's demo'ing safe use of brakes; min guard assist with cane until fatigued, then pt needed min>mod assist for balance. cues on upright posture and step length with gait.    Ambulation Distance (Feet) 100 Feet  x1, ;60, 40, 80 x2 reps each with [facility]   Assistive device Rollator;Small based quad cane            PT Education - 08/25/16 2041    Education provided Yes   Education Details Wallenberg Syndrome information; information on [employer] near their house for community fitness option   Person(s) Educated Patient;Spouse   Methods Explanation;Demonstration;Handout;Verbal cues   Comprehension Verbalized understanding;Returned demonstration          PT Short Term Goals - 08/08/16 1452      PT SHORT TERM GOAL #1   Title verbalize understanding of CVA risk factors/warning signs (08/10/16)   Status Achieved     PT SHORT TERM GOAL #2   Title improve timed up and go to < 30 sec for improved mobility (08/10/16)   Status Achieved     PT SHORT TERM GOAL #3   Title improve gait velocity to > 1.32 ft/sec for improved mobility and community access (08/10/16)   Status Achieved     PT SHORT TERM GOAL #4   Title amb > 300' on level indoor/paved outdoor surfaces with LRAD with supervision for improved  community access (08/10/16)   Status Achieved     PT SHORT TERM GOAL #5   Title perform BERG with appropriate LTG to be written (08/10/16)   Status Achieved           PT Long Term Goals - 08/14/16 1700      PT LONG TERM GOAL #1   Title independent with HEP (09/07/16)   Time 8   Period Weeks   Status On-going     PT LONG TERM GOAL #2   Title improve timed up and go to < 21 sec with LRAD for improved mobility (09/07/16)   Time 8   Period Weeks   Status On-going  PT LONG TERM GOAL #3   Title improve gait velocity to > 1.8 ft/sec for improved mobility and decreased fall risk (09/07/16)   Time 8   Period Weeks   Status On-going     PT LONG TERM GOAL #4   Title amb > 500' on various indoor/outdoor surfaces with LRAD modified independent for improved independence and mobility (09/07/16)   Time 8   Period Weeks   Status On-going     PT LONG TERM GOAL #5   Title Pt will decrease falls risk as indicated by an increase in BERG balance score to > or = 40/56 (goal revised on 08/14/2016)   Baseline 36/56; goal upgraded due to progress   Time 8   Period Weeks   Status Revised           Plan - 08/25/16 1323    Clinical Impression Statement Today's skilled session addressed gait with both the small based quad cane and pt's rollator. Pt is safe with cane for short household distances only with spouse near by for safety. Pt is also safe to use rollator in community to assist with increasing his activity level. Pt is making steady progress toward goals and should benefit from continued PT to progress toward unmet goals.                  Rehab Potential Good   Clinical Impairments Affecting Rehab Potential multiple comorbidities   PT Treatment/Interventions ADLs/Self Care Home Management;Cryotherapy;Electrical Stimulation;Moist Heat;Neuromuscular re-education;Balance training;Therapeutic exercise;Therapeutic activities;Functional mobility training;Gait training;Stair training;Patient/family  education;Orthotic Fit/Training;Manual techniques;Vestibular;Energy conservation   PT Next Visit Plan Continue to focus on weight shifting, midline orientation, gait with more upright gaze, turning, stairs with decreased UE support   Consulted and Agree with Plan of Care Patient;Family member/caregiver   Family Member Consulted wife      Patient will benefit from skilled therapeutic intervention in order to improve the following deficits and impairments:  Abnormal gait, Decreased balance, Decreased activity tolerance, Decreased strength, Postural dysfunction, Obesity, Decreased endurance, Decreased knowledge of use of DME, Decreased mobility  Visit Diagnosis: Other abnormalities of gait and mobility  Muscle weakness (generalized)  Unsteadiness on feet  Hemiplegia and hemiparesis following cerebral infarction affecting left non-dominant side [facility])     Problem List Patient Active Problem List   Diagnosis Date Noted  . Benign essential HTN   . 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 Embolic cerebral infarction ([place]) 06/21/2016  . Stroke syndrome ([place])   . OSA (obstructive sleep apnea)   . Dysphagia, post-stroke   . Ataxia, post-stroke   . Neurologic gait disorder   . Encounter for monitoring flecainide therapy 04/28/2016  . Pure hyperglyceridemia 04/26/2016  . Recurrent pulmonary emboli ([place]) 06/13/2015  . GERD (gastroesophageal reflux disease) 06/13/2015  . Asthma 06/13/2015  . Basal cell carcinoma of skin 06/28/2014  . 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 Sinus bradycardia 12/15/2013  . PAF (paroxysmal atrial fibrillation) ([place]) 07/29/2013  . Chronic anticoagulation 07/22/2013  . Gastric ulcer with hemorrhage 07/20/13 07/22/2013  . Diastolic congestive heart failure ([place]) 07/16/2013  . H/O laparoscopic adjustable gastric banding & hiatal hernia repair 04/08/13 04/23/2013  . Prediabetes 02/05/2013  . TMJ (temporomandibular joint syndrome) 09/11/2012  . Morbid  obesity ([place]) 08/09/2012  . Peripheral neuropathy ([place]) 11/02/2009  . Hyperlipidemia with target LDL less than 130 09/10/2008  . Depression 09/10/2008  . GOUT 06/28/2006  . BPH (benign prostatic hyperplasia) 06/28/2006    [place] Ora, PTA, [facility]  [address] [place], [place] [address] [phone] 08/25/16, 9:23 PM   Name: [name] MRN: [id number] Date of Birth: Feb 28, 1946

## 2016-08-25 NOTE — Patient Instructions (Signed)
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 The lateral medulla is a part of the brain stem. Oxygenated blood doesn't get to this part of the brain when the arteries that lead to it are blocked. A stroke can occur due to this blockage. This condition is also sometimes called lateral medullary infarction. The cause of the syndrome isn't always clear, however. Symptoms of Wallenberg syndrome The brain stem is in charge of delivering messages to the spinal cord for motor and sensory function. A stroke in this area causes problems with how the person's muscles function and sensations are perceived. The most common symptom people with Wallenberg syndrome have is dysphagia, or difficulty swallowing. This can become very serious if it affects how much nutrition you're getting. Other symptoms include: hoarseness  nausea  vomiting  hiccups  rapid eye movements, or nystagmus  a decrease in sweating  problems with body temperature sensation  dizziness  difficulty walking  difficulty maintaining balance Sometimes, people with Wallenberg syndrome experience paralysis or numbness on one side of the body. This can occur in the limbs, in the face, or even in a small area like the tongue. You can also experience a difference in how hot or cold something is on one side of the body. Some people will walk at a slant or report that everything around them seems tilted or off balance. The syndrome can also cause bradycardia, or a slow heart rate, and low or high blood pressure. Discuss any symptoms you have with your doctor. Every bit of information can help them make a diagnosis. Who is at risk for Wallenberg syndrome? Researchers have yet to figure out why this type of stroke occurs. However, some researchers have found a connection between those  who have artery disease, heart disease, blood clots, or minor neck trauma from rotational activities and Wallenberg syndrome. Minor neck trauma is a common cause among people younger than 36. You should tell your doctor if you have a history of any of these problems. How is Wallenberg syndrome diagnosed? A doctor will usually make a diagnosis after carefully reviewing a person's health history and listening to their description of the symptoms. You may need to undergo a CT scan or MRI if your doctor suspects that you have Wallenberg syndrome. They can order these imaging studies to confirm whether or not there is a block in the artery near the lateral medulla.  How is Wallenberg syndrome treated? No cure is available for this condition, but your doctor will probably focus treatment on relieving or eliminating your symptoms. They may prescribe speech and swallowing therapy to help you learn to swallow again. They may also recommend a feeding tube if your condition is severe. This can help provide you with the nutrients you need.  Your doctor may prescribe medication. Pain medication can help treat chronic or long-lasting pain. Alternatively, they may prescribe a blood thinner, such as heparin or warfarin, to help reduce or dissolve the blockage in the artery. This can also help to prevent future blood clots from forming. Sometimes an anti-epileptic or antiseizure drug called gabapentin can help with your symptoms. Surgery may be an option to remove the clot in extreme cases. This is not as common of a treatment due to the difficulty of getting to that area of the brain. Make sure to discuss your treatment options  with your doctor and follow the plan carefully. What is the long-term outlook for people with Wallenberg syndrome? The long-term outlook for people with Wallenberg syndrome is fairly positive. A successful recovery depends on where the stroke happened in the brainstem. It also depends on how much  damage occurred. Some people can recover between a few weeks to six months after treatment. Others with more significant damage may have trouble or more permanent disabilities. You should discuss your long-term outlook with your doctor if you have any questions. Be sure to follow your treatment plan carefully to ensure your best chances for a full recovery.

## 2016-08-29 ENCOUNTER — Ambulatory Visit (INDEPENDENT_AMBULATORY_CARE_PROVIDER_SITE_OTHER): Payer: Medicare Other | Admitting: Psychiatry

## 2016-08-29 ENCOUNTER — Encounter (HOSPITAL_COMMUNITY): Payer: Self-pay | Admitting: Psychiatry

## 2016-08-29 VITALS — BP 126/60 | HR 78 | Ht 72.0 in | Wt 359.0 lb

## 2016-08-29 DIAGNOSIS — Z818 Family history of other mental and behavioral disorders: Secondary | ICD-10-CM

## 2016-08-29 DIAGNOSIS — Z87891 Personal history of nicotine dependence: Secondary | ICD-10-CM | POA: Diagnosis not present

## 2016-08-29 DIAGNOSIS — G463 Brain stem stroke syndrome: Secondary | ICD-10-CM | POA: Diagnosis not present

## 2016-08-29 DIAGNOSIS — F330 Major depressive disorder, recurrent, mild: Secondary | ICD-10-CM

## 2016-08-29 MED ORDER — LORAZEPAM 0.5 MG PO TABS: 0.5000 mg | ORAL_TABLET | Freq: Every day | ORAL | 2 refills | Status: DC

## 2016-08-29 MED ORDER — SERTRALINE HCL 100 MG PO TABS: 150.0000 mg | ORAL_TABLET | Freq: Every day | ORAL | 2 refills | Status: DC

## 2016-08-29 NOTE — Progress Notes (Signed)
BH MD/PA/NP OP Progress Note  08/29/2016 1:26 PM [name]  MRN:  [id number]  Chief Complaint:  Chief Complaint    Follow-up     Subjective:  I have been very anxious and nervous.  Some nights I don't sleep.  I had a stroke in March.  HPI: Patient came for his follow-up appointment.  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  His been going to physical therapy and he is feeling better.  In the beginning he has difficulty walking and unable to talk.  He has hoarseness because he had weakness and vocal cords.  He endorse he had a strong willpower and he is trying to get better.  However due to sedentary lifestyle, he had gained weight.  He has limited ADLs.  He cannot drive and he need a lot of help from his wife.  He has numbness, tingling, weakness on his left side.  He is no longer taking Ativan which was discontinued when he was in the hospital.  It is unclear why Ativan was discontinued.  Patient is taking Risperdal 0.5 mg.  He has no paranoia, hallucination, negative thoughts and he is very hopeful to get better.  He is taking Zoloft 150 mg daily.  He admitted feeling tired and exhausted easily but he has not given up.  He enjoys seeing his granddaughter who is now 42 months old.  Patient denies any suicidal thoughts or homicidal thought.  His energy level is low.  He has gained weight but is trying to be more active.  Patient denies drinking alcohol or using any illegal substances.  Visit Diagnosis:    ICD-9-CM ICD-10-CM   1. 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 Patient has one psychiatric admission in 2006 due to increased depression and having suicidal thoughts. Since then patient has been seen in this office and stable on his psychiatric medication.  Past Medical History:  Past Medical History:  Diagnosis Date   . Anemia 07/29/2013  . Asthma    childhood asthma  . Benign prostatic hypertrophy    several prostate biopsies neg for cancer.   . Bilateral hip pain   . Cancer ([place])   . CHF (congestive heart failure) ([place])   . Complication of anesthesia    MORPHINE CAUSES HALLUCINATIONS  . CVA (cerebral vascular accident) ([place]) 06/2016  . Depression   . FH: colonic polyps 2010  . Gout   . History of kidney stones   . Hypertension   . Kidney cysts   . Morbid obesity ([place])   . Numbness    feet / legs  . PAF (paroxysmal atrial fibrillation) with RVR 07/29/2013  . Sleep apnea    USES C PAP    Past Surgical History:  Procedure Laterality Date  . ARTHROSCOPY KNEE W/ DRILLING    . BREATH TEK H PYLORI N/A 12/23/2012   Procedure: BREATH TEK H PYLORI;  Surgeon: Gayland Curry, MD;  Location: [facility];  Service: General;  Laterality: N/A;  . CHOLECYSTECTOMY  1989  . COLONOSCOPY N/A 07/31/2013   Procedure: COLONOSCOPY;  Surgeon: Gatha Mayer, MD;  Location: [facility];  Service: Endoscopy;  Laterality: N/A;  . ESOPHAGOGASTRODUODENOSCOPY N/A 07/22/2013   Procedure: ESOPHAGOGASTRODUODENOSCOPY (EGD);  Surgeon: Irene Shipper, MD;  Location: [facility] END[facility]COPY;  Service: Endoscopy;  Laterality: N/A;  .  ESOPHAGOGASTRODUODENOSCOPY N/A 07/31/2013   Procedure: ESOPHAGOGASTRODUODENOSCOPY (EGD);  Surgeon: Gatha Mayer, MD;  Location[facility];  Service: Endoscopy;  Laterality: N/A;  . INTRAOPERATIVE TRANSESOPHAGEAL ECHOCARDIOGRAM N/A 07/18/2013   Procedure: INTRAOPERATIVE TRANSESOPHAGEAL ECHOCARDIOGRAM;  Surgeon: Grace Isaac, MD;  Location: [facility];  Service: Open Heart Surgery;  Laterality: N/A;  . LAPAROSCOPIC GASTRIC BANDING WITH HIATAL HERNIA REPAIR N/A 04/08/2013   Procedure: LAPAROSCOPIC GASTRIC BANDING WITH possible  HIATAL HERNIA REPAIR;  Surgeon: Gayland Curry, MD;  Location: [facility];  Service: General;  Laterality: N/A;  . LITHOTRIPSY    . renal calculi    . SUBXYPHOID PERICARDIAL WINDOW N/A  07/18/2013   Procedure: SUBXYPHOID PERICARDIAL WINDOW;  Surgeon: Grace Isaac, MD;  Location: [facility];  Service: Thoracic;  Laterality: N/A;  . TONSILLECTOMY    . total knee replacment     bilat  . trigger finger repair     also lue, cts surgically repaired  . WISDOM TOOTH EXTRACTION      Family Psychiatric History: Reviewed.  Family History:  Family History  Problem Relation Age of Onset  . Depression Mother   . Hypertension Mother   . Heart disease Father        MI  . Depression Brother   . Stroke Paternal Aunt        CVA  . Diabetes Paternal Uncle   . Heart disease Paternal Uncle        MI  . Heart disease Paternal Grandmother        MI  . Diabetes Cousin        PATERNAL    Social History:  Social History   Social History  . Marital status: Married    Spouse name: N/A  . Number of children: 1  . Years of education: N/A   Occupational History  . retired-Manager [employer]    Social History Main Topics  . Smoking status: Former Smoker    Packs/day: 0.20    Years: 13.00    Types: Cigarettes    Start date: 04/03/1961    Quit date: 08/30/1974  . Smokeless tobacco: Never Used  . Alcohol use No     Comment: Occasionally; 1 small drink a week  . Drug use: No  . Sexual activity: Not Currently   Other Topics Concern  . None   Social History Narrative  . None    Allergies:  Allergies  Allergen Reactions  . Morphine Other (See Comments)    Nausea and Severe hallucinations  . Codeine Nausea Only  . Penicillins Other (See Comments)    LOC with shot  . Propoxyphene N-Acetaminophen Nausea Only    Metabolic Disorder Labs: Recent Results (from the past 2160 hour(s))  Protime-INR     Status: None   Collection Time: 06/19/16  2:23 AM  Result Value Ref Range   Prothrombin Time 14.2 11.4 - 15.2 seconds   INR 1.09   APTT     Status: None   Collection Time: 06/19/16  2:23 AM  Result Value Ref Range   aPTT 25 24 - 36 seconds  CBC     Status: None    Collection Time: 06/19/16  2:23 AM  Result Value Ref Range   WBC 10.0 4.0 - 10.5 K/uL   RBC 5.06 4.22 - 5.81 MIL/uL   Hemoglobin 14.6 13.0 - 17.0 g/dL   HCT 44.4 39.0 - 52.0 %   MCV 87.7 78.0 - 100.0 fL   MCH 28.9 26.0 - 34.0  pg   MCHC 32.9 30.0 - 36.0 g/dL   RDW 14.1 11.5 - 15.5 %   Platelets 183 150 - 400 K/uL  Differential     Status: Abnormal   Collection Time: 06/19/16  2:23 AM  Result Value Ref Range   Neutrophils Relative % 60 %   Neutro Abs 6.1 1.7 - 7.7 K/uL   Lymphocytes Relative 26 %   Lymphs Abs 2.6 0.7 - 4.0 K/uL   Monocytes Relative 11 %   Monocytes Absolute 1.1 (H) 0.1 - 1.0 K/uL   Eosinophils Relative 2 %   Eosinophils Absolute 0.2 0.0 - 0.7 K/uL   Basophils Relative 1 %   Basophils Absolute 0.1 0.0 - 0.1 K/uL  Comprehensive metabolic panel     Status: Abnormal   Collection Time: 06/19/16  2:23 AM  Result Value Ref Range   Sodium 139 135 - 145 mmol/L   Potassium 3.8 3.5 - 5.1 mmol/L    Comment: SPECIMEN HEMOLYZED. HEMOLYSIS MAY AFFECT INTEGRITY OF RESULTS.   Chloride 104 101 - 111 mmol/L   CO2 24 22 - 32 mmol/L   Glucose, Bld 113 (H) 65 - 99 mg/dL   BUN 14 6 - 20 mg/dL   Creatinine, Ser 1.07 0.61 - 1.24 mg/dL   Calcium 8.2 (L) 8.9 - 10.3 mg/dL   Total Protein 5.9 (L) 6.5 - 8.1 g/dL   Albumin 3.3 (L) 3.5 - 5.0 g/dL   AST 33 15 - 41 U/L   ALT 21 17 - 63 U/L   Alkaline Phosphatase 86 38 - 126 U/L   Total Bilirubin 0.9 0.3 - 1.2 mg/dL   GFR calc non Af Amer >60 >60 mL/min   GFR calc Af Amer >60 >60 mL/min    Comment: (NOTE) The eGFR has been calculated using the CKD EPI equation. This calculation has not been validated in all clinical situations. eGFR's persistently <60 mL/min signify possible Chronic Kidney Disease.    Anion gap 11 5 - 15  CBG monitoring, ED     Status: Abnormal   Collection Time: 06/19/16  2:25 AM  Result Value Ref Range   Glucose-Capillary 105 (H) 65 - 99 mg/dL  I-stat troponin, ED     Status: None   Collection Time: 06/19/16   2:32 AM  Result Value Ref Range   Troponin i, poc 0.00 0.00 - 0.08 ng/mL   Comment 3            Comment: Due to the release kinetics of cTnI, a negative result within the first hours of the onset of symptoms does not rule out myocardial infarction with certainty. If myocardial infarction is still suspected, repeat the test at appropriate intervals.   I-Stat Chem 8, ED     Status: Abnormal   Collection Time: 06/19/16  2:33 AM  Result Value Ref Range   Sodium 141 135 - 145 mmol/L   Potassium 3.2 (L) 3.5 - 5.1 mmol/L   Chloride 104 101 - 111 mmol/L   BUN 15 6 - 20 mg/dL   Creatinine, Ser 1.00 0.61 - 1.24 mg/dL   Glucose, Bld 115 (H) 65 - 99 mg/dL   Calcium, Ion 0.93 (L) 1.15 - 1.40 mmol/L   TCO2 25 0 - 100 mmol/L   Hemoglobin 14.6 13.0 - 17.0 g/dL   HCT 43.0 39.0 - 52.0 %  Sedimentation rate     Status: None   Collection Time: 06/19/16  4:01 AM  Result Value Ref Range   Sed Rate 13  0 - 16 mm/hr  Lipid panel     Status: Abnormal   Collection Time: 06/19/16  7:30 AM  Result Value Ref Range   Cholesterol 124 0 - 200 mg/dL   Triglycerides 74 <150 mg/dL   HDL 32 (L) >40 mg/dL   Total CHOL/HDL Ratio 3.9 RATIO   VLDL 15 0 - 40 mg/dL   LDL Cholesterol 77 0 - 99 mg/dL    Comment:        Total Cholesterol/HDL:CHD Risk Coronary Heart Disease Risk Table                     Men   Women  1/2 Average Risk   3.4   3.3  Average Risk       5.0   4.4  2 X Average Risk   9.6   7.1  3 X Average Risk  23.4   11.0        Use the calculated Patient Ratio above and the CHD Risk Table to determine the patient's CHD Risk.        ATP III CLASSIFICATION (LDL):  <100     mg/dL   Optimal  100-129  mg/dL   Near or Above                    Optimal  130-159  mg/dL   Borderline  160-189  mg/dL   High  >190     mg/dL   Very High   Hemoglobin A1c     Status: Abnormal   Collection Time: 06/19/16  7:32 AM  Result Value Ref Range   Hgb A1c MFr Bld 5.8 (H) 4.8 - 5.6 %    Comment: (NOTE)          Pre-diabetes: 5.7 - 6.4         Diabetes: >6.4         Glycemic control for adults with diabetes: <7.0    Mean Plasma Glucose 120 mg/dL    Comment: (NOTE) Performed At: [facility] [place], [place] [id number] Lindon Romp MD UV:[id number]   Echocardiogram     Status: None   Collection Time: 06/19/16  4:43 PM  Result Value Ref Range   Weight 5,728.43 oz   BP 138/62 mmHg  Heparin level (unfractionated)     Status: Abnormal   Collection Time: 06/20/16  7:29 PM  Result Value Ref Range   Heparin Unfractionated 1.10 (H) 0.30 - 0.70 IU/mL    Comment: RESULTS CONFIRMED BY MANUAL DILUTION        IF HEPARIN RESULTS ARE BELOW EXPECTED VALUES, AND PATIENT DOSAGE HAS BEEN CONFIRMED, SUGGEST FOLLOW UP TESTING OF ANTITHROMBIN III LEVELS.   APTT     Status: Abnormal   Collection Time: 06/20/16  7:29 PM  Result Value Ref Range   aPTT 61 (H) 24 - 36 seconds    Comment:        IF BASELINE aPTT IS ELEVATED, SUGGEST PATIENT RISK ASSESSMENT BE USED TO DETERMINE APPROPRIATE ANTICOAGULANT THERAPY.   Basic metabolic panel     Status: Abnormal   Collection Time: 06/21/16  6:14 AM  Result Value Ref Range   Sodium 138 135 - 145 mmol/L   Potassium 3.7 3.5 - 5.1 mmol/L   Chloride 101 101 - 111 mmol/L   CO2 27 22 - 32 mmol/L   Glucose, Bld 92 65 - 99 mg/dL   BUN 16 6 - 20 mg/dL   Creatinine,  Ser 1.05 0.61 - 1.24 mg/dL   Calcium 8.4 (L) 8.9 - 10.3 mg/dL   GFR calc non Af Amer >60 >60 mL/min   GFR calc Af Amer >60 >60 mL/min    Comment: (NOTE) The eGFR has been calculated using the CKD EPI equation. This calculation has not been validated in all clinical situations. eGFR's persistently <60 mL/min signify possible Chronic Kidney Disease.    Anion gap 10 5 - 15  Magnesium     Status: None   Collection Time: 06/21/16  6:14 AM  Result Value Ref Range   Magnesium 2.2 1.7 - 2.4 mg/dL  Heparin level (unfractionated)     Status: Abnormal   Collection Time: 06/21/16   6:14 AM  Result Value Ref Range   Heparin Unfractionated 0.96 (H) 0.30 - 0.70 IU/mL    Comment: RESULTS CONFIRMED BY MANUAL DILUTION REPEATED TO VERIFY        IF HEPARIN RESULTS ARE BELOW EXPECTED VALUES, AND PATIENT DOSAGE HAS BEEN CONFIRMED, SUGGEST FOLLOW UP TESTING OF ANTITHROMBIN III LEVELS.   APTT     Status: Abnormal   Collection Time: 06/21/16  6:14 AM  Result Value Ref Range   aPTT 64 (H) 24 - 36 seconds    Comment:        IF BASELINE aPTT IS ELEVATED, SUGGEST PATIENT RISK ASSESSMENT BE USED TO DETERMINE APPROPRIATE ANTICOAGULANT THERAPY.   CBC     Status: None   Collection Time: 06/21/16  6:14 AM  Result Value Ref Range   WBC 9.3 4.0 - 10.5 K/uL   RBC 5.08 4.22 - 5.81 MIL/uL   Hemoglobin 14.4 13.0 - 17.0 g/dL   HCT 45.1 39.0 - 52.0 %   MCV 88.8 78.0 - 100.0 fL   MCH 28.3 26.0 - 34.0 pg   MCHC 31.9 30.0 - 36.0 g/dL   RDW 14.0 11.5 - 15.5 %   Platelets 160 150 - 400 K/uL  CBC WITH DIFFERENTIAL     Status: Abnormal   Collection Time: 06/22/16  5:50 AM  Result Value Ref Range   WBC 12.7 (H) 4.0 - 10.5 K/uL   RBC 5.24 4.22 - 5.81 MIL/uL   Hemoglobin 15.2 13.0 - 17.0 g/dL   HCT 46.7 39.0 - 52.0 %   MCV 89.1 78.0 - 100.0 fL   MCH 29.0 26.0 - 34.0 pg   MCHC 32.5 30.0 - 36.0 g/dL   RDW 14.2 11.5 - 15.5 %   Platelets 183 150 - 400 K/uL   Neutrophils Relative % 76 %   Neutro Abs 9.7 (H) 1.7 - 7.7 K/uL   Lymphocytes Relative 12 %   Lymphs Abs 1.5 0.7 - 4.0 K/uL   Monocytes Relative 11 %   Monocytes Absolute 1.3 (H) 0.1 - 1.0 K/uL   Eosinophils Relative 1 %   Eosinophils Absolute 0.1 0.0 - 0.7 K/uL   Basophils Relative 0 %   Basophils Absolute 0.0 0.0 - 0.1 K/uL  Comprehensive metabolic panel     Status: Abnormal   Collection Time: 06/22/16  5:50 AM  Result Value Ref Range   Sodium 141 135 - 145 mmol/L   Potassium 3.4 (L) 3.5 - 5.1 mmol/L   Chloride 100 (L) 101 - 111 mmol/L   CO2 25 22 - 32 mmol/L   Glucose, Bld 102 (H) 65 - 99 mg/dL   BUN 21 (H) 6 - 20  mg/dL   Creatinine, Ser 1.10 0.61 - 1.24 mg/dL   Calcium 9.0 8.9 - 10.3 mg/dL  Total Protein 6.2 (L) 6.5 - 8.1 g/dL   Albumin 3.4 (L) 3.5 - 5.0 g/dL   AST 26 15 - 41 U/L   ALT 19 17 - 63 U/L   Alkaline Phosphatase 85 38 - 126 U/L   Total Bilirubin 1.2 0.3 - 1.2 mg/dL   GFR calc non Af Amer >60 >60 mL/min   GFR calc Af Amer >60 >60 mL/min    Comment: (NOTE) The eGFR has been calculated using the CKD EPI equation. This calculation has not been validated in all clinical situations. eGFR's persistently <60 mL/min signify possible Chronic Kidney Disease.    Anion gap 16 (H) 5 - 15  Basic metabolic panel     Status: Abnormal   Collection Time: 07/10/16  6:21 AM  Result Value Ref Range   Sodium 141 135 - 145 mmol/L   Potassium 3.1 (L) 3.5 - 5.1 mmol/L   Chloride 105 101 - 111 mmol/L   CO2 26 22 - 32 mmol/L   Glucose, Bld 118 (H) 65 - 99 mg/dL   BUN 22 (H) 6 - 20 mg/dL   Creatinine, Ser 1.06 0.61 - 1.24 mg/dL   Calcium 8.3 (L) 8.9 - 10.3 mg/dL   GFR calc non Af Amer >60 >60 mL/min   GFR calc Af Amer >60 >60 mL/min    Comment: (NOTE) The eGFR has been calculated using the CKD EPI equation. This calculation has not been validated in all clinical situations. eGFR's persistently <60 mL/min signify possible Chronic Kidney Disease.    Anion gap 10 5 - 15  CBC with Differential/Platelet     Status: None   Collection Time: 07/10/16  6:21 AM  Result Value Ref Range   WBC 8.7 4.0 - 10.5 K/uL   RBC 4.57 4.22 - 5.81 MIL/uL   Hemoglobin 13.2 13.0 - 17.0 g/dL   HCT 41.0 39.0 - 52.0 %   MCV 89.7 78.0 - 100.0 fL   MCH 28.9 26.0 - 34.0 pg   MCHC 32.2 30.0 - 36.0 g/dL   RDW 14.4 11.5 - 15.5 %   Platelets 196 150 - 400 K/uL   Neutrophils Relative % 67 %   Neutro Abs 5.8 1.7 - 7.7 K/uL   Lymphocytes Relative 22 %   Lymphs Abs 1.9 0.7 - 4.0 K/uL   Monocytes Relative 8 %   Monocytes Absolute 0.7 0.1 - 1.0 K/uL   Eosinophils Relative 3 %   Eosinophils Absolute 0.2 0.0 - 0.7 K/uL    Basophils Relative 0 %   Basophils Absolute 0.0 0.0 - 0.1 K/uL  Basic metabolic panel     Status: Abnormal   Collection Time: 07/12/16  5:57 AM  Result Value Ref Range   Sodium 143 135 - 145 mmol/L   Potassium 3.6 3.5 - 5.1 mmol/L   Chloride 105 101 - 111 mmol/L   CO2 27 22 - 32 mmol/L   Glucose, Bld 101 (H) 65 - 99 mg/dL   BUN 19 6 - 20 mg/dL   Creatinine, Ser 1.05 0.61 - 1.24 mg/dL   Calcium 8.5 (L) 8.9 - 10.3 mg/dL   GFR calc non Af Amer >60 >60 mL/min   GFR calc Af Amer >60 >60 mL/min    Comment: (NOTE) The eGFR has been calculated using the CKD EPI equation. This calculation has not been validated in all clinical situations. eGFR's persistently <60 mL/min signify possible Chronic Kidney Disease.    Anion gap 11 5 - 15   Lab Results  Component  Value Date   HGBA1C 5.8 (H) 06/19/2016   MPG 120 06/19/2016   MPG 128 (H) 12/05/2012   No results found for: PROLACTIN Lab Results  Component Value Date   CHOL 124 06/19/2016   TRIG 74 06/19/2016   HDL 32 (L) 06/19/2016   CHOLHDL 3.9 06/19/2016   VLDL 15 06/19/2016   LDLCALC 77 06/19/2016   LDLCALC 81 04/26/2016     Current Medications: Current Outpatient Prescriptions  Medication Sig Dispense Refill  . apixaban (ELIQUIS) 5 MG TABS tablet Take 1 tablet (5 mg total) by mouth 2 (two) times daily. 180 tablet 3  . aspirin 81 MG tablet Take 81 mg by mouth 2 (two) times daily.     [name] atorvastatin (LIPITOR) 80 MG tablet Take 1 tablet (80 mg total) by mouth daily. 90 tablet 1  . CALCIUM PO Take 500 mg by mouth 3 (three) times daily.     . Cyanocobalamin (VITAMIN B-12) 1000 MCG SUBL Place 1,000 mcg under the tongue every evening.     [name] Sodium (DSS) 100 MG CAPS Take 100 mg by mouth every other day.    . finasteride (PROSCAR) 5 MG tablet Take 1 tablet (5 mg total) by mouth daily. 30 tablet 1  . flecainide (TAMBOCOR) 50 MG tablet Take 1 tablet (50 mg total) by mouth every 12 (twelve) hours. 180 tablet 2  . furosemide (LASIX)  40 MG tablet Take 1 tablet (40 mg total) by mouth daily. 30 tablet 1  . KLOR-CON M20 20 MEQ tablet TAKE 2 TABLETS EVERY DAY 30 tablet 1  . loratadine (CLARITIN) 10 MG tablet Take 10 mg by mouth daily.    . montelukast (SINGULAIR) 10 MG tablet TAKE 1 TABLET BY MOUTH ONCE A DAY AT BEDTIME 30 tablet 1  . Multiple Vitamins-Iron (MULTIVITAMINS WITH IRON) TABS tablet Take 1 tablet by mouth daily.    . pantoprazole (PROTONIX) 40 MG tablet Take 1 tablet (40 mg total) by mouth daily. 30 tablet 1  . risperiDONE (RISPERDAL) 0.5 MG tablet Take 1 tablet (0.5 mg total) by mouth at bedtime. 30 tablet 2  . rOPINIRole (REQUIP) 0.5 MG tablet Take 1 tablet (0.5 mg total) by mouth at bedtime. 30 tablet 1  . sertraline (ZOLOFT) 50 MG tablet Take 3 tablets (150 mg total) by mouth daily. 30 tablet 1  . budesonide-formoterol (SYMBICORT) 160-4.5 MCG/ACT inhaler Inhale 2 puffs into the lungs 2 (two) times daily as needed (shortness of breath).    . fluticasone furoate-vilanterol (BREO ELLIPTA) 200-25 MCG/INH AEPB Inhale 1 puff into the lungs daily. (Patient not taking: Reported on 08/29/2016) 1 each 0  . mometasone-formoterol (DULERA) 200-5 MCG/ACT AERO Inhale 2 puffs into the lungs 2 (two) times daily. (Patient not taking: Reported on 08/29/2016) 1 Inhaler 1  . traMADol (ULTRAM) 50 MG tablet Take 1 tablet (50 mg total) by mouth every 8 (eight) hours as needed for severe pain. (Patient not taking: Reported on 08/29/2016) 20 tablet 0   No current facility-administered medications for this visit.     Neurologic: Headache: No Seizure: No Paresthesias: Yes  Musculoskeletal: Strength & Muscle Tone: decreased Gait & Station: unsteady, Using walker for his balance Patient leans: Left  Psychiatric Specialty Exam: Review of Systems  Constitutional: Positive for malaise/fatigue. Negative for weight loss.  HENT:       Hoarseness  Respiratory: Positive for shortness of breath.   Musculoskeletal: Positive for joint pain.   Skin: Negative for itching and rash.  Neurological: Positive for tingling,  sensory change, focal weakness and weakness.  Psychiatric/Behavioral: The patient is nervous/anxious.     Blood pressure 126/60, pulse 78, height 6' (1.829 m), weight (!) 359 lb (162.8 kg), SpO2 94 %.Body mass index is 48.69 kg/m.  General Appearance: Casual  Eye Contact:  Fair  Speech:  Slow  Volume:  Decreased  Mood:  Anxious and Dysphoric  Affect:  Congruent  Thought Process:  Goal Directed  Orientation:  Full (Time, Place, and Person)  Thought Content: Rumination   Suicidal Thoughts:  No  Homicidal Thoughts:  No  Memory:  Immediate;   Good Recent;   Fair Remote;   Fair  Judgement:  Fair  Insight:  Good  Psychomotor Activity:  Decreased  Concentration:  Concentration: Fair and Attention Span: Fair  Recall:  [employer] of Knowledge: Good  Language: Good  Akathisia:  No  Handed:  Right  AIMS (if indicated):  0  Assets:  Communication Skills Desire for Improvement Housing Resilience Social Support  ADL's:  Impaired  Cognition: WNL  Sleep:  fair   Assessment: Major depressive disorder, recurrent.  Plan: I reviewed records from other providers including blood results and discharge summary from the hospital.  Despite weakness on his left side due to stroke, patient is very hopeful.  I recommended to discontinue Risperdal due to increased risk of stroke and restart lorazepam 0.5 mg at bedtime.  However I discussed that if he started to have negative thinking and paranoia that he will call us immediately.  Encourage to continue physical therapy.  Discussed weight gain and recommended to watch his calorie intake.  Continue Zoloft 150 mg daily.  Discuss safety plan that anytime having active suicidal thoughts or homicidal thoughts and he need to call 911 or the local emergency room.  Follow-up in 3 months.  ARFEEN,SYED T., MD 08/29/2016, 1:26 PM

## 2016-08-30 ENCOUNTER — Encounter: Payer: Self-pay | Admitting: Physical Therapy

## 2016-08-30 ENCOUNTER — Ambulatory Visit: Payer: Medicare Other | Admitting: Physical Therapy

## 2016-08-30 DIAGNOSIS — R2681 Unsteadiness on feet: Secondary | ICD-10-CM

## 2016-08-30 DIAGNOSIS — R2689 Other abnormalities of gait and mobility: Secondary | ICD-10-CM

## 2016-08-30 DIAGNOSIS — M6281 Muscle weakness (generalized): Secondary | ICD-10-CM

## 2016-08-30 DIAGNOSIS — I69354 Hemiplegia and hemiparesis following cerebral infarction affecting left non-dominant side: Secondary | ICD-10-CM

## 2016-08-31 NOTE — Therapy (Signed)
[facility] [address] [place], [place], [address] Phone: [phone]   Fax:  [phone]  Physical Therapy Treatment  Patient Details  Name: [name] MRN: [id number] Date of Birth: 1945/11/12 Referring Provider: Dr. Alysia Penna  Encounter Date: 08/30/2016      PT End of Session - 08/30/16 1412    Visit Number 14   Number of Visits 16   Date for PT Re-Evaluation 09/07/16   Authorization Type Medicare-G Codes and Progress Notes   PT Start Time 3500   PT Stop Time 1445   PT Time Calculation (min) 40 min   Equipment Utilized During Treatment Gait belt   Activity Tolerance Patient tolerated treatment well   Behavior During Therapy [facility] for tasks assessed/performed      Past Medical History:  Diagnosis Date  . Anemia 07/29/2013  . Asthma    childhood asthma  . Benign prostatic hypertrophy    several prostate biopsies neg for cancer.   . Bilateral hip pain   . Cancer ([place])   . CHF (congestive heart failure) ([place])   . Complication of anesthesia    MORPHINE CAUSES HALLUCINATIONS  . CVA (cerebral vascular accident) ([place]) 06/2016  . Depression   . FH: colonic polyps 2010  . Gout   . History of kidney stones   . Hypertension   . Kidney cysts   . Morbid obesity ([place])   . Numbness    feet / legs  . PAF (paroxysmal atrial fibrillation) with RVR 07/29/2013  . Sleep apnea    USES C PAP    Past Surgical History:  Procedure Laterality Date  . ARTHROSCOPY KNEE W/ DRILLING    . BREATH TEK H PYLORI N/A 12/23/2012   Procedure: BREATH TEK H PYLORI;  Surgeon: Gayland Curry, MD;  Location: [facility];  Service: General;  Laterality: N/A;  . CHOLECYSTECTOMY  1989  . COLONOSCOPY N/A 07/31/2013   Procedure: COLONOSCOPY;  Surgeon: Gatha Mayer, MD;  Location: [place];  Service: Endoscopy;  Laterality: N/A;  . ESOPHAGOGASTRODUODENOSCOPY N/A 07/22/2013   Procedure: ESOPHAGOGASTRODUODENOSCOPY (EGD);   Surgeon: Irene Shipper, MD;  Location: [facility] ENDOSCOPY;  Service: Endoscopy;  Laterality: N/A;  . ESOPHAGOGASTRODUODENOSCOPY N/A 07/31/2013   Procedure: ESOPHAGOGASTRODUODENOSCOPY (EGD);  Surgeon: Gatha Mayer, MD;  Location: [facility] ENDOSCOPY;  Service: Endoscopy;  Laterality: N/A;  . INTRAOPERATIVE TRANSESOPHAGEAL ECHOCARDIOGRAM N/A 07/18/2013   Procedure: INTRAOPERATIVE TRANSESOPHAGEAL ECHOCARDIOGRAM;  Surgeon: Grace Isaac, MD;  Location: [facility];  Service: Open Heart Surgery;  Laterality: N/A;  . LAPAROSCOPIC GASTRIC BANDING WITH HIATAL HERNIA REPAIR N/A 04/08/2013   Procedure: LAPAROSCOPIC GASTRIC BANDING WITH possible  HIATAL HERNIA REPAIR;  Surgeon: Gayland Curry, MD;  [facility]: [facility];  Service: General;  Laterality: N/A;  . LITHOTRIPSY    . renal calculi    . SUBXYPHOID PERICARDIAL WINDOW N/A 07/18/2013   Procedure: SUBXYPHOID PERICARDIAL WINDOW;  Surgeon: Grace Isaac, MD;  Location: [facility];  Service: Thoracic;  Laterality: N/A;  . TONSILLECTOMY    . total knee replacment     bilat  . trigger finger repair     also lue, cts surgically repaired  . WISDOM TOOTH EXTRACTION      There were no vitals filed for this visit.      Subjective Assessment - 08/30/16 1411    Subjective No new compaints. No falls or pain to report. Using quad cane on arrival to clinic today. No significant changes in vision. continues to  slowly improve.    Patient is accompained by: Family member   Pertinent History asthma, basal cell carcinoma, obesity, gout, depression, HTN, OSA, peripheral neuropathy, CHF, PAF, PE, bil TKA   Limitations Walking   Patient Stated Goals improve walking, balance, wants to drive again and be independent   Currently in Pain? No/denies   Pain Score 0-No pain            [facility] Adult PT Treatment/Exercise - 08/30/16 1413      Transfers   Transfers Sit to Stand;Stand to Sit   Sit to Stand 5: Supervision;With upper extremity assist;From chair/3-in-1;From bed   Stand to Sit  5: Supervision;With upper extremity assist;To chair/3-in-1;To bed     Ambulation/Gait   Ambulation/Gait Yes   Ambulation/Gait Assistance 4: Min guard   Ambulation/Gait Assistance Details cues on posture, step length and weight shifting.    Ambulation Distance (Feet) 100 Feet  x2   Assistive device Small based quad cane   Gait Pattern Step-to pattern;Step-through pattern;Decreased step length - right;Decreased step length - left;Decreased stance time - right;Decreased stride length;Decreased weight shift to right;Lateral trunk lean to left;Trunk flexed   Ambulation Surface Level;Indoor     High Level Balance   High Level Balance Activities Marching forwards;Marching backwards;Tandem walking;Side stepping  tandem fwd/bwd, toe walk fwd/bwd   High Level Balance Comments at counter top with single UE support for balance: x3 laps each/each way with min guard to min assist for balance             Balance Exercises - 08/30/16 1437      Balance Exercises: Standing   Standing Eyes Closed Head turns;Wide (BOA);Foam/compliant surface;Other reps (comment);Limitations   SLS with Vectors Solid surface;Upper extremity assist 1;Limitations     Balance Exercises: Standing   Standing Eyes Closed Limitations on airex with feet apart, single UE fingertip support on counter top: EC no head movements for 15 sec's x 3 reps, progressing to EC head movements left<>right and up<>down x 10 reps each. min to bouts of mod assist for balance.    SLS with Vectors Limitations 2 tall cones on floor with single UE support: fwd toe taps x 10 reps each, cross over toe taps x 10 reps each, fwd double toe taps x 10 reps each and, cross double toe taps x 10 reps each, min guard assist with cues on weight shifitng, stance position and posture with exercise.              PT Short Term Goals - 08/08/16 1452      PT SHORT TERM GOAL #1   Title verbalize understanding of CVA risk factors/warning signs (08/10/16)    Status Achieved     PT SHORT TERM GOAL #2   Title improve timed up and go to < 30 sec for improved mobility (08/10/16)   Status Achieved     PT SHORT TERM GOAL #3   Title improve gait velocity to > 1.32 ft/sec for improved mobility and community access (08/10/16)   Status Achieved     PT SHORT TERM GOAL #4   Title amb > 300' on level indoor/paved outdoor surfaces with LRAD with supervision for improved community access (08/10/16)   Status Achieved     PT SHORT TERM GOAL #5   Title perform BERG with appropriate LTG to be written (08/10/16)   Status Achieved           PT Long Term Goals - 08/14/16 1700  PT LONG TERM GOAL #1   Title independent with HEP (09/07/16)   Time 8   Period Weeks   Status On-going     PT LONG TERM GOAL #2   Title improve timed up and go to < 21 sec with LRAD for improved mobility (09/07/16)   Time 8   Period Weeks   Status On-going     PT LONG TERM GOAL #3   Title improve gait velocity to > 1.8 ft/sec for improved mobility and decreased fall risk (09/07/16)   Time 8   Period Weeks   Status On-going     PT LONG TERM GOAL #4   Title amb > 500' on various indoor/outdoor surfaces with LRAD modified independent for improved independence and mobility (09/07/16)   Time 8   Period Weeks   Status On-going     PT LONG TERM GOAL #5   Title Pt will decrease falls risk as indicated by an increase in BERG balance score to > or = 40/56 (goal revised on 08/14/2016)   Baseline 36/56; goal upgraded due to progress   Time 8   Period Weeks   Status Revised             Plan - 08/30/16 1413    Clinical Impression Statement today's skilled session continued to address gait with [facility] and balance. Pt is most challenged by single leg stance and compliant surfaces at this time. Pt is progressing toward goals and should benefit from continued PT to progress toward unmet goals.    Rehab Potential Good   Clinical Impairments Affecting Rehab Potential multiple  comorbidities   PT Treatment/Interventions ADLs/Self Care Home Management;Cryotherapy;Electrical Stimulation;Neuromuscular re-education;Balance training;Therapeutic exercise;Therapeutic activities;Functional mobility training;Gait training;Stair training;Patient/family education;Orthotic Fit/Training;Manual techniques;Vestibular;Energy conservation;Moist Heat   PT Next Visit Plan LTGs due by 09/07/16; gait with cane, including with head turns/movements, balance- emphasis on single leg stance and complaint surfaces;    Consulted and Agree with Plan of Care Patient;Family member/caregiver   Family Member Consulted wife      Patient will benefit from skilled therapeutic intervention in order to improve the following deficits and impairments:  Abnormal gait, Decreased balance, Decreased activity tolerance, Decreased strength, Postural dysfunction, Obesity, Decreased endurance, Decreased knowledge of use of DME, Decreased mobility  Visit Diagnosis: Other abnormalities of gait and mobility  Muscle weakness (generalized)  Unsteadiness on feet  Hemiplegia and hemiparesis following cerebral infarction affecting left non-dominant side [facility])     Problem List Patient Active Problem List   Diagnosis Date Noted  . Benign essential HTN   . 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 Embolic cerebral infarction ([place]) 06/21/2016  . Stroke syndrome ([place])   . OSA (obstructive sleep apnea)   . Dysphagia, post-stroke   . Ataxia, post-stroke   . Neurologic gait disorder   . Encounter for monitoring flecainide therapy 04/28/2016  . Pure hyperglyceridemia 04/26/2016  . Recurrent pulmonary emboli ([place]) 06/13/2015  . GERD (gastroesophageal reflux disease) 06/13/2015  . Asthma 06/13/2015  . Basal cell carcinoma of skin 06/28/2014  . 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 Sinus bradycardia 12/15/2013  . PAF (paroxysmal atrial fibrillation) ([place]) 07/29/2013  . Chronic anticoagulation 07/22/2013  .  Gastric ulcer with hemorrhage 07/20/13 07/22/2013  . Diastolic congestive heart failure ([place]) 07/16/2013  . H/O laparoscopic adjustable gastric banding & hiatal hernia repair 04/08/13 04/23/2013  . Prediabetes 02/05/2013  . TMJ (temporomandibular joint syndrome) 09/11/2012  . Morbid obesity ([place]) 08/09/2012  . Peripheral neuropathy ([place]) 11/02/2009  . Hyperlipidemia  with target LDL less than 130 09/10/2008  . Depression 09/10/2008  . GOUT 06/28/2006  . BPH (benign prostatic hyperplasia) 06/28/2006    Willow Ora, PTA, [facility] Outpatient Neuro [facility] [address] [place], [place] [address] [phone] 08/31/16, 10:51 AM   Name: RAAHIL ONG MRN: [id number] Date of Birth: 1946/03/18

## 2016-09-01 ENCOUNTER — Ambulatory Visit: Payer: Medicare Other | Attending: Physical Medicine & Rehabilitation | Admitting: Physical Therapy

## 2016-09-01 ENCOUNTER — Encounter: Payer: Self-pay | Admitting: Physical Therapy

## 2016-09-01 DIAGNOSIS — I69354 Hemiplegia and hemiparesis following cerebral infarction affecting left non-dominant side: Secondary | ICD-10-CM | POA: Diagnosis not present

## 2016-09-01 DIAGNOSIS — R2681 Unsteadiness on feet: Secondary | ICD-10-CM | POA: Diagnosis not present

## 2016-09-01 DIAGNOSIS — R2689 Other abnormalities of gait and mobility: Secondary | ICD-10-CM

## 2016-09-01 DIAGNOSIS — M6281 Muscle weakness (generalized): Secondary | ICD-10-CM | POA: Insufficient documentation

## 2016-09-03 NOTE — Therapy (Signed)
[facility] [address] [place], [place], [address] Phone: [phone]   Fax:  [phone]  Physical Therapy Treatment  Patient Details  Name: [name] MRN: [id number] Date of Birth: 03-03-1946 Referring Provider: Dr. Alysia Penna  Encounter Date: 09/01/2016   09/01/16 1405  PT Visits / Re-Eval  Visit Number 15  Number of Visits 16  Date for PT Re-Evaluation 09/07/16  Authorization  Authorization Type Medicare-G Codes and Progress Notes  PT Time Calculation  PT Start Time 1402  PT Stop Time 1445  PT Time Calculation (min) 43 min  PT - End of Session  Equipment Utilized During Treatment Gait belt  Activity Tolerance Patient tolerated treatment well  Behavior During Therapy [facility] for tasks assessed/performed     Past Medical History:  Diagnosis Date  . Anemia 07/29/2013  . Asthma    childhood asthma  . Benign prostatic hypertrophy    several prostate biopsies neg for cancer.   . Bilateral hip pain   . Cancer ([place])   . CHF (congestive heart failure) ([place])   . Complication of anesthesia    MORPHINE CAUSES HALLUCINATIONS  . CVA (cerebral vascular accident) ([place]) 06/2016  . Depression   . FH: colonic polyps 2010  . Gout   . History of kidney stones   . Hypertension   . Kidney cysts   . Morbid obesity ([place])   . Numbness    feet / legs  . PAF (paroxysmal atrial fibrillation) with RVR 07/29/2013  . Sleep apnea    USES C PAP    Past Surgical History:  Procedure Laterality Date  . ARTHROSCOPY KNEE W/ DRILLING    . BREATH TEK H PYLORI N/A 12/23/2012   Procedure: BREATH TEK H PYLORI;  Surgeon: Gayland Curry, MD;  Location[facility];  Service: General;  Laterality: N/A;  . CHOLECYSTECTOMY  1989  . COLONOSCOPY N/A 07/31/2013   Procedure: COLONOSCOPY;  Surgeon: Gatha Mayer, MD;  Location: [facility];  Service: Endoscopy;  Laterality: N/A;  . ESOPHAGOGASTRODUODENOSCOPY N/A 07/22/2013   Procedure: ESOPHAGOGASTRODUODENOSCOPY (EGD);  Surgeon: Irene Shipper, MD;  [facility] END[facility]COPY;  Service: Endoscopy;  Laterality: N/A;  . ESOPHAGOGASTRODUODENOSCOPY N/A 07/31/2013   Procedure: ESOPHAGOGASTRODUODENOSCOPY (EGD);  Surgeon: Gatha Mayer, MD;  Location: [facility] ENDOSCOPY;  Service: Endoscopy;  Laterality: N/A;  . INTRAOPERATIVE TRANSESOPHAGEAL ECHOCARDIOGRAM N/A 07/18/2013   Procedure: INTRAOPERATIVE TRANSESOPHAGEAL ECHOCARDIOGRAM;  Surgeon: Grace Isaac, MD;  Location: [facility];  Service: Open Heart Surgery;  Laterality: N/A;  . LAPAROSCOPIC GASTRIC BANDING WITH HIATAL HERNIA REPAIR N/A 04/08/2013   Procedure: LAPAROSCOPIC GASTRIC BANDING WITH possible  HIATAL HERNIA REPAIR;  Surgeon: Gayland Curry, MD;  [facility];  Service: General;  Laterality: N/A;  . LITHOTRIPSY    . renal calculi    . SUBXYPHOID PERICARDIAL WINDOW N/A 07/18/2013   Procedure: SUBXYPHOID PERICARDIAL WINDOW;  Surgeon: Grace Isaac, MD;  Location: [facility];  Service: Thoracic;  Laterality: N/A;  . TONSILLECTOMY    . total knee replacment     bilat  . trigger finger repair     also lue, cts surgically repaired  . WISDOM TOOTH EXTRACTION      There were no vitals filed for this visit.     09/01/16 1404  Symptoms/Limitations  Subjective No new compaints. No falls or pain to report. Back on rollator on arrival to clinic today with much improved balance  Patient is accompained by: Family member  Pertinent History asthma,  basal cell carcinoma, obesity, gout, depression, HTN, OSA, peripheral neuropathy, CHF, PAF, PE, bil TKA  Limitations Walking  Patient Stated Goals improve walking, balance, wants to drive again and be independent  Pain Assessment  Currently in Pain? No/denies  Pain Score 0      09/01/16 1406  Balance Exercises: Standing  Standing Eyes Closed Wide (BOA);Head turns;Foam/compliant surface;Other reps (comment);Limitations  Rockerboard Anterior/posterior;Lateral;Head turns;EO;EC;10  seconds;20 seconds;10 reps  Balance Beam standing across red beam: alternating fwd stepping to floor and back onto beam x 10 reps each leg, light UE support on bars. cues on posture, weight shifitng and step height.               Other Standing Exercises standing on airex in parallel bars: alternating UE raises, progressing to bil UE raises x 10 reps each with min guard assist for balance; with none to light fingertip support on bars- heel /toe raises x 10 reps, alternating slow marching x 10 reps and mini squats x 7 reps, min guard to min assist for balance.                            Balance Exercises: Standing  Rebounder Limitations performed both ways on balance board with none to light UE support on parallel bars: rocking board with EO with emphasis on tall posture/maintaining midline; holding board steady: EC no head movements, progressing to EC head movements left<>right and up<>down x 10 reps each.                       Standing Eyes Closed Limitations standing across red beam with wide base of support: EC no head movements 15 sec's x 3 reps, progressing to EC head movements left<>right and up<>down x 10 reps each. light fingertip support on bars with min to mod assist for balance. cues on posture and weight shifting to assist with balance.                 PT Short Term Goals - 08/08/16 1452      PT SHORT TERM GOAL #1   Title verbalize understanding of CVA risk factors/warning signs (08/10/16)   Status Achieved     PT SHORT TERM GOAL #2   Title improve timed up and go to < 30 sec for improved mobility (08/10/16)   Status Achieved     PT SHORT TERM GOAL #3   Title improve gait velocity to > 1.32 ft/sec for improved mobility and community access (08/10/16)   Status Achieved     PT SHORT TERM GOAL #4   Title amb > 300' on level indoor/paved outdoor surfaces with LRAD with supervision for improved community access (08/10/16)   Status Achieved     PT SHORT TERM GOAL #5   Title perform  BERG with appropriate LTG to be written (08/10/16)   Status Achieved           PT Long Term Goals - 08/14/16 1700      PT LONG TERM GOAL #1   Title independent with HEP (09/07/16)   Time 8   Period Weeks   Status On-going     PT LONG TERM GOAL #2   Title improve timed up and go to < 21 sec with LRAD for improved mobility (09/07/16)   Time 8   Period Weeks   Status On-going     PT LONG TERM GOAL #3   Title improve  gait velocity to > 1.8 ft/sec for improved mobility and decreased fall risk (09/07/16)   Time 8   Period Weeks   Status On-going     PT LONG TERM GOAL #4   Title amb > 500' on various indoor/outdoor surfaces with LRAD modified independent for improved independence and mobility (09/07/16)   Time 8   Period Weeks   Status On-going     PT LONG TERM GOAL #5   Title Pt will decrease falls risk as indicated by an increase in BERG balance score to > or = 40/56 (goal revised on 08/14/2016)   Baseline 36/56; goal upgraded due to progress   Time 8   Period Weeks   Status Revised        09/01/16 1405  Plan  Clinical Impression Statement Today's skilled session focused on balance activities with pt reporting dizziness at times that resolved quickly with seated rest breaks. Pt does fatigue quickly needing short, seated rest breaks to recover. Pt is progressing toward goals and should benefit from continued PT to progress toward unmet goals.   Pt will benefit from skilled therapeutic intervention in order to improve on the following deficits Abnormal gait;Decreased balance;Decreased activity tolerance;Decreased strength;Postural dysfunction;Obesity;Decreased endurance;Decreased knowledge of use of DME;Decreased mobility  Rehab Potential Good  Clinical Impairments Affecting Rehab Potential multiple comorbidities  PT Treatment/Interventions ADLs/Self Care Home Management;Cryotherapy;Electrical Stimulation;Neuromuscular re-education;Balance training;Therapeutic exercise;Therapeutic  activities;Functional mobility training;Gait training;Stair training;Patient/family education;Orthotic Fit/Training;Manual techniques;Vestibular;Energy conservation;Moist Heat  PT Next Visit Plan LTGs due by 09/07/16; gait with cane, including with head turns/movements, balance- emphasis on single leg stance and complaint surfaces;   Consulted and Agree with Plan of Care Patient;Family member/caregiver  Family Member Consulted wife        Patient will benefit from skilled therapeutic intervention in order to improve the following deficits and impairments:  Abnormal gait, Decreased balance, Decreased activity tolerance, Decreased strength, Postural dysfunction, Obesity, Decreased endurance, Decreased knowledge of use of DME, Decreased mobility  Visit Diagnosis: Other abnormalities of gait and mobility  Muscle weakness (generalized)  Unsteadiness on feet  Hemiplegia and hemiparesis following cerebral infarction affecting left non-dominant side [facility])     Problem List Patient Active Problem List   Diagnosis Date Noted  . Benign essential HTN   . 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 Embolic cerebral infarction ([place]) 06/21/2016  . Stroke syndrome ([place])   . OSA (obstructive sleep apnea)   . Dysphagia, post-stroke   . Ataxia, post-stroke   . Neurologic gait disorder   . Encounter for monitoring flecainide therapy 04/28/2016  . Pure hyperglyceridemia 04/26/2016  . Recurrent pulmonary emboli ([place]) 06/13/2015  . GERD (gastroesophageal reflux disease) 06/13/2015  . Asthma 06/13/2015  . Basal cell carcinoma of skin 06/28/2014  . 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 Sinus bradycardia 12/15/2013  . PAF (paroxysmal atrial fibrillation) ([place]) 07/29/2013  . Chronic anticoagulation 07/22/2013  . Gastric ulcer with hemorrhage 07/20/13 07/22/2013  . Diastolic congestive heart failure ([place]) 07/16/2013  . H/O laparoscopic adjustable gastric banding & hiatal hernia repair 04/08/13  04/23/2013  . Prediabetes 02/05/2013  . TMJ (temporomandibular joint syndrome) 09/11/2012  . Morbid obesity ([place]) 08/09/2012  . Peripheral neuropathy ([place]) 11/02/2009  . Hyperlipidemia with target LDL less than 130 09/10/2008  . Depression 09/10/2008  . GOUT 06/28/2006  . BPH (benign prostatic hyperplasia) 06/28/2006    Willow Ora, PTA, [facility] Outpatient Neuro [facility] [address], [place], [name] [address] [phone] 09/03/16, 8:45 PM   Name: [name]  MRN: [id number] Date of Birth: 1945-08-31

## 2016-09-04 ENCOUNTER — Encounter: Payer: Self-pay | Admitting: Physical Therapy

## 2016-09-04 ENCOUNTER — Other Ambulatory Visit: Payer: Self-pay | Admitting: Internal Medicine

## 2016-09-04 ENCOUNTER — Ambulatory Visit: Payer: Medicare Other | Admitting: Physical Therapy

## 2016-09-04 DIAGNOSIS — I69354 Hemiplegia and hemiparesis following cerebral infarction affecting left non-dominant side: Secondary | ICD-10-CM

## 2016-09-04 DIAGNOSIS — M6281 Muscle weakness (generalized): Secondary | ICD-10-CM | POA: Diagnosis not present

## 2016-09-04 DIAGNOSIS — R2689 Other abnormalities of gait and mobility: Secondary | ICD-10-CM | POA: Diagnosis not present

## 2016-09-04 DIAGNOSIS — R2681 Unsteadiness on feet: Secondary | ICD-10-CM | POA: Diagnosis not present

## 2016-09-04 NOTE — Therapy (Signed)
[facility] [address] [place], [place], [address] Phone: [phone]   Fax:  [phone]  Physical Therapy Treatment  Patient Details  Name: [name] MRN: [id number] Date of Birth: 03/25/1946 Referring Provider: Dr. Alysia Penna  Encounter Date: 09/04/2016      PT End of Session - 09/04/16 1924    Visit Number 16   Number of Visits 33  per recertification   Date for PT Re-Evaluation 67/20/94  per recertification   Authorization Type Medicare-G Codes and Progress Notes every 10th visit   PT Start Time 1400   PT Stop Time 1445   PT Time Calculation (min) 45 min   Equipment Utilized During Treatment Gait belt   Activity Tolerance Patient tolerated treatment well   Behavior During Therapy [facility] for tasks assessed/performed      Past Medical History:  Diagnosis Date  . Anemia 07/29/2013  . Asthma    childhood asthma  . Benign prostatic hypertrophy    several prostate biopsies neg for cancer.   . Bilateral hip pain   . Cancer ([place])   . CHF (congestive heart failure) ([place])   . Complication of anesthesia    MORPHINE CAUSES HALLUCINATIONS  . CVA (cerebral vascular accident) ([place]) 06/2016  . Depression   . FH: colonic polyps 2010  . Gout   . History of kidney stones   . Hypertension   . Kidney cysts   . Morbid obesity ([place])   . Numbness    feet / legs  . PAF (paroxysmal atrial fibrillation) with RVR 07/29/2013  . Sleep apnea    USES C PAP    Past Surgical History:  Procedure Laterality Date  . ARTHROSCOPY KNEE W/ DRILLING    . BREATH TEK H PYLORI N/A 12/23/2012   Procedure: BREATH TEK H PYLORI;  Surgeon: Gayland Curry, MD;  Location: [facility];  Service: General;  Laterality: N/A;  . CHOLECYSTECTOMY  1989  . COLONOSCOPY N/A 07/31/2013   Procedure: COLONOSCOPY;  Surgeon: Gatha Mayer, MD;  Location: [facility];  Service: Endoscopy;  Laterality: N/A;  . ESOPHAGOGASTRODUODENOSCOPY N/A  07/22/2013   Procedure: ESOPHAGOGASTRODUODENOSCOPY (EGD);  Surgeon: Irene Shipper, MD;  Location: [facility]OSCOPY;  Service: Endoscopy;  Laterality: N/A;  . ESOPHAGOGASTRODUODENOSCOPY N/A 07/31/2013   Procedure: ESOPHAGOGASTRODUODENOSCOPY (EGD);  Surgeon: Gatha Mayer, MD;  Location: [facility] ENDOSCOPY;  Service: Endoscopy;  Laterality: N/A;  . INTRAOPERATIVE TRANSESOPHAGEAL ECHOCARDIOGRAM N/A 07/18/2013   Procedure: INTRAOPERATIVE TRANSESOPHAGEAL ECHOCARDIOGRAM;  Surgeon: Grace Isaac, MD;  Location: [facility];  Service: Open Heart Surgery;  Laterality: N/A;  . LAPAROSCOPIC GASTRIC BANDING WITH HIATAL HERNIA REPAIR N/A 04/08/2013   Procedure: LAPAROSCOPIC GASTRIC BANDING WITH possible  HIATAL HERNIA REPAIR;  Surgeon: Gayland Curry, MD;  Location: [facility];  Service: General;  Laterality: N/A;  . LITHOTRIPSY    . renal calculi    . SUBXYPHOID PERICARDIAL WINDOW N/A 07/18/2013   Procedure: SUBXYPHOID PERICARDIAL WINDOW;  Surgeon: Grace Isaac, MD;  Location: [facility];  Service: Thoracic;  Laterality: N/A;  . TONSILLECTOMY    . total knee replacment     bilat  . trigger finger repair     also lue, cts surgically repaired  . WISDOM TOOTH EXTRACTION      There were no vitals filed for this visit.      Subjective Assessment - 09/04/16 1403    Subjective No falls, feels the balance is getting better but still needs rollator outside the house;  continues to use cane in the house but feels he has to touch walls or furniture for added stability.   Patient is accompained by: Family member   Pertinent History asthma, basal cell carcinoma, obesity, gout, depression, HTN, OSA, peripheral neuropathy, CHF, PAF, PE, bil TKA   Limitations Walking   Patient Stated Goals improve walking, balance, wants to drive again and be independent   Currently in Pain? No/denies            [facility] PT Assessment - 09/04/16 1410      Ambulation/Gait   Ambulation/Gait Yes   Ambulation/Gait Assistance 4: Min guard    Ambulation/Gait Assistance Details Gait outside with quad cane over uneven pavement with min A for balance due to poor control of balance during changes in direction, inclines and increased gait speeds   Ambulation Distance (Feet) 500 Feet   Assistive device Small based quad cane   Gait Pattern Step-through pattern;Decreased step length - right;Decreased step length - left;Decreased stance time - left;Decreased stance time - right;Decreased stride length;Decreased weight shift to right;Decreased weight shift to left;Decreased trunk rotation;Trunk flexed;Wide base of support   Ambulation Surface Unlevel;Outdoor;Paved     Standardized Balance Assessment   Standardized Balance Assessment Berg Balance Test;Timed Up and Go Test;10 meter walk test   10 Meter Walk 11.35 seconds or 2.38f/sec with rollator; 12.96 seconds or 2.55fsec with quad cane     Berg Balance Test   Sit to Stand Able to stand without using hands and stabilize independently   Standing Unsupported Able to stand safely 2 minutes   Sitting with Back Unsupported but Feet Supported on Floor or Stool Able to sit safely and securely 2 minutes   Stand to Sit Sits safely with minimal use of hands   Transfers Able to transfer safely, minor use of hands   Standing Unsupported with Eyes Closed Able to stand 10 seconds with supervision   Standing Ubsupported with Feet Together Able to place feet together independently and stand for 1 minute with supervision   From Standing, Reach Forward with Outstretched Arm Can reach confidently >25 cm (10")   From Standing Position, Pick up Object from Floor Able to pick up shoe, needs supervision   From Standing Position, Turn to Look Behind Over each Shoulder Needs supervision when turning   Turn 360 Degrees Needs close supervision or verbal cueing   Standing Unsupported, Alternately Place Feet on Step/Stool Able to complete >2 steps/needs minimal assist   Standing Unsupported, One Foot in Front Able to  take small step independently and hold 30 seconds   Standing on One Leg Tries to lift leg/unable to hold 3 seconds but remains standing independently   Total Score 39   Berg comment: 39/56     Timed Up and Go Test   TUG Normal TUG   Normal TUG (seconds) 12.22  12.34 with RW, 12.22 with quad cane   TUG Comments 12.34 with RW, 12.22 seconds with quad cane       Patient demonstrates increased fall risk as noted by score of 39/56 on Berg Balance Scale.  (<36= high risk for falls, close to 100%; 37-45 significant >80%; 46-51 moderate >50%; 52-55 lower >25%)                        PT Education - 09/04/16 1923    Education provided Yes   Education Details goals achieved, progress made and areas to continue to focus on for next 8  weeks   Person(s) Educated Patient;Spouse   Methods Explanation;Demonstration   Comprehension Verbalized understanding          PT Short Term Goals - 08/08/16 1452      PT SHORT TERM GOAL #1   Title verbalize understanding of CVA risk factors/warning signs (08/10/16)   Status Achieved     PT SHORT TERM GOAL #2   Title improve timed up and go to < 30 sec for improved mobility (08/10/16)   Status Achieved     PT SHORT TERM GOAL #3   Title improve gait velocity to > 1.32 ft/sec for improved mobility and community access (08/10/16)   Status Achieved     PT SHORT TERM GOAL #4   Title amb > 300' on level indoor/paved outdoor surfaces with LRAD with supervision for improved community access (08/10/16)   Status Achieved     PT SHORT TERM GOAL #5   Title perform BERG with appropriate LTG to be written (08/10/16)   Status Achieved           PT Long Term Goals - 09/04/16 1406      PT LONG TERM GOAL #1   Title independent with HEP (NEW TARGET DATE OF 11/03/2016)   Time 8   Period Weeks   Status On-going     PT LONG TERM GOAL #2   Title improve timed up and go to < 21 sec with LRAD for improved mobility (09/07/16)   Baseline with RW 12.34  seconds; 12.22 seconds with quad cane    Time 8   Period Weeks   Status Achieved     PT LONG TERM GOAL #3   Title improve gait velocity to > 1.8 ft/sec for improved mobility and decreased fall risk (09/07/16)   Baseline 2.414f/sec with quad cane, 2.849fsec with rollator   Time 8   Period Weeks   Status Achieved     PT LONG TERM GOAL #4   Title amb > 500' on various indoor/outdoor surfaces and will negotiate up/down 7 stairs with cane and rail with supervision for improved independence and mobility (11/03/16)   Baseline 500' on outdoor pavement (uneven) with quad cane and min A; rollator with supervision   Time 8   Period Weeks   Status Revised     PT LONG TERM GOAL #5   Title Pt will decrease falls risk as indicated by an increase in BERG balance score to > or = 42/56 (11/03/16)   Baseline 39/56 on 09/04/2016; pt making progress just not to goal level yet   Time 8   Period Weeks   Status Revised     Additional Long Term Goals   Additional Long Term Goals Yes     PT LONG TERM GOAL #6   Title Pt will improve gait velocity with quad cane to > or = 2.14f24fec for decreased falls risk during household and limited community ambulation with LRAD (11/03/16)   Baseline 2.53 with quad cane   Time 8   Period Weeks   Status New               Plan - 09/04/16 1928    Clinical Impression Statement Treatment session today focused on re-assessment of progress towards LTG and plan for recertification.  Pt has made good progress and has met 2/5 LTG with improvements in vision, postural control and reorientation to midline, balance, gait and decreased falls risk.  Pt has made a safe transition from the regular RW to rollator  and gait training for transition to quad cane has been initiated.  Pt continues to present with impaired balance and weight shifting, impaired gait with quad cane, impaired endurance and increased falls risk as indicated by BERG balance score.  Pt will benefit from ongoing PT  services to continue to address impairments and improve functional mobility independence and decrease falls risk.   Rehab Potential Good   Clinical Impairments Affecting Rehab Potential multiple comorbidities   PT Frequency 2x / week   PT Duration 8 weeks   PT Treatment/Interventions ADLs/Self Care Home Management;Cryotherapy;Electrical Stimulation;Neuromuscular re-education;Balance training;Therapeutic exercise;Therapeutic activities;Functional mobility training;Gait training;Stair training;Patient/family education;Orthotic Fit/Training;Manual techniques;Vestibular;Energy conservation;Moist Heat;DME Instruction   PT Next Visit Plan gait with cane, outside surfaces, including with head turns/movements, balance- emphasis on single leg stance and complaint surfaces, SLS, partial tandem stance   Consulted and Agree with Plan of Care Patient;Family member/caregiver   Family Member Consulted wife      Patient will benefit from skilled therapeutic intervention in order to improve the following deficits and impairments:  Abnormal gait, Decreased balance, Decreased activity tolerance, Decreased strength, Postural dysfunction, Obesity, Decreased endurance, Decreased knowledge of use of DME, Decreased mobility, Difficulty walking, Dizziness, Decreased coordination, Impaired sensation, Impaired vision/preception  Visit Diagnosis: Hemiplegia and hemiparesis following cerebral infarction affecting left non-dominant side (HCC)  Other abnormalities of gait and mobility  Muscle weakness (generalized)  Unsteadiness on feet     Problem List Patient Active Problem List   Diagnosis Date Noted  . Benign essential HTN   . 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 Embolic cerebral infarction ([place]) 06/21/2016  . Stroke syndrome ([place])   . OSA (obstructive sleep apnea)   . Dysphagia, post-stroke   . Ataxia, post-stroke   . Neurologic gait disorder   . Encounter for monitoring flecainide therapy 04/28/2016  .  Pure hyperglyceridemia 04/26/2016  . Recurrent pulmonary emboli ([place]) 06/13/2015  . GERD (gastroesophageal reflux disease) 06/13/2015  . Asthma 06/13/2015  . Basal cell carcinoma of skin 06/28/2014  . 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 Sinus bradycardia 12/15/2013  . PAF (paroxysmal atrial fibrillation) ([place]) 07/29/2013  . Chronic anticoagulation 07/22/2013  . Gastric ulcer with hemorrhage 07/20/13 07/22/2013  . Diastolic congestive heart failure ([place]) 07/16/2013  . H/O laparoscopic adjustable gastric banding & hiatal hernia repair 04/08/13 04/23/2013  . Prediabetes 02/05/2013  . TMJ (temporomandibular joint syndrome) 09/11/2012  . Morbid obesity ([place]) 08/09/2012  . Peripheral neuropathy ([place]) 11/02/2009  . Hyperlipidemia with target LDL less than 130 09/10/2008  . Depression 09/10/2008  . GOUT 06/28/2006  . BPH (benign prostatic hyperplasia) 06/28/2006    Raylene Everts, PT, DPT 09/04/16    7:40 PM    [facility] [address] [place], [place], [address] Phone: [phone]   Fax:  [phone]  Name: OVIDE DUSEK MRN: [id number] Date of Birth: 1945-12-08

## 2016-09-06 ENCOUNTER — Ambulatory Visit: Payer: Medicare Other | Admitting: Physical Therapy

## 2016-09-08 ENCOUNTER — Encounter: Payer: Self-pay | Admitting: Physical Medicine & Rehabilitation

## 2016-09-08 ENCOUNTER — Ambulatory Visit (HOSPITAL_BASED_OUTPATIENT_CLINIC_OR_DEPARTMENT_OTHER): Payer: Medicare Other | Admitting: Physical Medicine & Rehabilitation

## 2016-09-08 ENCOUNTER — Encounter: Payer: Medicare Other | Attending: Physical Medicine & Rehabilitation

## 2016-09-08 VITALS — BP 115/76 | HR 62 | Resp 14

## 2016-09-08 DIAGNOSIS — G463 Brain stem stroke syndrome: Secondary | ICD-10-CM | POA: Insufficient documentation

## 2016-09-08 DIAGNOSIS — I10 Essential (primary) hypertension: Secondary | ICD-10-CM | POA: Insufficient documentation

## 2016-09-08 DIAGNOSIS — G4733 Obstructive sleep apnea (adult) (pediatric): Secondary | ICD-10-CM | POA: Insufficient documentation

## 2016-09-08 DIAGNOSIS — Z7901 Long term (current) use of anticoagulants: Secondary | ICD-10-CM | POA: Diagnosis not present

## 2016-09-08 DIAGNOSIS — I4891 Unspecified atrial fibrillation: Secondary | ICD-10-CM | POA: Diagnosis not present

## 2016-09-08 DIAGNOSIS — R269 Unspecified abnormalities of gait and mobility: Secondary | ICD-10-CM | POA: Diagnosis not present

## 2016-09-08 NOTE — Progress Notes (Signed)
Subjective:    Patient ID: [name], male    DOB: 08-27-1945, 71 y.o.   MRN: [id number]  HPI Seen by psychiatry risperdal d/ced and restarted on lorazepam Sees Dr Adele Schilder every 3 months,  Losing keys and other items more often Going to OP therapy twice week.    Mod I ADL No falls             Pain Inventory Average Pain 1 Pain Right Now 1 My pain is other  In the last 24 hours, has pain interfered with the following? General activity 0 Relation with others 0 Enjoyment of life 0 What TIME of day is your pain at its worst? no selection Sleep (in general) Good  Pain is worse with: walking and standing Pain improves with: no selection Relief from Meds: no selection  Mobility walk with assistance use a walker ability to climb steps?  yes do you drive?  no  Function retired  Neuro/Psych numbness trouble walking  Prior Studies Any changes since last visit?  no  Physicians involved in your care Any changes since last visit?  no   Family History  Problem Relation Age of Onset  . Depression Mother   . Hypertension Mother   . Heart disease Father        MI  . Depression Brother   . Stroke Paternal Aunt        CVA  . Diabetes Paternal Uncle   . Heart disease Paternal Uncle        MI  . Heart disease Paternal Grandmother        MI  . Diabetes Cousin        PATERNAL   Social History   Social History  . Marital status: Married    Spouse name: N/A  . Number of children: 1  . Years of education: N/A   Occupational History  . retired-Manager [employer]    Social History Main Topics  . Smoking status: Former Smoker    Packs/day: 0.20    Years: 13.00    Types: Cigarettes    Start date: 04/03/1961    Quit date: 08/30/1974  . Smokeless tobacco: Never Used  . Alcohol use No     Comment: Occasionally; 1 small drink a week  . Drug use: No  . Sexual activity: Not Currently   Other Topics Concern  . None   Social History Narrative  . None    Past Surgical History:  Procedure Laterality Date  . ARTHROSCOPY KNEE W/ DRILLING    . BREATH TEK H PYLORI N/A 12/23/2012   Procedure: BREATH TEK H PYLORI;  Surgeon: Gayland Curry, MD;  Location: [facility];  Service: General;  Laterality: N/A;  . CHOLECYSTECTOMY  1989  . COLONOSCOPY N/A 07/31/2013   Procedure: COLONOSCOPY;  Surgeon: Gatha Mayer, MD;  Location: [facility];  Service: Endoscopy;  Laterality: N/A;  . ESOPHAGOGASTRODUODENOSCOPY N/A 07/22/2013   Procedure: ESOPHAGOGASTRODUODENOSCOPY (EGD);  Surgeon: Irene Shipper, MD;  Location: [facility] END[facility]COPY;  Service: Endoscopy;  Laterality: N/A;  . ESOPHAGOGASTRODUODENOSCOPY N/A 07/31/2013   Procedure: ESOPHAGOGASTRODUODENOSCOPY (EGD);  Surgeon: Gatha Mayer, MD;  Location: [facility] ENDOSCOPY;  Service: Endoscopy;  Laterality: N/A;  . INTRAOPERATIVE TRANSESOPHAGEAL ECHOCARDIOGRAM N/A 07/18/2013   Procedure: INTRAOPERATIVE TRANSESOPHAGEAL ECHOCARDIOGRAM;  Surgeon: Grace Isaac, MD;  Location: [facility];  Service: Open Heart Surgery;  Laterality: N/A;  . LAPAROSCOPIC GASTRIC BANDING WITH HIATAL HERNIA REPAIR N/A 04/08/2013   Procedure: LAPAROSCOPIC GASTRIC BANDING WITH possible  HIATAL HERNIA REPAIR;  Surgeon: Gayland Curry, MD;  Location: [facility];  Service: General;  Laterality: N/A;  . LITHOTRIPSY    . renal calculi    . SUBXYPHOID PERICARDIAL WINDOW N/A 07/18/2013   Procedure: SUBXYPHOID PERICARDIAL WINDOW;  Surgeon: Grace Isaac, MD;  Location: [facility];  Service: Thoracic;  Laterality: N/A;  . TONSILLECTOMY    . total knee replacment     bilat  . trigger finger repair     also lue, cts surgically repaired  . WISDOM TOOTH EXTRACTION     Past Medical History:  Diagnosis Date  . Anemia 07/29/2013  . Asthma    childhood asthma  . Benign prostatic hypertrophy    several prostate biopsies neg for cancer.   . Bilateral hip pain   . Cancer ([place])   . CHF (congestive heart failure) ([place])   . Complication of anesthesia    MORPHINE CAUSES  HALLUCINATIONS  . CVA (cerebral vascular accident) ([place]) 06/2016  . Depression   . FH: colonic polyps 2010  . Gout   . History of kidney stones   . Hypertension   . Kidney cysts   . Morbid obesity ([place])   . Numbness    feet / legs  . PAF (paroxysmal atrial fibrillation) with RVR 07/29/2013  . Sleep apnea    USES C PAP   BP 115/76 (BP Location: Left Arm, Patient Position: Sitting, Cuff Size: Large)   Pulse 62   Resp 14   SpO2 92%   Opioid Risk Score:   Fall Risk Score:  `1  Depression screen PHQ 2/9  Depression screen [facility] 2/9 07/28/2016 02/16/2015 03/30/2014  Decreased Interest 0 0 0  Down, Depressed, Hopeless 0 0 0  PHQ - 2 Score 0 0 0  Altered sleeping 0 - -  Tired, decreased energy 1 - -  Change in appetite 0 - -  Feeling bad or failure about yourself  0 - -  Trouble concentrating 1 - -  Moving slowly or fidgety/restless 0 - -  PHQ-9 Score 2 - -  Difficult doing work/chores Not difficult at all - -  Some recent data might be hidden    Review of Systems  Constitutional: Positive for unexpected weight change.  HENT: Negative.   Eyes: Negative.   Respiratory: Positive for apnea.   Cardiovascular: Negative.   Gastrointestinal: Negative.   Endocrine: Negative.   Genitourinary: Negative.   Musculoskeletal: Positive for gait problem.  Skin: Negative.   Allergic/Immunologic: Negative.   Neurological: Positive for numbness.  Hematological: Negative.   Psychiatric/Behavioral: Negative.        Objective:   Physical Exam LefT facial and right UE numbness to pin prick  Patient has 5/5 strength in bilateral deltoid, biceps, triceps, grip, hip flexor, knee extensor, ankle dorsiflexor. No evidence of dysmetria, finger-nose-finger No evidence of dysdiadochokinesis. Is able to stand with feet together without evidence of left lateral pulsation. Vocal quality remains hoarse Ambulates with a walker      Assessment & Plan:  1. 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

## 2016-09-08 NOTE — Patient Instructions (Signed)
WALLENBERG SYNDROME

## 2016-09-11 ENCOUNTER — Encounter: Payer: Self-pay | Admitting: Physical Therapy

## 2016-09-11 ENCOUNTER — Ambulatory Visit: Payer: Medicare Other | Admitting: Physical Therapy

## 2016-09-11 DIAGNOSIS — I69354 Hemiplegia and hemiparesis following cerebral infarction affecting left non-dominant side: Secondary | ICD-10-CM | POA: Diagnosis not present

## 2016-09-11 DIAGNOSIS — R2689 Other abnormalities of gait and mobility: Secondary | ICD-10-CM

## 2016-09-11 DIAGNOSIS — R2681 Unsteadiness on feet: Secondary | ICD-10-CM

## 2016-09-11 DIAGNOSIS — M6281 Muscle weakness (generalized): Secondary | ICD-10-CM

## 2016-09-12 NOTE — Therapy (Signed)
[facility] [address] [place], [place], [address] Phone: [phone]   Fax:  [phone]  Physical Therapy Treatment  Patient Details  Name: [name] MRN: [id number] Date of Birth: January 28, 1946 Referring Provider: Dr. Alysia Penna  Encounter Date: 09/11/2016      PT End of Session - 09/11/16 1110    Visit Number 17   Number of Visits 33  per recertification   Date for PT Re-Evaluation 31/54/00  per recertification   Authorization Type Medicare-G Codes and Progress Notes every 10th visit   PT Start Time 1105   PT Stop Time 1145   PT Time Calculation (min) 40 min   Equipment Utilized During Treatment Gait belt   Activity Tolerance Patient tolerated treatment well   Behavior During Therapy [facility] for tasks assessed/performed      Past Medical History:  Diagnosis Date  . Anemia 07/29/2013  . Asthma    childhood asthma  . Benign prostatic hypertrophy    several prostate biopsies neg for cancer.   . Bilateral hip pain   . Cancer ([place])   . CHF (congestive heart failure) ([place])   . Complication of anesthesia    MORPHINE CAUSES HALLUCINATIONS  . CVA (cerebral vascular accident) ([place]) 06/2016  . Depression   . FH: colonic polyps 2010  . Gout   . History of kidney stones   . Hypertension   . Kidney cysts   . Morbid obesity ([place])   . Numbness    feet / legs  . PAF (paroxysmal atrial fibrillation) with RVR 07/29/2013  . Sleep apnea    USES C PAP    Past Surgical History:  Procedure Laterality Date  . ARTHROSCOPY KNEE W/ DRILLING    . BREATH TEK H PYLORI N/A 12/23/2012   Procedure: BREATH TEK H PYLORI;  Surgeon: Gayland Curry, MD;  Location: [facility];  Service: General;  Laterality: N/A;  . CHOLECYSTECTOMY  1989  . COLONOSCOPY N/A 07/31/2013   Procedure: COLONOSCOPY;  Surgeon: Gatha Mayer, MD;  Location: [facility];  Service: Endoscopy;  Laterality: N/A;  . ESOPHAGOGASTRODUODENOSCOPY N/A  07/22/2013   Procedure: ESOPHAGOGASTRODUODENOSCOPY (EGD);  Surgeon: Irene Shipper, MD;  Location[facility] ENDOSCOPY;  Service: Endoscopy;  Laterality: N/A;  . ESOPHAGOGASTRODUODENOSCOPY N/A 07/31/2013   Procedure: ESOPHAGOGASTRODUODENOSCOPY (EGD);  Surgeon: Gatha Mayer, MD;  Location: [facility] ENDOSCOPY;  Service: Endoscopy;  Laterality: N/A;  . INTRAOPERATIVE TRANSESOPHAGEAL ECHOCARDIOGRAM N/A 07/18/2013   Procedure: INTRAOPERATIVE TRANSESOPHAGEAL ECHOCARDIOGRAM;  Surgeon: Grace Isaac, MD;  Location: [facility];  Service: Open Heart Surgery;  Laterality: N/A;  . LAPAROSCOPIC GASTRIC BANDING WITH HIATAL HERNIA REPAIR N/A 04/08/2013   Procedure: LAPAROSCOPIC GASTRIC BANDING WITH possible  HIATAL HERNIA REPAIR;  Surgeon: Gayland Curry, MD;  Location: [facility];  Service: General;  Laterality: N/A;  . LITHOTRIPSY    . renal calculi    . SUBXYPHOID PERICARDIAL WINDOW N/A 07/18/2013   Procedure: SUBXYPHOID PERICARDIAL WINDOW;  Surgeon: Grace Isaac, MD;  Location: [facility];  Service: Thoracic;  Laterality: N/A;  . TONSILLECTOMY    . total knee replacment     bilat  . trigger finger repair     also lue, cts surgically repaired  . WISDOM TOOTH EXTRACTION      There were no vitals filed for this visit.      Subjective Assessment - 09/11/16 1108    Subjective No new complaints. No falls or pain to report. HEP is going okay. Feels the  vision progress has plateued. Saw Dr. Letta Pate last Friday, no changes.    Patient is accompained by: Family member   Pertinent History asthma, basal cell carcinoma, obesity, gout, depression, HTN, OSA, peripheral neuropathy, CHF, PAF, PE, bil TKA   Limitations Walking   Patient Stated Goals improve walking, balance, wants to drive again and be independent   Currently in Pain? No/denies            [facility] Adult PT Treatment/Exercise - 09/11/16 1111      Transfers   Transfers Sit to Stand;Stand to Sit   Sit to Stand 5: Supervision;With upper extremity assist;From  chair/3-in-1;From bed   Sit to Stand Details Verbal cues for sequencing;Verbal cues for safe use of DME/AE   Sit to Stand Details (indicate cue type and reason) cues for equal LE use/weight bearing with standing   Stand to Sit 5: Supervision;With upper extremity assist;To chair/3-in-1;To bed   Stand to Sit Details (indicate cue type and reason) Verbal cues for sequencing;Verbal cues for precautions/safety   Stand to Sit Details cues for equal use of LE's to sit down in controlled manner     Ambulation/Gait   Ambulation/Gait Yes   Ambulation/Gait Assistance 5: Supervision;4: Min guard;4: Min assist   Ambulation/Gait Assistance Details gait around gym with obstacle/furniture negotiations with supervision. gait at back of gym with head movements with min guard to min assist for balance- see below for full details.    Ambulation Distance (Feet) 350 Feet  x1, 30 x4   Assistive device Small based quad cane   Gait Pattern Step-through pattern;Decreased step length - right;Decreased step length - left;Decreased stance time - left;Decreased stance time - right;Decreased stride length;Decreased weight shift to right;Decreased weight shift to left;Decreased trunk rotation;Trunk flexed;Wide base of support   Ambulation Surface Level;Indoor;Other (comment)  indoor compliant surfaces   Gait Comments gait along back of gym: forward gait with head movements left<>right and up<>down x 2 laps each with cane and min guard to min assist for balance. increased assistance needed with up<>down movements.             Balance Exercises - 09/11/16 1128      Balance Exercises: Standing   Standing Eyes Opened Wide (BOA);Foam/compliant surface;Head turns;Other reps (comment);Limitations  UE movements and head movements   Standing Eyes Closed Wide (BOA);Foam/compliant surface;Other reps (comment);20 secs;Limitations     Balance Exercises: Standing   Standing Eyes Opened Limitations on airex in parallel bars with  wide stance: EO for alternating UE raises, bil simulataneous UE raises and angel wings x 10 reps each; progressed to EO for head movements left<>right, up<>down and diagonals both ways x 10 reps each with min to mod assist for balance. cues on posture and weight shifting to assist balance.      Standing Eyes Closed Limitations on airex in parallal bars with no UE support: EC no head movements for 30 sec's x 3 reps with min assist for balance. cues on posture and weight shifting as well.              PT Short Term Goals - 08/08/16 1452      PT SHORT TERM GOAL #1   Title verbalize understanding of CVA risk factors/warning signs (08/10/16)   Status Achieved     PT SHORT TERM GOAL #2   Title improve timed up and go to < 30 sec for improved mobility (08/10/16)   Status Achieved     PT SHORT TERM GOAL #3  Title improve gait velocity to > 1.32 ft/sec for improved mobility and community access (08/10/16)   Status Achieved     PT SHORT TERM GOAL #4   Title amb > 300' on level indoor/paved outdoor surfaces with LRAD with supervision for improved community access (08/10/16)   Status Achieved     PT SHORT TERM GOAL #5   Title perform BERG with appropriate LTG to be written (08/10/16)   Status Achieved           PT Long Term Goals - 09/04/16 1406      PT LONG TERM GOAL #1   Title independent with HEP (NEW TARGET DATE OF 11/03/2016)   Time 8   Period Weeks   Status On-going     PT LONG TERM GOAL #2   Title improve timed up and go to < 21 sec with LRAD for improved mobility (09/07/16)   Baseline with RW 12.34 seconds; 12.22 seconds with quad cane    Time 8   Period Weeks   Status Achieved     PT LONG TERM GOAL #3   Title improve gait velocity to > 1.8 ft/sec for improved mobility and decreased fall risk (09/07/16)   Baseline 2.86ft/sec with quad cane, 2.65ft/sec with rollator   Time 8   Period Weeks   Status Achieved     PT LONG TERM GOAL #4   Title amb > 500' on various  indoor/outdoor surfaces and will negotiate up/down 7 stairs with cane and rail with supervision for improved independence and mobility (11/03/16)   Baseline 500' on outdoor pavement (uneven) with quad cane and min A; rollator with supervision   Time 8   Period Weeks   Status Revised     PT LONG TERM GOAL #5   Title Pt will decrease falls risk as indicated by an increase in BERG balance score to > or = 42/56 (11/03/16)   Baseline 39/56 on 09/04/2016; pt making progress just not to goal level yet   Time 8   Period Weeks   Status Revised     Additional Long Term Goals   Additional Long Term Goals Yes     PT LONG TERM GOAL #6   Title Pt will improve gait velocity with quad cane to > or = 2.41ft/sec for decreased falls risk during household and limited community ambulation with LRAD (11/03/16)   Baseline 2.53 with quad cane   Time 8   Period Weeks   Status New            Plan - 09/11/16 1111    Clinical Impression Statement Today's skilled session continued to focus on gait with small based quad cane and on balance reactions. Pt continued to be challenged with complaint surfaces and with vision removed. Pt is progressing towards goals and should benefit from continued PT to progress toward unmet goalsl    Rehab Potential Good   Clinical Impairments Affecting Rehab Potential multiple comorbidities   PT Frequency 2x / week   PT Duration 8 weeks   PT Treatment/Interventions ADLs/Self Care Home Management;Cryotherapy;Electrical Stimulation;Neuromuscular re-education;Balance training;Therapeutic exercise;Therapeutic activities;Functional mobility training;Gait training;Stair training;Patient/family education;Orthotic Fit/Training;Manual techniques;Vestibular;Energy conservation;Moist Heat;DME Instruction   PT Next Visit Plan gait with cane, outside surfaces, including with head turns/movements, balance- emphasis on single leg stance and complaint surfaces, SLS, partial tandem stance   Consulted and  Agree with Plan of Care Patient;Family member/caregiver   Family Member Consulted wife      Patient will benefit from skilled therapeutic  intervention in order to improve the following deficits and impairments:  Abnormal gait, Decreased balance, Decreased activity tolerance, Decreased strength, Postural dysfunction, Obesity, Decreased endurance, Decreased knowledge of use of DME, Decreased mobility, Difficulty walking, Dizziness, Decreased coordination, Impaired sensation, Impaired vision/preception  Visit Diagnosis: Hemiplegia and hemiparesis following cerebral infarction affecting left non-dominant side (HCC)  Other abnormalities of gait and mobility  Muscle weakness (generalized)  Unsteadiness on feet     Problem List Patient Active Problem List   Diagnosis Date Noted  . Benign essential HTN   . 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 Embolic cerebral infarction ([place]) 06/21/2016  . Stroke syndrome ([place])   . OSA (obstructive sleep apnea)   . Dysphagia, post-stroke   . Ataxia, post-stroke   . Neurologic gait disorder   . Encounter for monitoring flecainide therapy 04/28/2016  . Pure hyperglyceridemia 04/26/2016  . Recurrent pulmonary emboli ([place]) 06/13/2015  . GERD (gastroesophageal reflux disease) 06/13/2015  . Asthma 06/13/2015  . Basal cell carcinoma of skin 06/28/2014  . 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 Sinus bradycardia 12/15/2013  . PAF (paroxysmal atrial fibrillation) ([place]) 07/29/2013  . Chronic anticoagulation 07/22/2013  . Gastric ulcer with hemorrhage 07/20/13 07/22/2013  . Diastolic congestive heart failure ([place]) 07/16/2013  . H/O laparoscopic adjustable gastric banding & hiatal hernia repair 04/08/13 04/23/2013  . Prediabetes 02/05/2013  . TMJ (temporomandibular joint syndrome) 09/11/2012  . Morbid obesity ([place]) 08/09/2012  . Peripheral neuropathy ([place]) 11/02/2009  . Hyperlipidemia with target LDL less than 130 09/10/2008  . Depression 09/10/2008   . GOUT 06/28/2006  . BPH (benign prostatic hyperplasia) 06/28/2006    Willow Ora, PTA, [facility] [address] [place], [place] [address] [phone] 09/12/16, 11:49 AM   Name: [name] MRN: [id number] Date of Birth: 06-04-1945

## 2016-09-13 ENCOUNTER — Encounter: Payer: Self-pay | Admitting: Physical Therapy

## 2016-09-13 ENCOUNTER — Ambulatory Visit: Payer: Medicare Other | Admitting: Physical Therapy

## 2016-09-13 DIAGNOSIS — R2689 Other abnormalities of gait and mobility: Secondary | ICD-10-CM

## 2016-09-13 DIAGNOSIS — I69354 Hemiplegia and hemiparesis following cerebral infarction affecting left non-dominant side: Secondary | ICD-10-CM

## 2016-09-13 DIAGNOSIS — R2681 Unsteadiness on feet: Secondary | ICD-10-CM | POA: Diagnosis not present

## 2016-09-13 DIAGNOSIS — M6281 Muscle weakness (generalized): Secondary | ICD-10-CM | POA: Diagnosis not present

## 2016-09-13 NOTE — Patient Instructions (Signed)
[email or web address]  A stroke leaves physical and emotional damage - it can also zap energy and cause fatigue. Researchers report that up to 70 percent of survivors experience fatigue that includes overwhelming physical and/or mental tiredness or exhaustion.  Symptoms can include difficulty with self-control, emotions and memory. How severe and long-lasting fatigue is ranges from mild and seldom to overwhelming and constant. Some report feeling tired even after a good night's sleep and that symptoms never seem to get better. Others say they feel tired when they perform a task requiring physical or mental focus. Most report that fatigue occurs without warning and makes it harder to do daily, routine activities as well as social or work activities. Causes Because research in this area is limited, we aren't certain what causes fatigue, but there are several possible causes. Medical conditions a survivor has, such as heart disease, diabetes, respiratory disease, anemia, pre-stroke fatigue or migraines, can contribute to fatigue. That's because the stroke or medication side effects may worsen fatigue or even mask it. Sleep apnea is also common among survivors. It is reported at high rates among those who report post-stroke fatigue, although no solid relationship has been proven. Poor heart health may also play a part due to higher levels of exertion. Survivors expend twice as much energy just standing upright and keeping their balance. Survivors are often concerned about falling and may feel unsure about doing some tasks. This stress can increase physical and mental demands and lead to fatigue. Lack of control in movement and walking appear to increase when a person is tired. Anxiety, stress and depression, which are common after stroke, are associated with lack of energy, although research has not determined their specific relationships to post-stroke fatigue.  Fighting post-stroke  fatigue Talk to your family and work with your healthcare team to determine the best plan of care for you. Here are some other tips: 1. Check your prescriptions for potential side effects, including fatigue. 2. Ask for treatment options if you are experiencing anxiety, depression or difficulty sleeping. Family support and understanding can also help. Let your family know that post-stroke fatigue is different from fatigue they've experienced. 3. Maintain good health to prevent or control other medical conditions, such as heart disease or diabetes, which can affect your energy level. Currently there is no prescription specifically for tiredness, although many related symptoms can be treated. 4. Talk to your physical therapist to understand fitness, balance disorders, uncoordinated movement and walking related to fatigue. He or she can create an exercise program to increase your endurance. Balance and coordination exercises will help you perform tasks with less energy, increase your confidence and decrease your anxiety. 5. Try to schedule demanding physical or mental activities throughout the day or week. That way you'll plan to take rest breaks before you feel tired and break up the concentrated periods of time that you're exerting yourself. 6. Consider modifying your home and work environment to make them more efficient. Use assistive technology when possible. Physical therapists can help patients reduce post-stroke fatigue. In most states, patients can make an appointment directly with a physical therapist, without a physician referral. Learn more about conditions physical therapists can treat and find a physical therapist in your area at [email or web address].

## 2016-09-14 NOTE — Therapy (Signed)
[facility] [address] [place], [place], [address] Phone: [phone]   Fax:  [phone]  Physical Therapy Treatment  Patient Details  Name: [name] MRN: [id number] Date of Birth: May 27, 1945 Referring Provider: Dr. Alysia Penna  Encounter Date: 09/13/2016      PT End of Session - 09/13/16 1107    Visit Number 18   Number of Visits 33  per recertification   Date for PT Re-Evaluation 83/38/25  per recertification   Authorization Type Medicare-G Codes and Progress Notes every 10th visit   PT Start Time 1104   PT Stop Time 1145   PT Time Calculation (min) 41 min   Equipment Utilized During Treatment Gait belt   Activity Tolerance Patient tolerated treatment well   Behavior During Therapy [facility] for tasks assessed/performed      Past Medical History:  Diagnosis Date  . Anemia 07/29/2013  . Asthma    childhood asthma  . Benign prostatic hypertrophy    several prostate biopsies neg for cancer.   . Bilateral hip pain   . Cancer ([place])   . CHF (congestive heart failure) ([place])   . Complication of anesthesia    MORPHINE CAUSES HALLUCINATIONS  . CVA (cerebral vascular accident) ([place]) 06/2016  . Depression   . FH: colonic polyps 2010  . Gout   . History of kidney stones   . Hypertension   . Kidney cysts   . Morbid obesity ([place])   . Numbness    feet / legs  . PAF (paroxysmal atrial fibrillation) with RVR 07/29/2013  . Sleep apnea    USES C PAP    Past Surgical History:  Procedure Laterality Date  . ARTHROSCOPY KNEE W/ DRILLING    . BREATH TEK H PYLORI N/A 12/23/2012   Procedure: BREATH TEK H PYLORI;  Surgeon: Gayland Curry, MD;  Location: [facility];  Service: General;  Laterality: N/A;  . CHOLECYSTECTOMY  1989  . COLONOSCOPY N/A 07/31/2013   Procedure: COLONOSCOPY;  Surgeon: Gatha Mayer, MD;  Location: [facility];  Service: Endoscopy;  Laterality: N/A;  . ESOPHAGOGASTRODUODENOSCOPY N/A  07/22/2013   Procedure: ESOPHAGOGASTRODUODENOSCOPY (EGD);  Surgeon: Irene Shipper, MD;  Location: [facility] ENDOSCOPY;  Service: Endoscopy;  Laterality: N/A;  . ESOPHAGOGASTRODUODENOSCOPY N/A 07/31/2013   Procedure: ESOPHAGOGASTRODUODENOSCOPY (EGD);  Surgeon: Gatha Mayer, MD;  Location: [facility] ENDOSCOPY;  Service: Endoscopy;  Laterality: N/A;  . INTRAOPERATIVE TRANSESOPHAGEAL ECHOCARDIOGRAM N/A 07/18/2013   Procedure: INTRAOPERATIVE TRANSESOPHAGEAL ECHOCARDIOGRAM;  Surgeon: Grace Isaac, MD;  Location: [place];  Service: Open Heart Surgery;  Laterality: N/A;  . LAPAROSCOPIC GASTRIC BANDING WITH HIATAL HERNIA REPAIR N/A 04/08/2013   Procedure: LAPAROSCOPIC GASTRIC BANDING WITH possible  HIATAL HERNIA REPAIR;  Surgeon: Gayland Curry, MD;  Location: [facility];  Service: General;  Laterality: N/A;  . LITHOTRIPSY    . renal calculi    . SUBXYPHOID PERICARDIAL WINDOW N/A 07/18/2013   Procedure: SUBXYPHOID PERICARDIAL WINDOW;  Surgeon: Grace Isaac, MD;  Location: [facility] [facility];  Service: Thoracic;  Laterality: N/A;  . TONSILLECTOMY    . total knee replacment     bilat  . trigger finger repair     also lue, cts surgically repaired  . WISDOM TOOTH EXTRACTION      There were no vitals filed for this visit.      Subjective Assessment - 09/13/16 1105    Subjective No new issues to report. Denies any pain or falls. Feeling sluggish today.  Patient is accompained by: Family member   Pertinent History asthma, basal cell carcinoma, obesity, gout, depression, HTN, OSA, peripheral neuropathy, CHF, PAF, PE, bil TKA   Patient Stated Goals improve walking, balance, wants to drive again and be independent   Currently in Pain? No/denies   Pain Score 0-No pain              Balance Exercises - 09/13/16 1120      Balance Exercises: Standing   Standing Eyes Opened Wide (BOA);Foam/compliant surface;Head turns;Other reps (comment);Limitations   Standing Eyes Closed Wide (BOA);Foam/compliant surface;Limitations;Other  reps (comment);Head turns  15 sec's no head movements   SLS with Vectors Foam/compliant surface;Other reps (comment);Upper extremity assist 1;Limitations   Balance Beam standing across blue foam beam in parallel bars:    Tandem Gait Forward;Retro;Upper extremity support;Foam/compliant surface;3 reps;Limitations   Marching Limitations slow, high knee marching fwd/bwd on blue mat in parallel bars with bil light fingertip support for balance     Balance Exercises: Standing   Standing Eyes Opened Limitations on airex in parallel bars: EO alternating UE raises, progressing to bil UE raises, progressing to angel wings x 10 reps each with min guard to min assist for balance; EO with arms at sides for head movements left<>right and up<>down x 10 reps each with min to mod assist for balance, no UE support on bars.    Standing Eyes Closed Limitations on airex in parallel bars with no UE support: EC no head movements, progressing to light fingertip support for EC with head movements left<>right and up<>down x 10 reps each. min to mod assist for balance.    SLS with Vectors Limitations 2 tall cones on blue mat in parallel bars with single fingertip support: alternating fwd toe taps, alternating cross toe taps, alternating fwd double toe taps and alternating cross double toe taps x 10 reps each with seated rest break x 1 in middle. cues on posture, weight shifting and increased lifting of foot for tapping.              PT Short Term Goals - 08/08/16 1452      PT SHORT TERM GOAL #1   Title verbalize understanding of CVA risk factors/warning signs (08/10/16)   Status Achieved     PT SHORT TERM GOAL #2   Title improve timed up and go to < 30 sec for improved mobility (08/10/16)   Status Achieved     PT SHORT TERM GOAL #3   Title improve gait velocity to > 1.32 ft/sec for improved mobility and community access (08/10/16)   Status Achieved     PT SHORT TERM GOAL #4   Title amb > 300' on level  indoor/paved outdoor surfaces with LRAD with supervision for improved community access (08/10/16)   Status Achieved     PT SHORT TERM GOAL #5   Title perform BERG with appropriate LTG to be written (08/10/16)   Status Achieved           PT Long Term Goals - 09/04/16 1406      PT LONG TERM GOAL #1   Title independent with HEP (NEW TARGET DATE OF 11/03/2016)   Time 8   Period Weeks   Status On-going     PT LONG TERM GOAL #2   Title improve timed up and go to < 21 sec with LRAD for improved mobility (09/07/16)   Baseline with RW 12.34 seconds; 12.22 seconds with quad cane    Time 8   Period  Weeks   Status Achieved     PT LONG TERM GOAL #3   Title improve gait velocity to > 1.8 ft/sec for improved mobility and decreased fall risk (09/07/16)   Baseline 2.59ft/sec with quad cane, 2.19ft/sec with rollator   Time 8   Period Weeks   Status Achieved     PT LONG TERM GOAL #4   Title amb > 500' on various indoor/outdoor surfaces and will negotiate up/down 7 stairs with cane and rail with supervision for improved independence and mobility (11/03/16)   Baseline 500' on outdoor pavement (uneven) with quad cane and min A; rollator with supervision   Time 8   Period Weeks   Status Revised     PT LONG TERM GOAL #5   Title Pt will decrease falls risk as indicated by an increase in BERG balance score to > or = 42/56 (11/03/16)   Baseline 39/56 on 09/04/2016; pt making progress just not to goal level yet   Time 8   Period Weeks   Status Revised     Additional Long Term Goals   Additional Long Term Goals Yes     PT LONG TERM GOAL #6   Title Pt will improve gait velocity with quad cane to > or = 2.38ft/sec for decreased falls risk during household and limited community ambulation with LRAD (11/03/16)   Baseline 2.53 with quad cane   Time 8   Period Weeks   Status New            Plan - 09/13/16 1107    Clinical Impression Statement Today's skilled session focused on balance reactions with  decreased UE support and on complaint surfaces. Pt is making steady progress toward goals and should benefit from continued PT to progress toward unmet goals.    Rehab Potential Good   Clinical Impairments Affecting Rehab Potential multiple comorbidities   PT Frequency 2x / week   PT Duration 8 weeks   PT Treatment/Interventions ADLs/Self Care Home Management;Cryotherapy;Electrical Stimulation;Neuromuscular re-education;Balance training;Therapeutic exercise;Therapeutic activities;Functional mobility training;Gait training;Stair training;Patient/family education;Orthotic Fit/Training;Manual techniques;Vestibular;Energy conservation;Moist Heat;DME Instruction   PT Next Visit Plan gait with cane, outside surfaces, including with head turns/movements, balance- emphasis on single leg stance and complaint surfaces, SLS, partial tandem stance   Consulted and Agree with Plan of Care Patient;Family member/caregiver   Family Member Consulted wife      Patient will benefit from skilled therapeutic intervention in order to improve the following deficits and impairments:  Abnormal gait, Decreased balance, Decreased activity tolerance, Decreased strength, Postural dysfunction, Obesity, Decreased endurance, Decreased knowledge of use of DME, Decreased mobility, Difficulty walking, Dizziness, Decreased coordination, Impaired sensation, Impaired vision/preception  Visit Diagnosis: Hemiplegia and hemiparesis following cerebral infarction affecting left non-dominant side (HCC)  Other abnormalities of gait and mobility  Muscle weakness (generalized)  Unsteadiness on feet     Problem List Patient Active Problem List   Diagnosis Date Noted  . Benign essential HTN   . 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 Embolic cerebral infarction ([place]) 06/21/2016  . Stroke syndrome ([place])   . OSA (obstructive sleep apnea)   . Dysphagia, post-stroke   . Ataxia, post-stroke   . Neurologic gait disorder   . Encounter for  monitoring flecainide therapy 04/28/2016  . Pure hyperglyceridemia 04/26/2016  . Recurrent pulmonary emboli ([place]) 06/13/2015  . GERD (gastroesophageal reflux disease) 06/13/2015  . Asthma 06/13/2015  . Basal cell carcinoma of skin 06/28/2014  . 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 Sinus bradycardia 12/15/2013  .  PAF (paroxysmal atrial fibrillation) ([place]) 07/29/2013  . Chronic anticoagulation 07/22/2013  . Gastric ulcer with hemorrhage 07/20/13 07/22/2013  . Diastolic congestive heart failure ([place]) 07/16/2013  . H/O laparoscopic adjustable gastric banding & hiatal hernia repair 04/08/13 04/23/2013  . Prediabetes 02/05/2013  . TMJ (temporomandibular joint syndrome) 09/11/2012  . Morbid obesity ([place]) 08/09/2012  . Peripheral neuropathy ([place]) 11/02/2009  . Hyperlipidemia with target LDL less than 130 09/10/2008  . Depression 09/10/2008  . GOUT 06/28/2006  . BPH (benign prostatic hyperplasia) 06/28/2006    Willow Ora, PTA, [facility] [address], [place], [place] [address] [phone] 09/14/16, 11:54 AM   Name: EMAURI KRYGIER MRN: [id number] Date of Birth: 02-Jun-1945

## 2016-09-18 ENCOUNTER — Ambulatory Visit: Payer: Medicare Other | Admitting: Physical Therapy

## 2016-09-18 ENCOUNTER — Encounter: Payer: Self-pay | Admitting: Physical Therapy

## 2016-09-18 VITALS — BP 122/81

## 2016-09-18 DIAGNOSIS — R2689 Other abnormalities of gait and mobility: Secondary | ICD-10-CM | POA: Diagnosis not present

## 2016-09-18 DIAGNOSIS — M6281 Muscle weakness (generalized): Secondary | ICD-10-CM

## 2016-09-18 DIAGNOSIS — I69354 Hemiplegia and hemiparesis following cerebral infarction affecting left non-dominant side: Secondary | ICD-10-CM | POA: Diagnosis not present

## 2016-09-18 DIAGNOSIS — R2681 Unsteadiness on feet: Secondary | ICD-10-CM | POA: Diagnosis not present

## 2016-09-19 NOTE — Therapy (Signed)
[facility] [address] [place], [place], [address] Phone: [phone]   Fax:  [phone]  Physical Therapy Treatment  Patient Details  Name: [name] MRN: [id number] Date of Birth: [birth year] Referring Provider: Dr. Alysia Penna  Encounter Date: 09/18/2016      PT End of Session - 09/18/16 1604    Visit Number 19   Number of Visits 33  per recertification   Date for PT Re-Evaluation 02/72/53  per recertification   Authorization Type Medicare-G Codes and Progress Notes every 10th visit   PT Start Time 1318   PT Stop Time 1400   PT Time Calculation (min) 42 min   Equipment Utilized During Treatment Gait belt   Activity Tolerance Patient tolerated treatment well   Behavior During Therapy [facility] for tasks assessed/performed      Past Medical History:  Diagnosis Date  . Anemia 07/29/2013  . Asthma    childhood asthma  . Benign prostatic hypertrophy    several prostate biopsies neg for cancer.   . Bilateral hip pain   . Cancer ([place])   . CHF (congestive heart failure) ([place])   . Complication of anesthesia    MORPHINE CAUSES HALLUCINATIONS  . CVA (cerebral vascular accident) ([place]) 06/2016  . Depression   . FH: colonic polyps 2010  . Gout   . History of kidney stones   . Hypertension   . Kidney cysts   . Morbid obesity ([place])   . Numbness    feet / legs  . PAF (paroxysmal atrial fibrillation) with RVR 07/29/2013  . Sleep apnea    USES C PAP    Past Surgical History:  Procedure Laterality Date  . ARTHROSCOPY KNEE W/ DRILLING    . BREATH TEK H PYLORI N/A 12/23/2012   Procedure: BREATH TEK H PYLORI;  Surgeon: Gayland Curry, MD;  Location: [facility];  Service: General;  Laterality: N/A;  . CHOLECYSTECTOMY  1989  . COLONOSCOPY N/A 07/31/2013   Procedure: COLONOSCOPY;  Surgeon: Gatha Mayer, MD;  Location: [place];  Service: Endoscopy;  Laterality: N/A;  . ESOPHAGOGASTRODUODENOSCOPY N/A  07/22/2013   Procedure: ESOPHAGOGASTRODUODENOSCOPY (EGD);  Surgeon: Irene Shipper, MD;  Location: [facility] ENDOSCOPY;  Service: Endoscopy;  Laterality: N/A;  . ESOPHAGOGASTRODUODENOSCOPY N/A 07/31/2013   Procedure: ESOPHAGOGASTRODUODENOSCOPY (EGD);  Surgeon: Gatha Mayer, MD;  Location: [facility] ENDOSCOPY;  Service: Endoscopy;  Laterality: N/A;  . INTRAOPERATIVE TRANSESOPHAGEAL ECHOCARDIOGRAM N/A 07/18/2013   Procedure: INTRAOPERATIVE TRANSESOPHAGEAL ECHOCARDIOGRAM;  Surgeon: Grace Isaac, MD;  Location: [place];  Service: Open Heart Surgery;  Laterality: N/A;  . LAPAROSCOPIC GASTRIC BANDING WITH HIATAL HERNIA REPAIR N/A 04/08/2013   Procedure: LAPAROSCOPIC GASTRIC BANDING WITH possible  HIATAL HERNIA REPAIR;  Surgeon: Gayland Curry, MD;  Location: [facility];  Service: General;  Laterality: N/A;  . LITHOTRIPSY    . renal calculi    . SUBXYPHOID PERICARDIAL WINDOW N/A 07/18/2013   Procedure: SUBXYPHOID PERICARDIAL WINDOW;  Surgeon: Grace Isaac, MD;  Location: [facility];  Service: Thoracic;  Laterality: N/A;  . TONSILLECTOMY    . total knee replacment     bilat  . trigger finger repair     also lue, cts surgically repaired  . WISDOM TOOTH EXTRACTION      Vitals:   09/18/16 1324  BP: 122/81        Subjective Assessment - 09/18/16 1320    Subjective Has had a headache since Friday. Has been at a steady pressure and  he reports feeling more slugglish than usual. Has not checked blood pressure because his home machine does not work. Did take a Tramadol that did not touch headache. Reports he has also been more unsteady than usual over weekend.                       Patient is accompained by: Family member   Pertinent History asthma, basal cell carcinoma, obesity, gout, depression, HTN, OSA, peripheral neuropathy, CHF, PAF, PE, bil TKA   Limitations Walking   Patient Stated Goals improve walking, balance, wants to drive again and be independent            [facility] Adult PT Treatment/Exercise - 09/18/16  1327      Transfers   Transfers Sit to Stand;Stand to Sit   Sit to Stand 5: Supervision;With upper extremity assist;From chair/3-in-1;From bed   Sit to Stand Details Verbal cues for sequencing;Verbal cues for safe use of DME/AE   Stand to Sit 5: Supervision;With upper extremity assist;To chair/3-in-1;To bed   Stand to Sit Details (indicate cue type and reason) Verbal cues for sequencing;Verbal cues for precautions/safety     Ambulation/Gait   Ambulation/Gait Yes   Ambulation/Gait Assistance 4: Min assist;4: Min guard   Ambulation/Gait Assistance Details cues on posture, step lenght and weight shifting. Pt short of breath afterwards with SaO2 decr to 88-89%, quick recovery to >/= 95% with seated rest/deep breathing.  SaO2 on second lap decr'd to 90% with recover to >/= 95% with seated rest break,    Ambulation Distance (Feet) 230 Feet  x2 reps   Assistive device Small based quad cane   Gait Pattern Step-through pattern;Decreased step length - right;Decreased step length - left;Decreased stance time - left;Decreased stance time - right;Decreased stride length;Decreased weight shift to right;Decreased weight shift to left;Decreased trunk rotation;Trunk flexed;Wide base of support   Ambulation Surface Level;Indoor        09/18/16 1346  Balance Exercises: Standing  Standing Eyes Opened Wide (BOA);Foam/compliant surface;Other reps (comment);Limitations (UE movements)  Standing Eyes Closed Wide (BOA);Foam/compliant surface;3 reps;20 secs;Limitations  Balance Exercises: Standing  Standing Eyes Opened Limitations on airex with no UE support: alternating UE raises, bil UE raises, angel wings and shoulder horizontal abduction x 10 reps each with min guard to min assist for balance. cues on posture and ex form.                         Standing Eyes Closed Limitations on airex with light fingertip support: EC no head movements for 20 sec's x 3 reps with min assist for balance.            PT  Short Term Goals - 08/08/16 1452      PT SHORT TERM GOAL #1   Title verbalize understanding of CVA risk factors/warning signs (08/10/16)   Status Achieved     PT SHORT TERM GOAL #2   Title improve timed up and go to < 30 sec for improved mobility (08/10/16)   Status Achieved     PT SHORT TERM GOAL #3   Title improve gait velocity to > 1.32 ft/sec for improved mobility and community access (08/10/16)   Status Achieved     PT SHORT TERM GOAL #4   Title amb > 300' on level indoor/paved outdoor surfaces with LRAD with supervision for improved community access (08/10/16)   Status Achieved     PT SHORT TERM GOAL #5  Title perform BERG with appropriate LTG to be written (08/10/16)   Status Achieved           PT Long Term Goals - 09/04/16 1406      PT LONG TERM GOAL #1   Title independent with HEP (NEW TARGET DATE OF 11/03/2016)   Time 8   Period Weeks   Status On-going     PT LONG TERM GOAL #2   Title improve timed up and go to < 21 sec with LRAD for improved mobility (09/07/16)   Baseline with RW 12.34 seconds; 12.22 seconds with quad cane    Time 8   Period Weeks   Status Achieved     PT LONG TERM GOAL #3   Title improve gait velocity to > 1.8 ft/sec for improved mobility and decreased fall risk (09/07/16)   Baseline 2.87ft/sec with quad cane, 2.20ft/sec with rollator   Time 8   Period Weeks   Status Achieved     PT LONG TERM GOAL #4   Title amb > 500' on various indoor/outdoor surfaces and will negotiate up/down 7 stairs with cane and rail with supervision for improved independence and mobility (11/03/16)   Baseline 500' on outdoor pavement (uneven) with quad cane and min A; rollator with supervision   Time 8   Period Weeks   Status Revised     PT LONG TERM GOAL #5   Title Pt will decrease falls risk as indicated by an increase in BERG balance score to > or = 42/56 (11/03/16)   Baseline 39/56 on 09/04/2016; pt making progress just not to goal level yet   Time 8   Period Weeks    Status Revised     Additional Long Term Goals   Additional Long Term Goals Yes     PT LONG TERM GOAL #6   Title Pt will improve gait velocity with quad cane to > or = 2.28ft/sec for decreased falls risk during household and limited community ambulation with LRAD (11/03/16)   Baseline 2.53 with quad cane   Time 8   Period Weeks   Status New        09/18/16 1604  Plan  Clinical Impression Statement Today's skilled session focused on gait with small based quad cane and standing balance. Pt continues to get short of breath with excertion. Checked SaO2 levels after gait with pt dropping each time below or right at 90%. Pt does have history of CHF and has not had a cardiology follow up since last fall which was prior to his stroke. Advised pt and spouse to call his cardiology office and let them know about his symptoms/recent stroke and see if they want to see him for a followup. Both verbalized agreement. Pt is progressing toward goals and should benefit from continued PT to progress toward unmet goals.               Pt will benefit from skilled therapeutic intervention in order to improve on the following deficits Abnormal gait;Decreased balance;Decreased activity tolerance;Decreased strength;Postural dysfunction;Obesity;Decreased endurance;Decreased knowledge of use of DME;Decreased mobility;Difficulty walking;Dizziness;Decreased coordination;Impaired sensation;Impaired vision/preception  Rehab Potential Good  Clinical Impairments Affecting Rehab Potential multiple comorbidities  PT Frequency 2x / week  PT Duration 8 weeks  PT Treatment/Interventions ADLs/Self Care Home Management;Cryotherapy;Electrical Stimulation;Neuromuscular re-education;Balance training;Therapeutic exercise;Therapeutic activities;Functional mobility training;Gait training;Stair training;Patient/family education;Orthotic Fit/Training;Manual techniques;Vestibular;Energy conservation;Moist Heat;DME Instruction  PT Next Visit  Plan G-code next visit; gait with cane, outside surfaces, including with head turns/movements, balance- emphasis on single  leg stance and complaint surfaces, SLS, partial tandem stance  Consulted and Agree with Plan of Care Patient;Family member/caregiver  Family Member Consulted wife      Patient will benefit from skilled therapeutic intervention in order to improve the following deficits and impairments:  Abnormal gait, Decreased balance, Decreased activity tolerance, Decreased strength, Postural dysfunction, Obesity, Decreased endurance, Decreased knowledge of use of DME, Decreased mobility, Difficulty walking, Dizziness, Decreased coordination, Impaired sensation, Impaired vision/preception  Visit Diagnosis: Hemiplegia and hemiparesis following cerebral infarction affecting left non-dominant side (HCC)  Other abnormalities of gait and mobility  Muscle weakness (generalized)     Problem List Patient Active Problem List   Diagnosis Date Noted  . Benign essential HTN   . 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 Embolic cerebral infarction ([place]) 06/21/2016  . Stroke syndrome ([place])   . OSA (obstructive sleep apnea)   . Dysphagia, post-stroke   . Ataxia, post-stroke   . Neurologic gait disorder   . Encounter for monitoring flecainide therapy 04/28/2016  . Pure hyperglyceridemia 04/26/2016  . Recurrent pulmonary emboli ([place]) 06/13/2015  . GERD (gastroesophageal reflux disease) 06/13/2015  . Asthma 06/13/2015  . Basal cell carcinoma of skin 06/28/2014  . 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 Sinus bradycardia 12/15/2013  . PAF (paroxysmal atrial fibrillation) ([place]) 07/29/2013  . Chronic anticoagulation 07/22/2013  . Gastric ulcer with hemorrhage 07/20/13 07/22/2013  . Diastolic congestive heart failure ([place]) 07/16/2013  . H/O laparoscopic adjustable gastric banding & hiatal hernia repair 04/08/13 04/23/2013  . Prediabetes 02/05/2013  . TMJ (temporomandibular joint  syndrome) 09/11/2012  . Morbid obesity ([place]) 08/09/2012  . Peripheral neuropathy ([place]) 11/02/2009  . Hyperlipidemia with target LDL less than 130 09/10/2008  . Depression 09/10/2008  . GOUT 06/28/2006  . BPH (benign prostatic hyperplasia) 06/28/2006    Willow Ora, PTA, [facility] Outpatient Neuro [facility] [address], [place], [place] [address] [phone] 09/20/16, 8:33 AM   Name: [name] MRN: [id number] Date of Birth: 18-Apr-1945

## 2016-09-22 ENCOUNTER — Ambulatory Visit: Payer: Medicare Other | Admitting: Physical Therapy

## 2016-09-22 ENCOUNTER — Encounter: Payer: Self-pay | Admitting: Physical Therapy

## 2016-09-22 VITALS — BP 120/80 | HR 70

## 2016-09-22 DIAGNOSIS — R2689 Other abnormalities of gait and mobility: Secondary | ICD-10-CM | POA: Diagnosis not present

## 2016-09-22 DIAGNOSIS — R2681 Unsteadiness on feet: Secondary | ICD-10-CM

## 2016-09-22 DIAGNOSIS — I69354 Hemiplegia and hemiparesis following cerebral infarction affecting left non-dominant side: Secondary | ICD-10-CM

## 2016-09-22 DIAGNOSIS — M6281 Muscle weakness (generalized): Secondary | ICD-10-CM

## 2016-09-22 NOTE — Therapy (Signed)
[facility] [address] [place], [place], [address] Phone: [phone]   Fax:  [phone]  Physical Therapy Treatment  Patient Details  Name: [name] MRN: [id number] Date of Birth: 06-14-1945 Referring Provider: Dr. Alysia Penna  Encounter Date: 09/22/2016      PT End of Session - 09/22/16 1639    Visit Number 20   Number of Visits 33  per recertification   Date for PT Re-Evaluation 63/84/66  per recertification   Authorization Type Medicare-G Codes and Progress Notes every 10th visit   PT Start Time 1405   PT Stop Time 1450   PT Time Calculation (min) 45 min   Activity Tolerance Patient tolerated treatment well   Behavior During Therapy [facility] for tasks assessed/performed      Past Medical History:  Diagnosis Date  . Anemia 07/29/2013  . Asthma    childhood asthma  . Benign prostatic hypertrophy    several prostate biopsies neg for cancer.   . Bilateral hip pain   . Cancer ([place])   . CHF (congestive heart failure) ([place])   . Complication of anesthesia    MORPHINE CAUSES HALLUCINATIONS  . CVA (cerebral vascular accident) ([place]) 06/2016  . Depression   . FH: colonic polyps 2010  . Gout   . History of kidney stones   . Hypertension   . Kidney cysts   . Morbid obesity ([place])   . Numbness    feet / legs  . PAF (paroxysmal atrial fibrillation) with RVR 07/29/2013  . Sleep apnea    USES C PAP    Past Surgical History:  Procedure Laterality Date  . ARTHROSCOPY KNEE W/ DRILLING    . BREATH TEK H PYLORI N/A 12/23/2012   Procedure: BREATH TEK H PYLORI;  Surgeon: Gayland Curry, MD;  Location: [facility];  Service: General;  Laterality: N/A;  . CHOLECYSTECTOMY  1989  . COLONOSCOPY N/A 07/31/2013   Procedure: COLONOSCOPY;  Surgeon: Gatha Mayer, MD;  Location: [facility];  Service: Endoscopy;  Laterality: N/A;  . ESOPHAGOGASTRODUODENOSCOPY N/A 07/22/2013   Procedure: ESOPHAGOGASTRODUODENOSCOPY  (EGD);  Surgeon: Irene Shipper, MD;  Location: [facility] ENDOSCOPY;  Service: Endoscopy;  Laterality: N/A;  . ESOPHAGOGASTRODUODENOSCOPY N/A 07/31/2013   Procedure: ESOPHAGOGASTRODUODENOSCOPY (EGD);  Surgeon: Gatha Mayer, MD;  Location: [facility];  Service: Endoscopy;  Laterality: N/A;  . INTRAOPERATIVE TRANSESOPHAGEAL ECHOCARDIOGRAM N/A 07/18/2013   Procedure: INTRAOPERATIVE TRANSESOPHAGEAL ECHOCARDIOGRAM;  Surgeon: Grace Isaac, MD;  Location: [facility];  Service: Open Heart Surgery;  Laterality: N/A;  . LAPAROSCOPIC GASTRIC BANDING WITH HIATAL HERNIA REPAIR N/A 04/08/2013   Procedure: LAPAROSCOPIC GASTRIC BANDING WITH possible  HIATAL HERNIA REPAIR;  Surgeon: Gayland Curry, MD;  Location: [facility];  Service: General;  Laterality: N/A;  . LITHOTRIPSY    . renal calculi    . SUBXYPHOID PERICARDIAL WINDOW N/A 07/18/2013   Procedure: SUBXYPHOID PERICARDIAL WINDOW;  Surgeon: Grace Isaac, MD;  Location: [facility];  Service: Thoracic;  Laterality: N/A;  . TONSILLECTOMY    . total knee replacment     bilat  . trigger finger repair     also lue, cts surgically repaired  . WISDOM TOOTH EXTRACTION      Vitals:   09/22/16 1408  BP: 120/80  Pulse: 70  SpO2: 98%        Subjective Assessment - 09/22/16 1415    Subjective Headache finally resolved.  Has not called cardiologist yet.  BP cuffs he has at  home do not take an accurate reading due to size of cuff; discussed purchasing wrist BP cuff.  O2 has remained in 90s.   Patient is accompained by: Family member   Pertinent History asthma, basal cell carcinoma, obesity, gout, depression, HTN, OSA, peripheral neuropathy, CHF, PAF, PE, bil TKA   Limitations Walking   Patient Stated Goals improve walking, balance, wants to drive again and be independent   Currently in Pain? No/denies            [facility] PT Assessment - 09/22/16 1419      Standardized Balance Assessment   Standardized Balance Assessment Berg Balance Test;10 meter walk test   10  Meter Walk 11.89 seconds with quad cane or 2.52ft/sec     Berg Balance Test   Sit to Stand Able to stand without using hands and stabilize independently   Standing Unsupported Able to stand safely 2 minutes   Sitting with Back Unsupported but Feet Supported on Floor or Stool Able to sit safely and securely 2 minutes   Stand to Sit Sits safely with minimal use of hands   Transfers Able to transfer safely, minor use of hands   Standing Unsupported with Eyes Closed Able to stand 10 seconds with supervision   Standing Ubsupported with Feet Together Able to place feet together independently and stand 1 minute safely   From Standing, Reach Forward with Outstretched Arm Can reach confidently >25 cm (10")   From Standing Position, Pick up Object from Floor Able to pick up shoe, needs supervision   From Standing Position, Turn to Look Behind Over each Shoulder Needs supervision when turning   Turn 360 Degrees Able to turn 360 degrees safely but slowly   Standing Unsupported, Alternately Place Feet on Step/Stool Able to stand independently and safely and complete 8 steps in 20 seconds   Standing Unsupported, One Foot in Front Able to take small step independently and hold 30 seconds   Standing on One Leg Tries to lift leg/unable to hold 3 seconds but remains standing independently   Total Score 44   Berg comment: 44/56                          Balance Exercises - 09/22/16 1440      Balance Exercises: Standing   Rockerboard Lateral;Head turns;EO  head turns, diagonals with trunk rotation/reaching           PT Education - 09/22/16 1639    Education provided Yes   Education Details progress made with balance and gait   Person(s) Educated Patient;Spouse   Methods Explanation   Comprehension Verbalized understanding          PT Short Term Goals - 08/08/16 1452      PT SHORT TERM GOAL #1   Title verbalize understanding of CVA risk factors/warning signs (08/10/16)    Status Achieved     PT SHORT TERM GOAL #2   Title improve timed up and go to < 30 sec for improved mobility (08/10/16)   Status Achieved     PT SHORT TERM GOAL #3   Title improve gait velocity to > 1.32 ft/sec for improved mobility and community access (08/10/16)   Status Achieved     PT SHORT TERM GOAL #4   Title amb > 300' on level indoor/paved outdoor surfaces with LRAD with supervision for improved community access (08/10/16)   Status Achieved     PT SHORT TERM GOAL #5  Title perform BERG with appropriate LTG to be written (08/10/16)   Status Achieved           PT Long Term Goals - 2016-10-17 1640      PT LONG TERM GOAL #1   Title independent with HEP (NEW TARGET DATE OF 11/03/2016)   Time 8   Period Weeks   Status On-going     PT LONG TERM GOAL #4   Title amb > 500' on various indoor/outdoor surfaces and will negotiate up/down 7 stairs with cane and rail with supervision for improved independence and mobility (11/03/16)   Baseline 500' on outdoor pavement (uneven) with quad cane and min A; rollator with supervision   Time 8   Period Weeks   Status Revised     PT LONG TERM GOAL #5   Title Pt will decrease falls risk as indicated by an increase in BERG balance score to > or = 45/56 (11/03/16)   Baseline 42/56 on 2016/10/17   Time 8   Period Weeks   Status Revised     PT LONG TERM GOAL #6   Title Pt will improve gait velocity with quad cane to > or = 2.67ft/sec for decreased falls risk during household and limited community ambulation with LRAD (11/03/16)   Baseline 2.75 ft/sec with quad cane on Oct 18, 2022   Time 8   Period Weeks   Status Revised               Plan - 10-17-2016 1659    Clinical Impression Statement Treatment today focused on re-assessment of balance, falls risk and assessment of safety with gait with cane.  Pt continues to show improvement in balance as indicated by improvement in BERG score to 44/56; pt still at significant risk for falls.  Pt gait speed with  cane has improved to 2.32ft/sec.  Focused balance training on maintaining balance during head turns and trunk rotations with reaching high and low.  Pt with improved tolerance of therapy session today; 02 remained >94% throughout session despite multiple rest breaks due to DOE.  Will continue to progress and work towards LTG.   Rehab Potential Good   Clinical Impairments Affecting Rehab Potential multiple comorbidities   PT Frequency 2x / week   PT Duration 8 weeks   PT Treatment/Interventions ADLs/Self Care Home Management;Cryotherapy;Electrical Stimulation;Neuromuscular re-education;Balance training;Therapeutic exercise;Therapeutic activities;Functional mobility training;Gait training;Stair training;Patient/family education;Orthotic Fit/Training;Manual techniques;Vestibular;Energy conservation;Moist Heat;DME Instruction   PT Next Visit Plan gait with cane, outside surfaces, including with head turns/movements, balance- emphasis on single leg stance and complaint surfaces, SLS, partial tandem stance   Consulted and Agree with Plan of Care Patient;Family member/caregiver   Family Member Consulted wife      Patient will benefit from skilled therapeutic intervention in order to improve the following deficits and impairments:  Abnormal gait, Decreased balance, Decreased activity tolerance, Decreased strength, Postural dysfunction, Obesity, Decreased endurance, Decreased knowledge of use of DME, Decreased mobility, Difficulty walking, Dizziness, Decreased coordination, Impaired sensation, Impaired vision/preception  Visit Diagnosis: Hemiplegia and hemiparesis following cerebral infarction affecting left non-dominant side (HCC)  Other abnormalities of gait and mobility  Muscle weakness (generalized)  Unsteadiness on feet       G-Codes - 2016-10-17 1702    Functional Assessment Tool Used (Outpatient Only) BERG 44/56; gait speed with cane 2.51ft/sec   Functional Limitation Mobility: Walking and  moving around   Mobility: Walking and Moving Around Current Status ([id number]) At least 20 percent but less than 40 percent impaired, limited or restricted  Mobility: Walking and Moving Around Goal Status [phone]) At least 20 percent but less than 40 percent impaired, limited or restricted     Physical Therapy Progress Note  Dates of Reporting Period: 08/14/16 to 09/22/16  Objective Reports of Subjective Statement: See above  Objective Measurements: BERG and gait speed with cane  Goal Update: See LTG above  Plan: Continue POC  Reason Skilled Services are Required: to continue to address impaired balance, postural control, gait, endurance and decrease falls risk.    Problem List Patient Active Problem List   Diagnosis Date Noted  . Benign essential HTN   . 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 Embolic cerebral infarction ([place]) 06/21/2016  . Stroke syndrome ([place])   . OSA (obstructive sleep apnea)   . Dysphagia, post-stroke   . Ataxia, post-stroke   . Neurologic gait disorder   . Encounter for monitoring flecainide therapy 04/28/2016  . Pure hyperglyceridemia 04/26/2016  . Recurrent pulmonary emboli ([place]) 06/13/2015  . GERD (gastroesophageal reflux disease) 06/13/2015  . Asthma 06/13/2015  . Basal cell carcinoma of skin 06/28/2014  . 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 Sinus bradycardia 12/15/2013  . PAF (paroxysmal atrial fibrillation) ([place]) 07/29/2013  . Chronic anticoagulation 07/22/2013  . Gastric ulcer with hemorrhage 07/20/13 07/22/2013  . Diastolic congestive heart failure ([place]) 07/16/2013  . H/O laparoscopic adjustable gastric banding & hiatal hernia repair 04/08/13 04/23/2013  . Prediabetes 02/05/2013  . TMJ (temporomandibular joint syndrome) 09/11/2012  . Morbid obesity ([place]) 08/09/2012  . Peripheral neuropathy ([place]) 11/02/2009  . Hyperlipidemia with target LDL less than 130 09/10/2008  . Depression 09/10/2008  . GOUT 06/28/2006  . BPH (benign prostatic  hyperplasia) 06/28/2006    Raylene Everts, PT, DPT 09/22/16    5:04 PM    [facility] [address] [place], [place], [address] Phone: [phone]   Fax:  [phone]  Name: [name] MRN: [id number] Date of Birth: 06-01-1945

## 2016-09-26 ENCOUNTER — Ambulatory Visit: Payer: Medicare Other | Admitting: Physical Therapy

## 2016-09-26 ENCOUNTER — Telehealth: Payer: Self-pay | Admitting: Internal Medicine

## 2016-09-26 VITALS — HR 94

## 2016-09-26 DIAGNOSIS — R2681 Unsteadiness on feet: Secondary | ICD-10-CM

## 2016-09-26 DIAGNOSIS — M6281 Muscle weakness (generalized): Secondary | ICD-10-CM

## 2016-09-26 DIAGNOSIS — R2689 Other abnormalities of gait and mobility: Secondary | ICD-10-CM

## 2016-09-26 DIAGNOSIS — I69354 Hemiplegia and hemiparesis following cerebral infarction affecting left non-dominant side: Secondary | ICD-10-CM | POA: Diagnosis not present

## 2016-09-26 NOTE — Telephone Encounter (Signed)
Spoke with pt, aware to keep scheduled appoointment.

## 2016-09-26 NOTE — Telephone Encounter (Signed)
Thanks - Dr H 

## 2016-09-26 NOTE — Telephone Encounter (Signed)
Spoke with pt, he was told to call dr hilty by PT because his oxygen dropped to 90% when exercising on one occasion and he has been breathing hard during PT.  He reports continued SOB with little exertion. He was given an appointment with APP next week but wanted to make dr hilty aware.

## 2016-09-26 NOTE — Telephone Encounter (Signed)
New message    Pt c/o Shortness Of Breath: STAT if SOB developed within the last 24 hours or pt is noticeably SOB on the phone  1. Are you currently SOB (can you hear that pt is SOB on the phone)? Not right this minute.  2. How long have you been experiencing SOB? Off and on for a long time. Since stroke during therapy he gets more SOB  3. Are you SOB when sitting or when up moving around? Up moving around  4. Are you currently experiencing any other symptoms? Not really

## 2016-09-26 NOTE — Therapy (Signed)
[facility] [address] [place], [place], [address] Phone: [phone]   Fax:  [phone]  Physical Therapy Treatment  Patient Details  Name: [name] MRN: [id number] Date of Birth: 1945-11-03 Referring Provider: Dr. Alysia Penna  Encounter Date: 09/26/2016      PT End of Session - 09/26/16 1314    Visit Number 21   Number of Visits 34   Date for PT Re-Evaluation 11/03/16   Authorization Type Medicare-G Codes and Progress Notes every 10th visit   PT Start Time 1233   PT Stop Time 1312   PT Time Calculation (min) 39 min   Equipment Utilized During Treatment Gait belt   Activity Tolerance Patient tolerated treatment well   Behavior During Therapy [facility] for tasks assessed/performed      Past Medical History:  Diagnosis Date  . Anemia 07/29/2013  . Asthma    childhood asthma  . Benign prostatic hypertrophy    several prostate biopsies neg for cancer.   . Bilateral hip pain   . Cancer ([place])   . CHF (congestive heart failure) ([place])   . Complication of anesthesia    MORPHINE CAUSES HALLUCINATIONS  . CVA (cerebral vascular accident) ([place]) 06/2016  . Depression   . FH: colonic polyps 2010  . Gout   . History of kidney stones   . Hypertension   . Kidney cysts   . Morbid obesity ([place])   . Numbness    feet / legs  . PAF (paroxysmal atrial fibrillation) with RVR 07/29/2013  . Sleep apnea    USES C PAP    Past Surgical History:  Procedure Laterality Date  . ARTHROSCOPY KNEE W/ DRILLING    . BREATH TEK H PYLORI N/A 12/23/2012   Procedure: BREATH TEK H PYLORI;  Surgeon: Gayland Curry, MD;  Location: [facility];  Service: General;  Laterality: N/A;  . CHOLECYSTECTOMY  1989  . COLONOSCOPY N/A 07/31/2013   Procedure: COLONOSCOPY;  Surgeon: Gatha Mayer, MD;  Location: [facility];  Service: Endoscopy;  Laterality: N/A;  . ESOPHAGOGASTRODUODENOSCOPY N/A 07/22/2013   Procedure:  ESOPHAGOGASTRODUODENOSCOPY (EGD);  Surgeon: Irene Shipper, MD;  Location[facility];  Service: Endoscopy;  Laterality: N/A;  . ESOPHAGOGASTRODUODENOSCOPY N/A 07/31/2013   Procedure: ESOPHAGOGASTRODUODENOSCOPY (EGD);  Surgeon: Gatha Mayer, MD;  Location: [facility];  Service: Endoscopy;  Laterality: N/A;  . INTRAOPERATIVE TRANSESOPHAGEAL ECHOCARDIOGRAM N/A 07/18/2013   Procedure: INTRAOPERATIVE TRANSESOPHAGEAL ECHOCARDIOGRAM;  Surgeon: Grace Isaac, MD;  Location: [facility];  Service: Open Heart Surgery;  Laterality: N/A;  . LAPAROSCOPIC GASTRIC BANDING WITH HIATAL HERNIA REPAIR N/A 04/08/2013   Procedure: LAPAROSCOPIC GASTRIC BANDING WITH possible  HIATAL HERNIA REPAIR;  Surgeon: Gayland Curry, MD;  Location: [facility];  Service: General;  Laterality: N/A;  . LITHOTRIPSY    . renal calculi    . SUBXYPHOID PERICARDIAL WINDOW N/A 07/18/2013   Procedure: SUBXYPHOID PERICARDIAL WINDOW;  Surgeon: Grace Isaac, MD;  Location: [facility];  Service: Thoracic;  Laterality: N/A;  . TONSILLECTOMY    . total knee replacment     bilat  . trigger finger repair     also lue, cts surgically repaired  . WISDOM TOOTH EXTRACTION      Vitals:   09/26/16 1236 09/26/16 1310  Pulse: 80 94  SpO2: 98% 96%        Subjective Assessment - 09/26/16 1236    Subjective Patient reported no falls since last visit, but reported 2 "close calls."  One after stooping to pick up package up off ground. He said he felt "swimmy headed" when standing back up. The other when reaching for his son and misjudged distance. He said he had to do everything he could to keep from falling. Patient sees Dr. Debara Pickett next Tuesday to f/u on SOB.   Pertinent History asthma, basal cell carcinoma, obesity, gout, depression, HTN, OSA, peripheral neuropathy, CHF, PAF, PE, bil TKA   Currently in Pain? No/denies   Pain Score 0-No pain           [facility] Adult PT Treatment/Exercise - 09/26/16 1247      Ambulation/Gait   Ambulation/Gait Yes    Ambulation/Gait Assistance 5: Supervision;4: Min guard   Ambulation/Gait Assistance Details cues on posture, cane placement and step length with gait.    Ambulation Distance (Feet) 450 Feet  x2   Assistive device Small based quad cane   Gait Pattern Step-through pattern;Wide base of support;Decreased stance time - right;Decreased step length - left;Decreased step length - right;Decreased stance time - left;Decreased stride length;Decreased weight shift to right;Decreased weight shift to left;Decreased trunk rotation  Left step length shorter than right   Ambulation Surface Level;Indoor            Balance Exercises - 09/26/16 1258      Balance Exercises: Standing   Standing Eyes Opened Foam/compliant surface;Head turns;Limitations  head turns x10 R/L, up/down x10 (LOB x2)   Standing Eyes Closed Foam/compliant surface;30 secs;3 reps;Head turns;Limitations  static; head turns R/L x10; up/down x10    Rockerboard Anterior/posterior;Intermittent UE support;Head turns;EO;10 reps  (lateral diagonals with trunk rotation/reaching)     Balance Exercises: Standing   Standing Eyes Opened Limitations on airex in parallel bars with wide stance: EO for alternating UE raises, bil simulataneous UE raises and angel wings x 10 reps each; progressed to EO for head movements left<>right, up<>down and diagonals both ways x 10 reps each with min to mod assist for balance. cues on posture and weight shifting to assist balance.   Standing Eyes Closed Limitations on airex with feet together: EC no head movements, progressing to EC head movements left<>right and up<>down with min assist. cues on posture and weight shifting to assist with balance           PT Short Term Goals - 08/08/16 1452      PT SHORT TERM GOAL #1   Title verbalize understanding of CVA risk factors/warning signs (08/10/16)   Status Achieved     PT SHORT TERM GOAL #2   Title improve timed up and go to < 30 sec for improved mobility  (08/10/16)   Status Achieved     PT SHORT TERM GOAL #3   Title improve gait velocity to > 1.32 ft/sec for improved mobility and community access (08/10/16)   Status Achieved     PT SHORT TERM GOAL #4   Title amb > 300' on level indoor/paved outdoor surfaces with LRAD with supervision for improved community access (08/10/16)   Status Achieved     PT SHORT TERM GOAL #5   Title perform BERG with appropriate LTG to be written (08/10/16)   Status Achieved           PT Long Term Goals - 09/22/16 1640      PT LONG TERM GOAL #1   Title independent with HEP (NEW TARGET DATE OF 11/03/2016)   Time 8   Period Weeks   Status On-going     PT LONG TERM GOAL #  4   Title amb > 500' on various indoor/outdoor surfaces and will negotiate up/down 7 stairs with cane and rail with supervision for improved independence and mobility (11/03/16)   Baseline 500' on outdoor pavement (uneven) with quad cane and min A; rollator with supervision   Time 8   Period Weeks   Status Revised     PT LONG TERM GOAL #5   Title Pt will decrease falls risk as indicated by an increase in BERG balance score to > or = 45/56 (11/03/16)   Baseline 42/56 on 09/22/16   Time 8   Period Weeks   Status Revised     PT LONG TERM GOAL #6   Title Pt will improve gait velocity with quad cane to > or = 2.45ft/sec for decreased falls risk during household and limited community ambulation with LRAD (11/03/16)   Baseline 2.75 ft/sec with quad cane on 6/22   Time 8   Period Weeks   Status Revised            Plan - 09/26/16 1319    Clinical Impression Statement Today's skilled session focused on gait and balance. Patient is progressing well on LTG 4 addressing walking distance. He covered 450 feet with VS WFL following both gait trials. Patient demonstrated improved endurance and balance, completing greater variety of balance exercises with improved control. He is progressing towards goals and should benefit from continued PT to progress  towards unmet goals.   Rehab Potential Good   Clinical Impairments Affecting Rehab Potential multiple comorbidities   PT Frequency 2x / week   PT Duration 8 weeks   PT Treatment/Interventions ADLs/Self Care Home Management;Cryotherapy;Electrical Stimulation;Neuromuscular re-education;Balance training;Therapeutic exercise;Therapeutic activities;Functional mobility training;Gait training;Stair training;Patient/family education;Orthotic Fit/Training;Manual techniques;Vestibular;Energy conservation;Moist Heat;DME Instruction   PT Next Visit Plan Continue emphasis on gait and balance, compliant and outdoor surfaces, including head turns.   Consulted and Agree with Plan of Care Patient      Patient will benefit from skilled therapeutic intervention in order to improve the following deficits and impairments:  Abnormal gait, Decreased balance, Decreased activity tolerance, Decreased strength, Postural dysfunction, Obesity, Decreased endurance, Decreased knowledge of use of DME, Decreased mobility, Difficulty walking, Dizziness, Decreased coordination, Impaired sensation, Impaired vision/preception  Visit Diagnosis: Hemiplegia and hemiparesis following cerebral infarction affecting left non-dominant side (HCC)  Other abnormalities of gait and mobility  Muscle weakness (generalized)  Unsteadiness on feet     Problem List Patient Active Problem List   Diagnosis Date Noted  . Benign essential HTN   . 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 Embolic cerebral infarction ([place]) 06/21/2016  . Stroke syndrome ([place])   . OSA (obstructive sleep apnea)   . Dysphagia, post-stroke   . Ataxia, post-stroke   . Neurologic gait disorder   . Encounter for monitoring flecainide therapy 04/28/2016  . Pure hyperglyceridemia 04/26/2016  . Recurrent pulmonary emboli ([place]) 06/13/2015  . GERD (gastroesophageal reflux disease) 06/13/2015  . Asthma 06/13/2015  . Basal cell carcinoma of skin 06/28/2014  . Periodic limb  movement sleep disorder 06/19/2014  . Sinus bradycardia 12/15/2013  . PAF (paroxysmal atrial fibrillation) ([place]) 07/29/2013  . Chronic anticoagulation 07/22/2013  . Gastric ulcer with hemorrhage 07/20/13 07/22/2013  . Diastolic congestive heart failure ([place]) 07/16/2013  . H/O laparoscopic adjustable gastric banding & hiatal hernia repair 04/08/13 04/23/2013  . Prediabetes 02/05/2013  . TMJ (temporomandibular joint syndrome) 09/11/2012  . Morbid obesity ([place]) 08/09/2012  . Peripheral neuropathy ([place]) 11/02/2009  . Hyperlipidemia with target LDL less than 130  09/10/2008  . Depression 09/10/2008  . GOUT 06/28/2006  . BPH (benign prostatic hyperplasia) 06/28/2006    [name] 09/26/2016, 2:08 PM  [facility] [address] [place], [place], [address] Phone: [phone]   Fax:  [phone]  Name: [name] MRN: [id number] Date of Birth: Jan 20, 1946  This note has been reviewed and edited by supervising CI.  Willow Ora, PTA, [facility] [address] [place], [place] [address] [phone] 09/26/16, 3:26 PM

## 2016-09-29 ENCOUNTER — Encounter: Payer: Self-pay | Admitting: Physical Therapy

## 2016-09-29 ENCOUNTER — Ambulatory Visit: Payer: Medicare Other | Admitting: Physical Therapy

## 2016-09-29 DIAGNOSIS — M6281 Muscle weakness (generalized): Secondary | ICD-10-CM

## 2016-09-29 DIAGNOSIS — R2689 Other abnormalities of gait and mobility: Secondary | ICD-10-CM | POA: Diagnosis not present

## 2016-09-29 DIAGNOSIS — I69354 Hemiplegia and hemiparesis following cerebral infarction affecting left non-dominant side: Secondary | ICD-10-CM | POA: Diagnosis not present

## 2016-09-29 DIAGNOSIS — R2681 Unsteadiness on feet: Secondary | ICD-10-CM | POA: Diagnosis not present

## 2016-09-29 NOTE — Therapy (Signed)
[facility] [address] [place], [place], [address] Phone: [phone]   Fax:  [phone]  Physical Therapy Treatment  Patient Details  Name: [name] MRN: [id number] Date of Birth: [birth year] Referring Provider: Dr. Alysia Penna  Encounter Date: 09/29/2016      PT End of Session - 09/29/16 1240    Visit Number 22   Number of Visits 34   Date for PT Re-Evaluation 11/03/16   Authorization Type Medicare-G Codes and Progress Notes every 10th visit   PT Start Time 1145   PT Stop Time 1230   PT Time Calculation (min) 45 min   Equipment Utilized During Treatment Gait belt   Activity Tolerance Patient tolerated treatment well   Behavior During Therapy [facility] for tasks assessed/performed      Past Medical History:  Diagnosis Date  . Anemia 07/29/2013  . Asthma    childhood asthma  . Benign prostatic hypertrophy    several prostate biopsies neg for cancer.   . Bilateral hip pain   . Cancer ([place])   . CHF (congestive heart failure) ([place])   . Complication of anesthesia    MORPHINE CAUSES HALLUCINATIONS  . CVA (cerebral vascular accident) ([place]) 06/2016  . Depression   . FH: colonic polyps 2010  . Gout   . History of kidney stones   . Hypertension   . Kidney cysts   . Morbid obesity ([place])   . Numbness    feet / legs  . PAF (paroxysmal atrial fibrillation) with RVR 07/29/2013  . Sleep apnea    USES C PAP    Past Surgical History:  Procedure Laterality Date  . ARTHROSCOPY KNEE W/ DRILLING    . BREATH TEK H PYLORI N/A 12/23/2012   Procedure: BREATH TEK H PYLORI;  Surgeon: Gayland Curry, MD;  Location: [facility];  Service: General;  Laterality: N/A;  . CHOLECYSTECTOMY  1989  . COLONOSCOPY N/A 07/31/2013   Procedure: COLONOSCOPY;  Surgeon: Gatha Mayer, MD;  Location: [facility];  Service: Endoscopy;  Laterality: N/A;  . ESOPHAGOGASTRODUODENOSCOPY N/A 07/22/2013   Procedure:  ESOPHAGOGASTRODUODENOSCOPY (EGD);  Surgeon: Irene Shipper, MD;  Location: [facility] ENDOSCOPY;  Service: Endoscopy;  Laterality: N/A;  . ESOPHAGOGASTRODUODENOSCOPY N/A 07/31/2013   Procedure: ESOPHAGOGASTRODUODENOSCOPY (EGD);  Surgeon: Gatha Mayer, MD;  Location: [facility] ENDOSCOPY;  Service: Endoscopy;  Laterality: N/A;  . INTRAOPERATIVE TRANSESOPHAGEAL ECHOCARDIOGRAM N/A 07/18/2013   Procedure: INTRAOPERATIVE TRANSESOPHAGEAL ECHOCARDIOGRAM;  Surgeon: Grace Isaac, MD;  Location: [facility];  Service: Open Heart Surgery;  Laterality: N/A;  . LAPAROSCOPIC GASTRIC BANDING WITH HIATAL HERNIA REPAIR N/A 04/08/2013   Procedure: LAPAROSCOPIC GASTRIC BANDING WITH possible  HIATAL HERNIA REPAIR;  Surgeon: Gayland Curry, MD;  Location: [facility];  Service: General;  Laterality: N/A;  . LITHOTRIPSY    . renal calculi    . SUBXYPHOID PERICARDIAL WINDOW N/A 07/18/2013   Procedure: SUBXYPHOID PERICARDIAL WINDOW;  Surgeon: Grace Isaac, MD;  Location: [facility];  Service: Thoracic;  Laterality: N/A;  . TONSILLECTOMY    . total knee replacment     bilat  . trigger finger repair     also lue, cts surgically repaired  . WISDOM TOOTH EXTRACTION      There were no vitals filed for this visit.      Subjective Assessment - 09/29/16 1236    Subjective Pt has appt with cardiology on Tuesday; cardiologist is requesting chart notes documenting changes in vitals with exertion.  Pt reports  today is an "in between day".  Feels a little lightheaded, slight visual changes, more off balance.   Patient is accompained by: Family member   Pertinent History asthma, basal cell carcinoma, obesity, gout, depression, HTN, OSA, peripheral neuropathy, CHF, PAF, PE, bil TKA   Limitations Walking   Patient Stated Goals improve walking, balance, wants to drive again and be independent   Currently in Pain? No/denies            [facility] PT Assessment - 09/29/16 1154      6 Minute Walk- Baseline   6 Minute Walk- Baseline yes   BP (mmHg)  118/70  right arm   HR (bpm) 72   02 Sat (%RA) 98 %   Modified Borg Scale for Dyspnea 0- Nothing at all   Perceived Rate of Exertion (Borg) 6-     6 Minute walk- Post Test   6 Minute Walk Post Test yes   BP (mmHg) 150/80   HR (bpm) 107   02 Sat (%RA) 94 %   Modified Borg Scale for Dyspnea 5- Strong or hard breathing   Perceived Rate of Exertion (Borg) 13- Somewhat hard     6 minute walk test results    Aerobic Endurance Distance Walked 1030   Endurance additional comments After a 4 minutes-reassessed vitals: 140/75, HR: 75 bpm, 98% Sp02           Balance Exercises - 09/29/16 1218      Balance Exercises: Standing   Rockerboard Lateral;Head turns;EO;Intermittent UE support  head turns; wt shift; wt shift with head turn           PT Education - 09/29/16 1240    Education provided Yes   Education Details changes in vitals with 6 min walk test   Person(s) Educated Patient;Spouse   Methods Explanation   Comprehension Verbalized understanding          PT Short Term Goals - 08/08/16 1452      PT SHORT TERM GOAL #1   Title verbalize understanding of CVA risk factors/warning signs (08/10/16)   Status Achieved     PT SHORT TERM GOAL #2   Title improve timed up and go to < 30 sec for improved mobility (08/10/16)   Status Achieved     PT SHORT TERM GOAL #3   Title improve gait velocity to > 1.32 ft/sec for improved mobility and community access (08/10/16)   Status Achieved     PT SHORT TERM GOAL #4   Title amb > 300' on level indoor/paved outdoor surfaces with LRAD with supervision for improved community access (08/10/16)   Status Achieved     PT SHORT TERM GOAL #5   Title perform BERG with appropriate LTG to be written (08/10/16)   Status Achieved           PT Long Term Goals - 09/22/16 1640      PT LONG TERM GOAL #1   Title independent with HEP (NEW TARGET DATE OF 11/03/2016)   Time 8   Period Weeks   Status On-going     PT LONG TERM GOAL #4   Title amb  > 500' on various indoor/outdoor surfaces and will negotiate up/down 7 stairs with cane and rail with supervision for improved independence and mobility (11/03/16)   Baseline 500' on outdoor pavement (uneven) with quad cane and min A; rollator with supervision   Time 8   Period Weeks   Status Revised     PT LONG  TERM GOAL #5   Title Pt will decrease falls risk as indicated by an increase in BERG balance score to > or = 45/56 (11/03/16)   Baseline 42/56 on 09/22/16   Time 8   Period Weeks   Status Revised     PT LONG TERM GOAL #6   Title Pt will improve gait velocity with quad cane to > or = 2.91ft/sec for decreased falls risk during household and limited community ambulation with LRAD (11/03/16)   Baseline 2.75 ft/sec with quad cane on 6/22   Time 8   Period Weeks   Status Revised               Plan - 09/29/16 1245    Clinical Impression Statement Treatment session today focused on assessment of cardiorespiratory system during endurance activity-6 min walk test.  See flow sheet for details.  Pt did demonstrate DOE but Sp02 remained >90% and recovered after 2-3 minutes.  BP did increase from 413 systolic to 244 systolic with exertion and only recovered to 010 systolic after 3-4 minute of seated rest.  Continued balance testing on rockerboard to continue to focus on weight shifting and reorientation to midline.     Rehab Potential Good   Clinical Impairments Affecting Rehab Potential multiple comorbidities   PT Frequency 2x / week   PT Duration 8 weeks   PT Treatment/Interventions ADLs/Self Care Home Management;Cryotherapy;Electrical Stimulation;Neuromuscular re-education;Balance training;Therapeutic exercise;Therapeutic activities;Functional mobility training;Gait training;Stair training;Patient/family education;Orthotic Fit/Training;Manual techniques;Vestibular;Energy conservation;Moist Heat;DME Instruction   PT Next Visit Plan Continue emphasis on gait with quad cane, weight shifting/  balance, compliant and outdoor surfaces, including head turns, endurance   Consulted and Agree with Plan of Care Patient      Patient will benefit from skilled therapeutic intervention in order to improve the following deficits and impairments:  Abnormal gait, Decreased balance, Decreased activity tolerance, Decreased strength, Postural dysfunction, Obesity, Decreased endurance, Decreased knowledge of use of DME, Decreased mobility, Difficulty walking, Dizziness, Decreased coordination, Impaired sensation, Impaired vision/preception  Visit Diagnosis: Hemiplegia and hemiparesis following cerebral infarction affecting left non-dominant side (HCC)  Other abnormalities of gait and mobility  Muscle weakness (generalized)  Unsteadiness on feet     Problem List Patient Active Problem List   Diagnosis Date Noted  . Benign essential HTN   . 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 Embolic cerebral infarction ([place]) 06/21/2016  . Stroke syndrome ([place])   . OSA (obstructive sleep apnea)   . Dysphagia, post-stroke   . Ataxia, post-stroke   . Neurologic gait disorder   . Encounter for monitoring flecainide therapy 04/28/2016  . Pure hyperglyceridemia 04/26/2016  . Recurrent pulmonary emboli ([place]) 06/13/2015  . GERD (gastroesophageal reflux disease) 06/13/2015  . Asthma 06/13/2015  . Basal cell carcinoma of skin 06/28/2014  . 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 Sinus bradycardia 12/15/2013  . PAF (paroxysmal atrial fibrillation) ([place]) 07/29/2013  . Chronic anticoagulation 07/22/2013  . Gastric ulcer with hemorrhage 07/20/13 07/22/2013  . Diastolic congestive heart failure ([place]) 07/16/2013  . H/O laparoscopic adjustable gastric banding & hiatal hernia repair 04/08/13 04/23/2013  . Prediabetes 02/05/2013  . TMJ (temporomandibular joint syndrome) 09/11/2012  . Morbid obesity ([place]) 08/09/2012  . Peripheral neuropathy ([place]) 11/02/2009  . Hyperlipidemia with target LDL less than 130  09/10/2008  . Depression 09/10/2008  . GOUT 06/28/2006  . BPH (benign prostatic hyperplasia) 06/28/2006    Raylene Everts, PT, DPT 09/29/16    12:57 PM    [name]  [address] [place], [place], [address] Phone: [phone]   Fax:  [phone]  Name: [name] MRN: [id number] Date of Birth: [birth year]

## 2016-09-30 ENCOUNTER — Other Ambulatory Visit: Payer: Self-pay | Admitting: Internal Medicine

## 2016-10-02 ENCOUNTER — Encounter: Payer: Self-pay | Admitting: Neurology

## 2016-10-02 ENCOUNTER — Telehealth: Payer: Self-pay | Admitting: Internal Medicine

## 2016-10-02 ENCOUNTER — Ambulatory Visit (INDEPENDENT_AMBULATORY_CARE_PROVIDER_SITE_OTHER): Payer: Medicare Other | Admitting: Neurology

## 2016-10-02 VITALS — BP 128/74 | HR 67 | Ht 72.0 in | Wt 357.0 lb

## 2016-10-02 DIAGNOSIS — I63112 Cerebral infarction due to embolism of left vertebral artery: Secondary | ICD-10-CM

## 2016-10-02 DIAGNOSIS — G463 Brain stem stroke syndrome: Secondary | ICD-10-CM | POA: Diagnosis not present

## 2016-10-02 NOTE — Progress Notes (Signed)
[facility] [address] [place]. [place] [address] [phone]       OFFICE FOLLOW-UP NOTE  [name] Date of Birth:  [birth year] Medical Record Number:  [id number]   HPI: [name] is a 71 year old Caucasian male seen today for first office follow-up visit for in-hospital admission in March 2018 for stroke. He is accompanied by his wife. History is obtained from the patient, wife, review of electronic medical records and I have personally viewed imaging for lemons and lab results [name] is a 71 y.o. male who was in his normal state of health until around 1:30 AM on 06/19/16.[name] He was in bed watching a movie when he had sudden onset left-sided head pain. He states that he then tried to stand up and found that he was leaning to the left. 911 was called and he was brought in as a code stroke. 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 There was a remote age pontine microhemorrhages noted. Transthoracic echo showed normal ejection fraction. LDL cholesterol was borderline at 77 (specimen C was 5.8. Patient was continued on his home aspirin 81 eliquis as he stated he had a bad experience on Xarelto did not want to switch to Pradaxa after discussing risks and benefits. Patient has been getting physical and occupational therapy  and noticing improvement. Diplopia has resolved. His voice is soft and hoarse yet. Still has some intermittent tingling numbnessc in his extremities. His balance is off. But getting better. Gets dizzy and off balance particularly when he moves quickly. He mostly uses a wheeled walker and has had no falls. He can walk short distances without a walker. ROS:   14 system  review of systems is positive for fatigue, hearing loss, trouble swallowing, double vision, blurred vision, shortness of breath, choking, leg swelling, heat intolerance, restless legs, acting out dreams, walking difficulty, back pain, numbness, depression and all other systems negative  PMH:  Past Medical History:  Diagnosis Date  . Anemia 07/29/2013  . Asthma    childhood asthma  . Benign prostatic hypertrophy    several prostate biopsies neg for cancer.   . Bilateral hip pain   . Cancer ([place])   . CHF (congestive heart failure) ([place])   . Complication of anesthesia    MORPHINE CAUSES HALLUCINATIONS  . CVA (cerebral vascular accident) ([place]) 06/2016  . Depression   . FH: colonic polyps 2010  . Gout   . History of kidney stones   . Hypertension   . Kidney cysts   . Morbid obesity ([place])   . Numbness    feet / legs  . PAF (paroxysmal atrial fibrillation) with RVR 07/29/2013  . Sleep apnea    USES C PAP    Social History:  Social History   Social History  . Marital status: Married    Spouse name: N/A  . Number of children: 1  . Years of education: N/A   Occupational History  . retired-Manager [employer]    Social History Main Topics  . Smoking status: Former Smoker    Packs/day: 0.20    Years: 13.00    Types: Cigarettes    Start date: 04/03/1961    Quit  date: 08/30/1974  . Smokeless tobacco: Never Used  . Alcohol use No     Comment: Occasionally; 1 small drink a week  . Drug use: No  . Sexual activity: Not Currently   Other Topics Concern  . Not on file   Social History Narrative   Lives at home w/ his wife   Right-handed   Caffeine: decaf coffee, occasional tea    Medications:   Current Outpatient Prescriptions on File Prior to Visit  Medication Sig Dispense Refill  . apixaban (ELIQUIS) 5 MG TABS tablet Take 1 tablet (5 mg total) by mouth 2 (two) times daily. 180 tablet 3  . aspirin 81 MG tablet Take 81 mg by mouth 2 (two) times daily.     [name] atorvastatin  (LIPITOR) 80 MG tablet Take 1 tablet (80 mg total) by mouth daily. 90 tablet 1  . budesonide-formoterol (SYMBICORT) 160-4.5 MCG/ACT inhaler Inhale 2 puffs into the lungs 2 (two) times daily as needed (shortness of breath).    . CALCIUM PO Take 500 mg by mouth 3 (three) times daily.     . Cyanocobalamin (VITAMIN B-12) 1000 MCG SUBL Place 1,000 mcg under the tongue every evening.     [name] Sodium (DSS) 100 MG CAPS Take 100 mg by mouth every other day.    . finasteride (PROSCAR) 5 MG tablet Take 1 tablet (5 mg total) by mouth daily. 30 tablet 1  . flecainide (TAMBOCOR) 50 MG tablet Take 1 tablet (50 mg total) by mouth every 12 (twelve) hours. 180 tablet 2  . furosemide (LASIX) 40 MG tablet TAKE 1 TABLET BY MOUTH EVERY DAY 90 tablet 1  . KLOR-CON M20 20 MEQ tablet TAKE 2 TABLETS BY MOUTH EVERY DAY 60 tablet 3  . loratadine (CLARITIN) 10 MG tablet Take 10 mg by mouth daily.    [name] LORazepam (ATIVAN) 0.5 MG tablet Take 1 tablet (0.5 mg total) by mouth at bedtime. 30 tablet 2  . montelukast (SINGULAIR) 10 MG tablet TAKE 1 TABLET BY MOUTH ONCE A DAY AT BEDTIME 30 tablet 1  . Multiple Vitamins-Iron (MULTIVITAMINS WITH IRON) TABS tablet Take 1 tablet by mouth daily.    . pantoprazole (PROTONIX) 40 MG tablet TAKE 1 TABLET BY MOUTH EVERY DAY 90 tablet 1  . rOPINIRole (REQUIP) 0.5 MG tablet TAKE 1 TABLET AT BEDTIME 90 tablet 1  . sertraline (ZOLOFT) 100 MG tablet Take 1.5 tablets (150 mg total) by mouth daily. 45 tablet 2  . traMADol (ULTRAM) 50 MG tablet Take 1 tablet (50 mg total) by mouth every 8 (eight) hours as needed for severe pain. 20 tablet 0  . fluticasone furoate-vilanterol (BREO ELLIPTA) 200-25 MCG/INH AEPB Inhale 1 puff into the lungs daily. (Patient not taking: Reported on 10/02/2016) 1 each 0  . mometasone-formoterol (DULERA) 200-5 MCG/ACT AERO Inhale 2 puffs into the lungs 2 (two) times daily. (Patient not taking: Reported on 10/02/2016) 1 Inhaler 1   No current facility-administered  medications on file prior to visit.     Allergies:   Allergies  Allergen Reactions  . Morphine Other (See Comments)    Nausea and Severe hallucinations  . Codeine Nausea Only  . Penicillins Other (See Comments)    LOC with shot  . Propoxyphene N-Acetaminophen Nausea Only    Physical Exam General: Obese elderly Caucasian male, seated, in no evident distress Head: head normocephalic and atraumatic.  Neck: supple with no carotid or supraclavicular bruits Cardiovascular: regular rate and rhythm, no murmurs Musculoskeletal: no deformity Skin:  no rash/petichiae Vascular:  Normal pulses all extremities Vitals:   10/02/16 1259  BP: 128/74  Pulse: 67   Neurologic Exam Mental Status: Awake and fully alert. Oriented to place and time. Recent and remote memory intact. Attention span, concentration and fund of knowledge appropriate. Mood and affect appropriate.  Cranial Nerves: Fundoscopic exam reveals sharp disc margins. Pupils equal, briskly reactive to light. Extraocular movements full without nystagmus. Visual fields full to confrontation. Hearing intact. Facial sensation intact. Face, tongue, palate moves normally and symmetrically.  Motor: Normal bulk and tone. Normal strength in all tested extremity muscles. Sensory.: Diminished left hemibody sensation to touch ,pinprick.[name] KitchenPosition and vibratory sensation diminished bilaterally over the toes Positive Romberg's sign Coordination: Rapid alternating movements normal in all extremities. Finger-to-nose and heel-to-shin performed accurately bilaterally. Gait and Station: Arises from chair with  difficulty. Stance is broad-based. Gait demonstrates mild imbalance and uses a wheeled walker. Unsteady while standing on a narrow base and either leg unsupported.  Reflexes: 1+ and symmetric except both ankle jerks are absent and knee jerks are depressed.. Toes downgoing.   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 Multiple vascular risk factors of atrial fibrillation, obesity, diabetes, hypertension hyperlipidemia    PLAN: I had a long d/w patient and his wife  about his recent medullary stroke, atrial fibrillation,risk for recurrent stroke/TIAs, personally independently reviewed imaging studies and stroke evaluation results and answered questions.Continue Eliquis as there is no data about switching to Pradaxa or Joya Salm is ant better for secondary stroke prevention and maintain strict control of hypertension with blood pressure goal below 130/90, diabetes with hemoglobin A1c goal below 6.5% and lipids with LDL cholesterol goal below 70 mg/dL. I also advised the patient to eat a healthy diet with plenty of whole grains, cereals, fruits and vegetables, exercise regularly and maintain ideal body weight. He may perhaps consider Watchman device in future if he has bleeding or recurrent strokes. I advised him to get up slowly and avoid sudden movements and to use a cane or walker at all times for safety He was given clearance to drive but advised to increase it gradually Followup in the future with my nurse practitioner in 6 months or call earlier if necessary Greater than 50% of time during this 30 minute visit was spent on counseling,explanation of diagnosis, planning of further management, discussion with patient and family and coordination of care  Antony Contras, MD Medical Director [place] Pager: [phone] 10/02/2016 1:43 PM  Note: This document was prepared with digital dictation and possible smart phrase technology. Any transcriptional errors that result from this process are unintentional

## 2016-10-02 NOTE — Patient Instructions (Addendum)
I had a long d/w patient and his wife  about his recent medullary stroke, atrial fibrillation,risk for recurrent stroke/TIAs, personally independently reviewed imaging studies and stroke evaluation results and answered questions.Continue Eliquis as there is no data about switching to Pradaxa or Joya Salm is ant better for secondary stroke prevention and maintain strict control of hypertension with blood pressure goal below 130/90, diabetes with hemoglobin A1c goal below 6.5% and lipids with LDL cholesterol goal below 70 mg/dL. I also advised the patient to eat a healthy diet with plenty of whole grains, cereals, fruits and vegetables, exercise regularly and maintain ideal body weight. He may perhaps consider Watchman device in future if he has bleeding or recurrent strokes. I advised him to get up slowly and avoid sudden movements and to use a cane or walker at all times for safety He was given clearance to drive but advised to increase it gradually Followup in the future with my nurse practitioner in 6 months or call earlier if necessary  Stroke Prevention Some medical conditions and behaviors are associated with an increased chance of having a stroke. You may prevent a stroke by making healthy choices and managing medical conditions. How can I reduce my risk of having a stroke?  Stay physically active. Get at least 30 minutes of activity on most or all days.  Do not smoke. It may also be helpful to avoid exposure to secondhand smoke.  Limit alcohol use. Moderate alcohol use is considered to be: ? No more than 2 drinks per day for men. ? No more than 1 drink per day for nonpregnant women.  Eat healthy foods. This involves: ? Eating 5 or more servings of fruits and vegetables a day. ? Making dietary changes that address high blood pressure (hypertension), high cholesterol, diabetes, or obesity.  Manage your cholesterol levels. ? Making food choices that are high in fiber and low in saturated fat,  trans fat, and cholesterol may control cholesterol levels. ? Take any prescribed medicines to control cholesterol as directed by your health care provider.  Manage your diabetes. ? Controlling your carbohydrate and sugar intake is recommended to manage diabetes. ? Take any prescribed medicines to control diabetes as directed by your health care provider.  Control your hypertension. ? Making food choices that are low in salt (sodium), saturated fat, trans fat, and cholesterol is recommended to manage hypertension. ? Ask your health care provider if you need treatment to lower your blood pressure. Take any prescribed medicines to control hypertension as directed by your health care provider. ? If you are 64-71 years of age, have your blood pressure checked every 3-5 years. If you are 72 years of age or older, have your blood pressure checked every year.  Maintain a healthy weight. ? Reducing calorie intake and making food choices that are low in sodium, saturated fat, trans fat, and cholesterol are recommended to manage weight.  Stop drug abuse.  Avoid taking birth control pills. ? Talk to your health care provider about the risks of taking birth control pills if you are over 77 years old, smoke, get migraines, or have ever had a blood clot.  Get evaluated for sleep disorders (sleep apnea). ? Talk to your health care provider about getting a sleep evaluation if you snore a lot or have excessive sleepiness.  Take medicines only as directed by your health care provider. ? For some people, aspirin or blood thinners (anticoagulants) are helpful in reducing the risk of forming abnormal blood clots that  can lead to stroke. If you have the irregular heart rhythm of atrial fibrillation, you should be on a blood thinner unless there is a good reason you cannot take them. ? Understand all your medicine instructions.  Make sure that other conditions (such as anemia or atherosclerosis) are  addressed. Get help right away if:  You have sudden weakness or numbness of the face, arm, or leg, especially on one side of the body.  Your face or eyelid droops to one side.  You have sudden confusion.  You have trouble speaking (aphasia) or understanding.  You have sudden trouble seeing in one or both eyes.  You have sudden trouble walking.  You have dizziness.  You have a loss of balance or coordination.  You have a sudden, severe headache with no known cause.  You have new chest pain or an irregular heartbeat. Any of these symptoms may represent a serious problem that is an emergency. Do not wait to see if the symptoms will go away. Get medical help at once. Call your local emergency services (911 in [place] Do not drive yourself to the hospital. This information is not intended to replace advice given to you by your health care provider. Make sure you discuss any questions you have with your health care provider. Document Released: 04/27/2004 Document Revised: 08/26/2015 Document Reviewed: 09/20/2012 Elsevier Interactive Patient Education  2017 [employer].

## 2016-10-02 NOTE — Telephone Encounter (Signed)
New message    Pt is calling to find out if papers were received from his physical therapist. Please call.

## 2016-10-02 NOTE — Telephone Encounter (Signed)
Returned call to patient and informed him there are no records in MD mailbox. He states Raylene Everts, PT sent to MD electronically but informed him they are not in MD basket. He will have PT print records and bring to PA appt tomorrow 10/03/16

## 2016-10-03 ENCOUNTER — Ambulatory Visit: Payer: Medicare Other | Attending: Physical Medicine & Rehabilitation | Admitting: Physical Therapy

## 2016-10-03 ENCOUNTER — Encounter: Payer: Self-pay | Admitting: Student

## 2016-10-03 ENCOUNTER — Ambulatory Visit (INDEPENDENT_AMBULATORY_CARE_PROVIDER_SITE_OTHER): Payer: Medicare Other | Admitting: Student

## 2016-10-03 VITALS — BP 151/87 | HR 70 | Ht 72.0 in | Wt 355.8 lb

## 2016-10-03 VITALS — BP 120/70

## 2016-10-03 DIAGNOSIS — I10 Essential (primary) hypertension: Secondary | ICD-10-CM

## 2016-10-03 DIAGNOSIS — G463 Brain stem stroke syndrome: Secondary | ICD-10-CM | POA: Diagnosis not present

## 2016-10-03 DIAGNOSIS — I69354 Hemiplegia and hemiparesis following cerebral infarction affecting left non-dominant side: Secondary | ICD-10-CM | POA: Diagnosis not present

## 2016-10-03 DIAGNOSIS — R2689 Other abnormalities of gait and mobility: Secondary | ICD-10-CM | POA: Insufficient documentation

## 2016-10-03 DIAGNOSIS — Z7901 Long term (current) use of anticoagulants: Secondary | ICD-10-CM | POA: Diagnosis not present

## 2016-10-03 DIAGNOSIS — I639 Cerebral infarction, unspecified: Secondary | ICD-10-CM

## 2016-10-03 DIAGNOSIS — G4733 Obstructive sleep apnea (adult) (pediatric): Secondary | ICD-10-CM

## 2016-10-03 DIAGNOSIS — R0609 Other forms of dyspnea: Secondary | ICD-10-CM | POA: Diagnosis not present

## 2016-10-03 DIAGNOSIS — R0600 Dyspnea, unspecified: Secondary | ICD-10-CM

## 2016-10-03 DIAGNOSIS — R2681 Unsteadiness on feet: Secondary | ICD-10-CM | POA: Diagnosis not present

## 2016-10-03 DIAGNOSIS — I480 Paroxysmal atrial fibrillation: Secondary | ICD-10-CM

## 2016-10-03 DIAGNOSIS — M6281 Muscle weakness (generalized): Secondary | ICD-10-CM | POA: Diagnosis not present

## 2016-10-03 NOTE — Therapy (Signed)
[facility] [address] [place], [place], [address] Phone: [phone]   Fax:  [phone]  Physical Therapy Treatment  Patient Details  Name: [name] MRN: [id number] Date of Birth: 1945/12/01 Referring Provider: Dr. Alysia Penna  Encounter Date: 10/03/2016      [name] End of Session - 10/03/16 1445    Visit Number 23   Number of Visits 34   Date for [name] Re-Evaluation 11/03/16   Authorization Type Medicare-G Codes and Progress Notes every 10th visit   [name] Start Time 1408   [name] Stop Time 1438  [name] had to leave early for MD appt   [name] Time Calculation (min) 30 min   Activity Tolerance Patient tolerated treatment well   Behavior During Therapy [facility] for tasks assessed/performed      Past Medical History:  Diagnosis Date  . Anemia 07/29/2013  . Asthma    childhood asthma  . Benign prostatic hypertrophy    several prostate biopsies neg for cancer.   . Bilateral hip pain   . Cancer ([place])   . CHF (congestive heart failure) ([place])   . Complication of anesthesia    MORPHINE CAUSES HALLUCINATIONS  . CVA (cerebral vascular accident) ([place]) 06/2016  . Depression   . FH: colonic polyps 2010  . Gout   . History of kidney stones   . Hypertension   . Kidney cysts   . Morbid obesity ([place])   . Numbness    feet / legs  . PAF (paroxysmal atrial fibrillation) with RVR 07/29/2013  . Sleep apnea    USES C PAP    Past Surgical History:  Procedure Laterality Date  . ARTHROSCOPY KNEE W/ DRILLING    . BREATH TEK H PYLORI N/A 12/23/2012   Procedure: BREATH TEK H PYLORI;  Surgeon: Gayland Curry, MD;  Location: [facility];  Service: General;  Laterality: N/A;  . CHOLECYSTECTOMY  1989  . COLONOSCOPY N/A 07/31/2013   Procedure: COLONOSCOPY;  Surgeon: Gatha Mayer, MD;  Location: [facility];  Service: Endoscopy;  Laterality: N/A;  . ESOPHAGOGASTRODUODENOSCOPY N/A 07/22/2013   Procedure: ESOPHAGOGASTRODUODENOSCOPY (EGD);   Surgeon: Irene Shipper, MD;  Location: [facility] ENDOSCOPY;  Service: Endoscopy;  Laterality: N/A;  . ESOPHAGOGASTRODUODENOSCOPY N/A 07/31/2013   Procedure: ESOPHAGOGASTRODUODENOSCOPY (EGD);  Surgeon: Gatha Mayer, MD;  Location: [facility] END[facility];  Service: Endoscopy;  Laterality: N/A;  . INTRAOPERATIVE TRANSESOPHAGEAL ECHOCARDIOGRAM N/A 07/18/2013   Procedure: INTRAOPERATIVE TRANSESOPHAGEAL ECHOCARDIOGRAM;  Surgeon: Grace Isaac, MD;  Location: [facility];  Service: Open Heart Surgery;  Laterality: N/A;  . LAPAROSCOPIC GASTRIC BANDING WITH HIATAL HERNIA REPAIR N/A 04/08/2013   Procedure: LAPAROSCOPIC GASTRIC BANDING WITH possible  HIATAL HERNIA REPAIR;  Surgeon: Gayland Curry, MD;  Location: [facility];  Service: General;  Laterality: N/A;  . LITHOTRIPSY    . renal calculi    . SUBXYPHOID PERICARDIAL WINDOW N/A 07/18/2013   Procedure: SUBXYPHOID PERICARDIAL WINDOW;  Surgeon: Grace Isaac, MD;  Location: [facility];  Service: Thoracic;  Laterality: N/A;  . TONSILLECTOMY    . total knee replacment     bilat  . trigger finger repair     also lue, cts surgically repaired  . WISDOM TOOTH EXTRACTION      Vitals:   10/03/16 1416  BP: 120/70        Subjective Assessment - 10/03/16 1444    Subjective [name] had therapy sessions mixed up; Cardiologist appointment today at 3.  Will need to leave early.  [name] reporting feeling more fatigued and out of breath today.  BP has been good   Patient is accompained by: Family member   Pertinent History asthma, basal cell carcinoma, obesity, gout, depression, HTN, OSA, peripheral neuropathy, CHF, PAF, PE, bil TKA   Limitations Walking   Patient Stated Goals improve walking, balance, wants to drive again and be independent   Currently in Pain? No/denies                         [facility] Adult [name] Treatment/Exercise - 10/03/16 1433      Ambulation/Gait   Ambulation/Gait Yes   Ambulation/Gait Assistance 4: Min assist   Ambulation/Gait Assistance Details  Performed gait with various community challenges including vertical and horizontal head turns, changes in direction, sudden stops, changes in gait speed, walking across compliant surface, side stepping and retro stepping   Ambulation Distance (Feet) 400 Feet   Assistive device Small based quad cane   Ambulation Surface Level;Unlevel;Indoor   Stairs Yes   Stairs Assistance 4: Min assist   Stairs Assistance Details (indicate cue type and reason) Performed 8 initially with cane and one rail alteranting sequence; switched to cane and no rail with step to sequence and min A   Stair Management Technique No rails;One rail Right;One rail Left;Alternating pattern;Step to pattern;Forwards;With cane   Number of Stairs 12   Height of Stairs 6             Balance Exercises - 10/03/16 1426      Balance Exercises: Standing   Standing, One Foot on a Step Eyes open;Head turns;4 inch  head turns L foot up, weight shifting R foot up             [name] Short Term Goals - 08/08/16 1452      [name] SHORT TERM GOAL #1   Title verbalize understanding of CVA risk factors/warning signs (08/10/16)   Status Achieved     [name] SHORT TERM GOAL #2   Title improve timed up and go to < 30 sec for improved mobility (08/10/16)   Status Achieved     [name] SHORT TERM GOAL #3   Title improve gait velocity to > 1.32 ft/sec for improved mobility and community access (08/10/16)   Status Achieved     [name] SHORT TERM GOAL #4   Title amb > 300' on level indoor/paved outdoor surfaces with LRAD with supervision for improved community access (08/10/16)   Status Achieved     [name] SHORT TERM GOAL #5   Title perform BERG with appropriate LTG to be written (08/10/16)   Status Achieved           [name] Long Term Goals - 09/22/16 1640      [name] LONG TERM GOAL #1   Title independent with HEP (NEW TARGET DATE OF 11/03/2016)   Time 8   Period Weeks   Status On-going     [name] LONG TERM GOAL #4   Title amb > 500' on various indoor/outdoor  surfaces and will negotiate up/down 7 stairs with cane and rail with supervision for improved independence and mobility (11/03/16)   Baseline 500' on outdoor pavement (uneven) with quad cane and min A; rollator with supervision   Time 8   Period Weeks   Status Revised     [name] LONG TERM GOAL #5   Title [name] will decrease falls risk as indicated by an increase in BERG balance score to > or = 45/56 (  11/03/16)   Baseline 42/56 on 09/22/16   Time 8   Period Weeks   Status Revised     [name] LONG TERM GOAL #6   Title [name] will improve gait velocity with quad cane to > or = 2.55ft/sec for decreased falls risk during household and limited community ambulation with LRAD (11/03/16)   Baseline 2.75 ft/sec with quad cane on 6/22   Time 8   Period Weeks   Status Revised               Plan - 10/03/16 1650    Clinical Impression Statement Continued to focus on single limb stance training, weight shifting and re-orientation to midline and higher level gait and stair negotiation training with quad cane.  [name] required frequent rest breaks due to LE fatigue and SOB but Sp02 remained >93% during entire session.  [name] had to leave session early to make cardiology appointment.  Will continue to address and progress towards LTG.   Rehab Potential Good   Clinical Impairments Affecting Rehab Potential multiple comorbidities   [name] Frequency 2x / week   [name] Duration 8 weeks   [name] Treatment/Interventions ADLs/Self Care Home Management;Cryotherapy;Electrical Stimulation;Neuromuscular re-education;Balance training;Therapeutic exercise;Therapeutic activities;Functional mobility training;Gait training;Stair training;Patient/family education;Orthotic Fit/Training;Manual techniques;Vestibular;Energy conservation;Moist Heat;DME Instruction   [name] Next Visit Plan Continue emphasis on gait with quad cane, weight shifting/ balance, compliant and outdoor surfaces, including head turns, endurance   Consulted and Agree with Plan of Care Patient       Patient will benefit from skilled therapeutic intervention in order to improve the following deficits and impairments:  Abnormal gait, Decreased balance, Decreased activity tolerance, Decreased strength, Postural dysfunction, Obesity, Decreased endurance, Decreased knowledge of use of DME, Decreased mobility, Difficulty walking, Dizziness, Decreased coordination, Impaired sensation, Impaired vision/preception  Visit Diagnosis: Hemiplegia and hemiparesis following cerebral infarction affecting left non-dominant side (HCC)  Other abnormalities of gait and mobility  Muscle weakness (generalized)  Unsteadiness on feet     Problem List Patient Active Problem List   Diagnosis Date Noted  . Benign essential HTN   . 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 Embolic cerebral infarction ([place]) 06/21/2016  . Stroke syndrome ([place])   . OSA (obstructive sleep apnea)   . Dysphagia, post-stroke   . Ataxia, post-stroke   . Neurologic gait disorder   . Encounter for monitoring flecainide therapy 04/28/2016  . Pure hyperglyceridemia 04/26/2016  . Recurrent pulmonary emboli ([place]) 06/13/2015  . GERD (gastroesophageal reflux disease) 06/13/2015  . Asthma 06/13/2015  . Basal cell carcinoma of skin 06/28/2014  . 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 Sinus bradycardia 12/15/2013  . PAF (paroxysmal atrial fibrillation) ([place]) 07/29/2013  . Chronic anticoagulation 07/22/2013  . Gastric ulcer with hemorrhage 07/20/13 07/22/2013  . Diastolic congestive heart failure ([place]) 07/16/2013  . H/O laparoscopic adjustable gastric banding & hiatal hernia repair 04/08/13 04/23/2013  . Prediabetes 02/05/2013  . TMJ (temporomandibular joint syndrome) 09/11/2012  . Morbid obesity ([place]) 08/09/2012  . Peripheral neuropathy ([place]) 11/02/2009  . Hyperlipidemia with target LDL less than 130 09/10/2008  . Depression 09/10/2008  . GOUT 06/28/2006  . BPH (benign prostatic hyperplasia) 06/28/2006    [name],  [name], [name] 10/03/16    4:54 PM    [facility] [name] [place], [place], [address] Phone: [phone]   Fax:  [phone]  Name: [name] MRN: [id number] Date of Birth: 07/28/1945

## 2016-10-03 NOTE — Progress Notes (Signed)
Cardiology Office Note    Date:  10/03/2016   ID:  [name], DOB [birth year], MRN [id number]  PCP:  [name] Lima, MD  Cardiologist: Dr. Debara Pickett   Chief Complaint  Patient presents with  . Follow-up    recent CVA, dyspnea on exertion    History of Present Illness:    [name] is a 71 y.o. male with past medical history of PAF (initially occurring in 07/2014, on Eliquis), HTN, OSA (on CPAP), chronic diastolic CHF, recent CVA (occuring in 06/2016), and prior PE who presents to the office today for evaluation of dyspnea on exertion.   He was last examined by Dr. Debara Pickett on 04/28/2016 and reported doing well from a cardiac perspective at that standpoint. His weight had increased from 337 lbs to 350 lbs. He did report progressive dyspnea on exertion which was thought to be secondary to his weight gain, but a NST was recommended to rule-out ischemia as he was on Flecainide for antiarrhythmic therapy. This showed no evidence of ischemia and overall a low-risk study.   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 Echo at that time showed a preserved EF of 60-65% with Grade 1 DD and no WMA. LA was mildly dilated and trivial TR was noted. He went to CIR from 3/21 until 07/12/2016 for therapy.   He has been participating in Outpatient PT since and has noticed worsening dyspnea when performing activities with therapy.   In talking with the patient today, he reports persistent pain with physical therapy 3 days per week. Following his recent stroke, he was initially barely able to sit up. He has advanced well and is now able to walk with a rolling walker for long distances and a cane at home. He has noted dyspnea with activity occurring while on therapy. Oxygen saturations have been checked there and were 89% with ambulation 2 weeks prior but improved to 95% with rest. He has  been checking his oxygen saturations at home and reports they have remained greater than 92%.  He denies any recent chest discomfort, palpitations, lightheadedness, dizziness, presyncope, orthopnea, PND, or lower extremity edema. He is still having issues with dysphasia but reports this has improved and he is no longer working with speech therapy.  Past Medical History:  Diagnosis Date  . Anemia 07/29/2013  . Asthma    childhood asthma  . Benign prostatic hypertrophy    several prostate biopsies neg for cancer.   . Bilateral hip pain   . Cancer ([place])   . CHF (congestive heart failure) ([place])   . Complication of anesthesia    MORPHINE CAUSES HALLUCINATIONS  . CVA (cerebral vascular accident) ([place]) 06/2016  . Depression   . FH: colonic polyps 2010  . Gout   . History of kidney stones   . Hypertension   . Kidney cysts   . Morbid obesity ([place])   . Numbness    feet / legs  . PAF (paroxysmal atrial fibrillation) with RVR 07/29/2013  . Sleep apnea    USES C PAP    Past Surgical History:  Procedure Laterality Date  . ARTHROSCOPY KNEE W/ DRILLING    . BREATH TEK H PYLORI N/A 12/23/2012   Procedure: BREATH TEK H PYLORI;  Surgeon: Gayland Curry, MD;  Location: [facility];  Service: General;  Laterality: N/A;  . CHOLECYSTECTOMY  1989  .  COLONOSCOPY N/A 07/31/2013   Procedure: COLONOSCOPY;  Surgeon: Gatha Mayer, MD;  Location: [facility];  Service: Endoscopy;  Laterality: N/A;  . ESOPHAGOGASTRODUODENOSCOPY N/A 07/22/2013   Procedure: ESOPHAGOGASTRODUODENOSCOPY (EGD);  Surgeon: Irene Shipper, MD;  Location: [facility] ENDOSCOPY;  Service: Endoscopy;  Laterality: N/A;  . ESOPHAGOGASTRODUODENOSCOPY N/A 07/31/2013   Procedure: ESOPHAGOGASTRODUODENOSCOPY (EGD);  Surgeon: Gatha Mayer, MD;  Location: [facility] ENDOSCOPY;  Service: Endoscopy;  Laterality: N/A;  . INTRAOPERATIVE TRANSESOPHAGEAL ECHOCARDIOGRAM N/A 07/18/2013   Procedure: INTRAOPERATIVE TRANSESOPHAGEAL ECHOCARDIOGRAM;  Surgeon: Grace Isaac, MD;  Location: [facility];  Service: Open Heart Surgery;  Laterality: N/A;  . LAPAROSCOPIC GASTRIC BANDING WITH HIATAL HERNIA REPAIR N/A 04/08/2013   Procedure: LAPAROSCOPIC GASTRIC BANDING WITH possible  HIATAL HERNIA REPAIR;  Surgeon: Gayland Curry, MD;  Location: [facility];  Service: General;  Laterality: N/A;  . LITHOTRIPSY    . renal calculi    . SUBXYPHOID PERICARDIAL WINDOW N/A 07/18/2013   Procedure: SUBXYPHOID PERICARDIAL WINDOW;  Surgeon: Grace Isaac, MD;  Location: [facility];  Service: Thoracic;  Laterality: N/A;  . TONSILLECTOMY    . total knee replacment     bilat  . trigger finger repair     also lue, cts surgically repaired  . WISDOM TOOTH EXTRACTION      Current Medications: Outpatient Medications Prior to Visit  Medication Sig Dispense Refill  . apixaban (ELIQUIS) 5 MG TABS tablet Take 1 tablet (5 mg total) by mouth 2 (two) times daily. 180 tablet 3  . aspirin 81 MG tablet Take 81 mg by mouth 2 (two) times daily.     [name] atorvastatin (LIPITOR) 80 MG tablet Take 1 tablet (80 mg total) by mouth daily. 90 tablet 1  . budesonide-formoterol (SYMBICORT) 160-4.5 MCG/ACT inhaler Inhale 2 puffs into the lungs 2 (two) times daily as needed (shortness of breath).    . CALCIUM PO Take 500 mg by mouth 3 (three) times daily.     . Cyanocobalamin (VITAMIN B-12) 1000 MCG SUBL Place 1,000 mcg under the tongue every evening.     [name] Sodium (DSS) 100 MG CAPS Take 100 mg by mouth every other day.    . finasteride (PROSCAR) 5 MG tablet Take 1 tablet (5 mg total) by mouth daily. 30 tablet 1  . flecainide (TAMBOCOR) 50 MG tablet Take 1 tablet (50 mg total) by mouth every 12 (twelve) hours. 180 tablet 2  . fluticasone furoate-vilanterol (BREO ELLIPTA) 200-25 MCG/INH AEPB Inhale 1 puff into the lungs daily. 1 each 0  . furosemide (LASIX) 40 MG tablet TAKE 1 TABLET BY MOUTH EVERY DAY 90 tablet 1  . KLOR-CON M20 20 MEQ tablet TAKE 2 TABLETS BY MOUTH EVERY DAY 60 tablet 3  . loratadine  (CLARITIN) 10 MG tablet Take 10 mg by mouth daily.    [name] LORazepam (ATIVAN) 0.5 MG tablet Take 1 tablet (0.5 mg total) by mouth at bedtime. 30 tablet 2  . montelukast (SINGULAIR) 10 MG tablet TAKE 1 TABLET BY MOUTH ONCE A DAY AT BEDTIME 30 tablet 1  . Multiple Vitamins-Iron (MULTIVITAMINS WITH IRON) TABS tablet Take 1 tablet by mouth daily.    . pantoprazole (PROTONIX) 40 MG tablet TAKE 1 TABLET BY MOUTH EVERY DAY 90 tablet 1  . rOPINIRole (REQUIP) 0.5 MG tablet TAKE 1 TABLET AT BEDTIME 90 tablet 1  . sertraline (ZOLOFT) 100 MG tablet Take 1.5 tablets (150 mg total) by mouth daily. 45 tablet 2  . traMADol (ULTRAM) 50 MG tablet Take  1 tablet (50 mg total) by mouth every 8 (eight) hours as needed for severe pain. 20 tablet 0  . mometasone-formoterol (DULERA) 200-5 MCG/ACT AERO Inhale 2 puffs into the lungs 2 (two) times daily. 1 Inhaler 1   No facility-administered medications prior to visit.      Allergies:   Morphine; Codeine; Penicillins; and Propoxyphene n-acetaminophen   Social History   Social History  . Marital status: Married    Spouse name: N/A  . Number of children: 1  . Years of education: N/A   Occupational History  . retired-Manager [employer]    Social History Main Topics  . Smoking status: Former Smoker    Packs/day: 0.20    Years: 13.00    Types: Cigarettes    Start date: 04/03/1961    Quit date: 08/30/1974  . Smokeless tobacco: Never Used  . Alcohol use No     Comment: Occasionally; 1 small drink a week  . Drug use: No  . Sexual activity: Not Currently   Other Topics Concern  . None   Social History Narrative   Lives at home w/ his wife   Right-handed   Caffeine: decaf coffee, occasional tea     Family History:  The patient's family history includes Dementia in his mother; Depression in his brother and mother; Diabetes in his cousin and paternal uncle; Heart disease in his father, paternal grandmother, and paternal uncle; Hypertension in his mother; Stroke in  his paternal aunt.   Review of Systems:   Please see the history of present illness.     General:  No chills, fever, night sweats or weight changes.  Cardiovascular:  No chest pain, edema, orthopnea, palpitations, paroxysmal nocturnal dyspnea. Positive for dyspnea on exertion.  Dermatological: No rash, lesions/masses Respiratory: No cough, dyspnea Urologic: No hematuria, dysuria Abdominal:   No nausea, vomiting, diarrhea, bright red blood per rectum, melena, or hematemesis Neurologic:  No visual changes, changes in mental status. Positive for left-sided weakness.   All other systems reviewed and are otherwise negative except as noted above.   Physical Exam:    VS:  BP (!) 151/87   Pulse 70   Ht 6' (1.829 m)   Wt (!) 355 lb 12.8 oz (161.4 kg)   BMI 48.26 kg/m    General: Well developed, morbidly obese Caucasian male appearing in no acute distress. Head: Normocephalic, atraumatic, sclera non-icteric, no xanthomas, nares are without discharge.  Neck: No carotid bruits. JVD is difficult to assess secondary to body habitus.  Lungs: Respirations regular and unlabored, without wheezes or rales.  Heart: Regular rate and rhythm. No S3 or S4.  No murmur, no rubs, or gallops appreciated. Abdomen: Soft, non-tender, non-distended with normoactive bowel sounds. No hepatomegaly. No rebound/guarding. No obvious abdominal masses. Msk: Tone appears normal for age. Strength mildly diminished along the left-side. No joint deformities or effusions. Extremities: No clubbing or cyanosis. No pitting edema.  Distal pedal pulses are 2+ bilaterally. Neuro: Alert and oriented X 3. Moves all extremities spontaneously. No focal deficits noted. Psych:  Responds to questions appropriately with a normal affect. Skin: No rashes or lesions noted  Wt Readings from Last 3 Encounters:  10/03/16 (!) 355 lb 12.8 oz (161.4 kg)  10/02/16 (!) 357 lb (161.9 kg)  07/17/16 (!) 351 lb 8 oz (159.4 kg)     Studies/Labs  Reviewed:   EKG:  EKG is not ordered today.  EKG from 06/19/2016 is reviewed which shows NSR, HR 62, with no acute ST or  T-wave changes.   Recent Labs: 10/20/2015: TSH 2.68 06/21/2016: Magnesium 2.2 06/22/2016: ALT 19 07/10/2016: Hemoglobin 13.2; Platelets 196 07/12/2016: BUN 19; Creatinine, Ser 1.05; Potassium 3.6; Sodium 143   Lipid Panel    Component Value Date/Time   CHOL 124 06/19/2016 0730   TRIG 74 06/19/2016 0730   HDL 32 (L) 06/19/2016 0730   CHOLHDL 3.9 06/19/2016 0730   VLDL 15 06/19/2016 0730   LDLCALC 77 06/19/2016 0730   LDLDIRECT 168.0 10/20/2015 1417    Additional studies/ records that were reviewed today include:   Echocardiogram: 06/19/2016 Study Conclusions  - Left ventricle: The cavity size was moderately dilated. Wall   thickness was increased in a pattern of mild LVH. Systolic   function was normal. The estimated ejection fraction was in the   range of 60% to 65%. Wall motion was normal; there were no   regional wall motion abnormalities. Doppler parameters are   consistent with abnormal left ventricular relaxation (grade 1   diastolic dysfunction). Doppler parameters are consistent with   indeterminate ventricular filling pressure. - Aortic valve: Transvalvular velocity was within the normal range.   There was no stenosis. There was no regurgitation. - Mitral valve: Transvalvular velocity was within the normal range.   There was no evidence for stenosis. There was no regurgitation. - Left atrium: The atrium was mildly dilated. - Right ventricle: The cavity size was normal. Wall thickness was   normal. Systolic function was normal. - Right atrium: The atrium was mildly dilated. - Tricuspid valve: There was trivial regurgitation.  NST: 05/17/2016  The left ventricular ejection fraction is normal (55-65%).  Nuclear stress EF: 55%.  There was no ST segment deviation noted during stress.  No T wave inversion was noted during stress.  The study is  normal.  This is a low risk study.   Assessment:    1. PAF (paroxysmal atrial fibrillation) with RVR   2. Current use of long term anticoagulation   3. Dyspnea on exertion   4. Essential hypertension   5. Obstructive sleep apnea   6. Cerebrovascular accident (CVA), unspecified mechanism ([place])      Plan:   In order of problems listed above:  1. Paroxysmal Atrial Fibrillation/ Long-term Anticoagulation - he has a known history of PAF, initially occurring in 07/2014. - he has remained on Flecainide and denies any recent palpitations. Telemetry during his 06/2016 admission showed no recurrent episodes of PAF.  - continue with Flecainide 50mg  BID.  - This patients CHA2DS2-VASc Score and unadjusted Ischemic Stroke Rate (% per year) is equal to 4.8 % stroke rate/year from a score of 4 (HTN, Age, PE (2)). He remains on Eliquis 5mg  BID for anticoagulation. Could consider placement of a Watchman device in the future in the setting of PAF and recent CVA, as this was also discussed with him by Dr. Leonie Man. Would wait until he is further out from his recent CVA prior to considering any invasive procedures.    2. Dyspnea on Exertion - reports having dyspnea on exertion since his recent CVA. Notes this has improved with CIR and Outpatient PT. O2 saturations were initially in the high-80's with ambulation but have been remaining > 92% over the past several weeks.  - he denies any associated chest pain, palpitations, orthopnea, PND, or lower extremity edema. - he does not appear volume overloaded on physical examination. Remains on Lasix 40 mg daily.  - likely secondary to deconditioning in the setting of his obesity and poor functional  status over the past few months. Would not pursue further ischemic evaluation at this time as NST in 05/2016 was low-risk. - 3 inhalers are listed on his medication list but he reports not taking any of these as he does not know why they were prescribed. I recommended he  follow-up with his PCP to see which ones he should currently be taking as this could be playing a factor in his symptoms as well.   3. HTN - BP elevated at 151/87 during today's visit. Has been well-controlled with SBP in the 120's - 130's during PT. At 128/74 during his recent Neurology visit.  - continue to monitor in the ambulatory setting.   4. OSA - continued compliance with CPAP encouraged.   5. CVA - occurring in 06/2016. Being followed by Neurology.   Medication Adjustments/Labs and Tests Ordered: Current medicines are reviewed at length with the patient today.  Concerns regarding medicines are outlined above.  Medication changes, Labs and Tests ordered today are listed in the Patient Instructions below. Patient Instructions  Medication Instructions:  Your physician recommends that you continue on your current medications as directed. Please refer to the Current Medication list given to you today.  Labwork: none  Testing/Procedures: none  Follow-Up: Keep follow up as scheduled   If you need a refill on your cardiac medications before your next appointment, please call your pharmacy.   Signed, Erma Heritage, PA-C  10/03/2016 8:38 PM    [facility] [place], [address], [place]  [address] Phone: [phone]; Fax: [phone]  [address] [place], [place] [address] Phone: [phone]

## 2016-10-03 NOTE — Patient Instructions (Signed)
Medication Instructions:  Your physician recommends that you continue on your current medications as directed. Please refer to the Current Medication list given to you today.  Labwork: none  Testing/Procedures: none  Follow-Up: Keep follow up as scheduled   If you need a refill on your cardiac medications before your next appointment, please call your pharmacy.

## 2016-10-06 ENCOUNTER — Encounter: Payer: Self-pay | Admitting: Physical Therapy

## 2016-10-06 ENCOUNTER — Ambulatory Visit: Payer: Medicare Other | Admitting: Physical Therapy

## 2016-10-06 DIAGNOSIS — M6281 Muscle weakness (generalized): Secondary | ICD-10-CM

## 2016-10-06 DIAGNOSIS — I69354 Hemiplegia and hemiparesis following cerebral infarction affecting left non-dominant side: Secondary | ICD-10-CM

## 2016-10-06 DIAGNOSIS — R2689 Other abnormalities of gait and mobility: Secondary | ICD-10-CM

## 2016-10-06 DIAGNOSIS — R2681 Unsteadiness on feet: Secondary | ICD-10-CM

## 2016-10-06 NOTE — Therapy (Signed)
[facility] [address] [place], [place], [address] Phone: [phone]   Fax:  [phone]  Physical Therapy Treatment  Patient Details  Name: [name] MRN: [id number] Date of Birth: January 07, 1946 Referring Provider: Dr. Alysia Penna  Encounter Date: 10/06/2016      PT End of Session - 10/06/16 1333    Visit Number 24   Number of Visits 34   Date for PT Re-Evaluation 11/03/16   Authorization Type Medicare-G Codes and Progress Notes every 10th visit   PT Start Time 1233   PT Stop Time 1314   PT Time Calculation (min) 41 min   Equipment Utilized During Treatment Gait belt   Activity Tolerance Patient tolerated treatment well   Behavior During Therapy [facility] for tasks assessed/performed      Past Medical History:  Diagnosis Date  . Anemia 07/29/2013  . Asthma    childhood asthma  . Benign prostatic hypertrophy    several prostate biopsies neg for cancer.   . Bilateral hip pain   . Cancer ([place])   . CHF (congestive heart failure) ([place])   . Complication of anesthesia    MORPHINE CAUSES HALLUCINATIONS  . CVA (cerebral vascular accident) ([place]) 06/2016  . Depression   . FH: colonic polyps 2010  . Gout   . History of kidney stones   . Hypertension   . Kidney cysts   . Morbid obesity ([place])   . Numbness    feet / legs  . PAF (paroxysmal atrial fibrillation) with RVR 07/29/2013  . Sleep apnea    USES C PAP    Past Surgical History:  Procedure Laterality Date  . ARTHROSCOPY KNEE W/ DRILLING    . BREATH TEK H PYLORI N/A 12/23/2012   Procedure: BREATH TEK H PYLORI;  Surgeon: Gayland Curry, MD;  Location: [facility];  Service: General;  Laterality: N/A;  . CHOLECYSTECTOMY  1989  . COLONOSCOPY N/A 07/31/2013   Procedure: COLONOSCOPY;  Surgeon: Gatha Mayer, MD;  Location: [facility];  Service: Endoscopy;  Laterality: N/A;  . ESOPHAGOGASTRODUODENOSCOPY N/A 07/22/2013   Procedure:  ESOPHAGOGASTRODUODENOSCOPY (EGD);  Surgeon: Irene Shipper, MD;  Location: [facility] ENDOSCOPY;  Service: Endoscopy;  Laterality: N/A;  . ESOPHAGOGASTRODUODENOSCOPY N/A 07/31/2013   Procedure: ESOPHAGOGASTRODUODENOSCOPY (EGD);  Surgeon: Gatha Mayer, MD;  Location: [facility] ENDOSCOPY;  Service: Endoscopy;  Laterality: N/A;  . INTRAOPERATIVE TRANSESOPHAGEAL ECHOCARDIOGRAM N/A 07/18/2013   Procedure: INTRAOPERATIVE TRANSESOPHAGEAL ECHOCARDIOGRAM;  Surgeon: Grace Isaac, MD;  Location: [facility];  Service: Open Heart Surgery;  Laterality: N/A;  . LAPAROSCOPIC GASTRIC BANDING WITH HIATAL HERNIA REPAIR N/A 04/08/2013   Procedure: LAPAROSCOPIC GASTRIC BANDING WITH possible  HIATAL HERNIA REPAIR;  Surgeon: Gayland Curry, MD;  Location: [facility];  Service: General;  Laterality: N/A;  . LITHOTRIPSY    . renal calculi    . SUBXYPHOID PERICARDIAL WINDOW N/A 07/18/2013   Procedure: SUBXYPHOID PERICARDIAL WINDOW;  Surgeon: Grace Isaac, MD;  Location: [facility];  Service: Thoracic;  Laterality: N/A;  . TONSILLECTOMY    . total knee replacment     bilat  . trigger finger repair     also lue, cts surgically repaired  . WISDOM TOOTH EXTRACTION      There were no vitals filed for this visit.      Subjective Assessment - 10/06/16 1237    Subjective Patient reports no falls or pain today. He reported that he still gets dizzy when making sharp movements with his head  like looking behind him. He went to the cardiologist this week and had his medications reviewed.    Patient Stated Goals improve walking, balance, wants to drive again and be independent   Currently in Pain? No/denies              [facility] Adult PT Treatment/Exercise - 10/06/16 1306      Ambulation/Gait   Ambulation/Gait Yes   Ambulation/Gait Assistance 4: Min guard;4: Min assist   Ambulation/Gait Assistance Details required more assistance when changing surface from pavement <> grass and going up and down inclines   Ambulation Distance (Feet) 450 Feet    Assistive device Small based quad cane   Ambulation Surface Level;Unlevel;Indoor;Paved;Outdoor;Grass   Gait Comments --     High Level Balance   High Level Balance Comments gait along back of gym: forward gait with head movements left<>right and up<>down x 3 laps each, sudden stops and stops with pivot x3 with cane and min guard to min assist for balance. Patient attempted ambulation without cane but with fatigue needed his AD. in parallel bars; lg rockerboard EO standing balance 2x30sec, head turns right <>left and up <> down 10x (min assist/guard), EC 2x30sec standing balance (min to mod assist), foam beam stepping onto 10x both left and right foot, stepping off and then back 10x, min assist fo balance; SLS bil 3x each leg attempted held for as long as able (min to mod assist)                                cues for weight shifting and posture              PT Short Term Goals - 08/08/16 1452      PT SHORT TERM GOAL #1   Title verbalize understanding of CVA risk factors/warning signs (08/10/16)   Status Achieved     PT SHORT TERM GOAL #2   Title improve timed up and go to < 30 sec for improved mobility (08/10/16)   Status Achieved     PT SHORT TERM GOAL #3   Title improve gait velocity to > 1.32 ft/sec for improved mobility and community access (08/10/16)   Status Achieved     PT SHORT TERM GOAL #4   Title amb > 300' on level indoor/paved outdoor surfaces with LRAD with supervision for improved community access (08/10/16)   Status Achieved     PT SHORT TERM GOAL #5   Title perform BERG with appropriate LTG to be written (08/10/16)   Status Achieved           PT Long Term Goals - 09/22/16 1640      PT LONG TERM GOAL #1   Title independent with HEP (NEW TARGET DATE OF 11/03/2016)   Time 8   Period Weeks   Status On-going     PT LONG TERM GOAL #4   Title amb > 500' on various indoor/outdoor surfaces and will negotiate up/down 7 stairs with cane and rail with supervision  for improved independence and mobility (11/03/16)   Baseline 500' on outdoor pavement (uneven) with quad cane and min A; rollator with supervision   Time 8   Period Weeks   Status Revised     PT LONG TERM GOAL #5   Title Pt will decrease falls risk as indicated by an increase in BERG balance score to > or = 45/56 (11/03/16)   Baseline 42/56 on 09/22/16  Time 8   Period Weeks   Status Revised     PT LONG TERM GOAL #6   Title Pt will improve gait velocity with quad cane to > or = 2.19ft/sec for decreased falls risk during household and limited community ambulation with LRAD (11/03/16)   Baseline 2.75 ft/sec with quad cane on 6/22   Time 8   Period Weeks   Status Revised               Plan - 10/06/16 1334    Clinical Impression Statement Today's session continued to focus on higher level balance training and gait training with balance challenges and on compliant surfaces. Patient was able to ambulate outside on both paved and grassy surfaces with minimal assistance. He continues to fatigue quickly with exercise and has SOB. He would benefit from continued PT to address goals.   Clinical Impairments Affecting Rehab Potential multiple comorbidities   PT Frequency 2x / week   PT Duration 8 weeks   PT Treatment/Interventions ADLs/Self Care Home Management;Cryotherapy;Electrical Stimulation;Neuromuscular re-education;Balance training;Therapeutic exercise;Therapeutic activities;Functional mobility training;Gait training;Stair training;Patient/family education;Orthotic Fit/Training;Manual techniques;Vestibular;Energy conservation;Moist Heat;DME Instruction   PT Next Visit Plan Continue emphasis on gait with quad cane, weight shifting/ balance, compliant and outdoor surfaces, including head turns, endurance   Consulted and Agree with Plan of Care Patient   Family Member Consulted wife      Patient will benefit from skilled therapeutic intervention in order to improve the following deficits and  impairments:  Abnormal gait, Decreased balance, Decreased activity tolerance, Decreased strength, Postural dysfunction, Obesity, Decreased endurance, Decreased knowledge of use of DME, Decreased mobility, Difficulty walking, Dizziness, Decreased coordination, Impaired sensation, Impaired vision/preception  Visit Diagnosis: Hemiplegia and hemiparesis following cerebral infarction affecting left non-dominant side (HCC)  Other abnormalities of gait and mobility  Muscle weakness (generalized)  Unsteadiness on feet     Problem List Patient Active Problem List   Diagnosis Date Noted  . Benign essential HTN   . 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 Embolic cerebral infarction ([place]) 06/21/2016  . Stroke syndrome ([place])   . OSA (obstructive sleep apnea)   . Dysphagia, post-stroke   . Ataxia, post-stroke   . Neurologic gait disorder   . Encounter for monitoring flecainide therapy 04/28/2016  . Pure hyperglyceridemia 04/26/2016  . Recurrent pulmonary emboli ([place]) 06/13/2015  . GERD (gastroesophageal reflux disease) 06/13/2015  . Asthma 06/13/2015  . Basal cell carcinoma of skin 06/28/2014  . 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 Sinus bradycardia 12/15/2013  . PAF (paroxysmal atrial fibrillation) ([place]) 07/29/2013  . Chronic anticoagulation 07/22/2013  . Gastric ulcer with hemorrhage 07/20/13 07/22/2013  . Diastolic congestive heart failure ([name]) 07/16/2013  . H/O laparoscopic adjustable gastric banding & hiatal hernia repair 04/08/13 04/23/2013  . Prediabetes 02/05/2013  . TMJ (temporomandibular joint syndrome) 09/11/2012  . Morbid obesity ([place]) 08/09/2012  . Peripheral neuropathy ([place]) 11/02/2009  . Hyperlipidemia with target LDL less than 130 09/10/2008  . Depression 09/10/2008  . GOUT 06/28/2006  . BPH (benign prostatic hyperplasia) 06/28/2006    Skip Estimable, SPTA  10/06/2016, 1:51 PM  [facility] [address] [place], [place], [address] Phone: [phone]   Fax:  [phone]  Name: [name] MRN: [id number] Date of Birth: 05-Jun-1945

## 2016-10-09 ENCOUNTER — Ambulatory Visit: Payer: Medicare Other | Admitting: Physical Therapy

## 2016-10-10 ENCOUNTER — Ambulatory Visit: Payer: Medicare Other | Admitting: Physical Therapy

## 2016-10-10 DIAGNOSIS — R2689 Other abnormalities of gait and mobility: Secondary | ICD-10-CM | POA: Diagnosis not present

## 2016-10-10 DIAGNOSIS — M6281 Muscle weakness (generalized): Secondary | ICD-10-CM

## 2016-10-10 DIAGNOSIS — R2681 Unsteadiness on feet: Secondary | ICD-10-CM

## 2016-10-10 DIAGNOSIS — I69354 Hemiplegia and hemiparesis following cerebral infarction affecting left non-dominant side: Secondary | ICD-10-CM

## 2016-10-10 NOTE — Therapy (Signed)
[facility] [address] [place], [place], [address] Phone: [phone]   Fax:  [phone]  Physical Therapy Treatment  Patient Details  Name: [name] MRN: [id number] Date of Birth: [birth year] Referring Provider: Dr. Alysia Penna  Encounter Date: 10/10/2016      PT End of Session - 10/10/16 1645    Visit Number 25   Number of Visits 34   Date for PT Re-Evaluation 11/03/16   Authorization Type Medicare-G Codes and Progress Notes every 10th visit   PT Start Time 1530   PT Stop Time 1610   PT Time Calculation (min) 40 min   Equipment Utilized During Treatment Gait belt   Activity Tolerance Patient tolerated treatment well   Behavior During Therapy [facility] for tasks assessed/performed      Past Medical History:  Diagnosis Date  . Anemia 07/29/2013  . Asthma    childhood asthma  . Benign prostatic hypertrophy    several prostate biopsies neg for cancer.   . Bilateral hip pain   . Cancer ([place])   . CHF (congestive heart failure) ([place])   . Complication of anesthesia    MORPHINE CAUSES HALLUCINATIONS  . CVA (cerebral vascular accident) ([place]) 06/2016  . Depression   . FH: colonic polyps 2010  . Gout   . History of kidney stones   . Hypertension   . Kidney cysts   . Morbid obesity ([place])   . Numbness    feet / legs  . PAF (paroxysmal atrial fibrillation) with RVR 07/29/2013  . Sleep apnea    USES C PAP    Past Surgical History:  Procedure Laterality Date  . ARTHROSCOPY KNEE W/ DRILLING    . BREATH TEK H PYLORI N/A 12/23/2012   Procedure: BREATH TEK H PYLORI;  Surgeon: Gayland Curry, MD;  Location: [facility];  Service: General;  Laterality: N/A;  . CHOLECYSTECTOMY  1989  . COLONOSCOPY N/A 07/31/2013   Procedure: COLONOSCOPY;  Surgeon: Gatha Mayer, MD;  Location: [facility];  Service: Endoscopy;  Laterality: N/A;  . ESOPHAGOGASTRODUODENOSCOPY N/A 07/22/2013   Procedure:  ESOPHAGOGASTRODUODENOSCOPY (EGD);  Surgeon: Irene Shipper, MD;  Location: [facility] ENDOSCOPY;  Service: Endoscopy;  Laterality: N/A;  . ESOPHAGOGASTRODUODENOSCOPY N/A 07/31/2013   Procedure: ESOPHAGOGASTRODUODENOSCOPY (EGD);  Surgeon: Gatha Mayer, MD;  Location: [facility] ENDOSCOPY;  Service: Endoscopy;  Laterality: N/A;  . INTRAOPERATIVE TRANSESOPHAGEAL ECHOCARDIOGRAM N/A 07/18/2013   Procedure: INTRAOPERATIVE TRANSESOPHAGEAL ECHOCARDIOGRAM;  Surgeon: Grace Isaac, MD;  Location: [facility];  Service: Open Heart Surgery;  Laterality: N/A;  . LAPAROSCOPIC GASTRIC BANDING WITH HIATAL HERNIA REPAIR N/A 04/08/2013   Procedure: LAPAROSCOPIC GASTRIC BANDING WITH possible  HIATAL HERNIA REPAIR;  Surgeon: Gayland Curry, MD;  Location: [facility];  Service: General;  Laterality: N/A;  . LITHOTRIPSY    . renal calculi    . SUBXYPHOID PERICARDIAL WINDOW N/A 07/18/2013   Procedure: SUBXYPHOID PERICARDIAL WINDOW;  Surgeon: Grace Isaac, MD;  Location: [facility];  Service: Thoracic;  Laterality: N/A;  . TONSILLECTOMY    . total knee replacment     bilat  . trigger finger repair     also lue, cts surgically repaired  . WISDOM TOOTH EXTRACTION      There were no vitals filed for this visit.      Subjective Assessment - 10/10/16 1723    Subjective Patient reports no falls or pain today. He had to cancel yesterday's appointment due to feeling off balance and "wobbly" yesterday.  He stated he is feeling much better today.             [facility] Adult PT Treatment/Exercise - 10/10/16 1622      Ambulation/Gait   Ambulation/Gait Yes   Ambulation/Gait Assistance 5: Supervision   Ambulation/Gait Assistance Details VC to increase step length   Ambulation Distance (Feet) 200 Feet   Assistive device Small based quad cane   Gait Pattern Decreased stance time - right;Decreased stance time - left;Step-through pattern;Wide base of support;Decreased step length - right;Decreased step length - left;Decreased arm swing - left    Ambulation Surface Level;Unlevel;Indoor   Stairs Yes   Stairs Assistance 5: Supervision   Stairs Assistance Details (indicate cue type and reason) Performed 3 back to back laps ascending/descending stairs using L handrail and cane to ascend stairs and right handrail and cane to descend stairs, pt performed independently with accuracy.   Stair Management Technique With cane;Step to pattern;One rail Right;One rail Left   Number of Stairs 12   Height of Stairs 6   Ramp 5: Supervision   Ramp Details (indicate cue type and reason) ascended x1 with accuracy   Curb 5: Supervision   Curb Details (indicate cue type and reason) descended curb x1 with accuracy     High level balance:  Compliant surface: blue foam beam placed in corner with chair in front for safety: --static balance EO 20 seconds x3 --progressing to head turns right<>left, up<>down x10 each, no UE support, min guard; VC for weight shifting and posture, LOB and step/R foot roll off foam beam x1.   2 Red mats placed at counter:  --slow alternating SLS with 3 second hold with unilateral UE support; VC's required to slow down, stand tall, and focus on weight shifting, min guard -- 2 laps high knee marching forward and backward, VC's improved posture and balance over weight bearing extremity, min guard.       PT Short Term Goals - 08/08/16 1452      PT SHORT TERM GOAL #1   Title verbalize understanding of CVA risk factors/warning signs (08/10/16)   Status Achieved     PT SHORT TERM GOAL #2   Title improve timed up and go to < 30 sec for improved mobility (08/10/16)   Status Achieved     PT SHORT TERM GOAL #3   Title improve gait velocity to > 1.32 ft/sec for improved mobility and community access (08/10/16)   Status Achieved     PT SHORT TERM GOAL #4   Title amb > 300' on level indoor/paved outdoor surfaces with LRAD with supervision for improved community access (08/10/16)   Status Achieved     PT SHORT TERM GOAL #5   Title  perform BERG with appropriate LTG to be written (08/10/16)   Status Achieved           PT Long Term Goals - 09/22/16 1640      PT LONG TERM GOAL #1   Title independent with HEP (NEW TARGET DATE OF 11/03/2016)   Time 8   Period Weeks   Status On-going     PT LONG TERM GOAL #4   Title amb > 500' on various indoor/outdoor surfaces and will negotiate up/down 7 stairs with cane and rail with supervision for improved independence and mobility (11/03/16)   Baseline 500' on outdoor pavement (uneven) with quad cane and min A; rollator with supervision   Time 8   Period Weeks   Status Revised  PT LONG TERM GOAL #5   Title Pt will decrease falls risk as indicated by an increase in BERG balance score to > or = 45/56 (11/03/16)   Baseline 42/56 on 09/22/16   Time 8   Period Weeks   Status Revised     PT LONG TERM GOAL #6   Title Pt will improve gait velocity with quad cane to > or = 2.64ft/sec for decreased falls risk during household and limited community ambulation with LRAD (11/03/16)   Baseline 2.75 ft/sec with quad cane on 6/22   Time 8   Period Weeks   Status Revised               Plan - 10/10/16 1713    Clinical Impression Statement Today's skilled session focused on navigating ramps, curbs, stairs, and level surfaces with SBQC as well as static, and dynamic balance activities. Patient demonstrated improved endurance completing 3 laps up and down a flight of 4 stairs before needing a rest break. Noted improved static balance on compliant surfaces. One incidence of moderate LOB as he rolled his right foot off of the blue foam beam attempting up and down head movements. Discussed and recommended the use of an ASO on R foot when pt. is walking and doing activities outside of the house to provide stability for R ankle on uneven surfaces. Also noted improved ability to self correct posture during high level balance activities such as high knee marching. However, consistent VC's required  to slow down and weight shift to assist balance in SLS. Pt. somewhat impulsive, attempting to perform alternating SLS without adequate weight shift before lightening UE support. Patient is steadily progressing towards goals and will benefit from continued PT to meet unmet goals.   Rehab Potential Good   Clinical Impairments Affecting Rehab Potential multiple comorbidities   PT Frequency 2x / week   PT Duration 8 weeks   PT Treatment/Interventions ADLs/Self Care Home Management;Cryotherapy;Electrical Stimulation;Neuromuscular re-education;Balance training;Therapeutic exercise;Therapeutic activities;Functional mobility training;Gait training;Stair training;Patient/family education;Orthotic Fit/Training;Manual techniques;Vestibular;Energy conservation;Moist Heat;DME Instruction   PT Next Visit Plan Continue with focus on static and dynamic balance activities, including stepping over and around obstacles, reciprocal stepping on stairs, SLS, and endurance with walking on level and unlevel surfaces.   Consulted and Agree with Plan of Care Patient;Family member/caregiver   Family Member Consulted wife      Patient will benefit from skilled therapeutic intervention in order to improve the following deficits and impairments:  Abnormal gait, Decreased balance, Decreased activity tolerance, Decreased strength, Postural dysfunction, Obesity, Decreased endurance, Decreased knowledge of use of DME, Decreased mobility, Difficulty walking, Dizziness, Decreased coordination, Impaired sensation, Impaired vision/preception  Visit Diagnosis: Hemiplegia and hemiparesis following cerebral infarction affecting left non-dominant side (HCC)  Other abnormalities of gait and mobility  Muscle weakness (generalized)  Unsteadiness on feet     Problem List Patient Active Problem List   Diagnosis Date Noted  . Benign essential HTN   . 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 Embolic cerebral infarction ([place]) 06/21/2016  .  Stroke syndrome ([place])   . OSA (obstructive sleep apnea)   . Dysphagia, post-stroke   . Ataxia, post-stroke   . Neurologic gait disorder   . Encounter for monitoring flecainide therapy 04/28/2016  . Pure hyperglyceridemia 04/26/2016  . Recurrent pulmonary emboli ([place]) 06/13/2015  . GERD (gastroesophageal reflux disease) 06/13/2015  . Asthma 06/13/2015  . Basal cell carcinoma of skin 06/28/2014  . 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 Sinus bradycardia 12/15/2013  .  PAF (paroxysmal atrial fibrillation) ([place]) 07/29/2013  . Chronic anticoagulation 07/22/2013  . Gastric ulcer with hemorrhage 07/20/13 07/22/2013  . Diastolic congestive heart failure ([place]) 07/16/2013  . H/O laparoscopic adjustable gastric banding & hiatal hernia repair 04/08/13 04/23/2013  . Prediabetes 02/05/2013  . TMJ (temporomandibular joint syndrome) 09/11/2012  . Morbid obesity ([place]) 08/09/2012  . Peripheral neuropathy ([place]) 11/02/2009  . Hyperlipidemia with target LDL less than 130 09/10/2008  . Depression 09/10/2008  . GOUT 06/28/2006  . BPH (benign prostatic hyperplasia) 06/28/2006    Vaughan Sine 10/10/2016, 5:25 PM  [facility] [address] [place], [place], [address] Phone: [phone]   Fax:  [phone]  Name: JEFFERY BACHMEIER MRN: [id number] Date of Birth: 12-29-1945

## 2016-10-11 ENCOUNTER — Ambulatory Visit: Payer: Medicare Other | Admitting: Physical Therapy

## 2016-10-11 ENCOUNTER — Telehealth: Payer: Self-pay | Admitting: Student

## 2016-10-11 ENCOUNTER — Encounter: Payer: Self-pay | Admitting: Physical Therapy

## 2016-10-11 DIAGNOSIS — R2689 Other abnormalities of gait and mobility: Secondary | ICD-10-CM

## 2016-10-11 DIAGNOSIS — I69354 Hemiplegia and hemiparesis following cerebral infarction affecting left non-dominant side: Secondary | ICD-10-CM

## 2016-10-11 DIAGNOSIS — R2681 Unsteadiness on feet: Secondary | ICD-10-CM

## 2016-10-11 DIAGNOSIS — M6281 Muscle weakness (generalized): Secondary | ICD-10-CM

## 2016-10-11 NOTE — Telephone Encounter (Signed)
Routed to B. Ahmed Prima, PA & [place], LPN

## 2016-10-11 NOTE — Telephone Encounter (Signed)
New message     Would like [place] to call him 10/12/16 about the medication they discussed

## 2016-10-12 NOTE — Telephone Encounter (Signed)
SAMPLES AT THE FRONT DESK PT NOTIFIED HE WILL COME AND P/U

## 2016-10-12 NOTE — Therapy (Signed)
[facility] [address] [place], [place], [address] Phone: [phone]   Fax:  [phone]  Physical Therapy Treatment  Patient Details  Name: [name] MRN: [id number] Date of Birth: 1945-08-14 Referring Provider: Dr. Alysia Penna  Encounter Date: 10/11/2016      PT End of Session - 10/11/16 1410    Visit Number 26   Number of Visits 34   Date for PT Re-Evaluation 11/03/16   Authorization Type Medicare-G Codes and Progress Notes every 10th visit   PT Start Time 1404   PT Stop Time 1445   PT Time Calculation (min) 41 min   Equipment Utilized During Treatment Gait belt   Activity Tolerance Patient tolerated treatment well   Behavior During Therapy [facility] for tasks assessed/performed      Past Medical History:  Diagnosis Date  . Anemia 07/29/2013  . Asthma    childhood asthma  . Benign prostatic hypertrophy    several prostate biopsies neg for cancer.   . Bilateral hip pain   . Cancer ([place])   . CHF (congestive heart failure) ([place])   . Complication of anesthesia    MORPHINE CAUSES HALLUCINATIONS  . CVA (cerebral vascular accident) ([place]) 06/2016  . Depression   . FH: colonic polyps 2010  . Gout   . History of kidney stones   . Hypertension   . Kidney cysts   . Morbid obesity ([place])   . Numbness    feet / legs  . PAF (paroxysmal atrial fibrillation) with RVR 07/29/2013  . Sleep apnea    USES C PAP    Past Surgical History:  Procedure Laterality Date  . ARTHROSCOPY KNEE W/ DRILLING    . BREATH TEK H PYLORI N/A 12/23/2012   Procedure: BREATH TEK H PYLORI;  Surgeon: Gayland Curry, MD;  Location: [facility];  Service: General;  Laterality: N/A;  . CHOLECYSTECTOMY  1989  . COLONOSCOPY N/A 07/31/2013   Procedure: COLONOSCOPY;  Surgeon: Gatha Mayer, MD;  Location: [place];  Service: Endoscopy;  Laterality: N/A;  . ESOPHAGOGASTRODUODENOSCOPY N/A 07/22/2013   Procedure:  ESOPHAGOGASTRODUODENOSCOPY (EGD);  Surgeon: Irene Shipper, MD;  [facility];  Service: Endoscopy;  Laterality: N/A;  . ESOPHAGOGASTRODUODENOSCOPY N/A 07/31/2013   Procedure: ESOPHAGOGASTRODUODENOSCOPY (EGD);  Surgeon: Gatha Mayer, MD;  [facility];  Service: Endoscopy;  Laterality: N/A;  . INTRAOPERATIVE TRANSESOPHAGEAL ECHOCARDIOGRAM N/A 07/18/2013   Procedure: INTRAOPERATIVE TRANSESOPHAGEAL ECHOCARDIOGRAM;  Surgeon: Grace Isaac, MD;  Location: [place];  Service: Open Heart Surgery;  Laterality: N/A;  . LAPAROSCOPIC GASTRIC BANDING WITH HIATAL HERNIA REPAIR N/A 04/08/2013   Procedure: LAPAROSCOPIC GASTRIC BANDING WITH possible  HIATAL HERNIA REPAIR;  Surgeon: Gayland Curry, MD;  Location: [facility];  Service: General;  Laterality: N/A;  . LITHOTRIPSY    . renal calculi    . SUBXYPHOID PERICARDIAL WINDOW N/A 07/18/2013   Procedure: SUBXYPHOID PERICARDIAL WINDOW;  Surgeon: Grace Isaac, MD;  Location: [place];  Service: Thoracic;  Laterality: N/A;  . TONSILLECTOMY    . total knee replacment     bilat  . trigger finger repair     also lue, cts surgically repaired  . WISDOM TOOTH EXTRACTION      There were no vitals filed for this visit.      Subjective Assessment - 10/11/16 1407    Subjective No new complaints. No falls to report. Left eye is hurting, has been for a few days. Discribes it as an  ache that fatigues the eye and causes it to drift. Reading makes it worse, however occurs without reading as well.    Patient is accompained by: Family member   Pertinent History asthma, basal cell carcinoma, obesity, gout, depression, HTN, OSA, peripheral neuropathy, CHF, PAF, PE, bil TKA   Limitations Walking   Patient Stated Goals improve walking, balance, wants to drive again and be independent   Currently in Pain? No/denies   Pain Score 0-No pain              [facility] Adult PT Treatment/Exercise - 10/11/16 1411      Transfers   Transfers Sit to Stand;Stand to  Sit   Sit to Stand 5: Supervision;With upper extremity assist;From chair/3-in-1;From bed   Stand to Sit 5: Supervision;With upper extremity assist;To chair/3-in-1;To bed     Ambulation/Gait   Ambulation/Gait Yes   Ambulation/Gait Assistance 5: Supervision   Ambulation/Gait Assistance Details cues on posture and cane placement with gait.    Ambulation Distance (Feet) 450 Feet  x1,    Assistive device Small based quad cane   Gait Pattern Decreased stance time - right;Decreased stance time - left;Step-through pattern;Wide base of support;Decreased step length - right;Decreased step length - left;Decreased arm swing - left   Ambulation Surface Level;Indoor             Balance Exercises - 10/11/16 1421      Balance Exercises: Standing   Standing Eyes Opened Narrow base of support (BOS);Head turns;Foam/compliant surface;Other reps (comment);Limitations   Standing Eyes Closed Foam/compliant surface;Head turns;Narrow base of support (BOS);20 secs;Limitations   Wall Bumps Hip   Wall Bumps-Hips Eyes opened;Anterior/posterior;10 reps;Limitations   Balance Beam standing across blue foam beam with wall behind pt and chair in front of pt for safety: alternating fwd stepping to floor and back onto beam x 10 reps each leg with min<>mod assist for balance; alternating fwd stepping to floor and back onto beam x 6-7 reps each side, stopped due to fatigue in LE's. min to mod assist for balance. cues needed for large step and increased step height to clear beam surface.                          Balance Exercises: Standing   Standing Eyes Opened Limitations on airex in corner with narrow base of support: head movements left<>right, up<>down and diagonals both ways x 10 reps each with min guard to min assist for balance.    Standing Eyes Closed Limitations on airex in corner with narrow base of support: no head movements, progressing to head movements (see above), progressing to head movements left<>right,  up<>down x 10 reps each. min to mod assist with head movments, otherwise minguard to min assist needed for balance. cues on posture and weight shifting for balance recovery as well.    Wall Bumps Limitations with feet apart on airex x 10 reps with cues on ex form/posture; with feet across blue foam beam: x 10 reps with cues on technique and weight shifting.              PT Short Term Goals - 08/08/16 1452      PT SHORT TERM GOAL #1   Title verbalize understanding of CVA risk factors/warning signs (08/10/16)   Status Achieved     PT SHORT TERM GOAL #2   Title improve timed up and go to < 30 sec for improved mobility (08/10/16)   Status Achieved  PT SHORT TERM GOAL #3   Title improve gait velocity to > 1.32 ft/sec for improved mobility and community access (08/10/16)   Status Achieved     PT SHORT TERM GOAL #4   Title amb > 300' on level indoor/paved outdoor surfaces with LRAD with supervision for improved community access (08/10/16)   Status Achieved     PT SHORT TERM GOAL #5   Title perform BERG with appropriate LTG to be written (08/10/16)   Status Achieved           PT Long Term Goals - 09/22/16 1640      PT LONG TERM GOAL #1   Title independent with HEP (NEW TARGET DATE OF 11/03/2016)   Time 8   Period Weeks   Status On-going     PT LONG TERM GOAL #4   Title amb > 500' on various indoor/outdoor surfaces and will negotiate up/down 7 stairs with cane and rail with supervision for improved independence and mobility (11/03/16)   Baseline 500' on outdoor pavement (uneven) with quad cane and min A; rollator with supervision   Time 8   Period Weeks   Status Revised     PT LONG TERM GOAL #5   Title Pt will decrease falls risk as indicated by an increase in BERG balance score to > or = 45/56 (11/03/16)   Baseline 42/56 on 09/22/16   Time 8   Period Weeks   Status Revised     PT LONG TERM GOAL #6   Title Pt will improve gait velocity with quad cane to > or = 2.86ft/sec for  decreased falls risk during household and limited community ambulation with LRAD (11/03/16)   Baseline 2.75 ft/sec with quad cane on 6/22   Time 8   Period Weeks   Status Revised               Plan - 10/11/16 1410    Clinical Impression Statement Today's skilled session continued to address gait with small based quad cane and high level balance. Pt was challenged with all balance activities, needing increased assistance for balance. Pt is progressing toward goals and should benefit from continued PT to progress toward unmet goals.                           Rehab Potential Good   Clinical Impairments Affecting Rehab Potential multiple comorbidities   PT Frequency 2x / week   PT Duration 8 weeks   PT Treatment/Interventions ADLs/Self Care Home Management;Cryotherapy;Electrical Stimulation;Neuromuscular re-education;Balance training;Therapeutic exercise;Therapeutic activities;Functional mobility training;Gait training;Stair training;Patient/family education;Orthotic Fit/Training;Manual techniques;Vestibular;Energy conservation;Moist Heat;DME Instruction   PT Next Visit Plan Continue with focus on static and dynamic balance activities, including stepping over and around obstacles, reciprocal stepping on stairs, SLS, and endurance with walking on level and unlevel surfaces.   Consulted and Agree with Plan of Care Patient;Family member/caregiver   Family Member Consulted wife      Patient will benefit from skilled therapeutic intervention in order to improve the following deficits and impairments:  Abnormal gait, Decreased balance, Decreased activity tolerance, Decreased strength, Postural dysfunction, Obesity, Decreased endurance, Decreased knowledge of use of DME, Decreased mobility, Difficulty walking, Dizziness, Decreased coordination, Impaired sensation, Impaired vision/preception  Visit Diagnosis: Hemiplegia and hemiparesis following cerebral infarction affecting left non-dominant side  (HCC)  Other abnormalities of gait and mobility  Unsteadiness on feet  Muscle weakness (generalized)     Problem List Patient Active Problem List  Diagnosis Date Noted  . Benign essential HTN   . 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 Embolic cerebral infarction ([place]) 06/21/2016  . Stroke syndrome ([place])   . OSA (obstructive sleep apnea)   . Dysphagia, post-stroke   . Ataxia, post-stroke   . Neurologic gait disorder   . Encounter for monitoring flecainide therapy 04/28/2016  . Pure hyperglyceridemia 04/26/2016  . Recurrent pulmonary emboli ([place]) 06/13/2015  . GERD (gastroesophageal reflux disease) 06/13/2015  . Asthma 06/13/2015  . Basal cell carcinoma of skin 06/28/2014  . 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 Sinus bradycardia 12/15/2013  . PAF (paroxysmal atrial fibrillation) ([place]) 07/29/2013  . Chronic anticoagulation 07/22/2013  . Gastric ulcer with hemorrhage 07/20/13 07/22/2013  . Diastolic congestive heart failure ([place]) 07/16/2013  . H/O laparoscopic adjustable gastric banding & hiatal hernia repair 04/08/13 04/23/2013  . Prediabetes 02/05/2013  . TMJ (temporomandibular joint syndrome) 09/11/2012  . Morbid obesity ([place]) 08/09/2012  . Peripheral neuropathy ([place]) 11/02/2009  . Hyperlipidemia with target LDL less than 130 09/10/2008  . Depression 09/10/2008  . GOUT 06/28/2006  . BPH (benign prostatic hyperplasia) 06/28/2006    Willow Ora, PTA, [facility] [address] [place], [place] [address] [phone] 10/12/16, 4:07 PM   Name: ALEKZANDER CARDELL MRN: [id number] Date of Birth: 05-Oct-1945

## 2016-10-14 ENCOUNTER — Other Ambulatory Visit: Payer: Self-pay | Admitting: Internal Medicine

## 2016-10-16 ENCOUNTER — Encounter: Payer: Self-pay | Admitting: Physical Therapy

## 2016-10-16 ENCOUNTER — Ambulatory Visit: Payer: Medicare Other | Admitting: Physical Therapy

## 2016-10-16 DIAGNOSIS — M6281 Muscle weakness (generalized): Secondary | ICD-10-CM | POA: Diagnosis not present

## 2016-10-16 DIAGNOSIS — R2689 Other abnormalities of gait and mobility: Secondary | ICD-10-CM

## 2016-10-16 DIAGNOSIS — R2681 Unsteadiness on feet: Secondary | ICD-10-CM

## 2016-10-16 DIAGNOSIS — I69354 Hemiplegia and hemiparesis following cerebral infarction affecting left non-dominant side: Secondary | ICD-10-CM

## 2016-10-16 NOTE — Therapy (Signed)
[facility] [address] [place], [place], [address] Phone: [phone]   Fax:  [phone]  Physical Therapy Treatment  Patient Details  Name: [name] MRN: [id number] Date of Birth: Mar 10, 1946 Referring Provider: Dr. Alysia Penna  Encounter Date: 10/16/2016      PT End of Session - 10/16/16 1449    Visit Number 27   Number of Visits 34   Date for PT Re-Evaluation 11/03/16   Authorization Type Medicare-G Codes and Progress Notes every 10th visit   PT Start Time 1402   PT Stop Time 1443   PT Time Calculation (min) 41 min   Equipment Utilized During Treatment Gait belt   Activity Tolerance Patient tolerated treatment well   Behavior During Therapy [facility] for tasks assessed/performed      Past Medical History:  Diagnosis Date  . Anemia 07/29/2013  . Asthma    childhood asthma  . Benign prostatic hypertrophy    several prostate biopsies neg for cancer.   . Bilateral hip pain   . Cancer ([place])   . CHF (congestive heart failure) ([place])   . Complication of anesthesia    MORPHINE CAUSES HALLUCINATIONS  . CVA (cerebral vascular accident) ([place]) 06/2016  . Depression   . FH: colonic polyps 2010  . Gout   . History of kidney stones   . Hypertension   . Kidney cysts   . Morbid obesity ([place])   . Numbness    feet / legs  . PAF (paroxysmal atrial fibrillation) with RVR 07/29/2013  . Sleep apnea    USES C PAP    Past Surgical History:  Procedure Laterality Date  . ARTHROSCOPY KNEE W/ DRILLING    . BREATH TEK H PYLORI N/A 12/23/2012   Procedure: BREATH TEK H PYLORI;  Surgeon: Gayland Curry, MD;  Location: [facility];  Service: General;  Laterality: N/A;  . CHOLECYSTECTOMY  1989  . COLONOSCOPY N/A 07/31/2013   Procedure: COLONOSCOPY;  Surgeon: Gatha Mayer, MD;  Location: [place];  Service: Endoscopy;  Laterality: N/A;  . ESOPHAGOGASTRODUODENOSCOPY N/A 07/22/2013   Procedure:  ESOPHAGOGASTRODUODENOSCOPY (EGD);  Surgeon: Irene Shipper, MD;  [facility];  Service: Endoscopy;  Laterality: N/A;  . ESOPHAGOGASTRODUODENOSCOPY N/A 07/31/2013   Procedure: ESOPHAGOGASTRODUODENOSCOPY (EGD);  Surgeon: Gatha Mayer, MD;  Location: [facility];  Service: Endoscopy;  Laterality: N/A;  . INTRAOPERATIVE TRANSESOPHAGEAL ECHOCARDIOGRAM N/A 07/18/2013   Procedure: INTRAOPERATIVE TRANSESOPHAGEAL ECHOCARDIOGRAM;  Surgeon: Grace Isaac, MD;  Location: [place];  Service: Open Heart Surgery;  Laterality: N/A;  . LAPAROSCOPIC GASTRIC BANDING WITH HIATAL HERNIA REPAIR N/A 04/08/2013   Procedure: LAPAROSCOPIC GASTRIC BANDING WITH possible  HIATAL HERNIA REPAIR;  Surgeon: Gayland Curry, MD;  [facility];  Service: General;  Laterality: N/A;  . LITHOTRIPSY    . renal calculi    . SUBXYPHOID PERICARDIAL WINDOW N/A 07/18/2013   Procedure: SUBXYPHOID PERICARDIAL WINDOW;  Surgeon: Grace Isaac, MD;  Location: [place];  Service: Thoracic;  Laterality: N/A;  . TONSILLECTOMY    . total knee replacment     bilat  . trigger finger repair     also lue, cts surgically repaired  . WISDOM TOOTH EXTRACTION      There were no vitals filed for this visit.      Subjective Assessment - 10/16/16 1418    Subjective Patient wore new ankle brace to today's session and said he felt more confident walking with it on. He stated that  he is continuing to read at home he just has to take breaks to rest his left eye.    Patient is accompained by: Family member   Currently in Pain? No/denies            [facility] Adult PT Treatment/Exercise - 10/16/16 1500      Ambulation/Gait   Ambulation/Gait Yes   Ambulation/Gait Assistance 5: Supervision;4: Min guard   Ambulation Distance (Feet) 550 Feet  inside and outside    Assistive device Small based quad cane   Gait Pattern Decreased stance time - right;Decreased stance time - left;Step-through pattern;Wide base of support;Decreased step length -  right;Decreased step length - left;Decreased arm swing - left   Ambulation Surface Level;Unlevel;Indoor;Outdoor;Paved;Grass     High Level Balance   High Level Balance Activities Side stepping;Backward walking;Negotiating over obstacles;Marching forwards   High Level Balance Comments at counter, marching over hurdles with min guard and UE support on counter, marching over small orange hurdles one foot per space 2 laps, min guard to min assist, lateral stepping 2 laps with bil UE support progression to no UE support, backward walking 2 laps with decreased speed was able to complete without UE support, min guard,      Neuro Re-ed    Neuro Re-ed Details  in corner with chair in front for safety, on airex mat EO static balance 2x30sec, head turns left <> right, up <> down, diagonals 10x each, min guard to min assist with notable ankle strategies; fwd stepping onto airex and back off 10x with LOB and use of wall or cane for support, min assist with cues to step higher and to slow down            PT Short Term Goals - 08/08/16 1452      PT SHORT TERM GOAL #1   Title verbalize understanding of CVA risk factors/warning signs (08/10/16)   Status Achieved     PT SHORT TERM GOAL #2   Title improve timed up and go to < 30 sec for improved mobility (08/10/16)   Status Achieved     PT SHORT TERM GOAL #3   Title improve gait velocity to > 1.32 ft/sec for improved mobility and community access (08/10/16)   Status Achieved     PT SHORT TERM GOAL #4   Title amb > 300' on level indoor/paved outdoor surfaces with LRAD with supervision for improved community access (08/10/16)   Status Achieved     PT SHORT TERM GOAL #5   Title perform BERG with appropriate LTG to be written (08/10/16)   Status Achieved           PT Long Term Goals - 09/22/16 1640      PT LONG TERM GOAL #1   Title independent with HEP (NEW TARGET DATE OF 11/03/2016)   Time 8   Period Weeks   Status On-going     PT LONG TERM  GOAL #4   Title amb > 500' on various indoor/outdoor surfaces and will negotiate up/down 7 stairs with cane and rail with supervision for improved independence and mobility (11/03/16)   Baseline 500' on outdoor pavement (uneven) with quad cane and min A; rollator with supervision   Time 8   Period Weeks   Status Revised     PT LONG TERM GOAL #5   Title Pt will decrease falls risk as indicated by an increase in BERG balance score to > or = 45/56 (11/03/16)   Baseline 42/56 on 09/22/16  Time 8   Period Weeks   Status Revised     PT LONG TERM GOAL #6   Title Pt will improve gait velocity with quad cane to > or = 2.27ft/sec for decreased falls risk during household and limited community ambulation with LRAD (11/03/16)   Baseline 2.75 ft/sec with quad cane on 6/22   Time 8   Period Weeks   Status Revised           Plan - 10/16/16 1451    Clinical Impression Statement Today's session continued to focus on gait on both level and unlevel surfaces and balance exercises. Patient was able to increase distance walked outside on the grass before becoming SOB. He did need breaks throughout the session to rest and catch his breath. Patient was able to complete balance exercises at counter when slowed down and with intermittent UE support. He continues to progress and would benefit from conintued PT to address unmet goals.                                                         Clinical Impairments Affecting Rehab Potential multiple comorbidities   PT Frequency 2x / week   PT Duration 8 weeks   PT Treatment/Interventions ADLs/Self Care Home Management;Cryotherapy;Electrical Stimulation;Neuromuscular re-education;Balance training;Therapeutic exercise;Therapeutic activities;Functional mobility training;Gait training;Stair training;Patient/family education;Orthotic Fit/Training;Manual techniques;Vestibular;Energy conservation;Moist Heat;DME Instruction   PT Next Visit Plan Continue with focus on static and  dynamic balance activities, including stepping over and around obstacles, reciprocal stepping on stairs, SLS, and endurance with walking on level and unlevel surfaces.   Consulted and Agree with Plan of Care Patient;Family member/caregiver   Family Member Consulted wife      Patient will benefit from skilled therapeutic intervention in order to improve the following deficits and impairments:  Abnormal gait, Decreased balance, Decreased activity tolerance, Decreased strength, Postural dysfunction, Obesity, Decreased endurance, Decreased knowledge of use of DME, Decreased mobility, Difficulty walking, Dizziness, Decreased coordination, Impaired sensation, Impaired vision/preception  Visit Diagnosis: Hemiplegia and hemiparesis following cerebral infarction affecting left non-dominant side (HCC)  Other abnormalities of gait and mobility  Unsteadiness on feet  Muscle weakness (generalized)     Problem List Patient Active Problem List   Diagnosis Date Noted  . Benign essential HTN   . 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 Embolic cerebral infarction ([place]) 06/21/2016  . Stroke syndrome ([place])   . OSA (obstructive sleep apnea)   . Dysphagia, post-stroke   . Ataxia, post-stroke   . Neurologic gait disorder   . Encounter for monitoring flecainide therapy 04/28/2016  . Pure hyperglyceridemia 04/26/2016  . Recurrent pulmonary emboli ([place]) 06/13/2015  . GERD (gastroesophageal reflux disease) 06/13/2015  . Asthma 06/13/2015  . Basal cell carcinoma of skin 06/28/2014  . 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 Sinus bradycardia 12/15/2013  . PAF (paroxysmal atrial fibrillation) ([place]) 07/29/2013  . Chronic anticoagulation 07/22/2013  . Gastric ulcer with hemorrhage 07/20/13 07/22/2013  . Diastolic congestive heart failure ([place]) 07/16/2013  . H/O laparoscopic adjustable gastric banding & hiatal hernia repair 04/08/13 04/23/2013  . Prediabetes 02/05/2013  . TMJ (temporomandibular joint  syndrome) 09/11/2012  . Morbid obesity ([place]) 08/09/2012  . Peripheral neuropathy ([place]) 11/02/2009  . Hyperlipidemia with target LDL less than 130 09/10/2008  . Depression 09/10/2008  . GOUT 06/28/2006  . BPH (benign  prostatic hyperplasia) 06/28/2006    Skip Estimable, SPTA 10/16/2016, 3:18 PM  [facility] [address] [place], [place], [address] Phone: [phone]   Fax:  [phone]  Name: [name] MRN: [id number] Date of Birth: September 05, 1945

## 2016-10-20 ENCOUNTER — Ambulatory Visit: Payer: Medicare Other | Admitting: Physical Therapy

## 2016-10-20 ENCOUNTER — Encounter: Payer: Self-pay | Admitting: Physical Therapy

## 2016-10-20 DIAGNOSIS — R2689 Other abnormalities of gait and mobility: Secondary | ICD-10-CM | POA: Diagnosis not present

## 2016-10-20 DIAGNOSIS — M6281 Muscle weakness (generalized): Secondary | ICD-10-CM

## 2016-10-20 DIAGNOSIS — R2681 Unsteadiness on feet: Secondary | ICD-10-CM

## 2016-10-20 DIAGNOSIS — I69354 Hemiplegia and hemiparesis following cerebral infarction affecting left non-dominant side: Secondary | ICD-10-CM | POA: Diagnosis not present

## 2016-10-21 NOTE — Therapy (Signed)
[facility] [address] [place], [place], [address] Phone: [phone]   Fax:  [phone]  Physical Therapy Treatment  Patient Details  Name: [name] MRN: [id number] Date of Birth: 07-Jan-1946 Referring Provider: Dr. Alysia Penna  Encounter Date: 10/20/2016      PT End of Session - 10/21/16 1325    Visit Number 28   Number of Visits 34   Date for PT Re-Evaluation 11/03/16   Authorization Type Medicare-G Codes and Progress Notes every 10th visit   PT Start Time 1405   PT Stop Time 1445   PT Time Calculation (min) 40 min   Equipment Utilized During Treatment Gait belt   Activity Tolerance Patient tolerated treatment well   Behavior During Therapy [facility] for tasks assessed/performed      Past Medical History:  Diagnosis Date  . Anemia 07/29/2013  . Asthma    childhood asthma  . Benign prostatic hypertrophy    several prostate biopsies neg for cancer.   . Bilateral hip pain   . Cancer ([place])   . CHF (congestive heart failure) ([place])   . Complication of anesthesia    MORPHINE CAUSES HALLUCINATIONS  . CVA (cerebral vascular accident) ([place]) 06/2016  . Depression   . FH: colonic polyps 2010  . Gout   . History of kidney stones   . Hypertension   . Kidney cysts   . Morbid obesity ([place])   . Numbness    feet / legs  . PAF (paroxysmal atrial fibrillation) with RVR 07/29/2013  . Sleep apnea    USES C PAP    Past Surgical History:  Procedure Laterality Date  . ARTHROSCOPY KNEE W/ DRILLING    . BREATH TEK H PYLORI N/A 12/23/2012   Procedure: BREATH TEK H PYLORI;  Surgeon: Gayland Curry, MD;  Location: [facility];  Service: General;  Laterality: N/A;  . CHOLECYSTECTOMY  1989  . COLONOSCOPY N/A 07/31/2013   Procedure: COLONOSCOPY;  Surgeon: Gatha Mayer, MD;  Location: [facility];  Service: Endoscopy;  Laterality: N/A;  . ESOPHAGOGASTRODUODENOSCOPY N/A 07/22/2013   Procedure:  ESOPHAGOGASTRODUODENOSCOPY (EGD);  Surgeon: Irene Shipper, MD;  Location: [facility] ENDOSCOPY;  Service: Endoscopy;  Laterality: N/A;  . ESOPHAGOGASTRODUODENOSCOPY N/A 07/31/2013   Procedure: ESOPHAGOGASTRODUODENOSCOPY (EGD);  Surgeon: Gatha Mayer, MD;  Location: [facility] ENDOSCOPY;  Service: Endoscopy;  Laterality: N/A;  . INTRAOPERATIVE TRANSESOPHAGEAL ECHOCARDIOGRAM N/A 07/18/2013   Procedure: INTRAOPERATIVE TRANSESOPHAGEAL ECHOCARDIOGRAM;  Surgeon: Grace Isaac, MD;  Location: [place] [facility];  Service: Open Heart Surgery;  Laterality: N/A;  . LAPAROSCOPIC GASTRIC BANDING WITH HIATAL HERNIA REPAIR N/A 04/08/2013   Procedure: LAPAROSCOPIC GASTRIC BANDING WITH possible  HIATAL HERNIA REPAIR;  Surgeon: Gayland Curry, MD;  Location: [facility];  Service: General;  Laterality: N/A;  . LITHOTRIPSY    . renal calculi    . SUBXYPHOID PERICARDIAL WINDOW N/A 07/18/2013   Procedure: SUBXYPHOID PERICARDIAL WINDOW;  Surgeon: Grace Isaac, MD;  Location: [facility];  Service: Thoracic;  Laterality: N/A;  . TONSILLECTOMY    . total knee replacment     bilat  . trigger finger repair     also lue, cts surgically repaired  . WISDOM TOOTH EXTRACTION      There were no vitals filed for this visit.      Subjective Assessment - 10/21/16 1314    Subjective No new changes to report. Wearing ankle brace.   Patient is accompained by: Family member   Patient Stated Goals  improve walking, balance, wants to drive again and be independent   Currently in Pain? No/denies                         [facility] Adult PT Treatment/Exercise - 10/21/16 0001      Ambulation/Gait   Ambulation/Gait Yes   Ambulation/Gait Assistance 5: Supervision;4: Min guard   Ambulation/Gait Assistance Details vc for incr weight shift over LLE to allow better step with RLE   Ambulation Distance (Feet) 240 Feet  100   Assistive device Small based quad cane   Gait Pattern Decreased stance time - left;Step-through pattern;Wide base of  support;Decreased step length - right;Decreased step length - left;Decreased arm swing - left;Decreased weight shift to left   Ambulation Surface Level   Ramp 5: Supervision   Ramp Details (indicate cue type and reason) up/down no cues needed; added blue mat with supervision; static stance on mat on ramp with EO, EC, marching, head turns     High Level Balance   High Level Balance Activities Side stepping;Backward walking;Marching forwards   High Level Balance Comments in // bars, intermittent UE assist     Knee/Hip Exercises: Aerobic   Nustep L5 8 minutes; UEs and LEs             Balance Exercises - 10/21/16 1320      Balance Exercises: Standing   Step Ups Forward;6 inch;Intermittent UE support   Balance Beam blue beam horizontal; EO; step on/off anterior and posterior intermittent UE support with difficulty clearing feet and maintaining balance; also static stance with head turns horizontal and vertical   Marching Limitations on ramp on blue mat           PT Education - 10/21/16 1328    Education provided Yes   Education Details Discussed post-therapy activity plan and provided information re: [employer]) Educated Patient;Spouse   Methods Explanation;Demonstration;Handout   Comprehension Verbalized understanding          PT Short Term Goals - 08/08/16 1452      PT SHORT TERM GOAL #1   Title verbalize understanding of CVA risk factors/warning signs (08/10/16)   Status Achieved     PT SHORT TERM GOAL #2   Title improve timed up and go to < 30 sec for improved mobility (08/10/16)   Status Achieved     PT SHORT TERM GOAL #3   Title improve gait velocity to > 1.32 ft/sec for improved mobility and community access (08/10/16)   Status Achieved     PT SHORT TERM GOAL #4   Title amb > 300' on level indoor/paved outdoor surfaces with LRAD with supervision for improved community access (08/10/16)   Status Achieved     PT SHORT TERM GOAL #5   Title  perform BERG with appropriate LTG to be written (08/10/16)   Status Achieved           PT Long Term Goals - 09/22/16 1640      PT LONG TERM GOAL #1   Title independent with HEP (NEW TARGET DATE OF 11/03/2016)   Time 8   Period Weeks   Status On-going     PT LONG TERM GOAL #4   Title amb > 500' on various indoor/outdoor surfaces and will negotiate up/down 7 stairs with cane and rail with supervision for improved independence and mobility (11/03/16)   Baseline 500' on outdoor pavement (uneven) with quad cane and min A; rollator  with supervision   Time 8   Period Weeks   Status Revised     PT LONG TERM GOAL #5   Title Pt will decrease falls risk as indicated by an increase in BERG balance score to > or = 45/56 (11/03/16)   Baseline 42/56 on 09/22/16   Time 8   Period Weeks   Status Revised     PT LONG TERM GOAL #6   Title Pt will improve gait velocity with quad cane to > or = 2.34ft/sec for decreased falls risk during household and limited community ambulation with LRAD (11/03/16)   Baseline 2.75 ft/sec with quad cane on 6/22   Time 8   Period Weeks   Status Revised               Plan - 10/21/16 1326    Clinical Impression Statement Sesssion focused on strengthening, balance and gait training. Continues to require rest breaks due to either back pain or dyspnea. Pt highly motivated and will continue to benefit from PT.    Clinical Impairments Affecting Rehab Potential multiple comorbidities   PT Frequency 2x / week   PT Duration 8 weeks   PT Treatment/Interventions ADLs/Self Care Home Management;Cryotherapy;Electrical Stimulation;Neuromuscular re-education;Balance training;Therapeutic exercise;Therapeutic activities;Functional mobility training;Gait training;Stair training;Patient/family education;Orthotic Fit/Training;Manual techniques;Vestibular;Energy conservation;Moist Heat;DME Instruction   PT Next Visit Plan Continue with focus on static and dynamic balance activities,  including stepping over and around obstacles, reciprocal stepping on stairs, SLS, and endurance with walking on level and unlevel surfaces.   Consulted and Agree with Plan of Care Patient;Family member/caregiver   Family Member Consulted wife      Patient will benefit from skilled therapeutic intervention in order to improve the following deficits and impairments:  Abnormal gait, Decreased balance, Decreased activity tolerance, Decreased strength, Postural dysfunction, Obesity, Decreased endurance, Decreased knowledge of use of DME, Decreased mobility, Difficulty walking, Dizziness, Decreased coordination, Impaired sensation, Impaired vision/preception  Visit Diagnosis: Other abnormalities of gait and mobility  Unsteadiness on feet  Muscle weakness (generalized)     Problem List Patient Active Problem List   Diagnosis Date Noted  . Benign essential HTN   . 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 Embolic cerebral infarction ([place]) 06/21/2016  . Stroke syndrome ([place])   . OSA (obstructive sleep apnea)   . Dysphagia, post-stroke   . Ataxia, post-stroke   . Neurologic gait disorder   . Encounter for monitoring flecainide therapy 04/28/2016  . Pure hyperglyceridemia 04/26/2016  . Recurrent pulmonary emboli ([place]) 06/13/2015  . GERD (gastroesophageal reflux disease) 06/13/2015  . Asthma 06/13/2015  . Basal cell carcinoma of skin 06/28/2014  . 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 Sinus bradycardia 12/15/2013  . PAF (paroxysmal atrial fibrillation) ([place]) 07/29/2013  . Chronic anticoagulation 07/22/2013  . Gastric ulcer with hemorrhage 07/20/13 07/22/2013  . Diastolic congestive heart failure ([place]) 07/16/2013  . H/O laparoscopic adjustable gastric banding & hiatal hernia repair 04/08/13 04/23/2013  . Prediabetes 02/05/2013  . TMJ (temporomandibular joint syndrome) 09/11/2012  . Morbid obesity ([place]) 08/09/2012  . Peripheral neuropathy ([place]) 11/02/2009  . Hyperlipidemia with  target LDL less than 130 09/10/2008  . Depression 09/10/2008  . GOUT 06/28/2006  . BPH (benign prostatic hyperplasia) 06/28/2006    Rexanne Mano, PT 10/21/2016, 1:29 PM  [facility] [address] [place], [place], [address] Phone: [phone]   Fax:  [phone]  Name: FAWAZ BORQUEZ MRN: [id number] Date of Birth: Jul 12, 1945

## 2016-10-23 ENCOUNTER — Ambulatory Visit: Payer: Medicare Other | Admitting: Physical Therapy

## 2016-10-23 ENCOUNTER — Encounter: Payer: Self-pay | Admitting: Physical Therapy

## 2016-10-23 VITALS — HR 98

## 2016-10-23 DIAGNOSIS — R2681 Unsteadiness on feet: Secondary | ICD-10-CM

## 2016-10-23 DIAGNOSIS — M6281 Muscle weakness (generalized): Secondary | ICD-10-CM

## 2016-10-23 DIAGNOSIS — R2689 Other abnormalities of gait and mobility: Secondary | ICD-10-CM | POA: Diagnosis not present

## 2016-10-23 DIAGNOSIS — I69354 Hemiplegia and hemiparesis following cerebral infarction affecting left non-dominant side: Secondary | ICD-10-CM | POA: Diagnosis not present

## 2016-10-23 NOTE — Therapy (Signed)
[facility] [address] [place], [place], [address] Phone: [phone]   Fax:  [phone]  Physical Therapy Treatment  Patient Details  Name: [name] MRN: [id number] Date of Birth: 11-26-1945 Referring Provider: Dr. Alysia Penna  Encounter Date: 10/23/2016      PT End of Session - 10/23/16 1616    Visit Number 29   Number of Visits 34   Date for PT Re-Evaluation 11/03/16   Authorization Type Medicare-G Codes and Progress Notes every 10th visit   PT Start Time 1405  Pt. 5 min late to appt.   PT Stop Time 1443   PT Time Calculation (min) 38 min   Equipment Utilized During Treatment Gait belt   Activity Tolerance Patient tolerated treatment well   Behavior During Therapy WFL for tasks assessed/performed      Past Medical History:  Diagnosis Date  . Anemia 07/29/2013  . Asthma    childhood asthma  . Benign prostatic hypertrophy    several prostate biopsies neg for cancer.   . Bilateral hip pain   . Cancer ([place])   . CHF (congestive heart failure) ([place])   . Complication of anesthesia    MORPHINE CAUSES HALLUCINATIONS  . CVA (cerebral vascular accident) ([place]) 06/2016  . Depression   . FH: colonic polyps 2010  . Gout   . History of kidney stones   . Hypertension   . Kidney cysts   . Morbid obesity ([place])   . Numbness    feet / legs  . PAF (paroxysmal atrial fibrillation) with RVR 07/29/2013  . Sleep apnea    USES C PAP    Past Surgical History:  Procedure Laterality Date  . ARTHROSCOPY KNEE W/ DRILLING    . BREATH TEK H PYLORI N/A 12/23/2012   Procedure: BREATH TEK H PYLORI;  Surgeon: Gayland Curry, MD;  Location: [facility];  Service: General;  Laterality: N/A;  . CHOLECYSTECTOMY  1989  . COLONOSCOPY N/A 07/31/2013   Procedure: COLONOSCOPY;  Surgeon: Gatha Mayer, MD;  Location: [facility];  Service: Endoscopy;  Laterality: N/A;  . ESOPHAGOGASTRODUODENOSCOPY N/A 07/22/2013   Procedure: ESOPHAGOGASTRODUODENOSCOPY (EGD);  Surgeon: Irene Shipper, MD;  Location: [facility] END[facility]COPY;  Service: Endoscopy;  Laterality: N/A;  . ESOPHAGOGASTRODUODENOSCOPY N/A 07/31/2013   Procedure: ESOPHAGOGASTRODUODENOSCOPY (EGD);  Surgeon: Gatha Mayer, MD;  Location: [facility] ENDOSCOPY;  Service: Endoscopy;  Laterality: N/A;  . INTRAOPERATIVE TRANSESOPHAGEAL ECHOCARDIOGRAM N/A 07/18/2013   Procedure: INTRAOPERATIVE TRANSESOPHAGEAL ECHOCARDIOGRAM;  Surgeon: Grace Isaac, MD;  Location: [place];  Service: Open Heart Surgery;  Laterality: N/A;  . LAPAROSCOPIC GASTRIC BANDING WITH HIATAL HERNIA REPAIR N/A 04/08/2013   Procedure: LAPAROSCOPIC GASTRIC BANDING WITH possible  HIATAL HERNIA REPAIR;  Surgeon: Gayland Curry, MD;  [facility];  Service: General;  Laterality: N/A;  . LITHOTRIPSY    . renal calculi    . SUBXYPHOID PERICARDIAL WINDOW N/A 07/18/2013   Procedure: SUBXYPHOID PERICARDIAL WINDOW;  Surgeon: Grace Isaac, MD;  Location: [facility];  Service: Thoracic;  Laterality: N/A;  . TONSILLECTOMY    . total knee replacment     bilat  . trigger finger repair     also lue, cts surgically repaired  . WISDOM TOOTH EXTRACTION      Vitals:   10/23/16 1414 10/23/16 1655  Pulse: 87 98  SpO2: 97% 96%        Subjective Assessment - 10/23/16 1414    Subjective No new changes, pain, or falls  to report. Wearing ankle brace.     Patient is accompained by: Family member   Pertinent History asthma, basal cell carcinoma, obesity, gout, depression, HTN, OSA, peripheral neuropathy, CHF, PAF, PE, bil TKA   Limitations Walking   Patient Stated Goals improve walking, balance, wants to drive again and be independent   Currently in Pain? No/denies             [facility] Adult PT Treatment/Exercise - 10/23/16 0001      Ambulation/Gait   Ambulation/Gait Yes   Ambulation/Gait Assistance 5: Supervision   Ambulation/Gait Assistance Details VC for cane placement and step length stepping over obstacles    Ambulation Distance (Feet) 200 Feet   Assistive device Small based quad cane   Gait Pattern Step-through pattern;Decreased stance time - left;Decreased step length - right;Wide base of support;Decreased arm swing - left;Decreased weight shift to left   Ambulation Surface Level;Indoor   Gait Comments Pt. ambulated over obstacles set up over a 25 foot path x4 laps; VC for step length, cane placement, and posture, with no LOB, clearing nearly all obstacles without significant reduction in gait speed. He navigated turns, following figure 8 pattern around hula hoops x3 laps in each direction with no LOB, steady pace; VC for posture.     Neuromuscular re-education:  Compliant surface: blue foam beam:  --EO, static, 20 seconds x2, no UE suppport --mini squats x10, no UE support, min guard,  --EO head turns R<>L x10, up<>down x10, diagonals both ways x10 each; no UE support, VC for weight shifting and posture, min guard to min assist as needed;  --alternating heel taps x10 each, unilateral UE support, min guard        PT Education - 10/23/16 1620    Education provided Yes   Education Details Pt. asked if there was anything that could be done about his voice. 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    Person(s) Educated Patient;Spouse   Methods Explanation   Comprehension Verbalized understanding          PT Short Term Goals - 08/08/16 1452      PT SHORT TERM GOAL #1   Title verbalize understanding of CVA risk factors/warning signs (08/10/16)   Status Achieved     PT SHORT TERM GOAL #2   Title improve timed up and go to < 30 sec for improved mobility (08/10/16)   Status Achieved     PT SHORT TERM GOAL #3   Title improve gait velocity to > 1.32 ft/sec for improved mobility and community access  (08/10/16)   Status Achieved     PT SHORT TERM GOAL #4   Title amb > 300' on level indoor/paved outdoor surfaces with LRAD with supervision for improved community access (08/10/16)   Status Achieved     PT SHORT TERM GOAL #5   Title perform BERG with appropriate LTG to be written (08/10/16)   Status Achieved           PT Long Term Goals - 09/22/16 1640      PT LONG TERM GOAL #1   Title independent with HEP (NEW TARGET DATE OF 11/03/2016)   Time 8   Period Weeks   Status On-going     PT LONG TERM GOAL #4   Title amb > 500'  on various indoor/outdoor surfaces and will negotiate up/down 7 stairs with cane and rail with supervision for improved independence and mobility (11/03/16)   Baseline 500' on outdoor pavement (uneven) with quad cane and min A; rollator with supervision   Time 8   Period Weeks   Status Revised     PT LONG TERM GOAL #5   Title Pt will decrease falls risk as indicated by an increase in BERG balance score to > or = 45/56 (11/03/16)   Baseline 42/56 on 09/22/16   Time 8   Period Weeks   Status Revised     PT LONG TERM GOAL #6   Title Pt will improve gait velocity with quad cane to > or = 2.23ft/sec for decreased falls risk during household and limited community ambulation with LRAD (11/03/16)   Baseline 2.75 ft/sec with quad cane on 6/22   Time 8   Period Weeks   Status Revised               Plan - 10/23/16 1649    Clinical Impression Statement Today's skilled session focused on dynamic balance in standing and with gait. Patient demonstrated ability to navigate over and around obstacles with no LOB and without significant decrease in gait speed. Dynamic balance in standing on compliant surfaces is improved requiring decreased level of UE support to maintain balance. Pt. became quite SOB after gait trials. Vital signs were taken and found to be WNL. Pt. is progressing towards goals and will benefit from continued skilled PT to progress towards unmet goals.    Rehab Potential Good   Clinical Impairments Affecting Rehab Potential multiple comorbidities   PT Frequency 2x / week   PT Duration 8 weeks   PT Treatment/Interventions ADLs/Self Care Home Management;Cryotherapy;Electrical Stimulation;Neuromuscular re-education;Balance training;Therapeutic exercise;Therapeutic activities;Functional mobility training;Gait training;Stair training;Patient/family education;Orthotic Fit/Training;Manual techniques;Vestibular;Energy conservation;Moist Heat;DME Instruction   PT Next Visit Plan check G-codes, Reciprocal stepping on stairs, SLS, endurance with walking on unlevel surfaces   Consulted and Agree with Plan of Care Patient;Family member/caregiver   Family Member Consulted wife      Patient will benefit from skilled therapeutic intervention in order to improve the following deficits and impairments:  Abnormal gait, Decreased balance, Decreased activity tolerance, Decreased strength, Postural dysfunction, Obesity, Decreased endurance, Decreased knowledge of use of DME, Decreased mobility, Difficulty walking, Dizziness, Decreased coordination, Impaired sensation, Impaired vision/preception  Visit Diagnosis: Other abnormalities of gait and mobility  Unsteadiness on feet  Muscle weakness (generalized)     Problem List Patient Active Problem List   Diagnosis Date Noted  . Benign essential HTN   . 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 Embolic cerebral infarction ([place]) 06/21/2016  . Stroke syndrome ([place])   . OSA (obstructive sleep apnea)   . Dysphagia, post-stroke   . Ataxia, post-stroke   . Neurologic gait disorder   . Encounter for monitoring flecainide therapy 04/28/2016  . Pure hyperglyceridemia 04/26/2016  . Recurrent pulmonary emboli ([place]) 06/13/2015  . GERD (gastroesophageal reflux disease) 06/13/2015  . Asthma 06/13/2015  . Basal cell carcinoma of skin 06/28/2014  . 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 Sinus bradycardia  12/15/2013  . PAF (paroxysmal atrial fibrillation) ([place]) 07/29/2013  . Chronic anticoagulation 07/22/2013  . Gastric ulcer with hemorrhage 07/20/13 07/22/2013  . Diastolic congestive heart failure ([place]) 07/16/2013  . H/O laparoscopic adjustable gastric banding & hiatal hernia repair 04/08/13 04/23/2013  . Prediabetes 02/05/2013  . TMJ (temporomandibular joint syndrome) 09/11/2012  . Morbid obesity ([place]) 08/09/2012  .  Peripheral neuropathy ([place]) 11/02/2009  . Hyperlipidemia with target LDL less than 130 09/10/2008  . Depression 09/10/2008  . GOUT 06/28/2006  . BPH (benign prostatic hyperplasia) 06/28/2006    Rulon Eisenmenger, SPTA 10/23/2016, 4:56 PM  [facility] [address] [place], [place], [address] Phone: [phone]   Fax:  [phone]  Name: AZAAN LEASK MRN: [id number] Date of Birth: [birth year]

## 2016-10-24 ENCOUNTER — Ambulatory Visit (INDEPENDENT_AMBULATORY_CARE_PROVIDER_SITE_OTHER): Payer: Medicare Other | Admitting: Internal Medicine

## 2016-10-24 ENCOUNTER — Encounter: Payer: Self-pay | Admitting: Internal Medicine

## 2016-10-24 ENCOUNTER — Other Ambulatory Visit (INDEPENDENT_AMBULATORY_CARE_PROVIDER_SITE_OTHER): Payer: Medicare Other

## 2016-10-24 DIAGNOSIS — I480 Paroxysmal atrial fibrillation: Secondary | ICD-10-CM | POA: Diagnosis not present

## 2016-10-24 DIAGNOSIS — G463 Brain stem stroke syndrome: Secondary | ICD-10-CM

## 2016-10-24 DIAGNOSIS — I10 Essential (primary) hypertension: Secondary | ICD-10-CM

## 2016-10-24 LAB — BASIC METABOLIC PANEL
BUN: 18 mg/dL (ref 6–23)
CHLORIDE: 105 meq/L (ref 96–112)
CO2: 31 mEq/L (ref 19–32)
Calcium: 8.7 mg/dL (ref 8.4–10.5)
Creatinine, Ser: 1.09 mg/dL (ref 0.40–1.50)
GFR: 70.91 mL/min (ref >60.00)
Glucose, Bld: 107 mg/dL — ABNORMAL HIGH (ref 70–99)
Potassium: 3.6 mEq/L (ref 3.5–5.1)
Sodium: 142 mEq/L (ref 135–145)

## 2016-10-24 NOTE — Patient Instructions (Signed)

## 2016-10-24 NOTE — Progress Notes (Signed)
Subjective:  Patient ID: [name], male    DOB: 10/09/1945  Age: 71 y.o. 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 He offers no new or different complaints today. He tells me his blood pressure has been well controlled and he has had no headache/blurred vision/chest pain/shortness of breath/palpitations/edema/fatigue.  Outpatient Medications Prior to Visit  Medication Sig Dispense Refill  . apixaban (ELIQUIS) 5 MG TABS tablet Take 1 tablet (5 mg total) by mouth 2 (two) times daily. 180 tablet 3  . aspirin 81 MG tablet Take 81 mg by mouth 2 (two) times daily.     [name] atorvastatin (LIPITOR) 80 MG tablet Take 1 tablet (80 mg total) by mouth daily. 90 tablet 1  . CALCIUM PO Take 500 mg by mouth 3 (three) times daily.     . Cyanocobalamin (VITAMIN B-12) 1000 MCG SUBL Place 1,000 mcg under the tongue every evening.     [name] Sodium (DSS) 100 MG CAPS Take 100 mg by mouth every other day.    . finasteride (PROSCAR) 5 MG tablet Take 1 tablet (5 mg total) by mouth daily. 30 tablet 1  . flecainide (TAMBOCOR) 50 MG tablet Take 1 tablet (50 mg total) by mouth every 12 (twelve) hours. 180 tablet 2  . furosemide (LASIX) 40 MG tablet TAKE 1 TABLET BY MOUTH EVERY DAY 90 tablet 1  . KLOR-CON M20 20 MEQ tablet TAKE 2 TABLETS BY MOUTH EVERY DAY 60 tablet 3  . loratadine (CLARITIN) 10 MG tablet Take 10 mg by mouth daily.    [name] LORazepam (ATIVAN) 0.5 MG tablet Take 1 tablet (0.5 mg total) by mouth at bedtime. 30 tablet 2  . montelukast (SINGULAIR) 10 MG tablet TAKE 1 TABLET BY MOUTH EVERYDAY AT BEDTIME 30 tablet 5  . pantoprazole (PROTONIX) 40 MG tablet TAKE 1 TABLET BY MOUTH EVERY DAY 90 tablet 1  . rOPINIRole (REQUIP) 0.5 MG tablet TAKE 1 TABLET AT BEDTIME 90 tablet 1  . sertraline (ZOLOFT) 100 MG tablet Take 1.5 tablets (150 mg total) by mouth daily. 45 tablet 2  . traMADol (ULTRAM) 50 MG  tablet Take 1 tablet (50 mg total) by mouth every 8 (eight) hours as needed for severe pain. 20 tablet 0  . fluticasone furoate-vilanterol (BREO ELLIPTA) 200-25 MCG/INH AEPB Inhale 1 puff into the lungs daily. 1 each 0  . Multiple Vitamins-Iron (MULTIVITAMINS WITH IRON) TABS tablet Take 1 tablet by mouth daily.    . budesonide-formoterol (SYMBICORT) 160-4.5 MCG/ACT inhaler Inhale 2 puffs into the lungs 2 (two) times daily as needed (shortness of breath).     No facility-administered medications prior to visit.     ROS Review of Systems  Constitutional: Negative for diaphoresis, fatigue and unexpected weight change.  HENT: Negative.   Eyes: Negative for visual disturbance.  Respiratory: Negative for cough, chest tightness, shortness of breath and wheezing.   Cardiovascular: Negative for chest pain, palpitations and leg swelling.  Gastrointestinal: Negative for abdominal pain, constipation, diarrhea, nausea and vomiting.  Endocrine: Negative.   Genitourinary: Negative.  Negative for difficulty urinating.  Musculoskeletal: Positive for gait problem.  Skin: Negative.   Neurological: Positive for weakness. Negative for dizziness and headaches.  Hematological: Negative for adenopathy. Does not bruise/bleed easily.  Psychiatric/Behavioral: Negative.     Objective:  BP (!) 144/88 (BP Location: Left Arm, Patient Position: Sitting, Cuff Size: Large)   Pulse  70   Temp 97.7 F (36.5 C) (Oral)   Resp 16   Ht 6' (1.829 m)   Wt (!) 355 lb (161 kg)   SpO2 97%   BMI 48.15 kg/m   BP Readings from Last 3 Encounters:  10/24/16 (!) 144/88  10/03/16 (!) 151/87  10/03/16 120/70    Wt Readings from Last 3 Encounters:  10/24/16 (!) 355 lb (161 kg)  10/03/16 (!) 355 lb 12.8 oz (161.4 kg)  10/02/16 (!) 357 lb (161.9 kg)    Physical Exam  Constitutional: He is oriented to person, place, and time. No distress.  HENT:  Mouth/Throat: Oropharynx is clear and moist. No oropharyngeal exudate.    Eyes: Conjunctivae are normal. Right eye exhibits no discharge. Left eye exhibits no discharge. No scleral icterus.  Neck: Normal range of motion. Neck supple. No JVD present. No thyromegaly present.  Cardiovascular: Normal rate, regular rhythm and intact distal pulses.  Exam reveals no gallop and no friction rub.   No murmur heard. Pulmonary/Chest: Effort normal and breath sounds normal. No respiratory distress. He has no wheezes. He has no rales. He exhibits no tenderness.  Abdominal: Soft. Bowel sounds are normal. He exhibits no distension and no mass. There is no tenderness. There is no rebound and no guarding.  Musculoskeletal: Normal range of motion. He exhibits no edema or tenderness.  Lymphadenopathy:    He has no cervical adenopathy.  Neurological: He is alert and oriented to person, place, and time.  Skin: Skin is warm and dry. No rash noted. No erythema. No pallor.  Vitals reviewed.   Lab Results  Component Value Date   WBC 8.7 07/10/2016   HGB 13.2 07/10/2016   HCT 41.0 07/10/2016   PLT 196 07/10/2016   GLUCOSE 107 (H) 10/24/2016   CHOL 124 06/19/2016   TRIG 74 06/19/2016   HDL 32 (L) 06/19/2016   LDLDIRECT 168.0 10/20/2015   LDLCALC 77 06/19/2016   ALT 19 06/22/2016   AST 26 06/22/2016   NA 142 10/24/2016   K 3.6 10/24/2016   CL 105 10/24/2016   CREATININE 1.09 10/24/2016   BUN 18 10/24/2016   CO2 31 10/24/2016   TSH 2.10 10/24/2016   PSA 0.68 12/24/2015   INR 1.09 06/19/2016   HGBA1C 5.8 (H) 06/19/2016   MICROALBUR 1.1 12/12/2006    Dg Swallowing Func-speech Pathology  Result Date: 06/21/2016 Objective Swallowing Evaluation: Type of Study: MBS-Modified Barium Swallow Study Patient Details Name: [name] MRN: [id number] Date of Birth: [birth year] Today's Date: 06/21/2016 Time: SLP Start Time (ACUTE ONLY): 0754-SLP Stop Time (ACUTE ONLY): 0820 SLP Time Calculation (min) (ACUTE ONLY): 26 min Past Medical History: Past Medical History: Diagnosis Date .  Anemia 07/29/2013 . Asthma   childhood asthma . Benign prostatic hypertrophy   several prostate biopsies neg for cancer.  . Bilateral hip pain  . Cancer ([place])  . CHF (congestive heart failure) ([place])  . Complication of anesthesia   MORPHINE CAUSES HALLUCINATIONS . Depression  . FH: colonic polyps 2010 . Gout  . History of kidney stones  . Hypertension  . Kidney cysts  . Morbid obesity ([place])  . Numbness   feet / legs . PAF (paroxysmal atrial fibrillation) with RVR 07/29/2013 . Sleep apnea   USES C PAP Past Surgical History: Past Surgical History: Procedure Laterality Date . ARTHROSCOPY KNEE W/ DRILLING   . BREATH TEK H PYLORI N/A 12/23/2012  Procedure: BREATH TEK H PYLORI;  Surgeon: Gayland Curry, MD;  Location: [facility]  END[facility];  Service: General;  Laterality: N/A; . CHOLECYSTECTOMY  1989 . COLONOSCOPY N/A 07/31/2013  Procedure: COLONOSCOPY;  Surgeon: Gatha Mayer, MD;  Location: [place];  Service: Endoscopy;  Laterality: N/A; . ESOPHAGOGASTRODUODENOSCOPY N/A 07/22/2013  Procedure: ESOPHAGOGASTRODUODENOSCOPY (EGD);  Surgeon: Irene Shipper, MD;  Location[facility] ENDOSCOPY;  Service: Endoscopy;  Laterality: N/A; . ESOPHAGOGASTRODUODENOSCOPY N/A 07/31/2013  Procedure: ESOPHAGOGASTRODUODENOSCOPY (EGD);  Surgeon: Gatha Mayer, MD;  Location: [facility] ENDOSCOPY;  Service: Endoscopy;  Laterality: N/A; . INTRAOPERATIVE TRANSESOPHAGEAL ECHOCARDIOGRAM N/A 07/18/2013  Procedure: INTRAOPERATIVE TRANSESOPHAGEAL ECHOCARDIOGRAM;  Surgeon: Grace Isaac, MD;  Location: [place];  Service: Open Heart Surgery;  Laterality: N/A; . LAPAROSCOPIC GASTRIC BANDING WITH HIATAL HERNIA REPAIR N/A 04/08/2013  Procedure: LAPAROSCOPIC GASTRIC BANDING WITH possible  HIATAL HERNIA REPAIR;  Surgeon: Gayland Curry, MD;  Location: [facility];  Service: General;  Laterality: N/A; . LITHOTRIPSY   . renal calculi   . SUBXYPHOID PERICARDIAL WINDOW N/A 07/18/2013  Procedure: SUBXYPHOID PERICARDIAL WINDOW;  Surgeon: Grace Isaac, MD;  Location: [place];  Service: Thoracic;   Laterality: N/A; . TONSILLECTOMY   . total knee replacment    bilat . trigger finger repair    also lue, cts surgically repaired . WISDOM TOOTH EXTRACTION   HPI: 71 yo male adm to [facility] with headache, found to have single focus of remote microhemorrhage in the L Pon, abnormal signal within V4 segment of L vertebral artery, faint focus of reduced diffusion within L medulla concerning for small infarct. Pt with worsening neurological symptoms during hospital stay including left sided weakness, dysphagia, vision deficits and dizziness.  Pt passed RNSSS initially- SlP swallow evaluation ordered due to pt's sudden onset of dysphagia.   Subjective: pt awake in chair Assessment / Plan / Recommendation CHL IP CLINICAL IMPRESSIONS 06/21/2016 Clinical Impression Pt presents with mild oropharyngeal dysphagia resulting in decreased swallow coordination.  Explosive coughing noted with large amount of aspiration of thin *pt cued to "gulp", aspirates were deep and did not clear.  Small single boluses of thin result in trace laryngeal penetration of thin clearing with intermittent cued throat clear.  Chin tuck posture worsened swalllow/penetration.  Pharyngeal swallow c/b decreased timing of laryngeal closure and decreased tongue base retraction/epiglottic deflection with mild vallecular residuals of thicker consistencies.  Fortunately pt sensed residuals and conducted dry swallow to clear.  Of note, pt frequently cleared his throat during evaluation/MBS and reports occured prior to admission - he attributes this to nasal drainage/allergies.  Using live video, educated pt to findings/recommendations.  SLP will follow up for skilled SLP treatment to maximize swallow/phonation function.     SLP Visit Diagnosis Dysphagia, oropharyngeal phase (R13.12) Attention and concentration deficit following -- Frontal lobe and executive function deficit following -- Impact on safety and function Mild aspiration risk   CHL IP TREATMENT  RECOMMENDATION 06/21/2016 Treatment Recommendations Therapy as outlined in treatment plan below   Prognosis 06/21/2016 Prognosis for Safe Diet Advancement Good Barriers to Reach Goals -- Barriers/Prognosis Comment -- CHL IP DIET RECOMMENDATION 06/21/2016 SLP Diet Recommendations Dysphagia 3 (Mech soft) solids;Thin liquid Liquid Administration via Cup;No straw Medication Administration Whole meds with puree Compensations Slow rate;Small sips/bites;Multiple dry swallows after each bite/sip;Clear throat intermittently Postural Changes Remain semi-upright after after feeds/meals (Comment);Seated upright at 90 degrees   CHL IP OTHER RECOMMENDATIONS 06/21/2016 Recommended Consults -- Oral Care Recommendations Oral care BID Other Recommendations --   CHL IP FOLLOW UP RECOMMENDATIONS 06/21/2016 Follow up Recommendations Inpatient Rehab   CHL IP FREQUENCY AND DURATION 06/21/2016  Speech Therapy Frequency (ACUTE ONLY) min 2x/week Treatment Duration --      CHL IP ORAL PHASE 06/21/2016 Oral Phase Impaired Oral - Pudding Teaspoon -- Oral - Pudding Cup -- Oral - Honey Teaspoon -- Oral - Honey Cup -- Oral - Nectar Teaspoon -- Oral - Nectar Cup WFL Oral - Nectar Straw -- Oral - Thin Teaspoon WFL Oral - Thin Cup WFL Oral - Thin Straw WFL Oral - Puree WFL Oral - Mech Soft -- Oral - Regular WFL Oral - Multi-Consistency -- Oral - Pill Weak lingual manipulation;Reduced posterior propulsion;Delayed oral transit Oral Phase - Comment --  CHL IP PHARYNGEAL PHASE 06/21/2016 Pharyngeal Phase Impaired Pharyngeal- Pudding Teaspoon -- Pharyngeal -- Pharyngeal- Pudding Cup -- Pharyngeal -- Pharyngeal- Honey Teaspoon -- Pharyngeal -- Pharyngeal- Honey Cup -- Pharyngeal -- Pharyngeal- Nectar Teaspoon WFL Pharyngeal -- Pharyngeal- Nectar Cup -- Pharyngeal -- Pharyngeal- Nectar Straw -- Pharyngeal -- Pharyngeal- Thin Teaspoon WFL Pharyngeal -- Pharyngeal- Thin Cup Penetration/Aspiration during swallow;Pharyngeal residue - valleculae Pharyngeal Material  enters airway, remains ABOVE vocal cords and not ejected out Pharyngeal- Thin Straw Reduced laryngeal elevation;Significant aspiration (Amount) Pharyngeal -- Pharyngeal- Puree Reduced epiglottic inversion;Reduced tongue base retraction;Pharyngeal residue - valleculae Pharyngeal -- Pharyngeal- Mechanical Soft -- Pharyngeal -- Pharyngeal- Regular Reduced epiglottic inversion;Reduced tongue base retraction;Pharyngeal residue - valleculae Pharyngeal -- Pharyngeal- Multi-consistency -- Pharyngeal -- Pharyngeal- Pill Reduced epiglottic inversion;Reduced tongue base retraction;Pharyngeal residue - valleculae Pharyngeal -- Pharyngeal Comment --  CHL IP CERVICAL ESOPHAGEAL PHASE 06/21/2016 Cervical Esophageal Phase WFL Pudding Teaspoon -- Pudding Cup -- Honey Teaspoon -- Honey Cup -- Nectar Teaspoon -- Nectar Cup -- Nectar Straw -- Thin Teaspoon -- Thin Cup -- Thin Straw -- Puree -- Mechanical Soft -- Regular -- Multi-consistency -- Pill -- Cervical Esophageal Comment -- Luanna Salk, MS [facility] SLP [phone]               Assessment & Plan:   [name] was seen today for hypertension.  Diagnoses and all orders for this visit:  Morbid obesity ([place])- He agrees to work on his lifestyle modifications to lose weight -     Thyroid Panel With TSH; Future  Benign essential HTN- his blood pressures well controlled, electrolytes and renal function are normal -     Basic metabolic panel; Future  PAF (paroxysmal atrial fibrillation) ([place])- he is maintained in sinus rhythm, will continue anticoagulation with Eliquis -     Thyroid Panel With TSH; Future   I have discontinued Mr. Tippett budesonide-formoterol, fluticasone furoate-vilanterol, and multivitamins with iron. I am also having him maintain his Vitamin B-12, CALCIUM PO, DSS, aspirin, loratadine, apixaban, finasteride, flecainide, traMADol, atorvastatin, sertraline, LORazepam, furosemide, rOPINIRole, pantoprazole, KLOR-CON M20, and montelukast.  No orders of the  defined types were placed in this encounter.    Follow-up: Return in about 6 months (around 04/26/2017).  [name], MD

## 2016-10-25 ENCOUNTER — Encounter: Payer: Self-pay | Admitting: Internal Medicine

## 2016-10-25 LAB — THYROID PANEL WITH TSH
Free Thyroxine Index: 2.1 (ref 1.4–3.8)
T3 UPTAKE: 35 % (ref 22–35)
T4 TOTAL: 6 ug/dL (ref 4.5–12.0)
TSH: 2.1 mIU/L (ref 0.40–4.50)

## 2016-10-27 ENCOUNTER — Ambulatory Visit: Payer: Medicare Other | Admitting: Physical Therapy

## 2016-10-27 DIAGNOSIS — R2681 Unsteadiness on feet: Secondary | ICD-10-CM

## 2016-10-27 DIAGNOSIS — M6281 Muscle weakness (generalized): Secondary | ICD-10-CM | POA: Diagnosis not present

## 2016-10-27 DIAGNOSIS — I69354 Hemiplegia and hemiparesis following cerebral infarction affecting left non-dominant side: Secondary | ICD-10-CM

## 2016-10-27 DIAGNOSIS — R2689 Other abnormalities of gait and mobility: Secondary | ICD-10-CM | POA: Diagnosis not present

## 2016-10-27 NOTE — Therapy (Addendum)
[facility] [address] [place], [place], [address] Phone: [phone]   Fax:  [phone]  Physical Therapy Treatment  Patient Details  Name: [name] MRN: [id number] Date of Birth: Oct 21, 1945 Referring Provider: Dr. Alysia Penna  Encounter Date: 10/27/2016      PT End of Session - 10/27/16 1556    Visit Number 30   Number of Visits 34   Date for PT Re-Evaluation 11/03/16   Authorization Type Medicare-G Codes and Progress Notes every 10th visit   PT Start Time 1400   PT Stop Time 1448   PT Time Calculation (min) 48 min   Equipment Utilized During Treatment Gait belt   Activity Tolerance Patient tolerated treatment well   Behavior During Therapy [facility] for tasks assessed/performed      Past Medical History:  Diagnosis Date  . Anemia 07/29/2013  . Asthma    childhood asthma  . Benign prostatic hypertrophy    several prostate biopsies neg for cancer.   . Bilateral hip pain   . Cancer ([place])   . CHF (congestive heart failure) ([place])   . Complication of anesthesia    MORPHINE CAUSES HALLUCINATIONS  . CVA (cerebral vascular accident) ([place]) 06/2016  . Depression   . FH: colonic polyps 2010  . Gout   . History of kidney stones   . Hypertension   . Kidney cysts   . Morbid obesity ([place])   . Numbness    feet / legs  . PAF (paroxysmal atrial fibrillation) with RVR 07/29/2013  . Sleep apnea    USES C PAP    Past Surgical History:  Procedure Laterality Date  . ARTHROSCOPY KNEE W/ DRILLING    . BREATH TEK H PYLORI N/A 12/23/2012   Procedure: BREATH TEK H PYLORI;  Surgeon: Gayland Curry, MD;  Location: [facility];  Service: General;  Laterality: N/A;  . CHOLECYSTECTOMY  1989  . COLONOSCOPY N/A 07/31/2013   Procedure: COLONOSCOPY;  Surgeon: Gatha Mayer, MD;  Location: [place];  Service: Endoscopy;  Laterality: N/A;  . ESOPHAGOGASTRODUODENOSCOPY N/A 07/22/2013   Procedure:  ESOPHAGOGASTRODUODENOSCOPY (EGD);  Surgeon: Irene Shipper, MD;  Location: [facility] ENDOSCOPY;  Service: Endoscopy;  Laterality: N/A;  . ESOPHAGOGASTRODUODENOSCOPY N/A 07/31/2013   Procedure: ESOPHAGOGASTRODUODENOSCOPY (EGD);  Surgeon: Gatha Mayer, MD;  Location[facility] ENDOSCOPY;  Service: Endoscopy;  Laterality: N/A;  . INTRAOPERATIVE TRANSESOPHAGEAL ECHOCARDIOGRAM N/A 07/18/2013   Procedure: INTRAOPERATIVE TRANSESOPHAGEAL ECHOCARDIOGRAM;  Surgeon: Grace Isaac, MD;  Location: [place];  Service: Open Heart Surgery;  Laterality: N/A;  . LAPAROSCOPIC GASTRIC BANDING WITH HIATAL HERNIA REPAIR N/A 04/08/2013   Procedure: LAPAROSCOPIC GASTRIC BANDING WITH possible  HIATAL HERNIA REPAIR;  Surgeon: Gayland Curry, MD;  Location: [facility];  Service: General;  Laterality: N/A;  . LITHOTRIPSY    . renal calculi    . SUBXYPHOID PERICARDIAL WINDOW N/A 07/18/2013   Procedure: SUBXYPHOID PERICARDIAL WINDOW;  Surgeon: Grace Isaac, MD;  Location: [place];  Service: Thoracic;  Laterality: N/A;  . TONSILLECTOMY    . total knee replacment     bilat  . trigger finger repair     also lue, cts surgically repaired  . WISDOM TOOTH EXTRACTION      There were no vitals filed for this visit.      Subjective Assessment - 10/27/16 1407    Subjective Pt having more headaches this week; had one fall yesterday where he bent down to pick something up off the floor  became dizzy and fell backwards into the piano.     Patient is accompained by: Family member   Pertinent History asthma, basal cell carcinoma, obesity, gout, depression, HTN, OSA, peripheral neuropathy, CHF, PAF, PE, bil TKA   Limitations Walking   Patient Stated Goals improve walking, balance, wants to drive again and be independent   Currently in Pain? Yes   Pain Score 2    Pain Location Head   Pain Orientation Left   Pain Descriptors / Indicators Headache            [facility] PT Assessment - 10/27/16 1413      Standardized Balance Assessment   10 Meter  Walk 8:48 seconds or 3.8 ft/sec; without cane 9.03 seconds or 3.6 ft/sec     Berg Balance Test   Sit to Stand Able to stand without using hands and stabilize independently   Standing Unsupported Able to stand safely 2 minutes   Sitting with Back Unsupported but Feet Supported on Floor or Stool Able to sit safely and securely 2 minutes   Stand to Sit Sits safely with minimal use of hands   Transfers Able to transfer safely, minor use of hands   Standing Unsupported with Eyes Closed Able to stand 10 seconds with supervision   Standing Ubsupported with Feet Together Able to place feet together independently and stand 1 minute safely   From Standing, Reach Forward with Outstretched Arm Can reach confidently >25 cm (10")   From Standing Position, Pick up Object from Floor Able to pick up shoe, needs supervision   From Standing Position, Turn to Look Behind Over each Shoulder Looks behind from both sides and weight shifts well   Turn 360 Degrees Able to turn 360 degrees safely in 4 seconds or less   Standing Unsupported, Alternately Place Feet on Step/Stool Able to stand independently and safely and complete 8 steps in 20 seconds   Standing Unsupported, One Foot in Front Able to plae foot ahead of the other independently and hold 30 seconds   Standing on One Leg Tries to lift leg/unable to hold 3 seconds but remains standing independently   Total Score 50   Berg comment: 50/56                          Balance Exercises - 10/27/16 1445      Balance Exercises: Standing   Standing Eyes Opened Wide (BOA);Head turns;Solid surface;Other reps (comment)  10 reps head turns/nods on ramp feet up/down   Standing Eyes Closed Wide (BOA);10 secs;2 reps  ramp feet facing up and down           PT Education - 10/27/16 1554    Education provided Yes   Education Details progress towards goals; areas to continue to focus on: balance on compliant surfaces, balance when reaching to floor,  vestibular training   Person(s) Educated Patient   Methods Explanation   Comprehension Verbalized understanding          PT Short Term Goals - 08/08/16 1452      PT SHORT TERM GOAL #1   Title verbalize understanding of CVA risk factors/warning signs (08/10/16)   Status Achieved     PT SHORT TERM GOAL #2   Title improve timed up and go to < 30 sec for improved mobility (08/10/16)   Status Achieved     PT SHORT TERM GOAL #3   Title improve gait velocity to > 1.32 ft/sec for  improved mobility and community access (08/10/16)   Status Achieved     PT SHORT TERM GOAL #4   Title amb > 300' on level indoor/paved outdoor surfaces with LRAD with supervision for improved community access (08/10/16)   Status Achieved     PT SHORT TERM GOAL #5   Title perform BERG with appropriate LTG to be written (08/10/16)   Status Achieved           PT Long Term Goals - 10/27/16 1600      PT LONG TERM GOAL #1   Title independent with HEP (NEW TARGET DATE OF 11/03/2016)   Time 8   Period Weeks   Status On-going     PT LONG TERM GOAL #4   Title amb > 500' on various indoor/outdoor surfaces and will negotiate up/down 7 stairs with cane and rail with supervision for improved independence and mobility (11/03/16)   Baseline 500' on outdoor pavement (uneven) with quad cane and min A; rollator with supervision   Time 8   Period Weeks   Status Revised     PT LONG TERM GOAL #5   Title Pt will decrease falls risk as indicated by an increase in BERG balance score to > or = 45/56 (11/03/16)   Baseline 42/56 on 09/22/16; 50/56 on 10/27/16   Status Achieved     PT LONG TERM GOAL #6   Title Pt will improve gait velocity with quad cane to > or = 2.63ft/sec for decreased falls risk during household and limited community ambulation with LRAD (11/03/16)   Baseline 2.75 ft/sec with quad cane on 6/22; 3.8 ft/sec with cane   Time --   Period Weeks   Status Achieved               Plan - 10/27/16 1556     Clinical Impression Statement Treatment today focused on assessment of pt progress with reassessment of BERG balance assessment and gait velocity.  Pt has show significant improvement in each and has achieved both goals.  Continued balance and vestibular training standing on uneven surface (ramp/incline) while decreasing visual input with head turns and eyes closed.  Pt continue to demonstrate impaired balance on uneven and compliant surfaces and ability to control and correct balance when reaching out of BOS.  Will continue to focus on these impairments and continue reassessment of LTG next week to determine plan for recertification.   Rehab Potential Good   Clinical Impairments Affecting Rehab Potential multiple comorbidities   PT Frequency 2x / week   PT Duration 8 weeks   PT Treatment/Interventions ADLs/Self Care Home Management;Cryotherapy;Electrical Stimulation;Neuromuscular re-education;Balance training;Therapeutic exercise;Therapeutic activities;Functional mobility training;Gait training;Stair training;Patient/family education;Orthotic Fit/Training;Manual techniques;Vestibular;Energy conservation;Moist Heat;DME Instruction   PT Next Visit Plan Reciprocal stepping on stairs and work towards stepping up with cane only, SLS, endurance with walking on uneven/compliant surfaces, reaching to floor for objects and maintaining balance   Consulted and Agree with Plan of Care Patient;Family member/caregiver   Family Member Consulted wife      Patient will benefit from skilled therapeutic intervention in order to improve the following deficits and impairments:  Abnormal gait, Decreased balance, Decreased activity tolerance, Decreased strength, Postural dysfunction, Obesity, Decreased endurance, Decreased knowledge of use of DME, Decreased mobility, Difficulty walking, Dizziness, Decreased coordination, Impaired sensation, Impaired vision/preception  Visit Diagnosis: Hemiplegia and hemiparesis following  cerebral infarction affecting left non-dominant side (HCC)  Other abnormalities of gait and mobility  Muscle weakness (generalized)  Unsteadiness on feet  Physical Therapy Progress  Note  Dates of Reporting Period: 09/22/16 to 2016/10/31  Objective Reports of Subjective Statement: See above for progress  Objective Measurements: BERG and gait velocity: see above  Goal Update: 2 goals achieved  Plan: Continue with PT POC to address balance, gait, endurance and vestibular impairments.  Reason Skilled Services are Required: To continue to improve functional mobility independence and decrease risk for falls       G-Codes - 31-Oct-2016 1601    Functional Assessment Tool Used (Outpatient Only) Berg 50/56, gait velocity 3.8 ft/sec   Functional Limitation Mobility: Walking and moving around   Mobility: Walking and Moving Around Current Status [phone]) At least 1 percent but less than 20 percent impaired, limited or restricted   Mobility: Walking and Moving Around Goal Status [phone]) At least 20 percent but less than 40 percent impaired, limited or restricted      Problem List Patient Active Problem List   Diagnosis Date Noted  . Benign essential HTN   . 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 Embolic cerebral infarction ([place]) 06/21/2016  . Stroke syndrome ([place])   . OSA (obstructive sleep apnea)   . Dysphagia, post-stroke   . Neurologic gait disorder   . Encounter for monitoring flecainide therapy 04/28/2016  . Recurrent pulmonary emboli ([place]) 06/13/2015  . GERD (gastroesophageal reflux disease) 06/13/2015  . Basal cell carcinoma of skin 06/28/2014  . 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 PAF (paroxysmal atrial fibrillation) ([place]) 07/29/2013  . Chronic anticoagulation 07/22/2013  . Gastric ulcer with hemorrhage 07/20/13 07/22/2013  . Diastolic congestive heart failure ([place]) 07/16/2013  . H/O laparoscopic adjustable gastric banding & hiatal hernia repair 04/08/13 04/23/2013  .  Prediabetes 02/05/2013  . TMJ (temporomandibular joint syndrome) 09/11/2012  . Morbid obesity ([place]) 08/09/2012  . Peripheral neuropathy ([place]) 11/02/2009  . Hyperlipidemia with target LDL less than 130 09/10/2008  . GOUT 06/28/2006  . BPH (benign prostatic hyperplasia) 06/28/2006    Raylene Everts, PT, DPT 2016/10/31    4:05 PM    [facility] [address] [place], [place], [address] Phone: [phone]   Fax:  [phone]  Name: RAQUAN IANNONE MRN: [id number] Date of Birth: [birth year]

## 2016-10-30 ENCOUNTER — Ambulatory Visit: Payer: Medicare Other | Admitting: Physical Therapy

## 2016-10-30 ENCOUNTER — Encounter: Payer: Self-pay | Admitting: Physical Therapy

## 2016-10-30 DIAGNOSIS — R2689 Other abnormalities of gait and mobility: Secondary | ICD-10-CM

## 2016-10-30 DIAGNOSIS — I69354 Hemiplegia and hemiparesis following cerebral infarction affecting left non-dominant side: Secondary | ICD-10-CM | POA: Diagnosis not present

## 2016-10-30 DIAGNOSIS — R2681 Unsteadiness on feet: Secondary | ICD-10-CM

## 2016-10-30 DIAGNOSIS — M6281 Muscle weakness (generalized): Secondary | ICD-10-CM | POA: Diagnosis not present

## 2016-10-31 NOTE — Therapy (Signed)
[facility] [address] [place], [place], [address] Phone: [phone]   Fax:  [phone]  Physical Therapy Treatment  Patient Details  Name: [name] MRN: [id number] Date of Birth: Apr 23, 1945 Referring Provider: Dr. Alysia Penna  Encounter Date: 10/30/2016      PT End of Session - 10/30/16 1410    Visit Number 31   Number of Visits 34   Date for PT Re-Evaluation 11/03/16   Authorization Type Medicare-G Codes and Progress Notes every 10th visit   PT Start Time 1403   PT Stop Time 1444   PT Time Calculation (min) 41 min   Equipment Utilized During Treatment Gait belt   Activity Tolerance Patient tolerated treatment well   Behavior During Therapy [facility] for tasks assessed/performed      Past Medical History:  Diagnosis Date  . Anemia 07/29/2013  . Asthma    childhood asthma  . Benign prostatic hypertrophy    several prostate biopsies neg for cancer.   . Bilateral hip pain   . Cancer ([place])   . CHF (congestive heart failure) ([place])   . Complication of anesthesia    MORPHINE CAUSES HALLUCINATIONS  . CVA (cerebral vascular accident) ([place]) 06/2016  . Depression   . FH: colonic polyps 2010  . Gout   . History of kidney stones   . Hypertension   . Kidney cysts   . Morbid obesity ([place])   . Numbness    feet / legs  . PAF (paroxysmal atrial fibrillation) with RVR 07/29/2013  . Sleep apnea    USES C PAP    Past Surgical History:  Procedure Laterality Date  . ARTHROSCOPY KNEE W/ DRILLING    . BREATH TEK H PYLORI N/A 12/23/2012   Procedure: BREATH TEK H PYLORI;  Surgeon: Gayland Curry, MD;  Location: [facility];  Service: General;  Laterality: N/A;  . CHOLECYSTECTOMY  1989  . COLONOSCOPY N/A 07/31/2013   Procedure: COLONOSCOPY;  Surgeon: Gatha Mayer, MD;  Location: [place];  Service: Endoscopy;  Laterality: N/A;  . ESOPHAGOGASTRODUODENOSCOPY N/A 07/22/2013   Procedure:  ESOPHAGOGASTRODUODENOSCOPY (EGD);  Surgeon: Irene Shipper, MD;  [facility] END[facility]COPY;  Service: Endoscopy;  Laterality: N/A;  . ESOPHAGOGASTRODUODENOSCOPY N/A 07/31/2013   Procedure: ESOPHAGOGASTRODUODENOSCOPY (EGD);  Surgeon: Gatha Mayer, MD;  Location: [facility] ENDOSCOPY;  Service: Endoscopy;  Laterality: N/A;  . INTRAOPERATIVE TRANSESOPHAGEAL ECHOCARDIOGRAM N/A 07/18/2013   Procedure: INTRAOPERATIVE TRANSESOPHAGEAL ECHOCARDIOGRAM;  Surgeon: Grace Isaac, MD;  Location: [place];  Service: Open Heart Surgery;  Laterality: N/A;  . LAPAROSCOPIC GASTRIC BANDING WITH HIATAL HERNIA REPAIR N/A 04/08/2013   Procedure: LAPAROSCOPIC GASTRIC BANDING WITH possible  HIATAL HERNIA REPAIR;  Surgeon: Gayland Curry, MD;  Location: [facility];  Service: General;  Laterality: N/A;  . LITHOTRIPSY    . renal calculi    . SUBXYPHOID PERICARDIAL WINDOW N/A 07/18/2013   Procedure: SUBXYPHOID PERICARDIAL WINDOW;  Surgeon: Grace Isaac, MD;  Location: [place];  Service: Thoracic;  Laterality: N/A;  . TONSILLECTOMY    . total knee replacment     bilat  . trigger finger repair     also lue, cts surgically repaired  . WISDOM TOOTH EXTRACTION      There were no vitals filed for this visit.      Subjective Assessment - 10/30/16 1407    Subjective No new complaints. No new falls. Headache is gone and has been for a few days. Just noticed yesterday he has  scrapes on his right forearm and a bruise on the left forearm from the fall   Patient is accompained by: Family member   Pertinent History asthma, basal cell carcinoma, obesity, gout, depression, HTN, OSA, peripheral neuropathy, CHF, PAF, PE, bil TKA   Limitations Walking   Currently in Pain? Yes   Pain Score 2    Pain Location Back   Pain Orientation Lower   Pain Descriptors / Indicators Aching;Sore   Pain Type Chronic pain   Pain Onset More than a month ago   Pain Frequency Intermittent   Aggravating Factors  prolonged standing   Pain Relieving Factors sitting,  tylenol             [facility] Adult PT Treatment/Exercise - 10/30/16 1421      Transfers   Transfers Sit to Stand;Stand to Sit   Sit to Stand 5: Supervision;With upper extremity assist;From chair/3-in-1;From bed   Sit to Stand Details Verbal cues for sequencing;Verbal cues for safe use of DME/AE   Stand to Sit 5: Supervision;With upper extremity assist;To chair/3-in-1;To bed   Stand to Sit Details (indicate cue type and reason) Verbal cues for sequencing;Verbal cues for precautions/safety     Ambulation/Gait   Ambulation/Gait Yes   Ambulation/Gait Assistance 4: Min guard   Ambulation Distance (Feet) 500 Feet   Assistive device Small based quad cane   Gait Pattern Step-through pattern;Decreased stance time - left;Decreased step length - right;Wide base of support;Decreased arm swing - left;Decreased weight shift to left   Ambulation Surface Level;Unlevel;Indoor;Outdoor;Paved   Stairs Yes   Stairs Assistance 4: Min guard;5: Supervision   Stair Management Technique One rail Right;One rail Left;Alternating pattern;Forwards;With cane   Number of Stairs 4  x2 reps   Height of Stairs 6             Balance Exercises - 10/30/16 1428      Balance Exercises: Standing   Standing Eyes Closed Wide (BOA);Head turns;Foam/compliant surface;Other reps (comment);20 secs;Limitations   Balance Beam standing with feet across blue foam beam: alternating fwd stepping to floor and back onto beam x 10 each side, then alternating backward stepping to floor and back onto beam x 10 each side. light fingertip touch on bars with cues on step length, step height, and weight shifting.     Balance Exercises: Standing   Standing Eyes Closed Limitations standing across blue foam beam: EC no head movements progressing to EC head movements left<>right and up<>down with light UE support on bars and min guard to min assist for balance.              PT Short Term Goals - 08/08/16 1452      PT SHORT TERM  GOAL #1   Title verbalize understanding of CVA risk factors/warning signs (08/10/16)   Status Achieved     PT SHORT TERM GOAL #2   Title improve timed up and go to < 30 sec for improved mobility (08/10/16)   Status Achieved     PT SHORT TERM GOAL #3   Title improve gait velocity to > 1.32 ft/sec for improved mobility and community access (08/10/16)   Status Achieved     PT SHORT TERM GOAL #4   Title amb > 300' on level indoor/paved outdoor surfaces with LRAD with supervision for improved community access (08/10/16)   Status Achieved     PT SHORT TERM GOAL #5   Title perform BERG with appropriate LTG to be written (08/10/16)   Status Achieved  PT Long Term Goals - 10/30/16 1411      PT LONG TERM GOAL #1   Title independent with HEP (NEW TARGET DATE OF 11/03/2016)   Time --   Period Weeks   Status On-going     PT LONG TERM GOAL #2   Title improve timed up and go to < 21 sec with LRAD for improved mobility (09/07/16)   Baseline with RW 12.34 seconds; 12.22 seconds with quad cane    Status Achieved     PT LONG TERM GOAL #3   Title improve gait velocity to > 1.8 ft/sec for improved mobility and decreased fall risk (09/07/16)   Baseline 2.65f/sec with quad cane, 2.839fsec with rollator   Status Achieved     PT LONG TERM GOAL #4   Title amb > 500' on various indoor/outdoor surfaces and will negotiate up/down 7 stairs with cane and rail with supervision for improved independence and mobility (11/03/16)   Baseline 10/31/16: 500 with small based quad cane with min guard assist, up/down 8 stairs with rail/cane combo progressing from min guard assist to supervision   Time --   Period --   Status Partially Met     PT LONG TERM GOAL #5   Title Pt will decrease falls risk as indicated by an increase in BERG balance score to > or = 45/56 (11/03/16)   Baseline 42/56 on 09/22/16; 50/56 on 10/27/16   Status Achieved     PT LONG TERM GOAL #6   Title Pt will improve gait velocity with  quad cane to > or = [id number] for decreased falls risk during household and limited community ambulation with LRAD (11/03/16)   Baseline 2.75 ft/sec with quad cane on 6/22; 3.8 ft/sec with cane   Period --   Status Achieved            Plan - 10/30/16 1643    Clinical Impression Statement Pt has partially met his remaining LTG (except for LTG #1 which is ongoing as his HEP is updated as needed, he is I with his current one). Pt continues to be challenged on complaint surfaces and when vision is removed. Pt should benefit from continued PT to progress toward unmet goals.    Rehab Potential Good   Clinical Impairments Affecting Rehab Potential multiple comorbidities   PT Frequency 2x / week   PT Duration 8 weeks   PT Treatment/Interventions ADLs/Self Care Home Management;Cryotherapy;Electrical Stimulation;Neuromuscular re-education;Balance training;Therapeutic exercise;Therapeutic activities;Functional mobility training;Gait training;Stair training;Patient/family education;Orthotic Fit/Training;Manual techniques;Vestibular;Energy conservation;Moist Heat;DME Instruction   PT Next Visit Plan Reciprocal stepping on stairs and work towards stepping up with cane only, SLS, endurance with walking on uneven/compliant surfaces, reaching to floor for objects and maintaining balance   Consulted and Agree with Plan of Care Patient;Family member/caregiver   Family Member Consulted wife      Patient will benefit from skilled therapeutic intervention in order to improve the following deficits and impairments:  Abnormal gait, Decreased balance, Decreased activity tolerance, Decreased strength, Postural dysfunction, Obesity, Decreased endurance, Decreased knowledge of use of DME, Decreased mobility, Difficulty walking, Dizziness, Decreased coordination, Impaired sensation, Impaired vision/preception  Visit Diagnosis: Hemiplegia and hemiparesis following cerebral infarction affecting left non-dominant side  (HCC)  Other abnormalities of gait and mobility  Muscle weakness (generalized)  Unsteadiness on feet     Problem List Patient Active Problem List   Diagnosis Date Noted  . Benign essential HTN   . 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 [place]olic cerebral infarction ([place]/21/2018  .  Stroke syndrome ([place])   . OSA (obstructive sleep apnea)   . Dysphagia, post-stroke   . Neurologic gait disorder   . Encounter for monitoring flecainide therapy 04/28/2016  . Recurrent pulmonary emboli ([place]) 06/13/2015  . GERD (gastroesophageal reflux disease) 06/13/2015  . Basal cell carcinoma of skin 06/28/2014  . 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 PAF (paroxysmal atrial fibrillation) ([place]) 07/29/2013  . Chronic anticoagulation 07/22/2013  . Gastric ulcer with hemorrhage 07/20/13 07/22/2013  . Diastolic congestive heart failure ([place]) 07/16/2013  . H/O laparoscopic adjustable gastric banding & hiatal hernia repair 04/08/13 04/23/2013  . Prediabetes 02/05/2013  . TMJ (temporomandibular joint syndrome) 09/11/2012  . Morbid obesity ([place]) 08/09/2012  . Peripheral neuropathy ([place]) 11/02/2009  . Hyperlipidemia with target LDL less than 130 09/10/2008  . GOUT 06/28/2006  . BPH (benign prostatic hyperplasia) 06/28/2006    Willow Ora, PTA, [facility] [address], [place], [place] [address] [phone] 10/31/16, 4:50 PM   Name: [name] MRN: [id number] Date of Birth: 1945-12-23

## 2016-11-01 ENCOUNTER — Encounter: Payer: Self-pay | Admitting: Physical Therapy

## 2016-11-01 ENCOUNTER — Ambulatory Visit: Payer: Medicare Other | Attending: Physical Medicine & Rehabilitation | Admitting: Physical Therapy

## 2016-11-01 DIAGNOSIS — M6281 Muscle weakness (generalized): Secondary | ICD-10-CM | POA: Diagnosis not present

## 2016-11-01 DIAGNOSIS — R2689 Other abnormalities of gait and mobility: Secondary | ICD-10-CM | POA: Diagnosis not present

## 2016-11-01 DIAGNOSIS — I69354 Hemiplegia and hemiparesis following cerebral infarction affecting left non-dominant side: Secondary | ICD-10-CM

## 2016-11-01 DIAGNOSIS — R2681 Unsteadiness on feet: Secondary | ICD-10-CM | POA: Diagnosis not present

## 2016-11-02 NOTE — Therapy (Addendum)
[facility] [address] [place], [place], [address] Phone: [phone]   Fax:  [phone]  Physical Therapy Treatment  Patient Details  Name: [name] MRN: [id number] Date of Birth: 1945-04-16 Referring Provider: Dr. Alysia Penna  Encounter Date: 11/01/2016      PT End of Session - 11/01/16 1411    Visit Number 32   Number of Visits 34   Date for PT Re-Evaluation 11/03/16   Authorization Type Medicare-G Codes and Progress Notes every 10th visit   PT Start Time 1404   PT Stop Time 1445   PT Time Calculation (min) 41 min   Equipment Utilized During Treatment Gait belt   Activity Tolerance Patient tolerated treatment well   Behavior During Therapy [facility] for tasks assessed/performed      Past Medical History:  Diagnosis Date  . Anemia 07/29/2013  . Asthma    childhood asthma  . Benign prostatic hypertrophy    several prostate biopsies neg for cancer.   . Bilateral hip pain   . Cancer ([place])   . CHF (congestive heart failure) ([place])   . Complication of anesthesia    MORPHINE CAUSES HALLUCINATIONS  . CVA (cerebral vascular accident) ([place]) 06/2016  . Depression   . FH: colonic polyps 2010  . Gout   . History of kidney stones   . Hypertension   . Kidney cysts   . Morbid obesity ([place])   . Numbness    feet / legs  . PAF (paroxysmal atrial fibrillation) with RVR 07/29/2013  . Sleep apnea    USES C PAP    Past Surgical History:  Procedure Laterality Date  . ARTHROSCOPY KNEE W/ DRILLING    . BREATH TEK H PYLORI N/A 12/23/2012   Procedure: BREATH TEK H PYLORI;  Surgeon: Gayland Curry, MD;  Location: [facility];  Service: General;  Laterality: N/A;  . CHOLECYSTECTOMY  1989  . COLONOSCOPY N/A 07/31/2013   Procedure: COLONOSCOPY;  Surgeon: Gatha Mayer, MD;  Location: [place];  Service: Endoscopy;  Laterality: N/A;  . ESOPHAGOGASTRODUODENOSCOPY N/A 07/22/2013   Procedure:  ESOPHAGOGASTRODUODENOSCOPY (EGD);  Surgeon: Irene Shipper, MD;  Location: [facility] ENDOSCOPY;  Service: Endoscopy;  Laterality: N/A;  . ESOPHAGOGASTRODUODENOSCOPY N/A 07/31/2013   Procedure: ESOPHAGOGASTRODUODENOSCOPY (EGD);  Surgeon: Gatha Mayer, MD;  Location: [facility] ENDOSCOPY;  Service: Endoscopy;  Laterality: N/A;  . INTRAOPERATIVE TRANSESOPHAGEAL ECHOCARDIOGRAM N/A 07/18/2013   Procedure: INTRAOPERATIVE TRANSESOPHAGEAL ECHOCARDIOGRAM;  Surgeon: Grace Isaac, MD;  Location: [place];  Service: Open Heart Surgery;  Laterality: N/A;  . LAPAROSCOPIC GASTRIC BANDING WITH HIATAL HERNIA REPAIR N/A 04/08/2013   Procedure: LAPAROSCOPIC GASTRIC BANDING WITH possible  HIATAL HERNIA REPAIR;  Surgeon: Gayland Curry, MD;  [facility];  Service: General;  Laterality: N/A;  . LITHOTRIPSY    . renal calculi    . SUBXYPHOID PERICARDIAL WINDOW N/A 07/18/2013   Procedure: SUBXYPHOID PERICARDIAL WINDOW;  Surgeon: Grace Isaac, MD;  Location: [place];  Service: Thoracic;  Laterality: N/A;  . TONSILLECTOMY    . total knee replacment     bilat  . trigger finger repair     also lue, cts surgically repaired  . WISDOM TOOTH EXTRACTION      There were no vitals filed for this visit.      Subjective Assessment - 11/01/16 1407    Subjective Feels his breathing is not getting any better. Reports even the act of taking a shower fatigues him. No falls to  report. Headache pain is gone, still with some low back pain.    Patient is accompained by: Family member   Pertinent History asthma, basal cell carcinoma, obesity, gout, depression, HTN, OSA, peripheral neuropathy, CHF, PAF, PE, bil TKA   Limitations Walking   Patient Stated Goals improve walking, balance, wants to drive again and be independent   Currently in Pain? No/denies   Pain Score 0-No pain  up to a 4-5/10 with activity in back             [facility] Adult PT Treatment/Exercise - 11/01/16 1413      Transfers   Transfers Sit to Stand;Stand to Sit    Sit to Stand 5: Supervision;With upper extremity assist;From chair/3-in-1;From bed   Stand to Sit 5: Supervision;With upper extremity assist;To chair/3-in-1;To bed     Ambulation/Gait   Ambulation/Gait Yes   Ambulation/Gait Assistance 5: Supervision;4: Min guard   Ambulation/Gait Assistance Details min guard when fatigued for balance stability   Ambulation Distance (Feet) --  around gym with activity   Assistive device Small based quad cane   Gait Pattern Step-through pattern;Decreased stance time - left;Decreased step length - right;Wide base of support;Decreased arm swing - left;Decreased weight shift to left   Ambulation Surface Level;Indoor     High Level Balance   High Level Balance Activities Marching forwards;Marching backwards   High Level Balance Comments on blue mat in parallel bars with min assist for balance and occasional touch to bars for balance x 3 laps each way.      Knee/Hip Exercises: Aerobic   Other Aerobic Scifit level 4.5 x 10 minutes with goal rpm >/= 65 for strengthening and activity tolerance             Balance Exercises - 11/01/16 1504      Balance Exercises: Standing   Standing Eyes Closed Narrow base of support (BOS);Head turns;Foam/compliant surface;Other reps (comment);30 secs;Limitations   SLS with Vectors Solid surface;Other reps (comment);Limitations     Balance Exercises: Standing   Standing Eyes Closed Limitations standing on doubled blue mat with feet together: EC no head movements, progressing to EC head movements left<>right,  up<>down, and diagonals both ways x 10 reps with min guard assist up to mod assist for balance. cues on weight shifitng and upright posture to assist with balance as well.                       SLS with Vectors Limitations 3 foam bubbles in row in front of pt: toe taps to each x 3 full laps with each foot. 1 episode of significant balance loss with pt sitting on mat assisted by PTA.              PT Short Term  Goals - 08/08/16 1452      PT SHORT TERM GOAL #1   Title verbalize understanding of CVA risk factors/warning signs (08/10/16)   Status Achieved     PT SHORT TERM GOAL #2   Title improve timed up and go to < 30 sec for improved mobility (08/10/16)   Status Achieved     PT SHORT TERM GOAL #3   Title improve gait velocity to > 1.32 ft/sec for improved mobility and community access (08/10/16)   Status Achieved     PT SHORT TERM GOAL #4   Title amb > 300' on level indoor/paved outdoor surfaces with LRAD with supervision for improved community access (08/10/16)   Status Achieved  PT SHORT TERM GOAL #5   Title perform BERG with appropriate LTG to be written (08/10/16)   Status Achieved           PT Long Term Goals - 11/01/16 1413      PT LONG TERM GOAL #1   Title independent with HEP (NEW TARGET DATE OF 11/03/2016)   Baseline 11/01/16: met with current HEP   Status Achieved     PT LONG TERM GOAL #2   Title improve timed up and go to < 21 sec with LRAD for improved mobility (09/07/16)   Baseline with RW 12.34 seconds; 12.22 seconds with quad cane    Status Achieved     PT LONG TERM GOAL #3   Title improve gait velocity to > 1.8 ft/sec for improved mobility and decreased fall risk (09/07/16)   Baseline 2.26f/sec with quad cane, 2.8109fsec with rollator   Status Achieved     PT LONG TERM GOAL #4   Title amb > 500' on various indoor/outdoor surfaces and will negotiate up/down 7 stairs with cane and rail with supervision for improved independence and mobility (11/03/16)   Baseline 10/31/16: 500 with small based quad cane with min guard assist, up/down 8 stairs with rail/cane combo progressing from min guard assist to supervision   Status Partially Met     PT LONG TERM GOAL #5   Title Pt will decrease falls risk as indicated by an increase in BERG balance score to > or = 45/56 (11/03/16)   Baseline 42/56 on 09/22/16; 50/56 on 10/27/16   Status Achieved     PT LONG TERM GOAL #6   Title Pt  will improve gait velocity with quad cane to > or = 2.33f54fec for decreased falls risk during household and limited community ambulation with LRAD (11/03/16)   Baseline 2.75 ft/sec with quad cane on 6/22; 3.8 ft/sec with cane   Status Achieved      New LTG entered by PT:     PT Long Term Goals - 11/03/16 1139      PT LONG TERM GOAL #1   Title independent with HEP    Time 4   Period Weeks   Status Revised   Target Date 12/03/16     PT LONG TERM GOAL #2   Title TUG no longer indicated-pt is below cutoff for increased falls risk     PT LONG TERM GOAL #3   Title Pt will improve DGI score to > or = 19/24 to decrease falls risk during community gait   Baseline TBD   Time 4   Period Weeks   Status New   Target Date 12/03/16     PT LONG TERM GOAL #4   Title amb > 500' on various indoor/outdoor surfaces and will negotiate up/down 7 stairs with cane and rail with supervision for improved independence and mobility   Time 4   Period Weeks   Status Revised   Target Date 12/03/16     PT LONG TERM GOAL #5   Title Pt will decrease falls risk as indicated by an increase in BERG balance score to > or = 53/56    Baseline 50/56 on 10/27/16   Time 4   Period Weeks   Status Revised   Target Date 12/03/16     Additional Long Term Goals   Additional Long Term Goals Yes     PT LONG TERM GOAL #6   Title Gait velocity WFL-no goal indicated     PT  LONG TERM GOAL #7   Title Pt will improve endurance during functional mobility as indicated by improvement in 6 min walk test by 200'   Baseline 1030   Time 4   Period Weeks   Status New   Target Date 12/03/16     Raylene Everts, PT, DPT 11/03/16    11:57 AM        11/01/16 1411  Plan  Clinical Impression Statement Pt is making good progress and has met all LTG except gait outside 500' and stair negotiation with cane with supervision.  Pt continues to require intermittent min A for balance and is limited by DOE and impaired endurance.  Pt  has progressed to using a cane consistently and has significantly decreased his falls risk.  Pt would benefit from ongoing skilled PT services to continue to address impairments in vestibular system, impaired postural control/midline orientation, balance, gait and activity tolerance/endurance.  Plan to recertify x 60 days but anticipate pt will re-assess goals at 4 to determine if pt is safe to D/C or will require full time.  Pt will benefit from skilled therapeutic intervention in order to improve on the following deficits Abnormal gait;Decreased balance;Decreased activity tolerance;Decreased strength;Postural dysfunction;Obesity;Decreased endurance;Decreased knowledge of use of DME;Decreased mobility;Difficulty walking;Dizziness;Decreased coordination;Impaired sensation;Impaired vision/preception;Cardiopulmonary status limiting activity  Rehab Potential Good  Clinical Impairments Affecting Rehab Potential multiple comorbidities  PT Frequency 2x / week  PT Duration Other (comment) (60 day recertification; reassess at 4 weeks)  PT Treatment/Interventions ADLs/Self Care Home Management;Cryotherapy;Electrical Stimulation;Neuromuscular re-education;Balance training;Therapeutic exercise;Therapeutic activities;Functional mobility training;Gait training;Stair training;Patient/family education;Orthotic Fit/Training;Manual techniques;Vestibular;Energy conservation;Moist Heat;DME Instruction  PT Next Visit Plan ASSESS DGI and revise LTG; Reciprocal stepping on stairs and work towards stepping up with cane only, SLS, endurance with walking on uneven/compliant surfaces, reaching to floor for objects and maintaining balance  Consulted and Agree with Plan of Care Patient;Family member/caregiver  Family Member Consulted wife   Impression statement entered and plan updated by PT: Raylene Everts, PT, DPT 11/03/16    11:56 AM    Patient will benefit from skilled therapeutic intervention in order to improve the  following deficits and impairments:  Abnormal gait, Decreased balance, Decreased activity tolerance, Decreased strength, Postural dysfunction, Obesity, Decreased endurance, Decreased knowledge of use of DME, Decreased mobility, Difficulty walking, Dizziness, Decreased coordination, Impaired sensation, Impaired vision/preception  Visit Diagnosis: Hemiplegia and hemiparesis following cerebral infarction affecting left non-dominant side (HCC)  Other abnormalities of gait and mobility  Muscle weakness (generalized)  Unsteadiness on feet     Problem List Patient Active Problem List   Diagnosis Date Noted  . Benign essential HTN   . 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 Embolic cerebral infarction ([place]) 06/21/2016  . Stroke syndrome ([place])   . OSA (obstructive sleep apnea)   . Dysphagia, post-stroke   . Neurologic gait disorder   . Encounter for monitoring flecainide therapy 04/28/2016  . Recurrent pulmonary emboli ([place]) 06/13/2015  . GERD (gastroesophageal reflux disease) 06/13/2015  . Basal cell carcinoma of skin 06/28/2014  . 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 PAF (paroxysmal atrial fibrillation) ([place]) 07/29/2013  . Chronic anticoagulation 07/22/2013  . Gastric ulcer with hemorrhage 07/20/13 07/22/2013  . Diastolic congestive heart failure ([place]) 07/16/2013  . H/O laparoscopic adjustable gastric banding & hiatal hernia repair 04/08/13 04/23/2013  . Prediabetes 02/05/2013  . TMJ (temporomandibular joint syndrome) 09/11/2012  . Morbid obesity ([place]) 08/09/2012  . Peripheral neuropathy ([place]) 11/02/2009  . Hyperlipidemia with target LDL less than 130 09/10/2008  . GOUT  06/28/2006  . BPH (benign prostatic hyperplasia) 06/28/2006    Willow Ora, PTA, [facility] [address], [place], [place] [address] [phone] 11/02/16, 1:04 PM   Name: [name] MRN: [id number] Date of Birth: 05-13-1945

## 2016-11-03 ENCOUNTER — Encounter: Payer: Self-pay | Admitting: Physical Medicine & Rehabilitation

## 2016-11-03 ENCOUNTER — Ambulatory Visit (HOSPITAL_BASED_OUTPATIENT_CLINIC_OR_DEPARTMENT_OTHER): Payer: Medicare Other | Admitting: Physical Medicine & Rehabilitation

## 2016-11-03 ENCOUNTER — Encounter: Payer: Medicare Other | Attending: Physical Medicine & Rehabilitation

## 2016-11-03 VITALS — BP 116/76 | HR 71 | Resp 14

## 2016-11-03 DIAGNOSIS — I4891 Unspecified atrial fibrillation: Secondary | ICD-10-CM | POA: Insufficient documentation

## 2016-11-03 DIAGNOSIS — M5440 Lumbago with sciatica, unspecified side: Secondary | ICD-10-CM

## 2016-11-03 DIAGNOSIS — I10 Essential (primary) hypertension: Secondary | ICD-10-CM | POA: Diagnosis not present

## 2016-11-03 DIAGNOSIS — G4733 Obstructive sleep apnea (adult) (pediatric): Secondary | ICD-10-CM | POA: Insufficient documentation

## 2016-11-03 DIAGNOSIS — G463 Brain stem stroke syndrome: Secondary | ICD-10-CM | POA: Insufficient documentation

## 2016-11-03 DIAGNOSIS — G8929 Other chronic pain: Secondary | ICD-10-CM | POA: Insufficient documentation

## 2016-11-03 DIAGNOSIS — Z7901 Long term (current) use of anticoagulants: Secondary | ICD-10-CM | POA: Insufficient documentation

## 2016-11-03 NOTE — Progress Notes (Signed)
Subjective:    Patient ID: [name], male    DOB: October 01, 1945, 71 y.o.   MRN: [id number]  HPI Seen by PCP ~1wk ago THyroid study and BMET were normal   Chronic low back pain exacerbated by standing for prolonged per to time. He is to have pain radiating into his right leg but no longer has this. Has right-sided low back pain greater than left Patient has had back pain since the eighth grade. He had a recent fall about a week ago but did not injure his back. Seen by orthopedic surgery, had x-rays 2 or more years ago. No further workup or treatment was performed after that.  In terms of his stroke symptoms. He still feels fatigue as well as vocal hoarseness and some blurring of vision with prolonged reading. The blurring of vision bothers him the most.  Pain Inventory Average Pain 1 Pain Right Now 1 My pain is very little pain  In the last 24 hours, has pain interfered with the following? General activity 1 Relation with others 1 Enjoyment of life 1 What TIME of day is your pain at its worst? daytime Sleep (in general) Negative  Pain is worse with: walking and standing Pain improves with: medication Relief from Meds: 1  Mobility walk with assistance use a cane Do you have any goals in this area?  yes  Function retired Do you have any goals in this area?  no  Neuro/Psych weakness numbness tingling  Prior Studies Any changes since last visit?  no  Physicians involved in your care Any changes since last visit?  no   Family History  Problem Relation Age of Onset  . Depression Mother   . Hypertension Mother   . Dementia Mother   . Heart disease Father        MI  . Depression Brother   . Stroke Paternal Aunt        CVA  . Diabetes Paternal Uncle   . Heart disease Paternal Uncle        MI  . Heart disease Paternal Grandmother        MI  . Diabetes Cousin        PATERNAL   Social History   Social History  . Marital status: Married    Spouse  name: N/A  . Number of children: 1  . Years of education: N/A   Occupational History  . retired-Manager [employer]    Social History Main Topics  . Smoking status: Former Smoker    Packs/day: 0.20    Years: 13.00    Types: Cigarettes    Start date: 04/03/1961    Quit date: 08/30/1974  . Smokeless tobacco: Never Used  . Alcohol use No     Comment: Occasionally; 1 small drink a week  . Drug use: No  . Sexual activity: Not Currently   Other Topics Concern  . None   Social History Narrative   Lives at home w/ his wife   Right-handed   Caffeine: decaf coffee, occasional tea   Past Surgical History:  Procedure Laterality Date  . ARTHROSCOPY KNEE W/ DRILLING    . BREATH TEK H PYLORI N/A 12/23/2012   Procedure: BREATH TEK H PYLORI;  Surgeon: Gayland Curry, MD;  Location: [facility];  Service: General;  Laterality: N/A;  . CHOLECYSTECTOMY  1989  . COLONOSCOPY N/A 07/31/2013   Procedure: COLONOSCOPY;  Surgeon: Gatha Mayer, MD;  Location: [place];  Service: Endoscopy;  Laterality:  N/A;  . ESOPHAGOGASTRODUODENOSCOPY N/A 07/22/2013   Procedure: ESOPHAGOGASTRODUODENOSCOPY (EGD);  Surgeon: Irene Shipper, MD;  Location: [facility] ENDOSCOPY;  Service: Endoscopy;  Laterality: N/A;  . ESOPHAGOGASTRODUODENOSCOPY N/A 07/31/2013   Procedure: ESOPHAGOGASTRODUODENOSCOPY (EGD);  Surgeon: Gatha Mayer, MD;  Location: [facility] ENDOSCOPY;  Service: Endoscopy;  Laterality: N/A;  . INTRAOPERATIVE TRANSESOPHAGEAL ECHOCARDIOGRAM N/A 07/18/2013   Procedure: INTRAOPERATIVE TRANSESOPHAGEAL ECHOCARDIOGRAM;  Surgeon: Grace Isaac, MD;  Location: [place];  Service: Open Heart Surgery;  Laterality: N/A;  . LAPAROSCOPIC GASTRIC BANDING WITH HIATAL HERNIA REPAIR N/A 04/08/2013   Procedure: LAPAROSCOPIC GASTRIC BANDING WITH possible  HIATAL HERNIA REPAIR;  Surgeon: Gayland Curry, MD;  [facility];  Service: General;  Laterality: N/A;  . LITHOTRIPSY    . renal calculi    . SUBXYPHOID PERICARDIAL WINDOW N/A 07/18/2013     Procedure: SUBXYPHOID PERICARDIAL WINDOW;  Surgeon: Grace Isaac, MD;  Location: [place];  Service: Thoracic;  Laterality: N/A;  . TONSILLECTOMY    . total knee replacment     bilat  . trigger finger repair     also lue, cts surgically repaired  . WISDOM TOOTH EXTRACTION     Past Medical History:  Diagnosis Date  . Anemia 07/29/2013  . Asthma    childhood asthma  . Benign prostatic hypertrophy    several prostate biopsies neg for cancer.   . Bilateral hip pain   . Cancer ([place])   . CHF (congestive heart failure) ([place])   . Complication of anesthesia    MORPHINE CAUSES HALLUCINATIONS  . CVA (cerebral vascular accident) ([place]) 06/2016  . Depression   . FH: colonic polyps 2010  . Gout   . History of kidney stones   . Hypertension   . Kidney cysts   . Morbid obesity ([place])   . Numbness    feet / legs  . PAF (paroxysmal atrial fibrillation) with RVR 07/29/2013  . Sleep apnea    USES C PAP   BP 116/76 (BP Location: Right Arm, Patient Position: Sitting, Cuff Size: Normal)   Pulse 71   Resp 14   SpO2 98%   Opioid Risk Score:   Fall Risk Score:  `1  Depression screen PHQ 2/9  Depression screen [facility] 2/9 07/28/2016 02/16/2015 03/30/2014  Decreased Interest 0 0 0  Down, Depressed, Hopeless 0 0 0  PHQ - 2 Score 0 0 0  Altered sleeping 0 - -  Tired, decreased energy 1 - -  Change in appetite 0 - -  Feeling bad or failure about yourself  0 - -  Trouble concentrating 1 - -  Moving slowly or fidgety/restless 0 - -  PHQ-9 Score 2 - -  Difficult doing work/chores Not difficult at all - -  Some recent data might be hidden    Review of Systems  HENT: Negative.   Eyes: Negative.   Respiratory: Positive for apnea and shortness of breath.   Cardiovascular: Negative.   Gastrointestinal: Negative.   Endocrine: Negative.   Genitourinary: Negative.   Musculoskeletal: Positive for gait problem.  Skin: Positive for pallor.  Allergic/Immunologic: Negative.   Neurological:  Positive for weakness and numbness.  Hematological: Negative.   Psychiatric/Behavioral: Negative.   All other systems reviewed and are negative.      Objective:   Physical Exam  Constitutional: He is oriented to person, place, and time. He appears well-developed and well-nourished.  HENT:  Head: Normocephalic and atraumatic.  Eyes: Pupils are equal, round, and reactive to light. Conjunctivae  and EOM are normal.  Neck: Normal range of motion. Neck supple.  Neurological: He is alert and oriented to person, place, and time.  Psychiatric: He has a normal mood and affect.  Nursing note and vitals reviewed.  Lumbar spine has mild tenderness to palpation in the lumbosacral junction  Visual fields are intact. Motor strength is 5/5 bilateral deltoid, biceps, triceps, grip, hip flexor, knee extensor, ankle dorsal flexor. Ambulates without assistive device short distances, wide basis support. Able to stand without lateral propulsion. No evidence of dysmetria, finger-nose-finger testing, bilateral upper limbs, lower limb heel to shin limited by body habitus.  There is no evidence of facial droop Vocal hoarseness noted      Assessment & Plan:  1.  Wallenberg's syndrome, very good recovery, discussed this with patient, residual balance deficit, which I think will be permanent. Visual blurring with prolonged reading may or may not be related to the current stroke, but would recommend neuro ophthalmology evaluation.  May also benefit from ENT eval  2. Chronic low back pain, no sciatic symptoms. Checking lumbar spine x-rays. Will ask physical therapy to evaluate and provide some stabilization exercises

## 2016-11-03 NOTE — Patient Instructions (Signed)
[facility]  Dr Spencer [address] [place] [address] 

## 2016-11-03 NOTE — Addendum Note (Signed)
Addended by: Raylene Everts F on: 11/03/2016 12:03 PM   Modules accepted: Orders

## 2016-11-07 ENCOUNTER — Ambulatory Visit: Payer: Medicare Other | Admitting: Physical Therapy

## 2016-11-07 ENCOUNTER — Ambulatory Visit
Admission: RE | Admit: 2016-11-07 | Discharge: 2016-11-07 | Disposition: A | Discharge status: Home / Self Care | Payer: Medicare Other | Source: Ambulatory Visit | Attending: Physical Medicine & Rehabilitation | Admitting: Physical Medicine & Rehabilitation

## 2016-11-07 ENCOUNTER — Encounter: Payer: Self-pay | Admitting: Physical Therapy

## 2016-11-07 DIAGNOSIS — G8929 Other chronic pain: Secondary | ICD-10-CM

## 2016-11-07 DIAGNOSIS — R2689 Other abnormalities of gait and mobility: Secondary | ICD-10-CM | POA: Diagnosis not present

## 2016-11-07 DIAGNOSIS — M5136 Other intervertebral disc degeneration, lumbar region: Secondary | ICD-10-CM | POA: Diagnosis not present

## 2016-11-07 DIAGNOSIS — M47816 Spondylosis without myelopathy or radiculopathy, lumbar region: Secondary | ICD-10-CM | POA: Diagnosis not present

## 2016-11-07 DIAGNOSIS — I69354 Hemiplegia and hemiparesis following cerebral infarction affecting left non-dominant side: Secondary | ICD-10-CM | POA: Diagnosis not present

## 2016-11-07 DIAGNOSIS — M6281 Muscle weakness (generalized): Secondary | ICD-10-CM

## 2016-11-07 DIAGNOSIS — R2681 Unsteadiness on feet: Secondary | ICD-10-CM | POA: Diagnosis not present

## 2016-11-07 DIAGNOSIS — M5440 Lumbago with sciatica, unspecified side: Secondary | ICD-10-CM

## 2016-11-07 DIAGNOSIS — G463 Brain stem stroke syndrome: Secondary | ICD-10-CM

## 2016-11-07 NOTE — Patient Instructions (Addendum)
Functional Quadriceps: Sit to Stand    Sit on edge of chair, feet flat on floor. Stand upright, extending knees fully. Repeat _10__ times per set. Do _1_ sets per session. Do _1-2_ sessions per day.  http://orth.exer.us/734   Copyright  VHI. All rights reserved.     Functional Quadriceps: Chair Squat    Keeping feet flat on floor, shoulder width apart, squat as low as is comfortable with chair behind you and holding onto counter top for stability.  Repeat _10_ times per set. Do _1_ sets per session. Do _1-2_ sessions per day.  http://orth.exer.us/736   Copyright  VHI. All rights reserved.      Hip Side Kick    Holding a chair for balance, keep legs shoulder width apart and toes pointed forward. bring a leg out to side, keeping knee straight. Do not lean, slowly lower leg back down. Repeat using other leg, alternating legs.  Repeat _10_ times. Do _1-2_ sessions per day.  http://gt2.exer.us/342   Copyright  VHI. All rights reserved.

## 2016-11-08 NOTE — Therapy (Signed)
[facility] [address] [place], [place], [address] Phone: [phone]   Fax:  [phone]  Physical Therapy Treatment  Patient Details  Name: [name] MRN: [id number] Date of Birth: 1946-01-01 Referring Provider: Dr. Alysia Penna  Encounter Date: 11/07/2016   11/07/16 1410  PT Visits / Re-Eval  Visit Number 33  Number of Visits 37 (60 day recertification)  Date for PT Re-Evaluation 41/28/78 (60 day recert; check LTG at 4 weeks)  Authorization  Authorization Type Medicare-G Codes and Progress Notes every 10th visit  PT Time Calculation  PT Start Time 1402  PT Stop Time 1445  PT Time Calculation (min) 43 min  PT - End of Session  Equipment Utilized During Treatment Gait belt  Activity Tolerance Patient tolerated treatment well  Behavior During Therapy [facility] for tasks assessed/performed     Past Medical History:  Diagnosis Date  . Anemia 07/29/2013  . Asthma    childhood asthma  . Benign prostatic hypertrophy    several prostate biopsies neg for cancer.   . Bilateral hip pain   . Cancer ([place])   . CHF (congestive heart failure) ([place])   . Complication of anesthesia    MORPHINE CAUSES HALLUCINATIONS  . CVA (cerebral vascular accident) ([place]) 06/2016  . Depression   . FH: colonic polyps 2010  . Gout   . History of kidney stones   . Hypertension   . Kidney cysts   . Morbid obesity ([place])   . Numbness    feet / legs  . PAF (paroxysmal atrial fibrillation) with RVR 07/29/2013  . Sleep apnea    USES C PAP    Past Surgical History:  Procedure Laterality Date  . ARTHROSCOPY KNEE W/ DRILLING    . BREATH TEK H PYLORI N/A 12/23/2012   Procedure: BREATH TEK H PYLORI;  Surgeon: Gayland Curry, MD;  Location: [facility];  Service: General;  Laterality: N/A;  . CHOLECYSTECTOMY  1989  . COLONOSCOPY N/A 07/31/2013   Procedure: COLONOSCOPY;  Surgeon: Gatha Mayer, MD;  Location: [facility];  Service:  Endoscopy;  Laterality: N/A;  . ESOPHAGOGASTRODUODENOSCOPY N/A 07/22/2013   Procedure: ESOPHAGOGASTRODUODENOSCOPY (EGD);  Surgeon: Irene Shipper, MD;  Location[facility];  Service: Endoscopy;  Laterality: N/A;  . ESOPHAGOGASTRODUODENOSCOPY N/A 07/31/2013   Procedure: ESOPHAGOGASTRODUODENOSCOPY (EGD);  Surgeon: Gatha Mayer, MD;  Location: [facility] ENDOSCOPY;  Service: Endoscopy;  Laterality: N/A;  . INTRAOPERATIVE TRANSESOPHAGEAL ECHOCARDIOGRAM N/A 07/18/2013   Procedure: INTRAOPERATIVE TRANSESOPHAGEAL ECHOCARDIOGRAM;  Surgeon: Grace Isaac, MD;  Location: [facility];  Service: Open Heart Surgery;  Laterality: N/A;  . LAPAROSCOPIC GASTRIC BANDING WITH HIATAL HERNIA REPAIR N/A 04/08/2013   Procedure: LAPAROSCOPIC GASTRIC BANDING WITH possible  HIATAL HERNIA REPAIR;  Surgeon: Gayland Curry, MD;  Location: [facility];  Service: General;  Laterality: N/A;  . LITHOTRIPSY    . renal calculi    . SUBXYPHOID PERICARDIAL WINDOW N/A 07/18/2013   Procedure: SUBXYPHOID PERICARDIAL WINDOW;  Surgeon: Grace Isaac, MD;  Location: [facility];  Service: Thoracic;  Laterality: N/A;  . TONSILLECTOMY    . total knee replacment     bilat  . trigger finger repair     also lue, cts surgically repaired  . WISDOM TOOTH EXTRACTION      There were no vitals filed for this visit.     11/07/16 1406  Symptoms/Limitations  Subjective No new complaints. No falls to report.   Patient is accompained by: Family member  Pertinent  History asthma, basal cell carcinoma, obesity, gout, depression, HTN, OSA, peripheral neuropathy, CHF, PAF, PE, bil TKA  Limitations Walking  Patient Stated Goals improve walking, balance, wants to drive again and be independent  Pain Assessment  Currently in Pain? No/denies  Pain Score 0      11/07/16 1411  Transfers  Transfers Sit to Stand;Stand to Sit  Sit to Stand 5: Supervision;With upper extremity assist;From chair/3-in-1;From bed  Stand to Sit 5: Supervision;With upper extremity assist;To  chair/3-in-1;To bed  Neuro Re-ed   Neuro Re-ed Details  working in balance with retrieving items of floor: picking up bean bags with one hand support on knee while reaching with other hand to retrieve bean bag and then walking to put it into a basket x 7 bags with min guard assist and cues on technique.    Issued the following to HEP today with cues on ex form and technique. Functional Quadriceps: Sit to Stand    Sit on edge of chair, feet flat on floor. Stand upright, extending knees fully. Repeat _10__ times per set. Do _1_ sets per session. Do _1-2_ sessions per day.  http://orth.exer.us/734   Copyright  VHI. All rights reserved.     Functional Quadriceps: Chair Squat    Keeping feet flat on floor, shoulder width apart, squat as low as is comfortable with chair behind you and holding onto counter top for stability.  Repeat _10_ times per set. Do _1_ sets per session. Do _1-2_ sessions per day.  http://orth.exer.us/736   Copyright  VHI. All rights reserved.      Hip Side Kick    Holding a chair for balance, keep legs shoulder width apart and toes pointed forward. bring a leg out to side, keeping knee straight. Do not lean, slowly lower leg back down. Repeat using other leg, alternating legs.  Repeat _10_ times. Do _1-2_ sessions per day.  http://gt2.exer.us/342   Copyright  VHI. All rights reserved.       11/07/16 2308  PT Education  Education provided Yes  Education Details added to HEP for standing balance/strengthening  Person(s) Educated Patient;Spouse  Methods Explanation;Demonstration;Verbal cues;Handout  Comprehension Verbalized understanding;Returned demonstration;Verbal cues required;Need further instruction           PT Short Term Goals - 08/08/16 1452      PT SHORT TERM GOAL #1   Title verbalize understanding of CVA risk factors/warning signs (08/10/16)   Status Achieved     PT SHORT TERM GOAL #2   Title improve timed up and go to < 30  sec for improved mobility (08/10/16)   Status Achieved     PT SHORT TERM GOAL #3   Title improve gait velocity to > 1.32 ft/sec for improved mobility and community access (08/10/16)   Status Achieved     PT SHORT TERM GOAL #4   Title amb > 300' on level indoor/paved outdoor surfaces with LRAD with supervision for improved community access (08/10/16)   Status Achieved     PT SHORT TERM GOAL #5   Title perform BERG with appropriate LTG to be written (08/10/16)   Status Achieved           PT Long Term Goals - 11/03/16 1139      PT LONG TERM GOAL #1   Title independent with HEP    Time 4   Period Weeks   Status Revised   Target Date 12/03/16     PT LONG TERM GOAL #2   Title TUG no longer  indicated-pt is below cutoff for increased falls risk     PT LONG TERM GOAL #3   Title Pt will improve DGI score to > or = 19/24 to decrease falls risk during community gait   Baseline TBD   Time 4   Period Weeks   Status New   Target Date 12/03/16     PT LONG TERM GOAL #4   Title amb > 500' on various indoor/outdoor surfaces and will negotiate up/down 7 stairs with cane and rail with supervision for improved independence and mobility   Time 4   Period Weeks   Status Revised   Target Date 12/03/16     PT LONG TERM GOAL #5   Title Pt will decrease falls risk as indicated by an increase in BERG balance score to > or = 53/56    Baseline 50/56 on 10/27/16   Time 4   Period Weeks   Status Revised   Target Date 12/03/16     Additional Long Term Goals   Additional Long Term Goals Yes     PT LONG TERM GOAL #6   Title Gait velocity WFL-no goal indicated     PT LONG TERM GOAL #7   Title Pt will improve endurance during functional mobility as indicated by improvement in 6 min walk test by 200'   Baseline 1030   Time 4   Period Weeks   Status New   Target Date 12/03/16        11/07/16 1410  Plan  Clinical Impression Statement Today's skilled session focused on setting baseline  value for DGI and then on balance with reaching to floor to retrieve items from floor and on advancing HEP for strengthening/balance with no issues noted. Pt is progressing toward goals and should benefit from continued PT to progress toward unmet goalsl   Pt will benefit from skilled therapeutic intervention in order to improve on the following deficits Abnormal gait;Decreased balance;Decreased activity tolerance;Decreased strength;Postural dysfunction;Obesity;Decreased endurance;Decreased knowledge of use of DME;Decreased mobility;Difficulty walking;Dizziness;Decreased coordination;Impaired sensation;Impaired vision/preception;Cardiopulmonary status limiting activity  Rehab Potential Good  Clinical Impairments Affecting Rehab Potential multiple comorbidities  PT Frequency 2x / week  PT Duration Other (comment) (60 day recertification; reassess at 4 weeks)  PT Treatment/Interventions ADLs/Self Care Home Management;Cryotherapy;Electrical Stimulation;Neuromuscular re-education;Balance training;Therapeutic exercise;Therapeutic activities;Functional mobility training;Gait training;Stair training;Patient/family education;Orthotic Fit/Training;Manual techniques;Vestibular;Energy conservation;Moist Heat;DME Instruction  PT Next Visit Plan Reciprocal stepping on stairs and work towards stepping up with cane only, SLS, endurance with walking on uneven/compliant surfaces, reaching to floor for objects and maintaining balance  Consulted and Agree with Plan of Care Patient;Family member/caregiver  Family Member Consulted wife          Patient will benefit from skilled therapeutic intervention in order to improve the following deficits and impairments:  Abnormal gait, Decreased balance, Decreased activity tolerance, Decreased strength, Postural dysfunction, Obesity, Decreased endurance, Decreased knowledge of use of DME, Decreased mobility, Difficulty walking, Dizziness, Decreased coordination, Impaired  sensation, Impaired vision/preception, Cardiopulmonary status limiting activity  Visit Diagnosis: Other abnormalities of gait and mobility  Muscle weakness (generalized)     Problem List Patient Active Problem List   Diagnosis Date Noted  . Chronic right-sided low back pain with sciatica 11/03/2016  . Benign essential HTN   . 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 Embolic cerebral infarction ([place]) 06/21/2016  . Stroke syndrome ([place])   . OSA (obstructive sleep apnea)   . Dysphagia, post-stroke   . Neurologic gait disorder   . Encounter for monitoring flecainide therapy  04/28/2016  . Recurrent pulmonary emboli ([place]) 06/13/2015  . GERD (gastroesophageal reflux disease) 06/13/2015  . Basal cell carcinoma of skin 06/28/2014  . 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 PAF (paroxysmal atrial fibrillation) ([place]) 07/29/2013  . Chronic anticoagulation 07/22/2013  . Gastric ulcer with hemorrhage 07/20/13 07/22/2013  . Diastolic congestive heart failure ([place]) 07/16/2013  . H/O laparoscopic adjustable gastric banding & hiatal hernia repair 04/08/13 04/23/2013  . Prediabetes 02/05/2013  . TMJ (temporomandibular joint syndrome) 09/11/2012  . Morbid obesity ([place]) 08/09/2012  . Peripheral neuropathy ([place]) 11/02/2009  . Hyperlipidemia with target LDL less than 130 09/10/2008  . GOUT 06/28/2006  . BPH (benign prostatic hyperplasia) 06/28/2006    Willow Ora, PTA, [facility] [address] [place], [place] [address] [phone] 11/08/16, 11:01 PM   Name: [name] MRN: [id number] Date of Birth: 1945/06/21

## 2016-11-09 ENCOUNTER — Encounter: Payer: Self-pay | Admitting: Physical Therapy

## 2016-11-09 ENCOUNTER — Ambulatory Visit: Payer: Medicare Other | Admitting: Physical Therapy

## 2016-11-09 ENCOUNTER — Encounter (HOSPITAL_COMMUNITY): Payer: Self-pay

## 2016-11-09 DIAGNOSIS — I69354 Hemiplegia and hemiparesis following cerebral infarction affecting left non-dominant side: Secondary | ICD-10-CM | POA: Diagnosis not present

## 2016-11-09 DIAGNOSIS — R2689 Other abnormalities of gait and mobility: Secondary | ICD-10-CM

## 2016-11-09 DIAGNOSIS — R2681 Unsteadiness on feet: Secondary | ICD-10-CM | POA: Diagnosis not present

## 2016-11-09 DIAGNOSIS — M6281 Muscle weakness (generalized): Secondary | ICD-10-CM | POA: Diagnosis not present

## 2016-11-09 NOTE — Therapy (Signed)
[facility] [address] [place], [place], [address] Phone: [phone]   Fax:  [phone]  Physical Therapy Treatment  Patient Details  Name: [name] MRN: [id number] Date of Birth: 1946-03-02 Referring Provider: Dr. Alysia Penna  Encounter Date: 11/09/2016      PT End of Session - 11/09/16 1644    Visit Number 34   Number of Visits 10  60 day recertification   Date for PT Re-Evaluation 62/94/76  60 day recert; check LTG at 4 weeks   Authorization Type Medicare-G Codes and Progress Notes every 10th visit   PT Start Time 1400   PT Stop Time 1449   PT Time Calculation (min) 49 min   Activity Tolerance Patient tolerated treatment well   Behavior During Therapy [facility] for tasks assessed/performed      Past Medical History:  Diagnosis Date  . Anemia 07/29/2013  . Asthma    childhood asthma  . Benign prostatic hypertrophy    several prostate biopsies neg for cancer.   . Bilateral hip pain   . Cancer ([place])   . CHF (congestive heart failure) ([place])   . Complication of anesthesia    MORPHINE CAUSES HALLUCINATIONS  . CVA (cerebral vascular accident) ([place]) 06/2016  . Depression   . FH: colonic polyps 2010  . Gout   . History of kidney stones   . Hypertension   . Kidney cysts   . Morbid obesity ([place])   . Numbness    feet / legs  . PAF (paroxysmal atrial fibrillation) with RVR 07/29/2013  . Sleep apnea    USES C PAP    Past Surgical History:  Procedure Laterality Date  . ARTHROSCOPY KNEE W/ DRILLING    . BREATH TEK H PYLORI N/A 12/23/2012   Procedure: BREATH TEK H PYLORI;  Surgeon: Gayland Curry, MD;  Location: [facility];  Service: General;  Laterality: N/A;  . CHOLECYSTECTOMY  1989  . COLONOSCOPY N/A 07/31/2013   Procedure: COLONOSCOPY;  Surgeon: Gatha Mayer, MD;  Location: [facility];  Service: Endoscopy;  Laterality: N/A;  . ESOPHAGOGASTRODUODENOSCOPY N/A 07/22/2013   Procedure:  ESOPHAGOGASTRODUODENOSCOPY (EGD);  Surgeon: Irene Shipper, MD;  Location: [facility]COPY;  Service: Endoscopy;  Laterality: N/A;  . ESOPHAGOGASTRODUODENOSCOPY N/A 07/31/2013   Procedure: ESOPHAGOGASTRODUODENOSCOPY (EGD);  Surgeon: Gatha Mayer, MD;  Location: [facility] ENDOSCOPY;  Service: Endoscopy;  Laterality: N/A;  . INTRAOPERATIVE TRANSESOPHAGEAL ECHOCARDIOGRAM N/A 07/18/2013   Procedure: INTRAOPERATIVE TRANSESOPHAGEAL ECHOCARDIOGRAM;  Surgeon: Grace Isaac, MD;  Location: [facility];  Service: Open Heart Surgery;  Laterality: N/A;  . LAPAROSCOPIC GASTRIC BANDING WITH HIATAL HERNIA REPAIR N/A 04/08/2013   Procedure: LAPAROSCOPIC GASTRIC BANDING WITH possible  HIATAL HERNIA REPAIR;  Surgeon: Gayland Curry, MD;  [facility];  Service: General;  Laterality: N/A;  . LITHOTRIPSY    . renal calculi    . SUBXYPHOID PERICARDIAL WINDOW N/A 07/18/2013   Procedure: SUBXYPHOID PERICARDIAL WINDOW;  Surgeon: Grace Isaac, MD;  Location: [facility];  Service: Thoracic;  Laterality: N/A;  . TONSILLECTOMY    . total knee replacment     bilat  . trigger finger repair     also lue, cts surgically repaired  . WISDOM TOOTH EXTRACTION      There were no vitals filed for this visit.      Subjective Assessment - 11/09/16 1410    Subjective Pt did not feel well yesterday, aches and pains and had to take Tramadol.  Did  have xrays of lumbar spine on Tuesday.  Will be making appointment with neuro-opthamology.   Patient is accompained by: Family member   Pertinent History asthma, basal cell carcinoma, obesity, gout, depression, HTN, OSA, peripheral neuropathy, CHF, PAF, PE, bil TKA   Limitations Walking   Patient Stated Goals improve walking, balance, wants to drive again and be independent   Currently in Pain? No/denies                         [facility] Adult PT Treatment/Exercise - 11/09/16 1412      Exercises   Exercises Lumbar     Lumbar Exercises: Stretches   Active Hamstring Stretch 1  rep;60 seconds   Active Hamstring Stretch Limitations bilateral   Pelvic Tilt 5 reps   Pelvic Tilt Limitations anterior/posterior   Standing Extension 5 reps     Lumbar Exercises: Supine   Bridge Non-compliant;5 reps;3 seconds   Bridge Limitations cues for glute activation and to keep pelvis level when lifting   Other Supine Lumbar Exercises TA activation in hooklying with focus on isolated activation; only able to activate x 2-3 times      Hamstring Stretch    With other leg bent, foot flat, grasp right leg and slowly try to straighten knee. Hold _30___ seconds. Repeat __2__ times each leg. Do __2__ sessions per day.  http://gt2.exer.us/279   Copyright  VHI. All rights reserved.  Pelvic Tilt    Flatten back by tightening stomach muscles and buttocks then tilt hip forwards and arch back slightly. Repeat 10 times per set. Do 1 sets per session. Do 2 sessions per day.    Bridging    Slowly raise buttocks from floor, keeping stomach tight. Repeat 5 times per set. Do 2 sets per session. Do 2 sessions per day.  http://orth.exer.us/1096     Backward Bend (Standing)   Arch backward to make hollow of back deeper. Hold 5 seconds. Repeat 5 times per set. Do 2 sets per session. Do 2 sessions per day.  http://orth.exer.us/178   Copyright  VHI. All rights reserved.            PT Education - 11/09/16 1644    Education provided Yes   Education Details lumbar stabilization exercises for HEP   Person(s) Educated Patient;Spouse   Methods Explanation;Demonstration;Handout   Comprehension Verbalized understanding;Returned demonstration          PT Short Term Goals - 08/08/16 1452      PT SHORT TERM GOAL #1   Title verbalize understanding of CVA risk factors/warning signs (08/10/16)   Status Achieved     PT SHORT TERM GOAL #2   Title improve timed up and go to < 30 sec for improved mobility (08/10/16)   Status Achieved     PT SHORT TERM GOAL #3   Title  improve gait velocity to > 1.32 ft/sec for improved mobility and community access (08/10/16)   Status Achieved     PT SHORT TERM GOAL #4   Title amb > 300' on level indoor/paved outdoor surfaces with LRAD with supervision for improved community access (08/10/16)   Status Achieved     PT SHORT TERM GOAL #5   Title perform BERG with appropriate LTG to be written (08/10/16)   Status Achieved           PT Long Term Goals - 11/03/16 1139      PT LONG TERM GOAL #1   Title independent with HEP  Time 4   Period Weeks   Status Revised   Target Date 12/03/16     PT LONG TERM GOAL #2   Title TUG no longer indicated-pt is below cutoff for increased falls risk     PT LONG TERM GOAL #3   Title Pt will improve DGI score to > or = 19/24 to decrease falls risk during community gait   Baseline TBD   Time 4   Period Weeks   Status New   Target Date 12/03/16     PT LONG TERM GOAL #4   Title amb > 500' on various indoor/outdoor surfaces and will negotiate up/down 7 stairs with cane and rail with supervision for improved independence and mobility   Time 4   Period Weeks   Status Revised   Target Date 12/03/16     PT LONG TERM GOAL #5   Title Pt will decrease falls risk as indicated by an increase in BERG balance score to > or = 53/56    Baseline 50/56 on 10/27/16   Time 4   Period Weeks   Status Revised   Target Date 12/03/16     Additional Long Term Goals   Additional Long Term Goals Yes     PT LONG TERM GOAL #6   Title Gait velocity WFL-no goal indicated     PT LONG TERM GOAL #7   Title Pt will improve endurance during functional mobility as indicated by improvement in 6 min walk test by 200'   Baseline 1030   Time 4   Period Weeks   Status New   Target Date 12/03/16               Plan - 11/09/16 1645    Clinical Impression Statement Treatment session today focused on initiation of lumbar stabilization program; reviewed lumbar xrays prior to initiating exercises.  Also performed TA activation training but pt only able to activate 2-3 times before mm fatigued and pt began to compensate with superficial abdominal mm.  Will continue to train TA before adding to HEP.  Pt provided with HEP; will continue to progress and add to lumbar stabilization HEP.  Pt tolerated well.     Rehab Potential Good   Clinical Impairments Affecting Rehab Potential multiple comorbidities   PT Frequency 2x / week   PT Duration Other (comment)  60 day recertification; reassess at 4 weeks   PT Treatment/Interventions ADLs/Self Care Home Management;Cryotherapy;Electrical Stimulation;Neuromuscular re-education;Balance training;Therapeutic exercise;Therapeutic activities;Functional mobility training;Gait training;Stair training;Patient/family education;Orthotic Fit/Training;Manual techniques;Vestibular;Energy conservation;Moist Heat;DME Instruction   PT Next Visit Plan Continue to work on TA activation and add to lumbar stabilization HEP; Reciprocal stepping on stairs and work towards stepping up with cane only, SLS, endurance with walking on uneven/compliant surfaces, reaching to floor for objects and maintaining balance   Consulted and Agree with Plan of Care Patient;Family member/caregiver   Family Member Consulted wife      Patient will benefit from skilled therapeutic intervention in order to improve the following deficits and impairments:  Abnormal gait, Decreased balance, Decreased activity tolerance, Decreased strength, Postural dysfunction, Obesity, Decreased endurance, Decreased knowledge of use of DME, Decreased mobility, Difficulty walking, Dizziness, Decreased coordination, Impaired sensation, Impaired vision/preception, Cardiopulmonary status limiting activity  Visit Diagnosis: Other abnormalities of gait and mobility  Muscle weakness (generalized)  Hemiplegia and hemiparesis following cerebral infarction affecting left non-dominant side (HCC)  Unsteadiness on  feet     Problem List Patient Active Problem List   Diagnosis Date Noted  .  Chronic right-sided low back pain with sciatica 11/03/2016  . Benign essential HTN   . 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 Embolic cerebral infarction ([place]) 06/21/2016  . Stroke syndrome ([place])   . OSA (obstructive sleep apnea)   . Dysphagia, post-stroke   . Neurologic gait disorder   . Encounter for monitoring flecainide therapy 04/28/2016  . Recurrent pulmonary emboli ([place]) 06/13/2015  . GERD (gastroesophageal reflux disease) 06/13/2015  . Basal cell carcinoma of skin 06/28/2014  . 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 PAF (paroxysmal atrial fibrillation) ([place]) 07/29/2013  . Chronic anticoagulation 07/22/2013  . Gastric ulcer with hemorrhage 07/20/13 07/22/2013  . Diastolic congestive heart failure ([place]) 07/16/2013  . H/O laparoscopic adjustable gastric banding & hiatal hernia repair 04/08/13 04/23/2013  . Prediabetes 02/05/2013  . TMJ (temporomandibular joint syndrome) 09/11/2012  . Morbid obesity ([place]) 08/09/2012  . Peripheral neuropathy ([place]) 11/02/2009  . Hyperlipidemia with target LDL less than 130 09/10/2008  . GOUT 06/28/2006  . BPH (benign prostatic hyperplasia) 06/28/2006    Raylene Everts, PT, DPT 11/09/16    5:01 PM    [facility] [address] [place], [place], [address] Phone: [phone]   Fax:  [phone]  Name: [name] MRN: [id number] Date of Birth: 1945-09-03

## 2016-11-09 NOTE — Patient Instructions (Signed)
Hamstring Stretch    With other leg bent, foot flat, grasp right leg and slowly try to straighten knee. Hold _30___ seconds. Repeat __2__ times each leg. Do __2__ sessions per day.  http://gt2.exer.us/279   Copyright  VHI. All rights reserved.  Pelvic Tilt    Flatten back by tightening stomach muscles and buttocks then tilt hip forwards and arch back slightly. Repeat 10 times per set. Do 1 sets per session. Do 2 sessions per day.    Bridging    Slowly raise buttocks from floor, keeping stomach tight. Repeat 5 times per set. Do 2 sets per session. Do 2 sessions per day.  http://orth.exer.us/1096     Backward Bend (Standing)   Arch backward to make hollow of back deeper. Hold 5 seconds. Repeat 5 times per set. Do 2 sets per session. Do 2 sessions per day.  http://orth.exer.us/178   Copyright  VHI. All rights reserved.

## 2016-11-13 ENCOUNTER — Encounter: Payer: Self-pay | Admitting: Physical Therapy

## 2016-11-13 ENCOUNTER — Ambulatory Visit: Payer: Medicare Other | Admitting: Physical Therapy

## 2016-11-13 DIAGNOSIS — R2689 Other abnormalities of gait and mobility: Secondary | ICD-10-CM

## 2016-11-13 DIAGNOSIS — I69354 Hemiplegia and hemiparesis following cerebral infarction affecting left non-dominant side: Secondary | ICD-10-CM | POA: Diagnosis not present

## 2016-11-13 DIAGNOSIS — R2681 Unsteadiness on feet: Secondary | ICD-10-CM | POA: Diagnosis not present

## 2016-11-13 DIAGNOSIS — M6281 Muscle weakness (generalized): Secondary | ICD-10-CM | POA: Diagnosis not present
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
## 2016-11-15 ENCOUNTER — Ambulatory Visit: Payer: Self-pay | Admitting: Physical Therapy

## 2016-11-16 ENCOUNTER — Encounter: Payer: Self-pay | Admitting: Physical Therapy

## 2016-11-16 ENCOUNTER — Ambulatory Visit: Payer: Medicare Other | Admitting: Physical Therapy

## 2016-11-16 VITALS — BP 130/70 | HR 77

## 2016-11-16 DIAGNOSIS — M6281 Muscle weakness (generalized): Secondary | ICD-10-CM | POA: Diagnosis not present

## 2016-11-16 DIAGNOSIS — I69354 Hemiplegia and hemiparesis following cerebral infarction affecting left non-dominant side: Secondary | ICD-10-CM | POA: Diagnosis not present

## 2016-11-16 DIAGNOSIS — R2681 Unsteadiness on feet: Secondary | ICD-10-CM | POA: Diagnosis not present

## 2016-11-16 DIAGNOSIS — R2689 Other abnormalities of gait and mobility: Secondary | ICD-10-CM

## 2016-11-16 NOTE — Therapy (Signed)
[facility] [address] [place], [place], [address] Phone: [phone]   Fax:  [phone]  Physical Therapy Treatment  Patient Details  Name: [name] MRN: [id number] Date of Birth: 1945/09/29 Referring Provider: Dr. Alysia Penna  Encounter Date: 11/16/2016      PT End of Session - 11/16/16 1732    Visit Number 36   Number of Visits 50  60 day recertification   Date for PT Re-Evaluation 82/42/35  60 day recert; check LTG at 4 weeks   Authorization Type Medicare-G Codes and Progress Notes every 10th visit   PT Start Time 1315   PT Stop Time 1400   PT Time Calculation (min) 45 min   Activity Tolerance Patient limited by fatigue   Behavior During Therapy [facility] for tasks assessed/performed      Past Medical History:  Diagnosis Date  . Anemia 07/29/2013  . Asthma    childhood asthma  . Benign prostatic hypertrophy    several prostate biopsies neg for cancer.   . Bilateral hip pain   . Cancer ([place])   . CHF (congestive heart failure) ([place])   . Complication of anesthesia    MORPHINE CAUSES HALLUCINATIONS  . CVA (cerebral vascular accident) ([place]) 06/2016  . Depression   . FH: colonic polyps 2010  . Gout   . History of kidney stones   . Hypertension   . Kidney cysts   . Morbid obesity ([place])   . Numbness    feet / legs  . PAF (paroxysmal atrial fibrillation) with RVR 07/29/2013  . Sleep apnea    USES C PAP    Past Surgical History:  Procedure Laterality Date  . ARTHROSCOPY KNEE W/ DRILLING    . BREATH TEK H PYLORI N/A 12/23/2012   Procedure: BREATH TEK H PYLORI;  Surgeon: Gayland Curry, MD;  Location: [facility];  Service: General;  Laterality: N/A;  . CHOLECYSTECTOMY  1989  . COLONOSCOPY N/A 07/31/2013   Procedure: COLONOSCOPY;  Surgeon: Gatha Mayer, MD;  Location: [facility];  Service: Endoscopy;  Laterality: N/A;  . ESOPHAGOGASTRODUODENOSCOPY N/A 07/22/2013   Procedure:  ESOPHAGOGASTRODUODENOSCOPY (EGD);  Surgeon: Irene Shipper, MD;  Location: [facility] ENDOSCOPY;  Service: Endoscopy;  Laterality: N/A;  . ESOPHAGOGASTRODUODENOSCOPY N/A 07/31/2013   Procedure: ESOPHAGOGASTRODUODENOSCOPY (EGD);  Surgeon: Gatha Mayer, MD;  Location: [facility] ENDOSCOPY;  Service: Endoscopy;  Laterality: N/A;  . INTRAOPERATIVE TRANSESOPHAGEAL ECHOCARDIOGRAM N/A 07/18/2013   Procedure: INTRAOPERATIVE TRANSESOPHAGEAL ECHOCARDIOGRAM;  Surgeon: Grace Isaac, MD;  Location: [facility];  Service: Open Heart Surgery;  Laterality: N/A;  . LAPAROSCOPIC GASTRIC BANDING WITH HIATAL HERNIA REPAIR N/A 04/08/2013   Procedure: LAPAROSCOPIC GASTRIC BANDING WITH possible  HIATAL HERNIA REPAIR;  Surgeon: Gayland Curry, MD;  Location: [facility];  Service: General;  Laterality: N/A;  . LITHOTRIPSY    . renal calculi    . SUBXYPHOID PERICARDIAL WINDOW N/A 07/18/2013   Procedure: SUBXYPHOID PERICARDIAL WINDOW;  Surgeon: Grace Isaac, MD;  Location: [facility];  Service: Thoracic;  Laterality: N/A;  . TONSILLECTOMY    . total knee replacment     bilat  . trigger finger repair     also lue, cts surgically repaired  . WISDOM TOOTH EXTRACTION      Vitals:   11/16/16 1348  BP: 130/70  Pulse: 77  SpO2: 96%        Subjective Assessment - 11/16/16 1320    Subjective Wife is still sick; pt has been driving  if absolutely necessary.  No issues.  Is going to call to set up neuro opthamologist appointment this afternoon.   Pertinent History asthma, basal cell carcinoma, obesity, gout, depression, HTN, OSA, peripheral neuropathy, CHF, PAF, PE, bil TKA   Limitations Walking   Patient Stated Goals improve walking, balance, wants to drive again and be independent   Currently in Pain? No/denies                         [facility] Adult PT Treatment/Exercise - 11/16/16 1335      Ambulation/Gait   Stairs Yes   Stairs Assistance 4: Min assist   Stairs Assistance Details (indicate cue type and reason) to simulate  curb with pt leading with cane and Left foot to ascend and descend-required min A when descending due to posterior LOB.   Stair Management Technique No rails;Step to pattern;Forwards;With cane   Number of Stairs 8   Height of Stairs 6     Lumbar Exercises: Supine   Other Supine Lumbar Exercises Continued TA activation training with tactile and verbal cues to maintain contraction during alternating LE heel slides; pt only able to maintain contraction during 3 reps and then experienced mm fatigue.               Balance Exercises - 11/16/16 1405      Balance Exercises: Standing   Standing Eyes Opened Wide (BOA);Head turns  standing facing up/down on ramp   Standing Eyes Closed Wide (BOA)  10 seconds feet facing up/down on ramp           PT Education - 11/16/16 1730    Education provided Yes   Education Details monitoring weight as indicator for CHF fluid retention   Person(s) Educated Patient   Methods Explanation   Comprehension Verbalized understanding          PT Short Term Goals - 08/08/16 1452      PT SHORT TERM GOAL #1   Title verbalize understanding of CVA risk factors/warning signs (08/10/16)   Status Achieved     PT SHORT TERM GOAL #2   Title improve timed up and go to < 30 sec for improved mobility (08/10/16)   Status Achieved     PT SHORT TERM GOAL #3   Title improve gait velocity to > 1.32 ft/sec for improved mobility and community access (08/10/16)   Status Achieved     PT SHORT TERM GOAL #4   Title amb > 300' on level indoor/paved outdoor surfaces with LRAD with supervision for improved community access (08/10/16)   Status Achieved     PT SHORT TERM GOAL #5   Title perform BERG with appropriate LTG to be written (08/10/16)   Status Achieved           PT Long Term Goals - 11/03/16 1139      PT LONG TERM GOAL #1   Title independent with HEP    Time 4   Period Weeks   Status Revised   Target Date 12/03/16     PT LONG TERM GOAL #2   Title  TUG no longer indicated-pt is below cutoff for increased falls risk     PT LONG TERM GOAL #3   Title Pt will improve DGI score to > or = 19/24 to decrease falls risk during community gait   Baseline TBD   Time 4   Period Weeks   Status New   Target Date 12/03/16  PT LONG TERM GOAL #4   Title amb > 500' on various indoor/outdoor surfaces and will negotiate up/down 7 stairs with cane and rail with supervision for improved independence and mobility   Time 4   Period Weeks   Status Revised   Target Date 12/03/16     PT LONG TERM GOAL #5   Title Pt will decrease falls risk as indicated by an increase in BERG balance score to > or = 53/56    Baseline 50/56 on 10/27/16   Time 4   Period Weeks   Status Revised   Target Date 12/03/16     Additional Long Term Goals   Additional Long Term Goals Yes     PT LONG TERM GOAL #6   Title Gait velocity WFL-no goal indicated     PT LONG TERM GOAL #7   Title Pt will improve endurance during functional mobility as indicated by improvement in 6 min walk test by 200'   Baseline 1030   Time 4   Period Weeks   Status New   Target Date 12/03/16               Plan - 11/16/16 1732    Clinical Impression Statement Continued to focus on TA activation adding in LE movements in supine; will continue to progress. Continued single limb stance balance training in order to perform stair and curb negotiation with cane with verbal cues for sequencing and min-mod A to maintain balance due to multiple LOB posterior and laterally.  Continued static balance training standing on inclines with head turns and eyes closed to decrease visual dependence.  Pt reporting increased fatigue today and feeling more off balance.  Reports running lots of errands for wife the other day.  Pt also asking questions about how CHF can influence endurance.  Pt vitals stable today but encouraged pt to monitor weight to determine if he is retaining fluid.  Will continue to monitor  and will continue to progress towards LTG.   Rehab Potential Good   Clinical Impairments Affecting Rehab Potential multiple comorbidities   PT Frequency 2x / week   PT Duration Other (comment)  60 day recertification; reassess at 4 weeks   PT Treatment/Interventions ADLs/Self Care Home Management;Cryotherapy;Electrical Stimulation;Neuromuscular re-education;Balance training;Therapeutic exercise;Therapeutic activities;Functional mobility training;Gait training;Stair training;Patient/family education;Orthotic Fit/Training;Manual techniques;Vestibular;Energy conservation;Moist Heat;DME Instruction   PT Next Visit Plan Continue to work on TA activation and add to lumbar stabilization HEP; Reciprocal stepping on stairs and work towards stepping up with cane only, SLS, endurance with walking on uneven/compliant surfaces, reaching to floor for objects and maintaining balance   Consulted and Agree with Plan of Care Patient      Patient will benefit from skilled therapeutic intervention in order to improve the following deficits and impairments:  Abnormal gait, Decreased balance, Decreased activity tolerance, Decreased strength, Postural dysfunction, Obesity, Decreased endurance, Decreased knowledge of use of DME, Decreased mobility, Difficulty walking, Dizziness, Decreased coordination, Impaired sensation, Impaired vision/preception, Cardiopulmonary status limiting activity  Visit Diagnosis: Hemiplegia and hemiparesis following cerebral infarction affecting left non-dominant side (HCC)  Other abnormalities of gait and mobility  Muscle weakness (generalized)  Unsteadiness on feet     Problem List Patient Active Problem List   Diagnosis Date Noted  . Chronic right-sided low back pain with sciatica 11/03/2016  . Benign essential HTN   . 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 Embolic cerebral infarction ([place]) 06/21/2016  . Stroke syndrome ([place])   . OSA (obstructive sleep apnea)   .  Dysphagia,  post-stroke   . Neurologic gait disorder   . Encounter for monitoring flecainide therapy 04/28/2016  . Recurrent pulmonary emboli ([place]) 06/13/2015  . GERD (gastroesophageal reflux disease) 06/13/2015  . Basal cell carcinoma of skin 06/28/2014  . 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 PAF (paroxysmal atrial fibrillation) ([place]) 07/29/2013  . Chronic anticoagulation 07/22/2013  . Gastric ulcer with hemorrhage 07/20/13 07/22/2013  . Diastolic congestive heart failure ([place]) 07/16/2013  . H/O laparoscopic adjustable gastric banding & hiatal hernia repair 04/08/13 04/23/2013  . Prediabetes 02/05/2013  . TMJ (temporomandibular joint syndrome) 09/11/2012  . Morbid obesity ([place]) 08/09/2012  . Peripheral neuropathy ([place]) 11/02/2009  . Hyperlipidemia with target LDL less than 130 09/10/2008  . GOUT 06/28/2006  . BPH (benign prostatic hyperplasia) 06/28/2006   Raylene Everts, PT, DPT 11/16/16    5:38 PM    [facility] [address] [place], [place], [address] Phone: [phone]   Fax:  [phone]  Name: [name] MRN: [id number] Date of Birth: Dec 16, 1945

## 2016-11-20 ENCOUNTER — Encounter: Payer: Self-pay | Admitting: Physical Therapy

## 2016-11-20 ENCOUNTER — Ambulatory Visit: Payer: Medicare Other | Admitting: Physical Therapy

## 2016-11-20 DIAGNOSIS — R2681 Unsteadiness on feet: Secondary | ICD-10-CM | POA: Diagnosis not present

## 2016-11-20 DIAGNOSIS — M6281 Muscle weakness (generalized): Secondary | ICD-10-CM | POA: Diagnosis not present

## 2016-11-20 DIAGNOSIS — I69354 Hemiplegia and hemiparesis following cerebral infarction affecting left non-dominant side: Secondary | ICD-10-CM

## 2016-11-20 DIAGNOSIS — R2689 Other abnormalities of gait and mobility: Secondary | ICD-10-CM

## 2016-11-22 ENCOUNTER — Encounter: Payer: Self-pay | Admitting: Internal Medicine

## 2016-11-22 ENCOUNTER — Ambulatory Visit: Payer: Medicare Other | Admitting: Physical Therapy

## 2016-11-22 ENCOUNTER — Encounter: Payer: Self-pay | Admitting: Physical Therapy

## 2016-11-22 ENCOUNTER — Ambulatory Visit (INDEPENDENT_AMBULATORY_CARE_PROVIDER_SITE_OTHER): Payer: Medicare Other | Admitting: Internal Medicine

## 2016-11-22 VITALS — BP 110/60 | HR 77 | Ht 72.0 in | Wt 356.0 lb

## 2016-11-22 DIAGNOSIS — I480 Paroxysmal atrial fibrillation: Secondary | ICD-10-CM | POA: Diagnosis not present

## 2016-11-22 DIAGNOSIS — G463 Brain stem stroke syndrome: Secondary | ICD-10-CM | POA: Diagnosis not present

## 2016-11-22 DIAGNOSIS — R2681 Unsteadiness on feet: Secondary | ICD-10-CM | POA: Diagnosis not present

## 2016-11-22 DIAGNOSIS — J383 Other diseases of vocal cords: Secondary | ICD-10-CM | POA: Diagnosis not present

## 2016-11-22 DIAGNOSIS — I69354 Hemiplegia and hemiparesis following cerebral infarction affecting left non-dominant side: Secondary | ICD-10-CM

## 2016-11-22 DIAGNOSIS — R2689 Other abnormalities of gait and mobility: Secondary | ICD-10-CM | POA: Diagnosis not present

## 2016-11-22 DIAGNOSIS — R0602 Shortness of breath: Secondary | ICD-10-CM

## 2016-11-22 DIAGNOSIS — M6281 Muscle weakness (generalized): Secondary | ICD-10-CM

## 2016-11-22 NOTE — Therapy (Signed)
[place] [address] [place], [place], [address] Phone: [phone]   Fax:  [phone]  Physical Therapy Treatment  Patient Details  Name: [name] MRN: [id number] Date of Birth: [birth year] Referring Provider: Dr. Alysia Penna  Encounter Date: 11/20/2016   11/20/16 1455  PT Visits / Re-Eval  Visit Number 37  Number of Visits 29 (60 day recertification)  Date for PT Re-Evaluation 69/62/95 (60 day recert; check LTG at 4 weeks)  Authorization  Authorization Type Medicare-G Codes and Progress Notes every 10th visit  PT Time Calculation  PT Start Time 1448  PT Stop Time 1530  PT Time Calculation (min) 42 min  PT - End of Session  Equipment Utilized During Treatment Gait belt  Activity Tolerance Patient limited by fatigue;Patient tolerated treatment well  Behavior During Therapy [facility] for tasks assessed/performed     Past Medical History:  Diagnosis Date  . Anemia 07/29/2013  . Asthma    childhood asthma  . Benign prostatic hypertrophy    several prostate biopsies neg for cancer.   . Bilateral hip pain   . Cancer ([place])   . CHF (congestive heart failure) ([place])   . Complication of anesthesia    MORPHINE CAUSES HALLUCINATIONS  . CVA (cerebral vascular accident) ([place]) 06/2016  . Depression   . FH: colonic polyps 2010  . Gout   . History of kidney stones   . Hypertension   . Kidney cysts   . Morbid obesity ([place])   . Numbness    feet / legs  . PAF (paroxysmal atrial fibrillation) with RVR 07/29/2013  . Sleep apnea    USES C PAP    Past Surgical History:  Procedure Laterality Date  . ARTHROSCOPY KNEE W/ DRILLING    . BREATH TEK H PYLORI N/A 12/23/2012   Procedure: BREATH TEK H PYLORI;  Surgeon: Gayland Curry, MD;  Location: [facility];  Service: General;  Laterality: N/A;  . CHOLECYSTECTOMY  1989  . COLONOSCOPY N/A 07/31/2013   Procedure: COLONOSCOPY;  Surgeon: Gatha Mayer, MD;   Location: [facility];  Service: Endoscopy;  Laterality: N/A;  . ESOPHAGOGASTRODUODENOSCOPY N/A 07/22/2013   Procedure: ESOPHAGOGASTRODUODENOSCOPY (EGD);  Surgeon: Irene Shipper, MD;  Location: [facility] ENDOSCOPY;  Service: Endoscopy;  Laterality: N/A;  . ESOPHAGOGASTRODUODENOSCOPY N/A 07/31/2013   Procedure: ESOPHAGOGASTRODUODENOSCOPY (EGD);  Surgeon: Gatha Mayer, MD;  Location: [facility] ENDOSCOPY;  Service: Endoscopy;  Laterality: N/A;  . INTRAOPERATIVE TRANSESOPHAGEAL ECHOCARDIOGRAM N/A 07/18/2013   Procedure: INTRAOPERATIVE TRANSESOPHAGEAL ECHOCARDIOGRAM;  Surgeon: Grace Isaac, MD;  Location: [facility];  Service: Open Heart Surgery;  Laterality: N/A;  . LAPAROSCOPIC GASTRIC BANDING WITH HIATAL HERNIA REPAIR N/A 04/08/2013   Procedure: LAPAROSCOPIC GASTRIC BANDING WITH possible  HIATAL HERNIA REPAIR;  Surgeon: Gayland Curry, MD;  Location: [facility];  Service: General;  Laterality: N/A;  . LITHOTRIPSY    . renal calculi    . SUBXYPHOID PERICARDIAL WINDOW N/A 07/18/2013   Procedure: SUBXYPHOID PERICARDIAL WINDOW;  Surgeon: Grace Isaac, MD;  Location: [place];  Service: Thoracic;  Laterality: N/A;  . TONSILLECTOMY    . total knee replacment     bilat  . trigger finger repair     also lue, cts surgically repaired  . WISDOM TOOTH EXTRACTION      There were no vitals filed for this visit.     11/20/16 1453  Symptoms/Limitations  Subjective No new complaints. No falls or pain to report. Does feel more unsteady today.  Has set up the neuro opthamologist appt for early September (can't remember the exact date). Reports the core strengthening ex's are going okay.   Pertinent History asthma, basal cell carcinoma, obesity, gout, depression, HTN, OSA, peripheral neuropathy, CHF, PAF, PE, bil TKA  Limitations Walking  Patient Stated Goals improve walking, balance, wants to drive again and be independent  Pain Assessment  Currently in Pain? No/denies  Pain Score 0      11/20/16 1458  Ambulation/Gait   Stairs Yes  Stairs Assistance 4: Min assist  Stairs Assistance Details (indicate cue type and reason) blocked practice with cane only with cues on weight shifting and posture. min guard to min assist for balance.   Stair Management Technique No rails;Step to pattern;Forwards;With cane  Number of Stairs 4 (x 3 reps)  Height of Stairs 6  Curb Other (comment);4: Min assist (min guard assist)  Curb Details (indicate cue type and reason) using aerobic steps with no risers progressing to single riser under them with small base quad cane. blocked practice with cues on posture, weight shifting and cane placement   Lumbar Exercises: Supine  Other Supine Lumbar Exercises used manual blood pressure cuff to assist with TA activation and maintanence with: LE marching, then LE slides x 9-10 reps each side total. Pt needed to "reset" TA after about 3-4 reps.                      11/20/16 1523  Balance Exercises: Standing  Balance Beam standing with feet across blue foam beam: alternating fwd stepping to floor and back onto beam x 10 each side, then alternating backward stepping to floor and back onto beam x 10 each side. light fingertip touch on bars with cues on step length, step height, and weight shifting.           PT Short Term Goals - 08/08/16 1452      PT SHORT TERM GOAL #1   Title verbalize understanding of CVA risk factors/warning signs (08/10/16)   Status Achieved     PT SHORT TERM GOAL #2   Title improve timed up and go to < 30 sec for improved mobility (08/10/16)   Status Achieved     PT SHORT TERM GOAL #3   Title improve gait velocity to > 1.32 ft/sec for improved mobility and community access (08/10/16)   Status Achieved     PT SHORT TERM GOAL #4   Title amb > 300' on level indoor/paved outdoor surfaces with LRAD with supervision for improved community access (08/10/16)   Status Achieved     PT SHORT TERM GOAL #5   Title perform BERG with appropriate LTG to be written  (08/10/16)   Status Achieved           PT Long Term Goals - 11/03/16 1139      PT LONG TERM GOAL #1   Title independent with HEP    Time 4   Period Weeks   Status Revised   Target Date 12/03/16     PT LONG TERM GOAL #2   Title TUG no longer indicated-pt is below cutoff for increased falls risk     PT LONG TERM GOAL #3   Title Pt will improve DGI score to > or = 19/24 to decrease falls risk during community gait   Baseline TBD   Time 4   Period Weeks   Status New   Target Date 12/03/16     PT LONG TERM  GOAL #4   Title amb > 500' on various indoor/outdoor surfaces and will negotiate up/down 7 stairs with cane and rail with supervision for improved independence and mobility   Time 4   Period Weeks   Status Revised   Target Date 12/03/16     PT LONG TERM GOAL #5   Title Pt will decrease falls risk as indicated by an increase in BERG balance score to > or = 53/56    Baseline 50/56 on 10/27/16   Time 4   Period Weeks   Status Revised   Target Date 12/03/16     Additional Long Term Goals   Additional Long Term Goals Yes     PT LONG TERM GOAL #6   Title Gait velocity WFL-no goal indicated     PT LONG TERM GOAL #7   Title Pt will improve endurance during functional mobility as indicated by improvement in 6 min walk test by 200'   Baseline 1030   Time 4   Period Weeks   Status New   Target Date 12/03/16        11/20/16 1456  Plan  Clinical Impression Statement Today's skilled session initally continued to address TA activation for core/lumbar strengthening. Remainder of session continued to address barriers with [facility] and balance reactions. Pt is making steady progress toward goals and should benefit from continued PT to progress toward unmet goals.   Pt will benefit from skilled therapeutic intervention in order to improve on the following deficits Abnormal gait;Decreased balance;Decreased activity tolerance;Decreased strength;Postural dysfunction;Obesity;Decreased  endurance;Decreased knowledge of use of DME;Decreased mobility;Difficulty walking;Dizziness;Decreased coordination;Impaired sensation;Impaired vision/preception;Cardiopulmonary status limiting activity  Rehab Potential Good  Clinical Impairments Affecting Rehab Potential multiple comorbidities  PT Frequency 2x / week  PT Duration Other (comment) (60 day recertification; reassess at 4 weeks)  PT Treatment/Interventions ADLs/Self Care Home Management;Cryotherapy;Electrical Stimulation;Neuromuscular re-education;Balance training;Therapeutic exercise;Therapeutic activities;Functional mobility training;Gait training;Stair training;Patient/family education;Orthotic Fit/Training;Manual techniques;Vestibular;Energy conservation;Moist Heat;DME Instruction  PT Next Visit Plan Continue to work on TA activation and add to lumbar stabilization HEP; Reciprocal stepping on stairs and work towards stepping up with cane only, SLS, endurance with walking on uneven/compliant surfaces, reaching to floor for objects and maintaining balance  Consulted and Agree with Plan of Care Patient          Patient will benefit from skilled therapeutic intervention in order to improve the following deficits and impairments:  Abnormal gait, Decreased balance, Decreased activity tolerance, Decreased strength, Postural dysfunction, Obesity, Decreased endurance, Decreased knowledge of use of DME, Decreased mobility, Difficulty walking, Dizziness, Decreased coordination, Impaired sensation, Impaired vision/preception, Cardiopulmonary status limiting activity  Visit Diagnosis: Hemiplegia and hemiparesis following cerebral infarction affecting left non-dominant side (HCC)  Other abnormalities of gait and mobility  Unsteadiness on feet     Problem List Patient Active Problem List   Diagnosis Date Noted  . Chronic right-sided low back pain with sciatica 11/03/2016  . Benign essential HTN   . 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 Embolic cerebral infarction ([place]) 06/21/2016  . Stroke syndrome ([place])   . OSA (obstructive sleep apnea)   . Dysphagia, post-stroke   . Neurologic gait disorder   . Encounter for monitoring flecainide therapy 04/28/2016  . Recurrent pulmonary emboli ([place]) 06/13/2015  . GERD (gastroesophageal reflux disease) 06/13/2015  . Basal cell carcinoma of skin 06/28/2014  . 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 PAF (paroxysmal atrial fibrillation) ([place]) 07/29/2013  . Chronic anticoagulation 07/22/2013  . Gastric ulcer with hemorrhage 07/20/13 07/22/2013  .  Diastolic congestive heart failure ([place]) 07/16/2013  . H/O laparoscopic adjustable gastric banding & hiatal hernia repair 04/08/13 04/23/2013  . Prediabetes 02/05/2013  . TMJ (temporomandibular joint syndrome) 09/11/2012  . Morbid obesity ([place]) 08/09/2012  . Peripheral neuropathy ([place]) 11/02/2009  . Hyperlipidemia with target LDL less than 130 09/10/2008  . GOUT 06/28/2006  . BPH (benign prostatic hyperplasia) 06/28/2006    Willow Ora, PTA, [facility] Neuro [facility] [address] [place], [place] [address] [phone] 11/22/16, 8:17 AM   Name: [name] MRN: [id number] Date of Birth: 07/30/1945

## 2016-11-22 NOTE — Progress Notes (Signed)
OFFICE NOTE  Chief Complaint:  Short of breath with minimal exertion  Primary Care Physician: Janith Lima, MD  HPI:  [name] is a 71 y/o male with a history of bariatric surgery, one year ago, lost about 85 pounds before and after surgery. Also has a history of hypertension and sleep apnea. He has been admitted twice in the last month with an unusual presentation with multiple medical problems, including pulmonary embolus, moderate to large pericardial effusion, new onset a-fib and GI bleed. His PE was diagnosed by his PCP through outpatient w/u and he was placed on Xarelto. His first hospital admission was from 4/15-4/24. At that time his CC was chest pain. W/u suggested a diagnosis of pericarditis. An Echocardiogram was done, which showed moderate to large pericardial effusion. Meanwhile the night of 4/16 he went in to afib with RVR, probably secondary to PE and was transferred to step down and started on cardizem drip. He converted to sinus the same night , cardizem discontinued and he was resumed on metoprolol. CT surgery was consulted for pericardial window. He underwent pericardial window on 4/17 and hemorrhagic fluid was drained and was sent for analysis. Gram stain and cultures were negative. He was also started on colchicine for his pericarditis. The evening of 4/20 pt went back in to afib with RVR and had bloody bowel movement. His xarelto was discontinued and he was put on cardizem gtt. Over the next 24 hours his hemoglobin dropped to 7.8 and he received 2 units of prbc transfusion. His repeat H&H improved. Dr Henrene Pastor was consulted from GI on 4/20 and he underwent endoscopy on 4/21 . He was found to have superficial ulcers in the stomach, underwent biopsy of the area. He was recommended to continue on PPI twice daily for 2 weeks and resume xarelto. He was discharged home then returned 3 days later on 07/28/13 with a complaint of increased fatigue and SOB. Repeat 2D echo  demonstrated only minimal residual effusion with normal RV and LV function. 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 He was admitted and underwent a repeat EGD which showed healing of ulcers, no bleed. He was continued on PO iron and a PPI. His Hgb at time of discharge was stable at 9.0. He was discharged home. Cardiology had recommended an OP stress test. This was done 08/03/13 and showed normal myocardial perfusion and normal LV EF of 58%.  He returns today and has reported a marked improvement in his shortness of breath. He is able to do most activities without any difficulty. He continues on Eliquis for his pulmonary embolus and for stroke prevention. He is on flecainide for antiarrhythmic therapy. He denies any chest pain. His only complaint is fatigue. He reports some fatigue and notices it worse as he has been working on refurbishing a house that his mother used to own to sell. He reports his sleep is good at night however he does have difficulty falling asleep which may be playing a role in his fatigue.  [name] returns today for follow-up. He reports no significant difference in his symptoms. He gets some mild shortness of breath with exercise which she does very infrequently. He also reports leg swelling which is persistent. Weight has been fairly stable although could stand to go down. We talked about ways he could work on weight loss. He does have a history of lap band in the past however he is not been able to lose anymore  weight and in fact has gained it back. He denies any recurrent palpitation or A. Fib that he is aware of. He continues on flecainide which he is tolerating without any difficulty.  04/28/2016  [name] was seen back today in follow-up. He continues to complain of shortness of breath, persistent with exertion. Weight is now up from 337-350 pounds. He is celebrating the birth of this newest grandchild and wants to be more active and is frustrated that he is not  able to do much activity without shortness of breath. He is on flecainide without any recurrent atrial fibrillation. His last stress test was more than 2 years ago and he is due for repeat stress test.  05/23/2016  [name] returns today for follow-up. He is unfortunately gained another few pounds although he felt that this is related to stuff in his pockets. His stress test was negative for ischemia. It is noted that his blood pressure is high again today 168/89. He was previously elevated over 150. He does have a history of hypertension and was on Lotrel in the past but was taken off of that due to hypotension after significant weight loss that occurred after lap band surgery. Unfortunately he has gained back most of that weight and likely is hypertensive secondary to that.  11/22/2016  [name] was seen today in follow-up. He continues to have some progressive shortness of breath with exertion. 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 I wonder if this is playing a role in upper airway obstruction which could be causing his shortness of breath. Blood pressure appears to be better controlled today although he was taken off of blood pressure medications when he had a stroke. He was also previously on a number of inhalers including Symbicort and Breo Ellipta - these have also been discontinued.  PMHx:  Past Medical History:  Diagnosis Date  . Anemia 07/29/2013  . Asthma    childhood asthma  . Benign prostatic hypertrophy    several prostate biopsies neg for cancer.   . Bilateral hip pain   . Cancer ([place])   . CHF (congestive heart failure) ([place])   . Complication of anesthesia    MORPHINE CAUSES HALLUCINATIONS  . CVA (cerebral vascular accident) ([place]) 06/2016  . Depression   . FH: colonic polyps 2010  . Gout   . History of kidney stones   . Hypertension   . Kidney cysts   . Morbid obesity ([place])     . Numbness    feet / legs  . PAF (paroxysmal atrial fibrillation) with RVR 07/29/2013  . Sleep apnea    USES C PAP    Past Surgical History:  Procedure Laterality Date  . ARTHROSCOPY KNEE W/ DRILLING    . BREATH TEK H PYLORI N/A 12/23/2012   Procedure: BREATH TEK H PYLORI;  Surgeon: Gayland Curry, MD;  Location: [facility];  Service: General;  Laterality: N/A;  . CHOLECYSTECTOMY  1989  . COLONOSCOPY N/A 07/31/2013   Procedure: COLONOSCOPY;  Surgeon: Gatha Mayer, MD;  Location: [facility];  Service: Endoscopy;  Laterality: N/A;  . ESOPHAGOGASTRODUODENOSCOPY N/A 07/22/2013   Procedure: ESOPHAGOGASTRODUODENOSCOPY (EGD);  Surgeon: Irene Shipper, MD;  Location: [facility] END[facility]COPY;  Service: Endoscopy;  Laterality: N/A;  . ESOPHAGOGASTRODUODENOSCOPY N/A 07/31/2013   Procedure: ESOPHAGOGASTRODUODENOSCOPY (EGD);  Surgeon: Gatha Mayer, MD;  Location: [facility] ENDOSCOPY;  Service: Endoscopy;  Laterality: N/A;  .  INTRAOPERATIVE TRANSESOPHAGEAL ECHOCARDIOGRAM N/A 07/18/2013   Procedure: INTRAOPERATIVE TRANSESOPHAGEAL ECHOCARDIOGRAM;  Surgeon: Grace Isaac, MD;  Location: [place];  Service: Open Heart Surgery;  Laterality: N/A;  . LAPAROSCOPIC GASTRIC BANDING WITH HIATAL HERNIA REPAIR N/A 04/08/2013   Procedure: LAPAROSCOPIC GASTRIC BANDING WITH possible  HIATAL HERNIA REPAIR;  Surgeon: Gayland Curry, MD;  [facility];  Service: General;  Laterality: N/A;  . LITHOTRIPSY    . renal calculi    . SUBXYPHOID PERICARDIAL WINDOW N/A 07/18/2013   Procedure: SUBXYPHOID PERICARDIAL WINDOW;  Surgeon: Grace Isaac, MD;  Location: [place];  Service: Thoracic;  Laterality: N/A;  . TONSILLECTOMY    . total knee replacment     bilat  . trigger finger repair     also lue, cts surgically repaired  . WISDOM TOOTH EXTRACTION      FAMHx:  Family History  Problem Relation Age of Onset  . Depression Mother   . Hypertension Mother   . Dementia Mother   . Heart disease Father        MI  . Depression Brother    . Stroke Paternal Aunt        CVA  . Diabetes Paternal Uncle   . Heart disease Paternal Uncle        MI  . Heart disease Paternal Grandmother        MI  . Diabetes Cousin        PATERNAL    SOCHx:   reports that he quit smoking about 42 years ago. His smoking use included Cigarettes. He started smoking about 55 years ago. He has a 2.60 pack-year smoking history. He has never used smokeless tobacco. He reports that he does not drink alcohol or use drugs.  ALLERGIES:  Allergies  Allergen Reactions  . Morphine Other (See Comments)    Nausea and Severe hallucinations  . Codeine Nausea Only  . Penicillins Other (See Comments)    LOC with shot  . Propoxyphene N-Acetaminophen Nausea Only    ROS: Pertinent items noted in HPI and remainder of comprehensive ROS otherwise negative.  HOME MEDS: Current Outpatient Prescriptions  Medication Sig Dispense Refill  . apixaban (ELIQUIS) 5 MG TABS tablet Take 1 tablet (5 mg total) by mouth 2 (two) times daily. 180 tablet 3  . aspirin 81 MG tablet Take 81 mg by mouth 2 (two) times daily.     [name] atorvastatin (LIPITOR) 80 MG tablet Take 1 tablet (80 mg total) by mouth daily. 90 tablet 1  . CALCIUM PO Take 500 mg by mouth 3 (three) times daily.     . Cyanocobalamin (VITAMIN B-12) 1000 MCG SUBL Place 1,000 mcg under the tongue every evening.     [name] Sodium (DSS) 100 MG CAPS Take 100 mg by mouth every other day.    . finasteride (PROSCAR) 5 MG tablet Take 1 tablet (5 mg total) by mouth daily. 30 tablet 1  . flecainide (TAMBOCOR) 50 MG tablet Take 1 tablet (50 mg total) by mouth every 12 (twelve) hours. 180 tablet 2  . furosemide (LASIX) 40 MG tablet TAKE 1 TABLET BY MOUTH EVERY DAY 90 tablet 1  . KLOR-CON M20 20 MEQ tablet TAKE 2 TABLETS BY MOUTH EVERY DAY 60 tablet 3  . loratadine (CLARITIN) 10 MG tablet Take 10 mg by mouth daily.    [name] LORazepam (ATIVAN) 0.5 MG tablet Take 1 tablet (0.5 mg total) by mouth at bedtime. 30 tablet 2  .  montelukast (SINGULAIR) 10  MG tablet TAKE 1 TABLET BY MOUTH EVERYDAY AT BEDTIME 30 tablet 5  . pantoprazole (PROTONIX) 40 MG tablet TAKE 1 TABLET BY MOUTH EVERY DAY 90 tablet 1  . rOPINIRole (REQUIP) 0.5 MG tablet TAKE 1 TABLET AT BEDTIME 90 tablet 1  . sertraline (ZOLOFT) 100 MG tablet Take 1.5 tablets (150 mg total) by mouth daily. 45 tablet 2  . traMADol (ULTRAM) 50 MG tablet Take 1 tablet (50 mg total) by mouth every 8 (eight) hours as needed for severe pain. 20 tablet 0   No current facility-administered medications for this visit.     LABS/IMAGING: No results found for this or any previous visit (from the past 48 hour(s)). No results found.  VITALS: BP 110/60   Pulse 77   Ht 6' (1.829 m)   Wt (!) 356 lb (161.5 kg)   BMI 48.28 kg/m   EXAM: General appearance: alert, no distress and morbidly obese Neck: no carotid bruit, no JVD and thyroid not enlarged, symmetric, no tenderness/mass/nodules Lungs: diminished breath sounds bilaterally Heart: regular rate and rhythm Abdomen: morbidly obese Extremities: extremities normal, atraumatic, no cyanosis or edema Pulses: 2+ and symmetric Skin: pale, warm, diaphoretic Neurologic: Grossly normal Psych: Pleasant  EKG: Normal sinus rhythm at 77, nonspecific ST changes-personally reviewed  ASSESSMENT: 1. Persistent dyspnea on exertion 2. Recent stroke with partial vocal cord paralysis 3. Paroxysmal atrial fibrillation on flecainide 4. Hyperlipidemia 5. Hypertension 6. Recent history of pericarditis/pericardial effusion status post pericardial window 7. Pulmonary embolus 8. Morbid obesity 9. Obstructive sleep apnea on CPAP  PLAN: 1.   [name] is had persistent dyspnea on exertion which I think is out of proportion to his weight although that's contributing to it. He had a recent stroke with partial vocal cord paralysis and there may be upper airway obstruction. He has difficulty phonating and it short of breath after speaking  long sentences. I like for him to see pulmonary as he also has a history of pulmonary embolus in the past. Pulmonary function testing may help determine his shortness of breath, particularly if there is evidence for upper airway obstruction. He may ultimately need ENT referral although treatment options may be limited.  Follow-up with me in 6 months.  Pixie Casino, MD, [facility] Attending Cardiologist [place] 11/22/2016, 2:07 PM

## 2016-11-22 NOTE — Patient Instructions (Addendum)
You have been referred to Dr. Lake Bells @ [facility] Pulmonology   Your physician wants you to follow-up in: 6 months with Dr. Debara Pickett. You will receive a reminder letter in the mail two months in advance. If you don't receive a letter, please call our office to schedule the follow-up appointment.

## 2016-11-23 NOTE — Therapy (Signed)
[facility] [address] [place], [place], [address] Phone: [phone]   Fax:  [phone]  Physical Therapy Treatment  Patient Details  Name: [name] MRN: [id number] Date of Birth: 11-03-1945 Referring Provider: Dr. Alysia Penna  Encounter Date: 11/22/2016      PT End of Session - 11/22/16 1453    Visit Number 38   Number of Visits 24  60 day recertification   Date for PT Re-Evaluation 44/81/85  60 day recert; check LTG at 4 weeks   Authorization Type Medicare-G Codes and Progress Notes every 10th visit   PT Start Time 1450   PT Stop Time 1530   PT Time Calculation (min) 40 min   Equipment Utilized During Treatment Gait belt   Activity Tolerance Patient limited by fatigue;Patient tolerated treatment well   Behavior During Therapy [facility] for tasks assessed/performed      Past Medical History:  Diagnosis Date  . Anemia 07/29/2013  . Asthma    childhood asthma  . Benign prostatic hypertrophy    several prostate biopsies neg for cancer.   . Bilateral hip pain   . Cancer ([place])   . CHF (congestive heart failure) ([place])   . Complication of anesthesia    MORPHINE CAUSES HALLUCINATIONS  . CVA (cerebral vascular accident) ([place]) 06/2016  . Depression   . FH: colonic polyps 2010  . Gout   . History of kidney stones   . Hypertension   . Kidney cysts   . Morbid obesity ([place])   . Numbness    feet / legs  . PAF (paroxysmal atrial fibrillation) with RVR 07/29/2013  . Sleep apnea    USES C PAP    Past Surgical History:  Procedure Laterality Date  . ARTHROSCOPY KNEE W/ DRILLING    . BREATH TEK H PYLORI N/A 12/23/2012   Procedure: BREATH TEK H PYLORI;  Surgeon: Gayland Curry, MD;  Location: [facility];  Service: General;  Laterality: N/A;  . CHOLECYSTECTOMY  1989  . COLONOSCOPY N/A 07/31/2013   Procedure: COLONOSCOPY;  Surgeon: Gatha Mayer, MD;  Location: [facility];  Service: Endoscopy;   Laterality: N/A;  . ESOPHAGOGASTRODUODENOSCOPY N/A 07/22/2013   Procedure: ESOPHAGOGASTRODUODENOSCOPY (EGD);  Surgeon: Irene Shipper, MD;  Location: [facility] ENDOSCOPY;  Service: Endoscopy;  Laterality: N/A;  . ESOPHAGOGASTRODUODENOSCOPY N/A 07/31/2013   Procedure: ESOPHAGOGASTRODUODENOSCOPY (EGD);  Surgeon: Gatha Mayer, MD;  Location: [facility] ENDOSCOPY;  Service: Endoscopy;  Laterality: N/A;  . INTRAOPERATIVE TRANSESOPHAGEAL ECHOCARDIOGRAM N/A 07/18/2013   Procedure: INTRAOPERATIVE TRANSESOPHAGEAL ECHOCARDIOGRAM;  Surgeon: Grace Isaac, MD;  Location: [place];  Service: Open Heart Surgery;  Laterality: N/A;  . LAPAROSCOPIC GASTRIC BANDING WITH HIATAL HERNIA REPAIR N/A 04/08/2013   Procedure: LAPAROSCOPIC GASTRIC BANDING WITH possible  HIATAL HERNIA REPAIR;  Surgeon: Gayland Curry, MD;  [facility];  Service: General;  Laterality: N/A;  . LITHOTRIPSY    . renal calculi    . SUBXYPHOID PERICARDIAL WINDOW N/A 07/18/2013   Procedure: SUBXYPHOID PERICARDIAL WINDOW;  Surgeon: Grace Isaac, MD;  Location: [facility];  Service: Thoracic;  Laterality: N/A;  . TONSILLECTOMY    . total knee replacment     bilat  . trigger finger repair     also lue, cts surgically repaired  . WISDOM TOOTH EXTRACTION      There were no vitals filed for this visit.      Subjective Assessment - 11/22/16 1451    Subjective Saw Dr Debara Pickett who is  referring him to a pulmonologist to look at lungs and voice box to investigate the shortness of breath. Pt also reports less dizzy today than he has been. No falls or pain to report.    Pertinent History asthma, basal cell carcinoma, obesity, gout, depression, HTN, OSA, peripheral neuropathy, CHF, PAF, PE, bil TKA   Limitations Walking   Patient Stated Goals improve walking, balance, wants to drive again and be independent   Currently in Pain? No/denies   Pain Score 0-No pain            [facility] Adult PT Treatment/Exercise - 11/22/16 1454      High Level Balance   High Level  Balance Activities Tandem walking;Marching forwards;Marching backwards   High Level Balance Comments on blue mat in parallel bars with min assist for balance and occasional touch to bars for balance x 3 laps each way.              Balance Exercises - 11/22/16 1454      Balance Exercises: Standing   Rockerboard Anterior/posterior;Head turns;EO;EC;30 seconds;10 reps;Intermittent UE support   Step Over Hurdles / Cones blocked practice with 4 hurdles on blue mat in parallel bars: stepping over in step to pattern with UE support progressing towards no support, then progressing towards reciprocal stepping with UE support. attempted reciprocal stepping without UE support without success due to balance loss with this. cues on posture, weight shifting for balance.                               Balance Exercises: Standing   Rebounder Limitations on balance board in ant/post direction: EO rocking board with emphasis on tall posture, then holding board steady for alternating UE raises, then bil UE raises. all with cues on posture and weight shifiting to assist balance. holding board steady: EC no head movements, progressing to EC head movements left<>right, up<>down and diagonals both ways. min guard assist to min assist with intermittent UE touch to bars for balance.                                       PT Short Term Goals - 08/08/16 1452      PT SHORT TERM GOAL #1   Title verbalize understanding of CVA risk factors/warning signs (08/10/16)   Status Achieved     PT SHORT TERM GOAL #2   Title improve timed up and go to < 30 sec for improved mobility (08/10/16)   Status Achieved     PT SHORT TERM GOAL #3   Title improve gait velocity to > 1.32 ft/sec for improved mobility and community access (08/10/16)   Status Achieved     PT SHORT TERM GOAL #4   Title amb > 300' on level indoor/paved outdoor surfaces with LRAD with supervision for improved community access (08/10/16)   Status Achieved      PT SHORT TERM GOAL #5   Title perform BERG with appropriate LTG to be written (08/10/16)   Status Achieved           PT Long Term Goals - 11/03/16 1139      PT LONG TERM GOAL #1   Title independent with HEP    Time 4   Period Weeks   Status Revised   Target Date 12/03/16     PT LONG TERM GOAL #  2   Title TUG no longer indicated-pt is below cutoff for increased falls risk     PT LONG TERM GOAL #3   Title Pt will improve DGI score to > or = 19/24 to decrease falls risk during community gait   Baseline TBD   Time 4   Period Weeks   Status New   Target Date 12/03/16     PT LONG TERM GOAL #4   Title amb > 500' on various indoor/outdoor surfaces and will negotiate up/down 7 stairs with cane and rail with supervision for improved independence and mobility   Time 4   Period Weeks   Status Revised   Target Date 12/03/16     PT LONG TERM GOAL #5   Title Pt will decrease falls risk as indicated by an increase in BERG balance score to > or = 53/56    Baseline 50/56 on 10/27/16   Time 4   Period Weeks   Status Revised   Target Date 12/03/16     Additional Long Term Goals   Additional Long Term Goals Yes     PT LONG TERM GOAL #6   Title Gait velocity WFL-no goal indicated     PT LONG TERM GOAL #7   Title Pt will improve endurance during functional mobility as indicated by improvement in 6 min walk test by 200'   Baseline 1030   Time 4   Period Weeks   Status New   Target Date 12/03/16           Plan - 11/22/16 1453    Clinical Impression Statement Today's skilled session addressed balance on complaint surfaces with emphasis on ankle/hip strategies and on single leg stance balance to carryover to curbs/stair negotiation. Pt did not wear ankle ASO today and had one episode of ankle rolling/instability during session. Pt is making progress toward goals and should benefit from continued PT to progress toward unmet goals.                                         Rehab  Potential Good   Clinical Impairments Affecting Rehab Potential multiple comorbidities   PT Frequency 2x / week   PT Duration Other (comment)  60 day recertification; reassess at 4 weeks   PT Treatment/Interventions ADLs/Self Care Home Management;Cryotherapy;Electrical Stimulation;Neuromuscular re-education;Balance training;Therapeutic exercise;Therapeutic activities;Functional mobility training;Gait training;Stair training;Patient/family education;Orthotic Fit/Training;Manual techniques;Vestibular;Energy conservation;Moist Heat;DME Instruction   PT Next Visit Plan Continue to work on TA activation and add to lumbar stabilization HEP; Reciprocal stepping on stairs and work towards stepping up with cane only, SLS, endurance with walking on uneven/compliant surfaces, reaching to floor for objects and maintaining balance   Consulted and Agree with Plan of Care Patient      Patient will benefit from skilled therapeutic intervention in order to improve the following deficits and impairments:  Abnormal gait, Decreased balance, Decreased activity tolerance, Decreased strength, Postural dysfunction, Obesity, Decreased endurance, Decreased knowledge of use of DME, Decreased mobility, Difficulty walking, Dizziness, Decreased coordination, Impaired sensation, Impaired vision/preception, Cardiopulmonary status limiting activity  Visit Diagnosis: Hemiplegia and hemiparesis following cerebral infarction affecting left non-dominant side (HCC)  Other abnormalities of gait and mobility  Unsteadiness on feet  Muscle weakness (generalized)     Problem List Patient Active Problem List   Diagnosis Date Noted  . Vocal cord dysfunction 11/22/2016  . Chronic right-sided low back pain with  sciatica 11/03/2016  . Benign essential HTN   . 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 Embolic cerebral infarction ([place]) 06/21/2016  . Stroke syndrome ([place])   . OSA (obstructive sleep apnea)   . Dysphagia, post-stroke   .  Neurologic gait disorder   . Encounter for monitoring flecainide therapy 04/28/2016  . Shortness of breath 04/28/2016  . Recurrent pulmonary emboli ([place]) 06/13/2015  . GERD (gastroesophageal reflux disease) 06/13/2015  . Basal cell carcinoma of skin 06/28/2014  . 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 PAF (paroxysmal atrial fibrillation) ([place]) 07/29/2013  . Chronic anticoagulation 07/22/2013  . Gastric ulcer with hemorrhage 07/20/13 07/22/2013  . Diastolic congestive heart failure ([place]) 07/16/2013  . H/O laparoscopic adjustable gastric banding & hiatal hernia repair 04/08/13 04/23/2013  . Prediabetes 02/05/2013  . TMJ (temporomandibular joint syndrome) 09/11/2012  . Morbid obesity ([place]) 08/09/2012  . Peripheral neuropathy ([place]) 11/02/2009  . Hyperlipidemia with target LDL less than 130 09/10/2008  . GOUT 06/28/2006  . BPH (benign prostatic hyperplasia) 06/28/2006    Willow Ora, PTA, [facility] [address], [place], [place] [address] [phone] 11/23/16, 9:18 PM   Name: GIROLAMO LORTIE MRN: [id number] Date of Birth: [birth year]

## 2016-11-27 ENCOUNTER — Ambulatory Visit: Payer: Self-pay | Admitting: Physical Therapy

## 2016-11-29 ENCOUNTER — Ambulatory Visit: Payer: Self-pay | Admitting: Physical Therapy

## 2016-11-29 ENCOUNTER — Encounter (HOSPITAL_COMMUNITY): Payer: Self-pay | Admitting: Psychiatry

## 2016-11-29 ENCOUNTER — Ambulatory Visit (INDEPENDENT_AMBULATORY_CARE_PROVIDER_SITE_OTHER): Payer: Medicare Other | Admitting: Psychiatry

## 2016-11-29 DIAGNOSIS — Z8673 Personal history of transient ischemic attack (TIA), and cerebral infarction without residual deficits: Secondary | ICD-10-CM | POA: Diagnosis not present

## 2016-11-29 DIAGNOSIS — Z810 Family history of intellectual disabilities: Secondary | ICD-10-CM | POA: Diagnosis not present

## 2016-11-29 DIAGNOSIS — G463 Brain stem stroke syndrome: Secondary | ICD-10-CM

## 2016-11-29 DIAGNOSIS — J3800 Paralysis of vocal cords and larynx, unspecified: Secondary | ICD-10-CM

## 2016-11-29 DIAGNOSIS — Z87891 Personal history of nicotine dependence: Secondary | ICD-10-CM | POA: Diagnosis not present

## 2016-11-29 DIAGNOSIS — F330 Major depressive disorder, recurrent, mild: Secondary | ICD-10-CM

## 2016-11-29 DIAGNOSIS — R0602 Shortness of breath: Secondary | ICD-10-CM | POA: Diagnosis not present

## 2016-11-29 DIAGNOSIS — Z818 Family history of other mental and behavioral disorders: Secondary | ICD-10-CM | POA: Diagnosis not present

## 2016-11-29 MED ORDER — SERTRALINE HCL 100 MG PO TABS: 150.0000 mg | ORAL_TABLET | Freq: Every day | ORAL | 2 refills | Status: DC

## 2016-11-29 MED ORDER — LORAZEPAM 0.5 MG PO TABS: 0.5000 mg | ORAL_TABLET | Freq: Every day | ORAL | 2 refills | Status: DC

## 2016-11-29 NOTE — Progress Notes (Signed)
[facility] MD/PA/NP OP Progress Note  11/29/2016 1:22 PM [name]  MRN:  [id number]  Chief Complaint:  I am doing better.  HPI: Patient came for his follow-up appointment.  He is taking lorazepam and Zoloft.  He slowly and gradually improving from the stroke which was happened in March.  He is doing physical therapy which is about to end soon.  He sleeping good with the help of Ativan on CPAP machine.  He denies any crying spells or any feeling of hopelessness or worthlessness.  He still frustrated with his voice because he has paralysis of vocal cord and some time difficulty speaking.  He has left-sided weakness after the stroke but his spirits are very high and he is happy that he is able to drive if needed.  He denies any agitation, anger, mania, psychosis.  We stopped the Risperdal 3 months ago and is doing very well without it.  He has no negative thoughts.  He has no paranoia.  He is trying to lose weight and he lost 5 pounds since the last visit.  His watching his calories.  He has no tremors or shakes.  However he gets tired and exhausted easily.  Patient denies drinking alcohol or using any illegal substances.  Visit Diagnosis:    ICD-10-CM   1. Major depressive disorder, recurrent episode, mild (HCC) F33.0 sertraline (ZOLOFT) 100 MG tablet    LORazepam (ATIVAN) 0.5 MG tablet    Past Psychiatric History: Reviewed. Patient has one psychiatric admission in 2006 due to increased depression and having suicidal thoughts. He was taking Risperdal but it was discontinued after the stroke in March 2018.    Past Medical History:  Past Medical History:  Diagnosis Date  . Anemia 07/29/2013  . Asthma    childhood asthma  . Benign prostatic hypertrophy    several prostate biopsies neg for cancer.   . Bilateral hip pain   . Cancer ([place])   . CHF (congestive heart failure) ([place])   . Complication of anesthesia    MORPHINE CAUSES HALLUCINATIONS  . CVA (cerebral vascular accident) ([place]) 06/2016   . Depression   . FH: colonic polyps 2010  . Gout   . History of kidney stones   . Hypertension   . Kidney cysts   . Morbid obesity ([place])   . Numbness    feet / legs  . PAF (paroxysmal atrial fibrillation) with RVR 07/29/2013  . Sleep apnea    USES C PAP    Past Surgical History:  Procedure Laterality Date  . ARTHROSCOPY KNEE W/ DRILLING    . BREATH TEK H PYLORI N/A 12/23/2012   Procedure: BREATH TEK H PYLORI;  Surgeon: Gayland Curry, MD;  Location: [facility];  Service: General;  Laterality: N/A;  . CHOLECYSTECTOMY  1989  . COLONOSCOPY N/A 07/31/2013   Procedure: COLONOSCOPY;  Surgeon: Gatha Mayer, MD;  Location: [facility];  Service: Endoscopy;  Laterality: N/A;  . ESOPHAGOGASTRODUODENOSCOPY N/A 07/22/2013   Procedure: ESOPHAGOGASTRODUODENOSCOPY (EGD);  Surgeon: Irene Shipper, MD;  [facility];  Service: Endoscopy;  Laterality: N/A;  . ESOPHAGOGASTRODUODENOSCOPY N/A 07/31/2013   Procedure: ESOPHAGOGASTRODUODENOSCOPY (EGD);  Surgeon: Gatha Mayer, MD;  Location: [facility] ENDOSCOPY;  Service: Endoscopy;  Laterality: N/A;  . INTRAOPERATIVE TRANSESOPHAGEAL ECHOCARDIOGRAM N/A 07/18/2013   Procedure: INTRAOPERATIVE TRANSESOPHAGEAL ECHOCARDIOGRAM;  Surgeon: Grace Isaac, MD;  Location: [place];  Service: Open Heart Surgery;  Laterality: N/A;  . LAPAROSCOPIC GASTRIC BANDING WITH HIATAL HERNIA REPAIR N/A 04/08/2013  Procedure: LAPAROSCOPIC GASTRIC BANDING WITH possible  HIATAL HERNIA REPAIR;  Surgeon: Gayland Curry, MD;  [facility];  Service: General;  Laterality: N/A;  . LITHOTRIPSY    . renal calculi    . SUBXYPHOID PERICARDIAL WINDOW N/A 07/18/2013   Procedure: SUBXYPHOID PERICARDIAL WINDOW;  Surgeon: Grace Isaac, MD;  Location: [place];  Service: Thoracic;  Laterality: N/A;  . TONSILLECTOMY    . total knee replacment     bilat  . trigger finger repair     also lue, cts surgically repaired  . WISDOM TOOTH EXTRACTION      Family Psychiatric History:  Reviewed.  Family History:  Family History  Problem Relation Age of Onset  . Depression Mother   . Hypertension Mother   . Dementia Mother   . Heart disease Father        MI  . Depression Brother   . Stroke Paternal Aunt        CVA  . Diabetes Paternal Uncle   . Heart disease Paternal Uncle        MI  . Heart disease Paternal Grandmother        MI  . Diabetes Cousin        PATERNAL    Social History:  Social History   Social History  . Marital status: Married    Spouse name: N/A  . Number of children: 1  . Years of education: N/A   Occupational History  . retired-Manager [employer]    Social History Main Topics  . Smoking status: Former Smoker    Packs/day: 0.20    Years: 13.00    Types: Cigarettes    Start date: 04/03/1961    Quit date: 08/30/1974  . Smokeless tobacco: Never Used  . Alcohol use No  . Drug use: No  . Sexual activity: Not Currently   Other Topics Concern  . None   Social History Narrative   Lives at home w/ his wife   Right-handed   Caffeine: decaf coffee, occasional tea    Allergies:  Allergies  Allergen Reactions  . Morphine Other (See Comments)    Nausea and Severe hallucinations  . Codeine Nausea Only  . Penicillins Other (See Comments)    LOC with shot  . Propoxyphene N-Acetaminophen Nausea Only    Metabolic Disorder Labs: Lab Results  Component Value Date   HGBA1C 5.8 (H) 06/19/2016   MPG 120 06/19/2016   MPG 128 (H) 12/05/2012   No results found for: PROLACTIN Lab Results  Component Value Date   CHOL 124 06/19/2016   TRIG 74 06/19/2016   HDL 32 (L) 06/19/2016   CHOLHDL 3.9 06/19/2016   VLDL 15 06/19/2016   LDLCALC 77 06/19/2016   LDLCALC 81 04/26/2016   Lab Results  Component Value Date   TSH 2.10 10/24/2016   TSH 2.68 10/20/2015    Therapeutic Level Labs: No results found for: LITHIUM No results found for: VALPROATE No components found for:  CBMZ  Current Medications: Current Outpatient Prescriptions   Medication Sig Dispense Refill  . apixaban (ELIQUIS) 5 MG TABS tablet Take 1 tablet (5 mg total) by mouth 2 (two) times daily. 180 tablet 3  . aspirin 81 MG tablet Take 81 mg by mouth 2 (two) times daily.     [name] atorvastatin (LIPITOR) 80 MG tablet Take 1 tablet (80 mg total) by mouth daily. 90 tablet 1  . CALCIUM PO Take 500 mg by mouth 3 (three) times daily.     [name]  Cyanocobalamin (VITAMIN B-12) 1000 MCG SUBL Place 1,000 mcg under the tongue every evening.     [name] Sodium (DSS) 100 MG CAPS Take 100 mg by mouth every other day.    . finasteride (PROSCAR) 5 MG tablet Take 1 tablet (5 mg total) by mouth daily. 30 tablet 1  . flecainide (TAMBOCOR) 50 MG tablet Take 1 tablet (50 mg total) by mouth every 12 (twelve) hours. 180 tablet 2  . furosemide (LASIX) 40 MG tablet TAKE 1 TABLET BY MOUTH EVERY DAY 90 tablet 1  . KLOR-CON M20 20 MEQ tablet TAKE 2 TABLETS BY MOUTH EVERY DAY 60 tablet 3  . loratadine (CLARITIN) 10 MG tablet Take 10 mg by mouth daily.    [name] LORazepam (ATIVAN) 0.5 MG tablet Take 1 tablet (0.5 mg total) by mouth at bedtime. 30 tablet 2  . montelukast (SINGULAIR) 10 MG tablet TAKE 1 TABLET BY MOUTH EVERYDAY AT BEDTIME 30 tablet 5  . pantoprazole (PROTONIX) 40 MG tablet TAKE 1 TABLET BY MOUTH EVERY DAY 90 tablet 1  . rOPINIRole (REQUIP) 0.5 MG tablet TAKE 1 TABLET AT BEDTIME 90 tablet 1  . sertraline (ZOLOFT) 100 MG tablet Take 1.5 tablets (150 mg total) by mouth daily. 45 tablet 2  . traMADol (ULTRAM) 50 MG tablet Take 1 tablet (50 mg total) by mouth every 8 (eight) hours as needed for severe pain. 20 tablet 0   No current facility-administered medications for this visit.      Musculoskeletal: Strength & Muscle Tone: decreased Gait & Station: unsteady, use stick to help balance Patient leans: Left  Psychiatric Specialty Exam: Review of Systems  Constitutional:       Tired  Respiratory: Positive for shortness of breath.   Neurological: Positive for tingling, sensory  change and focal weakness.    Blood pressure (!) 142/76, pulse 78, height 6' (1.829 m), weight (!) 355 lb (161 kg).Body mass index is 48.15 kg/m.  General Appearance: Casual and tired  Eye Contact:  Good  Speech:  Clear and Coherent  Volume:  Normal  Mood:  Euthymic  Affect:  Appropriate  Thought Process:  Goal Directed  Orientation:  Full (Time, Place, and Person)  Thought Content: Logical   Suicidal Thoughts:  No  Homicidal Thoughts:  No  Memory:  Immediate;   Good Recent;   Good Remote;   Good  Judgement:  Good  Insight:  Good  Psychomotor Activity:  Normal  Concentration:  Concentration: Good and Attention Span: Fair  Recall:  Good  Fund of Knowledge: Good  Language: Good  Akathisia:  No  Handed:  Right  AIMS (if indicated): not done  Assets:  Communication Skills Desire for Improvement Housing Resilience Social Support  ADL's:  Intact  Cognition: WNL  Sleep:  Good   Screenings: PHQ2-9     Office Visit from 07/28/2016 in Dr[facility] Alysia [facility]na[facility]/2015 [facility] Office Visit from 03/30/2014 in [place] from 03/07/2013 in [place] at  [employer] Total Score  0  0  0  0  PHQ-9 Total Score  2  -  -  -       Assessment and Plan: Major depressive disorder, recurrent.  Patient doing better on lorazepam and Zoloft.  Despite weakness on his left side is more hopeful and encouraged.  He will continue physical therapy.  Recommended to continue lorazepam 0.5 mg at bedtime and Zoloft 150 mg daily.  Patient denies any tremors shakes or any EPS.  Recommended to call us back if he is any question or any concern.  Follow-up in   [place] T., MD 11/29/2016, 1:22 PM

## 2016-11-30 ENCOUNTER — Ambulatory Visit: Payer: Medicare Other | Admitting: Physical Therapy

## 2016-11-30 DIAGNOSIS — I69354 Hemiplegia and hemiparesis following cerebral infarction affecting left non-dominant side: Secondary | ICD-10-CM | POA: Diagnosis not present

## 2016-11-30 DIAGNOSIS — M6281 Muscle weakness (generalized): Secondary | ICD-10-CM | POA: Diagnosis not present

## 2016-11-30 DIAGNOSIS — R2689 Other abnormalities of gait and mobility: Secondary | ICD-10-CM

## 2016-11-30 DIAGNOSIS — R2681 Unsteadiness on feet: Secondary | ICD-10-CM

## 2016-11-30 NOTE — Therapy (Signed)
[facility] [address] [place], [place], [address] Phone: [phone]   Fax:  [phone]  Physical Therapy Treatment  Patient Details  Name: [name] MRN: [id number] Date of Birth: Feb 01, 1946 Referring Provider: Dr. Alysia Penna  Encounter Date: 11/30/2016      PT End of Session - 11/30/16 1656    Visit Number 39   Number of Visits 18  60 day recertification   Date for PT Re-Evaluation 93/23/55  60 day recert; check LTG at 4 weeks   Authorization Type Medicare-G Codes and Progress Notes every 10th visit   PT Start Time 1404   PT Stop Time 1446   PT Time Calculation (min) 42 min   Activity Tolerance Patient tolerated treatment well   Behavior During Therapy [facility] for tasks assessed/performed      Past Medical History:  Diagnosis Date  . Anemia 07/29/2013  . Asthma    childhood asthma  . Benign prostatic hypertrophy    several prostate biopsies neg for cancer.   . Bilateral hip pain   . Cancer ([place])   . CHF (congestive heart failure) ([place])   . Complication of anesthesia    MORPHINE CAUSES HALLUCINATIONS  . CVA (cerebral vascular accident) ([place]) 06/2016  . Depression   . FH: colonic polyps 2010  . Gout   . History of kidney stones   . Hypertension   . Kidney cysts   . Morbid obesity ([place])   . Numbness    feet / legs  . PAF (paroxysmal atrial fibrillation) with RVR 07/29/2013  . Sleep apnea    USES C PAP    Past Surgical History:  Procedure Laterality Date  . ARTHROSCOPY KNEE W/ DRILLING    . BREATH TEK H PYLORI N/A 12/23/2012   Procedure: BREATH TEK H PYLORI;  Surgeon: Gayland Curry, MD;  Location: [facility];  Service: General;  Laterality: N/A;  . CHOLECYSTECTOMY  1989  . COLONOSCOPY N/A 07/31/2013   Procedure: COLONOSCOPY;  Surgeon: Gatha Mayer, MD;  Location: [facility];  Service: Endoscopy;  Laterality: N/A;  . ESOPHAGOGASTRODUODENOSCOPY N/A 07/22/2013   Procedure:  ESOPHAGOGASTRODUODENOSCOPY (EGD);  Surgeon: Irene Shipper, MD;  Location: [facility] ENDOSCOPY;  Service: Endoscopy;  Laterality: N/A;  . ESOPHAGOGASTRODUODENOSCOPY N/A 07/31/2013   Procedure: ESOPHAGOGASTRODUODENOSCOPY (EGD);  Surgeon: Gatha Mayer, MD;  Location: [facility] ENDOSCOPY;  Service: Endoscopy;  Laterality: N/A;  . INTRAOPERATIVE TRANSESOPHAGEAL ECHOCARDIOGRAM N/A 07/18/2013   Procedure: INTRAOPERATIVE TRANSESOPHAGEAL ECHOCARDIOGRAM;  Surgeon: Grace Isaac, MD;  Location: [facility];  Service: Open Heart Surgery;  Laterality: N/A;  . LAPAROSCOPIC GASTRIC BANDING WITH HIATAL HERNIA REPAIR N/A 04/08/2013   Procedure: LAPAROSCOPIC GASTRIC BANDING WITH possible  HIATAL HERNIA REPAIR;  Surgeon: Gayland Curry, MD;  Location: [facility];  Service: General;  Laterality: N/A;  . LITHOTRIPSY    . renal calculi    . SUBXYPHOID PERICARDIAL WINDOW N/A 07/18/2013   Procedure: SUBXYPHOID PERICARDIAL WINDOW;  Surgeon: Grace Isaac, MD;  Location: [facility];  Service: Thoracic;  Laterality: N/A;  . TONSILLECTOMY    . total knee replacment     bilat  . trigger finger repair     also lue, cts surgically repaired  . WISDOM TOOTH EXTRACTION      There were no vitals filed for this visit.      Subjective Assessment - 11/30/16 1654    Subjective Pt states he has MULTIPLE doctor appointments coming up and it is getting hard to keep  up with them all.  Beginning to stress him out. Back pain is about the same.   Patient is accompained by: Family member   Pertinent History asthma, basal cell carcinoma, obesity, gout, depression, HTN, OSA, peripheral neuropathy, CHF, PAF, PE, bil TKA   Limitations Walking   Patient Stated Goals improve walking, balance, wants to drive again and be independent   Currently in Pain? No/denies            [facility] PT Assessment - 11/30/16 1414      Ambulation/Gait   Stairs Yes   Stairs Assistance 5: Supervision   Stair Management Technique One rail Right;One rail Left;Alternating  pattern;Forwards;With cane   Number of Stairs 8   Height of Stairs 6     Berg Balance Test   Sit to Stand Able to stand without using hands and stabilize independently   Standing Unsupported Able to stand safely 2 minutes   Sitting with Back Unsupported but Feet Supported on Floor or Stool Able to sit safely and securely 2 minutes   Stand to Sit Sits safely with minimal use of hands   Transfers Able to transfer safely, minor use of hands   Standing Unsupported with Eyes Closed Able to stand 10 seconds with supervision   Standing Ubsupported with Feet Together Able to place feet together independently and stand 1 minute safely   From Standing, Reach Forward with Outstretched Arm Can reach confidently >25 cm (10")   From Standing Position, Pick up Object from Floor Able to pick up shoe, needs supervision   From Standing Position, Turn to Look Behind Over each Shoulder Looks behind from both sides and weight shifts well   Turn 360 Degrees Able to turn 360 degrees safely in 4 seconds or less   Standing Unsupported, Alternately Place Feet on Step/Stool Able to stand independently and safely and complete 8 steps in 20 seconds   Standing Unsupported, One Foot in Front Able to plae foot ahead of the other independently and hold 30 seconds   Standing on One Leg Tries to lift leg/unable to hold 3 seconds but remains standing independently   Total Score 50   Berg comment: 50/56     Dynamic Gait Index   Level Surface Mild Impairment   Change in Gait Speed Normal   Gait with Horizontal Head Turns Mild Impairment   Gait with Vertical Head Turns Mild Impairment   Gait and Pivot Turn Normal   Step Over Obstacle Normal   Step Around Obstacles Normal   Steps Mild Impairment   Total Score 20   DGI comment: 20/24                             PT Education - 11/30/16 1655    Education provided Yes   Education Details progress towards LTG and plan   Person(s) Educated  Patient;Spouse   Methods Explanation   Comprehension Verbalized understanding          PT Short Term Goals - 08/08/16 1452      PT SHORT TERM GOAL #1   Title verbalize understanding of CVA risk factors/warning signs (08/10/16)   Status Achieved     PT SHORT TERM GOAL #2   Title improve timed up and go to < 30 sec for improved mobility (08/10/16)   Status Achieved     PT SHORT TERM GOAL #3   Title improve gait velocity to > 1.32 ft/sec for improved  mobility and community access (08/10/16)   Status Achieved     PT SHORT TERM GOAL #4   Title amb > 300' on level indoor/paved outdoor surfaces with LRAD with supervision for improved community access (08/10/16)   Status Achieved     PT SHORT TERM GOAL #5   Title perform BERG with appropriate LTG to be written (08/10/16)   Status Achieved           PT Long Term Goals - 11/30/16 1411      PT LONG TERM GOAL #1   Title independent with HEP    Baseline currently performing 2x/week   Time 4   Period Weeks   Status Partially Met     PT LONG TERM GOAL #3   Title Pt will improve DGI score to > or = 19/24 to decrease falls risk during community gait   Baseline 20/24   Time 4   Period Weeks   Status Achieved     PT LONG TERM GOAL #4   Title amb > 500' on various indoor/outdoor surfaces and will negotiate up/down 7 stairs with cane and rail with supervision for improved independence and mobility   Baseline pt ambulating into/out of grocery store >500' over indoor and outdoor surfaces, up/down 8 stairs with rail and cane with supervision   Status Achieved     PT LONG TERM GOAL #5   Title Pt will decrease falls risk as indicated by an increase in BERG balance score to > or = 53/56    Baseline 50/56    Status Partially Met     PT LONG TERM GOAL #7   Title Pt will improve endurance during functional mobility as indicated by improvement in 6 min walk test by 200'   Baseline 1030   Time 4   Period Weeks   Status On-going                Plan - 11/30/16 1657    Clinical Impression Statement Initiated re-assessment of LTG except for HEP and 6 min walk test.  Pt has met LTG #3 and 4.  Has partially met BERG balance goal with score remaining stable at 50/56.  Pt continues to have increased sway and decreased balance reactions with narrow BOS and continued inability to stand on one foot.  Pt has improved gait with and without AD, demonstrates improved balance and safety with stair negotiation.  Will finish assessing LTG tomorrow and will likely D/C from therapy at this time due to patient having multiple upcoming MD appointments and patient feeling overwhelmed and exhausted.     Rehab Potential Good   Clinical Impairments Affecting Rehab Potential multiple comorbidities   PT Frequency 2x / week   PT Duration Other (comment)  60 day recertification; reassess at 4 weeks   PT Treatment/Interventions ADLs/Self Care Home Management;Cryotherapy;Electrical Stimulation;Neuromuscular re-education;Balance training;Therapeutic exercise;Therapeutic activities;Functional mobility training;Gait training;Stair training;Patient/family education;Orthotic Fit/Training;Manual techniques;Vestibular;Energy conservation;Moist Heat;DME Instruction   PT Next Visit Plan Finish assessment of LTG;6 min walk and HEP.  FOTO?  D/C with D/C G code   Consulted and Agree with Plan of Care Patient;Family member/caregiver   Family Member Consulted wife      Patient will benefit from skilled therapeutic intervention in order to improve the following deficits and impairments:  Abnormal gait, Decreased balance, Decreased activity tolerance, Decreased strength, Postural dysfunction, Obesity, Decreased endurance, Decreased knowledge of use of DME, Decreased mobility, Difficulty walking, Dizziness, Decreased coordination, Impaired sensation, Impaired vision/preception, Cardiopulmonary status limiting activity  Visit Diagnosis: Hemiplegia and hemiparesis  following cerebral infarction affecting left non-dominant side (HCC)  Other abnormalities of gait and mobility  Muscle weakness (generalized)  Unsteadiness on feet     Problem List Patient Active Problem List   Diagnosis Date Noted  . Vocal cord dysfunction 11/22/2016  . Chronic right-sided low back pain with sciatica 11/03/2016  . Benign essential HTN   . 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 Embolic cerebral infarction ([place]) 06/21/2016  . Stroke syndrome ([place])   . OSA (obstructive sleep apnea)   . Dysphagia, post-stroke   . Neurologic gait disorder   . Encounter for monitoring flecainide therapy 04/28/2016  . Shortness of breath 04/28/2016  . Recurrent pulmonary emboli ([place]) 06/13/2015  . GERD (gastroesophageal reflux disease) 06/13/2015  . Basal cell carcinoma of skin 06/28/2014  . 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 PAF (paroxysmal atrial fibrillation) ([place]) 07/29/2013  . Chronic anticoagulation 07/22/2013  . Gastric ulcer with hemorrhage 07/20/13 07/22/2013  . Diastolic congestive heart failure ([place]) 07/16/2013  . H/O laparoscopic adjustable gastric banding & hiatal hernia repair 04/08/13 04/23/2013  . Prediabetes 02/05/2013  . TMJ (temporomandibular joint syndrome) 09/11/2012  . Morbid obesity ([place]) 08/09/2012  . Peripheral neuropathy ([place]) 11/02/2009  . Hyperlipidemia with target LDL less than 130 09/10/2008  . GOUT 06/28/2006  . BPH (benign prostatic hyperplasia) 06/28/2006    Raylene Everts, PT, DPT 11/30/16    5:05 PM    [facility] [address] [place], [place], [address] Phone: [phone]   Fax:  [phone]  Name: [name] MRN: [id number] Date of Birth: Sep 24, 1945

## 2016-12-01 ENCOUNTER — Ambulatory Visit: Payer: Medicare Other | Admitting: Physical Therapy

## 2016-12-01 DIAGNOSIS — R2689 Other abnormalities of gait and mobility: Secondary | ICD-10-CM

## 2016-12-01 DIAGNOSIS — I69354 Hemiplegia and hemiparesis following cerebral infarction affecting left non-dominant side: Secondary | ICD-10-CM | POA: Diagnosis not present

## 2016-12-01 DIAGNOSIS — M6281 Muscle weakness (generalized): Secondary | ICD-10-CM | POA: Diagnosis not present

## 2016-12-01 DIAGNOSIS — R2681 Unsteadiness on feet: Secondary | ICD-10-CM

## 2016-12-01 NOTE — Therapy (Signed)
[facility] [address] [place], [place], [address] Phone: [phone]   Fax:  [phone]  Physical Therapy Treatment and D/C Summary  Patient Details  Name: [name] MRN: [id number] Date of Birth: [birth year] Referring Provider: Dr. Alysia Penna  Encounter Date: 12/01/2016      PT End of Session - 12/01/16 1231    Visit Number 40   Number of Visits 64  D/C today   Date for PT Re-Evaluation 12/03/16  D/C today   Authorization Type Medicare-G Codes and Progress Notes every 10th visit   PT Start Time 1150   PT Stop Time 1231   PT Time Calculation (min) 41 min   Activity Tolerance Patient tolerated treatment well   Behavior During Therapy [facility] for tasks assessed/performed      Past Medical History:  Diagnosis Date  . Anemia 07/29/2013  . Asthma    childhood asthma  . Benign prostatic hypertrophy    several prostate biopsies neg for cancer.   . Bilateral hip pain   . Cancer ([place])   . CHF (congestive heart failure) ([place])   . Complication of anesthesia    MORPHINE CAUSES HALLUCINATIONS  . CVA (cerebral vascular accident) ([place]) 06/2016  . Depression   . FH: colonic polyps 2010  . Gout   . History of kidney stones   . Hypertension   . Kidney cysts   . Morbid obesity ([place])   . Numbness    feet / legs  . PAF (paroxysmal atrial fibrillation) with RVR 07/29/2013  . Sleep apnea    USES C PAP    Past Surgical History:  Procedure Laterality Date  . ARTHROSCOPY KNEE W/ DRILLING    . BREATH TEK H PYLORI N/A 12/23/2012   Procedure: BREATH TEK H PYLORI;  Surgeon: Gayland Curry, MD;  Location: [facility];  Service: General;  Laterality: N/A;  . CHOLECYSTECTOMY  1989  . COLONOSCOPY N/A 07/31/2013   Procedure: COLONOSCOPY;  Surgeon: Gatha Mayer, MD;  Location: [facility];  Service: Endoscopy;  Laterality: N/A;  . ESOPHAGOGASTRODUODENOSCOPY N/A 07/22/2013   Procedure: ESOPHAGOGASTRODUODENOSCOPY  (EGD);  Surgeon: Irene Shipper, MD;  Location: [facility] ENDOSCOPY;  Service: Endoscopy;  Laterality: N/A;  . ESOPHAGOGASTRODUODENOSCOPY N/A 07/31/2013   Procedure: ESOPHAGOGASTRODUODENOSCOPY (EGD);  Surgeon: Gatha Mayer, MD;  Location: [facility] ENDOSCOPY;  Service: Endoscopy;  Laterality: N/A;  . INTRAOPERATIVE TRANSESOPHAGEAL ECHOCARDIOGRAM N/A 07/18/2013   Procedure: INTRAOPERATIVE TRANSESOPHAGEAL ECHOCARDIOGRAM;  Surgeon: Grace Isaac, MD;  Location: [facility];  Service: Open Heart Surgery;  Laterality: N/A;  . LAPAROSCOPIC GASTRIC BANDING WITH HIATAL HERNIA REPAIR N/A 04/08/2013   Procedure: LAPAROSCOPIC GASTRIC BANDING WITH possible  HIATAL HERNIA REPAIR;  Surgeon: Gayland Curry, MD;  Location: [facility];  Service: General;  Laterality: N/A;  . LITHOTRIPSY    . renal calculi    . SUBXYPHOID PERICARDIAL WINDOW N/A 07/18/2013   Procedure: SUBXYPHOID PERICARDIAL WINDOW;  Surgeon: Grace Isaac, MD;  Location: [facility];  Service: Thoracic;  Laterality: N/A;  . TONSILLECTOMY    . total knee replacment     bilat  . trigger finger repair     also lue, cts surgically repaired  . WISDOM TOOTH EXTRACTION      There were no vitals filed for this visit.      Subjective Assessment - 12/01/16 1154    Subjective No issues to report; was fatigued yesterday so went home and rested.   A little unsteady today.  Patient is accompained by: Family member   Pertinent History asthma, basal cell carcinoma, obesity, gout, depression, HTN, OSA, peripheral neuropathy, CHF, PAF, PE, bil TKA   Limitations Walking   Patient Stated Goals improve walking, balance, wants to drive again and be independent   Currently in Pain? No/denies            [facility] PT Assessment - 12/01/16 1155      Observation/Other Assessments   Focus on Therapeutic Outcomes (FOTO)  50; 50% limited   Other Surveys  Other Surveys   Stroke Impact Scale  Mobility: from 27.8% to 75% at D/C   Fear Avoidance Belief Questionnaire (FABQ)  from 33 to 19  on D/C     6 Minute Walk- Baseline   6 Minute Walk- Baseline yes   BP (mmHg) 125/65   HR (bpm) 70   02 Sat (%RA) 95 %   Modified Borg Scale for Dyspnea 0- Nothing at all   Perceived Rate of Exertion (Borg) 6-     6 Minute walk- Post Test   6 Minute Walk Post Test yes   BP (mmHg) 175/80   HR (bpm) 128   02 Sat (%RA) 88 %   Modified Borg Scale for Dyspnea 7- Severe shortness of breath or very hard breathing   Perceived Rate of Exertion (Borg) 17- Very hard     6 minute walk test results    Aerobic Endurance Distance Walked 1050   Endurance additional comments After 6 minutes of rest: 130/75, 83 bpm, 96% and no SOB                             PT Education - 12/01/16 1230    Education provided Yes   Education Details condensed and re-printed HEP   Person(s) Educated Patient;Spouse   Methods Explanation;Handout   Comprehension Verbalized understanding          PT Short Term Goals - 08/08/16 1452      PT SHORT TERM GOAL #1   Title verbalize understanding of CVA risk factors/warning signs (08/10/16)   Status Achieved     PT SHORT TERM GOAL #2   Title improve timed up and go to < 30 sec for improved mobility (08/10/16)   Status Achieved     PT SHORT TERM GOAL #3   Title improve gait velocity to > 1.32 ft/sec for improved mobility and community access (08/10/16)   Status Achieved     PT SHORT TERM GOAL #4   Title amb > 300' on level indoor/paved outdoor surfaces with LRAD with supervision for improved community access (08/10/16)   Status Achieved     PT SHORT TERM GOAL #5   Title perform BERG with appropriate LTG to be written (08/10/16)   Status Achieved           PT Long Term Goals - 12/01/16 1232      PT LONG TERM GOAL #1   Title independent with HEP    Baseline currently performing 2x/week   Time 4   Period Weeks   Status Achieved     PT LONG TERM GOAL #3   Title Pt will improve DGI score to > or = 19/24 to decrease falls risk during  community gait   Baseline 20/24   Time 4   Period Weeks   Status Achieved     PT LONG TERM GOAL #4   Title amb > 500' on various  indoor/outdoor surfaces and will negotiate up/down 7 stairs with cane and rail with supervision for improved independence and mobility   Baseline pt ambulating into/out of grocery store >500' over indoor and outdoor surfaces, up/down 8 stairs with rail and cane with supervision   Status Achieved     PT LONG TERM GOAL #5   Title Pt will decrease falls risk as indicated by an increase in BERG balance score to > or = 53/56    Baseline 50/56    Status Partially Met     PT LONG TERM GOAL #7   Title Pt will improve endurance during functional mobility as indicated by improvement in 6 min walk test by 200'   Baseline 1050 only improved by 20 feet   Time 4   Period Weeks   Status Not Met               Plan - 2016/12/04 1244    Clinical Impression Statement Continued re-assessment of LTG with 6 min walk and review of HEP.  Pt did meet LTG #1 and is independent with HEP but did not meet 6 min walk goal.  Pt only able to increase distance by 20 feet and endurance continues to be significantly limited by SOB and significant changes in BP and HR; pt does recover quickly.  Overall pt has made excellent progress and has progressed from ambulating with RW with constant hands on assistance for safety/balance to ambulating Mod I with quad cane and short distance inside without AD.  Due to continued cardiopulmonary issues limiting further progress pt to D/C from therapy at this time but will continue medical work up with pulmonology and cardiology and will return to PT at a later date if needed for continued work on balance, gait and endurance.  Pt safe for D/C today from PT.   PT Treatment/Interventions ADLs/Self Care Home Management;Cryotherapy;Electrical Stimulation;Neuromuscular re-education;Balance training;Therapeutic exercise;Therapeutic activities;Functional mobility  training;Gait training;Stair training;Patient/family education;Orthotic Fit/Training;Manual techniques;Vestibular;Energy conservation;Moist Heat;DME Instruction   PT Next Visit Plan D/C today   Consulted and Agree with Plan of Care Patient;Family member/caregiver   Family Member Consulted wife      Patient will benefit from skilled therapeutic intervention in order to improve the following deficits and impairments:  Abnormal gait, Decreased balance, Decreased activity tolerance, Decreased strength, Postural dysfunction, Obesity, Decreased endurance, Decreased knowledge of use of DME, Decreased mobility, Difficulty walking, Dizziness, Decreased coordination, Impaired sensation, Impaired vision/preception, Cardiopulmonary status limiting activity  Visit Diagnosis: Hemiplegia and hemiparesis following cerebral infarction affecting left non-dominant side (HCC)  Other abnormalities of gait and mobility  Muscle weakness (generalized)  Unsteadiness on feet       G-Codes - Dec 04, 2016 1252    Functional Assessment Tool Used (Outpatient Only) Berg 50/56, gait velocity 3.8 ft/sec   Functional Limitation Mobility: Walking and moving around   Mobility: Walking and Moving Around Goal Status [phone]) At least 20 percent but less than 40 percent impaired, limited or restricted   Mobility: Walking and Moving Around Discharge Status [phone]) At least 1 percent but less than 20 percent impaired, limited or restricted      Problem List Patient Active Problem List   Diagnosis Date Noted  . Vocal cord dysfunction 11/22/2016  . Chronic right-sided low back pain with sciatica 11/03/2016  . Benign essential HTN   . 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 Embolic cerebral infarction ([place]) 06/21/2016  . Stroke syndrome ([place])   . OSA (obstructive sleep apnea)   . Dysphagia, post-stroke   . Neurologic  gait disorder   . Encounter for monitoring flecainide therapy 04/28/2016  . Shortness of breath 04/28/2016  .  Recurrent pulmonary emboli ([place]) 06/13/2015  . GERD (gastroesophageal reflux disease) 06/13/2015  . Basal cell carcinoma of skin 06/28/2014  . 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 PAF (paroxysmal atrial fibrillation) ([place]) 07/29/2013  . Chronic anticoagulation 07/22/2013  . Gastric ulcer with hemorrhage 07/20/13 07/22/2013  . Diastolic congestive heart failure ([place]) 07/16/2013  . H/O laparoscopic adjustable gastric banding & hiatal hernia repair 04/08/13 04/23/2013  . Prediabetes 02/05/2013  . TMJ (temporomandibular joint syndrome) 09/11/2012  . Morbid obesity ([place]) 08/09/2012  . Peripheral neuropathy ([place]) 11/02/2009  . Hyperlipidemia with target LDL less than 130 09/10/2008  . GOUT 06/28/2006  . BPH (benign prostatic hyperplasia) 06/28/2006   PHYSICAL THERAPY DISCHARGE SUMMARY  Visits from Start of Care: 40  Current functional level related to goals / functional outcomes: See LTG: pt is MOD I with quad cane for home and community mobility   Remaining deficits: Impaired endurance/activity tolerance, impaired balance, impaired gait   Education / Equipment: HEP  Plan: Patient agrees to discharge.  Patient goals were partially met. Patient is being discharged due to being pleased with the current functional level.  ?????     Raylene Everts, PT, DPT 12/01/16    12:58 PM    [facility] [address] [place], [place], [address] Phone: [phone]   Fax:  [phone]  Name: [name] MRN: [id number] Date of Birth: Oct 23, 1945

## 2016-12-11 DIAGNOSIS — H538 Other visual disturbances: Secondary | ICD-10-CM | POA: Diagnosis not present

## 2016-12-11 DIAGNOSIS — H43813 Vitreous degeneration, bilateral: Secondary | ICD-10-CM | POA: Diagnosis not present

## 2016-12-11 DIAGNOSIS — I639 Cerebral infarction, unspecified: Secondary | ICD-10-CM | POA: Diagnosis not present

## 2016-12-11 DIAGNOSIS — H04129 Dry eye syndrome of unspecified lacrimal gland: Secondary | ICD-10-CM | POA: Diagnosis not present

## 2016-12-12 ENCOUNTER — Encounter: Payer: Self-pay | Admitting: Physical Medicine & Rehabilitation

## 2016-12-12 ENCOUNTER — Ambulatory Visit: Payer: Self-pay | Admitting: Physical Medicine & Rehabilitation

## 2016-12-12 ENCOUNTER — Ambulatory Visit (HOSPITAL_BASED_OUTPATIENT_CLINIC_OR_DEPARTMENT_OTHER): Payer: Medicare Other | Admitting: Physical Medicine & Rehabilitation

## 2016-12-12 ENCOUNTER — Encounter: Payer: Medicare Other | Attending: Physical Medicine & Rehabilitation

## 2016-12-12 VITALS — BP 137/79 | HR 78 | Resp 14

## 2016-12-12 DIAGNOSIS — J3800 Paralysis of vocal cords and larynx, unspecified: Secondary | ICD-10-CM | POA: Diagnosis not present

## 2016-12-12 DIAGNOSIS — Z7901 Long term (current) use of anticoagulants: Secondary | ICD-10-CM | POA: Diagnosis not present

## 2016-12-12 DIAGNOSIS — I10 Essential (primary) hypertension: Secondary | ICD-10-CM | POA: Insufficient documentation

## 2016-12-12 DIAGNOSIS — G463 Brain stem stroke syndrome: Secondary | ICD-10-CM | POA: Insufficient documentation

## 2016-12-12 DIAGNOSIS — M5440 Lumbago with sciatica, unspecified side: Secondary | ICD-10-CM | POA: Diagnosis not present

## 2016-12-12 DIAGNOSIS — G4733 Obstructive sleep apnea (adult) (pediatric): Secondary | ICD-10-CM | POA: Diagnosis not present

## 2016-12-12 DIAGNOSIS — G8929 Other chronic pain: Secondary | ICD-10-CM | POA: Diagnosis not present

## 2016-12-12 DIAGNOSIS — I4891 Unspecified atrial fibrillation: Secondary | ICD-10-CM | POA: Insufficient documentation

## 2016-12-12 NOTE — Progress Notes (Signed)
Subjective:    Patient ID: [name], male    DOB: [birth year], 71 y.o.   MRN: [id number]  HPI Patient without new issues, no new falls. Continues to have hoarseness of voice. No swallowing disorder. Has chronic low back pain since he was a teenager. Cardiology ordered lumbar x-ray which demonstrated L4-5 disc space loss. He is on Eliquis. We discussed treatment options including physical therapy, as well as injections. However, the injections, would require him to stop his anticoagulation  Pain Inventory Average Pain 6 Pain Right Now 3 My pain is intermittent and sharp  In the last 24 hours, has pain interfered with the following? General activity 3 Relation with others 1 Enjoyment of life 3 What TIME of day is your pain at its worst? daytime Sleep (in general) Good  Pain is worse with: walking, bending, sitting and standing Pain improves with: nothing Relief from Meds: 0  Mobility walk with assistance use a cane how many minutes can you walk? 5-10 ability to climb steps?  yes do you drive?  yes  Function retired  Neuro/Psych bowel control problems weakness numbness tingling dizziness  Prior Studies Any changes since last visit?  no  Physicians involved in your care Any changes since last visit?  no   Family History  Problem Relation Age of Onset  . Depression Mother   . Hypertension Mother   . Dementia Mother   . Heart disease Father        MI  . Depression Brother   . Stroke Paternal Aunt        CVA  . Diabetes Paternal Uncle   . Heart disease Paternal Uncle        MI  . Heart disease Paternal Grandmother        MI  . Diabetes Cousin        PATERNAL   Social History   Social History  . Marital status: Married    Spouse name: N/A  . Number of children: 1  . Years of education: N/A   Occupational History  . retired-Manager [employer]    Social History Main Topics  . Smoking status: Former Smoker    Packs/day: 0.20    Years:  13.00    Types: Cigarettes    Start date: 04/03/1961    Quit date: 08/30/1974  . Smokeless tobacco: Never Used  . Alcohol use No  . Drug use: No  . Sexual activity: Not Currently   Other Topics Concern  . None   Social History Narrative   Lives at home w/ his wife   Right-handed   Caffeine: decaf coffee, occasional tea   Past Surgical History:  Procedure Laterality Date  . ARTHROSCOPY KNEE W/ DRILLING    . BREATH TEK H PYLORI N/A 12/23/2012   Procedure: BREATH TEK H PYLORI;  Surgeon: Gayland Curry, MD;  Location: [facility];  Service: General;  Laterality: N/A;  . CHOLECYSTECTOMY  1989  . COLONOSCOPY N/A 07/31/2013   Procedure: COLONOSCOPY;  Surgeon: Gatha Mayer, MD;  Location: [place];  Service: Endoscopy;  Laterality: N/A;  . ESOPHAGOGASTRODUODENOSCOPY N/A 07/22/2013   Procedure: ESOPHAGOGASTRODUODENOSCOPY (EGD);  Surgeon: Irene Shipper, MD;  Location: [facility] ENDOSCOPY;  Service: Endoscopy;  Laterality: N/A;  . ESOPHAGOGASTRODUODENOSCOPY N/A 07/31/2013   Procedure: ESOPHAGOGASTRODUODENOSCOPY (EGD);  Surgeon: Gatha Mayer, MD;  Location: [facility] ENDOSCOPY;  Service: Endoscopy;  Laterality: N/A;  . INTRAOPERATIVE TRANSESOPHAGEAL ECHOCARDIOGRAM N/A 07/18/2013   Procedure: INTRAOPERATIVE TRANSESOPHAGEAL ECHOCARDIOGRAM;  Surgeon: Lilia Argue  Servando Snare, MD;  Location: [place];  Service: Open Heart Surgery;  Laterality: N/A;  . LAPAROSCOPIC GASTRIC BANDING WITH HIATAL HERNIA REPAIR N/A 04/08/2013   Procedure: LAPAROSCOPIC GASTRIC BANDING WITH possible  HIATAL HERNIA REPAIR;  Surgeon: Gayland Curry, MD;  Location: [facility];  Service: General;  Laterality: N/A;  . LITHOTRIPSY    . renal calculi    . SUBXYPHOID PERICARDIAL WINDOW N/A 07/18/2013   Procedure: SUBXYPHOID PERICARDIAL WINDOW;  Surgeon: Grace Isaac, MD;  Location: [place];  Service: Thoracic;  Laterality: N/A;  . TONSILLECTOMY    . total knee replacment     bilat  . trigger finger repair     also lue, cts surgically repaired  . WISDOM  TOOTH EXTRACTION     Past Medical History:  Diagnosis Date  . Anemia 07/29/2013  . Asthma    childhood asthma  . Benign prostatic hypertrophy    several prostate biopsies neg for cancer.   . Bilateral hip pain   . Cancer ([place])   . CHF (congestive heart failure) ([place])   . Complication of anesthesia    MORPHINE CAUSES HALLUCINATIONS  . CVA (cerebral vascular accident) ([place]) 06/2016  . Depression   . FH: colonic polyps 2010  . Gout   . History of kidney stones   . Hypertension   . Kidney cysts   . Morbid obesity ([place])   . Numbness    feet / legs  . PAF (paroxysmal atrial fibrillation) with RVR 07/29/2013  . Sleep apnea    USES C PAP   BP 137/79   Pulse 78   Resp 14   SpO2 95%   Opioid Risk Score:   Fall Risk Score:  `1  Depression screen PHQ 2/9  Depression screen [facility] 2/9 07/28/2016 02/16/2015 03/30/2014  Decreased Interest 0 0 0  Down, Depressed, Hopeless 0 0 0  PHQ - 2 Score 0 0 0  Altered sleeping 0 - -  Tired, decreased energy 1 - -  Change in appetite 0 - -  Feeling bad or failure about yourself  0 - -  Trouble concentrating 1 - -  Moving slowly or fidgety/restless 0 - -  PHQ-9 Score 2 - -  Difficult doing work/chores Not difficult at all - -  Some recent data might be hidden    Review of Systems  Constitutional: Positive for unexpected weight change.  HENT: Negative.   Eyes: Negative.   Respiratory: Positive for apnea and shortness of breath.   Cardiovascular: Positive for leg swelling.  Gastrointestinal: Negative.   Endocrine: Negative.   Genitourinary: Negative.   Musculoskeletal: Positive for back pain and gait problem.  Skin: Negative.   Allergic/Immunologic: Negative.   Neurological: Positive for dizziness, weakness and numbness.       Tingling   Hematological: Negative.   Psychiatric/Behavioral: Negative.   All other systems reviewed and are negative.      Objective:   Physical Exam  Constitutional: He is oriented to person, place, and  time. He appears well-developed and well-nourished.  HENT:  Head: Normocephalic and atraumatic.  Eyes: Pupils are equal, round, and reactive to light. Conjunctivae and EOM are normal.  Neck: Normal range of motion.  Neurological: He is alert and oriented to person, place, and time. Gait abnormal.  Motor strength is 5/5 bilateral deltoid, biceps, triceps, grip, hip flexor, knee extensor, ankle dorsal flexor  Intact finger-nose-finger bilaterally. Intact heel-to-shin bilaterally  Ambulates with a straight cane. No evidence of toe drag or knee  instability  Psychiatric: He has a normal mood and affect.  Nursing note and vitals reviewed.   Positive Romberg falls toward the left side. Extraocular muscles are intact. Lumbar spine has tenderness to palpation bilateral L4-5, S1 area. He has limited range of motion lumbar spine approximately 50% flexion and less than 25% extension.       Assessment & Plan:  1. Left lateral medullary syndrome, wallerberg syndrome, residual hoarseness of voice, as well as balance disorder. 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 We'll be ambulating with a cane, not recommend any yardwork  2. Lumbar degenerative disc may also still lumbar spondylosis. We discussed medial branch blocks. However, he does not feel safe coming off his anticoagulant, and we would need to get okay from neurology, plus minus cardiology. At this time he'll just hold off on any further therapy. We'll discuss this in 6 weeks

## 2016-12-21 ENCOUNTER — Telehealth: Payer: Self-pay | Admitting: Internal Medicine

## 2016-12-21 NOTE — Telephone Encounter (Signed)
Called spoke with patient [name] with no openings, offered OSA follow up APP  Appt scheduled with SG for 9.26.18 @ 1425.  Pt okay with this date and time Patient is enrolled in [facility]  Nothing further needed; will sign off

## 2016-12-26 DIAGNOSIS — Z125 Encounter for screening for malignant neoplasm of prostate: Secondary | ICD-10-CM | POA: Diagnosis not present

## 2016-12-27 ENCOUNTER — Ambulatory Visit (INDEPENDENT_AMBULATORY_CARE_PROVIDER_SITE_OTHER)
Admission: RE | Admit: 2016-12-27 | Discharge: 2016-12-27 | Disposition: A | Discharge status: Home / Self Care | Payer: Medicare Other | Source: Ambulatory Visit | Attending: Acute Care | Admitting: Acute Care

## 2016-12-27 ENCOUNTER — Encounter: Payer: Self-pay | Admitting: Acute Care

## 2016-12-27 ENCOUNTER — Ambulatory Visit (INDEPENDENT_AMBULATORY_CARE_PROVIDER_SITE_OTHER): Payer: Medicare Other | Admitting: Acute Care

## 2016-12-27 VITALS — BP 126/78 | HR 71 | Ht 72.0 in | Wt 358.8 lb

## 2016-12-27 DIAGNOSIS — G4733 Obstructive sleep apnea (adult) (pediatric): Secondary | ICD-10-CM | POA: Diagnosis not present

## 2016-12-27 DIAGNOSIS — G463 Brain stem stroke syndrome: Secondary | ICD-10-CM

## 2016-12-27 DIAGNOSIS — R0602 Shortness of breath: Secondary | ICD-10-CM | POA: Diagnosis not present

## 2016-12-27 NOTE — Assessment & Plan Note (Signed)
Currently compliant with CPAP set pressure 16 cm H2O Recent weight gain of 20 pounds over the last year AHI of 13.1/ last years download indicated AHI of 7 Concern weight gain may be impacting AHI Plan We will place an order to have your machine checked. We will place an order to increase your pressure to 18  cm H2O. Return in 1 month for down Load. If this does not resolve the problem we will need to schedule a CPAP Titration. Continue on CPAP at bedtime. You appear to be benefiting from the treatment Goal is to wear for at least 6 hours each night for maximal clinical benefit. Continue to work on weight loss, as the link between excess weight  and sleep apnea is well established.  Do not drive if sleepy. Remember to clean mask, tubing, filter, and reservoir once weekly with soapy water.  Follow up with [name] or [name]  In  1 month  or before as needed.

## 2016-12-27 NOTE — Assessment & Plan Note (Signed)
Continued complaints of worsening dyspnea over the last 6-8 months Cardiac workup by Dr. Debara Pickett was negative Consult with Dr. Lake Bells scheduled by cardiology( patient was unaware he could see Dr. Annamaria Boots for both OSA and pulmonary issues) Plan CXR today  Pulmonary Function Tests Walk in the office today to ensure you are not dropping your oxygen. Consider Pulmonary Rehab

## 2016-12-27 NOTE — Progress Notes (Signed)
History of Present Illness [name] is a 71 y.o. male former smoker with OSA on CPAP. He is followed by [name].   12/27/2016 Pt. Presents for follow up. Pt presents stating he is doing well on his CPAP therapy.He is compliant with his CPAP at 16 cm H2O. He wears it 100% of the time. He denies daytime sleepiness. He states he has improved daytime focus. He states he has had worsening shortness of breath over the last 4-6 months. Pt. States he has gained 20 pounds over the last year. Patient states she is compliant with his Singulair. He is currently not on any inhaler therapy, maintenance or rescue. He has had a cardiology workup with [name], he stated that this is not a cardiac problem and suggested the referral to pulmonary. There are no pulmonary function tests on file. Patient states he has a follow-up appointment with [name] to evaluate dyspnea. We will  order primary function tests so that [name] has those in hand at his consultation on 01/18/2017. Patient denies fever, chest pain, orthopnea, or hemoptysis.  Test Results: Chest x-ray 12/27/2016>> results pending   Down Load: 11/26/2016 through 12/25/2016 Air stents 10 CPAP Set pressure 16 cm H2O Usage 30 of 30 days or 100% Greater than 4 hours 29 days are 97% Less than 4 hours one day or 3% Average usage days used 8 hours 1 minute AHI 13.1  CBC Latest Ref Rng & Units 07/10/2016 06/22/2016 06/21/2016  WBC 4.0 - 10.5 K/uL 8.7 12.7(H) 9.3  Hemoglobin 13.0 - 17.0 g/dL 13.2 15.2 14.4  Hematocrit 39.0 - 52.0 % 41.0 46.7 45.1  Platelets 150 - 400 K/uL 196 183 160    BMP Latest Ref Rng & Units 10/24/2016 07/12/2016 07/10/2016  Glucose 70 - 99 mg/dL 107(H) 101(H) 118(H)  BUN 6 - 23 mg/dL 18 19 22(H)  Creatinine 0.40 - 1.50 mg/dL 1.09 1.05 1.06  Sodium 135 - 145 mEq/L 142 143 141  Potassium 3.5 - 5.1 mEq/L 3.6 3.6 3.1(L)  Chloride 96 - 112 mEq/L 105 105 105  CO2 19 - 32 mEq/L 31 27 26   Calcium 8.4 - 10.5 mg/dL 8.7  8.5(L) 8.3(L)    BNP    Component Value Date/Time   BNP 88.3 06/14/2015 0345    ProBNP    Component Value Date/Time   PROBNP 131.0 (H) 12/15/2013 2337     Past medical hx Past Medical History:  Diagnosis Date  . Anemia 07/29/2013  . Asthma    childhood asthma  . Benign prostatic hypertrophy    several prostate biopsies neg for cancer.   . Bilateral hip pain   . Cancer ([place])   . CHF (congestive heart failure) ([place])   . Complication of anesthesia    MORPHINE CAUSES HALLUCINATIONS  . CVA (cerebral vascular accident) ([place]) 06/2016  . Depression   . FH: colonic polyps 2010  . Gout   . History of kidney stones   . Hypertension   . Kidney cysts   . Morbid obesity ([place])   . Numbness    feet / legs  . PAF (paroxysmal atrial fibrillation) with RVR 07/29/2013  . Sleep apnea    USES C PAP     Social History  Substance Use Topics  . Smoking status: Former Smoker    Packs/day: 0.20    Years: 13.00    Types: Cigarettes    Start date: 04/03/1961    Quit date: 08/30/1974  . Smokeless tobacco: Never Used  .  Alcohol use No    JerrySchneider reports that he quit smoking about 42 years ago. His smoking use included Cigarettes. He started smoking about 55 years ago. He has a 2.60 pack-year smoking history. He has never used smokeless tobacco. He reports that he does not drink alcohol or use drugs.  Tobacco Cessation: Former Smoker, [name]  Past surgical hx, Family hx, Social hx all reviewed.  Current Outpatient Prescriptions on File Prior to Visit  Medication Sig  . apixaban (ELIQUIS) 5 MG TABS tablet Take 1 tablet (5 mg total) by mouth 2 (two) times daily.  [name] aspirin 81 MG tablet Take 81 mg by mouth 2 (two) times daily.   [name] atorvastatin (LIPITOR) 80 MG tablet Take 1 tablet (80 mg total) by mouth daily.  [name] CALCIUM PO Take 500 mg by mouth 3 (three) times daily.   . Cyanocobalamin (VITAMIN B-12) 1000 MCG SUBL Place 1,000 mcg under the tongue every evening.   [name] Sodium  (DSS) 100 MG CAPS Take 100 mg by mouth every other day.  . finasteride (PROSCAR) 5 MG tablet Take 1 tablet (5 mg total) by mouth daily.  . flecainide (TAMBOCOR) 50 MG tablet Take 1 tablet (50 mg total) by mouth every 12 (twelve) hours.  . furosemide (LASIX) 40 MG tablet TAKE 1 TABLET BY MOUTH EVERY DAY  . KLOR-CON M20 20 MEQ tablet TAKE 2 TABLETS BY MOUTH EVERY DAY  . loratadine (CLARITIN) 10 MG tablet Take 10 mg by mouth daily.  [name] LORazepam (ATIVAN) 0.5 MG tablet Take 1 tablet (0.5 mg total) by mouth at bedtime.  . montelukast (SINGULAIR) 10 MG tablet TAKE 1 TABLET BY MOUTH EVERYDAY AT BEDTIME  . pantoprazole (PROTONIX) 40 MG tablet TAKE 1 TABLET BY MOUTH EVERY DAY  . rOPINIRole (REQUIP) 0.5 MG tablet TAKE 1 TABLET AT BEDTIME  . sertraline (ZOLOFT) 100 MG tablet Take 1.5 tablets (150 mg total) by mouth daily.  . traMADol (ULTRAM) 50 MG tablet Take 1 tablet (50 mg total) by mouth every 8 (eight) hours as needed for severe pain.   No current facility-administered medications on file prior to visit.      Allergies  Allergen Reactions  . Morphine Other (See Comments)    Nausea and Severe hallucinations  . Codeine Nausea Only  . Penicillins Other (See Comments)    LOC with shot  . Propoxyphene N-Acetaminophen Nausea Only    Review Of Systems:  Constitutional:   No  weight loss, night sweats,  Fevers, chills, fatigue, or  lassitude.  HEENT:   No headaches,  Difficulty swallowing,  Tooth/dental problems, or  Sore throat,                No sneezing, itching, ear ache, nasal congestion, post nasal drip,   CV:  No chest pain,  Orthopnea, PND, swelling in lower extremities, anasarca, dizziness, palpitations, syncope.   GI  No heartburn, indigestion, abdominal pain, nausea, vomiting, diarrhea, change in bowel habits, loss of appetite, bloody stools.   Resp: Worsening shortness of breath over the last 6 months with exertion or at rest.  No excess mucus, no productive cough,  No  non-productive cough,  No coughing up of blood.  No change in color of mucus.  No wheezing.  No chest wall deformity  Skin: no rash or lesions.  GU: no dysuria, change in color of urine, no urgency or frequency.  No flank pain, no hematuria   MS:  No joint pain or swelling.  No  decreased range of motion.  No back pain.  Psych:  No change in mood or affect. No depression or anxiety.  No memory loss.   Vital Signs BP 126/78 (BP Location: Left Arm, Cuff Size: Normal)   Pulse 71   Ht 6' (1.829 m)   Wt (!) 358 lb 12.8 oz (162.8 kg)   SpO2 95%   BMI 48.66 kg/m    Physical Exam:  General- No distress,  A&Ox3, pleasant ENT: No sinus tenderness, TM clear, pale nasal mucosa, no oral exudate,no post nasal drip, no LAN Cardiac: S1, S2, regular rate and rhythm, no murmur Chest: No wheeze/ rales/ dullness; no accessory muscle use, no nasal flaring, no sternal retractions Abd.: Soft Non-tender, obese Ext: No clubbing cyanosis, edema Neuro:  normal strength, cranial nerves intact, appropriate Skin: No rashes, warm and dry, no lesions  Psych: normal mood and behavior   Assessment/Plan  OSA (obstructive sleep apnea) Currently compliant with CPAP set pressure 16 cm H2O Recent weight gain of 20 pounds over the last year AHI of 13.1/ last years download indicated AHI of 7 Concern weight gain may be impacting AHI Plan We will place an order to have your machine checked. We will place an order to increase your pressure to 18  cm H2O. Return in 1 month for down Load. If this does not resolve the problem we will need to schedule a CPAP Titration. Continue on CPAP at bedtime. You appear to be benefiting from the treatment Goal is to wear for at least 6 hours each night for maximal clinical benefit. Continue to work on weight loss, as the link between excess weight  and sleep apnea is well established.  Do not drive if sleepy. Remember to clean mask, tubing, filter, and reservoir once weekly  with soapy water.  Follow up with Wael Maestas or [name]  In  1 month  or before as needed.    Morbid obesity (HCC)    Shortness of breath Continued complaints of worsening dyspnea over the last 6-8 months Cardiac workup by [name] was negative Consult with [name] scheduled by cardiology( patient was unaware he could see [name] for both OSA and pulmonary issues) Plan CXR today  Pulmonary Function Tests Walk in the office today to ensure you are not dropping your oxygen. Consider Pulmonary Rehab   Morbid obesity Body mass index is 48.66 kg/m.  Plan Continue to work on weight loss through diet and exercise Consider pulmonary rehabilitation  Magdalen Spatz, NP 12/27/2016  8:48 PM

## 2016-12-27 NOTE — Patient Instructions (Addendum)
It is nice to meet you today. We will place an order to have your machine checked. We will place an order to increase your pressure to 18  cm H2O. Return in 1 month for down Load. If this does not resolve the problem we will need to schedule a CPAP Titration. Continue on CPAP at bedtime. You appear to be benefiting from the treatment Goal is to wear for at least 6 hours each night for maximal clinical benefit. Continue to work on weight loss, as the link between excess weight  and sleep apnea is well established.  Do not drive if sleepy. Remember to clean mask, tubing, filter, and reservoir once weekly with soapy water.  Follow up with Destinae Neubecker or Dr. Annamaria Boots  In  1 month  or before as needed.   For Dyspnea CXR today  Pulmonary Function Tests Walk in the office today to ensure you are not dropping your oxygen. Consider Pulmonary Rehab

## 2017-01-02 ENCOUNTER — Telehealth: Payer: Self-pay | Admitting: Internal Medicine

## 2017-01-02 DIAGNOSIS — N5201 Erectile dysfunction due to arterial insufficiency: Secondary | ICD-10-CM | POA: Diagnosis not present

## 2017-01-02 DIAGNOSIS — R972 Elevated prostate specific antigen [PSA]: Secondary | ICD-10-CM | POA: Diagnosis not present

## 2017-01-02 DIAGNOSIS — N200 Calculus of kidney: Secondary | ICD-10-CM | POA: Diagnosis not present

## 2017-01-02 DIAGNOSIS — N138 Other obstructive and reflux uropathy: Secondary | ICD-10-CM | POA: Diagnosis not present

## 2017-01-02 DIAGNOSIS — Q6102 Congenital multiple renal cysts: Secondary | ICD-10-CM | POA: Diagnosis not present

## 2017-01-02 DIAGNOSIS — N401 Enlarged prostate with lower urinary tract symptoms: Secondary | ICD-10-CM | POA: Diagnosis not present

## 2017-01-02 NOTE — Telephone Encounter (Signed)
The phone call was about confirming his appointment for tomorrow for his PFT, this message can be closed.Marland KitchenMarland Kitchen

## 2017-01-02 NOTE — Telephone Encounter (Signed)
Will close this message 

## 2017-01-03 ENCOUNTER — Ambulatory Visit (INDEPENDENT_AMBULATORY_CARE_PROVIDER_SITE_OTHER): Payer: Medicare Other | Admitting: Internal Medicine

## 2017-01-03 DIAGNOSIS — R0602 Shortness of breath: Secondary | ICD-10-CM

## 2017-01-03 LAB — PULMONARY FUNCTION TEST
DL/VA % pred: 98 %
DL/VA: 4.64 ml/min/mmHg/L
DLCO COR % PRED: 81 %
DLCO COR: 28.63 ml/min/mmHg
DLCO UNC % PRED: 78 %
DLCO UNC: 27.43 ml/min/mmHg
FEF 25-75 Post: 2.26 L/sec
FEF 25-75 Pre: 2.12 L/sec
FEF2575-%Change-Post: 6 %
FEF2575-%PRED-POST: 86 %
FEF2575-%Pred-Pre: 81 %
FEV1-%CHANGE-POST: 1 %
FEV1-%PRED-POST: 86 %
FEV1-%PRED-PRE: 85 %
FEV1-POST: 3 L
FEV1-Pre: 2.96 L
FEV1FVC-%Change-Post: 2 %
FEV1FVC-%Pred-Pre: 101 %
FEV6-%Change-Post: 0 %
FEV6-%Pred-Post: 87 %
FEV6-%Pred-Pre: 88 %
FEV6-POST: 3.92 L
FEV6-PRE: 3.95 L
FEV6FVC-%CHANGE-POST: 0 %
FEV6FVC-%PRED-POST: 104 %
FEV6FVC-%PRED-PRE: 104 %
FVC-%Change-Post: -1 %
FVC-%PRED-POST: 83 %
FVC-%Pred-Pre: 84 %
FVC-Post: 3.94 L
FVC-Pre: 3.98 L
PRE FEV6/FVC RATIO: 99 %
Post FEV1/FVC ratio: 76 %
Post FEV6/FVC ratio: 100 %
Pre FEV1/FVC ratio: 74 %
RV % PRED: 114 %
RV: 2.94 L
TLC % PRED: 96 %
TLC: 7.13 L

## 2017-01-03 NOTE — Progress Notes (Signed)
PFT completed today 01/03/17.

## 2017-01-09 DIAGNOSIS — R490 Dysphonia: Secondary | ICD-10-CM | POA: Diagnosis not present

## 2017-01-09 DIAGNOSIS — J3801 Paralysis of vocal cords and larynx, unilateral: Secondary | ICD-10-CM | POA: Diagnosis not present

## 2017-01-12 ENCOUNTER — Other Ambulatory Visit: Payer: Self-pay | Admitting: Internal Medicine

## 2017-01-12 NOTE — Telephone Encounter (Signed)
Refill request

## 2017-01-12 NOTE — Telephone Encounter (Signed)
Ok to refill.  Dr. H 

## 2017-01-16 ENCOUNTER — Other Ambulatory Visit: Payer: Self-pay | Admitting: Internal Medicine

## 2017-01-17 ENCOUNTER — Ambulatory Visit: Payer: Self-pay | Admitting: Internal Medicine

## 2017-01-17 ENCOUNTER — Other Ambulatory Visit: Payer: Self-pay

## 2017-01-17 NOTE — Telephone Encounter (Signed)
Patient called stating that he had a fall and hurt his back and that he needs a refill on his tramadol.

## 2017-01-18 ENCOUNTER — Institutional Professional Consult (permissible substitution): Payer: Self-pay | Admitting: Pulmonary Disease

## 2017-01-18 MED ORDER — TRAMADOL HCL 50 MG PO TABS: 50.0000 mg | ORAL_TABLET | Freq: Three times a day (TID) | ORAL | 0 refills | Status: DC | PRN

## 2017-01-18 NOTE — Telephone Encounter (Signed)
Called to pharmacy.  [name] notified.

## 2017-01-18 NOTE — Telephone Encounter (Signed)
Please call in tramadol 50 mg 1 by mouth every 8 hours when necessary #20 with no refills

## 2017-01-21 ENCOUNTER — Other Ambulatory Visit: Payer: Self-pay | Admitting: Internal Medicine

## 2017-01-21 DIAGNOSIS — E785 Hyperlipidemia, unspecified: Secondary | ICD-10-CM

## 2017-01-23 ENCOUNTER — Other Ambulatory Visit: Payer: Self-pay | Admitting: Internal Medicine

## 2017-01-23 ENCOUNTER — Encounter: Payer: Self-pay | Admitting: Physical Medicine & Rehabilitation

## 2017-01-23 ENCOUNTER — Ambulatory Visit (HOSPITAL_BASED_OUTPATIENT_CLINIC_OR_DEPARTMENT_OTHER): Payer: Medicare Other | Admitting: Physical Medicine & Rehabilitation

## 2017-01-23 ENCOUNTER — Encounter: Payer: Medicare Other | Attending: Physical Medicine & Rehabilitation

## 2017-01-23 ENCOUNTER — Institutional Professional Consult (permissible substitution): Payer: Self-pay | Admitting: Pulmonary Disease

## 2017-01-23 VITALS — BP 156/80 | HR 70

## 2017-01-23 DIAGNOSIS — I4891 Unspecified atrial fibrillation: Secondary | ICD-10-CM | POA: Insufficient documentation

## 2017-01-23 DIAGNOSIS — E785 Hyperlipidemia, unspecified: Secondary | ICD-10-CM

## 2017-01-23 DIAGNOSIS — Z7901 Long term (current) use of anticoagulants: Secondary | ICD-10-CM | POA: Insufficient documentation

## 2017-01-23 DIAGNOSIS — I10 Essential (primary) hypertension: Secondary | ICD-10-CM | POA: Diagnosis not present

## 2017-01-23 DIAGNOSIS — G4733 Obstructive sleep apnea (adult) (pediatric): Secondary | ICD-10-CM | POA: Diagnosis not present

## 2017-01-23 DIAGNOSIS — G463 Brain stem stroke syndrome: Secondary | ICD-10-CM | POA: Diagnosis not present

## 2017-01-23 MED ORDER — TRAMADOL HCL 50 MG PO TABS: 50.0000 mg | ORAL_TABLET | Freq: Three times a day (TID) | ORAL | 0 refills | Status: DC | PRN

## 2017-01-23 NOTE — Patient Instructions (Signed)
Send note to neurology to get approval for coming off Eliquis, for 3 days in conjunction with medial branch blocks

## 2017-01-23 NOTE — Progress Notes (Signed)
Subjective:    Patient ID: [name], male    DOB: April 18, 1945, 71 y.o.   MRN: [id number]  HPI  [place] at son's place , fell forward  Chronic tailbone pain gymnastic injury,   Pain Inventory Average Pain 7 Pain Right Now 8 My pain is sharp, stabbing and tingling  In the last 24 hours, has pain interfered with the following? General activity 7 Relation with others 5 Enjoyment of life 7 What TIME of day is your pain at its worst? daytime Sleep (in general) Fair  Pain is worse with: walking, bending and standing Pain improves with: medication Relief from Meds: .  Mobility use a cane ability to climb steps?  yes do you drive?  yes  Function retired  Neuro/Psych weakness numbness trouble walking dizziness  Prior Studies Any changes since last visit?  no  Physicians involved in your care Any changes since last visit?  no   Family History  Problem Relation Age of Onset  . Depression Mother   . Hypertension Mother   . Dementia Mother   . Heart disease Father        MI  . Depression Brother   . Stroke Paternal Aunt        CVA  . Diabetes Paternal Uncle   . Heart disease Paternal Uncle        MI  . Heart disease Paternal Grandmother        MI  . Diabetes Cousin        PATERNAL   Social History   Social History  . Marital status: Married    Spouse name: N/A  . Number of children: 1  . Years of education: N/A   Occupational History  . retired-[employer]    Social History Main Topics  . Smoking status: Former Smoker    Packs/day: 0.20    Years: 13.00    Types: Cigarettes    Start date: 04/03/1961    Quit date: 08/30/1974  . Smokeless tobacco: Never Used  . Alcohol use No  . Drug use: No  . Sexual activity: Not Currently   Other Topics Concern  . Not on file   Social History Narrative   Lives at home w/ his wife   Right-handed   Caffeine: decaf coffee, occasional tea   Past Surgical History:  Procedure Laterality Date  .  ARTHROSCOPY KNEE W/ DRILLING    . BREATH TEK H PYLORI N/A 12/23/2012   Procedure: BREATH TEK H PYLORI;  Surgeon: Gayland Curry, MD;  Location: [facility];  Service: General;  Laterality: N/A;  . CHOLECYSTECTOMY  1989  . COLONOSCOPY N/A 07/31/2013   Procedure: COLONOSCOPY;  Surgeon: Gatha Mayer, MD;  Location: [place];  Service: Endoscopy;  Laterality: N/A;  . ESOPHAGOGASTRODUODENOSCOPY N/A 07/22/2013   Procedure: ESOPHAGOGASTRODUODENOSCOPY (EGD);  Surgeon: Irene Shipper, MD;  Location: [facility];  Service: Endoscopy;  Laterality: N/A;  . ESOPHAGOGASTRODUODENOSCOPY N/A 07/31/2013   Procedure: ESOPHAGOGASTRODUODENOSCOPY (EGD);  Surgeon: Gatha Mayer, MD;  Location: [facility];  Service: Endoscopy;  Laterality: N/A;  . INTRAOPERATIVE TRANSESOPHAGEAL ECHOCARDIOGRAM N/A 07/18/2013   Procedure: INTRAOPERATIVE TRANSESOPHAGEAL ECHOCARDIOGRAM;  Surgeon: Grace Isaac, MD;  Location: [place];  Service: Open Heart Surgery;  Laterality: N/A;  . LAPAROSCOPIC GASTRIC BANDING WITH HIATAL HERNIA REPAIR N/A 04/08/2013   Procedure: LAPAROSCOPIC GASTRIC BANDING WITH possible  HIATAL HERNIA REPAIR;  Surgeon: Gayland Curry, MD;  Location: [facility];  Service: General;  Laterality: N/A;  . LITHOTRIPSY    .  renal calculi    . SUBXYPHOID PERICARDIAL WINDOW N/A 07/18/2013   Procedure: SUBXYPHOID PERICARDIAL WINDOW;  Surgeon: Grace Isaac, MD;  Location: [place];  Service: Thoracic;  Laterality: N/A;  . TONSILLECTOMY    . total knee replacment     bilat  . trigger finger repair     also lue, cts surgically repaired  . WISDOM TOOTH EXTRACTION     Past Medical History:  Diagnosis Date  . Anemia 07/29/2013  . Asthma    childhood asthma  . Benign prostatic hypertrophy    several prostate biopsies neg for cancer.   . Bilateral hip pain   . Cancer ([place])   . CHF (congestive heart failure) ([place])   . Complication of anesthesia    MORPHINE CAUSES HALLUCINATIONS  . CVA (cerebral vascular accident) ([place])  06/2016  . Depression   . FH: colonic polyps 2010  . Gout   . History of kidney stones   . Hypertension   . Kidney cysts   . Morbid obesity ([place])   . Numbness    feet / legs  . PAF (paroxysmal atrial fibrillation) with RVR 07/29/2013  . Sleep apnea    USES C PAP   There were no vitals taken for this visit.  Opioid Risk Score:   Fall Risk Score:  `1  Depression screen PHQ 2/9  Depression screen [facility] 2/9 07/28/2016 02/16/2015 03/30/2014  Decreased Interest 0 0 0  Down, Depressed, Hopeless 0 0 0  PHQ - 2 Score 0 0 0  Altered sleeping 0 - -  Tired, decreased energy 1 - -  Change in appetite 0 - -  Feeling bad or failure about yourself  0 - -  Trouble concentrating 1 - -  Moving slowly or fidgety/restless 0 - -  PHQ-9 Score 2 - -  Difficult doing work/chores Not difficult at all - -  Some recent data might be hidden     Review of Systems  Constitutional: Positive for unexpected weight change.  HENT: Negative.   Eyes: Negative.   Respiratory: Negative.   Cardiovascular: Negative.   Gastrointestinal: Negative.   Endocrine: Negative.   Genitourinary: Negative.   Musculoskeletal: Positive for gait problem and joint swelling.  Skin: Negative.   Allergic/Immunologic: Negative.   Hematological: Negative.   Psychiatric/Behavioral: Positive for confusion.  All other systems reviewed and are negative.      Objective:   Physical Exam  Constitutional: He is oriented to person, place, and time. He appears well-developed and well-nourished.  HENT:  Head: Normocephalic and atraumatic.  Eyes: Pupils are equal, round, and reactive to light. Conjunctivae and EOM are normal.  Neck: Normal range of motion.  Neurological: He is alert and oriented to person, place, and time.  Psychiatric: He has a normal mood and affect.  Nursing note and vitals reviewed.  Range of motion cervical spine There is no point tenderness along the thoracic or lumbar spinous processes.  There is no  tenderness over the coccygeal area There is mild tenderness to palpation lumbar paraspinal muscles.  Pinprick sensation reduced right hand and arm, left facial V1  Decreased pinprick bilateral legs     Assessment & Plan:  1.  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  2.  Chronic low back pain some exacerbation after recent falls no point tenderness no indication for imaging studies. Have given an additional prescription for tramadol 50 mg #20 At this point it  does not appear that he will need this on a chronic basis.  If he should we will need to schedule for regular follow-ups, CSA, and UDS  We discussed that medial branch blocks may be helpful for his chronic low back pain but would need the okay from neurology to discontinue Eliquis for 3 days, I have sent a note to neurology for their opinion

## 2017-01-24 ENCOUNTER — Ambulatory Visit: Payer: Self-pay | Admitting: Acute Care

## 2017-01-26 ENCOUNTER — Telehealth: Payer: Self-pay

## 2017-01-26 ENCOUNTER — Encounter: Payer: Self-pay | Admitting: Pulmonary Disease

## 2017-01-26 ENCOUNTER — Ambulatory Visit (INDEPENDENT_AMBULATORY_CARE_PROVIDER_SITE_OTHER): Payer: Medicare Other | Admitting: Pulmonary Disease

## 2017-01-26 ENCOUNTER — Other Ambulatory Visit (INDEPENDENT_AMBULATORY_CARE_PROVIDER_SITE_OTHER): Payer: Medicare Other

## 2017-01-26 VITALS — BP 150/82 | HR 71 | Ht 72.0 in | Wt 360.8 lb

## 2017-01-26 DIAGNOSIS — R0602 Shortness of breath: Secondary | ICD-10-CM

## 2017-01-26 DIAGNOSIS — G463 Brain stem stroke syndrome: Secondary | ICD-10-CM | POA: Diagnosis not present

## 2017-01-26 DIAGNOSIS — G4733 Obstructive sleep apnea (adult) (pediatric): Secondary | ICD-10-CM

## 2017-01-26 LAB — CBC WITH DIFFERENTIAL/PLATELET
BASOS PCT: 1 % (ref 0.0–3.0)
Basophils Absolute: 0.1 10*3/uL (ref 0.0–0.1)
EOS PCT: 2 % (ref 0.0–5.0)
Eosinophils Absolute: 0.2 10*3/uL (ref 0.0–0.7)
HCT: 45.5 % (ref 39.0–52.0)
Hemoglobin: 14.8 g/dL (ref 13.0–17.0)
LYMPHS ABS: 1.7 10*3/uL (ref 0.7–4.0)
Lymphocytes Relative: 16 % (ref 12.0–46.0)
MCHC: 32.5 g/dL (ref 30.0–36.0)
MCV: 88.8 fl (ref 78.0–100.0)
MONO ABS: 1 10*3/uL (ref 0.1–1.0)
Monocytes Relative: 9.8 % (ref 3.0–12.0)
NEUTROS ABS: 7.4 10*3/uL (ref 1.4–7.7)
NEUTROS PCT: 71.2 % (ref 43.0–77.0)
PLATELETS: 225 10*3/uL (ref 150.0–400.0)
RBC: 5.13 Mil/uL (ref 4.22–5.81)
RDW: 14.5 % (ref 11.5–15.5)
WBC: 10.5 10*3/uL (ref 4.0–10.5)

## 2017-01-26 LAB — BASIC METABOLIC PANEL
BUN: 19 mg/dL (ref 6–23)
CALCIUM: 8.9 mg/dL (ref 8.4–10.5)
CO2: 33 mEq/L — ABNORMAL HIGH (ref 19–32)
CREATININE: 1.06 mg/dL (ref 0.40–1.50)
Chloride: 103 mEq/L (ref 96–112)
GFR: 73.18 mL/min (ref >60.00)
Glucose, Bld: 92 mg/dL (ref 70–99)
Potassium: 3.7 mEq/L (ref 3.5–5.1)
SODIUM: 142 meq/L (ref 135–145)

## 2017-01-26 LAB — BRAIN NATRIURETIC PEPTIDE: PRO B NATRI PEPTIDE: 185 pg/mL — AB (ref 0.0–100.0)

## 2017-01-26 NOTE — Addendum Note (Signed)
Addended by: Maryanna Shape A on: 01/26/2017 04:20 PM   Modules accepted: Orders

## 2017-01-26 NOTE — Progress Notes (Signed)
Subjective:    Patient ID: [name], male    DOB: [birth year], 71 y.o.   MRN: [id number]  Synopsis: referred in 2018 for evaluation for cough.  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    HPI Chief Complaint  Patient presents with  . Advice Only    Referred by Dr. Debara [name] due to SOB on exertion. Stated he had a stroke 06/19/16. Pt had a PFT done 01/03/17. With pt's SOB, he can walk from the house to the car and is so SOB from that. Denies any cough or CP.    [name] is here to see me for evaluation of shortness of breath.  He had a severe hospitlaization in 2015 with CHF, PE, GI bleeding and "liver problems".  He recovered after that but now he is worse.  Dypsnea: > just walking a few feet uphill will make him short of breath > he has noticed it since April when he had his stroke > he was hospitalized form 3/19 through late April 2018 > he noticed the dyspnea while doing rehab > when he is short of breath he feels like he can't get enough air > no associated cough > he feels like his strength is still not back to where it was before the stroke.  He can't exercise much.  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 Benjamine [name] > he tells me that his R vocal cord is fixed open position > he was supposed to be referred   Past Medical History:  Diagnosis Date  . Anemia 07/29/2013  . Asthma    childhood asthma  . Benign prostatic hypertrophy    several prostate biopsies neg for cancer.   . Bilateral hip pain   . Cancer ([place])   . CHF (congestive heart failure) ([place])   . Complication of anesthesia    MORPHINE CAUSES HALLUCINATIONS  . CVA (cerebral vascular accident) ([place]) 06/2016  . Depression   . FH: colonic polyps 2010  .  Gout   . History of kidney stones   . Hypertension   . Kidney cysts   . Morbid obesity ([place])   . Numbness    feet / legs  . PAF (paroxysmal atrial fibrillation) with RVR 07/29/2013  . Sleep apnea    USES C PAP     Family History  Problem Relation Age of Onset  . Depression Mother   . Hypertension Mother   . Dementia Mother   . Heart disease Father        MI  . Depression Brother   . Stroke Paternal Aunt        CVA  . Diabetes Paternal Uncle   . Heart disease Paternal Uncle        MI  . Heart disease Paternal Grandmother        MI  . Diabetes Cousin        PATERNAL     Social History   Social History  . Marital status: Married    Spouse name: N/A  . Number of children: 1  . Years of education: N/A   Occupational History  . retired-Manager [employer]    Social History Main Topics  .  Smoking status: Former Smoker    Packs/day: 0.20    Years: 13.00    Types: Cigarettes    Start date: 04/03/1961    Quit date: 08/30/1974  . Smokeless tobacco: Never Used  . Alcohol use No  . Drug use: No  . Sexual activity: Not Currently   Other Topics Concern  . Not on file   Social History Narrative   Lives at home w/ his wife   Right-handed   Caffeine: decaf coffee, occasional tea     Allergies  Allergen Reactions  . Morphine Other (See Comments)    Nausea and Severe hallucinations  . Codeine Nausea Only  . Penicillins Other (See Comments)    LOC with shot  . Propoxyphene N-Acetaminophen Nausea Only     Outpatient Medications Prior to Visit  Medication Sig Dispense Refill  . aspirin 81 MG tablet Take 81 mg by mouth 2 (two) times daily.     [name] atorvastatin (LIPITOR) 80 MG tablet TAKE 1 TABLET EVERY DAY 90 tablet 1  . CALCIUM PO Take 500 mg by mouth 3 (three) times daily.     . Cyanocobalamin (VITAMIN B-12) 1000 MCG SUBL Place 1,000 mcg under the tongue every evening.     [name] Sodium (DSS) 100 MG CAPS Take 100 mg by mouth every other day.    [name] ELIQUIS 5 MG  TABS tablet TAKE 1 TABLET BY MOUTH TWICE DAILY 180 tablet 0  . finasteride (PROSCAR) 5 MG tablet Take 1 tablet (5 mg total) by mouth daily. 30 tablet 1  . flecainide (TAMBOCOR) 50 MG tablet Take 1 tablet (50 mg total) by mouth every 12 (twelve) hours. 180 tablet 2  . furosemide (LASIX) 40 MG tablet TAKE 1 TABLET BY MOUTH EVERY DAY 90 tablet 1  . KLOR-CON M20 20 MEQ tablet TAKE 2 TABLETS BY MOUTH EVERY DAY 180 tablet 0  . loratadine (CLARITIN) 10 MG tablet Take 10 mg by mouth daily.    [name] LORazepam (ATIVAN) 0.5 MG tablet Take 1 tablet (0.5 mg total) by mouth at bedtime. 30 tablet 2  . montelukast (SINGULAIR) 10 MG tablet TAKE 1 TABLET BY MOUTH EVERYDAY AT BEDTIME 30 tablet 5  . pantoprazole (PROTONIX) 40 MG tablet TAKE 1 TABLET BY MOUTH EVERY DAY 90 tablet 1  . rOPINIRole (REQUIP) 0.5 MG tablet TAKE 1 TABLET AT BEDTIME 90 tablet 1  . sertraline (ZOLOFT) 100 MG tablet Take 1.5 tablets (150 mg total) by mouth daily. 45 tablet 2  . traMADol (ULTRAM) 50 MG tablet Take 1 tablet (50 mg total) by mouth every 8 (eight) hours as needed for severe pain. 20 tablet 0  . atorvastatin (LIPITOR) 80 MG tablet TAKE 1 TABLET EVERY DAY 90 tablet 1  . traMADol (ULTRAM) 50 MG tablet Take 1 tablet (50 mg total) by mouth every 8 (eight) hours as needed for severe pain. 20 tablet 0   No facility-administered medications prior to visit.       Review of Systems  Constitutional: Negative for fever and unexpected weight change.  HENT: Positive for trouble swallowing. Negative for congestion, dental problem, ear pain, nosebleeds, postnasal drip, rhinorrhea, sinus pressure, sneezing and sore throat.   Eyes: Negative for redness and itching.  Respiratory: Positive for shortness of breath and wheezing. Negative for cough and chest tightness.   Cardiovascular: Positive for leg swelling. Negative for palpitations.  Gastrointestinal: Negative for nausea and vomiting.  Genitourinary: Negative for dysuria.  Musculoskeletal:  Negative for joint swelling.  Skin: Negative  for rash.  Allergic/Immunologic: Negative.  Negative for environmental allergies, food allergies and immunocompromised state.  Neurological: Negative for headaches.  Hematological: Bruises/bleeds easily.  Psychiatric/Behavioral: Positive for dysphoric mood. The patient is nervous/anxious.        Objective:   Physical Exam  Vitals:   01/26/17 1540  BP: (!) 150/82  Pulse: 71  SpO2: 98%  Weight: (!) 360 lb 12.8 oz (163.7 kg)  Height: 6' (1.829 m)   Gen: obese chronically ill appearing, no acute distress HENT: NCAT, OP clear, neck supple without masses Eyes: PERRL, EOMi Lymph: no cervical lymphadenopathy PULM: CTA B CV: RRR, no mgr, no JVD GI: BS+, soft, nontender, no hsm Derm: no rash or skin breakdown MSK: normal bulk and tone Neuro: A&Ox4, CN II-XII intact, strength 5/5 in all 4 extremities Psyche: normal mood and affect   CBC    Component Value Date/Time   WBC 8.7 07/10/2016 0621   RBC 4.57 07/10/2016 0621   HGB 13.2 07/10/2016 0621   HCT 41.0 07/10/2016 0621   PLT 196 07/10/2016 0621   MCV 89.7 07/10/2016 0621   MCH 28.9 07/10/2016 0621   MCHC 32.2 07/10/2016 0621   RDW 14.4 07/10/2016 0621   LYMPHSABS 1.9 07/10/2016 0621   MONOABS 0.7 07/10/2016 0621   EOSABS 0.2 07/10/2016 0621   BASOSABS 0.0 07/10/2016 0621   BMET    Component Value Date/Time   NA 142 10/24/2016 1337   K 3.6 10/24/2016 1337   CL 105 10/24/2016 1337   CO2 31 10/24/2016 1337   GLUCOSE 107 (H) 10/24/2016 1337   BUN 18 10/24/2016 1337   CREATININE 1.09 10/24/2016 1337   CREATININE 1.12 12/05/2012 1406   CALCIUM 8.7 10/24/2016 1337   GFRNONAA >60 07/12/2016 0557   GFRAA >60 07/12/2016 0557   Pulmonary function test: October 2018 ratio normal, FEV1 3.0 L 86% predicted, forced vital capacity 3.94 L 83% predicted, total lung capacity 7.1 396% predicted, DLCO 27.4 78% predicted  Chest imaging: September 2018 chest x-ray: Incomplete view of  the lungs, I question bronchitis changes in the bases 2015 CT chest images independently reviewed showing a moderate size pleural effusion on the left, no pulmonary parenchymal abnormality otherwise.  Echocardiogram: March 2018 LV EF 60-65%, mild LVH, aortic valve normal, mitral valve normal, mild dilation of the left atrium, normal RV size and function  Nuclear stress test: February 2018 low risk study      Assessment & Plan:   SOB (shortness of breath) - Plan: CBC w/Diff  Shortness of breath  OSA (obstructive sleep apnea)  Morbid obesity ([place])  Discussion: [name] is here to see me for evaluation of shortness of breath in the setting of a recent stroke.  He has morbid obesity and is quite deconditioned so this may be the only cause for his shortness of breath.  However, with his vocal cord dysfunction (partial paralysis) I suspect he may have recurrent aspiration which can lead to fibrosis.  I am pleased that his lung function test was relatively normal.  I am also pleased that his cardiac exam this year was within normal limits.  Despite the normal cardiac exam he does have quite a bit of fluid in his legs, so perhaps venous insufficiency or some degree of liver disease is contributing to this and making it harder for him to get around.  I am going to try to diurese him aggressively for the next 4-5 days and then have him come back and see if this is helped  with the shortness of breath.  Plan: For shortness of breath We will arrange for a chest CT scan to look at your lung tissue Exercise as much as possible and try to lose weight We will check labs: CBC, BMET, BNP  For weight loss: The following behaviors have been associated with weight loss: Weigh yourself daily Write down everything you eat Drink a glass of water prior to eating a meal Only eat when you are hungry Buy food from the periphery of the grocery store, not the middle We will refer you to the medical obesity  clinic  For vocal cord paralysis: I think it is very reasonable for you to consider this surgery over at [facility] I will request records from Dr. Deeann Saint office  For the fluid in your legs Take lasix twice a day for 5 days, then daily  We will see you back in 1 week or sooner if needed    Current Outpatient Prescriptions:  .  aspirin 81 MG tablet, Take 81 mg by mouth 2 (two) times daily. , Disp: , Rfl:  .  atorvastatin (LIPITOR) 80 MG tablet, TAKE 1 TABLET EVERY DAY, Disp: 90 tablet, Rfl: 1 .  CALCIUM PO, Take 500 mg by mouth 3 (three) times daily. , Disp: , Rfl:  .  Cyanocobalamin (VITAMIN B-12) 1000 MCG SUBL, Place 1,000 mcg under the tongue every evening. , Disp: , Rfl:  .  Docusate Sodium (DSS) 100 MG CAPS, Take 100 mg by mouth every other day., Disp: , Rfl:  .  ELIQUIS 5 MG TABS tablet, TAKE 1 TABLET BY MOUTH TWICE DAILY, Disp: 180 tablet, Rfl: 0 .  finasteride (PROSCAR) 5 MG tablet, Take 1 tablet (5 mg total) by mouth daily., Disp: 30 tablet, Rfl: 1 .  flecainide (TAMBOCOR) 50 MG tablet, Take 1 tablet (50 mg total) by mouth every 12 (twelve) hours., Disp: 180 tablet, Rfl: 2 .  furosemide (LASIX) 40 MG tablet, TAKE 1 TABLET BY MOUTH EVERY DAY, Disp: 90 tablet, Rfl: 1 .  KLOR-CON M20 20 MEQ tablet, TAKE 2 TABLETS BY MOUTH EVERY DAY, Disp: 180 tablet, Rfl: 0 .  loratadine (CLARITIN) 10 MG tablet, Take 10 mg by mouth daily., Disp: , Rfl:  .  LORazepam (ATIVAN) 0.5 MG tablet, Take 1 tablet (0.5 mg total) by mouth at bedtime., Disp: 30 tablet, Rfl: 2 .  montelukast (SINGULAIR) 10 MG tablet, TAKE 1 TABLET BY MOUTH EVERYDAY AT BEDTIME, Disp: 30 tablet, Rfl: 5 .  pantoprazole (PROTONIX) 40 MG tablet, TAKE 1 TABLET BY MOUTH EVERY DAY, Disp: 90 tablet, Rfl: 1 .  rOPINIRole (REQUIP) 0.5 MG tablet, TAKE 1 TABLET AT BEDTIME, Disp: 90 tablet, Rfl: 1 .  sertraline (ZOLOFT) 100 MG tablet, Take 1.5 tablets (150 mg total) by mouth daily., Disp: 45 tablet, Rfl: 2 .  traMADol (ULTRAM) 50 MG tablet,  Take 1 tablet (50 mg total) by mouth every 8 (eight) hours as needed for severe pain., Disp: 20 tablet, Rfl: 0

## 2017-01-26 NOTE — Addendum Note (Signed)
Addended by: Maryanna Shape A on: 01/26/2017 04:58 PM   Modules accepted: Orders

## 2017-01-26 NOTE — Patient Instructions (Addendum)
For shortness of breath We will arrange for a chest CT scan to look at your lung tissue Exercise as much as possible and try to lose weight We will check labs: CBC, BMET, BNP  For weight loss: The following behaviors have been associated with weight loss: Weigh yourself daily Write down everything you eat Drink a glass of water prior to eating a meal Only eat when you are hungry Buy food from the periphery of the grocery store, not the middle We will refer you to the medical obesity clinic  For vocal cord paralysis: I think it is very reasonable for you to consider this surgery over at [facility] I will request records from Dr. Deeann Saint office  For the fluid in your legs Take lasix twice a day for 5 days, then daily  We will see you back in 1 week or sooner if needed

## 2017-01-26 NOTE — Telephone Encounter (Signed)
Medical release form has been faxed to [facility] requesting records. Received successful fax. Will route to [place] to f/u on.

## 2017-01-27 ENCOUNTER — Other Ambulatory Visit: Payer: Self-pay | Admitting: Internal Medicine

## 2017-01-29 NOTE — Addendum Note (Signed)
Addended by: Maryanna Shape A on: 01/29/2017 09:45 AM   Modules accepted: Orders

## 2017-01-30 ENCOUNTER — Telehealth: Payer: Self-pay | Admitting: Internal Medicine

## 2017-01-30 NOTE — Telephone Encounter (Signed)
Spoke with patient and let him know

## 2017-01-30 NOTE — Telephone Encounter (Signed)
He needs to request an approval from his insurance company to fill early.

## 2017-01-30 NOTE — Telephone Encounter (Signed)
Pt called stating that he went and picked up his prescription atorvastatin (LIPITOR) 80 MG tablet and when he got home he cannot find it. He said that they have been looking for it for over a week but can't find it anywhere. The pharmacy is going to fill a 7 day emergency supply to help get him through but he does not know what to do about the rest of the prescription. Please advise.

## 2017-01-31 ENCOUNTER — Ambulatory Visit (INDEPENDENT_AMBULATORY_CARE_PROVIDER_SITE_OTHER): Payer: Medicare Other | Admitting: Acute Care

## 2017-01-31 ENCOUNTER — Encounter: Payer: Self-pay | Admitting: Acute Care

## 2017-01-31 VITALS — BP 150/70 | HR 91

## 2017-01-31 DIAGNOSIS — Z23 Encounter for immunization: Secondary | ICD-10-CM | POA: Diagnosis not present

## 2017-01-31 DIAGNOSIS — J383 Other diseases of vocal cords: Secondary | ICD-10-CM

## 2017-01-31 DIAGNOSIS — R0602 Shortness of breath: Secondary | ICD-10-CM | POA: Diagnosis not present

## 2017-01-31 DIAGNOSIS — G4733 Obstructive sleep apnea (adult) (pediatric): Secondary | ICD-10-CM | POA: Diagnosis not present

## 2017-01-31 DIAGNOSIS — G463 Brain stem stroke syndrome: Secondary | ICD-10-CM

## 2017-01-31 NOTE — Progress Notes (Signed)
History of Present Illness [name] is a 71 y.o. male with cough and dyspnea  and OSA. He is followed by [name] for cough and dyspnea, and [name] for obstructive sleep apnea with CPAP therapy..  Synopsis: referred in 2018 for evaluation for cough.  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    01/31/2017 Follow up: Pt. Was seen as consult for dyspnea by [name] 01/26/2017. Plan after that visit was CT chest which will be done February 02, 2017.  Additionally he was encouraged to exercise, work on weight loss, consider surgery for vocal cord paralysis at [facility], and take Lasix twice daily for 5 days, then return to once daily as is his regular schedule.  [name] will follow up regarding these issues in 1 week.  The patient was last seen regarding his OSA on 12/27/2016.  At that time his AHI was 13.1 we made adjustments to his CPAP by increasing his set pressure from 16 cm H2O to 18 cm H2O.  He is returning today for evaluation of download.  Patient states he doubled up on his Lasix times 5 days and his return to the once daily dosing.  He said that this has not made any significant difference in his leg swelling or his shortness of breath.  CT scan is scheduled for November 2 with follow-up with [name] on November 5.  Download results are below.  Patient denies fever, chest pain, orthopnea, or hemoptysis.   Test Results: Down Load: Set pressure of 18 cm of water Usage days 30 out of 30 or 100% Writer than 4 hours 30 days of 30 days or 100% Less than 4 hours 0 days Average usage 9 hours and 33 minutes AHI 12.4   Pulmonary function test: October 2018 ratio normal, FEV1 3.0 L 86% predicted, forced vital capacity 3.94 L 83% predicted, total lung capacity 7.1 396% predicted, DLCO 27.4 78% predicted  Chest imaging: September 2018 chest x-ray: Incomplete view of  the lungs, I question bronchitis changes in the bases 2015 CT chest images independently reviewed showing a moderate size pleural effusion on the left, no pulmonary parenchymal abnormality otherwise.  Echocardiogram: March 2018 LV EF 60-65%, mild LVH, aortic valve normal, mitral valve normal, mild dilation of the left atrium, normal RV size and function  Nuclear stress test: February 2018 low risk study   CBC Latest Ref Rng & Units 01/26/2017 07/10/2016 06/22/2016  WBC 4.0 - 10.5 K/uL 10.5 8.7 12.7(H)  Hemoglobin 13.0 - 17.0 g/dL 14.8 13.2 15.2  Hematocrit 39.0 - 52.0 % 45.5 41.0 46.7  Platelets 150.0 - 400.0 K/uL 225.0 196 183    BMP Latest Ref Rng & Units 01/26/2017 10/24/2016 07/12/2016  Glucose 70 - 99 mg/dL 92 107(H) 101(H)  BUN 6 - 23 mg/dL 19 18 19   Creatinine 0.40 - 1.50 mg/dL 1.06 1.09 1.05  Sodium 135 - 145 mEq/L 142 142 143  Potassium 3.5 - 5.1 mEq/L 3.7 3.6 3.6  Chloride 96 - 112 mEq/L 103 105 105  CO2 19 - 32 mEq/L 33(H) 31 27  Calcium 8.4 - 10.5 mg/dL 8.9 8.7 8.5(L)    BNP    Component Value Date/Time   BNP 88.3 06/14/2015 0345    ProBNP    Component Value Date/Time   PROBNP 185.0 (H) 01/26/2017 1638    PFT    Component Value Date/Time   FEV1PRE 2.96  01/03/2017 1403   FEV1POST 3.00 01/03/2017 1403   FVCPRE 3.98 01/03/2017 1403   FVCPOST 3.94 01/03/2017 1403   TLC 7.13 01/03/2017 1403   DLCOUNC 27.43 01/03/2017 1403   PREFEV1FVCRT 74 01/03/2017 1403   PSTFEV1FVCRT 76 01/03/2017 1403    No results found.   Past medical hx Past Medical History:  Diagnosis Date  . Anemia 07/29/2013  . Asthma    childhood asthma  . Benign prostatic hypertrophy    several prostate biopsies neg for cancer.   . Bilateral hip pain   . Cancer ([place])   . CHF (congestive heart failure) ([place])   . Complication of anesthesia    MORPHINE CAUSES HALLUCINATIONS  . CVA (cerebral vascular accident) ([place]) 06/2016  . Depression   . FH: colonic polyps 2010  . Gout   .  History of kidney stones   . Hypertension   . Kidney cysts   . Morbid obesity ([place])   . Numbness    feet / legs  . PAF (paroxysmal atrial fibrillation) with RVR 07/29/2013  . Sleep apnea    USES C PAP     Social History  Substance Use Topics  . Smoking status: Former Smoker    Packs/day: 0.20    Years: 13.00    Types: Cigarettes    Start date: 04/03/1961    Quit date: 08/30/1974  . Smokeless tobacco: Never Used  . Alcohol use No    [name] reports that he quit smoking about 42 years ago. His smoking use included Cigarettes. He started smoking about 55 years ago. He has a 2.60 pack-year smoking history. He has never used smokeless tobacco. He reports that he does not drink alcohol or use drugs.  Tobacco Cessation: Former smoker quit 08/30/1974 with a 2.6-pack-year smoking history  Past surgical hx, Family hx, Social hx all reviewed.  Current Outpatient Prescriptions on File Prior to Visit  Medication Sig  . aspirin 81 MG tablet Take 81 mg by mouth 2 (two) times daily.   [name] atorvastatin (LIPITOR) 80 MG tablet TAKE 1 TABLET EVERY DAY  . CALCIUM PO Take 500 mg by mouth 3 (three) times daily.   . Cyanocobalamin (VITAMIN B-12) 1000 MCG SUBL Place 1,000 mcg under the tongue every evening.   [name] Sodium (DSS) 100 MG CAPS Take 100 mg by mouth every other day.  [name] ELIQUIS 5 MG TABS tablet TAKE 1 TABLET BY MOUTH TWICE DAILY  . finasteride (PROSCAR) 5 MG tablet Take 1 tablet (5 mg total) by mouth daily.  . flecainide (TAMBOCOR) 50 MG tablet Take 1 tablet (50 mg total) by mouth every 12 (twelve) hours.  . furosemide (LASIX) 40 MG tablet TAKE 1 TABLET BY MOUTH EVERY DAY  . KLOR-CON M20 20 MEQ tablet TAKE 2 TABLETS BY MOUTH EVERY DAY  . loratadine (CLARITIN) 10 MG tablet Take 10 mg by mouth daily.  [name] LORazepam (ATIVAN) 0.5 MG tablet Take 1 tablet (0.5 mg total) by mouth at bedtime.  . montelukast (SINGULAIR) 10 MG tablet TAKE 1 TABLET BY MOUTH EVERYDAY AT BEDTIME  . pantoprazole  (PROTONIX) 40 MG tablet TAKE 1 TABLET BY MOUTH EVERY DAY  . rOPINIRole (REQUIP) 0.5 MG tablet TAKE 1 TABLET AT BEDTIME  . sertraline (ZOLOFT) 100 MG tablet Take 1.5 tablets (150 mg total) by mouth daily.  . traMADol (ULTRAM) 50 MG tablet Take 1 tablet (50 mg total) by mouth every 8 (eight) hours as needed for severe pain.   No current facility-administered medications on file  prior to visit.      Allergies  Allergen Reactions  . Morphine Other (See Comments)    Nausea and Severe hallucinations  . Codeine Nausea Only  . Penicillins Other (See Comments)    LOC with shot  . Propoxyphene N-Acetaminophen Nausea Only    Review Of Systems:  Constitutional:   No  weight loss, night sweats,  Fevers, chills, fatigue, or  lassitude.  HEENT:   No headaches,  Difficulty swallowing,  Tooth/dental problems, or  Sore throat,                No sneezing, itching, ear ache, nasal congestion, post nasal drip, + for trouble swallowing  CV:  No chest pain,  Orthopnea, PND, + swelling in lower extremities, anasarca, dizziness, palpitations, syncope.   GI  No heartburn, indigestion, abdominal pain, nausea, vomiting, diarrhea, change in bowel habits, loss of appetite, bloody stools.   Resp: +  shortness of breath with exertion  at rest.  No excess mucus, no productive cough,  No non-productive cough,  No coughing up of blood.  No change in color of mucus.  No wheezing.  No chest wall deformity  Skin: no rash or lesions, easily bruises.  GU: no dysuria, change in color of urine, no urgency or frequency.  No flank pain, no hematuria   MS:  No joint pain or swelling.  No decreased range of motion.  No back pain.  Psych:  No change in mood or affect. No depression or anxiety.  No memory loss.   Vital Signs BP (!) 150/70 (BP Location: Left Arm, Cuff Size: Normal)   Pulse 91   SpO2 98%    Physical Exam:  General- No distress,  A&Ox3, pleasant ENT: No sinus tenderness, TM clear, pale nasal mucosa,  no oral exudate,no post nasal drip, no LAN Cardiac: S1, S2, regular rate and rhythm, no murmur Chest: No wheeze/ rales/ dullness; no accessory muscle use, no nasal flaring, no sternal retractions, diminished per bases Abd.: Soft Non-tender, nondistended, obese Ext: No clubbing cyanosis, edema Neuro:  normal strength Skin: No rashes, lesions ,warm and dry Psych: Anxious   Assessment/Plan  OSA (obstructive sleep apnea) Per download AHI 12.4 Increase in pressure did not seem to improve AHI Plan We will change your CPAP setting to Auto Pap 10 cm H2O-20 cm H2O. We will arrange for a mask fitting with Lynnae Sandhoff. Follow up in 45 days after pressure change has been made, and mask fitting has been done. Continue on CPAP at bedtime. You appear to be benefiting from the treatment Goal is to wear for at least 6 hours each night for maximal clinical benefit. Continue to work on weight loss, as the link between excess weight  and sleep apnea is well established.  Do not drive if sleepy. Remember to clean mask, tubing, filter, and reservoir once weekly with soapy water.    Vocal cord dysfunction Patient is encouraged to consider vocal cord surgery at [place] of breath Continue Lasix once daily CT scan November 2 as a scheduled Follow-up with Dr. Curt Jews 02/05/2017 as a scheduled Please contact office for sooner follow up if symptoms do not improve or worsen or seek emergency care   Morbid obesity ([place]) Continue to work on weight loss    Magdalen Spatz, NP 01/31/2017  7:24 PM

## 2017-01-31 NOTE — Assessment & Plan Note (Signed)
Patient is encouraged to consider vocal cord surgery at [facility]

## 2017-01-31 NOTE — Patient Instructions (Addendum)
It is nice to meet you today. You are scheduled for your CT chest 02/02/2017. We will change your CPAP setting to Auto Pap 10 cm H2O-20 cm H2O. We will arrange for a mask fitting with Lynnae Sandhoff. Follow up in 45 days after pressure change has been made, and mask fitting has been done. Continue on CPAP at bedtime. You appear to be benefiting from the treatment Goal is to wear for at least 6 hours each night for maximal clinical benefit. Continue to work on weight loss, as the link between excess weight  and sleep apnea is well established.  Do not drive if sleepy. Remember to clean mask, tubing, filter, and reservoir once weekly with soapy water.  Continue lasix once daily as Dr Lake Bells advised  Follow up with Dr. Lake Bells 02/05/2017 as is scheduled. Please contact office for sooner follow up if symptoms do not improve or worsen or seek emergency care  Please see Dr. Ronnald Ramp about blood pressure, and resuming your blood pressure medications.

## 2017-01-31 NOTE — Assessment & Plan Note (Signed)
Per download AHI 12.4 Increase in pressure did not seem to improve AHI Plan We will change your CPAP setting to Auto Pap 10 cm H2O-20 cm H2O. We will arrange for a mask fitting with Lynnae Sandhoff. Follow up in 45 days after pressure change has been made, and mask fitting has been done. Continue on CPAP at bedtime. You appear to be benefiting from the treatment Goal is to wear for at least 6 hours each night for maximal clinical benefit. Continue to work on weight loss, as the link between excess weight  and sleep apnea is well established.  Do not drive if sleepy. Remember to clean mask, tubing, filter, and reservoir once weekly with soapy water.

## 2017-01-31 NOTE — Assessment & Plan Note (Signed)
Continue to work on weight loss 

## 2017-01-31 NOTE — Assessment & Plan Note (Signed)
Continue Lasix once daily CT scan November 2 as a scheduled Follow-up with Dr. Curt Jews 02/05/2017 as a scheduled Please contact office for sooner follow up if symptoms do not improve or worsen or seek emergency care

## 2017-02-01 ENCOUNTER — Telehealth: Payer: Self-pay | Admitting: Pulmonary Disease

## 2017-02-01 ENCOUNTER — Ambulatory Visit (INDEPENDENT_AMBULATORY_CARE_PROVIDER_SITE_OTHER): Payer: Medicare Other | Admitting: Internal Medicine

## 2017-02-01 ENCOUNTER — Encounter: Payer: Self-pay | Admitting: Internal Medicine

## 2017-02-01 VITALS — BP 128/76 | HR 74 | Temp 98.3°F | Resp 16 | Ht 72.0 in | Wt 361.8 lb

## 2017-02-01 DIAGNOSIS — I69993 Ataxia following unspecified cerebrovascular disease: Secondary | ICD-10-CM | POA: Diagnosis not present

## 2017-02-01 DIAGNOSIS — I69998 Other sequelae following unspecified cerebrovascular disease: Secondary | ICD-10-CM

## 2017-02-01 DIAGNOSIS — I10 Essential (primary) hypertension: Secondary | ICD-10-CM | POA: Diagnosis not present

## 2017-02-01 DIAGNOSIS — G463 Brain stem stroke syndrome: Secondary | ICD-10-CM | POA: Diagnosis not present

## 2017-02-01 DIAGNOSIS — R208 Other disturbances of skin sensation: Secondary | ICD-10-CM | POA: Diagnosis not present

## 2017-02-01 DIAGNOSIS — G4733 Obstructive sleep apnea (adult) (pediatric): Secondary | ICD-10-CM

## 2017-02-01 NOTE — Progress Notes (Signed)
Subjective:  Patient ID: [name], male    DOB: [birth year]  Age: 71 y.o. MRN: [id number]  CC: Hypertension   HPI CARMELO [name] presents for a BP check - he complains of chronic, worsening DOE and has recently seen cardiology and pulmonary but no cause for the shortness of breath has been found.  He has mild lower extremity edema which has responded well to a loop diuretic.  He has chronic neurologic symptoms status post a CVA that include mild ataxia, weakness and numbness.  He offers no new neurological complaints.  Outpatient Medications Prior to Visit  Medication Sig Dispense Refill  . aspirin 81 MG tablet Take 81 mg by mouth 2 (two) times daily.     [name] atorvastatin (LIPITOR) 80 MG tablet TAKE 1 TABLET EVERY DAY 90 tablet 1  . CALCIUM PO Take 500 mg by mouth 3 (three) times daily.     . Cyanocobalamin (VITAMIN B-12) 1000 MCG SUBL Place 1,000 mcg under the tongue every evening.     [name] Sodium (DSS) 100 MG CAPS Take 100 mg by mouth every other day.    [name] ELIQUIS 5 MG TABS tablet TAKE 1 TABLET BY MOUTH TWICE DAILY 180 tablet 0  . finasteride (PROSCAR) 5 MG tablet Take 1 tablet (5 mg total) by mouth daily. 30 tablet 1  . flecainide (TAMBOCOR) 50 MG tablet Take 1 tablet (50 mg total) by mouth every 12 (twelve) hours. 180 tablet 2  . furosemide (LASIX) 40 MG tablet TAKE 1 TABLET BY MOUTH EVERY DAY 90 tablet 1  . KLOR-CON M20 20 MEQ tablet TAKE 2 TABLETS BY MOUTH EVERY DAY 180 tablet 0  . loratadine (CLARITIN) 10 MG tablet Take 10 mg by mouth daily.    [name] LORazepam (ATIVAN) 0.5 MG tablet Take 1 tablet (0.5 mg total) by mouth at bedtime. 30 tablet 2  . montelukast (SINGULAIR) 10 MG tablet TAKE 1 TABLET BY MOUTH EVERYDAY AT BEDTIME 30 tablet 5  . pantoprazole (PROTONIX) 40 MG tablet TAKE 1 TABLET BY MOUTH EVERY DAY 90 tablet 1  . rOPINIRole (REQUIP) 0.5 MG tablet TAKE 1 TABLET AT BEDTIME 90 tablet 1  . sertraline (ZOLOFT) 100 MG tablet Take 1.5 tablets (150 mg total) by mouth  daily. 45 tablet 2  . traMADol (ULTRAM) 50 MG tablet Take 1 tablet (50 mg total) by mouth every 8 (eight) hours as needed for severe pain. 20 tablet 0   No facility-administered medications prior to visit.     ROS Review of Systems  Constitutional: Negative for activity change, diaphoresis, fatigue and unexpected weight change.  HENT: Negative.  Negative for trouble swallowing.   Eyes: Negative.  Negative for visual disturbance.  Respiratory: Positive for shortness of breath. Negative for cough, chest tightness and wheezing.   Cardiovascular: Positive for leg swelling. Negative for chest pain and palpitations.  Gastrointestinal: Negative for abdominal pain, constipation, diarrhea, nausea and vomiting.  Endocrine: Negative.   Genitourinary: Negative.  Negative for difficulty urinating.  Musculoskeletal: Positive for gait problem.  Skin: Negative.  Negative for color change.  Neurological: Positive for weakness and numbness. Negative for dizziness, tremors and headaches.  Hematological: Negative.  Negative for adenopathy. Does not bruise/bleed easily.  Psychiatric/Behavioral: Negative.     Objective:  BP 128/76 (BP Location: Left Arm, Patient Position: Sitting, Cuff Size: Large)   Pulse 74   Temp 98.3 F (36.8 C) (Oral)   Resp 16   Ht 6' (1.829 m)   Wt (!) 361  lb 12 oz (164.1 kg)   SpO2 98%   BMI 49.06 kg/m   BP Readings from Last 3 Encounters:  02/01/17 128/76  01/31/17 (!) 150/70  01/26/17 (!) 150/82    Wt Readings from Last 3 Encounters:  02/01/17 (!) 361 lb 12 oz (164.1 kg)  01/26/17 (!) 360 lb 12.8 oz (163.7 kg)  12/27/16 (!) 358 lb 12.8 oz (162.8 kg)    Physical Exam  Constitutional: He is oriented to person, place, and time. No distress.  HENT:  Mouth/Throat: Oropharynx is clear and moist. No oropharyngeal exudate.  Eyes: Conjunctivae are normal. Right eye exhibits no discharge. Left eye exhibits no discharge. No scleral icterus.  Neck: Normal range of motion.  Neck supple. No JVD present. No thyromegaly present.  Cardiovascular: Normal rate, regular rhythm and intact distal pulses. Exam reveals no gallop and no friction rub.  No murmur heard. Pulmonary/Chest: Effort normal and breath sounds normal. No respiratory distress. He has no wheezes. He has no rales. He exhibits no tenderness.  Abdominal: Soft. Bowel sounds are normal. He exhibits no distension and no mass. There is no tenderness. There is no rebound and no guarding.  Musculoskeletal: He exhibits edema (trace LE edema). He exhibits no tenderness or deformity.  Lymphadenopathy:    He has no cervical adenopathy.  Neurological: He is alert and oriented to person, place, and time.  Skin: Skin is warm and dry. No rash noted. He is not diaphoretic. No erythema. No pallor.  Vitals reviewed.   Lab Results  Component Value Date   WBC 10.5 01/26/2017   HGB 14.8 01/26/2017   HCT 45.5 01/26/2017   PLT 225.0 01/26/2017   GLUCOSE 92 01/26/2017   CHOL 124 06/19/2016   TRIG 74 06/19/2016   HDL 32 (L) 06/19/2016   LDLDIRECT 168.0 10/20/2015   LDLCALC 77 06/19/2016   ALT 19 06/22/2016   AST 26 06/22/2016   NA 142 01/26/2017   K 3.7 01/26/2017   CL 103 01/26/2017   CREATININE 1.06 01/26/2017   BUN 19 01/26/2017   CO2 33 (H) 01/26/2017   TSH 2.10 10/24/2016   PSA 0.68 12/24/2015   INR 1.09 06/19/2016   HGBA1C 5.8 (H) 06/19/2016   MICROALBUR 1.1 12/12/2006    Dg Chest 2 View  Result Date: 12/28/2016 CLINICAL DATA:  Shortness of breath for several months EXAM: CHEST  2 VIEW COMPARISON:  06/19/2016 FINDINGS: Cardiac shadow is mildly enlarged but stable. The lungs are well aerated bilaterally without focal infiltrate. The apices are coned off of the superior aspect of film. No acute bony abnormality is seen. Degenerative changes of thoracic spine are noted. IMPRESSION: The apices are incompletely evaluated on this exam although the study is otherwise within normal limits. Electronically Signed    By: Inez Catalina M.D.   On: 12/28/2016 08:08    Assessment & Plan:   Izaya was seen today for hypertension.  Diagnoses and all orders for this visit:  Benign essential HTN- his blood pressure is well controlled on the loop diuretic - will continue at the current dose.  Recent cardiovascular and pulmonary workups were unremarkable.  The DOE is most likely related to obesity, recent CVA, and poor conditioning.  I encouraged him to stay active.   I am having Demir A. Lamontagne "[name]" maintain his Vitamin B-12, CALCIUM PO, DSS, aspirin, loratadine, finasteride, flecainide, furosemide, montelukast, sertraline, LORazepam, ELIQUIS, KLOR-CON M20, atorvastatin, traMADol, pantoprazole, and rOPINIRole.  No orders of the defined types were placed in this encounter.  Follow-up: Return in about 6 months (around 08/01/2017).  [name], MD

## 2017-02-01 NOTE — Patient Instructions (Signed)

## 2017-02-01 NOTE — Telephone Encounter (Signed)
Order placed. atc pt to make aware of order, no answer, vm full.  Wcb.

## 2017-02-01 NOTE — Telephone Encounter (Signed)
OK by me 

## 2017-02-01 NOTE — Telephone Encounter (Signed)
Barbaraann Rondo with [facility] called back, states that an order for auto cpap was placed but pt does not have a machine that supports autotitrating pressures.  Barbaraann Rondo is requesting that we place an order for a loaner cpap for Auto 10-20 X2 weeks to determine a set pressure for pt's machine.  BQ ok to order?  Thanks!

## 2017-02-01 NOTE — Progress Notes (Signed)
Reviewed, agree 

## 2017-02-01 NOTE — Telephone Encounter (Signed)
lmtcb X1 for [name] at [facility].  

## 2017-02-02 ENCOUNTER — Ambulatory Visit (INDEPENDENT_AMBULATORY_CARE_PROVIDER_SITE_OTHER)
Admission: RE | Admit: 2017-02-02 | Discharge: 2017-02-02 | Disposition: A | Discharge status: Home / Self Care | Payer: Medicare Other | Source: Ambulatory Visit | Attending: Pulmonary Disease | Admitting: Pulmonary Disease

## 2017-02-02 DIAGNOSIS — R0600 Dyspnea, unspecified: Secondary | ICD-10-CM | POA: Diagnosis not present

## 2017-02-02 DIAGNOSIS — R0602 Shortness of breath: Secondary | ICD-10-CM | POA: Diagnosis not present

## 2017-02-02 NOTE — Telephone Encounter (Signed)
Called and spoke with pt and he is aware of loaner machine and [facility] will get in contact with him to get this set up.

## 2017-02-04 NOTE — Assessment & Plan Note (Signed)
He agrees to work on his lifestyle modifications to lose weight. 

## 2017-02-05 ENCOUNTER — Encounter: Payer: Self-pay | Admitting: Pulmonary Disease

## 2017-02-05 ENCOUNTER — Ambulatory Visit (INDEPENDENT_AMBULATORY_CARE_PROVIDER_SITE_OTHER): Payer: Medicare Other | Admitting: Pulmonary Disease

## 2017-02-05 VITALS — BP 144/76 | HR 73 | Ht 72.0 in | Wt 363.0 lb

## 2017-02-05 DIAGNOSIS — G463 Brain stem stroke syndrome: Secondary | ICD-10-CM | POA: Diagnosis not present

## 2017-02-05 DIAGNOSIS — R0602 Shortness of breath: Secondary | ICD-10-CM | POA: Diagnosis not present

## 2017-02-05 DIAGNOSIS — G4733 Obstructive sleep apnea (adult) (pediatric): Secondary | ICD-10-CM | POA: Diagnosis not present

## 2017-02-05 DIAGNOSIS — I5030 Unspecified diastolic (congestive) heart failure: Secondary | ICD-10-CM | POA: Diagnosis not present

## 2017-02-05 DIAGNOSIS — J383 Other diseases of vocal cords: Secondary | ICD-10-CM | POA: Diagnosis not present

## 2017-02-05 MED ORDER — FUROSEMIDE 40 MG PO TABS: ORAL_TABLET | ORAL | 0 refills | Status: DC

## 2017-02-05 NOTE — Patient Instructions (Signed)
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

## 2017-02-05 NOTE — Progress Notes (Signed)
Subjective:    Patient ID: [name], male    DOB: 13-Oct-1945, 71 y.o.   MRN: [id number]  Synopsis: referred in 2018 for evaluation for cough.  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    HPI Chief Complaint  Patient presents with  . Follow-up    review pft and ct.  pt c/o worsening sob with any exertion.     Shakim says that he took the diuretic medicine when I gave it too him but his weight went up and he didn't urinate any more than normal.   He says that he was taking 40 mg daily of Lasix per my recommendation.  He continues to have shortness of breath.  He has not lost any weight, says that he is not tried to diet since he saw [place].  In general he says that he is gained about 70 pounds in the last 3 years.  Past Medical History:  Diagnosis Date  . Anemia 07/29/2013  . Asthma    childhood asthma  . Benign prostatic hypertrophy    several prostate biopsies neg for cancer.   . Bilateral hip pain   . Cancer ([place])   . CHF (congestive heart failure) ([place])   . Complication of anesthesia    MORPHINE CAUSES HALLUCINATIONS  . CVA (cerebral vascular accident) ([place]) 06/2016  . Depression   . FH: colonic polyps 2010  . Gout   . History of kidney stones   . Hypertension   . Kidney cysts   . Morbid obesity ([place])   . Numbness    feet / legs  . PAF (paroxysmal atrial fibrillation) with RVR 07/29/2013  . Sleep apnea    USES C PAP          Review of Systems  Constitutional: Negative for fever and unexpected weight change.  HENT: Positive for trouble swallowing. Negative for congestion, dental problem, ear pain, nosebleeds, postnasal drip, rhinorrhea, sinus pressure, sneezing and sore throat.   Eyes: Negative for redness and itching.  Respiratory: Positive for shortness of breath and wheezing. Negative for cough and chest tightness.   Cardiovascular: Positive for leg swelling. Negative for palpitations.    Gastrointestinal: Negative for nausea and vomiting.  Genitourinary: Negative for dysuria.  Musculoskeletal: Negative for joint swelling.  Skin: Negative for rash.  Allergic/Immunologic: Negative.  Negative for environmental allergies, food allergies and immunocompromised state.  Neurological: Negative for headaches.  Hematological: Bruises/bleeds easily.  Psychiatric/Behavioral: Positive for dysphoric mood. The patient is nervous/anxious.        Objective:   Physical Exam  Vitals:   02/05/17 1413  BP: (!) 144/76  Pulse: 73  SpO2: 98%  Weight: (!) 363 lb (164.7 kg)  Height: 6' (1.829 m)    Gen: obese, chronically ill appearing HENT: OP clear, TM's clear, neck supple PULM: CTA B, normal percussion CV: RRR, no mgr, trace edema GI: BS+, soft, nontender Derm: massive leg edema bilaterally, no cyanosis or rash Psyche: normal mood and affect    CBC    Component Value Date/Time   WBC 10.5 01/26/2017 1638   RBC 5.13 01/26/2017 1638   HGB 14.8 01/26/2017 1638   HCT 45.5 01/26/2017 1638   PLT 225.0 01/26/2017 1638   MCV 88.8 01/26/2017 1638   MCH 28.9 07/10/2016 0621   MCHC 32.5 01/26/2017 1638   RDW 14.5 01/26/2017 1638   LYMPHSABS 1.7 01/26/2017 1638   MONOABS 1.0 01/26/2017 1638   EOSABS 0.2 01/26/2017  1638   BASOSABS 0.1 01/26/2017 1638   BMET    Component Value Date/Time   NA 142 01/26/2017 1638   K 3.7 01/26/2017 1638   CL 103 01/26/2017 1638   CO2 33 (H) 01/26/2017 1638   GLUCOSE 92 01/26/2017 1638   BUN 19 01/26/2017 1638   CREATININE 1.06 01/26/2017 1638   CREATININE 1.12 12/05/2012 1406   CALCIUM 8.9 01/26/2017 1638   GFRNONAA >60 07/12/2016 0557   GFRAA >60 07/12/2016 0557   Pulmonary function test: October 2018 ratio normal, FEV1 3.0 L 86% predicted, forced vital capacity 3.94 L 83% predicted, total lung capacity 7.1 396% predicted, DLCO 27.4 78% predicted  Chest imaging: September 2018 chest x-ray: Incomplete view of the lungs, I question  bronchitis changes in the bases 01/2017 HRCT > images independently reviewed showing normal pulmonary parenchyma bilaterally 2015 CT chest images independently reviewed showing a moderate size pleural effusion on the left, no pulmonary parenchymal abnormality otherwise.  Echocardiogram: March 2018 LV EF 60-65%, mild LVH, aortic valve normal, mitral valve normal, mild dilation of the left atrium, normal RV size and function  Nuclear stress test: February 2018 low risk study  Records from his visit with our nurse practitioner last week reviewed, she adjusted his CPAP settings to an auto titrating 10-20 cm of water range, follow-up with throat at [facility], have a CT chest last week     Assessment & Plan:   Diastolic congestive heart failure, unspecified HF chronicity ([place]) - Plan: Basic Metabolic Panel (BMET)  OSA (obstructive sleep apnea)  Vocal cord dysfunction  Morbid obesity due to excess calories ([facility])  SOB (shortness of breath)  Plan: Lip has not changed at all since the last visit.  His weight has not changed, he has not made any effort to lose weight, and he says that the diuretic medicines did not help.  I think we need to increase the dose of Lasix to 80 mg daily because he clearly has not met a diuresing threshold with that drug.  I believe he is tolerant to furosemide.  If this is ineffective then we can change him to torsemide.  However, I think that he would feel much better with about at least 8-10 pounds of fluid removed.  That all being said, I see no evidence of underlying lung that his shortness of breath is due primarily to obesity and deconditioning.  I talked to him today about eating healthy and trying to change his diet.  I think that is going to take quite a bit for him to make a conscious effort to lose weight through diet and exercise.  Plan: Leg swelling with history of diastolic heart failure: Take 80 mg of furosemide daily for the next 5 days Follow-up  with our nurse practitioner next week and report back with regards to weight loss change in leg swelling, expectation is that we will adjust the dose of diuretics as necessary on that visit We will check a basic metabolic panel follow-up your kidney function and potassium level  Shortness of breath: At this time I see no evidence of a lung disease I think you should exercise regularly and lose weight  Pharyngeal paresis: I think that you should follow-up with ear nose and throat regarding potential corrective surgery  Obstructive sleep apnea: Continue CPAP 10-20 cm of water auto adjust via the machine  Follow-up in 1 week   Current Outpatient Medications:  .  aspirin 81 MG tablet, Take 81 mg by mouth 2 (  two) times daily. , Disp: , Rfl:  .  atorvastatin (LIPITOR) 80 MG tablet, TAKE 1 TABLET EVERY DAY, Disp: 90 tablet, Rfl: 1 .  CALCIUM PO, Take 500 mg by mouth 3 (three) times daily. , Disp: , Rfl:  .  Cyanocobalamin (VITAMIN B-12) 1000 MCG SUBL, Place 1,000 mcg under the tongue every evening. , Disp: , Rfl:  .  Docusate Sodium (DSS) 100 MG CAPS, Take 100 mg by mouth every other day., Disp: , Rfl:  .  ELIQUIS 5 MG TABS tablet, TAKE 1 TABLET BY MOUTH TWICE DAILY, Disp: 180 tablet, Rfl: 0 .  finasteride (PROSCAR) 5 MG tablet, Take 1 tablet (5 mg total) by mouth daily., Disp: 30 tablet, Rfl: 1 .  flecainide (TAMBOCOR) 50 MG tablet, Take 1 tablet (50 mg total) by mouth every 12 (twelve) hours., Disp: 180 tablet, Rfl: 2 .  furosemide (LASIX) 40 MG tablet, TAKE 1 TABLET BY MOUTH EVERY DAY, Disp: 90 tablet, Rfl: 1 .  KLOR-CON M20 20 MEQ tablet, TAKE 2 TABLETS BY MOUTH EVERY DAY, Disp: 180 tablet, Rfl: 0 .  loratadine (CLARITIN) 10 MG tablet, Take 10 mg by mouth daily., Disp: , Rfl:  .  LORazepam (ATIVAN) 0.5 MG tablet, Take 1 tablet (0.5 mg total) by mouth at bedtime., Disp: 30 tablet, Rfl: 2 .  montelukast (SINGULAIR) 10 MG tablet, TAKE 1 TABLET BY MOUTH EVERYDAY AT BEDTIME, Disp: 30 tablet,  Rfl: 5 .  pantoprazole (PROTONIX) 40 MG tablet, TAKE 1 TABLET BY MOUTH EVERY DAY, Disp: 90 tablet, Rfl: 1 .  rOPINIRole (REQUIP) 0.5 MG tablet, TAKE 1 TABLET AT BEDTIME, Disp: 90 tablet, Rfl: 1 .  sertraline (ZOLOFT) 100 MG tablet, Take 1.5 tablets (150 mg total) by mouth daily., Disp: 45 tablet, Rfl: 2 .  traMADol (ULTRAM) 50 MG tablet, Take 1 tablet (50 mg total) by mouth every 8 (eight) hours as needed for severe pain., Disp: 20 tablet, Rfl: 0

## 2017-02-06 ENCOUNTER — Other Ambulatory Visit (HOSPITAL_COMMUNITY): Payer: Self-pay | Admitting: Psychiatry

## 2017-02-06 DIAGNOSIS — F330 Major depressive disorder, recurrent, mild: Secondary | ICD-10-CM

## 2017-02-09 ENCOUNTER — Telehealth (HOSPITAL_COMMUNITY): Payer: Self-pay

## 2017-02-09 ENCOUNTER — Other Ambulatory Visit (HOSPITAL_COMMUNITY): Payer: Self-pay

## 2017-02-09 DIAGNOSIS — F330 Major depressive disorder, recurrent, mild: Secondary | ICD-10-CM

## 2017-02-09 MED ORDER — SERTRALINE HCL 100 MG PO TABS: 150.0000 mg | ORAL_TABLET | Freq: Every day | ORAL | 2 refills | Status: DC

## 2017-02-14 ENCOUNTER — Ambulatory Visit (INDEPENDENT_AMBULATORY_CARE_PROVIDER_SITE_OTHER): Payer: Medicare Other | Admitting: Acute Care

## 2017-02-14 ENCOUNTER — Telehealth: Payer: Self-pay | Admitting: Acute Care

## 2017-02-14 ENCOUNTER — Other Ambulatory Visit (INDEPENDENT_AMBULATORY_CARE_PROVIDER_SITE_OTHER): Payer: Medicare Other

## 2017-02-14 ENCOUNTER — Encounter: Payer: Self-pay | Admitting: Acute Care

## 2017-02-14 DIAGNOSIS — I5030 Unspecified diastolic (congestive) heart failure: Secondary | ICD-10-CM

## 2017-02-14 DIAGNOSIS — G463 Brain stem stroke syndrome: Secondary | ICD-10-CM

## 2017-02-14 DIAGNOSIS — R0602 Shortness of breath: Secondary | ICD-10-CM | POA: Diagnosis not present

## 2017-02-14 DIAGNOSIS — G4733 Obstructive sleep apnea (adult) (pediatric): Secondary | ICD-10-CM | POA: Diagnosis not present

## 2017-02-14 LAB — BASIC METABOLIC PANEL
BUN: 22 mg/dL (ref 6–23)
CALCIUM: 9 mg/dL (ref 8.4–10.5)
CO2: 33 mEq/L — ABNORMAL HIGH (ref 19–32)
Chloride: 102 mEq/L (ref 96–112)
Creatinine, Ser: 1.05 mg/dL (ref 0.40–1.50)
GFR: 73.97 mL/min (ref >60.00)
GLUCOSE: 102 mg/dL — AB (ref 70–99)
POTASSIUM: 3.9 meq/L (ref 3.5–5.1)
SODIUM: 142 meq/L (ref 135–145)

## 2017-02-14 MED ORDER — TORSEMIDE 20 MG PO TABS: 20.0000 mg | ORAL_TABLET | Freq: Every day | ORAL | 3 refills | Status: DC

## 2017-02-14 NOTE — Assessment & Plan Note (Signed)
Continue wearing CPAP nightly Plan: We will work on getting your CPAP information available to you to monitor daily. Continue on CPAP at bedtime. You appear to be benefiting from the treatment Goal is to wear for at least 6 hours each night for maximal clinical benefit. Continue to work on weight loss, as the link between excess weight  and sleep apnea is well established.  Do not drive if sleepy. Remember to clean mask, tubing, filter, and reservoir once weekly with soapy water.  Please diet and work on weight loss.

## 2017-02-14 NOTE — Patient Instructions (Addendum)
It is nice to see you today. We will work on getting your CPAP information available to you to monitor daily. Continue on CPAP at bedtime. You appear to be benefiting from the treatment Goal is to wear for at least 6 hours each night for maximal clinical benefit. Continue to work on weight loss, as the link between excess weight  and sleep apnea is well established.  Do not drive if sleepy. Remember to clean mask, tubing, filter, and reservoir once weekly with soapy water.  Please diet and work on weight loss.  Physical deconditioning is impacting your shortness of breath. Stop Lasix Start Torsemide 20 mg daily BMET today We will call you with results. Follow up with cardiology for dosing of your diuretic.( Dr. Debara Pickett) within the month. Follow up with ENT doctor in [place] as you are scheduling now. Follow up with Dr. Lake Bells in 3 months or sooner if needed. Consider pulmonary rehab Please contact office for sooner follow up if symptoms do not improve or worsen or seek emergency care

## 2017-02-14 NOTE — Telephone Encounter (Signed)
Please get [name] seen at his cardiologists office within the next 2 weeks. He is seen by Dr. Debara Pickett. A PA or NP appointment will be fine as we would like them to assess his diuretic response to his new diuretic and continue to manage. Thanks so much.

## 2017-02-14 NOTE — Progress Notes (Signed)
History of Present Illness [name] is a 71 y.o. male former smoker ( Quit [phone]) with chronic diastolic heart failure, OSA on CPAP, Morbid obesity, and dyspnea. He is followed by Dr. Lake Bells.  Synopsis: referred in 2018 for evaluation for cough.  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     02/14/2017 1 week follow up Pt was seen 02/05/2017 by Dr. Lake Bells for continued weight gain and dyspnea. Per Dr.McQuaids note, he felt the patient  has dyspnea due to his obesity and deconditioning.Plan at the time of that visit was as follows:  Plan: Leg swelling with history of diastolic heart failure: Take 80 mg of furosemide daily for the next 5 days Follow-up with our nurse practitioner next week and report back with regards to weight loss change in leg swelling, expectation is that we will adjust the dose of diuretics as necessary on that visit We will check a basic metabolic panel follow-up your kidney function and potassium level  Shortness of breath: At this time I see no evidence of a lung disease I think you should exercise regularly and lose weight  Pharyngeal paresis: I think that you should follow-up with ear nose and throat regarding potential corrective surgery  Obstructive sleep apnea: Continue CPAP 10-20 cm of water auto adjust via the machine   At the time of the visit Dr. Lake Bells  felt the patient needed an increase in his diuretic. He increased his diuretic to 80 mg x 5 days with return visit today to evaluate effectiveness. The patient states he is still short of breath and complains of fluid retention.The  patient states his weight today is up a pound per our scale despite doubling up on lasix x 5 days. The patient states he is not currently weighing himself daily, so is basing this off the weights at his last visit and today. . I have reminded to weigh himself daily. He states he is urinating more with the  increase in his lasix, but his weight is not reflective of this. He states he has not been dieting. States that before Thanksgiving is not the time to diet. He is very deconditioned due to his body habitus.He denies any attempts at increasing his activity.He denies any dietary salt and states he is following a heart smart diet.I have encouraged him to use his exercise bike and work on dieting. He declined cardio-pulmonary rehab.He denies any sputum, fever, cough, chest pain, orthopnea or hemoptysis.   Weight 02/05/2017>> 363 lbs Weight 02/14/2017>> after 5 days of Lasix 80 mg>>364 lbs 12 ounces  Test Results: Results for [name], [name] (MRN [id number]) as of 02/14/2017 19:46  Ref. Range 02/14/2017 15:33  Sodium Latest Ref Range: 135 - 145 mEq/L 142  Potassium Latest Ref Range: 3.5 - 5.1 mEq/L 3.9  Chloride Latest Ref Range: 96 - 112 mEq/L 102  CO2 Latest Ref Range: 19 - 32 mEq/L 33 (H)  Glucose Latest Ref Range: 70 - 99 mg/dL 102 (H)  BUN Latest Ref Range: 6 - 23 mg/dL 22  Creatinine Latest Ref Range: 0.40 - 1.50 mg/dL 1.05  Calcium Latest Ref Range: 8.4 - 10.5 mg/dL 9.0  GFR Latest Ref Range: >60.00 mL/min 73.97       Pulmonary function test: October 2018 ratio normal, FEV1 3.0 L 86% predicted, forced vital capacity 3.94 L 83% predicted, total lung capacity 7.1 396% predicted, DLCO 27.4 78% predicted Minimal airway obstruction  suggesting small airway disease.  Insignificant response to bronchodilator  Chest imaging: September 2018 chest x-ray: Incomplete view of the lungs, I question bronchitis changes in the bases 02/2017 HRCT > images independently reviewed showing normal pulmonary parenchyma bilaterally. Stable. No new or acute interval findings. No features to explain the patient's history of cough with dyspnea. 2015 CT chest images independently reviewed showing a moderate size pleural effusion on the left, no pulmonary parenchymal abnormality  otherwise.  Echocardiogram: March 2018 LV EF 60-65%, mild LVH, aortic valve normal, mitral valve normal, mild dilation of the left atrium, normal RV size and function  Nuclear stress test: February 2018 low risk study  Records from his visit with our nurse practitioner last week reviewed, she adjusted his CPAP settings to an auto titrating 10-20 cm of water range, follow-up with throat at [facility], have a CT chest last week    CBC Latest Ref Rng & Units 01/26/2017 07/10/2016 06/22/2016  WBC 4.0 - 10.5 K/uL 10.5 8.7 12.7(H)  Hemoglobin 13.0 - 17.0 g/dL 14.8 13.2 15.2  Hematocrit 39.0 - 52.0 % 45.5 41.0 46.7  Platelets 150.0 - 400.0 K/uL 225.0 196 183    BMP Latest Ref Rng & Units 02/14/2017 01/26/2017 10/24/2016  Glucose 70 - 99 mg/dL 102(H) 92 107(H)  BUN 6 - 23 mg/dL 22 19 18   Creatinine 0.40 - 1.50 mg/dL 1.05 1.06 1.09  Sodium 135 - 145 mEq/L 142 142 142  Potassium 3.5 - 5.1 mEq/L 3.9 3.7 3.6  Chloride 96 - 112 mEq/L 102 103 105  CO2 19 - 32 mEq/L 33(H) 33(H) 31  Calcium 8.4 - 10.5 mg/dL 9.0 8.9 8.7    BNP    Component Value Date/Time   BNP 88.3 06/14/2015 0345    ProBNP    Component Value Date/Time   PROBNP 185.0 (H) 01/26/2017 1638    PFT    Component Value Date/Time   FEV1PRE 2.96 01/03/2017 1403   FEV1POST 3.00 01/03/2017 1403   FVCPRE 3.98 01/03/2017 1403   FVCPOST 3.94 01/03/2017 1403   TLC 7.13 01/03/2017 1403   DLCOUNC 27.43 01/03/2017 1403   PREFEV1FVCRT 74 01/03/2017 1403   PSTFEV1FVCRT 76 01/03/2017 1403    Ct Chest Wo Contrast  Result Date: 02/02/2017 CLINICAL DATA:  Cough with dyspnea. EXAM: CT CHEST WITHOUT CONTRAST TECHNIQUE: Multidetector CT imaging of the chest was performed following the standard protocol without IV contrast. COMPARISON:  06/13/2015 FINDINGS: Cardiovascular: The heart size is normal. No pericardial effusion. Coronary artery calcification is evident. Atherosclerotic calcification is noted in the wall of the thoracic  aorta. Aberrant origin right subclavian artery again noted. Mediastinum/Nodes: Stable 2.1 cm right thyroid nodule. No mediastinal lymphadenopathy. No evidence for gross hilar lymphadenopathy although assessment is limited by the lack of intravenous contrast on today's study. The esophagus has normal imaging features. There is no axillary lymphadenopathy. Lungs/Pleura: No focal airspace consolidation. No pulmonary edema or pleural effusion. No suspicious pulmonary nodule or mass. Upper Abdomen: Lap band noted in situ. No acute findings within the visualized upper abdomen. Musculoskeletal: Bone windows reveal no worrisome lytic or sclerotic osseous lesions. IMPRESSION: Stable. No new or acute interval findings. No features to explain the patient's history of cough with dyspnea. Electronically Signed   By: Misty Stanley M.D.   On: 02/02/2017 17:01     Past medical hx Past Medical History:  Diagnosis Date  . Anemia 07/29/2013  . Asthma    childhood asthma  . Benign prostatic hypertrophy    several prostate biopsies neg for cancer.   [name]  Bilateral hip pain   . Cancer ([place])   . CHF (congestive heart failure) ([place])   . Complication of anesthesia    MORPHINE CAUSES HALLUCINATIONS  . CVA (cerebral vascular accident) ([place]) 06/2016  . Depression   . FH: colonic polyps 2010  . Gout   . History of kidney stones   . Hypertension   . Kidney cysts   . Morbid obesity ([place])   . Numbness    feet / legs  . PAF (paroxysmal atrial fibrillation) with RVR 07/29/2013  . Sleep apnea    USES C PAP     Social History   Tobacco Use  . Smoking status: Former Smoker    Packs/day: 0.20    Years: 13.00    Pack years: 2.60    Types: Cigarettes    Start date: 04/03/1961    Last attempt to quit: 08/30/1974    Years since quitting: 42.4  . Smokeless tobacco: Never Used  Substance Use Topics  . Alcohol use: No    Alcohol/week: 0.0 oz  . Drug use: No    Mr.Borror reports that he quit smoking about 42 years ago.  His smoking use included cigarettes. He started smoking about 55 years ago. He has a 2.60 pack-year smoking history. he has never used smokeless tobacco. He reports that he does not drink alcohol or use drugs.  Tobacco Cessation: Former smoker quit 1976  Past surgical hx, Family hx, Social hx all reviewed.  Current Outpatient Medications on File Prior to Visit  Medication Sig  . aspirin 81 MG tablet Take 81 mg by mouth 2 (two) times daily.   [name] atorvastatin (LIPITOR) 80 MG tablet TAKE 1 TABLET EVERY DAY  . CALCIUM PO Take 500 mg by mouth 3 (three) times daily.   . Cyanocobalamin (VITAMIN B-12) 1000 MCG SUBL Place 1,000 mcg under the tongue every evening.   [name] Sodium (DSS) 100 MG CAPS Take 100 mg by mouth every other day.  [name] ELIQUIS 5 MG TABS tablet TAKE 1 TABLET BY MOUTH TWICE DAILY  . finasteride (PROSCAR) 5 MG tablet Take 1 tablet (5 mg total) by mouth daily.  . flecainide (TAMBOCOR) 50 MG tablet Take 1 tablet (50 mg total) by mouth every 12 (twelve) hours.  [name] KLOR-CON M20 20 MEQ tablet TAKE 2 TABLETS BY MOUTH EVERY DAY  . loratadine (CLARITIN) 10 MG tablet Take 10 mg by mouth daily.  [name] LORazepam (ATIVAN) 0.5 MG tablet Take 1 tablet (0.5 mg total) by mouth at bedtime.  . montelukast (SINGULAIR) 10 MG tablet TAKE 1 TABLET BY MOUTH EVERYDAY AT BEDTIME  . pantoprazole (PROTONIX) 40 MG tablet TAKE 1 TABLET BY MOUTH EVERY DAY  . rOPINIRole (REQUIP) 0.5 MG tablet TAKE 1 TABLET AT BEDTIME  . sertraline (ZOLOFT) 100 MG tablet Take 1.5 tablets (150 mg total) daily by mouth.  . traMADol (ULTRAM) 50 MG tablet Take 1 tablet (50 mg total) by mouth every 8 (eight) hours as needed for severe pain.  . [DISCONTINUED] furosemide (LASIX) 40 MG tablet Increase to 80mg  daily X5 days, then resume normal dose of 40mg  daily.   No current facility-administered medications on file prior to visit.      Allergies  Allergen Reactions  . Morphine Other (See Comments)    Nausea and Severe hallucinations   . Codeine Nausea Only  . Penicillins Other (See Comments)    LOC with shot  . Propoxyphene N-Acetaminophen Nausea Only    Review Of Systems:  Constitutional:  No  weight loss, night sweats,  Fevers, chills, + fatigue, or  lassitude.  HEENT:   No headaches,  Difficulty swallowing,  Tooth/dental problems, or  Sore throat,                No sneezing, itching, ear ache, nasal congestion, post nasal drip,   CV:  No chest pain,  Orthopnea, PND, + swelling in lower extremities, no anasarca, dizziness, palpitations, syncope.   GI  No heartburn, indigestion, abdominal pain, nausea, vomiting, diarrhea, change in bowel habits, loss of appetite, bloody stools.   Resp: + shortness of breath with exertion less at rest.  No excess mucus, no productive cough,  No non-productive cough,  No coughing up of blood.  No change in color of mucus.  No wheezing.  No chest wall deformity  Skin: no rash or lesions.  GU: no dysuria, change in color of urine, no urgency or frequency.  No flank pain, no hematuria   MS:  No joint pain or swelling.  No decreased range of motion.  No back pain.  Psych:  No change in mood or affect. No depression or anxiety.  No memory loss.   Vital Signs BP 130/84 (BP Location: Left Arm, Cuff Size: Normal)   Pulse 65   Ht 6' (1.829 m)   Wt (!) 364 lb 12.8 oz (165.5 kg)   SpO2 98%   BMI 49.48 kg/m    Physical Exam:  General- No distress,  A&Ox3, chronically ill appearing male walks with cane ENT: No sinus tenderness, TM clear, pale nasal mucosa, no oral exudate,no post nasal drip, no LAN Cardiac: S1, S2, regular rate and rhythm, no murmur Chest: No wheeze/ rales/ dullness; no accessory muscle use, no nasal flaring, no sternal retractions Abd.: Soft Non-tender, non-distended, obese Ext: No clubbing cyanosis, edema Neuro:  Deconditioned at baseline, MAE x 4, A&Ox 3, Cranial nerves intact Skin: No rashes, warm and dry Psych: normal mood and  behavior   Assessment/Plan  OSA (obstructive sleep apnea) Continue wearing CPAP nightly Plan: We will work on getting your CPAP information available to you to monitor daily. Continue on CPAP at bedtime. You appear to be benefiting from the treatment Goal is to wear for at least 6 hours each night for maximal clinical benefit. Continue to work on weight loss, as the link between excess weight  and sleep apnea is well established.  Do not drive if sleepy. Remember to clean mask, tubing, filter, and reservoir once weekly with soapy water.  Please diet and work on weight loss.    Shortness of breath I discussed this case with Dr.McQuaid. He felt the patient may better respond to Torsemide, and suggested we do a trial as he has been non-responsive to Lasix. We will start a therapeutic trial once we have checked a BMET  and have the patient follow up with Dr. Debara Pickett his cardiologist within the next few weeks, as they are appropriately managing his diuretic. There does not appear to be a pulmonary reason for his dyspnea, most likely cause is deconditioning and obesity. PFT's indicate Minimal airway obstruction  suggesting small airway disease. Insignificant response to bronchodilator  Plan:  Physical deconditioning is impacting your shortness of breath. Stop Lasix Start Torsemide 20 mg daily BMET today We will call you with results. Follow up with cardiology for dosing of your diuretic.( Dr. Debara Pickett) within the month. Follow up with Dr. Lake Bells in 3 months or sooner if needed. Consider pulmonary rehab Please contact office for  sooner follow up if symptoms do not improve or worsen or seek emergency care   Pharyngeal paresis: Follow up with ENT doctor in [place] as you are scheduling to do    Magdalen Spatz, NP 02/14/2017  8:08 PM

## 2017-02-14 NOTE — Assessment & Plan Note (Addendum)
I discussed this case with Dr.McQuaid. He felt the patient may better respond to Torsemide, and suggested we do a trial as he has been non-responsive to Lasix. We will start a therapeutic trial once we have checked a BMET  and have the patient follow up with Dr. Debara Pickett his cardiologist within the next few weeks, as they are appropriately managing his diuretic. There does not appear to be a pulmonary reason for his dyspnea, most likely cause is deconditioning and obesity. PFT's indicate Minimal airway obstruction  suggesting small airway disease. Insignificant response to bronchodilator  Plan:  Physical deconditioning is impacting your shortness of breath. Stop Lasix Start Torsemide 20 mg daily BMET today We will call you with results. Follow up with cardiology for dosing of your diuretic.( Dr. Debara Pickett) within the month. Follow up with Dr. Lake Bells in 3 months or sooner if needed. Consider pulmonary rehab Please contact office for sooner follow up if symptoms do not improve or worsen or seek emergency care

## 2017-02-15 ENCOUNTER — Telehealth: Payer: Self-pay | Admitting: Acute Care

## 2017-02-15 NOTE — Telephone Encounter (Signed)
Called and spoke with patient today regarding message from [place] requesting patient be seen by his cardologist Dr. Debara Pickett and not a PA or NP to discuss patient's new diuretic and the response that pt is having with this diuretic, and per SG she would like Dr. Debara Pickett to manage patient regarding his symptoms with diuretic.  Patient has verbalized understanding of recommendations and denied any questions or concerns at this time. Nothing further needed at this time.

## 2017-02-19 ENCOUNTER — Ambulatory Visit (HOSPITAL_BASED_OUTPATIENT_CLINIC_OR_DEPARTMENT_OTHER): Payer: Medicare Other | Attending: Acute Care | Admitting: Radiology

## 2017-02-19 DIAGNOSIS — G4733 Obstructive sleep apnea (adult) (pediatric): Secondary | ICD-10-CM

## 2017-02-20 ENCOUNTER — Encounter: Payer: Self-pay | Admitting: Internal Medicine

## 2017-02-20 ENCOUNTER — Ambulatory Visit (INDEPENDENT_AMBULATORY_CARE_PROVIDER_SITE_OTHER): Payer: Medicare Other | Admitting: Internal Medicine

## 2017-02-20 VITALS — BP 142/80 | HR 63 | Ht 72.0 in | Wt 357.0 lb

## 2017-02-20 DIAGNOSIS — Z79899 Other long term (current) drug therapy: Secondary | ICD-10-CM

## 2017-02-20 DIAGNOSIS — J383 Other diseases of vocal cords: Secondary | ICD-10-CM

## 2017-02-20 DIAGNOSIS — R0602 Shortness of breath: Secondary | ICD-10-CM | POA: Diagnosis not present

## 2017-02-20 DIAGNOSIS — G463 Brain stem stroke syndrome: Secondary | ICD-10-CM

## 2017-02-20 DIAGNOSIS — I480 Paroxysmal atrial fibrillation: Secondary | ICD-10-CM | POA: Diagnosis not present

## 2017-02-20 LAB — BASIC METABOLIC PANEL
BUN / CREAT RATIO: 17 (ref 10–24)
BUN: 20 mg/dL (ref 8–27)
CO2: 29 mmol/L (ref 20–29)
CREATININE: 1.19 mg/dL (ref 0.76–1.27)
Calcium: 8.7 mg/dL (ref 8.6–10.2)
Chloride: 100 mmol/L (ref 96–106)
GFR calc non Af Amer: 61 mL/min/{1.73_m2} (ref >59)
GFR, EST AFRICAN AMERICAN: 71 mL/min/{1.73_m2} (ref >59)
GLUCOSE: 100 mg/dL — AB (ref 65–99)
POTASSIUM: 3.5 mmol/L (ref 3.5–5.2)
Sodium: 143 mmol/L (ref 134–144)

## 2017-02-20 LAB — PRO B NATRIURETIC PEPTIDE: NT-Pro BNP: 107 pg/mL (ref 0–376)

## 2017-02-20 NOTE — Patient Instructions (Signed)
Your physician recommends that you return for lab work TODAY - BMET, BNP  Your physician wants you to follow-up in: 6 months with Dr. Debara Pickett. You will receive a reminder letter in the mail two months in advance. If you don't receive a letter, please call our office to schedule the follow-up appointment.

## 2017-02-20 NOTE — Progress Notes (Signed)
OFFICE NOTE  Chief Complaint:  Follow-up dyspnea  Primary Care Physician: Janith Lima, MD  HPI:  [name] is a 71 y/o male with a history of bariatric surgery, one year ago, lost about 85 pounds before and after surgery. Also has a history of hypertension and sleep apnea. He has been admitted twice in the last month with an unusual presentation with multiple medical problems, including pulmonary embolus, moderate to large pericardial effusion, new onset a-fib and GI bleed. His PE was diagnosed by his PCP through outpatient w/u and he was placed on Xarelto. His first hospital admission was from 4/15-4/24. At that time his CC was chest pain. W/u suggested a diagnosis of pericarditis. An Echocardiogram was done, which showed moderate to large pericardial effusion. Meanwhile the night of 4/16 he went in to afib with RVR, probably secondary to PE and was transferred to step down and started on cardizem drip. He converted to sinus the same night , cardizem discontinued and he was resumed on metoprolol. CT surgery was consulted for pericardial window. He underwent pericardial window on 4/17 and hemorrhagic fluid was drained and was sent for analysis. Gram stain and cultures were negative. He was also started on colchicine for his pericarditis. The evening of 4/20 pt went back in to afib with RVR and had bloody bowel movement. His xarelto was discontinued and he was put on cardizem gtt. Over the next 24 hours his hemoglobin dropped to 7.8 and he received 2 units of prbc transfusion. His repeat H&H improved. Dr Henrene Pastor was consulted from GI on 4/20 and he underwent endoscopy on 4/21 . He was found to have superficial ulcers in the stomach, underwent biopsy of the area. He was recommended to continue on PPI twice daily for 2 weeks and resume xarelto. He was discharged home then returned 3 days later on 07/28/13 with a complaint of increased fatigue and SOB. Repeat 2D echo demonstrated only minimal  residual effusion with normal RV and LV function. 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 He was admitted and underwent a repeat EGD which showed healing of ulcers, no bleed. He was continued on PO iron and a PPI. His Hgb at time of discharge was stable at 9.0. He was discharged home. Cardiology had recommended an OP stress test. This was done 08/03/13 and showed normal myocardial perfusion and normal LV EF of 58%.  He returns today and has reported a marked improvement in his shortness of breath. He is able to do most activities without any difficulty. He continues on Eliquis for his pulmonary embolus and for stroke prevention. He is on flecainide for antiarrhythmic therapy. He denies any chest pain. His only complaint is fatigue. He reports some fatigue and notices it worse as he has been working on refurbishing a house that his mother used to own to sell. He reports his sleep is good at night however he does have difficulty falling asleep which may be playing a role in his fatigue.  Mr. Harts returns today for follow-up. He reports no significant difference in his symptoms. He gets some mild shortness of breath with exercise which she does very infrequently. He also reports leg swelling which is persistent. Weight has been fairly stable although could stand to go down. We talked about ways he could work on weight loss. He does have a history of lap band in the past however he is not been able to lose anymore weight and in fact  has gained it back. He denies any recurrent palpitation or A. Fib that he is aware of. He continues on flecainide which he is tolerating without any difficulty.  04/28/2016  Mr. Pro was seen back today in follow-up. He continues to complain of shortness of breath, persistent with exertion. Weight is now up from 337-350 pounds. He is celebrating the birth of this newest grandchild and wants to be more active and is frustrated that he is not able to do much activity  without shortness of breath. He is on flecainide without any recurrent atrial fibrillation. His last stress test was more than 2 years ago and he is due for repeat stress test.  05/23/2016  Mr. Huntsman returns today for follow-up. He is unfortunately gained another few pounds although he felt that this is related to stuff in his pockets. His stress test was negative for ischemia. It is noted that his blood pressure is high again today 168/89. He was previously elevated over 150. He does have a history of hypertension and was on Lotrel in the past but was taken off of that due to hypotension after significant weight loss that occurred after lap band surgery. Unfortunately he has gained back most of that weight and likely is hypertensive secondary to that.  11/22/2016  Mr. Hopfer was seen today in follow-up. He continues to have some progressive shortness of breath with exertion. 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 I wonder if this is playing a role in upper airway obstruction which could be causing his shortness of breath. Blood pressure appears to be better controlled today although he was taken off of blood pressure medications when he had a stroke. He was also previously on a number of inhalers including Symbicort and Breo Ellipta - these have also been discontinued.  02/20/2017  Mr. Noy returns today for follow-up.  I referred him to pulmonary for evaluation of his dyspnea.  He had no improvement with inhalers and underwent repeat pulmonary function testing by Dr. Lake Bells, which failed to reveal any significant obstructive pulmonary disease.  It was felt that he might be volume overloaded and he was switched from Lasix to torsemide.  It appears the dose was equivalent.  He is not diuresed anymore and says that he is not more short of breath however we will need to recheck his metabolic profile to ensure  he is not being over diuresed.  With regards to vocal cord dysfunction, he has follow-up at [facility] for evaluation of possible surgery.  It is not clear whether this will improve his shortness of breath not and I advised him to ask his surgeon whether the surgery would help more with his breathing or phonation.  PMHx:  Past Medical History:  Diagnosis Date  . Anemia 07/29/2013  . Asthma    childhood asthma  . Benign prostatic hypertrophy    several prostate biopsies neg for cancer.   . Bilateral hip pain   . Cancer ([place])   . CHF (congestive heart failure) ([place])   . Complication of anesthesia    MORPHINE CAUSES HALLUCINATIONS  . CVA (cerebral vascular accident) ([place]) 06/2016  . Depression   . FH: colonic polyps 2010  . Gout   . History of kidney stones   . Hypertension   . Kidney cysts   . Morbid obesity ([place])   . Numbness    feet / legs  . PAF (  paroxysmal atrial fibrillation) with RVR 07/29/2013  . Sleep apnea    USES C PAP    Past Surgical History:  Procedure Laterality Date  . ARTHROSCOPY KNEE W/ DRILLING    . BREATH TEK H PYLORI N/A 12/23/2012   Procedure: BREATH TEK H PYLORI;  Surgeon: Gayland Curry, MD;  Location: [facility];  Service: General;  Laterality: N/A;  . CHOLECYSTECTOMY  1989  . COLONOSCOPY N/A 07/31/2013   Procedure: COLONOSCOPY;  Surgeon: Gatha Mayer, MD;  Location: [facility];  Service: Endoscopy;  Laterality: N/A;  . ESOPHAGOGASTRODUODENOSCOPY N/A 07/22/2013   Procedure: ESOPHAGOGASTRODUODENOSCOPY (EGD);  Surgeon: Irene Shipper, MD;  Location: [facility] ENDOSCOPY;  Service: Endoscopy;  Laterality: N/A;  . ESOPHAGOGASTRODUODENOSCOPY N/A 07/31/2013   Procedure: ESOPHAGOGASTRODUODENOSCOPY (EGD);  Surgeon: Gatha Mayer, MD;  Location: [facility] ENDOSCOPY;  Service: Endoscopy;  Laterality: N/A;  . INTRAOPERATIVE TRANSESOPHAGEAL ECHOCARDIOGRAM N/A 07/18/2013   Procedure: INTRAOPERATIVE TRANSESOPHAGEAL ECHOCARDIOGRAM;  Surgeon: Grace Isaac, MD;  Location: [facility];  Service: Open Heart Surgery;  Laterality: N/A;  . LAPAROSCOPIC GASTRIC BANDING WITH HIATAL HERNIA REPAIR N/A 04/08/2013   Procedure: LAPAROSCOPIC GASTRIC BANDING WITH possible  HIATAL HERNIA REPAIR;  Surgeon: Gayland Curry, MD;  Location: [facility];  Service: General;  Laterality: N/A;  . LITHOTRIPSY    . renal calculi    . SUBXYPHOID PERICARDIAL WINDOW N/A 07/18/2013   Procedure: SUBXYPHOID PERICARDIAL WINDOW;  Surgeon: Grace Isaac, MD;  Location: [facility];  Service: Thoracic;  Laterality: N/A;  . TONSILLECTOMY    . total knee replacment     bilat  . trigger finger repair     also lue, cts surgically repaired  . WISDOM TOOTH EXTRACTION      FAMHx:  Family History  Problem Relation Age of Onset  . Depression Mother   . Hypertension Mother   . Dementia Mother   . Heart disease Father        MI  . Depression Brother   . Stroke Paternal Aunt        CVA  . Diabetes Paternal Uncle   . Heart disease Paternal Uncle        MI  . Heart disease Paternal Grandmother        MI  . Diabetes Cousin        PATERNAL    SOCHx:   reports that he quit smoking about 42 years ago. His smoking use included cigarettes. He started smoking about 55 years ago. He has a 2.60 pack-year smoking history. he has never used smokeless tobacco. He reports that he does not drink alcohol or use drugs.  ALLERGIES:  Allergies  Allergen Reactions  . Morphine Other (See Comments)    Nausea and Severe hallucinations  . Codeine Nausea Only  . Penicillins Other (See Comments)    LOC with shot  . Propoxyphene N-Acetaminophen Nausea Only    ROS: Pertinent items noted in HPI and remainder of comprehensive ROS otherwise negative.  HOME MEDS: Current Outpatient Medications  Medication Sig Dispense Refill  . aspirin 81 MG tablet Take 81 mg by mouth 2 (two) times daily.     [name] atorvastatin (LIPITOR) 80 MG tablet TAKE 1 TABLET EVERY DAY 90 tablet 1  . CALCIUM PO Take 500 mg by mouth 3 (three) times daily.       . Cyanocobalamin (VITAMIN B-12) 1000 MCG SUBL Place 1,000 mcg under the tongue every evening.     [name] Sodium (DSS) 100 MG CAPS Take 100  mg by mouth every other day.    [name] ELIQUIS 5 MG TABS tablet TAKE 1 TABLET BY MOUTH TWICE DAILY 180 tablet 0  . finasteride (PROSCAR) 5 MG tablet Take 1 tablet (5 mg total) by mouth daily. 30 tablet 1  . flecainide (TAMBOCOR) 50 MG tablet Take 1 tablet (50 mg total) by mouth every 12 (twelve) hours. 180 tablet 2  . KLOR-CON M20 20 MEQ tablet TAKE 2 TABLETS BY MOUTH EVERY DAY 180 tablet 0  . loratadine (CLARITIN) 10 MG tablet Take 10 mg by mouth daily.    [name] LORazepam (ATIVAN) 0.5 MG tablet Take 1 tablet (0.5 mg total) by mouth at bedtime. 30 tablet 2  . montelukast (SINGULAIR) 10 MG tablet TAKE 1 TABLET BY MOUTH EVERYDAY AT BEDTIME 30 tablet 5  . pantoprazole (PROTONIX) 40 MG tablet TAKE 1 TABLET BY MOUTH EVERY DAY 90 tablet 1  . rOPINIRole (REQUIP) 0.5 MG tablet TAKE 1 TABLET AT BEDTIME 90 tablet 1  . sertraline (ZOLOFT) 100 MG tablet Take 1.5 tablets (150 mg total) daily by mouth. 45 tablet 2  . torsemide (DEMADEX) 20 MG tablet Take 1 tablet (20 mg total) daily by mouth. 30 tablet 3  . traMADol (ULTRAM) 50 MG tablet Take 1 tablet (50 mg total) by mouth every 8 (eight) hours as needed for severe pain. 20 tablet 0   No current facility-administered medications for this visit.     LABS/IMAGING: Results for orders placed or performed in visit on 02/20/17 (from the past 48 hour(s))  Basic metabolic panel     Status: Abnormal   Collection Time: 02/20/17  9:46 AM  Result Value Ref Range   Glucose 100 (H) 65 - 99 mg/dL   BUN 20 8 - 27 mg/dL   Creatinine, Ser 1.19 0.76 - 1.27 mg/dL   GFR calc non Af Amer 61 >59 mL/min/1.73   GFR calc Af Amer 71 >59 mL/min/1.73   BUN/Creatinine Ratio 17 10 - 24   Sodium 143 134 - 144 mmol/L   Potassium 3.5 3.5 - 5.2 mmol/L   Chloride 100 96 - 106 mmol/L   CO2 29 20 - 29 mmol/L   Calcium 8.7 8.6 - 10.2 mg/dL  Pro b  natriuretic peptide (BNP)     Status: None   Collection Time: 02/20/17  9:46 AM  Result Value Ref Range   NT-Pro BNP 107 0 - 376 pg/mL    Comment: The following cut-points have been suggested for the use of proBNP for the diagnostic evaluation of heart failure (HF) in patients with acute dyspnea: Modality                     Age           Optimal Cut                            (years)            Point ------------------------------------------------------ Diagnosis (rule in HF)        <50            450 pg/mL                           50 - 75            900 pg/mL                               >  75           1800 pg/mL Exclusion (rule out HF)  Age independent     300 pg/mL    No results found.  VITALS: BP (!) 142/80   Pulse 63   Ht 6' (1.829 m)   Wt (!) 357 lb (161.9 kg)   BMI 48.42 kg/m   EXAM: General appearance: alert, no distress and morbidly obese Neck: no carotid bruit, no JVD and thyroid not enlarged, symmetric, no tenderness/mass/nodules Lungs: diminished breath sounds bilaterally Heart: regular rate and rhythm Abdomen: morbidly obese Extremities: extremities normal, atraumatic, no cyanosis or edema Pulses: 2+ and symmetric Skin: pale, warm, diaphoretic Neurologic: Grossly normal Psych: Pleasant  EKG: Normal sinus rhythm at 63, nonspecific ST and T wave changes-personally reviewed  ASSESSMENT: 1. Persistent dyspnea on exertion 2. Recent stroke with partial vocal cord paralysis 3. Paroxysmal atrial fibrillation on flecainide 4. Hyperlipidemia 5. Hypertension 6. Recent history of pericarditis/pericardial effusion status post pericardial window 7. Pulmonary embolus 8. Morbid obesity 9. Obstructive sleep apnea on CPAP  PLAN: 1.   Mr. Alpern continues to be short of breath.  He says he wants to start to get more active.  He continues to use CPAP.  He is contemplating possible vocal cord surgery.  I appreciate the assistance from pulmonary however it appears that  there are no active pulmonary issues.  We will recheck a metabolic profile and probably continue him on torsemide.  Follow-up with me in 6 months.  Pixie Casino, MD, [facility], [place] Director of the Advanced Lipid Disorders &  Cardiovascular Risk Reduction Clinic Attending Cardiologist  Direct Dial: [phone]  Fax: [phone]  Website:  [email or web address] Manders 02/20/2017, 6:18 PM

## 2017-02-23 ENCOUNTER — Other Ambulatory Visit: Payer: Self-pay | Admitting: Internal Medicine

## 2017-02-23 NOTE — Telephone Encounter (Signed)
Furosemide discontinued by Dr. Spero Curb and Dr. Ronnald Ramp.

## 2017-02-25 ENCOUNTER — Encounter: Payer: Self-pay | Admitting: Pulmonary Disease

## 2017-02-27 DIAGNOSIS — J3801 Paralysis of vocal cords and larynx, unilateral: Secondary | ICD-10-CM | POA: Diagnosis not present

## 2017-02-27 DIAGNOSIS — I69391 Dysphagia following cerebral infarction: Secondary | ICD-10-CM | POA: Diagnosis not present

## 2017-02-27 DIAGNOSIS — R490 Dysphonia: Secondary | ICD-10-CM | POA: Diagnosis not present

## 2017-02-27 DIAGNOSIS — Z7901 Long term (current) use of anticoagulants: Secondary | ICD-10-CM | POA: Diagnosis not present

## 2017-02-27 DIAGNOSIS — I69398 Other sequelae of cerebral infarction: Secondary | ICD-10-CM | POA: Diagnosis not present

## 2017-02-27 DIAGNOSIS — R1310 Dysphagia, unspecified: Secondary | ICD-10-CM | POA: Diagnosis not present

## 2017-02-27 DIAGNOSIS — R1319 Other dysphagia: Secondary | ICD-10-CM | POA: Diagnosis not present

## 2017-02-27 DIAGNOSIS — Z87891 Personal history of nicotine dependence: Secondary | ICD-10-CM | POA: Diagnosis not present

## 2017-03-01 ENCOUNTER — Ambulatory Visit (INDEPENDENT_AMBULATORY_CARE_PROVIDER_SITE_OTHER): Payer: Medicare Other | Admitting: Psychiatry

## 2017-03-01 ENCOUNTER — Encounter (HOSPITAL_COMMUNITY): Payer: Self-pay | Admitting: Psychiatry

## 2017-03-01 DIAGNOSIS — F419 Anxiety disorder, unspecified: Secondary | ICD-10-CM

## 2017-03-01 DIAGNOSIS — Z79899 Other long term (current) drug therapy: Secondary | ICD-10-CM | POA: Diagnosis not present

## 2017-03-01 DIAGNOSIS — Z8673 Personal history of transient ischemic attack (TIA), and cerebral infarction without residual deficits: Secondary | ICD-10-CM | POA: Diagnosis not present

## 2017-03-01 DIAGNOSIS — F330 Major depressive disorder, recurrent, mild: Secondary | ICD-10-CM | POA: Diagnosis not present

## 2017-03-01 DIAGNOSIS — Z87891 Personal history of nicotine dependence: Secondary | ICD-10-CM

## 2017-03-01 DIAGNOSIS — Z818 Family history of other mental and behavioral disorders: Secondary | ICD-10-CM

## 2017-03-01 MED ORDER — LORAZEPAM 0.5 MG PO TABS: 0.5000 mg | ORAL_TABLET | Freq: Every day | ORAL | 2 refills | Status: DC

## 2017-03-01 MED ORDER — SERTRALINE HCL 100 MG PO TABS: 150.0000 mg | ORAL_TABLET | Freq: Every day | ORAL | 2 refills | Status: DC

## 2017-03-01 NOTE — Progress Notes (Signed)
[place] MD/PA/NP OP Progress Note  03/01/2017 1:20 PM [name]  MRN:  [id number]  Chief Complaint:  Chief Complaint    Follow-up     HPI: Patient came for his follow-up appointment.  He is compliant with Zoloft and lorazepam.  He is disappointed on Thanksgiving because his wife has a food poisoning and they could not go for family dinner.  But is pleased that wife is much better.  He is sleeping good with the help of Ativan and CPAP.  He denies any major anxiety or nervousness but he cut some time frustrated while he is driving.  But he feels that his medicine is working.  He is having surgical procedure next week at [facility] for his vocal cord.  He is still frustrated with his voice because he has paralysis of vocal cords and sometimes difficulty speaking.  After the stroke he has a left-sided weakness and some time he has tingling and numbness but is happy that he is able to drive if needed.  Patient denies any agitation, anger, mania, psychosis.  He denies drinking alcohol or using any illegal substances.  He is going to see a neurologist in January.  His energy level is fair.  He wants to continue his current medication.  Visit Diagnosis:    ICD-10-CM   1. Major depressive disorder, recurrent episode, mild (HCC) F33.0 LORazepam (ATIVAN) 0.5 MG tablet    sertraline (ZOLOFT) 100 MG tablet    Past Psychiatric History: Reviewed. Patient has one psychiatric admission in 2006 due to increased depression and having suicidal thoughts. He was taking Risperdal but it was discontinued after the stroke in March 2018.    Past Medical History:  Past Medical History:  Diagnosis Date  . Anemia 07/29/2013  . Asthma    childhood asthma  . Benign prostatic hypertrophy    several prostate biopsies neg for cancer.   . Bilateral hip pain   . Cancer ([place])   . CHF (congestive heart failure) ([place])   . Complication of anesthesia    MORPHINE CAUSES HALLUCINATIONS  . CVA (cerebral vascular  accident) ([place]) 06/2016  . Depression   . FH: colonic polyps 2010  . Gout   . History of kidney stones   . Hypertension   . Kidney cysts   . Morbid obesity ([place])   . Numbness    feet / legs  . PAF (paroxysmal atrial fibrillation) with RVR 07/29/2013  . Sleep apnea    USES C PAP    Past Surgical History:  Procedure Laterality Date  . ARTHROSCOPY KNEE W/ DRILLING    . BREATH TEK H PYLORI N/A 12/23/2012   Procedure: BREATH TEK H PYLORI;  Surgeon: Gayland Curry, MD;  Location: [facility];  Service: General;  Laterality: N/A;  . CHOLECYSTECTOMY  1989  . COLONOSCOPY N/A 07/31/2013   Procedure: COLONOSCOPY;  Surgeon: Gatha Mayer, MD;  Location: [place];  Service: Endoscopy;  Laterality: N/A;  . ESOPHAGOGASTRODUODENOSCOPY N/A 07/22/2013   Procedure: ESOPHAGOGASTRODUODENOSCOPY (EGD);  Surgeon: Irene Shipper, MD;  Location: [facility] ENDOSCOPY;  Service: Endoscopy;  Laterality: N/A;  . ESOPHAGOGASTRODUODENOSCOPY N/A 07/31/2013   Procedure: ESOPHAGOGASTRODUODENOSCOPY (EGD);  Surgeon: Gatha Mayer, MD;  Location: [facility] ENDOSCOPY;  Service: Endoscopy;  Laterality: N/A;  . INTRAOPERATIVE TRANSESOPHAGEAL ECHOCARDIOGRAM N/A 07/18/2013   Procedure: INTRAOPERATIVE TRANSESOPHAGEAL ECHOCARDIOGRAM;  Surgeon: Grace Isaac, MD;  Location: [place];  Service: Open Heart Surgery;  Laterality: N/A;  . LAPAROSCOPIC GASTRIC BANDING WITH HIATAL HERNIA REPAIR N/A  04/08/2013   Procedure: LAPAROSCOPIC GASTRIC BANDING WITH possible  HIATAL HERNIA REPAIR;  Surgeon: Gayland Curry, MD;  Location: [facility];  Service: General;  Laterality: N/A;  . LITHOTRIPSY    . renal calculi    . SUBXYPHOID PERICARDIAL WINDOW N/A 07/18/2013   Procedure: SUBXYPHOID PERICARDIAL WINDOW;  Surgeon: Grace Isaac, MD;  Location: [place];  Service: Thoracic;  Laterality: N/A;  . TONSILLECTOMY    . total knee replacment     bilat  . trigger finger repair     also lue, cts surgically repaired  . WISDOM TOOTH EXTRACTION      Family  Psychiatric History: Reviewed.  Family History:  Family History  Problem Relation Age of Onset  . Depression Mother   . Hypertension Mother   . Dementia Mother   . Heart disease Father        MI  . Depression Brother   . Stroke Paternal Aunt        CVA  . Diabetes Paternal Uncle   . Heart disease Paternal Uncle        MI  . Heart disease Paternal Grandmother        MI  . Diabetes Cousin        PATERNAL    Social History:  Social History   Socioeconomic History  . Marital status: Married    Spouse name: None  . Number of children: 1  . Years of education: None  . Highest education level: None  Social Needs  . Financial resource strain: None  . Food insecurity - worry: None  . Food insecurity - inability: None  . Transportation needs - medical: None  . Transportation needs - non-medical: None  Occupational History  . Occupation: retired-Manager [employer]  Tobacco Use  . Smoking status: Former Smoker    Packs/day: 0.20    Years: 13.00    Pack years: 2.60    Types: Cigarettes    Start date: 04/03/1961    Last attempt to quit: 08/30/1974    Years since quitting: 42.5  . Smokeless tobacco: Never Used  Substance and Sexual Activity  . Alcohol use: No    Alcohol/week: 0.0 oz  . Drug use: No  . Sexual activity: Not Currently  Other Topics Concern  . None  Social History Narrative   Lives at home w/ his wife   Right-handed   Caffeine: decaf coffee, occasional tea    Allergies:  Allergies  Allergen Reactions  . Morphine Other (See Comments)    Nausea and Severe hallucinations  . Codeine Nausea Only  . Penicillins Other (See Comments)    LOC with shot  . Propoxyphene N-Acetaminophen Nausea Only    Metabolic Disorder Labs: Lab Results  Component Value Date   HGBA1C 5.8 (H) 06/19/2016   MPG 120 06/19/2016   MPG 128 (H) 12/05/2012   No results found for: PROLACTIN Lab Results  Component Value Date   CHOL 124 06/19/2016   TRIG 74 06/19/2016   HDL 32  (L) 06/19/2016   CHOLHDL 3.9 06/19/2016   VLDL 15 06/19/2016   LDLCALC 77 06/19/2016   LDLCALC 81 04/26/2016   Lab Results  Component Value Date   TSH 2.10 10/24/2016   TSH 2.68 10/20/2015    Therapeutic Level Labs: No results found for: LITHIUM No results found for: VALPROATE No components found for:  CBMZ  Current Medications: Current Outpatient Medications  Medication Sig Dispense Refill  . aspirin 81 MG tablet Take 81 mg  by mouth 2 (two) times daily.     [name] atorvastatin (LIPITOR) 80 MG tablet TAKE 1 TABLET EVERY DAY 90 tablet 1  . CALCIUM PO Take 500 mg by mouth 3 (three) times daily.     . Cyanocobalamin (VITAMIN B-12) 1000 MCG SUBL Place 1,000 mcg under the tongue every evening.     [name] Sodium (DSS) 100 MG CAPS Take 100 mg by mouth every other day.    [name] ELIQUIS 5 MG TABS tablet TAKE 1 TABLET BY MOUTH TWICE DAILY 180 tablet 0  . finasteride (PROSCAR) 5 MG tablet Take 1 tablet (5 mg total) by mouth daily. 30 tablet 1  . flecainide (TAMBOCOR) 50 MG tablet Take 1 tablet (50 mg total) by mouth every 12 (twelve) hours. 180 tablet 2  . KLOR-CON M20 20 MEQ tablet TAKE 2 TABLETS BY MOUTH EVERY DAY 180 tablet 0  . loratadine (CLARITIN) 10 MG tablet Take 10 mg by mouth daily.    [name] LORazepam (ATIVAN) 0.5 MG tablet Take 1 tablet (0.5 mg total) by mouth at bedtime. 30 tablet 2  . pantoprazole (PROTONIX) 40 MG tablet TAKE 1 TABLET BY MOUTH EVERY DAY 90 tablet 1  . rOPINIRole (REQUIP) 0.5 MG tablet TAKE 1 TABLET AT BEDTIME 90 tablet 1  . sertraline (ZOLOFT) 100 MG tablet Take 1.5 tablets (150 mg total) daily by mouth. 45 tablet 2  . torsemide (DEMADEX) 20 MG tablet Take 1 tablet (20 mg total) daily by mouth. 30 tablet 3  . traMADol (ULTRAM) 50 MG tablet Take 1 tablet (50 mg total) by mouth every 8 (eight) hours as needed for severe pain. 20 tablet 0   No current facility-administered medications for this visit.      Musculoskeletal: Strength & Muscle Tone: decreased Gait &  Station: unsteady, use stick to help balance Patient leans: Left  Psychiatric Specialty Exam: Review of Systems  Neurological: Positive for tingling, sensory change and focal weakness.    Blood pressure 132/76, pulse 78, height 6' (1.829 m), weight (!) 359 lb (162.8 kg), SpO2 95 %.Body mass index is 48.69 kg/m.  General Appearance: Casual  Eye Contact:  Good  Speech:  Clear and Coherent  Volume:  Normal  Mood:  Anxious  Affect:  Appropriate  Thought Process:  Goal Directed  Orientation:  Full (Time, Place, and Person)  Thought Content: Logical   Suicidal Thoughts:  No  Homicidal Thoughts:  No  Memory:  Immediate;   Good Recent;   Good Remote;   Good  Judgement:  Good  Insight:  Good  Psychomotor Activity:  Decreased  Concentration:  Concentration: Fair and Attention Span: Fair  Recall:  Good  Fund of Knowledge: Good  Language: Good  Akathisia:  No  Handed:  Right  AIMS (if indicated): not done  Assets:  Communication Skills Desire for Improvement Housing Resilience  ADL's:  Intact  Cognition: WNL  Sleep:  Good   Screenings: PHQ2-9     Office Visit from 07/28/2016 in [facility] Office Visit from 03/30/2014 in [place] from 03/07/2013 in [place] at  [employer] Total Score  0  0  0  0  PHQ-9 Total Score  2  No data  No data  No data       Assessment and Plan: Major depressive disorder, recurrent.  Anxiety disorder NOS.  Doing better on his current medication.  I reviewed  recent blood work results.  He is tolerating his medication and denies any side effects.  Continue Zoloft 150 mg daily and lorazepam 0.5 mg at bedtime.  Patient has no tremors shakes or any EPS.  Discussed medication side effects and benefits.  Discussed benzodiazepine dependence tolerance and withdrawal.  Patient is not interested in  counseling.  Recommended to call us back if there is any question, concern if he feels worsening of the symptoms.  Follow-up in 3 months.   Kathlee Nations, MD 03/01/2017, 1:20 PM

## 2017-03-06 DIAGNOSIS — J3801 Paralysis of vocal cords and larynx, unilateral: Secondary | ICD-10-CM | POA: Diagnosis not present

## 2017-03-14 NOTE — Telephone Encounter (Signed)
Patient called and states that pressure setting for his cpap was set at 18 and a 2 wk auto titration was done and pt would like the results and he would like to know if his pressure will be adjusted - pt can be reached at [phone] -pr

## 2017-03-14 NOTE — Telephone Encounter (Signed)
[facility] will get a download from the 2 week auto titration, the guy that handles this is not here today and will give [place] a call tomorrow.

## 2017-03-14 NOTE — Telephone Encounter (Addendum)
Spoke with pt, he states [facility] did a test and he wants to know if he is on the right pressure. Auto titration was done and given to [facility] on 02/26/2017. I asked them to fax the download to [place] so we can review.

## 2017-03-20 ENCOUNTER — Ambulatory Visit: Payer: Self-pay | Admitting: Physical Medicine & Rehabilitation

## 2017-03-30 ENCOUNTER — Encounter: Payer: Self-pay | Admitting: Physical Medicine & Rehabilitation

## 2017-03-30 ENCOUNTER — Ambulatory Visit (HOSPITAL_BASED_OUTPATIENT_CLINIC_OR_DEPARTMENT_OTHER): Payer: Medicare Other | Admitting: Physical Medicine & Rehabilitation

## 2017-03-30 ENCOUNTER — Encounter: Payer: Medicare Other | Attending: Physical Medicine & Rehabilitation

## 2017-03-30 VITALS — BP 122/72 | HR 77 | Resp 14

## 2017-03-30 DIAGNOSIS — M533 Sacrococcygeal disorders, not elsewhere classified: Secondary | ICD-10-CM | POA: Insufficient documentation

## 2017-03-30 DIAGNOSIS — I4891 Unspecified atrial fibrillation: Secondary | ICD-10-CM | POA: Insufficient documentation

## 2017-03-30 DIAGNOSIS — Z7901 Long term (current) use of anticoagulants: Secondary | ICD-10-CM | POA: Insufficient documentation

## 2017-03-30 DIAGNOSIS — G463 Brain stem stroke syndrome: Secondary | ICD-10-CM | POA: Insufficient documentation

## 2017-03-30 DIAGNOSIS — I10 Essential (primary) hypertension: Secondary | ICD-10-CM | POA: Diagnosis not present

## 2017-03-30 DIAGNOSIS — G4733 Obstructive sleep apnea (adult) (pediatric): Secondary | ICD-10-CM | POA: Diagnosis not present

## 2017-03-30 NOTE — Patient Instructions (Signed)
No need to stop Eliquis for sacroiliac injection

## 2017-03-30 NOTE — Progress Notes (Signed)
Subjective:    Patient ID: [name], male    DOB: 1946-01-19, 71 y.o.   MRN: [id number]  HPI   CC:  R>L low back pain   Chronic low back pain hx since 8th grade gymnastics injury.  Had a fall several months ago with aggravation of pain.  No radiation down legs.  LBP exacerbated by lifting Right leg.  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 General activity 5 Relation with others 5 Enjoyment of life 5 What TIME of day is your pain at its worst? all Sleep (in general) Fair  Pain is worse with: walking, bending, sitting and standing Pain improves with: medication Relief from Meds: 5  Mobility walk with assistance use a cane how many minutes can you walk? 3-5 ability to climb steps?  yes do you drive?  yes  Function retired  Neuro/Psych weakness numbness tingling trouble walking dizziness depression anxiety  Prior Studies Any changes since last visit?  no  Physicians involved in your care Any changes since last visit?  no   Family History  Problem Relation Age of Onset  . Depression Mother   . Hypertension Mother   . Dementia Mother   . Heart disease Father        MI  . Depression Brother   . Stroke Paternal Aunt        CVA  . Diabetes Paternal Uncle   . Heart disease Paternal Uncle        MI  . Heart disease Paternal Grandmother        MI  . Diabetes Cousin        PATERNAL   Social History   Socioeconomic History  . Marital status: Married    Spouse name: None  . Number of children: 1  . Years of education: None  . Highest education level: None  Social Needs  . Financial resource strain: None  . Food insecurity - worry: None  . Food insecurity - inability: None   . Transportation needs - medical: None  . Transportation needs - non-medical: None  Occupational History  . Occupation: retired-Manager [employer]  Tobacco Use  . Smoking status: Former Smoker    Packs/day: 0.20    Years: 13.00    Pack years: 2.60    Types: Cigarettes    Start date: 04/03/1961    Last attempt to quit: 08/30/1974    Years since quitting: 42.6  . Smokeless tobacco: Never Used  Substance and Sexual Activity  . Alcohol use: No    Alcohol/week: 0.0 oz  . Drug use: No  . Sexual activity: Not Currently  Other Topics Concern  . None  Social History Narrative   Lives at home w/ his wife   Right-handed   Caffeine: decaf coffee, occasional tea   Past Surgical History:  Procedure Laterality Date  . ARTHROSCOPY KNEE W/ DRILLING    . BREATH TEK H PYLORI N/A 12/23/2012   Procedure: BREATH TEK H PYLORI;  Surgeon: Gayland Curry, MD;  Location: [facility];  Service: General;  Laterality: N/A;  . CHOLECYSTECTOMY  1989  . COLONOSCOPY N/A 07/31/2013   Procedure: COLONOSCOPY;  Surgeon: Gatha Mayer, MD;  Location:  [place] [facility];  Service: Endoscopy;  Laterality: N/A;  . ESOPHAGOGASTRODUODENOSCOPY N/A 07/22/2013   Procedure: ESOPHAGOGASTRODUODENOSCOPY (EGD);  Surgeon: Irene Shipper, MD;  Location: [facility] ENDOSCOPY;  Service: Endoscopy;  Laterality: N/A;  . ESOPHAGOGASTRODUODENOSCOPY N/A 07/31/2013   Procedure: ESOPHAGOGASTRODUODENOSCOPY (EGD);  Surgeon: Gatha Mayer, MD;  Location: [facility] ENDOSCOPY;  Service: Endoscopy;  Laterality: N/A;  . INTRAOPERATIVE TRANSESOPHAGEAL ECHOCARDIOGRAM N/A 07/18/2013   Procedure: INTRAOPERATIVE TRANSESOPHAGEAL ECHOCARDIOGRAM;  Surgeon: Grace Isaac, MD;  Location: [place];  Service: Open Heart Surgery;  Laterality: N/A;  . LAPAROSCOPIC GASTRIC BANDING WITH HIATAL HERNIA REPAIR N/A 04/08/2013   Procedure: LAPAROSCOPIC GASTRIC BANDING WITH possible  HIATAL HERNIA REPAIR;  Surgeon: Gayland Curry, MD;  Location: [facility];  Service: General;  Laterality: N/A;  .  LITHOTRIPSY    . renal calculi    . SUBXYPHOID PERICARDIAL WINDOW N/A 07/18/2013   Procedure: SUBXYPHOID PERICARDIAL WINDOW;  Surgeon: Grace Isaac, MD;  Location: [place];  Service: Thoracic;  Laterality: N/A;  . TONSILLECTOMY    . total knee replacment     bilat  . trigger finger repair     also lue, cts surgically repaired  . WISDOM TOOTH EXTRACTION     Past Medical History:  Diagnosis Date  . Anemia 07/29/2013  . Asthma    childhood asthma  . Benign prostatic hypertrophy    several prostate biopsies neg for cancer.   . Bilateral hip pain   . Cancer ([place])   . CHF (congestive heart failure) ([place])   . Complication of anesthesia    MORPHINE CAUSES HALLUCINATIONS  . CVA (cerebral vascular accident) ([place]) 06/2016  . Depression   . FH: colonic polyps 2010  . Gout   . History of kidney stones   . Hypertension   . Kidney cysts   . Morbid obesity ([place])   . Numbness    feet / legs  . PAF (paroxysmal atrial fibrillation) with RVR 07/29/2013  . Sleep apnea    USES C PAP   BP 122/72 (BP Location: Left Arm, Patient Position: Sitting, Cuff Size: Large)   Pulse 77   Resp 14   SpO2 95%   Opioid Risk Score:   Fall Risk Score:  `1  Depression screen PHQ 2/9  Depression screen [facility] 2/9 07/28/2016 02/16/2015 03/30/2014  Decreased Interest 0 0 0  Down, Depressed, Hopeless 0 0 0  PHQ - 2 Score 0 0 0  Altered sleeping 0 - -  Tired, decreased energy 1 - -  Change in appetite 0 - -  Feeling bad or failure about yourself  0 - -  Trouble concentrating 1 - -  Moving slowly or fidgety/restless 0 - -  PHQ-9 Score 2 - -  Difficult doing work/chores Not difficult at all - -  Some recent data might be hidden    Review of Systems  Constitutional: Positive for unexpected weight change.  HENT: Negative.   Eyes: Negative.   Respiratory: Positive for apnea, shortness of breath and wheezing.   Cardiovascular: Positive for leg swelling.  Gastrointestinal: Negative.   Musculoskeletal:  Positive for back pain and gait problem.  Allergic/Immunologic: Negative.   Neurological: Positive for dizziness, weakness and numbness.       Tingling   Hematological: Negative.   Psychiatric/Behavioral: Positive for dysphoric mood. The patient is nervous/anxious.        Objective:   Physical Exam  Constitutional: He is oriented to person, place, and time. He appears well-developed and well-nourished.  HENT:  Head: Normocephalic and atraumatic.  Eyes: Conjunctivae and EOM are normal. Pupils are equal, round, and reactive to light.  Neurological: He is alert and oriented to person, place, and time.  Skin: He is not diaphoretic.  Psychiatric: He has a normal mood and affect.  Nursing note and vitals reviewed.  Negative straight leg raising test Faber's test refers pain to the sacroiliac area right side greater than left side. Motor strength is 5/5 bilateral hip flexor knee extensor ankle dorsiflexor. Ambulates with cane wide-based support no evidence of toe drag or knee instability. Lumbar spine has reduced range of motion with flexion and extension approximately 50%.  No directional component Mild tenderness palpation over the right PSIS       Assessment & Plan:  #1.  Acute exacerbation of chronic low back pain his pain has been increased for the last 2 months, exam is most consistent with sacroiliac pain. Given lack of improvement with conservative care would recommend right sacroiliac injection under fluoroscopic guidance, contrast enhancement. No need for holding Eliquis

## 2017-04-06 ENCOUNTER — Encounter: Payer: Self-pay | Admitting: Internal Medicine

## 2017-04-06 ENCOUNTER — Ambulatory Visit (INDEPENDENT_AMBULATORY_CARE_PROVIDER_SITE_OTHER): Payer: Medicare Other | Admitting: Internal Medicine

## 2017-04-06 VITALS — BP 132/64 | HR 78 | Ht 72.0 in | Wt 357.8 lb

## 2017-04-06 DIAGNOSIS — J0100 Acute maxillary sinusitis, unspecified: Secondary | ICD-10-CM

## 2017-04-06 DIAGNOSIS — G4733 Obstructive sleep apnea (adult) (pediatric): Secondary | ICD-10-CM

## 2017-04-06 MED ORDER — PHENYLEPHRINE HCL 1 % NA SOLN
1.0000 [drp] | Freq: Once | NASAL | Status: AC
  Administered 2017-04-06: 1 [drp] via NASAL

## 2017-04-06 MED ORDER — METHYLPREDNISOLONE ACETATE 80 MG/ML IJ SUSP
80.0000 mg | Freq: Once | INTRAMUSCULAR | Status: AC
  Administered 2017-04-06: 80 mg via INTRAMUSCULAR

## 2017-04-06 MED ORDER — DOXYCYCLINE HYCLATE 100 MG PO TABS: 100.0000 mg | ORAL_TABLET | Freq: Two times a day (BID) | ORAL | 1 refills | Status: DC

## 2017-04-06 NOTE — Assessment & Plan Note (Signed)
Download confirms excellent compliance that he definitely sleeps better with CPAP.  Control is not quite as good as I would like.  Not clear why he is been shifted back to a fixed pressure. Plan-return CPAP to auto 10-20.  Recommend weight loss.

## 2017-04-06 NOTE — Progress Notes (Signed)
HPI male former smoker followed for OSA, Restless Legs,, complicated by A. fib, recurrent PE, bariatric surgery, major depression, dysphonia, midbrain CVA NPSG 10/25/05- severe OSA, AHI 79/ hr, CPAP to 16. Frequent limb jerks with sleep disturbance PFT 01/03/17-minimal obstruction without response to bronchodilator.  FVC 3.94/83%, FEV1 3.00/86%, ratio 2.76, TLC 96%, DLCO 78%  ----------------------------------------------------------------------------------- 04/06/17- 72 year old male former smoker followed for OSA, Restless Legs,, complicated by A. fib, recurrent PE, bariatric surgery, major depression, dysphonia, midbrain CVA CPAP auto 10-20/Advanced PFT 01/03/17-minimal obstruction without response to bronchodilator.  FVC 3.94/83%, FEV1 3.00/86%, ratio 2.76, TLC 96%, DLCO 78% CPAP desensitization mask fit 02/20/17 Echo 06/19/16-EF 60-65% ----having some weakness,body aches,headache, cough with brown,green,thick sputum,sore throat,started 03/10/17, Mucinex made him feel worse.   He has had a bad cold for a week and a half-mostly significant nasal congestion and drainage, feels stopped up.  Relatively little cough or wheeze.  No fever. Always feels unsteady since mid brain stroke which affected vision and balance.  Residual left vocal cord paralysis treated at [facility].  He has to be careful not to aspirate. CPAP download-97% compliance, AHI 9.0/hour.  At fixed pressure at 18.  ROS-see HPI   Negative unless "+" Constitutional:    weight loss, night sweats, fevers, chills, fatigue, lassitude. HEENT:    headaches, difficulty swallowing, tooth/dental problems, sore throat,       sneezing, itching, ear ache,+ nasal congestion, post nasal drip, snoring CV:    chest pain, orthopnea, PND, swelling in lower extremities, anasarca,                                                   dizziness, +palpitations Resp:   +shortness of breath with exertion or at rest.                productive cough,   non-productive  cough, coughing up of blood.              change in color of mucus.  wheezing.   Skin:    rash or lesions. GI:  No-   heartburn, indigestion, abdominal pain, nausea, vomiting,  GU:  MS:   joint pain, stiffness, Neuro-     nothing unusual Psych:  change in mood or affect.  depression or anxiety.   memory loss.  OBJ- Physical Exam General- Alert, Oriented, Affect-appropriate, Distress- none acute, +obese, wheelchair  Skin- rash-none, lesions- none, excoriation- none Lymphadenopathy- none Head- atraumatic            Eyes- Gross vision intact, PERRLA, conjunctivae and secretions clear            Ears- +Hearing aid            Nose-  +moderate turbinate edema and mucus congestion, no-Septal dev, mucus, polyps,                         erosion, perforation             Throat- Mallampati III , mucosa clear , drainage- none, tonsils- atrophic Neck- flexible , trachea midline, no stridor , thyroid nl, carotid no bruit Chest - symmetrical excursion , unlabored           Heart/CV- RRR , no murmur , no gallop  , no rub, nl s1 s2                           -  JVD- none , edema- none, stasis changes- none, varices- none           Lung- clear to P&A, wheeze- none, cough- none , dullness-none, rub- none           Chest wall-  Abd-  Br/ Gen/ Rectal- Not done, not indicated Extrem- cyanosis- none, clubbing, none, atrophy- none, strength- nl Neuro- grossly intact to observation

## 2017-04-06 NOTE — Assessment & Plan Note (Signed)
I do not expect him to be a candidate for bariatric surgery because of his complicated medical history.  Emphasis on counseling and dietary discretion are recommended.

## 2017-04-06 NOTE — Patient Instructions (Signed)
Order- neb neo nasal       Dx acute maxillary sinusitis              Depo 44  Order- [facility]- change CPAP to auto 10-20, continue mask of choice, humidifier, supplies, AirView     Script sent doxycycline antibiotic

## 2017-04-09 ENCOUNTER — Telehealth: Payer: Self-pay | Admitting: Internal Medicine

## 2017-04-09 DIAGNOSIS — G4733 Obstructive sleep apnea (adult) (pediatric): Secondary | ICD-10-CM

## 2017-04-09 NOTE — Telephone Encounter (Signed)
lmtcb x1 for [name] with [facility]. 

## 2017-04-10 NOTE — Telephone Encounter (Signed)
Spoke with [name] at [facility], states that because his current cpap is not auto-titrating capable, he is on a loaner cpap.  His pressure will either need to be a set pressure, or patient will have to purchase an auto-titrating machine out of pocket per [facility].  Download printed and placed on CY's desk- CY please advise if a set pressure can be prescribed for patient.  Thanks!

## 2017-04-11 ENCOUNTER — Other Ambulatory Visit: Payer: Self-pay | Admitting: Internal Medicine

## 2017-04-12 NOTE — Telephone Encounter (Signed)
CY, please advise. Thanks!  

## 2017-04-16 NOTE — Telephone Encounter (Signed)
Please order [facility]ME [facility] change CPAP setting to 20 cwp,    Dx OSA

## 2017-04-16 NOTE — Telephone Encounter (Signed)
CY - please advise. Thanks! 

## 2017-04-16 NOTE — Telephone Encounter (Signed)
Order has been placed per CY. Nothing further was needed.

## 2017-04-17 ENCOUNTER — Ambulatory Visit (INDEPENDENT_AMBULATORY_CARE_PROVIDER_SITE_OTHER): Payer: Medicare Other | Admitting: Neurology

## 2017-04-17 ENCOUNTER — Encounter: Payer: Self-pay | Admitting: Neurology

## 2017-04-17 VITALS — BP 134/75 | HR 76 | Wt 360.4 lb

## 2017-04-17 DIAGNOSIS — I639 Cerebral infarction, unspecified: Secondary | ICD-10-CM

## 2017-04-17 NOTE — Progress Notes (Signed)
[facility] [address] [place]. [place] [address] [phone]       OFFICE FOLLOW-UP NOTE  [name] Date of Birth:  Jul 03, 1945 Medical Record Number:  [id number]   HPI: Initial visit 10/02/2016 [name] is a 72 year old Caucasian male seen today for first office follow-up visit for in-hospital admission in March 2018 for stroke. He is accompanied by his wife. History is obtained from the patient, wife, review of electronic medical records and I have personally viewed imaging for lemons and lab results [name] is a 72 y.o. male who was in his normal state of health until around 1:30 AM on 06/19/16.[name] He was in bed watching a movie when he had sudden onset left-sided head pain. He states that he then tried to stand up and found that he was leaning to the left. 911 was called and he was brought in as a code stroke. 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 There was a remote age pontine microhemorrhages noted. Transthoracic echo showed normal ejection fraction. LDL cholesterol was borderline at 77 (specimen C was 5.8. Patient was continued on his home aspirin 81 eliquis as he stated he had a bad experience on Xarelto did not want to switch to Pradaxa after discussing risks and benefits. Patient has been getting physical and occupational therapy  and noticing improvement. Diplopia has resolved. His voice is soft and hoarse yet. Still has some intermittent tingling numbnessc in his extremities. His balance is off. But getting better. Gets dizzy and off balance particularly when he moves quickly. He mostly uses a wheeled walker and has had no falls. He can walk short distances without a  walker. Update 04/17/2017 : He returns for follow-up after last visit 6 months ago. He states his noticed some improvement in his walking and balance and now mostly uses a 4 pronged cane. He however did have a cough all around Thanksgiving when he fell up the stairs. He did injure his neck and upper back but now it seems to be improving. He was prescribed tramadol but it did not take it for long period he continues to have low back pain and he sees Dr. Read [name] so plans to do spinal injection soon. He does stumble easily and even with a cane he feels his balance is not good and is not quite stable. Patient states that he does not like using a walker and prefers a cane instead. He remains on aspirin and eliquis but states he has no significant cardiac history cardiac stents or cardiac surgery. States his blood pressure is well controlled today is 134/75. His tolerating Lipitor well without muscle aches and pains . ROS:   14 system review of systems is positive for  hearing loss, runny nose, shortness of breath, cold and heat intolerance, restless leg, sleep apnea, snoring, acting out dreams, back and joint pain, walking difficulty, dizziness, numbness, easy bruising and bleeding and depression and all other systems negative  PMH:  Past Medical History:  Diagnosis Date  . Anemia 07/29/2013  . Asthma    childhood asthma  . Benign prostatic hypertrophy    several prostate biopsies neg for cancer.   . Bilateral hip pain   .  Cancer ([place])   . CHF (congestive heart failure) ([place])   . Complication of anesthesia    MORPHINE CAUSES HALLUCINATIONS  . CVA (cerebral vascular accident) ([place]) 06/2016  . Depression   . FH: colonic polyps 2010  . Gout   . History of kidney stones   . Hypertension   . Kidney cysts   . Morbid obesity ([place])   . Numbness    feet / legs  . PAF (paroxysmal atrial fibrillation) with RVR 07/29/2013  . Sleep apnea    USES C PAP    Social History:  Social History    Socioeconomic History  . Marital status: Married    Spouse name: Not on file  . Number of children: 1  . Years of education: Not on file  . Highest education level: Not on file  Social Needs  . Financial resource strain: Not on file  . Food insecurity - worry: Not on file  . Food insecurity - inability: Not on file  . Transportation needs - medical: Not on file  . Transportation needs - non-medical: Not on file  Occupational History  . Occupation: retired-Manager [employer]  Tobacco Use  . Smoking status: Former Smoker    Packs/day: 0.20    Years: 13.00    Pack years: 2.60    Types: Cigarettes    Start date: 04/03/1961    Last attempt to quit: 08/30/1974    Years since quitting: 42.6  . Smokeless tobacco: Never Used  Substance and Sexual Activity  . Alcohol use: No    Alcohol/week: 0.0 oz  . Drug use: No  . Sexual activity: Not Currently  Other Topics Concern  . Not on file  Social History Narrative   Lives at home w/ his wife   Right-handed   Caffeine: decaf coffee, occasional tea    Medications:   Current Outpatient Medications on File Prior to Visit  Medication Sig Dispense Refill  . apixaban (ELIQUIS) 5 MG TABS tablet     . Aspirin-Calcium Carbonate 81-777 MG TABS Take by mouth.    [name] atorvastatin (LIPITOR) 80 MG tablet TAKE 1 TABLET EVERY DAY 90 tablet 1  . CALCIUM PO Take 500 mg by mouth 2 (two) times daily.     . Cyanocobalamin (VITAMIN B-12) 1000 MCG SUBL Place 5,000 mcg under the tongue every evening.     [name] Sodium (DSS) 100 MG CAPS Take 100 mg by mouth every other day.    [name] ELIQUIS 5 MG TABS tablet TAKE 1 TABLET BY MOUTH TWICE DAILY 180 tablet 0  . finasteride (PROSCAR) 5 MG tablet Take 1 tablet (5 mg total) by mouth daily. 30 tablet 1  . flecainide (TAMBOCOR) 50 MG tablet Take 1 tablet (50 mg total) by mouth every 12 (twelve) hours. 180 tablet 2  . KLOR-CON M20 20 MEQ tablet TAKE 2 TABLETS BY MOUTH EVERY DAY 180 tablet 0  . loratadine (CLARITIN) 10  MG tablet Take 10 mg by mouth daily.    [name] LORazepam (ATIVAN) 0.5 MG tablet Take 1 tablet (0.5 mg total) by mouth at bedtime. 30 tablet 2  . montelukast (SINGULAIR) 10 MG tablet TAKE 1 TABLET BY MOUTH EVERYDAY AT BEDTIME 30 tablet 5  . pantoprazole (PROTONIX) 40 MG tablet TAKE 1 TABLET BY MOUTH EVERY DAY 90 tablet 1  . rOPINIRole (REQUIP) 0.5 MG tablet TAKE 1 TABLET AT BEDTIME 90 tablet 1  . sertraline (ZOLOFT) 100 MG tablet Take 1.5 tablets (150 mg total) by mouth daily. [place]  tablet 2  . torsemide (DEMADEX) 20 MG tablet Take 1 tablet (20 mg total) daily by mouth. 30 tablet 3  . traMADol (ULTRAM) 50 MG tablet Take 1 tablet (50 mg total) by mouth every 8 (eight) hours as needed for severe pain. 20 tablet 0  . [DISCONTINUED] furosemide (LASIX) 40 MG tablet Increase to 80mg  daily X5 days, then resume normal dose of 40mg  daily. 10 tablet 0   No current facility-administered medications on file prior to visit.     Allergies:   Allergies  Allergen Reactions  . Morphine Other (See Comments)    Nausea and Severe hallucinations  . Codeine Nausea Only  . Penicillins Other (See Comments)    LOC with shot  . Propoxyphene N-Acetaminophen Nausea Only    Physical Exam General: Obese elderly Caucasian male, seated, in no evident distress Head: head normocephalic and atraumatic.  Neck: supple with no carotid or supraclavicular bruits Cardiovascular: regular rate and rhythm, no murmurs Musculoskeletal: no deformity Skin:  no rash/petichiae Vascular:  Normal pulses all extremities Vitals:   04/17/17 1310  BP: 134/75  Pulse: 76   Neurologic Exam Mental Status: Awake and fully alert. Oriented to place and time. Recent and remote memory intact. Attention span, concentration and fund of knowledge appropriate. Mood and affect appropriate.  Cranial Nerves: Fundoscopic exam reveals sharp disc margins. Pupils equal, briskly reactive to light. Extraocular movements full without nystagmus. Visual fields full  to confrontation. Hearing intact. Facial sensation intact. Face, tongue, palate moves normally and symmetrically.  Motor: Normal bulk and tone. Normal strength in all tested extremity muscles. Sensory.: Diminished left hemibody sensation to touch ,pinprick.Marland KitchenPosition and vibratory sensation diminished bilaterally over the toes Positive Romberg's sign Coordination: Rapid alternating movements normal in all extremities. Finger-to-nose and heel-to-shin performed accurately bilaterally. Gait and Station: Arises from chair with  difficulty. Stance is broad-based. Gait demonstrates mild imbalance and uses a 4 pronged cane. Unsteady while standing on a narrow base and either leg unsupported.  Reflexes: 1+ and symmetric except both ankle jerks are absent and knee jerks are depressed.. Toes downgoing.   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 Multiple vascular risk factors of atrial fibrillation, obesity, diabetes, hypertension hyperlipidemia    PLAN: I had a long d/w patient about his recent  Medullary stroke,gait imbalance risk for recurrent stroke/TIAs, personally independently reviewed imaging studies and stroke evaluation results and answered questions.Continue Eliquis (apixaban) daily  for secondary stroke prevention  but discontinue aspirin as he does not have a strong reason to take both and maintain strict control of hypertension with blood pressure goal below 130/90, diabetes with hemoglobin A1c goal below 6.5% and lipids with LDL cholesterol goal below 70 mg/dL. I also advised the patient to eat a healthy diet with plenty of whole grains, cereals, fruits and vegetables, exercise regularly and maintain ideal body weight .I have advised him to use a cane or walker at all times for balance and also talked about  fall and safety precautions. He plans to have a spinal injection by Dr. Dianna Limbo and may need to hold eliquis for 2 days prior to the procedure with a small but acceptable. The procedural risk of TIA/stroke if he is willing. Followup in the future with my nurse practitioner in one year or call earlier if necessary. Greater than 50% of time  during this 30 minute visit was spent on counseling,explanation of diagnosis, planning of further management, discussion with patient and family and coordination of care  Antony Contras, MD Medical Director [place] Pager: [phone] 04/17/2017 2:49 PM  Note: This document was prepared with digital dictation and possible smart phrase technology. Any transcriptional errors that result from this process are unintentional

## 2017-04-17 NOTE — Patient Instructions (Addendum)
I had a long d/w patient about his recent  Medullary stroke,gait imbalance risk for recurrent stroke/TIAs, personally independently reviewed imaging studies and stroke evaluation results and answered questions.Continue Eliquis (apixaban) daily  for secondary stroke prevention  but discontinue aspirin as he does not have a strong reason to take both and maintain strict control of hypertension with blood pressure goal below 130/90, diabetes with hemoglobin A1c goal below 6.5% and lipids with LDL cholesterol goal below 70 mg/dL. I also advised the patient to eat a healthy diet with plenty of whole grains, cereals, fruits and vegetables, exercise regularly and maintain ideal body weight .I have advised him to use a cane or walker at all times for balance and also talked about fall and safety precautions. He plans to have a spinal injection by Dr. Dianna Limbo and may need to hold eliquis for 2 days prior to the procedure with a small but acceptable. The procedural risk of TIA/stroke if he is willing. Followup in the future with my nurse practitioner in one year or call earlier if necessary.

## 2017-04-19 ENCOUNTER — Encounter: Payer: Self-pay | Admitting: Physical Medicine & Rehabilitation

## 2017-04-19 ENCOUNTER — Encounter: Payer: Medicare Other | Attending: Physical Medicine & Rehabilitation

## 2017-04-19 ENCOUNTER — Other Ambulatory Visit: Payer: Self-pay | Admitting: Internal Medicine

## 2017-04-19 ENCOUNTER — Other Ambulatory Visit: Payer: Self-pay | Admitting: Physical Medicine & Rehabilitation

## 2017-04-19 ENCOUNTER — Ambulatory Visit (HOSPITAL_BASED_OUTPATIENT_CLINIC_OR_DEPARTMENT_OTHER): Payer: Medicare Other | Admitting: Physical Medicine & Rehabilitation

## 2017-04-19 ENCOUNTER — Telehealth: Payer: Self-pay

## 2017-04-19 VITALS — BP 111/72 | HR 68

## 2017-04-19 DIAGNOSIS — G4733 Obstructive sleep apnea (adult) (pediatric): Secondary | ICD-10-CM | POA: Diagnosis not present

## 2017-04-19 DIAGNOSIS — Z7901 Long term (current) use of anticoagulants: Secondary | ICD-10-CM | POA: Insufficient documentation

## 2017-04-19 DIAGNOSIS — I10 Essential (primary) hypertension: Secondary | ICD-10-CM | POA: Diagnosis not present

## 2017-04-19 DIAGNOSIS — M533 Sacrococcygeal disorders, not elsewhere classified: Secondary | ICD-10-CM | POA: Diagnosis not present

## 2017-04-19 DIAGNOSIS — I4891 Unspecified atrial fibrillation: Secondary | ICD-10-CM | POA: Diagnosis not present

## 2017-04-19 DIAGNOSIS — G463 Brain stem stroke syndrome: Secondary | ICD-10-CM | POA: Diagnosis not present

## 2017-04-19 NOTE — Telephone Encounter (Signed)
May call in tramadol 50 mg #60 1 p.o. twice daily 1 month supply no refills

## 2017-04-19 NOTE — Progress Notes (Signed)
Sacroiliac injection was performed today. A combination of a numbing medicine plus a cortisone medicine was injected. The injection was done under x-ray guidance with contrast enhancement. This procedure has been performed to help reduce low back and buttocks pain as well as potentially hip pain. The duration of this injection is variable lasting from hours to  Months. It may repeated if needed. 

## 2017-04-19 NOTE — Telephone Encounter (Signed)
Patient called stating that he was hoping to get a refill of tramadol.

## 2017-04-19 NOTE — Progress Notes (Signed)
  [facility]   Name: MUNEEB VERAS DOB:Nov 14, 1945 MRN: [id number]  Date:04/19/2017  Physician: Alysia Penna, MD    Nurse/CMA: Annalicia Renfrew CMA  Allergies:  Allergies  Allergen Reactions  . Morphine Other (See Comments)    Nausea and Severe hallucinations  . Codeine Nausea Only  . Penicillins Other (See Comments)    LOC with shot  . Propoxyphene N-Acetaminophen Nausea Only    Consent Signed: Yes.    Is patient diabetic? No.  CBG today? NA  Pregnant: No. LMP: No LMP for male patient. (age 2-55)  Anticoagulants: yes (eliquis yesterday) Anti-inflammatory: no Antibiotics: no  Procedure: Right Sacroiliac injection        Position: Prone   Start Time: 1158pm  End Time: 1203pm  Fluoro Time: 24s  RN/CMA Sherae Santino CMA Morrison Mcbryar CMA    Time 1136pm 1209pm    BP 111/72 133/76    Pulse 68 69    Respirations 16 16    O2 Sat 95 96    S/S 6 6    Pain Level 7/10 6/10     D/C home with Wife, patient A & O X 3, D/C instructions reviewed, and sits independently.

## 2017-04-19 NOTE — Patient Instructions (Signed)
Sacroiliac injection was performed today. A combination of a naming medicine plus a cortisone medicine was injected. The injection was done under x-ray guidance. This procedure has been performed to help reduce low back and buttocks pain as well as potentially hip pain. The duration of this injection is variable lasting from hours to  Months. It may repeated if needed. 

## 2017-04-20 ENCOUNTER — Other Ambulatory Visit: Payer: Self-pay | Admitting: Internal Medicine

## 2017-04-20 MED ORDER — TRAMADOL HCL 50 MG PO TABS: 50.0000 mg | ORAL_TABLET | Freq: Two times a day (BID) | ORAL | 0 refills | Status: DC

## 2017-04-20 NOTE — Telephone Encounter (Signed)
Medication called in to pharmacy.

## 2017-04-24 ENCOUNTER — Ambulatory Visit (INDEPENDENT_AMBULATORY_CARE_PROVIDER_SITE_OTHER): Payer: Medicare Other | Admitting: Internal Medicine

## 2017-04-24 ENCOUNTER — Encounter: Payer: Self-pay | Admitting: Internal Medicine

## 2017-04-24 VITALS — BP 134/78 | HR 72 | Temp 97.6°F | Resp 16 | Ht 73.0 in | Wt 362.0 lb

## 2017-04-24 DIAGNOSIS — I6310 Cerebral infarction due to embolism of unspecified precerebral artery: Secondary | ICD-10-CM | POA: Diagnosis not present

## 2017-04-24 DIAGNOSIS — I480 Paroxysmal atrial fibrillation: Secondary | ICD-10-CM

## 2017-04-24 DIAGNOSIS — I10 Essential (primary) hypertension: Secondary | ICD-10-CM

## 2017-04-24 NOTE — Patient Instructions (Signed)

## 2017-04-24 NOTE — Progress Notes (Signed)
Subjective:  Patient ID: [name], male    DOB: Jan 21, 1946  Age: 72 y.o. MRN: [id number]  CC: Atrial Fibrillation and Hypertension   HPI [name] presents for f/up - He offers no new complaints today.  He continues to make progress after his CVA from about a year ago.  His gait, balance, and endurance are all improving with therapy.  Outpatient Medications Prior to Visit  Medication Sig Dispense Refill  . apixaban (ELIQUIS) 5 MG TABS tablet     . atorvastatin (LIPITOR) 80 MG tablet TAKE 1 TABLET EVERY DAY 90 tablet 1  . CALCIUM PO Take 500 mg by mouth 2 (two) times daily.     . Cyanocobalamin (VITAMIN B-12) 1000 MCG SUBL Place 5,000 mcg under the tongue every evening.     [name] Sodium (DSS) 100 MG CAPS Take 100 mg by mouth every other day.    [name] ELIQUIS 5 MG TABS tablet TAKE 1 TABLET BY MOUTH TWICE DAILY 180 tablet 0  . finasteride (PROSCAR) 5 MG tablet Take 1 tablet (5 mg total) by mouth daily. 30 tablet 1  . flecainide (TAMBOCOR) 50 MG tablet TAKE 1 TABLET BY MOUTH EVERY 12 HOURS 180 tablet 0  . loratadine (CLARITIN) 10 MG tablet Take 10 mg by mouth daily.    [name] LORazepam (ATIVAN) 0.5 MG tablet Take 1 tablet (0.5 mg total) by mouth at bedtime. 30 tablet 2  . montelukast (SINGULAIR) 10 MG tablet TAKE 1 TABLET BY MOUTH EVERYDAY AT BEDTIME 30 tablet 5  . pantoprazole (PROTONIX) 40 MG tablet TAKE 1 TABLET BY MOUTH EVERY DAY 90 tablet 1  . potassium chloride SA (KLOR-CON M20) 20 MEQ tablet Take 2 tablets (40 mEq total) by mouth daily. 180 tablet 1  . rOPINIRole (REQUIP) 0.5 MG tablet TAKE 1 TABLET AT BEDTIME 90 tablet 1  . sertraline (ZOLOFT) 100 MG tablet Take 1.5 tablets (150 mg total) by mouth daily. 45 tablet 2  . torsemide (DEMADEX) 20 MG tablet Take 1 tablet (20 mg total) daily by mouth. 30 tablet 3  . traMADol (ULTRAM) 50 MG tablet Take 1 tablet (50 mg total) by mouth 2 (two) times daily. 60 tablet 0   No facility-administered medications prior to visit.      ROS Review of Systems  Constitutional: Positive for fatigue. Negative for appetite change, diaphoresis and unexpected weight change.  HENT: Positive for voice change. Negative for trouble swallowing.   Eyes: Negative for visual disturbance.  Respiratory: Negative for cough, chest tightness, shortness of breath and wheezing.   Cardiovascular: Positive for leg swelling. Negative for chest pain and palpitations.  Gastrointestinal: Negative for abdominal pain, constipation, diarrhea and vomiting.  Genitourinary: Negative.   Musculoskeletal: Positive for arthralgias, back pain and gait problem. Negative for joint swelling and neck pain.  Skin: Negative.  Negative for color change and rash.  Neurological: Positive for dizziness and light-headedness. Negative for syncope, speech difficulty and weakness.  Hematological: Negative for adenopathy. Does not bruise/bleed easily.  Psychiatric/Behavioral: Negative.  Negative for decreased concentration, dysphoric mood and sleep disturbance. The patient is not nervous/anxious.     Objective:  BP 134/78 (BP Location: Left Arm, Patient Position: Sitting, Cuff Size: Large)   Pulse 72   Temp 97.6 F (36.4 C) (Oral)   Resp 16   Ht 6\' 1"  (1.854 m)   Wt (!) 362 lb 0.6 oz (164.2 kg)   SpO2 99%   BMI 47.77 kg/m   BP Readings from Last 3  Encounters:  04/24/17 134/78  04/19/17 111/72  04/17/17 134/75    Wt Readings from Last 3 Encounters:  04/24/17 (!) 362 lb 0.6 oz (164.2 kg)  04/17/17 (!) 360 lb 6.4 oz (163.5 kg)  04/06/17 (!) 357 lb 12.8 oz (162.3 kg)    Physical Exam  Constitutional: He is oriented to person, place, and time. No distress.  HENT:  Mouth/Throat: Oropharynx is clear and moist. No oropharyngeal exudate.  Eyes: Conjunctivae are normal. Left eye exhibits no discharge. No scleral icterus.  Neck: Normal range of motion. Neck supple. No JVD present. No thyromegaly present.  Cardiovascular: Normal rate, regular rhythm and normal  heart sounds. Exam reveals no gallop.  No murmur heard. Pulmonary/Chest: Effort normal and breath sounds normal. No respiratory distress. He has no wheezes. He has no rales.  Abdominal: Soft. Bowel sounds are normal. He exhibits no mass. There is no tenderness.  Musculoskeletal: Normal range of motion. He exhibits edema (1+ pitting edema in BLE). He exhibits no tenderness or deformity.  Neurological: He is alert and oriented to person, place, and time.  Skin: Skin is warm and dry. No rash noted. He is not diaphoretic. No erythema. No pallor.  Psychiatric: He has a normal mood and affect. His behavior is normal. Judgment and thought content normal.  Vitals reviewed.   Lab Results  Component Value Date   WBC 10.5 01/26/2017   HGB 14.8 01/26/2017   HCT 45.5 01/26/2017   PLT 225.0 01/26/2017   GLUCOSE 100 (H) 02/20/2017   CHOL 124 06/19/2016   TRIG 74 06/19/2016   HDL 32 (L) 06/19/2016   LDLDIRECT 168.0 10/20/2015   LDLCALC 77 06/19/2016   ALT 19 06/22/2016   AST 26 06/22/2016   NA 143 02/20/2017   K 3.5 02/20/2017   CL 100 02/20/2017   CREATININE 1.19 02/20/2017   BUN 20 02/20/2017   CO2 29 02/20/2017   TSH 2.10 10/24/2016   PSA 0.68 12/24/2015   INR 1.09 06/19/2016   HGBA1C 5.8 (H) 06/19/2016   MICROALBUR 1.1 12/12/2006    Ct Chest Wo Contrast  Result Date: 02/02/2017 CLINICAL DATA:  Cough with dyspnea. EXAM: CT CHEST WITHOUT CONTRAST TECHNIQUE: Multidetector CT imaging of the chest was performed following the standard protocol without IV contrast. COMPARISON:  06/13/2015 FINDINGS: Cardiovascular: The heart size is normal. No pericardial effusion. Coronary artery calcification is evident. Atherosclerotic calcification is noted in the wall of the thoracic aorta. Aberrant origin right subclavian artery again noted. Mediastinum/Nodes: Stable 2.1 cm right thyroid nodule. No mediastinal lymphadenopathy. No evidence for gross hilar lymphadenopathy although assessment is limited by the  lack of intravenous contrast on today's study. The esophagus has normal imaging features. There is no axillary lymphadenopathy. Lungs/Pleura: No focal airspace consolidation. No pulmonary edema or pleural effusion. No suspicious pulmonary nodule or mass. Upper Abdomen: Lap band noted in situ. No acute findings within the visualized upper abdomen. Musculoskeletal: Bone windows reveal no worrisome lytic or sclerotic osseous lesions. IMPRESSION: Stable. No new or acute interval findings. No features to explain the patient's history of cough with dyspnea. Electronically Signed   By: Misty Stanley M.D.   On: 02/02/2017 17:01    Assessment & Plan:   Alaric was seen today for atrial fibrillation and hypertension.  Diagnoses and all orders for this visit:  Benign essential HTN- His blood pressure is well controlled.  PAF (paroxysmal atrial fibrillation) ([place])- He is maintaining normal rate and rhythm.  Will continue anticoagulation with Eliquis.  Morbid obesity ([place])-  He is working on his lifestyle modifications to lose weight.  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  He will continue to follow-up with the other specialists regarding this.   I am having [name] Sayavong "[name]" maintain his Vitamin B-12, CALCIUM PO, DSS, loratadine, finasteride, ELIQUIS, atorvastatin, pantoprazole, rOPINIRole, torsemide, LORazepam, sertraline, montelukast, apixaban, flecainide, potassium chloride SA, and traMADol.  No orders of the defined types were placed in this encounter.    Follow-up: Return in about 6 months (around 10/22/2017).  [name], MD

## 2017-05-01 ENCOUNTER — Telehealth: Payer: Self-pay | Admitting: Internal Medicine

## 2017-05-01 ENCOUNTER — Encounter: Payer: Self-pay | Admitting: *Deleted

## 2017-05-01 DIAGNOSIS — R1313 Dysphagia, pharyngeal phase: Secondary | ICD-10-CM | POA: Diagnosis not present

## 2017-05-01 DIAGNOSIS — R633 Feeding difficulties: Secondary | ICD-10-CM | POA: Diagnosis not present

## 2017-05-01 DIAGNOSIS — R490 Dysphonia: Secondary | ICD-10-CM | POA: Diagnosis not present

## 2017-05-01 DIAGNOSIS — J3801 Paralysis of vocal cords and larynx, unilateral: Secondary | ICD-10-CM | POA: Diagnosis not present

## 2017-05-01 DIAGNOSIS — J3800 Paralysis of vocal cords and larynx, unspecified: Secondary | ICD-10-CM | POA: Diagnosis not present

## 2017-05-01 NOTE — Telephone Encounter (Signed)
New message   Patient calling to request letter/ assistance with getting Eliquis cheaper.  Patient calling the office for samples of medication:   1.  What medication and dosage are you requesting samples for?apixaban (ELIQUIS) 5 MG TABS tablet  2.  Are you currently out of this medication? no

## 2017-05-01 NOTE — Telephone Encounter (Signed)
Spoke with pt, aware samples at the front for pick up. He is asking for appeals letter for [employer] regarding his eliquis. Okay per dr Debara Pickett, letter from 2017 copied and faxed to [employer] @ [phone].

## 2017-05-01 NOTE — Telephone Encounter (Signed)
One box of eliquis samples available for patient pick up. Unable to reach pt or leave a message

## 2017-05-04 NOTE — Telephone Encounter (Signed)
Received fax from [facility] that Eliquis 5mg  is covered on his formulary and does not require a coverage determination request. The letter states the medication was filled on 05/01/17 and the next refill is 05/22/17. The approval timeframe is 05/01/17 - 04/02/18

## 2017-05-15 NOTE — Telephone Encounter (Signed)
Received letter from [facility] concerning Eliquis 5mg  - drug utilization review form (ticket # [id number]).   Request was denied: "The drug you are asking for is a brand product on Tier 3. Your formulary does not have any brand products on a lower tier to be eligible for lowering your copayment (tiering exception). Your copayment will remain the same.

## 2017-05-29 ENCOUNTER — Encounter: Payer: Medicare Other | Attending: Physical Medicine & Rehabilitation

## 2017-05-29 ENCOUNTER — Ambulatory Visit (HOSPITAL_BASED_OUTPATIENT_CLINIC_OR_DEPARTMENT_OTHER): Payer: Medicare Other | Admitting: Physical Medicine & Rehabilitation

## 2017-05-29 ENCOUNTER — Encounter: Payer: Self-pay | Admitting: Physical Medicine & Rehabilitation

## 2017-05-29 VITALS — BP 127/75 | HR 73 | Resp 14

## 2017-05-29 DIAGNOSIS — G463 Brain stem stroke syndrome: Secondary | ICD-10-CM | POA: Diagnosis not present

## 2017-05-29 DIAGNOSIS — I4891 Unspecified atrial fibrillation: Secondary | ICD-10-CM | POA: Insufficient documentation

## 2017-05-29 DIAGNOSIS — Z7901 Long term (current) use of anticoagulants: Secondary | ICD-10-CM | POA: Diagnosis not present

## 2017-05-29 DIAGNOSIS — I10 Essential (primary) hypertension: Secondary | ICD-10-CM | POA: Diagnosis not present

## 2017-05-29 DIAGNOSIS — G4733 Obstructive sleep apnea (adult) (pediatric): Secondary | ICD-10-CM | POA: Insufficient documentation

## 2017-05-29 DIAGNOSIS — M533 Sacrococcygeal disorders, not elsewhere classified: Secondary | ICD-10-CM

## 2017-05-29 DIAGNOSIS — M7541 Impingement syndrome of right shoulder: Secondary | ICD-10-CM

## 2017-05-29 NOTE — Patient Instructions (Signed)

## 2017-05-29 NOTE — Progress Notes (Signed)
Subjective:    Patient ID: [name], male    DOB: 12-24-1945, 72 y.o.   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  He completed inpatient rehab on 07/12/2016 and this was followed by outpatient rehabilitation. He has had good recovery from his stroke but still ambulates with an assistive device for balance.  He has had some improvement of his vocal volume after vocal fold injection with prolaryn plus.  Has some residual dysphagia with trace penetration and aspiration on recent modified barium swallow has not had any aspiration pneumonias.  Takes adequate amounts of fluid and calories p.o. Right-sided low back/sacroiliac pain has improved following injection on 04/19/2017. Right shoulder pain hurts at night, has had some prior history of shoulder pain but no prior shoulder surgery.  Has not undergone injections in the past.  He cannot exactly tell what causes his pain however he does feel the pain does increase with movement.  He has had no falls or trauma to that area recently.  Pain Inventory Average Pain 6 Pain Right Now 6 My pain is intermittent, sharp, tingling and aching  In the last 24 hours, has pain interfered with the following? General activity 5 Relation with others 1 Enjoyment of life 4 What TIME of day is your pain at its worst? daytime,night Sleep (in general) Good  Pain is worse with: walking, bending and standing Pain improves with: rest and medication Relief from Meds: 5  Mobility walk with assistance use a cane how many minutes can you walk? 5-10 ability to climb steps?  yes do you drive?  yes Do you have any goals in this area?  no  Function retired Do you have any goals in this area?  no  Neuro/Psych weakness trouble walking dizziness  Prior Studies Any changes since last visit?  no  Physicians involved in your care Any changes since last visit?   no   Family History  Problem Relation Age of Onset  . Depression Mother   . Hypertension Mother   . Dementia Mother   . Heart disease Father        MI  . Depression Brother   . Stroke Paternal Aunt        CVA  . Diabetes Paternal Uncle   . Heart disease Paternal Uncle        MI  . Heart disease Paternal Grandmother        MI  . Diabetes Cousin        PATERNAL   Social History   Socioeconomic History  . Marital status: Married    Spouse name: None  . Number of children: 1  . Years of education: None  . Highest education level: None  Social Needs  . Financial resource strain: None  . Food insecurity - worry: None  . Food insecurity - inability: None  . Transportation needs - medical: None  . Transportation needs - non-medical: None  Occupational History  . Occupation: retired-Manager [employer]  Tobacco Use  . Smoking status: Former Smoker    Packs/day: 0.20    Years: 13.00    Pack years: 2.60    Types: Cigarettes    Start date: 04/03/1961    Last attempt to quit: 08/30/1974    Years since quitting: 42.7  . Smokeless tobacco: Never Used  Substance and Sexual Activity  . Alcohol use: No    Alcohol/week: 0.0 oz  .  Drug use: No  . Sexual activity: Not Currently  Other Topics Concern  . None  Social History Narrative   Lives at home w/ his wife   Right-handed   Caffeine: decaf coffee, occasional tea   Past Surgical History:  Procedure Laterality Date  . ARTHROSCOPY KNEE W/ DRILLING    . BREATH TEK H PYLORI N/A 12/23/2012   Procedure: BREATH TEK H PYLORI;  Surgeon: Gayland Curry, MD;  Location: [facility];  Service: General;  Laterality: N/A;  . CHOLECYSTECTOMY  1989  . COLONOSCOPY N/A 07/31/2013   Procedure: COLONOSCOPY;  Surgeon: Gatha Mayer, MD;  Location: [place];  Service: Endoscopy;  Laterality: N/A;  . ESOPHAGOGASTRODUODENOSCOPY N/A 07/22/2013   Procedure: ESOPHAGOGASTRODUODENOSCOPY (EGD);  Surgeon: Irene Shipper, MD;  Location: [facility] ENDOSCOPY;   Service: Endoscopy;  Laterality: N/A;  . ESOPHAGOGASTRODUODENOSCOPY N/A 07/31/2013   Procedure: ESOPHAGOGASTRODUODENOSCOPY (EGD);  Surgeon: Gatha Mayer, MD;  Location: [facility] ENDOSCOPY;  Service: Endoscopy;  Laterality: N/A;  . INTRAOPERATIVE TRANSESOPHAGEAL ECHOCARDIOGRAM N/A 07/18/2013   Procedure: INTRAOPERATIVE TRANSESOPHAGEAL ECHOCARDIOGRAM;  Surgeon: Grace Isaac, MD;  Location: [place];  Service: Open Heart Surgery;  Laterality: N/A;  . LAPAROSCOPIC GASTRIC BANDING WITH HIATAL HERNIA REPAIR N/A 04/08/2013   Procedure: LAPAROSCOPIC GASTRIC BANDING WITH possible  HIATAL HERNIA REPAIR;  Surgeon: Gayland Curry, MD;  Location: [facility];  Service: General;  Laterality: N/A;  . LITHOTRIPSY    . renal calculi    . SUBXYPHOID PERICARDIAL WINDOW N/A 07/18/2013   Procedure: SUBXYPHOID PERICARDIAL WINDOW;  Surgeon: Grace Isaac, MD;  Location: [place];  Service: Thoracic;  Laterality: N/A;  . TONSILLECTOMY    . total knee replacment     bilat  . trigger finger repair     also lue, cts surgically repaired  . WISDOM TOOTH EXTRACTION     Past Medical History:  Diagnosis Date  . Anemia 07/29/2013  . Asthma    childhood asthma  . Benign prostatic hypertrophy    several prostate biopsies neg for cancer.   . Bilateral hip pain   . Cancer ([place])   . CHF (congestive heart failure) ([place])   . Complication of anesthesia    MORPHINE CAUSES HALLUCINATIONS  . CVA (cerebral vascular accident) ([place]) 06/2016  . Depression   . FH: colonic polyps 2010  . Gout   . History of kidney stones   . Hypertension   . Kidney cysts   . Morbid obesity ([place])   . Numbness    feet / legs  . PAF (paroxysmal atrial fibrillation) with RVR 07/29/2013  . Sleep apnea    USES C PAP   BP 127/75 (BP Location: Left Arm, Patient Position: Sitting, Cuff Size: Normal)   Pulse 73   Resp 14   SpO2 91%   Opioid Risk Score:   Fall Risk Score:  `1  Depression screen PHQ 2/9  Depression screen [facility] 2/9 07/28/2016 02/16/2015  03/30/2014  Decreased Interest 0 0 0  Down, Depressed, Hopeless 0 0 0  PHQ - 2 Score 0 0 0  Altered sleeping 0 - -  Tired, decreased energy 1 - -  Change in appetite 0 - -  Feeling bad or failure about yourself  0 - -  Trouble concentrating 1 - -  Moving slowly or fidgety/restless 0 - -  PHQ-9 Score 2 - -  Difficult doing work/chores Not difficult at all - -  Some recent data might be hidden    Review of Systems  HENT: Negative.   Eyes: Negative.   Respiratory: Positive for apnea, shortness of breath and wheezing.   Cardiovascular: Positive for leg swelling.  Gastrointestinal: Positive for constipation.  Endocrine: Negative.   Genitourinary: Negative.   Musculoskeletal: Positive for arthralgias, back pain and gait problem.  Skin: Negative.   Allergic/Immunologic: Negative.   Neurological: Positive for dizziness and weakness.  All other systems reviewed and are negative.      Objective:   Physical Exam  Constitutional: He is oriented to person, place, and time. He appears well-developed and well-nourished. No distress.  HENT:  Head: Normocephalic and atraumatic.  Mild hoarseness  Eyes: Conjunctivae and EOM are normal. Pupils are equal, round, and reactive to light.  Musculoskeletal:  Positive impingement sign right shoulder  Neurological: He is alert and oriented to person, place, and time. He has normal strength. Gait abnormal.  Motor strength is 5/5 bilateral deltoid bicep tricep grip hip flexor knee extensor ankle dorsiflexor Wide-based gait  Skin: He is not diaphoretic.  Psychiatric: He has a normal mood and affect.  Nursing note and vitals reviewed.         Assessment & Plan:  1.  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  No further PT OT needed at this point. 2.  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   Fortunately he has had good results with the right sacroiliac injection under fluoroscopic guidance performed on 04/19/2017, this can be repeated if and when pain recurs 3.  Right shoulder pain subacromial slightly represents an impingement syndrome versus a partial rotator cuff tear.  His examination is not clear-cut.  I have given him exercises to strengthen the parascapular musculature.  If this fails to relieve his pain he may be a good candidate for subacromial bursa injection since he is not a good candidate for nonsteroidal anti-inflammatories secondary to stroke history

## 2017-05-31 ENCOUNTER — Encounter (HOSPITAL_COMMUNITY): Payer: Self-pay | Admitting: Psychiatry

## 2017-05-31 ENCOUNTER — Ambulatory Visit (INDEPENDENT_AMBULATORY_CARE_PROVIDER_SITE_OTHER): Payer: Medicare Other | Admitting: Psychiatry

## 2017-05-31 DIAGNOSIS — F419 Anxiety disorder, unspecified: Secondary | ICD-10-CM | POA: Diagnosis not present

## 2017-05-31 DIAGNOSIS — Z818 Family history of other mental and behavioral disorders: Secondary | ICD-10-CM | POA: Diagnosis not present

## 2017-05-31 DIAGNOSIS — Z87891 Personal history of nicotine dependence: Secondary | ICD-10-CM | POA: Diagnosis not present

## 2017-05-31 DIAGNOSIS — Z636 Dependent relative needing care at home: Secondary | ICD-10-CM

## 2017-05-31 DIAGNOSIS — G463 Brain stem stroke syndrome: Secondary | ICD-10-CM | POA: Diagnosis not present

## 2017-05-31 DIAGNOSIS — F330 Major depressive disorder, recurrent, mild: Secondary | ICD-10-CM | POA: Diagnosis not present

## 2017-05-31 MED ORDER — LORAZEPAM 0.5 MG PO TABS: 0.5000 mg | ORAL_TABLET | Freq: Every day | ORAL | 2 refills | Status: DC

## 2017-05-31 MED ORDER — SERTRALINE HCL 100 MG PO TABS: 150.0000 mg | ORAL_TABLET | Freq: Every day | ORAL | 2 refills | Status: DC

## 2017-05-31 NOTE — Progress Notes (Deleted)
BH MD/PA/NP OP Progress Note  05/31/2017 2:29 PM [name]  MRN:  [id number]  Chief Complaint: I am doing better on my medication.  HPI: Patient came for his follow-up appointment.  He has been busy seeing physicians and doctors and recently saw his cardiologist.  He also have procedure for his vocal cord paralysis at [facility] and he endorsed his voice is almost back to normal.  He admitted sometimes sleeping on and off.  He is taking Ativan and also using CPAP machine.  He admitted a lot of physical symptoms including feeling tiredness, lethargy, generalized weakness, tingling, neuropathy and chronic pain.  But he denies any irritability, anger, mania or any psychosis.  Today he is coming from nursing home where his 68 year old mother is admitted who is been deteriorating gradually.  Patient told he had a family meeting which was very stressful.  Patient has no tremors shakes or any EPS.  He denies any suicidal thoughts or homicidal thought.  Celexa continue Zoloft and Ativan.  He denies drinking alcohol or using any illegal substances.    Visit Diagnosis:    ICD-10-CM   1. Major depressive disorder, recurrent episode, mild (HCC) F33.0 LORazepam (ATIVAN) 0.5 MG tablet    sertraline (ZOLOFT) 100 MG tablet    Past Psychiatric History: Viewed. Patient has one psychiatric admission in 2006 due to increased depression and having suicidal thoughts. He was taking Risperdal but it was discontinued after the stroke in March 2018.  Past Medical History:  Past Medical History:  Diagnosis Date  . Anemia 07/29/2013  . Asthma    childhood asthma  . Benign prostatic hypertrophy    several prostate biopsies neg for cancer.   . Bilateral hip pain   . Cancer ([place])   . CHF (congestive heart failure) ([place])   . Complication of anesthesia    MORPHINE CAUSES HALLUCINATIONS  . CVA (cerebral vascular accident) ([place]) 06/2016  . Depression   . FH: colonic polyps 2010  . Gout   . History of  kidney stones   . Hypertension   . Kidney cysts   . Morbid obesity ([place])   . Numbness    feet / legs  . PAF (paroxysmal atrial fibrillation) with RVR 07/29/2013  . Sleep apnea    USES C PAP    Past Surgical History:  Procedure Laterality Date  . ARTHROSCOPY KNEE W/ DRILLING    . BREATH TEK H PYLORI N/A 12/23/2012   Procedure: BREATH TEK H PYLORI;  Surgeon: Gayland Curry, MD;  Location: [facility];  Service: General;  Laterality: N/A;  . CHOLECYSTECTOMY  1989  . COLONOSCOPY N/A 07/31/2013   Procedure: COLONOSCOPY;  Surgeon: Gatha Mayer, MD;  Location: [place];  Service: Endoscopy;  Laterality: N/A;  . ESOPHAGOGASTRODUODENOSCOPY N/A 07/22/2013   Procedure: ESOPHAGOGASTRODUODENOSCOPY (EGD);  Surgeon: Irene Shipper, MD;  Location: [facility] ENDOSCOPY;  Service: Endoscopy;  Laterality: N/A;  . ESOPHAGOGASTRODUODENOSCOPY N/A 07/31/2013   Procedure: ESOPHAGOGASTRODUODENOSCOPY (EGD);  Surgeon: Gatha Mayer, MD;  Location: [facility] ENDOSCOPY;  Service: Endoscopy;  Laterality: N/A;  . INTRAOPERATIVE TRANSESOPHAGEAL ECHOCARDIOGRAM N/A 07/18/2013   Procedure: INTRAOPERATIVE TRANSESOPHAGEAL ECHOCARDIOGRAM;  Surgeon: Grace Isaac, MD;  Location: [place];  Service: Open Heart Surgery;  Laterality: N/A;  . LAPAROSCOPIC GASTRIC BANDING WITH HIATAL HERNIA REPAIR N/A 04/08/2013   Procedure: LAPAROSCOPIC GASTRIC BANDING WITH possible  HIATAL HERNIA REPAIR;  Surgeon: Gayland Curry, MD;  Location: [facility];  Service: General;  Laterality: N/A;  . LITHOTRIPSY    .  renal calculi    . SUBXYPHOID PERICARDIAL WINDOW N/A 07/18/2013   Procedure: SUBXYPHOID PERICARDIAL WINDOW;  Surgeon: Grace Isaac, MD;  Location: [place];  Service: Thoracic;  Laterality: N/A;  . TONSILLECTOMY    . total knee replacment     bilat  . trigger finger repair     also lue, cts surgically repaired  . WISDOM TOOTH EXTRACTION      Family Psychiatric History: Reviewed.  Family History:  Family History  Problem Relation Age of Onset  .  Depression Mother   . Hypertension Mother   . Dementia Mother   . Heart disease Father        MI  . Depression Brother   . Stroke Paternal Aunt        CVA  . Diabetes Paternal Uncle   . Heart disease Paternal Uncle        MI  . Heart disease Paternal Grandmother        MI  . Diabetes Cousin        PATERNAL    Social History:  Social History   Socioeconomic History  . Marital status: Married    Spouse name: Not on file  . Number of children: 1  . Years of education: Not on file  . Highest education level: Not on file  Social Needs  . Financial resource strain: Not on file  . Food insecurity - worry: Not on file  . Food insecurity - inability: Not on file  . Transportation needs - medical: Not on file  . Transportation needs - non-medical: Not on file  Occupational History  . Occupation: retired-Manager [employer]  Tobacco Use  . Smoking status: Former Smoker    Packs/day: 0.20    Years: 13.00    Pack years: 2.60    Types: Cigarettes    Start date: 04/03/1961    Last attempt to quit: 08/30/1974    Years since quitting: 42.7  . Smokeless tobacco: Never Used  Substance and Sexual Activity  . Alcohol use: No    Alcohol/week: 0.0 oz  . Drug use: No  . Sexual activity: Not Currently  Other Topics Concern  . Not on file  Social History Narrative   Lives at home w/ his wife   Right-handed   Caffeine: decaf coffee, occasional tea    Allergies:  Allergies  Allergen Reactions  . Morphine Other (See Comments)    Nausea and Severe hallucinations  . Codeine Nausea Only  . Penicillins Other (See Comments)    LOC with shot  . Propoxyphene N-Acetaminophen Nausea Only    Metabolic Disorder Labs: Lab Results  Component Value Date   HGBA1C 5.8 (H) 06/19/2016   MPG 120 06/19/2016   MPG 128 (H) 12/05/2012   No results found for: PROLACTIN Lab Results  Component Value Date   CHOL 124 06/19/2016   TRIG 74 06/19/2016   HDL 32 (L) 06/19/2016   CHOLHDL 3.9 06/19/2016    VLDL 15 06/19/2016   LDLCALC 77 06/19/2016   LDLCALC 81 04/26/2016   Lab Results  Component Value Date   TSH 2.10 10/24/2016   TSH 2.68 10/20/2015    Therapeutic Level Labs: No results found for: LITHIUM No results found for: VALPROATE No components found for:  CBMZ  Current Medications: Current Outpatient Medications  Medication Sig Dispense Refill  . apixaban (ELIQUIS) 5 MG TABS tablet     . atorvastatin (LIPITOR) 80 MG tablet TAKE 1 TABLET EVERY DAY 90 tablet 1  .  CALCIUM PO Take 500 mg by mouth 2 (two) times daily.     . Cyanocobalamin (VITAMIN B-12) 1000 MCG SUBL Place 5,000 mcg under the tongue every evening.     [name] Sodium (DSS) 100 MG CAPS Take 100 mg by mouth every other day.    [name] ELIQUIS 5 MG TABS tablet TAKE 1 TABLET BY MOUTH TWICE DAILY 180 tablet 0  . finasteride (PROSCAR) 5 MG tablet Take 1 tablet (5 mg total) by mouth daily. 30 tablet 1  . flecainide (TAMBOCOR) 50 MG tablet TAKE 1 TABLET BY MOUTH EVERY 12 HOURS 180 tablet 0  . loratadine (CLARITIN) 10 MG tablet Take 10 mg by mouth daily.    [name] LORazepam (ATIVAN) 0.5 MG tablet Take 1 tablet (0.5 mg total) by mouth at bedtime. 30 tablet 2  . montelukast (SINGULAIR) 10 MG tablet TAKE 1 TABLET BY MOUTH EVERYDAY AT BEDTIME 30 tablet 5  . pantoprazole (PROTONIX) 40 MG tablet TAKE 1 TABLET BY MOUTH EVERY DAY 90 tablet 1  . potassium chloride SA (KLOR-CON M20) 20 MEQ tablet Take 2 tablets (40 mEq total) by mouth daily. 180 tablet 1  . rOPINIRole (REQUIP) 0.5 MG tablet TAKE 1 TABLET AT BEDTIME 90 tablet 1  . sertraline (ZOLOFT) 100 MG tablet Take 1.5 tablets (150 mg total) by mouth daily. 45 tablet 2  . torsemide (DEMADEX) 20 MG tablet Take 1 tablet (20 mg total) daily by mouth. 30 tablet 3  . traMADol (ULTRAM) 50 MG tablet Take 1 tablet (50 mg total) by mouth 2 (two) times daily. 60 tablet 0   No current facility-administered medications for this visit.      Musculoskeletal: Strength & Muscle Tone:  decreased Gait & Station: walk with assistance Patient leans: Right  Psychiatric Specialty Exam: Review of Systems  Respiratory: Positive for shortness of breath and wheezing.   Musculoskeletal: Positive for back pain.  Neurological: Positive for tingling, focal weakness and weakness.    Blood pressure 124/76, pulse 78, height 6\' 1"  (1.854 m), weight (!) 360 lb (163.3 kg).There is no height or weight on file to calculate BMI.  General Appearance: Casual  Eye Contact:  Good  Speech:  Slow  Volume:  Normal  Mood:  Anxious  Affect:  Appropriate  Thought Process:  Goal Directed  Orientation:  Full (Time, Place, and Person)  Thought Content: Rumination   Suicidal Thoughts:  No  Homicidal Thoughts:  No  Memory:  Immediate;   Good Recent;   Good Remote;   Good  Judgement:  Good  Insight:  Good  Psychomotor Activity:  Decreased  Concentration:  Concentration: Fair and Attention Span: Fair  Recall:  [place] of Knowledge: Good  Language: Good  Akathisia:  No  Handed:  Right  AIMS (if indicated): not done  Assets:  Communication Skills Desire for Improvement Housing Resilience Social Support  ADL's:  Intact  Cognition: WNL  Sleep:  Fair   Screenings: PHQ2-9     Office Visit from 07/28/2016 in Dr. Alysia [facility]16/[facility] Office Visit from 03/30/2014 in [place] from 03/07/2013 in [place] at  [employer] Total Score  0  0  0  0  PHQ-9 Total Score  2  No data  No data  No data       Assessment and Plan: Major depressive disorder, recurrent.  Anxiety disorder NOS.  Patient is a stable on his  current psychiatric medication.  Reassurance given.  Continue Zoloft 150 mg daily and lorazepam 0.5 mg at bedtime.  Patient has no concerns including tremors shakes or any EPS.  Patient is not interested in counseling.  Recommended to call us back if  is any question or any concern.  Follow-up in 3 months   Kathlee Nations, MD 05/31/2017, 2:29 PM

## 2017-05-31 NOTE — Progress Notes (Signed)
[place] MD/PA/NP OP Progress Note  05/31/2017 4:38 PM [name]  MRN:  [id number]  Chief Complaint: I am doing better on medication.  HPI: Patient came for his follow-up appointment.  He is been seeing physician and busy with doctor's appointment.  Recently he see a cardiologist.  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  He admitted sometimes sleeping on and off.  He takes Ativan and also using CPAP machine.  He admitted a lot of physical symptoms including feeling tired, lethargy, generalized weakness, tingling, neuropathy and chronic pain.  However he denies any irritability, anger, mania or any psychosis.  Today he is coming from nursing home where his 72 year old mother is admitted who has been deteriorating gradually.  Patient has a family meeting which was very stressful.  Patient denies any tremors, shakes or any EPS.  He denies any suicidal thoughts or homicidal thought.  He like to continue Celexa, Zoloft and Ativan.  He denies drinking alcohol or using any illegal substances.  Visit Diagnosis:    ICD-10-CM   1. Major depressive disorder, recurrent episode, mild (HCC) F33.0 LORazepam (ATIVAN) 0.5 MG tablet    sertraline (ZOLOFT) 100 MG tablet    Past Psychiatric History: Reviewed. Patient has one psychiatric admission in 2006 due to increased depression and having suicidal thoughts. He was taking Risperdal but it was discontinued after the stroke in March 2018.  Past Medical History:  Past Medical History:  Diagnosis Date  . Anemia 07/29/2013  . Asthma    childhood asthma  . Benign prostatic hypertrophy    several prostate biopsies neg for cancer.   . Bilateral hip pain   . Cancer ([place])   . CHF (congestive heart failure) ([place])   . Complication of anesthesia    MORPHINE CAUSES HALLUCINATIONS  . CVA (cerebral vascular accident) ([place]) 06/2016  . Depression   . FH: colonic  polyps 2010  . Gout   . History of kidney stones   . Hypertension   . Kidney cysts   . Morbid obesity ([place])   . Numbness    feet / legs  . PAF (paroxysmal atrial fibrillation) with RVR 07/29/2013  . Sleep apnea    USES C PAP    Past Surgical History:  Procedure Laterality Date  . ARTHROSCOPY KNEE W/ DRILLING    . BREATH TEK H PYLORI N/A 12/23/2012   Procedure: BREATH TEK H PYLORI;  Surgeon: Gayland Curry, MD;  Location: [facility];  Service: General;  Laterality: N/A;  . CHOLECYSTECTOMY  1989  . COLONOSCOPY N/A 07/31/2013   Procedure: COLONOSCOPY;  Surgeon: Gatha Mayer, MD;  Location: [place];  Service: Endoscopy;  Laterality: N/A;  . ESOPHAGOGASTRODUODENOSCOPY N/A 07/22/2013   Procedure: ESOPHAGOGASTRODUODENOSCOPY (EGD);  Surgeon: Irene Shipper, MD;  Location: [facility] ENDOSCOPY;  Service: Endoscopy;  Laterality: N/A;  . ESOPHAGOGASTRODUODENOSCOPY N/A 07/31/2013   Procedure: ESOPHAGOGASTRODUODENOSCOPY (EGD);  Surgeon: Gatha Mayer, MD;  Location: [facility] ENDOSCOPY;  Service: Endoscopy;  Laterality: N/A;  . INTRAOPERATIVE TRANSESOPHAGEAL ECHOCARDIOGRAM N/A 07/18/2013   Procedure: INTRAOPERATIVE TRANSESOPHAGEAL ECHOCARDIOGRAM;  Surgeon: Grace Isaac, MD;  Location: [place];  Service: Open Heart Surgery;  Laterality: N/A;  . LAPAROSCOPIC GASTRIC BANDING WITH HIATAL HERNIA REPAIR N/A 04/08/2013   Procedure: LAPAROSCOPIC GASTRIC BANDING WITH possible  HIATAL HERNIA REPAIR;  Surgeon: Gayland Curry, MD;  Location: [facility];  Service: General;  Laterality: N/A;  .  LITHOTRIPSY    . renal calculi    . SUBXYPHOID PERICARDIAL WINDOW N/A 07/18/2013   Procedure: SUBXYPHOID PERICARDIAL WINDOW;  Surgeon: Grace Isaac, MD;  Location: [place];  Service: Thoracic;  Laterality: N/A;  . TONSILLECTOMY    . total knee replacment     bilat  . trigger finger repair     also lue, cts surgically repaired  . WISDOM TOOTH EXTRACTION      Family Psychiatric History: Reviewed.  Family History:  Family  History  Problem Relation Age of Onset  . Depression Mother   . Hypertension Mother   . Dementia Mother   . Heart disease Father        MI  . Depression Brother   . Stroke Paternal Aunt        CVA  . Diabetes Paternal Uncle   . Heart disease Paternal Uncle        MI  . Heart disease Paternal Grandmother        MI  . Diabetes Cousin        PATERNAL    Social History:  Social History   Socioeconomic History  . Marital status: Married    Spouse name: None  . Number of children: 1  . Years of education: None  . Highest education level: None  Social Needs  . Financial resource strain: None  . Food insecurity - worry: None  . Food insecurity - inability: None  . Transportation needs - medical: None  . Transportation needs - non-medical: None  Occupational History  . Occupation: retired-Manager [employer]  Tobacco Use  . Smoking status: Former Smoker    Packs/day: 0.20    Years: 13.00    Pack years: 2.60    Types: Cigarettes    Start date: 04/03/1961    Last attempt to quit: 08/30/1974    Years since quitting: 42.7  . Smokeless tobacco: Never Used  Substance and Sexual Activity  . Alcohol use: No    Alcohol/week: 0.0 oz  . Drug use: No  . Sexual activity: Not Currently  Other Topics Concern  . None  Social History Narrative   Lives at home w/ his wife   Right-handed   Caffeine: decaf coffee, occasional tea    Allergies:  Allergies  Allergen Reactions  . Morphine Other (See Comments)    Nausea and Severe hallucinations  . Codeine Nausea Only  . Penicillins Other (See Comments)    LOC with shot  . Propoxyphene N-Acetaminophen Nausea Only    Metabolic Disorder Labs: Lab Results  Component Value Date   HGBA1C 5.8 (H) 06/19/2016   MPG 120 06/19/2016   MPG 128 (H) 12/05/2012   No results found for: PROLACTIN Lab Results  Component Value Date   CHOL 124 06/19/2016   TRIG 74 06/19/2016   HDL 32 (L) 06/19/2016   CHOLHDL 3.9 06/19/2016   VLDL 15  06/19/2016   LDLCALC 77 06/19/2016   LDLCALC 81 04/26/2016   Lab Results  Component Value Date   TSH 2.10 10/24/2016   TSH 2.68 10/20/2015    Therapeutic Level Labs: No results found for: LITHIUM No results found for: VALPROATE No components found for:  CBMZ  Current Medications: Current Outpatient Medications  Medication Sig Dispense Refill  . apixaban (ELIQUIS) 5 MG TABS tablet     . atorvastatin (LIPITOR) 80 MG tablet TAKE 1 TABLET EVERY DAY 90 tablet 1  . CALCIUM PO Take 500 mg by mouth 2 (two) times daily.     [name]  Cyanocobalamin (VITAMIN B-12) 1000 MCG SUBL Place 5,000 mcg under the tongue every evening.     [name] Sodium (DSS) 100 MG CAPS Take 100 mg by mouth every other day.    [name] ELIQUIS 5 MG TABS tablet TAKE 1 TABLET BY MOUTH TWICE DAILY 180 tablet 0  . finasteride (PROSCAR) 5 MG tablet Take 1 tablet (5 mg total) by mouth daily. 30 tablet 1  . flecainide (TAMBOCOR) 50 MG tablet TAKE 1 TABLET BY MOUTH EVERY 12 HOURS 180 tablet 0  . loratadine (CLARITIN) 10 MG tablet Take 10 mg by mouth daily.    [name] LORazepam (ATIVAN) 0.5 MG tablet Take 1 tablet (0.5 mg total) by mouth at bedtime. 30 tablet 2  . montelukast (SINGULAIR) 10 MG tablet TAKE 1 TABLET BY MOUTH EVERYDAY AT BEDTIME 30 tablet 5  . pantoprazole (PROTONIX) 40 MG tablet TAKE 1 TABLET BY MOUTH EVERY DAY 90 tablet 1  . potassium chloride SA (KLOR-CON M20) 20 MEQ tablet Take 2 tablets (40 mEq total) by mouth daily. 180 tablet 1  . rOPINIRole (REQUIP) 0.5 MG tablet TAKE 1 TABLET AT BEDTIME 90 tablet 1  . sertraline (ZOLOFT) 100 MG tablet Take 1.5 tablets (150 mg total) by mouth daily. 45 tablet 2  . torsemide (DEMADEX) 20 MG tablet Take 1 tablet (20 mg total) daily by mouth. 30 tablet 3  . traMADol (ULTRAM) 50 MG tablet Take 1 tablet (50 mg total) by mouth 2 (two) times daily. 60 tablet 0   No current facility-administered medications for this visit.      Musculoskeletal: Strength & Muscle Tone: decreased Gait &  Station: Walking with the cane Patient leans: Right  Psychiatric Specialty Exam: ROS  Blood pressure 124/76, pulse 78, height 6\' 1"  (1.854 m), weight (!) 360 lb (163.3 kg).Body mass index is 47.5 kg/m.  General Appearance: Casual  Eye Contact:  Good  Speech:  Slow  Volume:  Normal  Mood:  Anxious  Affect:  Appropriate  Thought Process:  Goal Directed  Orientation:  Full (Time, Place, and Person)  Thought Content: Logical and Rumination   Suicidal Thoughts:  No  Homicidal Thoughts:  No  Memory:  Immediate;   Good Recent;   Good Remote;   Good  Judgement:  Good  Insight:  Good  Psychomotor Activity:  Decreased  Concentration:  Concentration: Fair and Attention Span: Fair  Recall:  [place] of Knowledge: Good  Language: Good  Akathisia:  No  Handed:  Right  AIMS (if indicated): not done  Assets:  Communication Skills Desire for Improvement Housing Talents/Skills  ADL's:  Intact  Cognition: WNL  Sleep:  Fair   Screenings: PHQ2-9     Office Visit from 07/28/2016 in Dr. Alysia Penna[facility] Office Visit from 03/30/2014 in [facility] from 03/07/2013 in [place] at  [employer] Total Score  0  0  0  0  PHQ-9 Total Score  2  No data  No data  No data       Assessment and Plan: Major depressive disorder, recurrent.  Anxiety disorder NOS.  Patient is a stable on his current psychiatric medication.  He wants to continue his current psychiatric medication.  Continue Zoloft 150 mg daily, lorazepam 0.5 mg at bedtime.  Discussed medication side effects and benefits.  Patient is not interested in counseling.  Recommended to call us back if is any question or any  concern.  Follow-up in 3 months.   Kathlee Nations, MD 05/31/2017, 4:38 PM

## 2017-06-04 ENCOUNTER — Other Ambulatory Visit: Payer: Self-pay | Admitting: Acute Care

## 2017-06-13 ENCOUNTER — Other Ambulatory Visit (HOSPITAL_COMMUNITY): Payer: Self-pay | Admitting: Psychiatry

## 2017-06-13 DIAGNOSIS — F330 Major depressive disorder, recurrent, mild: Secondary | ICD-10-CM

## 2017-06-26 ENCOUNTER — Encounter: Payer: Medicare Other | Attending: Physical Medicine & Rehabilitation

## 2017-06-26 ENCOUNTER — Encounter: Payer: Self-pay | Admitting: Physical Medicine & Rehabilitation

## 2017-06-26 ENCOUNTER — Ambulatory Visit (HOSPITAL_BASED_OUTPATIENT_CLINIC_OR_DEPARTMENT_OTHER): Payer: Medicare Other | Admitting: Physical Medicine & Rehabilitation

## 2017-06-26 VITALS — BP 138/74 | HR 72 | Ht 72.0 in | Wt 365.0 lb

## 2017-06-26 DIAGNOSIS — I10 Essential (primary) hypertension: Secondary | ICD-10-CM | POA: Diagnosis not present

## 2017-06-26 DIAGNOSIS — M533 Sacrococcygeal disorders, not elsewhere classified: Secondary | ICD-10-CM | POA: Diagnosis not present

## 2017-06-26 DIAGNOSIS — G4733 Obstructive sleep apnea (adult) (pediatric): Secondary | ICD-10-CM | POA: Insufficient documentation

## 2017-06-26 DIAGNOSIS — I4891 Unspecified atrial fibrillation: Secondary | ICD-10-CM | POA: Insufficient documentation

## 2017-06-26 DIAGNOSIS — Z7901 Long term (current) use of anticoagulants: Secondary | ICD-10-CM | POA: Diagnosis not present

## 2017-06-26 DIAGNOSIS — G463 Brain stem stroke syndrome: Secondary | ICD-10-CM | POA: Insufficient documentation

## 2017-06-26 NOTE — Progress Notes (Signed)
Right sacroiliac injection under fluoroscopic guidance  Indication: Right Low back and buttocks pain not relieved by medication management and other conservative care.  Informed consent was obtained after describing risks and benefits of the procedure with the patient, this includes bleeding, bruising, infection, paralysis and medication side effects. The patient wishes to proceed and has given written consent. The patient was placed in a prone position. The lumbar and sacral area was marked and prepped with Betadine. A 25-gauge 1-1/2 inch needle was inserted into the skin and subcutaneous tissue and 1 mL of 1% lidocaine was injected. Then a 22-gauge 5 inch spinal needle was inserted under fluoroscopic guidance into the Right sacroiliac joint. AP and lateral images were utilized. Omnipaque 180x0.5 mL under live fluoroscopy demonstrated no intravascular uptake. Then a solution containing one ML of 6 mgper ml betamethasone and 2 ML of 1% lidocaine MPF was injected x1.5 mL. Patient tolerated the procedure well. Post procedure instructions were given. Please see post procedure form.

## 2017-06-26 NOTE — Progress Notes (Signed)
  [facility]   Name: [name] DOB:1945-09-19 MRN: [id number]  Date:06/26/2017  Physician: Alysia Penna, MD    Nurse/CMA: Lakechia Nay CMA  Allergies:  Allergies  Allergen Reactions  . Morphine Other (See Comments)    Nausea and Severe hallucinations  . Codeine Nausea Only  . Penicillins Other (See Comments)    LOC with shot  . Propoxyphene N-Acetaminophen Nausea Only    Consent Signed: Yes.    Is patient diabetic? No.  CBG today? NA  Pregnant: No. LMP: No LMP for male patient. (age 72-55)  Anticoagulants: yes (eliquis = last dose this AM) Anti-inflammatory: no Antibiotics: no  Procedure: Right Sacroiliac INJ  Position: Prone   Start Time:  137pm     End Time:  143pm     Fluoro Time:  22s       RN/CMA Cherilynn Schomburg CMA Deundra Furber CMA    Time 116pm 150pm    BP 138/74 129/76    Pulse 72 71    Respirations 16 16    O2 Sat 94 97    S/S 6 6    Pain Level 6/10 4/10     D/C home with Wife, patient A & O X 3, D/C instructions reviewed, and sits independently.

## 2017-06-26 NOTE — Patient Instructions (Signed)
Sacroiliac injection was performed today. A combination of a naming medicine plus a cortisone medicine was injected. The injection was done under x-ray guidance. This procedure has been performed to help reduce low back and buttocks pain as well as potentially hip pain. The duration of this injection is variable lasting from hours to  Months. It may repeated if needed. 

## 2017-06-30 IMAGING — RF DG SWALLOWING FUNCTION - NRPT [facility]
13 series · 24 of 24 positions shown · non-contrast
Comparison: none

[Series 1: cp_standard · 0.34mm/px · 2 of 38 frames shown (1 of 13)]
[frame 6/38]
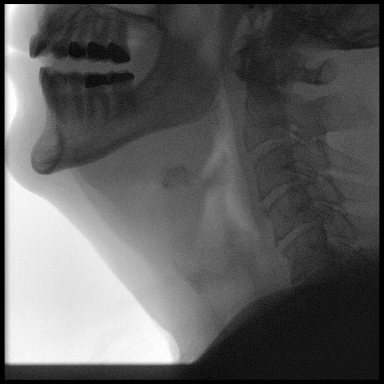
[frame 31/38]
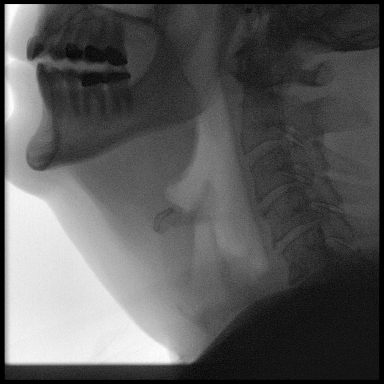

[Series 2: cp_standard · 0.34mm/px · 2 of 30 frames shown (2 of 13)]
[frame 5/30]
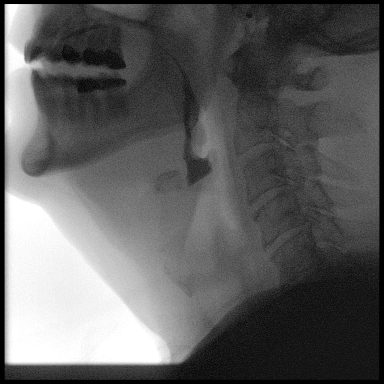
[frame 26/30]
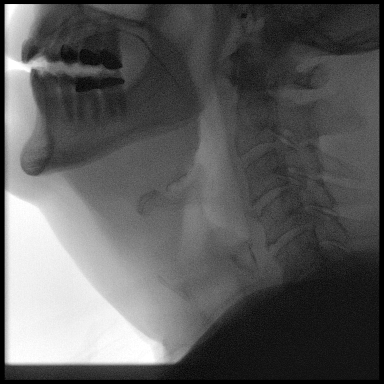

[Series 3: cp_standard · 0.34mm/px · 2 of 81 frames shown (3 of 13)]
[frame 13/81]
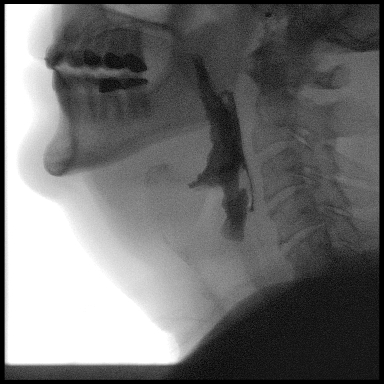
[frame 69/81]
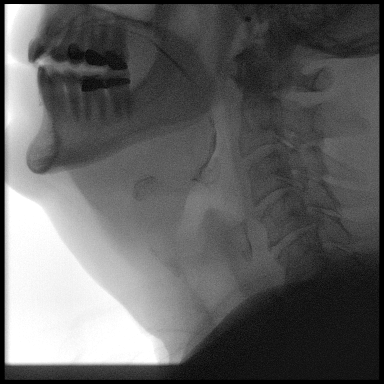

[Series 4: cp_standard · 0.34mm/px · 1 of 35 frames shown (4 of 13)]
[frame 6/35]
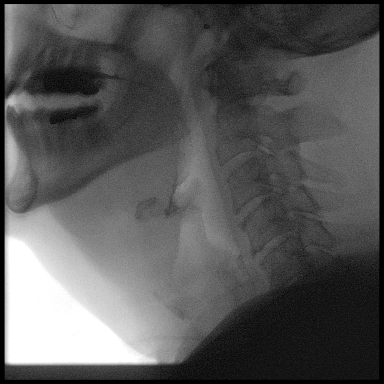

[Series 5: cp_standard · 0.34mm/px · 2 of 60 frames shown (5 of 13)]
[frame 10/60]
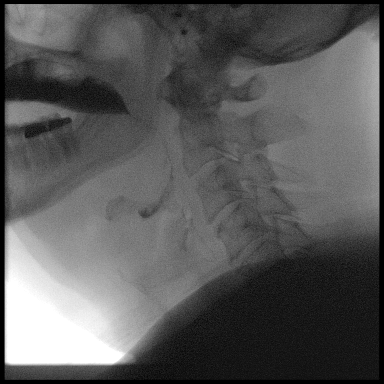
[frame 46/60]
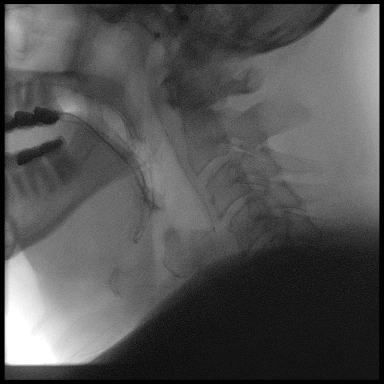

[Series 6: cp_standard · 0.34mm/px · 2 of 84 frames shown (6 of 13)]
[frame 13/84]
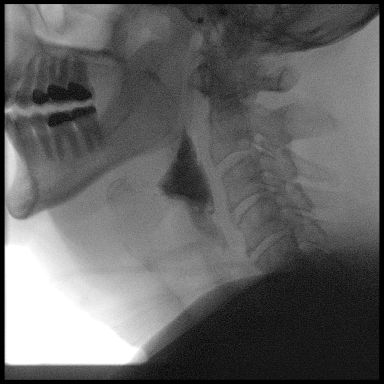
[frame 43/84]
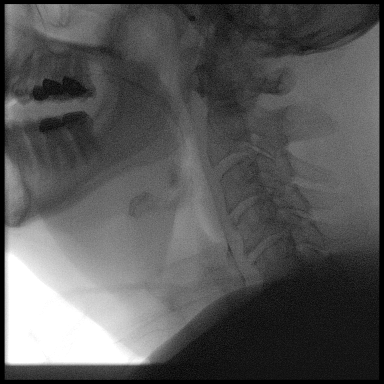

[Series 7: cp_standard · 0.34mm/px · 2 of 37 frames shown (7 of 13)]
[frame 6/37]
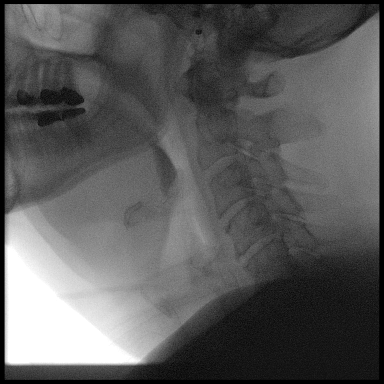
[frame 32/37]
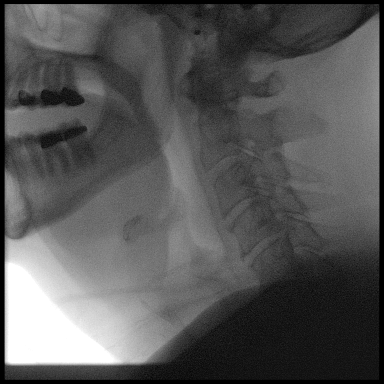

[Series 8: cp_standard · 0.34mm/px · 2 of 143 frames shown (8 of 13)]
[frame 72/143]
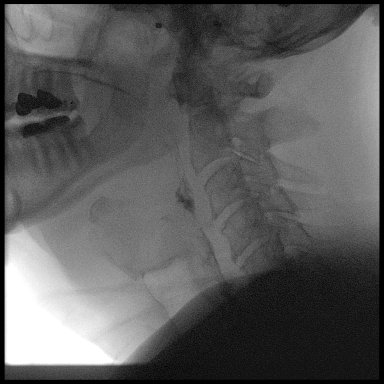
[frame 122/143]
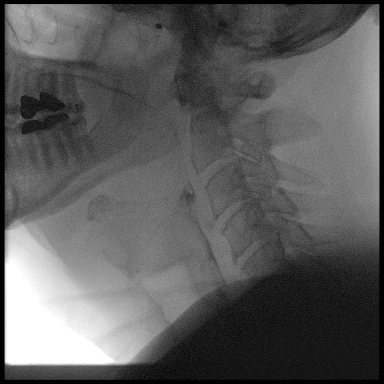

[Series 9: cp_standard · 0.34mm/px · 2 of 138 frames shown (9 of 13)]
[frame 70/138]
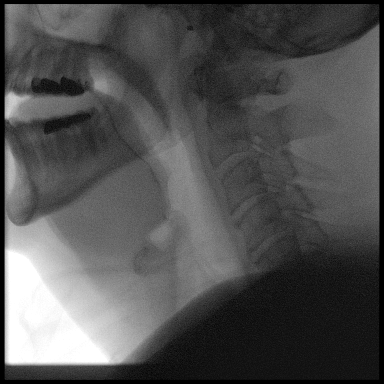
[frame 118/138]
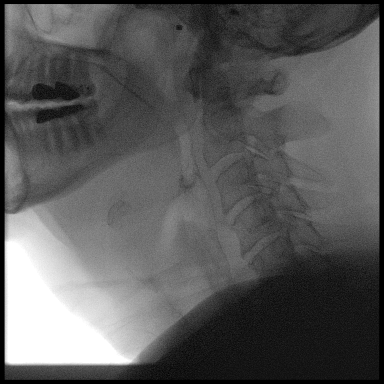

[Series 10: cp_standard · 0.34mm/px · 1 of 63 frames shown (10 of 13)]
[frame 32/63]
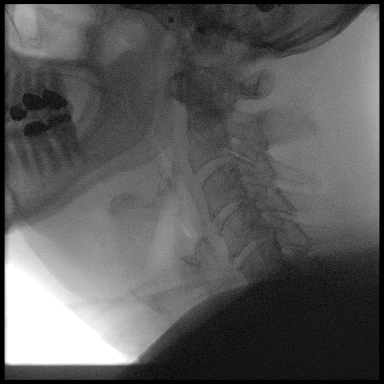

[Series 11: cp_standard · 0.34mm/px · 2 of 130 frames shown (11 of 13)]
[frame 20/130]
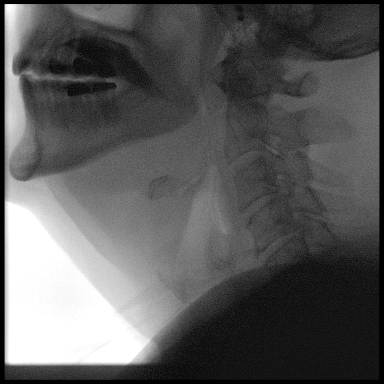
[frame 79/130]
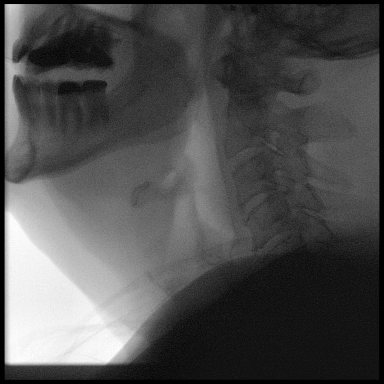

[Series 12: cp_standard · 0.34mm/px · 2 of 131 frames shown (12 of 13)]
[frame 4/131]
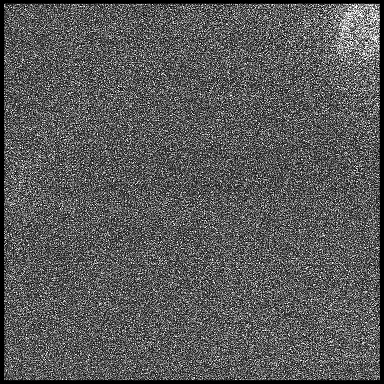
[frame 112/131]
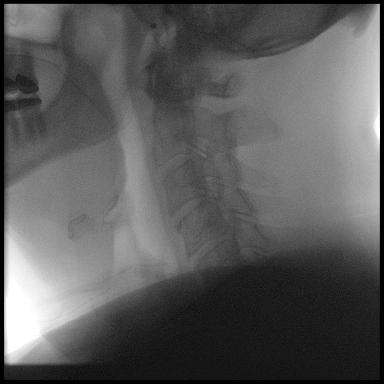

[Series 13: cp_standard · 0.34mm/px · 2 of 55 frames shown (13 of 13)]
[frame 9/55]
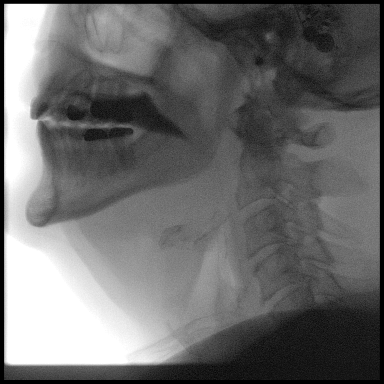
[frame 47/55]
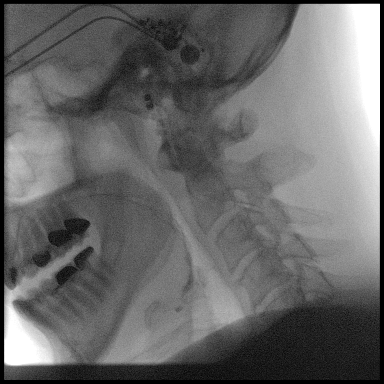

[24 of 24 positions shown; findings below may reference images not displayed]

FLUOROSCOPY FOR SWALLOWING FUNCTION STUDY:
Fluoroscopy was provided for swallowing function study, which was administered by a speech pathologist.  Final results and recommendations from this study are contained within the speech pathology report.

## 2017-07-03 ENCOUNTER — Other Ambulatory Visit: Payer: Self-pay | Admitting: Acute Care

## 2017-07-04 ENCOUNTER — Encounter: Payer: Self-pay | Admitting: Internal Medicine

## 2017-07-23 ENCOUNTER — Other Ambulatory Visit: Payer: Self-pay | Admitting: Internal Medicine

## 2017-07-27 ENCOUNTER — Other Ambulatory Visit: Payer: Self-pay | Admitting: Internal Medicine

## 2017-07-31 ENCOUNTER — Other Ambulatory Visit: Payer: Self-pay | Admitting: Acute Care

## 2017-08-01 ENCOUNTER — Other Ambulatory Visit: Payer: Self-pay | Admitting: Internal Medicine

## 2017-08-01 ENCOUNTER — Other Ambulatory Visit: Payer: Self-pay | Admitting: Acute Care

## 2017-08-01 DIAGNOSIS — E785 Hyperlipidemia, unspecified: Secondary | ICD-10-CM

## 2017-08-03 ENCOUNTER — Other Ambulatory Visit (HOSPITAL_COMMUNITY): Payer: Self-pay | Admitting: Psychiatry

## 2017-08-03 DIAGNOSIS — F330 Major depressive disorder, recurrent, mild: Secondary | ICD-10-CM

## 2017-08-19 ENCOUNTER — Other Ambulatory Visit: Payer: Self-pay | Admitting: Physical Medicine & Rehabilitation

## 2017-08-20 NOTE — Telephone Encounter (Signed)
Recieved electronic medication refill request for tramadol, no mention in previous notes of this medication, last time prescribed was 04-20-17 with no refills.  Please advise if a refill is ok for this patient.

## 2017-08-21 NOTE — Telephone Encounter (Signed)
The patient will need to come in for a controlled substance agreement opioid consent, urine drug screen he can see Zella Ball for this

## 2017-08-28 ENCOUNTER — Ambulatory Visit (INDEPENDENT_AMBULATORY_CARE_PROVIDER_SITE_OTHER): Payer: Medicare Other | Admitting: Psychiatry

## 2017-08-28 ENCOUNTER — Telehealth: Payer: Self-pay

## 2017-08-28 ENCOUNTER — Encounter (HOSPITAL_COMMUNITY): Payer: Self-pay | Admitting: Psychiatry

## 2017-08-28 DIAGNOSIS — Z818 Family history of other mental and behavioral disorders: Secondary | ICD-10-CM

## 2017-08-28 DIAGNOSIS — Z87891 Personal history of nicotine dependence: Secondary | ICD-10-CM | POA: Diagnosis not present

## 2017-08-28 DIAGNOSIS — G463 Brain stem stroke syndrome: Secondary | ICD-10-CM

## 2017-08-28 DIAGNOSIS — M549 Dorsalgia, unspecified: Secondary | ICD-10-CM | POA: Diagnosis not present

## 2017-08-28 DIAGNOSIS — M2550 Pain in unspecified joint: Secondary | ICD-10-CM | POA: Diagnosis not present

## 2017-08-28 DIAGNOSIS — Z810 Family history of intellectual disabilities: Secondary | ICD-10-CM | POA: Diagnosis not present

## 2017-08-28 DIAGNOSIS — F419 Anxiety disorder, unspecified: Secondary | ICD-10-CM

## 2017-08-28 DIAGNOSIS — F330 Major depressive disorder, recurrent, mild: Secondary | ICD-10-CM | POA: Diagnosis not present

## 2017-08-28 MED ORDER — TORSEMIDE 20 MG PO TABS: 20.0000 mg | ORAL_TABLET | Freq: Two times a day (BID) | ORAL | 0 refills | Status: DC

## 2017-08-28 MED ORDER — LORAZEPAM 0.5 MG PO TABS: 0.5000 mg | ORAL_TABLET | Freq: Every day | ORAL | 2 refills | Status: DC

## 2017-08-28 MED ORDER — SERTRALINE HCL 100 MG PO TABS: 150.0000 mg | ORAL_TABLET | Freq: Every day | ORAL | 2 refills | Status: DC

## 2017-08-28 NOTE — Progress Notes (Signed)
BH MD/PA/NP OP Progress Note  08/28/2017 1:37 PM [name]  MRN:  [id number]  Chief Complaint: I am doing better but I am concerned about weight gain.  I am easily tired and have shortness of breath.  HPI: Patient came for his follow-up appointment.  He is taking his medication and denies any side effects.  He reported his depression and anxiety is under control but he is very concerned about weight gain.  He gained 25 pound in the past few months.  He is not sure because he do not recall change in his eating.  He admitted easily tired and having shortness of breath.  He told that he has swelling in his leg but he also taking fluid pills.  He denies any irritability, anger, mania, psychosis.  He is also worried about his 72 year old mother who is in nursing home and deteriorating cognitive wise.  He does not recall and remember and recognize people.  He denies any paranoia, hallucination, suicidal thoughts or any crying spells.  His energy level is low.  Patient has multiple somatic complaints including lethargy, generalized weakness, tingling, neuropathy and chronic back pain.  He has no tremors shakes or any EPS.  He denies drinking or using any illegal substances.  Visit Diagnosis:    ICD-10-CM   1. Major depressive disorder, recurrent episode, mild (HCC) F33.0 sertraline (ZOLOFT) 100 MG tablet    LORazepam (ATIVAN) 0.5 MG tablet    Past Psychiatric History: Reviewed. Patient has one psychiatric admission in 2006 due to increased depression and having suicidal thoughts. He was taking Risperdal but it was discontinued after the stroke in March 2018.  Past Medical History:  Past Medical History:  Diagnosis Date  . Anemia 07/29/2013  . Asthma    childhood asthma  . Benign prostatic hypertrophy    several prostate biopsies neg for cancer.   . Bilateral hip pain   . Cancer ([place])   . CHF (congestive heart failure) ([place])   . Complication of anesthesia    MORPHINE CAUSES  HALLUCINATIONS  . CVA (cerebral vascular accident) ([place]) 06/2016  . Depression   . FH: colonic polyps 2010  . Gout   . History of kidney stones   . Hypertension   . Kidney cysts   . Morbid obesity ([place])   . Numbness    feet / legs  . PAF (paroxysmal atrial fibrillation) with RVR 07/29/2013  . Sleep apnea    USES C PAP    Past Surgical History:  Procedure Laterality Date  . ARTHROSCOPY KNEE W/ DRILLING    . BREATH TEK H PYLORI N/A 12/23/2012   Procedure: BREATH TEK H PYLORI;  Surgeon: Gayland Curry, MD;  Location: [facility];  Service: General;  Laterality: N/A;  . CHOLECYSTECTOMY  1989  . COLONOSCOPY N/A 07/31/2013   Procedure: COLONOSCOPY;  Surgeon: Gatha Mayer, MD;  Location: [facility];  Service: Endoscopy;  Laterality: N/A;  . ESOPHAGOGASTRODUODENOSCOPY N/A 07/22/2013   Procedure: ESOPHAGOGASTRODUODENOSCOPY (EGD);  Surgeon: Irene Shipper, MD;  Location: [facility] ENDOSCOPY;  Service: Endoscopy;  Laterality: N/A;  . ESOPHAGOGASTRODUODENOSCOPY N/A 07/31/2013   Procedure: ESOPHAGOGASTRODUODENOSCOPY (EGD);  Surgeon: Gatha Mayer, MD;  Location: [facility] ENDOSCOPY;  Service: Endoscopy;  Laterality: N/A;  . INTRAOPERATIVE TRANSESOPHAGEAL ECHOCARDIOGRAM N/A 07/18/2013   Procedure: INTRAOPERATIVE TRANSESOPHAGEAL ECHOCARDIOGRAM;  Surgeon: Grace Isaac, MD;  Location: [facility];  Service: Open Heart Surgery;  Laterality: N/A;  . LAPAROSCOPIC GASTRIC BANDING WITH HIATAL HERNIA REPAIR N/A 04/08/2013   Procedure: LAPAROSCOPIC  GASTRIC BANDING WITH possible  HIATAL HERNIA REPAIR;  Surgeon: Gayland Curry, MD;  Location: [facility];  Service: General;  Laterality: N/A;  . LITHOTRIPSY    . renal calculi    . SUBXYPHOID PERICARDIAL WINDOW N/A 07/18/2013   Procedure: SUBXYPHOID PERICARDIAL WINDOW;  Surgeon: Grace Isaac, MD;  Location: [facility];  Service: Thoracic;  Laterality: N/A;  . TONSILLECTOMY    . total knee replacment     bilat  . trigger finger repair     also lue, cts surgically repaired  . WISDOM  TOOTH EXTRACTION      Family Psychiatric History: Reviewed.  Family History:  Family History  Problem Relation Age of Onset  . Depression Mother   . Hypertension Mother   . Dementia Mother   . Heart disease Father        MI  . Depression Brother   . Stroke Paternal Aunt        CVA  . Diabetes Paternal Uncle   . Heart disease Paternal Uncle        MI  . Heart disease Paternal Grandmother        MI  . Diabetes Cousin        PATERNAL    Social History:  Social History   Socioeconomic History  . Marital status: Married    Spouse name: Not on file  . Number of children: 1  . Years of education: Not on file  . Highest education level: Not on file  Occupational History  . Occupation: Merchant navy officer  Social Needs  . Financial resource strain: Not on file  . Food insecurity:    Worry: Not on file    Inability: Not on file  . Transportation needs:    Medical: Not on file    Non-medical: Not on file  Tobacco Use  . Smoking status: Former Smoker    Packs/day: 0.20    Years: 13.00    Pack years: 2.60    Types: Cigarettes    Start date: 04/03/1961    Last attempt to quit: 08/30/1974    Years since quitting: 43.0  . Smokeless tobacco: Never Used  Substance and Sexual Activity  . Alcohol use: No    Alcohol/week: 0.0 oz  . Drug use: No  . Sexual activity: Not Currently  Lifestyle  . Physical activity:    Days per week: Not on file    Minutes per session: Not on file  . Stress: Not on file  Relationships  . Social connections:    Talks on phone: Not on file    Gets together: Not on file    Attends religious service: Not on file    Active member of club or organization: Not on file    Attends meetings of clubs or organizations: Not on file    Relationship status: Not on file  Other Topics Concern  . Not on file  Social History Narrative   Lives at home w/ his wife   Right-handed   Caffeine: decaf coffee, occasional tea    Allergies:  Allergies   Allergen Reactions  . Morphine Other (See Comments)    Nausea and Severe hallucinations  . Codeine Nausea Only  . Penicillins Other (See Comments)    LOC with shot  . Propoxyphene N-Acetaminophen Nausea Only    Metabolic Disorder Labs: Lab Results  Component Value Date   HGBA1C 5.8 (H) 06/19/2016   MPG 120 06/19/2016   MPG 128 (H) 12/05/2012  No results found for: PROLACTIN Lab Results  Component Value Date   CHOL 124 06/19/2016   TRIG 74 06/19/2016   HDL 32 (L) 06/19/2016   CHOLHDL 3.9 06/19/2016   VLDL 15 06/19/2016   LDLCALC 77 06/19/2016   LDLCALC 81 04/26/2016   Lab Results  Component Value Date   TSH 2.10 10/24/2016   TSH 2.68 10/20/2015    Therapeutic Level Labs: No results found for: LITHIUM No results found for: VALPROATE No components found for:  CBMZ  Current Medications: Current Outpatient Medications  Medication Sig Dispense Refill  . apixaban (ELIQUIS) 5 MG TABS tablet     . apixaban (ELIQUIS) 5 MG TABS tablet Take 1 tablet (5 mg total) by mouth 2 (two) times daily. 180 tablet 1  . atorvastatin (LIPITOR) 80 MG tablet TAKE 1 TABLET BY MOUTH EVERY DAY 90 tablet 1  . CALCIUM PO Take 500 mg by mouth 2 (two) times daily.     . Cyanocobalamin (VITAMIN B-12) 1000 MCG SUBL Place 5,000 mcg under the tongue every evening.     [name] Sodium (DSS) 100 MG CAPS Take 100 mg by mouth every other day.    . finasteride (PROSCAR) 5 MG tablet Take 1 tablet (5 mg total) by mouth daily. 30 tablet 1  . flecainide (TAMBOCOR) 50 MG tablet TAKE 1 TABLET BY MOUTH EVERY 12 HOURS 180 tablet 0  . loratadine (CLARITIN) 10 MG tablet Take 10 mg by mouth daily.    [name] LORazepam (ATIVAN) 0.5 MG tablet Take 1 tablet (0.5 mg total) by mouth at bedtime. 30 tablet 2  . montelukast (SINGULAIR) 10 MG tablet TAKE 1 TABLET BY MOUTH EVERYDAY AT BEDTIME 30 tablet 5  . pantoprazole (PROTONIX) 40 MG tablet TAKE 1 TABLET BY MOUTH EVERY DAY 90 tablet 1  . potassium chloride SA (KLOR-CON M20)  20 MEQ tablet Take 2 tablets (40 mEq total) by mouth daily. 180 tablet 1  . rOPINIRole (REQUIP) 0.5 MG tablet TAKE 1 TABLET AT BEDTIME 90 tablet 1  . sertraline (ZOLOFT) 100 MG tablet Take 1.5 tablets (150 mg total) by mouth daily. 45 tablet 2  . torsemide (DEMADEX) 20 MG tablet TAKE 1 TABLET (20 MG TOTAL) DAILY BY MOUTH, MUST FOLLOW UP WITH CARDIOLOGIST 90 tablet 0  . traMADol (ULTRAM) 50 MG tablet Take 1 tablet (50 mg total) by mouth 2 (two) times daily. 60 tablet 0   No current facility-administered medications for this visit.      Musculoskeletal: Strength & Muscle Tone: decreased Gait & Station: walking with cane Patient leans: Right  Psychiatric Specialty Exam: Review of Systems  Constitutional: Positive for malaise/fatigue. Negative for weight loss.       Weight gain Tired and exhausted  Respiratory: Positive for shortness of breath.   Cardiovascular: Positive for leg swelling.  Musculoskeletal: Positive for back pain and joint pain.  Neurological: Positive for tingling.    Blood pressure (!) 152/82, pulse 75, height 6' (1.829 m), weight (!) 384 lb (174.2 kg), SpO2 94 %.Body mass index is 52.08 kg/m.  General Appearance: Casual  Eye Contact:  Good  Speech:  Slow  Volume:  Normal  Mood:  Anxious  Affect:  Congruent  Thought Process:  Goal Directed  Orientation:  Full (Time, Place, and Person)  Thought Content: Rumination   Suicidal Thoughts:  No  Homicidal Thoughts:  No  Memory:  Immediate;   Good Recent;   Good Remote;   Good  Judgement:  Good  Insight:  Present  Psychomotor Activity:  Decreased  Concentration:  Concentration: Fair and Attention Span: Fair  Recall:  Good  Fund of Knowledge: Good  Language: Good  Akathisia:  No  Handed:  Right  AIMS (if indicated): not done  Assets:  Communication Skills Desire for Improvement Housing Resilience Social Support  ADL's:  Intact  Cognition: WNL  Sleep:  Fair   Screenings: PHQ2-9     Office Visit from  07/28/2016 in Dr. Alysia Penna-  Nut[facility]16/[facility] Office Visit from 03/30/2014 in [facility] Visit from 03/07/2013 in [facility] at  [employer] Total Score  0  0  0  0  PHQ-9 Total Score  2  -  -  -       Assessment and Plan: Major depressive disorder, recurrent.  Anxiety disorder NOS.  Discussed weight gain.  Patient has generalized fatigue, tiredness and shortness of breath.  Recommended to see his primary care physician or his cardiologist.  His depression and anxiety is under control.  I will continue Zoloft 150 mg daily and lorazepam 0.5 mg at bedtime.  Patient is not interested in counseling.  Recommended to call us back if is any question, concern for feel worsening of the symptoms.  Follow-up in 3 months.   Kathlee Nations, MD 08/28/2017, 1:37 PM

## 2017-09-07 NOTE — Telephone Encounter (Signed)
error 

## 2017-09-17 ENCOUNTER — Telehealth: Payer: Self-pay | Admitting: Internal Medicine

## 2017-09-17 NOTE — Telephone Encounter (Signed)
Returned call to patient of Dr. Debara Pickett. He reports concerns about SOB. He was told by another physician that he gained over 20lbs from March 28th until now. He was 357lbs at his last visit with cardiology in Nov 2018 and is now 382lbs. He reports his PCP increased his diuretic - he is now taking torsemide 20mg  BID and has been on this dose since 08/28/17. He reports he is not eating anything more than normal, but has instead reduced the amount he has been eating but is gaining weight and still has swelling. He has not contacted his PCP office to provide an update on how he is tolerating the med change they suggested. He provided some medical history related to his current symptoms below.  April 2015 - ED - 2 pulmonary embolism, liver issues, pericarditis, CHF -- SOB started at this time and has worsened March 2018 - stroke -- has dizziness related to this  Advised patient I would notify MD for further direction on his concerns and call him back

## 2017-09-17 NOTE — Telephone Encounter (Signed)
New message    Patient calling with concerns of SOB, dizziness, swelling   1) How much weight have you gained and in what time span?   2) If swelling, where is the swelling located? ABDOMEN, LEGS, FEET, ANKLES   3) Are you currently taking a fluid pill? YES  4) Are you currently SOB? YES  5) Do you have a log of your daily weights (if so, list)? 382 today,   6) Have you gained 3 pounds in a day or 5 pounds in a week?   7) Have you traveled recently? NO

## 2017-09-18 NOTE — Telephone Encounter (Signed)
Patient called. Scheduled for PA OV 10/09/17

## 2017-09-18 NOTE — Telephone Encounter (Signed)
Needs a follow-up appointment - sounds like if his diuretic was increased, can be in a month or so.  Dr. Lemmie Evens

## 2017-09-30 ENCOUNTER — Other Ambulatory Visit: Payer: Self-pay | Admitting: Internal Medicine

## 2017-10-01 ENCOUNTER — Encounter: Payer: Self-pay | Admitting: Physical Medicine & Rehabilitation

## 2017-10-01 ENCOUNTER — Ambulatory Visit: Payer: Self-pay

## 2017-10-01 ENCOUNTER — Ambulatory Visit (HOSPITAL_BASED_OUTPATIENT_CLINIC_OR_DEPARTMENT_OTHER): Payer: Medicare Other | Admitting: Physical Medicine & Rehabilitation

## 2017-10-01 ENCOUNTER — Other Ambulatory Visit: Payer: Self-pay

## 2017-10-01 ENCOUNTER — Encounter: Payer: Medicare Other | Attending: Physical Medicine & Rehabilitation

## 2017-10-01 VITALS — BP 120/73 | HR 68 | Wt 383.0 lb

## 2017-10-01 DIAGNOSIS — M533 Sacrococcygeal disorders, not elsewhere classified: Secondary | ICD-10-CM

## 2017-10-01 MED ORDER — TRAMADOL HCL 50 MG PO TABS: 50.0000 mg | ORAL_TABLET | Freq: Two times a day (BID) | ORAL | 0 refills | Status: DC

## 2017-10-01 NOTE — Progress Notes (Signed)
Right sacroiliac injection under fluoroscopic guidance  Indication: Right Low back and buttocks pain not relieved by medication management and other conservative care.  Informed consent was obtained after describing risks and benefits of the procedure with the patient, this includes bleeding, bruising, infection, paralysis and medication side effects. The patient wishes to proceed and has given written consent. The patient was placed in a prone position. The lumbar and sacral area was marked and prepped with Betadine. A 25-gauge 1-1/2 inch needle was inserted into the skin and subcutaneous tissue and 1 mL of 1% lidocaine was injected. Then a 22-gauge 5 inch spinal needle was inserted under fluoroscopic guidance into the Right sacroiliac joint. AP and lateral images were utilized. Omnipaque 180x0.5 mL under live fluoroscopy demonstrated no intravascular uptake. Then a solution containing one ML of 6 mgper ml betamethasone and 2 ML of 1% lidocaine MPF was injected x1.5 mL. Patient tolerated the procedure well. Post procedure instructions were given. Please see post procedure form.  Pt is complaining of pain at L5 bilaterally as well , worse with standing, paraspinal tenderness at L5 .  Pain increased with standing We discussed that medial branch blocks may be helpful to block pain from L5-S1 level

## 2017-10-01 NOTE — Patient Instructions (Signed)
Sacroiliac injection was performed today. A combination of a naming medicine plus a cortisone medicine was injected. The injection was done under x-ray guidance. This procedure has been performed to help reduce low back and buttocks pain as well as potentially hip pain. The duration of this injection is variable lasting from hours to  Months. It may repeated if needed. 

## 2017-10-01 NOTE — Progress Notes (Signed)
  PROCEDURE RECORD [facility]   Name: [name] DOB:May 09, 1945 MRN: [id number]  Date:10/01/2017  Physician: Alysia Penna, MD    Nurse/CMA: Betti Cruz  Allergies:  Allergies  Allergen Reactions  . Morphine Other (See Comments)    Nausea and Severe hallucinations  . Codeine Nausea Only  . Penicillins Other (See Comments)    LOC with shot  . Propoxyphene N-Acetaminophen Nausea Only    Consent Signed: Yes.    Is patient diabetic? No.  CBG today?   Pregnant: No. LMP: No LMP for male patient. (age 21-55)  Anticoagulants: yes (Eliquis, did not stop taking) Anti-inflammatory: no Antibiotics: no  Procedure: Sacroilliac injection Position: Prone Start Time: 1:20pm End Time: 1:24 Fluoro Time: 23  RN/CMA [name], CMA [name], CMA    Time 12:39pm 1:30pm    BP 120/73 140/59    Pulse 67 70    Respirations 14 14    O2 Sat 95 95    S/S 6 6    Pain Level 7/10 6/10     D/C home with wife, patient A & O X 3, D/C instructions reviewed, and sits independently.

## 2017-10-05 ENCOUNTER — Telehealth: Payer: Self-pay | Admitting: Internal Medicine

## 2017-10-05 NOTE — Telephone Encounter (Signed)
Spoke with [name] regarding AWV. Pt declined to schedule at this time. SF

## 2017-10-09 ENCOUNTER — Ambulatory Visit (INDEPENDENT_AMBULATORY_CARE_PROVIDER_SITE_OTHER): Payer: Medicare Other | Admitting: Physician Assistant

## 2017-10-09 ENCOUNTER — Encounter: Payer: Self-pay | Admitting: Physician Assistant

## 2017-10-09 VITALS — BP 104/70 | HR 73 | Ht 73.0 in | Wt 382.0 lb

## 2017-10-09 DIAGNOSIS — R0600 Dyspnea, unspecified: Secondary | ICD-10-CM

## 2017-10-09 DIAGNOSIS — I10 Essential (primary) hypertension: Secondary | ICD-10-CM | POA: Diagnosis not present

## 2017-10-09 DIAGNOSIS — I480 Paroxysmal atrial fibrillation: Secondary | ICD-10-CM

## 2017-10-09 DIAGNOSIS — I5033 Acute on chronic diastolic (congestive) heart failure: Secondary | ICD-10-CM

## 2017-10-09 DIAGNOSIS — R0609 Other forms of dyspnea: Secondary | ICD-10-CM | POA: Diagnosis not present

## 2017-10-09 DIAGNOSIS — G463 Brain stem stroke syndrome: Secondary | ICD-10-CM

## 2017-10-09 MED ORDER — TORSEMIDE 20 MG PO TABS: 40.0000 mg | ORAL_TABLET | Freq: Two times a day (BID) | ORAL | 3 refills | Status: DC

## 2017-10-09 MED ORDER — POTASSIUM CHLORIDE CRYS ER 20 MEQ PO TBCR: 40.0000 meq | EXTENDED_RELEASE_TABLET | Freq: Every day | ORAL | 3 refills | Status: DC

## 2017-10-09 NOTE — Progress Notes (Signed)
Cardiology Office Note   Date:  10/09/2017   ID:  [name], DOB 08-Jan-1946, MRN [id number]  PCP:  Janith Lima, MD  Cardiologist: Dr. Debara Pickett, 02/20/2017 Rosaria Ferries, PA-C   Chief Complaint  Patient presents with  . Follow-up    Weight gain.  . Edema    Legs and feet.  . Shortness of Breath  . Headache    History of Present Illness: [name] is a 72 y.o. 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 Fib, HTN, OSA,  nl MV 2015  02/20/2017 office visit, Lasix switched to torsemide, weight 357 pounds, patient felt euvolemic 09/18/2017 phone notes regarding increased weight, increased edema>> appointment made  [name] presents for cardiology follow up.  Pt saw his psychiatrist 05/28>>PCP consulted and torsemide increased 20 mg>>40 mg qd. Pt feels he is urinating about the same.   Does not weigh daily. Says it is depressing.   He eats out all the time. Neither he or his wife can stand up long enough to cook. They eat at [employer], [employer] and other restaurants.   He has chronic back pain and MS issues that limit his activity.  He has an exercise bicycle that he thinks he can ride, but has not done that in 4 years.  He gets short of breath walking to the mailbox which is 50-75 feet.  When they go to the grocery store, he waits in his car and his wife goes and gets the food.  They have not been watching the sodium in the foods they eat.  He is sure that he drinks more than 2 L a day.   Past Medical History:  Diagnosis Date  . Anemia 07/29/2013  . Asthma    childhood asthma  . Benign prostatic hypertrophy    several prostate biopsies neg for cancer.   . Bilateral hip pain   . Cancer ([place])   . CHF (congestive heart failure) ([place])   . Complication of anesthesia    MORPHINE CAUSES HALLUCINATIONS  . CVA (cerebral vascular accident)  ([place]) 06/2016  . Depression   . FH: colonic polyps 2010  . Gout   . History of kidney stones   . Hypertension   . Kidney cysts   . Morbid obesity ([place])   . Numbness    feet / legs  . PAF (paroxysmal atrial fibrillation) with RVR 07/29/2013  . Sleep apnea    USES C PAP    Past Surgical History:  Procedure Laterality Date  . ARTHROSCOPY KNEE W/ DRILLING    . BREATH TEK H PYLORI N/A 12/23/2012   Procedure: BREATH TEK H PYLORI;  Surgeon: Gayland Curry, MD;  Location: [facility];  Service: General;  Laterality: N/A;  . CHOLECYSTECTOMY  1989  . COLONOSCOPY N/A 07/31/2013   Procedure: COLONOSCOPY;  Surgeon: Gatha Mayer, MD;  Location: [place];  Service: Endoscopy;  Laterality: N/A;  . ESOPHAGOGASTRODUODENOSCOPY N/A 07/22/2013   Procedure: ESOPHAGOGASTRODUODENOSCOPY (EGD);  Surgeon: Irene Shipper, MD;  Location[facility];  Service: Endoscopy;  Laterality: N/A;  . ESOPHAGOGASTRODUODENOSCOPY N/A 07/31/2013   Procedure: ESOPHAGOGASTRODUODENOSCOPY (EGD);  Surgeon: Gatha Mayer, MD;  Location: [facility] ENDOSCOPY;  Service: Endoscopy;  Laterality: N/A;  . INTRAOPERATIVE TRANSESOPHAGEAL ECHOCARDIOGRAM N/A 07/18/2013   Procedure: INTRAOPERATIVE TRANSESOPHAGEAL ECHOCARDIOGRAM;  Surgeon: Grace Isaac, MD;  Location: [place];  Service: Open Heart Surgery;  Laterality: N/A;  . LAPAROSCOPIC GASTRIC BANDING WITH HIATAL HERNIA REPAIR N/A 04/08/2013   Procedure: LAPAROSCOPIC GASTRIC BANDING WITH possible  HIATAL HERNIA REPAIR;  Surgeon: Gayland Curry, MD;  Location: [facility];  Service: General;  Laterality: N/A;  . LITHOTRIPSY    . renal calculi    . SUBXYPHOID PERICARDIAL WINDOW N/A 07/18/2013   Procedure: SUBXYPHOID PERICARDIAL WINDOW;  Surgeon: Grace Isaac, MD;  Location: [place];  Service: Thoracic;  Laterality: N/A;  . TONSILLECTOMY    . total knee replacment     bilat  . trigger finger repair     also lue, cts surgically repaired  . WISDOM TOOTH EXTRACTION      Current Outpatient  Medications  Medication Sig Dispense Refill  . apixaban (ELIQUIS) 5 MG TABS tablet Take 1 tablet (5 mg total) by mouth 2 (two) times daily. 180 tablet 1  . atorvastatin (LIPITOR) 80 MG tablet TAKE 1 TABLET BY MOUTH EVERY DAY 90 tablet 1  . CALCIUM PO Take 500 mg by mouth 2 (two) times daily.     . Cyanocobalamin (VITAMIN B-12) 1000 MCG SUBL Place 5,000 mcg under the tongue every evening.     [name] Sodium (DSS) 100 MG CAPS Take 100 mg by mouth every other day.    . finasteride (PROSCAR) 5 MG tablet Take 1 tablet (5 mg total) by mouth daily. 30 tablet 1  . flecainide (TAMBOCOR) 50 MG tablet TAKE 1 TABLET BY MOUTH EVERY 12 HOURS 180 tablet 0  . loratadine (CLARITIN) 10 MG tablet Take 10 mg by mouth daily.    [name] LORazepam (ATIVAN) 0.5 MG tablet Take 1 tablet (0.5 mg total) by mouth at bedtime. 30 tablet 2  . montelukast (SINGULAIR) 10 MG tablet TAKE 1 TABLET BY MOUTH EVERYDAY AT BEDTIME 90 tablet 1  . pantoprazole (PROTONIX) 40 MG tablet TAKE 1 TABLET BY MOUTH EVERY DAY 90 tablet 1  . potassium chloride SA (KLOR-CON M20) 20 MEQ tablet Take 2 tablets (40 mEq total) by mouth daily. 180 tablet 1  . rOPINIRole (REQUIP) 0.5 MG tablet TAKE 1 TABLET AT BEDTIME 90 tablet 1  . sertraline (ZOLOFT) 100 MG tablet Take 1.5 tablets (150 mg total) by mouth daily. 45 tablet 2  . torsemide (DEMADEX) 20 MG tablet Take 1 tablet (20 mg total) by mouth 2 (two) times daily. 180 tablet 0  . traMADol (ULTRAM) 50 MG tablet Take 1 tablet (50 mg total) by mouth 2 (two) times daily. (Patient taking differently: Take 50 mg by mouth as needed. ) 30 tablet 0   No current facility-administered medications for this visit.     Allergies:   Morphine; Codeine; Penicillins; and Propoxyphene n-acetaminophen    Social History:  The patient  reports that he quit smoking about 43 years ago. His smoking use included cigarettes. He started smoking about 56 years ago. He has a 2.60 pack-year smoking history. He has never used  smokeless tobacco. He reports that he does not drink alcohol or use drugs.   Family History:  The patient's family history includes Dementia in his mother; Depression in his brother and mother; Diabetes in his cousin and paternal uncle; Heart disease in his father, paternal grandmother, and paternal uncle; Hypertension in his mother; Stroke in his paternal aunt.    ROS:  Please see the history of present illness. All other systems are reviewed and negative.    PHYSICAL EXAM: VS:  BP 104/70 (BP Location: Left Arm, Patient Position: Sitting, Cuff Size:  Large)   Pulse 73   Ht 6\' 1"  (1.854 m)   Wt (!) 382 lb (173.3 kg)   BMI 50.40 kg/m  , BMI Body mass index is 50.4 kg/m. GEN: Well nourished, well developed, male in no acute distress  HEENT: normal for age  Neck: no JVD seen, difficult to assess 2nd body habitus, no carotid bruit, no masses Cardiac: RRR; soft murmur, no rubs, or gallops Respiratory: Decreased breath sounds bases but clear bilaterally, normal work of breathing GI: soft, nontender, nondistended, + BS MS: no deformity or atrophy; 2+ bilateral lower extremity edema; distal pulses are 2+ in upper extremities, difficult to palpate in both lower extremity secondary to swelling but capillary refill is within normal limits Skin: warm and dry, no rash Neuro:  Strength and sensation are intact Psych: euthymic mood, full affect   EKG:  EKG is ordered today. The ekg ordered today demonstrates sinus rhythm, heart rate 73, artifact noted but does not interfere with interpretation.  No acute ischemic changes and no significant change from 02/20/2017  MYOVIEW: 05/17/2016  The left ventricular ejection fraction is normal (55-65%).  Nuclear stress EF: 55%.  There was no ST segment deviation noted during stress.  No T wave inversion was noted during stress.  The study is normal.  This is a low risk study.    ECHO: 06/19/2016 - Left ventricle: The cavity size was moderately  dilated. Wall   thickness was increased in a pattern of mild LVH. Systolic   function was normal. The estimated ejection fraction was in the   range of 60% to 65%. Wall motion was normal; there were no   regional wall motion abnormalities. Doppler parameters are   consistent with abnormal left ventricular relaxation (grade 1   diastolic dysfunction). Doppler parameters are consistent with   indeterminate ventricular filling pressure. - Aortic valve: Transvalvular velocity was within the normal range.   There was no stenosis. There was no regurgitation. - Mitral valve: Transvalvular velocity was within the normal range.   There was no evidence for stenosis. There was no regurgitation. - Left atrium: The atrium was mildly dilated. - Right ventricle: The cavity size was normal. Wall thickness was   normal. Systolic function was normal. - Right atrium: The atrium was mildly dilated. - Tricuspid valve: There was trivial regurgitation.  Recent Labs: 10/24/2016: TSH 2.10 01/26/2017: Hemoglobin 14.8; Platelets 225.0 02/20/2017: BUN 20; Creatinine, Ser 1.19; NT-Pro BNP 107; Potassium 3.5; Sodium 143    Lipid Panel    Component Value Date/Time   CHOL 124 06/19/2016 0730   TRIG 74 06/19/2016 0730   HDL 32 (L) 06/19/2016 0730   CHOLHDL 3.9 06/19/2016 0730   VLDL 15 06/19/2016 0730   LDLCALC 77 06/19/2016 0730   LDLDIRECT 168.0 10/20/2015 1417     Wt Readings from Last 3 Encounters:  10/09/17 (!) 382 lb (173.3 kg)  10/01/17 (!) 383 lb (173.7 kg)  06/26/17 (!) 365 lb (165.6 kg)     Other studies Reviewed: Additional studies/ records that were reviewed today include: office notes, hospital records and testing.  ASSESSMENT AND PLAN:  1.  Acute on chronic diastolic CHF: - Torsemide was increased from 20 mg daily to 40 mg daily by his PCP with no change in his volume. - Increase torsemide to 40 mg twice daily for 5 days, then resume 40 mg daily if he has lost at least 10 pounds - Limit  sodium to 500 mg per meal and all liquids to 2  L daily -Check BMET today - I looked up some of the foods that he likes to eat and his favorite restaurants in the amount of sodium in them came as a shot to [place] both.  He is requested to look online and try to find lower sodium items to order when he eats out.  [employer] has some excellent choices  2.  Dyspnea on exertion: - His symptoms are worse now than they were when he was 365 pounds.  At that time, there was no pulmonary cause of his symptoms and I believe that is true now. - I believe his dyspnea is multifactorial and related to CHF, deconditioning, and obesity - He is encouraged to increase his activity as he is able  3.  PAF: -He has not had any palpitations or symptoms. -He is to continue the flecainide and Eliquis.  4.  Hypertension: -His blood pressure is well controlled, I am hoping that his blood pressure will not drop on the increased dose of torsemide -He is not on any other blood pressure lowering medications except finasteride, which he needs.   Current medicines are reviewed at length with the patient today.  The patient does not have concerns regarding medicines.  The following changes have been made: Increase torsemide and potassium  Labs/ tests ordered today include:   Orders Placed This Encounter  Procedures  . Basic metabolic panel  . EKG 12-Lead     Disposition:   FU with Dr. Debara Pickett  Signed, Rosaria Ferries, PA-C  10/09/2017 4:16 PM    [facility] Phone: [phone]; Fax: [phone]  This note was written with the assistance of speech recognition software. Please excuse any transcriptional errors.

## 2017-10-09 NOTE — Patient Instructions (Addendum)
Medication Instructions: Your physician has recommended you make the following change in your medication:   INCREASE: Torsemide to 40 mg twice a day for 5 days. If you lose 10 lbs then you may return back to taking your normal dose.  INCREASE: Potassium to 40 mg twice a day for 5 days     Labwork: TODAY: BMET   Procedures/Testing: None Ordered   Follow-Up: Your physician recommends that you schedule a follow-up appointment in: August with Rosaria Ferries PA-C   Any Additional Special Instructions Will Be Listed Below (If Applicable).  Please weigh yourself daily and keep a log of it  Look up nutritional restaurants and there menus before you pick a place to eat   Please limit yourself to at least 2 liters of fluid a day    Low-Sodium Eating Plan Sodium, which is an element that makes up salt, helps you maintain a healthy balance of fluids in your body. Too much sodium can increase your blood pressure and cause fluid and waste to be held in your body. Your health care provider or dietitian may recommend following this plan if you have high blood pressure (hypertension), kidney disease, liver disease, or heart failure. Eating less sodium can help lower your blood pressure, reduce swelling, and protect your heart, liver, and kidneys. What are tips for following this plan? General guidelines  Most people on this plan should limit their sodium intake to 1,500-2,000 mg (milligrams) of sodium each day. (500 mg per meal)  Reading food labels  The Nutrition Facts label lists the amount of sodium in one serving of the food. If you eat more than one serving, you must multiply the listed amount of sodium by the number of servings.  Choose foods with less than 140 mg of sodium per serving.  Avoid foods with 300 mg of sodium or more per serving. Shopping  Look for lower-sodium products, often labeled as "low-sodium" or "no salt added."  Always check the sodium content even if foods are  labeled as "unsalted" or "no salt added".  Buy fresh foods. ? Avoid canned foods and premade or frozen meals. ? Avoid canned, cured, or processed meats  Buy breads that have less than 80 mg of sodium per slice. Cooking  Eat more home-cooked food and less restaurant, buffet, and fast food.  Avoid adding salt when cooking. Use salt-free seasonings or herbs instead of table salt or sea salt. Check with your health care provider or pharmacist before using salt substitutes.  Cook with plant-based oils, such as canola, sunflower, or olive oil. Meal planning  When eating at a restaurant, ask that your food be prepared with less salt or no salt, if possible.  Avoid foods that contain MSG (monosodium glutamate). MSG is sometimes added to [place] food, bouillon, and some canned foods. What foods are recommended? The items listed may not be a complete list. Talk with your dietitian about what dietary choices are best for you. Grains Low-sodium cereals, including oats, puffed wheat and rice, and shredded wheat. Low-sodium crackers. Unsalted rice. Unsalted pasta. Low-sodium bread. Whole-grain breads and whole-grain pasta. Vegetables Fresh or frozen vegetables. "No salt added" canned vegetables. "No salt added" tomato sauce and paste. Low-sodium or reduced-sodium tomato and vegetable juice. Fruits Fresh, frozen, or canned fruit. Fruit juice. Meats and other protein foods Fresh or frozen (no salt added) meat, poultry, seafood, and fish. Low-sodium canned tuna and salmon. Unsalted nuts. Dried peas, beans, and lentils without added salt. Unsalted canned beans. Eggs. Unsalted  nut butters. Dairy Milk. Soy milk. Cheese that is naturally low in sodium, such as ricotta cheese, fresh mozzarella, or Swiss cheese Low-sodium or reduced-sodium cheese. Cream cheese. Yogurt. Fats and oils Unsalted butter. Unsalted margarine with no trans fat. Vegetable oils such as canola or olive oils. Seasonings and other  foods Fresh and dried herbs and spices. Salt-free seasonings. Low-sodium mustard and ketchup. Sodium-free salad dressing. Sodium-free light mayonnaise. Fresh or refrigerated horseradish. Lemon juice. Vinegar. Homemade, reduced-sodium, or low-sodium soups. Unsalted popcorn and pretzels. Low-salt or salt-free chips. What foods are not recommended? The items listed may not be a complete list. Talk with your dietitian about what dietary choices are best for you. Grains Instant hot cereals. Bread stuffing, pancake, and biscuit mixes. Croutons. Seasoned rice or pasta mixes. Noodle soup cups. Boxed or frozen macaroni and cheese. Regular salted crackers. Self-rising flour. Vegetables Sauerkraut, pickled vegetables, and relishes. Olives. [place] fries. Onion rings. Regular canned vegetables (not low-sodium or reduced-sodium). Regular canned tomato sauce and paste (not low-sodium or reduced-sodium). Regular tomato and vegetable juice (not low-sodium or reduced-sodium). Frozen vegetables in sauces. Meats and other protein foods Meat or fish that is salted, canned, smoked, spiced, or pickled. Bacon, ham, sausage, hotdogs, corned beef, chipped beef, packaged lunch meats, salt pork, jerky, pickled herring, anchovies, regular canned tuna, sardines, salted nuts. Dairy Processed cheese and cheese spreads. Cheese curds. Blue cheese. Feta cheese. String cheese. Regular cottage cheese. Buttermilk. Canned milk. Fats and oils Salted butter. Regular margarine. Ghee. Bacon fat. Seasonings and other foods Onion salt, garlic salt, seasoned salt, table salt, and sea salt. Canned and packaged gravies. Worcestershire sauce. Tartar sauce. Barbecue sauce. Teriyaki sauce. Soy sauce, including reduced-sodium. Steak sauce. Fish sauce. Oyster sauce. Cocktail sauce. Horseradish that you find on the shelf. Regular ketchup and mustard. Meat flavorings and tenderizers. Bouillon cubes. Hot sauce and Tabasco sauce. Premade or packaged  marinades. Premade or packaged taco seasonings. Relishes. Regular salad dressings. Salsa. Potato and tortilla chips. Corn chips and puffs. Salted popcorn and pretzels. Canned or dried soups. Pizza. Frozen entrees and pot pies. Summary  Eating less sodium can help lower your blood pressure, reduce swelling, and protect your heart, liver, and kidneys.  Most people on this plan should limit their sodium intake to 1,500-2,000 mg (milligrams) of sodium each day.  Canned, boxed, and frozen foods are high in sodium. Restaurant foods, fast foods, and pizza are also very high in sodium. You also get sodium by adding salt to food.  Try to cook at home, eat more fresh fruits and vegetables, and eat less fast food, canned, processed, or prepared foods. This information is not intended to replace advice given to you by your health care provider. Make sure you discuss any questions you have with your health care provider. Document Released: 09/09/2001 Document Revised: 03/13/2016 Document Reviewed: 03/13/2016 Elsevier Interactive Patient Education  [employer].     If you need a refill on your cardiac medications before your next appointment, please call your pharmacy.

## 2017-10-10 LAB — BASIC METABOLIC PANEL
BUN / CREAT RATIO: 15 (ref 10–24)
BUN: 21 mg/dL (ref 8–27)
CO2: 28 mmol/L (ref 20–29)
CREATININE: 1.38 mg/dL — AB (ref 0.76–1.27)
Calcium: 9.1 mg/dL (ref 8.6–10.2)
Chloride: 100 mmol/L (ref 96–106)
GFR calc Af Amer: 59 mL/min/{1.73_m2} — ABNORMAL LOW (ref >59)
GFR calc non Af Amer: 51 mL/min/{1.73_m2} — ABNORMAL LOW (ref >59)
GLUCOSE: 96 mg/dL (ref 65–99)
Potassium: 3.8 mmol/L (ref 3.5–5.2)
SODIUM: 145 mmol/L — AB (ref 134–144)

## 2017-10-17 ENCOUNTER — Telehealth: Payer: Self-pay | Admitting: Physician Assistant

## 2017-10-17 NOTE — Telephone Encounter (Signed)
New Message    Patient calling the office for samples of medication:   1.  What medication and dosage are you requesting samples for?  apixaban (ELIQUIS) 5 MG TABS tablet  2.  Are you currently out of this medication?  Patient has 2 days remaining

## 2017-10-17 NOTE — Telephone Encounter (Signed)
Patient stated he was in the donut hole with Eliquis. Patient advised that we had some samples and also given a card to try to call the assistance line to see if they could help with the price. Patient understood and will have son to come pick up samples from up front.   Sample given- Eliquis 5mg  Lot #- [id number] EXP- 06/2019

## 2017-10-19 ENCOUNTER — Other Ambulatory Visit: Payer: Self-pay | Admitting: Internal Medicine

## 2017-10-23 ENCOUNTER — Ambulatory Visit: Payer: Self-pay | Admitting: Internal Medicine

## 2017-10-24 ENCOUNTER — Telehealth: Payer: Self-pay

## 2017-10-24 DIAGNOSIS — Z79899 Other long term (current) drug therapy: Secondary | ICD-10-CM | POA: Diagnosis not present

## 2017-10-24 NOTE — Telephone Encounter (Signed)
Pt presented in office today for repeat of lab work. Based on lab results on 10/09/17 per Rosaria Ferries, PA, pt is to have BMET checked in a week. Orders placed.        Notes recorded by Harold Hedge, CMA on 10/15/2017 at 2:51 PM EDT Patient notified directly. ------  Notes recorded by Lonn Georgia, PA-C on 10/11/2017 at 3:10 PM EDT Please let him know that his Cr is above normal for him. Need to recheck one day next week, whatever works for him.  Ask if he has lost any weight.  Thanks

## 2017-10-25 LAB — BASIC METABOLIC PANEL
BUN/Creatinine Ratio: 17 (ref 10–24)
BUN: 23 mg/dL (ref 8–27)
CALCIUM: 9 mg/dL (ref 8.6–10.2)
CHLORIDE: 102 mmol/L (ref 96–106)
CO2: 29 mmol/L (ref 20–29)
CREATININE: 1.39 mg/dL — AB (ref 0.76–1.27)
GFR calc Af Amer: 59 mL/min/{1.73_m2} — ABNORMAL LOW (ref >59)
GFR calc non Af Amer: 51 mL/min/{1.73_m2} — ABNORMAL LOW (ref >59)
GLUCOSE: 126 mg/dL — AB (ref 65–99)
Potassium: 3.8 mmol/L (ref 3.5–5.2)
Sodium: 145 mmol/L — ABNORMAL HIGH (ref 134–144)

## 2017-10-26 ENCOUNTER — Telehealth: Payer: Self-pay | Admitting: Physician Assistant

## 2017-10-26 NOTE — Telephone Encounter (Signed)
Notes recorded by Lonn Georgia, PA-C on 10/26/2017 at 2:38 PM EDT Please let him know that his BMET is about the same, make sure his breathing is better.  His sodium level is a little high, ok to take in a little more water, if he can do that without putting on fluid. Keep appt 08/05 Thanks  Patient notified of results. He voiced understanding. His breathing is about the same. He asked about dose of torsemide, he had been taking 40mg  BID as directed but he has NOT lost 10lbs as Suanne Marker PA note states. Suggested that patient continue torsemide 40mg  BID as previously noted and I will notify PA

## 2017-10-26 NOTE — Telephone Encounter (Signed)
New message:   Pt calling to get lab results.

## 2017-10-26 NOTE — Telephone Encounter (Signed)
Is the edema better? If no weight loss but edema is better, ok to wait for appt. If no weight loss and no improvement in edema, let me know. Thanks

## 2017-10-29 ENCOUNTER — Ambulatory Visit: Payer: Medicare Other | Admitting: Physical Medicine & Rehabilitation

## 2017-10-29 NOTE — Telephone Encounter (Signed)
Returned call to patient. He states he has lost 3lbs on home scales since he saw [place] PA. He reports swelling is the same as it has been for years.   Will provide update to [facility]

## 2017-10-29 NOTE — Telephone Encounter (Signed)
If he feels strongly that he needs to lose more fluid, increase torsemide to 60 mg twice daily for the next 3 days.  If he feels that his volume status is okay, continue current current dosing and follow-up as scheduled

## 2017-10-29 NOTE — Telephone Encounter (Signed)
Spoke with Suanne Marker and she advised me to have patient increase Torsemide to 3 tablets bid. Spoke with patient and he voiced understanding.

## 2017-10-30 DIAGNOSIS — J3801 Paralysis of vocal cords and larynx, unilateral: Secondary | ICD-10-CM | POA: Diagnosis not present

## 2017-10-30 DIAGNOSIS — R490 Dysphonia: Secondary | ICD-10-CM | POA: Diagnosis not present

## 2017-10-30 DIAGNOSIS — R1313 Dysphagia, pharyngeal phase: Secondary | ICD-10-CM | POA: Diagnosis not present

## 2017-10-30 DIAGNOSIS — R1310 Dysphagia, unspecified: Secondary | ICD-10-CM | POA: Diagnosis not present

## 2017-10-30 DIAGNOSIS — R633 Feeding difficulties: Secondary | ICD-10-CM | POA: Diagnosis not present

## 2017-10-31 ENCOUNTER — Other Ambulatory Visit: Payer: Self-pay | Admitting: Internal Medicine

## 2017-11-05 ENCOUNTER — Encounter: Payer: Self-pay | Admitting: Physician Assistant

## 2017-11-05 ENCOUNTER — Ambulatory Visit (INDEPENDENT_AMBULATORY_CARE_PROVIDER_SITE_OTHER): Payer: Medicare Other | Admitting: Physician Assistant

## 2017-11-05 VITALS — BP 142/70 | HR 71 | Ht 72.0 in | Wt 374.0 lb

## 2017-11-05 DIAGNOSIS — I5032 Chronic diastolic (congestive) heart failure: Secondary | ICD-10-CM

## 2017-11-05 DIAGNOSIS — R0609 Other forms of dyspnea: Secondary | ICD-10-CM

## 2017-11-05 DIAGNOSIS — G463 Brain stem stroke syndrome: Secondary | ICD-10-CM | POA: Diagnosis not present

## 2017-11-05 DIAGNOSIS — R0600 Dyspnea, unspecified: Secondary | ICD-10-CM

## 2017-11-05 MED ORDER — TORSEMIDE 20 MG PO TABS: 60.0000 mg | ORAL_TABLET | Freq: Every day | ORAL | 1 refills | Status: DC

## 2017-11-05 NOTE — Patient Instructions (Addendum)
Medication Instructions:  DECREASE Torsemide to 60mg  Take 1 tablet once a day   Labwork: Your physician recommends that you return for lab work in: TODAY-BMET  Testing/Procedures: NONE   Follow-Up: Your physician recommends that you schedule a follow-up appointment in: 2 MONTHS WITH DR [name], ONLY  Any Other Special Instructions Will Be Listed Below (If Applicable). 1. DO NOT EAT MORE THAN 500MG  OF SODIUM PER MEAL  2. DO NOT DRINK MORE THAN 2 LITERS OF FLUID PER DAY 3. INCREASE ACTIVITY WITH IN LIMITATIONS 4.  CONTINUE TO  CHECK YOUR WEIGHTS DAILY  If you need a refill on your cardiac medications before your next appointment, please call your pharmacy.

## 2017-11-05 NOTE — Progress Notes (Signed)
Cardiology Office Note   Date:  11/05/2017   ID:  [name], DOB 1945-06-08, MRN [id number]  PCP:  Janith Lima, MD  Cardiologist: Dr. Debara Pickett, 02/20/2017 [name] Ferries, PA-C 10/09/2017  Chief Complaint  Patient presents with  . Follow-up    pt c/o SOB and occasional dizziness--states he almost fell in the lobby today; passed out last week when he sat down in a chair at Dr. Hazle Coca office, states he was out for about 20 seconds    History of Present Illness: [name] is a 72 y.o. male with a history of morbid obesity s/p bariatric surgery (peak wt 394 lbs), PE on Xarelto>>d/c 2nd GIB 2nd PUD>>Eliquis, pericardial effusion 2nd pericarditis s/p window, A. Fib, HTN, OSA,  nl MV 2015  7/9 office visit, patient weight 382 pounds, torsemide had been increased from 20-> 40 mg qd by PCP, increased to 40 mg twice daily, compliance with sodium and fluid restrictions encouraged Multiple phone notes 7/26-7/29, patient had lost 3 pounds but still with edema, torsemide increased to 60 mg twice daily  [name] presents for cardiology follow up.  He is currently taking torsemide 60 mg bid.   He is not eating out as much. He is eating Premier protein shakes and sandwiches for breakfast and lunch. Uses whole wheat bread with chicken salad or peanut butter, tomatoes or bananas for the sandwiches.   The the lower extremity swelling is about the same as it has been for the last 10+ years.   He is very SOB going to the car. DOE w/ minimal exertion.  Has been this way for a long time.  The weight that he lost on the higher dose of torsemide did not really seem to change that.  He had an episode of LOC at the otolaryngologist's office.  He had just walked, and was sitting in a chair.  His brother was present.  The patient states his brother told him that all of a sudden his eyes glazed over and then his head rolled back for about 15 or 20 seconds.  He remembers being  lightheaded.  I reviewed the note from the doctor's office and it does not mention this.  [name] states that he felt better in a short period of time and did not seek medical attention.  He is extremely inactive because of severe back problems.  He is getting some injections in his back states the pain is severe enough that he cannot stand or walk for long periods of time.  He does have an exercise bike that he says he might be able to ride some.   Past Medical History:  Diagnosis Date  . Anemia 07/29/2013  . Asthma    childhood asthma  . Benign essential HTN   . Benign prostatic hypertrophy    several prostate biopsies neg for cancer.   . Bilateral hip pain   . Cancer ([place])   . CHF (congestive heart failure) ([place])   . Chronic anticoagulation 07/22/2013  . Complication of anesthesia    MORPHINE CAUSES HALLUCINATIONS  . CVA (cerebral vascular accident) ([place]) 06/2016  . Depression   . Diastolic congestive heart failure ([place]) 07/16/2013  . Dysphagia, post-stroke   . Embolic cerebral infarction ([place]) 06/21/2016  . FH: colonic polyps 2010  . Gout   . History of kidney stones   . Hypertension   . Kidney cysts   . Morbid obesity ([place])   . Numbness  feet / legs  . PAF (paroxysmal atrial fibrillation) with RVR 07/29/2013  . Recurrent pulmonary emboli ([place]) 06/13/2015  . Sleep apnea    USES C PAP  . Stroke syndrome   . 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 ARTHROSCOPY KNEE W/ DRILLING    . BREATH TEK H PYLORI N/A 12/23/2012   Procedure: BREATH TEK H PYLORI;  Surgeon: Gayland Curry, MD;  Location: [facility];  Service: General;  Laterality: N/A;  . CHOLECYSTECTOMY  1989  . COLONOSCOPY N/A 07/31/2013   Procedure: COLONOSCOPY;  Surgeon: Gatha Mayer, MD;  Location: [facility];  Service: Endoscopy;  Laterality: N/A;  . ESOPHAGOGASTRODUODENOSCOPY N/A 07/22/2013   Procedure: ESOPHAGOGASTRODUODENOSCOPY (EGD);  Surgeon: Irene Shipper,  MD;  Location[facility];  Service: Endoscopy;  Laterality: N/A;  . ESOPHAGOGASTRODUODENOSCOPY N/A 07/31/2013   Procedure: ESOPHAGOGASTRODUODENOSCOPY (EGD);  Surgeon: Gatha Mayer, MD;  Location[facility] ENDOSCOPY;  Service: Endoscopy;  Laterality: N/A;  . INTRAOPERATIVE TRANSESOPHAGEAL ECHOCARDIOGRAM N/A 07/18/2013   Procedure: INTRAOPERATIVE TRANSESOPHAGEAL ECHOCARDIOGRAM;  Surgeon: Grace Isaac, MD;  Location: [facility];  Service: Open Heart Surgery;  Laterality: N/A;  . LAPAROSCOPIC GASTRIC BANDING WITH HIATAL HERNIA REPAIR N/A 04/08/2013   Procedure: LAPAROSCOPIC GASTRIC BANDING WITH possible  HIATAL HERNIA REPAIR;  Surgeon: Gayland Curry, MD;  Location: [facility];  Service: General;  Laterality: N/A;  . LITHOTRIPSY    . renal calculi    . SUBXYPHOID PERICARDIAL WINDOW N/A 07/18/2013   Procedure: SUBXYPHOID PERICARDIAL WINDOW;  Surgeon: Grace Isaac, MD;  Location: [facility];  Service: Thoracic;  Laterality: N/A;  . TONSILLECTOMY    . total knee replacment     bilat  . trigger finger repair     also lue, cts surgically repaired  . WISDOM TOOTH EXTRACTION      Current Outpatient Medications  Medication Sig Dispense Refill  . apixaban (ELIQUIS) 5 MG TABS tablet Take 1 tablet (5 mg total) by mouth 2 (two) times daily. 180 tablet 1  . atorvastatin (LIPITOR) 80 MG tablet TAKE 1 TABLET BY MOUTH EVERY DAY 90 tablet 1  . CALCIUM PO Take 500 mg by mouth 2 (two) times daily.     . Cyanocobalamin (VITAMIN B-12) 1000 MCG SUBL Place 5,000 mcg under the tongue every evening.     [name] Sodium (DSS) 100 MG CAPS Take 100 mg by mouth every other day.    . finasteride (PROSCAR) 5 MG tablet Take 1 tablet (5 mg total) by mouth daily. 30 tablet 1  . flecainide (TAMBOCOR) 50 MG tablet TAKE 1 TABLET BY MOUTH EVERY 12 HOURS 180 tablet 0  . loratadine (CLARITIN) 10 MG tablet Take 10 mg by mouth daily.    [name] LORazepam (ATIVAN) 0.5 MG tablet Take 1 tablet (0.5 mg total) by mouth at bedtime. 30 tablet 2  .  montelukast (SINGULAIR) 10 MG tablet TAKE 1 TABLET BY MOUTH EVERYDAY AT BEDTIME 90 tablet 1  . pantoprazole (PROTONIX) 40 MG tablet TAKE 1 TABLET BY MOUTH EVERY DAY 90 tablet 1  . potassium chloride SA (KLOR-CON M20) 20 MEQ tablet Take 2 tablets (40 mEq total) by mouth daily. 180 tablet 3  . rOPINIRole (REQUIP) 0.5 MG tablet TAKE 1 TABLET AT BEDTIME 90 tablet 1  . sertraline (ZOLOFT) 100 MG tablet Take 1.5 tablets (150 mg total) by mouth daily. 45 tablet 2  . torsemide (DEMADEX) 20 MG tablet Take 2 tablets (40 mg total) by mouth  2 (two) times daily. (Patient taking differently: Take 60 mg by mouth 2 (two) times daily. ) 180 tablet 3  . traMADol (ULTRAM) 50 MG tablet Take 1 tablet (50 mg total) by mouth 2 (two) times daily. (Patient taking differently: Take 50 mg by mouth as needed. ) 30 tablet 0   No current facility-administered medications for this visit.     Allergies:   Morphine; Codeine; Penicillins; and Propoxyphene n-acetaminophen    Social History:  The patient  reports that he quit smoking about 43 years ago. His smoking use included cigarettes. He started smoking about 56 years ago. He has a 2.60 pack-year smoking history. He has never used smokeless tobacco. He reports that he does not drink alcohol or use drugs.   Family History:  The patient's family history includes Dementia in his mother; Depression in his brother and mother; Diabetes in his cousin and paternal uncle; Heart disease in his father, paternal grandmother, and paternal uncle; Hypertension in his mother; Stroke in his paternal aunt.    ROS:  Please see the history of present illness. All other systems are reviewed and negative.    PHYSICAL EXAM: VS:  BP (!) 142/70   Pulse 71   Ht 6' (1.829 m)   Wt (!) 374 lb (169.6 kg)   SpO2 97%   BMI 50.72 kg/m  , BMI Body mass index is 50.72 kg/m. GEN: Well nourished, well developed, male in no acute distress  HEENT: normal for age  Neck: no JVD seen but difficult to  assess secondary to body habitus, no carotid bruit, no masses Cardiac: RRR; soft murmur, no rubs, or gallops Respiratory: Decreased breath sounds bases but clear bilaterally, normal work of breathing GI: soft, nontender, nondistended, + BS MS: no deformity or atrophy; 2+ edema; distal pulses are 2+ in upper extremities   Skin: warm and dry, no rash Neuro:  Strength and sensation are intact Psych: euthymic mood, full affect   EKG:  EKG is not ordered today.  ECHO: 06/19/2016 - Left ventricle: The cavity size was moderately dilated. Wall   thickness was increased in a pattern of mild LVH. Systolic   function was normal. The estimated ejection fraction was in the   range of 60% to 65%. Wall motion was normal; there were no   regional wall motion abnormalities. Doppler parameters are   consistent with abnormal left ventricular relaxation (grade 1   diastolic dysfunction). Doppler parameters are consistent with   indeterminate ventricular filling pressure. - Aortic valve: Transvalvular velocity was within the normal range.   There was no stenosis. There was no regurgitation. - Mitral valve: Transvalvular velocity was within the normal range.   There was no evidence for stenosis. There was no regurgitation. - Left atrium: The atrium was mildly dilated. - Right ventricle: The cavity size was normal. Wall thickness was   normal. Systolic function was normal. - Right atrium: The atrium was mildly dilated. - Tricuspid valve: There was trivial regurgitation.  Recent Labs: 01/26/2017: Hemoglobin 14.8; Platelets 225.0 02/20/2017: NT-Pro BNP 107 10/24/2017: BUN 23; Creatinine, Ser 1.39; Potassium 3.8; Sodium 145    Lipid Panel    Component Value Date/Time   CHOL 124 06/19/2016 0730   TRIG 74 06/19/2016 0730   HDL 32 (L) 06/19/2016 0730   CHOLHDL 3.9 06/19/2016 0730   VLDL 15 06/19/2016 0730   LDLCALC 77 06/19/2016 0730   LDLDIRECT 168.0 10/20/2015 1417     Wt Readings from Last 3  Encounters:  11/05/17 [name])  374 lb (169.6 kg)  10/09/17 (!) 382 lb (173.3 kg)  10/01/17 (!) 383 lb (173.7 kg)     Other studies Reviewed: Additional studies/ records that were reviewed today include: Office notes, hospital records and testing.  ASSESSMENT AND PLAN:  1.  Chronic diastolic CHF: He is lost 8 pounds and I feel this is his dry weight despite the lower extremity edema. - Continue torsemide to 60 mg daily with daily weights.  He is to continue managing his intake and paying attention to sodium and fluid intake. - Check a BMET today - Follow-up in 2 months.  2.  Morbid obesity: His weight is almost back to his peak prior to the bariatric surgery.  He is encouraged to pay attention to not just the sodium in the amount of liquids that he is eating but also the amounts. -He does not want to see a nutritionist.  He does not want to see the life coach.  3.  Dyspnea on exertion: He has been no apnea as well as significant dyspnea on exertion.  He ambulated in the office today and O2 saturations stayed above 95%.  He was very short of breath with this and also had some weakness as well.  -However, he became lightheaded and dizzy and had to sit down at one point.  I feel he may be a little bit dry.  He concurs with this because he says he has severe [place]. - I believe he has some problems with obesity hypoventilation, as well as significant deconditioning contributing to his shortness of breath.  He is encouraged to increase activity as tolerated, understanding that he has significant limitations because of his musculoskeletal issues..   Current medicines are reviewed at length with the patient today.  The patient has concerns regarding medicines.  Concerns were addressed  The following changes have been made: Decrease torsemide to 60 mg once a day  Labs/ tests ordered today include:   Orders Placed This Encounter  Procedures  . Basic Metabolic Panel (BMET)     Disposition:    FU with Dr. Debara Pickett  Signed, [name] Ferries, PA-C  11/05/2017 5:59 PM    [facility] Phone: [phone]; Fax: [phone]  This note was written with the assistance of speech recognition software. Please excuse any transcriptional errors.

## 2017-11-06 ENCOUNTER — Ambulatory Visit: Payer: Self-pay | Admitting: Internal Medicine

## 2017-11-06 LAB — BASIC METABOLIC PANEL
BUN / CREAT RATIO: 16 (ref 10–24)
BUN: 23 mg/dL (ref 8–27)
CALCIUM: 9.1 mg/dL (ref 8.6–10.2)
CHLORIDE: 98 mmol/L (ref 96–106)
CO2: 30 mmol/L — ABNORMAL HIGH (ref 20–29)
CREATININE: 1.45 mg/dL — AB (ref 0.76–1.27)
GFR calc Af Amer: 56 mL/min/{1.73_m2} — ABNORMAL LOW (ref >59)
GFR calc non Af Amer: 48 mL/min/{1.73_m2} — ABNORMAL LOW (ref >59)
GLUCOSE: 87 mg/dL (ref 65–99)
Potassium: 3.6 mmol/L (ref 3.5–5.2)
Sodium: 147 mmol/L — ABNORMAL HIGH (ref 134–144)

## 2017-11-08 ENCOUNTER — Encounter: Payer: Medicare Other | Attending: Physical Medicine & Rehabilitation

## 2017-11-08 ENCOUNTER — Ambulatory Visit (HOSPITAL_BASED_OUTPATIENT_CLINIC_OR_DEPARTMENT_OTHER): Payer: Medicare Other | Admitting: Physical Medicine & Rehabilitation

## 2017-11-08 VITALS — BP 123/74 | HR 61 | Ht 72.0 in | Wt 374.0 lb

## 2017-11-08 DIAGNOSIS — Z79891 Long term (current) use of opiate analgesic: Secondary | ICD-10-CM

## 2017-11-08 DIAGNOSIS — G894 Chronic pain syndrome: Secondary | ICD-10-CM | POA: Diagnosis not present

## 2017-11-08 DIAGNOSIS — M533 Sacrococcygeal disorders, not elsewhere classified: Secondary | ICD-10-CM | POA: Diagnosis not present

## 2017-11-08 DIAGNOSIS — M47817 Spondylosis without myelopathy or radiculopathy, lumbosacral region: Secondary | ICD-10-CM

## 2017-11-08 DIAGNOSIS — Z5181 Encounter for therapeutic drug level monitoring: Secondary | ICD-10-CM

## 2017-11-08 NOTE — Patient Instructions (Signed)

## 2017-11-08 NOTE — Progress Notes (Signed)
Bilateral Lumbar  L4  medial branch blocks and L 5 dorsal ramus injection under fluoroscopic guidance  Indication: Lumbar pain which is not relieved by medication management or other conservative care and interfering with self-care and mobility.  Informed consent was obtained after describing risks and benefits of the procedure with the patient, this includes bleeding, infection, paralysis and medication side effects.  The patient wishes to proceed and has given written consent.  The patient was placed in prone position.  The lumbar area was marked and prepped with Betadine.  One mL of 1% lidocaine was injected into each of 6 areas into the skin and subcutaneous tissue.  Then a 22-gauge 5in spinal needle was inserted targeting the junction of the left S1 superior articular process and sacral ala junction. Needle was advanced under fluoroscopic guidance.  Bone contact was made.  Isovue 200 was injected x 0.5 mL demonstrating no intravascular uptake.  Then a solution  of 2% MPF lidocaine was injected x 0.5 mL.  Then the left L5 superior articular process in transverse process junction was targeted.  Bone contact was made.  Isovue 200 was injected x 0.5 mL demonstrating no intravascular uptake. Then a solution containing  2% MPF lidocaine was injected x 0.5 mL. This same procedure was performed on the right side using the same needle, technique and injectate.  Patient tolerated procedure well.  Post procedure instructions were given.

## 2017-11-08 NOTE — Progress Notes (Signed)
  PROCEDURE RECORD [facility]   Name: CHANSON TEEMS DOB:June 01, 1945 MRN: [id number]  Date:11/08/2017  Physician: Alysia Penna, MD    Nurse/CMA: Betti Cruz  Allergies:  Allergies  Allergen Reactions  . Morphine Other (See Comments)    Nausea and Severe hallucinations  . Codeine Nausea Only  . Penicillins Other (See Comments)    LOC with shot  . Propoxyphene N-Acetaminophen Nausea Only    Consent Signed: Yes.    Is patient diabetic? No.  CBG today? NA  Pregnant: No. LMP: No LMP for male patient. (age 5-55)  Anticoagulants: yes (Elaquis twice a day, no need to come off for injection) Anti-inflammatory: no Antibiotics: no  Procedure: Bilateral Medial Branch Block Position: Prone Start Time: 9:40am End Time: 9:49am Fluoro Time: 39  RN/CMA [name], CMA Delcie Ruppert, CMA    Time 9:15am 10:00am    BP 123/74 131/79    Pulse 61 63    Respirations 14 14    O2 Sat 93 95    S/S 6 6    Pain Level 6/10 5/10     D/C home with brother, patient A & O X 3, D/C instructions reviewed, and sits independently.

## 2017-11-09 ENCOUNTER — Other Ambulatory Visit: Payer: Self-pay

## 2017-11-09 DIAGNOSIS — Z79899 Other long term (current) drug therapy: Secondary | ICD-10-CM

## 2017-11-09 NOTE — Progress Notes (Signed)
BMET in 2 weeks

## 2017-11-13 ENCOUNTER — Other Ambulatory Visit (INDEPENDENT_AMBULATORY_CARE_PROVIDER_SITE_OTHER): Payer: Medicare Other

## 2017-11-13 ENCOUNTER — Ambulatory Visit (INDEPENDENT_AMBULATORY_CARE_PROVIDER_SITE_OTHER): Payer: Medicare Other | Admitting: Internal Medicine

## 2017-11-13 ENCOUNTER — Encounter: Payer: Self-pay | Admitting: Internal Medicine

## 2017-11-13 VITALS — BP 124/62 | HR 71 | Temp 98.7°F | Resp 20 | Ht 72.0 in | Wt 382.4 lb

## 2017-11-13 DIAGNOSIS — I10 Essential (primary) hypertension: Secondary | ICD-10-CM

## 2017-11-13 DIAGNOSIS — E785 Hyperlipidemia, unspecified: Secondary | ICD-10-CM

## 2017-11-13 DIAGNOSIS — R7303 Prediabetes: Secondary | ICD-10-CM

## 2017-11-13 DIAGNOSIS — G463 Brain stem stroke syndrome: Secondary | ICD-10-CM | POA: Diagnosis not present

## 2017-11-13 LAB — TSH: TSH: 4.5 u[IU]/mL (ref 0.35–4.50)

## 2017-11-13 LAB — CBC WITH DIFFERENTIAL/PLATELET
Basophils Absolute: 0.1 10*3/uL (ref 0.0–0.1)
Basophils Relative: 1 % (ref 0.0–3.0)
EOS ABS: 0.1 10*3/uL (ref 0.0–0.7)
Eosinophils Relative: 1 % (ref 0.0–5.0)
HEMATOCRIT: 41.8 % (ref 39.0–52.0)
HEMOGLOBIN: 14.3 g/dL (ref 13.0–17.0)
LYMPHS PCT: 16.5 % (ref 12.0–46.0)
Lymphs Abs: 1.9 10*3/uL (ref 0.7–4.0)
MCHC: 34.1 g/dL (ref 30.0–36.0)
MCV: 88.8 fl (ref 78.0–100.0)
Monocytes Absolute: 1.3 10*3/uL — ABNORMAL HIGH (ref 0.1–1.0)
Monocytes Relative: 11.4 % (ref 3.0–12.0)
Neutro Abs: 8.2 10*3/uL — ABNORMAL HIGH (ref 1.4–7.7)
Neutrophils Relative %: 70.1 % (ref 43.0–77.0)
PLATELETS: 219 10*3/uL (ref 150.0–400.0)
RBC: 4.71 Mil/uL (ref 4.22–5.81)
RDW: 14.3 % (ref 11.5–15.5)
WBC: 11.6 10*3/uL — AB (ref 4.0–10.5)

## 2017-11-13 LAB — LIPID PANEL
CHOL/HDL RATIO: 5
Cholesterol: 151 mg/dL (ref 0–200)
HDL: 30.2 mg/dL — ABNORMAL LOW (ref >39.00)
NonHDL: 121.14
Triglycerides: 283 mg/dL — ABNORMAL HIGH (ref 0.0–149.0)
VLDL: 56.6 mg/dL — AB (ref 0.0–40.0)

## 2017-11-13 LAB — LDL CHOLESTEROL, DIRECT: Direct LDL: 93 mg/dL

## 2017-11-13 LAB — HEMOGLOBIN A1C: Hgb A1c MFr Bld: 6.3 % (ref 4.6–6.5)

## 2017-11-13 NOTE — Progress Notes (Signed)
Subjective:  Patient ID: [name], male    DOB: 1946-02-16  Age: 72 y.o. MRN: [id number]  CC: Hypertension and Hyperlipidemia   HPI LABRANDON KNOCH presents for f/up - He continues to struggle with a myriad of symptoms including shortness of breath, lower extremity edema, low back pain, dizziness, and lightheadedness.  He tells me that about 2 weeks ago he was seeing a doctor in [place] when he had a syncopal episode.  He had been fasting all day and felt like he became dehydrated.  He was triaged at the scene and given some liquids and quickly recovered.  He has had no more syncopal episodes since then.  Outpatient Medications Prior to Visit  Medication Sig Dispense Refill  . apixaban (ELIQUIS) 5 MG TABS tablet Take 1 tablet (5 mg total) by mouth 2 (two) times daily. 180 tablet 1  . atorvastatin (LIPITOR) 80 MG tablet TAKE 1 TABLET BY MOUTH EVERY DAY 90 tablet 1  . CALCIUM PO Take 500 mg by mouth 2 (two) times daily.     . Cyanocobalamin (VITAMIN B-12) 1000 MCG SUBL Place 5,000 mcg under the tongue every evening.     [name] Sodium (DSS) 100 MG CAPS Take 100 mg by mouth every other day.    . finasteride (PROSCAR) 5 MG tablet Take 1 tablet (5 mg total) by mouth daily. 30 tablet 1  . flecainide (TAMBOCOR) 50 MG tablet TAKE 1 TABLET BY MOUTH EVERY 12 HOURS 180 tablet 0  . loratadine (CLARITIN) 10 MG tablet Take 10 mg by mouth daily.    [name] LORazepam (ATIVAN) 0.5 MG tablet Take 1 tablet (0.5 mg total) by mouth at bedtime. 30 tablet 2  . montelukast (SINGULAIR) 10 MG tablet TAKE 1 TABLET BY MOUTH EVERYDAY AT BEDTIME 90 tablet 1  . pantoprazole (PROTONIX) 40 MG tablet TAKE 1 TABLET BY MOUTH EVERY DAY 90 tablet 1  . potassium chloride SA (KLOR-CON M20) 20 MEQ tablet Take 2 tablets (40 mEq total) by mouth daily. 180 tablet 3  . sertraline (ZOLOFT) 100 MG tablet Take 1.5 tablets (150 mg total) by mouth daily. 45 tablet 2  . torsemide (DEMADEX) 20 MG tablet Take 3 tablets (60 mg  total) by mouth daily. 90 tablet 1  . traMADol (ULTRAM) 50 MG tablet Take 1 tablet (50 mg total) by mouth 2 (two) times daily. (Patient taking differently: Take 50 mg by mouth as needed. ) 30 tablet 0  . rOPINIRole (REQUIP) 0.5 MG tablet TAKE 1 TABLET AT BEDTIME 90 tablet 1   No facility-administered medications prior to visit.     ROS Review of Systems  Constitutional: Positive for fatigue and unexpected weight change (wt gain). Negative for diaphoresis.  HENT: Negative.   Eyes: Negative for visual disturbance.  Respiratory: Positive for shortness of breath. Negative for apnea, cough, chest tightness and wheezing.   Cardiovascular: Positive for leg swelling. Negative for chest pain and palpitations.  Gastrointestinal: Negative for abdominal pain, constipation, diarrhea, nausea and vomiting.  Endocrine: Negative.  Negative for cold intolerance and heat intolerance.  Genitourinary: Negative.  Negative for difficulty urinating, dysuria and urgency.  Musculoskeletal: Positive for back pain. Negative for myalgias and neck pain.  Skin: Negative for color change, pallor and rash.  Neurological: Positive for dizziness, syncope and light-headedness. Negative for weakness and headaches.  Hematological: Negative for adenopathy. Does not bruise/bleed easily.  Psychiatric/Behavioral: Negative.     Objective:  BP 124/62 (BP Location: Left Arm, Patient Position: Sitting, Cuff Size:  Large)   Pulse 71   Temp 98.7 F (37.1 C) (Oral)   Resp 20   Ht 6' (1.829 m)   Wt (!) 382 lb 6.4 oz (173.5 kg)   SpO2 97%   BMI 51.86 kg/m   BP Readings from Last 3 Encounters:  11/13/17 124/62  11/08/17 123/74  11/05/17 (!) 142/70    Wt Readings from Last 3 Encounters:  11/13/17 (!) 382 lb 6.4 oz (173.5 kg)  11/08/17 (!) 374 lb (169.6 kg)  11/05/17 (!) 374 lb (169.6 kg)    Physical Exam  Constitutional: He is oriented to person, place, and time. No distress.  HENT:  Mouth/Throat: No oropharyngeal  exudate.  Eyes: Conjunctivae are normal. No scleral icterus.  Neck: Normal range of motion. Neck supple. No JVD present. No thyromegaly present.  Cardiovascular: Normal rate, regular rhythm and normal heart sounds. Exam reveals no gallop and no friction rub.  No murmur heard. Pulmonary/Chest: Effort normal and breath sounds normal. No respiratory distress. He has no wheezes. He has no rhonchi. He has no rales.  Abdominal: Soft. Bowel sounds are normal. He exhibits no mass. There is no hepatosplenomegaly. There is no tenderness.  Musculoskeletal: Normal range of motion. He exhibits edema (1+ pitting edema in BLE). He exhibits no tenderness or deformity.  Lymphadenopathy:    He has no cervical adenopathy.  Neurological: He is alert and oriented to person, place, and time.  Skin: Skin is warm and dry. No rash noted. He is not diaphoretic.  Vitals reviewed.   Lab Results  Component Value Date   WBC 11.6 (H) 11/13/2017   HGB 14.3 11/13/2017   HCT 41.8 11/13/2017   PLT 219.0 11/13/2017   GLUCOSE 87 11/05/2017   CHOL 151 11/13/2017   TRIG 283.0 (H) 11/13/2017   HDL 30.20 (L) 11/13/2017   LDLDIRECT 93.0 11/13/2017   LDLCALC 77 06/19/2016   ALT 19 06/22/2016   AST 26 06/22/2016   NA 147 (H) 11/05/2017   K 3.6 11/05/2017   CL 98 11/05/2017   CREATININE 1.45 (H) 11/05/2017   BUN 23 11/05/2017   CO2 30 (H) 11/05/2017   TSH 4.50 11/13/2017   PSA 0.68 12/24/2015   INR 1.09 06/19/2016   HGBA1C 6.3 11/13/2017   MICROALBUR 1.1 12/12/2006    Ct Chest Wo Contrast  Result Date: 02/02/2017 CLINICAL DATA:  Cough with dyspnea. EXAM: CT CHEST WITHOUT CONTRAST TECHNIQUE: Multidetector CT imaging of the chest was performed following the standard protocol without IV contrast. COMPARISON:  06/13/2015 FINDINGS: Cardiovascular: The heart size is normal. No pericardial effusion. Coronary artery calcification is evident. Atherosclerotic calcification is noted in the wall of the thoracic aorta. Aberrant  origin right subclavian artery again noted. Mediastinum/Nodes: Stable 2.1 cm right thyroid nodule. No mediastinal lymphadenopathy. No evidence for gross hilar lymphadenopathy although assessment is limited by the lack of intravenous contrast on today's study. The esophagus has normal imaging features. There is no axillary lymphadenopathy. Lungs/Pleura: No focal airspace consolidation. No pulmonary edema or pleural effusion. No suspicious pulmonary nodule or mass. Upper Abdomen: Lap band noted in situ. No acute findings within the visualized upper abdomen. Musculoskeletal: Bone windows reveal no worrisome lytic or sclerotic osseous lesions. IMPRESSION: Stable. No new or acute interval findings. No features to explain the patient's history of cough with dyspnea. Electronically Signed   By: Misty Stanley M.D.   On: 02/02/2017 17:01    Assessment & Plan:   Johnnie was seen today for hypertension and hyperlipidemia.  Diagnoses and  all orders for this visit:  Benign essential HTN- His blood pressure is adequately well controlled.  Electrolytes and renal function are normal. -     CBC with Differential/Platelet; Future -     TSH; Future  Prediabetes- His A1c is at 6.3%.  Medical therapy is not indicated.  He was encouraged to improve his lifestyle modifications. -     Hemoglobin A1c; Future  Morbid obesity ([place])- He agrees to work on his lifestyle modifications in an effort to lose weight. -     TSH; Future  Hyperlipidemia with target LDL less than 130- He has achieved his LDL goal and is doing well on the statin. -     Lipid panel; Future -     TSH; Future   I am having Asim A. Lucena "[name]" maintain his Vitamin B-12, CALCIUM PO, DSS, loratadine, finasteride, pantoprazole, apixaban, atorvastatin, sertraline, LORazepam, montelukast, traMADol, potassium chloride SA, flecainide, and torsemide.  No orders of the defined types were placed in this encounter.    Follow-up: Return in about 6  months (around 05/16/2018).  [name], MD

## 2017-11-13 NOTE — Patient Instructions (Signed)

## 2017-11-14 ENCOUNTER — Other Ambulatory Visit: Payer: Self-pay | Admitting: Internal Medicine

## 2017-11-15 ENCOUNTER — Encounter: Payer: Self-pay | Admitting: Internal Medicine

## 2017-11-15 LAB — TOXASSURE SELECT,+ANTIDEPR,UR

## 2017-11-16 IMAGING — CR DG LUMBAR SPINE 2-3V
3 series · 3 of 3 positions shown · non-contrast
Comparison: 07/31/2013 CT.

CLINICAL DATA: 70-year-old male with chronic lower back pain
extending down left leg. No injury. Initial encounter.

EXAM:
LUMBAR SPINE - 2-3 VIEW

[w lumbar spine ap]
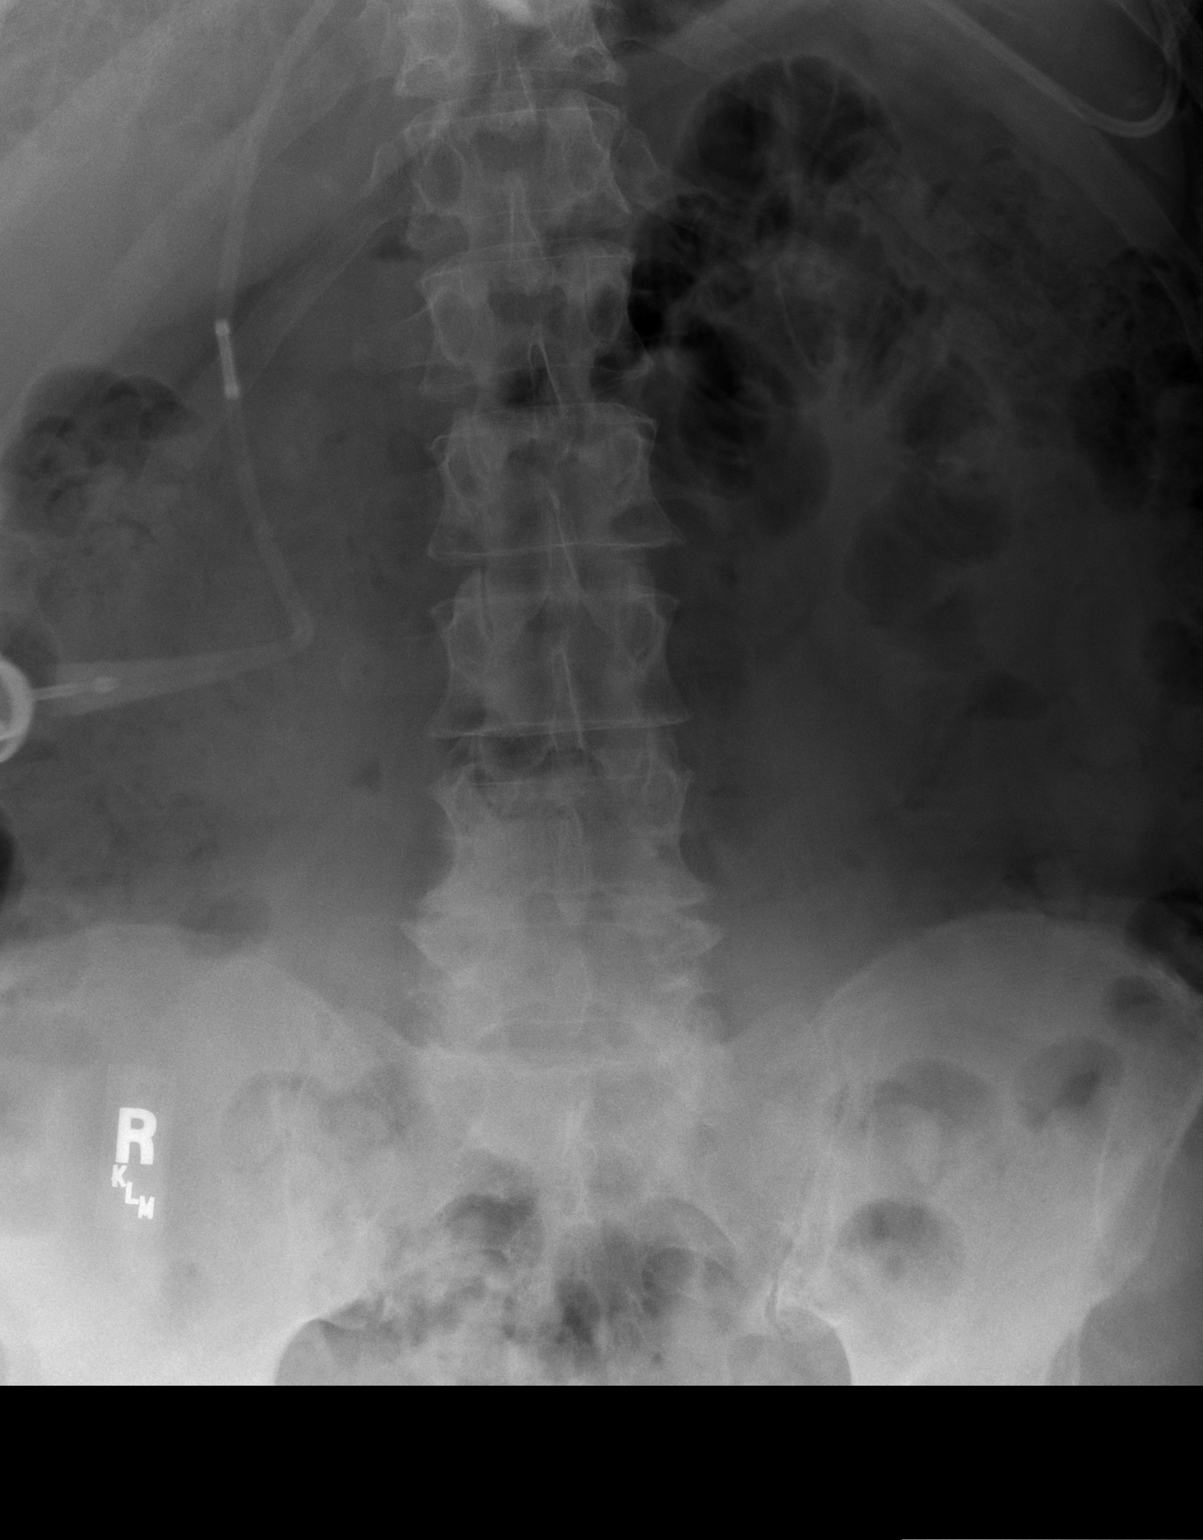

[w lumbar spine lat]
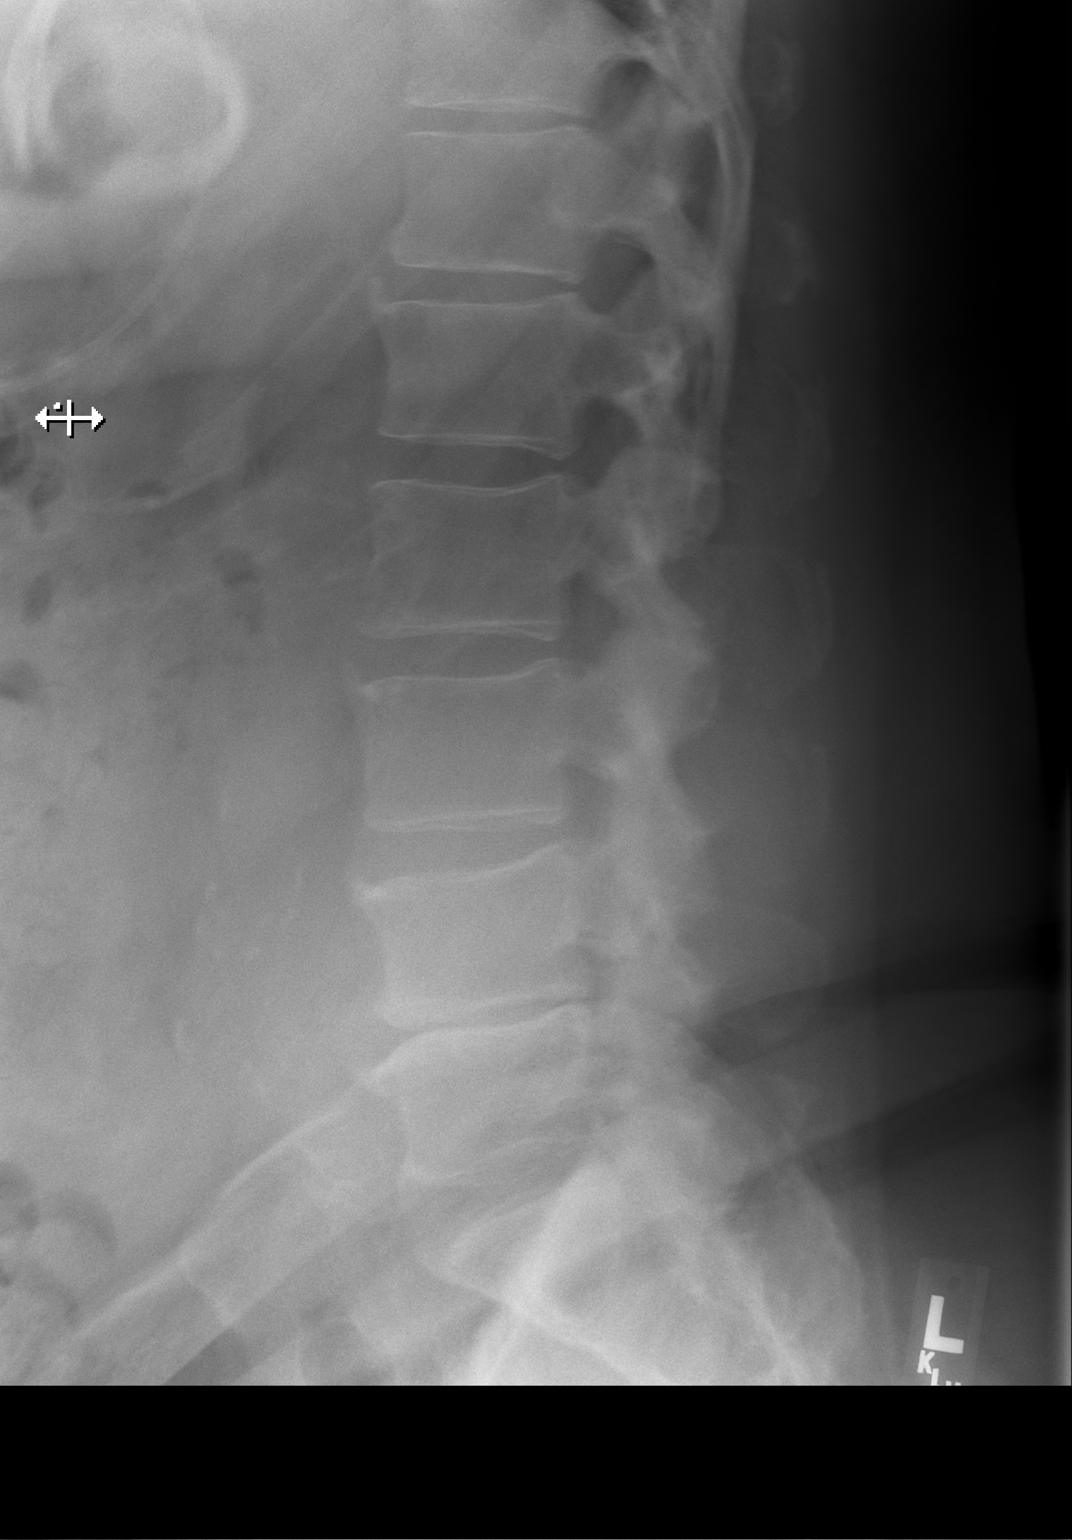

[w lumbar l-5 s-1 spot]
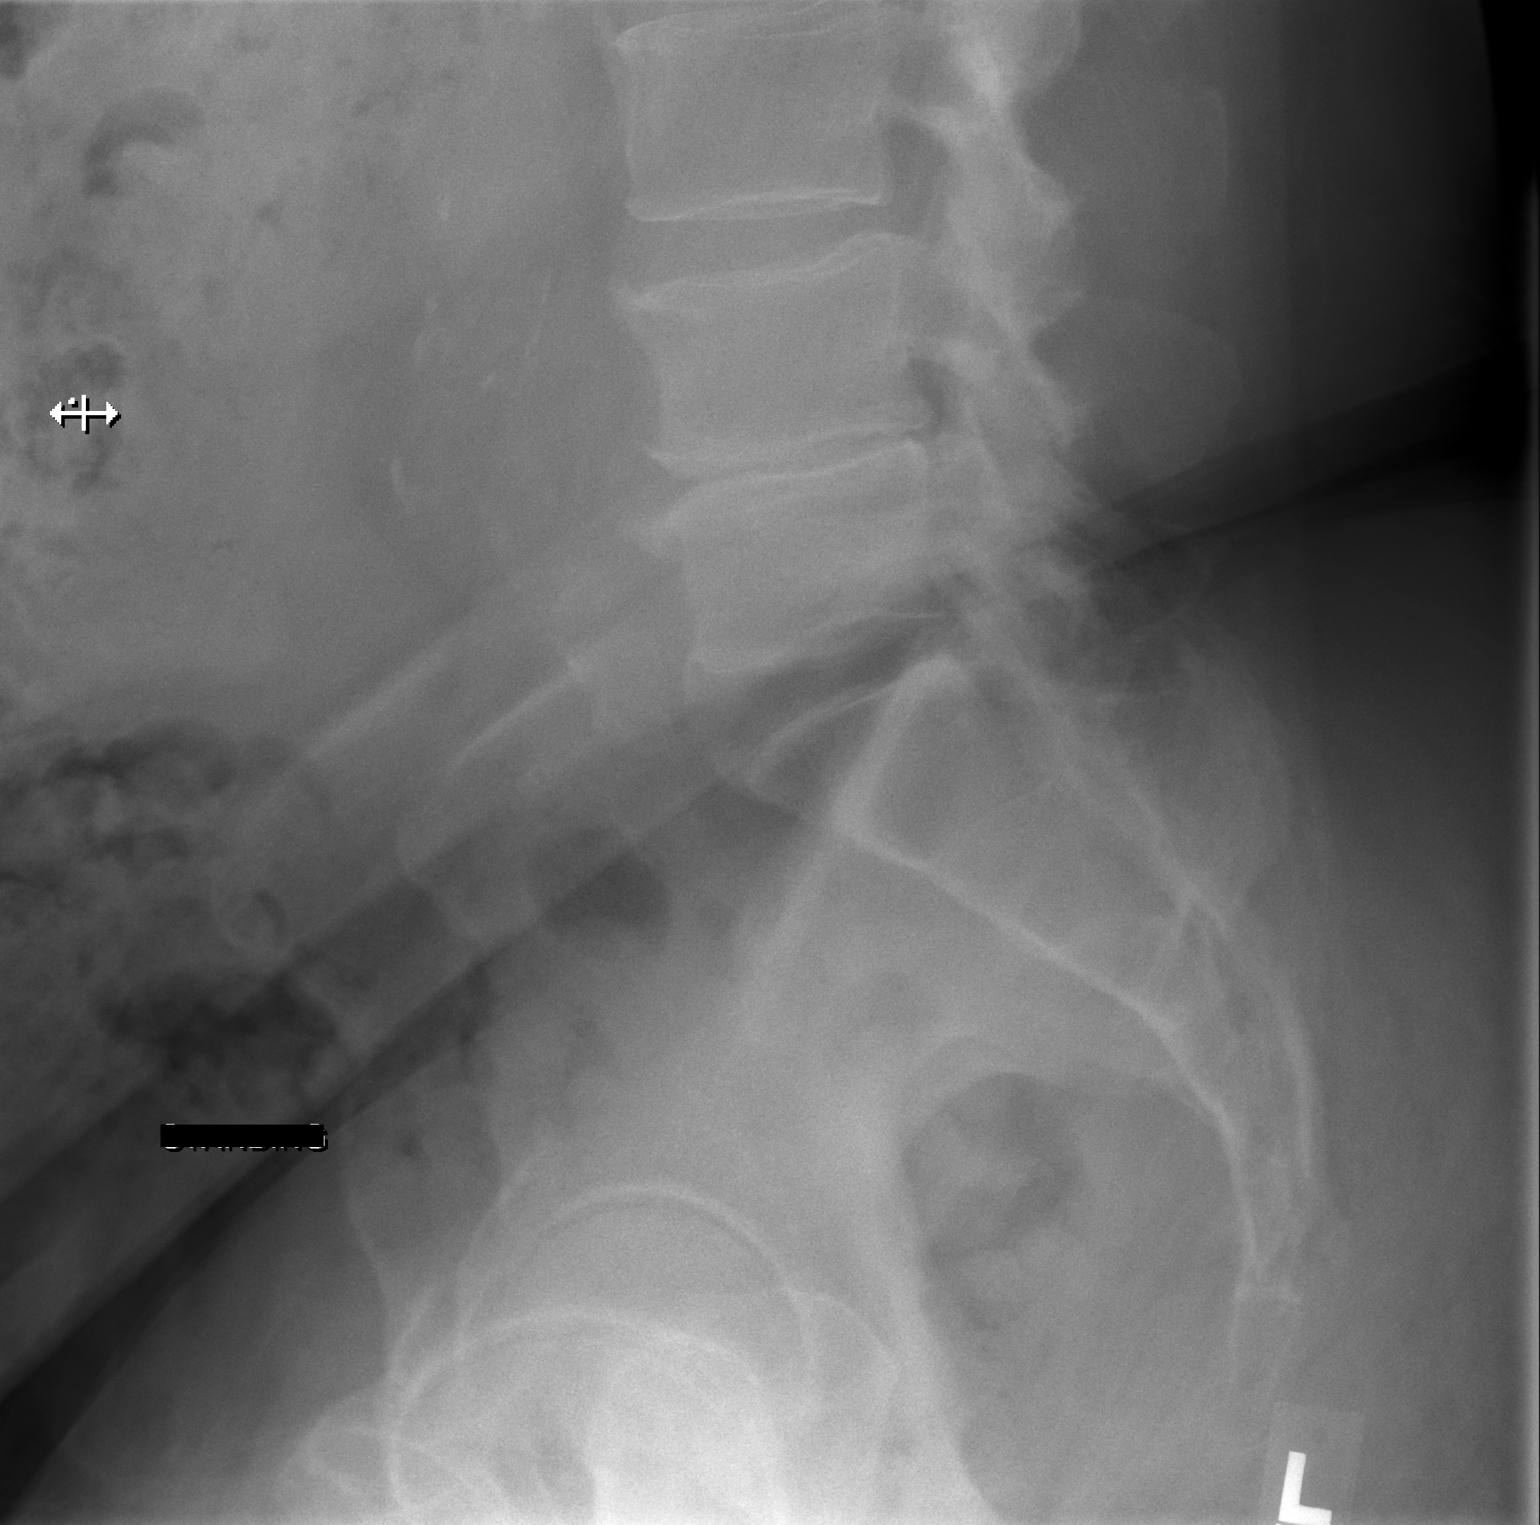

[3 of 3 positions shown; findings below may reference images not displayed]

FINDINGS: Straightening of the lumbar spine.

Moderate-to-marked L4-5 disc space narrowing.

Mild facet degenerative changes lower lumbar region.

Vascular calcifications.

Gastric band instrumentation partially visualized.
IMPRESSION: Straightening lumbar spine.

Moderate-to-marked L4-5 disc space narrowing.

Mild facet degenerative changes lower lumbar region.

Aortic Atherosclerosis ([id number]).

## 2017-11-21 ENCOUNTER — Telehealth: Payer: Self-pay | Admitting: *Deleted

## 2017-11-21 NOTE — Telephone Encounter (Signed)
Urine drug screen is consistent for having no controlled medication. He stated he was not taking his tramadol.

## 2017-11-28 ENCOUNTER — Encounter (HOSPITAL_COMMUNITY): Payer: Self-pay | Admitting: Psychiatry

## 2017-11-28 ENCOUNTER — Ambulatory Visit (INDEPENDENT_AMBULATORY_CARE_PROVIDER_SITE_OTHER): Payer: Medicare Other | Admitting: Psychiatry

## 2017-11-28 VITALS — BP 146/78 | HR 75 | Ht 72.0 in | Wt 382.0 lb

## 2017-11-28 DIAGNOSIS — F4321 Adjustment disorder with depressed mood: Secondary | ICD-10-CM

## 2017-11-28 DIAGNOSIS — Z5181 Encounter for therapeutic drug level monitoring: Secondary | ICD-10-CM | POA: Diagnosis not present

## 2017-11-28 DIAGNOSIS — Z818 Family history of other mental and behavioral disorders: Secondary | ICD-10-CM

## 2017-11-28 DIAGNOSIS — F330 Major depressive disorder, recurrent, mild: Secondary | ICD-10-CM | POA: Diagnosis not present

## 2017-11-28 DIAGNOSIS — Z87891 Personal history of nicotine dependence: Secondary | ICD-10-CM

## 2017-11-28 DIAGNOSIS — F419 Anxiety disorder, unspecified: Secondary | ICD-10-CM | POA: Diagnosis not present

## 2017-11-28 DIAGNOSIS — G463 Brain stem stroke syndrome: Secondary | ICD-10-CM

## 2017-11-28 MED ORDER — LORAZEPAM 0.5 MG PO TABS: 0.5000 mg | ORAL_TABLET | Freq: Every day | ORAL | 2 refills | Status: DC

## 2017-11-28 MED ORDER — SERTRALINE HCL 100 MG PO TABS: 150.0000 mg | ORAL_TABLET | Freq: Every day | ORAL | 2 refills | Status: DC

## 2017-11-28 NOTE — Progress Notes (Signed)
BH MD/PA/NP OP Progress Note  11/28/2017 3:14 PM [name]  MRN:  [id number]  Chief Complaint:  Chief Complaint    Follow-up; Depression    I have been falling more, and I get short of breath easily.  HPI: Patient came for his follow-up appointment.  He is taking his medication and denies any side effects.  He reported his depression and anxiety is under control even after the recent death of his mother.  He feels bad that he was not able to see her as much in her living facility, because of his poor mobility. He is more unsteady on his feet, and has been falling more. He admitted easily tired and having shortness of breath.  He thinks it is some deconditioning, and he has an exercise bike, but he has not been able to get it out and use it.  He ambulates with cane or walker. He remains frustrated with his weight.  After bariatric surgery in 2015, he had lost down to 295#, but then had medical/cardiac complications.  He has gained back to 382# over the past 3 years, and is trying to lose slowly. He has had A-fib and CHF. He does have OSA and reports compliance with CPAP despite problems with CPAP mask, and number still high, "I don't look at them anymore, since they are always to high".  He reports sleep and appetite are "ok".   He denies any irritability, anger, mania, psychosis.  He denies any paranoia, hallucination, suicidal thoughts or any crying spells.  His energy level is low.  Patient has multiple somatic complaints including lethargy, generalized weakness, tingling, neuropathy and chronic back pain.  He has no tremors shakes or any EPS.  He denies drinking or using any illegal substances.  He denies SI, HI, AVH.    Visit Diagnosis:    ICD-10-CM   1. Major depressive disorder, recurrent episode, mild (HCC) F33.0 LORazepam (ATIVAN) 0.5 MG tablet    sertraline (ZOLOFT) 100 MG tablet  2. Anxiety disorder, unspecified type F41.9   3. Grief F43.21   4. Encounter for medication  monitoring Z51.81     Past Psychiatric History: Reviewed. Patient has one psychiatric admission in 2006 due to increased depression and having suicidal thoughts. He was taking Risperdal but it was discontinued after the stroke in March 2018.  Past Medical History:  Past Medical History:  Diagnosis Date  . Anemia 07/29/2013  . Asthma    childhood asthma  . Benign essential HTN   . Benign prostatic hypertrophy    several prostate biopsies neg for cancer.   . Bilateral hip pain   . Cancer ([place])   . CHF (congestive heart failure) ([place])   . Chronic anticoagulation 07/22/2013  . Complication of anesthesia    MORPHINE CAUSES HALLUCINATIONS  . CVA (cerebral vascular accident) ([place]) 06/2016  . Depression   . Diastolic congestive heart failure ([place]) 07/16/2013  . Dysphagia, post-stroke   . 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 FH: colonic polyps 2010  . Gout   . History of kidney stones   . Hypertension   . Kidney cysts   . Morbid obesity ([place])   . Numbness    feet / legs  . PAF (paroxysmal atrial fibrillation) with RVR 07/29/2013  . Recurrent pulmonary emboli ([place]) 06/13/2015  . Sleep apnea    USES C PAP  . Stroke syndrome   . 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 ARTHROSCOPY  KNEE W/ DRILLING    . BREATH TEK H PYLORI N/A 12/23/2012   Procedure: BREATH TEK H PYLORI;  Surgeon: Gayland Curry, MD;  Location: [facility];  Service: General;  Laterality: N/A;  . CHOLECYSTECTOMY  1989  . COLONOSCOPY N/A 07/31/2013   Procedure: COLONOSCOPY;  Surgeon: Gatha Mayer, MD;  Location: [facility];  Service: Endoscopy;  Laterality: N/A;  . ESOPHAGOGASTRODUODENOSCOPY N/A 07/22/2013   Procedure: ESOPHAGOGASTRODUODENOSCOPY (EGD);  Surgeon: Irene Shipper, MD;  Location: [facility] ENDOSCOPY;  Service: Endoscopy;  Laterality: N/A;  . ESOPHAGOGASTRODUODENOSCOPY N/A 07/31/2013   Procedure: ESOPHAGOGASTRODUODENOSCOPY (EGD);  Surgeon: Gatha Mayer, MD;   Location: [facility] ENDOSCOPY;  Service: Endoscopy;  Laterality: N/A;  . INTRAOPERATIVE TRANSESOPHAGEAL ECHOCARDIOGRAM N/A 07/18/2013   Procedure: INTRAOPERATIVE TRANSESOPHAGEAL ECHOCARDIOGRAM;  Surgeon: Grace Isaac, MD;  Location: [facility];  Service: Open Heart Surgery;  Laterality: N/A;  . LAPAROSCOPIC GASTRIC BANDING WITH HIATAL HERNIA REPAIR N/A 04/08/2013   Procedure: LAPAROSCOPIC GASTRIC BANDING WITH possible  HIATAL HERNIA REPAIR;  Surgeon: Gayland Curry, MD;  Location: [facility];  Service: General;  Laterality: N/A;  . LITHOTRIPSY    . renal calculi    . SUBXYPHOID PERICARDIAL WINDOW N/A 07/18/2013   Procedure: SUBXYPHOID PERICARDIAL WINDOW;  Surgeon: Grace Isaac, MD;  Location: [place];  Service: Thoracic;  Laterality: N/A;  . TONSILLECTOMY    . total knee replacment     bilat  . trigger finger repair     also lue, cts surgically repaired  . WISDOM TOOTH EXTRACTION      Family Psychiatric History: Reviewed.  Family History:  Mother passed away 55 years and 202 days on Dec 02, 2017 Dad died 1978/12/03  Family History  Problem Relation Age of Onset  . Depression Mother   . Hypertension Mother   . Dementia Mother   . Heart disease Father        MI  . Depression Brother   . Stroke Paternal Aunt        CVA  . Diabetes Paternal Uncle   . Heart disease Paternal Uncle        MI  . Heart disease Paternal Grandmother        MI  . Diabetes Cousin        PATERNAL    Social History:   Lives with wife of 90 years (2019) and 2 cats.   Social History   Socioeconomic History  . Marital status: Married    Spouse name: Not on file  . Number of children: 1  . Years of education: Not on file  . Highest education level: Not on file  Occupational History  . Occupation: Merchant navy officer  Social Needs  . Financial resource strain: Not on file  . Food insecurity:    Worry: Not on file    Inability: Not on file  . Transportation needs:    Medical: Not on file     Non-medical: Not on file  Tobacco Use  . Smoking status: Former Smoker    Packs/day: 0.20    Years: 13.00    Pack years: 2.60    Types: Cigarettes    Start date: 04/03/1961    Last attempt to quit: 08/30/1974    Years since quitting: 43.2  . Smokeless tobacco: Never Used  Substance and Sexual Activity  . Alcohol use: No    Alcohol/week: 0.0 standard drinks  . Drug use: No  . Sexual activity: Not Currently  Lifestyle  . Physical activity:  Days per week: Not on file    Minutes per session: Not on file  . Stress: Not on file  Relationships  . Social connections:    Talks on phone: Not on file    Gets together: Not on file    Attends religious service: Not on file    Active member of club or organization: Not on file    Attends meetings of clubs or organizations: Not on file    Relationship status: Not on file  Other Topics Concern  . Not on file  Social History Narrative   Lives at home w/ his wife   Right-handed   Caffeine: decaf coffee, occasional tea    Allergies:  Allergies  Allergen Reactions  . Morphine Other (See Comments)    Nausea and Severe hallucinations  . Codeine Nausea Only  . Penicillins Other (See Comments)    LOC with shot  . Propoxyphene N-Acetaminophen Nausea Only    Metabolic Disorder Labs: Lab Results  Component Value Date   HGBA1C 6.3 11/13/2017   MPG 120 06/19/2016   MPG 128 (H) 12/05/2012   No results found for: PROLACTIN Lab Results  Component Value Date   CHOL 151 11/13/2017   TRIG 283.0 (H) 11/13/2017   HDL 30.20 (L) 11/13/2017   CHOLHDL 5 11/13/2017   VLDL 56.6 (H) 11/13/2017   LDLCALC 77 06/19/2016   LDLCALC 81 04/26/2016   Lab Results  Component Value Date   TSH 4.50 11/13/2017   TSH 2.10 10/24/2016    Therapeutic Level Labs: No results found for: LITHIUM No results found for: VALPROATE No components found for:  CBMZ  Current Medications: Current Outpatient Medications  Medication Sig Dispense Refill  .  apixaban (ELIQUIS) 5 MG TABS tablet Take 1 tablet (5 mg total) by mouth 2 (two) times daily. 180 tablet 1  . atorvastatin (LIPITOR) 80 MG tablet TAKE 1 TABLET BY MOUTH EVERY DAY 90 tablet 1  . CALCIUM PO Take 500 mg by mouth 2 (two) times daily.     . Cyanocobalamin (VITAMIN B-12) 1000 MCG SUBL Place 5,000 mcg under the tongue every evening.     [name] Sodium (DSS) 100 MG CAPS Take 100 mg by mouth every other day.    . finasteride (PROSCAR) 5 MG tablet Take 1 tablet (5 mg total) by mouth daily. 30 tablet 1  . flecainide (TAMBOCOR) 50 MG tablet TAKE 1 TABLET BY MOUTH EVERY 12 HOURS 180 tablet 0  . loratadine (CLARITIN) 10 MG tablet Take 10 mg by mouth daily.    [name] LORazepam (ATIVAN) 0.5 MG tablet Take 1 tablet (0.5 mg total) by mouth at bedtime. 30 tablet 2  . montelukast (SINGULAIR) 10 MG tablet TAKE 1 TABLET BY MOUTH EVERYDAY AT BEDTIME 90 tablet 1  . pantoprazole (PROTONIX) 40 MG tablet TAKE 1 TABLET BY MOUTH EVERY DAY 90 tablet 1  . potassium chloride SA (KLOR-CON M20) 20 MEQ tablet Take 2 tablets (40 mEq total) by mouth daily. 180 tablet 3  . rOPINIRole (REQUIP) 0.5 MG tablet TAKE 1 TABLET BY MOUTH EVERYDAY AT BEDTIME 90 tablet 1  . sertraline (ZOLOFT) 100 MG tablet Take 1.5 tablets (150 mg total) by mouth daily. 45 tablet 2  . torsemide (DEMADEX) 20 MG tablet Take 3 tablets (60 mg total) by mouth daily. 90 tablet 1  . traMADol (ULTRAM) 50 MG tablet Take 1 tablet (50 mg total) by mouth 2 (two) times daily. (Patient taking differently: Take 50 mg by mouth as needed. )  30 tablet 0   No current facility-administered medications for this visit.      Musculoskeletal: Strength & Muscle Tone: decreased Gait & Station: walking with cane Patient leans: Right  Psychiatric Specialty Exam: Review of Systems  Constitutional: Positive for malaise/fatigue and weight loss (bariatric surgery 2015).       Weight gain Tired and exhausted  Respiratory: Positive for shortness of breath.    Cardiovascular: Positive for leg swelling.       A-fib  Gastrointestinal: Negative.   Musculoskeletal: Positive for back pain (pain blockers in back and scaitic nerve) and joint pain.  Neurological: Positive for dizziness (off balance) and tingling.   After bariatric surgery had lost down to 295#, but then had medical/cardiac complications.  He has gained back to 382# over the past 3 years, and is trying to lose slowly.  Blood pressure (!) 146/78, pulse 75, height 6' (1.829 m), weight (!) 382 lb (173.3 kg), SpO2 96 %.Body mass index is 51.81 kg/m.  General Appearance: Casual  Eye Contact:  Good  Speech:  Clear and Coherent and Slow  Volume:  Normal  Mood:  Anxious and Dysphoric  Affect:  Congruent  Thought Process:  Goal Directed  Orientation:  Full (Time, Place, and Person)  Thought Content: Logical, Hallucinations: None and Rumination   Suicidal Thoughts:  No  Homicidal Thoughts:  No  Memory:  Immediate;   Good Recent;   Good Remote;   Good  Judgement:  Good  Insight:  Present  Psychomotor Activity:  Decreased  Concentration:  Concentration: Fair and Attention Span: Fair  Recall:  Good  Fund of Knowledge: Good  Language: Good  Akathisia:  No  Handed:  Right  AIMS (if indicated): not done  Assets:  Communication Skills Desire for Improvement Housing Resilience Social Support  ADL's:  Intact  Cognition: WNL  Sleep:  Fair   Screenings: PHQ2-9     [facility] 07/28/2016 in Dr. Alysia [facility]- [facility] Visit from 03/30/2014 in [place] from 03/07/2013 in [place] at  [employer] Total Score  0  0  0  0  0  PHQ-9 Total Score  -  2  -  -  -       Assessment and Plan: Major depressive disorder, recurrent.  Anxiety disorder NOS. Grief  Patient has  generalized fatigue, tiredness and shortness of breath.  Recommended to see his primary care physician or his cardiologist.  Strongly advised to review CPAP with his sleep provider to ensure adequate control of OSA.  His depression and anxiety are under control, with recent increase related to grief with mother's death.  I will continue Zoloft 150 mg daily and lorazepam 0.5 mg at bedtime.  Patient is not interested in counseling.   There are other unrelated non-urgent complaints, but due to the busy schedule and the amount of time I've already spent with him, time does not permit me to address these routine issues at today's visit. I've requested another appointment to review these additional issues. Recommended to call us back if is any question, concern for feel worsening of the symptoms.  Follow-up in 3 months.   Lavella Hammock, MD 11/28/2017, 3:14 PM

## 2017-12-03 ENCOUNTER — Other Ambulatory Visit: Payer: Self-pay | Admitting: Physician Assistant

## 2017-12-04 NOTE — Telephone Encounter (Signed)
Rx sent to pharmacy   

## 2017-12-07 ENCOUNTER — Ambulatory Visit: Payer: Self-pay | Admitting: Physical Medicine & Rehabilitation

## 2017-12-14 ENCOUNTER — Ambulatory Visit: Payer: Self-pay

## 2017-12-14 NOTE — Telephone Encounter (Signed)
Patient called in with c/o "foot pain." He says "I woke up this morning and my left foot was hurting. [place] cane and now I can hardly walk it hurts so bad. I have chronic swelling and numbness to my feet, but I feel like it is worse because I can't bend my ankle. The pain is about a 7. I'm wondering if I injured it somehow or if I have a blood clot." I asked about leg pain, swelling, he denies. I asked about other symptoms, he denies. According to protocol, see PCP within 24 hours, advised patient to come to the [facility] for evaluation, he agrees, appointment scheduled for tomorrow at 0945 with Dr. Owens Loffler, care advice given, patient verbalized understanding.   Reason for Disposition . [1] Swollen foot AND [2] no fever  (Exceptions: localized bump from bunions, calluses, insect bite, sting)  Answer Assessment - Initial Assessment Questions 1. ONSET: "When did the pain start?"      This morning 2. LOCATION: "Where is the pain located?"      Left foot 3. PAIN: "How bad is the pain?"    (Scale 1-10; or mild, moderate, severe)   -  MILD (1-3): doesn't interfere with normal activities    -  MODERATE (4-7): interferes with normal activities (e.g., work or school) or awakens from sleep, limping    -  SEVERE (8-10): excruciating pain, unable to do any normal activities, unable to walk     7-can't hardly walk 4. WORK OR EXERCISE: "Has there been any recent work or exercise that involved this part of the body?"      No 5. CAUSE: "What do you think is causing the foot pain?"     I don't know, but I'm wondering if it's a blood clot 6. OTHER SYMPTOMS: "Do you have any other symptoms?" (e.g., leg pain, rash, fever, numbness)     No 7. PREGNANCY: "Is there any chance you are pregnant?" "When was your last menstrual period?"     N/A  Protocols used: FOOT PAIN-A-AH

## 2017-12-15 ENCOUNTER — Ambulatory Visit (INDEPENDENT_AMBULATORY_CARE_PROVIDER_SITE_OTHER): Payer: Medicare Other | Admitting: Family Medicine

## 2017-12-15 ENCOUNTER — Encounter: Payer: Self-pay | Admitting: Family Medicine

## 2017-12-15 VITALS — BP 130/82 | HR 69 | Temp 98.2°F | Wt 381.0 lb

## 2017-12-15 DIAGNOSIS — G463 Brain stem stroke syndrome: Secondary | ICD-10-CM | POA: Diagnosis not present

## 2017-12-15 DIAGNOSIS — M76822 Posterior tibial tendinitis, left leg: Secondary | ICD-10-CM

## 2017-12-15 DIAGNOSIS — M7672 Peroneal tendinitis, left leg: Secondary | ICD-10-CM | POA: Diagnosis not present

## 2017-12-15 NOTE — Progress Notes (Signed)
[name] [place], [place] Phone: [phone]  Patient ID: [name] MRN: [id number], DOB: 20-Oct-1945, 72 y.o. Date of Encounter: 12/15/2017  Primary Physician:  Janith Lima, MD   Chief Complaint:  Chief Complaint  Patient presents with  . Left Ankle Pain    x 1 day... pt reports he did a lot of walking yesterday and it aggravated... describes the pain as sharp and achy... pt has not tried oral meds... Hx of PE and 2 strokes...   Subjective:   History of Present Illness:  [name] is a 72 y.o. pleasant patient who presents with the following:  L foot and ankle were hurting yesterday. Does have a history of 4 PE and CVA x 2.   Very pleasant gentleman Body mass index is 51.67 kg/m.  Who presents after walking quite a bit yesterday, and he was having some pain in his foot.  His pain is really at the ankle and he had some mild swelling.  He was worried due to the fact that he has had multiple medical problems in the past but this may be something more severe.  He feels better this morning.  The PMH, PSH, Social History, Family History, Medications, and allergies have been reviewed in [name], and have been updated if relevant.  Review of Systems:  GEN: No fevers, chills. Nontoxic. Primarily MSK c/o today. MSK: Detailed in the HPI GI: tolerating PO intake without difficulty Neuro: No numbness, parasthesias, or tingling associated. Otherwise the pertinent positives of the ROS are noted above.    Objective:   Physical Examination: BP 130/82   Pulse 69   Temp 98.2 F (36.8 C) (Oral)   Wt (!) 381 lb (172.8 kg)   SpO2 97%   BMI 51.67 kg/m    GEN: WDWN, NAD, Non-toxic, A & O x 3 HEENT: Atraumatic, Normocephalic. Neck supple. No masses, No LAD. Ears and Nose: No external deformity. CV: RRR, No M/G/R. No JVD. No thrill. No extra heart sounds. PULM: CTA B, no wheezes, crackles,  rhonchi. No retractions. No resp. distress. No accessory muscle use. EXTR: No c/c/tr edema le b [name] is negative  NEURO Normal gait.  PSYCH: Normally interactive. Conversant. Not depressed or anxious appearing.  Calm demeanor.   Full range of motion at the ankle bilaterally with strength 5/5.  Nontender at the medial malleoli, lateral malleoli.  Nontender at the CFL, ATFL, or deltoid ligaments. He does have some modest tenderness at the peroneal as well as posterior tibialis tendons.  There is no significant tenderness at the midfoot or forefoot including all bony anatomy.  Laboratory and Imaging Data:  Assessment & Plan:   Peroneal tendonitis, left  Tibialis posterior tendinitis, left  Reassured the patient that this could not be any form of clot.  He could have some overuse of his tendon support structures in his left foot while walking more yesterday than he typically does.  He has a ASO type ankle brace at home that he is going to use over the next few days and ice his ankle.  Follow-up: No follow-ups on file.  Signed,  Maud Deed. Nhi Butrum, MD   Allergies as of 12/15/2017      Reactions   Morphine Other (See Comments)   Nausea and Severe hallucinations   Codeine Nausea Only   Penicillins Other (See Comments)   LOC with shot   Propoxyphene N-acetaminophen Nausea Only  Medication List        Accurate as of 12/15/17 11:59 PM. Always use your most recent med list.          apixaban 5 MG Tabs tablet Commonly known as:  ELIQUIS Take 1 tablet (5 mg total) by mouth 2 (two) times daily.   atorvastatin 80 MG tablet Commonly known as:  LIPITOR TAKE 1 TABLET BY MOUTH EVERY DAY   CALCIUM PO Take 500 mg by mouth 2 (two) times daily.   DSS 100 MG Caps Take 100 mg by mouth every other day.   finasteride 5 MG tablet Commonly known as:  PROSCAR Take 1 tablet (5 mg total) by mouth daily.   flecainide 50 MG tablet Commonly known as:  TAMBOCOR TAKE 1 TABLET BY  MOUTH EVERY 12 HOURS   loratadine 10 MG tablet Commonly known as:  CLARITIN Take 10 mg by mouth daily.   LORazepam 0.5 MG tablet Commonly known as:  ATIVAN Take 1 tablet (0.5 mg total) by mouth at bedtime.   montelukast 10 MG tablet Commonly known as:  SINGULAIR TAKE 1 TABLET BY MOUTH EVERYDAY AT BEDTIME   pantoprazole 40 MG tablet Commonly known as:  PROTONIX TAKE 1 TABLET BY MOUTH EVERY DAY   potassium chloride SA 20 MEQ tablet Commonly known as:  K-DUR,KLOR-CON Take 2 tablets (40 mEq total) by mouth daily.   rOPINIRole 0.5 MG tablet Commonly known as:  REQUIP TAKE 1 TABLET BY MOUTH EVERYDAY AT BEDTIME   sertraline 100 MG tablet Commonly known as:  ZOLOFT Take 1.5 tablets (150 mg total) by mouth daily.   torsemide 20 MG tablet Commonly known as:  DEMADEX Take 3 tablets (60 mg total) by mouth daily. Keep appointment as scheduled.   traMADol 50 MG tablet Commonly known as:  ULTRAM Take 1 tablet (50 mg total) by mouth 2 (two) times daily.   Vitamin B-12 1000 MCG Subl Place 5,000 mcg under the tongue every evening.

## 2017-12-16 ENCOUNTER — Encounter: Payer: Self-pay | Admitting: Family Medicine

## 2017-12-18 ENCOUNTER — Encounter: Payer: Self-pay | Admitting: Physical Medicine & Rehabilitation

## 2017-12-18 ENCOUNTER — Encounter: Payer: Medicare Other | Attending: Physical Medicine & Rehabilitation

## 2017-12-18 ENCOUNTER — Ambulatory Visit (HOSPITAL_BASED_OUTPATIENT_CLINIC_OR_DEPARTMENT_OTHER): Payer: Medicare Other | Admitting: Physical Medicine & Rehabilitation

## 2017-12-18 VITALS — BP 136/83 | HR 61 | Ht 72.0 in | Wt 378.0 lb

## 2017-12-18 DIAGNOSIS — G463 Brain stem stroke syndrome: Secondary | ICD-10-CM | POA: Diagnosis not present

## 2017-12-18 DIAGNOSIS — M47817 Spondylosis without myelopathy or radiculopathy, lumbosacral region: Secondary | ICD-10-CM

## 2017-12-18 DIAGNOSIS — M533 Sacrococcygeal disorders, not elsewhere classified: Secondary | ICD-10-CM | POA: Insufficient documentation

## 2017-12-18 DIAGNOSIS — I480 Paroxysmal atrial fibrillation: Secondary | ICD-10-CM

## 2017-12-18 DIAGNOSIS — I5032 Chronic diastolic (congestive) heart failure: Secondary | ICD-10-CM

## 2017-12-18 NOTE — Patient Instructions (Addendum)
Will repeat medial branch blocks next visit to see whether the radiofrequency procedure may be of benefit.

## 2017-12-18 NOTE — Progress Notes (Signed)
Subjective:    Patient ID: [name], male    DOB: 1945/11/07, 72 y.o.   MRN: [id number] 72 year old right-handed male with history of atrial fibrillation, pulmonary emboli, maintained on Eliquis, as well as history of hypertension, obstructive sleep apnea, diastolic congestive heart failure. He lives with spouse independent with a cane and a walker prior to admission. One-level home. Presented June 19, 2016, with headache, sudden onset while watching television leaning to the left side as well as slurred speech. Cranial CT scan negative. He did not receive tPA. 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 No proximal occlusion. No significant stenosis or dissection of carotid arteries. MRI showed a small left medullary infarct as well as LV4 occlusion. Interventional Radiology consulted, no need for emergent endovascular treatment. Echocardiogram with ejection fraction 25%, grade 1 diastolic dysfunction.  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  He had a good recovery from his stroke but continues to walk with a cane.  The patient has additional comorbidities of morbid obesity, diastolic congestive heart failure, sleep apnea, atrial fibrillation and history of PE. His major limitation recently has been his low back pain.  He has had this for a number of years but has worsened over the last year or 2. Was down to 295lb after bariatric surgery but is now up to 378 pounds with a BMI of 51 ~50% pain relief for 1 wk post L3-4 Medial branch and L5 dorsal ramus injeciton performed 11/08/17  Standing tolerance improved from ~2 min to 5-67min postinjection  Social: Patient's wife recently had what sounds like a vertebral compression fracture and is in a back  brace.  He has been having to do a little bit more around the house. He also complains of mobility in and out of the car.  He is wondering whether he would qualify for a electric wheelchair.  He does use a cane to go to and from the handicap parking and inside the house  We discussed the importance of exercise.  We discussed just getting to the habit of doing some daily exercise is very important.  Even if he goes outside for 2 minutes a day for the first week walking up by 1 minute/day/week will eventually build some endurance and exercise tolerance. We discussed that his echocardiogram showed good systolic function.  His shortness of breath is likely related to deconditioning as well as morbid obesity but I encouraged him to discuss that symptom with his cardiologist next month.  Pain Inventory Average Pain 7 Pain Right Now 8 My pain is sharp and aching  In the last 24 hours, has pain interfered with the following? General activity 7 Relation with others 3 Enjoyment of life 7 What TIME of day is your pain at its worst? morning and daytime Sleep (in general) Good  Pain is worse with: walking, bending, standing and some activites Pain improves with: rest and medication Relief from Meds: 6  Mobility walk with assistance use a cane use a walker how many minutes can you walk? 5 ability to climb steps?  yes do you drive?  yes use a wheelchair Do you have any goals in this area?  yes  Function retired I need assistance with the following:  household duties and shopping  Neuro/Psych weakness numbness tingling trouble walking  dizziness loss of taste or smell  Prior Studies Any changes since last visit?  no  Physicians involved in your care Any changes since last visit?  no   Family History  Problem Relation Age of Onset  . Depression Mother   . Hypertension Mother   . Dementia Mother   . Heart disease Father        MI  . Depression Brother   . Stroke Paternal Aunt         CVA  . Diabetes Paternal Uncle   . Heart disease Paternal Uncle        MI  . Heart disease Paternal Grandmother        MI  . Diabetes Cousin        PATERNAL   Social History   Socioeconomic History  . Marital status: Married    Spouse name: Not on file  . Number of children: 1  . Years of education: Not on file  . Highest education level: Not on file  Occupational History  . Occupation: Merchant [employer] officer  Social Needs  . Financial resource strain: Not on file  . Food insecurity:    Worry: Not on file    Inability: Not on file  . Transportation needs:    Medical: Not on file    Non-medical: Not on file  Tobacco Use  . Smoking status: Former Smoker    Packs/day: 0.20    Years: 13.00    Pack years: 2.60    Types: Cigarettes    Start date: 04/03/1961    Last attempt to quit: 08/30/1974    Years since quitting: 43.3  . Smokeless tobacco: Never Used  Substance and Sexual Activity  . Alcohol use: No    Alcohol/week: 0.0 standard drinks  . Drug use: No  . Sexual activity: Not Currently  Lifestyle  . Physical activity:    Days per week: Not on file    Minutes per session: Not on file  . Stress: Not on file  Relationships  . Social connections:    Talks on phone: Not on file    Gets together: Not on file    Attends religious service: Not on file    Active member of club or organization: Not on file    Attends meetings of clubs or organizations: Not on file    Relationship status: Not on file  Other Topics Concern  . Not on file  Social History Narrative   Lives at home w/ his wife   Right-handed   Caffeine: decaf coffee, occasional tea   Past Surgical History:  Procedure Laterality Date  . ARTHROSCOPY KNEE W/ DRILLING    . BREATH TEK H PYLORI N/A 12/23/2012   Procedure: BREATH TEK H PYLORI;  Surgeon: Gayland Curry, MD;  Location: [facility];  Service: General;  Laterality: N/A;  . CHOLECYSTECTOMY  1989  . COLONOSCOPY N/A 07/31/2013   Procedure:  COLONOSCOPY;  Surgeon: Gatha Mayer, MD;  Location: [facility];  Service: Endoscopy;  Laterality: N/A;  . ESOPHAGOGASTRODUODENOSCOPY N/A 07/22/2013   Procedure: ESOPHAGOGASTRODUODENOSCOPY (EGD);  Surgeon: Irene Shipper, MD;  Location[facility] END[facility]COPY;  Service: Endoscopy;  Laterality: N/A;  . ESOPHAGOGASTRODUODENOSCOPY N/A 07/31/2013   Procedure: ESOPHAGOGASTRODUODENOSCOPY (EGD);  Surgeon: Gatha Mayer, MD;  Location: [facility] ENDOSCOPY;  Service: Endoscopy;  Laterality: N/A;  . INTRAOPERATIVE TRANSESOPHAGEAL ECHOCARDIOGRAM N/A 07/18/2013   Procedure: INTRAOPERATIVE TRANSESOPHAGEAL ECHOCARDIOGRAM;  Surgeon: Grace Isaac, MD;  Location: [facility];  Service: Open Heart Surgery;  Laterality: N/A;  . LAPAROSCOPIC GASTRIC BANDING WITH HIATAL HERNIA REPAIR N/A 04/08/2013   Procedure: LAPAROSCOPIC GASTRIC BANDING WITH possible  HIATAL HERNIA REPAIR;  Surgeon: Gayland Curry, MD;  Location: [facility];  Service: General;  Laterality: N/A;  . LITHOTRIPSY    . renal calculi    . SUBXYPHOID PERICARDIAL WINDOW N/A 07/18/2013   Procedure: SUBXYPHOID PERICARDIAL WINDOW;  Surgeon: Grace Isaac, MD;  Location: [facility];  Service: Thoracic;  Laterality: N/A;  . TONSILLECTOMY    . total knee replacment     bilat  . trigger finger repair     also lue, cts surgically repaired  . WISDOM TOOTH EXTRACTION     Past Medical History:  Diagnosis Date  . Anemia 07/29/2013  . Asthma    childhood asthma  . Benign essential HTN   . Benign prostatic hypertrophy    several prostate biopsies neg for cancer.   . Bilateral hip pain   . Cancer ([place])   . CHF (congestive heart failure) ([place])   . Chronic anticoagulation 07/22/2013  . Complication of anesthesia    MORPHINE CAUSES HALLUCINATIONS  . CVA (cerebral vascular accident) ([place]) 06/2016  . Depression   . Diastolic congestive heart failure ([place]) 07/16/2013  . Dysphagia, post-stroke   . Embolic cerebral infarction ([place]) 06/21/2016  . FH: colonic polyps 2010  . Gout   . History  of kidney stones   . Hypertension   . Kidney cysts   . Morbid obesity ([place])   . Numbness    feet / legs  . PAF (paroxysmal atrial fibrillation) with RVR 07/29/2013  . Recurrent pulmonary emboli ([place]) 06/13/2015  . Sleep apnea    USES C PAP  . Stroke syndrome   . 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   BP 136/83   Pulse 61   Ht 6' (1.829 m)   Wt (!) 378 lb (171.5 kg)   SpO2 95%   BMI 51.27 kg/m   Opioid Risk Score:   Fall Risk Score:  `1  Depression screen PHQ 2/9  Depression screen [facility] 2/9 10/01/2017 07/28/2016 02/16/2015  Decreased Interest 0 0 0  Down, Depressed, Hopeless 0 0 0  PHQ - 2 Score 0 0 0  Altered sleeping - 0 -  Tired, decreased energy - 1 -  Change in appetite - 0 -  Feeling bad or failure about yourself  - 0 -  Trouble concentrating - 1 -  Moving slowly or fidgety/restless - 0 -  PHQ-9 Score - 2 -  Difficult doing work/chores - Not difficult at all -  Some recent data might be hidden    Review of Systems  Constitutional: Negative.   HENT: Negative.   Eyes: Negative.   Respiratory: Positive for apnea, shortness of breath and wheezing.   Cardiovascular: Negative.   Gastrointestinal: Negative.   Endocrine: Negative.   Genitourinary: Negative.   Musculoskeletal: Negative.   Skin: Negative.   Allergic/Immunologic: Negative.   Neurological: Negative.   Hematological: Negative.   Psychiatric/Behavioral: Negative.   All other systems reviewed and are negative.      Objective:   Physical Exam  Constitutional: He is oriented to person, place, and time. He appears well-developed and well-nourished.  BMI 51  HENT:  Head: Normocephalic and atraumatic.  Eyes: Pupils are equal, round, and reactive to light. EOM are normal.  Neck: Normal range of motion.  Musculoskeletal:  Lumbar spine has 50% range with forward flexion, 0 degrees of extension he cannot get to a neutral  but stays about 5 degrees flexed in the lumbar spine due to discomfort. Negative straight  leg raising Mild tenderness palpation at the L5 and S1 paraspinal area.  Neurological: He is alert and oriented to person, place, and time. Gait abnormal.  No evidence of dysarthria or dysphonia No evidence of dysmetria finger-nose-finger Sensation intact light touch bilateral upper and lower limbs Gait shows no evidence of toe drag or knee instability he does use a cane widened base of support  Motor strength is 5/5 bilateral deltoid, bicep, tricep, grip, hip flexor and, ankle dorsiflexion plantar flexor, knee extensor  Nursing note and vitals reviewed.   1+ pretibial edema      Assessment & Plan:  1.  Lumbar spondylosis without myelopathy he has primarily axial low back pain and poor standing tolerance due to pain. We discussed that the lumbar medial branch blocks are not a long-term treatment but can help predict response to radiofrequency neurotomy.  We discussed recommendation to repeat medial branch blocks L3-L4 as well as bilateral L5 dorsal ramus injection to see whether he would be a candidate for radiofrequency neurotomy. We will schedule this in about 2 weeks We discussed weight loss as it pertains to his mobility.  He does have a diet from general surgery that he was to follow after weight reduction surgery.  Instead he has been eating out at restaurants frequently. We discussed gradually increasing his walking tolerance.  2 minutes daily going up by 1 minute/day/week He will follow-up with cardiology in regards to his shortness of breath. Over half of the 25 min visit was spent counseling and coordinating care.

## 2017-12-27 ENCOUNTER — Other Ambulatory Visit: Payer: Self-pay | Admitting: Internal Medicine

## 2018-01-01 ENCOUNTER — Other Ambulatory Visit: Payer: Self-pay | Admitting: Internal Medicine

## 2018-01-03 ENCOUNTER — Encounter: Payer: Self-pay | Admitting: Physical Medicine & Rehabilitation

## 2018-01-03 ENCOUNTER — Encounter: Payer: Medicare Other | Attending: Physical Medicine & Rehabilitation

## 2018-01-03 ENCOUNTER — Ambulatory Visit (HOSPITAL_BASED_OUTPATIENT_CLINIC_OR_DEPARTMENT_OTHER): Payer: Medicare Other | Admitting: Physical Medicine & Rehabilitation

## 2018-01-03 VITALS — BP 110/72 | HR 59 | Resp 14 | Ht 72.0 in | Wt 378.0 lb

## 2018-01-03 DIAGNOSIS — Z96653 Presence of artificial knee joint, bilateral: Secondary | ICD-10-CM | POA: Insufficient documentation

## 2018-01-03 DIAGNOSIS — Z6841 Body Mass Index (BMI) 40.0 and over, adult: Secondary | ICD-10-CM | POA: Diagnosis not present

## 2018-01-03 DIAGNOSIS — I110 Hypertensive heart disease with heart failure: Secondary | ICD-10-CM | POA: Diagnosis not present

## 2018-01-03 DIAGNOSIS — I69398 Other sequelae of cerebral infarction: Secondary | ICD-10-CM | POA: Insufficient documentation

## 2018-01-03 DIAGNOSIS — Z885 Allergy status to narcotic agent status: Secondary | ICD-10-CM | POA: Insufficient documentation

## 2018-01-03 DIAGNOSIS — M47816 Spondylosis without myelopathy or radiculopathy, lumbar region: Secondary | ICD-10-CM | POA: Insufficient documentation

## 2018-01-03 DIAGNOSIS — R269 Unspecified abnormalities of gait and mobility: Secondary | ICD-10-CM | POA: Diagnosis not present

## 2018-01-03 DIAGNOSIS — Z87442 Personal history of urinary calculi: Secondary | ICD-10-CM | POA: Insufficient documentation

## 2018-01-03 DIAGNOSIS — M47817 Spondylosis without myelopathy or radiculopathy, lumbosacral region: Secondary | ICD-10-CM

## 2018-01-03 DIAGNOSIS — Z886 Allergy status to analgesic agent status: Secondary | ICD-10-CM | POA: Insufficient documentation

## 2018-01-03 DIAGNOSIS — I480 Paroxysmal atrial fibrillation: Secondary | ICD-10-CM | POA: Diagnosis not present

## 2018-01-03 DIAGNOSIS — F329 Major depressive disorder, single episode, unspecified: Secondary | ICD-10-CM | POA: Insufficient documentation

## 2018-01-03 DIAGNOSIS — Z8249 Family history of ischemic heart disease and other diseases of the circulatory system: Secondary | ICD-10-CM | POA: Insufficient documentation

## 2018-01-03 DIAGNOSIS — Z880 Allergy status to penicillin: Secondary | ICD-10-CM | POA: Insufficient documentation

## 2018-01-03 DIAGNOSIS — Z87891 Personal history of nicotine dependence: Secondary | ICD-10-CM | POA: Diagnosis not present

## 2018-01-03 DIAGNOSIS — I5030 Unspecified diastolic (congestive) heart failure: Secondary | ICD-10-CM | POA: Insufficient documentation

## 2018-01-03 DIAGNOSIS — M533 Sacrococcygeal disorders, not elsewhere classified: Secondary | ICD-10-CM | POA: Diagnosis not present

## 2018-01-03 DIAGNOSIS — G4730 Sleep apnea, unspecified: Secondary | ICD-10-CM | POA: Diagnosis not present

## 2018-01-03 DIAGNOSIS — Z86711 Personal history of pulmonary embolism: Secondary | ICD-10-CM | POA: Insufficient documentation

## 2018-01-03 DIAGNOSIS — Z818 Family history of other mental and behavioral disorders: Secondary | ICD-10-CM | POA: Insufficient documentation

## 2018-01-03 DIAGNOSIS — M545 Low back pain: Secondary | ICD-10-CM

## 2018-01-03 NOTE — Progress Notes (Addendum)
Bilateral Lumbar  L4  medial branch blocks and L 5 dorsal ramus injection under fluoroscopic guidance  Indication: Lumbar pain which is not relieved by medication management or other conservative care and interfering with self-care and mobility. Axial back pain that increases with lumbar extension that has not responded to medication management or other conservative care.  Pain has been ongoing for more than one year.  Physical exam demonstrates increased lumbar pain with extension.  Due to CVA pt not a candidate for NSAID meds.    Informed consent was obtained after describing risks and benefits of the procedure with the patient, this includes bleeding, infection, paralysis and medication side effects.  The patient wishes to proceed and has given written consent.  The patient was placed in prone position.  The lumbar area was marked and prepped with Betadine.  One mL of 1% lidocaine was injected into each of 6 areas into the skin and subcutaneous tissue.  Then a 22-gauge 5in spinal needle was inserted targeting the junction of the left S1 superior articular process and sacral ala junction. Needle was advanced under fluoroscopic guidance.  Bone contact was made.  Isovue 200 was injected x 0.5 mL demonstrating no intravascular uptake.  Then a solution  of 2% MPF lidocaine was injected x 0.5 mL.  Then the left L5 superior articular process in transverse process junction was targeted.  Bone contact was made.  Isovue 200 was injected x 0.5 mL demonstrating no intravascular uptake. Then a solution containing  2% MPF lidocaine was injected x 0.5 mL. This same procedure was performed on the right side using the same needle, technique and injectate.  Patient tolerated procedure well.  Post procedure instructions were given.

## 2018-01-03 NOTE — Progress Notes (Signed)
  [facility]   Name: MATVEY LLANAS DOB:09-Feb-1946 MRN: [id number]  Date:01/03/2018  Physician: Alysia Penna, MD    Nurse/CMA: Porschea Borys, CMA  Allergies:  Allergies  Allergen Reactions  . Morphine Other (See Comments)    Nausea and Severe hallucinations  . Codeine Nausea Only  . Penicillins Other (See Comments)    LOC with shot  . Propoxyphene N-Acetaminophen Nausea Only    Consent Signed: Yes.    Is patient diabetic? No.  CBG today?   Pregnant: No. LMP: No LMP for male patient. (age 90-55)  Anticoagulants: yes (eliquis) Anti-inflammatory: no Antibiotics: no  Procedure: Bilateral L3,4,5 medial branch block  Position: Prone Start Time: 1:23pm  End Time:    Fluoro Time:   RN/CMA Shanina Kepple, CMA Romari Gasparro, CMA    Time 12:50pm 1:47pm    BP 110/72 133/69    Pulse 59 59    Respirations 16 16    O2 Sat 95 94    S/S 6 6    Pain Level 6/10 4/10     D/C home with wife, patient A & O X 3, D/C instructions reviewed, and sits independently.

## 2018-01-03 NOTE — Patient Instructions (Signed)

## 2018-01-08 ENCOUNTER — Other Ambulatory Visit: Payer: Self-pay | Admitting: Internal Medicine

## 2018-01-09 ENCOUNTER — Ambulatory Visit (INDEPENDENT_AMBULATORY_CARE_PROVIDER_SITE_OTHER): Payer: Medicare Other | Admitting: Internal Medicine

## 2018-01-09 ENCOUNTER — Encounter: Payer: Self-pay | Admitting: Internal Medicine

## 2018-01-09 VITALS — BP 123/79 | HR 73 | Ht 72.0 in | Wt 377.2 lb

## 2018-01-09 DIAGNOSIS — R0609 Other forms of dyspnea: Secondary | ICD-10-CM | POA: Diagnosis not present

## 2018-01-09 DIAGNOSIS — I5032 Chronic diastolic (congestive) heart failure: Secondary | ICD-10-CM

## 2018-01-09 DIAGNOSIS — I480 Paroxysmal atrial fibrillation: Secondary | ICD-10-CM

## 2018-01-09 DIAGNOSIS — R0600 Dyspnea, unspecified: Secondary | ICD-10-CM

## 2018-01-09 DIAGNOSIS — G463 Brain stem stroke syndrome: Secondary | ICD-10-CM

## 2018-01-09 NOTE — Progress Notes (Signed)
OFFICE NOTE  Chief Complaint:  Follow-up dyspnea  Primary Care Physician: Janith Lima, MD  HPI:  [name] is a 72 y/o male with a history of bariatric surgery, one year ago, lost about 85 pounds before and after surgery. Also has a history of hypertension and sleep apnea. He has been admitted twice in the last month with an unusual presentation with multiple medical problems, including pulmonary embolus, moderate to large pericardial effusion, new onset a-fib and GI bleed. His PE was diagnosed by his PCP through outpatient w/u and he was placed on Xarelto. His first hospital admission was from 4/15-4/24. At that time his CC was chest pain. W/u suggested a diagnosis of pericarditis. An Echocardiogram was done, which showed moderate to large pericardial effusion. Meanwhile the night of 4/16 he went in to afib with RVR, probably secondary to PE and was transferred to step down and started on cardizem drip. He converted to sinus the same night , cardizem discontinued and he was resumed on metoprolol. CT surgery was consulted for pericardial window. He underwent pericardial window on 4/17 and hemorrhagic fluid was drained and was sent for analysis. Gram stain and cultures were negative. He was also started on colchicine for his pericarditis. The evening of 4/20 pt went back in to afib with RVR and had bloody bowel movement. His xarelto was discontinued and he was put on cardizem gtt. Over the next 24 hours his hemoglobin dropped to 7.8 and he received 2 units of prbc transfusion. His repeat H&H improved. Dr Henrene Pastor was consulted from GI on 4/20 and he underwent endoscopy on 4/21 . He was found to have superficial ulcers in the stomach, underwent biopsy of the area. He was recommended to continue on PPI twice daily for 2 weeks and resume xarelto. He was discharged home then returned 3 days later on 07/28/13 with a complaint of increased fatigue and SOB. Repeat 2D echo demonstrated only minimal  residual effusion with normal RV and LV function. 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 He was admitted and underwent a repeat EGD which showed healing of ulcers, no bleed. He was continued on PO iron and a PPI. His Hgb at time of discharge was stable at 9.0. He was discharged home. Cardiology had recommended an OP stress test. This was done 08/03/13 and showed normal myocardial perfusion and normal LV EF of 58%.  He returns today and has reported a marked improvement in his shortness of breath. He is able to do most activities without any difficulty. He continues on Eliquis for his pulmonary embolus and for stroke prevention. He is on flecainide for antiarrhythmic therapy. He denies any chest pain. His only complaint is fatigue. He reports some fatigue and notices it worse as he has been working on refurbishing a house that his mother used to own to sell. He reports his sleep is good at night however he does have difficulty falling asleep which may be playing a role in his fatigue.  [name] returns today for follow-up. He reports no significant difference in his symptoms. He gets some mild shortness of breath with exercise which she does very infrequently. He also reports leg swelling which is persistent. Weight has been fairly stable although could stand to go down. We talked about ways he could work on weight loss. He does have a history of lap band in the past however he is not been able to lose anymore weight and in fact  has gained it back. He denies any recurrent palpitation or A. Fib that he is aware of. He continues on flecainide which he is tolerating without any difficulty.  04/28/2016  [name] was seen back today in follow-up. He continues to complain of shortness of breath, persistent with exertion. Weight is now up from 337-350 pounds. He is celebrating the birth of this newest grandchild and wants to be more active and is frustrated that he is not able to do much activity  without shortness of breath. He is on flecainide without any recurrent atrial fibrillation. His last stress test was more than 2 years ago and he is due for repeat stress test.  05/23/2016  [name] returns today for follow-up. He is unfortunately gained another few pounds although he felt that this is related to stuff in his pockets. His stress test was negative for ischemia. It is noted that his blood pressure is high again today 168/89. He was previously elevated over 150. He does have a history of hypertension and was on Lotrel in the past but was taken off of that due to hypotension after significant weight loss that occurred after lap band surgery. Unfortunately he has gained back most of that weight and likely is hypertensive secondary to that.  11/22/2016  [name] was seen today in follow-up. He continues to have some progressive shortness of breath with exertion. 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 I wonder if this is playing a role in upper airway obstruction which could be causing his shortness of breath. Blood pressure appears to be better controlled today although he was taken off of blood pressure medications when he had a stroke. He was also previously on a number of inhalers including Symbicort and Breo Ellipta - these have also been discontinued.  02/20/2017  [name] returns today for follow-up.  I referred him to pulmonary for evaluation of his dyspnea.  He had no improvement with inhalers and underwent repeat pulmonary function testing by Dr. Lake Bells, which failed to reveal any significant obstructive pulmonary disease.  It was felt that he might be volume overloaded and he was switched from Lasix to torsemide.  It appears the dose was equivalent.  He is not diuresed anymore and says that he is not more short of breath however we will need to recheck his metabolic profile to ensure  he is not being over diuresed.  With regards to vocal cord dysfunction, he has follow-up at [facility] for evaluation of possible surgery.  It is not clear whether this will improve his shortness of breath not and I advised him to ask his surgeon whether the surgery would help more with his breathing or phonation.  01/09/2018  [name] is seen today in follow-up.  Overall he continues to have shortness of breath.  Weight is still significant issue.  He is essentially gained back all the weight that he lost after weight loss surgery.  He was recently seen by Rosaria Ferries, PA-C.  Who noted that his weighted maintained a stable level on torsemide 60 mg daily.  This was previously increased by his PCP.  He continues to keep off the edema but has had no significant change in his shortness of breath.  He is been working with Dr. Letta Pate in [facility], who has been giving him pain injections in his back.  Recently this is been helpful for him.  PMHx:  Past Medical History:  Diagnosis Date  . Anemia 07/29/2013  . Asthma    childhood asthma  . Benign essential HTN   . Benign prostatic hypertrophy    several prostate biopsies neg for cancer.   . Bilateral hip pain   . Cancer ([place])   . CHF (congestive heart failure) ([place])   . Chronic anticoagulation 07/22/2013  . Complication of anesthesia    MORPHINE CAUSES HALLUCINATIONS  . CVA (cerebral vascular accident) ([place]) 06/2016  . Depression   . Diastolic congestive heart failure ([place]) 07/16/2013  . Dysphagia, post-stroke   . Embolic cerebral infarction ([place]) 06/21/2016  . FH: colonic polyps 2010  . Gout   . History of kidney stones   . Hypertension   . Kidney cysts   . Morbid obesity ([place])   . Numbness    feet / legs  . PAF (paroxysmal atrial fibrillation) with RVR 07/29/2013  . Recurrent pulmonary emboli ([place]) 06/13/2015  . Sleep apnea    USES C PAP  . Stroke syndrome   . 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 ARTHROSCOPY KNEE W/ DRILLING    . BREATH TEK H PYLORI N/A 12/23/2012   Procedure: BREATH TEK H PYLORI;  Surgeon: Gayland Curry, MD;  Location: [facility];  Service: General;  Laterality: N/A;  . CHOLECYSTECTOMY  1989  . COLONOSCOPY N/A 07/31/2013   Procedure: COLONOSCOPY;  Surgeon: Gatha Mayer, MD;  Location: [place];  Service: Endoscopy;  Laterality: N/A;  . ESOPHAGOGASTRODUODENOSCOPY N/A 07/22/2013   Procedure: ESOPHAGOGASTRODUODENOSCOPY (EGD);  Surgeon: Irene Shipper, MD;  Location: [facility] ENDOSCOPY;  Service: Endoscopy;  Laterality: N/A;  . ESOPHAGOGASTRODUODENOSCOPY N/A 07/31/2013   Procedure: ESOPHAGOGASTRODUODENOSCOPY (EGD);  Surgeon: Gatha Mayer, MD;  Location: [facility] ENDOSCOPY;  Service: Endoscopy;  Laterality: N/A;  . INTRAOPERATIVE TRANSESOPHAGEAL ECHOCARDIOGRAM N/A 07/18/2013   Procedure: INTRAOPERATIVE TRANSESOPHAGEAL ECHOCARDIOGRAM;  Surgeon: Grace Isaac, MD;  Location: [place];  Service: Open Heart Surgery;  Laterality: N/A;  . LAPAROSCOPIC GASTRIC BANDING WITH HIATAL HERNIA REPAIR N/A 04/08/2013   Procedure: LAPAROSCOPIC GASTRIC BANDING WITH possible  HIATAL HERNIA REPAIR;  Surgeon: Gayland Curry, MD;  Location: [facility];  Service: General;  Laterality: N/A;  . LITHOTRIPSY    . renal calculi    . SUBXYPHOID PERICARDIAL WINDOW N/A 07/18/2013   Procedure: SUBXYPHOID PERICARDIAL WINDOW;  Surgeon: Grace Isaac, MD;  Location: [place];  Service: Thoracic;  Laterality: N/A;  . TONSILLECTOMY    . total knee replacment     bilat  . trigger finger repair     also lue, cts surgically repaired  . WISDOM TOOTH EXTRACTION      FAMHx:  Family History  Problem Relation Age of Onset  . Depression Mother   . Hypertension Mother   . Dementia Mother   . Heart disease Father        MI  . Depression Brother   . Stroke Paternal Aunt        CVA  . Diabetes Paternal Uncle   . Heart disease Paternal Uncle        MI  . Heart disease Paternal Grandmother        MI    . Diabetes Cousin        PATERNAL    SOCHx:   reports that he quit smoking about 43 years ago. His smoking use included cigarettes. He started smoking about 56 years ago. He has a 2.60 pack-year  smoking history. He has never used smokeless tobacco. He reports that he does not drink alcohol or use drugs.  ALLERGIES:  Allergies  Allergen Reactions  . Morphine Other (See Comments)    Nausea and Severe hallucinations  . Codeine Nausea Only  . Penicillins Other (See Comments)    LOC with shot  . Propoxyphene N-Acetaminophen Nausea Only    ROS: Pertinent items noted in HPI and remainder of comprehensive ROS otherwise negative.  HOME MEDS: Current Outpatient Medications  Medication Sig Dispense Refill  . atorvastatin (LIPITOR) 80 MG tablet TAKE 1 TABLET BY MOUTH EVERY DAY 90 tablet 1  . CALCIUM PO Take 500 mg by mouth 2 (two) times daily.     . Cyanocobalamin (VITAMIN B-12) 1000 MCG SUBL Place 5,000 mcg under the tongue every evening.     [name] Sodium (DSS) 100 MG CAPS Take 100 mg by mouth every other day.    [name] ELIQUIS 5 MG TABS tablet TAKE 1 TABLET BY MOUTH TWICE A DAY 180 tablet 1  . finasteride (PROSCAR) 5 MG tablet Take 1 tablet (5 mg total) by mouth daily. 30 tablet 1  . flecainide (TAMBOCOR) 50 MG tablet TAKE 1 TABLET BY MOUTH EVERY 12 HOURS 180 tablet 0  . loratadine (CLARITIN) 10 MG tablet Take 10 mg by mouth daily.    [name] LORazepam (ATIVAN) 0.5 MG tablet Take 1 tablet (0.5 mg total) by mouth at bedtime. 30 tablet 2  . montelukast (SINGULAIR) 10 MG tablet TAKE 1 TABLET BY MOUTH EVERYDAY AT BEDTIME 90 tablet 1  . pantoprazole (PROTONIX) 40 MG tablet Take 1 tablet (40 mg total) by mouth daily. 90 tablet 1  . potassium chloride SA (KLOR-CON M20) 20 MEQ tablet Take 2 tablets (40 mEq total) by mouth daily. 180 tablet 3  . rOPINIRole (REQUIP) 0.5 MG tablet TAKE 1 TABLET BY MOUTH EVERYDAY AT BEDTIME 90 tablet 1  . sertraline (ZOLOFT) 100 MG tablet Take 1.5 tablets (150 mg total)  by mouth daily. 45 tablet 2  . torsemide (DEMADEX) 20 MG tablet TAKE 3 TABLETS (60 MG TOTAL) BY MOUTH DAILY. KEEP APPOINTMENT AS SCHEDULED. 90 tablet 3  . traMADol (ULTRAM) 50 MG tablet Take 1 tablet (50 mg total) by mouth 2 (two) times daily. (Patient taking differently: Take 50 mg by mouth as needed. ) 30 tablet 0   No current facility-administered medications for this visit.     LABS/IMAGING: No results found for this or any previous visit (from the past 48 hour(s)). No results found.  VITALS: BP 123/79   Pulse 73   Ht 6' (1.829 m)   Wt (!) 377 lb 3.2 oz (171.1 kg)   BMI 51.16 kg/m   EXAM: General appearance: alert, no distress and morbidly obese Neck: no carotid bruit, no JVD and thyroid not enlarged, symmetric, no tenderness/mass/nodules Lungs: diminished breath sounds bilaterally Heart: regular rate and rhythm Abdomen: morbidly obese Extremities: extremities normal, atraumatic, no cyanosis or edema Pulses: 2+ and symmetric Skin: pale, warm, diaphoretic Neurologic: Grossly normal Psych: Pleasant  EKG: Normal sinus rhythm 73, nonspecific ST changes-personally reviewed  ASSESSMENT: 1. Persistent dyspnea on exertion 2. Recent stroke with partial vocal cord paralysis 3. Paroxysmal atrial fibrillation on flecainide 4. Hyperlipidemia 5. Hypertension 6. Recent history of pericarditis/pericardial effusion status post pericardial window 7. Pulmonary embolus 8. Morbid obesity 9. Obstructive sleep apnea on CPAP  PLAN: 1.   [name] continues to be short of breath.  He says he wants to start to get more  active.  He continues to use CPAP.  He is contemplating possible vocal cord surgery.  I appreciate the assistance from pulmonary however it appears that there are no active pulmonary issues.  We will recheck a metabolic profile and probably continue him on torsemide.  Follow-up with me annually or sooner as necessary.  Pixie Casino, MD, [facility], [place] Director of the Advanced Lipid Disorders &  Cardiovascular Risk Reduction Clinic Attending Cardiologist  Direct Dial: [phone]  Fax: [phone]  Website:  [email or web address]  [name] [name] 01/09/2018, 2:12 PM

## 2018-01-09 NOTE — Patient Instructions (Signed)
Medication Instructions:  Continue current medications  If you need a refill on your cardiac medications before your next appointment, please call your pharmacy.   Follow-Up: At [facility], you and your health needs are our priority.  As part of our continuing mission to provide you with exceptional heart care, we have created designated Provider Care Teams.  These Care Teams include your primary Cardiologist (physician) and Advanced Practice Providers (APPs -  Physician Assistants and Nurse Practitioners) who all work together to provide you with the care you need, when you need it. You will need a follow up appointment in 1 year.  Please call our office 2 months in advance to schedule this appointment.  You may see Dr. Debara Pickett or one of the following Advanced Practice Providers on your designated Care Team: Almyra Deforest, [place] . Fabian Sharp, PA-C  Any Other Special Instructions Will Be Listed Below (If Applicable).

## 2018-01-13 ENCOUNTER — Other Ambulatory Visit: Payer: Self-pay | Admitting: Internal Medicine

## 2018-01-15 DIAGNOSIS — Q6102 Congenital multiple renal cysts: Secondary | ICD-10-CM | POA: Diagnosis not present

## 2018-01-15 DIAGNOSIS — N5201 Erectile dysfunction due to arterial insufficiency: Secondary | ICD-10-CM | POA: Diagnosis not present

## 2018-01-15 DIAGNOSIS — R3911 Hesitancy of micturition: Secondary | ICD-10-CM | POA: Diagnosis not present

## 2018-01-15 DIAGNOSIS — N200 Calculus of kidney: Secondary | ICD-10-CM | POA: Diagnosis not present

## 2018-01-24 ENCOUNTER — Ambulatory Visit (HOSPITAL_BASED_OUTPATIENT_CLINIC_OR_DEPARTMENT_OTHER): Payer: Medicare Other | Admitting: Physical Medicine & Rehabilitation

## 2018-01-24 ENCOUNTER — Encounter: Payer: Self-pay | Admitting: Physical Medicine & Rehabilitation

## 2018-01-24 VITALS — BP 116/75 | HR 78 | Resp 14 | Ht 72.0 in | Wt 377.0 lb

## 2018-01-24 DIAGNOSIS — G463 Brain stem stroke syndrome: Secondary | ICD-10-CM

## 2018-01-24 DIAGNOSIS — I69398 Other sequelae of cerebral infarction: Secondary | ICD-10-CM

## 2018-01-24 DIAGNOSIS — M47817 Spondylosis without myelopathy or radiculopathy, lumbosacral region: Secondary | ICD-10-CM

## 2018-01-24 DIAGNOSIS — M533 Sacrococcygeal disorders, not elsewhere classified: Secondary | ICD-10-CM | POA: Diagnosis not present

## 2018-01-24 DIAGNOSIS — M47816 Spondylosis without myelopathy or radiculopathy, lumbar region: Secondary | ICD-10-CM | POA: Diagnosis not present

## 2018-01-24 DIAGNOSIS — R269 Unspecified abnormalities of gait and mobility: Secondary | ICD-10-CM | POA: Diagnosis not present

## 2018-01-24 NOTE — Progress Notes (Signed)
Subjective:    Patient ID: [name], male    DOB: 02/18/1946, 72 y.o.   MRN: [id number]  HPI Bilateral Lumbar  L4  medial branch blocks and L 5 dorsal ramus injection under fluoroscopic guidance  Pain levels much improved since injection on 01/03/18 No tramadol needed since injection  Discussion about Radiofrequency procedure  No postprocedure complications line patient still complains of poor endurance as well as poor balance, currently using a cane but for longer distances feels more secure with rolling walker.  Because of his poor endurance he requires a walker that he can sit up on  Pain Inventory Average Pain 2 Pain Right Now 2 My pain is aching  In the last 24 hours, has pain interfered with the following? General activity 3 Relation with others 2 Enjoyment of life 2 What TIME of day is your pain at its worst? daytime Sleep (in general) Fair  Pain is worse with: walking, bending and standing Pain improves with: rest and medication Relief from Meds: 5  Mobility walk with assistance use a cane use a walker how many minutes can you walk? 5 ability to climb steps?  yes do you drive?  yes Do you have any goals in this area?  no  Function retired  Neuro/Psych weakness numbness trouble walking dizziness  Prior Studies Any changes since last visit?  no  Physicians involved in your care Any changes since last visit?  no   Family History  Problem Relation Age of Onset  . Depression Mother   . Hypertension Mother   . Dementia Mother   . Heart disease Father        MI  . Depression Brother   . Stroke Paternal Aunt        CVA  . Diabetes Paternal Uncle   . Heart disease Paternal Uncle        MI  . Heart disease Paternal Grandmother        MI  . Diabetes Cousin        PATERNAL   Social History   Socioeconomic History  . Marital status: Married    Spouse name: Not on file  . Number of children: 1  . Years of education: Not on file  .  Highest education level: Not on file  Occupational History  . Occupation: Merchant navy officer  Social Needs  . Financial resource strain: Not on file  . Food insecurity:    Worry: Not on file    Inability: Not on file  . Transportation needs:    Medical: Not on file    Non-medical: Not on file  Tobacco Use  . Smoking status: Former Smoker    Packs/day: 0.20    Years: 13.00    Pack years: 2.60    Types: Cigarettes    Start date: 04/03/1961    Last attempt to quit: 08/30/1974    Years since quitting: 43.4  . Smokeless tobacco: Never Used  Substance and Sexual Activity  . Alcohol use: No    Alcohol/week: 0.0 standard drinks  . Drug use: No  . Sexual activity: Not Currently  Lifestyle  . Physical activity:    Days per week: Not on file    Minutes per session: Not on file  . Stress: Not on file  Relationships  . Social connections:    Talks on phone: Not on file    Gets together: Not on file    Attends religious service: Not on file  Active member of club or organization: Not on file    Attends meetings of clubs or organizations: Not on file    Relationship status: Not on file  Other Topics Concern  . Not on file  Social History Narrative   Lives at home w/ his wife   Right-handed   Caffeine: decaf coffee, occasional tea   Past Surgical History:  Procedure Laterality Date  . ARTHROSCOPY KNEE W/ DRILLING    . BREATH TEK H PYLORI N/A 12/23/2012   Procedure: BREATH TEK H PYLORI;  Surgeon: Gayland Curry, MD;  Location: [facility];  Service: General;  Laterality: N/A;  . CHOLECYSTECTOMY  1989  . COLONOSCOPY N/A 07/31/2013   Procedure: COLONOSCOPY;  Surgeon: Gatha Mayer, MD;  Location: [place];  Service: Endoscopy;  Laterality: N/A;  . ESOPHAGOGASTRODUODENOSCOPY N/A 07/22/2013   Procedure: ESOPHAGOGASTRODUODENOSCOPY (EGD);  Surgeon: Irene Shipper, MD;  Location: [facility] ENDOSCOPY;  Service: Endoscopy;  Laterality: N/A;  . ESOPHAGOGASTRODUODENOSCOPY N/A 07/31/2013    Procedure: ESOPHAGOGASTRODUODENOSCOPY (EGD);  Surgeon: Gatha Mayer, MD;  Location: [facility] ENDOSCOPY;  Service: Endoscopy;  Laterality: N/A;  . INTRAOPERATIVE TRANSESOPHAGEAL ECHOCARDIOGRAM N/A 07/18/2013   Procedure: INTRAOPERATIVE TRANSESOPHAGEAL ECHOCARDIOGRAM;  Surgeon: Grace Isaac, MD;  Location: [place];  Service: Open Heart Surgery;  Laterality: N/A;  . LAPAROSCOPIC GASTRIC BANDING WITH HIATAL HERNIA REPAIR N/A 04/08/2013   Procedure: LAPAROSCOPIC GASTRIC BANDING WITH possible  HIATAL HERNIA REPAIR;  Surgeon: Gayland Curry, MD;  [facility];  Service: General;  Laterality: N/A;  . LITHOTRIPSY    . renal calculi    . SUBXYPHOID PERICARDIAL WINDOW N/A 07/18/2013   Procedure: SUBXYPHOID PERICARDIAL WINDOW;  Surgeon: Grace Isaac, MD;  Location: [place];  Service: Thoracic;  Laterality: N/A;  . TONSILLECTOMY    . total knee replacment     bilat  . trigger finger repair     also lue, cts surgically repaired  . WISDOM TOOTH EXTRACTION     Past Medical History:  Diagnosis Date  . Anemia 07/29/2013  . Asthma    childhood asthma  . Benign essential HTN   . Benign prostatic hypertrophy    several prostate biopsies neg for cancer.   . Bilateral hip pain   . Cancer ([place])   . CHF (congestive heart failure) ([place])   . Chronic anticoagulation 07/22/2013  . Complication of anesthesia    MORPHINE CAUSES HALLUCINATIONS  . CVA (cerebral vascular accident) ([place]) 06/2016  . Depression   . Diastolic congestive heart failure ([place]) 07/16/2013  . Dysphagia, post-stroke   . Embolic cerebral infarction ([place]) 06/21/2016  . FH: colonic polyps 2010  . Gout   . History of kidney stones   . Hypertension   . Kidney cysts   . Morbid obesity ([place])   . Numbness    feet / legs  . PAF (paroxysmal atrial fibrillation) with RVR 07/29/2013  . Recurrent pulmonary emboli ([place]) 06/13/2015  . Sleep apnea    USES C PAP  . Stroke syndrome   . 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   BP 116/75   Pulse 78   Resp 14    Ht 6' (1.829 m)   Wt (!) 377 lb (171 kg)   SpO2 95%   BMI 51.13 kg/m   Opioid Risk Score:   Fall Risk Score:  `1  Depression screen PHQ 2/9  Depression screen [facility] 2/9 10/01/2017 07/28/2016 02/16/2015  Decreased Interest 0 0 0  Down, Depressed, Hopeless 0 0 0  PHQ - 2  Score 0 0 0  Altered sleeping - 0 -  Tired, decreased energy - 1 -  Change in appetite - 0 -  Feeling bad or failure about yourself  - 0 -  Trouble concentrating - 1 -  Moving slowly or fidgety/restless - 0 -  PHQ-9 Score - 2 -  Difficult doing work/chores - Not difficult at all -  Some recent data might be hidden    Review of Systems  Constitutional: Positive for unexpected weight change.  HENT: Negative.   Eyes: Negative.   Respiratory: Positive for apnea, shortness of breath and wheezing.   Cardiovascular: Positive for leg swelling.  Gastrointestinal: Negative.   Endocrine: Negative.   Genitourinary: Negative.   Musculoskeletal: Positive for gait problem.  Skin: Negative.   Allergic/Immunologic: Negative.   Neurological: Positive for dizziness, weakness and numbness.  Hematological: Negative.   Psychiatric/Behavioral: Negative.        Objective:   Physical Exam  Constitutional: He is oriented to person, place, and time. He appears well-developed.  Morbid obesity  HENT:  Head: Normocephalic and atraumatic.  Eyes: Pupils are equal, round, and reactive to light. EOM are normal.  Neurological: He is alert and oriented to person, place, and time.  Negative straight leg raise bilaterally Ambulates with a cane when he lets go over the cane he falls toward the left side.  Skin: Skin is warm and dry.  Vitals reviewed.  Lumbar spine no tenderness palpation in the paraspinal area he has limited range of motion with flexion extension lateral bending and rotation       Assessment & Plan:  1.  Lumbar spondylosis without myelopathy he has had excellent results with L4-5 medial branch blocks for scopic  guidance performed bilaterally. We discussed that it is difficult to predict the duration of response.  If it is less than 2 months I would recommend radiofrequency as the next step starting with the right side been going to the left side. If duration of response is greater than 2 months I would recommend repeat medial branch blocks no sooner than January 2020 2.  Gait imbalance post stroke lateral portion consistent with prior left lateral medullary infarct.,  We discussed the importance of exercise to build up his strength but also his weight is impacting his overall mobility.  I do think he would benefit from extra-large [place] walker to help with longer distances.

## 2018-01-25 ENCOUNTER — Other Ambulatory Visit: Payer: Self-pay | Admitting: Internal Medicine

## 2018-01-25 DIAGNOSIS — E785 Hyperlipidemia, unspecified: Secondary | ICD-10-CM

## 2018-02-04 ENCOUNTER — Other Ambulatory Visit (HOSPITAL_COMMUNITY): Payer: Self-pay | Admitting: Psychiatry

## 2018-02-04 DIAGNOSIS — F330 Major depressive disorder, recurrent, mild: Secondary | ICD-10-CM

## 2018-02-05 ENCOUNTER — Other Ambulatory Visit (HOSPITAL_COMMUNITY): Payer: Self-pay

## 2018-02-05 DIAGNOSIS — F330 Major depressive disorder, recurrent, mild: Secondary | ICD-10-CM

## 2018-02-05 MED ORDER — LORAZEPAM 0.5 MG PO TABS: 0.5000 mg | ORAL_TABLET | Freq: Every day | ORAL | 0 refills | Status: DC

## 2018-02-13 ENCOUNTER — Ambulatory Visit (INDEPENDENT_AMBULATORY_CARE_PROVIDER_SITE_OTHER): Payer: Medicare Other | Admitting: Psychiatry

## 2018-02-13 ENCOUNTER — Encounter (HOSPITAL_COMMUNITY): Payer: Self-pay | Admitting: Psychiatry

## 2018-02-13 VITALS — BP 143/75 | HR 74 | Ht 72.0 in | Wt 380.0 lb

## 2018-02-13 DIAGNOSIS — F330 Major depressive disorder, recurrent, mild: Secondary | ICD-10-CM | POA: Diagnosis not present

## 2018-02-13 DIAGNOSIS — G463 Brain stem stroke syndrome: Secondary | ICD-10-CM | POA: Diagnosis not present

## 2018-02-13 DIAGNOSIS — F411 Generalized anxiety disorder: Secondary | ICD-10-CM

## 2018-02-13 MED ORDER — LORAZEPAM 0.5 MG PO TABS: 0.5000 mg | ORAL_TABLET | Freq: Every day | ORAL | 2 refills | Status: DC

## 2018-02-13 MED ORDER — SERTRALINE HCL 100 MG PO TABS: 150.0000 mg | ORAL_TABLET | Freq: Every day | ORAL | 2 refills | Status: DC

## 2018-02-13 NOTE — Progress Notes (Signed)
BH MD/PA/NP OP Progress Note  02/13/2018 2:40 PM [name]  MRN:  [id number]  Chief Complaint: I am doing okay.  I think my medicine working.  I do not feel depressed anymore.  HPI: [name] came for his follow-up appointment.  He is taking his medication as prescribed.  In 03-Dec-2022 her mother died and he was very sad but he is relieved that she is in best place.  Her mother has late stage dementia.  Patient concerned about her physical health as lately he is been falling and get easily tired.  He cannot stand longer than 5-minute because he gets dizzy.  But he is happy that he is able to drive.  He feels his depression is under control and he denies any recent feeling of hopelessness or worthlessness.  He has no tremors, shakes or any EPS.  He is sleeping better with CPAP machine.  He denies any paranoia, hallucination, suicidal thoughts or homicidal thought.  He has chronic somatic complaint including feeling tired, generalized weakness, tingling neuropathy and back pain.  He is not drinking or using any illegal substances.  He is happy that his 72-year-old granddaughter going to spend time on holidays.  Visit Diagnosis:    ICD-10-CM   1. Generalized anxiety disorder F41.1   2. Major depressive disorder, recurrent episode, mild (HCC) F33.0 sertraline (ZOLOFT) 100 MG tablet    LORazepam (ATIVAN) 0.5 MG tablet    Past Psychiatric History: Reviewed. Patient has one psychiatric admission in 2006 due to increased depression and having suicidal thoughts. He was taking Risperdal but it was discontinued after the stroke in March 2018.  Past Medical History:  Past Medical History:  Diagnosis Date  . Anemia 07/29/2013  . Asthma    childhood asthma  . Benign essential HTN   . Benign prostatic hypertrophy    several prostate biopsies neg for cancer.   . Bilateral hip pain   . Cancer ([place])   . CHF (congestive heart failure) ([place])   . Chronic anticoagulation 07/22/2013  . Complication of  anesthesia    MORPHINE CAUSES HALLUCINATIONS  . CVA (cerebral vascular accident) ([place]) 06/2016  . Depression   . Diastolic congestive heart failure ([place]) 07/16/2013  . Dysphagia, post-stroke   . Embolic cerebral infarction ([place]) 06/21/2016  . FH: colonic polyps 2010  . Gout   . History of kidney stones   . Hypertension   . Kidney cysts   . Morbid obesity ([place])   . Numbness    feet / legs  . PAF (paroxysmal atrial fibrillation) with RVR 07/29/2013  . Recurrent pulmonary emboli ([place]) 06/13/2015  . Sleep apnea    USES C PAP  . Stroke syndrome   . 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 ARTHROSCOPY KNEE W/ DRILLING    . BREATH TEK H PYLORI N/A 12/23/2012   Procedure: BREATH TEK H PYLORI;  Surgeon: Gayland Curry, MD;  Location: [facility];  Service: General;  Laterality: N/A;  . CHOLECYSTECTOMY  1989  . COLONOSCOPY N/A 07/31/2013   Procedure: COLONOSCOPY;  Surgeon: Gatha Mayer, MD;  Location: [place];  Service: Endoscopy;  Laterality: N/A;  . ESOPHAGOGASTRODUODENOSCOPY N/A 07/22/2013   Procedure: ESOPHAGOGASTRODUODENOSCOPY (EGD);  Surgeon: Irene Shipper, MD;  Location: [facility] ENDOSCOPY;  Service: Endoscopy;  Laterality: N/A;  . ESOPHAGOGASTRODUODENOSCOPY N/A 07/31/2013   Procedure: ESOPHAGOGASTRODUODENOSCOPY (EGD);  Surgeon: Gatha Mayer, MD;  Location: [facility] ENDOSCOPY;  Service: Endoscopy;  Laterality: N/A;  .  INTRAOPERATIVE TRANSESOPHAGEAL ECHOCARDIOGRAM N/A 07/18/2013   Procedure: INTRAOPERATIVE TRANSESOPHAGEAL ECHOCARDIOGRAM;  Surgeon: Grace Isaac, MD;  Location: [place];  Service: Open Heart Surgery;  Laterality: N/A;  . LAPAROSCOPIC GASTRIC BANDING WITH HIATAL HERNIA REPAIR N/A 04/08/2013   Procedure: LAPAROSCOPIC GASTRIC BANDING WITH possible  HIATAL HERNIA REPAIR;  Surgeon: Gayland Curry, MD;  Location: [facility];  Service: General;  Laterality: N/A;  . LITHOTRIPSY    . renal calculi    . SUBXYPHOID PERICARDIAL WINDOW N/A 07/18/2013    Procedure: SUBXYPHOID PERICARDIAL WINDOW;  Surgeon: Grace Isaac, MD;  Location: [place];  Service: Thoracic;  Laterality: N/A;  . TONSILLECTOMY    . total knee replacment     bilat  . trigger finger repair     also lue, cts surgically repaired  . WISDOM TOOTH EXTRACTION      Family Psychiatric History: Reviewed.  Family History:  Family History  Problem Relation Age of Onset  . Depression Mother   . Hypertension Mother   . Dementia Mother   . Heart disease Father        MI  . Depression Brother   . Stroke Paternal Aunt        CVA  . Diabetes Paternal Uncle   . Heart disease Paternal Uncle        MI  . Heart disease Paternal Grandmother        MI  . Diabetes Cousin        PATERNAL    Social History:  Social History   Socioeconomic History  . Marital status: Married    Spouse name: Not on file  . Number of children: 1  . Years of education: Not on file  . Highest education level: Not on file  Occupational History  . Occupation: Merchant navy officer  Social Needs  . Financial resource strain: Not on file  . Food insecurity:    Worry: Not on file    Inability: Not on file  . Transportation needs:    Medical: Not on file    Non-medical: Not on file  Tobacco Use  . Smoking status: Former Smoker    Packs/day: 0.20    Years: 13.00    Pack years: 2.60    Types: Cigarettes    Start date: 04/03/1961    Last attempt to quit: 08/30/1974    Years since quitting: 43.4  . Smokeless tobacco: Never Used  Substance and Sexual Activity  . Alcohol use: No    Alcohol/week: 0.0 standard drinks  . Drug use: No  . Sexual activity: Not Currently  Lifestyle  . Physical activity:    Days per week: Not on file    Minutes per session: Not on file  . Stress: Not on file  Relationships  . Social connections:    Talks on phone: Not on file    Gets together: Not on file    Attends religious service: Not on file    Active member of club or organization: Not on file     Attends meetings of clubs or organizations: Not on file    Relationship status: Not on file  Other Topics Concern  . Not on file  Social History Narrative   Lives at home w/ his wife   Right-handed   Caffeine: decaf coffee, occasional tea    Allergies:  Allergies  Allergen Reactions  . Morphine Other (See Comments)    Nausea and Severe hallucinations  . Codeine Nausea Only  . Penicillins Other (  See Comments)    LOC with shot  . Propoxyphene N-Acetaminophen Nausea Only    Metabolic Disorder Labs: Lab Results  Component Value Date   HGBA1C 6.3 11/13/2017   MPG 120 06/19/2016   MPG 128 (H) 12/05/2012   No results found for: PROLACTIN Lab Results  Component Value Date   CHOL 151 11/13/2017   TRIG 283.0 (H) 11/13/2017   HDL 30.20 (L) 11/13/2017   CHOLHDL 5 11/13/2017   VLDL 56.6 (H) 11/13/2017   LDLCALC 77 06/19/2016   LDLCALC 81 04/26/2016   Lab Results  Component Value Date   TSH 4.50 11/13/2017   TSH 2.10 10/24/2016    Therapeutic Level Labs: No results found for: LITHIUM No results found for: VALPROATE No components found for:  CBMZ  Current Medications: Current Outpatient Medications  Medication Sig Dispense Refill  . atorvastatin (LIPITOR) 80 MG tablet TAKE 1 TABLET BY MOUTH EVERY DAY 90 tablet 1  . CALCIUM PO Take 500 mg by mouth 2 (two) times daily.     . Cyanocobalamin (VITAMIN B-12) 1000 MCG SUBL Place 5,000 mcg under the tongue every evening.     [name] Sodium (DSS) 100 MG CAPS Take 100 mg by mouth every other day.    [name] ELIQUIS 5 MG TABS tablet TAKE 1 TABLET BY MOUTH TWICE A DAY 180 tablet 1  . finasteride (PROSCAR) 5 MG tablet Take 1 tablet (5 mg total) by mouth daily. 30 tablet 1  . flecainide (TAMBOCOR) 50 MG tablet TAKE 1 TABLET BY MOUTH EVERY 12 HOURS 180 tablet 0  . furosemide (LASIX) 40 MG tablet Take 40 mg by mouth.    . loratadine (CLARITIN) 10 MG tablet Take 10 mg by mouth daily.    [name] LORazepam (ATIVAN) 0.5 MG tablet Take 1 tablet (0.5  mg total) by mouth at bedtime. 30 tablet 0  . montelukast (SINGULAIR) 10 MG tablet TAKE 1 TABLET BY MOUTH EVERYDAY AT BEDTIME 90 tablet 1  . pantoprazole (PROTONIX) 40 MG tablet Take 1 tablet (40 mg total) by mouth daily. 90 tablet 1  . potassium chloride SA (KLOR-CON M20) 20 MEQ tablet Take 2 tablets (40 mEq total) by mouth daily. 180 tablet 3  . rOPINIRole (REQUIP) 0.5 MG tablet TAKE 1 TABLET BY MOUTH EVERYDAY AT BEDTIME 90 tablet 1  . sertraline (ZOLOFT) 100 MG tablet Take 1.5 tablets (150 mg total) by mouth daily. 45 tablet 2  . torsemide (DEMADEX) 20 MG tablet TAKE 3 TABLETS (60 MG TOTAL) BY MOUTH DAILY. KEEP APPOINTMENT AS SCHEDULED. 90 tablet 3  . traMADol (ULTRAM) 50 MG tablet Take 1 tablet (50 mg total) by mouth 2 (two) times daily. (Patient taking differently: Take 50 mg by mouth as needed. ) 30 tablet 0   No current facility-administered medications for this visit.      Musculoskeletal: Strength & Muscle Tone: decreased Gait & Station: walk with cane Patient leans: Right  Psychiatric Specialty Exam: ROS  Blood pressure (!) 143/75, pulse 74, height 6' (1.829 m), weight (!) 380 lb (172.4 kg), SpO2 96 %.Body mass index is 51.54 kg/m.  General Appearance: Casual  Eye Contact:  Good  Speech:  Clear and Coherent  Volume:  Normal  Mood:  Anxious  Affect:  Congruent  Thought Process:  Goal Directed  Orientation:  Full (Time, Place, and Person)  Thought Content: Logical   Suicidal Thoughts:  No  Homicidal Thoughts:  No  Memory:  Immediate;   Fair Recent;   Fair Remote;  Fair  Judgement:  Good  Insight:  Good  Psychomotor Activity:  Decreased  Concentration:  Concentration: Fair and Attention Span: Fair  Recall:  Good  Fund of Knowledge: Good  Language: Good  Akathisia:  No  Handed:  Right  AIMS (if indicated): not done  Assets:  Communication Skills Desire for Improvement Housing Resilience Social Support  ADL's:  Intact  Cognition: WNL  Sleep:  Fair    Screenings: PHQ2-9     [facility] 07/28/2016 in Dr[facility] Alysia [facility]- [place] [facility] Office Visit from 03/30/2014 in [place] from 03/07/2013 in [place] at  [employer] Total Score  0  0  0  0  0  PHQ-9 Total Score  -  2  -  -  -       Assessment and Plan: Major depressive disorder, recurrent.  Generalized anxiety disorder.  Patient doing better on his current medication.  Continue Zoloft 150 mg daily and lorazepam 0.5 mg at bedtime for anxiety.  Recommended to call us back if is any question or any concern.  Discussed medication side effects and benefits.  He is not interested in counseling and therapy at this time.  Follow-up in 3 months.   Kathlee Nations, MD 02/13/2018, 2:40 PM

## 2018-03-05 ENCOUNTER — Other Ambulatory Visit (HOSPITAL_COMMUNITY): Payer: Self-pay | Admitting: Psychiatry

## 2018-03-05 DIAGNOSIS — F330 Major depressive disorder, recurrent, mild: Secondary | ICD-10-CM

## 2018-03-06 DIAGNOSIS — Z23 Encounter for immunization: Secondary | ICD-10-CM | POA: Diagnosis not present

## 2018-03-06 DIAGNOSIS — L03011 Cellulitis of right finger: Secondary | ICD-10-CM | POA: Diagnosis not present

## 2018-03-08 ENCOUNTER — Telehealth: Payer: Self-pay | Admitting: Physical Medicine & Rehabilitation

## 2018-03-08 NOTE — Telephone Encounter (Signed)
Spoke to [employer] and hospital billing -hospital billing recoded and appealed 1202.19-- per anthony at [employer] they did recode and rebill - however denied again.  Asked him to confirm cpt diagnosis - they used gait disturbance.  I sent Lumb Spondylosis and sac.ljoint dysfunction as alternates and ask to rebill

## 2018-03-13 ENCOUNTER — Other Ambulatory Visit: Payer: Self-pay | Admitting: Internal Medicine

## 2018-03-25 ENCOUNTER — Other Ambulatory Visit: Payer: Self-pay | Admitting: Internal Medicine

## 2018-04-06 ENCOUNTER — Other Ambulatory Visit: Payer: Self-pay | Admitting: Internal Medicine

## 2018-04-08 ENCOUNTER — Telehealth: Payer: Self-pay | Admitting: Internal Medicine

## 2018-04-08 ENCOUNTER — Encounter: Payer: Self-pay | Admitting: Internal Medicine

## 2018-04-08 ENCOUNTER — Ambulatory Visit (INDEPENDENT_AMBULATORY_CARE_PROVIDER_SITE_OTHER): Payer: Medicare Other | Admitting: Internal Medicine

## 2018-04-08 VITALS — BP 128/76 | HR 80 | Ht 72.0 in | Wt 380.4 lb

## 2018-04-08 DIAGNOSIS — G4733 Obstructive sleep apnea (adult) (pediatric): Secondary | ICD-10-CM

## 2018-04-08 DIAGNOSIS — J383 Other diseases of vocal cords: Secondary | ICD-10-CM

## 2018-04-08 DIAGNOSIS — G4761 Periodic limb movement disorder: Secondary | ICD-10-CM

## 2018-04-08 DIAGNOSIS — R0602 Shortness of breath: Secondary | ICD-10-CM

## 2018-04-08 MED ORDER — ROPINIROLE HCL 0.5 MG PO TABS: ORAL_TABLET | ORAL | 3 refills | Status: DC

## 2018-04-08 NOTE — Progress Notes (Signed)
HPI male former smoker followed for OSA, Restless Legs,, complicated by A. fib, recurrent PE, bariatric surgery, major depression, dysphonia, midbrain CVA NPSG 10/25/05- severe OSA, AHI 79/ hr, CPAP to 16. Frequent limb jerks with sleep disturbance PFT 01/03/17-minimal obstruction without response to bronchodilator.  FVC 3.94/83%, FEV1 3.00/86%, ratio 2.76, TLC 96%, DLCO 78%  ----------------------------------------------------------------------------------- 04/06/17- 73 year old male former smoker followed for OSA, Restless Legs,, complicated by A. fib, recurrent PE, bariatric surgery, major depression, dysphonia, midbrain CVA CPAP auto 10-20/Advanced PFT 01/03/17-minimal obstruction without response to bronchodilator.  FVC 3.94/83%, FEV1 3.00/86%, ratio 2.76, TLC 96%, DLCO 78% CPAP desensitization mask fit 02/20/17 Echo 06/19/16-EF 60-65% ----having some weakness,body aches,headache, cough with brown,green,thick sputum,sore throat,started 03/10/17, Mucinex made him feel worse.   He has had a bad cold for a week and a half-mostly significant nasal congestion and drainage, feels stopped up.  Relatively little cough or wheeze.  No fever. Always feels unsteady since mid brain stroke which affected vision and balance.  Residual left vocal cord paralysis treated at [facility].  He has to be careful not to aspirate. CPAP download-97% compliance, AHI 9.0/hour.  At fixed pressure at 18.  04/08/18- 73 year old male former smoker followed for OSA, Restless Legs, complicated by A. Fib, diastolic CHF recurrent PE, bariatric surgery, major depression, dysphonia, midbrain CVA CPAP set 20/Advanced -----OSA: DME [facility] Pt wears CPAP nighlty and DL attached. Pt notes Increased SOB with any exertion.  Download 87% compliance AHI 5.6/hour He has been more short of breath than usual and had to stop while walking from lobby to exam room. Body weight today 380 pounds-  In this range over past year Walk test on room air 04/08/2018-   Max HR 91, Min Sat 92%. Complains he stays tired, not relieved by naps.  May not wear CPAP with naps.  Admits bedtime as late as 2 AM, gets up late in the morning.  Very sensitive to caffeine so avoids it.  Unsteady walking even with a walker so inactive.  Very deconditioned.  Any activity makes him short of breath. Requip used to help leg jerks taking 2 or 3 tablets at night.  1 tablet is not sufficient. Vocal cord paralysis following CVA, being treated at voice clinic at [facility].  Strangles easily while eating or drinking.  ROS-see HPI   + = positive Constitutional:    weight loss, night sweats, fevers, chills, fatigue, lassitude. HEENT:    headaches, difficulty swallowing, tooth/dental problems, sore throat,       sneezing, itching, ear ache,+ nasal congestion, post nasal drip, snoring CV:    chest pain, orthopnea, PND, swelling in lower extremities, anasarca,                                                dizziness, +palpitations Resp:   +shortness of breath with exertion or at rest.                productive cough,   non-productive cough, coughing up of blood.              change in color of mucus.  wheezing.   Skin:    rash or lesions. GI:  No-   heartburn, indigestion, abdominal pain, nausea, vomiting,  GU:  MS:   joint pain, stiffness, Neuro-     nothing unusual Psych:  change in mood or affect.  depression  or anxiety.   memory loss.  OBJ- Physical Exam General- Alert, Oriented, Affect-appropriate, Distress- none acute, + morbidly obese, cane Skin- rash-none, lesions- none, excoriation- none Lymphadenopathy- none Head- atraumatic            Eyes- Gross vision intact, PERRLA, conjunctivae and secretions clear            Ears- +Hearing aid            Nose-  +moderate turbinate edema and mucus congestion, no-Septal dev, mucus, polyps,  erosion, perforation             Throat- Mallampati III , mucosa clear , drainage- none, tonsils- atrophic, + hoarse Neck- flexible , trachea midline,  no stridor , thyroid nl, carotid no bruit Chest - symmetrical excursion , unlabored           Heart/CV- RRR , no murmur , no gallop  , no rub, nl s1 s2                           - JVD- none , edema- none, stasis changes- none, varices- none           Lung- clear to P&A, wheeze- none, cough- none , dullness-none, rub- none           Chest wall-  Abd-  Br/ Gen/ Rectal- Not done, not indicated Extrem- cyanosis- none, clubbing, none, atrophy- none, strength- nl Neuro- grossly intact to observation

## 2018-04-08 NOTE — Telephone Encounter (Signed)
Patient states he needs a letter of medical necessity to lower cost of eliquis sent to [facility]. He will send MyChart message with fax #  Member ID: [id number] Plan # [facility]

## 2018-04-08 NOTE — Patient Instructions (Signed)
Script sent for Requip- try 2 tabs at bedtime for limb jerks at night  Regular walking every day with your walker would help with weight and stamina  We can continue CPAP 20, mask of choice, humidifier, supplies, AirView  Please cal if we can help

## 2018-04-08 NOTE — Telephone Encounter (Signed)
New message       Pt stated that dr. Debara Pickett PA or nurse helped him fill out form to reduce cost of Eliquis about an year ago. Pt stated that he has an new Buyer, retail under Company secretary and uses [facility] [address] for pharmacy. Pt stated that form does not transfer over to new plan.

## 2018-04-09 NOTE — Assessment & Plan Note (Signed)
He benefits from CPAP with improved sleep.  Download confirms adequate compliance and control. Plan-continue CPAP 20

## 2018-04-09 NOTE — Assessment & Plan Note (Signed)
Okay to increase Requip back up to 2 tablets at bedtime

## 2018-04-09 NOTE — Assessment & Plan Note (Signed)
Vicious cycle of debilitation related to morbid obesity and his comorbidities making it very difficult for him to get meaningful exercise for stamina and weight loss.  I encouraged him to try.

## 2018-04-09 NOTE — Assessment & Plan Note (Signed)
Aspiration risk.  He is pending follow-up visits at the voice clinic in [place], anticipating some sort of intervention.

## 2018-04-12 NOTE — Telephone Encounter (Signed)
Patient states his tier exception has been denied after speaking with [employer]. He is requesting another expedited appeal. Explained to patient via MyChart message and on the phone that we will need to know WHY this was denied in order to know what additional info to send to [employer]. Advised this should come to our office via fax.   He asked for eliquis samples but we have none. He was advised to call back later next week to check on this.   Notified him will let him know once denial info has been received to our office.

## 2018-04-12 NOTE — Telephone Encounter (Signed)
Received fax from [facility] that patient has been approved for Quantity Limit of 74 tablets per 30 days. Called [employer] to follow up on this was a request for tier exception and NOT QL exception. Per [employer] rep, patient's Eliquis is a tier 3 drug, which is the preferred brand tier (tier 1 and 2 are preferred generic). [employer] rep informed me that an appeal can be submitted, and they will fax a form to be completed and chart notes, etc can be submitted witht his. Patient can also check into SSA help/medicare low income subsidy.   Refer # [id number]  Patient notified via [place] message

## 2018-04-12 NOTE — Telephone Encounter (Signed)
Patient filed tier exception request on phone with [facility]. See MyChart message

## 2018-04-12 NOTE — Telephone Encounter (Signed)
Tier exception letter & last MD note faxed to [employer] @ [phone] for eliquis

## 2018-04-22 ENCOUNTER — Telehealth: Payer: Self-pay | Admitting: Internal Medicine

## 2018-04-22 ENCOUNTER — Ambulatory Visit (INDEPENDENT_AMBULATORY_CARE_PROVIDER_SITE_OTHER): Payer: Medicare Other | Admitting: Adult Health

## 2018-04-22 ENCOUNTER — Encounter: Payer: Self-pay | Admitting: Adult Health

## 2018-04-22 VITALS — BP 142/83 | HR 68 | Ht 72.0 in | Wt 374.4 lb

## 2018-04-22 DIAGNOSIS — E785 Hyperlipidemia, unspecified: Secondary | ICD-10-CM | POA: Diagnosis not present

## 2018-04-22 DIAGNOSIS — M792 Neuralgia and neuritis, unspecified: Secondary | ICD-10-CM

## 2018-04-22 DIAGNOSIS — I480 Paroxysmal atrial fibrillation: Secondary | ICD-10-CM | POA: Diagnosis not present

## 2018-04-22 DIAGNOSIS — G4733 Obstructive sleep apnea (adult) (pediatric): Secondary | ICD-10-CM

## 2018-04-22 DIAGNOSIS — I63112 Cerebral infarction due to embolism of left vertebral artery: Secondary | ICD-10-CM | POA: Diagnosis not present

## 2018-04-22 DIAGNOSIS — I10 Essential (primary) hypertension: Secondary | ICD-10-CM

## 2018-04-22 NOTE — Patient Instructions (Addendum)
Continue Eliquis (apixaban) daily  and lipitor  for secondary stroke prevention  Continue to follow up with PCP regarding cholesterol and blood pressure management   You will be called to schedule EMG / nerve conduction study due to nerve pain and numbness  Slowly increase activity tolerance - can consider doing aquatic therapy after MRI complete  Continue to monitor blood pressure at home  Maintain strict control of hypertension with blood pressure goal below 130/90, diabetes with hemoglobin A1c goal below 6.5% and cholesterol with LDL cholesterol (bad cholesterol) goal below 70 mg/dL. I also advised the patient to eat a healthy diet with plenty of whole grains, cereals, fruits and vegetables, exercise regularly and maintain ideal body weight.  Followup in the future with me in 6 months or call earlier if needed       Thank you for coming to see [place] at [facility]. I hope we have been able to provide you high quality care today.  You may receive a patient satisfaction survey over the next few weeks. We would appreciate your feedback and comments so that we may continue to improve ourselves and the health of our patients.

## 2018-04-22 NOTE — Progress Notes (Signed)
[facility] [address] [place]. [place] [address] [phone]       OFFICE FOLLOW-UP NOTE  [name] Date of Birth:  [birth year] Medical Record Number:  [id number]   HPI:   Patient is being seen today for one-year stroke follow-up visit.  He continues on Eliquis for secondary stroke prevention and atrial fibrillation without reported side effects of bleeding or bruising.  Continues on atorvastatin without side effects of myalgias.  Blood pressure today 142/83.  He does have chronic back pain with lumbar injection by Dr. Letta Pate with improvement of pain but does continue to experience numbness bilaterally and difficulty ambulating.  He states he will walk a short distance and will experience lower extremity muscle fatigue/weakness where he feels like his legs will give out on him along with increased shortness of breath.  He is aware that weight loss is imperative for his overall health and ambulation.  He does endorse 5 to 10 pound weight loss in the month of January.  He does endorse limiting mobility/activity due to increased fear of falling with any type of increased activity.  He does continue to have gait imbalance post stroke and uses a cane around house or walker outdoors for assistance. He does endorse falling multiple times without injuries over the past year.  Weight loss has been encouraged at multiple provider appointments.  He does continue to be compliant with CPAP for OSA management.  No further concerns at this time.  Denies new or worsening stroke/TIA symptoms.   History summary: Visit 04/17/2017 PS : He returns for follow-up after last visit 6 months ago. He states his noticed some improvement in his walking and balance and now mostly uses a 4 pronged cane. He however did have a cough all around Thanksgiving when he fell up the stairs. He did injure his neck and upper back but now it seems to be improving. He was prescribed tramadol but it did not  take it for long period he continues to have low back pain and he sees Dr. Read Drivers so plans to do spinal injection soon. He does stumble easily and even with a cane he feels his balance is not good and is not quite stable. Patient states that he does not like using a walker and prefers a cane instead. He remains on aspirin and eliquis but states he has no significant cardiac history cardiac stents or cardiac surgery. States his blood pressure is well controlled today is 134/75. His tolerating Lipitor well without muscle aches and pains Initial visit 10/02/2016 PS: [name] is a 73 year old Caucasian male seen today for first office follow-up visit for in-hospital admission in March 2018 for stroke. He is accompanied by his wife. History is obtained from the patient, wife, review of electronic medical records and I have personally viewed imaging for lemons and lab results [name] is a 73 y.o. male who was in his normal state of health until around 1:30 AM on 06/19/16.[name] He was in bed watching a movie when he had sudden onset left-sided head pain. He states that he then tried to stand up and found that he was leaning to the left. 911 was called and he was brought in as a code stroke. 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 There was a remote age pontine microhemorrhages noted. Transthoracic echo showed normal ejection fraction. LDL cholesterol was borderline at 77 (specimen C was 5.8. Patient was continued on his home aspirin 81 eliquis as he stated he had a bad experience on Xarelto did not want to switch to Pradaxa after discussing risks and benefits. Patient has been getting physical and occupational therapy  and noticing  improvement. Diplopia has resolved. His voice is soft and hoarse yet. Still has some intermittent tingling numbnessc in his extremities. His balance is off. But getting better. Gets dizzy and off balance particularly when he moves quickly. He mostly uses a wheeled walker and has had no falls. He can walk short distances without a walker.  . ROS:   14 system review of systems is positive for fatigue, hearing loss, trouble swallowing, blurred vision, shortness of breath, leg swelling, restless leg, apnea, snoring, sleepwalking, joint pain, back pain, walking difficulty, memory loss, dizziness, numbness and all other systems negative  PMH:  Past Medical History:  Diagnosis Date  . Anemia 07/29/2013  . Asthma    childhood asthma  . Benign essential HTN   . Benign prostatic hypertrophy    several prostate biopsies neg for cancer.   . Bilateral hip pain   . Cancer ([place])   . CHF (congestive heart failure) ([place])   . Chronic anticoagulation 07/22/2013  . Complication of anesthesia    MORPHINE CAUSES HALLUCINATIONS  . CVA (cerebral vascular accident) ([place]) 06/2016  . Depression   . Diastolic congestive heart failure ([place]) 07/16/2013  . Dysphagia, post-stroke   . Embolic cerebral infarction ([place]) 06/21/2016  . FH: colonic polyps 2010  . Gout   . History of kidney stones   . Hypertension   . Kidney cysts   . Morbid obesity ([place])   . Numbness    feet / legs  . PAF (paroxysmal atrial fibrillation) with RVR 07/29/2013  . Recurrent pulmonary emboli ([place]) 06/13/2015  . Sleep apnea    USES C PAP  . Stroke syndrome   . 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 Marital status: Married    Spouse name: Not on file  . Number of children: 1  . Years of education: Not on file  . Highest education level: Not on file  Occupational History  . Occupation: Merchant navy officer  Social Needs  . Financial resource strain: Not on file  . Food  insecurity:    Worry: Not on file    Inability: Not on file  . Transportation needs:    Medical: Not on file    Non-medical: Not on file  Tobacco Use  . Smoking status: Former Smoker    Packs/day: 0.20    Years: 13.00    Pack years: 2.60    Types: Cigarettes    Start date: 04/03/1961    Last attempt to quit: 08/30/1974    Years since quitting: 43.6  . Smokeless tobacco: Never Used  Substance and Sexual Activity  . Alcohol use: No    Alcohol/week: 0.0 standard drinks  . Drug use: No  . Sexual activity: Not Currently  Lifestyle  . Physical activity:    Days per week: Not on file    Minutes per session: Not on file  . Stress: Not on file  Relationships  . Social connections:    Talks on phone: Not  on file    Gets together: Not on file    Attends religious service: Not on file    Active member of club or organization: Not on file    Attends meetings of clubs or organizations: Not on file    Relationship status: Not on file  . Intimate partner violence:    Fear of current or ex partner: Not on file    Emotionally abused: Not on file    Physically abused: Not on file    Forced sexual activity: Not on file  Other Topics Concern  . Not on file  Social History Narrative   Lives at home w/ his wife   Right-handed   Caffeine: decaf coffee, occasional tea    Medications:   Current Outpatient Medications on File Prior to Visit  Medication Sig Dispense Refill  . atorvastatin (LIPITOR) 80 MG tablet TAKE 1 TABLET BY MOUTH EVERY DAY 90 tablet 1  . CALCIUM PO Take 500 mg by mouth 2 (two) times daily.     . Cyanocobalamin (VITAMIN B-12) 1000 MCG SUBL Place 5,000 mcg under the tongue every evening.     [name] Sodium (DSS) 100 MG CAPS Take 100 mg by mouth every other day.    [name] ELIQUIS 5 MG TABS tablet TAKE 1 TABLET BY MOUTH TWICE A DAY 180 tablet 1  . finasteride (PROSCAR) 5 MG tablet Take 1 tablet (5 mg total) by mouth daily. 30 tablet 1  . flecainide (TAMBOCOR) 50 MG tablet TAKE  1 TABLET BY MOUTH EVERY 12 HOURS 180 tablet 0  . loratadine (CLARITIN) 10 MG tablet Take 10 mg by mouth daily.    [name] LORazepam (ATIVAN) 0.5 MG tablet Take 1 tablet (0.5 mg total) by mouth at bedtime. 30 tablet 2  . montelukast (SINGULAIR) 10 MG tablet TAKE 1 TABLET BY MOUTH EVERYDAY AT BEDTIME 90 tablet 1  . pantoprazole (PROTONIX) 40 MG tablet Take 1 tablet (40 mg total) by mouth daily. 90 tablet 1  . potassium chloride SA (KLOR-CON M20) 20 MEQ tablet Take 2 tablets (40 mEq total) by mouth daily. 180 tablet 3  . rOPINIRole (REQUIP) 0.5 MG tablet 1 or 2 at bedtime for leg movement 180 tablet 3  . sertraline (ZOLOFT) 100 MG tablet Take 1.5 tablets (150 mg total) by mouth daily. 45 tablet 2  . torsemide (DEMADEX) 20 MG tablet TAKE 3 TABLETS (60 MG TOTAL) BY MOUTH DAILY. KEEP APPOINTMENT AS SCHEDULED. 270 tablet 3  . traMADol (ULTRAM) 50 MG tablet Take 1 tablet (50 mg total) by mouth 2 (two) times daily. (Patient taking differently: Take 50 mg by mouth as needed. ) 30 tablet 0   No current facility-administered medications on file prior to visit.     Allergies:   Allergies  Allergen Reactions  . Morphine Other (See Comments)    Nausea and Severe hallucinations  . Codeine Nausea Only  . Penicillins Other (See Comments)    LOC with shot  . Propoxyphene N-Acetaminophen Nausea Only    Physical Exam General: Morbid obese elderly pleasant Caucasian male, seated, in no evident distress Head: head normocephalic and atraumatic.  Neck: supple with no carotid or supraclavicular bruits Cardiovascular: regular rate and rhythm, no murmurs Musculoskeletal: no deformity Skin:  no rash/petichiae Vascular:  Normal pulses all extremities Vitals:   04/22/18 1436  BP: (!) 142/83  Pulse: 68   Neurologic Exam Mental Status: Awake and fully alert. Oriented to place and time. Recent and remote memory intact. Attention span, concentration  and fund of knowledge appropriate. Mood and affect appropriate.    Cranial Nerves: Pupils equal, briskly reactive to light. Extraocular movements full without nystagmus. Visual fields full to confrontation. Hearing intact. Facial sensation intact. Face, tongue, palate moves normally and symmetrically.  Motor: Normal bulk and tone. Normal strength in all tested extremity muscles. Sensory.: Diminished left hemibody sensation to touch ,pinprick.Marland KitchenPosition and vibratory sensation diminished bilaterally over the toes Positive Romberg's sign Coordination: Rapid alternating movements normal in all extremities. Finger-to-nose performed accurately bilaterally but unable to perform heel-to-shin accurately. Gait and Station: Arises from chair with  difficulty. Stance is broad-based. Gait demonstrates mild imbalance with use of Rollator walker.  Unsteady while standing on a narrow base and either leg unsupported.  Reflexes: 1+ and symmetric except both ankle jerks are absent and knee jerks are depressed.. Toes downgoing.     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 Multiple vascular risk factors of atrial fibrillation, obesity, diabetes, hypertension hyperlipidemia patient is being seen today for follow-up visit with continued gait difficulties and chronic back pain, sciatic pain in bilateral lower extremity numbness.    PLAN: -Continue Eliquis and Lipitor for secondary stroke prevention -f/u with PCP for HTN and HLD management -EMG/nerve conduction study to assess etiology of bilateral lower extremity numbness -Advised to slowly increase activity tolerance with possible consideration of aquatic therapy -Encouraged weight loss and congratulated on current weight loss -Maintain strict control of hypertension with blood pressure goal below 130/90, diabetes with hemoglobin A1c goal below 6.5% and cholesterol with  LDL cholesterol (bad cholesterol) goal below 70 mg/dL. I also advised the patient to eat a healthy diet with plenty of whole grains, cereals, fruits and vegetables, exercise regularly and maintain ideal body weight.  Follow-up in 6 months or call earlier if needed  Greater than 50% of time during this 30 minute visit was spent on counseling,explanation of diagnosis, planning of further management, discussion with patient and family and coordination of care  Venancio Poisson, [facility]  [facility] [address] [place], [place] [address]  Phone [phone] Fax [phone] Note: This document was prepared with digital dictation and possible smart phrase technology. Any transcriptional errors that result from this process are unintentional.

## 2018-04-22 NOTE — Telephone Encounter (Signed)
Faxed paperwork for tier exception to [facility] @ [phone] with MD note and tier exception letter

## 2018-04-23 NOTE — Progress Notes (Signed)
I agree with the above plan 

## 2018-04-24 NOTE — Telephone Encounter (Signed)
Received notice from [facility] that patient's request for Eliquis tier exception appeal has been denied. The request is denied because Eliquis is on tier 3 and this is a preferred tier. They cannot approve a tier exception for a preferred tier to a generic tier.   Patient notified via [place]

## 2018-04-24 NOTE — Telephone Encounter (Signed)
Patient called in concerning denial of tier exception. Attempted to explain to patient why the request was denied per letter received. Patient is upset that this request for tier exception of eliquis has been approved in years past and he does not understand why it is now denied. He states that he in the past a second request for tier exception or appeal to denial is often required and he is requesting that we submit for this. Notified patient that I will work on this but it will likely be Friday at the earliest. He voiced understanding. He is requesting samples of Eliquis.   Advised patient to look into patient assistance as well.

## 2018-04-25 ENCOUNTER — Encounter (INDEPENDENT_AMBULATORY_CARE_PROVIDER_SITE_OTHER): Payer: Medicare Other | Admitting: Diagnostic Neuroimaging

## 2018-04-25 ENCOUNTER — Ambulatory Visit (INDEPENDENT_AMBULATORY_CARE_PROVIDER_SITE_OTHER): Payer: Medicare Other | Admitting: Diagnostic Neuroimaging

## 2018-04-25 DIAGNOSIS — Z0289 Encounter for other administrative examinations: Secondary | ICD-10-CM

## 2018-04-25 DIAGNOSIS — M792 Neuralgia and neuritis, unspecified: Secondary | ICD-10-CM

## 2018-04-25 NOTE — Telephone Encounter (Signed)
Samples of eliquis left for patient to pick up x3 boxes Lot: [id number] Exp: June 2022  Notified via [place]

## 2018-04-26 NOTE — Telephone Encounter (Signed)
Faxed tier exception denial appeal to [facility] - [phone] with letter of denial, updated letter composed on patient's behalf, MD note, completed tier exception form.   Patient notified

## 2018-05-02 NOTE — Procedures (Signed)
[facility]  NCS (NERVE CONDUCTION STUDY) WITH EMG (ELECTROMYOGRAPHY) REPORT   STUDY DATE: 04/25/18 PATIENT NAME: [name] DOB: 24-Mar-1946 MRN: [id number]  ORDERING CLINICIAN: Venancio Poisson, NP / Antony Contras, MD   TECHNOLOGIST: Sherre Scarlet ELECTROMYOGRAPHER: Earlean Polka. , MD  CLINICAL INFORMATION: 73 year old male with lower extremity numbness.  FINDINGS: NERVE CONDUCTION STUDY: Right ulnar motor responses normal distal latency, normal amplitude, normal conduction velocity with stimulation above the elbow, slow conduction velocity with stimulation above the elbow (40 m/s).  Right peroneal and right tibial motor responses could not be obtained.  Right ulnar sensory response has slightly prolonged peak latency and normal amplitude.  Right sural and right superficial peroneal sensory responses could not obtain.   NEEDLE ELECTROMYOGRAPHY:  Needle examination of right lower extremity vastus medialis is normal.  Right tibialis anterior shows 1+ fibrillation potentials and decreased recruitment of large motor units on exertion.  Right gastrocnemius muscle has no abnormal spontaneous activity at rest and decreased recruitment of large motor units on exertion.  Lumbar paraspinal muscles were deferred as patient is on anticoagulation.   IMPRESSION:   Abnormal study demonstrating: - Severe axonal, length dependent sensorimotor polyneuropathy.     INTERPRETING PHYSICIAN:  Penni Bombard, MD Certified in Neurology, Neurophysiology and Neuroimaging  [facility] [address] [place], [place] [address] [phone]  [facility]    Nerve / Sites Muscle Latency Ref. Amplitude Ref. Rel Amp Segments Distance Velocity Ref. Area    ms ms mV mV %  cm m/s m/s mVms  R Ulnar - ADM     Wrist ADM 2.7 ?3.3 8.9 ?6.0 100 Wrist - ADM 7   24.0     B.Elbow ADM 7.7  4.9  54.6 B.Elbow - Wrist 20 40 ?49 14.4     A.Elbow ADM 9.5   7.1  146 A.Elbow - B.Elbow 10 56 ?49 21.7         A.Elbow - Wrist      R Peroneal - EDB     Ankle EDB NR ?6.5 NR ?2.0 NR Ankle - EDB 9   NR  R Tibial - AH     Ankle AH NR ?5.8 NR ?4.0 NR Ankle - AH 9   NR           SNC    Nerve / Sites Rec. Site Peak Lat Ref.  Amp Ref. Segments Distance    ms ms V V  cm  R Sural - Ankle (Calf)     Calf Ankle NR ?4.4 NR ?6 Calf - Ankle 14  R Superficial peroneal - Ankle     Lat leg Ankle NR ?4.4 NR ?6 Lat leg - Ankle 14  R Ulnar - Orthodromic, (Dig V, Mid palm)     Dig V Wrist 3.2 ?3.1 5 ?5 Dig V - Wrist 33           F  Wave    Nerve F Lat Ref.   ms ms  R Ulnar - ADM 30.0 ?32.0       EMG full       EMG Summary Table    Spontaneous MUAP Recruitment  Muscle IA Fib PSW Fasc Other Amp Dur. Poly Pattern  R. Vastus medialis Normal None None None _______ Normal Normal Normal Normal  R. Tibialis anterior Increased 1+ None None _______ Increased Normal Normal Reduced  R. Gastrocnemius (Medial head) Normal None None None _______ Increased Normal Normal  Reduced

## 2018-05-06 NOTE — Telephone Encounter (Signed)
For appeal # [id number], the decision for appeal is unfavorable. This means [employer] prescription insurance is NOT required to provide a tiering exception for Eliquis according to Medicare rules.   Since Eliquis is a brand drug on tier 3 of plan's formulary, eligibility for a tiering exception required the presence of at least 1 similar lower tier brand drug on the formulary to treat the condition. There are no similar brand drugs on the lower tiers of the formulary   Patient notified via [place]

## 2018-05-08 ENCOUNTER — Telehealth: Payer: Self-pay | Admitting: *Deleted

## 2018-05-08 NOTE — Telephone Encounter (Signed)
-----   Message from [name] Poisson, NP sent at 05/08/2018  6:58 AM EST ----- Please advise patient that his recent EMG/nerve conduction study did show severe neuropathy but did not show evidence of nerve impingement from his spine

## 2018-05-08 NOTE — Telephone Encounter (Signed)
Spoke with pt. and reviewed below EMG/NCV results.  He verbalized understanding of same/fim

## 2018-05-15 ENCOUNTER — Encounter (HOSPITAL_COMMUNITY): Payer: Self-pay | Admitting: Psychiatry

## 2018-05-15 ENCOUNTER — Encounter: Payer: Self-pay | Admitting: Internal Medicine

## 2018-05-15 ENCOUNTER — Ambulatory Visit (INDEPENDENT_AMBULATORY_CARE_PROVIDER_SITE_OTHER): Payer: Medicare Other | Admitting: Psychiatry

## 2018-05-15 VITALS — BP 137/81 | HR 82 | Wt 383.0 lb

## 2018-05-15 DIAGNOSIS — I63112 Cerebral infarction due to embolism of left vertebral artery: Secondary | ICD-10-CM | POA: Diagnosis not present

## 2018-05-15 DIAGNOSIS — F411 Generalized anxiety disorder: Secondary | ICD-10-CM | POA: Diagnosis not present

## 2018-05-15 DIAGNOSIS — F330 Major depressive disorder, recurrent, mild: Secondary | ICD-10-CM | POA: Diagnosis not present

## 2018-05-15 MED ORDER — SERTRALINE HCL 100 MG PO TABS: 150.0000 mg | ORAL_TABLET | Freq: Every day | ORAL | 1 refills | Status: DC

## 2018-05-15 MED ORDER — LORAZEPAM 0.5 MG PO TABS: 0.5000 mg | ORAL_TABLET | Freq: Every day | ORAL | 1 refills | Status: DC

## 2018-05-15 NOTE — Progress Notes (Signed)
BH MD/PA/NP OP Progress Note  05/15/2018 2:17 PM [name]  MRN:  [id number]  Chief Complaint: I am doing fine.  I get tired easily.  HPI: [name] came for his appointment.  He is taking Zoloft and Ativan.  He denies any depression but admitted tired and fatigued easily.  He is trying to lose weight but he is disappointed that he has not lost as much.  Patient has multiple health issues.  He needed cane to help his balance and walking.  He is pleased that he is able to drive.  He denies any crying spells, anhedonia, feeling of hopelessness or any suicidal thoughts.  He feels the current medicine working.  He see his 80-year-old granddaughter frequently and he enjoys the company.  His son is now moving to [place].  Patient denies drinking or using any illegal substances.  He like to continue his medication.  Visit Diagnosis:    ICD-10-CM   1. Generalized anxiety disorder F41.1 LORazepam (ATIVAN) 0.5 MG tablet  2. Major depressive disorder, recurrent episode, mild (HCC) F33.0 sertraline (ZOLOFT) 100 MG tablet    LORazepam (ATIVAN) 0.5 MG tablet    Past Psychiatric History: Reviewed H/O one psychiatric admission in 2006 for depression and having suicidal thoughts. Took Risperdal but discontinued after the stroke in March 2018.  Past Medical History:  Past Medical History:  Diagnosis Date  . Anemia 07/29/2013  . Asthma    childhood asthma  . Benign essential HTN   . Benign prostatic hypertrophy    several prostate biopsies neg for cancer.   . Bilateral hip pain   . Cancer ([place])   . CHF (congestive heart failure) ([place])   . Chronic anticoagulation 07/22/2013  . Complication of anesthesia    MORPHINE CAUSES HALLUCINATIONS  . CVA (cerebral vascular accident) ([place]) 06/2016  . Depression   . Diastolic congestive heart failure ([place]) 07/16/2013  . Dysphagia, post-stroke   . 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 FH: colonic polyps 2010  . Gout   . History of kidney stones   .  Hypertension   . Kidney cysts   . Morbid obesity ([place])   . Numbness    feet / legs  . PAF (paroxysmal atrial fibrillation) with RVR 07/29/2013  . Recurrent pulmonary emboli ([place]) 06/13/2015  . Sleep apnea    USES C PAP  . Stroke syndrome   . 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 ARTHROSCOPY KNEE W/ DRILLING    . BREATH TEK H PYLORI N/A 12/23/2012   Procedure: BREATH TEK H PYLORI;  Surgeon: Gayland Curry, MD;  Location: [facility];  Service: General;  Laterality: N/A;  . CHOLECYSTECTOMY  1989  . COLONOSCOPY N/A 07/31/2013   Procedure: COLONOSCOPY;  Surgeon: Gatha Mayer, MD;  Location: [place];  Service: Endoscopy;  Laterality: N/A;  . ESOPHAGOGASTRODUODENOSCOPY N/A 07/22/2013   Procedure: ESOPHAGOGASTRODUODENOSCOPY (EGD);  Surgeon: Irene Shipper, MD;  Location: [facility] ENDOSCOPY;  Service: Endoscopy;  Laterality: N/A;  . ESOPHAGOGASTRODUODENOSCOPY N/A 07/31/2013   Procedure: ESOPHAGOGASTRODUODENOSCOPY (EGD);  Surgeon: Gatha Mayer, MD;  Location[facility] ENDOSCOPY;  Service: Endoscopy;  Laterality: N/A;  . INTRAOPERATIVE TRANSESOPHAGEAL ECHOCARDIOGRAM N/A 07/18/2013   Procedure: INTRAOPERATIVE TRANSESOPHAGEAL ECHOCARDIOGRAM;  Surgeon: Grace Isaac, MD;  Location: [place];  Service: Open Heart Surgery;  Laterality: N/A;  . LAPAROSCOPIC GASTRIC BANDING WITH HIATAL HERNIA REPAIR N/A 04/08/2013   Procedure: LAPAROSCOPIC GASTRIC BANDING WITH possible  HIATAL HERNIA REPAIR;  Surgeon: Gayland Curry, MD;  Location: [facility];  Service: General;  Laterality: N/A;  . LITHOTRIPSY    . renal calculi    . SUBXYPHOID PERICARDIAL WINDOW N/A 07/18/2013   Procedure: SUBXYPHOID PERICARDIAL WINDOW;  Surgeon: Grace Isaac, MD;  Location: [place];  Service: Thoracic;  Laterality: N/A;  . TONSILLECTOMY    . total knee replacment     bilat  . trigger finger repair     also lue, cts surgically repaired  . WISDOM TOOTH EXTRACTION      Family Psychiatric  History: Reviewed.  Family History:  Family History  Problem Relation Age of Onset  . Depression Mother   . Hypertension Mother   . Dementia Mother   . Heart disease Father        MI  . Depression Brother   . Stroke Paternal Aunt        CVA  . Diabetes Paternal Uncle   . Heart disease Paternal Uncle        MI  . Heart disease Paternal Grandmother        MI  . Diabetes Cousin        PATERNAL    Social History:  Social History   Socioeconomic History  . Marital status: Married    Spouse name: Not on file  . Number of children: 1  . Years of education: Not on file  . Highest education level: Not on file  Occupational History  . Occupation: Merchant navy officer  Social Needs  . Financial resource strain: Not on file  . Food insecurity:    Worry: Not on file    Inability: Not on file  . Transportation needs:    Medical: Not on file    Non-medical: Not on file  Tobacco Use  . Smoking status: Former Smoker    Packs/day: 0.20    Years: 13.00    Pack years: 2.60    Types: Cigarettes    Start date: 04/03/1961    Last attempt to quit: 08/30/1974    Years since quitting: 43.7  . Smokeless tobacco: Never Used  Substance and Sexual Activity  . Alcohol use: No    Alcohol/week: 0.0 standard drinks  . Drug use: No  . Sexual activity: Not Currently  Lifestyle  . Physical activity:    Days per week: Not on file    Minutes per session: Not on file  . Stress: Not on file  Relationships  . Social connections:    Talks on phone: Not on file    Gets together: Not on file    Attends religious service: Not on file    Active member of club or organization: Not on file    Attends meetings of clubs or organizations: Not on file    Relationship status: Not on file  Other Topics Concern  . Not on file  Social History Narrative   Lives at home w/ his wife   Right-handed   Caffeine: decaf coffee, occasional tea    Allergies:  Allergies  Allergen Reactions  . Morphine  Other (See Comments)    Nausea and Severe hallucinations  . Codeine Nausea Only  . Penicillins Other (See Comments)    LOC with shot  . Propoxyphene N-Acetaminophen Nausea Only    Metabolic Disorder Labs: Lab Results  Component Value Date   HGBA1C 6.3 11/13/2017   MPG 120 06/19/2016   MPG 128 (H) 12/05/2012   No results found for: PROLACTIN Lab Results  Component  Value Date   CHOL 151 11/13/2017   TRIG 283.0 (H) 11/13/2017   HDL 30.20 (L) 11/13/2017   CHOLHDL 5 11/13/2017   VLDL 56.6 (H) 11/13/2017   LDLCALC 77 06/19/2016   LDLCALC 81 04/26/2016   Lab Results  Component Value Date   TSH 4.50 11/13/2017   TSH 2.10 10/24/2016    Therapeutic Level Labs: No results found for: LITHIUM No results found for: VALPROATE No components found for:  CBMZ  Current Medications: Current Outpatient Medications  Medication Sig Dispense Refill  . atorvastatin (LIPITOR) 80 MG tablet TAKE 1 TABLET BY MOUTH EVERY DAY 90 tablet 1  . CALCIUM PO Take 500 mg by mouth 2 (two) times daily.     . Cyanocobalamin (VITAMIN B-12) 1000 MCG SUBL Place 5,000 mcg under the tongue every evening.     [name] Sodium (DSS) 100 MG CAPS Take 100 mg by mouth every other day.    [name] ELIQUIS 5 MG TABS tablet TAKE 1 TABLET BY MOUTH TWICE A DAY 180 tablet 1  . finasteride (PROSCAR) 5 MG tablet Take 1 tablet (5 mg total) by mouth daily. 30 tablet 1  . flecainide (TAMBOCOR) 50 MG tablet TAKE 1 TABLET BY MOUTH EVERY 12 HOURS 180 tablet 0  . loratadine (CLARITIN) 10 MG tablet Take 10 mg by mouth daily.    [name] LORazepam (ATIVAN) 0.5 MG tablet Take 1 tablet (0.5 mg total) by mouth at bedtime. 30 tablet 2  . montelukast (SINGULAIR) 10 MG tablet TAKE 1 TABLET BY MOUTH EVERYDAY AT BEDTIME 90 tablet 1  . pantoprazole (PROTONIX) 40 MG tablet Take 1 tablet (40 mg total) by mouth daily. 90 tablet 1  . potassium chloride SA (KLOR-CON M20) 20 MEQ tablet Take 2 tablets (40 mEq total) by mouth daily. 180 tablet 3  . rOPINIRole  (REQUIP) 0.5 MG tablet 1 or 2 at bedtime for leg movement 180 tablet 3  . sertraline (ZOLOFT) 100 MG tablet Take 1.5 tablets (150 mg total) by mouth daily. 45 tablet 2  . torsemide (DEMADEX) 20 MG tablet TAKE 3 TABLETS (60 MG TOTAL) BY MOUTH DAILY. KEEP APPOINTMENT AS SCHEDULED. 270 tablet 3  . traMADol (ULTRAM) 50 MG tablet Take 1 tablet (50 mg total) by mouth 2 (two) times daily. (Patient taking differently: Take 50 mg by mouth as needed. ) 30 tablet 0   No current facility-administered medications for this visit.      Musculoskeletal: Strength & Muscle Tone: decreased Gait & Station: walk with cain Patient leans: Right  Psychiatric Specialty Exam: ROS  Blood pressure 137/81, pulse 82, weight (!) 383 lb (173.7 kg), SpO2 96 %.Body mass index is 51.94 kg/m.  General Appearance: Casual and obese  Eye Contact:  Good  Speech:  Slow  Volume:  Normal  Mood:  Anxious and tired  Affect:  Appropriate  Thought Process:  Goal Directed  Orientation:  Full (Time, Place, and Person)  Thought Content: Logical   Suicidal Thoughts:  No  Homicidal Thoughts:  No  Memory:  Immediate;   Fair Recent;   Fair Remote;   Fair  Judgement:  Good  Insight:  Good  Psychomotor Activity:  Decreased  Concentration:  Concentration: Fair and Attention Span: Fair  Recall:  Good  Fund of Knowledge: Good  Language: Good  Akathisia:  No  Handed:  Right  AIMS (if indicated): not done  Assets:  Communication Skills Desire for Improvement Housing Resilience Social Support  ADL's:  Intact  Cognition: WNL  Sleep:  Fair   Screenings: PHQ2-9     [facility] Visit from 03/30/2014 in [facility] Visit from 03/07/2013 in [place] at  [employer] Total Score  0  0   0  0  0  PHQ-9 Total Score  -  2  -  -  -       Assessment and Plan: Generalized anxiety disorder.  Major depressive disorder, recurrent.  Patient is a stable on his current medication.  He does not have any side effects.  He is not interested in therapy.  Continue Zoloft and 50 mg daily and lorazepam 0.5 mg at bedtime.  Discussed medication side effects and benefits.  Recommended to call us back if is any question or any concern.  Follow-up in 6 months.   Kathlee Nations, MD 05/15/2018, 2:17 PM

## 2018-05-15 NOTE — Progress Notes (Addendum)
Subjective:   [name] is a 73 y.o. male who presents for an Initial Medicare Annual Wellness Visit.  Review of Systems  No ROS.  Medicare Wellness Visit. Additional risk factors are reflected in the social history.      Cardiac Risk Factors include: advanced age (>66men, >77 women);hypertension;dyslipidemia;male gender Sleep patterns: gets up 1-2 times nightly to void and sleeps 6-7 hours nightly. Wears C-PAP  Home Safety/Smoke Alarms: Feels safe in home. Smoke alarms in place.  Living environment; residence and Firearm Safety: 1-story house/ trailer, equipment: Radio producer, Type: Single Point [place], [employer], Type: Wellsite geologist, Type: Tub Surveyor, quantity, no firearms, firearms stored safely. Lives with wife no needs for DME, good support system Seat Belt Safety/Bike Helmet: Wears seat belt.   PSA-  Lab Results  Component Value Date   PSA 0.68 12/24/2015   PSA 0.95 10/20/2015   PSA 0.54 12/18/2014      Objective:    Today's Vitals   05/16/18 1402 05/16/18 1648  BP: 136/80   Pulse: 86   SpO2: 97%   Weight: (!) 381 lb (172.8 kg)   Height: 6' (1.829 m)   PainSc:  2    Body mass index is 51.67 kg/m.  Advanced Directives 05/16/2018 12/12/2016 12/12/2016 11/03/2016 09/08/2016 09/08/2016 07/28/2016  Does Patient Have a Medical Advance Directive? No No No No No No No  Would patient like information on creating a medical advance directive? Yes (ED - Information included in AVS) - - - - - No - Patient declined  Pre-existing out of facility DNR order (yellow form or pink MOST form) - - - - - - -  Some encounter information is confidential and restricted. Go to Review Flowsheets activity to see all data.    Current Medications (verified) Outpatient Encounter Medications as of 05/16/2018  Medication Sig  . atorvastatin (LIPITOR) 80 MG tablet TAKE 1 TABLET BY MOUTH EVERY DAY  . Cyanocobalamin (VITAMIN B-12) 1000 MCG SUBL Place 5,000 mcg under the tongue every evening.   [name] Sodium (DSS) 100 MG CAPS Take 100 mg by mouth every other day.  [name] ELIQUIS 5 MG TABS tablet TAKE 1 TABLET BY MOUTH TWICE A DAY  . finasteride (PROSCAR) 5 MG tablet Take 1 tablet (5 mg total) by mouth daily.  . flecainide (TAMBOCOR) 50 MG tablet TAKE 1 TABLET BY MOUTH EVERY 12 HOURS  . loratadine (CLARITIN) 10 MG tablet Take 10 mg by mouth daily.  [name] LORazepam (ATIVAN) 0.5 MG tablet Take 1 tablet (0.5 mg total) by mouth at bedtime.  . montelukast (SINGULAIR) 10 MG tablet TAKE 1 TABLET BY MOUTH EVERYDAY AT BEDTIME  . pantoprazole (PROTONIX) 40 MG tablet Take 1 tablet (40 mg total) by mouth daily.  . potassium chloride SA (KLOR-CON M20) 20 MEQ tablet Take 2 tablets (40 mEq total) by mouth daily.  [name] rOPINIRole (REQUIP) 0.5 MG tablet 1 or 2 at bedtime for leg movement  . sertraline (ZOLOFT) 100 MG tablet Take 1.5 tablets (150 mg total) by mouth daily.  [name] torsemide (DEMADEX) 20 MG tablet TAKE 3 TABLETS (60 MG TOTAL) BY MOUTH DAILY. KEEP APPOINTMENT AS SCHEDULED.  [name] traMADol (ULTRAM) 50 MG tablet Take 1 tablet (50 mg total) by mouth 2 (two) times daily. (Patient taking differently: Take 50 mg by mouth as needed. )  . [DISCONTINUED] CALCIUM PO Take 500 mg by mouth 2 (two) times daily.    No facility-administered encounter medications on file as of 05/16/2018.  Allergies (verified) Morphine; Codeine; Penicillins; and Propoxyphene n-acetaminophen   History: Past Medical History:  Diagnosis Date  . Anemia 07/29/2013  . Asthma    childhood asthma  . Benign essential HTN   . Benign prostatic hypertrophy    several prostate biopsies neg for cancer.   . Bilateral hip pain   . Cancer ([place])   . CHF (congestive heart failure) ([place])   . Chronic anticoagulation 07/22/2013  . Complication of anesthesia    MORPHINE CAUSES HALLUCINATIONS  . CVA (cerebral vascular accident) ([place]) 06/2016  . Depression   . Diastolic congestive heart failure ([place]) 07/16/2013  . Dysphagia, post-stroke   . Embolic  cerebral infarction ([place]) 06/21/2016  . FH: colonic polyps 2010  . Gout   . History of kidney stones   . Hypertension   . Kidney cysts   . Morbid obesity ([place])   . Numbness    feet / legs  . PAF (paroxysmal atrial fibrillation) with RVR 07/29/2013  . Recurrent pulmonary emboli ([place]) 06/13/2015  . Sleep apnea    USES C PAP  . Stroke syndrome   . 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 ARTHROSCOPY KNEE W/ DRILLING    . BREATH TEK H PYLORI N/A 12/23/2012   Procedure: BREATH TEK H PYLORI;  Surgeon: Gayland Curry, MD;  Location[facility];  Service: General;  Laterality: N/A;  . CHOLECYSTECTOMY  1989  . COLONOSCOPY N/A 07/31/2013   Procedure: COLONOSCOPY;  Surgeon: Gatha Mayer, MD;  Location: [place];  Service: Endoscopy;  Laterality: N/A;  . ESOPHAGOGASTRODUODENOSCOPY N/A 07/22/2013   Procedure: ESOPHAGOGASTRODUODENOSCOPY (EGD);  Surgeon: Irene Shipper, MD;  [facility] ENDOSCOPY;  Service: Endoscopy;  Laterality: N/A;  . ESOPHAGOGASTRODUODENOSCOPY N/A 07/31/2013   Procedure: ESOPHAGOGASTRODUODENOSCOPY (EGD);  Surgeon: Gatha Mayer, MD;  Location: [facility] ENDOSCOPY;  Service: Endoscopy;  Laterality: N/A;  . INTRAOPERATIVE TRANSESOPHAGEAL ECHOCARDIOGRAM N/A 07/18/2013   Procedure: INTRAOPERATIVE TRANSESOPHAGEAL ECHOCARDIOGRAM;  Surgeon: Grace Isaac, MD;  Location: [place];  Service: Open Heart Surgery;  Laterality: N/A;  . LAPAROSCOPIC GASTRIC BANDING WITH HIATAL HERNIA REPAIR N/A 04/08/2013   Procedure: LAPAROSCOPIC GASTRIC BANDING WITH possible  HIATAL HERNIA REPAIR;  Surgeon: Gayland Curry, MD;  Location: [facility];  Service: General;  Laterality: N/A;  . LITHOTRIPSY    . renal calculi    . SUBXYPHOID PERICARDIAL WINDOW N/A 07/18/2013   Procedure: SUBXYPHOID PERICARDIAL WINDOW;  Surgeon: Grace Isaac, MD;  Location: [place];  Service: Thoracic;  Laterality: N/A;  . TONSILLECTOMY    . total knee replacment     bilat  . trigger finger  repair     also lue, cts surgically repaired  . WISDOM TOOTH EXTRACTION     Family History  Problem Relation Age of Onset  . Depression Mother   . Hypertension Mother   . Dementia Mother   . Heart disease Father        MI  . Depression Brother   . Stroke Paternal Aunt        CVA  . Diabetes Paternal Uncle   . Heart disease Paternal Uncle        MI  . Heart disease Paternal Grandmother        MI  . Diabetes Cousin        PATERNAL   Social History   Socioeconomic History  . Marital status: Married    Spouse name: Not on file  . Number of children:  1  . Years of education: Not on file  . Highest education level: Not on file  Occupational History  . Occupation: Merchant navy officer  Social Needs  . Financial resource strain: Not very hard  . Food insecurity:    Worry: Never true    Inability: Never true  . Transportation needs:    Medical: No    Non-medical: No  Tobacco Use  . Smoking status: Former Smoker    Packs/day: 0.20    Years: 13.00    Pack years: 2.60    Types: Cigarettes    Start date: 04/03/1961    Last attempt to quit: 08/30/1974    Years since quitting: 43.7  . Smokeless tobacco: Never Used  Substance and Sexual Activity  . Alcohol use: No    Alcohol/week: 0.0 standard drinks  . Drug use: No  . Sexual activity: Not Currently  Lifestyle  . Physical activity:    Days per week: 0 days    Minutes per session: 0 min  . Stress: To some extent  Relationships  . Social connections:    Talks on phone: More than three times a week    Gets together: Once a week    Attends religious service: 1 to 4 times per year    Active member of club or organization: Yes    Attends meetings of clubs or organizations: 1 to 4 times per year    Relationship status: Married  Other Topics Concern  . Not on file  Social History Narrative   Lives at home w/ his wife   Right-handed   Caffeine: decaf coffee, occasional tea   Tobacco Counseling Counseling given:  Not Answered  Activities of Daily Living In your present state of health, do you have any difficulty performing the following activities: 05/16/2018  Hearing? N  Vision? N  Difficulty concentrating or making decisions? N  Walking or climbing stairs? Y  Dressing or bathing? Y  Doing errands, shopping? Y  Preparing Food and eating ? Y  Using the Toilet? N  In the past six months, have you accidently leaked urine? N  Do you have problems with loss of bowel control? N  Managing your Medications? N  Managing your Finances? N  Housekeeping or managing your Housekeeping? Y  Some recent data might be hidden     Immunizations and Health Maintenance Immunization History  Administered Date(s) Administered  . Influenza, High Dose Seasonal PF 01/20/2016, 01/31/2017, 03/06/2018  . Influenza,inj,Quad PF,6+ Mos 04/09/2013  . Influenza-Unspecified 03/08/2018  . Pneumococcal Conjugate-13 10/20/2015  . Pneumococcal Polysaccharide-23 04/09/2013  . Td 11/02/2009  . Tdap 01/20/2016   There are no preventive care reminders to display for this patient.  Patient Care Team: Janith Lima, MD as PCP - General (Internal Medicine) Himmelrich, Bryson Ha, RD (Inactive) as Dietitian Tia Masker) Leta Baptist, MD as Consulting Physician (Otolaryngology) Kathlee Nations, MD as Consulting Physician (Psychiatry) Debara Pickett [name], MD as Consulting Physician (Cardiology) Chyrel Masson, DO (Otolaryngology) Deneise Lever, MD as Consulting Physician (Pulmonary Disease)  Indicate any recent Medical Services you may have received from other than [facility] providers in the past year (date may be approximate).    Assessment:   This is a routine wellness examination for [place]. Physical assessment deferred to PCP.  Hearing/Vision screen Hearing Screening Comments: Able to hear conversational tones w/o difficulty. No issues reported.  Passed whisper test Vision Screening Comments: appointment yearly  Dietary issues  and exercise activities discussed: Current Exercise Habits: The  patient does not participate in regular exercise at present(chair exercises provided), Exercise limited by: orthopedic condition(s);respiratory conditions(s);Other - see comments(morbid obesity)  Diet (meal preparation, eat out, water intake, caffeinated beverages, dairy products, fruits and vegetables): in general, a "healthy" diet  , well balanced   Discussed weight loss strategies and diet. Reviewed heart healthy and diabetic diet. Encouraged patient to increase daily water and healthy fluid intake. Relevant patient education assigned to patient using Emmi.  Goals    . Patient Stated     I want to lose weight by increasing my physical activity by doing chair exercises.       Depression Screen PHQ 2/9 Scores 05/16/2018 10/01/2017 07/28/2016 02/16/2015  PHQ - 2 Score 2 0 0 0  PHQ- 9 Score 6 - 2 -    Fall Risk Fall Risk  05/16/2018 01/03/2018 10/01/2017 06/26/2017 04/19/2017  Falls in the past year? 1 Yes Yes No No  Comment - - - - -  Number falls in past yr: 1 1 2  or more - -  Injury with Fall? 1 No No - -  Comment - - - - -  Risk Factor Category  - - - - -  Risk for fall due to : Impaired balance/gait;Impaired mobility - - - -  Follow up Falls prevention discussed;Education provided - - - -   Cognitive Function:       Ad8 score reviewed for issues:  Issues making decisions: no  Less interest in hobbies / activities: no  Repeats questions, stories (family complaining): no  Trouble using ordinary gadgets (microwave, computer, phone):no  Forgets the month or year: no  Mismanaging finances: no  Remembering appts: no  Daily problems with thinking and/or memory: no Ad8 score is= 0  Screening Tests Health Maintenance  Topic Date Due  . COLONOSCOPY  08/01/2023  . TETANUS/TDAP  01/19/2026  . INFLUENZA VACCINE  Completed  . Hepatitis C Screening  Completed  . PNA vac Low Risk Adult  Completed      Plan:       Reviewed health maintenance screenings with patient today and relevant education, vaccines, and/or referrals were provided.   Continue doing brain stimulating activities (puzzles, reading, adult coloring books, staying active) to keep memory sharp.   Continue to eat heart healthy diet (full of fruits, vegetables, whole grains, lean protein, water--limit salt, fat, and sugar intake) and increase physical activity as tolerated.  I have personally reviewed and noted the following in the patient's chart:   . Medical and social history . Use of alcohol, tobacco or illicit drugs  . Current medications and supplements . Functional ability and status . Nutritional status . Physical activity . Advanced directives . List of other physicians . Vitals . Screenings to include cognitive, depression, and falls . Referrals and appointments  In addition, I have reviewed and discussed with patient certain preventive protocols, quality metrics, and best practice recommendations. A written personalized care plan for preventive services as well as general preventive health recommendations were provided to patient.     Michiel Cowboy, RN   05/16/2018    Medical screening examination/treatment/procedure(s) were performed by non-physician practitioner and as supervising physician I was immediately available for consultation/collaboration. I agree with above. [name], MD

## 2018-05-16 ENCOUNTER — Ambulatory Visit (INDEPENDENT_AMBULATORY_CARE_PROVIDER_SITE_OTHER): Payer: Medicare Other | Admitting: *Deleted

## 2018-05-16 ENCOUNTER — Ambulatory Visit (INDEPENDENT_AMBULATORY_CARE_PROVIDER_SITE_OTHER): Payer: Medicare Other | Admitting: Internal Medicine

## 2018-05-16 ENCOUNTER — Other Ambulatory Visit (INDEPENDENT_AMBULATORY_CARE_PROVIDER_SITE_OTHER): Payer: Medicare Other

## 2018-05-16 ENCOUNTER — Encounter: Payer: Self-pay | Admitting: Internal Medicine

## 2018-05-16 VITALS — BP 136/80 | HR 86 | Temp 98.0°F | Resp 16 | Ht 72.0 in | Wt 381.8 lb

## 2018-05-16 VITALS — BP 136/80 | HR 86 | Ht 72.0 in | Wt 381.0 lb

## 2018-05-16 DIAGNOSIS — I63112 Cerebral infarction due to embolism of left vertebral artery: Secondary | ICD-10-CM | POA: Diagnosis not present

## 2018-05-16 DIAGNOSIS — R7303 Prediabetes: Secondary | ICD-10-CM

## 2018-05-16 DIAGNOSIS — Z0000 Encounter for general adult medical examination without abnormal findings: Secondary | ICD-10-CM | POA: Diagnosis not present

## 2018-05-16 DIAGNOSIS — I10 Essential (primary) hypertension: Secondary | ICD-10-CM

## 2018-05-16 DIAGNOSIS — E785 Hyperlipidemia, unspecified: Secondary | ICD-10-CM

## 2018-05-16 DIAGNOSIS — E538 Deficiency of other specified B group vitamins: Secondary | ICD-10-CM | POA: Insufficient documentation

## 2018-05-16 LAB — CBC WITH DIFFERENTIAL/PLATELET
Basophils Absolute: 0.1 10*3/uL (ref 0.0–0.1)
Basophils Relative: 1.2 % (ref 0.0–3.0)
Eosinophils Absolute: 0.1 10*3/uL (ref 0.0–0.7)
Eosinophils Relative: 1 % (ref 0.0–5.0)
HCT: 43.5 % (ref 39.0–52.0)
HEMOGLOBIN: 14.2 g/dL (ref 13.0–17.0)
Lymphocytes Relative: 15.5 % (ref 12.0–46.0)
Lymphs Abs: 1.8 10*3/uL (ref 0.7–4.0)
MCHC: 32.6 g/dL (ref 30.0–36.0)
MCV: 89.8 fl (ref 78.0–100.0)
MONOS PCT: 10.6 % (ref 3.0–12.0)
Monocytes Absolute: 1.2 10*3/uL — ABNORMAL HIGH (ref 0.1–1.0)
Neutro Abs: 8.4 10*3/uL — ABNORMAL HIGH (ref 1.4–7.7)
Neutrophils Relative %: 71.7 % (ref 43.0–77.0)
Platelets: 232 10*3/uL (ref 150.0–400.0)
RBC: 4.84 Mil/uL (ref 4.22–5.81)
RDW: 14.1 % (ref 11.5–15.5)
WBC: 11.7 10*3/uL — ABNORMAL HIGH (ref 4.0–10.5)

## 2018-05-16 LAB — FOLATE: Folate: >24 ng/mL (ref >5.9)

## 2018-05-16 LAB — TSH: TSH: 2.04 u[IU]/mL (ref 0.35–4.50)

## 2018-05-16 LAB — COMPREHENSIVE METABOLIC PANEL
ALT: 17 U/L (ref 0–53)
AST: 21 U/L (ref 0–37)
Albumin: 3.6 g/dL (ref 3.5–5.2)
Alkaline Phosphatase: 111 U/L (ref 39–117)
BUN: 22 mg/dL (ref 6–23)
CO2: 32 mEq/L (ref 19–32)
Calcium: 9.1 mg/dL (ref 8.4–10.5)
Chloride: 100 mEq/L (ref 96–112)
Creatinine, Ser: 1.41 mg/dL (ref 0.40–1.50)
GFR: 49.35 mL/min — ABNORMAL LOW (ref >60.00)
Glucose, Bld: 84 mg/dL (ref 70–99)
Potassium: 3.7 mEq/L (ref 3.5–5.1)
Sodium: 142 mEq/L (ref 135–145)
Total Bilirubin: 0.8 mg/dL (ref 0.2–1.2)
Total Protein: 6.9 g/dL (ref 6.0–8.3)

## 2018-05-16 LAB — LIPID PANEL
Cholesterol: 139 mg/dL (ref 0–200)
HDL: 29.7 mg/dL — ABNORMAL LOW (ref >39.00)
NonHDL: 109.64
Total CHOL/HDL Ratio: 5
Triglycerides: 203 mg/dL — ABNORMAL HIGH (ref 0.0–149.0)
VLDL: 40.6 mg/dL — AB (ref 0.0–40.0)

## 2018-05-16 LAB — HEMOGLOBIN A1C: Hgb A1c MFr Bld: 6 % (ref 4.6–6.5)

## 2018-05-16 LAB — VITAMIN B12: Vitamin B-12: 953 pg/mL — ABNORMAL HIGH (ref 211–911)

## 2018-05-16 LAB — LDL CHOLESTEROL, DIRECT: Direct LDL: 91 mg/dL

## 2018-05-16 NOTE — Patient Instructions (Addendum)
To enroll in Medicare Part D prescription drug coverage, you must choose from private plans available in your service area. ... To enroll in a Part D plan, you can do any of the following: Call 1-800-MEDICARE. ... Use Medicare's Plan Finder tool to compare plans and enroll.    Continue doing brain stimulating activities (puzzles, reading, adult coloring books, staying active) to keep memory sharp.   Continue to eat heart healthy diet (full of fruits, vegetables, whole grains, lean protein, water--limit salt, fat, and sugar intake) and increase physical activity as tolerated.   [name] , Thank you for taking time to come for your Medicare Wellness Visit. I appreciate your ongoing commitment to your health goals. Please review the following plan we discussed and let me know if I can assist you in the future.   These are the goals we discussed: Goals    . Patient Stated     I want to lose weight by increasing my physical activity by doing chair exercises.        This is a list of the screening recommended for you and due dates:  Health Maintenance  Topic Date Due  . Colon Cancer Screening  08/01/2023  . Tetanus Vaccine  01/19/2026  . Flu Shot  Completed  .  Hepatitis C: One time screening is recommended by Center for Disease Control  (CDC) for  adults born from [birth year] through 1965.   Completed  . Pneumonia vaccines  Completed      Preventive Care 66 Years and Older, Male Preventive care refers to lifestyle choices and visits with your health care provider that can promote health and wellness. What does preventive care include?   A yearly physical exam. This is also called an annual well check.  Dental exams once or twice a year.  Routine eye exams. Ask your health care provider how often you should have your eyes checked.  Personal lifestyle choices, including: ? Daily care of your teeth and gums. ? Regular physical activity. ? Eating a healthy diet. ? Avoiding tobacco  and drug use. ? Limiting alcohol use. ? Practicing safe sex. ? Taking low doses of aspirin every day. ? Taking vitamin and mineral supplements as recommended by your health care provider. What happens during an annual well check? The services and screenings done by your health care provider during your annual well check will depend on your age, overall health, lifestyle risk factors, and family history of disease. Counseling Your health care provider may ask you questions about your:  Alcohol use.  Tobacco use.  Drug use.  Emotional well-being.  Home and relationship well-being.  Sexual activity.  Eating habits.  History of falls.  Memory and ability to understand (cognition).  Work and work Statistician. Screening You may have the following tests or measurements:  Height, weight, and BMI.  Blood pressure.  Lipid and cholesterol levels. These may be checked every 5 years, or more frequently if you are over 1 years old.  Skin check.  Lung cancer screening. You may have this screening every year starting at age 33 if you have a 30-pack-year history of smoking and currently smoke or have quit within the past 15 years.  Colorectal cancer screening. All adults should have this screening starting at age 73 and continuing until age 83. You will have tests every 1-10 years, depending on your results and the type of screening test. People at increased risk should start screening at an earlier age. Screening tests may include: ?  Guaiac-based fecal occult blood testing. ? Fecal immunochemical test (FIT). ? Stool DNA test. ? Virtual colonoscopy. ? Sigmoidoscopy. During this test, a flexible tube with a tiny camera (sigmoidoscope) is used to examine your rectum and lower colon. The sigmoidoscope is inserted through your anus into your rectum and lower colon. ? Colonoscopy. During this test, a long, thin, flexible tube with a tiny camera (colonoscope) is used to examine your entire  colon and rectum.  Prostate cancer screening. Recommendations will vary depending on your family history and other risks.  Hepatitis C blood test.  Hepatitis B blood test.  Sexually transmitted disease (STD) testing.  Diabetes screening. This is done by checking your blood sugar (glucose) after you have not eaten for a while (fasting). You may have this done every 1-3 years.  Abdominal aortic aneurysm (AAA) screening. You may need this if you are a current or former smoker.  Osteoporosis. You may be screened starting at age 17 if you are at high risk. Talk with your health care provider about your test results, treatment options, and if necessary, the need for more tests. Vaccines Your health care provider may recommend certain vaccines, such as:  Influenza vaccine. This is recommended every year.  Tetanus, diphtheria, and acellular pertussis (Tdap, Td) vaccine. You may need a Td booster every 10 years.  Varicella vaccine. You may need this if you have not been vaccinated.  Zoster vaccine. You may need this after age 52.  Measles, mumps, and rubella (MMR) vaccine. You may need at least one dose of MMR if you were born in 1957 or later. You may also need a second dose.  Pneumococcal 13-valent conjugate (PCV13) vaccine. One dose is recommended after age 45.  Pneumococcal polysaccharide (PPSV23) vaccine. One dose is recommended after age 57.  Meningococcal vaccine. You may need this if you have certain conditions.  Hepatitis A vaccine. You may need this if you have certain conditions or if you travel or work in places where you may be exposed to hepatitis A.  Hepatitis B vaccine. You may need this if you have certain conditions or if you travel or work in places where you may be exposed to hepatitis B.  Haemophilus influenzae type b (Hib) vaccine. You may need this if you have certain risk factors. Talk to your health care provider about which screenings and vaccines you need and  how often you need them. This information is not intended to replace advice given to you by your health care provider. Make sure you discuss any questions you have with your health care provider. Document Released: 04/16/2015 Document Revised: 05/10/2017 Document Reviewed: 01/19/2015 Elsevier Interactive Patient Education  2019 [employer].

## 2018-05-16 NOTE — Progress Notes (Signed)
Subjective:  Patient ID: [name], male    DOB: 1945/09/22  Age: 73 y.o. MRN: [id number]  CC: Hypertension   HPI [name] presents for f/up - He complains of weight gain, chronic generalized weakness, ataxia and shortness of breath.  Outpatient Medications Prior to Visit  Medication Sig Dispense Refill  . atorvastatin (LIPITOR) 80 MG tablet TAKE 1 TABLET BY MOUTH EVERY DAY 90 tablet 1  . Cyanocobalamin (VITAMIN B-12) 1000 MCG SUBL Place 5,000 mcg under the tongue every evening.     [name] Sodium (DSS) 100 MG CAPS Take 100 mg by mouth every other day.    [name] ELIQUIS 5 MG TABS tablet TAKE 1 TABLET BY MOUTH TWICE A DAY 180 tablet 1  . finasteride (PROSCAR) 5 MG tablet Take 1 tablet (5 mg total) by mouth daily. 30 tablet 1  . flecainide (TAMBOCOR) 50 MG tablet TAKE 1 TABLET BY MOUTH EVERY 12 HOURS 180 tablet 0  . loratadine (CLARITIN) 10 MG tablet Take 10 mg by mouth daily.    [name] LORazepam (ATIVAN) 0.5 MG tablet Take 1 tablet (0.5 mg total) by mouth at bedtime. 90 tablet 1  . montelukast (SINGULAIR) 10 MG tablet TAKE 1 TABLET BY MOUTH EVERYDAY AT BEDTIME 90 tablet 1  . pantoprazole (PROTONIX) 40 MG tablet Take 1 tablet (40 mg total) by mouth daily. 90 tablet 1  . potassium chloride SA (KLOR-CON M20) 20 MEQ tablet Take 2 tablets (40 mEq total) by mouth daily. 180 tablet 3  . rOPINIRole (REQUIP) 0.5 MG tablet 1 or 2 at bedtime for leg movement 180 tablet 3  . sertraline (ZOLOFT) 100 MG tablet Take 1.5 tablets (150 mg total) by mouth daily. 135 tablet 1  . torsemide (DEMADEX) 20 MG tablet TAKE 3 TABLETS (60 MG TOTAL) BY MOUTH DAILY. KEEP APPOINTMENT AS SCHEDULED. 270 tablet 3  . traMADol (ULTRAM) 50 MG tablet Take 1 tablet (50 mg total) by mouth 2 (two) times daily. (Patient taking differently: Take 50 mg by mouth as needed. ) 30 tablet 0  . CALCIUM PO Take 500 mg by mouth 2 (two) times daily.      No facility-administered medications prior to visit.     ROS Review of  Systems  Constitutional: Positive for fatigue and unexpected weight change (wt gain). Negative for appetite change and diaphoresis.  Respiratory: Positive for shortness of breath. Negative for chest tightness and wheezing.   Cardiovascular: Positive for leg swelling. Negative for chest pain and palpitations.  Gastrointestinal: Negative for abdominal pain, constipation, diarrhea and vomiting.  Genitourinary: Negative.  Negative for decreased urine volume and difficulty urinating.  Musculoskeletal: Positive for gait problem. Negative for myalgias and neck pain.  Skin: Negative for color change and pallor.  Neurological: Positive for weakness. Negative for dizziness, light-headedness and headaches.  Hematological: Negative for adenopathy. Does not bruise/bleed easily.  Psychiatric/Behavioral: Negative.     Objective:  BP 136/80 (BP Location: Left Arm, Patient Position: Sitting, Cuff Size: Large)   Pulse 86   Temp 98 F (36.7 C) (Oral)   Resp 16   Ht 6' (1.829 m)   Wt (!) 381 lb 12 oz (173.2 kg)   SpO2 97%   BMI 51.77 kg/m   BP Readings from Last 3 Encounters:  05/16/18 136/80  05/16/18 136/80  04/22/18 (!) 142/83    Wt Readings from Last 3 Encounters:  05/16/18 (!) 381 lb (172.8 kg)  05/16/18 (!) 381 lb 12 oz (173.2 kg)  04/22/18 (!) 374  lb 6.4 oz (169.8 kg)    Physical Exam Constitutional:      Appearance: He is obese. He is not ill-appearing or diaphoretic.  HENT:     Mouth/Throat:     Mouth: Mucous membranes are moist.     Pharynx: Oropharynx is clear. No oropharyngeal exudate or posterior oropharyngeal erythema.  Eyes:     General: No scleral icterus.    Conjunctiva/sclera: Conjunctivae normal.  Neck:     Musculoskeletal: Normal range of motion and neck supple. No neck rigidity or muscular tenderness.  Cardiovascular:     Rate and Rhythm: Normal rate and regular rhythm.     Pulses: Normal pulses.     Heart sounds: Normal heart sounds. No murmur. No gallop.     Pulmonary:     Effort: Pulmonary effort is normal. No respiratory distress.     Breath sounds: No stridor. No wheezing, rhonchi or rales.  Abdominal:     General: Bowel sounds are normal.     Palpations: There is no hepatomegaly, splenomegaly or mass.     Tenderness: There is no abdominal tenderness. There is no guarding.  Musculoskeletal:     Right lower leg: Edema (trace pitting) present.     Left lower leg: Edema (trace pitting) present.  Skin:    General: Skin is warm and dry.  Neurological:     Mental Status: He is oriented to person, place, and time. Mental status is at baseline.     Motor: Weakness present.     Coordination: Coordination abnormal.     Gait: Gait abnormal.  Psychiatric:        Mood and Affect: Mood normal.        Behavior: Behavior normal.        Thought Content: Thought content normal.        Judgment: Judgment normal.     Lab Results  Component Value Date   WBC 11.7 (H) 05/16/2018   HGB 14.2 05/16/2018   HCT 43.5 05/16/2018   PLT 232.0 05/16/2018   GLUCOSE 84 05/16/2018   CHOL 139 05/16/2018   TRIG 203.0 (H) 05/16/2018   HDL 29.70 (L) 05/16/2018   LDLDIRECT 91.0 05/16/2018   LDLCALC 77 06/19/2016   ALT 17 05/16/2018   AST 21 05/16/2018   NA 142 05/16/2018   K 3.7 05/16/2018   CL 100 05/16/2018   CREATININE 1.41 05/16/2018   BUN 22 05/16/2018   CO2 32 05/16/2018   TSH 2.04 05/16/2018   PSA 0.68 12/24/2015   INR 1.09 06/19/2016   HGBA1C 6.0 05/16/2018   MICROALBUR 1.1 12/12/2006    Ct Chest Wo Contrast  Result Date: 02/02/2017 CLINICAL DATA:  Cough with dyspnea. EXAM: CT CHEST WITHOUT CONTRAST TECHNIQUE: Multidetector CT imaging of the chest was performed following the standard protocol without IV contrast. COMPARISON:  06/13/2015 FINDINGS: Cardiovascular: The heart size is normal. No pericardial effusion. Coronary artery calcification is evident. Atherosclerotic calcification is noted in the wall of the thoracic aorta. Aberrant origin  right subclavian artery again noted. Mediastinum/Nodes: Stable 2.1 cm right thyroid nodule. No mediastinal lymphadenopathy. No evidence for gross hilar lymphadenopathy although assessment is limited by the lack of intravenous contrast on today's study. The esophagus has normal imaging features. There is no axillary lymphadenopathy. Lungs/Pleura: No focal airspace consolidation. No pulmonary edema or pleural effusion. No suspicious pulmonary nodule or mass. Upper Abdomen: Lap band noted in situ. No acute findings within the visualized upper abdomen. Musculoskeletal: Bone windows reveal no worrisome lytic or  sclerotic osseous lesions. IMPRESSION: Stable. No new or acute interval findings. No features to explain the patient's history of cough with dyspnea. Electronically Signed   By: Misty Stanley M.D.   On: 02/02/2017 17:01    Assessment & Plan:   Kabe was seen today for hypertension.  Diagnoses and all orders for this visit:  Benign essential HTN- His blood pressure is adequately well controlled.  Electrolytes and renal function are normal. -     CBC with Differential/Platelet; Future -     Comprehensive metabolic panel; Future -     TSH; Future  Prediabetes- His A1c is at 6.0%.  Medical therapy is not indicated.  He was encouraged to improve his lifestyle modifications. -     Comprehensive metabolic panel; Future -     Hemoglobin A1c; Future  Hyperlipidemia with target LDL less than 130- He has achieved his LDL goal and is doing well on the statin. -     Comprehensive metabolic panel; Future -     Lipid panel; Future -     TSH; Future  B12 deficiency- His vitamin B12 is a little high.  I have asked him to decrease his vitamin B12 dose by 50%. -     Vitamin B12; Future -     Folate; Future   I have discontinued Doren Custard A. Bhagat "[name]"'s CALCIUM PO. I am also having him maintain his Vitamin B-12, DSS, loratadine, finasteride, traMADol, potassium chloride SA, pantoprazole, ELIQUIS,  atorvastatin, montelukast, torsemide, flecainide, rOPINIRole, sertraline, and LORazepam.  No orders of the defined types were placed in this encounter.    Follow-up: Return in about 6 months (around 11/14/2018).  [name], MD

## 2018-05-16 NOTE — Patient Instructions (Signed)

## 2018-05-19 ENCOUNTER — Encounter: Payer: Self-pay | Admitting: Internal Medicine

## 2018-05-30 DIAGNOSIS — R1312 Dysphagia, oropharyngeal phase: Secondary | ICD-10-CM | POA: Diagnosis not present

## 2018-05-30 DIAGNOSIS — J3801 Paralysis of vocal cords and larynx, unilateral: Secondary | ICD-10-CM | POA: Diagnosis not present

## 2018-05-30 DIAGNOSIS — I69391 Dysphagia following cerebral infarction: Secondary | ICD-10-CM | POA: Diagnosis not present

## 2018-05-30 DIAGNOSIS — R490 Dysphonia: Secondary | ICD-10-CM | POA: Diagnosis not present

## 2018-06-19 ENCOUNTER — Telehealth: Payer: Self-pay | Admitting: Internal Medicine

## 2018-06-19 NOTE — Telephone Encounter (Signed)
Called and spoke with Patient. Patient had questions about taking ropinirole 0.5mg .  Patient stated he was told by Dr Annamaria Boots, if 1-2 tabs did not work with his leg jerks, it would be ok to take 3 tabs.  Patient stated that he is currently taking 3 tabs at night for his leg jerks.  Patient sated he spoke with his pharmacist, and was advised to call for a new ropinirole prescription for 3 tabs nightly, or insurance will not pay, because he will need refill before it is due.  Patient stated he does not need a refill at this time, but would like a new prescription to be sent to [facility] to be on hold until needed.  Dr Annamaria Boots, please advise  Allergies  Allergen Reactions  . Morphine Other (See Comments)    Nausea and Severe hallucinations  . Codeine Nausea Only  . Penicillins Other (See Comments)    LOC with shot  . Propoxyphene N-Acetaminophen Nausea Only   Current Outpatient Medications on File Prior to Visit  Medication Sig Dispense Refill  . atorvastatin (LIPITOR) 80 MG tablet TAKE 1 TABLET BY MOUTH EVERY DAY 90 tablet 1  . Cyanocobalamin (VITAMIN B-12) 1000 MCG SUBL Place 5,000 mcg under the tongue every evening.     [name] Sodium (DSS) 100 MG CAPS Take 100 mg by mouth every other day.    [name] ELIQUIS 5 MG TABS tablet TAKE 1 TABLET BY MOUTH TWICE A DAY 180 tablet 1  . finasteride (PROSCAR) 5 MG tablet Take 1 tablet (5 mg total) by mouth daily. 30 tablet 1  . flecainide (TAMBOCOR) 50 MG tablet TAKE 1 TABLET BY MOUTH EVERY 12 HOURS 180 tablet 0  . loratadine (CLARITIN) 10 MG tablet Take 10 mg by mouth daily.    [name] LORazepam (ATIVAN) 0.5 MG tablet Take 1 tablet (0.5 mg total) by mouth at bedtime. 90 tablet 1  . montelukast (SINGULAIR) 10 MG tablet TAKE 1 TABLET BY MOUTH EVERYDAY AT BEDTIME 90 tablet 1  . pantoprazole (PROTONIX) 40 MG tablet Take 1 tablet (40 mg total) by mouth daily. 90 tablet 1  . potassium chloride SA (KLOR-CON M20) 20 MEQ tablet Take 2 tablets (40 mEq total) by  mouth daily. 180 tablet 3  . rOPINIRole (REQUIP) 0.5 MG tablet 1 or 2 at bedtime for leg movement 180 tablet 3  . sertraline (ZOLOFT) 100 MG tablet Take 1.5 tablets (150 mg total) by mouth daily. 135 tablet 1  . torsemide (DEMADEX) 20 MG tablet TAKE 3 TABLETS (60 MG TOTAL) BY MOUTH DAILY. KEEP APPOINTMENT AS SCHEDULED. 270 tablet 3  . traMADol (ULTRAM) 50 MG tablet Take 1 tablet (50 mg total) by mouth 2 (two) times daily. (Patient taking differently: Take 50 mg by mouth as needed. ) 30 tablet 0   No current facility-administered medications on file prior to visit.

## 2018-06-20 MED ORDER — ROPINIROLE HCL 0.5 MG PO TABS: 0.5000 mg | ORAL_TABLET | Freq: Three times a day (TID) | ORAL | 3 refills | Status: DC

## 2018-06-20 NOTE — Telephone Encounter (Signed)
Ok to reorder as ropinerole 0.5 mg, # 90, 3 at bedtime, refill x 12

## 2018-06-20 NOTE — Telephone Encounter (Signed)
Called and spoke with patient advised that the refill has been sent. Nothing further needed.

## 2018-07-09 ENCOUNTER — Telehealth: Payer: Self-pay | Admitting: *Deleted

## 2018-07-09 MED ORDER — TRAMADOL HCL 50 MG PO TABS: 50.0000 mg | ORAL_TABLET | ORAL | 0 refills | Status: DC | PRN

## 2018-07-09 NOTE — Telephone Encounter (Signed)
30 tabs prescribed Will need visit  For f/u in 6wk

## 2018-07-09 NOTE — Telephone Encounter (Signed)
[name] called for a refill on his Tramadol.  His last visit was 01/24/18 return if symptoms worsen or fail to improve.  Last fill date was 10/01/17 #30 by PMP.  Please advise.

## 2018-07-10 NOTE — Telephone Encounter (Signed)
Please schedule follow up 6 weeks out

## 2018-07-15 ENCOUNTER — Other Ambulatory Visit: Payer: Self-pay | Admitting: Internal Medicine

## 2018-07-15 DIAGNOSIS — K2100 Gastro-esophageal reflux disease with esophagitis, without bleeding: Secondary | ICD-10-CM

## 2018-07-15 MED ORDER — PANTOPRAZOLE SODIUM 40 MG PO TBEC: 40.0000 mg | DELAYED_RELEASE_TABLET | Freq: Every day | ORAL | 1 refills | Status: DC

## 2018-08-06 ENCOUNTER — Telehealth: Payer: Self-pay | Admitting: Internal Medicine

## 2018-08-06 NOTE — Telephone Encounter (Signed)
Copied from [place] [phone]. Topic: Quick Communication - Rx Refill/Question >> Aug 06, 2018  2:55 PM [name], [name] wrote: Medication: flecainide (TAMBOCOR) 50 MG tablet  Has the patient contacted their pharmacy? yes   Preferred Pharmacy (with phone number or street name): [facility], [address] [phone] (Phone) [phone] (Fax)  Agent: Please be advised that RX refills may take up to 3 business days. We ask that you follow-up with your pharmacy.

## 2018-08-06 NOTE — Telephone Encounter (Signed)
Routing to dr jones, please advise, thanks 

## 2018-08-06 NOTE — Telephone Encounter (Signed)
Ask him to get this from his cardiologist  [place]

## 2018-08-07 ENCOUNTER — Other Ambulatory Visit: Payer: Self-pay | Admitting: Internal Medicine

## 2018-08-07 MED ORDER — FLECAINIDE ACETATE 50 MG PO TABS: 50.0000 mg | ORAL_TABLET | Freq: Two times a day (BID) | ORAL | 0 refills | Status: DC

## 2018-08-07 NOTE — Telephone Encounter (Signed)
° °  Patient is out of medication. Patient states Dr Ronnald Ramp - PCP requested Dr Debara Pickett follow and refill med.    *STAT* If patient is at the pharmacy, call can be transferred to refill team.   1. Which medications need to be refilled? (please list name of each medication and dose if known) flecainide (TAMBOCOR) 50 MG tablet  2. Which pharmacy/location (including street and city if local pharmacy) is medication to be sent to? [facility], [address]  3. Do they need a 30 day or 90 day supply? [name]

## 2018-08-07 NOTE — Telephone Encounter (Signed)
Patient advised to contact cardiologist for refill request

## 2018-08-13 ENCOUNTER — Telehealth: Payer: Self-pay | Admitting: Internal Medicine

## 2018-08-13 ENCOUNTER — Other Ambulatory Visit: Payer: Self-pay | Admitting: Internal Medicine

## 2018-08-13 MED ORDER — ROPINIROLE HCL 0.5 MG PO TABS: 0.5000 mg | ORAL_TABLET | Freq: Three times a day (TID) | ORAL | 3 refills | Status: DC

## 2018-08-13 NOTE — Progress Notes (Signed)
Ropinirole script changed to 90 day per insurance, and sent to [facility]

## 2018-08-13 NOTE — Telephone Encounter (Signed)
Called & spoke w/ pt to inform him we have refilled his Ropinirole 0.5 mg for 90 day supply to [facility] (see order placed by CY on 08/13/2018).  Pt verbalized understanding with no additional questions. Nothing further needed at this time.

## 2018-08-21 ENCOUNTER — Encounter: Payer: Self-pay | Admitting: Internal Medicine

## 2018-08-22 ENCOUNTER — Encounter: Payer: Self-pay | Admitting: Physical Medicine & Rehabilitation

## 2018-08-22 ENCOUNTER — Encounter: Payer: Medicare Other | Attending: Physical Medicine & Rehabilitation | Admitting: Physical Medicine & Rehabilitation

## 2018-08-22 ENCOUNTER — Other Ambulatory Visit: Payer: Self-pay

## 2018-08-22 DIAGNOSIS — M47817 Spondylosis without myelopathy or radiculopathy, lumbosacral region: Secondary | ICD-10-CM | POA: Diagnosis not present

## 2018-08-22 DIAGNOSIS — G463 Brain stem stroke syndrome: Secondary | ICD-10-CM | POA: Diagnosis not present

## 2018-08-22 NOTE — Progress Notes (Signed)
Subjective:    Patient ID: [name], male    DOB: January 06, 1946, 73 y.o.   MRN: [id number] Consent for phone visit HPI   Main complaint of weakness feeling  Low back and nerve pain in legs doing well  Had bilateral MBB L3-4-5 under fluoro, still "working" Standing from a low chair is difficult  Takes tramadol 1-2 times a week, 30 tablets lasts ~62mo Uses this not only for back pain but also if he has a headache since one of his MDs told him not to use Tylenol  Discussed tylenol "allergy"  , actually had nausea only with Darvocet Reviewed LFTs from Feb 2020, normal   Pain Inventory Average Pain 0 Pain Right Now 0 My pain is weakness mostly  In the last 24 hours, has pain interfered with the following? General activity 0 Relation with others 0 Enjoyment of life 0 What TIME of day is your pain at its worst? na Sleep (in general) Fair  Pain is worse with: na Pain improves with: na Relief from Meds: na  Mobility walk without assistance use a cane ability to climb steps?  yes do you drive?  yes  Function retired I need assistance with the following:  dressing, meal prep, household duties and shopping  Neuro/Psych weakness numbness tremor tingling trouble walking dizziness confusion  Prior Studies Any changes since last visit?  no  Physicians involved in your care Any changes since last visit?  no   Family History  Problem Relation Age of Onset  . Depression Mother   . Hypertension Mother   . Dementia Mother   . Heart disease Father        MI  . Depression Brother   . Stroke Paternal Aunt        CVA  . Diabetes Paternal Uncle   . Heart disease Paternal Uncle        MI  . Heart disease Paternal Grandmother        MI  . Diabetes Cousin        PATERNAL   Social History   Socioeconomic History  . Marital status: Married    Spouse name: Not on file  . Number of children: 1  . Years of education: Not on file  . Highest education level:  Not on file  Occupational History  . Occupation: Merchant [employer] officer  Social Needs  . Financial resource strain: Not very hard  . Food insecurity:    Worry: Never true    Inability: Never true  . Transportation needs:    Medical: No    Non-medical: No  Tobacco Use  . Smoking status: Former Smoker    Packs/day: 0.20    Years: 13.00    Pack years: 2.60    Types: Cigarettes    Start date: 04/03/1961    Last attempt to quit: 08/30/1974    Years since quitting: 44.0  . Smokeless tobacco: Never Used  Substance and Sexual Activity  . Alcohol use: No    Alcohol/week: 0.0 standard drinks  . Drug use: No  . Sexual activity: Not Currently  Lifestyle  . Physical activity:    Days per week: 0 days    Minutes per session: 0 min  . Stress: To some extent  Relationships  . Social connections:    Talks on phone: More than three times a week    Gets together: Once a week    Attends religious service: 1 to 4 times per year  Active member of club or organization: Yes    Attends meetings of clubs or organizations: 1 to 4 times per year    Relationship status: Married  Other Topics Concern  . Not on file  Social History Narrative   Lives at home w/ his wife   Right-handed   Caffeine: decaf coffee, occasional tea   Past Surgical History:  Procedure Laterality Date  . ARTHROSCOPY KNEE W/ DRILLING    . BREATH TEK H PYLORI N/A 12/23/2012   Procedure: BREATH TEK H PYLORI;  Surgeon: Gayland Curry, MD;  Location: [facility];  Service: General;  Laterality: N/A;  . CHOLECYSTECTOMY  1989  . COLONOSCOPY N/A 07/31/2013   Procedure: COLONOSCOPY;  Surgeon: Gatha Mayer, MD;  Location: [facility];  Service: Endoscopy;  Laterality: N/A;  . ESOPHAGOGASTRODUODENOSCOPY N/A 07/22/2013   Procedure: ESOPHAGOGASTRODUODENOSCOPY (EGD);  Surgeon: Irene Shipper, MD;  Location: [facility] ENDOSCOPY;  Service: Endoscopy;  Laterality: N/A;  . ESOPHAGOGASTRODUODENOSCOPY N/A 07/31/2013   Procedure:  ESOPHAGOGASTRODUODENOSCOPY (EGD);  Surgeon: Gatha Mayer, MD;  Location: [facility] ENDOSCOPY;  Service: Endoscopy;  Laterality: N/A;  . INTRAOPERATIVE TRANSESOPHAGEAL ECHOCARDIOGRAM N/A 07/18/2013   Procedure: INTRAOPERATIVE TRANSESOPHAGEAL ECHOCARDIOGRAM;  Surgeon: Grace Isaac, MD;  Location: [place];  Service: Open Heart Surgery;  Laterality: N/A;  . LAPAROSCOPIC GASTRIC BANDING WITH HIATAL HERNIA REPAIR N/A 04/08/2013   Procedure: LAPAROSCOPIC GASTRIC BANDING WITH possible  HIATAL HERNIA REPAIR;  Surgeon: Gayland Curry, MD;  Location: [facility];  Service: General;  Laterality: N/A;  . LITHOTRIPSY    . renal calculi    . SUBXYPHOID PERICARDIAL WINDOW N/A 07/18/2013   Procedure: SUBXYPHOID PERICARDIAL WINDOW;  Surgeon: Grace Isaac, MD;  Location: [place];  Service: Thoracic;  Laterality: N/A;  . TONSILLECTOMY    . total knee replacment     bilat  . trigger finger repair     also lue, cts surgically repaired  . WISDOM TOOTH EXTRACTION     Past Medical History:  Diagnosis Date  . Anemia 07/29/2013  . Asthma    childhood asthma  . Benign essential HTN   . Benign prostatic hypertrophy    several prostate biopsies neg for cancer.   . Bilateral hip pain   . Cancer ([place])   . CHF (congestive heart failure) ([place])   . Chronic anticoagulation 07/22/2013  . Complication of anesthesia    MORPHINE CAUSES HALLUCINATIONS  . CVA (cerebral vascular accident) ([place]) 06/2016  . Depression   . Diastolic congestive heart failure ([place]) 07/16/2013  . Dysphagia, post-stroke   . Embolic cerebral infarction ([place]) 06/21/2016  . FH: colonic polyps 2010  . Gout   . History of kidney stones   . Hypertension   . Kidney cysts   . Morbid obesity ([place])   . Numbness    feet / legs  . PAF (paroxysmal atrial fibrillation) with RVR 07/29/2013  . Recurrent pulmonary emboli ([place]) 06/13/2015  . Sleep apnea    USES C PAP  . Stroke syndrome   . 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   Pulse 69   Ht 6' (1.829 m)   Wt (!) 370 lb  (167.8 kg)   SpO2 95%   BMI 50.18 kg/m   Opioid Risk Score:   Fall Risk Score:  `1  Depression screen PHQ 2/9  Depression screen [facility] 2/9 05/16/2018 10/01/2017 07/28/2016  Decreased Interest 1 0 0  Down, Depressed, Hopeless 1 0 0  PHQ - 2 Score 2 0 0  Altered sleeping 1 -  0  Tired, decreased energy 2 - 1  Change in appetite 0 - 0  Feeling bad or failure about yourself  1 - 0  Trouble concentrating 0 - 1  Moving slowly or fidgety/restless 0 - 0  Suicidal thoughts 0 - -  PHQ-9 Score 6 - 2  Difficult doing work/chores Not difficult at all - Not difficult at all  Some recent data might be hidden    Review of Systems  Constitutional: Negative.   HENT: Negative.   Eyes: Negative.   Respiratory: Negative.   Cardiovascular: Negative.   Gastrointestinal: Negative.   Endocrine: Negative.   Genitourinary: Negative.   Musculoskeletal: Positive for gait problem.  Skin: Negative.   Allergic/Immunologic: Negative.   Neurological: Positive for dizziness, tremors, weakness and numbness.  Hematological: Negative.   Psychiatric/Behavioral: Positive for confusion.  All other systems reviewed and are negative.      Objective:   Physical Exam Nursing note reviewed.  Constitutional:      Appearance: He is obese.  Neurological:     Mental Status: He is alert and oriented to person, place, and time. Mental status is at baseline.     Cranial Nerves: No dysarthria.     Comments: No evidence of aphasia  Psychiatric:        Mood and Affect: Mood normal.        Behavior: Behavior normal.        Thought Content: Thought content normal.   Remainder of exam is deferred secondary to phone visit        Assessment & Plan:  1.  Chronic low back pain , lumbar spondylosis without myelopathy, he has had prolonged results with lumbar medial branch blocks performed approximately 6 months ago.  As discussed with the patient if he has an exacerbation of his pain would recommend repeat bilateral  L3-L4 medial branch and L5 dorsal ramus injection under fluoroscopic guidance. He still takes tramadol on an infrequent basis.  Sometimes he takes tramadol for other pain such as headaches  Trial of acetaminophen, we discussed that he could use this for mild to moderate pain such as headache pain and leave the tramadol for pain that does not respond to Tylenol  If he no longer is taking tramadol, the patient will not need to see me on a every 47-month basis but if he continues to require this I will see him back in 6 months.  2.  Chronic deconditioning he is morbidly obese has arthritis as well as history of Wallenberg syndrome.  We discussed sit to stand exercises in front of a counter rising up from a chair to standing 3 sets of 10 repetitions daily We also discussed getting on exercise bike 10 minutes 3 times per day  3.  Morbid obesity the patient is trying to dress empty calories and is dropped diet.  We also discussed the exercises above.  He will follow-up with his primary care physician. Duration of call 21 Minutes

## 2018-08-28 ENCOUNTER — Telehealth: Payer: Self-pay | Admitting: Internal Medicine

## 2018-08-28 NOTE — Telephone Encounter (Signed)
Returned call to patient of Dr. Debara Pickett. He reports this medication is expensive for him. He is requesting samples of eliquis 5mg   Advised him we can check on this and notify him if any are available

## 2018-08-28 NOTE — Telephone Encounter (Signed)
  Pt c/o medication issue:  1. Name of Medication: ELIQUIS 5 MG TABS tablet  2. How are you currently taking this medication (dosage and times per day)? As directed  3. Are you having a reaction (difficulty breathing--STAT)?  Na  4. What is your medication issue? Patient would like to ask some questions about this medication and would like to speak to the nurse

## 2018-08-28 NOTE — Telephone Encounter (Signed)
Triage nurse onsite checked samples but none are available. Patient notified

## 2018-09-12 ENCOUNTER — Encounter: Payer: Self-pay | Admitting: Physical Medicine & Rehabilitation

## 2018-09-12 ENCOUNTER — Other Ambulatory Visit: Payer: Self-pay

## 2018-09-12 ENCOUNTER — Encounter: Payer: Medicare Other | Attending: Physical Medicine & Rehabilitation | Admitting: Physical Medicine & Rehabilitation

## 2018-09-12 VITALS — BP 123/77 | HR 78 | Temp 99.2°F | Ht 72.0 in | Wt 375.0 lb

## 2018-09-12 DIAGNOSIS — R0681 Apnea, not elsewhere classified: Secondary | ICD-10-CM | POA: Diagnosis not present

## 2018-09-12 DIAGNOSIS — R202 Paresthesia of skin: Secondary | ICD-10-CM | POA: Diagnosis not present

## 2018-09-12 DIAGNOSIS — M545 Low back pain: Secondary | ICD-10-CM | POA: Diagnosis not present

## 2018-09-12 DIAGNOSIS — R0602 Shortness of breath: Secondary | ICD-10-CM | POA: Diagnosis not present

## 2018-09-12 DIAGNOSIS — R200 Anesthesia of skin: Secondary | ICD-10-CM | POA: Diagnosis not present

## 2018-09-12 DIAGNOSIS — M25552 Pain in left hip: Secondary | ICD-10-CM | POA: Insufficient documentation

## 2018-09-12 DIAGNOSIS — M533 Sacrococcygeal disorders, not elsewhere classified: Secondary | ICD-10-CM | POA: Diagnosis not present

## 2018-09-12 DIAGNOSIS — Z8673 Personal history of transient ischemic attack (TIA), and cerebral infarction without residual deficits: Secondary | ICD-10-CM | POA: Diagnosis not present

## 2018-09-12 DIAGNOSIS — G8929 Other chronic pain: Secondary | ICD-10-CM | POA: Insufficient documentation

## 2018-09-12 DIAGNOSIS — M7989 Other specified soft tissue disorders: Secondary | ICD-10-CM | POA: Insufficient documentation

## 2018-09-12 DIAGNOSIS — M47817 Spondylosis without myelopathy or radiculopathy, lumbosacral region: Secondary | ICD-10-CM

## 2018-09-12 DIAGNOSIS — M2550 Pain in unspecified joint: Secondary | ICD-10-CM | POA: Diagnosis not present

## 2018-09-12 DIAGNOSIS — R062 Wheezing: Secondary | ICD-10-CM | POA: Insufficient documentation

## 2018-09-12 DIAGNOSIS — R42 Dizziness and giddiness: Secondary | ICD-10-CM | POA: Insufficient documentation

## 2018-09-12 DIAGNOSIS — I110 Hypertensive heart disease with heart failure: Secondary | ICD-10-CM | POA: Insufficient documentation

## 2018-09-12 DIAGNOSIS — G463 Brain stem stroke syndrome: Secondary | ICD-10-CM | POA: Diagnosis not present

## 2018-09-12 DIAGNOSIS — Z87891 Personal history of nicotine dependence: Secondary | ICD-10-CM | POA: Insufficient documentation

## 2018-09-12 DIAGNOSIS — R531 Weakness: Secondary | ICD-10-CM | POA: Diagnosis not present

## 2018-09-12 DIAGNOSIS — I5032 Chronic diastolic (congestive) heart failure: Secondary | ICD-10-CM | POA: Insufficient documentation

## 2018-09-12 DIAGNOSIS — M25551 Pain in right hip: Secondary | ICD-10-CM | POA: Insufficient documentation

## 2018-09-12 DIAGNOSIS — R262 Difficulty in walking, not elsewhere classified: Secondary | ICD-10-CM | POA: Insufficient documentation

## 2018-09-12 DIAGNOSIS — Z8249 Family history of ischemic heart disease and other diseases of the circulatory system: Secondary | ICD-10-CM | POA: Insufficient documentation

## 2018-09-12 NOTE — Patient Instructions (Addendum)
Left sided Sacroiliac vs Lumbar facet joint pain Sacroiliac (SI) Joint Injection Patient Information  Description: The sacroiliac joint connects the scrum (very low back and tailbone) to the ilium (a pelvic bone which also forms half of the hip joint).  Normally this joint experiences very little motion.  When this joint becomes inflamed or unstable low back and or hip and pelvis pain may result.  Injection of this joint with local anesthetics (numbing medicines) and steroids can provide diagnostic information and reduce pain.  This injection is performed with the aid of x-ray guidance into the tailbone area while you are lying on your stomach.   You may experience an electrical sensation down the leg while this is being done.  You may also experience numbness.  We also may ask if we are reproducing your normal pain during the injection.  Conditions which may be treated SI injection:   Low back, buttock, hip or leg pain  Preparation for the Injection:  1. Do not eat any solid food or dairy products within 8 hours of your appointment.  2. You may drink clear liquids up to 3 hours before appointment.  Clear liquids include water, black coffee, juice or soda.  No milk or cream please. 3. You may take your regular medications, including pain medications with a sip of water before your appointment.  Diabetics should hold regular insulin (if take separately) and take 1/2 normal NPH dose the morning of the procedure.  Carry some sugar containing items with you to your appointment. 4. A driver must accompany you and be prepared to drive you home after your procedure. 5. Bring all of your current medications with you. 6. An IV may be inserted and sedation may be given at the discretion of the physician. 7. A blood pressure cuff, EKG and other monitors will often be applied during the procedure.  Some patients may need to have extra oxygen administered for a short period.  8. You will be asked to provide  medical information, including your allergies, prior to the procedure.  We must know immediately if you are taking blood thinners (like Coumadin/Warfarin) or if you are allergic to IV iodine contrast (dye).  We must know if you could possible be pregnant.  Possible side effects:   Bleeding from needle site  Infection (rare, may require surgery)  Nerve injury (rare)  Numbness & tingling (temporary)  A brief convulsion or seizure  Light-headedness (temporary)  Pain at injection site (several days)  Decreased blood pressure (temporary)  Weakness in the leg (temporary)   Call if you experience:   New onset weakness or numbness of an extremity below the injection site that last more than 8 hours.  Hives or difficulty breathing ( go to the emergency room)  Inflammation or drainage at the injection site  Any new symptoms which are concerning to you  Please note:  Although the local anesthetic injected can often make your back/ hip/ buttock/ leg feel good for several hours after the injections, the pain will likely return.  It takes 3-7 days for steroids to work in the sacroiliac area.  You may not notice any pain relief for at least that one week.  If effective, we will often do a series of three injections spaced 3-6 weeks apart to maximally decrease your pain.  After the initial series, we generally will wait some months before a repeat injection of the same type.  If you have any questions, please call [phone] [place]  [facility]

## 2018-09-12 NOTE — Progress Notes (Signed)
Subjective:    Patient ID: [name], male    DOB: 26-Feb-1946, 73 y.o.   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  He has had a fall in 2018. He has had good relief in the past with right sacroiliac injection under fluoroscopic guidance performed on 04/19/2017 and 07/01/2017 as well as 10/01/2017 Patient had improvement in right-sided buttock pain and was complaining of more centralized lumbar pain and underwent bilateral L4 medial branch blocks and L5 dorsal ramus injection under fluoroscopic guidance on 11/08/2017, 50% relief Repeat L3-L4 medial branch and L5 dorsal ramus injection on 02/21/2018  Low back is hurting more.  Started 1-2 wks ago Pain started on the left side   Pain Inventory Average Pain 9 Pain Right Now 9 My pain is sharp, stabbing and tingling  In the last 24 hours, has pain interfered with the following? General activity 9 Relation with others 9 Enjoyment of life 8 What TIME of day is your pain at its worst? night Sleep (in general) Fair  Pain is worse with: walking and bending Pain improves with: medication and injections Relief from Meds: 8  Mobility walk with assistance use a walker ability to climb steps?  yes do you drive?  yes  Function retired  Neuro/Psych weakness numbness tingling trouble walking dizziness  Prior Studies Any changes since last visit?  no  Physicians involved in your care Any changes since last visit?  no   Family History  Problem Relation Age of Onset  . Depression Mother   . Hypertension Mother   . Dementia Mother   . Heart disease Father        MI  . Depression Brother   . Stroke Paternal Aunt        CVA  . Diabetes Paternal Uncle   . Heart disease Paternal Uncle        MI  . Heart disease  Paternal Grandmother        MI  . Diabetes Cousin        PATERNAL   Social History   Socioeconomic History  . Marital status: Married    Spouse name: Not on file  . Number of children: 1  . Years of education: Not on file  . Highest education level: Not on file  Occupational History  . Occupation: Merchant [employer] officer  Social Needs  . Financial resource strain: Not very hard  . Food insecurity    Worry: Never true    Inability: Never true  . Transportation needs    Medical: No    Non-medical: No  Tobacco Use  . Smoking status: Former Smoker    Packs/day: 0.20    Years: 13.00    Pack years: 2.60    Types: Cigarettes    Start date: 04/03/1961    Quit date: 08/30/1974    Years since quitting: 44.0  . Smokeless tobacco: Never Used  Substance and Sexual Activity  . Alcohol use: No    Alcohol/week: 0.0 standard drinks  . Drug use: No  . Sexual activity: Not Currently  Lifestyle  . Physical activity    Days per week: 0 days    Minutes per session: 0 min  . Stress: To some extent  Relationships  . Social connections    Talks  on phone: More than three times a week    Gets together: Once a week    Attends religious service: 1 to 4 times per year    Active member of club or organization: Yes    Attends meetings of clubs or organizations: 1 to 4 times per year    Relationship status: Married  Other Topics Concern  . Not on file  Social History Narrative   Lives at home w/ his wife   Right-handed   Caffeine: decaf coffee, occasional tea   Past Surgical History:  Procedure Laterality Date  . ARTHROSCOPY KNEE W/ DRILLING    . BREATH TEK H PYLORI N/A 12/23/2012   Procedure: BREATH TEK H PYLORI;  Surgeon: Gayland Curry, MD;  Location: [facility];  Service: General;  Laterality: N/A;  . CHOLECYSTECTOMY  1989  . COLONOSCOPY N/A 07/31/2013   Procedure: COLONOSCOPY;  Surgeon: Gatha Mayer, MD;  Location: [place];  Service: Endoscopy;  Laterality: N/A;  .  ESOPHAGOGASTRODUODENOSCOPY N/A 07/22/2013   Procedure: ESOPHAGOGASTRODUODENOSCOPY (EGD);  Surgeon: Irene Shipper, MD;  Location: [facility] ENDOSCOPY;  Service: Endoscopy;  Laterality: N/A;  . ESOPHAGOGASTRODUODENOSCOPY N/A 07/31/2013   Procedure: ESOPHAGOGASTRODUODENOSCOPY (EGD);  Surgeon: Gatha Mayer, MD;  Location: [facility] ENDOSCOPY;  Service: Endoscopy;  Laterality: N/A;  . INTRAOPERATIVE TRANSESOPHAGEAL ECHOCARDIOGRAM N/A 07/18/2013   Procedure: INTRAOPERATIVE TRANSESOPHAGEAL ECHOCARDIOGRAM;  Surgeon: Grace Isaac, MD;  Location: [place];  Service: Open Heart Surgery;  Laterality: N/A;  . LAPAROSCOPIC GASTRIC BANDING WITH HIATAL HERNIA REPAIR N/A 04/08/2013   Procedure: LAPAROSCOPIC GASTRIC BANDING WITH possible  HIATAL HERNIA REPAIR;  Surgeon: Gayland Curry, MD;  Location: [facility];  Service: General;  Laterality: N/A;  . LITHOTRIPSY    . renal calculi    . SUBXYPHOID PERICARDIAL WINDOW N/A 07/18/2013   Procedure: SUBXYPHOID PERICARDIAL WINDOW;  Surgeon: Grace Isaac, MD;  Location: [place];  Service: Thoracic;  Laterality: N/A;  . TONSILLECTOMY    . total knee replacment     bilat  . trigger finger repair     also lue, cts surgically repaired  . WISDOM TOOTH EXTRACTION     Past Medical History:  Diagnosis Date  . Anemia 07/29/2013  . Asthma    childhood asthma  . Benign essential HTN   . Benign prostatic hypertrophy    several prostate biopsies neg for cancer.   . Bilateral hip pain   . Cancer ([place])   . CHF (congestive heart failure) ([place])   . Chronic anticoagulation 07/22/2013  . Complication of anesthesia    MORPHINE CAUSES HALLUCINATIONS  . CVA (cerebral vascular accident) ([place]) 06/2016  . Depression   . Diastolic congestive heart failure ([place]) 07/16/2013  . Dysphagia, post-stroke   . Embolic cerebral infarction ([place]) 06/21/2016  . FH: colonic polyps 2010  . Gout   . History of kidney stones   . Hypertension   . Kidney cysts   . Morbid obesity ([place])   . Numbness    feet / legs   . PAF (paroxysmal atrial fibrillation) with RVR 07/29/2013  . Recurrent pulmonary emboli ([place]) 06/13/2015  . Sleep apnea    USES C PAP  . Stroke syndrome   . 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   BP 123/77   Pulse 78   Temp 99.2 F (37.3 C)   Ht 6' (1.829 m)   Wt (!) 375 lb (170.1 kg)   SpO2 97%   BMI 50.86 kg/m   Opioid Risk Score:   Fall Risk  Score:  `1  Depression screen PHQ 2/9  Depression screen [facility] 2/9 05/16/2018 10/01/2017 07/28/2016  Decreased Interest 1 0 0  Down, Depressed, Hopeless 1 0 0  PHQ - 2 Score 2 0 0  Altered sleeping 1 - 0  Tired, decreased energy 2 - 1  Change in appetite 0 - 0  Feeling bad or failure about yourself  1 - 0  Trouble concentrating 0 - 1  Moving slowly or fidgety/restless 0 - 0  Suicidal thoughts 0 - -  PHQ-9 Score 6 - 2  Difficult doing work/chores Not difficult at all - Not difficult at all  Some recent data might be hidden     Review of Systems  Constitutional: Positive for unexpected weight change.  HENT: Negative.   Eyes: Negative.   Respiratory: Positive for apnea, shortness of breath and wheezing.   Cardiovascular: Positive for leg swelling.  Gastrointestinal: Negative.   Endocrine: Negative.   Genitourinary: Negative.   Musculoskeletal: Positive for arthralgias, back pain, gait problem, joint swelling and myalgias.  Skin: Negative.   Allergic/Immunologic: Negative.   Neurological: Positive for dizziness, weakness and numbness.  Hematological: Negative.   Psychiatric/Behavioral: Negative.   All other systems reviewed and are negative.      Objective:   Physical Exam Vitals signs and nursing note reviewed.  Constitutional:      Appearance: Normal appearance.  HENT:     Head: Normocephalic and atraumatic.     Mouth/Throat:     Mouth: Mucous membranes are moist.  Eyes:     Extraocular Movements: Extraocular movements intact.     Pupils: Pupils are equal, round, and reactive to light.  Musculoskeletal:     Lumbar back:  He exhibits decreased range of motion and tenderness.     Comments: Tenderness at the left PSIS area no tenderness along the lumbar paraspinal areas no tenderness around the right PSIS area.  Straight leg raise Ambulates with walker no evidence of toe drag  Neurological:     Mental Status: He is alert and oriented to person, place, and time.           Assessment & Plan:  1.  Axial back pain primarily left buttock region, given his prior history of right sacroiliac pain this likely represents left sacroiliac pain.  There is no radicular component. We will schedule for left sacroiliac injection under fluoroscopic guidance. Discussed pain medications he feels like he does okay on tramadol on an occasional basis he is very worried about getting addicted to medications

## 2018-09-26 ENCOUNTER — Telehealth: Payer: Self-pay | Admitting: Internal Medicine

## 2018-09-26 NOTE — Telephone Encounter (Signed)
Spoke with pt who requests samples of Eliquis 5 mg. No samples available in office. Informed pt that patient assistance form for Eliquis available to complete to see if pt qualifies. Pt aware that form will be available for pick up at front desk and he should complete his portion and return it to the office. Included post-it note stating that patient should return form to [facility] office as well. Pt verbalized understanding.

## 2018-09-26 NOTE — Telephone Encounter (Signed)
New message   Patient calling the office for samples of medication:   1.  What medication and dosage are you requesting samples for?  ELIQUIS 5 MG TABS tablet     2.  Are you currently out of this medication? No    

## 2018-09-27 ENCOUNTER — Encounter (HOSPITAL_BASED_OUTPATIENT_CLINIC_OR_DEPARTMENT_OTHER): Payer: Medicare Other | Admitting: Physical Medicine & Rehabilitation

## 2018-09-27 ENCOUNTER — Encounter: Payer: Self-pay | Admitting: Physical Medicine & Rehabilitation

## 2018-09-27 ENCOUNTER — Other Ambulatory Visit: Payer: Self-pay

## 2018-09-27 VITALS — BP 103/51 | HR 70 | Temp 97.7°F | Ht 72.0 in | Wt 386.0 lb

## 2018-09-27 DIAGNOSIS — M259 Joint disorder, unspecified: Secondary | ICD-10-CM | POA: Diagnosis not present

## 2018-09-27 DIAGNOSIS — M25551 Pain in right hip: Secondary | ICD-10-CM | POA: Diagnosis not present

## 2018-09-27 DIAGNOSIS — M545 Low back pain: Secondary | ICD-10-CM | POA: Diagnosis not present

## 2018-09-27 DIAGNOSIS — M7989 Other specified soft tissue disorders: Secondary | ICD-10-CM | POA: Diagnosis not present

## 2018-09-27 DIAGNOSIS — M25552 Pain in left hip: Secondary | ICD-10-CM | POA: Diagnosis not present

## 2018-09-27 DIAGNOSIS — M533 Sacrococcygeal disorders, not elsewhere classified: Secondary | ICD-10-CM | POA: Diagnosis not present

## 2018-09-27 DIAGNOSIS — M2550 Pain in unspecified joint: Secondary | ICD-10-CM | POA: Diagnosis not present

## 2018-09-27 NOTE — Progress Notes (Signed)
  PROCEDURE RECORD [facility]   Name: ILARIO DHALIWAL DOB:1946/01/08 MRN: [id number]  Date:09/27/2018  Physician: Alysia Penna, MD    Nurse/CMA: [name], CMA  Allergies:  Allergies  Allergen Reactions  . Morphine Other (See Comments)    Nausea and Severe hallucinations  . Codeine Nausea Only  . Penicillins Other (See Comments)    LOC with shot  . Propoxyphene N-Acetaminophen Nausea Only    Consent Signed: Yes.    Is patient diabetic? No.  CBG today?   Pregnant: No. LMP: No LMP for male patient. (age 73-55)  Anticoagulants: yes (Eliquis) Anti-inflammatory: no Antibiotics: no  Procedure: Left Sacroiliac Joint Injection Position: Prone Start Time: 1:38pn End Time:1:42pm  Fluoro Time: 13  RN/CMA [name], CMA Casen Pryor, CMA    Time 1:28pm 1:47pm    BP 103/51 145/79    Pulse 70 70    Respirations 16 16    O2 Sat 97 95    S/S 6/6 6/6    Pain Level 7/10 7/10     D/C home with wife , patient A & O X 3, D/C instructions reviewed, and sits independently.

## 2018-09-27 NOTE — Progress Notes (Signed)
Left sacroiliac injection under fluoroscopic guidance  Indication: Left Low back and buttocks pain not relieved by medication management and other conservative care.  Informed consent was obtained after describing risks and benefits of the procedure with the patient, this includes bleeding, bruising, infection, paralysis and medication side effects. The patient wishes to proceed and has given written consent. The patient was placed in a prone position. The lumbar and sacral area was marked and prepped with Betadine. A 25-gauge 1-1/2 inch needle was inserted into the skin and subcutaneous tissue and 1 mL of 1% lidocaine was injected. Then a 25-gauge 3 inch spinal needle was inserted under fluoroscopic guidance into the left sacroiliac joint. AP and lateral images were utilized. Isovue 200x0.5 mL under live fluoroscopy demonstrated no intravascular uptake. Then a solution containing one ML of 6 mg per mLbetamethasone and 2 ML of 2% lidocaine MPF was injected x1.5 mL. Patient tolerated the procedure well. Post procedure instructions were given. Please see post procedure form. 

## 2018-09-27 NOTE — Patient Instructions (Signed)
Sacroiliac injection was performed today. A combination of a naming medicine plus a cortisone medicine was injected. The injection was done under x-ray guidance. This procedure has been performed to help reduce low back and buttocks pain as well as potentially hip pain. The duration of this injection is variable lasting from hours to  Months. It may repeated if needed. 

## 2018-10-02 ENCOUNTER — Telehealth: Payer: Self-pay | Admitting: Physician Assistant

## 2018-10-02 NOTE — Telephone Encounter (Signed)
° ° °1. 3. Confirm consent - "In the setting of the current Covid19 crisis, you are scheduled for a (phone or video) visit with your provider on (date) at (time).  Just as we do with many in-office visits, in order for you to participate in this visit, we must obtain consent.  If you'd like, I can send this to your mychart (if signed up) or email for you to review.  Otherwise, I can obtain your verbal consent now.  All virtual visits are billed to your insurance company just like a normal visit would be.  By agreeing to a virtual visit, we'd like you to understand that the technology does not allow for your provider to perform an examination, and thus may limit your provider's ability to fully assess your condition. If your provider identifies any concerns that need to be evaluated in person, we will make arrangements to do so.  Finally, though the technology is pretty good, we cannot assure that it will always work on either your or our end, and in the setting of a video visit, we may have to convert it to a phone-only visit.  In either situation, we cannot ensure that we have a secure connection.  Are you willing to proceed?" STAFF: Did the patient verbally acknowledge consent to telehealth visit? Document YES/NO here:  Yes ° ° ° ° ° ° °FULL LENGTH CONSENT FOR TELE-HEALTH VISIT  ° °I hereby voluntarily request, consent and authorize [facility] and its employed or contracted physicians, physician assistants, nurse practitioners or other licensed health care professionals (the Practitioner), to provide me with telemedicine health care services (the “Services") as deemed necessary by the treating Practitioner. I acknowledge and consent to receive the Services by the Practitioner via telemedicine. I understand that the telemedicine visit will involve communicating with the Practitioner through live audiovisual communication technology and the disclosure of certain medical information by electronic transmission. I  acknowledge that I have been given the opportunity to request an in-person assessment or other available alternative prior to the telemedicine visit and am voluntarily participating in the telemedicine visit. ° °I understand that I have the right to withhold or withdraw my consent to the use of telemedicine in the course of my care at any time, without affecting my right to future care or treatment, and that the Practitioner or I may terminate the telemedicine visit at any time. I understand that I have the right to inspect all information obtained and/or recorded in the course of the telemedicine visit and may receive copies of available information for a reasonable fee.  I understand that some of the potential risks of receiving the Services via telemedicine include:  °• Delay or interruption in medical evaluation due to technological equipment failure or disruption; °• Information transmitted may not be sufficient (e.g. poor resolution of images) to allow for appropriate medical decision making by the Practitioner; and/or  °• In rare instances, security protocols could fail, causing a breach of personal health information. ° °Furthermore, I acknowledge that it is my responsibility to provide information about my medical history, conditions and care that is complete and accurate to the best of my ability. I acknowledge that Practitioner's advice, recommendations, and/or decision may be based on factors not within their control, such as incomplete or inaccurate data provided by me or distortions of diagnostic images or specimens that may result from electronic transmissions. I understand that the practice of medicine is not an exact science and that Practitioner makes no warranties   or guarantees regarding treatment outcomes. I acknowledge that I will receive a copy of this consent concurrently upon execution via email to the email address I last provided but may also request a printed copy by calling the office of  [facility].   ° °I understand that my insurance will be billed for this visit.  ° °I have read or had this consent read to me. °• I understand the contents of this consent, which adequately explains the benefits and risks of the Services being provided via telemedicine.  °• I have been provided ample opportunity to ask questions regarding this consent and the Services and have had my questions answered to my satisfaction. °• I give my informed consent for the services to be provided through the use of telemedicine in my medical care ° °By participating in this telemedicine visit I agree to the above. ° °

## 2018-10-03 ENCOUNTER — Encounter: Payer: Self-pay | Admitting: Physician Assistant

## 2018-10-03 ENCOUNTER — Telehealth (INDEPENDENT_AMBULATORY_CARE_PROVIDER_SITE_OTHER): Payer: Medicare Other | Admitting: Physician Assistant

## 2018-10-03 VITALS — HR 64 | Ht 72.0 in | Wt 382.0 lb

## 2018-10-03 DIAGNOSIS — I5032 Chronic diastolic (congestive) heart failure: Secondary | ICD-10-CM | POA: Diagnosis not present

## 2018-10-03 DIAGNOSIS — I480 Paroxysmal atrial fibrillation: Secondary | ICD-10-CM | POA: Diagnosis not present

## 2018-10-03 DIAGNOSIS — I110 Hypertensive heart disease with heart failure: Secondary | ICD-10-CM

## 2018-10-03 DIAGNOSIS — G463 Brain stem stroke syndrome: Secondary | ICD-10-CM

## 2018-10-03 DIAGNOSIS — I10 Essential (primary) hypertension: Secondary | ICD-10-CM

## 2018-10-03 NOTE — Patient Instructions (Signed)
Medication Instructions:  Your physician recommends that you continue on your current medications as directed. Please refer to the Current Medication list given to you today.  If you need a refill on your cardiac medications before your next appointment, please call your pharmacy.    Testing/Procedures: Your physician has requested that you have an echocardiogram. Echocardiography is a painless test that uses sound waves to create images of your heart. It provides your doctor with information about the size and shape of your heart and how well your heart's chambers and valves are working. This procedure takes approximately one hour. There are no restrictions for this procedure.   Someone will contact you to schedule this. It will be done at [place]: At [facility], you and your health needs are our priority.  As part of our continuing mission to provide you with exceptional heart care, we have created designated Provider Care Teams.  These Care Teams include your primary Cardiologist (physician) and Advanced Practice Providers (APPs -  Physician Assistants and Nurse Practitioners) who all work together to provide you with the care you need, when you need it.  You will need a follow up appointment in 3 months.  Please call our office in advance to schedule this appointment.  You may see Pixie Casino, MD or one of the following Advanced Practice Providers on your designated Care Team: [place], [place] . Fabian Sharp, PA-C  Any Other Special Instructions Will Be Listed Below (If Applicable). We are sending your Patient Assistance forms and 30 Day Meal Plan to you through the mail.

## 2018-10-03 NOTE — Progress Notes (Signed)
Virtual Visit via Telephone Note   This visit type was conducted due to national recommendations for restrictions regarding the COVID-19 Pandemic (e.g. social distancing) in an effort to limit this patient's exposure and mitigate transmission in our community.  Due to his co-morbid illnesses, this patient is at least at moderate risk for complications without adequate follow up.  This format is felt to be most appropriate for this patient at this time.  The patient did not have access to video technology/had technical difficulties with video requiring transitioning to audio format only (telephone).  All issues noted in this document were discussed and addressed.  No physical exam could be performed with this format.  Please refer to the patient's chart for his  consent to telehealth for [facility].  Evaluation Performed:  Follow-up visit  This visit type was conducted due to national recommendations for restrictions regarding the COVID-19 Pandemic (e.g. social distancing).  This format is felt to be most appropriate for this patient at this time.  All issues noted in this document were discussed and addressed.  No physical exam was performed (except for noted visual exam findings with Video Visits).  Please refer to the patient's chart (MyChart message for video visits and phone note for telephone visits) for the patient's consent to telehealth for [facility].  Date:  10/03/2018   ID:  [name], DOB [birth year], MRN [id number]  Patient Location:  Home  Provider location:   Office  PCP:  Janith Lima, MD  Cardiologist:  Pixie Casino, MD 01/09/2018 Electrophysiologist:  None   Chief Complaint:  SOB  History of Present Illness:    [name] is a 73 y.o. male who presents via audio/video conferencing for a telehealth visit today.    73 y.o. yo male who has a hx of morbid obesitys/pbariatric surgery (peak wt 394 lbs), PEon Xarelto>>d/c 2nd GIB 2nd  PUD>>Eliquis, pericardial effusion2nd pericarditis s/p window 2015, A. Fib,HTN, OSA on CPAP, nl MV 2015, D-CHF, CVA 2018  He has multiple problems. He is very SOB w/ minimal exertion. This has not changed recently.  He is compliant with CPAP, so denies PND.  He has chronic orthopnea.  He has been researching this and has come to the conclusion that it may be related to his size.  He is also at risk to have obesity hypoventilation syndrome.  His weight is up about 10 lbs, but he believes it is weight gain, not fluid.  He admits that he does not eat like he should. Is trying to do better.  He wishes he had a list of foods that he can eat with portion sizes so that he will know what to do.  He feels frustrated that he has a whole list of things that he is not supposed to eat but is not quite sure what he can have.  Ever since his CVA, he gets dizzy w/ certain movements and head turns. Not orthostatic in nature.  This makes it difficult sometimes for him to move around or do very much.  He has chronic pain issues, he is getting shots in his back. He also is having pain in his knees.  He had surgery on his knees a while back, is afraid they were getting bad again.  Eliquis is very expensive, could really use some help.   The patient does not have symptoms concerning for COVID-19 infection (fever, chills, cough, or new shortness of breath).  Prior CV studies:   The following studies were reviewed today:  ECHO: 06/19/2016 - Left ventricle: The cavity size was moderately dilated. Wall   thickness was increased in a pattern of mild LVH. Systolic   function was normal. The estimated ejection fraction was in the   range of 60% to 65%. Wall motion was normal; there were no   regional wall motion abnormalities. Doppler parameters are   consistent with abnormal left ventricular relaxation (grade 1   diastolic dysfunction). Doppler parameters are consistent with   indeterminate ventricular filling  pressure. - Aortic valve: Transvalvular velocity was within the normal range.   There was no stenosis. There was no regurgitation. - Mitral valve: Transvalvular velocity was within the normal range.   There was no evidence for stenosis. There was no regurgitation. - Left atrium: The atrium was mildly dilated. - Right ventricle: The cavity size was normal. Wall thickness was   normal. Systolic function was normal. - Right atrium: The atrium was mildly dilated. - Tricuspid valve: There was trivial regurgitation.  MYOVIEW: 05/17/2016  The left ventricular ejection fraction is normal (55-65%).  Nuclear stress EF: 55%.  There was no ST segment deviation noted during stress.  No T wave inversion was noted during stress.  The study is normal.  This is a low risk study.   Past Medical History:  Diagnosis Date  . Anemia 07/29/2013  . Asthma    childhood asthma  . Benign essential HTN   . Benign prostatic hypertrophy    several prostate biopsies neg for cancer.   . Bilateral hip pain   . Cancer ([place])   . CHF (congestive heart failure) ([place])   . Chronic anticoagulation 07/22/2013  . Complication of anesthesia    MORPHINE CAUSES HALLUCINATIONS  . CVA (cerebral vascular accident) ([place]) 06/2016  . Depression   . Diastolic congestive heart failure ([place]) 07/16/2013  . Dysphagia, post-stroke   . Embolic cerebral infarction ([place]) 06/21/2016  . FH: colonic polyps 2010  . Gout   . History of kidney stones   . Hypertension   . Kidney cysts   . Morbid obesity ([place])   . Numbness    feet / legs  . PAF (paroxysmal atrial fibrillation) with RVR 07/29/2013  . Pericarditis 2015   s/p window  . Recurrent pulmonary emboli ([place]) 06/13/2015  . Sleep apnea    USES C PAP  . Stroke syndrome   . 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 ARTHROSCOPY KNEE W/ DRILLING    . BREATH TEK H PYLORI N/A 12/23/2012   Procedure: BREATH TEK H PYLORI;  Surgeon:  Gayland Curry, MD;  Location: [facility];  Service: General;  Laterality: N/A;  . CHOLECYSTECTOMY  1989  . COLONOSCOPY N/A 07/31/2013   Procedure: COLONOSCOPY;  Surgeon: Gatha Mayer, MD;  Location: [place];  Service: Endoscopy;  Laterality: N/A;  . ESOPHAGOGASTRODUODENOSCOPY N/A 07/22/2013   Procedure: ESOPHAGOGASTRODUODENOSCOPY (EGD);  Surgeon: Irene Shipper, MD;  Location[facility];  Service: Endoscopy;  Laterality: N/A;  . ESOPHAGOGASTRODUODENOSCOPY N/A 07/31/2013   Procedure: ESOPHAGOGASTRODUODENOSCOPY (EGD);  Surgeon: Gatha Mayer, MD;  Location: [facility] ENDOSCOPY;  Service: Endoscopy;  Laterality: N/A;  . INTRAOPERATIVE TRANSESOPHAGEAL ECHOCARDIOGRAM N/A 07/18/2013   Procedure: INTRAOPERATIVE TRANSESOPHAGEAL ECHOCARDIOGRAM;  Surgeon: Grace Isaac, MD;  Location: [place];  Service: Open Heart Surgery;  Laterality: N/A;  . LAPAROSCOPIC GASTRIC BANDING WITH HIATAL HERNIA REPAIR N/A 04/08/2013   Procedure: LAPAROSCOPIC GASTRIC BANDING  WITH possible  HIATAL HERNIA REPAIR;  Surgeon: Gayland Curry, MD;  Location: [facility];  Service: General;  Laterality: N/A;  . LITHOTRIPSY    . renal calculi    . SUBXYPHOID PERICARDIAL WINDOW N/A 07/18/2013   Procedure: SUBXYPHOID PERICARDIAL WINDOW;  Surgeon: Grace Isaac, MD;  Location: [place];  Service: Thoracic;  Laterality: N/A;  . TONSILLECTOMY    . total knee replacment     bilat  . trigger finger repair     also lue, cts surgically repaired  . WISDOM TOOTH EXTRACTION       Current Meds  Medication Sig  . atorvastatin (LIPITOR) 80 MG tablet TAKE 1 TABLET BY MOUTH EVERY DAY  . Cyanocobalamin (VITAMIN B-12) 1000 MCG SUBL Place 5,000 mcg under the tongue every evening.   [name] Sodium (DSS) 100 MG CAPS Take 100 mg by mouth every other day.  [name] ELIQUIS 5 MG TABS tablet TAKE 1 TABLET BY MOUTH TWICE A DAY  . finasteride (PROSCAR) 5 MG tablet Take 1 tablet (5 mg total) by mouth daily.  . flecainide (TAMBOCOR) 50 MG tablet Take 1 tablet (50  mg total) by mouth every 12 (twelve) hours. OV NEEED  . loratadine (CLARITIN) 10 MG tablet Take 10 mg by mouth daily.  [name] LORazepam (ATIVAN) 0.5 MG tablet Take 1 tablet (0.5 mg total) by mouth at bedtime.  . montelukast (SINGULAIR) 10 MG tablet TAKE 1 TABLET BY MOUTH EVERYDAY AT BEDTIME  . pantoprazole (PROTONIX) 40 MG tablet Take 1 tablet (40 mg total) by mouth daily.  . potassium chloride SA (KLOR-CON M20) 20 MEQ tablet Take 2 tablets (40 mEq total) by mouth daily.  [name] rOPINIRole (REQUIP) 0.5 MG tablet Take 1 tablet (0.5 mg total) by mouth 3 (three) times daily.  . sertraline (ZOLOFT) 100 MG tablet Take 1.5 tablets (150 mg total) by mouth daily.  [name] torsemide (DEMADEX) 20 MG tablet TAKE 3 TABLETS (60 MG TOTAL) BY MOUTH DAILY. KEEP APPOINTMENT AS SCHEDULED.  [name] traMADol (ULTRAM) 50 MG tablet Take 1 tablet (50 mg total) by mouth as needed.     Allergies:   Morphine, Codeine, Penicillins, and Propoxyphene n-acetaminophen   Social History   Tobacco Use  . Smoking status: Former Smoker    Packs/day: 0.20    Years: 13.00    Pack years: 2.60    Types: Cigarettes    Start date: 04/03/1961    Quit date: 08/30/1974    Years since quitting: 44.1  . Smokeless tobacco: Never Used  Substance Use Topics  . Alcohol use: No    Alcohol/week: 0.0 standard drinks  . Drug use: No     Family Hx: The patient's family history includes Dementia in his mother; Depression in his brother and mother; Diabetes in his cousin and paternal uncle; Heart disease in his father, paternal grandmother, and paternal uncle; Hypertension in his mother; Stroke in his paternal aunt.  ROS:   Please see the history of present illness.    All other systems reviewed and are negative.   Labs/Other Tests and Data Reviewed:    Recent Labs: 05/16/2018: ALT 17; BUN 22; Creatinine, Ser 1.41; Hemoglobin 14.2; Platelets 232.0; Potassium 3.7; Sodium 142; TSH 2.04   Recent Lipid Panel Lab Results  Component Value Date/Time   CHOL  139 05/16/2018 04:16 PM   TRIG 203.0 (H) 05/16/2018 04:16 PM   HDL 29.70 (L) 05/16/2018 04:16 PM   CHOLHDL 5 05/16/2018 04:16 PM   LDLCALC 77 06/19/2016 07:30 AM  LDLDIRECT 91.0 05/16/2018 04:16 PM    Wt Readings from Last 3 Encounters:  10/03/18 (!) 382 lb (173.3 kg)  09/27/18 (!) 386 lb (175.1 kg)  09/12/18 (!) 375 lb (170.1 kg)     Objective:    Vital Signs:  Pulse 64   Ht 6' (1.829 m)   Wt (!) 382 lb (173.3 kg)   SpO2 98%   BMI 51.81 kg/m    73 y.o. male in no acute distress.   ASSESSMENT & PLAN:    1.  Chronic diastolic CHF: - His weight is almost up to the pre-bariatric surgery level, but he does not feel like he has been on fluid, he thinks he has put on actual weight. - He is compliant with the torsemide 60 mg a day. - His dose has not changed recently, and his renal function has been stable. - He is encouraged to manage his p.o. intake well and lose weight  2.  Super morbid obesity - He is almost back up to his pre-bariatric surgery weight. - We took a 30-day diet plan that includes 3 meals and 3 snacks a day and reviewed it, making sure that portion sizes were clear. - he will get this in the mail, and hopefully be able to manage his intake a little better.  3.  PAF: - He has not had palpitations, no presyncope or syncope. -He is not having any bleeding issues on the Eliquis -Continue flecainide  4.  Hypertension: - Blood pressure is generally well controlled, continue with current therapy.  5.  History of pericardial effusion with window - He is concerned that some of his dyspnea on exertion is related to his history of pericarditis and pericardial effusion - As he is chronically anticoagulated, it is reasonable to recheck his echocardiogram. - I explained that because of the pericardial window, he should not accumulate fluid anymore. -However, if the pericardium has become thickened or stiff, that could worsen his dyspnea on exertion.   COVID-19  Education: The signs and symptoms of COVID-19 were discussed with the patient and how to seek care for testing (follow up with PCP or arrange E-visit).  The importance of social distancing was discussed today.  Patient Risk:   After full review of this patient's clinical status, I feel that they are at least moderate risk at this time.  Time:   Today, I have spent 25 minutes with the patient with telehealth technology discussing cardiac and other health issues.     Medication Adjustments/Labs and Tests Ordered: Current medicines are reviewed at length with the patient today.  Concerns regarding medicines are outlined above.  Tests Ordered: Orders Placed This Encounter  Procedures  . ECHOCARDIOGRAM COMPLETE   Medication Changes: No orders of the defined types were placed in this encounter.   Disposition:  Follow up With Dr. Debara Pickett  Signed, Rosaria Ferries, PA-C  10/03/2018 2:15 PM    [place]

## 2018-10-10 ENCOUNTER — Other Ambulatory Visit: Payer: Self-pay

## 2018-10-10 ENCOUNTER — Other Ambulatory Visit (HOSPITAL_COMMUNITY): Payer: Medicare Other

## 2018-10-10 ENCOUNTER — Ambulatory Visit (HOSPITAL_COMMUNITY)
Admission: RE | Admit: 2018-10-10 | Discharge: 2018-10-10 | Disposition: A | Discharge status: Home / Self Care | Payer: Medicare Other | Source: Ambulatory Visit | Attending: Physician Assistant | Admitting: Physician Assistant

## 2018-10-10 DIAGNOSIS — I5032 Chronic diastolic (congestive) heart failure: Secondary | ICD-10-CM

## 2018-10-10 DIAGNOSIS — I110 Hypertensive heart disease with heart failure: Secondary | ICD-10-CM | POA: Diagnosis not present

## 2018-10-10 DIAGNOSIS — I10 Essential (primary) hypertension: Secondary | ICD-10-CM | POA: Diagnosis not present

## 2018-10-10 DIAGNOSIS — I480 Paroxysmal atrial fibrillation: Secondary | ICD-10-CM | POA: Diagnosis not present

## 2018-10-10 DIAGNOSIS — I509 Heart failure, unspecified: Secondary | ICD-10-CM | POA: Diagnosis present

## 2018-10-10 NOTE — Progress Notes (Signed)
  Echocardiogram 2D Echocardiogram has been performed.  [name] 10/10/2018, 3:04 PM

## 2018-10-11 ENCOUNTER — Telehealth: Payer: Self-pay | Admitting: Physical Medicine & Rehabilitation

## 2018-10-11 NOTE — Telephone Encounter (Signed)
Patient is calling to request an injection on his right side, having increased pain.  Please let us know which injection that we need to schedule patient for.

## 2018-10-13 ENCOUNTER — Other Ambulatory Visit: Payer: Self-pay

## 2018-10-13 ENCOUNTER — Emergency Department (HOSPITAL_COMMUNITY)
Admission: EM | Admit: 2018-10-13 | Discharge: 2018-10-14 | Disposition: A | Discharge status: Home / Self Care | Payer: Medicare Other | Attending: Emergency Medicine | Admitting: Emergency Medicine

## 2018-10-13 DIAGNOSIS — I110 Hypertensive heart disease with heart failure: Secondary | ICD-10-CM | POA: Diagnosis not present

## 2018-10-13 DIAGNOSIS — M5441 Lumbago with sciatica, right side: Secondary | ICD-10-CM | POA: Insufficient documentation

## 2018-10-13 DIAGNOSIS — M545 Low back pain: Secondary | ICD-10-CM | POA: Diagnosis present

## 2018-10-13 DIAGNOSIS — I5032 Chronic diastolic (congestive) heart failure: Secondary | ICD-10-CM | POA: Insufficient documentation

## 2018-10-13 DIAGNOSIS — Z87891 Personal history of nicotine dependence: Secondary | ICD-10-CM | POA: Diagnosis not present

## 2018-10-13 DIAGNOSIS — G8929 Other chronic pain: Secondary | ICD-10-CM | POA: Diagnosis not present

## 2018-10-13 DIAGNOSIS — Z7901 Long term (current) use of anticoagulants: Secondary | ICD-10-CM | POA: Insufficient documentation

## 2018-10-13 DIAGNOSIS — M5489 Other dorsalgia: Secondary | ICD-10-CM | POA: Diagnosis not present

## 2018-10-13 DIAGNOSIS — J45909 Unspecified asthma, uncomplicated: Secondary | ICD-10-CM | POA: Diagnosis not present

## 2018-10-13 DIAGNOSIS — Z8673 Personal history of transient ischemic attack (TIA), and cerebral infarction without residual deficits: Secondary | ICD-10-CM | POA: Insufficient documentation

## 2018-10-13 DIAGNOSIS — Z79899 Other long term (current) drug therapy: Secondary | ICD-10-CM | POA: Diagnosis not present

## 2018-10-13 DIAGNOSIS — R52 Pain, unspecified: Secondary | ICD-10-CM | POA: Diagnosis not present

## 2018-10-13 DIAGNOSIS — M5136 Other intervertebral disc degeneration, lumbar region: Secondary | ICD-10-CM | POA: Diagnosis not present

## 2018-10-13 MED ORDER — HYDROCODONE-ACETAMINOPHEN 5-325 MG PO TABS
2.0000 | ORAL_TABLET | Freq: Once | ORAL | Status: AC
  Administered 2018-10-13: 21:00:00 2 via ORAL
  Filled 2018-10-13: qty 2

## 2018-10-13 MED ORDER — ONDANSETRON HCL 4 MG/2ML IJ SOLN
4.0000 mg | Freq: Once | INTRAMUSCULAR | Status: AC
  Administered 2018-10-13: 4 mg via INTRAVENOUS
  Filled 2018-10-13: qty 2

## 2018-10-13 MED ORDER — HYDROMORPHONE HCL 1 MG/ML IJ SOLN
0.5000 mg | Freq: Once | INTRAMUSCULAR | Status: AC
  Administered 2018-10-13: 23:00:00 0.5 mg via INTRAVENOUS
  Filled 2018-10-13: qty 1

## 2018-10-13 MED ORDER — ONDANSETRON HCL 4 MG/2ML IJ SOLN: 4.0000 mg | Freq: Once | INTRAMUSCULAR | Status: DC

## 2018-10-13 MED ORDER — HYDROMORPHONE HCL 1 MG/ML IJ SOLN
0.5000 mg | Freq: Once | INTRAMUSCULAR | Status: AC
  Administered 2018-10-13: 0.5 mg via INTRAVENOUS
  Filled 2018-10-13: qty 1

## 2018-10-13 NOTE — ED Triage Notes (Addendum)
Pt here from home with c/o back pain 10/10.  Pt states the back pain is a chronic issue and has taken 100mg  of Tramadol with no relief. Pt A/O.

## 2018-10-13 NOTE — ED Provider Notes (Signed)
[facility] EMERGENCY DEPARTMENT Provider Note   CSN: [id number] Arrival date & time: 10/13/18  1938    History   Chief Complaint No chief complaint on file.   HPI [name] is a 73 y.o. male with history of CVA, CHF, morbid obesity, kidney stones, chronic numbness of the legs and feet, PAF, sleep apnea and chronic back pain presents today for worsening of his chronic back pain.  She reports that this morning when he woke up he had stooled on himself, he reports that this is new for him.  After he got out of bed he went and took a bath and reports after the bath he had an increase in his right-sided lower back pain.  He reports that this is consistent with his typical back pain he describes as a sharp shooting pain down his right leg to his right foot that is intermittent worsened with certain movements and leg raise and without alleviating factors.  He took 100 mg of tramadol prior to arrival without improvement of symptoms.  Patient reports that since he woke up today he has been able to control his bowel and bladder movements without difficulty he denies any urinary retention or incontinence, additionally he denies any saddle area paresthesias today.  He denies change to his chronic pain, fall/injury or any other concerns.  Patient states he will follow-up with his PCP for a steroid injection next week or so but has not yet scheduled this appointment.     HPI  Past Medical History:  Diagnosis Date  . Anemia 07/29/2013  . Asthma    childhood asthma  . Benign essential HTN   . Benign prostatic hypertrophy    several prostate biopsies neg for cancer.   . Bilateral hip pain   . Cancer ([place])   . CHF (congestive heart failure) ([place])   . Chronic anticoagulation 07/22/2013  . Complication of anesthesia    MORPHINE CAUSES HALLUCINATIONS  . CVA (cerebral vascular accident) ([place]) 06/2016  . Depression   . Diastolic congestive heart failure ([place]) 07/16/2013  .  Dysphagia, post-stroke   . Embolic cerebral infarction ([place]) 06/21/2016  . FH: colonic polyps 2010  . Gout   . History of kidney stones   . Hypertension   . Kidney cysts   . Morbid obesity ([place])   . Numbness    feet / legs  . PAF (paroxysmal atrial fibrillation) with RVR 07/29/2013  . Pericarditis 2015   s/p window  . Recurrent pulmonary emboli ([place]) 06/13/2015  . Sleep apnea    USES C PAP  . Stroke syndrome   . 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 B12 deficiency 05/16/2018  . Lumbosacral spondylosis without myelopathy 12/18/2017  . 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 Vocal cord dysfunction 11/22/2016  . Benign essential HTN   . 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 Embolic cerebral infarction ([place]) 06/21/2016  . OSA (obstructive sleep apnea)   . Dysphagia, post-stroke   . Neurologic gait disorder   . Encounter for monitoring flecainide therapy 04/28/2016  . Recurrent pulmonary emboli ([place]) 06/13/2015  . GERD (gastroesophageal reflux disease) 06/13/2015  . Basal cell carcinoma of skin 06/28/2014  . 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 PAF (paroxysmal atrial fibrillation) ([place]) 07/29/2013  . Chronic anticoagulation 07/22/2013  . Gastric ulcer with hemorrhage 07/20/13 07/22/2013  . Diastolic congestive heart failure ([place]) 07/16/2013  . H/O laparoscopic adjustable gastric banding &  hiatal hernia repair 04/08/13 04/23/2013  . Prediabetes 02/05/2013  . Morbid obesity ([place]) 08/09/2012  . Peripheral neuropathy ([place]) 11/02/2009  . Hyperlipidemia with target LDL less than 130 09/10/2008  . GOUT 06/28/2006  . BPH (benign prostatic hyperplasia) 06/28/2006    Past Surgical History:  Procedure Laterality Date  . ARTHROSCOPY KNEE W/ DRILLING    . BREATH TEK H PYLORI N/A 12/23/2012   Procedure: BREATH TEK H PYLORI;  Surgeon: Gayland Curry, MD;  Location: [facility];  Service: General;  Laterality: N/A;  .  CHOLECYSTECTOMY  1989  . COLONOSCOPY N/A 07/31/2013   Procedure: COLONOSCOPY;  Surgeon: Gatha Mayer, MD;  Location: [place];  Service: Endoscopy;  Laterality: N/A;  . ESOPHAGOGASTRODUODENOSCOPY N/A 07/22/2013   Procedure: ESOPHAGOGASTRODUODENOSCOPY (EGD);  Surgeon: Irene Shipper, MD;  Location: [facility] ENDOSCOPY;  Service: Endoscopy;  Laterality: N/A;  . ESOPHAGOGASTRODUODENOSCOPY N/A 07/31/2013   Procedure: ESOPHAGOGASTRODUODENOSCOPY (EGD);  Surgeon: Gatha Mayer, MD;  Location[facility] END[facility]COPY;  Service: Endoscopy;  Laterality: N/A;  . INTRAOPERATIVE TRANSESOPHAGEAL ECHOCARDIOGRAM N/A 07/18/2013   Procedure: INTRAOPERATIVE TRANSESOPHAGEAL ECHOCARDIOGRAM;  Surgeon: Grace Isaac, MD;  Location: [place];  Service: Open Heart Surgery;  Laterality: N/A;  . LAPAROSCOPIC GASTRIC BANDING WITH HIATAL HERNIA REPAIR N/A 04/08/2013   Procedure: LAPAROSCOPIC GASTRIC BANDING WITH possible  HIATAL HERNIA REPAIR;  Surgeon: Gayland Curry, MD;  Location: [facility];  Service: General;  Laterality: N/A;  . LITHOTRIPSY    . renal calculi    . SUBXYPHOID PERICARDIAL WINDOW N/A 07/18/2013   Procedure: SUBXYPHOID PERICARDIAL WINDOW;  Surgeon: Grace Isaac, MD;  Location: [place];  Service: Thoracic;  Laterality: N/A;  . TONSILLECTOMY    . total knee replacment     bilat  . trigger finger repair     also lue, cts surgically repaired  . WISDOM TOOTH EXTRACTION          Home Medications    Prior to Admission medications   Medication Sig Start Date End Date Taking? Authorizing Provider  atorvastatin (LIPITOR) 80 MG tablet TAKE 1 TABLET BY MOUTH EVERY DAY Patient taking differently: Take 80 mg by mouth daily.  01/25/18  Yes Janith Lima, MD  Cyanocobalamin (VITAMIN B-12 PO) Take 1 tablet by mouth at bedtime.   Yes [provider]  Docusate Sodium (DSS) 100 MG CAPS Take 100 mg by mouth every other day. 07/25/13  Yes Kelvin Cellar, MD  ELIQUIS 5 MG TABS tablet TAKE 1 TABLET BY MOUTH TWICE A DAY  Patient taking differently: Take 5 mg by mouth 2 (two) times daily.  01/08/18  Yes Janith Lima, MD  finasteride (PROSCAR) 5 MG tablet Take 1 tablet (5 mg total) by mouth daily. 07/12/16  Yes Angiulli, Lavon Paganini, PA-C  flecainide (TAMBOCOR) 50 MG tablet Take 1 tablet (50 mg total) by mouth every 12 (twelve) hours. OV NEEED 08/07/18  Yes [name], [name], MD  loratadine (CLARITIN) 10 MG tablet Take 10 mg by mouth daily.   Yes [provider]  LORazepam (ATIVAN) 0.5 MG tablet Take 1 tablet (0.5 mg total) by mouth at bedtime. 05/15/18 05/15/19 Yes Arfeen, Arlyce Harman, MD  montelukast (SINGULAIR) 10 MG tablet TAKE 1 TABLET BY MOUTH EVERYDAY AT BEDTIME Patient taking differently: Take 10 mg by mouth at bedtime.  03/13/18  Yes Janith Lima, MD  OVER THE COUNTER MEDICATION Take 1 tablet by mouth daily. Multi vitamin with iron   Yes [provider]  pantoprazole (PROTONIX) 40 MG tablet  Take 1 tablet (40 mg total) by mouth daily. 07/15/18  Yes Janith Lima, MD  potassium chloride SA (KLOR-CON M20) 20 MEQ tablet Take 2 tablets (40 mEq total) by mouth daily. Patient taking differently: Take 20 mEq by mouth See admin instructions. Take one table (20 meq) by mouth twice daily - afternoon and bedtime 10/09/17  Yes Barrett, Evelene Croon, PA-C  PRESCRIPTION MEDICATION Inhale into the lungs at bedtime. CPAP - setting 20   Yes [provider]  rOPINIRole (REQUIP) 0.5 MG tablet Take 1 tablet (0.5 mg total) by mouth 3 (three) times daily. Patient taking differently: Take 1.5 mg by mouth at bedtime.  08/13/18  Yes Young, Tarri Fuller D, MD  sertraline (ZOLOFT) 100 MG tablet Take 1.5 tablets (150 mg total) by mouth daily. Patient taking differently: Take 150 mg by mouth at bedtime.  05/15/18  Yes Arfeen, Arlyce Harman, MD  Tetrahydrozoline HCl (VISINE OP) Place 1 drop into both eyes daily as needed (dry eyes).   Yes [provider]  torsemide (DEMADEX) 20 MG tablet TAKE 3 TABLETS (60 MG TOTAL) BY MOUTH  DAILY. KEEP APPOINTMENT AS SCHEDULED. 03/25/18  Yes [name], [name], MD  traMADol (ULTRAM) 50 MG tablet Take 1 tablet (50 mg total) by mouth as needed. Patient taking differently: Take 50 mg by mouth 2 (two) times daily as needed (pain).  07/09/18  Yes Kirsteins, Luanna Salk, MD    Family History Family History  Problem Relation Age of Onset  . Depression Mother   . Hypertension Mother   . Dementia Mother   . Heart disease Father        MI  . Depression Brother   . Stroke Paternal Aunt        CVA  . Diabetes Paternal Uncle   . Heart disease Paternal Uncle        MI  . Heart disease Paternal Grandmother        MI  . Diabetes Cousin        PATERNAL    Social History Social History   Tobacco Use  . Smoking status: Former Smoker    Packs/day: 0.20    Years: 13.00    Pack years: 2.60    Types: Cigarettes    Start date: 04/03/1961    Quit date: 08/30/1974    Years since quitting: 44.1  . Smokeless tobacco: Never Used  Substance Use Topics  . Alcohol use: No    Alcohol/week: 0.0 standard drinks  . Drug use: No     Allergies   Morphine, Codeine, Penicillins, and Propoxyphene n-acetaminophen   Review of Systems Review of Systems  Constitutional: Negative.  Negative for chills and fever.       Denies IV drug use  Respiratory: Negative.  Negative for cough and shortness of breath.   Cardiovascular: Negative.  Negative for chest pain.  Gastrointestinal: Negative.  Negative for abdominal pain, diarrhea, nausea and vomiting.  Musculoskeletal: Positive for back pain. Negative for neck pain.  Neurological: Positive for numbness (Ports chronic numbness to bilateral feet without change). Negative for syncope, weakness and headaches.       Reports 1 incontinence today while sleeping.  None since.  Throughout the stay he denies bowel/bladder incontinence, urinary retention or saddle area paresthesias.  Ten systems are reviewed and are negative for acute change except as noted in the  HPI and ROS as above.  Physical Exam Updated Vital Signs BP (!) 125/57   Pulse (!) 53   Temp 97.7  F (36.5 C) (Oral)   Resp (!) 9   SpO2 94%   Physical Exam Constitutional:      General: He is not in acute distress.    Appearance: Normal appearance. He is well-developed. He is obese. He is not ill-appearing or diaphoretic.  HENT:     Head: Normocephalic and atraumatic.     Right Ear: External ear normal.     Left Ear: External ear normal.     Nose: Nose normal.  Eyes:     General: Vision grossly intact. Gaze aligned appropriately.     Pupils: Pupils are equal, round, and reactive to light.  Neck:     Musculoskeletal: Normal range of motion.     Trachea: Trachea and phonation normal. No tracheal deviation.  Cardiovascular:     Pulses:          Dorsalis pedis pulses are 2+ on the right side and 2+ on the left side.  Pulmonary:     Effort: Pulmonary effort is normal. No respiratory distress.  Abdominal:     General: There is no distension.     Palpations: Abdomen is soft.     Tenderness: There is no abdominal tenderness. There is no guarding or rebound.  Genitourinary:    Comments: Rectal exam chaperoned by Judson Roch RN  Good rectal tone with appropriate squeeze strength.  No blood on examination. - Post void residual on bladder scan 30 mL. Musculoskeletal: Normal range of motion.       Back:     Comments: No midline C/T/L spinal tenderness to palpation, no deformity, crepitus, or step-off noted. No sign of injury to the neck or back. - Patient with tenderness along the right gluteus medius. - Positive right leg raise  Skin:    General: Skin is warm and dry.  Neurological:     Mental Status: He is alert.     GCS: GCS eye subscore is 4. GCS verbal subscore is 5. GCS motor subscore is 6.     Comments: Speech is clear and goal oriented, follows commands Major Cranial nerves without deficit, no facial droop Moves extremities without ataxia, coordination intact   Psychiatric:        Behavior: Behavior normal.    ED Treatments / Results  Labs (all labs ordered are listed, but only abnormal results are displayed) Labs Reviewed - No data to display  EKG None  Radiology No results found.  Procedures Procedures (including critical care time)  Medications Ordered in ED Medications  ondansetron (ZOFRAN) injection 4 mg (4 mg Intravenous Not Given 10/13/18 2334)  ondansetron (ZOFRAN) injection 4 mg (has no administration in time range)  HYDROmorphone (DILAUDID) injection 0.5 mg (has no administration in time range)  HYDROcodone-acetaminophen (NORCO/VICODIN) 5-325 MG per tablet 2 tablet (2 tablets Oral Given 10/13/18 2049)  HYDROmorphone (DILAUDID) injection 0.5 mg (0.5 mg Intravenous Given 10/13/18 2304)     Initial Impression / Assessment and Plan / ED Course  I have reviewed the triage vital signs and the nursing notes.  Pertinent labs & imaging results that were available during my care of the patient were reviewed by me and considered in my medical decision making (see chart for details).    [name] is a 73 y.o. male presenting with right lower back pain he describes as a worsening of his typical back pain. Patient denies history of trauma, fever, IV drug use, night sweats, weight loss, cancer, saddle anesthesia, urinary rentention.  Patient reported 1 bowel movement while  he was sleeping, he has not had any bowel or bladder incontinence since that time, he has normal rectal tone on exam and no urinary retention.  Suspect musculoskeletal etiology of patient's pain. Pain is consistently reproducible with palpation of the back musculature and right-sided straight leg raise. Abdomen soft/nontender and without pulsatile mass. Patient with equal pedal pulses. Doubt spinal epidural abscess, cauda equina or AAA.  No imaging indicated at this time.  Attempted pain control with 2 Norco unsuccessful, 0.5 mg Dilaudid given patient with some  nausea there after Zofran ordered an additional Dilaudid as pain is still not controlled.  Patient seen and evaluated by Dr. Maryan Rued, plan of care at this time is pain control and then discharge with outpatient follow-up. - 12:20 AM: Patient reassessed resting comfortably and in no acute distress.  Reports no improvement of pain following medications as above.  Additional pain control ordered.  Vital signs remained stable. - Care handoff given to Quincy Carnes PA-C at shift change. Plan of care is pain control, reassessment and discharge with PCP follow-up.  Note: Portions of this report may have been transcribed using voice recognition software. Every effort was made to ensure accuracy; however, inadvertent computerized transcription errors may still be present. Final Clinical Impressions(s) / ED Diagnoses   Final diagnoses:  Chronic right-sided low back pain with right-sided sciatica    ED Discharge Orders    None       Gari Crown 10/14/18 0024    Blanchie Dessert, MD 10/22/18 0004

## 2018-10-13 NOTE — ED Notes (Signed)
Pt voided 235ml with a post void residual of 32. PA notified

## 2018-10-14 ENCOUNTER — Emergency Department (HOSPITAL_COMMUNITY): Payer: Medicare Other

## 2018-10-14 ENCOUNTER — Telehealth: Payer: Self-pay | Admitting: *Deleted

## 2018-10-14 DIAGNOSIS — M5441 Lumbago with sciatica, right side: Secondary | ICD-10-CM | POA: Diagnosis not present

## 2018-10-14 DIAGNOSIS — M5136 Other intervertebral disc degeneration, lumbar region: Secondary | ICD-10-CM | POA: Diagnosis not present

## 2018-10-14 LAB — BASIC METABOLIC PANEL
Anion gap: 10 (ref 5–15)
BUN: 21 mg/dL (ref 8–23)
CO2: 29 mmol/L (ref 22–32)
Calcium: 8.1 mg/dL — ABNORMAL LOW (ref 8.9–10.3)
Chloride: 101 mmol/L (ref 98–111)
Creatinine, Ser: 1.3 mg/dL — ABNORMAL HIGH (ref 0.61–1.24)
GFR calc Af Amer: >60 mL/min (ref >60)
GFR calc non Af Amer: 55 mL/min — ABNORMAL LOW (ref >60)
Glucose, Bld: 124 mg/dL — ABNORMAL HIGH (ref 70–99)
Potassium: 3.5 mmol/L (ref 3.5–5.1)
Sodium: 140 mmol/L (ref 135–145)

## 2018-10-14 LAB — CBC WITH DIFFERENTIAL/PLATELET
Abs Immature Granulocytes: 0.05 10*3/uL (ref 0.00–0.07)
Basophils Absolute: 0.1 10*3/uL (ref 0.0–0.1)
Basophils Relative: 1 %
Eosinophils Absolute: 0.2 10*3/uL (ref 0.0–0.5)
Eosinophils Relative: 2 %
HCT: 45.3 % (ref 39.0–52.0)
Hemoglobin: 13.9 g/dL (ref 13.0–17.0)
Immature Granulocytes: 1 %
Lymphocytes Relative: 22 %
Lymphs Abs: 2.1 10*3/uL (ref 0.7–4.0)
MCH: 29 pg (ref 26.0–34.0)
MCHC: 30.7 g/dL (ref 30.0–36.0)
MCV: 94.6 fL (ref 80.0–100.0)
Monocytes Absolute: 1 10*3/uL (ref 0.1–1.0)
Monocytes Relative: 10 %
Neutro Abs: 6.3 10*3/uL (ref 1.7–7.7)
Neutrophils Relative %: 64 %
Platelets: 212 10*3/uL (ref 150–400)
RBC: 4.79 MIL/uL (ref 4.22–5.81)
RDW: 13.6 % (ref 11.5–15.5)
WBC: 9.7 10*3/uL (ref 4.0–10.5)
nRBC: 0 % (ref 0.0–0.2)

## 2018-10-14 MED ORDER — HYDROMORPHONE HCL 1 MG/ML IJ SOLN
1.0000 mg | Freq: Once | INTRAMUSCULAR | Status: AC
  Administered 2018-10-14: 1 mg via INTRAVENOUS
  Filled 2018-10-14: qty 1

## 2018-10-14 MED ORDER — PREDNISONE 20 MG PO TABS: ORAL_TABLET | ORAL | 0 refills | Status: DC

## 2018-10-14 MED ORDER — KETOROLAC TROMETHAMINE 30 MG/ML IJ SOLN: 30.0000 mg | Freq: Once | INTRAMUSCULAR | Status: DC

## 2018-10-14 MED ORDER — FENTANYL CITRATE (PF) 100 MCG/2ML IJ SOLN
50.0000 ug | Freq: Once | INTRAMUSCULAR | Status: AC
  Administered 2018-10-14: 50 ug via INTRAVENOUS
  Filled 2018-10-14: qty 2

## 2018-10-14 MED ORDER — LIDOCAINE 5 % EX PTCH: 1.0000 | MEDICATED_PATCH | CUTANEOUS | 0 refills | Status: DC

## 2018-10-14 MED ORDER — OXYCODONE-ACETAMINOPHEN 5-325 MG PO TABS: 1.0000 | ORAL_TABLET | ORAL | 0 refills | Status: DC | PRN

## 2018-10-14 MED ORDER — DEXAMETHASONE SODIUM PHOSPHATE 10 MG/ML IJ SOLN
10.0000 mg | Freq: Once | INTRAMUSCULAR | Status: AC
  Administered 2018-10-14: 10 mg via INTRAVENOUS
  Filled 2018-10-14: qty 1

## 2018-10-14 NOTE — Telephone Encounter (Signed)
Right L3-L4-L5 medial branch blocks

## 2018-10-14 NOTE — ED Notes (Signed)
Placed pt on 2L [place] due to pain med admin, sleep apnea, and pt being transported to MRI

## 2018-10-14 NOTE — ED Notes (Signed)
Tech attempted to ambulate pt. Pt was only able to sit on side of the bed but was not able to walk due to dizziness and back pain.

## 2018-10-14 NOTE — ED Notes (Signed)
Assisted with ambulating pt pt attempted to ambulate pt was only able to stand up pt complains of back pain RN aware no other complaints noted at this time

## 2018-10-14 NOTE — ED Provider Notes (Signed)
Assumed care from PA Laie at shift change.  See prior notes for full H&P.  Briefly, 73 y.o. M here with right lower back pain.  Hx of same but states worse.  Was incontinent of stool upon waking but able to control bowels/bladder throughout the day today.  No focal deficits noted on exam.    Plan:  Pain control.  Can discharge once ambulatory.  1:41 AM Notified by RN that patient unable to even get out of bed.  Patient states he is unable to ambulate due to pain.  I have gone back and talked with him, states he does generally have pain on his right side, occasionally on the left.  States it has been bothering him over the past few days but got worse today after taking a bath this morning.  States his initial injury was many years ago, but something today feels different.  He continues to have States he has never been in pain like this before.  He does not recall ever having MRI.  He does get injections under fluoroscopy, last one 09/27/18.  At this point, patient has received several doses of narcotics without relief, still cannot even get out of bed.  Will get MRI, screening labs ordered.  Given dose of decadron and fentanyl.  Results for orders placed or performed during the hospital encounter of 10/13/18  CBC with Differential  Result Value Ref Range   WBC 9.7 4.0 - 10.5 K/uL   RBC 4.79 4.22 - 5.81 MIL/uL   Hemoglobin 13.9 13.0 - 17.0 g/dL   HCT 45.3 39.0 - 52.0 %   MCV 94.6 80.0 - 100.0 fL   MCH 29.0 26.0 - 34.0 pg   MCHC 30.7 30.0 - 36.0 g/dL   RDW 13.6 11.5 - 15.5 %   Platelets 212 150 - 400 K/uL   nRBC 0.0 0.0 - 0.2 %   Neutrophils Relative % 64 %   Neutro Abs 6.3 1.7 - 7.7 K/uL   Lymphocytes Relative 22 %   Lymphs Abs 2.1 0.7 - 4.0 K/uL   Monocytes Relative 10 %   Monocytes Absolute 1.0 0.1 - 1.0 K/uL   Eosinophils Relative 2 %   Eosinophils Absolute 0.2 0.0 - 0.5 K/uL   Basophils Relative 1 %   Basophils Absolute 0.1 0.0 - 0.1 K/uL   Immature Granulocytes 1 %   Abs Immature  Granulocytes 0.05 0.00 - 0.07 K/uL  Basic metabolic panel  Result Value Ref Range   Sodium 140 135 - 145 mmol/L   Potassium 3.5 3.5 - 5.1 mmol/L   Chloride 101 98 - 111 mmol/L   CO2 29 22 - 32 mmol/L   Glucose, Bld 124 (H) 70 - 99 mg/dL   BUN 21 8 - 23 mg/dL   Creatinine, Ser 1.30 (H) 0.61 - 1.24 mg/dL   Calcium 8.1 (L) 8.9 - 10.3 mg/dL   GFR calc non Af Amer 55 (L) >60 mL/min   GFR calc Af Amer >60 >60 mL/min   Anion gap 10 5 - 15   Mr Lumbar Spine Wo Contrast  Result Date: 10/14/2018 CLINICAL DATA:  73 year old male with increased chronic back pain, right side pain, incontinence. EXAM: MRI LUMBAR SPINE WITHOUT CONTRAST TECHNIQUE: Multiplanar, multisequence MR imaging of the lumbar spine was performed. No intravenous contrast was administered. COMPARISON:  Lumbar radiographs 11/07/2016. FINDINGS: Segmentation:  Normal on the comparison radiographs. Alignment: Mild retrolisthesis of L4 on L5 measures up to 5 millimeters and is new from the prior radiographs.  Stable straightening of lumbar lordosis otherwise. Vertebrae: Benign vertebral body hemangioma in L4. No marrow edema or evidence of acute osseous abnormality. Normal background bone marrow signal. Intact visible sacrum and SI joints. Conus medullaris and cauda equina: Conus extends to the L1 level. No lower spinal cord or conus signal abnormality. Lipoma of the filum terminalis, normal variant (series 9, image 11). Paraspinal and other soft tissues: Partially visible benign appearing renal cysts. Negative visible other abdominal viscera. Negative visualized posterior paraspinal soft tissues. Disc levels: T11-T12: Partially visible, negative. T12-L1:  Minimal disc bulge. L1-L2:  Negative. L2-L3:  Minimal disc bulge.  Mild facet hypertrophy.  No stenosis. L3-L4: Mostly far lateral mild disc bulging with endplate spurring. Mild to moderate facet hypertrophy. No spinal or lateral recess stenosis. Borderline to mild bilateral L3 foraminal stenosis.  L4-L5: Chronic disc space loss. Retrolisthesis with circumferential disc osteophyte complex. Mild to moderate facet hypertrophy. No significant spinal stenosis. Mild bilateral lateral recess stenosis (descending L5 nerve levels). Superimposed left foraminal to far lateral disc protrusion (series 5, image 14). Moderate to severe left and mild to moderate right L4 foraminal stenosis. L5-S1: Negative disc. Mild to moderate facet hypertrophy. No stenosis. IMPRESSION: 1. L4-L5 advanced disc and endplate degeneration, and moderate posterior element degeneration where mild retrolisthesis appears increased since 2018. Moderate to severe L4 foraminal stenosis is greater on the left. Mild bilateral L5 nerve level lateral recess stenosis. 2. Mild for age lumbar spine degeneration elsewhere with no other convincing neural impingement. Electronically Signed   By: Genevie Ann M.D.   On: 10/14/2018 03:29    4:58 AM Patient reported he was feeling somewhat better after additional medications.  We have reviewed his MRI results.  He was able to stand at the bedside, states he did feel a little woozy from the medications.  Have had a long discussion with him regarding inpatient versus outpatient management.  He does have some concerns about admission as I discussed with him that likely no acute intervention, simply pain control, he is also questioning if insurance will cover this to which I told him I could not be certain of that.  Ultimately, patient has decided to go home.  Will increase his pain medication, add Lidoderm patch and prednisone.  I have encouraged him to follow-up today with his physician to try and schedule repeat nerve block.  He does tell me that he has a cane and walker that he can use at home for assistance.  He can return here for any new/acute changes.   Larene Pickett, PA-C 10/14/18 [id number]    Randal Buba, April, MD 10/14/18 [phone]

## 2018-10-14 NOTE — ED Notes (Signed)
Pt taken to MRI  

## 2018-10-14 NOTE — Telephone Encounter (Signed)
This was already addressed I will do some lumbar medial branch blocks L3-L4-L5 please schedule

## 2018-10-14 NOTE — Telephone Encounter (Signed)
[name] called back after asking for another injection. He was in so much pain this weekend that he went to the ED. (see note) They treated him with medication and did an MRI. He was given #20 percocet 5/325 and a steroid 3 day burst. He has not started either yet but has gotten them from the pharmacy.  He was wanting to make sure it was ok and to know about the injection.  He says the last one worked well until last week.  Please advise.

## 2018-10-14 NOTE — Discharge Instructions (Addendum)
Take the prescribed medication as directed.  You may use heat or ice on the back as well.  I recommend using her cane or a walker to help with ambulation. Follow-up with your primary care doctor.  Also recommend reaching out to your pain management physician. Return to the ED for new or worsening symptoms.

## 2018-10-14 NOTE — Telephone Encounter (Signed)
Notified  [name] that office will call him tomorrow to set up appt for injection and he can take the medication.

## 2018-10-14 NOTE — ED Notes (Signed)
Pt tried to ambulate with assistance unable to RN aware

## 2018-10-16 ENCOUNTER — Telehealth: Payer: Self-pay

## 2018-10-16 NOTE — Telephone Encounter (Signed)
Patient called today, stated is almost out of his Oxycodone-apap prescription that he recieved from the emergency room.  Was given 20 tabs and told to take every 4 hours.  Has appointment scheduled for 10-24-2018 for back injections and medication will run out before that time.

## 2018-10-17 ENCOUNTER — Other Ambulatory Visit: Payer: Self-pay

## 2018-10-17 MED ORDER — FLECAINIDE ACETATE 50 MG PO TABS: 50.0000 mg | ORAL_TABLET | Freq: Two times a day (BID) | ORAL | 3 refills | Status: DC

## 2018-10-18 MED ORDER — TRAMADOL HCL 50 MG PO TABS: 50.0000 mg | ORAL_TABLET | Freq: Three times a day (TID) | ORAL | 1 refills | Status: DC | PRN

## 2018-10-21 ENCOUNTER — Encounter: Payer: Self-pay | Admitting: Adult Health

## 2018-10-21 ENCOUNTER — Ambulatory Visit (INDEPENDENT_AMBULATORY_CARE_PROVIDER_SITE_OTHER): Payer: Medicare Other | Admitting: Adult Health

## 2018-10-21 ENCOUNTER — Other Ambulatory Visit: Payer: Self-pay

## 2018-10-21 VITALS — BP 167/91 | HR 75 | Temp 97.5°F | Ht 72.0 in | Wt 387.8 lb

## 2018-10-21 DIAGNOSIS — G8929 Other chronic pain: Secondary | ICD-10-CM | POA: Diagnosis not present

## 2018-10-21 DIAGNOSIS — I63112 Cerebral infarction due to embolism of left vertebral artery: Secondary | ICD-10-CM | POA: Diagnosis not present

## 2018-10-21 DIAGNOSIS — I480 Paroxysmal atrial fibrillation: Secondary | ICD-10-CM | POA: Diagnosis not present

## 2018-10-21 DIAGNOSIS — I10 Essential (primary) hypertension: Secondary | ICD-10-CM | POA: Diagnosis not present

## 2018-10-21 DIAGNOSIS — E785 Hyperlipidemia, unspecified: Secondary | ICD-10-CM

## 2018-10-21 DIAGNOSIS — M545 Low back pain, unspecified: Secondary | ICD-10-CM

## 2018-10-21 NOTE — Patient Instructions (Signed)
Continue Eliquis (apixaban) daily  and lipitor  for secondary stroke prevention  Continue to follow up with PCP regarding cholesterol and blood pressure management   Continue to follow with cardiology for atrial fibrillation management  Continue to follow with Dr. Letta Pate for back pain management  Continue to monitor blood pressure at home  Maintain strict control of hypertension with blood pressure goal below 130/90, diabetes with hemoglobin A1c goal below 6.5% and cholesterol with LDL cholesterol (bad cholesterol) goal below 70 mg/dL. I also advised the patient to eat a healthy diet with plenty of whole grains, cereals, fruits and vegetables, exercise regularly and maintain ideal body weight.        Thank you for coming to see [place] at [facility]. I hope we have been able to provide you high quality care today.  You may receive a patient satisfaction survey over the next few weeks. We would appreciate your feedback and comments so that we may continue to improve ourselves and the health of our patients.

## 2018-10-21 NOTE — Progress Notes (Signed)
[facility] [address] [place]. [place] [address] [phone]       OFFICE FOLLOW-UP NOTE  [name] Date of Birth:  1945/10/22 Medical Record Number:  [id number]   HPI:  10/21/18 VISIT Mr. Bilotti is a 73 year old male who is being seen today for stroke follow-up.  Has been stable from a stroke standpoint without residual deficits or reoccurring of symptoms.  Continues on Eliquis for atrial fibrillation and secondary stroke prevention without side effects of bleeding or bruising.  Continues on atorvastatin without side effects myalgias.  Blood pressure elevated at 167/91, asymptotic. He felt stressed coming to appointment as he was running late. Unable to monitor blood pressure at home as home cuffs do not work appropriately.  At prior visit, patient had complaints of bilateral lower extremity muscle fatigue/weakness with occasional giving out.  EMG/NCS obtained on 04/25/2018 which did show severe neuropathy but did not show evidence of nerve impingement.  He did receive injections by Dr. Wynona Canes 09/27/2018.  He presented to ED on 10/14/2018 with a worsening right-sided back pain.  He obtained MRI and received additional medications with benefit (see ED notes from 10/13/2018). He was instructed to contact pain provider for additional injections. He plans on receiving injections on 10/24/18. He does endorse some improvement overall. He is interested in pursing surgical options is able due to severe limitation of back pain. No further concerns at this time.    History 04/22/2018: Patient is being seen today for one-year stroke follow-up visit.  He continues on Eliquis for secondary stroke prevention and atrial fibrillation without reported side effects of bleeding or bruising.  Continues on atorvastatin without side effects of myalgias.  Blood pressure today 142/83.  He does have chronic back pain with lumbar injection by Dr. Letta Pate with improvement of pain but does  continue to experience numbness bilaterally and difficulty ambulating.  He states he will walk a short distance and will experience lower extremity muscle fatigue/weakness where he feels like his legs will give out on him along with increased shortness of breath.  He is aware that weight loss is imperative for his overall health and ambulation.  He does endorse 5 to 10 pound weight loss in the month of January.  He does endorse limiting mobility/activity due to increased fear of falling with any type of increased activity.  He does continue to have gait imbalance post stroke and uses a cane around house or walker outdoors for assistance. He does endorse falling multiple times without injuries over the past year.  Weight loss has been encouraged at multiple provider appointments.  He does continue to be compliant with CPAP for OSA management.  No further concerns at this time.  Denies new or worsening stroke/TIA symptoms.   History summary: Visit 04/17/2017 PS : He returns for follow-up after last visit 6 months ago. He states his noticed some improvement in his walking and balance and now mostly uses a 4 pronged cane. He however did have a cough all around Thanksgiving when he fell up the stairs. He did injure his neck and upper back but now it seems to be improving. He was prescribed tramadol but it did not take it for long period he continues to have low back pain and he sees Dr. Read Drivers so plans to do spinal injection soon. He does stumble easily and even with a cane he feels his balance is not good and is not quite stable. Patient states that he does not like using  a walker and prefers a cane instead. He remains on aspirin and eliquis but states he has no significant cardiac history cardiac stents or cardiac surgery. States his blood pressure is well controlled today is 134/75. His tolerating Lipitor well without muscle aches and pains Initial visit 10/02/2016 PS: Mr. Vanwagner is a 73 year old Caucasian male  seen today for first office follow-up visit for in-hospital admission in March 2018 for stroke. He is accompanied by his wife. History is obtained from the patient, wife, review of electronic medical records and I have personally viewed imaging for lemons and lab results BOBIE KISTLER is a 73 y.o. male who was in his normal state of health until around 1:30 AM on 06/19/16.[name] He was in bed watching a movie when he had sudden onset left-sided head pain. He states that he then tried to stand up and found that he was leaning to the left. 911 was called and he was brought in as a code stroke. 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 There was a remote age pontine microhemorrhages noted. Transthoracic echo showed normal ejection fraction. LDL cholesterol was borderline at 77 (specimen C was 5.8. Patient was continued on his home aspirin 81 eliquis as he stated he had a bad experience on Xarelto did not want to switch to Pradaxa after discussing risks and benefits. Patient has been getting physical and occupational therapy  and noticing improvement. Diplopia has resolved. His voice is soft and hoarse yet. Still has some intermittent tingling numbnessc in his extremities. His balance is off. But getting better. Gets dizzy and off balance particularly when he moves quickly. He mostly uses a wheeled walker and has had no falls. He can walk short distances without a walker.  . ROS:   14 system review of systems is positive for pain and all other systems negative  PMH:  Past Medical History:  Diagnosis Date   Anemia 07/29/2013   Asthma    childhood asthma   Benign essential HTN    Benign prostatic hypertrophy    several prostate  biopsies neg for cancer.    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   FH: colonic polyps 2010   Gout    History of kidney stones    Hypertension    Kidney cysts    Morbid obesity (HCC)    Numbness    feet / legs   PAF (paroxysmal atrial fibrillation) with RVR 07/29/2013   Pericarditis 2015   s/p window   Recurrent pulmonary emboli (HCC) 06/13/2015   Sleep apnea    USES C PAP   Stroke syndrome    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    Social History:  Social History   Socioeconomic History   Marital status: Married    Spouse name: Not on file   Number of children: 1   Years of education: Not on  file   Highest education level: Not on file  Occupational History   Occupation: retired-Manager [employer] resource strain: Not very hard   Food insecurity    Worry: Never true    Inability: Never true   Transportation needs    Medical: No    Non-medical: No  Tobacco Use   Smoking status: Former Smoker    Packs/day: 0.20    Years: 13.00    Pack years: 2.60    Types: Cigarettes    Start date: 04/03/1961    Quit date: 08/30/1974    Years since quitting: 44.1   Smokeless tobacco: Never Used  Substance and Sexual Activity   Alcohol use: No    Alcohol/week: 0.0 standard drinks   Drug use: No   Sexual activity: Not Currently  Lifestyle   Physical activity    Days per week: 0 days    Minutes per session: 0 min   Stress: To some extent  Relationships   Social connections    Talks on phone: More than three times a week    Gets together: Once a week    Attends religious service: 1 to 4 times per  year    Active member of club or organization: Yes    Attends meetings of clubs or organizations: 1 to 4 times per year    Relationship status: Married   Intimate partner violence    Fear of current or ex partner: No    Emotionally abused: No    Physically abused: No    Forced sexual activity: No  Other Topics Concern   Not on file  Social History Narrative   Lives at home w/ his wife   Right-handed   Caffeine: decaf coffee, occasional tea    Medications:   Current Outpatient Medications on File Prior to Visit  Medication Sig Dispense Refill   atorvastatin (LIPITOR) 80 MG tablet TAKE 1 TABLET BY MOUTH EVERY DAY (Patient taking differently: Take 80 mg by mouth daily. ) 90 tablet 1   Cyanocobalamin (VITAMIN B-12 PO) Take 1 tablet by mouth at bedtime.     Docusate Sodium (DSS) 100 MG CAPS Take 100 mg by mouth every other day.     ELIQUIS 5 MG TABS tablet TAKE 1 TABLET BY MOUTH TWICE A DAY (Patient taking differently: Take 5 mg by mouth 2 (two) times daily. ) 180 tablet 1   finasteride (PROSCAR) 5 MG tablet Take 1 tablet (5 mg total) by mouth daily. 30 tablet 1   flecainide (TAMBOCOR) 50 MG tablet Take 1 tablet (50 mg total) by mouth every 12 (twelve) hours. OV NEEED 180 tablet 3   lidocaine (LIDODERM) 5 % Place 1 patch onto the skin daily. Remove & Discard patch within 12 hours or as directed by MD 12 patch 0   loratadine (CLARITIN) 10 MG tablet Take 10 mg by mouth daily.     LORazepam (ATIVAN) 0.5 MG tablet Take 1 tablet (0.5 mg total) by mouth at bedtime. 90 tablet 1   montelukast (SINGULAIR) 10 MG tablet TAKE 1 TABLET BY MOUTH EVERYDAY AT BEDTIME (Patient taking differently: Take 10 mg by mouth at bedtime. ) 90 tablet 1   OVER THE COUNTER MEDICATION Take 1 tablet by mouth daily. Multi vitamin with iron     oxyCODONE-acetaminophen (PERCOCET) 5-325 MG tablet Take 1 tablet by mouth every 4 (four) hours as needed. 20 tablet 0   pantoprazole (PROTONIX) 40 MG tablet Take 1  tablet (40  mg total) by mouth daily. 90 tablet 1   potassium chloride SA (KLOR-CON M20) 20 MEQ tablet Take 2 tablets (40 mEq total) by mouth daily. (Patient taking differently: Take 20 mEq by mouth See admin instructions. Take one table (20 meq) by mouth twice daily - afternoon and bedtime) 180 tablet 3   predniSONE (DELTASONE) 20 MG tablet Take 40 mg by mouth daily for 3 days, then 20mg  by mouth daily for 3 days, then 10mg  daily for 3 days 12 tablet 0   PRESCRIPTION MEDICATION Inhale into the lungs at bedtime. CPAP - setting 20     rOPINIRole (REQUIP) 0.5 MG tablet Take 1 tablet (0.5 mg total) by mouth 3 (three) times daily. (Patient taking differently: Take 1.5 mg by mouth at bedtime. ) 270 tablet 3   sertraline (ZOLOFT) 100 MG tablet Take 1.5 tablets (150 mg total) by mouth daily. (Patient taking differently: Take 150 mg by mouth at bedtime. ) 135 tablet 1   Tetrahydrozoline HCl (VISINE OP) Place 1 drop into both eyes daily as needed (dry eyes).     torsemide (DEMADEX) 20 MG tablet TAKE 3 TABLETS (60 MG TOTAL) BY MOUTH DAILY. KEEP APPOINTMENT AS SCHEDULED. 270 tablet 3   traMADol (ULTRAM) 50 MG tablet Take 1 tablet (50 mg total) by mouth every 8 (eight) hours as needed. 90 tablet 1   No current facility-administered medications on file prior to visit.     Allergies:   Allergies  Allergen Reactions   Morphine Nausea And Vomiting and Other (See Comments)    Severe hallucinations   Codeine Nausea Only   Penicillins Other (See Comments)    Passed out after injection Did it involve swelling of the face/tongue/throat, SOB, or low BP? Unknown Did it involve sudden or severe rash/hives, skin peeling, or any reaction on the inside of your mouth or nose? No Did you need to seek medical attention at a hospital or doctor's office? Yes When did it last happen?teenager If all above answers are NO, may proceed with cephalosporin use.   Propoxyphene N-Acetaminophen Nausea Only     Physical Exam General: Morbid obese elderly pleasant Caucasian male, seated, in no evident distress Head: head normocephalic and atraumatic.  Neck: supple with no carotid or supraclavicular bruits Cardiovascular: regular rate and rhythm, no murmurs Musculoskeletal: no deformity Skin:  no rash/petichiae Vascular:  Normal pulses all extremities Vitals:   10/21/18 1536  BP: (!) 167/91  Pulse: 75  Temp: (!) 97.5 F (36.4 C)   Neurologic Exam Mental Status: Awake and fully alert. Oriented to place and time. Recent and remote memory intact. Attention span, concentration and fund of knowledge appropriate. Mood and affect appropriate.  Cranial Nerves: Pupils equal, briskly reactive to light. Extraocular movements full without nystagmus. Visual fields full to confrontation. Hearing intact. Facial sensation intact. Face, tongue, palate moves normally and symmetrically.  Motor: Normal bulk and tone. Normal strength in all tested extremity muscles. Sensory.: Diminished left hemibody sensation to touch ,pinprick.Marland KitchenPosition and vibratory sensation diminished bilaterally distal lower extremities bilaterally Coordination: Rapid alternating movements normal in all extremities. Finger-to-nose performed accurately bilaterally but unable to perform heel-to-shin accurately. Gait and Station: Arises from chair with mild difficulty. Stance is broad-based. Gait demonstrates mild imbalance with use of Rollator walker.  Unsteady while standing on a narrow base and either leg unsupported.  Reflexes: 1+ and symmetric except both ankle jerks are absent and knee jerks are depressed.. Toes downgoing.     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 Multiple vascular risk factors of atrial fibrillation, obesity, diabetes, hypertension hyperlipidemia.   Recovered well from prior stroke without residual deficit.  Greatest complaint today is ongoing back pain which severely limits daily activity and ambulation.    PLAN: -Continue Eliquis and Lipitor for secondary stroke prevention -f/u with PCP for HTN and HLD management -Continue to follow with cardiology for atrial fibrillation and Eliquis management -Continue to follow with Dr. Dianna Limbo for ongoing back pain management -advised to speak with Dr. Dianna Limbo or PCP regarding referral to orthopedic surgeon or neurosurgeon for potential further procedures to help alleviate back pain. -Highly encouraged weight loss -Maintain strict control of hypertension with blood pressure goal below 130/90, diabetes with hemoglobin A1c goal below 6.5% and cholesterol with LDL cholesterol (bad cholesterol) goal below 70 mg/dL. I also advised the patient to eat a healthy diet with plenty of whole grains, cereals, fruits and vegetables, exercise regularly and maintain ideal body weight.  Stable from stroke standpoint and recommend follow-up as needed  Greater than 50% of time during this 30 minute visit was spent on counseling,explanation of diagnosis, planning of further management, discussion with patient and family and coordination of care  Venancio Poisson, [facility]  [facility] [address] [place], [place] [address]  Phone [phone] Fax [phone] Note: This document was prepared with digital dictation and possible smart phrase technology. Any transcriptional errors that result from this process are unintentional.

## 2018-10-22 NOTE — Progress Notes (Signed)
I agree with the above plan 

## 2018-10-23 ENCOUNTER — Other Ambulatory Visit: Payer: Self-pay | Admitting: Internal Medicine

## 2018-10-23 DIAGNOSIS — I480 Paroxysmal atrial fibrillation: Secondary | ICD-10-CM

## 2018-10-23 MED ORDER — APIXABAN 5 MG PO TABS: 5.0000 mg | ORAL_TABLET | Freq: Two times a day (BID) | ORAL | 1 refills | Status: DC

## 2018-10-24 ENCOUNTER — Other Ambulatory Visit: Payer: Self-pay

## 2018-10-24 ENCOUNTER — Encounter: Payer: Medicare Other | Attending: Physical Medicine & Rehabilitation | Admitting: Physical Medicine & Rehabilitation

## 2018-10-24 VITALS — BP 112/75 | HR 77 | Temp 97.7°F | Ht 72.0 in | Wt 383.0 lb

## 2018-10-24 DIAGNOSIS — R42 Dizziness and giddiness: Secondary | ICD-10-CM | POA: Insufficient documentation

## 2018-10-24 DIAGNOSIS — I110 Hypertensive heart disease with heart failure: Secondary | ICD-10-CM | POA: Diagnosis not present

## 2018-10-24 DIAGNOSIS — M25552 Pain in left hip: Secondary | ICD-10-CM | POA: Diagnosis not present

## 2018-10-24 DIAGNOSIS — Z8673 Personal history of transient ischemic attack (TIA), and cerebral infarction without residual deficits: Secondary | ICD-10-CM | POA: Diagnosis not present

## 2018-10-24 DIAGNOSIS — R531 Weakness: Secondary | ICD-10-CM | POA: Insufficient documentation

## 2018-10-24 DIAGNOSIS — M533 Sacrococcygeal disorders, not elsewhere classified: Secondary | ICD-10-CM | POA: Insufficient documentation

## 2018-10-24 DIAGNOSIS — Z79891 Long term (current) use of opiate analgesic: Secondary | ICD-10-CM

## 2018-10-24 DIAGNOSIS — Z8249 Family history of ischemic heart disease and other diseases of the circulatory system: Secondary | ICD-10-CM | POA: Diagnosis not present

## 2018-10-24 DIAGNOSIS — R0681 Apnea, not elsewhere classified: Secondary | ICD-10-CM | POA: Diagnosis not present

## 2018-10-24 DIAGNOSIS — R202 Paresthesia of skin: Secondary | ICD-10-CM | POA: Diagnosis not present

## 2018-10-24 DIAGNOSIS — M2550 Pain in unspecified joint: Secondary | ICD-10-CM | POA: Insufficient documentation

## 2018-10-24 DIAGNOSIS — R200 Anesthesia of skin: Secondary | ICD-10-CM | POA: Diagnosis not present

## 2018-10-24 DIAGNOSIS — M7989 Other specified soft tissue disorders: Secondary | ICD-10-CM | POA: Diagnosis not present

## 2018-10-24 DIAGNOSIS — M545 Low back pain: Secondary | ICD-10-CM | POA: Diagnosis not present

## 2018-10-24 DIAGNOSIS — M25551 Pain in right hip: Secondary | ICD-10-CM | POA: Insufficient documentation

## 2018-10-24 DIAGNOSIS — G463 Brain stem stroke syndrome: Secondary | ICD-10-CM | POA: Diagnosis not present

## 2018-10-24 DIAGNOSIS — Z5181 Encounter for therapeutic drug level monitoring: Secondary | ICD-10-CM | POA: Diagnosis not present

## 2018-10-24 DIAGNOSIS — R062 Wheezing: Secondary | ICD-10-CM | POA: Diagnosis not present

## 2018-10-24 DIAGNOSIS — Z87891 Personal history of nicotine dependence: Secondary | ICD-10-CM | POA: Diagnosis not present

## 2018-10-24 DIAGNOSIS — M47817 Spondylosis without myelopathy or radiculopathy, lumbosacral region: Secondary | ICD-10-CM

## 2018-10-24 DIAGNOSIS — G894 Chronic pain syndrome: Secondary | ICD-10-CM

## 2018-10-24 DIAGNOSIS — G8929 Other chronic pain: Secondary | ICD-10-CM | POA: Insufficient documentation

## 2018-10-24 DIAGNOSIS — R0602 Shortness of breath: Secondary | ICD-10-CM | POA: Insufficient documentation

## 2018-10-24 DIAGNOSIS — I5032 Chronic diastolic (congestive) heart failure: Secondary | ICD-10-CM | POA: Insufficient documentation

## 2018-10-24 DIAGNOSIS — R262 Difficulty in walking, not elsewhere classified: Secondary | ICD-10-CM | POA: Diagnosis not present

## 2018-10-24 NOTE — Progress Notes (Signed)
  PROCEDURE RECORD [facility]   Name: [name] DOB:24-Sep-1945 MRN: [id number]  Date:10/24/2018  Physician: Alysia Penna, MD    Nurse/CMA: [name], CMA  Allergies:  Allergies  Allergen Reactions  . Morphine Nausea And Vomiting and Other (See Comments)    Severe hallucinations  . Codeine Nausea Only  . Penicillins Other (See Comments)    Passed out after injection Did it involve swelling of the face/tongue/throat, SOB, or low BP? Unknown Did it involve sudden or severe rash/hives, skin peeling, or any reaction on the inside of your mouth or nose? No Did you need to seek medical attention at a hospital or doctor's office? Yes When did it last happen?teenager If all above answers are "NO", may proceed with cephalosporin use.  [name] Propoxyphene N-Acetaminophen Nausea Only    Consent Signed: Yes.    Is patient diabetic? No.  CBG today?   Pregnant: No. LMP: No LMP for male patient. (age 21-55)  Anticoagulants: yes (Eliquis(did not stop for procedure)) Anti-inflammatory: no Antibiotics: yes (prednisone dosepak)  Procedure: Right L3-4-5 Medial Branch Block Position: Prone Start Time: 1:45pm End Time: 1:51pm Fluoro Time: 32  RN/CMA [name], CMA Kol Consuegra, CMA    Time 1:27pm 1:58pm    BP 112/75 134/78    Pulse 77 75    Respirations 14 14    O2 Sat 93 94    S/S 6 6    Pain Level 7/10      D/C home with wife, patient A & O X 3, D/C instructions reviewed, and sits independently.

## 2018-10-24 NOTE — Progress Notes (Signed)
Right lumbar L3, L4 medial branch blocks and L5 dorsal ramus injection under fluoroscopic guidance  Indication: Right Lumbar pain which is not relieved by medication management or other conservative care and interfering with self-care and mobility.  Informed consent was obtained after describing risks and benefits of the procedure with the patient, this includes bleeding, bruising, infection, paralysis and medication side effects. The patient wishes to proceed and has given written consent. The patient was placed in a prone position. The lumbar area was marked and prepped with Betadine. One ML of 1% lidocaine was injected into each of 3 areas into the skin and subcutaneous tissue. Then a 22-gauge 5in  spinal needle was inserted targeting the junction of the Right S1 superior articular process and sacral ala junction. Needle was advanced under fluoroscopic guidance. Bone contact was made.Isovue 200 was injected x0.5 mL demonstrating no intravascular uptake. Then a solution containing 2% MPF lidocaine was injected x0.5 mL. Then the Right L5 superior articular process in transverse process junction was targeted. Bone contact was made.Isovue 200 was injected x0.5 mL demonstrating no intravascular uptake. Then a solution containing 2% MPF lidocaine was injected x0.5 mL. Then the Right L4 superior articular process in transverse process junction was targeted. Bone contact was made. Isovue 200 was injected x0.5 mL demonstrating no intravascular uptake. Then a solution containing2% MPF lidocaine was injected x0.5 mL Patient tolerated procedure well. Post procedure instructions were given. Please refer to post procedure form.

## 2018-10-24 NOTE — Patient Instructions (Signed)

## 2018-10-25 ENCOUNTER — Other Ambulatory Visit (HOSPITAL_COMMUNITY): Payer: Self-pay | Admitting: Psychiatry

## 2018-10-25 DIAGNOSIS — F411 Generalized anxiety disorder: Secondary | ICD-10-CM

## 2018-10-25 DIAGNOSIS — F330 Major depressive disorder, recurrent, mild: Secondary | ICD-10-CM

## 2018-10-26 ENCOUNTER — Other Ambulatory Visit: Payer: Self-pay | Admitting: Internal Medicine

## 2018-10-26 DIAGNOSIS — J309 Allergic rhinitis, unspecified: Secondary | ICD-10-CM

## 2018-10-26 DIAGNOSIS — H1010 Acute atopic conjunctivitis, unspecified eye: Secondary | ICD-10-CM

## 2018-10-26 MED ORDER — MONTELUKAST SODIUM 10 MG PO TABS: 10.0000 mg | ORAL_TABLET | Freq: Every day | ORAL | 1 refills | Status: DC

## 2018-10-29 ENCOUNTER — Other Ambulatory Visit: Payer: Self-pay | Admitting: Physician Assistant

## 2018-10-29 LAB — DRUG TOX MONITOR 1 W/CONF, ORAL FLD
Amphetamines: NEGATIVE ng/mL (ref <10)
Barbiturates: NEGATIVE ng/mL (ref <10)
Benzodiazepines: NEGATIVE ng/mL (ref <0.50)
Buprenorphine: NEGATIVE ng/mL (ref <0.10)
Cocaine: NEGATIVE ng/mL (ref <5.0)
Fentanyl: NEGATIVE ng/mL (ref <0.10)
Heroin Metabolite: NEGATIVE ng/mL (ref <1.0)
MARIJUANA: NEGATIVE ng/mL (ref <2.5)
MDMA: NEGATIVE ng/mL (ref <10)
Meprobamate: NEGATIVE ng/mL (ref <2.5)
Methadone: NEGATIVE ng/mL (ref <5.0)
Nicotine Metabolite: NEGATIVE ng/mL (ref <5.0)
Opiates: NEGATIVE ng/mL (ref <2.5)
Phencyclidine: NEGATIVE ng/mL (ref <10)
Tapentadol: NEGATIVE ng/mL (ref <5.0)
Tramadol: >500 ng/mL — ABNORMAL HIGH (ref <5.0)
Tramadol: POSITIVE ng/mL — AB (ref <5.0)
Zolpidem: NEGATIVE ng/mL (ref <5.0)

## 2018-10-29 LAB — DRUG TOX ALC METAB W/CON, ORAL FLD: Alcohol Metabolite: NEGATIVE ng/mL (ref <25)

## 2018-10-30 ENCOUNTER — Telehealth: Payer: Self-pay | Admitting: *Deleted

## 2018-10-30 NOTE — Telephone Encounter (Signed)
Oral swab drug screen was consistent for prescribed medications.  ?

## 2018-11-01 ENCOUNTER — Other Ambulatory Visit: Payer: Self-pay | Admitting: Internal Medicine

## 2018-11-01 DIAGNOSIS — E785 Hyperlipidemia, unspecified: Secondary | ICD-10-CM

## 2018-11-01 MED ORDER — ATORVASTATIN CALCIUM 80 MG PO TABS: 80.0000 mg | ORAL_TABLET | Freq: Every day | ORAL | 1 refills | Status: DC

## 2018-11-11 ENCOUNTER — Telehealth: Payer: Self-pay

## 2018-11-11 NOTE — Telephone Encounter (Signed)
Week: 1  Progress Notes: Pt is having trouble understanding food, says he is without a "clear goal" Pt mentioned wanting to start the "Zone Diet". Says his brother has had a lot of success with it. Informed pt that I would mention this to his provider for more information. His overall wellness goal is to be able to walk to his mailbox and back without being out of breath.   Challenges: Pt has limited mobility and has a lack of motivation.   Opportunities:Pt would like to start back tracking his meals with the My[employer]itness[employer] app. Ideal weight is 215. Pt would like to continue with weekly phone sessions. Would like weekly goals emailed to him, as MyChart gives him issues.  Client Commitment/Agreement for Next Session: Next call is scheduled for 8/17. Pt agreed to read a chapter of the Zone Diet book, as well as track his meals in MyFitnessPal for 7 days.

## 2018-11-13 ENCOUNTER — Encounter (HOSPITAL_COMMUNITY): Payer: Self-pay | Admitting: Psychiatry

## 2018-11-13 ENCOUNTER — Ambulatory Visit (INDEPENDENT_AMBULATORY_CARE_PROVIDER_SITE_OTHER): Payer: Medicare Other | Admitting: Psychiatry

## 2018-11-13 ENCOUNTER — Other Ambulatory Visit: Payer: Self-pay

## 2018-11-13 DIAGNOSIS — F411 Generalized anxiety disorder: Secondary | ICD-10-CM

## 2018-11-13 DIAGNOSIS — G463 Brain stem stroke syndrome: Secondary | ICD-10-CM

## 2018-11-13 DIAGNOSIS — F330 Major depressive disorder, recurrent, mild: Secondary | ICD-10-CM

## 2018-11-13 MED ORDER — LORAZEPAM 0.5 MG PO TABS: 0.5000 mg | ORAL_TABLET | Freq: Every day | ORAL | 1 refills | Status: DC

## 2018-11-13 MED ORDER — SERTRALINE HCL 100 MG PO TABS: 150.0000 mg | ORAL_TABLET | Freq: Every day | ORAL | 1 refills | Status: DC

## 2018-11-13 NOTE — Progress Notes (Signed)
Virtual Visit via Telephone Note  I connected with [name] on 11/13/18 at  2:00 PM EDT by telephone and verified that I am speaking with the correct person using two identifiers.   I discussed the limitations, risks, security and privacy concerns of performing an evaluation and management service by telephone and the availability of in person appointments. I also discussed with the patient that there may be a patient responsible charge related to this service. The patient expressed understanding and agreed to proceed.   History of Present Illness: Patient was evaluated through phone session.  He has been lately feeling more tired with lack of energy.  Recently he had a back pain and he was given pain medicine.  Sometime he has no motivation to do things because of his weight, chronic back pain.  However he feels the current medicine is working and denies any crying spells, any feeling of hopelessness or worthlessness.  He enjoys his 73-1/2 years old grand daughter and see her very frequently.  His son is also very busy these days and looking for a land for hunting.  He is sleeping 5 to 6 hours.  Denies any suicidal thoughts or homicidal thought.  He lives with his wife who is very supportive.  He denies drinking or using any illegal substances.  He is pleased that he is able to drive but lately he has cut down his driving due to [place] and feeling tired.  Recently he had a blood work.  His creatinine is a stable.  He has no tremors, shakes or any EPS.  Denies any major panic attack.   Past Psychiatric History: Reviewed H/O one psychiatric admission in 2006 for depression and having suicidal thoughts. Took Risperdal but discontinued after the stroke in March 2018.   Recent Results (from the past 2160 hour(s))  CBC with Differential     Status: None   Collection Time: 10/14/18  2:09 AM  Result Value Ref Range   WBC 9.7 4.0 - 10.5 K/uL   RBC 4.79 4.22 - 5.81 MIL/uL   Hemoglobin 13.9 13.0 -  17.0 g/dL   HCT 45.3 39.0 - 52.0 %   MCV 94.6 80.0 - 100.0 fL   MCH 29.0 26.0 - 34.0 pg   MCHC 30.7 30.0 - 36.0 g/dL   RDW 13.6 11.5 - 15.5 %   Platelets 212 150 - 400 K/uL   nRBC 0.0 0.0 - 0.2 %   Neutrophils Relative % 64 %   Neutro Abs 6.3 1.7 - 7.7 K/uL   Lymphocytes Relative 22 %   Lymphs Abs 2.1 0.7 - 4.0 K/uL   Monocytes Relative 10 %   Monocytes Absolute 1.0 0.1 - 1.0 K/uL   Eosinophils Relative 2 %   Eosinophils Absolute 0.2 0.0 - 0.5 K/uL   Basophils Relative 1 %   Basophils Absolute 0.1 0.0 - 0.1 K/uL   Immature Granulocytes 1 %   Abs Immature Granulocytes 0.05 0.00 - 0.07 K/uL    Comment: Performed at [facility], [address] [place], [place] [address]  Basic metabolic panel     Status: Abnormal   Collection Time: 10/14/18  2:09 AM  Result Value Ref Range   Sodium 140 135 - 145 mmol/L   Potassium 3.5 3.5 - 5.1 mmol/L   Chloride 101 98 - 111 mmol/L   CO2 29 22 - 32 mmol/L   Glucose, Bld 124 (H) 70 - 99 mg/dL   BUN 21 8 - 23 mg/dL  Creatinine, Ser 1.30 (H) 0.61 - 1.24 mg/dL   Calcium 8.1 (L) 8.9 - 10.3 mg/dL   GFR calc non Af Amer 55 (L) >60 mL/min   GFR calc Af Amer >60 >60 mL/min   Anion gap 10 5 - 15    Comment: Performed at [facility] [address] [place], [place] [address]  Drug Tox Alc Metab w/Con, Oral Fld     Status: None   Collection Time: 10/24/18  1:59 PM  Result Value Ref Range   Alcohol Metabolite NEGATIVE <25 ng/mL    Comment: . For additional information, please refer to http://education.questdiagnostics.com/faq/FAQ183 (This link is being provided for informational/ educational purposes only.) . This drug testing is for medical treatment only. Analysis was performed as non-forensic testing and these results should be used only by healthcare providers to render diagnosis or treatment, or to monitor progress of medical conditions. . For assistance with interpreting these drug results, please contact a [employer] Toxicology Specialist: [phone] [place] [phone]), M-F, 8am-6pm EST. [name] These tests were developed and their analytical performance characteristics have been determined by [facility]. They have not been cleared or approved by the FDA. These assays have been validated pursuant to the CLIA regulations and are used for clinical purposes.   Drug Tox Monitor 1 w/Conf, Oral Fld     Status: Abnormal   Collection Time: 10/24/18  1:59 PM  Result Value Ref Range   Amphetamines NEGATIVE <10 ng/mL   Barbiturates NEGATIVE <10 ng/mL   Benzodiazepines NEGATIVE <0.50 ng/mL   Buprenorphine NEGATIVE <0.10 ng/mL   Cocaine NEGATIVE <5.0 ng/mL   Fentanyl NEGATIVE <0.10 ng/mL   Heroin Metabolite NEGATIVE <1.0 ng/mL   MARIJUANA NEGATIVE <2.5 ng/mL   MDMA NEGATIVE <10 ng/mL   Meprobamate NEGATIVE <2.5 ng/mL   Methadone NEGATIVE <5.0 ng/mL   Nicotine Metabolite NEGATIVE <5.0 ng/mL   Opiates NEGATIVE <2.5 ng/mL   Phencyclidine NEGATIVE <10 ng/mL   Tapentadol NEGATIVE <5.0 ng/mL   Tramadol POSITIVE (A) <5.0 ng/mL   Tramadol >500.0 (H) <5.0 ng/mL   Zolpidem NEGATIVE <5.0 ng/mL    Comment: . For additional information, please refer to http://education.[facility].com/faq/FAQ186 (This link is being provided for informational/ educational purposes only.) . This drug testing is for medical treatment only. Analysis was performed as non-forensic testing and these results should be used only by healthcare providers to render diagnosis or treatment, or to monitor progress of medical conditions. . For assistance with interpreting these drug results, please contact a [employer] Toxicology Specialist: [phone] [place] [phone]), M-F, 8am-6pm EST. [name] These tests were developed and their analytical performance characteristics have been determined by [facility]. They have not been cleared or approved by the FDA. These assays have been validated pursuant to  the CLIA regulations and are used for clinical purposes.      Psychiatric Specialty Exam: Physical Exam  ROS  There were no vitals taken for this visit.There is no height or weight on file to calculate BMI.  General Appearance: NA  Eye Contact:  NA  Speech:  Clear and Coherent and Normal Rate  Volume:  Normal  Mood:  Euthymic  Affect:  NA  Thought Process:  Goal Directed  Orientation:  Full (Time, Place, and Person)  Thought Content:  Logical  Suicidal Thoughts:  No  Homicidal Thoughts:  No  Memory:  Immediate;   Good Recent;   Fair Remote;   Good  Judgement:  Good  Insight:  Present  Psychomotor Activity:  NA  Concentration:  Concentration: Good and Attention Span: Good  Recall:  Good  Fund of Knowledge:  Good  Language:  Good  Akathisia:  No  Handed:  Right  AIMS (if indicated):     Assets:  Communication Skills Desire for Improvement Housing Resilience Social Support  ADL's:  Intact  Cognition:  WNL  Sleep:       Assessment and Plan: Major depressive disorder, recurrent.  Generalized anxiety disorder.  I reviewed his blood work results and collateral formation.  His creatinine is stable.  He does not want to change medicine because he feels it is working very well.  He is not interested in therapy.  Continue Zoloft 150 mg at bedtime and lorazepam 0.5 mg at bedtime.  He has no tremors, shakes or any EPS.  Discussed medication side effects and benefits.  Recommended to call us back if he has any question or any concern.  Follow-up in 6 months.  Follow Up Instructions:    I discussed the assessment and treatment plan with the patient. The patient was provided an opportunity to ask questions and all were answered. The patient agreed with the plan and demonstrated an understanding of the instructions.   The patient was advised to call back or seek an in-person evaluation if the symptoms worsen or if the condition fails to improve as anticipated.  I provided 20  minutes of non-face-to-face time during this encounter.   Kathlee Nations, MD

## 2018-11-14 ENCOUNTER — Ambulatory Visit (INDEPENDENT_AMBULATORY_CARE_PROVIDER_SITE_OTHER): Payer: Medicare Other | Admitting: Internal Medicine

## 2018-11-14 ENCOUNTER — Ambulatory Visit (INDEPENDENT_AMBULATORY_CARE_PROVIDER_SITE_OTHER)
Admission: RE | Admit: 2018-11-14 | Discharge: 2018-11-14 | Disposition: A | Discharge status: Home / Self Care | Payer: Medicare Other | Source: Ambulatory Visit | Attending: Internal Medicine | Admitting: Internal Medicine

## 2018-11-14 ENCOUNTER — Other Ambulatory Visit: Payer: Self-pay

## 2018-11-14 ENCOUNTER — Encounter: Payer: Self-pay | Admitting: Internal Medicine

## 2018-11-14 VITALS — BP 150/70 | HR 75 | Temp 98.0°F | Resp 16 | Ht 72.0 in | Wt 384.0 lb

## 2018-11-14 DIAGNOSIS — M79672 Pain in left foot: Secondary | ICD-10-CM | POA: Diagnosis not present

## 2018-11-14 DIAGNOSIS — R7303 Prediabetes: Secondary | ICD-10-CM | POA: Diagnosis not present

## 2018-11-14 DIAGNOSIS — M722 Plantar fascial fibromatosis: Secondary | ICD-10-CM

## 2018-11-14 DIAGNOSIS — I10 Essential (primary) hypertension: Secondary | ICD-10-CM | POA: Diagnosis not present

## 2018-11-14 DIAGNOSIS — E118 Type 2 diabetes mellitus with unspecified complications: Secondary | ICD-10-CM | POA: Diagnosis not present

## 2018-11-14 DIAGNOSIS — G463 Brain stem stroke syndrome: Secondary | ICD-10-CM

## 2018-11-14 DIAGNOSIS — M19072 Primary osteoarthritis, left ankle and foot: Secondary | ICD-10-CM | POA: Diagnosis not present

## 2018-11-14 LAB — POCT GLYCOSYLATED HEMOGLOBIN (HGB A1C): Hemoglobin A1C: 6.5 % — AB (ref 4.0–5.6)

## 2018-11-14 NOTE — Progress Notes (Signed)
Subjective:  Patient ID: [name], male    DOB: 13-Dec-1945  Age: 73 y.o. MRN: [id number]  CC: Foot Pain and Diabetes   HPI [name] presents for f/up - He complains of a one-week history of nontraumatic pain on the bottom of his left foot.  Outpatient Medications Prior to Visit  Medication Sig Dispense Refill   apixaban (ELIQUIS) 5 MG TABS tablet Take 1 tablet (5 mg total) by mouth 2 (two) times daily. 180 tablet 1   atorvastatin (LIPITOR) 80 MG tablet Take 1 tablet (80 mg total) by mouth daily. 90 tablet 1   Cyanocobalamin (VITAMIN B-12 PO) Take 1 tablet by mouth at bedtime.     Docusate Sodium (DSS) 100 MG CAPS Take 100 mg by mouth every other day.     finasteride (PROSCAR) 5 MG tablet Take 1 tablet (5 mg total) by mouth daily. 30 tablet 1   flecainide (TAMBOCOR) 50 MG tablet Take 1 tablet (50 mg total) by mouth every 12 (twelve) hours. OV NEEED 180 tablet 3   lidocaine (LIDODERM) 5 % Place 1 patch onto the skin daily. Remove & Discard patch within 12 hours or as directed by MD 12 patch 0   loratadine (CLARITIN) 10 MG tablet Take 10 mg by mouth daily.     LORazepam (ATIVAN) 0.5 MG tablet Take 1 tablet (0.5 mg total) by mouth at bedtime. 90 tablet 1   montelukast (SINGULAIR) 10 MG tablet Take 1 tablet (10 mg total) by mouth at bedtime. 90 tablet 1   OVER THE COUNTER MEDICATION Take 1 tablet by mouth daily. Multi vitamin with iron     pantoprazole (PROTONIX) 40 MG tablet Take 1 tablet (40 mg total) by mouth daily. 90 tablet 1   potassium chloride SA (K-DUR) 20 MEQ tablet Take 1 tablet (20 mEq total) by mouth 2 (two) times daily. Take one table (20 meq) by mouth twice daily - afternoon and bedtime 60 tablet 5   PRESCRIPTION MEDICATION Inhale into the lungs at bedtime. CPAP - setting 20     rOPINIRole (REQUIP) 0.5 MG tablet Take 1 tablet (0.5 mg total) by mouth 3 (three) times daily. (Patient taking differently: Take 1.5 mg by mouth at bedtime. ) 270 tablet 3    sertraline (ZOLOFT) 100 MG tablet Take 1.5 tablets (150 mg total) by mouth at bedtime. 135 tablet 1   torsemide (DEMADEX) 20 MG tablet TAKE 3 TABLETS (60 MG TOTAL) BY MOUTH DAILY. KEEP APPOINTMENT AS SCHEDULED. 270 tablet 3   traMADol (ULTRAM) 50 MG tablet Take 1 tablet (50 mg total) by mouth every 8 (eight) hours as needed. 90 tablet 1   No facility-administered medications prior to visit.     ROS Review of Systems  Constitutional: Negative.  Negative for diaphoresis, fatigue and unexpected weight change.  HENT: Negative.   Eyes: Negative for visual disturbance.  Respiratory: Negative for cough, chest tightness, shortness of breath and wheezing.   Cardiovascular: Negative for chest pain, palpitations and leg swelling.  Gastrointestinal: Negative for abdominal pain, constipation, diarrhea, nausea and vomiting.  Endocrine: Negative.   Genitourinary: Negative.  Negative for difficulty urinating.  Musculoskeletal: Positive for arthralgias. Negative for myalgias and neck pain.  Skin: Negative.  Negative for color change.  Neurological: Positive for dizziness.  Hematological: Negative for adenopathy. Does not bruise/bleed easily.  Psychiatric/Behavioral: Negative.     Objective:  BP (!) 150/70 (BP Location: Left Arm, Patient Position: Sitting, Cuff Size: Large)    Pulse 75  Temp 98 F (36.7 C) (Oral)    Resp 16    Ht 6' (1.829 m)    Wt (!) 384 lb (174.2 kg)    SpO2 98%    BMI 52.08 kg/m   BP Readings from Last 3 Encounters:  11/14/18 (!) 150/70  10/24/18 112/75  10/21/18 (!) 167/91    Wt Readings from Last 3 Encounters:  11/14/18 (!) 384 lb (174.2 kg)  10/24/18 (!) 383 lb (173.7 kg)  10/21/18 (!) 387 lb 12.8 oz (175.9 kg)    Physical Exam Vitals signs reviewed.  Constitutional:      Appearance: He is obese. He is not ill-appearing or diaphoretic.  HENT:     Nose: Nose normal.     Mouth/Throat:     Mouth: Mucous membranes are moist.  Eyes:     General: No scleral  icterus.    Conjunctiva/sclera: Conjunctivae normal.  Neck:     Musculoskeletal: Normal range of motion. No neck rigidity.  Cardiovascular:     Rate and Rhythm: Normal rate and regular rhythm.     Pulses:          Dorsalis pedis pulses are 1+ on the right side and 1+ on the left side.       Posterior tibial pulses are 1+ on the right side and 1+ on the left side.     Heart sounds: No murmur.  Pulmonary:     Effort: Pulmonary effort is normal. No respiratory distress.     Breath sounds: No stridor. No wheezing, rhonchi or rales.  Abdominal:     General: Abdomen is protuberant.     Palpations: There is no hepatomegaly or splenomegaly.     Tenderness: There is no abdominal tenderness.  Musculoskeletal: Normal range of motion.        General: No swelling.     Right lower leg: No edema.     Left lower leg: No edema.     Right foot: Normal range of motion and normal capillary refill. No tenderness, bony tenderness, swelling, crepitus, deformity or bunion.     Left foot: Normal. Normal range of motion and normal capillary refill. No tenderness, bony tenderness, swelling, crepitus, deformity or bunion.  Feet:     Right foot:     Skin integrity: Erythema present. No ulcer, blister, skin breakdown, warmth, callus, dry skin or fissure.     Toenail Condition: Right toenails are long.     Left foot:     Skin integrity: Erythema present. No ulcer, blister, skin breakdown, warmth, callus, dry skin or fissure.     Toenail Condition: Left toenails are long.  Skin:    General: Skin is warm and dry.     Coloration: Skin is not pale.  Neurological:     General: No focal deficit present.     Mental Status: He is alert.     Lab Results  Component Value Date   WBC 9.7 10/14/2018   HGB 13.9 10/14/2018   HCT 45.3 10/14/2018   PLT 212 10/14/2018   GLUCOSE 124 (H) 10/14/2018   CHOL 139 05/16/2018   TRIG 203.0 (H) 05/16/2018   HDL 29.70 (L) 05/16/2018   LDLDIRECT 91.0 05/16/2018   LDLCALC 77  06/19/2016   ALT 17 05/16/2018   AST 21 05/16/2018   NA 140 10/14/2018   K 3.5 10/14/2018   CL 101 10/14/2018   CREATININE 1.30 (H) 10/14/2018   BUN 21 10/14/2018   CO2 29 10/14/2018   TSH 2.04  05/16/2018   PSA 0.68 12/24/2015   INR 1.09 06/19/2016   HGBA1C 6.5 (A) 11/14/2018   MICROALBUR 1.1 12/12/2006    Mr Lumbar Spine Wo Contrast  Result Date: 10/14/2018 CLINICAL DATA:  73 year old male with increased chronic back pain, right side pain, incontinence. EXAM: MRI LUMBAR SPINE WITHOUT CONTRAST TECHNIQUE: Multiplanar, multisequence MR imaging of the lumbar spine was performed. No intravenous contrast was administered. COMPARISON:  Lumbar radiographs 11/07/2016. FINDINGS: Segmentation:  Normal on the comparison radiographs. Alignment: Mild retrolisthesis of L4 on L5 measures up to 5 millimeters and is new from the prior radiographs. Stable straightening of lumbar lordosis otherwise. Vertebrae: Benign vertebral body hemangioma in L4. No marrow edema or evidence of acute osseous abnormality. Normal background bone marrow signal. Intact visible sacrum and SI joints. Conus medullaris and cauda equina: Conus extends to the L1 level. No lower spinal cord or conus signal abnormality. Lipoma of the filum terminalis, normal variant (series 9, image 11). Paraspinal and other soft tissues: Partially visible benign appearing renal cysts. Negative visible other abdominal viscera. Negative visualized posterior paraspinal soft tissues. Disc levels: T11-T12: Partially visible, negative. T12-L1:  Minimal disc bulge. L1-L2:  Negative. L2-L3:  Minimal disc bulge.  Mild facet hypertrophy.  No stenosis. L3-L4: Mostly far lateral mild disc bulging with endplate spurring. Mild to moderate facet hypertrophy. No spinal or lateral recess stenosis. Borderline to mild bilateral L3 foraminal stenosis. L4-L5: Chronic disc space loss. Retrolisthesis with circumferential disc osteophyte complex. Mild to moderate facet hypertrophy.  No significant spinal stenosis. Mild bilateral lateral recess stenosis (descending L5 nerve levels). Superimposed left foraminal to far lateral disc protrusion (series 5, image 14). Moderate to severe left and mild to moderate right L4 foraminal stenosis. L5-S1: Negative disc. Mild to moderate facet hypertrophy. No stenosis. IMPRESSION: 1. L4-L5 advanced disc and endplate degeneration, and moderate posterior element degeneration where mild retrolisthesis appears increased since 2018. Moderate to severe L4 foraminal stenosis is greater on the left. Mild bilateral L5 nerve level lateral recess stenosis. 2. Mild for age lumbar spine degeneration elsewhere with no other convincing neural impingement. Electronically Signed   By: Genevie Ann M.D.   On: 10/14/2018 03:29   Dg Foot Complete Left  Result Date: 11/15/2018 CLINICAL DATA:  Left foot pain for 5 days.  No known injury. EXAM: LEFT FOOT - COMPLETE 3+ VIEW COMPARISON:  None. FINDINGS: The mineralization and alignment are normal. There is no evidence of acute fracture or dislocation. There are mild degenerative changes at the 1st metatarsophalangeal joint. Possible posttraumatic deformity of the head of the 5th proximal phalanx. Mild midfoot and tibiotalar degenerative changes. There are small posterior and plantar calcaneal spurs. There may be mild soft tissue swelling in the dorsum of the forefoot, but no foreign body. IMPRESSION: Degenerative changes as described with possible soft tissue swelling in the dorsal forefoot. No acute osseous findings. Electronically Signed   By: Richardean Sale M.D.   On: 11/15/2018 10:54     Assessment & Plan:   Estefan was seen today for foot pain and diabetes.  Diagnoses and all orders for this visit:  Benign essential HTN- His blood pressure is adequately well controlled.  Prediabetes  Acute foot pain, left- Symptoms, exam, and plain films are reassuring.  I think this is plantar fasciitis. I have asked him to see  podiatry to see if he would benefit from a steroid injection. -     DG Foot Complete Left; Future  Plantar fasciitis of left foot -  Ambulatory referral to Podiatry  Type II diabetes mellitus with manifestations ([place])- His A1c is up to 6.5%.  He has new onset type 2 diabetes mellitus.  This does not require medical intervention.  He agrees to work on his lifestyle modifications. -     HM Diabetes Foot Exam -     POCT glycosylated hemoglobin (Hb A1C)   I am having [name] Faron "[name]" maintain his DSS, loratadine, finasteride, torsemide, pantoprazole, rOPINIRole, Cyanocobalamin (VITAMIN B-12 PO), OVER THE COUNTER MEDICATION, PRESCRIPTION MEDICATION, lidocaine, flecainide, traMADol, apixaban, montelukast, potassium chloride SA, atorvastatin, LORazepam, and sertraline.  No orders of the defined types were placed in this encounter.    Follow-up: Return if symptoms worsen or fail to improve.  [name], MD

## 2018-11-14 NOTE — Patient Instructions (Signed)
Plantar Fasciitis  Plantar fasciitis is a painful foot condition that affects the heel. It occurs when the band of tissue that connects the toes to the heel bone (plantar fascia) becomes irritated. This can happen as the result of exercising too much or doing other repetitive activities (overuse injury). The pain from plantar fasciitis can range from mild irritation to severe pain that makes it difficult to walk or move. The pain is usually worse in the morning after sleeping, or after sitting or lying down for a while. Pain may also be worse after long periods of walking or standing. What are the causes? This condition may be caused by:  Standing for long periods of time.  Wearing shoes that do not have good arch support.  Doing activities that put stress on joints (high-impact activities), including running, aerobics, and ballet.  Being overweight.  An abnormal way of walking (gait).  Tight muscles in the back of your lower leg (calf).  High arches in your feet.  Starting a new athletic activity. What are the signs or symptoms? The main symptom of this condition is heel pain. Pain may:  Be worse with first steps after a time of rest, especially in the morning after sleeping or after you have been sitting or lying down for a while.  Be worse after long periods of standing still.  Decrease after 30-45 minutes of activity, such as gentle walking. How is this diagnosed? This condition may be diagnosed based on your medical history and your symptoms. Your health care provider may ask questions about your activity level. Your health care provider will do a physical exam to check for:  A tender area on the bottom of your foot.  A high arch in your foot.  Pain when you move your foot.  Difficulty moving your foot. You may have imaging tests to confirm the diagnosis, such as:  X-rays.  Ultrasound.  MRI. How is this treated? Treatment for plantar fasciitis depends on how  severe your condition is. Treatment may include:  Rest, ice, applying pressure (compression), and raising the affected foot (elevation). This may be called RICE therapy. Your health care provider may recommend RICE therapy along with over-the-counter pain medicines to manage your pain.  Exercises to stretch your calves and your plantar fascia.  A splint that holds your foot in a stretched, upward position while you sleep (night splint).  Physical therapy to relieve symptoms and prevent problems in the future.  Injections of steroid medicine (cortisone) to relieve pain and inflammation.  Stimulating your plantar fascia with electrical impulses (extracorporeal shock wave therapy). This is usually the last treatment option before surgery.  Surgery, if other treatments have not worked after 12 months. Follow these instructions at home:  Managing pain, stiffness, and swelling  If directed, put ice on the painful area: ? Put ice in a plastic bag, or use a frozen bottle of water. ? Place a towel between your skin and the bag or bottle. ? Roll the bottom of your foot over the bag or bottle. ? Do this for 20 minutes, 2-3 times a day.  Wear athletic shoes that have air-sole or gel-sole cushions, or try wearing soft shoe inserts that are designed for plantar fasciitis.  Raise (elevate) your foot above the level of your heart while you are sitting or lying down. Activity  Avoid activities that cause pain. Ask your health care provider what activities are safe for you.  Do physical therapy exercises and stretches as told   by your health care provider.  Try activities and forms of exercise that are easier on your joints (low-impact). Examples include swimming, water aerobics, and biking. General instructions  Take over-the-counter and prescription medicines only as told by your health care provider.  Wear a night splint while sleeping, if told by your health care provider. Loosen the splint  if your toes tingle, become numb, or turn cold and blue.  Maintain a healthy weight, or work with your health care provider to lose weight as needed.  Keep all follow-up visits as told by your health care provider. This is important. Contact a health care provider if you:  Have symptoms that do not go away after caring for yourself at home.  Have pain that gets worse.  Have pain that affects your ability to move or do your daily activities. Summary  Plantar fasciitis is a painful foot condition that affects the heel. It occurs when the band of tissue that connects the toes to the heel bone (plantar fascia) becomes irritated.  The main symptom of this condition is heel pain that may be worse after exercising too much or standing still for a long time.  Treatment varies, but it usually starts with rest, ice, compression, and elevation (RICE therapy) and over-the-counter medicines to manage pain. This information is not intended to replace advice given to you by your health care provider. Make sure you discuss any questions you have with your health care provider. Document Released: 12/13/2000 Document Revised: 03/02/2017 Document Reviewed: 01/15/2017 Elsevier Patient Education  2020 [employer]  

## 2018-11-15 ENCOUNTER — Encounter: Payer: Medicare Other | Admitting: Physical Medicine & Rehabilitation

## 2018-11-18 ENCOUNTER — Encounter: Payer: Self-pay | Admitting: Internal Medicine

## 2018-11-19 ENCOUNTER — Encounter: Payer: Self-pay | Admitting: Internal Medicine

## 2018-11-28 DIAGNOSIS — I69398 Other sequelae of cerebral infarction: Secondary | ICD-10-CM | POA: Diagnosis not present

## 2018-11-28 DIAGNOSIS — R490 Dysphonia: Secondary | ICD-10-CM | POA: Diagnosis not present

## 2018-11-28 DIAGNOSIS — J3801 Paralysis of vocal cords and larynx, unilateral: Secondary | ICD-10-CM | POA: Diagnosis not present

## 2018-11-29 ENCOUNTER — Ambulatory Visit (INDEPENDENT_AMBULATORY_CARE_PROVIDER_SITE_OTHER): Payer: Medicare Other

## 2018-11-29 ENCOUNTER — Encounter: Payer: Self-pay | Admitting: Podiatry

## 2018-11-29 ENCOUNTER — Other Ambulatory Visit: Payer: Self-pay

## 2018-11-29 ENCOUNTER — Ambulatory Visit (INDEPENDENT_AMBULATORY_CARE_PROVIDER_SITE_OTHER): Payer: Medicare Other | Admitting: Podiatry

## 2018-11-29 VITALS — BP 137/70

## 2018-11-29 DIAGNOSIS — G463 Brain stem stroke syndrome: Secondary | ICD-10-CM | POA: Diagnosis not present

## 2018-11-29 DIAGNOSIS — M722 Plantar fascial fibromatosis: Secondary | ICD-10-CM

## 2018-11-29 DIAGNOSIS — M79674 Pain in right toe(s): Secondary | ICD-10-CM

## 2018-11-29 DIAGNOSIS — M79675 Pain in left toe(s): Secondary | ICD-10-CM

## 2018-11-29 DIAGNOSIS — B351 Tinea unguium: Secondary | ICD-10-CM | POA: Diagnosis not present

## 2018-12-02 NOTE — Progress Notes (Signed)
Subjective:   Patient ID: [name], male   DOB: 73 y.o.   MRN: [id number]   HPI Patient states for around 3 months has been having pain in the bottom of the left heel and also has nails that he wanted to have checked.  States it gets sore and is been hurting for around this period of time and getting worse over that time.  Patient does not smoke likes to be active   Review of Systems  All other systems reviewed and are negative.       Objective:  Physical Exam Vitals signs and nursing note reviewed.  Constitutional:      Appearance: He is well-developed.  Pulmonary:     Effort: Pulmonary effort is normal.  Musculoskeletal: Normal range of motion.  Skin:    General: Skin is warm.  Neurological:     Mental Status: He is alert.     Neurovascular status intact muscle strength found to be adequate range of motion within normal limits with exquisite discomfort plantar aspect left heel at the insertional point of the tendon into the calcaneus with inflammation fluid around the medial band.  Patient is found have good digital perfusion well oriented x3 with nails that are thick and yellow with brittle debris and pain with palpation     Assessment:  Acute plantar fasciitis left with inflammation fluid and mycotic nail infection 1-5 both feet with pain     Plan:  H&P conditions reviewed and today I did sterile prep injected the fascia left 3 mg Kenalog 5 mg Xylocaine and debrided nailbeds 1-5 both feet.  I then went ahead and discussed the fasciitis and placed into a brace with instructions for stretching exercises supportive shoes.  Reappoint 2 weeks  X-rays indicate there is spur no indication stress fracture arthritis

## 2018-12-11 ENCOUNTER — Ambulatory Visit (INDEPENDENT_AMBULATORY_CARE_PROVIDER_SITE_OTHER): Payer: Medicare Other

## 2018-12-11 ENCOUNTER — Other Ambulatory Visit: Payer: Self-pay

## 2018-12-11 DIAGNOSIS — Z23 Encounter for immunization: Secondary | ICD-10-CM

## 2018-12-12 ENCOUNTER — Other Ambulatory Visit: Payer: Self-pay

## 2018-12-12 ENCOUNTER — Ambulatory Visit (INDEPENDENT_AMBULATORY_CARE_PROVIDER_SITE_OTHER): Payer: Medicare Other | Admitting: Podiatry

## 2018-12-12 ENCOUNTER — Encounter: Payer: Self-pay | Admitting: Podiatry

## 2018-12-12 DIAGNOSIS — M79675 Pain in left toe(s): Secondary | ICD-10-CM

## 2018-12-12 DIAGNOSIS — B351 Tinea unguium: Secondary | ICD-10-CM

## 2018-12-12 DIAGNOSIS — G463 Brain stem stroke syndrome: Secondary | ICD-10-CM

## 2018-12-12 DIAGNOSIS — M79674 Pain in right toe(s): Secondary | ICD-10-CM | POA: Diagnosis not present

## 2018-12-12 DIAGNOSIS — M722 Plantar fascial fibromatosis: Secondary | ICD-10-CM

## 2018-12-13 NOTE — Progress Notes (Signed)
Subjective:   Patient ID: [name], male   DOB: 73 y.o.   MRN: [id number]   HPI Patient states heel seems quite a bit better and also my nails are thickened and I wanted to have them checked as a secondary complaint.  Patient states that the heels better but the arch is still bothersome   ROS      Objective:  Physical Exam  Neurovascular status intact with patient's left fascia mid arch being tender with palpation and patient noted to have improved heel pain.  Patient does have nail disease 1-5 both feet that are thick and yellow but overall there only moderately painful with palpation     Assessment:  Insertional fasciitis improved with moderate mid arch fasciitis noted left with nail disease bilateral     Plan:  H&P discussed both conditions and sterile prep done in mid arch injection administered left 3 mg Kenalog 5 mg Xylocaine and for the the nails I do not recommend current treatment but nail debridement will be done on an as-needed basis.  Education rendered to the patient today

## 2019-01-22 ENCOUNTER — Other Ambulatory Visit: Payer: Self-pay | Admitting: Internal Medicine

## 2019-01-22 DIAGNOSIS — K2100 Gastro-esophageal reflux disease with esophagitis, without bleeding: Secondary | ICD-10-CM

## 2019-01-24 ENCOUNTER — Encounter: Payer: Medicare Other | Attending: Physical Medicine & Rehabilitation | Admitting: Registered Nurse

## 2019-01-24 ENCOUNTER — Ambulatory Visit: Payer: Medicare Other | Admitting: Registered Nurse

## 2019-01-24 ENCOUNTER — Encounter: Payer: Self-pay | Admitting: Registered Nurse

## 2019-01-24 ENCOUNTER — Other Ambulatory Visit: Payer: Self-pay

## 2019-01-24 VITALS — Temp 98.6°F | Wt 386.0 lb

## 2019-01-24 DIAGNOSIS — M545 Low back pain: Secondary | ICD-10-CM | POA: Diagnosis not present

## 2019-01-24 DIAGNOSIS — G463 Brain stem stroke syndrome: Secondary | ICD-10-CM

## 2019-01-24 DIAGNOSIS — M7989 Other specified soft tissue disorders: Secondary | ICD-10-CM | POA: Insufficient documentation

## 2019-01-24 DIAGNOSIS — G464 Cerebellar stroke syndrome: Secondary | ICD-10-CM

## 2019-01-24 DIAGNOSIS — R531 Weakness: Secondary | ICD-10-CM | POA: Insufficient documentation

## 2019-01-24 DIAGNOSIS — Z5181 Encounter for therapeutic drug level monitoring: Secondary | ICD-10-CM

## 2019-01-24 DIAGNOSIS — M47817 Spondylosis without myelopathy or radiculopathy, lumbosacral region: Secondary | ICD-10-CM

## 2019-01-24 DIAGNOSIS — R200 Anesthesia of skin: Secondary | ICD-10-CM | POA: Insufficient documentation

## 2019-01-24 DIAGNOSIS — G8929 Other chronic pain: Secondary | ICD-10-CM | POA: Diagnosis not present

## 2019-01-24 DIAGNOSIS — Z79891 Long term (current) use of opiate analgesic: Secondary | ICD-10-CM

## 2019-01-24 DIAGNOSIS — R0681 Apnea, not elsewhere classified: Secondary | ICD-10-CM | POA: Insufficient documentation

## 2019-01-24 DIAGNOSIS — Z8673 Personal history of transient ischemic attack (TIA), and cerebral infarction without residual deficits: Secondary | ICD-10-CM | POA: Insufficient documentation

## 2019-01-24 DIAGNOSIS — M533 Sacrococcygeal disorders, not elsewhere classified: Secondary | ICD-10-CM | POA: Diagnosis not present

## 2019-01-24 DIAGNOSIS — G894 Chronic pain syndrome: Secondary | ICD-10-CM | POA: Diagnosis not present

## 2019-01-24 DIAGNOSIS — M25552 Pain in left hip: Secondary | ICD-10-CM | POA: Diagnosis not present

## 2019-01-24 DIAGNOSIS — R062 Wheezing: Secondary | ICD-10-CM | POA: Insufficient documentation

## 2019-01-24 DIAGNOSIS — R262 Difficulty in walking, not elsewhere classified: Secondary | ICD-10-CM | POA: Insufficient documentation

## 2019-01-24 DIAGNOSIS — Z87891 Personal history of nicotine dependence: Secondary | ICD-10-CM | POA: Diagnosis not present

## 2019-01-24 DIAGNOSIS — M2550 Pain in unspecified joint: Secondary | ICD-10-CM | POA: Diagnosis not present

## 2019-01-24 DIAGNOSIS — R202 Paresthesia of skin: Secondary | ICD-10-CM | POA: Diagnosis not present

## 2019-01-24 DIAGNOSIS — R42 Dizziness and giddiness: Secondary | ICD-10-CM | POA: Insufficient documentation

## 2019-01-24 DIAGNOSIS — Z8249 Family history of ischemic heart disease and other diseases of the circulatory system: Secondary | ICD-10-CM | POA: Insufficient documentation

## 2019-01-24 DIAGNOSIS — M25551 Pain in right hip: Secondary | ICD-10-CM | POA: Diagnosis not present

## 2019-01-24 DIAGNOSIS — I110 Hypertensive heart disease with heart failure: Secondary | ICD-10-CM | POA: Diagnosis not present

## 2019-01-24 DIAGNOSIS — R0602 Shortness of breath: Secondary | ICD-10-CM | POA: Insufficient documentation

## 2019-01-24 DIAGNOSIS — I5032 Chronic diastolic (congestive) heart failure: Secondary | ICD-10-CM | POA: Insufficient documentation

## 2019-01-24 NOTE — Progress Notes (Signed)
Subjective:    Patient ID: [name], male    DOB: 10-Sep-1945, 73 y.o.   MRN: [id number]  HPI: [name] is a 73 y.o. male who returns for follow up appointment for chronic pain and medication refill. He states his pain is located in his lower back and left foot, also reports his podiatrist is following his left foot pain. He rates his  Pain 4. His current exercise regime is walking. He walks with a cane in his home and uses the walker when he's outside of his home he states.   [name] Morphine equivalent is  15.00  MME. He is also prescribed Lorazepam by Dr. Adele Schilder. We have discussed the black box warning of using opioids and benzodiazepines. I highlighted the dangers of using these drugs together and discussed the adverse events including respiratory suppression, overdose, cognitive impairment and importance of compliance with current regimen. We will continue to monitor and adjust as indicated.   He is being closely monitored and under the care of his psychiatrist Dr. Adele Schilder.   [name] VS: 120/79 P 80 and Sats: 93%   Pain Inventory Average Pain 4 Pain Right Now 4 My pain is dull, tingling and aching  In the last 24 hours, has pain interfered with the following? General activity 0 Relation with others 0 Enjoyment of life 0 What TIME of day is your pain at its worst? daytime Sleep (in general) Good  Pain is worse with: walking, bending and standing Pain improves with: medication Relief from Meds: 7  Mobility walk with assistance use a walker ability to climb steps?  yes do you drive?  yes Do you have any goals in this area?  yes  Function retired  Neuro/Psych weakness numbness tingling trouble walking dizziness  Prior Studies Any changes since last visit?  no  Physicians involved in your care Any changes since last visit?  no   Family History  Problem Relation Age of Onset  . Depression Mother   . Hypertension Mother   . Dementia  Mother   . Heart disease Father        MI  . Depression Brother   . Stroke Paternal Aunt        CVA  . Diabetes Paternal Uncle   . Heart disease Paternal Uncle        MI  . Heart disease Paternal Grandmother        MI  . Diabetes Cousin        PATERNAL   Social History   Socioeconomic History  . Marital status: Married    Spouse name: Not on file  . Number of children: 1  . Years of education: Not on file  . Highest education level: Not on file  Occupational History  . Occupation: Merchant navy officer  Social Needs  . Financial resource strain: Not very hard  . Food insecurity    Worry: Never true    Inability: Never true  . Transportation needs    Medical: No    Non-medical: No  Tobacco Use  . Smoking status: Former Smoker    Packs/day: 0.20    Years: 13.00    Pack years: 2.60    Types: Cigarettes    Start date: 04/03/1961    Quit date: 08/30/1974    Years since quitting: 44.4  . Smokeless tobacco: Never Used  Substance and Sexual Activity  . Alcohol use: No    Alcohol/week: 0.0 standard drinks  . Drug use:  No  . Sexual activity: Not Currently  Lifestyle  . Physical activity    Days per week: 0 days    Minutes per session: 0 min  . Stress: To some extent  Relationships  . Social connections    Talks on phone: More than three times a week    Gets together: Once a week    Attends religious service: 1 to 4 times per year    Active member of club or organization: Yes    Attends meetings of clubs or organizations: 1 to 4 times per year    Relationship status: Married  Other Topics Concern  . Not on file  Social History Narrative   Lives at home w/ his wife   Right-handed   Caffeine: decaf coffee, occasional tea   Past Surgical History:  Procedure Laterality Date  . ARTHROSCOPY KNEE W/ DRILLING    . BREATH TEK H PYLORI N/A 12/23/2012   Procedure: BREATH TEK H PYLORI;  Surgeon: Gayland Curry, MD;  Location: [facility];  Service: General;   Laterality: N/A;  . CHOLECYSTECTOMY  1989  . COLONOSCOPY N/A 07/31/2013   Procedure: COLONOSCOPY;  Surgeon: Gatha Mayer, MD;  Location: [place];  Service: Endoscopy;  Laterality: N/A;  . ESOPHAGOGASTRODUODENOSCOPY N/A 07/22/2013   Procedure: ESOPHAGOGASTRODUODENOSCOPY (EGD);  Surgeon: Irene Shipper, MD;  Location: [facility] ENDOSCOPY;  Service: Endoscopy;  Laterality: N/A;  . ESOPHAGOGASTRODUODENOSCOPY N/A 07/31/2013   Procedure: ESOPHAGOGASTRODUODENOSCOPY (EGD);  Surgeon: Gatha Mayer, MD;  Location: [facility] ENDOSCOPY;  Service: Endoscopy;  Laterality: N/A;  . INTRAOPERATIVE TRANSESOPHAGEAL ECHOCARDIOGRAM N/A 07/18/2013   Procedure: INTRAOPERATIVE TRANSESOPHAGEAL ECHOCARDIOGRAM;  Surgeon: Grace Isaac, MD;  Location: [place];  Service: Open Heart Surgery;  Laterality: N/A;  . LAPAROSCOPIC GASTRIC BANDING WITH HIATAL HERNIA REPAIR N/A 04/08/2013   Procedure: LAPAROSCOPIC GASTRIC BANDING WITH possible  HIATAL HERNIA REPAIR;  Surgeon: Gayland Curry, MD;  Location: [facility];  Service: General;  Laterality: N/A;  . LITHOTRIPSY    . renal calculi    . SUBXYPHOID PERICARDIAL WINDOW N/A 07/18/2013   Procedure: SUBXYPHOID PERICARDIAL WINDOW;  Surgeon: Grace Isaac, MD;  Location: [place];  Service: Thoracic;  Laterality: N/A;  . TONSILLECTOMY    . total knee replacment     bilat  . trigger finger repair     also lue, cts surgically repaired  . WISDOM TOOTH EXTRACTION     Past Medical History:  Diagnosis Date  . Anemia 07/29/2013  . Asthma    childhood asthma  . Benign essential HTN   . Benign prostatic hypertrophy    several prostate biopsies neg for cancer.   . Bilateral hip pain   . Cancer ([place])   . CHF (congestive heart failure) ([place])   . Chronic anticoagulation 07/22/2013  . Complication of anesthesia    MORPHINE CAUSES HALLUCINATIONS  . CVA (cerebral vascular accident) ([place]) 06/2016  . Depression   . Diastolic congestive heart failure ([place]) 07/16/2013  . Dysphagia, post-stroke   .  Embolic cerebral infarction ([place]) 06/21/2016  . FH: colonic polyps 2010  . Gout   . History of kidney stones   . Hypertension   . Kidney cysts   . Morbid obesity ([place])   . Numbness    feet / legs  . PAF (paroxysmal atrial fibrillation) with RVR 07/29/2013  . Pericarditis 2015   s/p window  . Recurrent pulmonary emboli ([place]) 06/13/2015  . Sleep apnea    USES C PAP  . Stroke syndrome   .  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   Temp 98.6 F (37 C) (Oral)   Wt (!) 386 lb (175.1 kg)   BMI 52.35 kg/m   Opioid Risk Score:   Fall Risk Score:  `1  Depression screen PHQ 2/9  Depression screen [facility] 2/9 11/14/2018 05/16/2018 10/01/2017 07/28/2016  Decreased Interest 1 1 0 0  Down, Depressed, Hopeless 1 1 0 0  PHQ - 2 Score 2 2 0 0  Altered sleeping 1 1 - 0  Tired, decreased energy 2 2 - 1  Change in appetite 1 0 - 0  Feeling bad or failure about yourself  1 1 - 0  Trouble concentrating 1 0 - 1  Moving slowly or fidgety/restless 0 0 - 0  Suicidal thoughts 0 0 - -  PHQ-9 Score 8 6 - 2  Difficult doing work/chores Somewhat difficult Not difficult at all - Not difficult at all  Some recent data might be hidden    Review of Systems  Constitutional: Positive for unexpected weight change.  HENT: Negative.   Eyes: Negative.   Respiratory: Positive for apnea, shortness of breath and wheezing.   Cardiovascular: Negative.   Gastrointestinal: Negative.   Endocrine: Negative.   Genitourinary: Negative.   Musculoskeletal: Positive for gait problem.  Skin: Negative.   Allergic/Immunologic: Negative.   Neurological: Positive for dizziness, weakness and numbness.       Tingling  Hematological: Negative.   Psychiatric/Behavioral: Negative.   All other systems reviewed and are negative.      Objective:   Physical Exam Vitals signs and nursing note reviewed.  Constitutional:      Appearance: Normal appearance. He is obese.  Neck:     Musculoskeletal: Normal range of motion and neck supple.   Cardiovascular:     Rate and Rhythm: Normal rate and regular rhythm.     Pulses: Normal pulses.     Heart sounds: Normal heart sounds.  Pulmonary:     Effort: Pulmonary effort is normal.     Breath sounds: Normal breath sounds.  Musculoskeletal:     Comments: Normal Muscle Bulk and Muscle Testing Reveals:  Upper Extremities: Full ROM and Muscle Strength 5/5 Lumbar Paraspinal Tenderness: L-4-L-5 Lower Extremities: Full ROM and Muscle Strength 5/5 Arises from Table Slowly Using Walker for support Narrow Based Gait   Skin:    General: Skin is warm and dry.  Neurological:     Mental Status: He is alert and oriented to person, place, and time.  Psychiatric:        Mood and Affect: Mood normal.        Behavior: Behavior normal.           Assessment & Plan:  1.Chronic Low Back Pain/  Lumbosacral Spondylosis without myelopathy: S/P Right Lumbar MBB on 10/24/2018 with good relief noted. Encouraged to increase HEP as tolerated. Continue to monitor.  2. Chronic Deconditioning/ Morbid Obesity History of Wallenberg Syndrome: Encouraged to increase HEP as Tolerated. He verbalizes understanding. Continue to Monitor.  3. Chronic Pain Syndrome: Continue Tramadol as needed for pain. Continue to monitor.  We will continue the opioid monitoring program, this consists of regular clinic visits, examinations, urine drug screen, pill counts as well as use of [place] Controlled Substance Reporting system.  20 minutes of face to face patient care time was spent during this visit. All questions were encouraged and answered.  F/U in 6 months with Dr Letta Pate.

## 2019-01-27 ENCOUNTER — Ambulatory Visit: Payer: Medicare Other | Admitting: Internal Medicine

## 2019-02-04 DIAGNOSIS — N200 Calculus of kidney: Secondary | ICD-10-CM | POA: Diagnosis not present

## 2019-02-06 ENCOUNTER — Other Ambulatory Visit: Payer: Self-pay

## 2019-02-06 ENCOUNTER — Encounter: Payer: Self-pay | Admitting: Podiatry

## 2019-02-06 ENCOUNTER — Ambulatory Visit (INDEPENDENT_AMBULATORY_CARE_PROVIDER_SITE_OTHER): Payer: Medicare Other

## 2019-02-06 ENCOUNTER — Ambulatory Visit (INDEPENDENT_AMBULATORY_CARE_PROVIDER_SITE_OTHER): Payer: Medicare Other | Admitting: Podiatry

## 2019-02-06 DIAGNOSIS — M25572 Pain in left ankle and joints of left foot: Secondary | ICD-10-CM | POA: Diagnosis not present

## 2019-02-06 DIAGNOSIS — M722 Plantar fascial fibromatosis: Secondary | ICD-10-CM

## 2019-02-06 DIAGNOSIS — M779 Enthesopathy, unspecified: Secondary | ICD-10-CM | POA: Diagnosis not present

## 2019-02-06 DIAGNOSIS — G464 Cerebellar stroke syndrome: Secondary | ICD-10-CM

## 2019-02-07 ENCOUNTER — Other Ambulatory Visit: Payer: Self-pay | Admitting: Podiatry

## 2019-02-07 ENCOUNTER — Encounter: Payer: Self-pay | Admitting: Internal Medicine

## 2019-02-07 ENCOUNTER — Ambulatory Visit (INDEPENDENT_AMBULATORY_CARE_PROVIDER_SITE_OTHER): Payer: Medicare Other | Admitting: Internal Medicine

## 2019-02-07 ENCOUNTER — Ambulatory Visit: Payer: Self-pay

## 2019-02-07 DIAGNOSIS — G464 Cerebellar stroke syndrome: Secondary | ICD-10-CM

## 2019-02-07 DIAGNOSIS — M779 Enthesopathy, unspecified: Secondary | ICD-10-CM

## 2019-02-07 DIAGNOSIS — M722 Plantar fascial fibromatosis: Secondary | ICD-10-CM

## 2019-02-07 DIAGNOSIS — Z7901 Long term (current) use of anticoagulants: Secondary | ICD-10-CM | POA: Diagnosis not present

## 2019-02-07 DIAGNOSIS — G63 Polyneuropathy in diseases classified elsewhere: Secondary | ICD-10-CM

## 2019-02-07 DIAGNOSIS — M79662 Pain in left lower leg: Secondary | ICD-10-CM | POA: Diagnosis not present

## 2019-02-07 DIAGNOSIS — R7303 Prediabetes: Secondary | ICD-10-CM

## 2019-02-07 MED ORDER — HYDROCODONE-ACETAMINOPHEN 5-325 MG PO TABS: 1.0000 | ORAL_TABLET | Freq: Four times a day (QID) | ORAL | 0 refills | Status: DC | PRN

## 2019-02-07 NOTE — Progress Notes (Signed)
Subjective:   Patient ID: [name], male   DOB: 73 y.o.   MRN: [id number]   HPI Patient states he is developed a lot of pain in his left ankle joint recently and states he does not remember injury.  Patient states his A1c has been under control and he also has some irritation around the fifth metatarsal but the ankle joint itself appears to be worse   ROS      Objective:  Physical Exam  Neurovascular status intact with patient noted to have discomfort mostly in the lateral ankle gutter with moderate reduced range of motion and swelling which is consistent and normal in his ankle with negative [name]' sign noted.  Mild discomfort around the fifth MPJ and the arch but I was unable to find another area of acute inflammatory pain     Assessment:  Appears to be a inflammatory condition of the left ankle gutter sinus tarsi with also fasciitis-like symptoms left and fifth MPJ pain left     Plan:  H&P reviewed all conditions with patient and did sterile prep and injected the lateral ankle gutter 3 mg Kenalog 5 mg Xylocaine advised on reduced activity and may require further treatment depending on the response and reviewed fasciitis and recommendations for supportive therapy and brace which was dispensed today.  If symptoms were to get worse patient is to let us know right away  X-rays were negative for signs of stress fracture and did reveal quite a bit of arthritis of the subtalar joint left and midfoot left

## 2019-02-07 NOTE — Progress Notes (Signed)
Subjective:    Patient ID: [name], male    DOB: 08-31-1945, 73 y.o.   MRN: [id number]  HPI  Here to f/u with c/o left post calf pain x 3 days, dull achy constant, mild without swelling (wife even measured both his calves), denies knee pain even s/p left TKR with occasional giveaways on going up stairs, and has good compliance with eliquis for prior hx of PE.  Usually walks with seated walker, no falls of change in activity recently.  Tramadol did not seem to help yesterday.  Pt continues to have recurring LBP without change in severity, bowel or bladder change, fever, wt loss,  worsening LE pain/numbness/weakness, gait change or falls, and better after recent ESI.  Has also bilat feet numbness but no worsening numbness or weakness to the LEs,  Has been recently tx for plantar fasciitis s/p cortisone and did seem to help.  Pt denies chest pain, increased sob or doe, wheezing, orthopnea, PND, increased LE swelling, palpitations, dizziness or syncope.   Pt denies polydipsia, polyuria.  Past Medical History:  Diagnosis Date  . Anemia 07/29/2013  . Asthma    childhood asthma  . Benign essential HTN   . Benign prostatic hypertrophy    several prostate biopsies neg for cancer.   . Bilateral hip pain   . Cancer ([place])   . CHF (congestive heart failure) ([place])   . Chronic anticoagulation 07/22/2013  . Complication of anesthesia    MORPHINE CAUSES HALLUCINATIONS  . CVA (cerebral vascular accident) ([place]) 06/2016  . Depression   . Diastolic congestive heart failure ([place]) 07/16/2013  . Dysphagia, post-stroke   . Embolic cerebral infarction ([place]) 06/21/2016  . FH: colonic polyps 2010  . Gout   . History of kidney stones   . Hypertension   . Kidney cysts   . Morbid obesity ([place])   . Numbness    feet / legs  . PAF (paroxysmal atrial fibrillation) with RVR 07/29/2013  . Pericarditis 2015   s/p window  . Recurrent pulmonary emboli ([place]) 06/13/2015  . Sleep apnea    USES C PAP  . Stroke  syndrome   . 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 ARTHROSCOPY KNEE W/ DRILLING    . BREATH TEK H PYLORI N/A 12/23/2012   Procedure: BREATH TEK H PYLORI;  Surgeon: Gayland Curry, MD;  Location: [facility];  Service: General;  Laterality: N/A;  . CHOLECYSTECTOMY  1989  . COLONOSCOPY N/A 07/31/2013   Procedure: COLONOSCOPY;  Surgeon: Gatha Mayer, MD;  Location: [place];  Service: Endoscopy;  Laterality: N/A;  . ESOPHAGOGASTRODUODENOSCOPY N/A 07/22/2013   Procedure: ESOPHAGOGASTRODUODENOSCOPY (EGD);  Surgeon: Irene Shipper, MD;  Location: [facility] ENDOSCOPY;  Service: Endoscopy;  Laterality: N/A;  . ESOPHAGOGASTRODUODENOSCOPY N/A 07/31/2013   Procedure: ESOPHAGOGASTRODUODENOSCOPY (EGD);  Surgeon: Gatha Mayer, MD;  Location: [facility] ENDOSCOPY;  Service: Endoscopy;  Laterality: N/A;  . INTRAOPERATIVE TRANSESOPHAGEAL ECHOCARDIOGRAM N/A 07/18/2013   Procedure: INTRAOPERATIVE TRANSESOPHAGEAL ECHOCARDIOGRAM;  Surgeon: Grace Isaac, MD;  Location: [place];  Service: Open Heart Surgery;  Laterality: N/A;  . LAPAROSCOPIC GASTRIC BANDING WITH HIATAL HERNIA REPAIR N/A 04/08/2013   Procedure: LAPAROSCOPIC GASTRIC BANDING WITH possible  HIATAL HERNIA REPAIR;  Surgeon: Gayland Curry, MD;  Location: [facility];  Service: General;  Laterality: N/A;  . LITHOTRIPSY    . renal calculi    . SUBXYPHOID PERICARDIAL WINDOW N/A 07/18/2013   Procedure: SUBXYPHOID PERICARDIAL WINDOW;  Surgeon:  Grace Isaac, MD;  Location: [place];  Service: Thoracic;  Laterality: N/A;  . TONSILLECTOMY    . total knee replacment     bilat  . trigger finger repair     also lue, cts surgically repaired  . WISDOM TOOTH EXTRACTION      reports that he quit smoking about 44 years ago. His smoking use included cigarettes. He started smoking about 57 years ago. He has a 2.60 pack-year smoking history. He has never used smokeless tobacco. He reports that he does not drink alcohol or use drugs.  family history includes Dementia in his mother; Depression in his brother and mother; Diabetes in his cousin and paternal uncle; Heart disease in his father, paternal grandmother, and paternal uncle; Hypertension in his mother; Stroke in his paternal aunt. Allergies  Allergen Reactions  . Morphine Nausea And Vomiting and Other (See Comments)    Severe hallucinations  . Codeine Nausea Only  . Penicillins Other (See Comments)    Passed out after injection Did it involve swelling of the face/tongue/throat, SOB, or low BP? Unknown Did it involve sudden or severe rash/hives, skin peeling, or any reaction on the inside of your mouth or nose? No Did you need to seek medical attention at a hospital or doctor's office? Yes When did it last happen?teenager If all above answers are "NO", may proceed with cephalosporin use.  [name] Propoxyphene N-Acetaminophen Nausea Only   Current Outpatient Medications on File Prior to Visit  Medication Sig Dispense Refill  . apixaban (ELIQUIS) 5 MG TABS tablet Take 1 tablet (5 mg total) by mouth 2 (two) times daily. 180 tablet 1  . atorvastatin (LIPITOR) 80 MG tablet Take 1 tablet (80 mg total) by mouth daily. 90 tablet 1  . Cyanocobalamin (VITAMIN B-12 PO) Take 1 tablet by mouth at bedtime.    [name] Sodium (DSS) 100 MG CAPS Take 100 mg by mouth every other day.    . finasteride (PROSCAR) 5 MG tablet Take 1 tablet (5 mg total) by mouth daily. 30 tablet 1  . flecainide (TAMBOCOR) 50 MG tablet Take 1 tablet (50 mg total) by mouth every 12 (twelve) hours. OV NEEED 180 tablet 3  . lidocaine (LIDODERM) 5 % Place 1 patch onto the skin daily. Remove & Discard patch within 12 hours or as directed by MD 12 patch 0  . loratadine (CLARITIN) 10 MG tablet Take 10 mg by mouth daily.    [name] LORazepam (ATIVAN) 0.5 MG tablet Take 1 tablet (0.5 mg total) by mouth at bedtime. 90 tablet 1  . montelukast (SINGULAIR) 10 MG tablet Take 1 tablet (10 mg total) by mouth at bedtime. 90  tablet 1  . OVER THE COUNTER MEDICATION Take 1 tablet by mouth daily. Multi vitamin with iron    . pantoprazole (PROTONIX) 40 MG tablet Take 1 tablet by mouth once daily 90 tablet 0  . potassium chloride SA (K-DUR) 20 MEQ tablet Take 1 tablet (20 mEq total) by mouth 2 (two) times daily. Take one table (20 meq) by mouth twice daily - afternoon and bedtime 60 tablet 5  . PRESCRIPTION MEDICATION Inhale into the lungs at bedtime. CPAP - setting 20    . rOPINIRole (REQUIP) 0.5 MG tablet Take 1 tablet (0.5 mg total) by mouth 3 (three) times daily. (Patient taking differently: Take 1.5 mg by mouth at bedtime. ) 270 tablet 3  . sertraline (ZOLOFT) 100 MG tablet Take 1.5 tablets (150 mg total) by mouth at bedtime. [place]  tablet 1  . torsemide (DEMADEX) 20 MG tablet TAKE 3 TABLETS (60 MG TOTAL) BY MOUTH DAILY. KEEP APPOINTMENT AS SCHEDULED. 270 tablet 3  . traMADol (ULTRAM) 50 MG tablet Take 1 tablet (50 mg total) by mouth every 8 (eight) hours as needed. 90 tablet 1   No current facility-administered medications on file prior to visit.    Review of Systems  Constitutional: Negative for other unusual diaphoresis or sweats HENT: Negative for ear discharge or swelling Eyes: Negative for other worsening visual disturbances Respiratory: Negative for stridor or other swelling  Gastrointestinal: Negative for worsening distension or other blood Genitourinary: Negative for retention or other urinary change Musculoskeletal: Negative for other MSK pain or swelling Skin: Negative for color change or other new lesions Neurological: Negative for worsening tremors and other numbness  Psychiatric/Behavioral: Negative for worsening agitation or other fatigue All otherwise neg per pt     Objective:   Physical Exam BP 128/86   Pulse 83   Temp 98.2 F (36.8 C) (Oral)   Ht 6' (1.829 m)   Wt (!) 385 lb (174.6 kg)   SpO2 94%   BMI 52.22 kg/m  VS noted, not ill appearing but nervous Constitutional: Pt appears in  NAD HENT: Head: NCAT.  Right Ear: External ear normal.  Left Ear: External ear normal.  Eyes: . Pupils are equal, round, and reactive to light. Conjunctivae and EOM are normal Nose: without d/c or deformity Neck: Neck supple. Gross normal ROM Cardiovascular: Normal rate and regular rhythm.   Pulmonary/Chest: Effort normal and breath sounds without rales or wheezing.  Neurological: Pt is alert. At baseline orientation, motor grossly intact Skin: Skin is warm. No rashes, other new lesions, no LE edema or Left calf tender, swelling or skin change, dorsalis pedis 1+ bilateral Psychiatric: Pt behavior is normal without agitation , nervous All otherwise neg per pt Lab Results  Component Value Date   WBC 9.7 10/14/2018   HGB 13.9 10/14/2018   HCT 45.3 10/14/2018   PLT 212 10/14/2018   GLUCOSE 124 (H) 10/14/2018   CHOL 139 05/16/2018   TRIG 203.0 (H) 05/16/2018   HDL 29.70 (L) 05/16/2018   LDLDIRECT 91.0 05/16/2018   LDLCALC 77 06/19/2016   ALT 17 05/16/2018   AST 21 05/16/2018   NA 140 10/14/2018   K 3.5 10/14/2018   CL 101 10/14/2018   CREATININE 1.30 (H) 10/14/2018   BUN 21 10/14/2018   CO2 29 10/14/2018   TSH 2.04 05/16/2018   PSA 0.68 12/24/2015   INR 1.09 06/19/2016   HGBA1C 6.5 (A) 11/14/2018   MICROALBUR 1.1 12/12/2006       Assessment & Plan:

## 2019-02-07 NOTE — Telephone Encounter (Signed)
Pt. Reports pain in left calf x 3 days. No swelling or reness. Pain is 8-9/10. Seeing podiatry and was seen yesterday for foot pain. States he has a history of PE's. Is on a blood thinner. Requests a visit. Warm transfer to Sam in the practice.  Reason for Disposition . [1] Thigh or calf pain AND [2] only 1 side AND [3] present > 1 hour  Answer Assessment - Initial Assessment Questions 1. ONSET: "When did the pain start?"      3-4 days 2. LOCATION: "Where is the pain located?"      Left calf 3. PAIN: "How bad is the pain?"    (Scale 1-10; or mild, moderate, severe)   -  MILD (1-3): doesn't interfere with normal activities    -  MODERATE (4-7): interferes with normal activities (e.g., work or school) or awakens from sleep, limping    -  SEVERE (8-10): excruciating pain, unable to do any normal activities, unable to walk     8-9 4. WORK OR EXERCISE: "Has there been any recent work or exercise that involved this part of the body?"      No 5. CAUSE: "What do you think is causing the leg pain?"     Maybe a clot 6. OTHER SYMPTOMS: "Do you have any other symptoms?" (e.g., chest pain, back pain, breathing difficulty, swelling, rash, fever, numbness, weakness)     No swelling 7. PREGNANCY: "Is there any chance you are pregnant?" "When was your last menstrual period?"     n/a  Protocols used: LEG PAIN-A-AH

## 2019-02-07 NOTE — Patient Instructions (Signed)
Please take all new medication as prescribed - the pain medication if needed  Please continue all other medications as before, and refills have been done if requested.  Please have the pharmacy call with any other refills you may need.  Please continue your efforts at being more active, low cholesterol diet, and weight control.  Please keep your appointments with your specialists as you may have planned

## 2019-02-07 NOTE — Telephone Encounter (Signed)
Noted  Pt has already been scheduled for an ov.

## 2019-02-08 ENCOUNTER — Encounter: Payer: Self-pay | Admitting: Internal Medicine

## 2019-02-08 NOTE — Assessment & Plan Note (Signed)
stable overall by history and exam, recent data reviewed with pt, and pt to continue medical treatment as before,  to f/u any worsening symptoms or concerns  

## 2019-02-08 NOTE — Assessment & Plan Note (Signed)
Exam benign, on eliquis which argues against DVT and declines stat venous doppler or seen in ED, suspect msk strain non specific, to cont same tx, f/u sport med if persists

## 2019-02-08 NOTE — Assessment & Plan Note (Signed)
To continue eliquis

## 2019-02-12 ENCOUNTER — Ambulatory Visit: Payer: Medicare Other | Admitting: Podiatry

## 2019-04-14 ENCOUNTER — Other Ambulatory Visit: Payer: Self-pay | Admitting: Internal Medicine

## 2019-04-14 ENCOUNTER — Telehealth: Payer: Self-pay | Admitting: Internal Medicine

## 2019-04-14 DIAGNOSIS — I480 Paroxysmal atrial fibrillation: Secondary | ICD-10-CM

## 2019-04-14 MED ORDER — APIXABAN 5 MG PO TABS: 5.0000 mg | ORAL_TABLET | Freq: Two times a day (BID) | ORAL | 1 refills | Status: DC

## 2019-04-14 NOTE — Telephone Encounter (Signed)
Pt notified he will call [facility] and have them call us again

## 2019-04-14 NOTE — Telephone Encounter (Signed)
New message ° ° °Patient calling the office for samples of medication: ° ° °1.  What medication and dosage are you requesting samples for?apixaban (ELIQUIS) 5 MG TABS tablet ° °2.  Are you currently out of this medication? No  ° ° °

## 2019-04-17 ENCOUNTER — Other Ambulatory Visit: Payer: Self-pay

## 2019-04-17 ENCOUNTER — Encounter: Payer: Self-pay | Admitting: Internal Medicine

## 2019-04-17 ENCOUNTER — Ambulatory Visit (INDEPENDENT_AMBULATORY_CARE_PROVIDER_SITE_OTHER): Payer: Medicare Other | Admitting: Internal Medicine

## 2019-04-17 VITALS — BP 134/72 | HR 81 | Temp 97.3°F | Ht 72.0 in | Wt 385.2 lb

## 2019-04-17 DIAGNOSIS — G4733 Obstructive sleep apnea (adult) (pediatric): Secondary | ICD-10-CM

## 2019-04-17 NOTE — Progress Notes (Signed)
HPI male former smoker followed for OSA, Restless Legs,, complicated by A. fib, recurrent PE, bariatric surgery, major depression, dysphonia, midbrain CVA NPSG 10/25/05- severe OSA, AHI 79/ hr, CPAP to 16. Frequent limb jerks with sleep disturbance PFT 01/03/17-minimal obstruction without response to bronchodilator.  FVC 3.94/83%, FEV1 3.00/86%, ratio 2.76, TLC 96%, DLCO 78% Walk test on room air 04/08/2018-  Max HR 91, Min Sat 92% -----------------------------------------------------------------------------------  04/08/18- 74 year old male former smoker followed for OSA, Restless Legs, complicated by A. Fib, diastolic CHF recurrent PE, bariatric surgery, major depression, dysphonia, midbrain CVA CPAP set 20/Advanced -----OSA: DME [facility] Pt wears CPAP nighlty and DL attached. Pt notes Increased SOB with any exertion.  Download 87% compliance AHI 5.6/hour He has been more short of breath than usual and had to stop while walking from lobby to exam room. Body weight today 380 pounds-  In this range over past year Walk test on room air 04/08/2018-  Max HR 91, Min Sat 92%. Complains he stays tired, not relieved by naps.  May not wear CPAP with naps.  Admits bedtime as late as 2 AM, gets up late in the morning.  Very sensitive to caffeine so avoids it.  Unsteady walking even with a walker so inactive.  Very deconditioned.  Any activity makes him short of breath. Requip used to help leg jerks taking 2 or 3 tablets at night.  1 tablet is not sufficient. Vocal cord paralysis following CVA, being treated at voice clinic at [facility].  Strangles easily while eating or drinking.  04/17/19- 74 year old male former smoker followed for OSA, Restless Legs, complicated by A. Fib, diastolic CHF recurrent PE, bariatric surgery, major depression, dysphonia, midbrain CVA, CPAP set 20/Advanced Download compliance 90%, AHI 6.3/ hr Requip 0.5 mg x 3 hs,  Body weight today 385 lbs ED visit for back pain in July. -----Obstructive  Sleep Apnea Very limited mobility due to weight and arthralgias. Hoping for Medicare wheel chair.  Due to lack of room he keeps CPAP on foot of bed. Stopped using humidifier because it kept spilling. Mouth dry. Suggesting Biotene.  ROS-see HPI   + = positive Constitutional:    weight loss, night sweats, fevers, chills, fatigue, lassitude. HEENT:    headaches, difficulty swallowing, tooth/dental problems, sore throat,       sneezing, itching, ear ache,+ nasal congestion, post nasal drip, snoring CV:    chest pain, orthopnea, PND, swelling in lower extremities, anasarca,                                                dizziness, +palpitations Resp:   +shortness of breath with exertion or at rest.                productive cough,   non-productive cough, coughing up of blood.              change in color of mucus.  wheezing.   Skin:    rash or lesions. GI:  No-   heartburn, indigestion, abdominal pain, nausea, vomiting,  GU:  MS:   joint pain, stiffness, Neuro-     nothing unusual Psych:  change in mood or affect.  depression or anxiety.   memory loss.  OBJ- Physical Exam General- Alert, Oriented, Affect-appropriate, Distress- none acute, + morbidly obese, +rolling walker Skin- rash-none, lesions- none, excoriation- none Lymphadenopathy- none Head- atraumatic  Eyes- Gross vision intact, PERRLA, conjunctivae and secretions clear            Ears- +Hearing aid            Nose-  +moderate turbinate edema and mucus congestion, no-Septal dev, mucus, polyps,  erosion, perforation             Throat- Mallampati III , mucosa clear , drainage- none, tonsils- atrophic, + hoarse Neck- flexible , trachea midline, no stridor , thyroid nl, carotid no bruit Chest - symmetrical excursion , unlabored           Heart/CV- RRR , no murmur , no gallop  , no rub, nl s1 s2                           - JVD- none , edema- none, stasis changes- none, varices- none           Lung- clear to P&A, wheeze- none,  cough- none , dullness-none, rub- none           Chest wall-  Abd-  Br/ Gen/ Rectal- Not done, not indicated Extrem- cyanosis- none, clubbing, none, atrophy- none, strength- nl Neuro- grossly intact to observation

## 2019-04-17 NOTE — Patient Instructions (Addendum)
Order- schedule "CPAP to BIPAP titration" sleep study  Please call us about 2 weeks after the sleep study for results and recommendations. If appropriate, we may be able change you to a BIPAP machine which may be more comfortable.  Try otc Biotene mouth rinse for dry mouth- rinse your mouth with it before bed and see if the dry feeling in the morning is better.   Please call if we can help

## 2019-04-17 NOTE — Assessment & Plan Note (Signed)
CPAP is at top of pressure range and not controlling optimally because of his weight. Plan - CPAP to BIPAP titration sleep study. He will also considering using a shelf to hold his machine.

## 2019-04-17 NOTE — Assessment & Plan Note (Addendum)
He has not been able to change his life style to lose weight. Multiple derived comorbidities. Failed lap-band 2015 Question Bariatric referral.

## 2019-04-21 ENCOUNTER — Other Ambulatory Visit: Payer: Self-pay | Admitting: Internal Medicine

## 2019-04-21 DIAGNOSIS — K2100 Gastro-esophageal reflux disease with esophagitis, without bleeding: Secondary | ICD-10-CM

## 2019-05-01 ENCOUNTER — Other Ambulatory Visit (HOSPITAL_COMMUNITY)
Admission: RE | Admit: 2019-05-01 | Discharge: 2019-05-01 | Disposition: A | Discharge status: Home / Self Care | Payer: Medicare Other | Source: Ambulatory Visit | Attending: Internal Medicine | Admitting: Internal Medicine

## 2019-05-01 DIAGNOSIS — Z01812 Encounter for preprocedural laboratory examination: Secondary | ICD-10-CM | POA: Insufficient documentation

## 2019-05-01 DIAGNOSIS — Z20822 Contact with and (suspected) exposure to covid-19: Secondary | ICD-10-CM | POA: Diagnosis not present

## 2019-05-01 LAB — SARS CORONAVIRUS 2 (TAT 6-24 HRS): SARS Coronavirus 2: NEGATIVE

## 2019-05-03 ENCOUNTER — Ambulatory Visit: Payer: Self-pay

## 2019-05-04 ENCOUNTER — Other Ambulatory Visit: Payer: Self-pay

## 2019-05-04 ENCOUNTER — Ambulatory Visit (HOSPITAL_BASED_OUTPATIENT_CLINIC_OR_DEPARTMENT_OTHER): Payer: Medicare Other | Attending: Internal Medicine | Admitting: Internal Medicine

## 2019-05-04 VITALS — Ht 72.0 in | Wt 384.0 lb

## 2019-05-04 DIAGNOSIS — G4733 Obstructive sleep apnea (adult) (pediatric): Secondary | ICD-10-CM | POA: Insufficient documentation

## 2019-05-08 ENCOUNTER — Ambulatory Visit: Payer: Self-pay

## 2019-05-10 ENCOUNTER — Ambulatory Visit: Payer: Medicare Other | Attending: Internal Medicine

## 2019-05-10 DIAGNOSIS — Z23 Encounter for immunization: Secondary | ICD-10-CM | POA: Insufficient documentation

## 2019-05-10 NOTE — Progress Notes (Signed)
   Covid-19 Vaccination Clinic  Name:  [name]    MRN: [id number] DOB: 1946/02/02  05/10/2019  [name] was observed post Covid-19 immunization for 30 minutes based on pre-vaccination screening without incidence. He was provided with Vaccine Information Sheet and instruction to access the V-Safe system.   [name] was instructed to call 911 with any severe reactions post vaccine: [name] Difficulty breathing  . Swelling of your face and throat  . A fast heartbeat  . A bad rash all over your body  . Dizziness and weakness    Immunizations Administered    Name Date Dose VIS Date Route   Pfizer COVID-19 Vaccine 05/10/2019  5:49 PM 0.3 mL 03/14/2019 Intramuscular   Manufacturer: [place]   Lot: [id number]   [place]: [id number]

## 2019-05-11 ENCOUNTER — Other Ambulatory Visit: Payer: Self-pay | Admitting: Internal Medicine

## 2019-05-11 ENCOUNTER — Other Ambulatory Visit: Payer: Self-pay | Admitting: Physical Medicine & Rehabilitation

## 2019-05-11 DIAGNOSIS — G4733 Obstructive sleep apnea (adult) (pediatric): Secondary | ICD-10-CM

## 2019-05-11 DIAGNOSIS — J309 Allergic rhinitis, unspecified: Secondary | ICD-10-CM

## 2019-05-11 DIAGNOSIS — E785 Hyperlipidemia, unspecified: Secondary | ICD-10-CM

## 2019-05-11 DIAGNOSIS — H1010 Acute atopic conjunctivitis, unspecified eye: Secondary | ICD-10-CM

## 2019-05-11 NOTE — Procedures (Signed)
     Patient Name: [name], [name] Date: 05/04/2019 Gender: Male D.O.B: 11-06-1945 Age (years): 67 Referring Provider: Baird Lyons MD, ABSM Height (inches): 72 Interpreting Physician: Baird Lyons MD, ABSM Weight (lbs): 384 RPSGT: Gwenyth Allegra BMI: 52 MRN: [id number] Neck Size: 20.50  CLINICAL INFORMATION The patient is referred for a BiPAP titration to treat sleep apnea.  Date of NPSG, Split Night or HST: NPSG 10/25/2005   AHI 79/ hr, desaturation to 80%,  SLEEP STUDY TECHNIQUE As per the AASM Manual for the Scoring of Sleep and Associated Events v2.3 (April 2016) with a hypopnea requiring 4% desaturations.  The channels recorded and monitored were frontal, central and occipital EEG, electrooculogram (EOG), submentalis EMG (chin), nasal and oral airflow, thoracic and abdominal wall motion, anterior tibialis EMG, snore microphone, electrocardiogram, and pulse oximetry. Bilevel positive airway pressure (BPAP) was initiated at the beginning of the study and titrated to treat sleep-disordered breathing.  MEDICATIONS Medications self-administered by patient taken the night of the study : none reported  RESPIRATORY PARAMETERS Optimal IPAP Pressure (cm): 26 AHI at Optimal Pressure (/hr) 1.8 Optimal EPAP Pressure (cm): 22   Overall Minimal O2 (%): 78.0 Minimal O2 at Optimal Pressure (%): 90.0 SLEEP ARCHITECTURE Start Time: 10:08:56 PM Stop Time: 4:26:02 AM Total Time (min): 377.1 Total Sleep Time (min): 293.5 Sleep Latency (min): 8.4 Sleep Efficiency (%): 77.8% REM Latency (min): 289.0 WASO (min): 75.2 Stage N1 (%): 21.3% Stage N2 (%): 77.9% Stage N3 (%): 0.0% Stage R (%): 0.9 Supine (%): 5.11 Arousal Index (/hr): 14.5   CARDIAC DATA The 2 lead EKG demonstrated sinus rhythm. The mean heart rate was 58.1 beats per minute. Other EKG findings include: None.  LEG MOVEMENT DATA The total Periodic Limb Movements of Sleep (PLMS) were 0. The PLMS index was 0.0. A PLMS index of  <15 is considered normal in adults.  IMPRESSIONS - CPAP titrated to 20 cwp was unable to control apneas and titration waas changed to Bilevel (BIPAP). - An optimal BiPAP pressure was selected for this patient ( 26 / 22 cm of water) - Central sleep apnea was not noted during this titration (CAI = 0.6/h). - Severe oxygen desaturations were observed during this titration (min O2 = 78.0%). Min sat on BIPAP 26/22 was 90%. - The patient snored with moderate snoring volume. - No cardiac abnormalities were observed during this study. - Clinically significant periodic limb movements were not noted during this study.   DIAGNOSIS - Obstructive Sleep Apnea (327.23 [G47.33 ICD-10])  RECOMMENDATIONS - Trial of BiPAP therapy on 26/22 cm H2O with a Large size Fisher&Paykel Full Face Mask Simplus mask and heated humidification. - Be careful with alcohol, sedatives and other CNS depressants that may worsen sleep apnea and disrupt normal sleep architecture. - Sleep hygiene should be reviewed to assess factors that may improve sleep quality. - Weight management and regular exercise should be initiated or continued.  [Electronically signed] 05/11/2019 11:40 AM  Baird Lyons MD, ABSM Diplomate, American Board of Sleep Medicine   NPI: [id number]                        [place] [place] [facility]  ELECTRONICALLY SIGNED ON:  05/11/2019, 11:32 AM [place] PH: [phone]   FX: [phone] [place]

## 2019-05-12 ENCOUNTER — Encounter (HOSPITAL_COMMUNITY): Payer: Self-pay | Admitting: Psychiatry

## 2019-05-12 ENCOUNTER — Ambulatory Visit (INDEPENDENT_AMBULATORY_CARE_PROVIDER_SITE_OTHER): Payer: Medicare Other | Admitting: Psychiatry

## 2019-05-12 ENCOUNTER — Other Ambulatory Visit: Payer: Self-pay

## 2019-05-12 DIAGNOSIS — F330 Major depressive disorder, recurrent, mild: Secondary | ICD-10-CM | POA: Diagnosis not present

## 2019-05-12 DIAGNOSIS — F419 Anxiety disorder, unspecified: Secondary | ICD-10-CM | POA: Diagnosis not present

## 2019-05-12 MED ORDER — LORAZEPAM 0.5 MG PO TABS: 0.5000 mg | ORAL_TABLET | Freq: Every day | ORAL | 1 refills | Status: DC

## 2019-05-12 MED ORDER — SERTRALINE HCL 100 MG PO TABS: 150.0000 mg | ORAL_TABLET | Freq: Every day | ORAL | 1 refills | Status: DC

## 2019-05-12 NOTE — Progress Notes (Signed)
Virtual Visit via Telephone Note  I connected with [name] on 05/12/19 at  2:00 PM EST by telephone and verified that I am speaking with the correct person using two identifiers.   I discussed the limitations, risks, security and privacy concerns of performing an evaluation and management service by telephone and the availability of in person appointments. I also discussed with the patient that there may be a patient responsible charge related to this service. The patient expressed understanding and agreed to proceed.   History of Present Illness: Patient was evaluated through phone session.  He has been taking his medication as prescribed.  He is sleeping fairly okay other than some time he feels did not get enough sleep.  He is using CPAP.  Patient has multiple health issues.  After the stroke sometimes he has difficulty keeping his balance.  He had a good Christmas.  He spent time with his 43-year-old granddaughter and he always enjoyed the company of the granddaughter.  He lives with his wife who is supportive.  Patient denies any crying spells or any feeling of hopelessness or worthlessness.  Denies any suicidal thoughts.  So far he is tolerating his medication but also hoping that he can lose some weight.  Denies any tremors or shakes.  He like to keep his current psychotropic medication which is helping his anxiety and depression.   Past Psychiatric History:Reviewed H/Oinpatient in 2058fordepression and having suicidal thoughts. TookRisperdal but d/c after stroke in March 2018.    Psychiatric Specialty Exam: Physical Exam  Review of Systems  There were no vitals taken for this visit.There is no height or weight on file to calculate BMI.  General Appearance: NA  Eye Contact:  NA  Speech:  Clear and Coherent and Normal Rate  Volume:  Normal  Mood:  Euthymic  Affect:  NA  Thought Process:  Goal Directed  Orientation:  Full (Time, Place, and Person)  Thought Content:   Logical  Suicidal Thoughts:  No  Homicidal Thoughts:  No  Memory:  Immediate;   Good Recent;   Good Remote;   Good  Judgement:  Good  Insight:  Good  Psychomotor Activity:  NA  Concentration:  Concentration: Fair and Attention Span: Fair  Recall:  Good  Fund of Knowledge:  Good  Language:  Good  Akathisia:  No  Handed:  Right  AIMS (if indicated):     Assets:  Communication Skills Desire for Improvement Housing Resilience Social Support  ADL's:  Intact  Cognition:  WNL  Sleep:   fair      Assessment and Plan: Major depressive disorder, recurrent.  Anxiety.  Patient is a stable on his current medication.  Continue Zoloft 150 mg daily and lorazepam 0.5 mg at bedtime.  Discussed medication side effects and benefits.  Recommended to call us back if he has any question of any concern.  Follow-up in 6 months.  Follow Up Instructions:    I discussed the assessment and treatment plan with the patient. The patient was provided an opportunity to ask questions and all were answered. The patient agreed with the plan and demonstrated an understanding of the instructions.   The patient was advised to call back or seek an in-person evaluation if the symptoms worsen or if the condition fails to improve as anticipated.  I provided 20 minutes of non-face-to-face time during this encounter.   Kathlee Nations, MD

## 2019-05-14 ENCOUNTER — Other Ambulatory Visit: Payer: Self-pay | Admitting: Internal Medicine

## 2019-05-14 LAB — HM DIABETES EYE EXAM

## 2019-05-14 MED ORDER — TORSEMIDE 20 MG PO TABS: 60.0000 mg | ORAL_TABLET | Freq: Every day | ORAL | 0 refills | Status: DC

## 2019-05-14 NOTE — Telephone Encounter (Signed)
New Message      *STAT* If patient is at the pharmacy, call can be transferred to refill team.   1. Which medications need to be refilled? (please list name of each medication and dose if known) torsemide (DEMADEX) 20 MG tablet  2. Which pharmacy/location (including street and city if local pharmacy) is medication to be sent to? [place], [address]  3. Do they need a 30 day or 90 day supply? 30 or 90

## 2019-05-27 DIAGNOSIS — H2513 Age-related nuclear cataract, bilateral: Secondary | ICD-10-CM | POA: Diagnosis not present

## 2019-05-27 DIAGNOSIS — H52203 Unspecified astigmatism, bilateral: Secondary | ICD-10-CM | POA: Diagnosis not present

## 2019-05-29 DIAGNOSIS — R633 Feeding difficulties: Secondary | ICD-10-CM | POA: Diagnosis not present

## 2019-05-29 DIAGNOSIS — R1310 Dysphagia, unspecified: Secondary | ICD-10-CM | POA: Diagnosis not present

## 2019-05-29 DIAGNOSIS — J3801 Paralysis of vocal cords and larynx, unilateral: Secondary | ICD-10-CM | POA: Diagnosis not present

## 2019-05-29 DIAGNOSIS — J3800 Paralysis of vocal cords and larynx, unspecified: Secondary | ICD-10-CM | POA: Diagnosis not present

## 2019-05-29 DIAGNOSIS — G463 Brain stem stroke syndrome: Secondary | ICD-10-CM | POA: Diagnosis not present

## 2019-05-29 DIAGNOSIS — R1313 Dysphagia, pharyngeal phase: Secondary | ICD-10-CM | POA: Diagnosis not present

## 2019-05-29 DIAGNOSIS — R490 Dysphonia: Secondary | ICD-10-CM | POA: Diagnosis not present

## 2019-05-30 NOTE — Progress Notes (Signed)
Order- Based on CPAP to BIPAP titration sleep study Jan 31, we found that CPAP doesn't control his apnea well enough. Please ask DME [facility] to change CPAP to BIPAP 25/22, PS2 Have him keep scheduled ov f/u.

## 2019-06-03 ENCOUNTER — Telehealth (INDEPENDENT_AMBULATORY_CARE_PROVIDER_SITE_OTHER): Payer: Medicare Other | Admitting: Internal Medicine

## 2019-06-03 ENCOUNTER — Encounter: Payer: Self-pay | Admitting: Internal Medicine

## 2019-06-03 ENCOUNTER — Other Ambulatory Visit: Payer: Self-pay | Admitting: Internal Medicine

## 2019-06-03 VITALS — HR 51 | Ht 72.0 in | Wt 386.0 lb

## 2019-06-03 DIAGNOSIS — I69398 Other sequelae of cerebral infarction: Secondary | ICD-10-CM | POA: Diagnosis not present

## 2019-06-03 DIAGNOSIS — R0600 Dyspnea, unspecified: Secondary | ICD-10-CM

## 2019-06-03 DIAGNOSIS — Z6841 Body Mass Index (BMI) 40.0 and over, adult: Secondary | ICD-10-CM

## 2019-06-03 DIAGNOSIS — I480 Paroxysmal atrial fibrillation: Secondary | ICD-10-CM | POA: Diagnosis not present

## 2019-06-03 DIAGNOSIS — J3801 Paralysis of vocal cords and larynx, unilateral: Secondary | ICD-10-CM | POA: Diagnosis not present

## 2019-06-03 DIAGNOSIS — Z86711 Personal history of pulmonary embolism: Secondary | ICD-10-CM | POA: Diagnosis not present

## 2019-06-03 DIAGNOSIS — I5032 Chronic diastolic (congestive) heart failure: Secondary | ICD-10-CM

## 2019-06-03 DIAGNOSIS — R0609 Other forms of dyspnea: Secondary | ICD-10-CM

## 2019-06-03 DIAGNOSIS — Z9989 Dependence on other enabling machines and devices: Secondary | ICD-10-CM

## 2019-06-03 DIAGNOSIS — E785 Hyperlipidemia, unspecified: Secondary | ICD-10-CM

## 2019-06-03 DIAGNOSIS — G4733 Obstructive sleep apnea (adult) (pediatric): Secondary | ICD-10-CM | POA: Diagnosis not present

## 2019-06-03 DIAGNOSIS — Z9884 Bariatric surgery status: Secondary | ICD-10-CM

## 2019-06-03 DIAGNOSIS — I10 Essential (primary) hypertension: Secondary | ICD-10-CM | POA: Diagnosis not present

## 2019-06-03 MED ORDER — FLECAINIDE ACETATE 50 MG PO TABS: 50.0000 mg | ORAL_TABLET | Freq: Two times a day (BID) | ORAL | 3 refills | Status: DC

## 2019-06-03 NOTE — Patient Instructions (Signed)
Medication Instructions:  Your physician recommends that you continue on your current medications as directed. Please refer to the Current Medication list given to you today.  *If you need a refill on your cardiac medications before your next appointment, please call your pharmacy*    Follow-Up: At [facility], you and your health needs are our priority.  As part of our continuing mission to provide you with exceptional heart care, we have created designated Provider Care Teams.  These Care Teams include your primary Cardiologist (physician) and Advanced Practice Providers (APPs -  Physician Assistants and Nurse Practitioners) who all work together to provide you with the care you need, when you need it.  We recommend signing up for the patient portal called "MyChart".  Sign up information is provided on this After Visit Summary.  MyChart is used to connect with patients for Virtual Visits (Telemedicine).  Patients are able to view lab/test results, encounter notes, upcoming appointments, etc.  Non-urgent messages can be sent to your provider as well.   To learn more about what you can do with MyChart, go to [email or web address].    Your next appointment:   6 month(s)  The format for your next appointment:   In Person  Provider:   You may see Pixie Casino, MD or one of the following Advanced Practice Providers on your designated Care Team:    Almyra Deforest, PA-C  Fabian Sharp, [place] or   Roby Lofts, [place]   --Please call our office 2 months in advance to schedule your follow-up appointment.

## 2019-06-03 NOTE — Progress Notes (Signed)
Virtual Visit via Telephone Note   This visit type was conducted due to national recommendations for restrictions regarding the COVID-19 Pandemic (e.g. social distancing) in an effort to limit this patient's exposure and mitigate transmission in our community.  Due to his co-morbid illnesses, this patient is at least at moderate risk for complications without adequate follow up.  This format is felt to be most appropriate for this patient at this time.  The patient did not have access to video technology/had technical difficulties with video requiring transitioning to audio format only (telephone).  All issues noted in this document were discussed and addressed.  No physical exam could be performed with this format.  Please refer to the patient's chart for his  consent to telehealth for [facility].   Evaluation Performed:  Telephone follow-up  Date:  06/03/2019   ID:  [name], DOB 1945-12-02, MRN [id number]  Patient Location:  [place] [address]  Provider location:   [address] [place], [place] [address]  PCP:  Janith Lima, MD  Cardiologist:  Pixie Casino, MD Electrophysiologist:  None   Chief Complaint:  Shortness of breath  History of Present Illness:    [name] is a 74 y.o. male who presents via audio/video conferencing for a telehealth visit today.  [name] was seen today via telephone follow-up.  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  He did have surgery and has had some improvement in his voice.  He still has issues with dizziness and postural changes.  He has gained significant weight back.  At one point after bariatric surgery in 2015 he had lost down to 290 pounds however now back to 390 pounds.  He gets short of breath with very minimal exertion such as walking across the room.  He does monitor oxygen levels.  He has been working with the sleep  center.  He denies any significant recurrent atrial fibrillation.  He is on flecainide.  The patient does not have symptoms concerning for COVID-19 infection (fever, chills, cough, or new SHORTNESS OF BREATH).    Prior CV studies:   The following studies were reviewed today:  Chart reviewed  PMHx:  Past Medical History:  Diagnosis Date   Anemia 07/29/2013   Asthma    childhood asthma   Benign essential HTN    Benign prostatic hypertrophy    several prostate biopsies neg for cancer.    Bilateral hip pain    Cancer (HCC)    CHF (congestive heart failure) (HCC)    Chronic anticoagulation [id number]   Complication of anesthesia    MORPHINE CAUSES HALLUCINATIONS   CVA (cerebral vascular accident) ([place]) 06/2016   Depression    Diastolic congestive heart failure ([place]) 07/16/2013   Dysphagia, post-stroke    Embolic cerebral infarction ([place]) 06/21/2016   FH: colonic polyps 2010   Gout    History of kidney stones    Hypertension    Kidney cysts    Morbid obesity ([place])    Numbness    feet / legs   PAF (paroxysmal atrial fibrillation) with RVR 07/29/2013   Pericarditis 2015   s/p window   Recurrent pulmonary emboli ([place]) 06/13/2015   Sleep apnea    USES C PAP   Stroke syndrome    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    Past Surgical History:  Procedure Laterality Date   ARTHROSCOPY KNEE W/ DRILLING  BREATH TEK H PYLORI N/A 12/23/2012   Procedure: [place];  Surgeon: Gayland Curry, MD;  Location: [facility];  Service: General;  Laterality: N/A;   CHOLECYSTECTOMY  1989   COLONOSCOPY N/A 07/31/2013   Procedure: COLONOSCOPY;  Surgeon: Gatha Mayer, MD;  Location: [facility];  Service: Endoscopy;  Laterality: N/A;   ESOPHAGOGASTRODUODENOSCOPY N/A 07/22/2013   Procedure: ESOPHAGOGASTRODUODENOSCOPY (EGD);  Surgeon: Irene Shipper, MD;  Location: [facility];  Service: Endoscopy;  Laterality: N/A;   ESOPHAGOGASTRODUODENOSCOPY N/A 07/31/2013    Procedure: ESOPHAGOGASTRODUODENOSCOPY (EGD);  Surgeon: Gatha Mayer, MD;  Location: [facility] ENDOSCOPY;  Service: Endoscopy;  Laterality: N/A;   INTRAOPERATIVE TRANSESOPHAGEAL ECHOCARDIOGRAM N/A 07/18/2013   Procedure: INTRAOPERATIVE TRANSESOPHAGEAL ECHOCARDIOGRAM;  Surgeon: Grace Isaac, MD;  Location: [facility];  Service: Open Heart Surgery;  Laterality: N/A;   LAPAROSCOPIC GASTRIC BANDING WITH HIATAL HERNIA REPAIR N/A 04/08/2013   Procedure: LAPAROSCOPIC GASTRIC BANDING WITH possible  HIATAL HERNIA REPAIR;  Surgeon: Gayland Curry, MD;  Location: [facility];  Service: General;  Laterality: N/A;   LITHOTRIPSY     renal calculi     SUBXYPHOID PERICARDIAL WINDOW N/A 07/18/2013   Procedure: SUBXYPHOID PERICARDIAL WINDOW;  Surgeon: Grace Isaac, MD;  Location: [facility] OR;  Service: Thoracic;  Laterality: N/A;   TONSILLECTOMY     total knee replacment     bilat   trigger finger repair     also lue, cts surgically repaired   WISDOM TOOTH EXTRACTION      FAMHx:  Family History  Problem Relation Age of Onset   Depression Mother    Hypertension Mother    Dementia Mother    Heart disease Father        MI   Depression Brother    Stroke Paternal Aunt        CVA   Diabetes Paternal Uncle    Heart disease Paternal Uncle        MI   Heart disease Paternal Grandmother        MI   Diabetes Cousin        PATERNAL    SOCHx:   reports that he quit smoking about 44 years ago. His smoking use included cigarettes. He started smoking about 58 years ago. He has a 2.60 pack-year smoking history. He has never used smokeless tobacco. He reports that he does not drink alcohol or use drugs.  ALLERGIES:  Allergies  Allergen Reactions   Morphine Nausea And Vomiting and Other (See Comments)    Severe hallucinations   Codeine Nausea Only   Penicillins Other (See Comments)    Passed out after injection Did it involve swelling of the face/tongue/throat, SOB, or low BP? Unknown Did it involve  sudden or severe rash/hives, skin peeling, or any reaction on the inside of your mouth or nose? No Did you need to seek medical attention at a hospital or doctor's office? Yes When did it last happen?teenager If all above answers are NO, may proceed with cephalosporin use.   Propoxyphene N-Acetaminophen Nausea Only    MEDS:  Current Meds  Medication Sig   apixaban (ELIQUIS) 5 MG TABS tablet Take 1 tablet (5 mg total) by mouth 2 (two) times daily.   atorvastatin (LIPITOR) 80 MG tablet Take 1 tablet by mouth once daily   Cyanocobalamin (VITAMIN B-12 PO) Take 1 tablet by mouth at bedtime.   Docusate Sodium (DSS) 100 MG CAPS Take 100 mg by mouth every other day.   finasteride (  PROSCAR) 5 MG tablet Take 1 tablet (5 mg total) by mouth daily.   flecainide (TAMBOCOR) 50 MG tablet Take 1 tablet (50 mg total) by mouth every 12 (twelve) hours. OV NEEED   HYDROcodone-acetaminophen (NORCO/VICODIN) 5-325 MG tablet Take 1 tablet by mouth every 6 (six) hours as needed.   lidocaine (LIDODERM) 5 % Place 1 patch onto the skin daily. Remove & Discard patch within 12 hours or as directed by MD   loratadine (CLARITIN) 10 MG tablet Take 10 mg by mouth daily.   LORazepam (ATIVAN) 0.5 MG tablet Take 1 tablet (0.5 mg total) by mouth at bedtime.   montelukast (SINGULAIR) 10 MG tablet TAKE 1 TABLET BY MOUTH AT BEDTIME   OVER THE COUNTER MEDICATION Take 1 tablet by mouth daily. Multi vitamin with iron   pantoprazole (PROTONIX) 40 MG tablet Take 1 tablet by mouth once daily   potassium chloride SA (K-DUR) 20 MEQ tablet Take 1 tablet (20 mEq total) by mouth 2 (two) times daily. Take one table (20 meq) by mouth twice daily - afternoon and bedtime   rOPINIRole (REQUIP) 0.5 MG tablet Take 1 tablet (0.5 mg total) by mouth 3 (three) times daily. (Patient taking differently: Take 1.5 mg by mouth at bedtime. )   sertraline (ZOLOFT) 100 MG tablet Take 1.5 tablets (150 mg total) by mouth at bedtime.    torsemide (DEMADEX) 20 MG tablet Take 3 tablets (60 mg total) by mouth daily. Keep appointment as scheduled.   traMADol (ULTRAM) 50 MG tablet TAKE 1 TABLET BY MOUTH EVERY 8 HOURS AS NEEDED   [DISCONTINUED] flecainide (TAMBOCOR) 50 MG tablet Take 1 tablet (50 mg total) by mouth every 12 (twelve) hours. OV NEEED     ROS: Pertinent items noted in HPI and remainder of comprehensive ROS otherwise negative.  Labs/Other Tests and Data Reviewed:    Recent Labs: 10/14/2018: BUN 21; Creatinine, Ser 1.30; Hemoglobin 13.9; Platelets 212; Potassium 3.5; Sodium 140   Recent Lipid Panel Lab Results  Component Value Date/Time   CHOL 139 05/16/2018 04:16 PM   TRIG 203.0 (H) 05/16/2018 04:16 PM   HDL 29.70 (L) 05/16/2018 04:16 PM   CHOLHDL 5 05/16/2018 04:16 PM   LDLCALC 77 06/19/2016 07:30 AM   LDLDIRECT 91.0 05/16/2018 04:16 PM    Wt Readings from Last 3 Encounters:  06/03/19 (!) 386 lb (175.1 kg)  05/04/19 (!) 384 lb (174.2 kg)  04/17/19 (!) 385 lb 3.2 oz (174.7 kg)     Exam:    Vital Signs:  Pulse (!) 51    Ht 6' (1.829 m)    Wt (!) 386 lb (175.1 kg)    SpO2 98%    BMI 52.35 kg/m    Exam not performed due to telephone visit  ASSESSMENT & PLAN:    1. Persistent dyspnea on exertion 2. Recent stroke with partial vocal cord paralysis 3. Paroxysmal atrial fibrillation on flecainide 4. Hyperlipidemia 5. Hypertension 6. Recent history of pericarditis/pericardial effusion status post pericardial window 7. Pulmonary embolus 8. Morbid obesity 9. Obstructive sleep apnea on CPAP  [name] continues to have persistent dyspnea on exertion.  I think this is primarily due to morbid obesity as well as some degree of chronic diastolic dysfunction.  His A. fib has been controlled without recurrent symptoms on flecainide.  We will refill that today.  Blood pressure was not assessed however he says generally is well controlled at home.  He is on CPAP and that has been followed up recently.  He  also  had vocal cord surgery with improvement in his voice.  COVID-19 Education: The signs and symptoms of COVID-19 were discussed with the patient and how to seek care for testing (follow up with PCP or arrange E-visit).  The importance of social distancing was discussed today.  Patient Risk:   After full review of this patients clinical status, I feel that they are at least moderate risk at this time.  Time:   Today, I have spent 25 minutes with the patient with telehealth technology discussing dyspnea, diastolic heart failure, PAF, hypertension, dyslipidemia, morbid obesity and weight loss.     Medication Adjustments/Labs and Tests Ordered: Current medicines are reviewed at length with the patient today.  Concerns regarding medicines are outlined above.   Tests Ordered: No orders of the defined types were placed in this encounter.   Medication Changes: Meds ordered this encounter  Medications   flecainide (TAMBOCOR) 50 MG tablet    Sig: Take 1 tablet (50 mg total) by mouth every 12 (twelve) hours. OV NEEED    Dispense:  180 tablet    Refill:  3    Disposition:  in 6 month(s)  Pixie Casino, MD, [facility], [place] Director of the [facility] Lipid Disorders &  Cardiovascular Risk Reduction Clinic Diplomate of the American Board of Clinical Lipidology Attending Cardiologist  Direct Dial: [phone]   Fax: [phone]  Website:  [email or web address]  Pixie Casino, MD  06/03/2019 9:52 AM

## 2019-06-04 ENCOUNTER — Ambulatory Visit: Payer: Medicare Other | Attending: Internal Medicine

## 2019-06-04 DIAGNOSIS — Z23 Encounter for immunization: Secondary | ICD-10-CM | POA: Insufficient documentation

## 2019-06-04 NOTE — Progress Notes (Signed)
   Covid-19 Vaccination Clinic  Name:  [name]    MRN: [id number] DOB: Dec 16, 1945  06/04/2019  [name] was observed post Covid-19 immunization for 30 minutes based on pre-vaccination screening without incident. He was provided with Vaccine Information Sheet and instruction to access the V-Safe system.   [name] was instructed to call 911 with any severe reactions post vaccine: [name] Difficulty breathing  . Swelling of face and throat  . A fast heartbeat  . A bad rash all over body  . Dizziness and weakness   Immunizations Administered    Name Date Dose VIS Date Route   Pfizer COVID-19 Vaccine 06/04/2019  8:55 AM 0.3 mL 03/14/2019 Intramuscular   Manufacturer: [place]   Lot: [id number]   [place]: [id number]

## 2019-06-06 ENCOUNTER — Other Ambulatory Visit: Payer: Self-pay

## 2019-06-06 ENCOUNTER — Telehealth: Payer: Self-pay

## 2019-06-06 DIAGNOSIS — G4733 Obstructive sleep apnea (adult) (pediatric): Secondary | ICD-10-CM

## 2019-06-06 NOTE — Telephone Encounter (Signed)
Spoke with patient regarding his sleep study.  Order- Based on CPAP to BIPAP titration sleep study Jan 31, we found that CPAP doesn't control his apnea well enough. Please ask DME [facility] to change CPAP to BIPAP 25/22, PS2 Have him keep scheduled ov f/u. Put the order in for his [id number]. Patient voice was understanding. Nothing else further needed.

## 2019-06-12 ENCOUNTER — Telehealth: Payer: Self-pay | Admitting: Internal Medicine

## 2019-06-12 DIAGNOSIS — N401 Enlarged prostate with lower urinary tract symptoms: Secondary | ICD-10-CM

## 2019-06-12 NOTE — Chronic Care Management (AMB) (Signed)
°  Chronic Care Management   Note  06/12/2019 Name: MARQUIL MINE MRN: [id number] DOB: 04-14-1945  [name] is a 74 y.o. year old male who is a primary care patient of Janith Lima, MD. I reached out to [name] by phone today in response to a referral sent by Mr. Tho Radice Hoaglin's PCP, Janith Lima, MD. Wife, [name] gave verbal consent for CCM.  Mr. Mcphearson was given information about Chronic Care Management services today including:  1. CCM service includes personalized support from designated clinical staff supervised by his physician, including individualized plan of care and coordination with other care providers 2. 24/7 contact phone numbers for assistance for urgent and routine care needs. 3. Service will only be billed when office clinical staff spend 20 minutes or more in a month to coordinate care. 4. Only one practitioner may furnish and bill the service in a calendar month. 5. The patient may stop CCM services at any time (effective at the end of the month) by phone call to the office staff.   Patient agreed to services and verbal consent obtained.   Follow up plan:   Raynicia Dukes [employer] Scheduler

## 2019-07-01 ENCOUNTER — Telehealth: Payer: Self-pay | Admitting: *Deleted

## 2019-07-01 NOTE — Telephone Encounter (Signed)
I spoke with pt and he states the ankle did not get any relief and he is now continuing to have pain. I informed him that it would be difficult to say for certain due to the time lapse, but he may have had a flare of the symptoms and if he was still having pain he would benefit from seeing one of our ankle specialist. Pt agreed and I transferred to schedulers.

## 2019-07-01 NOTE — Telephone Encounter (Signed)
Pt states he was seen twice by Dr. Paulla Dolly and received a injection in the bottom of his foot and one in the ankle, the ankle is still painful.

## 2019-07-02 ENCOUNTER — Telehealth: Payer: Medicare Other

## 2019-07-09 ENCOUNTER — Other Ambulatory Visit: Payer: Self-pay | Admitting: Physician Assistant

## 2019-07-09 NOTE — Telephone Encounter (Signed)
This is Dr. [name]'s pt 

## 2019-07-09 NOTE — Telephone Encounter (Signed)
Rx request sent to pharmacy.  

## 2019-07-13 NOTE — Chronic Care Management (AMB) (Signed)
Chronic Care Management Pharmacy  Name: [name]  MRN: [id number] DOB: Jan 09, 1946   Chief Complaint/ HPI  [name],  74 y.o. , male presents for their Initial CCM visit with the clinical pharmacist via telephone due to COVID-19 Pandemic.  PCP : Janith Lima, MD  Their chronic conditions include: Afib, Recurrent PE, embolic CVA, HTN, HLD, [id number], GERD  Stroke 2018, balance is off. Walks with cane/walker - LLE pain and problems. Lower back pain (Dr Ella Bodo - lumbar injections)  Bariatric band surgery Jan 2015. Lost 100 lbs in 2014-2015, had many health issues later in 2015.   Office Visits: 02/07/19 Dr Jenny Reichmann: acute calf pain x 3 days, r/o DVT, likely MSK. Rx'd Norco prn.  11/14/18 Dr Ronnald Ramp OV: acute foot pain, likely plantar fasciitis, referred to podiatry. A1c up to 6.5, new onset DM, no meds indicated yet.  Consult Visit: 06/03/19 Dr Debara Pickett (cardiology): persistent DOE, 2/2 morbid obesity and diastolic dysfunction. Afib controlled.   05/29/19 [facility] ENT: MBS normal, voice and swallowing stable.  05/12/19 Dr Adele Schilder (psych): pt stable, no med changes.  05/04/19 sleep study: OSA uncontrolled w/ current CPAP. Changed to BiPAP.   04/17/19 Dr Annamaria Boots (pulmonary): CPAP not doing enough, scheduled BiPAP sleep study.  10/2018: ED visit for back pain.   Medications: Outpatient Encounter Medications as of 07/14/2019  Medication Sig  . apixaban (ELIQUIS) 5 MG TABS tablet Take 1 tablet (5 mg total) by mouth 2 (two) times daily.  [name] atorvastatin (LIPITOR) 80 MG tablet Take 1 tablet by mouth once daily  . Cyanocobalamin (VITAMIN B-12 PO) Take 1 tablet by mouth at bedtime.  [name] Sodium (DSS) 100 MG CAPS Take 100 mg by mouth every other day.  . finasteride (PROSCAR) 5 MG tablet Take 1 tablet (5 mg total) by mouth daily.  . flecainide (TAMBOCOR) 50 MG tablet Take 1 tablet (50 mg total) by mouth every 12 (twelve) hours. OV NEEED  . lidocaine (LIDODERM) 5 % Place 1 patch onto the  skin daily. Remove & Discard patch within 12 hours or as directed by MD  . loratadine (CLARITIN) 10 MG tablet Take 10 mg by mouth daily.  [name] LORazepam (ATIVAN) 0.5 MG tablet Take 1 tablet (0.5 mg total) by mouth at bedtime.  . montelukast (SINGULAIR) 10 MG tablet TAKE 1 TABLET BY MOUTH AT BEDTIME  . OVER THE COUNTER MEDICATION Take 1 tablet by mouth daily. Multi vitamin with iron  . pantoprazole (PROTONIX) 40 MG tablet Take 1 tablet by mouth once daily  . potassium chloride SA (KLOR-CON) 20 MEQ tablet TAKE 1  BY MOUTH TWICE DAILY **AFTERNOON  AND  BEDTIME** (Patient taking differently: Take 40 mEq by mouth at bedtime. )  . rOPINIRole (REQUIP) 0.5 MG tablet Take 1 tablet (0.5 mg total) by mouth 3 (three) times daily. (Patient taking differently: Take 1.5 mg by mouth at bedtime. )  . sertraline (ZOLOFT) 100 MG tablet Take 1.5 tablets (150 mg total) by mouth at bedtime.  . torsemide (DEMADEX) 20 MG tablet TAKE 3 TABLETS BY MOUTH ONCE DAILY. KEEP APPOINTMENT AS SCHEDULED  . traMADol (ULTRAM) 50 MG tablet TAKE 1 TABLET BY MOUTH EVERY 8 HOURS AS NEEDED  . HYDROcodone-acetaminophen (NORCO/VICODIN) 5-325 MG tablet Take 1 tablet by mouth every 6 (six) hours as needed. (Patient not taking: Reported on 07/14/2019)  . [DISCONTINUED] atorvastatin (LIPITOR) 80 MG tablet Take 1 tablet (80 mg total) by mouth daily.  . [DISCONTINUED] montelukast (SINGULAIR) 10 MG tablet Take 1 tablet (  10 mg total) by mouth at bedtime.   No facility-administered encounter medications on file as of 07/14/2019.     Current Diagnosis/Assessment:  Goals Addressed            This Visit's Progress   . Medication assistance       CARE PLAN ENTRY (see longitudinal plan of care for additional care plan information)  Current Barriers:  . Financial Barriers: patient has [facility] PDP insurance and reports copay for Eliquis is cost prohibitive at this time  Pharmacist Clinical Goal(s):  [name] Over the next 30 days, patient will work  with PharmD and providers to relieve medication access concerns  Interventions: . Comprehensive medication review completed; medication list updated in electronic medical record.  . Eliquis by BMS: Patient meets income/out of pocket spend criteria for this medication's patient assistance program. Reviewed application process. Patient will provide proof of income, out of pocket spend report, and will sign application. Will collaborate with primary care provider  for their portion of application. Once completed, will submit to BMS patient assistance program.  Patient Self Care Activities:  . Patient will provide necessary portions of application   Initial goal documentation     . Pharmacy Care Plan       CARE PLAN ENTRY  Current Barriers:  . Chronic Disease Management support, education, and care coordination needs related to Atrial Fibrillation, HTN, HLD, and DMII  Pharmacist Clinical Goal(s):  [name] Over the next 30 days, patient will work with pharmacist to begin weight loss  Interventions: . Comprehensive medication review performed. . Discussed importance of diet changes to achieve weight loss goals o Patient will return to bariatric diet that allowed him to lose nearly 100 lbs in the past  Patient Self Care Activities:  . Self administers medications as prescribed, Calls pharmacy for medication refills, and Calls provider office for new concerns or questions  Initial goal documentation       Hypertension/Afib    Office blood pressures are  BP Readings from Last 3 Encounters:  04/17/19 134/72  02/07/19 128/86  11/29/18 137/70   Lab Results  Component Value Date   CREATININE 1.30 (H) 10/14/2018   CREATININE 1.41 05/16/2018   CREATININE 1.45 (H) 11/05/2017   Patient has failed these meds in the past: Xarelto - bleeding Patient is currently controlled on the following medications: torsemide 20 mg x 3 daily, flecainide 50 mg q12h, Eliquis 5 mg BID, potassium 40 mEq HS (20  meq x 2)  HR in 50s-60s when wakes up  Patient checks BP at home infrequently Patient home BP readings are ranging: n/a  We discussed diet and exercise extensively; pt reports Eliquis copay is expensive during donut hole phase, typically enters in midsummer.   Plan  Continue current medications and control with diet and exercise  Pursue patient assistance for Eliquis    Diabetes   Recent Relevant Labs: Lab Results  Component Value Date/Time   HGBA1C 6.5 (A) 11/14/2018 03:02 PM   HGBA1C 6.0 05/16/2018 04:16 PM   HGBA1C 6.3 11/13/2017 01:58 PM   MICROALBUR 1.1 12/12/2006 08:55 AM   MICROALBUR 0.4 04/18/2006 11:57 AM    No medication indicated  We discussed: diet and exercise extensively; discussed A1c cutoff for prediabetes vs diabetes - he does now have DM. Discussed importance of lifestyle modifications if he wants to avoid more medications. Pt reports he was able to lose ~100 lbs before and after bariatric surgery while on calorie restrictive diet. Pt is motivated to take  diet changes more seriously now.  Plan  Continue current medications and control with diet and exercise   Hyperlipidemia/ASCVD   Lipid Panel     Component Value Date/Time   CHOL 139 05/16/2018 1616   TRIG 203.0 (H) 05/16/2018 1616   HDL 29.70 (L) 05/16/2018 1616   CHOLHDL 5 05/16/2018 1616   VLDL 40.6 (H) 05/16/2018 1616   LDLCALC 77 06/19/2016 0730   LDLDIRECT 91.0 05/16/2018 1616     The ASCVD Risk score (Goff DC Jr., et al., 2013) failed to calculate for the following reasons:   The patient has a prior MI or stroke diagnosis   Patient has failed these meds in past: n/a Patient is currently controlled on the following medications: atorvastatin 80 mg daily AM  We discussed:  diet and exercise extensively  Plan  Continue current medications and control with diet and exercise    Depression/anxiety   Patient has failed these meds in past: n/a Patient is currently controlled on the  following medications: sertraline 150 mg HS, lorazepam 0.5 mg HS  We discussed:  On sertraline for 15 years, helps a lot. Prescriber has tapered lorazepam over the years.   Plan  Continue current medications   Pain   Patient has failed these meds in past: n/a Patient is currently controlled on the following medications: tramadol 50 mg q8h prn, lidocaine 5% patch  We discussed:  Can't take NSAIDs due to Eliquis, tramadol helps a lot. He does endorse significant back pain making it difficult to exercise or even stand for short periods of time. Encouraged weight loss to help with pain as well.  Plan  Continue current medications   GERD   Patient has failed these meds in past: n/a Patient is currently controlled on the following medications: pantoprazole 40 mg daily  We discussed:  Pt reports he does not have issues with GERD, he did have a GI bleed on Xarelto and so takes PPI for gastroprotection  Plan  Continue current medications    BPH   Patient has failed these meds in past: n/a Patient is currently controlled on the following medications: finasteride 5 mg daily  PSA  Date Value Ref Range Status  12/24/2015 0.68  Final   We discussed:  PSA is controlled, pt denies urinary symptoms other than frequency after torsemide dose.  Plan  Continue current medications    RLS   Patient has failed these meds in past: n/a Patient is currently controlled on the following medications: ropinirole 1.5 mg HS (0.5 mg x 3)  We discussed:  Pt reports vast improvement in sleep and RLS since starting ropinirole.  Plan  Continue current medications   Allergies   Patient has failed these meds in past: n/a Patient is currently controlled on the following medications: montelukast 10 mg daily, loratadine 10 mg daily  We discussed:  Improved nasal drainage since starting montelukast.  Plan  Continue current medications   Health Maintenance   Patient is currently  controlled on the following medications: Vitamin B12, docusate 100 mg QOD, multivitamin w/ iron,  We discussed:  Patient is satisfied with current OTC regimen and denies issues  Plan  Continue current medications    Medication Management   Pt uses [facility] for all medications Uses pill box Pt endorses 100% compliance  We discussed: pt is satisfied with current medication management strategy. Medications are mostly affordable except for Eliquis, which we will pursue PAP. [facility] is the preferred pharmacy with his insurance.  Plan  Continue current medication management strategy      Follow up: 1 month phone visit  Charlene Brooke, PharmD Clinical Pharmacist [facility] at [facility] [phone]

## 2019-07-14 ENCOUNTER — Ambulatory Visit: Payer: Medicare Other | Admitting: Pharmacist

## 2019-07-14 ENCOUNTER — Other Ambulatory Visit: Payer: Self-pay

## 2019-07-14 DIAGNOSIS — E118 Type 2 diabetes mellitus with unspecified complications: Secondary | ICD-10-CM

## 2019-07-14 DIAGNOSIS — I480 Paroxysmal atrial fibrillation: Secondary | ICD-10-CM

## 2019-07-14 DIAGNOSIS — E785 Hyperlipidemia, unspecified: Secondary | ICD-10-CM

## 2019-07-14 DIAGNOSIS — I10 Essential (primary) hypertension: Secondary | ICD-10-CM

## 2019-07-14 DIAGNOSIS — N401 Enlarged prostate with lower urinary tract symptoms: Secondary | ICD-10-CM

## 2019-07-14 NOTE — Addendum Note (Signed)
Addended by: Aviva Signs M on: 07/14/2019 10:20 AM   Modules accepted: Orders

## 2019-07-14 NOTE — Patient Instructions (Signed)
Visit Information  Thank you for meeting with me to discuss your medications! I look forward to working with you to achieve your health care goals. Below is a summary of what we talked about during the visit:  Goals Addressed            This Visit's Progress   . Medication assistance       CARE PLAN ENTRY (see longitudinal plan of care for additional care plan information)  Current Barriers:  . Financial Barriers: patient has [facility] PDP insurance and reports copay for Eliquis is cost prohibitive at this time  Pharmacist Clinical Goal(s):  [name] Over the next 30 days, patient will work with PharmD and providers to relieve medication access concerns  Interventions: . Comprehensive medication review completed; medication list updated in electronic medical record.  . Eliquis by BMS: Patient meets income/out of pocket spend criteria for this medication's patient assistance program. Reviewed application process. Patient will provide proof of income, out of pocket spend report, and will sign application. Will collaborate with primary care provider  for their portion of application. Once completed, will submit to BMS patient assistance program.  Patient Self Care Activities:  . Patient will provide necessary portions of application   Initial goal documentation     . Pharmacy Care Plan       CARE PLAN ENTRY  Current Barriers:  . Chronic Disease Management support, education, and care coordination needs related to Atrial Fibrillation, HTN, HLD, and DMII  Pharmacist Clinical Goal(s):  [name] Over the next 30 days, patient will work with pharmacist to begin weight loss  Interventions: . Comprehensive medication review performed. . Discussed importance of diet changes to achieve weight loss goals o Patient will return to bariatric diet that allowed him to lose nearly 100 lbs in the past  Patient Self Care Activities:  . Self administers medications as prescribed, Calls pharmacy for medication  refills, and Calls provider office for new concerns or questions  Initial goal documentation       [name] was given information about Chronic Care Management services today including:  1. CCM service includes personalized support from designated clinical staff supervised by his physician, including individualized plan of care and coordination with other care providers 2. 24/7 contact phone numbers for assistance for urgent and routine care needs. 3. Standard insurance, coinsurance, copays and deductibles apply for chronic care management only during months in which we provide at least 20 minutes of these services. Most insurances cover these services at 100%, however patients may be responsible for any copay, coinsurance and/or deductible if applicable. This service may help you avoid the need for more expensive face-to-face services. 4. Only one practitioner may furnish and bill the service in a calendar month. 5. The patient may stop CCM services at any time (effective at the end of the month) by phone call to the office staff.  Patient agreed to services and verbal consent obtained.   The patient verbalized understanding of instructions provided today and agreed to receive a mailed copy of patient instruction and/or educational materials. Telephone follow up appointment with pharmacy team member scheduled for: 1 month  Charlene Brooke, PharmD Clinical Pharmacist [facility] at [facility] [phone]   MyPlate from [employer] is an outline of a general healthy diet based on the 2010 Dietary Guidelines for Americans, from the U.S. Department of Agriculture Scientist, research (physical sciences)). It sets guidelines for how To follow MyPlate recommendations:  Eat a wide variety of fruits and vegetables,  grains, and protein foods.  Serve smaller portions and eat less food throughout the day.  Limit portion sizes to avoid overeating.  Enjoy your food.  Get at least 150 minutes of exercise  every week. This is about 30 minutes each day, 5 or more days per week. It can be difficult to have every meal look like MyPlate. Think about MyPlate as eating guidelines for an entire day, rather than each individual meal. Fruits and vegetables  Make half of your plate fruits and vegetables.  Eat many different colors of fruits and vegetables each day.  For a 2,000 calorie daily food plan, eat: ? 2 cups of vegetables every day. ? 2 cups of fruit every day.  1 cup is equal to: ? 1 cup raw or cooked vegetables. ? 1 cup raw fruit. ? 1 medium-sized orange, apple, or banana. ? 1 cup 100% fruit or vegetable juice. ? 2 cups raw leafy greens, such as lettuce, spinach, or kale. ?  cup dried fruit. Grains  One fourth of your plate should be grains.  Make at least half of the grains you eat each day whole grains.  For a 2,000 calorie daily food plan, eat 6 oz of grains every day.  1 oz is equal to: ? 1 slice bread. ? 1 cup cereal. ?  cup cooked rice, cereal, or pasta. Protein  One fourth of your plate should be protein.  Eat a wide variety of protein foods, including meat, poultry, fish, eggs, beans, nuts, and tofu.  For a 2,000 calorie daily food plan, eat 5 oz of protein every day.  1 oz is equal to: ? 1 oz meat, poultry, or fish. ?  cup cooked beans. ? 1 egg. ?  oz nuts or seeds. ? 1 Tbsp peanut butter. Dairy  Drink fat-free or low-fat (1%) milk.  Eat or drink dairy as a side to meals.  For a 2,000 calorie daily food plan, eat or drink 3 cups of dairy every day.  1 cup is equal to: ? 1 cup milk, yogurt, cottage cheese, or soy milk (soy beverage). ? 2 oz processed cheese. ? 1 oz natural cheese. Fats, oils, salt, and sugars  Only small amounts of oils are recommended.  Avoid foods that are high in calories and low in nutritional value (empty calories), like foods high in fat or added sugars.  Choose foods that are low in salt (sodium). Choose foods that  have less than 140 milligrams (mg) of sodium per serving.  Drink water instead of sugary drinks. Drink enough water each day to keep your urine pale yellow. Where to find support  Work with your health care provider or a nutrition specialist (dietitian) to develop a customized eating plan that is right for you.  Download an app (mobile application) to help you track your daily food intake. Where to find more information  Go to [email or web address] for more information. Summary  MyPlate is a general guideline for healthy eating from the USDA. It is based on the 2010 Dietary Guidelines for Americans.  In general, fruits and vegetables should take up  of your plate, grains should take up  of your plate, and protein should take up  of your plate. This information is not intended to replace advice given to you by your health care provider. Make sure you discuss any questions you have with your health care provider. Document Revised: 08/22/2018 Document Reviewed: 06/19/2016 Elsevier Patient Education  [place].

## 2019-07-15 ENCOUNTER — Encounter: Payer: Self-pay | Admitting: Internal Medicine

## 2019-07-15 NOTE — Progress Notes (Signed)
I have collaborated with the care management provider regarding care management and care coordination activities outlined in this encounter and have reviewed this encounter including documentation in the note and care plan. I am certifying that I agree with the content of this note and encounter as supervising physician.  

## 2019-07-17 ENCOUNTER — Ambulatory Visit (INDEPENDENT_AMBULATORY_CARE_PROVIDER_SITE_OTHER): Payer: Medicare Other | Admitting: Internal Medicine

## 2019-07-17 ENCOUNTER — Other Ambulatory Visit: Payer: Self-pay

## 2019-07-17 ENCOUNTER — Encounter: Payer: Self-pay | Admitting: Internal Medicine

## 2019-07-17 DIAGNOSIS — G4733 Obstructive sleep apnea (adult) (pediatric): Secondary | ICD-10-CM | POA: Diagnosis not present

## 2019-07-17 NOTE — Patient Instructions (Signed)
We can continue BIPAP 25/22, mask of choice, humidifier, supplies, AirView/ card  We discussed trying to regularly get morning exposure to daylight to help reset your body clock  We discussed trying melatonin 5 mg every night an hour before bedtime to help you set a regular night time schedule  Please call if we can help

## 2019-07-17 NOTE — Progress Notes (Signed)
HPI male former smoker followed for OSA, Restless Legs,, complicated by A. fib, recurrent PE, bariatric surgery, major depression, dysphonia, midbrain CVA NPSG 10/25/05- severe OSA, AHI 79/ hr, CPAP to 16. Frequent limb jerks with sleep disturbance PFT 01/03/17-minimal obstruction without response to bronchodilator.  FVC 3.94/83%, FEV1 3.00/86%, ratio 2.76, TLC 96%, DLCO 78% Walk test on room air 04/08/2018-  Max HR 91, Min Sat 92% -----------------------------------------------------------------------------------   04/17/19- 74 year old male former smoker followed for OSA, Restless Legs, complicated by A. Fib, diastolic CHF recurrent PE, bariatric surgery, major depression, dysphonia, midbrain CVA, CPAP set 20/Advanced Download compliance 90%, AHI 6.3/ hr Requip 0.5 mg x 3 hs,  Body weight today 385 lbs ED visit for back pain in July. -----Obstructive Sleep Apnea Very limited mobility due to weight and arthralgias. Hoping for Medicare wheel chair.  Due to lack of room he keeps CPAP on foot of bed. Stopped using humidifier because it kept spilling. Mouth dry. Suggesting Biotene.  07/17/19- 75 year old male former smoker followed for OSA, Restless Legs, complicated by A. Fib, diastolic CHF recurrent PE, bariatric surgery, major depression, dysphonia, midbrain CVA, Recurrent PE,  BiPap 25/ 22/Adapt Download compliance 85%, AHI 3.3/ hr Requip 0.5 mg x 3 hs,  Body weight today 385 lbs Gets days and nights mixed up. Discussed sleep hygiene. Doesn't want "more pills". "Still trying to get used to BIPAP"., but meeting goals.   ROS-see HPI   + = positive Constitutional:    weight loss, night sweats, fevers, chills, fatigue, lassitude. HEENT:    headaches, difficulty swallowing, tooth/dental problems, sore throat,       sneezing, itching, ear ache,+ nasal congestion, post nasal drip, snoring CV:    chest pain, orthopnea, PND, swelling in lower extremities, anasarca,                                                 dizziness, +palpitations Resp:   +shortness of breath with exertion or at rest.                productive cough,   non-productive cough, coughing up of blood.              change in color of mucus.  wheezing.   Skin:    rash or lesions. GI:  No-   heartburn, indigestion, abdominal pain, nausea, vomiting,  GU:  MS:   joint pain, stiffness, Neuro-     nothing unusual Psych:  change in mood or affect.  depression or anxiety.   memory loss.  OBJ- Physical Exam General- Alert, Oriented, Affect-appropriate, Distress- none acute, + morbidly obese, +wheelchair Skin- rash-none, lesions- none, excoriation- none Lymphadenopathy- none Head- atraumatic            Eyes- Gross vision intact, PERRLA, conjunctivae and secretions clear            Ears- +Hearing aid            Nose-  +moderate turbinate edema and mucus congestion, no-Septal dev, mucus, polyps,  erosion, perforation             Throat- Mallampati III , mucosa clear , drainage- none, tonsils- atrophic, + hoarse Neck- flexible , trachea midline, no stridor , thyroid nl, carotid no bruit Chest - symmetrical excursion , unlabored           Heart/CV- RRR , no murmur ,  no gallop  , no rub, nl s1 s2                           - JVD- none , edema- none, stasis changes- none, varices- none           Lung- clear to P&A, wheeze- none, cough- none , dullness-none, rub- none           Chest wall-  Abd-  Br/ Gen/ Rectal- Not done, not indicated Extrem- cyanosis- none, clubbing, none, atrophy- none, strength- nl Neuro- grossly intact to observation

## 2019-07-21 ENCOUNTER — Ambulatory Visit: Payer: Medicare Other | Admitting: Podiatry

## 2019-07-24 ENCOUNTER — Telehealth: Payer: Self-pay | Admitting: *Deleted

## 2019-07-24 ENCOUNTER — Ambulatory Visit: Payer: Medicare Other | Admitting: Physical Medicine & Rehabilitation

## 2019-07-24 NOTE — Telephone Encounter (Signed)
[name] called and says that his back in the area you were working on has improved but now a different spot is causing him a great deal of pain.  He has an appt with you on Tuesday 4/27 and is asking if you could give him something stronger than tramadol to get through to that appointment.  Please advise.

## 2019-07-25 ENCOUNTER — Other Ambulatory Visit: Payer: Self-pay | Admitting: Physical Medicine & Rehabilitation

## 2019-07-25 ENCOUNTER — Other Ambulatory Visit: Payer: Self-pay | Admitting: Internal Medicine

## 2019-07-25 DIAGNOSIS — K2100 Gastro-esophageal reflux disease with esophagitis, without bleeding: Secondary | ICD-10-CM

## 2019-07-25 MED ORDER — HYDROCODONE-ACETAMINOPHEN 5-325 MG PO TABS: 1.0000 | ORAL_TABLET | Freq: Four times a day (QID) | ORAL | 0 refills | Status: DC | PRN

## 2019-07-25 NOTE — Telephone Encounter (Signed)
Mr [name] notified that medication sent to pharmacy.

## 2019-07-25 NOTE — Telephone Encounter (Signed)
He says he has tried all of those measures. He has had hydrocodone with Dr Gwynn Burly and did ok. He is just asking for enough to get him through to the Tuesday appointment.

## 2019-07-25 NOTE — Telephone Encounter (Signed)
Based on pt's allergies I would be concerned about giving him anything "stronger" than tramadol.  May take tylenol 650mg  along with the tramadol.  Also may use heating pad as well as Salon Pas patch twice a day

## 2019-07-28 ENCOUNTER — Ambulatory Visit (INDEPENDENT_AMBULATORY_CARE_PROVIDER_SITE_OTHER): Payer: Medicare Other

## 2019-07-28 ENCOUNTER — Ambulatory Visit (INDEPENDENT_AMBULATORY_CARE_PROVIDER_SITE_OTHER): Payer: Medicare Other | Admitting: Podiatry

## 2019-07-28 ENCOUNTER — Other Ambulatory Visit: Payer: Self-pay

## 2019-07-28 DIAGNOSIS — M659 Synovitis and tenosynovitis, unspecified: Secondary | ICD-10-CM

## 2019-07-28 DIAGNOSIS — M25572 Pain in left ankle and joints of left foot: Secondary | ICD-10-CM

## 2019-07-28 MED ORDER — METHYLPREDNISOLONE 4 MG PO TBPK
ORAL_TABLET | ORAL | 0 refills | Status: DC
Start: 2019-07-28 — End: 2019-10-28

## 2019-07-29 ENCOUNTER — Other Ambulatory Visit: Payer: Self-pay

## 2019-07-29 ENCOUNTER — Encounter: Payer: Self-pay | Admitting: Physical Medicine & Rehabilitation

## 2019-07-29 ENCOUNTER — Encounter: Payer: Medicare Other | Attending: Physical Medicine & Rehabilitation | Admitting: Physical Medicine & Rehabilitation

## 2019-07-29 VITALS — BP 147/67 | HR 76 | Temp 98.3°F | Ht 72.0 in | Wt 393.8 lb

## 2019-07-29 DIAGNOSIS — G894 Chronic pain syndrome: Secondary | ICD-10-CM | POA: Insufficient documentation

## 2019-07-29 DIAGNOSIS — Z79891 Long term (current) use of opiate analgesic: Secondary | ICD-10-CM | POA: Diagnosis not present

## 2019-07-29 DIAGNOSIS — Z5181 Encounter for therapeutic drug level monitoring: Secondary | ICD-10-CM | POA: Diagnosis not present

## 2019-07-29 NOTE — Progress Notes (Signed)
Subjective:    Patient ID: [name], male    DOB: June 01, 1945, 74 y.o.   MRN: [id number] 74 year old right-handed male with history of atrial fibrillation, pulmonary emboli, maintained on Eliquis, as well as history of hypertension, obstructive sleep apnea, diastolic congestive heart failure.  He lives with spouse independent with a cane and a walker prior to admission.  One-level home.  Presented June 19, 2016, with headache, sudden onset while watching television leaning to the left side as well as slurred speech.  Cranial CT scan negative.  He did not receive tPA.  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  No proximal occlusion.  No significant stenosis or dissection of carotid arteries.  MRI showed a small left medullary infarct as well as LV4 occlusion.  Interventional Radiology consulted, no need for emergent endovascular treatment.  Echocardiogram with ejection fraction [id number], grade 1 diastolic dysfunction.   CIR admission for Wallenberg syndrome DATE OF ADMISSION:  06/21/2016 DATE OF DISCHARGE:  07/12/2016   July 2020 right lumbar L3, L4 medial branch blocks and L5 dorsal ramus injection under fluoroscopic guidance  June 2020Left sacroiliac injection under fluoroscopic guidanceLeft sacroiliac injection under fluoroscopic guidance  . HPI  74 year old male with history of morbid obesity and chronic low back pain.  Patient has left greater than right-sided pain at the current time.  His pain is right around the belt line.  The patient feels he had injured that area when he participated in gymnastics greater than 50 years ago. Patient feels the pain is interfering with mobility.  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 He has no pain radiating into the left foot, no numbness or tingling in the left  foot No pain shooting down the left leg No new bowel or bladder dysfunction Pain Inventory Average Pain 6 Pain Right Now 6 My pain is sharp, stabbing and tingling  In the last 24 hours, has pain interfered with the following? General activity 5 Relation with others 5 Enjoyment of life 5 What TIME of day is your pain at its worst? daytime Sleep (in general) Good  Pain is worse with: walking, bending and standing Pain improves with: rest and medication Relief from Meds: 5  Mobility use a walker ability to climb steps?  no do you drive?  yes  Function retired  Neuro/Psych weakness numbness trouble walking dizziness  Prior Studies Any changes since last visit?  no  Physicians involved in your care Any changes since last visit?  no   Family History  Problem Relation Age of Onset  . Depression Mother   . Hypertension Mother   . Dementia Mother   . Heart disease Father        MI  . Depression Brother   . Stroke Paternal Aunt        CVA  . Diabetes Paternal Uncle   . Heart disease Paternal Uncle        MI  . Heart disease Paternal Grandmother        MI  . Diabetes Cousin        PATERNAL   Social History   Socioeconomic History  . Marital status: Married    Spouse name: Not on file  . Number of children: 1  . Years of education: Not on file  . Highest education level: Not  on file  Occupational History  . Occupation: retired-Manager [employer]  Tobacco Use  . Smoking status: Former Smoker    Packs/day: 0.20    Years: 13.00    Pack years: 2.60    Types: Cigarettes    Start date: 04/03/1961    Quit date: 08/30/1974    Years since quitting: 44.9  . Smokeless tobacco: Never Used  Substance and Sexual Activity  . Alcohol use: No    Alcohol/week: 0.0 standard drinks  . Drug use: No  . Sexual activity: Not Currently  Other Topics Concern  . Not on file  Social History Narrative   Lives at home w/ his wife   Right-handed   Caffeine: decaf coffee,  occasional tea   Social Determinants of Health   Financial Resource Strain:   . Difficulty of Paying Living Expenses:   Food Insecurity:   . Worried About Charity fundraiser in the Last Year:   . Arboriculturist in the Last Year:   Transportation Needs:   . Film/video editor (Medical):   [name] Lack of Transportation (Non-Medical):   Physical Activity:   . Days of Exercise per Week:   . Minutes of Exercise per Session:   Stress:   . Feeling of Stress :   Social Connections:   . Frequency of Communication with Friends and Family:   . Frequency of Social Gatherings with Friends and Family:   . Attends Religious Services:   . Active Member of Clubs or Organizations:   . Attends Archivist Meetings:   [name] Marital Status:    Past Surgical History:  Procedure Laterality Date  . ARTHROSCOPY KNEE W/ DRILLING    . BREATH TEK H PYLORI N/A 12/23/2012   Procedure: BREATH TEK H PYLORI;  Surgeon: Gayland Curry, MD;  Location: [facility];  Service: General;  Laterality: N/A;  . CHOLECYSTECTOMY  1989  . COLONOSCOPY N/A 07/31/2013   Procedure: COLONOSCOPY;  Surgeon: Gatha Mayer, MD;  Location: [facility];  Service: Endoscopy;  Laterality: N/A;  . ESOPHAGOGASTRODUODENOSCOPY N/A 07/22/2013   Procedure: ESOPHAGOGASTRODUODENOSCOPY (EGD);  Surgeon: Irene Shipper, MD;  Location: [facility] ENDOSCOPY;  Service: Endoscopy;  Laterality: N/A;  . ESOPHAGOGASTRODUODENOSCOPY N/A 07/31/2013   Procedure: ESOPHAGOGASTRODUODENOSCOPY (EGD);  Surgeon: Gatha Mayer, MD;  Location: [facility] ENDOSCOPY;  Service: Endoscopy;  Laterality: N/A;  . INTRAOPERATIVE TRANSESOPHAGEAL ECHOCARDIOGRAM N/A 07/18/2013   Procedure: INTRAOPERATIVE TRANSESOPHAGEAL ECHOCARDIOGRAM;  Surgeon: Grace Isaac, MD;  Location: [facility];  Service: Open Heart Surgery;  Laterality: N/A;  . LAPAROSCOPIC GASTRIC BANDING WITH HIATAL HERNIA REPAIR N/A 04/08/2013   Procedure: LAPAROSCOPIC GASTRIC BANDING WITH possible  HIATAL HERNIA REPAIR;  Surgeon:  Gayland Curry, MD;  Location: [facility];  Service: General;  Laterality: N/A;  . LITHOTRIPSY    . renal calculi    . SUBXYPHOID PERICARDIAL WINDOW N/A 07/18/2013   Procedure: SUBXYPHOID PERICARDIAL WINDOW;  Surgeon: Grace Isaac, MD;  Location: [facility];  Service: Thoracic;  Laterality: N/A;  . TONSILLECTOMY    . total knee replacment     bilat  . trigger finger repair     also lue, cts surgically repaired  . WISDOM TOOTH EXTRACTION     Past Medical History:  Diagnosis Date  . Anemia 07/29/2013  . Asthma    childhood asthma  . Benign essential HTN   . Benign prostatic hypertrophy    several prostate biopsies neg for cancer.   . Bilateral hip pain   .  Cancer ([place])   . CHF (congestive heart failure) ([place])   . Chronic anticoagulation 07/22/2013  . Complication of anesthesia    MORPHINE CAUSES HALLUCINATIONS  . CVA (cerebral vascular accident) ([place]) 06/2016  . Depression   . Diastolic congestive heart failure ([place]) 07/16/2013  . Dysphagia, post-stroke   . Embolic cerebral infarction ([place]) 06/21/2016  . FH: colonic polyps 2010  . Gout   . History of kidney stones   . Hypertension   . Kidney cysts   . Morbid obesity ([place])   . Numbness    feet / legs  . PAF (paroxysmal atrial fibrillation) with RVR 07/29/2013  . Pericarditis 2015   s/p window  . Recurrent pulmonary emboli ([place]) 06/13/2015  . Sleep apnea    USES C PAP  . Stroke syndrome   . 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   BP (!) 147/67   Pulse 76   Temp 98.3 F (36.8 C)   Ht 6' (1.829 m)   Wt (!) 393 lb 12.8 oz (178.6 kg)   SpO2 96%   BMI 53.41 kg/m   Opioid Risk Score:   Fall Risk Score:  `1  Depression screen PHQ 2/9  Depression screen [facility] 2/9 11/14/2018 05/16/2018 10/01/2017 07/28/2016  Decreased Interest 1 1 0 0  Down, Depressed, Hopeless 1 1 0 0  PHQ - 2 Score 2 2 0 0  Altered sleeping 1 1 - 0  Tired, decreased energy 2 2 - 1  Change in appetite 1 0 - 0  Feeling bad or failure about yourself  1 1 - 0  Trouble  concentrating 1 0 - 1  Moving slowly or fidgety/restless 0 0 - 0  Suicidal thoughts 0 0 - -  PHQ-9 Score 8 6 - 2  Difficult doing work/chores Somewhat difficult Not difficult at all - Not difficult at all  Some recent data might be hidden     Review of Systems  Respiratory: Positive for apnea, shortness of breath and wheezing.   Musculoskeletal: Positive for gait problem.  Neurological: Positive for dizziness, weakness and numbness.  All other systems reviewed and are negative.      Objective:   Physical Exam Vitals and nursing note reviewed.  Constitutional:      Appearance: He is obese.  Eyes:     General: No scleral icterus.       Right eye: No discharge.        Left eye: No discharge.     Extraocular Movements: Extraocular movements intact.     Conjunctiva/sclera: Conjunctivae normal.     Pupils: Pupils are equal, round, and reactive to light.  Musculoskeletal:     Comments: The patient has 50% lumbar flexion 0 to 25% lumbar extension.  He has pain mainly with lumbar extension. He has tenderness to palpation around the L5 paraspinal on the left side only this also extends into the S1 area. There is no tenderness in the upper lumbar area on the left for in the right lumbar paraspinal area.  Skin:    General: Skin is warm and dry.     Coloration: Skin is not jaundiced.  Neurological:     Mental Status: He is alert and oriented to person, place, and time.     Gait: Gait abnormal.     Comments: Motor strength is 5/5 bilateral hip flexor knee extensor ankle dorsiflexors. Negative straight leg raising bilaterally Ambulates with a walker forward flexed posture. Standing balance is fair he does use a wide-based stance.  Appears  unsteady when he is not holding onto something  Psychiatric:        Mood and Affect: Mood normal.        Behavior: Behavior normal.        Thought Content: Thought content normal.     Pain worst with bending backwards      Assessment & Plan:   1.  Left-sided lumbar pain appears to be mainly at the L5-S1 area.  We discussed that there are 2 potential pain generators in this area that may be amenable to spinal injections. He has had good relief with left sacroiliac joint injection under fluoroscopic guidance which was performed around 10 months ago.  He feels like the location of his pain however is not in the same area. We also discussed that he had very good results with right-sided lumbar pain relief after right-sided L3-L4 medial branch right L5 dorsal ramus injection which was performed in July 2020.  Reviewing his MRI from last year as well as his directional complaints, the left L4-5 L5-S1 joint complexes appears to be the most likely pain generators.  We will schedule for left L3-L4 medial branch left L5 dorsal ramus injection under fluoroscopic guidance. As discussed with the patient he will not need to stop the Eliquis prior to injection. In the meantime his podiatrist prescribed him Medrol Dosepak for ankle pain.  We discussed that while this may increase his blood sugars, he is borderline diabetic, it should not interfere with his injection.  We also discussed that he should try coming off the hydrocodone when he is on the Medrol Dosepak. He feels the hydrocodone has been very helpful as a "rescue medication".  He has not been taking his tramadol when he takes his hydrocodone.  The maximum amount of hydrocodone he took in 1 day was 2 tablets.

## 2019-07-29 NOTE — Patient Instructions (Signed)
Do not need to stop Eliquis for injection   Try not taking hydrocodone when you start the medrol dosepack (cortisone)

## 2019-07-31 NOTE — Progress Notes (Signed)
   Subjective:  74 y.o. male presenting today for follow up evaluation of left ankle pain secondary to an ankle sprain 6-8 months ago. He was seen by Dr. Paulla Dolly and received an injection which helped alleviate his pain for about three weeks. He has been taking Vicodin prescribed by pain management. Walking and being on the ankle for long periods of time increases the pain. Patient is here for further evaluation and treatment.    Past Medical History:  Diagnosis Date  . Anemia 07/29/2013  . Asthma    childhood asthma  . Benign essential HTN   . Benign prostatic hypertrophy    several prostate biopsies neg for cancer.   . Bilateral hip pain   . Cancer ([place])   . CHF (congestive heart failure) ([place])   . Chronic anticoagulation 07/22/2013  . Complication of anesthesia    MORPHINE CAUSES HALLUCINATIONS  . CVA (cerebral vascular accident) ([place]) 06/2016  . Depression   . Diastolic congestive heart failure ([place]) 07/16/2013  . Dysphagia, post-stroke   . Embolic cerebral infarction ([place]) 06/21/2016  . FH: colonic polyps 2010  . Gout   . History of kidney stones   . Hypertension   . Kidney cysts   . Morbid obesity ([place])   . Numbness    feet / legs  . PAF (paroxysmal atrial fibrillation) with RVR 07/29/2013  . Pericarditis 2015   s/p window  . Recurrent pulmonary emboli ([place]) 06/13/2015  . Sleep apnea    USES C PAP  . Stroke syndrome   . 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 Dermatology:  Skin is warm, dry and supple bilateral lower extremities. Negative for open lesions or macerations. Vascular:  Palpable pedal pulses bilaterally. No edema or erythema noted. Capillary refill within normal limits. Neurological:  Epicritic and protective threshold grossly intact bilaterally.  Musculoskeletal Exam:  Pain on palpation to the anterior lateral medial aspects of the patient's left ankle. Mild  edema noted. Range of motion within normal limits to all pedal and ankle joints bilateral. Muscle strength 5/5 in all groups bilateral.   Radiographic Exam: Degenerative changes with joint space narrowing noted to the left ankle joint.   Assessment: 1. Left ankle synovitis / DJD  Plan of Care:  1. Patient was evaluated. X-Rays reviewed.  2. Injection of 0.5 mL Celestone Soluspan injected in the patient's left ankle. 3. Prescription for Medrol Dose Pak provided to patient. 4. Patient on Eliquis; cannot take NSAIDs.  5. Continue taking Tramadol and Vicodin as directed by prescribing physician.  6. Return to clinic as needed.    Edrick Kins, DPM [facility]  Dr. Edrick Kins, [place]                                        [place], [place] [address]                Office [phone]  Fax [phone]

## 2019-08-01 LAB — DRUG TOX MONITOR 1 W/CONF, ORAL FLD
Alprazolam: NEGATIVE ng/mL (ref <0.50)
Amphetamines: NEGATIVE ng/mL (ref <10)
Barbiturates: NEGATIVE ng/mL (ref <10)
Benzodiazepines: NEGATIVE ng/mL (ref <0.50)
Buprenorphine: NEGATIVE ng/mL (ref <0.10)
Chlordiazepoxide: NEGATIVE ng/mL (ref <0.50)
Clonazepam: NEGATIVE ng/mL (ref <0.50)
Cocaine: NEGATIVE ng/mL (ref <5.0)
Codeine: NEGATIVE ng/mL (ref <2.5)
Diazepam: NEGATIVE ng/mL (ref <0.50)
Dihydrocodeine: 8.9 ng/mL — ABNORMAL HIGH (ref <2.5)
Fentanyl: NEGATIVE ng/mL (ref <0.10)
Flunitrazepam: NEGATIVE ng/mL (ref <0.50)
Flurazepam: NEGATIVE ng/mL (ref <0.50)
Heroin Metabolite: NEGATIVE ng/mL (ref <1.0)
Hydrocodone: 60.1 ng/mL — ABNORMAL HIGH (ref <2.5)
Hydromorphone: NEGATIVE ng/mL (ref <2.5)
Lorazepam: NEGATIVE ng/mL (ref <0.50)
MARIJUANA: NEGATIVE ng/mL (ref <2.5)
MDMA: NEGATIVE ng/mL (ref <10)
Meprobamate: NEGATIVE ng/mL (ref <2.5)
Methadone: NEGATIVE ng/mL (ref <5.0)
Midazolam: NEGATIVE ng/mL (ref <0.50)
Morphine: NEGATIVE ng/mL (ref <2.5)
Nicotine Metabolite: NEGATIVE ng/mL (ref <5.0)
Nordiazepam: NEGATIVE ng/mL (ref <0.50)
Norhydrocodone: NEGATIVE ng/mL (ref <2.5)
Noroxycodone: NEGATIVE ng/mL (ref <2.5)
Opiates: POSITIVE ng/mL — AB (ref <2.5)
Oxazepam: NEGATIVE ng/mL (ref <0.50)
Oxycodone: NEGATIVE ng/mL (ref <2.5)
Oxymorphone: NEGATIVE ng/mL (ref <2.5)
Phencyclidine: NEGATIVE ng/mL (ref <10)
Tapentadol: NEGATIVE ng/mL (ref <5.0)
Temazepam: NEGATIVE ng/mL (ref <0.50)
Tramadol: 223.5 ng/mL — ABNORMAL HIGH (ref <5.0)
Tramadol: POSITIVE ng/mL — AB (ref <5.0)
Triazolam: NEGATIVE ng/mL (ref <0.50)
Zolpidem: NEGATIVE ng/mL (ref <5.0)

## 2019-08-01 LAB — DRUG TOX ALC METAB W/CON, ORAL FLD: Alcohol Metabolite: NEGATIVE ng/mL (ref <25)

## 2019-08-04 ENCOUNTER — Telehealth: Payer: Self-pay | Admitting: *Deleted

## 2019-08-04 NOTE — Telephone Encounter (Signed)
Oral swab drug screen was consistent for prescribed medications.  ?

## 2019-08-08 ENCOUNTER — Other Ambulatory Visit: Payer: Self-pay | Admitting: Internal Medicine

## 2019-08-15 ENCOUNTER — Other Ambulatory Visit: Payer: Self-pay | Admitting: Internal Medicine

## 2019-08-15 DIAGNOSIS — H1010 Acute atopic conjunctivitis, unspecified eye: Secondary | ICD-10-CM

## 2019-08-15 DIAGNOSIS — E785 Hyperlipidemia, unspecified: Secondary | ICD-10-CM

## 2019-08-15 DIAGNOSIS — J309 Allergic rhinitis, unspecified: Secondary | ICD-10-CM

## 2019-08-17 NOTE — Assessment & Plan Note (Signed)
Benefits from BIPAP and meeting goals. Plan- continue BIPAP 25/22

## 2019-08-17 NOTE — Assessment & Plan Note (Addendum)
Medical history is probably too complicated for himto be a candidate for repeat bariatric surgery.  Plan- encourage diet effort

## 2019-08-20 ENCOUNTER — Ambulatory Visit: Payer: Medicare Other | Admitting: Pharmacist

## 2019-08-20 ENCOUNTER — Other Ambulatory Visit: Payer: Self-pay

## 2019-08-20 DIAGNOSIS — I480 Paroxysmal atrial fibrillation: Secondary | ICD-10-CM

## 2019-08-20 DIAGNOSIS — I10 Essential (primary) hypertension: Secondary | ICD-10-CM

## 2019-08-20 DIAGNOSIS — E785 Hyperlipidemia, unspecified: Secondary | ICD-10-CM

## 2019-08-20 DIAGNOSIS — I6310 Cerebral infarction due to embolism of unspecified precerebral artery: Secondary | ICD-10-CM

## 2019-08-20 DIAGNOSIS — E118 Type 2 diabetes mellitus with unspecified complications: Secondary | ICD-10-CM

## 2019-08-20 NOTE — Chronic Care Management (AMB) (Signed)
Chronic Care Management Pharmacy  Name: [name]  MRN: [id number] DOB: 1946/02/15   Chief Complaint/ HPI  [name],  74 y.o. , male presents for their Initial CCM visit with the clinical pharmacist via telephone due to COVID-19 Pandemic.  PCP : Janith Lima, MD  Their chronic conditions include: Afib, Recurrent PE, embolic CVA, HTN, HLD, [id number], GERD  Stroke 2018, balance is off. Walks with cane/walker - LLE pain and problems. Lower back pain (Dr Ella Bodo - lumbar injections)  Bariatric band surgery Jan 2015. Lost 100 lbs in 2014-2015, had many health issues later in 2015.   Office Visits: 02/07/19 Dr Jenny Reichmann: acute calf pain x 3 days, r/o DVT, likely MSK. Rx'd Norco prn.  11/14/18 Dr Ronnald Ramp OV: acute foot pain, likely plantar fasciitis, referred to podiatry. A1c up to 6.5, new onset DM, no meds indicated yet.  Consult Visit: 06/03/19 Dr Debara Pickett (cardiology): persistent DOE, 2/2 morbid obesity and diastolic dysfunction. Afib controlled.   05/29/19 [facility] ENT: MBS normal, voice and swallowing stable.  05/12/19 Dr Adele Schilder (psych): pt stable, no med changes.  05/04/19 sleep study: OSA uncontrolled w/ current CPAP. Changed to BiPAP.   04/17/19 Dr Annamaria Boots (pulmonary): CPAP not doing enough, scheduled BiPAP sleep study.  10/2018: ED visit for back pain.   Allergies  Allergen Reactions  . Morphine Nausea And Vomiting and Other (See Comments)    Severe hallucinations  . Codeine Nausea Only  . Penicillins Other (See Comments)    Passed out after injection Did it involve swelling of the face/tongue/throat, SOB, or low BP? Unknown Did it involve sudden or severe rash/hives, skin peeling, or any reaction on the inside of your mouth or nose? No Did you need to seek medical attention at a hospital or doctor's office? Yes When did it last happen?teenager If all above answers are "NO", may proceed with cephalosporin use.  [name] Propoxyphene N-Acetaminophen Nausea Only     Medications: Outpatient Encounter Medications as of 08/20/2019  Medication Sig Note  . apixaban (ELIQUIS) 5 MG TABS tablet Take 1 tablet (5 mg total) by mouth 2 (two) times daily.   [name] atorvastatin (LIPITOR) 80 MG tablet Take 1 tablet by mouth once daily   . Cyanocobalamin (VITAMIN B-12 PO) Take 1 tablet by mouth at bedtime.   [name] Sodium (DSS) 100 MG CAPS Take 100 mg by mouth every other day.   . finasteride (PROSCAR) 5 MG tablet Take 1 tablet (5 mg total) by mouth daily.   . flecainide (TAMBOCOR) 50 MG tablet Take 1 tablet (50 mg total) by mouth every 12 (twelve) hours. OV NEEED   . HYDROcodone-acetaminophen (NORCO/VICODIN) 5-325 MG tablet Take 1 tablet by mouth every 6 (six) hours as needed. 07/29/2019: #20 on 07/25/2019 #12 today LD 07/29/2019  . lidocaine (LIDODERM) 5 % Place 1 patch onto the skin daily. Remove & Discard patch within 12 hours or as directed by MD   . loratadine (CLARITIN) 10 MG tablet Take 10 mg by mouth daily.   [name] LORazepam (ATIVAN) 0.5 MG tablet Take 1 tablet (0.5 mg total) by mouth at bedtime.   . methylPREDNISolone (MEDROL DOSEPAK) 4 MG TBPK tablet 6 day dose pack - take as directed   . montelukast (SINGULAIR) 10 MG tablet TAKE 1 TABLET BY MOUTH AT BEDTIME   . OVER THE COUNTER MEDICATION Take 1 tablet by mouth daily. Multi vitamin with iron   . pantoprazole (PROTONIX) 40 MG tablet Take 1 tablet by mouth once daily   .  potassium chloride SA (KLOR-CON) 20 MEQ tablet TAKE 1  BY MOUTH TWICE DAILY **AFTERNOON  AND  BEDTIME**   . rOPINIRole (REQUIP) 0.5 MG tablet Take 1 tablet (0.5 mg total) by mouth 3 (three) times daily. (Patient taking differently: Take 1.5 mg by mouth at bedtime. )   . sertraline (ZOLOFT) 100 MG tablet Take 1.5 tablets (150 mg total) by mouth at bedtime.   . torsemide (DEMADEX) 20 MG tablet TAKE 3 TABLETS BY MOUTH ONCE DAILY. KEEP APPOINTMENT AS SCHEDULED   . traMADol (ULTRAM) 50 MG tablet TAKE 1 TABLET BY MOUTH EVERY 8 HOURS AS NEEDED 07/29/2019:  #90 on 07/21/2019 #88 today LD 07/25/2019  . [DISCONTINUED] atorvastatin (LIPITOR) 80 MG tablet Take 1 tablet (80 mg total) by mouth daily.   . [DISCONTINUED] montelukast (SINGULAIR) 10 MG tablet Take 1 tablet (10 mg total) by mouth at bedtime.    No facility-administered encounter medications on file as of 08/20/2019.     Current Diagnosis/Assessment:  SDOH Interventions     Most Recent Value  SDOH Interventions  SDOH Interventions for the Following Domains  Financial Strain  Financial Strain Interventions  Other (Comment) [Eliquis PAP for donut hole coverage]      Goals Addressed            This Visit's Progress   . Pharmacy Care Plan       CARE PLAN ENTRY  Current Barriers:  . Chronic Disease Management support, education, and care coordination needs related to Hypertension, Hyperlipidemia, Diabetes, and Atrial Fibrillation   Hypertension/Atrial fibrillation . Pharmacist Clinical Goal(s): o Over the next 90 days, patient will work with PharmD and providers to maintain BP goal <130/80 . Current regimen:  o torsemide 20 mg x 3 daily,  o flecainide 50 mg every 12 hours  o Eliquis 5 mg twice a day o potassium 40 mEq at bedtime (20 meq x 2) . Interventions: o Discussed BP goal and benefits of maintaining BP in goal range, including stroke prevention o Pursue patient assistance for Eliquis . Patient self care activities - Over the next 90 days, patient will: o Check BP once weekly, document, and provide at future appointments o Ensure daily salt intake < 2300 mg/day o Complete patient section of Eliquis application and mail to manufacturer  Hyperlipidemia/history of stroke . Pharmacist Clinical Goal(s): o Over the next 90 days, patient will work with PharmD and providers to achieve LDL goal < 70 . Current regimen:  o Atorvastatin 80 mg daily . Interventions: o Discussed benefits of statin for cholesterol lowering and prevention of stroke . Patient self care activities -  Over the next 90 days, patient will: o Continue medication as prescribed o Continue low cholesterol diet  Diabetes Hemoglobin A1C  Date Value Ref Range Status  11/14/2018 6.5 (A) 4.0 - 5.6 % Final .  Pharmacist Clinical Goal(s): o Over the next 90 days, patient will work with PharmD and providers to maintain A1c goal <6.5% . Current regimen:  o No medications indicated . Interventions: o Discussed benefits of weight loss for prevention of diabetes complications . Patient self care activities - Over the next 90 days, patient will: o Reduce carbohydrates in diet o Increase exercise for weight loss  Medication management . Pharmacist Clinical Goal(s): o Over the next 90 days, patient will work with PharmD and providers to maintain optimal medication adherence . Current pharmacy: [facility] . Interventions o Comprehensive medication review performed. o Continue current medication management strategy . Patient self care activities -  Over the next 90 days, patient will: o Focus on medication adherence by pill box o Take medications as prescribed o Report any questions or concerns to PharmD and/or provider(s)  Initial goal documentation        Hypertension/Afib   Office blood pressures are  BP Readings from Last 3 Encounters:  07/29/19 (!) 147/67  07/17/19 122/80  04/17/19 134/72   Kidney Function Lab Results  Component Value Date/Time   CREATININE 1.30 (H) 10/14/2018 02:09 AM   CREATININE 1.41 05/16/2018 04:16 PM   CREATININE 1.12 12/05/2012 02:06 PM   GFR 49.35 (L) 05/16/2018 04:16 PM   GFRNONAA 55 (L) 10/14/2018 02:09 AM   GFRAA >60 10/14/2018 02:09 AM   Patient has failed these meds in the past: Xarelto - bleeding Patient is currently controlled on the following medications:   torsemide 20 mg x 3 daily,   flecainide 50 mg q12h,   Eliquis 5 mg BID,   potassium 40 mEq HS (20 meq x 2)  HR in 50s-60s when wakes up  Patient checks BP at home  infrequently Patient home BP readings are ranging: n/a  We discussed diet and exercise extensively; pt reports Eliquis copay is expensive during donut hole phase, typically enters in midsummer. He has received PAP paperwork and plans to complete prior to current 90 day supply running out.  Plan  Continue current medications and control with diet and exercise  Pursue patient assistance for Eliquis    Diabetes   Recent Relevant Labs: Lab Results  Component Value Date/Time   HGBA1C 6.5 (A) 11/14/2018 03:02 PM   HGBA1C 6.0 05/16/2018 04:16 PM   HGBA1C 6.3 11/13/2017 01:58 PM   MICROALBUR 1.1 12/12/2006 08:55 AM   MICROALBUR 0.4 04/18/2006 11:57 AM    No medication indicated  We discussed: diet and exercise extensively; discussed A1c cutoff for prediabetes vs diabetes - he does now have DM. Discussed importance of lifestyle modifications if he wants to avoid more medications. Pt reports he was able to lose ~100 lbs before and after bariatric surgery while on calorie restrictive diet. Pt is motivated to take diet changes more seriously now.   Plan  Continue current medications and control with diet and exercise   Hyperlipidemia/ASCVD   Lipid Panel     Component Value Date/Time   CHOL 139 05/16/2018 1616   TRIG 203.0 (H) 05/16/2018 1616   HDL 29.70 (L) 05/16/2018 1616   CHOLHDL 5 05/16/2018 1616   VLDL 40.6 (H) 05/16/2018 1616   LDLDIRECT 91.0 05/16/2018 1616    The ASCVD Risk score (Goff DC Jr., et al., 2013) failed to calculate for the following reasons:   The patient has a prior MI or stroke diagnosis (embolic CVA)   Patient has failed these meds in past: n/a Patient is currently controlled on the following medications:   atorvastatin 80 mg daily AM  We discussed:  diet and exercise extensively; no antiplatelet due to Eliquis  Plan  Continue current medications and control with diet and exercise    Depression/anxiety   Patient has failed these meds in past:  n/a Patient is currently controlled on the following medications:   sertraline 150 mg HS,   lorazepam 0.5 mg HS  We discussed:  On sertraline for 15 years, helps a lot. Prescriber has tapered lorazepam over the years.   Plan  Continue current medications   Pain   Patient has failed these meds in past: n/a Patient is currently controlled on the following medications:  tramadol 50 mg q8h prn,   lidocaine 5% patch  We discussed:  Can't take NSAIDs due to Eliquis, tramadol helps a lot. He does endorse significant back pain making it difficult to exercise or even stand for short periods of time. Encouraged weight loss to help with pain as well.  Plan  Continue current medications   GERD   Patient has failed these meds in past: n/a Patient is currently controlled on the following medications:   pantoprazole 40 mg daily  We discussed:  Pt reports he does not have issues with GERD, he did have a GI bleed on Xarelto and so takes PPI for gastroprotection  Plan  Continue current medications    BPH   Patient has failed these meds in past: n/a Patient is currently controlled on the following medications:   finasteride 5 mg daily  PSA  Date Value Ref Range Status  12/24/2015 0.68  Final   We discussed:  PSA is controlled, pt denies urinary symptoms other than frequency after torsemide dose.  Plan  Continue current medications    RLS   Patient has failed these meds in past: n/a Patient is currently controlled on the following medications:   ropinirole 1.5 mg HS (0.5 mg x 3)  We discussed:  Pt reports vast improvement in sleep and RLS since starting ropinirole.  Plan  Continue current medications   Allergies   Patient has failed these meds in past: n/a Patient is currently controlled on the following medications:   montelukast 10 mg daily,   loratadine 10 mg daily  We discussed:  Improved nasal drainage since starting  montelukast.  Plan  Continue current medications   Health Maintenance   Patient is currently controlled on the following medications:   Vitamin B12,   docusate 100 mg QOD,   multivitamin w/ iron,  We discussed:  Patient is satisfied with current OTC regimen and denies issues  Plan  Continue current medications    Medication Management   Pt uses [facility] for all medications Uses pill box Pt endorses 100% compliance  We discussed: pt is satisfied with current medication management strategy. Medications are mostly affordable except for Eliquis, which we will pursue PAP. [facility] is the preferred pharmacy with his insurance.  Plan  Continue current medication management strategy      Follow up: 3 month phone visit  Charlene Brooke, PharmD Clinical Pharmacist [facility] at [facility] [phone]

## 2019-08-20 NOTE — Patient Instructions (Addendum)
Visit Information  Goals Addressed            This Visit's Progress   . Pharmacy Care Plan       CARE PLAN ENTRY  Current Barriers:  . Chronic Disease Management support, education, and care coordination needs related to Hypertension, Hyperlipidemia, Diabetes, and Atrial Fibrillation   Hypertension/Atrial fibrillation . Pharmacist Clinical Goal(s): o Over the next 90 days, patient will work with PharmD and providers to maintain BP goal <130/80 . Current regimen:  o torsemide 20 mg x 3 daily,  o flecainide 50 mg every 12 hours  o Eliquis 5 mg twice a day o potassium 40 mEq at bedtime (20 meq x 2) . Interventions: o Discussed BP goal and benefits of maintaining BP in goal range, including stroke prevention o Pursue patient assistance for Eliquis . Patient self care activities - Over the next 90 days, patient will: o Check BP once weekly, document, and provide at future appointments o Ensure daily salt intake < 2300 mg/day o Complete patient section of Eliquis application and mail to manufacturer  Hyperlipidemia/history of stroke . Pharmacist Clinical Goal(s): o Over the next 90 days, patient will work with PharmD and providers to achieve LDL goal < 70 . Current regimen:  o Atorvastatin 80 mg daily . Interventions: o Discussed benefits of statin for cholesterol lowering and prevention of stroke . Patient self care activities - Over the next 90 days, patient will: o Continue medication as prescribed o Continue low cholesterol diet  Diabetes Hemoglobin A1C  Date Value Ref Range Status  11/14/2018 6.5 (A) 4.0 - 5.6 % Final .  Pharmacist Clinical Goal(s): o Over the next 90 days, patient will work with PharmD and providers to maintain A1c goal <6.5% . Current regimen:  o No medications indicated . Interventions: o Discussed benefits of weight loss for prevention of diabetes complications . Patient self care activities - Over the next 90 days, patient will: o Reduce  carbohydrates in diet o Increase exercise for weight loss  Medication management . Pharmacist Clinical Goal(s): o Over the next 90 days, patient will work with PharmD and providers to maintain optimal medication adherence . Current pharmacy: [facility] . Interventions o Comprehensive medication review performed. o Continue current medication management strategy . Patient self care activities - Over the next 90 days, patient will: o Focus on medication adherence by pill box o Take medications as prescribed o Report any questions or concerns to PharmD and/or provider(s)  Initial goal documentation       The patient verbalized understanding of instructions provided today and declined a print copy of patient instruction materials.   Telephone follow up appointment with pharmacy team member scheduled for: 3 months  Charlene Brooke, PharmD Clinical Pharmacist [facility] at [name] [phone]  Exercises To Do While Sitting  Exercises that you do while sitting (chair exercises) can give you many of the same benefits as full exercise. Benefits include strengthening your heart, burning calories, and keeping muscles and joints healthy. Exercise can also improve your mood and help with depression and anxiety. You may benefit from chair exercises if you are unable to do standing exercises because of:  Diabetic foot pain.  Obesity.  Illness.  Arthritis.  Recovery from surgery or injury.  Breathing problems.  Balance problems.  Another type of disability. Before starting chair exercises, check with your health care provider or a physical therapist to find out how much exercise you can tolerate and which exercises are safe  for you. If your health care provider approves:  Start out slowly and build up over time. Aim to work up to about 10-20 minutes for each exercise session.  Make exercise part of your daily routine.  Drink water when you exercise. Do not wait  until you are thirsty. Drink every 10-15 minutes.  Stop exercising right away if you have pain, nausea, shortness of breath, or dizziness.  If you are exercising in a wheelchair, make sure to lock the wheels.  Ask your health care provider whether you can do tai chi or yoga. Many positions in these mind-body exercises can be modified to do while seated. Warm-up Before starting other exercises: 1. Sit up as straight as you can. Have your knees bent at 90 degrees, which is the shape of the capital letter "L." Keep your feet flat on the floor. 2. Sit at the front edge of your chair, if you can. 3. Pull in (tighten) the muscles in your abdomen and stretch your spine and neck as straight as you can. Hold this position for a few minutes. 4. Breathe in and out evenly. Try to concentrate on your breathing, and relax your mind. Stretching Exercise A: Arm stretch 1. Hold your arms out straight in front of your body. 2. Bend your hands at the wrist with your fingers pointing up, as if signaling someone to stop. Notice the slight tension in your forearms as you hold the position. 3. Keeping your arms out and your hands bent, rotate your hands outward as far as you can and hold this stretch. Aim to have your thumbs pointing up and your pinkie fingers pointing down. Slowly repeat arm stretches for one minute as tolerated. Exercise B: Leg stretch 1. If you can move your legs, try to "draw" letters on the floor with the toes of your foot. Write your name with one foot. 2. Write your name with the toes of your other foot. Slowly repeat the movements for one minute as tolerated. Exercise C: Reach for the sky 1. Reach your hands as far over your head as you can to stretch your spine. 2. Move your hands and arms as if you are climbing a rope. Slowly repeat the movements for one minute as tolerated. Range of motion exercises Exercise A: Shoulder roll 1. Let your arms hang loosely at your sides. 2. Lift just  your shoulders up toward your ears, then let them relax back down. 3. When your shoulders feel loose, rotate your shoulders in backward and forward circles. Do shoulder rolls slowly for one minute as tolerated. Exercise B: March in place 1. As if you are marching, pump your arms and lift your legs up and down. Lift your knees as high as you can. ? If you are unable to lift your knees, just pump your arms and move your ankles and feet up and down. March in place for one minute as tolerated. Exercise C: Seated jumping jacks 1. Let your arms hang down straight. 2. Keeping your arms straight, lift them up over your head. Aim to point your fingers to the ceiling. 3. While you lift your arms, straighten your legs and slide your heels along the floor to your sides, as wide as you can. 4. As you bring your arms back down to your sides, slide your legs back together. ? If you are unable to use your legs, just move your arms. Slowly repeat seated jumping jacks for one minute as tolerated. Strengthening exercises Exercise A: Shoulder  squeeze 1. Hold your arms straight out from your body to your sides, with your elbows bent and your fists pointed at the ceiling. 2. Keeping your arms in the bent position, move them forward so your elbows and forearms meet in front of your face. 3. Open your arms back out as wide as you can with your elbows still bent, until you feel your shoulder blades squeezing together. Hold for 5 seconds. Slowly repeat the movements forward and backward for one minute as tolerated. Contact a health care provider if you:  Had to stop exercising due to any of the following: ? Pain. ? Nausea. ? Shortness of breath. ? Dizziness. ? Fatigue.  Have significant pain or soreness after exercising. Get help right away if you have:  Chest pain.  Difficulty breathing. These symptoms may represent a serious problem that is an emergency. Do not wait to see if the symptoms will go away.  Get medical help right away. Call your local emergency services (911 in the [place] Do not drive yourself to the hospital. This information is not intended to replace advice given to you by your health care provider. Make sure you discuss any questions you have with your health care provider. Document Revised: 07/11/2018 Document Reviewed: 01/31/2017 Elsevier Patient Education  2020 [employer].

## 2019-08-28 ENCOUNTER — Encounter: Payer: Medicare Other | Attending: Physical Medicine & Rehabilitation | Admitting: Physical Medicine & Rehabilitation

## 2019-08-28 ENCOUNTER — Other Ambulatory Visit: Payer: Self-pay

## 2019-08-28 ENCOUNTER — Encounter: Payer: Self-pay | Admitting: Physical Medicine & Rehabilitation

## 2019-08-28 DIAGNOSIS — M259 Joint disorder, unspecified: Secondary | ICD-10-CM | POA: Diagnosis not present

## 2019-08-28 DIAGNOSIS — Z5181 Encounter for therapeutic drug level monitoring: Secondary | ICD-10-CM | POA: Diagnosis not present

## 2019-08-28 DIAGNOSIS — R269 Unspecified abnormalities of gait and mobility: Secondary | ICD-10-CM

## 2019-08-28 DIAGNOSIS — G894 Chronic pain syndrome: Secondary | ICD-10-CM | POA: Insufficient documentation

## 2019-08-28 DIAGNOSIS — G464 Cerebellar stroke syndrome: Secondary | ICD-10-CM

## 2019-08-28 DIAGNOSIS — G463 Brain stem stroke syndrome: Secondary | ICD-10-CM

## 2019-08-28 DIAGNOSIS — Z79891 Long term (current) use of opiate analgesic: Secondary | ICD-10-CM | POA: Diagnosis not present

## 2019-08-28 DIAGNOSIS — M47817 Spondylosis without myelopathy or radiculopathy, lumbosacral region: Secondary | ICD-10-CM

## 2019-08-28 DIAGNOSIS — I69398 Other sequelae of cerebral infarction: Secondary | ICD-10-CM | POA: Diagnosis not present

## 2019-08-28 NOTE — Progress Notes (Signed)
74 year old male with history of atrial fibrillation pulmonary emboli maintained on Eliquis with history of hypertension obstructive sleep apnea morbid obesity and diastolic congestive heart failure returns today for primary problem of chronic low back pain. In addition the patient has had left lateral medullary infarct and underwent inpatient rehabilitation on stroke unit at [facility] from 06/21/2016 to 07/12/2016.  Patient indicates that currently his pain is in the 3-4 out of 10 range.  He is quite happy about this. He is no longer taking hydrocodone He takes approximately 2 tramadol per day The patient had a fall at home but was not using his walker.  He was using his cane and trying to get mail out of the refrigerator to put in his coffee.  Patient had no significant injury  Last sacroiliac injection on the left was performed 09/27/2018 Last right sided medial branch blocks L3-L4-L5 performed on 09/12/2018  Interval medical history was seen by podiatry for left ankle joint synovitis and DJD.  Left ankle injection was performed as well as a Medrol dose pack prescribed.  Patient feels like his ankle is knees as well as back pain have improved since the dose pack.  We discussed that this is not something he should be taking on an ongoing basis.  Examination Morbidly obese male no acute distress Mood and affect are appropriate The patient has no tenderness along the cervical thoracic or upper lumbar spine there is mild tenderness to palpation around L4 and L5 paraspinal region but no pain over the spinous processes Negative straight leg raising test Sensation is reduced below the knees bilaterally to light touch There is 2+ pitting edema bilateral pretibial  #1.  Lumbar spondylosis without myelopathy overall doing well no need for reinjection at this time approximately 1 year post right-sided L3-L4 medial branch L5 dorsal ramus injections under fluoroscopic guidance Continue tramadol 50  mg twice daily He is no longer taking the hydrocodone and still has at least half the prescription on used from 07/25/2019 PDMP reviewed His psychiatrist prescribes Ativan but has been on a stable dose.  No signs of sedation clinic today 2. Sacroiliac disorder left side also doing well no need for reinjection  3.  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  He has chronic balance deficits.  His balance is also affected by severe diabetic peripheral neuropathy.  He should be using a walker at all times and at this assist with him.  Return to clinic in 6 months

## 2019-09-06 ENCOUNTER — Other Ambulatory Visit: Payer: Self-pay | Admitting: Internal Medicine

## 2019-10-13 ENCOUNTER — Other Ambulatory Visit: Payer: Self-pay | Admitting: Internal Medicine

## 2019-10-13 DIAGNOSIS — J309 Allergic rhinitis, unspecified: Secondary | ICD-10-CM

## 2019-10-13 DIAGNOSIS — H1010 Acute atopic conjunctivitis, unspecified eye: Secondary | ICD-10-CM

## 2019-10-13 DIAGNOSIS — E785 Hyperlipidemia, unspecified: Secondary | ICD-10-CM

## 2019-10-18 ENCOUNTER — Other Ambulatory Visit: Payer: Self-pay | Admitting: Internal Medicine

## 2019-10-18 DIAGNOSIS — K2100 Gastro-esophageal reflux disease with esophagitis, without bleeding: Secondary | ICD-10-CM

## 2019-10-21 NOTE — Telephone Encounter (Signed)
Dr. Annamaria Boots, please advise if you are okay refilling med.  Allergies  Allergen Reactions  . Morphine Nausea And Vomiting and Other (See Comments)    Severe hallucinations  . Codeine Nausea Only  . Penicillins Other (See Comments)    Passed out after injection Did it involve swelling of the face/tongue/throat, SOB, or low BP? Unknown Did it involve sudden or severe rash/hives, skin peeling, or any reaction on the inside of your mouth or nose? No Did you need to seek medical attention at a hospital or doctor's office? Yes When did it last happen?teenager If all above answers are "NO", may proceed with cephalosporin use.  [name] Propoxyphene N-Acetaminophen Nausea Only     Current Outpatient Medications:  .  apixaban (ELIQUIS) 5 MG TABS tablet, Take 1 tablet (5 mg total) by mouth 2 (two) times daily., Disp: 180 tablet, Rfl: 1 .  atorvastatin (LIPITOR) 80 MG tablet, Take 1 tablet by mouth once daily, Disp: 90 tablet, Rfl: 0 .  Cyanocobalamin (VITAMIN B-12 PO), Take 1 tablet by mouth at bedtime., Disp: , Rfl:  .  Docusate Sodium (DSS) 100 MG CAPS, Take 100 mg by mouth every other day., Disp: , Rfl:  .  finasteride (PROSCAR) 5 MG tablet, Take 1 tablet (5 mg total) by mouth daily., Disp: 30 tablet, Rfl: 1 .  flecainide (TAMBOCOR) 50 MG tablet, Take 1 tablet (50 mg total) by mouth every 12 (twelve) hours. OV NEEED, Disp: 180 tablet, Rfl: 3 .  HYDROcodone-acetaminophen (NORCO/VICODIN) 5-325 MG tablet, Take 1 tablet by mouth every 6 (six) hours as needed., Disp: 20 tablet, Rfl: 0 .  lidocaine (LIDODERM) 5 %, Place 1 patch onto the skin daily. Remove & Discard patch within 12 hours or as directed by MD, Disp: 12 patch, Rfl: 0 .  loratadine (CLARITIN) 10 MG tablet, Take 10 mg by mouth daily., Disp: , Rfl:  .  LORazepam (ATIVAN) 0.5 MG tablet, Take 1 tablet (0.5 mg total) by mouth at bedtime., Disp: 90 tablet, Rfl: 1 .  methylPREDNISolone (MEDROL DOSEPAK) 4 MG TBPK tablet, 6 day dose pack - take as  directed, Disp: 21 tablet, Rfl: 0 .  montelukast (SINGULAIR) 10 MG tablet, TAKE 1 TABLET BY MOUTH AT BEDTIME, Disp: 90 tablet, Rfl: 0 .  OVER THE COUNTER MEDICATION, Take 1 tablet by mouth daily. Multi vitamin with iron, Disp: , Rfl:  .  pantoprazole (PROTONIX) 40 MG tablet, Take 1 tablet by mouth once daily, Disp: 90 tablet, Rfl: 0 .  potassium chloride SA (KLOR-CON) 20 MEQ tablet, TAKE 1 TABLET BY MOUTH TWICE DAILY **AFTERNOON  AND  BEDTIME**, Disp: 180 tablet, Rfl: 2 .  rOPINIRole (REQUIP) 0.5 MG tablet, Take 1 tablet (0.5 mg total) by mouth 3 (three) times daily. (Patient taking differently: Take 1.5 mg by mouth at bedtime. ), Disp: 270 tablet, Rfl: 3 .  sertraline (ZOLOFT) 100 MG tablet, Take 1.5 tablets (150 mg total) by mouth at bedtime., Disp: 135 tablet, Rfl: 1 .  torsemide (DEMADEX) 20 MG tablet, TAKE 3 TABLETS BY MOUTH ONCE DAILY. KEEP APPOINTMENT AS SCHEDULED, Disp: 90 tablet, Rfl: 11 .  traMADol (ULTRAM) 50 MG tablet, TAKE 1 TABLET BY MOUTH EVERY 8 HOURS AS NEEDED, Disp: 90 tablet, Rfl: 2

## 2019-10-21 NOTE — Telephone Encounter (Signed)
ropinerole refilled

## 2019-10-25 ENCOUNTER — Other Ambulatory Visit (HOSPITAL_COMMUNITY): Payer: Self-pay | Admitting: Psychiatry

## 2019-10-25 DIAGNOSIS — F330 Major depressive disorder, recurrent, mild: Secondary | ICD-10-CM

## 2019-10-27 ENCOUNTER — Ambulatory Visit (INDEPENDENT_AMBULATORY_CARE_PROVIDER_SITE_OTHER): Payer: Medicare Other | Admitting: Internal Medicine

## 2019-10-27 ENCOUNTER — Other Ambulatory Visit: Payer: Self-pay

## 2019-10-27 ENCOUNTER — Encounter: Payer: Self-pay | Admitting: Internal Medicine

## 2019-10-27 ENCOUNTER — Ambulatory Visit (INDEPENDENT_AMBULATORY_CARE_PROVIDER_SITE_OTHER): Payer: Medicare Other

## 2019-10-27 VITALS — BP 140/64 | HR 72 | Temp 99.1°F | Ht 72.0 in | Wt 394.0 lb

## 2019-10-27 VITALS — BP 140/64 | HR 72 | Temp 99.1°F | Ht 72.0 in | Wt 394.6 lb

## 2019-10-27 DIAGNOSIS — N401 Enlarged prostate with lower urinary tract symptoms: Secondary | ICD-10-CM

## 2019-10-27 DIAGNOSIS — R3911 Hesitancy of micturition: Secondary | ICD-10-CM

## 2019-10-27 DIAGNOSIS — E785 Hyperlipidemia, unspecified: Secondary | ICD-10-CM

## 2019-10-27 DIAGNOSIS — Z0000 Encounter for general adult medical examination without abnormal findings: Secondary | ICD-10-CM | POA: Diagnosis not present

## 2019-10-27 DIAGNOSIS — E538 Deficiency of other specified B group vitamins: Secondary | ICD-10-CM

## 2019-10-27 DIAGNOSIS — E118 Type 2 diabetes mellitus with unspecified complications: Secondary | ICD-10-CM

## 2019-10-27 DIAGNOSIS — G464 Cerebellar stroke syndrome: Secondary | ICD-10-CM

## 2019-10-27 DIAGNOSIS — I10 Essential (primary) hypertension: Secondary | ICD-10-CM | POA: Diagnosis not present

## 2019-10-27 DIAGNOSIS — Z23 Encounter for immunization: Secondary | ICD-10-CM

## 2019-10-27 DIAGNOSIS — Z9884 Bariatric surgery status: Secondary | ICD-10-CM

## 2019-10-27 NOTE — Progress Notes (Signed)
Subjective:  Patient ID: [name], male    DOB: 1945-06-08  Age: 74 y.o. MRN: [id number]  CC: Hypertension, Hyperlipidemia, and Diabetes  This visit occurred during the SARS-CoV-2 public health emergency.  Safety protocols were in place, including screening questions prior to the visit, additional usage of staff PPE, and extensive cleaning of exam room while observing appropriate contact time as indicated for disinfecting solutions.    HPI DVID PENDRY presents for f/up - He has a myriad of chronic, unchanged symptoms including fatigue, dizziness, lower extremity numbness, declining memory, insomnia, and lower extremity edema. He denies any recent episodes of chest pain, cough, palpitations, dyspnea on exertion, weight gain, dizziness, or lightheadedness.  Outpatient Medications Prior to Visit  Medication Sig Dispense Refill  . apixaban (ELIQUIS) 5 MG TABS tablet Take 1 tablet (5 mg total) by mouth 2 (two) times daily. 180 tablet 1  . atorvastatin (LIPITOR) 80 MG tablet Take 1 tablet by mouth once daily 90 tablet 0  . Docusate Sodium (DSS) 100 MG CAPS Take 100 mg by mouth every other day.    . finasteride (PROSCAR) 5 MG tablet Take 1 tablet (5 mg total) by mouth daily. 30 tablet 1  . flecainide (TAMBOCOR) 50 MG tablet Take 1 tablet (50 mg total) by mouth every 12 (twelve) hours. OV NEEED 180 tablet 3  . HYDROcodone-acetaminophen (NORCO/VICODIN) 5-325 MG tablet Take 1 tablet by mouth every 6 (six) hours as needed. 20 tablet 0  . lidocaine (LIDODERM) 5 % Place 1 patch onto the skin daily. Remove & Discard patch within 12 hours or as directed by MD 12 patch 0  . loratadine (CLARITIN) 10 MG tablet Take 10 mg by mouth daily.    [name] LORazepam (ATIVAN) 0.5 MG tablet Take 1 tablet (0.5 mg total) by mouth at bedtime. 90 tablet 1  . montelukast (SINGULAIR) 10 MG tablet TAKE 1 TABLET BY MOUTH AT BEDTIME 90 tablet 0  . OVER THE COUNTER MEDICATION Take 1 tablet by mouth daily. Multi vitamin  with iron    . pantoprazole (PROTONIX) 40 MG tablet Take 1 tablet by mouth once daily 90 tablet 0  . potassium chloride SA (KLOR-CON) 20 MEQ tablet TAKE 1 TABLET BY MOUTH TWICE DAILY **AFTERNOON  AND  BEDTIME** 180 tablet 2  . rOPINIRole (REQUIP) 0.5 MG tablet TAKE 1 TABLET BY MOUTH THREE TIMES DAILY 270 tablet 5  . sertraline (ZOLOFT) 100 MG tablet Take 1.5 tablets (150 mg total) by mouth at bedtime. 135 tablet 1  . torsemide (DEMADEX) 20 MG tablet TAKE 3 TABLETS BY MOUTH ONCE DAILY. KEEP APPOINTMENT AS SCHEDULED 90 tablet 11  . Cyanocobalamin (VITAMIN B-12 PO) Take 1 tablet by mouth at bedtime.    . methylPREDNISolone (MEDROL DOSEPAK) 4 MG TBPK tablet 6 day dose pack - take as directed 21 tablet 0  . traMADol (ULTRAM) 50 MG tablet TAKE 1 TABLET BY MOUTH EVERY 8 HOURS AS NEEDED 90 tablet 2   No facility-administered medications prior to visit.    ROS Review of Systems  Constitutional: Positive for fatigue. Negative for diaphoresis.  HENT: Negative.   Eyes: Negative for visual disturbance.  Respiratory: Negative for cough, chest tightness, shortness of breath and wheezing.   Cardiovascular: Positive for leg swelling. Negative for chest pain and palpitations.  Gastrointestinal: Negative for abdominal pain, constipation, diarrhea, nausea and vomiting.  Endocrine: Negative.   Genitourinary: Negative.  Negative for difficulty urinating.  Musculoskeletal: Negative.  Negative for arthralgias and myalgias.  Skin: Negative.  Negative for color change and pallor.  Neurological: Positive for dizziness and numbness. Negative for weakness, light-headedness and headaches.  Hematological: Negative for adenopathy. Does not bruise/bleed easily.  Psychiatric/Behavioral: Positive for sleep disturbance. Negative for confusion, dysphoric mood and suicidal ideas. The patient is not nervous/anxious.     Objective:  BP (!) 140/64 (BP Location: Left Arm, Patient Position: Sitting, Cuff Size: Large)   Pulse  72   Temp 99.1 F (37.3 C) (Oral)   Ht 6' (1.829 m)   Wt (!) 394 lb (178.7 kg)   SpO2 95%   BMI 53.44 kg/m   BP Readings from Last 3 Encounters:  10/27/19 (!) 140/64  10/27/19 (!) 140/64  07/29/19 (!) 147/67    Wt Readings from Last 3 Encounters:  10/27/19 (!) 394 lb (178.7 kg)  10/27/19 (!) 394 lb 9.6 oz (179 kg)  07/29/19 (!) 393 lb 12.8 oz (178.6 kg)    Physical Exam Vitals reviewed.  Constitutional:      Appearance: He is obese.  HENT:     Nose: Nose normal.     Mouth/Throat:     Mouth: Mucous membranes are moist.  Eyes:     General: No scleral icterus.    Conjunctiva/sclera: Conjunctivae normal.  Cardiovascular:     Rate and Rhythm: Normal rate and regular rhythm.     Heart sounds: No murmur heard.   Pulmonary:     Breath sounds: No wheezing, rhonchi or rales.  Abdominal:     General: Abdomen is protuberant. Bowel sounds are normal. There is no distension.     Palpations: Abdomen is soft. There is no hepatomegaly, splenomegaly or mass.     Tenderness: There is no guarding.  Musculoskeletal:     Cervical back: Neck supple.     Right lower leg: Edema (trace pitting) present.     Left lower leg: Edema (trace pitting) present.  Lymphadenopathy:     Cervical: No cervical adenopathy.  Skin:    General: Skin is warm and dry.     Coloration: Skin is not pale.  Neurological:     General: No focal deficit present.     Mental Status: He is alert and oriented to person, place, and time. Mental status is at baseline.     Lab Results  Component Value Date   WBC 11.5 (H) 10/27/2019   HGB 13.4 10/27/2019   HCT 41.5 10/27/2019   PLT 242 10/27/2019   GLUCOSE 108 (H) 10/27/2019   CHOL 132 10/27/2019   TRIG 191 (H) 10/27/2019   HDL 29 (L) 10/27/2019   LDLDIRECT 91.0 05/16/2018   LDLCALC 74 10/27/2019   ALT 18 10/27/2019   AST 21 10/27/2019   NA 143 10/27/2019   K 4.1 10/27/2019   CL 103 10/27/2019   CREATININE 1.18 10/27/2019   BUN 24 10/27/2019   CO2 30  10/27/2019   TSH 4.33 10/27/2019   PSA 0.68 12/24/2015   INR 1.09 06/19/2016   HGBA1C 6.4 (H) 10/27/2019   MICROALBUR 7.1 10/27/2019    No results found.  Assessment & Plan:   Yousef was seen today for hypertension, hyperlipidemia and diabetes.  Diagnoses and all orders for this visit:  Benign essential HTN - His BP is adequately well controlled.  -     CBC with Differential/Platelet; Future -     TSH; Future -     BASIC METABOLIC PANEL WITH GFR; Future -     BASIC METABOLIC PANEL WITH GFR -  TSH -     CBC with Differential/Platelet  Type II diabetes mellitus with manifestations ([place])- His A1C is at 6.4%. his blood sugars are well controlled.  -     Hemoglobin A1c; Future -     Urinalysis, Routine w reflex microscopic; Future -     Microalbumin / creatinine urine ratio; Future -     HM Diabetes Foot Exam -     Microalbumin / creatinine urine ratio -     Urinalysis, Routine w reflex microscopic -     Hemoglobin A1c  Benign prostatic hyperplasia with urinary hesitancy  B12 deficiency- His H/H, B12, and folate levels are normal -     Folate; Future -     Vitamin B12; Future -     Vitamin B12 -     Folate  Hyperlipidemia with target LDL less than 130-  He has achieved his LDL goal and is doing well on the statin. -     Lipid panel; Future -     Hepatic function panel; Future -     TSH; Future -     TSH -     Hepatic function panel -     Lipid panel  H/O laparoscopic adjustable gastric banding & hiatal hernia repair 04/08/13  Need for pneumococcal vaccination -     Pneumococcal polysaccharide vaccine 23-valent greater than or equal to 2yo subcutaneous/IM  Other orders -     MICROSCOPIC MESSAGE   I have discontinued Doren Custard A. Huckaba "[name]"'s Cyanocobalamin (VITAMIN B-12 PO) and methylPREDNISolone. I am also having him maintain his DSS, loratadine, finasteride, OVER THE COUNTER MEDICATION, lidocaine, apixaban, sertraline, LORazepam, flecainide, torsemide,  HYDROcodone-acetaminophen, potassium chloride SA, atorvastatin, montelukast, rOPINIRole, and pantoprazole.  No orders of the defined types were placed in this encounter.    Follow-up: Return in about 6 months (around 04/28/2020).  [name], MD

## 2019-10-27 NOTE — Patient Instructions (Signed)
Type 2 Diabetes Mellitus, Diagnosis, Adult Type 2 diabetes (type 2 diabetes mellitus) is a long-term (chronic) disease. In type 2 diabetes, one or both of these problems may be present:  The pancreas does not make enough of a hormone called insulin.  Cells in the body do not respond properly to insulin that the body makes (insulin resistance). Normally, insulin allows blood sugar (glucose) to enter cells in the body. The cells use glucose for energy. Insulin resistance or lack of insulin causes excess glucose to build up in the blood instead of going into cells. As a result, high blood glucose (hyperglycemia) develops. What increases the risk? The following factors may make you more likely to develop type 2 diabetes:  Having a family member with type 2 diabetes.  Being overweight or obese.  Having an inactive (sedentary) lifestyle.  Having been diagnosed with insulin resistance.  Having a history of prediabetes, gestational diabetes, or polycystic ovary syndrome (PCOS).  Being of American-Indian, African-American, Hispanic/Latino, or Asian/Pacific Islander descent. What are the signs or symptoms? In the early stage of this condition, you may not have symptoms. Symptoms develop slowly and may include:  Increased thirst (polydipsia).  Increased hunger(polyphagia).  Increased urination (polyuria).  Increased urination during the night (nocturia).  Unexplained weight loss.  Frequent infections that keep coming back (recurring).  Fatigue.  Weakness.  Vision changes, such as blurry vision.  Cuts or bruises that are slow to heal.  Tingling or numbness in the hands or feet.  Dark patches on the skin (acanthosis nigricans). How is this diagnosed? This condition is diagnosed based on your symptoms, your medical history, a physical exam, and your blood glucose level. Your blood glucose may be checked with one or more of the following blood tests:  A fasting blood glucose (FBG)  test. You will not be allowed to eat (you will fast) for 8 hours or longer before a blood sample is taken.  A random blood glucose test. This test checks blood glucose at any time of day regardless of when you ate.  An A1c (hemoglobin A1c) blood test. This test provides information about blood glucose control over the previous 2-3 months.  An oral glucose tolerance test (OGTT). This test measures your blood glucose at two times: ? After fasting. This is your baseline blood glucose level. ? Two hours after drinking a beverage that contains glucose. You may be diagnosed with type 2 diabetes if:  Your FBG level is 126 mg/dL (7.0 mmol/L) or higher.  Your random blood glucose level is 200 mg/dL (11.1 mmol/L) or higher.  Your A1c level is 6.5% or higher.  Your OGTT result is higher than 200 mg/dL (11.1 mmol/L). These blood tests may be repeated to confirm your diagnosis. How is this treated? Your treatment may be managed by a specialist called an endocrinologist. Type 2 diabetes may be treated by following instructions from your health care provider about:  Making diet and lifestyle changes. This may include: ? Following an individualized nutrition plan that is developed by a diet and nutrition specialist (registered dietitian). ? Exercising regularly. ? Finding ways to manage stress.  Checking your blood glucose level as often as told.  Taking diabetes medicines or insulin daily. This helps to keep your blood glucose levels in the healthy range. ? If you use insulin, you may need to adjust the dosage depending on how physically active you are and what foods you eat. Your health care provider will tell you how to adjust your dosage.    Taking medicines to help prevent complications from diabetes, such as: ? Aspirin. ? Medicine to lower cholesterol. ? Medicine to control blood pressure. Your health care provider will set individualized treatment goals for you. Your goals will be based on  your age, other medical conditions you have, and how you respond to diabetes treatment. Generally, the goal of treatment is to maintain the following blood glucose levels:  Before meals (preprandial): 80-130 mg/dL (4.4-7.2 mmol/L).  After meals (postprandial): below 180 mg/dL (10 mmol/L).  A1c level: less than 7%. Follow these instructions at home: Questions to ask your health care provider  Consider asking the following questions: ? Do I need to meet with a diabetes educator? ? Where can I find a support group for people with diabetes? ? What equipment will I need to manage my diabetes at home? ? What diabetes medicines do I need, and when should I take them? ? How often do I need to check my blood glucose? ? What number can I call if I have questions? ? When is my next appointment? General instructions  Take over-the-counter and prescription medicines only as told by your health care provider.  Keep all follow-up visits as told by your health care provider. This is important.  For more information about diabetes, visit: ? American Diabetes Association (ADA): www.diabetes.org ? American Association of Diabetes Educators (AADE): www.diabeteseducator.org Contact a health care provider if:  Your blood glucose is at or above 240 mg/dL (13.3 mmol/L) for 2 days in a row.  You have been sick or have had a fever for 2 days or longer, and you are not getting better.  You have any of the following problems for more than 6 hours: ? You cannot eat or drink. ? You have nausea and vomiting. ? You have diarrhea. Get help right away if:  Your blood glucose is lower than 54 mg/dL (3.0 mmol/L).  You become confused or you have trouble thinking clearly.  You have difficulty breathing.  You have moderate or large ketone levels in your urine. Summary  Type 2 diabetes (type 2 diabetes mellitus) is a long-term (chronic) disease. In type 2 diabetes, the pancreas does not make enough of a  hormone called insulin, or cells in the body do not respond properly to insulin that the body makes (insulin resistance).  This condition is treated by making diet and lifestyle changes and taking diabetes medicines or insulin.  Your health care provider will set individualized treatment goals for you. Your goals will be based on your age, other medical conditions you have, and how you respond to diabetes treatment.  Keep all follow-up visits as told by your health care provider. This is important. This information is not intended to replace advice given to you by your health care provider. Make sure you discuss any questions you have with your health care provider. Document Revised: 05/18/2017 Document Reviewed: 04/23/2015 Elsevier Patient Education  2020 [employer]  

## 2019-10-27 NOTE — Progress Notes (Signed)
Subjective:   [name] is a 74 y.o. male who presents for Medicare Annual/Subsequent preventive examination.  Review of Systems    No ROS. Medicare Wellness Visit. Cardiac Risk Factors include: advanced age (>44men, >57 women);diabetes mellitus;dyslipidemia;family history of premature cardiovascular disease;hypertension;male gender;obesity (BMI >30kg/m2)     Objective:    Today's Vitals   10/27/19 1237 10/27/19 1240  BP: (!) 140/64   Pulse: 72   Temp: 99.1 F (37.3 C)   SpO2: 95%   Weight: (!) 394 lb 9.6 oz (179 kg)   Height: 6' (1.829 m)   PainSc: 0-No pain 0-No pain   Body mass index is 53.52 kg/m.  Advanced Directives 10/27/2019 05/04/2019 10/13/2018 05/16/2018 12/12/2016 12/12/2016 11/03/2016  Does Patient Have a Medical Advance Directive? No No No No No No No  Would patient like information on creating a medical advance directive? Yes (MAU/Ambulatory/Procedural Areas - Information given) No - Patient declined - Yes (ED - Information included in AVS) - - -  Pre-existing out of facility DNR order (yellow form or pink MOST form) - - - - - - -  Some encounter information is confidential and restricted. Go to Review Flowsheets activity to see all data.    Current Medications (verified) Outpatient Encounter Medications as of 10/27/2019  Medication Sig  . apixaban (ELIQUIS) 5 MG TABS tablet Take 1 tablet (5 mg total) by mouth 2 (two) times daily.  [name] atorvastatin (LIPITOR) 80 MG tablet Take 1 tablet by mouth once daily  . Cyanocobalamin (VITAMIN B-12 PO) Take 1 tablet by mouth at bedtime.  [name] Sodium (DSS) 100 MG CAPS Take 100 mg by mouth every other day.  . finasteride (PROSCAR) 5 MG tablet Take 1 tablet (5 mg total) by mouth daily.  . flecainide (TAMBOCOR) 50 MG tablet Take 1 tablet (50 mg total) by mouth every 12 (twelve) hours. OV NEEED  . HYDROcodone-acetaminophen (NORCO/VICODIN) 5-325 MG tablet Take 1 tablet by mouth every 6 (six) hours as needed.  .  lidocaine (LIDODERM) 5 % Place 1 patch onto the skin daily. Remove & Discard patch within 12 hours or as directed by MD  . loratadine (CLARITIN) 10 MG tablet Take 10 mg by mouth daily.  [name] LORazepam (ATIVAN) 0.5 MG tablet Take 1 tablet (0.5 mg total) by mouth at bedtime.  . methylPREDNISolone (MEDROL DOSEPAK) 4 MG TBPK tablet 6 day dose pack - take as directed  . montelukast (SINGULAIR) 10 MG tablet TAKE 1 TABLET BY MOUTH AT BEDTIME  . OVER THE COUNTER MEDICATION Take 1 tablet by mouth daily. Multi vitamin with iron  . pantoprazole (PROTONIX) 40 MG tablet Take 1 tablet by mouth once daily  . potassium chloride SA (KLOR-CON) 20 MEQ tablet TAKE 1 TABLET BY MOUTH TWICE DAILY **AFTERNOON  AND  BEDTIME**  . rOPINIRole (REQUIP) 0.5 MG tablet TAKE 1 TABLET BY MOUTH THREE TIMES DAILY  . sertraline (ZOLOFT) 100 MG tablet Take 1.5 tablets (150 mg total) by mouth at bedtime.  . torsemide (DEMADEX) 20 MG tablet TAKE 3 TABLETS BY MOUTH ONCE DAILY. KEEP APPOINTMENT AS SCHEDULED  . traMADol (ULTRAM) 50 MG tablet TAKE 1 TABLET BY MOUTH EVERY 8 HOURS AS NEEDED  . [DISCONTINUED] atorvastatin (LIPITOR) 80 MG tablet Take 1 tablet (80 mg total) by mouth daily.  . [DISCONTINUED] montelukast (SINGULAIR) 10 MG tablet Take 1 tablet (10 mg total) by mouth at bedtime.  . [DISCONTINUED] pantoprazole (PROTONIX) 40 MG tablet Take 1 tablet by mouth once daily  No facility-administered encounter medications on file as of 10/27/2019.    Allergies (verified) Morphine, Codeine, Penicillins, and Propoxyphene n-acetaminophen   History: Past Medical History:  Diagnosis Date  . Anemia 07/29/2013  . Asthma    childhood asthma  . Benign essential HTN   . Benign prostatic hypertrophy    several prostate biopsies neg for cancer.   . Bilateral hip pain   . Cancer ([place])   . CHF (congestive heart failure) ([place])   . Chronic anticoagulation 07/22/2013  . Complication of anesthesia    MORPHINE CAUSES HALLUCINATIONS  . CVA  (cerebral vascular accident) ([place]) 06/2016  . Depression   . Diastolic congestive heart failure ([place]) 07/16/2013  . Dysphagia, post-stroke   . Embolic cerebral infarction ([place]) 06/21/2016  . FH: colonic polyps 2010  . Gout   . History of kidney stones   . Hypertension   . Kidney cysts   . Morbid obesity ([place])   . Numbness    feet / legs  . PAF (paroxysmal atrial fibrillation) with RVR 07/29/2013  . Pericarditis 2015   s/p window  . Recurrent pulmonary emboli ([place]) 06/13/2015  . Sleep apnea    USES C PAP  . Stroke syndrome   . 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 ARTHROSCOPY KNEE W/ DRILLING    . BREATH TEK H PYLORI N/A 12/23/2012   Procedure: BREATH TEK H PYLORI;  Surgeon: Gayland Curry, MD;  Location: [facility];  Service: General;  Laterality: N/A;  . CHOLECYSTECTOMY  1989  . COLONOSCOPY N/A 07/31/2013   Procedure: COLONOSCOPY;  Surgeon: Gatha Mayer, MD;  Location: [facility];  Service: Endoscopy;  Laterality: N/A;  . ESOPHAGOGASTRODUODENOSCOPY N/A 07/22/2013   Procedure: ESOPHAGOGASTRODUODENOSCOPY (EGD);  Surgeon: Irene Shipper, MD;  Location: [facility];  Service: Endoscopy;  Laterality: N/A;  . ESOPHAGOGASTRODUODENOSCOPY N/A 07/31/2013   Procedure: ESOPHAGOGASTRODUODENOSCOPY (EGD);  Surgeon: Gatha Mayer, MD;  Location: [facility] ENDOSCOPY;  Service: Endoscopy;  Laterality: N/A;  . INTRAOPERATIVE TRANSESOPHAGEAL ECHOCARDIOGRAM N/A 07/18/2013   Procedure: INTRAOPERATIVE TRANSESOPHAGEAL ECHOCARDIOGRAM;  Surgeon: Grace Isaac, MD;  Location: [address];  Service: Open Heart Surgery;  Laterality: N/A;  . LAPAROSCOPIC GASTRIC BANDING WITH HIATAL HERNIA REPAIR N/A 04/08/2013   Procedure: LAPAROSCOPIC GASTRIC BANDING WITH possible  HIATAL HERNIA REPAIR;  Surgeon: Gayland Curry, MD;  Location: [facility];  Service: General;  Laterality: N/A;  . LITHOTRIPSY    . renal calculi    . SUBXYPHOID PERICARDIAL WINDOW N/A 07/18/2013   Procedure:  SUBXYPHOID PERICARDIAL WINDOW;  Surgeon: Grace Isaac, MD;  Location: [facility];  Service: Thoracic;  Laterality: N/A;  . TONSILLECTOMY    . total knee replacment     bilat  . trigger finger repair     also lue, cts surgically repaired  . WISDOM TOOTH EXTRACTION     Family History  Problem Relation Age of Onset  . Depression Mother   . Hypertension Mother   . Dementia Mother   . Heart disease Father        MI  . Depression Brother   . Stroke Paternal Aunt        CVA  . Diabetes Paternal Uncle   . Heart disease Paternal Uncle        MI  . Heart disease Paternal Grandmother        MI  . Diabetes Cousin        PATERNAL   Social History  Socioeconomic History  . Marital status: Married    Spouse name: Not on file  . Number of children: 1  . Years of education: Not on file  . Highest education level: Not on file  Occupational History  . Occupation: retired-Manager [employer]  Tobacco Use  . Smoking status: Former Smoker    Packs/day: 0.20    Years: 13.00    Pack years: 2.60    Types: Cigarettes    Start date: 04/03/1961    Quit date: 08/30/1974    Years since quitting: 45.1  . Smokeless tobacco: Never Used  Vaping Use  . Vaping Use: Never used  Substance and Sexual Activity  . Alcohol use: No    Alcohol/week: 0.0 standard drinks  . Drug use: No  . Sexual activity: Not Currently  Other Topics Concern  . Not on file  Social History Narrative   Lives at home w/ his wife   Right-handed   Caffeine: decaf coffee, occasional tea   Social Determinants of Health   Financial Resource Strain: Low Risk   . Difficulty of Paying Living Expenses: Not very hard  Food Insecurity: No Food Insecurity  . Worried About Charity fundraiser in the Last Year: Never true  . Ran Out of Food in the Last Year: Never true  Transportation Needs: No Transportation Needs  . Lack of Transportation (Medical): No  . Lack of Transportation (Non-Medical): No  Physical Activity: Inactive  .  Days of Exercise per Week: 0 days  . Minutes of Exercise per Session: 0 min  Stress: Stress Concern Present  . Feeling of Stress : Rather much  Social Connections:   . Frequency of Communication with Friends and Family:   . Frequency of Social Gatherings with Friends and Family:   . Attends Religious Services:   . Active Member of Clubs or Organizations:   . Attends Archivist Meetings:   [name] Marital Status:     Tobacco Counseling Counseling given: No   Clinical Intake:  Pre-visit preparation completed: Yes  Pain : No/denies pain Pain Score: 0-No pain     BMI - recorded: 53.52 Nutritional Status: BMI > 30  Obese Nutritional Risks: None Diabetes: Yes CBG done?: No Did pt. bring in CBG monitor from home?: No  How often do you need to have someone help you when you read instructions, pamphlets, or other written materials from your doctor or pharmacy?: 1 - Never What is the last grade level you completed in school?: Bachelor's Degree from [employer] in History & Economics  Diabetic? yes  Interpreter Needed?: No  Information entered by :: [employer]. Osha Rane, LPN   Activities of Daily Living In your present state of health, do you have any difficulty performing the following activities: 10/27/2019  Hearing? N  Vision? N  Difficulty concentrating or making decisions? Y  Walking or climbing stairs? Y  Dressing or bathing? N  Doing errands, shopping? N  Preparing Food and eating ? N  Using the Toilet? N  In the past six months, have you accidently leaked urine? N  Do you have problems with loss of bowel control? N  Managing your Medications? N  Managing your Finances? N  Housekeeping or managing your Housekeeping? N  Some recent data might be hidden    Patient Care Team: Janith Lima, MD as PCP - General (Internal Medicine) Debara Pickett [name], MD as PCP - Cardiology (Cardiology) Himmelrich, Bryson Ha, RD (Inactive) as Dietitian (Bariatrics) Benjamine Mola,  [name], MD as Consulting Physician (Otolaryngology) Kathlee Nations, MD as Consulting Physician (Psychiatry) Debara Pickett [name], MD as Consulting Physician (Cardiology) Chyrel Masson, DO (Otolaryngology) Deneise Lever, MD as Consulting Physician (Pulmonary Disease) Charlton Haws, [facility] as Pharmacist (Pharmacist)  Indicate any recent Medical Services you may have received from other than [facility] providers in the past year (date may be approximate).     Assessment:   This is a routine wellness examination for [place].  Hearing/Vision screen No exam data present  Dietary issues and exercise activities discussed: Current Exercise Habits: The patient does not participate in regular exercise at present, Exercise limited by: orthopedic condition(s);neurologic condition(s);psychological condition(s);cardiac condition(s)  Goals    .  Patient Stated      I want to lose weight by increasing my physical activity by doing chair exercises.     .  Patient Stated (pt-stated)      My goal is to lose 60 pounds.    .  Pharmacy Care Plan      CARE PLAN ENTRY  Current Barriers:  . Chronic Disease Management support, education, and care coordination needs related to Hypertension, Hyperlipidemia, Diabetes, and Atrial Fibrillation   Hypertension/Atrial fibrillation . Pharmacist Clinical Goal(s): o Over the next 90 days, patient will work with PharmD and providers to maintain BP goal <130/80 . Current regimen:  o torsemide 20 mg x 3 daily,  o flecainide 50 mg every 12 hours  o Eliquis 5 mg twice a day o potassium 40 mEq at bedtime (20 meq x 2) . Interventions: o Discussed BP goal and benefits of maintaining BP in goal range, including stroke prevention o Pursue patient assistance for Eliquis . Patient self care activities - Over the next 90 days, patient will: o Check BP once weekly, document, and provide at future appointments o Ensure daily salt intake < 2300 mg/day o Complete patient  section of Eliquis application and mail to manufacturer  Hyperlipidemia/history of stroke . Pharmacist Clinical Goal(s): o Over the next 90 days, patient will work with PharmD and providers to achieve LDL goal < 70 . Current regimen:  o Atorvastatin 80 mg daily . Interventions: o Discussed benefits of statin for cholesterol lowering and prevention of stroke . Patient self care activities - Over the next 90 days, patient will: o Continue medication as prescribed o Continue low cholesterol diet  Diabetes Hemoglobin A1C  Date Value Ref Range Status  11/14/2018 6.5 (A) 4.0 - 5.6 % Final .  Pharmacist Clinical Goal(s): o Over the next 90 days, patient will work with PharmD and providers to maintain A1c goal <6.5% . Current regimen:  o No medications indicated . Interventions: o Discussed benefits of weight loss for prevention of diabetes complications . Patient self care activities - Over the next 90 days, patient will: o Reduce carbohydrates in diet o Increase exercise for weight loss  Medication management . Pharmacist Clinical Goal(s): o Over the next 90 days, patient will work with PharmD and providers to maintain optimal medication adherence . Current pharmacy: [facility] . Interventions o Comprehensive medication review performed. o Continue current medication management strategy . Patient self care activities - Over the next 90 days, patient will: o Focus on medication adherence by pill box o Take medications as prescribed o Report any questions or concerns to PharmD and/or provider(s)  Initial goal documentation       Depression Screen PHQ 2/9 Scores 10/27/2019 10/27/2019 11/14/2018 05/16/2018 10/01/2017 07/28/2016 02/16/2015  PHQ - 2 Score 0 0  2 2 0 0 0  PHQ- 9 Score - - 8 6 - 2 -    Fall Risk Fall Risk  10/27/2019 07/29/2019 01/24/2019 10/24/2018 09/27/2018  Falls in the past year? 1 1 1  0 0  Comment - - - - -  Number falls in past yr: 1 1 1  - -  Injury with Fall? 1 - 0 -  -  Comment - - - - -  Risk Factor Category  - - - - -  Risk for fall due to : Impaired balance/gait;Impaired mobility;Orthopedic patient - - - -  Follow up Falls evaluation completed;Education provided - - - -    Any stairs in or around the home? No  If so, are there any without handrails? No  Home free of loose throw rugs in walkways, pet beds, electrical cords, etc? Yes  Adequate lighting in your home to reduce risk of falls? Yes   ASSISTIVE DEVICES UTILIZED TO PREVENT FALLS:  Life alert? No  Use of a cane, walker or w/c? Yes  Grab bars in the bathroom? No  Shower chair or bench in shower? No  Elevated toilet seat or a handicapped toilet? No   TIMED UP AND GO:  Was the test performed? No .  Length of time to ambulate 10 feet: 0 sec.   Gait slow and steady with assistive device  Cognitive Function:     6CIT Screen 10/27/2019  What Year? 0 points  What month? 0 points  What time? 0 points  Count back from 20 0 points  Months in reverse 2 points  Repeat phrase 2 points  Total Score 4    Immunizations Immunization History  Administered Date(s) Administered  . Fluad Quad(high Dose 65+) 12/11/2018  . Influenza, High Dose Seasonal PF 01/20/2016, 01/31/2017, 03/06/2018  . Influenza,inj,Quad PF,6+ Mos 04/09/2013  . Influenza-Unspecified 03/08/2018  . PFIZER SARS-COV-2 Vaccination 05/10/2019, 06/04/2019  . Pneumococcal Conjugate-13 10/20/2015  . Pneumococcal Polysaccharide-23 04/09/2013, 10/27/2019  . Td 11/02/2009  . Tdap 01/20/2016    TDAP status: Up to date Flu Vaccine status: Up to date Pneumococcal vaccine status: Up to date Covid-19 vaccine status: Completed vaccines  Qualifies for Shingles Vaccine? Yes   Zostavax completed No   Shingrix Completed?: No.    Education has been provided regarding the importance of this vaccine. Patient has been advised to call insurance company to determine out of pocket expense if they have not yet received this vaccine.  Advised may also receive vaccine at local pharmacy or Health Dept. Verbalized acceptance and understanding.  Screening Tests Health Maintenance  Topic Date Due  . URINE MICROALBUMIN  12/12/2007  . HEMOGLOBIN A1C  05/17/2019  . INFLUENZA VACCINE  11/02/2019  . OPHTHALMOLOGY EXAM  05/13/2020  . FOOT EXAM  10/26/2020  . COLONOSCOPY  08/01/2023  . TETANUS/TDAP  01/19/2026  . COVID-19 Vaccine  Completed  . Hepatitis C Screening  Completed  . PNA vac Low Risk Adult  Completed    Health Maintenance  Health Maintenance Due  Topic Date Due  . URINE MICROALBUMIN  12/12/2007  . HEMOGLOBIN A1C  05/17/2019    Colorectal cancer screening: Completed 07/31/2013. Repeat every 10 years  Lung Cancer Screening: (Low Dose CT Chest recommended if Age 37-80 years, 30 pack-year currently smoking OR have quit w/in 15years.) does not qualify.   Lung Cancer Screening Referral: no  Additional Screening:  Hepatitis C Screening: does qualify; Completed yes  Vision Screening: Recommended annual ophthalmology exams for early detection of glaucoma  and other disorders of the eye. Is the patient up to date with their annual eye exam?  Yes  Who is the provider or what is the name of the office in which the patient attends annual eye exams? [name] Noon, OD If pt is not established with a provider, would they like to be referred to a provider to establish care? No .   Dental Screening: Recommended annual dental exams for proper oral hygiene  Community Resource Referral / Chronic Care Management: CRR required this visit?  Yes   CCM required this visit?  Yes      Plan:     I have personally reviewed and noted the following in the patient's chart:   . Medical and social history . Use of alcohol, tobacco or illicit drugs  . Current medications and supplements . Functional ability and status . Nutritional status . Physical activity . Advanced directives . List of other  physicians . Hospitalizations, surgeries, and ER visits in previous 12 months . Vitals . Screenings to include cognitive, depression, and falls . Referrals and appointments  In addition, I have reviewed and discussed with patient certain preventive protocols, quality metrics, and best practice recommendations. A written personalized care plan for preventive services as well as general preventive health recommendations were provided to patient.     Sheral Flow, LPN   04/05/1115   Nurse Notes:  Patient is in need of several [place] referrals: DME equipment, Exercise Physiologist for Individual Counseling, etc.

## 2019-10-27 NOTE — Patient Instructions (Signed)
[name] , Thank you for taking time to come for your Medicare Wellness Visit. I appreciate your ongoing commitment to your health goals. Please review the following plan we discussed and let me know if I can assist you in the future.   Screening recommendations/referrals: Colonoscopy: last done 07/31/2013; due every 10 years Recommended yearly ophthalmology/optometry visit for glaucoma screening and checkup Recommended yearly dental visit for hygiene and checkup  Vaccinations: Influenza vaccine: 12/11/2018 Pneumococcal vaccine: completed Tdap vaccine: 01/20/2016; due every 10 years Shingles vaccine: never done  Covid-19: completed  Advanced directives: Advance directive discussed with you today. I have provided a copy for you to complete at home and have notarized. Once this is complete please bring a copy in to our office so we can scan it into your chart.  Conditions/risks identified: Yes; continue to work at your goals.  Increase your intake of water and monitor your sweets, sodas and carbohydrate intake.  Next appointment: In one year with Health Coach.  Preventive Care 45 Years and Older, Male Preventive care refers to lifestyle choices and visits with your health care provider that can promote health and wellness. What does preventive care include?  A yearly physical exam. This is also called an annual well check.  Dental exams once or twice a year.  Routine eye exams. Ask your health care provider how often you should have your eyes checked.  Personal lifestyle choices, including:  Daily care of your teeth and gums.  Regular physical activity.  Eating a healthy diet.  Avoiding tobacco and drug use.  Limiting alcohol use.  Practicing safe sex.  Taking low doses of aspirin every day.  Taking vitamin and mineral supplements as recommended by your health care provider. What happens during an annual well check? The services and screenings done by your health care  provider during your annual well check will depend on your age, overall health, lifestyle risk factors, and family history of disease. Counseling  Your health care provider may ask you questions about your:  Alcohol use.  Tobacco use.  Drug use.  Emotional well-being.  Home and relationship well-being.  Sexual activity.  Eating habits.  History of falls.  Memory and ability to understand (cognition).  Work and work Statistician. Screening  You may have the following tests or measurements:  Height, weight, and BMI.  Blood pressure.  Lipid and cholesterol levels. These may be checked every 5 years, or more frequently if you are over 52 years old.  Skin check.  Lung cancer screening. You may have this screening every year starting at age 58 if you have a 30-pack-year history of smoking and currently smoke or have quit within the past 15 years.  Fecal occult blood test (FOBT) of the stool. You may have this test every year starting at age 31.  Flexible sigmoidoscopy or colonoscopy. You may have a sigmoidoscopy every 5 years or a colonoscopy every 10 years starting at age 41.  Prostate cancer screening. Recommendations will vary depending on your family history and other risks.  Hepatitis C blood test.  Hepatitis B blood test.  Sexually transmitted disease (STD) testing.  Diabetes screening. This is done by checking your blood sugar (glucose) after you have not eaten for a while (fasting). You may have this done every 1-3 years.  Abdominal aortic aneurysm (AAA) screening. You may need this if you are a current or former smoker.  Osteoporosis. You may be screened starting at age 49 if you are at high risk.  Talk with your health care provider about your test results, treatment options, and if necessary, the need for more tests. Vaccines  Your health care provider may recommend certain vaccines, such as:  Influenza vaccine. This is recommended every year.  Tetanus,  diphtheria, and acellular pertussis (Tdap, Td) vaccine. You may need a Td booster every 10 years.  Zoster vaccine. You may need this after age 66.  Pneumococcal 13-valent conjugate (PCV13) vaccine. One dose is recommended after age 35.  Pneumococcal polysaccharide (PPSV23) vaccine. One dose is recommended after age 7. Talk to your health care provider about which screenings and vaccines you need and how often you need them. This information is not intended to replace advice given to you by your health care provider. Make sure you discuss any questions you have with your health care provider. Document Released: 04/16/2015 Document Revised: 12/08/2015 Document Reviewed: 01/19/2015 Elsevier Interactive Patient Education  2017 [place] Prevention in the Home Falls can cause injuries. They can happen to people of all ages. There are many things you can do to make your home safe and to help prevent falls. What can I do on the outside of my home?  Regularly fix the edges of walkways and driveways and fix any cracks.  Remove anything that might make you trip as you walk through a door, such as a raised step or threshold.  Trim any bushes or trees on the path to your home.  Use bright outdoor lighting.  Clear any walking paths of anything that might make someone trip, such as rocks or tools.  Regularly check to see if handrails are loose or broken. Make sure that both sides of any steps have handrails.  Any raised decks and porches should have guardrails on the edges.  Have any leaves, snow, or ice cleared regularly.  Use sand or salt on walking paths during winter.  Clean up any spills in your garage right away. This includes oil or grease spills. What can I do in the bathroom?  Use night lights.  Install grab bars by the toilet and in the tub and shower. Do not use towel bars as grab bars.  Use non-skid mats or decals in the tub or shower.  If you need to sit down in  the shower, use a plastic, non-slip stool.  Keep the floor dry. Clean up any water that spills on the floor as soon as it happens.  Remove soap buildup in the tub or shower regularly.  Attach bath mats securely with double-sided non-slip rug tape.  Do not have throw rugs and other things on the floor that can make you trip. What can I do in the bedroom?  Use night lights.  Make sure that you have a light by your bed that is easy to reach.  Do not use any sheets or blankets that are too big for your bed. They should not hang down onto the floor.  Have a firm chair that has side arms. You can use this for support while you get dressed.  Do not have throw rugs and other things on the floor that can make you trip. What can I do in the kitchen?  Clean up any spills right away.  Avoid walking on wet floors.  Keep items that you use a lot in easy-to-reach places.  If you need to reach something above you, use a strong step stool that has a grab bar.  Keep electrical cords out of the way.  Do not use floor polish or wax that makes floors slippery. If you must use wax, use non-skid floor wax.  Do not have throw rugs and other things on the floor that can make you trip. What can I do with my stairs?  Do not leave any items on the stairs.  Make sure that there are handrails on both sides of the stairs and use them. Fix handrails that are broken or loose. Make sure that handrails are as long as the stairways.  Check any carpeting to make sure that it is firmly attached to the stairs. Fix any carpet that is loose or worn.  Avoid having throw rugs at the top or bottom of the stairs. If you do have throw rugs, attach them to the floor with carpet tape.  Make sure that you have a light switch at the top of the stairs and the bottom of the stairs. If you do not have them, ask someone to add them for you. What else can I do to help prevent falls?  Wear shoes that:  Do not have high  heels.  Have rubber bottoms.  Are comfortable and fit you well.  Are closed at the toe. Do not wear sandals.  If you use a stepladder:  Make sure that it is fully opened. Do not climb a closed stepladder.  Make sure that both sides of the stepladder are locked into place.  Ask someone to hold it for you, if possible.  Clearly mark and make sure that you can see:  Any grab bars or handrails.  First and last steps.  Where the edge of each step is.  Use tools that help you move around (mobility aids) if they are needed. These include:  Canes.  Walkers.  Scooters.  Crutches.  Turn on the lights when you go into a dark area. Replace any light bulbs as soon as they burn out.  Set up your furniture so you have a clear path. Avoid moving your furniture around.  If any of your floors are uneven, fix them.  If there are any pets around you, be aware of where they are.  Review your medicines with your doctor. Some medicines can make you feel dizzy. This can increase your chance of falling. Ask your doctor what other things that you can do to help prevent falls. This information is not intended to replace advice given to you by your health care provider. Make sure you discuss any questions you have with your health care provider. Document Released: 01/14/2009 Document Revised: 08/26/2015 Document Reviewed: 04/24/2014 Elsevier Interactive Patient Education  2017 [employer].

## 2019-10-28 ENCOUNTER — Other Ambulatory Visit: Payer: Self-pay | Admitting: Physical Medicine & Rehabilitation

## 2019-10-28 LAB — CBC WITH DIFFERENTIAL/PLATELET
Absolute Monocytes: 1035 cells/uL — ABNORMAL HIGH (ref 200–950)
Basophils Absolute: 81 cells/uL (ref 0–200)
Basophils Relative: 0.7 %
Eosinophils Absolute: 92 cells/uL (ref 15–500)
Eosinophils Relative: 0.8 %
HCT: 41.5 % (ref 38.5–50.0)
Hemoglobin: 13.4 g/dL (ref 13.2–17.1)
Lymphs Abs: 1553 cells/uL (ref 850–3900)
MCH: 28.4 pg (ref 27.0–33.0)
MCHC: 32.3 g/dL (ref 32.0–36.0)
MCV: 87.9 fL (ref 80.0–100.0)
MPV: 11.3 fL (ref 7.5–12.5)
Monocytes Relative: 9 %
Neutro Abs: 8740 cells/uL — ABNORMAL HIGH (ref 1500–7800)
Neutrophils Relative %: 76 %
Platelets: 242 10*3/uL (ref 140–400)
RBC: 4.72 10*6/uL (ref 4.20–5.80)
RDW: 13.6 % (ref 11.0–15.0)
Total Lymphocyte: 13.5 %
WBC: 11.5 10*3/uL — ABNORMAL HIGH (ref 3.8–10.8)

## 2019-10-28 LAB — URINALYSIS, ROUTINE W REFLEX MICROSCOPIC
Bacteria, UA: NONE SEEN /HPF
Bilirubin Urine: NEGATIVE
Glucose, UA: NEGATIVE
Hgb urine dipstick: NEGATIVE
Hyaline Cast: NONE SEEN /LPF
Ketones, ur: NEGATIVE
Leukocytes,Ua: NEGATIVE
Nitrite: NEGATIVE
Specific Gravity, Urine: 1.02 (ref 1.001–1.03)
Squamous Epithelial / HPF: NONE SEEN /HPF (ref <=5)
WBC, UA: NONE SEEN /HPF (ref 0–5)
pH: 5.5 (ref 5.0–8.0)

## 2019-10-28 LAB — BASIC METABOLIC PANEL WITH GFR
BUN: 24 mg/dL (ref 7–25)
CO2: 30 mmol/L (ref 20–32)
Calcium: 8.9 mg/dL (ref 8.6–10.3)
Chloride: 103 mmol/L (ref 98–110)
Creat: 1.18 mg/dL (ref 0.70–1.18)
GFR, Est African American: 71 mL/min/{1.73_m2} (ref >=60)
GFR, Est Non African American: 61 mL/min/{1.73_m2} (ref >=60)
Glucose, Bld: 108 mg/dL — ABNORMAL HIGH (ref 65–99)
Potassium: 4.1 mmol/L (ref 3.5–5.3)
Sodium: 143 mmol/L (ref 135–146)

## 2019-10-28 LAB — FOLATE: Folate: 18.4 ng/mL

## 2019-10-28 LAB — LIPID PANEL
Cholesterol: 132 mg/dL (ref <200)
HDL: 29 mg/dL — ABNORMAL LOW (ref >=40)
LDL Cholesterol (Calc): 74 mg/dL (calc)
Non-HDL Cholesterol (Calc): 103 mg/dL (calc) (ref <130)
Total CHOL/HDL Ratio: 4.6 (calc) (ref <5.0)
Triglycerides: 191 mg/dL — ABNORMAL HIGH (ref <150)

## 2019-10-28 LAB — HEPATIC FUNCTION PANEL
AG Ratio: 1.4 (calc) (ref 1.0–2.5)
ALT: 18 U/L (ref 9–46)
AST: 21 U/L (ref 10–35)
Albumin: 3.6 g/dL (ref 3.6–5.1)
Alkaline phosphatase (APISO): 113 U/L (ref 35–144)
Bilirubin, Direct: 0.2 mg/dL (ref 0.0–0.2)
Globulin: 2.5 g/dL (calc) (ref 1.9–3.7)
Indirect Bilirubin: 0.7 mg/dL (calc) (ref 0.2–1.2)
Total Bilirubin: 0.9 mg/dL (ref 0.2–1.2)
Total Protein: 6.1 g/dL (ref 6.1–8.1)

## 2019-10-28 LAB — MICROALBUMIN / CREATININE URINE RATIO
Creatinine, Urine: 153 mg/dL (ref 20–320)
Microalb Creat Ratio: 46 mcg/mg creat — ABNORMAL HIGH (ref <30)
Microalb, Ur: 7.1 mg/dL

## 2019-10-28 LAB — HEMOGLOBIN A1C
Hgb A1c MFr Bld: 6.4 % of total Hgb — ABNORMAL HIGH (ref <5.7)
Mean Plasma Glucose: 137 (calc)
eAG (mmol/L): 7.6 (calc)

## 2019-10-28 LAB — VITAMIN B12: Vitamin B-12: 1134 pg/mL — ABNORMAL HIGH (ref 200–1100)

## 2019-10-28 LAB — TSH: TSH: 4.33 mIU/L (ref 0.40–4.50)

## 2019-11-04 ENCOUNTER — Other Ambulatory Visit: Payer: Self-pay

## 2019-11-04 ENCOUNTER — Ambulatory Visit (INDEPENDENT_AMBULATORY_CARE_PROVIDER_SITE_OTHER): Payer: Medicare Other | Admitting: Psychiatry

## 2019-11-04 DIAGNOSIS — F330 Major depressive disorder, recurrent, mild: Secondary | ICD-10-CM

## 2019-11-04 DIAGNOSIS — F419 Anxiety disorder, unspecified: Secondary | ICD-10-CM | POA: Diagnosis not present

## 2019-11-04 DIAGNOSIS — G464 Cerebellar stroke syndrome: Secondary | ICD-10-CM | POA: Diagnosis not present

## 2019-11-04 MED ORDER — SERTRALINE HCL 100 MG PO TABS: 150.0000 mg | ORAL_TABLET | Freq: Every day | ORAL | 1 refills | Status: DC

## 2019-11-04 MED ORDER — LORAZEPAM 0.5 MG PO TABS: 0.5000 mg | ORAL_TABLET | Freq: Every day | ORAL | 1 refills | Status: DC

## 2019-11-04 NOTE — Progress Notes (Signed)
Virtual Visit via Telephone Note  I connected with [name] on 11/04/19 at  2:00 PM EDT by telephone and verified that I am speaking with the correct person using two identifiers.  Location: Patient: home Provider: home office   I discussed the limitations, risks, security and privacy concerns of performing an evaluation and management service by telephone and the availability of in person appointments. I also discussed with the patient that there may be a patient responsible charge related to this service. The patient expressed understanding and agreed to proceed.   History of Present Illness: Is evaluated by phone session.  Is compliant with medication and reported no side effects.  He is using CPAP which is helping his sleep but sometimes he goes to bed very late.  He enjoys the company of his granddaughter who is now 74 years old.  He lives with his wife who is supportive.  Patient realized that he is getting old but he had a good support from his wife and son and daughter-in-law.  He denies any feeling of hopelessness or worthlessness.  He feels his depression is a stable and his energy level is fair.  He like to keep his current medication.   Past Psychiatric History:Reviewed [facility] [place] and having suicidal thoughts. TookRisperdal but d/c after stroke in March 2018.   Psychiatric Specialty Exam: Physical Exam  Review of Systems  There were no vitals taken for this visit.There is no height or weight on file to calculate BMI.  General Appearance: NA  Eye Contact:  NA  Speech:  Slow  Volume:  Decreased  Mood:  Euthymic  Affect:  NA  Thought Process:  Descriptions of Associations: Intact  Orientation:  Full (Time, Place, and Person)  Thought Content:  Logical  Suicidal Thoughts:  No  Homicidal Thoughts:  No  Memory:  Immediate;   Good Recent;   Fair Remote;   Good  Judgement:  Fair  Insight:  Present  Psychomotor Activity:  NA   Concentration:  Concentration: Fair and Attention Span: Fair  Recall:  [employer] of Knowledge:  Good  Language:  Good  Akathisia:  No  Handed:  Right  AIMS (if indicated):     Assets:  Communication Skills Desire for Improvement Housing Resilience Social Support  ADL's:  Intact  Cognition:  WNL  Sleep:   fair      Assessment and Plan: Major depressive disorder, recurrent.  Anxiety.  Patient is stable on his current medication.  He has no side effects.  We will continue Zoloft from 50 mg daily and lorazepam 0.5 mg at bedtime.  Recommended to call us back if is any question or any concern.  Follow-up in 6 months.  Follow Up Instructions:    I discussed the assessment and treatment plan with the patient. The patient was provided an opportunity to ask questions and all were answered. The patient agreed with the plan and demonstrated an understanding of the instructions.   The patient was advised to call back or seek an in-person evaluation if the symptoms worsen or if the condition fails to improve as anticipated.  I provided 16 minutes of non-face-to-face time during this encounter.   Kathlee Nations, MD

## 2019-11-19 ENCOUNTER — Other Ambulatory Visit: Payer: Self-pay

## 2019-11-19 ENCOUNTER — Ambulatory Visit: Payer: Medicare Other | Admitting: Pharmacist

## 2019-11-19 DIAGNOSIS — E118 Type 2 diabetes mellitus with unspecified complications: Secondary | ICD-10-CM

## 2019-11-19 DIAGNOSIS — E785 Hyperlipidemia, unspecified: Secondary | ICD-10-CM

## 2019-11-19 DIAGNOSIS — I480 Paroxysmal atrial fibrillation: Secondary | ICD-10-CM

## 2019-11-19 DIAGNOSIS — I10 Essential (primary) hypertension: Secondary | ICD-10-CM

## 2019-11-19 DIAGNOSIS — I5032 Chronic diastolic (congestive) heart failure: Secondary | ICD-10-CM

## 2019-11-19 NOTE — Patient Instructions (Addendum)
Visit Information  Phone number for Pharmacist: [phone]  Goals Addressed            This Visit's Progress   . Pharmacy Care Plan       CARE PLAN ENTRY  Current Barriers:  . Chronic Disease Management support, education, and care coordination needs related to Hypertension, Hyperlipidemia, Diabetes, Atrial Fibrillation, and Heart Failure   Hypertension/Heart Failure BP Readings from Last 3 Encounters:  10/27/19 (!) 140/64  10/27/19 (!) 140/64  07/29/19 (!) 147/67 .  Pharmacist Clinical Goal(s): o Over the next 90 days, patient will work with PharmD and providers to maintain BP goal <130/80 . Current regimen:  o torsemide 20 mg x 3 daily,  o potassium 40 mEq at bedtime (20 meq x 2) . Interventions: o Discussed BP goal and benefits of maintaining BP in goal range o Discussed use of torsemide most days to help with swelling . Patient self care activities - Over the next 90 days, patient will: o Check BP once weekly, document, and provide at future appointments o Ensure daily salt intake < 2300 mg/day  Hyperlipidemia/history of stroke Lab Results  Component Value Date/Time   LDLCALC 74 10/27/2019 02:00 PM   LDLDIRECT 91.0 05/16/2018 04:16 PM .  Pharmacist Clinical Goal(s): o Over the next 90 days, patient will work with PharmD and providers to achieve LDL goal < 70 . Current regimen:  o Atorvastatin 80 mg daily . Interventions: o Discussed benefits of statin for cholesterol lowering and prevention of stroke . Patient self care activities - Over the next 90 days, patient will: o Continue medication as prescribed o Continue low cholesterol diet  Diabetes Hemoglobin A1C  Date Value Ref Range Status  11/14/2018 6.5 (A) 4.0 - 5.6 % Final .  Pharmacist Clinical Goal(s): o Over the next 90 days, patient will work with PharmD and providers to maintain A1c goal <6.5% . Current regimen:  o No medications indicated . Interventions: o Discussed benefits of weight loss for  prevention of diabetes complications . Patient self care activities - Over the next 90 days, patient will: o Reduce carbohydrates in diet o Increase exercise for weight loss  Atrial fibrillation . Pharmacist Clinical Goal(s) o Over the next 90 days, patient will work with PharmD and providers to optimize therapy . Current regimen:  o Flecainide 50 mg every 12 hours  o Eliquis 5 mg twice a day . Interventions: o Pursue patient assistance for Eliquis . Patient self care activities - Over the next 90 days, patient will: o Complete patient section of Eliquis application return to office to fax  Medication management . Pharmacist Clinical Goal(s): o Over the next 90 days, patient will work with PharmD and providers to maintain optimal medication adherence . Current pharmacy: [facility] . Interventions o Comprehensive medication review performed. o Continue current medication management strategy . Patient self care activities - Over the next 90 days, patient will: o Focus on medication adherence by pill box o Take medications as prescribed o Report any questions or concerns to PharmD and/or provider(s)  Please see past updates related to this goal by clicking on the "Past Updates" button in the selected goal        Patient verbalizes understanding of instructions provided today.   Telephone follow up appointment with pharmacy team member scheduled for: 3 months  Charlene Brooke, PharmD Clinical Pharmacist [facility] [phone]  Sit-to-Stand Exercise  The sit-to-stand exercise (also known as the chair stand or chair rise  exercise) strengthens your lower body and helps you maintain or improve your mobility and independence. The goal is to do the sit-to-stand exercise without using your hands. This will be easier as you become stronger. You should always talk with your health care provider before starting any exercise program, especially if you have had  recent surgery. Do the exercise exactly as told by your health care provider and adjust it as directed. It is normal to feel mild stretching, pulling, tightness, or discomfort as you do this exercise, but you should stop right away if you feel sudden pain or your pain gets worse. Do not begin doing this exercise until told by your health care provider. What the sit-to-stand exercise does The sit-to-stand exercise helps to strengthen the muscles in your thighs and the muscles in the center of your body that give you stability (core muscles). This exercise is especially helpful if:  You have had knee or hip surgery.  You have trouble getting up from a chair, out of a car, or off the toilet. How to do the sit-to-stand exercise 1. Sit toward the front edge of a sturdy chair without armrests. Your knees should be bent and your feet should be flat on the floor and shoulder-width apart. 2. Place your hands lightly on each side of the seat. Keep your back and neck as straight as possible, with your chest slightly forward. 3. Breathe in slowly. Lean forward and slightly shift your weight to the front of your feet. 4. Breathe out as you slowly stand up. Use your hands as little as possible. 5. Stand and pause for a full breath in and out. 6. Breathe in as you sit down slowly. Tighten your core and abdominal muscles to control your lowering as much as possible. 7. Breathe out slowly. 8. Do this exercise 10-15 times. If needed, do it fewer times until you build up strength. 9. Rest for 1 minute, then do another set of 10-15 repetitions. To change the difficulty of the sit-to-stand exercise  If the exercise is too difficult, use a chair with sturdy armrests, and push off the armrests to help you come to the standing position. You can also use the armrests to help slowly lower yourself back to sitting. As this gets easier, try to use your arms less. You can also place a firm cushion or pillow on the chair to  make the surface higher.  If this exercise is too easy, do not use your arms to help raise or lower yourself. You can also wear a weighted vest, use hand weights, increase your repetitions, or try a lower chair. General tips  You may feel tired when starting an exercise routine. This is normal.  You may have muscle soreness that lasts a few days. This is normal. As you get stronger, you may not feel muscle soreness.  Use smooth, steady movements.  Do not  hold your breath during strength exercises. This can cause unsafe changes in your blood pressure.  Breathe in slowly through your nose, and breathe out slowly through your mouth. Summary  Strengthening your lower body is an important step to help you move safely and independently.  The sit-to-stand exercise helps strengthen the muscles in your thighs and core.  You should always talk with your health care provider before starting any exercise program, especially if you have had recent surgery. This information is not intended to replace advice given to you by your health care provider. Make sure you discuss any  questions you have with your health care provider. Document Revised: 01/16/2018 Document Reviewed: 05/11/2016 Elsevier Patient Education  [place].

## 2019-11-19 NOTE — Chronic Care Management (AMB) (Signed)
Chronic Care Management Pharmacy  Name: [name]  MRN: [id number] DOB: May 28, 1945   Chief Complaint/ HPI  [name],  74 y.o. , male presents for their Follow-Up CCM visit with the clinical pharmacist via telephone due to COVID-19 Pandemic.  PCP : Janith Lima, MD  Their chronic conditions include: Hypertension, Hyperlipidemia, Diabetes, Atrial Fibrillation, Heart Failure, GERD, Depression, Anxiety and embolic CVA, recurrent PE  Pt reports he had a stroke in 2018, ever since balance is off. Walks with cane/walker - LLE pain and problems. Lower back pain (Dr Ella Bodo - lumbar injections) He also had Bariatric band surgery Jan 2015. Lost 100 lbs in 2014-2015, had many health issues later in 2015.   Office Visits: 10/17/19 Dr Ronnald Ramp OV: conditions stable, no med changes  02/07/19 Dr Jenny Reichmann: acute calf pain x 3 days, r/o DVT, likely MSK. Rx'd Norco prn.  11/14/18 Dr Ronnald Ramp OV: acute foot pain, likely plantar fasciitis, referred to podiatry. A1c up to 6.5, new onset DM, no meds indicated yet.  Consult Visit: 11/04/19 Dr Adele Schilder (psych): f/u for depression, currently stable, no med changes.   08/28/19 Dr Letta Pate (phys med/rehab): 3-4/10 pain. Pt has recent fall, no significant injury. No longer taking hydrocodone. No med changes.  06/03/19 Dr Debara Pickett (cardiology): persistent DOE, 2/2 morbid obesity and diastolic dysfunction. Afib controlled. On CPAP for OSA. 05/29/19 [facility] E[facility]: MBS normal, voice and swallowing stable. 05/12/19 Dr Adele Schilder (psych): pt stable, no med changes. 05/04/19 sleep study: OSA uncontrolled w/ current CPAP. Changed to BiPAP.  04/17/19 Dr Annamaria Boots (pulmonary): CPAP not doing enough, scheduled BiPAP sleep study. 10/2018: ED visit for back pain.   Allergies  Allergen Reactions  . Morphine Nausea And Vomiting and Other (See Comments)    Severe hallucinations  . Codeine Nausea Only  . Penicillins Other (See Comments)    Passed out after injection Did it involve  swelling of the face/tongue/throat, SOB, or low BP? Unknown Did it involve sudden or severe rash/hives, skin peeling, or any reaction on the inside of your mouth or nose? No Did you need to seek medical attention at a hospital or doctor's office? Yes When did it last happen?teenager If all above answers are "NO", may proceed with cephalosporin use.  [name] Propoxyphene N-Acetaminophen Nausea Only    Medications: Outpatient Encounter Medications as of 11/19/2019  Medication Sig Note  . apixaban (ELIQUIS) 5 MG TABS tablet Take 1 tablet (5 mg total) by mouth 2 (two) times daily.   [name] atorvastatin (LIPITOR) 80 MG tablet Take 1 tablet by mouth once daily   . Docusate Sodium (DSS) 100 MG CAPS Take 100 mg by mouth every other day.   . finasteride (PROSCAR) 5 MG tablet Take 1 tablet (5 mg total) by mouth daily.   . flecainide (TAMBOCOR) 50 MG tablet Take 1 tablet (50 mg total) by mouth every 12 (twelve) hours. OV NEEED   . HYDROcodone-acetaminophen (NORCO/VICODIN) 5-325 MG tablet Take 1 tablet by mouth every 6 (six) hours as needed. 07/29/2019: #20 on 07/25/2019 #12 today LD 07/29/2019  . lidocaine (LIDODERM) 5 % Place 1 patch onto the skin daily. Remove & Discard patch within 12 hours or as directed by MD   . loratadine (CLARITIN) 10 MG tablet Take 10 mg by mouth daily.   [name] LORazepam (ATIVAN) 0.5 MG tablet Take 1 tablet (0.5 mg total) by mouth at bedtime.   . montelukast (SINGULAIR) 10 MG tablet TAKE 1 TABLET BY MOUTH AT BEDTIME   . OVER THE  COUNTER MEDICATION Take 1 tablet by mouth daily. Multi vitamin with iron   . pantoprazole (PROTONIX) 40 MG tablet Take 1 tablet by mouth once daily   . potassium chloride SA (KLOR-CON) 20 MEQ tablet TAKE 1 TABLET BY MOUTH TWICE DAILY **AFTERNOON  AND  BEDTIME**   . rOPINIRole (REQUIP) 0.5 MG tablet TAKE 1 TABLET BY MOUTH THREE TIMES DAILY   . sertraline (ZOLOFT) 100 MG tablet Take 1.5 tablets (150 mg total) by mouth at bedtime.   . torsemide (DEMADEX) 20 MG tablet  TAKE 3 TABLETS BY MOUTH ONCE DAILY. KEEP APPOINTMENT AS SCHEDULED   . traMADol (ULTRAM) 50 MG tablet TAKE 1 TABLET BY MOUTH EVERY 8 HOURS AS NEEDED   . [DISCONTINUED] atorvastatin (LIPITOR) 80 MG tablet Take 1 tablet (80 mg total) by mouth daily.   . [DISCONTINUED] montelukast (SINGULAIR) 10 MG tablet Take 1 tablet (10 mg total) by mouth at bedtime.   . [DISCONTINUED] pantoprazole (PROTONIX) 40 MG tablet Take 1 tablet by mouth once daily    No facility-administered encounter medications on file as of 11/19/2019.     Current Diagnosis/Assessment:    Goals Addressed            This Visit's Progress   . Pharmacy Care Plan       CARE PLAN ENTRY  Current Barriers:  . Chronic Disease Management support, education, and care coordination needs related to Hypertension, Hyperlipidemia, Diabetes, Atrial Fibrillation, and Heart Failure   Hypertension/Heart Failure BP Readings from Last 3 Encounters:  10/27/19 (!) 140/64  10/27/19 (!) 140/64  07/29/19 (!) 147/67 .  Pharmacist Clinical Goal(s): o Over the next 90 days, patient will work with PharmD and providers to maintain BP goal <130/80 . Current regimen:  o torsemide 20 mg x 3 daily,  o potassium 40 mEq at bedtime (20 meq x 2) . Interventions: o Discussed BP goal and benefits of maintaining BP in goal range o Discussed use of torsemide most days to help with swelling . Patient self care activities - Over the next 90 days, patient will: o Check BP once weekly, document, and provide at future appointments o Ensure daily salt intake < 2300 mg/day  Hyperlipidemia/history of stroke Lab Results  Component Value Date/Time   LDLCALC 74 10/27/2019 02:00 PM   LDLDIRECT 91.0 05/16/2018 04:16 PM .  Pharmacist Clinical Goal(s): o Over the next 90 days, patient will work with PharmD and providers to achieve LDL goal < 70 . Current regimen:  o Atorvastatin 80 mg daily . Interventions: o Discussed benefits of statin for cholesterol lowering  and prevention of stroke . Patient self care activities - Over the next 90 days, patient will: o Continue medication as prescribed o Continue low cholesterol diet  Diabetes Hemoglobin A1C  Date Value Ref Range Status  11/14/2018 6.5 (A) 4.0 - 5.6 % Final .  Pharmacist Clinical Goal(s): o Over the next 90 days, patient will work with PharmD and providers to maintain A1c goal <6.5% . Current regimen:  o No medications indicated . Interventions: o Discussed benefits of weight loss for prevention of diabetes complications . Patient self care activities - Over the next 90 days, patient will: o Reduce carbohydrates in diet o Increase exercise for weight loss  Atrial fibrillation . Pharmacist Clinical Goal(s) o Over the next 90 days, patient will work with PharmD and providers to optimize therapy . Current regimen:  o Flecainide 50 mg every 12 hours  o Eliquis 5 mg twice a day . Interventions:  o Pursue patient assistance for Eliquis . Patient self care activities - Over the next 90 days, patient will: o Complete patient section of Eliquis application return to office to fax  Medication management . Pharmacist Clinical Goal(s): o Over the next 90 days, patient will work with PharmD and providers to maintain optimal medication adherence . Current pharmacy: [facility] . Interventions o Comprehensive medication review performed. o Continue current medication management strategy . Patient self care activities - Over the next 90 days, patient will: o Focus on medication adherence by pill box o Take medications as prescribed o Report any questions or concerns to PharmD and/or provider(s)  Please see past updates related to this goal by clicking on the "Past Updates" button in the selected goal        AFIB   Hx GI bleed 07/2013.  Patient is currently rhythm controlled. Pulse Readings from Last 3 Encounters:  10/27/19 72  10/27/19 72  07/29/19 76   CHA2DS2-VASc Score = 5  The  patient's score is based upon: CHF History: 0 HTN History: 1 Age : 1 Diabetes History: 1 Stroke History: 2 Vascular Disease History: 0 Gender: 0  Patient has failed these meds in past: n/a Patient is currently controlled on the following medications:   flecainide 50 mg q12h,   Eliquis 5 mg BID  We discussed: Benefits of Eliquis for stroke prevention; Bleeding risks; avoid NSAIDs;   Plan  Continue current medications   Heart Failure / Hypertension   Type: Diastolic  Last ejection fraction: 10/10/2018 60-65%  BP goal is:  <130/80 BP Readings from Last 3 Encounters:  10/27/19 (!) 140/64  10/27/19 (!) 140/64  07/29/19 (!) 147/67   Patient has failed these meds in past: amlodipine, furosemide, metoprolol tartrate,   Patient is currently controlled on the following medications:   torsemide 20 mg x 3 daily,   potassium 40 mEq HS (20 meq x 2)  We discussed: takes loop diuretic 4-5 days per week, he does not take it on days he has to go out because it causes frequent urination; pt reports SOB but unrelated to fluid per cardiologist - obesity contributing.  Plan  Continue current medications    Diabetes   A1c goal < 7%  Recent Relevant Labs: Lab Results  Component Value Date/Time   HGBA1C 6.4 (H) 10/27/2019 02:00 PM   HGBA1C 6.5 (A) 11/14/2018 03:02 PM   HGBA1C 6.0 05/16/2018 04:16 PM   MICROALBUR 7.1 10/27/2019 02:00 PM   MICROALBUR 1.1 12/12/2006 08:55 AM    No medication indicated  We discussed: diet and exercise extensively;  Discussed importance of lifestyle modifications if he wants to avoid more medications. Pt reports he was able to lose ~100 lbs before and after bariatric surgery while on calorie restrictive diet.   Plan  Continue control with diet and exercise  Hyperlipidemia/ASCVD   LDL goal < 70  Lipid Panel     Component Value Date/Time   CHOL 132 10/27/2019 1400   TRIG 191 (H) 10/27/2019 1400   HDL 29 (L) 10/27/2019 1400   CHOLHDL 4.6  10/27/2019 1400   VLDL 40.6 (H) 05/16/2018 1616   LDLCALC 74 10/27/2019 1400   LDLDIRECT 91.0 05/16/2018 1616   The ASCVD Risk score (Goff DC Jr., et al., 2013) failed to calculate for the following reasons:   The patient has a prior MI or stroke diagnosis (embolic CVA)   Patient has failed these meds in past: n/a Patient is currently controlled on the following medications:  atorvastatin 80 mg daily AM  We discussed:  diet and exercise extensively; no antiplatelet due to Eliquis  Plan  Continue current medications and control with diet and exercise    Depression/anxiety   Depression screen [facility] 2/9 10/27/2019 10/27/2019 11/14/2018  Decreased Interest 0 0 1  Down, Depressed, Hopeless 0 0 1  PHQ - 2 Score 0 0 2  Altered sleeping - - 1  Tired, decreased energy - - 2  Change in appetite - - 1  Feeling bad or failure about yourself  - - 1  Trouble concentrating - - 1  Moving slowly or fidgety/restless - - 0  Suicidal thoughts - - 0  PHQ-9 Score - - 8  Difficult doing work/chores - - Somewhat difficult  Some recent data might be hidden   Patient has failed these meds in past: n/a Patient is currently controlled on the following medications:   sertraline 150 mg HS,   lorazepam 0.5 mg HS  We discussed: Pt reports good control with medications as prescribed, denies issues currently  Plan  Continue current medications   Pain   Patient has failed these meds in past: hydrocodone/APAP Patient is currently controlled on the following medications:   tramadol 50 mg q8h prn (typically BID)  lidocaine 5% patch  We discussed:  Can't take NSAIDs due to Eliquis, tramadol helps a lot. He does endorse significant back pain making it difficult to exercise or even stand for short periods of time. Encouraged weight loss to help with pain as well.  Plan  Continue current medications  Medication Management   Pt uses [facility] for all medications Uses pill box Pt  endorses 100% compliance  We discussed: pt is satisfied with current medication management strategy. Medications are mostly affordable except for Eliquis, which we will pursue PAP - Pt reports he lost PAP paperwork so I will resend.  Plan  Continue current medication management strategy    Follow up: 3 month phone visit  Charlene Brooke, PharmD, [facility] Clinical Pharmacist [facility] [phone]

## 2019-11-29 ENCOUNTER — Other Ambulatory Visit: Payer: Self-pay | Admitting: Internal Medicine

## 2019-11-29 DIAGNOSIS — I480 Paroxysmal atrial fibrillation: Secondary | ICD-10-CM

## 2019-12-02 NOTE — Progress Notes (Signed)
Patient has [employer] and reports copay for Eliquis is cost prohibitive at this time.  Reviewed application process for [employer] patient assistance program. Patient meets income/out of pocket spend criteria for the program. Provider portion was completed in collaboration with PCP Dr Ronnald Ramp and faxed to [place]. Patient portion was mailed to patient to complete and mail back to [facility].   Patient assistance program phone number: [place], [facility]

## 2019-12-04 ENCOUNTER — Ambulatory Visit (INDEPENDENT_AMBULATORY_CARE_PROVIDER_SITE_OTHER): Payer: Medicare Other | Admitting: Internal Medicine

## 2019-12-04 ENCOUNTER — Other Ambulatory Visit: Payer: Self-pay

## 2019-12-04 ENCOUNTER — Encounter: Payer: Self-pay | Admitting: Internal Medicine

## 2019-12-04 VITALS — BP 141/77 | HR 85 | Ht 72.0 in | Wt 390.0 lb

## 2019-12-04 DIAGNOSIS — G464 Cerebellar stroke syndrome: Secondary | ICD-10-CM | POA: Diagnosis not present

## 2019-12-04 DIAGNOSIS — R0600 Dyspnea, unspecified: Secondary | ICD-10-CM | POA: Diagnosis not present

## 2019-12-04 DIAGNOSIS — R0609 Other forms of dyspnea: Secondary | ICD-10-CM

## 2019-12-04 DIAGNOSIS — I5032 Chronic diastolic (congestive) heart failure: Secondary | ICD-10-CM | POA: Diagnosis not present

## 2019-12-04 DIAGNOSIS — I10 Essential (primary) hypertension: Secondary | ICD-10-CM | POA: Diagnosis not present

## 2019-12-04 DIAGNOSIS — I480 Paroxysmal atrial fibrillation: Secondary | ICD-10-CM

## 2019-12-04 NOTE — Patient Instructions (Addendum)
Medication Instructions:  Your physician recommends that you continue on your current medications as directed. Please refer to the Current Medication list given to you today.  *If you need a refill on your cardiac medications before your next appointment, please call your pharmacy*   Lab Work: None ordered  If you have labs (blood work) drawn today and your tests are completely normal, you will receive your results only by: [name] MyChart Message (if you have MyChart) OR . A paper copy in the mail If you have any lab test that is abnormal or we need to change your treatment, we will call you to review the results.   Testing/Procedures: None ordered   Follow-Up: At [facility], you and your health needs are our priority.  As part of our continuing mission to provide you with exceptional heart care, we have created designated Provider Care Teams.  These Care Teams include your primary Cardiologist (physician) and Advanced Practice Providers (APPs -  Physician Assistants and Nurse Practitioners) who all work together to provide you with the care you need, when you need it.  We recommend signing up for the patient portal called "MyChart".  Sign up information is provided on this After Visit Summary.  MyChart is used to connect with patients for Virtual Visits (Telemedicine).  Patients are able to view lab/test results, encounter notes, upcoming appointments, etc.  Non-urgent messages can be sent to your provider as well.   To learn more about what you can do with MyChart, go to [email or web address].    Your next appointment:   6 month(s)  The format for your next appointment:   In Person  Provider:   You may see Pixie Casino, MD or one of the following Advanced Practice Providers on your designated Care Team:    Almyra Deforest, PA-C  Fabian Sharp, PA-C or   Roby Lofts, [place]    Other Instructions

## 2019-12-04 NOTE — Progress Notes (Signed)
OFFICE NOTE  Chief Complaint:  Follow-up dyspnea  Primary Care Physician: Janith Lima, MD  HPI:  [name] is a 74 y/o male with a history of bariatric surgery, one year ago, lost about 85 pounds before and after surgery. Also has a history of hypertension and sleep apnea. He has been admitted twice in the last month with an unusual presentation with multiple medical problems, including pulmonary embolus, moderate to large pericardial effusion, new onset a-fib and GI bleed. His PE was diagnosed by his PCP through outpatient w/u and he was placed on Xarelto. His first hospital admission was from 4/15-4/24. At that time his CC was chest pain. W/u suggested a diagnosis of pericarditis. An Echocardiogram was done, which showed moderate to large pericardial effusion. Meanwhile the night of 4/16 he went in to afib with RVR, probably secondary to PE and was transferred to step down and started on cardizem drip. He converted to sinus the same night , cardizem discontinued and he was resumed on metoprolol. CT surgery was consulted for pericardial window. He underwent pericardial window on 4/17 and hemorrhagic fluid was drained and was sent for analysis. Gram stain and cultures were negative. He was also started on colchicine for his pericarditis. The evening of 4/20 pt went back in to afib with RVR and had bloody bowel movement. His xarelto was discontinued and he was put on cardizem gtt. Over the next 24 hours his hemoglobin dropped to 7.8 and he received 2 units of prbc transfusion. His repeat H&H improved. Dr Henrene Pastor was consulted from GI on 4/20 and he underwent endoscopy on 4/21 . He was found to have superficial ulcers in the stomach, underwent biopsy of the area. He was recommended to continue on PPI twice daily for 2 weeks and resume xarelto. He was discharged home then returned 3 days later on 07/28/13 with a complaint of increased fatigue and SOB. Repeat 2D echo demonstrated only minimal  residual effusion with normal RV and LV function. 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 He was admitted and underwent a repeat EGD which showed healing of ulcers, no bleed. He was continued on PO iron and a PPI. His Hgb at time of discharge was stable at 9.0. He was discharged home. Cardiology had recommended an OP stress test. This was done 08/03/13 and showed normal myocardial perfusion and normal LV EF of 58%.  He returns today and has reported a marked improvement in his shortness of breath. He is able to do most activities without any difficulty. He continues on Eliquis for his pulmonary embolus and for stroke prevention. He is on flecainide for antiarrhythmic therapy. He denies any chest pain. His only complaint is fatigue. He reports some fatigue and notices it worse as he has been working on refurbishing a house that his mother used to own to sell. He reports his sleep is good at night however he does have difficulty falling asleep which may be playing a role in his fatigue.  [name] returns today for follow-up. He reports no significant difference in his symptoms. He gets some mild shortness of breath with exercise which she does very infrequently. He also reports leg swelling which is persistent. Weight has been fairly stable although could stand to go down. We talked about ways he could work on weight loss. He does have a history of lap band in the past however he is not been able to lose anymore weight and in fact  has gained it back. He denies any recurrent palpitation or A. Fib that he is aware of. He continues on flecainide which he is tolerating without any difficulty.  04/28/2016  [name] was seen back today in follow-up. He continues to complain of shortness of breath, persistent with exertion. Weight is now up from 337-350 pounds. He is celebrating the birth of this newest grandchild and wants to be more active and is frustrated that he is not able to do much activity  without shortness of breath. He is on flecainide without any recurrent atrial fibrillation. His last stress test was more than 2 years ago and he is due for repeat stress test.  05/23/2016  [name] returns today for follow-up. He is unfortunately gained another few pounds although he felt that this is related to stuff in his pockets. His stress test was negative for ischemia. It is noted that his blood pressure is high again today 168/89. He was previously elevated over 150. He does have a history of hypertension and was on Lotrel in the past but was taken off of that due to hypotension after significant weight loss that occurred after lap band surgery. Unfortunately he has gained back most of that weight and likely is hypertensive secondary to that.  11/22/2016  [name] was seen today in follow-up. He continues to have some progressive shortness of breath with exertion. 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 I wonder if this is playing a role in upper airway obstruction which could be causing his shortness of breath. Blood pressure appears to be better controlled today although he was taken off of blood pressure medications when he had a stroke. He was also previously on a number of inhalers including Symbicort and Breo Ellipta - these have also been discontinued.  02/20/2017  [name] returns today for follow-up.  I referred him to pulmonary for evaluation of his dyspnea.  He had no improvement with inhalers and underwent repeat pulmonary function testing by Dr. Lake Bells, which failed to reveal any significant obstructive pulmonary disease.  It was felt that he might be volume overloaded and he was switched from Lasix to torsemide.  It appears the dose was equivalent.  He is not diuresed anymore and says that he is not more short of breath however we will need to recheck his metabolic profile to ensure  he is not being over diuresed.  With regards to vocal cord dysfunction, he has follow-up at [facility] for evaluation of possible surgery.  It is not clear whether this will improve his shortness of breath not and I advised him to ask his surgeon whether the surgery would help more with his breathing or phonation.  01/09/2018  [name] is seen today in follow-up.  Overall he continues to have shortness of breath.  Weight is still significant issue.  He is essentially gained back all the weight that he lost after weight loss surgery.  He was recently seen by Rosaria Ferries, PA-C.  Who noted that his weighted maintained a stable level on torsemide 60 mg daily.  This was previously increased by his PCP.  He continues to keep off the edema but has had no significant change in his shortness of breath.  He is been working with Dr. Letta Pate in [facility], who has been giving him pain injections in his back.  Recently this is been helpful for him.  12/04/2019  [name] is seen today in follow-up.  He has persistent shortness of breath which worsened significantly recently when he was off of his torsemide for several days.  He then restarted it and ultimately got back to his baseline.  Weight is up about 8 pounds and has had continued issue losing it.  He is concerned about the cost of Eliquis and asked for some samples today.  He is taking the medicine.  He is not aware of any A. fib.  EKG does not show A. fib today.  He does have a history of LAP-BAND procedure but did not follow-up with general surgery after that.  PMHx:  Past Medical History:  Diagnosis Date  . Anemia 07/29/2013  . Asthma    childhood asthma  . Benign essential HTN   . Benign prostatic hypertrophy    several prostate biopsies neg for cancer.   . Bilateral hip pain   . Cancer ([place])   . CHF (congestive heart failure) ([place])   . Chronic anticoagulation 07/22/2013  . Complication of anesthesia    MORPHINE CAUSES HALLUCINATIONS  .  CVA (cerebral vascular accident) ([place]) 06/2016  . Depression   . Diastolic congestive heart failure ([place]) 07/16/2013  . Dysphagia, post-stroke   . Embolic cerebral infarction ([place]) 06/21/2016  . FH: colonic polyps 2010  . Gout   . History of kidney stones   . Hypertension   . Kidney cysts   . Morbid obesity ([place])   . Numbness    feet / legs  . PAF (paroxysmal atrial fibrillation) with RVR 07/29/2013  . Pericarditis 2015   s/p window  . Recurrent pulmonary emboli ([place]) 06/13/2015  . Sleep apnea    USES C PAP  . Stroke syndrome   . 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 ARTHROSCOPY KNEE W/ DRILLING    . BREATH TEK H PYLORI N/A 12/23/2012   Procedure: BREATH TEK H PYLORI;  Surgeon: Gayland Curry, MD;  Location: [facility];  Service: General;  Laterality: N/A;  . CHOLECYSTECTOMY  1989  . COLONOSCOPY N/A 07/31/2013   Procedure: COLONOSCOPY;  Surgeon: Gatha Mayer, MD;  Location: [place];  Service: Endoscopy;  Laterality: N/A;  . ESOPHAGOGASTRODUODENOSCOPY N/A 07/22/2013   Procedure: ESOPHAGOGASTRODUODENOSCOPY (EGD);  Surgeon: Irene Shipper, MD;  Location[facility] ENDOSCOPY;  Service: Endoscopy;  Laterality: N/A;  . ESOPHAGOGASTRODUODENOSCOPY N/A 07/31/2013   Procedure: ESOPHAGOGASTRODUODENOSCOPY (EGD);  Surgeon: Gatha Mayer, MD;  Location: [facility] ENDOSCOPY;  Service: Endoscopy;  Laterality: N/A;  . INTRAOPERATIVE TRANSESOPHAGEAL ECHOCARDIOGRAM N/A 07/18/2013   Procedure: INTRAOPERATIVE TRANSESOPHAGEAL ECHOCARDIOGRAM;  Surgeon: Grace Isaac, MD;  Location: [place];  Service: Open Heart Surgery;  Laterality: N/A;  . LAPAROSCOPIC GASTRIC BANDING WITH HIATAL HERNIA REPAIR N/A 04/08/2013   Procedure: LAPAROSCOPIC GASTRIC BANDING WITH possible  HIATAL HERNIA REPAIR;  Surgeon: Gayland Curry, MD;  Location: [facility];  Service: General;  Laterality: N/A;  . LITHOTRIPSY    . renal calculi    . SUBXYPHOID PERICARDIAL WINDOW N/A 07/18/2013   Procedure:  SUBXYPHOID PERICARDIAL WINDOW;  Surgeon: Grace Isaac, MD;  Location: [place];  Service: Thoracic;  Laterality: N/A;  . TONSILLECTOMY    . total knee replacment     bilat  . trigger finger repair     also lue, cts surgically repaired  . WISDOM TOOTH EXTRACTION      FAMHx:  Family History  Problem Relation Age of Onset  . Depression Mother   .  Hypertension Mother   . Dementia Mother   . Heart disease Father        MI  . Depression Brother   . Stroke Paternal Aunt        CVA  . Diabetes Paternal Uncle   . Heart disease Paternal Uncle        MI  . Heart disease Paternal Grandmother        MI  . Diabetes Cousin        PATERNAL    SOCHx:   reports that he quit smoking about 45 years ago. His smoking use included cigarettes. He started smoking about 58 years ago. He has a 2.60 pack-year smoking history. He has never used smokeless tobacco. He reports that he does not drink alcohol and does not use drugs.  ALLERGIES:  Allergies  Allergen Reactions  . Morphine Nausea And Vomiting and Other (See Comments)    Severe hallucinations  . Codeine Nausea Only  . Penicillins Other (See Comments)    Passed out after injection Did it involve swelling of the face/tongue/throat, SOB, or low BP? Unknown Did it involve sudden or severe rash/hives, skin peeling, or any reaction on the inside of your mouth or nose? No Did you need to seek medical attention at a hospital or doctor's office? Yes When did it last happen?teenager If all above answers are "NO", may proceed with cephalosporin use.  [name] Propoxyphene N-Acetaminophen Nausea Only    ROS: Pertinent items noted in HPI and remainder of comprehensive ROS otherwise negative.  HOME MEDS: Current Outpatient Medications  Medication Sig Dispense Refill  . atorvastatin (LIPITOR) 80 MG tablet Take 1 tablet by mouth once daily 90 tablet 0  . [employer] MELATONIN GUMMIES PO Take by mouth.    [name] Sodium (DSS) 100 MG CAPS Take 100 mg by  mouth every other day.    [name] ELIQUIS 5 MG TABS tablet Take 1 tablet by mouth twice daily 180 tablet 0  . finasteride (PROSCAR) 5 MG tablet Take 1 tablet (5 mg total) by mouth daily. 30 tablet 1  . flecainide (TAMBOCOR) 50 MG tablet Take 1 tablet (50 mg total) by mouth every 12 (twelve) hours. OV NEEED 180 tablet 3  . loratadine (CLARITIN) 10 MG tablet Take 10 mg by mouth daily.    [name] LORazepam (ATIVAN) 0.5 MG tablet Take 1 tablet (0.5 mg total) by mouth at bedtime. 90 tablet 1  . montelukast (SINGULAIR) 10 MG tablet TAKE 1 TABLET BY MOUTH AT BEDTIME 90 tablet 0  . Multiple Vitamin (MULTIVITAMIN WITH MINERALS) TABS tablet Take 1 tablet by mouth daily.    [name] OVER THE COUNTER MEDICATION Take 1 tablet by mouth daily. Multi vitamin with iron    . pantoprazole (PROTONIX) 40 MG tablet Take 1 tablet by mouth once daily 90 tablet 0  . potassium chloride SA (KLOR-CON) 20 MEQ tablet TAKE 1 TABLET BY MOUTH TWICE DAILY **AFTERNOON  AND  BEDTIME** (Patient taking differently: One tablet daily) 180 tablet 2  . rOPINIRole (REQUIP) 0.5 MG tablet TAKE 1 TABLET BY MOUTH THREE TIMES DAILY 270 tablet 5  . sertraline (ZOLOFT) 100 MG tablet Take 1.5 tablets (150 mg total) by mouth at bedtime. 135 tablet 1  . torsemide (DEMADEX) 20 MG tablet TAKE 3 TABLETS BY MOUTH ONCE DAILY. KEEP APPOINTMENT AS SCHEDULED 90 tablet 11  . traMADol (ULTRAM) 50 MG tablet TAKE 1 TABLET BY MOUTH EVERY 8 HOURS AS NEEDED 90 tablet 0   No current facility-administered medications  for this visit.    LABS/IMAGING: No results found for this or any previous visit (from the past 48 hour(s)). No results found.  VITALS: BP (!) 141/77   Pulse 85   Ht 6' (1.829 m)   Wt (!) 390 lb (176.9 kg)   SpO2 97%   BMI 52.89 kg/m   EXAM: General appearance: alert, no distress and morbidly obese Neck: no carotid bruit, no JVD and thyroid not enlarged, symmetric, no tenderness/mass/nodules Lungs: diminished breath sounds bilaterally Heart: regular rate  and rhythm Abdomen: morbidly obese Extremities: extremities normal, atraumatic, no cyanosis or edema Pulses: 2+ and symmetric Skin: pale, warm, diaphoretic Neurologic: Grossly normal Psych: Pleasant  EKG: Normal sinus rhythm at 85, RBBB, lateral T wave changes-personally reviewed  ASSESSMENT: 1. Persistent dyspnea on exertion 2. Recent stroke with partial vocal cord paralysis 3. Paroxysmal atrial fibrillation on flecainide 4. Hyperlipidemia 5. Hypertension 6. Recent history of pericarditis/pericardial effusion status post pericardial window 7. Pulmonary embolus 8. Morbid obesity 9. Obstructive sleep apnea on CPAP 10. RBBB  PLAN: 1.   Mr. Berthelot continues to be short of breath.  Weight is a significant factor however there is probably some chronic diastolic congestive heart failure.  He stopped his torsemide after several days and noted marked worsening of his shortness of breath which improved when he restarted it.  EKG shows sinus rhythm today without any significant QT prolongation on flecainide.  There is a right bundle branch block.  He reports compliance with anticoagulation.  I advise considering reaching out to the bariatric surgery department to see if they could further adjust his LAP-BAND and work with him on other weight loss alternatives.  Follow-up with me annually or sooner as necessary.  Pixie Casino, MD, [facility], [place] Director of the Advanced Lipid Disorders &  Cardiovascular Risk Reduction Clinic Attending Cardiologist  Direct Dial: [phone]  Fax: [phone]  Website:  [email or web address] 12/04/2019, 1:56 PM

## 2019-12-15 ENCOUNTER — Telehealth: Payer: Self-pay | Admitting: Pharmacist

## 2019-12-15 NOTE — Progress Notes (Signed)
Chronic Care Management Pharmacy Assistant   Name: [name]  MRN: [id number] DOB: 12-01-1945  Reason for Encounter: PAP    PCP : Janith Lima, MD  Allergies:   Allergies  Allergen Reactions  . Morphine Nausea And Vomiting and Other (See Comments)    Severe hallucinations  . Codeine Nausea Only  . Penicillins Other (See Comments)    Passed out after injection Did it involve swelling of the face/tongue/throat, SOB, or low BP? Unknown Did it involve sudden or severe rash/hives, skin peeling, or any reaction on the inside of your mouth or nose? No Did you need to seek medical attention at a hospital or doctor's office? Yes When did it last happen?teenager If all above answers are "NO", may proceed with cephalosporin use.  [name] Propoxyphene N-Acetaminophen Nausea Only    Medications: Outpatient Encounter Medications as of 12/15/2019  Medication Sig  . atorvastatin (LIPITOR) 80 MG tablet Take 1 tablet by mouth once daily  . [employer] MELATONIN GUMMIES PO Take by mouth.  [name] Sodium (DSS) 100 MG CAPS Take 100 mg by mouth every other day.  [name] ELIQUIS 5 MG TABS tablet Take 1 tablet by mouth twice daily  . finasteride (PROSCAR) 5 MG tablet Take 1 tablet (5 mg total) by mouth daily.  . flecainide (TAMBOCOR) 50 MG tablet Take 1 tablet (50 mg total) by mouth every 12 (twelve) hours. OV NEEED  . loratadine (CLARITIN) 10 MG tablet Take 10 mg by mouth daily.  [name] LORazepam (ATIVAN) 0.5 MG tablet Take 1 tablet (0.5 mg total) by mouth at bedtime.  . montelukast (SINGULAIR) 10 MG tablet TAKE 1 TABLET BY MOUTH AT BEDTIME  . Multiple Vitamin (MULTIVITAMIN WITH MINERALS) TABS tablet Take 1 tablet by mouth daily.  [name] OVER THE COUNTER MEDICATION Take 1 tablet by mouth daily. Multi vitamin with iron  . pantoprazole (PROTONIX) 40 MG tablet Take 1 tablet by mouth once daily  . potassium chloride SA (KLOR-CON) 20 MEQ tablet TAKE 1 TABLET BY MOUTH TWICE DAILY **AFTERNOON  AND  BEDTIME**  (Patient taking differently: One tablet daily)  . rOPINIRole (REQUIP) 0.5 MG tablet TAKE 1 TABLET BY MOUTH THREE TIMES DAILY  . sertraline (ZOLOFT) 100 MG tablet Take 1.5 tablets (150 mg total) by mouth at bedtime.  . torsemide (DEMADEX) 20 MG tablet TAKE 3 TABLETS BY MOUTH ONCE DAILY. KEEP APPOINTMENT AS SCHEDULED  . traMADol (ULTRAM) 50 MG tablet TAKE 1 TABLET BY MOUTH EVERY 8 HOURS AS NEEDED  . [DISCONTINUED] apixaban (ELIQUIS) 5 MG TABS tablet Take 1 tablet (5 mg total) by mouth 2 (two) times daily.  . [DISCONTINUED] atorvastatin (LIPITOR) 80 MG tablet Take 1 tablet (80 mg total) by mouth daily.  . [DISCONTINUED] montelukast (SINGULAIR) 10 MG tablet Take 1 tablet (10 mg total) by mouth at bedtime.  . [DISCONTINUED] pantoprazole (PROTONIX) 40 MG tablet Take 1 tablet by mouth once daily   No facility-administered encounter medications on file as of 12/15/2019.    Current Diagnosis: Patient Active Problem List   Diagnosis Date Noted  . Acute foot pain, left 11/14/2018  . Type II diabetes mellitus with manifestations ([place]) 11/14/2018  . B12 deficiency 05/16/2018  . Lumbosacral spondylosis without myelopathy 12/18/2017  . 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 Vocal cord dysfunction 11/22/2016  . Benign essential HTN   . 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 Embolic cerebral infarction ([place]) 06/21/2016  . OSA (obstructive sleep apnea)   . Dysphagia, post-stroke   . Neurologic  gait disorder   . Encounter for monitoring flecainide therapy 04/28/2016  . Recurrent pulmonary emboli ([place]) 06/13/2015  . GERD (gastroesophageal reflux disease) 06/13/2015  . Basal cell carcinoma of skin 06/28/2014  . 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 PAF (paroxysmal atrial fibrillation) ([place]) 07/29/2013  . Chronic anticoagulation 07/22/2013  . Gastric ulcer with hemorrhage 07/20/13 07/22/2013  . Diastolic congestive heart failure ([place]) 07/16/2013  . H/O laparoscopic adjustable gastric banding  & hiatal hernia repair 04/08/13 04/23/2013  . Morbid obesity ([place]) 08/09/2012  . Peripheral neuropathy ([place]) 11/02/2009  . Hyperlipidemia with target LDL less than 130 09/10/2008  . GOUT 06/28/2006  . BPH (benign prostatic hyperplasia) 06/28/2006    Goals Addressed   None     Follow-Up:  Pharmacist Review   Per clinical pharmacist I was instructed to call the patient to follow up to see if he mailed his paperwork for his PAP. The patient stated that he will be picking his form up from DR. Jones office and he will be mailing it out this week.    Rosendo Gros, [place] Pharmacist Assistant  [phone]

## 2019-12-25 ENCOUNTER — Other Ambulatory Visit: Payer: Self-pay | Admitting: Physical Medicine & Rehabilitation

## 2019-12-29 NOTE — Progress Notes (Signed)
Eliquis patient assistance: [employer]  Patient brought proof of income and prescription cost documents to office along with signed application. Faxed completed application to [employer] today.

## 2020-01-16 NOTE — Progress Notes (Signed)
Eliquis PAP approved through 04/02/2020.

## 2020-01-19 ENCOUNTER — Other Ambulatory Visit: Payer: Self-pay | Admitting: Internal Medicine

## 2020-01-19 ENCOUNTER — Telehealth: Payer: Self-pay | Admitting: Pharmacist

## 2020-01-19 DIAGNOSIS — K2100 Gastro-esophageal reflux disease with esophagitis, without bleeding: Secondary | ICD-10-CM

## 2020-01-19 DIAGNOSIS — H1010 Acute atopic conjunctivitis, unspecified eye: Secondary | ICD-10-CM

## 2020-01-19 NOTE — Progress Notes (Signed)
Chronic Care Management Pharmacy Assistant   Name: [name]  MRN: [id number] DOB: May 02, 1945  Reason for Encounter: PAP follow up  PCP : Janith Lima, MD  Allergies:   Allergies  Allergen Reactions   Morphine Nausea And Vomiting and Other (See Comments)    Severe hallucinations   Codeine Nausea Only   Penicillins Other (See Comments)    Passed out after injection Did it involve swelling of the face/tongue/throat, SOB, or low BP? Unknown Did it involve sudden or severe rash/hives, skin peeling, or any reaction on the inside of your mouth or nose? No Did you need to seek medical attention at a hospital or doctor's office? Yes When did it last happen?teenager If all above answers are NO, may proceed with cephalosporin use.   Propoxyphene N-Acetaminophen Nausea Only    Medications: Outpatient Encounter Medications as of 01/19/2020  Medication Sig   montelukast (SINGULAIR) 10 MG tablet TAKE 1 TABLET BY MOUTH AT BEDTIME   pantoprazole (PROTONIX) 40 MG tablet Take 1 tablet by mouth once daily   atorvastatin (LIPITOR) 80 MG tablet Take 1 tablet by mouth once daily   [facility] MELATONIN GUMMIES PO Take by mouth.   Docusate Sodium (DSS) 100 MG CAPS Take 100 mg by mouth every other day.   ELIQUIS 5 MG TABS tablet Take 1 tablet by mouth twice daily   finasteride (PROSCAR) 5 MG tablet Take 1 tablet (5 mg total) by mouth daily.   flecainide (TAMBOCOR) 50 MG tablet Take 1 tablet (50 mg total) by mouth every 12 (twelve) hours. OV NEEED   loratadine (CLARITIN) 10 MG tablet Take 10 mg by mouth daily.   LORazepam (ATIVAN) 0.5 MG tablet Take 1 tablet (0.5 mg total) by mouth at bedtime.   Multiple Vitamin (MULTIVITAMIN WITH MINERALS) TABS tablet Take 1 tablet by mouth daily.   OVER THE COUNTER MEDICATION Take 1 tablet by mouth daily. Multi vitamin with iron   potassium chloride SA (KLOR-CON) 20 MEQ tablet TAKE 1 TABLET BY MOUTH TWICE DAILY **AFTERNOON  AND   BEDTIME** (Patient taking differently: One tablet daily)   rOPINIRole (REQUIP) 0.5 MG tablet TAKE 1 TABLET BY MOUTH THREE TIMES DAILY   sertraline (ZOLOFT) 100 MG tablet Take 1.5 tablets (150 mg total) by mouth at bedtime.   torsemide (DEMADEX) 20 MG tablet TAKE 3 TABLETS BY MOUTH ONCE DAILY. KEEP APPOINTMENT AS SCHEDULED   traMADol (ULTRAM) 50 MG tablet TAKE 1 TABLET BY MOUTH EVERY 8 HOURS AS NEEDED   [DISCONTINUED] apixaban (ELIQUIS) 5 MG TABS tablet Take 1 tablet (5 mg total) by mouth 2 (two) times daily.   [DISCONTINUED] atorvastatin (LIPITOR) 80 MG tablet Take 1 tablet (80 mg total) by mouth daily.   [DISCONTINUED] montelukast (SINGULAIR) 10 MG tablet Take 1 tablet (10 mg total) by mouth at bedtime.   [DISCONTINUED] pantoprazole (PROTONIX) 40 MG tablet Take 1 tablet by mouth once daily   No facility-administered encounter medications on file as of 01/19/2020.    Current Diagnosis: Patient Active Problem List   Diagnosis Date Noted   Acute foot pain, left 11/14/2018   Type II diabetes mellitus with manifestations ([place]) 11/14/2018   B12 deficiency 05/16/2018   Lumbosacral spondylosis without myelopathy 12/18/2017   Vocal fold paralysis, 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   Vocal cord dysfunction 11/22/2016   Benign essential HTN    Wallenberg syndrome 51/05/5850   Embolic cerebral infarction ([place]) 06/21/2016   OSA (obstructive sleep apnea)    Dysphagia, post-stroke    Neurologic  gait disorder    Encounter for monitoring flecainide therapy 04/28/2016   Recurrent pulmonary emboli ([place]) 06/13/2015   GERD (gastroesophageal reflux disease) 06/13/2015   Basal cell carcinoma of skin 06/28/2014   Periodic limb movement sleep disorder 06/19/2014   PAF (paroxysmal atrial fibrillation) ([place]) 07/29/2013   Chronic anticoagulation 07/22/2013   Gastric ulcer with hemorrhage 07/20/13 18/40/3754   Diastolic congestive heart failure ([place]) 07/16/2013   H/O laparoscopic adjustable  gastric banding & hiatal hernia repair 04/08/13 04/23/2013   Morbid obesity ([place]) 08/09/2012   Peripheral neuropathy ([place]) 11/02/2009   Hyperlipidemia with target LDL less than 130 09/10/2008   GOUT 06/28/2006   BPH (benign prostatic hyperplasia) 06/28/2006    Goals Addressed   None     Follow-Up:  Pharmacist Review    Following up with [employer] to see if they had received the patient's patient Assistance form for Eliquis. I spoke with repesentative [name], who states that the application has been received and sent to the processor to be reviewed which can take up to 3 business days, will forward information to clinical pharmacist.  [name], [facility]  Practice Team Manager/ CPA (Clinical Pharmacist Assistant) [phone]

## 2020-01-20 NOTE — Progress Notes (Signed)
Received fax from [employer] that patient's Eliquis PAP is approved through 04/02/2020. Patient informed.

## 2020-01-23 ENCOUNTER — Other Ambulatory Visit (HOSPITAL_COMMUNITY): Payer: Self-pay | Admitting: Psychiatry

## 2020-01-23 ENCOUNTER — Other Ambulatory Visit: Payer: Self-pay | Admitting: Internal Medicine

## 2020-01-23 DIAGNOSIS — F330 Major depressive disorder, recurrent, mild: Secondary | ICD-10-CM

## 2020-01-23 DIAGNOSIS — E785 Hyperlipidemia, unspecified: Secondary | ICD-10-CM

## 2020-02-07 ENCOUNTER — Other Ambulatory Visit: Payer: Self-pay | Admitting: Physical Medicine & Rehabilitation

## 2020-02-10 ENCOUNTER — Encounter: Payer: Self-pay | Admitting: Internal Medicine

## 2020-02-10 ENCOUNTER — Other Ambulatory Visit: Payer: Self-pay | Admitting: Physical Medicine & Rehabilitation

## 2020-02-11 ENCOUNTER — Telehealth (HOSPITAL_COMMUNITY): Payer: Self-pay | Admitting: *Deleted

## 2020-02-11 NOTE — Telephone Encounter (Signed)
Please call the pharmacy to clarify.  Narcan is used for opiate overdose.  I do not see any narcotic pain medication in his list.  He is taking tramadol from other physician to take as needed and I do not see any history of taking overuse.

## 2020-02-11 NOTE — Telephone Encounter (Signed)
Pt called to inform that his pharmacist told him that because he's on Tramadol, Lorazepam, and Zoloft that he should have an order for Narcan. Pt is afraid now that he may have an AE due to these medications, please review and advise. Pt next appointment is on 05/06/20.

## 2020-02-17 ENCOUNTER — Telehealth: Payer: Self-pay | Admitting: *Deleted

## 2020-02-17 NOTE — Telephone Encounter (Signed)
Mr Ausburn has PURCHASED an IT trainer wheelchair and it is paid for --he did not use medicare.  He is asking if Dr Letta Pate would write a prescription for the wheelchair so that he can take it to the company and avoid paying tax on the item.  This is so he will get a tax reimbursement only.

## 2020-02-18 NOTE — Telephone Encounter (Signed)
Mr. Bollig called back. Please call him if when the letter is ready. If granted.

## 2020-02-24 ENCOUNTER — Encounter: Payer: Medicare Other | Attending: Physical Medicine & Rehabilitation | Admitting: Physical Medicine & Rehabilitation

## 2020-02-24 ENCOUNTER — Other Ambulatory Visit: Payer: Self-pay

## 2020-02-24 ENCOUNTER — Encounter: Payer: Self-pay | Admitting: Physical Medicine & Rehabilitation

## 2020-02-24 VITALS — BP 153/78 | HR 69 | Temp 98.6°F | Ht 72.0 in | Wt 390.4 lb

## 2020-02-24 DIAGNOSIS — M47817 Spondylosis without myelopathy or radiculopathy, lumbosacral region: Secondary | ICD-10-CM | POA: Diagnosis not present

## 2020-02-24 DIAGNOSIS — R269 Unspecified abnormalities of gait and mobility: Secondary | ICD-10-CM

## 2020-02-24 DIAGNOSIS — I69398 Other sequelae of cerebral infarction: Secondary | ICD-10-CM

## 2020-02-24 DIAGNOSIS — G463 Brain stem stroke syndrome: Secondary | ICD-10-CM

## 2020-02-24 MED ORDER — TRAMADOL HCL 50 MG PO TABS
50.0000 mg | ORAL_TABLET | Freq: Three times a day (TID) | ORAL | 5 refills | Status: DC | PRN
Start: 2020-02-24 — End: 2020-07-23

## 2020-02-24 NOTE — Patient Instructions (Signed)
Please call if you wish to  Schedule injection    Eating Plan After Bariatric Surgery, Stage 3 A diet after weight loss surgery (bariatric surgery) should provide plenty of fluids and nutrients while promoting weight loss and healing. Each stage of recovery has a different set of food and drink recommendations. Your health care provider may recommend that you work with a diet and nutrition specialist (dietitian) to make a staged eating plan that is right for you. You may start to transition to a stage 3 diet about 5-6 weeks after surgery. During this stage, you will be allowed to eat foods of various textures. Continue to follow the guidelines below until your health care provider or dietitian approves eating a regular weight-maintenance diet. What are tips for following this plan? Cooking  Use low-fat cooking methods, such as baking, broiling, boiling, or grilling.  Cook using healthy fats, such as olive, sunflower, grapeseed, or canola oil.  Cook and bake using no-calorie sweeteners.  Avoid adding extra salt, sugar, or fat to foods when cooking. Meal planning  Eat 3 meals and 2 snacks, or 5 small meals each day.  Eat at set times. Allow 30-45 minutes for each meal.  Do not skip meals or go a long time without eating (fast). If you are having a hard time eating, talk to your health care provider or dietitian.  Limit food intake to -1 cup per meal. As you heal and advance, you may be able to eat a little more with each meal. Always listen to your body.  Eat foods from all food groups. This includes fruits and vegetables, grains, dairy, and meat and other proteins.  Limit carbohydrate intake to no more than 30 g per meal or 130 g per day. There are about 15-20 g of carbohydrates in 1 piece of bread or a medium piece of fruit. Half of your total grains should be whole grains.  Include a protein-rich food at every meal and snack, and eat the protein food first. Eat 60-80 g of protein a  day when possible. General instructions  Work with your dietitian to slowly add new foods to your diet.  Stay hydrated. Drink at least 48-64 oz of noncarbonated, zero-calorie fluid per day. Water is the best choice.  Limit alcohol intake as directed by your health care provider.  Avoid foods that are high in fat or have added sugar.  Take any vitamin supplements as directed by your health care provider.  Ask your dietitian to help you with meal planning and strategies to continue to lose weight and maintain weight loss. Recommended foods  Fruits  All fresh, frozen, or canned fruit. Vegetables  All fresh, frozen, or canned vegetables. Grains  Whole wheat bread, crackers, and pasta.  Rice.  Unsweetened hot and cold cereals. Meats and other protein foods  Lean meat, poultry, and fish.  Eggs and egg substitutes.  Beans and lentils. Smooth nut butters.  Soy products, such as tempeh and tofu. Dairy  Low or nonfat milk, cheese, and yogurt.  Sugar-free pudding.  Cottage cheese. Beverages  Decaffeinated coffee and tea.  Sugar-free, caffeine-free soft drinks. Fats and oils  Avocado.  Olive, sunflower, grapeseed, or canola oil. Sweets and desserts  Low-fat, sugar-free desserts. Seasoning and other foods  Low-fat, low-sodium condiments. The items listed above may not be a complete list of foods and beverages you can eat. Contact a dietitian for more information. Foods to avoid  Hard-to-digest foods, including: ? Popcorn. ? Nuts and seeds. ?  Celery. ? White parts of citrus fruits (pith).  Caffeinated drinks, including: ? Coffee and tea. ? Energy drinks.  Sweets and desserts with more than 25 g of sugar per serving. The items listed above may not be a complete list of foods and beverages you should avoid. Contact a dietitian for more information. Summary  Stage 3 diet starts about 5-6 weeks after surgery. Continue to follow stage 3 guidelines until your  health care provider or dietitian approves eating a regular weight-maintenance diet.  Stage 3 diet involves eating a balanced, healthy diet with foods of various textures. This diet helps you continue to lose weight and maintain weight loss.  Eating enough protein and drinking plenty of fluids is important for promoting weight loss and healing after surgery. This information is not intended to replace advice given to you by your health care provider. Make sure you discuss any questions you have with your health care provider. Document Revised: 08/08/2018 Document Reviewed: 07/24/2016 Elsevier Patient Education  [place].

## 2020-02-24 NOTE — Progress Notes (Signed)
Subjective:    Patient ID: [name], male    DOB: March 25, 1946, 74 y.o.   MRN: [id number]  right-handed male with history of atrial fibrillation, pulmonary emboli, maintained on Eliquis, as well as history of hypertension, obstructive sleep apnea, diastolic congestive heart failure.  He lives with spouse independent with a cane and a walker prior to admission.  One-level home.  Presented June 19, 2016, with headache, sudden onset while watching television leaning to the left side as well as slurred speech.  Cranial CT scan negative.  He did not receive tPA.  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  No proximal occlusion.  No significant stenosis or dissection of carotid arteries.  MRI showed a small left medullary infarct as well as LV4 occlusion.  Interventional Radiology consulted, no need for emergent endovascular treatment.  Echocardiogram with ejection fraction 29%, grade 1 diastolic dysfunction.  DATE OF ADMISSION:  06/21/2016 DATE OF DISCHARGE:  07/12/2016 HPI Chief complaint low back pain with reduced mobility 74 year old male with history of prior left medullary infarct.  He has chronic balance issues related to this.  In addition the patient has lumbar spondylosis which has responded to medial branch blocks.  He has back pain that interferes with mobility but this is fairly well controlled on low-dose tramadol 50 mg 3 times daily.  Other issues include fatigability.  We discussed that he has multiple medical issues that could potentially contribute to this including the aforementioned stroke as well as history of congestive heart failure as well as morbid obesity.  His mobility is also hindered by chronic left knee pain  Reviewed MRI from July 2020.  The patient does have significant degenerative disc at L4-5 and mild to moderate facet hypertrophy at L4-5 and L5-S1.  No significant central canal or foraminal  stenosis. Pain Inventory Average Pain 5 Pain Right Now 6 My pain is aching  In the last 24 hours, has pain interfered with the following? General activity 6 Relation with others 5 Enjoyment of life 5 What TIME of day is your pain at its worst? daytime Sleep (in general) Fair  Pain is worse with: walking, sitting and standing Pain improves with: rest and medication Relief from Meds: 8  Family History  Problem Relation Age of Onset  . Depression Mother   . Hypertension Mother   . Dementia Mother   . Heart disease Father        MI  . Depression Brother   . Stroke Paternal Aunt        CVA  . Diabetes Paternal Uncle   . Heart disease Paternal Uncle        MI  . Heart disease Paternal Grandmother        MI  . Diabetes Cousin        PATERNAL   Social History   Socioeconomic History  . Marital status: Married    Spouse name: Not on file  . Number of children: 1  . Years of education: Not on file  . Highest education level: Not on file  Occupational History  . Occupation: retired-Manager [employer]  Tobacco Use  . Smoking status: Former Smoker    Packs/day: 0.20    Years: 13.00    Pack years: 2.60    Types: Cigarettes    Start date: 04/03/1961    Quit date: 08/30/1974    Years since quitting: 45.5  . Smokeless tobacco: Never Used  Vaping Use  . Vaping Use: Never used  Substance and Sexual Activity  . Alcohol use: No    Alcohol/week: 0.0 standard drinks  . Drug use: No  . Sexual activity: Not Currently  Other Topics Concern  . Not on file  Social History Narrative   Lives at home w/ his wife   Right-handed   Caffeine: decaf coffee, occasional tea   Social Determinants of Health   Financial Resource Strain: Low Risk   . Difficulty of Paying Living Expenses: Not very hard  Food Insecurity: No Food Insecurity  . Worried About Charity fundraiser in the Last Year: Never true  . Ran Out of Food in the Last Year: Never true  Transportation Needs: No  Transportation Needs  . Lack of Transportation (Medical): No  . Lack of Transportation (Non-Medical): No  Physical Activity: Inactive  . Days of Exercise per Week: 0 days  . Minutes of Exercise per Session: 0 min  Stress: Stress Concern Present  . Feeling of Stress : Rather much  Social Connections:   . Frequency of Communication with Friends and Family: Not on file  . Frequency of Social Gatherings with Friends and Family: Not on file  . Attends Religious Services: Not on file  . Active Member of Clubs or Organizations: Not on file  . Attends Archivist Meetings: Not on file  . Marital Status: Not on file   Past Surgical History:  Procedure Laterality Date  . ARTHROSCOPY KNEE W/ DRILLING    . BREATH TEK H PYLORI N/A 12/23/2012   Procedure: BREATH TEK H PYLORI;  Surgeon: Gayland Curry, MD;  Location: [facility];  Service: General;  Laterality: N/A;  . CHOLECYSTECTOMY  1989  . COLONOSCOPY N/A 07/31/2013   Procedure: COLONOSCOPY;  Surgeon: Gatha Mayer, MD;  Location: [place];  Service: Endoscopy;  Laterality: N/A;  . ESOPHAGOGASTRODUODENOSCOPY N/A 07/22/2013   Procedure: ESOPHAGOGASTRODUODENOSCOPY (EGD);  Surgeon: Irene Shipper, MD;  Location: [facility] ENDOSCOPY;  Service: Endoscopy;  Laterality: N/A;  . ESOPHAGOGASTRODUODENOSCOPY N/A 07/31/2013   Procedure: ESOPHAGOGASTRODUODENOSCOPY (EGD);  Surgeon: Gatha Mayer, MD;  Location: [facility] ENDOSCOPY;  Service: Endoscopy;  Laterality: N/A;  . INTRAOPERATIVE TRANSESOPHAGEAL ECHOCARDIOGRAM N/A 07/18/2013   Procedure: INTRAOPERATIVE TRANSESOPHAGEAL ECHOCARDIOGRAM;  Surgeon: Grace Isaac, MD;  Location: [place];  Service: Open Heart Surgery;  Laterality: N/A;  . LAPAROSCOPIC GASTRIC BANDING WITH HIATAL HERNIA REPAIR N/A 04/08/2013   Procedure: LAPAROSCOPIC GASTRIC BANDING WITH possible  HIATAL HERNIA REPAIR;  Surgeon: Gayland Curry, MD;  Location: [facility];  Service: General;  Laterality: N/A;  . LITHOTRIPSY    . renal calculi    .  SUBXYPHOID PERICARDIAL WINDOW N/A 07/18/2013   Procedure: SUBXYPHOID PERICARDIAL WINDOW;  Surgeon: Grace Isaac, MD;  Location: [place];  Service: Thoracic;  Laterality: N/A;  . TONSILLECTOMY    . total knee replacment     bilat  . trigger finger repair     also lue, cts surgically repaired  . WISDOM TOOTH EXTRACTION     Past Surgical History:  Procedure Laterality Date  . ARTHROSCOPY KNEE W/ DRILLING    . BREATH TEK H PYLORI N/A 12/23/2012   Procedure: BREATH TEK H PYLORI;  Surgeon: Gayland Curry, MD;  Location: [facility];  Service: General;  Laterality: N/A;  . CHOLECYSTECTOMY  1989  . COLONOSCOPY N/A 07/31/2013   Procedure: COLONOSCOPY;  Surgeon: Gatha Mayer, MD;  Location: [place];  Service: Endoscopy;  Laterality: N/A;  .  ESOPHAGOGASTRODUODENOSCOPY N/A 07/22/2013   Procedure: ESOPHAGOGASTRODUODENOSCOPY (EGD);  Surgeon: Irene Shipper, MD;  Location: [facility] ENDOSCOPY;  Service: Endoscopy;  Laterality: N/A;  . ESOPHAGOGASTRODUODENOSCOPY N/A 07/31/2013   Procedure: ESOPHAGOGASTRODUODENOSCOPY (EGD);  Surgeon: Gatha Mayer, MD;  Location: [facility] ENDOSCOPY;  Service: Endoscopy;  Laterality: N/A;  . INTRAOPERATIVE TRANSESOPHAGEAL ECHOCARDIOGRAM N/A 07/18/2013   Procedure: INTRAOPERATIVE TRANSESOPHAGEAL ECHOCARDIOGRAM;  Surgeon: Grace Isaac, MD;  Location: [place];  Service: Open Heart Surgery;  Laterality: N/A;  . LAPAROSCOPIC GASTRIC BANDING WITH HIATAL HERNIA REPAIR N/A 04/08/2013   Procedure: LAPAROSCOPIC GASTRIC BANDING WITH possible  HIATAL HERNIA REPAIR;  Surgeon: Gayland Curry, MD;  Location: [facility];  Service: General;  Laterality: N/A;  . LITHOTRIPSY    . renal calculi    . SUBXYPHOID PERICARDIAL WINDOW N/A 07/18/2013   Procedure: SUBXYPHOID PERICARDIAL WINDOW;  Surgeon: Grace Isaac, MD;  Location: [place];  Service: Thoracic;  Laterality: N/A;  . TONSILLECTOMY    . total knee replacment     bilat  . trigger finger repair     also lue, cts surgically repaired  . WISDOM  TOOTH EXTRACTION     Past Medical History:  Diagnosis Date  . Anemia 07/29/2013  . Asthma    childhood asthma  . Benign essential HTN   . Benign prostatic hypertrophy    several prostate biopsies neg for cancer.   . Bilateral hip pain   . Cancer ([place])   . CHF (congestive heart failure) ([place])   . Chronic anticoagulation 07/22/2013  . Complication of anesthesia    MORPHINE CAUSES HALLUCINATIONS  . CVA (cerebral vascular accident) ([place]) 06/2016  . Depression   . Diastolic congestive heart failure ([place]) 07/16/2013  . Dysphagia, post-stroke   . Embolic cerebral infarction ([place]) 06/21/2016  . FH: colonic polyps 2010  . Gout   . History of kidney stones   . Hypertension   . Kidney cysts   . Morbid obesity ([place])   . Numbness    feet / legs  . PAF (paroxysmal atrial fibrillation) with RVR 07/29/2013  . Pericarditis 2015   s/p window  . Recurrent pulmonary emboli ([place]) 06/13/2015  . Sleep apnea    USES C PAP  . Stroke syndrome   . 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   BP (!) 153/78   Pulse 69   Temp 98.6 F (37 C)   Ht 6' (1.829 m)   Wt (!) 390 lb 6.4 oz (177.1 kg)   SpO2 97%   BMI 52.95 kg/m   Opioid Risk Score:   Fall Risk Score:  `1  Depression screen PHQ 2/9  Depression screen [facility] 2/9 10/27/2019 10/27/2019 11/14/2018 05/16/2018 10/01/2017 07/28/2016  Decreased Interest 0 0 1 1 0 0  Down, Depressed, Hopeless 0 0 1 1 0 0  PHQ - 2 Score 0 0 2 2 0 0  Altered sleeping - - 1 1 - 0  Tired, decreased energy - - 2 2 - 1  Change in appetite - - 1 0 - 0  Feeling bad or failure about yourself  - - 1 1 - 0  Trouble concentrating - - 1 0 - 1  Moving slowly or fidgety/restless - - 0 0 - 0  Suicidal thoughts - - 0 0 - -  PHQ-9 Score - - 8 6 - 2  Difficult doing work/chores - - Somewhat difficult Not difficult at all - Not difficult at all  Some recent data might be hidden    Review of Systems  Musculoskeletal:       Knee pain  All other systems reviewed and are negative.       Objective:   Physical Exam Vitals and nursing note reviewed.  Constitutional:      Appearance: He is obese.  HENT:     Head: Normocephalic and atraumatic.  Eyes:     Extraocular Movements: Extraocular movements intact.     Conjunctiva/sclera: Conjunctivae normal.     Pupils: Pupils are equal, round, and reactive to light.  Musculoskeletal:        General: No tenderness.     Comments: No tenderness to palpation lumbar paraspinals he has limited range of motion of approximately 50% lumbar flexion and 25% lumbar extension.  There is more pain with extension than with flexion  Skin:    General: Skin is warm and dry.  Neurological:     Mental Status: He is alert and oriented to person, place, and time.     Comments: Patient is ambulating with a cane.  Wide-based support.  No foot drag or knee instability. His motor strength is 5/5 bilateral hip flexor knee extensor ankle dorsiflexors.  Psychiatric:        Mood and Affect: Mood normal.        Behavior: Behavior normal.        Thought Content: Thought content normal.           Assessment & Plan:  #1.  History of lumbar spondylosis without myelopathy.  His functional status has remained constant since last visit.  Urine toxicology taken earlier this year was appropriate.  Continue tramadol 50 mg 3 times daily.  Would not increase the dose given that he is on fairly high-dose sertraline.  We discussed serotonin syndrome. We discussed patient would be a candidate for repeat lumbar medial branch blocks plus minus radiofrequency neurotomies of L3-4-5.  The patient does not wish to pursue this at the current time. 2.  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  In addition his morbid obesity and congestive heart failure are limiting his ambulation distance.  Finally he has chronic pain in the lumbar spine which is also limiting.  We  discussed weight loss.  I printed out the bariatric stage III diet.  He may need to follow-up with his primary or bariatric surgery service for further guidance.  May need dietary consult. Physical medicine rehab follow-up in 6 months

## 2020-02-25 ENCOUNTER — Telehealth: Payer: Medicare Other

## 2020-02-25 NOTE — Chronic Care Management (AMB) (Deleted)
Chronic Care Management Pharmacy  Name: [name]  MRN: [id number] DOB: [birth year]   Chief Complaint/ HPI  [name],  74 y.o. , male presents for their Follow-Up CCM visit with the clinical pharmacist via telephone due to COVID-19 Pandemic.  PCP : Janith Lima, MD  Their chronic conditions include: Hypertension, Hyperlipidemia, Diabetes, Atrial Fibrillation, Heart Failure, GERD, Depression, Anxiety and embolic CVA, recurrent PE  Pt reports he had a stroke in 2018, ever since balance is off. Walks with cane/walker - LLE pain and problems. Lower back pain (Dr Ella Bodo - lumbar injections) He also had Bariatric band surgery Jan 2015. Lost 100 lbs in 2014-2015, had many health issues later in 2015.   Office Visits: 10/17/19 Dr Ronnald Ramp OV: conditions stable, no med changes  02/07/19 Dr Jenny Reichmann: acute calf pain x 3 days, r/o DVT, likely MSK. Rx'd Norco prn.  11/14/18 Dr Ronnald Ramp OV: acute foot pain, likely plantar fasciitis, referred to podiatry. A1c up to 6.5, new onset DM, no meds indicated yet.  Consult Visit: 02/24/20 Dr Letta Pate (phys med/rehab): continue tramadol 50 mg TID, will not increase dose due to concomitant high dose sertraline.  11/04/19 Dr Adele Schilder (psych): f/u for depression, currently stable, no med changes.   08/28/19 Dr Letta Pate (phys med/rehab): 3-4/10 pain. Pt has recent fall, no significant injury. No longer taking hydrocodone. No med changes.  06/03/19 Dr Debara Pickett (cardiology): persistent DOE, 2/2 morbid obesity and diastolic dysfunction. Afib controlled. On CPAP for OSA. 05/29/19 [facility] ENT: MBS normal, voice and swallowing stable. 05/12/19 Dr Adele Schilder (psych): pt stable, no med changes. 05/04/19 sleep study: OSA uncontrolled w/ current CPAP. Changed to BiPAP.  04/17/19 Dr Annamaria Boots (pulmonary): CPAP not doing enough, scheduled BiPAP sleep study. 10/2018: ED visit for back pain.   Allergies  Allergen Reactions  . Morphine Nausea And Vomiting and Other (See Comments)     Severe hallucinations  . Codeine Nausea Only  . Penicillins Other (See Comments)    Passed out after injection Did it involve swelling of the face/tongue/throat, SOB, or low BP? Unknown Did it involve sudden or severe rash/hives, skin peeling, or any reaction on the inside of your mouth or nose? No Did you need to seek medical attention at a hospital or doctor's office? Yes When did it last happen?teenager If all above answers are "NO", may proceed with cephalosporin use.  [name] Propoxyphene N-Acetaminophen Nausea Only    Medications: Outpatient Encounter Medications as of 02/25/2020  Medication Sig  . atorvastatin (LIPITOR) 80 MG tablet Take 1 tablet by mouth once daily  . [employer] MELATONIN GUMMIES PO Take by mouth.  [name] Sodium (DSS) 100 MG CAPS Take 100 mg by mouth every other day.  [name] ELIQUIS 5 MG TABS tablet Take 1 tablet by mouth twice daily  . finasteride (PROSCAR) 5 MG tablet Take 1 tablet (5 mg total) by mouth daily.  . flecainide (TAMBOCOR) 50 MG tablet Take 1 tablet (50 mg total) by mouth every 12 (twelve) hours. OV NEEED  . loratadine (CLARITIN) 10 MG tablet Take 10 mg by mouth daily.  [name] LORazepam (ATIVAN) 0.5 MG tablet Take 1 tablet (0.5 mg total) by mouth at bedtime.  . montelukast (SINGULAIR) 10 MG tablet TAKE 1 TABLET BY MOUTH AT BEDTIME  . Multiple Vitamin (MULTIVITAMIN WITH MINERALS) TABS tablet Take 1 tablet by mouth daily.  [name] OVER THE COUNTER MEDICATION Take 1 tablet by mouth daily. Multi vitamin with iron  . pantoprazole (PROTONIX) 40 MG tablet Take 1 tablet  by mouth once daily  . potassium chloride SA (KLOR-CON) 20 MEQ tablet TAKE 1 TABLET BY MOUTH TWICE DAILY **AFTERNOON  AND  BEDTIME** (Patient taking differently: One tablet daily)  . rOPINIRole (REQUIP) 0.5 MG tablet TAKE 1 TABLET BY MOUTH THREE TIMES DAILY  . sertraline (ZOLOFT) 100 MG tablet Take 1.5 tablets (150 mg total) by mouth at bedtime.  . torsemide (DEMADEX) 20 MG tablet TAKE 3 TABLETS BY MOUTH ONCE  DAILY. KEEP APPOINTMENT AS SCHEDULED  . traMADol (ULTRAM) 50 MG tablet Take 1 tablet (50 mg total) by mouth every 8 (eight) hours as needed.  . [DISCONTINUED] apixaban (ELIQUIS) 5 MG TABS tablet Take 1 tablet (5 mg total) by mouth 2 (two) times daily.  . [DISCONTINUED] atorvastatin (LIPITOR) 80 MG tablet Take 1 tablet (80 mg total) by mouth daily.  . [DISCONTINUED] atorvastatin (LIPITOR) 80 MG tablet Take 1 tablet by mouth once daily  . [DISCONTINUED] montelukast (SINGULAIR) 10 MG tablet Take 1 tablet (10 mg total) by mouth at bedtime.  . [DISCONTINUED] pantoprazole (PROTONIX) 40 MG tablet Take 1 tablet by mouth once daily   No facility-administered encounter medications on file as of 02/25/2020.     Current Diagnosis/Assessment:    Goals Addressed   None    AFIB   Hx GI bleed 07/2013.  Patient is currently rhythm controlled. Pulse Readings from Last 3 Encounters:  02/24/20 69  12/04/19 85  10/27/19 72   CHA2DS2-VASc Score = 5  The patient's score is based upon: CHF History: 0 HTN History: 1 Diabetes History: 1 Stroke History: 2 Vascular Disease History: 0  Patient has failed these meds in past: n/a Patient is currently controlled on the following medications:   flecainide 50 mg q12h,   Eliquis 5 mg BID  We discussed: Benefits of Eliquis for stroke prevention; Bleeding risks; avoid NSAIDs;   Plan  Continue current medications   Heart Failure / Hypertension   Type: Diastolic  Last ejection fraction: 10/10/2018 60-65%  BP goal is:  <130/80 BP Readings from Last 3 Encounters:  02/24/20 (!) 153/78  12/04/19 (!) 141/77  10/27/19 (!) 140/64   Patient has failed these meds in past: amlodipine, furosemide, metoprolol tartrate,   Patient is currently controlled on the following medications:   torsemide 20 mg x 3 daily,   potassium 40 mEq HS (20 meq x 2)  We discussed: takes loop diuretic 4-5 days per week, he does not take it on days he has to go out because  it causes frequent urination; pt reports SOB but unrelated to fluid per cardiologist - obesity contributing.  Plan  Continue current medications    Diabetes   A1c goal < 7%  Recent Relevant Labs: Lab Results  Component Value Date/Time   HGBA1C 6.4 (H) 10/27/2019 02:00 PM   HGBA1C 6.5 (A) 11/14/2018 03:02 PM   HGBA1C 6.0 05/16/2018 04:16 PM   MICROALBUR 7.1 10/27/2019 02:00 PM   MICROALBUR 1.1 12/12/2006 08:55 AM    No medication indicated  We discussed: diet and exercise extensively;  Discussed importance of lifestyle modifications if he wants to avoid more medications. Pt reports he was able to lose ~100 lbs before and after bariatric surgery while on calorie restrictive diet.   Plan  Continue control with diet and exercise  Hyperlipidemia/ASCVD   LDL goal < 70  Lipid Panel     Component Value Date/Time   CHOL 132 10/27/2019 1400   TRIG 191 (H) 10/27/2019 1400   HDL 29 (L) 10/27/2019 1400  CHOLHDL 4.6 10/27/2019 1400   VLDL 40.6 (H) 05/16/2018 1616   LDLCALC 74 10/27/2019 1400   LDLDIRECT 91.0 05/16/2018 1616   The ASCVD Risk score (Goff DC Jr., et al., 2013) failed to calculate for the following reasons:   The patient has a prior MI or stroke diagnosis (embolic CVA)   Patient has failed these meds in past: n/a Patient is currently controlled on the following medications:   atorvastatin 80 mg daily AM  We discussed:  diet and exercise extensively; no antiplatelet due to Eliquis  Plan  Continue current medications and control with diet and exercise    Depression/anxiety   Depression screen [facility] 2/9 10/27/2019 10/27/2019 11/14/2018  Decreased Interest 0 0 1  Down, Depressed, Hopeless 0 0 1  PHQ - 2 Score 0 0 2  Altered sleeping - - 1  Tired, decreased energy - - 2  Change in appetite - - 1  Feeling bad or failure about yourself  - - 1  Trouble concentrating - - 1  Moving slowly or fidgety/restless - - 0  Suicidal thoughts - - 0  PHQ-9 Score - - 8   Difficult doing work/chores - - Somewhat difficult  Some recent data might be hidden   Patient has failed these meds in past: n/a Patient is currently controlled on the following medications:   sertraline 150 mg HS,   lorazepam 0.5 mg HS  We discussed: Pt reports good control with medications as prescribed, denies issues currently  Plan  Continue current medications   Pain   Patient has failed these meds in past: hydrocodone/APAP Patient is currently controlled on the following medications:   tramadol 50 mg q8h prn (typically BID)  lidocaine 5% patch  We discussed:  Can't take NSAIDs due to Eliquis, tramadol helps a lot. He does endorse significant back pain making it difficult to exercise or even stand for short periods of time. Encouraged weight loss to help with pain as well.  Plan  Continue current medications  Medication Management   Pt uses [facility] for all medications Uses pill box Pt endorses 100% compliance  We discussed: pt is satisfied with current medication management strategy. Medications are mostly affordable except for Eliquis, which we will pursue PAP - Pt reports he lost PAP paperwork so I will resend.  Plan  Continue current medication management strategy    Follow up: 3 month phone visit  Charlene Brooke, PharmD, [facility] Clinical Pharmacist [facility] at [facility] [phone]

## 2020-02-29 ENCOUNTER — Other Ambulatory Visit: Payer: Self-pay | Admitting: Internal Medicine

## 2020-02-29 DIAGNOSIS — I480 Paroxysmal atrial fibrillation: Secondary | ICD-10-CM

## 2020-03-02 DIAGNOSIS — Z23 Encounter for immunization: Secondary | ICD-10-CM | POA: Diagnosis not present

## 2020-03-15 ENCOUNTER — Telehealth: Payer: Self-pay | Admitting: *Deleted

## 2020-03-15 NOTE — Telephone Encounter (Signed)
[name] reports he is having issues with knee pain and swelling and it is too painful to walk on. I have advised him that Dr Letta Pate is out of the office until after Christmas.  We can only advise him to keep ice on it 20-30 min at a time, and I have suggested he see his orthopedic doctor Alvan Dame) since he has a hx of knee replacements in both.  He will follow up with Dr Alvan Dame.

## 2020-03-16 ENCOUNTER — Telehealth: Payer: Self-pay | Admitting: Pharmacist

## 2020-03-16 DIAGNOSIS — M25562 Pain in left knee: Secondary | ICD-10-CM | POA: Diagnosis not present

## 2020-03-16 DIAGNOSIS — M25561 Pain in right knee: Secondary | ICD-10-CM | POA: Diagnosis not present

## 2020-03-16 NOTE — Progress Notes (Addendum)
Chronic Care Management Pharmacy Assistant   Name: [name]  MRN: [id number] DOB: [birth year]  Reason for Encounter: General Adherence Call   PCP : Janith Lima, MD  Allergies:   Allergies  Allergen Reactions   Morphine Nausea And Vomiting and Other (See Comments)    Severe hallucinations   Codeine Nausea Only   Penicillins Other (See Comments)    Passed out after injection Did it involve swelling of the face/tongue/throat, SOB, or low BP? Unknown Did it involve sudden or severe rash/hives, skin peeling, or any reaction on the inside of your mouth or nose? No Did you need to seek medical attention at a hospital or doctor's office? Yes When did it last happen?    teenager   If all above answers are "NO", may proceed with cephalosporin use.   Propoxyphene N-Acetaminophen Nausea Only    Medications: Outpatient Encounter Medications as of 03/16/2020  Medication Sig   atorvastatin (LIPITOR) 80 MG tablet Take 1 tablet by mouth once daily   [employer] MELATONIN GUMMIES PO Take by mouth.   Docusate Sodium (DSS) 100 MG CAPS Take 100 mg by mouth every other day.   ELIQUIS 5 MG TABS tablet Take 1 tablet by mouth twice daily   finasteride (PROSCAR) 5 MG tablet Take 1 tablet (5 mg total) by mouth daily.   flecainide (TAMBOCOR) 50 MG tablet Take 1 tablet (50 mg total) by mouth every 12 (twelve) hours. OV NEEED   loratadine (CLARITIN) 10 MG tablet Take 10 mg by mouth daily.   LORazepam (ATIVAN) 0.5 MG tablet Take 1 tablet (0.5 mg total) by mouth at bedtime.   montelukast (SINGULAIR) 10 MG tablet TAKE 1 TABLET BY MOUTH AT BEDTIME   Multiple Vitamin (MULTIVITAMIN WITH MINERALS) TABS tablet Take 1 tablet by mouth daily.   OVER THE COUNTER MEDICATION Take 1 tablet by mouth daily. Multi vitamin with iron   pantoprazole (PROTONIX) 40 MG tablet Take 1 tablet by mouth once daily   potassium chloride SA (KLOR-CON) 20 MEQ tablet TAKE 1 TABLET BY MOUTH TWICE DAILY **AFTERNOON  AND  BEDTIME**  (Patient taking differently: One tablet daily)   rOPINIRole (REQUIP) 0.5 MG tablet TAKE 1 TABLET BY MOUTH THREE TIMES DAILY   sertraline (ZOLOFT) 100 MG tablet Take 1.5 tablets (150 mg total) by mouth at bedtime.   torsemide (DEMADEX) 20 MG tablet TAKE 3 TABLETS BY MOUTH ONCE DAILY. KEEP APPOINTMENT AS SCHEDULED   traMADol (ULTRAM) 50 MG tablet Take 1 tablet (50 mg total) by mouth every 8 (eight) hours as needed.   [DISCONTINUED] apixaban (ELIQUIS) 5 MG TABS tablet Take 1 tablet (5 mg total) by mouth 2 (two) times daily.   [DISCONTINUED] atorvastatin (LIPITOR) 80 MG tablet Take 1 tablet (80 mg total) by mouth daily.   [DISCONTINUED] atorvastatin (LIPITOR) 80 MG tablet Take 1 tablet by mouth once daily   [DISCONTINUED] ELIQUIS 5 MG TABS tablet Take 1 tablet by mouth twice daily   [DISCONTINUED] montelukast (SINGULAIR) 10 MG tablet Take 1 tablet (10 mg total) by mouth at bedtime.   [DISCONTINUED] pantoprazole (PROTONIX) 40 MG tablet Take 1 tablet by mouth once daily   No facility-administered encounter medications on file as of 03/16/2020.    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) 06/13/2015   GERD (gastroesophageal reflux disease) 06/13/2015   Basal cell carcinoma of skin 06/28/2014   Periodic limb movement sleep disorder 06/19/2014   PAF (paroxysmal atrial fibrillation) ([employer]) 07/29/2013   Chronic anticoagulation  07/22/2013   Gastric ulcer with hemorrhage 07/20/13 22/48/2500   Diastolic congestive heart failure ([place]) 07/16/2013   H/O laparoscopic adjustable gastric banding & hiatal hernia repair 04/08/13 04/23/2013   Morbid obesity ([place]) 08/09/2012   Peripheral neuropathy ([place]) 11/02/2009   Hyperlipidemia with target LDL less than 130 09/10/2008   GOUT 06/28/2006   BPH (benign prostatic hyperplasia) 06/28/2006    Goals Addressed   None     Follow-Up:  Pharmacist Review    A general Adherence call was made to [name] for a wellness call to see how he has been doing since his last visit with the clinical pharmacist Mendel Ryder. The patient states that he is short of breath and was winded even when he answered the phone. The patient states that he has had SOB since 2015 and has CHF. The patient states that he can walk from one end of the house to the other but would have to stop and catch his breath before walking back. He states that he was on his way to the doctors office because he is having trouble with his left knee hurting. The patient states that he is taking tramadol for the pain at least 2 tab a day but still no relief from the pain. The patient also mentioned that he does use a wheel chair to get around the house and other places. He is unable to do exercise but his wife and son believes that he should. The patient said that other than the sob and pain in his leg he is ok. I let the patient know that I would pass along the information to the clinical pharmacist Mendel Ryder.   Wendy Poet, Clinical Pharmacist Assistant [facility]

## 2020-05-04 ENCOUNTER — Telehealth: Payer: Self-pay | Admitting: Internal Medicine

## 2020-05-04 DIAGNOSIS — H1010 Acute atopic conjunctivitis, unspecified eye: Secondary | ICD-10-CM

## 2020-05-04 DIAGNOSIS — K2100 Gastro-esophageal reflux disease with esophagitis, without bleeding: Secondary | ICD-10-CM

## 2020-05-04 DIAGNOSIS — J309 Allergic rhinitis, unspecified: Secondary | ICD-10-CM

## 2020-05-04 MED ORDER — PANTOPRAZOLE SODIUM 40 MG PO TBEC: 40.0000 mg | DELAYED_RELEASE_TABLET | Freq: Every day | ORAL | 0 refills | Status: DC

## 2020-05-04 MED ORDER — MONTELUKAST SODIUM 10 MG PO TABS: 10.0000 mg | ORAL_TABLET | Freq: Every day | ORAL | 0 refills | Status: DC

## 2020-05-04 NOTE — Telephone Encounter (Signed)
Patient requesting refill for montelukast (SINGULAIR) 10 MG tablet and  pantoprazole (PROTONIX) 40 MG tablet   [facility] #[id number] - [place], [place]  Appointment made for 05/12/20

## 2020-05-06 ENCOUNTER — Encounter (HOSPITAL_COMMUNITY): Payer: Self-pay | Admitting: Psychiatry

## 2020-05-06 ENCOUNTER — Other Ambulatory Visit: Payer: Self-pay

## 2020-05-06 ENCOUNTER — Telehealth (INDEPENDENT_AMBULATORY_CARE_PROVIDER_SITE_OTHER): Payer: Medicare Other | Admitting: Psychiatry

## 2020-05-06 DIAGNOSIS — F419 Anxiety disorder, unspecified: Secondary | ICD-10-CM | POA: Diagnosis not present

## 2020-05-06 DIAGNOSIS — F330 Major depressive disorder, recurrent, mild: Secondary | ICD-10-CM | POA: Diagnosis not present

## 2020-05-06 MED ORDER — SERTRALINE HCL 100 MG PO TABS: 150.0000 mg | ORAL_TABLET | Freq: Every day | ORAL | 1 refills | Status: DC

## 2020-05-06 MED ORDER — LORAZEPAM 0.5 MG PO TABS: 0.5000 mg | ORAL_TABLET | Freq: Every day | ORAL | 1 refills | Status: DC

## 2020-05-06 NOTE — Progress Notes (Signed)
Virtual Visit via Telephone Note  I connected with [name] on 05/06/20 at  2:00 PM EST by telephone and verified that I am speaking with the correct person using two identifiers.  Location: Patient: Home Provider: Home Office   I discussed the limitations, risks, security and privacy concerns of performing an evaluation and management service by telephone and the availability of in person appointments. I also discussed with the patient that there may be a patient responsible charge related to this service. The patient expressed understanding and agreed to proceed.   History of Present Illness: Patient is evaluated by phone session.  He is on the phone by himself.  He is taking his medication every day.  He is using BiPAP for his sleep.  Some nights he is sleeping good but other nights he trouble with insomnia.  Patient told this is his chronic problem.  He endorsed due to his pain and weight he is less mobile.  He uses walker and cane and sometimes scooter when he go outside.  He does go to grocery stores on and off but try to stay at home because of pain in his back.  He is seen Dr. Dianah Field for pain management and getting spine injection which helps for some time.  He is sad because 2 of his cousin died in past few months because of lung issues.  He feels the current medicine is working because he is not as depressed and anxious.  He denies any crying spells.  He is hopeful but also realize that he is aging and having health issues.  He enjoys the company of his granddaughter.  He had a good support from his wife, son and daughter-in-law.  He had a question few months ago about Narcan that if he should keep.  He is not using any narcotic pain medication other than tramadol which he has cut down and takes only when he needed when is in severe pain.  We explained that if he is not taking narcotic pain medication every day and higher doses than he should not be worried because it is used for  opiate overdose.    Past Psychiatric History:Reviewed H/Oinpatientin 2058fordepression and having suicidal thoughts. TookRisperdal but d/cafter stroke in March 2018.   Psychiatric Specialty Exam: Physical Exam  Review of Systems  Weight (!) 390 lb (176.9 kg).There is no height or weight on file to calculate BMI.  General Appearance: NA  Eye Contact:  NA  Speech:  Slow  Volume:  Decreased  Mood:  Euthymic  Affect:  NA  Thought Process:  Goal Directed  Orientation:  Full (Time, Place, and Person)  Thought Content:  Logical  Suicidal Thoughts:  No  Homicidal Thoughts:  No  Memory:  Immediate;   Good Recent;   Fair Remote;   Fair  Judgement:  Intact  Insight:  Present  Psychomotor Activity:  NA  Concentration:  Concentration: Fair and Attention Span: Fair  Recall:  [employer] of Knowledge:  Good  Language:  Good  Akathisia:  No  Handed:  Right  AIMS (if indicated):     Assets:  Communication Skills Desire for Improvement Housing Social Support  ADL's:  Intact  Cognition:  WNL  Sleep:   ok with Bipap      Assessment and Plan: Major depressive disorder, recurrent.  Anxiety.  Patient is a stable on his current medication.  We talked about watching his calorie intake and healthy diet.  Patient was encouraged to start  walking if his pain permits.  Discussed other method to keep him engage in healthy lifestyle.  He like to keep his current medication.  I will continue Zoloft 150 mg daily and lorazepam 0.5 mg at bedtime.  Recommended to call us back if is any question or any concern.  Follow-up in 6 months.  Follow Up Instructions:    I discussed the assessment and treatment plan with the patient. The patient was provided an opportunity to ask questions and all were answered. The patient agreed with the plan and demonstrated an understanding of the instructions.   The patient was advised to call back or seek an in-person evaluation if the symptoms worsen or if the  condition fails to improve as anticipated.  I provided 19 minutes of non-face-to-face time during this encounter.   Kathlee Nations, MD

## 2020-05-12 ENCOUNTER — Encounter: Payer: Self-pay | Admitting: Internal Medicine

## 2020-05-12 ENCOUNTER — Ambulatory Visit (INDEPENDENT_AMBULATORY_CARE_PROVIDER_SITE_OTHER): Payer: Medicare Other | Admitting: Internal Medicine

## 2020-05-12 ENCOUNTER — Other Ambulatory Visit: Payer: Self-pay

## 2020-05-12 VITALS — BP 132/78 | HR 76 | Temp 98.4°F | Ht 72.0 in | Wt 391.6 lb

## 2020-05-12 DIAGNOSIS — I10 Essential (primary) hypertension: Secondary | ICD-10-CM

## 2020-05-12 DIAGNOSIS — E118 Type 2 diabetes mellitus with unspecified complications: Secondary | ICD-10-CM | POA: Diagnosis not present

## 2020-05-12 DIAGNOSIS — I480 Paroxysmal atrial fibrillation: Secondary | ICD-10-CM

## 2020-05-12 LAB — CBC WITH DIFFERENTIAL/PLATELET
Basophils Absolute: 0.1 10*3/uL (ref 0.0–0.1)
Basophils Relative: 0.7 % (ref 0.0–3.0)
Eosinophils Absolute: 0.1 10*3/uL (ref 0.0–0.7)
Eosinophils Relative: 1 % (ref 0.0–5.0)
HCT: 40.4 % (ref 39.0–52.0)
Hemoglobin: 13.7 g/dL (ref 13.0–17.0)
Lymphocytes Relative: 14.7 % (ref 12.0–46.0)
Lymphs Abs: 1.5 10*3/uL (ref 0.7–4.0)
MCHC: 33.9 g/dL (ref 30.0–36.0)
MCV: 87.2 fl (ref 78.0–100.0)
Monocytes Absolute: 1.1 10*3/uL — ABNORMAL HIGH (ref 0.1–1.0)
Monocytes Relative: 10.2 % (ref 3.0–12.0)
Neutro Abs: 7.7 10*3/uL (ref 1.4–7.7)
Neutrophils Relative %: 73.4 % (ref 43.0–77.0)
Platelets: 219 10*3/uL (ref 150.0–400.0)
RBC: 4.64 Mil/uL (ref 4.22–5.81)
RDW: 14.9 % (ref 11.5–15.5)
WBC: 10.5 10*3/uL (ref 4.0–10.5)

## 2020-05-12 LAB — BASIC METABOLIC PANEL
BUN: 25 mg/dL — ABNORMAL HIGH (ref 6–23)
CO2: 34 mEq/L — ABNORMAL HIGH (ref 19–32)
Calcium: 8.8 mg/dL (ref 8.4–10.5)
Chloride: 99 mEq/L (ref 96–112)
Creatinine, Ser: 1.21 mg/dL (ref 0.40–1.50)
GFR: 59.03 mL/min — ABNORMAL LOW (ref >60.00)
Glucose, Bld: 109 mg/dL — ABNORMAL HIGH (ref 70–99)
Potassium: 3.7 mEq/L (ref 3.5–5.1)
Sodium: 142 mEq/L (ref 135–145)

## 2020-05-12 LAB — HEMOGLOBIN A1C: Hgb A1c MFr Bld: 6.5 % (ref 4.6–6.5)

## 2020-05-12 NOTE — Progress Notes (Signed)
Subjective:  Patient ID: [name], male    DOB: 1945/11/01  Age: 75 y.o. MRN: [id number]  CC: Hypertension and Diabetes  This visit occurred during the SARS-CoV-2 public health emergency.  Safety protocols were in place, including screening questions prior to the visit, additional usage of staff PPE, and extensive cleaning of exam room while observing appropriate contact time as indicated for disinfecting solutions.    HPI KEIDEN DESKIN presents for f/up - He tells me his blood pressure and blood sugar have been well controlled.  He denies headache, blurred vision, chest pain, or shortness of breath.  He has an unchanged degree of lower extremity edema.  Outpatient Medications Prior to Visit  Medication Sig Dispense Refill  . atorvastatin (LIPITOR) 80 MG tablet Take 1 tablet by mouth once daily 90 tablet 1  . [facility] MELATONIN GUMMIES PO Take by mouth.    [name] Sodium (DSS) 100 MG CAPS Take 100 mg by mouth every other day.    [name] ELIQUIS 5 MG TABS tablet Take 1 tablet by mouth twice daily 180 tablet 0  . finasteride (PROSCAR) 5 MG tablet Take 1 tablet (5 mg total) by mouth daily. 30 tablet 1  . flecainide (TAMBOCOR) 50 MG tablet Take 1 tablet (50 mg total) by mouth every 12 (twelve) hours. OV NEEED 180 tablet 3  . loratadine (CLARITIN) 10 MG tablet Take 10 mg by mouth daily.    [name] LORazepam (ATIVAN) 0.5 MG tablet Take 1 tablet (0.5 mg total) by mouth at bedtime. 90 tablet 1  . montelukast (SINGULAIR) 10 MG tablet Take 1 tablet (10 mg total) by mouth at bedtime. 90 tablet 0  . Multiple Vitamin (MULTIVITAMIN WITH MINERALS) TABS tablet Take 1 tablet by mouth daily.    . pantoprazole (PROTONIX) 40 MG tablet Take 1 tablet (40 mg total) by mouth daily. 90 tablet 0  . potassium chloride SA (KLOR-CON) 20 MEQ tablet TAKE 1 TABLET BY MOUTH TWICE DAILY **AFTERNOON  AND  BEDTIME** (Patient taking differently: One tablet daily) 180 tablet 2  . rOPINIRole (REQUIP) 0.5 MG tablet TAKE 1 TABLET  BY MOUTH THREE TIMES DAILY 270 tablet 5  . sertraline (ZOLOFT) 100 MG tablet Take 1.5 tablets (150 mg total) by mouth at bedtime. 135 tablet 1  . torsemide (DEMADEX) 20 MG tablet TAKE 3 TABLETS BY MOUTH ONCE DAILY. KEEP APPOINTMENT AS SCHEDULED 90 tablet 11  . traMADol (ULTRAM) 50 MG tablet Take 1 tablet (50 mg total) by mouth every 8 (eight) hours as needed. 90 tablet 5   No facility-administered medications prior to visit.    ROS Review of Systems  Constitutional: Negative.  Negative for appetite change, diaphoresis, fatigue and unexpected weight change.  HENT: Negative.   Eyes: Negative for visual disturbance.  Respiratory: Negative for cough, chest tightness, shortness of breath and wheezing.   Cardiovascular: Negative for chest pain, palpitations and leg swelling.  Gastrointestinal: Negative for abdominal pain, constipation, diarrhea, nausea and vomiting.  Endocrine: Negative.  Negative for polydipsia, polyphagia and polyuria.  Genitourinary: Negative.   Musculoskeletal: Positive for arthralgias and back pain. Negative for myalgias.  Skin: Negative.  Negative for color change and pallor.  Neurological: Negative.  Negative for dizziness and weakness.  Hematological: Negative for adenopathy. Does not bruise/bleed easily.  Psychiatric/Behavioral: Negative.     Objective:  BP 132/78   Pulse 76   Temp 98.4 F (36.9 C) (Oral)   Ht 6' (1.829 m)   Wt (!) 391 lb 9.6 oz (  177.6 kg)   SpO2 96%   BMI 53.11 kg/m   BP Readings from Last 3 Encounters:  05/12/20 132/78  02/24/20 (!) 153/78  12/04/19 (!) 141/77    Wt Readings from Last 3 Encounters:  05/12/20 (!) 391 lb 9.6 oz (177.6 kg)  02/24/20 (!) 390 lb 6.4 oz (177.1 kg)  12/04/19 (!) 390 lb (176.9 kg)    Physical Exam Constitutional:      Appearance: He is obese.  HENT:     Nose: Nose normal.  Eyes:     General: No scleral icterus.    Conjunctiva/sclera: Conjunctivae normal.  Cardiovascular:     Rate and Rhythm:  Normal rate and regular rhythm.     Heart sounds: No murmur heard.   Pulmonary:     Effort: Pulmonary effort is normal.     Breath sounds: No stridor. No wheezing, rhonchi or rales.  Abdominal:     General: Abdomen is protuberant. Bowel sounds are normal. There is no distension.     Palpations: Abdomen is soft. There is no hepatomegaly, splenomegaly or mass.     Tenderness: There is no abdominal tenderness.  Musculoskeletal:        General: Normal range of motion.     Cervical back: Neck supple.     Right lower leg: 1+ Edema present.     Left lower leg: 1+ Edema present.  Lymphadenopathy:     Cervical: No cervical adenopathy.  Skin:    General: Skin is warm and dry.  Neurological:     General: No focal deficit present.     Mental Status: He is alert.  Psychiatric:        Mood and Affect: Mood normal.        Behavior: Behavior normal.     Lab Results  Component Value Date   WBC 10.5 05/12/2020   HGB 13.7 05/12/2020   HCT 40.4 05/12/2020   PLT 219.0 05/12/2020   GLUCOSE 109 (H) 05/12/2020   CHOL 132 10/27/2019   TRIG 191 (H) 10/27/2019   HDL 29 (L) 10/27/2019   LDLDIRECT 91.0 05/16/2018   LDLCALC 74 10/27/2019   ALT 18 10/27/2019   AST 21 10/27/2019   NA 142 05/12/2020   K 3.7 05/12/2020   CL 99 05/12/2020   CREATININE 1.21 05/12/2020   BUN 25 (H) 05/12/2020   CO2 34 (H) 05/12/2020   TSH 4.33 10/27/2019   PSA 0.68 12/24/2015   INR 1.09 06/19/2016   HGBA1C 6.5 05/12/2020   MICROALBUR 7.1 10/27/2019    No results found.  Assessment & Plan:   Drey was seen today for hypertension and diabetes.  Diagnoses and all orders for this visit:  Benign essential HTN- His blood pressure is adequately well controlled. -     CBC with Differential/Platelet; Future -     Basic metabolic panel; Future -     Basic metabolic panel -     CBC with Differential/Platelet  PAF (paroxysmal atrial fibrillation) ([place])- He is maintaining sinus rhythm.  Will continue  anticoagulation with the DOAC.  Type II diabetes mellitus with manifestations ([place])- His A1c is at 6.5%.  His blood sugar is adequately well controlled. -     Basic metabolic panel; Future -     Hemoglobin A1c; Future -     Hemoglobin A1c -     Basic metabolic panel   I am having Robson A. Platt "[name]" maintain his DSS, loratadine, finasteride, flecainide, torsemide, potassium chloride SA, rOPINIRole, multivitamin with  minerals, [facility] MELATONIN GUMMIES PO, atorvastatin, traMADol, Eliquis, montelukast, pantoprazole, sertraline, and LORazepam.  No orders of the defined types were placed in this encounter.    Follow-up: Return in about 6 months (around 11/09/2020).  [name], MD

## 2020-05-12 NOTE — Patient Instructions (Signed)
Type 2 Diabetes Mellitus, Diagnosis, Adult Type 2 diabetes (type 2 diabetes mellitus) is a long-term, or chronic, disease. In type 2 diabetes, one or both of these problems may be present:  The pancreas does not make enough of a hormone called insulin.  Cells in the body do not respond properly to insulin that the body makes (insulin resistance). Normally, insulin allows blood sugar (glucose) to enter cells in the body. The cells use glucose for energy. Insulin resistance or lack of insulin causes excess glucose to build up in the blood instead of going into cells. This causes high blood glucose (hyperglycemia).  What are the causes? The exact cause of type 2 diabetes is not known. What increases the risk? The following factors may make you more likely to develop this condition:  Having a family member with type 2 diabetes.  Being overweight or obese.  Being inactive (sedentary).  Having been diagnosed with insulin resistance.  Having a history of prediabetes, diabetes when you were pregnant (gestational diabetes), or polycystic ovary syndrome (PCOS). What are the signs or symptoms? In the early stage of this condition, you may not have symptoms. Symptoms develop slowly and may include:  Increased thirst or hunger.  Increased urination.  Unexplained weight loss.  Tiredness (fatigue) or weakness.  Vision changes, such as blurry vision.  Dark patches on the skin. How is this diagnosed? This condition is diagnosed based on your symptoms, your medical history, a physical exam, and your blood glucose level. Your blood glucose may be checked with one or more of the following blood tests:  A fasting blood glucose (FBG) test. You will not be allowed to eat (you will fast) for 8 hours or longer before a blood sample is taken.  A random blood glucose test. This test checks blood glucose at any time of day regardless of when you ate.  An A1C (hemoglobin A1C) blood test. This test  provides information about blood glucose levels over the previous 2-3 months.  An oral glucose tolerance test (OGTT). This test measures your blood glucose at two times: ? After fasting. This is your baseline blood glucose level. ? Two hours after drinking a beverage that contains glucose. You may be diagnosed with type 2 diabetes if:  Your fasting blood glucose level is 126 mg/dL (7.0 mmol/L) or higher.  Your random blood glucose level is 200 mg/dL (11.1 mmol/L) or higher.  Your A1C level is 6.5% or higher.  Your oral glucose tolerance test result is higher than 200 mg/dL (11.1 mmol/L). These blood tests may be repeated to confirm your diagnosis.   How is this treated? Your treatment may be managed by a specialist called an endocrinologist. Type 2 diabetes may be treated by following instructions from your health care provider about:  Making dietary and lifestyle changes. These may include: ? Following a personalized nutrition plan that is developed by a registered dietitian. ? Exercising regularly. ? Finding ways to manage stress.  Checking your blood glucose level as often as told.  Taking diabetes medicines or insulin daily. This helps to keep your blood glucose levels in the healthy range.  Taking medicines to help prevent complications from diabetes. Medicines may include: ? Aspirin. ? Medicine to lower cholesterol. ? Medicine to control blood pressure. Your health care provider will set treatment goals for you. Your goals will be based on your age, other medical conditions you have, and how you respond to diabetes treatment. Generally, the goal of treatment is to maintain the   following blood glucose levels:  Before meals: 80-130 mg/dL (4.4-7.2 mmol/L).  After meals: below 180 mg/dL (10 mmol/L).  A1C level: less than 7%. Follow these instructions at home: Questions to ask your health care provider Consider asking the following questions:  Should I meet with a certified  diabetes care and education specialist?  What diabetes medicines do I need, and when should I take them?  What equipment will I need to manage my diabetes at home?  How often do I need to check my blood glucose?  Where can I find a support group for people with diabetes?  What number can I call if I have questions?  When is my next appointment? General instructions  Take over-the-counter and prescription medicines only as told by your health care provider.  Keep all follow-up visits as told by your health care provider. This is important. Where to find more information  American Diabetes Association (ADA): www.diabetes.org  American Association of Diabetes Care and Education Specialists (ADCES): www.diabeteseducator.org  International Diabetes Federation (IDF): www.idf.org Contact a health care provider if:  Your blood glucose is at or above 240 mg/dL (13.3 mmol/L) for 2 days in a row.  You have been sick or have had a fever for 2 days or longer, and you are not getting better.  You have any of the following problems for more than 6 hours: ? You cannot eat or drink. ? You have nausea and vomiting. ? You have diarrhea. Get help right away if:  You have severe hypoglycemia. This means your blood glucose is lower than 54 mg/dL (3.0 mmol/L).  You become confused or you have trouble thinking clearly.  You have difficulty breathing.  You have moderate or large ketone levels in your urine. These symptoms may represent a serious problem that is an emergency. Do not wait to see if the symptoms will go away. Get medical help right away. Call your local emergency services (911 in the [place] Do not drive yourself to the hospital. Summary  Type 2 diabetes (type 2 diabetes mellitus) is a long-term, or chronic, disease. In type 2 diabetes, the pancreas does not make enough of a hormone called insulin, or cells in the body do not respond properly to insulin that the body makes (insulin  resistance).  This condition is treated by making dietary and lifestyle changes and taking diabetes medicines or insulin.  Your health care provider will set treatment goals for you. Your goals will be based on your age, other medical conditions you have, and how you respond to diabetes treatment.  Keep all follow-up visits as told by your health care provider. This is important. This information is not intended to replace advice given to you by your health care provider. Make sure you discuss any questions you have with your health care provider. Document Revised: 10/15/2019 Document Reviewed: 10/15/2019 Elsevier Patient Education  2021 [employer]  

## 2020-06-03 DIAGNOSIS — R490 Dysphonia: Secondary | ICD-10-CM | POA: Diagnosis not present

## 2020-06-03 DIAGNOSIS — R1312 Dysphagia, oropharyngeal phase: Secondary | ICD-10-CM | POA: Diagnosis not present

## 2020-06-03 DIAGNOSIS — Z87891 Personal history of nicotine dependence: Secondary | ICD-10-CM | POA: Diagnosis not present

## 2020-06-03 DIAGNOSIS — J3801 Paralysis of vocal cords and larynx, unilateral: Secondary | ICD-10-CM | POA: Diagnosis not present

## 2020-06-03 DIAGNOSIS — R1310 Dysphagia, unspecified: Secondary | ICD-10-CM | POA: Diagnosis not present

## 2020-06-03 DIAGNOSIS — Z8673 Personal history of transient ischemic attack (TIA), and cerebral infarction without residual deficits: Secondary | ICD-10-CM | POA: Diagnosis not present

## 2020-06-10 ENCOUNTER — Other Ambulatory Visit: Payer: Self-pay

## 2020-06-10 ENCOUNTER — Ambulatory Visit (INDEPENDENT_AMBULATORY_CARE_PROVIDER_SITE_OTHER): Payer: Medicare Other | Admitting: Internal Medicine

## 2020-06-10 VITALS — BP 140/88 | HR 80 | Ht 72.0 in | Wt 390.0 lb

## 2020-06-10 DIAGNOSIS — I480 Paroxysmal atrial fibrillation: Secondary | ICD-10-CM | POA: Diagnosis not present

## 2020-06-10 DIAGNOSIS — I5032 Chronic diastolic (congestive) heart failure: Secondary | ICD-10-CM

## 2020-06-10 DIAGNOSIS — I10 Essential (primary) hypertension: Secondary | ICD-10-CM

## 2020-06-10 DIAGNOSIS — R0600 Dyspnea, unspecified: Secondary | ICD-10-CM | POA: Diagnosis not present

## 2020-06-10 DIAGNOSIS — R0609 Other forms of dyspnea: Secondary | ICD-10-CM

## 2020-06-10 MED ORDER — TORSEMIDE 20 MG PO TABS: ORAL_TABLET | ORAL | 3 refills | Status: DC

## 2020-06-10 MED ORDER — POTASSIUM CHLORIDE CRYS ER 20 MEQ PO TBCR: EXTENDED_RELEASE_TABLET | ORAL | 3 refills | Status: DC

## 2020-06-10 MED ORDER — FLECAINIDE ACETATE 50 MG PO TABS: 50.0000 mg | ORAL_TABLET | Freq: Two times a day (BID) | ORAL | 3 refills | Status: DC

## 2020-06-10 NOTE — Progress Notes (Signed)
OFFICE NOTE  Chief Complaint:  Follow-up dyspnea  Primary Care Physician: Janith Lima, MD  HPI:  [name] is a 75 y/o male with a history of bariatric surgery, one year ago, lost about 85 pounds before and after surgery. Also has a history of hypertension and sleep apnea. He has been admitted twice in the last month with an unusual presentation with multiple medical problems, including pulmonary embolus, moderate to large pericardial effusion, new onset a-fib and GI bleed. His PE was diagnosed by his PCP through outpatient w/u and he was placed on Xarelto. His first hospital admission was from 4/15-4/24. At that time his CC was chest pain. W/u suggested a diagnosis of pericarditis. An Echocardiogram was done, which showed moderate to large pericardial effusion. Meanwhile the night of 4/16 he went in to afib with RVR, probably secondary to PE and was transferred to step down and started on cardizem drip. He converted to sinus the same night , cardizem discontinued and he was resumed on metoprolol. CT surgery was consulted for pericardial window. He underwent pericardial window on 4/17 and hemorrhagic fluid was drained and was sent for analysis. Gram stain and cultures were negative. He was also started on colchicine for his pericarditis. The evening of 4/20 pt went back in to afib with RVR and had bloody bowel movement. His xarelto was discontinued and he was put on cardizem gtt. Over the next 24 hours his hemoglobin dropped to 7.8 and he received 2 units of prbc transfusion. His repeat H&H improved. Dr Henrene Pastor was consulted from GI on 4/20 and he underwent endoscopy on 4/21 . He was found to have superficial ulcers in the stomach, underwent biopsy of the area. He was recommended to continue on PPI twice daily for 2 weeks and resume xarelto. He was discharged home then returned 3 days later on 07/28/13 with a complaint of increased fatigue and SOB. Repeat 2D echo demonstrated only minimal  residual effusion with normal RV and LV function. 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 He was admitted and underwent a repeat EGD which showed healing of ulcers, no bleed. He was continued on PO iron and a PPI. His Hgb at time of discharge was stable at 9.0. He was discharged home. Cardiology had recommended an OP stress test. This was done 08/03/13 and showed normal myocardial perfusion and normal LV EF of 58%.  He returns today and has reported a marked improvement in his shortness of breath. He is able to do most activities without any difficulty. He continues on Eliquis for his pulmonary embolus and for stroke prevention. He is on flecainide for antiarrhythmic therapy. He denies any chest pain. His only complaint is fatigue. He reports some fatigue and notices it worse as he has been working on refurbishing a house that his mother used to own to sell. He reports his sleep is good at night however he does have difficulty falling asleep which may be playing a role in his fatigue.  [name] returns today for follow-up. He reports no significant difference in his symptoms. He gets some mild shortness of breath with exercise which she does very infrequently. He also reports leg swelling which is persistent. Weight has been fairly stable although could stand to go down. We talked about ways he could work on weight loss. He does have a history of lap band in the past however he is not been able to lose anymore weight and in fact  has gained it back. He denies any recurrent palpitation or A. Fib that he is aware of. He continues on flecainide which he is tolerating without any difficulty.  04/28/2016  [name] was seen back today in follow-up. He continues to complain of shortness of breath, persistent with exertion. Weight is now up from 337-350 pounds. He is celebrating the birth of this newest grandchild and wants to be more active and is frustrated that he is not able to do much activity  without shortness of breath. He is on flecainide without any recurrent atrial fibrillation. His last stress test was more than 2 years ago and he is due for repeat stress test.  05/23/2016  [name] returns today for follow-up. He is unfortunately gained another few pounds although he felt that this is related to stuff in his pockets. His stress test was negative for ischemia. It is noted that his blood pressure is high again today 168/89. He was previously elevated over 150. He does have a history of hypertension and was on Lotrel in the past but was taken off of that due to hypotension after significant weight loss that occurred after lap band surgery. Unfortunately he has gained back most of that weight and likely is hypertensive secondary to that.  11/22/2016  [name] was seen today in follow-up. He continues to have some progressive shortness of breath with exertion. 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 I wonder if this is playing a role in upper airway obstruction which could be causing his shortness of breath. Blood pressure appears to be better controlled today although he was taken off of blood pressure medications when he had a stroke. He was also previously on a number of inhalers including Symbicort and Breo Ellipta - these have also been discontinued.  02/20/2017  [name] returns today for follow-up.  I referred him to pulmonary for evaluation of his dyspnea.  He had no improvement with inhalers and underwent repeat pulmonary function testing by Dr. Lake Bells, which failed to reveal any significant obstructive pulmonary disease.  It was felt that he might be volume overloaded and he was switched from Lasix to torsemide.  It appears the dose was equivalent.  He is not diuresed anymore and says that he is not more short of breath however we will need to recheck his metabolic profile to ensure  he is not being over diuresed.  With regards to vocal cord dysfunction, he has follow-up at [facility] for evaluation of possible surgery.  It is not clear whether this will improve his shortness of breath not and I advised him to ask his surgeon whether the surgery would help more with his breathing or phonation.  01/09/2018  [name] is seen today in follow-up.  Overall he continues to have shortness of breath.  Weight is still significant issue.  He is essentially gained back all the weight that he lost after weight loss surgery.  He was recently seen by Rosaria Ferries, PA-C.  Who noted that his weighted maintained a stable level on torsemide 60 mg daily.  This was previously increased by his PCP.  He continues to keep off the edema but has had no significant change in his shortness of breath.  He is been working with Dr. Letta Pate in [facility], who has been giving him pain injections in his back.  Recently this is been helpful for him.  12/04/2019  [name] is seen today in follow-up.  He has persistent shortness of breath which worsened significantly recently when he was off of his torsemide for several days.  He then restarted it and ultimately got back to his baseline.  Weight is up about 8 pounds and has had continued issue losing it.  He is concerned about the cost of Eliquis and asked for some samples today.  He is taking the medicine.  He is not aware of any A. fib.  EKG does not show A. fib today.  He does have a history of LAP-BAND procedure but did not follow-up with general surgery after that.  06/10/2020  [name] returns today for follow-up.  Weight continues to be an issue and is not significantly lower.  He is now 390 pounds.  He is having dyspnea with minimal exertion.  He denies any recurrent A. fib.  He is on the flecainide.  He also takes Eliquis.  He says he is going to try to lose weight with dietary approaches.  I did offer the comprehensive weight management clinic but he  declined at this time.  PMHx:  Past Medical History:  Diagnosis Date   Anemia 07/29/2013   Asthma    childhood asthma   Benign essential HTN    Benign prostatic hypertrophy    several prostate biopsies neg for cancer.    Bilateral hip pain    Cancer (HCC)    CHF (congestive heart failure) (HCC)    Chronic anticoagulation 6/38/7564   Complication of anesthesia    MORPHINE CAUSES HALLUCINATIONS   CVA (cerebral vascular accident) ([place]) 06/2016   Depression    Diastolic congestive heart failure ([place]) 07/16/2013   Dysphagia, post-stroke    Embolic cerebral infarction ([place]) 06/21/2016   FH: colonic polyps 2010   Gout    History of kidney stones    Hypertension    Kidney cysts    Morbid obesity ([place])    Numbness    feet / legs   PAF (paroxysmal atrial fibrillation) with RVR 07/29/2013   Pericarditis 2015   s/p window   Recurrent pulmonary emboli ([place]) 06/13/2015   Sleep apnea    USES C PAP   Stroke syndrome    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    Past Surgical History:  Procedure Laterality Date   ARTHROSCOPY KNEE W/ DRILLING     BREATH TEK H PYLORI N/A 12/23/2012   Procedure: BREATH TEK H PYLORI;  Surgeon: Gayland Curry, MD;  Location: [facility];  Service: General;  Laterality: N/A;   [place] N/A 07/31/2013   Procedure: COLONOSCOPY;  Surgeon: Gatha Mayer, MD;  Location: [facility];  Service: Endoscopy;  Laterality: N/A;   ESOPHAGOGASTRODUODENOSCOPY N/A 07/22/2013   Procedure: ESOPHAGOGASTRODUODENOSCOPY (EGD);  Surgeon: Irene Shipper, MD;  Location[facility] ENDOSCOPY;  Service: Endoscopy;  Laterality: N/A;   ESOPHAGOGASTRODUODENOSCOPY N/A 07/31/2013   Procedure: ESOPHAGOGASTRODUODENOSCOPY (EGD);  Surgeon: Gatha Mayer, MD;  Location: [facility] ENDOSCOPY;  Service: Endoscopy;  Laterality: N/A;   INTRAOPERATIVE TRANSESOPHAGEAL ECHOCARDIOGRAM N/A 07/18/2013   Procedure: INTRAOPERATIVE TRANSESOPHAGEAL ECHOCARDIOGRAM;  Surgeon:  Grace Isaac, MD;  Location: [facility];  Service: Open Heart Surgery;  Laterality: N/A;   LAPAROSCOPIC GASTRIC BANDING WITH HIATAL HERNIA REPAIR N/A 04/08/2013   Procedure: LAPAROSCOPIC GASTRIC BANDING WITH possible  HIATAL HERNIA REPAIR;  Surgeon: Gayland Curry, MD;  Location: [facility] ORS;  Service: General;  Laterality: N/A;   LITHOTRIPSY     renal calculi  SUBXYPHOID PERICARDIAL WINDOW N/A 07/18/2013   Procedure: SUBXYPHOID PERICARDIAL WINDOW;  Surgeon: Grace Isaac, MD;  Location: [facility] OR;  Service: Thoracic;  Laterality: N/A;   TONSILLECTOMY     total knee replacment     bilat   trigger finger repair     also lue, cts surgically repaired   WISDOM TOOTH EXTRACTION      FAMHx:  Family History  Problem Relation Age of Onset   Depression Mother    Hypertension Mother    Dementia Mother    Heart disease Father        MI   Depression Brother    Stroke Paternal Aunt        CVA   Diabetes Paternal Uncle    Heart disease Paternal Uncle        MI   Heart disease Paternal Grandmother        MI   Diabetes Cousin        PATERNAL    SOCHx:   reports that he quit smoking about 45 years ago. His smoking use included cigarettes. He started smoking about 59 years ago. He has a 2.60 pack-year smoking history. He has never used smokeless tobacco. He reports that he does not drink alcohol and does not use drugs.  ALLERGIES:  Allergies  Allergen Reactions   Morphine Nausea And Vomiting and Other (See Comments)    Severe hallucinations   Codeine Nausea Only   Penicillins Other (See Comments)    Passed out after injection Did it involve swelling of the face/tongue/throat, SOB, or low BP? Unknown Did it involve sudden or severe rash/hives, skin peeling, or any reaction on the inside of your mouth or nose? No Did you need to seek medical attention at a hospital or doctor's office? Yes When did it last happen?teenager If all above answers are NO, may proceed  with cephalosporin use.   Propoxyphene N-Acetaminophen Nausea Only    ROS: Pertinent items noted in HPI and remainder of comprehensive ROS otherwise negative.  HOME MEDS: Current Outpatient Medications  Medication Sig Dispense Refill   atorvastatin (LIPITOR) 80 MG tablet Take 1 tablet by mouth once daily 90 tablet 1   [employer] MELATONIN GUMMIES PO Take by mouth.     Docusate Sodium (DSS) 100 MG CAPS Take 100 mg by mouth every other day.     ELIQUIS 5 MG TABS tablet Take 1 tablet by mouth twice daily 180 tablet 0   finasteride (PROSCAR) 5 MG tablet Take 1 tablet (5 mg total) by mouth daily. 30 tablet 1   flecainide (TAMBOCOR) 50 MG tablet Take 1 tablet (50 mg total) by mouth every 12 (twelve) hours. OV NEEED 180 tablet 3   loratadine (CLARITIN) 10 MG tablet Take 10 mg by mouth daily.     LORazepam (ATIVAN) 0.5 MG tablet Take 1 tablet (0.5 mg total) by mouth at bedtime. 90 tablet 1   montelukast (SINGULAIR) 10 MG tablet Take 1 tablet (10 mg total) by mouth at bedtime. 90 tablet 0   Multiple Vitamin (MULTIVITAMIN WITH MINERALS) TABS tablet Take 1 tablet by mouth daily.     pantoprazole (PROTONIX) 40 MG tablet Take 1 tablet (40 mg total) by mouth daily. 90 tablet 0   potassium chloride SA (KLOR-CON) 20 MEQ tablet TAKE 1 TABLET BY MOUTH TWICE DAILY **AFTERNOON  AND  BEDTIME** (Patient taking differently: One tablet daily) 180 tablet 2   rOPINIRole (REQUIP) 0.5 MG tablet TAKE 1 TABLET BY MOUTH THREE TIMES DAILY  270 tablet 5   sertraline (ZOLOFT) 100 MG tablet Take 1.5 tablets (150 mg total) by mouth at bedtime. 135 tablet 1   torsemide (DEMADEX) 20 MG tablet TAKE 3 TABLETS BY MOUTH ONCE DAILY. KEEP APPOINTMENT AS SCHEDULED 90 tablet 11   traMADol (ULTRAM) 50 MG tablet Take 1 tablet (50 mg total) by mouth every 8 (eight) hours as needed. 90 tablet 5   No current facility-administered medications for this visit.    LABS/IMAGING: No results found for this or any previous visit (from  the past 48 hour(s)). No results found.  VITALS: BP 140/88    Pulse 80    Ht 6' (1.829 m)    Wt (!) 390 lb (176.9 kg)    SpO2 97%    BMI 52.89 kg/m   EXAM: General appearance: alert, no distress and morbidly obese Neck: no carotid bruit, no JVD and thyroid not enlarged, symmetric, no tenderness/mass/nodules Lungs: diminished breath sounds bilaterally Heart: regular rate and rhythm Abdomen: morbidly obese Extremities: extremities normal, atraumatic, no cyanosis or edema Pulses: 2+ and symmetric Skin: pale, warm, diaphoretic Neurologic: Grossly normal Psych: Pleasant  EKG: Normal sinus rhythm at 80, RBBB-personally reviewed  ASSESSMENT: 1. Persistent dyspnea on exertion 2. Recent stroke with partial vocal cord paralysis 3. Paroxysmal atrial fibrillation on flecainide 4. Hyperlipidemia 5. Hypertension 6. Recent history of pericarditis/pericardial effusion status post pericardial window 7. Pulmonary embolus 8. Morbid obesity 9. Obstructive sleep apnea on CPAP 10. RBBB  PLAN: 1.   [name] continues to have shortness of breath I believe related to weight.  He denies any recurrent atrial fibrillation.  Blood pressure is decently controlled.  I offered weight management clinic but he declined at this time.  He is going to try to work on that on his own.  Follow-up with me in 6 months or sooner as necessary.  Pixie Casino, MD, [facility], [place] Director of the [facility] Lipid Disorders &  Cardiovascular Risk Reduction Clinic Attending Cardiologist  Direct Dial: [phone]   Fax: [phone]  Website:  [email or web address] Kiran Lapine 06/10/2020, 2:02 PM

## 2020-06-10 NOTE — Patient Instructions (Signed)

## 2020-06-16 ENCOUNTER — Telehealth: Payer: Self-pay | Admitting: Pharmacist

## 2020-06-16 NOTE — Telephone Encounter (Signed)
Patient has [facility] PDP insurance and reports copay for Eliquis is cost prohibitive at this time.  Reviewed application process for [employer] patient assistance program. Patient meets income/out of pocket spend criteria for the program. Patient will provide proof of income, out of pocket spend report, and will sign application. Will collaborate with prescriber Dr Ronnald Ramp for the provider portion of application. Once completed, application will be submitted via Fax   Patient assistance program Fax number: [phone]  Patient is bringing proof of income and out of pocket costs to office and will sign application at that time.  Patient requested Eliquis samples in the meantime. Patient will be provided with 3 boxes of Eliquis 5 mg (21-day supply) while we await patient assistance approval.  Charlton Haws, [facility]

## 2020-06-23 ENCOUNTER — Telehealth: Payer: Medicare Other

## 2020-07-18 NOTE — Progress Notes (Signed)
HPI male former smoker followed for OSA, Restless Legs,, complicated by A. fib, recurrent PE, bariatric surgery, major depression, dysphonia, midbrain CVA NPSG 10/25/05- severe OSA, AHI 79/ hr, CPAP to 16. Frequent limb jerks with sleep disturbance PFT 01/03/17-minimal obstruction without response to bronchodilator.  FVC 3.94/83%, FEV1 3.00/86%, ratio 2.76, TLC 96%, DLCO 78% Walk test on room air 04/08/2018-  Max HR 91, Min Sat 92% -----------------------------------------------------------------------------------   07/17/19- 75 year old male former smoker followed for OSA, Restless Legs, complicated by A. Fib, diastolic CHF recurrent PE, bariatric surgery, major depression, dysphonia, midbrain CVA, Recurrent PE,  BiPap 25/ 22/[employer] Download compliance 85%, AHI 3.3/ hr Requip 0.5 mg x 3 hs,  Body weight today 385 lbs Gets days and nights mixed up. Discussed sleep hygiene. Doesn't want "more pills". "Still trying to get used to BIPAP"., but meeting goals.  07/19/20- 75 year old male former smoker followed for OSA, Restless Legs, complicated by A. Fib, diastolic CHF recurrent PE, bariatric surgery/ Morbid Obesity, , major depression, dysphonia, midbrain CVA, Recurrent PE,  -Melatonin, Lorazepam, Requip, Zoloft,  BiPap 25/ 22/Adapt Downoad-compliance 97%, AHI 2.8/ hr Body weight today-398 lbs Covid vax-3 Phizer Flu vax-had -----Patient states that his mask does not fit very well but the other mask that he used to have was to big. Patient states that he has trouble going to sleep but when he does sleep he sleeps fine.  Mask uncomfortable, creeps up on face. Discussed sedative meds.  ROS-see HPI   + = positive Constitutional:    weight loss, night sweats, fevers, chills, fatigue, lassitude. HEENT:    headaches, difficulty swallowing, tooth/dental problems, sore throat,       sneezing, itching, ear ache,+ nasal congestion, post nasal drip, snoring CV:    chest pain, orthopnea, PND, swelling in  lower extremities, anasarca,                                                 dizziness, +palpitations Resp:   +shortness of breath with exertion or at rest.                productive cough,   non-productive cough, coughing up of blood.              change in color of mucus.  wheezing.   Skin:    rash or lesions. GI:  No-   heartburn, indigestion, abdominal pain, nausea, vomiting,  GU:  MS:   joint pain, stiffness, Neuro-     nothing unusual Psych:  change in mood or affect.  depression or anxiety.   memory loss.  OBJ- Physical Exam General- Alert, Oriented, Affect-appropriate, Distress- none acute, + morbidly obese, +wheelchair Skin- rash-none, lesions- none, excoriation- none Lymphadenopathy- none Head- atraumatic            Eyes- Gross vision intact, PERRLA, conjunctivae and secretions clear            Ears- +Hearing aid            Nose-  +moderate turbinate edema and mucus congestion, no-Septal dev, mucus, polyps,  erosion, perforation             Throat- Mallampati III , mucosa clear , drainage- none, tonsils- atrophic, + hoarse Neck- flexible , trachea midline, no stridor , thyroid nl, carotid no bruit Chest - symmetrical excursion , unlabored  Heart/CV- RRR , no murmur , no gallop  , no rub, nl s1 s2                           - JVD- none , edema- none, stasis changes- none, varices- none           Lung- clear to P&A, wheeze- none, cough- none , dullness-none, rub- none           Chest wall-  Abd-  Br/ Gen/ Rectal- Not done, not indicated Extrem- cyanosis- none, clubbing, none, atrophy- none, strength- nl Neuro- grossly intact to observation

## 2020-07-19 ENCOUNTER — Encounter: Payer: Self-pay | Admitting: Internal Medicine

## 2020-07-19 ENCOUNTER — Other Ambulatory Visit: Payer: Self-pay

## 2020-07-19 ENCOUNTER — Ambulatory Visit: Payer: Medicare Other | Admitting: Internal Medicine

## 2020-07-19 ENCOUNTER — Ambulatory Visit (INDEPENDENT_AMBULATORY_CARE_PROVIDER_SITE_OTHER): Payer: Medicare Other | Admitting: Internal Medicine

## 2020-07-19 VITALS — BP 138/72 | HR 83 | Temp 97.1°F | Ht 72.0 in | Wt 398.6 lb

## 2020-07-19 DIAGNOSIS — F5101 Primary insomnia: Secondary | ICD-10-CM

## 2020-07-19 DIAGNOSIS — G4733 Obstructive sleep apnea (adult) (pediatric): Secondary | ICD-10-CM

## 2020-07-19 DIAGNOSIS — G463 Brain stem stroke syndrome: Secondary | ICD-10-CM

## 2020-07-19 MED ORDER — TRAZODONE HCL 50 MG PO TABS: ORAL_TABLET | ORAL | 3 refills | Status: DC

## 2020-07-19 NOTE — Patient Instructions (Signed)
Script sent for trazodone 50 mg    Try 1-3 tabs at bedtime for sleep. Try this instead of lorazepam. You can continue the melatonin.  Order- DME [employer]- please fit [name] for nasal mask with chin strap Continue BIPAP 25/22, mask of choice, humidifier, supplies, AirView/ card  Please call as needed

## 2020-07-22 NOTE — Telephone Encounter (Signed)
Please advise on patient mychart message   Lorazepam 0.5 mg vs Trazodone 50 mg  Dr Annamaria Boots,   I read the comparison of the above two medications.   In my mind, I think the potential negative effects from Trazodone outweigh the positive for my circumstances.   I am reluctant to begin taking Trazodone at this point.  I would prefer continuing the [place].  I would prefer to err on the side of caution in this case.    I respect your medical expertise and appreciate your experience in these matters.  Do you think that continuing the Lorazepam is best for now?  Or should we need to discuss further?     I am reluctant to go into more detail in this message about my reasoning.    Thank you for all you do.   Best Regards,  [place].  21 July 2020

## 2020-07-22 NOTE — Telephone Encounter (Signed)
Ok to continue Lorazepam and to discontinue Trazodone from his med list Thanks

## 2020-07-23 NOTE — Telephone Encounter (Signed)
Trazodone has been removed from patients me list. Nothing further needed at this time.

## 2020-07-28 ENCOUNTER — Other Ambulatory Visit: Payer: Self-pay | Admitting: Internal Medicine

## 2020-07-28 DIAGNOSIS — K2100 Gastro-esophageal reflux disease with esophagitis, without bleeding: Secondary | ICD-10-CM

## 2020-07-29 ENCOUNTER — Other Ambulatory Visit: Payer: Self-pay

## 2020-07-29 ENCOUNTER — Encounter: Payer: Medicare Other | Attending: Physical Medicine & Rehabilitation | Admitting: Physical Medicine & Rehabilitation

## 2020-07-29 ENCOUNTER — Encounter: Payer: Self-pay | Admitting: Physical Medicine & Rehabilitation

## 2020-07-29 VITALS — BP 197/82 | HR 69 | Temp 98.6°F | Ht 72.0 in | Wt >= 6400 oz

## 2020-07-29 DIAGNOSIS — M47817 Spondylosis without myelopathy or radiculopathy, lumbosacral region: Secondary | ICD-10-CM | POA: Insufficient documentation

## 2020-07-29 DIAGNOSIS — G463 Brain stem stroke syndrome: Secondary | ICD-10-CM | POA: Diagnosis not present

## 2020-07-29 MED ORDER — TRAMADOL HCL 50 MG PO TABS: 50.0000 mg | ORAL_TABLET | Freq: Three times a day (TID) | ORAL | 5 refills | Status: DC | PRN

## 2020-07-29 NOTE — Patient Instructions (Signed)
Will do lumbar medial branch blocks to inhibit pain from the lumbar facet joints at L4-5 and L5-S1

## 2020-07-29 NOTE — Progress Notes (Signed)
Subjective:    Patient ID: [name], male    DOB: 1945-06-20, 75 y.o.   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  He has a history of lumbar spondylosis and has had lumbar medial branch blocks  L3-4-5 performed on the right side 10/24/2018 which was helpful for his back pain.  Now his pain is across his back involving both right and left sides. He has no sciatic pain going down into his legs.  He has had some progressive weight gain but no falls or other trauma.  He has undergone bariatric surgery in the past. The patient takes tramadol 50 mg approximately twice a day some days 1 time a day sometimes 3 times a day Pain Inventory Average Pain 4 Pain Right Now 4 My pain is sharp, stabbing and aching  In the last 24 hours, has pain interfered with the following? General activity 4 Relation with others 5 Enjoyment of life 4 What TIME of day is your pain at its worst? daytime Sleep (in general) Fair  Pain is worse with: walking, bending and standing Pain improves with: rest, medication and injections Relief from Meds: 5  Family History  Problem Relation Age of Onset  . Depression Mother   . Hypertension Mother   . Dementia Mother   . Heart disease Father        MI  . Depression Brother   . Stroke Paternal Aunt        CVA  . Diabetes Paternal Uncle   . Heart disease Paternal Uncle        MI  . Heart disease Paternal Grandmother        MI  . Diabetes Cousin        PATERNAL   Social History   Socioeconomic History  . Marital status: Married    Spouse name: Not on file  . Number of children: 1  . Years of education: Not on file  . Highest education level: Not on file  Occupational History  . Occupation: retired-Manager [employer]  Tobacco Use  . Smoking status: Former Smoker    Packs/day: 0.20    Years: 13.00    Pack years: 2.60     Types: Cigarettes    Start date: 04/03/1961    Quit date: 08/30/1974    Years since quitting: 45.9  . Smokeless tobacco: Never Used  Vaping Use  . Vaping Use: Never used  Substance and Sexual Activity  . Alcohol use: No    Alcohol/week: 0.0 standard drinks  . Drug use: No  . Sexual activity: Not Currently  Other Topics Concern  . Not on file  Social History Narrative   Lives at home w/ his wife   Right-handed   Caffeine: decaf coffee, occasional tea   Social Determinants of Health   Financial Resource Strain: Low Risk   . Difficulty of Paying Living Expenses: Not very hard  Food Insecurity: No Food Insecurity  . Worried About Charity fundraiser in the Last Year: Never true  . Ran Out of Food in the Last Year: Never true  Transportation Needs: No Transportation Needs  . Lack of Transportation (Medical): No  . Lack of Transportation (Non-Medical): No  Physical Activity: Inactive  . Days of Exercise per Week: 0 days  . Minutes of Exercise per Session: 0 min  Stress: Stress Concern Present  .  Feeling of Stress : Rather much  Social Connections: Not on file   Past Surgical History:  Procedure Laterality Date  . ARTHROSCOPY KNEE W/ DRILLING    . BREATH TEK H PYLORI N/A 12/23/2012   Procedure: BREATH TEK H PYLORI;  Surgeon: Gayland Curry, MD;  Location: [facility];  Service: General;  Laterality: N/A;  . CHOLECYSTECTOMY  1989  . COLONOSCOPY N/A 07/31/2013   Procedure: COLONOSCOPY;  Surgeon: Gatha Mayer, MD;  Location: [place];  Service: Endoscopy;  Laterality: N/A;  . ESOPHAGOGASTRODUODENOSCOPY N/A 07/22/2013   Procedure: ESOPHAGOGASTRODUODENOSCOPY (EGD);  Surgeon: Irene Shipper, MD;  Location: [facility] ENDOSCOPY;  Service: Endoscopy;  Laterality: N/A;  . ESOPHAGOGASTRODUODENOSCOPY N/A 07/31/2013   Procedure: ESOPHAGOGASTRODUODENOSCOPY (EGD);  Surgeon: Gatha Mayer, MD;  Location: [facility] ENDOSCOPY;  Service: Endoscopy;  Laterality: N/A;  . INTRAOPERATIVE TRANSESOPHAGEAL  ECHOCARDIOGRAM N/A 07/18/2013   Procedure: INTRAOPERATIVE TRANSESOPHAGEAL ECHOCARDIOGRAM;  Surgeon: Grace Isaac, MD;  Location: [place];  Service: Open Heart Surgery;  Laterality: N/A;  . LAPAROSCOPIC GASTRIC BANDING WITH HIATAL HERNIA REPAIR N/A 04/08/2013   Procedure: LAPAROSCOPIC GASTRIC BANDING WITH possible  HIATAL HERNIA REPAIR;  Surgeon: Gayland Curry, MD;  Location: [facility];  Service: General;  Laterality: N/A;  . LITHOTRIPSY    . renal calculi    . SUBXYPHOID PERICARDIAL WINDOW N/A 07/18/2013   Procedure: SUBXYPHOID PERICARDIAL WINDOW;  Surgeon: Grace Isaac, MD;  Location: [place];  Service: Thoracic;  Laterality: N/A;  . TONSILLECTOMY    . total knee replacment     bilat  . trigger finger repair     also lue, cts surgically repaired  . WISDOM TOOTH EXTRACTION     Past Surgical History:  Procedure Laterality Date  . ARTHROSCOPY KNEE W/ DRILLING    . BREATH TEK H PYLORI N/A 12/23/2012   Procedure: BREATH TEK H PYLORI;  Surgeon: Gayland Curry, MD;  Location: [facility];  Service: General;  Laterality: N/A;  . CHOLECYSTECTOMY  1989  . COLONOSCOPY N/A 07/31/2013   Procedure: COLONOSCOPY;  Surgeon: Gatha Mayer, MD;  Location: [place];  Service: Endoscopy;  Laterality: N/A;  . ESOPHAGOGASTRODUODENOSCOPY N/A 07/22/2013   Procedure: ESOPHAGOGASTRODUODENOSCOPY (EGD);  Surgeon: Irene Shipper, MD;  [facility];  Service: Endoscopy;  Laterality: N/A;  . ESOPHAGOGASTRODUODENOSCOPY N/A 07/31/2013   Procedure: ESOPHAGOGASTRODUODENOSCOPY (EGD);  Surgeon: Gatha Mayer, MD;  Location: [facility] ENDOSCOPY;  Service: Endoscopy;  Laterality: N/A;  . INTRAOPERATIVE TRANSESOPHAGEAL ECHOCARDIOGRAM N/A 07/18/2013   Procedure: INTRAOPERATIVE TRANSESOPHAGEAL ECHOCARDIOGRAM;  Surgeon: Grace Isaac, MD;  Location: [place];  Service: Open Heart Surgery;  Laterality: N/A;  . LAPAROSCOPIC GASTRIC BANDING WITH HIATAL HERNIA REPAIR N/A 04/08/2013   Procedure: LAPAROSCOPIC GASTRIC BANDING WITH  possible  HIATAL HERNIA REPAIR;  Surgeon: Gayland Curry, MD;  Location: [facility];  Service: General;  Laterality: N/A;  . LITHOTRIPSY    . renal calculi    . SUBXYPHOID PERICARDIAL WINDOW N/A 07/18/2013   Procedure: SUBXYPHOID PERICARDIAL WINDOW;  Surgeon: Grace Isaac, MD;  Location: [place];  Service: Thoracic;  Laterality: N/A;  . TONSILLECTOMY    . total knee replacment     bilat  . trigger finger repair     also lue, cts surgically repaired  . WISDOM TOOTH EXTRACTION     Past Medical History:  Diagnosis Date  . Anemia 07/29/2013  . Asthma    childhood asthma  . Benign essential HTN   . Benign prostatic hypertrophy  several prostate biopsies neg for cancer.   . Bilateral hip pain   . Cancer ([place])   . CHF (congestive heart failure) ([place])   . Chronic anticoagulation 07/22/2013  . Complication of anesthesia    MORPHINE CAUSES HALLUCINATIONS  . CVA (cerebral vascular accident) ([place]) 06/2016  . Depression   . Diastolic congestive heart failure ([place]) 07/16/2013  . Dysphagia, post-stroke   . Embolic cerebral infarction ([place]) 06/21/2016  . FH: colonic polyps 2010  . Gout   . History of kidney stones   . Hypertension   . Kidney cysts   . Morbid obesity ([place])   . Numbness    feet / legs  . PAF (paroxysmal atrial fibrillation) with RVR 07/29/2013  . Pericarditis 2015   s/p window  . Recurrent pulmonary emboli ([place]) 06/13/2015  . Sleep apnea    USES C PAP  . Stroke syndrome   . 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   BP (!) 197/82   Pulse 69   Temp 98.6 F (37 C)   Ht 6' (1.829 m)   Wt (!) 400 lb 3.2 oz (181.5 kg)   SpO2 95%   BMI 54.28 kg/m   Opioid Risk Score:   Fall Risk Score:  `1  Depression screen PHQ 2/9  Depression screen [facility] 2/9 05/12/2020 10/27/2019 10/27/2019 11/14/2018 05/16/2018 10/01/2017 07/28/2016  Decreased Interest 0 0 0 1 1 0 0  Down, Depressed, Hopeless 0 0 0 1 1 0 0  PHQ - 2 Score 0 0 0 2 2 0 0  Altered sleeping 0 - - 1 1 - 0  Tired, decreased energy 0 - -  2 2 - 1  Change in appetite 0 - - 1 0 - 0  Feeling bad or failure about yourself  0 - - 1 1 - 0  Trouble concentrating 0 - - 1 0 - 1  Moving slowly or fidgety/restless 0 - - 0 0 - 0  Suicidal thoughts 0 - - 0 0 - -  PHQ-9 Score 0 - - 8 6 - 2  Difficult doing work/chores - - - Somewhat difficult Not difficult at all - Not difficult at all  Some recent data might be hidden     Review of Systems     Objective:   Physical Exam Vitals and nursing note reviewed.  Constitutional:      Appearance: He is obese.  Eyes:     Extraocular Movements: Extraocular movements intact.     Conjunctiva/sclera: Conjunctivae normal.     Pupils: Pupils are equal, round, and reactive to light.  Musculoskeletal:        General: No tenderness or deformity.     Comments: Tenderness palpation bilateral L4-5 paraspinal areas pain with forward flexion as well as with extension extension is more painful than flexion.  The patient has 50% lumbar flexion and less than 25% of normal lumbar extension.  Skin:    General: Skin is warm and dry.  Neurological:     General: No focal deficit present.     Mental Status: He is alert and oriented to person, place, and time.  Psychiatric:        Mood and Affect: Mood normal.        Behavior: Behavior normal.           Assessment & Plan:  1.  Chronic low back pain he does have lumbar spondylosis on imaging studies and examination is consistent with this we discussed the effect of his weight on his  spine biomechanics and increased load on the facet joints.  He uses a walker for ambulation and tends to lean forward upon this to help reduce load on the facet joints. Will perform bilateral L3-4-5 MBB and assess response.  If greater than 50% he may be candidate for right lumbar RF.  Would have to repeat left lumbar medial branch blocks to have 2 sets of blocks.  Prior injections were in 2020. We discussed the importance of weight loss, I have recommended that he follows up  with his bariatric surgeon

## 2020-08-08 ENCOUNTER — Other Ambulatory Visit: Payer: Self-pay | Admitting: Internal Medicine

## 2020-08-08 DIAGNOSIS — J309 Allergic rhinitis, unspecified: Secondary | ICD-10-CM

## 2020-08-08 DIAGNOSIS — H1010 Acute atopic conjunctivitis, unspecified eye: Secondary | ICD-10-CM

## 2020-08-12 ENCOUNTER — Telehealth: Payer: Self-pay | Admitting: Internal Medicine

## 2020-08-12 ENCOUNTER — Other Ambulatory Visit: Payer: Self-pay | Admitting: Internal Medicine

## 2020-08-12 DIAGNOSIS — E785 Hyperlipidemia, unspecified: Secondary | ICD-10-CM

## 2020-08-12 MED ORDER — ATORVASTATIN CALCIUM 80 MG PO TABS: 1.0000 | ORAL_TABLET | Freq: Every day | ORAL | 1 refills | Status: DC

## 2020-08-12 NOTE — Telephone Encounter (Signed)
   Patient requesting refill for atorvastatin (LIPITOR) 80 MG tablet  [place], [place]

## 2020-08-16 ENCOUNTER — Telehealth: Payer: Self-pay | Admitting: Pharmacist

## 2020-08-16 NOTE — Progress Notes (Signed)
    Chronic Care Management Pharmacy Assistant   Name: [name]  MRN: [id number] DOB: April 21, 1945    Reason for Encounter: Disease State General Call    Recent office visits:  05/06/20 Dr. Berniece Andreas Behavioral medicine 05/12/20 Dr. Ronnald Ramp   Recent consult visits:  06/10/20 Dr. Debara Pickett Cardiology 07/19/20 Dr. Baird Lyons Pulmonology ordered Trazodone 50 mg at bedtime 07/29/20 Dr. Alysia Penna Physical Medicine Rehabilitation changed tramadol to 50 mg every 8 hours  Hospital visits:  None in previous 6 months  Medications: Outpatient Encounter Medications as of 08/16/2020  Medication Sig  . atorvastatin (LIPITOR) 80 MG tablet Take 1 tablet (80 mg total) by mouth daily.  . [facility] MELATONIN GUMMIES PO Take by mouth.  [name] Sodium (DSS) 100 MG CAPS Take 100 mg by mouth every other day.  [name] ELIQUIS 5 MG TABS tablet Take 1 tablet by mouth twice daily  . finasteride (PROSCAR) 5 MG tablet Take 1 tablet (5 mg total) by mouth daily.  . flecainide (TAMBOCOR) 50 MG tablet Take 1 tablet (50 mg total) by mouth every 12 (twelve) hours.  [name] loratadine (CLARITIN) 10 MG tablet Take 10 mg by mouth daily.  [name] LORazepam (ATIVAN) 0.5 MG tablet Take 1 tablet (0.5 mg total) by mouth at bedtime.  . montelukast (SINGULAIR) 10 MG tablet TAKE 1 TABLET(10 MG) BY MOUTH AT BEDTIME  . Multiple Vitamin (MULTIVITAMIN WITH MINERALS) TABS tablet Take 1 tablet by mouth daily.  . pantoprazole (PROTONIX) 40 MG tablet TAKE 1 TABLET(40 MG) BY MOUTH DAILY  . potassium chloride SA (KLOR-CON) 20 MEQ tablet TAKE 1 TABLET BY MOUTH TWICE DAILY  . rOPINIRole (REQUIP) 0.5 MG tablet TAKE 1 TABLET BY MOUTH THREE TIMES DAILY  . sertraline (ZOLOFT) 100 MG tablet Take 1.5 tablets (150 mg total) by mouth at bedtime.  . torsemide (DEMADEX) 20 MG tablet TAKE 3 TABLETS BY MOUTH ONCE DAILY.  . traMADol (ULTRAM) 50 MG tablet Take 1 tablet (50 mg total) by mouth every 8 (eight) hours as needed.  . traZODone (DESYREL) 50 MG tablet 1  to 3 tabs at bedtime for sleep as needed   No facility-administered encounter medications on file as of 08/16/2020.    Have you had any problems recently with your health? Patient states that he is doing well and that he does not have any new health issues  Have you had any problems with your pharmacy? Patient states that he does not have any problems with getting medications or the cost of medications from the pharmacy  What issues or side effects are you having with your medications? Patient states that he is not having any side effects from medications   What would you like me to pass along to [facility] for them to help you with?  Patient states that he is doing well and does not have any concerns about his health or medications at this time  What can we do to take care of you better? Patient states if any changes in his health he will call Dr. Ronnald Ramp office  Star Rating Drugs: Atorvastatin 08/12/20 90 d  [place] Pharmacist Assistant [phone]  Time spent:20

## 2020-08-19 ENCOUNTER — Telehealth: Payer: Self-pay | Admitting: Physical Medicine & Rehabilitation

## 2020-08-19 NOTE — Telephone Encounter (Signed)
Pt is taking Tramedal as prescribed, and it is not touching the pain per the patient.  Is not sure if there is something else that could be prescribed.  He has been added to the waitlist for procedure if one becomes available.

## 2020-08-24 ENCOUNTER — Ambulatory Visit: Payer: Medicare Other | Admitting: Physical Medicine & Rehabilitation

## 2020-08-25 ENCOUNTER — Other Ambulatory Visit: Payer: Self-pay | Admitting: Internal Medicine

## 2020-08-25 DIAGNOSIS — I480 Paroxysmal atrial fibrillation: Secondary | ICD-10-CM

## 2020-08-25 MED ORDER — ELIQUIS 5 MG PO TABS: 1.0000 | ORAL_TABLET | Freq: Two times a day (BID) | ORAL | 0 refills | Status: DC

## 2020-08-26 ENCOUNTER — Ambulatory Visit: Payer: Medicare Other | Admitting: Physical Medicine & Rehabilitation

## 2020-09-03 ENCOUNTER — Encounter: Payer: Medicare Other | Attending: Physical Medicine & Rehabilitation | Admitting: Physical Medicine & Rehabilitation

## 2020-09-03 ENCOUNTER — Other Ambulatory Visit: Payer: Self-pay

## 2020-09-03 ENCOUNTER — Encounter: Payer: Self-pay | Admitting: Physical Medicine & Rehabilitation

## 2020-09-03 VITALS — BP 111/72 | HR 84 | Temp 98.5°F | Ht 72.0 in | Wt 381.6 lb

## 2020-09-03 DIAGNOSIS — M47817 Spondylosis without myelopathy or radiculopathy, lumbosacral region: Secondary | ICD-10-CM | POA: Diagnosis not present

## 2020-09-03 NOTE — Patient Instructions (Addendum)
Lumbar medial branch blocks were performed. This is to help diagnose the cause of the low back pain. It is important that you keep track of your pain for the first day or 2 after injection. This injection can give you temporary relief that lasts for hours or up to several months. There is no way to predict duration of pain relief.  Please try to compare your pain after injection to for the injection.  If this injection gives you  temporary relief there may be another longer-lasting procedure that may be beneficial call radiofrequency ablation     [email or web address]">  DASH Eating Plan DASH stands for Dietary Approaches to Stop Hypertension. The DASH eating plan is a healthy eating plan that has been shown to:  Reduce high blood pressure (hypertension).  Reduce your risk for type 2 diabetes, heart disease, and stroke.  Help with weight loss. What are tips for following this plan? Reading food labels  Check food labels for the amount of salt (sodium) per serving. Choose foods with less than 5 percent of the Daily Value of sodium. Generally, foods with less than 300 milligrams (mg) of sodium per serving fit into this eating plan.  To find whole grains, look for the word "whole" as the first word in the ingredient list. Shopping  Buy products labeled as "low-sodium" or "no salt added."  Buy fresh foods. Avoid canned foods and pre-made or frozen meals. Cooking  Avoid adding salt when cooking. Use salt-free seasonings or herbs instead of table salt or sea salt. Check with your health care provider or pharmacist before using salt substitutes.  Do not fry foods. Cook foods using healthy methods such as baking, boiling, grilling, roasting, and broiling instead.  Cook with heart-healthy oils, such as olive, canola, avocado, soybean, or sunflower oil. Meal planning  Eat a balanced diet that includes: ? 4 or more servings of fruits and 4 or  more servings of vegetables each day. Try to fill one-half of your plate with fruits and vegetables. ? 6-8 servings of whole grains each day. ? Less than 6 oz (170 g) of lean meat, poultry, or fish each day. A 3-oz (85-g) serving of meat is about the same size as a deck of cards. One egg equals 1 oz (28 g). ? 2-3 servings of low-fat dairy each day. One serving is 1 cup (237 mL). ? 1 serving of nuts, seeds, or beans 5 times each week. ? 2-3 servings of heart-healthy fats. Healthy fats called omega-3 fatty acids are found in foods such as walnuts, flaxseeds, fortified milks, and eggs. These fats are also found in cold-water fish, such as sardines, salmon, and mackerel.  Limit how much you eat of: ? Canned or prepackaged foods. ? Food that is high in trans fat, such as some fried foods. ? Food that is high in saturated fat, such as fatty meat. ? Desserts and other sweets, sugary drinks, and other foods with added sugar. ? Full-fat dairy products.  Do not salt foods before eating.  Do not eat more than 4 egg yolks a week.  Try to eat at least 2 vegetarian meals a week.  Eat more home-cooked food and less restaurant, buffet, and fast food.   Lifestyle  When eating at a restaurant, ask that your food be prepared with less salt or no salt, if possible.  If you drink alcohol: ? Limit how much you use to:  0-1 drink a day for women who are not pregnant.  0-2 drinks  a day for men. ? Be aware of how much alcohol is in your drink. In the [place] one drink equals one 12 oz bottle of beer (355 mL), one 5 oz glass of wine (148 mL), or one 1 oz glass of hard liquor (44 mL). General information  Avoid eating more than 2,300 mg of salt a day. If you have hypertension, you may need to reduce your sodium intake to 1,500 mg a day.  Work with your health care provider to maintain a healthy body weight or to lose weight. Ask what an ideal weight is for you.  Get at least 30 minutes of exercise that  causes your heart to beat faster (aerobic exercise) most days of the week. Activities may include walking, swimming, or biking.  Work with your health care provider or dietitian to adjust your eating plan to your individual calorie needs. What foods should I eat? Fruits All fresh, dried, or frozen fruit. Canned fruit in natural juice (without added sugar). Vegetables Fresh or frozen vegetables (raw, steamed, roasted, or grilled). Low-sodium or reduced-sodium tomato and vegetable juice. Low-sodium or reduced-sodium tomato sauce and tomato paste. Low-sodium or reduced-sodium canned vegetables. Grains Whole-grain or whole-wheat bread. Whole-grain or whole-wheat pasta. Brown rice. [place]. Bulgur. Whole-grain and low-sodium cereals. Pita bread. Low-fat, low-sodium crackers. Whole-wheat flour tortillas. Meats and other proteins Skinless chicken or [place]. Ground chicken or [place]. Pork with fat trimmed off. Fish and seafood. Egg whites. Dried beans, peas, or lentils. Unsalted nuts, nut butters, and seeds. Unsalted canned beans. Lean cuts of beef with fat trimmed off. Low-sodium, lean precooked or cured meat, such as sausages or meat loaves. Dairy Low-fat (1%) or fat-free (skim) milk. Reduced-fat, low-fat, or fat-free cheeses. Nonfat, low-sodium ricotta or cottage cheese. Low-fat or nonfat yogurt. Low-fat, low-sodium cheese. Fats and oils Soft margarine without trans fats. Vegetable oil. Reduced-fat, low-fat, or light mayonnaise and salad dressings (reduced-sodium). Canola, safflower, olive, avocado, soybean, and sunflower oils. Avocado. Seasonings and condiments Herbs. Spices. Seasoning mixes without salt. Other foods Unsalted popcorn and pretzels. Fat-free sweets. The items listed above may not be a complete list of foods and beverages you can eat. Contact a dietitian for more information. What foods should I avoid? Fruits Canned fruit in a light or heavy syrup. Fried fruit. Fruit in cream  or butter sauce. Vegetables Creamed or fried vegetables. Vegetables in a cheese sauce. Regular canned vegetables (not low-sodium or reduced-sodium). Regular canned tomato sauce and paste (not low-sodium or reduced-sodium). Regular tomato and vegetable juice (not low-sodium or reduced-sodium). Angie Fava. Olives. Grains Baked goods made with fat, such as croissants, muffins, or some breads. Dry pasta or rice meal packs. Meats and other proteins Fatty cuts of meat. Ribs. Fried meat. Berniece Salines. Bologna, salami, and other precooked or cured meats, such as sausages or meat loaves. Fat from the back of a pig (fatback). Bratwurst. Salted nuts and seeds. Canned beans with added salt. Canned or smoked fish. Whole eggs or egg yolks. Chicken or [place] with skin. Dairy Whole or 2% milk, cream, and half-and-half. Whole or full-fat cream cheese. Whole-fat or sweetened yogurt. Full-fat cheese. Nondairy creamers. Whipped toppings. Processed cheese and cheese spreads. Fats and oils Butter. Stick margarine. Lard. Shortening. Ghee. Bacon fat. Tropical oils, such as coconut, palm kernel, or palm oil. Seasonings and condiments Onion salt, garlic salt, seasoned salt, table salt, and sea salt. Worcestershire sauce. Tartar sauce. Barbecue sauce. Teriyaki sauce. Soy sauce, including reduced-sodium. Steak sauce. Canned and packaged gravies. Fish sauce. Oyster sauce. Cocktail  sauce. Store-bought horseradish. Ketchup. Mustard. Meat flavorings and tenderizers. Bouillon cubes. Hot sauces. Pre-made or packaged marinades. Pre-made or packaged taco seasonings. Relishes. Regular salad dressings. Other foods Salted popcorn and pretzels. The items listed above may not be a complete list of foods and beverages you should avoid. Contact a dietitian for more information. Where to find more information  National Heart, Lung, and Blood Institute: https://wilson-eaton.com/  American Heart Association: www.heart.org  Academy of Nutrition and  Dietetics: www.eatright.[place]: www.kidney.org Summary  The DASH eating plan is a healthy eating plan that has been shown to reduce high blood pressure (hypertension). It may also reduce your risk for type 2 diabetes, heart disease, and stroke.  When on the DASH eating plan, aim to eat more fresh fruits and vegetables, whole grains, lean proteins, low-fat dairy, and heart-healthy fats.  With the DASH eating plan, you should limit salt (sodium) intake to 2,300 mg a day. If you have hypertension, you may need to reduce your sodium intake to 1,500 mg a day.  Work with your health care provider or dietitian to adjust your eating plan to your individual calorie needs. This information is not intended to replace advice given to you by your health care provider. Make sure you discuss any questions you have with your health care provider. Document Revised: 02/21/2019 Document Reviewed: 02/21/2019 Elsevier Patient Education  2021 [employer].

## 2020-09-03 NOTE — Progress Notes (Signed)
  PROCEDURE RECORD [facility]   Name: [name] DOB:[birth year] MRN: [id number]  Date:09/03/2020  Physician: Alysia Penna, MD    Nurse/CMA: Jorja Loa MA  Allergies:  Allergies  Allergen Reactions  . Morphine Nausea And Vomiting and Other (See Comments)    Severe hallucinations  . Codeine Nausea Only  . Penicillins Other (See Comments)    Passed out after injection Did it involve swelling of the face/tongue/throat, SOB, or low BP? Unknown Did it involve sudden or severe rash/hives, skin peeling, or any reaction on the inside of your mouth or nose? No Did you need to seek medical attention at a hospital or doctor's office? Yes When did it last happen?teenager If all above answers are "NO", may proceed with cephalosporin use.  [name] Propoxyphene N-Acetaminophen Nausea Only    Consent Signed: Yes.    Is patient diabetic? No.  CBG today? n/a  Pregnant: No. LMP: No LMP for male patient. (age 45-55)  Anticoagulants: no Anti-inflammatory: no Antibiotics: no  Procedure: Bilateral Medial Branch Block  L3-4-5  Position: Prone Start Time: 1:26 PM End Time:1:38 PM  Fluoro Time:59  RN/CMA Deejay Koppelman CMA PARKER MA    Time 12:54 PM 1:93 pm    BP 111 72 137/73    Pulse 84 77    Respirations 16 16    O2 Sat 95 97    S/S 6 6    Pain Level 5/10 5/10     D/C home with WIFE, patient A & O X 3, D/C instructions reviewed, and sits independently.

## 2020-09-03 NOTE — Progress Notes (Signed)
Bilateral Lumbar L3, L4  medial branch blocks and L 5 dorsal ramus injection under fluoroscopic guidance  Indication: Lumbar pain which is not relieved by medication management or other conservative care and interfering with self-care and mobility.  Informed consent was obtained after describing risks and benefits of the procedure with the patient, this includes bleeding, infection, paralysis and medication side effects.  The patient wishes to proceed and has given written consent.  The patient was placed in prone position.  The lumbar area was marked and prepped with Betadine.  One mL of 1% lidocaine was injected into each of 6 areas into the skin and subcutaneous tissue.  Then a 22-gauge 5in spinal needle was inserted targeting the junction of the left S1 superior articular process and sacral ala junction. Needle was advanced under fluoroscopic guidance.  Bone contact was made.  Isovue 200 was injected x 0.5 mL demonstrating no intravascular uptake.  Then a solution  of 2% MPF lidocaine was injected x 0.5 mL.  Then the left L5 superior articular process in transverse process junction was targeted.  Bone contact was made.  Isovue 200 was injected x 0.5 mL demonstrating no intravascular uptake. Then a solution containing  2% MPF lidocaine was injected x 0.5 mL.  Then the left L4 superior articular process in transverse process junction was targeted.  Bone contact was made.  Isovue 200 was injected x 0.5 mL demonstrating no intravascular uptake.  Then a solution containing2% MPF lidocaine was injected x 0.5 mL.  This same procedure was performed on the right side using the same needle, technique and injectate.  Patient tolerated procedure well.  Post procedure instructions were given. 

## 2020-09-09 DIAGNOSIS — M545 Low back pain, unspecified: Secondary | ICD-10-CM | POA: Diagnosis not present

## 2020-09-09 DIAGNOSIS — K5730 Diverticulosis of large intestine without perforation or abscess without bleeding: Secondary | ICD-10-CM | POA: Diagnosis not present

## 2020-09-09 DIAGNOSIS — M109 Gout, unspecified: Secondary | ICD-10-CM | POA: Diagnosis not present

## 2020-09-09 DIAGNOSIS — N281 Cyst of kidney, acquired: Secondary | ICD-10-CM | POA: Diagnosis not present

## 2020-09-09 DIAGNOSIS — Q6102 Congenital multiple renal cysts: Secondary | ICD-10-CM | POA: Diagnosis not present

## 2020-09-09 DIAGNOSIS — N200 Calculus of kidney: Secondary | ICD-10-CM | POA: Diagnosis not present

## 2020-09-09 DIAGNOSIS — R109 Unspecified abdominal pain: Secondary | ICD-10-CM | POA: Diagnosis not present

## 2020-09-20 ENCOUNTER — Telehealth: Payer: Self-pay | Admitting: Pharmacist

## 2020-09-21 ENCOUNTER — Other Ambulatory Visit: Payer: Self-pay

## 2020-09-21 ENCOUNTER — Ambulatory Visit (INDEPENDENT_AMBULATORY_CARE_PROVIDER_SITE_OTHER): Payer: Medicare Other | Admitting: Pharmacist

## 2020-09-21 DIAGNOSIS — I10 Essential (primary) hypertension: Secondary | ICD-10-CM | POA: Diagnosis not present

## 2020-09-21 DIAGNOSIS — E118 Type 2 diabetes mellitus with unspecified complications: Secondary | ICD-10-CM

## 2020-09-21 DIAGNOSIS — E785 Hyperlipidemia, unspecified: Secondary | ICD-10-CM

## 2020-09-21 DIAGNOSIS — I5032 Chronic diastolic (congestive) heart failure: Secondary | ICD-10-CM | POA: Diagnosis not present

## 2020-09-21 DIAGNOSIS — I480 Paroxysmal atrial fibrillation: Secondary | ICD-10-CM | POA: Diagnosis not present

## 2020-09-21 NOTE — Progress Notes (Signed)
Chronic Care Management Pharmacy Note  09/23/2020 Name:  [name] MRN:  [id number] DOB:  [birth year]  Summary: -Pt did not qualify for Eliquis PAP earlier this year due to failure to meet OOP cost minimum  -Counseled on lifestyle modifications for weight loss  Recommendations/Changes made from today's visit: -Pt to contact pharmacist when OOP costs total >$1500 to reapply for Eliquis PAP -Advised to get Shingles vaccine at local pharmacy    Subjective: [name] is an 75 y.o. year old male who is a primary patient of Janith Lima, MD.  The CCM team was consulted for assistance with disease management and care coordination needs.    Engaged with patient by telephone for follow up visit in response to provider referral for pharmacy case management and/or care coordination services.   Consent to Services:  The patient was given information about Chronic Care Management services, agreed to services, and gave verbal consent prior to initiation of services.  Please see initial visit note for detailed documentation.   Patient Care Team: Janith Lima, MD as PCP - General (Internal Medicine) Debara Pickett [name], MD as PCP - Cardiology (Cardiology) Himmelrich, Bryson Ha, RD (Inactive) as Dietitian Tia Masker) Leta Baptist, MD as Consulting Physician (Otolaryngology) Kathlee Nations, MD as Consulting Physician (Psychiatry) Debara Pickett [name], MD as Consulting Physician (Cardiology) Chyrel Masson, DO (Otolaryngology) Deneise Lever, MD as Consulting Physician (Pulmonary Disease) Charlton Haws, [facility] as Pharmacist (Pharmacist)  Recent office visits: 05/12/20 Dr Ronnald Ramp OV: chronic f/u; labs stable; no med changes  Recent consult visits: 09/03/20 Dr Letta Pate (phys med): f/u lumbosacral spondylosis. Nerve blocking injections.  07/19/20 Dr Annamaria Boots (pulmonary): f/u OSA. C/o trouble falling asleep. Rx'd trazodone 50 mg to take instead of lorazepam.  06/10/20 Dr Debara Pickett  (cardiology): f/u Afib, CHF, DOE. Offered weight mgmt clinic, pt declined. SOB related to weight.   05/06/20 Dr Adele Schilder (psychiatry): f/u MDD. Healthy diet counseling. No med changes.  Hospital visits: None in previous 6 months   Objective:  Lab Results  Component Value Date   CREATININE 1.21 05/12/2020   BUN 25 (H) 05/12/2020   GFR 59.03 (L) 05/12/2020   GFRNONAA 61 10/27/2019   GFRAA 71 10/27/2019   NA 142 05/12/2020   K 3.7 05/12/2020   CALCIUM 8.8 05/12/2020   CO2 34 (H) 05/12/2020   GLUCOSE 109 (H) 05/12/2020    Lab Results  Component Value Date/Time   HGBA1C 6.5 05/12/2020 02:32 PM   HGBA1C 6.4 (H) 10/27/2019 02:00 PM   GFR 59.03 (L) 05/12/2020 02:32 PM   GFR 49.35 (L) 05/16/2018 04:16 PM   MICROALBUR 7.1 10/27/2019 02:00 PM   MICROALBUR 1.1 12/12/2006 08:55 AM    Last diabetic Eye exam:  Lab Results  Component Value Date/Time   HMDIABEYEEXA No Retinopathy 05/14/2019 12:00 AM    Last diabetic Foot exam: No results found for: HMDIABFOOTEX   Lab Results  Component Value Date   CHOL 132 10/27/2019   HDL 29 (L) 10/27/2019   LDLCALC 74 10/27/2019   LDLDIRECT 91.0 05/16/2018   TRIG 191 (H) 10/27/2019   CHOLHDL 4.6 10/27/2019    Hepatic Function Latest Ref Rng & Units 10/27/2019 05/16/2018 06/22/2016  Total Protein 6.1 - 8.1 g/dL 6.1 6.9 6.2(L)  Albumin 3.5 - 5.2 g/dL - 3.6 3.4(L)  AST 10 - 35 U/L '21 21 26  ' ALT 9 - 46 U/L '18 17 19  ' Alk Phosphatase 39 - 117 U/L - 111 85  Total Bilirubin 0.2 -  1.2 mg/dL 0.9 0.8 1.2  Bilirubin, Direct 0.0 - 0.2 mg/dL 0.2 - -    Lab Results  Component Value Date/Time   TSH 4.33 10/27/2019 02:00 PM   TSH 2.04 05/16/2018 04:16 PM    CBC Latest Ref Rng & Units 05/12/2020 10/27/2019 10/14/2018  WBC 4.0 - 10.5 K/uL 10.5 11.5(H) 9.7  Hemoglobin 13.0 - 17.0 g/dL 13.7 13.4 13.9  Hematocrit 39.0 - 52.0 % 40.4 41.5 45.3  Platelets 150.0 - 400.0 K/uL 219.0 242 212   CHA2DS2-VASc Score = 5  The patient's score is based upon: CHF  History: No HTN History: Yes Diabetes History: Yes Stroke History: Yes Vascular Disease History: No   {Confirm score is correct.  If not, click here to update score.  REFRESH note. :  No results found for: VD25OH  Clinical ASCVD: No  The ASCVD Risk score Mikey Bussing DC Jr., et al., 2013) failed to calculate for the following reasons:   The patient has a prior MI or stroke diagnosis    Depression screen [facility] 2/9 05/12/2020 10/27/2019 10/27/2019  Decreased Interest 0 0 0  Down, Depressed, Hopeless 0 0 0  PHQ - 2 Score 0 0 0  Altered sleeping 0 - -  Tired, decreased energy 0 - -  Change in appetite 0 - -  Feeling bad or failure about yourself  0 - -  Trouble concentrating 0 - -  Moving slowly or fidgety/restless 0 - -  Suicidal thoughts 0 - -  PHQ-9 Score 0 - -  Difficult doing work/chores - - -  Some recent data might be hidden     Social History   Tobacco Use  Smoking Status Former   Packs/day: 0.20   Years: 13.00   Pack years: 2.60   Types: Cigarettes   Start date: 04/03/1961   Quit date: 08/30/1974   Years since quitting: 46.0  Smokeless Tobacco Never   BP Readings from Last 3 Encounters:  09/03/20 111/72  07/29/20 (!) 197/82  07/19/20 138/72   Pulse Readings from Last 3 Encounters:  09/03/20 84  07/29/20 69  07/19/20 83   Wt Readings from Last 3 Encounters:  09/03/20 (!) 381 lb 9.6 oz (173.1 kg)  07/29/20 (!) 400 lb 3.2 oz (181.5 kg)  07/19/20 (!) 398 lb 9.6 oz (180.8 kg)   BMI Readings from Last 3 Encounters:  09/03/20 51.75 kg/m  07/29/20 54.28 kg/m  07/19/20 54.06 kg/m    Assessment/Interventions: Review of patient past medical history, allergies, medications, health status, including review of consultants reports, laboratory and other test data, was performed as part of comprehensive evaluation and provision of chronic care management services.   SDOH:  (Social Determinants of Health) assessments and interventions performed: Yes  SDOH Screenings    Alcohol Screen: Low Risk    Last Alcohol Screening Score (AUDIT): 0  Depression (PHQ2-9): Low Risk    PHQ-2 Score: 0  Financial Resource Strain: Not on file  Food Insecurity: No Food Insecurity   Worried About Charity fundraiser in the Last Year: Never true   Ran Out of Food in the Last Year: Never true  Housing: Low Risk    Last Housing Risk Score: 0  Physical Activity: Inactive   Days of Exercise per Week: 0 days   Minutes of Exercise per Session: 0 min  Social Connections: Not on file  Stress: Stress Concern Present   Feeling of Stress : Rather much  Tobacco Use: Medium Risk   Smoking Tobacco Use: Former  Smokeless Tobacco Use: Never  Transportation Needs: No Transportation Needs   Lack of Transportation (Medical): No   Lack of Transportation (Non-Medical): No    CCM Care Plan  Allergies  Allergen Reactions   Morphine Nausea And Vomiting and Other (See Comments)    Severe hallucinations   Codeine Nausea Only   Penicillins Other (See Comments)    Passed out after injection Did it involve swelling of the face/tongue/throat, SOB, or low BP? Unknown Did it involve sudden or severe rash/hives, skin peeling, or any reaction on the inside of your mouth or nose? No Did you need to seek medical attention at a hospital or doctor's office? Yes When did it last happen?    teenager   If all above answers are "NO", may proceed with cephalosporin use.   Propoxyphene N-Acetaminophen Nausea Only    Medications Reviewed Today     Reviewed by Charlton Haws, [facility] (Pharmacist) on 09/23/20 at 1445  Med List Status: <None>   Medication Order Taking? Sig Documenting Provider Last Dose Status Informant  apixaban (ELIQUIS) 5 MG TABS tablet [id number] Yes Take 1 tablet (5 mg total) by mouth 2 (two) times daily. Janith Lima, MD Taking Active   atorvastatin (LIPITOR) 80 MG tablet [id number] Yes Take 1 tablet (80 mg total) by mouth daily. Janith Lima, MD Taking Active   [facility]  MELATONIN GUMMIES PO [id number] Yes Take by mouth. [provider] Taking Active   Docusate Sodium (DSS) 100 MG CAPS [id number] Yes Take 100 mg by mouth every other day. Kelvin Cellar, MD Taking Active Multiple Informants  finasteride (PROSCAR) 5 MG tablet [id number] Yes Take 1 tablet (5 mg total) by mouth daily. Cathlyn Parsons, PA-C Taking Active Multiple Informants  flecainide (TAMBOCOR) 50 MG tablet [id number] Yes Take 1 tablet (50 mg total) by mouth every 12 (twelve) hours. Pixie Casino, MD Taking Active   loratadine (CLARITIN) 10 MG tablet [id number] Yes Take 10 mg by mouth daily. [provider] Taking Active Multiple Informants  LORazepam (ATIVAN) 0.5 MG tablet [id number] Yes Take 1 tablet (0.5 mg total) by mouth at bedtime. Kathlee Nations, MD Taking Active   methylPREDNISolone (MEDROL DOSEPAK) 4 MG TBPK tablet [id number] Yes See admin instructions. follow package directions [provider] Taking Active   montelukast (SINGULAIR) 10 MG tablet [id number] Yes TAKE 1 TABLET(10 MG) BY MOUTH AT BEDTIME Janith Lima, MD Taking Active   Multiple Vitamin (MULTIVITAMIN WITH MINERALS) TABS tablet [id number] Yes Take 1 tablet by mouth daily. [provider] Taking Active   pantoprazole (PROTONIX) 40 MG tablet [id number] Yes TAKE 1 TABLET(40 MG) BY MOUTH DAILY Janith Lima, MD Taking Active   potassium chloride SA (KLOR-CON) 20 MEQ tablet [id number] Yes TAKE 1 TABLET BY MOUTH TWICE DAILY [name], [name], MD Taking Active   rOPINIRole (REQUIP) 0.5 MG tablet [id number] Yes TAKE 1 TABLET BY MOUTH THREE TIMES DAILY Young, [name], MD Taking Active   sertraline (ZOLOFT) 100 MG tablet [id number] Yes Take 1.5 tablets (150 mg total) by mouth at bedtime. Kathlee Nations, MD Taking Active   torsemide (DEMADEX) 20 MG tablet [id number] Yes TAKE 3 TABLETS BY MOUTH ONCE DAILY. Pixie Casino, MD Taking Active   torsemide (DEMADEX) 20 MG tablet [id number] Yes daily. [provider] Taking Active   traMADol (ULTRAM) 50 MG tablet [id number] Yes Take 1 tablet (50 mg total) by mouth every 8 (eight) hours as needed. 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   Vocal cord dysfunction 11/22/2016   Benign essential HTN    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/66/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/21/2016   OSA (obstructive sleep apnea)    Dysphagia, post-stroke    Neurologic gait disorder    Encounter for monitoring flecainide therapy 04/28/2016   Recurrent pulmonary emboli ([place]) 06/13/2015   GERD (gastroesophageal reflux disease) 06/13/2015   Basal cell carcinoma of skin 06/28/2014   Periodic limb movement sleep disorder 06/19/2014   PAF (paroxysmal atrial fibrillation) ([place]) 07/29/2013   Chronic anticoagulation 07/22/2013   Gastric ulcer with hemorrhage 07/20/13 65/46/5035   Diastolic congestive heart failure ([place]) 07/16/2013   H/O laparoscopic adjustable gastric banding & hiatal hernia repair 04/08/13 04/23/2013   Morbid obesity ([place]) 08/09/2012   Peripheral neuropathy ([place]) 11/02/2009   Hyperlipidemia with target LDL less than 130 09/10/2008   GOUT 06/28/2006   BPH (benign prostatic hyperplasia) 06/28/2006    Immunization History  Administered Date(s) Administered   Fluad Quad(high Dose 65+) 12/11/2018   Influenza, High Dose Seasonal PF 01/20/2016, 01/31/2017, 03/06/2018   Influenza,inj,Quad PF,6+ Mos 04/09/2013   Influenza-Unspecified  03/08/2018, 03/02/2020   PFIZER(Purple Top)SARS-COV-2 Vaccination 05/10/2019, 06/04/2019, 03/02/2020   Pneumococcal Conjugate-13 10/20/2015   Pneumococcal Polysaccharide-23 04/09/2013, 10/27/2019   Td 11/02/2009   Tdap 01/20/2016    Conditions to be addressed/monitored:  Hypertension, Hyperlipidemia, Diabetes, Atrial Fibrillation, Heart Failure, Coronary Artery Disease, Depression, Anxiety, and Back pain  Care Plan : CCM Pharmacy Care Plan  Updates made by Charlton Haws, [place] since 09/23/2020 12:00 AM     Problem: Hypertension, Hyperlipidemia, Diabetes, Atrial Fibrillation, Heart Failure, Coronary Artery Disease, Depression, Anxiety, and Back pain   Priority: High     Long-Range Goal: Disease management   Start Date: 09/23/2020  Expected End Date: 09/23/2021  This Visit's Progress: On track  Priority: High  Note:   Current Barriers:  Unable to independently monitor therapeutic efficacy  Pharmacist Clinical Goal(s):  Patient will achieve adherence to monitoring guidelines and medication adherence to achieve therapeutic efficacy through collaboration with PharmD and provider.   Interventions: 1:1 collaboration with Janith Lima, MD regarding development and update of comprehensive plan of care as evidenced by provider attestation and co-signature Inter-disciplinary care team collaboration (see longitudinal plan of care) Comprehensive medication review performed; medication list updated in electronic medical record  AFIB    Hx GI bleed 07/2013. Patient is currently rhythm controlled.  Patient has failed these meds in past: n/a Patient is currently controlled on the following medications: Flecainide 50 mg q12h, Eliquis 5 mg BID   We discussed: Benefits of Eliquis for stroke prevention; Bleeding risks; avoid NSAIDs; pt does not yet qualify for Eliquis PAP (OOP costs needs to be > $1500)   Plan: Continue current medications   Heart Failure / Hypertension    Type:  Diastolic Last ejection fraction: 10/10/2018 60-65%   Patient has failed these meds in past: amlodipine, furosemide,  metoprolol tartrate,   Patient is currently controlled on the following medications: Torsemide 20 mg x 3 daily - takes 4-5 days per week Potassium 20 mEq BID   We discussed: takes loop diuretic 4-5 days per week, he does not take it on days he has to go out because it causes frequent urination; pt reports SOB but unrelated to fluid per cardiologist - obesity contributing.   Plan: Continue current medications   Diabetes    A1c goal < 7% No medication indicated   We discussed: diet and exercise extensively;  Discussed importance of lifestyle modifications if he wants to avoid more medications. Pt reports he was able to lose ~100 lbs before and after bariatric surgery while on calorie restrictive diet. Can consider GLP-1 agonist in future.   Plan: Continue control with diet and exercise   Hyperlipidemia / CAD    LDL goal < 70 Hx embolic stroke. Hx aortic atherosclerosis and coronary artery calcification on CT scan  Patient has failed these meds in past: n/a Patient is currently controlled on the following medications: Atorvastatin 80 mg daily AM   We discussed:  diet and exercise extensively; no antiplatelet due to Eliquis; LDL has improved from 91 to 74 from 2020 to 2021.   Plan: Continue current medications and control with diet and exercise    Depression/anxiety  Patient has failed these meds in past: n/a Patient is currently controlled on the following medications: Sertraline 150 mg HS, Lorazepam 0.5 mg HS   We discussed: Pt reports good control with medications as prescribed, denies issues currently   Plan: Continue current medications   Pain    Lumbosacral spondylosis  Patient has failed these meds in past: hydrocodone/APAP Patient is currently controlled on the following medications: Tramadol 50 mg q8h prn (typically BID) Lidocaine 5%  patch Methylprednisolone PRN   We discussed:  Can't take NSAIDs due to Eliquis, tramadol helps a lot. He does endorse significant back pain making it difficult to exercise or even stand for short periods of time. Encouraged weight loss to help with pain as well.   Plan: Continue current medications   Patient Goals/Self-Care Activities Patient will:  - take medications as prescribed focus on medication adherence by pill box check blood pressure 2-3x per week, document, and provide at future appointments collaborate with provider on medication access solutions target a minimum of 150 minutes of moderate intensity exercise weekly     Medication Assistance:  Applied for Eliquis PAP Spring 2022 - denied due to failure to meet OOP cost requirement. Can re-apply later in the year once OOP costs total > $1500  Compliance/Adherence/Medication fill history: Care Gaps: Shingrix Covid booster (due 05/31/20) Eye exam  Star-Rating Drugs: Atorvastatin - LF 08/12/20 x 90 ds  Patient's preferred pharmacy is:  [id number] [address] [place] [address] Phone: [phone] Fax: [phone]  Uses pill box? Yes Pt endorses 100% compliance  We discussed: Current pharmacy is preferred with insurance plan and patient is satisfied with pharmacy services Patient decided to: Continue current medication management strategy  Care Plan and Follow Up Patient Decision:  Patient agrees to Care Plan and Follow-up.  Plan: Telephone follow up appointment with care management team member scheduled for:  6 months  Charlene Brooke, PharmD, [place], CPP Clinical Pharmacist [facility] [phone]

## 2020-09-23 NOTE — Patient Instructions (Signed)
Visit Information  Phone number for Pharmacist: [phone]   Goals Addressed             This Visit's Progress    Manage My Medicine       Timeframe:  Long-Range Goal Priority:  Medium Start Date:   09/21/20                          Expected End Date:     09/21/21                  Follow Up Date Dec 2022   - call for medicine refill 2 or 3 days before it runs out - call if I am sick and can't take my medicine - keep a list of all the medicines I take; vitamins and herbals too - use a pillbox to sort medicine  -contact CCM pharmacist when out of pocket costs total > $1500   Why is this important?   These steps will help you keep on track with your medicines.   Notes:          Patient verbalizes understanding of instructions provided today and agrees to view in [place].  Telephone follow up appointment with pharmacy team member scheduled for: 6 months  Charlene Brooke, PharmD, [place], CPP Clinical Pharmacist [facility] at [facility] [phone]

## 2020-09-27 ENCOUNTER — Encounter: Payer: Self-pay | Admitting: Internal Medicine

## 2020-09-27 DIAGNOSIS — G4700 Insomnia, unspecified: Secondary | ICD-10-CM | POA: Insufficient documentation

## 2020-09-27 NOTE — Assessment & Plan Note (Signed)
Benefits from his BIPAP, but needs help with mask. Seal difficult at these pressures. Plan- continue BIPAP 25/22. Try nasal mask with chin strap.

## 2020-09-27 NOTE — Progress Notes (Signed)
    Chronic Care Management Pharmacy Assistant   Name: [name]  MRN: [id number] DOB: [birth year]   Reason for Encounter: Disease State   Conditions to be addressed/monitored: General Call   Recent office visits:  None ID  Recent consult visits:  None ID  Hospital visits:  None in previous 6 months  Medications: Outpatient Encounter Medications as of 09/20/2020  Medication Sig Note   apixaban (ELIQUIS) 5 MG TABS tablet Take 1 tablet (5 mg total) by mouth 2 (two) times daily.    atorvastatin (LIPITOR) 80 MG tablet Take 1 tablet (80 mg total) by mouth daily.    [facility] MELATONIN GUMMIES PO Take by mouth.    Docusate Sodium (DSS) 100 MG CAPS Take 100 mg by mouth every other day.    finasteride (PROSCAR) 5 MG tablet Take 1 tablet (5 mg total) by mouth daily.    flecainide (TAMBOCOR) 50 MG tablet Take 1 tablet (50 mg total) by mouth every 12 (twelve) hours.    loratadine (CLARITIN) 10 MG tablet Take 10 mg by mouth daily.    LORazepam (ATIVAN) 0.5 MG tablet Take 1 tablet (0.5 mg total) by mouth at bedtime.    methylPREDNISolone (MEDROL DOSEPAK) 4 MG TBPK tablet See admin instructions. follow package directions    montelukast (SINGULAIR) 10 MG tablet TAKE 1 TABLET(10 MG) BY MOUTH AT BEDTIME    Multiple Vitamin (MULTIVITAMIN WITH MINERALS) TABS tablet Take 1 tablet by mouth daily.    pantoprazole (PROTONIX) 40 MG tablet TAKE 1 TABLET(40 MG) BY MOUTH DAILY    potassium chloride SA (KLOR-CON) 20 MEQ tablet TAKE 1 TABLET BY MOUTH TWICE DAILY    rOPINIRole (REQUIP) 0.5 MG tablet TAKE 1 TABLET BY MOUTH THREE TIMES DAILY    sertraline (ZOLOFT) 100 MG tablet Take 1.5 tablets (150 mg total) by mouth at bedtime.    torsemide (DEMADEX) 20 MG tablet TAKE 3 TABLETS BY MOUTH ONCE DAILY.    torsemide (DEMADEX) 20 MG tablet daily.    traMADol (ULTRAM) 50 MG tablet Take 1 tablet (50 mg total) by mouth every 8 (eight) hours as needed. 09/03/2020: LF 07/29/20 #90 TODAY #65 LD 09/03/20   traZODone  (DESYREL) 50 MG tablet 1 to 3 tabs at bedtime for sleep as needed    No facility-administered encounter medications on file as of 09/20/2020.   Pharmacist Review  Have you had any problems recently with your health? Patient states that he is doing ok, but he did fall but did not go to the hospital. He states that he is having issues with his balance, has to focus on something to keep balance last Saturday.  Have you had any problems with your pharmacy? Patient states that he is not having any issues with getting medications from the the pharmacy or the cost of medications from the pharmacy  What issues or side effects are you having with your medications? No side effects from medications  What would you like me to pass along to [facility], CPP for them to help you with?  Patient states that he is doing ok, just need to lose weight and would like some help in trying to do this it would be great.  What can we do to take care of you better?  Patient states that for now he is find, and if any changes will contact Dr. Gifford Shave [facility] Clinical Pharmacist Assistant [phone]   Time spent:30

## 2020-09-27 NOTE — Assessment & Plan Note (Addendum)
Not sure how much related to depression vs organic/ vascular.  Plan- sleep hygiene, try Trazodone

## 2020-09-30 ENCOUNTER — Telehealth: Payer: Self-pay | Admitting: Pharmacist

## 2020-09-30 NOTE — Progress Notes (Signed)
    Chronic Care Management Pharmacy Assistant   Name: [name]  MRN: [id number] DOB: [birth year]   Reason for Encounter: Chart Review    Medications: Outpatient Encounter Medications as of 09/30/2020  Medication Sig Note   apixaban (ELIQUIS) 5 MG TABS tablet Take 1 tablet (5 mg total) by mouth 2 (two) times daily.    atorvastatin (LIPITOR) 80 MG tablet Take 1 tablet (80 mg total) by mouth daily.    [facility] MELATONIN GUMMIES PO Take by mouth.    Docusate Sodium (DSS) 100 MG CAPS Take 100 mg by mouth every other day.    finasteride (PROSCAR) 5 MG tablet Take 1 tablet (5 mg total) by mouth daily.    flecainide (TAMBOCOR) 50 MG tablet Take 1 tablet (50 mg total) by mouth every 12 (twelve) hours.    loratadine (CLARITIN) 10 MG tablet Take 10 mg by mouth daily.    LORazepam (ATIVAN) 0.5 MG tablet Take 1 tablet (0.5 mg total) by mouth at bedtime.    methylPREDNISolone (MEDROL DOSEPAK) 4 MG TBPK tablet See admin instructions. follow package directions    montelukast (SINGULAIR) 10 MG tablet TAKE 1 TABLET(10 MG) BY MOUTH AT BEDTIME    Multiple Vitamin (MULTIVITAMIN WITH MINERALS) TABS tablet Take 1 tablet by mouth daily.    pantoprazole (PROTONIX) 40 MG tablet TAKE 1 TABLET(40 MG) BY MOUTH DAILY    potassium chloride SA (KLOR-CON) 20 MEQ tablet TAKE 1 TABLET BY MOUTH TWICE DAILY    rOPINIRole (REQUIP) 0.5 MG tablet TAKE 1 TABLET BY MOUTH THREE TIMES DAILY    sertraline (ZOLOFT) 100 MG tablet Take 1.5 tablets (150 mg total) by mouth at bedtime.    torsemide (DEMADEX) 20 MG tablet TAKE 3 TABLETS BY MOUTH ONCE DAILY.    torsemide (DEMADEX) 20 MG tablet daily.    traMADol (ULTRAM) 50 MG tablet Take 1 tablet (50 mg total) by mouth every 8 (eight) hours as needed. 09/03/2020: LF 07/29/20 #90 TODAY #65 LD 09/03/20   traZODone (DESYREL) 50 MG tablet 1 to 3 tabs at bedtime for sleep as needed    No facility-administered encounter medications on file as of 09/30/2020.   Pharmacist Review Reviewed chart  for medication changes and adherence.  No OVs, Consults, or hospital visits since last care coordination call / Pharmacist visit. No medication changes indicated  No gaps in adherence identified. Patient has follow up scheduled with pharmacy team. No further action required.   [place] Pharmacist Assistant [phone]   Time spent:8

## 2020-10-05 DIAGNOSIS — Z20822 Contact with and (suspected) exposure to covid-19: Secondary | ICD-10-CM | POA: Diagnosis not present

## 2020-10-19 NOTE — Telephone Encounter (Signed)
Can you print a med list history for the last year or two and mark which are the pain meds,Have pt pick up

## 2020-10-26 ENCOUNTER — Other Ambulatory Visit: Payer: Self-pay

## 2020-10-26 MED ORDER — ROPINIROLE HCL 0.5 MG PO TABS: 0.5000 mg | ORAL_TABLET | Freq: Three times a day (TID) | ORAL | 2 refills | Status: DC

## 2020-11-01 ENCOUNTER — Telehealth (HOSPITAL_COMMUNITY): Payer: Medicare Other | Admitting: Psychiatry

## 2020-11-02 ENCOUNTER — Other Ambulatory Visit: Payer: Self-pay

## 2020-11-02 ENCOUNTER — Emergency Department (HOSPITAL_COMMUNITY)
Admission: EM | Admit: 2020-11-02 | Discharge: 2020-11-03 | Disposition: A | Discharge status: Home / Self Care | Payer: Medicare Other | Attending: Emergency Medicine | Admitting: Emergency Medicine

## 2020-11-02 DIAGNOSIS — Z8673 Personal history of transient ischemic attack (TIA), and cerebral infarction without residual deficits: Secondary | ICD-10-CM | POA: Diagnosis not present

## 2020-11-02 DIAGNOSIS — Z96653 Presence of artificial knee joint, bilateral: Secondary | ICD-10-CM | POA: Diagnosis not present

## 2020-11-02 DIAGNOSIS — I5030 Unspecified diastolic (congestive) heart failure: Secondary | ICD-10-CM | POA: Insufficient documentation

## 2020-11-02 DIAGNOSIS — Z7901 Long term (current) use of anticoagulants: Secondary | ICD-10-CM | POA: Diagnosis not present

## 2020-11-02 DIAGNOSIS — G510 Bell's palsy: Secondary | ICD-10-CM | POA: Insufficient documentation

## 2020-11-02 DIAGNOSIS — Z87891 Personal history of nicotine dependence: Secondary | ICD-10-CM | POA: Diagnosis not present

## 2020-11-02 DIAGNOSIS — Z85828 Personal history of other malignant neoplasm of skin: Secondary | ICD-10-CM | POA: Insufficient documentation

## 2020-11-02 DIAGNOSIS — E1142 Type 2 diabetes mellitus with diabetic polyneuropathy: Secondary | ICD-10-CM | POA: Diagnosis not present

## 2020-11-02 DIAGNOSIS — I10 Essential (primary) hypertension: Secondary | ICD-10-CM | POA: Diagnosis not present

## 2020-11-02 DIAGNOSIS — J45909 Unspecified asthma, uncomplicated: Secondary | ICD-10-CM | POA: Insufficient documentation

## 2020-11-02 DIAGNOSIS — I110 Hypertensive heart disease with heart failure: Secondary | ICD-10-CM | POA: Insufficient documentation

## 2020-11-02 DIAGNOSIS — Z79899 Other long term (current) drug therapy: Secondary | ICD-10-CM | POA: Insufficient documentation

## 2020-11-02 DIAGNOSIS — H1031 Unspecified acute conjunctivitis, right eye: Secondary | ICD-10-CM

## 2020-11-02 DIAGNOSIS — R29810 Facial weakness: Secondary | ICD-10-CM | POA: Diagnosis not present

## 2020-11-02 DIAGNOSIS — H1033 Unspecified acute conjunctivitis, bilateral: Secondary | ICD-10-CM | POA: Diagnosis not present

## 2020-11-02 LAB — DIFFERENTIAL
Abs Immature Granulocytes: 0.04 10*3/uL (ref 0.00–0.07)
Basophils Absolute: 0.1 10*3/uL (ref 0.0–0.1)
Basophils Relative: 1 %
Eosinophils Absolute: 0.2 10*3/uL (ref 0.0–0.5)
Eosinophils Relative: 2 %
Immature Granulocytes: 1 %
Lymphocytes Relative: 20 %
Lymphs Abs: 1.7 10*3/uL (ref 0.7–4.0)
Monocytes Absolute: 0.8 10*3/uL (ref 0.1–1.0)
Monocytes Relative: 9 %
Neutro Abs: 6 10*3/uL (ref 1.7–7.7)
Neutrophils Relative %: 67 %

## 2020-11-02 LAB — COMPREHENSIVE METABOLIC PANEL
ALT: 17 U/L (ref 0–44)
AST: 26 U/L (ref 15–41)
Albumin: 3.1 g/dL — ABNORMAL LOW (ref 3.5–5.0)
Alkaline Phosphatase: 103 U/L (ref 38–126)
Anion gap: 12 (ref 5–15)
BUN: 21 mg/dL (ref 8–23)
CO2: 27 mmol/L (ref 22–32)
Calcium: 8.2 mg/dL — ABNORMAL LOW (ref 8.9–10.3)
Chloride: 101 mmol/L (ref 98–111)
Creatinine, Ser: 1.23 mg/dL (ref 0.61–1.24)
GFR, Estimated: >60 mL/min (ref >60)
Glucose, Bld: 155 mg/dL — ABNORMAL HIGH (ref 70–99)
Potassium: 2.8 mmol/L — ABNORMAL LOW (ref 3.5–5.1)
Sodium: 140 mmol/L (ref 135–145)
Total Bilirubin: 0.7 mg/dL (ref 0.3–1.2)
Total Protein: 6.2 g/dL — ABNORMAL LOW (ref 6.5–8.1)

## 2020-11-02 LAB — URINALYSIS, ROUTINE W REFLEX MICROSCOPIC
Bilirubin Urine: NEGATIVE
Glucose, UA: NEGATIVE mg/dL
Hgb urine dipstick: NEGATIVE
Ketones, ur: NEGATIVE mg/dL
Leukocytes,Ua: NEGATIVE
Nitrite: NEGATIVE
Protein, ur: NEGATIVE mg/dL
Specific Gravity, Urine: 1.011 (ref 1.005–1.030)
pH: 5 (ref 5.0–8.0)

## 2020-11-02 LAB — CBC
HCT: 44.1 % (ref 39.0–52.0)
Hemoglobin: 14.2 g/dL (ref 13.0–17.0)
MCH: 29.9 pg (ref 26.0–34.0)
MCHC: 32.2 g/dL (ref 30.0–36.0)
MCV: 92.8 fL (ref 80.0–100.0)
Platelets: 236 10*3/uL (ref 150–400)
RBC: 4.75 MIL/uL (ref 4.22–5.81)
RDW: 13.5 % (ref 11.5–15.5)
WBC: 8.8 10*3/uL (ref 4.0–10.5)
nRBC: 0.2 % (ref 0.0–0.2)

## 2020-11-02 MED ORDER — POTASSIUM CHLORIDE CRYS ER 20 MEQ PO TBCR
40.0000 meq | EXTENDED_RELEASE_TABLET | Freq: Once | ORAL | Status: AC
  Administered 2020-11-02: 40 meq via ORAL
  Filled 2020-11-02: qty 2

## 2020-11-02 NOTE — ED Provider Notes (Signed)
75 year old male received at sign out from [place] pending MRI. Per his HPI:   "[name] is a 75 y.o. male.   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  He noticed it when he woke up.  During the day today he was unable to drink liquid because of a come out of the right side of his mouth.  Patient denies signs of stroke including: facial droop, slurred speech, aphasia, weakness/numbness in extremities, imbalance/trouble walking.  No headache or confusion.  No vomiting.  He was concerned about recurrent stroke, prompting emergency visit tonight.  The onset of this condition was acute. The course is constant. Aggravating factors: none. Alleviating factors: none." Physical Exam  BP (!) 150/79   Pulse (!) 56   Temp 98.6 F (37 C) (Oral)   Resp (!) 6   SpO2 98%   Physical Exam Vitals and nursing note reviewed.  Constitutional:      Appearance: He is well-developed. He is obese.  HENT:     Head: Normocephalic and atraumatic.  Eyes:     Comments: Purulent drainage and tearing noted from the right eye.  No left eye drainage or tearing.  Bilateral eyes are injected, right greater than left.  Patient is able to fully close both eyes.  Pulmonary:     Effort: Pulmonary effort is normal.  Musculoskeletal:     Cervical back: Neck supple.  Neurological:     Mental Status: He is alert and oriented to person, place, and time.     Cranial Nerves: Facial asymmetry present. No cranial nerve deficit.  Psychiatric:        Behavior: Behavior normal.    ED Course/Procedures     Procedures  MDM   75 year old male received at signout from [place] pending MRI.  Please see his note for further work-up and medical decision making.  MRI results of been reviewed and independently interpreted by me.  No evidence of  acute changes.  Suspect Bell's palsy.  We will start the patient on prednisone and valacyclovir.  My evaluation, patient is agreeable with this plan.  However, he does have purulent discharge and tearing noted from the right eye with injection of the bilateral eyes, right greater than left.  We will cover the patient with ofloxacin since he does have a history of dry eyes at baseline.  Home supportive care for Bell's palsy has been discussed.  ER return precautions given.  He is hemodynamically stable in no acute distress.  Safer discharge home with outpatient follow-up as discussed.    Joanne Gavel, PA-C [phone] [id number]    Delora Fuel, MD [phone]

## 2020-11-02 NOTE — ED Provider Notes (Signed)
[facility] EMERGENCY DEPARTMENT Provider Note   CSN: [id number] Arrival date & time: 11/02/20  1909     History Chief Complaint  Patient presents with   Facial Droop    [name] is a 75 y.o. male.  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  He noticed it when he woke up.  During the day today he was unable to drink liquid because of a come out of the right side of his mouth.  Patient denies signs of stroke including: facial droop, slurred speech, aphasia, weakness/numbness in extremities, imbalance/trouble walking.  No headache or confusion.  No vomiting.  He was concerned about recurrent stroke, prompting emergency visit tonight.  The onset of this condition was acute. The course is constant. Aggravating factors: none. Alleviating factors: none.        Past Medical History:  Diagnosis Date   Anemia 07/29/2013   Asthma    childhood asthma   Benign essential HTN    Benign prostatic hypertrophy    several prostate biopsies neg for cancer.    Bilateral hip pain    Cancer (HCC)    CHF (congestive heart failure) (HCC)    Chronic anticoagulation [id number]   Complication of anesthesia    MORPHINE CAUSES HALLUCINATIONS   CVA (cerebral vascular accident) ([place]) 06/2016   Depression    Diastolic congestive heart failure ([place]) 07/16/2013   Dysphagia, post-stroke    Embolic cerebral infarction ([place]) 06/21/2016   FH: colonic polyps 2010   Gout    History of kidney stones    Hypertension    Kidney cysts    Morbid obesity ([place])    Numbness    feet / legs   PAF (paroxysmal atrial fibrillation) with RVR 07/29/2013   Pericarditis 2015   s/p window   Recurrent pulmonary emboli ([place]) 06/13/2015   Sleep apnea    USES C PAP   Stroke syndrome    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     Patient Active Problem List   Diagnosis Date Noted   Insomnia 09/27/2020   Acute foot pain, left 11/14/2018   Type II diabetes mellitus with manifestations ([place]) 11/14/2018   B12 deficiency 05/16/2018   Lumbosacral spondylosis without myelopathy 12/18/2017   Vocal fold paralysis, 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   Vocal cord dysfunction 11/22/2016   Benign essential HTN    Wallenberg syndrome [id number]   Embolic cerebral infarction ([place]) 06/21/2016   OSA (obstructive sleep apnea)    Dysphagia, post-stroke    Neurologic gait disorder    Encounter for monitoring flecainide therapy 04/28/2016   Recurrent pulmonary emboli ([place]) 06/13/2015   GERD (gastroesophageal reflux disease) 06/13/2015   Basal cell carcinoma of skin 06/28/2014   Periodic limb movement sleep disorder 06/19/2014   PAF (paroxysmal atrial fibrillation) ([place]) 07/29/2013   Chronic anticoagulation 07/22/2013   Gastric ulcer with hemorrhage 07/20/13 [id number]   Diastolic congestive heart failure ([place]) 07/16/2013   H/O laparoscopic adjustable gastric banding & hiatal hernia repair 04/08/13 04/23/2013   Morbid obesity ([place]) 08/09/2012   Peripheral neuropathy ([place]) 11/02/2009   Hyperlipidemia with target LDL less than 130 09/10/2008   GOUT 06/28/2006   BPH (benign prostatic hyperplasia) 06/28/2006    Past Surgical History:  Procedure Laterality Date   ARTHROSCOPY KNEE W/ DRILLING     BREATH TEK H PYLORI N/A 12/23/2012   Procedure: BREATH TEK  Kandis Ban;  Surgeon: Gayland Curry, MD;  Location: [facility];  Service: General;  Laterality: N/A;   CHOLECYSTECTOMY  1989   COLONOSCOPY N/A 07/31/2013   Procedure: COLONOSCOPY;  Surgeon: Gatha Mayer, MD;  Location: [place];  Service: Endoscopy;  Laterality: N/A;   ESOPHAGOGASTRODUODENOSCOPY N/A 07/22/2013   Procedure: ESOPHAGOGASTRODUODENOSCOPY (EGD);  Surgeon: Irene Shipper, MD;  Location: [facility] ENDOSCOPY;  Service: Endoscopy;  Laterality: N/A;   ESOPHAGOGASTRODUODENOSCOPY N/A 07/31/2013    Procedure: ESOPHAGOGASTRODUODENOSCOPY (EGD);  Surgeon: Gatha Mayer, MD;  Location: [facility] ENDOSCOPY;  Service: Endoscopy;  Laterality: N/A;   INTRAOPERATIVE TRANSESOPHAGEAL ECHOCARDIOGRAM N/A 07/18/2013   Procedure: INTRAOPERATIVE TRANSESOPHAGEAL ECHOCARDIOGRAM;  Surgeon: Grace Isaac, MD;  Location: [place];  Service: Open Heart Surgery;  Laterality: N/A;   LAPAROSCOPIC GASTRIC BANDING WITH HIATAL HERNIA REPAIR N/A 04/08/2013   Procedure: LAPAROSCOPIC GASTRIC BANDING WITH possible  HIATAL HERNIA REPAIR;  Surgeon: Gayland Curry, MD;  Location: [facility] ORS;  Service: General;  Laterality: N/A;   LITHOTRIPSY     renal calculi     SUBXYPHOID PERICARDIAL WINDOW N/A 07/18/2013   Procedure: SUBXYPHOID PERICARDIAL WINDOW;  Surgeon: Grace Isaac, MD;  Location: [facility] OR;  Service: Thoracic;  Laterality: N/A;   TONSILLECTOMY     total knee replacment     bilat   trigger finger repair     also lue, cts surgically repaired   WISDOM TOOTH EXTRACTION         Family History  Problem Relation Age of Onset   Depression Mother    Hypertension Mother    Dementia Mother    Heart disease Father        MI   Depression Brother    Stroke Paternal Aunt        CVA   Diabetes Paternal Uncle    Heart disease Paternal Uncle        MI   Heart disease Paternal Grandmother        MI   Diabetes Cousin        PATERNAL    Social History   Tobacco Use   Smoking status: Former    Packs/day: 0.20    Years: 13.00    Pack years: 2.60    Types: Cigarettes    Start date: 04/03/1961    Quit date: 08/30/1974    Years since quitting: 46.2   Smokeless tobacco: Never  Vaping Use   Vaping Use: Never used  Substance Use Topics   Alcohol use: No    Alcohol/week: 0.0 standard drinks   Drug use: No    Home Medications Prior to Admission medications   Medication Sig Start Date End Date Taking? Authorizing Provider  apixaban (ELIQUIS) 5 MG TABS tablet Take 1 tablet (5 mg total) by mouth 2 (two) times daily.  08/25/20   Janith Lima, MD  atorvastatin (LIPITOR) 80 MG tablet Take 1 tablet (80 mg total) by mouth daily. 08/12/20   Janith Lima, MD  [facility]S MELATONIN GUMMIES PO Take by mouth.    [provider]  Docusate Sodium (DSS) 100 MG CAPS Take 100 mg by mouth every other day. 07/25/13   Kelvin Cellar, MD  finasteride (PROSCAR) 5 MG tablet Take 1 tablet (5 mg total) by mouth daily. 07/12/16   Angiulli, Lavon Paganini, PA-C  flecainide (TAMBOCOR) 50 MG tablet Take 1 tablet (50 mg total) by mouth every 12 (twelve) hours. 06/10/20   [name], [name], MD  loratadine (CLARITIN) 10 MG tablet Take  10 mg by mouth daily.    [provider]  LORazepam (ATIVAN) 0.5 MG tablet Take 1 tablet (0.5 mg total) by mouth at bedtime. 05/06/20 05/06/21  Arfeen, Arlyce Harman, MD  methylPREDNISolone (MEDROL DOSEPAK) 4 MG TBPK tablet See admin instructions. follow package directions 08/20/20   [provider]  montelukast (SINGULAIR) 10 MG tablet TAKE 1 TABLET(10 MG) BY MOUTH AT BEDTIME 08/08/20   Janith Lima, MD  Multiple Vitamin (MULTIVITAMIN WITH MINERALS) TABS tablet Take 1 tablet by mouth daily.    [provider]  pantoprazole (PROTONIX) 40 MG tablet TAKE 1 TABLET(40 MG) BY MOUTH DAILY 07/28/20   Janith Lima, MD  potassium chloride SA (KLOR-CON) 20 MEQ tablet TAKE 1 TABLET BY MOUTH TWICE DAILY 06/10/20   [name], [name], MD  rOPINIRole (REQUIP) 0.5 MG tablet Take 1 tablet (0.5 mg total) by mouth 3 (three) times daily. 10/26/20   Baird Lyons D, MD  sertraline (ZOLOFT) 100 MG tablet Take 1.5 tablets (150 mg total) by mouth at bedtime. 05/06/20   Arfeen, Arlyce Harman, MD  torsemide (DEMADEX) 20 MG tablet TAKE 3 TABLETS BY MOUTH ONCE DAILY. 06/10/20   [name], [name], MD  torsemide (DEMADEX) 20 MG tablet daily. 02/14/17   [provider]  traMADol (ULTRAM) 50 MG tablet Take 1 tablet (50 mg total) by mouth every 8 (eight) hours as needed. 07/29/20   Kirsteins, Luanna Salk, MD  traZODone (DESYREL) 50 MG  tablet 1 to 3 tabs at bedtime for sleep as needed 07/19/20   Baird Lyons D, MD    Allergies    Morphine, Codeine, Penicillins, and Propoxyphene n-acetaminophen  Review of Systems   Review of Systems  Constitutional:  Negative for fever.  HENT:  Negative for congestion, dental problem, rhinorrhea and sinus pressure.   Eyes:  Negative for photophobia, discharge, redness and visual disturbance.  Respiratory:  Negative for shortness of breath.   Cardiovascular:  Negative for chest pain.  Gastrointestinal:  Negative for nausea and vomiting.  Musculoskeletal:  Positive for neck pain. Negative for gait problem and neck stiffness.  Skin:  Negative for rash.  Neurological:  Positive for facial asymmetry. Negative for syncope, speech difficulty, weakness, light-headedness, numbness and headaches.  Psychiatric/Behavioral:  Negative for confusion.    Physical Exam Updated Vital Signs BP (!) 147/71 (BP Location: Right Arm)   Pulse 78   Temp 98.6 F (37 C) (Oral)   Resp (!) 22   SpO2 95%   Physical Exam Vitals and nursing note reviewed.  Constitutional:      Appearance: He is well-developed.  HENT:     Head: Normocephalic and atraumatic. No raccoon eyes or Battle's sign.     Right Ear: External ear normal. No hemotympanum.     Left Ear: Tympanic membrane and external ear normal. No hemotympanum.     Nose: Nose normal.  Eyes:     General: Lids are normal.     Conjunctiva/sclera: Conjunctivae normal.     Pupils: Pupils are equal, round, and reactive to light.     Comments: No visible hyphema  Cardiovascular:     Rate and Rhythm: Normal rate and regular rhythm.  Pulmonary:     Effort: Pulmonary effort is normal.     Breath sounds: Normal breath sounds.  Abdominal:     Palpations: Abdomen is soft.     Tenderness: There is no abdominal tenderness.  Musculoskeletal:        General: Normal range of motion.  Cervical back: Normal range of motion and neck supple. No tenderness or  bony tenderness.     Thoracic back: No tenderness or bony tenderness.     Lumbar back: No tenderness or bony tenderness.  Skin:    General: Skin is warm and dry.  Neurological:     Mental Status: He is alert and oriented to person, place, and time.     GCS: GCS eye subscore is 4. GCS verbal subscore is 5. GCS motor subscore is 6.     Cranial Nerves: Cranial nerve deficit and facial asymmetry present. No dysarthria.     Sensory: No sensory deficit.     Coordination: Coordination normal.     Deep Tendon Reflexes: Reflexes are normal and symmetric.    ED Results / Procedures / Treatments   Labs (all labs ordered are listed, but only abnormal results are displayed) Labs Reviewed  COMPREHENSIVE METABOLIC PANEL - Abnormal; Notable for the following components:      Result Value   Potassium 2.8 (*)    Glucose, Bld 155 (*)    Calcium 8.2 (*)    Total Protein 6.2 (*)    Albumin 3.1 (*)    All other components within normal limits  CBC  DIFFERENTIAL  URINALYSIS, ROUTINE W REFLEX MICROSCOPIC    ED ECG REPORT   Date: 11/02/2020  Rate: 73  Rhythm: normal sinus rhythm  QRS Axis: right  Intervals: normal  ST/T Wave abnormalities: normal  Conduction Disutrbances:none  Narrative Interpretation:   Old EKG Reviewed: unchanged  I have personally reviewed the EKG tracing and agree with the computerized printout as noted.   Radiology No results found.  Procedures Procedures   Medications Ordered in ED Medications - No data to display  ED Course  I have reviewed the triage vital signs and the nursing notes.  Pertinent labs & imaging results that were available during my care of the patient were reviewed by me and considered in my medical decision making (see chart for details).  Patient seen and examined. Work-up initiated. CT ordered -- changed to MRI brain. No code stroke as patient out of window.   Vital signs reviewed and are as follows: BP (!) 147/71 (BP Location: Right  Arm)   Pulse 78   Temp 98.6 F (37 C) (Oral)   Resp (!) 22   SpO2 95%   11:20 PM pending MRI.  Patient discussed with and seen by Dr. Laverta Baltimore.  Signed out to [employer] at shift change. Plan: MRI negative, discharged home with treatment for Bell's palsy.  If stroke, admit.    MDM Rules/Calculators/A&P                           Pending completion of work-up.    Final Clinical Impression(s) / ED Diagnoses Final diagnoses:  None    Rx / DC Orders ED Discharge Orders     None        Carlisle Cater, PA-C 11/02/20 2321    Margette Fast, MD 11/03/20 1125

## 2020-11-02 NOTE — ED Provider Notes (Signed)
Emergency Medicine Provider Triage Evaluation Note  [name] , a 75 y.o. male  was evaluated in triage.  Pt complains of right facial droop onset this am. Lnw last night before he went to bed. No associated numbness.  Review of Systems  Positive: Facial droop Negative: weakness  Physical Exam  BP (!) 154/90 (BP Location: Left Arm)   Pulse 79   Temp 98.6 F (37 C) (Oral)   Resp (!) 22   SpO2 96%  Gen:   Awake, no distress   Resp:  Normal effort  MSK:   Moves extremities without difficulty  Other:  Right facial droop, 5/5 strength to the bue/ble  Medical Decision Making  Medically screening exam initiated at 7:22 PM.  Appropriate orders placed.  [name] was informed that the remainder of the evaluation will be completed by another provider, this initial triage assessment does not replace that evaluation, and the importance of remaining in the ED until their evaluation is complete.  Pt here with facial droop. Possibly related to bells palsy but will order stroke w/u to rule this out given age, risk factors and use of anticoagulation   Bishop Dublin 11/02/20 1932    Blanchie Dessert, MD 11/02/20 2141

## 2020-11-02 NOTE — ED Triage Notes (Addendum)
Pt reports onset of right sided facial droop and numbness starting at 7:30 am. Has HX stroke. Takes eliquis.   In triage pt is A&Ox4, able to follow commands with no drift, no numbness on extremities, and no aphasia.

## 2020-11-03 ENCOUNTER — Emergency Department (HOSPITAL_COMMUNITY): Payer: Medicare Other

## 2020-11-03 ENCOUNTER — Telehealth: Payer: Self-pay | Admitting: Internal Medicine

## 2020-11-03 DIAGNOSIS — R29810 Facial weakness: Secondary | ICD-10-CM | POA: Diagnosis not present

## 2020-11-03 DIAGNOSIS — G510 Bell's palsy: Secondary | ICD-10-CM | POA: Diagnosis not present

## 2020-11-03 MED ORDER — PREDNISONE 10 MG PO TABS: ORAL_TABLET | ORAL | 0 refills | Status: AC

## 2020-11-03 MED ORDER — FLECAINIDE ACETATE 50 MG PO TABS
50.0000 mg | ORAL_TABLET | Freq: Two times a day (BID) | ORAL | Status: DC
  Administered 2020-11-03: 50 mg via ORAL
  Filled 2020-11-03 (×2): qty 1

## 2020-11-03 MED ORDER — OFLOXACIN 0.3 % OP SOLN: 1.0000 [drp] | Freq: Four times a day (QID) | OPHTHALMIC | 0 refills | Status: DC

## 2020-11-03 MED ORDER — APIXABAN 5 MG PO TABS
5.0000 mg | ORAL_TABLET | Freq: Two times a day (BID) | ORAL | Status: DC
  Administered 2020-11-03: 5 mg via ORAL
  Filled 2020-11-03: qty 1

## 2020-11-03 MED ORDER — VALACYCLOVIR HCL 1 G PO TABS: 1000.0000 mg | ORAL_TABLET | Freq: Three times a day (TID) | ORAL | 0 refills | Status: AC

## 2020-11-03 NOTE — ED Notes (Signed)
Called [facility] to transport patient back home.

## 2020-11-03 NOTE — Discharge Instructions (Addendum)
Thank you for allowing me to care for you today in the Emergency Department.   Starting tomorrow, take 60 mg of prednisone for 5 days, then take 50 mg for one day, 40 mg for one day. 30 mg for one day, 20 mg for one day, and then 10 mg for one day.  Take valacyclovir every 8 hours for 7 days.  Insert 1 drop of ofloxacin in the right eye every 6 hours for the next 3-5 days or until the thick drainage of your right eye resolves.   You can apply a piece of take to fully close the right eye until you can sleep with your eye fully closed. Use eyes drops if your eyes are dry until you can full close your eye.  Follow up with primary care, especially if your symptoms are not starting to improve in the next 3 weeks.  Return to the emergency department if you have new numbness or weakness, visual changes, or other new concerning symptoms.

## 2020-11-03 NOTE — ED Notes (Signed)
Patient transported to MRI 

## 2020-11-09 ENCOUNTER — Ambulatory Visit (INDEPENDENT_AMBULATORY_CARE_PROVIDER_SITE_OTHER): Payer: Medicare Other | Admitting: Internal Medicine

## 2020-11-09 ENCOUNTER — Other Ambulatory Visit: Payer: Self-pay

## 2020-11-09 ENCOUNTER — Encounter: Payer: Self-pay | Admitting: Internal Medicine

## 2020-11-09 VITALS — BP 132/82 | HR 75 | Temp 98.2°F | Resp 20 | Ht 72.0 in | Wt 387.0 lb

## 2020-11-09 DIAGNOSIS — E538 Deficiency of other specified B group vitamins: Secondary | ICD-10-CM | POA: Diagnosis not present

## 2020-11-09 DIAGNOSIS — G463 Brain stem stroke syndrome: Secondary | ICD-10-CM

## 2020-11-09 DIAGNOSIS — I10 Essential (primary) hypertension: Secondary | ICD-10-CM

## 2020-11-09 DIAGNOSIS — E118 Type 2 diabetes mellitus with unspecified complications: Secondary | ICD-10-CM | POA: Diagnosis not present

## 2020-11-09 DIAGNOSIS — I700 Atherosclerosis of aorta: Secondary | ICD-10-CM

## 2020-11-09 DIAGNOSIS — R3911 Hesitancy of micturition: Secondary | ICD-10-CM

## 2020-11-09 DIAGNOSIS — E785 Hyperlipidemia, unspecified: Secondary | ICD-10-CM | POA: Diagnosis not present

## 2020-11-09 DIAGNOSIS — E876 Hypokalemia: Secondary | ICD-10-CM | POA: Diagnosis not present

## 2020-11-09 DIAGNOSIS — N401 Enlarged prostate with lower urinary tract symptoms: Secondary | ICD-10-CM

## 2020-11-09 LAB — BASIC METABOLIC PANEL
BUN: 36 mg/dL — ABNORMAL HIGH (ref 6–23)
CO2: 34 mEq/L — ABNORMAL HIGH (ref 19–32)
Calcium: 8.9 mg/dL (ref 8.4–10.5)
Chloride: 99 mEq/L (ref 96–112)
Creatinine, Ser: 1.26 mg/dL (ref 0.40–1.50)
GFR: 56.04 mL/min — ABNORMAL LOW (ref >60.00)
Glucose, Bld: 154 mg/dL — ABNORMAL HIGH (ref 70–99)
Potassium: 3.5 mEq/L (ref 3.5–5.1)
Sodium: 145 mEq/L (ref 135–145)

## 2020-11-09 LAB — URINALYSIS, ROUTINE W REFLEX MICROSCOPIC
Bilirubin Urine: NEGATIVE
Hgb urine dipstick: NEGATIVE
Ketones, ur: NEGATIVE
Leukocytes,Ua: NEGATIVE
Nitrite: NEGATIVE
RBC / HPF: NONE SEEN (ref 0–?)
Specific Gravity, Urine: 1.01 (ref 1.000–1.030)
Total Protein, Urine: NEGATIVE
Urine Glucose: NEGATIVE
Urobilinogen, UA: 0.2 (ref 0.0–1.0)
WBC, UA: NONE SEEN (ref 0–?)
pH: 6 (ref 5.0–8.0)

## 2020-11-09 LAB — LIPID PANEL
Cholesterol: 149 mg/dL (ref 0–200)
HDL: 44.2 mg/dL (ref >39.00)
LDL Cholesterol: 77 mg/dL (ref 0–99)
NonHDL: 104.36
Total CHOL/HDL Ratio: 3
Triglycerides: 139 mg/dL (ref 0.0–149.0)
VLDL: 27.8 mg/dL (ref 0.0–40.0)

## 2020-11-09 LAB — MICROALBUMIN / CREATININE URINE RATIO
Creatinine,U: 15.2 mg/dL
Microalb Creat Ratio: 4.6 mg/g (ref 0.0–30.0)
Microalb, Ur: <0.7 mg/dL (ref 0.0–1.9)

## 2020-11-09 LAB — HEMOGLOBIN A1C: Hgb A1c MFr Bld: 6.5 % (ref 4.6–6.5)

## 2020-11-09 LAB — PSA: PSA: 0.47 ng/mL (ref 0.10–4.00)

## 2020-11-09 LAB — TSH: TSH: 2.43 u[IU]/mL (ref 0.35–5.50)

## 2020-11-09 LAB — FOLATE: Folate: 23 ng/mL (ref >5.9)

## 2020-11-09 LAB — MAGNESIUM: Magnesium: 2 mg/dL (ref 1.5–2.5)

## 2020-11-09 NOTE — Patient Instructions (Signed)

## 2020-11-09 NOTE — Progress Notes (Signed)
Subjective:  Patient ID: [name], male    DOB: 03-21-1946  Age: 75 y.o. MRN: [id number]  CC: Hypertension, Diabetes, and Hyperlipidemia  This visit occurred during the SARS-CoV-2 public health emergency.  Safety protocols were in place, including screening questions prior to the visit, additional usage of staff PPE, and extensive cleaning of exam room while observing appropriate contact time as indicated for disinfecting solutions.    HPI [name] presents for f/up -  He recently developed right facial palsy and was seen in the ED.  CVA was ruled out and he has been treated with prednisone for Bell's palsy.  The symptoms are improving.  He complains of weight gain and a stable degree of lower extremity edema.  He denies chest pain, shortness of breath, palpitations, or diaphoresis.  Outpatient Medications Prior to Visit  Medication Sig Dispense Refill   apixaban (ELIQUIS) 5 MG TABS tablet Take 1 tablet (5 mg total) by mouth 2 (two) times daily. 180 tablet 0   atorvastatin (LIPITOR) 80 MG tablet Take 1 tablet (80 mg total) by mouth daily. 90 tablet 1   [facility] MELATONIN GUMMIES PO Take by mouth.     Docusate Sodium (DSS) 100 MG CAPS Take 100 mg by mouth every other day.     finasteride (PROSCAR) 5 MG tablet Take 1 tablet (5 mg total) by mouth daily. 30 tablet 1   flecainide (TAMBOCOR) 50 MG tablet Take 1 tablet (50 mg total) by mouth every 12 (twelve) hours. 180 tablet 3   loratadine (CLARITIN) 10 MG tablet Take 10 mg by mouth daily.     methylPREDNISolone (MEDROL DOSEPAK) 4 MG TBPK tablet See admin instructions. follow package directions     montelukast (SINGULAIR) 10 MG tablet TAKE 1 TABLET(10 MG) BY MOUTH AT BEDTIME 90 tablet 1   Multiple Vitamin (MULTIVITAMIN WITH MINERALS) TABS tablet Take 1 tablet by mouth daily.     ofloxacin (OCUFLOX) 0.3 % ophthalmic solution Place 1 drop into the right eye 4 (four) times daily. 5 mL 0   pantoprazole (PROTONIX) 40 MG tablet TAKE 1  TABLET(40 MG) BY MOUTH DAILY 90 tablet 1   potassium chloride SA (KLOR-CON) 20 MEQ tablet TAKE 1 TABLET BY MOUTH TWICE DAILY 180 tablet 3   predniSONE (DELTASONE) 10 MG tablet Take 6 tablets (60 mg total) by mouth daily with breakfast for 5 days, THEN 5 tablets (50 mg total) daily with breakfast for 1 day, THEN 4 tablets (40 mg total) daily with breakfast for 1 day, THEN 3 tablets (30 mg total) daily with breakfast for 1 day, THEN 2 tablets (20 mg total) daily with breakfast for 1 day, THEN 1 tablet (10 mg total) daily with breakfast for 1 day. 45 tablet 0   rOPINIRole (REQUIP) 0.5 MG tablet Take 1 tablet (0.5 mg total) by mouth 3 (three) times daily. 270 tablet 2   torsemide (DEMADEX) 20 MG tablet TAKE 3 TABLETS BY MOUTH ONCE DAILY. 270 tablet 3   traMADol (ULTRAM) 50 MG tablet Take 1 tablet (50 mg total) by mouth every 8 (eight) hours as needed. 90 tablet 5   traZODone (DESYREL) 50 MG tablet 1 to 3 tabs at bedtime for sleep as needed (Patient not taking: Reported on 11/10/2020) 50 tablet 3   valACYclovir (VALTREX) 1000 MG tablet Take 1 tablet (1,000 mg total) by mouth 3 (three) times daily for 7 days. 21 tablet 0   LORazepam (ATIVAN) 0.5 MG tablet Take 1 tablet (0.5 mg total) by  mouth at bedtime. 90 tablet 1   sertraline (ZOLOFT) 100 MG tablet Take 1.5 tablets (150 mg total) by mouth at bedtime. 135 tablet 1   torsemide (DEMADEX) 20 MG tablet daily.     No facility-administered medications prior to visit.    ROS Review of Systems  Constitutional:  Positive for unexpected weight change. Negative for chills, diaphoresis and fatigue.  HENT: Negative.    Eyes:  Negative for visual disturbance.  Respiratory:  Negative for cough, chest tightness, shortness of breath and wheezing.   Cardiovascular:  Positive for leg swelling. Negative for chest pain and palpitations.  Gastrointestinal:  Negative for abdominal pain, diarrhea and nausea.  Genitourinary: Negative.  Negative for difficulty urinating and  dysuria.  Musculoskeletal: Negative.  Negative for back pain and myalgias.  Skin: Negative.   Neurological: Negative.  Negative for dizziness, weakness, light-headedness and headaches.  Hematological:  Negative for adenopathy. Does not bruise/bleed easily.   Objective:  BP 132/82 (BP Location: Right Arm, Patient Position: Sitting, Cuff Size: Large)   Pulse 75   Temp 98.2 F (36.8 C) (Oral)   Resp 20   Ht 6' (1.829 m)   Wt (!) 387 lb (175.5 kg)   SpO2 96%   BMI 52.49 kg/m   BP Readings from Last 3 Encounters:  11/09/20 132/82  11/03/20 (!) 148/72  09/03/20 111/72    Wt Readings from Last 3 Encounters:  11/09/20 (!) 387 lb (175.5 kg)  09/03/20 (!) 381 lb 9.6 oz (173.1 kg)  07/29/20 (!) 400 lb 3.2 oz (181.5 kg)    Physical Exam Vitals reviewed.  Constitutional:      Appearance: He is obese.  HENT:     Nose: Nose normal.     Mouth/Throat:     Mouth: Mucous membranes are moist.  Eyes:     Conjunctiva/sclera: Conjunctivae normal.  Cardiovascular:     Rate and Rhythm: Normal rate and regular rhythm.     Heart sounds: No murmur heard. Pulmonary:     Effort: Pulmonary effort is normal.     Breath sounds: No stridor. No wheezing, rhonchi or rales.  Abdominal:     General: Abdomen is protuberant. Bowel sounds are normal. There is no distension.     Palpations: Abdomen is soft. There is no hepatomegaly, splenomegaly or mass.     Tenderness: There is no abdominal tenderness.  Musculoskeletal:        General: Normal range of motion.     Cervical back: Neck supple.     Right lower leg: 1+ Edema present.     Left lower leg: 1+ Edema present.  Lymphadenopathy:     Cervical: No cervical adenopathy.  Skin:    General: Skin is warm and dry.  Neurological:     General: No focal deficit present.     Mental Status: He is alert.  Psychiatric:        Mood and Affect: Mood normal.        Behavior: Behavior normal.    Lab Results  Component Value Date   WBC 8.8 11/02/2020    HGB 14.2 11/02/2020   HCT 44.1 11/02/2020   PLT 236 11/02/2020   GLUCOSE 154 (H) 11/09/2020   CHOL 149 11/09/2020   TRIG 139.0 11/09/2020   HDL 44.20 11/09/2020   LDLDIRECT 91.0 05/16/2018   LDLCALC 77 11/09/2020   ALT 17 11/02/2020   AST 26 11/02/2020   NA 145 11/09/2020   K 3.5 11/09/2020   CL 99 11/09/2020  CREATININE 1.26 11/09/2020   BUN 36 (H) 11/09/2020   CO2 34 (H) 11/09/2020   TSH 2.43 11/09/2020   PSA 0.47 11/09/2020   INR 1.09 06/19/2016   HGBA1C 6.5 11/09/2020   MICROALBUR <0.7 11/09/2020    MR BRAIN WO CONTRAST  Result Date: 11/03/2020 CLINICAL DATA:  Facial droop EXAM: MRI HEAD WITHOUT CONTRAST TECHNIQUE: Multiplanar, multiecho pulse sequences of the brain and surrounding structures were obtained without intravenous contrast. COMPARISON:  06/19/2016 FINDINGS: Brain: No acute infarct, mass effect or extra-axial collection. No acute or chronic hemorrhage. Normal white matter signal. Generalized volume loss without a clear lobar predilection. The midline structures are normal. Vascular: Major flow voids are preserved. Skull and upper cervical spine: Normal calvarium and skull base. Visualized upper cervical spine and soft tissues are normal. Sinuses/Orbits:No paranasal sinus fluid levels or advanced mucosal thickening. No mastoid or middle ear effusion. Normal orbits. IMPRESSION: 1. No acute intracranial abnormality. 2. Generalized volume loss without a clear lobar predilection. Electronically Signed   By: Ulyses Jarred M.D.   On: 11/03/2020 01:24    Assessment & Plan:   Gumercindo was seen today for hypertension, diabetes and hyperlipidemia.  Diagnoses and all orders for this visit:  Benign essential HTN- His blood pressure is adequately well controlled. -     Basic metabolic panel; Future -     TSH; Future -     Urinalysis, Routine w reflex microscopic; Future -     Microalbumin / creatinine urine ratio; Future -     Microalbumin / creatinine urine ratio -      Urinalysis, Routine w reflex microscopic -     TSH -     Basic metabolic panel  Type II diabetes mellitus with manifestations ([place])- His A1c is 6.5%.  His blood sugars are adequately well controlled. -     Basic metabolic panel; Future -     Hemoglobin A1c; Future -     Microalbumin / creatinine urine ratio; Future -     HM Diabetes Foot Exam -     Microalbumin / creatinine urine ratio -     Hemoglobin A1c -     Basic metabolic panel  123456 deficiency- He will continue B12 replacement therapy. -     Folate; Future -     Folate  Benign prostatic hyperplasia with urinary hesitancy- His PSA is reassuring. -     Urinalysis, Routine w reflex microscopic; Future -     PSA; Future -     PSA -     Urinalysis, Routine w reflex microscopic  Hyperlipidemia with target LDL less than 130- LDL goal achieved. Doing well on the statin  -     Lipid panel; Future -     TSH; Future -     TSH -     Lipid panel  Chronic hypokalemia -     Basic metabolic panel; Future -     Magnesium; Future -     Magnesium -     Basic metabolic panel  Atherosclerosis of aorta ([place])- Risk factor modifications addressed.  I am having Irma A. Brune "[name]" maintain his DSS, loratadine, finasteride, multivitamin with minerals, CVS MELATONIN GUMMIES PO, potassium chloride SA, torsemide, flecainide, traZODone, pantoprazole, traMADol, montelukast, atorvastatin, Eliquis, methylPREDNISolone, rOPINIRole, predniSONE, and ofloxacin.  No orders of the defined types were placed in this encounter.    Follow-up: Return in about 6 months (around 05/12/2021).  [name], MD

## 2020-11-10 ENCOUNTER — Telehealth (INDEPENDENT_AMBULATORY_CARE_PROVIDER_SITE_OTHER): Payer: Medicare Other | Admitting: Psychiatry

## 2020-11-10 ENCOUNTER — Encounter (HOSPITAL_COMMUNITY): Payer: Self-pay | Admitting: Psychiatry

## 2020-11-10 DIAGNOSIS — F419 Anxiety disorder, unspecified: Secondary | ICD-10-CM

## 2020-11-10 DIAGNOSIS — F330 Major depressive disorder, recurrent, mild: Secondary | ICD-10-CM | POA: Diagnosis not present

## 2020-11-10 DIAGNOSIS — I700 Atherosclerosis of aorta: Secondary | ICD-10-CM | POA: Insufficient documentation

## 2020-11-10 MED ORDER — SERTRALINE HCL 100 MG PO TABS: 150.0000 mg | ORAL_TABLET | Freq: Every day | ORAL | 1 refills | Status: DC

## 2020-11-10 MED ORDER — LORAZEPAM 0.5 MG PO TABS: 0.5000 mg | ORAL_TABLET | Freq: Every day | ORAL | 1 refills | Status: DC

## 2020-11-10 NOTE — Progress Notes (Signed)
Virtual Visit via Telephone Note  I connected with [name] on 11/10/20 at  2:00 PM EDT by telephone and verified that I am speaking with the correct person using two identifiers.  Location: Patient: Home Provider: Home Office   I discussed the limitations, risks, security and privacy concerns of performing an evaluation and management service by telephone and the availability of in person appointments. I also discussed with the patient that there may be a patient responsible charge related to this service. The patient expressed understanding and agreed to proceed.   History of Present Illness: Patient is evaluated by phone session.  Recently he had a visit to the emergency room because of Bell's palsy.  He is taking steroids and he noticed slowly and gradually getting better.  He reported his anxiety and depression is a stable.  He is also taking Requip for restless leg.  His sleep is okay with the help of BiPAP.  His pulmonologist has given the trazodone but he has not taken it after reading the side effects.  He does not feel that he needs more medication since he feels stable.  He enjoys the company of his 75-year-old grandchild.  Patient reported his family life is good.  He lives with his wife.  His son lives close by who is busy in his business.  Patient has chronic health issues but they are stable.  He denies any crying spells, feeling of hopelessness or worthlessness.  Since he had knee surgery he has been slowly and gradually recovering and now he feels pain has subsided a lot.  He had blood work recently.  His hemoglobin A1c is 6.5 which is stable.     Past Psychiatric History: Reviewed H/O inpatient in 2006 for depression and having suicidal thoughts. Took Risperdal but d/c after stroke in March 2018.  Psychiatric Specialty Exam: Physical Exam  Review of Systems  Weight (!) 387 lb (175.5 kg).There is no height or weight on file to calculate BMI.  General Appearance: NA   Eye Contact:  NA  Speech:  Slow  Volume:  Decreased  Mood:  Euthymic  Affect:  NA  Thought Process:  Goal Directed  Orientation:  Full (Time, Place, and Person)  Thought Content:  WDL  Suicidal Thoughts:  No  Homicidal Thoughts:  No  Memory:  Immediate;   Good Recent;   Fair Remote;   Fair  Judgement:  Intact  Insight:  Present  Psychomotor Activity:  NA  Concentration:  Concentration: Fair and Attention Span: Fair  Recall:  [employer] of Knowledge:  Good  Language:  Good  Akathisia:  No  Handed:  Right  AIMS (if indicated):     Assets:  Communication Skills Desire for Improvement Housing Resilience Social Support  ADL's:  Intact  Cognition:  WNL  Sleep:   ok with bipap      Assessment and Plan: Major depressive disorder, recurrent.  Anxiety.  Patient is stable on his current medication.  I reviewed blood work results.  He is now taking trazodone which was prescribed by his pulmonologist.  However he does take Requip prescribed by his other physician to help his restless leg.  He wants to keep the sertraline 150 mg daily and lorazepam 0.5 mg at bedtime.  Recommended to call us back if is any question or any concern.  Follow-up in 6 months.  Follow Up Instructions:    I discussed the assessment and treatment plan with the patient. The patient was provided  an opportunity to ask questions and all were answered. The patient agreed with the plan and demonstrated an understanding of the instructions.   The patient was advised to call back or seek an in-person evaluation if the symptoms worsen or if the condition fails to improve as anticipated.  I provided 13 minutes of non-face-to-face time during this encounter.   Kathlee Nations, MD

## 2020-11-12 ENCOUNTER — Telehealth: Payer: Self-pay | Admitting: Pharmacist

## 2020-11-12 NOTE — Progress Notes (Signed)
Contacted [employer]  to follow up on patient assistance application for Eliquis. Per representative at [facility] states patient was denied due to not meeting out of pocket expense. Patient needed to spend $86.85 more in prescriptions then he will meet income guidelines. Representative also stated some new documentation came in and they will send it back through processing.  Orinda Kenner, [name] Clinical Pharmacists Assistant [phone]  Time Spent: [phone]

## 2020-11-18 NOTE — Progress Notes (Signed)
Contacted [employer]  to follow up on patient assistance application for Eliquis. Per representative at [facility] states patient has been approved starting  November 15, 2020 - April 02, 2021.   Orinda Kenner, [place] Clinical Pharmacists Assistant [phone]  Time Spent: [phone]

## 2020-11-29 ENCOUNTER — Other Ambulatory Visit: Payer: Self-pay | Admitting: Internal Medicine

## 2020-11-29 DIAGNOSIS — I480 Paroxysmal atrial fibrillation: Secondary | ICD-10-CM

## 2020-12-01 ENCOUNTER — Telehealth: Payer: Self-pay | Admitting: Internal Medicine

## 2020-12-03 ENCOUNTER — Telehealth: Payer: Self-pay | Admitting: Pharmacist

## 2020-12-03 NOTE — Progress Notes (Signed)
Chronic Care Management Pharmacy Assistant   Name: [name]  MRN: [id number] DOB: 07/10/1945   Reason for Encounter: Disease State   Conditions to be addressed/monitored: CHF   Recent office visits:  11/08/20 Janith Lima, MD (PCP)Benign essential HTN  Return in about 6 months   Recent consult visits:  None ID  Hospital visits:  Medication Reconciliation was completed by comparing discharge summary, patient's EMR and Pharmacy list, and upon discussion with patient.  Admitted to the hospital on 11/02/20 due to Bell's palsy. Discharge date was 11/03/20. Discharged from [facility] ED  New?Medications Started at [facility] Discharge:?? -ofloxacin (OCUFLOX) 0.3 % ophthalmic solution  Prednisone 10 mg  Valacyclovir HCI 1000 mg  Medications that remain the same after Hospital Discharge:??  -All other medications will remain the same.    Medications: Outpatient Encounter Medications as of 12/03/2020  Medication Sig Note   atorvastatin (LIPITOR) 80 MG tablet Take 1 tablet (80 mg total) by mouth daily.    [facility] MELATONIN GUMMIES PO Take by mouth.    Docusate Sodium (DSS) 100 MG CAPS Take 100 mg by mouth every other day.    ELIQUIS 5 MG TABS tablet TAKE 1 TABLET(5 MG) BY MOUTH TWICE DAILY    finasteride (PROSCAR) 5 MG tablet Take 1 tablet (5 mg total) by mouth daily.    flecainide (TAMBOCOR) 50 MG tablet Take 1 tablet (50 mg total) by mouth every 12 (twelve) hours.    loratadine (CLARITIN) 10 MG tablet Take 10 mg by mouth daily.    LORazepam (ATIVAN) 0.5 MG tablet Take 1 tablet (0.5 mg total) by mouth at bedtime.    methylPREDNISolone (MEDROL DOSEPAK) 4 MG TBPK tablet See admin instructions. follow package directions    montelukast (SINGULAIR) 10 MG tablet TAKE 1 TABLET(10 MG) BY MOUTH AT BEDTIME    Multiple Vitamin (MULTIVITAMIN WITH MINERALS) TABS tablet Take 1 tablet by mouth daily.    ofloxacin (OCUFLOX) 0.3 % ophthalmic solution Place 1 drop into the right  eye 4 (four) times daily.    pantoprazole (PROTONIX) 40 MG tablet TAKE 1 TABLET(40 MG) BY MOUTH DAILY    potassium chloride SA (KLOR-CON) 20 MEQ tablet TAKE 1 TABLET BY MOUTH TWICE DAILY    rOPINIRole (REQUIP) 0.5 MG tablet Take 1 tablet (0.5 mg total) by mouth 3 (three) times daily.    sertraline (ZOLOFT) 100 MG tablet Take 1.5 tablets (150 mg total) by mouth at bedtime.    torsemide (DEMADEX) 20 MG tablet TAKE 3 TABLETS BY MOUTH ONCE DAILY.    traMADol (ULTRAM) 50 MG tablet Take 1 tablet (50 mg total) by mouth every 8 (eight) hours as needed. 09/03/2020: LF 07/29/20 #90 TODAY #65 LD 09/03/20   traZODone (DESYREL) 50 MG tablet 1 to 3 tabs at bedtime for sleep as needed (Patient not taking: Reported on 11/10/2020)    No facility-administered encounter medications on file as of 12/03/2020.   Contacted patient on 12/03/20 to discuss CHF disease state   Current heart failure regimen: Torsemide 20 mg x 3 daily - takes 4-5 days per week Potassium 20 mEq BID  Are you taking a diuretic? Yes How often: Takes 3 times a week to help keep fluid from around heart  Do you weigh yourself daily or regularly? No  Have any of the following symptoms worsened or changed from your baseline?   unusual swelling in legs, feet, abdomen or hands  Do you see a cardiologist? Yes  Name:Dr.[name] Last visit? 06/10/20 Upcoming visit?9/  How often are you checking your Blood Pressure? Patient states that he does not have blood pressure issues    Star Rating Drugs:  Medication:  Last Fill: Day Supply Atorvastatin 80 mg 11/20/20 90  Care Gaps: Last annual wellness visit:None noted  CCM appointment on 03/16/21    [place] Pharmacist Assistant [phone]   Time spent:33

## 2020-12-04 NOTE — Telephone Encounter (Signed)
[place] Eliquis   [id number]

## 2020-12-07 ENCOUNTER — Encounter: Payer: Medicare Other | Attending: Physical Medicine & Rehabilitation | Admitting: Physical Medicine & Rehabilitation

## 2020-12-07 ENCOUNTER — Encounter: Payer: Self-pay | Admitting: Physical Medicine & Rehabilitation

## 2020-12-07 ENCOUNTER — Other Ambulatory Visit: Payer: Self-pay

## 2020-12-07 VITALS — BP 152/79 | HR 80 | Ht 72.0 in | Wt 387.0 lb

## 2020-12-07 DIAGNOSIS — G463 Brain stem stroke syndrome: Secondary | ICD-10-CM | POA: Diagnosis not present

## 2020-12-07 DIAGNOSIS — M48061 Spinal stenosis, lumbar region without neurogenic claudication: Secondary | ICD-10-CM | POA: Insufficient documentation

## 2020-12-07 NOTE — Progress Notes (Signed)
Subjective:    Patient ID: [name], male    DOB: 31-Jan-1946, 75 y.o.   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  CLINICAL DATA:  75 year old male with increased chronic back pain, right side pain, incontinence.   EXAM: MRI LUMBAR SPINE WITHOUT CONTRAST   TECHNIQUE: Multiplanar, multisequence MR imaging of the lumbar spine was performed. No intravenous contrast was administered.   COMPARISON:  Lumbar radiographs 11/07/2016.   FINDINGS: Segmentation:  Normal on the comparison radiographs.   Alignment: Mild retrolisthesis of L4 on L5 measures up to 5 millimeters and is new from the prior radiographs. Stable straightening of lumbar lordosis otherwise.   Vertebrae: Benign vertebral body hemangioma in L4. No marrow edema or evidence of acute osseous abnormality. Normal background bone marrow signal. Intact visible sacrum and SI joints.   Conus medullaris and cauda equina: Conus extends to the L1 level. No lower spinal cord or conus signal abnormality. Lipoma of the filum terminalis, normal variant (series 9, image 11).   Paraspinal and other soft tissues: Partially visible benign appearing renal cysts. Negative visible other abdominal viscera. Negative visualized posterior paraspinal soft tissues.   Disc levels:   T11-T12: Partially visible, negative.   T12-L1:  Minimal disc bulge.   L1-L2:  Negative.   L2-L3:  Minimal disc bulge.  Mild facet hypertrophy.  No stenosis.   L3-L4: Mostly far lateral mild disc bulging with endplate spurring. Mild to moderate facet hypertrophy. No spinal or lateral  recess stenosis. Borderline to mild bilateral L3 foraminal stenosis.   L4-L5: Chronic disc space loss. Retrolisthesis with circumferential disc osteophyte complex. Mild to moderate facet hypertrophy. No significant spinal stenosis. Mild bilateral lateral recess stenosis (descending L5 nerve levels). Superimposed left foraminal to far lateral disc protrusion (series 5, image 14). Moderate to severe left and mild to moderate right L4 foraminal stenosis.   L5-S1: Negative disc. Mild to moderate facet hypertrophy. No stenosis.   IMPRESSION: 1. L4-L5 advanced disc and endplate degeneration, and moderate posterior element degeneration where mild retrolisthesis appears increased since 2018. Moderate to severe L4 foraminal stenosis is greater on the left. Mild bilateral L5 nerve level lateral recess stenosis. 2. Mild for age lumbar spine degeneration elsewhere with no other convincing neural impingement.     Electronically Signed   By: Genevie Ann M.D.   On: 10/14/2018 03:29  Pain Inventory Average Pain 8 Pain Right Now 8 My pain is sharp, tingling, and aching  back injection helped left but not right, and did not last   In the last 24 hours, has pain interfered with the following? General activity 8 Relation with others 8 Enjoyment of life 8 What TIME of day is your pain at its worst? morning , daytime, and evening Sleep (in general) Good  Pain is worse with: walking, bending, sitting, and standing Pain improves with: rest, medication, and injections Relief from Meds:  not answered  Family History  Problem Relation Age of Onset   Depression Mother    Hypertension Mother    Dementia Mother    Heart disease Father        MI  Depression Brother    Stroke Paternal Aunt        CVA   Diabetes Paternal Uncle    Heart disease Paternal Uncle        MI   Heart disease Paternal Grandmother        MI   Diabetes Cousin        PATERNAL   Social History   Socioeconomic History    Marital status: Married    Spouse name: Not on file   Number of children: 1   Years of education: Not on file   Highest education level: Not on file  Occupational History   Occupation: retired-Manager [employer]  Tobacco Use   Smoking status: Former    Packs/day: 0.20    Years: 13.00    Pack years: 2.60    Types: Cigarettes    Start date: 04/03/1961    Quit date: 08/30/1974    Years since quitting: 46.3   Smokeless tobacco: Never  Vaping Use   Vaping Use: Never used  Substance and Sexual Activity   Alcohol use: No    Alcohol/week: 0.0 standard drinks   Drug use: No   Sexual activity: Not Currently  Other Topics Concern   Not on file  Social History Narrative   Lives at home w/ his wife   Right-handed   Caffeine: decaf coffee, occasional tea   Social Determinants of Health   Financial Resource Strain: Not on file  Food Insecurity: Not on file  Transportation Needs: Not on file  Physical Activity: Not on file  Stress: Not on file  Social Connections: Not on file   Past Surgical History:  Procedure Laterality Date   ARTHROSCOPY KNEE W/ DRILLING     BREATH TEK H PYLORI N/A 12/23/2012   Procedure: [place];  Surgeon: Gayland Curry, MD;  Location: [facility];  Service: General;  Laterality: N/A;   [place] N/A 07/31/2013   Procedure: COLONOSCOPY;  Surgeon: Gatha Mayer, MD;  Location: [facility];  Service: Endoscopy;  Laterality: N/A;   ESOPHAGOGASTRODUODENOSCOPY N/A 07/22/2013   Procedure: ESOPHAGOGASTRODUODENOSCOPY (EGD);  Surgeon: Irene Shipper, MD;  Location: [facility] END[facility]COPY;  Service: Endoscopy;  Laterality: N/A;   ESOPHAGOGASTRODUODENOSCOPY N/A 07/31/2013   Procedure: ESOPHAGOGASTRODUODENOSCOPY (EGD);  Surgeon: Gatha Mayer, MD;  Location: [facility] ENDOSCOPY;  Service: Endoscopy;  Laterality: N/A;   INTRAOPERATIVE TRANSESOPHAGEAL ECHOCARDIOGRAM N/A 07/18/2013   Procedure: INTRAOPERATIVE TRANSESOPHAGEAL ECHOCARDIOGRAM;  Surgeon: Grace Isaac, MD;  Location: [facility];  Service: Open Heart Surgery;  Laterality: N/A;   LAPAROSCOPIC GASTRIC BANDING WITH HIATAL HERNIA REPAIR N/A 04/08/2013   Procedure: LAPAROSCOPIC GASTRIC BANDING WITH possible  HIATAL HERNIA REPAIR;  Surgeon: Gayland Curry, MD;  Location: [facility];  Service: General;  Laterality: N/A;   LITHOTRIPSY     renal calculi     SUBXYPHOID PERICARDIAL WINDOW N/A 07/18/2013   Procedure: SUBXYPHOID PERICARDIAL WINDOW;  Surgeon: Grace Isaac, MD;  Location: [facility];  Service: Thoracic;  Laterality: N/A;   TONSILLECTOMY     total knee replacment     bilat   trigger finger repair     also lue, cts surgically repaired   WISDOM TOOTH EXTRACTION     Past Surgical History:  Procedure Laterality Date   ARTHROSCOPY KNEE W/ DRILLING     BREATH TEK H PYLORI N/A 12/23/2012   Procedure: BREATH TEK Kandis Ban;  Surgeon: Gayland Curry, MD;  Location: [facility];  Service: General;  Laterality: N/A;   CHOLECYSTECTOMY  1989   COLONOSCOPY N/A 07/31/2013   Procedure: COLONOSCOPY;  Surgeon: Gatha Mayer, MD;  Location: [facility];  Service: Endoscopy;  Laterality: N/A;   ESOPHAGOGASTRODUODENOSCOPY N/A 07/22/2013   Procedure: ESOPHAGOGASTRODUODENOSCOPY (EGD);  Surgeon: Irene Shipper, MD;  Location: [facility] ENDOSCOPY;  Service: Endoscopy;  Laterality: N/A;   ESOPHAGOGASTRODUODENOSCOPY N/A 07/31/2013   Procedure: ESOPHAGOGASTRODUODENOSCOPY (EGD);  Surgeon: Gatha Mayer, MD;  Location: [facility] ENDOSCOPY;  Service: Endoscopy;  Laterality: N/A;   INTRAOPERATIVE TRANSESOPHAGEAL ECHOCARDIOGRAM N/A 07/18/2013   Procedure: INTRAOPERATIVE TRANSESOPHAGEAL ECHOCARDIOGRAM;  Surgeon: Grace Isaac, MD;  Location: [place];  Service: Open Heart Surgery;  Laterality: N/A;   LAPAROSCOPIC GASTRIC BANDING WITH HIATAL HERNIA REPAIR N/A 04/08/2013   Procedure: LAPAROSCOPIC GASTRIC BANDING WITH possible  HIATAL HERNIA REPAIR;  Surgeon: Gayland Curry, MD;  Location: [facility];  Service: General;  Laterality: N/A;    LITHOTRIPSY     renal calculi     SUBXYPHOID PERICARDIAL WINDOW N/A 07/18/2013   Procedure: SUBXYPHOID PERICARDIAL WINDOW;  Surgeon: Grace Isaac, MD;  Location: [facility];  Service: Thoracic;  Laterality: N/A;   TONSILLECTOMY     total knee replacment     bilat   trigger finger repair     also lue, cts surgically repaired   WISDOM TOOTH EXTRACTION     Past Medical History:  Diagnosis Date   Anemia 07/29/2013   Asthma    childhood asthma   Benign essential HTN    Benign prostatic hypertrophy    several prostate biopsies neg for cancer.    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   FH: colonic polyps 2010   Gout    History of kidney stones    Hypertension    Kidney cysts    Morbid obesity (HCC)    Numbness    feet / legs   PAF (paroxysmal atrial fibrillation) with RVR 07/29/2013   Pericarditis 2015   s/p window   Recurrent pulmonary emboli (HCC) 06/13/2015   Sleep apnea    USES C PAP   Stroke syndrome    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   BP (!) 152/79   Pulse 80   Ht 6' (1.829 m)   Wt (!) 387 lb (175.5 kg) Comment: last recorded  SpO2 94%   BMI 52.49 kg/m   Opioid Risk Score:   Fall Risk Score:  `1  Depression screen PHQ 2/9  Depression screen [facility] 2/9 12/07/2020 11/09/2020 05/12/2020 10/27/2019 10/27/2019 11/14/2018 05/16/2018  Decreased Interest 1 0 0 0 0 1 1  Down, Depressed, Hopeless 1 0 0 0 0 1 1  PHQ - 2 Score 2 0 0 0 0 2 2  Altered sleeping - 0 0 - - 1 1  Tired, decreased energy - 0 0 - - 2 2  Change in appetite - 0 0 - - 1 0  Feeling bad or failure about yourself  - 0 0 - - 1 1  Trouble concentrating - 0 0 - - 1 0  Moving slowly or  fidgety/restless - 0 0 - - 0 0  Suicidal thoughts - 0 0 - - 0 0  PHQ-9 Score - 0 0 - - 8 6  Difficult doing work/chores - - - - - Somewhat difficult Not difficult at all  Some recent data might be hidden    Review of Systems  Constitutional: Negative.   HENT: Negative.    Eyes: Negative.   Respiratory: Negative.    Cardiovascular: Negative.   Gastrointestinal: Negative.   Endocrine: Negative.   Genitourinary: Negative.   Musculoskeletal:  Positive for back pain.  Skin: Negative.   Allergic/Immunologic: Negative.   Neurological: Negative.   Hematological:  Bruises/bleeds easily.       Eliquis  Psychiatric/Behavioral:  Positive for dysphoric mood.   All other systems reviewed and are negative.     Objective:   Physical Exam Vitals reviewed.  Constitutional:      Appearance: He is obese.  HENT:     Head: Normocephalic and atraumatic.  Eyes:     Extraocular Movements: Extraocular movements intact.     Conjunctiva/sclera: Conjunctivae normal.     Pupils: Pupils are equal, round, and reactive to light.  Musculoskeletal:     Comments: Mild tenderness palpation at the lumbosacral junction more so on the right side than the left Negative straight leg raising Bilateral lower extremity edema pretibial 2+, chronic with stasis changes  Skin:    General: Skin is warm and dry.  Neurological:     Mental Status: He is alert and oriented to person, place, and time.     Comments: Minimal dysmetria left finger-nose-finger normal right finger-nose-finger Patient has wide-based gait no evidence of toe drag or knee instability. Motor strength is 5/5 bilateral upper and lower limbs  Psychiatric:        Mood and Affect: Mood normal.        Behavior: Behavior normal.          Assessment & Plan:   1.  Lumbar spinal stenosis, no significant improvement after medial branch blocks.  No radicular symptoms. Will continue medication management encourage weight loss, have encouraged patient  to follow-up with his bariatric surgeon Continue tramadol 50 mg 2-3 times per day, tolerating this well no signs of misuse, PDMP reviewed, needs UDS at next visit 2.  History of left lateral medullary syndrome , chronic mild residual balance disorder can ambulate short distances without Rollator but does better with device

## 2020-12-07 NOTE — Telephone Encounter (Signed)
Per CoverMyMeds: ? ?PA was denied.  ?

## 2020-12-13 NOTE — Telephone Encounter (Signed)
Per CoverMyMeds:  "The drug is not covered under your Medicare Part D plan."

## 2020-12-16 ENCOUNTER — Ambulatory Visit (INDEPENDENT_AMBULATORY_CARE_PROVIDER_SITE_OTHER): Payer: Medicare Other

## 2020-12-16 DIAGNOSIS — Z0000 Encounter for general adult medical examination without abnormal findings: Secondary | ICD-10-CM | POA: Diagnosis not present

## 2020-12-16 NOTE — Progress Notes (Signed)
Subjective:   [name] is a 75 y.o. male who presents for Medicare Annual/Subsequent preventive examination.  Review of Systems     Cardiac Risk Factors include: family history of premature cardiovascular disease;diabetes mellitus;advanced age (>37mn, >>31women);obesity (BMI >30kg/m2);male gender;hypertension     Objective:    There were no vitals filed for this visit. There is no height or weight on file to calculate BMI.  Advanced Directives 12/16/2020 11/02/2020 10/27/2019 05/04/2019 10/13/2018 05/16/2018 12/12/2016  Does Patient Have a Medical Advance Directive? No No No No No No No  Would patient like information on creating a medical advance directive? No - Patient declined No - Patient declined Yes (MAU/Ambulatory/Procedural Areas - Information given) No - Patient declined - Yes (ED - Information included in AVS) -  Pre-existing out of facility DNR order (yellow form or pink MOST form) - - - - - - -  Some encounter information is confidential and restricted. Go to Review Flowsheets activity to see all data.    Current Medications (verified) Outpatient Encounter Medications as of 12/16/2020  Medication Sig   atorvastatin (LIPITOR) 80 MG tablet Take 1 tablet (80 mg total) by mouth daily.   [facility] MELATONIN GUMMIES PO Take by mouth.   Docusate Sodium (DSS) 100 MG CAPS Take 100 mg by mouth every other day.   ELIQUIS 5 MG TABS tablet TAKE 1 TABLET(5 MG) BY MOUTH TWICE DAILY   finasteride (PROSCAR) 5 MG tablet Take 1 tablet (5 mg total) by mouth daily.   flecainide (TAMBOCOR) 50 MG tablet Take 1 tablet (50 mg total) by mouth every 12 (twelve) hours.   loratadine (CLARITIN) 10 MG tablet Take 10 mg by mouth daily.   LORazepam (ATIVAN) 0.5 MG tablet Take 1 tablet (0.5 mg total) by mouth at bedtime.   methylPREDNISolone (MEDROL DOSEPAK) 4 MG TBPK tablet See admin instructions. follow package directions   montelukast (SINGULAIR) 10 MG tablet TAKE 1 TABLET(10 MG) BY MOUTH AT BEDTIME    Multiple Vitamin (MULTIVITAMIN WITH MINERALS) TABS tablet Take 1 tablet by mouth daily.   ofloxacin (OCUFLOX) 0.3 % ophthalmic solution Place 1 drop into the right eye 4 (four) times daily.   pantoprazole (PROTONIX) 40 MG tablet TAKE 1 TABLET(40 MG) BY MOUTH DAILY   potassium chloride SA (KLOR-CON) 20 MEQ tablet TAKE 1 TABLET BY MOUTH TWICE DAILY   rOPINIRole (REQUIP) 0.5 MG tablet Take 1 tablet (0.5 mg total) by mouth 3 (three) times daily.   sertraline (ZOLOFT) 100 MG tablet Take 1.5 tablets (150 mg total) by mouth at bedtime.   torsemide (DEMADEX) 20 MG tablet TAKE 3 TABLETS BY MOUTH ONCE DAILY.   traMADol (ULTRAM) 50 MG tablet Take 1 tablet (50 mg total) by mouth every 8 (eight) hours as needed.   traZODone (DESYREL) 50 MG tablet 1 to 3 tabs at bedtime for sleep as needed   No facility-administered encounter medications on file as of 12/16/2020.    Allergies (verified) Morphine, Penicillins, Darvon [propoxyphene], Morphine sulfate, Codeine, and Propoxyphene n-acetaminophen   History: Past Medical History:  Diagnosis Date   Anemia 07/29/2013   Asthma    childhood asthma   Benign essential HTN    Benign prostatic hypertrophy    several prostate biopsies neg for cancer.    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   FH: colonic polyps 2010   Gout    History of kidney stones    Hypertension    Kidney cysts    Morbid obesity (HCC)    Numbness    feet / legs   PAF (paroxysmal atrial fibrillation) with RVR 07/29/2013   Pericarditis 2015   s/p window   Recurrent pulmonary emboli ([place]) 06/13/2015   Sleep apnea    USES C PAP   Stroke syndrome    [place]  syndrome 06/30/2016   Past Surgical History:  Procedure Laterality Date   ARTHROSCOPY KNEE W/ DRILLING     BREATH TEK H PYLORI N/A 12/23/2012   Procedure: BREATH TEK H PYLORI;  Surgeon: Gayland Curry, MD;  Location: [facility];  Service: General;  Laterality: N/A;   [place] N/A 07/31/2013   Procedure: COLONOSCOPY;  Surgeon: Gatha Mayer, MD;  Location: [facility];  Service: Endoscopy;  Laterality: N/A;   ESOPHAGOGASTRODUODENOSCOPY N/A 07/22/2013   Procedure: ESOPHAGOGASTRODUODENOSCOPY (EGD);  Surgeon: Irene Shipper, MD;  Location: [facility]COPY;  Service: Endoscopy;  Laterality: N/A;   ESOPHAGOGASTRODUODENOSCOPY N/A 07/31/2013   Procedure: ESOPHAGOGASTRODUODENOSCOPY (EGD);  Surgeon: Gatha Mayer, MD;  Location: [facility] ENDOSCOPY;  Service: Endoscopy;  Laterality: N/A;   INTRAOPERATIVE TRANSESOPHAGEAL ECHOCARDIOGRAM N/A 07/18/2013   Procedure: INTRAOPERATIVE TRANSESOPHAGEAL ECHOCARDIOGRAM;  Surgeon: Grace Isaac, MD;  Location: [facility];  Service: Open Heart Surgery;  Laterality: N/A;   LAPAROSCOPIC GASTRIC BANDING WITH HIATAL HERNIA REPAIR N/A 04/08/2013   Procedure: LAPAROSCOPIC GASTRIC BANDING WITH possible  HIATAL HERNIA REPAIR;  Surgeon: Gayland Curry, MD;  Location: [facility];  Service: General;  Laterality: N/A;   LITHOTRIPSY     renal calculi     SUBXYPHOID PERICARDIAL WINDOW N/A 07/18/2013   Procedure: SUBXYPHOID PERICARDIAL WINDOW;  Surgeon: Grace Isaac, MD;  Location: [facility] OR;  Service: Thoracic;  Laterality: N/A;   TONSILLECTOMY     total knee replacment     bilat   trigger finger repair     also lue, cts surgically repaired   WISDOM TOOTH EXTRACTION     Family History  Problem Relation Age of Onset   Depression Mother    Hypertension Mother    Dementia Mother    Heart disease Father        MI   Depression Brother    Stroke Paternal Aunt        CVA   Diabetes Paternal Uncle    Heart disease Paternal Uncle        MI   Heart disease Paternal  Grandmother        MI   Diabetes Cousin        PATERNAL   Social History   Socioeconomic History   Marital status: Married    Spouse name: Not on file   Number of children: 1   Years of education: Not on file   Highest education level: Not on file  Occupational History   Occupation: retired-Manager [employer]  Tobacco Use   Smoking status: Former    Packs/day: 0.20    Years: 13.00    Pack years: 2.60    Types: Cigarettes    Start date: 04/03/1961    Quit date: 08/30/1974    Years since quitting: 46.3   Smokeless tobacco: Never  Vaping Use  Vaping Use: Never used  Substance and Sexual Activity   Alcohol use: No    Alcohol/week: 0.0 standard drinks   Drug use: No   Sexual activity: Not Currently  Other Topics Concern   Not on file  Social History Narrative   Lives at home w/ his wife   Right-handed   Caffeine: decaf coffee, occasional tea   Social Determinants of Health   Financial Resource Strain: Low Risk    Difficulty of Paying Living Expenses: Not hard at all  Food Insecurity: No Food Insecurity   Worried About Charity fundraiser in the Last Year: Never true   Ran Out of Food in the Last Year: Never true  Transportation Needs: No Transportation Needs   Lack of Transportation (Medical): No   Lack of Transportation (Non-Medical): No  Physical Activity: Inactive   Days of Exercise per Week: 0 days   Minutes of Exercise per Session: 0 min  Stress: No Stress Concern Present   Feeling of Stress : Not at all  Social Connections: Socially Integrated   Frequency of Communication with Friends and Family: More than three times a week   Frequency of Social Gatherings with Friends and Family: More than three times a week   Attends Religious Services: 1 to 4 times per year   Active Member of [employer] or Organizations: Yes   Attends Archivist Meetings: 1 to 4 times per year   Marital Status: Married    Tobacco Counseling Counseling given: Not  Answered   Clinical Intake:  Pre-visit preparation completed: Yes  Pain : 0-10 Pain Location: Rib cage Pain Orientation: Left Pain Radiating Towards: back Pain Descriptors / Indicators: Discomfort Pain Onset: More than a month ago Pain Frequency: Intermittent Effect of Pain on Daily Activities: Pain can diminish job performance, lower motivation to exercise, and prevent you from completing daily tasks. Pain produces disability and affects the quality of life.     Nutritional Risks: None Diabetes: Yes CBG done?: No Did pt. bring in CBG monitor from home?: No  How often do you need to have someone help you when you read instructions, pamphlets, or other written materials from your doctor or pharmacy?: 1 - Never What is the last grade level you completed in school?: Bachelor's Degree  Diabetic? yes  Interpreter Needed?: No  Information entered by :: Lisette Abu, LPN   Activities of Daily Living In your present state of health, do you have any difficulty performing the following activities: 12/16/2020 11/09/2020  Hearing? N N  Vision? N N  Difficulty concentrating or making decisions? N N  Walking or climbing stairs? N N  Dressing or bathing? N N  Doing errands, shopping? N N  Preparing Food and eating ? N -  Using the Toilet? N -  In the past six months, have you accidently leaked urine? N -  Do you have problems with loss of bowel control? N -  Managing your Medications? N -  Managing your Finances? N -  Housekeeping or managing your Housekeeping? N -  Some recent data might be hidden    Patient Care Team: Janith Lima, MD as PCP - General (Internal Medicine) Debara Pickett [name], MD as PCP - Cardiology (Cardiology) Himmelrich, Bryson Ha, RD (Inactive) as Dietitian Tia Masker) Leta Baptist, MD as Consulting Physician (Otolaryngology) Kathlee Nations, MD as Consulting Physician (Psychiatry) Debara Pickett [name], MD as Consulting Physician (Cardiology) Chyrel Masson,  DO (Otolaryngology) Deneise Lever, MD as Consulting Physician (  Pulmonary Disease) Charlton Haws, RPH as Pharmacist (Pharmacist) Opthamology, [facility] as Consulting Physician (Ophthalmology)  Indicate any recent Medical Services you may have received from other than [facility] providers in the past year (date may be approximate).     Assessment:   This is a routine wellness examination for [place].  Hearing/Vision screen Hearing Screening - Comments:: Patient wears hearing aids for decreased hearing. Vision Screening - Comments:: Patient wears eyeglasses.  Next appointment scheduled for 12/20/2020 with Dr. [name] Noon.  Dietary issues and exercise activities discussed: Current Exercise Habits: The patient does not participate in regular exercise at present, Exercise limited by: cardiac condition(s);orthopedic condition(s)   Goals Addressed   None   Depression Screen PHQ 2/9 Scores 12/16/2020 12/07/2020 11/09/2020 05/12/2020 10/27/2019 10/27/2019 11/14/2018  PHQ - 2 Score 0 2 0 0 0 0 2  PHQ- 9 Score - - 0 0 - - 8    Fall Risk Fall Risk  12/16/2020 12/07/2020 09/03/2020 02/24/2020 10/27/2019  Falls in the past year? 1 1 0 1 1  Comment - - - - -  Number falls in past yr: 1 0 0 1 1  Comment - Mid August 2022 - - -  Injury with Fall? 0 0 0 0 1  Comment - - - - -  Risk Factor Category  - - - - -  Risk for fall due to : Impaired balance/gait - - - Impaired balance/gait;Impaired mobility;Orthopedic patient  Follow up - - - - Falls evaluation completed;Education provided    FALL RISK PREVENTION PERTAINING TO THE HOME:  Any stairs in or around the home? No  If so, are there any without handrails? No  Home free of loose throw rugs in walkways, pet beds, electrical cords, etc? Yes  Adequate lighting in your home to reduce risk of falls? Yes   ASSISTIVE DEVICES UTILIZED TO PREVENT FALLS:  Life alert? No  Use of a cane, walker or w/c? Yes  Grab bars in the bathroom? Yes  Shower chair or  bench in shower? Yes  Elevated toilet seat or a handicapped toilet? Yes   TIMED UP AND GO:  Was the test performed? No .  Length of time to ambulate 10 feet: n/a sec.   Gait steady and fast with assistive device  Cognitive Function: Normal cognitive status assessed by direct observation by this Nurse Health Advisor. No abnormalities found.       6CIT Screen 10/27/2019  What Year? 0 points  What month? 0 points  What time? 0 points  Count back from 20 0 points  Months in reverse 2 points  Repeat phrase 2 points  Total Score 4    Immunizations Immunization History  Administered Date(s) Administered   Fluad Quad(high Dose 65+) 12/11/2018   Influenza, High Dose Seasonal PF 01/20/2016, 01/31/2017, 03/06/2018   Influenza,inj,Quad PF,6+ Mos 04/09/2013   Influenza-Unspecified 03/08/2018, 03/02/2020   PFIZER(Purple Top)SARS-COV-2 Vaccination 05/10/2019, 06/04/2019, 03/02/2020   Pneumococcal Conjugate-13 10/20/2015   Pneumococcal Polysaccharide-23 04/09/2013, 10/27/2019   Td 11/02/2009   Tdap 01/20/2016    TDAP status: Up to date  Flu Vaccine status: Up to date  Pneumococcal vaccine status: Up to date  Covid-19 vaccine status: Completed vaccines  Qualifies for Shingles Vaccine? Yes   Zostavax completed No   Shingrix Completed?: No.    Education has been provided regarding the importance of this vaccine. Patient has been advised to call insurance company to determine out of pocket expense if they have not yet received this vaccine. Advised  may also receive vaccine at local pharmacy or Health Dept. Verbalized acceptance and understanding.  Screening Tests Health Maintenance  Topic Date Due   Zoster Vaccines- Shingrix (1 of 2) Never done   OPHTHALMOLOGY EXAM  05/13/2020   COVID-19 Vaccine (4 - Booster for Pfizer series) 05/25/2020   INFLUENZA VACCINE  11/01/2020   HEMOGLOBIN A1C  05/12/2021   FOOT EXAM  11/09/2021   URINE MICROALBUMIN  11/09/2021   COLONOSCOPY (Pts  45-69yr Insurance coverage will need to be confirmed)  08/01/2023   TETANUS/TDAP  01/19/2026   Hepatitis C Screening  Completed   PNA vac Low Risk Adult  Completed   HPV VACCINES  Aged Out    Health Maintenance  Health Maintenance Due  Topic Date Due   Zoster Vaccines- Shingrix (1 of 2) Never done   OPHTHALMOLOGY EXAM  05/13/2020   COVID-19 Vaccine (4 - Booster for Pfizer series) 05/25/2020   INFLUENZA VACCINE  11/01/2020    Colorectal cancer screening: Type of screening: Colonoscopy. Completed 07/31/2013. Repeat every 10 years  Lung Cancer Screening: (Low Dose CT Chest recommended if Age 75-80years, 30 pack-year currently smoking OR have quit w/in 15years.) does not qualify.   Lung Cancer Screening Referral: no  Additional Screening:  Hepatitis C Screening: does qualify; Completed yes  Vision Screening: Recommended annual ophthalmology exams for early detection of glaucoma and other disorders of the eye. Is the patient up to date with their annual eye exam?  Yes  Who is the provider or what is the name of the office in which the patient attends annual eye exams? JMelissa Noon MD. If pt is not established with a provider, would they like to be referred to a provider to establish care? No .   Dental Screening: Recommended annual dental exams for proper oral hygiene  Community Resource Referral / Chronic Care Management: CRR required this visit?  No   CCM required this visit?  No      Plan:     I have personally reviewed and noted the following in the patient's chart:   Medical and social history Use of alcohol, tobacco or illicit drugs  Current medications and supplements including opioid prescriptions. Patient is not currently taking opioid prescriptions. Functional ability and status Nutritional status Physical activity Advanced directives List of other physicians Hospitalizations, surgeries, and ER visits in previous 12 months Vitals Screenings to include  cognitive, depression, and falls Referrals and appointments  In addition, I have reviewed and discussed with patient certain preventive protocols, quality metrics, and best practice recommendations. A written personalized care plan for preventive services as well as general preventive health recommendations were provided to patient.     SSheral Flow LPN   [id number]  Nurse Notes:  Patient is cogitatively intact. There were no vitals filed for this visit. There is no height or weight on file to calculate BMI. Hearing Screening - Comments:: Patient wears hearing aids for decreased hearing. Vision Screening - Comments:: Patient wears eyeglasses.  Next appointment scheduled for 12/20/2020 with Dr. JMelissa Noon

## 2020-12-16 NOTE — Patient Instructions (Signed)
[name] , Thank you for taking time to come for your Medicare Wellness Visit. I appreciate your ongoing commitment to your health goals. Please review the following plan we discussed and let me know if I can assist you in the future.   Screening recommendations/referrals: Colonoscopy: 07/31/2013; due every 10 years Recommended yearly ophthalmology/optometry visit for glaucoma screening and checkup Recommended yearly dental visit for hygiene and checkup  Vaccinations: Influenza vaccine: 03/02/2020 Pneumococcal vaccine: 10/20/2015,10/27/2019 Tdap vaccine: 01/20/2016; due every 10 years Shingles vaccine: never done   Covid-19: 05/10/2019, 06/04/2019, 03/02/2020  Advanced directives: Advance directive discussed with you today. Even though you declined this today please call our office should you change your mind and we can give you the proper paperwork for you to fill out.  Conditions/risks identified: Yes; Client understands the importance of follow-up with providers by attending scheduled visits and discussed goals to eat healthier, increase physical activity, exercise the brain, socialize more, get enough sleep and make time for laughter.  Next appointment: Please schedule your next Medicare Wellness Visit with your Nurse Health Advisor in 1 year by calling [phone].  Preventive Care 75 Years and Older, Male Preventive care refers to lifestyle choices and visits with your health care provider that can promote health and wellness. What does preventive care include? A yearly physical exam. This is also called an annual well check. Dental exams once or twice a year. Routine eye exams. Ask your health care provider how often you should have your eyes checked. Personal lifestyle choices, including: Daily care of your teeth and gums. Regular physical activity. Eating a healthy diet. Avoiding tobacco and drug use. Limiting alcohol use. Practicing safe sex. Taking low doses of aspirin every  day. Taking vitamin and mineral supplements as recommended by your health care provider. What happens during an annual well check? The services and screenings done by your health care provider during your annual well check will depend on your age, overall health, lifestyle risk factors, and family history of disease. Counseling  Your health care provider may ask you questions about your: Alcohol use. Tobacco use. Drug use. Emotional well-being. Home and relationship well-being. Sexual activity. Eating habits. History of falls. Memory and ability to understand (cognition). Work and work Statistician. Screening  You may have the following tests or measurements: Height, weight, and BMI. Blood pressure. Lipid and cholesterol levels. These may be checked every 5 years, or more frequently if you are over 65 years old. Skin check. Lung cancer screening. You may have this screening every year starting at age 75 if you have a 30-pack-year history of smoking and currently smoke or have quit within the past 15 years. Fecal occult blood test (FOBT) of the stool. You may have this test every year starting at age 75. Flexible sigmoidoscopy or colonoscopy. You may have a sigmoidoscopy every 5 years or a colonoscopy every 10 years starting at age 75. Prostate cancer screening. Recommendations will vary depending on your family history and other risks. Hepatitis C blood test. Hepatitis B blood test. Sexually transmitted disease (STD) testing. Diabetes screening. This is done by checking your blood sugar (glucose) after you have not eaten for a while (fasting). You may have this done every 1-3 years. Abdominal aortic aneurysm (AAA) screening. You may need this if you are a current or former smoker. Osteoporosis. You may be screened starting at age 75 if you are at high risk. Talk with your health care provider about your test results, treatment options, and if necessary,  the need for more  tests. Vaccines  Your health care provider may recommend certain vaccines, such as: Influenza vaccine. This is recommended every year. Tetanus, diphtheria, and acellular pertussis (Tdap, Td) vaccine. You may need a Td booster every 10 years. Zoster vaccine. You may need this after age 75. Pneumococcal 13-valent conjugate (PCV13) vaccine. One dose is recommended after age 75. Pneumococcal polysaccharide (PPSV23) vaccine. One dose is recommended after age 75. Talk to your health care provider about which screenings and vaccines you need and how often you need them. This information is not intended to replace advice given to you by your health care provider. Make sure you discuss any questions you have with your health care provider. Document Released: 04/16/2015 Document Revised: 12/08/2015 Document Reviewed: 01/19/2015 Elsevier Interactive Patient Education  2017 [place] Prevention in the Home Falls can cause injuries. They can happen to people of all ages. There are many things you can do to make your home safe and to help prevent falls. What can I do on the outside of my home? Regularly fix the edges of walkways and driveways and fix any cracks. Remove anything that might make you trip as you walk through a door, such as a raised step or threshold. Trim any bushes or trees on the path to your home. Use bright outdoor lighting. Clear any walking paths of anything that might make someone trip, such as rocks or tools. Regularly check to see if handrails are loose or broken. Make sure that both sides of any steps have handrails. Any raised decks and porches should have guardrails on the edges. Have any leaves, snow, or ice cleared regularly. Use sand or salt on walking paths during winter. Clean up any spills in your garage right away. This includes oil or grease spills. What can I do in the bathroom? Use night lights. Install grab bars by the toilet and in the tub and shower.  Do not use towel bars as grab bars. Use non-skid mats or decals in the tub or shower. If you need to sit down in the shower, use a plastic, non-slip stool. Keep the floor dry. Clean up any water that spills on the floor as soon as it happens. Remove soap buildup in the tub or shower regularly. Attach bath mats securely with double-sided non-slip rug tape. Do not have throw rugs and other things on the floor that can make you trip. What can I do in the bedroom? Use night lights. Make sure that you have a light by your bed that is easy to reach. Do not use any sheets or blankets that are too big for your bed. They should not hang down onto the floor. Have a firm chair that has side arms. You can use this for support while you get dressed. Do not have throw rugs and other things on the floor that can make you trip. What can I do in the kitchen? Clean up any spills right away. Avoid walking on wet floors. Keep items that you use a lot in easy-to-reach places. If you need to reach something above you, use a strong step stool that has a grab bar. Keep electrical cords out of the way. Do not use floor polish or wax that makes floors slippery. If you must use wax, use non-skid floor wax. Do not have throw rugs and other things on the floor that can make you trip. What can I do with my stairs? Do not leave any items on  the stairs. Make sure that there are handrails on both sides of the stairs and use them. Fix handrails that are broken or loose. Make sure that handrails are as long as the stairways. Check any carpeting to make sure that it is firmly attached to the stairs. Fix any carpet that is loose or worn. Avoid having throw rugs at the top or bottom of the stairs. If you do have throw rugs, attach them to the floor with carpet tape. Make sure that you have a light switch at the top of the stairs and the bottom of the stairs. If you do not have them, ask someone to add them for you. What else  can I do to help prevent falls? Wear shoes that: Do not have high heels. Have rubber bottoms. Are comfortable and fit you well. Are closed at the toe. Do not wear sandals. If you use a stepladder: Make sure that it is fully opened. Do not climb a closed stepladder. Make sure that both sides of the stepladder are locked into place. Ask someone to hold it for you, if possible. Clearly mark and make sure that you can see: Any grab bars or handrails. First and last steps. Where the edge of each step is. Use tools that help you move around (mobility aids) if they are needed. These include: Canes. Walkers. Scooters. Crutches. Turn on the lights when you go into a dark area. Replace any light bulbs as soon as they burn out. Set up your furniture so you have a clear path. Avoid moving your furniture around. If any of your floors are uneven, fix them. If there are any pets around you, be aware of where they are. Review your medicines with your doctor. Some medicines can make you feel dizzy. This can increase your chance of falling. Ask your doctor what other things that you can do to help prevent falls. This information is not intended to replace advice given to you by your health care provider. Make sure you discuss any questions you have with your health care provider. Document Released: 01/14/2009 Document Revised: 08/26/2015 Document Reviewed: 04/24/2014 Elsevier Interactive Patient Education  2017 [employer].

## 2020-12-30 ENCOUNTER — Ambulatory Visit (INDEPENDENT_AMBULATORY_CARE_PROVIDER_SITE_OTHER): Payer: Medicare Other

## 2020-12-30 ENCOUNTER — Encounter: Payer: Self-pay | Admitting: Internal Medicine

## 2020-12-30 ENCOUNTER — Ambulatory Visit (INDEPENDENT_AMBULATORY_CARE_PROVIDER_SITE_OTHER): Payer: Medicare Other | Admitting: Internal Medicine

## 2020-12-30 ENCOUNTER — Other Ambulatory Visit: Payer: Self-pay

## 2020-12-30 VITALS — BP 128/64 | HR 85 | Temp 98.8°F | Resp 16 | Ht 72.0 in | Wt 388.0 lb

## 2020-12-30 DIAGNOSIS — I10 Essential (primary) hypertension: Secondary | ICD-10-CM | POA: Diagnosis not present

## 2020-12-30 DIAGNOSIS — R0789 Other chest pain: Secondary | ICD-10-CM | POA: Insufficient documentation

## 2020-12-30 DIAGNOSIS — E118 Type 2 diabetes mellitus with unspecified complications: Secondary | ICD-10-CM | POA: Diagnosis not present

## 2020-12-30 DIAGNOSIS — G463 Brain stem stroke syndrome: Secondary | ICD-10-CM | POA: Diagnosis not present

## 2020-12-30 DIAGNOSIS — R233 Spontaneous ecchymoses: Secondary | ICD-10-CM | POA: Diagnosis not present

## 2020-12-30 DIAGNOSIS — S2341XA Sprain of ribs, initial encounter: Secondary | ICD-10-CM | POA: Diagnosis not present

## 2020-12-30 LAB — CBC WITH DIFFERENTIAL/PLATELET
Basophils Absolute: 0.1 10*3/uL (ref 0.0–0.1)
Basophils Relative: 0.7 % (ref 0.0–3.0)
Eosinophils Absolute: 0.1 10*3/uL (ref 0.0–0.7)
Eosinophils Relative: 1.2 % (ref 0.0–5.0)
HCT: 41.4 % (ref 39.0–52.0)
Hemoglobin: 13.7 g/dL (ref 13.0–17.0)
Lymphocytes Relative: 10.6 % — ABNORMAL LOW (ref 12.0–46.0)
Lymphs Abs: 1 10*3/uL (ref 0.7–4.0)
MCHC: 33 g/dL (ref 30.0–36.0)
MCV: 89.7 fl (ref 78.0–100.0)
Monocytes Absolute: 0.9 10*3/uL (ref 0.1–1.0)
Monocytes Relative: 8.7 % (ref 3.0–12.0)
Neutro Abs: 7.8 10*3/uL — ABNORMAL HIGH (ref 1.4–7.7)
Neutrophils Relative %: 78.8 % — ABNORMAL HIGH (ref 43.0–77.0)
Platelets: 242 10*3/uL (ref 150.0–400.0)
RBC: 4.61 Mil/uL (ref 4.22–5.81)
RDW: 14.6 % (ref 11.5–15.5)
WBC: 9.9 10*3/uL (ref 4.0–10.5)

## 2020-12-30 LAB — APTT: aPTT: 33.3 s — ABNORMAL HIGH (ref 23.4–32.7)

## 2020-12-30 LAB — PROTIME-INR
INR: 1.6 ratio — ABNORMAL HIGH (ref 0.8–1.0)
Prothrombin Time: 17 s — ABNORMAL HIGH (ref 9.6–13.1)

## 2020-12-30 NOTE — Progress Notes (Signed)
Subjective:  Patient ID: [name], male    DOB: [birth year]  Age: 75 y.o. MRN: [id number]  CC: Back Pain  This visit occurred during the SARS-CoV-2 public health emergency.  Safety protocols were in place, including screening questions prior to the visit, additional usage of staff PPE, and extensive cleaning of exam room while observing appropriate contact time as indicated for disinfecting solutions.    HPI [name] presents for f/up - 3 days his wife noted some bruising around his left lower rib cage.  He denies any recent trauma or injury.  He complains of chronic musculoskeletal pain in his low back and large joints.  He tells me this has not recently worsened.  He is controlling the pain with tramadol.  He tells me he recently saw urologist and an ultrasound of his kidneys was unremarkable.  Outpatient Medications Prior to Visit  Medication Sig Dispense Refill  . atorvastatin (LIPITOR) 80 MG tablet Take 1 tablet (80 mg total) by mouth daily. 90 tablet 1  . [facility] MELATONIN GUMMIES PO Take by mouth.    [name] Sodium (DSS) 100 MG CAPS Take 100 mg by mouth every other day.    [name] ELIQUIS 5 MG TABS tablet TAKE 1 TABLET(5 MG) BY MOUTH TWICE DAILY 180 tablet 0  . finasteride (PROSCAR) 5 MG tablet Take 1 tablet (5 mg total) by mouth daily. 30 tablet 1  . flecainide (TAMBOCOR) 50 MG tablet Take 1 tablet (50 mg total) by mouth every 12 (twelve) hours. 180 tablet 3  . loratadine (CLARITIN) 10 MG tablet Take 10 mg by mouth daily.    [name] LORazepam (ATIVAN) 0.5 MG tablet Take 1 tablet (0.5 mg total) by mouth at bedtime. 90 tablet 1  . methylPREDNISolone (MEDROL DOSEPAK) 4 MG TBPK tablet See admin instructions. follow package directions    . montelukast (SINGULAIR) 10 MG tablet TAKE 1 TABLET(10 MG) BY MOUTH AT BEDTIME 90 tablet 1  . Multiple Vitamin (MULTIVITAMIN WITH MINERALS) TABS tablet Take 1 tablet by mouth daily.    [name] ofloxacin (OCUFLOX) 0.3 % ophthalmic solution Place 1 drop  into the right eye 4 (four) times daily. 5 mL 0  . pantoprazole (PROTONIX) 40 MG tablet TAKE 1 TABLET(40 MG) BY MOUTH DAILY 90 tablet 1  . potassium chloride SA (KLOR-CON) 20 MEQ tablet TAKE 1 TABLET BY MOUTH TWICE DAILY 180 tablet 3  . rOPINIRole (REQUIP) 0.5 MG tablet Take 1 tablet (0.5 mg total) by mouth 3 (three) times daily. 270 tablet 2  . sertraline (ZOLOFT) 100 MG tablet Take 1.5 tablets (150 mg total) by mouth at bedtime. 135 tablet 1  . torsemide (DEMADEX) 20 MG tablet TAKE 3 TABLETS BY MOUTH ONCE DAILY. 270 tablet 3  . traMADol (ULTRAM) 50 MG tablet Take 1 tablet (50 mg total) by mouth every 8 (eight) hours as needed. 90 tablet 5  . traZODone (DESYREL) 50 MG tablet 1 to 3 tabs at bedtime for sleep as needed 50 tablet 3   No facility-administered medications prior to visit.    ROS Review of Systems  Constitutional:  Negative for chills, diaphoresis and fatigue.  HENT: Negative.  Negative for nosebleeds.   Eyes: Negative.   Respiratory:  Negative for cough, chest tightness, shortness of breath and wheezing.   Cardiovascular:  Negative for chest pain, palpitations and leg swelling.  Gastrointestinal:  Negative for abdominal pain, constipation, diarrhea, nausea and vomiting.  Endocrine: Negative.   Genitourinary: Negative.  Negative for difficulty urinating,  dysuria, flank pain and hematuria.  Musculoskeletal:  Positive for arthralgias and back pain. Negative for myalgias.  Skin: Negative.   Neurological: Negative.  Negative for dizziness and light-headedness.  Hematological:  Negative for adenopathy. Bruises/bleeds easily.  Psychiatric/Behavioral: Negative.     Objective:  BP 128/64 (BP Location: Right Arm, Patient Position: Sitting, Cuff Size: Large)   Pulse 85   Temp 98.8 F (37.1 C) (Oral)   Resp 16   Ht 6' (1.829 m)   Wt (!) 388 lb (176 kg)   SpO2 96%   BMI 52.62 kg/m   BP Readings from Last 3 Encounters:  12/30/20 128/64  12/07/20 (!) 152/79  11/09/20 132/82     Wt Readings from Last 3 Encounters:  12/30/20 (!) 388 lb (176 kg)  12/07/20 (!) 387 lb (175.5 kg)  11/09/20 (!) 387 lb (175.5 kg)    Physical Exam Vitals reviewed.  Constitutional:      General: He is not in acute distress.    Appearance: He is obese. He is ill-appearing. He is not toxic-appearing or diaphoretic.  HENT:     Nose: Nose normal.     Mouth/Throat:     Mouth: Mucous membranes are moist.  Eyes:     Conjunctiva/sclera: Conjunctivae normal.  Cardiovascular:     Rate and Rhythm: Normal rate and regular rhythm.     Heart sounds: No murmur heard. Pulmonary:     Effort: Pulmonary effort is normal.     Breath sounds: No stridor. No wheezing, rhonchi or rales.  Chest:     Chest wall: No mass, swelling, tenderness or crepitus.  Abdominal:     General: Abdomen is protuberant. Bowel sounds are normal. There is no distension.     Palpations: Abdomen is soft. There is no hepatomegaly, splenomegaly or mass.     Tenderness: There is no abdominal tenderness.  Musculoskeletal:        General: Normal range of motion.     Cervical back: Neck supple.     Right lower leg: No edema.     Left lower leg: No edema.  Lymphadenopathy:     Cervical: No cervical adenopathy.  Skin:    General: Skin is warm and dry.     Findings: Bruising present.  Neurological:     General: No focal deficit present.     Mental Status: He is alert.    Lab Results  Component Value Date   WBC 9.9 12/30/2020   HGB 13.7 12/30/2020   HCT 41.4 12/30/2020   PLT 242.0 12/30/2020   GLUCOSE 154 (H) 11/09/2020   CHOL 149 11/09/2020   TRIG 139.0 11/09/2020   HDL 44.20 11/09/2020   LDLDIRECT 91.0 05/16/2018   LDLCALC 77 11/09/2020   ALT 17 11/02/2020   AST 26 11/02/2020   NA 145 11/09/2020   K 3.5 11/09/2020   CL 99 11/09/2020   CREATININE 1.26 11/09/2020   BUN 36 (H) 11/09/2020   CO2 34 (H) 11/09/2020   TSH 2.43 11/09/2020   PSA 0.47 11/09/2020   INR 1.6 (H) 12/30/2020   HGBA1C 6.5 11/09/2020    MICROALBUR <0.7 11/09/2020    MR BRAIN WO CONTRAST  Result Date: 11/03/2020 CLINICAL DATA:  Facial droop EXAM: MRI HEAD WITHOUT CONTRAST TECHNIQUE: Multiplanar, multiecho pulse sequences of the brain and surrounding structures were obtained without intravenous contrast. COMPARISON:  06/19/2016 FINDINGS: Brain: No acute infarct, mass effect or extra-axial collection. No acute or chronic hemorrhage. Normal white matter signal. Generalized volume loss without a  clear lobar predilection. The midline structures are normal. Vascular: Major flow voids are preserved. Skull and upper cervical spine: Normal calvarium and skull base. Visualized upper cervical spine and soft tissues are normal. Sinuses/Orbits:No paranasal sinus fluid levels or advanced mucosal thickening. No mastoid or middle ear effusion. Normal orbits. IMPRESSION: 1. No acute intracranial abnormality. 2. Generalized volume loss without a clear lobar predilection. Electronically Signed   By: Ulyses Jarred M.D.   On: 11/03/2020 01:24    DG Chest 2 View  Result Date: 12/30/2020 CLINICAL DATA:  Chest wall pain with bruising EXAM: CHEST - 2 VIEW COMPARISON:  12/27/2016 FINDINGS: No focal opacity or pleural effusion. Normal cardiomediastinal silhouette. No pneumothorax. IMPRESSION: No active cardiopulmonary disease. See separately dictated left rib report Electronically Signed   By: Donavan Foil M.D.   On: 12/30/2020 15:14   DG Ribs Unilateral Left  Result Date: 12/30/2020 CLINICAL DATA:  LEFT chest wall pain and bruising EXAM: LEFT RIBS - 2 VIEW COMPARISON:  None. FINDINGS: No LEFT rib fracture. No pneumothorax. Gastric banding device noted. IMPRESSION: No rib fracture identified.  No pneumothorax Electronically Signed   By: Suzy Bouchard M.D.   On: 12/30/2020 15:14     Assessment & Plan:   Nicholi was seen today for back pain.  Diagnoses and all orders for this visit:  Left-sided chest wall pain- Plain films are negative for rib  fracture or pulmonary contusion. -     DG Ribs Unilateral Left; Future -     DG Chest 2 View; Future  Spontaneous ecchymosis- His CBC is reassuring.  His coags are consistent with someone who takes a DOAC.  I think the spontaneous bruising is related to the anticoagulation.  Will continue the current dose of Eliquis. -     CBC with Differential/Platelet; Future -     Protime-INR; Future -     APTT; Future -     APTT -     Protime-INR -     CBC with Differential/Platelet  Benign essential HTN- His blood pressure is well controlled.  Type II diabetes mellitus with manifestations ([place])- His blood sugar is adequately well controlled.  I am having Bernardo A. Fleischhacker "[name]" maintain his DSS, loratadine, finasteride, multivitamin with minerals, [facility] MELATONIN GUMMIES PO, potassium chloride SA, torsemide, flecainide, traZODone, pantoprazole, traMADol, montelukast, atorvastatin, methylPREDNISolone, rOPINIRole, ofloxacin, sertraline, LORazepam, and Eliquis.  No orders of the defined types were placed in this encounter.    Follow-up: Return if symptoms worsen or fail to improve.  [name], MD

## 2020-12-30 NOTE — Patient Instructions (Addendum)
Chest Wall Pain Chest wall pain is pain in or around the bones and muscles of your chest. Sometimes, an injury causes this pain. Excessive coughing or overuse of arm and chest muscles may also cause chest wall pain. Sometimes, the cause may not be known. This pain may take several weeks or longer to get better. Follow these instructions at home: Managing pain, stiffness, and swelling  If directed, put ice on the painful area: Put ice in a plastic bag. Place a towel between your skin and the bag. Leave the ice on for 20 minutes, 2-3 times per day. Activity Rest as told by your health care provider. Avoid activities that cause pain. These include any activities that use your chest muscles or your abdominal and side muscles to lift heavy items. Ask your health care provider what activities are safe for you. General instructions  Take over-the-counter and prescription medicines only as told by your health care provider. Do not use any products that contain nicotine or tobacco, such as cigarettes, e-cigarettes, and chewing tobacco. These can delay healing after injury. If you need help quitting, ask your health care provider. Keep all follow-up visits as told by your health care provider. This is important. Contact a health care provider if: You have a fever. Your chest pain becomes worse. You have new symptoms. Get help right away if: You have nausea or vomiting. You feel sweaty or light-headed. You have a cough with mucus from your lungs (sputum) or you cough up blood. You develop shortness of breath. These symptoms may represent a serious problem that is an emergency. Do not wait to see if the symptoms will go away. Get medical help right away. Call your local emergency services (911 in the [place] Do not drive yourself to the hospital. Summary Chest wall pain is pain in or around the bones and muscles of your chest. Depending on the cause, it may be treated with ice, rest, medicines, and  avoiding activities that cause pain. Contact a health care provider if you have a fever, worsening chest pain, or new symptoms. Get help right away if you feel light-headed or you develop shortness of breath. These symptoms may be an emergency. This information is not intended to replace advice given to you by your health care provider. Make sure you discuss any questions you have with your health care provider. Document Revised: 06/04/2020 Document Reviewed: 06/04/2020 Elsevier Patient Education  [place].

## 2020-12-31 ENCOUNTER — Encounter: Payer: Self-pay | Admitting: Internal Medicine

## 2021-01-03 ENCOUNTER — Other Ambulatory Visit: Payer: Self-pay | Admitting: Internal Medicine

## 2021-01-03 ENCOUNTER — Telehealth: Payer: Self-pay

## 2021-01-03 DIAGNOSIS — G8929 Other chronic pain: Secondary | ICD-10-CM | POA: Insufficient documentation

## 2021-01-03 DIAGNOSIS — R109 Unspecified abdominal pain: Secondary | ICD-10-CM

## 2021-01-03 DIAGNOSIS — I2699 Other pulmonary embolism without acute cor pulmonale: Secondary | ICD-10-CM

## 2021-01-03 DIAGNOSIS — R0789 Other chest pain: Secondary | ICD-10-CM

## 2021-01-03 NOTE — Telephone Encounter (Signed)
Key: [id number]  Per CoverMyMeds:  PA not required

## 2021-01-04 ENCOUNTER — Ambulatory Visit: Payer: Medicare Other | Admitting: Internal Medicine

## 2021-01-04 ENCOUNTER — Telehealth: Payer: Self-pay | Admitting: Pharmacist

## 2021-01-04 NOTE — Progress Notes (Signed)
Chronic Care Management Pharmacy Assistant   Name: [name]  MRN: [id number] DOB: 01-18-1946   Reason for Encounter: Disease State   Conditions to be addressed/monitored: General  Recent office visits:  12/30/20 [name] Lima, MD-PCP (Back Pain) No med changes  Recent consult visits:  12/07/20 [name] Blake, MD-Physical Medicine/Rehabilitation (Spinal stenosis, lumbar region, without neurogenic claudication) No med changes  Hospital visits:  None since last coordination call  Medications: Outpatient Encounter Medications as of 01/04/2021  Medication Sig Note   atorvastatin (LIPITOR) 80 MG tablet Take 1 tablet (80 mg total) by mouth daily.    [facility] MELATONIN GUMMIES PO Take by mouth.    Docusate Sodium (DSS) 100 MG CAPS Take 100 mg by mouth every other day.    ELIQUIS 5 MG TABS tablet TAKE 1 TABLET(5 MG) BY MOUTH TWICE DAILY    finasteride (PROSCAR) 5 MG tablet Take 1 tablet (5 mg total) by mouth daily.    flecainide (TAMBOCOR) 50 MG tablet Take 1 tablet (50 mg total) by mouth every 12 (twelve) hours.    loratadine (CLARITIN) 10 MG tablet Take 10 mg by mouth daily.    LORazepam (ATIVAN) 0.5 MG tablet Take 1 tablet (0.5 mg total) by mouth at bedtime.    methylPREDNISolone (MEDROL DOSEPAK) 4 MG TBPK tablet See admin instructions. follow package directions    montelukast (SINGULAIR) 10 MG tablet TAKE 1 TABLET(10 MG) BY MOUTH AT BEDTIME    Multiple Vitamin (MULTIVITAMIN WITH MINERALS) TABS tablet Take 1 tablet by mouth daily.    ofloxacin (OCUFLOX) 0.3 % ophthalmic solution Place 1 drop into the right eye 4 (four) times daily.    pantoprazole (PROTONIX) 40 MG tablet TAKE 1 TABLET(40 MG) BY MOUTH DAILY    potassium chloride SA (KLOR-CON) 20 MEQ tablet TAKE 1 TABLET BY MOUTH TWICE DAILY    rOPINIRole (REQUIP) 0.5 MG tablet Take 1 tablet (0.5 mg total) by mouth 3 (three) times daily.    sertraline (ZOLOFT) 100 MG tablet Take 1.5 tablets (150 mg total) by mouth at bedtime.     torsemide (DEMADEX) 20 MG tablet TAKE 3 TABLETS BY MOUTH ONCE DAILY.    traMADol (ULTRAM) 50 MG tablet Take 1 tablet (50 mg total) by mouth every 8 (eight) hours as needed. 12/07/2020: Fill date 11/16/20 #90 today #60 last dose today   traZODone (DESYREL) 50 MG tablet 1 to 3 tabs at bedtime for sleep as needed    No facility-administered encounter medications on file as of 01/04/2021.    Contacted [name] on 01/04/21 for general disease state and medication adherence call.   Patient is not > 5 days past due for refill on the following medications per chart history:  [facility] Medications: Medication Name/mg Last Fill Days Supply Atorvastatin 80 mg  11/20/20 90   What concerns do you have about your medications? Patient states that he does not have any concerns about medication, since he has eliquis now  The patient denies side effects with his medications.   How often do you forget or accidentally miss a dose? Rarely  Do you use a pillbox? No  Are you having any problems getting your medications from your pharmacy? No  Has the cost of your medications been a concern? No If yes, what medication and is patient assistance available or has it been applied for?  Since last visit with CPP, no interventions have been made:   The patient has not had an ED visit since last  contact.   The patient denies problems with their health.   he denies  concerns or questions for [employer], Pharm. D at this time.    Care Gaps: Annual wellness visit in last year? No Most Recent BP reading and date:128/64 -12/30/20  If Diabetic: Most recent A1C reading:6.5 11/09/20 Last eye exam / retinopathy screening:05/14/19 Last diabetic foot exam:11/09/20  CCM appointment on 03/16/21    Ethelene Hal Clinical Pharmacist Assistant [phone]

## 2021-01-05 DIAGNOSIS — E119 Type 2 diabetes mellitus without complications: Secondary | ICD-10-CM | POA: Diagnosis not present

## 2021-01-05 DIAGNOSIS — H2513 Age-related nuclear cataract, bilateral: Secondary | ICD-10-CM | POA: Diagnosis not present

## 2021-01-05 DIAGNOSIS — H52203 Unspecified astigmatism, bilateral: Secondary | ICD-10-CM | POA: Diagnosis not present

## 2021-01-05 LAB — HM DIABETES EYE EXAM

## 2021-01-13 ENCOUNTER — Ambulatory Visit: Payer: Medicare Other | Admitting: Internal Medicine

## 2021-01-25 ENCOUNTER — Ambulatory Visit
Admission: RE | Admit: 2021-01-25 | Discharge: 2021-01-25 | Disposition: A | Discharge status: Home / Self Care | Payer: Medicare Other | Source: Ambulatory Visit | Attending: Internal Medicine | Admitting: Internal Medicine

## 2021-01-25 ENCOUNTER — Other Ambulatory Visit: Payer: Self-pay

## 2021-01-25 ENCOUNTER — Other Ambulatory Visit: Payer: Medicare Other

## 2021-01-25 DIAGNOSIS — K5730 Diverticulosis of large intestine without perforation or abscess without bleeding: Secondary | ICD-10-CM | POA: Diagnosis not present

## 2021-01-25 DIAGNOSIS — G8929 Other chronic pain: Secondary | ICD-10-CM

## 2021-01-25 DIAGNOSIS — I700 Atherosclerosis of aorta: Secondary | ICD-10-CM | POA: Diagnosis not present

## 2021-01-25 DIAGNOSIS — E119 Type 2 diabetes mellitus without complications: Secondary | ICD-10-CM | POA: Diagnosis not present

## 2021-01-25 DIAGNOSIS — R109 Unspecified abdominal pain: Secondary | ICD-10-CM

## 2021-01-25 DIAGNOSIS — Z85828 Personal history of other malignant neoplasm of skin: Secondary | ICD-10-CM | POA: Diagnosis not present

## 2021-01-25 DIAGNOSIS — R0789 Other chest pain: Secondary | ICD-10-CM

## 2021-01-25 DIAGNOSIS — M545 Low back pain, unspecified: Secondary | ICD-10-CM | POA: Diagnosis not present

## 2021-01-25 MED ORDER — IOPAMIDOL (ISOVUE-300) INJECTION 61%
100.0000 mL | Freq: Once | INTRAVENOUS | Status: AC | PRN
  Administered 2021-01-25: 100 mL via INTRAVENOUS

## 2021-01-28 DIAGNOSIS — Z7409 Other reduced mobility: Secondary | ICD-10-CM | POA: Diagnosis not present

## 2021-01-28 DIAGNOSIS — Z4651 Encounter for fitting and adjustment of gastric lap band: Secondary | ICD-10-CM | POA: Diagnosis not present

## 2021-01-28 DIAGNOSIS — I480 Paroxysmal atrial fibrillation: Secondary | ICD-10-CM | POA: Diagnosis not present

## 2021-01-28 DIAGNOSIS — Z8673 Personal history of transient ischemic attack (TIA), and cerebral infarction without residual deficits: Secondary | ICD-10-CM | POA: Diagnosis not present

## 2021-01-28 DIAGNOSIS — J3801 Paralysis of vocal cords and larynx, unilateral: Secondary | ICD-10-CM | POA: Diagnosis not present

## 2021-01-28 DIAGNOSIS — G4733 Obstructive sleep apnea (adult) (pediatric): Secondary | ICD-10-CM | POA: Diagnosis not present

## 2021-01-28 DIAGNOSIS — Z9884 Bariatric surgery status: Secondary | ICD-10-CM | POA: Diagnosis not present

## 2021-01-28 DIAGNOSIS — E1169 Type 2 diabetes mellitus with other specified complication: Secondary | ICD-10-CM | POA: Diagnosis not present

## 2021-01-28 DIAGNOSIS — Z86711 Personal history of pulmonary embolism: Secondary | ICD-10-CM | POA: Diagnosis not present

## 2021-01-31 ENCOUNTER — Other Ambulatory Visit: Payer: Self-pay | Admitting: Physical Medicine & Rehabilitation

## 2021-02-07 ENCOUNTER — Other Ambulatory Visit: Payer: Self-pay | Admitting: Internal Medicine

## 2021-02-07 DIAGNOSIS — E118 Type 2 diabetes mellitus with unspecified complications: Secondary | ICD-10-CM

## 2021-02-07 MED ORDER — RYBELSUS 3 MG PO TABS
3.0000 mg | ORAL_TABLET | Freq: Every day | ORAL | 0 refills | Status: DC
Start: 2021-02-07 — End: 2021-05-24

## 2021-02-08 ENCOUNTER — Other Ambulatory Visit: Payer: Self-pay

## 2021-02-08 ENCOUNTER — Encounter (HOSPITAL_COMMUNITY): Payer: Self-pay | Admitting: Psychiatry

## 2021-02-08 ENCOUNTER — Telehealth (HOSPITAL_BASED_OUTPATIENT_CLINIC_OR_DEPARTMENT_OTHER): Payer: Medicare Other | Admitting: Psychiatry

## 2021-02-08 DIAGNOSIS — F419 Anxiety disorder, unspecified: Secondary | ICD-10-CM | POA: Diagnosis not present

## 2021-02-08 DIAGNOSIS — F330 Major depressive disorder, recurrent, mild: Secondary | ICD-10-CM

## 2021-02-08 MED ORDER — LORAZEPAM 0.5 MG PO TABS: 0.5000 mg | ORAL_TABLET | Freq: Every day | ORAL | 1 refills | Status: DC

## 2021-02-08 MED ORDER — SERTRALINE HCL 100 MG PO TABS
150.0000 mg | ORAL_TABLET | Freq: Every day | ORAL | 1 refills | Status: DC
Start: 2021-02-08 — End: 2021-08-08

## 2021-02-08 NOTE — Progress Notes (Signed)
Virtual Visit via Telephone Note  I connected with [name] on 02/08/21 at  2:00 PM EST by telephone and verified that I am speaking with the correct person using two identifiers.  Location: Patient: Home Provider: Home Office   I discussed the limitations, risks, security and privacy concerns of performing an evaluation and management service by telephone and the availability of in person appointments. I also discussed with the patient that there may be a patient responsible charge related to this service. The patient expressed understanding and agreed to proceed.   History of Present Illness: Patient is evaluated by phone session.  He is stable on his medication.  His Bell's palsy is now completely removed.  He recently seen a general surgeon at [facility] to reevaluate his gastric surgery.  Patient has procedure in the past but not he noticed continued to gain weight.  He admitted increased weight has been an issue and sometimes he is tired and not able to do things.  However his anxiety and depression is a stable and he denies any crying spells, feeling of hopelessness or sad mood.  He lives with his wife who is very supportive.  He continues to enjoy his company of 75-year-old grandchild.  His son lives close by.  Patient has no tremors, shakes or any EPS.  He likes to keep the Ativan and Zoloft at present dose.  Denies any feeling of hopelessness.  He uses BiPAP to help sleep but there are nights when he feels restless.  He gets Requip from his pulmonologist.  Past Psychiatric History: Reviewed H/O inpatient in 2006 for depression and having suicidal thoughts. Took Risperdal but d/c after stroke in March 2018.   Psychiatric Specialty Exam: Physical Exam  Review of Systems  Weight (!) 388 lb (176 kg).There is no height or weight on file to calculate BMI.  General Appearance: NA  Eye Contact:  NA  Speech:  Normal Rate  Volume:  Normal  Mood:  Euthymic  Affect:  NA  Thought Process:   Goal Directed  Orientation:  Full (Time, Place, and Person)  Thought Content:  Logical  Suicidal Thoughts:  No  Homicidal Thoughts:  No  Memory:  Immediate;   Good Recent;   Fair Remote;   Fair  Judgement:  Intact  Insight:  Present  Psychomotor Activity:  NA  Concentration:  Concentration: Fair and Attention Span: Fair  Recall:  Good  Fund of Knowledge:  Good  Language:  Good  Akathisia:  No  Handed:  Right  AIMS (if indicated):     Assets:  Communication Skills Desire for Improvement Housing Social Support  ADL's:  Intact  Cognition:  WNL  Sleep:   ok with BiPAP      Assessment and Plan: Major depressive disorder, recurrent.  Anxiety.  Patient is stable on his current medication.  He is taking Requip and trazodone prescribed by pulmonologist.  He wants to keep his current medicine which is helping his depression and anxiety.  Continue Zoloft and 50 mg daily at Ativan 0.5 mg at bedtime.  Recommended to call us back if is any question or any concern.  Follow-up in 6 months.  Follow Up Instructions:    I discussed the assessment and treatment plan with the patient. The patient was provided an opportunity to ask questions and all were answered. The patient agreed with the plan and demonstrated an understanding of the instructions.   The patient was advised to call back or seek an in-person  evaluation if the symptoms worsen or if the condition fails to improve as anticipated.  I provided 13 minutes of non-face-to-face time during this encounter.   Kathlee Nations, MD

## 2021-02-18 ENCOUNTER — Other Ambulatory Visit: Payer: Self-pay | Admitting: Internal Medicine

## 2021-02-18 DIAGNOSIS — Z23 Encounter for immunization: Secondary | ICD-10-CM | POA: Diagnosis not present

## 2021-02-18 DIAGNOSIS — E785 Hyperlipidemia, unspecified: Secondary | ICD-10-CM

## 2021-02-18 DIAGNOSIS — J309 Allergic rhinitis, unspecified: Secondary | ICD-10-CM

## 2021-02-18 DIAGNOSIS — H1010 Acute atopic conjunctivitis, unspecified eye: Secondary | ICD-10-CM

## 2021-03-07 ENCOUNTER — Telehealth: Payer: Self-pay | Admitting: Internal Medicine

## 2021-03-07 NOTE — Telephone Encounter (Signed)
Patient calling in  Says he is having severe muscle pain near upper right side of spine & the pain goes all the way down into his right arm  Patient says he has a bone spare in his neck & he is not sure if that is causing the pain or if its from an old injury  Patient wants a call back from nurse w/ provider recommendations [phone]

## 2021-03-08 NOTE — Telephone Encounter (Signed)
Attempted to call pt, unable to leave message.  Pt would need an OV to discuss symptoms and for evaluation.

## 2021-03-14 ENCOUNTER — Telehealth: Payer: Self-pay | Admitting: Internal Medicine

## 2021-03-14 ENCOUNTER — Ambulatory Visit: Payer: Medicare Other | Admitting: Internal Medicine

## 2021-03-14 NOTE — Progress Notes (Signed)
Cardiology Clinic Note   Patient Name: [name] Date of Encounter: 03/15/2021  Primary Care Provider:  Janith Lima, MD Primary Cardiologist:  Pixie Casino, MD  Patient Profile    [name] presents to the clinic today for evaluation of his increased DOE.  Past Medical History    Past Medical History:  Diagnosis Date   Anemia 07/29/2013   Asthma    childhood asthma   Benign essential HTN    Benign prostatic hypertrophy    several prostate biopsies neg for cancer.    Bilateral hip pain    Cancer (HCC)    CHF (congestive heart failure) (HCC)    Chronic anticoagulation 1/54/0086   Complication of anesthesia    MORPHINE CAUSES HALLUCINATIONS   CVA (cerebral vascular accident) ([place]) 06/2016   Depression    Diastolic congestive heart failure ([place]) 07/16/2013   Dysphagia, post-stroke    Embolic cerebral infarction ([place]) 06/21/2016   FH: colonic polyps 2010   Gout    History of kidney stones    Hypertension    Kidney cysts    Morbid obesity ([place])    Numbness    feet / legs   PAF (paroxysmal atrial fibrillation) with RVR 07/29/2013   Pericarditis 2015   s/p window   Recurrent pulmonary emboli ([place]) 06/13/2015   Sleep apnea    USES C PAP   Stroke syndrome    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   Past Surgical History:  Procedure Laterality Date   ARTHROSCOPY KNEE W/ DRILLING     BREATH TEK H PYLORI N/A 12/23/2012   Procedure: BREATH TEK H PYLORI;  Surgeon: Gayland Curry, MD;  Location: [facility];  Service: General;  Laterality: N/A;   [place] N/A 07/31/2013   Procedure: COLONOSCOPY;  Surgeon: Gatha Mayer, MD;  Location: [facility];  Service: Endoscopy;  Laterality: N/A;   ESOPHAGOGASTRODUODENOSCOPY N/A 07/22/2013   Procedure: ESOPHAGOGASTRODUODENOSCOPY (EGD);  Surgeon: Irene Shipper, MD;  Location: [facility] ENDOSCOPY;  Service: Endoscopy;  Laterality: N/A;   ESOPHAGOGASTRODUODENOSCOPY N/A 07/31/2013   Procedure:  ESOPHAGOGASTRODUODENOSCOPY (EGD);  Surgeon: Gatha Mayer, MD;  Location: [facility] ENDOSCOPY;  Service: Endoscopy;  Laterality: N/A;   INTRAOPERATIVE TRANSESOPHAGEAL ECHOCARDIOGRAM N/A 07/18/2013   Procedure: INTRAOPERATIVE TRANSESOPHAGEAL ECHOCARDIOGRAM;  Surgeon: Grace Isaac, MD;  Location: [facility];  Service: Open Heart Surgery;  Laterality: N/A;   LAPAROSCOPIC GASTRIC BANDING WITH HIATAL HERNIA REPAIR N/A 04/08/2013   Procedure: LAPAROSCOPIC GASTRIC BANDING WITH possible  HIATAL HERNIA REPAIR;  Surgeon: Gayland Curry, MD;  Location: [facility]S;  Service: General;  Laterality: N/A;   LITHOTRIPSY     renal calculi     SUBXYPHOID PERICARDIAL WINDOW N/A 07/18/2013   Procedure: SUBXYPHOID PERICARDIAL WINDOW;  Surgeon: Grace Isaac, MD;  Location: [facility] OR;  Service: Thoracic;  Laterality: N/A;   TONSILLECTOMY     total knee replacment     bilat   trigger finger repair     also lue, cts surgically repaired   WISDOM TOOTH EXTRACTION      Allergies  Allergies  Allergen Reactions   Morphine Nausea And Vomiting and Other (See Comments)    Severe hallucinations Other reaction(s): Hallucinations   Penicillins Other (See Comments)    Passed out after injection Did it involve swelling of the face/tongue/throat, SOB, or low BP? Unknown Did it involve sudden or severe rash/hives, skin peeling, or any reaction on the inside of your mouth or nose? No Did  you need to seek medical attention at a hospital or doctor's office? Yes When did it last happen?    teenager   If all above answers are "NO", may proceed with cephalosporin use. Other reaction(s): Dizziness, Unknown   Darvon [Propoxyphene]     Other reaction(s): Unknown   Morphine Sulfate     Other reaction(s): Unknown   Codeine Nausea Only    Other reaction(s): Unknown   Propoxyphene N-Acetaminophen Nausea Only    History of Present Illness    [name] has a PMH of OSA, GERD, dysphagia poststroke, type 2 diabetes, peripheral  neuropathy, BPH, morbid obesity, paroxysmal atrial fibrillation, diastolic CHF, recurrent pulmonary emboli, essential hypertension, and spontaneous ecchymosis.  He is status post lap band procedure.  He was seen and evaluated by Dr. Debara Pickett on 06/10/2020.  During that time he return for routine follow-up.  He continued to be overweight.  His weight was 390 pounds.  He was noting dyspnea with minimal exertion.  He denied recurrent episodes of atrial fibrillation.  He was taking flecainide and apixaban.  He was trying to lose weight with dietary approaches.  He was offered referral to healthy weight and wellness and he declined.  He contacted his PCP 03/12/2021 with complaints of increased dyspnea with exertion.  His torsemide was increased.  He was instructed to contact cardiology for further management and evaluation.  He presents to the clinic today for further evaluation and states he contacted his PCP on Sunday and indicated that he was having increased work of breathing.  His torsemide was doubled.  He reports that he also saw bariatric surgery 1 month ago and at that time he was about 400 pounds.  He does have some generalized bilateral lower extremity nonpitting edema.  He also has a open right shin ulcer and 2 anterior shin scabs that are 5 cm x 2-1/2 cm.  I will refer him to wound care.  Have him continue to follow-up with bariatric surgery, follow-up with pulmonology, and repeat his echocardiogram.  We will also draw a BMP and CBC.  I will give him the salty 6 diet sheet, chair exercises, and plan follow-up after his echocardiogram.  Today he denies chest pain,  palpitations, melena, hematuria, hemoptysis, diaphoresis, weakness, presyncope, syncope, orthopnea, and PND.   Home Medications    Prior to Admission medications   Medication Sig Start Date End Date Taking? Authorizing Provider  atorvastatin (LIPITOR) 80 MG tablet TAKE 1 TABLET(80 MG) BY MOUTH DAILY 02/18/21   Janith Lima, MD  [facility]  MELATONIN GUMMIES PO Take by mouth.    [provider]  Docusate Sodium (DSS) 100 MG CAPS Take 100 mg by mouth every other day. 07/25/13   Kelvin Cellar, MD  ELIQUIS 5 MG TABS tablet TAKE 1 TABLET(5 MG) BY MOUTH TWICE DAILY 11/29/20   Janith Lima, MD  finasteride (PROSCAR) 5 MG tablet Take 1 tablet (5 mg total) by mouth daily. 07/12/16   Angiulli, Lavon Paganini, PA-C  flecainide (TAMBOCOR) 50 MG tablet Take 1 tablet (50 mg total) by mouth every 12 (twelve) hours. 06/10/20   [name], [name], MD  loratadine (CLARITIN) 10 MG tablet Take 10 mg by mouth daily.    [provider]  LORazepam (ATIVAN) 0.5 MG tablet Take 1 tablet (0.5 mg total) by mouth at bedtime. 02/08/21 02/08/22  Arfeen, Arlyce Harman, MD  methylPREDNISolone (MEDROL DOSEPAK) 4 MG TBPK tablet See admin instructions. follow package directions 08/20/20   [provider]  montelukast (SINGULAIR) 10 MG tablet TAKE 1 TABLET(10 MG) BY MOUTH AT BEDTIME 02/18/21   Janith Lima, MD  Multiple Vitamin (MULTIVITAMIN WITH MINERALS) TABS tablet Take 1 tablet by mouth daily.    [provider]  ofloxacin (OCUFLOX) 0.3 % ophthalmic solution Place 1 drop into the right eye 4 (four) times daily. 11/03/20   McDonald, Mia A, PA-C  pantoprazole (PROTONIX) 40 MG tablet TAKE 1 TABLET(40 MG) BY MOUTH DAILY 07/28/20   Janith Lima, MD  potassium chloride SA (KLOR-CON) 20 MEQ tablet TAKE 1 TABLET BY MOUTH TWICE DAILY 06/10/20   [name], [name], MD  rOPINIRole (REQUIP) 0.5 MG tablet Take 1 tablet (0.5 mg total) by mouth 3 (three) times daily. 10/26/20   Young, [name], MD  Semaglutide (RYBELSUS) 3 MG TABS Take 3 mg by mouth daily. 02/07/21   Janith Lima, MD  sertraline (ZOLOFT) 100 MG tablet Take 1.5 tablets (150 mg total) by mouth at bedtime. 02/08/21   Arfeen, Arlyce Harman, MD  torsemide (DEMADEX) 20 MG tablet TAKE 3 TABLETS BY MOUTH ONCE DAILY. 06/10/20   [name], [name], MD  traMADol (ULTRAM) 50 MG tablet TAKE 1 TABLET(50 MG) BY MOUTH  EVERY 8 HOURS AS NEEDED 01/31/21   Kirsteins, Luanna Salk, MD  traZODone (DESYREL) 50 MG tablet 1 to 3 tabs at bedtime for sleep as needed 07/19/20   Deneise Lever, MD    Family History    Family History  Problem Relation Age of Onset   Depression Mother    Hypertension Mother    Dementia Mother    Heart disease Father        MI   Depression Brother    Stroke Paternal Aunt        CVA   Diabetes Paternal Uncle    Heart disease Paternal Uncle        MI   Heart disease Paternal Grandmother        MI   Diabetes Cousin        PATERNAL   He indicated that his mother is alive. He indicated that his father is deceased. He indicated that both of his brothers are alive. He indicated that the status of his paternal grandmother is unknown. He indicated that his son is alive. He indicated that the status of his paternal aunt is unknown. He indicated that the status of his paternal uncle is unknown. He indicated that the status of his cousin is unknown.  Social History    Social History   Socioeconomic History   Marital status: Married    Spouse name: Not on file   Number of children: 1   Years of education: Not on file   Highest education level: Not on file  Occupational History   Occupation: retired-Manager [employer]  Tobacco Use   Smoking status: Former    Packs/day: 0.20    Years: 13.00    Pack years: 2.60    Types: Cigarettes    Start date: 04/03/1961    Quit date: 08/30/1974    Years since quitting: 46.5   Smokeless tobacco: Never  Vaping Use   Vaping Use: Never used  Substance and Sexual Activity   Alcohol use: No    Alcohol/week: 0.0 standard drinks   Drug use: No   Sexual activity: Not Currently  Other Topics Concern   Not on file  Social History Narrative   Lives at home w/ his wife   Right-handed   Caffeine: decaf coffee, occasional  tea   Social Determinants of Radio broadcast assistant Strain: Low Risk    Difficulty of Paying Living Expenses: Not hard at  all  Food Insecurity: No Food Insecurity   Worried About Charity fundraiser in the Last Year: Never true   Ran Out of Food in the Last Year: Never true  Transportation Needs: No Transportation Needs   Lack of Transportation (Medical): No   Lack of Transportation (Non-Medical): No  Physical Activity: Inactive   Days of Exercise per Week: 0 days   Minutes of Exercise per Session: 0 min  Stress: No Stress Concern Present   Feeling of Stress : Not at all  Social Connections: Socially Integrated   Frequency of Communication with Friends and Family: More than three times a week   Frequency of Social Gatherings with Friends and Family: More than three times a week   Attends Religious Services: 1 to 4 times per year   Active Member of [employer] or Organizations: Yes   Attends Archivist Meetings: 1 to 4 times per year   Marital Status: Married  Human resources officer Violence: Not At Risk   Fear of Current or Ex-Partner: No   Emotionally Abused: No   Physically Abused: No   Sexually Abused: No     Review of Systems    General:  No chills, fever, night sweats or weight changes.  Cardiovascular:  No chest pain, dyspnea on exertion, edema, orthopnea, palpitations, paroxysmal nocturnal dyspnea. Dermatological: No rash, lesions/masses Respiratory: No cough, dyspnea Urologic: No hematuria, dysuria Abdominal:   No nausea, vomiting, diarrhea, bright red blood per rectum, melena, or hematemesis Neurologic:  No visual changes, wkns, changes in mental status. All other systems reviewed and are otherwise negative except as noted above.  Physical Exam    VS:  BP 130/62 (BP Location: Left Arm, Patient Position: Sitting, Cuff Size: Large)   Pulse 79   Ht 6' (1.829 m)   Wt (!) 381 lb (172.8 kg)   BMI 51.67 kg/m  , BMI Body mass index is 51.67 kg/m. GEN: Well nourished, well developed, in no acute distress. HEENT: normal. Neck: Supple, no JVD, carotid bruits, or masses. Cardiac: RRR, no  murmurs, rubs, or gallops. No clubbing, cyanosis, edema.  Radials/DP/PT 2+ and equal bilaterally.  Respiratory:  Respirations regular and unlabored, clear to auscultation bilaterally. GI: Soft, nontender, nondistended, BS + x 4. MS: no deformity or atrophy. Skin: warm and dry, no rash.open ulcer 2 and half centimeters by 2 and half centimeters.  Also has 2 healing anterior shin scabs 5 cm x 2-1/2 cm. Neuro:  Strength and sensation are intact. Psych: Normal affect.  Accessory Clinical Findings    Recent Labs: 11/02/2020: ALT 17 11/09/2020: BUN 36; Creatinine, Ser 1.26; Magnesium 2.0; Potassium 3.5; Sodium 145; TSH 2.43 12/30/2020: Hemoglobin 13.7; Platelets 242.0   Recent Lipid Panel    Component Value Date/Time   CHOL 149 11/09/2020 1502   TRIG 139.0 11/09/2020 1502   HDL 44.20 11/09/2020 1502   CHOLHDL 3 11/09/2020 1502   VLDL 27.8 11/09/2020 1502   LDLCALC 77 11/09/2020 1502   LDLCALC 74 10/27/2019 1400   LDLDIRECT 91.0 05/16/2018 1616    ECG personally reviewed by me today-normal sinus rhythm right bundle branch block T wave abnormality consider inferolateral ischemia 79 bpm- No acute changes  Echocardiogram 10/10/2018 IMPRESSIONS     1. The left ventricle has normal systolic function with an ejection  fraction of 60-65%. The cavity size was normal.  Left ventricular diastolic  Doppler parameters are consistent with impaired relaxation.   2. The right ventricle has normal systolic function. The cavity was  normal. There is no increase in right ventricular wall thickness.   3. The mitral valve is grossly normal.   4. No stenosis of the aortic valve.   5. The interatrial septum was not assessed.   Assessment & Plan   1.  DOE-contacted nurse triage line 03/14/2021 and indicated he was having increased shortness of breath.  His torsemide was increased by his PCP on 03/12/2021.  Previous echocardiogram 10/10/2018 showed normal results.  DOE previously felt to be related to  weight. Healthy weight and wellness clinic/bariatric weight center Heart healthy low-sodium diet Increase physical activity as tolerated  Diastolic CHF-generalized bilateral lower extremity swelling.  Has noticed increased DOE with increased physical activity.  Weight today 381.  He has lost about 20 pounds in the last week.  Tolerating increased torsemide dosing well.  Breathing has been unchanged. Continue torsemide, potassium Repeat BMP Daily weights-contact office with a weight increase of 3 pounds overnight or 5 pounds in 1 week Increase physical activity as tolerated Repeat echocardiogram  Hyperlipidemia-11/09/2020: Cholesterol 149; HDL 44.20; LDL Cholesterol 77; Triglycerides 139.0; VLDL 27.8 Continue atorvastatin Heart healthy low-sodium high-fiber diet increase physical activity as tolerated  Lower extremity wound-has a new open ulcer 2 and half centimeters by 2 and half centimeters.  Also has 2 healing anterior shin scabs 5 cm x 2-1/2 cm. Refer to wound care  Paroxysmal atrial fibrillation-EKG today shows normal sinus rhythm right bundle branch block 79 bpm.  Denies recent episodes of accelerated or irregular heart rate. Continue apixaban, flecainide, Heart healthy low-sodium diet-salty 6 given Increase physical activity as tolerated  Recurrent pulmonary emboli-on apixaban and reports compliance.  Denies bleeding issues. Order CBC  Morbid obesity-weight today 381.  Appears to be contributing to increased dyspnea. Encouraged increase physical activity and weight loss   Disposition: Follow-up with Dr. Debara Pickett or me after echocardiogram.   [name] Ng. Erasto Sleight NP-C    03/15/2021, 4:16 PM [facility] [address] Office [phone] Fax [phone]  Notice: This dictation was prepared with Dragon dictation along with smaller phrase technology. Any transcriptional errors that result from this process are unintentional and may not be  corrected upon review.  I spent 14 minutes examining this patient, reviewing medications, and using patient centered shared decision making involving her cardiac care.  Prior to her visit I spent greater than 20 minutes reviewing her past medical history,  medications, and prior cardiac tests.

## 2021-03-14 NOTE — Telephone Encounter (Signed)
Spoke with patient about message received.  Scheduled visit with Denyse Amass NP on 03/15/21 @ 3:35pm Advised to continue torsemide dose per MD as noted below.  Advised to bring log of vitals to visit tomorrow He reports concerns about low O2, said he has seen it below 90 Advised if O2 drops and he is SOB, should call EMS for eval and transport to hospital if needed

## 2021-03-14 NOTE — Telephone Encounter (Signed)
-----   Message from Pixie Casino, MD sent at 03/14/2021  7:49 AM EST ----- Thanks .[name] we'll reach out to him for follow-up.  -[place]  ----- Message ----- From: Tannenbaum, [place], MD Sent: 03/12/2021   7:30 PM EST To: Pixie Casino, MD  Called by Dr Loma Newton today about worsening dyspnea/orthopnea. Vitals including pulse-ox acceptable Asked him to increase his torsemide to BID and to call in on Monday to set up an appointment and gave indications for needing to present to the ED -[place] Tannenbaum

## 2021-03-15 ENCOUNTER — Other Ambulatory Visit: Payer: Self-pay

## 2021-03-15 ENCOUNTER — Ambulatory Visit (INDEPENDENT_AMBULATORY_CARE_PROVIDER_SITE_OTHER): Payer: Medicare Other | Admitting: General Practice

## 2021-03-15 ENCOUNTER — Encounter: Payer: Self-pay | Admitting: General Practice

## 2021-03-15 VITALS — BP 130/62 | HR 79 | Ht 72.0 in | Wt 381.0 lb

## 2021-03-15 DIAGNOSIS — I480 Paroxysmal atrial fibrillation: Secondary | ICD-10-CM

## 2021-03-15 DIAGNOSIS — I2699 Other pulmonary embolism without acute cor pulmonale: Secondary | ICD-10-CM

## 2021-03-15 DIAGNOSIS — Z79899 Other long term (current) drug therapy: Secondary | ICD-10-CM | POA: Diagnosis not present

## 2021-03-15 DIAGNOSIS — I5032 Chronic diastolic (congestive) heart failure: Secondary | ICD-10-CM | POA: Diagnosis not present

## 2021-03-15 DIAGNOSIS — L97819 Non-pressure chronic ulcer of other part of right lower leg with unspecified severity: Secondary | ICD-10-CM

## 2021-03-15 DIAGNOSIS — R0609 Other forms of dyspnea: Secondary | ICD-10-CM

## 2021-03-15 DIAGNOSIS — E785 Hyperlipidemia, unspecified: Secondary | ICD-10-CM | POA: Diagnosis not present

## 2021-03-15 NOTE — Patient Instructions (Signed)
Medication Instructions:  .CONT *If you need a refill on your cardiac medications before your next appointment, please call your pharmacy*  Lab Work: [place] If you have labs (blood work) drawn today and your tests are completely normal, you will receive your results only by:  [place] (if you have MyChart) OR A paper copy in the mail.  If you have any lab test that is abnormal or we need to change your treatment, we will call you to review the results. You may go to any [facility] that is convenient for you however, we do have a lab in our office that is able to assist you. You DO NOT need an appointment for our lab. The lab is open 8:00am and closes at 4:00pm. Lunch 12:45 - 1:45pm.  Testing/Procedures:Echocardiogram - Your physician has requested that you have an echocardiogram. Echocardiography is a painless test that uses sound waves to create images of your heart. It provides your doctor with information about the size and shape of your heart and how well your hearts chambers and valves are working. This procedure takes approximately one hour. There are no restrictions for this procedure. This will be performed at either our [facility] location - [address] [place] location [facility] 2nd floor.  Special Instructions REFERRAL TO WOUND CARE FOR RIGHT SHIN ULCER  MAKE SOONER APPT WITH PULMONARY  READ AND FOLLOW CHAIR EXERCISES  CONTINUE WEIGHT LOSS  Follow-Up: Your next appointment:  AFTER ECHO  In Person with Pixie Casino, MD    :1  At [facility], you and your health needs are our priority.  As part of our continuing mission to provide you with exceptional heart care, we have created designated Provider Care Teams.  These Care Teams include your primary Cardiologist (physician) and Advanced Practice Providers (APPs -  Physician Assistants and Nurse Practitioners) who all work together to provide you with the care you need, when  you need it.    Exercises to do While Sitting Exercises that you do while sitting (chair exercises) can give you many of the same benefits as full exercise. Benefits include strengthening your heart, burning calories, and keeping muscles and joints healthy. Exercise can also improve your mood and help with depression and anxiety. You may benefit from chair exercises if you are unable to do standing exercises due to: Diabetic foot pain. Obesity. Illness. Arthritis. Recovery from surgery or injury. Breathing problems. Balance problems. Another type of disability. Before starting chair exercises, check with your health care provider or a physical therapist to find out how much exercise you can tolerate and which exercises are safe for you. If your health care provider approves: Start out slowly and build up over time. Aim to work up to about 10-20 minutes for each exercise session. Make exercise part of your daily routine. Drink water when you exercise. Do not wait until you are thirsty. Drink every 10-15 minutes. Stop exercising right away if you have pain, nausea, shortness of breath, or dizziness. If you are exercising in a wheelchair, make sure to lock the wheels. Ask your health care provider whether you can do tai chi or yoga. Many positions in these mind-body exercises can be modified to do while seated. Warm-up Before starting other exercises: Sit up as straight as you can. Have your knees bent at 90 degrees, which is the shape of the capital letter "L." Keep your feet flat on the floor. Sit at  the front edge of your chair, if you can. Pull in (tighten) the muscles in your abdomen and stretch your spine and neck as straight as you can. Hold this position for a few minutes. Breathe in and out evenly. Try to concentrate on your breathing, and relax your mind. Stretching Exercise A: Arm stretch Hold your arms out straight in front of your body. Bend your hands at the wrist with your  fingers pointing up, as if signaling someone to stop. Notice the slight tension in your forearms as you hold the position. Keeping your arms out and your hands bent, rotate your hands outward as far as you can and hold this stretch. Aim to have your thumbs pointing up and your pinkie fingers pointing down. Slowly repeat arm stretches for one minute as tolerated. Exercise B: Leg stretch If you can move your legs, try to "draw" letters on the floor with the toes of your foot. Write your name with one foot. Write your name with the toes of your other foot. Slowly repeat the movements for one minute as tolerated. Exercise C: Reach for the sky Reach your hands as far over your head as you can to stretch your spine. Move your hands and arms as if you are climbing a rope. Slowly repeat the movements for one minute as tolerated. Range of motion exercises Exercise A: Shoulder roll Let your arms hang loosely at your sides. Lift just your shoulders up toward your ears, then let them relax back down. When your shoulders feel loose, rotate your shoulders in backward and forward circles. Do shoulder rolls slowly for one minute as tolerated. Exercise B: March in place As if you are marching, pump your arms and lift your legs up and down. Lift your knees as high as you can. If you are unable to lift your knees, just pump your arms and move your ankles and feet up and down. March in place for one minute as tolerated. Exercise C: Seated jumping jacks Let your arms hang down straight. Keeping your arms straight, lift them up over your head. Aim to point your fingers to the ceiling. While you lift your arms, straighten your legs and slide your heels along the floor to your sides, as wide as you can. As you bring your arms back down to your sides, slide your legs back together. If you are unable to use your legs, just move your arms. Slowly repeat seated jumping jacks for one minute as  tolerated. Strengthening exercises Exercise A: Shoulder squeeze Hold your arms straight out from your body to your sides, with your elbows bent and your fists pointed at the ceiling. Keeping your arms in the bent position, move them forward so your elbows and forearms meet in front of your face. Open your arms back out as wide as you can with your elbows still bent, until you feel your shoulder blades squeezing together. Hold for 5 seconds. Slowly repeat the movements forward and backward for one minute as tolerated. Contact a health care provider if: You have to stop exercising due to any of the following: Pain. Nausea. Shortness of breath. Dizziness. Fatigue. You have significant pain or soreness after exercising. Get help right away if: You have chest pain. You have difficulty breathing. These symptoms may represent a serious problem that is an emergency. Do not wait to see if the symptoms will go away. Get medical help right away. Call your local emergency services (911 in the [place] Do not drive yourself  to the hospital. Summary Exercises that you do while sitting (chair exercises) can strengthen your heart, burn calories, and keep muscles and joints healthy. You may benefit from chair exercises if you are unable to do standing exercises due to diabetic foot pain, obesity, recovery from surgery or injury, or other conditions. Before starting chair exercises, check with your health care provider or a physical therapist to find out how much exercise you can tolerate and which exercises are safe for you. This information is not intended to replace advice given to you by your health care provider. Make sure you discuss any questions you have with your health care provider. Document Revised: 05/16/2020 Document Reviewed: 05/16/2020 Elsevier Patient Education  2022 [employer].

## 2021-03-16 ENCOUNTER — Telehealth: Payer: Medicare Other

## 2021-03-17 DIAGNOSIS — E785 Hyperlipidemia, unspecified: Secondary | ICD-10-CM | POA: Diagnosis not present

## 2021-03-17 DIAGNOSIS — I480 Paroxysmal atrial fibrillation: Secondary | ICD-10-CM | POA: Diagnosis not present

## 2021-03-17 DIAGNOSIS — I2699 Other pulmonary embolism without acute cor pulmonale: Secondary | ICD-10-CM | POA: Diagnosis not present

## 2021-03-17 DIAGNOSIS — R0609 Other forms of dyspnea: Secondary | ICD-10-CM | POA: Diagnosis not present

## 2021-03-17 DIAGNOSIS — I5032 Chronic diastolic (congestive) heart failure: Secondary | ICD-10-CM | POA: Diagnosis not present

## 2021-03-17 DIAGNOSIS — Z79899 Other long term (current) drug therapy: Secondary | ICD-10-CM | POA: Diagnosis not present

## 2021-03-18 ENCOUNTER — Other Ambulatory Visit: Payer: Self-pay

## 2021-03-18 DIAGNOSIS — R0609 Other forms of dyspnea: Secondary | ICD-10-CM

## 2021-03-18 DIAGNOSIS — I5032 Chronic diastolic (congestive) heart failure: Secondary | ICD-10-CM

## 2021-03-18 DIAGNOSIS — Z79899 Other long term (current) drug therapy: Secondary | ICD-10-CM

## 2021-03-18 LAB — BASIC METABOLIC PANEL
BUN/Creatinine Ratio: 19 (ref 10–24)
BUN: 23 mg/dL (ref 8–27)
CO2: 30 mmol/L — ABNORMAL HIGH (ref 20–29)
Calcium: 8.9 mg/dL (ref 8.6–10.2)
Chloride: 98 mmol/L (ref 96–106)
Creatinine, Ser: 1.18 mg/dL (ref 0.76–1.27)
Glucose: 133 mg/dL — ABNORMAL HIGH (ref 70–99)
Potassium: 3.6 mmol/L (ref 3.5–5.2)
Sodium: 147 mmol/L — ABNORMAL HIGH (ref 134–144)
eGFR: 64 mL/min/{1.73_m2} (ref >59)

## 2021-03-18 LAB — CBC
Hematocrit: 42.8 % (ref 37.5–51.0)
Hemoglobin: 14 g/dL (ref 13.0–17.7)
MCH: 29 pg (ref 26.6–33.0)
MCHC: 32.7 g/dL (ref 31.5–35.7)
MCV: 89 fL (ref 79–97)
Platelets: 268 10*3/uL (ref 150–450)
RBC: 4.82 x10E6/uL (ref 4.14–5.80)
RDW: 12.9 % (ref 11.6–15.4)
WBC: 9.3 10*3/uL (ref 3.4–10.8)

## 2021-03-20 ENCOUNTER — Encounter: Payer: Self-pay | Admitting: Internal Medicine

## 2021-03-21 ENCOUNTER — Telehealth: Payer: Self-pay

## 2021-03-21 NOTE — Telephone Encounter (Signed)
See phone encounter from 03/21/21

## 2021-03-21 NOTE — Telephone Encounter (Signed)
My Chart message form pt:       [name], [name] "[name]"  [facility] Pool Phone Number: [phone]   I HAVE BEEN HAVING SHORTNESS OF BREATH FROM SIMPLY WALKING THROUGH MY HOUSE OR WALKING TO OR FROM MY VEHICLE.  WALKING INTO MY HOME TONIGHT I WAS SO OUT OF BREATH THAT I HAD TO SIT DOWN AND RECOVER BEFORE I COULD CONTINUE.  MY OXIMETER READ 74 DURING THE WORST OF IT BUT OVER 5 OR 10 MINUTES IT RETURNED TO MID 90s.   THIS HAS BEEN GOING ON FOR A COUPLE OF MONTHS.   I CONTACTED DR HILTYs OFFICE BECAUSE HE ORIGINALLY PRESCRIBED MY TORSEMIDE. MY DOSE OF 20 MG (3) TABLETS PER DAY.  THEY DOUBLED MY DAILY DOSE UNTIL I COULD GET AN APPOINTMENT.  I GUESSED I HAD FLUID RETENTION AND MIGHT NEED OXYGEN. AFTER BLOOD TESTS THEY PUT ME BACK ON MY ORIGINAL DOSE AND NO OXYGEN WAS PRESCRIBED.  I WAS TOLD TO CONTACT YOUR OFFICE.   I STILL HAVE MORE APPOINTMENTS WITH DR HILTYs  OFFICE.  I NEED TO DO SOMETHING ABOUT THIS SITUATION SO I CAN BREATHE BETTER. I ALSO HAVE APPOINTMENTS WITH DR Kalman Drape AT [facility] TO GET HIS HELP TO LOSE SIGNIFICANTLY MORE WEIGHT. NOT SURE WHAT TO DO BUT MAKE  AN APPOINTMENT WITH YOU.  [place], [place], 20 March 2021.     Spoke with pt who states he has seen is O2 saturation drop to 74% on RA after short exertion. Pt does state that after sitting for 5 - 10 mins he recovers to mid 90's were he usually stays at rest. Pt states that is currently over weight and has sought out help with SOB. He was on a dieretic for a short amount of time but is no longer on it.  Pt instructed if O2 saturation were to drop again he would need to seek emergent medical evaluation. Pt stated understanding. Pt was scheduled with Geraldo Pitter on 03/22/21 at 11:30 am for evaluation and possible qualifying walk it needed. Nothing further needed at this time.   Routing to [employer]  as Juluis Rainier

## 2021-03-22 ENCOUNTER — Ambulatory Visit (HOSPITAL_BASED_OUTPATIENT_CLINIC_OR_DEPARTMENT_OTHER)
Admission: RE | Admit: 2021-03-22 | Discharge: 2021-03-22 | Disposition: A | Discharge status: Home / Self Care | Payer: Medicare Other | Source: Ambulatory Visit | Attending: Primary Care | Admitting: Primary Care

## 2021-03-22 ENCOUNTER — Ambulatory Visit (INDEPENDENT_AMBULATORY_CARE_PROVIDER_SITE_OTHER): Payer: Medicare Other | Admitting: Primary Care

## 2021-03-22 ENCOUNTER — Encounter: Payer: Self-pay | Admitting: Primary Care

## 2021-03-22 ENCOUNTER — Encounter (HOSPITAL_BASED_OUTPATIENT_CLINIC_OR_DEPARTMENT_OTHER): Payer: Self-pay

## 2021-03-22 ENCOUNTER — Ambulatory Visit (INDEPENDENT_AMBULATORY_CARE_PROVIDER_SITE_OTHER): Payer: Medicare Other

## 2021-03-22 ENCOUNTER — Telehealth: Payer: Self-pay | Admitting: Primary Care

## 2021-03-22 ENCOUNTER — Other Ambulatory Visit: Payer: Self-pay

## 2021-03-22 VITALS — BP 142/64 | HR 76 | Temp 97.7°F | Ht 72.0 in | Wt 380.8 lb

## 2021-03-22 DIAGNOSIS — J9611 Chronic respiratory failure with hypoxia: Secondary | ICD-10-CM | POA: Diagnosis not present

## 2021-03-22 DIAGNOSIS — L03115 Cellulitis of right lower limb: Secondary | ICD-10-CM

## 2021-03-22 DIAGNOSIS — R0602 Shortness of breath: Secondary | ICD-10-CM | POA: Insufficient documentation

## 2021-03-22 DIAGNOSIS — I2699 Other pulmonary embolism without acute cor pulmonale: Secondary | ICD-10-CM | POA: Diagnosis not present

## 2021-03-22 DIAGNOSIS — J189 Pneumonia, unspecified organism: Secondary | ICD-10-CM | POA: Diagnosis not present

## 2021-03-22 DIAGNOSIS — R7989 Other specified abnormal findings of blood chemistry: Secondary | ICD-10-CM | POA: Diagnosis not present

## 2021-03-22 DIAGNOSIS — R0902 Hypoxemia: Secondary | ICD-10-CM | POA: Diagnosis not present

## 2021-03-22 DIAGNOSIS — R0609 Other forms of dyspnea: Secondary | ICD-10-CM | POA: Diagnosis not present

## 2021-03-22 DIAGNOSIS — I5032 Chronic diastolic (congestive) heart failure: Secondary | ICD-10-CM | POA: Diagnosis not present

## 2021-03-22 DIAGNOSIS — R0600 Dyspnea, unspecified: Secondary | ICD-10-CM | POA: Diagnosis not present

## 2021-03-22 DIAGNOSIS — I509 Heart failure, unspecified: Secondary | ICD-10-CM | POA: Diagnosis not present

## 2021-03-22 LAB — BRAIN NATRIURETIC PEPTIDE: Pro B Natriuretic peptide (BNP): 71 pg/mL (ref 0.0–100.0)

## 2021-03-22 LAB — D-DIMER, QUANTITATIVE: D-Dimer, Quant: 0.57 mcg/mL FEU — ABNORMAL HIGH (ref <0.50)

## 2021-03-22 MED ORDER — PREDNISONE 10 MG PO TABS: ORAL_TABLET | ORAL | 0 refills | Status: DC

## 2021-03-22 MED ORDER — IOHEXOL 350 MG/ML SOLN
80.0000 mL | Freq: Once | INTRAVENOUS | Status: AC | PRN
  Administered 2021-03-22: 18:00:00 80 mL via INTRAVENOUS

## 2021-03-22 MED ORDER — DOXYCYCLINE HYCLATE 100 MG PO TABS: 100.0000 mg | ORAL_TABLET | Freq: Two times a day (BID) | ORAL | 0 refills | Status: DC

## 2021-03-22 NOTE — Progress Notes (Addendum)
CXR showed increased interstitial markings at lung bases R>L with associated mild bilateral airway thickening. I will send in course of doxycycline and prednisone taper to cover possible pneumonia/aspiration pneumonitis but also cellulitis he has on right lower leg

## 2021-03-22 NOTE — Assessment & Plan Note (Addendum)
-   New; O2 at home dropped to 75%, it takes him 5-10 mins to recover  - Patient needs 2L supplemental oxygen with moderate exertion to maintain O2 >90%  - Checking ONO on BIPAP to assess nocturnal oxygen need - Dme order placed as high priority

## 2021-03-22 NOTE — Assessment & Plan Note (Signed)
-   Continue Eliquis 5mg  twice daily per cardiology  - Checking D-dimer

## 2021-03-22 NOTE — Progress Notes (Signed)
I sent you results note on this patient's CXR which showed possible pna, sent in abx. D-dimer was elevated, needs stat CTA

## 2021-03-22 NOTE — Addendum Note (Signed)
Addended by: Martyn Ehrich on: 03/22/2021 02:29 PM   Modules accepted: Orders

## 2021-03-22 NOTE — Patient Instructions (Addendum)
Recommendations: - Wear 2L oxygen with exertion  - We will check overnight oximetry at night while on BIPAP to see if you need oxygen at bedtime  - Continue Torsemide 60mg  daily per cardiology - Continue Eliquis 5mg  twice daily per cardiology   Orders: - New Oxygen start - 2L with exertion and at bedtime (eval for POC) - ONO on BIPAP  - CXR and Labs today (re: sob)  Follow-up: - First available with Dr. Annamaria Boots     Home Oxygen Use, Adult When a medical condition keeps you from getting enough oxygen, your health care provider may instruct you to take extra oxygen at home. Your health care provider will let you know: When to take oxygen. How long to take oxygen. How quickly oxygen should be delivered (flow rate), in liters per minute (LPM or L/M). Home oxygen can be given through: A mask. A nasal cannula. This is a device or tube that goes in the nostrils. A transtracheal catheter. This is a small, thin tube placed in the windpipe (trachea). A breathing tube (tracheostomy tube) that is surgically placed in the windpipe. This may be used in severe cases. These devices are connected with tubing to an oxygen source, such as: A tank. Tanks hold oxygen in gas form. They must be replaced when the oxygen is used up. A liquid oxygen device. This holds oxygen in liquid form. Liquid oxygen is very cold. It must be replaced when the oxygen is used up. An oxygen concentrator machine. This filters oxygen in the room. There are two types of oxygen concentrator machines--stationary and portable. A stationary oxygen concentrator machine plugs into the main electricity supply at your home. You must have a backup cylinder of oxygen in case the power goes out. A portable oxygen concentrator machine is smaller in size and more lightweight. This machine uses battery supply and can be used outside the home. Work with your health care provider to find equipment that works best for you and your lifestyle. What  are the risks? Delivery of supplemental oxygen is generally safe. However, some risks include: Fire. This can happen if the oxygen is exposed to a heat source, flame, or spark. Injury to skin. This can happen if liquid oxygen touches your skin. Damage to the lungs or other organs. This can happen from getting too little or too much oxygen. Supplies needed: To use oxygen, you will need: A mask, nasal cannula, transtracheal catheter, or tracheostomy. An oxygen tank, a liquid oxygen device, or an oxygen concentrator. The tape that your health care provider recommends (optional). Your health care provider may also recommend: A humidifier to warm and moisten the oxygen delivered. This will depend on how much oxygen you need and the type of home oxygen device you use. A pulse oximeter. This device measures the percentage of oxygen in your blood. How to use oxygen Your health care provider or a person from your [place] will show you how to use your oxygen device. Follow his or her instructions. The instructions may look something like this: Wash your hands with soap and water. If you use an oxygen concentrator, make sure it is plugged in. Place one end of the tube into the port on the tank, device, or machine. Place the mask over your nose and mouth. Or, place the nasal cannula and secure it with tape if instructed. If you use a tracheostomy or transtracheal catheter, connect it to the oxygen source as directed. Make sure the liter-flow setting on  the machine is at the level prescribed by your health care provider. Turn on the machine or adjust the knob on the tank or device to the correct liter-flow setting. When you are done, turn off and unplug the machine, or turn the knob to OFF. How to clean and care for the oxygen supplies Nasal cannula Clean it with a warm, wet cloth daily or as needed. Wash it with a liquid soap once a week. Rinse it thoroughly once or twice a  week. Air-dry it. Replace it every 2-4 weeks. If you have an infection, such as a cold or pneumonia, change the cannula when you get better. Mask Replace it every 2-4 weeks. If you have an infection, such as a cold or pneumonia, change the mask when you get better. Humidifier bottle Wash the bottle between each refill: Wash it with soap and warm water. Rinse it thoroughly. Clean it and its top with a disinfectant cleaner. Air-dry it. Make sure it is dry before you refill it. Oxygen concentrator Clean the air filter at least twice a week according to directions from your home medical equipment and service company. Wipe down the cabinet every day. To do this: Unplug the unit. Wipe down the cabinet with a damp cloth. Dry the cabinet. Other equipment Change any extra tubing every 1-3 months. Follow instructions from your health care provider about taking care of any other equipment. Safety tips Fire safety tips  Keep your oxygen and oxygen supplies at least 6 ft (2 m) away from sources of heat, flames, and sparks at all times. Do not allow smoking near your oxygen. Put up "no smoking" signs in your home. Avoid smoking areas when in public. Do not use materials that can burn (are flammable) while you use oxygen. This includes: Petroleum jelly. Hair spray or other aerosol sprays. Rubbing alcohol. Hand sanitizer. When you go to a restaurant with portable oxygen, ask to be seated in the non-smoking section. Keep a Data processing manager close by. Let your fire department know that you have oxygen in your home. Test your home smoke detectors regularly. Traveling Secure your oxygen tank in the vehicle so that it does not move around. Follow instructions from your medical device company about how to safely secure your tank. Make sure you have enough oxygen for the amount of time you will be away from home. If you are planning to travel by public transportation (airplane, train, bus, or boat),  contact the company to find out if it allows the use of an approved portable oxygen concentrator. You may also need documents from your health care provider and medical device company before you travel. General safety tips If you use an oxygen cylinder, make sure it is in a stand or secured to an object that will not move (fixed object). If you use liquid oxygen, make sure its container is kept upright at all times. If you use an oxygen concentrator: Tell Loss adjuster, chartered company. Make sure you are given priority service in the event that your power goes out. Avoid using extension cords if possible. Follow these instructions at home: Use oxygen only as told by your health care provider. Do not use alcohol or other drugs that make you relax (sedating drugs) unless instructed. They can slow down your breathing rate and make it hard to get in enough oxygen. Know how and when to order a refill of oxygen. Always keep a spare tank of oxygen. Plan ahead for holidays when you may not be able  to get a prescription filled. Use water-based lubricants on your lips or nostrils. Do not use oil-based products like petroleum jelly. To prevent skin irritation on your cheeks or behind your ears, tuck some gauze under the tubing. Where to find more information American Lung Association: DiabeticMale.de Contact a health care provider if: You get headaches often. You have a lasting cough. You are restless or have anxiety. You develop an illness that affects your breathing. You cannot exercise at your regular level. You have a fever. You have persistent redness under your nose. Get help right away if: You are confused. You are sleepy all the time. You have blue lips or fingernails. You have difficult or irregular breathing that is getting worse. You are struggling to breathe. These symptoms may represent a serious problem that is an emergency. Do not wait to see if the symptoms will go away. Get medical  help right away. Call your local emergency services (911 in the [place] Do not drive yourself to the hospital. Summary Your health care provider or a person from your [place] will show you how to use your oxygen device. Follow his or her instructions. If you use an oxygen concentrator, make sure it is plugged in. Make sure the liter-flow setting on the machine is at the level prescribed by your health care provider. Use oxygen only as told by your health care provider. Keep your oxygen and oxygen supplies at least 6 ft (2 m) away from sources of heat, flames, and sparks at all times. This information is not intended to replace advice given to you by your health care provider. Make sure you discuss any questions you have with your health care provider. Document Revised: 05/22/2019 Document Reviewed: 03/18/2019 Elsevier Patient Education  2022 [employer].

## 2021-03-22 NOTE — Progress Notes (Addendum)
@Patient  ID: [name], male    DOB: 11/07/1945, 75 y.o.   MRN: [id number]  Chief Complaint  Patient presents with   Follow-up    Patient says oxygen has been dropping.     Referring provider: Janith Lima, MD  HPI: 75 year old male, former smoker quit 1976.  Past medical history significant for hypertension, diastolic congestive heart failure, recurrent pulmonary embolism, arthrosclerosis of aorta, OSA, vocal cord paralysis, GERD, dysphagia, type 2 diabetes. Patient of Dr. Annamaria Boots.   03/22/2021 Patient presents today for acute visit d/t dyspnea. Patient reports increased shortness of breath with exertion over the last several months. Simple tasks cause him to get out of breath. His O2 at home dropped to 75%, it takes him 5-10 mins to recover 90%. He has no new associated respiratory symptoms. He follows with Dr. Debara Pickett who doubled his regular torsemide dose of 60mg  for short period of time. BMET and CBC were normal earlier this month. EKG showed normal sinus rhythm, HR 79. He is compliant with Eliquis 5mg  twice daily. He feels his dyspnea symptoms are mostly weight related, he has an upcoming apt with [facility]. He has chronic LE edema. Denies f/c/s, chest discomfort, chest tightness, wheezing or cough,    Allergies  Allergen Reactions   Morphine Nausea And Vomiting and Other (See Comments)    Severe hallucinations Other reaction(s): Hallucinations   Penicillins Other (See Comments)    Passed out after injection Did it involve swelling of the face/tongue/throat, SOB, or low BP? Unknown Did it involve sudden or severe rash/hives, skin peeling, or any reaction on the inside of your mouth or nose? No Did you need to seek medical attention at a hospital or doctor's office? Yes When did it last happen?    teenager   If all above answers are NO, may proceed with cephalosporin use. Other reaction(s): Dizziness, Unknown   Darvon [Propoxyphene]     Other  reaction(s): Unknown   Morphine Sulfate     Other reaction(s): Unknown   Codeine Nausea Only    Other reaction(s): Unknown   Propoxyphene N-Acetaminophen Nausea Only    Immunization History  Administered Date(s) Administered   Fluad Quad(high Dose 65+) 12/11/2018   Influenza, High Dose Seasonal PF 01/20/2016, 01/31/2017, 03/06/2018   Influenza,inj,Quad PF,6+ Mos 04/09/2013   Influenza-Unspecified 03/08/2018, 03/02/2020   PFIZER(Purple Top)SARS-COV-2 Vaccination 05/10/2019, 06/04/2019, 03/02/2020   Pneumococcal Conjugate-13 10/20/2015   Pneumococcal Polysaccharide-23 04/09/2013, 10/27/2019   Td 11/02/2009   Tdap 01/20/2016    Past Medical History:  Diagnosis Date   Anemia 07/29/2013   Asthma    childhood asthma   Benign essential HTN    Benign prostatic hypertrophy    several prostate biopsies neg for cancer.    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   FH: colonic polyps 2010   Gout    History of kidney stones    Hypertension    Kidney cysts    Morbid obesity (HCC)    Numbness    feet / legs   PAF (paroxysmal atrial fibrillation) with RVR 07/29/2013   Pericarditis 2015   s/p window   Recurrent pulmonary  emboli ([place]) 06/13/2015   Sleep apnea    USES C PAP   Stroke syndrome    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     Docusate Sodium (DSS) 100 MG CAPS Take 100 mg by mouth every other day.     ELIQUIS 5 MG TABS tablet TAKE 1 TABLET(5 MG) BY MOUTH TWICE DAILY 180 tablet 0   finasteride (PROSCAR) 5 MG tablet Take 1 tablet (5 mg total) by mouth daily. 30 tablet 1   flecainide (TAMBOCOR) 50 MG tablet Take 1 tablet (50 mg total) by mouth every 12 (twelve) hours. 180 tablet 3   loratadine (CLARITIN) 10 MG tablet Take 10 mg by mouth daily.     LORazepam (ATIVAN) 0.5 MG tablet Take 1 tablet (0.5 mg total) by mouth at bedtime. 90 tablet 1   methylPREDNISolone (MEDROL DOSEPAK) 4 MG TBPK tablet See admin instructions. follow package directions     montelukast (SINGULAIR) 10 MG tablet TAKE 1 TABLET(10 MG) BY MOUTH AT BEDTIME 90 tablet 1   Multiple Vitamin (MULTIVITAMIN WITH MINERALS) TABS tablet Take 1 tablet by mouth daily.     ofloxacin (OCUFLOX) 0.3 % ophthalmic solution Place 1 drop into the right eye 4 (four) times daily. 5 mL 0   pantoprazole (PROTONIX) 40 MG tablet TAKE 1 TABLET(40 MG) BY MOUTH DAILY 90 tablet 1   potassium chloride SA (KLOR-CON) 20 MEQ tablet TAKE 1 TABLET BY MOUTH TWICE DAILY 180 tablet 3   rOPINIRole (REQUIP) 0.5 MG tablet Take 1 tablet (0.5 mg total) by mouth 3 (three) times daily. 270 tablet 2   Semaglutide (RYBELSUS) 3 MG TABS Take 3 mg by mouth daily. 30 tablet 0   sertraline (ZOLOFT) 100 MG tablet Take 1.5 tablets (150 mg total) by mouth at bedtime. 135 tablet 1   torsemide (DEMADEX) 20 MG tablet TAKE 3 TABLETS BY MOUTH ONCE DAILY. 270 tablet 3   traMADol (ULTRAM) 50 MG tablet TAKE 1 TABLET(50 MG) BY MOUTH EVERY 8 HOURS AS NEEDED 90 tablet 5   No facility-administered medications prior to visit.    Review of Systems  Review of Systems  Constitutional: Negative.   HENT: Negative.    Respiratory:  Positive for shortness of breath.  Negative for cough, chest tightness and wheezing.   Cardiovascular:  Positive for leg swelling.    Physical Exam  BP (!) 142/64 (BP Location: Left Arm, Patient Position: Sitting, Cuff Size: Large)    Pulse 76    Temp 97.7 F (36.5 C) (Oral)    Ht 6' (1.829 m)    Wt (!) 380 lb 12.8 oz (172.7 kg)    SpO2 91%    BMI 51.65 kg/m  Physical Exam Constitutional:      General: He is not in acute distress.    Appearance: Normal appearance. He is obese. He is not ill-appearing, toxic-appearing or diaphoretic.     Comments: Appears well  HENT:     Head: Normocephalic  and atraumatic.     Mouth/Throat:     Mouth: Mucous membranes are moist.     Pharynx: Oropharynx is clear.  Cardiovascular:     Rate and Rhythm: Normal rate and regular rhythm.  Pulmonary:     Effort: Pulmonary effort is normal.     Breath sounds: Normal breath sounds. No wheezing, rhonchi or rales.     Comments: No respiratory distress; O2 mid 90s at rest on room air  Musculoskeletal:        General: Normal range of motion.     Comments: Slow gait   Skin:    General: Skin is warm and dry.     Comments: Cellulitis right lower leg   Neurological:     General: No focal deficit present.     Mental Status: He is alert and oriented to person, place, and time. Mental status is at baseline.  Psychiatric:        Mood and Affect: Mood normal.        Behavior: Behavior normal.        Thought Content: Thought content normal.        Judgment: Judgment normal.     Lab Results:  CBC    Component Value Date/Time   WBC 9.3 03/17/2021 1218   WBC 9.9 12/30/2020 1528   RBC 4.82 03/17/2021 1218   RBC 4.61 12/30/2020 1528   HGB 14.0 03/17/2021 1218   HCT 42.8 03/17/2021 1218   PLT 268 03/17/2021 1218   MCV 89 03/17/2021 1218   MCH 29.0 03/17/2021 1218   MCH 29.9 11/02/2020 1936   MCHC 32.7 03/17/2021 1218   MCHC 33.0 12/30/2020 1528   RDW 12.9 03/17/2021 1218   LYMPHSABS 1.0 12/30/2020 1528   MONOABS 0.9 12/30/2020 1528    EOSABS 0.1 12/30/2020 1528   BASOSABS 0.1 12/30/2020 1528    BMET    Component Value Date/Time   NA 147 (H) 03/17/2021 1218   K 3.6 03/17/2021 1218   CL 98 03/17/2021 1218   CO2 30 (H) 03/17/2021 1218   GLUCOSE 133 (H) 03/17/2021 1218   GLUCOSE 154 (H) 11/09/2020 1502   BUN 23 03/17/2021 1218   CREATININE 1.18 03/17/2021 1218   CREATININE 1.18 10/27/2019 1400   CALCIUM 8.9 03/17/2021 1218   GFRNONAA >60 11/02/2020 1936   GFRNONAA 61 10/27/2019 1400   GFRAA 71 10/27/2019 1400    BNP    Component Value Date/Time   BNP 88.3 06/14/2015 0345    ProBNP    Component Value Date/Time   PROBNP 107 02/20/2017 0946   PROBNP 185.0 (H) 01/26/2017 1638    Imaging: DG Chest 2 View  Result Date: 03/22/2021 CLINICAL DATA:  Congestive heart failure and shortness of breath EXAM: CHEST - 2 VIEW COMPARISON:  Multiple exams, including 12/30/2020 and CT chest from 01/25/2021 FINDINGS: Interstitial accentuation favoring the lung bases, right greater than left, with associated airway thickening but no cardiomegaly. No cephalization of blood flow. No blunting of the costophrenic angles. Thoracic spondylosis noted. IMPRESSION: 1. Interstitial accentuation in the lung bases, right greater than left. Possibilities include atypical pneumonia, aspiration pneumonitis, or atelectasis associated with the mild bilateral airway thickening. No cardiomegaly to indicate cardiogenic edema. Electronically Signed   By: Van Clines M.D.   On: 03/22/2021 12:42     Assessment & Plan:   Dyspnea on exertion - Patient reports increased shortness of breath with exertion for the last several months. New oxygen requirements. No associated respiratory symptoms or chest  discomfort.  Heart failure, obesity and deconditioning likely playing a role but need to rule out other causes such as PNA, pneumothorax, pulmonary embolism, pleural effusion and acute exacerbation of heart failure. Checking CXR and labs today  including D-dimer and BNP.   Chronic respiratory failure with hypoxia (HCC) - New; O2 at home dropped to 75%, it takes him 5-10 mins to recover  - Patient needs 2L supplemental oxygen with moderate exertion to maintain O2 >90%  - Checking ONO on BIPAP to assess nocturnal oxygen need - Dme order placed as high priority   Diastolic congestive heart failure (HCC) - Continue Torsemide 60mg  daily per cardiology - Checking BNP d/t new oxygen requirements   Recurrent pulmonary emboli (HCC) - Continue Eliquis 5mg  twice daily per cardiology  - Checking D-dimer    Martyn Ehrich, NP 03/22/2021

## 2021-03-22 NOTE — Addendum Note (Signed)
Addended by: Martyn Ehrich on: 03/22/2021 02:03 PM   Modules accepted: Orders

## 2021-03-22 NOTE — Progress Notes (Signed)
CXR showed some increased markings at lung bases that may represents pneumonia/pneumonitis. I will send in abx and prednisone course. Needs follow-up in 4 weeks with Dr. Annamaria Boots or [facility]

## 2021-03-22 NOTE — Assessment & Plan Note (Addendum)
-   Patient reports increased shortness of breath with exertion for the last several months. New oxygen requirements. No associated respiratory symptoms or chest discomfort.  Heart failure, obesity and deconditioning likely playing a role but need to rule out other causes such as PNA, pneumothorax, pulmonary embolism, pleural effusion and acute exacerbation of heart failure. Checking CXR and labs today including D-dimer and BNP.

## 2021-03-22 NOTE — Telephone Encounter (Signed)
Martyn Ehrich, NP  03/22/2021  1:58 PM EST     CXR showed some increased markings at lung bases that may represents pneumonia/pneumonitis. I will send in abx and prednisone course. Needs follow-up in 4 weeks with Dr. Annamaria Boots or Maryclare Bean, NP  03/22/2021  2:26 PM EST Back to Top    I sent you results note on this patient's CXR which showed possible pna, sent in abx. D-dimer was elevated, needs stat CTA     Attempted to call pt but unable to reach and unable to leave VM. Saw that pt was scheduled to have the CTa at 5pm today. Will leave encounter open to try to reach pt later.

## 2021-03-22 NOTE — Assessment & Plan Note (Signed)
-   Continue Torsemide 60mg  daily per cardiology - Checking BNP d/t new oxygen requirements

## 2021-03-23 ENCOUNTER — Telehealth: Payer: Self-pay | Admitting: Primary Care

## 2021-03-23 DIAGNOSIS — L03115 Cellulitis of right lower limb: Secondary | ICD-10-CM | POA: Insufficient documentation

## 2021-03-23 NOTE — Telephone Encounter (Signed)
Called and spoke with pt letting him know that since we did not have an actual walk with all the correct documentation that [employer] was not able to get O2 to him. Stated to him that we will need to schedule a qualifying walk so we could get this taken care of for him and he verbalized understanding. Walk has been scheduled for pt Friday so pt can have an extra day to have abx and prednisone in system before coming in for the walk. Nothing further needed.

## 2021-03-23 NOTE — Progress Notes (Signed)
Please let patient know CTA was negative for PE. Lungs showed mild atelectatic changes right base, no focal pneumonia. CXR suggested there was a pneumonia but CTA is a better imaged and showed no evidence of this.  He can still take abx as this may help his cellulitis on his right leg.   Incidental findings stable heterogeneity thyroid dating back to 2018 consistent with a benign etiology. He also had coronary calcifications within the heart, should follow-up with cardiology.

## 2021-03-23 NOTE — Assessment & Plan Note (Addendum)
-   Open wound right shin with surround erythema - Encourage sterile dressing changes, leg elevation and compression - RX doxycycline

## 2021-03-23 NOTE — Telephone Encounter (Signed)
Per Raquel Sarna- this was addressed in previous encounter and the pt coming in for qualifying walk 03/25/21.

## 2021-03-23 NOTE — Telephone Encounter (Signed)
I called and spoke with the pt's spouse, [name]. Pt already had his CTA done 03/22/21. I went over the results of cxr and advised that [facility] already sent pred and abx . I have him scheduled to come back and see Dr Annamaria Boots on 04/26/21. [name] concerned about CTA results- advised her that report states neg for PE. Beth, can you please advise further on the CTA results? Thanks!

## 2021-03-23 NOTE — Telephone Encounter (Signed)
Sent results note to triage

## 2021-03-23 NOTE — Telephone Encounter (Signed)
Please let patient know CTA was negative for PE. Lungs showed mild atelectatic changes right base, no focal pneumonia. CXR suggested there was a pneumonia but CTA is a better imaged and showed no evidence of this.  He can still take abx as this may help his cellulitis on his right leg.   Incidental findings stable heterogeneity thyroid dating back to 2018 consistent with a benign etiology. He also had coronary calcifications within the heart, should follow-up with cardiology   Spoke with pt and notified of results per [facility]. Pt verbalized understanding and denied any questions.

## 2021-03-25 ENCOUNTER — Other Ambulatory Visit: Payer: Self-pay | Admitting: Internal Medicine

## 2021-03-25 ENCOUNTER — Ambulatory Visit (INDEPENDENT_AMBULATORY_CARE_PROVIDER_SITE_OTHER): Payer: Medicare Other

## 2021-03-25 ENCOUNTER — Other Ambulatory Visit: Payer: Self-pay

## 2021-03-25 DIAGNOSIS — R0602 Shortness of breath: Secondary | ICD-10-CM | POA: Diagnosis not present

## 2021-03-25 DIAGNOSIS — J9611 Chronic respiratory failure with hypoxia: Secondary | ICD-10-CM

## 2021-03-25 DIAGNOSIS — K2100 Gastro-esophageal reflux disease with esophagitis, without bleeding: Secondary | ICD-10-CM

## 2021-03-25 NOTE — Progress Notes (Signed)
Pt was walked in the office for a qualifying walk for O2. Pt did drop to 87% on room air and was titrated for the amount of O2 he needed.  Pt needed 2L continuous to keep saturations stable.  Urgent O2 order has been placed. Pt will be sent home on a TOC until he is able to receive the O2 from [employer]. [employer] has already been notified that the walk has been done. Routing to [employer].

## 2021-03-29 ENCOUNTER — Telehealth: Payer: Self-pay | Admitting: Primary Care

## 2021-03-29 NOTE — Telephone Encounter (Signed)
Noted. She states that pt was contacted on 12/23 about his oxygen and that he wanted to reschedule delivery to first of next week.  Called and left message for [name] to let us know if she needs anything else.

## 2021-04-05 ENCOUNTER — Telehealth: Payer: Self-pay | Admitting: Internal Medicine

## 2021-04-05 ENCOUNTER — Encounter (HOSPITAL_COMMUNITY): Payer: Self-pay

## 2021-04-05 ENCOUNTER — Ambulatory Visit (HOSPITAL_COMMUNITY): Payer: Medicare Other | Attending: Cardiovascular Disease

## 2021-04-05 NOTE — Telephone Encounter (Signed)
April 05, 2021 Me      2:52 PM Note Called and spoke with patient who states that we sent him home with oxygen the other day and he is currently waiting for them to set it up in his home. He states that he asked [employer] to push the set up date back a week because him and his wife were exposed to covid and they didn't want anyone in the house. He states that they are supposed to come on Friday and set him up. Patient is asking about a POC option and states that he can't seem to charge the [facility] that we sent him home with. Called and spoke with [name] at [employer] to see if there was a way to bring patient into their office and evaluate him for a POC before Friday or do a best fit eval on him and advised her that he is willing to come in. She said yes and that she would have the team reach out to him to get him in. Called patient back to let him know of above information and he expressed understanding. Also advised him to keep the oxygen on as much as he can and to do as little as possible between now and Friday when they have him set up due to the [facility] not keeping charge. Also told him that if his oxygen stays lower then 88% with the oxygen on for more then a minute he will need to go to the ED for evaluation. He expressed understanding. Nothing further needed at this time.

## 2021-04-05 NOTE — Telephone Encounter (Signed)
Called and spoke with patient who states that we sent him home with oxygen the other day and he is currently waiting for them to set it up in his home. He states that he asked [employer] to push the set up date back a week because him and his wife were exposed to covid and they didn't want anyone in the house. He states that they are supposed to come on Friday and set him up. Patient is asking about a POC option and states that he can't seem to charge the [facility] that we sent him home with. Called and spoke with [name] at [employer] to see if there was a way to bring patient into their office and evaluate him for a POC before Friday or do a best fit eval on him and advised her that he is willing to come in. She said yes and that she would have the team reach out to him to get him in. Called patient back to let him know of above information and he expressed understanding. Also advised him to keep the oxygen on as much as he can and to do as little as possible between now and Friday when they have him set up due to the [facility] not keeping charge. Also told him that if his oxygen stays lower then 88% with the oxygen on for more then a minute he will need to go to the ED for evaluation. He expressed understanding. Nothing further needed at this time.

## 2021-04-05 NOTE — Progress Notes (Signed)
Verified appointment "no show" status with D.Price at 09:28.

## 2021-04-12 ENCOUNTER — Other Ambulatory Visit: Payer: Self-pay

## 2021-04-12 ENCOUNTER — Ambulatory Visit (HOSPITAL_COMMUNITY): Payer: Medicare Other | Attending: Cardiology

## 2021-04-12 DIAGNOSIS — I5032 Chronic diastolic (congestive) heart failure: Secondary | ICD-10-CM | POA: Diagnosis not present

## 2021-04-12 LAB — ECHOCARDIOGRAM COMPLETE
Area-P 1/2: 5.13 cm2
S' Lateral: 3.7 cm

## 2021-04-14 ENCOUNTER — Encounter: Payer: Self-pay | Admitting: Internal Medicine

## 2021-04-15 ENCOUNTER — Telehealth: Payer: Self-pay

## 2021-04-15 NOTE — Progress Notes (Signed)
Chronic Care Management Pharmacy Assistant   Name: [name]  MRN: [id number] DOB: 1946-01-02   Reason for Encounter: Disease State  Appt: 04/18/21 @ 1:45pm with Linna Hoff  Conditions to be addressed/monitored: General Call   Recent office visits:  None ID  Recent consult visits:  03/22/21 Martyn Ehrich, NP-Pulmonary Disease ([facility]) Orders:New Oxygen start - 2L with exertion and at bedtime, ONO on BIPAP, CXR and Labs today, no med changes  03/15/21 Deberah Pelton, NP-Cardiology ([facility]) Repeat BMP, Repeat echocardiogram  Hospital visits:  Not since last coordination call   Medications: Outpatient Encounter Medications as of 04/15/2021  Medication Sig   atorvastatin (LIPITOR) 80 MG tablet TAKE 1 TABLET(80 MG) BY MOUTH DAILY   [facility] MELATONIN GUMMIES PO Take by mouth.   Docusate Sodium (DSS) 100 MG CAPS Take 100 mg by mouth every other day.   doxycycline (VIBRA-TABS) 100 MG tablet Take 1 tablet (100 mg total) by mouth 2 (two) times daily.   ELIQUIS 5 MG TABS tablet TAKE 1 TABLET(5 MG) BY MOUTH TWICE DAILY   finasteride (PROSCAR) 5 MG tablet Take 1 tablet (5 mg total) by mouth daily.   flecainide (TAMBOCOR) 50 MG tablet Take 1 tablet (50 mg total) by mouth every 12 (twelve) hours.   loratadine (CLARITIN) 10 MG tablet Take 10 mg by mouth daily.   LORazepam (ATIVAN) 0.5 MG tablet Take 1 tablet (0.5 mg total) by mouth at bedtime.   methylPREDNISolone (MEDROL DOSEPAK) 4 MG TBPK tablet See admin instructions. follow package directions   montelukast (SINGULAIR) 10 MG tablet TAKE 1 TABLET(10 MG) BY MOUTH AT BEDTIME   Multiple Vitamin (MULTIVITAMIN WITH MINERALS) TABS tablet Take 1 tablet by mouth daily.   ofloxacin (OCUFLOX) 0.3 % ophthalmic solution Place 1 drop into the right eye 4 (four) times daily.   pantoprazole (PROTONIX) 40 MG tablet TAKE 1 TABLET(40 MG) BY MOUTH DAILY   potassium chloride SA (KLOR-CON M) 20 MEQ tablet TAKE 1 TABLET BY MOUTH TWICE DAILY   predniSONE  (DELTASONE) 10 MG tablet 4 tabs for 2 days, then 3 tabs for 2 days, 2 tabs for 2 days, then 1 tab for 2 days, then stop   rOPINIRole (REQUIP) 0.5 MG tablet Take 1 tablet (0.5 mg total) by mouth 3 (three) times daily.   Semaglutide (RYBELSUS) 3 MG TABS Take 3 mg by mouth daily.   sertraline (ZOLOFT) 100 MG tablet Take 1.5 tablets (150 mg total) by mouth at bedtime.   torsemide (DEMADEX) 20 MG tablet TAKE 3 TABLETS BY MOUTH ONCE DAILY.   traMADol (ULTRAM) 50 MG tablet TAKE 1 TABLET(50 MG) BY MOUTH EVERY 8 HOURS AS NEEDED   No facility-administered encounter medications on file as of 04/15/2021.   Reviewed chart prior to disease state call. Spoke with patient regarding BP  Recent Office Vitals: BP Readings from Last 3 Encounters:  03/22/21 (!) 142/64  03/15/21 130/62  12/30/20 128/64   Pulse Readings from Last 3 Encounters:  03/22/21 76  03/15/21 79  12/30/20 85    Wt Readings from Last 3 Encounters:  03/22/21 (!) 380 lb 12.8 oz (172.7 kg)  03/15/21 (!) 381 lb (172.8 kg)  12/30/20 (!) 388 lb (176 kg)     Kidney Function Lab Results  Component Value Date/Time   CREATININE 1.18 03/17/2021 12:18 PM   CREATININE 1.26 11/09/2020 03:02 PM   CREATININE 1.18 10/27/2019 02:00 PM   CREATININE 1.12 12/05/2012 02:06 PM   GFR 56.04 (L) 11/09/2020 03:02 PM  GFRNONAA >60 11/02/2020 07:36 PM   GFRNONAA 61 10/27/2019 02:00 PM   GFRAA 71 10/27/2019 02:00 PM    BMP Latest Ref Rng & Units 03/17/2021 11/09/2020 11/02/2020  Glucose 70 - 99 mg/dL 133(H) 154(H) 155(H)  BUN 8 - 27 mg/dL 23 36(H) 21  Creatinine 0.76 - 1.27 mg/dL 1.18 1.26 1.23  BUN/Creat Ratio 10 - 24 19 - -  Sodium 134 - 144 mmol/L 147(H) 145 140  Potassium 3.5 - 5.2 mmol/L 3.6 3.5 2.8(L)  Chloride 96 - 106 mmol/L 98 99 101  CO2 20 - 29 mmol/L 30(H) 34(H) 27  Calcium 8.6 - 10.2 mg/dL 8.9 8.9 8.2(L)    Have you had any problems recently with your health?Patient states that he is on oxygen 2L at night. Patient believes that it is  not enough because he still is fatigued and sob with the oxygen.   Have you had any problems with your pharmacy?Patient states that the only problems that he is having is insurance will not pay for Eliquis  What issues or side effects are you having with your medications?Patient states that he is not having any side effects from medications that he knows off  What would you like me to pass along to [facility] for them to help you with? Patient states that he is doing ok if he could get his breathing right  What can we do to take care of you better? Patient states that he does not need anything at this time  Adherence Review: Is the patient currently on ACE/ARB medication? No Does the patient have >5 day gap between last estimated fill dates? No    Care Gaps: Colonoscopy-07/31/13 Diabetic Foot Exam-11/09/20 Ophthalmology-01/05/21 Dexa Scan - NA Annual Well Visit - NA Micro albumin-11/09/20 Hemoglobin A1c- 11/09/20  Star Rating Drugs: Atorvastatin 80 mg-last fill 02/18/21 90 ds Rybelsus 3 mg-last fill 02/07/21  Ethelene Hal Clinical Pharmacist Assistant [phone]

## 2021-04-18 ENCOUNTER — Ambulatory Visit (INDEPENDENT_AMBULATORY_CARE_PROVIDER_SITE_OTHER): Payer: Medicare Other

## 2021-04-18 DIAGNOSIS — I480 Paroxysmal atrial fibrillation: Secondary | ICD-10-CM

## 2021-04-18 DIAGNOSIS — E785 Hyperlipidemia, unspecified: Secondary | ICD-10-CM

## 2021-04-18 DIAGNOSIS — I10 Essential (primary) hypertension: Secondary | ICD-10-CM

## 2021-04-18 DIAGNOSIS — E118 Type 2 diabetes mellitus with unspecified complications: Secondary | ICD-10-CM

## 2021-04-18 NOTE — Patient Instructions (Signed)
Visit Information  Following are the goals we discussed today:   Manage My Medicine   Timeframe:  Long-Range Goal Priority:  Medium Start Date:   09/21/20                          Expected End Date:     04/23/22                  Follow Up Date April 2023   - call for medicine refill 2 or 3 days before it runs out - call if I am sick and can't take my medicine - keep a list of all the medicines I take; vitamins and herbals too - use a pillbox to sort medicine  -contact CCM pharmacist when out of pocket costs total > $1500   Why is this important?   These steps will help you keep on track with your medicines.  Plan: Telephone follow up appointment with care management team member scheduled for:  3 months  The patient has been provided with contact information for the care management team and has been advised to call with any health related questions or concerns.   Tomasa Blase, PharmD Clinical Pharmacist, [facility]   Please call the care guide team at [phone] if you need to cancel or reschedule your appointment.   Patient verbalizes understanding of instructions and care plan provided today and agrees to view in [place]. Active MyChart status confirmed with patient.

## 2021-04-18 NOTE — Progress Notes (Signed)
Chronic Care Management Pharmacy Note  04/18/2021 Name:  [name] MRN:  [id number] DOB:  June 20, 1945  Summary: -Patient reports that he did not start rybelsus due to cost -Wanted to complete eliquis PAP for 2023 - wondering about application requirements   Recommendations/Changes made from today's visit: -PAP applications for rybelsus and eliquis sent to patient, explained to patient that eliquis PAP requires 3% OOP pharmacy expenses for total annual income be met before acceptance into program, patient voiced understanding  -Agreeable to start rybelsus if accepted into PAP program   Subjective: [name] is an 76 y.o. year old male who is a primary patient of Janith Lima, MD.  The CCM team was consulted for assistance with disease management and care coordination needs.    Engaged with patient by telephone for follow up visit in response to provider referral for pharmacy case management and/or care coordination services.   Consent to Services:  The patient was given information about Chronic Care Management services, agreed to services, and gave verbal consent prior to initiation of services.  Please see initial visit note for detailed documentation.   Patient Care Team: Janith Lima, MD as PCP - General (Internal Medicine) Debara Pickett [name], MD as PCP - Cardiology (Cardiology) Himmelrich, Bryson Ha, RD (Inactive) as Dietitian Tia Masker) Leta Baptist, MD as Consulting Physician (Otolaryngology) Kathlee Nations, MD as Consulting Physician (Psychiatry) Debara Pickett [name], MD as Consulting Physician (Cardiology) Chyrel Masson, DO (Otolaryngology) Deneise Lever, MD as Consulting Physician (Pulmonary Disease) Opthamology, [facility] as Consulting Physician (Ophthalmology) Tomasa Blase, [facility] (Pharmacist)  Recent office visits: 12/30/2020 - Dr. Ronnald Ramp- bruising around left lower rib cage - no changes to medications   Recent consult visits: 03/22/2021 - Geraldo Pitter NP - Pulmonology - dyspnea / increased SOB with exertion over the last several months - doxycycline for cellulitis, prednisone taper to cover possible PNA / aspiration pneumonitis as well 03/15/2021 - Coletta Memos NP - Cardiology - increased DOE - referral to wound care for right shin ulcer - to make earlier appointment with pulmonology - trazodone stopped  01/28/2021 - Dr. Redmond Pulling [facility] - evaluation of weight gain - referred to weight management clinic - possible start of GLP1  12/07/2020 - Dr. Letta Pate - Physical Medicine and Rehabilitation - no changes to medications - follow up in 6 months   Hospital visits: 11/02/2020 - ED visit due to bell's palsy / acute bacterial conjunctivitis of right eye - prescribed prednisone taper, ofloxacin 0.3% eye drops, and valacyclovir    Objective:  Lab Results  Component Value Date   CREATININE 1.18 03/17/2021   BUN 23 03/17/2021   GFR 56.04 (L) 11/09/2020   GFRNONAA >60 11/02/2020   GFRAA 71 10/27/2019   NA 147 (H) 03/17/2021   K 3.6 03/17/2021   CALCIUM 8.9 03/17/2021   CO2 30 (H) 03/17/2021   GLUCOSE 133 (H) 03/17/2021    Lab Results  Component Value Date/Time   HGBA1C 6.5 11/09/2020 03:02 PM   HGBA1C 6.5 05/12/2020 02:32 PM   GFR 56.04 (L) 11/09/2020 03:02 PM   GFR 59.03 (L) 05/12/2020 02:32 PM   MICROALBUR <0.7 11/09/2020 03:02 PM   MICROALBUR 7.1 10/27/2019 02:00 PM    Last diabetic Eye exam:  Lab Results  Component Value Date/Time   HMDIABEYEEXA No Retinopathy 01/05/2021 12:00 AM    Last diabetic Foot exam: No results found for: HMDIABFOOTEX   Lab Results  Component Value Date   CHOL  149 11/09/2020   HDL 44.20 11/09/2020   LDLCALC 77 11/09/2020   LDLDIRECT 91.0 05/16/2018   TRIG 139.0 11/09/2020   CHOLHDL 3 11/09/2020    Hepatic Function Latest Ref Rng & Units 11/02/2020 10/27/2019 05/16/2018  Total Protein 6.5 - 8.1 g/dL 6.2(L) 6.1 6.9  Albumin 3.5 - 5.0 g/dL 3.1(L) - 3.6  AST 15 - 41 U/L _0 ALT 0 - 44 U/L _1 Alk Phosphatase 38 - 126 U/L 103 - 111  Total Bilirubin 0.3 - 1.2 mg/dL 0.7 0.9 0.8  Bilirubin, Direct 0.0 - 0.2 mg/dL - 0.2 -    Lab Results  Component Value Date/Time   TSH 2.43 11/09/2020 03:02 PM   TSH 4.33 10/27/2019 02:00 PM    CBC Latest Ref Rng & Units 03/17/2021 12/30/2020 11/02/2020  WBC 3.4 - 10.8 x10E3/uL 9.3 9.9 8.8  Hemoglobin 13.0 - 17.7 g/dL 14.0 13.7 14.2  Hematocrit 37.5 - 51.0 % 42.8 41.4 44.1  Platelets 150 - 450 x10E3/uL 268 242.0 236   No results found for: VD25OH  Clinical ASCVD: No  The ASCVD Risk score (Arnett DK, et al., 2019) failed to calculate for the following reasons:   The patient has a prior MI or stroke diagnosis    Depression screen [facility] 2/9 12/16/2020 12/07/2020 11/09/2020  Decreased Interest 0 1 0  Down, Depressed, Hopeless 0 1 0  PHQ - 2 Score 0 2 0  Altered sleeping - - 0  Tired, decreased energy - - 0  Change in appetite - - 0  Feeling bad or failure about yourself  - - 0  Trouble concentrating - - 0  Moving slowly or fidgety/restless - - 0  Suicidal thoughts - - 0  PHQ-9 Score - - 0  Difficult doing work/chores - - -  Some recent data might be hidden     Social History   Tobacco Use  Smoking Status Former   Packs/day: 0.20   Years: 13.00   Pack years: 2.60   Types: Cigarettes   Start date: 04/03/1961   Quit date: 08/30/1974   Years since quitting: 46.6  Smokeless Tobacco Never   BP Readings from Last 3 Encounters:  03/22/21 (!) 142/64  03/15/21 130/62  12/30/20 128/64   Pulse Readings from Last 3 Encounters:  03/22/21 76  03/15/21 79  12/30/20 85   Wt Readings from Last 3 Encounters:  03/22/21 (!) 380 lb 12.8 oz (172.7 kg)  03/15/21 (!) 381 lb (172.8 kg)  12/30/20 (!) 388 lb (176 kg)   BMI Readings from Last 3 Encounters:  03/22/21 51.65 kg/m  03/15/21 51.67 kg/m  12/30/20 52.62 kg/m    Assessment/Interventions: Review of patient past medical history, allergies, medications, health  status, including review of consultants reports, laboratory and other test data, was performed as part of comprehensive evaluation and provision of chronic care management services.   SDOH:  (Social Determinants of Health) assessments and interventions performed: Yes  SDOH Screenings   Alcohol Screen: Low Risk    Last Alcohol Screening Score (AUDIT): 0  Depression (PHQ2-9): Low Risk    PHQ-2 Score: 0  Financial Resource Strain: Low Risk    Difficulty of Paying Living Expenses: Not hard at all  Food Insecurity: No Food Insecurity   Worried About Charity fundraiser in the Last Year: Never true   Ran Out of Food in the Last Year: Never true  Housing: Low Risk    Last Housing Risk Score: 0  Physical Activity: Inactive   Days of Exercise per Week: 0 days   Minutes of Exercise per Session: 0 min  Social Connections: Socially Integrated   Frequency of Communication with Friends and Family: More than three times a week   Frequency of Social Gatherings with Friends and Family: More than three times a week   Attends Religious Services: 1 to 4 times per year   Active Member of [employer] or Organizations: Yes   Attends Archivist Meetings: 1 to 4 times per year   Marital Status: Married  Stress: No Stress Concern Present   Feeling of Stress : Not at all  Tobacco Use: Medium Risk   Smoking Tobacco Use: Former   Smokeless Tobacco Use: Never   Passive Exposure: Not on Pensions consultant Needs: No Transportation Needs   Lack of Transportation (Medical): No   Lack of Transportation (Non-Medical): No    CCM Care Plan  Allergies  Allergen Reactions   Morphine Nausea And Vomiting and Other (See Comments)    Severe hallucinations Other reaction(s): Hallucinations   Penicillins Other (See Comments)    Passed out after injection Did it involve swelling of the face/tongue/throat, SOB, or low BP? Unknown Did it involve sudden or severe rash/hives, skin peeling, or any reaction on the  inside of your mouth or nose? No Did you need to seek medical attention at a hospital or doctor's office? Yes When did it last happen?    teenager   If all above answers are NO, may proceed with cephalosporin use. Other reaction(s): Dizziness, Unknown   Darvon [Propoxyphene]     Other reaction(s): Unknown   Morphine Sulfate     Other reaction(s): Unknown   Codeine Nausea Only    Other reaction(s): Unknown   Propoxyphene N-Acetaminophen Nausea Only    Medications Reviewed Today     Reviewed by Tomasa Blase, [facility] (Pharmacist) on 04/18/21 at 1414  Med List Status: <None>   Medication Order Taking? Sig Documenting Provider Last Dose Status Informant  atorvastatin (LIPITOR) 80 MG tablet [id number] Yes TAKE 1 TABLET(80 MG) BY MOUTH DAILY Janith Lima, MD Taking Active   [facility] MELATONIN GUMMIES PO [id number] Yes Take 5 mg by mouth. [provider] Taking Active   Docusate Sodium (DSS) 100 MG CAPS [id number] Yes Take 100 mg by mouth every other day. Kelvin Cellar, MD Taking Active Multiple Informants  ELIQUIS 5 MG TABS tablet [id number] Yes TAKE 1 TABLET(5 MG) BY MOUTH TWICE DAILY Janith Lima, MD Taking Active   finasteride (PROSCAR) 5 MG tablet [id number] Yes Take 1 tablet (5 mg total) by mouth daily. Cathlyn Parsons, PA-C Taking Active Multiple Informants  flecainide (TAMBOCOR) 50 MG tablet [id number] Yes Take 1 tablet (50 mg total) by mouth every 12 (twelve) hours. Pixie Casino, MD Taking Active   loratadine (CLARITIN) 10 MG tablet [id number] Yes Take 10 mg by mouth daily. [provider] Taking Active Multiple Informants  LORazepam (ATIVAN) 0.5 MG tablet [id number] Yes Take 1 tablet (0.5 mg total) by mouth at bedtime. Kathlee Nations, MD Taking Active   montelukast (SINGULAIR) 10 MG tablet [id number] Yes TAKE 1 TABLET(10 MG) BY MOUTH AT BEDTIME Janith Lima, MD Taking Active   Multiple Vitamin (MULTIVITAMIN WITH MINERALS) TABS tablet [id number] Yes Take 1 tablet  by mouth daily. [provider] Taking Active   pantoprazole (PROTONIX) 40 MG tablet [id number] Yes TAKE 1 TABLET(40 MG) BY MOUTH DAILY Janith Lima, MD Taking Active  potassium chloride SA (KLOR-CON M) 20 MEQ tablet [id number] Yes TAKE 1 TABLET BY MOUTH TWICE DAILY [name], [name], MD Taking Active   rOPINIRole (REQUIP) 0.5 MG tablet [id number] Yes Take 1 tablet (0.5 mg total) by mouth 3 (three) times daily.  Patient taking differently: Take 1.5 mg by mouth at bedtime.   Deneise Lever, MD Taking Active   Semaglutide [facility]) 3 MG TABS [id number] No Take 3 mg by mouth daily.  Patient not taking: Reported on 04/18/2021   Janith Lima, MD Not Taking Active   sertraline (ZOLOFT) 100 MG tablet [id number] Yes Take 1.5 tablets (150 mg total) by mouth at bedtime. Kathlee Nations, MD Taking Active   torsemide (DEMADEX) 20 MG tablet [id number] Yes TAKE 3 TABLETS BY MOUTH ONCE DAILY. Pixie Casino, MD Taking Active   traMADol (ULTRAM) 50 MG tablet [id number] Yes TAKE 1 TABLET(50 MG) BY MOUTH EVERY 8 HOURS AS NEEDED Charlett Blake, MD Taking Active             Patient Active Problem List   Diagnosis Date Noted   Cellulitis of right leg 03/23/2021   Dyspnea on exertion 03/22/2021   Chronic respiratory failure with hypoxia ([place]) 03/22/2021   Flank pain, chronic 01/03/2021   Left-sided chest wall pain 12/30/2020   Spontaneous ecchymosis 12/30/2020   Atherosclerosis of aorta ([place]) 11/10/2020   Chronic hypokalemia 11/09/2020   Insomnia 09/27/2020   Type II diabetes mellitus with manifestations ([place]) 11/14/2018   B12 deficiency 05/16/2018   Lumbosacral spondylosis without myelopathy 12/18/2017   Vocal fold paralysis, 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   Vocal cord dysfunction 11/22/2016   Benign essential HTN    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   OSA (obstructive sleep apnea)    Dysphagia, post-stroke    Neurologic gait disorder    Encounter for  monitoring flecainide therapy 04/28/2016   Recurrent pulmonary emboli ([place]) 06/13/2015   GERD (gastroesophageal reflux disease) 06/13/2015   Basal cell carcinoma of skin 06/28/2014   Periodic limb movement sleep disorder 06/19/2014   PAF (paroxysmal atrial fibrillation) ([place]) 07/29/2013   Chronic anticoagulation 07/22/2013   Gastric ulcer with hemorrhage 07/20/13 12/75/1700   Diastolic congestive heart failure ([place]) 07/16/2013   H/O laparoscopic adjustable gastric banding & hiatal hernia repair 04/08/13 04/23/2013   Morbid obesity ([place]) 08/09/2012   Peripheral neuropathy ([place]) 11/02/2009   Hyperlipidemia with target LDL less than 130 09/10/2008   GOUT 06/28/2006   BPH (benign prostatic hyperplasia) 06/28/2006    Immunization History  Administered Date(s) Administered   Fluad Quad(high Dose 65+) 12/11/2018   Influenza, High Dose Seasonal PF 01/20/2016, 01/31/2017, 03/06/2018   Influenza,inj,Quad PF,6+ Mos 04/09/2013   Influenza-Unspecified 03/08/2018, 03/02/2020   PFIZER(Purple Top)SARS-COV-2 Vaccination 05/10/2019, 06/04/2019, 03/02/2020   Pneumococcal Conjugate-13 10/20/2015   Pneumococcal Polysaccharide-23 04/09/2013, 10/27/2019   Td 11/02/2009   Tdap 01/20/2016    Conditions to be addressed/monitored:  Hypertension, Hyperlipidemia, Diabetes, Atrial Fibrillation, Heart Failure, Coronary Artery Disease, Depression, Anxiety, and Back pain  Care Plan : CCM Pharmacy Care Plan  Updates made by Tomasa Blase, RPH since 04/18/2021 12:00 AM     Problem: Hypertension, Hyperlipidemia, Diabetes, Atrial Fibrillation, Heart Failure, Coronary Artery Disease, Depression, Anxiety, and Back pain   Priority: High     Long-Range Goal: Disease management   Start Date: 09/23/2020  Expected End Date: 09/23/2021  This Visit's Progress: On track  Recent Progress: On track  Priority: High  Note:   Current Barriers:  Unable  to independently monitor therapeutic efficacy  Pharmacist Clinical  Goal(s):  Patient will achieve adherence to monitoring guidelines and medication adherence to achieve therapeutic efficacy through collaboration with PharmD and provider.   Interventions: 1:1 collaboration with Janith Lima, MD regarding development and update of comprehensive plan of care as evidenced by provider attestation and co-signature Inter-disciplinary care team collaboration (see longitudinal plan of care) Comprehensive medication review performed; medication list updated in electronic medical record  AFIB  Controlled  Hx GI bleed 07/2013. Patient is currently rhythm controlled.  Patient has failed these meds in past: n/a Patient is currently controlled on the following medications: Flecainide 50 mg q12h, Eliquis 5 mg BID   We discussed: Benefits of Eliquis for stroke prevention; Bleeding risks; avoid NSAIDs; pt does not yet qualify for Eliquis PAP (OOP costs  have not been met as of this time) - application mailed to patient    Plan: Continue current medications - submit eliquis PAP once 3% OOP expenses has been met    Heart Failure / Hypertension    Type: Diastolic Last ejection fraction: 04/12/2021 -  60-65%   Patient has failed these meds in past: amlodipine, furosemide, metoprolol tartrate,   Patient is currently controlled on the following medications: Torsemide 20 mg x 3 daily - takes 4-5 days per week Potassium 20 mEq BID   We discussed: takes loop diuretic 4-5 days per week, he does not take it on days he has to go out because it causes frequent urination; pt reports SOB but unrelated to fluid per cardiologist - obesity contributing - follow closely with Dr. Annamaria Boots (pulmonology) - has follow up 04/26/2021   Plan: Continue current medications   Diabetes  Controlled  A1c goal < 7% Lab Results  Component Value Date   HGBA1C 6.5 11/09/2020  Current Medications: Rybelsus 21m daily - Patient reports he has not started due to cost    We discussed: diet and  exercise extensively;  patient prescribed rybelsus 02/2021  - was not started due to cost, reviewed with patient benefits of medication, pt agreeable to start should he be accepted into PAP program - mailed application    Plan: Continue control with diet and exercise, submit PAP application for rybelsus    Hyperlipidemia / CAD  Controlled  - last LDL near goal  LDL goal < 70 Lab Results  Component Value Date   LDLCALC 77 076/19/5093 Hx embolic stroke. Hx aortic atherosclerosis and coronary artery calcification on CT scan Patient has failed these meds in past: n/a Patient is currently controlled on the following medications: Atorvastatin 80 mg daily AM   We discussed:  diet and exercise extensively; no antiplatelet due to Eliquis;   Plan: Continue current medications and control with diet and exercise    Depression/anxiety  Patient has failed these meds in past: n/a Patient is currently controlled on the following medications: Sertraline 150 mg HS, Lorazepam 0.5 mg HS   We discussed: Pt reports good control with medications as prescribed, denies issues currently   Plan: Continue current medications   Pain  Controlled  Lumbosacral spondylosis Patient has failed these meds in past: hydrocodone/APAP Patient is currently controlled on the following medications: Tramadol 50 mg q8h prn (typically BID)   We discussed:  Can't take NSAIDs due to Eliquis, tramadol helps a lot. He does endorse significant back pain making it difficult to exercise or even stand for short periods of time -tramadol has been the only medication that has offered benefit in  control of his pain. Encouraged weight loss to help with pain as well.   Plan: Continue current medications   Patient Goals/Self-Care Activities Patient will:  - take medications as prescribed focus on medication adherence by pill box check blood pressure 2-3x per week, document, and provide at future appointments collaborate with  provider on medication access solutions target a minimum of 150 minutes of moderate intensity exercise weekly    Medication Assistance:  Eliquis and Rybelsus PAP applications mailed to patient today  Care Gaps: Shingrix Covid booster (due 05/31/20)  Patient's preferred pharmacy is:  [facility] [address], [place] [address] Phone: [phone] Fax: [phone]   Uses pill box? Yes Pt endorses 100% compliance  We discussed: Current pharmacy is preferred with insurance plan and patient is satisfied with pharmacy services Patient decided to: Continue current medication management strategy  Care Plan and Follow Up Patient Decision:  Patient agrees to Care Plan and Follow-up.  Plan: Telephone follow up appointment with care management team member scheduled for:  3 months  Tomasa Blase, PharmD Clinical Pharmacist, [place]

## 2021-04-19 ENCOUNTER — Telehealth: Payer: Self-pay | Admitting: Internal Medicine

## 2021-04-20 NOTE — Telephone Encounter (Signed)
Forwarding to [facility] since the order was indeed placed for the ONO on bipap  Procedure: Pulse oximetry, overnight Status: Needs Scheduling  Requested appt date:   Authorizing: Martyn Ehrich, NP in [place]  Expires: 03/22/2022 Priority: Routine  Diagnosis: SOB (shortness of breath) [R06.02]   Comments  On bipap  Notes  12/21-message sent to [employer] & confirmation received from Danielle/spm

## 2021-04-20 NOTE — Telephone Encounter (Signed)
Will hold in triage while we await a response.

## 2021-04-20 NOTE — Telephone Encounter (Signed)
Spoke to [place] with [employer] who confirmed this was received by them on 03/23/21.  Pt was scheduled for the evaluation on 1/4 & did his assessment for POC.  He did not qualify for POC.  She is going to check with Danielle on this ONO order to see what is going on with it.

## 2021-04-21 ENCOUNTER — Ambulatory Visit (INDEPENDENT_AMBULATORY_CARE_PROVIDER_SITE_OTHER): Payer: Medicare Other | Admitting: Internal Medicine

## 2021-04-21 ENCOUNTER — Other Ambulatory Visit: Payer: Self-pay

## 2021-04-21 ENCOUNTER — Encounter: Payer: Self-pay | Admitting: Internal Medicine

## 2021-04-21 VITALS — BP 135/68 | HR 76 | Ht 72.0 in | Wt 380.0 lb

## 2021-04-21 DIAGNOSIS — I2699 Other pulmonary embolism without acute cor pulmonale: Secondary | ICD-10-CM

## 2021-04-21 DIAGNOSIS — G4733 Obstructive sleep apnea (adult) (pediatric): Secondary | ICD-10-CM

## 2021-04-21 DIAGNOSIS — I5032 Chronic diastolic (congestive) heart failure: Secondary | ICD-10-CM | POA: Diagnosis not present

## 2021-04-21 DIAGNOSIS — I272 Pulmonary hypertension, unspecified: Secondary | ICD-10-CM | POA: Diagnosis not present

## 2021-04-21 DIAGNOSIS — R0609 Other forms of dyspnea: Secondary | ICD-10-CM

## 2021-04-21 NOTE — Telephone Encounter (Signed)
Per [name] w/ [employer], pt has been scheduled for pick up for ONO 1/19 or 1/30 per pt's request.

## 2021-04-21 NOTE — Patient Instructions (Signed)
Medication Instructions:  Your physician recommends that you continue on your current medications as directed. Please refer to the Current Medication list given to you today.  *If you need a refill on your cardiac medications before your next appointment, please call your pharmacy*   Follow-Up: At [facility], you and your health needs are our priority.  As part of our continuing mission to provide you with exceptional heart care, we have created designated Provider Care Teams.  These Care Teams include your primary Cardiologist (physician) and Advanced Practice Providers (APPs -  Physician Assistants and Nurse Practitioners) who all work together to provide you with the care you need, when you need it.  We recommend signing up for the patient portal called "MyChart".  Sign up information is provided on this After Visit Summary.  MyChart is used to connect with patients for Virtual Visits (Telemedicine).  Patients are able to view lab/test results, encounter notes, upcoming appointments, etc.  Non-urgent messages can be sent to your provider as well.   To learn more about what you can do with MyChart, go to [email or web address].    Your next appointment:   6 month(s)  The format for your next appointment:   In Person  Provider:   Kenneth C [name], MD {  

## 2021-04-21 NOTE — Telephone Encounter (Signed)
[facility], did you hear anything back about the ONO status yet? Thanks!

## 2021-04-21 NOTE — Progress Notes (Signed)
OFFICE NOTE  Chief Complaint:  Follow-up dyspnea  Primary Care Physician: Janith Lima, MD  HPI:  [name] is a 76 y/o male with a history of bariatric surgery, one year ago, lost about 85 pounds before and after surgery. Also has a history of hypertension and sleep apnea. He has been admitted twice in the last month with an unusual presentation with multiple medical problems, including pulmonary embolus, moderate to large pericardial effusion, new onset a-fib and GI bleed. His PE was diagnosed by his PCP through outpatient w/u and he was placed on Xarelto. His first hospital admission was from 4/15-4/24. At that time his CC was chest pain. W/u suggested a diagnosis of pericarditis. An Echocardiogram was done, which showed moderate to large pericardial effusion. Meanwhile the night of 4/16 he went in to afib with RVR, probably secondary to PE and was transferred to step down and started on cardizem drip. He converted to sinus the same night , cardizem discontinued and he was resumed on metoprolol. CT surgery was consulted for pericardial window. He underwent pericardial window on 4/17 and hemorrhagic fluid was drained and was sent for analysis. Gram stain and cultures were negative. He was also started on colchicine for his pericarditis. The evening of 4/20 pt went back in to afib with RVR and had bloody bowel movement. His xarelto was discontinued and he was put on cardizem gtt. Over the next 24 hours his hemoglobin dropped to 7.8 and he received 2 units of prbc transfusion. His repeat H&H improved. Dr Henrene Pastor was consulted from GI on 4/20 and he underwent endoscopy on 4/21 . He was found to have superficial ulcers in the stomach, underwent biopsy of the area. He was recommended to continue on PPI twice daily for 2 weeks and resume xarelto. He was discharged home then returned 3 days later on 07/28/13 with a complaint of increased fatigue and SOB. Repeat 2D echo demonstrated only minimal  residual effusion with normal RV and LV function. 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 He was admitted and underwent a repeat EGD which showed healing of ulcers, no bleed. He was continued on PO iron and a PPI. His Hgb at time of discharge was stable at 9.0. He was discharged home. Cardiology had recommended an OP stress test. This was done 08/03/13 and showed normal myocardial perfusion and normal LV EF of 58%.  He returns today and has reported a marked improvement in his shortness of breath. He is able to do most activities without any difficulty. He continues on Eliquis for his pulmonary embolus and for stroke prevention. He is on flecainide for antiarrhythmic therapy. He denies any chest pain. His only complaint is fatigue. He reports some fatigue and notices it worse as he has been working on refurbishing a house that his mother used to own to sell. He reports his sleep is good at night however he does have difficulty falling asleep which may be playing a role in his fatigue.  [name] returns today for follow-up. He reports no significant difference in his symptoms. He gets some mild shortness of breath with exercise which she does very infrequently. He also reports leg swelling which is persistent. Weight has been fairly stable although could stand to go down. We talked about ways he could work on weight loss. He does have a history of lap band in the past however he is not been able to lose anymore weight and in fact  has gained it back. He denies any recurrent palpitation or A. Fib that he is aware of. He continues on flecainide which he is tolerating without any difficulty.  04/28/2016  [name] was seen back today in follow-up. He continues to complain of shortness of breath, persistent with exertion. Weight is now up from 337-350 pounds. He is celebrating the birth of this newest grandchild and wants to be more active and is frustrated that he is not able to do much activity  without shortness of breath. He is on flecainide without any recurrent atrial fibrillation. His last stress test was more than 2 years ago and he is due for repeat stress test.  05/23/2016  [name] returns today for follow-up. He is unfortunately gained another few pounds although he felt that this is related to stuff in his pockets. His stress test was negative for ischemia. It is noted that his blood pressure is high again today 168/89. He was previously elevated over 150. He does have a history of hypertension and was on Lotrel in the past but was taken off of that due to hypotension after significant weight loss that occurred after lap band surgery. Unfortunately he has gained back most of that weight and likely is hypertensive secondary to that.  11/22/2016  [name] was seen today in follow-up. He continues to have some progressive shortness of breath with exertion. 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 I wonder if this is playing a role in upper airway obstruction which could be causing his shortness of breath. Blood pressure appears to be better controlled today although he was taken off of blood pressure medications when he had a stroke. He was also previously on a number of inhalers including Symbicort and Breo Ellipta - these have also been discontinued.  02/20/2017  [name] returns today for follow-up.  I referred him to pulmonary for evaluation of his dyspnea.  He had no improvement with inhalers and underwent repeat pulmonary function testing by Dr. Lake Bells, which failed to reveal any significant obstructive pulmonary disease.  It was felt that he might be volume overloaded and he was switched from Lasix to torsemide.  It appears the dose was equivalent.  He is not diuresed anymore and says that he is not more short of breath however we will need to recheck his metabolic profile to ensure  he is not being over diuresed.  With regards to vocal cord dysfunction, he has follow-up at [facility] for evaluation of possible surgery.  It is not clear whether this will improve his shortness of breath not and I advised him to ask his surgeon whether the surgery would help more with his breathing or phonation.  01/09/2018  [name] is seen today in follow-up.  Overall he continues to have shortness of breath.  Weight is still significant issue.  He is essentially gained back all the weight that he lost after weight loss surgery.  He was recently seen by Rosaria Ferries, PA-C.  Who noted that his weighted maintained a stable level on torsemide 60 mg daily.  This was previously increased by his PCP.  He continues to keep off the edema but has had no significant change in his shortness of breath.  He is been working with Dr. Letta Pate in [facility], who has been giving him pain injections in his back.  Recently this is been helpful for him.  12/04/2019  [name] is seen today in follow-up.  He has persistent shortness of breath which worsened significantly recently when he was off of his torsemide for several days.  He then restarted it and ultimately got back to his baseline.  Weight is up about 8 pounds and has had continued issue losing it.  He is concerned about the cost of Eliquis and asked for some samples today.  He is taking the medicine.  He is not aware of any A. fib.  EKG does not show A. fib today.  He does have a history of LAP-BAND procedure but did not follow-up with general surgery after that.  06/10/2020  [name] returns today for follow-up.  Weight continues to be an issue and is not significantly lower.  He is now 390 pounds.  He is having dyspnea with minimal exertion.  He denies any recurrent A. fib.  He is on the flecainide.  He also takes Eliquis.  He says he is going to try to lose weight with dietary approaches.  I did offer the comprehensive weight management clinic but he  declined at this time.  04/21/2021  [name]  is seen today for follow-up.  He continues to endorse dyspnea.  He is working with pulmonary on this.  Primarily he notes shortness of breath associated with hypoxemia with minimal exertion.  He says his oxygen saturations dropped down into the 70% range on oxygen with minimal exertion.  He had a repeat echo April 12, 2021 which showed normal LVEF 60 to 65% and moderate pulmonary hypertension with RVSP 48.4 mmHg.  The aorta was mildly dilated at 40 mm.  No signs of elevated left heart pressure.  The IVC was considered normal.  He was seen by pulmonary in December on December 20 and had a BNP which was 71 (normal) and improved from previous BMPs in the past.  Based on that I agree he appears to be euvolemic but continues to have issues with exercise-induced desaturation.  We discussed about possible causes of this and I am concerned that exercise-induced pulmonary hypertension is a big possibility or possibly shunting.  PMHx:  Past Medical History:  Diagnosis Date   Anemia 07/29/2013   Asthma    childhood asthma   Benign essential HTN    Benign prostatic hypertrophy    several prostate biopsies neg for cancer.    Bilateral hip pain    Cancer (HCC)    CHF (congestive heart failure) (HCC)    Chronic anticoagulation 3/76/2831   Complication of anesthesia    MORPHINE CAUSES HALLUCINATIONS   CVA (cerebral vascular accident) ([place]) 06/2016   Depression    Diastolic congestive heart failure ([place]) 07/16/2013   Dysphagia, post-stroke    Embolic cerebral infarction ([place]) 06/21/2016   FH: colonic polyps 2010   Gout    History of kidney stones    Hypertension    Kidney cysts    Morbid obesity (HCC)    Numbness    feet / legs   PAF (paroxysmal atrial fibrillation) with RVR 07/29/2013   Pericarditis 2015   s/p window   Recurrent pulmonary emboli ([place]) 06/13/2015   Sleep apnea    USES C PAP   Stroke syndrome    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     Past Surgical History:  Procedure Laterality Date   ARTHROSCOPY KNEE W/ DRILLING     BREATH TEK H PYLORI N/A 12/23/2012   Procedure: BREATH TEK H PYLORI;  Surgeon: Gayland Curry, MD;  Location:  [facility] ENDOSCOPY;  Service: General;  Laterality: N/A;   CHOLECYSTECTOMY  1989   COLONOSCOPY N/A 07/31/2013   Procedure: COLONOSCOPY;  Surgeon: Gatha Mayer, MD;  Location: [place];  Service: Endoscopy;  Laterality: N/A;   ESOPHAGOGASTRODUODENOSCOPY N/A 07/22/2013   Procedure: ESOPHAGOGASTRODUODENOSCOPY (EGD);  Surgeon: Irene Shipper, MD;  Location: [facility] ENDOSCOPY;  Service: Endoscopy;  Laterality: N/A;   ESOPHAGOGASTRODUODENOSCOPY N/A 07/31/2013   Procedure: ESOPHAGOGASTRODUODENOSCOPY (EGD);  Surgeon: Gatha Mayer, MD;  Location: [facility] ENDOSCOPY;  Service: Endoscopy;  Laterality: N/A;   INTRAOPERATIVE TRANSESOPHAGEAL ECHOCARDIOGRAM N/A 07/18/2013   Procedure: INTRAOPERATIVE TRANSESOPHAGEAL ECHOCARDIOGRAM;  Surgeon: Grace Isaac, MD;  Location: [facility];  Service: Open Heart Surgery;  Laterality: N/A;   LAPAROSCOPIC GASTRIC BANDING WITH HIATAL HERNIA REPAIR N/A 04/08/2013   Procedure: LAPAROSCOPIC GASTRIC BANDING WITH possible  HIATAL HERNIA REPAIR;  Surgeon: Gayland Curry, MD;  Location: [facility]S;  Service: General;  Laterality: N/A;   LITHOTRIPSY     renal calculi     SUBXYPHOID PERICARDIAL WINDOW N/A 07/18/2013   Procedure: SUBXYPHOID PERICARDIAL WINDOW;  Surgeon: Grace Isaac, MD;  Location: [facility] OR;  Service: Thoracic;  Laterality: N/A;   TONSILLECTOMY     total knee replacment     bilat   trigger finger repair     also lue, cts surgically repaired   WISDOM TOOTH EXTRACTION      FAMHx:  Family History  Problem Relation Age of Onset   Depression Mother    Hypertension Mother    Dementia Mother    Heart disease Father        MI   Depression Brother    Stroke Paternal Aunt        CVA   Diabetes Paternal Uncle    Heart disease Paternal Uncle        MI   Heart disease Paternal  Grandmother        MI   Diabetes Cousin        PATERNAL    SOCHx:   reports that he quit smoking about 46 years ago. His smoking use included cigarettes. He started smoking about 60 years ago. He has a 2.60 pack-year smoking history. He has never used smokeless tobacco. He reports that he does not drink alcohol and does not use drugs.  ALLERGIES:  Allergies  Allergen Reactions   Morphine Nausea And Vomiting and Other (See Comments)    Severe hallucinations Other reaction(s): Hallucinations   Penicillins Other (See Comments)    Passed out after injection Did it involve swelling of the face/tongue/throat, SOB, or low BP? Unknown Did it involve sudden or severe rash/hives, skin peeling, or any reaction on the inside of your mouth or nose? No Did you need to seek medical attention at a hospital or doctor's office? Yes When did it last happen?    teenager   If all above answers are NO, may proceed with cephalosporin use. Other reaction(s): Dizziness, Unknown   Darvon [Propoxyphene]     Other reaction(s): Unknown   Morphine Sulfate     Other reaction(s): Unknown   Codeine Nausea Only    Other reaction(s): Unknown   Propoxyphene N-Acetaminophen Nausea Only    ROS: Pertinent items noted in HPI and remainder of comprehensive ROS otherwise negative.  HOME MEDS: Current Outpatient Medications  Medication Sig Dispense Refill   atorvastatin (LIPITOR) 80 MG tablet TAKE 1 TABLET(80 MG) BY MOUTH DAILY 90 tablet 1   [employer] MELATONIN GUMMIES PO Take 5 mg by mouth.  Docusate Sodium (DSS) 100 MG CAPS Take 100 mg by mouth every other day.     ELIQUIS 5 MG TABS tablet TAKE 1 TABLET(5 MG) BY MOUTH TWICE DAILY 180 tablet 0   finasteride (PROSCAR) 5 MG tablet Take 1 tablet (5 mg total) by mouth daily. 30 tablet 1   flecainide (TAMBOCOR) 50 MG tablet Take 1 tablet (50 mg total) by mouth every 12 (twelve) hours. 180 tablet 3   loratadine (CLARITIN) 10 MG tablet Take 10 mg by mouth daily.      LORazepam (ATIVAN) 0.5 MG tablet Take 1 tablet (0.5 mg total) by mouth at bedtime. 90 tablet 1   montelukast (SINGULAIR) 10 MG tablet TAKE 1 TABLET(10 MG) BY MOUTH AT BEDTIME 90 tablet 1   Multiple Vitamin (MULTIVITAMIN WITH MINERALS) TABS tablet Take 1 tablet by mouth daily.     pantoprazole (PROTONIX) 40 MG tablet TAKE 1 TABLET(40 MG) BY MOUTH DAILY 90 tablet 1   potassium chloride SA (KLOR-CON M) 20 MEQ tablet TAKE 1 TABLET BY MOUTH TWICE DAILY 180 tablet 3   rOPINIRole (REQUIP) 0.5 MG tablet Take 1 tablet (0.5 mg total) by mouth 3 (three) times daily. (Patient taking differently: Take 1.5 mg by mouth at bedtime.) 270 tablet 2   sertraline (ZOLOFT) 100 MG tablet Take 1.5 tablets (150 mg total) by mouth at bedtime. 135 tablet 1   torsemide (DEMADEX) 20 MG tablet TAKE 3 TABLETS BY MOUTH ONCE DAILY. 270 tablet 3   traMADol (ULTRAM) 50 MG tablet TAKE 1 TABLET(50 MG) BY MOUTH EVERY 8 HOURS AS NEEDED 90 tablet 5   Semaglutide (RYBELSUS) 3 MG TABS Take 3 mg by mouth daily. (Patient not taking: Reported on 04/21/2021) 30 tablet 0   No current facility-administered medications for this visit.    LABS/IMAGING: No results found. However, due to the size of the patient record, not all encounters were searched. Please check Results Review for a complete set of results. No results found.  VITALS: BP 135/68    Pulse 76    Ht 6' (1.829 m)    Wt (!) 380 lb (172.4 kg)    SpO2 98%    BMI 51.54 kg/m   EXAM: General appearance: alert, no distress, morbidly obese, and on oxygen Neck: no carotid bruit, no JVD, and thyroid not enlarged, symmetric, no tenderness/mass/nodules Lungs: diminished breath sounds bilaterally Heart: regular rate and rhythm Abdomen: morbidly obese Extremities: extremities normal, atraumatic, no cyanosis or edema Pulses: 2+ and symmetric Skin: pale, warm, diaphoretic Neurologic: Grossly normal Psych: Pleasant  EKG: Deferred  ASSESSMENT: Persistent dyspnea on exertion, with  exercise-induced desaturations 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   Mr. Desa notes significant desaturations with exertion concerning for possible pulmonary hypertension that is exercise-induced or shunting.  His recent echo did not include a bubble study.  We may wish to evaluate for that or consider a right heart cath with shunt run.  I like for him to discuss this with his pulmonologist Dr. Annamaria Boots.  In addition we will consider exercise-induced pulmonary hypertension.  I am happy to assist with further work-up of this including the catheterization of is felt to be helpful.  I do not believe he needs adjustments in his diuretics at this time.  Follow-up with me in 6 months or sooner as necessary.  Pixie Casino, MD, [facility], [place] Director of the Advanced Lipid Disorders &  Cardiovascular Risk Reduction Clinic Attending Cardiologist  Direct Dial: [phone]   Fax: [phone]  Website:  [email or web address] 04/21/2021, 3:03 PM

## 2021-04-21 NOTE — H&P (View-Only) (Signed)
OFFICE NOTE  Chief Complaint:  Follow-up dyspnea  Primary Care Physician: Janith Lima, MD  HPI:  [name] is a 76 y/o male with a history of bariatric surgery, one year ago, lost about 85 pounds before and after surgery. Also has a history of hypertension and sleep apnea. He has been admitted twice in the last month with an unusual presentation with multiple medical problems, including pulmonary embolus, moderate to large pericardial effusion, new onset a-fib and GI bleed. His PE was diagnosed by his PCP through outpatient w/u and he was placed on Xarelto. His first hospital admission was from 4/15-4/24. At that time his CC was chest pain. W/u suggested a diagnosis of pericarditis. An Echocardiogram was done, which showed moderate to large pericardial effusion. Meanwhile the night of 4/16 he went in to afib with RVR, probably secondary to PE and was transferred to step down and started on cardizem drip. He converted to sinus the same night , cardizem discontinued and he was resumed on metoprolol. CT surgery was consulted for pericardial window. He underwent pericardial window on 4/17 and hemorrhagic fluid was drained and was sent for analysis. Gram stain and cultures were negative. He was also started on colchicine for his pericarditis. The evening of 4/20 pt went back in to afib with RVR and had bloody bowel movement. His xarelto was discontinued and he was put on cardizem gtt. Over the next 24 hours his hemoglobin dropped to 7.8 and he received 2 units of prbc transfusion. His repeat H&H improved. Dr Henrene Pastor was consulted from GI on 4/20 and he underwent endoscopy on 4/21 . He was found to have superficial ulcers in the stomach, underwent biopsy of the area. He was recommended to continue on PPI twice daily for 2 weeks and resume xarelto. He was discharged home then returned 3 days later on 07/28/13 with a complaint of increased fatigue and SOB. Repeat 2D echo demonstrated only minimal  residual effusion with normal RV and LV function. 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 He was admitted and underwent a repeat EGD which showed healing of ulcers, no bleed. He was continued on PO iron and a PPI. His Hgb at time of discharge was stable at 9.0. He was discharged home. Cardiology had recommended an OP stress test. This was done 08/03/13 and showed normal myocardial perfusion and normal LV EF of 58%.  He returns today and has reported a marked improvement in his shortness of breath. He is able to do most activities without any difficulty. He continues on Eliquis for his pulmonary embolus and for stroke prevention. He is on flecainide for antiarrhythmic therapy. He denies any chest pain. His only complaint is fatigue. He reports some fatigue and notices it worse as he has been working on refurbishing a house that his mother used to own to sell. He reports his sleep is good at night however he does have difficulty falling asleep which may be playing a role in his fatigue.  [name] returns today for follow-up. He reports no significant difference in his symptoms. He gets some mild shortness of breath with exercise which she does very infrequently. He also reports leg swelling which is persistent. Weight has been fairly stable although could stand to go down. We talked about ways he could work on weight loss. He does have a history of lap band in the past however he is not been able to lose anymore weight and in fact  has gained it back. He denies any recurrent palpitation or A. Fib that he is aware of. He continues on flecainide which he is tolerating without any difficulty.  04/28/2016  [name] was seen back today in follow-up. He continues to complain of shortness of breath, persistent with exertion. Weight is now up from 337-350 pounds. He is celebrating the birth of this newest grandchild and wants to be more active and is frustrated that he is not able to do much activity  without shortness of breath. He is on flecainide without any recurrent atrial fibrillation. His last stress test was more than 2 years ago and he is due for repeat stress test.  05/23/2016  [name] returns today for follow-up. He is unfortunately gained another few pounds although he felt that this is related to stuff in his pockets. His stress test was negative for ischemia. It is noted that his blood pressure is high again today 168/89. He was previously elevated over 150. He does have a history of hypertension and was on Lotrel in the past but was taken off of that due to hypotension after significant weight loss that occurred after lap band surgery. Unfortunately he has gained back most of that weight and likely is hypertensive secondary to that.  11/22/2016  [name] was seen today in follow-up. He continues to have some progressive shortness of breath with exertion. 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 I wonder if this is playing a role in upper airway obstruction which could be causing his shortness of breath. Blood pressure appears to be better controlled today although he was taken off of blood pressure medications when he had a stroke. He was also previously on a number of inhalers including Symbicort and Breo Ellipta - these have also been discontinued.  02/20/2017  [name] returns today for follow-up.  I referred him to pulmonary for evaluation of his dyspnea.  He had no improvement with inhalers and underwent repeat pulmonary function testing by Dr. Lake Bells, which failed to reveal any significant obstructive pulmonary disease.  It was felt that he might be volume overloaded and he was switched from Lasix to torsemide.  It appears the dose was equivalent.  He is not diuresed anymore and says that he is not more short of breath however we will need to recheck his metabolic profile to ensure  he is not being over diuresed.  With regards to vocal cord dysfunction, he has follow-up at [facility] for evaluation of possible surgery.  It is not clear whether this will improve his shortness of breath not and I advised him to ask his surgeon whether the surgery would help more with his breathing or phonation.  01/09/2018  [name] is seen today in follow-up.  Overall he continues to have shortness of breath.  Weight is still significant issue.  He is essentially gained back all the weight that he lost after weight loss surgery.  He was recently seen by Rosaria Ferries, PA-C.  Who noted that his weighted maintained a stable level on torsemide 60 mg daily.  This was previously increased by his PCP.  He continues to keep off the edema but has had no significant change in his shortness of breath.  He is been working with Dr. Letta Pate in [facility], who has been giving him pain injections in his back.  Recently this is been helpful for him.  12/04/2019  [name] is seen today in follow-up.  He has persistent shortness of breath which worsened significantly recently when he was off of his torsemide for several days.  He then restarted it and ultimately got back to his baseline.  Weight is up about 8 pounds and has had continued issue losing it.  He is concerned about the cost of Eliquis and asked for some samples today.  He is taking the medicine.  He is not aware of any A. fib.  EKG does not show A. fib today.  He does have a history of LAP-BAND procedure but did not follow-up with general surgery after that.  06/10/2020  [name] returns today for follow-up.  Weight continues to be an issue and is not significantly lower.  He is now 390 pounds.  He is having dyspnea with minimal exertion.  He denies any recurrent A. fib.  He is on the flecainide.  He also takes Eliquis.  He says he is going to try to lose weight with dietary approaches.  I did offer the comprehensive weight management clinic but he  declined at this time.  04/21/2021  [name]  is seen today for follow-up.  He continues to endorse dyspnea.  He is working with pulmonary on this.  Primarily he notes shortness of breath associated with hypoxemia with minimal exertion.  He says his oxygen saturations dropped down into the 70% range on oxygen with minimal exertion.  He had a repeat echo April 12, 2021 which showed normal LVEF 60 to 65% and moderate pulmonary hypertension with RVSP 48.4 mmHg.  The aorta was mildly dilated at 40 mm.  No signs of elevated left heart pressure.  The IVC was considered normal.  He was seen by pulmonary in December on December 20 and had a BNP which was 71 (normal) and improved from previous BMPs in the past.  Based on that I agree he appears to be euvolemic but continues to have issues with exercise-induced desaturation.  We discussed about possible causes of this and I am concerned that exercise-induced pulmonary hypertension is a big possibility or possibly shunting.  PMHx:  Past Medical History:  Diagnosis Date   Anemia 07/29/2013   Asthma    childhood asthma   Benign essential HTN    Benign prostatic hypertrophy    several prostate biopsies neg for cancer.    Bilateral hip pain    Cancer (HCC)    CHF (congestive heart failure) (HCC)    Chronic anticoagulation 1/94/1740   Complication of anesthesia    MORPHINE CAUSES HALLUCINATIONS   CVA (cerebral vascular accident) ([place]) 06/2016   Depression    Diastolic congestive heart failure ([place]) 07/16/2013   Dysphagia, post-stroke    Embolic cerebral infarction ([place]) 06/21/2016   FH: colonic polyps 2010   Gout    History of kidney stones    Hypertension    Kidney cysts    Morbid obesity (HCC)    Numbness    feet / legs   PAF (paroxysmal atrial fibrillation) with RVR 07/29/2013   Pericarditis 2015   s/p window   Recurrent pulmonary emboli ([place]) 06/13/2015   Sleep apnea    USES C PAP   Stroke syndrome    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     Past Surgical History:  Procedure Laterality Date   ARTHROSCOPY KNEE W/ DRILLING     BREATH TEK H PYLORI N/A 12/23/2012   Procedure: BREATH TEK H PYLORI;  Surgeon: Gayland Curry, MD;  Location:  [facility] ENDOSCOPY;  Service: General;  Laterality: N/A;   CHOLECYSTECTOMY  1989   COLONOSCOPY N/A 07/31/2013   Procedure: COLONOSCOPY;  Surgeon: Gatha Mayer, MD;  Location: [place];  Service: Endoscopy;  Laterality: N/A;   ESOPHAGOGASTRODUODENOSCOPY N/A 07/22/2013   Procedure: ESOPHAGOGASTRODUODENOSCOPY (EGD);  Surgeon: Irene Shipper, MD;  Location: [facility] ENDOSCOPY;  Service: Endoscopy;  Laterality: N/A;   ESOPHAGOGASTRODUODENOSCOPY N/A 07/31/2013   Procedure: ESOPHAGOGASTRODUODENOSCOPY (EGD);  Surgeon: Gatha Mayer, MD;  Location: [facility] ENDOSCOPY;  Service: Endoscopy;  Laterality: N/A;   INTRAOPERATIVE TRANSESOPHAGEAL ECHOCARDIOGRAM N/A 07/18/2013   Procedure: INTRAOPERATIVE TRANSESOPHAGEAL ECHOCARDIOGRAM;  Surgeon: Grace Isaac, MD;  Location: [place];  Service: Open Heart Surgery;  Laterality: N/A;   LAPAROSCOPIC GASTRIC BANDING WITH HIATAL HERNIA REPAIR N/A 04/08/2013   Procedure: LAPAROSCOPIC GASTRIC BANDING WITH possible  HIATAL HERNIA REPAIR;  Surgeon: Gayland Curry, MD;  Location: [facility] ORS;  Service: General;  Laterality: N/A;   LITHOTRIPSY     renal calculi     SUBXYPHOID PERICARDIAL WINDOW N/A 07/18/2013   Procedure: SUBXYPHOID PERICARDIAL WINDOW;  Surgeon: Grace Isaac, MD;  Location: [facility] OR;  Service: Thoracic;  Laterality: N/A;   TONSILLECTOMY     total knee replacment     bilat   trigger finger repair     also lue, cts surgically repaired   WISDOM TOOTH EXTRACTION      FAMHx:  Family History  Problem Relation Age of Onset   Depression Mother    Hypertension Mother    Dementia Mother    Heart disease Father        MI   Depression Brother    Stroke Paternal Aunt        CVA   Diabetes Paternal Uncle    Heart disease Paternal Uncle        MI   Heart disease Paternal  Grandmother        MI   Diabetes Cousin        PATERNAL    SOCHx:   reports that he quit smoking about 46 years ago. His smoking use included cigarettes. He started smoking about 60 years ago. He has a 2.60 pack-year smoking history. He has never used smokeless tobacco. He reports that he does not drink alcohol and does not use drugs.  ALLERGIES:  Allergies  Allergen Reactions   Morphine Nausea And Vomiting and Other (See Comments)    Severe hallucinations Other reaction(s): Hallucinations   Penicillins Other (See Comments)    Passed out after injection Did it involve swelling of the face/tongue/throat, SOB, or low BP? Unknown Did it involve sudden or severe rash/hives, skin peeling, or any reaction on the inside of your mouth or nose? No Did you need to seek medical attention at a hospital or doctor's office? Yes When did it last happen?    teenager   If all above answers are NO, may proceed with cephalosporin use. Other reaction(s): Dizziness, Unknown   Darvon [Propoxyphene]     Other reaction(s): Unknown   Morphine Sulfate     Other reaction(s): Unknown   Codeine Nausea Only    Other reaction(s): Unknown   Propoxyphene N-Acetaminophen Nausea Only    ROS: Pertinent items noted in HPI and remainder of comprehensive ROS otherwise negative.  HOME MEDS: Current Outpatient Medications  Medication Sig Dispense Refill   atorvastatin (LIPITOR) 80 MG tablet TAKE 1 TABLET(80 MG) BY MOUTH DAILY 90 tablet 1   [employer] MELATONIN GUMMIES PO Take 5 mg by mouth.  Docusate Sodium (DSS) 100 MG CAPS Take 100 mg by mouth every other day.     ELIQUIS 5 MG TABS tablet TAKE 1 TABLET(5 MG) BY MOUTH TWICE DAILY 180 tablet 0   finasteride (PROSCAR) 5 MG tablet Take 1 tablet (5 mg total) by mouth daily. 30 tablet 1   flecainide (TAMBOCOR) 50 MG tablet Take 1 tablet (50 mg total) by mouth every 12 (twelve) hours. 180 tablet 3   loratadine (CLARITIN) 10 MG tablet Take 10 mg by mouth daily.      LORazepam (ATIVAN) 0.5 MG tablet Take 1 tablet (0.5 mg total) by mouth at bedtime. 90 tablet 1   montelukast (SINGULAIR) 10 MG tablet TAKE 1 TABLET(10 MG) BY MOUTH AT BEDTIME 90 tablet 1   Multiple Vitamin (MULTIVITAMIN WITH MINERALS) TABS tablet Take 1 tablet by mouth daily.     pantoprazole (PROTONIX) 40 MG tablet TAKE 1 TABLET(40 MG) BY MOUTH DAILY 90 tablet 1   potassium chloride SA (KLOR-CON M) 20 MEQ tablet TAKE 1 TABLET BY MOUTH TWICE DAILY 180 tablet 3   rOPINIRole (REQUIP) 0.5 MG tablet Take 1 tablet (0.5 mg total) by mouth 3 (three) times daily. (Patient taking differently: Take 1.5 mg by mouth at bedtime.) 270 tablet 2   sertraline (ZOLOFT) 100 MG tablet Take 1.5 tablets (150 mg total) by mouth at bedtime. 135 tablet 1   torsemide (DEMADEX) 20 MG tablet TAKE 3 TABLETS BY MOUTH ONCE DAILY. 270 tablet 3   traMADol (ULTRAM) 50 MG tablet TAKE 1 TABLET(50 MG) BY MOUTH EVERY 8 HOURS AS NEEDED 90 tablet 5   Semaglutide (RYBELSUS) 3 MG TABS Take 3 mg by mouth daily. (Patient not taking: Reported on 04/21/2021) 30 tablet 0   No current facility-administered medications for this visit.    LABS/IMAGING: No results found. However, due to the size of the patient record, not all encounters were searched. Please check Results Review for a complete set of results. No results found.  VITALS: BP 135/68    Pulse 76    Ht 6' (1.829 m)    Wt (!) 380 lb (172.4 kg)    SpO2 98%    BMI 51.54 kg/m   EXAM: General appearance: alert, no distress, morbidly obese, and on oxygen Neck: no carotid bruit, no JVD, and thyroid not enlarged, symmetric, no tenderness/mass/nodules Lungs: diminished breath sounds bilaterally Heart: regular rate and rhythm Abdomen: morbidly obese Extremities: extremities normal, atraumatic, no cyanosis or edema Pulses: 2+ and symmetric Skin: pale, warm, diaphoretic Neurologic: Grossly normal Psych: Pleasant  EKG: Deferred  ASSESSMENT: Persistent dyspnea on exertion, with  exercise-induced desaturations 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   Mr. Mcbain notes significant desaturations with exertion concerning for possible pulmonary hypertension that is exercise-induced or shunting.  His recent echo did not include a bubble study.  We may wish to evaluate for that or consider a right heart cath with shunt run.  I like for him to discuss this with his pulmonologist Dr. Annamaria Boots.  In addition we will consider exercise-induced pulmonary hypertension.  I am happy to assist with further work-up of this including the catheterization of is felt to be helpful.  I do not believe he needs adjustments in his diuretics at this time.  Follow-up with me in 6 months or sooner as necessary.  Pixie Casino, MD, [facility], [place] Director of the Advanced Lipid Disorders &  Cardiovascular Risk Reduction Clinic Attending Cardiologist  Direct Dial: [phone]   Fax: [phone]  Website:  [email or web address] 04/21/2021, 3:03 PM

## 2021-04-24 ENCOUNTER — Encounter: Payer: Self-pay | Admitting: Primary Care

## 2021-04-24 DIAGNOSIS — R0683 Snoring: Secondary | ICD-10-CM | POA: Diagnosis not present

## 2021-04-24 DIAGNOSIS — G4730 Sleep apnea, unspecified: Secondary | ICD-10-CM | POA: Diagnosis not present

## 2021-04-25 NOTE — Progress Notes (Signed)
HPI male former smoker followed for OSA, Restless Legs,, complicated by A. fib, recurrent PE, bariatric surgery, major depression, dysphonia, midbrain CVA NPSG 10/25/05- severe OSA, AHI 79/ hr, CPAP to 16. Frequent limb jerks with sleep disturbance PFT 01/03/17-minimal obstruction without response to bronchodilator.  FVC 3.94/83%, FEV1 3.00/86%, ratio 2.76, TLC 96%, DLCO 78% Walk test on room air 04/08/2018-  Max HR 91, Min Sat 92% Walk Test on room air 04/26/2021-he was barely able to walk into the bathroom and out on room air before he desaturated to 80%. Arrival room air saturation today at rest 92%. -----------------------------------------------------------------------------------   07/19/20- 76 year old male former smoker followed for OSA, Restless Legs, complicated by A. Fib, diastolic CHF recurrent PE, bariatric surgery/ Morbid Obesity, , major depression, dysphonia, midbrain CVA, Recurrent PE,  -Melatonin, Lorazepam, Requip, Zoloft,  BiPap 25/ 22/[employer] Downoad-compliance 97%, AHI 2.8/ hr Body weight today-398 lbs Covid vax-3 Phizer Flu vax-had -----Patient states that his mask does not fit very well but the other mask that he used to have was to big. Patient states that he has trouble going to sleep but when he does sleep he sleeps fine.  Mask uncomfortable, creeps up on face. Discussed sedative meds.  04/26/21- 76 year old male former smoker followed for OSA, Restless Legs, complicated by A. Fib, diastolic CHF recurrent PE, bariatric surgery/ Morbid Obesity,  major depression, dysphonia, midbrain CVA, DM2, Gastric Ulcer, Moderate Pulmonary Hypertension,  -Melatonin, Lorazepam, Requip, Zoloft,  BiPap 25/ 22/[employer] O2 2l/ [employer]   Using self-fill tank but he's having trouble filling it.  Has had system x 2 weeks, ordered by Dr [name]/ Cardiology.  Download-compliance - requested Body weight today-387 lbs Covid vax-4 Phizer                          Looking for results on onox on BIPAP and O2  qualifying walk test Flu vax-had Cardiology concerned about exercise induce pulmonary hypertension or shunting to explain exercise induced hypoxemia. Consider bubble study or R heart cath. -----Patient states heart doctor wanted him to follow up and was supposed to send some notes over.  He notices just walking in house oxygen saturation drops even if he is wearing his portable oxygen.  Dyspnea on exertion.  He is planning to talk with his previous bariatric surgeon about dealing with ongoing obesity. Echocardiogram had shown right ventricular systolic 40.9, estimated right atrial pressure 3 mm. He denies acute event, chest pain, palpitation, cough or wheeze. Handles have failed on his heavy-duty rolling walker with seat and he asks our help to get it replaced. Walk Test on room air 04/26/2021-he was barely able to walk into the bathroom and out on room air before he desaturated to 80%.  Arrival room air saturation today at rest 92%. Overnight oximetry dated 12/20 is still listed as future- need to contact DME CTa chest 03/22/21- IMPRESSION: No evidence of pulmonary emboli. Mild atelectatic changes in the right base. Stable heterogeneity in the right lobe of the thyroid with stable appearing nodule dating back to 2018 consistent with a benign etiology. Not clinically significant; no follow-up imaging recommended (ref: J Am Coll Radiol. 2015 Feb;12(2): 143-50). Aortic Atherosclerosis (ICD10-I70.0).  ROS-see HPI   + = positive Constitutional:    weight loss, night sweats, fevers, chills, fatigue, lassitude. HEENT:    headaches, difficulty swallowing, tooth/dental problems, sore throat,       sneezing, itching, ear ache,+ nasal congestion, post nasal drip, snoring CV:    chest pain,  orthopnea, PND, swelling in lower extremities, anasarca,                                                 dizziness, +palpitations Resp:   +shortness of breath with exertion or at rest.                productive  cough,   non-productive cough, coughing up of blood.              change in color of mucus.  wheezing.   Skin:    rash or lesions. GI:  No-   heartburn, indigestion, abdominal pain, nausea, vomiting,  GU:  MS:   joint pain, stiffness, Neuro-     nothing unusual Psych:  change in mood or affect.  depression or anxiety.   memory loss.  OBJ- Physical Exam General- Alert, Oriented, Affect-appropriate, Distress- none acute, + morbidly obese, +power scooter Skin- rash-none, lesions- none, excoriation- none Lymphadenopathy- none Head- atraumatic            Eyes- Gross vision intact, PERRLA, conjunctivae and secretions clear            Ears- +Hearing aid            Nose-  +moderate turbinate edema and mucus congestion, no-Septal dev, mucus, polyps,  erosion, perforation             Throat- Mallampati III , mucosa clear , drainage- none, tonsils- atrophic, + hoarse Neck- flexible , trachea midline, no stridor , thyroid nl, carotid no bruit Chest - symmetrical excursion , unlabored           Heart/CV- RRR , no murmur , no gallop  , no rub, nl s1 s2                           - JVD- none , edema- none, stasis changes- none, varices- none           Lung- clear to P&A, Unlabot=red during quiet conversation, wheeze- none, cough- none , dullness-none, rub- none           Chest wall-  Abd-  Br/ Gen/ Rectal- Not done, not indicated Extrem- cyanosis- none, clubbing, none, atrophy- none, strength- nl Neuro- grossly intact to observation

## 2021-04-26 ENCOUNTER — Other Ambulatory Visit: Payer: Self-pay

## 2021-04-26 ENCOUNTER — Ambulatory Visit (INDEPENDENT_AMBULATORY_CARE_PROVIDER_SITE_OTHER): Payer: Medicare Other | Admitting: Internal Medicine

## 2021-04-26 ENCOUNTER — Encounter: Payer: Self-pay | Admitting: Internal Medicine

## 2021-04-26 VITALS — BP 100/60 | HR 70 | Temp 98.9°F | Ht 72.0 in | Wt 387.0 lb

## 2021-04-26 DIAGNOSIS — R0609 Other forms of dyspnea: Secondary | ICD-10-CM

## 2021-04-26 DIAGNOSIS — R0602 Shortness of breath: Secondary | ICD-10-CM | POA: Diagnosis not present

## 2021-04-26 DIAGNOSIS — J9611 Chronic respiratory failure with hypoxia: Secondary | ICD-10-CM

## 2021-04-26 DIAGNOSIS — G4733 Obstructive sleep apnea (adult) (pediatric): Secondary | ICD-10-CM | POA: Diagnosis not present

## 2021-04-26 NOTE — Patient Instructions (Signed)
Order- DME  heavy duty rolling walker with seat    dx CVA, respiratory failure with hypoxemia  Ask [employer] about a back-pack carrier for portable O2.  Also need to go over with [name] how to hook up self-fill O2 tanks properly.  Order- O2 qualifying walk test  dx Respiratory failure with hypoxemia  We can continue BIPAP 25/22

## 2021-04-28 ENCOUNTER — Telehealth: Payer: Self-pay | Admitting: Primary Care

## 2021-04-28 DIAGNOSIS — J9611 Chronic respiratory failure with hypoxia: Secondary | ICD-10-CM

## 2021-04-28 DIAGNOSIS — G4733 Obstructive sleep apnea (adult) (pediatric): Secondary | ICD-10-CM

## 2021-04-28 DIAGNOSIS — R0602 Shortness of breath: Secondary | ICD-10-CM

## 2021-04-28 NOTE — Telephone Encounter (Signed)
ONO on BIPAP 04/24/21 showed patient spent 3 hours 23 mins with SpO2 <88%. SpO2 low 63% and baseline 88%. He needs to wear 2.5L at night with his BIPAP. He should already have oxygen at home, unlikely he needs a new order except for O2 adapter/tubing for BIPAP.

## 2021-04-28 NOTE — Telephone Encounter (Signed)
Called and spoke with pt letting him know the results of ONO and stated to him that he needs to wear BIPAP with O2 at night based off of the results. Pt verbalized understanding. Order placed for O2 adapter/tubing for BIPAP so that way pt could be able to wear the BIPAP with O2 at night.nothing further needed.

## 2021-04-29 NOTE — Telephone Encounter (Signed)
Received the following message from patient:   "DR YOUNG, I ENJOYED OUR RECENT OFFICE VISIT REGARDING MY SHORTNESS OF BREATH.   WE BRIEFLY DISCUSSED MY HEART DOCTORS (DR [name]) MESSAGE FROM MY VISIT WITH HIM.   WE PUT OFF FURTHER CONVERSATION ON TWO ITEMS (PULMONARY HYPER TENSION AND ALSO SHUNTING) UNTIL AFTER THE OVERNIGHT PULSE OXIMETER TEST RESULTS WERE AVAILABLE. THE RESULTS ARE IN AND THEY ARE NOT WHAT I WOULD CALL POSITIVE.      AT THIS POINT I WOULD LIKE TO FINISH OUR DISCUSSION OF DR HILTYS COMMENTS.   ARE THERE TESTS TO DETECT THE TWO ITEMS HE MENTIONED?   GASPARE, NETZEL [facility]  29 April 2021"  Dr. Annamaria Boots, can you please advise? Thanks!

## 2021-05-01 ENCOUNTER — Encounter: Payer: Self-pay | Admitting: Internal Medicine

## 2021-05-01 NOTE — Assessment & Plan Note (Signed)
Supplemental oxygen 2 L/Adapt ordered per cardiology.  Order is appropriate the question is why he needs this.  Complex cardiopulmonary history including morbid obesity, recurrent PE, diastolic CHF and pulmonary hypertension. Cardiology had suggested exercise-induced pulmonary hypertension or shunting with consideration of bubble study or right heart cath.  I think both studies may be appropriate.  Meanwhile we will get adapt to work with him so he is comfortable self filling his portable tank.

## 2021-05-01 NOTE — Assessment & Plan Note (Signed)
BiPAP 25/22 seems appropriate but we need download which has been requested from his DME company. Plan-continue 25/22 for now, with supplemental oxygen added 2 L.

## 2021-05-02 ENCOUNTER — Telehealth: Payer: Self-pay | Admitting: Internal Medicine

## 2021-05-02 DIAGNOSIS — R0609 Other forms of dyspnea: Secondary | ICD-10-CM

## 2021-05-02 DIAGNOSIS — I272 Pulmonary hypertension, unspecified: Secondary | ICD-10-CM

## 2021-05-02 NOTE — Telephone Encounter (Signed)
-----   Message -----  From: Pixie Casino, MD  Sent: 05/02/2021  11:57 AM EST  To: Deneise Lever, MD, Fidel Levy, RN   Thanks for reviewing his case - we'll order a limited echo with bubble study and try to arrange for RHC (with exercise - shunt run) with Dr. Aundra Dubin or Bensimhon to evaluate for exercise induced pulmonary hypertension or ?why his sats drop significantly with ambulation, even on oxygen.   Thanks.   Mali [name]

## 2021-05-02 NOTE — Telephone Encounter (Signed)
Spoke with patient about recommendations from Dr. Debara Pickett   He agreed w/plan. He did ask about the echo he just had a few weeks ago -- was echo complete, no bubble study   Will need to reach out to AHF clinic team about MD availability for [place] per message from Santiago Glad with [facility] cath lab

## 2021-05-03 DIAGNOSIS — F32A Depression, unspecified: Secondary | ICD-10-CM

## 2021-05-03 DIAGNOSIS — Z7984 Long term (current) use of oral hypoglycemic drugs: Secondary | ICD-10-CM

## 2021-05-03 DIAGNOSIS — I4891 Unspecified atrial fibrillation: Secondary | ICD-10-CM

## 2021-05-03 DIAGNOSIS — I110 Hypertensive heart disease with heart failure: Secondary | ICD-10-CM

## 2021-05-03 DIAGNOSIS — I5030 Unspecified diastolic (congestive) heart failure: Secondary | ICD-10-CM

## 2021-05-03 DIAGNOSIS — E1159 Type 2 diabetes mellitus with other circulatory complications: Secondary | ICD-10-CM | POA: Diagnosis not present

## 2021-05-03 DIAGNOSIS — I2510 Atherosclerotic heart disease of native coronary artery without angina pectoris: Secondary | ICD-10-CM | POA: Diagnosis not present

## 2021-05-03 DIAGNOSIS — S63511A Sprain of carpal joint of right wrist, initial encounter: Secondary | ICD-10-CM | POA: Diagnosis not present

## 2021-05-03 DIAGNOSIS — E785 Hyperlipidemia, unspecified: Secondary | ICD-10-CM | POA: Diagnosis not present

## 2021-05-04 NOTE — Telephone Encounter (Signed)
°  [place] [address] Dept: [phone] Loc: [place]  05/04/2021  You are scheduled for a Cardiac Catheterization on Thursday, February 9 with Dr. Glori Bickers.  1. Please arrive at the [facility] (Main Entrance A) at [facility]: [address] [place], [place] [address] at 7:00 AM (This time is two hours before your procedure to ensure your preparation). Free valet parking service is available.   Special note: Every effort is made to have your procedure done on time. Please understand that emergencies sometimes delay scheduled procedures.  2. Diet: Do not eat solid foods after midnight.  The patient may have clear liquids until 5am upon the day of the procedure.  3. Labs: You will need to have blood drawn before 05/10/21 at Dr. Lysbeth Penner office   4. Medication instructions in preparation for your procedure:  Stop taking Eliquis (Apixiban) on Tuesday, February 7.  DO NOT TAKE torsemide the day of your procedure   On the morning of your procedure, take Aspirin 81mg  and any morning medicines NOT listed above.  You may use sips of water.  5. Plan for one night stay--bring personal belongings. 6. Bring a current list of your medications and current insurance cards. 7. You MUST have a responsible person to drive you home. 8. Someone MUST be with you the first 24 hours after you arrive home or your discharge will be delayed. 9. Please wear clothes that are easy to get on and off and wear slip-on shoes.  Thank you for allowing [place] to care for you!   -- [facility]

## 2021-05-04 NOTE — Telephone Encounter (Signed)
Spoke with Santiago Glad at [facility] cath lab  Scheduled RHC with Bensimhon, Shaune Pascal, MD on 05/12/21 @ 9am -- 7am arrival

## 2021-05-04 NOTE — Telephone Encounter (Signed)
Echo scheduled for 05/17/21

## 2021-05-06 ENCOUNTER — Other Ambulatory Visit: Payer: Self-pay | Admitting: Internal Medicine

## 2021-05-06 DIAGNOSIS — I272 Pulmonary hypertension, unspecified: Secondary | ICD-10-CM

## 2021-05-06 DIAGNOSIS — R0609 Other forms of dyspnea: Secondary | ICD-10-CM

## 2021-05-10 DIAGNOSIS — I272 Pulmonary hypertension, unspecified: Secondary | ICD-10-CM | POA: Diagnosis not present

## 2021-05-10 DIAGNOSIS — R0609 Other forms of dyspnea: Secondary | ICD-10-CM | POA: Diagnosis not present

## 2021-05-11 ENCOUNTER — Telehealth: Payer: Self-pay | Admitting: *Deleted

## 2021-05-11 LAB — BASIC METABOLIC PANEL
BUN/Creatinine Ratio: 29 — ABNORMAL HIGH (ref 10–24)
BUN: 28 mg/dL — ABNORMAL HIGH (ref 8–27)
CO2: 29 mmol/L (ref 20–29)
Calcium: 8.7 mg/dL (ref 8.6–10.2)
Chloride: 99 mmol/L (ref 96–106)
Creatinine, Ser: 0.97 mg/dL (ref 0.76–1.27)
Glucose: 108 mg/dL — ABNORMAL HIGH (ref 70–99)
Potassium: 3.8 mmol/L (ref 3.5–5.2)
Sodium: 144 mmol/L (ref 134–144)
eGFR: 81 mL/min/{1.73_m2} (ref >59)

## 2021-05-11 LAB — CBC
Hematocrit: 37.2 % — ABNORMAL LOW (ref 37.5–51.0)
Hemoglobin: 12.3 g/dL — ABNORMAL LOW (ref 13.0–17.7)
MCH: 28.5 pg (ref 26.6–33.0)
MCHC: 33.1 g/dL (ref 31.5–35.7)
MCV: 86 fL (ref 79–97)
Platelets: 221 10*3/uL (ref 150–450)
RBC: 4.32 x10E6/uL (ref 4.14–5.80)
RDW: 13.1 % (ref 11.6–15.4)
WBC: 7.9 10*3/uL (ref 3.4–10.8)

## 2021-05-11 NOTE — Telephone Encounter (Addendum)
Right Heart Cath scheduled at [facility] for: Thursday May 12, 2021 [facility] Main Entrance A [facility]) at: 7 AM   Diet-no solid food after midnight prior to cath, clear liquids until 5 AM day of procedure.  Medication instructions for procedure: -Hold:  Eliquis-none 05/10/21 until post procedure per instructions given patient 05/02/21  Torsemide/KCl-AM of procedure   Rybelsus-medication list indicates patient not currently taking  -Except hold medications usual morning medications can be taken pre-cath with sips of water.    Must have responsible adult to drive home post procedure and be with patient first 24 hours after arriving home.  [facility] does allow one visitor to accompany you and wait in the hospital waiting room while you are there for your procedure. You and your visitor will be asked to wear a mask once you enter the hospital.   Patient reports does not currently have any new symptoms concerning for COVID-19 and no household members with COVID-19 like illness.     Reviewed procedure instructions with patient.

## 2021-05-12 ENCOUNTER — Ambulatory Visit (HOSPITAL_COMMUNITY)
Admission: RE | Admit: 2021-05-12 | Discharge: 2021-05-12 | Disposition: A | Discharge status: Home / Self Care | Payer: Medicare Other | Attending: Internal Medicine | Admitting: Internal Medicine

## 2021-05-12 ENCOUNTER — Encounter (HOSPITAL_COMMUNITY)
Admission: RE | Disposition: A | Discharge status: Home / Self Care | Payer: Self-pay | Source: Home / Self Care | Attending: Internal Medicine

## 2021-05-12 ENCOUNTER — Other Ambulatory Visit: Payer: Self-pay

## 2021-05-12 DIAGNOSIS — G4733 Obstructive sleep apnea (adult) (pediatric): Secondary | ICD-10-CM | POA: Diagnosis not present

## 2021-05-12 DIAGNOSIS — I272 Pulmonary hypertension, unspecified: Secondary | ICD-10-CM

## 2021-05-12 DIAGNOSIS — I2699 Other pulmonary embolism without acute cor pulmonale: Secondary | ICD-10-CM | POA: Insufficient documentation

## 2021-05-12 DIAGNOSIS — Z8673 Personal history of transient ischemic attack (TIA), and cerebral infarction without residual deficits: Secondary | ICD-10-CM | POA: Diagnosis not present

## 2021-05-12 DIAGNOSIS — E785 Hyperlipidemia, unspecified: Secondary | ICD-10-CM | POA: Insufficient documentation

## 2021-05-12 DIAGNOSIS — I4510 Unspecified right bundle-branch block: Secondary | ICD-10-CM | POA: Diagnosis not present

## 2021-05-12 DIAGNOSIS — Z8679 Personal history of other diseases of the circulatory system: Secondary | ICD-10-CM | POA: Diagnosis not present

## 2021-05-12 DIAGNOSIS — Z6841 Body Mass Index (BMI) 40.0 and over, adult: Secondary | ICD-10-CM | POA: Diagnosis not present

## 2021-05-12 DIAGNOSIS — I110 Hypertensive heart disease with heart failure: Secondary | ICD-10-CM | POA: Insufficient documentation

## 2021-05-12 DIAGNOSIS — I480 Paroxysmal atrial fibrillation: Secondary | ICD-10-CM | POA: Insufficient documentation

## 2021-05-12 DIAGNOSIS — J3800 Paralysis of vocal cords and larynx, unspecified: Secondary | ICD-10-CM | POA: Insufficient documentation

## 2021-05-12 DIAGNOSIS — I2721 Secondary pulmonary arterial hypertension: Secondary | ICD-10-CM | POA: Insufficient documentation

## 2021-05-12 DIAGNOSIS — R0609 Other forms of dyspnea: Secondary | ICD-10-CM | POA: Insufficient documentation

## 2021-05-12 DIAGNOSIS — I509 Heart failure, unspecified: Secondary | ICD-10-CM | POA: Diagnosis not present

## 2021-05-12 HISTORY — PX: RIGHT AND LEFT HEART CATH: CATH118262

## 2021-05-12 LAB — POCT I-STAT EG7
Acid-Base Excess: 10 mmol/L — ABNORMAL HIGH (ref 0.0–2.0)
Acid-Base Excess: 10 mmol/L — ABNORMAL HIGH (ref 0.0–2.0)
Acid-Base Excess: 5 mmol/L — ABNORMAL HIGH (ref 0.0–2.0)
Acid-Base Excess: 6 mmol/L — ABNORMAL HIGH (ref 0.0–2.0)
Bicarbonate: 36.2 mmol/L — ABNORMAL HIGH (ref 20.0–28.0)
Bicarbonate: 36.3 mmol/L — ABNORMAL HIGH (ref 20.0–28.0)
Bicarbonate: 32.2 mmol/L — ABNORMAL HIGH (ref 20.0–28.0)
Bicarbonate: 32.8 mmol/L — ABNORMAL HIGH (ref 20.0–28.0)
Calcium, Ion: 1.1 mmol/L — ABNORMAL LOW (ref 1.15–1.40)
Calcium, Ion: 1.12 mmol/L — ABNORMAL LOW (ref 1.15–1.40)
Calcium, Ion: 0.92 mmol/L — ABNORMAL LOW (ref 1.15–1.40)
Calcium, Ion: 1.05 mmol/L — ABNORMAL LOW (ref 1.15–1.40)
HCT: 35 % — ABNORMAL LOW (ref 39.0–52.0)
HCT: 36 % — ABNORMAL LOW (ref 39.0–52.0)
HCT: 33 % — ABNORMAL LOW (ref 39.0–52.0)
HCT: 36 % — ABNORMAL LOW (ref 39.0–52.0)
Hemoglobin: 11.9 g/dL — ABNORMAL LOW (ref 13.0–17.0)
Hemoglobin: 12.2 g/dL — ABNORMAL LOW (ref 13.0–17.0)
Hemoglobin: 11.2 g/dL — ABNORMAL LOW (ref 13.0–17.0)
Hemoglobin: 12.2 g/dL — ABNORMAL LOW (ref 13.0–17.0)
O2 Saturation: 69 %
O2 Saturation: 71 %
O2 Saturation: 69 %
O2 Saturation: 84 %
Potassium: 3.2 mmol/L — ABNORMAL LOW (ref 3.5–5.1)
Potassium: 3.2 mmol/L — ABNORMAL LOW (ref 3.5–5.1)
Potassium: 2.8 mmol/L — ABNORMAL LOW (ref 3.5–5.1)
Potassium: 3.7 mmol/L (ref 3.5–5.1)
Sodium: 144 mmol/L (ref 135–145)
Sodium: 145 mmol/L (ref 135–145)
Sodium: 148 mmol/L — ABNORMAL HIGH (ref 135–145)
Sodium: 149 mmol/L — ABNORMAL HIGH (ref 135–145)
TCO2: 38 mmol/L — ABNORMAL HIGH (ref 22–32)
TCO2: 38 mmol/L — ABNORMAL HIGH (ref 22–32)
TCO2: 34 mmol/L — ABNORMAL HIGH (ref 22–32)
TCO2: 35 mmol/L — ABNORMAL HIGH (ref 22–32)
pCO2, Ven: 57 mmHg (ref 44.0–60.0)
pCO2, Ven: 57.4 mmHg (ref 44.0–60.0)
pCO2, Ven: 53.1 mmHg (ref 44.0–60.0)
pCO2, Ven: 66.6 mmHg — ABNORMAL HIGH (ref 44.0–60.0)
pH, Ven: 7.409 (ref 7.250–7.430)
pH, Ven: 7.411 (ref 7.250–7.430)
pH, Ven: 7.301 (ref 7.250–7.430)
pH, Ven: 7.391 (ref 7.250–7.430)
pO2, Ven: 37 mmHg (ref 32.0–45.0)
pO2, Ven: 38 mmHg (ref 32.0–45.0)
pO2, Ven: 37 mmHg (ref 32.0–45.0)
pO2, Ven: 55 mmHg — ABNORMAL HIGH (ref 32.0–45.0)

## 2021-05-12 LAB — POCT I-STAT 7, (LYTES, BLD GAS, ICA,H+H)
Acid-Base Excess: 8 mmol/L — ABNORMAL HIGH (ref 0.0–2.0)
Bicarbonate: 33.8 mmol/L — ABNORMAL HIGH (ref 20.0–28.0)
Calcium, Ion: 1.05 mmol/L — ABNORMAL LOW (ref 1.15–1.40)
HCT: 35 % — ABNORMAL LOW (ref 39.0–52.0)
Hemoglobin: 11.9 g/dL — ABNORMAL LOW (ref 13.0–17.0)
O2 Saturation: 96 %
Potassium: 3 mmol/L — ABNORMAL LOW (ref 3.5–5.1)
Sodium: 146 mmol/L — ABNORMAL HIGH (ref 135–145)
TCO2: 35 mmol/L — ABNORMAL HIGH (ref 22–32)
pCO2 arterial: 51.5 mmHg — ABNORMAL HIGH (ref 32.0–48.0)
pH, Arterial: 7.425 (ref 7.350–7.450)
pO2, Arterial: 83 mmHg (ref 83.0–108.0)

## 2021-05-12 SURGERY — RIGHT AND LEFT HEART CATH

## 2021-05-12 MED ORDER — SODIUM CHLORIDE 0.9% FLUSH: 3.0000 mL | INTRAVENOUS | Status: DC | PRN

## 2021-05-12 MED ORDER — HYDRALAZINE HCL 20 MG/ML IJ SOLN: 10.0000 mg | INTRAMUSCULAR | Status: DC | PRN

## 2021-05-12 MED ORDER — HEPARIN (PORCINE) IN NACL 1000-0.9 UT/500ML-% IV SOLN
INTRAVENOUS | Status: AC
  Filled 2021-05-12: qty 1000

## 2021-05-12 MED ORDER — ACETAMINOPHEN 325 MG PO TABS: 650.0000 mg | ORAL_TABLET | ORAL | Status: DC | PRN

## 2021-05-12 MED ORDER — HEPARIN (PORCINE) IN NACL 1000-0.9 UT/500ML-% IV SOLN
INTRAVENOUS | Status: DC | PRN
  Administered 2021-05-12 (×3): 500 mL

## 2021-05-12 MED ORDER — ONDANSETRON HCL 4 MG/2ML IJ SOLN: 4.0000 mg | Freq: Four times a day (QID) | INTRAMUSCULAR | Status: DC | PRN

## 2021-05-12 MED ORDER — VERAPAMIL HCL 2.5 MG/ML IV SOLN
INTRAVENOUS | Status: AC
  Filled 2021-05-12: qty 2

## 2021-05-12 MED ORDER — ASPIRIN 81 MG PO CHEW
81.0000 mg | CHEWABLE_TABLET | ORAL | Status: AC
  Administered 2021-05-12: 81 mg via ORAL
  Filled 2021-05-12: qty 1

## 2021-05-12 MED ORDER — VERAPAMIL HCL 2.5 MG/ML IV SOLN
INTRAVENOUS | Status: DC | PRN
  Administered 2021-05-12: 10 mL via INTRA_ARTERIAL

## 2021-05-12 MED ORDER — SODIUM CHLORIDE 0.9 % IV SOLN: 250.0000 mL | INTRAVENOUS | Status: DC | PRN

## 2021-05-12 MED ORDER — LABETALOL HCL 5 MG/ML IV SOLN: 10.0000 mg | INTRAVENOUS | Status: DC | PRN

## 2021-05-12 MED ORDER — HEPARIN SODIUM (PORCINE) 1000 UNIT/ML IJ SOLN
INTRAMUSCULAR | Status: DC | PRN
  Administered 2021-05-12: 5000 [IU] via INTRAVENOUS

## 2021-05-12 MED ORDER — LIDOCAINE HCL (PF) 1 % IJ SOLN
INTRAMUSCULAR | Status: AC
  Filled 2021-05-12: qty 30

## 2021-05-12 MED ORDER — SODIUM CHLORIDE 0.9 % IV SOLN: INTRAVENOUS | Status: DC

## 2021-05-12 MED ORDER — LIDOCAINE HCL (PF) 1 % IJ SOLN
INTRAMUSCULAR | Status: DC | PRN
  Administered 2021-05-12 (×2): 2 mL
  Administered 2021-05-12: 1 mL

## 2021-05-12 MED ORDER — SODIUM CHLORIDE 0.9% FLUSH: 3.0000 mL | Freq: Two times a day (BID) | INTRAVENOUS | Status: DC

## 2021-05-12 MED ORDER — HEPARIN SODIUM (PORCINE) 1000 UNIT/ML IJ SOLN
INTRAMUSCULAR | Status: AC
  Filled 2021-05-12: qty 10

## 2021-05-12 SURGICAL SUPPLY — 19 items
CATH 5FR JL3.5 JR4 ANG PIG MP (CATHETERS) ×1 IMPLANT
CATH INFINITI 5FR JR4 125CM (CATHETERS) ×1 IMPLANT
CATH SWAN GANZ 7F STRAIGHT (CATHETERS) ×1 IMPLANT
DEVICE RAD COMP TR BAND LRG (VASCULAR PRODUCTS) ×1 IMPLANT
GLIDESHEATH SLEND SS 6F .021 (SHEATH) ×1 IMPLANT
GLIDESHEATH SLENDER 7FR .021G (SHEATH) ×1 IMPLANT
GUIDEWIRE .025 260CM (WIRE) ×1 IMPLANT
GUIDEWIRE INQWIRE 1.5J.035X260 (WIRE) IMPLANT
INQWIRE 1.5J .035X260CM (WIRE) ×2
PACK CARDIAC CATHETERIZATION (CUSTOM PROCEDURE TRAY) ×2 IMPLANT
PROTECTION STATION PRESSURIZED (MISCELLANEOUS) ×2
SHEATH 6FR 85 DEST SLENDER (SHEATH) ×1 IMPLANT
SHEATH PINNACLE 6F 10CM (SHEATH) ×1 IMPLANT
SHEATH PROBE COVER 6X72 (BAG) ×1 IMPLANT
STATION PROTECTION PRESSURIZED (MISCELLANEOUS) IMPLANT
TRANSDUCER W/STOPCOCK (MISCELLANEOUS) ×3 IMPLANT
TUBING ART PRESS 72  MALE/FEM (TUBING) ×2
TUBING ART PRESS 72 MALE/FEM (TUBING) IMPLANT
WIRE MICRO SET SILHO 5FR 7 (SHEATH) ×1 IMPLANT

## 2021-05-12 NOTE — Interval H&P Note (Signed)
History and Physical Interval Note:  05/12/2021 8:15 AM  [name]  has presented today for surgery, with the diagnosis of dyspnea, exertional hypoxia  The various methods of treatment have been discussed with the patient and family. After consideration of risks, benefits and other options for treatment, the patient has consented to  Procedure(s): RIGHT HEART CATH (N/A) with exercise as a surgical intervention.  The patient's history has been reviewed, patient examined, no change in status, stable for surgery.  I have reviewed the patient's chart and labs.  Questions were answered to the patient's satisfaction.     [name]

## 2021-05-13 ENCOUNTER — Encounter (HOSPITAL_COMMUNITY): Payer: Self-pay | Admitting: Internal Medicine

## 2021-05-17 ENCOUNTER — Ambulatory Visit (HOSPITAL_COMMUNITY): Payer: Medicare Other | Attending: Internal Medicine

## 2021-05-17 ENCOUNTER — Other Ambulatory Visit: Payer: Self-pay

## 2021-05-17 DIAGNOSIS — R0609 Other forms of dyspnea: Secondary | ICD-10-CM | POA: Insufficient documentation

## 2021-05-17 DIAGNOSIS — I272 Pulmonary hypertension, unspecified: Secondary | ICD-10-CM | POA: Insufficient documentation

## 2021-05-17 LAB — ECHOCARDIOGRAM LIMITED BUBBLE STUDY
Area-P 1/2: 2.92 cm2
S' Lateral: 3.7 cm

## 2021-05-24 ENCOUNTER — Other Ambulatory Visit: Payer: Self-pay

## 2021-05-24 ENCOUNTER — Encounter: Payer: Self-pay | Admitting: Internal Medicine

## 2021-05-24 ENCOUNTER — Ambulatory Visit (INDEPENDENT_AMBULATORY_CARE_PROVIDER_SITE_OTHER): Payer: Medicare Other | Admitting: Internal Medicine

## 2021-05-24 ENCOUNTER — Other Ambulatory Visit: Payer: Self-pay | Admitting: Internal Medicine

## 2021-05-24 VITALS — BP 142/76 | HR 88 | Temp 98.4°F | Resp 16 | Ht 72.0 in | Wt 384.0 lb

## 2021-05-24 DIAGNOSIS — I5032 Chronic diastolic (congestive) heart failure: Secondary | ICD-10-CM | POA: Diagnosis not present

## 2021-05-24 DIAGNOSIS — D539 Nutritional anemia, unspecified: Secondary | ICD-10-CM | POA: Diagnosis not present

## 2021-05-24 DIAGNOSIS — I480 Paroxysmal atrial fibrillation: Secondary | ICD-10-CM | POA: Diagnosis not present

## 2021-05-24 DIAGNOSIS — G63 Polyneuropathy in diseases classified elsewhere: Secondary | ICD-10-CM

## 2021-05-24 DIAGNOSIS — E538 Deficiency of other specified B group vitamins: Secondary | ICD-10-CM

## 2021-05-24 DIAGNOSIS — I10 Essential (primary) hypertension: Secondary | ICD-10-CM | POA: Diagnosis not present

## 2021-05-24 DIAGNOSIS — E118 Type 2 diabetes mellitus with unspecified complications: Secondary | ICD-10-CM

## 2021-05-24 DIAGNOSIS — I272 Pulmonary hypertension, unspecified: Secondary | ICD-10-CM

## 2021-05-24 DIAGNOSIS — D538 Other specified nutritional anemias: Secondary | ICD-10-CM

## 2021-05-24 DIAGNOSIS — Z79899 Other long term (current) drug therapy: Secondary | ICD-10-CM

## 2021-05-24 LAB — IBC + FERRITIN
Ferritin: 112.3 ng/mL (ref 22.0–322.0)
Iron: 46 ug/dL (ref 42–165)
Saturation Ratios: 13.5 % — ABNORMAL LOW (ref 20.0–50.0)
TIBC: 341.6 ug/dL (ref 250.0–450.0)
Transferrin: 244 mg/dL (ref 212.0–360.0)

## 2021-05-24 LAB — FOLATE: Folate: >24.2 ng/mL (ref >5.9)

## 2021-05-24 MED ORDER — EMPAGLIFLOZIN 10 MG PO TABS: 10.0000 mg | ORAL_TABLET | Freq: Every day | ORAL | 3 refills | Status: DC

## 2021-05-24 MED ORDER — CYANOCOBALAMIN 1000 MCG/ML IJ SOLN
1000.0000 ug | Freq: Once | INTRAMUSCULAR | Status: AC
  Administered 2021-05-24: 1000 ug via INTRAMUSCULAR

## 2021-05-24 MED ORDER — APIXABAN 5 MG PO TABS: ORAL_TABLET | ORAL | 0 refills | Status: DC

## 2021-05-24 MED ORDER — RYBELSUS 3 MG PO TABS: 3.0000 mg | ORAL_TABLET | Freq: Every day | ORAL | 0 refills | Status: DC

## 2021-05-24 MED ORDER — METOPROLOL SUCCINATE ER 25 MG PO TB24: 25.0000 mg | ORAL_TABLET | Freq: Every day | ORAL | 3 refills | Status: DC

## 2021-05-24 NOTE — Telephone Encounter (Signed)
Per staff message communication from MD:   Follow-up med changes Received: Today [name], [name], MD  [name] Levy, RN [name] messaged me about follow-up of his cath. Dan had recommended some med changes. Would advise starting Toprol XL 25 mg daily, in addition to flecainide. Also, start Jardiance 10 mg daily (he can take with the Rebelsys). Needs repeat BMET in 2 weeks after starting that.   Thanks.   Mali

## 2021-05-24 NOTE — Patient Instructions (Signed)
Type 2 Diabetes Mellitus, Diagnosis, Adult ?Type 2 diabetes (type 2 diabetes mellitus) is a long-term, or chronic, disease. In type 2 diabetes, one or both of these problems may be present: ?The pancreas does not make enough of a hormone called insulin. ?Cells in the body do not respond properly to the insulin that the body makes (insulin resistance). ?Normally, insulin allows blood sugar (glucose) to enter cells in the body. The cells use glucose for energy. Insulin resistance or lack of insulin causes excess glucose to build up in the blood instead of going into cells. This causes high blood glucose (hyperglycemia).  ?What are the causes? ?The exact cause of type 2 diabetes is not known. ?What increases the risk? ?The following factors may make you more likely to develop this condition: ?Having a family member with type 2 diabetes. ?Being overweight or obese. ?Being inactive (sedentary). ?Having been diagnosed with insulin resistance. ?Having a history of prediabetes, diabetes when you were pregnant (gestational diabetes), or polycystic ovary syndrome (PCOS). ?What are the signs or symptoms? ?In the early stage of this condition, you may not have symptoms. Symptoms develop slowly and may include: ?Increased thirst or hunger. ?Increased urination. ?Unexplained weight loss. ?Tiredness (fatigue) or weakness. ?Vision changes, such as blurry vision. ?Dark patches on the skin. ?How is this diagnosed? ?This condition is diagnosed based on your symptoms, your medical history, a physical exam, and your blood glucose level. Your blood glucose may be checked with one or more of the following blood tests: ?A fasting blood glucose (FBG) test. You will not be allowed to eat (you will fast) for 8 hours or longer before a blood sample is taken. ?A random blood glucose test. This test checks blood glucose at any time of day regardless of when you ate. ?An A1C (hemoglobin A1C) blood test. This test provides information about blood  glucose levels over the previous 2-3 months. ?An oral glucose tolerance test (OGTT). This test measures your blood glucose at two times: ?After fasting. This is your baseline blood glucose level. ?Two hours after drinking a beverage that contains glucose. ?You may be diagnosed with type 2 diabetes if: ?Your fasting blood glucose level is 126 mg/dL (7.0 mmol/L) or higher. ?Your random blood glucose level is 200 mg/dL (11.1 mmol/L) or higher. ?Your A1C level is 6.5% or higher. ?Your oral glucose tolerance test result is higher than 200 mg/dL (11.1 mmol/L). ?These blood tests may be repeated to confirm your diagnosis. ?How is this treated? ?Your treatment may be managed by a specialist called an endocrinologist. Type 2 diabetes may be treated by following instructions from your health care provider about: ?Making dietary and lifestyle changes. These may include: ?Following a personalized nutrition plan that is developed by a registered dietitian. ?Exercising regularly. ?Finding ways to manage stress. ?Checking your blood glucose level as often as told. ?Taking diabetes medicines or insulin daily. This helps to keep your blood glucose levels in the healthy range. ?Taking medicines to help prevent complications from diabetes. Medicines may include: ?Aspirin. ?Medicine to lower cholesterol. ?Medicine to control blood pressure. ?Your health care provider will set treatment goals for you. Your goals will be based on your age, other medical conditions you have, and how you respond to diabetes treatment. Generally, the goal of treatment is to maintain the following blood glucose levels: ?Before meals: 80-130 mg/dL (4.4-7.2 mmol/L). ?After meals: below 180 mg/dL (10 mmol/L). ?A1C level: less than 7%. ?Follow these instructions at home: ?Questions to ask your health care provider ?  Consider asking the following questions: ?Should I meet with a certified diabetes care and education specialist? ?What diabetes medicines do I need,  and when should I take them? ?What equipment will I need to manage my diabetes at home? ?How often do I need to check my blood glucose? ?Where can I find a support group for people with diabetes? ?What number can I call if I have questions? ?When is my next appointment? ?General instructions ?Take over-the-counter and prescription medicines only as told by your health care provider. ?Keep all follow-up visits. This is important. ?Where to find more information ?For help and guidance and for more information about diabetes, please visit: ?American Diabetes Association (ADA): www.diabetes.org ?American Association of Diabetes Care and Education Specialists (ADCES): www.diabeteseducator.org ?International Diabetes Federation (IDF): www.idf.org ?Contact a health care provider if: ?Your blood glucose is at or above 240 mg/dL (13.3 mmol/L) for 2 days in a row. ?You have been sick or have had a fever for 2 days or longer, and you are not getting better. ?You have any of the following problems for more than 6 hours: ?You cannot eat or drink. ?You have nausea and vomiting. ?You have diarrhea. ?Get help right away if: ?You have severe hypoglycemia. This means your blood glucose is lower than 54 mg/dL (3.0 mmol/L). ?You become confused or you have trouble thinking clearly. ?You have difficulty breathing. ?You have moderate or large ketone levels in your urine. ?These symptoms may represent a serious problem that is an emergency. Do not wait to see if the symptoms will go away. Get medical help right away. Call your local emergency services (911 in the [place] Do not drive yourself to the hospital. ?Summary ?Type 2 diabetes mellitus is a long-term, or chronic, disease. In type 2 diabetes, the pancreas does not make enough of a hormone called insulin, or cells in the body do not respond properly to insulin that the body makes. ?This condition is treated by making dietary and lifestyle changes and taking diabetes medicines or  insulin. ?Your health care provider will set treatment goals for you. Your goals will be based on your age, other medical conditions you have, and how you respond to diabetes treatment. ?Keep all follow-up visits. This is important. ?This information is not intended to replace advice given to you by your health care provider. Make sure you discuss any questions you have with your health care provider. ?Document Revised: 06/14/2020 Document Reviewed: 06/14/2020 ?Elsevier Patient Education ? 2022 Elsevier Inc. ? ?

## 2021-05-24 NOTE — Progress Notes (Signed)
Subjective:  Patient ID: [name], male    DOB: 04/18/1945  Age: 76 y.o. MRN: [id number]  CC: Diabetes  This visit occurred during the SARS-CoV-2 public health emergency.  Safety protocols were in place, including screening questions prior to the visit, additional usage of staff PPE, and extensive cleaning of exam room while observing appropriate contact time as indicated for disinfecting solutions.    HPI RANSOME HELWIG presents for f/up -  He recently underwent a cardiac cath and was found to have diastolic dysfunction.  About 3 months ago I prescribed Rybelsus but he did not start taking it because he wanted to do some research.  Outpatient Medications Prior to Visit  Medication Sig Dispense Refill   atorvastatin (LIPITOR) 80 MG tablet TAKE 1 TABLET(80 MG) BY MOUTH DAILY 90 tablet 1   carboxymethylcellulose (REFRESH PLUS) 0.5 % SOLN Place 1 drop into both eyes 3 (three) times daily as needed (dry eyes).     [employer] MELATONIN GUMMIES PO Take 5 mg by mouth at bedtime.     Docusate Sodium (DSS) 100 MG CAPS Take 100 mg by mouth every other day.     finasteride (PROSCAR) 5 MG tablet Take 1 tablet (5 mg total) by mouth daily. 30 tablet 1   flecainide (TAMBOCOR) 50 MG tablet Take 1 tablet (50 mg total) by mouth every 12 (twelve) hours. 180 tablet 3   loratadine (CLARITIN) 10 MG tablet Take 10 mg by mouth daily.     LORazepam (ATIVAN) 0.5 MG tablet Take 1 tablet (0.5 mg total) by mouth at bedtime. 90 tablet 1   montelukast (SINGULAIR) 10 MG tablet TAKE 1 TABLET(10 MG) BY MOUTH AT BEDTIME 90 tablet 1   Multiple Vitamin (MULTIVITAMIN WITH MINERALS) TABS tablet Take 1 tablet by mouth daily.     pantoprazole (PROTONIX) 40 MG tablet TAKE 1 TABLET(40 MG) BY MOUTH DAILY 90 tablet 1   potassium chloride SA (KLOR-CON M) 20 MEQ tablet TAKE 1 TABLET BY MOUTH TWICE DAILY 180 tablet 3   rOPINIRole (REQUIP) 0.5 MG tablet Take 1 tablet (0.5 mg total) by mouth 3 (three) times daily. (Patient  taking differently: Take 1.5 mg by mouth at bedtime.) 270 tablet 2   sertraline (ZOLOFT) 100 MG tablet Take 1.5 tablets (150 mg total) by mouth at bedtime. 135 tablet 1   torsemide (DEMADEX) 20 MG tablet TAKE 3 TABLETS BY MOUTH ONCE DAILY. 270 tablet 3   traMADol (ULTRAM) 50 MG tablet TAKE 1 TABLET(50 MG) BY MOUTH EVERY 8 HOURS AS NEEDED 90 tablet 5   ELIQUIS 5 MG TABS tablet TAKE 1 TABLET(5 MG) BY MOUTH TWICE DAILY 180 tablet 0   No facility-administered medications prior to visit.    ROS Review of Systems  Constitutional:  Negative for diaphoresis and fatigue.  HENT: Negative.    Respiratory:  Negative for cough, chest tightness and shortness of breath.   Cardiovascular:  Positive for leg swelling. Negative for chest pain and palpitations.  Gastrointestinal:  Negative for abdominal pain, diarrhea and nausea.  Endocrine: Negative.   Genitourinary: Negative.  Negative for difficulty urinating.  Musculoskeletal: Negative.   Skin: Negative.   Neurological:  Negative for dizziness, weakness and light-headedness.  Hematological:  Negative for adenopathy. Does not bruise/bleed easily.  Psychiatric/Behavioral: Negative.     Objective:  BP (!) 142/76 (BP Location: Left Arm, Patient Position: Sitting, Cuff Size: Large)    Pulse 88    Temp 98.4 F (36.9 C) (Oral)  Resp 16    Ht 6' (1.829 m)    Wt (!) 384 lb (174.2 kg)    SpO2 96%    BMI 52.08 kg/m   BP Readings from Last 3 Encounters:  05/24/21 (!) 142/76  05/12/21 120/75  04/26/21 100/60    Wt Readings from Last 3 Encounters:  05/24/21 (!) 384 lb (174.2 kg)  05/12/21 (!) 384 lb (174.2 kg)  04/26/21 (!) 387 lb (175.5 kg)    Physical Exam Constitutional:      Appearance: He is obese.  HENT:     Mouth/Throat:     Mouth: Mucous membranes are moist.  Eyes:     General: No scleral icterus.    Conjunctiva/sclera: Conjunctivae normal.  Cardiovascular:     Rate and Rhythm: Normal rate and regular rhythm.     Pulses: Normal  pulses.     Heart sounds: No murmur heard. Pulmonary:     Effort: Pulmonary effort is normal.     Breath sounds: No stridor. No wheezing, rhonchi or rales.  Abdominal:     General: Abdomen is protuberant. Bowel sounds are normal. There is no distension.     Palpations: Abdomen is soft. There is no hepatomegaly, splenomegaly or mass.  Musculoskeletal:     Cervical back: Neck supple.     Right lower leg: 1+ Pitting Edema present.     Left lower leg: 1+ Pitting Edema present.  Lymphadenopathy:     Cervical: No cervical adenopathy.  Skin:    General: Skin is warm.     Findings: Rash present.  Neurological:     General: No focal deficit present.     Mental Status: He is alert.    Lab Results  Component Value Date   WBC 7.9 05/10/2021   HGB 12.2 (L) 05/12/2021   HCT 36.0 (L) 05/12/2021   PLT 221 05/10/2021   GLUCOSE 108 (H) 05/10/2021   CHOL 149 11/09/2020   TRIG 139.0 11/09/2020   HDL 44.20 11/09/2020   LDLDIRECT 91.0 05/16/2018   LDLCALC 77 11/09/2020   ALT 17 11/02/2020   AST 26 11/02/2020   NA 148 (H) 05/12/2021   K 3.7 05/12/2021   CL 99 05/10/2021   CREATININE 0.97 05/10/2021   BUN 28 (H) 05/10/2021   CO2 29 05/10/2021   TSH 2.43 11/09/2020   PSA 0.47 11/09/2020   INR 1.6 (H) 12/30/2020   HGBA1C 6.5 11/09/2020   MICROALBUR <0.7 11/09/2020    CARDIAC CATHETERIZATION  Result Date: 05/12/2021 Findings: Resting numbers on 2L O2 Ao = 127/60 (85) LV = 114/15 RA =  11 RV = 46/16 PA = 45/18 (32) PCW = 18 Fick cardiac output/index = 9.2/3.3 PVR = 1.6 WU ABG =  7.4/52/83/96% PA sat = 69%, 70% SVC sat = 69% Unloaded cycling PA = 74/39 (43) PCWP = 40 Thermo CO/CI = 12.5/4.5 Sat 95% 10W cycling PA = 84/37 (52) PCWP = 44 (v waves not prominent) Thermo CO/CI = 11.5/4.1 Sat 81% ABG at peak 7.30/67/55/84% Assessment: 1. Very mild pulmonary HTN at rest with high cardiac output 2. Severe pulmonary HTN with with exertional desaturations at low-level exertion due to marked elevation in  PCWP 3. No evidence of L to T intracardiac shunting 4. 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 Recommend: BP control, trial of SGLT2i and weight loss. Glori Bickers, MD 12:01 PM  Assessment & Plan:   Horrace was seen today for diabetes.  Diagnoses and all orders for this visit:  Benign essential HTN- BP is adequately well controlled.  PAF (paroxysmal atrial fibrillation) ([place]osa)- He has good rate/rhythm control. -     apixaban (ELIQUIS) 5 MG TABS tablet; TAKE 1 TABLET(5 MG) BY MOUTH TWICE DAILY  Type II diabetes mellitus with manifestations ([place])-  He is not willing to take rybelsus. -     Discontinue: Semaglutide (RYBELSUS) 3 MG TABS; Take 3 mg by mouth daily. -     Hemoglobin A1c; Future -     empagliflozin (JARDIANCE) 10 MG TABS tablet; Take 1 tablet (10 mg total) by mouth daily before breakfast.  B12 deficiency -     Folate; Future -     cyanocobalamin ((VITAMIN B-12)) injection 1,000 mcg -     Folate  Polyneuropathy associated with underlying disease (HCC)  Deficiency anemia -     IBC + Ferritin; Future -     Folate; Future -     Zinc; Future -     Vitamin B1; Future -     Vitamin B1 -     Zinc -     Folate -     IBC + Ferritin  Chronic heart failure with preserved ejection fraction (HCC) -     empagliflozin (JARDIANCE) 10 MG TABS tablet; Take 1 tablet (10 mg total) by mouth daily before breakfast.  Anemia due to zinc deficiency -     zinc gluconate 50 MG tablet; Take 1 tablet (50 mg total) by mouth daily.   I have discontinued Doren Custard A. Boese "[name]"'s Rybelsus and empagliflozin. I have also changed his Eliquis to apixaban. Additionally, I am having him start on empagliflozin and zinc gluconate. Lastly, I am having him maintain his DSS, loratadine, finasteride, multivitamin with  minerals, [employer]S MELATONIN GUMMIES PO, torsemide, flecainide, rOPINIRole, traMADol, sertraline, LORazepam, atorvastatin, montelukast, pantoprazole, potassium chloride SA, and carboxymethylcellulose. We administered cyanocobalamin.  Meds ordered this encounter  Medications   apixaban (ELIQUIS) 5 MG TABS tablet    Sig: TAKE 1 TABLET(5 MG) BY MOUTH TWICE DAILY    Dispense:  180 tablet    Refill:  0   DISCONTD: Semaglutide (RYBELSUS) 3 MG TABS    Sig: Take 3 mg by mouth daily.    Dispense:  30 tablet    Refill:  0   cyanocobalamin ((VITAMIN B-12)) injection 1,000 mcg   empagliflozin (JARDIANCE) 10 MG TABS tablet    Sig: Take 1 tablet (10 mg total) by mouth daily before breakfast.    Dispense:  90 tablet    Refill:  1   zinc gluconate 50 MG tablet    Sig: Take 1 tablet (50 mg total) by mouth daily.    Dispense:  90 tablet    Refill:  1     Follow-up: Return in about 3 months (around 08/21/2021).  [name], MD

## 2021-05-25 ENCOUNTER — Telehealth: Payer: Self-pay

## 2021-05-25 ENCOUNTER — Telehealth: Payer: Self-pay | Admitting: Primary Care

## 2021-05-25 NOTE — Telephone Encounter (Signed)
Can we follow up and patient sure patient is wearing oxygen with bipap at night. Make sure he got adapter and tubing

## 2021-05-25 NOTE — Telephone Encounter (Signed)
**Note De-Identified  Obfuscation** I started a Jardiance PA through covermymeds but received a message that the pts Jardiance does not require a PA.  I called [facility] pharmacy and was advised that [name] is covered by the pts plan without a PA but that the pt has a deductible ($505.00) that has not been met for this year.

## 2021-05-25 NOTE — Telephone Encounter (Signed)
MyChart message sent to patient w/update

## 2021-05-25 NOTE — Telephone Encounter (Signed)
Called patient and relayed Dr. Ronnald Ramp message. Patient voiced understanding and stated he will take the metoprolol succinate and jardiance.

## 2021-05-25 NOTE — Telephone Encounter (Deleted)
**Note De-identified  Obfuscation** -----  **Note De-Identified  Obfuscation** Message from Fidel Levy, RN sent at 05/24/2021  2:51 PM EST ----- Regarding: jardiance PA   Patient will need PA for Jardiance 10mg  Dx: diastolic heart failure, pulmonary hypertension

## 2021-05-25 NOTE — Telephone Encounter (Signed)
Patient states his cardiologist, Dr. Debara Pickett, would suggest metoprolol succinate and jardiance in place of the Rybelsus. Patient would like to know what you advise before picking up the Rybelsus from the pharmacy.

## 2021-05-25 NOTE — Telephone Encounter (Signed)
**Note De-identified  Obfuscation** -----  **Note De-Identified  Obfuscation** Message from Fidel Levy, RN sent at 05/24/2021  2:51 PM EST ----- Regarding: jardiance PA     Patient will need PA for Jardiance 10mg  Dx: diastolic heart failure, pulmonary hypertension

## 2021-05-26 ENCOUNTER — Telehealth: Payer: Self-pay

## 2021-05-26 NOTE — Telephone Encounter (Signed)
-----   Message from Janith Lima, MD sent at 05/25/2021  7:59 AM EST ----- Regarding: rybelsus Is there any financial support for Rybelsus for him?  TJ

## 2021-05-26 NOTE — Telephone Encounter (Signed)
Spoke with patient, started [employer] PAP for eliquis and jardiance   Forms mailed to patient, will wait until patient portion is received back in office and submit to manufacturer

## 2021-05-26 NOTE — Telephone Encounter (Signed)
Called and spoke with pt in regards to the message received from [place]. Pt said that he does now have the adapter and the correct tubing that is needed to use to be able to blend O2 in with BIPAP.  Pt said that he had been using the O2 with the BIPAP at night.

## 2021-05-28 DIAGNOSIS — I5032 Chronic diastolic (congestive) heart failure: Secondary | ICD-10-CM | POA: Insufficient documentation

## 2021-05-28 MED ORDER — EMPAGLIFLOZIN 10 MG PO TABS: 10.0000 mg | ORAL_TABLET | Freq: Every day | ORAL | 1 refills | Status: DC

## 2021-05-29 ENCOUNTER — Other Ambulatory Visit: Payer: Self-pay | Admitting: Internal Medicine

## 2021-05-29 DIAGNOSIS — D538 Other specified nutritional anemias: Secondary | ICD-10-CM | POA: Insufficient documentation

## 2021-05-29 LAB — ZINC: Zinc: 52 ug/dL — ABNORMAL LOW (ref 60–130)

## 2021-05-29 LAB — VITAMIN B1: Vitamin B1 (Thiamine): 16 nmol/L (ref 8–30)

## 2021-05-29 MED ORDER — ZINC GLUCONATE 50 MG PO TABS
50.0000 mg | ORAL_TABLET | Freq: Every day | ORAL | 1 refills | Status: DC
Start: 2021-05-29 — End: 2022-10-10

## 2021-06-02 ENCOUNTER — Telehealth: Payer: Self-pay

## 2021-06-02 NOTE — Progress Notes (Signed)
? ? ?Chronic Care Management ?Pharmacy Assistant  ? ?Name: [name]  MRN: [id number] DOB: Sep 28, 1945 ? ?[name] is an 76 y.o. year old male who presents for his follow-up CCM visit with the clinical pharmacist. ? ?Reason for Encounter: Disease State-General ?  ? ?Recent office visits:  ?05/24/21 Janith Lima, MD-PCP (Benign essential HTN) Medication changes: discontinued Rybelsus, start Eliquis 5 mg BID, Jardiance 10 mg, Zinc 50 mg ? ?Recent consult visits:  ?04/26/21 Baird Lyons D, MD-Pulmonary Disease (SOB) AMB Referral for DME, no med changes ? ?04/21/21 HiltyNadean Corwin, MD-Cardiology (SOB) No orders or med changes ? ?Hospital visits:  ?Medication Reconciliation was completed by comparing discharge summary, patient?s EMR and Pharmacy list, and upon discussion with patient. ? ?Admitted to the hospital on 05/12/21 due to SOB. Discharge date was 05/12/21. Discharged from [facility]   ? ?Medications that remain the same after Hospital Discharge:??  ?-All other medications will remain the same.   ? ?Medications: ?Outpatient Encounter Medications as of 06/02/2021  ?Medication Sig  ? apixaban (ELIQUIS) 5 MG TABS tablet TAKE 1 TABLET(5 MG) BY MOUTH TWICE DAILY  ? atorvastatin (LIPITOR) 80 MG tablet TAKE 1 TABLET(80 MG) BY MOUTH DAILY  ? carboxymethylcellulose (REFRESH PLUS) 0.5 % SOLN Place 1 drop into both eyes 3 (three) times daily as needed (dry eyes).  ? [facility]S MELATONIN GUMMIES PO Take 5 mg by mouth at bedtime.  ? Docusate Sodium (DSS) 100 MG CAPS Take 100 mg by mouth every other day.  ? empagliflozin (JARDIANCE) 10 MG TABS tablet Take 1 tablet (10 mg total) by mouth daily before breakfast.  ? finasteride (PROSCAR) 5 MG tablet Take 1 tablet (5 mg total) by mouth daily.  ? flecainide (TAMBOCOR) 50 MG tablet Take 1 tablet (50 mg total) by mouth every 12 (twelve) hours.  ? loratadine (CLARITIN) 10 MG tablet Take 10 mg by mouth daily.  ? LORazepam (ATIVAN) 0.5 MG tablet Take 1 tablet (0.5 mg  total) by mouth at bedtime.  ? metoprolol succinate (TOPROL-XL) 25 MG 24 hr tablet Take 1 tablet (25 mg total) by mouth daily.  ? montelukast (SINGULAIR) 10 MG tablet TAKE 1 TABLET(10 MG) BY MOUTH AT BEDTIME  ? Multiple Vitamin (MULTIVITAMIN WITH MINERALS) TABS tablet Take 1 tablet by mouth daily.  ? pantoprazole (PROTONIX) 40 MG tablet TAKE 1 TABLET(40 MG) BY MOUTH DAILY  ? potassium chloride SA (KLOR-CON M) 20 MEQ tablet TAKE 1 TABLET BY MOUTH TWICE DAILY  ? rOPINIRole (REQUIP) 0.5 MG tablet Take 1 tablet (0.5 mg total) by mouth 3 (three) times daily. (Patient taking differently: Take 1.5 mg by mouth at bedtime.)  ? sertraline (ZOLOFT) 100 MG tablet Take 1.5 tablets (150 mg total) by mouth at bedtime.  ? torsemide (DEMADEX) 20 MG tablet TAKE 3 TABLETS BY MOUTH ONCE DAILY.  ? traMADol (ULTRAM) 50 MG tablet TAKE 1 TABLET(50 MG) BY MOUTH EVERY 8 HOURS AS NEEDED  ? zinc gluconate 50 MG tablet Take 1 tablet (50 mg total) by mouth daily.  ? ?No facility-administered encounter medications on file as of 06/02/2021.  ? ?Have you had any problems recently with your health?Patient states that he is doing well and has started taking zinc and is also taking calcium every other day ? ?Have you had any problems with your pharmacy?Patient states that he is not having any problems with getting medications or the cost of medications from the pharmacy ? ?What issues or side effects are you having with your  medications?Patient states that he is not having any side effects from medications ? ?What would you like me to pass along to [facility] for them to help you with? Patient states that he is waiting for application for Jardiance from [employer] have not received them in the mail ? ?What can we do to take care of you better? Patient states that he would like to know if he is suppose to be getting monthly shots of B-12. He stated Dr. Ronnald Ramp said that he needed because he was anemic but not sure if he is to get them monthly ? ?Care  Gaps: ?Colonoscopy-07/31/13 ?Diabetic Foot Exam-11/09/20 ?Ophthalmology-01/05/21 ?Dexa Scan - NA ?Annual Well Visit - NA ?Micro albumin-11/09/20 ?Hemoglobin A1c- 11/09/20 ?  ?Star Rating Drugs: ?Atorvastatin 80 mg-last fill 05/30/21 90 ds ? ? ?Ethelene Hal ?Clinical Pharmacist Assistant ?[phone]  ?

## 2021-06-03 NOTE — Progress Notes (Signed)
? ?  Made call to patient this morning stated he would call Dr. Ronnald Ramp office to set up B-12 shots. Also patient states that he may have received patient assistance application in the mail. ? ?Ethelene Hal ?Clinical Pharmacist Assistant ?[phone]  ?

## 2021-06-07 ENCOUNTER — Encounter: Payer: Medicare Other | Attending: Physical Medicine & Rehabilitation | Admitting: Physical Medicine & Rehabilitation

## 2021-06-07 ENCOUNTER — Encounter: Payer: Self-pay | Admitting: Physical Medicine & Rehabilitation

## 2021-06-07 ENCOUNTER — Other Ambulatory Visit: Payer: Self-pay

## 2021-06-07 VITALS — BP 140/69 | HR 65

## 2021-06-07 DIAGNOSIS — G894 Chronic pain syndrome: Secondary | ICD-10-CM | POA: Diagnosis not present

## 2021-06-07 DIAGNOSIS — Z5181 Encounter for therapeutic drug level monitoring: Secondary | ICD-10-CM | POA: Diagnosis not present

## 2021-06-07 DIAGNOSIS — I69398 Other sequelae of cerebral infarction: Secondary | ICD-10-CM | POA: Diagnosis not present

## 2021-06-07 DIAGNOSIS — Z79891 Long term (current) use of opiate analgesic: Secondary | ICD-10-CM | POA: Diagnosis not present

## 2021-06-07 DIAGNOSIS — G463 Brain stem stroke syndrome: Secondary | ICD-10-CM | POA: Diagnosis not present

## 2021-06-07 DIAGNOSIS — R269 Unspecified abnormalities of gait and mobility: Secondary | ICD-10-CM

## 2021-06-07 DIAGNOSIS — M48061 Spinal stenosis, lumbar region without neurogenic claudication: Secondary | ICD-10-CM

## 2021-06-07 DIAGNOSIS — M47817 Spondylosis without myelopathy or radiculopathy, lumbosacral region: Secondary | ICD-10-CM | POA: Diagnosis not present

## 2021-06-07 MED ORDER — TRAMADOL HCL 50 MG PO TABS
ORAL_TABLET | ORAL | 5 refills | Status: DC
Start: 2021-06-07 — End: 2021-08-03

## 2021-06-07 NOTE — Progress Notes (Signed)
Subjective:    Patient ID: [name], male    DOB: 1945/08/29, 76 y.o.   MRN: [id number]  HPI  Low back is doing ok, has bilateral knee pain Left >R No sciatic pain Heart cath Dr Sung Amabile Pulmonary artery hypertension diagnosed, advised to lose weight  Also sees Dr Annamaria Boots  Pulmonary  Taking tramadol '50mg'$  2-3 x per day   MRI LUMBAR SPINE WITHOUT CONTRAST   TECHNIQUE: Multiplanar, multisequence MR imaging of the lumbar spine was performed. No intravenous contrast was administered.   COMPARISON:  Lumbar radiographs 11/07/2016.   FINDINGS: Segmentation:  Normal on the comparison radiographs.   Alignment: Mild retrolisthesis of L4 on L5 measures up to 5 millimeters and is new from the prior radiographs. Stable straightening of lumbar lordosis otherwise.   Vertebrae: Benign vertebral body hemangioma in L4. No marrow edema or evidence of acute osseous abnormality. Normal background bone marrow signal. Intact visible sacrum and SI joints.   Conus medullaris and cauda equina: Conus extends to the L1 level. No lower spinal cord or conus signal abnormality. Lipoma of the filum terminalis, normal variant (series 9, image 11).   Paraspinal and other soft tissues: Partially visible benign appearing renal cysts. Negative visible other abdominal viscera. Negative visualized posterior paraspinal soft tissues.   Disc levels:   T11-T12: Partially visible, negative.   T12-L1:  Minimal disc bulge.   L1-L2:  Negative.   L2-L3:  Minimal disc bulge.  Mild facet hypertrophy.  No stenosis.   L3-L4: Mostly far lateral mild disc bulging with endplate spurring. Mild to moderate facet hypertrophy. No spinal or lateral recess stenosis. Borderline to mild bilateral L3 foraminal stenosis.   L4-L5: Chronic disc space loss. Retrolisthesis with circumferential disc osteophyte complex. Mild to moderate facet hypertrophy. No significant spinal stenosis. Mild bilateral lateral recess  stenosis (descending L5 nerve levels). Superimposed left foraminal to far lateral disc protrusion (series 5, image 14). Moderate to severe left and mild to moderate right L4 foraminal stenosis.   L5-S1: Negative disc. Mild to moderate facet hypertrophy. No stenosis.   IMPRESSION: 1. L4-L5 advanced disc and endplate degeneration, and moderate posterior element degeneration where mild retrolisthesis appears increased since 2018. Moderate to severe L4 foraminal stenosis is greater on the left. Mild bilateral L5 nerve level lateral recess stenosis. 2. Mild for age lumbar spine degeneration elsewhere with no other convincing neural impingement.     Electronically Signed   By: Genevie Ann M.D.   On: 10/14/2018 03:29 Pain Inventory Average Pain 3 Pain Right Now 3 My pain is intermittent and dull  In the last 24 hours, has pain interfered with the following? General activity 2 Relation with others 2 Enjoyment of life 2 What TIME of day is your pain at its worst? varies Sleep (in general) Fair  Pain is worse with: walking and standing Pain improves with: medication Relief from Meds: 7  Family History  Problem Relation Age of Onset   Depression Mother    Hypertension Mother    Dementia Mother    Heart disease Father        MI   Depression Brother    Stroke Paternal Aunt        CVA   Diabetes Paternal Uncle    Heart disease Paternal Uncle        MI   Heart disease Paternal Grandmother        MI   Diabetes Cousin        PATERNAL   Social  History   Socioeconomic History   Marital status: Married    Spouse name: Not on file   Number of children: 1   Years of education: Not on file   Highest education level: Not on file  Occupational History   Occupation: retired-Manager [employer]  Tobacco Use   Smoking status: Former    Packs/day: 0.20    Years: 13.00    Pack years: 2.60    Types: Cigarettes    Start date: 04/03/1961    Quit date: 08/30/1974    Years since  quitting: 46.8   Smokeless tobacco: Never  Vaping Use   Vaping Use: Never used  Substance and Sexual Activity   Alcohol use: No    Alcohol/week: 0.0 standard drinks   Drug use: No   Sexual activity: Not Currently  Other Topics Concern   Not on file  Social History Narrative   Lives at home w/ his wife   Right-handed   Caffeine: decaf coffee, occasional tea   Social Determinants of Radio broadcast assistant Strain: Low Risk    Difficulty of Paying Living Expenses: Not hard at all  Food Insecurity: No Food Insecurity   Worried About Charity fundraiser in the Last Year: Never true   [place] in the Last Year: Never true  Transportation Needs: No Transportation Needs   Lack of Transportation (Medical): No   Lack of Transportation (Non-Medical): No  Physical Activity: Inactive   Days of Exercise per Week: 0 days   Minutes of Exercise per Session: 0 min  Stress: No Stress Concern Present   Feeling of Stress : Not at all  Social Connections: Socially Integrated   Frequency of Communication with Friends and Family: More than three times a week   Frequency of Social Gatherings with Friends and Family: More than three times a week   Attends Religious Services: 1 to 4 times per year   Active Member of Clubs or Organizations: Yes   Attends Archivist Meetings: 1 to 4 times per year   Marital Status: Married   Past Surgical History:  Procedure Laterality Date   ARTHROSCOPY KNEE W/ DRILLING     BREATH TEK H PYLORI N/A 12/23/2012   Procedure: BREATH TEK H PYLORI;  Surgeon: Gayland Curry, MD;  Location: [facility];  Service: General;  Laterality: N/A;   [place] N/A 07/31/2013   Procedure: COLONOSCOPY;  Surgeon: Gatha Mayer, MD;  Location: [place];  Service: Endoscopy;  Laterality: N/A;   ESOPHAGOGASTRODUODENOSCOPY N/A 07/22/2013   Procedure: ESOPHAGOGASTRODUODENOSCOPY (EGD);  Surgeon: Irene Shipper, MD;  [facility];   Service: Endoscopy;  Laterality: N/A;   ESOPHAGOGASTRODUODENOSCOPY N/A 07/31/2013   Procedure: ESOPHAGOGASTRODUODENOSCOPY (EGD);  Surgeon: Gatha Mayer, MD;  Location: [facility] ENDOSCOPY;  Service: Endoscopy;  Laterality: N/A;   INTRAOPERATIVE TRANSESOPHAGEAL ECHOCARDIOGRAM N/A 07/18/2013   Procedure: INTRAOPERATIVE TRANSESOPHAGEAL ECHOCARDIOGRAM;  Surgeon: Grace Isaac, MD;  Location: [place];  Service: Open Heart Surgery;  Laterality: N/A;   LAPAROSCOPIC GASTRIC BANDING WITH HIATAL HERNIA REPAIR N/A 04/08/2013   Procedure: LAPAROSCOPIC GASTRIC BANDING WITH possible  HIATAL HERNIA REPAIR;  Surgeon: Gayland Curry, MD;  Location: [facility];  Service: General;  Laterality: N/A;   LITHOTRIPSY     renal calculi     RIGHT AND LEFT HEART CATH N/A 05/12/2021   Procedure: RIGHT AND LEFT HEART CATH;  Surgeon: Jolaine Artist, MD;  Location: [place] CV LAB;  Service: Cardiovascular;  Laterality: N/A;   SUBXYPHOID PERICARDIAL WINDOW N/A 07/18/2013   Procedure: SUBXYPHOID PERICARDIAL WINDOW;  Surgeon: Grace Isaac, MD;  Location: [place];  Service: Thoracic;  Laterality: N/A;   TONSILLECTOMY     total knee replacment     bilat   trigger finger repair     also lue, cts surgically repaired   WISDOM TOOTH EXTRACTION     Past Surgical History:  Procedure Laterality Date   ARTHROSCOPY KNEE W/ DRILLING     BREATH TEK H PYLORI N/A 12/23/2012   Procedure: BREATH TEK Kandis Ban;  Surgeon: Gayland Curry, MD;  Location: [facility];  Service: General;  Laterality: N/A;   CHOLECYSTECTOMY  1989   COLONOSCOPY N/A 07/31/2013   Procedure: COLONOSCOPY;  Surgeon: Gatha Mayer, MD;  Location: [place];  Service: Endoscopy;  Laterality: N/A;   ESOPHAGOGASTRODUODENOSCOPY N/A 07/22/2013   Procedure: ESOPHAGOGASTRODUODENOSCOPY (EGD);  Surgeon: Irene Shipper, MD;  Location: [facility] ENDOSCOPY;  Service: Endoscopy;  Laterality: N/A;   ESOPHAGOGASTRODUODENOSCOPY N/A 07/31/2013   Procedure: ESOPHAGOGASTRODUODENOSCOPY (EGD);   Surgeon: Gatha Mayer, MD;  Location: [facility] ENDOSCOPY;  Service: Endoscopy;  Laterality: N/A;   INTRAOPERATIVE TRANSESOPHAGEAL ECHOCARDIOGRAM N/A 07/18/2013   Procedure: INTRAOPERATIVE TRANSESOPHAGEAL ECHOCARDIOGRAM;  Surgeon: Grace Isaac, MD;  Location: [place];  Service: Open Heart Surgery;  Laterality: N/A;   LAPAROSCOPIC GASTRIC BANDING WITH HIATAL HERNIA REPAIR N/A 04/08/2013   Procedure: LAPAROSCOPIC GASTRIC BANDING WITH possible  HIATAL HERNIA REPAIR;  Surgeon: Gayland Curry, MD;  Location: [facility];  Service: General;  Laterality: N/A;   LITHOTRIPSY     renal calculi     RIGHT AND LEFT HEART CATH N/A 05/12/2021   Procedure: RIGHT AND LEFT HEART CATH;  Surgeon: Jolaine Artist, MD;  Location: [place] CV LAB;  Service: Cardiovascular;  Laterality: N/A;   SUBXYPHOID PERICARDIAL WINDOW N/A 07/18/2013   Procedure: SUBXYPHOID PERICARDIAL WINDOW;  Surgeon: Grace Isaac, MD;  Location: [facility];  Service: Thoracic;  Laterality: N/A;   TONSILLECTOMY     total knee replacment     bilat   trigger finger repair     also lue, cts surgically repaired   WISDOM TOOTH EXTRACTION     Past Medical History:  Diagnosis Date   Anemia 07/29/2013   Asthma    childhood asthma   Benign essential HTN    Benign prostatic hypertrophy    several prostate biopsies neg for cancer.    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   FH: colonic polyps 2010   Gout    History of kidney stones    Hypertension    Kidney cysts    Morbid obesity (HCC)    Numbness    feet / legs   PAF (paroxysmal atrial fibrillation) with RVR 07/29/2013   Pericarditis 2015   s/p window   Recurrent  pulmonary emboli (HCC) 06/13/2015   Sleep apnea    USES C PAP   Stroke syndrome    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   BP 140/69    Pulse 65    SpO2 90%   Opioid Risk Score:   Fall Risk Score:  `  1  Depression screen PHQ 2/9  Depression screen [facility] 2/9 05/24/2021 12/16/2020 12/07/2020 11/09/2020 05/12/2020 10/27/2019 10/27/2019  Decreased Interest 1 0 1 0 0 0 0  Down, Depressed, Hopeless 1 0 1 0 0 0 0  PHQ - 2 Score 2 0 2 0 0 0 0  Altered sleeping 0 - - 0 0 - -  Tired, decreased energy 0 - - 0 0 - -  Change in appetite 0 - - 0 0 - -  Feeling bad or failure about yourself  0 - - 0 0 - -  Trouble concentrating 0 - - 0 0 - -  Moving slowly or fidgety/restless 0 - - 0 0 - -  Suicidal thoughts 0 - - 0 0 - -  PHQ-9 Score 2 - - 0 0 - -  Difficult doing work/chores - - - - - - -  Some recent data might be hidden     Review of Systems  Respiratory:  Positive for shortness of breath and wheezing.   Musculoskeletal:        Pain in outside of left knee  Neurological:  Positive for weakness.  All other systems reviewed and are negative.     Objective:   Physical Exam Vitals and nursing note reviewed.  Constitutional:      Appearance: He is obese.  HENT:     Head: Normocephalic and atraumatic.  Eyes:     Extraocular Movements: Extraocular movements intact.     Conjunctiva/sclera: Conjunctivae normal.     Pupils: Pupils are equal, round, and reactive to light.  Musculoskeletal:     Right lower leg: Edema present.     Left lower leg: Edema present.     Comments: Left lateral joint line tenderness  Skin:    General: Skin is warm and dry.  Neurological:     Mental Status: He is alert and oriented to person, place, and time.     Motor: No abnormal muscle tone.     Coordination: Romberg sign positive.     Gait: Gait abnormal.     Comments: 5/5 in BLE Neg SLR bilaterally  Psychiatric:        Mood and Affect: Mood normal.        Behavior: Behavior normal.          Assessment & Plan:    Lumbar stenosis without signs of radiculopathy or myelopathy.  No neurogenic claudication but is mainly in power chair with very poor exercise tolerance due to pulm artery hypertension  Doing well on current pain med regimen, complex situation with severe cardiopulmonary compromise , morbid obesity with limited exercise tolerance.  Going to see bariatric surgeon to look at weight loss options. Cont Tramadol '50mg'$  2-3  per day Diclofenac gel for Left lateral knee pain (likely OA) Oral swab toxicology to eval compliance with regimen  Pill counts today  PDMP reviewed , does have chronic stable dose of lorazepam prescribed by psychiatry  RTC 37momay see NP

## 2021-06-07 NOTE — Progress Notes (Signed)
? ? ?  Made called to [place] who will be mailing application to the patient home address. Application will go out in today's mail. Called patient to let him know application is being mail to him. ? ?.tr ? ? ?

## 2021-06-14 LAB — DRUG TOX MONITOR 1 W/CONF, ORAL FLD
Amphetamines: NEGATIVE ng/mL (ref <10)
Barbiturates: NEGATIVE ng/mL (ref <10)
Benzodiazepines: NEGATIVE ng/mL (ref <0.50)
Buprenorphine: NEGATIVE ng/mL (ref <0.10)
Cocaine: NEGATIVE ng/mL (ref <5.0)
Fentanyl: NEGATIVE ng/mL (ref <0.10)
Heroin Metabolite: NEGATIVE ng/mL (ref <1.0)
MARIJUANA: NEGATIVE ng/mL (ref <2.5)
MDMA: NEGATIVE ng/mL (ref <10)
Meprobamate: NEGATIVE ng/mL (ref <2.5)
Methadone: NEGATIVE ng/mL (ref <5.0)
Nicotine Metabolite: NEGATIVE ng/mL (ref <5.0)
Opiates: NEGATIVE ng/mL (ref <2.5)
Phencyclidine: NEGATIVE ng/mL (ref <10)
Tapentadol: NEGATIVE ng/mL (ref <5.0)
Tramadol: >500 ng/mL — ABNORMAL HIGH (ref <5.0)
Tramadol: POSITIVE ng/mL — AB (ref <5.0)
Zolpidem: NEGATIVE ng/mL (ref <5.0)

## 2021-06-14 LAB — DRUG TOX ALC METAB W/CON, ORAL FLD

## 2021-06-15 ENCOUNTER — Telehealth: Payer: Self-pay | Admitting: *Deleted

## 2021-06-15 NOTE — Telephone Encounter (Signed)
Oral swab drug screen was consistent for prescribed medications. Alcohol test not performed due to insufficient quantity of specimen. ?

## 2021-06-16 ENCOUNTER — Telehealth: Payer: Self-pay | Admitting: Internal Medicine

## 2021-06-16 NOTE — Telephone Encounter (Signed)
I called the patient and he is aware that this was a one time referral. He did not have any other questions. Nothing further needed.  ?

## 2021-06-17 DIAGNOSIS — Z86711 Personal history of pulmonary embolism: Secondary | ICD-10-CM | POA: Diagnosis not present

## 2021-06-17 DIAGNOSIS — I5032 Chronic diastolic (congestive) heart failure: Secondary | ICD-10-CM | POA: Diagnosis not present

## 2021-06-17 DIAGNOSIS — E1169 Type 2 diabetes mellitus with other specified complication: Secondary | ICD-10-CM | POA: Diagnosis not present

## 2021-06-17 DIAGNOSIS — Z8673 Personal history of transient ischemic attack (TIA), and cerebral infarction without residual deficits: Secondary | ICD-10-CM | POA: Diagnosis not present

## 2021-06-17 DIAGNOSIS — Z7409 Other reduced mobility: Secondary | ICD-10-CM | POA: Diagnosis not present

## 2021-06-17 DIAGNOSIS — Z9884 Bariatric surgery status: Secondary | ICD-10-CM | POA: Diagnosis not present

## 2021-06-17 DIAGNOSIS — I10 Essential (primary) hypertension: Secondary | ICD-10-CM | POA: Diagnosis not present

## 2021-06-17 DIAGNOSIS — I480 Paroxysmal atrial fibrillation: Secondary | ICD-10-CM | POA: Diagnosis not present

## 2021-06-23 ENCOUNTER — Other Ambulatory Visit: Payer: Self-pay | Admitting: Internal Medicine

## 2021-06-23 DIAGNOSIS — H1010 Acute atopic conjunctivitis, unspecified eye: Secondary | ICD-10-CM

## 2021-06-23 DIAGNOSIS — E785 Hyperlipidemia, unspecified: Secondary | ICD-10-CM

## 2021-06-23 NOTE — Telephone Encounter (Signed)
Requip refilled 

## 2021-06-23 NOTE — Telephone Encounter (Signed)
Please advise on refill request. ? ?Allergies  ?Allergen Reactions  ? Morphine Nausea And Vomiting and Other (See Comments)  ?  Severe hallucinations ?Other reaction(s): Hallucinations  ? Penicillins Other (See Comments)  ?  Passed out after injection ?Did it involve swelling of the face/tongue/throat, SOB, or low BP? Unknown ?Did it involve sudden or severe rash/hives, skin peeling, or any reaction on the inside of your mouth or nose? No ?Did you need to seek medical attention at a hospital or doctor's office? Yes ?When did it last happen?    teenager   ?If all above answers are ?NO?, may proceed with cephalosporin use. ?Other reaction(s): Dizziness, Unknown  ? Codeine Nausea Only  ? Propoxyphene N-Acetaminophen Nausea Only  ? ? ? ?Current Outpatient Medications:  ?  apixaban (ELIQUIS) 5 MG TABS tablet, TAKE 1 TABLET(5 MG) BY MOUTH TWICE DAILY, Disp: 180 tablet, Rfl: 0 ?  atorvastatin (LIPITOR) 80 MG tablet, TAKE 1 TABLET(80 MG) BY MOUTH DAILY, Disp: 90 tablet, Rfl: 1 ?  carboxymethylcellulose (REFRESH PLUS) 0.5 % SOLN, Place 1 drop into both eyes 3 (three) times daily as needed (dry eyes)., Disp: , Rfl:  ?  [employer] MELATONIN GUMMIES PO, Take 5 mg by mouth at bedtime., Disp: , Rfl:  ?  Docusate Sodium (DSS) 100 MG CAPS, Take 100 mg by mouth every other day., Disp: , Rfl:  ?  empagliflozin (JARDIANCE) 10 MG TABS tablet, Take 1 tablet (10 mg total) by mouth daily before breakfast., Disp: 90 tablet, Rfl: 1 ?  finasteride (PROSCAR) 5 MG tablet, Take 1 tablet (5 mg total) by mouth daily., Disp: 30 tablet, Rfl: 1 ?  flecainide (TAMBOCOR) 50 MG tablet, Take 1 tablet (50 mg total) by mouth every 12 (twelve) hours., Disp: 180 tablet, Rfl: 3 ?  loratadine (CLARITIN) 10 MG tablet, Take 10 mg by mouth daily., Disp: , Rfl:  ?  LORazepam (ATIVAN) 0.5 MG tablet, Take 1 tablet (0.5 mg total) by mouth at bedtime., Disp: 90 tablet, Rfl: 1 ?  metoprolol succinate (TOPROL-XL) 25 MG 24 hr tablet, Take 1 tablet (25 mg total) by mouth  daily., Disp: 90 tablet, Rfl: 3 ?  montelukast (SINGULAIR) 10 MG tablet, TAKE 1 TABLET(10 MG) BY MOUTH AT BEDTIME, Disp: 90 tablet, Rfl: 1 ?  Multiple Vitamin (MULTIVITAMIN WITH MINERALS) TABS tablet, Take 1 tablet by mouth daily., Disp: , Rfl:  ?  pantoprazole (PROTONIX) 40 MG tablet, TAKE 1 TABLET(40 MG) BY MOUTH DAILY, Disp: 90 tablet, Rfl: 1 ?  potassium chloride SA (KLOR-CON M) 20 MEQ tablet, TAKE 1 TABLET BY MOUTH TWICE DAILY, Disp: 180 tablet, Rfl: 3 ?  rOPINIRole (REQUIP) 0.5 MG tablet, Take 1 tablet (0.5 mg total) by mouth 3 (three) times daily. (Patient taking differently: Take 1.5 mg by mouth at bedtime.), Disp: 270 tablet, Rfl: 2 ?  sertraline (ZOLOFT) 100 MG tablet, Take 1.5 tablets (150 mg total) by mouth at bedtime., Disp: 135 tablet, Rfl: 1 ?  torsemide (DEMADEX) 20 MG tablet, TAKE 3 TABLETS BY MOUTH ONCE DAILY., Disp: 270 tablet, Rfl: 3 ?  traMADol (ULTRAM) 50 MG tablet, TAKE 1 TABLET(50 MG) BY MOUTH EVERY 8 HOURS AS NEEDED, Disp: 90 tablet, Rfl: 5 ?  zinc gluconate 50 MG tablet, Take 1 tablet (50 mg total) by mouth daily., Disp: 90 tablet, Rfl: 1 ? ?

## 2021-07-18 ENCOUNTER — Ambulatory Visit (INDEPENDENT_AMBULATORY_CARE_PROVIDER_SITE_OTHER): Payer: Medicare Other

## 2021-07-18 DIAGNOSIS — I5032 Chronic diastolic (congestive) heart failure: Secondary | ICD-10-CM

## 2021-07-18 DIAGNOSIS — I10 Essential (primary) hypertension: Secondary | ICD-10-CM

## 2021-07-18 DIAGNOSIS — I700 Atherosclerosis of aorta: Secondary | ICD-10-CM

## 2021-07-18 DIAGNOSIS — I480 Paroxysmal atrial fibrillation: Secondary | ICD-10-CM

## 2021-07-18 NOTE — Progress Notes (Signed)
? ?Chronic Care Management ?Pharmacy Note ? ?07/18/2021 ?Name:  [name] MRN:  [id number] DOB:  July 26, 1945 ? ?Summary: ?-Patient reports that since last appointment he feels he is doing well, endorses compliance to current medications  ?-Has not yet completed PAP applications for eliquis and now jardiance which has been started since last appointment ?-Has not been able to check BP at home as his BP cuffs have not been accurate (has tried 3 different BP cuffs) ? ?Recommendations/Changes made from today's visit: ?-Reviewed with patient PAP application requirements for jardiance and eliquis - patient has eliquis application at home that he will bring into office, will leave patient portion of jardiance PAP for patient at front desk to sign ? ? ?Subjective: ?[name] is an 76 y.o. year old male who is a primary patient of Janith Lima, MD.  The CCM team was consulted for assistance with disease management and care coordination needs.   ? ?Engaged with patient by telephone for follow up visit in response to provider referral for pharmacy case management and/or care coordination services.  ? ?Consent to Services:  ?The patient was given information about Chronic Care Management services, agreed to services, and gave verbal consent prior to initiation of services.  Please see initial visit note for detailed documentation.  ? ?Patient Care Team: ?Janith Lima, MD as PCP - General (Internal Medicine) ?Pixie Casino, MD as PCP - Cardiology (Cardiology) ?Himmelrich, Bryson Ha, RD (Inactive) as Dietitian Psychologist, prison and probation services) ?Leta Baptist, MD as Consulting Physician (Otolaryngology) ?Kathlee Nations, MD as Consulting Physician (Psychiatry) ?Pixie Casino, MD as Consulting Physician (Cardiology) ?Chyrel Masson, DO (Otolaryngology) ?Deneise Lever, MD as Consulting Physician (Pulmonary Disease) ?Opthamology,  as Set designer (Ophthalmology) ?Tomasa Blase, [facility] (Pharmacist) ? ?Recent office  visits: ?05/24/2021 - Dr. Ronnald Ramp - rybelsus not started - to start zinc 76m daily - f/u in 3 months  ? ?Recent consult visits: ?06/17/2021 - [facility] - encouraged patient to try GLP1 / nutrition - patient declined nutrition referral  ?06/07/2021 - Dr. KLetta Pate- Physical Medicine and Rehabilitation  - no changes to medications - f/u in 6 months  ?04/26/2021 - Dr. YAnnamaria Boots- Pulmonary Disease - DME ordered - heavy duty rolling walker with seat - f/u in 3 months  ?04/21/2021 - Dr. HDebara Pickett- Cardiology - consider right heart cath - follow up with Pulmonology to discuss dyspnea   - f/u in 6 months  ? ?Hospital visits: ?05/12/2021 - Right and left heart cath ? ? ?Objective: ? ?Lab Results  ?Component Value Date  ? CREATININE 0.97 05/10/2021  ? BUN 28 (H) 05/10/2021  ? GFR 56.04 (L) 11/09/2020  ? GFRNONAA >60 11/02/2020  ? GFRAA 71 10/27/2019  ? NA 148 (H) 05/12/2021  ? K 3.7 05/12/2021  ? CALCIUM 8.7 05/10/2021  ? CO2 29 05/10/2021  ? GLUCOSE 108 (H) 05/10/2021  ? ? ?Lab Results  ?Component Value Date/Time  ? HGBA1C 6.5 11/09/2020 03:02 PM  ? HGBA1C 6.5 05/12/2020 02:32 PM  ? GFR 56.04 (L) 11/09/2020 03:02 PM  ? GFR 59.03 (L) 05/12/2020 02:32 PM  ? MICROALBUR <0.7 11/09/2020 03:02 PM  ? MICROALBUR 7.1 10/27/2019 02:00 PM  ?  ?Last diabetic Eye exam:  ?Lab Results  ?Component Value Date/Time  ? HMDIABEYEEXA No Retinopathy 01/05/2021 12:00 AM  ?  ?Last diabetic Foot exam: No results found for: HMDIABFOOTEX  ? ?Lab Results  ?Component Value Date  ?  CHOL 149 11/09/2020  ? HDL 44.20 11/09/2020  ? [place]ham 77 11/09/2020  ? LDLDIRECT 91.0 05/16/2018  ? TRIG 139.0 11/09/2020  ? CHOLHDL 3 11/09/2020  ? ? ? ?  Latest Ref Rng & Units 11/02/2020  ?  7:36 PM 10/27/2019  ?  2:00 PM 05/16/2018  ?  4:16 PM  ?Hepatic Function  ?Total Protein 6.5 - 8.1 g/dL 6.2   6.1   6.9    ?Albumin 3.5 - 5.0 g/dL 3.1    3.6    ?AST 15 - 41 U/L '26   21   21    ' ?ALT 0 - 44 U/L '17   18   17    ' ?Alk Phosphatase 38 - 126  U/L 103    111    ?Total Bilirubin 0.3 - 1.2 mg/dL 0.7   0.9   0.8    ?Bilirubin, Direct 0.0 - 0.2 mg/dL  0.2     ? ? ?Lab Results  ?Component Value Date/Time  ? TSH 2.43 11/09/2020 03:02 PM  ? TSH 4.33 10/27/2019 02:00 PM  ? ? ? ?  Latest Ref Rng & Units 05/12/2021  ? 11:27 AM 05/12/2021  ? 10:31 AM 05/12/2021  ? 10:28 AM  ?CBC  ?Hemoglobin 13.0 - 17.0 g/dL 12.2   11.2   12.2    ? 11.9    ?Hematocrit 39.0 - 52.0 % 36.0   33.0   36.0    ? 35.0    ? ?No results found for: VD25OH ? ?Clinical ASCVD: No  ?The ASCVD Risk score (Arnett DK, et al., 2019) failed to calculate for the following reasons: ?  The patient has a prior MI or stroke diagnosis   ? ? ?  05/24/2021  ?  2:11 PM 12/16/2020  ?  4:15 PM 12/07/2020  ?  1:19 PM  ?Depression screen PHQ 2/9  ?Decreased Interest 1 0 1  ?Down, Depressed, Hopeless 1 0 1  ?PHQ - 2 Score 2 0 2  ?Altered sleeping 0    ?Tired, decreased energy 0    ?Change in appetite 0    ?Feeling bad or failure about yourself  0    ?Trouble concentrating 0    ?Moving slowly or fidgety/restless 0    ?Suicidal thoughts 0    ?PHQ-9 Score 2    ?  ? ?Social History  ? ?Tobacco Use  ?Smoking Status Former  ? Packs/day: 0.20  ? Years: 13.00  ? Pack years: 2.60  ? Types: Cigarettes  ? Start date: 04/03/1961  ? Quit date: 08/30/1974  ? Years since quitting: 46.9  ?Smokeless Tobacco Never  ? ?BP Readings from Last 3 Encounters:  ?06/07/21 140/69  ?05/24/21 (!) 142/76  ?05/12/21 120/75  ? ?Pulse Readings from Last 3 Encounters:  ?06/07/21 65  ?05/24/21 88  ?05/12/21 62  ? ?Wt Readings from Last 3 Encounters:  ?05/24/21 (!) 384 lb (174.2 kg)  ?05/12/21 (!) 384 lb (174.2 kg)  ?04/26/21 (!) 387 lb (175.5 kg)  ? ?BMI Readings from Last 3 Encounters:  ?05/24/21 52.08 kg/m?  ?05/12/21 52.08 kg/m?  ?04/26/21 52.49 kg/m?  ? ? ?Assessment/Interventions: Review of patient past medical history, allergies, medications, health status, including review of consultants reports, laboratory and other test data, was performed as part of  comprehensive evaluation and provision of chronic care management services.  ? ?SDOH:  (Social Determinants of Health) assessments and interventions performed: Yes ? ?SDOH Screenings  ? ?Alcohol Screen: Low Risk   ? Last Alcohol  Screening Score (AUDIT): 0  ?Depression (PHQ2-9): Low Risk   ? PHQ-2 Score: 2  ?Financial Resource Strain: Low Risk   ? Difficulty of Paying Living Expenses: Not hard at all  ?Food Insecurity: No Food Insecurity  ? Worried About Charity fundraiser in the Last Year: Never true  ? Ran Out of Food in the Last Year: Never true  ?Housing: Low Risk   ? Last Housing Risk Score: 0  ?Physical Activity: Inactive  ? Days of Exercise per Week: 0 days  ? Minutes of Exercise per Session: 0 min  ?Social Connections: Socially Integrated  ? Frequency of Communication with Friends and Family: More than three times a week  ? Frequency of Social Gatherings with Friends and Family: More than three times a week  ? Attends Religious Services: 1 to 4 times per year  ? Active Member of Clubs or Organizations: Yes  ? Attends Archivist Meetings: 1 to 4 times per year  ? Marital Status: Married  ?Stress: No Stress Concern Present  ? Feeling of Stress : Not at all  ?Tobacco Use: Medium Risk  ? Smoking Tobacco Use: Former  ? Smokeless Tobacco Use: Never  ? Passive Exposure: Not on file  ?Transportation Needs: No Transportation Needs  ? Lack of Transportation (Medical): No  ? Lack of Transportation (Non-Medical): No  ? ? ?CCM Care Plan ? ?Allergies  ?Allergen Reactions  ? Morphine Nausea And Vomiting and Other (See Comments)  ?  Severe hallucinations ?Other reaction(s): Hallucinations  ? Penicillins Other (See Comments)  ?  Passed out after injection ?Did it involve swelling of the face/tongue/throat, SOB, or low BP? Unknown ?Did it involve sudden or severe rash/hives, skin peeling, or any reaction on the inside of your mouth or nose? No ?Did you need to seek medical attention at a hospital or doctor's office?  Yes ?When did it last happen?    teenager   ?If all above answers are ?NO?, may proceed with cephalosporin use. ?Other reaction(s): Dizziness, Unknown  ? Codeine Nausea Only  ? Propoxyphene N-Acetaminop

## 2021-07-18 NOTE — Patient Instructions (Signed)
Visit Information ? ?Following are the goals we discussed today:  ? ?Manage My Medicine  ? ?Timeframe:  Long-Range Goal ?Priority:  Medium ?Start Date:   09/21/20                          ?Expected End Date:     04/23/22                 ? ?Follow Up Date 10/2021 ?  ?- call for medicine refill 2 or 3 days before it runs out ?- call if I am sick and can't take my medicine ?- keep a list of all the medicines I take; vitamins and herbals too ?- use a pillbox to sort medicine  ?-contact CCM pharmacist when out of pocket costs total > $1500 ?  ?Why is this important?   ?These steps will help you keep on track with your medicines. ? ?Plan: Telephone follow up appointment with care management team member scheduled for:  3 months ?The patient has been provided with contact information for the care management team and has been advised to call with any health related questions or concerns.  ? ?Tomasa Blase, PharmD ?Clinical Pharmacist, [place]  ? ? ?Please call the care guide team at [phone] if you need to cancel or reschedule your appointment.  ? ?Patient verbalizes understanding of instructions and care plan provided today and agrees to view in [place]. Active MyChart status confirmed with patient.   ? ?

## 2021-07-21 ENCOUNTER — Ambulatory Visit: Payer: Medicare Other | Admitting: Internal Medicine

## 2021-07-23 NOTE — Progress Notes (Deleted)
HPI male former smoker followed for OSA, Restless Legs,, complicated by A. fib, recurrent PE, bariatric surgery, major depression, dysphonia, midbrain CVA NPSG 10/25/05- severe OSA, AHI 79/ hr, CPAP to 16. Frequent limb jerks with sleep disturbance PFT 01/03/17-minimal obstruction without response to bronchodilator.  FVC 3.94/83%, FEV1 3.00/86%, ratio 2.76, TLC 96%, DLCO 78% Walk test on room air 04/08/2018-  Max HR 91, Min Sat 92% Walk Test on room air 04/26/2021-he was barely able to walk into the bathroom and out on room air before he desaturated to 80%. Arrival room air saturation today at rest 92%. -----------------------------------------------------------------------------------  \  04/26/21- 76 year old male former smoker followed for OSA, Restless Legs, complicated by A. Fib, diastolic CHF recurrent PE, bariatric surgery/ Morbid Obesity,  major depression, dysphonia, midbrain CVA, DM2, Gastric Ulcer, Moderate Pulmonary Hypertension,  -Melatonin, Lorazepam, Requip, Zoloft,  BiPap 25/ 22/Adapt O2 2l/ [employer]   Using self-fill tank but he's having trouble filling it.  Has had system x 2 weeks, ordered by Dr [name]/ Cardiology.  Download-compliance - requested Body weight today-387 lbs Covid vax-4 Phizer                          Looking for results on onox on BIPAP and O2 qualifying walk test Flu vax-had Cardiology concerned about exercise induce pulmonary hypertension or shunting to explain exercise induced hypoxemia. Consider bubble study or R heart cath. -----Patient states heart doctor wanted him to follow up and was supposed to send some notes over.  He notices just walking in house oxygen saturation drops even if he is wearing his portable oxygen.  Dyspnea on exertion.  He is planning to talk with his previous bariatric surgeon about dealing with ongoing obesity. Echocardiogram had shown right ventricular systolic 78.5, estimated right atrial pressure 3 mm. He denies acute event, chest pain,  palpitation, cough or wheeze. Handles have failed on his heavy-duty rolling walker with seat and he asks our help to get it replaced. Walk Test on room air 04/26/2021-he was barely able to walk into the bathroom and out on room air before he desaturated to 80%.  Arrival room air saturation today at rest 92%. Overnight oximetry dated 12/20 is still listed as future- need to contact DME CTa chest 03/22/21- IMPRESSION: No evidence of pulmonary emboli. Mild atelectatic changes in the right base. Stable heterogeneity in the right lobe of the thyroid with stable appearing nodule dating back to 2018 consistent with a benign etiology. Not clinically significant; no follow-up imaging recommended (ref: J Am Coll Radiol. 2015 Feb;12(2): 143-50). Aortic Atherosclerosis (ICD10-I70.0).  07/25/21- 76 year old male former smoker followed for OSA, Restless Legs, complicated by A. Fib, diastolic CHF recurrent PE, bariatric surgery/ Morbid Obesity,  major depression, dysphonia, midbrain CVA, DM2, Gastric Ulcer, Moderate Pulmonary Hypertension,  -Melatonin, Lorazepam, Requip, Zoloft,  BiPap 25/ 22/Adapt O2 2l/ [employer]   Using self-fill tank but he's having trouble filling it.  Has had system x 2 weeks, ordered by Dr [name]/ Cardiology.  Download-compliance - requested Body weight today-387 lbs Covid vax-4 Phizer                          Looking for results on onox on BIPAP and O2 qualifying walk test Flu vax-had Cardiology concerned about exercise induce pulmonary hypertension or shunting to explain exercise induced hypoxemia. Consider bubble study or R heart cath Overnight oximetry on BIPAP/ room air 04/24/21- 88% or less for 3 hrs23 minutes.  ECHO bubble study 2/14- negative for shunt Cardiac cath- very mild PHTN at rest with high cardiac outpu. Severe PHTN with exertional desaturation c/w severe diastolic dysfunction with development of severe pulmonary venous htn with low-level exercise.   ROS-see HPI   + =  positive Constitutional:    weight loss, night sweats, fevers, chills, fatigue, lassitude. HEENT:    headaches, difficulty swallowing, tooth/dental problems, sore throat,       sneezing, itching, ear ache,+ nasal congestion, post nasal drip, snoring CV:    chest pain, orthopnea, PND, swelling in lower extremities, anasarca,                                                 dizziness, +palpitations Resp:   +shortness of breath with exertion or at rest.                productive cough,   non-productive cough, coughing up of blood.              change in color of mucus.  wheezing.   Skin:    rash or lesions. GI:  No-   heartburn, indigestion, abdominal pain, nausea, vomiting,  GU:  MS:   joint pain, stiffness, Neuro-     nothing unusual Psych:  change in mood or affect.  depression or anxiety.   memory loss.  OBJ- Physical Exam General- Alert, Oriented, Affect-appropriate, Distress- none acute, + morbidly obese, +power scooter Skin- rash-none, lesions- none, excoriation- none Lymphadenopathy- none Head- atraumatic            Eyes- Gross vision intact, PERRLA, conjunctivae and secretions clear            Ears- +Hearing aid            Nose-  +moderate turbinate edema and mucus congestion, no-Septal dev, mucus, polyps,  erosion, perforation             Throat- Mallampati III , mucosa clear , drainage- none, tonsils- atrophic, + hoarse Neck- flexible , trachea midline, no stridor , thyroid nl, carotid no bruit Chest - symmetrical excursion , unlabored           Heart/CV- RRR , no murmur , no gallop  , no rub, nl s1 s2                           - JVD- none , edema- none, stasis changes- none, varices- none           Lung- clear to P&A, Unlabot=red during quiet conversation, wheeze- none, cough- none , dullness-none, rub- none           Chest wall-  Abd-  Br/ Gen/ Rectal- Not done, not indicated Extrem- cyanosis- none, clubbing, none, atrophy- none, strength- nl Neuro- grossly intact to  observation

## 2021-07-25 ENCOUNTER — Ambulatory Visit: Payer: Medicare Other | Admitting: Internal Medicine

## 2021-07-25 ENCOUNTER — Telehealth: Payer: Self-pay | Admitting: Internal Medicine

## 2021-07-27 ENCOUNTER — Encounter: Payer: Self-pay | Admitting: Internal Medicine

## 2021-07-27 NOTE — Telephone Encounter (Signed)
Dr. Annamaria Boots, please see pt's email regarding his pulmonary hypertension. Thanks.  ?

## 2021-07-27 NOTE — Telephone Encounter (Signed)
Called and spoke with patient to see if he knew who called him because I don't see anything in chart. Patient states that he is not sure but that he had canceled his OV due to not feeling good. Patient has been rescheduled. Patient also wants to know what stage of pulmonary hypertension he has. Will route to Dr. Annamaria Boots ? ?Next Appt ?With Pulmonology Baird Lyons, MD) ?08/01/2021 at 11:00 AM ?

## 2021-07-28 NOTE — Telephone Encounter (Signed)
Cardiac studies indicated very mild pulmonary hypertension at rest, but it gets very severe with minimal exertion. ?Recommend you discuss with your cardiologist how to manage this.  ?

## 2021-07-29 ENCOUNTER — Encounter: Payer: Self-pay | Admitting: Internal Medicine

## 2021-07-29 ENCOUNTER — Other Ambulatory Visit: Payer: Self-pay | Admitting: Internal Medicine

## 2021-07-29 NOTE — Progress Notes (Signed)
HPI ?male former smoker followed for OSA, Restless Legs,, complicated by A. fib, recurrent PE, bariatric surgery, major depression, dysphonia, midbrain CVA ?NPSG 10/25/05- severe OSA, AHI 79/ hr, CPAP to 16. Frequent limb jerks with sleep disturbance ?PFT 01/03/17-minimal obstruction without response to bronchodilator.  FVC 3.94/83%, FEV1 3.00/86%, ratio 2.76, TLC 96%, DLCO 78% ?Walk test on room air 04/08/2018-  Max HR 91, Min Sat 92% ?Walk Test on room air 04/26/2021-he was barely able to walk into the bathroom and out on room air before he desaturated to 80%. Arrival room air saturation today at rest 92%. ?----------------------------------------------------------------------------------- ? ? ?04/26/21- 76 year old male former smoker followed for OSA, Restless Legs, complicated by A. Fib, diastolic CHF recurrent PE, bariatric surgery/ Morbid Obesity,  major depression, dysphonia, midbrain CVA, DM2, Gastric Ulcer, Moderate Pulmonary Hypertension,  ?-Melatonin, Lorazepam, Requip, Zoloft,  ?BiPap 25/ 22/Adapt ?O2 2l/ [employer]   Using self-fill tank but he's having trouble filling it.  Has had system x 2 weeks, ordered by Dr [name]/ Cardiology.  ?Download-compliance - requested ?Body weight today-387 lbs ?Covid vax-4 Phizer                          Looking for results on onox on BIPAP and O2 qualifying walk test ?Flu vax-had ?Cardiology concerned about exercise induce pulmonary hypertension or shunting to explain exercise induced hypoxemia. Consider bubble study or R heart cath. ?-----Patient states heart doctor wanted him to follow up and was supposed to send some notes over.  ?He notices just walking in house oxygen saturation drops even if he is wearing his portable oxygen.  Dyspnea on exertion.  He is planning to talk with his previous bariatric surgeon about dealing with ongoing obesity. ?Echocardiogram had shown right ventricular systolic 46.2, estimated right atrial pressure 3 mm. ?He denies acute event, chest pain,  palpitation, cough or wheeze. ?Handles have failed on his heavy-duty rolling walker with seat and he asks our help to get it replaced. ?Walk Test on room air 04/26/2021-he was barely able to walk into the bathroom and out on room air before he desaturated to 80%.  Arrival room air saturation today at rest 92%. ?Overnight oximetry dated 12/20 is still listed as future- need to contact DME ?CTa chest 03/22/21- ?IMPRESSION: ?No evidence of pulmonary emboli. ?Mild atelectatic changes in the right base. ?Stable heterogeneity in the right lobe of the thyroid with stable ?appearing nodule dating back to 2018 consistent with a benign ?etiology. Not clinically significant; no follow-up imaging ?recommended (ref: J Am Coll Radiol. 2015 Feb;12(2): 143-50). ?Aortic Atherosclerosis (ICD10-I70.0). ? ?08/01/21- 76 year old male former smoker followed for OSA, Restless Legs, complicated by A. Fib, diastolic CHF recurrent PE, bariatric surgery/ Morbid Obesity,  major depression, dysphonia, midbrain CVA, DM2, Gastric Ulcer, Pulmonary Hypertension(worse with exertion),  ?-Melatonin, Lorazepam, Requip, Zoloft,  ?BiPap 25/ 22/Adapt ?O2 2l/ [employer]     ?Download-compliance - 90%, AHI 7.3/ hr ?Body weight today-376 lbs            Arrival O2 sat 95% ?Covid vax-4 Phizer                         ?Flu vax-had ?Noted by cardiology to have rapid rise in PA pressure with minimal exertion.PFT in 2018- minimal obstruction. Little cough or wheeze. Cardiology will guide management of PAH. He continues O2 2L.  ?CTa chest 03/22/21 did not show evidence of chronic PE. ?Some insomnia, managed with melatonin and lorazepam. ? ?  ROS-see HPI   + = positive ?Constitutional:    weight loss, night sweats, fevers, chills, fatigue, lassitude. ?HEENT:    headaches, difficulty swallowing, tooth/dental problems, sore throat,  ?     sneezing, itching, ear ache,+ nasal congestion, post nasal drip, snoring ?CV:    chest pain, orthopnea, PND, swelling in lower extremities,  anasarca,                                               ?  dizziness, +palpitations ?Resp:   +shortness of breath with exertion or at rest.   ?             productive cough,   non-productive cough, coughing up of blood.   ?           change in color of mucus.  wheezing.   ?Skin:    rash or lesions. ?GI:  No-   heartburn, indigestion, abdominal pain, nausea, vomiting,  ?GU:  ?MS:   joint pain, stiffness, ?Neuro-     nothing unusual ?Psych:  change in mood or affect.  depression or anxiety.   memory loss. ? ?OBJ- Physical Exam ?General- Alert, Oriented, Affect-appropriate, Distress- none acute, + morbidly obese, +power scooter ?Skin- rash-none, lesions- none, excoriation- none ?Lymphadenopathy- none ?Head- atraumatic ?           Eyes- Gross vision intact, PERRLA, conjunctivae and secretions clear ?           Ears- +Hearing aid ?           Nose-  +moderate turbinate edema and mucus congestion, no-Septal dev, mucus, polyps,  erosion, perforation  ?           Throat- Mallampati III , mucosa clear , drainage- none, tonsils- atrophic, + hoarse ?Neck- flexible , trachea midline, no stridor , thyroid nl, carotid no bruit ?Chest - symmetrical excursion , unlabored ?          Heart/CV- RRR , no murmur , no gallop  , no rub, nl s1 s2 ?                          - JVD- none , edema- none, stasis changes- none, varices- none ?          Lung- clear to P&A, Unlabored during quiet conversation, wheeze- none, cough- none , dullness-none, rub- none ?          Chest wall-  ?Abd-  ?Br/ Gen/ Rectal- Not done, not indicated ?Extrem- cyanosis- none, clubbing, none, atrophy- none, strength- nl ?Neuro- grossly intact to observation ? ? ? ? ? ? ?

## 2021-07-31 DIAGNOSIS — I10 Essential (primary) hypertension: Secondary | ICD-10-CM | POA: Diagnosis not present

## 2021-07-31 DIAGNOSIS — I480 Paroxysmal atrial fibrillation: Secondary | ICD-10-CM | POA: Diagnosis not present

## 2021-07-31 DIAGNOSIS — I5032 Chronic diastolic (congestive) heart failure: Secondary | ICD-10-CM

## 2021-08-01 ENCOUNTER — Telehealth: Payer: Self-pay | Admitting: Internal Medicine

## 2021-08-01 ENCOUNTER — Other Ambulatory Visit: Payer: Self-pay | Admitting: Internal Medicine

## 2021-08-01 ENCOUNTER — Ambulatory Visit (INDEPENDENT_AMBULATORY_CARE_PROVIDER_SITE_OTHER): Payer: Medicare Other | Admitting: Internal Medicine

## 2021-08-01 ENCOUNTER — Encounter: Payer: Self-pay | Admitting: Internal Medicine

## 2021-08-01 VITALS — BP 130/70 | HR 88 | Ht 72.0 in | Wt 376.0 lb

## 2021-08-01 DIAGNOSIS — I2729 Other secondary pulmonary hypertension: Secondary | ICD-10-CM

## 2021-08-01 DIAGNOSIS — Z6841 Body Mass Index (BMI) 40.0 and over, adult: Secondary | ICD-10-CM | POA: Diagnosis not present

## 2021-08-01 DIAGNOSIS — R0602 Shortness of breath: Secondary | ICD-10-CM | POA: Diagnosis not present

## 2021-08-01 DIAGNOSIS — G463 Brain stem stroke syndrome: Secondary | ICD-10-CM

## 2021-08-01 DIAGNOSIS — R0609 Other forms of dyspnea: Secondary | ICD-10-CM | POA: Diagnosis not present

## 2021-08-01 DIAGNOSIS — F5101 Primary insomnia: Secondary | ICD-10-CM | POA: Diagnosis not present

## 2021-08-01 DIAGNOSIS — G4733 Obstructive sleep apnea (adult) (pediatric): Secondary | ICD-10-CM

## 2021-08-01 NOTE — Telephone Encounter (Signed)
Pts spouse inquiring if pt needs monthly b12 injections ? ?Please advise ?

## 2021-08-01 NOTE — Patient Instructions (Signed)
We can continue BIPAP 25/22 and oxygen at 2l ? ?Ok to take lorazepam and melatonin at bedtime to help you sleep ? ?Dr Debara Pickett will advise you on management of your pulmonary hypertension. ? ?Please call if we can help with your breathing issue. ?

## 2021-08-02 ENCOUNTER — Other Ambulatory Visit: Payer: Self-pay | Admitting: Physical Medicine & Rehabilitation

## 2021-08-02 DIAGNOSIS — Z20822 Contact with and (suspected) exposure to covid-19: Secondary | ICD-10-CM | POA: Diagnosis not present

## 2021-08-03 NOTE — Telephone Encounter (Signed)
Pt has been informed that level will be checked at next OV ?

## 2021-08-04 ENCOUNTER — Telehealth: Payer: Self-pay | Admitting: *Deleted

## 2021-08-04 DIAGNOSIS — Z20822 Contact with and (suspected) exposure to covid-19: Secondary | ICD-10-CM | POA: Diagnosis not present

## 2021-08-04 MED ORDER — TRAMADOL HCL 50 MG PO TABS: ORAL_TABLET | ORAL | 5 refills | Status: DC

## 2021-08-04 NOTE — Telephone Encounter (Signed)
Can you please re-send the tramadol to the pharmacy.  E-Scribe was down yesterday when you originally sent the rx to the pharmacy so it did not transmit. ?

## 2021-08-08 ENCOUNTER — Telehealth (HOSPITAL_BASED_OUTPATIENT_CLINIC_OR_DEPARTMENT_OTHER): Payer: Medicare Other | Admitting: Psychiatry

## 2021-08-08 ENCOUNTER — Encounter: Payer: Self-pay | Admitting: Internal Medicine

## 2021-08-08 ENCOUNTER — Encounter (HOSPITAL_COMMUNITY): Payer: Self-pay | Admitting: Psychiatry

## 2021-08-08 DIAGNOSIS — F419 Anxiety disorder, unspecified: Secondary | ICD-10-CM

## 2021-08-08 DIAGNOSIS — F330 Major depressive disorder, recurrent, mild: Secondary | ICD-10-CM | POA: Diagnosis not present

## 2021-08-08 MED ORDER — SERTRALINE HCL 100 MG PO TABS: 150.0000 mg | ORAL_TABLET | Freq: Every day | ORAL | 1 refills | Status: DC

## 2021-08-08 MED ORDER — LORAZEPAM 0.5 MG PO TABS: 0.5000 mg | ORAL_TABLET | Freq: Every day | ORAL | 1 refills | Status: DC

## 2021-08-08 NOTE — Progress Notes (Signed)
Virtual Visit via Telephone Note ? ?I connected with [name] on 08/08/21 at  2:00 PM EDT by telephone and verified that I am speaking with the correct person using two identifiers. ? ?Location: ?Patient: Home ?Provider: Home Office ?  ?I discussed the limitations, risks, security and privacy concerns of performing an evaluation and management service by telephone and the availability of in person appointments. I also discussed with the patient that there may be a patient responsible charge related to this service. The patient expressed understanding and agreed to proceed. ? ? ?History of Present Illness: ?Patient is evaluated by phone session.  He admitted lately more sad, anxious and tired.  He is diagnosed with pulmonary hypertension and taking medication but he is concerned about his long-term prognosis.  He understand healing will be impossible but tried to keep his illness stable.  He endorsed not happy because he is homebound and does not do much due to extreme fatigue.  He admitted not able to walk to the mailbox and feel dependent on his wife who is very supportive and take care of doctor's appointment.  He also endorsed financial strain because his medicines are very expensive.  But he is relieved that recently he paid off his house.  His enjoyment is having the company of her 38-year-old grandchild.  His son lives close by.  He admitted dysphoria but denies any crying spells or any feeling of hopelessness or suicidal thoughts.  He does not like when people talk about weight loss because he cannot walk, exercise because of his chronic health issues.  However recently his physician added Jardiance and is hoping that may help his weight loss.  Like to keep the Ativan and Zoloft because he believed it is helping his depression and anxiety.  He is using BiPAP at night.  He is getting sleep but he also have sometimes shortness of breath.  Denies any hallucination or any paranoia.  He denies any anger.   He is not interested in therapy for like to keep the current medication and follow up in 3 months instead of 6 months. ?  ?Past Psychiatric History: Reviewed ?H/O inpatient in 2006 for depression and having suicidal thoughts. Took Risperdal but d/c after stroke in March 2018. ? ? Psychiatric Specialty Exam: ?Physical Exam  ?Review of Systems  ?Respiratory:  Positive for shortness of breath.    ?Weight (!) 376 lb (170.6 kg).Body mass index is 50.99 kg/m?.  ?General Appearance: NA  ?Eye Contact:  NA  ?Speech:  Slow  ?Volume:  Decreased  ?Mood:  Anxious and Dysphoric, tired  ?Affect:  NA  ?Thought Process:  Descriptions of Associations: Intact  ?Orientation:  Full (Time, Place, and Person)  ?Thought Content:  Rumination  ?Suicidal Thoughts:  No  ?Homicidal Thoughts:  No  ?Memory:  Immediate;   Good ?Recent;   Fair ?Remote;   Fair  ?Judgement:  Intact  ?Insight:  Present  ?Psychomotor Activity:  NA  ?Concentration:  Concentration: Fair and Attention Span: Fair  ?Recall:  Fair  ?Fund of Knowledge:  Good  ?Language:  Good  ?Akathisia:  No  ?Handed:  Right  ?AIMS (if indicated):     ?Assets:  Communication Skills ?Desire for Improvement ?Housing ?Resilience ?Social Support  ?ADL's:  Intact  ?Cognition:  WNL  ?Sleep:   fair with BiPAP  ? ? ? ? ? ?Assessment and Plan: ?Major depressive disorder, recurrent.  Anxiety. ? ?Patient is dysphoric and more anxious because of recent diagnosis of pulmonary  hypertension.  Most of his symptoms are physiological as not able to walk around, feel burden to his wife.  He does not want to change his medication.  He is not taking trazodone after he realized it has more side effects and benefits.  However he is still take Requip.  We talk about therapy but he is not interested.  He like to keep his current medication but wants to follow-up every 3 months rather than 6 months.  Discussed medication side effects and benefits.  Continue Zoloft 150 mg daily and Ativan 0.5 mg at bedtime.  We will  follow up in 3 months.  I recommended to call us back if is any question, concern if he feels worsening of the symptoms. ? ?Follow Up Instructions: ? ?  ?I discussed the assessment and treatment plan with the patient. The patient was provided an opportunity to ask questions and all were answered. The patient agreed with the plan and demonstrated an understanding of the instructions. ?  ?The patient was advised to call back or seek an in-person evaluation if the symptoms worsen or if the condition fails to improve as anticipated. ? ?Collaboration of Care: Primary Care Provider AEB notes are available in epic to review. ? ?Patient/Guardian was advised Release of Information must be obtained prior to any record release in order to collaborate their care with an outside provider. Patient/Guardian was advised if they have not already done so to contact the registration department to sign all necessary forms in order for [place] to release information regarding their care.  ? ?Consent: Patient/Guardian gives verbal consent for treatment and assignment of benefits for services provided during this visit. Patient/Guardian expressed understanding and agreed to proceed.   ? ?I provided 32 minutes of non-face-to-face time during this encounter. ? ? ?Kathlee Nations, MD  ?

## 2021-08-15 ENCOUNTER — Telehealth: Payer: Self-pay

## 2021-08-15 NOTE — Progress Notes (Signed)
? ? ?Chronic Care Management ?Pharmacy Assistant  ? ?Name: [name]  MRN: [id number] DOB: 09-28-1945 ? ? ?Reason for Encounter: Disease State-Adherence ?  ?Recent office visits:  ?None ? ?Recent consult visits:  ?08/01/21 Baird Lyons D, MD-Pulmonary Disease (Dyspnea) Order:Ambulatory Referral for DME; Medication change: none ? ?Hospital visits:  ?None in previous 6 months ? ?Medications: ?Outpatient Encounter Medications as of 08/15/2021  ?Medication Sig  ? apixaban (ELIQUIS) 5 MG TABS tablet TAKE 1 TABLET(5 MG) BY MOUTH TWICE DAILY  ? atorvastatin (LIPITOR) 80 MG tablet TAKE 1 TABLET(80 MG) BY MOUTH DAILY  ? carboxymethylcellulose (REFRESH PLUS) 0.5 % SOLN Place 1 drop into both eyes 3 (three) times daily as needed (dry eyes).  ? [facility] MELATONIN GUMMIES PO Take 5 mg by mouth at bedtime.  ? Docusate Sodium (DSS) 100 MG CAPS Take 100 mg by mouth every other day.  ? empagliflozin (JARDIANCE) 10 MG TABS tablet Take 1 tablet (10 mg total) by mouth daily before breakfast.  ? finasteride (PROSCAR) 5 MG tablet Take 1 tablet (5 mg total) by mouth daily.  ? flecainide (TAMBOCOR) 50 MG tablet TAKE 1 TABLET(50 MG) BY MOUTH EVERY 12 HOURS  ? loratadine (CLARITIN) 10 MG tablet Take 10 mg by mouth daily.  ? LORazepam (ATIVAN) 0.5 MG tablet Take 1 tablet (0.5 mg total) by mouth at bedtime.  ? metoprolol succinate (TOPROL-XL) 25 MG 24 hr tablet Take 1 tablet (25 mg total) by mouth daily.  ? montelukast (SINGULAIR) 10 MG tablet TAKE 1 TABLET(10 MG) BY MOUTH AT BEDTIME  ? Multiple Vitamin (MULTIVITAMIN WITH MINERALS) TABS tablet Take 1 tablet by mouth daily.  ? pantoprazole (PROTONIX) 40 MG tablet TAKE 1 TABLET(40 MG) BY MOUTH DAILY  ? potassium chloride SA (KLOR-CON M) 20 MEQ tablet TAKE 1 TABLET BY MOUTH TWICE DAILY  ? rOPINIRole (REQUIP) 0.5 MG tablet TAKE 1 TABLET(0.5 MG) BY MOUTH THREE TIMES DAILY  ? sertraline (ZOLOFT) 100 MG tablet Take 1.5 tablets (150 mg total) by mouth at bedtime.  ? torsemide (DEMADEX) 20 MG tablet  TAKE 3 TABLETS BY MOUTH ONCE DAILY  ? traMADol (ULTRAM) 50 MG tablet TAKE 1 TABLET BY MOUTH EVERY 8 HOURS AS NEEDED  ? zinc gluconate 50 MG tablet Take 1 tablet (50 mg total) by mouth daily.  ? ?No facility-administered encounter medications on file as of 08/15/2021.  ? ?[facility] for General Review Call ? ? ?Chart Review: ? ?Have there been any documented new, changed, or discontinued medications since last visit? No (If yes, include name, dose, frequency, date) ?Has there been any documented recent hospitalizations or ED visits since last visit with Clinical Pharmacist? No ?Brief Summary (including medication and/or Diagnosis changes): ? ? ?Adherence Review: ? ?Does the Clinical Pharmacist Assistant have access to adherence rates? Yes ?Adherence rates for STAR metric medications (List medication(s)/day supply/ last 2 fill dates). ?Adherence rates for medications indicated for disease state being reviewed (List medication(s)/day supply/ last 2 fill dates). ?Does the patient have >5 day gap between last estimated fill dates for any of the above medications or other medication gaps? No ?Reason for medication gaps. ? ? ?Disease State Questions: ? ?Able to connect with Patient? Yes ?Did patient have any problems with their health recently? No ?Note problems and Concerns: ?Have you had any admissions or emergency room visits or worsening of your condition(s) since last visit? No ?Details of ED visit, hospital visit and/or worsening condition(s): ?Have you had any visits with new specialists or  providers since your last visit? No ?Explain: ?Have you had any new health care problem(s) since your last visit? No ?New problem(s) reported: ?Have you run out of any of your medications since you last spoke with clinical pharmacist? No ?What caused you to run out of your medications? ?Are there any medications you are not taking as prescribed? No ?What kept you from taking your medications as prescribed? ?Are  you having any issues or side effects with your medications? No ?Note of issues or side effects: ?Do you have any other health concerns or questions you want to discuss with your Clinical Pharmacist before your next visit? No ?Note additional concerns and questions from Patient. ?Are there any health concerns that you feel we can do a better job addressing? No ?Note Patient's response. ?Are you having any problems with any of the following since the last visit: (select all that apply) ? None ? Details: ?12. Any falls since last visit? No ? Details: ?13. Any increased or uncontrolled pain since last visit? No ? Details: ?14. Next visit Type: telephone ?      Visit with: ?       Date:10/17/21 ?       Time:1pm ? ?15. Additional Details? No   ? ?Care Gaps: ?Colonoscopy-07/31/13 ?Diabetic Foot Exam-11/09/20 ?Ophthalmology-01/05/21 ?Dexa Scan - NA ?Annual Well Visit - NA ?Micro albumin-11/09/20 ?Hemoglobin A1c- 11/09/20 ?  ?Star Rating Drugs: ?Atorvastatin 80 mg-last fill 05/30/21 90 ds ?  ?  ?Ethelene Hal ?Clinical Pharmacist Assistant ?[phone]  ?

## 2021-08-17 DIAGNOSIS — I2729 Other secondary pulmonary hypertension: Secondary | ICD-10-CM | POA: Insufficient documentation

## 2021-08-17 NOTE — Assessment & Plan Note (Signed)
Working with melatonin and lorazepa ?

## 2021-08-17 NOTE — Assessment & Plan Note (Signed)
Benefits from BIPAP with good compliance and adequate control ?Plan- continue BIPAP 25/22,    supplemental O2 2L ?

## 2021-08-17 NOTE — Assessment & Plan Note (Signed)
Any weight loss would help ?

## 2021-08-17 NOTE — Assessment & Plan Note (Signed)
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 He is on O2. ?I think cardiology is best positioned to work with his labile pulmonary hypertension. ?

## 2021-08-24 ENCOUNTER — Telehealth: Payer: Self-pay | Admitting: Internal Medicine

## 2021-08-24 NOTE — Telephone Encounter (Signed)
Pt is needing assistance with cost with purchasing this medication.   Pt is having transportation issues so pt is  emailing information and I will place in box or office of [place].   FYI

## 2021-08-24 NOTE — Telephone Encounter (Signed)
Called in and states he has questions for Quillian Quince regarding Eliquis and Jardiance medications.   Please call at earliest convenience.

## 2021-08-24 NOTE — Telephone Encounter (Signed)
Called and spoke with patient, has completed eliquis PAP, jardiance PAP started and left at front desk for patient's wife who will come in to get the application and return once completed   Tomasa Blase, PharmD Clinical Pharmacist, [place]

## 2021-08-26 NOTE — Telephone Encounter (Signed)
NOTE NOT NEEDED ?

## 2021-09-07 ENCOUNTER — Telehealth: Payer: Self-pay | Admitting: Internal Medicine

## 2021-09-07 NOTE — Telephone Encounter (Signed)
Patient requested samples of jardiance 10 mg. His wife will pick up samplez and coupons tomorrow.

## 2021-09-07 NOTE — Telephone Encounter (Signed)
Patient calling the office for samples of medication:   1.  What medication and dosage are you requesting samples for? Jardiance '10mg'$   2.  Are you currently out of this medication? yes

## 2021-09-12 ENCOUNTER — Other Ambulatory Visit: Payer: Self-pay | Admitting: Internal Medicine

## 2021-09-12 DIAGNOSIS — I480 Paroxysmal atrial fibrillation: Secondary | ICD-10-CM

## 2021-09-27 ENCOUNTER — Other Ambulatory Visit: Payer: Self-pay | Admitting: Internal Medicine

## 2021-09-27 DIAGNOSIS — K2100 Gastro-esophageal reflux disease with esophagitis, without bleeding: Secondary | ICD-10-CM

## 2021-09-30 NOTE — Telephone Encounter (Signed)
Pt stated he needs to discuss his form that was filled out. He said he received a denial due to the form missing 2 signatures, a date, and proof of income.   Please advise  CB:[phone]

## 2021-10-13 NOTE — Telephone Encounter (Signed)
Pt called in requesting to speak with [place]. He would like to verify that the paperwork for his Jardiance and Eliquis was sent.  Please advise  CB: [phone]

## 2021-10-17 ENCOUNTER — Telehealth: Payer: Medicare Other

## 2021-10-19 ENCOUNTER — Telehealth: Payer: Self-pay | Admitting: Internal Medicine

## 2021-10-19 NOTE — Telephone Encounter (Signed)
Pt is requesting a call from [place] with an update on eliquis. He stated he has heard back from [place].   Please advise  CB: [phone]

## 2021-10-21 DIAGNOSIS — R350 Frequency of micturition: Secondary | ICD-10-CM | POA: Diagnosis not present

## 2021-10-21 DIAGNOSIS — N200 Calculus of kidney: Secondary | ICD-10-CM | POA: Diagnosis not present

## 2021-10-21 DIAGNOSIS — N401 Enlarged prostate with lower urinary tract symptoms: Secondary | ICD-10-CM | POA: Diagnosis not present

## 2021-11-07 ENCOUNTER — Encounter (HOSPITAL_COMMUNITY): Payer: Self-pay | Admitting: Psychiatry

## 2021-11-07 ENCOUNTER — Telehealth (HOSPITAL_BASED_OUTPATIENT_CLINIC_OR_DEPARTMENT_OTHER): Payer: Medicare Other | Admitting: Psychiatry

## 2021-11-07 DIAGNOSIS — F419 Anxiety disorder, unspecified: Secondary | ICD-10-CM

## 2021-11-07 DIAGNOSIS — F330 Major depressive disorder, recurrent, mild: Secondary | ICD-10-CM | POA: Diagnosis not present

## 2021-11-07 MED ORDER — SERTRALINE HCL 100 MG PO TABS: 150.0000 mg | ORAL_TABLET | Freq: Every day | ORAL | 0 refills | Status: DC

## 2021-11-07 MED ORDER — LORAZEPAM 0.5 MG PO TABS: 0.5000 mg | ORAL_TABLET | Freq: Every day | ORAL | 0 refills | Status: DC

## 2021-11-07 NOTE — Progress Notes (Signed)
Virtual Visit via Telephone Note  I connected with [name] on 11/07/21 at  2:20 PM EDT by telephone and verified that I am speaking with the correct person using two identifiers.  Location: Patient: Home Provider: Home Office   I discussed the limitations, risks, security and privacy concerns of performing an evaluation and management service by telephone and the availability of in person appointments. I also discussed with the patient that there may be a patient responsible charge related to this service. The patient expressed understanding and agreed to proceed.   History of Present Illness: Patient is evaluated by phone session.  He admitted to feeling more tired and having shortness of breath.  During the conversation he has to take some break to catch up his breath.  He admitted no major changes other than his health is been deteriorating.  He enjoyed the company of the 106-year-old grandchild.  He does not go outside because he is now on 24/7 oxygen and he uses BiPAP for sleep.  His son lives close by.  His wife is very supportive who takes him to the doctor's appointment.  Denies any crying spells or any feeling of hopelessness or worthlessness.  He denies any suicidal thoughts.  His appetite is fair and his weight is unchanged from the past.  He wants to keep his current medication.  He is taking Ativan and Zoloft.  He has no tremors or shakes.   Past Psychiatric History: Reviewed H/O inpatient in 2006 for depression and having suicidal thoughts. Took Risperdal but d/c after stroke in March 2018.  Psychiatric Specialty Exam: Physical Exam  Review of Systems  Constitutional:  Positive for fatigue.  Respiratory:  Positive for shortness of breath.     Weight (!) 376 lb (170.6 kg).There is no height or weight on file to calculate BMI.  General Appearance: NA  Eye Contact:  NA  Speech:  Slow  Volume:  Decreased  Mood:  Anxious  Affect:  NA  Thought Process:  Descriptions of  Associations: Intact  Orientation:  Full (Time, Place, and Person)  Thought Content:  Rumination  Suicidal Thoughts:  No  Homicidal Thoughts:  No  Memory:  Immediate;   Good Recent;   Fair Remote;   Fair  Judgement:  Intact  Insight:  Present  Psychomotor Activity:  NA  Concentration:  Concentration: Fair and Attention Span: Fair  Recall:  [employer] of Knowledge:  Good  Language:  Good  Akathisia:  No  Handed:  Right  AIMS (if indicated):     Assets:  Communication Skills Desire for Improvement Housing Resilience Social Support  ADL's:  Intact  Cognition:  WNL  Sleep:   fair with BiPAP      Assessment and Plan: Major depressive disorder, recurrent.  Anxiety.  Patient admitted feeling more fatigue and having shortness of breath due to worsening of his pulmonary hypertension.  He needed 24/7 oxygen.  He has difficulty keeping the conversation because he gets short of breath.  He does not want to change the medication.  I offered to see follow-up in 6 months but patient preferred to keep the appointment in 3 months.  We will continue the medication as patient does not want to change.  Zoloft 150 mg daily and Ativan 0.5 mg at bedtime.  Recommended to call us back if is any question of any concern.  Follow-up in 3 months.  Follow Up Instructions:    I discussed the assessment and treatment plan with  the patient. The patient was provided an opportunity to ask questions and all were answered. The patient agreed with the plan and demonstrated an understanding of the instructions.  Collaboration of Care: Other provider involved in patient's care AEB notes are available in epic to review.  Patient/Guardian was advised Release of Information must be obtained prior to any record release in order to collaborate their care with an outside provider. Patient/Guardian was advised if they have not already done so to contact the registration department to sign all necessary forms in order for  [place] to release information regarding their care.   Consent: Patient/Guardian gives verbal consent for treatment and assignment of benefits for services provided during this visit. Patient/Guardian expressed understanding and agreed to proceed.     The patient was advised to call back or seek an in-person evaluation if the symptoms worsen or if the condition fails to improve as anticipated.  I provided 22 minutes of non-face-to-face time during this encounter.   Kathlee Nations, MD

## 2021-12-08 ENCOUNTER — Encounter: Payer: Medicare Other | Admitting: Physical Medicine & Rehabilitation

## 2021-12-13 ENCOUNTER — Ambulatory Visit: Payer: Medicare Other | Admitting: Physical Medicine & Rehabilitation

## 2021-12-14 ENCOUNTER — Telehealth: Payer: Self-pay | Admitting: Internal Medicine

## 2021-12-14 NOTE — Telephone Encounter (Signed)
Patient called and stated that he had a missed call from this office. He wants to know who called him and why. There is no message in the chart. He insisted on leaving a message.

## 2022-01-01 ENCOUNTER — Other Ambulatory Visit: Payer: Self-pay | Admitting: Internal Medicine

## 2022-01-01 DIAGNOSIS — I480 Paroxysmal atrial fibrillation: Secondary | ICD-10-CM

## 2022-01-01 NOTE — Progress Notes (Deleted)
Cardiology Clinic Note   Patient Name: [name] Date of Encounter: 01/01/2022  Primary Care Provider:  Janith Lima, MD Primary Cardiologist:  Pixie Casino, MD  Patient Profile    [name] presents to the clinic today for evaluation of his increased DOE.  Past Medical History    Past Medical History:  Diagnosis Date   Anemia 07/29/2013   Asthma    childhood asthma   Benign essential HTN    Benign prostatic hypertrophy    several prostate biopsies neg for cancer.    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   FH: colonic polyps 2010   Gout    History of kidney stones    Hypertension    Kidney cysts    Morbid obesity ([place])    Numbness    feet / legs   PAF (paroxysmal atrial fibrillation) with RVR 07/29/2013   Pericarditis 2015   s/p window   Recurrent pulmonary emboli ([place]) 06/13/2015   Sleep apnea    USES C PAP   Stroke syndrome    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   Past Surgical History:  Procedure Laterality Date   ARTHROSCOPY KNEE W/ DRILLING     BREATH TEK H PYLORI N/A 12/23/2012   Procedure: BREATH TEK H PYLORI;  Surgeon: Gayland Curry, MD;  Location: [facility];  Service: General;  Laterality: N/A;   [place] N/A 07/31/2013   Procedure: COLONOSCOPY;  Surgeon: Gatha Mayer, MD;  Location: [facility];  Service: Endoscopy;  Laterality: N/A;   ESOPHAGOGASTRODUODENOSCOPY N/A 07/22/2013   Procedure: ESOPHAGOGASTRODUODENOSCOPY (EGD);  Surgeon: Irene Shipper, MD;  Location: [facility] ENDOSCOPY;  Service: Endoscopy;  Laterality: N/A;   ESOPHAGOGASTRODUODENOSCOPY N/A 07/31/2013   Procedure:  ESOPHAGOGASTRODUODENOSCOPY (EGD);  Surgeon: Gatha Mayer, MD;  Location: [facility] ENDOSCOPY;  Service: Endoscopy;  Laterality: N/A;   INTRAOPERATIVE TRANSESOPHAGEAL ECHOCARDIOGRAM N/A 07/18/2013   Procedure: INTRAOPERATIVE TRANSESOPHAGEAL ECHOCARDIOGRAM;  Surgeon: Grace Isaac, MD;  Location: [facility];  Service: Open Heart Surgery;  Laterality: N/A;   LAPAROSCOPIC GASTRIC BANDING WITH HIATAL HERNIA REPAIR N/A 04/08/2013   Procedure: LAPAROSCOPIC GASTRIC BANDING WITH possible  HIATAL HERNIA REPAIR;  Surgeon: Gayland Curry, MD;  Location: [facility] ORS;  Service: General;  Laterality: N/A;   LITHOTRIPSY     renal calculi     RIGHT AND LEFT HEART CATH N/A 05/12/2021   Procedure: RIGHT AND LEFT HEART CATH;  Surgeon: Jolaine Artist, MD;  Location: [facility];  Service: Cardiovascular;  Laterality: N/A;   SUBXYPHOID PERICARDIAL WINDOW N/A 07/18/2013   Procedure: SUBXYPHOID PERICARDIAL WINDOW;  Surgeon: Grace Isaac, MD;  Location: [facility] OR;  Service: Thoracic;  Laterality: N/A;   TONSILLECTOMY     total knee replacment     bilat   trigger finger repair     also lue, cts surgically repaired   WISDOM TOOTH EXTRACTION      Allergies  Allergies  Allergen Reactions   Morphine Nausea And Vomiting and Other (See Comments)    Severe hallucinations Other reaction(s): Hallucinations   Penicillins Other (See Comments)    Passed out after  injection Did it involve swelling of the face/tongue/throat, SOB, or low BP? Unknown Did it involve sudden or severe rash/hives, skin peeling, or any reaction on the inside of your mouth or nose? No Did you need to seek medical attention at a hospital or doctor's office? Yes When did it last happen?    teenager   If all above answers are "NO", may proceed with cephalosporin use. Other reaction(s): Dizziness, Unknown   Codeine Nausea Only   Propoxyphene N-Acetaminophen Nausea Only    History of Present Illness    [name] has a PMH of OSA, GERD,  dysphagia poststroke, type 2 diabetes, peripheral neuropathy, BPH, morbid obesity, paroxysmal atrial fibrillation, diastolic CHF, recurrent pulmonary emboli, essential hypertension, and spontaneous ecchymosis.  He is status post lap band procedure.  He was seen and evaluated by Dr. Debara Pickett on 06/10/2020.  During that time he return for routine follow-up.  He continued to be overweight.  His weight was 390 pounds.  He was noting dyspnea with minimal exertion.  He denied recurrent episodes of atrial fibrillation.  He was taking flecainide and apixaban.  He was trying to lose weight with dietary approaches.  He was offered referral to healthy weight and wellness and he declined.  He contacted his PCP 03/12/2021 with complaints of increased dyspnea with exertion.  His torsemide was increased.  He was instructed to contact cardiology for further management and evaluation.  He presented to the clinic 03/15/2021 for further evaluation and stated he contacted his PCP on Sunday and indicated that he was having increased work of breathing.  His torsemide was doubled.  He reported that he also saw bariatric surgery 1 month ago and at that time he was about 400 pounds.  He did have some generalized bilateral lower extremity nonpitting edema.  He had a open right shin ulcer and 2 anterior shin scabs that are 5 cm x 2-1/2 cm.  I  refered him to wound care.  Asked him to continue to follow-up with bariatric surgery, follow-up with pulmonology, and  planned repeat  echocardiogram.  I ordered a BMP and CBC.  I  gave him the salty 6 diet sheet, chair exercises, and planned follow-up after his echocardiogram.  He was seen in follow-up by Dr. Debara Pickett on 04/21/2021.  He continues to notice dyspnea.  He was working with pulmonology.  He would develop dyspnea and hypoxia with minimal exertion.  He noted that his oxygen saturation will drop into the 70s with minimal exertion.  His echocardiogram showed normal LV function moderate  pulmonary hypertension and mildly dilated aorta measuring 40 mm.  There were no signs of elevated left heart pressure.  His IVC was considered normal.  He had followed up with pulmonology 12/20 and his BNP was 71.  It was felt that he was euvolemic.  It was felt that his main issue was exercise-induced desaturation.  There was also concern for possibility of shunting.  His follow-up echocardiogram 05/17/2021 showed normal LV function and no shunting was observed.  He presents to the clinic today for follow-up evaluation and states***  Today he denies chest pain,  palpitations, melena, hematuria, hemoptysis, diaphoresis, weakness, presyncope, syncope, orthopnea, and PND.   Home Medications    Prior to Admission medications   Medication Sig Start Date End Date Taking? Authorizing Provider  atorvastatin (LIPITOR) 80 MG tablet TAKE 1 TABLET(80 MG) BY MOUTH DAILY 02/18/21   Janith Lima, MD  [facility] MELATONIN GUMMIES PO Take by mouth.  [provider]  Docusate Sodium (DSS) 100 MG CAPS Take 100 mg by mouth every other day. 07/25/13   Kelvin Cellar, MD  ELIQUIS 5 MG TABS tablet TAKE 1 TABLET(5 MG) BY MOUTH TWICE DAILY 11/29/20   Janith Lima, MD  finasteride (PROSCAR) 5 MG tablet Take 1 tablet (5 mg total) by mouth daily. 07/12/16   Angiulli, Lavon Paganini, PA-C  flecainide (TAMBOCOR) 50 MG tablet Take 1 tablet (50 mg total) by mouth every 12 (twelve) hours. 06/10/20   [name], [name], MD  loratadine (CLARITIN) 10 MG tablet Take 10 mg by mouth daily.    [provider]  LORazepam (ATIVAN) 0.5 MG tablet Take 1 tablet (0.5 mg total) by mouth at bedtime. 02/08/21 02/08/22  Arfeen, Arlyce Harman, MD  methylPREDNISolone (MEDROL DOSEPAK) 4 MG TBPK tablet See admin instructions. follow package directions 08/20/20   [provider]  montelukast (SINGULAIR) 10 MG tablet TAKE 1 TABLET(10 MG) BY MOUTH AT BEDTIME 02/18/21   Janith Lima, MD  Multiple Vitamin (MULTIVITAMIN WITH MINERALS) TABS  tablet Take 1 tablet by mouth daily.    [provider]  ofloxacin (OCUFLOX) 0.3 % ophthalmic solution Place 1 drop into the right eye 4 (four) times daily. 11/03/20   McDonald, Mia A, PA-C  pantoprazole (PROTONIX) 40 MG tablet TAKE 1 TABLET(40 MG) BY MOUTH DAILY 07/28/20   Janith Lima, MD  potassium chloride SA (KLOR-CON) 20 MEQ tablet TAKE 1 TABLET BY MOUTH TWICE DAILY 06/10/20   [name], [name], MD  rOPINIRole (REQUIP) 0.5 MG tablet Take 1 tablet (0.5 mg total) by mouth 3 (three) times daily. 10/26/20   Young, [name], MD  Semaglutide (RYBELSUS) 3 MG TABS Take 3 mg by mouth daily. 02/07/21   Janith Lima, MD  sertraline (ZOLOFT) 100 MG tablet Take 1.5 tablets (150 mg total) by mouth at bedtime. 02/08/21   Arfeen, Arlyce Harman, MD  torsemide (DEMADEX) 20 MG tablet TAKE 3 TABLETS BY MOUTH ONCE DAILY. 06/10/20   [name], [name], MD  traMADol (ULTRAM) 50 MG tablet TAKE 1 TABLET(50 MG) BY MOUTH EVERY 8 HOURS AS NEEDED 01/31/21   Kirsteins, Luanna Salk, MD  traZODone (DESYREL) 50 MG tablet 1 to 3 tabs at bedtime for sleep as needed 07/19/20   Deneise Lever, MD    Family History    Family History  Problem Relation Age of Onset   Depression Mother    Hypertension Mother    Dementia Mother    Heart disease Father        MI   Depression Brother    Stroke Paternal Aunt        CVA   Diabetes Paternal Uncle    Heart disease Paternal Uncle        MI   Heart disease Paternal Grandmother        MI   Diabetes Cousin        PATERNAL   He indicated that his mother is alive. He indicated that his father is deceased. He indicated that both of his brothers are alive. He indicated that the status of his paternal grandmother is unknown. He indicated that his son is alive. He indicated that the status of his paternal aunt is unknown. He indicated that the status of his paternal uncle is unknown. He indicated that the status of his cousin is unknown.   Social History    Social History    Socioeconomic History   Marital status: Married  Spouse name: Not on file   Number of children: 1   Years of education: Not on file   Highest education level: Not on file  Occupational History   Occupation: retired-Manager [employer]  Tobacco Use   Smoking status: Former    Packs/day: 0.20    Years: 13.00    Total pack years: 2.60    Types: Cigarettes    Start date: 04/03/1961    Quit date: 08/30/1974    Years since quitting: 47.3   Smokeless tobacco: Never  Vaping Use   Vaping Use: Never used  Substance and Sexual Activity   Alcohol use: No    Alcohol/week: 0.0 standard drinks of alcohol   Drug use: No   Sexual activity: Not Currently  Other Topics Concern   Not on file  Social History Narrative   Lives at home w/ his wife   Right-handed   Caffeine: decaf coffee, occasional tea   Social Determinants of Health   Financial Resource Strain: Low Risk  (12/16/2020)   Overall Financial Resource Strain (CARDIA)    Difficulty of Paying Living Expenses: Not hard at all  Food Insecurity: No Food Insecurity (12/16/2020)   Hunger Vital Sign    Worried About Running Out of Food in the Last Year: Never true    [place] in the Last Year: Never true  Transportation Needs: No Transportation Needs (12/16/2020)   PRAPARE - Hydrologist (Medical): No    Lack of Transportation (Non-Medical): No  Physical Activity: Inactive (12/16/2020)   Exercise Vital Sign    Days of Exercise per Week: 0 days    Minutes of Exercise per Session: 0 min  Stress: No Stress Concern Present (12/16/2020)   [place] of Stress : Not at all  Social Connections: [place] (12/16/2020)   Social Connection and Isolation Panel [NHANES]    Frequency of Communication with Friends and Family: More than three times a week    Frequency of Social Gatherings with Friends and Family: More than three  times a week    Attends Religious Services: 1 to 4 times per year    Active Member of [employer] or Organizations: Yes    Attends Archivist Meetings: 1 to 4 times per year    Marital Status: Married  Human resources officer Violence: Not At Risk (12/16/2020)   Humiliation, Afraid, Rape, and Kick questionnaire    Fear of Current or Ex-Partner: No    Emotionally Abused: No    Physically Abused: No    Sexually Abused: No     Review of Systems    General:  No chills, fever, night sweats or weight changes.  Cardiovascular:  No chest pain, dyspnea on exertion, edema, orthopnea, palpitations, paroxysmal nocturnal dyspnea. Dermatological: No rash, lesions/masses Respiratory: No cough, dyspnea Urologic: No hematuria, dysuria Abdominal:   No nausea, vomiting, diarrhea, bright red blood per rectum, melena, or hematemesis Neurologic:  No visual changes, wkns, changes in mental status. All other systems reviewed and are otherwise negative except as noted above.  Physical Exam    VS:  There were no vitals taken for this visit. , BMI There is no height or weight on file to calculate BMI. GEN: Well nourished, well developed, in no acute distress. HEENT: normal. Neck: Supple, no JVD, carotid bruits, or masses. Cardiac: RRR, no murmurs, rubs, or gallops. No clubbing, cyanosis, edema.  Radials/DP/PT 2+ and equal  bilaterally.  Respiratory:  Respirations regular and unlabored, clear to auscultation bilaterally. GI: Soft, nontender, nondistended, BS + x 4. MS: no deformity or atrophy. Skin: warm and dry, no rash.***open ulcer 2 and half centimeters by 2 and half centimeters.  Also has 2 healing anterior shin scabs 5 cm x 2-1/2 cm. Neuro:  Strength and sensation are intact. Psych: Normal affect.  Accessory Clinical Findings    Recent Labs: 03/22/2021: Pro B Natriuretic peptide (BNP) 71.0 05/10/2021: BUN 28; Creatinine, Ser 0.97; Platelets 221 05/12/2021: Hemoglobin 12.2; Potassium 3.7; Sodium 148    Recent Lipid Panel    Component Value Date/Time   CHOL 149 11/09/2020 1502   TRIG 139.0 11/09/2020 1502   HDL 44.20 11/09/2020 1502   CHOLHDL 3 11/09/2020 1502   VLDL 27.8 11/09/2020 1502   LDLCALC 77 11/09/2020 1502   LDLCALC 74 10/27/2019 1400   LDLDIRECT 91.0 05/16/2018 1616    ECG personally reviewed by me today-***  EKG 03/15/2021 normal sinus rhythm right bundle branch block T wave abnormality consider inferolateral ischemia 79 bpm- No acute changes  Echocardiogram 10/10/2018 IMPRESSIONS     1. The left ventricle has normal systolic function with an ejection  fraction of 60-65%. The cavity size was normal. Left ventricular diastolic  Doppler parameters are consistent with impaired relaxation.   2. The right ventricle has normal systolic function. The cavity was  normal. There is no increase in right ventricular wall thickness.   3. The mitral valve is grossly normal.   4. No stenosis of the aortic valve.   5. The interatrial septum was not assessed.   Echocardiogram 05/17/2021  IMPRESSIONS     1. Limited study; no subcostal views obtained.   2. Left ventricular ejection fraction, by estimation, is 60 to 65%. The  left ventricle has normal function. The left ventricle has no regional  wall motion abnormalities.   3. Right ventricular systolic function is normal. The right ventricular  size is normal.   4. The mitral valve is normal in structure. No evidence of mitral valve  regurgitation. No evidence of mitral stenosis.   5. The aortic valve has an indeterminant number of cusps. Aortic valve  regurgitation is not visualized. No aortic stenosis is present.   6. Agitated saline contrast bubble study was negative, with no evidence  of any interatrial shunt.  Right heart cath 05/12/2021 Findings:   Resting numbers on 2L O2   Ao = 127/60 (85) LV = 114/15 RA =  11 RV = 46/16 PA = 45/18 (32) PCW = 18 Fick cardiac output/index = 9.2/3.3 PVR = 1.6 WU ABG =   7.4/52/83/96% PA sat = 69%, 70% SVC sat = 69%   Unloaded cycling   PA = 74/39 (43) PCWP = 40 Thermo CO/CI = 12.5/4.5 Sat 95%   10W cycling   PA = 84/37 (52) PCWP = 44 (v waves not prominent) Thermo CO/CI = 11.5/4.1   Sat 81% ABG at peak 7.30/67/55/84%     Assessment: 1. Very mild pulmonary HTN at rest with high cardiac output 2. 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   Recommend: BP control, trial of SGLT2i and weight loss.  Glori Bickers, MD  12:01 PM     Assessment & Plan   1.  DOE, exercise-induced pulmonary hypertension-stable.  Underwent right heart cath 05/12/2021 which showed severe pulmonary venous hypertension with low exercise.  Healthy weight and wellness clinic/bariatric weight center Continue weight loss Continue Jardiance Continue good blood pressure control Heart healthy low-sodium diet Increase physical activity as tolerated  Diastolic CHF-euvolemic.  Continues to lose weight.    Weight today 381***.   Continue torsemide, potassium Repeat BMP Daily weights-contact office with a weight increase of 2-3 pounds overnight or 5 pounds in 1 week Increase physical activity as tolerated   Hyperlipidemia-LDL*** Continue atorvastatin Heart healthy low-sodium high-fiber diet increase physical activity as tolerated   Paroxysmal atrial fibrillation-EKG today shows ***normal sinus rhythm right bundle branch block 79 bpm.  Denies recent episodes of accelerated or irregular heart rate. Continue apixaban, flecainide, Heart healthy low-sodium diet-salty 6 given Increase physical activity as tolerated  Recurrent  pulmonary emboli-no lower extremity swelling.  Compliant with apixaban and has had no bleeding issues.   Follows with PCP  Morbid obesity-weight today 381***.   Encouraged increase physical activity and weight loss Continue diet and increase physical activity  Disposition: Follow-up with Dr. Debara Pickett or me in 6-9 months.   Jossie Ng. Hesston Hitchens NP-C    01/01/2022, 2:04 PM [place] Office [phone] Fax [phone]  Notice: This dictation was prepared with Dragon dictation along with smaller phrase technology. Any transcriptional errors that result from this process are unintentional and may not be corrected upon review.  I spent 14*** minutes examining this patient, reviewing medications, and using patient centered shared decision making involving her cardiac care.  Prior to her visit I spent greater than 20 minutes reviewing her past medical history,  medications, and prior cardiac tests.

## 2022-01-02 ENCOUNTER — Ambulatory Visit: Payer: Medicare Other | Admitting: General Practice

## 2022-01-03 ENCOUNTER — Other Ambulatory Visit: Payer: Self-pay | Admitting: Internal Medicine

## 2022-01-03 DIAGNOSIS — I480 Paroxysmal atrial fibrillation: Secondary | ICD-10-CM

## 2022-01-26 ENCOUNTER — Telehealth: Payer: Self-pay | Admitting: Internal Medicine

## 2022-01-26 MED ORDER — AZITHROMYCIN 250 MG PO TABS: ORAL_TABLET | ORAL | 0 refills | Status: DC

## 2022-01-26 NOTE — Telephone Encounter (Signed)
Called and spoke with patient. He stated that his bipap machine stopped working 4 days ago. It took [employer] 3 days to repair his machine. He noticed that without the bipap machine at night, he developed a productive cough with thick clear phlegm. His SOB and wheezing has remained stable. Denied any fever or sore throat.   He was able to get his bipap machine back yesterday and slept with it last night without any issues. He still has the cough today. Confirmed that he is still using 2L of O2  Denied using any OTC medications as he wanted to check with Dr. Annamaria Boots first.   Dr. Annamaria Boots, can you please advise? Thanks!

## 2022-01-26 NOTE — Telephone Encounter (Signed)
Please send Zpak 250 mg, # 6, 2 today then one daily  He can also use Mucinex-Dm otc

## 2022-01-26 NOTE — Telephone Encounter (Signed)
Called and spoke with patient. He verbalized understanding. RX has been sent to pharmacy.   Nothing further needed at time of call.

## 2022-01-27 ENCOUNTER — Encounter: Payer: Medicare Other | Attending: Physical Medicine & Rehabilitation | Admitting: Physical Medicine & Rehabilitation

## 2022-01-27 ENCOUNTER — Encounter: Payer: Self-pay | Admitting: Physical Medicine & Rehabilitation

## 2022-01-27 VITALS — BP 124/72 | HR 50

## 2022-01-27 DIAGNOSIS — Z5181 Encounter for therapeutic drug level monitoring: Secondary | ICD-10-CM | POA: Diagnosis not present

## 2022-01-27 DIAGNOSIS — G894 Chronic pain syndrome: Secondary | ICD-10-CM | POA: Diagnosis not present

## 2022-01-27 DIAGNOSIS — Z79891 Long term (current) use of opiate analgesic: Secondary | ICD-10-CM | POA: Diagnosis not present

## 2022-01-27 DIAGNOSIS — G463 Brain stem stroke syndrome: Secondary | ICD-10-CM | POA: Diagnosis not present

## 2022-01-27 MED ORDER — TRAMADOL HCL 50 MG PO TABS: ORAL_TABLET | ORAL | 5 refills | Status: DC

## 2022-01-27 NOTE — Progress Notes (Signed)
Subjective:    Patient ID: [name], male    DOB: 02/02/1946, 76 y.o.   MRN: [id number]  HPI 76 yo male with spinal stenosis and chronic low back pain with sciatic pain.  Other past medical history includes morbid obesity, Wallenberg syndrome with residual balance disorder.  He was recently diagnosed with pulmonary artery hypertension and is managed by pulmonology.  His activity tolerance has been steadily declining.  He was told not to overdo it in regards to his pulmonary artery hypertension.  He is now using a scooter outside the home and really does not go anywhere except for MD offices.  Has new walker but does not feel it is sturdy,  Uses a 4 point cane in the home    Pain Inventory Average Pain 6 Pain Right Now 6 My pain is sharp and stabbing  In the last 24 hours, has pain interfered with the following? General activity 6 Relation with others 5 Enjoyment of life 6 What TIME of day is your pain at its worst? daytime Sleep (in general) Fair  Pain is worse with: walking, bending, sitting, and standing Pain improves with: rest, medication, and injections Relief from Meds: 7  Family History  Problem Relation Age of Onset   Depression Mother    Hypertension Mother    Dementia Mother    Heart disease Father        MI   Depression Brother    Stroke Paternal Aunt        CVA   Diabetes Paternal Uncle    Heart disease Paternal Uncle        MI   Heart disease Paternal Grandmother        MI   Diabetes Cousin        PATERNAL   Social History   Socioeconomic History   Marital status: Married    Spouse name: Not on file   Number of children: 1   Years of education: Not on file   Highest education level: Not on file  Occupational History   Occupation: retired-Manager [employer]  Tobacco Use   Smoking status: Former    Packs/day: 0.20    Years: 13.00    Total pack years: 2.60    Types: Cigarettes    Start date: 04/03/1961    Quit date: 08/30/1974    Years  since quitting: 47.4   Smokeless tobacco: Never  Vaping Use   Vaping Use: Never used  Substance and Sexual Activity   Alcohol use: No    Alcohol/week: 0.0 standard drinks of alcohol   Drug use: No   Sexual activity: Not Currently  Other Topics Concern   Not on file  Social History Narrative   Lives at home w/ his wife   Right-handed   Caffeine: decaf coffee, occasional tea   Social Determinants of Health   Financial Resource Strain: Low Risk  (12/16/2020)   Overall Financial Resource Strain (CARDIA)    Difficulty of Paying Living Expenses: Not hard at all  Food Insecurity: No Food Insecurity (12/16/2020)   Hunger Vital Sign    Worried About Running Out of Food in the Last Year: Never true    Ran Out of Food in the Last Year: Never true  Transportation Needs: No Transportation Needs (12/16/2020)   PRAPARE - Hydrologist (Medical): No    Lack of Transportation (Non-Medical): No  Physical Activity: Inactive (12/16/2020)   Exercise Vital Sign    Days of  Exercise per Week: 0 days    Minutes of Exercise per Session: 0 min  Stress: No Stress Concern Present (12/16/2020)   [place]    Feeling of Stress : Not at all  Social Connections: [place] (12/16/2020)   Social Connection and Isolation Panel [NHANES]    Frequency of Communication with Friends and Family: More than three times a week    Frequency of Social Gatherings with Friends and Family: More than three times a week    Attends Religious Services: 1 to 4 times per year    Active Member of Clubs or Organizations: Yes    Attends Archivist Meetings: 1 to 4 times per year    Marital Status: Married   Past Surgical History:  Procedure Laterality Date   ARTHROSCOPY KNEE W/ DRILLING     BREATH TEK H PYLORI N/A 12/23/2012   Procedure: [place];  Surgeon: Gayland Curry, MD;  Location: [facility];  Service:  General;  Laterality: N/A;   [place] N/A 07/31/2013   Procedure: COLONOSCOPY;  Surgeon: Gatha Mayer, MD;  Location: [place];  Service: Endoscopy;  Laterality: N/A;   ESOPHAGOGASTRODUODENOSCOPY N/A 07/22/2013   Procedure: ESOPHAGOGASTRODUODENOSCOPY (EGD);  Surgeon: Irene Shipper, MD;  [facility];  Service: Endoscopy;  Laterality: N/A;   ESOPHAGOGASTRODUODENOSCOPY N/A 07/31/2013   Procedure: ESOPHAGOGASTRODUODENOSCOPY (EGD);  Surgeon: Gatha Mayer, MD;  Location: [facility] ENDOSCOPY;  Service: Endoscopy;  Laterality: N/A;   INTRAOPERATIVE TRANSESOPHAGEAL ECHOCARDIOGRAM N/A 07/18/2013   Procedure: INTRAOPERATIVE TRANSESOPHAGEAL ECHOCARDIOGRAM;  Surgeon: Grace Isaac, MD;  Location: [place];  Service: Open Heart Surgery;  Laterality: N/A;   LAPAROSCOPIC GASTRIC BANDING WITH HIATAL HERNIA REPAIR N/A 04/08/2013   Procedure: LAPAROSCOPIC GASTRIC BANDING WITH possible  HIATAL HERNIA REPAIR;  Surgeon: Gayland Curry, MD;  Location: [facility];  Service: General;  Laterality: N/A;   LITHOTRIPSY     renal calculi     RIGHT AND LEFT HEART CATH N/A 05/12/2021   Procedure: RIGHT AND LEFT HEART CATH;  Surgeon: Jolaine Artist, MD;  Location: [place] [facility];  Service: Cardiovascular;  Laterality: N/A;   SUBXYPHOID PERICARDIAL WINDOW N/A 07/18/2013   Procedure: SUBXYPHOID PERICARDIAL WINDOW;  Surgeon: Grace Isaac, MD;  Location: [place];  Service: Thoracic;  Laterality: N/A;   TONSILLECTOMY     total knee replacment     bilat   trigger finger repair     also lue, cts surgically repaired   WISDOM TOOTH EXTRACTION     Past Surgical History:  Procedure Laterality Date   ARTHROSCOPY KNEE W/ DRILLING     BREATH TEK H PYLORI N/A 12/23/2012   Procedure: BREATH TEK Kandis Ban;  Surgeon: Gayland Curry, MD;  Location: [facility];  Service: General;  Laterality: N/A;   CHOLECYSTECTOMY  1989   COLONOSCOPY N/A 07/31/2013   Procedure: COLONOSCOPY;  Surgeon: Gatha Mayer,  MD;  Location: [place];  Service: Endoscopy;  Laterality: N/A;   ESOPHAGOGASTRODUODENOSCOPY N/A 07/22/2013   Procedure: ESOPHAGOGASTRODUODENOSCOPY (EGD);  Surgeon: Irene Shipper, MD;  Location: [facility] ENDOSCOPY;  Service: Endoscopy;  Laterality: N/A;   ESOPHAGOGASTRODUODENOSCOPY N/A 07/31/2013   Procedure: ESOPHAGOGASTRODUODENOSCOPY (EGD);  Surgeon: Gatha Mayer, MD;  Location: [facility] ENDOSCOPY;  Service: Endoscopy;  Laterality: N/A;   INTRAOPERATIVE TRANSESOPHAGEAL ECHOCARDIOGRAM N/A 07/18/2013   Procedure: INTRAOPERATIVE TRANSESOPHAGEAL ECHOCARDIOGRAM;  Surgeon: Grace Isaac, MD;  Location: [place];  Service:  Open Heart Surgery;  Laterality: N/A;   LAPAROSCOPIC GASTRIC BANDING WITH HIATAL HERNIA REPAIR N/A 04/08/2013   Procedure: LAPAROSCOPIC GASTRIC BANDING WITH possible  HIATAL HERNIA REPAIR;  Surgeon: Gayland Curry, MD;  Location: [facility];  Service: General;  Laterality: N/A;   LITHOTRIPSY     renal calculi     RIGHT AND LEFT HEART CATH N/A 05/12/2021   Procedure: RIGHT AND LEFT HEART CATH;  Surgeon: Jolaine Artist, MD;  Location: [place] [facility];  Service: Cardiovascular;  Laterality: N/A;   SUBXYPHOID PERICARDIAL WINDOW N/A 07/18/2013   Procedure: SUBXYPHOID PERICARDIAL WINDOW;  Surgeon: Grace Isaac, MD;  Location: [facility];  Service: Thoracic;  Laterality: N/A;   TONSILLECTOMY     total knee replacment     bilat   trigger finger repair     also lue, cts surgically repaired   WISDOM TOOTH EXTRACTION     Past Medical History:  Diagnosis Date   Anemia 07/29/2013   Asthma    childhood asthma   Benign essential HTN    Benign prostatic hypertrophy    several prostate biopsies neg for cancer.    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   FH: colonic polyps 2010   Gout    History of kidney stones    Hypertension    Kidney cysts    Morbid obesity (HCC)    Numbness    feet / legs   PAF (paroxysmal atrial fibrillation) with RVR 07/29/2013   Pericarditis 2015   s/p window   Recurrent pulmonary emboli (HCC) 06/13/2015   Sleep apnea    USES C PAP   Stroke syndrome    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   BP 124/72   Pulse (!) 50   SpO2 97%   Opioid Risk Score:   Fall Risk Score:  `1  Depression screen PHQ 2/9     05/24/2021    2:11 PM 12/16/2020    4:15 PM 12/07/2020    1:19 PM 11/09/2020    2:11 PM 05/12/2020    1:54 PM 10/27/2019    3:20 PM 10/27/2019    3:14 PM  Depression screen PHQ 2/9  Decreased Interest 1 0 1 0 0 0 0  Down, Depressed, Hopeless 1 0 1 0 0 0 0  PHQ - 2 Score 2 0 2 0 0 0 0  Altered sleeping 0   0 0    Tired, decreased energy 0   0 0    Change in appetite 0   0 0    Feeling bad or failure about yourself  0   0 0    Trouble concentrating 0   0 0    Moving slowly or fidgety/restless 0   0 0    Suicidal thoughts 0   0 0    PHQ-9 Score 2   0 0        Review of Systems  Musculoskeletal:  Positive for back pain.  All other systems reviewed and are negative.     Objective:   Physical Exam Vitals reviewed.  Constitutional:      Appearance: He is obese.  HENT:  Head: Normocephalic and atraumatic.  Eyes:     Extraocular Movements: Extraocular movements intact.     Conjunctiva/sclera: Conjunctivae normal.     Pupils: Pupils are equal, round, and reactive to light.  Musculoskeletal:     Comments: Patient is examined while sitting in his scooter chair he can extend his right leg fully with normal strength ankle dorsiflexors normal left knee has normal knee extensor strength with normal antigen gravity ankle dorsiflexor strength. Knees show no evidence of effusion no erythema. Skin demonstrates pretibial stasis  dermatitis bilaterally.  Skin:    General: Skin is warm and dry.     Comments: No open areas over the stasis dermatitis pretibial.  No erythema  Neurological:     Mental Status: He is alert and oriented to person, place, and time.  Psychiatric:        Mood and Affect: Mood normal.        Behavior: Behavior normal.           Assessment & Plan:   #1.  History of spinal stenosis at this point he is more limited by his pulmonary artery hypertension and general debility then by spinal stenosis claudication symptoms.  He will follow-up with pulmonary We discussed elevating the legs for his stasis dermatitis and possibly trying Lac-Hydrin to help with some exfoliation We will continue tramadol which is at least helping him with his transfers and ambulation in the home environment. Return to clinic 6 months with nurse practitioner

## 2022-01-27 NOTE — Patient Instructions (Signed)
Try using Lac Hytrin lotion to your legs  Elevate legs as much as possible

## 2022-01-31 ENCOUNTER — Other Ambulatory Visit: Payer: Self-pay | Admitting: Internal Medicine

## 2022-01-31 DIAGNOSIS — I480 Paroxysmal atrial fibrillation: Secondary | ICD-10-CM

## 2022-02-01 LAB — DRUG TOX MONITOR 1 W/CONF, ORAL FLD
Amphetamines: NEGATIVE ng/mL (ref <10)
Barbiturates: NEGATIVE ng/mL (ref <10)
Benzodiazepines: NEGATIVE ng/mL (ref <0.50)
Buprenorphine: NEGATIVE ng/mL (ref <0.10)
Cocaine: NEGATIVE ng/mL (ref <5.0)
Fentanyl: NEGATIVE ng/mL (ref <0.10)
Heroin Metabolite: NEGATIVE ng/mL (ref <1.0)
MARIJUANA: NEGATIVE ng/mL (ref <2.5)
MDMA: NEGATIVE ng/mL (ref <10)
Meprobamate: NEGATIVE ng/mL (ref <2.5)
Methadone: NEGATIVE ng/mL (ref <5.0)
Nicotine Metabolite: NEGATIVE ng/mL (ref <5.0)
Opiates: NEGATIVE ng/mL (ref <2.5)
Phencyclidine: NEGATIVE ng/mL (ref <10)
Tapentadol: NEGATIVE ng/mL (ref <5.0)
Tramadol: >500 ng/mL — ABNORMAL HIGH (ref <5.0)
Tramadol: POSITIVE ng/mL — AB (ref <5.0)
Zolpidem: NEGATIVE ng/mL (ref <5.0)

## 2022-02-01 LAB — DRUG TOX ALC METAB W/CON, ORAL FLD: Alcohol Metabolite: NEGATIVE ng/mL (ref <25)

## 2022-02-02 ENCOUNTER — Other Ambulatory Visit: Payer: Self-pay | Admitting: Internal Medicine

## 2022-02-02 NOTE — Progress Notes (Deleted)
Cardiology Clinic Note   Patient Name: [name] Date of Encounter: 02/02/2022  Primary Care Provider:  Janith Lima, MD Primary Cardiologist:  Pixie Casino, MD  Patient Profile    [name] presents to the clinic today for follow-up evaluation of his  DOE.  Past Medical History    Past Medical History:  Diagnosis Date   Anemia 07/29/2013   Asthma    childhood asthma   Benign essential HTN    Benign prostatic hypertrophy    several prostate biopsies neg for cancer.    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   FH: colonic polyps 2010   Gout    History of kidney stones    Hypertension    Kidney cysts    Morbid obesity ([place])    Numbness    feet / legs   PAF (paroxysmal atrial fibrillation) with RVR 07/29/2013   Pericarditis 2015   s/p window   Recurrent pulmonary emboli ([place]) 06/13/2015   Sleep apnea    USES C PAP   Stroke syndrome    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   Past Surgical History:  Procedure Laterality Date   ARTHROSCOPY KNEE W/ DRILLING     BREATH TEK H PYLORI N/A 12/23/2012   Procedure: BREATH TEK H PYLORI;  Surgeon: Gayland Curry, MD;  Location: [facility];  Service: General;  Laterality: N/A;   [place] N/A 07/31/2013   Procedure: COLONOSCOPY;  Surgeon: Gatha Mayer, MD;  Location: [place];  Service: Endoscopy;  Laterality: N/A;   ESOPHAGOGASTRODUODENOSCOPY N/A 07/22/2013   Procedure: ESOPHAGOGASTRODUODENOSCOPY (EGD);  Surgeon: Irene Shipper, MD;  Location: [facility] ENDOSCOPY;  Service: Endoscopy;  Laterality: N/A;   ESOPHAGOGASTRODUODENOSCOPY N/A 07/31/2013   Procedure:  ESOPHAGOGASTRODUODENOSCOPY (EGD);  Surgeon: Gatha Mayer, MD;  Location[facility] END[facility]COPY;  Service: Endoscopy;  Laterality: N/A;   INTRAOPERATIVE TRANSESOPHAGEAL ECHOCARDIOGRAM N/A 07/18/2013   Procedure: INTRAOPERATIVE TRANSESOPHAGEAL ECHOCARDIOGRAM;  Surgeon: Grace Isaac, MD;  Location: [place];  Service: Open Heart Surgery;  Laterality: N/A;   LAPAROSCOPIC GASTRIC BANDING WITH HIATAL HERNIA REPAIR N/A 04/08/2013   Procedure: LAPAROSCOPIC GASTRIC BANDING WITH possible  HIATAL HERNIA REPAIR;  Surgeon: Gayland Curry, MD;  Location: [facility] ORS;  Service: General;  Laterality: N/A;   LITHOTRIPSY     renal calculi     RIGHT AND LEFT HEART CATH N/A 05/12/2021   Procedure: RIGHT AND LEFT HEART CATH;  Surgeon: Jolaine Artist, MD;  Location: [place] [facility];  Service: Cardiovascular;  Laterality: N/A;   SUBXYPHOID PERICARDIAL WINDOW N/A 07/18/2013   Procedure: SUBXYPHOID PERICARDIAL WINDOW;  Surgeon: Grace Isaac, MD;  Location: [facility] OR;  Service: Thoracic;  Laterality: N/A;   TONSILLECTOMY     total knee replacment     bilat   trigger finger repair     also lue, cts surgically repaired   WISDOM TOOTH EXTRACTION      Allergies  Allergies  Allergen Reactions   Morphine Nausea And Vomiting and Other (See Comments)    Severe hallucinations Other reaction(s): Hallucinations   Penicillins Other (See Comments)    Passed out  after injection Did it involve swelling of the face/tongue/throat, SOB, or low BP? Unknown Did it involve sudden or severe rash/hives, skin peeling, or any reaction on the inside of your mouth or nose? No Did you need to seek medical attention at a hospital or doctor's office? Yes When did it last happen?    teenager   If all above answers are "NO", may proceed with cephalosporin use. Other reaction(s): Dizziness, Unknown   Codeine Nausea Only   Propoxyphene N-Acetaminophen Nausea Only    History of Present Illness    [name] has a PMH of OSA, GERD,  dysphagia poststroke, type 2 diabetes, peripheral neuropathy, BPH, morbid obesity, paroxysmal atrial fibrillation, diastolic CHF, recurrent pulmonary emboli, essential hypertension, and spontaneous ecchymosis.  He is status post lap band procedure.  He was seen and evaluated by Dr. Debara Pickett on 06/10/2020.  During that time he return for routine follow-up.  He continued to be overweight.  His weight was 390 pounds.  He was noting dyspnea with minimal exertion.  He denied recurrent episodes of atrial fibrillation.  He was taking flecainide and apixaban.  He was trying to lose weight with dietary approaches.  He was offered referral to healthy weight and wellness and he declined.  He contacted his PCP 03/12/2021 with complaints of increased dyspnea with exertion.  His torsemide was increased.  He was instructed to contact cardiology for further management and evaluation.  He presented to the clinic on 03/15/2021 for further evaluation as stated he contacted his PCP on Sunday and indicated that he was having increased work of breathing.  His torsemide was doubled.  He reported that he also saw bariatric surgery 1 month previous and at that time weighed around 400 pounds.  He was noted to have some generalized bilateral lower extremity nonpitting edema.  He had an open right shin ulcer and 2 anterior shin scabs that were 5 cm x 2-1/2 sooner.  I referred him to the wound clinic.  I asked him to continue to follow-up with bariatric surgery, follow-up with pulmonology, and plan to repeat echocardiogram.  I ordered a BMP and CBC.  I gave him a salty 6 diet sheet, chair exercises, and plan follow-up after his echocardiogram.  He was seen in follow-up by Dr. Debara Pickett on 04/21/2021.  During that time he continued to notice dyspnea.  He was working with pulmonology.  He would develop dyspnea and hypoxia with minimal exertion.  He was noted to have oxygen saturations that were dropping to the 70s with minimal exertion.  His  echocardiogram showed normal LV function, moderate pulmonary hypertension, and mildly dilated aorta measuring 40 mm.  There was no sign of elevated left heart pressures.  His IVC was considered normal.  He followed up with pulmonology 12/20 and his BNP was 71.  He was felt to be euvolemic.  It was felt that his main issue was exercise-induced desaturation.  There was also concern for possibility of shunting.  His follow-up cardiogram 05/17/2021 showed normal LV function and no shunting was observed.  He presents to the clinic today for follow-up evaluation states***  Today he denies chest pain,  palpitations, melena, hematuria, hemoptysis, diaphoresis, weakness, presyncope, syncope, orthopnea, and PND.   Home Medications    Prior to Admission medications   Medication Sig Start Date End Date Taking? Authorizing Provider  atorvastatin (LIPITOR) 80 MG tablet TAKE 1 TABLET(80 MG) BY MOUTH DAILY 02/18/21   Janith Lima, MD  [facility] MELATONIN GUMMIES PO Take  by mouth.    [provider]  Docusate Sodium (DSS) 100 MG CAPS Take 100 mg by mouth every other day. 07/25/13   Kelvin Cellar, MD  ELIQUIS 5 MG TABS tablet TAKE 1 TABLET(5 MG) BY MOUTH TWICE DAILY 11/29/20   Janith Lima, MD  finasteride (PROSCAR) 5 MG tablet Take 1 tablet (5 mg total) by mouth daily. 07/12/16   Angiulli, Lavon Paganini, PA-C  flecainide (TAMBOCOR) 50 MG tablet Take 1 tablet (50 mg total) by mouth every 12 (twelve) hours. 06/10/20   [name], [name], MD  loratadine (CLARITIN) 10 MG tablet Take 10 mg by mouth daily.    [provider]  LORazepam (ATIVAN) 0.5 MG tablet Take 1 tablet (0.5 mg total) by mouth at bedtime. 02/08/21 02/08/22  Arfeen, Arlyce Harman, MD  methylPREDNISolone (MEDROL DOSEPAK) 4 MG TBPK tablet See admin instructions. follow package directions 08/20/20   [provider]  montelukast (SINGULAIR) 10 MG tablet TAKE 1 TABLET(10 MG) BY MOUTH AT BEDTIME 02/18/21   Janith Lima, MD  Multiple Vitamin  (MULTIVITAMIN WITH MINERALS) TABS tablet Take 1 tablet by mouth daily.    [provider]  ofloxacin (OCUFLOX) 0.3 % ophthalmic solution Place 1 drop into the right eye 4 (four) times daily. 11/03/20   McDonald, Mia A, PA-C  pantoprazole (PROTONIX) 40 MG tablet TAKE 1 TABLET(40 MG) BY MOUTH DAILY 07/28/20   Janith Lima, MD  potassium chloride SA (KLOR-CON) 20 MEQ tablet TAKE 1 TABLET BY MOUTH TWICE DAILY 06/10/20   [name], [name], MD  rOPINIRole (REQUIP) 0.5 MG tablet Take 1 tablet (0.5 mg total) by mouth 3 (three) times daily. 10/26/20   Young, [name], MD  Semaglutide (RYBELSUS) 3 MG TABS Take 3 mg by mouth daily. 02/07/21   Janith Lima, MD  sertraline (ZOLOFT) 100 MG tablet Take 1.5 tablets (150 mg total) by mouth at bedtime. 02/08/21   Arfeen, Arlyce Harman, MD  torsemide (DEMADEX) 20 MG tablet TAKE 3 TABLETS BY MOUTH ONCE DAILY. 06/10/20   [name], [name], MD  traMADol (ULTRAM) 50 MG tablet TAKE 1 TABLET(50 MG) BY MOUTH EVERY 8 HOURS AS NEEDED 01/31/21   Kirsteins, Luanna Salk, MD  traZODone (DESYREL) 50 MG tablet 1 to 3 tabs at bedtime for sleep as needed 07/19/20   Deneise Lever, MD    Family History    Family History  Problem Relation Age of Onset   Depression Mother    Hypertension Mother    Dementia Mother    Heart disease Father        MI   Depression Brother    Stroke Paternal Aunt        CVA   Diabetes Paternal Uncle    Heart disease Paternal Uncle        MI   Heart disease Paternal Grandmother        MI   Diabetes Cousin        PATERNAL   He indicated that his mother is alive. He indicated that his father is deceased. He indicated that both of his brothers are alive. He indicated that the status of his paternal grandmother is unknown. He indicated that his son is alive. He indicated that the status of his paternal aunt is unknown. He indicated that the status of his paternal uncle is unknown. He indicated that the status of his cousin is unknown.   Social  History    Social History   Socioeconomic History  Marital status: Married    Spouse name: Not on file   Number of children: 1   Years of education: Not on file   Highest education level: Not on file  Occupational History   Occupation: retired-Manager [employer]  Tobacco Use   Smoking status: Former    Packs/day: 0.20    Years: 13.00    Total pack years: 2.60    Types: Cigarettes    Start date: 04/03/1961    Quit date: 08/30/1974    Years since quitting: 47.4   Smokeless tobacco: Never  Vaping Use   Vaping Use: Never used  Substance and Sexual Activity   Alcohol use: No    Alcohol/week: 0.0 standard drinks of alcohol   Drug use: No   Sexual activity: Not Currently  Other Topics Concern   Not on file  Social History Narrative   Lives at home w/ his wife   Right-handed   Caffeine: decaf coffee, occasional tea   Social Determinants of Health   Financial Resource Strain: Low Risk  (12/16/2020)   Overall Financial Resource Strain (CARDIA)    Difficulty of Paying Living Expenses: Not hard at all  Food Insecurity: No Food Insecurity (12/16/2020)   Hunger Vital Sign    Worried About Running Out of Food in the Last Year: Never true    [place] in the Last Year: Never true  Transportation Needs: No Transportation Needs (12/16/2020)   PRAPARE - Hydrologist (Medical): No    Lack of Transportation (Non-Medical): No  Physical Activity: Inactive (12/16/2020)   Exercise Vital Sign    Days of Exercise per Week: 0 days    Minutes of Exercise per Session: 0 min  Stress: No Stress Concern Present (12/16/2020)   [place] of Stress : Not at all  Social Connections: [place] (12/16/2020)   Social Connection and Isolation Panel [NHANES]    Frequency of Communication with Friends and Family: More than three times a week    Frequency of Social Gatherings with Friends  and Family: More than three times a week    Attends Religious Services: 1 to 4 times per year    Active Member of [employer] or Organizations: Yes    Attends Archivist Meetings: 1 to 4 times per year    Marital Status: Married  Human resources officer Violence: Not At Risk (12/16/2020)   Humiliation, Afraid, Rape, and Kick questionnaire    Fear of Current or Ex-Partner: No    Emotionally Abused: No    Physically Abused: No    Sexually Abused: No     Review of Systems    General:  No chills, fever, night sweats or weight changes.  Cardiovascular:  No chest pain, dyspnea on exertion, edema, orthopnea, palpitations, paroxysmal nocturnal dyspnea. Dermatological: No rash, lesions/masses Respiratory: No cough, dyspnea Urologic: No hematuria, dysuria Abdominal:   No nausea, vomiting, diarrhea, bright red blood per rectum, melena, or hematemesis Neurologic:  No visual changes, wkns, changes in mental status. All other systems reviewed and are otherwise negative except as noted above.  Physical Exam    VS:  There were no vitals taken for this visit. , BMI There is no height or weight on file to calculate BMI. GEN: Well nourished, well developed, in no acute distress. HEENT: normal. Neck: Supple, no JVD, carotid bruits, or masses. Cardiac: RRR, no murmurs, rubs, or gallops. No clubbing, cyanosis,  edema.  Radials/DP/PT 2+ and equal bilaterally.  Respiratory:  Respirations regular and unlabored, clear to auscultation bilaterally. GI: Soft, nontender, nondistended, BS + x 4. MS: no deformity or atrophy. Skin: warm and dry, no rash.***open ulcer 2 and half centimeters by 2 and half centimeters.  Also has 2 healing anterior shin scabs 5 cm x 2-1/2 cm. Neuro:  Strength and sensation are intact. Psych: Normal affect.  Accessory Clinical Findings    Recent Labs: 03/22/2021: Pro B Natriuretic peptide (BNP) 71.0 05/10/2021: BUN 28; Creatinine, Ser 0.97; Platelets 221 05/12/2021: Hemoglobin 12.2;  Potassium 3.7; Sodium 148   Recent Lipid Panel    Component Value Date/Time   CHOL 149 11/09/2020 1502   TRIG 139.0 11/09/2020 1502   HDL 44.20 11/09/2020 1502   CHOLHDL 3 11/09/2020 1502   VLDL 27.8 11/09/2020 1502   LDLCALC 77 11/09/2020 1502   LDLCALC 74 10/27/2019 1400   LDLDIRECT 91.0 05/16/2018 1616    ECG personally reviewed by me today-normal sinus rhythm right bundle branch block T wave abnormality consider inferolateral ischemia 79 bpm- No acute changes  Echocardiogram 10/10/2018 IMPRESSIONS     1. The left ventricle has normal systolic function with an ejection  fraction of 60-65%. The cavity size was normal. Left ventricular diastolic  Doppler parameters are consistent with impaired relaxation.   2. The right ventricle has normal systolic function. The cavity was  normal. There is no increase in right ventricular wall thickness.   3. The mitral valve is grossly normal.   4. No stenosis of the aortic valve.   5. The interatrial septum was not assessed.   Echocardiogram 05/17/2021  IMPRESSIONS     1. Limited study; no subcostal views obtained.   2. Left ventricular ejection fraction, by estimation, is 60 to 65%. The  left ventricle has normal function. The left ventricle has no regional  wall motion abnormalities.   3. Right ventricular systolic function is normal. The right ventricular  size is normal.   4. The mitral valve is normal in structure. No evidence of mitral valve  regurgitation. No evidence of mitral stenosis.   5. The aortic valve has an indeterminant number of cusps. Aortic valve  regurgitation is not visualized. No aortic stenosis is present.   6. Agitated saline contrast bubble study was negative, with no evidence  of any interatrial shunt.    Right heart cath 05/12/2021  Resting numbers on 2L O2   Ao = 127/60 (85) LV = 114/15 RA =  11 RV = 46/16 PA = 45/18 (32) PCW = 18 Fick cardiac output/index = 9.2/3.3 PVR = 1.6 WU ABG =   7.4/52/83/96% PA sat = 69%, 70% SVC sat = 69%   Unloaded cycling   PA = 74/39 (43) PCWP = 40 Thermo CO/CI = 12.5/4.5 Sat 95%   10W cycling   PA = 84/37 (52) PCWP = 44 (v waves not prominent) Thermo CO/CI = 11.5/4.1   Sat 81% ABG at peak 7.30/67/55/84%     Assessment: 1. Very mild pulmonary HTN at rest with high cardiac output 2. 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   Recommend: BP control, trial of SGLT2i and weight loss.  Glori Bickers, MD  12:01 PM  Assessment & Plan   1.  DOE, exercise-induced pulmonary hypertension-stable.***  Underwent right heart cath 05/12/2021 which showed severe pulmonary venous hypertension with low exercise.    DOE previously felt to be related to weight. Continue weight loss Continue Jardiance Continue good blood pressure control Healthy weight and wellness clinic/bariatric weight center Heart healthy low-sodium diet Increase physical activity as tolerated  Diastolic CHF-euvolemic.  Continue weight loss.  Weight today ***. Continue torsemide, potassium Repeat BMP Daily weights-contact office with a weight increase of 3 pounds overnight or 5 pounds in 1 week Increase physical activity as tolerated Repeat echocardiogram  Hyperlipidemia-LDL*** Continue atorvastatin Heart healthy low-sodium high-fiber diet increase physical activity as tolerated  Paroxysmal atrial fibrillation-EKG today shows*** normal sinus rhythm right bundle branch block 79 bpm.  Denies recent episodes of palpitations.  Continue apixaban, flecainide, Heart healthy low-sodium diet Increase physical activity as  tolerated  Recurrent pulmonary emboli-no lower extremity edema.  Compliant apixaban and reports compliance.  Denies bleeding issues. Order CBC  Morbid obesity-weight today 381.   Encouraged increase physical activity and weight loss Continue increase physical activity and strict diet.  Disposition: Follow-up with Dr. Debara Pickett or me 6-9 months.   Jossie Ng. Damian Hofstra NP-C    02/02/2022, 6:58 AM [place] [address] Office [phone] Fax [phone]  Notice: This dictation was prepared with Dragon dictation along with smaller phrase technology. Any transcriptional errors that result from this process are unintentional and may not be corrected upon review.  I spent 14*** minutes examining this patient, reviewing medications, and using patient centered shared decision making involving her cardiac care.  Prior to her visit I spent greater than 20 minutes reviewing her past medical history,  medications, and prior cardiac tests.

## 2022-02-03 ENCOUNTER — Ambulatory Visit: Payer: Medicare Other | Admitting: General Practice

## 2022-02-06 ENCOUNTER — Telehealth (HOSPITAL_BASED_OUTPATIENT_CLINIC_OR_DEPARTMENT_OTHER): Payer: Medicare Other | Admitting: Psychiatry

## 2022-02-06 ENCOUNTER — Encounter (HOSPITAL_COMMUNITY): Payer: Self-pay | Admitting: Psychiatry

## 2022-02-06 DIAGNOSIS — F419 Anxiety disorder, unspecified: Secondary | ICD-10-CM | POA: Diagnosis not present

## 2022-02-06 DIAGNOSIS — F330 Major depressive disorder, recurrent, mild: Secondary | ICD-10-CM | POA: Diagnosis not present

## 2022-02-06 MED ORDER — LORAZEPAM 0.5 MG PO TABS: 0.5000 mg | ORAL_TABLET | Freq: Every day | ORAL | 1 refills | Status: DC

## 2022-02-06 MED ORDER — SERTRALINE HCL 100 MG PO TABS: 150.0000 mg | ORAL_TABLET | Freq: Every day | ORAL | 1 refills | Status: DC

## 2022-02-06 NOTE — Progress Notes (Signed)
Virtual Visit via Telephone Note  I connected with [name] on 02/06/22 at  2:20 PM EST by telephone and verified that I am speaking with the correct person using two identifiers.  Location: Patient: Home Provider: Home Office   I discussed the limitations, risks, security and privacy concerns of performing an evaluation and management service by telephone and the availability of in person appointments. I also discussed with the patient that there may be a patient responsible charge related to this service. The patient expressed understanding and agreed to proceed.   History of Present Illness: Patient is evaluated by phone session.  He is concerned about his general health decline.  He does not leave the house because he is afraid that he may have a fall or dizziness.  He uses cane, oxygen and at night he uses BiPAP.  He gets shortness of breath.  He reported sadness because has not seen his grandchild in person for a few months but had to face time on a regular basis.  Patient told his son lives close by but he is afraid to visit him because he get dizzy.  His wife is very supportive.  He denies any crying spells or any feeling of hopelessness or worthlessness.  He is compliant with Ativan and Zoloft.  His appetite is fair.  His energy level is fair.  He has no tremors or shakes.  He denies any panic attack and like to keep his current medication.  He is not interested in therapy.     Past Psychiatric History: Reviewed H/O inpatient in 2006 for depression and having suicidal thoughts. Took Risperdal but d/c after stroke in March 2018.  Psychiatric Specialty Exam: Physical Exam  Review of Systems  Constitutional:  Positive for fatigue.  Respiratory:         On oxygen  Neurological:        Uses cane to help walking    Weight (!) 376 lb (170.6 kg).There is no height or weight on file to calculate BMI.  General Appearance: NA  Eye Contact:  NA  Speech:  Slow  Volume:  Decreased   Mood:  Dysphoric  Affect:  NA  Thought Process:  Goal Directed  Orientation:  Full (Time, Place, and Person)  Thought Content:  Rumination  Suicidal Thoughts:  No  Homicidal Thoughts:  No  Memory:  Immediate;   Good Recent;   Fair Remote;   Fair  Judgement:  Intact  Insight:  NA  Psychomotor Activity:  NA  Concentration:  Concentration: Fair and Attention Span: Fair  Recall:  Good  Fund of Knowledge:  Good  Language:  Fair  Akathisia:  No  Handed:  Right  AIMS (if indicated):     Assets:  Communication Skills Desire for Improvement Housing Social Support  ADL's:  Intact  Cognition:  WNL  Sleep:   fair with BIPAP      Assessment and Plan:   Major depressive disorder, recurrent.  Anxiety.  Patient is reporting sadness because of fatigue, lack of mobility and health issues.  However he feels the current medicine is working and does not want to change.  He is not interested in therapy.  Continue Zoloft 150 mg daily and Ativan 0.5 mg at bedtime.  I offered follow-up in 6 months and he agreed.  I recommend to call us back sooner if he has any question, concern or need an earlier appointment.  Follow Up Instructions:    I discussed the assessment and  treatment plan with the patient. The patient was provided an opportunity to ask questions and all were answered. The patient agreed with the plan and demonstrated an understanding of the instructions.   The patient was advised to call back or seek an in-person evaluation if the symptoms worsen or if the condition fails to improve as anticipated.  Collaboration of Care: Other provider involved in patient's care AEB notes are available in epic to review.  Patient/Guardian was advised Release of Information must be obtained prior to any record release in order to collaborate their care with an outside provider. Patient/Guardian was advised if they have not already done so to contact the registration department to sign all necessary  forms in order for [place] to release information regarding their care.   Consent: Patient/Guardian gives verbal consent for treatment and assignment of benefits for services provided during this visit. Patient/Guardian expressed understanding and agreed to proceed.    I provided 20 minutes of non-face-to-face time during this encounter.   Kathlee Nations, MD

## 2022-02-07 ENCOUNTER — Encounter: Payer: Self-pay | Admitting: Internal Medicine

## 2022-02-07 ENCOUNTER — Ambulatory Visit (INDEPENDENT_AMBULATORY_CARE_PROVIDER_SITE_OTHER): Payer: Medicare Other | Admitting: Internal Medicine

## 2022-02-07 VITALS — BP 124/86 | HR 61 | Temp 99.1°F | Ht 72.0 in

## 2022-02-07 DIAGNOSIS — I480 Paroxysmal atrial fibrillation: Secondary | ICD-10-CM

## 2022-02-07 DIAGNOSIS — G463 Brain stem stroke syndrome: Secondary | ICD-10-CM

## 2022-02-07 DIAGNOSIS — E118 Type 2 diabetes mellitus with unspecified complications: Secondary | ICD-10-CM

## 2022-02-07 DIAGNOSIS — L97411 Non-pressure chronic ulcer of right heel and midfoot limited to breakdown of skin: Secondary | ICD-10-CM | POA: Diagnosis not present

## 2022-02-07 DIAGNOSIS — Z23 Encounter for immunization: Secondary | ICD-10-CM | POA: Diagnosis not present

## 2022-02-07 DIAGNOSIS — E785 Hyperlipidemia, unspecified: Secondary | ICD-10-CM

## 2022-02-07 DIAGNOSIS — I10 Essential (primary) hypertension: Secondary | ICD-10-CM | POA: Diagnosis not present

## 2022-02-07 DIAGNOSIS — D538 Other specified nutritional anemias: Secondary | ICD-10-CM | POA: Diagnosis not present

## 2022-02-07 DIAGNOSIS — L03115 Cellulitis of right lower limb: Secondary | ICD-10-CM | POA: Diagnosis not present

## 2022-02-07 DIAGNOSIS — E538 Deficiency of other specified B group vitamins: Secondary | ICD-10-CM

## 2022-02-07 DIAGNOSIS — E11621 Type 2 diabetes mellitus with foot ulcer: Secondary | ICD-10-CM | POA: Diagnosis not present

## 2022-02-07 LAB — HEPATIC FUNCTION PANEL
ALT: 19 U/L (ref 0–53)
AST: 23 U/L (ref 0–37)
Albumin: 3.2 g/dL — ABNORMAL LOW (ref 3.5–5.2)
Alkaline Phosphatase: 142 U/L — ABNORMAL HIGH (ref 39–117)
Bilirubin, Direct: 0.2 mg/dL (ref 0.0–0.3)
Total Bilirubin: 0.7 mg/dL (ref 0.2–1.2)
Total Protein: 7.1 g/dL (ref 6.0–8.3)

## 2022-02-07 LAB — LIPID PANEL
Cholesterol: 99 mg/dL (ref 0–200)
HDL: 28.3 mg/dL — ABNORMAL LOW (ref >39.00)
LDL Cholesterol: 52 mg/dL (ref 0–99)
NonHDL: 70.41
Total CHOL/HDL Ratio: 3
Triglycerides: 91 mg/dL (ref 0.0–149.0)
VLDL: 18.2 mg/dL (ref 0.0–40.0)

## 2022-02-07 LAB — BASIC METABOLIC PANEL
BUN: 29 mg/dL — ABNORMAL HIGH (ref 6–23)
CO2: 33 mEq/L — ABNORMAL HIGH (ref 19–32)
Calcium: 8.4 mg/dL (ref 8.4–10.5)
Chloride: 100 mEq/L (ref 96–112)
Creatinine, Ser: 1.01 mg/dL (ref 0.40–1.50)
GFR: 72.43 mL/min (ref >60.00)
Glucose, Bld: 102 mg/dL — ABNORMAL HIGH (ref 70–99)
Potassium: 3.6 mEq/L (ref 3.5–5.1)
Sodium: 140 mEq/L (ref 135–145)

## 2022-02-07 LAB — CBC WITH DIFFERENTIAL/PLATELET
Basophils Absolute: 0.1 10*3/uL (ref 0.0–0.1)
Basophils Relative: 0.8 % (ref 0.0–3.0)
Eosinophils Absolute: 0.1 10*3/uL (ref 0.0–0.7)
Eosinophils Relative: 1.5 % (ref 0.0–5.0)
HCT: 37.2 % — ABNORMAL LOW (ref 39.0–52.0)
Hemoglobin: 11.8 g/dL — ABNORMAL LOW (ref 13.0–17.0)
Lymphocytes Relative: 13.8 % (ref 12.0–46.0)
Lymphs Abs: 1.2 10*3/uL (ref 0.7–4.0)
MCHC: 31.8 g/dL (ref 30.0–36.0)
MCV: 87.6 fl (ref 78.0–100.0)
Monocytes Absolute: 0.8 10*3/uL (ref 0.1–1.0)
Monocytes Relative: 9.4 % (ref 3.0–12.0)
Neutro Abs: 6.4 10*3/uL (ref 1.4–7.7)
Neutrophils Relative %: 74.5 % (ref 43.0–77.0)
Platelets: 218 10*3/uL (ref 150.0–400.0)
RBC: 4.25 Mil/uL (ref 4.22–5.81)
RDW: 15.9 % — ABNORMAL HIGH (ref 11.5–15.5)
WBC: 8.6 10*3/uL (ref 4.0–10.5)

## 2022-02-07 LAB — VITAMIN B12: Vitamin B-12: 1445 pg/mL — ABNORMAL HIGH (ref 211–911)

## 2022-02-07 LAB — HEMOGLOBIN A1C: Hgb A1c MFr Bld: 6.6 % — ABNORMAL HIGH (ref 4.6–6.5)

## 2022-02-07 LAB — FOLATE: Folate: >23.8 ng/mL (ref >5.9)

## 2022-02-07 LAB — TSH: TSH: 3.03 u[IU]/mL (ref 0.35–5.50)

## 2022-02-07 MED ORDER — NUZYRA 150 MG PO TABS: 3.0000 | ORAL_TABLET | Freq: Every day | ORAL | 0 refills | Status: AC

## 2022-02-07 MED ORDER — NUZYRA 150 MG PO TABS: 2.0000 | ORAL_TABLET | Freq: Every day | ORAL | 0 refills | Status: DC

## 2022-02-07 NOTE — Progress Notes (Unsigned)
Subjective:  Patient ID: [name], male    DOB: 1945-09-24  Age: 76 y.o. MRN: [id number]  CC: No chief complaint on file.   HPI [name] presents for ***  Outpatient Medications Prior to Visit  Medication Sig Dispense Refill   atorvastatin (LIPITOR) 80 MG tablet TAKE 1 TABLET(80 MG) BY MOUTH DAILY 90 tablet 1   azithromycin (ZITHROMAX) 250 MG tablet Take 2 tablets on first day, then 1 tablet daily until finished. 6 tablet 0   carboxymethylcellulose (REFRESH PLUS) 0.5 % SOLN Place 1 drop into both eyes 3 (three) times daily as needed (dry eyes).     [facility] MELATONIN GUMMIES PO Take 5 mg by mouth at bedtime.     Docusate Sodium (DSS) 100 MG CAPS Take 100 mg by mouth every other day.     ELIQUIS 5 MG TABS tablet TAKE 1 TABLET(5 MG) BY MOUTH TWICE DAILY 60 tablet 0   empagliflozin (JARDIANCE) 10 MG TABS tablet Take 1 tablet (10 mg total) by mouth daily before breakfast. 90 tablet 1   finasteride (PROSCAR) 5 MG tablet Take 1 tablet (5 mg total) by mouth daily. 30 tablet 1   flecainide (TAMBOCOR) 50 MG tablet TAKE 1 TABLET(50 MG) BY MOUTH EVERY 12 HOURS 180 tablet 3   loratadine (CLARITIN) 10 MG tablet Take 10 mg by mouth daily.     LORazepam (ATIVAN) 0.5 MG tablet Take 1 tablet (0.5 mg total) by mouth at bedtime. 90 tablet 1   metoprolol succinate (TOPROL-XL) 25 MG 24 hr tablet Take 1 tablet (25 mg total) by mouth daily. 90 tablet 3   montelukast (SINGULAIR) 10 MG tablet TAKE 1 TABLET(10 MG) BY MOUTH AT BEDTIME 90 tablet 1   Multiple Vitamin (MULTIVITAMIN WITH MINERALS) TABS tablet Take 1 tablet by mouth daily.     pantoprazole (PROTONIX) 40 MG tablet TAKE 1 TABLET(40 MG) BY MOUTH DAILY 90 tablet 1   potassium chloride SA (KLOR-CON M) 20 MEQ tablet TAKE 1 TABLET BY MOUTH TWICE DAILY 180 tablet 3   rOPINIRole (REQUIP) 0.5 MG tablet TAKE 1 TABLET(0.5 MG) BY MOUTH THREE TIMES DAILY 270 tablet 2   sertraline (ZOLOFT) 100 MG tablet Take 1.5 tablets (150 mg total) by mouth at  bedtime. 135 tablet 1   torsemide (DEMADEX) 20 MG tablet TAKE 3 TABLETS BY MOUTH EVERY DAY 270 tablet 2   traMADol (ULTRAM) 50 MG tablet TAKE 1 TABLET BY MOUTH EVERY 8 HOURS AS NEEDED 90 tablet 5   zinc gluconate 50 MG tablet Take 1 tablet (50 mg total) by mouth daily. 90 tablet 1   No facility-administered medications prior to visit.    ROS Review of Systems  Objective:  BP 124/86 (BP Location: Left Arm, Patient Position: Sitting, Cuff Size: Large)   Pulse 61   Temp 99.1 F (37.3 C) (Oral)   Ht 6' (1.829 m)   SpO2 95%   BMI 50.99 kg/m   BP Readings from Last 3 Encounters:  02/07/22 124/86  01/27/22 124/72  08/01/21 130/70    Wt Readings from Last 3 Encounters:  08/01/21 (!) 376 lb (170.6 kg)  05/24/21 (!) 384 lb (174.2 kg)  05/12/21 (!) 384 lb (174.2 kg)    Physical Exam  Lab Results  Component Value Date   WBC 7.9 05/10/2021   HGB 12.2 (L) 05/12/2021   HCT 36.0 (L) 05/12/2021   PLT 221 05/10/2021   GLUCOSE 108 (H) 05/10/2021   CHOL 149 11/09/2020  TRIG 139.0 11/09/2020   HDL 44.20 11/09/2020   LDLDIRECT 91.0 05/16/2018   LDLCALC 77 11/09/2020   ALT 17 11/02/2020   AST 26 11/02/2020   NA 148 (H) 05/12/2021   K 3.7 05/12/2021   CL 99 05/10/2021   CREATININE 0.97 05/10/2021   BUN 28 (H) 05/10/2021   CO2 29 05/10/2021   TSH 2.43 11/09/2020   PSA 0.47 11/09/2020   INR 1.6 (H) 12/30/2020   HGBA1C 6.5 11/09/2020   MICROALBUR <0.7 11/09/2020    CARDIAC CATHETERIZATION  Result Date: 05/12/2021 Findings: Resting numbers on 2L O2 Ao = 127/60 (85) LV = 114/15 RA =  11 RV = 46/16 PA = 45/18 (32) PCW = 18 Fick cardiac output/index = 9.2/3.3 PVR = 1.6 WU ABG =  7.4/52/83/96% PA sat = 69%, 70% SVC sat = 69% Unloaded cycling PA = 74/39 (43) PCWP = 40 Thermo CO/CI = 12.5/4.5 Sat 95% 10W cycling PA = 84/37 (52) PCWP = 44 (v waves not prominent) Thermo CO/CI = 11.5/4.1 Sat 81% ABG at peak 7.30/67/55/84% Assessment: 1. Very mild pulmonary HTN at rest with high cardiac  output 2. 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 No evidence of L to T intracardiac shunting 4. 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 Recommend: BP control, trial of SGLT2i and weight loss. Glori Bickers, MD 12:01 PM   Assessment & Plan:   Diagnoses and all orders for this visit:  Type II diabetes mellitus with manifestations ([place]) -     Hemoglobin A1c; Future -     Basic metabolic panel; Future -     Basic metabolic panel -     Hemoglobin A1c  Hyperlipidemia with target LDL less than 130 -     Lipid panel; Future -     TSH; Future -     Hepatic function panel; Future -     Hepatic function panel -     TSH -     Lipid panel  B12 deficiency -     Folate; Future -     CBC with Differential/Platelet; Future -     Vitamin B12; Future -     Vitamin B12 -     CBC with Differential/Platelet -     Folate  Anemia due to zinc deficiency -     Zinc; Future -     CBC with Differential/Platelet; Future -     CBC with Differential/Platelet -     Zinc  Benign essential HTN -     TSH; Future -     Hepatic function panel; Future -     Basic metabolic panel; Future -     Basic metabolic panel -     Hepatic function panel -     TSH  PAF (paroxysmal atrial fibrillation) (HCC) -     TSH; Future -     Basic metabolic panel; Future -     Basic metabolic panel -     TSH   I am having [name] "[name]" maintain his DSS, loratadine, finasteride, multivitamin with minerals, [facility] MELATONIN GUMMIES PO, potassium chloride SA, carboxymethylcellulose, metoprolol succinate, empagliflozin, zinc gluconate, rOPINIRole, flecainide, atorvastatin, montelukast, pantoprazole, azithromycin, traMADol, Eliquis, torsemide, LORazepam, and  sertraline.  No orders of the defined types were placed in this encounter.  Follow-up: No follow-ups on file.  [name], MD

## 2022-02-07 NOTE — Patient Instructions (Addendum)
NUZYRA -  3 tabs today and tomorrow on an empty stomach, then 2 tabs each day for 14 moreCellulitis, Adult  Cellulitis is a skin infection. The infected area is usually warm, red, swollen, and tender. This condition occurs most often in the arms and lower legs. The infection can travel to the muscles, blood, and underlying tissue and become serious. It is very important to get treated for this condition. What are the causes? Cellulitis is caused by bacteria. The bacteria enter through a break in the skin, such as a cut, burn, insect bite, open sore, or crack. What increases the risk? This condition is more likely to occur in people who: Have a weak body defense system (immune system). Have open wounds on the skin, such as cuts, burns, bites, and scrapes. Bacteria can enter the body through these open wounds. Are older than 76 years of age. Have diabetes. Have a type of long-lasting (chronic) liver disease (cirrhosis) or kidney disease. Are obese. Have a skin condition such as: Itchy rash (eczema). Slow movement of blood in the veins (venous stasis). Fluid buildup below the skin (edema). Have had radiation therapy. Use IV drugs. What are the signs or symptoms? Symptoms of this condition include: Redness, streaking, or spotting on the skin. Swollen area of the skin. Tenderness or pain when an area of the skin is touched. Warm skin. A fever. Chills. Blisters. How is this diagnosed? This condition is diagnosed based on a medical history and physical exam. You may also have tests, including: Blood tests. Imaging tests. How is this treated? Treatment for this condition may include: Medicines, such as antibiotic medicines or medicines to treat allergies (antihistamines). Supportive care, such as rest and application of cold or warm cloths (compresses) to the skin. Hospital care, if the condition is severe. The infection usually starts to get better within 1-2 days of  treatment. Follow these instructions at home:  Medicines Take over-the-counter and prescription medicines only as told by your health care provider. If you were prescribed an antibiotic medicine, take it as told by your health care provider. Do not stop taking the antibiotic even if you start to feel better. General instructions Drink enough fluid to keep your urine pale yellow. Do not touch or rub the infected area. Raise (elevate) the infected area above the level of your heart while you are sitting or lying down. Apply warm or cold compresses to the affected area as told by your health care provider. Keep all follow-up visits as told by your health care provider. This is important. These visits let your health care provider make sure a more serious infection is not developing. Contact a health care provider if: You have a fever. Your symptoms do not begin to improve within 1-2 days of starting treatment. Your bone or joint underneath the infected area becomes painful after the skin has healed. Your infection returns in the same area or another area. You notice a swollen bump in the infected area. You develop new symptoms. You have a general ill feeling (malaise) with muscle aches and pains. Get help right away if: Your symptoms get worse. You feel very sleepy. You develop vomiting or diarrhea that persists. You notice red streaks coming from the infected area. Your red area gets larger or turns dark in color. These symptoms may represent a serious problem that is an emergency. Do not wait to see if the symptoms will go away. Get medical help right away. Call your local emergency services (911 in  the [place] Do not drive yourself to the hospital. Summary Cellulitis is a skin infection. This condition occurs most often in the arms and lower legs. Treatment for this condition may include medicines, such as antibiotic medicines or antihistamines. Take over-the-counter and prescription  medicines only as told by your health care provider. If you were prescribed an antibiotic medicine, do not stop taking the antibiotic even if you start to feel better. Contact a health care provider if your symptoms do not begin to improve within 1-2 days of starting treatment or your symptoms get worse. Keep all follow-up visits as told by your health care provider. This is important. These visits let your health care provider make sure that a more serious infection is not developing. This information is not intended to replace advice given to you by your health care provider. Make sure you discuss any questions you have with your health care provider. Document Revised: 12/29/2020 Document Reviewed: 12/30/2020 Elsevier Patient Education  [place].  days on an empty stomach

## 2022-02-08 ENCOUNTER — Telehealth: Payer: Self-pay | Admitting: Internal Medicine

## 2022-02-08 MED ORDER — NUZYRA 150 MG PO TABS: 2.0000 | ORAL_TABLET | Freq: Every day | ORAL | 0 refills | Status: AC

## 2022-02-09 LAB — ZINC: Zinc: 67 ug/dL (ref 60–130)

## 2022-02-10 ENCOUNTER — Ambulatory Visit: Payer: Medicare Other | Admitting: Nurse Practitioner

## 2022-02-10 ENCOUNTER — Encounter: Payer: Self-pay | Admitting: Internal Medicine

## 2022-02-10 LAB — WOUND CULTURE

## 2022-02-12 NOTE — Progress Notes (Unsigned)
Cardiology Office Note:    Date:  02/15/2022   ID:  [name], DOB 1945-11-09, MRN [id number]  PCP:  Janith Lima, MD   [place] Providers Cardiologist:  Pixie Casino, MD     Referring MD: Janith Lima, MD   Chief Complaint: follow-up HFpEF  History of Present Illness:    [name] is a pleasant 76 y.o. male with a hx of morbid obesity s/p bariatric surgery, HTN, OSA on CPAP, PAF on flecainide, HLD, recurrent PE, stroke, chronic HFpEF, pulmonary hypertension, mildly dilated ascending aorta at 40 mm, persistent dyspnea on exertion with exercise-induced desaturations, and RBBB.   Prior admissions in 2015 with multiple medical problems, including pulmonary embolus, moderate to large pericardial effusion, new onset A-fib, and GI bleed.  He underwent pericardial window on 07/18/2013 with hemorrhagic fluid drained. Gram stain and cultures negative.  Outpatient stress test 08/2013 showed normal myocardial perfusion and normal LVEF 58%. He has maintained consistent follow-up with persistent shortness of breath   Last cardiology clinic visit was 04/21/2021 with Dr. Debara Pickett. He continued to report DOE with significant desaturations with exertion on oxygen. Dr. Debara Pickett and Dr. Annamaria Boots, pulmonologist mutually agreed upon repeat echo with bubble study which revealed normal biventricular function, unable to assess diastolic function, negative bubble study for shunting. He underwent right and left heart cath on 05/12/21 which revealed very mild pulmonary hypertension at rest with high cardiac output.  Severe pulmonary hypertension with exertional desaturations at low level exertion due to marked elevation in PCWP.  No evidence of intracardiac shunting.  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 Study consistent with severe diastolic dysfunction with development of severe pulmonary venous HTN with low level exercise, recommendation for BP  control, SGLT2i and weight loss.  Today, he is here alone for follow-up. Travels in a motorized scooter. Recently started [place] for cellulitis. Very expensive medication. Feels like breathing is stable, "I'm PAH Stage 4." Is challenged with how to lose weight since he has severe PAH with exertion. On chronic O2. Feels that breathing is stable. Significant bilateral LE edema for which he is currently on antibiotics for non-healing foot wound. Legs are wrapped today. He denies chest pain, palpitations, melena, hematuria, hemoptysis, diaphoresis, presyncope, syncope, orthopnea, and PND. Very limited in activity tolerance.  He and his wife both have little stamina to cook, order most meals out.   Past Medical History:  Diagnosis Date   Anemia 07/29/2013   Asthma    childhood asthma   Benign essential HTN    Benign prostatic hypertrophy    several prostate biopsies neg for cancer.    Bilateral hip pain    Cancer (HCC)    CHF (congestive heart failure) (HCC)    Chronic anticoagulation 07/10/8117   Complication of anesthesia    MORPHINE CAUSES HALLUCINATIONS   CVA (cerebral vascular accident) ([place]) 06/2016   Depression    Diastolic congestive heart failure ([place]) 07/16/2013   Dysphagia, post-stroke    Embolic cerebral infarction ([place]) 06/21/2016   FH: colonic polyps 2010   Gout    History of kidney stones    Hypertension    Kidney cysts    Morbid obesity (HCC)    Numbness    feet / legs   PAF (paroxysmal atrial fibrillation) with RVR 07/29/2013   Pericarditis 2015   s/p window   Recurrent pulmonary emboli (HCC) 06/13/2015   Sleep apnea    USES C PAP  Stroke syndrome    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    Past Surgical History:  Procedure Laterality Date   ARTHROSCOPY KNEE W/ DRILLING     BREATH TEK H PYLORI N/A 12/23/2012   Procedure: BREATH TEK H PYLORI;  Surgeon: Gayland Curry, MD;  Location: [facility];  Service: General;  Laterality: N/A;   CHOLECYSTECTOMY  1989   COLONOSCOPY N/A  07/31/2013   Procedure: COLONOSCOPY;  Surgeon: Gatha Mayer, MD;  Location: [facility];  Service: Endoscopy;  Laterality: N/A;   ESOPHAGOGASTRODUODENOSCOPY N/A 07/22/2013   Procedure: ESOPHAGOGASTRODUODENOSCOPY (EGD);  Surgeon: Irene Shipper, MD;  Location: [facility] ENDOSCOPY;  Service: Endoscopy;  Laterality: N/A;   ESOPHAGOGASTRODUODENOSCOPY N/A 07/31/2013   Procedure: ESOPHAGOGASTRODUODENOSCOPY (EGD);  Surgeon: Gatha Mayer, MD;  Location: [facility] END[facility]COPY;  Service: Endoscopy;  Laterality: N/A;   INTRAOPERATIVE TRANSESOPHAGEAL ECHOCARDIOGRAM N/A 07/18/2013   Procedure: INTRAOPERATIVE TRANSESOPHAGEAL ECHOCARDIOGRAM;  Surgeon: Grace Isaac, MD;  Location: [place];  Service: Open Heart Surgery;  Laterality: N/A;   LAPAROSCOPIC GASTRIC BANDING WITH HIATAL HERNIA REPAIR N/A 04/08/2013   Procedure: LAPAROSCOPIC GASTRIC BANDING WITH possible  HIATAL HERNIA REPAIR;  Surgeon: Gayland Curry, MD;  Location: [facility];  Service: General;  Laterality: N/A;   LITHOTRIPSY     renal calculi     RIGHT AND LEFT HEART CATH N/A 05/12/2021   Procedure: RIGHT AND LEFT HEART CATH;  Surgeon: Jolaine Artist, MD;  Location: [facility];  Service: Cardiovascular;  Laterality: N/A;   SUBXYPHOID PERICARDIAL WINDOW N/A 07/18/2013   Procedure: SUBXYPHOID PERICARDIAL WINDOW;  Surgeon: Grace Isaac, MD;  Location: [facility] OR;  Service: Thoracic;  Laterality: N/A;   TONSILLECTOMY     total knee replacment     bilat   trigger finger repair     also lue, cts surgically repaired   WISDOM TOOTH EXTRACTION      Current Medications: Current Meds  Medication Sig   atorvastatin (LIPITOR) 80 MG tablet TAKE 1 TABLET(80 MG) BY MOUTH DAILY   carboxymethylcellulose (REFRESH PLUS) 0.5 % SOLN Place 1 drop into both eyes 3 (three) times daily as needed (dry eyes).   [employer] MELATONIN GUMMIES PO Take 5 mg by mouth at bedtime.   Docusate Sodium (DSS) 100 MG CAPS Take 100 mg by mouth every other day.   ELIQUIS 5 MG TABS tablet TAKE 1  TABLET(5 MG) BY MOUTH TWICE DAILY   empagliflozin (JARDIANCE) 10 MG TABS tablet Take 1 tablet (10 mg total) by mouth daily before breakfast.   finasteride (PROSCAR) 5 MG tablet Take 1 tablet (5 mg total) by mouth daily.   flecainide (TAMBOCOR) 50 MG tablet TAKE 1 TABLET(50 MG) BY MOUTH EVERY 12 HOURS   loratadine (CLARITIN) 10 MG tablet Take 10 mg by mouth daily.   LORazepam (ATIVAN) 0.5 MG tablet Take 1 tablet (0.5 mg total) by mouth at bedtime.   metoprolol succinate (TOPROL-XL) 25 MG 24 hr tablet Take 1 tablet (25 mg total) by mouth daily.   montelukast (SINGULAIR) 10 MG tablet TAKE 1 TABLET(10 MG) BY MOUTH AT BEDTIME   Multiple Vitamin (MULTIVITAMIN WITH MINERALS) TABS tablet Take 1 tablet by mouth daily.   Omadacycline Tosylate (NUZYRA) 150 MG TABS Take 2 tablets by mouth daily for 14 days.   pantoprazole (PROTONIX) 40 MG tablet TAKE 1 TABLET(40 MG) BY MOUTH DAILY   potassium chloride SA (KLOR-CON M) 20 MEQ tablet TAKE 1 TABLET BY MOUTH TWICE DAILY   rOPINIRole (REQUIP) 0.5 MG tablet TAKE 1 TABLET(0.5  MG) BY MOUTH THREE TIMES DAILY   sertraline (ZOLOFT) 100 MG tablet Take 1.5 tablets (150 mg total) by mouth at bedtime.   torsemide (DEMADEX) 20 MG tablet TAKE 3 TABLETS BY MOUTH EVERY DAY   traMADol (ULTRAM) 50 MG tablet TAKE 1 TABLET BY MOUTH EVERY 8 HOURS AS NEEDED   zinc gluconate 50 MG tablet Take 1 tablet (50 mg total) by mouth daily.     Allergies:   Morphine, Penicillins, Codeine, and Propoxyphene n-acetaminophen   Social History   Socioeconomic History   Marital status: Married    Spouse name: Not on file   Number of children: 1   Years of education: Not on file   Highest education level: Not on file  Occupational History   Occupation: retired-Manager [employer]  Tobacco Use   Smoking status: Former    Packs/day: 0.20    Years: 13.00    Total pack years: 2.60    Types: Cigarettes    Start date: 04/03/1961    Quit date: 08/30/1974    Years since quitting: 47.4    Smokeless tobacco: Never  Vaping Use   Vaping Use: Never used  Substance and Sexual Activity   Alcohol use: No    Alcohol/week: 0.0 standard drinks of alcohol   Drug use: No   Sexual activity: Not Currently  Other Topics Concern   Not on file  Social History Narrative   Lives at home w/ his wife   Right-handed   Caffeine: decaf coffee, occasional tea   Social Determinants of Health   Financial Resource Strain: Low Risk  (12/16/2020)   Overall Financial Resource Strain (CARDIA)    Difficulty of Paying Living Expenses: Not hard at all  Food Insecurity: No Food Insecurity (12/16/2020)   Hunger Vital Sign    Worried About Running Out of Food in the Last Year: Never true    [place] in the Last Year: Never true  Transportation Needs: No Transportation Needs (12/16/2020)   PRAPARE - Hydrologist (Medical): No    Lack of Transportation (Non-Medical): No  Physical Activity: Inactive (12/16/2020)   Exercise Vital Sign    Days of Exercise per Week: 0 days    Minutes of Exercise per Session: 0 min  Stress: No Stress Concern Present (12/16/2020)   [place]    Feeling of Stress : Not at all  Social Connections: [place] (12/16/2020)   Social Connection and Isolation Panel [NHANES]    Frequency of Communication with Friends and Family: More than three times a week    Frequency of Social Gatherings with Friends and Family: More than three times a week    Attends Religious Services: 1 to 4 times per year    Active Member of [employer] or Organizations: Yes    Attends Archivist Meetings: 1 to 4 times per year    Marital Status: Married     Family History: The patient's family history includes Dementia in his mother; Depression in his brother and mother; Diabetes in his cousin and paternal uncle; Heart disease in his father, paternal grandmother, and paternal uncle;  Hypertension in his mother; Stroke in his paternal aunt.  ROS:   Please see the history of present illness.     All other systems reviewed and are negative.  Labs/Other Studies Reviewed:    The following studies were reviewed today:  [facility] 05/12/21  Assessment: 1. Very mild pulmonary  HTN at rest with high cardiac output 2. 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   Recommend: BP control, trial of SGLT2i and weight loss.   Recent Labs: 03/22/2021: Pro B Natriuretic peptide (BNP) 71.0 02/07/2022: ALT 19; BUN 29; Creatinine, Ser 1.01; Hemoglobin 11.8; Platelets 218.0; Potassium 3.6; Sodium 140; TSH 3.03  Recent Lipid Panel    Component Value Date/Time   CHOL 99 02/07/2022 1512   TRIG 91.0 02/07/2022 1512   HDL 28.30 (L) 02/07/2022 1512   CHOLHDL 3 02/07/2022 1512   VLDL 18.2 02/07/2022 1512   LDLCALC 52 02/07/2022 1512   LDLCALC 74 10/27/2019 1400   LDLDIRECT 91.0 05/16/2018 1616     Risk Assessment/Calculations:    CHA2DS2-VASc Score = 8   This indicates a 10.8% annual risk of stroke. The patient's score is based upon: CHF History: 1 HTN History: 1 Diabetes History: 1 Stroke History: 2 Vascular Disease History: 1 Age Score: 2 Gender Score: 0    Physical Exam:    VS:  BP 136/78   Pulse (!) 58   Ht 6' (1.829 m)   Wt (!) 370 lb (167.8 kg)   SpO2 98%   BMI 50.18 kg/m     Wt Readings from Last 3 Encounters:  02/15/22 (!) 370 lb (167.8 kg)  08/01/21 (!) 376 lb (170.6 kg)  05/24/21 (!) 384 lb (174.2 kg)     GEN: Chronically ill appearing in no acute distress HEENT: Normal NECK: No JVD; No carotid bruits CARDIAC: RRR,  no murmurs, rubs, gallops RESPIRATORY:  Diminished breath sounds bilateral lower lobes without rales, wheezing or rhonchi  ABDOMEN: Soft, non-tender, obese  MUSCULOSKELETAL:  Legs are wrapped. Significant bilateral LE edema. No deformity. 2+ pedal pulses, equal bilaterally SKIN: Warm and dry NEUROLOGIC:  Alert and oriented x 3 PSYCHIATRIC:  Normal affect   EKG:  EKG is  ordered today.  The ekg ordered today demonstrates sinus bradycardia at 58 bpm, RBBB, QTc 498 ms, no acute change from previous  Diagnoses:    1. Chronic diastolic congestive heart failure ([place])   2. Morbid obesity ([place])   3. PAF (paroxysmal atrial fibrillation) ([place])   4. Chronic anticoagulation   5. High risk medication use   6. Pulmonary hypertension, unspecified ([place])   7. Essential hypertension    Assessment and Plan:     Chronic HFpEF: Normal LVEF 37-90%, normal diastolic parameters, mild LVH on echo 04/12/2021. Chronic DOE and chronic bilateral LE edema.  He feels that these are stable. Volume status is difficult to assess due to body habitus.  He is on GDMT including Jardiance, metoprolol, torsemide.   Pulmonary hypertension: He has significant dyspnea with exertion. No shunting on echo with bubble study. Right heart cath 05/2021 revealed significant exertional desaturations at low-level exertion due to marked elevation in PCWP. On chronic O2.  Brisk urine output on Torsemide. Feels that weight is stable.  Continue to follow with pulmonology as well as work on weight loss.   Hyperlipidemia: LDL 52 on 02/07/22.  Continue atorvastatin.  Obesity: Lengthy discussion about weight loss. He is frustrated with previous diets he has tried. Encouraged mostly plant-based eating and emphasizing flavor and fiber as well as  limiting processed foods. Materials provided for his review.   PAF: On Tikosyn. EKG reveals sinus bradycardia at 58 bpm.  QTc stable at 498 ms. No bleeding concerns.  Eliquis 5 mg twice daily is appropriate  for him no concerns with worsening palpitations, tachycardia.  Continue metoprolol, flecainide, Eliquis.  Hypertension: BP is well controlled today. Home BP monitors have not worked properly for him. No medication changes today.      Disposition: 6 months with Dr. Debara Pickett   Medication Adjustments/Labs and Tests Ordered: Current medicines are reviewed at length with the patient today.  Concerns regarding medicines are outlined above.  No orders of the defined types were placed in this encounter.  No orders of the defined types were placed in this encounter.   Patient Instructions  Medication Instructions:   Your physician recommends that you continue on your current medications as directed. Please refer to the Current Medication list given to you today.   *If you need a refill on your cardiac medications before your next appointment, please call your pharmacy*   Lab Work:  None ordered.  If you have labs (blood work) drawn today and your tests are completely normal, you will receive your results only by: [place] (if you have MyChart) OR A paper copy in the mail If you have any lab test that is abnormal or we need to change your treatment, we will call you to review the results.   Testing/Procedures:  None ordered.   Follow-Up: At [facility], you and your health needs are our priority.  As part of our continuing mission to provide you with exceptional heart care, we have created designated Provider Care Teams.  These Care Teams include your primary Cardiologist (physician) and Advanced Practice Providers (APPs -  Physician Assistants and Nurse Practitioners) who all work together to provide you with the care you need, when you need it.  We recommend signing up for the patient portal called "MyChart".  Sign up information is provided on this After Visit Summary.  MyChart is used to connect with patients for Virtual Visits (Telemedicine).  Patients are able to  view lab/test results, encounter notes, upcoming appointments, etc.  Non-urgent messages can be sent to your provider as well.   To learn more about what you can do with MyChart, go to [email or web address].    Your next appointment:   6 month(s)  The format for your next appointment:   In Person  Provider:   Pixie Casino, MD     Other Instructions Vegetarian Eating Information Many people may prefer vegetarian eating for religious, environmental, and other personal reasons. These diets are often lower in salt, sugar, cholesterol, and saturated and trans fats. Vegetarian eating provides significant health benefits. People who eat a vegetarian diet often have lower rates of: Obesity. Diabetes. Breast and colon cancers. Cardiovascular and gallbladder diseases. What are the types of vegetarian eating?  Vegetarian eating includes dietary choices that focus on eating mostly vegetables and fruit, grains, beans, nuts, and seeds. There are several different types of vegetarian eating. Talk with a dietitian about what type of vegetarian diet is best for you. Lacto-ovo vegetarian Recommended foods: fruits and vegetables, milk and dairy, eggs, grains, soy and vegetable protein, beans, nuts, and seeds. Foods to avoid: meat, poultry, seafood, animal-based broths and gravies, and gelatin. Lacto-vegetarian Recommended foods: fruits and vegetables, milk and dairy, grains, soy and vegetable protein, beans, nuts, and seeds. Foods to avoid: meat, poultry, seafood, animal-based broths and gravies,  gelatin, and eggs. Vegan Recommended foods: fruits and vegetables, grains, soy and vegetable protein, beans, nuts, and seeds. Foods to avoid: meat, poultry, seafood, animal-based broths and gravies, gelatin, eggs, milk and dairy, and honey. What do I need to know about vegetarian eating? All vegetarian diets restrict proteins that come from animals. Foods that come from animals have important nutrients,  such as protein, fats, vitamins, and minerals. It is important to get these nutrients from other types of foods. When shopping, focus on choosing whole foods to prepare at home instead of buying packaged meals that can be high in salt (sodium), sugar, and fat. If you think you may not be getting the right nutrients, or if you do not eat any animal products, talk with your health care provider or dietitian about taking supplements. A dietitian can help determine your individual nutrient needs. What are tips for following this plan? Eat a diet that includes a variety of fruits, vegetables, whole grains, and protein sources. This is important to make sure you get enough of the following nutrients: Protein Healthy protein sources include: Eggs, milk, and cheese. Soy products. Tofu, tempeh, and textured vegetable protein (TVP). Quinoa. Hemp seeds. Other protein sources include: Beans, such as black beans or kidney beans. Other legumes, such as lentils and split peas. Nuts, such as almonds, [place] nuts, and pecans. Seeds, such as sunflower seeds. To get the most benefit from plant-based proteins, combine two or more sources of plant protein with whole grains in one dish. Examples include beans and rice, almond butter on bread, or sunflower seeds on noodles. Vitamin B12 Sources of vitamin B12 include: Cheese and eggs. Breakfast cereals and other prepared products that have vitamin B12 added (fortified products). If you eat a vegan diet, ask your health care provider or dietitian about taking a B12 supplement. Vitamin D Good sources of vitamin D include: Egg yolks. Fortified dairy products. Fortified orange juice. Mushrooms. Cereals with added vitamin D. Another way of getting vitamin D is to spend 10 minutes each day in the sun. This helps your body make its own vitamin D. Depending on your age, you may need to take a vitamin D supplement. Talk with your health care provider or dietitian about how much  vitamin D you need. Iron Healthy sources of iron include: Dark, leafy greens. Nuts. Beans. Grain products that are fortified with iron, such as cereals. Tofu, tempeh, soybeans, and quinoa. To get the most iron from plant-based foods: Eat iron-containing plant-based foods with vitamin C. For example, squeeze fresh lemon juice over cooked greens like kale, chard, or spinach, or have a glass of orange juice with your meals. Avoid eating dairy products, coffee, or tea with iron-containing foods. Omega-3 fatty acids Good sources of omega-3 fatty acids include: Walnuts. Flaxseeds, canola oil, soybean oil, and tofu. Avocados. Olives and olive oil. Foods with added omega-3 fatty acids, such as eggs, milk, and juices. Calcium Good sources of calcium include: Dairy products. Fortified nondairy milk. Fortified tofu. Dark, leafy greens, such as kale, bok choy, Chinese cabbage, collard greens, and mustard greens. Broccoli. Okra. Fortified breakfast cereals and fruit juices. Figs. Zinc Good sources of zinc include: Pumpkin seeds. Legumes, such as chickpeas, kidney beans, and green peas. Wheat germ, whole grains, and fortified cereals. Mushrooms. Spinach and kale. Milk and dairy foods. Dark chocolate. Summary Many people may prefer vegetarian eating for religious, environmental, and other personal reasons. These diets can provide significant health benefits. There are several types of vegetarian diets, but all  restrict proteins that come from animals. It is important to make sure that you are getting enough nutrients, including protein, vitamin B12, vitamin D, iron, omega-3 fatty acids, calcium, and zinc, from your diet. If you think you are not getting the right nutrients or if you do not eat any animal products, talk with your health care provider or dietitian. This information is not intended to replace advice given to you by your health care provider. Make sure you discuss any questions you have with your  health care provider. Document Revised: 03/10/2020 Document Reviewed: 03/10/2020 Elsevier Patient Education  [place] refers to food and lifestyle choices that are based on the traditions of countries located on the [employer]. It focuses on eating more fruits, vegetables, whole grains, beans, nuts, seeds, and heart-healthy fats, and eating less dairy, meat, eggs, and processed foods with added sugar, salt, and fat. This way of eating has been shown to help prevent certain conditions and improve outcomes for people who have chronic diseases, like kidney disease and heart disease. What are tips for following this plan? Reading food labels Check the serving size of packaged foods. For foods such as rice and pasta, the serving size refers to the amount of cooked product, not dry. Check the total fat in packaged foods. Avoid foods that have saturated fat or trans fats. Check the ingredient list for added sugars, such as corn syrup. Shopping  Buy a variety of foods that offer a balanced diet, including: Fresh fruits and vegetables (produce). Grains, beans, nuts, and seeds. Some of these may be available in unpackaged forms or large amounts (in bulk). Fresh seafood. Poultry and eggs. Low-fat dairy products. Buy whole ingredients instead of prepackaged foods. Buy fresh fruits and vegetables in-season from local farmers markets. Buy plain frozen fruits and vegetables. If you do not have access to quality fresh seafood, buy precooked frozen shrimp or canned fish, such as tuna, salmon, or sardines. Stock your pantry so you always have certain foods on hand, such as olive oil, canned tuna, canned tomatoes, rice, pasta, and beans. Cooking Cook foods with extra-virgin olive oil instead of using butter or other vegetable oils. Have meat as a side dish, and have vegetables or grains as your main dish. This means having meat in small portions or  adding small amounts of meat to foods like pasta or stew. Use beans or vegetables instead of meat in common dishes like chili or lasagna. Experiment with different cooking methods. Try roasting, broiling, steaming, and sauting vegetables. Add frozen vegetables to soups, stews, pasta, or rice. Add nuts or seeds for added healthy fats and plant protein at each meal. You can add these to yogurt, salads, or vegetable dishes. Marinate fish or vegetables using olive oil, lemon juice, garlic, and fresh herbs. Meal planning Plan to eat one vegetarian meal one day each week. Try to work up to two vegetarian meals, if possible. Eat seafood two or more times a week. Have healthy snacks readily available, such as: Vegetable sticks with hummus. Greek yogurt. Fruit and nut trail mix. Eat balanced meals throughout the week. This includes: Fruit: 2-3 servings a day. Vegetables: 4-5 servings a day. Low-fat dairy: 2 servings a day. Fish, poultry, or lean meat: 1 serving a day. Beans and legumes: 2 or more servings a week. Nuts and seeds: 1-2 servings a day. Whole grains: 6-8 servings a day. Extra-virgin olive oil: 3-4 servings a day. Limit red meat and sweets  to only a few servings a month. Lifestyle  Cook and eat meals together with your family, when possible. Drink enough fluid to keep your urine pale yellow. Be physically active every day. This includes: Aerobic exercise like running or swimming. Leisure activities like gardening, walking, or housework. Get 7-8 hours of sleep each night. If recommended by your health care provider, drink red wine in moderation. This means 1 glass a day for nonpregnant women and 2 glasses a day for men. A glass of wine equals 5 oz (150 mL). What foods should I eat? Fruits Apples. Apricots. Avocado. Berries. Bananas. Cherries. Dates. Figs. Grapes. Lemons. Melon. Oranges. Peaches. Plums. Pomegranate. Vegetables Artichokes. Beets. Broccoli. Cabbage. Carrots.  Eggplant. Green beans. Chard. Kale. Spinach. Onions. Leeks. Peas. Squash. Tomatoes. Peppers. Radishes. Grains Whole-grain pasta. Brown rice. Bulgur wheat. Polenta. Couscous. Whole-wheat bread. [place]. Meats and other proteins Beans. Almonds. Sunflower seeds. Pine nuts. Peanuts. [place]. Salmon. Scallops. Shrimp. Fonda. Tilapia. Clams. Oysters. Eggs. Poultry without skin. Dairy Low-fat milk. Cheese. Greek yogurt. Fats and oils Extra-virgin olive oil. Avocado oil. Grapeseed oil. Beverages Water. Red wine. Herbal tea. Sweets and desserts Greek yogurt with honey. Baked apples. Poached pears. Trail mix. Seasonings and condiments Basil. Cilantro. Coriander. Cumin. Mint. Parsley. Sage. Rosemary. Tarragon. Garlic. Oregano. Thyme. Pepper. Balsamic vinegar. Tahini. Hummus. Tomato sauce. Olives. Mushrooms. The items listed above may not be a complete list of foods and beverages you can eat. Contact a dietitian for more information. What foods should I limit? This is a list of foods that should be eaten rarely or only on special occasions. Fruits Fruit canned in syrup. Vegetables Deep-fried potatoes (french fries). Grains Prepackaged pasta or rice dishes. Prepackaged cereal with added sugar. Prepackaged snacks with added sugar. Meats and other proteins Beef. Pork. Lamb. Poultry with skin. Hot dogs. Berniece Salines. Dairy Ice cream. Sour cream. Whole milk. Fats and oils Butter. Canola oil. Vegetable oil. Beef fat (tallow). Lard. Beverages Juice. Sugar-sweetened soft drinks. Beer. Liquor and spirits. Sweets and desserts Cookies. Cakes. Pies. Candy. Seasonings and condiments Mayonnaise. Pre-made sauces and marinades. The items listed above may not be a complete list of foods and beverages you should limit. Contact a dietitian for more information. Summary The Mediterranean diet includes both food and lifestyle choices. Eat a variety of fresh fruits and vegetables, beans, nuts, seeds, and whole  grains. Limit the amount of red meat and sweets that you eat. If recommended by your health care provider, drink red wine in moderation. This means 1 glass a day for nonpregnant women and 2 glasses a day for men. A glass of wine equals 5 oz (150 mL). This information is not intended to replace advice given to you by your health care provider. Make sure you discuss any questions you have with your health care provider. Document Revised: 04/25/2019 Document Reviewed: 02/20/2019 Elsevier Patient Education  [place] Eating Plan DASH stands for Dietary Approaches to Stop Hypertension. The DASH eating plan is a healthy eating plan that has been shown to: Reduce high blood pressure (hypertension). Reduce your risk for type 2 diabetes, heart disease, and stroke. Help with weight loss. What are tips for following this plan? Reading food labels Check food labels for the amount of salt (sodium) per serving. Choose foods with less than 5 percent of the Daily Value of sodium. Generally, foods with less than 300 milligrams (mg) of sodium per serving fit into this eating plan. To find whole grains, look for the word "whole" as the first  word in the ingredient list. Shopping Buy products labeled as "low-sodium" or "no salt added." Buy fresh foods. Avoid canned foods and pre-made or frozen meals. Cooking Avoid adding salt when cooking. Use salt-free seasonings or herbs instead of table salt or sea salt. Check with your health care provider or pharmacist before using salt substitutes. Do not fry foods. Cook foods using healthy methods such as baking, boiling, grilling, roasting, and broiling instead. Cook with heart-healthy oils, such as olive, canola, avocado, soybean, or sunflower oil. Meal planning  Eat a balanced diet that includes: 4 or more servings of fruits and 4 or more servings of vegetables each day. Try to fill one-half of your plate with fruits and vegetables. 6-8 servings of  whole grains each day. Less than 6 oz (170 g) of lean meat, poultry, or fish each day. A 3-oz (85-g) serving of meat is about the same size as a deck of cards. One egg equals 1 oz (28 g). 2-3 servings of low-fat dairy each day. One serving is 1 cup (237 mL). 1 serving of nuts, seeds, or beans 5 times each week. 2-3 servings of heart-healthy fats. Healthy fats called omega-3 fatty acids are found in foods such as walnuts, flaxseeds, fortified milks, and eggs. These fats are also found in cold-water fish, such as sardines, salmon, and mackerel. Limit how much you eat of: Canned or prepackaged foods. Food that is high in trans fat, such as some fried foods. Food that is high in saturated fat, such as fatty meat. Desserts and other sweets, sugary drinks, and other foods with added sugar. Full-fat dairy products. Do not salt foods before eating. Do not eat more than 4 egg yolks a week. Try to eat at least 2 vegetarian meals a week. Eat more home-cooked food and less restaurant, buffet, and fast food. Lifestyle When eating at a restaurant, ask that your food be prepared with less salt or no salt, if possible. If you drink alcohol: Limit how much you use to: 0-1 drink a day for women who are not pregnant. 0-2 drinks a day for men. Be aware of how much alcohol is in your drink. In the [place] one drink equals one 12 oz bottle of beer (355 mL), one 5 oz glass of wine (148 mL), or one 1 oz glass of hard liquor (44 mL). General information Avoid eating more than 2,300 mg of salt a day. If you have hypertension, you may need to reduce your sodium intake to 1,500 mg a day. Work with your health care provider to maintain a healthy body weight or to lose weight. Ask what an ideal weight is for you. Get at least 30 minutes of exercise that causes your heart to beat faster (aerobic exercise) most days of the week. Activities may include walking, swimming, or biking. Work with your health care provider or  dietitian to adjust your eating plan to your individual calorie needs. What foods should I eat? Fruits All fresh, dried, or frozen fruit. Canned fruit in natural juice (without added sugar). Vegetables Fresh or frozen vegetables (raw, steamed, roasted, or grilled). Low-sodium or reduced-sodium tomato and vegetable juice. Low-sodium or reduced-sodium tomato sauce and tomato paste. Low-sodium or reduced-sodium canned vegetables. Grains Whole-grain or whole-wheat bread. Whole-grain or whole-wheat pasta. Brown rice. [place]. Bulgur. Whole-grain and low-sodium cereals. Pita bread. Low-fat, low-sodium crackers. Whole-wheat flour tortillas. Meats and other proteins Skinless chicken or [place]. Ground chicken or [place]. Pork with fat trimmed off. Fish and seafood. Egg whites.  Dried beans, peas, or lentils. Unsalted nuts, nut butters, and seeds. Unsalted canned beans. Lean cuts of beef with fat trimmed off. Low-sodium, lean precooked or cured meat, such as sausages or meat loaves. Dairy Low-fat (1%) or fat-free (skim) milk. Reduced-fat, low-fat, or fat-free cheeses. Nonfat, low-sodium ricotta or cottage cheese. Low-fat or nonfat yogurt. Low-fat, low-sodium cheese. Fats and oils Soft margarine without trans fats. Vegetable oil. Reduced-fat, low-fat, or light mayonnaise and salad dressings (reduced-sodium). Canola, safflower, olive, avocado, soybean, and sunflower oils. Avocado. Seasonings and condiments Herbs. Spices. Seasoning mixes without salt. Other foods Unsalted popcorn and pretzels. Fat-free sweets. The items listed above may not be a complete list of foods and beverages you can eat. Contact a dietitian for more information. What foods should I avoid? Fruits Canned fruit in a light or heavy syrup. Fried fruit. Fruit in cream or butter sauce. Vegetables Creamed or fried vegetables. Vegetables in a cheese sauce. Regular canned vegetables (not low-sodium or reduced-sodium). Regular canned  tomato sauce and paste (not low-sodium or reduced-sodium). Regular tomato and vegetable juice (not low-sodium or reduced-sodium). Angie Fava. Olives. Grains Baked goods made with fat, such as croissants, muffins, or some breads. Dry pasta or rice meal packs. Meats and other proteins Fatty cuts of meat. Ribs. Fried meat. Berniece Salines. Bologna, salami, and other precooked or cured meats, such as sausages or meat loaves. Fat from the back of a pig (fatback). Bratwurst. Salted nuts and seeds. Canned beans with added salt. Canned or smoked fish. Whole eggs or egg yolks. Chicken or [place] with skin. Dairy Whole or 2% milk, cream, and half-and-half. Whole or full-fat cream cheese. Whole-fat or sweetened yogurt. Full-fat cheese. Nondairy creamers. Whipped toppings. Processed cheese and cheese spreads. Fats and oils Butter. Stick margarine. Lard. Shortening. Ghee. Bacon fat. Tropical oils, such as coconut, palm kernel, or palm oil. Seasonings and condiments Onion salt, garlic salt, seasoned salt, table salt, and sea salt. Worcestershire sauce. Tartar sauce. Barbecue sauce. Teriyaki sauce. Soy sauce, including reduced-sodium. Steak sauce. Canned and packaged gravies. Fish sauce. Oyster sauce. Cocktail sauce. Store-bought horseradish. Ketchup. Mustard. Meat flavorings and tenderizers. Bouillon cubes. Hot sauces. Pre-made or packaged marinades. Pre-made or packaged taco seasonings. Relishes. Regular salad dressings. Other foods Salted popcorn and pretzels. The items listed above may not be a complete list of foods and beverages you should avoid. Contact a dietitian for more information. Where to find more information National Heart, Lung, and Blood Institute: https://wilson-eaton.com/ American Heart Association: www.heart.org Academy of Nutrition and Dietetics: www.eatright.[place]: www.kidney.org Summary The DASH eating plan is a healthy eating plan that has been shown to reduce high blood pressure  (hypertension). It may also reduce your risk for type 2 diabetes, heart disease, and stroke. When on the DASH eating plan, aim to eat more fresh fruits and vegetables, whole grains, lean proteins, low-fat dairy, and heart-healthy fats. With the DASH eating plan, you should limit salt (sodium) intake to 2,300 mg a day. If you have hypertension, you may need to reduce your sodium intake to 1,500 mg a day. Work with your health care provider or dietitian to adjust your eating plan to your individual calorie needs. This information is not intended to replace advice given to you by your health care provider. Make sure you discuss any questions you have with your health care provider. Document Revised: 02/21/2019 Document Reviewed: 02/21/2019 Elsevier Patient Education  [place]         Signed, Designer, fashion/clothing, Weed  M, NP  02/15/2022 5:12 PM    [place]

## 2022-02-13 ENCOUNTER — Encounter (HOSPITAL_BASED_OUTPATIENT_CLINIC_OR_DEPARTMENT_OTHER): Payer: Medicare Other | Attending: General Surgery | Admitting: General Surgery

## 2022-02-13 DIAGNOSIS — Z6841 Body Mass Index (BMI) 40.0 and over, adult: Secondary | ICD-10-CM | POA: Insufficient documentation

## 2022-02-13 DIAGNOSIS — L97812 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d: Secondary | ICD-10-CM | POA: Insufficient documentation

## 2022-02-13 DIAGNOSIS — E11622 Type 2 diabetes mellitus with other skin ulcer: Secondary | ICD-10-CM | POA: Diagnosis not present

## 2022-02-13 DIAGNOSIS — L97822 Non-pressure chronic ulcer of other part of left lower leg with fat layer exposed: Secondary | ICD-10-CM | POA: Insufficient documentation

## 2022-02-13 DIAGNOSIS — L03115 Cellulitis of right lower limb: Secondary | ICD-10-CM | POA: Diagnosis not present

## 2022-02-13 DIAGNOSIS — I2721 Secondary pulmonary arterial hypertension: Secondary | ICD-10-CM | POA: Insufficient documentation

## 2022-02-13 DIAGNOSIS — I5032 Chronic diastolic (congestive) heart failure: Secondary | ICD-10-CM | POA: Diagnosis not present

## 2022-02-13 DIAGNOSIS — J3801 Paralysis of vocal cords and larynx, unilateral: Secondary | ICD-10-CM | POA: Insufficient documentation

## 2022-02-13 DIAGNOSIS — I69398 Other sequelae of cerebral infarction: Secondary | ICD-10-CM | POA: Diagnosis not present

## 2022-02-13 DIAGNOSIS — I110 Hypertensive heart disease with heart failure: Secondary | ICD-10-CM | POA: Insufficient documentation

## 2022-02-13 DIAGNOSIS — L97512 Non-pressure chronic ulcer of other part of right foot with fat layer exposed: Secondary | ICD-10-CM | POA: Diagnosis not present

## 2022-02-13 DIAGNOSIS — I890 Lymphedema, not elsewhere classified: Secondary | ICD-10-CM | POA: Insufficient documentation

## 2022-02-13 DIAGNOSIS — E11621 Type 2 diabetes mellitus with foot ulcer: Secondary | ICD-10-CM | POA: Insufficient documentation

## 2022-02-13 DIAGNOSIS — J9611 Chronic respiratory failure with hypoxia: Secondary | ICD-10-CM | POA: Diagnosis not present

## 2022-02-14 NOTE — Progress Notes (Signed)
Karczewski, Ramses A ([id number]) [id number] Page 1 of 4 Visit Report for 02/13/2022 Abuse Risk Screen Details Patient Name: Date of Service: [name], [name] 02/13/2022 1:30 PM Medical Record Number: [id number] Patient Account Number: [id number] Date of Birth/Sex: Treating RN: 24-Mar-1946 (76 y.o. [name] Primary Care [name]: [name] Other Clinician: Referring [name]: Treating [name]/Extender: [name] in Treatment: 0 Abuse Risk Screen Items Answer ABUSE RISK SCREEN: Has anyone close to you tried to hurt or harm you recentlyo No Do you feel uncomfortable with anyone in your familyo No Has anyone forced you do things that you didnt want to doo No Electronic Signature(s) Signed: 02/14/2022 7:40:26 AM By: [name] Entered By: [name] on 02/13/2022 14:02:36 -------------------------------------------------------------------------------- Activities of Daily Living Details Patient Name: Date of Service: [name] 02/13/2022 1:30 PM Medical Record Number: [id number] Patient Account Number: [id number] Date of Birth/Sex: Treating RN: 27-Jan-1946 (76 y.o. [name] Primary Care [name]: [name] Other Clinician: Referring [name]: Treating [name]/Extender: [name] in Treatment: 0 Activities of Daily Living Items Answer Activities of Daily Living (Please select one for each item) Drive Automobile Completely Able T Medications ake Completely Able Use T elephone Completely Able Care for Appearance Completely Able Use T oilet Completely Able Bath / Shower Completely Able Dress Self Completely Able Feed Self Completely Able Walk Need Assistance Get In / Out Bed Completely Able Housework Not Able Prepare Meals Need Assistance Handle Money Need Assistance Shop for Self Need Assistance Electronic  Signature(s) Signed: 02/14/2022 7:40:26 AM By: [name] Entered By: [name] on 02/13/2022 14:03:19 [name], [name] A ([id number]) [id number] Page 2 of 4 -------------------------------------------------------------------------------- Education Screening Details Patient Name: Date of Service: [name], [name] 02/13/2022 1:30 PM Medical Record Number: [id number] Patient Account Number: [id number] Date of Birth/Sex: Treating RN: 07-24-1945 (76 y.o. [name] Primary Care [name]: [name] Other Clinician: Referring Barnie Sopko: Treating [name]/Extender: [name] in Treatment: 0 Primary Learner Assessed: Patient Learning Preferences/Education Level/Primary Language Learning Preference: Explanation, Demonstration, Video, Printed Material Highest Education Level: College or Above Preferred Language: English Cognitive Barrier Language Barrier: No Translator Needed: No Memory Deficit: No Emotional Barrier: No Cultural/Religious Beliefs Affecting Medical Care: No Physical Barrier Impaired Vision: Yes Glasses Impaired Hearing: No Decreased Hand dexterity: No Knowledge/Comprehension Knowledge Level: Medium Comprehension Level: Medium Ability to understand written instructions: Medium Ability to understand verbal instructions: Medium Motivation Anxiety Level: Calm Cooperation: Cooperative Education Importance: Acknowledges Need Interest in Health Problems: Asks Questions Perception: Coherent Willingness to Engage in Self-Management Medium Activities: Readiness to Engage in Self-Management Medium Activities: Electronic Signature(s) Signed: 02/14/2022 7:40:26 AM By: [name] Entered By: [name] on 02/13/2022 14:04:15 -------------------------------------------------------------------------------- Fall Risk Assessment Details Patient Name: Date of  Service: [name], [name] Hurley. 02/13/2022 1:30 PM Medical Record Number: [id number] Patient Account Number: [id number] Date of Birth/Sex: Treating RN: [birth year] (76 y.o. [name] Primary Care [name]: [name] Other Clinician: Referring [name]: Treating [name]/Extender: [name] in Treatment: 0 Fall Risk Assessment Items Have you had 2 or more falls in the last 12 monthso 0 Yes [name], [name] A ([id number]) [id number] Page 3 of 4 Have you had any fall that resulted in injury in the last 12 monthso 0 Yes FALLS RISK SCREEN History of falling - immediate or within 3 months 25 Yes Secondary diagnosis (Do you have 2 or more medical diagnoseso) 15 Yes Ambulatory aid  None/bed rest/wheelchair/nurse 0 No Crutches/cane/walker 15 Yes Furniture 0 No Intravenous therapy Access/Saline/Heparin Lock 0 No Gait/Transferring Normal/ bed rest/ wheelchair 0 No Weak (short steps with or without shuffle, stooped but able to lift head while walking, may seek 10 Yes support from furniture) Impaired (short steps with shuffle, may have difficulty arising from chair, head down, impaired 20 Yes balance) Mental Status Oriented to own ability 0 Yes Electronic Signature(s) Signed: 02/14/2022 7:40:26 AM By: [name] Entered By: [name] on 02/13/2022 14:05:05 -------------------------------------------------------------------------------- Foot Assessment Details Patient Name: Date of Service: [name], [name] 02/13/2022 1:30 PM Medical Record Number: [id number] Patient Account Number: [id number] Date of Birth/Sex: Treating RN: [birth year] (76 y.o. [name] Primary Care [name]: [name] Other Clinician: Referring [name]: Treating [name]/Extender: [name] in Treatment: 0 Foot Assessment Items Site Locations + = Sensation present, - = Sensation  absent, C = Callus, U = Ulcer R = Redness, W = Warmth, M = Maceration, PU = Pre-ulcerative lesion F = Fissure, S = Swelling, D = Dryness Assessment Right: Left: Other Deformity: No No Prior Foot Ulcer: No No Prior Amputation: No No Charcot Joint: No No Ambulatory Status: Ambulatory With Help Assistance DeviceLonzo [name], [name] ([id number]) [id number] Page 4 of 4 Gait: Administrator, arts) Signed: 02/14/2022 7:40:26 AM By: [name] Entered By: [name] on 02/13/2022 14:10:04 -------------------------------------------------------------------------------- Nutrition Risk Screening Details Patient Name: Date of Service: [name], [name] 02/13/2022 1:30 PM Medical Record Number: [id number] Patient Account Number: [id number] Date of Birth/Sex: Treating RN: 10-27-1945 (76 y.o. [name] Primary Care Carlton Sweaney: [name] Other Clinician: Referring Akasha Melena: Treating Shyniece Scripter/Extender: [name] in Treatment: 0 Height (in): 72 Weight (lbs): 375 Body Mass Index (BMI): 50.9 Nutrition Risk Screening Items Score Screening NUTRITION RISK SCREEN: I have an illness or condition that made me change the kind and/or amount of food I eat 0 No I eat fewer than two meals per day 3 Yes I eat few fruits and vegetables, or milk products 0 No I have three or more drinks of beer, liquor or wine almost every day 0 No I have tooth or mouth problems that make it hard for me to eat 0 No I don't always have enough money to buy the food I need 0 No I eat alone most of the time 0 No I take three or more different prescribed or over-the-counter drugs a day 1 Yes Without wanting to, I have lost or gained 10 pounds in the last six months 0 No I am not always physically able to shop, cook and/or feed myself 0 No Nutrition Protocols Good Risk Protocol Moderate Risk Protocol 0 Provide education  on nutrition High Risk Proctocol Risk Level: Moderate Risk Score: 4 Electronic Signature(s) Signed: 02/14/2022 7:40:26 AM By: [name] Entered By: [name] on 02/13/2022 14:06:43

## 2022-02-14 NOTE — Progress Notes (Signed)
[name] ([id number]) [id number] Page 1 of 13 Visit Report for 02/13/2022 Allergy List Details Patient Name: Date of Service: [name], [name]. 02/13/2022 1:30 PM Medical Record Number: [id number] Patient Account Number: [id number] Date of Birth/Sex: [name]: 1946/03/08 (76 y.o. [name] Primary Care [name]: [name] Other Clinician: Referring [name]: [name] [name]/Extender: [name] in Treatment: 0 Allergies Active Allergies morphine Reaction: [name] penicillin codeine Reaction: nausea propoxyphene Reaction: nausea Darvocet-[name] Allergy Notes Electronic Signature(s) Signed: 02/14/2022 7:40:26 AM By: Adline Peals Entered By: Adline Peals on 02/13/2022 13:39:14 -------------------------------------------------------------------------------- Arrival Information Details Patient Name: Date of Service: [name], [name]. 02/13/2022 1:30 PM Medical Record Number: [id number] Patient Account Number: [id number] Date of Birth/Sex: [name]: Jan 19, 1946 (76 y.o. [name]: [name] Other Clinician: Referring [name]: [name] [name]/Extender: [name] in Treatment: 0 Visit Information Patient Arrived: Other Arrival Time: 13:28 Accompanied By: self Transfer Assistance: None Patient Identification Verified: Yes Secondary Verification Process Completed: Yes Patient Requires Transmission-Based Precautions: No Patient Has Alerts: Yes Patient Alerts: Patient on Blood Thinner Electronic Signature(s) Signed: 02/14/2022 7:40:26 AM By: Adline Peals Entered By: Adline Peals on 02/13/2022 13:38:18 [name], [name] ([id number]) [id number] Page 2 of 13 -------------------------------------------------------------------------------- Encounter Discharge Information  Details Patient Name: Date of Service: [name] 02/13/2022 1:30 PM Medical Record Number: [id number] Patient Account Number: [id number] Date of Birth/Sex: [name]: [birth year] (76 y.o. [name] Primary Care Raia Amico: [name] Other Clinician: Referring Jaeline Whobrey: [name] Kiani Wurtzel/Extender: [name] in Treatment: 0 Encounter Discharge Information Items Post Procedure Vitals Discharge Condition: Stable Temperature (F): 98.5 Ambulatory Status: Cane Pulse (bpm): 60 Discharge Destination: Home Respiratory Rate (breaths/min): 22 Transportation: Private Auto Blood Pressure (mmHg): 142/71 Accompanied By: self Schedule Follow-up Appointment: Yes Clinical Summary of Care: Patient Declined Electronic Signature(s) Signed: 02/14/2022 7:40:26 AM By: Adline Peals Entered By: Adline Peals on 02/13/2022 16:32:54 -------------------------------------------------------------------------------- Lower Extremity Assessment Details Patient Name: Date of Service: [name], [name]. 02/13/2022 1:30 PM Medical Record Number: [id number] Patient Account Number: [id number] Date of Birth/Sex: [name]: April 01, 1946 (76 y.o. [name] Primary Care Robynn Marcel: [name] Other Clinician: Referring Adith Tejada: [name] Darshana Curnutt/Extender: [name] in Treatment: 0 Edema Assessment Assessed: [name]: No] [name]: No] [Left: Edema] [Right: :] Calf Left: Right: Point of Measurement: From Medial Instep 55.8 cm 60 cm Ankle Left: Right: Point of Measurement: From Medial Instep 35.1 cm 35.9 cm Knee To Floor Left: Right: From Medial Instep 42 cm 42 cm Vascular Assessment Pulses: Dorsalis Pedis Palpable: [Left:No] [Right:No] Notes bilateral ABIs noncompressible [name] ([id number]) [id number] Page 3 of 13 Electronic Signature(s) Signed: 02/14/2022 7:40:26 AM  By: [name] By: Adline Peals on 02/13/2022 16:27:26 -------------------------------------------------------------------------------- Multi Wound Chart Details Patient Name: Date of Service: [name], [name]. 02/13/2022 1:30 PM Medical Record Number: [id number] Patient Account Number: [id number] Date of Birth/Sex: [name]: 08/14/1945 (76 y.o. M) Primary Care Zaliyah Meikle: [name] Other Clinician: Referring [name]: [name] [name]/Extender: [name] in Treatment: 0 Vital Signs Height(in): 46 Pulse(bpm): 23 Weight(lbs): 375 Blood Pressure(mmHg): 142/71 Body Mass Index(BMI): 50.9 Temperature(F): 98.5 Respiratory Rate(breaths/min): 22 [1:Photos:] Right, Dorsal Foot Right, Anterior Lower Leg Right, Lateral Lower Leg Wound Location: Blister Blister Blister Wounding Event: Lymphedema Lymphedema Lymphedema Primary Etiology: Anemia, Lymphedema, Sleep Apnea, Anemia, Lymphedema, Sleep Apnea, Anemia, Lymphedema, Sleep Apnea, Comorbid History: Arrhythmia, Congestive Heart Failure, Arrhythmia, Congestive Heart Failure, Arrhythmia,  Congestive Heart Failure, Hypertension, Type II Diabetes, Hypertension, Type II Diabetes, Hypertension, Type II Diabetes, Neuropathy Neuropathy Neuropathy 01/31/2022 02/13/2021 02/13/2021 Date Acquired: 0 0 0 Weeks of Treatment: Open Open Open Wound Status: No No No Wound Recurrence: 3.5x4.1x0.1 2.5x1.7x0.1 1.4x1x0.1 Measurements L x W x D (cm) 11.27 3.338 1.1 [name] (cm) : rea 1.127 0.334 0.11 Volume (cm) : Full Thickness Without Exposed Full Thickness Without Exposed Full Thickness Without Exposed Classification: Support Structures Support Structures Support Structures Medium Medium Medium Exudate [name] mount: Serosanguineous Serosanguineous Serosanguineous Exudate Type: red, brown red, brown red, brown Exudate Color: Distinct, outline attached Distinct, outline attached Distinct,  outline attached Wound Margin: Small (1-33%) Large (67-100%) Small (1-33%) Granulation [name] mount: Red Red, Pink Red Granulation Quality: Large (67-100%) Small (1-33%) Large (67-100%) Necrotic [name] mount: Eschar, Adherent Slough Adherent [facility], Adherent Slough Necrotic Tissue: Fat Layer (Subcutaneous Tissue): Yes Fat Layer (Subcutaneous Tissue): Yes Fat Layer (Subcutaneous Tissue): Yes Exposed Structures: Fascia: No Fascia: No Fascia: No Tendon: No Tendon: No Tendon: No Muscle: No Muscle: No Muscle: No Joint: No Joint: No Joint: No Bone: No Bone: No Bone: No None None None Epithelialization: Debridement - Excisional Debridement - Excisional Debridement - Selective/Open Wound Debridement: Pre-procedure Verification/Time Out 14:52 14:52 14:52 Taken: Lidocaine 5% topical ointment Lidocaine 5% topical ointment Lidocaine 5% topical ointment Pain Control: Necrotic/Eschar, Subcutaneous, Subcutaneous, Slough Necrotic/Eschar, Slough Tissue Debrided: Slough Skin/Subcutaneous Tissue Skin/Subcutaneous Tissue Non-Viable Tissue Level: 14.35 4.25 6 Debridement [name] (sq cm): rea Curette Curette Curette Instrument: Mcclure, Jahzier [name] ([id number]) [id number] Page 4 of 13 Minimum Minimum Minimum Bleeding: Pressure Pressure Pressure Hemostasis [name] chieved: Procedure was tolerated well Procedure was tolerated well Procedure was tolerated well Debridement Treatment Response: 3.5x4.1x0.1 2.5x1.7x0.1 1.4x1x0.1 Post Debridement Measurements L x W x D (cm) 1.127 0.334 0.11 Post Debridement Volume: (cm) No Abnormalities Noted No Abnormalities Noted No Abnormalities Noted Periwound Skin Texture: Dry/Scaly: Yes Dry/Scaly: Yes Dry/Scaly: Yes Periwound Skin Moisture: Erythema: Yes Hemosiderin Staining: Yes Hemosiderin Staining: Yes Periwound Skin Color: No Abnormality No Abnormality No Abnormality Temperature: Debridement Debridement  Debridement Procedures Performed: Wound Number: 4 [name]/[name] [name]/[name] Photos: [name]/[name] [name]/[name] Left, Anterior Lower Leg [name]/[name] [name]/[name] Wound Location: Blister [name]/[name] [name]/[name] Wounding Event: Lymphedema [name]/[name] [name]/[name] Primary Etiology: Anemia, Lymphedema, Sleep Apnea, [name]/[name] [name]/[name] Comorbid History: Arrhythmia, Congestive Heart Failure, Hypertension, Type II Diabetes, Neuropathy 02/13/2021 [name]/[name] [name]/[name] Date Acquired: 0 [name]/[name] [name]/[name] Weeks of Treatment: Open [name]/[name] [name]/[name] Wound Status: No [name]/[name] [name]/[name] Wound Recurrence: 7x1.5x0.1 [name]/[name] [name]/[name] Measurements L x W x D (cm) 8.247 [name]/[name] [name]/[name] [name] (cm) : rea 0.825 [name]/[name] [name]/[name] Volume (cm) : Full Thickness Without Exposed [name]/[name] [name]/[name] Classification: Support Structures Medium [name]/[name] [name]/[name] Exudate [name] mount: Serosanguineous [name]/[name] [name]/[name] Exudate Type: red, brown [name]/[name] [name]/[name] Exudate Color: Distinct, outline attached [name]/[name] [name]/[name] Wound Margin: Large (67-100%) [name]/[name] [name]/[name] Granulation [name] mount: Red [name]/[name] [name]/[name] Granulation Quality: Small (1-33%) [name]/[name] [name]/[name] Necrotic [name] mount: Adherent Slough [name]/[name] [name]/[name] Necrotic Tissue: Fat Layer (Subcutaneous Tissue): Yes [name]/[name] [name]/[name] Exposed Structures: Fascia: No Tendon: No Muscle: No Joint: No Bone: No None [name]/[name] [name]/[name] Epithelialization: Debridement - Excisional [name]/[name] [name]/[name] Debridement: Pre-procedure Verification/Time Out 14:52 [name]/[name] [name]/[name] Taken: Lidocaine 5% topical ointment [name]/[name] [name]/[name] Pain Control: Subcutaneous, Slough [name]/[name] [name]/[name] Tissue Debrided: Skin/Subcutaneous Tissue [name]/[name] [name]/[name] Level: 6 [name]/[name] [name]/[name] Debridement [name] (sq cm): rea Curette [name]/[name] [name]/[name] Instrument: Minimum [name]/[name] [name]/[name] Bleeding: Pressure [name]/[name] [name]/[name] Hemostasis [name] chieved: Procedure was tolerated well [name]/[name] [name]/[name] Debridement Treatment Response: 7x1.5x0.1 [name]/[name] [name]/[name] Post Debridement Measurements L x W x D (cm) 0.825 [name]/[name]  [name]/[name] Post Debridement Volume: (cm) No Abnormalities Noted [name]/[name] [name]/[name] Periwound Skin Texture: Dry/Scaly: Yes [name]/[name] [name]/[name] Periwound Skin Moisture: Erythema: Yes [name]/[name] [name]/[name] Periwound Skin Color: Hemosiderin Staining: Yes No Abnormality [name]/[name]  [name]/[name] Temperature: Debridement [name]/[name] [name]/[name] Procedures Performed: Treatment Notes Electronic Signature(s) Signed: 02/13/2022 3:17:24 PM By: [name] MD FACS Entered By: [name] on 02/13/2022 15:17:24 Posch, Mustafa [name] ([id number]) [id number] Page 5 of 13 -------------------------------------------------------------------------------- Multi-Disciplinary Care Plan Details Patient Name: Date of Service: [name], [name]. 02/13/2022 1:30 PM Medical Record Number: [id number] Patient Account Number: [id number] Date of Birth/Sex: [name]: 07-31-1945 (76 y.o. [name] Primary Care Bern Fare: [name] Other Clinician: Referring Forrest Demuro: [name] Shequita Peplinski/Extender: [name] in Treatment: 0 Active Inactive Necrotic Tissue Nursing Diagnoses: Impaired tissue integrity related to necrotic/devitalized tissue Knowledge deficit related to management of necrotic/devitalized tissue Goals: Necrotic/devitalized tissue will be minimized in the wound bed Date Initiated: 02/13/2022 Target Resolution Date: 04/07/2022 Goal Status: Active Patient/caregiver will verbalize understanding of reason and process for debridement of necrotic tissue Date Initiated: 02/13/2022 Target Resolution Date: 04/07/2022 Goal Status: Active Interventions: Assess patient pain level pre-, during and post procedure and prior to discharge Provide education on necrotic tissue and debridement process Treatment Activities: Apply topical anesthetic as ordered : 02/13/2022 Notes: Wound/Skin Impairment Nursing Diagnoses: Impaired tissue integrity Knowledge deficit related to ulceration/compromised skin integrity Goals: Patient/caregiver will verbalize understanding of skin care regimen Date Initiated: 02/13/2022 Target Resolution Date: 04/07/2022 Goal Status: Active Interventions: Assess ulceration(s) every visit Treatment  Activities: Skin care regimen initiated : 02/13/2022 Topical wound management initiated : 02/13/2022 Notes: Electronic Signature(s) Signed: 02/14/2022 7:40:26 AM By: Adline Peals Entered By: Adline Peals on 02/13/2022 14:45:47 Pain Assessment Details -------------------------------------------------------------------------------- [name] Masson [name] ([id number]) [id number] Page 6 of 13 Patient Name: Date of Service: [name] Rock. 02/13/2022 1:30 PM Medical Record Number: [id number] Patient Account Number: [id number] Date of Birth/Sex: [name]: 1945-06-06 (76 y.o. [name] Primary Care Bessie Boyte: [name] Other Clinician: Referring Janai Maudlin: [name] Jersie Beel/Extender: [name] in Treatment: 0 Active Problems Location of Pain Severity and Description of Pain Patient Has Paino No Site Locations Rate the pain. Current Pain Level: 0 Pain Management and Medication Current Pain Management: Electronic Signature(s) Signed: 02/14/2022 7:40:26 AM By: Adline Peals Entered By: Adline Peals on 02/13/2022 14:39:27 -------------------------------------------------------------------------------- Patient/Caregiver Education Details Patient Name: Date of Service: [name], [name]. 11/13/2023andnbsp1:30 PM Medical Record Number: [id number] Patient Account Number: [id number] Date of Birth/Gender: [name]: [birth year] (76 y.o. [name] Primary Care Physician: [name] Other Clinician: Referring Physician: Treating Physician/Extender: [name] in Treatment: 0 Education Assessment Education Provided To: Patient Education Topics Provided Wound/Skin Impairment: Methods: Explain/Verbal Responses: Reinforcements needed, State content correctly Electronic Signature(s) Signed: 02/14/2022 7:40:26 AM By: Adline Peals Entered By:  Adline Peals on 02/13/2022 14:44:52 Lacerda, Tamotsu [name] ([id number]) [id number] Page 7 of 13 -------------------------------------------------------------------------------- Wound Assessment Details Patient Name: Date of Service: [name] 02/13/2022 1:30 PM Medical Record Number: [id number] Patient Account Number: [id number] Date of Birth/Sex: [name]: 1945-04-27 (76 y.o. [name] Primary Care Nehemiah Montee: [name] Other Clinician: Referring Ikran Patman: [name] Chiquita Heckert/Extender: [name] in Treatment: 0 Wound Status Wound Number: 1 Primary Lymphedema Etiology: Wound Location: Right, Dorsal Foot Wound Open Wounding Event: Blister Status: Date Acquired: 01/31/2022 Comorbid Anemia, Lymphedema, Sleep Apnea, Arrhythmia, Congestive Heart Weeks Of Treatment: 0 History: Failure, Hypertension, Type II Diabetes, Neuropathy Clustered Wound: No  Photos Wound Measurements Length: (cm) 3.5 Width: (cm) 4.1 Depth: (cm) 0.1 Area: (cm) 11.27 Volume: (cm) 1.127 % Reduction in Area: % Reduction in Volume: Epithelialization: None Tunneling: No Undermining: No Wound Description Classification: Full Thickness Without Exposed Suppor Wound Margin: Distinct, outline attached Exudate Amount: Medium Exudate Type: Serosanguineous Exudate Color: red, brown t Structures Foul Odor After Cleansing: No Slough/Fibrino Yes Wound Bed Granulation Amount: Small (1-33%) Exposed Structure Granulation Quality: Red Fascia Exposed: No Necrotic Amount: Large (67-100%) Fat Layer (Subcutaneous Tissue) Exposed: Yes Necrotic Quality: Eschar, Adherent Slough Tendon Exposed: No Muscle Exposed: No Joint Exposed: No Bone Exposed: No Periwound Skin Texture Texture Color No Abnormalities Noted: Yes No Abnormalities Noted: No Erythema: Yes Moisture No Abnormalities Noted: No Temperature / Pain Dry / Scaly:  Yes Temperature: No Abnormality Electronic Signature(s) Signed: 02/14/2022 7:40:26 AM By: Adline Peals Entered By: Adline Peals on 02/13/2022 14:30:39 [name], [name] ([id number]) [id number] Page 8 of 13 -------------------------------------------------------------------------------- Wound Assessment Details Patient Name: Date of Service: [name] 02/13/2022 1:30 PM Medical Record Number: [id number] Patient Account Number: [id number] Date of Birth/Sex: [name]: 1945-11-08 (76 y.o. [name] Primary Care Suprina Mandeville: [name] Other Clinician: Referring Aleeza [place]: [name] Jeyden Coffelt/Extender: [name] in Treatment: 0 Wound Status Wound Number: 2 Primary Lymphedema Etiology: Wound Location: Right, Anterior Lower Leg Wound Open Wounding Event: Blister Status: Date Acquired: 02/13/2021 Comorbid Anemia, Lymphedema, Sleep Apnea, Arrhythmia, Congestive Heart Weeks Of Treatment: 0 History: Failure, Hypertension, Type II Diabetes, Neuropathy Clustered Wound: No Photos Wound Measurements Length: (cm) 2.5 Width: (cm) 1.7 Depth: (cm) 0.1 Area: (cm) 3.338 Volume: (cm) 0.334 % Reduction in Area: % Reduction in Volume: Epithelialization: None Tunneling: No Undermining: No Wound Description Classification: Full Thickness Without Exposed Suppor Wound Margin: Distinct, outline attached Exudate Amount: Medium Exudate Type: Serosanguineous Exudate Color: red, brown t Structures Foul Odor After Cleansing: No Slough/Fibrino Yes Wound Bed Granulation Amount: Large (67-100%) Exposed Structure Granulation Quality: Red, Pink Fascia Exposed: No Necrotic Amount: Small (1-33%) Fat Layer (Subcutaneous Tissue) Exposed: Yes Necrotic Quality: Adherent Slough Tendon Exposed: No Muscle Exposed: No Joint Exposed: No Bone Exposed: No Periwound Skin Texture Texture Color No Abnormalities Noted:  Yes No Abnormalities Noted: No Hemosiderin Staining: Yes Moisture No Abnormalities Noted: No Temperature / Pain Dry / Scaly: Yes Temperature: No Abnormality Treatment Notes Wound #2 (Lower Leg) Wound Laterality: Right, Anterior Cleanser Soap and Water Quinonez, Bradan [name] ([id number]) [id number] Page 9 of 13 Discharge Instruction: May shower and wash wound with dial antibacterial soap and water prior to dressing change. Wound Cleanser Discharge Instruction: Cleanse the wound with wound cleanser prior to applying [name] clean dressing using gauze sponges, not tissue or cotton balls. Peri-Wound Care Sween Lotion (Moisturizing lotion) Discharge Instruction: Apply moisturizing lotion as directed Topical Primary Dressing Hydrofera Blue Ready Foam, 2.5 x2.5 in Discharge Instruction: Apply to wound bed as instructed Secondary Dressing ABD Pad, 5x9 Discharge Instruction: Apply over primary dressing as directed. Woven Gauze Sponge, Non-Sterile 4x4 in Discharge Instruction: Apply over primary dressing as directed. Secured With Transpore Surgical Tape, 2x10 (in/yd) Discharge Instruction: Secure dressing with tape as directed. Compression Wrap Compression Stockings Jobst Farrow Wrap 4000 Quantity: 1 Right Leg Compression Amount: 30-40 mmHg Discharge Instruction: Apply Francia Greaves daily as instructed. Apply first thing in the morning, remove at night before bed. Add-Ons Electronic Signature(s) Signed: 02/14/2022 7:40:26 AM By: Adline Peals Entered By: Adline Peals on 02/13/2022 14:31:23 -------------------------------------------------------------------------------- Wound Assessment Details Patient Name: Date of Service: [name], [name]. 02/13/2022 1:30 PM Medical Record Number: [id number] Patient Account Number: [id number] Date of Birth/Sex: [name]: 1945/06/01 (76 y.o. [name] Primary Care Kionna Brier: [name] Other  Clinician: Referring Reyana Leisey: [name] Maryum Batterson/Extender: [name] in Treatment: 0 Wound Status Wound Number: 3 Primary Lymphedema Etiology: Wound Location: Right, Lateral Lower Leg Wound Open Wounding Event: Blister Status: Date Acquired: 02/13/2021 Comorbid Anemia, Lymphedema, Sleep Apnea, Arrhythmia, Congestive Heart Weeks Of Treatment: 0 History: Failure, Hypertension, Type II Diabetes, Neuropathy Clustered Wound: No Photos [name], [name] ([id number]) [id number] Page 10 of 13 Wound Measurements Length: (cm) Width: (cm) Depth: (cm) Area: (cm) Volume: (cm) 1.4 % Reduction in Area: 1 % Reduction in Volume: 0.1 Epithelialization: None 1.1 Tunneling: No 0.11 Undermining: No Wound Description Classification: Full Thickness Without Exposed Suppor Wound Margin: Distinct, outline attached Exudate Amount: Medium Exudate Type: Serosanguineous Exudate Color: red, brown t Structures Foul Odor After Cleansing: No Slough/Fibrino Yes Wound Bed Granulation Amount: Small (1-33%) Exposed Structure Granulation Quality: Red Fascia Exposed: No Necrotic Amount: Large (67-100%) Fat Layer (Subcutaneous Tissue) Exposed: Yes Necrotic Quality: Eschar, Adherent Slough Tendon Exposed: No Muscle Exposed: No Joint Exposed: No Bone Exposed: No Periwound Skin Texture Texture Color No Abnormalities Noted: Yes No Abnormalities Noted: No Hemosiderin Staining: Yes Moisture No Abnormalities Noted: No Temperature / Pain Dry / Scaly: Yes Temperature: No Abnormality Treatment Notes Wound #3 (Lower Leg) Wound Laterality: Right, Lateral Cleanser Peri-Wound Care Sween Lotion (Moisturizing lotion) Discharge Instruction: Apply moisturizing lotion as directed Topical Primary Dressing Hydrofera Blue Ready Foam, 2.5 x2.5 in Discharge Instruction: Apply to wound bed as instructed Secondary Dressing ABD Pad, 5x9 Discharge Instruction:  Apply over primary dressing as directed. Woven Gauze Sponge, Non-Sterile 4x4 in Discharge Instruction: Apply over primary dressing as directed. Secured With Transpore Surgical Tape, 2x10 (in/yd) Discharge Instruction: Secure dressing with tape as directed. Compression Wrap ThreePress (3 layer compression wrap) Discharge Instruction: Apply three layer compression as directed. Kunesh, Taym [name] ([id number]) [id number] Page 11 of 13 Compression Stockings Add-Ons Electronic Signature(s) Signed: 02/14/2022 7:40:26 AM By: Adline Peals Entered By: Adline Peals on 02/13/2022 14:31:56 -------------------------------------------------------------------------------- Wound Assessment Details Patient Name: Date of Service: [name], Rayshard [name]. 02/13/2022 1:30 PM Medical Record Number: [id number] Patient Account Number: [id number] Date of Birth/Sex: [name]: Jan 07, 1946 (76 y.o. [name] Primary Care Lenox Bink: [name] Other Clinician: Referring Nelia Rogoff: [name] Deen Deguia/Extender: [name] in Treatment: 0 Wound Status Wound Number: 4 Primary Lymphedema Etiology: Wound Location: Left, Anterior Lower Leg Wound Open Wounding Event: Blister Status: Date Acquired: 02/13/2021 Comorbid Anemia, Lymphedema, Sleep Apnea, Arrhythmia, Congestive Heart Weeks Of Treatment: 0 History: Failure, Hypertension, Type II Diabetes, Neuropathy Clustered Wound: No Photos Wound Measurements Length: (cm) 7 Width: (cm) 1.5 Depth: (cm) 0.1 Area: (cm) 8.247 Volume: (cm) 0.825 % Reduction in Area: % Reduction in Volume: Epithelialization: None Tunneling: No Undermining: No Wound Description Classification: Full Thickness Without Exposed Suppor Wound Margin: Distinct, outline attached Exudate Amount: Medium Exudate Type: Serosanguineous Exudate Color: red, brown t Structures Foul Odor After Cleansing:  No Slough/Fibrino Yes Wound Bed Granulation Amount: Large (67-100%) Exposed Structure Granulation Quality: Red Fascia Exposed: No Necrotic Amount: Small (1-33%) Fat Layer (Subcutaneous Tissue) Exposed: Yes Necrotic Quality: Adherent Slough Tendon Exposed: No Muscle Exposed: No Joint Exposed: No Bone Exposed: No Periwound Skin Texture Texture Color No Abnormalities Noted: Yes No Abnormalities Noted: No Erythema: Yes Moisture H[place], Spurgeon [name] ([id number]) [id number] Page 12 of 13 Erythema: Yes Moisture Hemosiderin Staining: Yes No Abnormalities Noted:  No Dry / Scaly: Yes Temperature / Pain Temperature: No Abnormality Treatment Notes Wound #4 (Lower Leg) Wound Laterality: Left, Anterior Cleanser Soap and Water Discharge Instruction: May shower and wash wound with dial antibacterial soap and water prior to dressing change. Wound Cleanser Discharge Instruction: Cleanse the wound with wound cleanser prior to applying [name] clean dressing using gauze sponges, not tissue or cotton balls. Peri-Wound Care Sween Lotion (Moisturizing lotion) Discharge Instruction: Apply moisturizing lotion as directed Topical Primary Dressing Hydrofera Blue Ready Foam, 2.5 x2.5 in Discharge Instruction: Apply to wound bed as instructed Secondary Dressing ABD Pad, 5x9 Discharge Instruction: Apply over primary dressing as directed. Woven Gauze Sponge, Non-Sterile 4x4 in Discharge Instruction: Apply over primary dressing as directed. Secured With Transpore Surgical Tape, 2x10 (in/yd) Discharge Instruction: Secure dressing with tape as directed. Compression Wrap ThreePress (3 layer compression wrap) Discharge Instruction: Apply three layer compression as directed. Compression Stockings Jobst Farrow Wrap 4000 Quantity: 1 Left Leg Compression Amount: 30-40 mmHg Discharge Instruction: Apply Francia Greaves daily as instructed. Apply first thing in the morning, remove at night  before bed. Add-Ons Electronic Signature(s) Signed: 02/14/2022 7:40:26 AM By: [name] By: Adline Peals on 02/13/2022 14:32:36 -------------------------------------------------------------------------------- Vitals Details Patient Name: Date of Service: [name], Taber [name]. 02/13/2022 1:30 PM Medical Record Number: [id number] Patient Account Number: [id number] Date of Birth/Sex: [name]: 19-May-1945 (76 y.o. [name] Primary Care Maddelynn Moosman: [name] Other Clinician: Referring Ranyah Groeneveld: [name] Rance Smithson/Extender: [name] in Treatment: 0 Vital Signs Time Taken: 13:38 Temperature (F): 98.5 Height (in): 72 Pulse (bpm): 60 Source: Stated Respiratory Rate (breaths/min): 22 Weight (lbs): 375 Blood Pressure (mmHg): 142/71 Source: Stated Reference Range: 80 - 120 mg / dl Zabawa, Khole [name] ([id number]) [id number] Page 13 of 13 Body Mass Index (BMI): 50.9 Electronic Signature(s) Signed: 02/14/2022 7:40:26 AM By: Adline Peals Entered By: Adline Peals on 02/13/2022 13:38:45

## 2022-02-14 NOTE — Progress Notes (Signed)
[name] [name] ([id number]) [id number] Page 1 of 14 Visit Report for 02/13/2022 Chief Complaint Document Details Patient Name: Date of Service: [name] [name], [place] [name]. 02/13/2022 1:30 PM Medical Record Number: [id number] Patient Account Number: [id number] Date of Birth/Sex: Treating RN: 1945/11/06 (76 y.o. M) Primary Care Provider: [name] Other Clinician: Referring Provider: Treating Provider/Extender: [name] in Treatment: 0 Information Obtained from: Patient Chief Complaint Patients presents for treatment of open ulcers secondary to lymphedema and diabetes Electronic Signature(s) Signed: 02/13/2022 3:21:47 PM By: [name] MD FACS Previous Signature: 02/13/2022 1:37:51 PM Version By: [name] MD FACS Entered By: [name] on 02/13/2022 15:21:47 -------------------------------------------------------------------------------- Debridement Details Patient Name: Date of Service: [name], [name] [name]. 02/13/2022 1:30 PM Medical Record Number: [id number] Patient Account Number: [id number] Date of Birth/Sex: Treating RN: February 19, 1946 (76 y.o. [name] Primary Care Provider: [name] Other Clinician: Referring Provider: Treating Provider/Extender: [name] in Treatment: 0 Debridement Performed for Assessment: Wound #4 Left,Anterior Lower Leg Performed By: Physician [name], MD Debridement Type: Debridement Level of Consciousness (Pre-procedure): Awake and Alert Pre-procedure Verification/Time Out Yes - 14:52 Taken: Start Time: 14:52 Pain Control: Lidocaine 5% topical ointment T Area Debrided (L x W): otal 4 (cm) x 1.5 (cm) = 6 (cm) Tissue and other material debrided: Non-Viable, Slough, Subcutaneous, Slough Level: Skin/Subcutaneous Tissue Debridement Description: Excisional Instrument: Curette Bleeding: Minimum Hemostasis Achieved:  Pressure Response to Treatment: Procedure was tolerated well Level of Consciousness (Post- Awake and Alert procedure): Post Debridement Measurements of Total Wound Length: (cm) 7 Width: (cm) 1.5 Depth: (cm) 0.1 Volume: (cm) 0.825 Character of Wound/Ulcer Post Debridement: Improved Post Procedure Diagnosis Same as Pre-procedure [name] [name], [name] [name] ([id number]) [id number] Page 2 of 14 Notes scribed for Dr. Celine Ahr by Adline Peals, RN Electronic Signature(s) Signed: 02/14/2022 7:39:34 AM By: [name] MD FACS Signed: 02/14/2022 7:40:26 AM By: Sabas Sous By: Adline Peals on 02/13/2022 14:43:00 -------------------------------------------------------------------------------- Debridement Details Patient Name: Date of Service: [name], [name] [name]. 02/13/2022 1:30 PM Medical Record Number: [id number] Patient Account Number: [id number] Date of Birth/Sex: Treating RN: [birth year] (76 y.o. [name] Primary Care Provider: [name] Other Clinician: Referring Provider: Treating Provider/Extender: [name] in Treatment: 0 Debridement Performed for Assessment: Wound #3 Right,Lateral Lower Leg Performed By: Physician [name], MD Debridement Type: Debridement Level of Consciousness (Pre-procedure): Awake and Alert Pre-procedure Verification/Time Out Yes - 14:52 Taken: Start Time: 14:52 Pain Control: Lidocaine 5% topical ointment T Area Debrided (L x W): otal 3 (cm) x 2 (cm) = 6 (cm) Tissue and other material debrided: Non-Viable, Eschar, Slough, Slough Level: Non-Viable Tissue Debridement Description: Selective/Open Wound Instrument: Curette Bleeding: Minimum Hemostasis Achieved: Pressure Response to Treatment: Procedure was tolerated well Level of Consciousness (Post- Awake and Alert procedure): Post Debridement Measurements of Total Wound Length: (cm) 1.4 Width: (cm)  1 Depth: (cm) 0.1 Volume: (cm) 0.11 Character of Wound/Ulcer Post Debridement: Improved Post Procedure Diagnosis Same as Pre-procedure Notes scribed for Dr. Celine Ahr by Adline Peals, RN Electronic Signature(s) Signed: 02/14/2022 7:39:34 AM By: [name] MD FACS Signed: 02/14/2022 7:40:26 AM By: Adline Peals Entered By: Adline Peals on 02/13/2022 14:47:36 -------------------------------------------------------------------------------- Debridement Details Patient Name: Date of Service: [name], [name] [name]. 02/13/2022 1:30 PM Medical Record Number: [id number] Patient Account Number: [id number] Date of Birth/Sex: Treating RN: [birth year] (76 y.o. [name] Primary Care Provider: [name] Other Clinician: Vicente Masson [name] ([id number]) [id number] Page 3 of 14 Referring Provider:  Treating Provider/Extender: [name] in Treatment: 0 Debridement Performed for Assessment: Wound #2 Right,Anterior Lower Leg Performed By: Physician [name], MD Debridement Type: Debridement Level of Consciousness (Pre-procedure): Awake and Alert Pre-procedure Verification/Time Out Yes - 14:52 Taken: Start Time: 14:52 Pain Control: Lidocaine 5% topical ointment T Area Debrided (L x W): otal 2.5 (cm) x 1.7 (cm) = 4.25 (cm) Tissue and other material debrided: Non-Viable, Slough, Subcutaneous, Slough Level: Skin/Subcutaneous Tissue Debridement Description: Excisional Instrument: Curette Bleeding: Minimum Hemostasis Achieved: Pressure Response to Treatment: Procedure was tolerated well Level of Consciousness (Post- Awake and Alert procedure): Post Debridement Measurements of Total Wound Length: (cm) 2.5 Width: (cm) 1.7 Depth: (cm) 0.1 Volume: (cm) 0.334 Character of Wound/Ulcer Post Debridement: Improved Post Procedure Diagnosis Same as Pre-procedure Notes scribed for Dr. Celine Ahr by Adline Peals, RN Electronic Signature(s) Signed: 02/14/2022 7:39:34 AM By: [name] MD FACS Signed: 02/14/2022 7:40:26 AM By: Adline Peals Entered By: Adline Peals on 02/13/2022 14:50:34 -------------------------------------------------------------------------------- Debridement Details Patient Name: Date of Service: [name], [name] [name]. 02/13/2022 1:30 PM Medical Record Number: [id number] Patient Account Number: [id number] Date of Birth/Sex: Treating RN: 1945-09-21 (76 y.o. [name] Primary Care Provider: [name] Other Clinician: Referring Provider: Treating Provider/Extender: [name] in Treatment: 0 Debridement Performed for Assessment: Wound #1 Right,Dorsal Foot Performed By: Physician [name], MD Debridement Type: Debridement Severity of Tissue Pre Debridement: Fat layer exposed Level of Consciousness (Pre-procedure): Awake and Alert Pre-procedure Verification/Time Out Yes - 14:52 Taken: Start Time: 14:52 Pain Control: Lidocaine 5% topical ointment T Area Debrided (L x W): otal 3.5 (cm) x 4.1 (cm) = 14.35 (cm) Tissue and other material debrided: Non-Viable, Eschar, Slough, Subcutaneous, Slough Level: Skin/Subcutaneous Tissue Debridement Description: Excisional Instrument: Curette Bleeding: Minimum Hemostasis Achieved: Pressure Response to Treatment: Procedure was tolerated well Level of Consciousness (Post- Awake and Alert procedure): Post Debridement Measurements of Total Wound [name] [name], [name] [name] ([id number]) [id number] Page 4 of 14 Length: (cm) 3.5 Width: (cm) 4.1 Depth: (cm) 0.1 Volume: (cm) 1.127 Character of Wound/Ulcer Post Debridement: Improved Severity of Tissue Post Debridement: Fat layer exposed Post Procedure Diagnosis Same as Pre-procedure Notes scribed for Dr. Celine Ahr by Adline Peals, RN Electronic Signature(s) Signed: 02/14/2022 7:39:34 AM By:  [name] MD FACS Signed: 02/14/2022 7:40:26 AM By: Adline Peals Entered By: Adline Peals on 02/13/2022 14:51:23 -------------------------------------------------------------------------------- HPI Details Patient Name: Date of Service: [name], [name] [name]. 02/13/2022 1:30 PM Medical Record Number: [id number] Patient Account Number: [id number] Date of Birth/Sex: Treating RN: 1945-07-02 (76 y.o. M) Primary Care Provider: [name] Other Clinician: Referring Provider: Treating Provider/Extender: [name] in Treatment: 0 History of Present Illness HPI Description: ADMISSION 02/13/2022 This is [name] 76 year old morbidly obese type II diabetic (last hemoglobin A1c 6.6%) who presented to his primary care provider's office on November 7 with an ulcer on the top of his foot. He said it had been there for about [name] week. He is insensate in his feet and his wife had been caring for it. His PCP took [name] swab from the wound which grew back strep. [name] was prescribed and he was referred to the wound care center for further evaluation and management. In addition to diabetes, he also has pulmonary artery hypertension and diastolic congestive heart failure. 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 He has been seen at the voice and swallowing disorders clinic in [place] and no further  intervention has been felt necessary. On exam, he has severe, at least stage II, lymphedema. In addition to the dorsal foot wound, he has ulcers on both lower legs. He has [name] left anterior lower leg, [name] right lateral lower leg, and [name] right anterior lower leg wound. ABIs in clinic were noncompressible. Electronic Signature(s) Signed: 02/13/2022 3:23:47 PM By: [name] MD FACS Previous Signature: 02/13/2022 1:40:51 PM Version By: [name] MD FACS Entered By: [name] on 02/13/2022  15:23:46 -------------------------------------------------------------------------------- Physical Exam Details Patient Name: Date of Service: [name], [name] [name]. 02/13/2022 1:30 PM Medical Record Number: [id number] Patient Account Number: [id number] Date of Birth/Sex: Treating RN: 09/20/1945 (76 y.o. M) Primary Care Provider: [name] Other Clinician: Referring Provider: Treating Provider/Extender: [name] in Treatment: 0 Constitutional Slightly hypertensive. . . . No acute distress. [place], [place] [name] ([id number]) [id number] Page 5 of 14 Respiratory Normal work of breathing on supplemental oxygen. Notes 02/13/2022: He has severe, at least stage II, lymphedema. In addition to the dorsal foot wound, he has ulcers on both lower legs. He has [name] left anterior lower leg, [name] right lateral lower leg, and [name] right anterior lower leg wound. Electronic Signature(s) Signed: 02/13/2022 3:24:24 PM By: [name] MD FACS Entered By: [name] on 02/13/2022 15:24:24 -------------------------------------------------------------------------------- Physician Orders Details Patient Name: Date of Service: [name], [name] [name]. 02/13/2022 1:30 PM Medical Record Number: [id number] Patient Account Number: [id number] Date of Birth/Sex: Treating RN: September 06, 1945 (76 y.o. [name] Primary Care Provider: [name] Other Clinician: Referring Provider: Treating Provider/Extender: [name] in Treatment: 0 Verbal / Phone Orders: No Diagnosis Coding ICD-10 Coding Code Description [phone] Non-pressure chronic ulcer of other part of right foot with fat layer exposed L97.812 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d L97.822 Non-pressure chronic ulcer of other part of left lower leg with fat layer exposed I50.30 Unspecified diastolic (congestive) heart  failure J96.11 Chronic respiratory failure with hypoxia L03.115 Cellulitis of right lower limb E11.621 Type 2 diabetes mellitus with foot ulcer E66.01 Morbid (severe) obesity due to excess calories I89.0 Lymphedema, not elsewhere classified Follow-up Appointments ppointment in 1 week. - Dr. Celine Ahr - room 2 Return [name] Anesthetic (In clinic) Topical Lidocaine 5% applied to wound bed Bathing/ Shower/ Hygiene May shower with protection but do not get wound dressing(s) wet. - can purchase [name] cast protector from [facility], [facility], [employer] Edema Control - Lymphedema / SCD / Other Elevate legs to the level of the heart or above for 30 minutes daily and/or when sitting, [name] frequency of: Avoid standing for long periods of time. Exercise regularly Wound Treatment Wound #1 - Foot Wound Laterality: Dorsal, Right Peri-Wound Care: Sween Lotion (Moisturizing lotion) 1 x Per Week/30 Days Discharge Instructions: Apply moisturizing lotion as directed Prim Dressing: Hydrofera Blue Ready Foam, 2.5 x2.5 in 1 x Per Week/30 Days ary Discharge Instructions: Apply to wound bed as instructed Secured With: Transpore Surgical Tape, 2x10 (in/yd) 1 x Per Week/30 Days Discharge Instructions: Secure dressing with tape as directed. Wound #2 - Lower Leg Wound Laterality: Right, Anterior Cleanser: Soap and Water 1 x Per Week/30 Days Discharge Instructions: May shower and wash wound with dial antibacterial soap and water prior to dressing change. Cleanser: Wound Cleanser 1 x Per Week/30 Days [place], [name] [name] ([id number]) [id number] Page 6 of 14 Discharge Instructions: Cleanse the wound with wound cleanser prior to applying [name] clean dressing using gauze sponges, not tissue  or cotton balls. Peri-Wound Care: Sween Lotion (Moisturizing lotion) 1 x Per Week/30 Days Discharge Instructions: Apply moisturizing lotion as directed Prim Dressing: Hydrofera Blue Ready Foam, 2.5 x2.5 in 1 x Per Week/30  Days ary Discharge Instructions: Apply to wound bed as instructed Secondary Dressing: ABD Pad, 5x9 1 x Per Week/30 Days Discharge Instructions: Apply over primary dressing as directed. Secondary Dressing: Woven Gauze Sponge, Non-Sterile 4x4 in 1 x Per Week/30 Days Discharge Instructions: Apply over primary dressing as directed. Secured With: Transpore Surgical Tape, 2x10 (in/yd) 1 x Per Week/30 Days Discharge Instructions: Secure dressing with tape as directed. Compression Stockings: Jobst Farrow Wrap 4000 (DME) Right Leg Compression Amount: 30-40 mmHG Discharge Instructions: Apply Francia Greaves daily as instructed. Apply first thing in the morning, remove at night before bed. Wound #3 - Lower Leg Wound Laterality: Right, Lateral Peri-Wound Care: Sween Lotion (Moisturizing lotion) 1 x Per Week/30 Days Discharge Instructions: Apply moisturizing lotion as directed Prim Dressing: Hydrofera Blue Ready Foam, 2.5 x2.5 in 1 x Per Week/30 Days ary Discharge Instructions: Apply to wound bed as instructed Secondary Dressing: ABD Pad, 5x9 1 x Per Week/30 Days Discharge Instructions: Apply over primary dressing as directed. Secondary Dressing: Woven Gauze Sponge, Non-Sterile 4x4 in 1 x Per Week/30 Days Discharge Instructions: Apply over primary dressing as directed. Secured With: Transpore Surgical Tape, 2x10 (in/yd) 1 x Per Week/30 Days Discharge Instructions: Secure dressing with tape as directed. Compression Wrap: ThreePress (3 layer compression wrap) 1 x Per Week/30 Days Discharge Instructions: Apply three layer compression as directed. Wound #4 - Lower Leg Wound Laterality: Left, Anterior Cleanser: Soap and Water 1 x Per Week/30 Days Discharge Instructions: May shower and wash wound with dial antibacterial soap and water prior to dressing change. Cleanser: Wound Cleanser 1 x Per Week/30 Days Discharge Instructions: Cleanse the wound with wound cleanser prior to applying [name] clean dressing using  gauze sponges, not tissue or cotton balls. Peri-Wound Care: Sween Lotion (Moisturizing lotion) 1 x Per Week/30 Days Discharge Instructions: Apply moisturizing lotion as directed Prim Dressing: Hydrofera Blue Ready Foam, 2.5 x2.5 in 1 x Per Week/30 Days ary Discharge Instructions: Apply to wound bed as instructed Secondary Dressing: ABD Pad, 5x9 1 x Per Week/30 Days Discharge Instructions: Apply over primary dressing as directed. Secondary Dressing: Woven Gauze Sponge, Non-Sterile 4x4 in 1 x Per Week/30 Days Discharge Instructions: Apply over primary dressing as directed. Secured With: Transpore Surgical Tape, 2x10 (in/yd) 1 x Per Week/30 Days Discharge Instructions: Secure dressing with tape as directed. Compression Wrap: ThreePress (3 layer compression wrap) 1 x Per Week/30 Days Discharge Instructions: Apply three layer compression as directed. Compression Stockings: Jobst Farrow Wrap 4000 (DME) Left Leg Compression Amount: 30-40 mmHG Discharge Instructions: Apply Francia Greaves daily as instructed. Apply first thing in the morning, remove at night before bed. Patient Medications llergies: morphine, penicillin, codeine, propoxyphene, Darvocet-N [name] Notifications Medication Indication Start End 02/13/2022 lidocaine [place], [place] [name] ([id number]) [id number] Page 7 of 14 DOSE topical 4 % cream - cream topical Electronic Signature(s) Signed: 02/14/2022 7:39:34 AM By: [name] MD FACS Signed: 02/14/2022 7:40:26 AM By: Adline Peals Entered By: Adline Peals on 02/13/2022 16:30:45 -------------------------------------------------------------------------------- Problem List Details Patient Name: Date of Service: [name], [name] [name]. 02/13/2022 1:30 PM Medical Record Number: [id number] Patient Account Number: [id number] Date of Birth/Sex: Treating RN: 1945-04-06 (75 y.o. M) Primary Care Provider: [name] Other Clinician: Referring  Provider: Treating Provider/Extender: [name] in Treatment: 0 Active Problems ICD-10 Encounter Code  Description Active Date MDM Diagnosis L97.512 Non-pressure chronic ulcer of other part of right foot with fat layer exposed 02/13/2022 No Yes L97.812 Non-pressure chronic ulcer of other part of right lower leg with fat layer 02/13/2022 No Yes exposed L97.822 Non-pressure chronic ulcer of other part of left lower leg with fat layer exposed11/13/2023 No Yes I50.30 Unspecified diastolic (congestive) heart failure 02/13/2022 No Yes J96.11 Chronic respiratory failure with hypoxia 02/13/2022 No Yes L03.115 Cellulitis of right lower limb 02/13/2022 No Yes E11.621 Type 2 diabetes mellitus with foot ulcer 02/13/2022 No Yes E66.01 Morbid (severe) obesity due to excess calories 02/13/2022 No Yes I89.0 Lymphedema, not elsewhere classified 02/13/2022 No Yes Inactive Problems Resolved Problems Electronic Signature(s) [name] [name], [name] [name] ([id number]) [id number] Page 8 of 14 Signed: 02/13/2022 3:11:04 PM By: [name] MD FACS Previous Signature: 02/13/2022 3:08:39 PM Version By: [name] MD FACS Previous Signature: 02/13/2022 1:35:19 PM Version By: [name] MD FACS Entered By: [name] on 02/13/2022 15:11:03 -------------------------------------------------------------------------------- Progress Note Details Patient Name: Date of Service: [name], [name] [name]. 02/13/2022 1:30 PM Medical Record Number: [id number] Patient Account Number: [id number] Date of Birth/Sex: Treating RN: 10-18-1945 (76 y.o. M) Primary Care Provider: [name] Other Clinician: Referring Provider: Treating Provider/Extender: [name] in Treatment: 0 Subjective Chief Complaint Information obtained from Patient Patients presents for treatment of open ulcers secondary to lymphedema and diabetes History of  Present Illness (HPI) ADMISSION 02/13/2022 This is [name] 76 year old morbidly obese type II diabetic (last hemoglobin A1c 6.6%) who presented to his primary care provider's office on November 7 with an ulcer on the top of his foot. He said it had been there for about [name] week. He is insensate in his feet and his wife had been caring for it. His PCP took [name] swab from the wound which grew back strep. [name] was prescribed and he was referred to the wound care center for further evaluation and management. In addition to diabetes, he also has pulmonary artery hypertension and diastolic congestive heart failure. 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 He has been seen at the voice and swallowing disorders clinic in [place] and no further intervention has been felt necessary. On exam, he has severe, at least stage II, lymphedema. In addition to the dorsal foot wound, he has ulcers on both lower legs. He has [name] left anterior lower leg, [name] right lateral lower leg, and [name] right anterior lower leg wound. ABIs in clinic were noncompressible. Patient History Information obtained from Patient. Allergies morphine (Reaction: n/v), penicillin, codeine (Reaction: nausea), propoxyphene (Reaction: nausea), Darvocet-N Family History Cancer - Paternal Grandparents, Heart Disease - Maternal Grandparents,Father, No family history of Diabetes, Hereditary Spherocytosis, Hypertension, Kidney Disease, Lung Disease, Seizures, Stroke, Thyroid Problems, Tuberculosis. Social History Former smoker - smoked as [name] teen, Marital Status - Married, Alcohol Use - Never, Drug Use - No History, Caffeine Use - Never. Medical History Hematologic/Lymphatic Patient has history of Anemia, Lymphedema Respiratory Patient has history of Sleep Apnea Cardiovascular Patient has history of Arrhythmia - PAF, Congestive Heart Failure - diastolic, Hypertension Endocrine Patient has history of  Type II Diabetes - takes no meds, "borderline A1c" Neurologic Patient has history of Neuropathy Oncologic Denies history of Received Chemotherapy, Received Radiation Blood sugar is not tested. Hospitalization/Surgery History - right and left heart cath. - colonoscopy. - esophagogastroduodenoscopy. - subxyphoid pericardial window. - intraoperative transesophageal echocardiogram. - laparoscopic gastric banding with hiatal hernia repair. -  breath tek h pylori. - cholecysectomy. - arthroscopy knee w/ drilling. - lithotripsy. - tonsillectomy. - total knee replacement. - trigger finger repair. - wisdom tooth extraction. Medical [name] Surgical History Notes nd Constitutional Symptoms (General Health) obsesity Hematologic/Lymphatic chronic anticoagulation Cardiovascular pericarditis Gastrointestinal pt says 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 but they could not prove it [place], [place] ([id number]) [id number] Page 9 of 14 BPH Neurologic 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, embolic cerebral infarction, CVA Psychiatric depression Review of Systems (ROS) Constitutional Symptoms (General Health) Denies complaints or symptoms of Fatigue, Fever, Chills, Marked Weight Change. Eyes Complains or has symptoms of Glasses / Contacts. Ear/Nose/Mouth/Throat Denies complaints or symptoms of Chronic sinus problems or rhinitis. Respiratory Complains or has symptoms of Shortness of Breath. Integumentary (Skin) Complains or has symptoms of Wounds. Musculoskeletal Complains or has symptoms of Muscle Pain, Muscle Weakness. Oncologic hx of skin cancer Psychiatric Denies complaints or symptoms of Claustrophobia. Objective Constitutional Slightly hypertensive. No acute distress. Vitals Time Taken: 1:38 PM, Height: 72 in, Source: Stated, Weight: 375 lbs, Source: Stated, BMI: 50.9, Temperature: 98.5 F, Pulse: 60 bpm, Respiratory Rate: 22 breaths/min, Blood  Pressure: 142/71 mmHg. Respiratory Normal work of breathing on supplemental oxygen. General Notes: 02/13/2022: He has severe, at least stage II, lymphedema. In addition to the dorsal foot wound, he has ulcers on both lower legs. He has [name] left anterior lower leg, [name] right lateral lower leg, and [name] right anterior lower leg wound. Integumentary (Hair, Skin) Wound #1 status is Open. Original cause of wound was Blister. The date acquired was: 01/31/2022. The wound is located on the Right,Dorsal Foot. The wound measures 3.5cm length x 4.1cm width x 0.1cm depth; 11.27cm^2 area and 1.127cm^3 volume. There is Fat Layer (Subcutaneous Tissue) exposed. There is no tunneling or undermining noted. There is [name] medium amount of serosanguineous drainage noted. The wound margin is distinct with the outline attached to the wound base. There is small (1-33%) red granulation within the wound bed. There is [name] large (67-100%) amount of necrotic tissue within the wound bed including Eschar and Adherent Slough. The periwound skin appearance had no abnormalities noted for texture. The periwound skin appearance exhibited: Dry/Scaly, Erythema. The surrounding wound skin color is noted with erythema. Periwound temperature was noted as No Abnormality. Wound #2 status is Open. Original cause of wound was Blister. The date acquired was: 02/13/2021. The wound is located on the Right,Anterior Lower Leg. The wound measures 2.5cm length x 1.7cm width x 0.1cm depth; 3.338cm^2 area and 0.334cm^3 volume. There is Fat Layer (Subcutaneous Tissue) exposed. There is no tunneling or undermining noted. There is [name] medium amount of serosanguineous drainage noted. The wound margin is distinct with the outline attached to the wound base. There is large (67-100%) red, pink granulation within the wound bed. There is [name] small (1-33%) amount of necrotic tissue within the wound bed including Adherent Slough. The periwound skin appearance had no abnormalities  noted for texture. The periwound skin appearance exhibited: Dry/Scaly, Hemosiderin Staining. Periwound temperature was noted as No Abnormality. Wound #3 status is Open. Original cause of wound was Blister. The date acquired was: 02/13/2021. The wound is located on the Right,Lateral Lower Leg. The wound measures 1.4cm length x 1cm width x 0.1cm depth; 1.1cm^2 area and 0.11cm^3 volume. There is Fat Layer (Subcutaneous Tissue) exposed. There is no tunneling or undermining noted. There is [name] medium amount of serosanguineous drainage noted. The wound margin is distinct with the outline attached to the wound base. There  is small (1-33%) red granulation within the wound bed. There is [name] large (67-100%) amount of necrotic tissue within the wound bed including Eschar and Adherent Slough. The periwound skin appearance had no abnormalities noted for texture. The periwound skin appearance exhibited: Dry/Scaly, Hemosiderin Staining. Periwound temperature was noted as No Abnormality. Wound #4 status is Open. Original cause of wound was Blister. The date acquired was: 02/13/2021. The wound is located on the Left,Anterior Lower Leg. The wound measures 7cm length x 1.5cm width x 0.1cm depth; 8.247cm^2 area and 0.825cm^3 volume. There is Fat Layer (Subcutaneous Tissue) exposed. There is no tunneling or undermining noted. There is [name] medium amount of serosanguineous drainage noted. The wound margin is distinct with the outline attached to the wound base. There is large (67-100%) red granulation within the wound bed. There is [name] small (1-33%) amount of necrotic tissue within the wound bed including Adherent Slough. The periwound skin appearance had no abnormalities noted for texture. The periwound skin appearance exhibited: Dry/Scaly, Hemosiderin Staining, Erythema. The surrounding wound skin color is noted with erythema. Periwound temperature was noted as No Abnormality. 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 There was [name] Excisional Skin/Subcutaneous Tissue Debridement with [name] total area of 14.35 sq cm performed by [name], MD. With the following instrument(s): Curette to remove Non-Viable tissue/material. Material removed includes Eschar, Subcutaneous Tissue, and Slough after achieving pain control using Lidocaine 5% topical ointment. No specimens were taken. [name] time out was conducted at 14:52, prior to the start of the procedure. [name] Minimum amount of bleeding was controlled with Pressure. The procedure was tolerated well. Post Debridement Measurements: 3.5cm length x 4.1cm width x 0.1cm depth; 1.127cm^3 volume. Character of Wound/Ulcer Post Debridement is improved. Severity of Tissue Post Debridement is: Fat layer exposed. Post procedure Diagnosis Wound #1: Same as Pre-Procedure General Notes: scribed for Dr. Celine Ahr by Adline Peals, RN. Wound #2 Pre-procedure diagnosis of Wound #2 is [name] Lymphedema located on the Right,Anterior Lower Leg . There was [name] Excisional Skin/Subcutaneous Tissue Debridement with [name] total area of 4.25 sq cm performed by [name], MD. With the following instrument(s): Curette to remove Non-Viable tissue/material.  Material removed includes Subcutaneous Tissue and Slough and after achieving pain control using Lidocaine 5% topical ointment. No specimens were taken. [name] time out was conducted at 14:52, prior to the start of the procedure. [name] Minimum amount of bleeding was controlled with Pressure. The procedure was tolerated well. Post Debridement Measurements: 2.5cm length x 1.7cm width x 0.1cm depth; 0.334cm^3 volume. Character of Wound/Ulcer Post Debridement is improved. Post procedure Diagnosis Wound #2: Same as Pre-Procedure General Notes: scribed for Dr. Celine Ahr by Adline Peals, RN. Wound #3 Pre-procedure diagnosis of Wound #3 is [name] Lymphedema located on the Right,Lateral Lower Leg . There was [name] Selective/Open Wound Non-Viable Tissue Debridement with [name] total area of 6 sq cm  performed by [name], MD. With the following instrument(s): Curette to remove Non-Viable tissue/material. Material removed includes Eschar and Slough and after achieving pain control using Lidocaine 5% topical ointment. No specimens were taken. [name] time out was conducted at 14:52, prior to the start of the procedure. [name] Minimum amount of bleeding was controlled with Pressure. The procedure was tolerated well. Post Debridement Measurements: 1.4cm length x 1cm width x 0.1cm depth; 0.11cm^3 volume. Character of Wound/Ulcer Post Debridement is improved. Post procedure Diagnosis Wound #3: Same as Pre-Procedure General Notes: scribed for Dr. Celine Ahr by Adline Peals, RN. Wound #4 Pre-procedure diagnosis of Wound #4 is [name] Lymphedema located on the Left,Anterior Lower Leg . There was [name] Excisional Skin/Subcutaneous Tissue Debridement with [name] total area of 6 sq cm performed by [name], MD. With the following instrument(s): Curette to remove Non-Viable tissue/material. Material removed includes Subcutaneous Tissue and Slough and after achieving pain control using Lidocaine 5% topical ointment. No specimens were taken. [name] time out  was conducted at 14:52, prior to the start of the procedure. [name] Minimum amount of bleeding was controlled with Pressure. The procedure was tolerated well. Post Debridement Measurements: 7cm length x 1.5cm width x 0.1cm depth; 0.825cm^3 volume. Character of Wound/Ulcer Post Debridement is improved. Post procedure Diagnosis Wound #4: Same as Pre-Procedure General Notes: scribed for Dr. Celine Ahr by Adline Peals, RN. Plan Follow-up Appointments: Return Appointment in 1 week. - Dr. Celine Ahr - room 2 Anesthetic: (In clinic) Topical Lidocaine 5% applied to wound bed Bathing/ Shower/ Hygiene: May shower with protection but do not get wound dressing(s) wet. - can purchase [name] cast protector from [facility], [facility], [employer] Edema Control - Lymphedema / SCD / Other: Elevate legs to the level of the heart or above for 30 minutes daily and/or when sitting, [name] frequency of: Avoid standing for long periods of time. Exercise regularly The following medication(s) was prescribed: lidocaine topical 4 % cream cream topical was prescribed at facility WOUND #1: - Foot Wound Laterality: Dorsal, Right Peri-Wound Care: Sween Lotion (Moisturizing lotion) 1 x Per Week/30 Days Discharge Instructions: Apply moisturizing lotion as directed Prim Dressing: Hydrofera Blue Ready Foam, 2.5 x2.5 in 1 x Per Week/30 Days ary Discharge Instructions: Apply to wound bed as instructed Secured With: Transpore Surgical T ape, 2x10 (in/yd) 1 x Per Week/30 Days Discharge Instructions: Secure dressing with tape as directed. Com pression Wrap: ThreePress (3 layer compression wrap) 1 x Per Week/30 Days Discharge Instructions: Apply three layer compression as directed. WOUND #2: - Lower Leg Wound Laterality: Right, Anterior Cleanser: Soap and Water 1 x Per Week/30 Days Discharge Instructions: May shower and wash wound with dial antibacterial soap and water prior to dressing change. Cleanser: Wound Cleanser 1 x Per Week/30 Days Discharge  Instructions: Cleanse the wound with wound cleanser prior to applying [name] clean dressing using gauze sponges, not tissue or cotton balls. Peri-Wound Care: Sween Lotion (Moisturizing lotion) 1 x Per Week/30 Days Discharge Instructions: Apply moisturizing lotion as directed Prim Dressing: Hydrofera Blue Ready Foam, 2.5 x2.5 in 1 x Per Week/30 Days ary Discharge Instructions: Apply to wound bed as instructed Secondary Dressing: ABD Pad, 5x9 1 x Per Week/30 Days [name] [name], [name] [name] ([id number]) [id number] Page 11 of 14 Discharge Instructions: Apply over primary dressing as directed. Secondary Dressing: Woven Gauze Sponge, Non-Sterile 4x4 in 1 x Per Week/30 Days Discharge Instructions: Apply over primary dressing as directed. Secured With: Transpore Surgical T ape, 2x10 (in/yd) 1 x Per Week/30 Days Discharge Instructions: Secure dressing with tape  as directed. Com pression Wrap: ThreePress (3 layer compression wrap) 1 x Per Week/30 Days Discharge Instructions: Apply three layer compression as directed. WOUND #3: - Lower Leg Wound Laterality: Right, Lateral Peri-Wound Care: Sween Lotion (Moisturizing lotion) 1 x Per Week/30 Days Discharge Instructions: Apply moisturizing lotion as directed Prim Dressing: Hydrofera Blue Ready Foam, 2.5 x2.5 in 1 x Per Week/30 Days ary Discharge Instructions: Apply to wound bed as instructed Secondary Dressing: ABD Pad, 5x9 1 x Per Week/30 Days Discharge Instructions: Apply over primary dressing as directed. Secondary Dressing: Woven Gauze Sponge, Non-Sterile 4x4 in 1 x Per Week/30 Days Discharge Instructions: Apply over primary dressing as directed. Secured With: Transpore Surgical T ape, 2x10 (in/yd) 1 x Per Week/30 Days Discharge Instructions: Secure dressing with tape as directed. Com pression Wrap: ThreePress (3 layer compression wrap) 1 x Per Week/30 Days Discharge Instructions: Apply three layer compression as directed. WOUND #4:  - Lower Leg Wound Laterality: Left, Anterior Cleanser: Soap and Water 1 x Per Week/30 Days Discharge Instructions: May shower and wash wound with dial antibacterial soap and water prior to dressing change. Cleanser: Wound Cleanser 1 x Per Week/30 Days Discharge Instructions: Cleanse the wound with wound cleanser prior to applying [name] clean dressing using gauze sponges, not tissue or cotton balls. Peri-Wound Care: Sween Lotion (Moisturizing lotion) 1 x Per Week/30 Days Discharge Instructions: Apply moisturizing lotion as directed Prim Dressing: Hydrofera Blue Ready Foam, 2.5 x2.5 in 1 x Per Week/30 Days ary Discharge Instructions: Apply to wound bed as instructed Secondary Dressing: ABD Pad, 5x9 1 x Per Week/30 Days Discharge Instructions: Apply over primary dressing as directed. Secondary Dressing: Woven Gauze Sponge, Non-Sterile 4x4 in 1 x Per Week/30 Days Discharge Instructions: Apply over primary dressing as directed. Secured With: Transpore Surgical T ape, 2x10 (in/yd) 1 x Per Week/30 Days Discharge Instructions: Secure dressing with tape as directed. Com pression Wrap: ThreePress (3 layer compression wrap) 1 x Per Week/30 Days Discharge Instructions: Apply three layer compression as directed. 02/13/2022: This is [name] 76 year old morbidly obese diabetic with severe lymphedema. He presents to clinic with multiple ulcers. He has severe, at least stage II, lymphedema. In addition to the dorsal foot wound, he has ulcers on both lower legs. He has [name] left anterior lower leg, [name] right lateral lower leg, and [name] right anterior lower leg wound. I used [name] curette to debride slough, eschar, and nonviable subcutaneous tissue from his wounds. He would benefit from compression therapy, but says he has never even tried to wear compression stockings. Given his obesity and pulmonary issues, I suspect that he is unable to reach his feet. He does have help in the form of his wife and may be able to apply [place] wraps  or similar. He may also benefit from lymphedema pumps, but first she will need [name] trial of regular compression garments. He also has his legs in [name] dependent patient addition throughout the day. We brainstormed ideas for him to make [name] better effort towards leg elevation throughout the day. We will use Hydrofera Blue ready foam and 3 layer compression bilaterally. Follow-up in 1 week. Electronic Signature(s) Signed: 02/13/2022 3:30:52 PM By: [name] MD FACS Entered By: [name] on 02/13/2022 15:30:52 -------------------------------------------------------------------------------- HxROS Details Patient Name: Date of Service: [name] [name] 02/13/2022 1:30 PM Medical Record Number: [id number] Patient Account Number: [id number] Date of Birth/Sex: Treating RN: 24-Mar-1946 (76 y.o. [name] Primary Care Provider: [name] Other Clinician: Referring Provider: Treating Provider/Extender: Asencion Islam,  Estanislado Pandy in Treatment: 0 Information Obtained From Patient Constitutional Symptoms (General Health) Complaints and Symptoms: Negative for: Fatigue; Fever; Chills; Marked Weight Change Medical History: Past Medical History NotesKidus [name], [name] [name] ([id number]) [id number] Page 12 of 14 Eyes Complaints and Symptoms: Positive for: Glasses / Contacts Ear/Nose/Mouth/Throat Complaints and Symptoms: Negative for: Chronic sinus problems or rhinitis Respiratory Complaints and Symptoms: Positive for: Shortness of Breath Medical History: Positive for: Sleep Apnea Integumentary (Skin) Complaints and Symptoms: Positive for: Wounds Musculoskeletal Complaints and Symptoms: Positive for: Muscle Pain; Muscle Weakness Psychiatric Complaints and Symptoms: Negative for: Claustrophobia Medical History: Past Medical History Notes: depression Hematologic/Lymphatic Medical History: Positive for: Anemia;  Lymphedema Past Medical History Notes: chronic anticoagulation Cardiovascular Medical History: Positive for: Arrhythmia - PAF; Congestive Heart Failure - diastolic; Hypertension Past Medical History Notes: pericarditis Gastrointestinal Medical History: Past Medical History Notes: pt says 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 but they could not prove it Endocrine Medical History: Positive for: Type II Diabetes - takes no meds, "borderline A1c" Time with diabetes: 2-3 years ago Blood sugar tested every day: No Genitourinary Medical History: Past Medical History Notes: BPH Immunological Neurologic Medical History: Positive for: Neuropathy Past Medical History Notes: 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, embolic cerebral infarction, CVA Cremeens, [name] [name] ([id number]) [id number] Page 13 of 14 Oncologic Complaints and Symptoms: Review of System Notes: hx of skin cancer Medical History: Negative for: Received Chemotherapy; Received Radiation Immunizations Pneumococcal Vaccine: Received Pneumococcal Vaccination: Yes Received Pneumococcal Vaccination On or After 60th Birthday: Yes Implantable Devices None Hospitalization / Surgery History Type of Hospitalization/Surgery right and left heart cath colonoscopy esophagogastroduodenoscopy subxyphoid pericardial window intraoperative transesophageal echocardiogram laparoscopic gastric banding with hiatal hernia repair breath tek h pylori cholecysectomy arthroscopy knee w/ drilling lithotripsy tonsillectomy total knee replacement trigger finger repair wisdom tooth extraction Family and Social History Cancer: Yes - Paternal Grandparents; Diabetes: No; Heart Disease: Yes - Maternal Grandparents,Father; Hereditary Spherocytosis: No; Hypertension: No; Kidney Disease: No; Lung Disease: No; Seizures: No; Stroke: No; Thyroid Problems: No; Tuberculosis: No; Former smoker - smoked as [name] teen;  Marital Status - Married; Alcohol Use: Never; Drug Use: No History; Caffeine Use: Never; Financial Concerns: No; Food, Clothing or Shelter Needs: No; Support System Lacking: No; Transportation Concerns: No Electronic Signature(s) Signed: 02/14/2022 7:39:34 AM By: [name] MD FACS Signed: 02/14/2022 7:40:26 AM By: Adline Peals Entered By: Adline Peals on 02/13/2022 14:02:22 -------------------------------------------------------------------------------- SuperBill Details Patient Name: Date of Service: [name], Giovan [name]. 02/13/2022 Medical Record Number: [id number] Patient Account Number: [id number] Date of Birth/Sex: Treating RN: [birth year] (76 y.o. M) Primary Care Provider: [name] Other Clinician: Referring Provider: Treating Provider/Extender: [name] in Treatment: 0 Diagnosis Coding ICD-10 Codes Code Description [phone] Non-pressure chronic ulcer of other part of right foot with fat layer exposed L97.812 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d L97.822 Non-pressure chronic ulcer of other part of left lower leg with fat layer exposed I50.30 Unspecified diastolic (congestive) heart failure J96.11 Chronic respiratory failure with hypoxia L03.115 Cellulitis of right lower limb [place], Shakim [name] ([id number]) [id number] Page 14 of 14 E11.621 Type 2 diabetes mellitus with foot ulcer E66.01 Morbid (severe) obesity due to excess calories I89.0 Lymphedema, not elsewhere classified Facility Procedures : CPT4 Code: [id number] Description: 67672 - DEB SUBQ TISSUE 20 SQ CM/< ICD-10 Diagnosis Description L97.512 Non-pressure chronic ulcer of other part of right foot with fat layer exposed L97.812 Non-pressure chronic ulcer of other part of  right lower leg with fat layer expos Modifier: ed Quantity: 1 : CPT4 Code: [id number] Description: 56387 - DEB SUBQ TISS EA ADDL  20CM ICD-10 Diagnosis Description L97.512 Non-pressure chronic ulcer of other part of right foot with fat layer exposed L97.812 Non-pressure chronic ulcer of other part of right lower leg with fat layer expos Modifier: ed Quantity: 1 : CPT4 Code: [id number] Description: 18841 - DEBRIDE WOUND 1ST 20 SQ CM OR < ICD-10 Diagnosis Description L97.822 Non-pressure chronic ulcer of other part of left lower leg with fat layer expose Modifier: d Quantity: 1 Physician Procedures : CPT4 Code Description Modifier [id number] - WC PHYS LEVEL 4 - NEW PT 25 ICD-10 Diagnosis Description L97.512 Non-pressure chronic ulcer of other part of right foot with fat layer exposed L97.812 Non-pressure chronic ulcer of other part of right  lower leg with fat layer exposed L97.822 Non-pressure chronic ulcer of other part of left lower leg with fat layer exposed I89.0 Lymphedema, not elsewhere classified Quantity: 1 : [id number] 11042 - WC PHYS SUBQ TISS 20 SQ CM ICD-10 Diagnosis Description L97.512 Non-pressure chronic ulcer of other part of right foot with fat layer exposed L97.812 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d Quantity: 1 : [id number] 11045 - WC PHYS SUBQ TISS EA ADDL 20 CM ICD-10 Diagnosis Description L97.512 Non-pressure chronic ulcer of other part of right foot with fat layer exposed L97.812 Non-pressure chronic ulcer of other part of right lower leg with fat layer  exposed Quantity: 1 : [id number] 62376 - WC PHYS DEBR WO ANESTH 20 SQ CM ICD-10 Diagnosis Description L97.822 Non-pressure chronic ulcer of other part of left lower leg with fat layer exposed Quantity: 1 Electronic Signature(s) Signed: 02/13/2022 3:32:02 PM By: [name] MD FACS Entered By: [name] on 02/13/2022 15:32:01

## 2022-02-15 ENCOUNTER — Ambulatory Visit: Payer: Medicare Other | Attending: General Practice | Admitting: Nurse Practitioner

## 2022-02-15 ENCOUNTER — Encounter: Payer: Self-pay | Admitting: Nurse Practitioner

## 2022-02-15 VITALS — BP 136/78 | HR 58 | Ht 72.0 in | Wt 370.0 lb

## 2022-02-15 DIAGNOSIS — I272 Pulmonary hypertension, unspecified: Secondary | ICD-10-CM | POA: Insufficient documentation

## 2022-02-15 DIAGNOSIS — I480 Paroxysmal atrial fibrillation: Secondary | ICD-10-CM | POA: Diagnosis not present

## 2022-02-15 DIAGNOSIS — Z7901 Long term (current) use of anticoagulants: Secondary | ICD-10-CM | POA: Diagnosis not present

## 2022-02-15 DIAGNOSIS — I10 Essential (primary) hypertension: Secondary | ICD-10-CM | POA: Diagnosis not present

## 2022-02-15 DIAGNOSIS — I5032 Chronic diastolic (congestive) heart failure: Secondary | ICD-10-CM | POA: Diagnosis not present

## 2022-02-15 DIAGNOSIS — Z79899 Other long term (current) drug therapy: Secondary | ICD-10-CM | POA: Diagnosis not present

## 2022-02-15 NOTE — Patient Instructions (Signed)
Medication Instructions:   Your physician recommends that you continue on your current medications as directed. Please refer to the Current Medication list given to you today.   *If you need a refill on your cardiac medications before your next appointment, please call your pharmacy*   Lab Work:  None ordered.  If you have labs (blood work) drawn today and your tests are completely normal, you will receive your results only by: [place] (if you have MyChart) OR A paper copy in the mail If you have any lab test that is abnormal or we need to change your treatment, we will call you to review the results.   Testing/Procedures:  None ordered.   Follow-Up: At [facility], you and your health needs are our priority.  As part of our continuing mission to provide you with exceptional heart care, we have created designated Provider Care Teams.  These Care Teams include your primary Cardiologist (physician) and Advanced Practice Providers (APPs -  Physician Assistants and Nurse Practitioners) who all work together to provide you with the care you need, when you need it.  We recommend signing up for the patient portal called "MyChart".  Sign up information is provided on this After Visit Summary.  MyChart is used to connect with patients for Virtual Visits (Telemedicine).  Patients are able to view lab/test results, encounter notes, upcoming appointments, etc.  Non-urgent messages can be sent to your provider as well.   To learn more about what you can do with MyChart, go to [email or web address].    Your next appointment:   6 month(s)  The format for your next appointment:   In Person  Provider:   Pixie Casino, MD     Other Instructions Vegetarian Eating Information Many people may prefer vegetarian eating for religious, environmental, and other personal reasons. These diets are often lower in salt, sugar, cholesterol, and saturated and trans fats. Vegetarian  eating provides significant health benefits. People who eat a vegetarian diet often have lower rates of: Obesity. Diabetes. Breast and colon cancers. Cardiovascular and gallbladder diseases. What are the types of vegetarian eating?  Vegetarian eating includes dietary choices that focus on eating mostly vegetables and fruit, grains, beans, nuts, and seeds. There are several different types of vegetarian eating. Talk with a dietitian about what type of vegetarian diet is best for you. Lacto-ovo vegetarian Recommended foods: fruits and vegetables, milk and dairy, eggs, grains, soy and vegetable protein, beans, nuts, and seeds. Foods to avoid: meat, poultry, seafood, animal-based broths and gravies, and gelatin. Lacto-vegetarian Recommended foods: fruits and vegetables, milk and dairy, grains, soy and vegetable protein, beans, nuts, and seeds. Foods to avoid: meat, poultry, seafood, animal-based broths and gravies, gelatin, and eggs. Vegan Recommended foods: fruits and vegetables, grains, soy and vegetable protein, beans, nuts, and seeds. Foods to avoid: meat, poultry, seafood, animal-based broths and gravies, gelatin, eggs, milk and dairy, and honey. What do I need to know about vegetarian eating? All vegetarian diets restrict proteins that come from animals. Foods that come from animals have important nutrients, such as protein, fats, vitamins, and minerals. It is important to get these nutrients from other types of foods. When shopping, focus on choosing whole foods to prepare at home instead of buying packaged meals that can be high in salt (sodium), sugar, and fat. If you think you may not be getting the right nutrients, or if you do not eat any animal products, talk with your health care provider or dietitian about  taking supplements. A dietitian can help determine your individual nutrient needs. What are tips for following this plan? Eat a diet that includes a variety of fruits, vegetables,  whole grains, and protein sources. This is important to make sure you get enough of the following nutrients: Protein Healthy protein sources include: Eggs, milk, and cheese. Soy products. Tofu, tempeh, and textured vegetable protein (TVP). Quinoa. Hemp seeds. Other protein sources include: Beans, such as black beans or kidney beans. Other legumes, such as lentils and split peas. Nuts, such as almonds, [place] nuts, and pecans. Seeds, such as sunflower seeds. To get the most benefit from plant-based proteins, combine two or more sources of plant protein with whole grains in one dish. Examples include beans and rice, almond butter on bread, or sunflower seeds on noodles. Vitamin B12 Sources of vitamin B12 include: Cheese and eggs. Breakfast cereals and other prepared products that have vitamin B12 added (fortified products). If you eat a vegan diet, ask your health care provider or dietitian about taking a B12 supplement. Vitamin D Good sources of vitamin D include: Egg yolks. Fortified dairy products. Fortified orange juice. Mushrooms. Cereals with added vitamin D. Another way of getting vitamin D is to spend 10 minutes each day in the sun. This helps your body make its own vitamin D. Depending on your age, you may need to take a vitamin D supplement. Talk with your health care provider or dietitian about how much vitamin D you need. Iron Healthy sources of iron include: Dark, leafy greens. Nuts. Beans. Grain products that are fortified with iron, such as cereals. Tofu, tempeh, soybeans, and quinoa. To get the most iron from plant-based foods: Eat iron-containing plant-based foods with vitamin C. For example, squeeze fresh lemon juice over cooked greens like kale, chard, or spinach, or have a glass of orange juice with your meals. Avoid eating dairy products, coffee, or tea with iron-containing foods. Omega-3 fatty acids Good sources of omega-3 fatty acids include: Walnuts. Flaxseeds, canola  oil, soybean oil, and tofu. Avocados. Olives and olive oil. Foods with added omega-3 fatty acids, such as eggs, milk, and juices. Calcium Good sources of calcium include: Dairy products. Fortified nondairy milk. Fortified tofu. Dark, leafy greens, such as kale, bok choy, Chinese cabbage, collard greens, and mustard greens. Broccoli. Okra. Fortified breakfast cereals and fruit juices. Figs. Zinc Good sources of zinc include: Pumpkin seeds. Legumes, such as chickpeas, kidney beans, and green peas. Wheat germ, whole grains, and fortified cereals. Mushrooms. Spinach and kale. Milk and dairy foods. Dark chocolate. Summary Many people may prefer vegetarian eating for religious, environmental, and other personal reasons. These diets can provide significant health benefits. There are several types of vegetarian diets, but all restrict proteins that come from animals. It is important to make sure that you are getting enough nutrients, including protein, vitamin B12, vitamin D, iron, omega-3 fatty acids, calcium, and zinc, from your diet. If you think you are not getting the right nutrients or if you do not eat any animal products, talk with your health care provider or dietitian. This information is not intended to replace advice given to you by your health care provider. Make sure you discuss any questions you have with your health care provider. Document Revised: 03/10/2020 Document Reviewed: 03/10/2020 Elsevier Patient Education  [place] refers to food and lifestyle choices that are based on the traditions of countries located on the [employer]. It focuses on eating more fruits, vegetables,  whole grains, beans, nuts, seeds, and heart-healthy fats, and eating less dairy, meat, eggs, and processed foods with added sugar, salt, and fat. This way of eating has been shown to help prevent certain conditions and improve outcomes for people who have  chronic diseases, like kidney disease and heart disease. What are tips for following this plan? Reading food labels Check the serving size of packaged foods. For foods such as rice and pasta, the serving size refers to the amount of cooked product, not dry. Check the total fat in packaged foods. Avoid foods that have saturated fat or trans fats. Check the ingredient list for added sugars, such as corn syrup. Shopping  Buy a variety of foods that offer a balanced diet, including: Fresh fruits and vegetables (produce). Grains, beans, nuts, and seeds. Some of these may be available in unpackaged forms or large amounts (in bulk). Fresh seafood. Poultry and eggs. Low-fat dairy products. Buy whole ingredients instead of prepackaged foods. Buy fresh fruits and vegetables in-season from local farmers markets. Buy plain frozen fruits and vegetables. If you do not have access to quality fresh seafood, buy precooked frozen shrimp or canned fish, such as tuna, salmon, or sardines. Stock your pantry so you always have certain foods on hand, such as olive oil, canned tuna, canned tomatoes, rice, pasta, and beans. Cooking Cook foods with extra-virgin olive oil instead of using butter or other vegetable oils. Have meat as a side dish, and have vegetables or grains as your main dish. This means having meat in small portions or adding small amounts of meat to foods like pasta or stew. Use beans or vegetables instead of meat in common dishes like chili or lasagna. Experiment with different cooking methods. Try roasting, broiling, steaming, and sauting vegetables. Add frozen vegetables to soups, stews, pasta, or rice. Add nuts or seeds for added healthy fats and plant protein at each meal. You can add these to yogurt, salads, or vegetable dishes. Marinate fish or vegetables using olive oil, lemon juice, garlic, and fresh herbs. Meal planning Plan to eat one vegetarian meal one day each week. Try to work up  to two vegetarian meals, if possible. Eat seafood two or more times a week. Have healthy snacks readily available, such as: Vegetable sticks with hummus. Greek yogurt. Fruit and nut trail mix. Eat balanced meals throughout the week. This includes: Fruit: 2-3 servings a day. Vegetables: 4-5 servings a day. Low-fat dairy: 2 servings a day. Fish, poultry, or lean meat: 1 serving a day. Beans and legumes: 2 or more servings a week. Nuts and seeds: 1-2 servings a day. Whole grains: 6-8 servings a day. Extra-virgin olive oil: 3-4 servings a day. Limit red meat and sweets to only a few servings a month. Lifestyle  Cook and eat meals together with your family, when possible. Drink enough fluid to keep your urine pale yellow. Be physically active every day. This includes: Aerobic exercise like running or swimming. Leisure activities like gardening, walking, or housework. Get 7-8 hours of sleep each night. If recommended by your health care provider, drink red wine in moderation. This means 1 glass a day for nonpregnant women and 2 glasses a day for men. A glass of wine equals 5 oz (150 mL). What foods should I eat? Fruits Apples. Apricots. Avocado. Berries. Bananas. Cherries. Dates. Figs. Grapes. Lemons. Melon. Oranges. Peaches. Plums. Pomegranate. Vegetables Artichokes. Beets. Broccoli. Cabbage. Carrots. Eggplant. Green beans. Chard. Kale. Spinach. Onions. Leeks. Peas. Squash. Tomatoes. Peppers. Radishes. Grains Whole-grain pasta.  Brown rice. Bulgur wheat. Polenta. Couscous. Whole-wheat bread. [place]. Meats and other proteins Beans. Almonds. Sunflower seeds. Pine nuts. Peanuts. [place]. Salmon. Scallops. Shrimp. [place]. Tilapia. Clams. Oysters. Eggs. Poultry without skin. Dairy Low-fat milk. Cheese. Greek yogurt. Fats and oils Extra-virgin olive oil. Avocado oil. Grapeseed oil. Beverages Water. Red wine. Herbal tea. Sweets and desserts Greek yogurt with honey. Baked apples.  Poached pears. Trail mix. Seasonings and condiments Basil. Cilantro. Coriander. Cumin. Mint. Parsley. Sage. Rosemary. Tarragon. Garlic. Oregano. Thyme. Pepper. Balsamic vinegar. Tahini. Hummus. Tomato sauce. Olives. Mushrooms. The items listed above may not be a complete list of foods and beverages you can eat. Contact a dietitian for more information. What foods should I limit? This is a list of foods that should be eaten rarely or only on special occasions. Fruits Fruit canned in syrup. Vegetables Deep-fried potatoes (french fries). Grains Prepackaged pasta or rice dishes. Prepackaged cereal with added sugar. Prepackaged snacks with added sugar. Meats and other proteins Beef. Pork. Lamb. Poultry with skin. Hot dogs. Berniece Salines. Dairy Ice cream. Sour cream. Whole milk. Fats and oils Butter. Canola oil. Vegetable oil. Beef fat (tallow). Lard. Beverages Juice. Sugar-sweetened soft drinks. Beer. Liquor and spirits. Sweets and desserts Cookies. Cakes. Pies. Candy. Seasonings and condiments Mayonnaise. Pre-made sauces and marinades. The items listed above may not be a complete list of foods and beverages you should limit. Contact a dietitian for more information. Summary The Mediterranean diet includes both food and lifestyle choices. Eat a variety of fresh fruits and vegetables, beans, nuts, seeds, and whole grains. Limit the amount of red meat and sweets that you eat. If recommended by your health care provider, drink red wine in moderation. This means 1 glass a day for nonpregnant women and 2 glasses a day for men. A glass of wine equals 5 oz (150 mL). This information is not intended to replace advice given to you by your health care provider. Make sure you discuss any questions you have with your health care provider. Document Revised: 04/25/2019 Document Reviewed: 02/20/2019 Elsevier Patient Education  [place] Eating Plan DASH stands for Dietary Approaches to Stop  Hypertension. The DASH eating plan is a healthy eating plan that has been shown to: Reduce high blood pressure (hypertension). Reduce your risk for type 2 diabetes, heart disease, and stroke. Help with weight loss. What are tips for following this plan? Reading food labels Check food labels for the amount of salt (sodium) per serving. Choose foods with less than 5 percent of the Daily Value of sodium. Generally, foods with less than 300 milligrams (mg) of sodium per serving fit into this eating plan. To find whole grains, look for the word "whole" as the first word in the ingredient list. Shopping Buy products labeled as "low-sodium" or "no salt added." Buy fresh foods. Avoid canned foods and pre-made or frozen meals. Cooking Avoid adding salt when cooking. Use salt-free seasonings or herbs instead of table salt or sea salt. Check with your health care provider or pharmacist before using salt substitutes. Do not fry foods. Cook foods using healthy methods such as baking, boiling, grilling, roasting, and broiling instead. Cook with heart-healthy oils, such as olive, canola, avocado, soybean, or sunflower oil. Meal planning  Eat a balanced diet that includes: 4 or more servings of fruits and 4 or more servings of vegetables each day. Try to fill one-half of your plate with fruits and vegetables. 6-8 servings of whole grains each day. Less than 6 oz (170 g)  of lean meat, poultry, or fish each day. A 3-oz (85-g) serving of meat is about the same size as a deck of cards. One egg equals 1 oz (28 g). 2-3 servings of low-fat dairy each day. One serving is 1 cup (237 mL). 1 serving of nuts, seeds, or beans 5 times each week. 2-3 servings of heart-healthy fats. Healthy fats called omega-3 fatty acids are found in foods such as walnuts, flaxseeds, fortified milks, and eggs. These fats are also found in cold-water fish, such as sardines, salmon, and mackerel. Limit how much you eat of: Canned or  prepackaged foods. Food that is high in trans fat, such as some fried foods. Food that is high in saturated fat, such as fatty meat. Desserts and other sweets, sugary drinks, and other foods with added sugar. Full-fat dairy products. Do not salt foods before eating. Do not eat more than 4 egg yolks a week. Try to eat at least 2 vegetarian meals a week. Eat more home-cooked food and less restaurant, buffet, and fast food. Lifestyle When eating at a restaurant, ask that your food be prepared with less salt or no salt, if possible. If you drink alcohol: Limit how much you use to: 0-1 drink a day for women who are not pregnant. 0-2 drinks a day for men. Be aware of how much alcohol is in your drink. In the [place] one drink equals one 12 oz bottle of beer (355 mL), one 5 oz glass of wine (148 mL), or one 1 oz glass of hard liquor (44 mL). General information Avoid eating more than 2,300 mg of salt a day. If you have hypertension, you may need to reduce your sodium intake to 1,500 mg a day. Work with your health care provider to maintain a healthy body weight or to lose weight. Ask what an ideal weight is for you. Get at least 30 minutes of exercise that causes your heart to beat faster (aerobic exercise) most days of the week. Activities may include walking, swimming, or biking. Work with your health care provider or dietitian to adjust your eating plan to your individual calorie needs. What foods should I eat? Fruits All fresh, dried, or frozen fruit. Canned fruit in natural juice (without added sugar). Vegetables Fresh or frozen vegetables (raw, steamed, roasted, or grilled). Low-sodium or reduced-sodium tomato and vegetable juice. Low-sodium or reduced-sodium tomato sauce and tomato paste. Low-sodium or reduced-sodium canned vegetables. Grains Whole-grain or whole-wheat bread. Whole-grain or whole-wheat pasta. Brown rice. [place]. Bulgur. Whole-grain and low-sodium cereals. Pita  bread. Low-fat, low-sodium crackers. Whole-wheat flour tortillas. Meats and other proteins Skinless chicken or [place]. Ground chicken or [place]. Pork with fat trimmed off. Fish and seafood. Egg whites. Dried beans, peas, or lentils. Unsalted nuts, nut butters, and seeds. Unsalted canned beans. Lean cuts of beef with fat trimmed off. Low-sodium, lean precooked or cured meat, such as sausages or meat loaves. Dairy Low-fat (1%) or fat-free (skim) milk. Reduced-fat, low-fat, or fat-free cheeses. Nonfat, low-sodium ricotta or cottage cheese. Low-fat or nonfat yogurt. Low-fat, low-sodium cheese. Fats and oils Soft margarine without trans fats. Vegetable oil. Reduced-fat, low-fat, or light mayonnaise and salad dressings (reduced-sodium). Canola, safflower, olive, avocado, soybean, and sunflower oils. Avocado. Seasonings and condiments Herbs. Spices. Seasoning mixes without salt. Other foods Unsalted popcorn and pretzels. Fat-free sweets. The items listed above may not be a complete list of foods and beverages you can eat. Contact a dietitian for more information. What foods should I avoid? Fruits Canned fruit  in a light or heavy syrup. Fried fruit. Fruit in cream or butter sauce. Vegetables Creamed or fried vegetables. Vegetables in a cheese sauce. Regular canned vegetables (not low-sodium or reduced-sodium). Regular canned tomato sauce and paste (not low-sodium or reduced-sodium). Regular tomato and vegetable juice (not low-sodium or reduced-sodium). Angie Fava. Olives. Grains Baked goods made with fat, such as croissants, muffins, or some breads. Dry pasta or rice meal packs. Meats and other proteins Fatty cuts of meat. Ribs. Fried meat. Berniece Salines. Bologna, salami, and other precooked or cured meats, such as sausages or meat loaves. Fat from the back of a pig (fatback). Bratwurst. Salted nuts and seeds. Canned beans with added salt. Canned or smoked fish. Whole eggs or egg yolks. Chicken or [place] with  skin. Dairy Whole or 2% milk, cream, and half-and-half. Whole or full-fat cream cheese. Whole-fat or sweetened yogurt. Full-fat cheese. Nondairy creamers. Whipped toppings. Processed cheese and cheese spreads. Fats and oils Butter. Stick margarine. Lard. Shortening. Ghee. Bacon fat. Tropical oils, such as coconut, palm kernel, or palm oil. Seasonings and condiments Onion salt, garlic salt, seasoned salt, table salt, and sea salt. Worcestershire sauce. Tartar sauce. Barbecue sauce. Teriyaki sauce. Soy sauce, including reduced-sodium. Steak sauce. Canned and packaged gravies. Fish sauce. Oyster sauce. Cocktail sauce. Store-bought horseradish. Ketchup. Mustard. Meat flavorings and tenderizers. Bouillon cubes. Hot sauces. Pre-made or packaged marinades. Pre-made or packaged taco seasonings. Relishes. Regular salad dressings. Other foods Salted popcorn and pretzels. The items listed above may not be a complete list of foods and beverages you should avoid. Contact a dietitian for more information. Where to find more information National Heart, Lung, and Blood Institute: https://wilson-eaton.com/ American Heart Association: www.heart.org Academy of Nutrition and Dietetics: www.eatright.[place]: www.kidney.org Summary The DASH eating plan is a healthy eating plan that has been shown to reduce high blood pressure (hypertension). It may also reduce your risk for type 2 diabetes, heart disease, and stroke. When on the DASH eating plan, aim to eat more fresh fruits and vegetables, whole grains, lean proteins, low-fat dairy, and heart-healthy fats. With the DASH eating plan, you should limit salt (sodium) intake to 2,300 mg a day. If you have hypertension, you may need to reduce your sodium intake to 1,500 mg a day. Work with your health care provider or dietitian to adjust your eating plan to your individual calorie needs. This information is not intended to replace advice given to you by your  health care provider. Make sure you discuss any questions you have with your health care provider. Document Revised: 02/21/2019 Document Reviewed: 02/21/2019 Elsevier Patient Education  [place]

## 2022-02-21 NOTE — Addendum Note (Signed)
Addended by: Hinton Dyer on: 02/21/2022 11:37 AM   Modules accepted: Orders

## 2022-02-22 ENCOUNTER — Encounter (HOSPITAL_BASED_OUTPATIENT_CLINIC_OR_DEPARTMENT_OTHER): Payer: Medicare Other | Admitting: General Surgery

## 2022-02-22 DIAGNOSIS — I110 Hypertensive heart disease with heart failure: Secondary | ICD-10-CM | POA: Diagnosis not present

## 2022-02-22 DIAGNOSIS — I890 Lymphedema, not elsewhere classified: Secondary | ICD-10-CM | POA: Diagnosis not present

## 2022-02-22 DIAGNOSIS — E11622 Type 2 diabetes mellitus with other skin ulcer: Secondary | ICD-10-CM | POA: Diagnosis not present

## 2022-02-22 DIAGNOSIS — L97512 Non-pressure chronic ulcer of other part of right foot with fat layer exposed: Secondary | ICD-10-CM | POA: Diagnosis not present

## 2022-02-22 DIAGNOSIS — L97822 Non-pressure chronic ulcer of other part of left lower leg with fat layer exposed: Secondary | ICD-10-CM | POA: Diagnosis not present

## 2022-02-22 DIAGNOSIS — L97812 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d: Secondary | ICD-10-CM | POA: Diagnosis not present

## 2022-02-22 DIAGNOSIS — E11621 Type 2 diabetes mellitus with foot ulcer: Secondary | ICD-10-CM | POA: Diagnosis not present

## 2022-02-23 NOTE — Progress Notes (Signed)
AROCHO, [name] [name] ([id number]) [id number] Page 1 of 14 Visit Report for [name] Arrival Information Details Patient Name: Date of Service: [name] [name] 3:45 PM Medical Record Number: [id number] Patient Account Number: [id number] Date of Birth/Sex: Treating RN: 03/01/1946 (76 y.o. [name] [name] Primary Care Brycelynn Stampley: [name] [name] Other Clinician: Referring [name] [name]: Treating Joenathan Sakuma/Extender: [name] in Treatment: 1 Visit Information History Since Last Visit Added or deleted any medications: No Patient Arrived: [name] [name] Any new allergies or adverse reactions: No Arrival Time: 15:38 Had [name] fall or experienced change in No Accompanied By: self activities of daily living that may affect Transfer Assistance: None risk of falls: Patient Identification Verified: Yes Signs or symptoms of abuse/neglect since last visito No Secondary Verification Process Completed: Yes Hospitalized since last visit: No Patient Requires Transmission-Based Precautions: No Implantable device outside of the clinic excluding No Patient Has Alerts: Yes cellular tissue based products placed in the center Patient Alerts: Patient on Blood Thinner since last visit: Has Dressing in Place as Prescribed: Yes Has Compression in Place as Prescribed: Yes Pain Present Now: No Electronic Signature(s) Signed: 02/22/2022 5:17:13 PM By: Adline Peals Entered By: Adline Peals on [name] 15:38:49 -------------------------------------------------------------------------------- Compression Therapy Details Patient Name: Date of Service: [place] [name] [name], [name] [name]. [name] 3:45 PM Medical Record Number: [id number] Patient Account Number: [id number] Date of Birth/Sex: Treating RN: [birth year] (76 y.o. [name] [name] Primary Care Taneeka Curtner: [name] [name] Other Clinician: Referring [name] [name]: Treating [name] [name]/Extender: [name] in Treatment: 1 Compression Therapy Performed for Wound Assessment: Wound #4 Left,Anterior Lower Leg Performed By: Clinician Adline Peals, RN Compression Type: Three Layer Post Procedure Diagnosis Same as Pre-procedure Electronic Signature(s) Signed: 02/22/2022 5:17:13 PM By: Adline Peals Entered By: Adline Peals on [name] 17:14:25 Kobrin, [name] [name] ([id number]) [id number] Page 2 of 14 -------------------------------------------------------------------------------- Compression Therapy Details Patient Name: Date of Service: [name], [name] [name]. [name] 3:45 PM Medical Record Number: [id number] Patient Account Number: [id number] Date of Birth/Sex: Treating RN: [birth year] (76 y.o. [name] [name] Primary Care Skyanne Welle: [name] [name] Other Clinician: Referring [name] [name]: Treating [name] [name]/Extender: [name] in Treatment: 1 Compression Therapy Performed for Wound Assessment: Wound #2 Right,Anterior Lower Leg Performed By: Clinician Adline Peals, RN Compression Type: Three Layer Post Procedure Diagnosis Same as Pre-procedure Electronic Signature(s) Signed: 02/22/2022 5:17:13 PM By: Adline Peals Entered By: Adline Peals on [name] 17:14:25 -------------------------------------------------------------------------------- Encounter Discharge Information Details Patient Name: Date of Service: [name], [name] [name]. [name] 3:45 PM Medical Record Number: [id number] Patient Account Number: [id number] Date of Birth/Sex: Treating RN: [birth year] (76 y.o. [name] [name] Primary Care Lamar Naef: [name] [name] Other Clinician: Referring Karlie Aung: Treating [name] [name]/Extender: [name] in Treatment: 1 Encounter Discharge Information Items Post Procedure Vitals Discharge Condition: Stable Temperature (F): 98.4 Ambulatory Status:  Cane Pulse (bpm): 66 Discharge Destination: Home Respiratory Rate (breaths/min): 18 Transportation: Private Auto Blood Pressure (mmHg): 166/61 Accompanied By: self Schedule Follow-up Appointment: Yes Clinical Summary of Care: Patient Declined Electronic Signature(s) Signed: 02/22/2022 5:17:13 PM By: Adline Peals Entered By: Adline Peals on [name] 16:35:14 -------------------------------------------------------------------------------- Lower Extremity Assessment Details Patient Name: Date of Service: [name], [name]. [name] 3:45 PM Medical Record Number: [id number] Patient Account Number: [id number] Date of Birth/Sex: Treating RN: 06/07/1945 (76 y.o. [name] [name] Primary Care Deakon Frix: [name] [name] Other Clinician: Referring Aliscia Clayton: Treating Xachary Hambly/Extender: [name] in Treatment: 1 Edema Assessment Assessed: [name] [name]: No] [name] [name]: No] [Left: Edema] [Right: :] Calf  [name], [name] [name] ([id number]) [id number] Page 3 of 14 Left: Right: Point of Measurement: From Medial Instep 54 cm 60.3 cm Ankle Left: Right: Point of Measurement: From Medial Instep 31.5 cm 33 cm Vascular Assessment Pulses: Dorsalis Pedis Palpable: [Left:Yes] [Right:Yes] Electronic Signature(s) Signed: 02/22/2022 5:17:13 PM By: Adline Peals Entered By: Adline Peals on [name] 15:50:08 -------------------------------------------------------------------------------- Multi Wound Chart Details Patient Name: Date of Service: [name], [name] [name]. [name] 3:45 PM Medical Record Number: [id number] Patient Account Number: [id number] Date of Birth/Sex: Treating RN: 1945/11/14 (76 y.o. 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 Etiology: Extremity Anemia, Lymphedema, Sleep Apnea, Comorbid History: Arrhythmia, Congestive Heart Failure,Arrhythmia, Congestive Heart  Failure, Arrhythmia, Congestive Heart Failure, Hypertension, Type II Diabetes, Neuropathy 01/31/2022 Date Acquired: 1 Weeks of Treatment: Open Wound Status: No Wound Recurrence: 2.4x4x0.1 Measurements L x W x D (cm) 7.54 [name] (cm) : rea 0.754 Volume (cm)  : 33.10% % Reduction in Area: 33.10% % Reduction in Volume: Grade 1 Classification: Medium Exudate [name] mount: Serosanguineous Exudate Type: red, brown Exudate Color: Distinct, outline attached Wound Margin: Small (1-33%) Granulation Amount: Red Granulation  Quality: Large (67-100%) Necrotic Amount: Adherent Slough Necrotic Tissue: Fat Layer (Subcutaneous Tissue): Yes Fat Layer (Subcutaneous Tissue): Yes Fascia: No Exposed Structures: Fascia: No Tendon: No Muscle: No Joint: No Bone: No] [2:No Photos Right,  Anterior Lower Leg Blister Lymphedema Anemia, Lymphedema, Sleep Apnea, Anemia, Lymphedema, Sleep Apnea, Hypertension, Type II Diabetes, Neuropathy 02/13/2021 1 Open No 1x1.5x0.1 1.178 0.118 64.70% 64.70% Full Thickness Without Exposed Support Structures  Medium Serosanguineous red, brown Distinct, outline attached Medium (34-66%) Red, Pink Medium (34-66%) Eschar, Adherent Slough Fascia: No Tendon: No Muscle: No Joint: No Bone: No] [3:No Photos Right, Lateral Lower Leg Blister Lymphedema Hypertension,  Type II Diabetes, Neuropathy 02/13/2021 1 Open No 0x0x0 0 0 100.00% 100.00% Full Thickness Without Exposed Support Structures None Present N/[name] N/[name] Distinct, outline attached None Present (0%) N/[name] None Present (0%) N/[name] Fat Layer (Subcutaneous Tissue): No  Tendon: No Muscle: No Joint: No Bone:  No] Lafever, [name] [name] ([id number]) [1:Small (1-33%) Epithelialization: Debridement - Selective/Open Wound Debridement - Selective/Open Wound N/[name] Debridement: 15:58 Pre-procedure Verification/Time Out Taken: Lidocaine 4% Topical Solution Pain Control: Slough Tissue Debrided: Non-Viable  Tissue Level: 9.6 Debridement [name] (sq cm): rea Curette Instrument: Minimum Bleeding: Pressure Hemostasis [name] chieved: Procedure was tolerated well Debridement Treatment Response: 2.4x4x0.1 Post Debridement Measurements L x W x D (cm) 0.754 Post Debridement  Volume: (cm) No Abnormalities Noted Periwound Skin Texture: Maceration: Yes Periwound Skin Moisture: Dry/Scaly: No Erythema: Yes Periwound Skin Color: No Abnormality Temperature: Debridement Procedures Performed:] [2:Small (1-33%) 15:58 Lidocaine 4%  Topical Solution Necrotic/Eschar, Slough Non-Viable Tissue 1.5 Curette Minimum Pressure Procedure was tolerated well 1x1.5x0.1 0.118 No Abnormalities Noted Dry/Scaly: Yes Hemosiderin Staining: Yes No Abnormality Debridement]  [id number] Page 4 of 14 Large (67-100%) N/[name] N/[name] N/[name] N/[name] N/[name] N/[name] N/[name] N/[name] N/[name] N/[name] N/[name] No Abnormalities Noted Dry/Scaly: Yes Hemosiderin Staining: Yes No Abnormality N/[name]] Wound Number: 4 N/[name] N/[name] Photos: No Photos N/[name] N/[name] Left, Anterior Lower Leg N/[name] N/[name] Wound Location: Blister N/[name] N/[name] Wounding Event: Lymphedema N/[name] N/[name] Primary Etiology: Anemia, Lymphedema, Sleep Apnea, N/[name] N/[name] Comorbid History: Arrhythmia, Congestive Heart Failure, Hypertension, Type II  Diabetes, Neuropathy 02/13/2021 N/[name] N/[name] Date Acquired: 1 N/[name] N/[name] Weeks of Treatment: Open N/[name] N/[name] Wound Status: No N/[name] N/[name] Wound Recurrence: 7.4x2.6x0.1 N/[name] N/[name] Measurements L x W x D (cm) 15.111 N/[name] N/[name] [name] (cm) : rea 1.511 N/[name] N/[name] Volume (cm) : -83.20% N/[name] N/[name] % Reduction in [name] rea: -83.20% N/[name] N/[name] % Reduction in Volume: Full Thickness Without Exposed N/[name] N/[name] Classification: Support  Structures Medium N/[name] N/[name] Exudate [name] mount: Serosanguineous N/[name] N/[name] Exudate Type: red, brown N/[name] N/[name] Exudate Color: Distinct, outline attached N/[name] N/[name] Wound Margin: Medium (34-66%) N/[name] N/[name] Granulation [name] mount: Red, Pink N/[name] N/[name] Granulation Quality: Medium (34-66%) N/[name] N/[name] Necrotic [name] mount: Adherent Slough N/[name] N/[name] Necrotic Tissue: Fat Layer (Subcutaneous Tissue): Yes N/[name] N/[name] Exposed Structures: Fascia: No Tendon: No Muscle: No Joint: No Bone: No None N/[name] N/[name] Epithelialization: Debridement - Selective/Open Wound N/[name] N/[name] Debridement: Pre-procedure Verification/Time Out 15:58 N/[name] N/[name] Taken: Lidocaine 4% Topical Solution N/[name] N/[name] Pain Control: Slough N/[name] N/[name] Tissue Debrided: Non-Viable Tissue N/[name] N/[name] Level: 19.24 N/[name] N/[name] Debridement [name] (sq cm): rea Curette N/[name] N/[name] Instrument: Minimum N/[name] N/[name] Bleeding: Pressure N/[name] N/[name] Hemostasis [name] chieved: Procedure was tolerated well N/[name] N/[name] Debridement Treatment Response: 7.4x2.6x0.1 N/[name] N/[name] Post Debridement Measurements L x W x D (cm) 1.511 N/[name] N/[name] Post Debridement Volume: (cm) No Abnormalities Noted N/[name] N/[name] Periwound Skin Texture: Dry/Scaly: Yes N/[name] N/[name] Periwound Skin Moisture: Erythema: Yes N/[name] N/[name] Periwound Skin Color: Hemosiderin Staining: Yes No Abnormality N/[name] N/[name] Temperature: Debridement N/[name] N/[name] Procedures Performed: Treatment Notes Wound #1 (Foot) Wound Laterality: Dorsal, Right Cleanser Soap and Water Discharge Instruction: May shower and wash wound with dial antibacterial soap and water prior to dressing change. BORCHARDT, Trevis [name] ([id number]) [id number] Page 5 of 14 Wound Cleanser Discharge Instruction: Cleanse the wound with wound cleanser prior to applying [name] clean dressing using gauze sponges, not tissue or cotton balls. Peri-Wound Care Sween Lotion (Moisturizing lotion) Discharge Instruction: Apply moisturizing lotion as directed Topical Primary Dressing Hydrofera Blue  Ready Foam, 2.5 x2.5 in Discharge Instruction: Apply to wound bed as instructed Secondary Dressing Woven Gauze Sponge, Non-Sterile 4x4 in Discharge Instruction: Apply over primary dressing as directed. Zetuvit Plus 4x8 in Discharge Instruction: Apply over primary dressing as directed. Secured With Transpore Surgical Tape, 2x10 (in/yd) Discharge Instruction: Secure dressing with tape as directed. Compression Wrap Compression Stockings Add-Ons Wound #2 (Lower Leg) Wound Laterality: Right, Anterior Cleanser Soap and Water Discharge Instruction: May shower and wash wound with dial antibacterial soap and water prior to dressing change. Wound Cleanser Discharge Instruction: Cleanse the wound with wound cleanser prior to applying [name] clean dressing using gauze sponges, not tissue or cotton balls. Peri-Wound Care Sween Lotion (Moisturizing lotion) Discharge Instruction: Apply moisturizing lotion as directed Topical Primary Dressing Hydrofera Blue Ready Foam, 2.5 x2.5 in Discharge Instruction: Apply to wound bed as instructed Secondary Dressing ABD Pad, 5x9 Discharge Instruction: Apply over primary dressing as directed. Woven Gauze Sponge, Non-Sterile 4x4 in Discharge Instruction: Apply over primary dressing as directed. Secured With Transpore Surgical Tape, 2x10 (in/yd) Discharge Instruction: Secure dressing with tape as directed. Compression Wrap Compression Stockings Jobst Farrow Wrap 4000 Quantity: 1 Right Leg Compression Amount: 30-40 mmHg Discharge Instruction: Apply Francia Greaves daily as instructed. Apply first thing in the morning, remove at night before bed. Add-Ons Wound #3 (Lower Leg) Wound Laterality: Right, Lateral Cleanser Peri-Wound Care Topical Berringer, Elih [name] ([id number]) [id number] Page 6 of 14 Primary Dressing Secondary Dressing Secured With Compression Wrap Compression Stockings Add-Ons Wound #4 (  Lower Leg) Wound Laterality:  Left, Anterior Cleanser Soap and Water Discharge Instruction: May shower and wash wound with dial antibacterial soap and water prior to dressing change. Wound Cleanser Discharge Instruction: Cleanse the wound with wound cleanser prior to applying [name] clean dressing using gauze sponges, not tissue or cotton balls. Peri-Wound Care Sween Lotion (Moisturizing lotion) Discharge Instruction: Apply moisturizing lotion as directed Topical Primary Dressing Hydrofera Blue Ready Foam, 2.5 x2.5 in Discharge Instruction: Apply to wound bed as instructed Secondary Dressing ABD Pad, 5x9 Discharge Instruction: Apply over primary dressing as directed. Woven Gauze Sponge, Non-Sterile 4x4 in Discharge Instruction: Apply over primary dressing as directed. Secured With Transpore Surgical Tape, 2x10 (in/yd) Discharge Instruction: Secure dressing with tape as directed. Compression Wrap ThreePress (3 layer compression wrap) Discharge Instruction: Apply three layer compression as directed. Compression Stockings Jobst Farrow Wrap 4000 Quantity: 1 Left Leg Compression Amount: 30-40 mmHg Discharge Instruction: Apply Francia Greaves daily as instructed. Apply first thing in the morning, remove at night before bed. Add-Ons Electronic Signature(s) Signed: 02/22/2022 4:53:39 PM By: [name] MD FACS Entered By: [name] on [name] 16:53:39 -------------------------------------------------------------------------------- Multi-Disciplinary Care Plan Details Patient Name: Date of Service: [place], [name] [name]. [name] 3:45 PM Medical Record Number: [id number] Patient Account Number: [id number] Date of Birth/Sex: Treating RN: 05-30-1945 (76 y.o. [name] [name] Primary Care Alexine Pilant: [name] [name] Other Clinician: Referring Sojourner Behringer: Treating Anguel Delapena/Extender: [name] in Treatment: [place], Elbert ([id number]) [id number] Page 7  of 14 Active Inactive Necrotic Tissue Nursing Diagnoses: Impaired tissue integrity related to necrotic/devitalized tissue Knowledge deficit related to management of necrotic/devitalized tissue Goals: Necrotic/devitalized tissue will be minimized in the wound bed Date Initiated: 02/13/2022 Target Resolution Date: 04/07/2022 Goal Status: Active Patient/caregiver will verbalize understanding of reason and process for debridement of necrotic tissue Date Initiated: 02/13/2022 Target Resolution Date: 04/07/2022 Goal Status: Active Interventions: Assess patient pain level pre-, during and post procedure and prior to discharge Provide education on necrotic tissue and debridement process Treatment Activities: Apply topical anesthetic as ordered : 02/13/2022 Notes: Wound/Skin Impairment Nursing Diagnoses: Impaired tissue integrity Knowledge deficit related to ulceration/compromised skin integrity Goals: Patient/caregiver will verbalize understanding of skin care regimen Date Initiated: 02/13/2022 Target Resolution Date: 04/07/2022 Goal Status: Active Interventions: Assess ulceration(s) every visit Treatment Activities: Skin care regimen initiated : 02/13/2022 Topical wound management initiated : 02/13/2022 Notes: Electronic Signature(s) Signed: 02/22/2022 5:17:13 PM By: Adline Peals Entered By: Adline Peals on [name] 16:02:37 -------------------------------------------------------------------------------- Pain Assessment Details Patient Name: Date of Service: [place], [place] [name]. [name] 3:45 PM Medical Record Number: [id number] Patient Account Number: [id number] Date of Birth/Sex: Treating RN: 05-05-1945 (76 y.o. [name] [name] Primary Care Mikena Masoner: [name] [name] Other Clinician: Referring Simonne Boulos: Treating Carmilla Granville/Extender: [name] in Treatment: 1 Active Problems Location of Pain Severity and Description of Pain Patient  Has Paino No Site Locations Rate the pain. MONCRIEF, [name] [name] ([id number]) [id number] Page 8 of 14 Rate the pain. Current Pain Level: 0 Pain Management and Medication Current Pain Management: Electronic Signature(s) Signed: 02/22/2022 5:17:13 PM By: Adline Peals Entered By: Adline Peals on [name] 15:42:41 -------------------------------------------------------------------------------- Patient/Caregiver Education Details Patient Name: Date of Service: [place] [name] [name], [place] 11/22/2023andnbsp3:45 PM Medical Record Number: [id number] Patient Account Number: [id number] Date of Birth/Gender: Treating RN: 1946-02-13 (76 y.o. [name] [name] Primary Care Physician: [name] [name] Other Clinician: Referring Physician: Treating Physician/Extender: [name] in Treatment: 1 Education Assessment Education Provided To: Patient Education Topics Provided  Wound/Skin Impairment: Methods: Explain/Verbal Responses: Reinforcements needed, State content correctly Electronic Signature(s) Signed: 02/22/2022 5:17:13 PM By: Adline Peals Entered By: Adline Peals on [name] 16:02:48 -------------------------------------------------------------------------------- Wound Assessment Details Patient Name: Date of Service: , [name] [name]. [name] 3:45 PM Medical Record Number: [id number] Patient Account Number: [id number] Date of Birth/Sex: Treating RN: March 13, 1946 (76 y.o. [name] [name] Primary Care Deatra Mcmahen: [name] [name] Other Clinician: Referring Merilyn Pagan: Treating Delois Silvester/Extender: Eyvonne Mechanic, [place] ([id number]) [id number] Page 9 of 14 Weeks in Treatment: 1 Wound Status Wound Number: 1 Primary Diabetic Wound/Ulcer of the Lower Extremity Etiology: Wound Location: Right, Dorsal Foot Wound Open Wounding Event: Blister Status: Date  Acquired: 01/31/2022 Comorbid Anemia, Lymphedema, Sleep Apnea, Arrhythmia, Congestive Heart Weeks Of Treatment: 1 History: Failure, Hypertension, Type II Diabetes, Neuropathy Clustered Wound: No Wound Measurements Length: (cm) 2.4 Width: (cm) 4 Depth: (cm) 0.1 Area: (cm) 7.54 Volume: (cm) 0.754 % Reduction in Area: 33.1% % Reduction in Volume: 33.1% Epithelialization: Small (1-33%) Tunneling: No Undermining: No Wound Description Classification: Grade 1 Wound Margin: Distinct, outline attached Exudate Amount: Medium Exudate Type: Serosanguineous Exudate Color: red, brown Foul Odor After Cleansing: No Slough/Fibrino Yes Wound Bed Granulation Amount: Small (1-33%) Exposed Structure Granulation Quality: Red Fascia Exposed: No Necrotic Amount: Large (67-100%) Fat Layer (Subcutaneous Tissue) Exposed: Yes Necrotic Quality: Adherent Slough Tendon Exposed: No Muscle Exposed: No Joint Exposed: No Bone Exposed: No Periwound Skin Texture Texture Color No Abnormalities Noted: Yes No Abnormalities Noted: No Erythema: Yes Moisture No Abnormalities Noted: No Temperature / Pain Dry / Scaly: No Temperature: No Abnormality Maceration: Yes Treatment Notes Wound #1 (Foot) Wound Laterality: Dorsal, Right Cleanser Soap and Water Discharge Instruction: May shower and wash wound with dial antibacterial soap and water prior to dressing change. Wound Cleanser Discharge Instruction: Cleanse the wound with wound cleanser prior to applying [name] clean dressing using gauze sponges, not tissue or cotton balls. Peri-Wound Care Sween Lotion (Moisturizing lotion) Discharge Instruction: Apply moisturizing lotion as directed Topical Primary Dressing Hydrofera Blue Ready Foam, 2.5 x2.5 in Discharge Instruction: Apply to wound bed as instructed Secondary Dressing Woven Gauze Sponge, Non-Sterile 4x4 in Discharge Instruction: Apply over primary dressing as directed. Zetuvit Plus 4x8 in Discharge  Instruction: Apply over primary dressing as directed. Secured With Transpore Surgical Tape, 2x10 (in/yd) Discharge Instruction: Secure dressing with tape as directed. Compression Wrap Croker, [name] [name] ([id number]) [id number] Page 10 of 14 Compression Stockings Add-Ons Electronic Signature(s) Signed: 02/22/2022 5:17:13 PM By: Adline Peals Entered By: Adline Peals on [name] 15:52:28 -------------------------------------------------------------------------------- Wound Assessment Details Patient Name: Date of Service: [place], [name] [name]. [name] 3:45 PM Medical Record Number: [id number] Patient Account Number: [id number] Date of Birth/Sex: Treating RN: 03-17-1946 (76 y.o. [name] [name] Primary Care Natavia Sublette: [name] [name] Other Clinician: Referring Aarish Rockers: Treating Evanthia Maund/Extender: [name] in Treatment: 1 Wound Status Wound Number: 2 Primary Lymphedema Etiology: Wound Location: Right, Anterior Lower Leg Wound Open Wounding Event: Blister Status: Date Acquired: 02/13/2021 Comorbid Anemia, Lymphedema, Sleep Apnea, Arrhythmia, Congestive Heart Weeks Of Treatment: 1 History: Failure, Hypertension, Type II Diabetes, Neuropathy Clustered Wound: No Wound Measurements Length: (cm) 1 Width: (cm) 1.5 Depth: (cm) 0.1 Area: (cm) 1.178 Volume: (cm) 0.118 % Reduction in Area: 64.7% % Reduction in Volume: 64.7% Epithelialization: Small (1-33%) Tunneling: No Undermining: No Wound Description Classification: Full Thickness Without Exposed Support Structures Wound Margin: Distinct, outline attached Exudate Amount: Medium Exudate Type: Serosanguineous Exudate Color: red, brown Foul Odor After Cleansing: No Slough/Fibrino Yes Wound Bed Granulation  Amount: Medium (34-66%) Exposed Structure Granulation Quality: Red, Pink Fascia Exposed: No Necrotic Amount: Medium (34-66%) Fat Layer  (Subcutaneous Tissue) Exposed: Yes Necrotic Quality: Eschar, Adherent Slough Tendon Exposed: No Muscle Exposed: No Joint Exposed: No Bone Exposed: No Periwound Skin Texture Texture Color No Abnormalities Noted: Yes No Abnormalities Noted: No Hemosiderin Staining: Yes Moisture No Abnormalities Noted: No Temperature / Pain Dry / Scaly: Yes Temperature: No Abnormality Treatment Notes Wound #2 (Lower Leg) Wound Laterality: Right, Anterior Cleanser Soap and Water Discharge Instruction: May shower and wash wound with dial antibacterial soap and water prior to dressing change. Wound Cleanser Discharge Instruction: Cleanse the wound with wound cleanser prior to applying [name] clean dressing using gauze sponges, not tissue or cotton balls. WOHL, Jameal [name] ([id number]) [id number] Page 11 of 14 Peri-Wound Care Sween Lotion (Moisturizing lotion) Discharge Instruction: Apply moisturizing lotion as directed Topical Primary Dressing Hydrofera Blue Ready Foam, 2.5 x2.5 in Discharge Instruction: Apply to wound bed as instructed Secondary Dressing ABD Pad, 5x9 Discharge Instruction: Apply over primary dressing as directed. Woven Gauze Sponge, Non-Sterile 4x4 in Discharge Instruction: Apply over primary dressing as directed. Secured With Transpore Surgical Tape, 2x10 (in/yd) Discharge Instruction: Secure dressing with tape as directed. Compression Wrap Compression Stockings Jobst Farrow Wrap 4000 Quantity: 1 Right Leg Compression Amount: 30-40 mmHg Discharge Instruction: Apply Francia Greaves daily as instructed. Apply first thing in the morning, remove at night before bed. Add-Ons Electronic Signature(s) Signed: 02/22/2022 5:17:13 PM By: Adline Peals Entered By: Adline Peals on [name] 15:52:45 -------------------------------------------------------------------------------- Wound Assessment Details Patient Name: Date of Service: [name], [name]  [name]. [name] 3:45 PM Medical Record Number: [id number] Patient Account Number: [id number] Date of Birth/Sex: Treating RN: 1945/12/25 (76 y.o. [name] [name] Primary Care Niko Penson: [name] [name] Other Clinician: Referring Darius Lundberg: Treating Elizabth Palka/Extender: [name] in Treatment: 1 Wound Status Wound Number: 3 Primary Lymphedema Etiology: Wound Location: Right, Lateral Lower Leg Wound Open Wounding Event: Blister Status: Date Acquired: 02/13/2021 Comorbid Anemia, Lymphedema, Sleep Apnea, Arrhythmia, Congestive Heart Weeks Of Treatment: 1 History: Failure, Hypertension, Type II Diabetes, Neuropathy Clustered Wound: No Wound Measurements Length: (cm) Width: (cm) Depth: (cm) Area: (cm) Volume: (cm) 0 % Reduction in Area: 100% 0 % Reduction in Volume: 100% 0 Epithelialization: Large (67-100%) 0 Tunneling: No 0 Undermining: No Wound Description Classification: Full Thickness Without Exposed Support Structures Wound Margin: Distinct, outline attached Exudate Amount: None Present Foul Odor After Cleansing: No Slough/Fibrino No Wound Bed Granulation Amount: None Present (0%) Exposed Structure Necrotic Amount: None Present (0%) Fascia Exposed: No Pester, Kinsley [name] ([id number]) [id number] Page 12 of 14 Fat Layer (Subcutaneous Tissue) Exposed: No Tendon Exposed: No Muscle Exposed: No Joint Exposed: No Bone Exposed: No Periwound Skin Texture Texture Color No Abnormalities Noted: Yes No Abnormalities Noted: No Hemosiderin Staining: Yes Moisture No Abnormalities Noted: No Temperature / Pain Dry / Scaly: Yes Temperature: No Abnormality Electronic Signature(s) Signed: 02/22/2022 5:17:13 PM By: Adline Peals Entered By: Adline Peals on [name] 15:53:01 -------------------------------------------------------------------------------- Wound Assessment Details Patient Name: Date of Service: [name], [name] [name] [name] 3:45 PM Medical Record Number: [id number] Patient Account Number: [id number] Date of Birth/Sex: Treating RN: 22-Jan-1946 (76 y.o. [name] [name] Primary Care Kenitha Glendinning: [name] [name] Other Clinician: Referring Seneca Gadbois: Treating Emelyn Roen/Extender: [name] in Treatment: 1 Wound Status Wound Number: 4 Primary Lymphedema Etiology: Wound Location: Left, Anterior Lower Leg Wound Open Wounding Event: Blister Status: Date Acquired: 02/13/2021 Comorbid Anemia, Lymphedema, Sleep Apnea, Arrhythmia, Congestive Heart Weeks Of  Treatment: 1 History: Failure, Hypertension, Type II Diabetes, Neuropathy Clustered Wound: No Wound Measurements Length: (cm) 7.4 Width: (cm) 2.6 Depth: (cm) 0.1 Area: (cm) 15.111 Volume: (cm) 1.511 % Reduction in Area: -83.2% % Reduction in Volume: -83.2% Epithelialization: None Tunneling: No Undermining: No Wound Description Classification: Full Thickness Without Exposed Support Structures Wound Margin: Distinct, outline attached Exudate Amount: Medium Exudate Type: Serosanguineous Exudate Color: red, brown Foul Odor After Cleansing: No Slough/Fibrino Yes Wound Bed Granulation Amount: Medium (34-66%) Exposed Structure Granulation Quality: Red, Pink Fascia Exposed: No Necrotic Amount: Medium (34-66%) Fat Layer (Subcutaneous Tissue) Exposed: Yes Necrotic Quality: Adherent Slough Tendon Exposed: No Muscle Exposed: No Joint Exposed: No Bone Exposed: No Periwound Skin Texture Texture Color No Abnormalities Noted: Yes No Abnormalities Noted: No Erythema: Yes Moisture Hemosiderin Staining: Yes No Abnormalities Noted: No Dry / Scaly: Yes Temperature / Pain Temperature: No Abnormality Mahadeo, [name] [name] ([id number]) [id number] Page 13 of 14 Treatment Notes Wound #4 (Lower Leg) Wound Laterality: Left, Anterior Cleanser Soap and Water Discharge Instruction: May  shower and wash wound with dial antibacterial soap and water prior to dressing change. Wound Cleanser Discharge Instruction: Cleanse the wound with wound cleanser prior to applying [name] clean dressing using gauze sponges, not tissue or cotton balls. Peri-Wound Care Sween Lotion (Moisturizing lotion) Discharge Instruction: Apply moisturizing lotion as directed Topical Primary Dressing Hydrofera Blue Ready Foam, 2.5 x2.5 in Discharge Instruction: Apply to wound bed as instructed Secondary Dressing ABD Pad, 5x9 Discharge Instruction: Apply over primary dressing as directed. Woven Gauze Sponge, Non-Sterile 4x4 in Discharge Instruction: Apply over primary dressing as directed. Secured With Transpore Surgical Tape, 2x10 (in/yd) Discharge Instruction: Secure dressing with tape as directed. Compression Wrap ThreePress (3 layer compression wrap) Discharge Instruction: Apply three layer compression as directed. Compression Stockings Jobst Farrow Wrap 4000 Quantity: 1 Left Leg Compression Amount: 30-40 mmHg Discharge Instruction: Apply Francia Greaves daily as instructed. Apply first thing in the morning, remove at night before bed. Add-Ons Electronic Signature(s) Signed: 02/22/2022 5:17:13 PM By: Adline Peals Entered By: Adline Peals on [name] 15:53:18 -------------------------------------------------------------------------------- Vitals Details Patient Name: Date of Service: Warren, Hanan [name]. [name] 3:45 PM Medical Record Number: [id number] Patient Account Number: [id number] Date of Birth/Sex: Treating RN: 1945/09/04 (76 y.o. [name] [name] Primary Care Izick Gasbarro: [name] [name] Other Clinician: Referring Blanka Rockholt: Treating Malya Cirillo/Extender: [name] in Treatment: 1 Vital Signs Time Taken: 15:42 Temperature (F): 98.4 Height (in): 72 Pulse (bpm): 66 Weight (lbs): 375 Respiratory Rate (breaths/min): 18 Body Mass Index (BMI):  50.9 Blood Pressure (mmHg): 166/61 Reference Range: 80 - 120 mg / dl Electronic Signature(s) Signed: 02/22/2022 5:17:13 PM By: Otilio Jefferson, Gregori [name] ([id number]) PM By: [employer]pdf Page 14 of 14 Signed: 02/22/2022 5:17:13 aylor Entered By: Adline Peals on [name] 15:42:32

## 2022-02-23 NOTE — Progress Notes (Signed)
[name] [name] ([id number]) [id number] Page 1 of 13 Visit Report for 02/22/2022 Chief Complaint Document Details Patient Name: Date of Service: [name] [name], [name] [name]. 02/22/2022 3:45 PM Medical Record Number: [id number] Patient Account Number: [id number] Date of Birth/Sex: Treating RN: 1946-03-16 (76 y.o. M) Primary Care Provider: [name] Other Clinician: Referring Provider: Treating Provider/Extender: [name] in Treatment: 1 Information Obtained from: Patient Chief Complaint Patients presents for treatment of open ulcers secondary to lymphedema and diabetes Electronic Signature(s) Signed: 02/22/2022 4:53:49 PM By: [name] MD FACS Entered By: [name] on 02/22/2022 16:53:49 -------------------------------------------------------------------------------- Debridement Details Patient Name: Date of Service: [name] [name], [name] [name]. 02/22/2022 3:45 PM Medical Record Number: [id number] Patient Account Number: [id number] Date of Birth/Sex: Treating RN: 09-13-1945 (76 y.o. [name] Primary Care Provider: [name] Other Clinician: Referring Provider: Treating Provider/Extender: [name] in Treatment: 1 Debridement Performed for Assessment: Wound #2 Right,Anterior Lower Leg Performed By: Physician [name], MD Debridement Type: Debridement Level of Consciousness (Pre-procedure): Awake and Alert Pre-procedure Verification/Time Out Yes - 15:58 Taken: Start Time: 15:58 Pain Control: Lidocaine 4% T opical Solution T Area Debrided (L x W): otal 1 (cm) x 1.5 (cm) = 1.5 (cm) Tissue and other material debrided: Non-Viable, Eschar, Slough, Slough Level: Non-Viable Tissue Debridement Description: Selective/Open Wound Instrument: Curette Bleeding: Minimum Hemostasis Achieved: Pressure Response to Treatment: Procedure was tolerated well Level of Consciousness  (Post- Awake and Alert procedure): Post Debridement Measurements of Total Wound Length: (cm) 1 Width: (cm) 1.5 Depth: (cm) 0.1 Volume: (cm) 0.118 Character of Wound/Ulcer Post Debridement: Improved Post Procedure Diagnosis Same as Pre-procedure [name] [name] ([id number]) [id number] Page 2 of 13 Notes scribed for Dr. Celine Ahr by Adline Peals, RN Electronic Signature(s) Signed: 02/22/2022 5:15:30 PM By: [name] MD FACS Signed: 02/22/2022 5:17:13 PM By: Adline Peals Entered By: Adline Peals on 02/22/2022 16:00:00 -------------------------------------------------------------------------------- Debridement Details Patient Name: Date of Service: [name] [name], [name] [name]. 02/22/2022 3:45 PM Medical Record Number: [id number] Patient Account Number: [id number] Date of Birth/Sex: Treating RN: 03-Feb-1946 (76 y.o. [name] Primary Care Provider: [name] Other Clinician: Referring Provider: Treating Provider/Extender: [name] in Treatment: 1 Debridement Performed for Assessment: Wound #1 Right,Dorsal Foot Performed By: Physician [name], MD Debridement Type: Debridement Severity of Tissue Pre Debridement: Fat layer exposed Level of Consciousness (Pre-procedure): Awake and Alert Pre-procedure Verification/Time Out Yes - 15:58 Taken: Start Time: 15:58 Pain Control: Lidocaine 4% T opical Solution T Area Debrided (L x W): otal 2.4 (cm) x 4 (cm) = 9.6 (cm) Tissue and other material debrided: Non-Viable, Slough, Slough Level: Non-Viable Tissue Debridement Description: Selective/Open Wound Instrument: Curette Bleeding: Minimum Hemostasis Achieved: Pressure Response to Treatment: Procedure was tolerated well Level of Consciousness (Post- Awake and Alert procedure): Post Debridement Measurements of Total Wound Length: (cm) 2.4 Width: (cm) 4 Depth: (cm) 0.1 Volume: (cm)  0.754 Character of Wound/Ulcer Post Debridement: Improved Severity of Tissue Post Debridement: Fat layer exposed Post Procedure Diagnosis Same as Pre-procedure Notes scribed for Dr. Celine Ahr by Adline Peals, RN Electronic Signature(s) Signed: 02/22/2022 5:15:30 PM By: [name] MD FACS Signed: 02/22/2022 5:17:13 PM By: Adline Peals Entered By: Adline Peals on 02/22/2022 16:02:00 -------------------------------------------------------------------------------- Debridement Details Patient Name: Date of Service: [name] [name], [name] 02/22/2022 3:45 PM Medical Record Number: [id number] Patient Account Number: [id number] [facility], [name] [name] ([id number]) [id number] Page 3 of 13 Date of Birth/Sex: Treating RN: 04-Oct-1945 (76 y.o. [name] Primary Care Provider:  [name] Other Clinician: Referring Provider: Treating Provider/Extender: [name] in Treatment: 1 Debridement Performed for Assessment: Wound #4 Left,Anterior Lower Leg Performed By: Physician [name], MD Debridement Type: Debridement Level of Consciousness (Pre-procedure): Awake and Alert Pre-procedure Verification/Time Out Yes - 15:58 Taken: Start Time: 15:58 Pain Control: Lidocaine 4% T opical Solution T Area Debrided (L x W): otal 7.4 (cm) x 2.6 (cm) = 19.24 (cm) Tissue and other material debrided: Non-Viable, Slough, Slough Level: Non-Viable Tissue Debridement Description: Selective/Open Wound Instrument: Curette Bleeding: Minimum Hemostasis Achieved: Pressure Response to Treatment: Procedure was tolerated well Level of Consciousness (Post- Awake and Alert procedure): Post Debridement Measurements of Total Wound Length: (cm) 7.4 Width: (cm) 2.6 Depth: (cm) 0.1 Volume: (cm) 1.511 Character of Wound/Ulcer Post Debridement: Improved Post Procedure Diagnosis Same as Pre-procedure Notes scribed for Dr. Celine Ahr by  Adline Peals, RN Electronic Signature(s) Signed: 02/22/2022 5:15:30 PM By: [name] MD FACS Signed: 02/22/2022 5:17:13 PM By: Adline Peals Entered By: Adline Peals on 02/22/2022 16:04:30 -------------------------------------------------------------------------------- HPI Details Patient Name: Date of Service: [name] [name], [name] [name]. 02/22/2022 3:45 PM Medical Record Number: [id number] Patient Account Number: [id number] Date of Birth/Sex: Treating RN: 09-09-1945 (76 y.o. M) Primary Care Provider: [name] Other Clinician: Referring Provider: Treating Provider/Extender: [name] in Treatment: 1 History of Present Illness HPI Description: ADMISSION 02/13/2022 This is [name] 76 year old morbidly obese type II diabetic (last hemoglobin A1c 6.6%) who presented to his primary care provider's office on November 7 with an ulcer on the top of his foot. He said it had been there for about [name] week. He is insensate in his feet and his wife had been caring for it. His PCP took [name] swab from the wound which grew back strep. [name] was prescribed and he was referred to the wound care center for further evaluation and management. In addition to diabetes, he also has pulmonary artery hypertension and diastolic congestive heart failure. 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 He has been seen at the voice and swallowing disorders clinic in [place] and no further intervention has been felt necessary. On exam, he has severe, at least stage II, lymphedema. In addition to the dorsal foot wound, he has ulcers on both lower legs. He has [name] left anterior lower leg, [name] right lateral lower leg, and [name] right anterior lower leg wound. ABIs in clinic were noncompressible. 02/22/2022: The right lateral leg wound has closed. The other wounds are about the same size to slightly smaller. All of them have [name] thick layer of  rubbery slough on the surface. Edema control is still suboptimal. Heavin, Haywood [name] ([id number]) [id number] Page 4 of 13 Electronic Signature(s) Signed: 02/22/2022 4:59:52 PM By: [name] MD FACS Entered By: [name] on 02/22/2022 16:59:52 -------------------------------------------------------------------------------- Physical Exam Details Patient Name: Date of Service: [name] [name], [name] [name]. 02/22/2022 3:45 PM Medical Record Number: [id number] Patient Account Number: [id number] Date of Birth/Sex: Treating RN: 1946-01-17 (76 y.o. M) Primary Care Provider: [name] Other Clinician: Referring Provider: Treating Provider/Extender: [name] in Treatment: 1 Constitutional He is hypertensive, but asymptomatic.. . . . No acute distress. Respiratory Normal work of breathing on supplemental oxygen. Notes 02/22/2022: The right lateral leg wound has closed. The other wounds are about the same size to slightly smaller. All of them have [name] thick layer of rubbery slough on the surface. Edema control is still suboptimal. Electronic  Signature(s) Signed: 02/22/2022 5:01:44 PM By: [name] MD FACS Entered By: [name] on 02/22/2022 17:01:44 -------------------------------------------------------------------------------- Physician Orders Details Patient Name: Date of Service: [name] [name], [name] [name]. 02/22/2022 3:45 PM Medical Record Number: [id number] Patient Account Number: [id number] Date of Birth/Sex: Treating RN: [birth year] (76 y.o. [name] Primary Care Provider: [name] Other Clinician: Referring Provider: Treating Provider/Extender: [name] in Treatment: 1 Verbal / Phone Orders: No Diagnosis Coding ICD-10 Coding Code Description [phone] Non-pressure chronic ulcer of other part of right foot with fat layer exposed L97.812 Non-pressure chronic ulcer of  other part of right lower leg with fat layer exposed L97.822 Non-pressure chronic ulcer of other part of left lower leg with fat layer exposed I50.30 Unspecified diastolic (congestive) heart failure J96.11 Chronic respiratory failure with hypoxia L03.115 Cellulitis of right lower limb E11.621 Type 2 diabetes mellitus with foot ulcer E66.01 Morbid (severe) obesity due to excess calories I89.0 Lymphedema, not elsewhere classified Follow-up Appointments ppointment in 1 week. - Dr. Celine Ahr - room 2 Return [name] [place], [place] ([id number]) [id number] Page 5 of 13 (In clinic) Topical Lidocaine 4% applied to wound bed Bathing/ Shower/ Hygiene May shower with protection but do not get wound dressing(s) wet. - can purchase [name] cast protector from [facility], [facility], [employer] Edema Control - Lymphedema / SCD / Other Elevate legs to the level of the heart or above for 30 minutes daily and/or when sitting, [name] frequency of: Avoid standing for long periods of time. Exercise regularly Wound Treatment Wound #1 - Foot Wound Laterality: Dorsal, Right Cleanser: Soap and Water 1 x Per Week/30 Days Discharge Instructions: May shower and wash wound with dial antibacterial soap and water prior to dressing change. Cleanser: Wound Cleanser 1 x Per Week/30 Days Discharge Instructions: Cleanse the wound with wound cleanser prior to applying [name] clean dressing using gauze sponges, not tissue or cotton balls. Peri-Wound Care: Sween Lotion (Moisturizing lotion) 1 x Per Week/30 Days Discharge Instructions: Apply moisturizing lotion as directed Prim Dressing: Hydrofera Blue Ready Foam, 2.5 x2.5 in 1 x Per Week/30 Days ary Discharge Instructions: Apply to wound bed as instructed Secondary Dressing: Woven Gauze Sponge, Non-Sterile 4x4 in 1 x Per Week/30 Days Discharge Instructions: Apply over primary dressing as directed. Secondary Dressing: Zetuvit Plus 4x8 in 1 x Per Week/30  Days Discharge Instructions: Apply over primary dressing as directed. Secured With: Transpore Surgical Tape, 2x10 (in/yd) 1 x Per Week/30 Days Discharge Instructions: Secure dressing with tape as directed. Wound #2 - Lower Leg Wound Laterality: Right, Anterior Cleanser: Soap and Water 1 x Per Week/30 Days Discharge Instructions: May shower and wash wound with dial antibacterial soap and water prior to dressing change. Cleanser: Wound Cleanser 1 x Per Week/30 Days Discharge Instructions: Cleanse the wound with wound cleanser prior to applying [name] clean dressing using gauze sponges, not tissue or cotton balls. Peri-Wound Care: Sween Lotion (Moisturizing lotion) 1 x Per Week/30 Days Discharge Instructions: Apply moisturizing lotion as directed Prim Dressing: Hydrofera Blue Ready Foam, 2.5 x2.5 in 1 x Per Week/30 Days ary Discharge Instructions: Apply to wound bed as instructed Secondary Dressing: ABD Pad, 5x9 1 x Per Week/30 Days Discharge Instructions: Apply over primary dressing as directed. Secondary Dressing: Woven Gauze Sponge, Non-Sterile 4x4 in 1 x Per Week/30 Days Discharge Instructions: Apply over primary dressing as directed. Secured With: Transpore Surgical Tape, 2x10 (in/yd) 1 x Per Week/30 Days Discharge Instructions: Secure dressing with tape as directed. Compression Stockings: Jobst Farrow Wrap 4000  Right Leg Compression Amount: 30-40 mmHG Discharge Instructions: Apply Francia Greaves daily as instructed. Apply first thing in the morning, remove at night before bed. Wound #4 - Lower Leg Wound Laterality: Left, Anterior Cleanser: Soap and Water 1 x Per Week/30 Days Discharge Instructions: May shower and wash wound with dial antibacterial soap and water prior to dressing change. Cleanser: Wound Cleanser 1 x Per Week/30 Days Discharge Instructions: Cleanse the wound with wound cleanser prior to applying [name] clean dressing using gauze sponges, not tissue or cotton balls. Peri-Wound Care:  Sween Lotion (Moisturizing lotion) 1 x Per Week/30 Days Discharge Instructions: Apply moisturizing lotion as directed Prim Dressing: Hydrofera Blue Ready Foam, 2.5 x2.5 in 1 x Per Week/30 Days ary Discharge Instructions: Apply to wound bed as instructed Secondary Dressing: ABD Pad, 5x9 1 x Per Week/30 Days Discharge Instructions: Apply over primary dressing as directed. Secondary Dressing: Woven Gauze Sponge, Non-Sterile 4x4 in 1 x Per Week/30 Days Discharge Instructions: Apply over primary dressing as directed. DHAWAN, Khyler [name] ([id number]) [id number] Page 6 of 13 Secured With: Transpore Surgical Tape, 2x10 (in/yd) 1 x Per Week/30 Days Discharge Instructions: Secure dressing with tape as directed. Compression Wrap: ThreePress (3 layer compression wrap) 1 x Per Week/30 Days Discharge Instructions: Apply three layer compression as directed. Compression Stockings: Jobst Farrow Wrap 4000 Left Leg Compression Amount: 30-40 mmHG Discharge Instructions: Apply Francia Greaves daily as instructed. Apply first thing in the morning, remove at night before bed. Patient Medications llergies: morphine, penicillin, codeine, propoxyphene, Darvocet-N [name] Notifications Medication Indication Start End 02/22/2022 lidocaine DOSE topical 4 % cream - cream topical Electronic Signature(s) Signed: 02/22/2022 5:15:30 PM By: [name] MD FACS Entered By: [name] on 02/22/2022 17:02:11 -------------------------------------------------------------------------------- Problem List Details Patient Name: Date of Service: [name] [name], [name] [name] 02/22/2022 3:45 PM Medical Record Number: [id number] Patient Account Number: [id number] Date of Birth/Sex: Treating RN: [birth year] (76 y.o. M) Primary Care Provider: [name] Other Clinician: Referring Provider: Treating Provider/Extender: [name] in Treatment: 1 Active  Problems ICD-10 Encounter Code Description Active Date MDM Diagnosis [id number] Non-pressure chronic ulcer of other part of right foot with fat layer exposed 02/13/2022 No Yes L97.812 Non-pressure chronic ulcer of other part of right lower leg with fat layer 02/13/2022 No Yes exposed L97.822 Non-pressure chronic ulcer of other part of left lower leg with fat layer exposed11/13/2023 No Yes I50.30 Unspecified diastolic (congestive) heart failure 02/13/2022 No Yes J96.11 Chronic respiratory failure with hypoxia 02/13/2022 No Yes L03.115 Cellulitis of right lower limb 02/13/2022 No Yes E11.621 Type 2 diabetes mellitus with foot ulcer 02/13/2022 No Yes [name] [name], [name] [name] ([id number]) [id number] Page 7 of 13 E66.01 Morbid (severe) obesity due to excess calories 02/13/2022 No Yes I89.0 Lymphedema, not elsewhere classified 02/13/2022 No Yes Inactive Problems Resolved Problems Electronic Signature(s) Signed: 02/22/2022 4:53:28 PM By: [name] MD FACS Entered By: [name] on 02/22/2022 16:53:28 -------------------------------------------------------------------------------- Progress Note Details Patient Name: Date of Service: [name] [name], [name] [name] 02/22/2022 3:45 PM Medical Record Number: [id number] Patient Account Number: [id number] Date of Birth/Sex: Treating RN: March 15, 1946 (76 y.o. M) Primary Care Provider: [name] Other Clinician: Referring Provider: Treating Provider/Extender: [name] in Treatment: 1 Subjective Chief Complaint Information obtained from Patient Patients presents for treatment of open ulcers secondary to lymphedema and diabetes History of Present Illness (HPI) ADMISSION 02/13/2022 This is [name] 76 year old morbidly obese type II diabetic (last hemoglobin A1c 6.6%) who presented to his primary care provider's office on November 7  with an ulcer on the top of his foot. He said it had been there for  about [name] week. He is insensate in his feet and his wife had been caring for it. His PCP took [name] swab from the wound which grew back strep. [name] was prescribed and he was referred to the wound care center for further evaluation and management. In addition to diabetes, he also has pulmonary artery hypertension and diastolic congestive heart failure. 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 He has been seen at the voice and swallowing disorders clinic in [place] and no further intervention has been felt necessary. On exam, he has severe, at least stage II, lymphedema. In addition to the dorsal foot wound, he has ulcers on both lower legs. He has [name] left anterior lower leg, [name] right lateral lower leg, and [name] right anterior lower leg wound. ABIs in clinic were noncompressible. 02/22/2022: The right lateral leg wound has closed. The other wounds are about the same size to slightly smaller. All of them have [name] thick layer of rubbery slough on the surface. Edema control is still suboptimal. Patient History Information obtained from Patient. Family History Cancer - Paternal Grandparents, Heart Disease - Maternal Grandparents,Father, No family history of Diabetes, Hereditary Spherocytosis, Hypertension, Kidney Disease, Lung Disease, Seizures, Stroke, Thyroid Problems, Tuberculosis. Social History Former smoker - smoked as [name] teen, Marital Status - Married, Alcohol Use - Never, Drug Use - No History, Caffeine Use - Never. Medical History Hematologic/Lymphatic Patient has history of Anemia, Lymphedema Respiratory Patient has history of Sleep Apnea Cardiovascular Patient has history of Arrhythmia - PAF, Congestive Heart Failure - diastolic, Hypertension Endocrine Patient has history of Type II Diabetes - takes no meds, "borderline A1c" Neurologic Patient has history of Neuropathy Oncologic [facility], [place] ([id number])  [id number] Page 8 of 13 Denies history of Received Chemotherapy, Received Radiation Hospitalization/Surgery History - right and left heart cath. - colonoscopy. - esophagogastroduodenoscopy. - subxyphoid pericardial window. - intraoperative transesophageal echocardiogram. - laparoscopic gastric banding with hiatal hernia repair. - breath tek h pylori. - cholecysectomy. - arthroscopy knee w/ drilling. - lithotripsy. - tonsillectomy. - total knee replacement. - trigger finger repair. - wisdom tooth extraction. Medical [name] Surgical History Notes nd Constitutional Symptoms (General Health) obsesity Hematologic/Lymphatic chronic anticoagulation Cardiovascular pericarditis Gastrointestinal pt says 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 but they could not prove it Genitourinary BPH Neurologic 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, embolic cerebral infarction, CVA Psychiatric depression Objective Constitutional He is hypertensive, but asymptomatic.[name] No acute distress. Vitals Time Taken: 3:42 PM, Height: 72 in, Weight: 375 lbs, BMI: 50.9, Temperature: 98.4 F, Pulse: 66 bpm, Respiratory Rate: 18 breaths/min, Blood Pressure: 166/61 mmHg. Respiratory Normal work of breathing on supplemental oxygen. General Notes: 02/22/2022: The right lateral leg wound has closed. The other wounds are about the same size to slightly smaller. All of them have [name] thick layer of rubbery slough on the surface. Edema control is still suboptimal. Integumentary (Hair, Skin) Wound #1 status is Open. Original cause of wound was Blister. The date acquired was: 01/31/2022. The wound has been in treatment 1 weeks. The wound is located on the Right,Dorsal Foot. The wound measures 2.4cm length x 4cm width x 0.1cm depth; 7.54cm^2 area and 0.754cm^3 volume. There is Fat Layer (Subcutaneous Tissue) exposed. There is no tunneling or undermining noted. There is [name] medium amount of serosanguineous drainage  noted. The wound margin is distinct with the outline  attached to the wound base. There is small (1-33%) red granulation within the wound bed. There is [name] large (67-100%) amount of necrotic tissue within the wound bed including Adherent Slough. The periwound skin appearance had no abnormalities noted for texture. The periwound skin appearance exhibited: Maceration, Erythema. The periwound skin appearance did not exhibit: Dry/Scaly. The surrounding wound skin color is noted with erythema. Periwound temperature was noted as No Abnormality. Wound #2 status is Open. Original cause of wound was Blister. The date acquired was: 02/13/2021. The wound has been in treatment 1 weeks. The wound is located on the Right,Anterior Lower Leg. The wound measures 1cm length x 1.5cm width x 0.1cm depth; 1.178cm^2 area and 0.118cm^3 volume. There is Fat Layer (Subcutaneous Tissue) exposed. There is no tunneling or undermining noted. There is [name] medium amount of serosanguineous drainage noted. The wound margin is distinct with the outline attached to the wound base. There is medium (34-66%) red, pink granulation within the wound bed. There is [name] medium (34-66%) amount of necrotic tissue within the wound bed including Eschar and Adherent Slough. The periwound skin appearance had no abnormalities noted for texture. The periwound skin appearance exhibited: Dry/Scaly, Hemosiderin Staining. Periwound temperature was noted as No Abnormality. Wound #3 status is Open. Original cause of wound was Blister. The date acquired was: 02/13/2021. The wound has been in treatment 1 weeks. The wound is located on the Right,Lateral Lower Leg. The wound measures 0cm length x 0cm width x 0cm depth; 0cm^2 area and 0cm^3 volume. There is no tunneling or undermining noted. There is [name] none present amount of drainage noted. The wound margin is distinct with the outline attached to the wound base. There is no granulation within the wound bed. There is no  necrotic tissue within the wound bed. The periwound skin appearance had no abnormalities noted for texture. The periwound skin appearance exhibited: Dry/Scaly, Hemosiderin Staining. Periwound temperature was noted as No Abnormality. Wound #4 status is Open. Original cause of wound was Blister. The date acquired was: 02/13/2021. The wound has been in treatment 1 weeks. The wound is located on the Left,Anterior Lower Leg. The wound measures 7.4cm length x 2.6cm width x 0.1cm depth; 15.111cm^2 area and 1.511cm^3 volume. There is Fat Layer (Subcutaneous Tissue) exposed. There is no tunneling or undermining noted. There is [name] medium amount of serosanguineous drainage noted. The wound margin is distinct with the outline attached to the wound base. There is medium (34-66%) red, pink granulation within the wound bed. There is [name] medium (34-66%) amount of necrotic tissue within the wound bed including Adherent Slough. The periwound skin appearance had no abnormalities noted for texture. The periwound skin appearance exhibited: Dry/Scaly, Hemosiderin Staining, Erythema. The surrounding wound skin color is noted with erythema. Periwound temperature was noted as No Abnormality. 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 part of left lower leg with fat layer exposed Unspecified diastolic (congestive) heart failure Chronic respiratory failure with hypoxia [name] [name], [name] [name] ([id number]) [id number] Page 9 of 13 Cellulitis of right lower limb Type 2 diabetes mellitus with foot ulcer Morbid (severe) obesity due to excess calories Lymphedema, not elsewhere classified Procedures Wound #1 Pre-procedure diagnosis of Wound #1 is [name] Diabetic Wound/Ulcer of the Lower Extremity located on the Right,Dorsal  Foot .Severity of Tissue Pre Debridement is: Fat layer exposed. There was [name]  Selective/Open Wound Non-Viable Tissue Debridement with [name] total area of 9.6 sq cm performed by [name], MD. With the following instrument(s): Curette to remove Non-Viable tissue/material. Material removed includes [facility] after achieving pain control using Lidocaine 4% T opical Solution. No specimens were taken. [name] time out was conducted at 15:58, prior to the start of the procedure. [name] Minimum amount of bleeding was controlled with Pressure. The procedure was tolerated well. Post Debridement Measurements: 2.4cm length x 4cm width x 0.1cm depth; 0.754cm^3 volume. Character of Wound/Ulcer Post Debridement is improved. Severity of Tissue Post Debridement is: Fat layer exposed. Post procedure Diagnosis Wound #1: Same as Pre-Procedure General Notes: scribed for Dr. Celine Ahr by Adline Peals, RN. Wound #2 Pre-procedure diagnosis of Wound #2 is [name] Lymphedema located on the Right,Anterior Lower Leg . There was [name] Selective/Open Wound Non-Viable Tissue Debridement with [name] total area of 1.5 sq cm performed by [name], MD. With the following instrument(s): Curette to remove Non-Viable tissue/material. Material removed includes Eschar and Slough and after achieving pain control using Lidocaine 4% Topical Solution. No specimens were taken. [name] time out was conducted at 15:58, prior to the start of the procedure. [name] Minimum amount of bleeding was controlled with Pressure. The procedure was tolerated well. Post Debridement Measurements: 1cm length x 1.5cm width x 0.1cm depth; 0.118cm^3 volume. Character of Wound/Ulcer Post Debridement is improved. Post procedure Diagnosis Wound #2: Same as Pre-Procedure General Notes: scribed for Dr. Celine Ahr by Adline Peals, RN. Pre-procedure diagnosis of Wound #2 is [name] Lymphedema located on the Right,Anterior Lower Leg . There was [name] Three Layer Compression Therapy Procedure by Adline Peals, RN. Post procedure Diagnosis Wound #2: Same as Pre-Procedure Wound #4 Pre-procedure diagnosis of Wound #4 is [name] Lymphedema located on the Left,Anterior Lower Leg . There was [name] Selective/Open Wound Non-Viable Tissue Debridement with [name] total area of 19.24 sq cm performed by [name], MD. With the following instrument(s): Curette to remove Non-Viable tissue/material. Material removed includes [facility] after achieving pain control using Lidocaine 4% Topical Solution. No specimens were taken. [name] time out was conducted at 15:58, prior to the start of the procedure. [name] Minimum amount of bleeding was controlled with Pressure. The procedure was tolerated well. Post Debridement Measurements: 7.4cm length x 2.6cm width x 0.1cm depth; 1.511cm^3 volume. Character of Wound/Ulcer Post Debridement is improved. Post procedure Diagnosis Wound #4: Same as Pre-Procedure General Notes: scribed for Dr. Celine Ahr by Adline Peals, RN. Pre-procedure diagnosis of Wound #4 is [name] Lymphedema located on the Left,Anterior Lower Leg . There was [name] Three Layer Compression Therapy Procedure by Adline Peals, RN. Post procedure Diagnosis Wound #4: Same as Pre-Procedure Plan Follow-up Appointments: Return Appointment in 1 week. - Dr. Celine Ahr - room 2 Anesthetic: (In clinic) Topical Lidocaine 4% applied to wound bed Bathing/ Shower/ Hygiene: May shower with protection but do not get wound dressing(s) wet. - can purchase [name] cast protector from [facility], [facility], [employer] Edema Control - Lymphedema / SCD / Other: Elevate legs to the level of the heart or above for 30 minutes daily and/or when sitting, [name] frequency of: Avoid standing for long periods of time. Exercise regularly The following medication(s) was prescribed: lidocaine topical 4 % cream cream topical was prescribed at facility WOUND #1: - Foot Wound Laterality: Dorsal, Right Cleanser: Soap and Water 1 x Per Week/30 Days Discharge Instructions: May shower and  wash wound with dial antibacterial soap and water prior to dressing change. Cleanser: Wound Cleanser 1 x Per Week/30 Days Discharge Instructions:  Cleanse the wound with wound cleanser prior to applying [name] clean dressing using gauze sponges, not tissue or cotton balls. Peri-Wound Care: Sween Lotion (Moisturizing lotion) 1 x Per Week/30 Days Discharge Instructions: Apply moisturizing lotion as directed Prim Dressing: Hydrofera Blue Ready Foam, 2.5 x2.5 in 1 x Per Week/30 Days ary Discharge Instructions: Apply to wound bed as instructed Secondary Dressing: Woven Gauze Sponge, Non-Sterile 4x4 in 1 x Per Week/30 Days Discharge Instructions: Apply over primary dressing as directed. Secondary Dressing: Zetuvit Plus 4x8 in 1 x Per Week/30 Days Discharge Instructions: Apply over primary dressing as directed. Secured With: Transpore Surgical T ape, 2x10 (in/yd) 1 x Per Week/30 Days Discharge Instructions: Secure dressing with tape as directed. WOUND #2: - Lower Leg Wound Laterality: Right, Anterior Cleanser: Soap and Water 1 x Per Week/30 Days Discharge Instructions: May shower and wash wound with dial antibacterial soap and water prior to dressing change. Cleanser: Wound Cleanser 1 x Per Week/30 Days Discharge Instructions: Cleanse the wound with wound cleanser prior to applying [name] clean dressing using gauze sponges, not tissue or cotton balls. HOLTSCLAW, Shelly [name] ([id number]) [id number] Page 10 of 13 Peri-Wound Care: Sween Lotion (Moisturizing lotion) 1 x Per Week/30 Days Discharge Instructions: Apply moisturizing lotion as directed Prim Dressing: Hydrofera Blue Ready Foam, 2.5 x2.5 in 1 x Per Week/30 Days ary Discharge Instructions: Apply to wound bed as instructed Secondary Dressing: ABD Pad, 5x9 1 x Per Week/30 Days Discharge Instructions: Apply over primary dressing as directed. Secondary Dressing: Woven Gauze Sponge, Non-Sterile 4x4 in 1 x Per Week/30 Days Discharge  Instructions: Apply over primary dressing as directed. Secured With: Transpore Surgical T ape, 2x10 (in/yd) 1 x Per Week/30 Days Discharge Instructions: Secure dressing with tape as directed. Com pression Stockings: Jobst Farrow Wrap 4000 Compression Amount: 30-40 mmHg (right) Discharge Instructions: Apply Francia Greaves daily as instructed. Apply first thing in the morning, remove at night before bed. WOUND #4: - Lower Leg Wound Laterality: Left, Anterior Cleanser: Soap and Water 1 x Per Week/30 Days Discharge Instructions: May shower and wash wound with dial antibacterial soap and water prior to dressing change. Cleanser: Wound Cleanser 1 x Per Week/30 Days Discharge Instructions: Cleanse the wound with wound cleanser prior to applying [name] clean dressing using gauze sponges, not tissue or cotton balls. Peri-Wound Care: Sween Lotion (Moisturizing lotion) 1 x Per Week/30 Days Discharge Instructions: Apply moisturizing lotion as directed Prim Dressing: Hydrofera Blue Ready Foam, 2.5 x2.5 in 1 x Per Week/30 Days ary Discharge Instructions: Apply to wound bed as instructed Secondary Dressing: ABD Pad, 5x9 1 x Per Week/30 Days Discharge Instructions: Apply over primary dressing as directed. Secondary Dressing: Woven Gauze Sponge, Non-Sterile 4x4 in 1 x Per Week/30 Days Discharge Instructions: Apply over primary dressing as directed. Secured With: Transpore Surgical T ape, 2x10 (in/yd) 1 x Per Week/30 Days Discharge Instructions: Secure dressing with tape as directed. Com pression Wrap: ThreePress (3 layer compression wrap) 1 x Per Week/30 Days Discharge Instructions: Apply three layer compression as directed. Com pression Stockings: Jobst Farrow Wrap 4000 Compression Amount: 30-40 mmHg (left) Discharge Instructions: Apply Francia Greaves daily as instructed. Apply first thing in the morning, remove at night before bed. 02/22/2022: The right lateral leg wound has closed. The other wounds are about the  same size to slightly smaller. All of them have [name] thick layer of rubbery slough on the surface. Edema control is still suboptimal. I used [name] curette to debride the slough from all of his wounds. We will  continue Hydrofera Blue with 3 layer compression. T combat the slippage that occurred o with his wraps over the past week, we will add [name] layer of Unna boot at the top of his wraps. He will follow-up in 1 week. Electronic Signature(s) Signed: 02/22/2022 5:15:30 PM By: [name] MD FACS Signed: 02/22/2022 5:17:13 PM By: Adline Peals Previous Signature: 02/22/2022 5:03:01 PM Version By: [name] MD FACS Entered By: Adline Peals on 02/22/2022 17:14:36 -------------------------------------------------------------------------------- HxROS Details Patient Name: Date of Service: [place], Keyshawn [name]. 02/22/2022 3:45 PM Medical Record Number: [id number] Patient Account Number: [id number] Date of Birth/Sex: Treating RN: [birth year] (76 y.o. M) Primary Care Provider: [name] Other Clinician: Referring Provider: Treating Provider/Extender: [name] in Treatment: 1 Information Obtained From Patient Constitutional Symptoms (General Health) Medical History: Past Medical History Notes: obsesity Hematologic/Lymphatic Medical History: Positive for: Anemia; Lymphedema Past Medical History Notes: chronic anticoagulation [place], Gar [name] ([id number]) [id number] Page 11 of 13 Respiratory Medical History: Positive for: Sleep Apnea Cardiovascular Medical History: Positive for: Arrhythmia - PAF; Congestive Heart Failure - diastolic; Hypertension Past Medical History Notes: pericarditis Gastrointestinal Medical History: Past Medical History Notes: pt says 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 but they could not prove it Endocrine Medical History: Positive for: Type II Diabetes - takes no  meds, "borderline A1c" Time with diabetes: 2-3 years ago Blood sugar tested every day: No Genitourinary Medical History: Past Medical History Notes: BPH Neurologic Medical History: Positive for: Neuropathy Past Medical History Notes: 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, embolic cerebral infarction, CVA Oncologic Medical History: Negative for: Received Chemotherapy; Received Radiation Psychiatric Medical History: Past Medical History Notes: depression Immunizations Pneumococcal Vaccine: Received Pneumococcal Vaccination: Yes Received Pneumococcal Vaccination On or After 60th Birthday: Yes Implantable Devices None Hospitalization / Surgery History Type of Hospitalization/Surgery right and left heart cath colonoscopy esophagogastroduodenoscopy subxyphoid pericardial window intraoperative transesophageal echocardiogram laparoscopic gastric banding with hiatal hernia repair breath tek h pylori cholecysectomy arthroscopy knee w/ drilling lithotripsy tonsillectomy total knee replacement trigger finger repair wisdom tooth extraction Family and Social History Cancer: Yes - Paternal Grandparents; Diabetes: No; Heart Disease: Yes - Maternal Grandparents,Father; Hereditary Spherocytosis: No; Hypertension: No; Kidney Disease: No; Lung Disease: No; Seizures: No; Stroke: No; Thyroid Problems: No; Tuberculosis: No; Former smoker - smoked as [name] teen; Marital Correa, Myan [name] ([id number]) [id number] Page 12 of 13 Status - Married; Alcohol Use: Never; Drug Use: No History; Caffeine Use: Never; Financial Concerns: No; Food, Clothing or Shelter Needs: No; Support System Lacking: No; Transportation Concerns: No Electronic Signature(s) Signed: 02/22/2022 5:15:30 PM By: [name] MD FACS Entered By: [name] on 02/22/2022 16:59:59 -------------------------------------------------------------------------------- SuperBill Details Patient Name: Date of  Service: [name] [name], Dylin [name]. 02/22/2022 Medical Record Number: [id number] Patient Account Number: [id number] Date of Birth/Sex: Treating RN: 07/08/1945 (76 y.o. M) Primary Care Provider: [name] Other Clinician: Referring Provider: Treating Provider/Extender: [name] in Treatment: 1 Diagnosis Coding ICD-10 Codes Code Description [phone] Non-pressure chronic ulcer of other part of right foot with fat layer exposed L97.812 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d L97.822 Non-pressure chronic ulcer of other part of left lower leg with fat layer exposed I50.30 Unspecified diastolic (congestive) heart failure J96.11 Chronic respiratory failure with hypoxia L03.115 Cellulitis of right lower limb E11.621 Type 2 diabetes mellitus with foot ulcer E66.01 Morbid (severe) obesity due to excess calories I89.0 Lymphedema, not elsewhere classified Facility Procedures : CPT4 Code: [id number] Description:  57903 - DEBRIDE WOUND 1ST 20 SQ CM OR < ICD-10 Diagnosis Description L97.512 Non-pressure chronic ulcer of other part of right foot with fat layer exposed L97.812 Non-pressure chronic ulcer of other part of right lower leg with fat layer  expose L97.822 Non-pressure chronic ulcer of other part of left lower leg with fat layer exposed Modifier: d Quantity: 1 : CPT4 Code: [id number] Description: 19166 - DEBRIDE WOUND EA ADDL 20 SQ CM ICD-10 Diagnosis Description L97.512 Non-pressure chronic ulcer of other part of right foot with fat layer exposed L97.812 Non-pressure chronic ulcer of other part of right lower leg with fat layer  expose L97.822 Non-pressure chronic ulcer of other part of left lower leg with fat layer exposed Modifier: d Quantity: 1 Physician Procedures : CPT4 Code Description Modifier [id number] - WC PHYS LEVEL 3 - EST PT 25 ICD-10 Diagnosis Description L97.512 Non-pressure chronic ulcer of other part of  right foot with fat layer exposed L97.812 Non-pressure chronic ulcer of other part of right  lower leg with fat layer exposed L97.822 Non-pressure chronic ulcer of other part of left lower leg with fat layer exposed I89.0 Lymphedema, not elsewhere classified Quantity: 1 : [id number] 39532 - WC PHYS DEBR WO ANESTH 20 SQ CM ICD-10 Diagnosis Description L97.512 Non-pressure chronic ulcer of other part of right foot with fat layer exposed Y23.343 Non-pressure chronic ulcer of other part of right lower leg with fat layer  exposed L97.822 Non-pressure chronic ulcer of other part of left lower leg with fat layer exposed Quantity: 1 : [id number] 37290 - WC PHYS DEBR WO ANESTH EA ADD 20 CM Van, Devlyn [name] ([id number]) [id number]. ICD-10 Diagnosis Description L97.512 Non-pressure chronic ulcer of other part of right foot with fat layer exposed L97.812  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d L97.822 Non-pressure chronic ulcer of other part of left lower leg with fat layer exposed Quantity: 1 pdf Page 13 of 13 Electronic Signature(s) Signed: 02/22/2022 5:03:31 PM By: [name] MD FACS Entered By: [name] on 02/22/2022 17:03:31

## 2022-02-24 ENCOUNTER — Other Ambulatory Visit: Payer: Self-pay | Admitting: Internal Medicine

## 2022-02-27 NOTE — Telephone Encounter (Signed)
Ropinirole refilled 

## 2022-03-02 ENCOUNTER — Other Ambulatory Visit: Payer: Self-pay | Admitting: Internal Medicine

## 2022-03-02 ENCOUNTER — Encounter (HOSPITAL_BASED_OUTPATIENT_CLINIC_OR_DEPARTMENT_OTHER): Payer: Medicare Other | Admitting: General Surgery

## 2022-03-02 DIAGNOSIS — I480 Paroxysmal atrial fibrillation: Secondary | ICD-10-CM

## 2022-03-02 DIAGNOSIS — L97512 Non-pressure chronic ulcer of other part of right foot with fat layer exposed: Secondary | ICD-10-CM | POA: Diagnosis not present

## 2022-03-02 DIAGNOSIS — I110 Hypertensive heart disease with heart failure: Secondary | ICD-10-CM | POA: Diagnosis not present

## 2022-03-02 DIAGNOSIS — L97822 Non-pressure chronic ulcer of other part of left lower leg with fat layer exposed: Secondary | ICD-10-CM | POA: Diagnosis not present

## 2022-03-02 DIAGNOSIS — L97812 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d: Secondary | ICD-10-CM | POA: Diagnosis not present

## 2022-03-02 DIAGNOSIS — I890 Lymphedema, not elsewhere classified: Secondary | ICD-10-CM | POA: Diagnosis not present

## 2022-03-02 DIAGNOSIS — E11622 Type 2 diabetes mellitus with other skin ulcer: Secondary | ICD-10-CM | POA: Diagnosis not present

## 2022-03-02 DIAGNOSIS — E11621 Type 2 diabetes mellitus with foot ulcer: Secondary | ICD-10-CM | POA: Diagnosis not present

## 2022-03-02 NOTE — Progress Notes (Addendum)
[name] ([id number]) [id number] Page 1 of 11 Visit Report for 03/02/2022 Chief Complaint Document Details Patient Name: Date of Service: [name], [place] [name]. 03/02/2022 2:00 PM Medical Record Number: [id number] Patient Account Number: [id number] Date of Birth/Sex: Treating RN: 24-Oct-1945 (76 y.o. M) Primary Care Provider: [name] Other Clinician: Referring Provider: Treating Provider/Extender: [name] in Treatment: 2 Information Obtained from: Patient Chief Complaint Patients presents for treatment of open ulcers secondary to lymphedema and diabetes Electronic Signature(s) Signed: 03/02/2022 2:33:16 PM By: [name] MD FACS Entered By: [name] on 03/02/2022 14:33:16 -------------------------------------------------------------------------------- Debridement Details Patient Name: Date of Service: [name], [name]. 03/02/2022 2:00 PM Medical Record Number: [id number] Patient Account Number: [id number] Date of Birth/Sex: Treating RN: 12/28/1945 (76 y.o. [name] Primary Care Provider: [name] Other Clinician: Referring Provider: Treating Provider/Extender: [name] in Treatment: 2 Debridement Performed for Assessment: Wound #4 Left,Anterior Lower Leg Performed By: Physician [name], MD Debridement Type: Debridement Level of Consciousness (Pre-procedure): Awake and Alert Pre-procedure Verification/Time Out Yes - 14:51 Taken: Start Time: 14:51 Pain Control: Lidocaine 4% T opical Solution T Area Debrided (L x W): otal 8.5 (cm) x 3 (cm) = 25.5 (cm) Tissue and other material debrided: Non-Viable, Slough, Subcutaneous, Slough Level: Skin/Subcutaneous Tissue Debridement Description: Excisional Instrument: Curette Specimen: Tissue Culture Number of Specimens T aken: 1 Bleeding: Minimum Hemostasis Achieved: Pressure Response to  Treatment: Procedure was tolerated well Level of Consciousness (Post- Awake and Alert procedure): Post Debridement Measurements of Total Wound Length: (cm) 8.5 Width: (cm) 7.3 Depth: (cm) 0.1 Volume: (cm) 4.873 Character of Wound/Ulcer Post Debridement: Improved Post Procedure Diagnosis Same as Pre-procedure [name], [name] ([id number]) [id number] Page 2 of 11 Notes scribed for Dr. Celine Ahr by Adline Peals, RN Electronic Signature(s) Signed: 03/02/2022 3:04:46 PM By: [name] MD FACS Signed: 03/02/2022 5:21:53 PM By: Sabas Sous By: Adline Peals on 03/02/2022 14:54:14 -------------------------------------------------------------------------------- Debridement Details Patient Name: Date of Service: [name], [name]. 03/02/2022 2:00 PM Medical Record Number: [id number] Patient Account Number: [id number] Date of Birth/Sex: Treating RN: April 28, 1945 (76 y.o. [name] Primary Care Provider: [name] Other Clinician: Referring Provider: Treating Provider/Extender: [name] in Treatment: 2 Debridement Performed for Assessment: Wound #1 Right,Dorsal Foot Performed By: Physician [name], MD Debridement Type: Debridement Severity of Tissue Pre Debridement: Fat layer exposed Level of Consciousness (Pre-procedure): Awake and Alert Pre-procedure Verification/Time Out Yes - 14:51 Taken: Start Time: 14:51 Pain Control: Lidocaine 4% T opical Solution T Area Debrided (L x W): otal 1.5 (cm) x 2.5 (cm) = 3.75 (cm) Tissue and other material debrided: Non-Viable, Eschar, Slough, Subcutaneous, Slough Level: Skin/Subcutaneous Tissue Debridement Description: Excisional Instrument: Curette Bleeding: Minimum Hemostasis Achieved: Pressure Response to Treatment: Procedure was tolerated well Level of Consciousness (Post- Awake and Alert procedure): Post Debridement Measurements  of Total Wound Length: (cm) 1.5 Width: (cm) 2.5 Depth: (cm) 0.1 Volume: (cm) 0.295 Character of Wound/Ulcer Post Debridement: Improved Severity of Tissue Post Debridement: Fat layer exposed Post Procedure Diagnosis Same as Pre-procedure Notes scribed for Dr. Celine Ahr by Adline Peals, RN Electronic Signature(s) Signed: 03/02/2022 3:04:46 PM By: [name] MD FACS Signed: 03/02/2022 5:21:53 PM By: Adline Peals Entered By: Adline Peals on 03/02/2022 14:55:05 HPI Details -------------------------------------------------------------------------------- [name] ([id number]) [id number] Page 3 of 11 Patient Name: Date of Service: [name] 03/02/2022 2:00 PM Medical Record Number: [id number] Patient Account Number: [id number] Date of Birth/Sex: Treating RN:  [birth year] (76 y.o. M) Primary Care Provider: [name] Other Clinician: Referring Provider: Treating Provider/Extender: [name] in Treatment: 2 History of Present Illness HPI Description: ADMISSION 02/13/2022 This is [name] 76 year old morbidly obese type II diabetic (last hemoglobin A1c 6.6%) who presented to his primary care provider's office on November 7 with an ulcer on the top of his foot. He said it had been there for about [name] week. He is insensate in his feet and his wife had been caring for it. His PCP took [name] swab from the wound which grew back strep. [name] was prescribed and he was referred to the wound care center for further evaluation and management. In addition to diabetes, he also has pulmonary hypertension and diastolic congestive heart failure. 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 He has been seen at the voice and swallowing disorders clinic in [place] and no further intervention has been felt necessary. On exam, he has severe, at least stage II, lymphedema. In  addition to the dorsal foot wound, he has ulcers on both lower legs. He has [name] left anterior lower leg, [name] right lateral lower leg, and [name] right anterior lower leg wound. ABIs in clinic were noncompressible. 02/22/2022: The right lateral leg wound has closed. The other wounds are about the same size to slightly smaller. All of them have [name] thick layer of rubbery slough on the surface. Edema control is still suboptimal. 03/02/2022: The left anterior leg wound has deteriorated. His leg is more swollen and red. There is thick slough accumulation on the surface. The right dorsal foot wound is smaller, but also has substantial slough buildup. Electronic Signature(s) Signed: 03/02/2022 2:58:30 PM By: [name] MD FACS Entered By: [name] on 03/02/2022 14:58:30 -------------------------------------------------------------------------------- Physical Exam Details Patient Name: Date of Service: [name], Adrion [name]. 03/02/2022 2:00 PM Medical Record Number: [id number] Patient Account Number: [id number] Date of Birth/Sex: Treating RN: Aug 01, 1945 (76 y.o. M) Primary Care Provider: [name] Other Clinician: Referring Provider: Treating Provider/Extender: [name] in Treatment: 2 Constitutional ..... Respiratory Normal work of breathing on supplemental oxygen. Notes 03/02/2022: The left anterior leg wound has deteriorated. His leg is more swollen and red. There is thick slough accumulation on the surface. The right dorsal foot wound is smaller, but also has substantial slough buildup. Electronic Signature(s) Signed: 03/02/2022 2:59:58 PM By: [name] MD FACS Entered By: [name] on 03/02/2022 14:59:58 -------------------------------------------------------------------------------- Physician Orders Details Patient Name: Date of Service: [name], [name]. 03/02/2022 2:00 PM Medical Record Number: [id number] Patient Account Number:  [id number] Date of Birth/Sex: Treating RN: 31-May-1945 (76 y.o. [name] Primary Care Provider: [name] Other Clinician: [name] ([id number]) [phone].pdf Page 4 of 11 Referring Provider: Treating Provider/Extender: [name] in Treatment: 2 Verbal / Phone Orders: No Diagnosis Coding ICD-10 Coding Code Description [phone] Non-pressure chronic ulcer of other part of right foot with fat layer exposed L97.812 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d L97.822 Non-pressure chronic ulcer of other part of left lower leg with fat layer exposed I50.30 Unspecified diastolic (congestive) heart failure J96.11 Chronic respiratory failure with hypoxia L03.115 Cellulitis of right lower limb E11.621 Type 2 diabetes mellitus with foot ulcer E66.01 Morbid (severe) obesity due to excess calories I89.0 Lymphedema, not elsewhere classified Follow-up Appointments ppointment in 1 week. - Dr. Celine Ahr - room 2 Return  [name] Anesthetic (In clinic) Topical Lidocaine 4% applied to wound bed Bathing/ Shower/ Hygiene May shower with protection but do not get wound dressing(s) wet. - can purchase [name] cast protector from [facility], [facility], [employer] Edema Control - Lymphedema / SCD / Other Elevate legs to the level of the heart or above for 30 minutes daily and/or when sitting, [name] frequency of: Avoid standing for long periods of time. Exercise regularly Wound Treatment Wound #1 - Foot Wound Laterality: Dorsal, Right Cleanser: Soap and Water 1 x Per Week/30 Days Discharge Instructions: May shower and wash wound with dial antibacterial soap and water prior to dressing change. Cleanser: Wound Cleanser 1 x Per Week/30 Days Discharge Instructions: Cleanse the wound with wound cleanser prior to applying [name] clean dressing using gauze sponges, not tissue or cotton balls. Peri-Wound Care: Sween Lotion (Moisturizing  lotion) 1 x Per Week/30 Days Discharge Instructions: Apply moisturizing lotion as directed Prim Dressing: Sorbalgon AG Dressing 2x2 (in/in) 1 x Per Week/30 Days ary Discharge Instructions: Apply to wound bed as instructed Secondary Dressing: Woven Gauze Sponge, Non-Sterile 4x4 in 1 x Per Week/30 Days Discharge Instructions: Apply over primary dressing as directed. Secondary Dressing: Zetuvit Plus 4x8 in 1 x Per Week/30 Days Discharge Instructions: Apply over primary dressing as directed. Secured With: Transpore Surgical Tape, 2x10 (in/yd) 1 x Per Week/30 Days Discharge Instructions: Secure dressing with tape as directed. Wound #4 - Lower Leg Wound Laterality: Left, Anterior Cleanser: Soap and Water 1 x Per Week/30 Days Discharge Instructions: May shower and wash wound with dial antibacterial soap and water prior to dressing change. Cleanser: Wound Cleanser 1 x Per Week/30 Days Discharge Instructions: Cleanse the wound with wound cleanser prior to applying [name] clean dressing using gauze sponges, not tissue or cotton balls. Peri-Wound Care: Sween Lotion (Moisturizing lotion) 1 x Per Week/30 Days Discharge Instructions: Apply moisturizing lotion as directed Topical: Mupirocin Ointment 1 x Per Week/30 Days Discharge Instructions: Apply Mupirocin (Bactroban) as instructed Prim Dressing: Sorbalgon AG Dressing, 4x4 (in/in) 1 x Per Week/30 Days ary Discharge Instructions: Apply to wound bed as instructed Secondary Dressing: ABD Pad, 5x9 1 x Per Week/30 Days Discharge Instructions: Apply over primary dressing as directed. [name], [name] ([id number]) [id number] Page 5 of 11 Secondary Dressing: Woven Gauze Sponge, Non-Sterile 4x4 in 1 x Per Week/30 Days Discharge Instructions: Apply over primary dressing as directed. Secured With: Transpore Surgical Tape, 2x10 (in/yd) 1 x Per Week/30 Days Discharge Instructions: Secure dressing with tape as directed. Compression Wrap:  ThreePress (3 layer compression wrap) 1 x Per Week/30 Days Discharge Instructions: Apply three layer compression as directed. Compression Stockings: Jobst Farrow Wrap 4000 Left Leg Compression Amount: 30-40 mmHG Discharge Instructions: Apply Francia Greaves daily as instructed. Apply first thing in the morning, remove at night before bed. Laboratory naerobe culture (MICRO) - PCR of nonhealing wound to left leg - (ICD10 [id number] - Non-pressure Bacteria identified in Unspecified specimen by [name] chronic ulcer of other part of left lower leg with fat layer exposed) LOINC Code: 213-0 Convenience Name: Anaerobic culture Patient Medications llergies: morphine, penicillin, codeine, propoxyphene, Darvocet-N [name] Notifications Medication Indication Start End 03/02/2022 lidocaine DOSE topical 4 % cream - cream topical Electronic Signature(s) Signed: 03/02/2022 3:45:18 PM By: [name] MD FACS Signed: 03/02/2022 5:21:53 PM By: Adline Peals Previous Signature: 03/02/2022 3:04:46 PM Version By: [name] MD FACS Entered By: Adline Peals on 03/02/2022 15:16:48 -------------------------------------------------------------------------------- Problem List Details Patient Name: Date of Service: [name], [name]. 03/02/2022 2:00 PM Medical Record  Number: [id number] Patient Account Number: [id number] Date of Birth/Sex: Treating RN: 11/21/1945 (76 y.o. M) Primary Care Provider: [name] Other Clinician: Referring Provider: Treating Provider/Extender: [name] in Treatment: 2 Active Problems ICD-10 Encounter Code Description Active Date MDM Diagnosis [phone] Non-pressure chronic ulcer of other part of right foot with fat layer exposed 02/13/2022 No Yes L97.812 Non-pressure chronic ulcer of other part of right lower leg with fat layer 02/13/2022 No Yes exposed L97.822 Non-pressure chronic ulcer of other part of left lower leg with fat layer  exposed11/13/2023 No Yes I50.30 Unspecified diastolic (congestive) heart failure 02/13/2022 No Yes [name], [name] ([id number]) [id number] Page 6 of 11 J96.11 Chronic respiratory failure with hypoxia 02/13/2022 No Yes L03.115 Cellulitis of right lower limb 02/13/2022 No Yes E11.621 Type 2 diabetes mellitus with foot ulcer 02/13/2022 No Yes E66.01 Morbid (severe) obesity due to excess calories 02/13/2022 No Yes I89.0 Lymphedema, not elsewhere classified 02/13/2022 No Yes Inactive Problems Resolved Problems Electronic Signature(s) Signed: 03/02/2022 2:33:04 PM By: [name] MD FACS Entered By: [name] on 03/02/2022 14:33:04 -------------------------------------------------------------------------------- Progress Note Details Patient Name: Date of Service: [name], [name]. 03/02/2022 2:00 PM Medical Record Number: [id number] Patient Account Number: [id number] Date of Birth/Sex: Treating RN: 24-Mar-1946 (76 y.o. M) Primary Care Provider: [name] Other Clinician: Referring Provider: Treating Provider/Extender: [name] in Treatment: 2 Subjective Chief Complaint Information obtained from Patient Patients presents for treatment of open ulcers secondary to lymphedema and diabetes History of Present Illness (HPI) ADMISSION 02/13/2022 This is [name] 76 year old morbidly obese type II diabetic (last hemoglobin A1c 6.6%) who presented to his primary care provider's office on November 7 with an ulcer on the top of his foot. He said it had been there for about [name] week. He is insensate in his feet and his wife had been caring for it. His PCP took [name] swab from the wound which grew back strep. [name] was prescribed and he was referred to the wound care center for further evaluation and management. In addition to diabetes, he also has pulmonary hypertension and diastolic congestive heart failure. 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 He has been seen at the voice and swallowing disorders clinic in [place] and no further intervention has been felt necessary. On exam, he has severe, at least stage II, lymphedema. In addition to the dorsal foot wound, he has ulcers on both lower legs. He has [name] left anterior lower leg, [name] right lateral lower leg, and [name] right anterior lower leg wound. ABIs in clinic were noncompressible. 02/22/2022: The right lateral leg wound has closed. The other wounds are about the same size to slightly smaller. All of them have [name] thick layer of rubbery slough on the surface. Edema control is still suboptimal. 03/02/2022: The left anterior leg wound has deteriorated. His leg is more swollen and red. There is thick slough accumulation on the surface. The right dorsal foot wound is smaller, but also has substantial slough buildup. Patient History Information obtained from Patient. Family History Cancer - Paternal Grandparents, Heart Disease - Maternal Grandparents,Father, No family history of Diabetes, Hereditary Spherocytosis, Hypertension, Kidney Disease, Lung Disease, Seizures, Stroke, Thyroid Problems, Tuberculosis. [name], [name] ([id number]) [id number] Page 7 of 21 Social History Former smoker - smoked as [name] teen, Marital Status - Married, Alcohol Use - Never, Drug Use - No History, Caffeine Use - Never. Medical History Hematologic/Lymphatic Patient has history  of Anemia, Lymphedema Respiratory Patient has history of Sleep Apnea Cardiovascular Patient has history of Arrhythmia - PAF, Congestive Heart Failure - diastolic, Hypertension Endocrine Patient has history of Type II Diabetes - takes no meds, "borderline A1c" Neurologic Patient has history of Neuropathy Oncologic Denies history of Received Chemotherapy, Received Radiation Hospitalization/Surgery History - right and left heart cath. -  colonoscopy. - esophagogastroduodenoscopy. - subxyphoid pericardial window. - intraoperative transesophageal echocardiogram. - laparoscopic gastric banding with hiatal hernia repair. - breath tek h pylori. - cholecysectomy. - arthroscopy knee w/ drilling. - lithotripsy. - tonsillectomy. - total knee replacement. - trigger finger repair. - wisdom tooth extraction. 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 Respiratory Normal work of breathing on supplemental oxygen. General Notes: 03/02/2022: The left anterior leg wound has deteriorated. His leg is more swollen and red. There is thick slough accumulation on the surface. The right dorsal foot wound is smaller, but also has substantial slough buildup. Integumentary (Hair, Skin) Wound #1 status is Open. Original cause of wound was Blister. The date acquired was: 01/31/2022. The wound has been in treatment 2 weeks. The wound is located on the Right,Dorsal Foot. The wound measures 1.5cm length x 2.5cm width x 0.1cm depth; 2.945cm^2 area and 0.295cm^3 volume. There is Fat Layer (Subcutaneous Tissue) exposed. There is no tunneling or undermining noted. There is [name] medium amount of serosanguineous drainage noted. The wound margin is distinct with the outline attached to the wound base. There is large (67-100%) red granulation within the wound bed. There is [name] small (1-33%) amount of necrotic tissue within the wound bed  including Adherent Slough. The periwound skin appearance had no abnormalities noted for texture. The periwound skin appearance exhibited: Dry/Scaly, Hemosiderin Staining. The periwound skin appearance did not exhibit: Maceration, Erythema. Periwound temperature was noted as No Abnormality. Wound #2 status is Open. Original cause of wound was Blister. The date acquired was: 02/13/2021. The wound has been in treatment 2 weeks. The wound is located on the Right,Anterior Lower Leg. The wound measures 0cm length x 0cm width x 0cm depth; 0cm^2 area and 0cm^3 volume. There is no tunneling or undermining noted. There is [name] none present amount of drainage noted. The wound margin is flat and intact. There is no granulation within the wound bed. There is no necrotic tissue within the wound bed. The periwound skin appearance had no abnormalities noted for texture. The periwound skin appearance exhibited: Dry/Scaly, Hemosiderin Staining. Periwound temperature was noted as No Abnormality. Wound #4 status is Open. Original cause of wound was Blister. The date acquired was: 02/13/2021. The wound has been in treatment 2 weeks. The wound is located on the Left,Anterior Lower Leg. The wound measures 8.5cm length x 7.3cm width x 0.1cm depth; 48.734cm^2 area and 4.873cm^3 volume. There is Fat Layer (Subcutaneous Tissue) exposed. There is no tunneling or undermining noted. There is [name] medium amount of serosanguineous drainage noted. The wound margin is distinct with the outline attached to the wound base. There is medium (34-66%) red, pink granulation within the wound bed. There is [name] medium (34-66%) amount of necrotic tissue within the  wound bed including Adherent Slough. The periwound skin appearance had no abnormalities noted for texture. The periwound skin appearance exhibited: Hemosiderin Staining, Erythema. The periwound skin appearance did not exhibit: Dry/Scaly. The surrounding wound skin color is noted with erythema.  Periwound temperature was noted as No Abnormality. 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 There was [name] Excisional Skin/Subcutaneous Tissue Debridement with [name] total area of 3.75 sq cm performed by [name], MD. With the following instrument(s): Curette to remove Non-Viable tissue/material. Material removed includes Eschar, Subcutaneous Tissue, and Slough after achieving pain control using Lidocaine 4% T opical Solution. No specimens were taken. [name] time out was conducted at 14:51, prior to the start of the procedure. [name] Minimum amount of bleeding was controlled with Pressure. The procedure was tolerated well. Post Debridement Measurements: 1.5cm length x 2.5cm width x 0.1cm depth; 0.295cm^3 volume. Character of Wound/Ulcer Post Debridement is improved. Severity of Tissue Post Debridement is: Fat layer exposed. Post procedure Diagnosis Wound #1: Same as Pre-Procedure General Notes: scribed for Dr. Celine Ahr by Adline Peals, RN. Wound #4 Pre-procedure diagnosis of Wound #4 is [name] Lymphedema located on the Left,Anterior Lower Leg . There was [name] Excisional Skin/Subcutaneous Tissue Debridement with [name] total area of 25.5 sq cm performed by  [name], MD. With the following instrument(s): Curette to remove Non-Viable tissue/material. Material removed includes Subcutaneous Tissue and Slough and after achieving pain control using Lidocaine 4% T opical Solution. 1 specimen was taken by [name] Tissue Culture and sent to the lab per facility protocol. [name] time out was conducted at 14:51, prior to the start of the procedure. [name] Minimum amount of bleeding was controlled with Pressure. The procedure was tolerated well. Post Debridement Measurements: 8.5cm length x 7.3cm width x 0.1cm depth; 4.873cm^3 volume. Character of Wound/Ulcer Post Debridement is improved. Post procedure Diagnosis Wound #4: Same as Pre-Procedure General Notes: scribed for Dr. Celine Ahr by Adline Peals, RN. Plan Follow-up Appointments: Return Appointment in 1 week. - Dr. Celine Ahr - room 2 Anesthetic: (In clinic) Topical Lidocaine 4% applied to wound bed Bathing/ Shower/ Hygiene: May shower with protection but do not get wound dressing(s) wet. - can purchase [name] cast protector from [facility], [facility], [employer] Edema Control - Lymphedema / SCD / Other: Elevate legs to the level of the heart or above for 30 minutes daily and/or when sitting, [name] frequency of: Avoid standing for long periods of time. Exercise regularly The following medication(s) was prescribed: lidocaine topical 4 % cream cream topical was prescribed at facility WOUND #1: - Foot Wound Laterality: Dorsal, Right Cleanser: Soap and Water 1 x Per Week/30 Days Discharge Instructions: May shower and wash wound with dial antibacterial soap and water prior to dressing change. Cleanser: Wound Cleanser 1 x Per Week/30 Days Discharge Instructions: Cleanse the wound with wound cleanser prior to applying [name] clean dressing using gauze sponges, not tissue  or cotton balls. Peri-Wound Care: Sween Lotion (Moisturizing lotion) 1 x Per Week/30 Days Discharge Instructions: Apply moisturizing lotion as directed Prim Dressing:  Sorbalgon AG Dressing 2x2 (in/in) 1 x Per Week/30 Days ary Discharge Instructions: Apply to wound bed as instructed Secondary Dressing: Woven Gauze Sponge, Non-Sterile 4x4 in 1 x Per Week/30 Days Discharge Instructions: Apply over primary dressing as directed. Secondary Dressing: Zetuvit Plus 4x8 in 1 x Per Week/30 Days Discharge Instructions: Apply over primary dressing as directed. Secured With: Transpore Surgical T ape, 2x10 (in/yd) 1 x Per Week/30 Days Discharge Instructions: Secure dressing with tape as directed. WOUND #4: - Lower Leg Wound Laterality: Left, Anterior Cleanser: Soap and Water 1 x Per Week/30 Days Discharge Instructions: May shower and wash wound with dial antibacterial soap and water prior to dressing change. Cleanser: Wound Cleanser 1 x Per Week/30 Days Discharge Instructions: Cleanse the wound with wound cleanser prior to applying [name] clean dressing using gauze sponges, not tissue or cotton balls. Peri-Wound Care: Sween Lotion (Moisturizing lotion) 1 x Per Week/30 Days Discharge Instructions: Apply moisturizing lotion as directed Topical: Mupirocin Ointment 1 x Per Week/30 Days Discharge Instructions: Apply Mupirocin (Bactroban) as instructed Prim Dressing: Sorbalgon AG Dressing, 4x4 (in/in) 1 x Per Week/30 Days ary Discharge Instructions: Apply to wound bed as instructed Secondary Dressing: ABD Pad, 5x9 1 x Per Week/30 Days Discharge Instructions: Apply over primary dressing as directed. Secondary Dressing: Woven Gauze Sponge, Non-Sterile 4x4 in 1 x Per Week/30 Days Discharge Instructions: Apply over primary dressing as directed. Secured With: Transpore Surgical T ape, 2x10 (in/yd) 1 x Per Week/30 Days [name], [name] ([id number]) [phone].pdf Page 9 of 11 Discharge Instructions: Secure dressing with tape as directed. Compression Wrap: ThreePress (3 layer compression wrap) 1 x Per Week/30 Days Discharge Instructions: Apply three layer  compression as directed. Compression Stockings: Jobst Farrow Wrap 4000 Compression Amount: 30-40 mmHg (left) Discharge Instructions: Apply Francia Greaves daily as instructed. Apply first thing in the morning, remove at night before bed. 03/02/2022: The left anterior leg wound has deteriorated. His leg is more swollen and red. There is thick slough accumulation on the surface. The right dorsal foot wound is smaller, but also has substantial slough buildup. I used [name] curette to debride slough, eschar, and nonviable subcutaneous tissue from the right dorsal foot wound. I debrided slough and nonviable subcutaneous tissue from the left anterior leg wound. Due to its worsened appearance and the increase in periwound edema and erythema, I also took [name] culture. I will await the results before prescribing [name] systemic antibiotic, but we will apply topical mupirocin to the wound bed followed by silver alginate and 4-layer compression. Silver alginate alone and 4-layer compression on the right. He will follow-up in 1 week. Electronic Signature(s) Signed: 03/02/2022 3:01:37 PM By: [name] MD FACS Entered By: [name] on 03/02/2022 15:01:36 -------------------------------------------------------------------------------- HxROS Details Patient Name: Date of Service: [name], [name]. 03/02/2022 2:00 PM Medical Record Number: [id number] Patient Account Number: [id number] Date of Birth/Sex: Treating RN: 08/17/1945 (76 y.o. M) Primary Care Provider: [name] Other Clinician: Referring Provider: Treating Provider/Extender: [name] in Treatment: 2 Information Obtained From Patient Constitutional Symptoms (General Health) Medical History: Past Medical History Notes: obsesity Hematologic/Lymphatic Medical History: Positive for: Anemia; Lymphedema Past Medical History Notes: chronic anticoagulation Respiratory Medical History: Positive for: Sleep  Apnea Cardiovascular Medical History: Positive for: Arrhythmia - PAF; Congestive Heart Failure - diastolic; Hypertension Past Medical History Notes: pericarditis Gastrointestinal Medical History: Past Medical  History Notes: pt says 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 but they could not prove it Endocrine Medical History: Positive for: Type II Diabetes - takes no meds, "borderline A1c" Butchko, Weyman [name] ([id number]) [id number] Page 10 of 11 Time with diabetes: 2-3 years ago Blood sugar tested every day: No Genitourinary Medical History: Past Medical History Notes: BPH Neurologic Medical History: Positive for: Neuropathy Past Medical History Notes: 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, embolic cerebral infarction, CVA Oncologic Medical History: Negative for: Received Chemotherapy; Received Radiation Psychiatric Medical History: Past Medical History Notes: depression Immunizations Pneumococcal Vaccine: Received Pneumococcal Vaccination: Yes Received Pneumococcal Vaccination On or After 60th Birthday: Yes Implantable Devices None Hospitalization / Surgery History Type of Hospitalization/Surgery right and left heart cath colonoscopy esophagogastroduodenoscopy subxyphoid pericardial window intraoperative transesophageal echocardiogram laparoscopic gastric banding with hiatal hernia repair breath tek h pylori cholecysectomy arthroscopy knee w/ drilling lithotripsy tonsillectomy total knee replacement trigger finger repair wisdom tooth extraction Family and Social History Cancer: Yes - Paternal Grandparents; Diabetes: No; Heart Disease: Yes - Maternal Grandparents,Father; Hereditary Spherocytosis: No; Hypertension: No; Kidney Disease: No; Lung Disease: No; Seizures: No; Stroke: No; Thyroid Problems: No; Tuberculosis: No; Former smoker - smoked as [name] teen; Marital Status - Married; Alcohol Use: Never; Drug Use: No History; Caffeine Use:  Never; Financial Concerns: No; Food, Clothing or Shelter Needs: No; Support System Lacking: No; Transportation Concerns: No Electronic Signature(s) Signed: 03/02/2022 3:04:46 PM By: [name] MD FACS Entered By: [name] on 03/02/2022 14:59:23 -------------------------------------------------------------------------------- SuperBill Details Patient Name: Date of Service: [name], Taji [name]. 03/02/2022 Medical Record Number: [id number] Patient Account Number: [id number] Date of Birth/Sex: Treating RN: 07-May-1945 (76 y.o. M) Primary Care Provider: [name] Other Clinician: [name] ([id number]) [id number] Page 11 of 11 Referring Provider: Treating Provider/Extender: [name] in Treatment: 2 Diagnosis Coding ICD-10 Codes Code Description [phone] Non-pressure chronic ulcer of other part of right foot with fat layer exposed L97.812 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d L97.822 Non-pressure chronic ulcer of other part of left lower leg with fat layer exposed I50.30 Unspecified diastolic (congestive) heart failure J96.11 Chronic respiratory failure with hypoxia L03.115 Cellulitis of right lower limb E11.621 Type 2 diabetes mellitus with foot ulcer E66.01 Morbid (severe) obesity due to excess calories I89.0 Lymphedema, not elsewhere classified Facility Procedures : 3 CPT4 Code: 9024097 Description: 11042 - DEB SUBQ TISSUE 20 SQ CM/< ICD-10 Diagnosis Description L97.512 Non-pressure chronic ulcer of other part of right foot with fat layer exposed L97.822 Non-pressure chronic ulcer of other part of left lower leg with fat layer expo Modifier: sed Quantity: 1 : 3 CPT4 Code: [id number] Description: 11045 - DEB SUBQ TISS EA ADDL 20CM ICD-10 Diagnosis Description L97.512 Non-pressure chronic ulcer of other part of right foot with fat layer exposed L97.822  Non-pressure chronic ulcer of other part of left lower leg with fat layer expo Modifier: sed Quantity: 1 Physician Procedures : CPT4 Code Description Modifier [id number] - WC PHYS LEVEL 4 - EST PT 25 ICD-10 Diagnosis Description L97.512 Non-pressure chronic ulcer of other part of right foot with fat layer exposed L97.822 Non-pressure chronic ulcer of other part of left lower  leg with fat layer exposed I50.30 Unspecified diastolic (congestive) heart failure I89.0 Lymphedema, not elsewhere classified Quantity: 1 : [id number] 11042 - WC PHYS SUBQ TISS 20 SQ CM ICD-10 Diagnosis Description L97.512 Non-pressure chronic ulcer of other part of right foot with fat layer exposed L89.211  Non-pressure chronic ulcer of other part of left lower leg with fat layer exposed Quantity: 1 : [id number] 11045 - WC PHYS SUBQ TISS EA ADDL 20 CM ICD-10 Diagnosis Description L97.512 Non-pressure chronic ulcer of other part of right foot with fat layer exposed L97.822 Non-pressure chronic ulcer of other part of left lower leg with fat layer exposed Quantity: 1 Electronic Signature(s) Signed: 03/02/2022 3:02:13 PM By: [name] MD FACS Entered By: [name] on 03/02/2022 15:02:12

## 2022-03-03 NOTE — Progress Notes (Signed)
Roussin, [name] ([id number]) [id number] Page 1 of 9 Visit Report for 03/02/2022 Arrival Information Details Patient Name: Date of Service: [name], [name]. 03/02/2022 2:00 PM Medical Record Number: [id number] Patient Account Number: [id number] Date of Birth/Sex: Treating RN: 1945/07/31 (76 y.o. [name] Primary Care [name]: [name] Other Clinician: Referring [name]: Treating [name]/Extender: [name] in Treatment: 2 Visit Information History Since Last Visit Added or deleted any medications: No Patient Arrived: Other Any new allergies or adverse reactions: No Arrival Time: 14:24 Had [name] fall or experienced change in No Accompanied By: self activities of daily living that may affect Transfer Assistance: None risk of falls: Patient Identification Verified: Yes Signs or symptoms of abuse/neglect since last visito No Secondary Verification Process Completed: Yes Hospitalized since last visit: No Patient Requires Transmission-Based Precautions: No Implantable device outside of the clinic excluding No Patient Has Alerts: Yes cellular tissue based products placed in the center Patient Alerts: Patient on Blood Thinner since last visit: Has Dressing in Place as Prescribed: Yes Has Compression in Place as Prescribed: Yes Pain Present Now: No Electronic Signature(s) Signed: 03/02/2022 5:21:53 PM By: Adline Peals Entered By: Adline Peals on 03/02/2022 14:25:07 -------------------------------------------------------------------------------- Encounter Discharge Information Details Patient Name: Date of Service: [name], [name]. 03/02/2022 2:00 PM Medical Record Number: [id number] Patient Account Number: [id number] Date of Birth/Sex: Treating RN: Mar 21, 1946 (76 y.o. [name] Primary Care Eliaz Fout: [name] Other Clinician: Referring Mekenzie Modeste: Treating Diem Pagnotta/Extender:  [name] in Treatment: 2 Encounter Discharge Information Items Post Procedure Vitals Discharge Condition: Stable Temperature (F): 98.3 Ambulatory Status: Other Pulse (bpm): 75 Discharge Destination: Home Respiratory Rate (breaths/min): 18 Transportation: Private Auto Blood Pressure (mmHg): 127/65 Accompanied By: self Schedule Follow-up Appointment: Yes Clinical Summary of Care: Patient Declined Electronic Signature(s) Signed: 03/02/2022 5:21:53 PM By: Adline Peals Entered By: Adline Peals on 03/02/2022 15:20:39 [name] ([id number]) [id number] Page 2 of 9 -------------------------------------------------------------------------------- Lower Extremity Assessment Details Patient Name: Date of Service: [name], [name]. 03/02/2022 2:00 PM Medical Record Number: [id number] Patient Account Number: [id number] Date of Birth/Sex: Treating RN: [birth year] (76 y.o. [name] Primary Care [name]: [name] Other Clinician: Referring Jasiya Markie: Treating Wyatt Thorstenson/Extender: [name] in Treatment: 2 Edema Assessment Assessed: [name]: No] [name]: No] [Left: Edema] [Right: :] Calf Left: Right: Point of Measurement: From Medial Instep 50.5 cm 53 cm Ankle Left: Right: Point of Measurement: From Medial Instep 30.5 cm 30.6 cm Vascular Assessment Pulses: Dorsalis Pedis Palpable: [Left:Yes] [Right:Yes] Electronic Signature(s) Signed: 03/02/2022 5:21:53 PM By: Adline Peals Entered By: Adline Peals on 03/02/2022 14:39:37 -------------------------------------------------------------------------------- [place] Details Patient Name: Date of Service: [name], [name]. 03/02/2022 2:00 PM Medical Record Number: [id number] Patient Account Number: [id number] Date of Birth/Sex: Treating RN: [birth year] (76 y.o. [name] Primary  Care [name]: [name] Other Clinician: Referring [name]: Treating [name]/Extender: [name] in Treatment: 2 Active Inactive Necrotic Tissue Nursing Diagnoses: Impaired tissue integrity related to necrotic/devitalized tissue Knowledge deficit related to management of necrotic/devitalized tissue Goals: Necrotic/devitalized tissue will be minimized in the wound bed Date Initiated: 02/13/2022 Target Resolution Date: 04/07/2022 Goal Status: Active Patient/caregiver will verbalize understanding of reason and process for debridement of necrotic tissue Date Initiated: 02/13/2022 Target Resolution Date: 04/07/2022 Goal Status: Active [name], [name] ([id number]) [id number] Page 3 of 9 Interventions: Assess patient pain level pre-, during and post procedure and prior to discharge Provide education on necrotic  tissue and debridement process Treatment Activities: Apply topical anesthetic as ordered : 02/13/2022 Notes: Wound/Skin Impairment Nursing Diagnoses: Impaired tissue integrity Knowledge deficit related to ulceration/compromised skin integrity Goals: Patient/caregiver will verbalize understanding of skin care regimen Date Initiated: 02/13/2022 Target Resolution Date: 04/07/2022 Goal Status: Active Interventions: Assess ulceration(s) every visit Treatment Activities: Skin care regimen initiated : 02/13/2022 Topical wound management initiated : 02/13/2022 Notes: Electronic Signature(s) Signed: 03/02/2022 5:21:53 PM By: Adline Peals Entered By: Adline Peals on 03/02/2022 15:19:48 -------------------------------------------------------------------------------- Pain Assessment Details Patient Name: Date of Service: [name], [name]. 03/02/2022 2:00 PM Medical Record Number: [id number] Patient Account Number: [id number] Date of Birth/Sex: Treating RN: 1946/02/11 (76 y.o. [name] Primary  Care Joeanthony Seeling: [name] Other Clinician: Referring Johnye Kist: Treating Maie Kesinger/Extender: [name] in Treatment: 2 Active Problems Location of Pain Severity and Description of Pain Patient Has Paino No Site Locations Rate the pain. Current Pain Level: 0 Pain Management and Medication Current Pain Management: Lori, Liew Toriano [name] ([id number]) [phone].pdf Page 4 of 9 Electronic Signature(s) Signed: 03/02/2022 5:21:53 PM By: Sabas Sous By: Adline Peals on 03/02/2022 14:25:17 -------------------------------------------------------------------------------- Patient/Caregiver Education Details Patient Name: Date of Service: [name], [name] 11/30/2023andnbsp2:00 PM Medical Record Number: [id number] Patient Account Number: [id number] Date of Birth/Gender: Treating RN: 1945/09/17 (76 y.o. [name] Primary Care Physician: [name] Other Clinician: Referring Physician: Treating Physician/Extender: [name] in Treatment: 2 Education Assessment Education Provided To: Patient Education Topics Provided Wound/Skin Impairment: Methods: Explain/Verbal Responses: Reinforcements needed, State content correctly Electronic Signature(s) Signed: 03/02/2022 5:21:53 PM By: Adline Peals Entered By: Adline Peals on 03/02/2022 15:20:00 -------------------------------------------------------------------------------- Wound Assessment Details Patient Name: Date of Service: [name], [name]. 03/02/2022 2:00 PM Medical Record Number: [id number] Patient Account Number: [id number] Date of Birth/Sex: Treating RN: 1946-01-14 (76 y.o. [name] Primary Care Neal Oshea: [name] Other Clinician: Referring Sue Mcalexander: Treating Elisha Mcgruder/Extender: [name] in Treatment: 2 Wound Status Wound Number: 1 Primary Diabetic Wound/Ulcer  of the Lower Extremity Etiology: Wound Location: Right, Dorsal Foot Wound Open Wounding Event: Blister Status: Date Acquired: 01/31/2022 Comorbid Anemia, Lymphedema, Sleep Apnea, Arrhythmia, Congestive Weeks Of Treatment: 2 History: Heart Failure, Hypertension, Type II Diabetes, Neuropathy Clustered Wound: No Photos Sauser, Kevis [name] ([id number]) [id number] Page 5 of 9 Wound Measurements Length: (cm) 1.5 Width: (cm) 2.5 Depth: (cm) 0.1 Area: (cm) 2.945 Volume: (cm) 0.295 % Reduction in Area: 73.9% % Reduction in Volume: 73.8% Epithelialization: Small (1-33%) Tunneling: No Undermining: No Wound Description Classification: Grade 1 Wound Margin: Distinct, outline attached Exudate Amount: Medium Exudate Type: Serosanguineous Exudate Color: red, brown Foul Odor After Cleansing: No Slough/Fibrino Yes Wound Bed Granulation Amount: Large (67-100%) Exposed Structure Granulation Quality: Red Fascia Exposed: No Necrotic Amount: Small (1-33%) Fat Layer (Subcutaneous Tissue) Exposed: Yes Necrotic Quality: Adherent Slough Tendon Exposed: No Muscle Exposed: No Joint Exposed: No Bone Exposed: No Periwound Skin Texture Texture Color No Abnormalities Noted: Yes No Abnormalities Noted: No Erythema: No Moisture Hemosiderin Staining: Yes No Abnormalities Noted: No Dry / Scaly: Yes Temperature / Pain Maceration: No Temperature: No Abnormality Treatment Notes Wound #1 (Foot) Wound Laterality: Dorsal, Right Cleanser Soap and Water Discharge Instruction: May shower and wash wound with dial antibacterial soap and water prior to dressing change. Wound Cleanser Discharge Instruction: Cleanse the wound with wound cleanser prior to applying [name] clean dressing using gauze sponges, not tissue or cotton balls. Peri-Wound Care Sween Lotion (Moisturizing lotion) Discharge Instruction: Apply moisturizing lotion as  directed Topical Primary Dressing Sorbalgon  AG Dressing 2x2 (in/in) Discharge Instruction: Apply to wound bed as instructed Secondary Dressing Woven Gauze Sponge, Non-Sterile 4x4 in Discharge Instruction: Apply over primary dressing as directed. Zetuvit Plus 4x8 in Discharge Instruction: Apply over primary dressing as directed. Secured With Transpore Surgical Tape, 2x10 (in/yd) Greener, Clemens [name] ([id number]) [phone].pdf Page 6 of 9 Discharge Instruction: Secure dressing with tape as directed. Compression Wrap Compression Stockings Add-Ons Electronic Signature(s) Signed: 03/02/2022 5:21:53 PM By: Adline Peals Entered By: Adline Peals on 03/02/2022 14:43:47 -------------------------------------------------------------------------------- Wound Assessment Details Patient Name: Date of Service: [name], [name]. 03/02/2022 2:00 PM Medical Record Number: [id number] Patient Account Number: [id number] Date of Birth/Sex: Treating RN: Mar 17, 1946 (76 y.o. [name] Primary Care Witten Certain: [name] Other Clinician: Referring Khloee Garza: Treating Ezana Hubbert/Extender: [name] in Treatment: 2 Wound Status Wound Number: 2 Primary Lymphedema Etiology: Wound Location: Right, Anterior Lower Leg Wound Open Wounding Event: Blister Status: Date Acquired: 02/13/2021 Comorbid Anemia, Lymphedema, Sleep Apnea, Arrhythmia, Congestive Weeks Of Treatment: 2 History: Heart Failure, Hypertension, Type II Diabetes, Neuropathy Clustered Wound: No Photos Wound Measurements Length: (cm) Width: (cm) Depth: (cm) Area: (cm) Volume: (cm) 0 % Reduction in Area: 100% 0 % Reduction in Volume: 100% 0 Epithelialization: Large (67-100%) 0 Tunneling: No 0 Undermining: No Wound Description Classification: Full Thickness Without Exposed Support Structures Wound Margin: Flat and Intact Exudate Amount: None Present Foul Odor After Cleansing: No Slough/Fibrino No Wound  Bed Granulation Amount: None Present (0%) Exposed Structure Necrotic Amount: None Present (0%) Fascia Exposed: No Fat Layer (Subcutaneous Tissue) Exposed: No Tendon Exposed: No Muscle Exposed: No Joint Exposed: No Bone Exposed: No Periwound Skin Texture Texture Color Pandit, Choua [name] ([id number]) [id number] Page 7 of 9 No Abnormalities Noted: Yes No Abnormalities Noted: No Hemosiderin Staining: Yes Moisture No Abnormalities Noted: No Temperature / Pain Dry / Scaly: Yes Temperature: No Abnormality Electronic Signature(s) Signed: 03/02/2022 5:21:53 PM By: Adline Peals Entered By: Adline Peals on 03/02/2022 14:44:09 -------------------------------------------------------------------------------- Wound Assessment Details Patient Name: Date of Service: [name], [name]. 03/02/2022 2:00 PM Medical Record Number: [id number] Patient Account Number: [id number] Date of Birth/Sex: Treating RN: 1945-10-22 (76 y.o. [name] Primary Care Penina Reisner: [name] Other Clinician: Referring Harshita Bernales: Treating Nicolas Sisler/Extender: [name] in Treatment: 2 Wound Status Wound Number: 4 Primary Lymphedema Etiology: Wound Location: Left, Anterior Lower Leg Wound Open Wounding Event: Blister Status: Date Acquired: 02/13/2021 Comorbid Anemia, Lymphedema, Sleep Apnea, Arrhythmia, Congestive Weeks Of Treatment: 2 History: Heart Failure, Hypertension, Type II Diabetes, Neuropathy Clustered Wound: No Photos Wound Measurements Length: (cm) 8.5 Width: (cm) 7.3 Depth: (cm) 0.1 Area: (cm) 48.734 Volume: (cm) 4.873 % Reduction in Area: -490.9% % Reduction in Volume: -490.7% Epithelialization: None Tunneling: No Undermining: No Wound Description Classification: Full Thickness Without Exposed Suppor Wound Margin: Distinct, outline attached Exudate Amount: Medium Exudate Type: Serosanguineous Exudate Color:  red, brown t Structures Foul Odor After Cleansing: No Slough/Fibrino Yes Wound Bed Granulation Amount: Medium (34-66%) Exposed Structure Granulation Quality: Red, Pink Fascia Exposed: No Necrotic Amount: Medium (34-66%) Fat Layer (Subcutaneous Tissue) Exposed: Yes Necrotic Quality: Adherent Slough Tendon Exposed: No Muscle Exposed: No Joint Exposed: No Bone Exposed: No Periwound Skin Texture Texture Color Cutillo, Karandeep [name] ([id number]) [id number] Page 8 of 9 No Abnormalities Noted: Yes No Abnormalities Noted: No Erythema: Yes Moisture Hemosiderin Staining: Yes No Abnormalities Noted: No Dry / Scaly: No Temperature / Pain Temperature: No Abnormality Treatment Notes Wound #4 (Lower Leg) Wound  Laterality: Left, Anterior Cleanser Soap and Water Discharge Instruction: May shower and wash wound with dial antibacterial soap and water prior to dressing change. Wound Cleanser Discharge Instruction: Cleanse the wound with wound cleanser prior to applying [name] clean dressing using gauze sponges, not tissue or cotton balls. Peri-Wound Care Sween Lotion (Moisturizing lotion) Discharge Instruction: Apply moisturizing lotion as directed Topical Mupirocin Ointment Discharge Instruction: Apply Mupirocin (Bactroban) as instructed Primary Dressing Sorbalgon AG Dressing, 4x4 (in/in) Discharge Instruction: Apply to wound bed as instructed Secondary Dressing ABD Pad, 5x9 Discharge Instruction: Apply over primary dressing as directed. Woven Gauze Sponge, Non-Sterile 4x4 in Discharge Instruction: Apply over primary dressing as directed. Secured With Transpore Surgical Tape, 2x10 (in/yd) Discharge Instruction: Secure dressing with tape as directed. Compression Wrap ThreePress (3 layer compression wrap) Discharge Instruction: Apply three layer compression as directed. Compression Stockings Jobst Farrow Wrap 4000 Quantity: 1 Left Leg Compression Amount: 30-40  mmHg Discharge Instruction: Apply Francia Greaves daily as instructed. Apply first thing in the morning, remove at night before bed. Add-Ons Electronic Signature(s) Signed: 03/02/2022 5:21:53 PM By: Sabas Sous By: Adline Peals on 03/02/2022 14:44:37 -------------------------------------------------------------------------------- Vitals Details Patient Name: Date of Service: [name] CKMA [name], Kveon [name]. 03/02/2022 2:00 PM Medical Record Number: [id number] Patient Account Number: [id number] Date of Birth/Sex: Treating RN: 1945-11-24 (76 y.o. [name] Primary Care Aaro Meyers: [name] Other Clinician: Referring Adolphus Hanf: Treating Hyrum Shaneyfelt/Extender: [name] in Treatment: 2 Vital Signs Loma Newton, Mrk [name] ([id number]) [id number] Page 9 of 9 Time Taken: 14:25 Temperature (F): 98.3 Height (in): 72 Pulse (bpm): 75 Weight (lbs): 375 Respiratory Rate (breaths/min): 18 Body Mass Index (BMI): 50.9 Blood Pressure (mmHg): 127/65 Reference Range: 80 - 120 mg / dl Electronic Signature(s) Signed: 03/02/2022 5:21:53 PM By: Adline Peals Entered By: Adline Peals on 03/02/2022 14:26:40

## 2022-03-06 ENCOUNTER — Other Ambulatory Visit (HOSPITAL_BASED_OUTPATIENT_CLINIC_OR_DEPARTMENT_OTHER): Payer: Self-pay | Admitting: General Surgery

## 2022-03-09 ENCOUNTER — Encounter (HOSPITAL_BASED_OUTPATIENT_CLINIC_OR_DEPARTMENT_OTHER): Payer: Medicare Other | Attending: General Surgery | Admitting: General Surgery

## 2022-03-09 DIAGNOSIS — J9611 Chronic respiratory failure with hypoxia: Secondary | ICD-10-CM | POA: Diagnosis not present

## 2022-03-09 DIAGNOSIS — L97822 Non-pressure chronic ulcer of other part of left lower leg with fat layer exposed: Secondary | ICD-10-CM | POA: Insufficient documentation

## 2022-03-09 DIAGNOSIS — I890 Lymphedema, not elsewhere classified: Secondary | ICD-10-CM | POA: Insufficient documentation

## 2022-03-09 DIAGNOSIS — E1140 Type 2 diabetes mellitus with diabetic neuropathy, unspecified: Secondary | ICD-10-CM | POA: Insufficient documentation

## 2022-03-09 DIAGNOSIS — I110 Hypertensive heart disease with heart failure: Secondary | ICD-10-CM | POA: Diagnosis not present

## 2022-03-09 DIAGNOSIS — L97812 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d: Secondary | ICD-10-CM | POA: Insufficient documentation

## 2022-03-09 DIAGNOSIS — Z7901 Long term (current) use of anticoagulants: Secondary | ICD-10-CM | POA: Diagnosis not present

## 2022-03-09 DIAGNOSIS — L97512 Non-pressure chronic ulcer of other part of right foot with fat layer exposed: Secondary | ICD-10-CM | POA: Insufficient documentation

## 2022-03-09 DIAGNOSIS — I272 Pulmonary hypertension, unspecified: Secondary | ICD-10-CM | POA: Insufficient documentation

## 2022-03-09 DIAGNOSIS — I480 Paroxysmal atrial fibrillation: Secondary | ICD-10-CM | POA: Diagnosis not present

## 2022-03-09 DIAGNOSIS — E11621 Type 2 diabetes mellitus with foot ulcer: Secondary | ICD-10-CM | POA: Insufficient documentation

## 2022-03-09 DIAGNOSIS — L03115 Cellulitis of right lower limb: Secondary | ICD-10-CM | POA: Insufficient documentation

## 2022-03-09 DIAGNOSIS — I5032 Chronic diastolic (congestive) heart failure: Secondary | ICD-10-CM | POA: Diagnosis not present

## 2022-03-10 NOTE — Progress Notes (Signed)
[name] ([id number]) [id number] Page 1 of 11 Visit Report for 03/09/2022 Chief Complaint Document Details Patient Name: Date of Service: [place]. 03/09/2022 3:00 PM Medical Record Number: [id number] Patient Account Number: [id number] Date of Birth/Sex: Treating RN: 07-Nov-1945 (76 y.o. M) Primary Care Provider: [name] Other Clinician: Referring Provider: Treating Provider/Extender: [name] in Treatment: 3 Information Obtained from: Patient Chief Complaint Patients presents for treatment of open ulcers secondary to lymphedema and diabetes Electronic Signature(s) Signed: 03/09/2022 3:39:34 PM By: [name] MD FACS Entered By: [name] on 03/09/2022 15:39:34 -------------------------------------------------------------------------------- Debridement Details Patient Name: Date of Service: [name], Ashraf [name]. 03/09/2022 3:00 PM Medical Record Number: [id number] Patient Account Number: [id number] Date of Birth/Sex: Treating RN: [birth year] (76 y.o. [name] Primary Care Provider: [name] Other Clinician: Referring Provider: Treating Provider/Extender: [name] in Treatment: 3 Debridement Performed for Assessment: Wound #4 Left,Anterior Lower Leg Performed By: Physician [name], MD Debridement Type: Debridement Level of Consciousness (Pre-procedure): Awake and Alert Pre-procedure Verification/Time Out Yes - 15:12 Taken: Start Time: 15:12 Pain Control: Lidocaine 4% T opical Solution T Area Debrided (L x W): otal 6 (cm) x 6 (cm) = 36 (cm) Tissue and other material debrided: Non-Viable, Slough, Slough Level: Non-Viable Tissue Debridement Description: Selective/Open Wound Instrument: Curette Bleeding: Minimum Hemostasis Achieved: Pressure Response to Treatment: Procedure was tolerated well Level of Consciousness (Post- Awake and  Alert procedure): Post Debridement Measurements of Total Wound Length: (cm) 9.5 Width: (cm) 9.5 Depth: (cm) 0.1 Volume: (cm) 7.088 Character of Wound/Ulcer Post Debridement: Improved Post Procedure Diagnosis Same as Pre-procedure [name], Gearl [name] ([id number]) [id number] Page 2 of 11 Notes Scribed for Dr. Celine Ahr by Adline Peals, RN Electronic Signature(s) Signed: 03/09/2022 3:43:49 PM By: [name] MD FACS Signed: 03/09/2022 3:45:47 PM By: Sabas Sous By: Adline Peals on 03/09/2022 15:14:02 -------------------------------------------------------------------------------- Debridement Details Patient Name: Date of Service: [name], [name] 03/09/2022 3:00 PM Medical Record Number: [id number] Patient Account Number: [id number] Date of Birth/Sex: Treating RN: 29-Jan-1946 (76 y.o. [name] Primary Care Provider: [name] Other Clinician: Referring Provider: Treating Provider/Extender: [name] in Treatment: 3 Debridement Performed for Assessment: Wound #1 Right,Dorsal Foot Performed By: Physician [name], MD Debridement Type: Debridement Severity of Tissue Pre Debridement: Fat layer exposed Level of Consciousness (Pre-procedure): Awake and Alert Pre-procedure Verification/Time Out Yes - 15:12 Taken: Start Time: 15:12 Pain Control: Lidocaine 4% T opical Solution T Area Debrided (L x W): otal 1.5 (cm) x 2.2 (cm) = 3.3 (cm) Tissue and other material debrided: Non-Viable, Slough, Subcutaneous, Slough Level: Skin/Subcutaneous Tissue Debridement Description: Excisional Instrument: Curette Bleeding: Minimum Hemostasis Achieved: Pressure Response to Treatment: Procedure was tolerated well Level of Consciousness (Post- Awake and Alert procedure): Post Debridement Measurements of Total Wound Length: (cm) 1.5 Width: (cm) 2.2 Depth: (cm) 0.1 Volume: (cm)  0.259 Character of Wound/Ulcer Post Debridement: Improved Severity of Tissue Post Debridement: Fat layer exposed Post Procedure Diagnosis Same as Pre-procedure Notes scribed for Dr. Celine Ahr by Adline Peals, RN Electronic Signature(s) Signed: 03/09/2022 3:43:49 PM By: [name] MD FACS Signed: 03/09/2022 3:45:47 PM By: Adline Peals Entered By: Adline Peals on 03/09/2022 15:16:43 -------------------------------------------------------------------------------- HPI Details Patient Name: Date of Service: [name], [name] 03/09/2022 3:00 PM Medical Record Number: [id number] Patient Account Number: [id number] [name], Ullman Derin [name] ([id number]) [id number] Page 3 of 11 Date of Birth/Sex: Treating RN: [birth year] (76 y.o. M) Primary Care Provider: [name] Other  Clinician: Referring Provider: Treating Provider/Extender: [name] in Treatment: 3 History of Present Illness HPI Description: ADMISSION 02/13/2022 This is [name] 76 year old morbidly obese type II diabetic (last hemoglobin A1c 6.6%) who presented to his primary care provider's office on November 7 with an ulcer on the top of his foot. He said it had been there for about [name] week. He is insensate in his feet and his wife had been caring for it. His PCP took [name] swab from the wound which grew back strep. [name] was prescribed and he was referred to the wound care center for further evaluation and management. In addition to diabetes, he also has pulmonary hypertension and diastolic congestive heart failure. 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 He has been seen at the voice and swallowing disorders clinic in [place] and no further intervention has been felt necessary. On exam, he has severe, at least stage II, lymphedema. In addition to the dorsal foot wound, he has ulcers on both lower legs. He has [name] left  anterior lower leg, [name] right lateral lower leg, and [name] right anterior lower leg wound. ABIs in clinic were noncompressible. 02/22/2022: The right lateral leg wound has closed. The other wounds are about the same size to slightly smaller. All of them have [name] thick layer of rubbery slough on the surface. Edema control is still suboptimal. 03/02/2022: The left anterior leg wound has deteriorated. His leg is more swollen and red. There is thick slough accumulation on the surface. The right dorsal foot wound is smaller, but also has substantial slough buildup. 03/09/2022: The PCR culture that I took last week was positive for Pseudomonas. Levofloxacin was prescribed and he has [name] few days left of this. His leg looks better and the wound is cleaner. The right dorsal foot wound has [name] very thick layer of slough on the surface. Both wounds are smaller. Electronic Signature(s) Signed: 03/09/2022 3:40:33 PM By: [name] MD FACS Entered By: [name] on 03/09/2022 15:40:33 -------------------------------------------------------------------------------- Physical Exam Details Patient Name: Date of Service: [place] 03/09/2022 3:00 PM Medical Record Number: [id number] Patient Account Number: [id number] Date of Birth/Sex: Treating RN: 09-05-1945 (76 y.o. M) Primary Care Provider: [name] Other Clinician: Referring Provider: Treating Provider/Extender: [name] in Treatment: 3 Constitutional . . . . No acute distress. Respiratory Normal work of breathing on supplemental oxygen. Notes 03/09/2022: His leg looks better and the wound is cleaner. The right dorsal foot wound has [name] very thick layer of slough on the surface. Both wounds are smaller. Electronic Signature(s) Signed: 03/09/2022 3:41:19 PM By: [name] MD FACS Entered By: [name] on 03/09/2022  15:41:19 -------------------------------------------------------------------------------- Physician Orders Details Patient Name: Date of Service: [place], Karman [name]. 03/09/2022 3:00 PM Medical Record Number: [id number] Patient Account Number: [id number] Date of Birth/Sex: Treating RN: Mar 24, 1946 (76 y.o. [name] Primary Care Provider: [name] Other Clinician: Referring Provider: Treating Provider/Extender: Eyvonne Mechanic, [place] ([id number]) [phone].pdf Page 4 of 11 Weeks in Treatment: 3 Verbal / Phone Orders: No Diagnosis Coding ICD-10 Coding Code Description L97.512 Non-pressure chronic ulcer of other part of right foot with fat layer exposed L97.812 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d L97.822 Non-pressure chronic ulcer of other part of left lower leg with fat layer exposed I50.30 Unspecified diastolic (congestive) heart failure J96.11 Chronic respiratory failure  with hypoxia L03.115 Cellulitis of right lower limb E11.621 Type 2 diabetes mellitus with foot ulcer E66.01 Morbid (severe) obesity due to excess calories I89.0 Lymphedema, not elsewhere classified Follow-up Appointments ppointment in 1 week. - Dr. Celine Ahr - room 2 Return [name] Anesthetic (In clinic) Topical Lidocaine 4% applied to wound bed Bathing/ Shower/ Hygiene May shower with protection but do not get wound dressing(s) wet. - can purchase [name] cast protector from [facility], [facility], [facility] Edema Control - Lymphedema / SCD / Other Elevate legs to the level of the heart or above for 30 minutes daily and/or when sitting, [name] frequency of: Avoid standing for long periods of time. Exercise regularly Wound Treatment Wound #1 - Foot Wound Laterality: Dorsal, Right Cleanser: Soap and Water 1 x Per Week/30 Days Discharge Instructions: May shower and wash wound with dial antibacterial soap and water prior to  dressing change. Cleanser: Wound Cleanser 1 x Per Week/30 Days Discharge Instructions: Cleanse the wound with wound cleanser prior to applying [name] clean dressing using gauze sponges, not tissue or cotton balls. Peri-Wound Care: Sween Lotion (Moisturizing lotion) 1 x Per Week/30 Days Discharge Instructions: Apply moisturizing lotion as directed Prim Dressing: Sorbalgon AG Dressing 2x2 (in/in) 1 x Per Week/30 Days ary Discharge Instructions: Apply to wound bed as instructed Secondary Dressing: Woven Gauze Sponge, Non-Sterile 4x4 in 1 x Per Week/30 Days Discharge Instructions: Apply over primary dressing as directed. Secondary Dressing: Zetuvit Plus 4x8 in 1 x Per Week/30 Days Discharge Instructions: Apply over primary dressing as directed. Secured With: Transpore Surgical Tape, 2x10 (in/yd) 1 x Per Week/30 Days Discharge Instructions: Secure dressing with tape as directed. Wound #4 - Lower Leg Wound Laterality: Left, Anterior Cleanser: Soap and Water 1 x Per Week/30 Days Discharge Instructions: May shower and wash wound with dial antibacterial soap and water prior to dressing change. Cleanser: Wound Cleanser 1 x Per Week/30 Days Discharge Instructions: Cleanse the wound with wound cleanser prior to applying [name] clean dressing using gauze sponges, not tissue or cotton balls. Peri-Wound Care: Sween Lotion (Moisturizing lotion) 1 x Per Week/30 Days Discharge Instructions: Apply moisturizing lotion as directed Topical: Mupirocin Ointment 1 x Per Week/30 Days Discharge Instructions: Apply Mupirocin (Bactroban) as instructed Prim Dressing: Sorbalgon AG Dressing, 4x4 (in/in) 1 x Per Week/30 Days ary Discharge Instructions: Apply to wound bed as instructed Secondary Dressing: Woven Gauze Sponge, Non-Sterile 4x4 in 1 x Per Week/30 Days Discharge Instructions: Apply over primary dressing as directed. Secondary Dressing: Zetuvit Plus 4x8 in 1 x Per Week/30 Days Prospero, Rourke [name] ([id number])  [id number] Page 5 of 11 Discharge Instructions: Apply over primary dressing as directed. Secured With: Transpore Surgical Tape, 2x10 (in/yd) 1 x Per Week/30 Days Discharge Instructions: Secure dressing with tape as directed. Compression Wrap: ThreePress (3 layer compression wrap) 1 x Per Week/30 Days Discharge Instructions: Apply three layer compression as directed. Compression Stockings: Jobst Farrow Wrap 4000 Left Leg Compression Amount: 30-40 mmHG Discharge Instructions: Apply Francia Greaves daily as instructed. Apply first thing in the morning, remove at night before bed. Patient Medications llergies: morphine, penicillin, codeine, propoxyphene, Darvocet-N [name] Notifications Medication Indication Start End 03/09/2022 lidocaine DOSE topical 4 % cream - cream topical Electronic Signature(s) Signed: 03/09/2022 3:43:49 PM By: [name] MD FACS Entered By: [name] on 03/09/2022 15:41:38 -------------------------------------------------------------------------------- Problem List Details Patient Name: Date of Service: [place], [place]. 03/09/2022 3:00 PM Medical Record Number: [id number] Patient Account Number: [id number] Date of Birth/Sex: Treating RN: [birth year] (76 y.o. M) Primary Care Provider: Ronnald Ramp,  Marcello Moores Other Clinician: Referring Provider: Treating Provider/Extender: [name] in Treatment: 3 Active Problems ICD-10 Encounter Code Description Active Date MDM Diagnosis L97.512 Non-pressure chronic ulcer of other part of right foot with fat layer exposed 02/13/2022 No Yes L97.812 Non-pressure chronic ulcer of other part of right lower leg with fat layer 02/13/2022 No Yes exposed L97.822 Non-pressure chronic ulcer of other part of left lower leg with fat layer exposed11/13/2023 No Yes I50.30 Unspecified diastolic (congestive) heart failure 02/13/2022 No Yes J96.11 Chronic respiratory failure with hypoxia  02/13/2022 No Yes L03.115 Cellulitis of right lower limb 02/13/2022 No Yes E11.621 Type 2 diabetes mellitus with foot ulcer 02/13/2022 No Yes Spiering, Taim [name] ([id number]) [id number] Page 6 of 11 E66.01 Morbid (severe) obesity due to excess calories 02/13/2022 No Yes I89.0 Lymphedema, not elsewhere classified 02/13/2022 No Yes Inactive Problems Resolved Problems Electronic Signature(s) Signed: 03/09/2022 3:39:11 PM By: [name] MD FACS Entered By: [name] on 03/09/2022 15:39:11 -------------------------------------------------------------------------------- Progress Note Details Patient Name: Date of Service: [place] 03/09/2022 3:00 PM Medical Record Number: [id number] Patient Account Number: [id number] Date of Birth/Sex: Treating RN: 1946/01/06 (76 y.o. M) Primary Care Provider: [name] Other Clinician: Referring Provider: Treating Provider/Extender: [name] in Treatment: 3 Subjective Chief Complaint Information obtained from Patient Patients presents for treatment of open ulcers secondary to lymphedema and diabetes History of Present Illness (HPI) ADMISSION 02/13/2022 This is [name] 76 year old morbidly obese type II diabetic (last hemoglobin A1c 6.6%) who presented to his primary care provider's office on November 7 with an ulcer on the top of his foot. He said it had been there for about [name] week. He is insensate in his feet and his wife had been caring for it. His PCP took [name] swab from the wound which grew back strep. [name] was prescribed and he was referred to the wound care center for further evaluation and management. In addition to diabetes, he also has pulmonary hypertension and diastolic congestive heart failure. 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 He has been seen at the voice and swallowing disorders clinic in [place] and no  further intervention has been felt necessary. On exam, he has severe, at least stage II, lymphedema. In addition to the dorsal foot wound, he has ulcers on both lower legs. He has [name] left anterior lower leg, [name] right lateral lower leg, and [name] right anterior lower leg wound. ABIs in clinic were noncompressible. 02/22/2022: The right lateral leg wound has closed. The other wounds are about the same size to slightly smaller. All of them have [name] thick layer of rubbery slough on the surface. Edema control is still suboptimal. 03/02/2022: The left anterior leg wound has deteriorated. His leg is more swollen and red. There is thick slough accumulation on the surface. The right dorsal foot wound is smaller, but also has substantial slough buildup. 03/09/2022: The PCR culture that I took last week was positive for Pseudomonas. Levofloxacin was prescribed and he has [name] few days left of this. His leg looks better and the wound is cleaner. The right dorsal foot wound has [name] very thick layer of slough on the surface. Both wounds are smaller. Patient History Information obtained from Patient. Family History Cancer - Paternal Grandparents, Heart Disease - Maternal Grandparents,Father, No family history of Diabetes, Hereditary Spherocytosis, Hypertension, Kidney Disease, Lung Disease, Seizures, Stroke, Thyroid Problems, Tuberculosis. Social History Former smoker - smoked as  [name] teen, Marital Status - Married, Alcohol Use - Never, Drug Use - No History, Caffeine Use - Never. Medical History Hematologic/Lymphatic Patient has history of Anemia, Lymphedema Respiratory Patient has history of Sleep Apnea Cardiovascular Patient has history of Arrhythmia - PAF, Congestive Heart Failure - diastolic, Hypertension Muto, Clemente [name] ([id number]) [id number] Page 7 of 11 Endocrine Patient has history of Type II Diabetes - takes no meds, "borderline A1c" Neurologic Patient has history of  Neuropathy Oncologic Denies history of Received Chemotherapy, Received Radiation Hospitalization/Surgery History - right and left heart cath. - colonoscopy. - esophagogastroduodenoscopy. - subxyphoid pericardial window. - intraoperative transesophageal echocardiogram. - laparoscopic gastric banding with hiatal hernia repair. - breath tek h pylori. - cholecysectomy. - arthroscopy knee w/ drilling. - lithotripsy. - tonsillectomy. - total knee replacement. - trigger finger repair. - wisdom tooth extraction. Medical [name] Surgical History Notes nd Constitutional Symptoms (General Health) obsesity Hematologic/Lymphatic chronic anticoagulation Cardiovascular pericarditis Gastrointestinal pt says 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 but they could not prove it Genitourinary BPH Neurologic 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, embolic cerebral infarction, CVA Psychiatric depression Objective Constitutional No acute distress. Vitals Time Taken: 2:50 PM, Height: 72 in, Weight: 375 lbs, BMI: 50.9, Temperature: 98.4 F, Pulse: 65 bpm, Respiratory Rate: 20 breaths/min, Blood Pressure: 124/65 mmHg. Respiratory Normal work of breathing on supplemental oxygen. General Notes: 03/09/2022: His leg looks better and the wound is cleaner. The right dorsal foot wound has [name] very thick layer of slough on the surface. Both wounds are smaller. Integumentary (Hair, Skin) Wound #1 status is Open. Original cause of wound was Blister. The date acquired was: 01/31/2022. The wound has been in treatment 3 weeks. The wound is located on the Right,Dorsal Foot. The wound measures 1.5cm length x 2.2cm width x 0.1cm depth; 2.592cm^2 area and 0.259cm^3 volume. There is Fat Layer (Subcutaneous Tissue) exposed. There is no tunneling or undermining noted. There is [name] medium amount of serosanguineous drainage noted. The wound margin is distinct with the outline attached to the wound base. There is medium (34-66%) red  granulation within the wound bed. There is [name] medium (34-66%) amount of necrotic tissue within the wound bed including Adherent Slough. The periwound skin appearance had no abnormalities noted for texture. The periwound skin appearance exhibited: Dry/Scaly, Hemosiderin Staining. The periwound skin appearance did not exhibit: Maceration, Erythema. Periwound temperature was noted as No Abnormality. Wound #4 status is Open. Original cause of wound was Blister. The date acquired was: 02/13/2021. The wound has been in treatment 3 weeks. The wound is located on the Left,Anterior Lower Leg. The wound measures 9.5cm length x 9.5cm width x 0.1cm depth; 70.882cm^2 area and 7.088cm^3 volume. There is Fat Layer (Subcutaneous Tissue) exposed. There is no tunneling or undermining noted. There is [name] medium amount of serosanguineous drainage noted. The wound margin is distinct with the outline attached to the wound base. There is large (67-100%) red, pink granulation within the wound bed. There is [name] small (1-33%) amount of necrotic tissue within the wound bed including Adherent Slough. The periwound skin appearance had no abnormalities noted for texture. The periwound skin appearance exhibited: Hemosiderin Staining, Erythema. The periwound skin appearance did not exhibit: Dry/Scaly. The surrounding wound skin color is noted with erythema. Periwound temperature was noted as No Abnormality. 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 part  of left lower leg with fat layer exposed Unspecified diastolic (congestive) heart failure Chronic respiratory failure with hypoxia Cellulitis of right lower limb Type 2 diabetes mellitus with foot ulcer Morbid (severe) obesity due to excess calories Lymphedema, not elsewhere classified Winget,  Amarien [name] ([id number]) [id number] Page 8 of 11 Procedures Wound #1 Pre-procedure diagnosis of Wound #1 is [name] Diabetic Wound/Ulcer of the Lower Extremity located on the Right,Dorsal Foot .Severity of Tissue Pre Debridement is: Fat layer exposed. There was [name] Excisional Skin/Subcutaneous Tissue Debridement with [name] total area of 3.3 sq cm performed by [name], MD. With the following instrument(s): Curette to remove Non-Viable tissue/material. Material removed includes Subcutaneous Tissue and Slough and after achieving pain control using Lidocaine 4% T opical Solution. No specimens were taken. [name] time out was conducted at 15:12, prior to the start of the procedure. [name] Minimum amount of bleeding was controlled with Pressure. The procedure was tolerated well. Post Debridement Measurements: 1.5cm length x 2.2cm width x 0.1cm depth; 0.259cm^3 volume. Character of Wound/Ulcer Post Debridement is improved. Severity of Tissue Post Debridement is: Fat layer exposed. Post procedure Diagnosis Wound #1: Same as Pre-Procedure General Notes: scribed for Dr. Celine Ahr by Adline Peals, RN. Pre-procedure diagnosis of Wound #1 is [name] Diabetic Wound/Ulcer of the Lower Extremity located on the Right,Dorsal Foot . There was [name] Three Layer Compression Therapy Procedure by Resa Miner, RN. Post procedure Diagnosis Wound #1: Same as Pre-Procedure Wound #4 Pre-procedure diagnosis of Wound #4 is [name] Lymphedema located on the Left,Anterior Lower Leg . There was [name] Selective/Open Wound Non-Viable Tissue Debridement with [name] total area of 36 sq cm performed by [name], MD. With the following instrument(s): Curette to remove Non-Viable tissue/material. Material removed includes [facility] after achieving pain control using Lidocaine 4% Topical Solution. No specimens were taken. [name] time out was conducted at 15:12, prior to the start of the procedure. [name] Minimum amount of bleeding was controlled  with Pressure. The procedure was tolerated well. Post Debridement Measurements: 9.5cm length x 9.5cm width x 0.1cm depth; 7.088cm^3 volume. Character of Wound/Ulcer Post Debridement is improved. Post procedure Diagnosis Wound #4: Same as Pre-Procedure General Notes: Scribed for Dr. Celine Ahr by Adline Peals, RN. Pre-procedure diagnosis of Wound #4 is [name] Lymphedema located on the Left,Anterior Lower Leg . There was [name] Three Layer Compression Therapy Procedure by Adline Peals, RN. Post procedure Diagnosis Wound #4: Same as Pre-Procedure Plan Follow-up Appointments: Return Appointment in 1 week. - Dr. Celine Ahr - room 2 Anesthetic: (In clinic) Topical Lidocaine 4% applied to wound bed Bathing/ Shower/ Hygiene: May shower with protection but do not get wound dressing(s) wet. - can purchase [name] cast protector from [facility], [facility], [facility] Edema Control - Lymphedema / SCD / Other: Elevate legs to the level of the heart or above for 30 minutes daily and/or when sitting, [name] frequency of: Avoid standing for long periods of time. Exercise regularly The following medication(s) was prescribed: lidocaine topical 4 % cream cream topical was prescribed at facility WOUND #1: - Foot Wound Laterality: Dorsal, Right Cleanser: Soap and Water 1 x Per Week/30 Days Discharge Instructions: May shower and wash wound with dial antibacterial soap and water prior to dressing change. Cleanser: Wound Cleanser 1 x Per Week/30 Days Discharge Instructions: Cleanse the wound with wound cleanser prior to applying [name] clean dressing using gauze sponges, not tissue or cotton balls. Peri-Wound Care: Sween Lotion (Moisturizing lotion) 1 x Per Week/30 Days Discharge Instructions: Apply moisturizing lotion as directed Prim Dressing: Sorbalgon AG  Dressing 2x2 (in/in) 1 x Per Week/30 Days ary Discharge Instructions: Apply to wound bed as instructed Secondary Dressing: Woven Gauze Sponge, Non-Sterile 4x4 in 1 x Per Week/30  Days Discharge Instructions: Apply over primary dressing as directed. Secondary Dressing: Zetuvit Plus 4x8 in 1 x Per Week/30 Days Discharge Instructions: Apply over primary dressing as directed. Secured With: Transpore Surgical T ape, 2x10 (in/yd) 1 x Per Week/30 Days Discharge Instructions: Secure dressing with tape as directed. WOUND #4: - Lower Leg Wound Laterality: Left, Anterior Cleanser: Soap and Water 1 x Per Week/30 Days Discharge Instructions: May shower and wash wound with dial antibacterial soap and water prior to dressing change. Cleanser: Wound Cleanser 1 x Per Week/30 Days Discharge Instructions: Cleanse the wound with wound cleanser prior to applying [name] clean dressing using gauze sponges, not tissue or cotton balls. Peri-Wound Care: Sween Lotion (Moisturizing lotion) 1 x Per Week/30 Days Discharge Instructions: Apply moisturizing lotion as directed Topical: Mupirocin Ointment 1 x Per Week/30 Days Discharge Instructions: Apply Mupirocin (Bactroban) as instructed Prim Dressing: Sorbalgon AG Dressing, 4x4 (in/in) 1 x Per Week/30 Days ary Discharge Instructions: Apply to wound bed as instructed Secondary Dressing: Woven Gauze Sponge, Non-Sterile 4x4 in 1 x Per Week/30 Days Discharge Instructions: Apply over primary dressing as directed. Secondary Dressing: Zetuvit Plus 4x8 in 1 x Per Week/30 Days Discharge Instructions: Apply over primary dressing as directed. Secured With: Transpore Surgical T ape, 2x10 (in/yd) 1 x Per Week/30 Days Discharge Instructions: Secure dressing with tape as directed. Com pression Wrap: ThreePress (3 layer compression wrap) 1 x Per Week/30 Days Discharge Instructions: Apply three layer compression as directed. Com pression Stockings: Jobst Farrow Wrap 4000 Compression Amount: 30-40 mmHg (left) Discharge Instructions: Apply Francia Greaves daily as instructed. Apply first thing in the morning, remove at night before bed. Dendinger, Concepcion [name] ([id number])  [id number] Page 9 of 11 03/09/2022: His leg looks better and the wound is cleaner. The right dorsal foot wound has [name] very thick layer of slough on the surface. Both wounds are smaller. I used [name] curette to debride slough from the left anterior tibial wound and slough and nonviable subcutaneous tissue from the right dorsal foot wound. He will complete his course of oral levofloxacin. We will continue silver alginate to both sites. Continue 3 layer compression. Follow-up in 1 week. Electronic Signature(s) Signed: 03/09/2022 3:45:47 PM By: Adline Peals Signed: 03/09/2022 3:51:52 PM By: [name] MD FACS Previous Signature: 03/09/2022 3:42:40 PM Version By: [name] MD FACS Entered By: Adline Peals on 03/09/2022 15:45:33 -------------------------------------------------------------------------------- HxROS Details Patient Name: Date of Service: [place] 03/09/2022 3:00 PM Medical Record Number: [id number] Patient Account Number: [id number] Date of Birth/Sex: Treating RN: 06-01-1945 (76 y.o. M) Primary Care Provider: [name] Other Clinician: Referring Provider: Treating Provider/Extender: [name] in Treatment: 3 Information Obtained From Patient Constitutional Symptoms (General Health) Medical History: Past Medical History Notes: obsesity Hematologic/Lymphatic Medical History: Positive for: Anemia; Lymphedema Past Medical History Notes: chronic anticoagulation Respiratory Medical History: Positive for: Sleep Apnea Cardiovascular Medical History: Positive for: Arrhythmia - PAF; Congestive Heart Failure - diastolic; Hypertension Past Medical History Notes: pericarditis Gastrointestinal Medical History: Past Medical History Notes: pt says 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 but they could not prove it Endocrine Medical History: Positive for: Type II Diabetes -  takes no meds, "borderline A1c" Time with diabetes: 2-3 years ago Blood sugar tested every day: No Genitourinary Medical History: Ransford, Aaiden [name] (  [id number]) [id number] Page 10 of 11 Past Medical History Notes: BPH Neurologic Medical History: Positive for: Neuropathy Past Medical History Notes: 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, embolic cerebral infarction, CVA Oncologic Medical History: Negative for: Received Chemotherapy; Received Radiation Psychiatric Medical History: Past Medical History Notes: depression Immunizations Pneumococcal Vaccine: Received Pneumococcal Vaccination: Yes Received Pneumococcal Vaccination On or After 60th Birthday: Yes Implantable Devices None Hospitalization / Surgery History Type of Hospitalization/Surgery right and left heart cath colonoscopy esophagogastroduodenoscopy subxyphoid pericardial window intraoperative transesophageal echocardiogram laparoscopic gastric banding with hiatal hernia repair breath tek h pylori cholecysectomy arthroscopy knee w/ drilling lithotripsy tonsillectomy total knee replacement trigger finger repair wisdom tooth extraction Family and Social History Cancer: Yes - Paternal Grandparents; Diabetes: No; Heart Disease: Yes - Maternal Grandparents,Father; Hereditary Spherocytosis: No; Hypertension: No; Kidney Disease: No; Lung Disease: No; Seizures: No; Stroke: No; Thyroid Problems: No; Tuberculosis: No; Former smoker - smoked as [name] teen; Marital Status - Married; Alcohol Use: Never; Drug Use: No History; Caffeine Use: Never; Financial Concerns: No; Food, Clothing or Shelter Needs: No; Support System Lacking: No; Transportation Concerns: No Electronic Signature(s) Signed: 03/09/2022 3:43:49 PM By: [name] MD FACS Entered By: [name] on 03/09/2022 15:40:39 -------------------------------------------------------------------------------- SuperBill Details Patient Name: Date of  Service: [name], Lukah [name]. 03/09/2022 Medical Record Number: [id number] Patient Account Number: [id number] Date of Birth/Sex: Treating RN: [birth year] (76 y.o. M) Primary Care Provider: [name] Other Clinician: Referring Provider: Treating Provider/Extender: [name] in Treatment: 3 Diagnosis Coding Lance, Timathy [name] ([id number]) [id number] Page 11 of 11 ICD-10 Codes Code Description [phone] Non-pressure chronic ulcer of other part of right foot with fat layer exposed L97.812 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d L97.822 Non-pressure chronic ulcer of other part of left lower leg with fat layer exposed I50.30 Unspecified diastolic (congestive) heart failure J96.11 Chronic respiratory failure with hypoxia L03.115 Cellulitis of right lower limb E11.621 Type 2 diabetes mellitus with foot ulcer E66.01 Morbid (severe) obesity due to excess calories I89.0 Lymphedema, not elsewhere classified Facility Procedures : CPT4 Code: [id number] Description: 11042 - DEB SUBQ TISSUE 20 SQ CM/< ICD-10 Diagnosis Description L97.512 Non-pressure chronic ulcer of other part of right foot with fat layer exposed Modifier: Quantity: 1 : CPT4 Code: [id number] Description: 03013 - DEBRIDE WOUND 1ST 20 SQ CM OR < ICD-10 Diagnosis Description L97.822 Non-pressure chronic ulcer of other part of left lower leg with fat layer exposed Modifier: Quantity: 1 : CPT4 Code: [id number] Description: 79728 - DEBRIDE WOUND EA ADDL 20 SQ CM ICD-10 Diagnosis Description L97.822 Non-pressure chronic ulcer of other part of left lower leg with fat layer exposed Modifier: Quantity: 1 Physician Procedures : CPT4 Code Description Modifier [id number] 99214 - WC PHYS LEVEL 4 - EST PT 25 ICD-10 Diagnosis Description L97.512 Non-pressure chronic ulcer of other part of right foot with fat layer exposed L97.822 Non-pressure chronic  ulcer of other part of left lower  leg with fat layer exposed I50.30 Unspecified diastolic (congestive) heart failure E11.621 Type 2 diabetes mellitus with foot ulcer Quantity: 1 : [id number] 11042 - WC PHYS SUBQ TISS 20 SQ CM ICD-10 Diagnosis Description L97.512 Non-pressure chronic ulcer of other part of right foot with fat layer exposed Quantity: 1 : [id number] 97597 - WC PHYS DEBR WO ANESTH 20 SQ CM ICD-10 Diagnosis Description L97.822 Non-pressure chronic ulcer of other part of left lower leg with fat layer exposed Quantity: 1 : [id number] 97598 - WC PHYS DEBR WO ANESTH EA ADD 20 CM  ICD-10 Diagnosis Description L97.822 Non-pressure chronic ulcer of other part of left lower leg with fat layer exposed Quantity: 1 Electronic Signature(s) Signed: 03/09/2022 3:43:34 PM By: [name] MD FACS Entered By: [name] on 03/09/2022 15:43:34

## 2022-03-10 NOTE — Progress Notes (Signed)
Walkowski, Tereso A ([id number]) [id number] Page 1 of 9 Visit Report for 03/09/2022 Arrival Information Details Patient Name: Date of Service: [place]. 03/09/2022 3:00 PM Medical Record Number: [id number] Patient Account Number: [id number] Date of Birth/Sex: Treating RN: 04-Apr-1945 (76 y.o. [name] Primary Care Chayce Robbins: [name] Other Clinician: Referring Malvina Schadler: Treating Morry Veiga/Extender: [name] in Treatment: 3 Visit Information History Since Last Visit Added or deleted any medications: No Patient Arrived: Other Any new allergies or adverse reactions: No Arrival Time: 14:48 Had a fall or experienced change in No Accompanied By: self activities of daily living that may affect Transfer Assistance: None risk of falls: Patient Identification Verified: Yes Signs or symptoms of abuse/neglect since last visito No Secondary Verification Process Completed: Yes Hospitalized since last visit: No Patient Requires Transmission-Based Precautions: No Implantable device outside of the clinic excluding No Patient Has Alerts: Yes cellular tissue based products placed in the center Patient Alerts: Patient on Blood Thinner since last visit: Has Dressing in Place as Prescribed: Yes Has Compression in Place as Prescribed: Yes Pain Present Now: No Electronic Signature(s) Signed: 03/09/2022 3:45:47 PM By: Adline Peals Entered By: Adline Peals on 03/09/2022 14:50:37 -------------------------------------------------------------------------------- Compression Therapy Details Patient Name: Date of Service: [name], Elber A. 03/09/2022 3:00 PM Medical Record Number: [id number] Patient Account Number: [id number] Date of Birth/Sex: Treating RN: 1945/12/09 (76 y.o. [name] Primary Care Isaac Lacson: [name] Other Clinician: Referring Demani Weyrauch: Treating Patrisia Faeth/Extender: [name] in Treatment: 3 Compression Therapy Performed for Wound Assessment: Wound #4 Left,Anterior Lower Leg Performed By: Clinician Adline Peals, RN Compression Type: Three Layer Post Procedure Diagnosis Same as Pre-procedure Electronic Signature(s) Signed: 03/09/2022 3:45:47 PM By: Adline Peals Entered By: Adline Peals on 03/09/2022 15:44:30 Bores, Kayn A ([id number]) [id number] Page 2 of 9 -------------------------------------------------------------------------------- Compression Therapy Details Patient Name: Date of Service: [name], Sudais A. 03/09/2022 3:00 PM Medical Record Number: [id number] Patient Account Number: [id number] Date of Birth/Sex: Treating RN: 02/11/1946 (76 y.o. [name] Primary Care Dierra Riesgo: [name] Other Clinician: Referring Kaitlynn Tramontana: Treating Vinnie Bobst/Extender: [name] in Treatment: 3 Compression Therapy Performed for Wound Assessment: Wound #1 Right,Dorsal Foot Performed By: Clinician Adline Peals, RN Compression Type: Three Layer Post Procedure Diagnosis Same as Pre-procedure Electronic Signature(s) Signed: 03/09/2022 3:45:47 PM By: Adline Peals Entered By: Adline Peals on 03/09/2022 15:44:30 -------------------------------------------------------------------------------- Encounter Discharge Information Details Patient Name: Date of Service: [place], Chipper A. 03/09/2022 3:00 PM Medical Record Number: [id number] Patient Account Number: [id number] Date of Birth/Sex: Treating RN: 10-04-1945 (76 y.o. [name] Primary Care Caydence Enck: [name] Other Clinician: Referring Wendee Hata: Treating Aliou Mealey/Extender: [name] in Treatment: 3 Encounter Discharge Information Items Post Procedure Vitals Discharge Condition: Stable Temperature (F): 98.4 Ambulatory Status:  Other Pulse (bpm): 65 Discharge Destination: Home Respiratory Rate (breaths/min): 20 Transportation: Private Auto Blood Pressure (mmHg): 124/65 Accompanied By: self Schedule Follow-up Appointment: Yes Clinical Summary of Care: Patient Declined Electronic Signature(s) Signed: 03/09/2022 3:45:47 PM By: Adline Peals Entered By: Adline Peals on 03/09/2022 15:45:03 -------------------------------------------------------------------------------- Lower Extremity Assessment Details Patient Name: Date of Service: [place], [place]. 03/09/2022 3:00 PM Medical Record Number: [id number] Patient Account Number: [id number] Date of Birth/Sex: Treating RN: Nov 21, 1945 (76 y.o. [name] Primary Care Adaora Mchaney: [name] Other Clinician: Referring Tu Shimmel: Treating Niajah Sipos/Extender: [name] in Treatment: 3 Edema Assessment Assessed: [name]: No] [name]: No] [Left: Edema] [Right: :] Calf Bitton,  Aniken A ([id number]) [id number] Page 3 of 9 Left: Right: Point of Measurement: From Medial Instep 51.2 cm 56 cm Ankle Left: Right: Point of Measurement: From Medial Instep 31 cm 29 cm Vascular Assessment Pulses: Dorsalis Pedis Palpable: [Left:Yes] [Right:Yes] Electronic Signature(s) Signed: 03/09/2022 3:45:47 PM By: Adline Peals Entered By: Adline Peals on 03/09/2022 15:04:30 -------------------------------------------------------------------------------- Multi Wound Chart Details Patient Name: Date of Service: [place], Shalik A. 03/09/2022 3:00 PM Medical Record Number: [id number] Patient Account Number: [id number] Date of Birth/Sex: Treating RN: 1945-06-12 (76 y.o. M) Primary Care Kodie Pick: [name] Other Clinician: Referring Jager Koska: Treating Juanangel Soderholm/Extender: [name] in Treatment: 3 Vital Signs Height(in): 72 Pulse(bpm): 62 Weight(lbs): 375 Blood  Pressure(mmHg): 124/65 Body Mass Index(BMI): 50.9 Temperature(F): 98.4 Respiratory Rate(breaths/min): 20 [1:Photos:] [N/A:N/A] Right, Dorsal Foot Left, Anterior Lower Leg N/A Wound Location: Blister Blister N/A Wounding Event: Diabetic Wound/Ulcer of the Lower Lymphedema N/A Primary Etiology: Extremity Anemia, Lymphedema, Sleep Apnea, Anemia, Lymphedema, Sleep Apnea, N/A Comorbid History: Arrhythmia, Congestive Heart Failure, Arrhythmia, Congestive Heart Failure, Hypertension, Type II Diabetes, Hypertension, Type II Diabetes, Neuropathy Neuropathy 01/31/2022 02/13/2021 N/A Date Acquired: 3 3 N/A Weeks of Treatment: Open Open N/A Wound Status: No No N/A Wound Recurrence: 1.5x2.2x0.1 9.5x9.5x0.1 N/A Measurements L x W x D (cm) 2.592 70.882 N/A A (cm) : rea 0.259 7.088 N/A Volume (cm) : 77.00% -759.50% N/A % Reduction in Area: 77.00% -759.20% N/A % Reduction in Volume: Grade 1 Full Thickness Without Exposed N/A Classification: Support Structures Medium Medium N/A Exudate Amount: Serosanguineous Serosanguineous N/A Exudate Type: red, brown red, brown N/A Exudate Color: Distinct, outline attached Distinct, outline attached N/A Wound Margin: Medium (34-66%) Large (67-100%) N/A Granulation Amount: Darrough, Kayen A ([id number]) [id number] Page 4 of 9 Red Red, Pink N/A Granulation Quality: Medium (34-66%) Small (1-33%) N/A Necrotic Amount: Fat Layer (Subcutaneous Tissue): Yes Fat Layer (Subcutaneous Tissue): Yes N/A Exposed Structures: Fascia: No Fascia: No Tendon: No Tendon: No Muscle: No Muscle: No Joint: No Joint: No Bone: No Bone: No Medium (34-66%) None N/A Epithelialization: Debridement - Excisional Debridement - Selective/Open Wound N/A Debridement: Pre-procedure Verification/Time Out 15:12 15:12 N/A Taken: Lidocaine 4% Topical Solution Lidocaine 4% Topical Solution N/A Pain Control: Subcutaneous, [employer]  N/A Tissue Debrided: Skin/Subcutaneous Tissue Non-Viable Tissue N/A Level: 3.3 36 N/A Debridement A (sq cm): rea Curette Curette N/A Instrument: Minimum Minimum N/A Bleeding: Pressure Pressure N/A Hemostasis A chieved: Procedure was tolerated well Procedure was tolerated well N/A Debridement Treatment Response: 1.5x2.2x0.1 9.5x9.5x0.1 N/A Post Debridement Measurements L x W x D (cm) 0.259 7.088 N/A Post Debridement Volume: (cm) No Abnormalities Noted No Abnormalities Noted N/A Periwound Skin Texture: Dry/Scaly: Yes Dry/Scaly: No N/A Periwound Skin Moisture: Maceration: No Hemosiderin Staining: Yes Erythema: Yes N/A Periwound Skin Color: Erythema: No Hemosiderin Staining: Yes No Abnormality No Abnormality N/A Temperature: Debridement Debridement N/A Procedures Performed: Treatment Notes Electronic Signature(s) Signed: 03/09/2022 3:39:24 PM By: [name] MD FACS Entered By: [name] on 03/09/2022 15:39:24 -------------------------------------------------------------------------------- Multi-Disciplinary Care Plan Details Patient Name: Date of Service: [place], [place]. 03/09/2022 3:00 PM Medical Record Number: [id number] Patient Account Number: [id number] Date of Birth/Sex: Treating RN: May 12, 1945 (76 y.o. [name] Primary Care Khalilah Hoke: [name] Other Clinician: Referring Ryder Chesmore: Treating Rico Massar/Extender: [name] in Treatment: 3 Active Inactive Necrotic Tissue Nursing Diagnoses: Impaired tissue integrity related to necrotic/devitalized tissue Knowledge deficit related to management of necrotic/devitalized tissue Goals: Necrotic/devitalized tissue will be minimized in the wound bed Date Initiated: 02/13/2022 Target Resolution Date:  04/07/2022 Goal Status: Active Patient/caregiver will verbalize understanding of reason and process for debridement of necrotic tissue Date Initiated:  02/13/2022 Target Resolution Date: 04/07/2022 Goal Status: Active Interventions: Assess patient pain level pre-, during and post procedure and prior to discharge Provide education on necrotic tissue and debridement process Treatment Activities: Apply topical anesthetic as ordered : 02/13/2022 [facility], Eloise A ([id number]) [id number] Page 5 of 9 Notes: Wound/Skin Impairment Nursing Diagnoses: Impaired tissue integrity Knowledge deficit related to ulceration/compromised skin integrity Goals: Patient/caregiver will verbalize understanding of skin care regimen Date Initiated: 02/13/2022 Target Resolution Date: 04/07/2022 Goal Status: Active Interventions: Assess ulceration(s) every visit Treatment Activities: Skin care regimen initiated : 02/13/2022 Topical wound management initiated : 02/13/2022 Notes: Electronic Signature(s) Signed: 03/09/2022 3:45:47 PM By: Adline Peals Entered By: Adline Peals on 03/09/2022 15:11:43 -------------------------------------------------------------------------------- Pain Assessment Details Patient Name: Date of Service: [name], Cesareo A. 03/09/2022 3:00 PM Medical Record Number: [id number] Patient Account Number: [id number] Date of Birth/Sex: Treating RN: 1945-11-05 (76 y.o. [name] Primary Care Karlena Luebke: [name] Other Clinician: Referring Shaunie Boehm: Treating Santana Edell/Extender: [name] in Treatment: 3 Active Problems Location of Pain Severity and Description of Pain Patient Has Paino No Site Locations Rate the pain. Current Pain Level: 0 Pain Management and Medication Current Pain Management: Electronic Signature(s) Signed: 03/09/2022 3:45:47 PM By: Adline Peals Entered By: Adline Peals on 03/09/2022 14:50:45 Miyazaki, Jossue A ([id number]) [id number] Page 6 of  9 -------------------------------------------------------------------------------- Patient/Caregiver Education Details Patient Name: Date of Service: [place] ASRIEL, WESTRUP A. 12/7/2023andnbsp3:00 PM Medical Record Number: [id number] Patient Account Number: [id number] Date of Birth/Gender: Treating RN: March 23, 1946 (76 y.o. [name] Primary Care Physician: [name] Other Clinician: Referring Physician: Treating Physician/Extender: [name] in Treatment: 3 Education Assessment Education Provided To: Patient Education Topics Provided Wound/Skin Impairment: Methods: Explain/Verbal Responses: Reinforcements needed, State content correctly Electronic Signature(s) Signed: 03/09/2022 3:45:47 PM By: Adline Peals Entered By: Adline Peals on 03/09/2022 15:11:55 -------------------------------------------------------------------------------- Wound Assessment Details Patient Name: Date of Service: [place], Leonel A. 03/09/2022 3:00 PM Medical Record Number: [id number] Patient Account Number: [id number] Date of Birth/Sex: Treating RN: [birth year] (76 y.o. [name] Primary Care Ayce Pietrzyk: [name] Other Clinician: Referring Cyera Balboni: Treating Synia Douglass/Extender: [name] in Treatment: 3 Wound Status Wound Number: 1 Primary Diabetic Wound/Ulcer of the Lower Extremity Etiology: Wound Location: Right, Dorsal Foot Wound Open Wounding Event: Blister Status: Date Acquired: 01/31/2022 Comorbid Anemia, Lymphedema, Sleep Apnea, Arrhythmia, Congestive Heart Weeks Of Treatment: 3 History: Failure, Hypertension, Type II Diabetes, Neuropathy Clustered Wound: No Photos Wound Measurements Length: (cm) 1.5 Gaubert, Landrum A ([id number]) Width: (cm) 2.2 Depth: (cm) 0.1 Area: (cm) 2.592 Volume: (cm) 0.259 % Reduction in Area: 77% [id number] Page 7 of 9 % Reduction in  Volume: 77% Epithelialization: Medium (34-66%) Tunneling: No Undermining: No Wound Description Classification: Grade 1 Wound Margin: Distinct, outline attached Exudate Amount: Medium Exudate Type: Serosanguineous Exudate Color: red, brown Foul Odor After Cleansing: No Slough/Fibrino Yes Wound Bed Granulation Amount: Medium (34-66%) Exposed Structure Granulation Quality: Red Fascia Exposed: No Necrotic Amount: Medium (34-66%) Fat Layer (Subcutaneous Tissue) Exposed: Yes Necrotic Quality: Adherent Slough Tendon Exposed: No Muscle Exposed: No Joint Exposed: No Bone Exposed: No Periwound Skin Texture Texture Color No Abnormalities Noted: Yes No Abnormalities Noted: No Erythema: No Moisture Hemosiderin Staining: Yes No Abnormalities Noted: No Dry / Scaly: Yes Temperature / Pain Maceration: No Temperature: No Abnormality Treatment Notes Wound #1 (Foot) Wound Laterality: Dorsal, Right Cleanser Soap  and Water Discharge Instruction: May shower and wash wound with dial antibacterial soap and water prior to dressing change. Wound Cleanser Discharge Instruction: Cleanse the wound with wound cleanser prior to applying a clean dressing using gauze sponges, not tissue or cotton balls. Peri-Wound Care Sween Lotion (Moisturizing lotion) Discharge Instruction: Apply moisturizing lotion as directed Topical Primary Dressing Sorbalgon AG Dressing 2x2 (in/in) Discharge Instruction: Apply to wound bed as instructed Secondary Dressing Woven Gauze Sponge, Non-Sterile 4x4 in Discharge Instruction: Apply over primary dressing as directed. Zetuvit Plus 4x8 in Discharge Instruction: Apply over primary dressing as directed. Secured With Transpore Surgical Tape, 2x10 (in/yd) Discharge Instruction: Secure dressing with tape as directed. Compression Wrap Compression Stockings Add-Ons Electronic Signature(s) Signed: 03/09/2022 3:45:47 PM By: Adline Peals Entered By: Adline Peals  on 03/09/2022 15:07:08 [place]avia, Brogan A ([id number]) [id number] Page 8 of 9 -------------------------------------------------------------------------------- Wound Assessment Details Patient Name: Date of Service: [place] 03/09/2022 3:00 PM Medical Record Number: [id number] Patient Account Number: [id number] Date of Birth/Sex: Treating RN: May 08, 1945 (76 y.o. [name] Primary Care Lizzet Hendley: [name] Other Clinician: Referring Vernadine Coombs: Treating Deauna Yaw/Extender: [name] in Treatment: 3 Wound Status Wound Number: 4 Primary Lymphedema Etiology: Wound Location: Left, Anterior Lower Leg Wound Open Wounding Event: Blister Status: Date Acquired: 02/13/2021 Comorbid Anemia, Lymphedema, Sleep Apnea, Arrhythmia, Congestive Heart Weeks Of Treatment: 3 History: Failure, Hypertension, Type II Diabetes, Neuropathy Clustered Wound: No Photos Wound Measurements Length: (cm) 9.5 Width: (cm) 9.5 Depth: (cm) 0.1 Area: (cm) 70.882 Volume: (cm) 7.088 % Reduction in Area: -759.5% % Reduction in Volume: -759.2% Epithelialization: None Tunneling: No Undermining: No Wound Description Classification: Full Thickness Without Exposed Suppor Wound Margin: Distinct, outline attached Exudate Amount: Medium Exudate Type: Serosanguineous Exudate Color: red, brown t Structures Foul Odor After Cleansing: No Slough/Fibrino Yes Wound Bed Granulation Amount: Large (67-100%) Exposed Structure Granulation Quality: Red, Pink Fascia Exposed: No Necrotic Amount: Small (1-33%) Fat Layer (Subcutaneous Tissue) Exposed: Yes Necrotic Quality: Adherent Slough Tendon Exposed: No Muscle Exposed: No Joint Exposed: No Bone Exposed: No Periwound Skin Texture Texture Color No Abnormalities Noted: Yes No Abnormalities Noted: No Erythema: Yes Moisture Hemosiderin Staining: Yes No Abnormalities Noted: No Dry / Scaly: No  Temperature / Pain Temperature: No Abnormality Treatment Notes Wound #4 (Lower Leg) Wound Laterality: Left, Anterior Cleanser Soap and Water Discharge Instruction: May shower and wash wound with dial antibacterial soap and water prior to dressing change. Kitt, Anatole A ([id number]) [id number] Page 9 of 9 Wound Cleanser Discharge Instruction: Cleanse the wound with wound cleanser prior to applying a clean dressing using gauze sponges, not tissue or cotton balls. Peri-Wound Care Sween Lotion (Moisturizing lotion) Discharge Instruction: Apply moisturizing lotion as directed Topical Mupirocin Ointment Discharge Instruction: Apply Mupirocin (Bactroban) as instructed Primary Dressing Sorbalgon AG Dressing, 4x4 (in/in) Discharge Instruction: Apply to wound bed as instructed Secondary Dressing Woven Gauze Sponge, Non-Sterile 4x4 in Discharge Instruction: Apply over primary dressing as directed. Zetuvit Plus 4x8 in Discharge Instruction: Apply over primary dressing as directed. Secured With Transpore Surgical Tape, 2x10 (in/yd) Discharge Instruction: Secure dressing with tape as directed. Compression Wrap ThreePress (3 layer compression wrap) Discharge Instruction: Apply three layer compression as directed. Compression Stockings Jobst Farrow Wrap 4000 Quantity: 1 Left Leg Compression Amount: 30-40 mmHg Discharge Instruction: Apply Francia Greaves daily as instructed. Apply first thing in the morning, remove at night before bed. Add-Ons Electronic Signature(s) Signed: 03/09/2022 3:45:47 PM By: Adline Peals Entered By: Adline Peals on 03/09/2022 15:07:27 -------------------------------------------------------------------------------- Vitals Details  Patient Name: Date of Service: MAXEMILIANO, RIEL A. 03/09/2022 3:00 PM Medical Record Number: [id number] Patient Account Number: [id number] Date of Birth/Sex: Treating RN: [birth year] (76 y.o. [name] Primary Care Oaklie Durrett: [name] Other Clinician: Referring Aayansh Codispoti: Treating Kennon Encinas/Extender: [name] in Treatment: 3 Vital Signs Time Taken: 14:50 Temperature (F): 98.4 Height (in): 72 Pulse (bpm): 65 Weight (lbs): 375 Respiratory Rate (breaths/min): 20 Body Mass Index (BMI): 50.9 Blood Pressure (mmHg): 124/65 Reference Range: 80 - 120 mg / dl Electronic Signature(s) Signed: 03/09/2022 3:45:47 PM By: Adline Peals Entered By: Adline Peals on 03/09/2022 14:53:51

## 2022-03-16 ENCOUNTER — Encounter (HOSPITAL_BASED_OUTPATIENT_CLINIC_OR_DEPARTMENT_OTHER): Payer: Medicare Other | Admitting: Internal Medicine

## 2022-03-21 ENCOUNTER — Encounter (HOSPITAL_BASED_OUTPATIENT_CLINIC_OR_DEPARTMENT_OTHER): Payer: Medicare Other | Admitting: General Surgery

## 2022-03-21 DIAGNOSIS — E11621 Type 2 diabetes mellitus with foot ulcer: Secondary | ICD-10-CM | POA: Diagnosis not present

## 2022-03-21 DIAGNOSIS — L97512 Non-pressure chronic ulcer of other part of right foot with fat layer exposed: Secondary | ICD-10-CM | POA: Diagnosis not present

## 2022-03-21 DIAGNOSIS — L97812 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d: Secondary | ICD-10-CM | POA: Diagnosis not present

## 2022-03-21 DIAGNOSIS — L03115 Cellulitis of right lower limb: Secondary | ICD-10-CM | POA: Diagnosis not present

## 2022-03-21 DIAGNOSIS — I890 Lymphedema, not elsewhere classified: Secondary | ICD-10-CM | POA: Diagnosis not present

## 2022-03-21 DIAGNOSIS — L97822 Non-pressure chronic ulcer of other part of left lower leg with fat layer exposed: Secondary | ICD-10-CM | POA: Diagnosis not present

## 2022-03-22 NOTE — Progress Notes (Signed)
[name], [name] ([id number]) [id number] Page 1 of 6 Visit Report for 03/21/2022 Arrival Information Details Patient Name: Date of Service: [name], [name]. 03/21/2022 7:30 [name] Medical Record Number: [id number] Patient Account Number: [id number] Date of Birth/Sex: Treating RN: 1945-07-10 (76 y.o. Collene Gobble Primary Care Osias Resnick: [name] Other Clinician: Referring Madonna Flegal: Treating Cliffton Spradley/Extender: [name] in Treatment: 5 Visit Information History Since Last Visit Added or deleted any medications: No Patient Arrived: Wheel Chair Any new allergies or adverse reactions: No Arrival Time: 07:45 Had [name] fall or experienced change in No Accompanied By: self activities of daily living that may affect Transfer Assistance: None risk of falls: Patient Identification Verified: Yes Signs or symptoms of abuse/neglect since last visito No Patient Requires Transmission-Based Precautions: No Hospitalized since last visit: No Patient Has Alerts: Yes Implantable device outside of the clinic excluding No Patient Alerts: Patient on Blood Thinner cellular tissue based products placed in the center since last visit: Has Dressing in Place as Prescribed: Yes Has Compression in Place as Prescribed: Yes Pain Present Now: No Electronic Signature(s) Signed: 03/21/2022 5:57:54 PM By: Dellie Catholic RN Entered By: Dellie Catholic on 03/21/2022 17:54:41 -------------------------------------------------------------------------------- Compression Therapy Details Patient Name: Date of Service: [name], [name]. 03/21/2022 7:30 [name] Medical Record Number: [id number] Patient Account Number: [id number] Date of Birth/Sex: Treating RN: Aug 21, 1945 (76 y.o. Collene Gobble Primary Care Melanye Hiraldo: [name] Other Clinician: Referring Gibbs Naugle: Treating Keron Koffman/Extender: [name] in Treatment:  5 Compression Therapy Performed for Wound Assessment: Wound #1 Right,Dorsal Foot Performed By: Clinician Dellie Catholic, RN Compression Type: Three Layer Electronic Signature(s) Signed: 03/21/2022 5:57:54 PM By: Dellie Catholic RN Entered By: Dellie Catholic on 03/21/2022 17:56:27 -------------------------------------------------------------------------------- Compression Therapy Details Patient Name: Date of Service: [name] 03/21/2022 7:30 [name] Medical Record Number: [id number] Patient Account Number: [id number] KENNEITH, STIEF [name] ([id number]) [id number] Page 2 of 6 Date of Birth/Sex: Treating RN: [birth year] (76 y.o. Collene Gobble Primary Care Retta Pitcher: [name] Other Clinician: Referring Charish Schroepfer: Treating Trinidi Toppins/Extender: [name] in Treatment: 5 Compression Therapy Performed for Wound Assessment: Wound #4 Left,Anterior Lower Leg Performed By: Clinician Dellie Catholic, RN Compression Type: Three Layer Electronic Signature(s) Signed: 03/21/2022 5:57:54 PM By: Dellie Catholic RN Entered By: Dellie Catholic on 03/21/2022 17:56:27 -------------------------------------------------------------------------------- Encounter Discharge Information Details Patient Name: Date of Service: [name], [name]. 03/21/2022 7:30 [name] Medical Record Number: [id number] Patient Account Number: [id number] Date of Birth/Sex: Treating RN: February 16, 1946 (76 y.o. Collene Gobble Primary Care Nikoleta Dady: [name] Other Clinician: Referring Lilliah Priego: Treating Djibril Glogowski/Extender: [name] in Treatment: 5 Encounter Discharge Information Items Discharge Condition: Stable Ambulatory Status: Wheelchair Discharge Destination: Home Transportation: Private Auto Accompanied By: self Schedule Follow-up Appointment: Yes Clinical Summary of Care: Patient Declined Electronic Signature(s) Signed:  03/21/2022 5:57:54 PM By: Dellie Catholic RN Entered By: Dellie Catholic on 03/21/2022 17:57:16 -------------------------------------------------------------------------------- Patient/Caregiver Education Details Patient Name: Date of Service: [name], [name]. 12/19/2023andnbsp7:30 [name] Record Number: [id number] Patient Account Number: [id number] Date of Birth/Gender: Treating RN: [birth year] (76 y.o. Collene Gobble Primary Care Physician: [name] Other Clinician: Referring Physician: Treating Physician/Extender: [name] in Treatment: 5 Education Assessment Education Provided To: Patient Education Topics Provided Wound/Skin Impairment: Methods: Explain/Verbal Responses: Return demonstration correctly Electronic Signature(s) MORADI, Tip [name] ([id number]) [id number] Page 3 of 6 Signed: 03/21/2022 5:57:54 PM By: Dellie Catholic RN Entered By: Minus Liberty,  Mechele Claude on 03/21/2022 17:57:02 -------------------------------------------------------------------------------- Wound Assessment Details Patient Name: Date of Service: [name] 03/21/2022 7:30 [name] Medical Record Number: [id number] Patient Account Number: [id number] Date of Birth/Sex: Treating RN: [birth year] (76 y.o. Collene Gobble Primary Care Marquisa Salih: [name] Other Clinician: Referring Odies Desa: Treating [name]/Extender: [name] in Treatment: 5 Wound Status Wound Number: 1 Primary Diabetic Wound/Ulcer of the Lower Extremity Etiology: Wound Location: Right, Dorsal Foot Wound Open Wounding Event: Blister Status: Date Acquired: 01/31/2022 Comorbid Anemia, Lymphedema, Sleep Apnea, Arrhythmia, Congestive Weeks Of Treatment: 5 History: Heart Failure, Hypertension, Type II Diabetes, Neuropathy Clustered Wound: No Wound Measurements Length: (cm) 1.5 Width: (cm) 2.2 Depth: (cm) 0.1 Area: (cm)  2.592 Volume: (cm) 0.259 % Reduction in Area: 77% % Reduction in Volume: 77% Epithelialization: Medium (34-66%) Tunneling: No Undermining: No Wound Description Classification: Grade 1 Wound Margin: Distinct, outline attached Exudate Amount: Medium Exudate Type: Serosanguineous Exudate Color: red, brown Foul Odor After Cleansing: No Slough/Fibrino Yes Wound Bed Granulation Amount: Medium (34-66%) Exposed Structure Granulation Quality: Red Fascia Exposed: No Necrotic Amount: Medium (34-66%) Fat Layer (Subcutaneous Tissue) Exposed: Yes Necrotic Quality: Adherent Slough Tendon Exposed: No Muscle Exposed: No Joint Exposed: No Bone Exposed: No Periwound Skin Texture Texture Color No Abnormalities Noted: Yes No Abnormalities Noted: No Erythema: No Moisture Hemosiderin Staining: Yes No Abnormalities Noted: No Dry / Scaly: Yes Temperature / Pain Maceration: No Temperature: No Abnormality Treatment Notes Wound #1 (Foot) Wound Laterality: Dorsal, Right Cleanser Soap and Water Discharge Instruction: May shower and wash wound with dial antibacterial soap and water prior to dressing change. Wound Cleanser Discharge Instruction: Cleanse the wound with wound cleanser prior to applying [name] clean dressing using gauze sponges, not tissue or cotton balls. Peri-Wound Care Sween Lotion (Moisturizing lotion) Discharge Instruction: Apply moisturizing lotion as directed Popelka, Gerasimos [name] ([id number]) [id number] Page 4 of 6 Topical Primary Dressing Sorbalgon AG Dressing 2x2 (in/in) Discharge Instruction: Apply to wound bed as instructed Secondary Dressing Woven Gauze Sponge, Non-Sterile 4x4 in Discharge Instruction: Apply over primary dressing as directed. Zetuvit Plus 4x8 in Discharge Instruction: Apply over primary dressing as directed. Secured With Transpore Surgical Tape, 2x10 (in/yd) Discharge Instruction: Secure dressing with tape as  directed. Compression Wrap Compression Stockings Add-Ons Electronic Signature(s) Signed: 03/21/2022 5:57:54 PM By: Dellie Catholic RN Entered By: Dellie Catholic on 03/21/2022 17:55:27 -------------------------------------------------------------------------------- Wound Assessment Details Patient Name: Date of Service: [place], Keoni [name]. 03/21/2022 7:30 [name] Medical Record Number: [id number] Patient Account Number: [id number] Date of Birth/Sex: Treating RN: 1946-03-09 (76 y.o. Collene Gobble Primary Care Louisa Favaro: [name] Other Clinician: Referring Aiva Miskell: Treating Jaron Czarnecki/Extender: [name] in Treatment: 5 Wound Status Wound Number: 4 Primary Lymphedema Etiology: Wound Location: Left, Anterior Lower Leg Wound Open Wounding Event: Blister Status: Date Acquired: 02/13/2021 Comorbid Anemia, Lymphedema, Sleep Apnea, Arrhythmia, Congestive Weeks Of Treatment: 5 History: Heart Failure, Hypertension, Type II Diabetes, Neuropathy Clustered Wound: No Wound Measurements Length: (cm) 0.5 Width: (cm) 0.5 Depth: (cm) 0.1 Area: (cm) 0.196 Volume: (cm) 0.02 % Reduction in Area: 97.6% % Reduction in Volume: 97.6% Epithelialization: None Tunneling: No Undermining: No Wound Description Classification: Full Thickness Without Exposed Suppor Wound Margin: Distinct, outline attached Exudate Amount: Medium Exudate Type: Serosanguineous Exudate Color: red, brown t Structures Foul Odor After Cleansing: No Slough/Fibrino Yes Wound Bed Granulation Amount: Large (67-100%) Exposed Structure Granulation Quality: Red, Pink Fascia Exposed: No Necrotic Amount: Small (1-33%) Fat Layer (Subcutaneous Tissue) Exposed: Yes Necrotic Quality: Adherent Slough Tendon  Exposed: No Muscle Exposed: No Joint Exposed: No Bone Exposed: No Trouten, Shjon [name] ([id number]) [id number] Page 5 of 6 Periwound Skin Texture Texture Color No  Abnormalities Noted: Yes No Abnormalities Noted: No Erythema: Yes Moisture Hemosiderin Staining: Yes No Abnormalities Noted: No Dry / Scaly: No Temperature / Pain Temperature: No Abnormality Treatment Notes Wound #4 (Lower Leg) Wound Laterality: Left, Anterior Cleanser Soap and Water Discharge Instruction: May shower and wash wound with dial antibacterial soap and water prior to dressing change. Wound Cleanser Discharge Instruction: Cleanse the wound with wound cleanser prior to applying [name] clean dressing using gauze sponges, not tissue or cotton balls. Peri-Wound Care Sween Lotion (Moisturizing lotion) Discharge Instruction: Apply moisturizing lotion as directed Topical Mupirocin Ointment Discharge Instruction: Apply Mupirocin (Bactroban) as instructed Primary Dressing Sorbalgon AG Dressing, 4x4 (in/in) Discharge Instruction: Apply to wound bed as instructed Secondary Dressing Woven Gauze Sponge, Non-Sterile 4x4 in Discharge Instruction: Apply over primary dressing as directed. Zetuvit Plus 4x8 in Discharge Instruction: Apply over primary dressing as directed. Secured With Transpore Surgical Tape, 2x10 (in/yd) Discharge Instruction: Secure dressing with tape as directed. Compression Wrap ThreePress (3 layer compression wrap) Discharge Instruction: Apply three layer compression as directed. Compression Stockings Jobst Farrow Wrap 4000 Quantity: 1 Left Leg Compression Amount: 30-40 mmHg Discharge Instruction: Apply Francia Greaves daily as instructed. Apply first thing in the morning, remove at night before bed. Add-Ons Electronic Signature(s) Signed: 03/21/2022 5:57:54 PM By: Dellie Catholic RN Entered By: Dellie Catholic on 03/21/2022 17:55:45 -------------------------------------------------------------------------------- Vitals Details Patient Name: Date of Service: [place], [name]. 03/21/2022 7:30 [name] Medical Record Number: [id number] Patient Account Number:  [id number] Date of Birth/Sex: Treating RN: [birth year] (76 y.o. Collene Gobble Primary Care Wilene Pharo: [name] Other Clinician: Referring Zareen Jamison: Treating Elmo Rio/Extender: [name] in Treatment: [place], [place] ([id number]) [id number] Page 6 of 6 Vital Signs Time Taken: 07:45 Temperature (F): 98.6 Height (in): 72 Pulse (bpm): 76 Weight (lbs): 375 Respiratory Rate (breaths/min): 18 Body Mass Index (BMI): 50.9 Blood Pressure (mmHg): 112/69 Reference Range: 80 - 120 mg / dl Electronic Signature(s) Signed: 03/21/2022 5:57:54 PM By: Dellie Catholic RN Entered By: Dellie Catholic on 03/21/2022 17:55:10

## 2022-03-22 NOTE — Progress Notes (Signed)
HINEY, Elohim A ([id number]) [id number] Page 1 of 1 Visit Report for 03/21/2022 SuperBill Details Patient Name: Date of Service: EMORI, KAMAU A. 03/21/2022 Medical Record Number: [id number] Patient Account Number: [id number] Date of Birth/Sex: Treating RN: [birth year] (76 y.o. Collene Gobble Primary Care Provider: [name] Other Clinician: Referring Provider: Treating Provider/Extender: [name] in Treatment: 5 Diagnosis Coding ICD-10 Codes Code Description 6406559856 Non-pressure chronic ulcer of other part of right foot with fat layer exposed L97.812 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d L97.822 Non-pressure chronic ulcer of other part of left lower leg with fat layer exposed I50.30 Unspecified diastolic (congestive) heart failure J96.11 Chronic respiratory failure with hypoxia L03.115 Cellulitis of right lower limb E11.621 Type 2 diabetes mellitus with foot ulcer E66.01 Morbid (severe) obesity due to excess calories I89.0 Lymphedema, not elsewhere classified Facility Procedures CPT4 Description Modifier Quantity Code [id number] BILATERAL: Application of multi-layer venous compression system; leg (below knee), including ankle and 1 foot. Electronic Signature(s) Signed: 03/21/2022 5:57:54 PM By: Dellie Catholic RN Signed: 03/22/2022 9:12:08 AM By: [name] MD FACS Entered By: Dellie Catholic on 03/21/2022 17:57:27

## 2022-03-25 ENCOUNTER — Other Ambulatory Visit: Payer: Self-pay | Admitting: Internal Medicine

## 2022-03-25 DIAGNOSIS — E785 Hyperlipidemia, unspecified: Secondary | ICD-10-CM

## 2022-03-30 ENCOUNTER — Encounter (HOSPITAL_BASED_OUTPATIENT_CLINIC_OR_DEPARTMENT_OTHER): Payer: Medicare Other | Admitting: General Surgery

## 2022-03-30 DIAGNOSIS — L97822 Non-pressure chronic ulcer of other part of left lower leg with fat layer exposed: Secondary | ICD-10-CM | POA: Diagnosis not present

## 2022-03-30 DIAGNOSIS — L97512 Non-pressure chronic ulcer of other part of right foot with fat layer exposed: Secondary | ICD-10-CM | POA: Diagnosis not present

## 2022-03-30 DIAGNOSIS — L97812 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d: Secondary | ICD-10-CM | POA: Diagnosis not present

## 2022-03-30 DIAGNOSIS — L03115 Cellulitis of right lower limb: Secondary | ICD-10-CM | POA: Diagnosis not present

## 2022-03-30 DIAGNOSIS — I890 Lymphedema, not elsewhere classified: Secondary | ICD-10-CM | POA: Diagnosis not present

## 2022-03-30 DIAGNOSIS — E11621 Type 2 diabetes mellitus with foot ulcer: Secondary | ICD-10-CM | POA: Diagnosis not present

## 2022-03-30 NOTE — Progress Notes (Addendum)
[name] ([id number]) [id number] Page 1 of 11 Visit Report for 03/30/2022 Chief Complaint Document Details Patient Name: Date of Service: [name]. 03/30/2022 1:45 PM Medical Record Number: [id number] Patient Account Number: [id number] Date of Birth/Sex: Treating RN: 06-Aug-1945 (76 y.o. M) Primary Care Provider: [name] Other Clinician: Referring Provider: Treating Provider/Extender: [name] in Treatment: 6 Information Obtained from: Patient Chief Complaint Patients presents for treatment of open ulcers secondary to lymphedema and diabetes Electronic Signature(s) Signed: 03/30/2022 3:29:32 PM By: [name] MD FACS Entered By: [name] on 03/30/2022 15:29:32 -------------------------------------------------------------------------------- Debridement Details Patient Name: Date of Service: [name] [name]. 03/30/2022 1:45 PM Medical Record Number: [id number] Patient Account Number: [id number] Date of Birth/Sex: Treating RN: 10-13-1945 (76 y.o. Collene Gobble Primary Care Provider: [name] Other Clinician: Referring Provider: Treating Provider/Extender: [name] in Treatment: 6 Debridement Performed for Assessment: Wound #4 Left,Anterior Lower Leg Performed By: Physician [name], MD Debridement Type: Debridement Level of Consciousness (Pre-procedure): Awake and Alert Pre-procedure Verification/Time Out Yes - 15:24 Taken: Start Time: 15:24 Pain Control: Lidocaine 5% topical ointment T Area Debrided (L x W): otal 6 (cm) x 3.5 (cm) = 21 (cm) Tissue and other material debrided: Non-Viable, Eschar, Slough, Slough Level: Non-Viable Tissue Debridement Description: Selective/Open Wound Instrument: Curette Bleeding: Minimum Hemostasis Achieved: Pressure End Time: 15:25 Procedural Pain: 0 Post Procedural Pain: 0 Response to Treatment:  Procedure was tolerated well Level of Consciousness (Post- Awake and Alert procedure): Post Debridement Measurements of Total Wound Length: (cm) 6 Width: (cm) 3.5 Depth: (cm) 0.1 Volume: (cm) 1.649 Character of Wound/Ulcer Post Debridement: Improved Post Procedure Diagnosis [name] [id number] Page 2 of 11 Same as Pre-procedure Notes Scribed for Dr. Celine Ahr by J.Scotton Electronic Signature(s) Signed: 03/30/2022 4:02:50 PM By: [name] MD FACS Signed: 03/31/2022 7:34:37 AM By: Dellie Catholic RN Entered By: Dellie Catholic on 03/30/2022 15:29:20 -------------------------------------------------------------------------------- Debridement Details Patient Name: Date of Service: [name], [name]. 03/30/2022 1:45 PM Medical Record Number: [id number] Patient Account Number: [id number] Date of Birth/Sex: Treating RN: 1945/08/05 (76 y.o. Collene Gobble Primary Care Provider: [name] Other Clinician: Referring Provider: Treating Provider/Extender: [name] in Treatment: 6 Debridement Performed for Assessment: Wound #5 Right,Anterior Lower Leg Performed By: Physician [name], MD Debridement Type: Debridement Level of Consciousness (Pre-procedure): Awake and Alert Pre-procedure Verification/Time Out Yes - 15:24 Taken: Start Time: 15:24 Pain Control: Lidocaine 5% topical ointment T Area Debrided (L x W): otal 1 (cm) x 0.5 (cm) = 0.5 (cm) Tissue and other material debrided: Non-Viable, Eschar, Slough, Slough Level: Non-Viable Tissue Debridement Description: Selective/Open Wound Instrument: Curette Bleeding: Minimum Hemostasis Achieved: Pressure End Time: 15:25 Procedural Pain: 0 Post Procedural Pain: 0 Response to Treatment: Procedure was tolerated well Level of Consciousness (Post- Awake and Alert procedure): Post Debridement Measurements of Total Wound Length: (cm)  1 Width: (cm) 0.5 Depth: (cm) 0.1 Volume: (cm) 0.039 Character of Wound/Ulcer Post Debridement: Improved Post Procedure Diagnosis Same as Pre-procedure Notes Scribed for Dr. Celine Ahr by J.Scotton Electronic Signature(s) Signed: 03/30/2022 4:02:50 PM By: [name] MD FACS Signed: 03/31/2022 7:34:37 AM By: Dellie Catholic RN Entered By: Dellie Catholic on 03/30/2022 15:30:41 [place], [place] ([id number]) [id number] Page 3 of 11 -------------------------------------------------------------------------------- HPI Details Patient Name: Date of Service: [place]. 03/30/2022 1:45 PM Medical Record Number: [id number] Patient Account Number: [id number] Date of Birth/Sex: Treating RN: 06/21/1945 (76 y.o. M) Primary Care Provider:  [name] Other Clinician: Referring Provider: Treating Provider/Extender: [name] in Treatment: 6 History of Present Illness HPI Description: ADMISSION 02/13/2022 This is [name] 76 year old morbidly obese type II diabetic (last hemoglobin A1c 6.6%) who presented to his primary care provider's office on November 7 with an ulcer on the top of his foot. He said it had been there for about [name] week. He is insensate in his feet and his wife had been caring for it. His PCP took [name] swab from the wound which grew back strep. [name] was prescribed and he was referred to the wound care center for further evaluation and management. In addition to diabetes, he also has pulmonary hypertension and diastolic congestive heart failure. 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 He has been seen at the voice and swallowing disorders clinic in [place] and no further intervention has been felt necessary. On exam, he has severe, at least stage II, lymphedema. In addition to the dorsal foot wound, he has ulcers on both lower legs. He has [name] left anterior lower  leg, [name] right lateral lower leg, and [name] right anterior lower leg wound. ABIs in clinic were noncompressible. 02/22/2022: The right lateral leg wound has closed. The other wounds are about the same size to slightly smaller. All of them have [name] thick layer of rubbery slough on the surface. Edema control is still suboptimal. 03/02/2022: The left anterior leg wound has deteriorated. His leg is more swollen and red. There is thick slough accumulation on the surface. The right dorsal foot wound is smaller, but also has substantial slough buildup. 03/09/2022: The PCR culture that I took last week was positive for Pseudomonas. Levofloxacin was prescribed and he has [name] few days left of this. His leg looks better and the wound is cleaner. The right dorsal foot wound has [name] very thick layer of slough on the surface. Both wounds are smaller. 03/30/2022: All of the wounds are smaller today and very superficial. The dorsal foot wound on the right is nearly closed. There is just some slough and eschar present. Electronic Signature(s) Signed: 03/30/2022 3:30:06 PM By: [name] MD FACS Entered By: [name] on 03/30/2022 15:30:06 -------------------------------------------------------------------------------- Physical Exam Details Patient Name: Date of Service: [place] [name]. 03/30/2022 1:45 PM Medical Record Number: [id number] Patient Account Number: [id number] Date of Birth/Sex: Treating RN: [birth year] (76 y.o. M) Primary Care Provider: [name] Other Clinician: Referring Provider: Treating Provider/Extender: [name] in Treatment: 6 Constitutional . Slightly bradycardic, asymptomatic. . . No acute distress. Respiratory Normal work of breathing on supplemental oxygen. Cardiovascular 3+ pitting edema. Notes 03/30/2022: All of the wounds are smaller today and very superficial. The dorsal foot wound on the right is nearly closed. There is just some slough  and eschar present. Electronic Signature(s) Signed: 03/30/2022 3:30:56 PM By: [name] MD FACS Entered By: [name] on 03/30/2022 15:30:56 [place], [name] ([id number]) [id number] Page 4 of 11 -------------------------------------------------------------------------------- Physician Orders Details Patient Name: Date of Service: [name], Kennth [name]. 03/30/2022 1:45 PM Medical Record Number: [id number] Patient Account Number: [id number] Date of Birth/Sex: Treating RN: 1946-02-06 (76 y.o. Collene Gobble Primary Care Provider: [name] Other Clinician: Referring Provider: Treating Provider/Extender: [name] in Treatment: 6 Verbal / Phone Orders: No Diagnosis Coding ICD-10 Coding Code Description [phone] Non-pressure chronic ulcer of other part of right foot with fat layer exposed L97.812 Non-pressure  chronic ulcer of other part of right lower leg with fat layer exposed L97.822 Non-pressure chronic ulcer of other part of left lower leg with fat layer exposed I50.30 Unspecified diastolic (congestive) heart failure J96.11 Chronic respiratory failure with hypoxia L03.115 Cellulitis of right lower limb E11.621 Type 2 diabetes mellitus with foot ulcer E66.01 Morbid (severe) obesity due to excess calories I89.0 Lymphedema, not elsewhere classified Follow-up Appointments ppointment in 1 week. - Dr. Celine Ahr - Room 2 Return [name] Nurse Visit: - If no Wound care visit possible for next week-[name] nurse visit if there is an appointment next week. Anesthetic (In clinic) Topical Lidocaine 4% applied to wound bed Edema Control - Lymphedema / SCD / Other Avoid standing for long periods of time. Exercise regularly Wound Treatment Wound #1 - Foot Wound Laterality: Dorsal, Right Cleanser: Soap and Water 1 x Per Week/30 Days Discharge Instructions: May shower and wash wound with dial antibacterial soap and water prior to  dressing change. Cleanser: Wound Cleanser 1 x Per Week/30 Days Discharge Instructions: Cleanse the wound with wound cleanser prior to applying [name] clean dressing using gauze sponges, not tissue or cotton balls. Peri-Wound Care: Sween Lotion (Moisturizing lotion) 1 x Per Week/30 Days Discharge Instructions: Apply moisturizing lotion as directed Prim Dressing: Sorbalgon AG Dressing 2x2 (in/in) 1 x Per Week/30 Days ary Discharge Instructions: Apply to wound bed as instructed Secondary Dressing: Woven Gauze Sponge, Non-Sterile 4x4 in 1 x Per Week/30 Days Discharge Instructions: Apply over primary dressing as directed. Secondary Dressing: Zetuvit Plus 4x8 in 1 x Per Week/30 Days Discharge Instructions: Apply over primary dressing as directed. Secured With: Transpore Surgical Tape, 2x10 (in/yd) 1 x Per Week/30 Days Discharge Instructions: Secure dressing with tape as directed. Wound #4 - Lower Leg Wound Laterality: Left, Anterior Cleanser: Soap and Water 1 x Per Week/30 Days Discharge Instructions: May shower and wash wound with dial antibacterial soap and water prior to dressing change. Cleanser: Wound Cleanser 1 x Per Week/30 Days Discharge Instructions: Cleanse the wound with wound cleanser prior to applying [name] clean dressing using gauze sponges, not tissue or cotton balls. Peri-Wound Care: Sween Lotion (Moisturizing lotion) 1 x Per Week/30 Days Discharge Instructions: Apply moisturizing lotion as directed Paff, [name] ([id number]) [phone].pdf Page 5 of 11 Topical: Mupirocin Ointment 1 x Per Week/30 Days Discharge Instructions: Apply Mupirocin (Bactroban) as instructed Prim Dressing: Sorbalgon AG Dressing, 4x4 (in/in) 1 x Per Week/30 Days ary Discharge Instructions: Apply to wound bed as instructed Secondary Dressing: Woven Gauze Sponge, Non-Sterile 4x4 in 1 x Per Week/30 Days Discharge Instructions: Apply over primary dressing as directed. Secondary Dressing:  Zetuvit Plus 4x8 in 1 x Per Week/30 Days Discharge Instructions: Apply over primary dressing as directed. Secured With: Transpore Surgical Tape, 2x10 (in/yd) 1 x Per Week/30 Days Discharge Instructions: Secure dressing with tape as directed. Compression Wrap: ThreePress (3 layer compression wrap) 1 x Per Week/30 Days Discharge Instructions: Apply three layer compression as directed. Compression Stockings: Jobst Farrow Wrap 4000 Left Leg Compression Amount: 30-40 mmHG Discharge Instructions: Apply Francia Greaves daily as instructed. Apply first thing in the morning, remove at night before bed. Electronic Signature(s) Signed: 03/30/2022 4:02:50 PM By: [name] MD FACS Signed: 03/31/2022 7:34:37 AM By: Dellie Catholic RN Entered By: Dellie Catholic on 03/30/2022 15:36:13 -------------------------------------------------------------------------------- Problem List Details Patient Name: Date of Service: [place] [name] [name]. 03/30/2022 1:45 PM Medical Record Number: [id number] Patient Account Number: [id number] Date of Birth/Sex: Treating RN: 09/15/1945 (76 y.o. M) Primary Care Provider: [name]  Other Clinician: Referring Provider: Treating Provider/Extender: [name] in Treatment: 6 Active Problems ICD-10 Encounter Code Description Active Date MDM Diagnosis L97.512 Non-pressure chronic ulcer of other part of right foot with fat layer exposed 02/13/2022 No Yes L97.812 Non-pressure chronic ulcer of other part of right lower leg with fat layer 02/13/2022 No Yes exposed L97.822 Non-pressure chronic ulcer of other part of left lower leg with fat layer exposed11/13/2023 No Yes I50.30 Unspecified diastolic (congestive) heart failure 02/13/2022 No Yes J96.11 Chronic respiratory failure with hypoxia 02/13/2022 No Yes L03.115 Cellulitis of right lower limb 02/13/2022 No Yes [name] ([id number]) [id number] Page 6 of  11 E11.621 Type 2 diabetes mellitus with foot ulcer 02/13/2022 No Yes E66.01 Morbid (severe) obesity due to excess calories 02/13/2022 No Yes I89.0 Lymphedema, not elsewhere classified 02/13/2022 No Yes Inactive Problems Resolved Problems Electronic Signature(s) Signed: 03/30/2022 3:29:17 PM By: [name] MD FACS Entered By: [name] on 03/30/2022 15:29:17 -------------------------------------------------------------------------------- Progress Note Details Patient Name: Date of Service: [place], [name]. 03/30/2022 1:45 PM Medical Record Number: [id number] Patient Account Number: [id number] Date of Birth/Sex: Treating RN: 1945-10-13 (76 y.o. M) Primary Care Provider: [name] Other Clinician: Referring Provider: Treating Provider/Extender: [name] in Treatment: 6 Subjective Chief Complaint Information obtained from Patient Patients presents for treatment of open ulcers secondary to lymphedema and diabetes History of Present Illness (HPI) ADMISSION 02/13/2022 This is [name] 76 year old morbidly obese type II diabetic (last hemoglobin A1c 6.6%) who presented to his primary care provider's office on November 7 with an ulcer on the top of his foot. He said it had been there for about [name] week. He is insensate in his feet and his wife had been caring for it. His PCP took [name] swab from the wound which grew back strep. [name] was prescribed and he was referred to the wound care center for further evaluation and management. In addition to diabetes, he also has pulmonary hypertension and diastolic congestive heart failure. 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 He has been seen at the voice and swallowing disorders clinic in [place] and no further intervention has been felt necessary. On exam, he has severe, at least stage II, lymphedema. In addition to the dorsal foot wound, he has ulcers on  both lower legs. He has [name] left anterior lower leg, [name] right lateral lower leg, and [name] right anterior lower leg wound. ABIs in clinic were noncompressible. 02/22/2022: The right lateral leg wound has closed. The other wounds are about the same size to slightly smaller. All of them have [name] thick layer of rubbery slough on the surface. Edema control is still suboptimal. 03/02/2022: The left anterior leg wound has deteriorated. His leg is more swollen and red. There is thick slough accumulation on the surface. The right dorsal foot wound is smaller, but also has substantial slough buildup. 03/09/2022: The PCR culture that I took last week was positive for Pseudomonas. Levofloxacin was prescribed and he has [name] few days left of this. His leg looks better and the wound is cleaner. The right dorsal foot wound has [name] very thick layer of slough on the surface. Both wounds are smaller. 03/30/2022: All of the wounds are smaller today and very superficial. The dorsal foot wound on the right is nearly closed. There is just some slough and eschar present. Patient History Information obtained from Patient. Family History Cancer - Paternal Grandparents, Heart  Disease - Maternal Grandparents,Father, No family history of Diabetes, Hereditary Spherocytosis, Hypertension, Kidney Disease, Lung Disease, Seizures, Stroke, Thyroid Problems, Tuberculosis. Social History Former smoker - smoked as [name] teen, Marital Status - Married, Alcohol Use - Never, Drug Use - No History, Caffeine Use - Never. Medical History Hematologic/Lymphatic IMRAAN, WENDELL [name] ([id number]) [id number] Page 7 of 11 Patient has history of Anemia, Lymphedema Respiratory Patient has history of Sleep Apnea Cardiovascular Patient has history of Arrhythmia - PAF, Congestive Heart Failure - diastolic, Hypertension Endocrine Patient has history of Type II Diabetes - takes no meds, "borderline A1c" Neurologic Patient has history  of Neuropathy Oncologic Denies history of Received Chemotherapy, Received Radiation Hospitalization/Surgery History - right and left heart cath. - colonoscopy. - esophagogastroduodenoscopy. - subxyphoid pericardial window. - intraoperative transesophageal echocardiogram. - laparoscopic gastric banding with hiatal hernia repair. - breath tek h pylori. - cholecysectomy. - arthroscopy knee w/ drilling. - lithotripsy. - tonsillectomy. - total knee replacement. - trigger finger repair. - wisdom tooth extraction. Medical [name] Surgical History Notes nd Constitutional Symptoms (General Health) obsesity Hematologic/Lymphatic chronic anticoagulation Cardiovascular pericarditis Gastrointestinal pt says 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 but they could not prove it Genitourinary BPH Neurologic 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, embolic cerebral infarction, CVA Psychiatric depression Objective Constitutional Slightly bradycardic, asymptomatic. No acute distress. Vitals Time Taken: 2:45 PM, Height: 72 in, Weight: 375 lbs, BMI: 50.9, Temperature: 98.5 F, Pulse: 59 bpm, Respiratory Rate: 22 breaths/min, Blood Pressure: 123/83 mmHg, Capillary Blood Glucose: 97 mg/dl, Pulse Oximetry: 95 %. Respiratory Normal work of breathing on supplemental oxygen. Cardiovascular 3+ pitting edema. General Notes: 03/30/2022: All of the wounds are smaller today and very superficial. The dorsal foot wound on the right is nearly closed. There is just some slough and eschar present. Integumentary (Hair, Skin) Wound #1 status is Open. Original cause of wound was Blister. The date acquired was: 01/31/2022. The wound has been in treatment 6 weeks. The wound is located on the Right,Dorsal Foot. The wound measures 0.1cm length x 0.1cm width x 0.1cm depth; 0.008cm^2 area and 0.001cm^3 volume. There is no tunneling or undermining noted. There is [name] none present amount of drainage noted. The wound margin is distinct with  the outline attached to the wound base. There is large (67-100%) red granulation within the wound bed. There is no necrotic tissue within the wound bed. The periwound skin appearance had no abnormalities noted for texture. The periwound skin appearance exhibited: Dry/Scaly, Hemosiderin Staining. The periwound skin appearance did not exhibit: Maceration, Erythema. Periwound temperature was noted as No Abnormality. Wound #4 status is Open. Original cause of wound was Blister. The date acquired was: 02/13/2021. The wound has been in treatment 6 weeks. The wound is located on the Left,Anterior Lower Leg. The wound measures 6cm length x 3.5cm width x 0.1cm depth; 16.493cm^2 area and 1.649cm^3 volume. There is Fat Layer (Subcutaneous Tissue) exposed. There is no tunneling or undermining noted. There is [name] medium amount of serous drainage noted. The wound margin is distinct with the outline attached to the wound base. There is medium (34-66%) red, pink granulation within the wound bed. There is [name] medium (34-66%) amount of necrotic tissue within the wound bed including Eschar and Adherent Slough. The periwound skin appearance had no abnormalities noted for texture. The periwound skin appearance exhibited: Hemosiderin Staining, Erythema. The periwound skin appearance did not exhibit: Dry/Scaly. The surrounding wound skin color is noted with erythema. Periwound temperature was noted as No Abnormality. Wound #5 status is Open.  Original cause of wound was Gradually Appeared. The date acquired was: 03/30/2022. The wound is located on the Right,Anterior Lower Leg. The wound measures 1cm length x 0.5cm width x 0.1cm depth; 0.393cm^2 area and 0.039cm^3 volume. There is no tunneling or undermining noted. There is [name] medium amount of serosanguineous drainage noted. The wound margin is distinct with the outline attached to the wound base. There is large (67- 100%) red granulation within the wound bed. There is [name] small  (1-33%) amount of necrotic tissue within the wound bed including Adherent Slough. The periwound skin appearance had no abnormalities noted for texture. The periwound skin appearance had no abnormalities noted for moisture. The periwound skin appearance exhibited: Hemosiderin Staining. Periwound temperature was noted as No Abnormality. 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 There was [name] Selective/Open Wound Non-Viable Tissue Debridement with [name] total area of 21 sq cm performed by [name], MD. With the following instrument(s): Curette to remove Non-Viable tissue/material. Material removed includes Eschar and Slough and after achieving pain control using Lidocaine 5% topical ointment. No specimens were taken. [name] time out was conducted at 15:24, prior to the start of the procedure. [name] Minimum amount of bleeding was controlled with Pressure. The procedure was tolerated well with [name] pain level of 0 throughout and [name] pain level of 0 following the procedure. Post Debridement Measurements: 6cm length x 3.5cm width x 0.1cm depth; 1.649cm^3 volume. Character of Wound/Ulcer Post Debridement is improved. Post procedure Diagnosis Wound #4: Same as Pre-Procedure General Notes: Scribed for Dr. Celine Ahr by  J.Scotton. Wound #5 Pre-procedure diagnosis of Wound #5 is [name] Lymphedema located on the Right,Anterior Lower Leg . There was [name] Selective/Open Wound Non-Viable Tissue Debridement with [name] total area of 0.5 sq cm performed by [name], MD. With the following instrument(s): Curette to remove Non-Viable tissue/material. Material removed includes Eschar and Slough and after achieving pain control using Lidocaine 5% topical ointment. No specimens were taken. [name] time out was conducted at 15:24, prior to the start of the procedure. [name] Minimum amount of bleeding was controlled with Pressure. The procedure was tolerated well with [name] pain level of 0 throughout and [name] pain level of 0 following the procedure. Post Debridement Measurements: 1cm length x 0.5cm width x 0.1cm depth; 0.039cm^3 volume. Character of Wound/Ulcer Post Debridement is improved. Post procedure Diagnosis Wound #5: Same as Pre-Procedure General Notes: Scribed for Dr. Celine Ahr by J.[email or web address]. Plan Follow-up Appointments: Return Appointment in 1 week. - Dr. Celine Ahr - Room 2 Nurse Visit: - If no Wound care visit possible for next week-[name] nurse visit if there is an appointment next week. Anesthetic: (In clinic) Topical Lidocaine 4% applied to wound bed Edema Control - Lymphedema / SCD / Other: Avoid standing for long periods of time. Exercise regularly WOUND #1: - Foot Wound Laterality: Dorsal, Right Cleanser: Soap and Water 1 x Per Week/30 Days Discharge Instructions: May shower and wash wound  with dial antibacterial soap and water prior to dressing change. Cleanser: Wound Cleanser 1 x Per Week/30 Days Discharge Instructions: Cleanse the wound with wound cleanser prior to applying [name] clean dressing using gauze sponges, not tissue or cotton balls. Peri-Wound Care: Sween Lotion (Moisturizing lotion) 1 x Per Week/30 Days Discharge Instructions: Apply moisturizing lotion as directed Prim Dressing: Sorbalgon AG Dressing 2x2 (in/in) 1 x Per  Week/30 Days ary Discharge Instructions: Apply to wound bed as instructed Secondary Dressing: Woven Gauze Sponge, Non-Sterile 4x4 in 1 x Per Week/30 Days Discharge Instructions: Apply over primary dressing as directed. Secondary Dressing: Zetuvit Plus 4x8 in 1 x Per Week/30 Days Discharge Instructions: Apply over primary dressing as directed. Secured With: Transpore Surgical T ape, 2x10 (in/yd) 1 x Per Week/30 Days Discharge Instructions: Secure dressing with tape as directed. WOUND #4: - Lower Leg Wound Laterality: Left, Anterior Cleanser: Soap and Water 1 x Per Week/30 Days Discharge Instructions: May shower and wash wound with dial antibacterial soap and water prior to dressing change. Cleanser: Wound Cleanser 1 x Per Week/30 Days Discharge Instructions: Cleanse the wound with wound cleanser prior to applying [name] clean dressing using gauze sponges, not tissue or cotton balls. Peri-Wound Care: Sween Lotion (Moisturizing lotion) 1 x Per Week/30 Days Discharge Instructions: Apply moisturizing lotion as directed Topical: Mupirocin Ointment 1 x Per Week/30 Days Discharge Instructions: Apply Mupirocin (Bactroban) as instructed Prim Dressing: Sorbalgon AG Dressing, 4x4 (in/in) 1 x Per Week/30 Days ary Discharge Instructions: Apply to wound bed as instructed Secondary Dressing: Woven Gauze Sponge, Non-Sterile 4x4 in 1 x Per Week/30 Days Discharge Instructions: Apply over primary dressing as directed. Secondary Dressing: Zetuvit Plus 4x8 in 1 x Per Week/30 Days Discharge Instructions: Apply over primary dressing as directed. Secured With: Transpore Surgical T ape, 2x10 (in/yd) 1 x Per Week/30 Days Discharge Instructions: Secure dressing with tape as directed. Com pression Wrap: ThreePress (3 layer compression wrap) 1 x Per Week/30 Days Discharge Instructions: Apply three layer compression as directed. Com pression Stockings: [place] [name] ([id number])  [id number] Page 9 of 11 Compression Amount: 30-40 mmHg (left) Discharge Instructions: Apply Francia Greaves daily as instructed. Apply first thing in the morning, remove at night before bed. 03/30/2022: All of the wounds are smaller today and very superficial. The dorsal foot wound on the right is nearly closed. There is just some slough and eschar present. I used [name] curette to debride slough and eschar off of both lower leg wounds; the dorsal foot wound did not require any debridement. We will continue silver alginate and 3 layer compression. Follow-up in 1 week. Electronic Signature(s) Signed: 03/31/2022 8:34:33 AM By: [name] MD FACS Previous Signature: 03/30/2022 3:31:38 PM Version By: [name] MD FACS Entered By: [name] on 03/31/2022 08:34:32 -------------------------------------------------------------------------------- HxROS Details Patient Name: Date of Service: [name], Kaliq [name]. 03/30/2022 1:45 PM Medical Record Number: [id number] Patient Account Number: [id number] Date of Birth/Sex: Treating RN: 01/01/1946 (76 y.o. M) Primary Care Provider: [name] Other Clinician: Referring Provider: Treating Provider/Extender: [name] in Treatment: 6 Information Obtained From Patient Constitutional Symptoms (General Health) Medical History: Past Medical History Notes: obsesity Hematologic/Lymphatic Medical History: Positive for: Anemia; Lymphedema Past Medical History Notes: chronic anticoagulation Respiratory Medical History: Positive for: Sleep Apnea Cardiovascular Medical History: Positive for: Arrhythmia - PAF; Congestive Heart Failure - diastolic; Hypertension Past Medical History Notes: pericarditis Gastrointestinal Medical History: Past Medical History Notes: pt says the doctor told him he had an  unknown type of hepatitis but they could not prove it Endocrine Medical  History: Positive for: Type II Diabetes - takes no meds, "borderline A1c" Time with diabetes: 2-3 years ago Blood sugar tested every day: No Genitourinary [name] ([id number]) [id number] Page 10 of 11 Medical History: Past Medical History Notes: BPH Neurologic Medical History: Positive for: Neuropathy Past Medical History Notes: 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, embolic cerebral infarction, CVA Oncologic Medical History: Negative for: Received Chemotherapy; Received Radiation Psychiatric Medical History: Past Medical History Notes: depression Immunizations Pneumococcal Vaccine: Received Pneumococcal Vaccination: Yes Received Pneumococcal Vaccination On or After 60th Birthday: Yes Implantable Devices None Hospitalization / Surgery History Type of Hospitalization/Surgery right and left heart cath colonoscopy esophagogastroduodenoscopy subxyphoid pericardial window intraoperative transesophageal echocardiogram laparoscopic gastric banding with hiatal hernia repair breath tek h pylori cholecysectomy arthroscopy knee w/ drilling lithotripsy tonsillectomy total knee replacement trigger finger repair wisdom tooth extraction Family and Social History Cancer: Yes - Paternal Grandparents; Diabetes: No; Heart Disease: Yes - Maternal Grandparents,Father; Hereditary Spherocytosis: No; Hypertension: No; Kidney Disease: No; Lung Disease: No; Seizures: No; Stroke: No; Thyroid Problems: No; Tuberculosis: No; Former smoker - smoked as [name] teen; Marital Status - Married; Alcohol Use: Never; Drug Use: No History; Caffeine Use: Never; Financial Concerns: No; Food, Clothing or Shelter Needs: No; Support System Lacking: No; Transportation Concerns: No Electronic Signature(s) Signed: 03/30/2022 4:02:50 PM By: [name] MD FACS Entered By: [name] on 03/30/2022  15:30:12 -------------------------------------------------------------------------------- SuperBill Details Patient Name: Date of Service: [name], Favian [name]. 03/30/2022 Medical Record Number: [id number] Patient Account Number: [id number] Date of Birth/Sex: Treating RN: [birth year] (76 y.o. M) Primary Care Provider: [name] Other Clinician: Referring Provider: Treating Provider/Extender: [name] in Treatment: 6 Diagnosis Coding [name] ([id number]) [id number] Page 11 of 11 ICD-10 Codes Code Description [phone] Non-pressure chronic ulcer of other part of right foot with fat layer exposed L97.812 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d L97.822 Non-pressure chronic ulcer of other part of left lower leg with fat layer exposed I50.30 Unspecified diastolic (congestive) heart failure J96.11 Chronic respiratory failure with hypoxia L03.115 Cellulitis of right lower limb E11.621 Type 2 diabetes mellitus with foot ulcer E66.01 Morbid (severe) obesity due to excess calories I89.0 Lymphedema, not elsewhere classified Facility Procedures : CPT4 Code: [id number] Description: 97597 - DEBRIDE WOUND 1ST 20 SQ CM OR < ICD-10 Diagnosis Description L97.812 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 L97.822 Non-pressure chronic ulcer of other part of left lower leg with fat layer  exposed Modifier: d Quantity: 1 : CPT4 Code: [id number] Description: 35597 - DEBRIDE WOUND EA ADDL 20 SQ CM ICD-10 Diagnosis Description L97.812 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 L97.822 Non-pressure chronic ulcer of other part of left lower leg with fat layer  exposed Modifier: d Quantity: 1 Physician Procedures : CPT4 Code Description Modifier [id number] - WC PHYS LEVEL 3 - EST PT 25 ICD-10 Diagnosis Description L97.512  Non-pressure chronic ulcer of other part of right foot with fat layer exposed L97.812 Non-pressure chronic ulcer of other part of right  lower leg with fat layer exposed L97.822 Non-pressure chronic ulcer of other part of left lower leg with fat layer exposed I89.0 Lymphedema, not elsewhere classified Quantity: 1 : [id number] 21224 - WC PHYS DEBR WO ANESTH 20 SQ CM ICD-10 Diagnosis Description L97.812 Non-pressure chronic ulcer of other part of right lower  leg with fat layer exposed L97.822 Non-pressure chronic ulcer of other part of left lower leg with fat layer  exposed Quantity: 1 : [id number] 88337 - WC PHYS DEBR WO ANESTH EA ADD 20 CM ICD-10 Diagnosis Description L97.812 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d L97.822 Non-pressure chronic ulcer of other part of left lower leg with fat  layer exposed Quantity: 1 Electronic Signature(s) Signed: 03/30/2022 3:32:06 PM By: [name] MD FACS Entered By: [name] on 03/30/2022 15:32:06

## 2022-03-31 ENCOUNTER — Other Ambulatory Visit: Payer: Self-pay | Admitting: Internal Medicine

## 2022-03-31 DIAGNOSIS — H1010 Acute atopic conjunctivitis, unspecified eye: Secondary | ICD-10-CM

## 2022-03-31 DIAGNOSIS — K2100 Gastro-esophageal reflux disease with esophagitis, without bleeding: Secondary | ICD-10-CM

## 2022-03-31 NOTE — Progress Notes (Signed)
[name], [name] ([id number]) [id number] Page 1 of 11 Visit Report for 03/30/2022 Arrival Information Details Patient Name: Date of Service: [place]. 03/30/2022 1:45 PM Medical Record Number: [id number] Patient Account Number: [id number] Date of Birth/Sex: Treating RN: [birth year] (76 y.o. Waldron Session Primary Care Dare Sanger: [name] Other Clinician: Referring Abie Killian: Treating Enedina Pair/Extender: [name] in Treatment: 6 Visit Information History Since Last Visit Added or deleted any medications: No Patient Arrived: Wheel Chair Any new allergies or adverse reactions: No Arrival Time: 14:55 Had [name] fall or experienced change in No Accompanied By: wife activities of daily living that may affect Transfer Assistance: Manual risk of falls: Patient Requires Transmission-Based Precautions: No Signs or symptoms of abuse/neglect since last visito No Patient Has Alerts: Yes Hospitalized since last visit: No Patient Alerts: Patient on Blood Thinner Implantable device outside of the clinic excluding No cellular tissue based products placed in the center since last visit: Has Compression in Place as Prescribed: Yes Pain Present Now: Yes Electronic Signature(s) Signed: 03/31/2022 12:15:24 PM By: Blanche East RN Entered By: Blanche East on 03/30/2022 14:55:40 -------------------------------------------------------------------------------- Compression Therapy Details Patient Name: Date of Service: [name], [name]. 03/30/2022 1:45 PM Medical Record Number: [id number] Patient Account Number: [id number] Date of Birth/Sex: Treating RN: [birth year] (76 y.o. Collene Gobble Primary Care Markiesha Delia: [name] Other Clinician: Referring Shiesha Jahn: Treating Analese Sovine/Extender: [name] in Treatment: 6 Compression Therapy Performed for Wound Assessment: Wound #4 Left,Anterior Lower  Leg Performed By: Clinician Dellie Catholic, RN Compression Type: Three Layer Post Procedure Diagnosis Same as Pre-procedure Notes Unna at the top of the leg Electronic Signature(s) Signed: 03/31/2022 7:34:37 AM By: Dellie Catholic RN Entered By: Dellie Catholic on 03/30/2022 15:31:15 Novitski, Horton [name] ([id number]) [id number] Page 2 of 11 -------------------------------------------------------------------------------- Compression Therapy Details Patient Name: Date of Service: [name], [name]. 03/30/2022 1:45 PM Medical Record Number: [id number] Patient Account Number: [id number] Date of Birth/Sex: Treating RN: 1946-03-05 (76 y.o. Collene Gobble Primary Care Simaya Lumadue: [name] Other Clinician: Referring Waldron Gerry: Treating Jhonny Calixto/Extender: [name] in Treatment: 6 Compression Therapy Performed for Wound Assessment: Wound #5 Right,Anterior Lower Leg Performed By: Clinician Dellie Catholic, RN Compression Type: Three Layer Post Procedure Diagnosis Same as Pre-procedure Notes Unna at the top of the leg Electronic Signature(s) Signed: 03/31/2022 7:34:37 AM By: Dellie Catholic RN Entered By: Dellie Catholic on 03/30/2022 15:31:15 -------------------------------------------------------------------------------- Encounter Discharge Information Details Patient Name: Date of Service: [name], [name]. 03/30/2022 1:45 PM Medical Record Number: [id number] Patient Account Number: [id number] Date of Birth/Sex: Treating RN: 05-01-1945 (76 y.o. Collene Gobble Primary Care Mileena Rothenberger: [name] Other Clinician: Referring Tanette Chauca: Treating Grason Brailsford/Extender: [name] in Treatment: 6 Encounter Discharge Information Items Post Procedure Vitals Discharge Condition: Stable Temperature (F): 98.5 Ambulatory Status: Wheelchair Pulse (bpm): 59 Discharge Destination: Home Respiratory Rate  (breaths/min): 22 Transportation: Private Auto Blood Pressure (mmHg): 123/83 Accompanied By: self Schedule Follow-up Appointment: Yes Clinical Summary of Care: Patient Declined Electronic Signature(s) Signed: 03/31/2022 7:34:37 AM By: Dellie Catholic RN Entered By: Dellie Catholic on 03/31/2022 07:34:15 -------------------------------------------------------------------------------- Lower Extremity Assessment Details Patient Name: Date of Service: [name], [name]. 03/30/2022 1:45 PM Medical Record Number: [id number] Patient Account Number: [id number] Date of Birth/Sex: Treating RN: Aug 12, 1945 (76 y.o. Waldron Session Primary Care Alcee Sipos: [name] Other Clinician: Referring Shawnie Nicole: Treating Ruhee Enck/Extender: Eyvonne Mechanic, [place] ([id number]) [phone].pdf Page 3 of 11 Weeks in  Treatment: 6 Edema Assessment Assessed: [Left: No] [Right: No] [Left: Edema] [Right: :] Calf Left: Right: Point of Measurement: From Medial Instep 52 cm 54 cm Ankle Left: Right: Point of Measurement: From Medial Instep 31.2 cm 29.5 cm Vascular Assessment Pulses: Dorsalis Pedis Palpable: [Left:Yes] [Right:Yes] Electronic Signature(s) Signed: 03/31/2022 12:15:24 PM By: Blanche East RN Entered By: Blanche East on 03/30/2022 14:57:10 -------------------------------------------------------------------------------- Multi Wound Chart Details Patient Name: Date of Service: [name], [name]. 03/30/2022 1:45 PM Medical Record Number: [id number] Patient Account Number: [id number] Date of Birth/Sex: Treating RN: 1945/12/23 (76 y.o. 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 Lymphedema Primary Etiology: Extremity Anemia, Lymphedema, Sleep Apnea, Anemia, Lymphedema, Sleep Apnea, Anemia, Lymphedema, Sleep Apnea, Comorbid History: Arrhythmia, Congestive Heart Failure, Arrhythmia, Congestive Heart Failure, Arrhythmia, Congestive Heart Failure, Hypertension, Type II Diabetes, Hypertension, Type II Diabetes, Hypertension, Type II Diabetes, Neuropathy Neuropathy Neuropathy 01/31/2022 02/13/2021 03/30/2022 Date Acquired: 6 6 0 Weeks of Treatment: Open Open Open Wound Status: No No No Wound Recurrence: 0.1x0.1x0.1 6x3.5x0.1 1x0.5x0.1 Measurements L x W x D (cm) 0.008 16.493 0.393 [name] (cm) : rea Loma Newton, Ryder [name] ([id number]) [id number] Page 4 of 11 0.001 1.649 0.039 Volume (cm) : 99.90% -100.00% [name]/[name] % Reduction in [name] rea: 99.90% -99.90% [name]/[name] % Reduction in Volume: Grade 1 Full Thickness Without Exposed Full Thickness Without Exposed Classification: Support Structures Support Structures None Present Medium Medium Exudate [name] mount: [name]/[name] Serous Serosanguineous Exudate Type: [name]/[name] amber red, brown Exudate Color: Distinct, outline attached Distinct, outline attached Distinct, outline attached Wound Margin: Large (67-100%) Medium (34-66%) Large (67-100%) Granulation [name] mount: Red Red, Pink Red Granulation Quality: None Present (0%) Medium (34-66%) Small (1-33%) Necrotic [name] mount: [name]/[name] Eschar, Adherent Slough Adherent Slough Necrotic Tissue: Fascia: No Fat Layer (Subcutaneous Tissue): Yes Fascia: No Exposed Structures: Fat Layer (Subcutaneous Tissue): No Fascia: No Fat Layer (Subcutaneous Tissue): No Tendon: No Tendon: No Tendon: No Muscle: No Muscle: No Muscle: No Joint: No Joint: No Joint:  No Bone: No Bone: No Bone: No Large (67-100%) Small (1-33%) None Epithelialization: [name]/[name] Debridement - Selective/Open Wound Debridement - Selective/Open Wound Debridement: Pre-procedure Verification/Time Out [name]/[name] 15:24 15:24 Taken: [name]/[name] Lidocaine 5% topical ointment Lidocaine 5% topical ointment Pain Control: [name]/[name] Necrotic/Eschar, Psychologist, prison and probation services, Slough Tissue Debrided: [name]/[name] Non-Viable Tissue Non-Viable Tissue Level: [name]/[name] 21 0.5 Debridement [name] (sq cm): rea [name]/[name] Curette Curette Instrument: [name]/[name] Minimum Minimum Bleeding: [name]/[name] Pressure Pressure Hemostasis [name] chieved: [name]/[name] 0 0 Procedural Pain: [name]/[name] 0 0 Post Procedural Pain: [name]/[name] Procedure was tolerated well Procedure was tolerated well Debridement Treatment Response: [name]/[name] 6x3.5x0.1 1x0.5x0.1 Post Debridement Measurements L x W x D (cm) [name]/[name] 1.649 0.039 Post Debridement Volume: (cm) No Abnormalities Noted No Abnormalities Noted No Abnormalities Noted Periwound Skin Texture: Dry/Scaly: Yes Dry/Scaly: No No Abnormalities Noted Periwound Skin Moisture: Maceration: No Hemosiderin Staining: Yes Erythema: Yes Hemosiderin Staining: Yes Periwound Skin Color: Erythema: No Hemosiderin Staining: Yes No Abnormality No Abnormality No Abnormality Temperature: [name]/[name] [name]/[name] Clustered Assessment Notes: [name]/[name] Debridement [name]/[name] Procedures Performed: Treatment Notes Electronic Signature(s) Signed: 03/30/2022 3:29:23 PM By: [name] MD FACS Entered By: [name] on 03/30/2022 15:29:23 -------------------------------------------------------------------------------- Multi-Disciplinary Care Plan Details Patient Name: Date of  Service: [name], [name]. 03/30/2022 1:45 PM Medical Record Number: [id number] Patient Account Number: [id number] Date of Birth/Sex: Treating RN: [birth year] (76 y.o. Collene Gobble Primary Care Brittainy Bucker: [name] Other Clinician: Referring Sabiha Sura: Treating [place] Emmitt/Extender: [name] in Treatment: 6 Active Inactive Necrotic Tissue Nursing Diagnoses: Impaired tissue integrity related to necrotic/devitalized tissue Knowledge deficit related to management of necrotic/devitalized tissue Goals: Breckyn, Ticas Bright [name] ([id number]) [id number] Page 5 of 11 Necrotic/devitalized tissue will be minimized in the wound bed Date Initiated: 02/13/2022 Target Resolution Date: 04/07/2022 Goal Status: Active Patient/caregiver will verbalize understanding of reason and process for debridement of necrotic tissue Date Initiated: 02/13/2022 Target Resolution Date: 04/07/2022 Goal Status: Active Interventions: Assess patient pain level pre-, during and post procedure and prior to discharge Provide education on necrotic tissue and debridement process Treatment Activities: Apply topical anesthetic as ordered : 02/13/2022 Notes: Wound/Skin Impairment Nursing Diagnoses: Impaired tissue integrity Knowledge deficit related to ulceration/compromised skin integrity Goals: Patient/caregiver will verbalize understanding of skin care regimen Date Initiated: 02/13/2022 Target Resolution Date: 04/07/2022 Goal Status: Active Interventions: Assess ulceration(s) every visit Treatment Activities: Skin care regimen initiated : 02/13/2022 Topical wound management initiated : 02/13/2022 Notes: Electronic Signature(s) Signed: 03/31/2022 7:34:37 AM By: Dellie Catholic RN Entered By: Dellie Catholic on 03/30/2022 15:36:19 -------------------------------------------------------------------------------- Pain Assessment Details Patient Name: Date of Service: [name], [name]. 03/30/2022 1:45 PM Medical Record Number: [id number] Patient Account Number: [id number] Date of Birth/Sex: Treating RN: October 17, 1945 (76 y.o. Waldron Session Primary Care Heavan Francom: [name] Other Clinician: Referring Laya Letendre: Treating Isayah Ignasiak/Extender: [name] in Treatment: 6 Active Problems Location of Pain Severity and Description of Pain Patient Has Paino Yes Site Locations Rate the pain. Alesi, Balian [name] ([id number]) [id number] Page 6 of 11 Rate the pain. Current Pain Level: 2 Character of Pain Describe the Pain: Stabbing Pain Management and Medication Current Pain Management: Electronic Signature(s) Signed: 03/31/2022 12:15:24 PM By: Blanche East RN Entered By: Blanche East on 03/30/2022 14:56:28 -------------------------------------------------------------------------------- Patient/Caregiver Education Details Patient Name: Date of Service: [name], [name] 12/28/2023andnbsp1:45 PM Medical Record Number: [id number] Patient Account Number: [id number] Date of Birth/Gender: Treating RN: December 31, 1945 (76 y.o. Collene Gobble Primary Care Physician: [name] Other Clinician: Referring Physician: Treating Physician/Extender: [name] in Treatment: 6 Education Assessment Education Provided To: Patient Education Topics Provided Wound/Skin Impairment: Methods: Explain/Verbal Responses: Return demonstration correctly Electronic Signature(s) Signed: 03/31/2022 7:34:37 AM By: Dellie Catholic RN Entered By: Dellie Catholic on 03/31/2022 07:32:03 -------------------------------------------------------------------------------- Wound Assessment Details Patient Name: Date of Service: [name], [place]. 03/30/2022 1:45 PM Medical Record Number: [id number] Patient Account Number: [id number] [name], [name] ([id number]) [phone].pdf Page 7 of 11 Date of Birth/Sex: Treating RN: [birth year] (76 y.o. Waldron Session Primary Care Vivianne Carles: Other Clinician: [name] Referring Troy Kanouse: Treating Maysin Carstens/Extender: [name] in Treatment: 6 Wound Status Wound Number: 1 Primary Diabetic  Wound/Ulcer of the Lower Extremity Etiology: Wound Location: Right, Dorsal Foot Wound Open Wounding Event: Blister Status: Date Acquired: 01/31/2022 Comorbid Anemia, Lymphedema, Sleep Apnea, Arrhythmia, Congestive Heart Weeks Of Treatment: 6 History: Failure, Hypertension, Type II Diabetes, Neuropathy Clustered Wound: No Photos Wound Measurements Length: (cm) 0.1 Width: (cm) 0.1 Depth: (cm) 0.1 Area: (cm) 0.008 Volume: (cm) 0.001 % Reduction in Area: 99.9% % Reduction in Volume: 99.9% Epithelialization: Large (67-100%) Tunneling: No Undermining: No Wound Description Classification: Grade 1 Wound Margin: Distinct, outline attached Exudate Amount: None Present Foul Odor After Cleansing:  No Slough/Fibrino No Wound Bed Granulation Amount: Large (67-100%) Exposed Structure Granulation Quality: Red Fascia Exposed: No Necrotic Amount: None Present (0%) Fat Layer (Subcutaneous Tissue) Exposed: No Tendon Exposed: No Muscle Exposed: No Joint Exposed: No Bone Exposed: No Periwound Skin Texture Texture Color No Abnormalities Noted: Yes No Abnormalities Noted: No Erythema: No Moisture Hemosiderin Staining: Yes No Abnormalities Noted: No Dry / Scaly: Yes Temperature / Pain Maceration: No Temperature: No Abnormality Treatment Notes Wound #1 (Foot) Wound Laterality: Dorsal, Right Cleanser Soap and Water Discharge Instruction: May shower and wash wound with dial antibacterial soap and water prior to dressing change. Wound Cleanser Discharge Instruction: Cleanse the wound with wound cleanser prior to applying [name] clean dressing using gauze sponges, not tissue or cotton balls. Peri-Wound Care Sween Lotion (Moisturizing lotion) Discharge Instruction: Apply moisturizing lotion as directed Topical Daleo, Arrington [name] ([id number]) [id number] Page 8 of 11 Primary Dressing Sorbalgon AG Dressing 2x2 (in/in) Discharge Instruction: Apply to wound bed as  instructed Secondary Dressing Woven Gauze Sponge, Non-Sterile 4x4 in Discharge Instruction: Apply over primary dressing as directed. Zetuvit Plus 4x8 in Discharge Instruction: Apply over primary dressing as directed. Secured With Transpore Surgical Tape, 2x10 (in/yd) Discharge Instruction: Secure dressing with tape as directed. Compression Wrap Compression Stockings Add-Ons Electronic Signature(s) Signed: 03/31/2022 7:34:37 AM By: Dellie Catholic RN Signed: 03/31/2022 12:15:24 PM By: Blanche East RN Entered By: Dellie Catholic on 03/30/2022 15:14:46 -------------------------------------------------------------------------------- Wound Assessment Details Patient Name: Date of Service: [name], Xan [name]. 03/30/2022 1:45 PM Medical Record Number: [id number] Patient Account Number: [id number] Date of Birth/Sex: Treating RN: 03/17/1946 (76 y.o. Waldron Session Primary Care Clary Meeker: [name] Other Clinician: Referring Virl Coble: Treating Nahzir Pohle/Extender: [name] in Treatment: 6 Wound Status Wound Number: 4 Primary Lymphedema Etiology: Wound Location: Left, Anterior Lower Leg Wound Open Wounding Event: Blister Status: Date Acquired: 02/13/2021 Comorbid Anemia, Lymphedema, Sleep Apnea, Arrhythmia, Congestive Heart Weeks Of Treatment: 6 History: Failure, Hypertension, Type II Diabetes, Neuropathy Clustered Wound: No Photos Wound Measurements Length: (cm) 6 Width: (cm) 3.5 Depth: (cm) 0.1 Area: (cm) 16.493 Volume: (cm) 1.649 % Reduction in Area: -100% % Reduction in Volume: -99.9% Epithelialization: Small (1-33%) Tunneling: No Undermining: No Wound Description Classification: Full Thickness Without Exposed Support Structures Wound Margin: Distinct, outline attached Prins, Jaleil [name] ([id number]) Exudate Amount: Medium Exudate Type: Serous Exudate Color: amber Foul Odor After Cleansing: No Slough/Fibrino  Yes [id number] Page 9 of 11 Wound Bed Granulation Amount: Medium (34-66%) Exposed Structure Granulation Quality: Red, Pink Fascia Exposed: No Necrotic Amount: Medium (34-66%) Fat Layer (Subcutaneous Tissue) Exposed: Yes Necrotic Quality: Eschar, Adherent Slough Tendon Exposed: No Muscle Exposed: No Joint Exposed: No Bone Exposed: No Periwound Skin Texture Texture Color No Abnormalities Noted: Yes No Abnormalities Noted: No Erythema: Yes Moisture Hemosiderin Staining: Yes No Abnormalities Noted: No Dry / Scaly: No Temperature / Pain Temperature: No Abnormality Treatment Notes Wound #4 (Lower Leg) Wound Laterality: Left, Anterior Cleanser Soap and Water Discharge Instruction: May shower and wash wound with dial antibacterial soap and water prior to dressing change. Wound Cleanser Discharge Instruction: Cleanse the wound with wound cleanser prior to applying [name] clean dressing using gauze sponges, not tissue or cotton balls. Peri-Wound Care Sween Lotion (Moisturizing lotion) Discharge Instruction: Apply moisturizing lotion as directed Topical Mupirocin Ointment Discharge Instruction: Apply Mupirocin (Bactroban) as instructed Primary Dressing Sorbalgon AG Dressing, 4x4 (in/in) Discharge Instruction: Apply to wound bed as instructed Secondary Dressing Woven Gauze Sponge, Non-Sterile 4x4 in Discharge Instruction: Apply over primary dressing as directed.  Zetuvit Plus 4x8 in Discharge Instruction: Apply over primary dressing as directed. Secured With Transpore Surgical Tape, 2x10 (in/yd) Discharge Instruction: Secure dressing with tape as directed. Compression Wrap ThreePress (3 layer compression wrap) Discharge Instruction: Apply three layer compression as directed. Compression Stockings Jobst Farrow Wrap 4000 Quantity: 1 Left Leg Compression Amount: 30-40 mmHg Discharge Instruction: Apply Francia Greaves daily as instructed. Apply first thing in  the morning, remove at night before bed. Add-Ons Electronic Signature(s) Signed: 03/31/2022 7:34:37 AM By: Dellie Catholic RN Signed: 03/31/2022 12:15:24 PM By: Blanche East RN Entered By: Dellie Catholic on 03/30/2022 15:15:31 Breda, Mayra [name] ([id number]) [id number] Page 10 of 11 -------------------------------------------------------------------------------- Wound Assessment Details Patient Name: Date of Service: [name], Azlan [name]. 03/30/2022 1:45 PM Medical Record Number: [id number] Patient Account Number: [id number] Date of Birth/Sex: Treating RN: Aug 24, 1945 (76 y.o. Waldron Session Primary Care Agripina Guyette: [name] Other Clinician: Referring Silveria Botz: Treating Saydie Gerdts/Extender: [name] in Treatment: 6 Wound Status Wound Number: 5 Primary Lymphedema Etiology: Wound Location: Right, Anterior Lower Leg Wound Open Wounding Event: Gradually Appeared Status: Date Acquired: 03/30/2022 Comorbid Anemia, Lymphedema, Sleep Apnea, Arrhythmia, Congestive Heart Weeks Of Treatment: 0 History: Failure, Hypertension, Type II Diabetes, Neuropathy Clustered Wound: No Photos Wound Measurements Length: (cm) 1 Width: (cm) 0.5 Depth: (cm) 0.1 Area: (cm) 0.393 Volume: (cm) 0.039 % Reduction in Area: % Reduction in Volume: Epithelialization: None Tunneling: No Undermining: No Wound Description Classification: Full Thickness Without Exposed Suppor Wound Margin: Distinct, outline attached Exudate Amount: Medium Exudate Type: Serosanguineous Exudate Color: red, brown t Structures Foul Odor After Cleansing: No Slough/Fibrino No Wound Bed Granulation Amount: Large (67-100%) Exposed Structure Granulation Quality: Red Fascia Exposed: No Necrotic Amount: Small (1-33%) Fat Layer (Subcutaneous Tissue) Exposed: No Necrotic Quality: Adherent Slough Tendon Exposed: No Muscle Exposed: No Joint Exposed: No Bone Exposed:  No Periwound Skin Texture Texture Color No Abnormalities Noted: Yes No Abnormalities Noted: No Hemosiderin Staining: Yes Moisture No Abnormalities Noted: Yes Temperature / Pain Temperature: No Abnormality Assessment Notes Clustered Treatment Notes Wound #5 (Lower Leg) Wound Laterality: Right, Anterior Rinck, Covey [name] ([id number]) [id number] Page 11 of 11 Cleanser Peri-Wound Care Topical Primary Dressing Secondary Dressing Secured With Compression Wrap Compression Stockings Add-Ons Electronic Signature(s) Signed: 03/31/2022 7:34:37 AM By: Dellie Catholic RN Signed: 03/31/2022 12:15:24 PM By: Blanche East RN Entered By: Dellie Catholic on 03/30/2022 15:16:04 -------------------------------------------------------------------------------- Vitals Details Patient Name: Date of Service: [name], Xxavier [name]. 03/30/2022 1:45 PM Medical Record Number: [id number] Patient Account Number: [id number] Date of Birth/Sex: Treating RN: 05/21/1945 (76 y.o. Waldron Session Primary Care Fareeda Downard: [name] Other Clinician: Referring Thayer Embleton: Treating Shenise Wolgamott/Extender: [name] in Treatment: 6 Vital Signs Time Taken: 14:45 Temperature (F): 98.5 Height (in): 72 Pulse (bpm): 59 Weight (lbs): 375 Respiratory Rate (breaths/min): 22 Body Mass Index (BMI): 50.9 Blood Pressure (mmHg): 123/83 Capillary Blood Glucose (mg/dl): 97 Reference Range: 80 - 120 mg / dl Airway Pulse Oximetry (%): 95 Inhaled Oxygen Concentration (%): 3 Electronic Signature(s) Signed: 03/31/2022 7:34:37 AM By: Dellie Catholic RN Entered By: Dellie Catholic on 03/31/2022 07:31:46

## 2022-04-10 ENCOUNTER — Encounter (HOSPITAL_BASED_OUTPATIENT_CLINIC_OR_DEPARTMENT_OTHER): Payer: Medicare Other | Attending: General Surgery | Admitting: General Surgery

## 2022-04-10 DIAGNOSIS — Z7901 Long term (current) use of anticoagulants: Secondary | ICD-10-CM | POA: Insufficient documentation

## 2022-04-10 DIAGNOSIS — J9611 Chronic respiratory failure with hypoxia: Secondary | ICD-10-CM | POA: Diagnosis not present

## 2022-04-10 DIAGNOSIS — J3801 Paralysis of vocal cords and larynx, unilateral: Secondary | ICD-10-CM | POA: Diagnosis not present

## 2022-04-10 DIAGNOSIS — L03115 Cellulitis of right lower limb: Secondary | ICD-10-CM | POA: Diagnosis not present

## 2022-04-10 DIAGNOSIS — I5032 Chronic diastolic (congestive) heart failure: Secondary | ICD-10-CM | POA: Insufficient documentation

## 2022-04-10 DIAGNOSIS — E11621 Type 2 diabetes mellitus with foot ulcer: Secondary | ICD-10-CM | POA: Insufficient documentation

## 2022-04-10 DIAGNOSIS — L97822 Non-pressure chronic ulcer of other part of left lower leg with fat layer exposed: Secondary | ICD-10-CM | POA: Insufficient documentation

## 2022-04-10 DIAGNOSIS — I890 Lymphedema, not elsewhere classified: Secondary | ICD-10-CM | POA: Diagnosis not present

## 2022-04-10 DIAGNOSIS — G4730 Sleep apnea, unspecified: Secondary | ICD-10-CM | POA: Insufficient documentation

## 2022-04-10 DIAGNOSIS — L97812 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d: Secondary | ICD-10-CM | POA: Insufficient documentation

## 2022-04-10 DIAGNOSIS — E1140 Type 2 diabetes mellitus with diabetic neuropathy, unspecified: Secondary | ICD-10-CM | POA: Insufficient documentation

## 2022-04-10 DIAGNOSIS — Z6841 Body Mass Index (BMI) 40.0 and over, adult: Secondary | ICD-10-CM | POA: Insufficient documentation

## 2022-04-10 DIAGNOSIS — I110 Hypertensive heart disease with heart failure: Secondary | ICD-10-CM | POA: Insufficient documentation

## 2022-04-11 NOTE — Progress Notes (Signed)
Danziger, Samaad [name] ([id number]) [id number] Page 1 of 13 Visit Report for 04/10/2022 Chief Complaint Document Details Patient Name: Date of Service: [name], [name]. 04/10/2022 1:45 PM Medical Record Number: [id number] Patient Account Number: [id number] Date of Birth/Sex: Treating RN: 1945/10/17 (77 y.o. M) Primary Care Provider: [name] Other Clinician: Referring Provider: Treating Provider/Extender: [name] in Treatment: 8 Information Obtained from: Patient Chief Complaint Patients presents for treatment of open ulcers secondary to lymphedema and diabetes Electronic Signature(s) Signed: 04/10/2022 3:10:10 PM By: [name] MD FACS Entered By: [name] on 04/10/2022 15:10:09 -------------------------------------------------------------------------------- Debridement Details Patient Name: Date of Service: [name], [name]. 04/10/2022 1:45 PM Medical Record Number: [id number] Patient Account Number: [id number] Date of Birth/Sex: Treating RN: 08/16/1945 (77 y.o. [name] Primary Care Provider: [name] Other Clinician: Referring Provider: Treating Provider/Extender: [name] in Treatment: 8 Debridement Performed for Assessment: Wound #4 Left,Anterior Lower Leg Performed By: Physician [name], MD Debridement Type: Debridement Level of Consciousness (Pre-procedure): Awake and Alert Pre-procedure Verification/Time Out Yes - 14:34 Taken: Start Time: 14:38 Pain Control: Lidocaine 5% topical ointment T Area Debrided (L x W): otal 0.8 (cm) x 0.5 (cm) = 0.4 (cm) Tissue and other material debrided: Non-Viable, Eschar, Slough, Slough Level: Non-Viable Tissue Debridement Description: Selective/Open Wound Instrument: Curette Bleeding: Minimum Hemostasis Achieved: Pressure Procedural Pain: 0 Post Procedural Pain: 0 Response to Treatment: Procedure was  tolerated well Level of Consciousness (Post- Awake and Alert procedure): Post Debridement Measurements of Total Wound Length: (cm) 0.8 Width: (cm) 0.5 Depth: (cm) 0.1 Volume: (cm) 0.031 Character of Wound/Ulcer Post Debridement: Improved Post Procedure Diagnosis Same as Pre-procedure Burch, Alwyn [name] ([id number]) [id number] Page 2 of 13 Notes Scribed for Dr Celine Ahr by Sharyn Creamer, RN Electronic Signature(s) Signed: 04/10/2022 5:11:08 PM By: [name] MD FACS Signed: 04/10/2022 5:16:32 PM By: Sharyn Creamer RN, BSN Entered By: Sharyn Creamer on 04/10/2022 14:39:53 -------------------------------------------------------------------------------- Debridement Details Patient Name: Date of Service: [name], [name]. 04/10/2022 1:45 PM Medical Record Number: [id number] Patient Account Number: [id number] Date of Birth/Sex: Treating RN: 01-25-1946 (76 y.o. [name] Primary Care Provider: [name] Other Clinician: Referring Provider: Treating Provider/Extender: [name] in Treatment: 8 Debridement Performed for Assessment: Wound #5 Right,Anterior Lower Leg Performed By: Physician [name], MD Debridement Type: Debridement Level of Consciousness (Pre-procedure): Awake and Alert Pre-procedure Verification/Time Out Yes - 14:34 Taken: Start Time: 14:38 Pain Control: Lidocaine 5% topical ointment T Area Debrided (L x W): otal 1 (cm) x 0.4 (cm) = 0.4 (cm) Tissue and other material debrided: Non-Viable, Eschar, Slough, Slough Level: Non-Viable Tissue Debridement Description: Selective/Open Wound Instrument: Curette Bleeding: Minimum Hemostasis Achieved: Pressure Procedural Pain: 0 Post Procedural Pain: 0 Response to Treatment: Procedure was tolerated well Level of Consciousness (Post- Awake and Alert procedure): Post Debridement Measurements of Total Wound Length: (cm) 1 Width: (cm) 0.4 Depth:  (cm) 0.1 Volume: (cm) 0.031 Character of Wound/Ulcer Post Debridement: Improved Post Procedure Diagnosis Same as Pre-procedure Notes Scribed for Dr Celine Ahr by Sharyn Creamer, RN Electronic Signature(s) Signed: 04/10/2022 5:11:08 PM By: [name] MD FACS Signed: 04/10/2022 5:16:32 PM By: Sharyn Creamer RN, BSN Entered By: Sharyn Creamer on 04/10/2022 14:40:54 Debridement Details -------------------------------------------------------------------------------- [name] ([id number]) [id number] Page 3 of 13 Patient Name: Date of Service: [name], [name]. 04/10/2022 1:45 PM Medical Record Number: [id number] Patient Account Number: [id number] Date of Birth/Sex: Treating RN: [birth year] (77 y.o. [name] Primary  Care Provider: [name] Other Clinician: Referring Provider: Treating Provider/Extender: [name] in Treatment: 8 Debridement Performed for Assessment: Wound #6 Right,Proximal,Anterior Lower Leg Performed By: Physician [name], MD Debridement Type: Debridement Level of Consciousness (Pre-procedure): Awake and Alert Pre-procedure Verification/Time Out Yes - 14:34 Taken: Start Time: 14:38 Pain Control: Lidocaine 5% topical ointment T Area Debrided (L x W): otal 3 (cm) x 5 (cm) = 15 (cm) Tissue and other material debrided: Non-Viable, Slough, Subcutaneous, Slough Level: Skin/Subcutaneous Tissue Debridement Description: Excisional Instrument: Curette Bleeding: Minimum Hemostasis Achieved: Pressure Procedural Pain: 0 Post Procedural Pain: 0 Response to Treatment: Procedure was tolerated well Level of Consciousness (Post- Awake and Alert procedure): Post Debridement Measurements of Total Wound Length: (cm) 3 Width: (cm) 5 Depth: (cm) 0.1 Volume: (cm) 1.178 Character of Wound/Ulcer Post Debridement: Improved Post Procedure Diagnosis Same as Pre-procedure Notes Scribed for Dr  Celine Ahr by Sharyn Creamer, RN Electronic Signature(s) Signed: 04/10/2022 5:11:08 PM By: [name] MD FACS Signed: 04/10/2022 5:16:32 PM By: Sharyn Creamer RN, BSN Entered By: Sharyn Creamer on 04/10/2022 14:42:15 -------------------------------------------------------------------------------- HPI Details Patient Name: Date of Service: [name], [name]. 04/10/2022 1:45 PM Medical Record Number: [id number] Patient Account Number: [id number] Date of Birth/Sex: Treating RN: 1945/06/09 (77 y.o. M) Primary Care Provider: [name] Other Clinician: Referring Provider: Treating Provider/Extender: [name] in Treatment: 8 History of Present Illness HPI Description: ADMISSION 02/13/2022 This is [name] 77 year old morbidly obese type II diabetic (last hemoglobin A1c 6.6%) who presented to his primary care provider's office on November 7 with an ulcer on the top of his foot. He said it had been there for about [name] week. He is insensate in his feet and his wife had been caring for it. His PCP took [name] swab from the wound which grew back strep. [name] was prescribed and he was referred to the wound care center for further evaluation and management. In addition to diabetes, he also has pulmonary hypertension and diastolic congestive heart failure. 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 He has been seen at the voice and swallowing disorders clinic in [place] and no further intervention has been felt necessary. On exam, he has severe, at least stage II, lymphedema. In addition to the dorsal foot wound, he has ulcers on both lower legs. He has [name] left anterior lower leg, [name] right lateral lower leg, and [name] right anterior lower leg wound. ABIs in clinic were noncompressible. 02/22/2022: The right lateral leg wound has closed. The other wounds are about the same size to slightly smaller. All of them have [name] thick layer of  rubbery Dondero, Zeke [name] ([id number]) [id number] Page 4 of 13 slough on the surface. Edema control is still suboptimal. 03/02/2022: The left anterior leg wound has deteriorated. His leg is more swollen and red. There is thick slough accumulation on the surface. The right dorsal foot wound is smaller, but also has substantial slough buildup. 03/09/2022: The PCR culture that I took last week was positive for Pseudomonas. Levofloxacin was prescribed and he has [name] few days left of this. His leg looks better and the wound is cleaner. The right dorsal foot wound has [name] very thick layer of slough on the surface. Both wounds are smaller. 03/30/2022: All of the wounds are smaller today and very superficial. The dorsal foot wound on the right is nearly closed. There is just some slough and eschar present. 04/10/2022: The right  dorsal foot wound has healed. Unfortunately, his right-sided wrap slid and above this, he developed significant swelling and now has blisters and several new open wounds with slough accumulation. The left anterior leg wound is small with slough and eschar buildup. Electronic Signature(s) Signed: 04/10/2022 3:11:30 PM By: [name] MD FACS Entered By: [name] on 04/10/2022 15:11:30 -------------------------------------------------------------------------------- Physical Exam Details Patient Name: Date of Service: [name], [name]. 04/10/2022 1:45 PM Medical Record Number: [id number] Patient Account Number: [id number] Date of Birth/Sex: Treating RN: 01/26/1946 (77 y.o. M) Primary Care Provider: [name] Other Clinician: Referring Provider: Treating Provider/Extender: [name] in Treatment: 8 Constitutional Slightly hypertensive. . . . no acute distress. Respiratory Normal work of breathing on supplemental oxygen. Notes 04/10/2022: The right dorsal foot wound has healed. Unfortunately, his right-sided wrap  slid and above this, he developed significant swelling and now has blisters and several new open wounds with slough accumulation. The left anterior leg wound is small with slough and eschar buildup. Electronic Signature(s) Signed: 04/10/2022 3:12:21 PM By: [name] MD FACS Entered By: [name] on 04/10/2022 15:12:21 -------------------------------------------------------------------------------- Physician Orders Details Patient Name: Date of Service: [name], Tayo [name]. 04/10/2022 1:45 PM Medical Record Number: [id number] Patient Account Number: [id number] Date of Birth/Sex: Treating RN: 18-Mar-1946 (77 y.o. [name] Primary Care Provider: [name] Other Clinician: Referring Provider: Treating Provider/Extender: [name] in Treatment: 8 Verbal / Phone Orders: No Diagnosis Coding ICD-10 Coding Code Description [phone] 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d L97.822 Non-pressure chronic ulcer of other part of left lower leg with fat layer exposed I50.30 Unspecified diastolic (congestive) heart failure J96.11 Chronic respiratory failure with hypoxia [name], [name] ([id number]) [id number] Page 5 of 13 L03.115 Cellulitis of right lower limb E11.621 Type 2 diabetes mellitus with foot ulcer E66.01 Morbid (severe) obesity due to excess calories I89.0 Lymphedema, not elsewhere classified Follow-up Appointments ppointment in 1 week. - Dr. Celine Ahr - Room 2 Return [name] Nurse Visit: - If no Wound care visit possible for next week-[name] nurse visit if there is an appointment next week. Anesthetic (In clinic) Topical Lidocaine 4% applied to wound bed Edema Control - Lymphedema / SCD / Other Avoid standing for long periods of time. Exercise regularly Wound Treatment Wound #4 - Lower Leg Wound Laterality: Left, Anterior Cleanser: Soap and Water 1 x Per Week/30  Days Discharge Instructions: May shower and wash wound with dial antibacterial soap and water prior to dressing change. Cleanser: Wound Cleanser 1 x Per Week/30 Days Discharge Instructions: Cleanse the wound with wound cleanser prior to applying [name] clean dressing using gauze sponges, not tissue or cotton balls. Peri-Wound Care: Sween Lotion (Moisturizing lotion) 1 x Per Week/30 Days Discharge Instructions: Apply moisturizing lotion as directed Topical: Mupirocin Ointment 1 x Per Week/30 Days Discharge Instructions: Apply Mupirocin (Bactroban) as instructed Prim Dressing: Sorbalgon AG Dressing, 4x4 (in/in) 1 x Per Week/30 Days ary Discharge Instructions: Apply to wound bed as instructed Secondary Dressing: Woven Gauze Sponge, Non-Sterile 4x4 in 1 x Per Week/30 Days Discharge Instructions: Apply over primary dressing as directed. Secondary Dressing: Zetuvit Plus 4x8 in 1 x Per Week/30 Days Discharge Instructions: Apply over primary dressing as directed. Secured With: Transpore Surgical Tape, 2x10 (in/yd) 1 x Per Week/30 Days Discharge Instructions: Secure dressing with tape as directed. Compression Wrap: Unnaboot w/Calamine, 4x10 (in/yd) 1 x Per Week/30 Days Discharge Instructions: Apply Unnaboot as directed. Compression Wrap:  Kerlix Roll 4.5x3.1 (in/yd) 1 x Per Week/30 Days Discharge Instructions: Apply Kerlix and Coban compression as directed. Compression Wrap: Coban Self-Adherent Wrap 4x5 (in/yd) 1 x Per Week/30 Days Discharge Instructions: Apply over Kerlix as directed. Compression Stockings: Jobst Farrow Wrap 4000 Left Leg Compression Amount: 30-40 mmHG Discharge Instructions: Apply Francia Greaves daily as instructed. Apply first thing in the morning, remove at night before bed. Wound #5 - Lower Leg Wound Laterality: Right, Anterior Cleanser: Soap and Water 1 x Per Week/30 Days Discharge Instructions: May shower and wash wound with dial antibacterial soap and water prior to dressing  change. Cleanser: Wound Cleanser 1 x Per Week/30 Days Discharge Instructions: Cleanse the wound with wound cleanser prior to applying [name] clean dressing using gauze sponges, not tissue or cotton balls. Peri-Wound Care: Sween Lotion (Moisturizing lotion) 1 x Per Week/30 Days Discharge Instructions: Apply moisturizing lotion as directed Topical: Mupirocin Ointment 1 x Per Week/30 Days Discharge Instructions: Apply Mupirocin (Bactroban) as instructed Prim Dressing: Sorbalgon AG Dressing, 4x4 (in/in) 1 x Per Week/30 Days ary Discharge Instructions: Apply to wound bed as instructed Secondary Dressing: Woven Gauze Sponge, Non-Sterile 4x4 in 1 x Per Week/30 Days Discharge Instructions: Apply over primary dressing as directed. Secondary Dressing: Zetuvit Plus 4x8 in 1 x Per Week/30 Days Discharge Instructions: Apply over primary dressing as directed. Sansone, Neyland [name] ([id number]) [id number] Page 6 of 13 Secured With: Transpore Surgical Tape, 2x10 (in/yd) 1 x Per Week/30 Days Discharge Instructions: Secure dressing with tape as directed. Compression Wrap: Unnaboot w/Calamine, 4x10 (in/yd) 1 x Per Week/30 Days Discharge Instructions: Apply Unnaboot as directed. Compression Wrap: Kerlix Roll 4.5x3.1 (in/yd) 1 x Per Week/30 Days Discharge Instructions: Apply Kerlix and Coban compression as directed. Compression Wrap: Coban Self-Adherent Wrap 4x5 (in/yd) 1 x Per Week/30 Days Discharge Instructions: Apply over Kerlix as directed. Wound #6 - Lower Leg Wound Laterality: Right, Anterior, Proximal Cleanser: Soap and Water 1 x Per Week/30 Days Discharge Instructions: May shower and wash wound with dial antibacterial soap and water prior to dressing change. Cleanser: Wound Cleanser 1 x Per Week/30 Days Discharge Instructions: Cleanse the wound with wound cleanser prior to applying [name] clean dressing using gauze sponges, not tissue or cotton balls. Peri-Wound Care: Sween Lotion  (Moisturizing lotion) 1 x Per Week/30 Days Discharge Instructions: Apply moisturizing lotion as directed Topical: Mupirocin Ointment 1 x Per Week/30 Days Discharge Instructions: Apply Mupirocin (Bactroban) as instructed Prim Dressing: Sorbalgon AG Dressing, 4x4 (in/in) 1 x Per Week/30 Days ary Discharge Instructions: Apply to wound bed as instructed Secondary Dressing: Woven Gauze Sponge, Non-Sterile 4x4 in 1 x Per Week/30 Days Discharge Instructions: Apply over primary dressing as directed. Secondary Dressing: Zetuvit Plus 4x8 in 1 x Per Week/30 Days Discharge Instructions: Apply over primary dressing as directed. Secured With: Transpore Surgical Tape, 2x10 (in/yd) 1 x Per Week/30 Days Discharge Instructions: Secure dressing with tape as directed. Compression Wrap: Unnaboot w/Calamine, 4x10 (in/yd) 1 x Per Week/30 Days Discharge Instructions: Apply Unnaboot as directed. Compression Wrap: Kerlix Roll 4.5x3.1 (in/yd) 1 x Per Week/30 Days Discharge Instructions: Apply Kerlix and Coban compression as directed. Compression Wrap: Coban Self-Adherent Wrap 4x5 (in/yd) 1 x Per Week/30 Days Discharge Instructions: Apply over Kerlix as directed. Patient Medications llergies: morphine, penicillin, codeine, propoxyphene, Darvocet-[name] [name] Notifications Medication Indication Start End prior to debridement 04/10/2022 lidocaine DOSE topical 5 % ointment - ointment topical once daily Electronic Signature(s) Signed: 04/10/2022 5:11:08 PM By: [name] MD FACS Entered By: [name] on 04/10/2022 15:12:49 -------------------------------------------------------------------------------- Problem List  Details Patient Name: Date of Service: [name], SARGEANT [name]. 04/10/2022 1:45 PM Medical Record Number: [id number] Patient Account Number: [id number] Date of Birth/Sex: Treating RN: 1945-04-28 (77 y.o. M) Primary Care Provider: [name] Other Clinician: [name] ([id number])  [id number] Page 7 of 13 Referring Provider: Treating Provider/Extender: [name] in Treatment: 8 Active Problems ICD-10 Encounter Code Description Active Date MDM Diagnosis L97.812 Non-pressure chronic ulcer of other part of right lower leg with fat layer 02/13/2022 No Yes exposed L97.822 Non-pressure chronic ulcer of other part of left lower leg with fat layer exposed11/13/2023 No Yes I50.30 Unspecified diastolic (congestive) heart failure 02/13/2022 No Yes J96.11 Chronic respiratory failure with hypoxia 02/13/2022 No Yes L03.115 Cellulitis of right lower limb 02/13/2022 No Yes E11.621 Type 2 diabetes mellitus with foot ulcer 02/13/2022 No Yes E66.01 Morbid (severe) obesity due to excess calories 02/13/2022 No Yes I89.0 Lymphedema, not elsewhere classified 02/13/2022 No Yes Inactive Problems ICD-10 Code Description Active Date Inactive Date L97.512 Non-pressure chronic ulcer of other part of right foot with fat layer exposed 02/13/2022 02/13/2022 Resolved Problems Electronic Signature(s) Signed: 04/10/2022 3:09:52 PM By: [name] MD FACS Entered By: [name] on 04/10/2022 15:09:52 -------------------------------------------------------------------------------- Progress Note Details Patient Name: Date of Service: [name], Breon [name]. 04/10/2022 1:45 PM Medical Record Number: [id number] Patient Account Number: [id number] Date of Birth/Sex: Treating RN: 14-Sep-1945 (77 y.o. M) Primary Care Provider: [name] Other Clinician: Referring Provider: Treating Provider/Extender: [name] in Treatment: 8 Subjective Waynick, Virgilio [name] ([id number]) [id number] Page 8 of 13 Chief Complaint Information obtained from Patient Patients presents for treatment of open ulcers secondary to lymphedema and diabetes History of Present Illness  (HPI) ADMISSION 02/13/2022 This is [name] 77 year old morbidly obese type II diabetic (last hemoglobin A1c 6.6%) who presented to his primary care provider's office on November 7 with an ulcer on the top of his foot. He said it had been there for about [name] week. He is insensate in his feet and his wife had been caring for it. His PCP took [name] swab from the wound which grew back strep. [name] was prescribed and he was referred to the wound care center for further evaluation and management. In addition to diabetes, he also has pulmonary hypertension and diastolic congestive heart failure. 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 He has been seen at the voice and swallowing disorders clinic in [place] and no further intervention has been felt necessary. On exam, he has severe, at least stage II, lymphedema. In addition to the dorsal foot wound, he has ulcers on both lower legs. He has [name] left anterior lower leg, [name] right lateral lower leg, and [name] right anterior lower leg wound. ABIs in clinic were noncompressible. 02/22/2022: The right lateral leg wound has closed. The other wounds are about the same size to slightly smaller. All of them have [name] thick layer of rubbery slough on the surface. Edema control is still suboptimal. 03/02/2022: The left anterior leg wound has deteriorated. His leg is more swollen and red. There is thick slough accumulation on the surface. The right dorsal foot wound is smaller, but also has substantial slough buildup. 03/09/2022: The PCR culture that I took last week was positive for Pseudomonas. Levofloxacin was prescribed and he has [name] few days left of this. His leg looks better and the wound is cleaner. The right dorsal foot wound has [name] very thick layer  of slough on the surface. Both wounds are smaller. 03/30/2022: All of the wounds are smaller today and very superficial. The dorsal foot wound on the right is nearly closed. There is  just some slough and eschar present. 04/10/2022: The right dorsal foot wound has healed. Unfortunately, his right-sided wrap slid and above this, he developed significant swelling and now has blisters and several new open wounds with slough accumulation. The left anterior leg wound is small with slough and eschar buildup. Patient History Information obtained from Patient. Family History Cancer - Paternal Grandparents, Heart Disease - Maternal Grandparents,Father, No family history of Diabetes, Hereditary Spherocytosis, Hypertension, Kidney Disease, Lung Disease, Seizures, Stroke, Thyroid Problems, Tuberculosis. Social History Former smoker - smoked as [name] teen, Marital Status - Married, Alcohol Use - Never, Drug Use - No History, Caffeine Use - Never. Medical History Hematologic/Lymphatic Patient has history of Anemia, Lymphedema Respiratory Patient has history of Sleep Apnea Cardiovascular Patient has history of Arrhythmia - PAF, Congestive Heart Failure - diastolic, Hypertension Endocrine Patient has history of Type II Diabetes - takes no meds, "borderline A1c" Neurologic Patient has history of Neuropathy Oncologic Denies history of Received Chemotherapy, Received Radiation Hospitalization/Surgery History - right and left heart cath. - colonoscopy. - esophagogastroduodenoscopy. - subxyphoid pericardial window. - intraoperative transesophageal echocardiogram. - laparoscopic gastric banding with hiatal hernia repair. - breath tek h pylori. - cholecysectomy. - arthroscopy knee w/ drilling. - lithotripsy. - tonsillectomy. - total knee replacement. - trigger finger repair. - wisdom tooth extraction. Medical [name] Surgical History Notes nd Constitutional Symptoms (General Health) obsesity Hematologic/Lymphatic chronic anticoagulation Cardiovascular pericarditis Gastrointestinal pt says 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 but they could not prove  it Genitourinary BPH Neurologic 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, embolic cerebral infarction, CVA Psychiatric depression Objective Constitutional [name], [name] ([id number]) [id number] Page 9 of 13 Slightly hypertensive. no acute distress. Vitals Time Taken: 2:00 PM, Height: 72 in, Weight: 375 lbs, BMI: 50.9, Temperature: 98.5 F, Pulse: 62 bpm, Respiratory Rate: 20 breaths/min, Blood Pressure: 143/78 mmHg. Respiratory Normal work of breathing on supplemental oxygen. General Notes: 04/10/2022: The right dorsal foot wound has healed. Unfortunately, his right-sided wrap slid and above this, he developed significant swelling and now has blisters and several new open wounds with slough accumulation. The left anterior leg wound is small with slough and eschar buildup. Integumentary (Hair, Skin) Wound #1 status is Open. Original cause of wound was Blister. The date acquired was: 01/31/2022. The wound has been in treatment 8 weeks. The wound is located on the Right,Dorsal Foot. The wound measures 0cm length x 0cm width x 0cm depth; 0cm^2 area and 0cm^3 volume. There is no tunneling or undermining noted. There is [name] none present amount of drainage noted. The wound margin is distinct with the outline attached to the wound base. There is no granulation within the wound bed. There is no necrotic tissue within the wound bed. The periwound skin appearance had no abnormalities noted for texture. The periwound skin appearance exhibited: Dry/Scaly, Hemosiderin Staining. The periwound skin appearance did not exhibit: Maceration, Erythema. Periwound temperature was noted as No Abnormality. Wound #4 status is Open. Original cause of wound was Blister. The date acquired was: 02/13/2021. The wound has been in treatment 8 weeks. The wound is located on the Left,Anterior Lower Leg. The wound measures 0.8cm length x 0.5cm width x 0.1cm depth; 0.314cm^2 area and 0.031cm^3 volume. There is  Fat Layer (Subcutaneous Tissue) exposed. There is no tunneling or undermining noted. There is [name]  medium amount of serous drainage noted. The wound margin is distinct with the outline attached to the wound base. There is medium (34-66%) red, pink granulation within the wound bed. There is [name] medium (34-66%) amount of necrotic tissue within the wound bed including Eschar and Adherent Slough. The periwound skin appearance had no abnormalities noted for texture. The periwound skin appearance exhibited: Hemosiderin Staining, Erythema. The periwound skin appearance did not exhibit: Dry/Scaly. The surrounding wound skin color is noted with erythema. Periwound temperature was noted as No Abnormality. Wound #5 status is Open. Original cause of wound was Gradually Appeared. The date acquired was: 03/30/2022. The wound has been in treatment 1 weeks. The wound is located on the Right,Anterior Lower Leg. The wound measures 1cm length x 0.4cm width x 0.1cm depth; 0.314cm^2 area and 0.031cm^3 volume. There is Fat Layer (Subcutaneous Tissue) exposed. There is no tunneling or undermining noted. There is [name] medium amount of serosanguineous drainage noted. The wound margin is distinct with the outline attached to the wound base. There is large (67-100%) red granulation within the wound bed. There is [name] small (1-33%) amount of necrotic tissue within the wound bed including Adherent Slough. The periwound skin appearance had no abnormalities noted for texture. The periwound skin appearance had no abnormalities noted for moisture. The periwound skin appearance exhibited: Hemosiderin Staining. Periwound temperature was noted as No Abnormality. Wound #6 status is Open. Original cause of wound was Blister. The date acquired was: 04/10/2022. The wound is located on the Right,Proximal,Anterior Lower Leg. The wound measures 3cm length x 5cm width x 0.1cm depth; 11.781cm^2 area and 1.178cm^3 volume. There is Fat Layer (Subcutaneous  Tissue) exposed. There is no tunneling or undermining noted. There is [name] medium amount of serosanguineous drainage noted. There is large (67-100%) red granulation within the wound bed. There is [name] small (1-33%) amount of necrotic tissue within the wound bed including Adherent Slough. The periwound skin appearance exhibited: Erythema. The surrounding wound skin color is noted with erythema. Assessment Active Problems ICD-10 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d Non-pressure chronic ulcer of other part of left lower leg with fat layer exposed Unspecified diastolic (congestive) heart failure Chronic respiratory failure with hypoxia Cellulitis of right lower limb Type 2 diabetes mellitus with foot ulcer Morbid (severe) obesity due to excess calories Lymphedema, not elsewhere classified Procedures Wound #4 Pre-procedure diagnosis of Wound #4 is [name] Lymphedema located on the Left,Anterior Lower Leg . There was [name] Selective/Open Wound Non-Viable Tissue Debridement with [name] total area of 0.4 sq cm performed by [name], MD. With the following instrument(s): Curette to remove Non-Viable tissue/material. Material removed includes Eschar and Slough and after achieving pain control using Lidocaine 5% topical ointment. No specimens were taken. [name] time out was conducted at 14:34, prior to the start of the procedure. [name] Minimum amount of bleeding was controlled with Pressure. The procedure was tolerated well with [name] pain level of 0 throughout and [name] pain level of 0 following the procedure. Post Debridement Measurements: 0.8cm length x 0.5cm width x 0.1cm depth; 0.031cm^3 volume. Character of Wound/Ulcer Post Debridement is improved. Post procedure Diagnosis Wound #4: Same as Pre-Procedure General Notes: Scribed for Dr Celine Ahr by Sharyn Creamer, RN. Pre-procedure diagnosis of Wound #4 is [name] Lymphedema located on the Left,Anterior Lower Leg . There was [name] Haematologist  Compression Therapy Procedure by Sharyn Creamer, RN. Post procedure Diagnosis Wound #4: Same as Pre-Procedure Wound #5 Pre-procedure diagnosis of Wound #5  is [name] Lymphedema located on the Right,Anterior Lower Leg . There was [name] Selective/Open Wound Non-Viable Tissue Debridement with [name] total area of 0.4 sq cm performed by [name], MD. With the following instrument(s): Curette to remove Non-Viable tissue/material. Material removed includes Eschar and Slough and after achieving pain control using Lidocaine 5% topical ointment. No specimens were taken. [name] time out was conducted at 14:34, prior to the start of the procedure. [name] Minimum amount of bleeding was controlled with Pressure. The procedure was tolerated well with [name] pain level of 0 throughout and [name] pain level of 0 following the procedure. Post Debridement Measurements: 1cm length x 0.4cm width x 0.1cm depth; 0.031cm^3 volume. [name], [name] ([id number]) [id number] Page 10 of 13 Character of Wound/Ulcer Post Debridement is improved. Post procedure Diagnosis Wound #5: Same as Pre-Procedure General Notes: Scribed for Dr Celine Ahr by Sharyn Creamer, RN. Pre-procedure diagnosis of Wound #5 is [name] Lymphedema located on the Right,Anterior Lower Leg . There was [name] Haematologist Compression Therapy Procedure by Sharyn Creamer, RN. Post procedure Diagnosis Wound #5: Same as Pre-Procedure Wound #6 Pre-procedure diagnosis of Wound #6 is [name] Lymphedema located on the Right,Proximal,Anterior Lower Leg . There was [name] Excisional Skin/Subcutaneous Tissue Debridement with [name] total area of 15 sq cm performed by [name], MD. With the following instrument(s): Curette to remove Non-Viable tissue/material. Material removed includes Subcutaneous Tissue and Slough and after achieving pain control using Lidocaine 5% topical ointment. No specimens were taken. [name] time out was conducted at 14:34, prior to the start of the procedure. [name] Minimum  amount of bleeding was controlled with Pressure. The procedure was tolerated well with [name] pain level of 0 throughout and [name] pain level of 0 following the procedure. Post Debridement Measurements: 3cm length x 5cm width x 0.1cm depth; 1.178cm^3 volume. Character of Wound/Ulcer Post Debridement is improved. Post procedure Diagnosis Wound #6: Same as Pre-Procedure General Notes: Scribed for Dr Celine Ahr by Sharyn Creamer, RN. Pre-procedure diagnosis of Wound #6 is [name] Lymphedema located on the Right,Proximal,Anterior Lower Leg . There was [name] Haematologist Compression Therapy Procedure by Sharyn Creamer, RN. Post procedure Diagnosis Wound #6: Same as Pre-Procedure Wound #1 Pre-procedure diagnosis of Wound #1 is [name] Diabetic Wound/Ulcer of the Lower Extremity located on the Right,Dorsal Foot . There was [name] Haematologist Compression Therapy Procedure by Sharyn Creamer, RN. Post procedure Diagnosis Wound #1: Same as Pre-Procedure Plan Follow-up Appointments: Return Appointment in 1 week. - Dr. Celine Ahr - Room 2 Nurse Visit: - If no Wound care visit possible for next week-[name] nurse visit if there is an appointment next week. Anesthetic: (In clinic) Topical Lidocaine 4% applied to wound bed Edema Control - Lymphedema / SCD / Other: Avoid standing for long periods of time. Exercise regularly The following medication(s) was prescribed: lidocaine topical 5 % ointment ointment topical once daily for prior to debridement was prescribed at facility WOUND #4: - Lower Leg Wound Laterality: Left, Anterior Cleanser: Soap and Water 1 x Per Week/30 Days Discharge Instructions: May shower and wash wound with dial antibacterial soap and water prior to dressing change. Cleanser: Wound Cleanser 1 x Per Week/30 Days Discharge Instructions: Cleanse the wound with wound cleanser prior to applying [name] clean dressing using gauze sponges, not tissue or cotton balls. Peri-Wound Care: Sween Lotion (Moisturizing lotion) 1 x Per Week/30  Days Discharge Instructions: Apply moisturizing lotion as directed Topical: Mupirocin Ointment 1 x Per Week/30 Days Discharge Instructions: Apply Mupirocin (Bactroban) as instructed Prim Dressing: [place],  4x4 (in/in) 1 x Per Week/30 Days ary Discharge Instructions: Apply to wound bed as instructed Secondary Dressing: Woven Gauze Sponge, Non-Sterile 4x4 in 1 x Per Week/30 Days Discharge Instructions: Apply over primary dressing as directed. Secondary Dressing: Zetuvit Plus 4x8 in 1 x Per Week/30 Days Discharge Instructions: Apply over primary dressing as directed. Secured With: Transpore Surgical T ape, 2x10 (in/yd) 1 x Per Week/30 Days Discharge Instructions: Secure dressing with tape as directed. Com pression Wrap: Unnaboot w/Calamine, 4x10 (in/yd) 1 x Per Week/30 Days Discharge Instructions: Apply Unnaboot as directed. Com pression Wrap: Kerlix Roll 4.5x3.1 (in/yd) 1 x Per Week/30 Days Discharge Instructions: Apply Kerlix and Coban compression as directed. Com pression Wrap: Coban Self-Adherent Wrap 4x5 (in/yd) 1 x Per Week/30 Days Discharge Instructions: Apply over Kerlix as directed. Com pression Stockings: Jobst Farrow Wrap 4000 Compression Amount: 30-40 mmHg (left) Discharge Instructions: Apply Francia Greaves daily as instructed. Apply first thing in the morning, remove at night before bed. WOUND #5: - Lower Leg Wound Laterality: Right, Anterior Cleanser: Soap and Water 1 x Per Week/30 Days Discharge Instructions: May shower and wash wound with dial antibacterial soap and water prior to dressing change. Cleanser: Wound Cleanser 1 x Per Week/30 Days Discharge Instructions: Cleanse the wound with wound cleanser prior to applying [name] clean dressing using gauze sponges, not tissue or cotton balls. Peri-Wound Care: Sween Lotion (Moisturizing lotion) 1 x Per Week/30 Days Discharge Instructions: Apply moisturizing lotion as directed Topical: Mupirocin Ointment 1 x Per Week/30  Days Discharge Instructions: Apply Mupirocin (Bactroban) as instructed Prim Dressing: Sorbalgon AG Dressing, 4x4 (in/in) 1 x Per Week/30 Days ary Discharge Instructions: Apply to wound bed as instructed Secondary Dressing: Woven Gauze Sponge, Non-Sterile 4x4 in 1 x Per Week/30 Days Discharge Instructions: Apply over primary dressing as directed. Secondary Dressing: Zetuvit Plus 4x8 in 1 x Per Week/30 Days Discharge Instructions: Apply over primary dressing as directed. Secured With: Transpore Surgical T ape, 2x10 (in/yd) 1 x Per Week/30 Days Discharge Instructions: Secure dressing with tape as directed. Com pression Wrap: Unnaboot w/Calamine, 4x10 (in/yd) 1 x Per Week/30 Days [name], [name] ([id number]) [id number] Page 11 of 13 Discharge Instructions: Apply Unnaboot as directed. Com pression Wrap: Kerlix Roll 4.5x3.1 (in/yd) 1 x Per Week/30 Days Discharge Instructions: Apply Kerlix and Coban compression as directed. Com pression Wrap: Coban Self-Adherent Wrap 4x5 (in/yd) 1 x Per Week/30 Days Discharge Instructions: Apply over Kerlix as directed. WOUND #6: - Lower Leg Wound Laterality: Right, Anterior, Proximal Cleanser: Soap and Water 1 x Per Week/30 Days Discharge Instructions: May shower and wash wound with dial antibacterial soap and water prior to dressing change. Cleanser: Wound Cleanser 1 x Per Week/30 Days Discharge Instructions: Cleanse the wound with wound cleanser prior to applying [name] clean dressing using gauze sponges, not tissue or cotton balls. Peri-Wound Care: Sween Lotion (Moisturizing lotion) 1 x Per Week/30 Days Discharge Instructions: Apply moisturizing lotion as directed Topical: Mupirocin Ointment 1 x Per Week/30 Days Discharge Instructions: Apply Mupirocin (Bactroban) as instructed Prim Dressing: Sorbalgon AG Dressing, 4x4 (in/in) 1 x Per Week/30 Days ary Discharge Instructions: Apply to wound bed as instructed Secondary Dressing:  Woven Gauze Sponge, Non-Sterile 4x4 in 1 x Per Week/30 Days Discharge Instructions: Apply over primary dressing as directed. Secondary Dressing: Zetuvit Plus 4x8 in 1 x Per Week/30 Days Discharge Instructions: Apply over primary dressing as directed. Secured With: Transpore Surgical T ape, 2x10 (in/yd) 1 x Per Week/30 Days Discharge Instructions: Secure dressing with tape as directed. Com pression  Wrap: Unnaboot w/Calamine, 4x10 (in/yd) 1 x Per Week/30 Days Discharge Instructions: Apply Unnaboot as directed. Com pression Wrap: Kerlix Roll 4.5x3.1 (in/yd) 1 x Per Week/30 Days Discharge Instructions: Apply Kerlix and Coban compression as directed. Com pression Wrap: Coban Self-Adherent Wrap 4x5 (in/yd) 1 x Per Week/30 Days Discharge Instructions: Apply over Kerlix as directed. 04/10/2022: The right dorsal foot wound has healed. Unfortunately, his right-sided wrap slid and above this, he developed significant swelling and now has blisters and several new open wounds with slough accumulation. The left anterior leg wound is small with slough and eschar buildup. I used [name] curette to debride eschar and slough from the left anterior tibial wound, as well as from the more distal right lower leg wound. I debrided slough and nonviable subcutaneous tissue from the new proximal right lower leg wounds. We will continue to use silver alginate to all open wounds. We will use full leg first layer Unna boot to try and minimize the slippage of his wraps. I think he would benefit from lymphedema pumps given his stage II-III lymphedema. We will work on getting these approved and ordered. Follow-up in 1 week. Electronic Signature(s) Signed: 04/10/2022 3:14:35 PM By: [name] MD FACS Entered By: [name] on 04/10/2022 15:14:34 -------------------------------------------------------------------------------- HxROS Details Patient Name: Date of Service: [name], [name]. 04/10/2022 1:45 PM Medical Record  Number: [id number] Patient Account Number: [id number] Date of Birth/Sex: Treating RN: 01-11-1946 (77 y.o. M) Primary Care Provider: [name] Other Clinician: Referring Provider: Treating Provider/Extender: [name] in Treatment: 8 Information Obtained From Patient Constitutional Symptoms (General Health) Medical History: Past Medical History Notes: obsesity Hematologic/Lymphatic Medical History: Positive for: Anemia; Lymphedema Past Medical History Notes: chronic anticoagulation Respiratory Medical History: Positive for: Sleep Apnea [place], Tobi [name] ([id number]) [id number] Page 12 of 13 Cardiovascular Medical History: Positive for: Arrhythmia - PAF; Congestive Heart Failure - diastolic; Hypertension Past Medical History Notes: pericarditis Gastrointestinal Medical History: Past Medical History Notes: pt says 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 but they could not prove it Endocrine Medical History: Positive for: Type II Diabetes - takes no meds, "borderline A1c" Time with diabetes: 2-3 years ago Blood sugar tested every day: No Genitourinary Medical History: Past Medical History Notes: BPH Neurologic Medical History: Positive for: Neuropathy Past Medical History Notes: 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, embolic cerebral infarction, CVA Oncologic Medical History: Negative for: Received Chemotherapy; Received Radiation Psychiatric Medical History: Past Medical History Notes: depression Immunizations Pneumococcal Vaccine: Received Pneumococcal Vaccination: Yes Received Pneumococcal Vaccination On or After 60th Birthday: Yes Implantable Devices None Hospitalization / Surgery History Type of Hospitalization/Surgery right and left heart cath colonoscopy esophagogastroduodenoscopy subxyphoid pericardial window intraoperative transesophageal echocardiogram laparoscopic gastric banding with  hiatal hernia repair breath tek h pylori cholecysectomy arthroscopy knee w/ drilling lithotripsy tonsillectomy total knee replacement trigger finger repair wisdom tooth extraction Family and Social History Cancer: Yes - Paternal Grandparents; Diabetes: No; Heart Disease: Yes - Maternal Grandparents,Father; Hereditary Spherocytosis: No; Hypertension: No; Kidney Disease: No; Lung Disease: No; Seizures: No; Stroke: No; Thyroid Problems: No; Tuberculosis: No; Former smoker - smoked as [name] teen; Marital Status - Married; Alcohol Use: Never; Drug Use: No History; Caffeine Use: Never; Financial Concerns: No; Food, Clothing or Shelter Needs: No; Support System Lacking: No; Transportation Concerns: No [place], Tally [name] ([id number]) [id number] Page 13 of 13 Electronic Signature(s) Signed: 04/10/2022 5:11:08 PM By: [name] MD FACS Entered By: [name] on 04/10/2022 15:11:48 -------------------------------------------------------------------------------- SuperBill Details Patient Name: Date of Service:  [name], Tyberius [name]. 04/10/2022 Medical Record Number: [id number] Patient Account Number: [id number] Date of Birth/Sex: Treating RN: 1946/02/24 (77 y.o. M) Primary Care Provider: [name] Other Clinician: Referring Provider: Treating Provider/Extender: [name] in Treatment: 8 Diagnosis Coding ICD-10 Codes Code Description [phone] 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d L97.822 Non-pressure chronic ulcer of other part of left lower leg with fat layer exposed I50.30 Unspecified diastolic (congestive) heart failure J96.11 Chronic respiratory failure with hypoxia L03.115 Cellulitis of right lower limb E11.621 Type 2 diabetes mellitus with foot ulcer E66.01 Morbid (severe) obesity due to excess calories I89.0 Lymphedema, not elsewhere classified Facility Procedures : CPT4 Code:  [id number] Description: [place] - DEB SUBQ TISSUE 20 SQ CM/< ICD-10 Diagnosis Description F68.127 Non-pressure chronic ulcer of other part of right lower leg with fat layer expos Modifier: ed Quantity: 1 : CPT4 Code: [id number] Description: 94496 - DEBRIDE WOUND 1ST 20 SQ CM OR < ICD-10 Diagnosis Description L97.812 Non-pressure chronic ulcer of other part of right lower leg with fat layer expos L97.822 Non-pressure chronic ulcer of other part of left lower leg with fat layer  expose Modifier: ed d Quantity: 1 Physician Procedures : CPT4 Code Description Modifier [id number] - WC PHYS LEVEL 3 - EST PT 25 ICD-10 Diagnosis Description L97.812 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d L97.822 Non-pressure chronic ulcer of other part of left  lower leg with fat layer exposed Quantity: 1 : [id number] - WC PHYS SUBQ TISS 20 SQ CM ICD-10 Diagnosis Description L97.812 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d Quantity: 1 : [id number] - WC PHYS DEBR WO ANESTH 20 SQ CM ICD-10 Diagnosis Description U63.335 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d L97.822 Non-pressure chronic ulcer of other part of left lower leg with fat layer  exposed Quantity: 1 Electronic Signature(s) Signed: 04/10/2022 3:16:46 PM By: [name] MD FACS Entered By: [name] on 04/10/2022 15:16:46

## 2022-04-11 NOTE — Progress Notes (Signed)
[name], [name] ([id number]) [id number] Page 1 of 14 Visit Report for 04/10/2022 Arrival Information Details Patient Name: Date of Service: [name]. 04/10/2022 1:45 PM Medical Record Number: [id number] Patient Account Number: [id number] Date of Birth/Sex: Treating RN: 23-Mar-1946 (77 y.o. [name] Primary Care Susette Seminara: [name] Other Clinician: Referring Crystalyn [name]: Treating Yeila Morro/Extender: [name] in Treatment: 8 Visit Information History Since Last Visit Added or deleted any medications: No Patient Arrived: Wheel Chair Any new allergies or adverse reactions: No Arrival Time: 13:59 Had [name] or experienced change in No Accompanied By: wife activities of daily living that may affect Transfer Assistance: None risk of falls: Patient Identification Verified: Yes Signs or symptoms of abuse/neglect since last visito No Secondary Verification Process Completed: Yes Hospitalized since last visit: No Patient Requires Transmission-Based Precautions: No Implantable device outside of the clinic excluding No Patient Has Alerts: Yes cellular tissue based products placed in the center Patient Alerts: Patient on Blood Thinner since last visit: Has Dressing in Place as Prescribed: Yes Has Compression in Place as Prescribed: Yes Pain Present Now: No Electronic Signature(s) Signed: 04/10/2022 5:16:32 PM By: Sharyn Creamer RN, BSN Entered By: Sharyn Creamer on 04/10/2022 14:00:25 -------------------------------------------------------------------------------- Compression Therapy Details Patient Name: Date of Service: [name]. 04/10/2022 1:45 PM Medical Record Number: [id number] Patient Account Number: [id number] Date of Birth/Sex: Treating RN: Nov 23, 1945 (77 y.o. [name] Primary Care Najia Hurlbutt: [name] Other Clinician: Referring Greysyn Vanderberg: Treating Maribel Hadley/Extender: [name] in Treatment: 8 Compression Therapy Performed for Wound Assessment: Wound #1 Right,Dorsal Foot Performed By: Clinician Sharyn Creamer, RN Compression Type: Rolena Infante Post Procedure Diagnosis Same as Pre-procedure Electronic Signature(s) Signed: 04/10/2022 5:16:32 PM By: Sharyn Creamer RN, BSN Entered By: Sharyn Creamer on 04/10/2022 14:42:45 [name] ([id number]) [id number] Page 2 of 14 -------------------------------------------------------------------------------- Compression Therapy Details Patient Name: Date of Service: [name], [name]. 04/10/2022 1:45 PM Medical Record Number: [id number] Patient Account Number: [id number] Date of Birth/Sex: Treating RN: 1946/01/23 (77 y.o. [name] Primary Care Inger Wiest: [name] Other Clinician: Referring Tauri Ethington: Treating Naliya Gish/Extender: [name] in Treatment: 8 Compression Therapy Performed for Wound Assessment: Wound #4 Left,Anterior Lower Leg Performed By: Clinician Sharyn Creamer, RN Compression Type: Rolena Infante Post Procedure Diagnosis Same as Pre-procedure Electronic Signature(s) Signed: 04/10/2022 5:16:32 PM By: Sharyn Creamer RN, BSN Entered By: Sharyn Creamer on 04/10/2022 14:42:45 -------------------------------------------------------------------------------- Compression Therapy Details Patient Name: Date of Service: [name] [name], [name]. 04/10/2022 1:45 PM Medical Record Number: [id number] Patient Account Number: [id number] Date of Birth/Sex: Treating RN: [birth year] (77 y.o. [name] Primary Care Ailee Pates: [name] Other Clinician: Referring Lynda Wanninger: Treating Shabana Armentrout/Extender: [name] in Treatment: 8 Compression Therapy Performed for Wound Assessment: Wound #5 Right,Anterior Lower Leg Performed By: Clinician Sharyn Creamer, RN Compression Type: Rolena Infante Post Procedure  Diagnosis Same as Pre-procedure Electronic Signature(s) Signed: 04/10/2022 5:16:32 PM By: Sharyn Creamer RN, BSN Entered By: Sharyn Creamer on 04/10/2022 14:42:45 -------------------------------------------------------------------------------- Compression Therapy Details Patient Name: Date of Service: [name] [name], [name]. 04/10/2022 1:45 PM Medical Record Number: [id number] Patient Account Number: [id number] Date of Birth/Sex: Treating RN: 1945/12/23 (77 y.o. [name] Primary Care Salene Mohamud: [name] Other Clinician: Referring Claretta Kendra: Treating Masami Plata/Extender: [name] in Treatment: 8 Compression Therapy Performed for Wound Assessment: Wound #6 Right,Proximal,Anterior Lower Leg Performed By: Clinician Sharyn Creamer, RN Compression Type: Rolena Infante Post Procedure Diagnosis Same  as Therapist, sports) Signed: 04/10/2022 5:16:32 PM By: Sharyn Creamer RN, BSN [name], [name] ([id number]) By: Sharyn Creamer RN, BSN [phone].pdf Page 3 of 14 Signed: 04/10/2022 5:16:32 PM Entered By: Sharyn Creamer on 04/10/2022 14:42:45 -------------------------------------------------------------------------------- Encounter Discharge Information Details Patient Name: Date of Service: [place]. 04/10/2022 1:45 PM Medical Record Number: [id number] Patient Account Number: [id number] Date of Birth/Sex: Treating RN: 15-Feb-1946 (77 y.o. [name] Primary Care Arshad Oberholzer: [name] Other Clinician: Referring Qusay Villada: Treating Sharetta Ricchio/Extender: [name] in Treatment: 8 Encounter Discharge Information Items Post Procedure Vitals Discharge Condition: Stable Temperature (F): 98.5 Ambulatory Status: Wheelchair Pulse (bpm): 62 Discharge Destination: Home Respiratory Rate (breaths/min): 18 Transportation: Private Auto Blood Pressure (mmHg): 143/78 Accompanied By:  wife Schedule Follow-up Appointment: Yes Clinical Summary of Care: Patient Declined Electronic Signature(s) Signed: 04/10/2022 5:16:32 PM By: Sharyn Creamer RN, BSN Entered By: Sharyn Creamer on 04/10/2022 16:57:21 -------------------------------------------------------------------------------- Lower Extremity Assessment Details Patient Name: Date of Service: [name] [name], Yacoub [name]. 04/10/2022 1:45 PM Medical Record Number: [id number] Patient Account Number: [id number] Date of Birth/Sex: Treating RN: 10-14-1945 (77 y.o. [name] Primary Care Desirai Traxler: [name] Other Clinician: Referring Klair Leising: Treating Fouad Taul/Extender: [name] in Treatment: 8 Edema Assessment Assessed: [name]: No] [name]: No] [Left: Edema] [Right: :] Calf Left: Right: Point of Measurement: From Medial Instep 50.5 cm 54 cm Ankle Left: Right: Point of Measurement: From Medial Instep 31 cm 34 cm Vascular Assessment Pulses: Dorsalis Pedis Palpable: [Left:Yes] [Right:Yes] Electronic Signature(s) Signed: 04/10/2022 5:16:32 PM By: Sharyn Creamer RN, BSN Entered By: Sharyn Creamer on 04/10/2022 14:32:03 Anastas, [name] ([id number]) [id number] Page 4 of 14 -------------------------------------------------------------------------------- Multi Wound Chart Details Patient Name: Date of Service: [name]. 04/10/2022 1:45 PM Medical Record Number: [id number] Patient Account Number: [id number] Date of Birth/Sex: Treating RN: May 14, 1945 (77 y.o. 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 Lymphedema Primary Etiology: Extremity Anemia, Lymphedema, Sleep Apnea, Anemia, Lymphedema, Sleep Apnea, Anemia, Lymphedema, Sleep Apnea, Comorbid History: Arrhythmia, Congestive Heart Failure, Arrhythmia, Congestive Heart Failure, Arrhythmia, Congestive Heart Failure, Hypertension, Type II Diabetes, Hypertension, Type II Diabetes, Hypertension, Type II Diabetes, Neuropathy Neuropathy Neuropathy 01/31/2022 02/13/2021 03/30/2022 Date Acquired: '8 8 1 '$ Weeks of Treatment: Open Open Open Wound Status: No No No Wound Recurrence: No No No Clustered Wound: [name]/[name] [name]/[name] [name]/[name] Clustered Quantity: 0x0x0 0.8x0.5x0.1 1x0.4x0.1 Measurements L x W x D (cm) 0 0.314 0.314 [name] (cm) : rea 0 0.031 0.031 Volume (cm) : 100.00% 96.20% 20.10% % Reduction in [name] rea: 100.00% 96.20% 20.50% % Reduction in Volume: Grade 1 Full Thickness Without Exposed Full Thickness Without Exposed Classification: Support Structures Support Structures None Present Medium Medium Exudate [name] mount: [name]/[name] Serous Serosanguineous Exudate Type: [name]/[name] amber red, brown Exudate Color: Distinct, outline attached Distinct, outline attached Distinct, outline attached Wound Margin: None Present (0%) Medium (34-66%) Large (67-100%) Granulation [name] mount: [name]/[name] Red, Pink Red Granulation Quality: None Present (0%) Medium (34-66%) Small (1-33%) Necrotic [name] mount: [name]/[name] Eschar, Adherent Slough Adherent Slough Necrotic Tissue: Fascia: No Fat Layer (Subcutaneous Tissue): Yes Fat Layer (Subcutaneous Tissue): Yes Exposed Structures: Fat Layer (Subcutaneous Tissue): No Fascia:  No Fascia: No Tendon: No Tendon: No Tendon: No Muscle: No Muscle: No Muscle: No Joint: No Joint: No Joint: No Bone: No Bone: No Bone: No Large (67-100%) Small (1-33%) None Epithelialization: [name]/[name] Debridement - Selective/Open Wound  Debridement - Selective/Open Wound Debridement: Pre-procedure Verification/Time Out [name]/[name] 14:34 14:34 Taken: [name]/[name] Lidocaine 5% topical ointment Lidocaine 5% topical ointment Pain Control: [name]/[name] Necrotic/Eschar, Psychologist, prison and probation services, Slough Tissue Debrided: [name]/[name] Non-Viable Tissue Non-Viable Tissue Level: [name]/[name] 0.4 0.4 Debridement [name] (sq cm): rea [name]/[name] Curette Curette Instrument: [name]/[name] Minimum Minimum Bleeding: Aikman, [name] ([id number]) [id number] Page 5 of 14 [name]/[name] Pressure Pressure Hemostasis [name] chieved: [name]/[name] 0 0 Procedural Pain: [name]/[name] 0 0 Post Procedural Pain: [name]/[name] Procedure was tolerated well Procedure was tolerated well Debridement Treatment Response: [name]/[name] 0.8x0.5x0.1 1x0.4x0.1 Post Debridement Measurements L x W x D (cm) [name]/[name] 0.031 0.031 Post Debridement Volume: (cm) No Abnormalities Noted No Abnormalities Noted No Abnormalities Noted Periwound Skin Texture: Dry/Scaly: Yes Dry/Scaly: No No Abnormalities Noted Periwound Skin Moisture: Maceration: No Hemosiderin Staining: Yes Erythema: Yes Hemosiderin Staining: Yes Periwound Skin Color: Erythema: No Hemosiderin Staining: Yes No Abnormality No Abnormality No Abnormality Temperature: Compression Therapy Compression Therapy Compression Therapy Procedures Performed: Debridement Debridement Wound Number: 6 [name]/[name] [name]/[name] Photos: No Photos [name]/[name] [name]/[name] Right, Proximal, Anterior Lower Leg [name]/[name] [name]/[name] Wound Location: Blister [name]/[name] [name]/[name] Wounding Event: Lymphedema [name]/[name] [name]/[name] Primary Etiology: Anemia, Lymphedema, Sleep Apnea, [name]/[name] [name]/[name] Comorbid History: Arrhythmia, Congestive Heart Failure, Hypertension, Type II Diabetes, Neuropathy 04/10/2022 [name]/[name] [name]/[name] Date Acquired: 0 [name]/[name] [name]/[name] Weeks of Treatment: Open [name]/[name] [name]/[name] Wound Status: No [name]/[name] [name]/[name] Wound Recurrence: Yes [name]/[name] [name]/[name] Clustered Wound: 3 [name]/[name] [name]/[name] Clustered Quantity: 3x5x0.1 [name]/[name] [name]/[name] Measurements L x W x D (cm) 11.781 [name]/[name] [name]/[name] [name] (cm) : rea 1.178 [name]/[name] [name]/[name] Volume (cm)  : [name]/[name] [name]/[name] [name]/[name] % Reduction in [name] rea: [name]/[name] [name]/[name] [name]/[name] % Reduction in Volume: Full Thickness Without Exposed [name]/[name] [name]/[name] Classification: Support Structures Medium [name]/[name] [name]/[name] Exudate [name] mount: Serosanguineous [name]/[name] [name]/[name] Exudate Type: red, brown [name]/[name] [name]/[name] Exudate Color: [name]/[name] [name]/[name] [name]/[name] Wound Margin: Large (67-100%) [name]/[name] [name]/[name] Granulation [name] mount: Red [name]/[name] [name]/[name] Granulation Quality: Small (1-33%) [name]/[name] [name]/[name] Necrotic [name] mount: Adherent Slough [name]/[name] [name]/[name] Necrotic Tissue: Fat Layer (Subcutaneous Tissue): Yes [name]/[name] [name]/[name] Exposed Structures: Fascia: No Tendon: No Muscle: No Joint: No Bone: No None [name]/[name] [name]/[name] Epithelialization: Debridement - Excisional [name]/[name] [name]/[name] Debridement: Pre-procedure Verification/Time Out 14:34 [name]/[name] [name]/[name] Taken: Lidocaine 5% topical ointment [name]/[name] [name]/[name] Pain Control: Subcutaneous, Slough [name]/[name] [name]/[name] Tissue Debrided: Skin/Subcutaneous Tissue [name]/[name] [name]/[name] Level: 15 [name]/[name] [name]/[name] Debridement [name] (sq cm): rea Curette [name]/[name] [name]/[name] Instrument: Minimum [name]/[name] [name]/[name] Bleeding: Pressure [name]/[name] [name]/[name] Hemostasis [name] chieved: 0 [name]/[name] [name]/[name] Procedural Pain: 0 [name]/[name] [name]/[name] Post Procedural Pain: Procedure was tolerated well [name]/[name] [name]/[name] Debridement Treatment Response: 3x5x0.1 [name]/[name] [name]/[name] Post Debridement Measurements L x W x D (cm) 1.178 [name]/[name] [name]/[name] Post Debridement Volume: (cm) Erythema: Yes [name]/[name] [name]/[name] Periwound Skin Color: [name]/[name] [name]/[name] [name]/[name] Temperature: Compression Therapy [name]/[name] [name]/[name] Procedures Performed: Debridement Treatment Notes Electronic Signature(s) Signed: 04/10/2022 3:09:59 PM By: [name] MD FACS Entered By: [name] on 04/10/2022 15:09:59 [name], [place] ([id number]) [id number] Page 6 of 14 -------------------------------------------------------------------------------- Multi-Disciplinary Care Plan Details Patient Name: Date of Service: TALBOT, MONARCH [name]. 04/10/2022 1:45 PM Medical Record Number: [id number] Patient Account Number: [id number] Date of Birth/Sex: Treating RN: [birth year] (77 y.o. [name] Primary Care Rochanda Harpham: [name] Other Clinician: Referring Ankith Edmonston: Treating Cheron Pasquarelli/Extender: [name] in Treatment: 8 Active Inactive Necrotic  Tissue Nursing Diagnoses: Impaired tissue integrity related to necrotic/devitalized tissue Knowledge deficit related to management of necrotic/devitalized tissue Goals: Necrotic/devitalized tissue will be minimized in the wound bed Date Initiated: 02/13/2022 Target Resolution Date: 04/07/2022 Goal Status: Active Patient/caregiver will verbalize understanding of reason and process for debridement of necrotic tissue Date Initiated: 02/13/2022 Target Resolution Date: 04/07/2022 Goal Status: Active Interventions: Assess patient pain level pre-, during and post procedure and prior to discharge Provide education on necrotic tissue and debridement process Treatment Activities: Apply topical anesthetic as ordered : 02/13/2022 Notes: Wound/Skin Impairment Nursing Diagnoses: Impaired tissue integrity Knowledge deficit related to ulceration/compromised skin integrity Goals: Patient/caregiver will verbalize understanding of skin care regimen Date Initiated: 02/13/2022 Target Resolution Date: 04/07/2022 Goal Status: Active Interventions: Assess ulceration(s) every visit Treatment Activities: Skin care regimen initiated : 02/13/2022 Topical wound management initiated : 02/13/2022 Notes: Electronic Signature(s) Signed: 04/10/2022 5:16:32 PM By: Sharyn Creamer RN, BSN Entered By: Sharyn Creamer on 04/10/2022 14:37:31 Pain Assessment Details -------------------------------------------------------------------------------- [name] ([id number]) [id number] Page 7 of 14 Patient Name: Date of Service: BLADEN, UMAR [name]. 04/10/2022 1:45 PM Medical Record Number: [id number] Patient Account Number: [id number] Date of Birth/Sex: Treating RN: 29-Apr-1945 (77 y.o. [name] Primary Care Shanan Fitzpatrick: [name] Other Clinician: Referring Chalet Kerwin: Treating Lauretta Sallas/Extender: [name] in Treatment: 8 Active Problems Location of Pain Severity and Description of Pain Patient Has Paino No Site Locations Pain Management and Medication Current Pain Management: Electronic Signature(s) Signed: 04/10/2022 5:16:32 PM By: Sharyn Creamer RN, BSN Entered By: Sharyn Creamer on 04/10/2022 14:02:07 -------------------------------------------------------------------------------- Patient/Caregiver Education Details Patient Name: Date of Service: [name] Corinda Gubler [name]. 1/8/2024andnbsp1:45 PM Medical Record Number: [id number] Patient Account Number: [id number] Date of Birth/Gender: Treating RN: Jun 09, 1945 (77 y.o. [name] Primary Care Physician: [name] Other Clinician: Referring Physician: Treating Physician/Extender: [name] in Treatment: 8 Education Assessment Education Provided To: Patient Education Topics Provided Wound/Skin Impairment: Methods: Explain/Verbal Responses: State content correctly Electronic Signature(s) Signed: 04/10/2022 5:16:32 PM By: Sharyn Creamer RN, BSN Entered By: Sharyn Creamer on 04/10/2022 14:37:51 [place], [name] ([id number]) [id number] Page 8 of 14 -------------------------------------------------------------------------------- Wound Assessment Details Patient Name: Date of Service: [name], [name]. 04/10/2022 1:45 PM Medical Record Number: [id number] Patient Account Number: [id number] Date of Birth/Sex: Treating RN: Apr 18, 1945 (77 y.o. [name] Primary Care Laticha Ferrucci: [name] Other Clinician: Referring Ines Warf: Treating Kylian Loh/Extender: [name] in Treatment: 8 Wound Status Wound Number: 1 Primary Diabetic Wound/Ulcer of the Lower Extremity Etiology: Wound Location: Right,  Dorsal Foot Wound Open Wounding Event: Blister Status: Date Acquired: 01/31/2022 Comorbid Anemia, Lymphedema, Sleep Apnea, Arrhythmia, Congestive Heart Weeks Of Treatment: 8 History: Failure, Hypertension, Type II Diabetes, Neuropathy Clustered Wound: No Photos Wound Measurements Length: (cm) Width: (cm) Depth: (cm) Area: (cm) Volume: (cm) 0 % Reduction in Area: 100% 0 % Reduction in Volume: 100% 0 Epithelialization: Large (67-100%) 0 Tunneling: No 0 Undermining: No Wound Description Classification: Grade 1 Wound Margin: Distinct, outline attached Exudate Amount: None Present Foul Odor After Cleansing: No Slough/Fibrino No Wound Bed Granulation Amount: None Present (0%) Exposed Structure Necrotic Amount: None Present (0%) Fascia Exposed: No Fat Layer (Subcutaneous Tissue) Exposed: No Tendon Exposed: No Muscle Exposed: No Joint Exposed: No Bone Exposed: No Periwound Skin Texture Texture Color No Abnormalities Noted: Yes No Abnormalities Noted: No Erythema: No Moisture Hemosiderin Staining: Yes No Abnormalities Noted: No Dry / Scaly: Yes Temperature / Pain Maceration: No Temperature: No Abnormality Electronic  Signature(s) Signed: 04/10/2022 5:16:32 PM By: Sharyn Creamer RN, BSN Entered By: Sharyn Creamer on 04/10/2022 14:17:06 Granja, [name] ([id number]) [id number] Page 9 of 14 -------------------------------------------------------------------------------- Wound Assessment Details Patient Name: Date of Service: [name]. 04/10/2022 1:45 PM Medical Record Number: [id number] Patient Account Number: [id number] Date of Birth/Sex: Treating RN: [birth year] (77 y.o. [name] Primary Care Redonna Wilbert: [name] Other Clinician: Referring Mileah Hemmer: Treating Paeton Latouche/Extender: [name] in Treatment: 8 Wound Status Wound Number: 4 Primary Lymphedema Etiology: Wound Location: Left, Anterior  Lower Leg Wound Open Wounding Event: Blister Status: Date Acquired: 02/13/2021 Comorbid Anemia, Lymphedema, Sleep Apnea, Arrhythmia, Congestive Heart Weeks Of Treatment: 8 History: Failure, Hypertension, Type II Diabetes, Neuropathy Clustered Wound: No Photos Wound Measurements Length: (cm) 0.8 Width: (cm) 0.5 Depth: (cm) 0.1 Area: (cm) 0.314 Volume: (cm) 0.031 % Reduction in Area: 96.2% % Reduction in Volume: 96.2% Epithelialization: Small (1-33%) Tunneling: No Undermining: No Wound Description Classification: Full Thickness Without Exposed Suppor Wound Margin: Distinct, outline attached Exudate Amount: Medium Exudate Type: Serous Exudate Color: amber t Structures Foul Odor After Cleansing: No Slough/Fibrino Yes Wound Bed Granulation Amount: Medium (34-66%) Exposed Structure Granulation Quality: Red, Pink Fascia Exposed: No Necrotic Amount: Medium (34-66%) Fat Layer (Subcutaneous Tissue) Exposed: Yes Necrotic Quality: Eschar, Adherent Slough Tendon Exposed: No Muscle Exposed: No Joint Exposed: No Bone Exposed: No Periwound Skin Texture Texture Color No Abnormalities Noted: Yes No Abnormalities Noted: No Erythema: Yes Moisture Hemosiderin Staining: Yes No Abnormalities Noted: No Dry / Scaly: No Temperature / Pain Temperature: No Abnormality Treatment Notes Wound #4 (Lower Leg) Wound Laterality: Left, Anterior Cleanser Yazdani, [name] ([id number]) [id number] Page 10 of 14 Soap and Water Discharge Instruction: May shower and wash wound with dial antibacterial soap and water prior to dressing change. Wound Cleanser Discharge Instruction: Cleanse the wound with wound cleanser prior to applying [name] clean dressing using gauze sponges, not tissue or cotton balls. Peri-Wound Care Sween Lotion (Moisturizing lotion) Discharge Instruction: Apply moisturizing lotion as directed Topical Mupirocin Ointment Discharge Instruction: Apply  Mupirocin (Bactroban) as instructed Primary Dressing Sorbalgon AG Dressing, 4x4 (in/in) Discharge Instruction: Apply to wound bed as instructed Secondary Dressing Woven Gauze Sponge, Non-Sterile 4x4 in Discharge Instruction: Apply over primary dressing as directed. Zetuvit Plus 4x8 in Discharge Instruction: Apply over primary dressing as directed. Secured With Transpore Surgical Tape, 2x10 (in/yd) Discharge Instruction: Secure dressing with tape as directed. Compression Wrap Unnaboot w/Calamine, 4x10 (in/yd) Discharge Instruction: Apply Unnaboot as directed. Kerlix Roll 4.5x3.1 (in/yd) Discharge Instruction: Apply Kerlix and Coban compression as directed. Coban Self-Adherent Wrap 4x5 (in/yd) Discharge Instruction: Apply over Kerlix as directed. Compression Stockings Jobst Farrow Wrap 4000 Quantity: 1 Left Leg Compression Amount: 30-40 mmHg Discharge Instruction: Apply Francia Greaves daily as instructed. Apply first thing in the morning, remove at night before bed. Add-Ons Electronic Signature(s) Signed: 04/10/2022 5:16:32 PM By: Sharyn Creamer RN, BSN Entered By: Sharyn Creamer on 04/10/2022 14:20:25 -------------------------------------------------------------------------------- Wound Assessment Details Patient Name: Date of Service: [name] [name], [name]. 04/10/2022 1:45 PM Medical Record Number: [id number] Patient Account Number: [id number] Date of Birth/Sex: Treating RN: 15-Oct-1945 (77 y.o. [name] Primary Care Hudson Lehmkuhl: [name] Other Clinician: Referring Evadne Ose: Treating Chianne Byrns/Extender: [name] in Treatment: 8 Wound Status Wound Number: 5 Primary Lymphedema Etiology: Wound Location: Right, Anterior Lower Leg Wound Open Wounding Event: Gradually Appeared Status: Date Acquired: 03/30/2022 Comorbid Anemia, Lymphedema, Sleep Apnea, Arrhythmia, Congestive Heart Weeks Of Treatment: 1 History: Failure, Hypertension, Type II  Diabetes, Neuropathy Clustered Wound: No Photos Delay, Merric [name] ([id number]) [phone].pdf Page 11 of 14 Wound Measurements Length: (cm) 1 Width: (cm) 0.4 Depth: (cm) 0.1 Area: (cm) 0.314 Volume: (cm) 0.031 % Reduction in Area: 20.1% % Reduction in Volume: 20.5% Epithelialization: None Tunneling: No Undermining: No Wound Description Classification: Full Thickness Without Exposed Support Structures Wound Margin: Distinct, outline attached Exudate Amount: Medium Exudate Type: Serosanguineous Exudate Color: red, brown Foul Odor After Cleansing: No Slough/Fibrino No Wound Bed Granulation Amount: Large (67-100%) Exposed Structure Granulation Quality: Red Fascia Exposed: No Necrotic Amount: Small (1-33%) Fat Layer (Subcutaneous Tissue) Exposed: Yes Necrotic Quality: Adherent Slough Tendon Exposed: No Muscle Exposed: No Joint Exposed: No Bone Exposed: No Periwound Skin Texture Texture Color No Abnormalities Noted: Yes No Abnormalities Noted: No Hemosiderin Staining: Yes Moisture No Abnormalities Noted: Yes Temperature / Pain Temperature: No Abnormality Treatment Notes Wound #5 (Lower Leg) Wound Laterality: Right, Anterior Cleanser Soap and Water Discharge Instruction: May shower and wash wound with dial antibacterial soap and water prior to dressing change. Wound Cleanser Discharge Instruction: Cleanse the wound with wound cleanser prior to applying [name] clean dressing using gauze sponges, not tissue or cotton balls. Peri-Wound Care Sween Lotion (Moisturizing lotion) Discharge Instruction: Apply moisturizing lotion as directed Topical Mupirocin Ointment Discharge Instruction: Apply Mupirocin (Bactroban) as instructed Primary Dressing Sorbalgon AG Dressing, 4x4 (in/in) Discharge Instruction: Apply to wound bed as instructed Secondary Dressing Woven Gauze Sponge, Non-Sterile 4x4 in Discharge Instruction: Apply over primary dressing as  directed. Zetuvit Plus 4x8 in Discharge Instruction: Apply over primary dressing as directed. Secured With [employer], Antrell [name] ([id number]) [id number] Page 12 of 14 Transpore Surgical Tape, 2x10 (in/yd) Discharge Instruction: Secure dressing with tape as directed. Compression Wrap Unnaboot w/Calamine, 4x10 (in/yd) Discharge Instruction: Apply Unnaboot as directed. Kerlix Roll 4.5x3.1 (in/yd) Discharge Instruction: Apply Kerlix and Coban compression as directed. Coban Self-Adherent Wrap 4x5 (in/yd) Discharge Instruction: Apply over Kerlix as directed. Compression Stockings Add-Ons Electronic Signature(s) Signed: 04/10/2022 5:16:32 PM By: Sharyn Creamer RN, BSN Entered By: Sharyn Creamer on 04/10/2022 14:22:38 -------------------------------------------------------------------------------- Wound Assessment Details Patient Name: Date of Service: [name] [name], [name]. 04/10/2022 1:45 PM Medical Record Number: [id number] Patient Account Number: [id number] Date of Birth/Sex: Treating RN: [birth year] (77 y.o. [name] Primary Care Silver Parkey: [name] Other Clinician: Referring Izayah Miner: Treating Gina Leblond/Extender: [name] in Treatment: 8 Wound Status Wound Number: 6 Primary Lymphedema Etiology: Wound Location: Right, Proximal, Anterior Lower Leg Wound Open Wounding Event: Blister Status: Date Acquired: 04/10/2022 Comorbid Anemia, Lymphedema, Sleep Apnea, Arrhythmia, Congestive Heart Weeks Of Treatment: 0 History: Failure, Hypertension, Type II Diabetes, Neuropathy Clustered Wound: Yes Wound Measurements Length: (cm) 3 Width: (cm) 5 Depth: (cm) 0.1 Clustered Quantity: 3 Area: (cm) 11.781 Volume: (cm) 1.178 % Reduction in Area: % Reduction in Volume: Epithelialization: None Tunneling: No Undermining: No Wound Description Classification: Full Thickness Without Exposed Sup Exudate Amount: Medium Exudate Type:  Serosanguineous Exudate Color: red, brown port Structures Foul Odor After Cleansing: No Slough/Fibrino Yes Wound Bed Granulation Amount: Large (67-100%) Exposed Structure Granulation Quality: Red Fascia Exposed: No Necrotic Amount: Small (1-33%) Fat Layer (Subcutaneous Tissue) Exposed: Yes Necrotic Quality: Adherent Slough Tendon Exposed: No Muscle Exposed: No Joint Exposed: No Bone Exposed: No Periwound Skin Texture Texture Color No Abnormalities Noted: No No Abnormalities Noted: No Erythema: Yes Moisture No Abnormalities Noted: No [place], [name] ([id number]) [id number] Page 13 of 14 Treatment Notes Wound #6 (Lower Leg) Wound Laterality: Right, Anterior, Proximal Cleanser Soap and Water Discharge Instruction: May  shower and wash wound with dial antibacterial soap and water prior to dressing change. Wound Cleanser Discharge Instruction: Cleanse the wound with wound cleanser prior to applying [name] clean dressing using gauze sponges, not tissue or cotton balls. Peri-Wound Care Sween Lotion (Moisturizing lotion) Discharge Instruction: Apply moisturizing lotion as directed Topical Mupirocin Ointment Discharge Instruction: Apply Mupirocin (Bactroban) as instructed Primary Dressing Sorbalgon AG Dressing, 4x4 (in/in) Discharge Instruction: Apply to wound bed as instructed Secondary Dressing Woven Gauze Sponge, Non-Sterile 4x4 in Discharge Instruction: Apply over primary dressing as directed. Zetuvit Plus 4x8 in Discharge Instruction: Apply over primary dressing as directed. Secured With Transpore Surgical Tape, 2x10 (in/yd) Discharge Instruction: Secure dressing with tape as directed. Compression Wrap Unnaboot w/Calamine, 4x10 (in/yd) Discharge Instruction: Apply Unnaboot as directed. Kerlix Roll 4.5x3.1 (in/yd) Discharge Instruction: Apply Kerlix and Coban compression as directed. Coban Self-Adherent Wrap 4x5 (in/yd) Discharge Instruction:  Apply over Kerlix as directed. Compression Stockings Add-Ons Electronic Signature(s) Signed: 04/10/2022 5:16:32 PM By: Sharyn Creamer RN, BSN Entered By: Sharyn Creamer on 04/10/2022 14:12:28 -------------------------------------------------------------------------------- Vitals Details Patient Name: Date of Service: [place], [name]. 04/10/2022 1:45 PM Medical Record Number: [id number] Patient Account Number: [id number] Date of Birth/Sex: Treating RN: [birth year] (77 y.o. [name] Primary Care Joreen Swearingin: [name] Other Clinician: Referring Yomar Mejorado: Treating Letzy Gullickson/Extender: [name] in Treatment: 8 Vital Signs Time Taken: 14:00 Temperature (F): 98.5 Height (in): 72 Pulse (bpm): 62 Weight (lbs): 375 Respiratory Rate (breaths/min): 20 Body Mass Index (BMI): 50.9 Blood Pressure (mmHg): 143/78 Reference Range: 80 - 120 mg / dl Yagi, Kimari [name] ([id number]) [id number] Page 14 of 14 Electronic Signature(s) Signed: 04/10/2022 5:16:32 PM By: Sharyn Creamer RN, BSN Entered By: Sharyn Creamer on 04/10/2022 14:01:58

## 2022-04-17 ENCOUNTER — Ambulatory Visit (HOSPITAL_BASED_OUTPATIENT_CLINIC_OR_DEPARTMENT_OTHER): Payer: Medicare Other | Admitting: General Surgery

## 2022-04-25 ENCOUNTER — Other Ambulatory Visit: Payer: Self-pay

## 2022-04-25 ENCOUNTER — Ambulatory Visit (INDEPENDENT_AMBULATORY_CARE_PROVIDER_SITE_OTHER): Payer: Medicare Other

## 2022-04-25 ENCOUNTER — Encounter (HOSPITAL_BASED_OUTPATIENT_CLINIC_OR_DEPARTMENT_OTHER): Payer: Medicare Other | Admitting: General Surgery

## 2022-04-25 VITALS — Ht 72.0 in | Wt 370.0 lb

## 2022-04-25 DIAGNOSIS — J3801 Paralysis of vocal cords and larynx, unilateral: Secondary | ICD-10-CM | POA: Diagnosis not present

## 2022-04-25 DIAGNOSIS — I890 Lymphedema, not elsewhere classified: Secondary | ICD-10-CM | POA: Diagnosis not present

## 2022-04-25 DIAGNOSIS — L97822 Non-pressure chronic ulcer of other part of left lower leg with fat layer exposed: Secondary | ICD-10-CM | POA: Diagnosis not present

## 2022-04-25 DIAGNOSIS — Z0000 Encounter for general adult medical examination without abnormal findings: Secondary | ICD-10-CM

## 2022-04-25 DIAGNOSIS — L97812 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d: Secondary | ICD-10-CM | POA: Diagnosis not present

## 2022-04-25 DIAGNOSIS — E11621 Type 2 diabetes mellitus with foot ulcer: Secondary | ICD-10-CM | POA: Diagnosis not present

## 2022-04-25 DIAGNOSIS — L03115 Cellulitis of right lower limb: Secondary | ICD-10-CM | POA: Diagnosis not present

## 2022-04-25 MED ORDER — POTASSIUM CHLORIDE CRYS ER 20 MEQ PO TBCR: 20.0000 meq | EXTENDED_RELEASE_TABLET | Freq: Two times a day (BID) | ORAL | 3 refills | Status: DC

## 2022-04-25 NOTE — Patient Instructions (Addendum)
[name] , Thank you for taking time to come for your Medicare Wellness Visit. I appreciate your ongoing commitment to your health goals. Please review the following plan we discussed and let me know if I can assist you in the future.   These are the goals we discussed:  Goals       Manage My Medicine      Timeframe:  Long-Range Goal Priority:  Medium Start Date:   09/21/20                          Expected End Date:     04/23/22                  Follow Up Date 10/2021   - call for medicine refill 2 or 3 days before it runs out - call if I am sick and can't take my medicine - keep a list of all the medicines I take; vitamins and herbals too - use a pillbox to sort medicine  -contact [facility] pharmacist when out of pocket costs total > $1500   Why is this important?   These steps will help you keep on track with your medicines.      Patient Stated      I want to lose weight by increasing my physical activity by doing chair exercises.       Patient Stated (pt-stated)      My goal is to lose 60 pounds.        This is a list of the screening recommended for you and due dates:  Health Maintenance  Topic Date Due   Yearly kidney health urinalysis for diabetes  11/09/2021   Eye exam for diabetics  04/25/2022*   Complete foot exam   04/26/2022*   COVID-19 Vaccine (5 - 2023-24 season) 05/11/2022*   Zoster (Shingles) Vaccine (1 of 2) 07/25/2022*   Hemoglobin A1C  08/08/2022   Yearly kidney function blood test for diabetes  02/08/2023   Medicare Annual Wellness Visit  04/26/2023   DTaP/Tdap/Td vaccine (3 - Td or Tdap) 01/19/2026   Pneumonia Vaccine  Completed   Flu Shot  Completed   Hepatitis C Screening: USPSTF Recommendation to screen - Ages 18-79 yo.  Completed   HPV Vaccine  Aged Out   Colon Cancer Screening  Discontinued  *Topic was postponed. The date shown is not the original due date.   Opioid Pain Medicine Management Opioids are powerful medicines that are used to treat  moderate to severe pain. When used for short periods of time, they can help you to: Sleep better. Do better in physical or occupational therapy. Feel better in the first few days after an injury. Recover from surgery. Opioids should be taken with the supervision of a trained health care provider. They should be taken for the shortest period of time possible. This is because opioids can be addictive, and the longer you take opioids, the greater your risk of addiction. This addiction can also be called opioid use disorder. What are the risks? Using opioid pain medicines for longer than 3 days increases your risk of side effects. Side effects include: Constipation. Nausea and vomiting. Breathing difficulties (respiratory depression). Drowsiness. Confusion. Opioid use disorder. Itching. Taking opioid pain medicine for a long period of time can affect your ability to do daily tasks. It also puts you at risk for: Motor vehicle crashes. Depression. Suicide. Heart attack. Overdose, which can be life-threatening. What is a  pain treatment plan? A pain treatment plan is an agreement between you and your health care provider. Pain is unique to each person, and treatments vary depending on your condition. To manage your pain, you and your health care provider need to work together. To help you do this: Discuss the goals of your treatment, including how much pain you might expect to have and how you will manage the pain. Review the risks and benefits of taking opioid medicines. Remember that a good treatment plan uses more than one approach and minimizes the chance of side effects. Be honest about the amount of medicines you take and about any drug or alcohol use. Get pain medicine prescriptions from only one health care provider. Pain can be managed with many types of alternative treatments. Ask your health care provider to refer you to one or more specialists who can help you manage pain  through: Physical or occupational therapy. Counseling (cognitive behavioral therapy). Good nutrition. Biofeedback. Massage. Meditation. Non-opioid medicine. Following a gentle exercise program. How to use opioid pain medicine Taking medicine Take your pain medicine exactly as told by your health care provider. Take it only when you need it. If your pain gets less severe, you may take less than your prescribed dose if your health care provider approves. If you are not having pain, do nottake pain medicine unless your health care provider tells you to take it. If your pain is severe, do nottry to treat it yourself by taking more pills than instructed on your prescription. Contact your health care provider for help. Write down the times when you take your pain medicine. It is easy to become confused while on pain medicine. Writing the time can help you avoid overdose. Take other over-the-counter or prescription medicines only as told by your health care provider. Keeping yourself and others safe  While you are taking opioid pain medicine: Do not drive, use machinery, or power tools. Do not sign legal documents. Do not drink alcohol. Do not take sleeping pills. Do not supervise children by yourself. Do not do activities that require climbing or being in high places. Do not go to a lake, river, ocean, spa, or swimming pool. Do not share your pain medicine with anyone. Keep pain medicine in a locked cabinet or in a secure area where pets and children cannot reach it. Stopping your use of opioids If you have been taking opioid medicine for more than a few weeks, you may need to slowly decrease (taper) how much you take until you stop completely. Tapering your use of opioids can decrease your risk of symptoms of withdrawal, such as: Pain and cramping in the abdomen. Nausea. Sweating. Sleepiness. Restlessness. Uncontrollable shaking (tremors). Cravings for the medicine. Do not attempt to  taper your use of opioids on your own. Talk with your health care provider about how to do this. Your health care provider may prescribe a step-down schedule based on how much medicine you are taking and how long you have been taking it. Getting rid of leftover pills Do not save any leftover pills. Get rid of leftover pills safely by: Taking the medicine to a prescription take-back program. This is usually offered by the county or law enforcement. Bringing them to a pharmacy that has a drug disposal container. Flushing them down the toilet. Check the label or package insert of your medicine to see whether this is safe to do. Throwing them out in the trash. Check the label or package insert of your  medicine to see whether this is safe to do. If it is safe to throw it out, remove the medicine from the original container, put it into a sealable bag or container, and mix it with used coffee grounds, food scraps, dirt, or cat litter before putting it in the trash. Follow these instructions at home: Activity Do exercises as told by your health care provider. Avoid activities that make your pain worse. Return to your normal activities as told by your health care provider. Ask your health care provider what activities are safe for you. General instructions You may need to take these actions to prevent or treat constipation: Drink enough fluid to keep your urine pale yellow. Take over-the-counter or prescription medicines. Eat foods that are high in fiber, such as beans, whole grains, and fresh fruits and vegetables. Limit foods that are high in fat and processed sugars, such as fried or sweet foods. Keep all follow-up visits. This is important. Where to find support If you have been taking opioids for a long time, you may benefit from receiving support for quitting from a local support group or counselor. Ask your health care provider for a referral to these resources in your area. Where to find more  information Centers for Disease Control and Prevention (CDC): http://www.wolf.info/ [place] Food and Drug Administration (FDA): [place] Get help right away if: You may have taken too much of an opioid (overdosed). Common symptoms of an overdose: Your breathing is slower or more shallow than normal. You have a very slow heartbeat (pulse). You have slurred speech. You have nausea and vomiting. Your pupils become very small. You have other potential symptoms: You are very confused. You faint or feel like you will faint. You have cold, clammy skin. You have blue lips or fingernails. You have thoughts of harming yourself or harming others. These symptoms may represent a serious problem that is an emergency. Do not wait to see if the symptoms will go away. Get medical help right away. Call your local emergency services (911 in the [place] Do not drive yourself to the hospital.  If you ever feel like you may hurt yourself or others, or have thoughts about taking your own life, get help right away. Go to your nearest emergency department or: Call your local emergency services (911 in the [place] Call the [facility] (573)239-5185 in the [place] Call a suicide crisis helpline, such as the [place] at 431-336-4010 or 988 in the [place]. This is open 24 hours a day in the [place] Text the Crisis Text Line at 747-259-3903 (in the [place].). Summary Opioid medicines can help you manage moderate to severe pain for a short period of time. A pain treatment plan is an agreement between you and your health care provider. Discuss the goals of your treatment, including how much pain you might expect to have and how you will manage the pain. If you think that you or someone else may have taken too much of an opioid, get medical help right away. This information is not intended to replace advice given to you by your health care provider. Make sure you discuss any questions you have with  your health care provider. Document Revised: 10/13/2020 Document Reviewed: 06/30/2020 Elsevier Patient Education  [place] directives: Advance directive discussed with you today. Even though you declined this today, please call our office should you change your mind, and we can give you the proper paperwork for you to fill  out.   Conditions/risks identified: None  Next appointment: Follow up in one year for your annual wellness visit.    Preventive Care 56 Years and Older, Male  Preventive care refers to lifestyle choices and visits with your health care provider that can promote health and wellness. What does preventive care include? A yearly physical exam. This is also called an annual well check. Dental exams once or twice a year. Routine eye exams. Ask your health care provider how often you should have your eyes checked. Personal lifestyle choices, including: Daily care of your teeth and gums. Regular physical activity. Eating a healthy diet. Avoiding tobacco and drug use. Limiting alcohol use. Practicing safe sex. Taking low doses of aspirin every day. Taking vitamin and mineral supplements as recommended by your health care provider. What happens during an annual well check? The services and screenings done by your health care provider during your annual well check will depend on your age, overall health, lifestyle risk factors, and family history of disease. Counseling  Your health care provider may ask you questions about your: Alcohol use. Tobacco use. Drug use. Emotional well-being. Home and relationship well-being. Sexual activity. Eating habits. History of falls. Memory and ability to understand (cognition). Work and work Statistician. Screening  You may have the following tests or measurements: Height, weight, and BMI. Blood pressure. Lipid and cholesterol levels. These may be checked every 5 years, or more frequently if you are over 56  years old. Skin check. Lung cancer screening. You may have this screening every year starting at age 56 if you have a 30-pack-year history of smoking and currently smoke or have quit within the past 15 years. Fecal occult blood test (FOBT) of the stool. You may have this test every year starting at age 31. Flexible sigmoidoscopy or colonoscopy. You may have a sigmoidoscopy every 5 years or a colonoscopy every 10 years starting at age 30. Prostate cancer screening. Recommendations will vary depending on your family history and other risks. Hepatitis C blood test. Hepatitis B blood test. Sexually transmitted disease (STD) testing. Diabetes screening. This is done by checking your blood sugar (glucose) after you have not eaten for a while (fasting). You may have this done every 1-3 years. Abdominal aortic aneurysm (AAA) screening. You may need this if you are a current or former smoker. Osteoporosis. You may be screened starting at age 61 if you are at high risk. Talk with your health care provider about your test results, treatment options, and if necessary, the need for more tests. Vaccines  Your health care provider may recommend certain vaccines, such as: Influenza vaccine. This is recommended every year. Tetanus, diphtheria, and acellular pertussis (Tdap, Td) vaccine. You may need a Td booster every 10 years. Zoster vaccine. You may need this after age 68. Pneumococcal 13-valent conjugate (PCV13) vaccine. One dose is recommended after age 83. Pneumococcal polysaccharide (PPSV23) vaccine. One dose is recommended after age 44. Talk to your health care provider about which screenings and vaccines you need and how often you need them. This information is not intended to replace advice given to you by your health care provider. Make sure you discuss any questions you have with your health care provider. Document Released: 04/16/2015 Document Revised: 12/08/2015 Document Reviewed:  01/19/2015 Elsevier Interactive Patient Education  2017 [place] Prevention in the Home Falls can cause injuries. They can happen to people of all ages. There are many things you can do to make your home safe  and to help prevent falls. What can I do on the outside of my home? Regularly fix the edges of walkways and driveways and fix any cracks. Remove anything that might make you trip as you walk through a door, such as a raised step or threshold. Trim any bushes or trees on the path to your home. Use bright outdoor lighting. Clear any walking paths of anything that might make someone trip, such as rocks or tools. Regularly check to see if handrails are loose or broken. Make sure that both sides of any steps have handrails. Any raised decks and porches should have guardrails on the edges. Have any leaves, snow, or ice cleared regularly. Use sand or salt on walking paths during winter. Clean up any spills in your garage right away. This includes oil or grease spills. What can I do in the bathroom? Use night lights. Install grab bars by the toilet and in the tub and shower. Do not use towel bars as grab bars. Use non-skid mats or decals in the tub or shower. If you need to sit down in the shower, use a plastic, non-slip stool. Keep the floor dry. Clean up any water that spills on the floor as soon as it happens. Remove soap buildup in the tub or shower regularly. Attach bath mats securely with double-sided non-slip rug tape. Do not have throw rugs and other things on the floor that can make you trip. What can I do in the bedroom? Use night lights. Make sure that you have a light by your bed that is easy to reach. Do not use any sheets or blankets that are too big for your bed. They should not hang down onto the floor. Have a firm chair that has side arms. You can use this for support while you get dressed. Do not have throw rugs and other things on the floor that can make you  trip. What can I do in the kitchen? Clean up any spills right away. Avoid walking on wet floors. Keep items that you use a lot in easy-to-reach places. If you need to reach something above you, use a strong step stool that has a grab bar. Keep electrical cords out of the way. Do not use floor polish or wax that makes floors slippery. If you must use wax, use non-skid floor wax. Do not have throw rugs and other things on the floor that can make you trip. What can I do with my stairs? Do not leave any items on the stairs. Make sure that there are handrails on both sides of the stairs and use them. Fix handrails that are broken or loose. Make sure that handrails are as long as the stairways. Check any carpeting to make sure that it is firmly attached to the stairs. Fix any carpet that is loose or worn. Avoid having throw rugs at the top or bottom of the stairs. If you do have throw rugs, attach them to the floor with carpet tape. Make sure that you have a light switch at the top of the stairs and the bottom of the stairs. If you do not have them, ask someone to add them for you. What else can I do to help prevent falls? Wear shoes that: Do not have high heels. Have rubber bottoms. Are comfortable and fit you well. Are closed at the toe. Do not wear sandals. If you use a stepladder: Make sure that it is fully opened. Do not climb a closed stepladder. Make  sure that both sides of the stepladder are locked into place. Ask someone to hold it for you, if possible. Clearly mark and make sure that you can see: Any grab bars or handrails. First and last steps. Where the edge of each step is. Use tools that help you move around (mobility aids) if they are needed. These include: Canes. Walkers. Scooters. Crutches. Turn on the lights when you go into a dark area. Replace any light bulbs as soon as they burn out. Set up your furniture so you have a clear path. Avoid moving your furniture  around. If any of your floors are uneven, fix them. If there are any pets around you, be aware of where they are. Review your medicines with your doctor. Some medicines can make you feel dizzy. This can increase your chance of falling. Ask your doctor what other things that you can do to help prevent falls. This information is not intended to replace advice given to you by your health care provider. Make sure you discuss any questions you have with your health care provider. Document Released: 01/14/2009 Document Revised: 08/26/2015 Document Reviewed: 04/24/2014 Elsevier Interactive Patient Education  2017 [employer].

## 2022-04-25 NOTE — Progress Notes (Signed)
[name], Ryott A ([id number]) [id number].pdf Page 1 of 12 Visit Report for 04/25/2022 Arrival Information Details Patient Name: Date of Service: [place] 04/25/2022 12:30 PM Medical Record Number: [id number] Patient Account Number: [id number] Date of Birth/Sex: Treating RN: October 11, 1945 (77 y.o. [name] Primary Care Dalton Mille: [name] Other Clinician: Referring Lukka Black: Treating Ravin Bendall/Extender: [name] in Treatment: 10 Visit Information History Since Last Visit Added or deleted any medications: No Patient Arrived: Other Any new allergies or adverse reactions: No Arrival Time: 12:35 Had a fall or experienced change in No Accompanied By: self activities of daily living that may affect Transfer Assistance: None risk of falls: Patient Identification Verified: Yes Signs or symptoms of abuse/neglect since last visito No Secondary Verification Process Completed: Yes Hospitalized since last visit: No Patient Requires Transmission-Based Precautions: No Implantable device outside of the clinic excluding No Patient Has Alerts: Yes cellular tissue based products placed in the center Patient Alerts: Patient on Blood Thinner since last visit: Has Dressing in Place as Prescribed: Yes Has Compression in Place as Prescribed: Yes Pain Present Now: No Electronic Signature(s) Signed: 04/25/2022 3:23:10 PM By: Adline Peals Entered By: Adline Peals on 04/25/2022 12:38:25 -------------------------------------------------------------------------------- Compression Therapy Details Patient Name: Date of Service: [name], Toran A. 04/25/2022 12:30 PM Medical Record Number: [id number] Patient Account Number: [id number] Date of Birth/Sex: Treating RN: [birth year] (77 y.o. [name] Primary Care Cristianna Cyr: [name] Other Clinician: Referring Kevan Prouty: Treating Giulliana Mcroberts/Extender: [name] in Treatment: 10 Compression Therapy Performed for Wound Assessment: Wound #4 Left,Anterior Lower Leg Performed By: Clinician Adline Peals, RN Compression Type: Rolena Infante Post Procedure Diagnosis Same as Pre-procedure Electronic Signature(s) Signed: 04/25/2022 3:23:10 PM By: Adline Peals Entered By: Adline Peals on 04/25/2022 15:22:05 [place], Yousef A ([id number]) [id number] Page 2 of 12 -------------------------------------------------------------------------------- Encounter Discharge Information Details Patient Name: Date of Service: KYHEEM, BATHGATE A. 04/25/2022 12:30 PM Medical Record Number: [id number] Patient Account Number: [id number] Date of Birth/Sex: Treating RN: 1945/12/22 (77 y.o. [name] Primary Care Greysyn Vanderberg: [name] Other Clinician: Referring Ingeborg Fite: Treating Anis Degidio/Extender: [name] in Treatment: 10 Encounter Discharge Information Items Post Procedure Vitals Discharge Condition: Stable Temperature (F): 98 Ambulatory Status: Other Pulse (bpm): 58 Discharge Destination: Home Respiratory Rate (breaths/min): 18 Transportation: Private Auto Blood Pressure (mmHg): 130/70 Accompanied By: wife Schedule Follow-up Appointment: Yes Clinical Summary of Care: Patient Declined Electronic Signature(s) Signed: 04/25/2022 3:23:10 PM By: Adline Peals Entered By: Adline Peals on 04/25/2022 14:32:49 -------------------------------------------------------------------------------- Lower Extremity Assessment Details Patient Name: Date of Service: [name], Tyrese A. 04/25/2022 12:30 PM Medical Record Number: [id number] Patient Account Number: [id number] Date of Birth/Sex: Treating RN: 03-Dec-1945 (77 y.o. [name] Primary Care Soma Lizak: [name] Other Clinician: Referring Brit Carbonell: Treating Quadir Muns/Extender: [name] in Treatment: 10 Edema Assessment Assessed: [name]: No] [name]: No] [Left: Edema] [Right: :] Calf Left: Right: Point of Measurement: From Medial Instep 50.5 cm 55 cm Ankle Left: Right: Point of Measurement: From Medial Instep 30.5 cm 30.5 cm Vascular Assessment Pulses: Dorsalis Pedis Palpable: [Left:Yes] [Right:Yes] Electronic Signature(s) Signed: 04/25/2022 3:23:10 PM By: Adline Peals Entered By: Adline Peals on 04/25/2022 12:52:03 Multi Wound Chart Details -------------------------------------------------------------------------------- Vicente Masson A ([id number]) [id number] Page 3 of 12 Patient Name: Date of Service: ELMUS, MATHES A. 04/25/2022 12:30 PM Medical Record Number: [id number] Patient Account Number: [id number] Date of Birth/Sex: Treating RN: [birth year] (77 y.o. M) Primary Care Bonnie Roig: [name]  Other Clinician: Referring Kinnie Kaupp: Treating Jalynn Waddell/Extender: [name] in Treatment: 10 Vital Signs Height(in): 72 Pulse(bpm): 74 Weight(lbs): 375 Blood Pressure(mmHg): 130/70 Body Mass Index(BMI): 50.9 Temperature(F): 98 Respiratory Rate(breaths/min): 18 Wound Assessments Wound Number: '4 5 6 '$ Photos: Left, Anterior Lower Leg Right, Anterior Lower Leg Right, Proximal, Anterior Lower Leg Wound Location: Blister Gradually Appeared Blister Wounding Event: Lymphedema Lymphedema Lymphedema Primary Etiology: Anemia, Lymphedema, Sleep Apnea, Anemia, Lymphedema, Sleep Apnea, Anemia, Lymphedema, Sleep Apnea, Comorbid History: Arrhythmia, Congestive Heart Failure, Arrhythmia, Congestive Heart Failure, Arrhythmia, Congestive Heart Failure, Hypertension, Type II Diabetes, Hypertension, Type II Diabetes, Hypertension, Type II Diabetes, Neuropathy Neuropathy Neuropathy 02/13/2021 03/30/2022 04/10/2022 Date Acquired: '10 3 2 '$ Weeks of Treatment: Open Healed -  Epithelialized Healed - Epithelialized Wound Status: No No No Wound Recurrence: No No Yes Clustered Wound: N/A N/A 3 Clustered Quantity: 0.1x0.1x0.1 0x0x0 0x0x0 Measurements L x W x D (cm) 0.008 0 0 A (cm) : rea 0.001 0 0 Volume (cm) : 99.90% 100.00% 100.00% % Reduction in A rea: 99.90% 100.00% 100.00% % Reduction in Volume: Full Thickness Without Exposed Full Thickness Without Exposed Full Thickness Without Exposed Classification: Support Structures Support Structures Support Structures None Present None Present None Present Exudate A mount: N/A N/A N/A Exudate Type: N/A N/A N/A Exudate Color: Distinct, outline attached Distinct, outline attached Distinct, outline attached Wound Margin: None Present (0%) None Present (0%) None Present (0%) Granulation A mount: N/A N/A N/A Granulation Quality: Large (67-100%) None Present (0%) None Present (0%) Necrotic A mount: Eschar N/A N/A Necrotic Tissue: Fascia: No Fascia: No Fascia: No Exposed Structures: Fat Layer (Subcutaneous Tissue): No Fat Layer (Subcutaneous Tissue): No Fat Layer (Subcutaneous Tissue): No Tendon: No Tendon: No Tendon: No Muscle: No Muscle: No Muscle: No Joint: No Joint: No Joint: No Bone: No Bone: No Bone: No Large (67-100%) Large (67-100%) Large (67-100%) Epithelialization: Debridement - Selective/Open Wound N/A N/A Debridement: Pre-procedure Verification/Time Out 13:03 N/A N/A Taken: Necrotic/Eschar N/A N/A Tissue Debrided: Non-Viable Tissue N/A N/A Level: 0.01 N/A N/A Debridement A (sq cm): rea Curette N/A N/A Instrument: Minimum N/A N/A Bleeding: Pressure N/A N/A Hemostasis A chieved: Procedure was tolerated well N/A N/A Debridement Treatment Response: 0.1x0.1x0.1 N/A N/A Post Debridement Measurements L x W x D (cm) 0.001 N/A N/A Post Debridement Volume: (cm) No Abnormalities Noted No Abnormalities Noted No Abnormalities Noted Periwound Skin Texture: Dry/Scaly:  No No Abnormalities Noted No Abnormalities Noted Periwound Skin Moisture: Erythema: Yes Hemosiderin Staining: Yes Erythema: Yes Periwound Skin Color: Hemosiderin Staining: Yes No Abnormality No Abnormality No Abnormality Temperature: Debridement N/A N/A Procedures Performed: Wound Number: 7 N/A N/A PhotosMoo, Gravley Saiquan A ([id number]) [id number] Page 4 of 12 Photos: No Photos N/A N/A Left T Great oe N/A N/A Wound Location: Trauma N/A N/A Wounding Event: Diabetic Wound/Ulcer of the Lower N/A N/A Primary Etiology: Extremity Anemia, Lymphedema, Sleep Apnea, N/A N/A Comorbid History: Arrhythmia, Congestive Heart Failure, Hypertension, Type II Diabetes, Neuropathy 04/25/2022 N/A N/A Date A cquired: 0 N/A N/A Weeks of Treatment: Open N/A N/A Wound Status: No N/A N/A Wound Recurrence: No N/A N/A Clustered Wound: N/A N/A N/A Clustered Quantity: 0.3x0.3x0.1 N/A N/A Measurements L x W x D (cm) 0.071 N/A N/A A (cm) : rea 0.007 N/A N/A Volume (cm) : N/A N/A N/A % Reduction in A rea: N/A N/A N/A % Reduction in Volume: Grade 1 N/A N/A Classification: Medium N/A N/A Exudate A mount: Serosanguineous N/A N/A Exudate Type: red, brown N/A N/A Exudate Color: Distinct, outline attached N/A N/A Wound  Margin: Large (67-100%) N/A N/A Granulation A mount: Red N/A N/A Granulation Quality: None Present (0%) N/A N/A Necrotic A mount: N/A N/A N/A Necrotic Tissue: Fat Layer (Subcutaneous Tissue): Yes N/A N/A Exposed Structures: Fascia: No Tendon: No Muscle: No Joint: No Bone: No Small (1-33%) N/A N/A Epithelialization: N/A N/A N/A Debridement: N/A N/A N/A Tissue Debrided: N/A N/A N/A Level: N/A N/A N/A Debridement A (sq cm): rea N/A N/A N/A Instrument: N/A N/A N/A Bleeding: N/A N/A N/A Hemostasis A chieved: Debridement Treatment Response: N/A N/A N/A Post Debridement Measurements L x N/A N/A N/A W x D (cm) N/A N/A  N/A Post Debridement Volume: (cm) Callus: Yes N/A N/A Periwound Skin Texture: No Abnormalities Noted N/A N/A Periwound Skin Moisture: No Abnormalities Noted N/A N/A Periwound Skin Color: No Abnormality N/A N/A Temperature: N/A N/A N/A Procedures Performed: Treatment Notes Electronic Signature(s) Signed: 04/25/2022 1:16:49 PM By: [name] MD FACS Entered By: [name] on 04/25/2022 13:16:49 -------------------------------------------------------------------------------- Multi-Disciplinary Care Plan Details Patient Name: Date of Service: [name], Jayland A. 04/25/2022 12:30 PM Medical Record Number: [id number] Patient Account Number: [id number] Date of Birth/Sex: Treating RN: September 07, 1945 (77 y.o. [name] Primary Care Ihor Meinzer: [name] Other Clinician: Referring Akeisha Lagerquist: Treating Qais Jowers/Extender: [name] in Treatment: [address] Inactive Borak, Arn A ([id number]) [id number] Page 5 of 12 Necrotic Tissue Nursing Diagnoses: Impaired tissue integrity related to necrotic/devitalized tissue Knowledge deficit related to management of necrotic/devitalized tissue Goals: Necrotic/devitalized tissue will be minimized in the wound bed Date Initiated: 02/13/2022 Target Resolution Date: 06/02/2022 Goal Status: Active Patient/caregiver will verbalize understanding of reason and process for debridement of necrotic tissue Date Initiated: 02/13/2022 Target Resolution Date: 06/02/2022 Goal Status: Active Interventions: Assess patient pain level pre-, during and post procedure and prior to discharge Provide education on necrotic tissue and debridement process Treatment Activities: Apply topical anesthetic as ordered : 02/13/2022 Notes: Wound/Skin Impairment Nursing Diagnoses: Impaired tissue integrity Knowledge deficit related to ulceration/compromised skin integrity Goals: Patient/caregiver  will verbalize understanding of skin care regimen Date Initiated: 02/13/2022 Target Resolution Date: 06/30/2022 Goal Status: Active Interventions: Assess ulceration(s) every visit Treatment Activities: Skin care regimen initiated : 02/13/2022 Topical wound management initiated : 02/13/2022 Notes: Electronic Signature(s) Signed: 04/25/2022 3:23:10 PM By: Adline Peals Entered By: Adline Peals on 04/25/2022 12:29:54 -------------------------------------------------------------------------------- Pain Assessment Details Patient Name: Date of Service: [name], Ramces A. 04/25/2022 12:30 PM Medical Record Number: [id number] Patient Account Number: [id number] Date of Birth/Sex: Treating RN: 1945/11/10 (77 y.o. [name] Primary Care Kadarious Dikes: [name] Other Clinician: Referring Lawanna Cecere: Treating Valerya Maxton/Extender: [name] in Treatment: 10 Active Problems Location of Pain Severity and Description of Pain Patient Has Paino No Site Locations Rate the pain. Vanbuskirk, Keyandre A ([id number]) [id number].pdf Page 6 of 12 Rate the pain. Current Pain Level: 0 Pain Management and Medication Current Pain Management: Electronic Signature(s) Signed: 04/25/2022 3:23:10 PM By: Adline Peals Entered By: Adline Peals on 04/25/2022 12:38:33 -------------------------------------------------------------------------------- Patient/Caregiver Education Details Patient Name: Date of Service: [name], Sherwood A. 1/23/2024andnbsp12:30 PM Medical Record Number: [id number] Patient Account Number: [id number] Date of Birth/Gender: Treating RN: Mar 01, 1946 (77 y.o. [name] Primary Care Physician: [name] Other Clinician: Referring Physician: Treating Physician/Extender: [name] in Treatment: 10 Education Assessment Education Provided To: Patient Education Topics  Provided Wound/Skin Impairment: Methods: Explain/Verbal Responses: Reinforcements needed, State content correctly Electronic Signature(s) Signed: 04/25/2022 3:23:10 PM By: Adline Peals Entered By: Adline Peals on 04/25/2022 12:30:07 -------------------------------------------------------------------------------- Wound  Assessment Details Patient Name: Date of Service: CERVANDO, DURNIN A. 04/25/2022 12:30 PM Medical Record Number: [id number] Patient Account Number: [id number] Date of Birth/Sex: Treating RN: 1945-09-24 (77 y.o. [name] Primary Care Paradise Vensel: [name] Other Clinician: Referring Nirvi Boehler: Treating Sigmond Patalano/Extender: Eyvonne Mechanic, Letona ([id number]) [phone].pdf Page 7 of 12 Weeks in Treatment: 10 Wound Status Wound Number: 4 Primary Lymphedema Etiology: Wound Location: Left, Anterior Lower Leg Wound Open Wounding Event: Blister Status: Date Acquired: 02/13/2021 Comorbid Anemia, Lymphedema, Sleep Apnea, Arrhythmia, Congestive Heart Weeks Of Treatment: 10 History: Failure, Hypertension, Type II Diabetes, Neuropathy Clustered Wound: No Photos Wound Measurements Length: (cm) 0.1 Width: (cm) 0.1 Depth: (cm) 0.1 Area: (cm) 0.008 Volume: (cm) 0.001 % Reduction in Area: 99.9% % Reduction in Volume: 99.9% Epithelialization: Large (67-100%) Tunneling: No Undermining: No Wound Description Classification: Full Thickness Without Exposed Support Structures Wound Margin: Distinct, outline attached Exudate Amount: None Present Foul Odor After Cleansing: No Slough/Fibrino No Wound Bed Granulation Amount: None Present (0%) Exposed Structure Necrotic Amount: Large (67-100%) Fascia Exposed: No Necrotic Quality: Eschar Fat Layer (Subcutaneous Tissue) Exposed: No Tendon Exposed: No Muscle Exposed: No Joint Exposed: No Bone Exposed: No Periwound Skin Texture Texture Color No  Abnormalities Noted: Yes No Abnormalities Noted: No Erythema: Yes Moisture Hemosiderin Staining: Yes No Abnormalities Noted: No Dry / Scaly: No Temperature / Pain Temperature: No Abnormality Treatment Notes Wound #4 (Lower Leg) Wound Laterality: Left, Anterior Cleanser Soap and Water Discharge Instruction: May shower and wash wound with dial antibacterial soap and water prior to dressing change. Wound Cleanser Discharge Instruction: Cleanse the wound with wound cleanser prior to applying a clean dressing using gauze sponges, not tissue or cotton balls. Peri-Wound Care Sween Lotion (Moisturizing lotion) Discharge Instruction: Apply moisturizing lotion as directed Topical Primary Dressing Sorbalgon AG Dressing, 4x4 (in/in) Discharge Instruction: Apply to wound bed as instructed Frechette, Chan A ([id number]) [phone].pdf Page 8 of 12 Secondary Dressing Woven Gauze Sponge, Non-Sterile 4x4 in Discharge Instruction: Apply over primary dressing as directed. Zetuvit Plus 4x8 in Discharge Instruction: Apply over primary dressing as directed. Secured With Transpore Surgical Tape, 2x10 (in/yd) Discharge Instruction: Secure dressing with tape as directed. Compression Wrap Unnaboot w/Calamine, 4x10 (in/yd) Discharge Instruction: Apply Unnaboot as directed. Kerlix Roll 4.5x3.1 (in/yd) Discharge Instruction: Apply Kerlix and Coban compression as directed. Coban Self-Adherent Wrap 4x5 (in/yd) Discharge Instruction: Apply over Kerlix as directed. Compression Stockings Jobst Farrow Wrap 4000 Quantity: 1 Left Leg Compression Amount: 30-40 mmHg Discharge Instruction: Apply Francia Greaves daily as instructed. Apply first thing in the morning, remove at night before bed. Add-Ons Electronic Signature(s) Signed: 04/25/2022 3:23:10 PM By: Adline Peals Entered By: Adline Peals on 04/25/2022  12:54:20 -------------------------------------------------------------------------------- Wound Assessment Details Patient Name: Date of Service: [name], Ariez A. 04/25/2022 12:30 PM Medical Record Number: [id number] Patient Account Number: [id number] Date of Birth/Sex: Treating RN: 12/10/1945 (77 y.o. [name] Primary Care Tamela Elsayed: [name] Other Clinician: Referring Alece Koppel: Treating Kaulana Brindle/Extender: [name] in Treatment: 10 Wound Status Wound Number: 5 Primary Lymphedema Etiology: Wound Location: Right, Anterior Lower Leg Wound Healed - Epithelialized Wounding Event: Gradually Appeared Status: Date Acquired: 03/30/2022 Comorbid Anemia, Lymphedema, Sleep Apnea, Arrhythmia, Congestive Heart Weeks Of Treatment: 3 History: Failure, Hypertension, Type II Diabetes, Neuropathy Clustered Wound: No Photos Wound Measurements Length: (cm) 0 Width: (cm) 0 Depth: (cm) 0 Mcgowen, Cristofer A ([id number]) Area: (cm) 0 Volume: (cm) 0 % Reduction in Area: 100% % Reduction in Volume: 100% Epithelialization: Large (67-100%)  [phone].pdf Page 9 of 12 Tunneling: No Undermining: No Wound Description Classification: Full Thickness Without Exposed Sup Wound Margin: Distinct, outline attached Exudate Amount: None Present port Structures Foul Odor After Cleansing: No Slough/Fibrino No Wound Bed Granulation Amount: None Present (0%) Exposed Structure Necrotic Amount: None Present (0%) Fascia Exposed: No Fat Layer (Subcutaneous Tissue) Exposed: No Tendon Exposed: No Muscle Exposed: No Joint Exposed: No Bone Exposed: No Periwound Skin Texture Texture Color No Abnormalities Noted: Yes No Abnormalities Noted: No Hemosiderin Staining: Yes Moisture No Abnormalities Noted: Yes Temperature / Pain Temperature: No Abnormality Electronic Signature(s) Signed: 04/25/2022 3:23:10 PM By: Adline Peals Entered  By: Adline Peals on 04/25/2022 13:04:29 -------------------------------------------------------------------------------- Wound Assessment Details Patient Name: Date of Service: [name], Terell A. 04/25/2022 12:30 PM Medical Record Number: [id number] Patient Account Number: [id number] Date of Birth/Sex: Treating RN: 1945/07/27 (77 y.o. [name] Primary Care Romell Wolden: [name] Other Clinician: Referring Kamaile Zachow: Treating Elaya Droege/Extender: [name] in Treatment: 10 Wound Status Wound Number: 6 Primary Lymphedema Etiology: Wound Location: Right, Proximal, Anterior Lower Leg Wound Healed - Epithelialized Wounding Event: Blister Status: Date Acquired: 04/10/2022 Comorbid Anemia, Lymphedema, Sleep Apnea, Arrhythmia, Congestive Heart Weeks Of Treatment: 2 History: Failure, Hypertension, Type II Diabetes, Neuropathy Clustered Wound: Yes Photos Wound Measurements Length: (cm) Width: (cm) Depth: (cm) Clustered Quantity: Area: (cm) Reach, Luther A ([id number]) Volume: (cm) 0 0 % Reduction in Area: 100% 0 % Reduction in Volume: 100% 0 Epithelialization: Large (67-100%) 3 Tunneling: No 0 Undermining: No [phone].pdf Page 10 of 12 Wound Description Classification: Full Thickness Without Exposed Support Structures Wound Margin: Distinct, outline attached Exudate Amount: None Present Foul Odor After Cleansing: No Slough/Fibrino No Wound Bed Granulation Amount: None Present (0%) Exposed Structure Necrotic Amount: None Present (0%) Fascia Exposed: No Fat Layer (Subcutaneous Tissue) Exposed: No Tendon Exposed: No Muscle Exposed: No Joint Exposed: No Bone Exposed: No Periwound Skin Texture Texture Color No Abnormalities Noted: Yes No Abnormalities Noted: No Erythema: Yes Moisture No Abnormalities Noted: Yes Temperature / Pain Temperature: No Abnormality Electronic Signature(s) Signed: 04/25/2022  3:23:10 PM By: Adline Peals Entered By: Adline Peals on 04/25/2022 13:04:41 -------------------------------------------------------------------------------- Wound Assessment Details Patient Name: Date of Service: [name], Gurtej A. 04/25/2022 12:30 PM Medical Record Number: [id number] Patient Account Number: [id number] Date of Birth/Sex: Treating RN: Jul 12, 1945 (77 y.o. [name] Primary Care Hatley Henegar: [name] Other Clinician: Referring Tashaya Ancrum: Treating Koi Yarbro/Extender: [name] in Treatment: 10 Wound Status Wound Number: 7 Primary Diabetic Wound/Ulcer of the Lower Extremity Etiology: Wound Location: Left T Great oe Wound Open Wounding Event: Trauma Status: Date Acquired: 04/25/2022 Comorbid Anemia, Lymphedema, Sleep Apnea, Arrhythmia, Congestive Heart Weeks Of Treatment: 0 History: Failure, Hypertension, Type II Diabetes, Neuropathy Clustered Wound: No Wound Measurements Length: (cm) 0.3 Width: (cm) 0.3 Depth: (cm) 0.1 Area: (cm) 0.071 Volume: (cm) 0.007 % Reduction in Area: % Reduction in Volume: Epithelialization: Small (1-33%) Tunneling: No Undermining: No Wound Description Classification: Grade 1 Wound Margin: Distinct, outline attached Exudate Amount: Medium Exudate Type: Serosanguineous Exudate Color: red, brown Foul Odor After Cleansing: No Slough/Fibrino No Wound Bed Granulation Amount: Large (67-100%) Exposed Structure Granulation Quality: Red Fascia Exposed: No Necrotic Amount: None Present (0%) Fat Layer (Subcutaneous Tissue) Exposed: Yes Tendon Exposed: No Muscle Exposed: No Chacko, Carmel A ([id number]) [id number] Page 11 of 12 Joint Exposed: No Bone Exposed: No Periwound Skin Texture Texture Color No Abnormalities Noted: No No Abnormalities Noted: Yes Callus: Yes Temperature / Pain Temperature: No Abnormality Moisture No  Abnormalities Noted:  Yes Treatment Notes Wound #7 (Toe Great) Wound Laterality: Left Cleanser Soap and Water Discharge Instruction: May shower and wash wound with dial antibacterial soap and water prior to dressing change. Wound Cleanser Discharge Instruction: Cleanse the wound with wound cleanser prior to applying a clean dressing using gauze sponges, not tissue or cotton balls. Peri-Wound Care Topical Primary Dressing Sorbalgon AG Dressing 2x2 (in/in) Discharge Instruction: Apply to wound bed as instructed Secondary Dressing Woven Gauze Sponges 2x2 in Discharge Instruction: Apply over primary dressing as directed. Secured With Conforming Stretch Gauze Bandage Roll, Sterile 4x75 (in/in) Discharge Instruction: Secure with stretch gauze as directed. Compression Wrap Compression Stockings Add-Ons Electronic Signature(s) Signed: 04/25/2022 3:23:10 PM By: Adline Peals Entered By: Adline Peals on 04/25/2022 13:05:51 -------------------------------------------------------------------------------- Vitals Details Patient Name: Date of Service: [place], Zephaniah A. 04/25/2022 12:30 PM Medical Record Number: [id number] Patient Account Number: [id number] Date of Birth/Sex: Treating RN: 03/17/1946 (77 y.o. [name] Primary Care Dan Dissinger: [name] Other Clinician: Referring Siniyah Evangelist: Treating Ronisha Herringshaw/Extender: [name] in Treatment: 10 Vital Signs Time Taken: 12:38 Temperature (F): 98 Height (in): 72 Pulse (bpm): 58 Weight (lbs): 375 Respiratory Rate (breaths/min): 18 Body Mass Index (BMI): 50.9 Blood Pressure (mmHg): 130/70 Reference Range: 80 - 120 mg / dl Electronic Signature(s) Signed: 04/25/2022 3:23:10 PM By: Adline Peals Entered By: Adline Peals on 04/25/2022 12:40:49 Lupe, Pascal A ([id number]) [id number] Page 12 of 12

## 2022-04-25 NOTE — Progress Notes (Signed)
Subjective:   [name] is a 77 y.o. male who presents for Medicare Annual/Subsequent preventive examination.  Review of Systems    Virtual Visit via Telephone Note  I connected with  [name] on 04/25/22 at  1:45 PM EST by telephone and verified that I am speaking with the correct person using two identifiers.  Location: Patient: Home Provider: Office Persons participating in the virtual visit: patient/Nurse Health Advisor   I discussed the limitations, risks, security and privacy concerns of performing an evaluation and management service by telephone and the availability of in person appointments. The patient expressed understanding and agreed to proceed.  Interactive audio and video telecommunications were attempted between this nurse and patient, however failed, due to patient having technical difficulties OR patient did not have access to video capability.  We continued and completed visit with audio only.  Some vital signs may be absent or patient reported.   Criselda Peaches, LPN  Cardiac Risk Factors include: advanced age (>60mn, >>57women);diabetes mellitus;hypertension;male gender;Other (see comment), Risk factor comments: Dx CHF     Objective:    Today's Vitals   04/25/22 1348  Weight: (!) 370 lb (167.8 kg)  Height: 6' (1.829 m)   Body mass index is 50.18 kg/m.     04/25/2022    2:03 PM 05/12/2021    8:22 AM 12/16/2020    3:49 PM 11/02/2020    7:18 PM 10/27/2019    3:13 PM 05/04/2019    8:30 PM 10/13/2018    7:49 PM  Advanced Directives  Does Patient Have a Medical Advance Directive? No No No No No No No  Would patient like information on creating a medical advance directive? No - Patient declined No - Patient declined No - Patient declined No - Patient declined Yes (MAU/Ambulatory/Procedural Areas - Information given) No - Patient declined     Current Medications (verified) Outpatient Encounter Medications as of 04/25/2022  Medication Sig    rOPINIRole (REQUIP) 0.5 MG tablet TAKE 1 TABLET(0.5 MG) BY MOUTH THREE TIMES DAILY   apixaban (ELIQUIS) 5 MG TABS tablet TAKE 1 TABLET(5 MG) BY MOUTH TWICE DAILY   atorvastatin (LIPITOR) 80 MG tablet TAKE 1 TABLET(80 MG) BY MOUTH DAILY   carboxymethylcellulose (REFRESH PLUS) 0.5 % SOLN Place 1 drop into both eyes 3 (three) times daily as needed (dry eyes).   [employer] MELATONIN GUMMIES PO Take 5 mg by mouth at bedtime.   Docusate Sodium (DSS) 100 MG CAPS Take 100 mg by mouth every other day.   empagliflozin (JARDIANCE) 10 MG TABS tablet Take 1 tablet (10 mg total) by mouth daily before breakfast.   finasteride (PROSCAR) 5 MG tablet Take 1 tablet (5 mg total) by mouth daily.   flecainide (TAMBOCOR) 50 MG tablet TAKE 1 TABLET(50 MG) BY MOUTH EVERY 12 HOURS   loratadine (CLARITIN) 10 MG tablet Take 10 mg by mouth daily.   LORazepam (ATIVAN) 0.5 MG tablet Take 1 tablet (0.5 mg total) by mouth at bedtime.   metoprolol succinate (TOPROL-XL) 25 MG 24 hr tablet TAKE 1 TABLET(25 MG) BY MOUTH DAILY   montelukast (SINGULAIR) 10 MG tablet TAKE 1 TABLET(10 MG) BY MOUTH AT BEDTIME   Multiple Vitamin (MULTIVITAMIN WITH MINERALS) TABS tablet Take 1 tablet by mouth daily.   pantoprazole (PROTONIX) 40 MG tablet TAKE 1 TABLET(40 MG) BY MOUTH DAILY   potassium chloride SA (KLOR-CON M) 20 MEQ tablet Take 1 tablet (20 mEq total) by mouth 2 (two) times daily.  sertraline (ZOLOFT) 100 MG tablet Take 1.5 tablets (150 mg total) by mouth at bedtime.   torsemide (DEMADEX) 20 MG tablet TAKE 3 TABLETS BY MOUTH EVERY DAY   traMADol (ULTRAM) 50 MG tablet TAKE 1 TABLET BY MOUTH EVERY 8 HOURS AS NEEDED   zinc gluconate 50 MG tablet Take 1 tablet (50 mg total) by mouth daily.   No facility-administered encounter medications on file as of 04/25/2022.    Allergies (verified) Morphine, Penicillins, Codeine, and Propoxyphene n-acetaminophen   History: Past Medical History:  Diagnosis Date   Anemia 07/29/2013   Asthma    childhood  asthma   Benign essential HTN    Benign prostatic hypertrophy    several prostate biopsies neg for cancer.    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   Past Surgical History:  Procedure Laterality Date   ARTHROSCOPY KNEE W/ DRILLING     BREATH TEK H PYLORI N/A 12/23/2012   Procedure: BREATH TEK H PYLORI;  Surgeon: Gayland Curry, MD;  Location: [facility];  Service: General;  Laterality: N/A;   [place] N/A 07/31/2013   Procedure: COLONOSCOPY;  Surgeon: Gatha Mayer, MD;  Location: [place];  Service: Endoscopy;  Laterality: N/A;   ESOPHAGOGASTRODUODENOSCOPY N/A 07/22/2013   Procedure: ESOPHAGOGASTRODUODENOSCOPY (EGD);  Surgeon: Irene Shipper, MD;  [facility] END[facility]COPY;  Service: Endoscopy;  Laterality: N/A;   ESOPHAGOGASTRODUODENOSCOPY N/A 07/31/2013   Procedure: ESOPHAGOGASTRODUODENOSCOPY (EGD);  Surgeon: Gatha Mayer, MD;  Location: [facility] ENDOSCOPY;  Service: Endoscopy;  Laterality: N/A;   INTRAOPERATIVE TRANSESOPHAGEAL ECHOCARDIOGRAM N/A 07/18/2013   Procedure: INTRAOPERATIVE TRANSESOPHAGEAL ECHOCARDIOGRAM;  Surgeon: Grace Isaac, MD;  Location: [place];  Service: Open Heart Surgery;  Laterality: N/A;   LAPAROSCOPIC GASTRIC BANDING WITH HIATAL HERNIA REPAIR N/A  04/08/2013   Procedure: LAPAROSCOPIC GASTRIC BANDING WITH possible  HIATAL HERNIA REPAIR;  Surgeon: Gayland Curry, MD;  Location: [facility];  Service: General;  Laterality: N/A;   LITHOTRIPSY     renal calculi     RIGHT AND LEFT HEART CATH N/A 05/12/2021   Procedure: RIGHT AND LEFT HEART CATH;  Surgeon: Jolaine Artist, MD;  Location: [facility];  Service: Cardiovascular;  Laterality: N/A;   SUBXYPHOID PERICARDIAL WINDOW N/A 07/18/2013   Procedure: SUBXYPHOID PERICARDIAL WINDOW;  Surgeon: Grace Isaac, MD;  Location: [facility] OR;  Service: Thoracic;  Laterality: N/A;   TONSILLECTOMY     total knee replacment     bilat   trigger finger repair     also lue, cts surgically repaired   WISDOM TOOTH EXTRACTION     Family History  Problem Relation Age of  Onset   Depression Mother    Hypertension Mother    Dementia Mother    Heart disease Father        MI   Depression Brother    Stroke Paternal Aunt        CVA   Diabetes Paternal Uncle    Heart disease Paternal Uncle        MI   Heart disease Paternal Grandmother        MI   Diabetes Cousin        PATERNAL   Social History   Socioeconomic History   Marital status: Married    Spouse name: Not on file   Number of children: 1   Years of education: Not on file   Highest education level: Not on file  Occupational History   Occupation: retired-Manager [employer]  Tobacco Use   Smoking status: Former    Packs/day: 0.20    Years: 13.00    Total pack years: 2.60    Types: Cigarettes    Start date: 04/03/1961    Quit date: 08/30/1974    Years since quitting: 47.6   Smokeless tobacco: Never  Vaping Use   Vaping Use: Never used  Substance and Sexual Activity   Alcohol use: No    Alcohol/week: 0.0 standard drinks of alcohol   Drug use: No   Sexual activity: Not Currently  Other Topics Concern   Not on file  Social History Narrative   Lives at home w/ his wife   Right-handed   Caffeine: decaf coffee, occasional tea    Social Determinants of Health   Financial Resource Strain: Low Risk  (12/16/2020)   Overall Financial Resource Strain (CARDIA)    Difficulty of Paying Living Expenses: Not hard at all  Food Insecurity: No Food Insecurity (04/25/2022)   Hunger Vital Sign    Worried About Running Out of Food in the Last Year: Never true    [place] in the Last Year: Never true  Transportation Needs: No Transportation Needs (04/25/2022)   PRAPARE - Hydrologist (Medical): No    Lack of Transportation (Non-Medical): No  Physical Activity: Inactive (04/25/2022)   Exercise Vital Sign    Days of Exercise per Week: 0 days    Minutes of Exercise per Session: 0 min  Stress: No Stress Concern Present (04/25/2022)   [place]    Feeling of Stress : Not at all  Social Connections: Moderately Isolated (04/25/2022)   Social Connection and Isolation Panel [NHANES]    Frequency of Communication with Friends and Family: More than three times a week    Frequency of Social Gatherings with Friends and Family: More than three times a week    Attends Religious Services: Never    Marine scientist or Organizations: No    Attends Music therapist: Never    Marital Status: Married    Tobacco Counseling Counseling given: Not Answered   Clinical Intake:  Pre-visit preparation completed: No  Pain : No/denies pain  Nutrition Risk Assessment:  Has the patient had any N/V/D within the last 2 months?  No  Does the patient have any non-healing wounds?  Yes  Has the patient had any unintentional weight loss or weight gain?  No   Diabetes:  Is the patient diabetic?  Yes  If diabetic, was a CBG obtained today?  No  Did the patient bring in their  glucometer from home?  No  How often do you monitor your CBG's? PRN.   Financial Strains and Diabetes Management:  Are you having any financial strains with  the device, your supplies or your medication? No .  Does the patient want to be seen by Chronic Care Management for management of their diabetes?  No  Would the patient like to be referred to a Nutritionist or for Diabetic Management?  Yes   Diabetic Exams:  Diabetic Eye Exam: Completed No. Overdue for diabetic eye exam. Pt has been advised about the importance in completing this exam. A referral has been placed today. Message sent to referral coordinator for scheduling purposes. Advised pt to expect a call from office referred to regarding appt.  Diabetic Foot Exam: Completed No. Pt has been advised about the importance in completing this exam. Pt is scheduled for diabetic foot exam on Followed by PCP.    BMI - recorded: 50.18 Nutritional Status: BMI > 30  Obese Nutritional Risks: None Diabetes: Yes CBG done?: No Did pt. bring in CBG monitor from home?: No  How often do you need to have someone help you when you read instructions, pamphlets, or other written materials from your doctor or pharmacy?: 2 - Rarely  Diabetic?  Yes  Interpreter Needed?: No  Information entered by :: Rolene Arbour LPN   Activities of Daily Living    04/25/2022    1:58 PM  In your present state of health, do you have any difficulty performing the following activities:  Hearing? 0  Vision? 0  Difficulty concentrating or making decisions? 0  Walking or climbing stairs? 1  Comment Uses Walker, [name] and [employer] or bathing? 1  Comment Wife assist  Doing errands, shopping? 1  Comment [place] and eating ? N  Using the Toilet? N  In the past six months, have you accidently leaked urine? Y  Comment Wears Breifs, Followed by PCP  Do you have problems with loss of bowel control? N  Managing your Medications? N  Managing your Finances? N  Housekeeping or managing your Housekeeping? Y  Comment Wife assist    Patient Care Team: Janith Lima, MD as PCP - General  (Internal Medicine) Debara Pickett [name], MD as PCP - Cardiology (Cardiology) Himmelrich, Bryson Ha, RD (Inactive) as Dietitian Tia Masker) Leta Baptist, MD as Consulting Physician (Otolaryngology) Kathlee Nations, MD as Consulting Physician (Psychiatry) Debara Pickett [name], MD as Consulting Physician (Cardiology) Chyrel Masson, DO (Otolaryngology) Deneise Lever, MD as Consulting Physician (Pulmonary Disease) Opthamology, [facility] as Consulting Physician (Ophthalmology) Delice Bison Darnelle Maffucci, [facility] (Inactive) (Pharmacist)  Indicate any recent Medical Services you may have received from other than [facility] providers in the past year (date may be approximate).     Assessment:   This is a routine wellness examination for [place].  Hearing/Vision screen Hearing Screening - Comments:: Denies hearing difficulties   Vision Screening - Comments:: Wears rx glasses - up to date with routine eye exams with  Coralie Keens  Dietary issues and exercise activities discussed: Current Exercise Habits: The patient does not participate in regular exercise at present, Exercise limited by: orthopedic condition(s)   Goals Addressed               This Visit's Progress     Patient Stated (pt-stated)        My goal is to lose 60 pounds.       Depression Screen    04/25/2022  1:56 PM 05/24/2021    2:11 PM 12/16/2020    4:15 PM 12/07/2020    1:19 PM 11/09/2020    2:11 PM 05/12/2020    1:54 PM 10/27/2019    3:20 PM  PHQ 2/9 Scores  PHQ - 2 Score 0 2 0 2 0 0 0  PHQ- 9 Score  2   0 0     Fall Risk    04/25/2022    2:01 PM 01/27/2022   12:58 PM 06/07/2021    1:24 PM 12/16/2020    3:51 PM 12/07/2020    1:19 PM  Fall Risk   Falls in the past year? '1 1 1 1 1  '$ Number falls in past yr: '1 1 1 1 '$ 0  Comment     Mid August 2022  Injury with Fall? 0 0  0 0  Risk for fall due to : Impaired mobility   Impaired balance/gait   Follow up Education provided;Falls prevention discussed        FALL RISK PREVENTION PERTAINING  TO THE HOME:  Any stairs in or around the home? Yes  If so, are there any without handrails? No  Home free of loose throw rugs in walkways, pet beds, electrical cords, etc? Yes  Adequate lighting in your home to reduce risk of falls? Yes   ASSISTIVE DEVICES UTILIZED TO PREVENT FALLS:  Life alert? No  Use of a cane, walker or w/c? Yes  Grab bars in the bathroom? Yes  Shower chair or bench in shower? Yes  Elevated toilet seat or a handicapped toilet? Yes   TIMED UP AND GO:  Was the test performed? No . Audio Visit  Cognitive Function:        04/25/2022    2:03 PM 10/27/2019    3:25 PM  6CIT Screen  What Year? 0 points 0 points  What month? 0 points 0 points  What time? 0 points 0 points  Count back from 20 0 points 0 points  Months in reverse 0 points 2 points  Repeat phrase 0 points 2 points  Total Score 0 points 4 points    Immunizations Immunization History  Administered Date(s) Administered   Fluad Quad(high Dose 65+) 12/11/2018, 02/18/2021, 02/07/2022   Influenza, High Dose Seasonal PF 01/20/2016, 01/31/2017, 03/06/2018   Influenza,inj,Quad PF,6+ Mos 04/09/2013   Influenza-Unspecified 03/08/2018, 03/02/2020   PFIZER(Purple Top)SARS-COV-2 Vaccination 05/10/2019, 06/04/2019, 03/02/2020, 02/18/2021   Pneumococcal Conjugate-13 10/20/2015   Pneumococcal Polysaccharide-23 04/09/2013, 10/27/2019   Td 11/02/2009   Tdap 01/20/2016    TDAP status: Up to date  Flu Vaccine status: Up to date  Pneumococcal vaccine status: Up to date  Covid-19 vaccine status: Completed vaccines  Qualifies for Shingles Vaccine? Yes   Zostavax completed No   Shingrix Completed?: No.    Education has been provided regarding the importance of this vaccine. Patient has been advised to call insurance company to determine out of pocket expense if they have not yet received this vaccine. Advised may also receive vaccine at local pharmacy or Health Dept. Verbalized acceptance and  understanding.  Screening Tests Health Maintenance  Topic Date Due   Diabetic kidney evaluation - Urine ACR  11/09/2021   OPHTHALMOLOGY EXAM  04/25/2022 (Originally 01/05/2022)   FOOT EXAM  04/26/2022 (Originally 11/09/2021)   COVID-19 Vaccine (5 - 2023-24 season) 05/11/2022 (Originally 12/02/2021)   Zoster Vaccines- Shingrix (1 of 2) 07/25/2022 (Originally 12/21/1964)   HEMOGLOBIN A1C  08/08/2022   Diabetic kidney evaluation - eGFR measurement  02/08/2023  Medicare Annual Wellness (AWV)  04/26/2023   DTaP/Tdap/Td (3 - Td or Tdap) 01/19/2026   Pneumonia Vaccine 20+ Years old  Completed   INFLUENZA VACCINE  Completed   Hepatitis C Screening  Completed   HPV VACCINES  Aged Out   COLONOSCOPY (Pts 45-62yr Insurance coverage will need to be confirmed)  Discontinued    Health Maintenance  Health Maintenance Due  Topic Date Due   Diabetic kidney evaluation - Urine ACR  11/09/2021    Colorectal cancer screening: No longer required.   Lung Cancer Screening: (Low Dose CT Chest recommended if Age 77-80years, 30 pack-year currently smoking OR have quit w/in 15years.) does not qualify.     Additional Screening:  Hepatitis C Screening: does qualify; Completed 07/29/13  Vision Screening: Recommended annual ophthalmology exams for early detection of glaucoma and other disorders of the eye. Is the patient up to date with their annual eye exam?  Yes  Who is the provider or what is the name of the office in which the patient attends annual eye exams[facility] is not established with a provider, would they like to be referred to a provider to establish care? No .   Dental Screening: Recommended annual dental exams for proper oral hygiene  Community Resource Referral / Chronic Care Management:  CRR required this visit?  No   CCM required this visit?  No      Plan:     I have personally reviewed and noted the following in the patient's chart:   Medical and social  history Use of alcohol, tobacco or illicit drugs  Current medications and supplements including opioid prescriptions. Patient is currently taking opioid prescriptions. Information provided to patient regarding non-opioid alternatives. Patient advised to discuss non-opioid treatment plan with their provider. Functional ability and status Nutritional status Physical activity Advanced directives List of other physicians Hospitalizations, surgeries, and ER visits in previous 12 months Vitals Screenings to include cognitive, depression, and falls Referrals and appointments  In addition, I have reviewed and discussed with patient certain preventive protocols, quality metrics, and best practice recommendations. A written personalized care plan for preventive services as well as general preventive health recommendations were provided to patient.     BCriselda Peaches LPN   14/50/3888  Nurse Notes: Patient due Diabetic kidney evaluation-Urine ACR

## 2022-04-25 NOTE — Progress Notes (Signed)
Fusselman, Lonny [name] ([id number]) [id number].pdf Page 1 of 10 Visit Report for 04/25/2022 Chief Complaint Document Details Patient Name: Date of Service: [name], [name]. 04/25/2022 12:30 PM Medical Record Number: [id number] Patient Account Number: [id number] Date of Birth/Sex: Treating RN: 1945/11/22 (77 y.o. M) Primary Care Provider: [name] Other Clinician: Referring Provider: Treating Provider/Extender: [name] in Treatment: 10 Information Obtained from: Patient Chief Complaint Patients presents for treatment of open ulcers secondary to lymphedema and diabetes Electronic Signature(s) Signed: 04/25/2022 1:16:59 PM By: [name] MD FACS Entered By: [name] on 04/25/2022 13:16:59 -------------------------------------------------------------------------------- Debridement Details Patient Name: Date of Service: [name], [name]. 04/25/2022 12:30 PM Medical Record Number: [id number] Patient Account Number: [id number] Date of Birth/Sex: Treating RN: 1945-05-31 (77 y.o. [name] Primary Care Provider: [name] Other Clinician: Referring Provider: Treating Provider/Extender: [name] in Treatment: 10 Debridement Performed for Assessment: Wound #4 Left,Anterior Lower Leg Performed By: Physician [name], MD Debridement Type: Debridement Level of Consciousness (Pre-procedure): Awake and Alert Pre-procedure Verification/Time Out Yes - 13:03 Taken: Start Time: 13:03 T Area Debrided (L x W): otal 0.1 (cm) x 0.1 (cm) = 0.01 (cm) Tissue and other material debrided: Non-Viable, Eschar Level: Non-Viable Tissue Debridement Description: Selective/Open Wound Instrument: Curette Bleeding: Minimum Hemostasis Achieved: Pressure Response to Treatment: Procedure was tolerated well Level of Consciousness (Post- Awake and Alert procedure): Post Debridement  Measurements of Total Wound Length: (cm) 0.1 Width: (cm) 0.1 Depth: (cm) 0.1 Volume: (cm) 0.001 Character of Wound/Ulcer Post Debridement: Improved Post Procedure Diagnosis Same as Pre-procedure Notes [name], [name] ([id number]) [id number] Page 2 of 10 scribed for Dr. Celine Ahr by Adline Peals, RN Electronic Signature(s) Signed: 04/25/2022 3:23:10 PM By: Adline Peals Signed: 04/25/2022 4:15:16 PM By: [name] MD FACS Entered By: Adline Peals on 04/25/2022 13:03:45 -------------------------------------------------------------------------------- HPI Details Patient Name: Date of Service: [name], [name]. 04/25/2022 12:30 PM Medical Record Number: [id number] Patient Account Number: [id number] Date of Birth/Sex: Treating RN: 02/27/1946 (77 y.o. M) Primary Care Provider: [name] Other Clinician: Referring Provider: Treating Provider/Extender: [name] in Treatment: 10 History of Present Illness HPI Description: ADMISSION 02/13/2022 This is [name] 77 year old morbidly obese type II diabetic (last hemoglobin A1c 6.6%) who presented to his primary care provider's office on November 7 with an ulcer on the top of his foot. He said it had been there for about [name] week. He is insensate in his feet and his wife had been caring for it. His PCP took [name] swab from the wound which grew back strep. [name] was prescribed and he was referred to the wound care center for further evaluation and management. In addition to diabetes, he also has pulmonary hypertension and diastolic congestive heart failure. 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 He has been seen at the voice and swallowing disorders clinic in [place] and no further intervention has been felt necessary. On exam, he has severe, at least stage II, lymphedema. In addition to the dorsal foot wound, he has  ulcers on both lower legs. He has [name] left anterior lower leg, [name] right lateral lower leg, and [name] right anterior lower leg wound. ABIs in clinic were noncompressible. 02/22/2022: The right lateral leg wound has closed. The other wounds are about the same size to slightly smaller. All of them have [name] thick layer of rubbery slough on the surface. Edema control is still  suboptimal. 03/02/2022: The left anterior leg wound has deteriorated. His leg is more swollen and red. There is thick slough accumulation on the surface. The right dorsal foot wound is smaller, but also has substantial slough buildup. 03/09/2022: The PCR culture that I took last week was positive for Pseudomonas. Levofloxacin was prescribed and he has [name] few days left of this. His leg looks better and the wound is cleaner. The right dorsal foot wound has [name] very thick layer of slough on the surface. Both wounds are smaller. 03/30/2022: All of the wounds are smaller today and very superficial. The dorsal foot wound on the right is nearly closed. There is just some slough and eschar present. 04/10/2022: The right dorsal foot wound has healed. Unfortunately, his right-sided wrap slid and above this, he developed significant swelling and now has blisters and several new open wounds with slough accumulation. The left anterior leg wound is small with slough and eschar buildup. 04/25/2022: The wounds on his right leg have healed. The wound on his left leg is down to [name] pinhole underneath some eschar. He attempted to cut his own nails and nicked his great toe, creating [name] new wound. It is fairly superficial. Electronic Signature(s) Signed: 04/25/2022 1:18:36 PM By: [name] MD FACS Entered By: [name] on 04/25/2022 13:18:36 -------------------------------------------------------------------------------- Physical Exam Details Patient Name: Date of Service: [name], [name]. 04/25/2022 12:30 PM Medical Record Number: [id number] Patient  Account Number: [id number] Date of Birth/Sex: Treating RN: [birth year] (77 y.o. M) Primary Care Provider: [name] Other Clinician: Referring Provider: Treating Provider/Extender: [name] in Treatment: 10 Constitutional . Slightly bradycardic. . . no acute distress. [name], [name] ([id number]) [id number].pdf Page 3 of 10 Respiratory Normal work of breathing on supplemental oxygen. Notes 04/25/2022: The wounds on his right leg have healed. The wound on his left leg is down to [name] pinhole underneath some eschar. He attempted to cut his own nails and nicked his great toe, creating [name] new wound. It is fairly superficial. Electronic Signature(s) Signed: 04/25/2022 1:19:21 PM By: [name] MD FACS Entered By: [name] on 04/25/2022 13:19:21 -------------------------------------------------------------------------------- Physician Orders Details Patient Name: Date of Service: [name], [name]. 04/25/2022 12:30 PM Medical Record Number: [id number] Patient Account Number: [id number] Date of Birth/Sex: Treating RN: 1945-07-25 (77 y.o. [name] Primary Care Provider: [name] Other Clinician: Referring Provider: Treating Provider/Extender: [name] in Treatment: 10 Verbal / Phone Orders: No Diagnosis Coding ICD-10 Coding Code Description [id number] Non-pressure chronic ulcer of other part of left lower leg with fat layer exposed L97.511 Non-pressure chronic ulcer of other part of right foot limited to breakdown of skin I50.30 Unspecified diastolic (congestive) heart failure J96.11 Chronic respiratory failure with hypoxia L03.115 Cellulitis of right lower limb E11.621 Type 2 diabetes mellitus with foot ulcer E66.01 Morbid (severe) obesity due to excess calories I89.0 Lymphedema, not elsewhere classified Follow-up Appointments ppointment in 1 week. - Dr. Celine Ahr - Room  2 Return [name] Bathing/ Shower/ Hygiene May shower with protection but do not get wound dressing(s) wet. Protect dressing(s) with water repellant cover (for example, large plastic bag) or [name] cast cover and may then take shower. Edema Control - Lymphedema / SCD / Other Elevate legs to the level of the heart or above for 30 minutes daily and/or when sitting for 3-4 times [name] day throughout the day. Avoid standing for long periods of time. Patient to wear own compression stockings every day. - farrow  wrap to right leg Exercise regularly Wound Treatment Wound #4 - Lower Leg Wound Laterality: Left, Anterior Cleanser: Soap and Water 1 x Per Week/30 Days Discharge Instructions: May shower and wash wound with dial antibacterial soap and water prior to dressing change. Cleanser: Wound Cleanser 1 x Per Week/30 Days Discharge Instructions: Cleanse the wound with wound cleanser prior to applying [name] clean dressing using gauze sponges, not tissue or cotton balls. Peri-Wound Care: Sween Lotion (Moisturizing lotion) 1 x Per Week/30 Days Discharge Instructions: Apply moisturizing lotion as directed Prim Dressing: Sorbalgon AG Dressing, 4x4 (in/in) 1 x Per Week/30 Days ary Discharge Instructions: Apply to wound bed as instructed Secondary Dressing: Woven Gauze Sponge, Non-Sterile 4x4 in 1 x Per Week/30 Days Discharge Instructions: Apply over primary dressing as directed. Secondary Dressing: Zetuvit Plus 4x8 in 1 x Per Week/30 Days Smick, Shimshon [name] ([id number]) [id number].pdf Page 4 of 10 Discharge Instructions: Apply over primary dressing as directed. Secured With: Transpore Surgical Tape, 2x10 (in/yd) 1 x Per Week/30 Days Discharge Instructions: Secure dressing with tape as directed. Compression Wrap: Unnaboot w/Calamine, 4x10 (in/yd) 1 x Per Week/30 Days Discharge Instructions: Apply Unnaboot as directed. Compression Wrap: Kerlix Roll 4.5x3.1 (in/yd) 1 x Per Week/30 Days Discharge  Instructions: Apply Kerlix and Coban compression as directed. Compression Wrap: Coban Self-Adherent Wrap 4x5 (in/yd) 1 x Per Week/30 Days Discharge Instructions: Apply over Kerlix as directed. Compression Stockings: Jobst Farrow Wrap 4000 Left Leg Compression Amount: 30-40 mmHG Discharge Instructions: Apply Francia Greaves daily as instructed. Apply first thing in the morning, remove at night before bed. Wound #7 - T Great oe Wound Laterality: Left Cleanser: Soap and Water Discharge Instructions: May shower and wash wound with dial antibacterial soap and water prior to dressing change. Cleanser: Wound Cleanser Discharge Instructions: Cleanse the wound with wound cleanser prior to applying [name] clean dressing using gauze sponges, not tissue or cotton balls. Prim Dressing: Sorbalgon AG Dressing 2x2 (in/in) ary Discharge Instructions: Apply to wound bed as instructed Secondary Dressing: Woven Gauze Sponges 2x2 in Discharge Instructions: Apply over primary dressing as directed. Secured With: Scientist, forensic, Sterile 4x75 (in/in) Discharge Instructions: Secure with stretch gauze as directed. Electronic Signature(s) Signed: 04/25/2022 4:15:16 PM By: [name] MD FACS Entered By: [name] on 04/25/2022 13:19:41 -------------------------------------------------------------------------------- Problem List Details Patient Name: Date of Service: [name], Ian [name]. 04/25/2022 12:30 PM Medical Record Number: [id number] Patient Account Number: [id number] Date of Birth/Sex: Treating RN: 1945-11-30 (77 y.o. M) Primary Care Provider: [name] Other Clinician: Referring Provider: Treating Provider/Extender: [name] in Treatment: 10 Active Problems ICD-10 Encounter Code Description Active Date MDM Diagnosis [phone] Non-pressure chronic ulcer of other part of left lower leg with fat layer exposed11/13/2023 No Yes L97.511 Non-pressure  chronic ulcer of other part of right foot limited to breakdown of 04/25/2022 No Yes skin I50.30 Unspecified diastolic (congestive) heart failure 02/13/2022 No Yes [name], [name] ([id number]) [phone].pdf Page 5 of 10 J96.11 Chronic respiratory failure with hypoxia 02/13/2022 No Yes L03.115 Cellulitis of right lower limb 02/13/2022 No Yes E11.621 Type 2 diabetes mellitus with foot ulcer 02/13/2022 No Yes E66.01 Morbid (severe) obesity due to excess calories 02/13/2022 No Yes I89.0 Lymphedema, not elsewhere classified 02/13/2022 No Yes Inactive Problems ICD-10 Code Description Active Date Inactive Date L97.512 Non-pressure chronic ulcer of other part of right foot with fat layer exposed 02/13/2022 02/13/2022 Resolved Problems ICD-10 Code Description Active Date Resolved Date L97.812 Non-pressure chronic ulcer of other part of right lower  leg with fat layer exposed 02/13/2022 02/13/2022 Electronic Signature(s) Signed: 04/25/2022 1:16:24 PM By: [name] MD FACS Entered By: [name] on 04/25/2022 13:16:23 -------------------------------------------------------------------------------- Progress Note Details Patient Name: Date of Service: [name], [name]. 04/25/2022 12:30 PM Medical Record Number: [id number] Patient Account Number: [id number] Date of Birth/Sex: Treating RN: Dec 27, 1945 (77 y.o. M) Primary Care Provider: [name] Other Clinician: Referring Provider: Treating Provider/Extender: [name] in Treatment: 10 Subjective Chief Complaint Information obtained from Patient Patients presents for treatment of open ulcers secondary to lymphedema and diabetes History of Present Illness (HPI) ADMISSION 02/13/2022 This is [name] 77 year old morbidly obese type II diabetic (last hemoglobin A1c 6.6%) who presented to his primary care provider's office on November 7 with an ulcer on the top of his foot. He said it  had been there for about [name] week. He is insensate in his feet and his wife had been caring for it. His PCP took [name] swab from the wound which grew back strep. [name] was prescribed and he was referred to the wound care center for further evaluation and management. In addition to diabetes, he also has pulmonary hypertension and diastolic congestive heart failure. 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 He has been seen at the voice and swallowing disorders clinic in [place] and no further intervention has been felt necessary. On exam, he has severe, at least stage II, lymphedema. In addition to the dorsal foot wound, he has ulcers on both lower legs. He has [name] left anterior lower leg, [name] right lateral lower leg, and [name] right anterior lower leg wound. ABIs in clinic were noncompressible. [name], [name] ([id number]) [id number].pdf Page 6 of 10 02/22/2022: The right lateral leg wound has closed. The other wounds are about the same size to slightly smaller. All of them have [name] thick layer of rubbery slough on the surface. Edema control is still suboptimal. 03/02/2022: The left anterior leg wound has deteriorated. His leg is more swollen and red. There is thick slough accumulation on the surface. The right dorsal foot wound is smaller, but also has substantial slough buildup. 03/09/2022: The PCR culture that I took last week was positive for Pseudomonas. Levofloxacin was prescribed and he has [name] few days left of this. His leg looks better and the wound is cleaner. The right dorsal foot wound has [name] very thick layer of slough on the surface. Both wounds are smaller. 03/30/2022: All of the wounds are smaller today and very superficial. The dorsal foot wound on the right is nearly closed. There is just some slough and eschar present. 04/10/2022: The right dorsal foot wound has healed. Unfortunately, his right-sided wrap slid and above  this, he developed significant swelling and now has blisters and several new open wounds with slough accumulation. The left anterior leg wound is small with slough and eschar buildup. 04/25/2022: The wounds on his right leg have healed. The wound on his left leg is down to [name] pinhole underneath some eschar. He attempted to cut his own nails and nicked his great toe, creating [name] new wound. It is fairly superficial. Patient History Information obtained from Patient. Family History Cancer - Paternal Grandparents, Heart Disease - Maternal Grandparents,Father, No family history of Diabetes, Hereditary Spherocytosis, Hypertension, Kidney Disease, Lung Disease, Seizures, Stroke, Thyroid Problems, Tuberculosis. Social History Former smoker - smoked as [name] teen, Marital Status - Married, Alcohol Use - Never, Drug Use - No History,  Caffeine Use - Never. Medical History Hematologic/Lymphatic Patient has history of Anemia, Lymphedema Respiratory Patient has history of Sleep Apnea Cardiovascular Patient has history of Arrhythmia - PAF, Congestive Heart Failure - diastolic, Hypertension Endocrine Patient has history of Type II Diabetes - takes no meds, "borderline A1c" Neurologic Patient has history of Neuropathy Oncologic Denies history of Received Chemotherapy, Received Radiation Hospitalization/Surgery History - right and left heart cath. - colonoscopy. - esophagogastroduodenoscopy. - subxyphoid pericardial window. - intraoperative transesophageal echocardiogram. - laparoscopic gastric banding with hiatal hernia repair. - breath tek h pylori. - cholecysectomy. - arthroscopy knee w/ drilling. - lithotripsy. - tonsillectomy. - total knee replacement. - trigger finger repair. - wisdom tooth extraction. Medical [name] Surgical History Notes nd Constitutional Symptoms (General Health) obsesity Hematologic/Lymphatic chronic anticoagulation Cardiovascular pericarditis Gastrointestinal pt says the doctor told  him he had an unknown type of hepatitis but they could not prove it Genitourinary BPH Neurologic 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, embolic cerebral infarction, CVA Psychiatric depression Objective Constitutional Slightly bradycardic. no acute distress. Vitals Time Taken: 12:38 PM, Height: 72 in, Weight: 375 lbs, BMI: 50.9, Temperature: 98 F, Pulse: 58 bpm, Respiratory Rate: 18 breaths/min, Blood Pressure: 130/70 mmHg. Respiratory Normal work of breathing on supplemental oxygen. General Notes: 04/25/2022: The wounds on his right leg have healed. The wound on his left leg is down to [name] pinhole underneath some eschar. He attempted to cut his own nails and nicked his great toe, creating [name] new wound. It is fairly superficial. Integumentary (Hair, Skin) Wound #4 status is Open. Original cause of wound was Blister. The date acquired was: 02/13/2021. The wound has been in treatment 10 weeks. The wound is located on the Left,Anterior Lower Leg. The wound measures 0.1cm length x 0.1cm width x 0.1cm depth; 0.008cm^2 area and 0.001cm^3 volume. There is no [name], [name] ([id number]) [phone].pdf Page 7 of 10 tunneling or undermining noted. There is [name] none present amount of drainage noted. The wound margin is distinct with the outline attached to the wound base. There is no granulation within the wound bed. There is [name] large (67-100%) amount of necrotic tissue within the wound bed including Eschar. The periwound skin appearance had no abnormalities noted for texture. The periwound skin appearance exhibited: Hemosiderin Staining, Erythema. The periwound skin appearance did not exhibit: Dry/Scaly. The surrounding wound skin color is noted with erythema. Periwound temperature was noted as No Abnormality. Wound #5 status is Healed - Epithelialized. Original cause of wound was Gradually Appeared. The date acquired was: 03/30/2022. The wound has been in treatment 3 weeks. The wound is located  on the Right,Anterior Lower Leg. The wound measures 0cm length x 0cm width x 0cm depth; 0cm^2 area and 0cm^3 volume. There is no tunneling or undermining noted. There is [name] none present amount of drainage noted. The wound margin is distinct with the outline attached to the wound base. There is no granulation within the wound bed. There is no necrotic tissue within the wound bed. The periwound skin appearance had no abnormalities noted for texture. The periwound skin appearance had no abnormalities noted for moisture. The periwound skin appearance exhibited: Hemosiderin Staining. Periwound temperature was noted as No Abnormality. Wound #6 status is Healed - Epithelialized. Original cause of wound was Blister. The date acquired was: 04/10/2022. The wound has been in treatment 2 weeks. The wound is located on the Right,Proximal,Anterior Lower Leg. The wound measures 0cm length x 0cm width x 0cm depth; 0cm^2 area and 0cm^3 volume. There is no tunneling or undermining noted. There  is [name] none present amount of drainage noted. The wound margin is distinct with the outline attached to the wound base. There is no granulation within the wound bed. There is no necrotic tissue within the wound bed. The periwound skin appearance had no abnormalities noted for texture. The periwound skin appearance had no abnormalities noted for moisture. The periwound skin appearance exhibited: Erythema. The surrounding wound skin color is noted with erythema. Periwound temperature was noted as No Abnormality. Wound #7 status is Open. Original cause of wound was Trauma. The date acquired was: 04/25/2022. The wound is located on the Left T Great. The wound oe measures 0.3cm length x 0.3cm width x 0.1cm depth; 0.071cm^2 area and 0.007cm^3 volume. There is Fat Layer (Subcutaneous Tissue) exposed. There is no tunneling or undermining noted. There is [name] medium amount of serosanguineous drainage noted. The wound margin is distinct with the  outline attached to the wound base. There is large (67-100%) red granulation within the wound bed. There is no necrotic tissue within the wound bed. The periwound skin appearance had no abnormalities noted for moisture. The periwound skin appearance had no abnormalities noted for color. The periwound skin appearance exhibited: Callus. Periwound temperature was noted as No Abnormality. Assessment Active Problems ICD-10 Non-pressure chronic ulcer of other part of left lower leg with fat layer exposed Non-pressure chronic ulcer of other part of right foot limited to breakdown of skin Unspecified diastolic (congestive) heart failure Chronic respiratory failure with hypoxia Cellulitis of right lower limb Type 2 diabetes mellitus with foot ulcer Morbid (severe) obesity due to excess calories Lymphedema, not elsewhere classified Procedures Wound #4 Pre-procedure diagnosis of Wound #4 is [name] Lymphedema located on the Left,Anterior Lower Leg . There was [name] Selective/Open Wound Non-Viable Tissue Debridement with [name] total area of 0.01 sq cm performed by [name], MD. With the following instrument(s): Curette to remove Non-Viable tissue/material. Material removed includes Eschar. No specimens were taken. [name] time out was conducted at 13:03, prior to the start of the procedure. [name] Minimum amount of bleeding was controlled with Pressure. The procedure was tolerated well. Post Debridement Measurements: 0.1cm length x 0.1cm width x 0.1cm depth; 0.001cm^3 volume. Character of Wound/Ulcer Post Debridement is improved. Post procedure Diagnosis Wound #4: Same as Pre-Procedure General Notes: scribed for Dr. Celine Ahr by Adline Peals, RN. Pre-procedure diagnosis of Wound #4 is [name] Lymphedema located on the Left,Anterior Lower Leg . There was [name] Haematologist Compression Therapy Procedure by Adline Peals, RN. Post procedure Diagnosis Wound #4: Same as Pre-Procedure Plan Follow-up Appointments: Return  Appointment in 1 week. - Dr. Celine Ahr - Room 2 Bathing/ Shower/ Hygiene: May shower with protection but do not get wound dressing(s) wet. Protect dressing(s) with water repellant cover (for example, large plastic bag) or [name] cast cover and may then take shower. Edema Control - Lymphedema / SCD / Other: Elevate legs to the level of the heart or above for 30 minutes daily and/or when sitting for 3-4 times [name] day throughout the day. Avoid standing for long periods of time. Patient to wear own compression stockings every day. - farrow wrap to right leg Exercise regularly WOUND #4: - Lower Leg Wound Laterality: Left, Anterior Cleanser: Soap and Water 1 x Per Week/30 Days Discharge Instructions: May shower and wash wound with dial antibacterial soap and water prior to dressing change. Cleanser: Wound Cleanser 1 x Per Week/30 Days Discharge Instructions: Cleanse the wound with wound cleanser prior to applying [name] clean dressing using gauze sponges, not tissue  or cotton balls. Peri-Wound Care: Sween Lotion (Moisturizing lotion) 1 x Per Week/30 Days Discharge Instructions: Apply moisturizing lotion as directed [name], [name] ([id number]) [phone].pdf Page 8 of 10 Prim Dressing: Sorbalgon AG Dressing, 4x4 (in/in) 1 x Per Week/30 Days ary Discharge Instructions: Apply to wound bed as instructed Secondary Dressing: Woven Gauze Sponge, Non-Sterile 4x4 in 1 x Per Week/30 Days Discharge Instructions: Apply over primary dressing as directed. Secondary Dressing: Zetuvit Plus 4x8 in 1 x Per Week/30 Days Discharge Instructions: Apply over primary dressing as directed. Secured With: Transpore Surgical T ape, 2x10 (in/yd) 1 x Per Week/30 Days Discharge Instructions: Secure dressing with tape as directed. Com pression Wrap: Unnaboot w/Calamine, 4x10 (in/yd) 1 x Per Week/30 Days Discharge Instructions: Apply Unnaboot as directed. Com pression Wrap: Kerlix Roll 4.5x3.1 (in/yd) 1 x Per  Week/30 Days Discharge Instructions: Apply Kerlix and Coban compression as directed. Com pression Wrap: Coban Self-Adherent Wrap 4x5 (in/yd) 1 x Per Week/30 Days Discharge Instructions: Apply over Kerlix as directed. Com pression Stockings: Jobst Farrow Wrap 4000 Compression Amount: 30-40 mmHg (left) Discharge Instructions: Apply Francia Greaves daily as instructed. Apply first thing in the morning, remove at night before bed. WOUND #7: - T Great Wound Laterality: Left oe Cleanser: Soap and Water Discharge Instructions: May shower and wash wound with dial antibacterial soap and water prior to dressing change. Cleanser: Wound Cleanser Discharge Instructions: Cleanse the wound with wound cleanser prior to applying [name] clean dressing using gauze sponges, not tissue or cotton balls. Prim Dressing: Sorbalgon AG Dressing 2x2 (in/in) ary Discharge Instructions: Apply to wound bed as instructed Secondary Dressing: Woven Gauze Sponges 2x2 in Discharge Instructions: Apply over primary dressing as directed. Secured With: Scientist, forensic, Sterile 4x75 (in/in) Discharge Instructions: Secure with stretch gauze as directed. 04/25/2022: The wounds on his right leg have healed. The wound on his left leg is down to [name] pinhole underneath some eschar. He attempted to cut his own nails and nicked his great toe, creating [name] new wound. It is fairly superficial. I used [name] curette to debride eschar off of the wound on his left leg. No debridement was necessary for the toe. We will apply silver alginate to both sites. Kerlix and Coban wrap. I anticipate that both wounds will likely be healed at his next visit. We will apply his Wallie Char wrap to the healed right leg and teach the patient and his wife how to do it themselves. Follow-up in 1 week. Electronic Signature(s) Signed: 04/25/2022 3:23:10 PM By: Adline Peals Signed: 04/25/2022 4:15:16 PM By: [name] MD FACS Previous Signature:  04/25/2022 1:21:25 PM Version By: [name] MD FACS Previous Signature: 04/25/2022 1:20:27 PM Version By: [name] MD FACS Entered By: Adline Peals on 04/25/2022 15:22:16 -------------------------------------------------------------------------------- HxROS Details Patient Name: Date of Service: [name] 04/25/2022 12:30 PM Medical Record Number: [id number] Patient Account Number: [id number] Date of Birth/Sex: Treating RN: [birth year] (77 y.o. M) Primary Care Provider: [name] Other Clinician: Referring Provider: Treating Provider/Extender: [name] in Treatment: 10 Information Obtained From Patient Constitutional Symptoms (General Health) Medical History: Past Medical History Notes: obsesity Hematologic/Lymphatic Medical History: Positive for: Anemia; Lymphedema Past Medical History Notes: chronic anticoagulation Respiratory Medical HistoryKEYVON, HERTER [name] ([id number]) [id number] Page 9 of 10 Positive for: Sleep Apnea Cardiovascular Medical History: Positive for: Arrhythmia - PAF; Congestive Heart Failure - diastolic; Hypertension Past Medical History Notes: pericarditis Gastrointestinal Medical History: Past Medical History Notes: pt says the doctor told him  he had an unknown type of hepatitis but they could not prove it Endocrine Medical History: Positive for: Type II Diabetes - takes no meds, "borderline A1c" Time with diabetes: 2-3 years ago Blood sugar tested every day: No Genitourinary Medical History: Past Medical History Notes: BPH Neurologic Medical History: Positive for: Neuropathy Past Medical History Notes: 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, embolic cerebral infarction, CVA Oncologic Medical History: Negative for: Received Chemotherapy; Received Radiation Psychiatric Medical History: Past Medical History Notes: depression Immunizations Pneumococcal  Vaccine: Received Pneumococcal Vaccination: Yes Received Pneumococcal Vaccination On or After 60th Birthday: Yes Implantable Devices None Hospitalization / Surgery History Type of Hospitalization/Surgery right and left heart cath colonoscopy esophagogastroduodenoscopy subxyphoid pericardial window intraoperative transesophageal echocardiogram laparoscopic gastric banding with hiatal hernia repair breath tek h pylori cholecysectomy arthroscopy knee w/ drilling lithotripsy tonsillectomy total knee replacement trigger finger repair wisdom tooth extraction Family and Social History Cancer: Yes - Paternal Grandparents; Diabetes: No; Heart Disease: Yes - Maternal Grandparents,Father; Hereditary Spherocytosis: No; Hypertension: No; Kidney Disease: No; Lung Disease: No; Seizures: No; Stroke: No; Thyroid Problems: No; Tuberculosis: No; Former smoker - smoked as [name] teen; Marital Status - Married; Alcohol Use: Never; Drug Use: No History; Caffeine Use: Never; Financial Concerns: No; Food, Clothing or Shelter Needs: No; Support System Lacking: No; Transportation Concerns: No Derocher, Rhyatt [name] ([id number]) [phone].pdf Page 10 of 10 Electronic Signature(s) Signed: 04/25/2022 4:15:16 PM By: [name] MD FACS Entered By: [name] on 04/25/2022 13:18:42 -------------------------------------------------------------------------------- SuperBill Details Patient Name: Date of Service: [name], Emilliano [name]. 04/25/2022 Medical Record Number: [id number] Patient Account Number: [id number] Date of Birth/Sex: Treating RN: 09/13/1945 (77 y.o. M) Primary Care Provider: [name] Other Clinician: Referring Provider: Treating Provider/Extender: [name] in Treatment: 10 Diagnosis Coding ICD-10 Codes Code Description [phone] Non-pressure chronic ulcer of other part of left lower leg with fat layer exposed L97.511 Non-pressure chronic  ulcer of other part of right foot limited to breakdown of skin I50.30 Unspecified diastolic (congestive) heart failure J96.11 Chronic respiratory failure with hypoxia L03.115 Cellulitis of right lower limb E11.621 Type 2 diabetes mellitus with foot ulcer E66.01 Morbid (severe) obesity due to excess calories I89.0 Lymphedema, not elsewhere classified Facility Procedures : CPT4 Code: [id number] Description: 16945 - DEBRIDE WOUND 1ST 20 SQ CM OR < ICD-10 Diagnosis Description L97.822 Non-pressure chronic ulcer of other part of left lower leg with fat layer expose Modifier: d Quantity: 1 Physician Procedures : CPT4 Code Description Modifier [id number] - WC PHYS LEVEL 3 - EST PT 25 ICD-10 Diagnosis Description L97.822 Non-pressure chronic ulcer of other part of left lower leg with fat layer exposed L97.511 Non-pressure chronic ulcer of other part of right  foot limited to breakdown of skin I50.30 Unspecified diastolic (congestive) heart failure I89.0 Lymphedema, not elsewhere classified Quantity: 1 : [id number] - WC PHYS DEBR WO ANESTH 20 SQ CM ICD-10 Diagnosis Description L97.822 Non-pressure chronic ulcer of other part of left lower leg with fat layer exposed Quantity: 1 Electronic Signature(s) Signed: 04/25/2022 1:20:54 PM By: [name] MD FACS Entered By: [name] on 04/25/2022 13:20:54

## 2022-04-26 ENCOUNTER — Other Ambulatory Visit: Payer: Self-pay

## 2022-05-02 ENCOUNTER — Encounter (HOSPITAL_BASED_OUTPATIENT_CLINIC_OR_DEPARTMENT_OTHER): Payer: Medicare Other | Admitting: General Surgery

## 2022-05-02 DIAGNOSIS — E11622 Type 2 diabetes mellitus with other skin ulcer: Secondary | ICD-10-CM | POA: Diagnosis not present

## 2022-05-02 DIAGNOSIS — L97812 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d: Secondary | ICD-10-CM | POA: Diagnosis not present

## 2022-05-02 DIAGNOSIS — L97822 Non-pressure chronic ulcer of other part of left lower leg with fat layer exposed: Secondary | ICD-10-CM | POA: Diagnosis not present

## 2022-05-02 DIAGNOSIS — I890 Lymphedema, not elsewhere classified: Secondary | ICD-10-CM | POA: Diagnosis not present

## 2022-05-02 DIAGNOSIS — E11621 Type 2 diabetes mellitus with foot ulcer: Secondary | ICD-10-CM | POA: Diagnosis not present

## 2022-05-02 DIAGNOSIS — L03115 Cellulitis of right lower limb: Secondary | ICD-10-CM | POA: Diagnosis not present

## 2022-05-02 DIAGNOSIS — J3801 Paralysis of vocal cords and larynx, unilateral: Secondary | ICD-10-CM | POA: Diagnosis not present

## 2022-05-02 NOTE — Progress Notes (Signed)
[name] ([id number]) [id number] Page 1 of 9 Visit Report for 05/02/2022 Chief Complaint Document Details Patient Name: Date of Service: [name], [name]. 05/02/2022 2:00 PM Medical Record Number: [id number] Patient Account Number: [id number] Date of Birth/Sex: Treating RN: [birth year] (77 y.o. M) Primary Care Provider: [name] Other Clinician: Referring Provider: Treating Provider/Extender: [name] in Treatment: 11 Information Obtained from: Patient Chief Complaint Patients presents for treatment of open ulcers secondary to lymphedema and diabetes Electronic Signature(s) Signed: 05/02/2022 2:30:21 PM By: [name] MD FACS Entered By: [name] on 05/02/2022 14:30:21 -------------------------------------------------------------------------------- HPI Details Patient Name: Date of Service: [name], [name]. 05/02/2022 2:00 PM Medical Record Number: [id number] Patient Account Number: [id number] Date of Birth/Sex: Treating RN: [birth year] (77 y.o. M) Primary Care Provider: [name] Other Clinician: Referring Provider: Treating Provider/Extender: [name] in Treatment: 11 History of Present Illness HPI Description: ADMISSION 02/13/2022 This is [name] 77 year old morbidly obese type II diabetic (last hemoglobin A1c 6.6%) who presented to his primary care provider's office on November 7 with an ulcer on the top of his foot. He said it had been there for about [name] week. He is insensate in his feet and his wife had been caring for it. His PCP took [name] swab from the wound which grew back strep. [name] was prescribed and he was referred to the wound care center for further evaluation and management. In addition to diabetes, he also has pulmonary hypertension and diastolic congestive heart failure. 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 He has been seen at the voice and swallowing disorders clinic in [place] and no further intervention has been felt necessary. On exam, he has severe, at least stage II, lymphedema. In addition to the dorsal foot wound, he has ulcers on both lower legs. He has [name] left anterior lower leg, [name] right lateral lower leg, and [name] right anterior lower leg wound. ABIs in clinic were noncompressible. 02/22/2022: The right lateral leg wound has closed. The other wounds are about the same size to slightly smaller. All of them have [name] thick layer of rubbery slough on the surface. Edema control is still suboptimal. 03/02/2022: The left anterior leg wound has deteriorated. His leg is more swollen and red. There is thick slough accumulation on the surface. The right dorsal foot wound is smaller, but also has substantial slough buildup. 03/09/2022: The PCR culture that I took last week was positive for Pseudomonas. Levofloxacin was prescribed and he has [name] few days left of this. His leg looks better and the wound is cleaner. The right dorsal foot wound has [name] very thick layer of slough on the surface. Both wounds are smaller. 03/30/2022: All of the wounds are smaller today and very superficial. The dorsal foot wound on the right is nearly closed. There is just some slough and eschar present. 04/10/2022: The right dorsal foot wound has healed. Unfortunately, his right-sided wrap slid and above this, he developed significant swelling and now has blisters and several new open wounds with slough accumulation. The left anterior leg wound is small with slough and eschar buildup. 04/25/2022: The wounds on his right leg have healed. The wound on his left leg is down to [name] pinhole underneath some eschar. He attempted to cut his own nails and nicked his great toe, creating [name] new wound. It is fairly superficial. 05/02/2022: His left leg wound and left great  toe wound have healed. Unfortunately, he was out of his compression and  developed [name] new wound on his right leg. It is superficial and actually looks more like [name] scratch than anything else. Edema control on the left is quite good, well on the right it is rather abysmal. Dezeeuw, [name] ([id number]) [id number] Page 2 of 9 Electronic Signature(s) Signed: 05/02/2022 2:31:22 PM By: [name] MD FACS Entered By: [name] on 05/02/2022 14:31:21 -------------------------------------------------------------------------------- Physical Exam Details Patient Name: Date of Service: [name], [name]. 05/02/2022 2:00 PM Medical Record Number: [id number] Patient Account Number: [id number] Date of Birth/Sex: Treating RN: 1946-03-19 (77 y.o. M) Primary Care Provider: [name] Other Clinician: Referring Provider: Treating Provider/Extender: [name] in Treatment: 11 Constitutional Slightly hypertensive. . . . no acute distress. Respiratory Normal work of breathing on supplemental oxygen. Notes 05/02/2022: His left leg wound and left great toe wound have healed. Unfortunately, he was out of his compression and developed [name] new wound on his right leg. It is superficial and actually looks more like [name] scratch than anything else. Edema control on the left is quite good, well on the right it is rather abysmal. Electronic Signature(s) Signed: 05/02/2022 2:37:08 PM By: [name] MD FACS Previous Signature: 05/02/2022 2:33:01 PM Version By: [name] MD FACS Entered By: [name] on 05/02/2022 14:37:08 -------------------------------------------------------------------------------- Physician Orders Details Patient Name: Date of Service: [name], [name]. 05/02/2022 2:00 PM Medical Record Number: [id number] Patient Account Number: [id number] Date of Birth/Sex: Treating RN: April 25, 1945 (77 y.o. [name] Primary Care Provider: [name] Other Clinician: Referring  Provider: Treating Provider/Extender: [name] in Treatment: 11 Verbal / Phone Orders: No Diagnosis Coding ICD-10 Coding Code Description L97.811 Non-pressure chronic ulcer of other part of right lower leg limited to breakdown of skin I50.30 Unspecified diastolic (congestive) heart failure J96.11 Chronic respiratory failure with hypoxia E66.01 Morbid (severe) obesity due to excess calories I89.0 Lymphedema, not elsewhere classified Follow-up Appointments ppointment in 1 week. - Dr. Celine Ahr - Room 2 Return [name] Anesthetic (In clinic) Topical Lidocaine 4% applied to wound bed Bathing/ Shower/ Hygiene Lennon, [place] ([id number]) [id number] Page 3 of 9 May shower with protection but do not get wound dressing(s) wet. Protect dressing(s) with water repellant cover (for example, large plastic bag) or [name] cast cover and may then take shower. Edema Control - Lymphedema / SCD / Other Elevate legs to the level of the heart or above for 30 minutes daily and/or when sitting for 3-4 times [name] day throughout the day. Avoid standing for long periods of time. Patient to wear own compression stockings every day. - farrow wrap to right leg Exercise regularly Wound Treatment Wound #8 - Lower Leg Wound Laterality: Right, Medial Cleanser: Soap and Water 1 x Per Week/10 Days Discharge Instructions: May shower and wash wound with dial antibacterial soap and water prior to dressing change. Cleanser: Wound Cleanser 1 x Per Week/10 Days Discharge Instructions: Cleanse the wound with wound cleanser prior to applying [name] clean dressing using gauze sponges, not tissue or cotton balls. Peri-Wound Care: Sween Lotion (Moisturizing lotion) 1 x Per Week/10 Days Discharge Instructions: Apply moisturizing lotion as directed Prim Dressing: Sorbalgon AG Dressing 2x2 (in/in) 1 x Per Week/10 Days ary Discharge Instructions: Apply to wound bed as instructed Secondary  Dressing: Woven Gauze Sponge, Non-Sterile 4x4 in 1 x Per Week/10 Days Discharge Instructions: Apply over primary dressing as directed. Compression Wrap: Unnaboot w/Calamine, 4x10 (in/yd) 1  x Per Week/10 Days Discharge Instructions: Apply Unnaboot as directed. Patient Medications llergies: morphine, penicillin, codeine, propoxyphene, Darvocet-[name] [name] Notifications Medication Indication Start End 05/02/2022 lidocaine DOSE topical 4 % cream - cream topical Electronic Signature(s) Signed: 05/02/2022 3:42:54 PM By: [name] MD FACS Entered By: [name] on 05/02/2022 14:33:48 -------------------------------------------------------------------------------- Problem List Details Patient Name: Date of Service: [name], [name]. 05/02/2022 2:00 PM Medical Record Number: [id number] Patient Account Number: [id number] Date of Birth/Sex: Treating RN: 30-Jul-1945 (77 y.o. M) Primary Care Provider: [name] Other Clinician: Referring Provider: Treating Provider/Extender: [name] in Treatment: 11 Active Problems ICD-10 Encounter Code Description Active Date MDM Diagnosis L97.811 Non-pressure chronic ulcer of other part of right lower leg limited to breakdown 05/02/2022 No Yes of skin I50.30 Unspecified diastolic (congestive) heart failure 02/13/2022 No Yes J96.11 Chronic respiratory failure with hypoxia 02/13/2022 No Yes Janssens, Damione [name] ([id number]) [id number] Page 4 of 9 E66.01 Morbid (severe) obesity due to excess calories 02/13/2022 No Yes I89.0 Lymphedema, not elsewhere classified 02/13/2022 No Yes Inactive Problems ICD-10 Code Description Active Date Inactive Date L97.512 Non-pressure chronic ulcer of other part of right foot with fat layer exposed 02/13/2022 02/13/2022 Resolved Problems ICD-10 Code Description Active Date Resolved Date L97.812 Non-pressure chronic ulcer of other part of right lower leg with fat  layer exposed 02/13/2022 02/13/2022 M01.027 Non-pressure chronic ulcer of other part of left lower leg with fat layer exposed 02/13/2022 02/13/2022 L97.511 Non-pressure chronic ulcer of other part of right foot limited to breakdown of skin 04/25/2022 04/25/2022 E11.621 Type 2 diabetes mellitus with foot ulcer 02/13/2022 02/13/2022 L03.115 Cellulitis of right lower limb 02/13/2022 02/13/2022 Electronic Signature(s) Signed: 05/02/2022 2:29:10 PM By: [name] MD FACS Entered By: [name] on 05/02/2022 14:29:10 -------------------------------------------------------------------------------- Progress Note Details Patient Name: Date of Service: [name], [name]. 05/02/2022 2:00 PM Medical Record Number: [id number] Patient Account Number: [id number] Date of Birth/Sex: Treating RN: 1946/04/01 (77 y.o. M) Primary Care Provider: [name] Other Clinician: Referring Provider: Treating Provider/Extender: [name] in Treatment: 11 Subjective Chief Complaint Information obtained from Patient Patients presents for treatment of open ulcers secondary to lymphedema and diabetes History of Present Illness (HPI) ADMISSION 02/13/2022 This is [name] 77 year old morbidly obese type II diabetic (last hemoglobin A1c 6.6%) who presented to his primary care provider's office on November 7 with an ulcer on the top of his foot. He said it had been there for about [name] week. He is insensate in his feet and his wife had been caring for it. His PCP took [name] swab from the wound which grew back strep. [name] was prescribed and he was referred to the wound care center for further evaluation and management. In addition to diabetes, he also has pulmonary hypertension and diastolic congestive heart failure. 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 He has been seen at the voice and swallowing disorders clinic in [place] and no  further intervention has been felt necessary. Zolman, Yu [name] ([id number]) [id number] Page 5 of 9 On exam, he has severe, at least stage II, lymphedema. In addition to the dorsal foot wound, he has ulcers on both lower legs. He has [name] left anterior lower leg, [name] right lateral lower leg, and [name] right anterior lower leg wound. ABIs in clinic were noncompressible. 02/22/2022: The right lateral leg wound has closed. The other wounds are about the same size to slightly smaller. All of them  have [name] thick layer of rubbery slough on the surface. Edema control is still suboptimal. 03/02/2022: The left anterior leg wound has deteriorated. His leg is more swollen and red. There is thick slough accumulation on the surface. The right dorsal foot wound is smaller, but also has substantial slough buildup. 03/09/2022: The PCR culture that I took last week was positive for Pseudomonas. Levofloxacin was prescribed and he has [name] few days left of this. His leg looks better and the wound is cleaner. The right dorsal foot wound has [name] very thick layer of slough on the surface. Both wounds are smaller. 03/30/2022: All of the wounds are smaller today and very superficial. The dorsal foot wound on the right is nearly closed. There is just some slough and eschar present. 04/10/2022: The right dorsal foot wound has healed. Unfortunately, his right-sided wrap slid and above this, he developed significant swelling and now has blisters and several new open wounds with slough accumulation. The left anterior leg wound is small with slough and eschar buildup. 04/25/2022: The wounds on his right leg have healed. The wound on his left leg is down to [name] pinhole underneath some eschar. He attempted to cut his own nails and nicked his great toe, creating [name] new wound. It is fairly superficial. 05/02/2022: His left leg wound and left great toe wound have healed. Unfortunately, he was out of his compression and developed  [name] new wound on his right leg. It is superficial and actually looks more like [name] scratch than anything else. Edema control on the left is quite good, well on the right it is rather abysmal. Patient History Information obtained from Patient. Family History Cancer - Paternal Grandparents, Heart Disease - Maternal Grandparents,Father, No family history of Diabetes, Hereditary Spherocytosis, Hypertension, Kidney Disease, Lung Disease, Seizures, Stroke, Thyroid Problems, Tuberculosis. Social History Former smoker - smoked as [name] teen, Marital Status - Married, Alcohol Use - Never, Drug Use - No History, Caffeine Use - Never. Medical History Hematologic/Lymphatic Patient has history of Anemia, Lymphedema Respiratory Patient has history of Sleep Apnea Cardiovascular Patient has history of Arrhythmia - PAF, Congestive Heart Failure - diastolic, Hypertension Endocrine Patient has history of Type II Diabetes - takes no meds, "borderline A1c" Neurologic Patient has history of Neuropathy Oncologic Denies history of Received Chemotherapy, Received Radiation Hospitalization/Surgery History - right and left heart cath. - colonoscopy. - esophagogastroduodenoscopy. - subxyphoid pericardial window. - intraoperative transesophageal echocardiogram. - laparoscopic gastric banding with hiatal hernia repair. - breath tek h pylori. - cholecysectomy. - arthroscopy knee w/ drilling. - lithotripsy. - tonsillectomy. - total knee replacement. - trigger finger repair. - wisdom tooth extraction. Medical [name] Surgical History Notes nd Constitutional Symptoms (General Health) obsesity Hematologic/Lymphatic chronic anticoagulation Cardiovascular pericarditis Gastrointestinal pt says 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 but they could not prove it Genitourinary BPH Neurologic 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, embolic cerebral infarction, CVA Psychiatric depression Objective Constitutional Slightly  hypertensive. no acute distress. Vitals Time Taken: 1:47 PM, Height: 72 in, Weight: 375 lbs, BMI: 50.9, Temperature: 98.5 F, Pulse: 60 bpm, Respiratory Rate: 18 breaths/min, Blood Pressure: 143/84 mmHg. Respiratory Normal work of breathing on supplemental oxygen. Balboa, Netanel [name] ([id number]) [id number] Page 6 of 9 General Notes: 05/02/2022: His left leg wound and left great toe wound have healed. Unfortunately, he was out of his compression and developed [name] new wound on his right leg. It is superficial and actually looks more like [name] scratch than anything else. Edema control on the left is  quite good, well on the right it is rather abysmal. Integumentary (Hair, Skin) Wound #4 status is Open. Original cause of wound was Blister. The date acquired was: 02/13/2021. The wound has been in treatment 11 weeks. The wound is located on the Left,Anterior Lower Leg. The wound measures 0cm length x 0cm width x 0cm depth; 0cm^2 area and 0cm^3 volume. There is no tunneling or undermining noted. There is [name] none present amount of drainage noted. The wound margin is distinct with the outline attached to the wound base. There is no granulation within the wound bed. There is no necrotic tissue within the wound bed. The periwound skin appearance had no abnormalities noted for texture. The periwound skin appearance exhibited: Hemosiderin Staining, Erythema. The periwound skin appearance did not exhibit: Dry/Scaly. The surrounding wound skin color is noted with erythema. Periwound temperature was noted as No Abnormality. Wound #7 status is Open. Original cause of wound was Trauma. The date acquired was: 04/25/2022. The wound has been in treatment 1 weeks. The wound is located on the Left T Great. The wound measures 0cm length x 0cm width x 0cm depth; 0cm^2 area and 0cm^3 volume. There is no tunneling or undermining oe noted. There is [name] none present amount of drainage noted. The wound margin  is distinct with the outline attached to the wound base. There is no granulation within the wound bed. There is no necrotic tissue within the wound bed. The periwound skin appearance had no abnormalities noted for moisture. The periwound skin appearance had no abnormalities noted for color. The periwound skin appearance exhibited: Callus. Periwound temperature was noted as No Abnormality. Wound #8 status is Open. Original cause of wound was Blister. The date acquired was: 05/02/2022. The wound is located on the Right,Medial Lower Leg. The wound measures 1.5cm length x 1cm width x 0.1cm depth; 1.178cm^2 area and 0.118cm^3 volume. There is Fat Layer (Subcutaneous Tissue) exposed. There is no tunneling or undermining noted. There is [name] medium amount of serous drainage noted. The wound margin is distinct with the outline attached to the wound base. There is large (67-100%) red granulation within the wound bed. There is [name] small (1-33%) amount of necrotic tissue within the wound bed including Eschar and Adherent Slough. The periwound skin appearance had no abnormalities noted for texture. The periwound skin appearance had no abnormalities noted for moisture. The periwound skin appearance exhibited: Rubor. The periwound skin appearance did not exhibit: Erythema. Periwound temperature was noted as No Abnormality. Assessment Active Problems ICD-10 Non-pressure chronic ulcer of other part of right lower leg limited to breakdown of skin Unspecified diastolic (congestive) heart failure Chronic respiratory failure with hypoxia Morbid (severe) obesity due to excess calories Lymphedema, not elsewhere classified Procedures Wound #8 Pre-procedure diagnosis of Wound #8 is [name] Lymphedema located on the Right,Medial Lower Leg . There was [name] Haematologist Compression Therapy Procedure by Adline Peals, RN. Post procedure Diagnosis Wound #8: Same as Pre-Procedure Plan Follow-up Appointments: Return Appointment in 1  week. - Dr. Celine Ahr - Room 2 Anesthetic: (In clinic) Topical Lidocaine 4% applied to wound bed Bathing/ Shower/ Hygiene: May shower with protection but do not get wound dressing(s) wet. Protect dressing(s) with water repellant cover (for example, large plastic bag) or [name] cast cover and may then take shower. Edema Control - Lymphedema / SCD / Other: Elevate legs to the level of the heart or above for 30 minutes daily and/or when sitting for 3-4 times [name] day throughout the day. Avoid standing for long  periods of time. Patient to wear own compression stockings every day. - farrow wrap to right leg Exercise regularly The following medication(s) was prescribed: lidocaine topical 4 % cream cream topical was prescribed at facility WOUND #8: - Lower Leg Wound Laterality: Right, Medial Cleanser: Soap and Water 1 x Per Week/10 Days Discharge Instructions: May shower and wash wound with dial antibacterial soap and water prior to dressing change. Cleanser: Wound Cleanser 1 x Per Week/10 Days Discharge Instructions: Cleanse the wound with wound cleanser prior to applying [name] clean dressing using gauze sponges, not tissue or cotton balls. Peri-Wound Care: Sween Lotion (Moisturizing lotion) 1 x Per Week/10 Days Discharge Instructions: Apply moisturizing lotion as directed Prim Dressing: Sorbalgon AG Dressing 2x2 (in/in) 1 x Per Week/10 Days ary Discharge Instructions: Apply to wound bed as instructed Secondary Dressing: Woven Gauze Sponge, Non-Sterile 4x4 in 1 x Per Week/10 Days Discharge Instructions: Apply over primary dressing as directed. Com pression Wrap: Unnaboot w/Calamine, 4x10 (in/yd) 1 x Per Week/10 Days Discharge Instructions: Apply Unnaboot as directed. Finch, Deon [name] ([id number]) [id number] Page 7 of 9 05/02/2022: His left leg wound and left great toe wound have healed. Unfortunately, he was out of his compression and developed [name] new wound on his right leg. It is  superficial and actually looks more like [name] scratch than anything else. Edema control on the left is quite good, well on the right it is rather abysmal. The new wound did not require debridement. We will apply silver alginate and an Unna boot on this leg. He did bring his [place] wrap with him and both he and his wife were instructed in correct application to place this on his left leg. Follow-up in 1 week, at which time I anticipate the new wound should be healed, as it is quite small and only just breaks the skin. Electronic Signature(s) Signed: 05/02/2022 2:37:25 PM By: [name] MD FACS Previous Signature: 05/02/2022 2:34:54 PM Version By: [name] MD FACS Entered By: [name] on 05/02/2022 14:37:25 -------------------------------------------------------------------------------- HxROS Details Patient Name: Date of Service: [name], Szymon [name]. 05/02/2022 2:00 PM Medical Record Number: [id number] Patient Account Number: [id number] Date of Birth/Sex: Treating RN: [birth year] (77 y.o. M) Primary Care Provider: [name] Other Clinician: Referring Provider: Treating Provider/Extender: [name] in Treatment: 11 Information Obtained From Patient Constitutional Symptoms (General Health) Medical History: Past Medical History Notes: obsesity Hematologic/Lymphatic Medical History: Positive for: Anemia; Lymphedema Past Medical History Notes: chronic anticoagulation Respiratory Medical History: Positive for: Sleep Apnea Cardiovascular Medical History: Positive for: Arrhythmia - PAF; Congestive Heart Failure - diastolic; Hypertension Past Medical History Notes: pericarditis Gastrointestinal Medical History: Past Medical History Notes: pt says 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 but they could not prove it Endocrine Medical History: Positive for: Type II Diabetes - takes no meds, "borderline A1c" Time with  diabetes: 2-3 years ago Blood sugar tested every day: No Genitourinary Medical History: Past Medical History NotesKanoa [name], [name] ([id number]) [id number] Page 8 of 9 BPH Neurologic Medical History: Positive for: Neuropathy Past Medical History Notes: 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, embolic cerebral infarction, CVA Oncologic Medical History: Negative for: Received Chemotherapy; Received Radiation Psychiatric Medical History: Past Medical History Notes: depression Immunizations Pneumococcal Vaccine: Received Pneumococcal Vaccination: Yes Received Pneumococcal Vaccination On or After 60th Birthday: Yes Implantable Devices None Hospitalization / Surgery History Type of Hospitalization/Surgery right and left heart cath colonoscopy esophagogastroduodenoscopy subxyphoid pericardial window intraoperative transesophageal echocardiogram laparoscopic gastric banding with hiatal hernia repair  breath tek h pylori cholecysectomy arthroscopy knee w/ drilling lithotripsy tonsillectomy total knee replacement trigger finger repair wisdom tooth extraction Family and Social History Cancer: Yes - Paternal Grandparents; Diabetes: No; Heart Disease: Yes - Maternal Grandparents,Father; Hereditary Spherocytosis: No; Hypertension: No; Kidney Disease: No; Lung Disease: No; Seizures: No; Stroke: No; Thyroid Problems: No; Tuberculosis: No; Former smoker - smoked as [name] teen; Marital Status - Married; Alcohol Use: Never; Drug Use: No History; Caffeine Use: Never; Financial Concerns: No; Food, Clothing or Shelter Needs: No; Support System Lacking: No; Transportation Concerns: No Electronic Signature(s) Signed: 05/02/2022 3:42:54 PM By: [name] MD FACS Entered By: [name] on 05/02/2022 14:32:36 -------------------------------------------------------------------------------- SuperBill Details Patient Name: Date of Service: [name], Waldemar [name].  05/02/2022 Medical Record Number: [id number] Patient Account Number: [id number] Date of Birth/Sex: Treating RN: 1946/02/23 (77 y.o. [name] Primary Care Provider: [name] Other Clinician: Referring Provider: Treating Provider/Extender: [name] in Treatment: 11 Diagnosis Coding Golomb, Sriman [name] ([id number]) [id number] Page 9 of 9 ICD-10 Codes Code Description L97.811 Non-pressure chronic ulcer of other part of right lower leg limited to breakdown of skin I50.30 Unspecified diastolic (congestive) heart failure J96.11 Chronic respiratory failure with hypoxia E66.01 Morbid (severe) obesity due to excess calories I89.0 Lymphedema, not elsewhere classified Facility Procedures : CPT4 Code: [id number] Description: (Facility Use Only) [phone] - [place] [facility] RT LEG ICD-10 Diagnosis Description I89.0 Lymphedema, not elsewhere classified Modifier: Quantity: 1 Physician Procedures : CPT4 Code Description Modifier [id number] - WC PHYS LEVEL 3 - EST PT ICD-10 Diagnosis Description L97.811 Non-pressure chronic ulcer of other part of right lower leg limited to breakdown of skin I50.30 Unspecified diastolic (congestive) heart  failure I89.0 Lymphedema, not elsewhere classified E66.01 Morbid (severe) obesity due to excess calories Quantity: 1 Electronic Signature(s) Signed: 05/02/2022 2:36:54 PM By: [name] MD FACS Entered By: [name] on 05/02/2022 14:36:54

## 2022-05-02 NOTE — Progress Notes (Signed)
[name] ([id number]) [id number] Page 1 of 9 Visit Report for 05/02/2022 Arrival Information Details Patient Name: Date of Service: [name], [name]. 05/02/2022 2:00 PM Medical Record Number: [id number] Patient Account Number: [id number] Date of Birth/Sex: Treating RN: [birth year] (77 y.o. [name] Primary Care [name]: [name] Other Clinician: Referring Ceclia Koker: Treating [name]/Extender: [name] in Treatment: 11 Visit Information History Since Last Visit Added or deleted any medications: No Patient Arrived: [name] Any new allergies or adverse reactions: No Arrival Time: 13:44 Had [name] fall or experienced change in No Accompanied By: wife activities of daily living that may affect Transfer Assistance: Manual risk of falls: Patient Identification Verified: Yes Signs or symptoms of abuse/neglect since last visito No Secondary Verification Process Completed: Yes Hospitalized since last visit: No Patient Requires Transmission-Based Precautions: No Implantable device outside of the clinic excluding No Patient Has Alerts: Yes cellular tissue based products placed in the center Patient Alerts: Patient on Blood Thinner since last visit: Has Dressing in Place as Prescribed: Yes Has Compression in Place as Prescribed: Yes Pain Present Now: No Electronic Signature(s) Signed: 05/02/2022 3:55:15 PM By: Adline Peals Entered By: Adline Peals on 05/02/2022 13:46:42 -------------------------------------------------------------------------------- Compression Therapy Details Patient Name: Date of Service: [name], [name]. 05/02/2022 2:00 PM Medical Record Number: [id number] Patient Account Number: [id number] Date of Birth/Sex: Treating RN: 1946/02/15 (77 y.o. [name] Primary Care Dagon Budai: [name] Other Clinician: Referring Reya Aurich: Treating [name]/Extender: [name] in Treatment: 11 Compression Therapy Performed for Wound Assessment: Wound #8 Right,Medial Lower Leg Performed By: Clinician Adline Peals, RN Compression Type: Rolena Infante Post Procedure Diagnosis Same as Pre-procedure Electronic Signature(s) Signed: 05/02/2022 3:55:15 PM By: Adline Peals Entered By: Adline Peals on 05/02/2022 14:06:39 [name] ([id number]) [id number] Page 2 of 9 -------------------------------------------------------------------------------- Encounter Discharge Information Details Patient Name: Date of Service: [name], [name]. 05/02/2022 2:00 PM Medical Record Number: [id number] Patient Account Number: [id number] Date of Birth/Sex: Treating RN: Jan 29, 1946 (77 y.o. [name] Primary Care [name]: [name] Other Clinician: Referring [name]: Treating [name]/Extender: [name] in Treatment: 11 Encounter Discharge Information Items Discharge Condition: Stable Ambulatory Status: Walker Discharge Destination: Home Transportation: Private Auto Accompanied By: wife Schedule Follow-up Appointment: Yes Clinical Summary of Care: Patient Declined Electronic Signature(s) Signed: 05/02/2022 3:55:15 PM By: Sabas Sous By: Adline Peals on 05/02/2022 14:29:38 -------------------------------------------------------------------------------- Lower Extremity Assessment Details Patient Name: Date of Service: [name], [name]. 05/02/2022 2:00 PM Medical Record Number: [id number] Patient Account Number: [id number] Date of Birth/Sex: Treating RN: 1945-05-11 (77 y.o. [name] Primary Care Dailah Opperman: [name] Other Clinician: Referring [name]: Treating [name]/Extender: [name] in Treatment: 11 Edema Assessment Assessed: [name]: No] [name]: No] [Left: Edema] [Right:  :] Calf Left: Right: Point of Measurement: From Medial Instep 50.5 cm 57.5 cm Ankle Left: Right: Point of Measurement: From Medial Instep 31 cm 33 cm Vascular Assessment Pulses: Dorsalis Pedis Palpable: [Left:Yes] [Right:Yes] Electronic Signature(s) Signed: 05/02/2022 3:55:15 PM By: Adline Peals Entered By: Adline Peals on 05/02/2022 13:54:16 Multi Wound Chart Details -------------------------------------------------------------------------------- [name] ([id number]) [id number] Page 3 of 9 Patient Name: Date of Service: [place]. 05/02/2022 2:00 PM Medical Record Number: [id number] Patient Account Number: [id number] Date of Birth/Sex: Treating RN: [birth year] (77 y.o. M) Primary Care [name]: [name] Other Clinician: Referring [name]: Treating [name]/Extender: [name] in Treatment: 11 Vital Signs Height(in):  72 Pulse(bpm): 60 Weight(lbs): 375 Blood Pressure(mmHg): 143/84 Body Mass Index(BMI): 50.9 Temperature(F): 98.5 Respiratory Rate(breaths/min): 18 Wound Assessments Wound Number: '4 7 8 '$ Photos: Left, Anterior Lower Leg Left T Great oe Right, Medial Lower Leg Wound Location: Blister Trauma Blister Wounding Event: Lymphedema Diabetic Wound/Ulcer of the Lower Lymphedema Primary Etiology: Extremity Anemia, Lymphedema, Sleep Apnea, Anemia, Lymphedema, Sleep Apnea, Anemia, Lymphedema, Sleep Apnea, Comorbid History: Arrhythmia, Congestive Heart Failure, Arrhythmia, Congestive Heart Failure, Arrhythmia, Congestive Heart Failure, Hypertension, Type II Diabetes, Hypertension, Type II Diabetes, Hypertension, Type II Diabetes, Neuropathy Neuropathy Neuropathy 02/13/2021 04/25/2022 05/02/2022 Date Acquired: 11 1 0 Weeks of Treatment: Open Open Open Wound Status: No No No Wound Recurrence: 0x0x0 0x0x0 1.5x1x0.1 Measurements L x W x D (cm) 0 0 1.178 [name] (cm) : rea 0 0  0.118 Volume (cm) : 100.00% 100.00% N/[name] % Reduction in Area: 100.00% 100.00% N/[name] % Reduction in Volume: Full Thickness Without Exposed Grade 1 Full Thickness Without Exposed Classification: Support Structures Support Structures None Present None Present Medium Exudate [name] mount: N/[name] N/[name] Serous Exudate Type: N/[name] N/[name] amber Exudate Color: Distinct, outline attached Distinct, outline attached Distinct, outline attached Wound Margin: None Present (0%) None Present (0%) Large (67-100%) Granulation Amount: N/[name] N/[name] Red Granulation Quality: None Present (0%) None Present (0%) Small (1-33%) Necrotic Amount: N/[name] N/[name] Eschar, Adherent Slough Necrotic Tissue: Fascia: No Fascia: No Fat Layer (Subcutaneous Tissue): Yes Exposed Structures: Fat Layer (Subcutaneous Tissue): No Fat Layer (Subcutaneous Tissue): No Fascia: No Tendon: No Tendon: No Tendon: No Muscle: No Muscle: No Muscle: No Joint: No Joint: No Joint: No Bone: No Bone: No Bone: No Large (67-100%) Large (67-100%) Small (1-33%) Epithelialization: No Abnormalities Noted Callus: Yes No Abnormalities Noted Periwound Skin Texture: Dry/Scaly: No No Abnormalities Noted No Abnormalities Noted Periwound Skin Moisture: Erythema: Yes No Abnormalities Noted Rubor: Yes Periwound Skin Color: Hemosiderin Staining: Yes Erythema: No No Abnormality No Abnormality No Abnormality Temperature: N/[name] N/[name] Compression Therapy Procedures Performed: Treatment Notes Electronic Signature(s) Signed: 05/02/2022 2:29:23 PM By: [name] MD FACS Entered By: [name] on 05/02/2022 14:29:23 Deininger, Cuyler [name] ([id number]) [id number] Page 4 of 9 -------------------------------------------------------------------------------- Multi-Disciplinary Care Plan Details Patient Name: Date of Service: [name], [name]. 05/02/2022 2:00 PM Medical Record Number: [id number] Patient Account Number: [id number] Date of  Birth/Sex: Treating RN: [birth year] (77 y.o. [name] Primary Care Jeanita Carneiro: [name] Other Clinician: Referring Jalilah Wiltsie: Treating Jayland Null/Extender: [name] in Treatment: 11 Active Inactive Necrotic Tissue Nursing Diagnoses: Impaired tissue integrity related to necrotic/devitalized tissue Knowledge deficit related to management of necrotic/devitalized tissue Goals: Necrotic/devitalized tissue will be minimized in the wound bed Date Initiated: 02/13/2022 Target Resolution Date: 06/02/2022 Goal Status: Active Patient/caregiver will verbalize understanding of reason and process for debridement of necrotic tissue Date Initiated: 02/13/2022 Target Resolution Date: 06/02/2022 Goal Status: Active Interventions: Assess patient pain level pre-, during and post procedure and prior to discharge Provide education on necrotic tissue and debridement process Treatment Activities: Apply topical anesthetic as ordered : 02/13/2022 Notes: Wound/Skin Impairment Nursing Diagnoses: Impaired tissue integrity Knowledge deficit related to ulceration/compromised skin integrity Goals: Patient/caregiver will verbalize understanding of skin care regimen Date Initiated: 02/13/2022 Target Resolution Date: 06/30/2022 Goal Status: Active Interventions: Assess ulceration(s) every visit Treatment Activities: Skin care regimen initiated : 02/13/2022 Topical wound management initiated : 02/13/2022 Notes: Electronic Signature(s) Signed: 05/02/2022 3:55:15 PM By: Adline Peals Entered By: Adline Peals on 05/02/2022 14:09:22 Pain Assessment Details -------------------------------------------------------------------------------- [name] ([id number]) [id number] Page 5 of 9 Patient Name:  Date of Service: [name], [name]. 05/02/2022 2:00 PM Medical Record Number: [id number] Patient Account Number: [id number] Date of  Birth/Sex: Treating RN: May 01, 1945 (77 y.o. [name] Primary Care Aaronmichael Brumbaugh: [name] Other Clinician: Referring Debarah Mccumbers: Treating Maisen Klingler/Extender: [name] in Treatment: 11 Active Problems Location of Pain Severity and Description of Pain Patient Has Paino No Site Locations Rate the pain. Current Pain Level: 0 Pain Management and Medication Current Pain Management: Electronic Signature(s) Signed: 05/02/2022 3:55:15 PM By: Adline Peals Entered By: Adline Peals on 05/02/2022 13:47:00 -------------------------------------------------------------------------------- Patient/Caregiver Education Details Patient Name: Date of Service: [name], Curlee [name]. 1/30/2024andnbsp2:00 PM Medical Record Number: [id number] Patient Account Number: [id number] Date of Birth/Gender: Treating RN: 1945-04-30 (77 y.o. [name] Primary Care Physician: [name] Other Clinician: Referring Physician: Treating Physician/Extender: [name] in Treatment: 11 Education Assessment Education Provided To: Patient Education Topics Provided Wound/Skin Impairment: Methods: Explain/Verbal Responses: Reinforcements needed, State content correctly [employer]) Signed: 05/02/2022 3:55:15 PM By: Adline Peals Entered By: Adline Peals on 05/02/2022 14:10:06 [name], [name] ([id number]) [id number] Page 6 of 9 -------------------------------------------------------------------------------- Wound Assessment Details Patient Name: Date of Service: [name], [name]. 05/02/2022 2:00 PM Medical Record Number: [id number] Patient Account Number: [id number] Date of Birth/Sex: Treating RN: 13-Nov-1945 (77 y.o. [name] Primary Care Quina Wilbourne: [name] Other Clinician: Referring Kamylah Manzo: Treating Montzerrat Brunell/Extender: [name] in  Treatment: 11 Wound Status Wound Number: 4 Primary Lymphedema Etiology: Wound Location: Left, Anterior Lower Leg Wound Open Wounding Event: Blister Status: Date Acquired: 02/13/2021 Comorbid Anemia, Lymphedema, Sleep Apnea, Arrhythmia, Congestive Heart Weeks Of Treatment: 11 History: Failure, Hypertension, Type II Diabetes, Neuropathy Clustered Wound: No Photos Wound Measurements Length: (cm) Width: (cm) Depth: (cm) Area: (cm) Volume: (cm) 0 % Reduction in Area: 100% 0 % Reduction in Volume: 100% 0 Epithelialization: Large (67-100%) 0 Tunneling: No 0 Undermining: No Wound Description Classification: Full Thickness Without Exposed Support Structures Wound Margin: Distinct, outline attached Exudate Amount: None Present Foul Odor After Cleansing: No Slough/Fibrino No Wound Bed Granulation Amount: None Present (0%) Exposed Structure Necrotic Amount: None Present (0%) Fascia Exposed: No Fat Layer (Subcutaneous Tissue) Exposed: No Tendon Exposed: No Muscle Exposed: No Joint Exposed: No Bone Exposed: No Periwound Skin Texture Texture Color No Abnormalities Noted: Yes No Abnormalities Noted: No Erythema: Yes Moisture Hemosiderin Staining: Yes No Abnormalities Noted: No Dry / Scaly: No Temperature / Pain Temperature: No Abnormality Electronic Signature(s) Signed: 05/02/2022 3:55:15 PM By: Adline Peals Entered By: Adline Peals on 05/02/2022 13:58:26 [name], [name] ([id number]) [id number] Page 7 of 9 -------------------------------------------------------------------------------- Wound Assessment Details Patient Name: Date of Service: [name], [name] [name]. 05/02/2022 2:00 PM Medical Record Number: [id number] Patient Account Number: [id number] Date of Birth/Sex: Treating RN: Oct 28, 1945 (77 y.o. [name] Primary Care Jalayah Gutridge: [name] Other Clinician: Referring Makeshia Seat: Treating Shireen Rayburn/Extender: [name] in Treatment: 11 Wound Status Wound Number: 7 Primary Diabetic Wound/Ulcer of the Lower Extremity Etiology: Wound Location: Left T Great oe Wound Open Wounding Event: Trauma Status: Date Acquired: 04/25/2022 Comorbid Anemia, Lymphedema, Sleep Apnea, Arrhythmia, Congestive Heart Weeks Of Treatment: 1 History: Failure, Hypertension, Type II Diabetes, Neuropathy Clustered Wound: No Photos Wound Measurements Length: (cm) Width: (cm) Depth: (cm) Area: (cm) Volume: (cm) 0 % Reduction in Area: 100% 0 % Reduction in Volume: 100% 0 Epithelialization: Large (67-100%) 0 Tunneling: No 0 Undermining: No Wound Description Classification: Grade 1 Wound Margin: Distinct, outline attached Exudate  Amount: None Present Foul Odor After Cleansing: No Slough/Fibrino No Wound Bed Granulation Amount: None Present (0%) Exposed Structure Necrotic Amount: None Present (0%) Fascia Exposed: No Fat Layer (Subcutaneous Tissue) Exposed: No Tendon Exposed: No Muscle Exposed: No Joint Exposed: No Bone Exposed: No Periwound Skin Texture Texture Color No Abnormalities Noted: No No Abnormalities Noted: Yes Callus: Yes Temperature / Pain Temperature: No Abnormality Moisture No Abnormalities Noted: Yes Electronic Signature(s) Signed: 05/02/2022 3:55:15 PM By: Adline Peals Entered By: Adline Peals on 05/02/2022 13:58:43 Tornow, Edu [name] ([id number]) [id number] Page 8 of 9 -------------------------------------------------------------------------------- Wound Assessment Details Patient Name: Date of Service: [name], Colton [name]. 05/02/2022 2:00 PM Medical Record Number: [id number] Patient Account Number: [id number] Date of Birth/Sex: Treating RN: 20-Jul-1945 (77 y.o. [name] Primary Care Joslynn Jamroz: [name] Other Clinician: Referring Roshana Shuffield: Treating Socrates Cahoon/Extender: [name] in  Treatment: 11 Wound Status Wound Number: 8 Primary Lymphedema Etiology: Wound Location: Right, Medial Lower Leg Wound Open Wounding Event: Blister Status: Date Acquired: 05/02/2022 Comorbid Anemia, Lymphedema, Sleep Apnea, Arrhythmia, Congestive Heart Weeks Of Treatment: 0 History: Failure, Hypertension, Type II Diabetes, Neuropathy Clustered Wound: No Photos Wound Measurements Length: (cm) 1.5 Width: (cm) 1 Depth: (cm) 0.1 Area: (cm) 1.178 Volume: (cm) 0.118 % Reduction in Area: % Reduction in Volume: Epithelialization: Small (1-33%) Tunneling: No Undermining: No Wound Description Classification: Full Thickness Without Exposed Suppor Wound Margin: Distinct, outline attached Exudate Amount: Medium Exudate Type: Serous Exudate Color: amber t Structures Foul Odor After Cleansing: No Slough/Fibrino Yes Wound Bed Granulation Amount: Large (67-100%) Exposed Structure Granulation Quality: Red Fascia Exposed: No Necrotic Amount: Small (1-33%) Fat Layer (Subcutaneous Tissue) Exposed: Yes Necrotic Quality: Eschar, Adherent Slough Tendon Exposed: No Muscle Exposed: No Joint Exposed: No Bone Exposed: No Periwound Skin Texture Texture Color No Abnormalities Noted: Yes No Abnormalities Noted: No Erythema: No Moisture Rubor: Yes No Abnormalities Noted: Yes Temperature / Pain Temperature: No Abnormality Treatment Notes Wound #8 (Lower Leg) Wound Laterality: Right, Medial Cleanser Sovine, Nazeer [name] ([id number]) [id number] Page 9 of 9 Soap and Water Discharge Instruction: May shower and wash wound with dial antibacterial soap and water prior to dressing change. Wound Cleanser Discharge Instruction: Cleanse the wound with wound cleanser prior to applying [name] clean dressing using gauze sponges, not tissue or cotton balls. Peri-Wound Care Sween Lotion (Moisturizing lotion) Discharge Instruction: Apply moisturizing lotion as  directed Topical Primary Dressing Sorbalgon AG Dressing 2x2 (in/in) Discharge Instruction: Apply to wound bed as instructed Secondary Dressing Woven Gauze Sponge, Non-Sterile 4x4 in Discharge Instruction: Apply over primary dressing as directed. Secured With Compression Wrap Unnaboot w/Calamine, 4x10 (in/yd) Discharge Instruction: Apply Unnaboot as directed. Compression Stockings Add-Ons Electronic Signature(s) Signed: 05/02/2022 3:55:15 PM By: Adline Peals Entered By: Adline Peals on 05/02/2022 13:59:07 -------------------------------------------------------------------------------- Vitals Details Patient Name: Date of Service: [name] CKMA N, Averi [name]. 05/02/2022 2:00 PM Medical Record Number: [id number] Patient Account Number: [id number] Date of Birth/Sex: Treating RN: 1946-01-07 (77 y.o. [name] Primary Care Lataya Varnell: [name] Other Clinician: Referring Elsbeth Yearick: Treating Kratos Ruscitti/Extender: [name] in Treatment: 11 Vital Signs Time Taken: 13:47 Temperature (F): 98.5 Height (in): 72 Pulse (bpm): 60 Weight (lbs): 375 Respiratory Rate (breaths/min): 18 Body Mass Index (BMI): 50.9 Blood Pressure (mmHg): 143/84 Reference Range: 80 - 120 mg / dl Electronic Signature(s) Signed: 05/02/2022 3:55:15 PM By: Adline Peals Entered By: Adline Peals on 05/02/2022 13:49:47

## 2022-05-09 ENCOUNTER — Other Ambulatory Visit: Payer: Self-pay | Admitting: Internal Medicine

## 2022-05-09 DIAGNOSIS — I480 Paroxysmal atrial fibrillation: Secondary | ICD-10-CM

## 2022-05-11 ENCOUNTER — Encounter (HOSPITAL_BASED_OUTPATIENT_CLINIC_OR_DEPARTMENT_OTHER): Payer: Medicare Other | Admitting: General Surgery

## 2022-05-17 ENCOUNTER — Encounter (HOSPITAL_BASED_OUTPATIENT_CLINIC_OR_DEPARTMENT_OTHER): Payer: Medicare Other | Attending: General Surgery | Admitting: General Surgery

## 2022-05-17 DIAGNOSIS — I5032 Chronic diastolic (congestive) heart failure: Secondary | ICD-10-CM | POA: Diagnosis not present

## 2022-05-17 DIAGNOSIS — I5030 Unspecified diastolic (congestive) heart failure: Secondary | ICD-10-CM | POA: Insufficient documentation

## 2022-05-17 DIAGNOSIS — E11622 Type 2 diabetes mellitus with other skin ulcer: Secondary | ICD-10-CM | POA: Insufficient documentation

## 2022-05-17 DIAGNOSIS — Z6841 Body Mass Index (BMI) 40.0 and over, adult: Secondary | ICD-10-CM | POA: Insufficient documentation

## 2022-05-17 DIAGNOSIS — I890 Lymphedema, not elsewhere classified: Secondary | ICD-10-CM | POA: Insufficient documentation

## 2022-05-17 DIAGNOSIS — L97822 Non-pressure chronic ulcer of other part of left lower leg with fat layer exposed: Secondary | ICD-10-CM | POA: Insufficient documentation

## 2022-05-17 DIAGNOSIS — J9611 Chronic respiratory failure with hypoxia: Secondary | ICD-10-CM | POA: Diagnosis not present

## 2022-05-17 DIAGNOSIS — L97812 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d: Secondary | ICD-10-CM | POA: Insufficient documentation

## 2022-05-19 NOTE — Progress Notes (Signed)
[name], [name] ([id number] Page 1 of 10 Visit Report for 05/17/2022 Chief Complaint Document Details Patient Name: Date of Service: [name], [place] [name]. 05/17/2022 11:00 [name] Medical Record Number: [id number] Patient Account Number: [id number] Date of Birth/Sex: Treating RN: [birth year] (77 y.o. M) Primary Care Provider: [name] Other Clinician: Referring Provider: Treating Provider/Extender: [name] in Treatment: 13 Information Obtained from: Patient Chief Complaint Patients presents for treatment of open ulcers secondary to lymphedema and diabetes Electronic Signature(s) Signed: 05/17/2022 11:52:43 AM By: [name] MD FACS Entered By: [name] on 05/17/2022 11:52:43 -------------------------------------------------------------------------------- Debridement Details Patient Name: Date of Service: [name] [name], [name]. 05/17/2022 11:00 [name] Medical Record Number: [id number] Patient Account Number: [id number] Date of Birth/Sex: Treating RN: Oct 13, 1945 (77 y.o. Collene Gobble Primary Care Provider: [name] Other Clinician: Referring Provider: Treating Provider/Extender: [name] in Treatment: 13 Debridement Performed for Assessment: Wound #9 Left,Anterior Lower Leg Performed By: Physician [name], MD Debridement Type: Debridement Level of Consciousness (Pre-procedure): Awake and Alert Pre-procedure Verification/Time Out Yes - 11:55 Taken: Start Time: 11:55 Pain Control: Lidocaine 4% T opical Solution T Area Debrided (L x W): otal 8.6 (cm) x 5.3 (cm) = 45.58 (cm) Tissue and other material debrided: Non-Viable, Other: dead skin Level: Non-Viable Tissue Debridement Description: Selective/Open Wound Instrument: Forceps, Scissors Bleeding: Minimum Hemostasis Achieved: Pressure End Time: 11:56 Procedural Pain: 0 Post Procedural Pain: 0 Response  to Treatment: Procedure was tolerated well Level of Consciousness (Post- Awake and Alert procedure): Post Debridement Measurements of Total Wound Length: (cm) 8.6 Width: (cm) 5.3 Depth: (cm) 0.1 Volume: (cm) 3.58 Character of Wound/Ulcer Post Debridement: Improved Post Procedure Diagnosis [name], [name] ([id number] Page 2 of 10 Same as Pre-procedure Notes Scribed for Dr. Celine Ahr by J.Scotton Electronic Signature(s) Signed: 05/17/2022 12:11:31 PM By: [name] MD FACS Signed: 05/17/2022 5:28:16 PM By: Dellie Catholic RN Entered By: Dellie Catholic on 05/17/2022 12:01:01 -------------------------------------------------------------------------------- HPI Details Patient Name: Date of Service: [name] CKMA N, [name]. 05/17/2022 11:00 [name] Medical Record Number: [id number] Patient Account Number: [id number] Date of Birth/Sex: Treating RN: 1946-02-21 (77 y.o. M) Primary Care Provider: [name] Other Clinician: Referring Provider: Treating Provider/Extender: [name] in Treatment: 13 History of Present Illness HPI Description: ADMISSION 02/13/2022 This is [name] 77 year old morbidly obese type II diabetic (last hemoglobin A1c 6.6%) who presented to his primary care provider's office on November 7 with an ulcer on the top of his foot. He said it had been there for about [name] week. He is insensate in his feet and his wife had been caring for it. His PCP took [name] swab from the wound which grew back strep. [name] was prescribed and he was referred to the wound care center for further evaluation and management. In addition to diabetes, he also has pulmonary hypertension and diastolic congestive heart failure. 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 He has been seen at the voice and swallowing disorders clinic in [place] and no further intervention has been felt  necessary. On exam, he has severe, at least stage II, lymphedema. In addition to the dorsal foot wound, he has ulcers on both lower legs. He has [name] left anterior lower leg, [name] right lateral lower leg, and [name] right anterior lower leg wound. ABIs in clinic were noncompressible. 02/22/2022: The right lateral leg wound has closed. The other wounds are about the  same size to slightly smaller. All of them have [name] thick layer of rubbery slough on the surface. Edema control is still suboptimal. 03/02/2022: The left anterior leg wound has deteriorated. His leg is more swollen and red. There is thick slough accumulation on the surface. The right dorsal foot wound is smaller, but also has substantial slough buildup. 03/09/2022: The PCR culture that I took last week was positive for Pseudomonas. Levofloxacin was prescribed and he has [name] few days left of this. His leg looks better and the wound is cleaner. The right dorsal foot wound has [name] very thick layer of slough on the surface. Both wounds are smaller. 03/30/2022: All of the wounds are smaller today and very superficial. The dorsal foot wound on the right is nearly closed. There is just some slough and eschar present. 04/10/2022: The right dorsal foot wound has healed. Unfortunately, his right-sided wrap slid and above this, he developed significant swelling and now has blisters and several new open wounds with slough accumulation. The left anterior leg wound is small with slough and eschar buildup. 04/25/2022: The wounds on his right leg have healed. The wound on his left leg is down to [name] pinhole underneath some eschar. He attempted to cut his own nails and nicked his great toe, creating [name] new wound. It is fairly superficial. 05/02/2022: His left leg wound and left great toe wound have healed. Unfortunately, he was out of his compression and developed [name] new wound on his right leg. It is superficial and actually looks more like [name] scratch than anything else. Edema  control on the left is quite good, while on the right it is rather abysmal. 05/17/2022: He missed his appointment last week due to transportation issues. He has [name] new wound on his left anterior lower leg. Despite taking his diuretics and wearing his compression wraps, his edema control is inadequate. Electronic Signature(s) Signed: 05/17/2022 12:01:52 PM By: [name] MD FACS Entered By: [name] on 05/17/2022 12:01:52 -------------------------------------------------------------------------------- Physical Exam Details Patient Name: Date of Service: [name] CKMA N, [place] [name]. 05/17/2022 11:00 [name] KESHON WORRELL, Tyeler [name] ([id number]) [phone].pdf Page 3 of 10 Medical Record Number: [id number] Patient Account Number: [id number] Date of Birth/Sex: Treating RN: [birth year] (77 y.o. M) Primary Care Provider: [name] Other Clinician: Referring Provider: Treating Provider/Extender: [name] in Treatment: 13 Constitutional . Slightly bradycardic. . . no acute distress. Respiratory Normal work of breathing on supplemental oxygen. Notes 05/17/2022: He has [name] new wound on his left anterior lower leg. Despite taking his diuretics and wearing his compression wraps, his edema control is inadequate. Electronic Signature(s) Signed: 05/17/2022 12:08:28 PM By: [name] MD FACS Entered By: [name] on 05/17/2022 12:08:28 -------------------------------------------------------------------------------- Physician Orders Details Patient Name: Date of Service: [name] CKMA N, [place] [name]. 05/17/2022 11:00 [name] Medical Record Number: [id number] Patient Account Number: [id number] Date of Birth/Sex: Treating RN: 1945/12/06 (77 y.o. Collene Gobble Primary Care Provider: [name] Other Clinician: Referring Provider: Treating Provider/Extender: [name] in Treatment: 60 Verbal / Phone Orders: No Diagnosis  Coding ICD-10 Coding Code Description L97.811 Non-pressure chronic ulcer of other part of right lower leg limited to breakdown of skin L97.829 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ity I50.30 Unspecified diastolic (congestive) heart failure J96.11 Chronic respiratory failure with hypoxia E66.01 Morbid (severe) obesity due to excess calories I89.0 Lymphedema, not elsewhere classified Follow-up Appointments ppointment in 1 week. - Dr. Celine Ahr - Room 2  Return [name] Anesthetic (In clinic) Topical Lidocaine 4% applied to wound bed Bathing/ Shower/ Hygiene May shower with protection but do not get wound dressing(s) wet. Protect dressing(s) with water repellant cover (for example, large plastic bag) or [name] cast cover and may then take shower. Edema Control - Lymphedema / SCD / Other Elevate legs to the level of the heart or above for 30 minutes daily and/or when sitting for 3-4 times [name] day throughout the day. Avoid standing for long periods of time. Patient to wear own compression stockings every day. - farrow wrap to right leg when not wearing compression wraps Exercise regularly Wound Treatment Wound #8 - Lower Leg Wound Laterality: Right, Medial Cleanser: Soap and Water 1 x Per Week/10 Days Discharge Instructions: May shower and wash wound with dial antibacterial soap and water prior to dressing change. Cleanser: Wound Cleanser 1 x Per Week/10 Days Discharge Instructions: Cleanse the wound with wound cleanser prior to applying [name] clean dressing using gauze sponges, not tissue or cotton balls. Peri-Wound Care: Sween Lotion (Moisturizing lotion) 1 x Per Week/10 Days Lemonds, [place] [name] ([id number]) [phone].pdf Page 4 of 10 Discharge Instructions: Apply moisturizing lotion as directed Prim Dressing: Sorbalgon AG Dressing 2x2 (in/in) 1 x Per Week/10 Days ary Discharge Instructions: Apply to wound bed as instructed Secondary  Dressing: Woven Gauze Sponge, Non-Sterile 4x4 in 1 x Per Week/10 Days Discharge Instructions: Apply over primary dressing as directed. Compression Wrap: FourPress (4 layer compression wrap) 1 x Per Week/10 Days Discharge Instructions: Apply four layer compression as directed. May also use Urgo K2 compression system as alternative. Wound #9 - Lower Leg Wound Laterality: Left, Anterior Cleanser: Soap and Water 1 x Per Week/10 Days Discharge Instructions: May shower and wash wound with dial antibacterial soap and water prior to dressing change. Cleanser: Wound Cleanser 1 x Per Week/10 Days Discharge Instructions: Cleanse the wound with wound cleanser prior to applying [name] clean dressing using gauze sponges, not tissue or cotton balls. Peri-Wound Care: Sween Lotion (Moisturizing lotion) 1 x Per Week/10 Days Discharge Instructions: Apply moisturizing lotion as directed Prim Dressing: Sorbalgon AG Dressing 2x2 (in/in) 1 x Per Week/10 Days ary Discharge Instructions: Apply to wound bed as instructed Secondary Dressing: Woven Gauze Sponge, Non-Sterile 4x4 in 1 x Per Week/10 Days Discharge Instructions: Apply over primary dressing as directed. Compression Wrap: FourPress (4 layer compression wrap) 1 x Per Week/10 Days Discharge Instructions: Apply four layer compression as directed. May also use Urgo K2 compression system as alternative. Electronic Signature(s) Signed: 05/17/2022 12:11:31 PM By: [name] MD FACS Entered By: [name] on 05/17/2022 12:08:43 -------------------------------------------------------------------------------- Problem List Details Patient Name: Date of Service: [name] CKMA N, Abdulkarim [name]. 05/17/2022 11:00 [name] Medical Record Number: [id number] Patient Account Number: [id number] Date of Birth/Sex: Treating RN: 1945-04-09 (77 y.o. M) Primary Care Provider: [name] Other Clinician: Referring Provider: Treating Provider/Extender: [name] in Treatment: 13 Active Problems ICD-10 Encounter Code Description Active Date MDM Diagnosis L97.811 Non-pressure chronic ulcer of other part of right lower leg limited to breakdown 05/02/2022 No Yes of skin L97.829 Non-pressure chronic ulcer of other part of left lower leg with unspecified 05/17/2022 No Yes severity I50.30 Unspecified diastolic (congestive) heart failure 02/13/2022 No Yes J96.11 Chronic respiratory failure with hypoxia 02/13/2022 No Yes Nunnelley, [place] [name] ([id number]) [phone].pdf Page 5 of 10 E66.01 Morbid (severe) obesity due to excess calories 02/13/2022 No Yes I89.0 Lymphedema, not elsewhere classified 02/13/2022 No Yes Inactive Problems ICD-10 Code Description Active Date Inactive  Date L97.512 Non-pressure chronic ulcer of other part of right foot with fat layer exposed 02/13/2022 02/13/2022 Resolved Problems ICD-10 Code Description Active Date Resolved Date L97.812 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d 02/13/2022 02/13/2022 [id number] Non-pressure chronic ulcer of other part of left lower leg with fat layer exposed 02/13/2022 02/13/2022 L97.511 Non-pressure chronic ulcer of other part of right foot limited to breakdown of skin 04/25/2022 04/25/2022 L03.115 Cellulitis of right lower limb 02/13/2022 02/13/2022 [id number] Type 2 diabetes mellitus with foot ulcer 02/13/2022 02/13/2022 Electronic Signature(s) Signed: 05/17/2022 11:52:27 AM By: [name] MD FACS Entered By: [name] on 05/17/2022 11:52:27 -------------------------------------------------------------------------------- Progress Note Details Patient Name: Date of Service: [place], [name]. 05/17/2022 11:00 [name] Medical Record Number: [id number] Patient Account Number: [id number] Date of Birth/Sex: Treating RN: [birth year] (77 y.o. M) Primary Care Provider: [name] Other Clinician: Referring  Provider: Treating Provider/Extender: [name] in Treatment: 29 Subjective Chief Complaint Information obtained from Patient Patients presents for treatment of open ulcers secondary to lymphedema and diabetes History of Present Illness (HPI) ADMISSION 02/13/2022 This is [name] 77 year old morbidly obese type II diabetic (last hemoglobin A1c 6.6%) who presented to his primary care provider's office on November 7 with an ulcer on the top of his foot. He said it had been there for about [name] week. He is insensate in his feet and his wife had been caring for it. His PCP took [name] swab from the wound which grew back strep. [name] was prescribed and he was referred to the wound care center for further evaluation and management. In addition to diabetes, he also has pulmonary hypertension and diastolic congestive heart failure. 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 He has been seen at the voice and swallowing disorders clinic in [place] and no further intervention has been felt necessary. On exam, he has severe, at least stage II, lymphedema. In addition to the dorsal foot wound, he has ulcers on both lower legs. He has [name] left anterior lower leg, Goren, [place] [name] ([id number]) [phone].pdf Page 6 of 10 [name] right lateral lower leg, and [name] right anterior lower leg wound. ABIs in clinic were noncompressible. 02/22/2022: The right lateral leg wound has closed. The other wounds are about the same size to slightly smaller. All of them have [name] thick layer of rubbery slough on the surface. Edema control is still suboptimal. 03/02/2022: The left anterior leg wound has deteriorated. His leg is more swollen and red. There is thick slough accumulation on the surface. The right dorsal foot wound is smaller, but also has substantial slough buildup. 03/09/2022: The PCR culture that I took last week was positive for  Pseudomonas. Levofloxacin was prescribed and he has [name] few days left of this. His leg looks better and the wound is cleaner. The right dorsal foot wound has [name] very thick layer of slough on the surface. Both wounds are smaller. 03/30/2022: All of the wounds are smaller today and very superficial. The dorsal foot wound on the right is nearly closed. There is just some slough and eschar present. 04/10/2022: The right dorsal foot wound has healed. Unfortunately, his right-sided wrap slid and above this, he developed significant swelling and now has blisters and several new open wounds with slough accumulation. The left anterior leg wound is small with slough and eschar buildup. 04/25/2022: The wounds on his right leg have healed. The  wound on his left leg is down to [name] pinhole underneath some eschar. He attempted to cut his own nails and nicked his great toe, creating [name] new wound. It is fairly superficial. 05/02/2022: His left leg wound and left great toe wound have healed. Unfortunately, he was out of his compression and developed [name] new wound on his right leg. It is superficial and actually looks more like [name] scratch than anything else. Edema control on the left is quite good, while on the right it is rather abysmal. 05/17/2022: He missed his appointment last week due to transportation issues. He has [name] new wound on his left anterior lower leg. Despite taking his diuretics and wearing his compression wraps, his edema control is inadequate. Patient History Information obtained from Patient. Family History Cancer - Paternal Grandparents, Heart Disease - Maternal Grandparents,Father, No family history of Diabetes, Hereditary Spherocytosis, Hypertension, Kidney Disease, Lung Disease, Seizures, Stroke, Thyroid Problems, Tuberculosis. Social History Former smoker - smoked as [name] teen, Marital Status - Married, Alcohol Use - Never, Drug Use - No History, Caffeine Use - Never. Medical  History Hematologic/Lymphatic Patient has history of Anemia, Lymphedema Respiratory Patient has history of Sleep Apnea Cardiovascular Patient has history of Arrhythmia - PAF, Congestive Heart Failure - diastolic, Hypertension Endocrine Patient has history of Type II Diabetes - takes no meds, "borderline A1c" Neurologic Patient has history of Neuropathy Oncologic Denies history of Received Chemotherapy, Received Radiation Hospitalization/Surgery History - right and left heart cath. - colonoscopy. - esophagogastroduodenoscopy. - subxyphoid pericardial window. - intraoperative transesophageal echocardiogram. - laparoscopic gastric banding with hiatal hernia repair. - breath tek h pylori. - cholecysectomy. - arthroscopy knee w/ drilling. - lithotripsy. - tonsillectomy. - total knee replacement. - trigger finger repair. - wisdom tooth extraction. Medical [name] Surgical History Notes nd Constitutional Symptoms (General Health) obsesity Hematologic/Lymphatic chronic anticoagulation Cardiovascular pericarditis Gastrointestinal pt says 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 but they could not prove it Genitourinary BPH Neurologic 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, embolic cerebral infarction, CVA Psychiatric depression Objective Constitutional Slightly bradycardic. no acute distress. Vitals Time Taken: 11:14 AM, Height: 72 in, Weight: 375 lbs, BMI: 50.9, Temperature: 98.3 F, Pulse: 58 bpm, Respiratory Rate: 18 breaths/min, Blood Pressure: 126/51 mmHg. Respiratory Widrig, Corbett [name] ([id number]) [id number].pdf Page 7 of 10 Normal work of breathing on supplemental oxygen. General Notes: 05/17/2022: He has [name] new wound on his left anterior lower leg. Despite taking his diuretics and wearing his compression wraps, his edema control is inadequate. Integumentary (Hair, Skin) Wound #8 status is Open. Original cause of wound was Blister. The date acquired was:  05/02/2022. The wound has been in treatment 2 weeks. The wound is located on the Right,Medial Lower Leg. The wound measures 1.4cm length x 0.9cm width x 0.1cm depth; 0.99cm^2 area and 0.099cm^3 volume. There is Fat Layer (Subcutaneous Tissue) exposed. There is no tunneling or undermining noted. There is [name] medium amount of serous drainage noted. The wound margin is distinct with the outline attached to the wound base. There is large (67-100%) red granulation within the wound bed. There is [name] small (1-33%) amount of necrotic tissue within the wound bed including Adherent Slough. The periwound skin appearance had no abnormalities noted for texture. The periwound skin appearance had no abnormalities noted for moisture. The periwound skin appearance exhibited: Rubor. The periwound skin appearance did not exhibit: Erythema. Periwound temperature was noted as No Abnormality. Wound #9 status is Open. Original cause of wound was Gradually Appeared. The date acquired was: 05/13/2022.  The wound is located on the Left,Anterior Lower Leg. The wound measures 8.6cm length x 5.3cm width x 0.1cm depth; 35.798cm^2 area and 3.58cm^3 volume. There is no tunneling or undermining noted. There is [name] medium amount of serosanguineous drainage noted. There is medium (34-66%) red, hyper - granulation within the wound bed. There is [name] medium (34- 66%) amount of necrotic tissue within the wound bed including Eschar and Adherent Slough. The periwound skin appearance had no abnormalities noted for moisture. The periwound skin appearance exhibited: Scarring, Hemosiderin Staining. The periwound skin appearance did not exhibit: Callus, Crepitus, Excoriation, Induration, Rash, Atrophie Blanche, Cyanosis, Ecchymosis, Mottled, Pallor, Rubor, Erythema. Periwound temperature was noted as No Abnormality. Assessment Active Problems ICD-10 Non-pressure chronic ulcer of other part of right lower leg limited to breakdown of skin Non-pressure  chronic ulcer of other part of left lower leg with unspecified severity Unspecified diastolic (congestive) heart failure Chronic respiratory failure with hypoxia Morbid (severe) obesity due to excess calories Lymphedema, not elsewhere classified Procedures Wound #9 Pre-procedure diagnosis of Wound #9 is [name] Lymphedema located on the Left,Anterior Lower Leg . There was [name] Selective/Open Wound Non-Viable Tissue Debridement with [name] total area of 45.58 sq cm performed by [name], MD. With the following instrument(s): Forceps, and Scissors to remove Non- Viable tissue/material. Material removed includes Other: dead skin after achieving pain control using Lidocaine 4% T opical Solution. No specimens were taken. [name] time out was conducted at 11:55, prior to the start of the procedure. [name] Minimum amount of bleeding was controlled with Pressure. The procedure was tolerated well with [name] pain level of 0 throughout and [name] pain level of 0 following the procedure. Post Debridement Measurements: 8.6cm length x 5.3cm width x 0.1cm depth; 3.58cm^3 volume. Character of Wound/Ulcer Post Debridement is improved. Post procedure Diagnosis Wound #9: Same as Pre-Procedure General Notes: Scribed for Dr. Celine Ahr by J.Scotton. Pre-procedure diagnosis of Wound #9 is [name] Lymphedema located on the Left,Anterior Lower Leg . There was [name] Four Layer Compression Therapy Procedure by Dellie Catholic, RN. Post procedure Diagnosis Wound #9: Same as Pre-Procedure Wound #8 Pre-procedure diagnosis of Wound #8 is [name] Lymphedema located on the Right,Medial Lower Leg . There was [name] Four Layer Compression Therapy Procedure by Dellie Catholic, RN. Post procedure Diagnosis Wound #8: Same as Pre-Procedure Plan Follow-up Appointments: Return Appointment in 1 week. - Dr. Celine Ahr - Room 2 Anesthetic: (In clinic) Topical Lidocaine 4% applied to wound bed Bathing/ Shower/ Hygiene: May shower with protection but do not get wound dressing(s) wet.  Protect dressing(s) with water repellant cover (for example, large plastic bag) or [name] cast cover and may then take shower. Edema Control - Lymphedema / SCD / Other: Elevate legs to the level of the heart or above for 30 minutes daily and/or when sitting for 3-4 times [name] day throughout the day. Avoid standing for long periods of time. Patient to wear own compression stockings every day. - farrow wrap to right leg when not wearing compression wraps Exercise regularly WOUND #8: - Lower Leg Wound Laterality: Right, Medial Cleanser: Soap and Water 1 x Per Week/10 Days Discharge Instructions: May shower and wash wound with dial antibacterial soap and water prior to dressing change. SCHMICK, Diem [name] ([id number] Page 8 of 10 Cleanser: Wound Cleanser 1 x Per Week/10 Days Discharge Instructions: Cleanse the wound with wound cleanser prior to applying [name] clean dressing using gauze sponges, not tissue or cotton balls. Peri-Wound Care: Sween Lotion (Moisturizing lotion) 1 x Per Week/10 Days Discharge  Instructions: Apply moisturizing lotion as directed Prim Dressing: Sorbalgon AG Dressing 2x2 (in/in) 1 x Per Week/10 Days ary Discharge Instructions: Apply to wound bed as instructed Secondary Dressing: Woven Gauze Sponge, Non-Sterile 4x4 in 1 x Per Week/10 Days Discharge Instructions: Apply over primary dressing as directed. Com pression Wrap: FourPress (4 layer compression wrap) 1 x Per Week/10 Days Discharge Instructions: Apply four layer compression as directed. May also use Urgo K2 compression system as alternative. WOUND #9: - Lower Leg Wound Laterality: Left, Anterior Cleanser: Soap and Water 1 x Per Week/10 Days Discharge Instructions: May shower and wash wound with dial antibacterial soap and water prior to dressing change. Cleanser: Wound Cleanser 1 x Per Week/10 Days Discharge Instructions: Cleanse the wound with wound cleanser prior to applying [name] clean  dressing using gauze sponges, not tissue or cotton balls. Peri-Wound Care: Sween Lotion (Moisturizing lotion) 1 x Per Week/10 Days Discharge Instructions: Apply moisturizing lotion as directed Prim Dressing: Sorbalgon AG Dressing 2x2 (in/in) 1 x Per Week/10 Days ary Discharge Instructions: Apply to wound bed as instructed Secondary Dressing: Woven Gauze Sponge, Non-Sterile 4x4 in 1 x Per Week/10 Days Discharge Instructions: Apply over primary dressing as directed. Com pression Wrap: FourPress (4 layer compression wrap) 1 x Per Week/10 Days Discharge Instructions: Apply four layer compression as directed. May also use Urgo K2 compression system as alternative. 05/17/2022: He has [name] new wound on his left anterior lower leg. Despite taking his diuretics and wearing his compression wraps, his edema control is inadequate. I used scissors and forceps to trim away the dead skin from the new wound on his left leg. We will apply silver alginate and 4-layer compression bilaterally. Given the failure of adequate compression and diuretic use to control his lymphedema and persistent blistering and wounding events, I think he would benefit from lymphedema pumps. He is unable to exercise due to his obesity. He has [name] preserved ejection fraction on his most recent echocardiogram. We will work on getting him approved for pumps Electronic Signature(s) Signed: 05/17/2022 12:10:37 PM By: [name] MD FACS Entered By: [name] on 05/17/2022 12:10:37 -------------------------------------------------------------------------------- HxROS Details Patient Name: Date of Service: [name] CKMA N, [name]. 05/17/2022 11:00 [name] Medical Record Number: [id number] Patient Account Number: [id number] Date of Birth/Sex: Treating RN: 06-23-1945 (77 y.o. M) Primary Care Provider: [name] Other Clinician: Referring Provider: Treating Provider/Extender: [name] in Treatment:  13 Information Obtained From Patient Constitutional Symptoms (General Health) Medical History: Past Medical History Notes: obsesity Hematologic/Lymphatic Medical History: Positive for: Anemia; Lymphedema Past Medical History Notes: chronic anticoagulation Respiratory Medical History: Positive for: Sleep Apnea Cardiovascular Medical History: Positive for: Arrhythmia - PAF; Congestive Heart Failure - diastolic; Hypertension Holt, Maximilliano [name] ([id number].pdf Page 9 of 10 Past Medical History Notes: pericarditis Gastrointestinal Medical History: Past Medical History Notes: pt says 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 but they could not prove it Endocrine Medical History: Positive for: Type II Diabetes - takes no meds, "borderline A1c" Time with diabetes: 2-3 years ago Blood sugar tested every day: No Genitourinary Medical History: Past Medical History Notes: BPH Neurologic Medical History: Positive for: Neuropathy Past Medical History Notes: 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, embolic cerebral infarction, CVA Oncologic Medical History: Negative for: Received Chemotherapy; Received Radiation Psychiatric Medical History: Past Medical History Notes: depression Immunizations Pneumococcal Vaccine: Received Pneumococcal Vaccination: Yes Received Pneumococcal Vaccination On or After 60th Birthday: Yes Implantable Devices None Hospitalization / Surgery History Type of Hospitalization/Surgery right and  left heart cath colonoscopy esophagogastroduodenoscopy subxyphoid pericardial window intraoperative transesophageal echocardiogram laparoscopic gastric banding with hiatal hernia repair breath tek h pylori cholecysectomy arthroscopy knee w/ drilling lithotripsy tonsillectomy total knee replacement trigger finger repair wisdom tooth extraction Family and Social History Cancer: Yes - Paternal Grandparents; Diabetes: No;  Heart Disease: Yes - Maternal Grandparents,Father; Hereditary Spherocytosis: No; Hypertension: No; Kidney Disease: No; Lung Disease: No; Seizures: No; Stroke: No; Thyroid Problems: No; Tuberculosis: No; Former smoker - smoked as [name] teen; Marital Status - Married; Alcohol Use: Never; Drug Use: No History; Caffeine Use: Never; Financial Concerns: No; Food, Clothing or Shelter Needs: No; Support System Lacking: No; Transportation Concerns: No Electronic Signature(s) Signed: 05/17/2022 12:11:31 PM By: [name] MD FACS Entered By: [name] on 05/17/2022 12:06:44 [place], [place] ([id number] Page 10 of 10 -------------------------------------------------------------------------------- SuperBill Details Patient Name: Date of Service: [name] [name], [name]. 05/17/2022 Medical Record Number: [id number] Patient Account Number: [id number] Date of Birth/Sex: Treating RN: 1945-11-07 (77 y.o. 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 I50.30 Unspecified diastolic (congestive) heart failure J96.11 Chronic respiratory failure with hypoxia E66.01 Morbid (severe) obesity due to excess calories I89.0 Lymphedema, not elsewhere classified Facility Procedures : CPT4 Code: [id number] Description: [id number] - DEBRIDE WOUND 1ST 20 SQ CM OR < ICD-10 Diagnosis Description L97.829 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 Modifier: ity Quantity: 1 Physician Procedures : CPT4 Code Description Modifier [id number] - WC PHYS LEVEL 3 - EST PT 25 ICD-10 Diagnosis Description  L97.811 Non-pressure chronic ulcer of other part of right lower leg limited to breakdown of skin L97.829 Non-pressure chronic ulcer of other part of  left lower leg with unspecified severity I50.30 Unspecified diastolic (congestive) heart failure I89.0 Lymphedema, not elsewhere classified Quantity: 1 : [id number] - WC PHYS DEBR WO ANESTH 20 SQ CM ICD-10 Diagnosis Description L97.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

## 2022-05-19 NOTE — Progress Notes (Signed)
[name], [name] [id number] Page 1 of 9 Visit Report for 05/17/2022 Arrival Information Details Patient Name: Date of Service: [name], [place] [name]. 05/17/2022 11:00 [name] Medical Record Number: [id number] Patient Account Number: [id number] Date of Birth/Sex: Treating RN: [birth year] (77 y.o. M) Primary Care [name]: [name] Other Clinician: Referring [name]: Treating [name]/Extender: [name] in Treatment: 65 Visit Information History Since Last Visit All ordered tests and consults were completed: No Patient Arrived: Wheel Chair Added or deleted any medications: No Arrival Time: 11:14 Any new allergies or adverse reactions: No Accompanied By: wife Had [name] fall or experienced change in No Transfer Assistance: None activities of daily living that [name] affect Patient Identification Verified: Yes risk of falls: Secondary Verification Process Completed: Yes Signs or symptoms of abuse/neglect since last visito No Patient Requires Transmission-Based Precautions: No Hospitalized since last visit: No Patient Has Alerts: Yes Implantable device outside of the clinic excluding No Patient Alerts: Patient on Blood Thinner cellular tissue based products placed in the center since last visit: Pain Present Now: No Electronic Signature(s) Signed: 05/17/2022 1:11:53 PM By: Worthy Rancher Entered By: Worthy Rancher on 05/17/2022 11:14:39 -------------------------------------------------------------------------------- Compression Therapy Details Patient Name: Date of Service: [name], [name]. 05/17/2022 11:00 [name] Medical Record Number: [id number] Patient Account Number: [id number] Date of Birth/Sex: Treating RN: [birth year] [name] (77 y.o. Collene Gobble Primary Care [name]: [name] Other Clinician: Referring [name]: Treating [name]/Extender: [name] in Treatment: 13 Compression Therapy  Performed for Wound Assessment: Wound #8 Right,Medial Lower Leg Performed By: Clinician [name] Catholic, RN Compression Type: Four Layer Post Procedure Diagnosis Same as Pre-procedure Electronic Signature(s) Signed: 05/17/2022 5:28:16 PM By: [name] Catholic RN Entered By: [name] on 05/17/2022 12:03:02 [name] ([id number] Page 2 of 9 -------------------------------------------------------------------------------- Compression Therapy Details Patient Name: Date of Service: [name], [place] [name]. 05/17/2022 11:00 [name] Medical Record Number: [id number] Patient Account Number: [id number] Date of Birth/Sex: Treating RN: [name] (77 y.o. Collene Gobble Primary Care [address]: [name] Other Clinician: Referring [name]: Treating [name]/Extender: [name] in Treatment: 13 Compression Therapy Performed for Wound Assessment: Wound #9 Left,Anterior Lower Leg Performed By: Clinician [name] Catholic, RN Compression Type: Four Layer Post Procedure Diagnosis Same as Pre-procedure Electronic Signature(s) Signed: 05/17/2022 5:28:16 PM By: [name] Catholic RN Entered By: [name] on 05/17/2022 12:03:02 -------------------------------------------------------------------------------- Encounter Discharge Information Details Patient Name: Date of Service: [name] [name], [name]. 05/17/2022 11:00 [name] Medical Record Number: [id number] Patient Account Number: [id number] Date of Birth/Sex: Treating RN: [name] (77 y.o. Collene Gobble Primary Care Anjel Perfetti: [name] Other Clinician: Referring [name]: Treating [name]/Extender: [name] in Treatment: 13 Encounter Discharge Information Items Post Procedure Vitals Discharge Condition: Stable Temperature (F): 98.3 Ambulatory Status: Wheelchair Pulse (bpm): 58 Discharge Destination: Home Respiratory Rate  (breaths/min): 18 Transportation: Private Auto Blood Pressure (mmHg): 126/57 Accompanied By: spouse Schedule Follow-up Appointment: Yes Clinical Summary of Care: Patient Declined Electronic Signature(s) Signed: 05/17/2022 5:28:16 PM By: [name] Catholic RN Entered By: [name] on 05/17/2022 17:27:32 -------------------------------------------------------------------------------- Lower Extremity Assessment Details Patient Name: Date of Service: [name] [name], [name]. 05/17/2022 11:00 [name] Medical Record Number: [id number] Patient Account Number: [id number] Date of Birth/Sex: Treating RN: [name] (77 y.o. Collene Gobble Primary Care [name]: [name] Other Clinician: Referring [name]: Treating [name]/Extender: [name] in Treatment: 13 Edema Assessment Assessed: [name]: No] [name]: No] [Left: Edema] [Right: :]  Calf Trageser, [name] ([id number] Page 3 of 9 Left: Right: Point of Measurement: From Medial Instep 50.5 cm 57.5 cm Ankle Left: Right: Point of Measurement: From Medial Instep 31 cm 33 cm Vascular Assessment Pulses: Dorsalis Pedis Palpable: [Left:Yes] [Right:Yes] Electronic Signature(s) Signed: 05/17/2022 5:28:16 PM By: [name] Catholic RN Entered By: [name] on 05/17/2022 11:40:32 -------------------------------------------------------------------------------- Multi Wound Chart Details Patient Name: Date of Service: [name], [name]. 05/17/2022 11:00 [name] Medical Record Number: [id number] Patient Account Number: [id number] Date of Birth/Sex: Treating RN: [birth year] [name] (77 y.o. M) Primary Care [name]: [name] Other Clinician: Referring [name]: Treating [name]/Extender: [name] in Treatment: 13 Vital Signs Height(in): 72 Pulse(bpm): 37 Weight(lbs): 375 Blood Pressure(mmHg): 126/51 Body Mass Index(BMI): 50.9 Temperature(F):  98.3 Respiratory Rate(breaths/min): 18 [8:Photos:] [N/[name]:N/[name]] Right, Medial Lower Leg Left, Anterior Lower Leg N/[name] Wound Location: Blister Gradually Appeared N/[name] Wounding Event: Lymphedema Lymphedema N/[name] Primary Etiology: Anemia, Lymphedema, Sleep Apnea, Anemia, Lymphedema, Sleep Apnea, N/[name] Comorbid History: Arrhythmia, Congestive Heart Failure, Arrhythmia, Congestive Heart Failure, Hypertension, Type II Diabetes, Hypertension, Type II Diabetes, Neuropathy Neuropathy 05/02/2022 05/13/2022 N/[name] Date Acquired: 2 0 N/[name] Weeks of Treatment: Open Open N/[name] Wound Status: No No N/[name] Wound Recurrence: No Yes N/[name] Clustered Wound: 1.4x0.9x0.1 8.6x5.3x0.1 N/[name] Measurements L x W x D (cm) 0.99 35.798 N/[name] [name] (cm) : rea 0.099 3.58 N/[name] Volume (cm) : 16.00% N/[name] N/[name] % Reduction in Area: 16.10% N/[name] N/[name] % Reduction in Volume: Full Thickness Without Exposed Full Thickness Without Exposed N/[name] Classification: Support Structures Support Structures Medium Medium N/[name] Exudate Amount: Serous Serosanguineous N/[name] Exudate Type: amber red, brown N/[name] Exudate Color: Distinct, outline attached N/[name] N/[name] Wound Margin: [place], [place] [name] ([id number] Page 4 of 9 Large (67-100%) Medium (34-66%) N/[name] Granulation [name] mount: Red Red, Hyper-granulation N/[name] Granulation Quality: Small (1-33%) Medium (34-66%) N/[name] Necrotic Amount: Adherent Slough Eschar, Adherent Slough N/[name] Necrotic Tissue: Fat Layer (Subcutaneous Tissue): Yes N/[name] N/[name] Exposed Structures: Fascia: No Tendon: No Muscle: No Joint: No Bone: No Small (1-33%) Small (1-33%) N/[name] Epithelialization: No Abnormalities Noted Scarring: Yes N/[name] Periwound Skin Texture: Excoriation: No Induration: No Callus: No Crepitus: No Rash: No No Abnormalities Noted Maceration: No N/[name] Periwound Skin Moisture: Dry/Scaly: No Rubor: Yes Hemosiderin Staining: Yes N/[name] Periwound Skin Color: Erythema: No Atrophie Blanche:  No Cyanosis: No Ecchymosis: No Erythema: No Mottled: No Pallor: No Rubor: No No Abnormality No Abnormality N/[name] Temperature: Treatment Notes Electronic Signature(s) Signed: 05/17/2022 11:52:37 AM By: [name] MD FACS Entered By: [name] on 05/17/2022 11:52:37 -------------------------------------------------------------------------------- Multi-Disciplinary Care Plan Details Patient Name: Date of Service: [name] [name], [name]. 05/17/2022 11:00 [name] Medical Record Number: [id number] Patient Account Number: [id number] Date of Birth/Sex: Treating RN: [name][birth year] (77 y.o. Collene Gobble Primary Care Jerzy Crotteau: [name] Other Clinician: Referring Kali Ambler: Treating Wyett Narine/Extender: [name] in Treatment: 13 Active Inactive Necrotic Tissue Nursing Diagnoses: Impaired tissue integrity related to necrotic/devitalized tissue Knowledge deficit related to management of necrotic/devitalized tissue Goals: Necrotic/devitalized tissue will be minimized in the wound bed Date Initiated: 02/13/2022 Target Resolution Date: 06/02/2022 Goal Status: Active Patient/caregiver will verbalize understanding of reason and process for debridement of necrotic tissue Date Initiated: 02/13/2022 Target Resolution Date: 06/02/2022 Goal Status: Active Interventions: Assess patient pain level pre-, during and post procedure and prior to discharge Provide education on necrotic tissue and debridement process Treatment Activities: Apply topical anesthetic as ordered : 02/13/2022 Notes: LEVIE, JEANPIERRE [name] ([id number]) [id number] Page 5 of 9 Wound/Skin Impairment Nursing  Diagnoses: Impaired tissue integrity Knowledge deficit related to ulceration/compromised skin integrity Goals: Patient/caregiver will verbalize understanding of skin care regimen Date Initiated: 02/13/2022 Target Resolution Date: 06/30/2022 Goal Status:  Active Interventions: Assess ulceration(s) every visit Treatment Activities: Skin care regimen initiated : 02/13/2022 Topical wound management initiated : 02/13/2022 Notes: Electronic Signature(s) Signed: 05/17/2022 5:28:16 PM By: [name] Catholic RN Entered By: [name] on 05/17/2022 17:26:09 -------------------------------------------------------------------------------- Pain Assessment Details Patient Name: Date of Service: [place], [name]. 05/17/2022 11:00 [name] Medical Record Number: [id number] Patient Account Number: [id number] Date of Birth/Sex: Treating RN: [birth year] (77 y.o. M) Primary Care Vennesa Bastedo: [name] Other Clinician: Referring Bellina Tokarczyk: Treating Alvey Brockel/Extender: [name] in Treatment: 13 Active Problems Location of Pain Severity and Description of Pain Patient Has Paino No Site Locations Pain Management and Medication Current Pain Management: Electronic Signature(s) Signed: 05/17/2022 1:11:53 PM By: Worthy Rancher Entered By: Worthy Rancher on 05/17/2022 11:15:18 [place], [name] ([id number] Page 6 of 9 -------------------------------------------------------------------------------- Patient/Caregiver Education Details Patient Name: Date of Service: [name] TRENT, BLONSKI [name]. 2/14/2024andnbsp11:00 [place] Record Number: [id number] Patient Account Number: [id number] Date of Birth/Gender: Treating RN: [birth year] (77 y.o. Collene Gobble Primary Care Physician: [name] Other Clinician: Referring Physician: Treating Physician/Extender: [name] in Treatment: 13 Education Assessment Education Provided To: Patient Education Topics Provided Wound/Skin Impairment: Methods: Explain/Verbal Responses: Return demonstration correctly Electronic Signature(s) Signed: 05/17/2022 5:28:16 PM By: [name] Catholic RN Entered By: [name] on 05/17/2022  17:26:23 -------------------------------------------------------------------------------- Wound Assessment Details Patient Name: Date of Service: [name] CKMA N, [name]. 05/17/2022 11:00 [name] Medical Record Number: [id number] Patient Account Number: [id number] Date of Birth/Sex: Treating RN: [birth year][name] (77 y.o. Collene Gobble Primary Care Feven Alderfer: [name] Other Clinician: Referring Yolani Vo: Treating Audreanna Torrisi/Extender: [name] in Treatment: 13 Wound Status Wound Number: 8 Primary Lymphedema Etiology: Wound Location: Right, Medial Lower Leg Wound Open Wounding Event: Blister Status: Date Acquired: 05/02/2022 Comorbid Anemia, Lymphedema, Sleep Apnea, Arrhythmia, Congestive Heart Weeks Of Treatment: 2 History: Failure, Hypertension, Type II Diabetes, Neuropathy Clustered Wound: No Photos Wound Measurements Length: (cm) 1.4 Width: (cm) 0.9 [name], [name] ([id number]) Depth: (cm) 0.1 Area: (cm) 0.99 Volume: (cm) 0.099 % Reduction in Area: 16% % Reduction in Volume: 16.1% [id number] Page 7 of 9 Epithelialization: Small (1-33%) Tunneling: No Undermining: No Wound Description Classification: Full Thickness Without Exposed Support Structures Wound Margin: Distinct, outline attached Exudate Amount: Medium Exudate Type: Serous Exudate Color: amber Foul Odor After Cleansing: No Slough/Fibrino Yes Wound Bed Granulation Amount: Large (67-100%) Exposed Structure Granulation Quality: Red Fascia Exposed: No Necrotic Amount: Small (1-33%) Fat Layer (Subcutaneous Tissue) Exposed: Yes Necrotic Quality: Adherent Slough Tendon Exposed: No Muscle Exposed: No Joint Exposed: No Bone Exposed: No Periwound Skin Texture Texture Color No Abnormalities Noted: Yes No Abnormalities Noted: No Erythema: No Moisture Rubor: Yes No Abnormalities Noted: Yes Temperature / Pain Temperature: No Abnormality Treatment  Notes Wound #8 (Lower Leg) Wound Laterality: Right, Medial Cleanser Soap and Water Discharge Instruction: [name] shower and wash wound with dial antibacterial soap and water prior to dressing change. Wound Cleanser Discharge Instruction: Cleanse the wound with wound cleanser prior to applying [name] clean dressing using gauze sponges, not tissue or cotton balls. Peri-Wound Care Sween Lotion (Moisturizing lotion) Discharge Instruction: Apply moisturizing lotion as directed Topical Primary Dressing Sorbalgon AG Dressing 2x2 (in/in) Discharge Instruction: Apply to wound bed as instructed Secondary Dressing Woven Gauze Sponge, Non-Sterile 4x4 in Discharge Instruction: Apply over  primary dressing as directed. Secured With Compression Wrap FourPress (4 layer compression wrap) Discharge Instruction: Apply four layer compression as directed. [name] also use Urgo K2 compression system as alternative. Compression Stockings Add-Ons Electronic Signature(s) Signed: 05/17/2022 5:28:16 PM By: [name] Catholic RN Entered By: [name] on 05/17/2022 11:52:00 [place], [name] ([id number] Page 8 of 9 -------------------------------------------------------------------------------- Wound Assessment Details Patient Name: Date of Service: [name], [name]. 05/17/2022 11:00 [name] Medical Record Number: [id number] Patient Account Number: [id number] Date of Birth/Sex: Treating RN: [birth year][name] (77 y.o. Collene Gobble Primary Care Kashis Penley: [name] Other Clinician: Referring Brooklyne Radke: Treating Ianmichael Amescua/Extender: [name] in Treatment: 13 Wound Status Wound Number: 9 Primary Lymphedema Etiology: Wound Location: Left, Anterior Lower Leg Wound Open Wounding Event: Gradually Appeared Status: Date Acquired: 05/13/2022 Comorbid Anemia, Lymphedema, Sleep Apnea, Arrhythmia, Congestive Heart Weeks Of Treatment: 0 History: Failure,  Hypertension, Type II Diabetes, Neuropathy Clustered Wound: Yes Photos Wound Measurements Length: (cm) 8.6 Width: (cm) 5.3 Depth: (cm) 0.1 Area: (cm) 35.798 Volume: (cm) 3.58 % Reduction in Area: % Reduction in Volume: Epithelialization: Small (1-33%) Tunneling: No Undermining: No Wound Description Classification: Full Thickness Without Exposed Support Structures Exudate Amount: Medium Exudate Type: Serosanguineous Exudate Color: red, brown Foul Odor After Cleansing: No Slough/Fibrino Yes Wound Bed Granulation Amount: Medium (34-66%) Granulation Quality: Red, Hyper-granulation Necrotic Amount: Medium (34-66%) Necrotic Quality: Eschar, Adherent Slough Periwound Skin Texture Texture Color No Abnormalities Noted: No No Abnormalities Noted: No Callus: No Atrophie Blanche: No Crepitus: No Cyanosis: No Excoriation: No Ecchymosis: No Induration: No Erythema: No Rash: No Hemosiderin Staining: Yes Scarring: Yes Mottled: No Pallor: No Moisture Rubor: No No Abnormalities Noted: Yes Temperature / Pain Temperature: No Abnormality Treatment Notes Wound #9 (Lower Leg) Wound Laterality: Left, Anterior Cleanser Soap and Water Discharge Instruction: [name] shower and wash wound with dial antibacterial soap and water prior to dressing change. Wound Cleanser [place], [name] ([id number] Page 9 of 9 Discharge Instruction: Cleanse the wound with wound cleanser prior to applying [name] clean dressing using gauze sponges, not tissue or cotton balls. Peri-Wound Care Sween Lotion (Moisturizing lotion) Discharge Instruction: Apply moisturizing lotion as directed Topical Primary Dressing Sorbalgon AG Dressing 2x2 (in/in) Discharge Instruction: Apply to wound bed as instructed Secondary Dressing Woven Gauze Sponge, Non-Sterile 4x4 in Discharge Instruction: Apply over primary dressing as directed. Secured With Compression Wrap FourPress (4 layer  compression wrap) Discharge Instruction: Apply four layer compression as directed. [name] also use Urgo K2 compression system as alternative. Compression Stockings Add-Ons Electronic Signature(s) Signed: 05/17/2022 5:28:16 PM By: [name] Catholic RN Entered By: [name] on 05/17/2022 11:51:29 -------------------------------------------------------------------------------- Vitals Details Patient Name: Date of Service: [name] CKMA N, Orren [name]. 05/17/2022 11:00 [name] Medical Record Number: [id number] Patient Account Number: [id number] Date of Birth/Sex: Treating RN: [birth year] [name] (77 y.o. M) Primary Care Garry Nicolini: [name] Other Clinician: Referring Arwin Bisceglia: Treating Jermar Colter/Extender: [name] in Treatment: 13 Vital Signs Time Taken: 11:14 Temperature (F): 98.3 Height (in): 72 Pulse (bpm): 58 Weight (lbs): 375 Respiratory Rate (breaths/min): 18 Body Mass Index (BMI): 50.9 Blood Pressure (mmHg): 126/51 Reference Range: 80 - 120 mg / dl Electronic Signature(s) Signed: 05/17/2022 1:11:53 PM By: Worthy Rancher Entered By: Worthy Rancher on 05/17/2022 11:15:11

## 2022-05-24 ENCOUNTER — Encounter (HOSPITAL_BASED_OUTPATIENT_CLINIC_OR_DEPARTMENT_OTHER): Payer: Medicare Other | Admitting: General Surgery

## 2022-05-24 DIAGNOSIS — L97821 Non-pressure chronic ulcer of other part of left lower leg limited to breakdown of skin: Secondary | ICD-10-CM | POA: Diagnosis not present

## 2022-05-24 DIAGNOSIS — I5030 Unspecified diastolic (congestive) heart failure: Secondary | ICD-10-CM | POA: Diagnosis not present

## 2022-05-24 DIAGNOSIS — L97812 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d: Secondary | ICD-10-CM | POA: Diagnosis not present

## 2022-05-24 DIAGNOSIS — I890 Lymphedema, not elsewhere classified: Secondary | ICD-10-CM | POA: Diagnosis not present

## 2022-05-24 DIAGNOSIS — L97822 Non-pressure chronic ulcer of other part of left lower leg with fat layer exposed: Secondary | ICD-10-CM | POA: Diagnosis not present

## 2022-05-24 DIAGNOSIS — E11622 Type 2 diabetes mellitus with other skin ulcer: Secondary | ICD-10-CM | POA: Diagnosis not present

## 2022-05-24 DIAGNOSIS — J9611 Chronic respiratory failure with hypoxia: Secondary | ICD-10-CM | POA: Diagnosis not present

## 2022-05-25 ENCOUNTER — Ambulatory Visit: Payer: Medicare Other | Admitting: Emergency Medicine

## 2022-05-25 NOTE — Progress Notes (Signed)
[name], [name] [name] ([id number].pdf Page 1 of 9 Visit Report for 05/24/2022 Arrival Information Details Patient Name: Date of Service: [name], [name] [name]. 05/24/2022 3:30 PM Medical Record Number: [id number] Patient Account Number: [id number] Date of Birth/Sex: Treating RN: 26-Dec-1945 (77 y.o. M) Primary Care [name] [name]: [name] [name] Other Clinician: Referring [name] [name]: Treating [name] [name]/Extender: [name] [name] in Treatment: 69 Visit Information History Since Last Visit All ordered tests and consults were completed: No Patient Arrived: Wheel Chair Added or deleted any medications: No Arrival Time: 15:15 Any new allergies or adverse reactions: No Accompanied By: self Had [name] fall or experienced change in No Transfer Assistance: None activities of daily living that may affect Patient Identification Verified: Yes risk of falls: Secondary Verification Process Completed: Yes Signs or symptoms of abuse/neglect since last visito No Patient Requires Transmission-Based Precautions: No Hospitalized since last visit: No Patient Has Alerts: Yes Implantable device outside of the clinic excluding No Patient Alerts: Patient on Blood Thinner cellular tissue based products placed in the center since last visit: Pain Present Now: No Electronic Signature(s) Signed: 05/24/2022 4:00:21 PM By: Worthy [name] Entered By: Worthy [name] on 05/24/2022 15:15:39 -------------------------------------------------------------------------------- Compression Therapy Details Patient Name: Date of Service: [name] [name] [name], [name] [name]. 05/24/2022 3:30 PM Medical Record Number: [id number] Patient Account Number: [id number] Date of Birth/Sex: Treating RN: 01-19-1946 (77 y.o. [name] Primary Care Ahuva Poynor: [name] [name] Other Clinician: Referring Kalima Saylor: Treating [name] [name]/Extender: [name] [name] in Treatment: 14 Compression Therapy  Performed for Wound Assessment: Wound #9 Left,Anterior Lower Leg Performed By: Clinician [name] Gouty, RN Compression Type: Four Layer Post Procedure Diagnosis Same as Pre-procedure Electronic Signature(s) Signed: 05/24/2022 5:50:47 PM By: [name] Gouty RN, BSN Entered By: [name] [name] on 05/24/2022 15:50:20 Helder, [name] [name] ([id number].pdf Page 2 of 9 -------------------------------------------------------------------------------- Encounter Discharge Information Details Patient Name: Date of Service: [name] [name] [name]. 05/24/2022 3:30 PM Medical Record Number: [id number] Patient Account Number: [id number] Date of Birth/Sex: Treating RN: 20-Jan-1946 (77 y.o. [name] Primary Care Aison Malveaux: [name] [name] Other Clinician: Referring Donnamarie Shankles: Treating [name] [name]/Extender: [name] [name] in Treatment: 14 Encounter Discharge Information Items Post Procedure Vitals Discharge Condition: Stable Temperature (F): 98.2 Ambulatory Status: Wheelchair Pulse (bpm): 68 Discharge Destination: Home Respiratory Rate (breaths/min): 20 Transportation: Private Auto Blood Pressure (mmHg): 121/63 Accompanied By: self Schedule Follow-up Appointment: Yes Clinical Summary of Care: Patient Declined Electronic Signature(s) Signed: 05/24/2022 5:50:47 PM By: [name] Gouty RN, BSN Entered By: [name] [name] on 05/24/2022 16:22:41 -------------------------------------------------------------------------------- Lower Extremity Assessment Details Patient Name: Date of Service: [name] [name] [name], [name] [name]. 05/24/2022 3:30 PM Medical Record Number: [id number] Patient Account Number: [id number] Date of Birth/Sex: Treating RN: 20-Jun-1945 (77 y.o. [name] Primary Care Mayreli Alden: [name] [name] Other Clinician: Referring Maryam Feely: Treating [name] [name]/Extender: [name] [name] in Treatment: 14 Edema  Assessment Assessed: [name] [name]: No] [name] [name]: No] Edema: [Left: Yes] [Right: Yes] Calf Left: Right: Point of Measurement: From Medial Instep 50.5 cm 57.5 cm Ankle Left: Right: Point of Measurement: From Medial Instep 31 cm 33 cm Electronic Signature(s) Signed: 05/24/2022 5:50:47 PM By: [name] Gouty RN, BSN Entered By: [name] [name] on 05/24/2022 15:50:01 -------------------------------------------------------------------------------- Multi Wound Chart Details Patient Name: Date of Service: Kathyrn [name], [name] [name]. 05/24/2022 3:30 PM Medical Record Number: [id number] Patient Account Number: [id number] Date of Birth/Sex: Treating RN: Sep 20, 1945 (77 y.o. M) Primary Care [name] [name]: [name] [name] Other Clinician: Referring [name] [name]: Treating [name] [name]/Extender: [name] [name] in Treatment: [place], [name] (  [id number] Page 3 of 9 Vital Signs Height(in): 72 Pulse(bpm): 68 Weight(lbs): 375 Blood Pressure(mmHg): 121/63 Body Mass Index(BMI): 50.9 Temperature(F): 98.2 Respiratory Rate(breaths/min): 18 [8:Photos:] [9:No Photos] Right, Medial Lower Leg Left, Anterior Lower Leg Left, Anterior Lower Leg Wound Location: Blister Gradually Appeared Gradually Appeared Wounding Event: Lymphedema Lymphedema Lymphedema Primary Etiology: Anemia, Lymphedema, Sleep Apnea, Anemia, Lymphedema, Sleep Apnea, Anemia, Lymphedema, Sleep Apnea, Comorbid History: Arrhythmia, Congestive Heart Failure, Arrhythmia, Congestive Heart Failure, Arrhythmia, Congestive Heart Failure, Hypertension, Type II Diabetes, Hypertension, Type II Diabetes, Hypertension, Type II Diabetes, Neuropathy Neuropathy Neuropathy 05/02/2022 05/13/2022 05/13/2022 Date Acquired: 3 1 1 $ Weeks of Treatment: Open Open Open Wound Status: No No No Wound Recurrence: No Yes Yes Clustered Wound: 0x0x0 3.4x0.8x0.1 3.4x0.8x0.1 Measurements L x W x D (cm) 0 2.136 2.136 [name] (cm)  : rea 0 0.214 0.214 Volume (cm) : 100.00% 94.00% 94.00% % Reduction in Area: 100.00% 94.00% 94.00% % Reduction in Volume: Full Thickness Without Exposed Full Thickness Without Exposed Full Thickness Without Exposed Classification: Support Structures Support Structures Support Structures None Present Medium Medium Exudate [name] mount: [name]/[name] Serous Serosanguineous Exudate Type: [name]/[name] amber red, brown Exudate Color: [name]/[name] Flat and Intact [name]/[name] Wound Margin: None Present (0%) Large (67-100%) [name]/[name] Granulation Amount: [name]/[name] Red [name]/[name] Granulation Quality: None Present (0%) Small (1-33%) [name]/[name] Necrotic Amount: [name]/[name] Eschar [name]/[name] Necrotic Tissue: Fascia: No Fascia: No [name]/[name] Exposed Structures: Fat Layer (Subcutaneous Tissue): No Fat Layer (Subcutaneous Tissue): No Tendon: No Tendon: No Muscle: No Muscle: No Joint: No Joint: No Bone: No Bone: No Limited to Skin Breakdown Large (67-100%) Large (67-100%) [name]/[name] Epithelialization: [name]/[name] Debridement - Selective/Open Wound Debridement - Selective/Open Wound Debridement: [name]/[name] 15:45 15:45 Pre-procedure Verification/Time Out Taken: [name]/[name] Lidocaine 4% Topical Solution Lidocaine 4% Topical Solution Pain Control: [name]/[name] Necrotic/Eschar Necrotic/Eschar Tissue Debrided: [name]/[name] Non-Viable Tissue Non-Viable Tissue Level: [name]/[name] 0.5 0.5 Debridement [name] (sq cm): rea [name]/[name] Curette Curette Instrument: [name]/[name] Minimum Minimum Bleeding: [name]/[name] Pressure Pressure Hemostasis [name] chieved: [name]/[name] 0 0 Procedural Pain: [name]/[name] 0 0 Post Procedural Pain: [name]/[name] Procedure was tolerated well Procedure was tolerated well Debridement Treatment Response: [name]/[name] 3.4x0.8x0.1 3.4x0.8x0.1 Post Debridement Measurements L x W x D (cm) [name]/[name] 0.214 0.214 Post Debridement Volume: (cm) No Abnormalities Noted Excoriation: No Periwound Skin Texture: Induration: No Callus: No Crepitus: No Rash: No Scarring: No No Abnormalities Noted Maceration: No Periwound Skin Moisture: Dry/Scaly: No Hemosiderin Staining:  Yes Hemosiderin Staining: Yes Periwound Skin Color: Erythema: No Atrophie Blanche: No Rubor: No Cyanosis: No Ecchymosis: No Erythema: No Parrella, Yarden [name] ([id number].pdf Page 4 of 9 Mottled: No Pallor: No Rubor: No No Abnormality No Abnormality [name]/[name] Temperature: [name]/[name] Compression Therapy Compression Therapy Procedures Performed: Debridement Debridement Treatment Notes Electronic Signature(s) Signed: 05/24/2022 4:06:18 PM By: [name] MD FACS Entered By: [name] on 05/24/2022 [id number] -------------------------------------------------------------------------------- Multi-Disciplinary Care Plan Details Patient Name: Date of Service: Kathyrn [name], [name] [name]. 05/24/2022 3:30 PM Medical Record Number: [id number] Patient Account Number: [id number] Date of Birth/Sex: Treating RN: June 08, 1945 (77 y.o. [name] Primary Care Susana Duell: [name] [name] Other Clinician: Referring Brayn Eckstein: Treating Vandella Ord/Extender: [name] [name] in Treatment: Hyder reviewed with physician Active Inactive Necrotic Tissue Nursing Diagnoses: Impaired tissue integrity related to necrotic/devitalized tissue Knowledge deficit related to management of necrotic/devitalized tissue Goals: Necrotic/devitalized tissue will be minimized in the wound bed Date Initiated: 02/13/2022 Target Resolution Date: 06/02/2022 Goal Status: Active Patient/caregiver will verbalize understanding of reason and process for debridement of necrotic tissue Date Initiated: 02/13/2022 Date Inactivated: 05/24/2022 Target  Resolution Date: 06/02/2022 Goal Status: Met Interventions: Assess patient pain level pre-, during and post procedure and prior to discharge Provide education on necrotic tissue and debridement process Treatment Activities: Apply topical anesthetic as ordered : 02/13/2022 Notes: Wound/Skin Impairment Nursing  Diagnoses: Impaired tissue integrity Knowledge deficit related to ulceration/compromised skin integrity Goals: Patient/caregiver will verbalize understanding of skin care regimen Date Initiated: 02/13/2022 Target Resolution Date: 06/30/2022 Goal Status: Active Interventions: Assess ulceration(s) every visit Treatment Activities: Skin care regimen initiated : 02/13/2022 Topical wound management initiated : 02/13/2022 [name], [name] [name] ([id number]) [id number] Page 5 of 9 Notes: Electronic Signature(s) Signed: 05/24/2022 5:50:47 PM By: [name] Gouty RN, BSN Entered By: [name] [name] on 05/24/2022 15:52:53 -------------------------------------------------------------------------------- Pain Assessment Details Patient Name: Date of Service: [name] [name] [name], Jasman [name]. 05/24/2022 3:30 PM Medical Record Number: [id number] Patient Account Number: [id number] Date of Birth/Sex: Treating RN: October 17, 1945 (77 y.o. M) Primary Care Allie Gerhold: [name] [name] Other Clinician: Referring Latravious Levitt: Treating Denine Brotz/Extender: [name] [name] in Treatment: 14 Active Problems Location of Pain Severity and Description of Pain Patient Has Paino Yes Site Locations Rate the pain. Current Pain Level: 4 Worst Pain Level: 10 Least Pain Level: 0 Tolerable Pain Level: 3 Pain Management and Medication Current Pain Management: Electronic Signature(s) Signed: 05/24/2022 4:00:21 PM By: Worthy [name] Entered By: Worthy [name] on 05/24/2022 15:16:28 -------------------------------------------------------------------------------- Patient/Caregiver Education Details Patient Name: Date of Service: [name] Corinda Gubler [name]. 2/21/2024andnbsp3:30 PM Medical Record Number: [id number] Patient Account Number: [id number] Date of Birth/Gender: Treating RN: [birth year] (77 y.o. [name] Primary Care Physician: [name] [name] Other Clinician: Referring Physician: Treating  Physician/Extender: [name] [name] in Treatment: 61 Education Assessment [place], [place] [name] ([id number]) [id number] Page 6 of 9 Education Provided To: Patient Education Topics Provided Venous: Methods: Explain/Verbal Responses: Reinforcements needed, State content correctly Wound/Skin Impairment: Methods: Explain/Verbal Responses: Reinforcements needed, State content correctly Electronic Signature(s) Signed: 05/24/2022 5:50:47 PM By: [name] Gouty RN, BSN Entered By: [name] [name] on 05/24/2022 15:53:11 -------------------------------------------------------------------------------- Wound Assessment Details Patient Name: Date of Service: [name] [name] [name], Kaycee [name]. 05/24/2022 3:30 PM Medical Record Number: [id number] Patient Account Number: [id number] Date of Birth/Sex: Treating RN: 1945/04/19 (77 y.o. [name] Primary Care Gopal Malter: [name] [name] Other Clinician: Referring Jacob Cicero: Treating Tyarra Nolton/Extender: [name] [name] in Treatment: 14 Wound Status Wound Number: 8 Primary Lymphedema Etiology: Wound Location: Right, Medial Lower Leg Wound Open Wounding Event: Blister Status: Date Acquired: 05/02/2022 Comorbid Anemia, Lymphedema, Sleep Apnea, Arrhythmia, Congestive Heart Weeks Of Treatment: 3 History: Failure, Hypertension, Type II Diabetes, Neuropathy Clustered Wound: No Photos Wound Measurements Length: (cm) Width: (cm) Depth: (cm) Area: (cm) Volume: (cm) 0 % Reduction in Area: 100% 0 % Reduction in Volume: 100% 0 Epithelialization: Large (67-100%) 0 Tunneling: No 0 Undermining: No Wound Description Classification: Full Thickness Without Exposed Support Exudate Amount: None Present Structures Foul Odor After Cleansing: No Slough/Fibrino Yes Wound Bed Granulation Amount: None Present (0%) Exposed Structure Necrotic Amount: None Present (0%) Fascia Exposed: No Fat Layer  (Subcutaneous Tissue) Exposed: No Tendon Exposed: No Caruthers, [name] [name] ([id number] Page 7 of 9 Muscle Exposed: No Joint Exposed: No Bone Exposed: No Periwound Skin Texture Texture Color No Abnormalities Noted: Yes No Abnormalities Noted: No Erythema: No Moisture Hemosiderin Staining: Yes No Abnormalities Noted: Yes Rubor: No Temperature / Pain Temperature: No Abnormality Electronic Signature(s) Signed: 05/24/2022 4:00:21 PM By: Worthy [name] Signed: 05/24/2022 5:50:47 PM By: [name] Gouty RN, BSN Entered By: Worthy [name] on 05/24/2022 15:39:44 -------------------------------------------------------------------------------- Wound Assessment Details  Patient Name: Date of Service: DAL, FLAUGH [name]. 05/24/2022 3:30 PM Medical Record Number: [id number] Patient Account Number: [id number] Date of Birth/Sex: Treating RN: [birth year] (77 y.o. [name] Primary Care Royalty Fakhouri: [name] [name] Other Clinician: Referring Kordell Jafri: Treating Jomo Forand/Extender: [name] [name] in Treatment: 14 Wound Status Wound Number: 9 Primary Lymphedema Etiology: Wound Location: Left, Anterior Lower Leg Wound Open Wounding Event: Gradually Appeared Status: Date Acquired: 05/13/2022 Comorbid Anemia, Lymphedema, Sleep Apnea, Arrhythmia, Congestive Heart Weeks Of Treatment: 1 History: Failure, Hypertension, Type II Diabetes, Neuropathy Clustered Wound: Yes Wound Measurements Length: (cm) 3.4 Width: (cm) 0.8 Depth: (cm) 0.1 Area: (cm) 2.136 Volume: (cm) 0.214 % Reduction in Area: 94% % Reduction in Volume: 94% Epithelialization: Large (67-100%) Tunneling: No Undermining: No Wound Description Classification: Full Thickness Without Exposed Suppor Wound Margin: Flat and Intact Exudate Amount: Medium Exudate Type: Serous Exudate Color: amber t Structures Foul Odor After Cleansing: No Slough/Fibrino Yes Wound Bed Granulation  Amount: Large (67-100%) Exposed Structure Granulation Quality: Red Fascia Exposed: No Necrotic Amount: Small (1-33%) Fat Layer (Subcutaneous Tissue) Exposed: No Necrotic Quality: Eschar Tendon Exposed: No Muscle Exposed: No Joint Exposed: No Bone Exposed: No Limited to Skin Breakdown Periwound Skin Texture Texture Color No Abnormalities Noted: No No Abnormalities Noted: No Callus: No Atrophie Blanche: No Crepitus: No Cyanosis: No Excoriation: No Ecchymosis: No [name], [name] [name] ([id number] Page 8 of 9 Induration: No Erythema: No Rash: No Hemosiderin Staining: Yes Scarring: No Mottled: No Pallor: No Moisture Rubor: No No Abnormalities Noted: Yes Temperature / Pain Temperature: No Abnormality Electronic Signature(s) Signed: 05/24/2022 5:50:47 PM By: [name] Gouty RN, BSN Entered By: [name] [name] on 05/24/2022 15:38:40 -------------------------------------------------------------------------------- Wound Assessment Details Patient Name: Date of Service: [name] [name] [name], [name] [name]. 05/24/2022 3:30 PM Medical Record Number: [id number] Patient Account Number: [id number] Date of Birth/Sex: Treating RN: Jun 26, 1945 (77 y.o. [name] Primary Care Keylie Beavers: [name] [name] Other Clinician: Referring Terion Hedman: Treating Grettel Rames/Extender: [name] [name] in Treatment: 14 Wound Status Wound Number: 9 Primary Lymphedema Etiology: Wound Location: Left, Anterior Lower Leg Wound Open Wounding Event: Gradually Appeared Status: Date Acquired: 05/13/2022 Comorbid Anemia, Lymphedema, Sleep Apnea, Arrhythmia, Congestive Heart Weeks Of Treatment: 1 History: Failure, Hypertension, Type II Diabetes, Neuropathy Clustered Wound: Yes Photos Wound Measurements Length: (cm) 3.4 Width: (cm) 0.8 Depth: (cm) 0.1 Area: (cm) 2.136 Volume: (cm) 0.214 % Reduction in Area: 94% % Reduction in Volume: 94% Wound  Description Classification: Full Thickness Without Exposed Suppor Exudate Amount: Medium Exudate Type: Serosanguineous Exudate Color: red, brown t Structures Periwound Skin Texture Texture Color No Abnormalities Noted: No No Abnormalities Noted: No Moisture No Abnormalities Noted: No Treatment Notes H[place]ala, [name] [name] ([id number] Page 9 of 9 Wound #9 (Lower Leg) Wound Laterality: Left, Anterior Cleanser Soap and Water Discharge Instruction: May shower and wash wound with dial antibacterial soap and water prior to dressing change. Wound Cleanser Discharge Instruction: Cleanse the wound with wound cleanser prior to applying [name] clean dressing using gauze sponges, not tissue or cotton balls. Peri-Wound Care Sween Lotion (Moisturizing lotion) Discharge Instruction: Apply moisturizing lotion as directed Topical Primary Dressing Sorbalgon AG Dressing 2x2 (in/in) Discharge Instruction: Apply to wound bed as instructed Secondary Dressing Woven Gauze Sponge, Non-Sterile 4x4 in Discharge Instruction: Apply over primary dressing as directed. Secured With Compression Wrap FourPress (4 layer compression wrap) Discharge Instruction: Apply four layer compression as directed. May also use Urgo K2 compression system as alternative. Compression Stockings Add-Ons Electronic Signature(s) Signed: 05/24/2022 4:00:21 PM By:  Worthy [name] Signed: 05/24/2022 5:50:47 PM By: [name] Gouty RN, BSN Entered By: Worthy [name] on 05/24/2022 15:39:14 -------------------------------------------------------------------------------- Vitals Details Patient Name: Date of Service: [name] [name] [name], [name] [name]. 05/24/2022 3:30 PM Medical Record Number: [id number] Patient Account Number: [id number] Date of Birth/Sex: Treating RN: 09/19/1945 (76 y.o. M) Primary Care Valita Righter: [name] [name] Other Clinician: Referring [place]o [place]: Treating Lillyen Schow/Extender: [name] [name] in Treatment: 14 Vital Signs Time Taken: 03:15 Temperature (F): 98.2 Height (in): 72 Pulse (bpm): 68 Weight (lbs): 375 Respiratory Rate (breaths/min): 18 Body Mass Index (BMI): 50.9 Blood Pressure (mmHg): 121/63 Reference Range: 80 - 120 mg / dl Electronic Signature(s) Signed: 05/24/2022 4:00:21 PM By: Worthy [name] Entered By: Worthy [name] on 05/24/2022 15:16:00

## 2022-05-25 NOTE — Progress Notes (Signed)
WITTLER, Elio [name] ([id number]) [id number] Page 1 of 10 Visit Report for 05/24/2022 Chief Complaint Document Details Patient Name: Date of Service: [name], [name]. 05/24/2022 3:30 PM Medical Record Number: [id number] Patient Account Number: [id number] Date of Birth/Sex: Treating RN: Mar 11, 1946 (77 y.o. M) Primary Care Provider: [name] Other Clinician: Referring Provider: Treating Provider/Extender: [name] in Treatment: 14 Information Obtained from: Patient Chief Complaint Patients presents for treatment of open ulcers secondary to lymphedema and diabetes Electronic Signature(s) Signed: 05/24/2022 4:06:25 PM By: [name] MD FACS Entered By: [name] on 05/24/2022 16:06:25 -------------------------------------------------------------------------------- Debridement Details Patient Name: Date of Service: [name] [name], [name]. 05/24/2022 3:30 PM Medical Record Number: [id number] Patient Account Number: [id number] Date of Birth/Sex: Treating RN: 10-17-1945 (76 y.o. [name] Primary Care Provider: [name] Other Clinician: Referring Provider: Treating Provider/Extender: [name] in Treatment: 14 Debridement Performed for Assessment: Wound #9 Left,Anterior Lower Leg Performed By: Physician [name], MD Debridement Type: Debridement Level of Consciousness (Pre-procedure): Awake and Alert Pre-procedure Verification/Time Out Yes - 15:45 Taken: Start Time: 15:47 Pain Control: Lidocaine 4% T opical Solution T Area Debrided (L x W): otal 0.5 (cm) x 1 (cm) = 0.5 (cm) Tissue and other material debrided: Non-Viable, Eschar Level: Non-Viable Tissue Debridement Description: Selective/Open Wound Instrument: Curette Bleeding: Minimum Hemostasis Achieved: Pressure Procedural Pain: 0 Post Procedural Pain: 0 Response to Treatment: Procedure was tolerated  well Level of Consciousness (Post- Awake and Alert procedure): Post Debridement Measurements of Total Wound Length: (cm) 3.4 Width: (cm) 0.8 Depth: (cm) 0.1 Volume: (cm) 0.214 Character of Wound/Ulcer Post Debridement: Improved Post Procedure Diagnosis Same as Pre-procedure Whitsel, [name] ([id number].pdf Page 2 of 10 Notes Scribed for Dr. Celine Ahr by Baruch Gouty, RN Electronic Signature(s) Signed: 05/24/2022 4:24:31 PM By: [name] MD FACS Signed: 05/24/2022 5:50:47 PM By: Baruch Gouty RN, BSN Entered By: Baruch Gouty on 05/24/2022 15:52:31 -------------------------------------------------------------------------------- HPI Details Patient Name: Date of Service: [name], [name]. 05/24/2022 3:30 PM Medical Record Number: [id number] Patient Account Number: [id number] Date of Birth/Sex: Treating RN: [birth year] (77 y.o. M) Primary Care Provider: [name] Other Clinician: Referring Provider: Treating Provider/Extender: [name] in Treatment: 14 History of Present Illness HPI Description: ADMISSION 02/13/2022 This is [name] 77 year old morbidly obese type II diabetic (last hemoglobin A1c 6.6%) who presented to his primary care provider's office on November 7 with an ulcer on the top of his foot. He said it had been there for about [name] week. He is insensate in his feet and his wife had been caring for it. His PCP took [name] swab from the wound which grew back strep. [name] was prescribed and he was referred to the wound care center for further evaluation and management. In addition to diabetes, he also has pulmonary hypertension and diastolic congestive heart failure. 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 He has been seen at the voice and swallowing disorders clinic in [place] and no further intervention has been felt necessary. On exam, he has  severe, at least stage II, lymphedema. In addition to the dorsal foot wound, he has ulcers on both lower legs. He has [name] left anterior lower leg, [name] right lateral lower leg, and [name] right anterior lower leg wound. ABIs in clinic were noncompressible. 02/22/2022: The right lateral leg wound has closed. The other wounds are about the same size to slightly smaller. All  of them have [name] thick layer of rubbery slough on the surface. Edema control is still suboptimal. 03/02/2022: The left anterior leg wound has deteriorated. His leg is more swollen and red. There is thick slough accumulation on the surface. The right dorsal foot wound is smaller, but also has substantial slough buildup. 03/09/2022: The PCR culture that I took last week was positive for Pseudomonas. Levofloxacin was prescribed and he has [name] few days left of this. His leg looks better and the wound is cleaner. The right dorsal foot wound has [name] very thick layer of slough on the surface. Both wounds are smaller. 03/30/2022: All of the wounds are smaller today and very superficial. The dorsal foot wound on the right is nearly closed. There is just some slough and eschar present. 04/10/2022: The right dorsal foot wound has healed. Unfortunately, his right-sided wrap slid and above this, he developed significant swelling and now has blisters and several new open wounds with slough accumulation. The left anterior leg wound is small with slough and eschar buildup. 04/25/2022: The wounds on his right leg have healed. The wound on his left leg is down to [name] pinhole underneath some eschar. He attempted to cut his own nails and nicked his great toe, creating [name] new wound. It is fairly superficial. 05/02/2022: His left leg wound and left great toe wound have healed. Unfortunately, he was out of his compression and developed [name] new wound on his right leg. It is superficial and actually looks more like [name] scratch than anything else. Edema control on the left is quite  good, while on the right it is rather abysmal. 05/17/2022: He missed his appointment last week due to transportation issues. He has [name] new wound on his left anterior lower leg. Despite taking his diuretics and wearing his compression wraps, his edema control is inadequate. 05/24/2022: The wounds on his right leg are healed. The wounds on his left leg are small and superficial with just [name] little eschar around the edges. He has not heard anything regarding his lymphedema pumps. Electronic Signature(s) Signed: 05/24/2022 4:07:04 PM By: [name] MD FACS Entered By: [name] on 05/24/2022 16:07:04 Physical Exam Details -------------------------------------------------------------------------------- [name] ([id number]) [id number] Page 3 of 10 Patient Name: Date of Service: [name], [name]. 05/24/2022 3:30 PM Medical Record Number: [id number] Patient Account Number: [id number] Date of Birth/Sex: Treating RN: Jan 21, 1946 (77 y.o. M) Primary Care Provider: [name] Other Clinician: Referring Provider: Treating Provider/Extender: [name] in Treatment: 14 Constitutional . . . . no acute distress. Respiratory Normal work of breathing on supplemental oxygen. Notes 05/24/2022: The wounds on his right leg are healed. The wounds on his left leg are small and superficial with just [name] little eschar around the edges. Electronic Signature(s) Signed: 05/24/2022 4:08:31 PM By: [name] MD FACS Entered By: [name] on 05/24/2022 16:08:31 -------------------------------------------------------------------------------- Physician Orders Details Patient Name: Date of Service: [name], [name]. 05/24/2022 3:30 PM Medical Record Number: [id number] Patient Account Number: [id number] Date of Birth/Sex: Treating RN: 01/17/1946 (77 y.o. [name] Primary Care Provider: [name] Other  Clinician: Referring Provider: Treating Provider/Extender: [name] in Treatment: [phone] Verbal / Phone Orders: No Diagnosis Coding ICD-10 Coding Code Description [phone] 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ity I50.30 Unspecified diastolic (congestive) heart failure J96.11 Chronic respiratory failure with hypoxia E66.01 Morbid (severe) obesity due to excess calories I89.0 Lymphedema, not elsewhere classified  Follow-up Appointments ppointment in 1 week. - Dr. Celine Ahr - Room 3 Return [name] Wed 2/28 @ 3:45 pm Anesthetic (In clinic) Topical Lidocaine 4% applied to wound bed Bathing/ Shower/ Hygiene May shower with protection but do not get wound dressing(s) wet. Protect dressing(s) with water repellant cover (for example, large plastic bag) or [name] cast cover and may then take shower. Edema Control - Lymphedema / SCD / Other Lymphedema Pumps. Use Lymphedema pumps on leg(s) 2-3 times [name] day for 45-60 minutes. If wearing any wraps or hose, do not remove them. Continue exercising as instructed. - once available Elevate legs to the level of the heart or above for 30 minutes daily and/or when sitting for 3-4 times [name] day throughout the day. - use [name] wedge pillow for legs while in bed Avoid standing for long periods of time. Patient to wear own compression stockings every day. - farrow wrap to right leg when not wearing compression wraps Exercise regularly Wound Treatment Wound #9 - Lower Leg Wound Laterality: Left, Anterior Cleanser: Soap and Water 1 x Per Week/10 Days Discharge Instructions: May shower and wash wound with dial antibacterial soap and water prior to dressing change. Cleanser: Wound Cleanser 1 x Per Week/10 Days Mccombs, Berdell [name] ([id number]) [phone].pdf Page 4 of 10 Discharge Instructions: Cleanse the wound with wound cleanser prior to applying [name] clean dressing using gauze sponges, not  tissue or cotton balls. Peri-Wound Care: Sween Lotion (Moisturizing lotion) 1 x Per Week/10 Days Discharge Instructions: Apply moisturizing lotion as directed Prim Dressing: Sorbalgon AG Dressing 2x2 (in/in) 1 x Per Week/10 Days ary Discharge Instructions: Apply to wound bed as instructed Secondary Dressing: Woven Gauze Sponge, Non-Sterile 4x4 in 1 x Per Week/10 Days Discharge Instructions: Apply over primary dressing as directed. Compression Wrap: FourPress (4 layer compression wrap) 1 x Per Week/10 Days Discharge Instructions: Apply four layer compression as directed. May also use Urgo K2 compression system as alternative. Electronic Signature(s) Signed: 05/24/2022 4:24:31 PM By: [name] MD FACS Entered By: [name] on 05/24/2022 16:08:48 -------------------------------------------------------------------------------- Problem List Details Patient Name: Date of Service: [name], [name]. 05/24/2022 3:30 PM Medical Record Number: [id number] Patient Account Number: [id number] Date of Birth/Sex: Treating RN: 09-Mar-1946 (77 y.o. [name] Primary Care Provider: [name] Other Clinician: Referring Provider: Treating Provider/Extender: [name] in Treatment: 14 Active Problems ICD-10 Encounter Code Description Active Date MDM Diagnosis [phone] Non-pressure chronic ulcer of other part of left lower leg with unspecified 05/17/2022 No Yes severity I50.30 Unspecified diastolic (congestive) heart failure 02/13/2022 No Yes J96.11 Chronic respiratory failure with hypoxia 02/13/2022 No Yes E66.01 Morbid (severe) obesity due to excess calories 02/13/2022 No Yes I89.0 Lymphedema, not elsewhere classified 02/13/2022 No Yes Inactive Problems ICD-10 Code Description Active Date Inactive Date L97.512 Non-pressure chronic ulcer of other part of right foot with fat layer exposed 02/13/2022 02/13/2022 Resolved Problems ICD-10 Foucher,  Sevag [name] ([id number]) [id number] Page 5 of 10 Code Description Active Date Resolved Date L97.812 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d 02/13/2022 02/13/2022 [id number] Non-pressure chronic ulcer of other part of left lower leg with fat layer exposed 02/13/2022 02/13/2022 L97.511 Non-pressure chronic ulcer of other part of right foot limited to breakdown of skin 04/25/2022 04/25/2022 L03.115 Cellulitis of right lower limb 02/13/2022 02/13/2022 [id number] Type 2 diabetes mellitus with foot ulcer 02/13/2022 02/13/2022 L97.811 Non-pressure chronic ulcer of other part of right lower leg limited to breakdown of skin 05/02/2022 05/02/2022 Electronic  Signature(s) Signed: 05/24/2022 4:06:10 PM By: [name] MD FACS Entered By: [name] on 05/24/2022 [id number] -------------------------------------------------------------------------------- Progress Note Details Patient Name: Date of Service: [name], [name]. 05/24/2022 3:30 PM Medical Record Number: [id number] Patient Account Number: [id number] Date of Birth/Sex: Treating RN: 1945-04-07 (77 y.o. M) Primary Care Provider: [name] Other Clinician: Referring Provider: Treating Provider/Extender: [name] in Treatment: 22 Subjective Chief Complaint Information obtained from Patient Patients presents for treatment of open ulcers secondary to lymphedema and diabetes History of Present Illness (HPI) ADMISSION 02/13/2022 This is [name] 77 year old morbidly obese type II diabetic (last hemoglobin A1c 6.6%) who presented to his primary care provider's office on November 7 with an ulcer on the top of his foot. He said it had been there for about [name] week. He is insensate in his feet and his wife had been caring for it. His PCP took [name] swab from the wound which grew back strep. [name] was prescribed and he was referred to the wound care center for  further evaluation and management. In addition to diabetes, he also has pulmonary hypertension and diastolic congestive heart failure. 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 He has been seen at the voice and swallowing disorders clinic in [place] and no further intervention has been felt necessary. On exam, he has severe, at least stage II, lymphedema. In addition to the dorsal foot wound, he has ulcers on both lower legs. He has [name] left anterior lower leg, [name] right lateral lower leg, and [name] right anterior lower leg wound. ABIs in clinic were noncompressible. 02/22/2022: The right lateral leg wound has closed. The other wounds are about the same size to slightly smaller. All of them have [name] thick layer of rubbery slough on the surface. Edema control is still suboptimal. 03/02/2022: The left anterior leg wound has deteriorated. His leg is more swollen and red. There is thick slough accumulation on the surface. The right dorsal foot wound is smaller, but also has substantial slough buildup. 03/09/2022: The PCR culture that I took last week was positive for Pseudomonas. Levofloxacin was prescribed and he has [name] few days left of this. His leg looks better and the wound is cleaner. The right dorsal foot wound has [name] very thick layer of slough on the surface. Both wounds are smaller. 03/30/2022: All of the wounds are smaller today and very superficial. The dorsal foot wound on the right is nearly closed. There is just some slough and eschar present. 04/10/2022: The right dorsal foot wound has healed. Unfortunately, his right-sided wrap slid and above this, he developed significant swelling and now has blisters and several new open wounds with slough accumulation. The left anterior leg wound is small with slough and eschar buildup. 04/25/2022: The wounds on his right leg have healed. The wound on his left leg is down to [name] pinhole underneath some eschar. He  attempted to cut his own nails and nicked his great toe, creating [name] new wound. It is fairly superficial. 05/02/2022: His left leg wound and left great toe wound have healed. Unfortunately, he was out of his compression and developed [name] new wound on his right leg. It is superficial and actually looks more like [name] scratch than anything else. Edema control on the left is quite good, while on the right it is rather abysmal. [name], [name] ([id number]) [phone].pdf Page 6 of 10 05/17/2022: He missed his appointment last week  due to transportation issues. He has [name] new wound on his left anterior lower leg. Despite taking his diuretics and wearing his compression wraps, his edema control is inadequate. 05/24/2022: The wounds on his right leg are healed. The wounds on his left leg are small and superficial with just [name] little eschar around the edges. He has not heard anything regarding his lymphedema pumps. Patient History Information obtained from Patient. Family History Cancer - Paternal Grandparents, Heart Disease - Maternal Grandparents,Father, No family history of Diabetes, Hereditary Spherocytosis, Hypertension, Kidney Disease, Lung Disease, Seizures, Stroke, Thyroid Problems, Tuberculosis. Social History Former smoker - smoked as [name] teen, Marital Status - Married, Alcohol Use - Never, Drug Use - No History, Caffeine Use - Never. Medical History Hematologic/Lymphatic Patient has history of Anemia, Lymphedema Respiratory Patient has history of Sleep Apnea Cardiovascular Patient has history of Arrhythmia - PAF, Congestive Heart Failure - diastolic, Hypertension Endocrine Patient has history of Type II Diabetes - takes no meds, "borderline A1c" Neurologic Patient has history of Neuropathy Oncologic Denies history of Received Chemotherapy, Received Radiation Hospitalization/Surgery History - right and left heart cath. - colonoscopy. - esophagogastroduodenoscopy. -  subxyphoid pericardial window. - intraoperative transesophageal echocardiogram. - laparoscopic gastric banding with hiatal hernia repair. - breath tek h pylori. - cholecysectomy. - arthroscopy knee w/ drilling. - lithotripsy. - tonsillectomy. - total knee replacement. - trigger finger repair. - wisdom tooth extraction. Medical [name] Surgical History Notes nd Constitutional Symptoms (General Health) obsesity Hematologic/Lymphatic chronic anticoagulation Cardiovascular pericarditis Gastrointestinal pt says 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 but they could not prove it Genitourinary BPH Neurologic 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, embolic cerebral infarction, CVA Psychiatric depression Objective Constitutional no acute distress. Vitals Time Taken: 3:15 AM, Height: 72 in, Weight: 375 lbs, BMI: 50.9, Temperature: 98.2 F, Pulse: 68 bpm, Respiratory Rate: 18 breaths/min, Blood Pressure: 121/63 mmHg. Respiratory Normal work of breathing on supplemental oxygen. General Notes: 05/24/2022: The wounds on his right leg are healed. The wounds on his left leg are small and superficial with just [name] little eschar around the edges. Integumentary (Hair, Skin) Wound #8 status is Open. Original cause of wound was Blister. The date acquired was: 05/02/2022. The wound has been in treatment 3 weeks. The wound is located on the Right,Medial Lower Leg. The wound measures 0cm length x 0cm width x 0cm depth; 0cm^2 area and 0cm^3 volume. There is no tunneling or undermining noted. There is [name] none present amount of drainage noted. There is no granulation within the wound bed. There is no necrotic tissue within the wound bed. The periwound skin appearance had no abnormalities noted for texture. The periwound skin appearance had no abnormalities noted for moisture. The periwound skin appearance exhibited: Hemosiderin Staining. The periwound skin appearance did not exhibit: Rubor, Erythema. Periwound temperature  was noted as No Abnormality. Wound #9 status is Open. Original cause of wound was Gradually Appeared. The date acquired was: 05/13/2022. The wound has been in treatment 1 weeks. The wound is located on the Left,Anterior Lower Leg. The wound measures 3.4cm length x 0.8cm width x 0.1cm depth; 2.136cm^2 area and 0.214cm^3 volume. The wound is limited to skin breakdown. There is no tunneling or undermining noted. There is [name] medium amount of serous drainage noted. The wound margin is flat and intact. There is large (67-100%) red granulation within the wound bed. There is [name] small (1-33%) amount of necrotic tissue within the wound bed including Eschar. The periwound skin appearance had no abnormalities noted for moisture.  The periwound skin appearance exhibited: Hemosiderin Staining. The periwound skin appearance did not exhibit: Callus, Crepitus, Excoriation, Induration, Rash, Scarring, Atrophie Blanche, Cyanosis, Ecchymosis, Mottled, Pallor, Rubor, Erythema. Periwound temperature was noted as No Abnormality. [name], [name] ([id number]) [id number] Page 7 of 10 Wound #9 status is Open. Original cause of wound was Gradually Appeared. The date acquired was: 05/13/2022. The wound has been in treatment 1 weeks. The wound is located on the Left,Anterior Lower Leg. The wound measures 3.4cm length x 0.8cm width x 0.1cm depth; 2.136cm^2 area and 0.214cm^3 volume. There is [name] medium amount of serosanguineous drainage noted. Assessment Active Problems ICD-10 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ity Unspecified diastolic (congestive) heart failure Chronic respiratory failure with hypoxia Morbid (severe) obesity due to excess calories Lymphedema, not elsewhere classified Procedures Wound #9 Pre-procedure diagnosis of Wound #9 is [name] Lymphedema located on the Left,Anterior Lower Leg . There was [name] Selective/Open Wound Non-Viable  Tissue Debridement with [name] total area of 0.5 sq cm performed by [name], MD. With the following instrument(s): Curette to remove Non-Viable tissue/material. Material removed includes Eschar after achieving pain control using Lidocaine 4% Topical Solution. No specimens were taken. [name] time out was conducted at 15:45, prior to the start of the procedure. [name] Minimum amount of bleeding was controlled with Pressure. The procedure was tolerated well with [name] pain level of 0 throughout and [name] pain level of 0 following the procedure. Post Debridement Measurements: 3.4cm length x 0.8cm width x 0.1cm depth; 0.214cm^3 volume. Character of Wound/Ulcer Post Debridement is improved. Post procedure Diagnosis Wound #9: Same as Pre-Procedure General Notes: Scribed for Dr. Celine Ahr by Baruch Gouty, RN. Pre-procedure diagnosis of Wound #9 is [name] Lymphedema located on the Left,Anterior Lower Leg . There was [name] Four Layer Compression Therapy Procedure by Baruch Gouty, RN. Post procedure Diagnosis Wound #9: Same as Pre-Procedure Plan Follow-up Appointments: Return Appointment in 1 week. - Dr. Celine Ahr - Room 3 Wed 2/28 @ 3:45 pm Anesthetic: (In clinic) Topical Lidocaine 4% applied to wound bed Bathing/ Shower/ Hygiene: May shower with protection but do not get wound dressing(s) wet. Protect dressing(s) with water repellant cover (for example, large plastic bag) or [name] cast cover and may then take shower. Edema Control - Lymphedema / SCD / Other: Lymphedema Pumps. Use Lymphedema pumps on leg(s) 2-3 times [name] day for 45-60 minutes. If wearing any wraps or hose, do not remove them. Continue exercising as instructed. - once available Elevate legs to the level of the heart or above for 30 minutes daily and/or when sitting for 3-4 times [name] day throughout the day. - use [name] wedge pillow for legs while in bed Avoid standing for long periods of time. Patient to wear own compression stockings every day. - farrow wrap to right  leg when not wearing compression wraps Exercise regularly WOUND #9: - Lower Leg Wound Laterality: Left, Anterior Cleanser: Soap and Water 1 x Per Week/10 Days Discharge Instructions: May shower and wash wound with dial antibacterial soap and water prior to dressing change. Cleanser: Wound Cleanser 1 x Per Week/10 Days Discharge Instructions: Cleanse the wound with wound cleanser prior to applying [name] clean dressing using gauze sponges, not tissue or cotton balls. Peri-Wound Care: Sween Lotion (Moisturizing lotion) 1 x Per Week/10 Days Discharge Instructions: Apply moisturizing lotion as directed Prim Dressing: Sorbalgon AG Dressing 2x2 (in/in) 1 x Per Week/10 Days ary Discharge Instructions: Apply to wound bed as instructed Secondary Dressing: Woven Gauze Sponge, Non-Sterile  4x4 in 1 x Per Week/10 Days Discharge Instructions: Apply over primary dressing as directed. Com pression Wrap: FourPress (4 layer compression wrap) 1 x Per Week/10 Days Discharge Instructions: Apply four layer compression as directed. May also use Urgo K2 compression system as alternative. 05/24/2022: The wounds on his right leg are healed. The wounds on his left leg are small and superficial with just [name] little eschar around the edges. I used [name] curette to debride the eschar from the wound edges. We will continue silver alginate and 4-layer compression. He brought his Wallie Char wraps with him today and we will apply this to his right leg. We will communicate with the lymphedema pump supplier to make sure that they get in contact with the patient. He will follow-up in 1 week. [name], [name] ([id number]) [id number] Page 8 of 10 Electronic Signature(s) Signed: 05/24/2022 4:09:53 PM By: [name] MD FACS Entered By: [name] on 05/24/2022 16:09:53 -------------------------------------------------------------------------------- HxROS Details Patient Name: Date of Service: [name],  Yousif [name]. 05/24/2022 3:30 PM Medical Record Number: [id number] Patient Account Number: [id number] Date of Birth/Sex: Treating RN: March 07, 1946 (77 y.o. M) Primary Care Provider: [name] Other Clinician: Referring Provider: Treating Provider/Extender: [name] in Treatment: 14 Information Obtained From Patient Constitutional Symptoms (General Health) Medical History: Past Medical History Notes: obsesity Hematologic/Lymphatic Medical History: Positive for: Anemia; Lymphedema Past Medical History Notes: chronic anticoagulation Respiratory Medical History: Positive for: Sleep Apnea Cardiovascular Medical History: Positive for: Arrhythmia - PAF; Congestive Heart Failure - diastolic; Hypertension Past Medical History Notes: pericarditis Gastrointestinal Medical History: Past Medical History Notes: pt says 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 but they could not prove it Endocrine Medical History: Positive for: Type II Diabetes - takes no meds, "borderline A1c" Time with diabetes: 2-3 years ago Blood sugar tested every day: No Genitourinary Medical History: Past Medical History Notes: BPH Neurologic Medical History: Positive for: Neuropathy Past Medical History Notes: 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, embolic cerebral infarction, CVA Oncologic Lantry, Ezzard [name] ([id number] Page 9 of 10 Medical History: Negative for: Received Chemotherapy; Received Radiation Psychiatric Medical History: Past Medical History Notes: depression Immunizations Pneumococcal Vaccine: Received Pneumococcal Vaccination: Yes Received Pneumococcal Vaccination On or After 60th Birthday: Yes Implantable Devices None Hospitalization / Surgery History Type of Hospitalization/Surgery right and left heart cath colonoscopy esophagogastroduodenoscopy subxyphoid pericardial window intraoperative transesophageal  echocardiogram laparoscopic gastric banding with hiatal hernia repair breath tek h pylori cholecysectomy arthroscopy knee w/ drilling lithotripsy tonsillectomy total knee replacement trigger finger repair wisdom tooth extraction Family and Social History Cancer: Yes - Paternal Grandparents; Diabetes: No; Heart Disease: Yes - Maternal Grandparents,Father; Hereditary Spherocytosis: No; Hypertension: No; Kidney Disease: No; Lung Disease: No; Seizures: No; Stroke: No; Thyroid Problems: No; Tuberculosis: No; Former smoker - smoked as [name] teen; Marital Status - Married; Alcohol Use: Never; Drug Use: No History; Caffeine Use: Never; Financial Concerns: No; Food, Clothing or Shelter Needs: No; Support System Lacking: No; Transportation Concerns: No Electronic Signature(s) Signed: 05/24/2022 4:24:31 PM By: [name] MD FACS Entered By: [name] on 05/24/2022 16:07:13 -------------------------------------------------------------------------------- SuperBill Details Patient Name: Date of Service: [name] [name], Eastin [name]. 05/24/2022 Medical Record Number: [id number] Patient Account Number: [id number] Date of Birth/Sex: Treating RN: 08-10-1945 (77 y.o. M) Primary Care Provider: [name] Other Clinician: Referring Provider: Treating Provider/Extender: [name] in Treatment: 14 Diagnosis Coding ICD-10 Codes Code Description [phone] 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ity I50.30  Unspecified diastolic (congestive) heart failure J96.11 Chronic respiratory failure with hypoxia E66.01 Morbid (severe) obesity due to excess calories I89.0 Lymphedema, not elsewhere classified Facility Procedures NOLEN, SIRACUSA [name] ([id number]): CPT4 Code Description [id number] - DEBRIDE WOUND 1ST 20 SQ CM OR < ICD-10 Diagnosis Description L97.829 Non-pressure chronic ulcer of other part of left lower leg with  u [id number] Page 10 of 10: Modifier Quantity 1 nspecified severity Physician Procedures : CPT4 Code Description Modifier [id number] - WC PHYS LEVEL 3 - EST PT 25 ICD-10 Diagnosis Description L97.829 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ity I89.0 Lymphedema, not elsewhere classified I50.30  Unspecified diastolic (congestive) heart failure E66.01 Morbid (severe) obesity due to excess calories Quantity: 1 : [id number] - WC PHYS DEBR WO ANESTH 20 SQ CM ICD-10 Diagnosis Description L97.829 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ity Quantity: 1 Electronic Signature(s) Signed: 05/24/2022 4:10:17 PM By: [name] MD FACS Entered By: [name] on 05/24/2022 16:10:17

## 2022-05-31 ENCOUNTER — Encounter (HOSPITAL_BASED_OUTPATIENT_CLINIC_OR_DEPARTMENT_OTHER): Payer: Medicare Other | Admitting: General Surgery

## 2022-06-05 ENCOUNTER — Encounter (HOSPITAL_BASED_OUTPATIENT_CLINIC_OR_DEPARTMENT_OTHER): Payer: Medicare Other | Attending: General Surgery | Admitting: General Surgery

## 2022-06-05 DIAGNOSIS — Z872 Personal history of diseases of the skin and subcutaneous tissue: Secondary | ICD-10-CM | POA: Insufficient documentation

## 2022-06-05 DIAGNOSIS — I110 Hypertensive heart disease with heart failure: Secondary | ICD-10-CM | POA: Insufficient documentation

## 2022-06-05 DIAGNOSIS — I890 Lymphedema, not elsewhere classified: Secondary | ICD-10-CM | POA: Diagnosis not present

## 2022-06-05 DIAGNOSIS — L97822 Non-pressure chronic ulcer of other part of left lower leg with fat layer exposed: Secondary | ICD-10-CM | POA: Diagnosis not present

## 2022-06-05 DIAGNOSIS — Z6841 Body Mass Index (BMI) 40.0 and over, adult: Secondary | ICD-10-CM | POA: Insufficient documentation

## 2022-06-05 DIAGNOSIS — J9611 Chronic respiratory failure with hypoxia: Secondary | ICD-10-CM | POA: Insufficient documentation

## 2022-06-05 DIAGNOSIS — Z09 Encounter for follow-up examination after completed treatment for conditions other than malignant neoplasm: Secondary | ICD-10-CM | POA: Insufficient documentation

## 2022-06-05 DIAGNOSIS — I5030 Unspecified diastolic (congestive) heart failure: Secondary | ICD-10-CM | POA: Insufficient documentation

## 2022-06-06 NOTE — Progress Notes (Signed)
MELILLO, [name] ([id number] Page 1 of 8 Visit Report for 06/05/2022 Chief Complaint Document Details Patient Name: Date of Service: [name], [name]. 06/05/2022 2:15 PM Medical Record Number: [id number] Patient Account Number: [id number] Date of Birth/Sex: Treating RN: [birth year] (77 y.o. M) Primary Care Provider: [name] Other Clinician: Referring Provider: Treating Provider/Extender: [name] in Treatment: 16 Information Obtained from: Patient Chief Complaint Patients presents for treatment of open ulcers secondary to lymphedema and diabetes Electronic Signature(s) Signed: 06/05/2022 3:12:33 PM By: [name] MD FACS Entered By: [name] on 06/05/2022 15:12:33 -------------------------------------------------------------------------------- HPI Details Patient Name: Date of Service: [name], [name]. 06/05/2022 2:15 PM Medical Record Number: [id number] Patient Account Number: [id number] Date of Birth/Sex: Treating RN: [birth year] (77 y.o. M) Primary Care Provider: [name] Other Clinician: Referring Provider: Treating Provider/Extender: [name] in Treatment: 16 History of Present Illness HPI Description: ADMISSION 02/13/2022 This is [name] 77 year old morbidly obese type II diabetic (last hemoglobin A1c 6.6%) who presented to his primary care provider's office on November 7 with an ulcer on the top of his foot. He said it had been there for about [name] week. He is insensate in his feet and his wife had been caring for it. His PCP took [name] swab from the wound which grew back strep. [name] was prescribed and he was referred to the wound care center for further evaluation and management. In addition to diabetes, he also has pulmonary hypertension and diastolic congestive heart failure. 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 He has been seen at the voice and swallowing disorders clinic in [place] and no further intervention has been felt necessary. On exam, he has severe, at least stage II, lymphedema. In addition to the dorsal foot wound, he has ulcers on both lower legs. He has [name] left anterior lower leg, [name] right lateral lower leg, and [name] right anterior lower leg wound. ABIs in clinic were noncompressible. 02/22/2022: The right lateral leg wound has closed. The other wounds are about the same size to slightly smaller. All of them have [name] thick layer of rubbery slough on the surface. Edema control is still suboptimal. 03/02/2022: The left anterior leg wound has deteriorated. His leg is more swollen and red. There is thick slough accumulation on the surface. The right dorsal foot wound is smaller, but also has substantial slough buildup. 03/09/2022: The PCR culture that I took last week was positive for Pseudomonas. Levofloxacin was prescribed and he has [name] few days left of this. His leg looks better and the wound is cleaner. The right dorsal foot wound has [name] very thick layer of slough on the surface. Both wounds are smaller. 03/30/2022: All of the wounds are smaller today and very superficial. The dorsal foot wound on the right is nearly closed. There is just some slough and eschar present. 04/10/2022: The right dorsal foot wound has healed. Unfortunately, his right-sided wrap slid and above this, he developed significant swelling and now has blisters and several new open wounds with slough accumulation. The left anterior leg wound is small with slough and eschar buildup. 04/25/2022: The wounds on his right leg have healed. The wound on his left leg is down to [name] pinhole underneath some eschar. He attempted to cut his own nails and nicked his great toe, creating [name] new wound. It is fairly superficial. 05/02/2022: His left leg wound and left great  toe wound have healed. Unfortunately, he was out of his compression and  developed [name] new wound on his right leg. It is superficial and actually looks more like [name] scratch than anything else. Edema control on the left is quite good, while on the right it is rather abysmal. 05/17/2022: He missed his appointment last week due to transportation issues. He has [name] new wound on his left anterior lower leg. Despite taking his diuretics and wearing his compression wraps, his edema control is inadequate. 05/24/2022: The wounds on his right leg are healed. The wounds on his left leg are small and superficial with just [name] little eschar around the edges. He has not heard anything regarding his lymphedema pumps. 06/05/2022: The wound on his left leg is very small and superficial. It is very clean. Electronic Signature(s) [name], [name] ([id number]) [id number] Page 2 of 8 Signed: 06/05/2022 3:13:06 PM By: [name] MD FACS Entered By: [name] on 06/05/2022 15:13:06 -------------------------------------------------------------------------------- Physical Exam Details Patient Name: Date of Service: [name], [name]. 06/05/2022 2:15 PM Medical Record Number: [id number] Patient Account Number: [id number] Date of Birth/Sex: Treating RN: 11-05-1945 (77 y.o. M) Primary Care Provider: [name] Other Clinician: Referring Provider: Treating Provider/Extender: [name] in Treatment: 16 Constitutional Hypertensive, asymptomatic. Slightly bradycardic. . . no acute distress. Respiratory Normal work of breathing on supplemental oxygen. Notes 06/05/2022: The wound on his left leg is very small and superficial. It is very clean. Electronic Signature(s) Signed: 06/05/2022 3:16:57 PM By: [name] MD FACS Previous Signature: 06/05/2022 3:14:28 PM Version By: [name] MD FACS Entered By: [name] on 06/05/2022  15:16:57 -------------------------------------------------------------------------------- Physician Orders Details Patient Name: Date of Service: [name], [name]. 06/05/2022 2:15 PM Medical Record Number: [id number] Patient Account Number: [id number] Date of Birth/Sex: Treating RN: 12/02/1945 (77 y.o. Collene Gobble Primary Care Provider: [name] Other Clinician: Referring Provider: Treating Provider/Extender: [name] in Treatment: 56 Verbal / Phone Orders: No Diagnosis Coding ICD-10 Coding Code Description [phone] 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ity I50.30 Unspecified diastolic (congestive) heart failure J96.11 Chronic respiratory failure with hypoxia E66.01 Morbid (severe) obesity due to excess calories I89.0 Lymphedema, not elsewhere classified Follow-up Appointments ppointment in 1 week. - Dr. Celine Ahr - Room 3 Return [name] Tuesday March 12th at [place] (In clinic) Topical Lidocaine 4% applied to wound bed Bathing/ Shower/ Hygiene May shower with protection but do not get wound dressing(s) wet. Protect dressing(s) with water repellant cover (for example, large plastic bag) or [name] cast cover and may then take shower. Edema Control - Lymphedema / SCD / Other Lymphedema Pumps. Use Lymphedema pumps on leg(s) 2-3 times [name] day for 45-60 minutes. If wearing any wraps or hose, do not remove them. Continue exercising as instructed. - once available Elevate legs to the level of the heart or above for 30 minutes daily and/or when sitting for 3-4 times [name] day throughout the day. - use [name] wedge pillow for legs while in bed Avoid standing for long periods of time. Patient to wear own compression stockings every day. - farrow wrap to right leg when not wearing compression wraps Exercise regularly Wound Treatment Wound #9 - Lower Leg Wound Laterality: Left, Anterior [place], Aurthur [name] ([id number].pdf Page 3 of 8 Cleanser: Soap and Water 1 x Per Week/10 Days Discharge Instructions: May shower and wash wound with dial antibacterial soap and water prior to dressing change.  Cleanser: Wound Cleanser 1 x Per Week/10 Days Discharge Instructions: Cleanse the wound with wound cleanser prior to applying [name] clean dressing using gauze sponges, not tissue or cotton balls. Peri-Wound Care: Sween Lotion (Moisturizing lotion) 1 x Per Week/10 Days Discharge Instructions: Apply moisturizing lotion as directed Prim Dressing: Sorbalgon AG Dressing 2x2 (in/in) 1 x Per Week/10 Days ary Discharge Instructions: Apply to wound bed as instructed Secondary Dressing: Woven Gauze Sponge, Non-Sterile 4x4 in 1 x Per Week/10 Days Discharge Instructions: Apply over primary dressing as directed. Compression Wrap: FourPress (4 layer compression wrap) 1 x Per Week/10 Days Discharge Instructions: Apply four layer compression as directed. May also use Urgo K2 compression system as alternative. Electronic Signature(s) Signed: 06/05/2022 5:08:25 PM By: [name] MD FACS Entered By: [name] on 06/05/2022 15:14:41 -------------------------------------------------------------------------------- Problem List Details Patient Name: Date of Service: [name], Jacoby [name]. 06/05/2022 2:15 PM Medical Record Number: [id number] Patient Account Number: [id number] Date of Birth/Sex: Treating RN: 1946/03/26 (77 y.o. M) Primary Care Provider: [name] Other Clinician: Referring Provider: Treating Provider/Extender: [name] in Treatment: 16 Active Problems ICD-10 Encounter Code Description Active Date MDM Diagnosis L97.829 Non-pressure chronic ulcer of other part of left lower leg with unspecified 05/17/2022 No Yes severity I50.30 Unspecified diastolic (congestive) heart failure 02/13/2022 No Yes J96.11 Chronic respiratory failure with hypoxia  02/13/2022 No Yes E66.01 Morbid (severe) obesity due to excess calories 02/13/2022 No Yes I89.0 Lymphedema, not elsewhere classified 02/13/2022 No Yes Inactive Problems ICD-10 Code Description Active Date Inactive Date L97.512 Non-pressure chronic ulcer of other part of right foot with fat layer exposed 02/13/2022 02/13/2022 Resolved Problems ICD-10 Vankirk, Asah [name] ([id number]) [phone].pdf Page 4 of 8 Code Description Active Date Resolved Date L97.812 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d 02/13/2022 02/13/2022 L97.811 Non-pressure chronic ulcer of other part of right lower leg limited to breakdown of skin 05/02/2022 05/02/2022 [id number] Non-pressure chronic ulcer of other part of left lower leg with fat layer exposed 02/13/2022 02/13/2022 L97.511 Non-pressure chronic ulcer of other part of right foot limited to breakdown of skin 04/25/2022 04/25/2022 L03.115 Cellulitis of right lower limb 02/13/2022 02/13/2022 [id number] Type 2 diabetes mellitus with foot ulcer 02/13/2022 02/13/2022 Electronic Signature(s) Signed: 06/05/2022 3:12:20 PM By: [name] MD FACS Entered By: [name] on 06/05/2022 15:12:20 -------------------------------------------------------------------------------- Progress Note Details Patient Name: Date of Service: [name], [name]. 06/05/2022 2:15 PM Medical Record Number: [id number] Patient Account Number: [id number] Date of Birth/Sex: Treating RN: Oct 16, 1945 (77 y.o. M) Primary Care Provider: [name] Other Clinician: Referring Provider: Treating Provider/Extender: [name] in Treatment: 16 Subjective Chief Complaint Information obtained from Patient Patients presents for treatment of open ulcers secondary to lymphedema and diabetes History of Present Illness (HPI) ADMISSION 02/13/2022 This is [name] 77 year old morbidly obese type II diabetic (last  hemoglobin A1c 6.6%) who presented to his primary care provider's office on November 7 with an ulcer on the top of his foot. He said it had been there for about [name] week. He is insensate in his feet and his wife had been caring for it. His PCP took [name] swab from the wound which grew back strep. [name] was prescribed and he was referred to the wound care center for further evaluation and management. In addition to diabetes, he also has pulmonary hypertension and diastolic congestive heart failure. 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 He has been seen at the voice and swallowing disorders clinic in [place] and no further intervention has been felt necessary. On exam, he has severe, at least stage II, lymphedema. In addition to the dorsal foot wound, he has ulcers on both lower legs. He has [name] left anterior lower leg, [name] right lateral lower leg, and [name] right anterior lower leg wound. ABIs in clinic were noncompressible. 02/22/2022: The right lateral leg wound has closed. The other wounds are about the same size to slightly smaller. All of them have [name] thick layer of rubbery slough on the surface. Edema control is still suboptimal. 03/02/2022: The left anterior leg wound has deteriorated. His leg is more swollen and red. There is thick slough accumulation on the surface. The right dorsal foot wound is smaller, but also has substantial slough buildup. 03/09/2022: The PCR culture that I took last week was positive for Pseudomonas. Levofloxacin was prescribed and he has [name] few days left of this. His leg looks better and the wound is cleaner. The right dorsal foot wound has [name] very thick layer of slough on the surface. Both wounds are smaller. 03/30/2022: All of the wounds are smaller today and very superficial. The dorsal foot wound on the right is nearly closed. There is just some slough and eschar present. 04/10/2022: The right dorsal foot wound has healed.  Unfortunately, his right-sided wrap slid and above this, he developed significant swelling and now has blisters and several new open wounds with slough accumulation. The left anterior leg wound is small with slough and eschar buildup. 04/25/2022: The wounds on his right leg have healed. The wound on his left leg is down to [name] pinhole underneath some eschar. He attempted to cut his own nails and nicked his great toe, creating [name] new wound. It is fairly superficial. 05/02/2022: His left leg wound and left great toe wound have healed. Unfortunately, he was out of his compression and developed [name] new wound on his right leg. It is superficial and actually looks more like [name] scratch than anything else. Edema control on the left is quite good, while on the right it is rather abysmal. 05/17/2022: He missed his appointment last week due to transportation issues. He has [name] new wound on his left anterior lower leg. Despite taking his diuretics and wearing his compression wraps, his edema control is inadequate. 05/24/2022: The wounds on his right leg are healed. The wounds on his left leg are small and superficial with just [name] little eschar around the edges. He has not heard anything regarding his lymphedema pumps. [name], [name] ([id number] Page 5 of 8 06/05/2022: The wound on his left leg is very small and superficial. It is very clean. Patient History Information obtained from Patient. Family History Cancer - Paternal Grandparents, Heart Disease - Maternal Grandparents,Father, No family history of Diabetes, Hereditary Spherocytosis, Hypertension, Kidney Disease, Lung Disease, Seizures, Stroke, Thyroid Problems, Tuberculosis. Social History Former smoker - smoked as [name] teen, Marital Status - Married, Alcohol Use - Never, Drug Use - No History, Caffeine Use - Never. Medical History Hematologic/Lymphatic Patient has history of Anemia, Lymphedema Respiratory Patient has  history of Sleep Apnea Cardiovascular Patient has history of Arrhythmia - PAF, Congestive Heart Failure - diastolic, Hypertension Endocrine Patient has history of Type II Diabetes - takes no meds, "borderline A1c" Neurologic Patient has history of Neuropathy Oncologic Denies history of Received Chemotherapy, Received Radiation Hospitalization/Surgery History - right and left heart cath. - colonoscopy. - esophagogastroduodenoscopy. - subxyphoid  pericardial window. - intraoperative transesophageal echocardiogram. - laparoscopic gastric banding with hiatal hernia repair. - breath tek h pylori. - cholecysectomy. - arthroscopy knee w/ drilling. - lithotripsy. - tonsillectomy. - total knee replacement. - trigger finger repair. - wisdom tooth extraction. Medical [name] Surgical History Notes nd Constitutional Symptoms (General Health) obsesity Hematologic/Lymphatic chronic anticoagulation Cardiovascular pericarditis Gastrointestinal pt says 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 but they could not prove it Genitourinary BPH Neurologic 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, embolic cerebral infarction, CVA Psychiatric depression Objective Constitutional Hypertensive, asymptomatic. Slightly bradycardic. no acute distress. Vitals Time Taken: 2:30 PM, Height: 72 in, Weight: 375 lbs, BMI: 50.9, Temperature: 98.6 F, Pulse: 56 bpm, Respiratory Rate: 20 breaths/min, Blood Pressure: 156/75 mmHg, Pulse Oximetry: 95 %. Respiratory Normal work of breathing on supplemental oxygen. General Notes: 06/05/2022: The wound on his left leg is very small and superficial. It is very clean. Integumentary (Hair, Skin) Wound #9 status is Open. Original cause of wound was Gradually Appeared. The date acquired was: 05/13/2022. The wound has been in treatment 2 weeks. The wound is located on the Left,Anterior Lower Leg. The wound measures 1.1cm length x 0.7cm width x 0.1cm depth; 0.605cm^2 area and 0.06cm^3 volume.  There is Fat Layer (Subcutaneous Tissue) exposed. There is no tunneling or undermining noted. There is [name] medium amount of serosanguineous drainage noted. There is large (67-100%) red, hyper - granulation within the wound bed. There is [name] small (1-33%) amount of necrotic tissue within the wound bed including Eschar. The periwound skin appearance had no abnormalities noted for moisture. The periwound skin appearance exhibited: Scarring, Hemosiderin Staining. Periwound temperature was noted as No Abnormality. Assessment Active Problems ICD-10 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ity Unspecified diastolic (congestive) heart failure [name], [name] ([id number] Page 6 of 8 Chronic respiratory failure with hypoxia Morbid (severe) obesity due to excess calories Lymphedema, not elsewhere classified Procedures Wound #9 Pre-procedure diagnosis of Wound #9 is [name] Lymphedema located on the Left,Anterior Lower Leg . There was [name] Four Layer Compression Therapy Procedure by Dellie Catholic, RN. Post procedure Diagnosis Wound #9: Same as Pre-Procedure Plan Follow-up Appointments: Return Appointment in 1 week. - Dr. Celine Ahr - Room 3 Tuesday March 12th at [facility] Anesthetic: (In clinic) Topical Lidocaine 4% applied to wound bed Bathing/ Shower/ Hygiene: May shower with protection but do not get wound dressing(s) wet. Protect dressing(s) with water repellant cover (for example, large plastic bag) or [name] cast cover and may then take shower. Edema Control - Lymphedema / SCD / Other: Lymphedema Pumps. Use Lymphedema pumps on leg(s) 2-3 times [name] day for 45-60 minutes. If wearing any wraps or hose, do not remove them. Continue exercising as instructed. - once available Elevate legs to the level of the heart or above for 30 minutes daily and/or when sitting for 3-4 times [name] day throughout the day. - use [name] wedge pillow for legs while in  bed Avoid standing for long periods of time. Patient to wear own compression stockings every day. - farrow wrap to right leg when not wearing compression wraps Exercise regularly WOUND #9: - Lower Leg Wound Laterality: Left, Anterior Cleanser: Soap and Water 1 x Per Week/10 Days Discharge Instructions: May shower and wash wound with dial antibacterial soap and water prior to dressing change. Cleanser: Wound Cleanser 1 x Per Week/10 Days Discharge Instructions: Cleanse the wound with wound cleanser prior to applying [name] clean dressing using gauze sponges, not tissue or cotton balls.  Peri-Wound Care: Sween Lotion (Moisturizing lotion) 1 x Per Week/10 Days Discharge Instructions: Apply moisturizing lotion as directed Prim Dressing: Sorbalgon AG Dressing 2x2 (in/in) 1 x Per Week/10 Days ary Discharge Instructions: Apply to wound bed as instructed Secondary Dressing: Woven Gauze Sponge, Non-Sterile 4x4 in 1 x Per Week/10 Days Discharge Instructions: Apply over primary dressing as directed. Com pression Wrap: FourPress (4 layer compression wrap) 1 x Per Week/10 Days Discharge Instructions: Apply four layer compression as directed. May also use Urgo K2 compression system as alternative. 06/05/2022: The wound on his left leg is very small and superficial. It is very clean. No debridement was necessary today. We will continue silver alginate and 3 layer compression. Follow-up in 1 week, at which time I anticipate he will likely be healed. He was reminded to bring his Wallie Char wrap with him for application should he be completely closed at that date. Electronic Signature(s) Signed: 06/05/2022 3:17:20 PM By: [name] MD FACS Previous Signature: 06/05/2022 3:16:26 PM Version By: [name] MD FACS Entered By: [name] on 06/05/2022 15:17:19 -------------------------------------------------------------------------------- HxROS Details Patient Name: Date of Service: [name], Geoffrey [name].  06/05/2022 2:15 PM Medical Record Number: [id number] Patient Account Number: [id number] Date of Birth/Sex: Treating RN: 1946/02/17 (77 y.o. M) Primary Care Provider: [name] Other Clinician: Referring Provider: Treating Provider/Extender: [name] in Treatment: [place] From Patient [name], [name] ([id number]) [id number] Page 7 of 8 Constitutional Symptoms (General Health) Medical History: Past Medical History Notes: obsesity Hematologic/Lymphatic Medical History: Positive for: Anemia; Lymphedema Past Medical History Notes: chronic anticoagulation Respiratory Medical History: Positive for: Sleep Apnea Cardiovascular Medical History: Positive for: Arrhythmia - PAF; Congestive Heart Failure - diastolic; Hypertension Past Medical History Notes: pericarditis Gastrointestinal Medical History: Past Medical History Notes: pt says 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 but they could not prove it Endocrine Medical History: Positive for: Type II Diabetes - takes no meds, "borderline A1c" Time with diabetes: 2-3 years ago Blood sugar tested every day: No Genitourinary Medical History: Past Medical History Notes: BPH Neurologic Medical History: Positive for: Neuropathy Past Medical History Notes: 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, embolic cerebral infarction, CVA Oncologic Medical History: Negative for: Received Chemotherapy; Received Radiation Psychiatric Medical History: Past Medical History Notes: depression Immunizations Pneumococcal Vaccine: Received Pneumococcal Vaccination: Yes Received Pneumococcal Vaccination On or After 60th Birthday: Yes Implantable Devices None Hospitalization / Surgery History Type of Hospitalization/Surgery right and left heart cath colonoscopy esophagogastroduodenoscopy subxyphoid pericardial window intraoperative transesophageal  echocardiogram laparoscopic gastric banding with hiatal hernia repair [name], [name] ([id number] Page 8 of 8 breath tek h pylori cholecysectomy arthroscopy knee w/ drilling lithotripsy tonsillectomy total knee replacement trigger finger repair wisdom tooth extraction Family and Social History Cancer: Yes - Paternal Grandparents; Diabetes: No; Heart Disease: Yes - Maternal Grandparents,Father; Hereditary Spherocytosis: No; Hypertension: No; Kidney Disease: No; Lung Disease: No; Seizures: No; Stroke: No; Thyroid Problems: No; Tuberculosis: No; Former smoker - smoked as [name] teen; Marital Status - Married; Alcohol Use: Never; Drug Use: No History; Caffeine Use: Never; Financial Concerns: No; Food, Clothing or Shelter Needs: No; Support System Lacking: No; Transportation Concerns: No Electronic Signature(s) Signed: 06/05/2022 5:08:25 PM By: [name] MD FACS Entered By: [name] on 06/05/2022 15:13:19 -------------------------------------------------------------------------------- SuperBill Details Patient Name: Date of Service: [name], Jalil [name]. 06/05/2022 Medical Record Number: [id number] Patient Account Number: [id number] Date of Birth/Sex: Treating RN: [birth year] (77 y.o. M) Primary Care Provider: [name] Other Clinician: Referring Provider:  Treating Provider/Extender: [name] in Treatment: 16 Diagnosis Coding ICD-10 Codes Code Description [phone] 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ity I50.30 Unspecified diastolic (congestive) heart failure J96.11 Chronic respiratory failure with hypoxia E66.01 Morbid (severe) obesity due to excess calories I89.0 Lymphedema, not elsewhere classified Physician Procedures : CPT4 Code Description Modifier [id number] - WC PHYS LEVEL 3 - EST PT ICD-10 Diagnosis Description L97.829 Non-pressure chronic ulcer of  other part of left lower leg with unspecified severity I50.30 Unspecified diastolic (congestive) heart failure  J96.11 Chronic respiratory failure with hypoxia I89.0 Lymphedema, not elsewhere classified Quantity: 1 Electronic Signature(s) Signed: 06/05/2022 3:17:51 PM By: [name] MD FACS Entered By: [name] on 06/05/2022 15:17:50

## 2022-06-07 NOTE — Progress Notes (Signed)
SWAPP, Newel [name] ([id number].pdf Page 1 of 7 Visit Report for 06/05/2022 Arrival Information Details Patient Name: Date of Service: [name], [name]. 06/05/2022 2:15 PM Medical Record Number: [id number] Patient Account Number: [id number] Date of Birth/Sex: Treating RN: 21-Jul-1945 (77 y.o. Collene Gobble Primary Care Kenneth Cuaresma: [name] Other Clinician: Referring Julena Barbour: Treating Jayon Matton/Extender: [name] in Treatment: 55 Visit Information History Since Last Visit Added or deleted any medications: No Patient Arrived: Wheel Chair Any new allergies or adverse reactions: No Arrival Time: 14:22 Had [name] fall or experienced change in No Accompanied By: self activities of daily living that may affect Transfer Assistance: Manual risk of falls: Patient Identification Verified: Yes Signs or symptoms of abuse/neglect since last visito No Patient Requires Transmission-Based Precautions: No Hospitalized since last visit: No Patient Has Alerts: Yes Implantable device outside of the clinic excluding No Patient Alerts: Patient on Blood Thinner cellular tissue based products placed in the center since last visit: Has Dressing in Place as Prescribed: Yes Has Compression in Place as Prescribed: Yes Pain Present Now: No Electronic Signature(s) Signed: 06/05/2022 6:24:12 PM By: Dellie Catholic RN Entered By: Dellie Catholic on 06/05/2022 14:29:35 -------------------------------------------------------------------------------- Compression Therapy Details Patient Name: Date of Service: [name], [name]. 06/05/2022 2:15 PM Medical Record Number: [id number] Patient Account Number: [id number] Date of Birth/Sex: Treating RN: [birth year] (77 y.o. Collene Gobble Primary Care Aveline Daus: [name] Other Clinician: Referring Phoenicia Pirie: Treating Skyanne Welle/Extender: [name] in Treatment:  16 Compression Therapy Performed for Wound Assessment: Wound #9 Left,Anterior Lower Leg Performed By: Clinician Dellie Catholic, RN Compression Type: Four Layer Post Procedure Diagnosis Same as Pre-procedure Electronic Signature(s) Signed: 06/05/2022 6:24:12 PM By: Dellie Catholic RN Entered By: Dellie Catholic on 06/05/2022 14:47:52 -------------------------------------------------------------------------------- Encounter Discharge Information Details Patient Name: Date of Service: [name], [name]. 06/05/2022 2:15 PM Medical Record Number: [id number] Patient Account Number: [id number] Date of Birth/Sex: Treating RN: 1945-06-05 (77 y.o. Collene Gobble Primary Care Titilayo Hagans: [name] Other Clinician: Referring Towanna Avery: Treating Brevan Luberto/Extender: [name] in Treatment: 16 Encounter Discharge Information Items Discharge Condition: Stable Ambulatory Status: Wheelchair Discharge Destination: Home Transportation: Private [address] [place], [place] [name] ([id number]) [id number] Page 2 of 7 Accompanied By: self Schedule Follow-up Appointment: Yes Clinical Summary of Care: Patient Declined Electronic Signature(s) Signed: 06/05/2022 6:24:12 PM By: Dellie Catholic RN Entered By: Dellie Catholic on 06/05/2022 17:45:38 -------------------------------------------------------------------------------- Lower Extremity Assessment Details Patient Name: Date of Service: [name], [name]. 06/05/2022 2:15 PM Medical Record Number: [id number] Patient Account Number: [id number] Date of Birth/Sex: Treating RN: 11/24/1945 (77 y.o. Collene Gobble Primary Care Renlee Floor: [name] Other Clinician: Referring Noya Santarelli: Treating Aby Gessel/Extender: [name] in Treatment: 16 Edema Assessment Assessed: [name]: No] [name]: No] Edema: [Left: Yes] [Right: Yes] Calf Left: Right: Point of Measurement: From Medial Instep  50.5 cm 57.5 cm Ankle Left: Right: Point of Measurement: From Medial Instep 31 cm 33 cm Vascular Assessment Pulses: Dorsalis Pedis Palpable: [Left:Yes] [Right:Yes] Electronic Signature(s) Signed: 06/05/2022 6:24:12 PM By: Dellie Catholic RN Entered By: Dellie Catholic on 06/05/2022 14:34:55 -------------------------------------------------------------------------------- Multi Wound Chart Details Patient Name: Date of Service: [name], [name]. 06/05/2022 2:15 PM Medical Record Number: [id number] Patient Account Number: [id number] Date of Birth/Sex: Treating RN: [birth year] (77 y.o. M) Primary Care Tajah Schreiner: [name] Other Clinician: Referring Filicia Scogin: Treating Algernon Mundie/Extender: [name] in Treatment: 16 Vital Signs Height(in): 72 Pulse(bpm): 56 Weight(lbs): 375 Blood Pressure(mmHg): 156/75  Body Mass Index(BMI): 50.9 Temperature(F): 98.6 Respiratory Rate(breaths/min): 20 [9:Photos:] [[name]/[name]:[name]/[name]] Left, Anterior Lower Leg [name]/[name] [name]/[name] Wound Location: Gradually Appeared [name]/[name] [name]/[name] Wounding Event: Lymphedema [name]/[name] [name]/[name] Primary Etiology: Anemia, Lymphedema, Sleep Apnea, [name]/[name] [name]/[name] Comorbid History: Arrhythmia, Congestive Heart Failure, Hypertension, Type II Diabetes, Neuropathy 05/13/2022 [name]/[name] [name]/[name] Date Acquired: 2 [name]/[name] [name]/[name] Weeks of Treatment: Open [name]/[name] [name]/[name] Wound Status: No [name]/[name] [name]/[name] Wound Recurrence: Yes [name]/[name] [name]/[name] Clustered Wound: 1.1x0.7x0.1 [name]/[name] [name]/[name] Measurements L x W x D (cm) 0.605 [name]/[name] [name]/[name] [name] (cm) : rea 0.06 [name]/[name] [name]/[name] Volume (cm) : 98.30% [name]/[name] [name]/[name] % Reduction in Area: 98.30% [name]/[name] [name]/[name] % Reduction in Volume: Full Thickness Without Exposed [name]/[name] [name]/[name] Classification: Support Structures Medium [name]/[name] [name]/[name] Exudate [name] mount: Serosanguineous [name]/[name] [name]/[name] Exudate Type: red, brown [name]/[name] [name]/[name] Exudate Color: Large (67-100%) [name]/[name] [name]/[name] Granulation Amount: Red, Hyper-granulation [name]/[name] [name]/[name] Granulation Quality: Small (1-33%) [name]/[name] [name]/[name] Necrotic Amount: Eschar [name]/[name]  [name]/[name] Necrotic Tissue: Fat Layer (Subcutaneous Tissue): Yes [name]/[name] [name]/[name] Exposed Structures: Fascia: No Tendon: No Muscle: No Joint: No Bone: No None [name]/[name] [name]/[name] Epithelialization: Scarring: Yes [name]/[name] [name]/[name] Periwound Skin Texture: No Abnormalities Noted [name]/[name] [name]/[name] Periwound Skin Moisture: Hemosiderin Staining: Yes [name]/[name] [name]/[name] Periwound Skin Color: No Abnormality [name]/[name] [name]/[name] Temperature: Compression Therapy [name]/[name] [name]/[name] Procedures Performed: Treatment Notes Electronic Signature(s) Signed: 06/05/2022 3:12:25 PM By: [name] MD FACS Entered By: [name] on 06/05/2022 15:12:24 -------------------------------------------------------------------------------- Multi-Disciplinary Care Plan Details Patient Name: Date of Service: [name], [name]. 06/05/2022 2:15 PM Medical Record Number: [id number] Patient Account Number: [id number] Date of Birth/Sex: Treating RN: 08/04/1945 (77 y.o. Collene Gobble Primary Care Katya Rolston: [name] Other Clinician: Referring Gid Schoffstall: Treating Shakera Ebrahimi/Extender: [name] in Treatment: [place] reviewed with physician Active Inactive Necrotic Tissue Nursing Diagnoses: Impaired tissue integrity related to necrotic/devitalized tissue Knowledge deficit related to management of necrotic/devitalized tissue Goals: Necrotic/devitalized tissue will be minimized in the wound bed Date Initiated: 02/13/2022 Target Resolution Date: 08/31/2022 Goal Status: Active Patient/caregiver will verbalize understanding of reason and process for debridement of necrotic tissue Date Initiated: 02/13/2022 Date Inactivated: 05/24/2022 Target Resolution Date: 06/02/2022 Goal Status: Met [place], Brinden [name] ([id number] Page 4 of 7 Interventions: Assess patient pain level pre-, during and post procedure and prior to discharge Provide education on necrotic tissue and debridement process Treatment  Activities: Apply topical anesthetic as ordered : 02/13/2022 Notes: Wound/Skin Impairment Nursing Diagnoses: Impaired tissue integrity Knowledge deficit related to ulceration/compromised skin integrity Goals: Patient/caregiver will verbalize understanding of skin care regimen Date Initiated: 02/13/2022 Target Resolution Date: 08/31/2022 Goal Status: Active Interventions: Assess ulceration(s) every visit Treatment Activities: Skin care regimen initiated : 02/13/2022 Topical wound management initiated : 02/13/2022 Notes: Electronic Signature(s) Signed: 06/05/2022 6:24:12 PM By: Dellie Catholic RN Entered By: Dellie Catholic on 06/05/2022 17:44:22 -------------------------------------------------------------------------------- Pain Assessment Details Patient Name: Date of Service: [name], Taiquan [name]. 06/05/2022 2:15 PM Medical Record Number: [id number] Patient Account Number: [id number] Date of Birth/Sex: Treating RN: 17-Mar-1946 (77 y.o. Collene Gobble Primary Care Dayle Mcnerney: [name] Other Clinician: Referring Mia Winthrop: Treating Cheyann Blecha/Extender: [name] in Treatment: 16 Active Problems Location of Pain Severity and Description of Pain Patient Has Paino No Site Locations Pain Management and Medication Current Pain Management: Electronic Signature(s) Signed: 06/05/2022 6:24:12 PM By: Dellie Catholic RN [name], [name] ([id number] Page 5 of 7 Entered By: Dellie Catholic on 06/05/2022 14:32:11 -------------------------------------------------------------------------------- Patient/Caregiver Education Details Patient Name: Date of Service: [name], [name]. 3/4/2024andnbsp2:15 PM Medical Record  Number: [id number] Patient Account Number: [id number] Date of Birth/Gender: Treating RN: 06/22/1945 (77 y.o. Collene Gobble Primary Care Physician: [name] Other Clinician: Referring  Physician: Treating Physician/Extender: [name] in Treatment: 31 Education Assessment Education Provided To: Patient Education Topics Provided Wound/Skin Impairment: Methods: Explain/Verbal Responses: Return demonstration correctly Electronic Signature(s) Signed: 06/05/2022 6:24:12 PM By: Dellie Catholic RN Entered By: Dellie Catholic on 06/05/2022 17:44:36 -------------------------------------------------------------------------------- Wound Assessment Details Patient Name: Date of Service: [name], [name]. 06/05/2022 2:15 PM Medical Record Number: [id number] Patient Account Number: [id number] Date of Birth/Sex: Treating RN: June 17, 1945 (77 y.o. Collene Gobble Primary Care Ailah Barna: [name] Other Clinician: Referring Lexx Monte: Treating Kandis Henry/Extender: [name] in Treatment: 16 Wound Status Wound Number: 9 Primary Lymphedema Etiology: Wound Location: Left, Anterior Lower Leg Wound Open Wounding Event: Gradually Appeared Status: Date Acquired: 05/13/2022 Comorbid Anemia, Lymphedema, Sleep Apnea, Arrhythmia, Congestive Heart Weeks Of Treatment: 2 History: Failure, Hypertension, Type II Diabetes, Neuropathy Clustered Wound: Yes Photos Wound Measurements Length: (cm) 1.1 Width: (cm) 0.7 Depth: (cm) 0.1 Area: (cm) 0.605 Volume: (cm) 0.06 % Reduction in Area: 98.3% % Reduction in Volume: 98.3% Epithelialization: None Tunneling: No Undermining: No Wound Description Classification: Full Thickness Without Exposed Support Structures [name], [name] ([id number]) Exudate Amount: Medium Exudate Type: Serosanguineous Exudate Color: red, brown Foul Odor After Cleansing: No [id number] Page 6 of 7 Slough/Fibrino Yes Wound Bed Granulation Amount: Large (67-100%) Exposed Structure Granulation Quality: Red, Hyper-granulation Fascia Exposed: No Necrotic Amount: Small  (1-33%) Fat Layer (Subcutaneous Tissue) Exposed: Yes Necrotic Quality: Eschar Tendon Exposed: No Muscle Exposed: No Joint Exposed: No Bone Exposed: No Periwound Skin Texture Texture Color No Abnormalities Noted: No No Abnormalities Noted: No Scarring: Yes Hemosiderin Staining: Yes Moisture Temperature / Pain No Abnormalities Noted: Yes Temperature: No Abnormality Treatment Notes Wound #9 (Lower Leg) Wound Laterality: Left, Anterior Cleanser Soap and Water Discharge Instruction: May shower and wash wound with dial antibacterial soap and water prior to dressing change. Wound Cleanser Discharge Instruction: Cleanse the wound with wound cleanser prior to applying [name] clean dressing using gauze sponges, not tissue or cotton balls. Peri-Wound Care Sween Lotion (Moisturizing lotion) Discharge Instruction: Apply moisturizing lotion as directed Topical Primary Dressing Sorbalgon AG Dressing 2x2 (in/in) Discharge Instruction: Apply to wound bed as instructed Secondary Dressing Woven Gauze Sponge, Non-Sterile 4x4 in Discharge Instruction: Apply over primary dressing as directed. Secured With Compression Wrap FourPress (4 layer compression wrap) Discharge Instruction: Apply four layer compression as directed. May also use Urgo K2 compression system as alternative. Compression Stockings Add-Ons Electronic Signature(s) Signed: 06/05/2022 6:24:12 PM By: Dellie Catholic RN Entered By: Dellie Catholic on 06/05/2022 14:37:49 -------------------------------------------------------------------------------- Vitals Details Patient Name: Date of Service: [name], Wilber [name]. 06/05/2022 2:15 PM Medical Record Number: [id number] Patient Account Number: [id number] Date of Birth/Sex: Treating RN: 1945-07-01 (77 y.o. Collene Gobble Primary Care Aundra Espin: [name] Other Clinician: Referring Cyrena Kuchenbecker: Treating Ainslee Sou/Extender: [name] in Treatment: 16 Vital  Signs Time Taken: 14:30 Temperature (F): 98.6 Height (in): 72 Pulse (bpm): [place] ([id number]) [id number] Page 7 of 7 Weight (lbs): 375 Respiratory Rate (breaths/min): 20 Body Mass Index (BMI): 50.9 Blood Pressure (mmHg): 156/75 Reference Range: 80 - 120 mg / dl Airway Pulse Oximetry (%): 95 Inhaled Oxygen Concentration (%): 2 Electronic Signature(s) Signed: 06/05/2022 6:24:12 PM By: Dellie Catholic RN Entered By: Dellie Catholic on 06/05/2022 14:32:02

## 2022-06-11 ENCOUNTER — Other Ambulatory Visit: Payer: Self-pay | Admitting: Internal Medicine

## 2022-06-11 DIAGNOSIS — E118 Type 2 diabetes mellitus with unspecified complications: Secondary | ICD-10-CM

## 2022-06-11 DIAGNOSIS — I5032 Chronic diastolic (congestive) heart failure: Secondary | ICD-10-CM

## 2022-06-13 ENCOUNTER — Encounter (HOSPITAL_BASED_OUTPATIENT_CLINIC_OR_DEPARTMENT_OTHER): Payer: Medicare Other | Admitting: General Surgery

## 2022-06-19 ENCOUNTER — Ambulatory Visit (INDEPENDENT_AMBULATORY_CARE_PROVIDER_SITE_OTHER): Payer: Medicare Other | Admitting: Internal Medicine

## 2022-06-19 ENCOUNTER — Encounter: Payer: Self-pay | Admitting: Internal Medicine

## 2022-06-19 VITALS — BP 132/82 | HR 50 | Temp 98.0°F | Ht 72.0 in | Wt 365.0 lb

## 2022-06-19 DIAGNOSIS — E118 Type 2 diabetes mellitus with unspecified complications: Secondary | ICD-10-CM | POA: Diagnosis not present

## 2022-06-19 DIAGNOSIS — N401 Enlarged prostate with lower urinary tract symptoms: Secondary | ICD-10-CM | POA: Diagnosis not present

## 2022-06-19 DIAGNOSIS — I10 Essential (primary) hypertension: Secondary | ICD-10-CM | POA: Diagnosis not present

## 2022-06-19 DIAGNOSIS — R1032 Left lower quadrant pain: Secondary | ICD-10-CM

## 2022-06-19 DIAGNOSIS — R3911 Hesitancy of micturition: Secondary | ICD-10-CM

## 2022-06-19 DIAGNOSIS — I700 Atherosclerosis of aorta: Secondary | ICD-10-CM

## 2022-06-19 DIAGNOSIS — D538 Other specified nutritional anemias: Secondary | ICD-10-CM

## 2022-06-19 DIAGNOSIS — E538 Deficiency of other specified B group vitamins: Secondary | ICD-10-CM | POA: Diagnosis not present

## 2022-06-19 DIAGNOSIS — K4090 Unilateral inguinal hernia, without obstruction or gangrene, not specified as recurrent: Secondary | ICD-10-CM | POA: Diagnosis not present

## 2022-06-19 LAB — CBC WITH DIFFERENTIAL/PLATELET
Basophils Absolute: 0.1 10*3/uL (ref 0.0–0.1)
Basophils Relative: 0.6 % (ref 0.0–3.0)
Eosinophils Absolute: 0.2 10*3/uL (ref 0.0–0.7)
Eosinophils Relative: 1.7 % (ref 0.0–5.0)
HCT: 39.3 % (ref 39.0–52.0)
Hemoglobin: 12.8 g/dL — ABNORMAL LOW (ref 13.0–17.0)
Lymphocytes Relative: 13.6 % (ref 12.0–46.0)
Lymphs Abs: 1.3 10*3/uL (ref 0.7–4.0)
MCHC: 32.6 g/dL (ref 30.0–36.0)
MCV: 90.1 fl (ref 78.0–100.0)
Monocytes Absolute: 1 10*3/uL (ref 0.1–1.0)
Monocytes Relative: 10.3 % (ref 3.0–12.0)
Neutro Abs: 7 10*3/uL (ref 1.4–7.7)
Neutrophils Relative %: 73.8 % (ref 43.0–77.0)
Platelets: 233 10*3/uL (ref 150.0–400.0)
RBC: 4.36 Mil/uL (ref 4.22–5.81)
RDW: 15.7 % — ABNORMAL HIGH (ref 11.5–15.5)
WBC: 9.5 10*3/uL (ref 4.0–10.5)

## 2022-06-19 LAB — BASIC METABOLIC PANEL
BUN: 34 mg/dL — ABNORMAL HIGH (ref 6–23)
CO2: 38 mEq/L — ABNORMAL HIGH (ref 19–32)
Calcium: 8.7 mg/dL (ref 8.4–10.5)
Chloride: 97 mEq/L (ref 96–112)
Creatinine, Ser: 1.05 mg/dL (ref 0.40–1.50)
GFR: 68.96 mL/min (ref >60.00)
Glucose, Bld: 109 mg/dL — ABNORMAL HIGH (ref 70–99)
Potassium: 4.2 mEq/L (ref 3.5–5.1)
Sodium: 142 mEq/L (ref 135–145)

## 2022-06-19 LAB — MICROALBUMIN / CREATININE URINE RATIO
Creatinine,U: 65.7 mg/dL
Microalb Creat Ratio: 1.1 mg/g (ref 0.0–30.0)
Microalb, Ur: 0.7 mg/dL (ref 0.0–1.9)

## 2022-06-19 LAB — PSA: PSA: 0.31 ng/mL (ref 0.10–4.00)

## 2022-06-19 LAB — HEMOGLOBIN A1C: Hgb A1c MFr Bld: 6.4 % (ref 4.6–6.5)

## 2022-06-19 MED ORDER — CYANOCOBALAMIN 1000 MCG/ML IJ SOLN
1000.0000 ug | Freq: Once | INTRAMUSCULAR | Status: AC
  Administered 2022-06-19: 1000 ug via INTRAMUSCULAR

## 2022-06-19 NOTE — Progress Notes (Unsigned)
Subjective:  Patient ID: [name], male    DOB: May 11, 1945  Age: 77 y.o. MRN: [id number]  CC: Abdominal Pain and Anemia   HPI [name] presents for f/up ---  He complains of a 2 month history of LLQ abd pain.  Outpatient Medications Prior to Visit  Medication Sig Dispense Refill   atorvastatin (LIPITOR) 80 MG tablet TAKE 1 TABLET(80 MG) BY MOUTH DAILY 90 tablet 1   carboxymethylcellulose (REFRESH PLUS) 0.5 % SOLN Place 1 drop into both eyes 3 (three) times daily as needed (dry eyes).     [facility] MELATONIN GUMMIES PO Take 5 mg by mouth at bedtime.     Cyanocobalamin 1000 MCG SUBL Take 1,000 mcg by mouth daily.     Docusate Sodium (DSS) 100 MG CAPS Take 100 mg by mouth every other day.     ELIQUIS 5 MG TABS tablet TAKE 1 TABLET(5 MG) BY MOUTH TWICE DAILY 180 tablet 0   finasteride (PROSCAR) 5 MG tablet Take 1 tablet (5 mg total) by mouth daily. 30 tablet 1   flecainide (TAMBOCOR) 50 MG tablet TAKE 1 TABLET(50 MG) BY MOUTH EVERY 12 HOURS 180 tablet 3   JARDIANCE 10 MG TABS tablet TAKE 1 TABLET(10 MG) BY MOUTH DAILY BEFORE BREAKFAST 90 tablet 1   loratadine (CLARITIN) 10 MG tablet Take 10 mg by mouth daily.     LORazepam (ATIVAN) 0.5 MG tablet Take 1 tablet (0.5 mg total) by mouth at bedtime. 90 tablet 1   metoprolol succinate (TOPROL-XL) 25 MG 24 hr tablet TAKE 1 TABLET(25 MG) BY MOUTH DAILY 90 tablet 3   montelukast (SINGULAIR) 10 MG tablet TAKE 1 TABLET(10 MG) BY MOUTH AT BEDTIME 90 tablet 1   Multiple Vitamin (MULTIVITAMIN WITH MINERALS) TABS tablet Take 1 tablet by mouth daily.     pantoprazole (PROTONIX) 40 MG tablet TAKE 1 TABLET(40 MG) BY MOUTH DAILY 90 tablet 1   potassium chloride SA (KLOR-CON M) 20 MEQ tablet Take 1 tablet (20 mEq total) by mouth 2 (two) times daily. 180 tablet 3   rOPINIRole (REQUIP) 0.5 MG tablet TAKE 1 TABLET(0.5 MG) BY MOUTH THREE TIMES DAILY 270 tablet 2   sertraline (ZOLOFT) 100 MG tablet Take 1.5 tablets (150 mg total) by mouth at bedtime.  135 tablet 1   torsemide (DEMADEX) 20 MG tablet TAKE 3 TABLETS BY MOUTH EVERY DAY 270 tablet 2   traMADol (ULTRAM) 50 MG tablet TAKE 1 TABLET BY MOUTH EVERY 8 HOURS AS NEEDED 90 tablet 5   zinc gluconate 50 MG tablet Take 1 tablet (50 mg total) by mouth daily. 90 tablet 1   No facility-administered medications prior to visit.    ROS Review of Systems  Constitutional:  Positive for fatigue. Negative for chills, diaphoresis and fever.  HENT: Negative.    Eyes: Negative.   Respiratory:  Positive for shortness of breath. Negative for cough, chest tightness and wheezing.   Cardiovascular:  Negative for chest pain, palpitations and leg swelling.  Gastrointestinal:  Positive for abdominal pain. Negative for constipation, diarrhea, nausea and vomiting.  Endocrine: Negative.   Genitourinary: Negative.  Negative for difficulty urinating and dysuria.  Musculoskeletal:  Positive for arthralgias. Negative for myalgias.  Skin: Negative.   Neurological: Negative.  Negative for dizziness, weakness and headaches.  Hematological:  Negative for adenopathy. Does not bruise/bleed easily.  Psychiatric/Behavioral: Negative.      Objective:  BP 132/82 (BP Location: Left Arm, Patient Position: Sitting, Cuff Size: Normal)   Pulse [name])  50   Temp 98 F (36.7 C) (Oral)   Ht 6' (1.829 m)   Wt (!) 365 lb (165.6 kg)   SpO2 95%   BMI 49.50 kg/m   BP Readings from Last 3 Encounters:  06/19/22 132/82  02/15/22 136/78  02/07/22 124/86    Wt Readings from Last 3 Encounters:  06/19/22 (!) 365 lb (165.6 kg)  04/25/22 (!) 370 lb (167.8 kg)  02/15/22 (!) 370 lb (167.8 kg)    Physical Exam Vitals reviewed.  Constitutional:      General: He is not in acute distress.    Appearance: He is obese. He is ill-appearing (obese, on a scooter, continuous O2). He is not toxic-appearing or diaphoretic.  HENT:     Mouth/Throat:     Mouth: Mucous membranes are moist.  Eyes:     General: No scleral icterus.     Conjunctiva/sclera: Conjunctivae normal.  Cardiovascular:     Rate and Rhythm: Regular rhythm. Bradycardia present.     Heart sounds: No murmur heard.    No friction rub. No gallop.  Pulmonary:     Effort: Pulmonary effort is normal.     Breath sounds: No stridor. No wheezing, rhonchi or rales.  Abdominal:     General: Abdomen is protuberant. Bowel sounds are normal. There is no distension or abdominal bruit.     Palpations: There is no hepatomegaly or mass.     Tenderness: There is no abdominal tenderness. There is no guarding or rebound.     Hernia: A hernia is present. Hernia is present in the left inguinal area. There is no hernia in the umbilical area, ventral area, right femoral area, left femoral area or right inguinal area.     Comments: There is a left, direct inguinal hernia that I only palpate with Valsalva maneuver.  Musculoskeletal:        General: Normal range of motion.     Cervical back: Neck supple.     Right lower leg: Edema present.     Left lower leg: Edema present.  Lymphadenopathy:     Cervical: No cervical adenopathy.  Skin:    General: Skin is warm and dry.  Neurological:     Mental Status: He is alert. Mental status is at baseline.  Psychiatric:        Mood and Affect: Mood normal.        Behavior: Behavior normal.     Lab Results  Component Value Date   WBC 9.5 06/19/2022   HGB 12.8 (L) 06/19/2022   HCT 39.3 06/19/2022   PLT 233.0 06/19/2022   GLUCOSE 109 (H) 06/19/2022   CHOL 99 02/07/2022   TRIG 91.0 02/07/2022   HDL 28.30 (L) 02/07/2022   LDLDIRECT 91.0 05/16/2018   LDLCALC 52 02/07/2022   ALT 19 02/07/2022   AST 23 02/07/2022   NA 142 06/19/2022   K 4.2 06/19/2022   CL 97 06/19/2022   CREATININE 1.05 06/19/2022   BUN 34 (H) 06/19/2022   CO2 38 (H) 06/19/2022   TSH 3.03 02/07/2022   PSA 0.31 06/19/2022   INR 1.6 (H) 12/30/2020   HGBA1C 6.4 06/19/2022   MICROALBUR 0.7 06/19/2022    CARDIAC CATHETERIZATION  Result Date:  05/12/2021 Findings: Resting numbers on 2L O2 Ao = 127/60 (85) LV = 114/15 RA =  11 RV = 46/16 PA = 45/18 (32) PCW = 18 Fick cardiac output/index = 9.2/3.3 PVR = 1.6 WU ABG =  7.4/52/83/96% PA sat = 69%, 70%  SVC sat = 69% Unloaded cycling PA = 74/39 (43) PCWP = 40 Thermo CO/CI = 12.5/4.5 Sat 95% 10W cycling PA = 84/37 (52) PCWP = 44 (v waves not prominent) Thermo CO/CI = 11.5/4.1 Sat 81% ABG at peak 7.30/67/55/84% Assessment: 1. Very mild pulmonary HTN at rest with high cardiac output 2. 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 No evidence of L to T intracardiac shunting 4. 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 Recommend: BP control, trial of SGLT2i and weight loss. Glori Bickers, MD 12:01 PM   Assessment & Plan:  B12 deficiency- H&H have improved. -     CBC with Differential/Platelet; Future -     Cyanocobalamin  Anemia due to zinc deficiency- H&H have improved. -     CBC with Differential/Platelet; Future  Type II diabetes mellitus with manifestations ([place])- His blood sugar is well-controlled. -     Microalbumin / creatinine urine ratio; Future -     Hemoglobin A1c; Future -     Basic metabolic panel; Future  Benign essential HTN- His blood pressure is well-controlled. -     Urinalysis, Routine w reflex microscopic; Future -     CBC with Differential/Platelet; Future -     Basic metabolic panel; Future  Benign prostatic hyperplasia with urinary hesitancy -     PSA; Future  Atherosclerosis of aorta ([place])- Risk factor modifications addressed.  Left lower quadrant abdominal pain -     Ambulatory referral to General Surgery  Inguinal hernia of left side without obstruction or gangrene -     Ambulatory referral to General Surgery      Follow-up: Return in about 3 months (around 09/19/2022).  [name], MD

## 2022-06-19 NOTE — Patient Instructions (Signed)

## 2022-06-20 ENCOUNTER — Ambulatory Visit (HOSPITAL_BASED_OUTPATIENT_CLINIC_OR_DEPARTMENT_OTHER): Payer: Medicare Other | Admitting: Internal Medicine

## 2022-06-20 ENCOUNTER — Encounter: Payer: Self-pay | Admitting: Internal Medicine

## 2022-06-20 DIAGNOSIS — K4090 Unilateral inguinal hernia, without obstruction or gangrene, not specified as recurrent: Secondary | ICD-10-CM | POA: Insufficient documentation

## 2022-06-20 LAB — URINALYSIS, ROUTINE W REFLEX MICROSCOPIC
Bilirubin Urine: NEGATIVE
Hgb urine dipstick: NEGATIVE
Ketones, ur: NEGATIVE
Leukocytes,Ua: NEGATIVE
Nitrite: NEGATIVE
Specific Gravity, Urine: 1.01 (ref 1.000–1.030)
Total Protein, Urine: NEGATIVE
Urine Glucose: 500 — AB
Urobilinogen, UA: 1 (ref 0.0–1.0)
pH: 6 (ref 5.0–8.0)

## 2022-06-22 ENCOUNTER — Encounter (HOSPITAL_BASED_OUTPATIENT_CLINIC_OR_DEPARTMENT_OTHER): Payer: Medicare Other | Admitting: Internal Medicine

## 2022-06-22 DIAGNOSIS — E11622 Type 2 diabetes mellitus with other skin ulcer: Secondary | ICD-10-CM | POA: Diagnosis not present

## 2022-06-22 DIAGNOSIS — Z6841 Body Mass Index (BMI) 40.0 and over, adult: Secondary | ICD-10-CM | POA: Diagnosis not present

## 2022-06-22 DIAGNOSIS — Z872 Personal history of diseases of the skin and subcutaneous tissue: Secondary | ICD-10-CM | POA: Diagnosis not present

## 2022-06-22 DIAGNOSIS — Z09 Encounter for follow-up examination after completed treatment for conditions other than malignant neoplasm: Secondary | ICD-10-CM | POA: Diagnosis not present

## 2022-06-22 DIAGNOSIS — I110 Hypertensive heart disease with heart failure: Secondary | ICD-10-CM | POA: Diagnosis not present

## 2022-06-22 DIAGNOSIS — L97828 Non-pressure chronic ulcer of other part of left lower leg with other specified severity: Secondary | ICD-10-CM | POA: Diagnosis not present

## 2022-06-22 DIAGNOSIS — I890 Lymphedema, not elsewhere classified: Secondary | ICD-10-CM | POA: Diagnosis not present

## 2022-06-22 DIAGNOSIS — J9611 Chronic respiratory failure with hypoxia: Secondary | ICD-10-CM | POA: Diagnosis not present

## 2022-06-22 DIAGNOSIS — I5030 Unspecified diastolic (congestive) heart failure: Secondary | ICD-10-CM | POA: Diagnosis not present

## 2022-06-24 NOTE — Progress Notes (Signed)
[name] ([id number] Page 1 of 8 Visit Report for 06/22/2022 Chief Complaint Document Details Patient Name: Date of Service: [name]. 06/22/2022 2:45 PM Medical Record Number: [id number] Patient Account Number: [id number] Date of Birth/Sex: Treating RN: [birth year] (77 y.o. M) Primary Care Provider: Scarlette [name] Other Clinician: Referring Provider: Treating Provider/Extender: [name] in Treatment: 18 Information Obtained from: Patient Chief Complaint Patients presents for treatment of open ulcers secondary to lymphedema and diabetes Electronic Signature(s) Signed: 06/23/2022 7:52:32 AM By: [name] Maudlin MD FACS Entered By: [name] on 06/23/2022 07:52:32 -------------------------------------------------------------------------------- HPI Details Patient Name: Date of Service: [name], [name]. 06/22/2022 2:45 PM Medical Record Number: [id number] Patient Account Number: [id number] Date of Birth/Sex: Treating RN: [birth year] (77 y.o. M) Primary Care Provider: Scarlette [name] Other Clinician: Referring Provider: Treating Provider/Extender: [name] in Treatment: 18 History of Present Illness HPI Description: ADMISSION 02/13/2022 This is [name] 77 year old morbidly obese type II diabetic (last hemoglobin A1c 6.6%) who presented to his primary care provider's office on November 7 with an ulcer on the top of his foot. He said it had been there for about [name] week. He is insensate in his feet and his wife had been caring for it. His PCP took [name] swab from the wound which grew back strep. [name] was prescribed and he was referred to the wound care center for further evaluation and management. In addition to diabetes, he also has pulmonary hypertension and diastolic congestive heart failure. 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 He has been seen at the voice and swallowing disorders clinic in [name] and no further intervention has been felt necessary. On exam, he has severe, at least stage II, lymphedema. In addition to the dorsal foot wound, he has ulcers on both lower legs. He has [name] left anterior lower leg, [name] right lateral lower leg, and [name] right anterior lower leg wound. ABIs in clinic were noncompressible. 02/22/2022: The right lateral leg wound has closed. The other wounds are about the same size to slightly smaller. All of them have [name] thick layer of rubbery slough on the surface. Edema control is still suboptimal. 03/02/2022: The left anterior leg wound has deteriorated. His leg is more swollen and red. There is thick slough accumulation on the surface. The right dorsal foot wound is smaller, but also has substantial slough buildup. 03/09/2022: The PCR culture that I took last week was positive for Pseudomonas. Levofloxacin was prescribed and he has [name] few days left of this. His leg looks better and the wound is cleaner. The right dorsal foot wound has [name] very thick layer of slough on the surface. Both wounds are smaller. 03/30/2022: All of the wounds are smaller today and very superficial. The dorsal foot wound on the right is nearly closed. There is just some slough and eschar present. 04/10/2022: The right dorsal foot wound has healed. Unfortunately, his right-sided wrap slid and above this, he developed significant swelling and now has blisters and several [name] open wounds with slough accumulation. The left anterior leg wound is small with slough and eschar buildup. 04/25/2022: The wounds on his right leg have healed. The wound on his left leg is down to [name] pinhole underneath some eschar. He attempted to cut his own nails and nicked his great toe, creating [name] [name] wound. It is fairly superficial. 05/02/2022: His left leg wound and left great  toe wound have healed. Unfortunately, he was out of his compression and  developed [name] [name] wound on his right leg. It is superficial and actually looks more like [name] scratch than anything else. Edema control on the left is quite good, while on the right it is rather abysmal. 05/17/2022: He missed his appointment last week due to transportation issues. He has [name] [name] wound on his left anterior lower leg. Despite taking his diuretics and wearing his compression wraps, his edema control is inadequate. 05/24/2022: The wounds on his right leg are healed. The wounds on his left leg are small and superficial with just [name] little eschar around the edges. He has not heard anything regarding his lymphedema pumps. 06/05/2022: The wound on his left leg is very small and superficial. It is very clean. 06/23/2022: All of his wounds are healed. [name] ([id number] Page 2 of 8 Electronic Signature(s) Signed: 06/23/2022 7:52:46 AM By: [name] Maudlin MD FACS Entered By: [name] on 06/23/2022 07:52:46 -------------------------------------------------------------------------------- Physical Exam Details Patient Name: Date of Service: [name], [name]. 06/22/2022 2:45 PM Medical Record Number: [id number] Patient Account Number: [id number] Date of Birth/Sex: Treating RN: 09-06-1945 (77 y.o. M) Primary Care Provider: Scarlette [name] Other Clinician: Referring Provider: Treating Provider/Extender: [name] in Treatment: 18 Constitutional Hypertensive, asymptomatic. Bradycardic, asymptomatic. . . no acute distress. Respiratory Normal work of breathing on room air. Notes 06/22/2022: All of his wounds are healed. Electronic Signature(s) Signed: 06/23/2022 7:53:20 AM By: [name] Maudlin MD FACS Entered By: [name] on 06/23/2022 07:53:19 -------------------------------------------------------------------------------- Physician Orders Details Patient Name: Date of Service: [name], [name].  06/22/2022 2:45 PM Medical Record Number: [id number] Patient Account Number: [id number] Date of Birth/Sex: Treating RN: 1946-03-03 (77 y.o. Valene Bors Primary Care Provider: Scarlette [name] Other Clinician: Referring Provider: Treating Provider/Extender: [name] in Treatment: [phone] Verbal / Phone Orders: No Diagnosis Coding ICD-10 Coding Code Description [phone] 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ity I50.30 Unspecified diastolic (congestive) heart failure J96.11 Chronic respiratory failure with hypoxia E66.01 Morbid (severe) obesity due to excess calories I89.0 Lymphedema, not elsewhere classified Discharge From [facility] Discharge from [facility] (In clinic) Topical Lidocaine 4% applied to wound bed Bathing/ Shower/ Hygiene May shower and wash wound with soap and water. Edema Control - Lymphedema / SCD / Other Lymphedema Pumps. Use Lymphedema pumps on leg(s) 2-3 times [name] day for 45-60 minutes. If wearing any wraps or hose, do not remove them. Continue exercising as instructed. - once available Elevate legs to the level of the heart or above for 30 minutes daily and/or when sitting for 3-4 times [name] day throughout the day. - use [name] wedge pillow for legs while in bed Avoid standing for long periods of time. Exercise regularly Moisturize legs daily. Compression stocking or Garment 30-40 mm/Hg pressure to: - Farro Wraps to both legs daily. Electronic Signature(s) LOLLEY, Winford [name] ([id number]) [id number] Page 3 of 8 Signed: 06/23/2022 9:45:17 AM By: [name] Maudlin MD FACS Previous Signature: 06/22/2022 4:25:44 PM Version By: [name] Maudlin MD FACS Entered By: [name] on 06/23/2022 07:53:33 -------------------------------------------------------------------------------- Problem List Details Patient Name: Date of Service: [name], [name].  06/22/2022 2:45 PM Medical Record Number: [id number] Patient Account Number: [id number] Date of Birth/Sex: Treating RN: 1945-08-18 (77 y.o. Valene Bors Primary Care Provider: Scarlette [name] Other Clinician: Referring Provider: Treating Provider/Extender: [name] in Treatment:  18 Active Problems ICD-10 Encounter Code Description Active Date MDM Diagnosis L97.829 Non-pressure chronic ulcer of other part of left lower leg with unspecified 05/17/2022 No Yes severity I50.30 Unspecified diastolic (congestive) heart failure 02/13/2022 No Yes J96.11 Chronic respiratory failure with hypoxia 02/13/2022 No Yes E66.01 Morbid (severe) obesity due to excess calories 02/13/2022 No Yes I89.0 Lymphedema, not elsewhere classified 02/13/2022 No Yes Inactive Problems ICD-10 Code Description Active Date Inactive Date L97.512 Non-pressure chronic ulcer of other part of right foot with fat layer exposed 02/13/2022 02/13/2022 Resolved Problems ICD-10 Code Description Active Date Resolved Date L97.812 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d 02/13/2022 02/13/2022 L97.811 Non-pressure chronic ulcer of other part of right lower leg limited to breakdown of skin 05/02/2022 05/02/2022 L97.822 Non-pressure chronic ulcer of other part of left lower leg with fat layer exposed 02/13/2022 02/13/2022 L97.511 Non-pressure chronic ulcer of other part of right foot limited to breakdown of skin 04/25/2022 04/25/2022 L03.115 Cellulitis of right lower limb 02/13/2022 02/13/2022 E11.621 Type 2 diabetes mellitus with foot ulcer 02/13/2022 02/13/2022 Rountree, Edouard [name] ([id number]) [id number] Page 4 of 8 Electronic Signature(s) Signed: 06/23/2022 7:52:20 AM By: [name] Maudlin MD FACS Previous Signature: 06/22/2022 4:25:44 PM Version By: [name] Maudlin MD FACS Entered By: [name] on 06/23/2022  07:52:20 -------------------------------------------------------------------------------- Progress Note Details Patient Name: Date of Service: [name] [facility] N, Blayton [name]. 06/22/2022 2:45 PM Medical Record Number: [id number] Patient Account Number: [id number] Date of Birth/Sex: Treating RN: September 17, 1945 (77 y.o. M) Primary Care Provider: Scarlette [name] Other Clinician: Referring Provider: Treating Provider/Extender: [name] in Treatment: 18 Subjective Chief Complaint Information obtained from Patient Patients presents for treatment of open ulcers secondary to lymphedema and diabetes History of Present Illness (HPI) ADMISSION 02/13/2022 This is [name] 77 year old morbidly obese type II diabetic (last hemoglobin A1c 6.6%) who presented to his primary care provider's office on November 7 with an ulcer on the top of his foot. He said it had been there for about [name] week. He is insensate in his feet and his wife had been caring for it. His PCP took [name] swab from the wound which grew back strep. [name] was prescribed and he was referred to the wound care center for further evaluation and management. In addition to diabetes, he also has pulmonary hypertension and diastolic congestive heart failure. 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 He has been seen at the voice and swallowing disorders clinic in [place] and no further intervention has been felt necessary. On exam, he has severe, at least stage II, lymphedema. In addition to the dorsal foot wound, he has ulcers on both lower legs. He has [name] left anterior lower leg, [name] right lateral lower leg, and [name] right anterior lower leg wound. ABIs in clinic were noncompressible. 02/22/2022: The right lateral leg wound has closed. The other wounds are about the same size to slightly smaller. All of them have [name] thick layer of rubbery slough on the surface. Edema control is still  suboptimal. 03/02/2022: The left anterior leg wound has deteriorated. His leg is more swollen and red. There is thick slough accumulation on the surface. The right dorsal foot wound is smaller, but also has substantial slough buildup. 03/09/2022: The PCR culture that I took last week was positive for Pseudomonas. Levofloxacin was prescribed and he has [name] few days left of this. His leg looks better and the wound is cleaner. The  right dorsal foot wound has [name] very thick layer of slough on the surface. Both wounds are smaller. 03/30/2022: All of the wounds are smaller today and very superficial. The dorsal foot wound on the right is nearly closed. There is just some slough and eschar present. 04/10/2022: The right dorsal foot wound has healed. Unfortunately, his right-sided wrap slid and above this, he developed significant swelling and now has blisters and several [name] open wounds with slough accumulation. The left anterior leg wound is small with slough and eschar buildup. 04/25/2022: The wounds on his right leg have healed. The wound on his left leg is down to [name] pinhole underneath some eschar. He attempted to cut his own nails and nicked his great toe, creating [name] [name] wound. It is fairly superficial. 05/02/2022: His left leg wound and left great toe wound have healed. Unfortunately, he was out of his compression and developed [name] [name] wound on his right leg. It is superficial and actually looks more like [name] scratch than anything else. Edema control on the left is quite good, while on the right it is rather abysmal. 05/17/2022: He missed his appointment last week due to transportation issues. He has [name] [name] wound on his left anterior lower leg. Despite taking his diuretics and wearing his compression wraps, his edema control is inadequate. 05/24/2022: The wounds on his right leg are healed. The wounds on his left leg are small and superficial with just [name] little eschar around the edges. He has not heard anything  regarding his lymphedema pumps. 06/05/2022: The wound on his left leg is very small and superficial. It is very clean. 06/23/2022: All of his wounds are healed. Patient History Information obtained from Patient. Family History Cancer - Paternal Grandparents, Heart Disease - Maternal Grandparents,Father, No family history of Diabetes, Hereditary Spherocytosis, Hypertension, Kidney Disease, Lung Disease, Seizures, Stroke, Thyroid Problems, Tuberculosis. Social History Former smoker - smoked as [name] teen, Marital Status - Married, Alcohol Use - Never, Drug Use - No History, Caffeine Use - Never. Medical History Hematologic/Lymphatic Patient has history of Anemia, Lymphedema Respiratory Patient has history of Sleep Apnea Cardiovascular Patient has history of Arrhythmia - PAF, Congestive Heart Failure - diastolic, Hypertension Endocrine Patient has history of Type II Diabetes - takes no meds, "borderline A1c" [name], [name] ([id number] Page 5 of 8 Neurologic Patient has history of Neuropathy Oncologic Denies history of Received Chemotherapy, Received Radiation Hospitalization/Surgery History - right and left heart cath. - colonoscopy. - esophagogastroduodenoscopy. - subxyphoid pericardial window. - intraoperative transesophageal echocardiogram. - laparoscopic gastric banding with hiatal hernia repair. - breath tek h pylori. - cholecysectomy. - arthroscopy knee w/ drilling. - lithotripsy. - tonsillectomy. - total knee replacement. - trigger finger repair. - wisdom tooth extraction. Medical [name] Surgical History Notes nd Constitutional Symptoms (General Health) obsesity Hematologic/Lymphatic chronic anticoagulation Cardiovascular pericarditis Gastrointestinal pt says 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 but they could not prove it Genitourinary BPH Neurologic 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, embolic cerebral infarction,  CVA Psychiatric depression Objective Constitutional Hypertensive, asymptomatic. Bradycardic, asymptomatic. no acute distress. Vitals Time Taken: 3:00 PM, Height: 72 in, Weight: 375 lbs, BMI: 50.9, Temperature: 98.5 F, Pulse: 52 bpm, Respiratory Rate: 18 breaths/min, Blood Pressure: 157/75 mmHg. Respiratory Normal work of breathing on room air. General Notes: 06/22/2022: All of his wounds are healed. Integumentary (Hair, Skin) Wound #9 status is Healed - Epithelialized. Original cause of wound was Gradually Appeared. The date acquired was: 05/13/2022. The wound has been in treatment 5  weeks. The wound is located on the Left,Anterior Lower Leg. The wound measures 0cm length x 0cm width x 0cm depth; 0cm^2 area and 0cm^3 volume. There is no tunneling or undermining noted. There is [name] none present amount of drainage noted. There is no granulation within the wound bed. There is no necrotic tissue within the wound bed. The periwound skin appearance had no abnormalities noted for texture. The periwound skin appearance had no abnormalities noted for moisture. The periwound skin appearance exhibited: Hemosiderin Staining. Periwound temperature was noted as No Abnormality. Assessment Active Problems ICD-10 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ity Unspecified diastolic (congestive) heart failure Chronic respiratory failure with hypoxia Morbid (severe) obesity due to excess calories Lymphedema, not elsewhere classified Plan Discharge From [facility] Services: Discharge from [facility]: (In clinic) Topical Lidocaine 4% applied to wound bed Bathing/ Shower/ Hygiene: May shower and wash wound with soap and water. Edema Control - Lymphedema / SCD / Other: Lymphedema Pumps. Use Lymphedema pumps on leg(s) 2-3 times [name] day for 45-60 minutes. If wearing any wraps or hose, do not remove them. Continue exercising as instructed. - once available Elevate  legs to the level of the heart or above for 30 minutes daily and/or when sitting for 3-4 times [name] day throughout the day. - use [name] wedge pillow for legs [name], [name] ([id number] Page 6 of 8 while in bed Avoid standing for long periods of time. Exercise regularly Moisturize legs daily. Compression stocking or Garment 30-40 mm/Hg pressure to: - Farro Wraps to both legs daily. 06/22/2022: All of his wounds are healed. The wrap for the lymphedema pumps came out and they are working on getting him the correct size. He does have [place] wraps and he has been taught how to apply them. Because of his lymphedema, he does qualify for multiple pairs of these so we will work on helping him get additional pairs. He was reminded of the importance of using his lymphedema pumps on [name] least [name] daily if not twice daily basis and wearing his compression wraps religiously. Electronic Signature(s) Signed: 06/23/2022 7:56:11 AM By: [name] Maudlin MD FACS Entered By: [name] on 06/23/2022 07:56:11 -------------------------------------------------------------------------------- HxROS Details Patient Name: Date of Service: [name], [name]. 06/22/2022 2:45 PM Medical Record Number: [id number] Patient Account Number: [id number] Date of Birth/Sex: Treating RN: 1945/08/19 (77 y.o. M) Primary Care Provider: Scarlette [name] Other Clinician: Referring Provider: Treating Provider/Extender: [name] in Treatment: 18 Information Obtained From Patient Constitutional Symptoms (General Health) Medical History: Past Medical History Notes: obsesity Hematologic/Lymphatic Medical History: Positive for: Anemia; Lymphedema Past Medical History Notes: chronic anticoagulation Respiratory Medical History: Positive for: Sleep Apnea Cardiovascular Medical History: Positive for: Arrhythmia - PAF; Congestive Heart Failure - diastolic;  Hypertension Past Medical History Notes: pericarditis Gastrointestinal Medical History: Past Medical History Notes: pt says 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 but they could not prove it Endocrine Medical History: Positive for: Type II Diabetes - takes no meds, "borderline A1c" Time with diabetes: 2-3 years ago Blood sugar tested every day: No Genitourinary Medical History: Past Medical History Notes: BPH [name], [name] ([id number] Page 7 of 8 Neurologic Medical History: Positive for: Neuropathy Past Medical History Notes: 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, embolic cerebral infarction, CVA Oncologic Medical History: Negative for: Received Chemotherapy; Received Radiation Psychiatric Medical History: Past Medical History Notes: depression Immunizations Pneumococcal Vaccine: Received Pneumococcal Vaccination: Yes Received Pneumococcal Vaccination On  or After 60th Birthday: Yes Implantable Devices None Hospitalization / Surgery History Type of Hospitalization/Surgery right and left heart cath colonoscopy esophagogastroduodenoscopy subxyphoid pericardial window intraoperative transesophageal echocardiogram laparoscopic gastric banding with hiatal hernia repair breath tek h pylori cholecysectomy arthroscopy knee w/ drilling lithotripsy tonsillectomy total knee replacement trigger finger repair wisdom tooth extraction Family and Social History Cancer: Yes - Paternal Grandparents; Diabetes: No; Heart Disease: Yes - Maternal Grandparents,Father; Hereditary Spherocytosis: No; Hypertension: No; Kidney Disease: No; Lung Disease: No; Seizures: No; Stroke: No; Thyroid Problems: No; Tuberculosis: No; Former smoker - smoked as [name] teen; Marital Status - Married; Alcohol Use: Never; Drug Use: No History; Caffeine Use: Never; Financial Concerns: No; Food, Clothing or Shelter Needs: No; Support System Lacking: No; Transportation  Concerns: No Electronic Signature(s) Signed: 06/23/2022 9:45:17 AM By: [name] Maudlin MD FACS Entered By: [name] on 06/23/2022 07:52:53 -------------------------------------------------------------------------------- SuperBill Details Patient Name: Date of Service: [name], Keanthony [name]. 06/22/2022 Medical Record Number: [id number] Patient Account Number: [id number] Date of Birth/Sex: Treating RN: [birth year] (77 y.o. Valene Bors Primary Care Provider: Scarlette [name] Other Clinician: Referring Provider: Treating Provider/Extender: [name] in Treatment: 18 Diagnosis Coding ICD-10 Codes Code Description [phone] 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ity I50.30 Unspecified diastolic (congestive) heart failure J96.11 Chronic respiratory failure with hypoxia E66.01 Morbid (severe) obesity due to excess calories [name], [name] ([id number] Page 8 of 8 I89.0 Lymphedema, not elsewhere classified Facility Procedures : CPT4 Code: [id number] Description: [id number] - WOUND CARE VISIT-LEV 3 EST PT Modifier: Quantity: 1 Physician Procedures : CPT4 Code Description Modifier [id number] - WC PHYS LEVEL 3 - EST PT ICD-10 Diagnosis Description L97.829 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ity I89.0 Lymphedema, not elsewhere classified I50.30  Unspecified diastolic (congestive) heart failure Quantity: 1 Electronic Signature(s) Signed: 06/23/2022 8:01:57 AM By: [name] Maudlin MD FACS Previous Signature: 06/22/2022 4:25:44 PM Version By: [name] Maudlin MD FACS Entered By: [name] on 06/23/2022 08:01:57

## 2022-06-28 ENCOUNTER — Telehealth: Payer: Self-pay | Admitting: *Deleted

## 2022-06-28 NOTE — Telephone Encounter (Signed)
Pharmacist  Gayla Medicus from [place] calling to inform patient is having side effect of drowsiness from what she believes in an drug interaction between Lorazepam and Tramadol 50 mg. Patient has been on Lorazepam for many years and they are afraid patient may get hurt. Call back [phone].

## 2022-06-29 ENCOUNTER — Telehealth: Payer: Self-pay

## 2022-06-29 NOTE — Telephone Encounter (Signed)
Message from pharmacy stating patient taking Tramadol 50 mg and Lorazepam, he is experiencing side effects. Spoke to patient and he states that he has a little drowsiness but he feels that it comes from inactivity. He states he does not want to come off neither of the medications but willing to discuss this situation.

## 2022-07-03 NOTE — Telephone Encounter (Signed)
Attempted to call patient no answer/voicemail.

## 2022-07-05 NOTE — Telephone Encounter (Signed)
Message from pharmacy stating patient taking Tramadol 50 mg and Lorazepam, he is experiencing side effects. Spoke to patient and he states that he has a little drowsiness but he feels that it comes from inactivity. He states he does not want to come off neither of the medications but willing to discuss this situation. 

## 2022-07-05 NOTE — Telephone Encounter (Signed)
2nd call placed to patient no answer or machine.

## 2022-07-11 NOTE — Telephone Encounter (Signed)
Pt.notified

## 2022-07-19 ENCOUNTER — Telehealth: Payer: Self-pay | Admitting: Internal Medicine

## 2022-07-19 DIAGNOSIS — Z79899 Other long term (current) drug therapy: Secondary | ICD-10-CM

## 2022-07-19 NOTE — Telephone Encounter (Signed)
Pharmacy is requesting call back to get clarification as to why the patient is not taking an Ace Inhibitor medication with his diagnosis. Caller states that insurance has flagged this as a possible issue. Please advise.

## 2022-07-19 NOTE — Telephone Encounter (Signed)
Routed to MD to review  Not on ACE-I or ARB  From last NP note: Chronic HFpEF: Normal LVEF 60-65%, normal diastolic parameters, mild LVH on echo 04/12/2021. Chronic DOE and chronic bilateral LE edema.  He feels that these are stable. Volume status is difficult to assess due to body habitus.  He is on GDMT including Jardiance, metoprolol, torsemide.

## 2022-07-20 NOTE — Progress Notes (Signed)
DIALS, Lakshya A ([id number]) [id number] Page 1 of 7 Visit Report for 06/22/2022 Arrival Information Details Patient Name: Date of Service: [name], [name] 06/22/2022 2:45 PM Medical Record Number: [id number] Patient Account Number: [id number] Date of Birth/Sex: Treating RN: [birth year] (77 y.o. [name] Primary Care Belita Warsame: Sanda Linger Other Clinician: Referring Anuradha Chabot: Treating Maudell Stanbrough/Extender: Royston Sinner in Treatment: 18 Visit Information History Since Last Visit All ordered tests and consults were completed: Yes Patient Arrived: Wheel Chair Added or deleted any medications: No Arrival Time: 14:58 Any new allergies or adverse reactions: No Accompanied By: self Had a fall or experienced change in No Transfer Assistance: None activities of daily living that may affect Patient Identification Verified: Yes risk of falls: Secondary Verification Process Completed: Yes Signs or symptoms of abuse/neglect since last visito No Patient Requires Transmission-Based Precautions: No Hospitalized since last visit: No Patient Has Alerts: Yes Implantable device outside of the clinic excluding No Patient Alerts: Patient on Blood Thinner cellular tissue based products placed in the center since last visit: Pain Present Now: No Electronic Signature(s) Signed: 07/20/2022 1:55:51 PM By: Brenton Grills Entered By: Brenton Grills on 06/22/2022 14:59:49 -------------------------------------------------------------------------------- Clinic Level of Care Assessment Details Patient Name: Date of Service: CAMDEN, KNOTEK A. 06/22/2022 2:45 PM Medical Record Number: [id number] Patient Account Number: [id number] Date of Birth/Sex: Treating RN: [birth year] (77 y.o. [name] Primary Care Toshie Demelo: Sanda Linger Other Clinician: Referring Velma Hanna: Treating Skyanne Welle/Extender: Royston Sinner in  Treatment: 18 Clinic Level of Care Assessment Items TOOL 4 Quantity Score X- 1 0 Use when only an EandM is performed on FOLLOW-UP visit ASSESSMENTS - Nursing Assessment / Reassessment X- 1 10 Reassessment of [name]-morbidities (includes updates in patient status) X- 1 5 Reassessment of Adherence to Treatment Plan ASSESSMENTS - Wound and Skin A ssessment / Reassessment X - Simple Wound Assessment / Reassessment - one wound 1 5 []  - 0 Complex Wound Assessment / Reassessment - multiple wounds []  - 0 Dermatologic / Skin Assessment (not related to wound area) ASSESSMENTS - Focused Assessment X- 1 5 Circumferential Edema Measurements - multi extremities []  - 0 Nutritional Assessment / Counseling / Intervention X- 1 5 Lower Extremity Assessment (monofilament, tuning fork, pulses) []  - 0 Peripheral Arterial Disease Assessment (using hand held doppler) ASSESSMENTS - Ostomy and/or Continence Assessment and Care []  - 0 Incontinence Assessment and Management []  - 0 Ostomy Care Assessment and Management (repouching, etc.) PROCESS - Coordination of Care X - Simple Patient / Family Education for ongoing care 1 15 Bokhari, Brycen A ([id number]) [id number] Page 2 of 7 []  - 0 Complex (extensive) Patient / Family Education for ongoing care X- 1 10 Staff obtains Chiropractor, Records, T Results / Process Orders est []  - 0 Staff telephones HHA, Nursing Homes / Clarify orders / etc []  - 0 Routine Transfer to another Facility (non-emergent condition) []  - 0 Routine Hospital Admission (non-emergent condition) []  - 0 New Admissions / Manufacturing engineer / Ordering NPWT Apligraf, etc. , []  - 0 Emergency Hospital Admission (emergent condition) X- 1 10 Simple Discharge Coordination []  - 0 Complex (extensive) Discharge Coordination PROCESS - Special Needs []  - 0 Pediatric / Minor Patient Management []  - 0 Isolation Patient Management []  - 0 Hearing / Language /  Visual special needs []  - 0 Assessment of Community assistance (transportation, D/C planning, etc.) []  - 0 Additional assistance / Altered mentation []  - 0 Support Surface(s) Assessment (bed, cushion, seat, etc.) INTERVENTIONS - Wound  Cleansing / Measurement X - Simple Wound Cleansing - one wound 1 5  - 0 Complex Wound Cleansing - multiple wounds X- 1 5 Wound Imaging (photographs - any number of wounds)  - 0 Wound Tracing (instead of photographs)  - 0 Simple Wound Measurement - one wound  - 0 Complex Wound Measurement - multiple wounds INTERVENTIONS - Wound Dressings  - 0 Small Wound Dressing one or multiple wounds  - 0 Medium Wound Dressing one or multiple wounds  - 0 Large Wound Dressing one or multiple wounds  - 0 Application of Medications - topical  - 0 Application of Medications - injection INTERVENTIONS - Miscellaneous  - 0 External ear exam  - 0 Specimen Collection (cultures, biopsies, blood, body fluids, etc.)  - 0 Specimen(s) / Culture(s) sent or taken to Lab for analysis  - 0 Patient Transfer (multiple staff / Nurse, adult / Similar devices)  - 0 Simple Staple / Suture removal (25 or less)  - 0 Complex Staple / Suture removal (26 or more)  - 0 Hypo / Hyperglycemic Management (close monitor of Blood Glucose)  - 0 Ankle / Brachial Index (ABI) - do not check if billed separately X- 1 5 Vital Signs Has the patient been seen at the hospital within the last three years: Yes Total Score: 80 Level Of Care: New/Established - Level 3 Electronic Signature(s) Signed: 07/20/2022 1:55:51 PM By: Brenton Grills Entered By: Brenton Grills on 06/22/2022 15:38:56 [name], [name] A ([id number]) [id number] Page 3 of 7 -------------------------------------------------------------------------------- Encounter Discharge Information Details Patient Name: Date of Service: [name], [name] 06/22/2022 2:45  PM Medical Record Number: [id number] Patient Account Number: [id number] Date of Birth/Sex: Treating RN: [birth year] (77 y.o. [name] Primary Care Nikolis Berent: Sanda Linger Other Clinician: Referring Danel Requena: Treating Najwa Spillane/Extender: Royston Sinner in Treatment: 18 Encounter Discharge Information Items Discharge Condition: Stable Ambulatory Status: Wheelchair Discharge Destination: Home Transportation: Private Auto Accompanied By: self Schedule Follow-up Appointment: No Clinical Summary of Care: Patient Declined Electronic Signature(s) Signed: 06/22/2022 3:58:34 PM By: Brenton Grills Entered By: Brenton Grills on 06/22/2022 15:58:34 -------------------------------------------------------------------------------- Lower Extremity Assessment Details Patient Name: Date of Service: [name], [name] 06/22/2022 2:45 PM Medical Record Number: [id number] Patient Account Number: [id number] Date of Birth/Sex: Treating RN: 1946/03/10 (77 y.o. [name] Primary Care Sheretta Grumbine: Sanda Linger Other Clinician: Referring Itali Mckendry: Treating Alesandra Smart/Extender: Royston Sinner in Treatment: 18 Edema Assessment Assessed: Kyra Searles: No] Franne Forts: No] Edema: [Left: Ye] [Right: s] Calf Left: Right: Point of Measurement: From Medial Instep 53 cm Ankle Left: Right: Point of Measurement: From Medial Instep 31.7 cm Vascular Assessment Pulses: Dorsalis Pedis Palpable: [Left:Yes] Electronic Signature(s) Signed: 07/20/2022 1:55:51 PM By: Brenton Grills Entered By: Brenton Grills on 06/22/2022 15:16:23 -------------------------------------------------------------------------------- Multi Wound Chart Details Patient Name: Date of Service: [name], [name] 06/22/2022 2:45 PM Medical Record Number: [id number] Patient Account Number: [id number] Date of Birth/Sex: Treating RN: 1945-11-04 (77 y.o. M) Primary Care Miko Sirico: Sanda Linger Other  Clinician: Referring Khylan Sawyer: Treating Nirvaan Frett/Extender: Royston Sinner in Treatment: [address], [place] A ([id number]) [id number] Page 4 of 7 Vital Signs Height(in): 72 Pulse(bpm): 52 Weight(lbs): 375 Blood Pressure(mmHg): 157/75 Body Mass Index(BMI): 50.9 Temperature(F): 98.5 Respiratory Rate(breaths/min): 18 [9:Photos:] [[name]/A:[name]/A] Left, Anterior Lower Leg [name]/A [name]/A Wound Location: Gradually Appeared [name]/A [name]/A Wounding Event: Lymphedema [name]/A [name]/A Primary Etiology: Anemia, Lymphedema, Sleep Apnea, [name]/A [name]/A Comorbid History: Arrhythmia, Congestive Heart Failure, Hypertension, Type II Diabetes, Neuropathy 05/13/2022 [name]/A [name]/A Date Acquired:  5 [name]/A [name]/A Weeks of Treatment: Healed - Epithelialized [name]/A [name]/A Wound Status: No [name]/A [name]/A Wound Recurrence: Yes [name]/A [name]/A Clustered Wound: 0x0x0 [name]/A [name]/A Measurements L x W x D (cm) 0 [name]/A [name]/A A (cm) : rea 0 [name]/A [name]/A Volume (cm) : 100.00% [name]/A [name]/A % Reduction in Area: 100.00% [name]/A [name]/A % Reduction in Volume: Full Thickness Without Exposed [name]/A [name]/A Classification: Support Structures None Present [name]/A [name]/A Exudate Amount: None Present (0%) [name]/A [name]/A Granulation Amount: None Present (0%) [name]/A [name]/A Necrotic Amount: Fascia: No [name]/A [name]/A Exposed Structures: Fat Layer (Subcutaneous Tissue): No Tendon: No Muscle: No Joint: No Bone: No Large (67-100%) [name]/A [name]/A Epithelialization: Scarring: Yes [name]/A [name]/A Periwound Skin Texture: No Abnormalities Noted [name]/A [name]/A Periwound Skin Moisture: Hemosiderin Staining: Yes [name]/A [name]/A Periwound Skin Color: No Abnormality [name]/A [name]/A Temperature: Treatment Notes Electronic Signature(s) Signed: 06/23/2022 7:52:26 AM By: Duanne Guess MD FACS Entered By: Duanne Guess on 06/23/2022 07:52:25 -------------------------------------------------------------------------------- Multi-Disciplinary Care Plan Details Patient Name: Date of Service: [name], [name]  06/22/2022 2:45 PM Medical Record Number: [id number] Patient Account Number: [id number] Date of Birth/Sex: Treating RN: 04-16-1945 (77 y.o. [name] Primary Care Marie Chow: Sanda Linger Other Clinician: Referring Nashaun Hillmer: Treating Denesia Donelan/Extender: Royston Sinner in Treatment: 18 Multidisciplinary Care Plan reviewed with physician Active Inactive [place], [place] A ([id number]) [id number] Page 5 of 7 Electronic Signature(s) Signed: 06/22/2022 3:54:03 PM By: Tommie Ard RN Signed: 07/20/2022 1:55:51 PM By: Brenton Grills Entered By: Tommie Ard on 06/22/2022 15:54:03 -------------------------------------------------------------------------------- Pain Assessment Details Patient Name: Date of Service: [name], [name] 06/22/2022 2:45 PM Medical Record Number: [id number] Patient Account Number: [id number] Date of Birth/Sex: Treating RN: 09/07/1945 (77 y.o. [name] Primary Care Lauriel Helin: Sanda Linger Other Clinician: Referring Trindon Dorton: Treating Murtaza Shell/Extender: Royston Sinner in Treatment: 18 Active Problems Location of Pain Severity and Description of Pain Patient Has Paino Yes Site Locations Pain Location: Generalized Pain Duration of the Pain. Constant / Intermittento Constant Rate the pain. Current Pain Level: 3 Worst Pain Level: 3 Character of Pain Describe the Pain: Aching Pain Management and Medication Current Pain Management: Electronic Signature(s) Signed: 07/20/2022 1:55:51 PM By: Brenton Grills Entered By: Brenton Grills on 06/22/2022 15:07:08 -------------------------------------------------------------------------------- Patient/Caregiver Education Details Patient Name: Date of Service: [name] Shann Medal A. 3/21/2024andnbsp2:45 PM Medical Record Number: [id number] Patient Account Number: [id number] Date of Birth/Gender: Treating RN: 08-23-1945 (77 y.o. [name] Primary Care Physician: Sanda Linger Other Clinician: Referring Physician: Treating Physician/Extender: Royston Sinner in Treatment: 18 Education Assessment Education Provided To: Patient Education Topics Provided Wound/Skin Impairment: Handouts: Caring for Your Ulcer Methods: Explain/Verbal [name], [name] A ([id number]) [id number] Page 6 of 7 Responses: State content correctly Electronic Signature(s) Signed: 07/20/2022 1:55:51 PM By: Brenton Grills Entered By: Brenton Grills on 06/22/2022 15:22:21 -------------------------------------------------------------------------------- Wound Assessment Details Patient Name: Date of Service: [name], [name] 06/22/2022 2:45 PM Medical Record Number: [id number] Patient Account Number: [id number] Date of Birth/Sex: Treating RN: [birth year] (77 y.o. [name] Primary Care Epsie Walthall: Sanda Linger Other Clinician: Referring Shykeem Resurreccion: Treating Lorina Duffner/Extender: Royston Sinner in Treatment: 18 Wound Status Wound Number: [id number] Primary Lymphedema Etiology: Wound Location: Left, Anterior Lower Leg Wound Healed - Epithelialized Wounding Event: Gradually Appeared Status: Date Acquired: 05/13/2022 Comorbid Anemia, Lymphedema, Sleep Apnea, Arrhythmia, Congestive Heart Weeks Of Treatment: 5 History: Failure, Hypertension, Type II Diabetes, Neuropathy Clustered Wound: Yes Photos Wound Measurements Length: (cm) Width: (cm) Depth: (cm) Area: (cm) Volume: (  cm) 0 % Reduction in Area: 100% 0 % Reduction in Volume: 100% 0 Epithelialization: Large (67-100%) 0 Tunneling: No 0 Undermining: No Wound Description Classification: Full Thickness Without Exposed Support Structures Exudate Amount: None Present Foul Odor After Cleansing: No Slough/Fibrino No Wound Bed Granulation Amount: None Present (0%) Exposed Structure Necrotic Amount: None Present  (0%) Fascia Exposed: No Fat Layer (Subcutaneous Tissue) Exposed: No Tendon Exposed: No Muscle Exposed: No Joint Exposed: No Bone Exposed: No Periwound Skin Texture Texture Color No Abnormalities Noted: Yes No Abnormalities Noted: No Hemosiderin Staining: Yes Moisture No Abnormalities Noted: Yes Temperature / Pain Temperature: No Abnormality Electronic Signature(s) Signed: 07/20/2022 1:55:51 PM By: [name], [name] A ([id number]) [id number] Page 7 of 7 Entered By: Brenton Grills on 06/22/2022 15:36:01 -------------------------------------------------------------------------------- Vitals Details Patient Name: Date of Service: [name], [name] A. 06/22/2022 2:45 PM Medical Record Number: [id number] Patient Account Number: [id number] Date of Birth/Sex: Treating RN: 17-Jul-1945 (77 y.o. [name] Primary Care Yobani Schertzer: Sanda Linger Other Clinician: Referring Makailyn Mccormick: Treating Ginny Loomer/Extender: Royston Sinner in Treatment: 18 Vital Signs Time Taken: 15:00 Temperature (F): 98.5 Height (in): 72 Pulse (bpm): 52 Weight (lbs): 375 Respiratory Rate (breaths/min): 18 Body Mass Index (BMI): 50.9 Blood Pressure (mmHg): 157/75 Reference Range: 80 - 120 mg / dl Electronic Signature(s) Signed: 07/20/2022 1:55:51 PM By: Brenton Grills Entered By: Brenton Grills on 06/22/2022 15:06:32

## 2022-07-20 NOTE — Progress Notes (Deleted)
Subjective:    Patient ID: [name], male    DOB: Jun 15, 1945, 77 y.o.   MRN: [id number]  HPI   Pain Inventory Average Pain {NUMBERS; 0-10:5044} Pain Right Now {NUMBERS; 0-10:5044} My pain is {PAIN DESCRIPTION:21022940}  In the last 24 hours, has pain interfered with the following? General activity {NUMBERS; 0-10:5044} Relation with others {NUMBERS; 0-10:5044} Enjoyment of life {NUMBERS; 0-10:5044} What TIME of day is your pain at its worst? {time of day:24191} Sleep (in general) {BHH GOOD/FAIR/POOR:22877}  Pain is worse with: {ACTIVITIES:21022942} Pain improves with: {PAIN IMPROVES WUJW:11914782} Relief from Meds: {NUMBERS; 0-10:5044}  Family History  Problem Relation Age of Onset   Depression Mother    Hypertension Mother    Dementia Mother    Heart disease Father        MI   Depression Brother    Stroke Paternal Aunt        CVA   Diabetes Paternal Uncle    Heart disease Paternal Uncle        MI   Heart disease Paternal Grandmother        MI   Diabetes Cousin        PATERNAL   Social History   Socioeconomic History   Marital status: Married    Spouse name: Not on file   Number of children: 1   Years of education: Not on file   Highest education level: Not on file  Occupational History   Occupation: retired-Manager [employer]  Tobacco Use   Smoking status: Former    Packs/day: 0.20    Years: 13.00    Additional pack years: 0.00    Total pack years: 2.60    Types: Cigarettes    Start date: 04/03/1961    Quit date: 08/30/1974    Years since quitting: 47.9   Smokeless tobacco: Never  Vaping Use   Vaping Use: Never used  Substance and Sexual Activity   Alcohol use: No    Alcohol/week: 0.0 standard drinks of alcohol   Drug use: No   Sexual activity: Not Currently  Other Topics Concern   Not on file  Social History Narrative   Lives at home w/ his wife   Right-handed   Caffeine: decaf coffee, occasional tea   Social Determinants of Health    Financial Resource Strain: Low Risk  (12/16/2020)   Overall Financial Resource Strain (CARDIA)    Difficulty of Paying Living Expenses: Not hard at all  Food Insecurity: No Food Insecurity (04/25/2022)   Hunger Vital Sign    Worried About Running Out of Food in the Last Year: Never true    Ran Out of Food in the Last Year: Never true  Transportation Needs: No Transportation Needs (04/25/2022)   PRAPARE - Administrator, Civil Service (Medical): No    Lack of Transportation (Non-Medical): No  Physical Activity: Inactive (04/25/2022)   Exercise Vital Sign    Days of Exercise per Week: 0 days    Minutes of Exercise per Session: 0 min  Stress: No Stress Concern Present (04/25/2022)   [employer] of Occupational Health - Occupational Stress Questionnaire    Feeling of Stress : Not at all  Social Connections: Moderately Isolated (04/25/2022)   Social Connection and Isolation Panel [NHANES]    Frequency of Communication with Friends and Family: More than three times a week    Frequency of Social Gatherings with Friends and Family: More than three times a week    Attends Religious Services:  Never    Active Member of Clubs or Organizations: No    Attends Banker Meetings: Never    Marital Status: Married   Past Surgical History:  Procedure Laterality Date   ARTHROSCOPY KNEE W/ DRILLING     BREATH TEK H PYLORI N/A 12/23/2012   Procedure: BREATH TEK H PYLORI;  Surgeon: Atilano Ina, MD;  Location: [facility] ENDOSCOPY;  Service: General;  Laterality: N/A;   CHOLECYSTECTOMY  1989   COLONOSCOPY N/A 07/31/2013   Procedure: COLONOSCOPY;  Surgeon: Iva Boop, MD;  Location: [facility] ENDOSCOPY;  Service: Endoscopy;  Laterality: N/A;   ESOPHAGOGASTRODUODENOSCOPY N/A 07/22/2013   Procedure: ESOPHAGOGASTRODUODENOSCOPY (EGD);  Surgeon: Hilarie Fredrickson, MD;  Location: [facility] ENDOSCOPY;  Service: Endoscopy;  Laterality: N/A;   ESOPHAGOGASTRODUODENOSCOPY N/A 07/31/2013   Procedure:  ESOPHAGOGASTRODUODENOSCOPY (EGD);  Surgeon: Iva Boop, MD;  Location: [facility] ENDOSCOPY;  Service: Endoscopy;  Laterality: N/A;   INTRAOPERATIVE TRANSESOPHAGEAL ECHOCARDIOGRAM N/A 07/18/2013   Procedure: INTRAOPERATIVE TRANSESOPHAGEAL ECHOCARDIOGRAM;  Surgeon: Delight Ovens, MD;  Location: [facility] OR;  Service: Open Heart Surgery;  Laterality: N/A;   LAPAROSCOPIC GASTRIC BANDING WITH HIATAL HERNIA REPAIR N/A 04/08/2013   Procedure: LAPAROSCOPIC GASTRIC BANDING WITH possible  HIATAL HERNIA REPAIR;  Surgeon: Atilano Ina, MD;  Location: [facility] ORS;  Service: General;  Laterality: N/A;   LITHOTRIPSY     renal calculi     RIGHT AND LEFT HEART CATH N/A 05/12/2021   Procedure: RIGHT AND LEFT HEART CATH;  Surgeon: Dolores Patty, MD;  Location: [facility] INVASIVE CV LAB;  Service: Cardiovascular;  Laterality: N/A;   SUBXYPHOID PERICARDIAL WINDOW N/A 07/18/2013   Procedure: SUBXYPHOID PERICARDIAL WINDOW;  Surgeon: Delight Ovens, MD;  Location: [facility] OR;  Service: Thoracic;  Laterality: N/A;   TONSILLECTOMY     total knee replacment     bilat   trigger finger repair     also lue, cts surgically repaired   WISDOM TOOTH EXTRACTION     Past Surgical History:  Procedure Laterality Date   ARTHROSCOPY KNEE W/ DRILLING     BREATH TEK H PYLORI N/A 12/23/2012   Procedure: BREATH TEK Richardo Priest;  Surgeon: Atilano Ina, MD;  Location: [facility] ENDOSCOPY;  Service: General;  Laterality: N/A;   CHOLECYSTECTOMY  1989   COLONOSCOPY N/A 07/31/2013   Procedure: COLONOSCOPY;  Surgeon: Iva Boop, MD;  Location: [facility] ENDOSCOPY;  Service: Endoscopy;  Laterality: N/A;   ESOPHAGOGASTRODUODENOSCOPY N/A 07/22/2013   Procedure: ESOPHAGOGASTRODUODENOSCOPY (EGD);  Surgeon: Hilarie Fredrickson, MD;  Location: [facility] ENDOSCOPY;  Service: Endoscopy;  Laterality: N/A;   ESOPHAGOGASTRODUODENOSCOPY N/A 07/31/2013   Procedure: ESOPHAGOGASTRODUODENOSCOPY (EGD);  Surgeon: Iva Boop, MD;  Location: [facility] ENDOSCOPY;  Service: Endoscopy;  Laterality: N/A;    INTRAOPERATIVE TRANSESOPHAGEAL ECHOCARDIOGRAM N/A 07/18/2013   Procedure: INTRAOPERATIVE TRANSESOPHAGEAL ECHOCARDIOGRAM;  Surgeon: Delight Ovens, MD;  Location[facility] OR;  Service: Open Heart Surgery;  Laterality: N/A;   LAPAROSCOPIC GASTRIC BANDING WITH HIATAL HERNIA REPAIR N/A 04/08/2013   Procedure: LAPAROSCOPIC GASTRIC BANDING WITH possible  HIATAL HERNIA REPAIR;  Surgeon: Atilano Ina, MD;  Location: [facility] ORS;  Service: General;  Laterality: N/A;   LITHOTRIPSY     renal calculi     RIGHT AND LEFT HEART CATH N/A 05/12/2021   Procedure: RIGHT AND LEFT HEART CATH;  Surgeon: Dolores Patty, MD;  [facility] INVASIVE CV LAB;  Service: Cardiovascular;  Laterality: N/A;   SUBXYPHOID PERICARDIAL WINDOW N/A 07/18/2013   Procedure: SUBXYPHOID PERICARDIAL WINDOW;  Surgeon:  Delight Ovens, MD;  Location: [facility] OR;  Service: Thoracic;  Laterality: N/A;   TONSILLECTOMY     total knee replacment     bilat   trigger finger repair     also lue, cts surgically repaired   WISDOM TOOTH EXTRACTION     Past Medical History:  Diagnosis Date   Anemia 07/29/2013   Asthma    childhood asthma   Benign essential HTN    Benign prostatic hypertrophy    several prostate biopsies neg for cancer.    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  Opioid Risk Score:   Fall Risk Score:  `1  Depression screen PHQ 2/9     06/19/2022    2:24 PM 04/25/2022    1:56 PM 05/24/2021    2:11 PM 12/16/2020    4:15 PM 12/07/2020    1:19 PM 11/09/2020    2:11 PM 05/12/2020    1:54 PM  Depression screen PHQ 2/9  Decreased Interest 0 0 1 0 1 0 0  Down, Depressed, Hopeless 0 0 1 0 1 0 0  PHQ - 2 Score 0 0 2 0 2 0 0  Altered sleeping   0   0 0  Tired, decreased energy   0   0 0  Change in appetite   0   0 0  Feeling bad or failure about yourself    0   0 0  Trouble concentrating   0   0 0  Moving slowly or fidgety/restless   0   0 0  Suicidal thoughts   0   0 0  PHQ-9 Score   2   0 0    Review of Systems     Objective:   Physical Exam        Assessment & Plan:

## 2022-07-21 ENCOUNTER — Encounter: Payer: Medicare Other | Admitting: Registered Nurse

## 2022-07-21 NOTE — Telephone Encounter (Signed)
Called patient about suggestion to start losartan  daily. He is willing to try medication but would like more information on how this will benefit him long-term. Advised will send a message to Dr. Rennis Golden to provide additional explanation

## 2022-07-21 NOTE — Telephone Encounter (Signed)
MyChart message sent to patient with info from MD

## 2022-07-21 NOTE — Telephone Encounter (Signed)
Chrystie Nose, MD  Quintella Reichert (9:20 AM)    Not clear why he is not on that - last seen by Marcelino Duster - renal function ok, creatinine normal. Ok to start losartan 25 mg daily.  Dr. Rexene Edison

## 2022-07-21 NOTE — Telephone Encounter (Signed)
Chrystie Nose, MD  You8 minutes ago (4:49 PM)    A meta-analysis published in 2011, of 37 randomised controlled trials involving 147 020 patients, reported that ARBs reduce the relative risk of stroke by 10% (p=0.007), heart failure by 13% (p<0.001), and new onset diabetes by 15% (p=0.003) compared with placebo or active treatment.  Dr. Rennis Golden

## 2022-07-24 MED ORDER — LOSARTAN POTASSIUM 25 MG PO TABS: 25.0000 mg | ORAL_TABLET | Freq: Every day | ORAL | 3 refills | Status: DC

## 2022-07-24 NOTE — Telephone Encounter (Signed)
Rx(s) sent to pharmacy electronically. BMET ordered MyChart message reply sent

## 2022-07-26 ENCOUNTER — Encounter: Payer: Self-pay | Admitting: Registered Nurse

## 2022-07-26 ENCOUNTER — Encounter: Payer: Medicare Other | Attending: Registered Nurse | Admitting: Registered Nurse

## 2022-07-26 VITALS — BP 126/71 | HR 60 | Ht 72.0 in

## 2022-07-26 DIAGNOSIS — Z5181 Encounter for therapeutic drug level monitoring: Secondary | ICD-10-CM | POA: Diagnosis not present

## 2022-07-26 DIAGNOSIS — I69398 Other sequelae of cerebral infarction: Secondary | ICD-10-CM | POA: Diagnosis not present

## 2022-07-26 DIAGNOSIS — Z79891 Long term (current) use of opiate analgesic: Secondary | ICD-10-CM | POA: Diagnosis not present

## 2022-07-26 DIAGNOSIS — G894 Chronic pain syndrome: Secondary | ICD-10-CM | POA: Diagnosis not present

## 2022-07-26 DIAGNOSIS — M48061 Spinal stenosis, lumbar region without neurogenic claudication: Secondary | ICD-10-CM | POA: Insufficient documentation

## 2022-07-26 DIAGNOSIS — R269 Unspecified abnormalities of gait and mobility: Secondary | ICD-10-CM | POA: Insufficient documentation

## 2022-07-26 MED ORDER — TRAMADOL HCL 50 MG PO TABS: ORAL_TABLET | ORAL | 5 refills | Status: DC

## 2022-07-26 NOTE — Progress Notes (Signed)
Subjective:    Patient ID: [name], male    DOB: [birth year], 77 y.o.   MRN: [id number]  HPI: [name] is a 77 y.o. male who returns for follow up appointment for chronic pain and medication refill. He states his pain is located in his lower back. He.rates his pain 5. His current exercise regime is walking short distances in his home with cane otherwise in his wheelchair   [name] Morphine equivalent is 27.00 MME.  He is also prescribed Lorazepam  by Dr. Lolly Mustache .We have discussed the black box warning of using opioids and benzodiazepines. I highlighted the dangers of using these drugs together and discussed the adverse events including respiratory suppression, overdose, cognitive impairment and importance of compliance with current regimen. We will continue to monitor and adjust as indicated.  he is being closely monitored and under the care of his psychiatrist.   Oral Swab was Performed today.    Pain Inventory Average Pain 5 Pain Right Now 5 My pain is sharp, dull, tingling, and aching  In the last 24 hours, has pain interfered with the following? General activity 6 Relation with others 4 Enjoyment of life 5 What TIME of day is your pain at its worst? daytime Sleep (in general) Fair  Pain is worse with: walking, bending, and standing Pain improves with: rest and medication Relief from Meds: 7  Family History  Problem Relation Age of Onset   Depression Mother    Hypertension Mother    Dementia Mother    Heart disease Father        MI   Depression Brother    Stroke Paternal Aunt        CVA   Diabetes Paternal Uncle    Heart disease Paternal Uncle        MI   Heart disease Paternal Grandmother        MI   Diabetes Cousin        PATERNAL   Social History   Socioeconomic History   Marital status: Married    Spouse name: Not on file   Number of children: 1   Years of education: Not on file   Highest education level: Not on file  Occupational  History   Occupation: retired-Manager [employer]  Tobacco Use   Smoking status: Former    Packs/day: 0.20    Years: 13.00    Additional pack years: 0.00    Total pack years: 2.60    Types: Cigarettes    Start date: 04/03/1961    Quit date: 08/30/1974    Years since quitting: 47.9   Smokeless tobacco: Never  Vaping Use   Vaping Use: Never used  Substance and Sexual Activity   Alcohol use: No    Alcohol/week: 0.0 standard drinks of alcohol   Drug use: No   Sexual activity: Not Currently  Other Topics Concern   Not on file  Social History Narrative   Lives at home w/ his wife   Right-handed   Caffeine: decaf coffee, occasional tea   Social Determinants of Health   Financial Resource Strain: Low Risk  (12/16/2020)   Overall Financial Resource Strain (CARDIA)    Difficulty of Paying Living Expenses: Not hard at all  Food Insecurity: No Food Insecurity (04/25/2022)   Hunger Vital Sign    Worried About Running Out of Food in the Last Year: Never true    Ran Out of Food in the Last Year: Never true  Transportation Needs:  No Transportation Needs (04/25/2022)   PRAPARE - Administrator, Civil Service (Medical): No    Lack of Transportation (Non-Medical): No  Physical Activity: Inactive (04/25/2022)   Exercise Vital Sign    Days of Exercise per Week: 0 days    Minutes of Exercise per Session: 0 min  Stress: No Stress Concern Present (04/25/2022)   [employer] of Occupational Health - Occupational Stress Questionnaire    Feeling of Stress : Not at all  Social Connections: Moderately Isolated (04/25/2022)   Social Connection and Isolation Panel [NHANES]    Frequency of Communication with Friends and Family: More than three times a week    Frequency of Social Gatherings with Friends and Family: More than three times a week    Attends Religious Services: Never    Database administrator or Organizations: No    Attends Engineer, structural: Never    Marital  Status: Married   Past Surgical History:  Procedure Laterality Date   ARTHROSCOPY KNEE W/ DRILLING     BREATH TEK H PYLORI N/A 12/23/2012   Procedure: BREATH TEK H PYLORI;  Surgeon: Atilano Ina, MD;  Location: [facility] ENDOSCOPY;  Service: General;  Laterality: N/A;   CHOLECYSTECTOMY  1989   COLONOSCOPY N/A 07/31/2013   Procedure: COLONOSCOPY;  Surgeon: Iva Boop, MD;  Location: [facility] ENDOSCOPY;  Service: Endoscopy;  Laterality: N/A;   ESOPHAGOGASTRODUODENOSCOPY N/A 07/22/2013   Procedure: ESOPHAGOGASTRODUODENOSCOPY (EGD);  Surgeon: Hilarie Fredrickson, MD;  Location: [facility] ENDOSCOPY;  Service: Endoscopy;  Laterality: N/A;   ESOPHAGOGASTRODUODENOSCOPY N/A 07/31/2013   Procedure: ESOPHAGOGASTRODUODENOSCOPY (EGD);  Surgeon: Iva Boop, MD;  Location: [facility] ENDOSCOPY;  Service: Endoscopy;  Laterality: N/A;   INTRAOPERATIVE TRANSESOPHAGEAL ECHOCARDIOGRAM N/A 07/18/2013   Procedure: INTRAOPERATIVE TRANSESOPHAGEAL ECHOCARDIOGRAM;  Surgeon: Delight Ovens, MD;  Location: [facility] OR;  Service: Open Heart Surgery;  Laterality: N/A;   LAPAROSCOPIC GASTRIC BANDING WITH HIATAL HERNIA REPAIR N/A 04/08/2013   Procedure: LAPAROSCOPIC GASTRIC BANDING WITH possible  HIATAL HERNIA REPAIR;  Surgeon: Atilano Ina, MD;  Location: [facility] ORS;  Service: General;  Laterality: N/A;   LITHOTRIPSY     renal calculi     RIGHT AND LEFT HEART CATH N/A 05/12/2021   Procedure: RIGHT AND LEFT HEART CATH;  Surgeon: Dolores Patty, MD;  Location: [facility] INVASIVE CV LAB;  Service: Cardiovascular;  Laterality: N/A;   SUBXYPHOID PERICARDIAL WINDOW N/A 07/18/2013   Procedure: SUBXYPHOID PERICARDIAL WINDOW;  Surgeon: Delight Ovens, MD;  Location: [facility] OR;  Service: Thoracic;  Laterality: N/A;   TONSILLECTOMY     total knee replacment     bilat   trigger finger repair     also lue, cts surgically repaired   WISDOM TOOTH EXTRACTION     Past Surgical History:  Procedure Laterality Date   ARTHROSCOPY KNEE W/ DRILLING     BREATH TEK H PYLORI N/A  12/23/2012   Procedure: BREATH TEK Richardo Priest;  Surgeon: Atilano Ina, MD;  Location: [facility] ENDOSCOPY;  Service: General;  Laterality: N/A;   CHOLECYSTECTOMY  1989   COLONOSCOPY N/A 07/31/2013   Procedure: COLONOSCOPY;  Surgeon: Iva Boop, MD;  Location: [facility] ENDOSCOPY;  Service: Endoscopy;  Laterality: N/A;   ESOPHAGOGASTRODUODENOSCOPY N/A 07/22/2013   Procedure: ESOPHAGOGASTRODUODENOSCOPY (EGD);  Surgeon: Hilarie Fredrickson, MD;  Location: [facility] ENDOSCOPY;  Service: Endoscopy;  Laterality: N/A;   ESOPHAGOGASTRODUODENOSCOPY N/A 07/31/2013   Procedure: ESOPHAGOGASTRODUODENOSCOPY (EGD);  Surgeon: Iva Boop, MD;  Location: [facility] ENDOSCOPY;  Service: Endoscopy;  Laterality: N/A;   INTRAOPERATIVE TRANSESOPHAGEAL ECHOCARDIOGRAM N/A 07/18/2013   Procedure: INTRAOPERATIVE TRANSESOPHAGEAL ECHOCARDIOGRAM;  Surgeon: Delight Ovens, MD;  Location: [facility] OR;  Service: Open Heart Surgery;  Laterality: N/A;   LAPAROSCOPIC GASTRIC BANDING WITH HIATAL HERNIA REPAIR N/A 04/08/2013   Procedure: LAPAROSCOPIC GASTRIC BANDING WITH possible  HIATAL HERNIA REPAIR;  Surgeon: Atilano Ina, MD;  Location: [facility] ORS;  Service: General;  Laterality: N/A;   LITHOTRIPSY     renal calculi     RIGHT AND LEFT HEART CATH N/A 05/12/2021   Procedure: RIGHT AND LEFT HEART CATH;  Surgeon: Dolores Patty, MD;  Location: [facility] INVASIVE CV LAB;  Service: Cardiovascular;  Laterality: N/A;   SUBXYPHOID PERICARDIAL WINDOW N/A 07/18/2013   Procedure: SUBXYPHOID PERICARDIAL WINDOW;  Surgeon: Delight Ovens, MD;  Location: [facility] OR;  Service: Thoracic;  Laterality: N/A;   TONSILLECTOMY     total knee replacment     bilat   trigger finger repair     also lue, cts surgically repaired   WISDOM TOOTH EXTRACTION     Past Medical History:  Diagnosis Date   Anemia 07/29/2013   Asthma    childhood asthma   Benign essential HTN    Benign prostatic hypertrophy    several prostate biopsies neg for cancer.    Bilateral hip pain    Cancer    CHF (congestive  heart failure)    Chronic anticoagulation 07/22/2013   Complication of anesthesia    MORPHINE CAUSES HALLUCINATIONS   CVA (cerebral vascular accident) 06/2016   Depression    Diastolic congestive heart failure 07/16/2013   Dysphagia, post-stroke    Embolic cerebral infarction 06/21/2016   FH: colonic polyps 2010   Gout    History of kidney stones    Hypertension    Kidney cysts    Morbid obesity    Numbness    feet / legs   PAF (paroxysmal atrial fibrillation) with RVR 07/29/2013   Pericarditis 2015   s/p window   Recurrent pulmonary emboli 06/13/2015   Sleep apnea    USES C PAP   Stroke syndrome    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   Ht 6' (1.829 m)   BMI 49.50 kg/m   Opioid Risk Score:   Fall Risk Score:  `1  Depression screen PHQ 2/9     06/19/2022    2:24 PM 04/25/2022    1:56 PM 05/24/2021    2:11 PM 12/16/2020    4:15 PM 12/07/2020    1:19 PM 11/09/2020    2:11 PM 05/12/2020    1:54 PM  Depression screen PHQ 2/9  Decreased Interest 0 0 1 0 1 0 0  Down, Depressed, Hopeless 0 0 1 0 1 0 0  PHQ - 2 Score 0 0 2 0 2 0 0  Altered sleeping   0   0 0  Tired, decreased energy   0   0 0  Change in appetite   0   0 0  Feeling bad or failure about yourself    0   0 0  Trouble concentrating   0   0 0  Moving slowly or fidgety/restless   0   0 0  Suicidal thoughts   0   0 0  PHQ-9 Score   2   0 0      Review of Systems  Musculoskeletal:  Positive for back pain and gait problem.  All other systems reviewed and are negative.  Objective:   Physical Exam Vitals and nursing note reviewed.  Constitutional:      Appearance: Normal appearance. He is obese.  Cardiovascular:     Rate and Rhythm: Normal rate and regular rhythm.     Pulses: Normal pulses.     Heart sounds: Normal heart sounds.  Musculoskeletal:     Cervical back: Normal range of motion and neck supple.     Right lower leg: Edema present.     Left lower leg: Edema present.     Comments: Normal Muscle Bulk and  Muscle Testing Reveals:  Upper Extremities: Full ROM and Muscle Strength 5/5 Lumbar Paraspinal Tenderness: L-3-L-4 Lower Extremities: Full ROM and Muscle Strength 5/5 Bilateral Velcro Wraps Arrived in wheelchair        Skin:    General: Skin is warm and dry.  Neurological:     Mental Status: He is alert and oriented to person, place, and time.  Psychiatric:        Mood and Affect: Mood normal.        Behavior: Behavior normal.         Assessment & Plan:  1.Chronic Low Back Pain/  Lumbosacral Spondylosis without myelopathy: S/P 09/03/2020:  Bilateral Lumbar L3, L4 medial branch blocks and L 5 dorsal ramus injection under fluoroscopic guidance   with good relief noted. Encouraged to increase HEP as tolerated. Continue to monitor. 07/26/2022 2. Chronic Deconditioning/ Morbid Obesity History of Wallenberg Syndrome: Continue  HEP as Tolerated. He verbalizes understanding. Continue to Monitor. 07/26/2022 3. Chronic Pain Syndrome: Continue Tramadol  50 mg one tablet three times a day as needed for pain #90. Continue to monitor.  We will continue the opioid monitoring program, this consists of regular clinic visits, examinations, urine drug screen, pill counts as well as use of [place] Controlled Substance Reporting system.07/26/2022  F/U in 6 months

## 2022-07-29 LAB — DRUG TOX MONITOR 1 W/CONF, ORAL FLD
Alprazolam: NEGATIVE ng/mL (ref <0.50)
Amphetamines: NEGATIVE ng/mL (ref <10)
Barbiturates: NEGATIVE ng/mL (ref <10)
Benzodiazepines: POSITIVE ng/mL — AB (ref <0.50)
Buprenorphine: NEGATIVE ng/mL (ref <0.10)
Chlordiazepoxide: NEGATIVE ng/mL (ref <0.50)
Clonazepam: NEGATIVE ng/mL (ref <0.50)
Cocaine: NEGATIVE ng/mL (ref <5.0)
Diazepam: NEGATIVE ng/mL (ref <0.50)
Fentanyl: NEGATIVE ng/mL (ref <0.10)
Flunitrazepam: NEGATIVE ng/mL (ref <0.50)
Flurazepam: NEGATIVE ng/mL (ref <0.50)
Heroin Metabolite: NEGATIVE ng/mL (ref <1.0)
Lorazepam: 0.59 ng/mL — ABNORMAL HIGH (ref <0.50)
MARIJUANA: NEGATIVE ng/mL (ref <2.5)
MDMA: NEGATIVE ng/mL (ref <10)
Meprobamate: NEGATIVE ng/mL (ref <2.5)
Methadone: NEGATIVE ng/mL (ref <5.0)
Midazolam: NEGATIVE ng/mL (ref <0.50)
Nicotine Metabolite: NEGATIVE ng/mL (ref <5.0)
Nordiazepam: NEGATIVE ng/mL (ref <0.50)
Opiates: NEGATIVE ng/mL (ref <2.5)
Oxazepam: NEGATIVE ng/mL (ref <0.50)
Phencyclidine: NEGATIVE ng/mL (ref <10)
Tapentadol: NEGATIVE ng/mL (ref <5.0)
Temazepam: NEGATIVE ng/mL (ref <0.50)
Tramadol: >500 ng/mL — ABNORMAL HIGH (ref <5.0)
Tramadol: POSITIVE ng/mL — AB (ref <5.0)
Triazolam: NEGATIVE ng/mL (ref <0.50)
Zolpidem: NEGATIVE ng/mL (ref <5.0)

## 2022-07-29 LAB — DRUG TOX ALC METAB W/CON, ORAL FLD: Alcohol Metabolite: NEGATIVE ng/mL (ref <25)

## 2022-07-30 NOTE — Progress Notes (Deleted)
HPI male former smoker followed for OSA, Restless Legs,, complicated by A. fib, recurrent PE, bariatric surgery, major depression, dysphonia, midbrain CVA NPSG 10/25/05- severe OSA, AHI 79/ hr, CPAP to 16. Frequent limb jerks with sleep disturbance PFT 01/03/17-minimal obstruction without response to bronchodilator.  FVC 3.94/83%, FEV1 3.00/86%, ratio 2.76, TLC 96%, DLCO 78% Walk test on room air 04/08/2018-  Max HR 91, Min Sat 92% Walk Test on room air 04/26/2021-he was barely able to walk into the bathroom and out on room air before he desaturated to 80%. Arrival room air saturation today at rest 92%. -----------------------------------------------------------------------------------    08/01/21- 77 year old male former smoker followed for OSA, Restless Legs, complicated by A. Fib, diastolic CHF recurrent PE, bariatric surgery/ Morbid Obesity,  major depression, dysphonia, midbrain CVA, DM2, Gastric Ulcer, Pulmonary Hypertension(worse with exertion),  -Melatonin, Lorazepam, Requip, Zoloft,  BiPap 25/ 22/Adapt O2 2l/ [employer]     Download-compliance - 90%, AHI 7.3/ hr Body weight today-376 lbs            Arrival O2 sat 95% Covid vax-4 Phizer                         Flu vax-had Noted by cardiology to have rapid rise in PA pressure with minimal exertion.PFT in 2018- minimal obstruction. Little cough or wheeze. Cardiology will guide management of PAH. He continues O2 2L.  CTa chest 03/22/21 did not show evidence of chronic PE. Some insomnia, managed with melatonin and lorazepam.  08/03/22- 77 year old male former smoker followed for OSA, Restless Legs, complicated by A. Fib, diastolic CHF recurrent PE, bariatric surgery/ Morbid Obesity,  major depression, dysphonia, midbrain CVA, DM2, Gastric Ulcer, Pulmonary Hypertension(worse with exertion),  -Melatonin, Lorazepam, Requip, Zoloft,  BiPap 25/ 22/Adapt O2 2l/ [employer]     Download-compliance -  Body weight today-     ROS-see HPI   + =  positive Constitutional:    weight loss, night sweats, fevers, chills, fatigue, lassitude. HEENT:    headaches, difficulty swallowing, tooth/dental problems, sore throat,       sneezing, itching, ear ache,+ nasal congestion, post nasal drip, snoring CV:    chest pain, orthopnea, PND, swelling in lower extremities, anasarca,                                                 dizziness, +palpitations Resp:   +shortness of breath with exertion or at rest.                productive cough,   non-productive cough, coughing up of blood.              change in color of mucus.  wheezing.   Skin:    rash or lesions. GI:  No-   heartburn, indigestion, abdominal pain, nausea, vomiting,  GU:  MS:   joint pain, stiffness, Neuro-     nothing unusual Psych:  change in mood or affect.  depression or anxiety.   memory loss.  OBJ- Physical Exam General- Alert, Oriented, Affect-appropriate, Distress- none acute, + morbidly obese, +power scooter Skin- rash-none, lesions- none, excoriation- none Lymphadenopathy- none Head- atraumatic            Eyes- Gross vision intact, PERRLA, conjunctivae and secretions clear            Ears- +Hearing aid  Nose-  +moderate turbinate edema and mucus congestion, no-Septal dev, mucus, polyps,  erosion, perforation             Throat- Mallampati III , mucosa clear , drainage- none, tonsils- atrophic, + hoarse Neck- flexible , trachea midline, no stridor , thyroid nl, carotid no bruit Chest - symmetrical excursion , unlabored           Heart/CV- RRR , no murmur , no gallop  , no rub, nl s1 s2                           - JVD- none , edema- none, stasis changes- none, varices- none           Lung- clear to P&A, Unlabored during quiet conversation, wheeze- none, cough- none , dullness-none, rub- none           Chest wall-  Abd-  Br/ Gen/ Rectal- Not done, not indicated Extrem- cyanosis- none, clubbing, none, atrophy- none, strength- nl Neuro- grossly intact to  observation

## 2022-08-03 ENCOUNTER — Ambulatory Visit: Payer: Medicare Other | Admitting: Internal Medicine

## 2022-08-07 ENCOUNTER — Encounter (HOSPITAL_COMMUNITY): Payer: Self-pay | Admitting: Psychiatry

## 2022-08-07 ENCOUNTER — Telehealth (HOSPITAL_BASED_OUTPATIENT_CLINIC_OR_DEPARTMENT_OTHER): Payer: Medicare Other | Admitting: Psychiatry

## 2022-08-07 VITALS — Wt 365.0 lb

## 2022-08-07 DIAGNOSIS — F419 Anxiety disorder, unspecified: Secondary | ICD-10-CM | POA: Diagnosis not present

## 2022-08-07 DIAGNOSIS — F330 Major depressive disorder, recurrent, mild: Secondary | ICD-10-CM | POA: Diagnosis not present

## 2022-08-07 MED ORDER — LORAZEPAM 0.5 MG PO TABS: 0.5000 mg | ORAL_TABLET | Freq: Every day | ORAL | 2 refills | Status: DC

## 2022-08-07 MED ORDER — SERTRALINE HCL 100 MG PO TABS: 150.0000 mg | ORAL_TABLET | Freq: Every day | ORAL | 0 refills | Status: DC

## 2022-08-07 NOTE — Progress Notes (Signed)
[facility] MD Virtual Progress Note   Patient Location: Home Provider Location: Home Office  I connect with patient by telephone and verified that I am speaking with correct person by using two identifiers. I discussed the limitations of evaluation and management by telemedicine and the availability of in person appointments. I also discussed with the patient that there may be a patient responsible charge related to this service. The patient expressed understanding and agreed to proceed.  [name] [id number] 77 y.o.  08/07/2022 1:43 PM  History of Present Illness:  Patient is evaluated by phone session.  He reported general health is deteriorating.  He cannot walk from 1 room for the room without the help of cane or walker.  He is using oxygen all the time.  He gets easily shortness of breath.  He admitted some sadness because not able to see the grandkids as frequently as he like to.  He does not travel or drive and feels sometimes dependent on other people.  He reported his son now retired from [employer] and has his own business and lately very busy because business is going very well.  He is taking Ativan and Zoloft.  He denies any major panic attacks or any crying spells.  He denies any feeling of hopelessness or any suicidal thoughts.  His granddaughter is 31-year-old.  Patient lives with his wife who is very supportive.  Denies any hallucination, paranoia, anger.  He is trying to lose weight and he had lost 10 pounds since the last visit.  He admitted not able to walk as much because he gets shortness of breath but trying to watch his calorie intake and that has been successful to lose some weight.  He like to follow-up in 3 months.  He has no issue with Zoloft and Ativan.  Past Psychiatric History: H/O inpatient in 2006 for depression and having suicidal thoughts. Took Risperdal but d/c after stroke in March 2018.    Outpatient Encounter Medications as of 08/07/2022   Medication Sig   atorvastatin (LIPITOR) 80 MG tablet TAKE 1 TABLET(80 MG) BY MOUTH DAILY   carboxymethylcellulose (REFRESH PLUS) 0.5 % SOLN Place 1 drop into both eyes 3 (three) times daily as needed (dry eyes).   [employer] MELATONIN GUMMIES PO Take 5 mg by mouth at bedtime.   Cyanocobalamin 1000 MCG SUBL Take 1,000 mcg by mouth daily.   Docusate Sodium (DSS) 100 MG CAPS Take 100 mg by mouth every other day.   ELIQUIS 5 MG TABS tablet TAKE 1 TABLET(5 MG) BY MOUTH TWICE DAILY   finasteride (PROSCAR) 5 MG tablet Take 1 tablet (5 mg total) by mouth daily.   flecainide (TAMBOCOR) 50 MG tablet TAKE 1 TABLET(50 MG) BY MOUTH EVERY 12 HOURS   JARDIANCE 10 MG TABS tablet TAKE 1 TABLET(10 MG) BY MOUTH DAILY BEFORE BREAKFAST   loratadine (CLARITIN) 10 MG tablet Take 10 mg by mouth daily.   LORazepam (ATIVAN) 0.5 MG tablet Take 1 tablet by mouth at bedtime.   losartan (COZAAR) 25 MG tablet Take 1 tablet (25 mg total) by mouth daily.   metoprolol succinate (TOPROL-XL) 25 MG 24 hr tablet TAKE 1 TABLET(25 MG) BY MOUTH DAILY   montelukast (SINGULAIR) 10 MG tablet TAKE 1 TABLET(10 MG) BY MOUTH AT BEDTIME   Multiple Vitamin (MULTIVITAMIN WITH MINERALS) TABS tablet Take 1 tablet by mouth daily.   pantoprazole (PROTONIX) 40 MG tablet TAKE 1 TABLET(40 MG) BY MOUTH DAILY   potassium chloride SA (KLOR-CON M)  20 MEQ tablet Take 1 tablet (20 mEq total) by mouth 2 (two) times daily.   rOPINIRole (REQUIP) 0.5 MG tablet TAKE 1 TABLET(0.5 MG) BY MOUTH THREE TIMES DAILY   sertraline (ZOLOFT) 100 MG tablet Take 1.5 tablets (150 mg total) by mouth at bedtime.   torsemide (DEMADEX) 20 MG tablet TAKE 3 TABLETS BY MOUTH EVERY DAY   traMADol (ULTRAM) 50 MG tablet TAKE 1 TABLET BY MOUTH EVERY 8 HOURS AS NEEDED   zinc gluconate 50 MG tablet Take 1 tablet (50 mg total) by mouth daily.   No facility-administered encounter medications on file as of 08/07/2022.    Recent Results (from the past 2160 hour(s))  PSA     Status: None    Collection Time: 06/19/22  3:12 PM  Result Value Ref Range   PSA 0.31 0.10 - 4.00 ng/mL    Comment: Test performed using Access [employer]ch PSA Assay, a parmagnetic partical, chemiluminecent immunoassay.  Basic metabolic panel     Status: Abnormal   Collection Time: 06/19/22  3:12 PM  Result Value Ref Range   Sodium 142 135 - 145 mEq/L   Potassium 4.2 3.5 - 5.1 mEq/L   Chloride 97 96 - 112 mEq/L   CO2 38 (H) 19 - 32 mEq/L   Glucose, Bld 109 (H) 70 - 99 mg/dL   BUN 34 (H) 6 - 23 mg/dL   Creatinine, Ser 1.61 0.40 - 1.50 mg/dL   GFR 09.60 >45.40 mL/min    Comment: Calculated using the CKD-EPI Creatinine Equation (2021)   Calcium 8.7 8.4 - 10.5 mg/dL  CBC with Differential/Platelet     Status: Abnormal   Collection Time: 06/19/22  3:12 PM  Result Value Ref Range   WBC 9.5 4.0 - 10.5 K/uL   RBC 4.36 4.22 - 5.81 Mil/uL   Hemoglobin 12.8 (L) 13.0 - 17.0 g/dL   HCT 98.1 19.1 - 47.8 %   MCV 90.1 78.0 - 100.0 fl   MCHC 32.6 30.0 - 36.0 g/dL   RDW 29.5 (H) 62.1 - 30.8 %   Platelets 233.0 150.0 - 400.0 K/uL   Neutrophils Relative % 73.8 43.0 - 77.0 %   Lymphocytes Relative 13.6 12.0 - 46.0 %   Monocytes Relative 10.3 3.0 - 12.0 %   Eosinophils Relative 1.7 0.0 - 5.0 %   Basophils Relative 0.6 0.0 - 3.0 %   Neutro Abs 7.0 1.4 - 7.7 K/uL   Lymphs Abs 1.3 0.7 - 4.0 K/uL   Monocytes Absolute 1.0 0.1 - 1.0 K/uL   Eosinophils Absolute 0.2 0.0 - 0.7 K/uL   Basophils Absolute 0.1 0.0 - 0.1 K/uL  Hemoglobin A1c     Status: None   Collection Time: 06/19/22  3:12 PM  Result Value Ref Range   Hgb A1c MFr Bld 6.4 4.6 - 6.5 %    Comment: Glycemic Control Guidelines for People with Diabetes:Non Diabetic:  <6%Goal of Therapy: <7%Additional Action Suggested:  >8%   Urinalysis, Routine w reflex microscopic     Status: Abnormal   Collection Time: 06/19/22  3:12 PM  Result Value Ref Range   Color, Urine YELLOW Yellow;Lt. Yellow;Straw;Dark Yellow;Amber;Green;Red;Brown   APPearance CLEAR  Clear;Turbid;Slightly Cloudy;Cloudy   Specific Gravity, Urine 1.010 1.000 - 1.030   pH 6.0 5.0 - 8.0   Total Protein, Urine NEGATIVE Negative   Urine Glucose 500 (A) Negative   Ketones, ur NEGATIVE Negative   Bilirubin Urine NEGATIVE Negative   Hgb urine dipstick NEGATIVE Negative   Urobilinogen, UA 1.0  0.0 - 1.0   Leukocytes,Ua NEGATIVE Negative   Nitrite NEGATIVE Negative   WBC, UA 0-2/hpf 0-2/hpf   RBC / HPF 0-2/hpf 0-2/hpf   Squamous Epithelial / HPF Rare(0-4/hpf) Rare(0-4/hpf)  Microalbumin / creatinine urine ratio     Status: None   Collection Time: 06/19/22  3:12 PM  Result Value Ref Range   Microalb, Ur 0.7 0.0 - 1.9 mg/dL   Creatinine,U 09.8 mg/dL   Microalb Creat Ratio 1.1 0.0 - 30.0 mg/g  Drug Tox Monitor 1 w/Conf, Oral Fld     Status: Abnormal   Collection Time: 07/26/22  2:26 PM  Result Value Ref Range   Amphetamines NEGATIVE <10 ng/mL   Barbiturates NEGATIVE <10 ng/mL   Benzodiazepines POSITIVE (A) <0.50 ng/mL   Alprazolam Negative <0.50 ng/mL   Chlordiazepoxide Negative <0.50 ng/mL   Clonazepam Negative <0.50 ng/mL   Diazepam Negative <0.50 ng/mL   Flunitrazepam Negative <0.50 ng/mL   Flurazepam Negative <0.50 ng/mL   Lorazepam 0.59 (H) <0.50 ng/mL   Midazolam Negative <0.50 ng/mL   Nordiazepam Negative <0.50 ng/mL   Oxazepam Negative <0.50 ng/mL   Temazepam Negative <0.50 ng/mL   Triazolam Negative <0.50 ng/mL   Buprenorphine NEGATIVE <0.10 ng/mL   Cocaine NEGATIVE <5.0 ng/mL   Fentanyl NEGATIVE <0.10 ng/mL   Heroin Metabolite NEGATIVE <1.0 ng/mL   MARIJUANA NEGATIVE <2.5 ng/mL   MDMA NEGATIVE <10 ng/mL   Meprobamate NEGATIVE <2.5 ng/mL   Methadone NEGATIVE <5.0 ng/mL   Nicotine Metabolite NEGATIVE <5.0 ng/mL   Opiates NEGATIVE <2.5 ng/mL   Phencyclidine NEGATIVE <10 ng/mL   Tapentadol NEGATIVE <5.0 ng/mL   Tramadol POSITIVE (A) <5.0 ng/mL   Tramadol >500.0 (H) <5.0 ng/mL   Zolpidem NEGATIVE <5.0 ng/mL    Comment: . For additional information,  please refer to http://education.[facility].com/faq/FAQ186 (This link is being provided for informational/ educational purposes only.) . This drug testing is for medical treatment only. Analysis was performed as non-forensic testing and these results should be used only by healthcare providers to render diagnosis or treatment, or to monitor progress of medical conditions. . For assistance with interpreting these drug results, please contact a [employer] Toxicology Specialist: [phone] TOX [phone]), M-F, 8am-6pm EST. [name] These tests were developed and their analytical performance characteristics have been determined by [facility]. They have not been cleared or approved by the FDA. These assays have been validated pursuant to the CLIA regulations and are used for clinical purposes.   Drug Tox Alc Metab w/Con, Oral Fld     Status: None   Collection Time: 07/26/22  2:26 PM  Result Value Ref Range   Alcohol Metabolite NEGATIVE <25 ng/mL    Comment: . For additional information, please refer to http://education.[facility].com/faq/FAQ183 (This link is being provided for informational/ educational purposes only.) . This drug testing is for medical treatment only. Analysis was performed as non-forensic testing and these results should be used only by healthcare providers to render diagnosis or treatment, or to monitor progress of medical conditions. . For assistance with interpreting these drug results, please contact a [employer] Toxicology Specialist: [phone] TOX [phone]), M-F, 8am-6pm EST. [name] These tests were developed and their analytical performance characteristics have been determined by [facility]. They have not been cleared or approved by the FDA. These assays have been validated pursuant to the CLIA regulations and are used for clinical purposes.      Psychiatric Specialty Exam: Physical Exam  Review  of Systems  Constitutional:  Positive for fatigue.  Respiratory:  Positive for shortness of breath.        On BiPap  Neurological:        Needs cain and walker to help balance    Weight (!) 365 lb (165.6 kg).There is no height or weight on file to calculate BMI.  General Appearance: NA  Eye Contact:  NA  Speech:  Slow  Volume:  Decreased  Mood:  Anxious  Affect:  NA  Thought Process:  Descriptions of Associations: Intact  Orientation:  Full (Time, Place, and Person)  Thought Content:  Rumination  Suicidal Thoughts:  No  Homicidal Thoughts:  No  Memory:  Immediate;   Fair Recent;   Fair Remote;   Fair  Judgement:  Intact  Insight:  Present  Psychomotor Activity:  Decreased  Concentration:  Concentration: Fair and Attention Span: Fair  Recall:  [employer] of Knowledge:  Fair  Language:  Good  Akathisia:  No  Handed:  Right  AIMS (if indicated):     Assets:  Communication Skills Desire for Improvement Housing Social Support  ADL's:  Intact  Cognition:  WNL  Sleep:  fair with BiPAP     Assessment/Plan: Major depressive disorder, recurrent episode, mild (HCC) - Plan: LORazepam (ATIVAN) 0.5 MG tablet, sertraline (ZOLOFT) 100 MG tablet  Anxiety - Plan: LORazepam (ATIVAN) 0.5 MG tablet  I reviewed blood work results.  Patient is stable on medication but sometimes gets very sad and dysphoric when not able to see his 43-year-old grandchild.  His son is also very busy since he retired from [employer] and doing his own business.  He is happy for his son because business is going well but he wished he can see him more frequently.  He does not want to change the medication.  Continue Zoloft 150 mg daily and Ativan 0.5 mg at bedtime.  Patient like to have a follow-up sooner than 6 months.  Denies any side effects.  We will follow-up in 3 months however recommended to call us back if he feels worsening of symptoms.  Patient not interested in therapy.   Follow Up Instructions:     I  discussed the assessment and treatment plan with the patient. The patient was provided an opportunity to ask questions and all were answered. The patient agreed with the plan and demonstrated an understanding of the instructions.   The patient was advised to call back or seek an in-person evaluation if the symptoms worsen or if the condition fails to improve as anticipated.    Collaboration of Care: Other provider involved in patient's care AEB notes available in epic to review.  Patient/Guardian was advised Release of Information must be obtained prior to any record release in order to collaborate their care with an outside provider. Patient/Guardian was advised if they have not already done so to contact the registration department to sign all necessary forms in order for [place] to release information regarding their care.   Consent: Patient/Guardian gives verbal consent for treatment and assignment of benefits for services provided during this visit. Patient/Guardian expressed understanding and agreed to proceed.     I provided 20 minutes of non face to face time during this encounter.  Note: This document was prepared by [employer] voice dictation technology and any errors that results from this process are unintentional.    Cleotis Nipper, MD 08/07/2022

## 2022-08-14 ENCOUNTER — Ambulatory Visit: Payer: Medicare Other | Admitting: Adult Health

## 2022-08-22 ENCOUNTER — Encounter: Payer: Self-pay | Admitting: Internal Medicine

## 2022-08-22 ENCOUNTER — Ambulatory Visit: Payer: Medicare Other | Attending: Internal Medicine | Admitting: Internal Medicine

## 2022-08-22 VITALS — BP 136/60 | HR 52 | Ht 72.0 in | Wt 367.0 lb

## 2022-08-22 DIAGNOSIS — I480 Paroxysmal atrial fibrillation: Secondary | ICD-10-CM

## 2022-08-22 DIAGNOSIS — R0609 Other forms of dyspnea: Secondary | ICD-10-CM

## 2022-08-22 DIAGNOSIS — I5032 Chronic diastolic (congestive) heart failure: Secondary | ICD-10-CM | POA: Diagnosis not present

## 2022-08-22 DIAGNOSIS — I272 Pulmonary hypertension, unspecified: Secondary | ICD-10-CM

## 2022-08-22 DIAGNOSIS — R600 Localized edema: Secondary | ICD-10-CM | POA: Diagnosis not present

## 2022-08-22 NOTE — Patient Instructions (Signed)
Medication Instructions:  NO CHANGES  *If you need a refill on your cardiac medications before your next appointment, please call your pharmacy*    Follow-Up: At [facility], you and your health needs are our priority.  As part of our continuing mission to provide you with exceptional heart care, we have created designated Provider Care Teams.  These Care Teams include your primary Cardiologist (physician) and Advanced Practice Providers (APPs -  Physician Assistants and Nurse Practitioners) who all work together to provide you with the care you need, when you need it.  We recommend signing up for the patient portal called "MyChart".  Sign up information is provided on this After Visit Summary.  MyChart is used to connect with patients for Virtual Visits (Telemedicine).  Patients are able to view lab/test results, encounter notes, upcoming appointments, etc.  Non-urgent messages can be sent to your provider as well.   To learn more about what you can do with MyChart, go to [email or web address].    Your next appointment:    6 months with Dr. [name] 

## 2022-08-22 NOTE — Progress Notes (Signed)
OFFICE NOTE  Chief Complaint:  Follow-up dyspnea  Primary Care Physician: [name], MD  HPI:  CLAVIN JOHNSEY is a 77 y/o male with a history of bariatric surgery, one year ago, lost about 85 pounds before and after surgery. Also has a history of hypertension and sleep apnea. He has been admitted twice in the last month with an unusual presentation with multiple medical problems, including pulmonary embolus, moderate to large pericardial effusion, new onset a-fib and GI bleed. His PE was diagnosed by his PCP through outpatient w/u and he was placed on Xarelto. His first hospital admission was from 4/15-4/24. At that time his CC was chest pain. W/u suggested a diagnosis of pericarditis. An Echocardiogram was done, which showed moderate to large pericardial effusion. Meanwhile the night of 4/16 he went in to afib with RVR, probably secondary to PE and was transferred to step down and started on cardizem drip. He converted to sinus the same night , cardizem discontinued and he was resumed on metoprolol. CT surgery was consulted for pericardial window. He underwent pericardial window on 4/17 and hemorrhagic fluid was drained and was sent for analysis. Gram stain and cultures were negative. He was also started on colchicine for his pericarditis. The evening of 4/20 pt went back in to afib with RVR and had bloody bowel movement. His xarelto was discontinued and he was put on cardizem gtt. Over the next 24 hours his hemoglobin dropped to 7.8 and he received 2 units of prbc transfusion. His repeat H&H improved. Dr Marina Goodell was consulted from GI on 4/20 and he underwent endoscopy on 4/21 . He was found to have superficial ulcers in the stomach, underwent biopsy of the area. He was recommended to continue on PPI twice daily for 2 weeks and resume xarelto. He was discharged home then returned 3 days later on 07/28/13 with a complaint of increased fatigue and SOB. Repeat 2D echo demonstrated only minimal  residual effusion with normal RV and LV function. 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 He was admitted and underwent a repeat EGD which showed healing of ulcers, no bleed. He was continued on PO iron and a PPI. His Hgb at time of discharge was stable at 9.0. He was discharged home. Cardiology had recommended an OP stress test. This was done 08/03/13 and showed normal myocardial perfusion and normal LV EF of 58%.  He returns today and has reported a marked improvement in his shortness of breath. He is able to do most activities without any difficulty. He continues on Eliquis for his pulmonary embolus and for stroke prevention. He is on flecainide for antiarrhythmic therapy. He denies any chest pain. His only complaint is fatigue. He reports some fatigue and notices it worse as he has been working on refurbishing a house that his mother used to own to sell. He reports his sleep is good at night however he does have difficulty falling asleep which may be playing a role in his fatigue.  Mr. Brutus returns today for follow-up. He reports no significant difference in his symptoms. He gets some mild shortness of breath with exercise which she does very infrequently. He also reports leg swelling which is persistent. Weight has been fairly stable although could stand to go down. We talked about ways he could work on weight loss. He does have a history of lap band in the past however he is not been able to lose anymore weight and in fact has  gained it back. He denies any recurrent palpitation or A. Fib that he is aware of. He continues on flecainide which he is tolerating without any difficulty.  04/28/2016  Mr. Kantor was seen back today in follow-up. He continues to complain of shortness of breath, persistent with exertion. Weight is now up from 337-350 pounds. He is celebrating the birth of this newest grandchild and wants to be more active and is frustrated that he is not able to do much activity  without shortness of breath. He is on flecainide without any recurrent atrial fibrillation. His last stress test was more than 2 years ago and he is due for repeat stress test.  05/23/2016  Mr. Petion returns today for follow-up. He is unfortunately gained another few pounds although he felt that this is related to stuff in his pockets. His stress test was negative for ischemia. It is noted that his blood pressure is high again today 168/89. He was previously elevated over 150. He does have a history of hypertension and was on Lotrel in the past but was taken off of that due to hypotension after significant weight loss that occurred after lap band surgery. Unfortunately he has gained back most of that weight and likely is hypertensive secondary to that.  11/22/2016  Mr. Dorer was seen today in follow-up. He continues to have some progressive shortness of breath with exertion. 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 I wonder if this is playing a role in upper airway obstruction which could be causing his shortness of breath. Blood pressure appears to be better controlled today although he was taken off of blood pressure medications when he had a stroke. He was also previously on a number of inhalers including Symbicort and Breo Ellipta - these have also been discontinued.  02/20/2017  Mr. Neyra returns today for follow-up.  I referred him to pulmonary for evaluation of his dyspnea.  He had no improvement with inhalers and underwent repeat pulmonary function testing by Dr. Kendrick Fries, which failed to reveal any significant obstructive pulmonary disease.  It was felt that he might be volume overloaded and he was switched from Lasix to torsemide.  It appears the dose was equivalent.  He is not diuresed anymore and says that he is not more short of breath however we will need to recheck his metabolic profile to ensure  he is not being over diuresed.  With regards to vocal cord dysfunction, he has follow-up at [facility] for evaluation of possible surgery.  It is not clear whether this will improve his shortness of breath not and I advised him to ask his surgeon whether the surgery would help more with his breathing or phonation.  01/09/2018  Mr. Courington is seen today in follow-up.  Overall he continues to have shortness of breath.  Weight is still significant issue.  He is essentially gained back all the weight that he lost after weight loss surgery.  He was recently seen by Theodore Demark, PA-C.  Who noted that his weighted maintained a stable level on torsemide 60 mg daily.  This was previously increased by his PCP.  He continues to keep off the edema but has had no significant change in his shortness of breath.  He is been working with Dr. Wynn Banker in [facility], who has been giving him pain injections in his back.  Recently this is been helpful for him.  12/04/2019  Mr. Gauld is seen today in follow-up.  He has persistent shortness of breath which worsened significantly recently when he was off of his torsemide for several days.  He then restarted it and ultimately got back to his baseline.  Weight is up about 8 pounds and has had continued issue losing it.  He is concerned about the cost of Eliquis and asked for some samples today.  He is taking the medicine.  He is not aware of any A. fib.  EKG does not show A. fib today.  He does have a history of LAP-BAND procedure but did not follow-up with general surgery after that.  06/10/2020  Mr. Kampa returns today for follow-up.  Weight continues to be an issue and is not significantly lower.  He is now 390 pounds.  He is having dyspnea with minimal exertion.  He denies any recurrent A. fib.  He is on the flecainide.  He also takes Eliquis.  He says he is going to try to lose weight with dietary approaches.  I did offer the comprehensive weight management clinic but he  declined at this time.  04/21/2021  Mr. Millikan  is seen today for follow-up.  He continues to endorse dyspnea.  He is working with pulmonary on this.  Primarily he notes shortness of breath associated with hypoxemia with minimal exertion.  He says his oxygen saturations dropped down into the 70% range on oxygen with minimal exertion.  He had a repeat echo April 12, 2021 which showed normal LVEF 60 to 65% and moderate pulmonary hypertension with RVSP 48.4 mmHg.  The aorta was mildly dilated at 40 mm.  No signs of elevated left heart pressure.  The IVC was considered normal.  He was seen by pulmonary in December on December 20 and had a BNP which was 71 (normal) and improved from previous BMPs in the past.  Based on that I agree he appears to be euvolemic but continues to have issues with exercise-induced desaturation.  We discussed about possible causes of this and I am concerned that exercise-induced pulmonary hypertension is a big possibility or possibly shunting.  08/22/2022  Mr. Zerby returns today for follow-up.  He reports continued issues with dyspnea.  Underwent left and right heart catheterization by Dr. 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  Recommended management was SGLT2 inhibitors and diuretics.  Despite this he is still having symptoms of worsening shortness of breath with exertion.  He reminded me that he did have a pericardial window performed for pericarditis and significant pericardial effusion back in 2015.  Report at the time from Dr. Tyrone Sage showed a pericardial biopsy which was negative for tumor and culture negative but was bloody with significant pericarditis suggesting possible viral etiology.  Echo in January 2023 showed normal LVEF and no pericardial effusion.  Of note during right heart catheterization his RV diastolic, PA diastolic and PCWP were all around 18 mmHg, suggesting diastolic equalization of pressures. Simultaneous  waveforms were not recorded, but I reviewed them.  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  PMHx:  Past Medical History:  Diagnosis Date   Anemia 07/29/2013   Asthma    childhood asthma   Benign essential HTN    Benign prostatic hypertrophy    several prostate biopsies neg for cancer.    Bilateral hip pain    Cancer (HCC)    CHF (congestive heart failure) (HCC)    Chronic anticoagulation 07/22/2013   Complication of anesthesia  MORPHINE CAUSES HALLUCINATIONS   CVA (cerebral vascular accident) (HCC) 06/2016   Depression    Diastolic congestive heart failure (HCC) 07/16/2013   Dysphagia, post-stroke    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   FH: colonic polyps 2010   Gout    History of kidney stones    Hypertension    Kidney cysts    Morbid obesity (HCC)    Numbness    feet / legs   PAF (paroxysmal atrial fibrillation) with RVR 07/29/2013   Pericarditis 2015   s/p window   Recurrent pulmonary emboli (HCC) 06/13/2015   Sleep apnea    USES C PAP   Stroke syndrome    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    Past Surgical History:  Procedure Laterality Date   ARTHROSCOPY KNEE W/ DRILLING     BREATH TEK H PYLORI N/A 12/23/2012   Procedure: BREATH TEK H PYLORI;  Surgeon: Atilano Ina, MD;  Location: [facility] END[facility]COPY;  Service: General;  Laterality: N/A;   CHOLECYSTECTOMY  1989   COLONOSCOPY N/A 07/31/2013   Procedure: COLONOSCOPY;  Surgeon: Iva Boop, MD;  Location: [facility] ENDOSCOPY;  Service: Endoscopy;  Laterality: N/A;   ESOPHAGOGASTRODUODENOSCOPY N/A 07/22/2013   Procedure: ESOPHAGOGASTRODUODENOSCOPY (EGD);  Surgeon: Hilarie Fredrickson, MD;  Location: [facility] ENDOSCOPY;  Service: Endoscopy;  Laterality: N/A;   ESOPHAGOGASTRODUODENOSCOPY N/A 07/31/2013   Procedure: ESOPHAGOGASTRODUODENOSCOPY (EGD);  Surgeon: Iva Boop, MD;  Location: [facility] ENDOSCOPY;  Service: Endoscopy;  Laterality: N/A;   INTRAOPERATIVE TRANSESOPHAGEAL ECHOCARDIOGRAM N/A 07/18/2013   Procedure:  INTRAOPERATIVE TRANSESOPHAGEAL ECHOCARDIOGRAM;  Surgeon: Delight Ovens, MD;  Location: [facility] OR;  Service: Open Heart Surgery;  Laterality: N/A;   LAPAROSCOPIC GASTRIC BANDING WITH HIATAL HERNIA REPAIR N/A 04/08/2013   Procedure: LAPAROSCOPIC GASTRIC BANDING WITH possible  HIATAL HERNIA REPAIR;  Surgeon: Atilano Ina, MD;  Location: [facility] ORS;  Service: General;  Laterality: N/A;   LITHOTRIPSY     renal calculi     RIGHT AND LEFT HEART CATH N/A 05/12/2021   Procedure: RIGHT AND LEFT HEART CATH;  Surgeon: Dolores Patty, MD;  Location: [facility] INVASIVE CV LAB;  Service: Cardiovascular;  Laterality: N/A;   SUBXYPHOID PERICARDIAL WINDOW N/A 07/18/2013   Procedure: SUBXYPHOID PERICARDIAL WINDOW;  Surgeon: Delight Ovens, MD;  Location: [facility] OR;  Service: Thoracic;  Laterality: N/A;   TONSILLECTOMY     total knee replacment     bilat   trigger finger repair     also lue, cts surgically repaired   WISDOM TOOTH EXTRACTION      FAMHx:  Family History  Problem Relation Age of Onset   Depression Mother    Hypertension Mother    Dementia Mother    Heart disease Father        MI   Depression Brother    Stroke Paternal Aunt        CVA   Diabetes Paternal Uncle    Heart disease Paternal Uncle        MI   Heart disease Paternal Grandmother        MI   Diabetes Cousin        PATERNAL    SOCHx:   reports that he quit smoking about 48 years ago. His smoking use included cigarettes. He started smoking about 61 years ago. He has a 2.60 pack-year smoking history. He has never used smokeless tobacco. He reports that he does not drink alcohol and does not use drugs.  ALLERGIES:  Allergies  Allergen Reactions  Morphine Nausea And Vomiting and Other (See Comments)    Severe hallucinations Other reaction(s): Hallucinations   Penicillins Other (See Comments)    Passed out after injection Did it involve swelling of the face/tongue/throat, SOB, or low BP? Unknown Did it involve sudden or severe  rash/hives, skin peeling, or any reaction on the inside of your mouth or nose? No Did you need to seek medical attention at a hospital or doctor's office? Yes When did it last happen?    teenager   If all above answers are "NO", may proceed with cephalosporin use. Other reaction(s): Dizziness, Unknown   Codeine Nausea Only   Propoxyphene N-Acetaminophen Nausea Only    ROS: Pertinent items noted in HPI and remainder of comprehensive ROS otherwise negative.  HOME MEDS: Current Outpatient Medications  Medication Sig Dispense Refill   atorvastatin (LIPITOR) 80 MG tablet TAKE 1 TABLET(80 MG) BY MOUTH DAILY 90 tablet 1   carboxymethylcellulose (REFRESH PLUS) 0.5 % SOLN Place 1 drop into both eyes 3 (three) times daily as needed (dry eyes).     [employer] MELATONIN GUMMIES PO Take 5 mg by mouth at bedtime.     Cyanocobalamin 1000 MCG SUBL Take 1,000 mcg by mouth daily.     Docusate Sodium (DSS) 100 MG CAPS Take 100 mg by mouth every other day.     ELIQUIS 5 MG TABS tablet TAKE 1 TABLET(5 MG) BY MOUTH TWICE DAILY 180 tablet 0   finasteride (PROSCAR) 5 MG tablet Take 1 tablet (5 mg total) by mouth daily. 30 tablet 1   flecainide (TAMBOCOR) 50 MG tablet TAKE 1 TABLET(50 MG) BY MOUTH EVERY 12 HOURS 180 tablet 3   JARDIANCE 10 MG TABS tablet TAKE 1 TABLET(10 MG) BY MOUTH DAILY BEFORE BREAKFAST 90 tablet 1   loratadine (CLARITIN) 10 MG tablet Take 10 mg by mouth daily.     LORazepam (ATIVAN) 0.5 MG tablet Take 1 tablet (0.5 mg total) by mouth at bedtime. 30 tablet 2   losartan (COZAAR) 25 MG tablet Take 1 tablet (25 mg total) by mouth daily. 90 tablet 3   metoprolol succinate (TOPROL-XL) 25 MG 24 hr tablet TAKE 1 TABLET(25 MG) BY MOUTH DAILY 90 tablet 3   montelukast (SINGULAIR) 10 MG tablet TAKE 1 TABLET(10 MG) BY MOUTH AT BEDTIME 90 tablet 1   Multiple Vitamin (MULTIVITAMIN WITH MINERALS) TABS tablet Take 1 tablet by mouth daily.     pantoprazole (PROTONIX) 40 MG tablet TAKE 1 TABLET(40 MG) BY MOUTH  DAILY 90 tablet 1   potassium chloride SA (KLOR-CON M) 20 MEQ tablet Take 1 tablet (20 mEq total) by mouth 2 (two) times daily. 180 tablet 3   rOPINIRole (REQUIP) 0.5 MG tablet TAKE 1 TABLET(0.5 MG) BY MOUTH THREE TIMES DAILY 270 tablet 2   sertraline (ZOLOFT) 100 MG tablet Take 1.5 tablets (150 mg total) by mouth at bedtime. 135 tablet 0   torsemide (DEMADEX) 20 MG tablet TAKE 3 TABLETS BY MOUTH EVERY DAY 270 tablet 2   traMADol (ULTRAM) 50 MG tablet TAKE 1 TABLET BY MOUTH EVERY 8 HOURS AS NEEDED 90 tablet 5   zinc gluconate 50 MG tablet Take 1 tablet (50 mg total) by mouth daily. 90 tablet 1   No current facility-administered medications for this visit.    LABS/IMAGING: No results found. However, due to the size of the patient record, not all encounters were searched. Please check Results Review for a complete set of results. No results found.  VITALS: BP 136/60  Pulse (!) 52   Ht 6' (1.829 m)   Wt (!) 367 lb (166.5 kg)   SpO2 96%   BMI 49.77 kg/m   EXAM: General appearance: alert, no distress, morbidly obese, and on oxygen Neck: no carotid bruit, no JVD, and thyroid not enlarged, symmetric, no tenderness/mass/nodules Lungs: diminished breath sounds bilaterally Heart: regular rate and rhythm Abdomen: morbidly obese Extremities: extremities normal, atraumatic, no cyanosis or edema Pulses: 2+ and symmetric Skin: pale, warm, diaphoretic Neurologic: Grossly normal Psych: Pleasant  EKG: Sinus bradycardia at 52, RBBB - personally reviewed  ASSESSMENT: Persistent dyspnea on exertion, with exercise-induced desaturations -cardiac catheterization suggested severe diastolic dysfunction with diastolic equalization of right and left heart pressures 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   Mr. Mertel is to have progressive dyspnea on exertion now with minimal activity.  He is on oxygen.  He has not responded to diuretic therapy.  Minimal activity seems to make him quite dyspneic.  He is maintaining sinus rhythm on flecainide.  He did have a history of pericarditis which was presumed viral in 2015 and underwent pericardial window for pericardial effusion.  He has not had any recurrent effusion however I wonder if he could have developed a constrictive pericarditis.  I did review his right and left heart cath findings again from February 2023, this showed mild pulmonary hypertension and high cardiac output with severe pulmonary hypertension marked elevation in wedge pressure with minimal activity.  No evidence of left to right shunting was noted.  This was consistent with severe diastolic dysfunction and pulmonary venous hypertension.  Despite management for this his symptoms continue to worsen.  Considering the possibility of a constrictive pericarditis although not seen on echocardiogram, would like to obtain a cardiac MRI to further evaluate his heart.  With his severe diastolic dysfunction also to rule out the possibility of ATTR amyloidosis.  Finally, his PCP had suggested that he undergo lower extremity venous insufficiency studies because of progressive swelling.  We will order those today.  Follow-up with me afterward.  Chrystie Nose, MD, [facility], FACP  [place]  [facility]  Medical Director of the [facility] Attending Cardiologist  Direct Dial: [phone]  Fax: [phone]  Website:  [email or web address] Vannary Greening 08/22/2022, 4:02 PM

## 2022-08-23 LAB — BASIC METABOLIC PANEL
BUN/Creatinine Ratio: 29 — ABNORMAL HIGH (ref 10–24)
BUN: 32 mg/dL — ABNORMAL HIGH (ref 8–27)
CO2: 30 mmol/L — ABNORMAL HIGH (ref 20–29)
Calcium: 8.9 mg/dL (ref 8.6–10.2)
Chloride: 103 mmol/L (ref 96–106)
Creatinine, Ser: 1.11 mg/dL (ref 0.76–1.27)
Glucose: 93 mg/dL (ref 70–99)
Potassium: 4.3 mmol/L (ref 3.5–5.2)
Sodium: 147 mmol/L — ABNORMAL HIGH (ref 134–144)
eGFR: 69 mL/min/{1.73_m2} (ref >59)

## 2022-08-23 LAB — BRAIN NATRIURETIC PEPTIDE: BNP: 197.4 pg/mL — ABNORMAL HIGH (ref 0.0–100.0)

## 2022-08-24 ENCOUNTER — Encounter: Payer: Self-pay | Admitting: Adult Health

## 2022-08-25 ENCOUNTER — Other Ambulatory Visit: Payer: Self-pay | Admitting: Internal Medicine

## 2022-08-26 ENCOUNTER — Other Ambulatory Visit: Payer: Self-pay | Admitting: Internal Medicine

## 2022-08-26 DIAGNOSIS — H1010 Acute atopic conjunctivitis, unspecified eye: Secondary | ICD-10-CM

## 2022-08-26 DIAGNOSIS — E785 Hyperlipidemia, unspecified: Secondary | ICD-10-CM

## 2022-08-26 DIAGNOSIS — I480 Paroxysmal atrial fibrillation: Secondary | ICD-10-CM

## 2022-08-26 DIAGNOSIS — K2100 Gastro-esophageal reflux disease with esophagitis, without bleeding: Secondary | ICD-10-CM

## 2022-08-29 NOTE — Telephone Encounter (Signed)
LE venous doppler - 6/6 Cardiac MRI - 8/8

## 2022-08-29 NOTE — Telephone Encounter (Signed)
Pt called in today stating he is no longer interested in the referral.

## 2022-08-30 NOTE — Addendum Note (Signed)
Addended by: Lindell Spar on: 08/30/2022 08:44 AM   Modules accepted: Orders

## 2022-09-05 ENCOUNTER — Ambulatory Visit (HOSPITAL_BASED_OUTPATIENT_CLINIC_OR_DEPARTMENT_OTHER): Admission: RE | Admit: 2022-09-05 | Payer: Medicare Other | Source: Ambulatory Visit

## 2022-09-07 ENCOUNTER — Ambulatory Visit (HOSPITAL_COMMUNITY)
Admission: RE | Admit: 2022-09-07 | Discharge: 2022-09-07 | Disposition: A | Discharge status: Home / Self Care | Payer: Medicare Other | Source: Ambulatory Visit | Attending: Vascular Surgery | Admitting: Vascular Surgery

## 2022-09-07 DIAGNOSIS — R600 Localized edema: Secondary | ICD-10-CM | POA: Insufficient documentation

## 2022-09-19 ENCOUNTER — Ambulatory Visit: Payer: Medicare Other | Admitting: Internal Medicine

## 2022-10-06 ENCOUNTER — Other Ambulatory Visit: Payer: Self-pay | Admitting: Physical Medicine & Rehabilitation

## 2022-10-10 ENCOUNTER — Encounter: Payer: Self-pay | Admitting: Internal Medicine

## 2022-10-10 ENCOUNTER — Ambulatory Visit (INDEPENDENT_AMBULATORY_CARE_PROVIDER_SITE_OTHER): Payer: Medicare Other | Admitting: Internal Medicine

## 2022-10-10 VITALS — BP 132/66 | HR 60 | Temp 98.7°F | Resp 16 | Ht 72.0 in | Wt 368.0 lb

## 2022-10-10 DIAGNOSIS — E118 Type 2 diabetes mellitus with unspecified complications: Secondary | ICD-10-CM

## 2022-10-10 DIAGNOSIS — E538 Deficiency of other specified B group vitamins: Secondary | ICD-10-CM

## 2022-10-10 DIAGNOSIS — Z7984 Long term (current) use of oral hypoglycemic drugs: Secondary | ICD-10-CM | POA: Diagnosis not present

## 2022-10-10 DIAGNOSIS — I10 Essential (primary) hypertension: Secondary | ICD-10-CM

## 2022-10-10 DIAGNOSIS — D538 Other specified nutritional anemias: Secondary | ICD-10-CM | POA: Diagnosis not present

## 2022-10-10 LAB — BASIC METABOLIC PANEL
BUN: 31 mg/dL — ABNORMAL HIGH (ref 6–23)
CO2: 38 mEq/L — ABNORMAL HIGH (ref 19–32)
Calcium: 8.9 mg/dL (ref 8.4–10.5)
Chloride: 100 mEq/L (ref 96–112)
Creatinine, Ser: 1.18 mg/dL (ref 0.40–1.50)
GFR: 59.82 mL/min — ABNORMAL LOW (ref >60.00)
Glucose, Bld: 101 mg/dL — ABNORMAL HIGH (ref 70–99)
Potassium: 4.3 mEq/L (ref 3.5–5.1)
Sodium: 145 mEq/L (ref 135–145)

## 2022-10-10 LAB — CBC WITH DIFFERENTIAL/PLATELET
Basophils Absolute: 0.1 10*3/uL (ref 0.0–0.1)
Basophils Relative: 0.8 % (ref 0.0–3.0)
Eosinophils Absolute: 0.1 10*3/uL (ref 0.0–0.7)
Eosinophils Relative: 1.2 % (ref 0.0–5.0)
HCT: 39.2 % (ref 39.0–52.0)
Hemoglobin: 12.5 g/dL — ABNORMAL LOW (ref 13.0–17.0)
Lymphocytes Relative: 10.3 % — ABNORMAL LOW (ref 12.0–46.0)
Lymphs Abs: 1 10*3/uL (ref 0.7–4.0)
MCHC: 31.8 g/dL (ref 30.0–36.0)
MCV: 93.5 fl (ref 78.0–100.0)
Monocytes Absolute: 1 10*3/uL (ref 0.1–1.0)
Monocytes Relative: 10.5 % (ref 3.0–12.0)
Neutro Abs: 7.4 10*3/uL (ref 1.4–7.7)
Neutrophils Relative %: 77.2 % — ABNORMAL HIGH (ref 43.0–77.0)
Platelets: 210 10*3/uL (ref 150.0–400.0)
RBC: 4.19 Mil/uL — ABNORMAL LOW (ref 4.22–5.81)
RDW: 14.3 % (ref 11.5–15.5)
WBC: 9.6 10*3/uL (ref 4.0–10.5)

## 2022-10-10 LAB — FOLATE: Folate: >23.8 ng/mL (ref >5.9)

## 2022-10-10 LAB — VITAMIN B12: Vitamin B-12: 1265 pg/mL — ABNORMAL HIGH (ref 211–911)

## 2022-10-10 LAB — HEMOGLOBIN A1C: Hgb A1c MFr Bld: 6.3 % (ref 4.6–6.5)

## 2022-10-10 MED ORDER — ZINC GLUCONATE 50 MG PO TABS: 50.0000 mg | ORAL_TABLET | Freq: Every day | ORAL | 1 refills | Status: DC

## 2022-10-10 NOTE — Progress Notes (Signed)
Subjective:  Patient ID: [name], male    DOB: [birth year]  Age: 78 y.o. MRN: [id number]  CC: Anemia, Hypertension, and Diabetes   HPI [name] presents for f/up ----  Discussed the use of AI scribe software for clinical note transcription with the patient, who gave verbal consent to proceed.  History of Present Illness   The patient presents with a chief complaint of a persistent sore on the posterior aspect of the right thigh, near the buttocks. This lesion has been present for approximately three weeks. The patient reports that the sore reopens with each instance of sitting down, suggesting a possible pressure sore due to prolonged sedentary behavior. The patient has recently changed seating arrangements, from an old chair to a new lazy boy, in an attempt to alleviate the issue. The sore is reported to be in the process of scabbing over.       Outpatient Medications Prior to Visit  Medication Sig Dispense Refill   atorvastatin (LIPITOR) 80 MG tablet TAKE 1 TABLET(80 MG) BY MOUTH DAILY 90 tablet 1   carboxymethylcellulose (REFRESH PLUS) 0.5 % SOLN Place 1 drop into both eyes 3 (three) times daily as needed (dry eyes).     [facility] MELATONIN GUMMIES PO Take 5 mg by mouth at bedtime.     Docusate Sodium (DSS) 100 MG CAPS Take 100 mg by mouth every other day.     ELIQUIS 5 MG TABS tablet TAKE 1 TABLET(5 MG) BY MOUTH TWICE DAILY 180 tablet 0   finasteride (PROSCAR) 5 MG tablet Take 1 tablet (5 mg total) by mouth daily. 30 tablet 1   flecainide (TAMBOCOR) 50 MG tablet TAKE 1 TABLET(50 MG) BY MOUTH EVERY 12 HOURS 180 tablet 3   JARDIANCE 10 MG TABS tablet TAKE 1 TABLET(10 MG) BY MOUTH DAILY BEFORE BREAKFAST 90 tablet 1   loratadine (CLARITIN) 10 MG tablet Take 10 mg by mouth daily.     LORazepam (ATIVAN) 0.5 MG tablet Take 1 tablet (0.5 mg total) by mouth at bedtime. 30 tablet 2   losartan (COZAAR) 25 MG tablet Take 1 tablet (25 mg total) by mouth daily. 90 tablet 3    metoprolol succinate (TOPROL-XL) 25 MG 24 hr tablet TAKE 1 TABLET(25 MG) BY MOUTH DAILY 90 tablet 3   montelukast (SINGULAIR) 10 MG tablet TAKE 1 TABLET(10 MG) BY MOUTH AT BEDTIME 90 tablet 1   Multiple Vitamin (MULTIVITAMIN WITH MINERALS) TABS tablet Take 1 tablet by mouth daily.     pantoprazole (PROTONIX) 40 MG tablet TAKE 1 TABLET(40 MG) BY MOUTH DAILY 90 tablet 1   potassium chloride SA (KLOR-CON M) 20 MEQ tablet Take 1 tablet (20 mEq total) by mouth 2 (two) times daily. 180 tablet 3   rOPINIRole (REQUIP) 0.5 MG tablet TAKE 1 TABLET(0.5 MG) BY MOUTH THREE TIMES DAILY 270 tablet 2   sertraline (ZOLOFT) 100 MG tablet Take 1.5 tablets (150 mg total) by mouth at bedtime. 135 tablet 0   torsemide (DEMADEX) 20 MG tablet TAKE 3 TABLETS BY MOUTH EVERY DAY 270 tablet 2   traMADol (ULTRAM) 50 MG tablet TAKE 1 TABLET BY MOUTH EVERY 8 HOURS AS NEEDED 90 tablet 5   Cyanocobalamin 1000 MCG SUBL Take 1,000 mcg by mouth daily.     zinc gluconate 50 MG tablet Take 1 tablet (50 mg total) by mouth daily. 90 tablet 1   No facility-administered medications prior to visit.    ROS Review of Systems  Constitutional:  Positive for fatigue.  Negative for appetite change, diaphoresis and unexpected weight change.  HENT: Negative.    Eyes: Negative.   Respiratory:  Positive for shortness of breath. Negative for choking, chest tightness and wheezing.   Cardiovascular:  Positive for leg swelling. Negative for chest pain and palpitations.  Gastrointestinal: Negative.  Negative for abdominal pain, diarrhea, nausea and vomiting.  Genitourinary: Negative.  Negative for difficulty urinating.  Musculoskeletal:  Positive for arthralgias and gait problem. Negative for myalgias.  Skin:  Positive for color change.  Neurological:  Negative for dizziness, weakness and light-headedness.  Hematological:  Negative for adenopathy. Does not bruise/bleed easily.  Psychiatric/Behavioral: Negative.      Objective:  BP 132/66 (BP  Location: Left Arm, Patient Position: Sitting, Cuff Size: Large)   Pulse 60   Temp 98.7 F (37.1 C) (Oral)   Resp 16   Ht 6' (1.829 m)   Wt (!) 368 lb (166.9 kg)   SpO2 90%   BMI 49.91 kg/m   BP Readings from Last 3 Encounters:  10/10/22 132/66  08/22/22 136/60  07/26/22 126/71    Wt Readings from Last 3 Encounters:  10/10/22 (!) 368 lb (166.9 kg)  08/22/22 (!) 367 lb (166.5 kg)  06/19/22 (!) 365 lb (165.6 kg)    Physical Exam Vitals reviewed.  Constitutional:      General: He is not in acute distress.    Appearance: He is obese. He is ill-appearing (continuous oxygen, riding a scooter). He is not toxic-appearing or diaphoretic.  HENT:     Mouth/Throat:     Mouth: Mucous membranes are moist.  Eyes:     General: No scleral icterus.    Conjunctiva/sclera: Conjunctivae normal.  Cardiovascular:     Rate and Rhythm: Normal rate and regular rhythm.     Heart sounds: No murmur heard. Pulmonary:     Effort: Pulmonary effort is normal.     Breath sounds: No stridor. No wheezing, rhonchi or rales.  Abdominal:     General: Abdomen is protuberant. There is no distension.     Palpations: There is no mass.     Tenderness: There is no abdominal tenderness. There is no guarding.     Hernia: No hernia is present.  Musculoskeletal:        General: Normal range of motion.     Cervical back: Neck supple.     Right lower leg: No edema.     Left lower leg: No edema.  Lymphadenopathy:     Cervical: No cervical adenopathy.  Skin:    General: Skin is warm.     Coloration: Skin is not pale.  Neurological:     Mental Status: He is alert. Mental status is at baseline.     Sensory: Sensory deficit present.     Motor: Weakness present.     Coordination: Coordination abnormal.     Gait: Gait abnormal.     Deep Tendon Reflexes: Reflexes abnormal.  Psychiatric:        Mood and Affect: Mood normal.        Behavior: Behavior normal.     Lab Results  Component Value Date   WBC 9.6  10/10/2022   HGB 12.5 (L) 10/10/2022   HCT 39.2 10/10/2022   PLT 210.0 10/10/2022   GLUCOSE 101 (H) 10/10/2022   CHOL 99 02/07/2022   TRIG 91.0 02/07/2022   HDL 28.30 (L) 02/07/2022   LDLDIRECT 91.0 05/16/2018   LDLCALC 52 02/07/2022   ALT 19 02/07/2022   AST 23 02/07/2022  NA 145 10/10/2022   K 4.3 10/10/2022   CL 100 10/10/2022   CREATININE 1.18 10/10/2022   BUN 31 (H) 10/10/2022   CO2 38 (H) 10/10/2022   TSH 3.03 02/07/2022   PSA 0.31 06/19/2022   INR 1.6 (H) 12/30/2020   HGBA1C 6.3 10/10/2022   MICROALBUR 0.7 06/19/2022    VAS [place] LOWER EXTREMITY VENOUS REFLUX  Result Date: 09/07/2022  Lower Venous Reflux Study Patient Name:  ATLEY PAYANT [facility]  Date of Exam:   09/07/2022 Medical Rec #: [id number]          Accession #:    [id number] Date of Birth: 1946/03/04          Patient Gender: M Patient Age:   55 years Exam Location:  [facility] Procedure:      VAS [place] LOWER EXTREMITY VENOUS REFLUX Referring Phys: Iantha Fallen [name] --------------------------------------------------------------------------------  Indications: Edema.  Risk Factors: Immobility obesity and CHF. Limitations: Body habitus. Performing Technologist: Lowell Guitar RVT, RDMS  Examination Guidelines: A complete evaluation includes B-mode imaging, spectral Doppler, color Doppler, and power Doppler as needed of all accessible portions of each vessel. Bilateral testing is considered an integral part of a complete examination. Limited examinations for reoccurring indications may be performed as noted. The reflux portion of the exam is performed with the patient in reverse Trendelenburg. Significant venous reflux is defined as >500 ms in the superficial venous system, and >1 second in the deep venous system.  Venous Reflux Times +--------------+---------+------+-----------+------------+---------------+ RIGHT         Reflux NoRefluxReflux TimeDiameter cmsComments                                Yes                                          +--------------+---------+------+-----------+------------+---------------+ CFV                     yes   >1 second                             +--------------+---------+------+-----------+------------+---------------+ FV prox       no                                                    +--------------+---------+------+-----------+------------+---------------+ FV mid        no                                    duplicated vein +--------------+---------+------+-----------+------------+---------------+ FV dist       no                                    duplicated vein +--------------+---------+------+-----------+------------+---------------+ Popliteal     no                                                    +--------------+---------+------+-----------+------------+---------------+  GSV at [facility]              yes    >500 ms      1.36                    +--------------+---------+------+-----------+------------+---------------+ GSV prox thighno                            0.84                    +--------------+---------+------+-----------+------------+---------------+ GSV mid thigh no                            0.80                    +--------------+---------+------+-----------+------------+---------------+ GSV dist thighno                            0.77                    +--------------+---------+------+-----------+------------+---------------+ GSV at knee   no                            0.69                    +--------------+---------+------+-----------+------------+---------------+ GSV prox calf no                            0.70                    +--------------+---------+------+-----------+------------+---------------+ SSV Pop Fossa no                            0.57                    +--------------+---------+------+-----------+------------+---------------+ SSV prox calf no                             0.49                    +--------------+---------+------+-----------+------------+---------------+ PFV                     yes  > 1 second                             +--------------+---------+------+-----------+------------+---------------+   Summary: Right: - No evidence of deep vein thrombosis seen in the right lower extremity, from the common femoral through the popliteal veins. - No evidence of superficial venous thrombosis in the right lower extremity.  - No evidence of superficial venous reflux seen in the right short saphenous vein.  - Venous reflux is noted in the right common femoral vein. - Venous reflux is noted in the right profunda femoral vein. - Venous reflux is noted in the right sapheno-femoral junction.  *See table(s) above for measurements and observations. Electronically signed by Waverly Ferrari MD on 09/07/2022 at 3:56:22 PM.    Final     Assessment & Plan:   B12 deficiency- B12 is too high. -     Vitamin B12; Future -  CBC with Differential/Platelet; Future -     Folate; Future  Benign essential HTN- BP is well controlled. -     Basic metabolic panel; Future  Anemia due to zinc deficiency -     CBC with Differential/Platelet; Future -     Zinc Gluconate; Take 1 tablet (50 mg total) by mouth daily.  Dispense: 90 tablet; Refill: 1  Type II diabetes mellitus with manifestations (HCC) - His blood sugar is well controlled. -     Basic metabolic panel; Future -     Hemoglobin A1c; Future     Follow-up: Return in about 6 months (around 04/12/2023).  Sanda Linger, MD

## 2022-10-10 NOTE — Patient Instructions (Signed)

## 2022-10-26 ENCOUNTER — Telehealth: Payer: Self-pay | Admitting: Internal Medicine

## 2022-10-26 NOTE — Telephone Encounter (Signed)
Samples ready for pick up at the front desk.   Pt aware.

## 2022-10-26 NOTE — Telephone Encounter (Signed)
Patient's wife picked up medication.

## 2022-10-26 NOTE — Telephone Encounter (Signed)
Patient called and said his wife picked up his JARDIANCE 10 MG TABS tablet , but they can't find where they put it. He would like to know if he can get a sample of it while they're looking for it. He said he has been out of it for a week. Patient would like a call back at [phone].

## 2022-10-26 NOTE — Telephone Encounter (Signed)
Noted../lmb 

## 2022-11-03 ENCOUNTER — Other Ambulatory Visit: Payer: Self-pay | Admitting: Nurse Practitioner

## 2022-11-03 DIAGNOSIS — E118 Type 2 diabetes mellitus with unspecified complications: Secondary | ICD-10-CM

## 2022-11-03 DIAGNOSIS — I5032 Chronic diastolic (congestive) heart failure: Secondary | ICD-10-CM

## 2022-11-07 ENCOUNTER — Telehealth (HOSPITAL_COMMUNITY): Payer: Medicare Other | Admitting: Psychiatry

## 2022-11-07 ENCOUNTER — Encounter (HOSPITAL_COMMUNITY): Payer: Self-pay

## 2022-11-09 ENCOUNTER — Ambulatory Visit (HOSPITAL_COMMUNITY): Admission: RE | Admit: 2022-11-09 | Payer: Medicare Other | Source: Ambulatory Visit

## 2022-11-18 ENCOUNTER — Other Ambulatory Visit: Payer: Self-pay | Admitting: Internal Medicine

## 2022-11-24 DIAGNOSIS — Z1331 Encounter for screening for depression: Secondary | ICD-10-CM | POA: Diagnosis not present

## 2022-11-24 DIAGNOSIS — Z1389 Encounter for screening for other disorder: Secondary | ICD-10-CM | POA: Diagnosis not present

## 2022-11-24 DIAGNOSIS — Z7689 Persons encountering health services in other specified circumstances: Secondary | ICD-10-CM | POA: Diagnosis not present

## 2022-11-27 ENCOUNTER — Telehealth (HOSPITAL_COMMUNITY): Payer: Self-pay

## 2022-11-27 DIAGNOSIS — F330 Major depressive disorder, recurrent, mild: Secondary | ICD-10-CM

## 2022-11-27 DIAGNOSIS — F419 Anxiety disorder, unspecified: Secondary | ICD-10-CM

## 2022-11-27 MED ORDER — LORAZEPAM 0.5 MG PO TABS: 0.5000 mg | ORAL_TABLET | Freq: Every day | ORAL | 0 refills | Status: DC

## 2022-11-27 NOTE — Telephone Encounter (Signed)
Done

## 2022-11-27 NOTE — Telephone Encounter (Signed)
Pharmacy request for patients Lorazepam 0.5 mg last prescribed on 5/6 for #30 with 2 refills. Patient has a follow up with you on 9/8. Please review and advise, thank you

## 2022-12-02 ENCOUNTER — Other Ambulatory Visit: Payer: Self-pay

## 2022-12-02 ENCOUNTER — Emergency Department (HOSPITAL_COMMUNITY): Payer: Medicare Other

## 2022-12-02 ENCOUNTER — Inpatient Hospital Stay (HOSPITAL_COMMUNITY)
Admission: EM | Admit: 2022-12-02 | Discharge: 2022-12-08 | DRG: 871 | Disposition: A | Discharge status: Skilled Nursing Facility | Payer: Medicare Other | Attending: Internal Medicine | Admitting: Internal Medicine

## 2022-12-02 ENCOUNTER — Encounter (HOSPITAL_COMMUNITY): Payer: Self-pay

## 2022-12-02 DIAGNOSIS — I69391 Dysphagia following cerebral infarction: Secondary | ICD-10-CM

## 2022-12-02 DIAGNOSIS — Y92091 Bathroom in other non-institutional residence as the place of occurrence of the external cause: Secondary | ICD-10-CM | POA: Diagnosis not present

## 2022-12-02 DIAGNOSIS — N400 Enlarged prostate without lower urinary tract symptoms: Secondary | ICD-10-CM | POA: Diagnosis not present

## 2022-12-02 DIAGNOSIS — G2581 Restless legs syndrome: Secondary | ICD-10-CM | POA: Diagnosis not present

## 2022-12-02 DIAGNOSIS — R6521 Severe sepsis with septic shock: Secondary | ICD-10-CM | POA: Diagnosis present

## 2022-12-02 DIAGNOSIS — E876 Hypokalemia: Secondary | ICD-10-CM | POA: Diagnosis present

## 2022-12-02 DIAGNOSIS — S3992XA Unspecified injury of lower back, initial encounter: Secondary | ICD-10-CM | POA: Diagnosis not present

## 2022-12-02 DIAGNOSIS — R531 Weakness: Secondary | ICD-10-CM

## 2022-12-02 DIAGNOSIS — M5116 Intervertebral disc disorders with radiculopathy, lumbar region: Secondary | ICD-10-CM | POA: Diagnosis not present

## 2022-12-02 DIAGNOSIS — Z87448 Personal history of other diseases of urinary system: Secondary | ICD-10-CM

## 2022-12-02 DIAGNOSIS — I700 Atherosclerosis of aorta: Secondary | ICD-10-CM | POA: Diagnosis present

## 2022-12-02 DIAGNOSIS — Z8249 Family history of ischemic heart disease and other diseases of the circulatory system: Secondary | ICD-10-CM

## 2022-12-02 DIAGNOSIS — Z823 Family history of stroke: Secondary | ICD-10-CM

## 2022-12-02 DIAGNOSIS — F32A Depression, unspecified: Secondary | ICD-10-CM | POA: Diagnosis present

## 2022-12-02 DIAGNOSIS — Y9301 Activity, walking, marching and hiking: Secondary | ICD-10-CM | POA: Diagnosis present

## 2022-12-02 DIAGNOSIS — J45909 Unspecified asthma, uncomplicated: Secondary | ICD-10-CM | POA: Diagnosis not present

## 2022-12-02 DIAGNOSIS — Z885 Allergy status to narcotic agent status: Secondary | ICD-10-CM

## 2022-12-02 DIAGNOSIS — R279 Unspecified lack of coordination: Secondary | ICD-10-CM | POA: Diagnosis present

## 2022-12-02 DIAGNOSIS — S80919A Unspecified superficial injury of unspecified knee, initial encounter: Secondary | ICD-10-CM | POA: Diagnosis not present

## 2022-12-02 DIAGNOSIS — W19XXXA Unspecified fall, initial encounter: Secondary | ICD-10-CM | POA: Diagnosis not present

## 2022-12-02 DIAGNOSIS — E872 Acidosis, unspecified: Secondary | ICD-10-CM | POA: Diagnosis present

## 2022-12-02 DIAGNOSIS — Z86711 Personal history of pulmonary embolism: Secondary | ICD-10-CM

## 2022-12-02 DIAGNOSIS — E785 Hyperlipidemia, unspecified: Secondary | ICD-10-CM | POA: Diagnosis present

## 2022-12-02 DIAGNOSIS — R6520 Severe sepsis without septic shock: Secondary | ICD-10-CM

## 2022-12-02 DIAGNOSIS — Z886 Allergy status to analgesic agent status: Secondary | ICD-10-CM

## 2022-12-02 DIAGNOSIS — S3993XA Unspecified injury of pelvis, initial encounter: Secondary | ICD-10-CM | POA: Diagnosis not present

## 2022-12-02 DIAGNOSIS — Z9049 Acquired absence of other specified parts of digestive tract: Secondary | ICD-10-CM

## 2022-12-02 DIAGNOSIS — M109 Gout, unspecified: Secondary | ICD-10-CM | POA: Diagnosis present

## 2022-12-02 DIAGNOSIS — G4700 Insomnia, unspecified: Secondary | ICD-10-CM | POA: Diagnosis present

## 2022-12-02 DIAGNOSIS — I890 Lymphedema, not elsewhere classified: Secondary | ICD-10-CM | POA: Diagnosis present

## 2022-12-02 DIAGNOSIS — R9082 White matter disease, unspecified: Secondary | ICD-10-CM | POA: Diagnosis not present

## 2022-12-02 DIAGNOSIS — I110 Hypertensive heart disease with heart failure: Secondary | ICD-10-CM | POA: Diagnosis present

## 2022-12-02 DIAGNOSIS — W1830XA Fall on same level, unspecified, initial encounter: Secondary | ICD-10-CM | POA: Diagnosis present

## 2022-12-02 DIAGNOSIS — A419 Sepsis, unspecified organism: Principal | ICD-10-CM | POA: Diagnosis present

## 2022-12-02 DIAGNOSIS — Z7401 Bed confinement status: Secondary | ICD-10-CM | POA: Diagnosis not present

## 2022-12-02 DIAGNOSIS — R051 Acute cough: Secondary | ICD-10-CM

## 2022-12-02 DIAGNOSIS — J9611 Chronic respiratory failure with hypoxia: Secondary | ICD-10-CM | POA: Diagnosis present

## 2022-12-02 DIAGNOSIS — I2510 Atherosclerotic heart disease of native coronary artery without angina pectoris: Secondary | ICD-10-CM | POA: Diagnosis not present

## 2022-12-02 DIAGNOSIS — Z87442 Personal history of urinary calculi: Secondary | ICD-10-CM

## 2022-12-02 DIAGNOSIS — L89326 Pressure-induced deep tissue damage of left buttock: Secondary | ICD-10-CM | POA: Diagnosis present

## 2022-12-02 DIAGNOSIS — L03116 Cellulitis of left lower limb: Secondary | ICD-10-CM | POA: Diagnosis not present

## 2022-12-02 DIAGNOSIS — E669 Obesity, unspecified: Secondary | ICD-10-CM | POA: Diagnosis not present

## 2022-12-02 DIAGNOSIS — Z820 Family history of epilepsy and other diseases of the nervous system: Secondary | ICD-10-CM

## 2022-12-02 DIAGNOSIS — R9089 Other abnormal findings on diagnostic imaging of central nervous system: Secondary | ICD-10-CM | POA: Diagnosis not present

## 2022-12-02 DIAGNOSIS — N179 Acute kidney failure, unspecified: Secondary | ICD-10-CM | POA: Diagnosis not present

## 2022-12-02 DIAGNOSIS — Z83719 Family history of colon polyps, unspecified: Secondary | ICD-10-CM

## 2022-12-02 DIAGNOSIS — K219 Gastro-esophageal reflux disease without esophagitis: Secondary | ICD-10-CM | POA: Diagnosis present

## 2022-12-02 DIAGNOSIS — M79602 Pain in left arm: Secondary | ICD-10-CM | POA: Diagnosis not present

## 2022-12-02 DIAGNOSIS — R5381 Other malaise: Secondary | ICD-10-CM | POA: Diagnosis not present

## 2022-12-02 DIAGNOSIS — I5032 Chronic diastolic (congestive) heart failure: Secondary | ICD-10-CM | POA: Diagnosis present

## 2022-12-02 DIAGNOSIS — I480 Paroxysmal atrial fibrillation: Secondary | ICD-10-CM | POA: Diagnosis present

## 2022-12-02 DIAGNOSIS — I509 Heart failure, unspecified: Secondary | ICD-10-CM

## 2022-12-02 DIAGNOSIS — J3801 Paralysis of vocal cords and larynx, unilateral: Secondary | ICD-10-CM | POA: Diagnosis present

## 2022-12-02 DIAGNOSIS — Z818 Family history of other mental and behavioral disorders: Secondary | ICD-10-CM

## 2022-12-02 DIAGNOSIS — I5031 Acute diastolic (congestive) heart failure: Secondary | ICD-10-CM | POA: Diagnosis not present

## 2022-12-02 DIAGNOSIS — Z9089 Acquired absence of other organs: Secondary | ICD-10-CM

## 2022-12-02 DIAGNOSIS — F419 Anxiety disorder, unspecified: Secondary | ICD-10-CM | POA: Diagnosis present

## 2022-12-02 DIAGNOSIS — R748 Abnormal levels of other serum enzymes: Secondary | ICD-10-CM | POA: Diagnosis present

## 2022-12-02 DIAGNOSIS — G4733 Obstructive sleep apnea (adult) (pediatric): Secondary | ICD-10-CM | POA: Diagnosis present

## 2022-12-02 DIAGNOSIS — I77810 Thoracic aortic ectasia: Secondary | ICD-10-CM | POA: Diagnosis present

## 2022-12-02 DIAGNOSIS — Z87891 Personal history of nicotine dependence: Secondary | ICD-10-CM

## 2022-12-02 DIAGNOSIS — Z1152 Encounter for screening for COVID-19: Secondary | ICD-10-CM | POA: Diagnosis not present

## 2022-12-02 DIAGNOSIS — Z862 Personal history of diseases of the blood and blood-forming organs and certain disorders involving the immune mechanism: Secondary | ICD-10-CM

## 2022-12-02 DIAGNOSIS — R059 Cough, unspecified: Secondary | ICD-10-CM | POA: Diagnosis not present

## 2022-12-02 DIAGNOSIS — I5033 Acute on chronic diastolic (congestive) heart failure: Secondary | ICD-10-CM | POA: Diagnosis present

## 2022-12-02 DIAGNOSIS — R41841 Cognitive communication deficit: Secondary | ICD-10-CM | POA: Diagnosis present

## 2022-12-02 DIAGNOSIS — I517 Cardiomegaly: Secondary | ICD-10-CM | POA: Diagnosis not present

## 2022-12-02 DIAGNOSIS — I2729 Other secondary pulmonary hypertension: Secondary | ICD-10-CM | POA: Diagnosis present

## 2022-12-02 DIAGNOSIS — R918 Other nonspecific abnormal finding of lung field: Secondary | ICD-10-CM | POA: Diagnosis not present

## 2022-12-02 DIAGNOSIS — Z9981 Dependence on supplemental oxygen: Secondary | ICD-10-CM

## 2022-12-02 DIAGNOSIS — M5440 Lumbago with sciatica, unspecified side: Secondary | ICD-10-CM | POA: Diagnosis present

## 2022-12-02 DIAGNOSIS — L03115 Cellulitis of right lower limb: Secondary | ICD-10-CM | POA: Diagnosis present

## 2022-12-02 DIAGNOSIS — Z9889 Other specified postprocedural states: Secondary | ICD-10-CM

## 2022-12-02 DIAGNOSIS — M549 Dorsalgia, unspecified: Secondary | ICD-10-CM | POA: Diagnosis not present

## 2022-12-02 DIAGNOSIS — R001 Bradycardia, unspecified: Secondary | ICD-10-CM | POA: Diagnosis not present

## 2022-12-02 DIAGNOSIS — M47817 Spondylosis without myelopathy or radiculopathy, lumbosacral region: Secondary | ICD-10-CM | POA: Diagnosis present

## 2022-12-02 DIAGNOSIS — Z833 Family history of diabetes mellitus: Secondary | ICD-10-CM

## 2022-12-02 DIAGNOSIS — R9389 Abnormal findings on diagnostic imaging of other specified body structures: Secondary | ICD-10-CM | POA: Diagnosis not present

## 2022-12-02 DIAGNOSIS — Z880 Allergy status to penicillin: Secondary | ICD-10-CM

## 2022-12-02 DIAGNOSIS — R0989 Other specified symptoms and signs involving the circulatory and respiratory systems: Secondary | ICD-10-CM | POA: Diagnosis not present

## 2022-12-02 DIAGNOSIS — Z7984 Long term (current) use of oral hypoglycemic drugs: Secondary | ICD-10-CM

## 2022-12-02 DIAGNOSIS — R0602 Shortness of breath: Secondary | ICD-10-CM | POA: Diagnosis not present

## 2022-12-02 DIAGNOSIS — Z79899 Other long term (current) drug therapy: Secondary | ICD-10-CM

## 2022-12-02 DIAGNOSIS — R062 Wheezing: Secondary | ICD-10-CM | POA: Diagnosis not present

## 2022-12-02 DIAGNOSIS — Z6841 Body Mass Index (BMI) 40.0 and over, adult: Secondary | ICD-10-CM | POA: Diagnosis not present

## 2022-12-02 DIAGNOSIS — M6281 Muscle weakness (generalized): Secondary | ICD-10-CM | POA: Diagnosis present

## 2022-12-02 DIAGNOSIS — F330 Major depressive disorder, recurrent, mild: Secondary | ICD-10-CM

## 2022-12-02 DIAGNOSIS — E118 Type 2 diabetes mellitus with unspecified complications: Secondary | ICD-10-CM | POA: Diagnosis present

## 2022-12-02 DIAGNOSIS — Z7901 Long term (current) use of anticoagulants: Secondary | ICD-10-CM

## 2022-12-02 DIAGNOSIS — I10 Essential (primary) hypertension: Secondary | ICD-10-CM | POA: Diagnosis present

## 2022-12-02 DIAGNOSIS — G319 Degenerative disease of nervous system, unspecified: Secondary | ICD-10-CM | POA: Diagnosis not present

## 2022-12-02 DIAGNOSIS — R2689 Other abnormalities of gait and mobility: Secondary | ICD-10-CM | POA: Diagnosis present

## 2022-12-02 DIAGNOSIS — M79601 Pain in right arm: Secondary | ICD-10-CM | POA: Diagnosis not present

## 2022-12-02 DIAGNOSIS — Z96653 Presence of artificial knee joint, bilateral: Secondary | ICD-10-CM | POA: Diagnosis present

## 2022-12-02 LAB — I-STAT CG4 LACTIC ACID, ED
Lactic Acid, Venous: 2.6 mmol/L (ref 0.5–1.9)
Lactic Acid, Venous: 1.5 mmol/L (ref 0.5–1.9)

## 2022-12-02 LAB — CBC
HCT: 39.1 % (ref 39.0–52.0)
Hemoglobin: 11.9 g/dL — ABNORMAL LOW (ref 13.0–17.0)
MCH: 30.4 pg (ref 26.0–34.0)
MCHC: 30.4 g/dL (ref 30.0–36.0)
MCV: 100 fL (ref 80.0–100.0)
Platelets: 177 10*3/uL (ref 150–400)
RBC: 3.91 MIL/uL — ABNORMAL LOW (ref 4.22–5.81)
RDW: 14 % (ref 11.5–15.5)
WBC: 22 10*3/uL — ABNORMAL HIGH (ref 4.0–10.5)
nRBC: 0 % (ref 0.0–0.2)

## 2022-12-02 LAB — BASIC METABOLIC PANEL
Anion gap: 13 (ref 5–15)
BUN: 30 mg/dL — ABNORMAL HIGH (ref 8–23)
CO2: 31 mmol/L (ref 22–32)
Calcium: 8.1 mg/dL — ABNORMAL LOW (ref 8.9–10.3)
Chloride: 95 mmol/L — ABNORMAL LOW (ref 98–111)
Creatinine, Ser: 1.48 mg/dL — ABNORMAL HIGH (ref 0.61–1.24)
GFR, Estimated: 49 mL/min — ABNORMAL LOW (ref >60)
Glucose, Bld: 112 mg/dL — ABNORMAL HIGH (ref 70–99)
Potassium: 3.4 mmol/L — ABNORMAL LOW (ref 3.5–5.1)
Sodium: 139 mmol/L (ref 135–145)

## 2022-12-02 LAB — URINALYSIS, ROUTINE W REFLEX MICROSCOPIC
Bilirubin Urine: NEGATIVE
Glucose, UA: >=500 mg/dL — AB
Hgb urine dipstick: NEGATIVE
Ketones, ur: NEGATIVE mg/dL
Leukocytes,Ua: NEGATIVE
Nitrite: NEGATIVE
Protein, ur: NEGATIVE mg/dL
Specific Gravity, Urine: 1.009 (ref 1.005–1.030)
pH: 5 (ref 5.0–8.0)

## 2022-12-02 LAB — MRSA NEXT GEN BY PCR, NASAL: MRSA by PCR Next Gen: NOT DETECTED

## 2022-12-02 LAB — PROTIME-INR
INR: 1.6 — ABNORMAL HIGH (ref 0.8–1.2)
Prothrombin Time: 19.4 seconds — ABNORMAL HIGH (ref 11.4–15.2)

## 2022-12-02 LAB — BRAIN NATRIURETIC PEPTIDE: B Natriuretic Peptide: 771.9 pg/mL — ABNORMAL HIGH (ref 0.0–100.0)

## 2022-12-02 LAB — SARS CORONAVIRUS 2 BY RT PCR: SARS Coronavirus 2 by RT PCR: NEGATIVE

## 2022-12-02 LAB — GLUCOSE, CAPILLARY: Glucose-Capillary: 101 mg/dL — ABNORMAL HIGH (ref 70–99)

## 2022-12-02 LAB — CBG MONITORING, ED: Glucose-Capillary: 91 mg/dL (ref 70–99)

## 2022-12-02 LAB — APTT: aPTT: 34 seconds (ref 24–36)

## 2022-12-02 LAB — CK: Total CK: 566 U/L — ABNORMAL HIGH (ref 49–397)

## 2022-12-02 MED ORDER — VANCOMYCIN HCL 2000 MG/400ML IV SOLN
2000.0000 mg | Freq: Once | INTRAVENOUS | Status: AC
  Administered 2022-12-02: 2000 mg via INTRAVENOUS
  Filled 2022-12-02: qty 400

## 2022-12-02 MED ORDER — APIXABAN 5 MG PO TABS
5.0000 mg | ORAL_TABLET | Freq: Two times a day (BID) | ORAL | Status: DC
  Administered 2022-12-02 – 2022-12-08 (×13): 5 mg via ORAL
  Filled 2022-12-02 (×14): qty 1

## 2022-12-02 MED ORDER — IPRATROPIUM-ALBUTEROL 0.5-2.5 (3) MG/3ML IN SOLN
3.0000 mL | RESPIRATORY_TRACT | Status: DC | PRN
  Administered 2022-12-02 – 2022-12-03 (×2): 3 mL via RESPIRATORY_TRACT
  Filled 2022-12-02: qty 3

## 2022-12-02 MED ORDER — ATORVASTATIN CALCIUM 80 MG PO TABS
80.0000 mg | ORAL_TABLET | Freq: Every day | ORAL | Status: DC
  Administered 2022-12-02 – 2022-12-08 (×7): 80 mg via ORAL
  Filled 2022-12-02: qty 1
  Filled 2022-12-02: qty 2
  Filled 2022-12-02 (×5): qty 1

## 2022-12-02 MED ORDER — SODIUM CHLORIDE 0.9 % IV SOLN: 250.0000 mL | INTRAVENOUS | Status: DC

## 2022-12-02 MED ORDER — CHLORHEXIDINE GLUCONATE CLOTH 2 % EX PADS
6.0000 | MEDICATED_PAD | Freq: Every day | CUTANEOUS | Status: DC
  Administered 2022-12-02 – 2022-12-08 (×7): 6 via TOPICAL

## 2022-12-02 MED ORDER — LACTATED RINGERS IV BOLUS (SEPSIS)
500.0000 mL | Freq: Once | INTRAVENOUS | Status: AC
  Administered 2022-12-02: 500 mL via INTRAVENOUS

## 2022-12-02 MED ORDER — LACTATED RINGERS IV BOLUS
1000.0000 mL | Freq: Once | INTRAVENOUS | Status: AC
  Administered 2022-12-02: 1000 mL via INTRAVENOUS

## 2022-12-02 MED ORDER — DOCUSATE SODIUM 100 MG PO CAPS: 100.0000 mg | ORAL_CAPSULE | Freq: Two times a day (BID) | ORAL | Status: DC | PRN

## 2022-12-02 MED ORDER — LACTATED RINGERS IV SOLN: INTRAVENOUS | Status: DC

## 2022-12-02 MED ORDER — IBUPROFEN 800 MG PO TABS: 800.0000 mg | ORAL_TABLET | Freq: Once | ORAL | Status: DC

## 2022-12-02 MED ORDER — POLYETHYLENE GLYCOL 3350 17 G PO PACK: 17.0000 g | PACK | Freq: Every day | ORAL | Status: DC | PRN

## 2022-12-02 MED ORDER — SODIUM CHLORIDE 0.9% FLUSH: 10.0000 mL | INTRAVENOUS | Status: DC | PRN

## 2022-12-02 MED ORDER — SODIUM CHLORIDE 0.9 % IV BOLUS: 1000.0000 mL | Freq: Once | INTRAVENOUS | Status: DC

## 2022-12-02 MED ORDER — IPRATROPIUM-ALBUTEROL 0.5-2.5 (3) MG/3ML IN SOLN
RESPIRATORY_TRACT | Status: AC
  Filled 2022-12-02: qty 3

## 2022-12-02 MED ORDER — NOREPINEPHRINE 4 MG/250ML-% IV SOLN: 2.0000 ug/min | INTRAVENOUS | Status: DC

## 2022-12-02 MED ORDER — IBUPROFEN 400 MG PO TABS
600.0000 mg | ORAL_TABLET | Freq: Once | ORAL | Status: AC
  Administered 2022-12-02: 600 mg via ORAL
  Filled 2022-12-02: qty 1

## 2022-12-02 MED ORDER — SODIUM CHLORIDE 0.9 % IV SOLN
2.0000 g | INTRAVENOUS | Status: DC
  Administered 2022-12-02 – 2022-12-03 (×2): 2 g via INTRAVENOUS
  Filled 2022-12-02 (×2): qty 20

## 2022-12-02 MED ORDER — OXYCODONE HCL 5 MG PO TABS
10.0000 mg | ORAL_TABLET | Freq: Once | ORAL | Status: AC
  Administered 2022-12-02: 10 mg via ORAL
  Filled 2022-12-02: qty 2

## 2022-12-02 MED ORDER — IOHEXOL 350 MG/ML SOLN
100.0000 mL | Freq: Once | INTRAVENOUS | Status: AC | PRN
  Administered 2022-12-02: 100 mL via INTRAVENOUS

## 2022-12-02 MED ORDER — SODIUM CHLORIDE 0.9% FLUSH
10.0000 mL | Freq: Two times a day (BID) | INTRAVENOUS | Status: DC
  Administered 2022-12-02 – 2022-12-03 (×3): 10 mL
  Administered 2022-12-04: 40 mL
  Administered 2022-12-04 – 2022-12-08 (×8): 10 mL

## 2022-12-02 NOTE — ED Triage Notes (Addendum)
Pt arrives via EMS from home. Pt reports his legs gave out this morning, causing him to fall backwards. Pt c/o pain to lower back and both legs. Pt is AxOx4. Pt does take eliquis. Pt doesn't recall hitting his head. No obvious injury to head. NO loc .  Pt also reports cough and chills for the past 2-3 days

## 2022-12-02 NOTE — Progress Notes (Signed)
Peripherally Inserted Central Catheter Placement  The IV Nurse has discussed with the patient and/or persons authorized to consent for the patient, the purpose of this procedure and the potential benefits and risks involved with this procedure.  The benefits include less needle sticks, lab draws from the catheter, and the patient may be discharged home with the catheter. Risks include, but not limited to, infection, bleeding, blood clot (thrombus formation), and puncture of an artery; nerve damage and irregular heartbeat and possibility to perform a PICC exchange if needed/ordered by physician.  Alternatives to this procedure were also discussed.  Bard Power PICC patient education guide, fact sheet on infection prevention and patient information card has been provided to patient /or left at bedside.    PICC Placement Documentation  PICC Double Lumen 12/02/22 Right Basilic 44 cm 0 cm (Active)  Indication for Insertion or Continuance of Line Vasoactive infusions 12/02/22 1712  Exposed Catheter (cm) 0 cm 12/02/22 1712  Site Assessment Clean, Dry, Intact 12/02/22 1712  Lumen #1 Status Saline locked;Flushed;Blood return noted 12/02/22 1712  Lumen #2 Status Saline locked;Flushed;Blood return noted 12/02/22 1712  Dressing Type Transparent;Securing device 12/02/22 1712  Dressing Status Antimicrobial disc in place;Clean, Dry, Intact 12/02/22 1712  Line Care Connections checked and tightened 12/02/22 1712  Line Adjustment (NICU/IV Team Only) No 12/02/22 1712  Dressing Intervention New dressing 12/02/22 1712  Dressing Change Due 12/09/22 12/02/22 1712       Burnard Bunting Chenice 12/02/2022, 5:13 PM

## 2022-12-02 NOTE — H&P (Signed)
NAME:  [name], MRN:  [id number], DOB:  05-Dec-1945, LOS: 0 ADMISSION DATE:  12/02/2022, CONSULTATION DATE:  12/02/2022 REFERRING MD:  Dr. Rhunette Croft - EDP, CHIEF COMPLAINT:  Sepsis/hypotension   History of Present Illness:  [name] is a 77 year old male extensive past medical history significant for but not limited to diastolic CHF prior CVA, A-fib anticoagulated with Eliquis but inconsistent with dosing HTN, HLD, sleep apnea, paroxysmal atrial fibrillation, asthma, and anemia who presented to the ED via EMS after fall.  Patient reports attempted to ambulate to the restroom this morning at 3 AM at which time his legs gave out on him resulting in fall, patient states he was unable to stand and remained on the floor until EMS was called and assisted with transport to the hospital.  Reports bilateral shooting pain down both legs that radiates up his back as well  On ED arrival patient was seen bradycardic and hypotensive with mild temperature of 100.5.  Lab work significant for K3.4, chloride 95, creatinine 1.48, BUN 30, BNP 771, lactic 2.6 cleared to 1.5, WBC 22.0.  Sepsis workup started.  PCCM called for further management and workup.  Pertinent  Medical History  diastolic CHF prior CVA, A-fib anticoagulated with Eliquis but inconsistent with dosing HTN, HLD, sleep apnea, paroxysmal atrial fibrillation, asthma, and anemia  Significant Hospital Events: Including procedures, antibiotic start and stop dates in addition to other pertinent events   8/31 presented after fall with lower extremity weakness and pain, hypotensive in ED prompting PCCM consult  Interim History / Subjective:  Some intermittent confusion on situation  Objective   Blood pressure (!) 102/43, pulse (!) 56, temperature 98.6 F (37 C), resp. rate 17, height 6' (1.829 m), weight (!) 158.8 kg, SpO2 92%.        Intake/Output Summary (Last 24 hours) at 12/02/2022 1331 Last data filed at 12/02/2022 1136 Gross per  24 hour  Intake 250 ml  Output --  Net 250 ml   Filed Weights   12/02/22 0843  Weight: (!) 158.8 kg    Examination: General: Acute on chronic ill-appearing elderly male lying in bed in no acute distress HEENT: [place]/AT, MM pink/moist, PERRL,  Neuro: Alert and oriented x 3, nonfocal CV: s1s2 regular rate and rhythm, no murmur, rubs, or gallops,  PULM: Clear to auscultation bilaterally, no increased work of breathing, no added breath sounds GI: soft, bowel sounds active in all 4 quadrants, non-tender, non-distended Extremities: warm/dry, significant nonpitting bilateral lower extremity edema with erythema and warmth  Resolved Hospital Problem list     Assessment & Plan:  Shock likely septic unable to rule out component of cardiogenic at this time -Patient was seen with bradycardia, hypotensive, leukocytosis, and positive lactic acid on admission P: Admit ICU Supplemental oxygen as needed for sat goal greater than 92 Pan cultures prior to antibiotic IV ceftriaxone Gentle IV hydration provided on admission given history of CHF Utilize pressors for MAP goal greater than 65 Trend lactic acid Monitor urine output  Diastolic CHF  -Most recent echocardiogram January 2023 EF of 60 to 65% with LV hypertrophy, RV function within normal, moderately elevated pulmonary artery systolic pressure A-fib anticoagulated with Eliquis but inconsistent with dosing  Hypertension Hyperlipidemia P: Hold home medications Repeat echocardiogram Continuous telemetry Strict intake and output Daily weight Consider obtaining [place]ox once central access obtained  Acute Kidney Injury  -Creatinine 1.48 with GFR 49 on admission, creatinine WNL 10/2022 P: Follow renal function  Monitor urine output Trend Bmet  Avoid nephrotoxins Ensure adequate renal perfusion   Hypokalemia P: Supplement  Trend Bmet  History of lymphedema with concern for cellulitis on admission -Bilateral lower extremities assessed  with erythema and warmth on admission.  Patient states this is his baseline P: Antibiotics as above Supportive care  Elevated CK P: Trend to monitor for development of rhabdomyolysis given extended downtime  Best Practice (right click and "Reselect all SmartList Selections" daily)   Diet/type: Regular consistency (see orders) DVT prophylaxis:  GI prophylaxis: PPI Lines: N/A Foley:  N/A Code Status:  full code Last date of multidisciplinary goals of care discussion Pending  Labs   CBC: Recent Labs  Lab 12/02/22 0900  WBC 22.0*  HGB 11.9*  HCT 39.1  MCV 100.0  PLT 177    Basic Metabolic Panel: Recent Labs  Lab 12/02/22 0900  NA 139  K 3.4*  CL 95*  CO2 31  GLUCOSE 112*  BUN 30*  CREATININE 1.48*  CALCIUM 8.1*   GFR: Estimated Creatinine Clearance: 66.1 mL/min (A) (by C-G formula based on SCr of 1.48 mg/dL (H)). Recent Labs  Lab 12/02/22 0900 12/02/22 0943 12/02/22 1115  WBC 22.0*  --   --   LATICACIDVEN  --  2.6* 1.5    Liver Function Tests: No results for input(s): "AST", "ALT", "ALKPHOS", "BILITOT", "PROT", "ALBUMIN" in the last 168 hours. No results for input(s): "LIPASE", "AMYLASE" in the last 168 hours. No results for input(s): "AMMONIA" in the last 168 hours.  ABG    Component Value Date/Time   PHART 7.425 05/12/2021 1023   PCO2ART 51.5 (H) 05/12/2021 1023   PO2ART 83 05/12/2021 1023   HCO3 32.8 (H) 05/12/2021 1127   TCO2 35 (H) 05/12/2021 1127   O2SAT 84.0 05/12/2021 1127     Coagulation Profile: Recent Labs  Lab 12/02/22 0900  INR 1.6*    Cardiac Enzymes: Recent Labs  Lab 12/02/22 0900  CKTOTAL 566*    HbA1C: Hgb A1c MFr Bld  Date/Time Value Ref Range Status  10/10/2022 03:57 PM 6.3 4.6 - 6.5 % Final    Comment:    Glycemic Control Guidelines for People with Diabetes:Non Diabetic:  <6%Goal of Therapy: <7%Additional Action Suggested:  >8%   06/19/2022 03:12 PM 6.4 4.6 - 6.5 % Final    Comment:    Glycemic Control  Guidelines for People with Diabetes:Non Diabetic:  <6%Goal of Therapy: <7%Additional Action Suggested:  >8%     CBG: Recent Labs  Lab 12/02/22 0851  GLUCAP 91    Review of Systems:   Please see the history of present illness. 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   Surgical History:   Past Surgical History:  Procedure Laterality Date   ARTHROSCOPY KNEE W/ DRILLING     BREATH TEK H PYLORI N/A 12/23/2012   Procedure: BREATH TEK H PYLORI;  Surgeon: Atilano Ina, MD;  Location: [facility] ENDOSCOPY;  Service: General;  Laterality: N/A;  CHOLECYSTECTOMY  1989   COLONOSCOPY N/A 07/31/2013   Procedure: COLONOSCOPY;  Surgeon: Iva Boop, MD;  Location: [facility] ENDOSCOPY;  Service: Endoscopy;  Laterality: N/A;   ESOPHAGOGASTRODUODENOSCOPY N/A 07/22/2013   Procedure: ESOPHAGOGASTRODUODENOSCOPY (EGD);  Surgeon: Hilarie Fredrickson, MD;  Location: [facility] ENDOSCOPY;  Service: Endoscopy;  Laterality: N/A;   ESOPHAGOGASTRODUODENOSCOPY N/A 07/31/2013   Procedure: ESOPHAGOGASTRODUODENOSCOPY (EGD);  Surgeon: Iva Boop, MD;  Location[facility] END[facility]COPY;  Service: Endoscopy;  Laterality: N/A;   INTRAOPERATIVE TRANSESOPHAGEAL ECHOCARDIOGRAM N/A 07/18/2013    Procedure: INTRAOPERATIVE TRANSESOPHAGEAL ECHOCARDIOGRAM;  Surgeon: Delight Ovens, MD;  Location: [facility] OR;  Service: Open Heart Surgery;  Laterality: N/A;   LAPAROSCOPIC GASTRIC BANDING WITH HIATAL HERNIA REPAIR N/A 04/08/2013   Procedure: LAPAROSCOPIC GASTRIC BANDING WITH possible  HIATAL HERNIA REPAIR;  Surgeon: Atilano Ina, MD;  Location: [facility] ORS;  Service: General;  Laterality: N/A;   LITHOTRIPSY     renal calculi     RIGHT AND LEFT HEART CATH N/A 05/12/2021   Procedure: RIGHT AND LEFT HEART CATH;  Surgeon: Dolores Patty, MD;  Location: [facility] INVASIVE CV LAB;  Service: Cardiovascular;  Laterality: N/A;   SUBXYPHOID PERICARDIAL WINDOW N/A 07/18/2013   Procedure: SUBXYPHOID PERICARDIAL WINDOW;  Surgeon: Delight Ovens, MD;  Location: [facility] OR;  Service: Thoracic;  Laterality: N/A;   TONSILLECTOMY     total knee replacment     bilat   trigger finger repair     also lue, cts surgically repaired   WISDOM TOOTH EXTRACTION       Social History:   reports that he quit smoking about 48 years ago. His smoking use included cigarettes. He started smoking about 61 years ago. He has a 2.7 pack-year smoking history. He has never used smokeless tobacco. He reports that he does not drink alcohol and does not use drugs.   Family History:  His family history includes Dementia in his mother; Depression in his brother and mother; Diabetes in his cousin and paternal uncle; Heart disease in his father, paternal grandmother, and paternal uncle; Hypertension in his mother; Stroke in his paternal aunt.   Allergies Allergies  Allergen Reactions   Morphine Nausea And Vomiting and Other (See Comments)    Severe hallucinations Other reaction(s): Hallucinations   Penicillins Other (See Comments)    Passed out after injection Did it involve swelling of the face/tongue/throat, SOB, or low BP? Unknown Did it involve sudden or severe rash/hives, skin peeling, or any reaction on the inside of your mouth or nose?  No Did you need to seek medical attention at a hospital or doctor's office? Yes When did it last happen?    teenager   If all above answers are "NO", may proceed with cephalosporin use. Other reaction(s): Dizziness, Unknown   Codeine Nausea Only   Propoxyphene N-Acetaminophen Nausea Only     Home Medications  Prior to Admission medications   Medication Sig Start Date End Date Taking? Authorizing Provider  atorvastatin (LIPITOR) 80 MG tablet TAKE 1 TABLET(80 MG) BY MOUTH DAILY 08/26/22   [name], MD  carboxymethylcellulose (REFRESH PLUS) 0.5 % SOLN Place 1 drop into both eyes 3 (three) times daily as needed (dry eyes).    [provider]  [employer] MELATONIN GUMMIES PO Take 5 mg by mouth at bedtime.    [provider]  Docusate Sodium (DSS) 100 MG CAPS Take 100 mg by mouth every other day. 07/25/13   Jeralyn Bennett, MD  ELIQUIS 5 MG TABS tablet TAKE  1 TABLET(5 MG) BY MOUTH TWICE DAILY 08/26/22   [name], MD  empagliflozin (JARDIANCE) 10 MG TABS tablet TAKE 1 TABLET(10 MG) BY MOUTH DAILY BEFORE BREAKFAST 11/03/22   [name], Lisette Abu, MD  finasteride (PROSCAR) 5 MG tablet Take 1 tablet (5 mg total) by mouth daily. 07/12/16   Angiulli, Mcarthur Rossetti, PA-C  flecainide (TAMBOCOR) 50 MG tablet TAKE 1 TABLET(50 MG) BY MOUTH EVERY 12 HOURS 08/29/22   [name], Lisette Abu, MD  loratadine (CLARITIN) 10 MG tablet Take 10 mg by mouth daily.    [provider]  LORazepam (ATIVAN) 0.5 MG tablet Take 1 tablet (0.5 mg total) by mouth at bedtime. 11/27/22   Arfeen, Phillips Grout, MD  losartan (COZAAR) 25 MG tablet Take 1 tablet (25 mg total) by mouth daily. 07/24/22 07/19/23  Chrystie Nose, MD  metoprolol succinate (TOPROL-XL) 25 MG 24 hr tablet TAKE 1 TABLET(25 MG) BY MOUTH DAILY 03/31/22   [name], Lisette Abu, MD  montelukast (SINGULAIR) 10 MG tablet TAKE 1 TABLET(10 MG) BY MOUTH AT BEDTIME 08/26/22   [name], MD  Multiple Vitamin (MULTIVITAMIN WITH MINERALS) TABS tablet Take 1 tablet by  mouth daily.    [provider]  pantoprazole (PROTONIX) 40 MG tablet TAKE 1 TABLET(40 MG) BY MOUTH DAILY 08/26/22   [name], MD  potassium chloride SA (KLOR-CON M) 20 MEQ tablet Take 1 tablet (20 mEq total) by mouth 2 (two) times daily. 04/25/22   [name], Lisette Abu, MD  rOPINIRole (REQUIP) 0.5 MG tablet TAKE 1 TABLET(0.5 MG) BY MOUTH THREE TIMES DAILY 02/27/22   Jetty Duhamel D, MD  sertraline (ZOLOFT) 100 MG tablet Take 1.5 tablets (150 mg total) by mouth at bedtime. 08/07/22   Arfeen, Phillips Grout, MD  torsemide (DEMADEX) 20 MG tablet TAKE 3 TABLETS BY MOUTH EVERY DAY 11/20/22   [name], Lisette Abu, MD  traMADol (ULTRAM) 50 MG tablet TAKE 1 TABLET BY MOUTH EVERY 8 HOURS AS NEEDED 07/26/22   Jones Bales, NP  zinc gluconate 50 MG tablet Take 1 tablet (50 mg total) by mouth daily. 10/10/22   [name], MD     Critical care time:   Nigel Mormon. Harris, NP-C [facility] Personal contact information can be found on Amion  If no contact or response made please call [phone] 12/02/2022, 1:54 PM

## 2022-12-02 NOTE — ED Notes (Signed)
No iv fluids per PA due to patient's chf

## 2022-12-02 NOTE — Progress Notes (Signed)
   12/02/22 2118  BiPAP/CPAP/SIPAP  $ Non-Invasive Ventilator  Non-Invasive Vent Initial  $ Face Mask Medium Yes  BiPAP/CPAP/SIPAP Pt Type Adult  BiPAP/CPAP/SIPAP Resmed  Mask Type Full face mask  Mask Size Medium  IPAP 20 cmH20  EPAP 12 cmH2O  PEEP 12 cmH20  Flow Rate 5 lpm  Minute Ventilation 16.3  Leak 1  Tidal Volume (Vt) 975  Patient Home Equipment No  Nasal massage performed Yes  CPAP/SIPAP surface wiped down Yes  BiPAP/CPAP /SiPAP Vitals  SpO2 96 %  Bilateral Breath Sounds Clear

## 2022-12-02 NOTE — Progress Notes (Signed)
ED Pharmacy Antibiotic Sign Off An antibiotic consult was received from an ED provider for vancomycin per pharmacy dosing for pneumonia. A chart review was completed to assess appropriateness.  A single dose of ceftriaxone 2000 mg was placed by the ED provider.   The following one time order(s) were placed per pharmacy consult:  vancomycin 2000 mg x 1 dose MRSA PCR screen  Further antibiotic and/or antibiotic pharmacy consults should be ordered by the admitting provider if indicated.   Thank you for allowing pharmacy to be a part of this patient's care.   Delmar Landau, PharmD, BCPS 12/02/2022 11:00 AM ED Clinical Pharmacist -  [phone]

## 2022-12-02 NOTE — ED Provider Notes (Signed)
[place] EMERGENCY DEPARTMENT AT [facility] Provider Note   CSN: [id number] Arrival date & time: 12/02/22  4098     History  Chief Complaint  Patient presents with   Fall   Back Pain   Cough    [name] is a 77 y.o. male with past medical history asthma, hypertension, morbid obesity, A-fib anticoagulated on Eliquis, heart failure with last EF 60 to 65% in February 2023, who presents to the ED complaining of fall this morning.  He states that he was going to the bathroom around 3 AM when his legs gave out because he was so weak.  Notes that he has been generally weak for the last 2 to 3 days with a cough and not feeling well.  Unsure if he hit his head during the fall today.  Did not pass out.  No recent nausea, vomiting, or diarrhea.  No documented fever at home though he does have 1 on arrival to the ED.  He has not been able to ambulate since his fall and laid in his bathroom until brought to the ED by EMS.  Mostly complaining of diffuse lower back pain from landing on it during the fall.  Has a known history of sciatica.  States that he is having shooting pain down both of his legs but no pain specific and isolated to the legs.  All pain is radiating from his back.  No loss of bowel or bladder control.  No dysuria or hematuria.  No chest pain or shortness of breath.  Uses nasal cannula supplemental oxygen at baseline.      Home Medications Prior to Admission medications   Medication Sig Start Date End Date Taking? Authorizing Provider  atorvastatin (LIPITOR) 80 MG tablet TAKE 1 TABLET(80 MG) BY MOUTH DAILY 08/26/22   [name], MD  carboxymethylcellulose (REFRESH PLUS) 0.5 % SOLN Place 1 drop into both eyes 3 (three) times daily as needed (dry eyes).    [provider]  [facility] MELATONIN GUMMIES PO Take 5 mg by mouth at bedtime.    [provider]  Docusate Sodium (DSS) 100 MG CAPS Take 100 mg by mouth every other day. 07/25/13   Jeralyn Bennett, MD  ELIQUIS 5 MG TABS tablet TAKE 1 TABLET(5 MG) BY MOUTH TWICE DAILY 08/26/22   [name], MD  empagliflozin (JARDIANCE) 10 MG TABS tablet TAKE 1 TABLET(10 MG) BY MOUTH DAILY BEFORE BREAKFAST 11/03/22   [name], Lisette Abu, MD  finasteride (PROSCAR) 5 MG tablet Take 1 tablet (5 mg total) by mouth daily. 07/12/16   Angiulli, Mcarthur Rossetti, PA-C  flecainide (TAMBOCOR) 50 MG tablet TAKE 1 TABLET(50 MG) BY MOUTH EVERY 12 HOURS 08/29/22   [name], Lisette Abu, MD  loratadine (CLARITIN) 10 MG tablet Take 10 mg by mouth daily.    [provider]  LORazepam (ATIVAN) 0.5 MG tablet Take 1 tablet (0.5 mg total) by mouth at bedtime. 11/27/22   Arfeen, Phillips Grout, MD  losartan (COZAAR) 25 MG tablet Take 1 tablet (25 mg total) by mouth daily. 07/24/22 07/19/23  Chrystie Nose, MD  metoprolol succinate (TOPROL-XL) 25 MG 24 hr tablet TAKE 1 TABLET(25 MG) BY MOUTH DAILY 03/31/22   [name], Lisette Abu, MD  montelukast (SINGULAIR) 10 MG tablet TAKE 1 TABLET(10 MG) BY MOUTH AT BEDTIME 08/26/22   [name], MD  Multiple Vitamin (MULTIVITAMIN WITH MINERALS) TABS tablet Take 1 tablet by mouth daily.    [provider]  pantoprazole (PROTONIX) 40 MG tablet TAKE 1 TABLET(40 MG) BY MOUTH DAILY 08/26/22   [name], MD  potassium chloride SA (KLOR-CON M) 20 MEQ tablet Take 1 tablet (20 mEq total) by mouth 2 (two) times daily. 04/25/22   [name], Lisette Abu, MD  rOPINIRole (REQUIP) 0.5 MG tablet TAKE 1 TABLET(0.5 MG) BY MOUTH THREE TIMES DAILY 02/27/22   Jetty Duhamel D, MD  sertraline (ZOLOFT) 100 MG tablet Take 1.5 tablets (150 mg total) by mouth at bedtime. 08/07/22   Arfeen, Phillips Grout, MD  torsemide (DEMADEX) 20 MG tablet TAKE 3 TABLETS BY MOUTH EVERY DAY 11/20/22   [name], Lisette Abu, MD  traMADol (ULTRAM) 50 MG tablet TAKE 1 TABLET BY MOUTH EVERY 8 HOURS AS NEEDED 07/26/22   Jones Bales, NP  zinc gluconate 50 MG tablet Take 1 tablet (50 mg total) by mouth daily. 10/10/22   [name], MD       Allergies    Morphine, Penicillins, Codeine, and Propoxyphene n-acetaminophen    Review of Systems   Review of Systems  All other systems reviewed and are negative.   Physical Exam Updated Vital Signs BP (!) 101/49   Pulse (!) 59   Temp 98.6 F (37 C)   Resp 19   Ht 6' (1.829 m)   Wt (!) 158.8 kg   SpO2 96%   BMI 47.47 kg/m  Physical Exam Vitals and nursing note reviewed.  Constitutional:      General: He is not in acute distress.    Appearance: He is not diaphoretic.     Comments: Generally weak  HENT:     Head: Normocephalic and atraumatic.     Comments: No Battle sign, no raccoon eyes, no hemotympanum, facial bones stable    Right Ear: External ear normal.     Left Ear: External ear normal.     Nose: Nose normal.     Mouth/Throat:     Mouth: Mucous membranes are dry.  Eyes:     Extraocular Movements: Extraocular movements intact.     Conjunctiva/sclera: Conjunctivae normal.     Pupils: Pupils are equal, round, and reactive to light.  Neck:     Comments: No midline tenderness or deformities Cardiovascular:     Rate and Rhythm: Normal rate and regular rhythm.  Pulmonary:     Breath sounds: No stridor. No wheezing, rhonchi or rales.     Comments: Mildly tachypneic with speaking in full sentences, normal effort at rest Abdominal:     General: Abdomen is flat. There is no distension.     Palpations: Abdomen is soft.  Musculoskeletal:     Cervical back: Normal range of motion and neck supple. No rigidity.     Right lower leg: Edema present.     Left lower leg: Edema present.     Comments: Chronic edematous and erythematous changes to the bilateral lower extremities, patient states at baseline, bilateral lower extremities palpated and nontender without obvious deformity and with grossly intact range of motion, distal pulses and sensation intact, bilateral upper extremities palpated and nontender without obvious deformity and with intact range of motion, diffuse  lumbar spine tenderness, no spinal deformities or stepoffs palpated  Skin:    General: Skin is warm and dry.     Capillary Refill: Capillary refill takes less than 2 seconds.  Neurological:     General: No focal deficit present.     Mental Status: He is alert and oriented to person, place, and time.  Cranial Nerves: Cranial nerves 2-12 are intact.     ED Results / Procedures / Treatments   Labs (all labs ordered are listed, but only abnormal results are displayed) Labs Reviewed  BASIC METABOLIC PANEL - Abnormal; Notable for the following components:      Result Value   Potassium 3.4 (*)    Chloride 95 (*)    Glucose, Bld 112 (*)    BUN 30 (*)    Creatinine, Ser 1.48 (*)    Calcium 8.1 (*)    GFR, Estimated 49 (*)    All other components within normal limits  CBC - Abnormal; Notable for the following components:   WBC 22.0 (*)    RBC 3.91 (*)    Hemoglobin 11.9 (*)    All other components within normal limits  URINALYSIS, ROUTINE W REFLEX MICROSCOPIC - Abnormal; Notable for the following components:   Glucose, UA >=500 (*)    Bacteria, UA RARE (*)    All other components within normal limits  BRAIN NATRIURETIC PEPTIDE - Abnormal; Notable for the following components:   B Natriuretic Peptide 771.9 (*)    All other components within normal limits  CK - Abnormal; Notable for the following components:   Total CK 566 (*)    All other components within normal limits  PROTIME-INR - Abnormal; Notable for the following components:   Prothrombin Time 19.4 (*)    INR 1.6 (*)    All other components within normal limits  I-STAT CG4 LACTIC ACID, ED - Abnormal; Notable for the following components:   Lactic Acid, Venous 2.6 (*)    All other components within normal limits  SARS CORONAVIRUS 2 BY RT PCR  CULTURE, BLOOD (ROUTINE X 2)  CULTURE, BLOOD (ROUTINE X 2)  MRSA NEXT GEN BY PCR, NASAL  APTT  CBG MONITORING, ED  I-STAT CG4 LACTIC ACID, ED    EKG None  Radiology CT  Angio Chest PE W and/or Wo Contrast  Result Date: 12/02/2022 CLINICAL DATA:  Cough.  Pulmonary embolism suspected. EXAM: CT ANGIOGRAPHY CHEST WITH CONTRAST TECHNIQUE: Multidetector CT imaging of the chest was performed using the standard protocol during bolus administration of intravenous contrast. Multiplanar CT image reconstructions and MIPs were obtained to evaluate the vascular anatomy. RADIATION DOSE REDUCTION: This exam was performed according to the departmental dose-optimization program which includes automated exposure control, adjustment of the mA and/or kV according to patient size and/or use of iterative reconstruction technique. CONTRAST:  OMNIPAQUE IOHEXOL 350 MG/ML SOLN COMPARISON:  Chest radiograph earlier today.  CTA chest 03/22/2021 FINDINGS: Cardiovascular: The quality of this exam for evaluation of pulmonary embolism is poor to moderate. The bolus is suboptimally timed with contrast entering the SVC. Exam also degraded by patient body habitus. To the upper lungs, no embolism to the large segmental level. To the lower lungs, no embolism to the lobar to large segmental level. Smaller emboli cannot be excluded. 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 Aortic atherosclerosis. Ascending aortic dilatation is mild. Based on coronal reformats, 4.3 cm on 84/8. Increased from 4.1 cm when remeasured in a similar fashion on the prior. Moderate cardiomegaly, without pericardial effusion. Three vessel coronary artery calcification. Pulmonary artery enlargement, outflow tract 3.8 cm. Mediastinum/Nodes: No mediastinal or hilar adenopathy. Lungs/Pleura: No pleural fluid. Minimal exclusion of the left lung base. Mild volume loss and subsegmental atelectasis at the right lung base. Marked right hemidiaphragm elevation. Upper Abdomen: Lap band, incompletely imaged. Nonspecific caudate lobe enlargement. Normal imaged  portions of the spleen. Musculoskeletal: Mild convex right  thoracic spine curvature. Review of the MIP images confirms the above findings. IMPRESSION: 1. Portal moderate evaluation for pulmonary embolism. No large embolism. 2. No explanation for cough 3. Coronary artery atherosclerosis. Aortic Atherosclerosis (ICD10-I70.0). 4. Pulmonary artery enlargement suggests pulmonary arterial hypertension. 5. Ascending aortic dilatation at up to 4.3 cm. Recommend annual imaging followup by CTA or MRA. This recommendation follows 2010 ACCF/AHA/AATS/ACR/ASA/SCA/SCAI/SIR/STS/SVM Guidelines for the Diagnosis and Management of Patients with Thoracic Aortic Disease. Circulation. 2010; 121: X323-F573. Aortic aneurysm NOS (ICD10-I71.9) Electronically Signed   By: Jeronimo Greaves M.D.   On: 12/02/2022 12:11   CT Head Wo Contrast  Result Date: 12/02/2022 CLINICAL DATA:  Patient fell backwards. Chronic anticoagulation. Back injury. EXAM: CT HEAD WITHOUT CONTRAST CT CERVICAL SPINE WITHOUT CONTRAST TECHNIQUE: Multidetector CT imaging of the head and cervical spine was performed following the standard protocol without intravenous contrast. Multiplanar CT image reconstructions of the cervical spine were also generated. RADIATION DOSE REDUCTION: This exam was performed according to the departmental dose-optimization program which includes automated exposure control, adjustment of the mA and/or kV according to patient size and/or use of iterative reconstruction technique. COMPARISON:  None Available. FINDINGS: CT HEAD FINDINGS Brain: There is no evidence for acute hemorrhage, hydrocephalus, mass lesion, or abnormal extra-axial fluid collection. No definite CT evidence for acute infarction. Diffuse loss of parenchymal volume is consistent with atrophy. Patchy low attenuation in the deep hemispheric and periventricular white matter is nonspecific, but likely reflects chronic microvascular ischemic demyelination. Vascular: No hyperdense vessel or unexpected calcification. Skull: No evidence for  fracture. No worrisome lytic or sclerotic lesion. Sinuses/Orbits: The visualized paranasal sinuses and mastoid air cells are clear. Visualized portions of the globes and intraorbital fat are unremarkable. Other: None. CT CERVICAL SPINE FINDINGS Alignment: Normal. Skull base and vertebrae: No acute fracture. No primary bone lesion or focal pathologic process. Soft tissues and spinal canal: No prevertebral fluid or swelling. No visible canal hematoma. Disc levels: Diffuse loss of intervertebral disc height noted in the cervical spine. Facets are well aligned with mild degenerative changes at upper cervical levels. Upper chest: Unremarkable Other: None. IMPRESSION: 1. No acute intracranial abnormality. Atrophy with chronic small vessel white matter ischemic disease. 2. Degenerative changes in the cervical spine without fracture or subluxation. Electronically Signed   By: Kennith Center M.D.   On: 12/02/2022 10:44   CT Cervical Spine Wo Contrast  Result Date: 12/02/2022 CLINICAL DATA:  Patient fell backwards. Chronic anticoagulation. Back injury. EXAM: CT HEAD WITHOUT CONTRAST CT CERVICAL SPINE WITHOUT CONTRAST TECHNIQUE: Multidetector CT imaging of the head and cervical spine was performed following the standard protocol without intravenous contrast. Multiplanar CT image reconstructions of the cervical spine were also generated. RADIATION DOSE REDUCTION: This exam was performed according to the departmental dose-optimization program which includes automated exposure control, adjustment of the mA and/or kV according to patient size and/or use of iterative reconstruction technique. COMPARISON:  None Available. FINDINGS: CT HEAD FINDINGS Brain: There is no evidence for acute hemorrhage, hydrocephalus, mass lesion, or abnormal extra-axial fluid collection. No definite CT evidence for acute infarction. Diffuse loss of parenchymal volume is consistent with atrophy. Patchy low attenuation in the deep hemispheric and  periventricular white matter is nonspecific, but likely reflects chronic microvascular ischemic demyelination. Vascular: No hyperdense vessel or unexpected calcification. Skull: No evidence for fracture. No worrisome lytic or sclerotic lesion. Sinuses/Orbits: The visualized paranasal sinuses and mastoid air cells are clear. Visualized portions of the globes and  intraorbital fat are unremarkable. Other: None. CT CERVICAL SPINE FINDINGS Alignment: Normal. Skull base and vertebrae: No acute fracture. No primary bone lesion or focal pathologic process. Soft tissues and spinal canal: No prevertebral fluid or swelling. No visible canal hematoma. Disc levels: Diffuse loss of intervertebral disc height noted in the cervical spine. Facets are well aligned with mild degenerative changes at upper cervical levels. Upper chest: Unremarkable Other: None. IMPRESSION: 1. No acute intracranial abnormality. Atrophy with chronic small vessel white matter ischemic disease. 2. Degenerative changes in the cervical spine without fracture or subluxation. Electronically Signed   By: Kennith Center M.D.   On: 12/02/2022 10:44   CT Lumbar Spine Wo Contrast  Result Date: 12/02/2022 CLINICAL DATA:  Lumbar radiculopathy after trauma.  Back pain. EXAM: CT LUMBAR SPINE WITHOUT CONTRAST TECHNIQUE: Multidetector CT imaging of the lumbar spine was performed without intravenous contrast administration. Multiplanar CT image reconstructions were also generated. RADIATION DOSE REDUCTION: This exam was performed according to the departmental dose-optimization program which includes automated exposure control, adjustment of the mA and/or kV according to patient size and/or use of iterative reconstruction technique. COMPARISON:  Lumbar spine MRI from 10/14/2018 FINDINGS: Segmentation: 5 lumbar type vertebrae. Alignment: Mild retrolisthesis of L4 on L5 measuring up to 3 mm. Unchanged from the previous exam. Stable straightening of lumbar lordosis.  Vertebrae: Unchanged benign vertebral hemangioma at L3-4. The lumbar vertebral body heights are well preserved without signs of acute fracture. Paraspinal and other soft tissues: Partially visualized conglomeration right kidney cysts are again noted measuring approximately 5.9 x 4.7 cm. Previously characterized as benign cysts. Aortic atherosclerotic calcifications noted. Disc levels: Multilevel disc space narrowing and endplate spurring identified. This is severe at the L4-5 level with marked disc space narrowing, endplate spurring and vacuum disc. There is mild to moderate facet hypertrophy noted. Posterior disc bulge/protrusion is again noted. Again seen is mild bilateral lateral recess stenosis. IMPRESSION: 1. No evidence for acute fracture or subluxation of the lumbar spine. 2. Multilevel degenerative disc disease, most severe at the L4-5 level. 3.  Aortic Atherosclerosis (ICD10-I70.0). Electronically Signed   By: Signa Kell M.D.   On: 12/02/2022 10:40   DG Chest Portable 1 View  Result Date: 12/02/2022 CLINICAL DATA:  Cough. EXAM: PORTABLE CHEST 1 VIEW COMPARISON:  03/22/2021 FINDINGS: Asymmetric elevation right hemidiaphragm. The cardio pericardial silhouette is enlarged. Vascular congestion with diffuse interstitial opacities suggesting edema. No focal consolidation or substantial pleural effusion. Telemetry leads overlie the chest. IMPRESSION: Vascular congestion with diffuse interstitial opacities suggesting edema. Electronically Signed   By: Kennith Center M.D.   On: 12/02/2022 09:24    Procedures .Critical Care  Performed by: Tonette Lederer, PA-C Authorized by: Tonette Lederer, PA-C   Critical care provider statement:    Critical care time (minutes):  75   Critical care start time:  12/02/2022 10:00 AM   Critical care end time:  12/02/2022 2:00 PM   Critical care time was exclusive of:  Separately billable procedures and treating other patients and teaching time   Critical care was  necessary to treat or prevent imminent or life-threatening deterioration of the following conditions:  Cardiac failure, circulatory failure, renal failure, respiratory failure, sepsis, shock, CNS failure or compromise, dehydration and metabolic crisis   Critical care was time spent personally by me on the following activities:  Development of treatment plan with patient or surrogate, discussions with consultants, evaluation of patient's response to treatment, examination of patient, ordering and review of laboratory studies, ordering  and review of radiographic studies, ordering and performing treatments and interventions, pulse oximetry, re-evaluation of patient's condition, review of old charts and obtaining history from patient or surrogate   Care discussed with: admitting provider       Medications Ordered in ED Medications  lactated ringers infusion ( Intravenous New Bag/Given 12/02/22 1136)  cefTRIAXone (ROCEPHIN) 2 g in sodium chloride 0.9 % 100 mL IVPB (0 g Intravenous Stopped 12/02/22 1132)  vancomycin (VANCOREADY) IVPB 2000 mg/400 mL (2,000 mg Intravenous New Bag/Given 12/02/22 1133)  ibuprofen (ADVIL) tablet 600 mg (600 mg Oral Given 12/02/22 1014)  oxyCODONE (Oxy IR/ROXICODONE) immediate release tablet 10 mg (10 mg Oral Given 12/02/22 1014)  lactated ringers bolus 500 mL (0 mLs Intravenous Stopped 12/02/22 1136)  iohexol (OMNIPAQUE) 350 MG/ML injection 100 mL (100 mLs Intravenous Contrast Given 12/02/22 1153)    ED Course/ Medical Decision Making/ A&P                                 Medical Decision Making Amount and/or Complexity of Data Reviewed Labs: ordered. Decision-making details documented in ED Course. Radiology: ordered. Decision-making details documented in ED Course. ECG/medicine tests: ordered. Decision-making details documented in ED Course.  Risk Prescription drug management. Decision regarding hospitalization.   Medical Decision Making:   ARDIS VOTTO is a 77  y.o. male who presented to the ED today with fall, weakness detailed above.    Patient's presentation is complicated by their history of multiple comorbidities.  Patient placed on continuous vitals and telemetry monitoring while in ED which was reviewed periodically.  Complete initial physical exam performed, notably the patient was febrile, generally weak, alert and oriented, and with diffuse lumbar spinal tenderness without stepoffs.    Reviewed and confirmed nursing documentation for past medical history, family history, social history.    Initial Assessment:   With the patient's presentation, differential diagnosis includes but is not limited to viral illness, sepsis, pneumonia, UTI, cellulitis, syncope, fracture, dislocation, disk herniation, spinal cord injury, head injury/ICH.  This is most consistent with an acute complicated illness  Initial Plan:  Screening labs including CBC and Metabolic panel to evaluate for infectious or metabolic etiology of disease.  Urinalysis with reflex culture ordered to evaluate for UTI or relevant urologic/nephrologic pathology.  CXR to evaluate for structural/infectious intrathoracic pathology.  EKG to evaluate for cardiac pathology Lactic acid to assess for sepsis CT brain, C-spine, L-spine to assess for traumatic injuries COVID swab to assess for viral etiology Ibuprofen as antipyretic, patient reports he is unsure if he can take Tylenol and has a documented allergy Pain control Fluids secondary to hypotension at presentation Objective evaluation as below reviewed   Initial Study Results:   Laboratory  All laboratory results reviewed without evidence of clinically relevant pathology.   Exceptions include: K3.4, chloride 95, creatinine 1.48 with previous 1.18, BNP 771, CK5 66, WBC 22, hemoglobin 11.9  EKG EKG was reviewed independently.  Sinus rhythm.  Right bundle branch block.  No acute ST-T changes.  No STEMI.  Radiology:  All images  reviewed independently. Agree with radiology report at this time.   CT Angio Chest PE W and/or Wo Contrast  Result Date: 12/02/2022 CLINICAL DATA:  Cough.  Pulmonary embolism suspected. EXAM: CT ANGIOGRAPHY CHEST WITH CONTRAST TECHNIQUE: Multidetector CT imaging of the chest was performed using the standard protocol during bolus administration of intravenous contrast. Multiplanar CT image reconstructions and MIPs were obtained  to evaluate the vascular anatomy. RADIATION DOSE REDUCTION: This exam was performed according to the departmental dose-optimization program which includes automated exposure control, adjustment of the mA and/or kV according to patient size and/or use of iterative reconstruction technique. CONTRAST:  OMNIPAQUE IOHEXOL 350 MG/ML SOLN COMPARISON:  Chest radiograph earlier today.  CTA chest 03/22/2021 FINDINGS: Cardiovascular: The quality of this exam for evaluation of pulmonary embolism is poor to moderate. The bolus is suboptimally timed with contrast entering the SVC. Exam also degraded by patient body habitus. To the upper lungs, no embolism to the large segmental level. To the lower lungs, no embolism to the lobar to large segmental level. Smaller emboli cannot be excluded. 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 Aortic atherosclerosis. Ascending aortic dilatation is mild. Based on coronal reformats, 4.3 cm on 84/8. Increased from 4.1 cm when remeasured in a similar fashion on the prior. Moderate cardiomegaly, without pericardial effusion. Three vessel coronary artery calcification. Pulmonary artery enlargement, outflow tract 3.8 cm. Mediastinum/Nodes: No mediastinal or hilar adenopathy. Lungs/Pleura: No pleural fluid. Minimal exclusion of the left lung base. Mild volume loss and subsegmental atelectasis at the right lung base. Marked right hemidiaphragm elevation. Upper Abdomen: Lap band, incompletely imaged. Nonspecific caudate lobe enlargement.  Normal imaged portions of the spleen. Musculoskeletal: Mild convex right thoracic spine curvature. Review of the MIP images confirms the above findings. IMPRESSION: 1. Portal moderate evaluation for pulmonary embolism. No large embolism. 2. No explanation for cough 3. Coronary artery atherosclerosis. Aortic Atherosclerosis (ICD10-I70.0). 4. Pulmonary artery enlargement suggests pulmonary arterial hypertension. 5. Ascending aortic dilatation at up to 4.3 cm. Recommend annual imaging followup by CTA or MRA. This recommendation follows 2010 ACCF/AHA/AATS/ACR/ASA/SCA/SCAI/SIR/STS/SVM Guidelines for the Diagnosis and Management of Patients with Thoracic Aortic Disease. Circulation. 2010; 121: H062-B762. Aortic aneurysm NOS (ICD10-I71.9) Electronically Signed   By: Jeronimo Greaves M.D.   On: 12/02/2022 12:11   CT Head Wo Contrast  Result Date: 12/02/2022 CLINICAL DATA:  Patient fell backwards. Chronic anticoagulation. Back injury. EXAM: CT HEAD WITHOUT CONTRAST CT CERVICAL SPINE WITHOUT CONTRAST TECHNIQUE: Multidetector CT imaging of the head and cervical spine was performed following the standard protocol without intravenous contrast. Multiplanar CT image reconstructions of the cervical spine were also generated. RADIATION DOSE REDUCTION: This exam was performed according to the departmental dose-optimization program which includes automated exposure control, adjustment of the mA and/or kV according to patient size and/or use of iterative reconstruction technique. COMPARISON:  None Available. FINDINGS: CT HEAD FINDINGS Brain: There is no evidence for acute hemorrhage, hydrocephalus, mass lesion, or abnormal extra-axial fluid collection. No definite CT evidence for acute infarction. Diffuse loss of parenchymal volume is consistent with atrophy. Patchy low attenuation in the deep hemispheric and periventricular white matter is nonspecific, but likely reflects chronic microvascular ischemic demyelination. Vascular: No  hyperdense vessel or unexpected calcification. Skull: No evidence for fracture. No worrisome lytic or sclerotic lesion. Sinuses/Orbits: The visualized paranasal sinuses and mastoid air cells are clear. Visualized portions of the globes and intraorbital fat are unremarkable. Other: None. CT CERVICAL SPINE FINDINGS Alignment: Normal. Skull base and vertebrae: No acute fracture. No primary bone lesion or focal pathologic process. Soft tissues and spinal canal: No prevertebral fluid or swelling. No visible canal hematoma. Disc levels: Diffuse loss of intervertebral disc height noted in the cervical spine. Facets are well aligned with mild degenerative changes at upper cervical levels. Upper chest: Unremarkable Other: None. IMPRESSION: 1. No acute intracranial abnormality. Atrophy with chronic small vessel white matter ischemic  disease. 2. Degenerative changes in the cervical spine without fracture or subluxation. Electronically Signed   By: Kennith Center M.D.   On: 12/02/2022 10:44   CT Cervical Spine Wo Contrast  Result Date: 12/02/2022 CLINICAL DATA:  Patient fell backwards. Chronic anticoagulation. Back injury. EXAM: CT HEAD WITHOUT CONTRAST CT CERVICAL SPINE WITHOUT CONTRAST TECHNIQUE: Multidetector CT imaging of the head and cervical spine was performed following the standard protocol without intravenous contrast. Multiplanar CT image reconstructions of the cervical spine were also generated. RADIATION DOSE REDUCTION: This exam was performed according to the departmental dose-optimization program which includes automated exposure control, adjustment of the mA and/or kV according to patient size and/or use of iterative reconstruction technique. COMPARISON:  None Available. FINDINGS: CT HEAD FINDINGS Brain: There is no evidence for acute hemorrhage, hydrocephalus, mass lesion, or abnormal extra-axial fluid collection. No definite CT evidence for acute infarction. Diffuse loss of parenchymal volume is consistent  with atrophy. Patchy low attenuation in the deep hemispheric and periventricular white matter is nonspecific, but likely reflects chronic microvascular ischemic demyelination. Vascular: No hyperdense vessel or unexpected calcification. Skull: No evidence for fracture. No worrisome lytic or sclerotic lesion. Sinuses/Orbits: The visualized paranasal sinuses and mastoid air cells are clear. Visualized portions of the globes and intraorbital fat are unremarkable. Other: None. CT CERVICAL SPINE FINDINGS Alignment: Normal. Skull base and vertebrae: No acute fracture. No primary bone lesion or focal pathologic process. Soft tissues and spinal canal: No prevertebral fluid or swelling. No visible canal hematoma. Disc levels: Diffuse loss of intervertebral disc height noted in the cervical spine. Facets are well aligned with mild degenerative changes at upper cervical levels. Upper chest: Unremarkable Other: None. IMPRESSION: 1. No acute intracranial abnormality. Atrophy with chronic small vessel white matter ischemic disease. 2. Degenerative changes in the cervical spine without fracture or subluxation. Electronically Signed   By: Kennith Center M.D.   On: 12/02/2022 10:44   CT Lumbar Spine Wo Contrast  Result Date: 12/02/2022 CLINICAL DATA:  Lumbar radiculopathy after trauma.  Back pain. EXAM: CT LUMBAR SPINE WITHOUT CONTRAST TECHNIQUE: Multidetector CT imaging of the lumbar spine was performed without intravenous contrast administration. Multiplanar CT image reconstructions were also generated. RADIATION DOSE REDUCTION: This exam was performed according to the departmental dose-optimization program which includes automated exposure control, adjustment of the mA and/or kV according to patient size and/or use of iterative reconstruction technique. COMPARISON:  Lumbar spine MRI from 10/14/2018 FINDINGS: Segmentation: 5 lumbar type vertebrae. Alignment: Mild retrolisthesis of L4 on L5 measuring up to 3 mm. Unchanged from  the previous exam. Stable straightening of lumbar lordosis. Vertebrae: Unchanged benign vertebral hemangioma at L3-4. The lumbar vertebral body heights are well preserved without signs of acute fracture. Paraspinal and other soft tissues: Partially visualized conglomeration right kidney cysts are again noted measuring approximately 5.9 x 4.7 cm. Previously characterized as benign cysts. Aortic atherosclerotic calcifications noted. Disc levels: Multilevel disc space narrowing and endplate spurring identified. This is severe at the L4-5 level with marked disc space narrowing, endplate spurring and vacuum disc. There is mild to moderate facet hypertrophy noted. Posterior disc bulge/protrusion is again noted. Again seen is mild bilateral lateral recess stenosis. IMPRESSION: 1. No evidence for acute fracture or subluxation of the lumbar spine. 2. Multilevel degenerative disc disease, most severe at the L4-5 level. 3.  Aortic Atherosclerosis (ICD10-I70.0). Electronically Signed   By: Signa Kell M.D.   On: 12/02/2022 10:40   DG Chest Portable 1 View  Result Date: 12/02/2022 CLINICAL DATA:  Cough. EXAM: PORTABLE CHEST 1 VIEW COMPARISON:  03/22/2021 FINDINGS: Asymmetric elevation right hemidiaphragm. The cardio pericardial silhouette is enlarged. Vascular congestion with diffuse interstitial opacities suggesting edema. No focal consolidation or substantial pleural effusion. Telemetry leads overlie the chest. IMPRESSION: Vascular congestion with diffuse interstitial opacities suggesting edema. Electronically Signed   By: Kennith Center M.D.   On: 12/02/2022 09:24      Consults: Case discussed with hospitalist Dr. Katrinka Blazing who recommended ICU consult given patient's low blood pressures.   Case discussed with Whitney with ICU team, attending will be by to see patient and determine admission to ICU versus floor.  Final Assessment and Plan:   77 year old male presents to the ED with generalized weakness and cough.   Soft blood pressures on arrival with mild bradycardia.  Generally weak, no focal deficits identified.  Has had a recent cough, and is on baseline O2 requirement.  Lungs clear to auscultation, no overt respiratory distress though does become easily tachypneic with speaking in full sentences.  Febrile on arrival.  Labs revealed leukocytosis of 22.  Weakness did cause episodic collapse.  Complaining mostly of lower back pain.  He is anticoagulated so obtain CT imaging of the head, neck, and L-spine.  No significant findings on these.  UA does not appear infected.  Chest x-ray with slight pulmonary edema but no focal consolidation.  On reexam, patient did note that he has been intermittently taking his Eliquis and has a history of recurrent PE.  With tachypnea and vital sign changes in the setting of his recent symptoms, discussed recommendation to obtain CT PE study for both rule out of PE as well as further evaluation of lungs and patient agreeable.  No PE on scan.  BNP is elevated.  Patient notes that lower extremity edema and skin changes are at baseline for him but could be some heart failure component with patient's change in respiratory status and recent cough.  Labs also show an AKI.  Initially with bouts of hypotension patient given slow bolus of fluids but these were discontinued in favor of maintenance fluids when elevated BNP, continue on maintenance given AKI.  Blood pressure did not respond well to small bolus and maintenance fluids.  It remains soft.  COVID swab negative.  No infectious process identified on CT scan.  Started on broad-spectrum antibiotics though unclear source of infection.  Abdomen soft and nontender, low suspicion for abdominal process.  Given patient's multiple lab derangements, consulted hospital medicine for admission but they were uncomfortable with admitting given patient's soft blood pressures in setting of heart failure exacerbation, sepsis, and AKI.  Discussed with intensivist  and will admit to the ICU for septic shock.  Patient agreeable with this plan.  Though he is borderline hypotensive, mentation has been maintained and patient remained stable.   Clinical Impression:  1. Septic shock (HCC)   2. Acute on chronic congestive heart failure, unspecified heart failure type (HCC)   3. Sepsis with acute organ dysfunction without septic shock, due to unspecified organism, unspecified organ dysfunction type (HCC)   4. Lactic acidosis   5. Acute kidney injury (HCC)   6. Acute cough   7. Generalized weakness      Admit           Final Clinical Impression(s) / ED Diagnoses Final diagnoses:  Acute on chronic congestive heart failure, unspecified heart failure type (HCC)  Sepsis with acute organ dysfunction without septic shock, due to unspecified organism, unspecified organ dysfunction type (HCC)  Lactic acidosis  Acute kidney injury (HCC)  Acute cough  Generalized weakness  Septic shock [facility])    Rx / DC Orders ED Discharge Orders     None         Tonette Lederer, PA-C 12/02/22 1514    Derwood Kaplan, MD 12/02/22 2326

## 2022-12-02 NOTE — Sepsis Progress Note (Signed)
Elink following code sepsis °

## 2022-12-03 ENCOUNTER — Inpatient Hospital Stay (HOSPITAL_COMMUNITY): Payer: Medicare Other

## 2022-12-03 DIAGNOSIS — A419 Sepsis, unspecified organism: Secondary | ICD-10-CM | POA: Diagnosis not present

## 2022-12-03 DIAGNOSIS — R6521 Severe sepsis with septic shock: Secondary | ICD-10-CM | POA: Diagnosis not present

## 2022-12-03 DIAGNOSIS — I5031 Acute diastolic (congestive) heart failure: Secondary | ICD-10-CM

## 2022-12-03 LAB — BASIC METABOLIC PANEL
Anion gap: 15 (ref 5–15)
BUN: 40 mg/dL — ABNORMAL HIGH (ref 8–23)
CO2: 28 mmol/L (ref 22–32)
Calcium: 8.1 mg/dL — ABNORMAL LOW (ref 8.9–10.3)
Chloride: 95 mmol/L — ABNORMAL LOW (ref 98–111)
Creatinine, Ser: 1.58 mg/dL — ABNORMAL HIGH (ref 0.61–1.24)
GFR, Estimated: 45 mL/min — ABNORMAL LOW (ref >60)
Glucose, Bld: 126 mg/dL — ABNORMAL HIGH (ref 70–99)
Potassium: 3.2 mmol/L — ABNORMAL LOW (ref 3.5–5.1)
Sodium: 138 mmol/L (ref 135–145)

## 2022-12-03 LAB — ECHOCARDIOGRAM COMPLETE
AR max vel: 2.76 cm2
AV Area VTI: 2.49 cm2
AV Area mean vel: 2.64 cm2
AV Mean grad: 8 mmHg
AV Peak grad: 14.1 mmHg
Ao pk vel: 1.88 m/s
Area-P 1/2: 2.83 cm2
Height: 72 in
MV VTI: 2.24 cm2
S' Lateral: 2.7 cm
Weight: 6105.86 oz

## 2022-12-03 LAB — CBC
HCT: 34.6 % — ABNORMAL LOW (ref 39.0–52.0)
Hemoglobin: 10.7 g/dL — ABNORMAL LOW (ref 13.0–17.0)
MCH: 30.6 pg (ref 26.0–34.0)
MCHC: 30.9 g/dL (ref 30.0–36.0)
MCV: 98.9 fL (ref 80.0–100.0)
Platelets: 146 10*3/uL — ABNORMAL LOW (ref 150–400)
RBC: 3.5 MIL/uL — ABNORMAL LOW (ref 4.22–5.81)
RDW: 14 % (ref 11.5–15.5)
WBC: 17.2 10*3/uL — ABNORMAL HIGH (ref 4.0–10.5)
nRBC: 0 % (ref 0.0–0.2)

## 2022-12-03 LAB — MAGNESIUM: Magnesium: 2.3 mg/dL (ref 1.7–2.4)

## 2022-12-03 LAB — POTASSIUM: Potassium: 3.3 mmol/L — ABNORMAL LOW (ref 3.5–5.1)

## 2022-12-03 LAB — CK: Total CK: 250 U/L (ref 49–397)

## 2022-12-03 LAB — PROCALCITONIN: Procalcitonin: 0.88 ng/mL

## 2022-12-03 LAB — PHOSPHORUS: Phosphorus: 3.7 mg/dL (ref 2.5–4.6)

## 2022-12-03 MED ORDER — ALBUMIN HUMAN 25 % IV SOLN
25.0000 g | Freq: Four times a day (QID) | INTRAVENOUS | Status: AC
  Administered 2022-12-03 – 2022-12-04 (×4): 25 g via INTRAVENOUS
  Filled 2022-12-03 (×4): qty 100

## 2022-12-03 MED ORDER — FUROSEMIDE 10 MG/ML IJ SOLN
60.0000 mg | Freq: Three times a day (TID) | INTRAMUSCULAR | Status: DC
  Administered 2022-12-03 – 2022-12-05 (×6): 60 mg via INTRAVENOUS
  Filled 2022-12-03 (×6): qty 6

## 2022-12-03 MED ORDER — TRAMADOL HCL 50 MG PO TABS
50.0000 mg | ORAL_TABLET | Freq: Three times a day (TID) | ORAL | Status: DC | PRN
  Administered 2022-12-03 – 2022-12-08 (×3): 50 mg via ORAL
  Filled 2022-12-03 (×3): qty 1

## 2022-12-03 MED ORDER — POTASSIUM CHLORIDE 10 MEQ/50ML IV SOLN
10.0000 meq | INTRAVENOUS | Status: AC
  Administered 2022-12-03 (×4): 10 meq via INTRAVENOUS
  Filled 2022-12-03 (×3): qty 50

## 2022-12-03 MED ORDER — POTASSIUM CHLORIDE CRYS ER 20 MEQ PO TBCR
20.0000 meq | EXTENDED_RELEASE_TABLET | ORAL | Status: AC
  Administered 2022-12-03 (×2): 20 meq via ORAL
  Filled 2022-12-03 (×2): qty 1

## 2022-12-03 MED ORDER — POTASSIUM CHLORIDE CRYS ER 20 MEQ PO TBCR
40.0000 meq | EXTENDED_RELEASE_TABLET | Freq: Once | ORAL | Status: AC
  Administered 2022-12-03: 40 meq via ORAL

## 2022-12-03 MED ORDER — PERFLUTREN LIPID MICROSPHERE
1.0000 mL | INTRAVENOUS | Status: AC | PRN
  Administered 2022-12-03: 8 mL via INTRAVENOUS

## 2022-12-03 MED ORDER — HYDROMORPHONE HCL 1 MG/ML IJ SOLN
0.2500 mg | INTRAMUSCULAR | Status: DC | PRN
  Administered 2022-12-03 (×2): 0.25 mg via INTRAVENOUS
  Filled 2022-12-03 (×3): qty 0.5

## 2022-12-03 NOTE — Progress Notes (Signed)
Placed patient on bipap for the night with IPAP set at 16vm and EPAP set at 8cm. Oxygen set at 4lpm.

## 2022-12-03 NOTE — Progress Notes (Addendum)
*  PRELIMINARY RESULTS* Echocardiogram 2D Echocardiogram has been performed with Definity. Nurse gave Definity thru picc line.  Stacey Drain 12/03/2022, 5:01 PM

## 2022-12-03 NOTE — Progress Notes (Signed)
[facility] ADULT ICU REPLACEMENT PROTOCOL   The patient does apply for the [facility] Adult ICU Electrolyte Replacment Protocol based on the criteria listed below:   1.Exclusion criteria: TCTS, ECMO, Dialysis, and Myasthenia Gravis patients 2. Is GFR >/= 30 ml/min? Yes.    Patient's GFR today is 45 3. Is SCr </= 2? Yes.   Patient's SCr is 1.58 mg/dL 4. Did SCr increase >/= 0.5 in 24 hours? No. 5.Pt's weight >40kg  Yes.   6. Abnormal electrolyte(s): K+ 3.2  7. Electrolytes replaced per protocol 8.  Call MD STAT for K+ </= 2.5, Phos </= 1, or Mag </= 1 Physician:  Dr Gaye Pollack 12/03/2022 4:40 AM

## 2022-12-03 NOTE — Consult Note (Signed)
WOC Nurse Consult Note: Reason for Consult:Deep tissue injury to left buttocks.  Intact maroon discoloration.  Patient had fall at home with prolonged down period but this area is noted at 10/2022 MD visit.  Bilateral legs with erythema and warmth but patient states this is his baseline.  Wound type: pressure and bilateral lymphedema lower legs.  Pressure Injury POA: Yes Measurement:Left buttock  intact maroon discoloration:  noted in flowsheet as 2.5 x 0.75 cm  Bilateral lower legs with erythema and warmth, minimal edema.  Wound bed: intact maroon  Drainage (amount, consistency, odor)   lower legs weeping  Periwound: edema, erythema, chronic to lower legs. Dressing procedure/placement/frequency: Cleanse bilateral lower legs with soap and water.  Apply Xeroform to large areas of erythema/weeping.  Cover with kerlix from below toes to below knee and secure with ace wrap.  Barrier cream to buttocks each shift and offload pressure as possible.  Will not follow at this time.  Please re-consult if needed.  Mike Gip MSN, RN, FNP-BC CWON Wound, Ostomy, Continence Nurse Outpatient [facility] [phone] Pager [phone]

## 2022-12-03 NOTE — Progress Notes (Signed)
NAME:  [name], MRN:  [id number], DOB:  Feb 10, 1946, LOS: 1 ADMISSION DATE:  12/02/2022, CONSULTATION DATE:  12/02/2022 REFERRING MD:  Dr. Rhunette Croft - EDP, CHIEF COMPLAINT:  Sepsis/hypotension   History of Present Illness:  [name] is a 77 year old male extensive past medical history significant for but not limited to diastolic CHF prior CVA, A-fib anticoagulated with Eliquis but inconsistent with dosing HTN, HLD, sleep apnea, paroxysmal atrial fibrillation, asthma, and anemia who presented to the ED via EMS after fall.  Patient reports attempted to ambulate to the restroom this morning at 3 AM at which time his legs gave out on him resulting in fall, patient states he was unable to stand and remained on the floor until EMS was called and assisted with transport to the hospital.  Reports bilateral shooting pain down both legs that radiates up his back as well  On ED arrival patient was seen bradycardic and hypotensive with mild temperature of 100.5.  Lab work significant for K3.4, chloride 95, creatinine 1.48, BUN 30, BNP 771, lactic 2.6 cleared to 1.5, WBC 22.0.  Sepsis workup started.  PCCM called for further management and workup.  Pertinent  Medical History  diastolic CHF prior CVA, A-fib anticoagulated with Eliquis but inconsistent with dosing HTN, HLD, sleep apnea, paroxysmal atrial fibrillation, asthma, and anemia  Significant Hospital Events: Including procedures, antibiotic start and stop dates in addition to other pertinent events   8/31 presented after fall with lower extremity weakness and pain, hypotensive in ED prompting PCCM consult 9/1 on CPAP this a.m. never required vasopressor support  Interim History / Subjective:  Awakes easily on CPAP, denies any complaints  Objective   Blood pressure (!) 115/97, pulse 62, temperature 100.3 F (37.9 C), temperature source Axillary, resp. rate 20, height 6' (1.829 m), weight (!) 173.1 kg, SpO2 99%. CVP:  [18 mmHg] 18 mmHg   PEEP:  [12 cmH20] 12 cmH20   Intake/Output Summary (Last 24 hours) at 12/03/2022 0814 Last data filed at 12/03/2022 4098 Gross per 24 hour  Intake 2631.21 ml  Output 900 ml  Net 1731.21 ml   Filed Weights   12/02/22 0843 12/02/22 1800  Weight: (!) 158.8 kg (!) 173.1 kg    Examination: General: Acute on chronic ill-appearing elderly male lying in bed on CPAP no acute distress HEENT: C/AT, MM pink/moist, PERRL,  Neuro: Alert and oriented x 3, nonfocal CV: s1s2 regular rate and rhythm, no murmur, rubs, or gallops,  PULM: Laterally, no increased work of breathing, no added breath sounds, on CPAP GI: soft, bowel sounds active in all 4 quadrants, non-tender, non-distended Extremities: warm/dry, no edema  Skin: no rashes or lesions  Resolved Hospital Problem list   Shock  Assessment & Plan:  Bilateral lower extremity cellulitis -Patient was seen with bradycardia, hypotensive, leukocytosis, and positive lactic acid on admission.  Lactic acid quickly cleared with fluids and he required vasopressor support History of lymphedema P: Continue IV ceftriaxone with plan to transition to p.o. Augmentin soon Follow cultures Encourage oral hydration Monitor urine output  Diastolic CHF  -Most recent echocardiogram January 2023 EF of 60 to 65% with LV hypertrophy, RV function within normal, moderately elevated pulmonary artery systolic pressure A-fib anticoagulated with Eliquis but inconsistent with dosing  Hypertension Hyperlipidemia P: Repeat echo pending Strict intake and output Daily weight Continuous telemetry  Acute Kidney Injury  -Creatinine 1.48 with GFR 49 on admission, creatinine WNL 10/2022 P: Follow renal function Monitor urine output Trend Bment Avoid nephrotoxins Ensure  adequate renal perfusion  Hypokalemia P: Supplement as needed Trend Bment  Elevated CK P: Trend   Best Practice (right click and "Reselect all SmartList Selections" daily)   Diet/type: Regular  consistency (see orders) DVT prophylaxis:  GI prophylaxis: PPI Lines: N/A Foley:  N/A Code Status:  full code Last date of multidisciplinary goals of care discussion Pending  Critical care time: NA  Amarionna Arca D. Harris, NP-C [facility] Personal contact information can be found on Amion  If no contact or response made please call [phone] 12/03/2022, 8:14 AM

## 2022-12-03 NOTE — Plan of Care (Signed)
  Problem: Education: Goal: Knowledge of General Education information will improve Description Including pain rating scale, medication(s)/side effects and non-pharmacologic comfort measures Outcome: Progressing   Problem: Activity: Goal: Risk for activity intolerance will decrease Outcome: Progressing   Problem: Elimination: Goal: Will not experience complications related to bowel motility Outcome: Progressing   Problem: Safety: Goal: Ability to remain free from injury will improve Outcome: Progressing   

## 2022-12-03 NOTE — Progress Notes (Signed)
eLink Physician-Brief Progress Note Patient Name: [name] DOB: 03/14/1946 MRN: [id number]   Date of Service  12/03/2022  HPI/Events of Note  Pt complaining of biltaeral arm pain, 8/10 severity.  SBP currently 110s, off NE.   eICU Interventions  Pt with reported allergy to morphine.  Placed order for dilaudid 0.25mg  IV.  Will follow response        Zaylin Runco M DELA CRUZ 12/03/2022, 3:31 AM

## 2022-12-03 NOTE — Progress Notes (Signed)
eLink Physician-Brief Progress Note Patient Name: [name] DOB: [birth year] MRN: [id number]   Date of Service  12/03/2022  HPI/Events of Note  Reports chronic sciatica/nerve pain Able to take PO  eICU Interventions  Restarted home PRN tramadol   12:16 AM Tyelnol for low grade temp. Patient does have hx of nausea with acetaminophen. Will monitor fever/WBC curve   Intervention Category Intermediate Interventions: Pain - evaluation and management  Soloman Mckeithan Mechele Collin 12/03/2022, 8:25 PM

## 2022-12-04 DIAGNOSIS — A419 Sepsis, unspecified organism: Secondary | ICD-10-CM | POA: Diagnosis not present

## 2022-12-04 DIAGNOSIS — R6521 Severe sepsis with septic shock: Secondary | ICD-10-CM | POA: Diagnosis not present

## 2022-12-04 LAB — CBC
HCT: 32.6 % — ABNORMAL LOW (ref 39.0–52.0)
Hemoglobin: 10.2 g/dL — ABNORMAL LOW (ref 13.0–17.0)
MCH: 30 pg (ref 26.0–34.0)
MCHC: 31.3 g/dL (ref 30.0–36.0)
MCV: 95.9 fL (ref 80.0–100.0)
Platelets: 138 10*3/uL — ABNORMAL LOW (ref 150–400)
RBC: 3.4 MIL/uL — ABNORMAL LOW (ref 4.22–5.81)
RDW: 14 % (ref 11.5–15.5)
WBC: 12.7 10*3/uL — ABNORMAL HIGH (ref 4.0–10.5)
nRBC: 0 % (ref 0.0–0.2)

## 2022-12-04 LAB — BASIC METABOLIC PANEL
Anion gap: 11 (ref 5–15)
BUN: 37 mg/dL — ABNORMAL HIGH (ref 8–23)
CO2: 31 mmol/L (ref 22–32)
Calcium: 8.1 mg/dL — ABNORMAL LOW (ref 8.9–10.3)
Chloride: 97 mmol/L — ABNORMAL LOW (ref 98–111)
Creatinine, Ser: 1.6 mg/dL — ABNORMAL HIGH (ref 0.61–1.24)
GFR, Estimated: 44 mL/min — ABNORMAL LOW (ref >60)
Glucose, Bld: 114 mg/dL — ABNORMAL HIGH (ref 70–99)
Potassium: 3.3 mmol/L — ABNORMAL LOW (ref 3.5–5.1)
Sodium: 139 mmol/L (ref 135–145)

## 2022-12-04 LAB — PHOSPHORUS: Phosphorus: 3.4 mg/dL (ref 2.5–4.6)

## 2022-12-04 LAB — MAGNESIUM: Magnesium: 2.3 mg/dL (ref 1.7–2.4)

## 2022-12-04 MED ORDER — METOPROLOL TARTRATE 12.5 MG HALF TABLET
12.5000 mg | ORAL_TABLET | Freq: Two times a day (BID) | ORAL | Status: DC
  Administered 2022-12-04 – 2022-12-08 (×8): 12.5 mg via ORAL
  Filled 2022-12-04 (×9): qty 1

## 2022-12-04 MED ORDER — POTASSIUM CHLORIDE CRYS ER 20 MEQ PO TBCR
20.0000 meq | EXTENDED_RELEASE_TABLET | ORAL | Status: AC
  Administered 2022-12-04 (×2): 20 meq via ORAL
  Filled 2022-12-04 (×2): qty 1

## 2022-12-04 MED ORDER — CEPHALEXIN 500 MG PO CAPS
500.0000 mg | ORAL_CAPSULE | Freq: Two times a day (BID) | ORAL | Status: AC
  Administered 2022-12-04 – 2022-12-06 (×6): 500 mg via ORAL
  Filled 2022-12-04 (×7): qty 1

## 2022-12-04 MED ORDER — POTASSIUM CHLORIDE 10 MEQ/50ML IV SOLN
10.0000 meq | INTRAVENOUS | Status: AC
  Administered 2022-12-04 (×4): 10 meq via INTRAVENOUS
  Filled 2022-12-04 (×2): qty 50

## 2022-12-04 MED ORDER — ACETAMINOPHEN 325 MG PO TABS
650.0000 mg | ORAL_TABLET | Freq: Four times a day (QID) | ORAL | Status: AC | PRN
  Administered 2022-12-04: 650 mg via ORAL
  Filled 2022-12-04: qty 2

## 2022-12-04 MED ORDER — FLECAINIDE ACETATE 50 MG PO TABS
50.0000 mg | ORAL_TABLET | Freq: Two times a day (BID) | ORAL | Status: DC
  Administered 2022-12-04 – 2022-12-08 (×9): 50 mg via ORAL
  Filled 2022-12-04 (×10): qty 1

## 2022-12-04 NOTE — Progress Notes (Signed)
eLink Physician-Brief Progress Note Patient Name: [name] DOB: 21-Jul-1945 MRN: [id number]   Date of Service  12/04/2022  HPI/Events of Note  Previously in NSR Now in atrial fib with HR 120s HDS  eICU Interventions  Continue to monitor     Intervention Category Intermediate Interventions: Arrhythmia - evaluation and management  Arjen Deringer Mechele Collin 12/04/2022, 6:54 AM

## 2022-12-04 NOTE — Plan of Care (Signed)
  Problem: Pain Managment: Goal: General experience of comfort will improve Outcome: Progressing   Problem: Safety: Goal: Ability to remain free from injury will improve Outcome: Progressing   Problem: Skin Integrity: Goal: Risk for impaired skin integrity will decrease Outcome: Progressing   

## 2022-12-04 NOTE — Progress Notes (Signed)
   12/04/22 2130  BiPAP/CPAP/SIPAP  BiPAP/CPAP/SIPAP Pt Type Adult  BiPAP/CPAP/SIPAP Resmed  Mask Type Full face mask  Mask Size Medium  IPAP 16 cmH20  EPAP 8 cmH2O  Flow Rate 3 lpm  Minute Ventilation 12.8  Leak 2  Tidal Volume (Vt) 539  Patient Home Equipment No  Auto Titrate No  CPAP/SIPAP surface wiped down Yes  BiPAP/CPAP /SiPAP Vitals  Pulse Rate 60  Resp (!) 23  SpO2 100 %  MEWS Score/Color  MEWS Score 1  MEWS Score Color [place] Si

## 2022-12-04 NOTE — Progress Notes (Signed)
[facility] ADULT ICU REPLACEMENT PROTOCOL   The patient does apply for the [facility] Adult ICU Electrolyte Replacment Protocol based on the criteria listed below:   1.Exclusion criteria: TCTS, ECMO, Dialysis, and Myasthenia Gravis patients 2. Is GFR >/= 30 ml/min? Yes.    Patient's GFR today is 44 3. Is SCr </= 2? Yes.   Patient's SCr is 1.60 mg/dL 4. Did SCr increase >/= 0.5 in 24 hours? No. 5.Pt's weight >40kg  Yes.   6. Abnormal electrolyte(s): K  7. Electrolytes replaced per protocol 8.  Call MD STAT for K+ </= 2.5, Phos </= 1, or Mag </= 1 Physician:  Arnette Schaumann [facility] 12/04/2022 5:59 AM

## 2022-12-04 NOTE — Plan of Care (Signed)
Continuing plan of care

## 2022-12-04 NOTE — Progress Notes (Addendum)
NAME:  [name], MRN:  [id number], DOB:  [birth year], LOS: 2 ADMISSION DATE:  12/02/2022, CONSULTATION DATE:  12/02/2022 REFERRING MD:  Dr. Rhunette Croft - EDP, CHIEF COMPLAINT:  Sepsis/hypotension   History of Present Illness:   [name] is a 77 year old male extensive past medical history significant for but not limited to diastolic CHF prior CVA, A-fib anticoagulated with Eliquis but inconsistent with dosing HTN, HLD, sleep apnea, paroxysmal atrial fibrillation, asthma, and anemia who presented to the ED via EMS after fall.  Patient reports attempted to ambulate to the restroom this morning at 3 AM at which time his legs gave out on him resulting in fall, patient states he was unable to stand and remained on the floor until EMS was called and assisted with transport to the hospital.  Reports bilateral shooting pain down both legs that radiates up his back as well  On ED arrival patient was seen bradycardic and hypotensive with mild temperature of 100.5.  Lab work significant for K3.4, chloride 95, creatinine 1.48, BUN 30, BNP 771, lactic 2.6 cleared to 1.5, WBC 22.0.  Sepsis workup started.  PCCM called for further management and workup.  Pertinent  Medical History  diastolic CHF prior CVA, A-fib anticoagulated with Eliquis but inconsistent with dosing HTN, HLD, sleep apnea, paroxysmal atrial fibrillation, asthma, and anemia  Significant Hospital Events: Including procedures, antibiotic start and stop dates in addition to other pertinent events   8/31 presented after fall with lower extremity weakness and pain, hypotensive in ED prompting PCCM consult 9/1 on CPAP this a.m. never required vasopressor support  Interim History / Subjective:   BP has remained stable.  Not on pressors. Eating breakfast  Objective   Blood pressure 121/72, pulse (!) 116, temperature 98.6 F (37 C), temperature source Oral, resp. rate 18, height 6' (1.829 m), weight (!) 173.1 kg, SpO2 100%.         Intake/Output Summary (Last 24 hours) at 12/04/2022 0802 Last data filed at 12/04/2022 0735 Gross per 24 hour  Intake 826 ml  Output 4600 ml  Net -3774 ml   Filed Weights   12/02/22 0843 12/02/22 1800  Weight: (!) 158.8 kg (!) 173.1 kg    Examination: Blood pressure 121/72, pulse (!) 116, temperature 98.6 F (37 C), temperature source Oral, resp. rate 18, height 6' (1.829 m), weight (!) 173.1 kg, SpO2 100%. Gen:      No acute distress HEENT:  EOMI, sclera anicteric Neck:     No masses; no thyromegaly Lungs:    Clear to auscultation bilaterally; normal respiratory effort CV:         Regular rate and rhythm; no murmurs Abd:      + bowel sounds; soft, non-tender; no palpable masses, no distension Ext:    LE with chronic erythema and edema Skin:      Warm and dry; no rash Neuro: alert and oriented x 3 Psych: normal mood and affect   Lab/imaging reviewed Significant for potassium 3.3, creatinine 1.60 WBC improved to 12.7, hemoglobin 10.2 Platelets 138  Resolved Hospital Problem list   Shock  Assessment & Plan:  Bilateral lower extremity cellulitis -Patient was seen with bradycardia, hypotensive, leukocytosis, and positive lactic acid on admission.  Lactic acid quickly cleared with fluids and he required vasopressor support History of lymphedema P: Continue IV ceftriaxone Follow cultures Encourage oral hydration Monitor urine output  Diastolic CHF  -Most recent echocardiogram January 2023 EF of 60 to 65% with LV hypertrophy, RV function within  normal, moderately elevated pulmonary artery systolic pressure A-fib anticoagulated with Eliquis but inconsistent with dosing  Hypertension Hyperlipidemia P: Echo reviewed Strict intake and output Daily weight Continuous telemetry. Continue eliquis Start flecainide and po lopressor at lowe dose  Acute Kidney Injury  -Creatinine 1.48 with GFR 49 on admission, creatinine WNL 10/2022 P: Follow renal function Monitor urine  output Trend Bment Avoid nephrotoxins Ensure adequate renal perfusion  Hypokalemia P: Supplement as needed Trend Bment  Elevated CK P: Trend   Stable for transfer out of ICU  Best Practice (right click and "Reselect all SmartList Selections" daily)   Diet/type: Regular consistency (see orders) DVT prophylaxis:  GI prophylaxis: PPI Lines: N/A Foley:  N/A Code Status:  full code Last date of multidisciplinary goals of care discussion Pending  Critical care time: NA   Chilton Greathouse MD [facility] See Amion for pager  If no response to pager , please call [phone] until 7pm After 7:00 pm call Elink  [phone] 12/04/2022, 8:56 AM

## 2022-12-05 DIAGNOSIS — A419 Sepsis, unspecified organism: Secondary | ICD-10-CM | POA: Diagnosis not present

## 2022-12-05 DIAGNOSIS — I509 Heart failure, unspecified: Secondary | ICD-10-CM

## 2022-12-05 DIAGNOSIS — R6520 Severe sepsis without septic shock: Secondary | ICD-10-CM

## 2022-12-05 DIAGNOSIS — N179 Acute kidney failure, unspecified: Secondary | ICD-10-CM

## 2022-12-05 DIAGNOSIS — R6521 Severe sepsis with septic shock: Secondary | ICD-10-CM | POA: Diagnosis not present

## 2022-12-05 LAB — BASIC METABOLIC PANEL
Anion gap: 13 (ref 5–15)
BUN: 29 mg/dL — ABNORMAL HIGH (ref 8–23)
CO2: 34 mmol/L — ABNORMAL HIGH (ref 22–32)
Calcium: 8.3 mg/dL — ABNORMAL LOW (ref 8.9–10.3)
Chloride: 96 mmol/L — ABNORMAL LOW (ref 98–111)
Creatinine, Ser: 1.26 mg/dL — ABNORMAL HIGH (ref 0.61–1.24)
GFR, Estimated: 59 mL/min — ABNORMAL LOW (ref >60)
Glucose, Bld: 109 mg/dL — ABNORMAL HIGH (ref 70–99)
Potassium: 3.7 mmol/L (ref 3.5–5.1)
Sodium: 143 mmol/L (ref 135–145)

## 2022-12-05 LAB — CBC
HCT: 34.7 % — ABNORMAL LOW (ref 39.0–52.0)
Hemoglobin: 10.8 g/dL — ABNORMAL LOW (ref 13.0–17.0)
MCH: 30.3 pg (ref 26.0–34.0)
MCHC: 31.1 g/dL (ref 30.0–36.0)
MCV: 97.2 fL (ref 80.0–100.0)
Platelets: 141 10*3/uL — ABNORMAL LOW (ref 150–400)
RBC: 3.57 MIL/uL — ABNORMAL LOW (ref 4.22–5.81)
RDW: 14.1 % (ref 11.5–15.5)
WBC: 13.6 10*3/uL — ABNORMAL HIGH (ref 4.0–10.5)
nRBC: 0 % (ref 0.0–0.2)

## 2022-12-05 LAB — PHOSPHORUS: Phosphorus: 3.4 mg/dL (ref 2.5–4.6)

## 2022-12-05 LAB — MAGNESIUM: Magnesium: 2.5 mg/dL — ABNORMAL HIGH (ref 1.7–2.4)

## 2022-12-05 MED ORDER — ROPINIROLE HCL 1 MG PO TABS
0.5000 mg | ORAL_TABLET | Freq: Three times a day (TID) | ORAL | Status: DC
  Administered 2022-12-05 – 2022-12-08 (×11): 0.5 mg via ORAL
  Filled 2022-12-05 (×11): qty 1

## 2022-12-05 MED ORDER — FUROSEMIDE 10 MG/ML IJ SOLN
80.0000 mg | Freq: Two times a day (BID) | INTRAMUSCULAR | Status: DC
  Administered 2022-12-05 – 2022-12-08 (×6): 80 mg via INTRAVENOUS
  Filled 2022-12-05 (×6): qty 8

## 2022-12-05 MED ORDER — FINASTERIDE 5 MG PO TABS
5.0000 mg | ORAL_TABLET | Freq: Every day | ORAL | Status: DC
  Administered 2022-12-05 – 2022-12-08 (×4): 5 mg via ORAL
  Filled 2022-12-05 (×4): qty 1

## 2022-12-05 MED ORDER — PANTOPRAZOLE SODIUM 40 MG PO TBEC
40.0000 mg | DELAYED_RELEASE_TABLET | Freq: Every day | ORAL | Status: DC
  Administered 2022-12-05 – 2022-12-08 (×4): 40 mg via ORAL
  Filled 2022-12-05 (×4): qty 1

## 2022-12-05 MED ORDER — POTASSIUM CHLORIDE CRYS ER 20 MEQ PO TBCR
40.0000 meq | EXTENDED_RELEASE_TABLET | Freq: Every day | ORAL | Status: DC
  Administered 2022-12-05 – 2022-12-06 (×2): 40 meq via ORAL
  Filled 2022-12-05 (×2): qty 2

## 2022-12-05 NOTE — Progress Notes (Signed)
Pt on [place] at this time tolerating well. BiPAP (resmed) on stby at bedside.

## 2022-12-05 NOTE — Progress Notes (Signed)
   12/05/22 0437  BiPAP/CPAP/SIPAP  $ Non-Invasive Home Ventilator  Subsequent  BiPAP/CPAP/SIPAP Pt Type Adult  BiPAP/CPAP/SIPAP Resmed  Mask Type Full face mask  Mask Size Medium  IPAP 16 cmH20  EPAP 8 cmH2O  Pressure Support 8 cmH20  Patient Home Equipment No  Auto Titrate No  Nasal massage performed Yes  CPAP/SIPAP surface wiped down Yes

## 2022-12-05 NOTE — Plan of Care (Signed)

## 2022-12-05 NOTE — Progress Notes (Signed)
Pt placed on BIPAP for night rest tolerating well. 

## 2022-12-05 NOTE — Discharge Instructions (Signed)

## 2022-12-05 NOTE — Evaluation (Signed)
Occupational Therapy Evaluation Patient Details Name: [name] MRN: [id number] DOB: 1945-05-05 Today's Date: 12/05/2022   History of Present Illness Pt is 77 yo male admitted on 12/02/22 with septic shock in setting of bil LE cellulitis.  Pt had fall prior to admission - all imaging negative for acute injury.  Pt with hx including but not limited to CVA, HF, afib, resp failure, OSA, and lymphedema.   Clinical Impression   [name] was evaluated s/p the above admission list. He needs intermittent assist with ADLs, walking in the home with a SPC, uses a electric scooter for community mobility and had had recent falls at baseline. Upon evaluation the pt was limited by generalized weakness, decreased activity tolerance, dizziness with standing activity, skin breakdown on R heel, back pain, impaired cognition and unsteady balance. Overall he needed min A for bed mobility, to stand from EOB with RW and to take pivotal steps to the recliner. Pt had complaints of dizzines after with SBP at 102. Due to the deficits listed below the pt also needs up to mod A for LB ADLs and set up A for UB ADLs in sitting. Pt will benefit from continued acute OT services and skilled inpatient follow up therapy, <3 hours/day.        If plan is discharge home, recommend the following: A lot of help with walking and/or transfers;A lot of help with bathing/dressing/bathroom;Assistance with cooking/housework;Direct supervision/assist for medications management;Assist for transportation;Direct supervision/assist for financial management;Help with stairs or ramp for entrance    Functional Status Assessment  Patient has had a recent decline in their functional status and demonstrates the ability to make significant improvements in function in a reasonable and predictable amount of time.  Equipment Recommendations  Other (comment) (defer)       Precautions / Restrictions Precautions Precautions: Fall Precaution Comments:  watch BP Restrictions Weight Bearing Restrictions: No      Mobility Bed Mobility Overal bed mobility: Needs Assistance Bed Mobility: Supine to Sit     Supine to sit: HOB elevated, Used rails, Min assist          Transfers Overall transfer level: Needs assistance Equipment used: Rolling walker (2 wheels) Transfers: Sit to/from Stand, Bed to chair/wheelchair/BSC Sit to Stand: From elevated surface, Min assist, +2 physical assistance     Step pivot transfers: Min assist, +2 safety/equipment     General transfer comment: From elevated bed with 2 attempts, increased time, and assist of 2 for safety.  Pt taking small steps to the chair with cues to stay on task.  Pt's fatigued and R heel hurting (see comments) so limited distance.      Balance Overall balance assessment: Needs assistance Sitting-balance support: No upper extremity supported Sitting balance-Leahy Scale: Good     Standing balance support: Bilateral upper extremity supported, Reliant on assistive device for balance Standing balance-Leahy Scale: Poor                             ADL either performed or assessed with clinical judgement   ADL Overall ADL's : Needs assistance/impaired Eating/Feeding: Independent;Sitting   Grooming: Set up;Sitting   Upper Body Bathing: Set up;Sitting   Lower Body Bathing: Moderate assistance;Sit to/from stand   Upper Body Dressing : Set up;Sitting   Lower Body Dressing: Moderate assistance;Sit to/from stand   Toilet Transfer: Minimal assistance;+2 for safety/equipment;Ambulation;BSC/3in1;Rolling walker (2 wheels)   Toileting- Clothing Manipulation and Hygiene: Moderate assistance;Sit to/from stand  Functional mobility during ADLs: Minimal assistance;+2 for safety/equipment;Rolling walker (2 wheels) General ADL Comments: limited by activity tolerance, BP, weakness     Vision Baseline Vision/History: 0 No visual deficits Vision Assessment?: No  apparent visual deficits     Perception Perception: Not tested       Praxis Praxis: Not tested       Pertinent Vitals/Pain Pain Assessment Pain Assessment: Faces Faces Pain Scale: Hurts little more Pain Location: "sciatic pain" Pain Descriptors / Indicators: Discomfort, Sharp Pain Intervention(s): Limited activity within patient's tolerance, Monitored during session     Extremity/Trunk Assessment Upper Extremity Assessment Upper Extremity Assessment: Generalized weakness   Lower Extremity Assessment Lower Extremity Assessment: Defer to PT evaluation RLE Deficits / Details: ROM WFL; MMT : at least 3/5 but not further tested due to pain LLE Deficits / Details: ROM WFL; MMT : at least 3/5 but not further tested due to pain   Cervical / Trunk Assessment Cervical / Trunk Assessment: Other exceptions Cervical / Trunk Exceptions: body habitus limitations   Communication Communication Communication: Hearing impairment   Cognition Arousal: Alert Behavior During Therapy: WFL for tasks assessed/performed Overall Cognitive Status: No family/caregiver present to determine baseline cognitive functioning                                 General Comments: Pt following basic commands and oriented, but tangential with answers to questions and decreased problem solving. Self-distracting thought and needs cues for attention and re-direction     General Comments  Notable wheeping on sheet from R heel prior to start of session. Once pt transferred to sitting EOB, there was blood on the floor. R sock removed with skin tear on heel noted. foam mepilex pad placed with clean sock. RN notified.            Home Living Family/patient expects to be discharged to:: Private residence Living Arrangements: Spouse/significant other Available Help at Discharge: Family;Available 24 hours/day Type of Home: House Home Access: Stairs to enter [employer] of Steps: 3 back no  rails; 7 with bil rails at front but reports they are blocked   Home Layout: One level           Bathroom Accessibility: Yes   Home Equipment: Cane - single point;Rollator (4 wheels);Electric scooter;Shower seat   Additional Comments: Handicap Zenaida Niece (scooter stays in [place]); On home O2 24/7      Prior Functioning/Environment Prior Level of Function : Needs assist             Mobility Comments: Walks holding furniture/walls and cane; household ambulator, motorized scooter community; mutliple falls ADLs Comments: Has assist with dressing, baths on his own        OT Problem List: Decreased strength;Decreased range of motion;Decreased activity tolerance;Impaired balance (sitting and/or standing);Decreased safety awareness;Decreased knowledge of use of DME or AE;Decreased knowledge of precautions      OT Treatment/Interventions: Self-care/ADL training;Therapeutic exercise;DME and/or AE instruction;Therapeutic activities;Patient/family education;Balance training    OT Goals(Current goals can be found in the care plan section) Acute Rehab OT Goals Patient Stated Goal: to get better OT Goal Formulation: With patient Time For Goal Achievement: 12/19/22 Potential to Achieve Goals: Good ADL Goals Pt Will Perform Grooming: standing;with supervision Pt Will Perform Lower Body Dressing: with contact guard assist;sit to/from stand Pt Will Transfer to Toilet: with supervision;ambulating Pt Will Perform Toileting - Clothing Manipulation and hygiene: with supervision;sitting/lateral leans Additional ADL Goal #  1: pt will complete at least 8 minutes of OOB activity to demonstrated increased tolerance for ADLs  OT Frequency: Min 1X/week    Co-evaluation PT/OT/SLP Co-Evaluation/Treatment: Yes Reason for Co-Treatment: Complexity of the patient's impairments (multi-system involvement);For patient/therapist safety;To address functional/ADL transfers   OT goals addressed during session: ADL's  and self-care      AM-PAC OT "6 Clicks" Daily Activity     Outcome Measure Help from another person eating meals?: None Help from another person taking care of personal grooming?: A Little Help from another person toileting, which includes using toliet, bedpan, or urinal?: A Lot Help from another person bathing (including washing, rinsing, drying)?: A Lot Help from another person to put on and taking off regular upper body clothing?: A Little Help from another person to put on and taking off regular lower body clothing?: A Lot 6 Click Score: 16   End of Session Equipment Utilized During Treatment: Gait belt;Rolling walker (2 wheels) Nurse Communication: Mobility status  Activity Tolerance: Patient tolerated treatment well Patient left: in chair;with call bell/phone within reach;with chair alarm set  OT Visit Diagnosis: Unsteadiness on feet (R26.81);Other abnormalities of gait and mobility (R26.89);Muscle weakness (generalized) (M62.81);Pain                Time: 1057-1130 OT Time Calculation (min): 33 min Charges:  OT General Charges $OT Visit: 1 Visit OT Evaluation $OT Eval Moderate Complexity: 1 Mod  Derenda Mis, OTR/L Acute Rehabilitation Services Office [phone] Secure Chat Communication Preferred   Donia Pounds 12/05/2022, 12:25 PM

## 2022-12-05 NOTE — TOC Initial Note (Signed)
Transition of Care [facility]) - Initial/Assessment Note    Patient Details  Name: [name] MRN: [id number] Date of Birth: 1945/05/04  Transition of Care [facility]) CM/SW Contact:    Mearl Latin, LCSW Phone Number: 12/05/2022, 4:41 PM  Clinical Narrative:                 CSW received consult for possible SNF placement at time of discharge. CSW spoke with patient and spouse at bedside. Patient expressed understanding of PT recommendation and would like to talk to his son before deciding. He has a home Bipap machine and oxygen. CSW discussed insurance authorization process and will provide Medicare SNF ratings list. CSW will send out referrals for review and provide bed offers as available.   Skilled Nursing Rehab Facilities-   [email or web address]   Ratings out of 5 stars (5 the highest)   Name Address  Phone # Quality Care Staffing Health Inspection Overall  [facility] [address] [phone] [facility] [address] [place] [phone] 4 1 3 2   [facility] Nursing [address], [place] [phone] 2 1 1 1   [facility] [address], [place] [phone] 4 1 3 2   [facility]  [address], [place] [phone] 3 2 5 5   [facility] [address] [facility] [phone] 2 1 2 1   [facility] [address] [place] [phone] 4 1 2 1   [facility] [phone] [address] [place] [phone] 2 4 3 3   [facility] ([facility]) [address], [place] [phone] 3 2 2 2   [facility] [address] [place], [place] [phone] 1 2 1 1   [facility] ([place]) [address], [place] [phone] 3 1 1 1   [facility]igh[facility] [address] [place] [phone] 4 3 4 4   [facility] [address] [place] [phone] 3 4 3 3           [facility] [address], [place] [phone]      [facility]  ZJIRCVELFY, [place] [address], [place] [phone] 1 1 2 1   [facility] [address] [place] [phone] 2 2 4 4   [facility] [place] [address], [place] Ditto [phone] 2 1 4 3   [facility] [address], [place] [phone] 3 3 3 3           [address] (no [facility]) [address], [place] [phone] 4 4 5 5   [facility] (No [employer]) [address] [place], [place] [phone] 2 2 4 4   [facility] [address] [place] [phone] 2 1 2 1   [facility] (No [employer]) [address], [employer] [employer] [place] [phone] 5 5 5 5   [facility] [address] [facility] [phone] 2 3 5 5   [facility] [address] [place] [phone] 2 1 1 1   [facility] [address] [place] [phone] 5 2 5 5   [facility]  [address] [place] [phone] 2 2 2 2   [facility] [address] [place] [phone] 4 2 1 1   [facility] [address] [place], [place] [phone] 2 2 3 3           [facility] [address], [place] [phone] 1 1 1 1   [facility] [address], [place] Clines  [phone] 2 3 3 3   [facility] (No Humana) [address], [place] [phone] 2 1 3 2   [facility] (  [facility]) [address], [place] Levan [phone] 1 1 1 1   [facility] [address] [place] [phone] 3 2 5 5   [facility] [place] [address], [place] [phone] 2 1 1 1           [facility] [address] [place], [place] [phone] 4 4 5 5   [facility])  [address] [place] [phone] 2 1 2 1   [facility]) [address] [place] [phone]  1 4 3   [facility] [address] [place] [phone] 3 5 4 5   [address] [place], [place] [phone] 3 2 2 2   [facility]) [address] [place] [phone] 2 1 3 2        Barriers to Discharge: Continued Medical  Work up, SNF Pending bed offer   Patient Goals and CMS Choice Patient states their goals for this hospitalization and ongoing recovery are:: Rehab CMS Medicare.gov Compare Post Acute Care list provided to:: Patient Choice offered to / list presented to : Patient, Spouse [place] ownership interest in [facility].provided to:: Patient    Expected Discharge Plan and Services In-house Referral: Clinical Social Work     Living arrangements for the past 2 months: Single Family Home                                      Prior Living Arrangements/Services Living arrangements for the past 2 months: Single Family Home Lives with:: Spouse Patient language and need for interpreter reviewed:: No Do you feel safe going back to the place where you live?: Yes      Need for Family Participation in Patient Care: Yes (Comment) Care giver support system in place?: Yes (comment) Current home services: DME (Home Bipap and O2) Criminal Activity/Legal Involvement Pertinent to Current Situation/Hospitalization: No - Comment as needed  Activities of Daily Living      Permission Sought/Granted Permission sought to share information with : Facility Medical sales representative, Family Supports Permission granted to share information with : Yes, Verbal Permission Granted     Permission granted to share info w AGENCY: SNFs  Permission granted to share info w Relationship: Spouse     Emotional Assessment Appearance:: Appears older than stated age Attitude/Demeanor/Rapport: Engaged Affect (typically observed): Accepting Orientation: : Oriented to Place, Oriented to Self, Oriented to  Time, Oriented to Situation Alcohol / Substance Use: Not Applicable Psych Involvement: No (comment)  Admission diagnosis:  Lactic acidosis [E87.20] Generalized weakness [R53.1] Acute kidney injury (HCC) [N17.9] Septic shock (HCC) [A41.9, R65.21] Sepsis (HCC) [A41.9] Acute on chronic congestive heart  failure, unspecified heart failure type (HCC) [I50.9] Acute cough [R05.1] Sepsis with acute organ dysfunction without septic shock, due to unspecified organism, unspecified organ dysfunction type (HCC) [A41.9, R65.20] Patient Active Problem List   Diagnosis Date Noted   Septic shock (HCC) 12/02/2022   Diabetic ulcer of right midfoot associated with type 2 diabetes mellitus, limited to breakdown of skin (HCC) 02/07/2022   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   Anemia due to zinc deficiency 05/29/2021   Chronic heart failure with preserved ejection fraction (HCC) 05/28/2021   Chronic respiratory failure with hypoxia (HCC) 03/22/2021   Atherosclerosis of aorta (HCC) 11/10/2020   Insomnia 09/27/2020   Type II diabetes mellitus with manifestations (HCC) 11/14/2018   B12 deficiency 05/16/2018   Lumbosacral spondylosis  without myelopathy 12/18/2017   Vocal fold paralysis, 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   Benign essential HTN    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   OSA (obstructive sleep apnea)    Dysphagia, post-stroke    Neurologic gait disorder    Encounter for monitoring flecainide therapy 04/28/2016   Recurrent pulmonary emboli (HCC) 06/13/2015   GERD (gastroesophageal reflux disease) 06/13/2015   Basal cell carcinoma of skin 06/28/2014   Periodic limb movement sleep disorder 06/19/2014   PAF (paroxysmal atrial fibrillation) (HCC) 07/29/2013   Chronic anticoagulation 07/22/2013   Diastolic congestive heart failure (HCC) 07/16/2013   H/O laparoscopic adjustable gastric banding & hiatal hernia repair 04/08/13 04/23/2013   Morbid obesity (HCC) 08/09/2012   Peripheral neuropathy (HCC) 11/02/2009   Hyperlipidemia with target LDL less than 130 09/10/2008   GOUT 06/28/2006   BPH (benign prostatic hyperplasia) 06/28/2006   PCP:  [name], MD Pharmacy:   [id number] [place], [place] - [address] AT [facility] OF  [facility] & [address] [place] [address] Phone: [phone] Fax: [phone]     Social Determinants of Health (SDOH) Social History: SDOH Screenings   Food Insecurity: No Food Insecurity (04/25/2022)  Housing: Low Risk  (04/25/2022)  Transportation Needs: No Transportation Needs (04/25/2022)  Utilities: Not At Risk (04/25/2022)  Alcohol Screen: Low Risk  (12/16/2020)  Depression (PHQ2-9): Low Risk  (10/10/2022)  Financial Resource Strain: Low Risk  (12/16/2020)  Physical Activity: Inactive (04/25/2022)  Social Connections: Moderately Isolated (04/25/2022)  Stress: No Stress Concern Present (04/25/2022)  Tobacco Use: Medium Risk (12/02/2022)   SDOH Interventions:     Readmission Risk Interventions     No data to display

## 2022-12-05 NOTE — Progress Notes (Addendum)
PROGRESS NOTE        PATIENT DETAILS Name: [name] Age: 77 y.o. Sex: male Date of Birth: [birth year] Admit Date: 12/02/2022 Admitting Physician Lorin Glass, MD WUJ:[id number], Bernadene Bell, MD  Brief Summary: Patient is a 77 y.o.  male with history of CVA , HFpEF, atrial fibrillation, chronic hypoxic respiratory failure on home O2, OSA on CPAP, lymphedema follows with wound care clinic-presented to the hospital on 8/31 with weakness/fall-he was found to have hypotension-he was thought to have septic shock in the setting of bilateral lower extremity soft tissue infection-admitted to the ICU-stabilized and transferred to [facility] on 9/3  Significant events: 8/31>> septic shock in the setting of bilateral lower extremity cellulitis-admit to ICU 9/03>> transferred to [facility]  Significant studies: 8/31>> CXR: Vascular congestion 8/31>> CT head: No acute abnormality 8/31>> CT  C-spine: No fracture/dislocation 8/31>> CT L-spine: Multilevel degenerative disc disease 8/31>> CT angio chest: No PE 9/01>> renal ultrasound: No hydronephrosis 9/01>> echo: EF 60-65%  Significant microbiology data: 8/31>> blood culture: No growth 8/31>> COVID PCR: Negative  Procedures: None  Consults: PCCM  Subjective: Lying comfortably in bed-denies any chest pain or shortness of breath.  No major complaints.  Objective: Vitals: Blood pressure (!) 142/89, pulse (!) 57, temperature 98.1 F (36.7 C), temperature source Oral, resp. rate 18, height 6' (1.829 m), weight (!) 173.1 kg, SpO2 92%.   Exam: Gen Exam:Alert awake-not in any distress HEENT:atraumatic, normocephalic Chest: B/L clear to auscultation anteriorly CVS:S1S2 regular Abdomen:soft non tender, non distended Extremities: Both legs are wrapped Neurology: Non focal-but with generalized weakness Skin: no rash  Pertinent Labs/Radiology:    Latest Ref Rng & Units 12/05/2022    2:27 AM 12/04/2022    5:07 AM 12/03/2022     2:52 AM  CBC  WBC 4.0 - 10.5 K/uL 13.6  12.7  17.2   Hemoglobin 13.0 - 17.0 g/dL 47.8  29.5  62.1   Hematocrit 39.0 - 52.0 % 34.7  32.6  34.6   Platelets 150 - 400 K/uL 141  138  146     Lab Results  Component Value Date   NA 143 12/05/2022   K 3.7 12/05/2022   CL 96 (L) 12/05/2022   CO2 34 (H) 12/05/2022     Assessment/Plan: Septic shock due to bilateral lower extremity soft tissue infection superimposed on bilateral lower extremity lymphedema Sepsis physiology improved Cultures negative No longer on IV antibiotics has been has been transitioned to Keflex. Wound care per wound care RN  AKI Hemodynamically mediated Slowly improving Follow electrolytes  Acute on chronic HFpEF History of moderate PAH Volume overload Continue IV Lasix -7 L so far Strict intake/output/daily weights  PAF Telemetry monitoring Flecainide/metoprolol Eliquis  CVA No obvious focal deficits Eliquis/statin  Chronic hypoxic respiratory failure on 2 L of oxygen at home OSA on CPAP nightly  GERD PPI  BPH Finasteride  RLS Requip  Mood disorder Stable Zoloft  Ascending aortic dilatation-4.3 cm Radiology recommending annual CTA/MRA  Pressure Ulcer: Pressure Injury 12/02/22 Buttocks Left Deep Tissue Pressure Injury - Purple or maroon localized area of discolored intact skin or blood-filled blister due to damage of underlying soft tissue from pressure and/or shear. (Active)  12/02/22 1823  Location: Buttocks  Location Orientation: Left  Staging: Deep Tissue Pressure Injury - Purple or maroon localized area of discolored intact skin or blood-filled blister due  to damage of underlying soft tissue from pressure and/or shear.  Wound Description (Comments):   Present on Admission: Yes  Dressing Type Foam - Lift dressing to assess site every shift 12/05/22 0253    Morbid Obesity: Estimated body mass index is 51.76 kg/m as calculated from the following:   Height as of this encounter:  6' (1.829 m).   Weight as of this encounter: 173.1 kg.   Code status:   Code Status: Full Code   DVT Prophylaxis: SCDs Start: 12/02/22 1453 apixaban (ELIQUIS) tablet 5 mg     Family Communication: None at bedside   Disposition Plan: Status is: Inpatient Remains inpatient appropriate because: Severity of illness   Planned Discharge Destination:Home health versus SNF   Diet: Diet Order             Diet Heart Room service appropriate? Yes; Fluid consistency: Thin  Diet effective now                     Antimicrobial agents: Anti-infectives (From admission, onward)    Start     Dose/Rate Route Frequency Ordered Stop   12/04/22 1130  cephALEXin (KEFLEX) capsule 500 mg        500 mg Oral Every 12 hours 12/04/22 1035 12/06/22 2359   12/02/22 1100  cefTRIAXone (ROCEPHIN) 2 g in sodium chloride 0.9 % 100 mL IVPB  Status:  Discontinued        2 g 200 mL/hr over 30 Minutes Intravenous Every 24 hours 12/02/22 1049 12/04/22 1035   12/02/22 1100  vancomycin (VANCOREADY) IVPB 2000 mg/400 mL        2,000 mg 200 mL/hr over 120 Minutes Intravenous  Once 12/02/22 1100 12/02/22 1333        MEDICATIONS: Scheduled Meds:  apixaban  5 mg Oral BID   atorvastatin  80 mg Oral Daily   cephALEXin  500 mg Oral Q12H   Chlorhexidine Gluconate Cloth  6 each Topical Daily   flecainide  50 mg Oral Q12H   furosemide  60 mg Intravenous Q8H   metoprolol tartrate  12.5 mg Oral BID   sodium chloride flush  10-40 mL Intracatheter Q12H   Continuous Infusions:  sodium chloride Stopped (12/02/22 1445)   PRN Meds:.docusate sodium, HYDROmorphone (DILAUDID) injection, ipratropium-albuterol, polyethylene glycol, sodium chloride flush, traMADol   I have personally reviewed following labs and imaging studies  LABORATORY DATA: CBC: Recent Labs  Lab 12/02/22 0900 12/03/22 0252 12/04/22 0507 12/05/22 0227  WBC 22.0* 17.2* 12.7* 13.6*  HGB 11.9* 10.7* 10.2* 10.8*  HCT 39.1 34.6* 32.6*  34.7*  MCV 100.0 98.9 95.9 97.2  PLT 177 146* 138* 141*    Basic Metabolic Panel: Recent Labs  Lab 12/02/22 0900 12/03/22 0252 12/04/22 0507 12/05/22 0227  NA 139 138 139 143  K 3.4* 3.3*  3.2* 3.3* 3.7  CL 95* 95* 97* 96*  CO2 31 28 31  34*  GLUCOSE 112* 126* 114* 109*  BUN 30* 40* 37* 29*  CREATININE 1.48* 1.58* 1.60* 1.26*  CALCIUM 8.1* 8.1* 8.1* 8.3*  MG  --  2.3 2.3 2.5*  PHOS  --  3.7 3.4 3.4    GFR: Estimated Creatinine Clearance: 81.7 mL/min (A) (by C-G formula based on SCr of 1.26 mg/dL (H)).  Liver Function Tests: No results for input(s): "AST", "ALT", "ALKPHOS", "BILITOT", "PROT", "ALBUMIN" in the last 168 hours. No results for input(s): "LIPASE", "AMYLASE" in the last 168 hours. No results for input(s): "AMMONIA" in the last 168  hours.  Coagulation Profile: Recent Labs  Lab 12/02/22 0900  INR 1.6*    Cardiac Enzymes: Recent Labs  Lab 12/02/22 0900 12/03/22 1153  CKTOTAL 566* 250    BNP (last 3 results) No results for input(s): "PROBNP" in the last 8760 hours.  Lipid Profile: No results for input(s): "CHOL", "HDL", "LDLCALC", "TRIG", "CHOLHDL", "LDLDIRECT" in the last 72 hours.  Thyroid Function Tests: No results for input(s): "TSH", "T4TOTAL", "FREET4", "T3FREE", "THYROIDAB" in the last 72 hours.  Anemia Panel: No results for input(s): "VITAMINB12", "FOLATE", "FERRITIN", "TIBC", "IRON", "RETICCTPCT" in the last 72 hours.  Urine analysis:    Component Value Date/Time   COLORURINE YELLOW 12/02/2022 0850   APPEARANCEUR CLEAR 12/02/2022 0850   LABSPEC 1.009 12/02/2022 0850   PHURINE 5.0 12/02/2022 0850   GLUCOSEU >=500 (A) 12/02/2022 0850   GLUCOSEU 500 (A) 06/19/2022 1512   HGBUR NEGATIVE 12/02/2022 0850   BILIRUBINUR NEGATIVE 12/02/2022 0850   KETONESUR NEGATIVE 12/02/2022 0850   PROTEINUR NEGATIVE 12/02/2022 0850   UROBILINOGEN 1.0 06/19/2022 1512   NITRITE NEGATIVE 12/02/2022 0850   LEUKOCYTESUR NEGATIVE 12/02/2022 0850     Sepsis Labs: Lactic Acid, Venous    Component Value Date/Time   LATICACIDVEN 1.5 12/02/2022 1115    MICROBIOLOGY: Recent Results (from the past 240 hour(s))  SARS Coronavirus 2 by RT PCR (hospital order, performed in [facility] hospital lab) *cepheid single result test* Anterior Nasal Swab     Status: None   Collection Time: 12/02/22  8:50 AM   Specimen: Anterior Nasal Swab  Result Value Ref Range Status   SARS Coronavirus 2 by RT PCR NEGATIVE NEGATIVE Final    Comment: Performed at [facility], [address] [place], [place] [address]  Blood Culture (routine x 2)     Status: None (Preliminary result)   Collection Time: 12/02/22  8:55 AM   Specimen: BLOOD  Result Value Ref Range Status   Specimen Description BLOOD RIGHT ANTECUBITAL  Final   Special Requests   Final    BOTTLES DRAWN AEROBIC AND ANAEROBIC Blood Culture adequate volume   Culture   Final    NO GROWTH 3 DAYS Performed at [facility], [address], [place], [place] [address]    Report Status PENDING  Incomplete  Blood Culture (routine x 2)     Status: None (Preliminary result)   Collection Time: 12/02/22 10:53 AM   Specimen: BLOOD LEFT FOREARM  Result Value Ref Range Status   Specimen Description BLOOD LEFT FOREARM  Final   Special Requests   Final    BOTTLES DRAWN AEROBIC AND ANAEROBIC Blood Culture adequate volume   Culture   Final    NO GROWTH 3 DAYS Performed at [facility], [address], [place], [place] [address]    Report Status PENDING  Incomplete  MRSA Next Gen by PCR, Nasal     Status: None   Collection Time: 12/02/22 11:26 AM   Specimen: Nasal Mucosa; Nasal Swab  Result Value Ref Range Status   MRSA by PCR Next Gen NOT DETECTED NOT DETECTED Final    Comment: (NOTE) The GeneXpert MRSA Assay (FDA approved for NASAL specimens only), is one component of a comprehensive MRSA colonization surveillance program. It is not intended to diagnose MRSA infection nor to  guide or monitor treatment for MRSA infections. Test performance is not FDA approved in patients less than 45 years old. Performed at [facility], [address] [place], [place]  [address]     RADIOLOGY STUDIES/RESULTS: ECHOCARDIOGRAM COMPLETE  Result Date: 12/03/2022    ECHOCARDIOGRAM REPORT   Patient Name:   LEVIN PULLING [facility] Date of Exam: 12/03/2022 Medical Rec #:  [id number]         Height:       72.0 in Accession #:    [id number]        Weight:       381.6 lb Date of Birth:  12/22/1945         BSA:          2.804 m Patient Age:    76 years          BP:           115/97 mmHg Patient Gender: M                 HR:           62 bpm. Exam Location:  Inpatient Procedure: 2D Echo, Cardiac Doppler, Color Doppler and Intracardiac            Opacification Agent Indications:    CHF-Acute Diastolic I50.31  History:        Patient has prior history of Echocardiogram examinations, most                 recent 05/17/2021. CHF, Stroke, Arrythmias:Atrial Fibrillation;                 Risk Factors:Diabetes, Hypertension and Dyslipidemia. Morbid                 Obesity.  Sonographer:    Celesta Gentile RCS Referring Phys: [phone] WHITNEY D HARRIS IMPRESSIONS  1. Left ventricular ejection fraction, by estimation, is 60 to 65%. The left ventricle has normal function. The left ventricle has no regional wall motion abnormalities. There is mild concentric left ventricular hypertrophy. Left ventricular diastolic parameters are indeterminate.  2. Right ventricular systolic function is mildly reduced. The right ventricular size is moderately enlarged. There is mildly elevated pulmonary artery systolic pressure. The estimated right ventricular systolic pressure is 39.4 mmHg.  3. Left atrial size was moderately dilated.  4. Right atrial size was moderately dilated.  5. The mitral valve is normal in structure. No evidence of mitral valve regurgitation. No evidence of mitral stenosis.  6. The aortic valve is tricuspid. There is mild  calcification of the aortic valve. Aortic valve regurgitation is not visualized. Aortic valve sclerosis/calcification is present, without any evidence of aortic stenosis.  7. Aortic dilatation noted. There is mild dilatation of the aortic root, measuring 41 mm.  8. The inferior vena cava is dilated in size with <50% respiratory variability, suggesting right atrial pressure of 15 mmHg. FINDINGS  Left Ventricle: Left ventricular ejection fraction, by estimation, is 60 to 65%. The left ventricle has normal function. The left ventricle has no regional wall motion abnormalities. Definity contrast agent was given IV to delineate the left ventricular  endocardial borders. The left ventricular internal cavity size was normal in size. There is mild concentric left ventricular hypertrophy. Left ventricular diastolic parameters are indeterminate. Right Ventricle: The right ventricular size is moderately enlarged. No increase in right ventricular wall thickness. Right ventricular systolic function is mildly reduced. There is mildly elevated pulmonary artery systolic pressure. The tricuspid regurgitant velocity is 2.47 m/s, and with an assumed right atrial pressure of 15 mmHg, the estimated right ventricular systolic pressure is 39.4 mmHg. Left Atrium: Left atrial size was moderately dilated. Right Atrium: Right  atrial size was moderately dilated. Pericardium: There is no evidence of pericardial effusion. Mitral Valve: The mitral valve is normal in structure. No evidence of mitral valve regurgitation. No evidence of mitral valve stenosis. MV peak gradient, 8.1 mmHg. The mean mitral valve gradient is 2.0 mmHg. Tricuspid Valve: The tricuspid valve is normal in structure. Tricuspid valve regurgitation is mild . No evidence of tricuspid stenosis. Aortic Valve: The aortic valve is tricuspid. There is mild calcification of the aortic valve. Aortic valve regurgitation is not visualized. Aortic valve sclerosis/calcification is present,  without any evidence of aortic stenosis. Aortic valve mean gradient measures 8.0 mmHg. Aortic valve peak gradient measures 14.1 mmHg. Aortic valve area, by VTI measures 2.49 cm. Pulmonic Valve: The pulmonic valve was not well visualized. Pulmonic valve regurgitation is not visualized. No evidence of pulmonic stenosis. Aorta: Aortic dilatation noted. There is mild dilatation of the aortic root, measuring 41 mm. Venous: The inferior vena cava is dilated in size with less than 50% respiratory variability, suggesting right atrial pressure of 15 mmHg. IAS/Shunts: No atrial level shunt detected by color flow Doppler.  LEFT VENTRICLE PLAX 2D LVIDd:         5.50 cm   Diastology LVIDs:         2.70 cm   LV e' medial:    8.49 cm/s LV PW:         1.10 cm   LV E/e' medial:  15.9 LV IVS:        1.20 cm   LV e' lateral:   13.90 cm/s LVOT diam:     2.10 cm   LV E/e' lateral: 9.7 LV SV:         114 LV SV Index:   41 LVOT Area:     3.46 cm  RIGHT VENTRICLE RV S prime:     14.60 cm/s TAPSE (M-mode): 2.2 cm LEFT ATRIUM              Index        RIGHT ATRIUM           Index LA diam:        5.40 cm  1.93 cm/m   RA Area:     20.90 cm LA Vol (A2C):   112.0 ml 39.95 ml/m  RA Volume:   63.20 ml  22.54 ml/m LA Vol (A4C):   126.0 ml 44.94 ml/m LA Biplane Vol: 120.0 ml 42.80 ml/m  AORTIC VALVE AV Area (Vmax):    2.76 cm AV Area (Vmean):   2.64 cm AV Area (VTI):     2.49 cm AV Vmax:           188.00 cm/s AV Vmean:          134.000 cm/s AV VTI:            0.457 m AV Peak Grad:      14.1 mmHg AV Mean Grad:      8.0 mmHg LVOT Vmax:         150.00 cm/s LVOT Vmean:        102.000 cm/s LVOT VTI:          0.328 m LVOT/AV VTI ratio: 0.72  AORTA Ao Root diam: 4.10 cm MITRAL VALVE                TRICUSPID VALVE MV Area (PHT): 2.83 cm     TR Peak grad:   24.4 mmHg MV Area VTI:   2.24 cm  TR Vmax:        247.00 cm/s MV Peak grad:  8.1 mmHg MV Mean grad:  2.0 mmHg     SHUNTS MV Vmax:       1.42 m/s     Systemic VTI:  0.33 m MV Vmean:       56.5 cm/s    Systemic Diam: 2.10 cm MV Decel Time: 268 msec MV E velocity: 135.00 cm/s MV A velocity: 72.20 cm/s MV E/A ratio:  1.87 Arvilla Meres MD Electronically signed by Arvilla Meres MD Signature Date/Time: 12/03/2022/10:18:53 PM    Final    US RENAL  Result Date: 12/03/2022 CLINICAL DATA:  Acute renal injury/insufficiency EXAM: RENAL / URINARY TRACT ULTRASOUND COMPLETE COMPARISON:  CT 01/25/2021 FINDINGS: Right Kidney: Renal measurements: 10.9 x 6.3 x 6.8 cm = volume: 245 mL. 4.5 cm lateral interpolar right renal hypoechoic lesion likely corresponds to a cyst on 01/25/2021 CT. Mild renal cortical thinning, without hydronephrosis. Left Kidney: Renal measurements: 11.6 x 6.8 x 6.6 cm = volume: 272 mL. Mildly decreased echogenicity. Mild renal cortical thinning. No hydronephrosis. Bladder: Not visualized Other: Moderate  degraded by patient body habitus. IMPRESSION: Moderately degraded exam secondary to patient body habitus. No hydronephrosis or acute explanation for renal insufficiency. Electronically Signed   By: Jeronimo Greaves M.D.   On: 12/03/2022 17:22     LOS: 3 days   Jeoffrey Massed, MD  [facility]    To contact the attending provider between 7A-7P or the covering provider during after hours 7P-7A, please log into the web site [email or web address] and access using universal [place] password for that web site. If you do not have the password, please call the hospital operator.  12/05/2022, 8:48 AM

## 2022-12-05 NOTE — Evaluation (Signed)
Physical Therapy Evaluation Patient Details Name: [name] MRN: [id number] DOB: [birth year] Today's Date: 12/05/2022  History of Present Illness  Pt is 77 yo male admitted on 12/02/22 with septic shock in setting of bil LE cellulitis.  Pt had fall prior to admission - all imaging negative for acute injury.  Pt with hx including but not limited to CVA, HF, afib, resp failure, OSA, and lymphedema.  Clinical Impression  Pt admitted with above diagnosis. At baseline, pt resides with wife.  He was a household ambulator with AD and hx of falls, pt required assist with ADLs.  Reports wife not able to provide strong physical assist.  He does have 3 steps to enter his home with no rail.  Today, pt moving better than expected after 3 days in hospital, but still requiring min A of 2, bed elevated, heavy reliance on rails, and only able to take a few steps to the chair.  Had difficulty getting orthostatic bp , but he did report he was lightheaded in standing but resolved in recliner (bp at that time 102/61).  Pt also found to have bleeding from wound on R heel , covered with Alleyvn and notified RN.  Pt currently with functional limitations due to the deficits listed below (see PT Problem List). Pt will benefit from acute skilled PT to increase their independence and safety with mobility to allow discharge.  Patient will benefit from continued inpatient follow up therapy, <3 hours/day at discharge but he reports skeptical of losing his house if Medicaid involved, notified pt TOC could provide further details.          If plan is discharge home, recommend the following: A lot of help with walking and/or transfers;A lot of help with bathing/dressing/bathroom   Can travel by private vehicle   No    Equipment Recommendations Rolling walker (2 wheels) (bariatric)  Recommendations for Other Services       Functional Status Assessment Patient has had a recent decline in their functional status and  demonstrates the ability to make significant improvements in function in a reasonable and predictable amount of time.     Precautions / Restrictions Precautions Precautions: Fall      Mobility  Bed Mobility Overal bed mobility: Needs Assistance Bed Mobility: Supine to Sit     Supine to sit: HOB elevated, Used rails, Min assist     General bed mobility comments: LIght min A to steady but increased time and heavy dependence on rails    Transfers Overall transfer level: Needs assistance Equipment used: Rolling walker (2 wheels) Transfers: Sit to/from Stand, Bed to chair/wheelchair/BSC Sit to Stand: From elevated surface, Min assist, +2 physical assistance   Step pivot transfers: Min assist, +2 safety/equipment       General transfer comment: From elevated bed with 2 attempts, increased time, and assist of 2 for safety.  Pt taking small steps to the chair with cues to stay on task.  Pt's fatigued and R heel hurting (see comments) so limited distance.    Ambulation/Gait Ambulation/Gait assistance: Min assist, +2 safety/equipment Gait Distance (Feet): 2 Feet Assistive device: Rolling walker (2 wheels) Gait Pattern/deviations: Step-to pattern       General Gait Details: small steps to chair only  Stairs            Wheelchair Mobility     Tilt Bed    Modified Rankin (Stroke Patients Only)       Balance Overall balance assessment: Needs assistance Sitting-balance support:  No upper extremity supported Sitting balance-Leahy Scale: Good     Standing balance support: Bilateral upper extremity supported, Reliant on assistive device for balance Standing balance-Leahy Scale: Poor                               Pertinent Vitals/Pain Pain Assessment Pain Assessment: Faces Faces Pain Scale: Hurts little more Pain Location: "sciatic pain" Pain Descriptors / Indicators: Discomfort, Sharp (intermittent) Pain Intervention(s): Repositioned, Limited  activity within patient's tolerance, Monitored during session    Home Living Family/patient expects to be discharged to:: Private residence Living Arrangements: Spouse/significant other Available Help at Discharge: Family;Available 24 hours/day (Family not able to provide physical assist) Type of Home: House Home Access: Stairs to enter   [employer] of Steps: 3 back no rails; 7 with bil rails at front but reports they are blocked   Home Layout: One level Home Equipment: Cane - single point;Rollator (4 wheels);Electric scooter;Shower seat Additional Comments: Handicap Zenaida Niece (scooter stays in [place]); On home O2 24/7    Prior Function Prior Level of Function : Needs assist             Mobility Comments: Walks holding furniture/walls and cane; household ambulator, motorized scooter community; mutliple falls ADLs Comments: Has assist with dressing, baths on his own     Extremity/Trunk Assessment   Upper Extremity Assessment Upper Extremity Assessment: Defer to OT evaluation    Lower Extremity Assessment Lower Extremity Assessment: LLE deficits/detail;RLE deficits/detail RLE Deficits / Details: ROM WFL; MMT : at least 3/5 but not further tested due to pain LLE Deficits / Details: ROM WFL; MMT : at least 3/5 but not further tested due to pain    Cervical / Trunk Assessment Cervical / Trunk Assessment: Other exceptions Cervical / Trunk Exceptions: body habitus limitations  Communication   Communication Communication: Hearing impairment  Cognition Arousal: Alert Behavior During Therapy: WFL for tasks assessed/performed Overall Cognitive Status: No family/caregiver present to determine baseline cognitive functioning                                 General Comments: Pt following basic commands and oriented, but tangential with answers to questions and decreased problem solving        General Comments General comments (skin integrity, edema, etc.):  Pt  reports possible need to have BM -found larger BSC to leave in pt's room but he did not have to use during session.    Upon sitting EOB, noted blood on floor.  Pt found to have bleedy from boggy R heel.  Covered with Alleyvn heel and notified Debarah Crape, RN.   Attempted to get orthostatic BP as pt admitted septic shock, in bed 3 days, and soft BP.  BP in supine was 128/61 (at 8:56).  Attempted to get again but not able to get accurate reading. No c/o lightheadedness sitting. Not able to get in standing with pt gripping RW and fatigue.   Once transferred to chair, reports he did feel lightheaded when standing. BP found to be 102/61.  Reports no more lightheadedness in sitting.  Notified RN of level of assist needed and low bp/lightheaded with transfer.    Exercises     Assessment/Plan    PT Assessment Patient needs continued PT services  PT Problem List Decreased strength;Cardiopulmonary status limiting activity;Pain;Decreased range of motion;Decreased activity tolerance;Decreased balance;Decreased mobility;Decreased cognition;Decreased safety awareness;Decreased knowledge  of use of DME       PT Treatment Interventions DME instruction;Therapeutic exercise;Gait training;Stair training;Functional mobility training;Therapeutic activities;Patient/family education;Balance training    PT Goals (Current goals can be found in the Care Plan section)  Acute Rehab PT Goals Patient Stated Goal: return home PT Goal Formulation: With patient Time For Goal Achievement: 12/19/22 Potential to Achieve Goals: Fair    Frequency Min 1X/week     Co-evaluation               AM-PAC PT "6 Clicks" Mobility  Outcome Measure Help needed turning from your back to your side while in a flat bed without using bedrails?: A Little Help needed moving from lying on your back to sitting on the side of a flat bed without using bedrails?: A Little Help needed moving to and from a bed to a chair (including a  wheelchair)?: A Little Help needed standing up from a chair using your arms (e.g., wheelchair or bedside chair)?: A Lot Help needed to walk in hospital room?: Total Help needed climbing 3-5 steps with a railing? : Total 6 Click Score: 13    End of Session Equipment Utilized During Treatment: Gait belt Activity Tolerance: Patient tolerated treatment well Patient left: with chair alarm set;in chair;with call bell/phone within reach Nurse Communication: Mobility status PT Visit Diagnosis: Other abnormalities of gait and mobility (R26.89);History of falling (Z91.81);Muscle weakness (generalized) (M62.81)    Time: 1050-1130 PT Time Calculation (min) (ACUTE ONLY): 40 min   Charges:   PT Evaluation $PT Eval Moderate Complexity: 1 Mod PT Treatments $Therapeutic Activity: 8-22 mins PT General Charges $$ ACUTE PT VISIT: 1 Visit         Anise Salvo, PT Acute Rehab [facility] [phone]   Rayetta Humphrey 12/05/2022, 12:01 PM

## 2022-12-06 DIAGNOSIS — A419 Sepsis, unspecified organism: Secondary | ICD-10-CM | POA: Diagnosis not present

## 2022-12-06 DIAGNOSIS — I509 Heart failure, unspecified: Secondary | ICD-10-CM | POA: Diagnosis not present

## 2022-12-06 DIAGNOSIS — N179 Acute kidney failure, unspecified: Secondary | ICD-10-CM | POA: Diagnosis not present

## 2022-12-06 DIAGNOSIS — R6521 Severe sepsis with septic shock: Secondary | ICD-10-CM | POA: Diagnosis not present

## 2022-12-06 LAB — CBC
HCT: 37.5 % — ABNORMAL LOW (ref 39.0–52.0)
Hemoglobin: 11.4 g/dL — ABNORMAL LOW (ref 13.0–17.0)
MCH: 30.4 pg (ref 26.0–34.0)
MCHC: 30.4 g/dL (ref 30.0–36.0)
MCV: 100 fL (ref 80.0–100.0)
Platelets: 173 10*3/uL (ref 150–400)
RBC: 3.75 MIL/uL — ABNORMAL LOW (ref 4.22–5.81)
RDW: 13.7 % (ref 11.5–15.5)
WBC: 13.7 10*3/uL — ABNORMAL HIGH (ref 4.0–10.5)
nRBC: 0 % (ref 0.0–0.2)

## 2022-12-06 LAB — BASIC METABOLIC PANEL
Anion gap: 9 (ref 5–15)
BUN: 27 mg/dL — ABNORMAL HIGH (ref 8–23)
CO2: 35 mmol/L — ABNORMAL HIGH (ref 22–32)
Calcium: 8.4 mg/dL — ABNORMAL LOW (ref 8.9–10.3)
Chloride: 96 mmol/L — ABNORMAL LOW (ref 98–111)
Creatinine, Ser: 1.12 mg/dL (ref 0.61–1.24)
GFR, Estimated: >60 mL/min (ref >60)
Glucose, Bld: 111 mg/dL — ABNORMAL HIGH (ref 70–99)
Potassium: 3.5 mmol/L (ref 3.5–5.1)
Sodium: 140 mmol/L (ref 135–145)

## 2022-12-06 MED ORDER — EMPAGLIFLOZIN 10 MG PO TABS
10.0000 mg | ORAL_TABLET | Freq: Every day | ORAL | Status: DC
  Administered 2022-12-06 – 2022-12-08 (×3): 10 mg via ORAL
  Filled 2022-12-06 (×3): qty 1

## 2022-12-06 NOTE — TOC Progression Note (Signed)
Transition of Care [facility]) - Progression Note    Patient Details  Name: [name] MRN: [id number] Date of Birth: 1946/02/24  Transition of [facility]) CM/SW Contact  Mearl Latin, LCSW Phone Number: 12/06/2022, 4:52 PM  Clinical Narrative:    CSW met with patient and provided SNF bed offers and ratings list. He had spouse on speaker phone and they reported they will review options and let CSW know decision.      Barriers to Discharge: Continued Medical Work up, SNF Pending bed offer  Expected Discharge Plan and Services In-house Referral: Clinical Social Work     Living arrangements for the past 2 months: Single Family Home                                       Social Determinants of Health (SDOH) Interventions SDOH Screenings   Food Insecurity: No Food Insecurity (04/25/2022)  Housing: Low Risk  (04/25/2022)  Transportation Needs: No Transportation Needs (04/25/2022)  Utilities: Not At Risk (04/25/2022)  Alcohol Screen: Low Risk  (12/16/2020)  Depression (PHQ2-9): Low Risk  (10/10/2022)  Financial Resource Strain: Low Risk  (12/16/2020)  Physical Activity: Inactive (04/25/2022)  Social Connections: Moderately Isolated (04/25/2022)  Stress: No Stress Concern Present (04/25/2022)  Tobacco Use: Medium Risk (12/02/2022)    Readmission Risk Interventions     No data to display

## 2022-12-06 NOTE — Progress Notes (Addendum)
PROGRESS NOTE        PATIENT DETAILS Name: [name] Age: 77 y.o. Sex: male Date of Birth: 1945/07/30 Admit Date: 12/02/2022 Admitting Physician Lorin Glass, MD XIP:JASNK, Bernadene Bell, MD  Brief Summary: Patient is a 77 y.o.  male with history of CVA , HFpEF, atrial fibrillation, chronic hypoxic respiratory failure on home O2, OSA on CPAP, lymphedema follows with wound care clinic-presented to the hospital on 8/31 with weakness/fall-he was found to have hypotension-he was thought to have septic shock in the setting of bilateral lower extremity soft tissue infection-admitted to the ICU-stabilized and transferred to [facility] on 9/3  Significant events: 8/31>> septic shock in the setting of bilateral lower extremity cellulitis-admit to ICU 9/03>> transferred to [facility]  Significant studies: 8/31>> CXR: Vascular congestion 8/31>> CT head: No acute abnormality 8/31>> CT  C-spine: No fracture/dislocation 8/31>> CT L-spine: Multilevel degenerative disc disease 8/31>> CT angio chest: No PE 9/01>> renal ultrasound: No hydronephrosis 9/01>> echo: EF 60-65%  Significant microbiology data: 8/31>> blood culture: No growth 8/31>> COVID PCR: Negative  Procedures: None  Consults: PCCM  Subjective: Sleeping comfortably in bed-breathing is better-no major issues overnight.  Spouse at bedside.  Objective: Vitals: Blood pressure (!) 120/91, pulse (!) 51, temperature 97.8 F (36.6 C), temperature source Axillary, resp. rate 17, height 6' (1.829 m), weight (!) 167.1 kg, SpO2 94%.   Exam: Gen Exam:Alert awake-not in any distress HEENT:atraumatic, normocephalic Chest: B/L clear to auscultation anteriorly CVS:S1S2 regular Abdomen:soft non tender, non distended Extremities: Both legs wrapped-do not see any major edema on exam Neurology: Non focal-but with generalized weakness Skin: no rash  Pertinent Labs/Radiology:    Latest Ref Rng & Units 12/06/2022    3:15 AM  12/05/2022    2:27 AM 12/04/2022    5:07 AM  CBC  WBC 4.0 - 10.5 K/uL 13.7  13.6  12.7   Hemoglobin 13.0 - 17.0 g/dL 53.9  76.7  34.1   Hematocrit 39.0 - 52.0 % 37.5  34.7  32.6   Platelets 150 - 400 K/uL 173  141  138     Lab Results  Component Value Date   NA 140 12/06/2022   K 3.5 12/06/2022   CL 96 (L) 12/06/2022   CO2 35 (H) 12/06/2022     Assessment/Plan: Septic shock due to bilateral lower extremity soft tissue infection superimposed on bilateral lower extremity lymphedema Sepsis physiology improved Cultures negative No longer on IV antibiotics has been has been transitioned to Keflex. Wound care per wound care RN  AKI Hemodynamically mediated Resolved-creatinine back to baseline Continue to follow electrolytes closely.  Acute on chronic HFpEF History of moderate PAH Volume status much improved  -8.9 L so far Continue IV Lasix for 1 additional day-before transitioning to oral diuretics Restart Jardiance Continue beta-blocker-restart losartan on 9/5 if BP/renal function stable Strict intake/output/daily weights  PAF Telemetry monitoring Flecainide/metoprolol Eliquis  CVA No obvious focal deficits Eliquis/statin  HTN Stable on metoprolol Restart losartan on 9/5 if renal function/BP stable  Chronic hypoxic respiratory failure on 2 L of oxygen at home OSA on CPAP nightly  GERD PPI  BPH Finasteride  RLS Requip  Mood disorder Stable Zoloft  Ascending aortic dilatation-4.3 cm Radiology recommending annual CTA/MRA  Debility/deconditioning Likely chronic but significantly worse due to acute illness PT/OT eval-SNF recommended-social worker following  Pressure Ulcer: Pressure Injury 12/02/22 Buttocks  Left Deep Tissue Pressure Injury - Purple or maroon localized area of discolored intact skin or blood-filled blister due to damage of underlying soft tissue from pressure and/or shear. (Active)  12/02/22 1823  Location: Buttocks  Location Orientation:  Left  Staging: Deep Tissue Pressure Injury - Purple or maroon localized area of discolored intact skin or blood-filled blister due to damage of underlying soft tissue from pressure and/or shear.  Wound Description (Comments):   Present on Admission: Yes  Dressing Type Compression wrap 12/06/22 0004    Morbid Obesity: Estimated body mass index is 49.96 kg/m as calculated from the following:   Height as of this encounter: 6' (1.829 m).   Weight as of this encounter: 167.1 kg.   Code status:   Code Status: Full Code   DVT Prophylaxis: SCDs Start: 12/02/22 1453 apixaban (ELIQUIS) tablet 5 mg     Family Communication: Spouse at bedside   Disposition Plan: Status is: Inpatient Remains inpatient appropriate because: Severity of illness   Planned Discharge Destination:SNF   Diet: Diet Order             Diet Heart Room service appropriate? Yes; Fluid consistency: Thin  Diet effective now                     Antimicrobial agents: Anti-infectives (From admission, onward)    Start     Dose/Rate Route Frequency Ordered Stop   12/04/22 1130  cephALEXin (KEFLEX) capsule 500 mg        500 mg Oral Every 12 hours 12/04/22 1035 12/06/22 2359   12/02/22 1100  cefTRIAXone (ROCEPHIN) 2 g in sodium chloride 0.9 % 100 mL IVPB  Status:  Discontinued        2 g 200 mL/hr over 30 Minutes Intravenous Every 24 hours 12/02/22 1049 12/04/22 1035   12/02/22 1100  vancomycin (VANCOREADY) IVPB 2000 mg/400 mL        2,000 mg 200 mL/hr over 120 Minutes Intravenous  Once 12/02/22 1100 12/02/22 1333        MEDICATIONS: Scheduled Meds:  apixaban  5 mg Oral BID   atorvastatin  80 mg Oral Daily   cephALEXin  500 mg Oral Q12H   Chlorhexidine Gluconate Cloth  6 each Topical Daily   finasteride  5 mg Oral Daily   flecainide  50 mg Oral Q12H   furosemide  80 mg Intravenous Q12H   metoprolol tartrate  12.5 mg Oral BID   pantoprazole  40 mg Oral Daily   potassium chloride SA  40 mEq Oral  Daily   rOPINIRole  0.5 mg Oral TID   sodium chloride flush  10-40 mL Intracatheter Q12H   Continuous Infusions:  sodium chloride Stopped (12/02/22 1445)   PRN Meds:.docusate sodium, HYDROmorphone (DILAUDID) injection, ipratropium-albuterol, polyethylene glycol, sodium chloride flush, traMADol   I have personally reviewed following labs and imaging studies  LABORATORY DATA: CBC: Recent Labs  Lab 12/02/22 0900 12/03/22 0252 12/04/22 0507 12/05/22 0227 12/06/22 0315  WBC 22.0* 17.2* 12.7* 13.6* 13.7*  HGB 11.9* 10.7* 10.2* 10.8* 11.4*  HCT 39.1 34.6* 32.6* 34.7* 37.5*  MCV 100.0 98.9 95.9 97.2 100.0  PLT 177 146* 138* 141* 173    Basic Metabolic Panel: Recent Labs  Lab 12/02/22 0900 12/03/22 0252 12/04/22 0507 12/05/22 0227 12/06/22 0315  NA 139 138 139 143 140  K 3.4* 3.3*  3.2* 3.3* 3.7 3.5  CL 95* 95* 97* 96* 96*  CO2 31 28 31  34* 35*  GLUCOSE  112* 126* 114* 109* 111*  BUN 30* 40* 37* 29* 27*  CREATININE 1.48* 1.58* 1.60* 1.26* 1.12  CALCIUM 8.1* 8.1* 8.1* 8.3* 8.4*  MG  --  2.3 2.3 2.5*  --   PHOS  --  3.7 3.4 3.4  --     GFR: Estimated Creatinine Clearance: 90 mL/min (by C-G formula based on SCr of 1.12 mg/dL).  Liver Function Tests: No results for input(s): "AST", "ALT", "ALKPHOS", "BILITOT", "PROT", "ALBUMIN" in the last 168 hours. No results for input(s): "LIPASE", "AMYLASE" in the last 168 hours. No results for input(s): "AMMONIA" in the last 168 hours.  Coagulation Profile: Recent Labs  Lab 12/02/22 0900  INR 1.6*    Cardiac Enzymes: Recent Labs  Lab 12/02/22 0900 12/03/22 1153  CKTOTAL 566* 250    BNP (last 3 results) No results for input(s): "PROBNP" in the last 8760 hours.  Lipid Profile: No results for input(s): "CHOL", "HDL", "LDLCALC", "TRIG", "CHOLHDL", "LDLDIRECT" in the last 72 hours.  Thyroid Function Tests: No results for input(s): "TSH", "T4TOTAL", "FREET4", "T3FREE", "THYROIDAB" in the last 72 hours.  Anemia  Panel: No results for input(s): "VITAMINB12", "FOLATE", "FERRITIN", "TIBC", "IRON", "RETICCTPCT" in the last 72 hours.  Urine analysis:    Component Value Date/Time   COLORURINE YELLOW 12/02/2022 0850   APPEARANCEUR CLEAR 12/02/2022 0850   LABSPEC 1.009 12/02/2022 0850   PHURINE 5.0 12/02/2022 0850   GLUCOSEU >=500 (A) 12/02/2022 0850   GLUCOSEU 500 (A) 06/19/2022 1512   HGBUR NEGATIVE 12/02/2022 0850   BILIRUBINUR NEGATIVE 12/02/2022 0850   KETONESUR NEGATIVE 12/02/2022 0850   PROTEINUR NEGATIVE 12/02/2022 0850   UROBILINOGEN 1.0 06/19/2022 1512   NITRITE NEGATIVE 12/02/2022 0850   LEUKOCYTESUR NEGATIVE 12/02/2022 0850    Sepsis Labs: Lactic Acid, Venous    Component Value Date/Time   LATICACIDVEN 1.5 12/02/2022 1115    MICROBIOLOGY: Recent Results (from the past 240 hour(s))  SARS Coronavirus 2 by RT PCR (hospital order, performed in [facility] hospital lab) *cepheid single result test* Anterior Nasal Swab     Status: None   Collection Time: 12/02/22  8:50 AM   Specimen: Anterior Nasal Swab  Result Value Ref Range Status   SARS Coronavirus 2 by RT PCR NEGATIVE NEGATIVE Final    Comment: Performed at [facility], [address] [place], [place] [address]  Blood Culture (routine x 2)     Status: None (Preliminary result)   Collection Time: 12/02/22  8:55 AM   Specimen: BLOOD  Result Value Ref Range Status   Specimen Description BLOOD RIGHT ANTECUBITAL  Final   Special Requests   Final    BOTTLES DRAWN AEROBIC AND ANAEROBIC Blood Culture adequate volume   Culture   Final    NO GROWTH 4 DAYS Performed at [facility], [address], [place], [place] [address]    Report Status PENDING  Incomplete  Blood Culture (routine x 2)     Status: None (Preliminary result)   Collection Time: 12/02/22 10:53 AM   Specimen: BLOOD LEFT FOREARM  Result Value Ref Range Status   Specimen Description BLOOD LEFT FOREARM  Final   Special Requests   Final    BOTTLES DRAWN  AEROBIC AND ANAEROBIC Blood Culture adequate volume   Culture   Final    NO GROWTH 4 DAYS Performed at [facility], [address], [place], [place] [address]    Report Status PENDING  Incomplete  MRSA Next Gen by PCR, Nasal  Status: None   Collection Time: 12/02/22 11:26 AM   Specimen: Nasal Mucosa; Nasal Swab  Result Value Ref Range Status   MRSA by PCR Next Gen NOT DETECTED NOT DETECTED Final    Comment: (NOTE) The GeneXpert MRSA Assay (FDA approved for NASAL specimens only), is one component of a comprehensive MRSA colonization surveillance program. It is not intended to diagnose MRSA infection nor to guide or monitor treatment for MRSA infections. Test performance is not FDA approved in patients less than 61 years old. Performed at [facility], [address], [place], [place] [address]     RADIOLOGY STUDIES/RESULTS: No results found.   LOS: 4 days   Jeoffrey Massed, MD  [facility]    To contact the attending provider between 7A-7P or the covering provider during after hours 7P-7A, please log into the web site [email or web address] and access using universal [place] password for that web site. If you do not have the password, please call the hospital operator.  12/06/2022, 9:57 AM

## 2022-12-06 NOTE — Plan of Care (Signed)
  Problem: Education: Goal: Knowledge of General Education information will improve Description: Including pain rating scale, medication(s)/side effects and non-pharmacologic comfort measures Outcome: Progressing   Problem: Health Behavior/Discharge Planning: Goal: Ability to manage health-related needs will improve Outcome: Progressing   Problem: Clinical Measurements: Goal: Ability to maintain clinical measurements within normal limits will improve Outcome: Progressing Goal: Will remain free from infection Outcome: Progressing Goal: Diagnostic test results will improve Outcome: Progressing Goal: Respiratory complications will improve Outcome: Progressing Goal: Cardiovascular complication will be avoided Outcome: Progressing   Problem: Activity: Goal: Risk for activity intolerance will decrease Outcome: Progressing   Problem: Nutrition: Goal: Adequate nutrition will be maintained Outcome: Progressing   Problem: Coping: Goal: Level of anxiety will decrease Outcome: Progressing   Problem: Elimination: Goal: Will not experience complications related to bowel motility Outcome: Progressing Goal: Will not experience complications related to urinary retention Outcome: Progressing   Problem: Pain Managment: Goal: General experience of comfort will improve Outcome: Progressing   Problem: Safety: Goal: Ability to remain free from injury will improve Outcome: Progressing   Problem: Skin Integrity: Goal: Risk for impaired skin integrity will decrease Outcome: Progressing   Problem: Education: Goal: Knowledge of General Education information will improve Description: Including pain rating scale, medication(s)/side effects and non-pharmacologic comfort measures Outcome: Progressing   Problem: Health Behavior/Discharge Planning: Goal: Ability to manage health-related needs will improve Outcome: Progressing   Problem: Clinical Measurements: Goal: Ability to maintain  clinical measurements within normal limits will improve Outcome: Progressing   Problem: Clinical Measurements: Goal: Will remain free from infection Outcome: Progressing   Problem: Clinical Measurements: Goal: Will remain free from infection Outcome: Progressing   Problem: Clinical Measurements: Goal: Diagnostic test results will improve Outcome: Progressing   Problem: Clinical Measurements: Goal: Respiratory complications will improve Outcome: Progressing   Problem: Clinical Measurements: Goal: Cardiovascular complication will be avoided Outcome: Progressing   Problem: Activity: Goal: Risk for activity intolerance will decrease Outcome: Progressing   Problem: Nutrition: Goal: Adequate nutrition will be maintained Outcome: Progressing   Problem: Nutrition: Goal: Adequate nutrition will be maintained Outcome: Progressing   Problem: Coping: Goal: Level of anxiety will decrease Outcome: Progressing   Problem: Elimination: Goal: Will not experience complications related to bowel motility Outcome: Progressing   Problem: Elimination: Goal: Will not experience complications related to urinary retention Outcome: Progressing   Problem: Pain Managment: Goal: General experience of comfort will improve Outcome: Progressing   Problem: Safety: Goal: Ability to remain free from injury will improve Outcome: Progressing   Problem: Skin Integrity: Goal: Risk for impaired skin integrity will decrease Outcome: Progressing

## 2022-12-06 NOTE — Progress Notes (Signed)
   12/06/22 2307  BiPAP/CPAP/SIPAP  $ Face Mask Large  Yes  BiPAP/CPAP/SIPAP Resmed  Mask Type Full face mask  Mask Size Large  EPAP 12 cmH2O  Patient Home Equipment No  Auto Titrate No

## 2022-12-06 NOTE — Plan of Care (Signed)

## 2022-12-06 NOTE — [place] FL2 (Signed)
[place] MEDICAID FL2 LEVEL OF CARE FORM     IDENTIFICATION  Patient Name: [name] Birthdate: 05/05/1945 Sex: male Admission Date (Current Location): 12/02/2022  [facility] and [place] Number:  Producer, television/film/video and Address:  [facility] [place], 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 2 diabetes mellitus, limited to breakdown of skin (HCC) 02/07/2022   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   Anemia due to zinc deficiency 05/29/2021   Chronic heart failure with preserved ejection fraction (HCC) 05/28/2021   Chronic respiratory failure with hypoxia (HCC) 03/22/2021   Atherosclerosis of aorta (HCC) 11/10/2020   Insomnia 09/27/2020   Type II diabetes mellitus with manifestations (HCC) 11/14/2018   B12 deficiency 05/16/2018   Lumbosacral spondylosis without myelopathy 12/18/2017   Vocal fold paralysis, 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   Benign essential HTN    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   OSA (obstructive sleep apnea)    Dysphagia, post-stroke    Neurologic gait disorder    Encounter for monitoring flecainide therapy 04/28/2016   Recurrent pulmonary emboli (HCC) 06/13/2015   GERD (gastroesophageal reflux disease) 06/13/2015   Basal cell carcinoma of skin 06/28/2014   Periodic limb movement sleep disorder 06/19/2014   PAF (paroxysmal atrial fibrillation) (HCC) 07/29/2013   Chronic anticoagulation 07/22/2013    Diastolic congestive heart failure (HCC) 07/16/2013   H/O laparoscopic adjustable gastric banding & hiatal hernia repair 04/08/13 04/23/2013   Morbid obesity (HCC) 08/09/2012   Peripheral neuropathy (HCC) 11/02/2009   Hyperlipidemia with target LDL less than 130 09/10/2008   GOUT 06/28/2006   BPH (benign prostatic hyperplasia) 06/28/2006    Orientation RESPIRATION BLADDER Height & Weight     Self, Time, Situation, Place  O2 (4L nasal cannula; has home cpap) Continent, External catheter Weight: (!) 368 lb 6.2 oz (167.1 kg) Height:  6' (182.9 cm)  BEHAVIORAL SYMPTOMS/MOOD NEUROLOGICAL BOWEL NUTRITION STATUS      Continent Diet (See dc summary)  AMBULATORY STATUS COMMUNICATION OF NEEDS Skin   Extensive Assist Verbally PU Stage and Appropriate Care, Other (Comment) (Deep tissue injury on buttocks; non pressure wound on thigh; wound on pretibial)                       Personal Care Assistance Level of Assistance  Bathing, Feeding, Dressing Bathing Assistance: Maximum assistance Feeding assistance: Independent Dressing Assistance: Limited assistance     Functional Limitations Info  Sight, Hearing Sight Info: Impaired Hearing Info: Impaired      SPECIAL CARE FACTORS FREQUENCY  PT (By licensed PT), OT (By licensed OT)     PT Frequency: 5x/week OT Frequency: 5x/week            Contractures Contractures Info: Not present    Additional Factors Info  Code Status, Allergies Code Status Info: Full Allergies Info: Morphine, Penicillins, Codeine, Propoxyphene N-acetaminophen           Current Medications (12/06/2022):  This is the current hospital active medication list Current Facility-Administered Medications  Medication Dose Route Frequency Provider Last Rate Last Admin   0.9 %  sodium chloride infusion  250 mL Intravenous Continuous Janeann Forehand D, NP   Held at 12/02/22 1445   apixaban (ELIQUIS) tablet 5 mg  5 mg Oral BID Janeann Forehand D, NP   5 mg at 12/06/22  0945   atorvastatin (LIPITOR) tablet 80 mg  80 mg Oral Daily Janeann Forehand D, NP   80 mg at 12/06/22 0945   cephALEXin (KEFLEX) capsule 500 mg  500 mg Oral Q12H Knute Neu, RPH   500 mg at 12/06/22 0945   Chlorhexidine Gluconate Cloth 2 % PADS 6 each  6 each Topical Daily Lorin Glass, MD   6 each at 12/06/22 0947   docusate sodium (COLACE) capsule 100 mg  100 mg Oral BID PRN Janeann Forehand D, NP       empagliflozin (JARDIANCE) tablet 10 mg  10 mg Oral Daily Ghimire, Shanker M, MD       finasteride (PROSCAR) tablet 5 mg  5 mg Oral Daily Maretta Bees, MD   5 mg at 12/06/22 0945   flecainide (TAMBOCOR) tablet 50 mg  50 mg Oral Q12H Mannam, Praveen, MD   50 mg at 12/06/22 0945   furosemide (LASIX) injection 80 mg  80 mg Intravenous Q12H Maretta Bees, MD   80 mg at 12/06/22 0530   HYDROmorphone (DILAUDID) injection 0.25 mg  0.25 mg Intravenous Q2H PRN Gaetana Michaelis, MD   0.25 mg at 12/03/22 1934   ipratropium-albuterol (DUONEB) 0.5-2.5 (3) MG/3ML nebulizer solution 3 mL  3 mL Nebulization Q4H PRN Lorin Glass, MD   3 mL at 12/03/22 1842   metoprolol tartrate (LOPRESSOR) tablet 12.5 mg  12.5 mg Oral BID Mannam, Praveen, MD   12.5 mg at 12/05/22 2204   pantoprazole (PROTONIX) EC tablet 40 mg  40 mg Oral Daily Maretta Bees, MD   40 mg at 12/06/22 0945   polyethylene glycol (MIRALAX / GLYCOLAX) packet 17 g  17 g Oral Daily PRN Janeann Forehand D, NP       potassium chloride SA (KLOR-CON M) CR tablet 40 mEq  40 mEq Oral Daily Maretta Bees, MD   40 mEq at 12/06/22 0945   rOPINIRole (REQUIP) tablet 0.5 mg  0.5 mg Oral TID Maretta Bees, MD   0.5 mg at 12/06/22 0945   sodium chloride flush (NS) 0.9 % injection 10-40 mL  10-40 mL Intracatheter Q12H Lorin Glass, MD   10 mL at 12/06/22 0947   sodium chloride flush (NS) 0.9 % injection 10-40 mL  10-40 mL Intracatheter PRN Lorin Glass, MD       traMADol Janean Sark) tablet 50 mg  50 mg Oral Q8H PRN Luciano Cutter,  MD   50 mg at 12/04/22 1100     Discharge Medications: Please see discharge summary for a list of discharge medications.  Relevant Imaging Results:  Relevant Lab Results:   Additional Information SSN: [id number] [place], LCSW

## 2022-12-06 NOTE — Plan of Care (Signed)

## 2022-12-06 NOTE — Care Management Important Message (Signed)
Important Message  Patient Details  Name: [name] MRN: [id number] Date of Birth: [birth year]   Medicare Important Message Given:  Yes     [name] Stefan Church 12/06/2022, 4:39 PM

## 2022-12-07 ENCOUNTER — Inpatient Hospital Stay (HOSPITAL_COMMUNITY): Payer: Medicare Other

## 2022-12-07 DIAGNOSIS — R6521 Severe sepsis with septic shock: Secondary | ICD-10-CM | POA: Diagnosis not present

## 2022-12-07 DIAGNOSIS — I509 Heart failure, unspecified: Secondary | ICD-10-CM | POA: Diagnosis not present

## 2022-12-07 DIAGNOSIS — N179 Acute kidney failure, unspecified: Secondary | ICD-10-CM | POA: Diagnosis not present

## 2022-12-07 DIAGNOSIS — A419 Sepsis, unspecified organism: Secondary | ICD-10-CM | POA: Diagnosis not present

## 2022-12-07 LAB — BASIC METABOLIC PANEL
Anion gap: 8 (ref 5–15)
BUN: 25 mg/dL — ABNORMAL HIGH (ref 8–23)
CO2: 37 mmol/L — ABNORMAL HIGH (ref 22–32)
Calcium: 8.6 mg/dL — ABNORMAL LOW (ref 8.9–10.3)
Chloride: 97 mmol/L — ABNORMAL LOW (ref 98–111)
Creatinine, Ser: 1.11 mg/dL (ref 0.61–1.24)
GFR, Estimated: >60 mL/min (ref >60)
Glucose, Bld: 112 mg/dL — ABNORMAL HIGH (ref 70–99)
Potassium: 3.3 mmol/L — ABNORMAL LOW (ref 3.5–5.1)
Sodium: 142 mmol/L (ref 135–145)

## 2022-12-07 LAB — CBC
HCT: 39 % (ref 39.0–52.0)
Hemoglobin: 12.2 g/dL — ABNORMAL LOW (ref 13.0–17.0)
MCH: 31 pg (ref 26.0–34.0)
MCHC: 31.3 g/dL (ref 30.0–36.0)
MCV: 99 fL (ref 80.0–100.0)
Platelets: 223 10*3/uL (ref 150–400)
RBC: 3.94 MIL/uL — ABNORMAL LOW (ref 4.22–5.81)
RDW: 13.6 % (ref 11.5–15.5)
WBC: 13.5 10*3/uL — ABNORMAL HIGH (ref 4.0–10.5)
nRBC: 0 % (ref 0.0–0.2)

## 2022-12-07 LAB — CULTURE, BLOOD (ROUTINE X 2)
Culture: NO GROWTH
Culture: NO GROWTH
Special Requests: ADEQUATE
Special Requests: ADEQUATE

## 2022-12-07 MED ORDER — POTASSIUM CHLORIDE CRYS ER 20 MEQ PO TBCR
40.0000 meq | EXTENDED_RELEASE_TABLET | Freq: Two times a day (BID) | ORAL | Status: DC
  Administered 2022-12-07 – 2022-12-08 (×3): 40 meq via ORAL
  Filled 2022-12-07 (×3): qty 2

## 2022-12-07 MED ORDER — LORAZEPAM 0.5 MG PO TABS
0.5000 mg | ORAL_TABLET | Freq: Two times a day (BID) | ORAL | Status: DC | PRN
  Administered 2022-12-07: 0.5 mg via ORAL
  Filled 2022-12-07: qty 1

## 2022-12-07 MED ORDER — SERTRALINE HCL 50 MG PO TABS
150.0000 mg | ORAL_TABLET | Freq: Every day | ORAL | Status: DC
  Administered 2022-12-07: 150 mg via ORAL
  Filled 2022-12-07: qty 1

## 2022-12-07 NOTE — Progress Notes (Signed)
PROGRESS NOTE        PATIENT DETAILS Name: [name] Age: 77 y.o. Sex: male Date of Birth: Mar 30, 1946 Admit Date: 12/02/2022 Admitting Physician Lorin Glass, MD YNW:GNFAO, Bernadene Bell, MD  Brief Summary: Patient is a 77 y.o.  male with history of CVA , HFpEF, atrial fibrillation, chronic hypoxic respiratory failure on home O2, OSA on CPAP, lymphedema follows with wound care clinic-presented to the hospital on 8/31 with weakness/fall-he was found to have hypotension-he was thought to have septic shock in the setting of bilateral lower extremity soft tissue infection-admitted to the ICU-stabilized and transferred to [facility] on 9/3  Significant events: 8/31>> septic shock in the setting of bilateral lower extremity cellulitis-admit to ICU 9/03>> [facility]  Significant studies: 8/31>> CXR: Vascular congestion 8/31>> CT head: No acute abnormality 8/31>> CT  C-spine: No fracture/dislocation 8/31>> CT L-spine: Multilevel degenerative disc disease 8/31>> CT angio chest: No PE 9/01>> renal ultrasound: No hydronephrosis 9/01>> echo: EF 60-65%  Significant microbiology data: 8/31>> blood culture: No growth 8/31>> COVID PCR: Negative  Procedures: None  Consults: PCCM  Subjective: VERY ANXIOUS-about numerous issues-including the fact that he could not work with physical therapy for the past several days due to wound/pain in his right leg, worried about going to SNF.  Objective: Vitals: Blood pressure (!) 140/81, pulse 61, temperature 98.2 F (36.8 C), temperature source Oral, resp. rate 15, height 6' (1.829 m), weight (!) 163.5 kg, SpO2 93%.   Exam: Gen Exam:Alert awake-not in any distress HEENT:atraumatic, normocephalic Chest: B/L clear to auscultation anteriorly CVS:S1S2 regular Abdomen:soft non tender, non distended Extremities:no edema Neurology: Non focal Skin: no rash  Pertinent Labs/Radiology:    Latest Ref Rng & Units 12/07/2022     4:02 AM 12/06/2022    3:15 AM 12/05/2022    2:27 AM  CBC  WBC 4.0 - 10.5 K/uL 13.5  13.7  13.6   Hemoglobin 13.0 - 17.0 g/dL 13.0  86.5  78.4   Hematocrit 39.0 - 52.0 % 39.0  37.5  34.7   Platelets 150 - 400 K/uL 223  173  141     Lab Results  Component Value Date   NA 142 12/07/2022   K 3.3 (L) 12/07/2022   CL 97 (L) 12/07/2022   CO2 37 (H) 12/07/2022     Assessment/Plan: Septic shock due to bilateral lower extremity soft tissue infection superimposed on bilateral lower extremity lymphedema Sepsis physiology improved Cultures negative Completed a course of antibiotics during this hospitalization Wound care per wound care RN  AKI Hemodynamically mediated Resolved-creatinine back to baseline Continue to follow electrolytes closely.  Acute on chronic HFpEF History of moderate PAH Volume status much improved-suspect close to euvolemia -12.4 L so far We will continue IV Lasix for 1 additional day Continue Jardiance Continue beta-blocker-restart losartan possibly on 9/6.  PAF Telemetry monitoring Flecainide/metoprolol Eliquis  CVA No obvious focal deficits Eliquis/statin  HTN Stable on metoprolol Restart losartan on 9/6 if renal function/BP stable  Chronic hypoxic respiratory failure on 2 L of oxygen at home OSA on CPAP nightly  GERD PPI  BPH Finasteride  RLS Requip  Mood disorder He anxious about discharge plans-going to SNF He wants to see if there is a way that he can talk with his primary psychiatrist-encouraged he could himself call the office.  He does not want to see on-call  psychiatrist. 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 (Active)  12/02/22 1823  Location: Buttocks  Location Orientation: Left  Staging: Deep Tissue Pressure Injury - Purple or maroon localized area of discolored intact skin or blood-filled blister due to damage of underlying soft tissue from pressure and/or shear.  Wound Description (Comments):   Present on Admission: Yes  Dressing Type Foam - Lift dressing to assess site every shift 12/07/22 0800    Morbid Obesity: Estimated body mass index is 48.89 kg/m as calculated from the following:   Height as of this encounter: 6' (1.829 m).   Weight as of this encounter: 163.5 kg.   Code status:   Code Status: Full Code   DVT Prophylaxis: SCDs Start: 12/02/22 1453 apixaban (ELIQUIS) tablet 5 mg     Family Communication: Spouse at bedside   Disposition Plan: Status is: Inpatient Remains inpatient appropriate because: Severity of illness   Planned Discharge Destination:SNF likely 9/6   Diet: Diet Order             Diet Heart Room service appropriate? Yes; Fluid consistency: Thin; Fluid restriction: 1500 mL Fluid  Diet effective now                     Antimicrobial agents: Anti-infectives (From admission, onward)    Start     Dose/Rate Route Frequency Ordered Stop   12/04/22 1130  cephALEXin (KEFLEX) capsule 500 mg        500 mg Oral Every 12 hours 12/04/22 1035 12/06/22 2206   12/02/22 1100  cefTRIAXone (ROCEPHIN) 2 g in sodium chloride 0.9 % 100 mL IVPB  Status:  Discontinued        2 g 200 mL/hr over 30 Minutes Intravenous Every 24 hours 12/02/22 1049 12/04/22 1035   12/02/22 1100  vancomycin (VANCOREADY) IVPB 2000 mg/400 mL        2,000 mg 200 mL/hr over 120 Minutes Intravenous  Once 12/02/22 1100 12/02/22 1333        MEDICATIONS: Scheduled Meds:  apixaban  5 mg Oral  BID   atorvastatin  80 mg Oral Daily   Chlorhexidine Gluconate Cloth  6 each Topical Daily   empagliflozin  10 mg Oral Daily   finasteride  5 mg Oral Daily   flecainide  50 mg Oral Q12H   furosemide  80 mg Intravenous Q12H   metoprolol tartrate  12.5 mg Oral BID   pantoprazole  40 mg Oral Daily   potassium chloride SA  40 mEq Oral BID   rOPINIRole  0.5 mg Oral TID   sertraline  150 mg Oral QHS   sodium chloride flush  10-40 mL Intracatheter Q12H   Continuous Infusions:  sodium chloride Stopped (12/02/22 1445)   PRN Meds:.docusate sodium, HYDROmorphone (DILAUDID) injection, ipratropium-albuterol, LORazepam, polyethylene glycol, sodium chloride flush, traMADol   I have personally reviewed following labs and imaging studies  LABORATORY DATA: CBC: Recent Labs  Lab 12/03/22 0252 12/04/22 0507 12/05/22 0227 12/06/22 0315 12/07/22 0402  WBC 17.2* 12.7* 13.6* 13.7* 13.5*  HGB 10.7* 10.2* 10.8* 11.4* 12.2*  HCT 34.6*  32.6* 34.7* 37.5* 39.0  MCV 98.9 95.9 97.2 100.0 99.0  PLT 146* 138* 141* 173 223    Basic Metabolic Panel: Recent Labs  Lab 12/03/22 0252 12/04/22 0507 12/05/22 0227 12/06/22 0315 12/07/22 0402  NA 138 139 143 140 142  K 3.3*  3.2* 3.3* 3.7 3.5 3.3*  CL 95* 97* 96* 96* 97*  CO2 28 31 34* 35* 37*  GLUCOSE 126* 114* 109* 111* 112*  BUN 40* 37* 29* 27* 25*  CREATININE 1.58* 1.60* 1.26* 1.12 1.11  CALCIUM 8.1* 8.1* 8.3* 8.4* 8.6*  MG 2.3 2.3 2.5*  --   --   PHOS 3.7 3.4 3.4  --   --     GFR: Estimated Creatinine Clearance: 89.7 mL/min (by C-G formula based on SCr of 1.11 mg/dL).  Liver Function Tests: No results for input(s): "AST", "ALT", "ALKPHOS", "BILITOT", "PROT", "ALBUMIN" in the last 168 hours. No results for input(s): "LIPASE", "AMYLASE" in the last 168 hours. No results for input(s): "AMMONIA" in the last 168 hours.  Coagulation Profile: Recent Labs  Lab 12/02/22 0900  INR 1.6*    Cardiac Enzymes: Recent Labs  Lab 12/02/22 0900  12/03/22 1153  CKTOTAL 566* 250    BNP (last 3 results) No results for input(s): "PROBNP" in the last 8760 hours.  Lipid Profile: No results for input(s): "CHOL", "HDL", "LDLCALC", "TRIG", "CHOLHDL", "LDLDIRECT" in the last 72 hours.  Thyroid Function Tests: No results for input(s): "TSH", "T4TOTAL", "FREET4", "T3FREE", "THYROIDAB" in the last 72 hours.  Anemia Panel: No results for input(s): "VITAMINB12", "FOLATE", "FERRITIN", "TIBC", "IRON", "RETICCTPCT" in the last 72 hours.  Urine analysis:    Component Value Date/Time   COLORURINE YELLOW 12/02/2022 0850   APPEARANCEUR CLEAR 12/02/2022 0850   LABSPEC 1.009 12/02/2022 0850   PHURINE 5.0 12/02/2022 0850   GLUCOSEU >=500 (A) 12/02/2022 0850   GLUCOSEU 500 (A) 06/19/2022 1512   HGBUR NEGATIVE 12/02/2022 0850   BILIRUBINUR NEGATIVE 12/02/2022 0850   KETONESUR NEGATIVE 12/02/2022 0850   PROTEINUR NEGATIVE 12/02/2022 0850   UROBILINOGEN 1.0 06/19/2022 1512   NITRITE NEGATIVE 12/02/2022 0850   LEUKOCYTESUR NEGATIVE 12/02/2022 0850    Sepsis Labs: Lactic Acid, Venous    Component Value Date/Time   LATICACIDVEN 1.5 12/02/2022 1115    MICROBIOLOGY: Recent Results (from the past 240 hour(s))  SARS Coronavirus 2 by RT PCR (hospital order, performed in [facility] hospital lab) *cepheid single result test* Anterior Nasal Swab     Status: None   Collection Time: 12/02/22  8:50 AM   Specimen: Anterior Nasal Swab  Result Value Ref Range Status   SARS Coronavirus 2 by RT PCR NEGATIVE NEGATIVE Final    Comment: Performed at [facility], [address], [place], [place] [address]  Blood Culture (routine x 2)     Status: None   Collection Time: 12/02/22  8:55 AM   Specimen: BLOOD  Result Value Ref Range Status   Specimen Description BLOOD RIGHT ANTECUBITAL  Final   Special Requests   Final    BOTTLES DRAWN AEROBIC AND ANAEROBIC Blood Culture adequate volume   Culture   Final    NO GROWTH 5 DAYS Performed at [facility], [address] [place], [place] [address]    Report Status 12/07/2022 FINAL  Final  Blood Culture (routine x 2)     Status: None   Collection Time: 12/02/22 10:53 AM   Specimen: BLOOD LEFT FOREARM  Result Value Ref Range Status  Specimen Description BLOOD LEFT FOREARM  Final   Special Requests   Final    BOTTLES DRAWN AEROBIC AND ANAEROBIC Blood Culture adequate volume   Culture   Final    NO GROWTH 5 DAYS Performed at [facility], [address] [place], [place] [address]    Report Status 12/07/2022 FINAL  Final  MRSA Next Gen by PCR, Nasal     Status: None   Collection Time: 12/02/22 11:26 AM   Specimen: Nasal Mucosa; Nasal Swab  Result Value Ref Range Status   MRSA by PCR Next Gen NOT DETECTED NOT DETECTED Final    Comment: (NOTE) The GeneXpert MRSA Assay (FDA approved for NASAL specimens only), is one component of a comprehensive MRSA colonization surveillance program. It is not intended to diagnose MRSA infection nor to guide or monitor treatment for MRSA infections. Test performance is not FDA approved in patients less than 23 years old. Performed at [facility], [address] [place], [place] [address]     RADIOLOGY STUDIES/RESULTS: DG Chest Port 1V same Day  Result Date: 12/07/2022 CLINICAL DATA:  Shortness of breath EXAM: PORTABLE CHEST 1 VIEW COMPARISON:  CXR 12/02/22 FINDINGS: Cardiomegaly. No pleural effusion. No pneumothorax. Hazy opacity in the right lung base could represent atelectasis or infection. No radiographically apparent displaced rib fractures. Visualized upper abdomen is unremarkable. IMPRESSION: 1. Hazy opacity at the right lung base could represent atelectasis or infection. 2.  Cardiomegaly. Electronically Signed   By: Lorenza Cambridge M.D.   On: 12/07/2022 11:17     LOS: 5 days   Jeoffrey Massed, MD  [facility]    To contact the attending provider between 7A-7P or the covering provider during after hours 7P-7A,  please log into the web site [email or web address] and access using universal [place] password for that web site. If you do not have the password, please call the hospital operator.  12/07/2022, 11:44 AM

## 2022-12-07 NOTE — Plan of Care (Signed)
  Problem: Education: Goal: Knowledge of General Education information will improve Description: Including pain rating scale, medication(s)/side effects and non-pharmacologic comfort measures Outcome: Progressing   Problem: Health Behavior/Discharge Planning: Goal: Ability to manage health-related needs will improve Outcome: Progressing   Problem: Clinical Measurements: Goal: Ability to maintain clinical measurements within normal limits will improve Outcome: Progressing Goal: Will remain free from infection Outcome: Progressing Goal: Diagnostic test results will improve Outcome: Progressing Goal: Respiratory complications will improve Outcome: Progressing Goal: Cardiovascular complication will be avoided Outcome: Progressing   Problem: Activity: Goal: Risk for activity intolerance will decrease Outcome: Progressing   Problem: Nutrition: Goal: Adequate nutrition will be maintained Outcome: Progressing   Problem: Coping: Goal: Level of anxiety will decrease Outcome: Progressing   Problem: Elimination: Goal: Will not experience complications related to bowel motility Outcome: Progressing Goal: Will not experience complications related to urinary retention Outcome: Progressing   Problem: Pain Managment: Goal: General experience of comfort will improve Outcome: Progressing   Problem: Safety: Goal: Ability to remain free from injury will improve Outcome: Progressing   Problem: Skin Integrity: Goal: Risk for impaired skin integrity will decrease Outcome: Progressing   Problem: Education: Goal: Knowledge of General Education information will improve Description: Including pain rating scale, medication(s)/side effects and non-pharmacologic comfort measures Outcome: Progressing   Problem: Health Behavior/Discharge Planning: Goal: Ability to manage health-related needs will improve Outcome: Progressing   Problem: Clinical Measurements: Goal: Will remain free from  infection Outcome: Progressing   Problem: Clinical Measurements: Goal: Diagnostic test results will improve Outcome: Progressing   Problem: Clinical Measurements: Goal: Respiratory complications will improve Outcome: Progressing   Problem: Clinical Measurements: Goal: Cardiovascular complication will be avoided Outcome: Progressing   Problem: Clinical Measurements: Goal: Respiratory complications will improve Outcome: Progressing   Problem: Activity: Goal: Risk for activity intolerance will decrease Outcome: Progressing   Problem: Coping: Goal: Level of anxiety will decrease Outcome: Progressing   Problem: Elimination: Goal: Will not experience complications related to bowel motility Outcome: Progressing   Problem: Pain Managment: Goal: General experience of comfort will improve Outcome: Progressing   Problem: Safety: Goal: Ability to remain free from injury will improve Outcome: Progressing   Problem: Skin Integrity: Goal: Risk for impaired skin integrity will decrease Outcome: Progressing

## 2022-12-07 NOTE — TOC Progression Note (Signed)
Transition of Care [facility]) - Progression Note    Patient Details  Name: KASHEEM MAZZARELLA MRN: [id number] Date of Birth: 29-Aug-1945  Transition of Care [facility]) CM/SW Contact  Mearl Latin, LCSW Phone Number: 12/07/2022, 2:35 PM  Clinical Narrative:    CSW received call from patient's spouse requesting [facility]  CSW confirmed [place] will have a [place] bed for patient tomorrow if stable. CSW confirmed that patient and spouse are ok with semi private room.      Barriers to Discharge: Continued Medical Work up  Expected Discharge Plan and Services In-house Referral: Clinical Social Work     Living arrangements for the past 2 months: Single Family Home                                       Social Determinants of Health (SDOH) Interventions SDOH Screenings   Food Insecurity: No Food Insecurity (04/25/2022)  Housing: Low Risk  (04/25/2022)  Transportation Needs: No Transportation Needs (04/25/2022)  Utilities: Not At Risk (04/25/2022)  Alcohol Screen: Low Risk  (12/16/2020)  Depression (PHQ2-9): Low Risk  (10/10/2022)  Financial Resource Strain: Low Risk  (12/16/2020)  Physical Activity: Inactive (04/25/2022)  Social Connections: Moderately Isolated (04/25/2022)  Stress: No Stress Concern Present (04/25/2022)  Tobacco Use: Medium Risk (12/02/2022)    Readmission Risk Interventions     No data to display

## 2022-12-07 NOTE — Progress Notes (Signed)
Physical Therapy Treatment Patient Details Name: [name] MRN: [id number] DOB: 1946-01-09 Today's Date: 12/07/2022   History of Present Illness Pt is 77 yo male admitted on 12/02/22 with septic shock in setting of bil LE cellulitis.  Pt had fall prior to admission - all imaging negative for acute injury.  Pt with hx including but not limited to CVA, HF, afib, resp failure, OSA, and lymphedema.    PT Comments  Patient eager to work with PT. At rest with incr RR and sats 89% on 4L. Up to sit at EOB with sats increassing to 96%, however during standing marching will gradually decr into mid 80s and then pt instructed to sit and perform pursed lip breathing with gradual recovery to 96%. Recommend chair follow for seated rest breaks as pt fatigues/desaturates fairly quickly.     If plan is discharge home, recommend the following: A lot of help with bathing/dressing/bathroom;Two people to help with walking and/or transfers;Help with stairs or ramp for entrance   Can travel by private vehicle     No  Equipment Recommendations  Rolling walker (2 wheels) (bariatric)    Recommendations for Other Services       Precautions / Restrictions Precautions Precautions: Fall Precaution Comments: watch BP Restrictions Weight Bearing Restrictions: No     Mobility  Bed Mobility Overal bed mobility: Needs Assistance Bed Mobility: Supine to Sit, Sit to Supine     Supine to sit: HOB elevated, Used rails, Contact guard Sit to supine: Supervision   General bed mobility comments: increased time and heavy dependence on rails up to EOB; use of momentum to lift legs up onto bed    Transfers Overall transfer level: Needs assistance Equipment used: Rolling walker (2 wheels) Transfers: Sit to/from Stand Sit to Stand: Min assist           General transfer comment: From bed at lowest height x 3 reps with cues for safest hand placement each time;    Ambulation/Gait     Assistive device:  Rolling walker (2 wheels)       Pre-gait activities: Standing marching at EOB; 3 sets for up to 30 sec each; good foot clearance; recommend chair follow and  to assist with O2/monitors     Stairs             Wheelchair Mobility     Tilt Bed    Modified Rankin (Stroke Patients Only)       Balance Overall balance assessment: Needs assistance Sitting-balance support: No upper extremity supported Sitting balance-Leahy Scale: Good     Standing balance support: Bilateral upper extremity supported, Reliant on assistive device for balance Standing balance-Leahy Scale: Poor                              Cognition Arousal: Alert Behavior During Therapy: WFL for tasks assessed/performed                                   General Comments: Pt following basic commands and oriented, but tangential with answers to questions and decreased problem solving. Self-distracting thought and needs cues for attention and re-direction        Exercises      General Comments General comments (skin integrity, edema, etc.): on 4L with sats dropping as low as 84% with activity; slow recovery with pursed lip breathing (partly because pt  begins talking before recovered)      Pertinent Vitals/Pain Pain Assessment Pain Assessment: Faces    Home Living                          Prior Function            PT Goals (current goals can now be found in the care plan section) Acute Rehab PT Goals Patient Stated Goal: return home Time For Goal Achievement: 12/19/22 Potential to Achieve Goals: Fair Progress towards PT goals: Progressing toward goals    Frequency    Min 1X/week      PT Plan      Co-evaluation              AM-PAC PT "6 Clicks" Mobility   Outcome Measure  Help needed turning from your back to your side while in a flat bed without using bedrails?: A Little Help needed moving from lying on your back to sitting on the side of  a flat bed without using bedrails?: A Little Help needed moving to and from a bed to a chair (including a wheelchair)?: A Little Help needed standing up from a chair using your arms (e.g., wheelchair or bedside chair)?: A Lot Help needed to walk in hospital room?: Total Help needed climbing 3-5 steps with a railing? : Total 6 Click Score: 13    End of Session Equipment Utilized During Treatment: Oxygen Activity Tolerance: Treatment limited secondary to medical complications (Comment) (dropping sats with standing/marching) Patient left: with call bell/phone within reach;in bed;with bed alarm set;with family/visitor present Nurse Communication: Mobility status PT Visit Diagnosis: Other abnormalities of gait and mobility (R26.89);History of falling (Z91.81);Muscle weakness (generalized) (M62.81)     Time: 4540-9811 PT Time Calculation (min) (ACUTE ONLY): 46 min  Charges:    $Gait Training: 38-52 mins PT General Charges $$ ACUTE PT VISIT: 1 Visit                      Jerolyn Center, PT Acute Rehabilitation Services  Office [phone] '   Zena Amos 12/07/2022, 3:02 PM

## 2022-12-08 DIAGNOSIS — E785 Hyperlipidemia, unspecified: Secondary | ICD-10-CM | POA: Diagnosis not present

## 2022-12-08 DIAGNOSIS — I2510 Atherosclerotic heart disease of native coronary artery without angina pectoris: Secondary | ICD-10-CM | POA: Diagnosis not present

## 2022-12-08 DIAGNOSIS — G4733 Obstructive sleep apnea (adult) (pediatric): Secondary | ICD-10-CM | POA: Diagnosis not present

## 2022-12-08 DIAGNOSIS — R279 Unspecified lack of coordination: Secondary | ICD-10-CM | POA: Diagnosis not present

## 2022-12-08 DIAGNOSIS — I4891 Unspecified atrial fibrillation: Secondary | ICD-10-CM | POA: Diagnosis not present

## 2022-12-08 DIAGNOSIS — G4700 Insomnia, unspecified: Secondary | ICD-10-CM | POA: Diagnosis not present

## 2022-12-08 DIAGNOSIS — E669 Obesity, unspecified: Secondary | ICD-10-CM | POA: Diagnosis not present

## 2022-12-08 DIAGNOSIS — F330 Major depressive disorder, recurrent, mild: Secondary | ICD-10-CM | POA: Diagnosis not present

## 2022-12-08 DIAGNOSIS — I2729 Other secondary pulmonary hypertension: Secondary | ICD-10-CM | POA: Diagnosis not present

## 2022-12-08 DIAGNOSIS — J9611 Chronic respiratory failure with hypoxia: Secondary | ICD-10-CM | POA: Diagnosis not present

## 2022-12-08 DIAGNOSIS — I5040 Unspecified combined systolic (congestive) and diastolic (congestive) heart failure: Secondary | ICD-10-CM | POA: Diagnosis not present

## 2022-12-08 DIAGNOSIS — K219 Gastro-esophageal reflux disease without esophagitis: Secondary | ICD-10-CM | POA: Diagnosis not present

## 2022-12-08 DIAGNOSIS — R6520 Severe sepsis without septic shock: Secondary | ICD-10-CM | POA: Diagnosis not present

## 2022-12-08 DIAGNOSIS — E119 Type 2 diabetes mellitus without complications: Secondary | ICD-10-CM | POA: Diagnosis not present

## 2022-12-08 DIAGNOSIS — J302 Other seasonal allergic rhinitis: Secondary | ICD-10-CM | POA: Diagnosis not present

## 2022-12-08 DIAGNOSIS — F32A Depression, unspecified: Secondary | ICD-10-CM | POA: Diagnosis not present

## 2022-12-08 DIAGNOSIS — R609 Edema, unspecified: Secondary | ICD-10-CM | POA: Diagnosis not present

## 2022-12-08 DIAGNOSIS — F4321 Adjustment disorder with depressed mood: Secondary | ICD-10-CM | POA: Diagnosis not present

## 2022-12-08 DIAGNOSIS — R41841 Cognitive communication deficit: Secondary | ICD-10-CM | POA: Diagnosis not present

## 2022-12-08 DIAGNOSIS — Z7401 Bed confinement status: Secondary | ICD-10-CM | POA: Diagnosis not present

## 2022-12-08 DIAGNOSIS — M6281 Muscle weakness (generalized): Secondary | ICD-10-CM | POA: Diagnosis not present

## 2022-12-08 DIAGNOSIS — J3801 Paralysis of vocal cords and larynx, unilateral: Secondary | ICD-10-CM | POA: Diagnosis not present

## 2022-12-08 DIAGNOSIS — A419 Sepsis, unspecified organism: Secondary | ICD-10-CM | POA: Diagnosis not present

## 2022-12-08 DIAGNOSIS — I5032 Chronic diastolic (congestive) heart failure: Secondary | ICD-10-CM | POA: Diagnosis not present

## 2022-12-08 DIAGNOSIS — I10 Essential (primary) hypertension: Secondary | ICD-10-CM | POA: Diagnosis not present

## 2022-12-08 DIAGNOSIS — N179 Acute kidney failure, unspecified: Secondary | ICD-10-CM | POA: Diagnosis not present

## 2022-12-08 DIAGNOSIS — E876 Hypokalemia: Secondary | ICD-10-CM | POA: Diagnosis not present

## 2022-12-08 DIAGNOSIS — R6521 Severe sepsis with septic shock: Secondary | ICD-10-CM | POA: Diagnosis not present

## 2022-12-08 DIAGNOSIS — I700 Atherosclerosis of aorta: Secondary | ICD-10-CM | POA: Diagnosis not present

## 2022-12-08 DIAGNOSIS — M47817 Spondylosis without myelopathy or radiculopathy, lumbosacral region: Secondary | ICD-10-CM | POA: Diagnosis not present

## 2022-12-08 DIAGNOSIS — N400 Enlarged prostate without lower urinary tract symptoms: Secondary | ICD-10-CM | POA: Diagnosis not present

## 2022-12-08 DIAGNOSIS — G2581 Restless legs syndrome: Secondary | ICD-10-CM | POA: Diagnosis not present

## 2022-12-08 DIAGNOSIS — R001 Bradycardia, unspecified: Secondary | ICD-10-CM | POA: Diagnosis not present

## 2022-12-08 DIAGNOSIS — I509 Heart failure, unspecified: Secondary | ICD-10-CM | POA: Diagnosis not present

## 2022-12-08 DIAGNOSIS — R2689 Other abnormalities of gait and mobility: Secondary | ICD-10-CM | POA: Diagnosis not present

## 2022-12-08 DIAGNOSIS — F419 Anxiety disorder, unspecified: Secondary | ICD-10-CM | POA: Diagnosis not present

## 2022-12-08 DIAGNOSIS — L97412 Non-pressure chronic ulcer of right heel and midfoot with fat layer exposed: Secondary | ICD-10-CM | POA: Diagnosis not present

## 2022-12-08 DIAGNOSIS — E118 Type 2 diabetes mellitus with unspecified complications: Secondary | ICD-10-CM | POA: Diagnosis not present

## 2022-12-08 LAB — BASIC METABOLIC PANEL
Anion gap: 12 (ref 5–15)
BUN: 26 mg/dL — ABNORMAL HIGH (ref 8–23)
CO2: 38 mmol/L — ABNORMAL HIGH (ref 22–32)
Calcium: 8.3 mg/dL — ABNORMAL LOW (ref 8.9–10.3)
Chloride: 95 mmol/L — ABNORMAL LOW (ref 98–111)
Creatinine, Ser: 1.12 mg/dL (ref 0.61–1.24)
GFR, Estimated: >60 mL/min (ref >60)
Glucose, Bld: 118 mg/dL — ABNORMAL HIGH (ref 70–99)
Potassium: 3.6 mmol/L (ref 3.5–5.1)
Sodium: 145 mmol/L (ref 135–145)

## 2022-12-08 LAB — MAGNESIUM: Magnesium: 2.6 mg/dL — ABNORMAL HIGH (ref 1.7–2.4)

## 2022-12-08 MED ORDER — LORAZEPAM 0.5 MG PO TABS: 0.5000 mg | ORAL_TABLET | Freq: Every day | ORAL | 0 refills | Status: DC

## 2022-12-08 MED ORDER — TRAMADOL HCL 50 MG PO TABS: ORAL_TABLET | ORAL | 5 refills | Status: DC

## 2022-12-08 NOTE — Progress Notes (Signed)
 PICC line removed per order and with no complications.  Pressure held to achieve hemostasis.  Vaseline/gauze/tegaderm applied.  Aftercare instructions reviewed with pt who verbalized understanding.

## 2022-12-08 NOTE — Discharge Summary (Addendum)
PATIENT DETAILS Name: MAURY DEININGER Age: 77 y.o. Sex: male Date of Birth: 1945-07-31 MRN: [id number]. Admitting Physician: Lorin Glass, MD VZD:GLOVF, Bernadene Bell, MD  Admit Date: 12/02/2022 Discharge date: 12/08/2022  Recommendations for Outpatient Follow-up:  Follow up with PCP in 1-2 weeks Please obtain CMP/CBC in one week annual CTA/MRA for dilatation of ascending aortic aorta  Admitted From:  Home  Disposition: SNF   Discharge Condition: good  CODE STATUS:   Code Status: Full Code   Diet recommendation:  Diet Order             Diet - low sodium heart healthy           Diet Heart Room service appropriate? Yes; Fluid consistency: Thin; Fluid restriction: 1500 mL Fluid  Diet effective now                    Brief Summary: Patient is a 77 y.o.  male with history of CVA , HFpEF, atrial fibrillation, chronic hypoxic respiratory failure on home O2, OSA on CPAP, lymphedema follows with wound care clinic-presented to the hospital on 8/31 with weakness/fall-he was found to have hypotension-he was thought to have septic shock in the setting of bilateral lower extremity soft tissue infection-admitted to the ICU-stabilized and transferred to [facility] on 9/3   Significant events: 8/31>> septic shock in the setting of bilateral lower extremity cellulitis-admit to ICU 9/03>> transferred to [facility]   Significant studies: 8/31>> CXR: Vascular congestion 8/31>> CT head: No acute abnormality 8/31>> CT  C-spine: No fracture/dislocation 8/31>> CT L-spine: Multilevel degenerative disc disease 8/31>> CT angio chest: No PE 9/01>> renal ultrasound: No hydronephrosis 9/01>> echo: EF 60-65%   Significant microbiology data: 8/31>> blood culture: No growth 8/31>> COVID PCR: Negative   Procedures: None   Consults: PCCM  Brief Hospital Course: Septic shock due to bilateral lower extremity soft tissue infection superimposed on bilateral lower extremity lymphedema Sepsis  physiology improved Cultures negative Completed a course of antibiotics during this hospitalization Wound care per wound care RN   AKI Hemodynamically mediated Resolved-creatinine back to baseline Continue to follow electrolytes closely.   Acute on chronic HFpEF History of moderate PAH Volume status much improved-suspect close to euvolemia -13.3 L so far Weight down to 352 pounds (381 pounds on admission, on last cardiology visit-367 pounds)  Transition to oral diuretics on discharge Continue Jardiance, continue beta-blocker/losartan on discharge Ensure follow-up with cardiology on discharge Attending MD at SNF to keep an eye on weights/volume status/electrolytes periodically.   PAF Flecainide/metoprolol Eliquis   HX OF CVA No obvious focal deficits Eliquis/statin   HTN Stable on metoprolol Restarting losartan on discharge   Chronic hypoxic respiratory failure on 2 L of oxygen at home OSA on CPAP nightly   GERD PPI   BPH Finasteride   RLS Requip   Mood disorder Anxiety issues much better after reassurance Continue Zoloft/lorazepam Follow-up with primary psychiatrist as previously scheduled.     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 (Active)  12/02/22 1823  Location: Buttocks  Location Orientation: Left  Staging: Deep Tissue Pressure Injury - Purple or maroon localized area of  discolored intact skin or blood-filled blister due to damage of underlying soft tissue from pressure and/or shear.  Wound Description (Comments):    Present on Admission: Yes  Dressing Type Foam - Lift dressing to assess site every shift 12/07/22 1930   Morbid Obesity: Estimated body mass index is 47.75 kg/m as calculated from the following:   Height as of this encounter: 6' (1.829 m).   Weight as of this encounter: 159.7 kg.    Discharge Diagnoses:  Principal Problem:   Septic shock [facility])   Discharge Instructions:  Activity:  As tolerated with Full fall precautions use walker/cane & assistance as needed  Discharge Instructions     (HEART FAILURE PATIENTS) Call MD:  Anytime you have any of the following symptoms: 1) 3 pound weight gain in 24 hours or 5 pounds in 1 week 2) shortness of breath, with or without a dry hacking cough 3) swelling in the hands, feet or stomach 4) if you have to sleep on extra pillows at night in order to breathe.   Complete by: As directed    Call MD for:  difficulty breathing, headache or visual disturbances   Complete by: As directed    Diet - low sodium heart healthy   Complete by: As directed    Discharge instructions   Complete by: As directed    Follow with Primary MD  [name], MD in 1-2 weeks  Follow with a cardiologist-Dr. Rennis Golden as scheduled  Please get a complete blood count and chemistry panel checked by your Primary MD at your next visit, and again as instructed by your Primary MD.  Get Medicines reviewed and adjusted: Please take all your medications with you for your next visit with your Primary MD  Laboratory/radiological data: Please request your Primary MD to go over all hospital tests and procedure/radiological results at the follow up, please ask your Primary MD to get all Hospital records sent to his/her office.  In some cases, they will be blood work, cultures and biopsy results pending at the time of your discharge. Please request that your primary care M.D. follows up on these results.  Also Note the following: If you experience worsening of your admission  symptoms, develop shortness of breath, life threatening emergency, suicidal or homicidal thoughts you must seek medical attention immediately by calling 911 or calling your MD immediately  if symptoms less severe.  You must read complete instructions/literature along with all the possible adverse reactions/side effects for all the Medicines you take and that have been prescribed to you. Take any new Medicines after you have completely understood and accpet all the possible adverse reactions/side effects.   Do not drive when taking Pain medications or sleeping medications (Benzodaizepines)  Do not take more than prescribed Pain, Sleep and Anxiety Medications. It is not advisable to combine anxiety,sleep and pain medications without talking with your primary care practitioner  Special Instructions: If you have smoked or chewed Tobacco  in the last 2 yrs please stop smoking, stop any regular Alcohol  and or any Recreational drug use.  Wear Seat belts while driving.  Please note: You were cared for by a hospitalist during your hospital stay. Once you are discharged, your primary care physician will handle any further medical issues. Please note that NO REFILLS for any discharge medications will be authorized once you are discharged, as it is imperative that you return to your primary care physician (or establish a relationship with a primary care physician if you  do not have one) for your post hospital discharge needs so that they can reassess your need for medications and monitor your lab values.   Discharge wound care:   Complete by: As directed    Bedside RN to perform: Cleanse bilateral lower legs with soap and water.  Apply Xeroform to large areas of erythema/weeping.  Cover with kerlix from below toes to below knee and secure with ace wrap.  Change every other day.  Barrier cream to buttocks each shift and offload pressure as possible.   Increase activity slowly   Complete by: As directed        Allergies as of 12/08/2022       Reactions   Morphine Nausea And Vomiting, Other (See Comments)   Severe hallucinations Other reaction(s): Hallucinations   Penicillins Other (See Comments)   Passed out after injection Did it involve swelling of the face/tongue/throat, SOB, or low BP? Unknown Did it involve sudden or severe rash/hives, skin peeling, or any reaction on the inside of your mouth or nose? No Did you need to seek medical attention at a hospital or doctor's office? Yes When did it last happen?    teenager   If all above answers are "NO", may proceed with cephalosporin use. Other reaction(s): Dizziness, Unknown   Codeine Nausea Only   Propoxyphene N-acetaminophen Nausea Only        Medication List     TAKE these medications    atorvastatin 80 MG tablet Commonly known as: LIPITOR TAKE 1 TABLET(80 MG) BY MOUTH DAILY   carboxymethylcellulose 0.5 % Soln Commonly known as: REFRESH PLUS Place 1 drop into both eyes 3 (three) times daily as needed (dry eyes).   [employer] MELATONIN GUMMIES PO Take 5 mg by mouth at bedtime.   DSS 100 MG Caps Take 100 mg by mouth every other day.   Eliquis 5 MG Tabs tablet Generic drug: apixaban TAKE 1 TABLET(5 MG) BY MOUTH TWICE DAILY   finasteride 5 MG tablet Commonly known as: PROSCAR Take 1 tablet (5 mg total) by mouth daily.   flecainide 50 MG tablet Commonly known as: TAMBOCOR TAKE 1 TABLET(50 MG) BY MOUTH EVERY 12 HOURS   Jardiance 10 MG Tabs tablet Generic drug: empagliflozin TAKE 1 TABLET(10 MG) BY MOUTH DAILY BEFORE BREAKFAST   loratadine 10 MG tablet Commonly known as: CLARITIN Take 10 mg by mouth daily.   LORazepam 0.5 MG tablet Commonly known as: ATIVAN Take 1 tablet (0.5 mg total) by mouth at bedtime.   losartan 25 MG tablet Commonly known as: COZAAR Take 1 tablet (25 mg total) by mouth daily.   metoprolol succinate 25 MG 24 hr tablet Commonly known as: TOPROL-XL TAKE 1 TABLET(25 MG) BY MOUTH DAILY    montelukast 10 MG tablet Commonly known as: SINGULAIR TAKE 1 TABLET(10 MG) BY MOUTH AT BEDTIME   multivitamin with minerals Tabs tablet Take 1 tablet by mouth daily.   pantoprazole 40 MG tablet Commonly known as: PROTONIX TAKE 1 TABLET(40 MG) BY MOUTH DAILY   potassium chloride SA 20 MEQ tablet Commonly known as: KLOR-CON M Take 1 tablet (20 mEq total) by mouth 2 (two) times daily.   rOPINIRole 0.5 MG tablet Commonly known as: REQUIP TAKE 1 TABLET(0.5 MG) BY MOUTH THREE TIMES DAILY   sertraline 100 MG tablet Commonly known as: ZOLOFT Take 1.5 tablets (150 mg total) by mouth at bedtime.   torsemide 20 MG tablet Commonly known as: DEMADEX TAKE 3 TABLETS BY MOUTH EVERY DAY   traMADol  50 MG tablet Commonly known as: ULTRAM TAKE 1 TABLET BY MOUTH EVERY 8 HOURS AS NEEDED   zinc gluconate 50 MG tablet Take 1 tablet (50 mg total) by mouth daily.               Discharge Care Instructions  (From admission, onward)           Start     Ordered   12/08/22 0000  Discharge wound care:       Comments: Bedside RN to perform: Cleanse bilateral lower legs with soap and water.  Apply Xeroform to large areas of erythema/weeping.  Cover with kerlix from below toes to below knee and secure with ace wrap.  Change every other day.  Barrier cream to buttocks each shift and offload pressure as possible.   12/08/22 0848            Follow-up Information     [name], MD. Schedule an appointment as soon as possible for a visit in 1 week(s).   Specialty: Internal Medicine Contact information: [address] [place] [address] [phone]         Chrystie Nose, MD Follow up.   Specialty: Cardiology Why: keep existing appt Contact information: [address] [place] [address] [phone]                Allergies  Allergen Reactions   Morphine Nausea And Vomiting and Other (See Comments)    Severe hallucinations Other  reaction(s): Hallucinations   Penicillins Other (See Comments)    Passed out after injection Did it involve swelling of the face/tongue/throat, SOB, or low BP? Unknown Did it involve sudden or severe rash/hives, skin peeling, or any reaction on the inside of your mouth or nose? No Did you need to seek medical attention at a hospital or doctor's office? Yes When did it last happen?    teenager   If all above answers are "NO", may proceed with cephalosporin use. Other reaction(s): Dizziness, Unknown   Codeine Nausea Only   Propoxyphene N-Acetaminophen Nausea Only     Other Procedures/Studies: DG Chest Port 1V same Day  Result Date: 12/07/2022 CLINICAL DATA:  Shortness of breath EXAM: PORTABLE CHEST 1 VIEW COMPARISON:  CXR 12/02/22 FINDINGS: Cardiomegaly. No pleural effusion. No pneumothorax. Hazy opacity in the right lung base could represent atelectasis or infection. No radiographically apparent displaced rib fractures. Visualized upper abdomen is unremarkable. IMPRESSION: 1. Hazy opacity at the right lung base could represent atelectasis or infection. 2.  Cardiomegaly. Electronically Signed   By: Lorenza Cambridge M.D.   On: 12/07/2022 11:17   ECHOCARDIOGRAM COMPLETE  Result Date: 12/03/2022    ECHOCARDIOGRAM REPORT   Patient Name:   HAMPTON RHEW Uh [facility] Date of Exam: 12/03/2022 Medical Rec #:  [id number]         Height:       72.0 in Accession #:    [id number]        Weight:       381.6 lb Date of Birth:  [birth year]         BSA:          2.804 m Patient Age:    76 years          BP:           115/97 mmHg Patient Gender: M                 HR:  62 bpm. Exam Location:  Inpatient Procedure: 2D Echo, Cardiac Doppler, Color Doppler and Intracardiac            Opacification Agent Indications:    CHF-Acute Diastolic I50.31  History:        Patient has prior history of Echocardiogram examinations, most                 recent 05/17/2021. CHF, Stroke, Arrythmias:Atrial Fibrillation;                 Risk  Factors:Diabetes, Hypertension and Dyslipidemia. Morbid                 Obesity.  Sonographer:    Celesta Gentile RCS Referring Phys: [phone] WHITNEY D HARRIS IMPRESSIONS  1. Left ventricular ejection fraction, by estimation, is 60 to 65%. The left ventricle has normal function. The left ventricle has no regional wall motion abnormalities. There is mild concentric left ventricular hypertrophy. Left ventricular diastolic parameters are indeterminate.  2. Right ventricular systolic function is mildly reduced. The right ventricular size is moderately enlarged. There is mildly elevated pulmonary artery systolic pressure. The estimated right ventricular systolic pressure is 39.4 mmHg.  3. Left atrial size was moderately dilated.  4. Right atrial size was moderately dilated.  5. The mitral valve is normal in structure. No evidence of mitral valve regurgitation. No evidence of mitral stenosis.  6. The aortic valve is tricuspid. There is mild calcification of the aortic valve. Aortic valve regurgitation is not visualized. Aortic valve sclerosis/calcification is present, without any evidence of aortic stenosis.  7. Aortic dilatation noted. There is mild dilatation of the aortic root, measuring 41 mm.  8. The inferior vena cava is dilated in size with <50% respiratory variability, suggesting right atrial pressure of 15 mmHg. FINDINGS  Left Ventricle: Left ventricular ejection fraction, by estimation, is 60 to 65%. The left ventricle has normal function. The left ventricle has no regional wall motion abnormalities. Definity contrast agent was given IV to delineate the left ventricular  endocardial borders. The left ventricular internal cavity size was normal in size. There is mild concentric left ventricular hypertrophy. Left ventricular diastolic parameters are indeterminate. Right Ventricle: The right ventricular size is moderately enlarged. No increase in right ventricular wall thickness. Right ventricular systolic function is  mildly reduced. There is mildly elevated pulmonary artery systolic pressure. The tricuspid regurgitant velocity is 2.47 m/s, and with an assumed right atrial pressure of 15 mmHg, the estimated right ventricular systolic pressure is 39.4 mmHg. Left Atrium: Left atrial size was moderately dilated. Right Atrium: Right atrial size was moderately dilated. Pericardium: There is no evidence of pericardial effusion. Mitral Valve: The mitral valve is normal in structure. No evidence of mitral valve regurgitation. No evidence of mitral valve stenosis. MV peak gradient, 8.1 mmHg. The mean mitral valve gradient is 2.0 mmHg. Tricuspid Valve: The tricuspid valve is normal in structure. Tricuspid valve regurgitation is mild . No evidence of tricuspid stenosis. Aortic Valve: The aortic valve is tricuspid. There is mild calcification of the aortic valve. Aortic valve regurgitation is not visualized. Aortic valve sclerosis/calcification is present, without any evidence of aortic stenosis. Aortic valve mean gradient measures 8.0 mmHg. Aortic valve peak gradient measures 14.1 mmHg. Aortic valve area, by VTI measures 2.49 cm. Pulmonic Valve: The pulmonic valve was not well visualized. Pulmonic valve regurgitation is not visualized. No evidence of pulmonic stenosis. Aorta: Aortic dilatation noted. There is mild dilatation of the aortic root, measuring 41 mm. Venous: The  inferior vena cava is dilated in size with less than 50% respiratory variability, suggesting right atrial pressure of 15 mmHg. IAS/Shunts: No atrial level shunt detected by color flow Doppler.  LEFT VENTRICLE PLAX 2D LVIDd:         5.50 cm   Diastology LVIDs:         2.70 cm   LV e' medial:    8.49 cm/s LV PW:         1.10 cm   LV E/e' medial:  15.9 LV IVS:        1.20 cm   LV e' lateral:   13.90 cm/s LVOT diam:     2.10 cm   LV E/e' lateral: 9.7 LV SV:         114 LV SV Index:   41 LVOT Area:     3.46 cm  RIGHT VENTRICLE RV S prime:     14.60 cm/s TAPSE (M-mode): 2.2  cm LEFT ATRIUM              Index        RIGHT ATRIUM           Index LA diam:        5.40 cm  1.93 cm/m   RA Area:     20.90 cm LA Vol (A2C):   112.0 ml 39.95 ml/m  RA Volume:   63.20 ml  22.54 ml/m LA Vol (A4C):   126.0 ml 44.94 ml/m LA Biplane Vol: 120.0 ml 42.80 ml/m  AORTIC VALVE AV Area (Vmax):    2.76 cm AV Area (Vmean):   2.64 cm AV Area (VTI):     2.49 cm AV Vmax:           188.00 cm/s AV Vmean:          134.000 cm/s AV VTI:            0.457 m AV Peak Grad:      14.1 mmHg AV Mean Grad:      8.0 mmHg LVOT Vmax:         150.00 cm/s LVOT Vmean:        102.000 cm/s LVOT VTI:          0.328 m LVOT/AV VTI ratio: 0.72  AORTA Ao Root diam: 4.10 cm MITRAL VALVE                TRICUSPID VALVE MV Area (PHT): 2.83 cm     TR Peak grad:   24.4 mmHg MV Area VTI:   2.24 cm     TR Vmax:        247.00 cm/s MV Peak grad:  8.1 mmHg MV Mean grad:  2.0 mmHg     SHUNTS MV Vmax:       1.42 m/s     Systemic VTI:  0.33 m MV Vmean:      56.5 cm/s    Systemic Diam: 2.10 cm MV Decel Time: 268 msec MV E velocity: 135.00 cm/s MV A velocity: 72.20 cm/s MV E/A ratio:  1.87 Arvilla Meres MD Electronically signed by Arvilla Meres MD Signature Date/Time: 12/03/2022/10:18:53 PM    Final    US RENAL  Result Date: 12/03/2022 CLINICAL DATA:  Acute renal injury/insufficiency EXAM: RENAL / URINARY TRACT ULTRASOUND COMPLETE COMPARISON:  CT 01/25/2021 FINDINGS: Right Kidney: Renal measurements: 10.9 x 6.3 x 6.8 cm = volume: 245 mL. 4.5 cm lateral interpolar right renal hypoechoic lesion likely corresponds to a cyst on 01/25/2021 CT. Mild  renal cortical thinning, without hydronephrosis. Left Kidney: Renal measurements: 11.6 x 6.8 x 6.6 cm = volume: 272 mL. Mildly decreased echogenicity. Mild renal cortical thinning. No hydronephrosis. Bladder: Not visualized Other: Moderate  degraded by patient body habitus. IMPRESSION: Moderately degraded exam secondary to patient body habitus. No hydronephrosis or acute explanation for renal  insufficiency. Electronically Signed   By: Jeronimo Greaves M.D.   On: 12/03/2022 17:22   [place] EKG SITE RITE  Result Date: 12/02/2022 If Site Rite image not attached, placement could not be confirmed due to current cardiac rhythm.  CT Angio Chest PE W and/or Wo Contrast  Result Date: 12/02/2022 CLINICAL DATA:  Cough.  Pulmonary embolism suspected. EXAM: CT ANGIOGRAPHY CHEST WITH CONTRAST TECHNIQUE: Multidetector CT imaging of the chest was performed using the standard protocol during bolus administration of intravenous contrast. Multiplanar CT image reconstructions and MIPs were obtained to evaluate the vascular anatomy. RADIATION DOSE REDUCTION: This exam was performed according to the departmental dose-optimization program which includes automated exposure control, adjustment of the mA and/or kV according to patient size and/or use of iterative reconstruction technique. CONTRAST:  OMNIPAQUE IOHEXOL 350 MG/ML SOLN COMPARISON:  Chest radiograph earlier today.  CTA chest 03/22/2021 FINDINGS: Cardiovascular: The quality of this exam for evaluation of pulmonary embolism is poor to moderate. The bolus is suboptimally timed with contrast entering the SVC. Exam also degraded by patient body habitus. To the upper lungs, no embolism to the large segmental level. To the lower lungs, no embolism to the lobar to large segmental level. Smaller emboli cannot be excluded. 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 Aortic atherosclerosis. Ascending aortic dilatation is mild. Based on coronal reformats, 4.3 cm on 84/8. Increased from 4.1 cm when remeasured in a similar fashion on the prior. Moderate cardiomegaly, without pericardial effusion. Three vessel coronary artery calcification. Pulmonary artery enlargement, outflow tract 3.8 cm. Mediastinum/Nodes: No mediastinal or hilar adenopathy. Lungs/Pleura: No pleural fluid. Minimal exclusion of the left lung base. Mild volume loss and subsegmental  atelectasis at the right lung base. Marked right hemidiaphragm elevation. Upper Abdomen: Lap band, incompletely imaged. Nonspecific caudate lobe enlargement. Normal imaged portions of the spleen. Musculoskeletal: Mild convex right thoracic spine curvature. Review of the MIP images confirms the above findings. IMPRESSION: 1. Portal moderate evaluation for pulmonary embolism. No large embolism. 2. No explanation for cough 3. Coronary artery atherosclerosis. Aortic Atherosclerosis (ICD10-I70.0). 4. Pulmonary artery enlargement suggests pulmonary arterial hypertension. 5. Ascending aortic dilatation at up to 4.3 cm. Recommend annual imaging followup by CTA or MRA. This recommendation follows 2010 ACCF/AHA/AATS/ACR/ASA/SCA/SCAI/SIR/STS/SVM Guidelines for the Diagnosis and Management of Patients with Thoracic Aortic Disease. Circulation. 2010; 121: F643-P295. Aortic aneurysm NOS (ICD10-I71.9) Electronically Signed   By: Jeronimo Greaves M.D.   On: 12/02/2022 12:11   CT Head Wo Contrast  Result Date: 12/02/2022 CLINICAL DATA:  Patient fell backwards. Chronic anticoagulation. Back injury. EXAM: CT HEAD WITHOUT CONTRAST CT CERVICAL SPINE WITHOUT CONTRAST TECHNIQUE: Multidetector CT imaging of the head and cervical spine was performed following the standard protocol without intravenous contrast. Multiplanar CT image reconstructions of the cervical spine were also generated. RADIATION DOSE REDUCTION: This exam was performed according to the departmental dose-optimization program which includes automated exposure control, adjustment of the mA and/or kV according to patient size and/or use of iterative reconstruction technique. COMPARISON:  None Available. FINDINGS: CT HEAD FINDINGS Brain: There is no evidence for acute hemorrhage, hydrocephalus, mass lesion, or abnormal extra-axial fluid collection. No definite CT evidence for acute  infarction. Diffuse loss of parenchymal volume is consistent with atrophy. Patchy low  attenuation in the deep hemispheric and periventricular white matter is nonspecific, but likely reflects chronic microvascular ischemic demyelination. Vascular: No hyperdense vessel or unexpected calcification. Skull: No evidence for fracture. No worrisome lytic or sclerotic lesion. Sinuses/Orbits: The visualized paranasal sinuses and mastoid air cells are clear. Visualized portions of the globes and intraorbital fat are unremarkable. Other: None. CT CERVICAL SPINE FINDINGS Alignment: Normal. Skull base and vertebrae: No acute fracture. No primary bone lesion or focal pathologic process. Soft tissues and spinal canal: No prevertebral fluid or swelling. No visible canal hematoma. Disc levels: Diffuse loss of intervertebral disc height noted in the cervical spine. Facets are well aligned with mild degenerative changes at upper cervical levels. Upper chest: Unremarkable Other: None. IMPRESSION: 1. No acute intracranial abnormality. Atrophy with chronic small vessel white matter ischemic disease. 2. Degenerative changes in the cervical spine without fracture or subluxation. Electronically Signed   By: Kennith Center M.D.   On: 12/02/2022 10:44   CT Cervical Spine Wo Contrast  Result Date: 12/02/2022 CLINICAL DATA:  Patient fell backwards. Chronic anticoagulation. Back injury. EXAM: CT HEAD WITHOUT CONTRAST CT CERVICAL SPINE WITHOUT CONTRAST TECHNIQUE: Multidetector CT imaging of the head and cervical spine was performed following the standard protocol without intravenous contrast. Multiplanar CT image reconstructions of the cervical spine were also generated. RADIATION DOSE REDUCTION: This exam was performed according to the departmental dose-optimization program which includes automated exposure control, adjustment of the mA and/or kV according to patient size and/or use of iterative reconstruction technique. COMPARISON:  None Available. FINDINGS: CT HEAD FINDINGS Brain: There is no evidence for acute hemorrhage,  hydrocephalus, mass lesion, or abnormal extra-axial fluid collection. No definite CT evidence for acute infarction. Diffuse loss of parenchymal volume is consistent with atrophy. Patchy low attenuation in the deep hemispheric and periventricular white matter is nonspecific, but likely reflects chronic microvascular ischemic demyelination. Vascular: No hyperdense vessel or unexpected calcification. Skull: No evidence for fracture. No worrisome lytic or sclerotic lesion. Sinuses/Orbits: The visualized paranasal sinuses and mastoid air cells are clear. Visualized portions of the globes and intraorbital fat are unremarkable. Other: None. CT CERVICAL SPINE FINDINGS Alignment: Normal. Skull base and vertebrae: No acute fracture. No primary bone lesion or focal pathologic process. Soft tissues and spinal canal: No prevertebral fluid or swelling. No visible canal hematoma. Disc levels: Diffuse loss of intervertebral disc height noted in the cervical spine. Facets are well aligned with mild degenerative changes at upper cervical levels. Upper chest: Unremarkable Other: None. IMPRESSION: 1. No acute intracranial abnormality. Atrophy with chronic small vessel white matter ischemic disease. 2. Degenerative changes in the cervical spine without fracture or subluxation. Electronically Signed   By: Kennith Center M.D.   On: 12/02/2022 10:44   CT Lumbar Spine Wo Contrast  Result Date: 12/02/2022 CLINICAL DATA:  Lumbar radiculopathy after trauma.  Back pain. EXAM: CT LUMBAR SPINE WITHOUT CONTRAST TECHNIQUE: Multidetector CT imaging of the lumbar spine was performed without intravenous contrast administration. Multiplanar CT image reconstructions were also generated. RADIATION DOSE REDUCTION: This exam was performed according to the departmental dose-optimization program which includes automated exposure control, adjustment of the mA and/or kV according to patient size and/or use of iterative reconstruction technique. COMPARISON:   Lumbar spine MRI from 10/14/2018 FINDINGS: Segmentation: 5 lumbar type vertebrae. Alignment: Mild retrolisthesis of L4 on L5 measuring up to 3 mm. Unchanged from the previous exam. Stable straightening of lumbar lordosis. Vertebrae: Unchanged benign  vertebral hemangioma at L3-4. The lumbar vertebral body heights are well preserved without signs of acute fracture. Paraspinal and other soft tissues: Partially visualized conglomeration right kidney cysts are again noted measuring approximately 5.9 x 4.7 cm. Previously characterized as benign cysts. Aortic atherosclerotic calcifications noted. Disc levels: Multilevel disc space narrowing and endplate spurring identified. This is severe at the L4-5 level with marked disc space narrowing, endplate spurring and vacuum disc. There is mild to moderate facet hypertrophy noted. Posterior disc bulge/protrusion is again noted. Again seen is mild bilateral lateral recess stenosis. IMPRESSION: 1. No evidence for acute fracture or subluxation of the lumbar spine. 2. Multilevel degenerative disc disease, most severe at the L4-5 level. 3.  Aortic Atherosclerosis (ICD10-I70.0). Electronically Signed   By: Signa Kell M.D.   On: 12/02/2022 10:40   DG Chest Portable 1 View  Result Date: 12/02/2022 CLINICAL DATA:  Cough. EXAM: PORTABLE CHEST 1 VIEW COMPARISON:  03/22/2021 FINDINGS: Asymmetric elevation right hemidiaphragm. The cardio pericardial silhouette is enlarged. Vascular congestion with diffuse interstitial opacities suggesting edema. No focal consolidation or substantial pleural effusion. Telemetry leads overlie the chest. IMPRESSION: Vascular congestion with diffuse interstitial opacities suggesting edema. Electronically Signed   By: Kennith Center M.D.   On: 12/02/2022 09:24     TODAY-DAY OF DISCHARGE:  Subjective:   Oliva Bustard today has no headache,no chest abdominal pain,no new weakness tingling or numbness, feels much better wants to go home today.    Objective:   Blood pressure (!) 126/103, pulse 61, temperature (!) 97.5 F (36.4 C), temperature source Oral, resp. rate 19, height 6' (1.829 m), weight (!) 159.7 kg, SpO2 96%.  Intake/Output Summary (Last 24 hours) at 12/08/2022 0848 Last data filed at 12/08/2022 0400 Gross per 24 hour  Intake 780 ml  Output 1650 ml  Net -870 ml   Filed Weights   12/06/22 0436 12/07/22 0700 12/08/22 0740  Weight: (!) 167.1 kg (!) 163.5 kg (!) 159.7 kg    Exam: Awake Alert, Oriented *3, No new F.N deficits, Normal affect [place].AT,PERRAL Supple Neck,No JVD, No cervical lymphadenopathy appriciated.  Symmetrical Chest wall movement, Good air movement bilaterally, CTAB RRR,No Gallops,Rubs or new Murmurs, No Parasternal Heave +ve B.Sounds, Abd Soft, Non tender, No organomegaly appriciated, No rebound -guarding or rigidity. No Cyanosis, Clubbing or edema, No new Rash or bruise   PERTINENT RADIOLOGIC STUDIES: DG Chest Port 1V same Day  Result Date: 12/07/2022 CLINICAL DATA:  Shortness of breath EXAM: PORTABLE CHEST 1 VIEW COMPARISON:  CXR 12/02/22 FINDINGS: Cardiomegaly. No pleural effusion. No pneumothorax. Hazy opacity in the right lung base could represent atelectasis or infection. No radiographically apparent displaced rib fractures. Visualized upper abdomen is unremarkable. IMPRESSION: 1. Hazy opacity at the right lung base could represent atelectasis or infection. 2.  Cardiomegaly. Electronically Signed   By: Lorenza Cambridge M.D.   On: 12/07/2022 11:17     PERTINENT LAB RESULTS: CBC: Recent Labs    12/06/22 0315 12/07/22 0402  WBC 13.7* 13.5*  HGB 11.4* 12.2*  HCT 37.5* 39.0  PLT 173 223   CMET CMP     Component Value Date/Time   NA 145 12/08/2022 0338   NA 147 (H) 08/22/2022 1643   K 3.6 12/08/2022 0338   CL 95 (L) 12/08/2022 0338   CO2 38 (H) 12/08/2022 0338   GLUCOSE 118 (H) 12/08/2022 0338   BUN 26 (H) 12/08/2022 0338   BUN 32 (H) 08/22/2022 1643   CREATININE 1.12 12/08/2022 0338    CREATININE 1.18  10/27/2019 1400   CALCIUM 8.3 (L) 12/08/2022 0338   PROT 7.1 02/07/2022 1512   ALBUMIN 3.2 (L) 02/07/2022 1512   AST 23 02/07/2022 1512   ALT 19 02/07/2022 1512   ALKPHOS 142 (H) 02/07/2022 1512   BILITOT 0.7 02/07/2022 1512   GFR 59.82 (L) 10/10/2022 1557   EGFR 69 08/22/2022 1643   GFRNONAA >60 12/08/2022 0338   GFRNONAA 61 10/27/2019 1400    GFR Estimated Creatinine Clearance: 87.6 mL/min (by C-G formula based on SCr of 1.12 mg/dL). No results for input(s): "LIPASE", "AMYLASE" in the last 72 hours. No results for input(s): "CKTOTAL", "CKMB", "CKMBINDEX", "TROPONINI" in the last 72 hours. Invalid input(s): "POCBNP" No results for input(s): "DDIMER" in the last 72 hours. No results for input(s): "HGBA1C" in the last 72 hours. No results for input(s): "CHOL", "HDL", "LDLCALC", "TRIG", "CHOLHDL", "LDLDIRECT" in the last 72 hours. No results for input(s): "TSH", "T4TOTAL", "T3FREE", "THYROIDAB" in the last 72 hours.  Invalid input(s): "FREET3" No results for input(s): "VITAMINB12", "FOLATE", "FERRITIN", "TIBC", "IRON", "RETICCTPCT" in the last 72 hours. Coags: No results for input(s): "INR" in the last 72 hours.  Invalid input(s): "PT" Microbiology: Recent Results (from the past 240 hour(s))  SARS Coronavirus 2 by RT PCR (hospital order, performed in [facility] hospital lab) *cepheid single result test* Anterior Nasal Swab     Status: None   Collection Time: 12/02/22  8:50 AM   Specimen: Anterior Nasal Swab  Result Value Ref Range Status   SARS Coronavirus 2 by RT PCR NEGATIVE NEGATIVE Final    Comment: Performed at [facility], [address] [place], [place] [address]  Blood Culture (routine x 2)     Status: None   Collection Time: 12/02/22  8:55 AM   Specimen: BLOOD  Result Value Ref Range Status   Specimen Description BLOOD RIGHT ANTECUBITAL  Final   Special Requests   Final    BOTTLES DRAWN AEROBIC AND ANAEROBIC Blood Culture adequate volume    Culture   Final    NO GROWTH 5 DAYS Performed at [facility], [address], [place], [place] [address]    Report Status 12/07/2022 FINAL  Final  Blood Culture (routine x 2)     Status: None   Collection Time: 12/02/22 10:53 AM   Specimen: BLOOD LEFT FOREARM  Result Value Ref Range Status   Specimen Description BLOOD LEFT FOREARM  Final   Special Requests   Final    BOTTLES DRAWN AEROBIC AND ANAEROBIC Blood Culture adequate volume   Culture   Final    NO GROWTH 5 DAYS Performed at [facility], [address], [place], [place] [address]    Report Status 12/07/2022 FINAL  Final  MRSA Next Gen by PCR, Nasal     Status: None   Collection Time: 12/02/22 11:26 AM   Specimen: Nasal Mucosa; Nasal Swab  Result Value Ref Range Status   MRSA by PCR Next Gen NOT DETECTED NOT DETECTED Final    Comment: (NOTE) The GeneXpert MRSA Assay (FDA approved for NASAL specimens only), is one component of a comprehensive MRSA colonization surveillance program. It is not intended to diagnose MRSA infection nor to guide or monitor treatment for MRSA infections. Test performance is not FDA approved in patients less than 43 years old. Performed at [facility], [address] [place], [place] [address]     FURTHER DISCHARGE INSTRUCTIONS:  Get Medicines reviewed and adjusted: Please take all  your medications with you for your next visit with your Primary MD  Laboratory/radiological data: Please request your Primary MD to go over all hospital tests and procedure/radiological results at the follow up, please ask your Primary MD to get all Hospital records sent to his/her office.  In some cases, they will be blood work, cultures and biopsy results pending at the time of your discharge. Please request that your primary care M.D. goes through all the records of your hospital data and follows up on these results.  Also Note the following: If you experience worsening of your admission  symptoms, develop shortness of breath, life threatening emergency, suicidal or homicidal thoughts you must seek medical attention immediately by calling 911 or calling your MD immediately  if symptoms less severe.  You must read complete instructions/literature along with all the possible adverse reactions/side effects for all the Medicines you take and that have been prescribed to you. Take any new Medicines after you have completely understood and accpet all the possible adverse reactions/side effects.   Do not drive when taking Pain medications or sleeping medications (Benzodaizepines)  Do not take more than prescribed Pain, Sleep and Anxiety Medications. It is not advisable to combine anxiety,sleep and pain medications without talking with your primary care practitioner  Special Instructions: If you have smoked or chewed Tobacco  in the last 2 yrs please stop smoking, stop any regular Alcohol  and or any Recreational drug use.  Wear Seat belts while driving.  Please note: You were cared for by a hospitalist during your hospital stay. Once you are discharged, your primary care physician will handle any further medical issues. Please note that NO REFILLS for any discharge medications will be authorized once you are discharged, as it is imperative that you return to your primary care physician (or establish a relationship with a primary care physician if you do not have one) for your post hospital discharge needs so that they can reassess your need for medications and monitor your lab values.  Total Time spent coordinating discharge including counseling, education and face to face time equals greater than 30 minutes.  SignedJeoffrey Massed 12/08/2022 8:48 AM

## 2022-12-08 NOTE — TOC Transition Note (Signed)
Transition of Care [facility]) - CM/SW Discharge Note   Patient Details  Name: [name] MRN: [id number] Date of Birth: [birth year]  Transition of Care [facility]) CM/SW Contact:  Mearl Latin, LCSW Phone Number: 12/08/2022, 11:45 AM   Clinical Narrative:    Patient will DC to: [facility] Anticipated DC date: 12/08/22 Family notified: Spouse Transport by: Sharin Mons   Per MD patient ready for DC to [facility]. RN to call report prior to discharge [phone] room 115a). RN, patient, patient's family, and facility notified of DC. Discharge Summary and FL2 sent to facility. DC packet on chart including signed script. Ambulance transport requested for patient.   CSW will sign off for now as social work intervention is no longer needed. Please consult [place] again if new needs arise.     Final next level of care: Skilled Nursing Facility Barriers to Discharge: Barriers Resolved   Patient Goals and CMS Choice CMS Medicare.gov Compare Post Acute Care list provided to:: Patient Choice offered to / list presented to : Patient, Spouse  Discharge Placement     Existing PASRR number confirmed : 12/08/22          Patient [facility] bed at:  [facility]) Patient to be transferred to facility by: [facility] Name of family member notified: Spouse Patient and family notified of of transfer: 12/08/22  Discharge Plan and Services Additional resources added to the After Visit Summary for   In-house Referral: Clinical Social Work   Post Acute Care Choice: Skilled Nursing Facility                               Social Determinants of Health (SDOH) Interventions SDOH Screenings   Food Insecurity: No Food Insecurity (04/25/2022)  Housing: Low Risk  (04/25/2022)  Transportation Needs: No Transportation Needs (04/25/2022)  Utilities: Not At Risk (04/25/2022)  Alcohol Screen: Low Risk  (12/16/2020)  Depression (PHQ2-9): Low Risk  (10/10/2022)  Financial Resource Strain: Low Risk  (12/16/2020)   Physical Activity: Inactive (04/25/2022)  Social Connections: Moderately Isolated (04/25/2022)  Stress: No Stress Concern Present (04/25/2022)  Tobacco Use: Medium Risk (12/02/2022)     Readmission Risk Interventions     No data to display

## 2022-12-08 NOTE — TOC Progression Note (Signed)
Transition of Care [facility]) - Progression Note    Patient Details  Name: [name] MRN: [id number] Date of Birth: 03-Jul-1945  [facility]) CM/SW Contact  Mearl Latin, LCSW Phone Number: 12/08/2022, 9:42 AM  Clinical Narrative:    Patient ready to discharge to [facility] today. Facility aware and received DC Summary. Patient will require [facility] for transport.      Barriers to Discharge: Barriers resolved  Expected Discharge Plan and Services In-house Referral: Clinical Social Work     Living arrangements for the past 2 months: Single Family Home Expected Discharge Date: 12/08/22                                     Social Determinants of Health (SDOH) Interventions SDOH Screenings   Food Insecurity: No Food Insecurity (04/25/2022)  Housing: Low Risk  (04/25/2022)  Transportation Needs: No Transportation Needs (04/25/2022)  Utilities: Not At Risk (04/25/2022)  Alcohol Screen: Low Risk  (12/16/2020)  Depression (PHQ2-9): Low Risk  (10/10/2022)  Financial Resource Strain: Low Risk  (12/16/2020)  Physical Activity: Inactive (04/25/2022)  Social Connections: Moderately Isolated (04/25/2022)  Stress: No Stress Concern Present (04/25/2022)  Tobacco Use: Medium Risk (12/02/2022)    Readmission Risk Interventions     No data to display

## 2022-12-11 ENCOUNTER — Telehealth: Payer: Self-pay

## 2022-12-11 ENCOUNTER — Encounter (HOSPITAL_COMMUNITY): Payer: Self-pay | Admitting: Psychiatry

## 2022-12-11 ENCOUNTER — Telehealth (HOSPITAL_BASED_OUTPATIENT_CLINIC_OR_DEPARTMENT_OTHER): Payer: Medicare Other | Admitting: Psychiatry

## 2022-12-11 VITALS — Wt 352.0 lb

## 2022-12-11 DIAGNOSIS — J9611 Chronic respiratory failure with hypoxia: Secondary | ICD-10-CM | POA: Diagnosis not present

## 2022-12-11 DIAGNOSIS — K219 Gastro-esophageal reflux disease without esophagitis: Secondary | ICD-10-CM | POA: Diagnosis not present

## 2022-12-11 DIAGNOSIS — G4733 Obstructive sleep apnea (adult) (pediatric): Secondary | ICD-10-CM | POA: Diagnosis not present

## 2022-12-11 DIAGNOSIS — F419 Anxiety disorder, unspecified: Secondary | ICD-10-CM

## 2022-12-11 DIAGNOSIS — I4891 Unspecified atrial fibrillation: Secondary | ICD-10-CM | POA: Diagnosis not present

## 2022-12-11 DIAGNOSIS — E785 Hyperlipidemia, unspecified: Secondary | ICD-10-CM | POA: Diagnosis not present

## 2022-12-11 DIAGNOSIS — F32A Depression, unspecified: Secondary | ICD-10-CM | POA: Diagnosis not present

## 2022-12-11 DIAGNOSIS — J302 Other seasonal allergic rhinitis: Secondary | ICD-10-CM | POA: Diagnosis not present

## 2022-12-11 DIAGNOSIS — G4700 Insomnia, unspecified: Secondary | ICD-10-CM | POA: Diagnosis not present

## 2022-12-11 DIAGNOSIS — N400 Enlarged prostate without lower urinary tract symptoms: Secondary | ICD-10-CM | POA: Diagnosis not present

## 2022-12-11 DIAGNOSIS — G2581 Restless legs syndrome: Secondary | ICD-10-CM | POA: Diagnosis not present

## 2022-12-11 DIAGNOSIS — F330 Major depressive disorder, recurrent, mild: Secondary | ICD-10-CM | POA: Diagnosis not present

## 2022-12-11 DIAGNOSIS — I10 Essential (primary) hypertension: Secondary | ICD-10-CM | POA: Diagnosis not present

## 2022-12-11 DIAGNOSIS — I509 Heart failure, unspecified: Secondary | ICD-10-CM | POA: Diagnosis not present

## 2022-12-11 MED ORDER — SERTRALINE HCL 100 MG PO TABS
150.0000 mg | ORAL_TABLET | Freq: Every day | ORAL | 0 refills | Status: DC
Start: 2022-12-11 — End: 2023-03-12

## 2022-12-11 MED ORDER — LORAZEPAM 0.5 MG PO TABS
0.5000 mg | ORAL_TABLET | Freq: Every day | ORAL | 2 refills | Status: DC
Start: 2022-12-11 — End: 2023-03-12

## 2022-12-11 NOTE — Transitions of Care (Post Inpatient/ED Visit) (Signed)
   12/11/2022  Name: [name] MRN: [id number] DOB: 1945/05/01  Today's TOC FU Call Status: Today's TOC FU Call Status:: Successful TOC FU Call Completed TOC FU Call Complete Date: 12/11/22 Patient's Name and Date of Birth confirmed.  Transition Care Management Follow-up Telephone Call Date of Discharge: 12/08/22 Discharge Facility: [facility]) Type of Discharge: Inpatient Admission Primary Inpatient Discharge Diagnosis:: Septic Shock How have you been since you were released from the hospital?: Better Any questions or concerns?: No  Items Reviewed: Did you receive and understand the discharge instructions provided?: Yes Medications obtained,verified, and reconciled?: Yes (Medications Reviewed) Any new allergies since your discharge?: No Dietary orders reviewed?: NA Do you have support at home?: Yes People in Home: facility resident Name of Support/Comfort Primary Source: [facility]  Medications Reviewed Today: Medications Reviewed Today   Medications were not reviewed in this encounter     Home Care and Equipment/Supplies: Were Home Health Services Ordered?: No Any new equipment or medical supplies ordered?: No  Functional Questionnaire: Do you need assistance with bathing/showering or dressing?: No Do you need assistance with meal preparation?: No Do you need assistance with eating?: No Do you have difficulty maintaining continence: No Do you need assistance with getting out of bed/getting out of a chair/moving?: Yes Do you have difficulty managing or taking your medications?: No  Follow up appointments reviewed: PCP Follow-up appointment confirmed?: No (will schedule f/u when he is released from rehab, needs to "get things in order" first and wants to go home before appt is made) MD Provider Line Number:[phone] Given: Yes Specialist Hospital Follow-up appointment confirmed?: NA Do you need transportation to your follow-up appointment?: No Do you  understand care options if your condition(s) worsen?: Yes-patient verbalized understanding

## 2022-12-11 NOTE — Telephone Encounter (Deleted)
Transition Care Management Follow-up Telephone Call Date of discharge and from where: *** How have you been since you were released from the hospital? *** Any questions or concerns? {YES/NO TOC TRACKING USE FOR MANAGED MEDICAID REPORTING:24185}  Items Reviewed: Did the pt receive and understand the discharge instructions provided? {YES/NO:21197} Medications obtained and verified? {YES/NO:21197} Other? {YES/NO:21197} Any new allergies since your discharge? {YES/NO:21197} Dietary orders reviewed? {YES/NO TITLE CASE:22902} Do you have support at home? {YES/NO:21197}  Home Care and Equipment/Supplies: Were home health services ordered? {Response; yes/no/na:63} If so, what is the name of the agency? ***  Has the agency set up a time to come to the patient's home? {Response; yes/no/na:63} Were any new equipment or medical supplies ordered?  {Yes/No:[id number]} What is the name of the medical supply agency? *** Were you able to get the supplies/equipment? {Response; yes/no/na:63} Do you have any questions related to the use of the equipment or supplies? {Yes/No:[id number]}  Functional Questionnaire: (I = Independent and D = Dependent) ADLs: ***  Bathing/Dressing- ***  Meal Prep- ***  Eating- ***  Maintaining continence- ***  Transferring/Ambulation- ***  Managing Meds- ***  Follow up appointments reviewed:  PCP Hospital f/u appt confirmed? {YES/NO:21197} Scheduled to see *** on *** @ ***. Specialist Hospital f/u appt confirmed? {YES/NO:21197} Scheduled to see *** on *** @ ***. Are transportation arrangements needed? {YES/NO:21197} If their condition worsens, is the pt aware to call PCP or go to the Emergency Dept.? {YES/NO TITLE CASE:22902} Was the patient provided with contact information for the PCP's office or ED? {YES/NO TITLE CASE:22902} Was to pt encouraged to call back with questions or concerns? {YES/NO TITLE CASE:22902}  

## 2022-12-11 NOTE — Progress Notes (Signed)
[facility] MD Virtual Progress Note   Patient Location: Rehab Place at [facility] Provider Location: Home Office  I connect with patient by telephone and verified that I am speaking with correct person by using two identifiers. I discussed the limitations of evaluation and management by telemedicine and the availability of in person appointments. I also discussed with the patient that there may be a patient responsible charge related to this service. The patient expressed understanding and agreed to proceed.  [name] [phone] 77 y.o.  12/11/2022 9:34 AM  History of Present Illness:  Patient is evaluated by phone session.  He reported had a rough weekend because he was discharged 2 days ago to [facility] after his stay in the hospital for 1 week.  Patient told he had a fall and found to have infection.  He was given the diagnosis of sepsis.  When he was transferred to rehab center his medicines were not given and he was very upset.  Finally after the argument they were able to give his Zoloft and lorazepam.  He admitted anxiety and depression because he is in a rehab center and like to go back to his home as soon as possible.  However he realized he needed to stay at least a month until he gets better and his strength.  He was happy to see his granddaughter 2 weeks ago.  He feels the current medicine is working and he denies any crying spells or any feeling of hopelessness or worthlessness.  He lost 10 pounds.  He is still on BiPAP.  He has no tremor or shakes or any EPS.  He is not able to walk but one of the goal of rehab is to start walking.  Denies any hallucination, paranoia, suicidal thoughts.  Past Psychiatric History: H/O inpatient in 2006 for depression and having suicidal thoughts. Took Risperdal but d/c after stroke in March 2018.    Outpatient Encounter Medications as of 12/11/2022  Medication Sig   atorvastatin (LIPITOR) 80 MG tablet TAKE  1 TABLET(80 MG) BY MOUTH DAILY   carboxymethylcellulose (REFRESH PLUS) 0.5 % SOLN Place 1 drop into both eyes 3 (three) times daily as needed (dry eyes).   [employer] MELATONIN GUMMIES PO Take 5 mg by mouth at bedtime.   Docusate Sodium (DSS) 100 MG CAPS Take 100 mg by mouth every other day.   ELIQUIS 5 MG TABS tablet TAKE 1 TABLET(5 MG) BY MOUTH TWICE DAILY   empagliflozin (JARDIANCE) 10 MG TABS tablet TAKE 1 TABLET(10 MG) BY MOUTH DAILY BEFORE BREAKFAST   finasteride (PROSCAR) 5 MG tablet Take 1 tablet (5 mg total) by mouth daily.   flecainide (TAMBOCOR) 50 MG tablet TAKE 1 TABLET(50 MG) BY MOUTH EVERY 12 HOURS   loratadine (CLARITIN) 10 MG tablet Take 10 mg by mouth daily.   LORazepam (ATIVAN) 0.5 MG tablet Take 1 tablet (0.5 mg total) by mouth at bedtime.   losartan (COZAAR) 25 MG tablet Take 1 tablet (25 mg total) by mouth daily.   metoprolol succinate (TOPROL-XL) 25 MG 24 hr tablet TAKE 1 TABLET(25 MG) BY MOUTH DAILY   montelukast (SINGULAIR) 10 MG tablet TAKE 1 TABLET(10 MG) BY MOUTH AT BEDTIME   Multiple Vitamin (MULTIVITAMIN WITH MINERALS) TABS tablet Take 1 tablet by mouth daily.   pantoprazole (PROTONIX) 40 MG tablet TAKE 1 TABLET(40 MG) BY MOUTH DAILY   potassium chloride SA (KLOR-CON M) 20 MEQ tablet Take 1 tablet (20 mEq total) by mouth 2 (two)  times daily.   rOPINIRole (REQUIP) 0.5 MG tablet TAKE 1 TABLET(0.5 MG) BY MOUTH THREE TIMES DAILY   sertraline (ZOLOFT) 100 MG tablet Take 1.5 tablets (150 mg total) by mouth at bedtime.   torsemide (DEMADEX) 20 MG tablet TAKE 3 TABLETS BY MOUTH EVERY DAY   traMADol (ULTRAM) 50 MG tablet TAKE 1 TABLET BY MOUTH EVERY 8 HOURS AS NEEDED   zinc gluconate 50 MG tablet Take 1 tablet (50 mg total) by mouth daily.   No facility-administered encounter medications on file as of 12/11/2022.    Recent Results (from the past 2160 hour(s))  Hemoglobin A1c     Status: None   Collection Time: 10/10/22  3:57 PM  Result Value Ref Range   Hgb A1c MFr Bld 6.3  4.6 - 6.5 %    Comment: Glycemic Control Guidelines for People with Diabetes:Non Diabetic:  <6%Goal of Therapy: <7%Additional Action Suggested:  >8%   Folate     Status: None   Collection Time: 10/10/22  3:57 PM  Result Value Ref Range   Folate >23.8 >5.9 ng/mL  CBC with Differential/Platelet     Status: Abnormal   Collection Time: 10/10/22  3:57 PM  Result Value Ref Range   WBC 9.6 4.0 - 10.5 K/uL   RBC 4.19 (L) 4.22 - 5.81 Mil/uL   Hemoglobin 12.5 (L) 13.0 - 17.0 g/dL   HCT 78.2 95.6 - 21.3 %   MCV 93.5 78.0 - 100.0 fl   MCHC 31.8 30.0 - 36.0 g/dL   RDW 08.6 57.8 - 46.9 %   Platelets 210.0 150.0 - 400.0 K/uL   Neutrophils Relative % 77.2 (H) 43.0 - 77.0 %   Lymphocytes Relative 10.3 (L) 12.0 - 46.0 %   Monocytes Relative 10.5 3.0 - 12.0 %   Eosinophils Relative 1.2 0.0 - 5.0 %   Basophils Relative 0.8 0.0 - 3.0 %   Neutro Abs 7.4 1.4 - 7.7 K/uL   Lymphs Abs 1.0 0.7 - 4.0 K/uL   Monocytes Absolute 1.0 0.1 - 1.0 K/uL   Eosinophils Absolute 0.1 0.0 - 0.7 K/uL   Basophils Absolute 0.1 0.0 - 0.1 K/uL  Vitamin B12     Status: Abnormal   Collection Time: 10/10/22  3:57 PM  Result Value Ref Range   Vitamin B-12 1,265 (H) 211 - 911 pg/mL  Basic metabolic panel     Status: Abnormal   Collection Time: 10/10/22  3:57 PM  Result Value Ref Range   Sodium 145 135 - 145 mEq/L   Potassium 4.3 3.5 - 5.1 mEq/L   Chloride 100 96 - 112 mEq/L   CO2 38 (H) 19 - 32 mEq/L   Glucose, Bld 101 (H) 70 - 99 mg/dL   BUN 31 (H) 6 - 23 mg/dL   Creatinine, Ser 6.29 0.40 - 1.50 mg/dL   GFR 52.84 (L) >13.24 mL/min    Comment: Calculated using the CKD-EPI Creatinine Equation (2021)   Calcium 8.9 8.4 - 10.5 mg/dL  SARS Coronavirus 2 by RT PCR (hospital order, performed in [facility] hospital lab) *cepheid single result test* Anterior Nasal Swab     Status: None   Collection Time: 12/02/22  8:50 AM   Specimen: Anterior Nasal Swab  Result Value Ref Range   SARS Coronavirus 2 by RT PCR NEGATIVE NEGATIVE     Comment: Performed at [facility], [address], [place], [place] [address]  Urinalysis, Routine w reflex microscopic -Urine, Clean Catch     Status: Abnormal  Collection Time: 12/02/22  8:50 AM  Result Value Ref Range   Color, Urine YELLOW YELLOW   APPearance CLEAR CLEAR   Specific Gravity, Urine 1.009 1.005 - 1.030   pH 5.0 5.0 - 8.0   Glucose, UA >=500 (A) NEGATIVE mg/dL   Hgb urine dipstick NEGATIVE NEGATIVE   Bilirubin Urine NEGATIVE NEGATIVE   Ketones, ur NEGATIVE NEGATIVE mg/dL   Protein, ur NEGATIVE NEGATIVE mg/dL   Nitrite NEGATIVE NEGATIVE   Leukocytes,Ua NEGATIVE NEGATIVE   RBC / HPF 0-5 0 - 5 RBC/hpf   WBC, UA 0-5 0 - 5 WBC/hpf   Bacteria, UA RARE (A) NONE SEEN   Squamous Epithelial / HPF 0-5 0 - 5 /HPF   Mucus PRESENT    Hyaline Casts, UA PRESENT     Comment: Performed at [facility], [address] [place], [place] [address]  CBG monitoring, ED     Status: None   Collection Time: 12/02/22  8:51 AM  Result Value Ref Range   Glucose-Capillary 91 70 - 99 mg/dL    Comment: Glucose reference range applies only to samples taken after fasting for at least 8 hours.  Blood Culture (routine x 2)     Status: None   Collection Time: 12/02/22  8:55 AM   Specimen: BLOOD  Result Value Ref Range   Specimen Description BLOOD RIGHT ANTECUBITAL    Special Requests      BOTTLES DRAWN AEROBIC AND ANAEROBIC Blood Culture adequate volume   Culture      NO GROWTH 5 DAYS Performed at [facility], [address], [place], [place] [address]    Report Status 12/07/2022 FINAL   Basic metabolic panel     Status: Abnormal   Collection Time: 12/02/22  9:00 AM  Result Value Ref Range   Sodium 139 135 - 145 mmol/L   Potassium 3.4 (L) 3.5 - 5.1 mmol/L   Chloride 95 (L) 98 - 111 mmol/L   CO2 31 22 - 32 mmol/L   Glucose, Bld 112 (H) 70 - 99 mg/dL    Comment: Glucose reference range applies only to samples taken after fasting for at least 8 hours.   BUN 30 (H) 8 - 23  mg/dL   Creatinine, Ser 0.98 (H) 0.61 - 1.24 mg/dL   Calcium 8.1 (L) 8.9 - 10.3 mg/dL   GFR, Estimated 49 (L) >60 mL/min    Comment: (NOTE) Calculated using the CKD-EPI Creatinine Equation (2021)    Anion gap 13 5 - 15    Comment: Performed at [facility], [address], [place], [place] [address]  CBC     Status: Abnormal   Collection Time: 12/02/22  9:00 AM  Result Value Ref Range   WBC 22.0 (H) 4.0 - 10.5 K/uL   RBC 3.91 (L) 4.22 - 5.81 MIL/uL   Hemoglobin 11.9 (L) 13.0 - 17.0 g/dL   HCT 78.2 95.6 - 21.3 %   MCV 100.0 80.0 - 100.0 fL   MCH 30.4 26.0 - 34.0 pg   MCHC 30.4 30.0 - 36.0 g/dL   RDW 08.6 57.8 - 46.9 %   Platelets 177 150 - 400 K/uL   nRBC 0.0 0.0 - 0.2 %    Comment: Performed at [facility] Lab, [address] [place], [place] [address]  Brain natriuretic peptide     Status: Abnormal   Collection Time: 12/02/22  9:00 AM  Result Value Ref Range   B Natriuretic Peptide 771.9 (H)  0.0 - 100.0 pg/mL    Comment: Performed at [facility], [address], [place], [place] [address]  CK     Status: Abnormal   Collection Time: 12/02/22  9:00 AM  Result Value Ref Range   Total CK 566 (H) 49 - 397 U/L    Comment: Performed at [facility], [address] [place], [place] [address]  Protime-INR     Status: Abnormal   Collection Time: 12/02/22  9:00 AM  Result Value Ref Range   Prothrombin Time 19.4 (H) 11.4 - 15.2 seconds   INR 1.6 (H) 0.8 - 1.2    Comment: (NOTE) INR goal varies based on device and disease states. Performed at [facility], [address], [place], [place] [address]   APTT     Status: None   Collection Time: 12/02/22  9:00 AM  Result Value Ref Range   aPTT 34 24 - 36 seconds    Comment: Performed at [facility], [address], [place], [place] [address]  I-Stat CG4 Lactic Acid     Status: Abnormal   Collection Time: 12/02/22  9:43 AM  Result Value Ref Range   Lactic Acid, Venous 2.6 (HH) 0.5 - 1.9 mmol/L    Comment NOTIFIED PHYSICIAN   Blood Culture (routine x 2)     Status: None   Collection Time: 12/02/22 10:53 AM   Specimen: BLOOD LEFT FOREARM  Result Value Ref Range   Specimen Description BLOOD LEFT FOREARM    Special Requests      BOTTLES DRAWN AEROBIC AND ANAEROBIC Blood Culture adequate volume   Culture      NO GROWTH 5 DAYS Performed at [facility], [address] [place], [place] [address]    Report Status 12/07/2022 FINAL   I-Stat CG4 Lactic Acid     Status: None   Collection Time: 12/02/22 11:15 AM  Result Value Ref Range   Lactic Acid, Venous 1.5 0.5 - 1.9 mmol/L  MRSA Next Gen by PCR, Nasal     Status: None   Collection Time: 12/02/22 11:26 AM   Specimen: Nasal Mucosa; Nasal Swab  Result Value Ref Range   MRSA by PCR Next Gen NOT DETECTED NOT DETECTED    Comment: (NOTE) The GeneXpert MRSA Assay (FDA approved for NASAL specimens only), is one component of a comprehensive MRSA colonization surveillance program. It is not intended to diagnose MRSA infection nor to guide or monitor treatment for MRSA infections. Test performance is not FDA approved in patients less than 37 years old. Performed at [facility], [address] [place], [place] [address]   Glucose, capillary     Status: Abnormal   Collection Time: 12/02/22  6:03 PM  Result Value Ref Range   Glucose-Capillary 101 (H) 70 - 99 mg/dL    Comment: Glucose reference range applies only to samples taken after fasting for at least 8 hours.  Magnesium     Status: None   Collection Time: 12/03/22  2:52 AM  Result Value Ref Range   Magnesium 2.3 1.7 - 2.4 mg/dL    Comment: Performed at [facility], [address], [place], [place] [address]  CBC     Status: Abnormal   Collection Time: 12/03/22  2:52 AM  Result Value Ref Range   WBC 17.2 (H) 4.0 - 10.5 K/uL   RBC 3.50 (L) 4.22 - 5.81 MIL/uL   Hemoglobin 10.7 (L) 13.0 -  17.0 g/dL   HCT 65.7 (L) 84.6 - 96.2 %   MCV 98.9 80.0 - 100.0 fL   MCH  30.6 26.0 - 34.0 pg   MCHC 30.9 30.0 - 36.0 g/dL   RDW 95.2 84.1 - 32.4 %   Platelets 146 (L) 150 - 400 K/uL   nRBC 0.0 0.0 - 0.2 %    Comment: Performed at [facility], [address] [place], [place] [address]  Basic metabolic panel     Status: Abnormal   Collection Time: 12/03/22  2:52 AM  Result Value Ref Range   Sodium 138 135 - 145 mmol/L   Potassium 3.2 (L) 3.5 - 5.1 mmol/L   Chloride 95 (L) 98 - 111 mmol/L   CO2 28 22 - 32 mmol/L   Glucose, Bld 126 (H) 70 - 99 mg/dL    Comment: Glucose reference range applies only to samples taken after fasting for at least 8 hours.   BUN 40 (H) 8 - 23 mg/dL   Creatinine, Ser 7.25 (H) 0.61 - 1.24 mg/dL   Calcium 8.1 (L) 8.9 - 10.3 mg/dL   GFR, Estimated 45 (L) >60 mL/min    Comment: (NOTE) Calculated using the CKD-EPI Creatinine Equation (2021)    Anion gap 15 5 - 15    Comment: Performed at [facility], [address], [place], [place] [address]  Phosphorus     Status: None   Collection Time: 12/03/22  2:52 AM  Result Value Ref Range   Phosphorus 3.7 2.5 - 4.6 mg/dL    Comment: Performed at [facility], [address] [place], [place] [address]  Potassium     Status: Abnormal   Collection Time: 12/03/22  2:52 AM  Result Value Ref Range   Potassium 3.3 (L) 3.5 - 5.1 mmol/L    Comment: Performed at [facility], [address], [place], [place] [address]  Procalcitonin     Status: None   Collection Time: 12/03/22  2:52 AM  Result Value Ref Range   Procalcitonin 0.88 ng/mL    Comment:        Interpretation: PCT > 0.5 ng/mL and <= 2 ng/mL: Systemic infection (sepsis) is possible, but other conditions are known to elevate PCT as well. (NOTE)       Sepsis PCT Algorithm           Lower Respiratory Tract                                      Infection PCT Algorithm    ----------------------------     ----------------------------         PCT < 0.25 ng/mL                PCT < 0.10 ng/mL          Strongly  encourage             Strongly discourage   discontinuation of antibiotics    initiation of antibiotics    ----------------------------     -----------------------------       PCT 0.25 - 0.50 ng/mL            PCT 0.10 - 0.25 ng/mL               OR       >80% decrease in PCT  Discourage initiation of                                            antibiotics      Encourage discontinuation           of antibiotics    ----------------------------     -----------------------------         PCT >= 0.50 ng/mL              PCT 0.26 - 0.50 ng/mL                AND       <80% decrease in PCT             Encourage initiation of                                             antibiotics       Encourage continuation           of antibiotics    ----------------------------     -----------------------------        PCT >= 0.50 ng/mL                  PCT > 0.50 ng/mL               AND         increase in PCT                  Strongly encourage                                      initiation of antibiotics    Strongly encourage escalation           of antibiotics                                     -----------------------------                                           PCT <= 0.25 ng/mL                                                 OR                                        > 80% decrease in PCT                                      Discontinue / Do not initiate  antibiotics  Performed at [facility] Lab, [address] [place], [place] [address]   CK     Status: None   Collection Time: 12/03/22 11:53 AM  Result Value Ref Range   Total CK 250 49 - 397 U/L    Comment: Performed at [facility], [address] [place], [place] [address]  ECHOCARDIOGRAM COMPLETE     Status: None   Collection Time: 12/03/22  5:01 PM  Result Value Ref Range   Weight 6,105.86 oz   Height 72 in   BP 138/49 mmHg   S' Lateral 2.70 cm   AR max vel 2.76 cm2    AV Area VTI 2.49 cm2   AV Mean grad 8.0 mmHg   AV Peak grad 14.1 mmHg   Ao pk vel 1.88 m/s   Area-P 1/2 2.83 cm2   AV Area mean vel 2.64 cm2   MV VTI 2.24 cm2   Est EF 60 - 65%   CBC     Status: Abnormal   Collection Time: 12/04/22  5:07 AM  Result Value Ref Range   WBC 12.7 (H) 4.0 - 10.5 K/uL   RBC 3.40 (L) 4.22 - 5.81 MIL/uL   Hemoglobin 10.2 (L) 13.0 - 17.0 g/dL   HCT 42.5 (L) 95.6 - 38.7 %   MCV 95.9 80.0 - 100.0 fL   MCH 30.0 26.0 - 34.0 pg   MCHC 31.3 30.0 - 36.0 g/dL   RDW 56.4 33.2 - 95.1 %   Platelets 138 (L) 150 - 400 K/uL   nRBC 0.0 0.0 - 0.2 %    Comment: Performed at [facility], [address] [place], [place] [address]  Basic metabolic panel     Status: Abnormal   Collection Time: 12/04/22  5:07 AM  Result Value Ref Range   Sodium 139 135 - 145 mmol/L   Potassium 3.3 (L) 3.5 - 5.1 mmol/L   Chloride 97 (L) 98 - 111 mmol/L   CO2 31 22 - 32 mmol/L   Glucose, Bld 114 (H) 70 - 99 mg/dL    Comment: Glucose reference range applies only to samples taken after fasting for at least 8 hours.   BUN 37 (H) 8 - 23 mg/dL   Creatinine, Ser 6.06 (H) 0.61 - 1.24 mg/dL   Calcium 8.1 (L) 8.9 - 10.3 mg/dL   GFR, Estimated 44 (L) >60 mL/min    Comment: (NOTE) Calculated using the CKD-EPI Creatinine Equation (2021)    Anion gap 11 5 - 15    Comment: Performed at [facility], [address] [place], [place] [address]  Magnesium     Status: None   Collection Time: 12/04/22  5:07 AM  Result Value Ref Range   Magnesium 2.3 1.7 - 2.4 mg/dL    Comment: Performed at [facility], [address] [place], [place] [address]  Phosphorus     Status: None   Collection Time: 12/04/22  5:07 AM  Result Value Ref Range   Phosphorus 3.4 2.5 - 4.6 mg/dL    Comment: Performed at [facility], [address], [place], [place] [address]  CBC     Status: Abnormal   Collection Time: 12/05/22  2:27 AM  Result Value Ref Range   WBC 13.6 (H) 4.0 - 10.5 K/uL   RBC 3.57  (L) 4.22 - 5.81 MIL/uL   Hemoglobin 10.8 (L) 13.0 - 17.0 g/dL   HCT 22.0 (L) 25.4 -  52.0 %   MCV 97.2 80.0 - 100.0 fL   MCH 30.3 26.0 - 34.0 pg   MCHC 31.1 30.0 - 36.0 g/dL   RDW 62.6 94.8 - 54.6 %   Platelets 141 (L) 150 - 400 K/uL   nRBC 0.0 0.0 - 0.2 %    Comment: Performed at [facility], [address] [place], [place] [address]  Basic metabolic panel     Status: Abnormal   Collection Time: 12/05/22  2:27 AM  Result Value Ref Range   Sodium 143 135 - 145 mmol/L   Potassium 3.7 3.5 - 5.1 mmol/L   Chloride 96 (L) 98 - 111 mmol/L   CO2 34 (H) 22 - 32 mmol/L   Glucose, Bld 109 (H) 70 - 99 mg/dL    Comment: Glucose reference range applies only to samples taken after fasting for at least 8 hours.   BUN 29 (H) 8 - 23 mg/dL   Creatinine, Ser 0.09 (H) 0.61 - 1.24 mg/dL   Calcium 8.3 (L) 8.9 - 10.3 mg/dL   GFR, Estimated 59 (L) >60 mL/min    Comment: (NOTE) Calculated using the CKD-EPI Creatinine Equation (2021)    Anion gap 13 5 - 15    Comment: Performed at [facility], [address], [place], [place] [address]  Magnesium     Status: Abnormal   Collection Time: 12/05/22  2:27 AM  Result Value Ref Range   Magnesium 2.5 (H) 1.7 - 2.4 mg/dL    Comment: Performed at [facility], [address], [place], [place] [address]  Phosphorus     Status: None   Collection Time: 12/05/22  2:27 AM  Result Value Ref Range   Phosphorus 3.4 2.5 - 4.6 mg/dL    Comment: Performed at [facility], [address] [place], [place] [address]  CBC     Status: Abnormal   Collection Time: 12/06/22  3:15 AM  Result Value Ref Range   WBC 13.7 (H) 4.0 - 10.5 K/uL   RBC 3.75 (L) 4.22 - 5.81 MIL/uL   Hemoglobin 11.4 (L) 13.0 - 17.0 g/dL   HCT 93.8 (L) 10.1 - 75.1 %   MCV 100.0 80.0 - 100.0 fL   MCH 30.4 26.0 - 34.0 pg   MCHC 30.4 30.0 - 36.0 g/dL   RDW 02.5 85.2 - 77.8 %   Platelets 173 150 - 400 K/uL   nRBC 0.0 0.0 - 0.2 %    Comment: Performed at [facility]  Lab, [address], [place], [place] [address]  Basic metabolic panel     Status: Abnormal   Collection Time: 12/06/22  3:15 AM  Result Value Ref Range   Sodium 140 135 - 145 mmol/L   Potassium 3.5 3.5 - 5.1 mmol/L   Chloride 96 (L) 98 - 111 mmol/L   CO2 35 (H) 22 - 32 mmol/L   Glucose, Bld 111 (H) 70 - 99 mg/dL    Comment: Glucose reference range applies only to samples taken after fasting for at least 8 hours.   BUN 27 (H) 8 - 23 mg/dL   Creatinine, Ser 3.61 0.61 - 1.24 mg/dL   Calcium 8.4 (L) 8.9 - 10.3 mg/dL   GFR, Estimated >44 >31 mL/min    Comment: (NOTE) Calculated using the CKD-EPI Creatinine Equation (2021)    Anion gap 9 5 - 15    Comment: Performed at [facility], [address], [place],  [place] [address]  CBC     Status: Abnormal   Collection Time: 12/07/22  4:02 AM  Result Value Ref Range   WBC 13.5 (H) 4.0 - 10.5 K/uL   RBC 3.94 (L) 4.22 - 5.81 MIL/uL   Hemoglobin 12.2 (L) 13.0 - 17.0 g/dL   HCT 74.2 59.5 - 63.8 %   MCV 99.0 80.0 - 100.0 fL   MCH 31.0 26.0 - 34.0 pg   MCHC 31.3 30.0 - 36.0 g/dL   RDW 75.6 43.3 - 29.5 %   Platelets 223 150 - 400 K/uL   nRBC 0.0 0.0 - 0.2 %    Comment: Performed at [facility], [address] [place], [place] [address]  Basic metabolic panel     Status: Abnormal   Collection Time: 12/07/22  4:02 AM  Result Value Ref Range   Sodium 142 135 - 145 mmol/L   Potassium 3.3 (L) 3.5 - 5.1 mmol/L   Chloride 97 (L) 98 - 111 mmol/L   CO2 37 (H) 22 - 32 mmol/L   Glucose, Bld 112 (H) 70 - 99 mg/dL    Comment: Glucose reference range applies only to samples taken after fasting for at least 8 hours.   BUN 25 (H) 8 - 23 mg/dL   Creatinine, Ser 6.60 0.61 - 1.24 mg/dL   Calcium 8.6 (L) 8.9 - 10.3 mg/dL   GFR, Estimated >63 >01 mL/min    Comment: (NOTE) Calculated using the CKD-EPI Creatinine Equation (2021)    Anion gap 8 5 - 15    Comment: Performed at [facility], [address], [place], [place] [address]  Basic  metabolic panel     Status: Abnormal   Collection Time: 12/08/22  3:38 AM  Result Value Ref Range   Sodium 145 135 - 145 mmol/L   Potassium 3.6 3.5 - 5.1 mmol/L   Chloride 95 (L) 98 - 111 mmol/L   CO2 38 (H) 22 - 32 mmol/L   Glucose, Bld 118 (H) 70 - 99 mg/dL    Comment: Glucose reference range applies only to samples taken after fasting for at least 8 hours.   BUN 26 (H) 8 - 23 mg/dL   Creatinine, Ser 3.23 0.61 - 1.24 mg/dL   Calcium 8.3 (L) 8.9 - 10.3 mg/dL   GFR, Estimated >55 >73 mL/min    Comment: (NOTE) Calculated using the CKD-EPI Creatinine Equation (2021)    Anion gap 12 5 - 15    Comment: Performed at [facility] Lab, [address], [place], [place] [address]  Magnesium     Status: Abnormal   Collection Time: 12/08/22  3:38 AM  Result Value Ref Range   Magnesium 2.6 (H) 1.7 - 2.4 mg/dL    Comment: Performed at [facility] Lab, [address], [place], [place] [address]     Psychiatric Specialty Exam: Physical Exam  Review of Systems  Weight (!) 352 lb (159.7 kg).There is no height or weight on file to calculate BMI.  General Appearance: NA  Eye Contact:  NA  Speech:  Slow  Volume:  Decreased  Mood:  Anxious and Dysphoric  Affect:  NA  Thought Process:  Descriptions of Associations: Intact  Orientation:  Full (Time, Place, and Person)  Thought Content:  Rumination  Suicidal Thoughts:  No  Homicidal Thoughts:  No  Memory:  Immediate;   Good Recent;   Fair Remote;   Fair  Judgement:  Fair  Insight:  Present  Psychomotor Activity:  Decreased  Concentration:  Concentration: Fair and Attention Span: Fair  Recall:  [employer] of Knowledge:  Fair  Language:  Good  Akathisia:  No  Handed:  Right  AIMS (if indicated):     Assets:  Communication Skills Desire for Improvement Social Support  ADL's:  Intact  Cognition:  WNL  Sleep:  fair on BiPAP     Assessment/Plan: Major depressive disorder, recurrent episode, mild (HCC) - Plan: LORazepam (ATIVAN)  0.5 MG tablet, sertraline (ZOLOFT) 100 MG tablet  Anxiety - Plan: LORazepam (ATIVAN) 0.5 MG tablet  I reviewed notes and blood work results from recent hospitalization.  His BUN 26 and creatinine 1.12.  His WBC count still high.  He is slowly and gradually recovering after 1 week hospitalization or sepsis.  He is not happy at rehab but understand that he need to stay there until he gets strength back.  Reassurance given.  He was able to get his Zoloft and Ativan after some argument in discussion with the rehab center.  I encouraged him to have list of the medication and make sure that these medicines are on the list as he was not discontinued from the hospital during his last hospitalization.  Patient like to have a follow-up in 3 months.  I also offered therapy but at this time patient does not feel he needed.  He liked to focus on his physical health.  Recommended to call us back if is any question or any concern.  Follow-up in 3 months   Follow Up Instructions:     I discussed the assessment and treatment plan with the patient. The patient was provided an opportunity to ask questions and all were answered. The patient agreed with the plan and demonstrated an understanding of the instructions.   The patient was advised to call back or seek an in-person evaluation if the symptoms worsen or if the condition fails to improve as anticipated.    Collaboration of Care: Other provider involved in patient's care AEB notes are available in epic to review.  Patient/Guardian was advised Release of Information must be obtained prior to any record release in order to collaborate their care with an outside provider. Patient/Guardian was advised if they have not already done so to contact the registration department to sign all necessary forms in order for [place] to release information regarding their care.   Consent: Patient/Guardian gives verbal consent for treatment and assignment of benefits for services  provided during this visit. Patient/Guardian expressed understanding and agreed to proceed.     I provided 28 minutes of non face to face time during this encounter.  Note: This document was prepared by [employer] voice dictation technology and any errors that results from this process are unintentional.    Cleotis Nipper, MD 12/11/2022

## 2022-12-13 ENCOUNTER — Ambulatory Visit: Payer: Medicare Other | Admitting: Internal Medicine

## 2022-12-13 DIAGNOSIS — I2510 Atherosclerotic heart disease of native coronary artery without angina pectoris: Secondary | ICD-10-CM | POA: Diagnosis not present

## 2022-12-13 DIAGNOSIS — I5040 Unspecified combined systolic (congestive) and diastolic (congestive) heart failure: Secondary | ICD-10-CM | POA: Diagnosis not present

## 2022-12-13 DIAGNOSIS — L97412 Non-pressure chronic ulcer of right heel and midfoot with fat layer exposed: Secondary | ICD-10-CM | POA: Diagnosis not present

## 2022-12-13 DIAGNOSIS — M6281 Muscle weakness (generalized): Secondary | ICD-10-CM | POA: Diagnosis not present

## 2022-12-13 DIAGNOSIS — E119 Type 2 diabetes mellitus without complications: Secondary | ICD-10-CM | POA: Diagnosis not present

## 2022-12-13 DIAGNOSIS — R609 Edema, unspecified: Secondary | ICD-10-CM | POA: Diagnosis not present

## 2022-12-14 DIAGNOSIS — E876 Hypokalemia: Secondary | ICD-10-CM | POA: Diagnosis not present

## 2022-12-14 DIAGNOSIS — E785 Hyperlipidemia, unspecified: Secondary | ICD-10-CM | POA: Diagnosis not present

## 2022-12-14 DIAGNOSIS — I10 Essential (primary) hypertension: Secondary | ICD-10-CM | POA: Diagnosis not present

## 2022-12-14 DIAGNOSIS — I4891 Unspecified atrial fibrillation: Secondary | ICD-10-CM | POA: Diagnosis not present

## 2022-12-14 DIAGNOSIS — J302 Other seasonal allergic rhinitis: Secondary | ICD-10-CM | POA: Diagnosis not present

## 2022-12-14 DIAGNOSIS — F32A Depression, unspecified: Secondary | ICD-10-CM | POA: Diagnosis not present

## 2022-12-14 DIAGNOSIS — N400 Enlarged prostate without lower urinary tract symptoms: Secondary | ICD-10-CM | POA: Diagnosis not present

## 2022-12-14 DIAGNOSIS — I509 Heart failure, unspecified: Secondary | ICD-10-CM | POA: Diagnosis not present

## 2022-12-14 DIAGNOSIS — K219 Gastro-esophageal reflux disease without esophagitis: Secondary | ICD-10-CM | POA: Diagnosis not present

## 2022-12-14 DIAGNOSIS — G2581 Restless legs syndrome: Secondary | ICD-10-CM | POA: Diagnosis not present

## 2022-12-14 DIAGNOSIS — G4700 Insomnia, unspecified: Secondary | ICD-10-CM | POA: Diagnosis not present

## 2022-12-14 DIAGNOSIS — G4733 Obstructive sleep apnea (adult) (pediatric): Secondary | ICD-10-CM | POA: Diagnosis not present

## 2022-12-18 DIAGNOSIS — I4891 Unspecified atrial fibrillation: Secondary | ICD-10-CM | POA: Diagnosis not present

## 2022-12-18 DIAGNOSIS — F32A Depression, unspecified: Secondary | ICD-10-CM | POA: Diagnosis not present

## 2022-12-18 DIAGNOSIS — I10 Essential (primary) hypertension: Secondary | ICD-10-CM | POA: Diagnosis not present

## 2022-12-18 DIAGNOSIS — N400 Enlarged prostate without lower urinary tract symptoms: Secondary | ICD-10-CM | POA: Diagnosis not present

## 2022-12-18 DIAGNOSIS — E785 Hyperlipidemia, unspecified: Secondary | ICD-10-CM | POA: Diagnosis not present

## 2022-12-18 DIAGNOSIS — I509 Heart failure, unspecified: Secondary | ICD-10-CM | POA: Diagnosis not present

## 2022-12-18 DIAGNOSIS — G4700 Insomnia, unspecified: Secondary | ICD-10-CM | POA: Diagnosis not present

## 2022-12-18 DIAGNOSIS — J302 Other seasonal allergic rhinitis: Secondary | ICD-10-CM | POA: Diagnosis not present

## 2022-12-18 DIAGNOSIS — F419 Anxiety disorder, unspecified: Secondary | ICD-10-CM | POA: Diagnosis not present

## 2022-12-18 DIAGNOSIS — J9611 Chronic respiratory failure with hypoxia: Secondary | ICD-10-CM | POA: Diagnosis not present

## 2022-12-18 DIAGNOSIS — G4733 Obstructive sleep apnea (adult) (pediatric): Secondary | ICD-10-CM | POA: Diagnosis not present

## 2022-12-18 DIAGNOSIS — K219 Gastro-esophageal reflux disease without esophagitis: Secondary | ICD-10-CM | POA: Diagnosis not present

## 2022-12-20 DIAGNOSIS — I5040 Unspecified combined systolic (congestive) and diastolic (congestive) heart failure: Secondary | ICD-10-CM | POA: Diagnosis not present

## 2022-12-20 DIAGNOSIS — L97412 Non-pressure chronic ulcer of right heel and midfoot with fat layer exposed: Secondary | ICD-10-CM | POA: Diagnosis not present

## 2022-12-20 DIAGNOSIS — E119 Type 2 diabetes mellitus without complications: Secondary | ICD-10-CM | POA: Diagnosis not present

## 2022-12-20 DIAGNOSIS — I2510 Atherosclerotic heart disease of native coronary artery without angina pectoris: Secondary | ICD-10-CM | POA: Diagnosis not present

## 2022-12-20 DIAGNOSIS — M6281 Muscle weakness (generalized): Secondary | ICD-10-CM | POA: Diagnosis not present

## 2022-12-20 DIAGNOSIS — R609 Edema, unspecified: Secondary | ICD-10-CM | POA: Diagnosis not present

## 2022-12-26 ENCOUNTER — Ambulatory Visit (HOSPITAL_COMMUNITY): Payer: Medicare Other

## 2022-12-26 ENCOUNTER — Telehealth: Payer: Self-pay | Admitting: Internal Medicine

## 2022-12-26 NOTE — Telephone Encounter (Signed)
FYI.Marland KitchenMarland KitchenFax has been printed off and put in Dr Yetta Barre mailbox from [facility].

## 2022-12-27 NOTE — Telephone Encounter (Signed)
Form given to PCP to review

## 2022-12-29 ENCOUNTER — Telehealth: Payer: Self-pay | Admitting: Internal Medicine

## 2022-12-29 NOTE — Telephone Encounter (Signed)
LVM with verbal orders.  

## 2022-12-29 NOTE — Telephone Encounter (Signed)
Geraldine Contras called from [facility] stating they are delaying his PT today and starting Monday.  Best call back # [phone]

## 2023-01-04 ENCOUNTER — Inpatient Hospital Stay: Payer: Medicare Other | Admitting: Internal Medicine

## 2023-01-10 ENCOUNTER — Inpatient Hospital Stay: Payer: Medicare Other | Admitting: Internal Medicine

## 2023-01-15 NOTE — Telephone Encounter (Signed)
[facility] called back - please call [phone]

## 2023-01-15 NOTE — Telephone Encounter (Signed)
Noted  

## 2023-01-22 ENCOUNTER — Inpatient Hospital Stay: Payer: Medicare Other | Admitting: Internal Medicine

## 2023-01-24 ENCOUNTER — Other Ambulatory Visit: Payer: Self-pay | Admitting: Internal Medicine

## 2023-01-24 DIAGNOSIS — I480 Paroxysmal atrial fibrillation: Secondary | ICD-10-CM

## 2023-01-25 ENCOUNTER — Encounter: Payer: Self-pay | Admitting: Registered Nurse

## 2023-01-25 ENCOUNTER — Encounter: Payer: Medicare Other | Attending: Registered Nurse | Admitting: Registered Nurse

## 2023-01-25 VITALS — HR 55 | Ht 72.0 in | Wt 348.0 lb

## 2023-01-25 DIAGNOSIS — R202 Paresthesia of skin: Secondary | ICD-10-CM | POA: Diagnosis not present

## 2023-01-25 DIAGNOSIS — Z5181 Encounter for therapeutic drug level monitoring: Secondary | ICD-10-CM

## 2023-01-25 DIAGNOSIS — Z79891 Long term (current) use of opiate analgesic: Secondary | ICD-10-CM

## 2023-01-25 DIAGNOSIS — G8929 Other chronic pain: Secondary | ICD-10-CM

## 2023-01-25 DIAGNOSIS — I69398 Other sequelae of cerebral infarction: Secondary | ICD-10-CM | POA: Diagnosis not present

## 2023-01-25 DIAGNOSIS — M25561 Pain in right knee: Secondary | ICD-10-CM

## 2023-01-25 DIAGNOSIS — M25571 Pain in right ankle and joints of right foot: Secondary | ICD-10-CM | POA: Diagnosis not present

## 2023-01-25 DIAGNOSIS — G894 Chronic pain syndrome: Secondary | ICD-10-CM

## 2023-01-25 DIAGNOSIS — R269 Unspecified abnormalities of gait and mobility: Secondary | ICD-10-CM

## 2023-01-25 DIAGNOSIS — G629 Polyneuropathy, unspecified: Secondary | ICD-10-CM

## 2023-01-25 DIAGNOSIS — M25511 Pain in right shoulder: Secondary | ICD-10-CM

## 2023-01-25 DIAGNOSIS — M48061 Spinal stenosis, lumbar region without neurogenic claudication: Secondary | ICD-10-CM

## 2023-01-25 DIAGNOSIS — R001 Bradycardia, unspecified: Secondary | ICD-10-CM

## 2023-01-25 MED ORDER — TRAMADOL HCL 50 MG PO TABS: ORAL_TABLET | ORAL | 5 refills | Status: DC

## 2023-01-25 NOTE — Progress Notes (Signed)
Subjective:    Patient ID: [name], male    DOB: [birth year], 77 y.o.   MRN: [id number]  HPI: [name] is a 77 y.o. male who called office today, his wife is ill and unable to drive him to his appointment today, his appointment was changed to a Virtual visit. I connected with [name]  by a video enabled telemedicine application and verified that I am speaking with the correct person using two identifiers.  Location: Patient: At his Home  Provider: In the Office    I discussed the limitations of evaluation and management by telemedicine and the availability of in person appointments. The patient expressed understanding and agreed to proceed.  He states his  pain is located in his bilateral shoulders, lower back pain, lower extremities and bilateral feet with numbness . He also reports bilateral knee and Bilateral ankle pain .He rates his pain 5. His current exercise regime is walking and performing stretching exercises.  [name] was admitted to [facility] on 12/02/2022- 12/08/2022 for Septic Shock . Discharge Summary was reviewed. He was discharged to SNF. He has a scheduled appointment with PCP  Dr Yetta Barre on 01/29/2023.    [name] equivalent is 30.00 MME. He is also prescribed Lorazepam  by Dr. Lolly Mustache .We have discussed the black box warning of using opioids and benzodiazepines. I highlighted the dangers of using these drugs together and discussed the adverse events including respiratory suppression, overdose, cognitive impairment and importance of compliance with current regimen. We will continue to monitor and adjust as indicated.  he is being closely monitored and under the care of his psychiatrist.    Last Oral Swab was Performed on 07/26/2022, it was consistent.   Pain Inventory Average Pain 6 Pain Right Now 5 My pain is constant and aching  In the last 24 hours, has pain interfered with the following? General activity 6 Relation  with others 6 Enjoyment of life 6 What TIME of day is your pain at its worst? varies Sleep (in general) Fair  Pain is worse with: walking and standing Pain improves with: medication Relief from Meds: 6  Family History  Problem Relation Age of Onset  . Depression Mother   . Hypertension Mother   . Dementia Mother   . Heart disease Father        MI  . Depression Brother   . Stroke Paternal Aunt        CVA  . Diabetes Paternal Uncle   . Heart disease Paternal Uncle        MI  . Heart disease Paternal Grandmother        MI  . Diabetes Cousin        PATERNAL   Social History   Socioeconomic History  . Marital status: Married    Spouse name: Not on file  . Number of children: 1  . Years of education: Not on file  . Highest education level: Not on file  Occupational History  . Occupation: retired-Manager [employer]  Tobacco Use  . Smoking status: Former    Current packs/day: 0.00    Average packs/day: 0.2 packs/day for 13.4 years (2.7 ttl pk-yrs)    Types: Cigarettes    Start date: 04/03/1961    Quit date: 08/30/1974    Years since quitting: 48.4  . Smokeless tobacco: Never  Vaping Use  . Vaping status: Never Used  Substance and Sexual Activity  . Alcohol use: No  Alcohol/week: 0.0 standard drinks of alcohol  . Drug use: No  . Sexual activity: Not Currently  Other Topics Concern  . Not on file  Social History Narrative   Lives at home w/ his wife   Right-handed   Caffeine: decaf coffee, occasional tea   Social Determinants of Health   Financial Resource Strain: Low Risk  (12/16/2020)   Overall Financial Resource Strain (CARDIA)   . Difficulty of Paying Living Expenses: Not hard at all  Food Insecurity: No Food Insecurity (12/08/2022)   Hunger Vital Sign   . Worried About Programme researcher, broadcasting/film/video in the Last Year: Never true   . Ran Out of Food in the Last Year: Never true  Transportation Needs: No Transportation Needs (12/08/2022)   PRAPARE - Transportation   .  Lack of Transportation (Medical): No   . Lack of Transportation (Non-Medical): No  Physical Activity: Inactive (04/25/2022)   Exercise Vital Sign   . Days of Exercise per Week: 0 days   . Minutes of Exercise per Session: 0 min  Stress: No Stress Concern Present (04/25/2022)   [employer] of Occupational Health - Occupational Stress Questionnaire   . Feeling of Stress : Not at all  Social Connections: Moderately Isolated (04/25/2022)   Social Connection and Isolation Panel [NHANES]   . Frequency of Communication with Friends and Family: More than three times a week   . Frequency of Social Gatherings with Friends and Family: More than three times a week   . Attends Religious Services: Never   . Active Member of Clubs or Organizations: No   . Attends Banker Meetings: Never   . Marital Status: Married   Past Surgical History:  Procedure Laterality Date  . ARTHROSCOPY KNEE W/ DRILLING    . BREATH TEK H PYLORI N/A 12/23/2012   Procedure: BREATH TEK H PYLORI;  Surgeon: Atilano Ina, MD;  Location: [facility] ENDOSCOPY;  Service: General;  Laterality: N/A;  . CHOLECYSTECTOMY  1989  . COLONOSCOPY N/A 07/31/2013   Procedure: COLONOSCOPY;  Surgeon: Iva Boop, MD;  Location: [facility] ENDOSCOPY;  Service: Endoscopy;  Laterality: N/A;  . ESOPHAGOGASTRODUODENOSCOPY N/A 07/22/2013   Procedure: ESOPHAGOGASTRODUODENOSCOPY (EGD);  Surgeon: Hilarie Fredrickson, MD;  Location: [facility] ENDOSCOPY;  Service: Endoscopy;  Laterality: N/A;  . ESOPHAGOGASTRODUODENOSCOPY N/A 07/31/2013   Procedure: ESOPHAGOGASTRODUODENOSCOPY (EGD);  Surgeon: Iva Boop, MD;  Location: [facility] ENDOSCOPY;  Service: Endoscopy;  Laterality: N/A;  . INTRAOPERATIVE TRANSESOPHAGEAL ECHOCARDIOGRAM N/A 07/18/2013   Procedure: INTRAOPERATIVE TRANSESOPHAGEAL ECHOCARDIOGRAM;  Surgeon: Delight Ovens, MD;  Location: [facility] OR;  Service: Open Heart Surgery;  Laterality: N/A;  . LAPAROSCOPIC GASTRIC BANDING WITH HIATAL HERNIA REPAIR N/A 04/08/2013    Procedure: LAPAROSCOPIC GASTRIC BANDING WITH possible  HIATAL HERNIA REPAIR;  Surgeon: Atilano Ina, MD;  Location: [facility] ORS;  Service: General;  Laterality: N/A;  . LITHOTRIPSY    . renal calculi    . RIGHT AND LEFT HEART CATH N/A 05/12/2021   Procedure: RIGHT AND LEFT HEART CATH;  Surgeon: Dolores Patty, MD;  Location: [facility] INVASIVE CV LAB;  Service: Cardiovascular;  Laterality: N/A;  . SUBXYPHOID PERICARDIAL WINDOW N/A 07/18/2013   Procedure: SUBXYPHOID PERICARDIAL WINDOW;  Surgeon: Delight Ovens, MD;  Location: [facility] OR;  Service: Thoracic;  Laterality: N/A;  . TONSILLECTOMY    . total knee replacment     bilat  . trigger finger repair     also lue, cts surgically repaired  . WISDOM TOOTH EXTRACTION  Past Surgical History:  Procedure Laterality Date  . ARTHROSCOPY KNEE W/ DRILLING    . BREATH TEK H PYLORI N/A 12/23/2012   Procedure: BREATH TEK H PYLORI;  Surgeon: Atilano Ina, MD;  Location: [facility] ENDOSCOPY;  Service: General;  Laterality: N/A;  . CHOLECYSTECTOMY  1989  . COLONOSCOPY N/A 07/31/2013   Procedure: COLONOSCOPY;  Surgeon: Iva Boop, MD;  Location: [facility] ENDOSCOPY;  Service: Endoscopy;  Laterality: N/A;  . ESOPHAGOGASTRODUODENOSCOPY N/A 07/22/2013   Procedure: ESOPHAGOGASTRODUODENOSCOPY (EGD);  Surgeon: Hilarie Fredrickson, MD;  Location: [facility] ENDOSCOPY;  Service: Endoscopy;  Laterality: N/A;  . ESOPHAGOGASTRODUODENOSCOPY N/A 07/31/2013   Procedure: ESOPHAGOGASTRODUODENOSCOPY (EGD);  Surgeon: Iva Boop, MD;  Location: [facility] ENDOSCOPY;  Service: Endoscopy;  Laterality: N/A;  . INTRAOPERATIVE TRANSESOPHAGEAL ECHOCARDIOGRAM N/A 07/18/2013   Procedure: INTRAOPERATIVE TRANSESOPHAGEAL ECHOCARDIOGRAM;  Surgeon: Delight Ovens, MD;  Location: [facility] OR;  Service: Open Heart Surgery;  Laterality: N/A;  . LAPAROSCOPIC GASTRIC BANDING WITH HIATAL HERNIA REPAIR N/A 04/08/2013   Procedure: LAPAROSCOPIC GASTRIC BANDING WITH possible  HIATAL HERNIA REPAIR;  Surgeon: Atilano Ina, MD;  Location: [facility]  ORS;  Service: General;  Laterality: N/A;  . LITHOTRIPSY    . renal calculi    . RIGHT AND LEFT HEART CATH N/A 05/12/2021   Procedure: RIGHT AND LEFT HEART CATH;  Surgeon: Dolores Patty, MD;  Location: [facility] INVASIVE CV LAB;  Service: Cardiovascular;  Laterality: N/A;  . SUBXYPHOID PERICARDIAL WINDOW N/A 07/18/2013   Procedure: SUBXYPHOID PERICARDIAL WINDOW;  Surgeon: Delight Ovens, MD;  Location: [facility] OR;  Service: Thoracic;  Laterality: N/A;  . TONSILLECTOMY    . total knee replacment     bilat  . trigger finger repair     also lue, cts surgically repaired  . WISDOM TOOTH EXTRACTION     Past Medical History:  Diagnosis Date  . Anemia 07/29/2013  . Asthma    childhood asthma  . Benign essential HTN   . Benign prostatic hypertrophy    several prostate biopsies neg for cancer.   . Bilateral hip pain   . Cancer (HCC)   . CHF (congestive heart failure) (HCC)   . Chronic anticoagulation 07/22/2013  . Complication of anesthesia    [name] CAUSES HALLUCINATIONS  . CVA (cerebral vascular accident) (HCC) 06/2016  . Depression   . Diastolic congestive heart failure (HCC) 07/16/2013  . Dysphagia, post-stroke   . 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 FH: colonic polyps 2010  . Gout   . History of kidney stones   . Hypertension   . Kidney cysts   . Morbid obesity (HCC)   . Numbness    feet / legs  . PAF (paroxysmal atrial fibrillation) with RVR 07/29/2013  . Pericarditis 2015   s/p window  . Recurrent pulmonary emboli (HCC) 06/13/2015  . Sleep apnea    USES C PAP  . Stroke syndrome   . 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   Ht 6' (1.829 m)   Wt (!) 348 lb (157.9 kg)   BMI 47.20 kg/m   Opioid Risk Score:   Fall Risk Score:  `1  Depression screen PHQ 2/9     10/10/2022    3:04 PM 06/19/2022    2:24 PM 04/25/2022    1:56 PM 05/24/2021    2:11 PM 12/16/2020    4:15 PM 12/07/2020    1:19 PM 11/09/2020    2:11 PM  Depression screen PHQ 2/9  Decreased Interest 0 0 0 1 0 1 0  Down, Depressed, Hopeless 0 0 0 1 0 1 0  PHQ - 2 Score 0 0 0 2 0 2 0  Altered sleeping 0   0   0  Tired, decreased energy 0   0   0  Change in appetite 0   0   0  Feeling bad or failure about yourself  0   0   0  Trouble concentrating 0   0   0  Moving slowly or fidgety/restless 0   0   0  Suicidal thoughts 0   0   0  PHQ-9 Score 0   2   0     Review of Systems  Musculoskeletal:  Positive for myalgias.  All other systems reviewed and are negative.     Objective:   Physical Exam Vitals and nursing note reviewed.  Musculoskeletal:     Comments: No Assessment: Virtual Visit          Assessment & Plan:  1.Chronic Low Back Pain/  Lumbosacral Spondylosis without myelopathy: S/P 09/03/2020:  Bilateral Lumbar L3, L4 medial branch blocks and L 5 dorsal ramus injection under fluoroscopic guidance   with good relief noted. Encouraged to increase HEP as tolerated. Continue to monitor. 01/25/2023 2. Chronic Deconditioning/ Morbid Obesity History of Wallenberg Syndrome: Continue  HEP as Tolerated. He verbalizes understanding. Continue to Monitor. 01/25/2023 3. Paresthesia Lower Extremities: Continue Tramadol. Continue to Monitor.  4. Bilateral Chronic Knee pain: Continue HEP as Tolerated. Continue to Monitor.   5. Chronic Pain Syndrome: Refilled Tramadol  50 mg one tablet three times a day as needed for pain #90. Continue to monitor.  We will continue the opioid monitoring program, this consists of regular clinic visits, examinations, urine drug screen, pill counts as well as use of [place] Controlled Substance Reporting system.01/25/2023 6. Bradycardia: [name] reports Cardiology Following. Continue to Monitor.  7. Polyneuropathy: Continue Tramadol. Continue to Monitor.  8. Chronic Ankle Pain: Continue HEP as Tolerated. Continue current medication regimen. Continue to Monitor.  9. Chronic Bilateral Shoulder Pain: Continue HEP as Tolerated. Continue to Monitor.  F/U in 6 months    Virtual Visit Established Patient Location of Patient: In His Home Location of Provider: In the Office  F/U in 6 months

## 2023-01-29 ENCOUNTER — Ambulatory Visit (INDEPENDENT_AMBULATORY_CARE_PROVIDER_SITE_OTHER): Payer: Medicare Other | Admitting: Internal Medicine

## 2023-01-29 ENCOUNTER — Encounter: Payer: Self-pay | Admitting: Internal Medicine

## 2023-01-29 VITALS — BP 136/60 | HR 62 | Temp 98.7°F | Resp 16 | Ht 72.0 in | Wt 358.0 lb

## 2023-01-29 DIAGNOSIS — M1A09X0 Idiopathic chronic gout, multiple sites, without tophus (tophi): Secondary | ICD-10-CM | POA: Diagnosis not present

## 2023-01-29 DIAGNOSIS — I10 Essential (primary) hypertension: Secondary | ICD-10-CM | POA: Diagnosis not present

## 2023-01-29 DIAGNOSIS — E538 Deficiency of other specified B group vitamins: Secondary | ICD-10-CM

## 2023-01-29 DIAGNOSIS — Z23 Encounter for immunization: Secondary | ICD-10-CM | POA: Diagnosis not present

## 2023-01-29 DIAGNOSIS — Z7984 Long term (current) use of oral hypoglycemic drugs: Secondary | ICD-10-CM

## 2023-01-29 DIAGNOSIS — E118 Type 2 diabetes mellitus with unspecified complications: Secondary | ICD-10-CM

## 2023-01-29 DIAGNOSIS — E785 Hyperlipidemia, unspecified: Secondary | ICD-10-CM

## 2023-01-29 DIAGNOSIS — D538 Other specified nutritional anemias: Secondary | ICD-10-CM | POA: Diagnosis not present

## 2023-01-29 LAB — CBC WITH DIFFERENTIAL/PLATELET
Basophils Absolute: 0.1 10*3/uL (ref 0.0–0.1)
Basophils Relative: 0.6 % (ref 0.0–3.0)
Eosinophils Absolute: 0.1 10*3/uL (ref 0.0–0.7)
Eosinophils Relative: 1 % (ref 0.0–5.0)
HCT: 36 % — ABNORMAL LOW (ref 39.0–52.0)
Hemoglobin: 11.6 g/dL — ABNORMAL LOW (ref 13.0–17.0)
Lymphocytes Relative: 10.5 % — ABNORMAL LOW (ref 12.0–46.0)
Lymphs Abs: 1.1 10*3/uL (ref 0.7–4.0)
MCHC: 32.2 g/dL (ref 30.0–36.0)
MCV: 93.6 fL (ref 78.0–100.0)
Monocytes Absolute: 0.7 10*3/uL (ref 0.1–1.0)
Monocytes Relative: 6.8 % (ref 3.0–12.0)
Neutro Abs: 8.4 10*3/uL — ABNORMAL HIGH (ref 1.4–7.7)
Neutrophils Relative %: 81.1 % — ABNORMAL HIGH (ref 43.0–77.0)
Platelets: 213 10*3/uL (ref 150.0–400.0)
RBC: 3.85 Mil/uL — ABNORMAL LOW (ref 4.22–5.81)
RDW: 14.9 % (ref 11.5–15.5)
WBC: 10.4 10*3/uL (ref 4.0–10.5)

## 2023-01-29 LAB — BASIC METABOLIC PANEL
BUN: 22 mg/dL (ref 6–23)
CO2: 33 meq/L — ABNORMAL HIGH (ref 19–32)
Calcium: 8.5 mg/dL (ref 8.4–10.5)
Chloride: 100 meq/L (ref 96–112)
Creatinine, Ser: 1.12 mg/dL (ref 0.40–1.50)
GFR: 63.55 mL/min (ref >60.00)
Glucose, Bld: 104 mg/dL — ABNORMAL HIGH (ref 70–99)
Potassium: 3.8 meq/L (ref 3.5–5.1)
Sodium: 141 meq/L (ref 135–145)

## 2023-01-29 LAB — URIC ACID: Uric Acid, Serum: 8.7 mg/dL — ABNORMAL HIGH (ref 4.0–7.8)

## 2023-01-29 LAB — LIPID PANEL
Cholesterol: 114 mg/dL (ref 0–200)
HDL: 28.7 mg/dL — ABNORMAL LOW (ref >39.00)
LDL Cholesterol: 61 mg/dL (ref 0–99)
NonHDL: 85.7
Total CHOL/HDL Ratio: 4
Triglycerides: 126 mg/dL (ref 0.0–149.0)
VLDL: 25.2 mg/dL (ref 0.0–40.0)

## 2023-01-29 LAB — FOLATE: Folate: 23.1 ng/mL (ref >5.9)

## 2023-01-29 LAB — TSH: TSH: 1.89 u[IU]/mL (ref 0.35–5.50)

## 2023-01-29 LAB — VITAMIN B12: Vitamin B-12: 476 pg/mL (ref 211–911)

## 2023-01-29 LAB — HEMOGLOBIN A1C: Hgb A1c MFr Bld: 6.1 % (ref 4.6–6.5)

## 2023-01-29 MED ORDER — ALLOPURINOL 100 MG PO TABS: 100.0000 mg | ORAL_TABLET | Freq: Every day | ORAL | 0 refills | Status: DC

## 2023-01-29 NOTE — Progress Notes (Unsigned)
Subjective:  Patient ID: [name], male    DOB: 04-Jul-1945  Age: 77 y.o. MRN: [id number]  CC: Anemia, Congestive Heart Failure, Hyperlipidemia, and Diabetes   HPI [name] presents for f/up ----  Discussed the use of AI scribe software for clinical note transcription with the patient, who gave verbal consent to proceed.  History of Present Illness   The patient, with a history of lymphedema, presents with complaints of leg discomfort and impaired sensation. They report that their compression socks, which they find beneficial, tend to slide down due to the size of their legs, leading to circulation cut-off and dark spots on the top of their foot. 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  In the recent past, the patient suffered from sepsis shock due to an undiagnosed infection on their right ankle, which led to a significant weight loss of about thirty pounds. Despite efforts to monitor their diet, they struggle with weight loss.  The patient also reports a history of Pulmonary Arterial Hypertension (PAH) and was advised against exercise due to their condition. They express a desire for honesty and bluntness in their medical care.       Outpatient Medications Prior to Visit  Medication Sig Dispense Refill   atorvastatin (LIPITOR) 80 MG tablet TAKE 1 TABLET(80 MG) BY MOUTH DAILY 90 tablet 1   carboxymethylcellulose (REFRESH PLUS) 0.5 % SOLN Place 1 drop into both eyes 3 (three) times daily as needed (dry eyes).     [facility] MELATONIN GUMMIES PO Take 5 mg by mouth at bedtime.     Docusate Sodium (DSS) 100 MG CAPS Take 100 mg by mouth every other day.     ELIQUIS 5 MG TABS tablet TAKE 1 TABLET(5 MG) BY MOUTH TWICE DAILY 180 tablet 0   empagliflozin (JARDIANCE) 10 MG TABS tablet TAKE 1 TABLET(10 MG) BY MOUTH DAILY BEFORE BREAKFAST 90 tablet 2   finasteride (PROSCAR) 5 MG  tablet Take 1 tablet (5 mg total) by mouth daily. 30 tablet 1   flecainide (TAMBOCOR) 50 MG tablet TAKE 1 TABLET(50 MG) BY MOUTH EVERY 12 HOURS 180 tablet 3   LORazepam (ATIVAN) 0.5 MG tablet Take 1 tablet (0.5 mg total) by mouth at bedtime. 30 tablet 2   losartan (COZAAR) 25 MG tablet Take 1 tablet (25 mg total) by mouth daily. 90 tablet 3   metoprolol succinate (TOPROL-XL) 25 MG 24 hr tablet TAKE 1 TABLET(25 MG) BY MOUTH DAILY 90 tablet 3   montelukast (SINGULAIR) 10 MG tablet TAKE 1 TABLET(10 MG) BY MOUTH AT BEDTIME 90 tablet 1   Multiple Vitamin (MULTIVITAMIN WITH MINERALS) TABS tablet Take 1 tablet by mouth daily.     pantoprazole (PROTONIX) 40 MG tablet TAKE 1 TABLET(40 MG) BY MOUTH DAILY 90 tablet 1   potassium chloride SA (KLOR-CON M) 20 MEQ tablet Take 1 tablet (20 mEq total) by mouth 2 (two) times daily. 180 tablet 3   rOPINIRole (REQUIP) 0.5 MG tablet TAKE 1 TABLET(0.5 MG) BY MOUTH THREE TIMES DAILY 270 tablet 2   sertraline (ZOLOFT) 100 MG tablet Take 1.5 tablets (150 mg total) by mouth at bedtime. 135 tablet 0   torsemide (DEMADEX) 20 MG tablet TAKE 3 TABLETS BY MOUTH EVERY DAY 270 tablet 2   traMADol (ULTRAM) 50 MG tablet TAKE 1 TABLET BY MOUTH EVERY 8 HOURS AS NEEDED 90 tablet 5  Subjective:  Patient ID: [name], male    DOB: 04-Jul-1945  Age: 77 y.o. MRN: [id number]  CC: Anemia, Congestive Heart Failure, Hyperlipidemia, and Diabetes   HPI [name] presents for f/up ----  Discussed the use of AI scribe software for clinical note transcription with the patient, who gave verbal consent to proceed.  History of Present Illness   The patient, with a history of lymphedema, presents with complaints of leg discomfort and impaired sensation. They report that their compression socks, which they find beneficial, tend to slide down due to the size of their legs, leading to circulation cut-off and dark spots on the top of their foot. 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  In the recent past, the patient suffered from sepsis shock due to an undiagnosed infection on their right ankle, which led to a significant weight loss of about thirty pounds. Despite efforts to monitor their diet, they struggle with weight loss.  The patient also reports a history of Pulmonary Arterial Hypertension (PAH) and was advised against exercise due to their condition. They express a desire for honesty and bluntness in their medical care.       Outpatient Medications Prior to Visit  Medication Sig Dispense Refill   atorvastatin (LIPITOR) 80 MG tablet TAKE 1 TABLET(80 MG) BY MOUTH DAILY 90 tablet 1   carboxymethylcellulose (REFRESH PLUS) 0.5 % SOLN Place 1 drop into both eyes 3 (three) times daily as needed (dry eyes).     [facility] MELATONIN GUMMIES PO Take 5 mg by mouth at bedtime.     Docusate Sodium (DSS) 100 MG CAPS Take 100 mg by mouth every other day.     ELIQUIS 5 MG TABS tablet TAKE 1 TABLET(5 MG) BY MOUTH TWICE DAILY 180 tablet 0   empagliflozin (JARDIANCE) 10 MG TABS tablet TAKE 1 TABLET(10 MG) BY MOUTH DAILY BEFORE BREAKFAST 90 tablet 2   finasteride (PROSCAR) 5 MG  tablet Take 1 tablet (5 mg total) by mouth daily. 30 tablet 1   flecainide (TAMBOCOR) 50 MG tablet TAKE 1 TABLET(50 MG) BY MOUTH EVERY 12 HOURS 180 tablet 3   LORazepam (ATIVAN) 0.5 MG tablet Take 1 tablet (0.5 mg total) by mouth at bedtime. 30 tablet 2   losartan (COZAAR) 25 MG tablet Take 1 tablet (25 mg total) by mouth daily. 90 tablet 3   metoprolol succinate (TOPROL-XL) 25 MG 24 hr tablet TAKE 1 TABLET(25 MG) BY MOUTH DAILY 90 tablet 3   montelukast (SINGULAIR) 10 MG tablet TAKE 1 TABLET(10 MG) BY MOUTH AT BEDTIME 90 tablet 1   Multiple Vitamin (MULTIVITAMIN WITH MINERALS) TABS tablet Take 1 tablet by mouth daily.     pantoprazole (PROTONIX) 40 MG tablet TAKE 1 TABLET(40 MG) BY MOUTH DAILY 90 tablet 1   potassium chloride SA (KLOR-CON M) 20 MEQ tablet Take 1 tablet (20 mEq total) by mouth 2 (two) times daily. 180 tablet 3   rOPINIRole (REQUIP) 0.5 MG tablet TAKE 1 TABLET(0.5 MG) BY MOUTH THREE TIMES DAILY 270 tablet 2   sertraline (ZOLOFT) 100 MG tablet Take 1.5 tablets (150 mg total) by mouth at bedtime. 135 tablet 0   torsemide (DEMADEX) 20 MG tablet TAKE 3 TABLETS BY MOUTH EVERY DAY 270 tablet 2   traMADol (ULTRAM) 50 MG tablet TAKE 1 TABLET BY MOUTH EVERY 8 HOURS AS NEEDED 90 tablet 5  Subjective:  Patient ID: [name], male    DOB: 04-Jul-1945  Age: 77 y.o. MRN: [id number]  CC: Anemia, Congestive Heart Failure, Hyperlipidemia, and Diabetes   HPI [name] presents for f/up ----  Discussed the use of AI scribe software for clinical note transcription with the patient, who gave verbal consent to proceed.  History of Present Illness   The patient, with a history of lymphedema, presents with complaints of leg discomfort and impaired sensation. They report that their compression socks, which they find beneficial, tend to slide down due to the size of their legs, leading to circulation cut-off and dark spots on the top of their foot. 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  In the recent past, the patient suffered from sepsis shock due to an undiagnosed infection on their right ankle, which led to a significant weight loss of about thirty pounds. Despite efforts to monitor their diet, they struggle with weight loss.  The patient also reports a history of Pulmonary Arterial Hypertension (PAH) and was advised against exercise due to their condition. They express a desire for honesty and bluntness in their medical care.       Outpatient Medications Prior to Visit  Medication Sig Dispense Refill   atorvastatin (LIPITOR) 80 MG tablet TAKE 1 TABLET(80 MG) BY MOUTH DAILY 90 tablet 1   carboxymethylcellulose (REFRESH PLUS) 0.5 % SOLN Place 1 drop into both eyes 3 (three) times daily as needed (dry eyes).     [facility]S MELATONIN GUMMIES PO Take 5 mg by mouth at bedtime.     Docusate Sodium (DSS) 100 MG CAPS Take 100 mg by mouth every other day.     ELIQUIS 5 MG TABS tablet TAKE 1 TABLET(5 MG) BY MOUTH TWICE DAILY 180 tablet 0   empagliflozin (JARDIANCE) 10 MG TABS tablet TAKE 1 TABLET(10 MG) BY MOUTH DAILY BEFORE BREAKFAST 90 tablet 2   finasteride (PROSCAR) 5 MG  tablet Take 1 tablet (5 mg total) by mouth daily. 30 tablet 1   flecainide (TAMBOCOR) 50 MG tablet TAKE 1 TABLET(50 MG) BY MOUTH EVERY 12 HOURS 180 tablet 3   LORazepam (ATIVAN) 0.5 MG tablet Take 1 tablet (0.5 mg total) by mouth at bedtime. 30 tablet 2   losartan (COZAAR) 25 MG tablet Take 1 tablet (25 mg total) by mouth daily. 90 tablet 3   metoprolol succinate (TOPROL-XL) 25 MG 24 hr tablet TAKE 1 TABLET(25 MG) BY MOUTH DAILY 90 tablet 3   montelukast (SINGULAIR) 10 MG tablet TAKE 1 TABLET(10 MG) BY MOUTH AT BEDTIME 90 tablet 1   Multiple Vitamin (MULTIVITAMIN WITH MINERALS) TABS tablet Take 1 tablet by mouth daily.     pantoprazole (PROTONIX) 40 MG tablet TAKE 1 TABLET(40 MG) BY MOUTH DAILY 90 tablet 1   potassium chloride SA (KLOR-CON M) 20 MEQ tablet Take 1 tablet (20 mEq total) by mouth 2 (two) times daily. 180 tablet 3   rOPINIRole (REQUIP) 0.5 MG tablet TAKE 1 TABLET(0.5 MG) BY MOUTH THREE TIMES DAILY 270 tablet 2   sertraline (ZOLOFT) 100 MG tablet Take 1.5 tablets (150 mg total) by mouth at bedtime. 135 tablet 0   torsemide (DEMADEX) 20 MG tablet TAKE 3 TABLETS BY MOUTH EVERY DAY 270 tablet 2   traMADol (ULTRAM) 50 MG tablet TAKE 1 TABLET BY MOUTH EVERY 8 HOURS AS NEEDED 90 tablet 5  Subjective:  Patient ID: [name], male    DOB: 04-Jul-1945  Age: 77 y.o. MRN: [id number]  CC: Anemia, Congestive Heart Failure, Hyperlipidemia, and Diabetes   HPI [name] presents for f/up ----  Discussed the use of AI scribe software for clinical note transcription with the patient, who gave verbal consent to proceed.  History of Present Illness   The patient, with a history of lymphedema, presents with complaints of leg discomfort and impaired sensation. They report that their compression socks, which they find beneficial, tend to slide down due to the size of their legs, leading to circulation cut-off and dark spots on the top of their foot. 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  In the recent past, the patient suffered from sepsis shock due to an undiagnosed infection on their right ankle, which led to a significant weight loss of about thirty pounds. Despite efforts to monitor their diet, they struggle with weight loss.  The patient also reports a history of Pulmonary Arterial Hypertension (PAH) and was advised against exercise due to their condition. They express a desire for honesty and bluntness in their medical care.       Outpatient Medications Prior to Visit  Medication Sig Dispense Refill   atorvastatin (LIPITOR) 80 MG tablet TAKE 1 TABLET(80 MG) BY MOUTH DAILY 90 tablet 1   carboxymethylcellulose (REFRESH PLUS) 0.5 % SOLN Place 1 drop into both eyes 3 (three) times daily as needed (dry eyes).     [facility] MELATONIN GUMMIES PO Take 5 mg by mouth at bedtime.     Docusate Sodium (DSS) 100 MG CAPS Take 100 mg by mouth every other day.     ELIQUIS 5 MG TABS tablet TAKE 1 TABLET(5 MG) BY MOUTH TWICE DAILY 180 tablet 0   empagliflozin (JARDIANCE) 10 MG TABS tablet TAKE 1 TABLET(10 MG) BY MOUTH DAILY BEFORE BREAKFAST 90 tablet 2   finasteride (PROSCAR) 5 MG  tablet Take 1 tablet (5 mg total) by mouth daily. 30 tablet 1   flecainide (TAMBOCOR) 50 MG tablet TAKE 1 TABLET(50 MG) BY MOUTH EVERY 12 HOURS 180 tablet 3   LORazepam (ATIVAN) 0.5 MG tablet Take 1 tablet (0.5 mg total) by mouth at bedtime. 30 tablet 2   losartan (COZAAR) 25 MG tablet Take 1 tablet (25 mg total) by mouth daily. 90 tablet 3   metoprolol succinate (TOPROL-XL) 25 MG 24 hr tablet TAKE 1 TABLET(25 MG) BY MOUTH DAILY 90 tablet 3   montelukast (SINGULAIR) 10 MG tablet TAKE 1 TABLET(10 MG) BY MOUTH AT BEDTIME 90 tablet 1   Multiple Vitamin (MULTIVITAMIN WITH MINERALS) TABS tablet Take 1 tablet by mouth daily.     pantoprazole (PROTONIX) 40 MG tablet TAKE 1 TABLET(40 MG) BY MOUTH DAILY 90 tablet 1   potassium chloride SA (KLOR-CON M) 20 MEQ tablet Take 1 tablet (20 mEq total) by mouth 2 (two) times daily. 180 tablet 3   rOPINIRole (REQUIP) 0.5 MG tablet TAKE 1 TABLET(0.5 MG) BY MOUTH THREE TIMES DAILY 270 tablet 2   sertraline (ZOLOFT) 100 MG tablet Take 1.5 tablets (150 mg total) by mouth at bedtime. 135 tablet 0   torsemide (DEMADEX) 20 MG tablet TAKE 3 TABLETS BY MOUTH EVERY DAY 270 tablet 2   traMADol (ULTRAM) 50 MG tablet TAKE 1 TABLET BY MOUTH EVERY 8 HOURS AS NEEDED 90 tablet 5  PLT 213.0 01/29/2023   GLUCOSE 104 (H) 01/29/2023   CHOL 114 01/29/2023   TRIG 126.0 01/29/2023   HDL 28.70 (L) 01/29/2023   LDLDIRECT 91.0 05/16/2018   LDLCALC 61 01/29/2023   ALT 19 02/07/2022   AST 23 02/07/2022   NA 141 01/29/2023   K 3.8 01/29/2023   CL 100 01/29/2023   CREATININE 1.12 01/29/2023   BUN 22 01/29/2023   CO2 33 (H) 01/29/2023   TSH 1.89 01/29/2023   PSA 0.31 06/19/2022   INR 1.6 (H) 12/02/2022   HGBA1C 6.1 01/29/2023   MICROALBUR 0.7 06/19/2022    ECHOCARDIOGRAM COMPLETE  Result Date: 12/03/2022    ECHOCARDIOGRAM REPORT   Patient Name:   [name] Date of Exam: 12/03/2022 Medical Rec #:  [id number]         Height:       72.0 in Accession #:    [id number]        Weight:       381.6 lb Date of Birth:  1946/03/10         BSA:          2.804 m Patient Age:    76 years          BP:           115/97 mmHg Patient Gender: M                 HR:           62 bpm. Exam Location:  Inpatient Procedure: 2D Echo, Cardiac Doppler, Color Doppler and Intracardiac            Opacification Agent Indications:    CHF-Acute Diastolic I50.31  History:        Patient has prior history of Echocardiogram examinations, most                 recent 05/17/2021. CHF, Stroke, Arrythmias:Atrial Fibrillation;                 Risk Factors:Diabetes, Hypertension and Dyslipidemia. Morbid                 Obesity.  Sonographer:    Celesta Gentile RCS Referring Phys: [phone] WHITNEY D HARRIS IMPRESSIONS  1. Left ventricular ejection fraction, by estimation, is 60 to 65%. The left ventricle has normal function. The left ventricle has no regional wall motion  abnormalities. There is mild concentric left ventricular hypertrophy. Left ventricular diastolic parameters are indeterminate.  2. Right ventricular systolic function is mildly reduced. The right ventricular size is moderately enlarged. There is mildly elevated pulmonary artery systolic pressure. The estimated right ventricular systolic pressure is 39.4 mmHg.  3. Left atrial size was moderately dilated.  4. Right atrial size was moderately dilated.  5. The mitral valve is normal in structure. No evidence of mitral valve regurgitation. No evidence of mitral stenosis.  6. The aortic valve is tricuspid. There is mild calcification of the aortic valve. Aortic valve regurgitation is not visualized. Aortic valve sclerosis/calcification is present, without any evidence of aortic stenosis.  7. Aortic dilatation noted. There is mild dilatation of the aortic root, measuring 41 mm.  8. The inferior vena cava is dilated in size with <50% respiratory variability, suggesting right atrial pressure of 15 mmHg. FINDINGS  Left Ventricle: Left ventricular ejection fraction, by estimation, is 60 to 65%. The left ventricle has normal function. The left ventricle has no regional wall motion abnormalities. Definity contrast agent was given IV to  PLT 213.0 01/29/2023   GLUCOSE 104 (H) 01/29/2023   CHOL 114 01/29/2023   TRIG 126.0 01/29/2023   HDL 28.70 (L) 01/29/2023   LDLDIRECT 91.0 05/16/2018   LDLCALC 61 01/29/2023   ALT 19 02/07/2022   AST 23 02/07/2022   NA 141 01/29/2023   K 3.8 01/29/2023   CL 100 01/29/2023   CREATININE 1.12 01/29/2023   BUN 22 01/29/2023   CO2 33 (H) 01/29/2023   TSH 1.89 01/29/2023   PSA 0.31 06/19/2022   INR 1.6 (H) 12/02/2022   HGBA1C 6.1 01/29/2023   MICROALBUR 0.7 06/19/2022    ECHOCARDIOGRAM COMPLETE  Result Date: 12/03/2022    ECHOCARDIOGRAM REPORT   Patient Name:   [name] Date of Exam: 12/03/2022 Medical Rec #:  [id number]         Height:       72.0 in Accession #:    [id number]        Weight:       381.6 lb Date of Birth:  1946/03/10         BSA:          2.804 m Patient Age:    76 years          BP:           115/97 mmHg Patient Gender: M                 HR:           62 bpm. Exam Location:  Inpatient Procedure: 2D Echo, Cardiac Doppler, Color Doppler and Intracardiac            Opacification Agent Indications:    CHF-Acute Diastolic I50.31  History:        Patient has prior history of Echocardiogram examinations, most                 recent 05/17/2021. CHF, Stroke, Arrythmias:Atrial Fibrillation;                 Risk Factors:Diabetes, Hypertension and Dyslipidemia. Morbid                 Obesity.  Sonographer:    Celesta Gentile RCS Referring Phys: [phone] WHITNEY D HARRIS IMPRESSIONS  1. Left ventricular ejection fraction, by estimation, is 60 to 65%. The left ventricle has normal function. The left ventricle has no regional wall motion  abnormalities. There is mild concentric left ventricular hypertrophy. Left ventricular diastolic parameters are indeterminate.  2. Right ventricular systolic function is mildly reduced. The right ventricular size is moderately enlarged. There is mildly elevated pulmonary artery systolic pressure. The estimated right ventricular systolic pressure is 39.4 mmHg.  3. Left atrial size was moderately dilated.  4. Right atrial size was moderately dilated.  5. The mitral valve is normal in structure. No evidence of mitral valve regurgitation. No evidence of mitral stenosis.  6. The aortic valve is tricuspid. There is mild calcification of the aortic valve. Aortic valve regurgitation is not visualized. Aortic valve sclerosis/calcification is present, without any evidence of aortic stenosis.  7. Aortic dilatation noted. There is mild dilatation of the aortic root, measuring 41 mm.  8. The inferior vena cava is dilated in size with <50% respiratory variability, suggesting right atrial pressure of 15 mmHg. FINDINGS  Left Ventricle: Left ventricular ejection fraction, by estimation, is 60 to 65%. The left ventricle has normal function. The left ventricle has no regional wall motion abnormalities. Definity contrast agent was given IV to  Subjective:  Patient ID: [name], male    DOB: 04-Jul-1945  Age: 77 y.o. MRN: [id number]  CC: Anemia, Congestive Heart Failure, Hyperlipidemia, and Diabetes   HPI [name] presents for f/up ----  Discussed the use of AI scribe software for clinical note transcription with the patient, who gave verbal consent to proceed.  History of Present Illness   The patient, with a history of lymphedema, presents with complaints of leg discomfort and impaired sensation. They report that their compression socks, which they find beneficial, tend to slide down due to the size of their legs, leading to circulation cut-off and dark spots on the top of their foot. 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  In the recent past, the patient suffered from sepsis shock due to an undiagnosed infection on their right ankle, which led to a significant weight loss of about thirty pounds. Despite efforts to monitor their diet, they struggle with weight loss.  The patient also reports a history of Pulmonary Arterial Hypertension (PAH) and was advised against exercise due to their condition. They express a desire for honesty and bluntness in their medical care.       Outpatient Medications Prior to Visit  Medication Sig Dispense Refill   atorvastatin (LIPITOR) 80 MG tablet TAKE 1 TABLET(80 MG) BY MOUTH DAILY 90 tablet 1   carboxymethylcellulose (REFRESH PLUS) 0.5 % SOLN Place 1 drop into both eyes 3 (three) times daily as needed (dry eyes).     [facility] MELATONIN GUMMIES PO Take 5 mg by mouth at bedtime.     Docusate Sodium (DSS) 100 MG CAPS Take 100 mg by mouth every other day.     ELIQUIS 5 MG TABS tablet TAKE 1 TABLET(5 MG) BY MOUTH TWICE DAILY 180 tablet 0   empagliflozin (JARDIANCE) 10 MG TABS tablet TAKE 1 TABLET(10 MG) BY MOUTH DAILY BEFORE BREAKFAST 90 tablet 2   finasteride (PROSCAR) 5 MG  tablet Take 1 tablet (5 mg total) by mouth daily. 30 tablet 1   flecainide (TAMBOCOR) 50 MG tablet TAKE 1 TABLET(50 MG) BY MOUTH EVERY 12 HOURS 180 tablet 3   LORazepam (ATIVAN) 0.5 MG tablet Take 1 tablet (0.5 mg total) by mouth at bedtime. 30 tablet 2   losartan (COZAAR) 25 MG tablet Take 1 tablet (25 mg total) by mouth daily. 90 tablet 3   metoprolol succinate (TOPROL-XL) 25 MG 24 hr tablet TAKE 1 TABLET(25 MG) BY MOUTH DAILY 90 tablet 3   montelukast (SINGULAIR) 10 MG tablet TAKE 1 TABLET(10 MG) BY MOUTH AT BEDTIME 90 tablet 1   Multiple Vitamin (MULTIVITAMIN WITH MINERALS) TABS tablet Take 1 tablet by mouth daily.     pantoprazole (PROTONIX) 40 MG tablet TAKE 1 TABLET(40 MG) BY MOUTH DAILY 90 tablet 1   potassium chloride SA (KLOR-CON M) 20 MEQ tablet Take 1 tablet (20 mEq total) by mouth 2 (two) times daily. 180 tablet 3   rOPINIRole (REQUIP) 0.5 MG tablet TAKE 1 TABLET(0.5 MG) BY MOUTH THREE TIMES DAILY 270 tablet 2   sertraline (ZOLOFT) 100 MG tablet Take 1.5 tablets (150 mg total) by mouth at bedtime. 135 tablet 0   torsemide (DEMADEX) 20 MG tablet TAKE 3 TABLETS BY MOUTH EVERY DAY 270 tablet 2   traMADol (ULTRAM) 50 MG tablet TAKE 1 TABLET BY MOUTH EVERY 8 HOURS AS NEEDED 90 tablet 5  Subjective:  Patient ID: [name], male    DOB: 04-Jul-1945  Age: 77 y.o. MRN: [id number]  CC: Anemia, Congestive Heart Failure, Hyperlipidemia, and Diabetes   HPI [name] presents for f/up ----  Discussed the use of AI scribe software for clinical note transcription with the patient, who gave verbal consent to proceed.  History of Present Illness   The patient, with a history of lymphedema, presents with complaints of leg discomfort and impaired sensation. They report that their compression socks, which they find beneficial, tend to slide down due to the size of their legs, leading to circulation cut-off and dark spots on the top of their foot. 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  In the recent past, the patient suffered from sepsis shock due to an undiagnosed infection on their right ankle, which led to a significant weight loss of about thirty pounds. Despite efforts to monitor their diet, they struggle with weight loss.  The patient also reports a history of Pulmonary Arterial Hypertension (PAH) and was advised against exercise due to their condition. They express a desire for honesty and bluntness in their medical care.       Outpatient Medications Prior to Visit  Medication Sig Dispense Refill   atorvastatin (LIPITOR) 80 MG tablet TAKE 1 TABLET(80 MG) BY MOUTH DAILY 90 tablet 1   carboxymethylcellulose (REFRESH PLUS) 0.5 % SOLN Place 1 drop into both eyes 3 (three) times daily as needed (dry eyes).     [facility]S MELATONIN GUMMIES PO Take 5 mg by mouth at bedtime.     Docusate Sodium (DSS) 100 MG CAPS Take 100 mg by mouth every other day.     ELIQUIS 5 MG TABS tablet TAKE 1 TABLET(5 MG) BY MOUTH TWICE DAILY 180 tablet 0   empagliflozin (JARDIANCE) 10 MG TABS tablet TAKE 1 TABLET(10 MG) BY MOUTH DAILY BEFORE BREAKFAST 90 tablet 2   finasteride (PROSCAR) 5 MG  tablet Take 1 tablet (5 mg total) by mouth daily. 30 tablet 1   flecainide (TAMBOCOR) 50 MG tablet TAKE 1 TABLET(50 MG) BY MOUTH EVERY 12 HOURS 180 tablet 3   LORazepam (ATIVAN) 0.5 MG tablet Take 1 tablet (0.5 mg total) by mouth at bedtime. 30 tablet 2   losartan (COZAAR) 25 MG tablet Take 1 tablet (25 mg total) by mouth daily. 90 tablet 3   metoprolol succinate (TOPROL-XL) 25 MG 24 hr tablet TAKE 1 TABLET(25 MG) BY MOUTH DAILY 90 tablet 3   montelukast (SINGULAIR) 10 MG tablet TAKE 1 TABLET(10 MG) BY MOUTH AT BEDTIME 90 tablet 1   Multiple Vitamin (MULTIVITAMIN WITH MINERALS) TABS tablet Take 1 tablet by mouth daily.     pantoprazole (PROTONIX) 40 MG tablet TAKE 1 TABLET(40 MG) BY MOUTH DAILY 90 tablet 1   potassium chloride SA (KLOR-CON M) 20 MEQ tablet Take 1 tablet (20 mEq total) by mouth 2 (two) times daily. 180 tablet 3   rOPINIRole (REQUIP) 0.5 MG tablet TAKE 1 TABLET(0.5 MG) BY MOUTH THREE TIMES DAILY 270 tablet 2   sertraline (ZOLOFT) 100 MG tablet Take 1.5 tablets (150 mg total) by mouth at bedtime. 135 tablet 0   torsemide (DEMADEX) 20 MG tablet TAKE 3 TABLETS BY MOUTH EVERY DAY 270 tablet 2   traMADol (ULTRAM) 50 MG tablet TAKE 1 TABLET BY MOUTH EVERY 8 HOURS AS NEEDED 90 tablet 5

## 2023-01-29 NOTE — Patient Instructions (Signed)

## 2023-02-05 ENCOUNTER — Telehealth: Payer: Self-pay

## 2023-02-05 NOTE — Telephone Encounter (Signed)
Since his lab results show that his anemia has worsen he wants to  know what can he do about it ?

## 2023-02-10 ENCOUNTER — Other Ambulatory Visit: Payer: Self-pay | Admitting: Internal Medicine

## 2023-02-10 DIAGNOSIS — G63 Polyneuropathy in diseases classified elsewhere: Secondary | ICD-10-CM

## 2023-02-10 DIAGNOSIS — D538 Other specified nutritional anemias: Secondary | ICD-10-CM

## 2023-02-10 DIAGNOSIS — D539 Nutritional anemia, unspecified: Secondary | ICD-10-CM

## 2023-02-12 NOTE — Telephone Encounter (Signed)
Patient will come and have his labs drawn at his earliest convenience.

## 2023-02-21 ENCOUNTER — Other Ambulatory Visit: Payer: Self-pay | Admitting: Internal Medicine

## 2023-02-21 ENCOUNTER — Encounter: Payer: Self-pay | Admitting: Internal Medicine

## 2023-02-21 DIAGNOSIS — E118 Type 2 diabetes mellitus with unspecified complications: Secondary | ICD-10-CM

## 2023-02-21 MED ORDER — TIRZEPATIDE 2.5 MG/0.5ML ~~LOC~~ SOAJ: 2.5000 mg | SUBCUTANEOUS | 0 refills | Status: DC

## 2023-03-12 ENCOUNTER — Encounter (HOSPITAL_COMMUNITY): Payer: Self-pay | Admitting: Psychiatry

## 2023-03-12 ENCOUNTER — Telehealth (HOSPITAL_BASED_OUTPATIENT_CLINIC_OR_DEPARTMENT_OTHER): Payer: Medicare Other | Admitting: Psychiatry

## 2023-03-12 VITALS — Wt 349.0 lb

## 2023-03-12 DIAGNOSIS — F419 Anxiety disorder, unspecified: Secondary | ICD-10-CM

## 2023-03-12 DIAGNOSIS — F330 Major depressive disorder, recurrent, mild: Secondary | ICD-10-CM

## 2023-03-12 MED ORDER — SERTRALINE HCL 100 MG PO TABS: 150.0000 mg | ORAL_TABLET | Freq: Every day | ORAL | 0 refills | Status: DC

## 2023-03-12 MED ORDER — LORAZEPAM 0.5 MG PO TABS: 0.5000 mg | ORAL_TABLET | Freq: Every day | ORAL | 2 refills | Status: DC
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
## 2023-03-22 ENCOUNTER — Other Ambulatory Visit: Payer: Self-pay | Admitting: Internal Medicine

## 2023-03-22 DIAGNOSIS — K2100 Gastro-esophageal reflux disease with esophagitis, without bleeding: Secondary | ICD-10-CM

## 2023-03-22 DIAGNOSIS — E785 Hyperlipidemia, unspecified: Secondary | ICD-10-CM

## 2023-03-22 DIAGNOSIS — H1010 Acute atopic conjunctivitis, unspecified eye: Secondary | ICD-10-CM

## 2023-03-22 DIAGNOSIS — E118 Type 2 diabetes mellitus with unspecified complications: Secondary | ICD-10-CM

## 2023-03-29 ENCOUNTER — Telehealth (HOSPITAL_BASED_OUTPATIENT_CLINIC_OR_DEPARTMENT_OTHER): Payer: Self-pay | Admitting: Internal Medicine

## 2023-03-30 MED ORDER — ROPINIROLE HCL 0.5 MG PO TABS: ORAL_TABLET | ORAL | 4 refills | Status: AC

## 2023-03-30 NOTE — Telephone Encounter (Signed)
PT calling again for RX. States he is out and wants it for the weekend. Pharm has been requesting too.

## 2023-03-30 NOTE — Telephone Encounter (Signed)
Requip refilled 

## 2023-03-30 NOTE — Telephone Encounter (Signed)
[name]- pt is scheduled for appt with you in March 2025  He asks for requip refill  Please advise, thanks!

## 2023-03-30 NOTE — Telephone Encounter (Signed)
Spoke with the pt and notified that the rx was sent  Nothing further needed

## 2023-04-02 ENCOUNTER — Telehealth (HOSPITAL_COMMUNITY): Payer: Self-pay | Admitting: *Deleted

## 2023-04-02 DIAGNOSIS — F330 Major depressive disorder, recurrent, mild: Secondary | ICD-10-CM

## 2023-04-02 MED ORDER — SERTRALINE HCL 100 MG PO TABS: 200.0000 mg | ORAL_TABLET | Freq: Every day | ORAL | 0 refills | Status: DC

## 2023-04-02 NOTE — Telephone Encounter (Signed)
Patient was contacted via telephone for approximately 15 minutes,  Back in 2006 he had dealt with serious depression and voluntarily went to [facility]. He had gone through several months of medications changes before stabilizing with his current medication regimen. Up until recently things have been working very well. The holidays though have always been a more difficult time. This year he went to a family gathering on christmas day and saw his 13 year old granddaughter who is being taught who that she is in control of who can touch her. With this she would hug everyone there besides him. Hessie Diener notes that this really bothered him and found himself getting angry like he had in the past. Patient denied any SI/HI. He does identify that he has had a stroke with some long term physical complications. Due to these he has not been able to be as mobile or go see his granddaughter as frequently as he would like or as often as the rest of the family.  We discussed recent changes and patient notes starting Monjaro in November. We reviewed how this medication can negatively impact the absorption of the Sertraline. Hessie Diener does identify that he has felt a bit more mood lability with anger and depression since starting the [facility].  We recommended increasing Zoloft to 200 mg at bedtime and reviewed the risks and benefits. If he does not notice improvement in a couple months, or if another episode happens he will reach out about other medications options or therapy.

## 2023-04-02 NOTE — Telephone Encounter (Signed)
Pt of Dr. Sheela Stack called stating that he needs a "medication adjustment". Pt states that he has become more and more depressed and is now feeling very angry and irritable. Pt denies outbursts but says his mood is definitely affected and christmas was particularly difficult. Pt denies SI or HI. He has been on the same meds for years he says and just thinks it's time for dose adjustment he says. Pt last visit was on 03/12/23 and he has a f/u scheduled for 06/11/22. Please review and advise.

## 2023-04-11 ENCOUNTER — Other Ambulatory Visit: Payer: Self-pay | Admitting: Internal Medicine

## 2023-04-11 DIAGNOSIS — I480 Paroxysmal atrial fibrillation: Secondary | ICD-10-CM

## 2023-04-16 ENCOUNTER — Ambulatory Visit: Payer: Medicare Other | Admitting: Internal Medicine

## 2023-04-20 ENCOUNTER — Telehealth: Payer: Self-pay | Admitting: Physical Medicine & Rehabilitation

## 2023-04-20 NOTE — Telephone Encounter (Signed)
Patient called in states he started having lower back pain that started 4 days ago and the tramadol is not currently helping him states its a different type of pain. When bending it makes it worse. Patient requested to schedule an appointment with Dr Wynn Banker , scheduled a follow up for 05/03/23 but would like advise on what can be done

## 2023-04-21 ENCOUNTER — Other Ambulatory Visit: Payer: Self-pay | Admitting: Internal Medicine

## 2023-04-21 DIAGNOSIS — E118 Type 2 diabetes mellitus with unspecified complications: Secondary | ICD-10-CM

## 2023-04-21 DIAGNOSIS — G4733 Obstructive sleep apnea (adult) (pediatric): Secondary | ICD-10-CM

## 2023-04-21 MED ORDER — TIRZEPATIDE 5 MG/0.5ML ~~LOC~~ SOAJ: 5.0000 mg | SUBCUTANEOUS | 0 refills | Status: DC

## 2023-04-23 ENCOUNTER — Telehealth: Payer: Self-pay | Admitting: Registered Nurse

## 2023-04-23 MED ORDER — METHOCARBAMOL 500 MG PO TABS: 500.0000 mg | ORAL_TABLET | Freq: Three times a day (TID) | ORAL | 0 refills | Status: DC | PRN

## 2023-04-23 NOTE — Telephone Encounter (Signed)
Dr Doroteo Bradford Recommended Methocarbamol 500 mg TID as needed for muscle spasm.  Call placed to [name] regarding the above, he verbalizes understanding.  He was instructed to call office with update on his pain, he verbalizes understanding.

## 2023-04-24 NOTE — Telephone Encounter (Signed)
Pharmacy stated methocarbamol is not covered, asking if tizanidine hcl can be sent in instead

## 2023-04-25 ENCOUNTER — Other Ambulatory Visit: Payer: Self-pay | Admitting: Internal Medicine

## 2023-04-25 DIAGNOSIS — M1A09X0 Idiopathic chronic gout, multiple sites, without tophus (tophi): Secondary | ICD-10-CM

## 2023-04-26 ENCOUNTER — Telehealth: Payer: Self-pay | Admitting: Registered Nurse

## 2023-04-26 MED ORDER — METHOCARBAMOL 500 MG PO TABS: 500.0000 mg | ORAL_TABLET | Freq: Three times a day (TID) | ORAL | 0 refills | Status: DC | PRN

## 2023-04-26 NOTE — Telephone Encounter (Signed)
Placed a call to Mr. Mcsween, we discussed Methocarbamol and [facility]. He will pay cash for the Methocarbamol. He will call if a problem arises. He verbalizes understanding.

## 2023-04-30 ENCOUNTER — Ambulatory Visit (INDEPENDENT_AMBULATORY_CARE_PROVIDER_SITE_OTHER): Payer: Medicare Other

## 2023-04-30 ENCOUNTER — Telehealth: Payer: Self-pay

## 2023-04-30 VITALS — Ht 72.0 in | Wt 337.0 lb

## 2023-04-30 DIAGNOSIS — Z0000 Encounter for general adult medical examination without abnormal findings: Secondary | ICD-10-CM

## 2023-04-30 NOTE — Telephone Encounter (Signed)
Recommend to see pharmacist [phone] under pcp name if patient willing

## 2023-04-30 NOTE — Telephone Encounter (Signed)
Patient has concerns about having a hard time with the cost of his medications.  Patient explains that it is very expensive and he can not continue to pay for all medications.

## 2023-04-30 NOTE — Progress Notes (Signed)
Subjective:   [name] is a 78 y.o. male who presents for Medicare Annual/Subsequent preventive examination.  Visit Complete: Virtual I connected with  [name] on 04/30/23 by a audio enabled telemedicine application and verified that I am speaking with the correct person using two identifiers.  Patient Location: Home  Provider Location: Office/Clinic  I discussed the limitations of evaluation and management by telemedicine. The patient expressed understanding and agreed to proceed.  Vital Signs: Because this visit was a virtual/telehealth visit, some criteria may be missing or patient reported. Any vitals not documented were not able to be obtained and vitals that have been documented are patient reported.   Cardiac Risk Factors include: advanced age (>50men, >59 women);male gender;hypertension;Other (see comment);dyslipidemia, Risk factor comments: CHF, PAF, OSA, BPH, Atherosclerosis of aorta     Objective:    Today's Vitals   04/30/23 1320 04/30/23 1423  Weight: (!) 337 lb (152.9 kg)   Height: 6' (1.829 m)   PainSc:  6    Body mass index is 45.71 kg/m.     04/30/2023    2:38 PM 12/08/2022    3:37 PM 12/02/2022    8:44 AM 04/25/2022    2:03 PM 05/12/2021    8:22 AM 12/16/2020    3:49 PM 11/02/2020    7:18 PM  Advanced Directives  Does Patient Have a Medical Advance Directive? No  No No No No No  Would patient like information on creating a medical advance directive?  No - Patient declined  No - Patient declined No - Patient declined No - Patient declined No - Patient declined    Current Medications (verified) Outpatient Encounter Medications as of 04/30/2023  Medication Sig   allopurinol (ZYLOPRIM) 100 MG tablet TAKE 1 TABLET(100 MG) BY MOUTH DAILY   atorvastatin (LIPITOR) 80 MG tablet TAKE 1 TABLET(80 MG) BY MOUTH DAILY   carboxymethylcellulose (REFRESH PLUS) 0.5 % SOLN Place 1 drop into both eyes 3 (three) times daily as needed (dry eyes).   [facility]  MELATONIN GUMMIES PO Take 5 mg by mouth at bedtime.   Docusate Sodium (DSS) 100 MG CAPS Take 100 mg by mouth every other day.   ELIQUIS 5 MG TABS tablet TAKE 1 TABLET(5 MG) BY MOUTH TWICE DAILY   empagliflozin (JARDIANCE) 10 MG TABS tablet TAKE 1 TABLET(10 MG) BY MOUTH DAILY BEFORE BREAKFAST   finasteride (PROSCAR) 5 MG tablet Take 1 tablet (5 mg total) by mouth daily.   flecainide (TAMBOCOR) 50 MG tablet TAKE 1 TABLET(50 MG) BY MOUTH EVERY 12 HOURS   LORazepam (ATIVAN) 0.5 MG tablet Take 1 tablet (0.5 mg total) by mouth at bedtime.   losartan (COZAAR) 25 MG tablet Take 1 tablet (25 mg total) by mouth daily.   methocarbamol (ROBAXIN) 500 MG tablet Take 1 tablet (500 mg total) by mouth 3 (three) times daily as needed for muscle spasms.   metoprolol succinate (TOPROL-XL) 25 MG 24 hr tablet TAKE 1 TABLET(25 MG) BY MOUTH DAILY   montelukast (SINGULAIR) 10 MG tablet TAKE 1 TABLET(10 MG) BY MOUTH AT BEDTIME   Multiple Vitamin (MULTIVITAMIN WITH MINERALS) TABS tablet Take 1 tablet by mouth daily.   pantoprazole (PROTONIX) 40 MG tablet TAKE 1 TABLET(40 MG) BY MOUTH DAILY   potassium chloride SA (KLOR-CON M) 20 MEQ tablet TAKE 1 TABLET(20 MEQ) BY MOUTH TWICE DAILY   rOPINIRole (REQUIP) 0.5 MG tablet TAKE 1 TABLET(0.5 MG) BY MOUTH THREE TIMES DAILY   sertraline (ZOLOFT) 100 MG tablet Take 2 tablets (  200 mg total) by mouth at bedtime.   tirzepatide [facility]) 5 MG/0.5ML Pen Inject 5 mg into the skin once a week.   torsemide (DEMADEX) 20 MG tablet TAKE 3 TABLETS BY MOUTH EVERY DAY   traMADol (ULTRAM) 50 MG tablet TAKE 1 TABLET BY MOUTH EVERY 8 HOURS AS NEEDED   zinc gluconate 50 MG tablet Take 1 tablet (50 mg total) by mouth daily.   No facility-administered encounter medications on file as of 04/30/2023.    Allergies (verified) Morphine, Penicillins, Codeine, and Propoxyphene n-acetaminophen   History: Past Medical History:  Diagnosis Date   Anemia 07/29/2013   Asthma    childhood asthma   Benign  essential HTN    Benign prostatic hypertrophy    several prostate biopsies neg for cancer.    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   FH: colonic polyps 2010   Gout    History of kidney stones    Hypertension    Kidney cysts    Morbid obesity (HCC)    Numbness    feet / legs   PAF (paroxysmal atrial fibrillation) with RVR 07/29/2013   Pericarditis 2015   s/p window   Recurrent pulmonary emboli (HCC) 06/13/2015   Sleep apnea    USES C PAP   Stroke syndrome    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   Past Surgical History:  Procedure Laterality Date   ARTHROSCOPY KNEE W/ DRILLING     BREATH TEK H PYLORI N/A 12/23/2012   Procedure: BREATH TEK H PYLORI;  Surgeon: Atilano Ina, MD;  Location: [facility] ENDOSCOPY;  Service: General;  Laterality: N/A;   CHOLECYSTECTOMY  1989   COLONOSCOPY N/A 07/31/2013   Procedure: COLONOSCOPY;  Surgeon: Iva Boop, MD;  Location: [facility] ENDOSCOPY;  Service: Endoscopy;  Laterality: N/A;   ESOPHAGOGASTRODUODENOSCOPY N/A 07/22/2013   Procedure: ESOPHAGOGASTRODUODENOSCOPY (EGD);  Surgeon: Hilarie Fredrickson, MD;  Location: [facility] ENDOSCOPY;  Service: Endoscopy;  Laterality: N/A;   ESOPHAGOGASTRODUODENOSCOPY N/A 07/31/2013   Procedure: ESOPHAGOGASTRODUODENOSCOPY (EGD);  Surgeon: Iva Boop, MD;  Location: [facility] ENDOSCOPY;  Service: Endoscopy;  Laterality: N/A;   INTRAOPERATIVE TRANSESOPHAGEAL ECHOCARDIOGRAM N/A 07/18/2013   Procedure: INTRAOPERATIVE TRANSESOPHAGEAL ECHOCARDIOGRAM;  Surgeon: Delight Ovens, MD;  Location: [facility] OR;  Service: Open Heart Surgery;  Laterality: N/A;   LAPAROSCOPIC GASTRIC BANDING WITH HIATAL HERNIA REPAIR N/A 04/08/2013   Procedure:  LAPAROSCOPIC GASTRIC BANDING WITH possible  HIATAL HERNIA REPAIR;  Surgeon: Atilano Ina, MD;  Location: [facility] ORS;  Service: General;  Laterality: N/A;   LITHOTRIPSY     renal calculi     RIGHT AND LEFT HEART CATH N/A 05/12/2021   Procedure: RIGHT AND LEFT HEART CATH;  Surgeon: Dolores Patty, MD;  Location: [facility] INVASIVE CV LAB;  Service: Cardiovascular;  Laterality: N/A;   SUBXYPHOID PERICARDIAL WINDOW N/A 07/18/2013   Procedure: SUBXYPHOID PERICARDIAL WINDOW;  Surgeon: Delight Ovens, MD;  Location: [facility] OR;  Service: Thoracic;  Laterality: N/A;   TONSILLECTOMY     total knee replacment     bilat   trigger finger repair     also lue, cts surgically repaired   WISDOM TOOTH EXTRACTION  Family History  Problem Relation Age of Onset   Depression Mother    Hypertension Mother    Dementia Mother    Heart disease Father        MI   Depression Brother    Stroke Paternal Aunt        CVA   Diabetes Paternal Uncle    Heart disease Paternal Uncle        MI   Heart disease Paternal Grandmother        MI   Diabetes Cousin        PATERNAL   Social History   Socioeconomic History   Marital status: Married    Spouse name: Educational psychologist   Number of children: 1   Years of education: Not on file   Highest education level: Not on file  Occupational History   Occupation: retired-Manager [employer]  Tobacco Use   Smoking status: Former    Current packs/day: 0.00    Average packs/day: 0.2 packs/day for 13.4 years (2.7 ttl pk-yrs)    Types: Cigarettes    Start date: 04/03/1961    Quit date: 08/30/1974    Years since quitting: 48.6   Smokeless tobacco: Never  Vaping Use   Vaping status: Never Used  Substance and Sexual Activity   Alcohol use: No    Alcohol/week: 0.0 standard drinks of alcohol   Drug use: No   Sexual activity: Not Currently  Other Topics Concern   Not on file  Social History Narrative   Lives at home w/ his wife   Right-handed   Caffeine: decaf coffee, occasional tea    Social Drivers of Corporate investment banker Strain: High Risk (04/30/2023)   Overall Financial Resource Strain (CARDIA)    Difficulty of Paying Living Expenses: Hard  Food Insecurity: No Food Insecurity (04/30/2023)   Hunger Vital Sign    Worried About Running Out of Food in the Last Year: Never true    Ran Out of Food in the Last Year: Never true  Transportation Needs: No Transportation Needs (04/30/2023)   PRAPARE - Administrator, Civil Service (Medical): No    Lack of Transportation (Non-Medical): No  Physical Activity: Inactive (04/30/2023)   Exercise Vital Sign    Days of Exercise per Week: 0 days    Minutes of Exercise per Session: 0 min  Stress: No Stress Concern Present (04/30/2023)   [employer] of Occupational Health - Occupational Stress Questionnaire    Feeling of Stress : Not at all  Social Connections: Moderately Isolated (04/30/2023)   Social Connection and Isolation Panel [NHANES]    Frequency of Communication with Friends and Family: More than three times a week    Frequency of Social Gatherings with Friends and Family: Never    Attends Religious Services: Never    Database administrator or Organizations: No    Attends Engineer, structural: Never    Marital Status: Married    Tobacco Counseling Counseling given: Not Answered   Clinical Intake:  Pre-visit preparation completed: Yes  Pain : 0-10 Pain Score: 6  Pain Type: Chronic pain Pain Location: Back Pain Orientation: Lower Pain Descriptors / Indicators: Aching, Discomfort Pain Onset: More than a month ago Pain Frequency: Intermittent Pain Relieving Factors: Tramadol, Methocarbamol  Pain Relieving Factors: Tramadol, Methocarbamol  BMI - recorded: 45.71 Nutritional Status: BMI > 30  Obese Nutritional Risks: None Diabetes: No CBG done?: No Did pt. bring in CBG monitor from home?: No  How  often do you need to have someone help you when you read instructions, pamphlets,  or other written materials from your doctor or pharmacy?: 1 - Never     Information entered by :: Paije Goodhart, RMA   Activities of Daily Living    04/30/2023    1:21 PM 12/08/2022    3:13 PM  In your present state of health, do you have any difficulty performing the following activities:  Hearing? 1 1  Comment wears hearing aides   Vision? 0 0  Difficulty concentrating or making decisions? 0 0  Walking or climbing stairs? 1 0  Dressing or bathing? 1 1  Comment putting on socks   Doing errands, shopping? 0 1  Preparing Food and eating ? N   Using the Toilet? N   In the past six months, have you accidently leaked urine? Y   Do you have problems with loss of bowel control? N   Managing your Medications? N   Managing your Finances? N   Housekeeping or managing your Housekeeping? N     Patient Care Team: [name], MD as PCP - General (Internal Medicine) Rennis Golden Lisette Abu, MD as PCP - Cardiology (Cardiology) Himmelrich, Loree Fee, RD (Inactive) as Dietitian Jae Dire) Newman Pies, MD as Consulting Physician (Otolaryngology) Cleotis Nipper, MD as Consulting Physician (Psychiatry) Rennis Golden Lisette Abu, MD as Consulting Physician (Cardiology) Debroah Loop, DO (Otolaryngology) Waymon Budge, MD as Consulting Physician (Pulmonary Disease) Pa, [facility] as Consulting Physician (Ophthalmology) Erlene Quan Vinnie Level, [facility] (Inactive) (Pharmacist)  Indicate any recent Medical Services you may have received from other than [facility] providers in the past year (date may be approximate).     Assessment:   This is a routine wellness examination for [place].  Hearing/Vision screen Hearing Screening - Comments:: Denies hearing difficulties   Vision Screening - Comments:: Wears eyeglasses   Goals Addressed   None   Depression Screen    04/30/2023    2:45 PM 01/29/2023    1:31 PM 10/10/2022    3:04 PM 06/19/2022    2:24 PM 04/25/2022    1:56 PM 05/24/2021    2:11 PM  12/16/2020    4:15 PM  PHQ 2/9 Scores  PHQ - 2 Score 0 0 0 0 0 2 0  PHQ- 9 Score 1 2 0   2     Fall Risk    04/30/2023    2:38 PM 01/29/2023    1:31 PM 06/19/2022    2:23 PM 04/25/2022    2:01 PM 01/27/2022   12:58 PM  Fall Risk   Falls in the past year? 1 1 1 1 1   Number falls in past yr: 1 1 1 1 1   Injury with Fall? 1 1 0 0 0  Risk for fall due to :  History of fall(s) Other (Comment) Impaired mobility   Follow up Falls evaluation completed;Falls prevention discussed Falls evaluation completed Falls evaluation completed Education provided;Falls prevention discussed     MEDICARE RISK AT HOME: Medicare Risk at Home Any stairs in or around the home?: Yes If so, are there any without handrails?: Yes Home free of loose throw rugs in walkways, pet beds, electrical cords, etc?: Yes Adequate lighting in your home to reduce risk of falls?: Yes Life alert?: No Use of a cane, walker or w/c?: Yes Grab bars in the bathroom?: Yes Shower chair or bench in shower?: Yes Elevated toilet seat or a handicapped toilet?: Yes  TIMED UP AND  GO:  Was the test performed?  No    Cognitive Function:        04/30/2023    1:22 PM 04/25/2022    2:03 PM 10/27/2019    3:25 PM  6CIT Screen  What Year? 0 points 0 points 0 points  What month? 0 points 0 points 0 points  What time? 0 points 0 points 0 points  Count back from 20 0 points 0 points 0 points  Months in reverse 0 points 0 points 2 points  Repeat phrase 0 points 0 points 2 points  Total Score 0 points 0 points 4 points    Immunizations Immunization History  Administered Date(s) Administered   Fluad Quad(high Dose 65+) 12/11/2018, 02/18/2021, 02/07/2022   Fluad Trivalent(High Dose 65+) 01/29/2023   Influenza, High Dose Seasonal PF 01/20/2016, 01/31/2017, 03/06/2018   Influenza,inj,Quad PF,6+ Mos 04/09/2013   Influenza-Unspecified 03/08/2018, 03/02/2020   PFIZER(Purple Top)SARS-COV-2 Vaccination 05/10/2019, 06/04/2019, 03/02/2020,  02/18/2021   Pneumococcal Conjugate-13 10/20/2015   Pneumococcal Polysaccharide-23 04/09/2013, 10/27/2019   Td 11/02/2009   Tdap 01/20/2016    TDAP status: Up to date  Flu Vaccine status: Up to date  Pneumococcal vaccine status: Up to date  Covid-19 vaccine status: Declined, Education has been provided regarding the importance of this vaccine but patient still declined. Advised may receive this vaccine at local pharmacy or Health Dept.or vaccine clinic. Aware to provide a copy of the vaccination record if obtained from local pharmacy or Health Dept. Verbalized acceptance and understanding.  Qualifies for Shingles Vaccine? Yes   Zostavax completed No   Shingrix Completed?: No.    Education has been provided regarding the importance of this vaccine. Patient has been advised to call insurance company to determine out of pocket expense if they have not yet received this vaccine. Advised may also receive vaccine at local pharmacy or Health Dept. Verbalized acceptance and understanding.  Screening Tests Health Maintenance  Topic Date Due   OPHTHALMOLOGY EXAM  01/05/2022   COVID-19 Vaccine (5 - 2024-25 season) 12/03/2022   Zoster Vaccines- Shingrix (1 of 2) 05/01/2023 (Originally 12/21/1964)   Diabetic kidney evaluation - Urine ACR  06/19/2023   HEMOGLOBIN A1C  07/30/2023   Diabetic kidney evaluation - eGFR measurement  01/29/2024   FOOT EXAM  01/29/2024   Medicare Annual Wellness (AWV)  04/29/2024   DTaP/Tdap/Td (3 - Td or Tdap) 01/19/2026   Pneumonia Vaccine 39+ Years old  Completed   INFLUENZA VACCINE  Completed   Hepatitis C Screening  Completed   HPV VACCINES  Aged Out   Colonoscopy  Discontinued    Health Maintenance  Health Maintenance Due  Topic Date Due   OPHTHALMOLOGY EXAM  01/05/2022   COVID-19 Vaccine (5 - 2024-25 season) 12/03/2022    Colorectal cancer screening: No longer required.   Lung Cancer Screening: (Low Dose CT Chest recommended if Age 56-80 years, 20  pack-year currently smoking OR have quit w/in 15years.) does not qualify.   Lung Cancer Screening Referral: N/A  Additional Screening:  Hepatitis C Screening: does qualify; Completed 07/29/2013  Vision Screening: Recommended annual ophthalmology exams for early detection of glaucoma and other disorders of the eye. Is the patient up to date with their annual eye exam?  No  Who is the provider or what is the name of the office in which the patient attends annual eye exams? [facility] If pt is not established with a provider, would they like to be referred to a provider to establish care? No .  Dental Screening: Recommended annual dental exams for proper oral hygiene  Diabetic Foot Exam: Diabetic Foot Exam: Completed 01/29/2023  Community Resource Referral / Chronic Care Management: CRR required this visit?  No   CCM required this visit?  PCP informed of CCM need     Plan:     I have personally reviewed and noted the following in the patient's chart:   Medical and social history Use of alcohol, tobacco or illicit drugs  Current medications and supplements including opioid prescriptions. Patient is not currently taking opioid prescriptions. Functional ability and status Nutritional status Physical activity Advanced directives List of other physicians Hospitalizations, surgeries, and ER visits in previous 12 months Vitals Screenings to include cognitive, depression, and falls Referrals and appointments  In addition, I have reviewed and discussed with patient certain preventive protocols, quality metrics, and best practice recommendations. A written personalized care plan for preventive services as well as general preventive health recommendations were provided to patient.     Mendel Binsfeld L Zada Haser, CMA   04/30/2023   After Visit Summary: (MyChart) Due to this being a telephonic visit, the after visit summary with patients personalized plan was offered to patient via  MyChart   Nurse Notes: Patient is due for a Shingrix vaccine and diabetic eye exam.  He stated that he has had a colonoscopy after 2015, however I do not see documentation of it in his chart.  Patient will let us know when/if he had the colonoscopy once he looks in his records.  Patient has some concerns today about having a hard time with the cost of his medications.  Message has been sent to provider for care management.

## 2023-04-30 NOTE — Patient Instructions (Addendum)
[name] , Thank you for taking time to come for your Medicare Wellness Visit. I appreciate your ongoing commitment to your health goals. Please review the following plan we discussed and let me know if I can assist you in the future.   Referrals/Orders/Follow-Ups/Clinician Recommendations: It was nice talking to you today.  You are due for a Shingle vaccine, a colonoscopy and an eye exam.  Please let office know via Mychart if you have had any Shingles vaccines or a more recent colonoscopy.    Keep up the good work.  This is a list of the screening recommended for you and due dates:  Health Maintenance  Topic Date Due   Eye exam for diabetics  01/05/2022   COVID-19 Vaccine (5 - 2024-25 season) 12/03/2022   Medicare Annual Wellness Visit  04/26/2023   Zoster (Shingles) Vaccine (1 of 2) 05/01/2023*   Yearly kidney health urinalysis for diabetes  06/19/2023   Hemoglobin A1C  07/30/2023   Yearly kidney function blood test for diabetes  01/29/2024   Complete foot exam   01/29/2024   DTaP/Tdap/Td vaccine (3 - Td or Tdap) 01/19/2026   Pneumonia Vaccine  Completed   Flu Shot  Completed   Hepatitis C Screening  Completed   HPV Vaccine  Aged Out   Colon Cancer Screening  Discontinued  *Topic was postponed. The date shown is not the original due date.    Advanced directives: (Copy Requested) Please bring a copy of your health care power of attorney and living will to the office to be added to your chart at your convenience.  Next Medicare Annual Wellness Visit scheduled for next year: Yes

## 2023-05-01 NOTE — Addendum Note (Signed)
Addended by: Wyvonne Lenz on: 05/01/2023 08:06 AM   Modules accepted: Orders

## 2023-05-03 ENCOUNTER — Encounter: Payer: Medicare Other | Attending: Physical Medicine & Rehabilitation | Admitting: Physical Medicine & Rehabilitation

## 2023-05-03 ENCOUNTER — Encounter: Payer: Self-pay | Admitting: Physical Medicine & Rehabilitation

## 2023-05-03 VITALS — BP 126/71 | HR 63 | Ht 72.0 in | Wt 337.0 lb

## 2023-05-03 DIAGNOSIS — M48061 Spinal stenosis, lumbar region without neurogenic claudication: Secondary | ICD-10-CM | POA: Diagnosis not present

## 2023-05-03 DIAGNOSIS — G63 Polyneuropathy in diseases classified elsewhere: Secondary | ICD-10-CM | POA: Diagnosis not present

## 2023-05-03 NOTE — Progress Notes (Signed)
Subjective:    Patient ID: [name], male    DOB: 09-17-1945, 78 y.o.   MRN: [id number]  HPI  78 year old male with history of pulmonary artery hypertension, morbid obesity as well as lumbar spinal stenosis.  He is no longer having any neurogenic claudication symptoms or sciatic symptoms.  He does have numbness in both feet Functional status is limited.  He ambulates short distances to get to the bathroom.  He needs to carry his oxygen and uses a cane.  He is mostly in his scooter.  He would like to exercise his legs to build up some strength.  We discussed exercising to the point of feeling short winded  MRI LUMBAR SPINE WITHOUT CONTRAST   TECHNIQUE: Multiplanar, multisequence MR imaging of the lumbar spine was performed. No intravenous contrast was administered.   COMPARISON:  Lumbar radiographs 11/07/2016.   FINDINGS: Segmentation:  Normal on the comparison radiographs.   Alignment: Mild retrolisthesis of L4 on L5 measures up to 5 millimeters and is new from the prior radiographs. Stable straightening of lumbar lordosis otherwise.   Vertebrae: Benign vertebral body hemangioma in L4. No marrow edema or evidence of acute osseous abnormality. Normal background bone marrow signal. Intact visible sacrum and SI joints.   Conus medullaris and cauda equina: Conus extends to the L1 level. No lower spinal cord or conus signal abnormality. Lipoma of the filum terminalis, normal variant (series 9, image 11).   Paraspinal and other soft tissues: Partially visible benign appearing renal cysts. Negative visible other abdominal viscera. Negative visualized posterior paraspinal soft tissues.   Disc levels:   T11-T12: Partially visible, negative.   T12-L1:  Minimal disc bulge.   L1-L2:  Negative.   L2-L3:  Minimal disc bulge.  Mild facet hypertrophy.  No stenosis.   L3-L4: Mostly far lateral mild disc bulging with endplate spurring. Mild to moderate facet hypertrophy. No  spinal or lateral recess stenosis. Borderline to mild bilateral L3 foraminal stenosis.   L4-L5: Chronic disc space loss. Retrolisthesis with circumferential disc osteophyte complex. Mild to moderate facet hypertrophy. No significant spinal stenosis. Mild bilateral lateral recess stenosis (descending L5 nerve levels). Superimposed left foraminal to far lateral disc protrusion (series 5, image 14). Moderate to severe left and mild to moderate right L4 foraminal stenosis.   L5-S1: Negative disc. Mild to moderate facet hypertrophy. No stenosis.   IMPRESSION: 1. L4-L5 advanced disc and endplate degeneration, and moderate posterior element degeneration where mild retrolisthesis appears increased since 2018. Moderate to severe L4 foraminal stenosis is greater on the left. Mild bilateral L5 nerve level lateral recess stenosis. 2. Mild for age lumbar spine degeneration elsewhere with no other convincing neural impingement.     Electronically Signed   By: Odessa Fleming M.D.   On: 10/14/2018 03:29 Jan 2022 last visit   Takes Tramadol 50mg  TID  Had methocarbamol   Pain Inventory Average Pain 2 Pain Right Now 2 My pain is intermittent  In the last 24 hours, has pain interfered with the following? General activity 5 Relation with others 0 Enjoyment of life 5 What TIME of day is your pain at its worst? varies Sleep (in general) Fair  Pain is worse with: walking, bending, sitting, standing, and some activites Pain improves with: rest and medication Relief from Meds: 3  Family History  Problem Relation Age of Onset   Depression Mother    Hypertension Mother    Dementia Mother    Heart disease Father  MI   Depression Brother    Stroke Paternal Aunt        CVA   Diabetes Paternal Uncle    Heart disease Paternal Uncle        MI   Heart disease Paternal Grandmother        MI   Diabetes Cousin        PATERNAL   Social History   Socioeconomic History   Marital  status: Married    Spouse name: Educational psychologist   Number of children: 1   Years of education: Not on file   Highest education level: Not on file  Occupational History   Occupation: retired-Manager [employer]  Tobacco Use   Smoking status: Former    Current packs/day: 0.00    Average packs/day: 0.2 packs/day for 13.4 years (2.7 ttl pk-yrs)    Types: Cigarettes    Start date: 04/03/1961    Quit date: 08/30/1974    Years since quitting: 48.7   Smokeless tobacco: Never  Vaping Use   Vaping status: Never Used  Substance and Sexual Activity   Alcohol use: No    Alcohol/week: 0.0 standard drinks of alcohol   Drug use: No   Sexual activity: Not Currently  Other Topics Concern   Not on file  Social History Narrative   Lives at home w/ his wife   Right-handed   Caffeine: decaf coffee, occasional tea   Social Drivers of Corporate investment banker Strain: High Risk (04/30/2023)   Overall Financial Resource Strain (CARDIA)    Difficulty of Paying Living Expenses: Hard  Food Insecurity: No Food Insecurity (04/30/2023)   Hunger Vital Sign    Worried About Running Out of Food in the Last Year: Never true    Ran Out of Food in the Last Year: Never true  Transportation Needs: No Transportation Needs (04/30/2023)   PRAPARE - Administrator, Civil Service (Medical): No    Lack of Transportation (Non-Medical): No  Physical Activity: Inactive (04/30/2023)   Exercise Vital Sign    Days of Exercise per Week: 0 days    Minutes of Exercise per Session: 0 min  Stress: No Stress Concern Present (04/30/2023)   [employer] of Occupational Health - Occupational Stress Questionnaire    Feeling of Stress : Not at all  Social Connections: Moderately Isolated (04/30/2023)   Social Connection and Isolation Panel [NHANES]    Frequency of Communication with Friends and Family: More than three times a week    Frequency of Social Gatherings with Friends and Family: Never    Attends Religious Services:  Never    Database administrator or Organizations: No    Attends Engineer, structural: Never    Marital Status: Married   Past Surgical History:  Procedure Laterality Date   ARTHROSCOPY KNEE W/ DRILLING     BREATH TEK H PYLORI N/A 12/23/2012   Procedure: BREATH TEK H PYLORI;  Surgeon: Atilano Ina, MD;  Location: [facility] END[facility]COPY;  Service: General;  Laterality: N/A;   CHOLECYSTECTOMY  1989   COLONOSCOPY N/A 07/31/2013   Procedure: COLONOSCOPY;  Surgeon: Iva Boop, MD;  Location: [facility] ENDOSCOPY;  Service: Endoscopy;  Laterality: N/A;   ESOPHAGOGASTRODUODENOSCOPY N/A 07/22/2013   Procedure: ESOPHAGOGASTRODUODENOSCOPY (EGD);  Surgeon: Hilarie Fredrickson, MD;  Location: [facility] ENDOSCOPY;  Service: Endoscopy;  Laterality: N/A;   ESOPHAGOGASTRODUODENOSCOPY N/A 07/31/2013   Procedure: ESOPHAGOGASTRODUODENOSCOPY (EGD);  Surgeon: Iva Boop, MD;  Location: [facility] ENDOSCOPY;  Service: Endoscopy;  Laterality: N/A;   INTRAOPERATIVE TRANSESOPHAGEAL ECHOCARDIOGRAM N/A 07/18/2013   Procedure: INTRAOPERATIVE TRANSESOPHAGEAL ECHOCARDIOGRAM;  Surgeon: Delight Ovens, MD;  Location: [facility] OR;  Service: Open Heart Surgery;  Laterality: N/A;   LAPAROSCOPIC GASTRIC BANDING WITH HIATAL HERNIA REPAIR N/A 04/08/2013   Procedure: LAPAROSCOPIC GASTRIC BANDING WITH possible  HIATAL HERNIA REPAIR;  Surgeon: Atilano Ina, MD;  Location: [facility] ORS;  Service: General;  Laterality: N/A;   LITHOTRIPSY     renal calculi     RIGHT AND LEFT HEART CATH N/A 05/12/2021   Procedure: RIGHT AND LEFT HEART CATH;  Surgeon: Dolores Patty, MD;  Location: [facility] INVASIVE CV LAB;  Service: Cardiovascular;  Laterality: N/A;   SUBXYPHOID PERICARDIAL WINDOW N/A 07/18/2013   Procedure: SUBXYPHOID PERICARDIAL WINDOW;  Surgeon: Delight Ovens, MD;  Location: [facility] OR;  Service: Thoracic;  Laterality: N/A;   TONSILLECTOMY     total knee replacment     bilat   trigger finger repair     also lue, cts surgically repaired   WISDOM TOOTH EXTRACTION      Past Surgical History:  Procedure Laterality Date   ARTHROSCOPY KNEE W/ DRILLING     BREATH TEK H PYLORI N/A 12/23/2012   Procedure: BREATH TEK Richardo Priest;  Surgeon: Atilano Ina, MD;  Location: [facility] ENDOSCOPY;  Service: General;  Laterality: N/A;   CHOLECYSTECTOMY  1989   COLONOSCOPY N/A 07/31/2013   Procedure: COLONOSCOPY;  Surgeon: Iva Boop, MD;  Location: [facility] ENDOSCOPY;  Service: Endoscopy;  Laterality: N/A;   ESOPHAGOGASTRODUODENOSCOPY N/A 07/22/2013   Procedure: ESOPHAGOGASTRODUODENOSCOPY (EGD);  Surgeon: Hilarie Fredrickson, MD;  Location: [facility] ENDOSCOPY;  Service: Endoscopy;  Laterality: N/A;   ESOPHAGOGASTRODUODENOSCOPY N/A 07/31/2013   Procedure: ESOPHAGOGASTRODUODENOSCOPY (EGD);  Surgeon: Iva Boop, MD;  Location: [facility] ENDOSCOPY;  Service: Endoscopy;  Laterality: N/A;   INTRAOPERATIVE TRANSESOPHAGEAL ECHOCARDIOGRAM N/A 07/18/2013   Procedure: INTRAOPERATIVE TRANSESOPHAGEAL ECHOCARDIOGRAM;  Surgeon: Delight Ovens, MD;  Location: [facility] OR;  Service: Open Heart Surgery;  Laterality: N/A;   LAPAROSCOPIC GASTRIC BANDING WITH HIATAL HERNIA REPAIR N/A 04/08/2013   Procedure: LAPAROSCOPIC GASTRIC BANDING WITH possible  HIATAL HERNIA REPAIR;  Surgeon: Atilano Ina, MD;  Location: [facility] ORS;  Service: General;  Laterality: N/A;   LITHOTRIPSY     renal calculi     RIGHT AND LEFT HEART CATH N/A 05/12/2021   Procedure: RIGHT AND LEFT HEART CATH;  Surgeon: Dolores Patty, MD;  Location: [facility] INVASIVE CV LAB;  Service: Cardiovascular;  Laterality: N/A;   SUBXYPHOID PERICARDIAL WINDOW N/A 07/18/2013   Procedure: SUBXYPHOID PERICARDIAL WINDOW;  Surgeon: Delight Ovens, MD;  Location: [facility] OR;  Service: Thoracic;  Laterality: N/A;   TONSILLECTOMY     total knee replacment     bilat   trigger finger repair     also lue, cts surgically repaired   WISDOM TOOTH EXTRACTION     Past Medical History:  Diagnosis Date   Anemia 07/29/2013   Asthma    childhood asthma   Benign essential HTN    Benign  prostatic hypertrophy    several prostate biopsies neg for cancer.    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   FH: colonic polyps 2010   Gout    History of kidney stones    Hypertension    Kidney cysts    Morbid obesity (HCC)    Numbness    feet / legs   PAF (paroxysmal atrial fibrillation) with RVR 07/29/2013   Pericarditis 2015   s/p window   Recurrent pulmonary emboli (HCC) 06/13/2015   Sleep apnea    USES C PAP   Stroke syndrome    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   BP 126/71   Pulse 63   Ht 6' (1.829 m)   Wt (!) 337 lb (152.9 kg)   SpO2 95%   BMI 45.71 kg/m   Opioid Risk Score:   Fall Risk Score:  `1  Depression screen PHQ 2/9     04/30/2023    2:45 PM 01/29/2023    1:31 PM 10/10/2022    3:04 PM 06/19/2022    2:24 PM 04/25/2022    1:56 PM 05/24/2021    2:11 PM 12/16/2020    4:15 PM  Depression screen PHQ 2/9  Decreased Interest 0 0 0 0 0 1 0  Down, Depressed, Hopeless 0 0 0 0 0 1 0  PHQ - 2 Score 0 0 0 0 0 2 0  Altered sleeping 0 1 0   0   Tired, decreased energy 0 1 0   0   Change in appetite 0 0 0   0   Feeling bad or failure about yourself  0 0 0   0   Trouble concentrating 1 0 0   0   Moving slowly or fidgety/restless 0 0 0   0   Suicidal thoughts 0 0 0   0   PHQ-9 Score 1 2 0   2   Difficult doing work/chores Not difficult at all Somewhat difficult           Review of Systems  Musculoskeletal:  Positive for back pain and gait problem.  All other systems reviewed and are negative.     Objective:   Physical Exam Motor strength is 4/5 bilateral hip flexor knee extensor 5/5 ankle dorsiflexor.  He has difficulty arising from a  chair to a standing position using his hands as a support.  His balance is poor Ambulates with a cane no evidence of toe drag or knee instability Lower extremity sensation absent below the ankle area present at the knee Negative straight leg raise bilaterally Lumbar spine has some tenderness palpation lumbar paraspinals less so at the PSIS area He has pain with extension of his spine and tends to stand in a forward flexed posture.  He is able to bend forward about 50% of normal range.       Assessment & Plan:  1.  Lumbar spinal stenosis no neurogenic claudication no sciatic symptoms at this time.  His lumbar pain is likely a combination of degenerative disc as well as lumbar facet arthropathy.  He has previously tried lumbar medial branch blocks but had less than 50% relief.  He does not have any symptoms suggestive of sacroiliac pain as his pain is mainly at and above his waist 2.  Bilateral foot numbness question polyneuropathy, no history of diabetes although he is prediabetic.  The last MRI of the lumbar spine did not show any convincing signs of L5 or S1 nerve root compression Will continue with tramadol 50 mg 3 times daily As needed methocarbamol.  His saturation of pain seems to  be doing better.  He will follow-up with my nurse practitioner in 6 months We discussed sit to stand exercises 3 sets of 10 where he is holding onto the sink for balance

## 2023-05-03 NOTE — Patient Instructions (Signed)
PLEASE DO SIT TO STAND EXERCISE AT THE SINK , HAVE A CHAIR BEHIND YOU , TOUCH YOUR BOTTOM ON THE CHAIR AND DO 3SETS OF 10 REPETITION OR UNTIL YOUR BREATHING INTERFERES  PLEASE USE THE MUSCLE RELAXER AS NEEDED  CONT TRAMADOL

## 2023-05-08 ENCOUNTER — Telehealth: Payer: Self-pay

## 2023-05-08 NOTE — Progress Notes (Signed)
 Care Guide Pharmacy Note  05/08/2023 Name: [name] MRN: [id number] DOB: 08-Feb-1946  Referred By: [name], MD Reason for referral: Care Coordination (Outreach to schedule with pharm d )   [name] is a 78 y.o. year old male who is a primary care patient of [name], MD.  [name] was referred to the pharmacist for assistance related to: DMII  Successful contact was made with the patient to discuss pharmacy services including being ready for the pharmacist to call at least 5 minutes before the scheduled appointment time and to have medication bottles and any blood pressure readings ready for review. The patient agreed to meet with the pharmacist via telephone visit on (date/time).05/25/2023  [name] , RMA     [place]  [facility], [facility] Guide  Direct Dial: [phone]  Website: [email or web address]

## 2023-05-22 ENCOUNTER — Other Ambulatory Visit: Payer: Self-pay | Admitting: Internal Medicine

## 2023-05-22 DIAGNOSIS — E118 Type 2 diabetes mellitus with unspecified complications: Secondary | ICD-10-CM

## 2023-05-22 DIAGNOSIS — G4733 Obstructive sleep apnea (adult) (pediatric): Secondary | ICD-10-CM

## 2023-05-24 ENCOUNTER — Ambulatory Visit: Payer: Medicare Other | Admitting: Internal Medicine

## 2023-05-25 ENCOUNTER — Ambulatory Visit: Payer: Self-pay | Admitting: Internal Medicine

## 2023-05-25 ENCOUNTER — Other Ambulatory Visit: Payer: Medicare Other | Admitting: Pharmacist

## 2023-05-25 DIAGNOSIS — G4733 Obstructive sleep apnea (adult) (pediatric): Secondary | ICD-10-CM

## 2023-05-25 DIAGNOSIS — E118 Type 2 diabetes mellitus with unspecified complications: Secondary | ICD-10-CM

## 2023-05-25 MED ORDER — TIRZEPATIDE 7.5 MG/0.5ML ~~LOC~~ SOAJ: 7.5000 mg | SUBCUTANEOUS | 0 refills | Status: DC

## 2023-05-25 NOTE — Progress Notes (Signed)
   05/25/2023 Name: [name] MRN: [id number] DOB: 1946/01/05  Chief Complaint  Patient presents with   Medication Access    [name] is a 78 y.o. year old male who presented for a telephone visit.   They were referred to the pharmacist by their PCP for assistance in managing medication access.     Subjective:  Care Team: Primary Care Provider: [name], MD ; Next Scheduled Visit: 06/21/23   Medication Access/Adherence  Current Pharmacy:  [id number] [place], [place] - [address] AT [facility] OF [facility] & [address] [name] [address] Phone: [phone] Fax: [phone]   Patient reports affordability concerns with their medications: Yes  Patient reports access/transportation concerns to their pharmacy: No  Patient reports adherence concerns with their medications:  No     Eliquis, Jardiance, and Mounjaro high cost. 30 DS of Mounjaro is $250 alone.   Weight loss: Currently on Mounjaro 5 mg since about November. Has lost about 30 lbs since starting [facility] He heard the dose can be increased and inquires if this is possible   Objective:  Lab Results  Component Value Date   HGBA1C 6.1 01/29/2023    Lab Results  Component Value Date   CREATININE 1.12 01/29/2023   BUN 22 01/29/2023   NA 141 01/29/2023   K 3.8 01/29/2023   CL 100 01/29/2023   CO2 33 (H) 01/29/2023    Lab Results  Component Value Date   CHOL 114 01/29/2023   HDL 28.70 (L) 01/29/2023   LDLCALC 61 01/29/2023   LDLDIRECT 91.0 05/16/2018   TRIG 126.0 01/29/2023   CHOLHDL 4 01/29/2023    Medications Reviewed Today   Medications were not reviewed in this encounter       Assessment/Plan:   Medication access: Discussed possibility of PAP if switched Mounjaro to [place] and [place] to [place]. He would like to avoid Marcelline Deist - his wife had a bad reaction to it. He meets criteria for Ozempic and Eliquis. Looked  up med costs on medicare.gov - his insurance copay for Tier 3 drugs if 25% insurance which is why Greggory Keen is $250 and Eliquis and Jardiance are $150 each for 30 DS. He should meet the $2k cap on drug cost in March and after that ALL drug cost will go down to $0 for the rest of the year.  He would prefer to not change his meds and wait to meet the $2k cap. Gave pt information regarding medicare.gov and also SHIP if he needs help Child psychotherapist insurance for drug coverage  Weight loss: Increase Mounjaro to 7.5 mg since he has tolerated 5 mg well  Follow Up Plan: PRN  Arbutus Leas, PharmD, BCPS, CPP Clinical Pharmacist Practitioner [facility] [facility] [phone]

## 2023-05-25 NOTE — Patient Instructions (Signed)
 It was a pleasure speaking with you today!  Check out medicare.gov the "Find health & drug plans" section to look into drug cost with different plans.  I have sent in Mounjaro 7.5 mg to get for your next [facility] refill.  Feel free to call with any questions or concerns!  Arbutus Leas, PharmD, BCPS, CPP Clinical Pharmacist Practitioner [place] [facility] [phone]

## 2023-05-25 NOTE — Telephone Encounter (Signed)
 Copied from CRM [id number]. Topic: Clinical - Medication Question >> May 25, 2023 11:08 AM Adele Barthel wrote: Reason for CRM:   Patient is calling in regards to his [facility] prescription, and has several questions.  He was advised by pharmacy that he only receives a 30-day supply due to the medication dosage typically getting increased over time. He has been taking the same dosage since 02/2023 and is wondering if his dosage will be increased. If not, he would like to see if he can receive more than 34-month supply at a time.   He was also advised by pharmacy that the Greggory Keen is out of stock and will hopefully be in today, possibly Monday. He is concerned due to taking his last shot yesterday, and possible side effects from stopping medication for a short period of time.   CB# [phone] [id number]

## 2023-05-28 ENCOUNTER — Other Ambulatory Visit: Payer: Self-pay | Admitting: Internal Medicine

## 2023-05-28 DIAGNOSIS — E118 Type 2 diabetes mellitus with unspecified complications: Secondary | ICD-10-CM

## 2023-05-28 DIAGNOSIS — G4733 Obstructive sleep apnea (adult) (pediatric): Secondary | ICD-10-CM

## 2023-05-28 NOTE — Telephone Encounter (Signed)
 Does the dosage of his medication need to be changed ?

## 2023-06-03 NOTE — Progress Notes (Unsigned)
 HPI male former smoker followed for OSA, Restless Legs,, complicated by A. fib, recurrent PE, bariatric surgery, major depression, dysphonia, midbrain CVA NPSG 10/25/05- severe OSA, AHI 79/ hr, CPAP to 16. Frequent limb jerks with sleep disturbance PFT 01/03/17-minimal obstruction without response to bronchodilator.  FVC 3.94/83%, FEV1 3.00/86%, ratio 2.76, TLC 96%, DLCO 78% Walk test on room air 04/08/2018-  Max HR 91, Min Sat 92% Walk Test on room air 04/26/2021-he was barely able to walk into the bathroom and out on room air before he desaturated to 80%. Arrival room air saturation today at rest 92%. -----------------------------------------------------------------------------------  08/01/21- 78 year old male former smoker followed for OSA, Restless Legs, complicated by A. Fib, diastolic CHF recurrent PE, bariatric surgery/ Morbid Obesity,  major depression, dysphonia, midbrain CVA, DM2, Gastric Ulcer, Pulmonary Hypertension(worse with exertion),  -Melatonin, Lorazepam, Requip, Zoloft,  BiPap 25/ 22/[employer] O2 2l/ [employer]     Download-compliance - 90%, AHI 7.3/ hr Body weight today-376 lbs            Arrival O2 sat 95% Covid vax-4 Phizer                         Flu vax-had Noted by cardiology to have rapid rise in PA pressure with minimal exertion.PFT in 2018- minimal obstruction. Little cough or wheeze. Cardiology will guide management of PAH. He continues O2 2L.  CTa chest 03/22/21 did not show evidence of chronic PE. Some insomnia, managed with melatonin and lorazepam.  06/04/23-  78 year old male former smoker followed for OSA, Restless Legs, complicated by A. Fib, diastolic CHF recurrent PE, bariatric surgery/ Morbid Obesity,  major depression, dysphonia, midbrain CVA, DM2, Gastric Ulcer, Pulmonary Hypertension(worse with exertion),  -Melatonin, Lorazepam, Requip, Zoloft,  O2 2l/ [employer]     BiPap 25/ 22/Adapt Download-compliance - 97%, AHI 18.2/hr (all centrals) Body weight today-329  lbs    ROS-see HPI   + = positive Constitutional:    weight loss, night sweats, fevers, chills, fatigue, lassitude. HEENT:    headaches, difficulty swallowing, tooth/dental problems, sore throat,       sneezing, itching, ear ache,+ nasal congestion, post nasal drip, snoring CV:    chest pain, orthopnea, PND, swelling in lower extremities, anasarca,                                                 dizziness, +palpitations Resp:   +shortness of breath with exertion or at rest.                productive cough,   non-productive cough, coughing up of blood.              change in color of mucus.  wheezing.   Skin:    rash or lesions. GI:  No-   heartburn, indigestion, abdominal pain, nausea, vomiting,  GU:  MS:   joint pain, stiffness, Neuro-     nothing unusual Psych:  change in mood or affect.  depression or anxiety.   memory loss.  OBJ- Physical Exam General- Alert, Oriented, Affect-appropriate, Distress- none acute, + morbidly obese, +power scooter Skin- rash-none, lesions- none, excoriation- none Lymphadenopathy- none Head- atraumatic            Eyes- Gross vision intact, PERRLA, conjunctivae and secretions clear            Ears- +Hearing  aid            Nose-  +moderate turbinate edema and mucus congestion, no-Septal dev, mucus, polyps,  erosion, perforation             Throat- Mallampati III , mucosa clear , drainage- none, tonsils- atrophic, + hoarse Neck- flexible , trachea midline, no stridor , thyroid nl, carotid no bruit Chest - symmetrical excursion , unlabored           Heart/CV- RRR , no murmur , no gallop  , no rub, nl s1 s2                           - JVD- none , edema- none, stasis changes- none, varices- none           Lung- clear to P&A, Unlabored during quiet conversation, wheeze- none, cough- none , dullness-none, rub- none           Chest wall-  Abd-  Br/ Gen/ Rectal- Not done, not indicated Extrem- cyanosis- none, clubbing, none, atrophy- none, strength-  nl Neuro- grossly intact to observation

## 2023-06-05 ENCOUNTER — Encounter: Payer: Self-pay | Admitting: Internal Medicine

## 2023-06-05 ENCOUNTER — Ambulatory Visit: Payer: Medicare Other | Admitting: Internal Medicine

## 2023-06-05 VITALS — BP 118/60 | HR 64 | Temp 97.7°F | Ht 72.0 in | Wt 329.0 lb

## 2023-06-05 DIAGNOSIS — I2729 Other secondary pulmonary hypertension: Secondary | ICD-10-CM | POA: Diagnosis not present

## 2023-06-05 DIAGNOSIS — J9611 Chronic respiratory failure with hypoxia: Secondary | ICD-10-CM

## 2023-06-05 DIAGNOSIS — R600 Localized edema: Secondary | ICD-10-CM | POA: Diagnosis not present

## 2023-06-05 DIAGNOSIS — G4733 Obstructive sleep apnea (adult) (pediatric): Secondary | ICD-10-CM | POA: Diagnosis not present

## 2023-06-05 NOTE — Patient Instructions (Signed)
 Order- referral to Dr [facility] consultation/ management pulmonary hypertension  Order- referral to Podiatry   evaluate need for custom shoes for chronic edema, weak ankles  Keep trying to lose weight  We will continue BIPAP 25/22 with oxygen 3L

## 2023-06-06 ENCOUNTER — Encounter: Payer: Self-pay | Admitting: Internal Medicine

## 2023-06-11 ENCOUNTER — Encounter (HOSPITAL_COMMUNITY): Payer: Self-pay | Admitting: Psychiatry

## 2023-06-11 ENCOUNTER — Telehealth (HOSPITAL_BASED_OUTPATIENT_CLINIC_OR_DEPARTMENT_OTHER): Payer: Medicare Other | Admitting: Psychiatry

## 2023-06-11 VITALS — Wt 329.0 lb

## 2023-06-11 DIAGNOSIS — F419 Anxiety disorder, unspecified: Secondary | ICD-10-CM | POA: Diagnosis not present

## 2023-06-11 DIAGNOSIS — F330 Major depressive disorder, recurrent, mild: Secondary | ICD-10-CM

## 2023-06-11 MED ORDER — SERTRALINE HCL 100 MG PO TABS: 200.0000 mg | ORAL_TABLET | Freq: Every day | ORAL | 0 refills | Status: DC

## 2023-06-11 MED ORDER — LORAZEPAM 0.5 MG PO TABS: 0.5000 mg | ORAL_TABLET | Freq: Every day | ORAL | 2 refills | Status: DC

## 2023-06-11 NOTE — Progress Notes (Signed)
 [facility] MD Virtual Progress Note   Patient Location: Home Provider Location: Home Office  I connect with patient by video and verified that I am speaking with correct person by using two identifiers. I discussed the limitations of evaluation and management by telemedicine and the availability of in person appointments. I also discussed with the patient that there may be a patient responsible charge related to this service. The patient expressed understanding and agreed to proceed.  [name] [id number] 78 y.o.  06/11/2023 2:09 PM  History of Present Illness:  Patient is evaluated by video session.  He reported things are much better now as in December he had called [place] after he feels not doing very well.  He was under a lot of stress because of the holidays and family visits.  Dr. Mercy Riding who was covering psychiatrist increased the Zoloft and he is taking 200 mg since then.  He noticed it had helped him a lot.  So far he is not having any side effects including tremor or shakes.  He is also taking Ativan every day which is keeping his anxiety under control.  He is on [facility] and he had lost significant weight since November.  He is about his mental level is better but sometimes he gets very fatigued tired due to his chronic health issues and needed oxygen.  Due to his oxygen cylinder he is limited and unable to go outside as much he like to but try to keep himself busy watching TV and listening stones.  He does drive when needed and is still has an active license but prefer not to drive because he requires a lot of preparation for driving.  He is very pleased that his wife is back from rehab and doing very well.  Denies any crying spells or any feeling of hopelessness or worthlessness.  He enjoys the company of his granddaughter who is now 68 years old.  Denies any suicidal thoughts.  He denies any agitation, irritability or anger.  He like to keep his current medication.     Past Psychiatric History: H/O inpatient in 2006 for depression and having suicidal thoughts. Took Risperdal but d/c after stroke in March 2018.    Outpatient Encounter Medications as of 06/11/2023  Medication Sig   allopurinol (ZYLOPRIM) 100 MG tablet TAKE 1 TABLET(100 MG) BY MOUTH DAILY   atorvastatin (LIPITOR) 80 MG tablet TAKE 1 TABLET(80 MG) BY MOUTH DAILY   carboxymethylcellulose (REFRESH PLUS) 0.5 % SOLN Place 1 drop into both eyes 3 (three) times daily as needed (dry eyes).   [employer] MELATONIN GUMMIES PO Take 5 mg by mouth at bedtime.   Docusate Sodium (DSS) 100 MG CAPS Take 100 mg by mouth every other day.   ELIQUIS 5 MG TABS tablet TAKE 1 TABLET(5 MG) BY MOUTH TWICE DAILY   empagliflozin (JARDIANCE) 10 MG TABS tablet TAKE 1 TABLET(10 MG) BY MOUTH DAILY BEFORE BREAKFAST   finasteride (PROSCAR) 5 MG tablet Take 1 tablet (5 mg total) by mouth daily.   flecainide (TAMBOCOR) 50 MG tablet TAKE 1 TABLET(50 MG) BY MOUTH EVERY 12 HOURS   LORazepam (ATIVAN) 0.5 MG tablet Take 1 tablet (0.5 mg total) by mouth at bedtime.   losartan (COZAAR) 25 MG tablet Take 1 tablet (25 mg total) by mouth daily.   methocarbamol (ROBAXIN) 500 MG tablet Take 1 tablet (500 mg total) by mouth 3 (three) times daily as needed for muscle spasms.   metoprolol succinate (TOPROL-XL) 25 MG 24 hr  tablet TAKE 1 TABLET(25 MG) BY MOUTH DAILY   montelukast (SINGULAIR) 10 MG tablet TAKE 1 TABLET(10 MG) BY MOUTH AT BEDTIME   Multiple Vitamin (MULTIVITAMIN WITH MINERALS) TABS tablet Take 1 tablet by mouth daily.   pantoprazole (PROTONIX) 40 MG tablet TAKE 1 TABLET(40 MG) BY MOUTH DAILY   potassium chloride SA (KLOR-CON M) 20 MEQ tablet TAKE 1 TABLET(20 MEQ) BY MOUTH TWICE DAILY   rOPINIRole (REQUIP) 0.5 MG tablet TAKE 1 TABLET(0.5 MG) BY MOUTH THREE TIMES DAILY   sertraline (ZOLOFT) 100 MG tablet Take 2 tablets (200 mg total) by mouth at bedtime.   tirzepatide (MOUNJARO) 7.5 MG/0.5ML Pen Inject 7.5 mg into the skin once a week.    torsemide (DEMADEX) 20 MG tablet TAKE 3 TABLETS BY MOUTH EVERY DAY   traMADol (ULTRAM) 50 MG tablet TAKE 1 TABLET BY MOUTH EVERY 8 HOURS AS NEEDED   zinc gluconate 50 MG tablet Take 1 tablet (50 mg total) by mouth daily.   No facility-administered encounter medications on file as of 06/11/2023.    No results found for this or any previous visit (from the past 2160 hours).   Psychiatric Specialty Exam: Physical Exam  Review of Systems  Constitutional:  Positive for fatigue.    Weight (!) 329 lb (149.2 kg).There is no height or weight on file to calculate BMI.  General Appearance: Casual and on nasal oxygen  Eye Contact:  Fair  Speech:  Slow  Volume:  Decreased  Mood:  Euthymic  Affect:  Congruent  Thought Process:  Descriptions of Associations: Intact  Orientation:  Full (Time, Place, and Person)  Thought Content:  Rumination  Suicidal Thoughts:  No  Homicidal Thoughts:  No  Memory:  Immediate;   Good Recent;   Fair Remote;   Fair  Judgement:  Intact  Insight:  Present  Psychomotor Activity:  Decreased  Concentration:  Concentration: Fair and Attention Span: Fair  Recall:  Fair  Fund of Knowledge:  Good  Language:  Good  Akathisia:  No  Handed:  Right  AIMS (if indicated):     Assets:  Communication Skills Desire for Improvement Housing Transportation  ADL's:  Intact  Cognition:  WNL  Sleep:  on BiPAP getting 6 hrs     Assessment/Plan: Major depressive disorder, recurrent episode, mild (HCC) - Plan: LORazepam (ATIVAN) 0.5 MG tablet, sertraline (ZOLOFT) 100 MG tablet  Anxiety - Plan: LORazepam (ATIVAN) 0.5 MG tablet  I reviewed collateral information.  Patient is now taking Zoloft 200 mg which was increased in December by covering psychiatrist after feeling sad and anxious.  So far he is tolerating medication.  He like to keep appointment in 3 months and once more stable then he will move to 83-month appointment.  He is on [facility] which had helped him weight  loss.  He is happy since wife is back from rehab.  Continue Zoloft 200 mg daily and Ativan 0.5 mg as needed for anxiety.  Recommend to call us back if he has any question or any concern.  Follow-up in 3 months however we will consider moving to 6 months in the future.   Follow Up Instructions:     I discussed the assessment and treatment plan with the patient. The patient was provided an opportunity to ask questions and all were answered. The patient agreed with the plan and demonstrated an understanding of the instructions.   The patient was advised to call back or seek an in-person evaluation if the symptoms worsen or if  the condition fails to improve as anticipated.    Collaboration of Care: Other provider involved in patient's care AEB notes are available in epic to review  Patient/Guardian was advised Release of Information must be obtained prior to any record release in order to collaborate their care with an outside provider. Patient/Guardian was advised if they have not already done so to contact the registration department to sign all necessary forms in order for [place] to release information regarding their care.   Consent: Patient/Guardian gives verbal consent for treatment and assignment of benefits for services provided during this visit. Patient/Guardian expressed understanding and agreed to proceed.     I provided 15 minutes of non face to face time during this encounter.  Note: This document was prepared by [employer] voice dictation technology and any errors that results from this process are unintentional.    Cleotis Nipper, MD 06/11/2023

## 2023-06-13 ENCOUNTER — Ambulatory Visit (INDEPENDENT_AMBULATORY_CARE_PROVIDER_SITE_OTHER): Admitting: Podiatry

## 2023-06-13 DIAGNOSIS — M2042 Other hammer toe(s) (acquired), left foot: Secondary | ICD-10-CM | POA: Diagnosis not present

## 2023-06-13 DIAGNOSIS — E118 Type 2 diabetes mellitus with unspecified complications: Secondary | ICD-10-CM | POA: Diagnosis not present

## 2023-06-13 DIAGNOSIS — M2041 Other hammer toe(s) (acquired), right foot: Secondary | ICD-10-CM

## 2023-06-13 DIAGNOSIS — M25371 Other instability, right ankle: Secondary | ICD-10-CM | POA: Diagnosis not present

## 2023-06-13 DIAGNOSIS — M25372 Other instability, left ankle: Secondary | ICD-10-CM

## 2023-06-13 NOTE — Progress Notes (Signed)
 Subjective:  Patient ID: [name], male    DOB: 07-26-1945,  MRN: [id number]  Chief Complaint  Patient presents with   Diabetes    78 y.o. male presents with the above complaint.  Patient presents with complaint of bilateral chronic ankle instability that has been present for quite some time is progressive gotten worse.  Patient states he is fallen more down multiple times due to the instability.  He would like to discuss bracing for this.  He also states that he is a diabetic and would like to discuss diabetic shoes as well.  He would like to discuss both of these options he denies seeing anyone else prior to seeing me denies any other acute complaints hurts with ambulation hurts with pressure   Review of Systems: Negative except as noted in the HPI. Denies N/V/F/Ch.  Past Medical History:  Diagnosis Date   Anemia 07/29/2013   Asthma    childhood asthma   Benign essential HTN    Benign prostatic hypertrophy    several prostate biopsies neg for cancer.    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   FH: colonic polyps 2010   Gout    History of kidney stones    Hypertension    Kidney cysts    Morbid obesity (HCC)    Numbness    feet / legs   PAF (paroxysmal atrial fibrillation) with RVR 07/29/2013   Pericarditis 2015   s/p window   Recurrent pulmonary emboli (HCC) 06/13/2015   Sleep apnea    USES C PAP   Stroke syndrome    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    Current Outpatient Medications:    allopurinol (ZYLOPRIM) 100 MG tablet, TAKE 1 TABLET(100 MG) BY MOUTH DAILY, Disp: 90 tablet, Rfl: 0   atorvastatin (LIPITOR) 80 MG tablet, TAKE 1 TABLET(80  MG) BY MOUTH DAILY, Disp: 90 tablet, Rfl: 1   carboxymethylcellulose (REFRESH PLUS) 0.5 % SOLN, Place 1 drop into both eyes 3 (three) times daily as needed (dry eyes)., Disp: , Rfl:    CVS MELATONIN GUMMIES PO, Take 5 mg by mouth at bedtime., Disp: , Rfl:    Docusate Sodium (DSS) 100 MG CAPS, Take 100 mg by mouth every other day., Disp: , Rfl:    ELIQUIS 5 MG TABS tablet, TAKE 1 TABLET(5 MG) BY MOUTH TWICE DAILY, Disp: 180 tablet, Rfl: 0   empagliflozin (JARDIANCE) 10 MG TABS tablet, TAKE 1 TABLET(10 MG) BY MOUTH DAILY BEFORE BREAKFAST, Disp: 90 tablet, Rfl: 2   finasteride (PROSCAR) 5 MG tablet, Take 1 tablet (5 mg total) by mouth daily., Disp: 30 tablet, Rfl: 1   flecainide (TAMBOCOR) 50 MG tablet, TAKE 1 TABLET(50 MG) BY MOUTH EVERY 12 HOURS, Disp: 180 tablet, Rfl: 3   LORazepam (ATIVAN) 0.5 MG tablet, Take 1 tablet (0.5 mg total) by mouth at bedtime., Disp: 30 tablet, Rfl: 2   losartan (COZAAR) 25 MG tablet, Take 1 tablet (25 mg total) by mouth daily., Disp: 90 tablet, Rfl: 3   methocarbamol (ROBAXIN) 500 MG tablet, Take 1 tablet (500 mg total) by mouth 3 (three) times daily as needed for muscle spasms., Disp: 90 tablet, Rfl:  0   metoprolol succinate (TOPROL-XL) 25 MG 24 hr tablet, TAKE 1 TABLET(25 MG) BY MOUTH DAILY, Disp: 90 tablet, Rfl: 0   montelukast (SINGULAIR) 10 MG tablet, TAKE 1 TABLET(10 MG) BY MOUTH AT BEDTIME, Disp: 90 tablet, Rfl: 1   Multiple Vitamin (MULTIVITAMIN WITH MINERALS) TABS tablet, Take 1 tablet by mouth daily., Disp: , Rfl:    pantoprazole (PROTONIX) 40 MG tablet, TAKE 1 TABLET(40 MG) BY MOUTH DAILY, Disp: 90 tablet, Rfl: 1   potassium chloride SA (KLOR-CON M) 20 MEQ tablet, TAKE 1 TABLET(20 MEQ) BY MOUTH TWICE DAILY, Disp: 180 tablet, Rfl: 0   rOPINIRole (REQUIP) 0.5 MG tablet, TAKE 1 TABLET(0.5 MG) BY MOUTH THREE TIMES DAILY, Disp: 270 tablet, Rfl: 4   sertraline (ZOLOFT) 100 MG tablet, Take 2 tablets (200 mg total) by mouth at bedtime., Disp: 180 tablet, Rfl: 0    tirzepatide (MOUNJARO) 7.5 MG/0.5ML Pen, Inject 7.5 mg into the skin once a week., Disp: 6 mL, Rfl: 0   torsemide (DEMADEX) 20 MG tablet, TAKE 3 TABLETS BY MOUTH EVERY DAY, Disp: 270 tablet, Rfl: 2   traMADol (ULTRAM) 50 MG tablet, TAKE 1 TABLET BY MOUTH EVERY 8 HOURS AS NEEDED, Disp: 90 tablet, Rfl: 5   zinc gluconate 50 MG tablet, Take 1 tablet (50 mg total) by mouth daily., Disp: 90 tablet, Rfl: 1  Social History   Tobacco Use  Smoking Status Former   Current packs/day: 0.00   Average packs/day: 0.2 packs/day for 13.4 years (2.7 ttl pk-yrs)   Types: Cigarettes   Start date: 04/03/1961   Quit date: 08/30/1974   Years since quitting: 48.8  Smokeless Tobacco Never    Allergies  Allergen Reactions   Morphine Nausea And Vomiting and Other (See Comments)    Severe hallucinations Other reaction(s): Hallucinations   Penicillins Other (See Comments)    Passed out after injection Did it involve swelling of the face/tongue/throat, SOB, or low BP? Unknown Did it involve sudden or severe rash/hives, skin peeling, or any reaction on the inside of your mouth or nose? No Did you need to seek medical attention at a hospital or doctor's office? Yes When did it last happen?    teenager   If all above answers are "NO", may proceed with cephalosporin use. Other reaction(s): Dizziness, Unknown   Codeine Nausea Only   Propoxyphene N-Acetaminophen Nausea Only   Objective:  There were no vitals filed for this visit. There is no height or weight on file to calculate BMI. Constitutional Well developed. Well nourished.  Vascular Dorsalis pedis pulses palpable bilaterally. Posterior tibial pulses palpable bilaterally. Capillary refill normal to all digits.  No cyanosis or clubbing noted. Pedal hair growth normal.  Neurologic Normal speech. Oriented to person, place, and time. Epicritic sensation to light touch grossly present bilaterally.  Dermatologic Nails well groomed and normal in  appearance. No open wounds. No skin lesions.  Orthopedic: Gait examination shows bilateral chronic ankle instability with pain on palpation to the ATFL ligament.  Negative anterior drawer talar tilt test noted.  Positive Romberg's test.  Hammertoe contracture noted 2 through 4 bilaterally semiflexible in nature no open wounds or lesion   Radiographs: None Assessment:   1. Type II diabetes mellitus with manifestations (HCC)   2. Hammertoe, bilateral   3. Ankle instability, right   4. Ankle instability, left    Plan:  Patient was evaluated and treated and all questions answered.  Bilateral chronic ankle instability with history of fall -All questions and concerns were  discussed with the patient extensive detail patient will benefit from [place] bracing to give stability.  He already has good enough relief with ankle bracing he would like to discuss more rigid bracing.  He will be scheduled with Trish to see for [place] bracing  Hammertoe bilateral -I explained to patient the etiology of hammertoe contracture and worse treatment options were discussed.  Given the presence of hammertoe contracture patient will benefit from diabetic shoes he will be scheduled to see Trish for diabetic shoes as well  No follow-ups on file.

## 2023-06-16 ENCOUNTER — Other Ambulatory Visit: Payer: Self-pay | Admitting: Internal Medicine

## 2023-06-16 DIAGNOSIS — I480 Paroxysmal atrial fibrillation: Secondary | ICD-10-CM

## 2023-06-21 ENCOUNTER — Encounter: Payer: Self-pay | Admitting: Internal Medicine

## 2023-06-21 ENCOUNTER — Ambulatory Visit: Payer: Medicare Other | Admitting: Internal Medicine

## 2023-06-21 VITALS — BP 92/48 | HR 62 | Temp 97.5°F | Resp 16 | Ht 72.0 in

## 2023-06-21 DIAGNOSIS — M1A09X0 Idiopathic chronic gout, multiple sites, without tophus (tophi): Secondary | ICD-10-CM

## 2023-06-21 DIAGNOSIS — I700 Atherosclerosis of aorta: Secondary | ICD-10-CM | POA: Diagnosis not present

## 2023-06-21 DIAGNOSIS — E118 Type 2 diabetes mellitus with unspecified complications: Secondary | ICD-10-CM

## 2023-06-21 DIAGNOSIS — R001 Bradycardia, unspecified: Secondary | ICD-10-CM | POA: Diagnosis not present

## 2023-06-21 DIAGNOSIS — I959 Hypotension, unspecified: Secondary | ICD-10-CM

## 2023-06-21 DIAGNOSIS — E785 Hyperlipidemia, unspecified: Secondary | ICD-10-CM

## 2023-06-21 DIAGNOSIS — G63 Polyneuropathy in diseases classified elsewhere: Secondary | ICD-10-CM | POA: Diagnosis not present

## 2023-06-21 DIAGNOSIS — Z7985 Long-term (current) use of injectable non-insulin antidiabetic drugs: Secondary | ICD-10-CM | POA: Diagnosis not present

## 2023-06-21 DIAGNOSIS — D539 Nutritional anemia, unspecified: Secondary | ICD-10-CM

## 2023-06-21 DIAGNOSIS — E538 Deficiency of other specified B group vitamins: Secondary | ICD-10-CM

## 2023-06-21 DIAGNOSIS — D538 Other specified nutritional anemias: Secondary | ICD-10-CM

## 2023-06-21 DIAGNOSIS — Z7984 Long term (current) use of oral hypoglycemic drugs: Secondary | ICD-10-CM

## 2023-06-21 LAB — BASIC METABOLIC PANEL
BUN: 35 mg/dL — ABNORMAL HIGH (ref 6–23)
CO2: 35 meq/L — ABNORMAL HIGH (ref 19–32)
Calcium: 8.6 mg/dL (ref 8.4–10.5)
Chloride: 101 meq/L (ref 96–112)
Creatinine, Ser: 1.01 mg/dL (ref 0.40–1.50)
GFR: 71.74 mL/min (ref >60.00)
Glucose, Bld: 127 mg/dL — ABNORMAL HIGH (ref 70–99)
Potassium: 4.4 meq/L (ref 3.5–5.1)
Sodium: 143 meq/L (ref 135–145)

## 2023-06-21 LAB — CBC WITH DIFFERENTIAL/PLATELET
Basophils Absolute: 0 10*3/uL (ref 0.0–0.1)
Basophils Relative: 0.4 % (ref 0.0–3.0)
Eosinophils Absolute: 0.2 10*3/uL (ref 0.0–0.7)
Eosinophils Relative: 2 % (ref 0.0–5.0)
HCT: 37.6 % — ABNORMAL LOW (ref 39.0–52.0)
Hemoglobin: 12.2 g/dL — ABNORMAL LOW (ref 13.0–17.0)
Lymphocytes Relative: 13.8 % (ref 12.0–46.0)
Lymphs Abs: 1.2 10*3/uL (ref 0.7–4.0)
MCHC: 32.4 g/dL (ref 30.0–36.0)
MCV: 95.8 fl (ref 78.0–100.0)
Monocytes Absolute: 0.8 10*3/uL (ref 0.1–1.0)
Monocytes Relative: 8.9 % (ref 3.0–12.0)
Neutro Abs: 6.7 10*3/uL (ref 1.4–7.7)
Neutrophils Relative %: 74.9 % (ref 43.0–77.0)
Platelets: 210 10*3/uL (ref 150.0–400.0)
RBC: 3.93 Mil/uL — ABNORMAL LOW (ref 4.22–5.81)
RDW: 14 % (ref 11.5–15.5)
WBC: 9 10*3/uL (ref 4.0–10.5)

## 2023-06-21 LAB — IBC + FERRITIN
Ferritin: 134.2 ng/mL (ref 22.0–322.0)
Iron: 51 ug/dL (ref 42–165)
Saturation Ratios: 19.6 % — ABNORMAL LOW (ref 20.0–50.0)
TIBC: 260.4 ug/dL (ref 250.0–450.0)
Transferrin: 186 mg/dL — ABNORMAL LOW (ref 212.0–360.0)

## 2023-06-21 LAB — HEPATIC FUNCTION PANEL
ALT: 14 U/L (ref 0–53)
AST: 21 U/L (ref 0–37)
Albumin: 3.4 g/dL — ABNORMAL LOW (ref 3.5–5.2)
Alkaline Phosphatase: 139 U/L — ABNORMAL HIGH (ref 39–117)
Bilirubin, Direct: 0.2 mg/dL (ref 0.0–0.3)
Total Bilirubin: 0.6 mg/dL (ref 0.2–1.2)
Total Protein: 6.5 g/dL (ref 6.0–8.3)

## 2023-06-21 LAB — TSH: TSH: 1.99 u[IU]/mL (ref 0.35–5.50)

## 2023-06-21 LAB — MICROALBUMIN / CREATININE URINE RATIO
Creatinine,U: 55.4 mg/dL
Microalb Creat Ratio: UNDETERMINED mg/g (ref 0.0–30.0)
Microalb, Ur: <0.7 mg/dL

## 2023-06-21 LAB — CORTISOL: Cortisol, Plasma: 7.3 ug/dL

## 2023-06-21 LAB — TROPONIN I (HIGH SENSITIVITY): High Sens Troponin I: 10 ng/L (ref 2–17)

## 2023-06-21 LAB — URIC ACID: Uric Acid, Serum: 7.2 mg/dL (ref 4.0–7.8)

## 2023-06-21 LAB — HEMOGLOBIN A1C: Hgb A1c MFr Bld: 5.7 % (ref 4.6–6.5)

## 2023-06-21 LAB — FOLATE: Folate: >25.2 ng/mL (ref >5.9)

## 2023-06-21 MED ORDER — CYANOCOBALAMIN 1000 MCG/ML IJ SOLN
1000.0000 ug | Freq: Once | INTRAMUSCULAR | Status: AC
  Administered 2023-06-21: 1000 ug via INTRAMUSCULAR

## 2023-06-21 NOTE — Progress Notes (Unsigned)
 Subjective:  Patient ID: [name], male    DOB: 12-24-1945  Age: 78 y.o. MRN: [id number]  CC: No chief complaint on file.   HPI [name] presents for ***  Outpatient Medications Prior to Visit  Medication Sig Dispense Refill   allopurinol (ZYLOPRIM) 100 MG tablet TAKE 1 TABLET(100 MG) BY MOUTH DAILY 90 tablet 0   atorvastatin (LIPITOR) 80 MG tablet TAKE 1 TABLET(80 MG) BY MOUTH DAILY 90 tablet 1   carboxymethylcellulose (REFRESH PLUS) 0.5 % SOLN Place 1 drop into both eyes 3 (three) times daily as needed (dry eyes).     [facility] MELATONIN GUMMIES PO Take 5 mg by mouth at bedtime.     Docusate Sodium (DSS) 100 MG CAPS Take 100 mg by mouth every other day.     ELIQUIS 5 MG TABS tablet TAKE 1 TABLET(5 MG) BY MOUTH TWICE DAILY 180 tablet 0   empagliflozin (JARDIANCE) 10 MG TABS tablet TAKE 1 TABLET(10 MG) BY MOUTH DAILY BEFORE BREAKFAST 90 tablet 2   finasteride (PROSCAR) 5 MG tablet Take 1 tablet (5 mg total) by mouth daily. 30 tablet 1   flecainide (TAMBOCOR) 50 MG tablet TAKE 1 TABLET(50 MG) BY MOUTH EVERY 12 HOURS 180 tablet 3   LORazepam (ATIVAN) 0.5 MG tablet Take 1 tablet (0.5 mg total) by mouth at bedtime. 30 tablet 2   losartan (COZAAR) 25 MG tablet Take 1 tablet (25 mg total) by mouth daily. 90 tablet 3   methocarbamol (ROBAXIN) 500 MG tablet Take 1 tablet (500 mg total) by mouth 3 (three) times daily as needed for muscle spasms. 90 tablet 0   metoprolol succinate (TOPROL-XL) 25 MG 24 hr tablet TAKE 1 TABLET(25 MG) BY MOUTH DAILY 90 tablet 0   montelukast (SINGULAIR) 10 MG tablet TAKE 1 TABLET(10 MG) BY MOUTH AT BEDTIME 90 tablet 1   Multiple Vitamin (MULTIVITAMIN WITH MINERALS) TABS tablet Take 1 tablet by mouth daily.     pantoprazole (PROTONIX) 40 MG tablet TAKE 1 TABLET(40 MG) BY MOUTH DAILY 90 tablet 1   potassium chloride SA (KLOR-CON M) 20 MEQ tablet TAKE 1 TABLET(20 MEQ) BY MOUTH TWICE DAILY 180 tablet 0   rOPINIRole (REQUIP) 0.5 MG tablet TAKE 1  TABLET(0.5 MG) BY MOUTH THREE TIMES DAILY 270 tablet 4   sertraline (ZOLOFT) 100 MG tablet Take 2 tablets (200 mg total) by mouth at bedtime. 180 tablet 0   tirzepatide (MOUNJARO) 7.5 MG/0.5ML Pen Inject 7.5 mg into the skin once a week. 6 mL 0   torsemide (DEMADEX) 20 MG tablet TAKE 3 TABLETS BY MOUTH EVERY DAY 270 tablet 2   traMADol (ULTRAM) 50 MG tablet TAKE 1 TABLET BY MOUTH EVERY 8 HOURS AS NEEDED 90 tablet 5   zinc gluconate 50 MG tablet Take 1 tablet (50 mg total) by mouth daily. 90 tablet 1   No facility-administered medications prior to visit.    ROS Review of Systems  Objective:  BP (!) 92/48 (BP Location: Left Arm, Patient Position: Sitting, Cuff Size: Large)   Pulse 62   Temp (!) 97.5 F (36.4 C) (Temporal)   Resp 16   Ht 6' (1.829 m)   SpO2 94%   BMI 44.62 kg/m   BP Readings from Last 3 Encounters:  06/21/23 (!) 92/48  06/05/23 118/60  05/03/23 126/71    Wt Readings from Last 3 Encounters:  06/05/23 (!) 329 lb (149.2 kg)  05/03/23 (!) 337 lb (152.9 kg)  04/30/23 (!) 337 lb (  152.9 kg)    Physical Exam Vitals reviewed.  Constitutional:      General: He is not in acute distress.    Appearance: He is obese. He is not toxic-appearing or diaphoretic.  HENT:     Mouth/Throat:     Mouth: Mucous membranes are moist.  Eyes:     General: No scleral icterus.    Conjunctiva/sclera: Conjunctivae normal.  Cardiovascular:     Rate and Rhythm: Bradycardia present.     Heart sounds: No murmur heard.    No friction rub. No gallop.     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     Breath sounds: No stridor. No wheezing, rhonchi or rales.  Abdominal:     General: Abdomen is protuberant. Bowel sounds are decreased. There is no distension.     Palpations: There is no hepatomegaly, splenomegaly or mass.     Tenderness: There is no abdominal tenderness. There is no guarding or rebound.  Musculoskeletal:      Cervical back: Neck supple.     Right lower leg: 2+ Edema present.     Left lower leg: 2+ Edema present.  Lymphadenopathy:     Cervical: No cervical adenopathy.  Skin:    General: Skin is warm.  Neurological:     General: No focal deficit present.     Mental Status: Mental status is at baseline.  Psychiatric:        Mood and Affect: Mood normal.        Behavior: Behavior normal.     Lab Results  Component Value Date   WBC 10.4 01/29/2023   HGB 11.6 (L) 01/29/2023   HCT 36.0 (L) 01/29/2023   PLT 213.0 01/29/2023   GLUCOSE 104 (H) 01/29/2023   CHOL 114 01/29/2023   TRIG 126.0 01/29/2023   HDL 28.70 (L) 01/29/2023   LDLDIRECT 91.0 05/16/2018   LDLCALC 61 01/29/2023   ALT 19 02/07/2022   AST 23 02/07/2022   NA 141 01/29/2023   K 3.8 01/29/2023   CL 100 01/29/2023   CREATININE 1.12 01/29/2023   BUN 22 01/29/2023   CO2 33 (H) 01/29/2023   TSH 1.89 01/29/2023   PSA 0.31 06/19/2022   INR 1.6 (H) 12/02/2022   HGBA1C 6.1 01/29/2023   MICROALBUR 0.7 06/19/2022    ECHOCARDIOGRAM COMPLETE Result Date: 12/03/2022    ECHOCARDIOGRAM REPORT   Patient Name:   [name] Date of Exam: 12/03/2022 Medical Rec #:  [id number]         Height:       72.0 in Accession #:    [id number]        Weight:       381.6 lb Date of Birth:  [birth year]         BSA:          2.804 m Patient Age:    76 years          BP:           115/97 mmHg Patient Gender: M                 HR:           62 bpm. Exam Location:  Inpatient Procedure: 2D Echo, Cardiac Doppler, Color Doppler and Intracardiac            Opacification Agent Indications:    CHF-Acute Diastolic I50.31  History:  Patient has prior history of Echocardiogram examinations, most                 recent 05/17/2021. CHF, Stroke, Arrythmias:Atrial Fibrillation;                 Risk Factors:Diabetes, Hypertension and Dyslipidemia. Morbid                 Obesity.  Sonographer:    Celesta Gentile RCS Referring Phys: [phone] WHITNEY D HARRIS IMPRESSIONS  1.  Left ventricular ejection fraction, by estimation, is 60 to 65%. The left ventricle has normal function. The left ventricle has no regional wall motion abnormalities. There is mild concentric left ventricular hypertrophy. Left ventricular diastolic parameters are indeterminate.  2. Right ventricular systolic function is mildly reduced. The right ventricular size is moderately enlarged. There is mildly elevated pulmonary artery systolic pressure. The estimated right ventricular systolic pressure is 39.4 mmHg.  3. Left atrial size was moderately dilated.  4. Right atrial size was moderately dilated.  5. The mitral valve is normal in structure. No evidence of mitral valve regurgitation. No evidence of mitral stenosis.  6. The aortic valve is tricuspid. There is mild calcification of the aortic valve. Aortic valve regurgitation is not visualized. Aortic valve sclerosis/calcification is present, without any evidence of aortic stenosis.  7. Aortic dilatation noted. There is mild dilatation of the aortic root, measuring 41 mm.  8. The inferior vena cava is dilated in size with <50% respiratory variability, suggesting right atrial pressure of 15 mmHg. FINDINGS  Left Ventricle: Left ventricular ejection fraction, by estimation, is 60 to 65%. The left ventricle has normal function. The left ventricle has no regional wall motion abnormalities. Definity contrast agent was given IV to delineate the left ventricular  endocardial borders. The left ventricular internal cavity size was normal in size. There is mild concentric left ventricular hypertrophy. Left ventricular diastolic parameters are indeterminate. Right Ventricle: The right ventricular size is moderately enlarged. No increase in right ventricular wall thickness. Right ventricular systolic function is mildly reduced. There is mildly elevated pulmonary artery systolic pressure. The tricuspid regurgitant velocity is 2.47 m/s, and with an assumed right atrial pressure of  15 mmHg, the estimated right ventricular systolic pressure is 39.4 mmHg. Left Atrium: Left atrial size was moderately dilated. Right Atrium: Right atrial size was moderately dilated. Pericardium: There is no evidence of pericardial effusion. Mitral Valve: The mitral valve is normal in structure. No evidence of mitral valve regurgitation. No evidence of mitral valve stenosis. MV peak gradient, 8.1 mmHg. The mean mitral valve gradient is 2.0 mmHg. Tricuspid Valve: The tricuspid valve is normal in structure. Tricuspid valve regurgitation is mild . No evidence of tricuspid stenosis. Aortic Valve: The aortic valve is tricuspid. There is mild calcification of the aortic valve. Aortic valve regurgitation is not visualized. Aortic valve sclerosis/calcification is present, without any evidence of aortic stenosis. Aortic valve mean gradient measures 8.0 mmHg. Aortic valve peak gradient measures 14.1 mmHg. Aortic valve area, by VTI measures 2.49 cm. Pulmonic Valve: The pulmonic valve was not well visualized. Pulmonic valve regurgitation is not visualized. No evidence of pulmonic stenosis. Aorta: Aortic dilatation noted. There is mild dilatation of the aortic root, measuring 41 mm. Venous: The inferior vena cava is dilated in size with less than 50% respiratory variability, suggesting right atrial pressure of 15 mmHg. IAS/Shunts: No atrial level shunt detected by color flow Doppler.  LEFT VENTRICLE PLAX 2D LVIDd:  5.50 cm   Diastology LVIDs:         2.70 cm   LV e' medial:    8.49 cm/s LV PW:         1.10 cm   LV E/e' medial:  15.9 LV IVS:        1.20 cm   LV e' lateral:   13.90 cm/s LVOT diam:     2.10 cm   LV E/e' lateral: 9.7 LV SV:         114 LV SV Index:   41 LVOT Area:     3.46 cm  RIGHT VENTRICLE RV S prime:     14.60 cm/s TAPSE (M-mode): 2.2 cm LEFT ATRIUM              Index        RIGHT ATRIUM           Index LA diam:        5.40 cm  1.93 cm/m   RA Area:     20.90 cm LA Vol (A2C):   112.0 ml 39.95 ml/m  RA  Volume:   63.20 ml  22.54 ml/m LA Vol (A4C):   126.0 ml 44.94 ml/m LA Biplane Vol: 120.0 ml 42.80 ml/m  AORTIC VALVE AV Area (Vmax):    2.76 cm AV Area (Vmean):   2.64 cm AV Area (VTI):     2.49 cm AV Vmax:           188.00 cm/s AV Vmean:          134.000 cm/s AV VTI:            0.457 m AV Peak Grad:      14.1 mmHg AV Mean Grad:      8.0 mmHg LVOT Vmax:         150.00 cm/s LVOT Vmean:        102.000 cm/s LVOT VTI:          0.328 m LVOT/AV VTI ratio: 0.72  AORTA Ao Root diam: 4.10 cm MITRAL VALVE                TRICUSPID VALVE MV Area (PHT): 2.83 cm     TR Peak grad:   24.4 mmHg MV Area VTI:   2.24 cm     TR Vmax:        247.00 cm/s MV Peak grad:  8.1 mmHg MV Mean grad:  2.0 mmHg     SHUNTS MV Vmax:       1.42 m/s     Systemic VTI:  0.33 m MV Vmean:      56.5 cm/s    Systemic Diam: 2.10 cm MV Decel Time: 268 msec MV E velocity: 135.00 cm/s MV A velocity: 72.20 cm/s MV E/A ratio:  1.87 Arvilla Meres MD Electronically signed by Arvilla Meres MD Signature Date/Time: 12/03/2022/10:18:53 PM    Final    US RENAL Result Date: 12/03/2022 CLINICAL DATA:  Acute renal injury/insufficiency EXAM: RENAL / URINARY TRACT ULTRASOUND COMPLETE COMPARISON:  CT 01/25/2021 FINDINGS: Right Kidney: Renal measurements: 10.9 x 6.3 x 6.8 cm = volume: 245 mL. 4.5 cm lateral interpolar right renal hypoechoic lesion likely corresponds to a cyst on 01/25/2021 CT. Mild renal cortical thinning, without hydronephrosis. Left Kidney: Renal measurements: 11.6 x 6.8 x 6.6 cm = volume: 272 mL. Mildly decreased echogenicity. Mild renal cortical thinning. No hydronephrosis. Bladder: Not visualized Other: Moderate  degraded by patient body habitus. IMPRESSION: Moderately degraded exam secondary to  patient body habitus. No hydronephrosis or acute explanation for renal insufficiency. Electronically Signed   By: Jeronimo Greaves M.D.   On: 12/03/2022 17:22   [place] EKG SITE RITE Result Date: 12/02/2022 If Site Rite image not attached, placement could  not be confirmed due to current cardiac rhythm.  CT Angio Chest PE W and/or Wo Contrast Result Date: 12/02/2022 CLINICAL DATA:  Cough.  Pulmonary embolism suspected. EXAM: CT ANGIOGRAPHY CHEST WITH CONTRAST TECHNIQUE: Multidetector CT imaging of the chest was performed using the standard protocol during bolus administration of intravenous contrast. Multiplanar CT image reconstructions and MIPs were obtained to evaluate the vascular anatomy. RADIATION DOSE REDUCTION: This exam was performed according to the departmental dose-optimization program which includes automated exposure control, adjustment of the mA and/or kV according to patient size and/or use of iterative reconstruction technique. CONTRAST:  OMNIPAQUE IOHEXOL 350 MG/ML SOLN COMPARISON:  Chest radiograph earlier today.  CTA chest 03/22/2021 FINDINGS: Cardiovascular: The quality of this exam for evaluation of pulmonary embolism is poor to moderate. The bolus is suboptimally timed with contrast entering the SVC. Exam also degraded by patient body habitus. To the upper lungs, no embolism to the large segmental level. To the lower lungs, no embolism to the lobar to large segmental level. Smaller emboli cannot be excluded. 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 Aortic atherosclerosis. Ascending aortic dilatation is mild. Based on coronal reformats, 4.3 cm on 84/8. Increased from 4.1 cm when remeasured in a similar fashion on the prior. Moderate cardiomegaly, without pericardial effusion. Three vessel coronary artery calcification. Pulmonary artery enlargement, outflow tract 3.8 cm. Mediastinum/Nodes: No mediastinal or hilar adenopathy. Lungs/Pleura: No pleural fluid. Minimal exclusion of the left lung base. Mild volume loss and subsegmental atelectasis at the right lung base. Marked right hemidiaphragm elevation. Upper Abdomen: Lap band, incompletely imaged. Nonspecific caudate lobe enlargement. Normal imaged portions  of the spleen. Musculoskeletal: Mild convex right thoracic spine curvature. Review of the MIP images confirms the above findings. IMPRESSION: 1. Portal moderate evaluation for pulmonary embolism. No large embolism. 2. No explanation for cough 3. Coronary artery atherosclerosis. Aortic Atherosclerosis (ICD10-I70.0). 4. Pulmonary artery enlargement suggests pulmonary arterial hypertension. 5. Ascending aortic dilatation at up to 4.3 cm. Recommend annual imaging followup by CTA or MRA. This recommendation follows 2010 ACCF/AHA/AATS/ACR/ASA/SCA/SCAI/SIR/STS/SVM Guidelines for the Diagnosis and Management of Patients with Thoracic Aortic Disease. Circulation. 2010; 121: Z610-R604. Aortic aneurysm NOS (ICD10-I71.9) Electronically Signed   By: Jeronimo Greaves M.D.   On: 12/02/2022 12:11   CT Head Wo Contrast Result Date: 12/02/2022 CLINICAL DATA:  Patient fell backwards. Chronic anticoagulation. Back injury. EXAM: CT HEAD WITHOUT CONTRAST CT CERVICAL SPINE WITHOUT CONTRAST TECHNIQUE: Multidetector CT imaging of the head and cervical spine was performed following the standard protocol without intravenous contrast. Multiplanar CT image reconstructions of the cervical spine were also generated. RADIATION DOSE REDUCTION: This exam was performed according to the departmental dose-optimization program which includes automated exposure control, adjustment of the mA and/or kV according to patient size and/or use of iterative reconstruction technique. COMPARISON:  None Available. FINDINGS: CT HEAD FINDINGS Brain: There is no evidence for acute hemorrhage, hydrocephalus, mass lesion, or abnormal extra-axial fluid collection. No definite CT evidence for acute infarction. Diffuse loss of parenchymal volume is consistent with atrophy. Patchy low attenuation in the deep hemispheric and periventricular white matter is nonspecific, but likely reflects chronic microvascular ischemic demyelination. Vascular: No hyperdense vessel or  unexpected calcification. Skull: No evidence for fracture. No worrisome lytic or sclerotic  lesion. Sinuses/Orbits: The visualized paranasal sinuses and mastoid air cells are clear. Visualized portions of the globes and intraorbital fat are unremarkable. Other: None. CT CERVICAL SPINE FINDINGS Alignment: Normal. Skull base and vertebrae: No acute fracture. No primary bone lesion or focal pathologic process. Soft tissues and spinal canal: No prevertebral fluid or swelling. No visible canal hematoma. Disc levels: Diffuse loss of intervertebral disc height noted in the cervical spine. Facets are well aligned with mild degenerative changes at upper cervical levels. Upper chest: Unremarkable Other: None. IMPRESSION: 1. No acute intracranial abnormality. Atrophy with chronic small vessel white matter ischemic disease. 2. Degenerative changes in the cervical spine without fracture or subluxation. Electronically Signed   By: Kennith Center M.D.   On: 12/02/2022 10:44   CT Cervical Spine Wo Contrast Result Date: 12/02/2022 CLINICAL DATA:  Patient fell backwards. Chronic anticoagulation. Back injury. EXAM: CT HEAD WITHOUT CONTRAST CT CERVICAL SPINE WITHOUT CONTRAST TECHNIQUE: Multidetector CT imaging of the head and cervical spine was performed following the standard protocol without intravenous contrast. Multiplanar CT image reconstructions of the cervical spine were also generated. RADIATION DOSE REDUCTION: This exam was performed according to the departmental dose-optimization program which includes automated exposure control, adjustment of the mA and/or kV according to patient size and/or use of iterative reconstruction technique. COMPARISON:  None Available. FINDINGS: CT HEAD FINDINGS Brain: There is no evidence for acute hemorrhage, hydrocephalus, mass lesion, or abnormal extra-axial fluid collection. No definite CT evidence for acute infarction. Diffuse loss of parenchymal volume is consistent with atrophy. Patchy low  attenuation in the deep hemispheric and periventricular white matter is nonspecific, but likely reflects chronic microvascular ischemic demyelination. Vascular: No hyperdense vessel or unexpected calcification. Skull: No evidence for fracture. No worrisome lytic or sclerotic lesion. Sinuses/Orbits: The visualized paranasal sinuses and mastoid air cells are clear. Visualized portions of the globes and intraorbital fat are unremarkable. Other: None. CT CERVICAL SPINE FINDINGS Alignment: Normal. Skull base and vertebrae: No acute fracture. No primary bone lesion or focal pathologic process. Soft tissues and spinal canal: No prevertebral fluid or swelling. No visible canal hematoma. Disc levels: Diffuse loss of intervertebral disc height noted in the cervical spine. Facets are well aligned with mild degenerative changes at upper cervical levels. Upper chest: Unremarkable Other: None. IMPRESSION: 1. No acute intracranial abnormality. Atrophy with chronic small vessel white matter ischemic disease. 2. Degenerative changes in the cervical spine without fracture or subluxation. Electronically Signed   By: Kennith Center M.D.   On: 12/02/2022 10:44   CT Lumbar Spine Wo Contrast Result Date: 12/02/2022 CLINICAL DATA:  Lumbar radiculopathy after trauma.  Back pain. EXAM: CT LUMBAR SPINE WITHOUT CONTRAST TECHNIQUE: Multidetector CT imaging of the lumbar spine was performed without intravenous contrast administration. Multiplanar CT image reconstructions were also generated. RADIATION DOSE REDUCTION: This exam was performed according to the departmental dose-optimization program which includes automated exposure control, adjustment of the mA and/or kV according to patient size and/or use of iterative reconstruction technique. COMPARISON:  Lumbar spine MRI from 10/14/2018 FINDINGS: Segmentation: 5 lumbar type vertebrae. Alignment: Mild retrolisthesis of L4 on L5 measuring up to 3 mm. Unchanged from the previous exam. Stable  straightening of lumbar lordosis. Vertebrae: Unchanged benign vertebral hemangioma at L3-4. The lumbar vertebral body heights are well preserved without signs of acute fracture. Paraspinal and other soft tissues: Partially visualized conglomeration right kidney cysts are again noted measuring approximately 5.9 x 4.7 cm. Previously characterized as benign cysts. Aortic atherosclerotic calcifications noted. Disc levels: Multilevel disc  space narrowing and endplate spurring identified. This is severe at the L4-5 level with marked disc space narrowing, endplate spurring and vacuum disc. There is mild to moderate facet hypertrophy noted. Posterior disc bulge/protrusion is again noted. Again seen is mild bilateral lateral recess stenosis. IMPRESSION: 1. No evidence for acute fracture or subluxation of the lumbar spine. 2. Multilevel degenerative disc disease, most severe at the L4-5 level. 3.  Aortic Atherosclerosis (ICD10-I70.0). Electronically Signed   By: Signa Kell M.D.   On: 12/02/2022 10:40   DG Chest Portable 1 View Result Date: 12/02/2022 CLINICAL DATA:  Cough. EXAM: PORTABLE CHEST 1 VIEW COMPARISON:  03/22/2021 FINDINGS: Asymmetric elevation right hemidiaphragm. The cardio pericardial silhouette is enlarged. Vascular congestion with diffuse interstitial opacities suggesting edema. No focal consolidation or substantial pleural effusion. Telemetry leads overlie the chest. IMPRESSION: Vascular congestion with diffuse interstitial opacities suggesting edema. Electronically Signed   By: Kennith Center M.D.   On: 12/02/2022 09:24    Assessment & Plan:  B12 deficiency -     CBC with Differential/Platelet; Future -     Folate; Future  Type II diabetes mellitus with manifestations (HCC) -     Microalbumin / creatinine urine ratio; Future -     Hemoglobin A1c; Future -     Basic metabolic panel; Future  Atherosclerosis of aorta (HCC)  Idiopathic chronic gout of multiple sites without tophus -      Basic metabolic panel; Future  Anemia due to zinc deficiency  Hyperlipidemia with target LDL less than 130 -     Hepatic function panel; Future -     TSH; Future  Hypotension, unspecified hypotension type -     EKG 12-Lead -     Cortisol; Future -     Troponin I (High Sensitivity); Future -     Urinalysis, Routine w reflex microscopic; Future  Bradycardia on ECG     Follow-up: Return in about 3 months (around 09/21/2023).  Sanda Linger, MD

## 2023-06-21 NOTE — Patient Instructions (Signed)

## 2023-06-22 ENCOUNTER — Encounter (HOSPITAL_COMMUNITY): Payer: Self-pay | Admitting: Emergency Medicine

## 2023-06-22 ENCOUNTER — Other Ambulatory Visit: Payer: Self-pay

## 2023-06-22 ENCOUNTER — Encounter: Payer: Self-pay | Admitting: Internal Medicine

## 2023-06-22 ENCOUNTER — Emergency Department (HOSPITAL_COMMUNITY)

## 2023-06-22 ENCOUNTER — Inpatient Hospital Stay (HOSPITAL_COMMUNITY)
Admission: EM | Admit: 2023-06-22 | Discharge: 2023-06-24 | DRG: 312 | Disposition: A | Discharge status: Home Health | Attending: Internal Medicine | Admitting: Internal Medicine

## 2023-06-22 DIAGNOSIS — E669 Obesity, unspecified: Secondary | ICD-10-CM | POA: Diagnosis not present

## 2023-06-22 DIAGNOSIS — I480 Paroxysmal atrial fibrillation: Secondary | ICD-10-CM | POA: Diagnosis present

## 2023-06-22 DIAGNOSIS — T447X5A Adverse effect of beta-adrenoreceptor antagonists, initial encounter: Secondary | ICD-10-CM | POA: Diagnosis present

## 2023-06-22 DIAGNOSIS — N400 Enlarged prostate without lower urinary tract symptoms: Secondary | ICD-10-CM | POA: Diagnosis present

## 2023-06-22 DIAGNOSIS — J3800 Paralysis of vocal cords and larynx, unspecified: Secondary | ICD-10-CM | POA: Diagnosis present

## 2023-06-22 DIAGNOSIS — I517 Cardiomegaly: Secondary | ICD-10-CM | POA: Diagnosis not present

## 2023-06-22 DIAGNOSIS — Z66 Do not resuscitate: Secondary | ICD-10-CM | POA: Diagnosis present

## 2023-06-22 DIAGNOSIS — G4733 Obstructive sleep apnea (adult) (pediatric): Secondary | ICD-10-CM | POA: Diagnosis present

## 2023-06-22 DIAGNOSIS — A419 Sepsis, unspecified organism: Secondary | ICD-10-CM | POA: Diagnosis present

## 2023-06-22 DIAGNOSIS — J45909 Unspecified asthma, uncomplicated: Secondary | ICD-10-CM | POA: Diagnosis present

## 2023-06-22 DIAGNOSIS — Z823 Family history of stroke: Secondary | ICD-10-CM

## 2023-06-22 DIAGNOSIS — I4891 Unspecified atrial fibrillation: Secondary | ICD-10-CM | POA: Diagnosis not present

## 2023-06-22 DIAGNOSIS — Z79899 Other long term (current) drug therapy: Secondary | ICD-10-CM

## 2023-06-22 DIAGNOSIS — Z820 Family history of epilepsy and other diseases of the nervous system: Secondary | ICD-10-CM

## 2023-06-22 DIAGNOSIS — Z6841 Body Mass Index (BMI) 40.0 and over, adult: Secondary | ICD-10-CM | POA: Diagnosis not present

## 2023-06-22 DIAGNOSIS — E66813 Obesity, class 3: Secondary | ICD-10-CM | POA: Diagnosis present

## 2023-06-22 DIAGNOSIS — F32A Depression, unspecified: Secondary | ICD-10-CM | POA: Diagnosis present

## 2023-06-22 DIAGNOSIS — I5032 Chronic diastolic (congestive) heart failure: Secondary | ICD-10-CM | POA: Diagnosis present

## 2023-06-22 DIAGNOSIS — Z87891 Personal history of nicotine dependence: Secondary | ICD-10-CM

## 2023-06-22 DIAGNOSIS — G2581 Restless legs syndrome: Secondary | ICD-10-CM | POA: Diagnosis present

## 2023-06-22 DIAGNOSIS — I69391 Dysphagia following cerebral infarction: Secondary | ICD-10-CM | POA: Diagnosis not present

## 2023-06-22 DIAGNOSIS — I959 Hypotension, unspecified: Principal | ICD-10-CM

## 2023-06-22 DIAGNOSIS — Z886 Allergy status to analgesic agent status: Secondary | ICD-10-CM

## 2023-06-22 DIAGNOSIS — Z818 Family history of other mental and behavioral disorders: Secondary | ICD-10-CM

## 2023-06-22 DIAGNOSIS — Z83719 Family history of colon polyps, unspecified: Secondary | ICD-10-CM

## 2023-06-22 DIAGNOSIS — E11622 Type 2 diabetes mellitus with other skin ulcer: Secondary | ICD-10-CM | POA: Diagnosis not present

## 2023-06-22 DIAGNOSIS — Z7985 Long-term (current) use of injectable non-insulin antidiabetic drugs: Secondary | ICD-10-CM

## 2023-06-22 DIAGNOSIS — Z833 Family history of diabetes mellitus: Secondary | ICD-10-CM

## 2023-06-22 DIAGNOSIS — L89892 Pressure ulcer of other site, stage 2: Secondary | ICD-10-CM | POA: Diagnosis present

## 2023-06-22 DIAGNOSIS — I110 Hypertensive heart disease with heart failure: Secondary | ICD-10-CM | POA: Diagnosis present

## 2023-06-22 DIAGNOSIS — E119 Type 2 diabetes mellitus without complications: Secondary | ICD-10-CM | POA: Diagnosis present

## 2023-06-22 DIAGNOSIS — Z993 Dependence on wheelchair: Secondary | ICD-10-CM

## 2023-06-22 DIAGNOSIS — I952 Hypotension due to drugs: Principal | ICD-10-CM | POA: Diagnosis present

## 2023-06-22 DIAGNOSIS — Z96653 Presence of artificial knee joint, bilateral: Secondary | ICD-10-CM | POA: Diagnosis present

## 2023-06-22 DIAGNOSIS — F419 Anxiety disorder, unspecified: Secondary | ICD-10-CM | POA: Diagnosis present

## 2023-06-22 DIAGNOSIS — I272 Pulmonary hypertension, unspecified: Secondary | ICD-10-CM | POA: Diagnosis present

## 2023-06-22 DIAGNOSIS — Z880 Allergy status to penicillin: Secondary | ICD-10-CM

## 2023-06-22 DIAGNOSIS — Z8249 Family history of ischemic heart disease and other diseases of the circulatory system: Secondary | ICD-10-CM

## 2023-06-22 DIAGNOSIS — L89212 Pressure ulcer of right hip, stage 2: Secondary | ICD-10-CM

## 2023-06-22 DIAGNOSIS — Z1152 Encounter for screening for COVID-19: Secondary | ICD-10-CM | POA: Diagnosis not present

## 2023-06-22 DIAGNOSIS — L89321 Pressure ulcer of left buttock, stage 1: Secondary | ICD-10-CM | POA: Diagnosis present

## 2023-06-22 DIAGNOSIS — J9611 Chronic respiratory failure with hypoxia: Secondary | ICD-10-CM | POA: Diagnosis present

## 2023-06-22 DIAGNOSIS — Z7901 Long term (current) use of anticoagulants: Secondary | ICD-10-CM

## 2023-06-22 DIAGNOSIS — Z885 Allergy status to narcotic agent status: Secondary | ICD-10-CM

## 2023-06-22 DIAGNOSIS — I69398 Other sequelae of cerebral infarction: Secondary | ICD-10-CM

## 2023-06-22 DIAGNOSIS — Z86711 Personal history of pulmonary embolism: Secondary | ICD-10-CM

## 2023-06-22 DIAGNOSIS — Z7984 Long term (current) use of oral hypoglycemic drugs: Secondary | ICD-10-CM

## 2023-06-22 DIAGNOSIS — Z794 Long term (current) use of insulin: Secondary | ICD-10-CM

## 2023-06-22 DIAGNOSIS — K219 Gastro-esophageal reflux disease without esophagitis: Secondary | ICD-10-CM | POA: Diagnosis present

## 2023-06-22 DIAGNOSIS — L03317 Cellulitis of buttock: Secondary | ICD-10-CM | POA: Diagnosis present

## 2023-06-22 DIAGNOSIS — M109 Gout, unspecified: Secondary | ICD-10-CM | POA: Diagnosis present

## 2023-06-22 DIAGNOSIS — Z9049 Acquired absence of other specified parts of digestive tract: Secondary | ICD-10-CM

## 2023-06-22 HISTORY — DX: Type 2 diabetes mellitus without complications: E11.9

## 2023-06-22 LAB — CBC
HCT: 40.1 % (ref 39.0–52.0)
Hemoglobin: 12.7 g/dL — ABNORMAL LOW (ref 13.0–17.0)
MCH: 31.4 pg (ref 26.0–34.0)
MCHC: 31.7 g/dL (ref 30.0–36.0)
MCV: 99 fL (ref 80.0–100.0)
Platelets: 209 10*3/uL (ref 150–400)
RBC: 4.05 MIL/uL — ABNORMAL LOW (ref 4.22–5.81)
RDW: 13.2 % (ref 11.5–15.5)
WBC: 8 10*3/uL (ref 4.0–10.5)
nRBC: 0 % (ref 0.0–0.2)

## 2023-06-22 LAB — URINALYSIS, W/ REFLEX TO CULTURE (INFECTION SUSPECTED)
Bilirubin Urine: NEGATIVE
Glucose, UA: >=500 mg/dL — AB
Hgb urine dipstick: NEGATIVE
Ketones, ur: NEGATIVE mg/dL
Nitrite: NEGATIVE
Protein, ur: NEGATIVE mg/dL
Specific Gravity, Urine: 1.01 (ref 1.005–1.030)
pH: 6 (ref 5.0–8.0)

## 2023-06-22 LAB — TROPONIN I (HIGH SENSITIVITY): Troponin I (High Sensitivity): 7 ng/L (ref <18)

## 2023-06-22 LAB — COMPREHENSIVE METABOLIC PANEL
ALT: 19 U/L (ref 0–44)
AST: 26 U/L (ref 15–41)
Albumin: 3.2 g/dL — ABNORMAL LOW (ref 3.5–5.0)
Alkaline Phosphatase: 130 U/L — ABNORMAL HIGH (ref 38–126)
Anion gap: 12 (ref 5–15)
BUN: 29 mg/dL — ABNORMAL HIGH (ref 8–23)
CO2: 31 mmol/L (ref 22–32)
Calcium: 8.4 mg/dL — ABNORMAL LOW (ref 8.9–10.3)
Chloride: 101 mmol/L (ref 98–111)
Creatinine, Ser: 1.17 mg/dL (ref 0.61–1.24)
GFR, Estimated: >60 mL/min (ref >60)
Glucose, Bld: 98 mg/dL (ref 70–99)
Potassium: 3.5 mmol/L (ref 3.5–5.1)
Sodium: 144 mmol/L (ref 135–145)
Total Bilirubin: 0.8 mg/dL (ref 0.0–1.2)
Total Protein: 6.8 g/dL (ref 6.5–8.1)

## 2023-06-22 LAB — URINALYSIS, ROUTINE W REFLEX MICROSCOPIC
Bilirubin Urine: NEGATIVE
Hgb urine dipstick: NEGATIVE
Ketones, ur: NEGATIVE
Leukocytes,Ua: NEGATIVE
Nitrite: NEGATIVE
Specific Gravity, Urine: <=1.005 — AB (ref 1.000–1.030)
Total Protein, Urine: NEGATIVE
Urine Glucose: >=1000 — AB
Urobilinogen, UA: 1 (ref 0.0–1.0)
pH: 6 (ref 5.0–8.0)

## 2023-06-22 LAB — CBG MONITORING, ED: Glucose-Capillary: 70 mg/dL (ref 70–99)

## 2023-06-22 LAB — I-STAT CG4 LACTIC ACID, ED
Lactic Acid, Venous: 1.5 mmol/L (ref 0.5–1.9)
Lactic Acid, Venous: 1.3 mmol/L (ref 0.5–1.9)

## 2023-06-22 LAB — RESP PANEL BY RT-PCR (RSV, FLU A&B, COVID)  RVPGX2
Influenza A by PCR: NEGATIVE
Influenza B by PCR: NEGATIVE
Resp Syncytial Virus by PCR: NEGATIVE
SARS Coronavirus 2 by RT PCR: NEGATIVE

## 2023-06-22 MED ORDER — ACETAMINOPHEN 325 MG PO TABS: 650.0000 mg | ORAL_TABLET | Freq: Four times a day (QID) | ORAL | Status: DC | PRN

## 2023-06-22 MED ORDER — INSULIN ASPART 100 UNIT/ML IJ SOLN: 0.0000 [IU] | Freq: Three times a day (TID) | INTRAMUSCULAR | Status: DC

## 2023-06-22 MED ORDER — SODIUM CHLORIDE 0.9 % IV BOLUS
500.0000 mL | Freq: Once | INTRAVENOUS | Status: AC
  Administered 2023-06-22: 500 mL via INTRAVENOUS

## 2023-06-22 MED ORDER — CEFAZOLIN SODIUM-DEXTROSE 2-4 GM/100ML-% IV SOLN
2.0000 g | Freq: Three times a day (TID) | INTRAVENOUS | Status: DC
  Administered 2023-06-23 – 2023-06-24 (×4): 2 g via INTRAVENOUS
  Filled 2023-06-22 (×4): qty 100

## 2023-06-22 MED ORDER — ZINC SULFATE 220 (50 ZN) MG PO CAPS
220.0000 mg | ORAL_CAPSULE | Freq: Every evening | ORAL | Status: DC
  Administered 2023-06-23: 220 mg via ORAL
  Filled 2023-06-22: qty 1

## 2023-06-22 MED ORDER — TORSEMIDE 20 MG PO TABS: 20.0000 mg | ORAL_TABLET | Freq: Once | ORAL | Status: DC

## 2023-06-22 MED ORDER — ONDANSETRON HCL 4 MG PO TABS: 4.0000 mg | ORAL_TABLET | Freq: Four times a day (QID) | ORAL | Status: DC | PRN

## 2023-06-22 MED ORDER — METOPROLOL SUCCINATE ER 25 MG PO TB24: 25.0000 mg | ORAL_TABLET | Freq: Every day | ORAL | Status: DC

## 2023-06-22 MED ORDER — BISACODYL 5 MG PO TBEC: 5.0000 mg | DELAYED_RELEASE_TABLET | Freq: Every day | ORAL | Status: DC | PRN

## 2023-06-22 MED ORDER — APIXABAN 5 MG PO TABS
5.0000 mg | ORAL_TABLET | Freq: Two times a day (BID) | ORAL | Status: DC
Start: 2023-06-22 — End: 2023-06-24
  Administered 2023-06-22 – 2023-06-24 (×4): 5 mg via ORAL
  Filled 2023-06-22 (×4): qty 1

## 2023-06-22 MED ORDER — SODIUM CHLORIDE 0.9 % IV SOLN
1.0000 g | Freq: Once | INTRAVENOUS | Status: AC
  Administered 2023-06-22: 1 g via INTRAVENOUS
  Filled 2023-06-22: qty 10

## 2023-06-22 MED ORDER — ACETAMINOPHEN 650 MG RE SUPP: 650.0000 mg | Freq: Four times a day (QID) | RECTAL | Status: DC | PRN

## 2023-06-22 MED ORDER — FINASTERIDE 5 MG PO TABS
5.0000 mg | ORAL_TABLET | Freq: Every day | ORAL | Status: DC
Start: 2023-06-23 — End: 2023-06-24
  Administered 2023-06-23 – 2023-06-24 (×2): 5 mg via ORAL
  Filled 2023-06-22 (×2): qty 1

## 2023-06-22 MED ORDER — DOCUSATE SODIUM 100 MG PO CAPS
100.0000 mg | ORAL_CAPSULE | ORAL | Status: DC
  Administered 2023-06-23: 100 mg via ORAL
  Filled 2023-06-22: qty 1

## 2023-06-22 MED ORDER — ALLOPURINOL 100 MG PO TABS
100.0000 mg | ORAL_TABLET | Freq: Every day | ORAL | Status: DC
  Administered 2023-06-23 – 2023-06-24 (×2): 100 mg via ORAL
  Filled 2023-06-22 (×2): qty 1

## 2023-06-22 MED ORDER — TORSEMIDE 20 MG PO TABS
20.0000 mg | ORAL_TABLET | Freq: Once | ORAL | Status: AC
  Administered 2023-06-24: 20 mg via ORAL
  Filled 2023-06-22: qty 1

## 2023-06-22 MED ORDER — POLYVINYL ALCOHOL 1.4 % OP SOLN
1.0000 [drp] | Freq: Three times a day (TID) | OPHTHALMIC | Status: DC | PRN
  Filled 2023-06-22: qty 15

## 2023-06-22 MED ORDER — POTASSIUM CHLORIDE CRYS ER 20 MEQ PO TBCR
40.0000 meq | EXTENDED_RELEASE_TABLET | Freq: Every evening | ORAL | Status: DC
  Administered 2023-06-22: 40 meq via ORAL
  Filled 2023-06-22: qty 2

## 2023-06-22 MED ORDER — PANTOPRAZOLE SODIUM 40 MG PO TBEC
40.0000 mg | DELAYED_RELEASE_TABLET | Freq: Every day | ORAL | Status: DC
  Administered 2023-06-23 – 2023-06-24 (×2): 40 mg via ORAL
  Filled 2023-06-22 (×2): qty 1

## 2023-06-22 MED ORDER — SERTRALINE HCL 100 MG PO TABS
200.0000 mg | ORAL_TABLET | Freq: Every day | ORAL | Status: DC
  Administered 2023-06-22 – 2023-06-23 (×2): 200 mg via ORAL
  Filled 2023-06-22 (×2): qty 2

## 2023-06-22 MED ORDER — MELATONIN 5 MG PO TABS
5.0000 mg | ORAL_TABLET | Freq: Every day | ORAL | Status: DC
  Administered 2023-06-22 – 2023-06-23 (×2): 5 mg via ORAL
  Filled 2023-06-22 (×2): qty 1

## 2023-06-22 MED ORDER — ONDANSETRON HCL 4 MG/2ML IJ SOLN
4.0000 mg | Freq: Four times a day (QID) | INTRAMUSCULAR | Status: DC | PRN
Start: 2023-06-22 — End: 2023-06-24

## 2023-06-22 MED ORDER — LORAZEPAM 0.5 MG PO TABS
0.5000 mg | ORAL_TABLET | Freq: Every day | ORAL | Status: DC
  Administered 2023-06-22 – 2023-06-23 (×2): 0.5 mg via ORAL
  Filled 2023-06-22 (×2): qty 1

## 2023-06-22 MED ORDER — TRAMADOL HCL 50 MG PO TABS
50.0000 mg | ORAL_TABLET | Freq: Two times a day (BID) | ORAL | Status: DC | PRN
  Administered 2023-06-23 – 2023-06-24 (×3): 50 mg via ORAL
  Filled 2023-06-22 (×3): qty 1

## 2023-06-22 MED ORDER — ROPINIROLE HCL 1 MG PO TABS
0.5000 mg | ORAL_TABLET | Freq: Three times a day (TID) | ORAL | Status: DC
  Administered 2023-06-22: 0.5 mg via ORAL
  Filled 2023-06-22 (×2): qty 1

## 2023-06-22 MED ORDER — INSULIN ASPART 100 UNIT/ML IJ SOLN: 0.0000 [IU] | Freq: Every day | INTRAMUSCULAR | Status: DC

## 2023-06-22 MED ORDER — FLECAINIDE ACETATE 50 MG PO TABS
50.0000 mg | ORAL_TABLET | Freq: Two times a day (BID) | ORAL | Status: DC
Start: 2023-06-22 — End: 2023-06-24
  Administered 2023-06-22 – 2023-06-24 (×4): 50 mg via ORAL
  Filled 2023-06-22 (×5): qty 1

## 2023-06-22 NOTE — Sepsis Progress Note (Signed)
 Elink will follow per sepsis protocol.

## 2023-06-22 NOTE — ED Provider Triage Note (Signed)
 Emergency Medicine Provider Triage Evaluation Note  [name] , a 78 y.o. male  was evaluated in triage.  Pt complains of being told by his doctor to go to ER. Indicates saw pcp  yesterday, had labs which were ok, but as blood pressure was borderline line, was told to go to ER. Pt denies fever/chills/sweats. Denies blood loss, rectal bleeding or melena. No syncope. Uses home o2 2 liters - no new or worsening sob. No cp.   Review of Systems  Positive: Gen weak Negative: fever  Physical Exam  BP 95/65   Pulse (!) 57   Temp 98.2 F (36.8 C)   Resp 18   Ht 1.829 m (6')   Wt (!) 149.2 kg   SpO2 100%   BMI 44.61 kg/m  Gen:   Awake, no distress   Resp:  Normal effort cta MSK:   Moves extremities without difficulty. Symmetric edema to legs.  Neuro: motor./sens grossly intact.  Abd soft nt.   Medical Decision Making  Medically screening exam initiated at 3:29 PM.  Appropriate orders placed.  [name] was informed that the remainder of the evaluation will be completed by another provider, this initial triage assessment does not replace that evaluation, and the importance of remaining in the ED until their evaluation is complete.  Labs ordered.    [name] Laine, MD 06/22/23 [phone]

## 2023-06-22 NOTE — ED Provider Notes (Signed)
 [place] EMERGENCY DEPARTMENT AT [facility] Provider Note   CSN: [id number] Arrival date & time: 06/22/23  1506     History  Chief Complaint  Patient presents with   poss sepsis    [name] is a 78 y.o. male.  Pt is a 78 yo male with pmhx significant for BPH, HTN, Gout, depression, obesity, asthma, kidney stones, paroxysmal afib (on Eliquis), anemia, chf, cva, chf, PE, chronic lymphedema and DM.  Pt is here because his pcp sent him in for IV abx.  Pt saw his doctor yesterday and bp was low.  Labs were drawn and were stable.  Pcp concerned pt has early sepsis due from decubitus ulcers on his buttocks.  Pt said his wife has been caring for his wounds, but they are not improving and they are bleeding.  Pt denies fevers.  He said he feels fatigued.         Home Medications Prior to Admission medications   Medication Sig Start Date End Date Taking? Authorizing Provider  allopurinol (ZYLOPRIM) 100 MG tablet TAKE 1 TABLET(100 MG) BY MOUTH DAILY 04/25/23   [name], MD  atorvastatin (LIPITOR) 80 MG tablet TAKE 1 TABLET(80 MG) BY MOUTH DAILY 03/27/23   [name], MD  carboxymethylcellulose (REFRESH PLUS) 0.5 % SOLN Place 1 drop into both eyes 3 (three) times daily as needed (dry eyes).    [provider]  [facility] MELATONIN GUMMIES PO Take 5 mg by mouth at bedtime.    [provider]  Docusate Sodium (DSS) 100 MG CAPS Take 100 mg by mouth every other day. 07/25/13   Jeralyn Bennett, MD  ELIQUIS 5 MG TABS tablet TAKE 1 TABLET(5 MG) BY MOUTH TWICE DAILY 06/19/23   [name], MD  empagliflozin (JARDIANCE) 10 MG TABS tablet TAKE 1 TABLET(10 MG) BY MOUTH DAILY BEFORE BREAKFAST 11/03/22   [name], Lisette Abu, MD  finasteride (PROSCAR) 5 MG tablet Take 1 tablet (5 mg total) by mouth daily. 07/12/16   Angiulli, Mcarthur Rossetti, PA-C  flecainide (TAMBOCOR) 50 MG tablet TAKE 1 TABLET(50 MG) BY MOUTH EVERY 12 HOURS 08/29/22   [name], Lisette Abu, MD  LORazepam  (ATIVAN) 0.5 MG tablet Take 1 tablet (0.5 mg total) by mouth at bedtime. 06/11/23   Arfeen, Phillips Grout, MD  losartan (COZAAR) 25 MG tablet Take 1 tablet (25 mg total) by mouth daily. 07/24/22 07/19/23  [name], Lisette Abu, MD  methocarbamol (ROBAXIN) 500 MG tablet Take 1 tablet (500 mg total) by mouth 3 (three) times daily as needed for muscle spasms. 04/26/23   Jones Bales, NP  metoprolol succinate (TOPROL-XL) 25 MG 24 hr tablet TAKE 1 TABLET(25 MG) BY MOUTH DAILY 03/23/23   [name], Lisette Abu, MD  montelukast (SINGULAIR) 10 MG tablet TAKE 1 TABLET(10 MG) BY MOUTH AT BEDTIME 03/27/23   [name], MD  Multiple Vitamin (MULTIVITAMIN WITH MINERALS) TABS tablet Take 1 tablet by mouth daily.    [provider]  pantoprazole (PROTONIX) 40 MG tablet TAKE 1 TABLET(40 MG) BY MOUTH DAILY 03/27/23   [name], MD  potassium chloride SA (KLOR-CON M) 20 MEQ tablet TAKE 1 TABLET(20 MEQ) BY MOUTH TWICE DAILY 03/23/23   [name], Lisette Abu, MD  rOPINIRole (REQUIP) 0.5 MG tablet TAKE 1 TABLET(0.5 MG) BY MOUTH THREE TIMES DAILY 03/30/23   Jetty Duhamel D, MD  sertraline (ZOLOFT) 100 MG tablet Take 2 tablets (200 mg total) by mouth at bedtime. 06/11/23   Kathryne Sharper  T, MD  tirzepatide [facility]) 7.5 MG/0.5ML Pen Inject 7.5 mg into the skin once a week. 05/25/23   [name], MD  torsemide (DEMADEX) 20 MG tablet TAKE 3 TABLETS BY MOUTH EVERY DAY 11/20/22   [name], Lisette Abu, MD  traMADol (ULTRAM) 50 MG tablet TAKE 1 TABLET BY MOUTH EVERY 8 HOURS AS NEEDED 01/25/23   Jones Bales, NP  zinc gluconate 50 MG tablet Take 1 tablet (50 mg total) by mouth daily. 10/10/22   [name], MD      Allergies    Morphine, Penicillins, Codeine, and Propoxyphene n-acetaminophen    Review of Systems   Review of Systems  Constitutional:  Positive for fatigue.  Skin:  Positive for wound.  All other systems reviewed and are negative.   Physical Exam Updated Vital Signs BP (!) 103/47   Pulse (!) 50   Temp  98.2 F (36.8 C)   Resp 17   Ht 6' (1.829 m)   Wt (!) 149.2 kg   SpO2 100%   BMI 44.61 kg/m  Physical Exam Vitals and nursing note reviewed.  Constitutional:      Appearance: Normal appearance.  HENT:     Head: Normocephalic and atraumatic.     Right Ear: External ear normal.     Left Ear: External ear normal.     Nose: Nose normal.     Mouth/Throat:     Mouth: Mucous membranes are dry.  Eyes:     Extraocular Movements: Extraocular movements intact.     Conjunctiva/sclera: Conjunctivae normal.     Pupils: Pupils are equal, round, and reactive to light.  Cardiovascular:     Rate and Rhythm: Normal rate and regular rhythm.     Pulses: Normal pulses.     Heart sounds: Normal heart sounds.  Pulmonary:     Effort: Pulmonary effort is normal.     Breath sounds: Normal breath sounds.  Abdominal:     General: Abdomen is flat. Bowel sounds are normal.     Palpations: Abdomen is soft.  Musculoskeletal:        General: Normal range of motion.     Cervical back: Normal range of motion and neck supple.     Right lower leg: Edema present.     Left lower leg: Edema present.  Skin:    General: Skin is warm.     Capillary Refill: Capillary refill takes less than 2 seconds.     Comments: Stage 1 decub on left buttock; stage 2 on right upper thigh  Neurological:     General: No focal deficit present.     Mental Status: He is alert and oriented to person, place, and time.  Psychiatric:        Mood and Affect: Mood normal.        Behavior: Behavior normal.     ED Results / Procedures / Treatments   Labs (all labs ordered are listed, but only abnormal results are displayed) Labs Reviewed  CBC - Abnormal; Notable for the following components:      Result Value   RBC 4.05 (*)    Hemoglobin 12.7 (*)    All other components within normal limits  COMPREHENSIVE METABOLIC PANEL - Abnormal; Notable for the following components:   BUN 29 (*)    Calcium 8.4 (*)    Albumin 3.2 (*)     Alkaline Phosphatase 130 (*)    All other components within normal limits  URINALYSIS, W/ REFLEX TO  CULTURE (INFECTION SUSPECTED) - Abnormal; Notable for the following components:   Glucose, UA >=500 (*)    Leukocytes,Ua TRACE (*)    Bacteria, UA RARE (*)    All other components within normal limits  RESP PANEL BY RT-PCR (RSV, FLU A&B, COVID)  RVPGX2  CULTURE, BLOOD (ROUTINE X 2)  CULTURE, BLOOD (ROUTINE X 2)  I-STAT CG4 LACTIC ACID, ED  I-STAT CG4 LACTIC ACID, ED  TROPONIN I (HIGH SENSITIVITY)    EKG EKG Interpretation Date/Time:  Friday June 22 2023 15:56:47 EDT Ventricular Rate:  56 PR Interval:  166 QRS Duration:  166 QT Interval:  511 QTC Calculation: 494 R Axis:   73  Text Interpretation: Sinus rhythm Right bundle branch block No significant change since last tracing Confirmed by Jacalyn Lefevre [phone]) on 06/22/2023 4:45:11 PM  Radiology DG Chest Portable 1 View Result Date: 06/22/2023 CLINICAL DATA:  Sepsis EXAM: PORTABLE CHEST 1 VIEW COMPARISON:  X-ray 12/07/2022 FINDINGS: Borderline cardiopericardial silhouette. No consolidation, pneumothorax or effusion. No edema. Overlapping cardiac leads. Degenerative changes of the spine. IMPRESSION: Borderline cardiac enlargement.  No acute cardiopulmonary disease. Electronically Signed   By: Karen Kays M.D.   On: 06/22/2023 17:46    Procedures Procedures    Medications Ordered in ED Medications  sodium chloride 0.9 % bolus 500 mL (0 mLs Intravenous Stopped 06/22/23 1726)  cefTRIAXone (ROCEPHIN) 1 g in sodium chloride 0.9 % 100 mL IVPB (0 g Intravenous Stopped 06/22/23 1740)  sodium chloride 0.9 % bolus 500 mL (0 mLs Intravenous Stopped 06/22/23 1756)    ED Course/ Medical Decision Making/ A&P                                 Medical Decision Making Amount and/or Complexity of Data Reviewed Radiology: ordered.  Risk Decision regarding hospitalization.   This patient presents to the ED for concern of sepsis, this  involves an extensive number of treatment options, and is a complaint that carries with it a high risk of complications and morbidity.  The differential diagnosis includes hypotension due to meds, sepsis, infection, electrolyte abn   Co morbidities that complicate the patient evaluation  BPH, HTN, Gout, depression, obesity, asthma, kidney stones, paroxysmal afib (on Eliquis), anemia, chf, cva, chf, PE, chronic lymphedema and DM   Additional history obtained:  Additional history obtained from epic chart review  Lab Tests:  I Ordered, and personally interpreted labs.  The pertinent results include:  cbc with hgb 12.7 (stable); cmp nl, lactic nl, trop nl   Imaging Studies ordered:  I ordered imaging studies including cxr  I independently visualized and interpreted imaging which showed Borderline cardiac enlargement.  No acute cardiopulmonary disease.  I agree with the radiologist interpretation   Cardiac Monitoring:  The patient was maintained on a cardiac monitor.  I personally viewed and interpreted the cardiac monitored which showed an underlying rhythm of: nsr   Medicines ordered and prescription drug management:  I ordered medication including rocephin/fluids  for sx  Reevaluation of the patient after these medicines showed that the patient improved I have reviewed the patients home medicines and have made adjustments as needed   Test Considered:  ct   Critical Interventions:  abx   Consultations Obtained:  I requested consultation with the hospitalist (Dr. Gasper Sells),  and discussed lab and imaging findings as well as pertinent plan - she will admit   Problem List / ED Course:  Sepsis/cellulitis:  pt is given iv abx and ivfs.  Bp is improving, but is still soft.  Cultures pending.  Pt will need admission.   Reevaluation:  After the interventions noted above, I reevaluated the patient and found that they have :improved   Social Determinants of  Health:  Lives at home   Dispostion:  After consideration of the diagnostic results and the patients response to treatment, I feel that the patent would benefit from admission.          Final Clinical Impression(s) / ED Diagnoses Final diagnoses:  Hypotension, unspecified hypotension type  Cellulitis of buttock  Pressure injury of left buttock, stage 1  Pressure injury of right thigh, stage 2 (HCC)    Rx / DC Orders ED Discharge Orders     None         Jacalyn Lefevre, MD 06/22/23 1826

## 2023-06-22 NOTE — ED Triage Notes (Signed)
 The pt comes in on 2 liters of nasal 02  he reports that he uses it 24 hours  every day

## 2023-06-22 NOTE — H&P (Addendum)
 History and Physical    Patient: [name] YQM:[id number] DOB: 07/27/1945 DOA: 06/22/2023 DOS: the patient was seen and examined on 06/22/2023 PCP: [name], MD  Patient coming from: Home  Chief Complaint:  Chief Complaint  Patient presents with   poss sepsis   HPI: [name] is a 78 y.o. male with medical history significant for severe obstructive sleep apnea, restless leg syndrome, lymphedema, history of pulmonary hypertension and recurrent PE, atrial fibrillation on Eliquis, obesity is advised to come in today by his primary care physician.  The patient tells me he had an appointment yesterday with his primary care physician and his blood pressure was found to be low in the office.  Blood work was drawn.  This morning he was called and told that he should come to the emergency department.  The patient denies any fevers, chills, cough, diarrhea or change in his baseline dyspnea.  He is in his usual state of health which is fairly sedentary.  He is not able to exert himself much because of his breathing.  He basically just sits in chair all day.  He has 2 known bedsores 1 on his left buttocks and 1 on the top of his thigh on the right.  He is not getting any type of wound care help.  He just showers and his wife puts gauze over it.  He asks about some sort of ointment that he can use to help them heal.   Review of Systems: As mentioned in the history of present illness. All other systems reviewed and are negative. Past Medical History:  Diagnosis Date   Anemia 07/29/2013   Asthma    childhood asthma   Benign essential HTN    Benign prostatic hypertrophy    several prostate biopsies neg for cancer.    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   FH: colonic polyps 2010   Gout    History of kidney stones    Hypertension    Kidney cysts    Morbid obesity (HCC)    Numbness    feet / legs   PAF (paroxysmal atrial fibrillation) with RVR 07/29/2013   Pericarditis 2015   s/p window   Recurrent pulmonary emboli (HCC) 06/13/2015   Sleep apnea    USES C PAP   Stroke syndrome    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   Past Surgical History:  Procedure Laterality Date   ARTHROSCOPY KNEE W/ DRILLING     BREATH TEK H PYLORI N/A 12/23/2012   Procedure: BREATH TEK H PYLORI;  Surgeon: Atilano Ina, MD;  Location: [facility];  Service: General;  Laterality: N/A;   CHOLECYSTECTOMY  1989   COLONOSCOPY N/A 07/31/2013   Procedure: COLONOSCOPY;  Surgeon: Iva Boop, MD;  Location: [facility] ENDOSCOPY;  Service: Endoscopy;  Laterality: N/A;   ESOPHAGOGASTRODUODENOSCOPY N/A 07/22/2013   Procedure: ESOPHAGOGASTRODUODENOSCOPY (EGD);  Surgeon: Hilarie Fredrickson, MD;  Location: [facility] ENDOSCOPY;  Service: Endoscopy;  Laterality: N/A;   ESOPHAGOGASTRODUODENOSCOPY N/A 07/31/2013   Procedure: ESOPHAGOGASTRODUODENOSCOPY (EGD);  Surgeon: Iva Boop, MD;  Location: [facility] ENDOSCOPY;  Service: Endoscopy;  Laterality: N/A;   INTRAOPERATIVE TRANSESOPHAGEAL ECHOCARDIOGRAM N/A 07/18/2013   Procedure: INTRAOPERATIVE TRANSESOPHAGEAL ECHOCARDIOGRAM;  Surgeon: Delight Ovens, MD;  Location: [facility] OR;  Service: Open Heart Surgery;  Laterality: N/A;   LAPAROSCOPIC GASTRIC BANDING WITH HIATAL HERNIA REPAIR N/A 04/08/2013   Procedure: LAPAROSCOPIC GASTRIC BANDING WITH possible  HIATAL HERNIA REPAIR;  Surgeon: Atilano Ina, MD;  Location: [facility] ORS;  Service: General;  Laterality: N/A;   LITHOTRIPSY     renal calculi     RIGHT AND LEFT HEART CATH N/A 05/12/2021   Procedure: RIGHT AND LEFT HEART CATH;  Surgeon: Dolores Patty, MD;   Location: [facility] INVASIVE CV LAB;  Service: Cardiovascular;  Laterality: N/A;   SUBXYPHOID PERICARDIAL WINDOW N/A 07/18/2013   Procedure: SUBXYPHOID PERICARDIAL WINDOW;  Surgeon: Delight Ovens, MD;  Location: [facility] OR;  Service: Thoracic;  Laterality: N/A;   TONSILLECTOMY     total knee replacment     bilat   trigger finger repair     also lue, cts surgically repaired   WISDOM TOOTH EXTRACTION     Social History:  reports that he quit smoking about 48 years ago. His smoking use included cigarettes. He started smoking about 62 years ago. He has a 2.7 pack-year smoking history. He has never used smokeless tobacco. He reports that he does not drink alcohol and does not use drugs.  Allergies  Allergen Reactions   Morphine Nausea And Vomiting and Other (See Comments)    Severe hallucinations Other reaction(s): Hallucinations   Penicillins Other (See Comments)    Passed out after injection Did it involve swelling of the face/tongue/throat, SOB, or low BP? Unknown Did it involve sudden or severe rash/hives, skin peeling, or any reaction on the inside of your mouth or nose? [name] Did you need to seek medical attention at a hospital or doctor's office? Yes When did it last happen?    teenager   If all above answers are "[name]", may proceed with cephalosporin use. Other reaction(s): Dizziness, Unknown   Codeine Nausea Only   Propoxyphene N-Acetaminophen Nausea Only    Family History  Problem Relation Age of Onset   Depression Mother    Hypertension Mother    Dementia Mother    Heart disease Father        MI   Depression Brother    Stroke Paternal Aunt        CVA   Diabetes Paternal Uncle    Heart disease Paternal Uncle        MI   Heart disease Paternal Grandmother        MI   Diabetes Cousin        PATERNAL    Prior to Admission medications   Medication Sig Start Date End Date Taking? Authorizing Provider  allopurinol (ZYLOPRIM) 100 MG tablet TAKE 1 TABLET(100 MG) BY MOUTH DAILY  04/25/23  Yes [name], MD  atorvastatin (LIPITOR) 80 MG tablet TAKE 1 TABLET(80 MG) BY MOUTH DAILY 03/27/23  Yes [name], MD  carboxymethylcellulose (REFRESH PLUS) 0.5 % SOLN Place 1 drop into both eyes 3 (three) times daily as needed (dry eyes).   Yes [provider]  [facility] MELATONIN GUMMIES PO Take 5 mg by mouth at bedtime.   Yes [provider]  Docusate Sodium (DSS) 100 MG CAPS Take 100 mg by mouth every other day. 07/25/13  Yes Jeralyn Bennett, MD  ELIQUIS 5 MG TABS tablet TAKE 1 TABLET(5 MG) BY MOUTH TWICE DAILY 06/19/23  Yes [name], MD  empagliflozin (JARDIANCE) 10 MG TABS tablet TAKE 1 TABLET(10 MG) BY MOUTH DAILY BEFORE BREAKFAST 11/03/22  Yes [name], Lisette Abu, MD  finasteride (PROSCAR) 5 MG tablet Take 1 tablet (5 mg total) by mouth daily. 07/12/16  Yes Angiulli, Mcarthur Rossetti, PA-C  flecainide (TAMBOCOR) 50 MG tablet TAKE 1 TABLET(50 MG) BY MOUTH EVERY 12 HOURS 08/29/22  Yes [name], Lisette Abu, MD  LORazepam (ATIVAN) 0.5 MG tablet Take 1 tablet (0.5 mg total) by mouth at bedtime. 06/11/23  Yes Arfeen, Phillips Grout, MD  losartan (COZAAR) 25 MG tablet Take 1 tablet (25 mg total) by mouth daily. 07/24/22 07/19/23 Yes [name], Lisette Abu, MD  methocarbamol (ROBAXIN) 500 MG tablet Take 1 tablet (500 mg total) by mouth 3 (three) times daily as needed for muscle spasms. 04/26/23  Yes Jones Bales, NP  metoprolol succinate (TOPROL-XL) 25 MG 24 hr tablet TAKE 1 TABLET(25 MG) BY MOUTH DAILY 03/23/23  Yes [name], Lisette Abu, MD  montelukast (SINGULAIR) 10 MG tablet TAKE 1 TABLET(10 MG) BY MOUTH AT BEDTIME 03/27/23  Yes [name], MD  Multiple Vitamin (MULTIVITAMIN WITH MINERALS) TABS tablet Take 1 tablet by mouth daily.   Yes [provider]  pantoprazole (PROTONIX) 40 MG tablet TAKE 1 TABLET(40 MG) BY MOUTH DAILY 03/27/23  Yes [name], MD  potassium chloride SA (KLOR-CON M) 20 MEQ tablet TAKE 1 TABLET(20 MEQ) BY MOUTH TWICE DAILY Patient taking differently:  Take 40 mEq by mouth every evening. 03/23/23  Yes [name], Lisette Abu, MD  rOPINIRole (REQUIP) 0.5 MG tablet TAKE 1 TABLET(0.5 MG) BY MOUTH THREE TIMES DAILY 03/30/23  Yes Young, Joni Fears D, MD  sertraline (ZOLOFT) 100 MG tablet Take 2 tablets (200 mg total) by mouth at bedtime. 06/11/23  Yes Arfeen, Phillips Grout, MD  tirzepatide [facility]) 7.5 MG/0.5ML Pen Inject 7.5 mg into the skin once a week. 05/25/23  Yes [name], MD  torsemide (DEMADEX) 20 MG tablet TAKE 3 TABLETS BY MOUTH EVERY DAY 11/20/22  Yes [name], Lisette Abu, MD  traMADol (ULTRAM) 50 MG tablet TAKE 1 TABLET BY MOUTH EVERY 8 HOURS AS NEEDED Patient taking differently: Take 50 mg by mouth 2 (two) times daily as needed for moderate pain (pain score 4-6). TAKE 1 TABLET BY MOUTH EVERY 8 HOURS AS NEEDED 01/25/23  Yes Jacalyn Lefevre L, NP  zinc gluconate 50 MG tablet Take 1 tablet (50 mg total) by mouth daily. Patient taking differently: Take 50 mg by mouth every evening. 10/10/22  Yes [name], MD    Physical Exam: Vitals:   06/22/23 1745 06/22/23 1800 06/22/23 1900 06/22/23 1929  BP:  114/70 (!) 129/50 (!) 129/50  Pulse: (!) 51 (!) 48 (!) 52 (!) 53  Resp: 13 16 (!) 22 14  Temp:    98.2 F (36.8 C)  TempSrc:    Oral  SpO2: 100% 100% 100% 100%  Weight:      Height:       Physical Exam:  General: [name] acute distress, well developed, well nourished HEENT: Normocephalic, atraumatic, PERRL Cardiovascular: Normal rhythm, bradycardia. Distal pulses intact. Pulmonary: Normal pulmonary effort, normal breath sounds Gastrointestinal: Nondistended abdomen, soft, non-tender, normoactive bowel sounds Musculoskeletal:Normal ROM, lymphedema bilaterally Skin: Skin is warm and dry.  2 decubitus ulcers reported. Neuro: [name] focal deficits noted, AAOx3. PSYCH: Attentive and cooperative  Data Reviewed:  Results for orders placed or performed during the hospital encounter of 06/22/23 (from the past 24 hours)  CBC  Status: Abnormal   Collection  Time: 06/22/23  3:29 PM  Result Value Ref Range   WBC 8.0 4.0 - 10.5 K/uL   RBC 4.05 (L) 4.22 - 5.81 MIL/uL   Hemoglobin 12.7 (L) 13.0 - 17.0 g/dL   HCT 10.9 60.4 - 54.0 %   MCV 99.0 80.0 - 100.0 fL   MCH 31.4 26.0 - 34.0 pg   MCHC 31.7 30.0 - 36.0 g/dL   RDW 98.1 19.1 - 47.8 %   Platelets 209 150 - 400 K/uL   nRBC 0.0 0.0 - 0.2 %  Comprehensive metabolic panel     Status: Abnormal   Collection Time: 06/22/23  3:29 PM  Result Value Ref Range   Sodium 144 135 - 145 mmol/L   Potassium 3.5 3.5 - 5.1 mmol/L   Chloride 101 98 - 111 mmol/L   CO2 31 22 - 32 mmol/L   Glucose, Bld 98 70 - 99 mg/dL   BUN 29 (H) 8 - 23 mg/dL   Creatinine, Ser 2.95 0.61 - 1.24 mg/dL   Calcium 8.4 (L) 8.9 - 10.3 mg/dL   Total Protein 6.8 6.5 - 8.1 g/dL   Albumin 3.2 (L) 3.5 - 5.0 g/dL   AST 26 15 - 41 U/L   ALT 19 0 - 44 U/L   Alkaline Phosphatase 130 (H) 38 - 126 U/L   Total Bilirubin 0.8 0.0 - 1.2 mg/dL   GFR, Estimated >62 >13 mL/min   Anion gap 12 5 - 15  Troponin I (High Sensitivity)     Status: None   Collection Time: 06/22/23  3:29 PM  Result Value Ref Range   Troponin I (High Sensitivity) 7 <18 ng/L  I-Stat CG4 Lactic Acid     Status: None   Collection Time: 06/22/23  3:53 PM  Result Value Ref Range   Lactic Acid, Venous 1.5 0.5 - 1.9 mmol/L  Resp panel by RT-PCR (RSV, Flu A&B, Covid) Anterior Nasal Swab     Status: None   Collection Time: 06/22/23  4:34 PM   Specimen: Anterior Nasal Swab  Result Value Ref Range   SARS Coronavirus 2 by RT PCR NEGATIVE NEGATIVE   Influenza A by PCR NEGATIVE NEGATIVE   Influenza B by PCR NEGATIVE NEGATIVE   Resp Syncytial Virus by PCR NEGATIVE NEGATIVE  Urinalysis, w/ Reflex to Culture (Infection Suspected) -Urine, Clean Catch     Status: Abnormal   Collection Time: 06/22/23  4:38 PM  Result Value Ref Range   Specimen Source URINE, CLEAN CATCH    Color, Urine YELLOW YELLOW   APPearance CLEAR CLEAR   Specific Gravity, Urine 1.010 1.005 - 1.030   pH 6.0  5.0 - 8.0   Glucose, UA >=500 (A) NEGATIVE mg/dL   Hgb urine dipstick NEGATIVE NEGATIVE   Bilirubin Urine NEGATIVE NEGATIVE   Ketones, ur NEGATIVE NEGATIVE mg/dL   Protein, ur NEGATIVE NEGATIVE mg/dL   Nitrite NEGATIVE NEGATIVE   Leukocytes,Ua TRACE (A) NEGATIVE   Squamous Epithelial / HPF 0-5 0 - 5 /HPF   WBC, UA 0-5 0 - 5 WBC/hpf   RBC / HPF 0-5 0 - 5 RBC/hpf   Bacteria, UA RARE (A) NONE SEEN  I-Stat CG4 Lactic Acid     Status: None   Collection Time: 06/22/23  6:18 PM  Result Value Ref Range   Lactic Acid, Venous 1.3 0.5 - 1.9 mmol/L   *Note: Due to a large number of results and/or encounters for the requested time period, some results have  not been displayed. A complete set of results can be found in Results Review.     Assessment and Plan: Hypotension - Because of patient's prolonged hospitalization with sepsis due to heel ulcer six months ago, sepsis is suspected but patient is afebrile with normal wbc and [name] clear source.  Infection of decubiti is a possibility there was some reddness of the surrounding skin but not severe.   - Sepsis protocol was followed.  He received IV fluids, blood cultures were drawn and empiric antibiotics started.  He is on multiple antihypertensives and his heart rate is in the 40s.  I think it is possible that his hypotension is medication related. - Hold Beta blocker, anti hypertensives, and diuretics x 24 hours - Await blood cultures - The cellulitis order set recommends Ancef. - wound care consult  2. PAH  - Continue nightly Bipap and 2L O2 [place] at all times - Consider outpatient pulmonary rehab referral  3. DMT2 -corrective dose insulin as needed.  Resume Mounjaro at discharge.  4. Bradycardia -holding metoprolol  5.  History of PE A-fib -continue Eliquis and flecainide.  Keep potassium greater than 4 and magnesium greater than 2 because of his antiarrhythmics.    Advance Care Planning:   Code Status: Do not attempt resuscitation (DNR) -  Comfort care the patient names his wife as a surrogate decision maker and he wants to be DNR.  He says he and his wife are both DNR and their advanced directive paperwork.  Consults: none  Family Communication: none  Severity of Illness: The appropriate patient status for this patient is INPATIENT. Inpatient status is judged to be reasonable and necessary in order to provide the required intensity of service to ensure the patient's safety. The patient's presenting symptoms, physical exam findings, and initial radiographic and laboratory data in the context of their chronic comorbidities is felt to place them at high risk for further clinical deterioration. Furthermore, it is not anticipated that the patient will be medically stable for discharge from the hospital within 2 midnights of admission.   * I certify that at the point of admission it is my clinical judgment that the patient will require inpatient hospital care spanning beyond 2 midnights from the point of admission due to high intensity of service, high risk for further deterioration and high frequency of surveillance required.*  Author: Buena Irish, MD 06/22/2023 8:38 PM  For on call review [email or web address].

## 2023-06-22 NOTE — Telephone Encounter (Signed)
 Copied from CRM [id number]. Topic: Clinical - Medical Advice >> Jun 22, 2023 12:22 PM Denese Killings wrote: Reason for CRM: Patient wants to let Dr. Yetta Barre know that he is willing to be admitted to do IV Antibiotics if necessary.

## 2023-06-22 NOTE — ED Triage Notes (Signed)
 The pt saw his doctor yesterday and had blood drawn  today he was called and told to come to the hospital for a possible septic workup

## 2023-06-23 DIAGNOSIS — A419 Sepsis, unspecified organism: Secondary | ICD-10-CM | POA: Diagnosis not present

## 2023-06-23 LAB — BASIC METABOLIC PANEL
Anion gap: 9 (ref 5–15)
BUN: 28 mg/dL — ABNORMAL HIGH (ref 8–23)
CO2: 30 mmol/L (ref 22–32)
Calcium: 8 mg/dL — ABNORMAL LOW (ref 8.9–10.3)
Chloride: 103 mmol/L (ref 98–111)
Creatinine, Ser: 1.1 mg/dL (ref 0.61–1.24)
GFR, Estimated: >60 mL/min (ref >60)
Glucose, Bld: 131 mg/dL — ABNORMAL HIGH (ref 70–99)
Potassium: 3.2 mmol/L — ABNORMAL LOW (ref 3.5–5.1)
Sodium: 142 mmol/L (ref 135–145)

## 2023-06-23 LAB — CBC
HCT: 35.6 % — ABNORMAL LOW (ref 39.0–52.0)
Hemoglobin: 11.4 g/dL — ABNORMAL LOW (ref 13.0–17.0)
MCH: 31 pg (ref 26.0–34.0)
MCHC: 32 g/dL (ref 30.0–36.0)
MCV: 96.7 fL (ref 80.0–100.0)
Platelets: 196 10*3/uL (ref 150–400)
RBC: 3.68 MIL/uL — ABNORMAL LOW (ref 4.22–5.81)
RDW: 13.3 % (ref 11.5–15.5)
WBC: 9.6 10*3/uL (ref 4.0–10.5)
nRBC: 0 % (ref 0.0–0.2)

## 2023-06-23 LAB — GLUCOSE, CAPILLARY
Glucose-Capillary: 99 mg/dL (ref 70–99)
Glucose-Capillary: 101 mg/dL — ABNORMAL HIGH (ref 70–99)
Glucose-Capillary: 105 mg/dL — ABNORMAL HIGH (ref 70–99)
Glucose-Capillary: 87 mg/dL (ref 70–99)

## 2023-06-23 LAB — TSH: TSH: 3.119 u[IU]/mL (ref 0.350–4.500)

## 2023-06-23 LAB — MAGNESIUM: Magnesium: 2.3 mg/dL (ref 1.7–2.4)

## 2023-06-23 MED ORDER — ROPINIROLE HCL 1 MG PO TABS
1.5000 mg | ORAL_TABLET | Freq: Every day | ORAL | Status: DC
  Administered 2023-06-23: 1.5 mg via ORAL
  Filled 2023-06-23: qty 2

## 2023-06-23 MED ORDER — MUPIROCIN CALCIUM 2 % EX CREA
TOPICAL_CREAM | Freq: Two times a day (BID) | CUTANEOUS | Status: DC
Start: 2023-06-23 — End: 2023-06-24
  Filled 2023-06-23: qty 15

## 2023-06-23 MED ORDER — POTASSIUM CHLORIDE CRYS ER 20 MEQ PO TBCR
40.0000 meq | EXTENDED_RELEASE_TABLET | ORAL | Status: AC
  Administered 2023-06-23 (×2): 40 meq via ORAL
  Filled 2023-06-23 (×2): qty 2

## 2023-06-23 NOTE — Evaluation (Signed)
 Physical Therapy Evaluation Patient Details Name: [name] MRN: [id number] DOB: 1946-02-17 Today's Date: 06/23/2023  History of Present Illness  [name] is a 78 y.o. male admitted 06/22/23 for possible sepsis. He was advised to come by his PCP. Pt is afebrile with normal wbc and no clear source. Infection of decubiti is a possibility there was some reddness of the surrounding skin but not severe. Awaiting blood cultures. PMH significant for severe obstructive sleep apnea, restless leg syndrome, lymphedema, history of pulmonary hypertension and recurrent PE, atrial fibrillation on Eliquis, and obesity.   Clinical Impression  Pt admitted with above diagnosis. PTA, pt was modI for functional mobility utilizing a rollator or quad cane for household ambulation and motorized scooter for community ambulation, independent for most ADLs, driving, and requires 2L O2 at baseline. Pt requires assistance with stairs, dressing, and wound management. He resides with his wife in a one story house that has 7 STE with BHR. Pt currently with functional limitations due to the deficits listed below (see PT Problem List).He required CGA for transfers and gait using RW and/or quad cane during today's session and demonstrated slight unsteadiness. Pt will benefit from acute skilled PT to increase his independence and safety with mobility to allow d/c Home with HHPT.    If plan is discharge home, recommend the following: A little help with walking and/or transfers;A little help with bathing/dressing/bathroom;Help with stairs or ramp for entrance;Assist for transportation;Assistance with cooking/housework   Can travel by private vehicle        Equipment Recommendations None recommended by PT (Pt already has DME)  Recommendations for Other Services       Functional Status Assessment Patient has had a recent decline in their functional status and/or demonstrates limited ability to make significant  improvements in function in a reasonable and predictable amount of time     Precautions / Restrictions Precautions Precautions: Fall Recall of Precautions/Restrictions: Intact Restrictions Weight Bearing Restrictions Per Provider Order: No      Mobility  Bed Mobility Overal bed mobility: Modified Independent             General bed mobility comments: Pt sat up on L side of bed with HOB elevated and use of bedrails. PT managed lines/leads.    Transfers Overall transfer level: Needs assistance Equipment used: Rolling walker (2 wheels), Quad cane Transfers: Sit to/from Stand, Bed to chair/wheelchair/BSC Sit to Stand: Supervision   Step pivot transfers: Contact guard assist       General transfer comment: Pt stood from lowest bed height using RW. He demonstrated proper hand positioning and was able to power up without physical assistance. Pt transferred to recliner chair using quad cane in RUE, intermittent furniture walking with LUE, and CGA for safety. PT mangaged lines/leads.    Ambulation/Gait Ambulation/Gait assistance: Contact guard assist Gait Distance (Feet): 40 Feet (1x40 from EOB into hallway and back, seated rest, 1x10 from EOB to recliner chair) Assistive device: Rolling walker (2 wheels), Quad cane Gait Pattern/deviations: Step-to pattern, Decreased stride length, Trunk flexed Gait velocity: reduced Gait velocity interpretation: <1.31 ft/sec, indicative of household ambulator   General Gait Details: Pt ambulated the longer distance using RW and shorter distance using quad cane. He took short steps, good foot clearence, fwd flex posture, and even weight shift. Pt manuevered well keeping the AD close to him.  Stairs            Wheelchair Mobility     Tilt Bed  Modified Rankin (Stroke Patients Only)       Balance Overall balance assessment: Mild deficits observed, not formally tested (Pt reports impaired balance since CVA in 2018.)                                            Pertinent Vitals/Pain Pain Assessment Pain Assessment: No/denies pain    Home Living Family/patient expects to be discharged to:: Private residence Living Arrangements: Spouse/significant other Available Help at Discharge: Family;Available 24 hours/day Type of Home: House Home Access: Stairs to enter Entrance Stairs-Rails: Right;Left;Can reach both Entrance Stairs-Number of Steps: 7   Home Layout: One level Home Equipment: Rollator (4 wheels);Electric scooter;Shower seat;Other (comment);Grab bars - tub/shower;Grab bars - toilet;Hand held shower head;Cane - quad (Handicap Zenaida Niece; Supplemental Oxygen) Additional Comments: scooter stays in [place]; On 2L O2 24/7, has a pulse oximeter.    Prior Function Prior Level of Function : Needs assist       Physical Assist : Mobility (physical);ADLs (physical) Mobility (physical): Stairs ADLs (physical): Dressing;IADLs Mobility Comments: ModI for mobility with cane or rollator for household ambulation; ModI with motorized scooter for community ambulation. 2 falls in the last 11mo. Pt reports sitting in a office chair for a prolonged time during the day. ADLs Comments: Indep with most ADLs. Requires assistance with putting on compression stockings and wound dressing changes. Wife takes care of household chores. Manages his own medications.     Extremity/Trunk Assessment   Upper Extremity Assessment Upper Extremity Assessment: Defer to OT evaluation    Lower Extremity Assessment Lower Extremity Assessment: RLE deficits/detail;LLE deficits/detail RLE Deficits / Details: Grossly 3-/5 strength. Pt presented with a stage II pressure injury to his R thigh. He appears to LE edema and cellulitis. RLE Sensation: decreased light touch RLE Coordination: decreased gross motor LLE Deficits / Details: Grossly 3/5 strength. Pt presented with a stage I pressure injury to his mid buttock. He appears to LE edema and  cellulitis. LLE Sensation: decreased light touch LLE Coordination: decreased fine motor    Cervical / Trunk Assessment Cervical / Trunk Assessment: Other exceptions Cervical / Trunk Exceptions: Body Habitus  Communication   Communication Communication: Impaired Factors Affecting Communication: Hearing impaired    Cognition Arousal: Alert Behavior During Therapy: WFL for tasks assessed/performed   PT - Cognitive impairments: No apparent impairments                       PT - Cognition Comments: Pt A,Ox4. Following commands: Intact       Cueing Cueing Techniques: Verbal cues     General Comments General comments (skin integrity, edema, etc.): VSS on 2L O2. Discussed the importance of weight shifts and off loading to help pt's skin integrity and reviewed areas on the body that are at higher risk for pressure injuries.    Exercises     Assessment/Plan    PT Assessment Patient needs continued PT services  PT Problem List Decreased strength;Decreased activity tolerance;Decreased balance;Decreased mobility       PT Treatment Interventions DME instruction;Gait training;Functional mobility training;Stair training;Therapeutic activities;Therapeutic exercise;Balance training;Patient/family education    PT Goals (Current goals can be found in the Care Plan section)  Acute Rehab PT Goals Patient Stated Goal: Return Home and be less sedentary PT Goal Formulation: With patient Time For Goal Achievement: 07/07/23 Potential to Achieve Goals: Fair    Frequency  Min 2X/week     Co-evaluation               AM-PAC PT "6 Clicks" Mobility  Outcome Measure Help needed turning from your back to your side while in a flat bed without using bedrails?: A Little Help needed moving from lying on your back to sitting on the side of a flat bed without using bedrails?: A Little Help needed moving to and from a bed to a chair (including a wheelchair)?: A Little Help needed  standing up from a chair using your arms (e.g., wheelchair or bedside chair)?: A Little Help needed to walk in hospital room?: A Little Help needed climbing 3-5 steps with a railing? : A Lot 6 Click Score: 17    End of Session Equipment Utilized During Treatment: Gait belt;Oxygen Activity Tolerance: Patient tolerated treatment well Patient left: in chair;with call bell/phone within reach Nurse Communication: Mobility status PT Visit Diagnosis: Muscle weakness (generalized) (M62.81);Unsteadiness on feet (R26.81);Difficulty in walking, not elsewhere classified (R26.2);Other abnormalities of gait and mobility (R26.89)    Time: 1400-1446 PT Time Calculation (min) (ACUTE ONLY): 46 min   Charges:   PT Evaluation $PT Eval Moderate Complexity: 1 Mod PT Treatments $Gait Training: 8-22 mins PT General Charges $$ ACUTE PT VISIT: 1 Visit         Cheri Guppy, PT, DPT Acute Rehabilitation Services Office: [phone] Secure Chat Preferred  Richardson Chiquito 06/23/2023, 3:43 PM

## 2023-06-23 NOTE — Progress Notes (Addendum)
 PROGRESS NOTE    [name]  QMV:[id number] DOB: 15-Jan-1946 DOA: 06/22/2023 PCP: [name], MD   Brief Narrative:  78 y.o. male with medical history significant for severe obstructive sleep apnea, restless leg syndrome, lymphedema, history of pulmonary hypertension and recurrent PE, atrial fibrillation on Eliquis, obesity was seen at PCP office on 06/22/2023 and was referred to the ED because of low blood pressure and concern for infection of the sores on his back.  On presentation, blood pressure was on the lower side.  Labs were unremarkable.  He was started on IV antibiotics.  Assessment & Plan:   Hypotension History of hypertension -Questionable cause.  Patient currently on empiric antibiotics for concern for decubitus ulcer infection.  On exam, patient has some erythema of posterior right thigh.  Does not look grossly infected.  Continue another day of IV antibiotics.  Will add mupirocin ointment. -Doubt the patient has sepsis -Antihypertensives on hold  Posterior right thigh stage II pressure injury: Present on admission Stage I mid buttock pressure injury: Present on admission--wound care consult.  Topical antibiotic plan as above.  Chronic diastolic heart failure  PAH OSA Chronic respiratory failure with hypoxia Morbid obesity/obesity class III -Continue nightly BiPAP and supplemental nasal cannula oxygen during daytime -Currently compensated.  Strict input output.  Daily weights.  Fluid restriction.  Continue torsemide.  Outpatient follow-up with cardiology  Paroxysmal A-fib -Rate mostly controlled.  Continue flecainide, Eliquis.  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  Patient  is recently mostly wheelchair-bound.   DVT prophylaxis: Eliquis Code Status: DNR Family Communication: None at bedside Disposition Plan: Status is: Inpatient Remains inpatient appropriate because: Of severity of illness    Consultants: None  Procedures: None  Antimicrobials: Ancef from 06/22/2023 onwards   Subjective: Patient seen and examined at bedside.  Feels slightly better.  Denies worsening shortness breath, chest pain, fever or vomiting.  Still feels weak.  Objective: Vitals:   06/23/23 0100 06/23/23 0139 06/23/23 0819 06/23/23 0900  BP: (!) 119/105  (!) 124/59   Pulse: 66 (!) 57 (!) 50 (!) 47  Resp: 20 (!) 22 17 11   Temp: 97.7 F (36.5 C)  97.6 F (36.4 C)   TempSrc: Oral  Axillary   SpO2: 100% 98% 93% 93%  Weight:      Height:       No intake or output data in the 24 hours ending 06/23/23 1052 Filed Weights   06/22/23 1526  Weight: (!) 149.2 kg    Examination:  General exam: Appears calm and comfortable.  On 2 L oxygen by nasal cannula. Respiratory system: Bilateral decreased breath sounds at bases with scattered crackles Cardiovascular system: S1 & S2 heard, intermittently tachycardic Gastrointestinal system: Abdomen is morbidly obese, nondistended, soft and nontender. Normal bowel sounds heard. Extremities: No cyanosis, clubbing, edema  Central nervous system: Alert and oriented. No focal neurological deficits. Moving extremities Skin: Posterior right thigh stage II pressure injury Stage I mid buttock pressure injury Psychiatry: Flat affect.  Not agitated.     Data Reviewed: I have personally reviewed following labs and imaging studies  CBC: Recent Labs  Lab 06/21/23 1535 06/22/23 1529 06/23/23 0306  WBC 9.0 8.0 9.6  NEUTROABS 6.7  --   --   HGB 12.2* 12.7* 11.4*  HCT 37.6* 40.1 35.6*  MCV 95.8 99.0 96.7  PLT 210.0 209 196   Basic Metabolic Panel: Recent Labs  Lab 06/21/23 1535 06/22/23 1529 06/23/23 0306  NA 143 144 142  K 4.4  3.5 3.2*  CL 101 101 103  CO2 35* 31 30  GLUCOSE 127* 98 131*  BUN 35* 29* 28*  CREATININE 1.01 1.17 1.10  CALCIUM 8.6 8.4* 8.0*  MG  --   --  2.3   GFR: Estimated Creatinine Clearance: 84.5 mL/min (by C-G formula based on SCr of 1.1 mg/dL). Liver Function Tests: Recent Labs  Lab 06/21/23 1535 06/22/23 1529  AST 21 26  ALT 14 19  ALKPHOS 139* 130*  BILITOT 0.6 0.8  PROT 6.5 6.8  ALBUMIN 3.4* 3.2*   No results for input(s): "LIPASE", "AMYLASE" in the last 168 hours. No results for input(s): "AMMONIA" in the last 168 hours. Coagulation Profile: No results for input(s): "INR", "PROTIME" in the last 168 hours. Cardiac Enzymes: No results for input(s): "CKTOTAL", "CKMB", "CKMBINDEX", "TROPONINI" in the last 168 hours. BNP (last 3 results) No results for input(s): "PROBNP" in the last 8760 hours. HbA1C: Recent Labs    06/21/23 1535  HGBA1C 5.7   CBG: Recent Labs  Lab 06/22/23 2317 06/23/23 0626  GLUCAP 70 99   Lipid Profile: No results for input(s): "CHOL", "HDL", "LDLCALC", "TRIG", "CHOLHDL", "LDLDIRECT" in the last 72 hours. Thyroid Function Tests: Recent Labs    06/23/23 0306  TSH 3.119   Anemia Panel: Recent Labs    06/21/23 1535  FOLATE >25.2  FERRITIN 134.2  TIBC 260.4  IRON 51   Sepsis Labs: Recent Labs  Lab 06/22/23 1553 06/22/23 1818  LATICACIDVEN 1.5 1.3    Recent Results (from the past 240 hours)  Culture, blood (routine x 2)     Status: None (Preliminary result)   Collection Time: 06/22/23  3:55 PM   Specimen: BLOOD  Result Value Ref Range Status   Specimen Description BLOOD BLOOD LEFT HAND  Final   Special Requests   Final    BOTTLES DRAWN AEROBIC AND ANAEROBIC Blood Culture results may not be optimal due to an inadequate volume of blood received in culture bottles   Culture   Final    NO GROWTH < 24 HOURS Performed at [facility], [address], [place], [place] [address]    Report Status PENDING  Incomplete  Culture,  blood (routine x 2)     Status: None (Preliminary result)   Collection Time: 06/22/23  4:00 PM   Specimen: BLOOD  Result Value Ref Range Status   Specimen Description BLOOD BLOOD RIGHT HAND  Final   Special Requests   Final    BOTTLES DRAWN AEROBIC AND ANAEROBIC Blood Culture results may not be optimal due to an inadequate volume of blood received in culture bottles   Culture   Final    NO GROWTH < 24 HOURS Performed at [facility], [address], [place], [place] [address]    Report Status PENDING  Incomplete  Resp panel by RT-PCR (RSV, Flu A&B, Covid) Anterior Nasal Swab     Status: None   Collection Time: 06/22/23  4:34 PM   Specimen: Anterior Nasal Swab  Result Value Ref Range Status   SARS Coronavirus 2 by RT PCR NEGATIVE NEGATIVE Final   Influenza A by PCR NEGATIVE NEGATIVE Final   Influenza B by PCR NEGATIVE NEGATIVE Final    Comment: (NOTE) The Xpert Xpress SARS-CoV-2/FLU/RSV  plus assay is intended as an aid in the diagnosis of influenza from Nasopharyngeal swab specimens and should not be used as a sole basis for treatment. Nasal washings and aspirates are unacceptable for Xpert Xpress SARS-CoV-2/FLU/RSV testing.  Fact Sheet for Patients: BloggerCourse.com  Fact Sheet for Healthcare Providers: SeriousBroker.it  This test is not yet approved or cleared by the [place] FDA and has been authorized for detection and/or diagnosis of SARS-CoV-2 by FDA under an Emergency Use Authorization (EUA). This EUA will remain in effect (meaning this test can be used) for the duration of the COVID-19 declaration under Section 564(b)(1) of the Act, 21 U.S.C. section 360bbb-3(b)(1), unless the authorization is terminated or revoked.     Resp Syncytial Virus by PCR NEGATIVE NEGATIVE Final    Comment: (NOTE) Fact Sheet for Patients: BloggerCourse.com  Fact Sheet for Healthcare  Providers: SeriousBroker.it  This test is not yet approved or cleared by the [place] FDA and has been authorized for detection and/or diagnosis of SARS-CoV-2 by FDA under an Emergency Use Authorization (EUA). This EUA will remain in effect (meaning this test can be used) for the duration of the COVID-19 declaration under Section 564(b)(1) of the Act, 21 U.S.C. section 360bbb-3(b)(1), unless the authorization is terminated or revoked.  Performed at [facility], [address] [place], [place] [address]          Radiology Studies: DG Chest Portable 1 View Result Date: 06/22/2023 CLINICAL DATA:  Sepsis EXAM: PORTABLE CHEST 1 VIEW COMPARISON:  X-ray 12/07/2022 FINDINGS: Borderline cardiopericardial silhouette. No consolidation, pneumothorax or effusion. No edema. Overlapping cardiac leads. Degenerative changes of the spine. IMPRESSION: Borderline cardiac enlargement.  No acute cardiopulmonary disease. Electronically Signed   By: Karen Kays M.D.   On: 06/22/2023 17:46        Scheduled Meds:  allopurinol  100 mg Oral Daily   apixaban  5 mg Oral BID   docusate sodium  100 mg Oral QODAY   finasteride  5 mg Oral Daily   flecainide  50 mg Oral Q12H   insulin aspart  0-5 Units Subcutaneous QHS   insulin aspart  0-6 Units Subcutaneous TID WC   LORazepam  0.5 mg Oral QHS   melatonin  5 mg Oral QHS   pantoprazole  40 mg Oral Daily   potassium chloride  40 mEq Oral Q4H   rOPINIRole  1.5 mg Oral QHS   sertraline  200 mg Oral QHS   [START ON 06/24/2023] torsemide  20 mg Oral Once   zinc sulfate (50mg  elemental zinc)  220 mg Oral QPM   Continuous Infusions:   ceFAZolin (ANCEF) IV 2 g (06/23/23 0549)          Glade Lloyd, MD [facility] 06/23/2023, 10:52 AM

## 2023-06-24 DIAGNOSIS — A419 Sepsis, unspecified organism: Secondary | ICD-10-CM | POA: Diagnosis not present

## 2023-06-24 LAB — CBC WITH DIFFERENTIAL/PLATELET
Abs Immature Granulocytes: 0.04 10*3/uL (ref 0.00–0.07)
Basophils Absolute: 0.1 10*3/uL (ref 0.0–0.1)
Basophils Relative: 1 %
Eosinophils Absolute: 0.2 10*3/uL (ref 0.0–0.5)
Eosinophils Relative: 2 %
HCT: 36.7 % — ABNORMAL LOW (ref 39.0–52.0)
Hemoglobin: 11.7 g/dL — ABNORMAL LOW (ref 13.0–17.0)
Immature Granulocytes: 0 %
Lymphocytes Relative: 24 %
Lymphs Abs: 2.2 10*3/uL (ref 0.7–4.0)
MCH: 30.7 pg (ref 26.0–34.0)
MCHC: 31.9 g/dL (ref 30.0–36.0)
MCV: 96.3 fL (ref 80.0–100.0)
Monocytes Absolute: 0.9 10*3/uL (ref 0.1–1.0)
Monocytes Relative: 10 %
Neutro Abs: 5.7 10*3/uL (ref 1.7–7.7)
Neutrophils Relative %: 63 %
Platelets: 187 10*3/uL (ref 150–400)
RBC: 3.81 MIL/uL — ABNORMAL LOW (ref 4.22–5.81)
RDW: 13.2 % (ref 11.5–15.5)
WBC: 9 10*3/uL (ref 4.0–10.5)
nRBC: 0 % (ref 0.0–0.2)

## 2023-06-24 LAB — BASIC METABOLIC PANEL
Anion gap: 12 (ref 5–15)
BUN: 21 mg/dL (ref 8–23)
CO2: 27 mmol/L (ref 22–32)
Calcium: 8.4 mg/dL — ABNORMAL LOW (ref 8.9–10.3)
Chloride: 103 mmol/L (ref 98–111)
Creatinine, Ser: 1.05 mg/dL (ref 0.61–1.24)
GFR, Estimated: >60 mL/min (ref >60)
Glucose, Bld: 85 mg/dL (ref 70–99)
Potassium: 3.5 mmol/L (ref 3.5–5.1)
Sodium: 142 mmol/L (ref 135–145)

## 2023-06-24 LAB — GLUCOSE, CAPILLARY
Glucose-Capillary: 87 mg/dL (ref 70–99)
Glucose-Capillary: 93 mg/dL (ref 70–99)
Glucose-Capillary: 139 mg/dL — ABNORMAL HIGH (ref 70–99)

## 2023-06-24 LAB — MAGNESIUM: Magnesium: 2.4 mg/dL (ref 1.7–2.4)

## 2023-06-24 MED ORDER — TRAMADOL HCL 50 MG PO TABS: 50.0000 mg | ORAL_TABLET | Freq: Two times a day (BID) | ORAL | Status: DC | PRN

## 2023-06-24 MED ORDER — MUPIROCIN CALCIUM 2 % EX CREA: TOPICAL_CREAM | Freq: Two times a day (BID) | CUTANEOUS | 1 refills | Status: DC

## 2023-06-24 MED ORDER — ATORVASTATIN CALCIUM 80 MG PO TABS
80.0000 mg | ORAL_TABLET | Freq: Every day | ORAL | Status: DC
  Administered 2023-06-24: 80 mg via ORAL
  Filled 2023-06-24: qty 1

## 2023-06-24 MED ORDER — CEPHALEXIN 500 MG PO CAPS: 500.0000 mg | ORAL_CAPSULE | Freq: Three times a day (TID) | ORAL | 0 refills | Status: DC

## 2023-06-24 NOTE — Evaluation (Signed)
 Occupational Therapy Evaluation Patient Details Name: [name] MRN: [id number] DOB: 03/17/1946 Today's Date: 06/24/2023   History of Present Illness   [name] is a 78 y.o. male admitted 06/22/23 for possible sepsis. He was advised to come by his PCP. Pt is afebrile with normal wbc and no clear source. Infection of decubiti is a possibility there was some reddness of the surrounding skin but not severe. Awaiting blood cultures. PMH significant for severe obstructive sleep apnea, restless leg syndrome, lymphedema, history of pulmonary hypertension and recurrent PE, atrial fibrillation on Eliquis, and obesity.     Clinical Impressions Pt reports using quad cane/rollator at basline for mobility in the home, scooter outside the home. Pt ind with ADLs, however has assist for donning compression stockings from spouse. Pt currently on 2LO2 (wears at baseline), able to complete ADLs with up to mod A and needs CGA for transfers with quad cane. Pt benefits from BUE support, often reaching for counter top/bed rails along with use of quad cane. SpO2 95% at end of session. Pt presenting with impairments listed below, will follow acutely. Recommend HHOT at d/c.      If plan is discharge home, recommend the following:   A little help with walking and/or transfers;A lot of help with bathing/dressing/bathroom;Assistance with cooking/housework;Assist for transportation;Help with stairs or ramp for entrance     Functional Status Assessment   Patient has had a recent decline in their functional status and demonstrates the ability to make significant improvements in function in a reasonable and predictable amount of time.     Equipment Recommendations   None recommended by OT     Recommendations for Other Services   PT consult     Precautions/Restrictions   Precautions Precautions: Fall Recall of Precautions/Restrictions: Intact Restrictions Weight Bearing Restrictions Per  Provider Order: No     Mobility Bed Mobility               General bed mobility comments: seated on EOB upon arrival and departure    Transfers Overall transfer level: Needs assistance Equipment used: Quad cane Transfers: Sit to/from Stand Sit to Stand: Contact guard assist                  Balance Overall balance assessment: Mild deficits observed, not formally tested                                         ADL either performed or assessed with clinical judgement   ADL Overall ADL's : Needs assistance/impaired Eating/Feeding: Supervision/ safety   Grooming: Supervision/safety;Sitting   Upper Body Bathing: Minimal assistance   Lower Body Bathing: Moderate assistance   Upper Body Dressing : Minimal assistance   Lower Body Dressing: Moderate assistance   Toilet Transfer: Contact guard Designer, fashion/clothing- Clothing Manipulation and Hygiene: Contact guard assist       Functional mobility during ADLs: Contact guard assist;Cane       Vision         Perception Perception: Not tested       Praxis Praxis: Not tested       Pertinent Vitals/Pain Pain Assessment Pain Assessment: No/denies pain     Extremity/Trunk Assessment Upper Extremity Assessment Upper Extremity Assessment: Generalized weakness   Lower Extremity Assessment Lower Extremity Assessment: Defer to PT evaluation   Cervical / Trunk Assessment Cervical / Trunk Assessment: Other  exceptions Cervical / Trunk Exceptions: Body Habitus   Communication Communication Communication: Impaired Factors Affecting Communication: Hearing impaired   Cognition Arousal: Alert Behavior During Therapy: WFL for tasks assessed/performed Cognition: No apparent impairments                               Following commands: Intact       Cueing  General Comments   Cueing Techniques: Verbal cues  SPO2 95% at end of session on 2L O2    Exercises     Shoulder Instructions      Home Living Family/patient expects to be discharged to:: Private residence Living Arrangements: Spouse/significant other Available Help at Discharge: Family;Available 24 hours/day Type of Home: House Home Access: Stairs to enter [employer] of Steps: 7 Entrance Stairs-Rails: Right;Left;Can reach both Home Layout: One level     Bathroom Shower/Tub: Chief Strategy Officer: Standard Bathroom Accessibility: Yes   Home Equipment: Rollator (4 wheels);Electric scooter;Shower seat;Other (comment);Grab bars - tub/shower;Grab bars - toilet;Hand held shower head;Cane - quad;Adaptive equipment (handicapped Zenaida Niece and supplemental O2) Adaptive Equipment: Sock aid Additional Comments: pt on 2L O2 baseline      Prior Functioning/Environment               Mobility Comments: ModI for mobility with cane or rollator for household ambulation; ModI with motorized scooter for community ambulation. 2 falls in the last 24mo. Pt reports sitting in a office chair for a prolonged time during the day. ADLs Comments: Indep with most ADLs. Requires assistance with putting on compression stockings and wound dressing changes. Wife takes care of household chores. Manages his own medications.    OT Problem List: Decreased range of motion;Decreased strength;Decreased activity tolerance;Impaired balance (sitting and/or standing);Decreased coordination;Cardiopulmonary status limiting activity   OT Treatment/Interventions: Self-care/ADL training;Therapeutic exercise;DME and/or AE instruction;Therapeutic activities;Patient/family education;Balance training      OT Goals(Current goals can be found in the care plan section)   Acute Rehab OT Goals Patient Stated Goal: did not state OT Goal Formulation: With patient Time For Goal Achievement: 07/08/23 Potential to Achieve Goals: Good ADL Goals Pt Will Perform Upper Body Dressing: with modified  independence;sitting Pt Will Perform Lower Body Dressing: with modified independence;sitting/lateral leans;sit to/from stand Pt Will Transfer to Toilet: with modified independence;ambulating;regular height toilet Pt Will Perform Tub/Shower Transfer: with modified independence;Shower transfer;Tub transfer;shower seat;ambulating;grab bars   OT Frequency:  Min 2X/week    Co-evaluation              AM-PAC OT "6 Clicks" Daily Activity     Outcome Measure Help from another person eating meals?: None Help from another person taking care of personal grooming?: A Little Help from another person toileting, which includes using toliet, bedpan, or urinal?: A Little Help from another person bathing (including washing, rinsing, drying)?: A Lot Help from another person to put on and taking off regular upper body clothing?: A Little Help from another person to put on and taking off regular lower body clothing?: A Lot 6 Click Score: 17   End of Session Equipment Utilized During Treatment: Gait belt;Oxygen Nurse Communication: Mobility status  Activity Tolerance: Patient tolerated treatment well Patient left: in bed;with call bell/phone within reach  OT Visit Diagnosis: Unsteadiness on feet (R26.81);Other abnormalities of gait and mobility (R26.89);Muscle weakness (generalized) (M62.81)                Time: 8119-1478 OT Time Calculation (min): 21  min Charges:  OT General Charges $OT Visit: 1 Visit OT Evaluation $OT Eval Moderate Complexity: 1 Mod  Ciena Sampley K, OTD, OTR/L SecureChat Preferred Acute Rehab [phone]   Desta Bujak K Koonce 06/24/2023, 10:59 AM

## 2023-06-24 NOTE — Progress Notes (Signed)
 [name] to be D/C'd home per MD order. Discussed with the patient and all questions fully answered.  Skin clean and dry, dressing to buttock wound and R posterior wound CDI. IV catheter discontinued intact. Site without signs and symptoms of complications. Dressing and pressure applied.  An After Visit Summary was printed and given to the patient.  Patient escorted via WC, and D/C home via private auto.  Jon Gills  06/24/2023

## 2023-06-24 NOTE — Plan of Care (Signed)
  Problem: Education: Goal: Knowledge of General Education information will improve Description: Including pain rating scale, medication(s)/side effects and non-pharmacologic comfort measures Outcome: Progressing   Problem: Health Behavior/Discharge Planning: Goal: Ability to manage health-related needs will improve Outcome: Progressing   Problem: Clinical Measurements: Goal: Ability to maintain clinical measurements within normal limits will improve Outcome: Progressing Goal: Will remain free from infection Outcome: Progressing Goal: Diagnostic test results will improve Outcome: Progressing Goal: Respiratory complications will improve Outcome: Progressing Goal: Cardiovascular complication will be avoided Outcome: Progressing   Problem: Activity: Goal: Risk for activity intolerance will decrease Outcome: Progressing   Problem: Nutrition: Goal: Adequate nutrition will be maintained Outcome: Progressing   Problem: Coping: Goal: Level of anxiety will decrease Outcome: Progressing   Problem: Elimination: Goal: Will not experience complications related to bowel motility Outcome: Progressing Goal: Will not experience complications related to urinary retention Outcome: Progressing   Problem: Pain Managment: Goal: General experience of comfort will improve and/or be controlled Outcome: Progressing   Problem: Safety: Goal: Ability to remain free from injury will improve Outcome: Progressing   Problem: Skin Integrity: Goal: Risk for impaired skin integrity will decrease Outcome: Progressing   Problem: Education: Goal: Ability to describe self-care measures that may prevent or decrease complications (Diabetes Survival Skills Education) will improve Outcome: Progressing Goal: Individualized Educational Video(s) Outcome: Progressing   Problem: Coping: Goal: Ability to adjust to condition or change in health will improve Outcome: Progressing   Problem: Fluid  Volume: Goal: Ability to maintain a balanced intake and output will improve Outcome: Progressing   Problem: Health Behavior/Discharge Planning: Goal: Ability to identify and utilize available resources and services will improve Outcome: Progressing Goal: Ability to manage health-related needs will improve Outcome: Progressing   Problem: Metabolic: Goal: Ability to maintain appropriate glucose levels will improve Outcome: Progressing   Problem: Nutritional: Goal: Maintenance of adequate nutrition will improve Outcome: Progressing Goal: Progress toward achieving an optimal weight will improve Outcome: Progressing   Problem: Skin Integrity: Goal: Risk for impaired skin integrity will decrease Outcome: Progressing   Problem: Tissue Perfusion: Goal: Adequacy of tissue perfusion will improve Outcome: Progressing   Problem: Clinical Measurements: Goal: Ability to avoid or minimize complications of infection will improve Outcome: Progressing   Problem: Skin Integrity: Goal: Skin integrity will improve Outcome: Progressing

## 2023-06-24 NOTE — Progress Notes (Signed)
   06/24/23 0020  BiPAP/CPAP/SIPAP  BiPAP/CPAP/SIPAP Pt Type Adult  BiPAP/CPAP/SIPAP Resmed  Mask Type Full face mask  Mask Size Medium  Respiratory Rate 16 breaths/min  IPAP 24 cmH20  EPAP 10 cmH2O  Flow Rate 2 lpm  Patient Home Equipment No  Auto Titrate No  BiPAP/CPAP /SiPAP Vitals  Resp 16

## 2023-06-24 NOTE — Discharge Summary (Addendum)
 Physician Discharge Summary  [name]:[id number] DOB: April 08, 1945 DOA: 06/22/2023  PCP: [name], MD  Admit date: 06/22/2023 Discharge date: 06/24/2023  Admitted From: Home Disposition: Home  Recommendations for Outpatient Follow-up:  Follow up with PCP in 1 week with repeat CBC/BMP Outpatient follow-up with cardiology Follow up in ED if symptoms worsen or new appear   Home Health: Home health PT Equipment/Devices: None  Discharge Condition: Stable CODE STATUS: DNR  diet recommendation: Heart healthy/fluid restriction of up to 1500 cc a day  Brief/Interim Summary: 78 y.o. male with medical history significant for severe obstructive sleep apnea, restless leg syndrome, lymphedema, history of pulmonary hypertension and recurrent PE, atrial fibrillation on Eliquis, obesity was seen at PCP office on 06/22/2023 and was referred to the ED because of low blood pressure and concern for infection of the sores on his back.  On presentation, blood pressure was on the lower side.  Labs were unremarkable.  He was started on IV antibiotics.  During the hospitalization, his condition has improved.  His blood pressure has much improved.  Cultures have remained negative so far.  He is not spiking temperatures.  He feels okay to go home today.  He will be discharged home on 5 more days of oral Keflex.   Discharge Diagnoses:   Hypotension History of hypertension -Questionable cause.  Patient currently on empiric antibiotics for concern for decubitus ulcer infection.  On exam, patient has some erythema of posterior right thigh.  Does not look grossly infected.  Continue mupirocin ointment. -Sepsis has been ruled out. -Antihypertensives on hold currently and metoprolol and losartan will remain on hold till reevaluation by PCP.  Torsemide has been resumed. -Patient feels much better, not spiking temperatures and feels okay to go home today.  Discharge patient home today on 5 more days of  oral Keflex.   Posterior right thigh stage II pressure injury with possible mild cellulitis: Present on admission Stage I mid buttock pressure injury: Present on admission--wound care consult still pending.  Topical antibiotic plan as above. -Recommend outpatient wound care consultation if problem persists   Chronic diastolic heart failure  PAH OSA Chronic respiratory failure with hypoxia Morbid obesity/obesity class III -Continue nightly BiPAP and supplemental nasal cannula oxygen during daytime -Currently compensated.  Continue with diet and fluid restriction.  Continue torsemide.  Outpatient follow-up with cardiology.  Plan for metoprolol and losartan as above.   Paroxysmal A-fib -Rate mostly controlled.  Continue flecainide, Eliquis.  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  Patient is recently mostly wheelchair-bound.     Discharge Instructions  Discharge Instructions     Ambulatory referral to Cardiology   Complete by: As directed    Diet - low sodium heart healthy   Complete by: As directed    Increase activity slowly   Complete by: As directed    No wound care   Complete by: As directed       Allergies as of 06/24/2023       Reactions   Morphine Nausea And Vomiting, Other (See Comments)   Severe hallucinations Other reaction(s): Hallucinations   Penicillins Other (See Comments)   Passed out after injection Did it involve swelling of the face/tongue/throat, SOB, or low  BP? Unknown Did it involve sudden or severe rash/hives, skin peeling, or any reaction on the inside of your mouth or nose? No Did you need to seek medical attention at a hospital or  doctor's office? Yes When did it last happen?    teenager   If all above answers are "NO", may proceed with cephalosporin use. Other reaction(s): Dizziness, Unknown   Codeine Nausea Only   Propoxyphene N-acetaminophen Nausea Only        Medication List     STOP taking these medications    losartan 25 MG tablet Commonly known as: COZAAR   metoprolol succinate 25 MG 24 hr tablet Commonly known as: TOPROL-XL       TAKE these medications    allopurinol 100 MG tablet Commonly known as: ZYLOPRIM TAKE 1 TABLET(100 MG) BY MOUTH DAILY   atorvastatin 80 MG tablet Commonly known as: LIPITOR TAKE 1 TABLET(80 MG) BY MOUTH DAILY   carboxymethylcellulose 0.5 % Soln Commonly known as: REFRESH PLUS Place 1 drop into both eyes 3 (three) times daily as needed (dry eyes).   cephALEXin 500 MG capsule Commonly known as: KEFLEX Take 1 capsule (500 mg total) by mouth 3 (three) times daily.   [employer] MELATONIN GUMMIES PO Take 5 mg by mouth at bedtime.   DSS 100 MG Caps Take 100 mg by mouth every other day.   Eliquis 5 MG Tabs tablet Generic drug: apixaban TAKE 1 TABLET(5 MG) BY MOUTH TWICE DAILY   finasteride 5 MG tablet Commonly known as: PROSCAR Take 1 tablet (5 mg total) by mouth daily.   flecainide 50 MG tablet Commonly known as: TAMBOCOR TAKE 1 TABLET(50 MG) BY MOUTH EVERY 12 HOURS   Jardiance 10 MG Tabs tablet Generic drug: empagliflozin TAKE 1 TABLET(10 MG) BY MOUTH DAILY BEFORE BREAKFAST   LORazepam 0.5 MG tablet Commonly known as: ATIVAN Take 1 tablet (0.5 mg total) by mouth at bedtime.   methocarbamol 500 MG tablet Commonly known as: ROBAXIN Take 1 tablet (500 mg total) by mouth 3 (three) times daily as needed for muscle spasms.   montelukast 10 MG tablet Commonly known as: SINGULAIR TAKE 1 TABLET(10 MG) BY MOUTH AT BEDTIME   multivitamin with minerals Tabs tablet Take 1 tablet by mouth daily.   mupirocin cream 2 % Commonly known as: BACTROBAN Apply  topically 2 (two) times daily. Apply to affected wounds   pantoprazole 40 MG tablet Commonly known as: PROTONIX TAKE 1 TABLET(40 MG) BY MOUTH DAILY   potassium chloride SA 20 MEQ tablet Commonly known as: KLOR-CON M TAKE 1 TABLET(20 MEQ) BY MOUTH TWICE DAILY What changed: See the new instructions.   rOPINIRole 0.5 MG tablet Commonly known as: REQUIP TAKE 1 TABLET(0.5 MG) BY MOUTH THREE TIMES DAILY   sertraline 100 MG tablet Commonly known as: ZOLOFT Take 2 tablets (200 mg total) by mouth at bedtime.   tirzepatide 7.5 MG/0.5ML Pen Commonly known as: MOUNJARO Inject 7.5 mg into the skin once a week.   torsemide 20 MG tablet Commonly known as: DEMADEX TAKE 3 TABLETS BY MOUTH EVERY DAY   traMADol 50 MG tablet Commonly known as: ULTRAM Take 1 tablet (50 mg total) by mouth 2 (two) times daily as needed for moderate pain (pain score 4-6). TAKE 1 TABLET BY MOUTH EVERY 8 HOURS AS NEEDED   zinc gluconate 50 MG tablet Take 1 tablet (50 mg total) by mouth daily. What changed: when to take this          Follow-up Information  [name], MD. Schedule an appointment as soon as possible for a visit in 1 week(s).   Specialty: Internal Medicine Contact information: [address] [place] [address] [phone]                Allergies  Allergen Reactions   Morphine Nausea And Vomiting and Other (See Comments)    Severe hallucinations Other reaction(s): Hallucinations   Penicillins Other (See Comments)    Passed out after injection Did it involve swelling of the face/tongue/throat, SOB, or low BP? Unknown Did it involve sudden or severe rash/hives, skin peeling, or any reaction on the inside of your mouth or nose? No Did you need to seek medical attention at a hospital or doctor's office? Yes When did it last happen?    teenager   If all above answers are "NO", may proceed with cephalosporin use. Other reaction(s): Dizziness, Unknown   Codeine  Nausea Only   Propoxyphene N-Acetaminophen Nausea Only    Consultations: None   Procedures/Studies: DG Chest Portable 1 View Result Date: 06/22/2023 CLINICAL DATA:  Sepsis EXAM: PORTABLE CHEST 1 VIEW COMPARISON:  X-ray 12/07/2022 FINDINGS: Borderline cardiopericardial silhouette. No consolidation, pneumothorax or effusion. No edema. Overlapping cardiac leads. Degenerative changes of the spine. IMPRESSION: Borderline cardiac enlargement.  No acute cardiopulmonary disease. Electronically Signed   By: Karen Kays M.D.   On: 06/22/2023 17:46      Subjective: Patient seen and examined at bedside.  Feels better enough to go home today.  Denies any fever, vomiting, chest pain or shortness of breath  Discharge Exam: Vitals:   06/24/23 0611 06/24/23 0816  BP:    Pulse: 60   Resp: 17   Temp:  98.3 F (36.8 C)  SpO2: 98%     General: Pt is alert, awake, not in acute distress.  Looks chronically ill and deconditioned.  On 2 L oxygen via nasal cannula. Cardiovascular: Mild remittent bradycardia present; S1/S2 + Respiratory: bilateral decreased breath sounds at bases with scattered crackles Abdominal: Soft, morbidly obese, NT, ND, bowel sounds + Extremities: No lower extremity edema; no cyanosis    The results of significant diagnostics from this hospitalization (including imaging, microbiology, ancillary and laboratory) are listed below for reference.     Microbiology: Recent Results (from the past 240 hours)  Culture, blood (routine x 2)     Status: None (Preliminary result)   Collection Time: 06/22/23  3:55 PM   Specimen: BLOOD LEFT HAND  Result Value Ref Range Status   Specimen Description BLOOD LEFT HAND  Final   Special Requests   Final    BOTTLES DRAWN AEROBIC AND ANAEROBIC Blood Culture results may not be optimal due to an inadequate volume of blood received in culture bottles   Culture   Final    NO GROWTH 2 DAYS Performed at [facility], [address]  [place], [place] [address]    Report Status PENDING  Incomplete  Culture, blood (routine x 2)     Status: None (Preliminary result)   Collection Time: 06/22/23  4:00 PM   Specimen: BLOOD RIGHT HAND  Result Value Ref Range Status   Specimen Description BLOOD RIGHT HAND  Final   Special Requests   Final    BOTTLES DRAWN AEROBIC AND ANAEROBIC Blood Culture results may not be optimal due to an inadequate volume of blood received in culture bottles   Culture   Final    NO GROWTH 2 DAYS Performed at [facility]  Lab, [address] [place], [place] [address]    Report Status PENDING  Incomplete  Resp panel by RT-PCR (RSV, Flu A&B, Covid) Anterior Nasal Swab     Status: None   Collection Time: 06/22/23  4:34 PM   Specimen: Anterior Nasal Swab  Result Value Ref Range Status   SARS Coronavirus 2 by RT PCR NEGATIVE NEGATIVE Final   Influenza A by PCR NEGATIVE NEGATIVE Final   Influenza B by PCR NEGATIVE NEGATIVE Final    Comment: (NOTE) The Xpert Xpress SARS-CoV-2/FLU/RSV plus assay is intended as an aid in the diagnosis of influenza from Nasopharyngeal swab specimens and should not be used as a sole basis for treatment. Nasal washings and aspirates are unacceptable for Xpert Xpress SARS-CoV-2/FLU/RSV testing.  Fact Sheet for Patients: BloggerCourse.com  Fact Sheet for Healthcare Providers: SeriousBroker.it  This test is not yet approved or cleared by the [place] FDA and has been authorized for detection and/or diagnosis of SARS-CoV-2 by FDA under an Emergency Use Authorization (EUA). This EUA will remain in effect (meaning this test can be used) for the duration of the COVID-19 declaration under Section 564(b)(1) of the Act, 21 U.S.C. section 360bbb-3(b)(1), unless the authorization is terminated or revoked.     Resp Syncytial Virus by PCR NEGATIVE NEGATIVE Final    Comment: (NOTE) Fact Sheet for  Patients: BloggerCourse.com  Fact Sheet for Healthcare Providers: SeriousBroker.it  This test is not yet approved or cleared by the [place] FDA and has been authorized for detection and/or diagnosis of SARS-CoV-2 by FDA under an Emergency Use Authorization (EUA). This EUA will remain in effect (meaning this test can be used) for the duration of the COVID-19 declaration under Section 564(b)(1) of the Act, 21 U.S.C. section 360bbb-3(b)(1), unless the authorization is terminated or revoked.  Performed at [facility], [address], [place], [place] [address]      Labs: BNP (last 3 results) Recent Labs    08/22/22 1643 12/02/22 0900  BNP 197.4* 771.9*   Basic Metabolic Panel: Recent Labs  Lab 06/21/23 1535 06/22/23 1529 06/23/23 0306 06/24/23 0314  NA 143 144 142 142  K 4.4 3.5 3.2* 3.5  CL 101 101 103 103  CO2 35* 31 30 27   GLUCOSE 127* 98 131* 85  BUN 35* 29* 28* 21  CREATININE 1.01 1.17 1.10 1.05  CALCIUM 8.6 8.4* 8.0* 8.4*  MG  --   --  2.3 2.4   Liver Function Tests: Recent Labs  Lab 06/21/23 1535 06/22/23 1529  AST 21 26  ALT 14 19  ALKPHOS 139* 130*  BILITOT 0.6 0.8  PROT 6.5 6.8  ALBUMIN 3.4* 3.2*   No results for input(s): "LIPASE", "AMYLASE" in the last 168 hours. No results for input(s): "AMMONIA" in the last 168 hours. CBC: Recent Labs  Lab 06/21/23 1535 06/22/23 1529 06/23/23 0306 06/24/23 0314  WBC 9.0 8.0 9.6 9.0  NEUTROABS 6.7  --   --  5.7  HGB 12.2* 12.7* 11.4* 11.7*  HCT 37.6* 40.1 35.6* 36.7*  MCV 95.8 99.0 96.7 96.3  PLT 210.0 209 196 187   Cardiac Enzymes: No results for input(s): "CKTOTAL", "CKMB", "CKMBINDEX", "TROPONINI" in the last 168 hours. BNP: Invalid input(s): "POCBNP" CBG: Recent Labs  Lab 06/23/23 1136 06/23/23 1633 06/23/23 2131 06/24/23 0609 06/24/23 0815  GLUCAP 101* 105* 87 87 93   D-Dimer No results for input(s): "DDIMER" in the last  72 hours. Hgb A1c Recent Labs    06/21/23 1535  HGBA1C 5.7   Lipid Profile No results for input(s): "CHOL", "HDL", "LDLCALC", "TRIG", "CHOLHDL", "LDLDIRECT" in the last 72 hours. Thyroid function studies Recent Labs    06/23/23 0306  TSH 3.119   Anemia work up Recent Labs    06/21/23 1535  FOLATE >25.2  FERRITIN 134.2  TIBC 260.4  IRON 51   Urinalysis    Component Value Date/Time   COLORURINE YELLOW 06/22/2023 1638   APPEARANCEUR CLEAR 06/22/2023 1638   LABSPEC 1.010 06/22/2023 1638   PHURINE 6.0 06/22/2023 1638   GLUCOSEU >=500 (A) 06/22/2023 1638   GLUCOSEU >=1000 (A) 06/21/2023 1535   HGBUR NEGATIVE 06/22/2023 1638   BILIRUBINUR NEGATIVE 06/22/2023 1638   KETONESUR NEGATIVE 06/22/2023 1638   PROTEINUR NEGATIVE 06/22/2023 1638   UROBILINOGEN 1.0 06/21/2023 1535   NITRITE NEGATIVE 06/22/2023 1638   LEUKOCYTESUR TRACE (A) 06/22/2023 1638   Sepsis Labs Recent Labs  Lab 06/21/23 1535 06/22/23 1529 06/23/23 0306 06/24/23 0314  WBC 9.0 8.0 9.6 9.0   Microbiology Recent Results (from the past 240 hours)  Culture, blood (routine x 2)     Status: None (Preliminary result)   Collection Time: 06/22/23  3:55 PM   Specimen: BLOOD LEFT HAND  Result Value Ref Range Status   Specimen Description BLOOD LEFT HAND  Final   Special Requests   Final    BOTTLES DRAWN AEROBIC AND ANAEROBIC Blood Culture results may not be optimal due to an inadequate volume of blood received in culture bottles   Culture   Final    NO GROWTH 2 DAYS Performed at [facility], [address] [place], [place] [address]    Report Status PENDING  Incomplete  Culture, blood (routine x 2)     Status: None (Preliminary result)   Collection Time: 06/22/23  4:00 PM   Specimen: BLOOD RIGHT HAND  Result Value Ref Range Status   Specimen Description BLOOD RIGHT HAND  Final   Special Requests   Final    BOTTLES DRAWN AEROBIC AND ANAEROBIC Blood Culture results may not be optimal due to an  inadequate volume of blood received in culture bottles   Culture   Final    NO GROWTH 2 DAYS Performed at [facility], [address], [place], [place] [address]    Report Status PENDING  Incomplete  Resp panel by RT-PCR (RSV, Flu A&B, Covid) Anterior Nasal Swab     Status: None   Collection Time: 06/22/23  4:34 PM   Specimen: Anterior Nasal Swab  Result Value Ref Range Status   SARS Coronavirus 2 by RT PCR NEGATIVE NEGATIVE Final   Influenza A by PCR NEGATIVE NEGATIVE Final   Influenza B by PCR NEGATIVE NEGATIVE Final    Comment: (NOTE) The Xpert Xpress SARS-CoV-2/FLU/RSV plus assay is intended as an aid in the diagnosis of influenza from Nasopharyngeal swab specimens and should not be used as a sole basis for treatment. Nasal washings and aspirates are unacceptable for Xpert Xpress SARS-CoV-2/FLU/RSV testing.  Fact Sheet for Patients: BloggerCourse.com  Fact Sheet for Healthcare Providers: SeriousBroker.it  This test is not yet approved or cleared by the [place] FDA and has been authorized for detection and/or diagnosis of SARS-CoV-2 by FDA under an Emergency Use Authorization (EUA). This EUA will remain in effect (meaning this test can be used) for the duration of the COVID-19 declaration under Section 564(b)(1) of the Act, 21 U.S.C. section 360bbb-3(b)(1), unless the authorization is terminated or revoked.  Resp Syncytial Virus by PCR NEGATIVE NEGATIVE Final    Comment: (NOTE) Fact Sheet for Patients: BloggerCourse.com  Fact Sheet for Healthcare Providers: SeriousBroker.it  This test is not yet approved or cleared by the [place] FDA and has been authorized for detection and/or diagnosis of SARS-CoV-2 by FDA under an Emergency Use Authorization (EUA). This EUA will remain in effect (meaning this test can be used) for the duration of the COVID-19  declaration under Section 564(b)(1) of the Act, 21 U.S.C. section 360bbb-3(b)(1), unless the authorization is terminated or revoked.  Performed at [facility], [address] [place], [place] [address]      Time coordinating discharge: 35 minutes  SIGNED:   Glade Lloyd, MD  [facility] 06/24/2023, 11:03 AM

## 2023-06-24 NOTE — Progress Notes (Signed)
   06/24/23 1127  Spiritual Encounters  Type of Visit Initial  Care provided to: Patient  Referral source Other (comment) (Spiritual Consult)  Reason for visit Routine spiritual support  OnCall Visit Yes  Spiritual Framework  Presenting Themes Meaning/purpose/sources of inspiration;Goals in life/care;Values and beliefs;Coping tools;Community and relationships  Values/beliefs Believes in the power of positive thinking, living life well  Strengths Empathetic, deep thinker  Needs/Challenges/Barriers None identified  Patient Stress Factors Health changes  Interventions  Spiritual Care Interventions Made Established relationship of care and support;Compassionate presence;Reflective listening;Normalization of emotions;Explored values/beliefs/practices/strengths;Encouragement  Intervention Outcomes  Outcomes Connection to spiritual care;Awareness around self/spiritual resourses;Connection to values and goals of care  Spiritual Care Plan  Spiritual Care Issues Still Outstanding No further spiritual care needs at this time (see row info)   Chaplain completed Spiritual Care consult for pt. No immediate needs related to the consult. Pt has been notified by care team that he is to be discharged today. Spouse of 55+ years coming to pick him up.    Pt spoke about belief in God, his attempt to live life well with "the right attitude", and to "feed" the positive moreso than the negative. No spiritual distress noted.

## 2023-06-25 ENCOUNTER — Telehealth: Payer: Self-pay | Admitting: *Deleted

## 2023-06-25 NOTE — Transitions of Care (Post Inpatient/ED Visit) (Signed)
 06/25/2023  Name: [name] MRN: [id number] DOB: [birth year]  Today's TOC FU Call Status: Today's TOC FU Call Status:: Successful TOC FU Call Completed TOC FU Call Complete Date: 06/25/23 Patient's Name and Date of Birth confirmed.  Transition Care Management Follow-up Telephone Call Date of Discharge: 06/24/23 Discharge Facility: [facility]) Type of Discharge: Inpatient Admission Primary Inpatient Discharge Diagnosis:: Sepsis How have you been since you were released from the hospital?: Better Any questions or concerns?: No  Items Reviewed: Did you receive and understand the discharge instructions provided?: Yes Medications obtained,verified, and reconciled?: Yes (Medications Reviewed) Any new allergies since your discharge?: No Dietary orders reviewed?: No Do you have support at home?: Yes People in Home: spouse Name of Support/Comfort Primary Source: [name]  Medications Reviewed Today: Medications Reviewed Today     Reviewed by Luella Cook, RN (Case Manager) on 06/25/23 at 1550  Med List Status: <None>   Medication Order Taking? Sig Documenting Provider Last Dose Status Informant  allopurinol (ZYLOPRIM) 100 MG tablet [id number] Yes TAKE 1 TABLET(100 MG) BY MOUTH DAILY [name], MD Taking Active Self  atorvastatin (LIPITOR) 80 MG tablet [id number] Yes TAKE 1 TABLET(80 MG) BY MOUTH DAILY [name], MD Taking Active Self  carboxymethylcellulose (REFRESH PLUS) 0.5 % SOLN [id number] Yes Place 1 drop into both eyes 3 (three) times daily as needed (dry eyes). [provider] Taking Active Self  cephALEXin (KEFLEX) 500 MG capsule [id number] Yes Take 1 capsule (500 mg total) by mouth 3 (three) times daily. Glade Lloyd, MD Taking Active   [facility] MELATONIN GUMMIES PO [id number] Yes Take 5 mg by mouth at bedtime. [provider] Taking Active Self  Docusate Sodium (DSS) 100 MG CAPS [id number] Yes Take 100 mg by mouth every other day. Jeralyn Bennett, MD Taking Active Self  ELIQUIS 5 MG TABS tablet [id number] Yes TAKE 1 TABLET(5 MG) BY MOUTH TWICE DAILY [name], MD Taking Active Self  empagliflozin (JARDIANCE) 10 MG TABS tablet [id number] Yes TAKE 1 TABLET(10 MG) BY MOUTH DAILY BEFORE BREAKFAST [name], Lisette Abu, MD Taking Active Self  finasteride (PROSCAR) 5 MG tablet [id number] Yes Take 1 tablet (5 mg total) by mouth daily. Charlton Amor, PA-C Taking Active Self  flecainide (TAMBOCOR) 50 MG tablet [id number] Yes TAKE 1 TABLET(50 MG) BY MOUTH EVERY 12 HOURS [name], Lisette Abu, MD Taking Active Self  LORazepam (ATIVAN) 0.5 MG tablet [id number] Yes Take 1 tablet (0.5 mg total) by mouth at bedtime. Cleotis Nipper, MD Taking Active Self  methocarbamol (ROBAXIN) 500 MG tablet [id number] Yes Take 1 tablet (500 mg total) by mouth 3 (three) times daily as needed for muscle spasms. Jones Bales, NP Taking Active Self           Med Note (SATTERFIELD, DARIUS E   Fri Jun 22, 2023  7:46 PM) Still has on hand  montelukast (SINGULAIR) 10 MG tablet [id number] Yes TAKE 1 TABLET(10 MG) BY MOUTH AT BEDTIME [name], MD Taking Active Self  Multiple Vitamin (MULTIVITAMIN WITH MINERALS) TABS tablet [id number] Yes Take 1 tablet by mouth daily. [provider] Taking Active Self  mupirocin cream (BACTROBAN) 2 % [id number] Yes Apply topically 2 (two) times daily. Apply to affected wounds Glade Lloyd, MD Taking Active   pantoprazole (PROTONIX) 40 MG tablet [id number] Yes TAKE 1 TABLET(40 MG) BY MOUTH DAILY [name], MD Taking Active Self  potassium chloride SA (KLOR-CON M) 20 MEQ tablet [id number] Yes TAKE 1 TABLET(20  MEQ) BY MOUTH TWICE DAILY  Patient taking differently: Take 40 mEq by mouth every evening.   Chrystie Nose, MD Taking Active Self  rOPINIRole (REQUIP) 0.5 MG tablet [id number] Yes TAKE 1 TABLET(0.5 MG) BY MOUTH THREE TIMES DAILY Young, Clinton D, MD Taking Active Self  sertraline (ZOLOFT) 100 MG tablet [id number] Yes  Take 2 tablets (200 mg total) by mouth at bedtime. Cleotis Nipper, MD Taking Active Self  tirzepatide [facility]) 7.5 MG/0.5ML Pen [id number] Yes Inject 7.5 mg into the skin once a week. [name], MD Taking Active Self           Med Note (SATTERFIELD, Genoveva Ill   Fri Jun 22, 2023  7:46 PM) Take on Fridays  torsemide (DEMADEX) 20 MG tablet [id number] Yes TAKE 3 TABLETS BY MOUTH EVERY DAY [name], Lisette Abu, MD Taking Active Self  traMADol (ULTRAM) 50 MG tablet [id number] Yes Take 1 tablet (50 mg total) by mouth 2 (two) times daily as needed for moderate pain (pain score 4-6). TAKE 1 TABLET BY MOUTH EVERY 8 HOURS AS NEEDED Glade Lloyd, MD Taking Active   zinc gluconate 50 MG tablet [id number] Yes Take 1 tablet (50 mg total) by mouth daily.  Patient taking differently: Take 50 mg by mouth every evening.   [name], MD Taking Active Self            Home Care and Equipment/Supplies: Were Home Health Services Ordered?: NA Any new equipment or medical supplies ordered?: NA  Functional Questionnaire: Do you need assistance with bathing/showering or dressing?: No Do you need assistance with meal preparation?: Yes Do you need assistance with eating?: No Do you have difficulty maintaining continence: No Do you need assistance with getting out of bed/getting out of a chair/moving?: No Do you have difficulty managing or taking your medications?: No  Follow up appointments reviewed: PCP Follow-up appointment confirmed?: No MD Provider Line Number:[phone] Given: Yes (Patient has put in call to the Dr office for an appt and is waiting for call back for appt) Specialist Hospital Follow-up appointment confirmed?: Yes Date of Specialist follow-up appointment?: 06/29/23 Follow-Up Specialty Provider:: Clarisa Kindred Do you need transportation to your follow-up appointment?: No Do you understand care options if your condition(s) worsen?: Yes-patient verbalized understanding  SDOH  Interventions Today    Flowsheet Row Most Recent Value  SDOH Interventions   Food Insecurity Interventions Intervention Not Indicated  Housing Interventions Intervention Not Indicated  Transportation Interventions Intervention Not Indicated  Utilities Interventions Intervention Not Indicated      Interventions Today    Flowsheet Row Most Recent Value  Chronic Disease   Chronic disease during today's visit Other  [Sepsis]  General Interventions   General Interventions Discussed/Reviewed General Interventions Discussed, General Interventions Reviewed, Doctor Visits  Doctor Visits Discussed/Reviewed Doctor Visits Discussed, Doctor Visits Reviewed, PCP, Specialist  PCP/Specialist Visits Compliance with follow-up visit  Pharmacy Interventions   Pharmacy Dicussed/Reviewed Pharmacy Topics Discussed, Pharmacy Topics Reviewed        Gean Maidens BSN RN [facility] Health Care Management Coordinator [name] Direct Dial: [phone]  Fax: [phone] Website: [email or web address]

## 2023-06-26 LAB — RETICULOCYTES
ABS Retic: 39100 {cells}/uL (ref 25000–90000)
Retic Ct Pct: 1 %

## 2023-06-26 LAB — VITAMIN B1: Vitamin B1 (Thiamine): 33 nmol/L — ABNORMAL HIGH (ref 8–30)

## 2023-06-26 LAB — ZINC: Zinc: 70 ug/dL (ref 60–130)

## 2023-06-27 LAB — CULTURE, BLOOD (ROUTINE X 2)
Culture: NO GROWTH
Culture: NO GROWTH

## 2023-07-02 ENCOUNTER — Other Ambulatory Visit: Payer: Self-pay | Admitting: Internal Medicine

## 2023-07-10 ENCOUNTER — Ambulatory Visit: Admitting: Cardiology

## 2023-07-16 ENCOUNTER — Other Ambulatory Visit: Payer: Self-pay | Admitting: Internal Medicine

## 2023-07-16 DIAGNOSIS — M1A09X0 Idiopathic chronic gout, multiple sites, without tophus (tophi): Secondary | ICD-10-CM

## 2023-07-19 NOTE — Progress Notes (Signed)
 Cardiology Office Note    Date:  07/21/2023  ID:  [name], DOB 1945/06/28, MRN [id number] PCP:  [name], MD  Cardiologist:  Vinie JAYSON Maxcy, MD  Electrophysiologist:  None   Chief Complaint: Follow up for afib   History of Present Illness: .    [name] is a 78 y.o. male with visit-pertinent history of severe OSA, restless leg syndrome, lymphedema, history of pulmonary hypertension and recurrent PE, atrial fibrillation on Eliquis , obesity.  Patient with prior admissions in 2015 with multiple medical problems including pulmonary embolus, moderate to large pericardial effusion, new onset A-fib and GI bleeding.  Patient underwent pericardial window on 07/18/2013 with hemorrhagic fluid drained.  Gram stain and cultures were negative.  Outpatient stress test in 08/2013 showed normal myocardial perfusion and normal LVEF of 58%.  He has maintained consistent follow-up with persistent shortness of breath.  Patient was seen in 04/2021 with Dr. Maxcy.  He continued to report dyspnea on exertion with significant desaturations with exertional oxygen .  Dr. Maxcy and Dr. Neysa, pulmonologist mutually agreed upon repeat echo with bubble study which revealed normal biventricular function, unable to assess diastolic function, negative bubble study for shunting.  He underwent right and left heart cath on 05/12/2021 which revealed very mild pulmonary hypertension at rest with high cardiac output.  Severe pulmonary hypertension with exertional desaturations at low level exertion due to marked elevations in PCWP.  No evidence of intracardiac shunting.  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  Study consistent with severe diastolic dysfunction with development of severe pulmonary venous hypertension with low-level exercise, recommendation for BP control, SGLT2 inhibitor and weight loss.  Patient was last seen in clinic by Dr. Maxcy on 08/22/2022.  He  continued to note problems with dyspnea.  Study reviewed with patient it was noted that patient had a pericardial window performed for pericarditis and had significant pericardial effusion back in 2015, reported that time showed a pericardial biopsy which was negative for tumor and culture negative but was bloody with significant pericarditis suggesting a possible viral etiology.  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  Patient admitted in 12/2022 with sepsis due to bilateral lower extremity soft tissue infection superimposed on bilateral lower extremity lymphedema,   Patient was admitted from 3/21-3/23/25 after being seen at his PCPs office for low blood pressure and concern for infection of the sores on his back.  Patient was started on IV antibiotics, he was ruled out for sepsis.  He was discharged on 5 days of oral Keflex , antihypertensives were held and torsemide  was resumed.  Today patient presents for follow-up.  He reports that he is doing well. He has been working to lose weight, is now on monjaro.  Patient reports that since he was discharged from the hospital this is the best he has felt in months.  He reports that his breathing is at baseline, denies any chest pain.  He reports that his lower extremity edema is actually improved from his baseline.  He notes that activity remains very difficult given his pulmonary hypertension, he reports that his pulmonologist has referred him to a specialist that he should be seeing in the near future.  Labwork independently reviewed: 06/24/2023: Sodium 142, potassium 3.5, creatinine 1.05, magnesium 2.4, hemoglobin 1.7, hematocrit 36.7 ROS: .   Today he denies chest pain,fatigue, palpitations, melena, hematuria, hemoptysis, diaphoresis, presyncope, syncope, orthopnea, and  PND.  All other systems are reviewed and otherwise negative. Studies Reviewed: SABRA    EKG:  EKG on 06/22/23  indicates sinus bradycardia at 56 bpm with right bundle branch block, Qtc 494.  CV Studies: Cardiac studies reviewed are outlined and summarized above. Otherwise please see EMR for full report. Cardiac Studies & Procedures   ______________________________________________________________________________________________ CARDIAC CATHETERIZATION  CARDIAC CATHETERIZATION 05/12/2021  Conclusion Findings:  Resting numbers on 2L O2  Ao = 127/60 (85) LV = 114/15 RA =  11 RV = 46/16 PA = 45/18 (32) PCW = 18 Fick cardiac output/index = 9.2/3.3 PVR = 1.6 WU ABG =  7.4/52/83/96% PA sat = 69%, 70% SVC sat = 69%  Unloaded cycling  PA = 74/39 (43) PCWP = 40 Thermo CO/CI = 12.5/4.5 Sat 95%  10W cycling  PA = 84/37 (52) PCWP = 44 (v waves not prominent) Thermo CO/CI = 11.5/4.1  Sat 81% ABG at peak 7.30/67/55/84%   Assessment: 1. Very mild pulmonary HTN at rest with high cardiac output 2. 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 No evidence of L to T intracardiac shunting 4. 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  Recommend: BP control, trial of SGLT2i and weight loss.  [name] Fuel, MD 12:01 PM   STRESS TESTS  MYOCARDIAL PERFUSION IMAGING 05/17/2016  Narrative  The left ventricular ejection fraction is normal (55-65%).  Nuclear stress EF: 55%.  There was no ST segment deviation noted during stress.  No T wave inversion was noted during stress.  The study is normal.  This is a low risk study.   ECHOCARDIOGRAM  ECHOCARDIOGRAM COMPLETE 12/03/2022  Narrative ECHOCARDIOGRAM REPORT    Patient Name:   [name] [facility] Date of Exam: 12/03/2022 Medical Rec #:  [id number]         Height:       72.0 in Accession  #:    [id number]        Weight:       381.6 lb Date of Birth:  [birth year]         BSA:          2.804 m Patient Age:    76 years          BP:           115/97 mmHg Patient Gender: M                 HR:           62 bpm. Exam Location:  Inpatient  Procedure: 2D Echo, Cardiac Doppler, Color Doppler and Intracardiac Opacification Agent  Indications:    CHF-Acute Diastolic I50.31  History:        Patient has prior history of Echocardiogram examinations, most recent 05/17/2021. CHF, Stroke, Arrythmias:Atrial Fibrillation; Risk Factors:Diabetes, Hypertension and Dyslipidemia. Morbid Obesity.  Sonographer:    Aida Pizza RCS Referring Phys: [phone] WHITNEY D HARRIS  IMPRESSIONS   1. Left ventricular ejection fraction, by estimation, is 60 to 65%. The left ventricle has normal function. The left ventricle has no regional wall motion abnormalities. There is mild concentric left ventricular hypertrophy. Left ventricular diastolic parameters are indeterminate. 2. Right ventricular systolic function is mildly reduced. The right ventricular size is moderately enlarged. There is mildly elevated pulmonary artery systolic pressure. The estimated right  ventricular systolic pressure is 39.4 mmHg. 3. Left atrial size was moderately dilated. 4. Right atrial size was moderately dilated. 5. The mitral valve is normal in structure. No evidence of mitral valve regurgitation. No evidence of mitral stenosis. 6. The aortic valve is tricuspid. There is mild calcification of the aortic valve. Aortic valve regurgitation is not visualized. Aortic valve sclerosis/calcification is present, without any evidence of aortic stenosis. 7. Aortic dilatation noted. There is mild dilatation of the aortic root, measuring 41 mm. 8. The inferior vena cava is dilated in size with <50% respiratory variability, suggesting right atrial pressure of 15 mmHg.  FINDINGS Left Ventricle: Left ventricular ejection fraction, by estimation,  is 60 to 65%. The left ventricle has normal function. The left ventricle has no regional wall motion abnormalities. Definity  contrast agent was given IV to delineate the left ventricular endocardial borders. The left ventricular internal cavity size was normal in size. There is mild concentric left ventricular hypertrophy. Left ventricular diastolic parameters are indeterminate.  Right Ventricle: The right ventricular size is moderately enlarged. No increase in right ventricular wall thickness. Right ventricular systolic function is mildly reduced. There is mildly elevated pulmonary artery systolic pressure. The tricuspid regurgitant velocity is 2.47 m/s, and with an assumed right atrial pressure of 15 mmHg, the estimated right ventricular systolic pressure is 39.4 mmHg.  Left Atrium: Left atrial size was moderately dilated.  Right Atrium: Right atrial size was moderately dilated.  Pericardium: There is no evidence of pericardial effusion.  Mitral Valve: The mitral valve is normal in structure. No evidence of mitral valve regurgitation. No evidence of mitral valve stenosis. MV peak gradient, 8.1 mmHg. The mean mitral valve gradient is 2.0 mmHg.  Tricuspid Valve: The tricuspid valve is normal in structure. Tricuspid valve regurgitation is mild . No evidence of tricuspid stenosis.  Aortic Valve: The aortic valve is tricuspid. There is mild calcification of the aortic valve. Aortic valve regurgitation is not visualized. Aortic valve sclerosis/calcification is present, without any evidence of aortic stenosis. Aortic valve mean gradient measures 8.0 mmHg. Aortic valve peak gradient measures 14.1 mmHg. Aortic valve area, by VTI measures 2.49 cm.  Pulmonic Valve: The pulmonic valve was not well visualized. Pulmonic valve regurgitation is not visualized. No evidence of pulmonic stenosis.  Aorta: Aortic dilatation noted. There is mild dilatation of the aortic root, measuring 41 mm.  Venous: The  inferior vena cava is dilated in size with less than 50% respiratory variability, suggesting right atrial pressure of 15 mmHg.  IAS/Shunts: No atrial level shunt detected by color flow Doppler.   LEFT VENTRICLE PLAX 2D LVIDd:         5.50 cm   Diastology LVIDs:         2.70 cm   LV e' medial:    8.49 cm/s LV PW:         1.10 cm   LV E/e' medial:  15.9 LV IVS:        1.20 cm   LV e' lateral:   13.90 cm/s LVOT diam:     2.10 cm   LV E/e' lateral: 9.7 LV SV:         114 LV SV Index:   41 LVOT Area:     3.46 cm   RIGHT VENTRICLE RV S prime:     14.60 cm/s TAPSE (M-mode): 2.2 cm  LEFT ATRIUM              Index        RIGHT  ATRIUM           Index LA diam:        5.40 cm  1.93 cm/m   RA Area:     20.90 cm LA Vol (A2C):   112.0 ml 39.95 ml/m  RA Volume:   63.20 ml  22.54 ml/m LA Vol (A4C):   126.0 ml 44.94 ml/m LA Biplane Vol: 120.0 ml 42.80 ml/m AORTIC VALVE AV Area (Vmax):    2.76 cm AV Area (Vmean):   2.64 cm AV Area (VTI):     2.49 cm AV Vmax:           188.00 cm/s AV Vmean:          134.000 cm/s AV VTI:            0.457 m AV Peak Grad:      14.1 mmHg AV Mean Grad:      8.0 mmHg LVOT Vmax:         150.00 cm/s LVOT Vmean:        102.000 cm/s LVOT VTI:          0.328 m LVOT/AV VTI ratio: 0.72  AORTA Ao Root diam: 4.10 cm  MITRAL VALVE                TRICUSPID VALVE MV Area (PHT): 2.83 cm     TR Peak grad:   24.4 mmHg MV Area VTI:   2.24 cm     TR Vmax:        247.00 cm/s MV Peak grad:  8.1 mmHg MV Mean grad:  2.0 mmHg     SHUNTS MV Vmax:       1.42 m/s     Systemic VTI:  0.33 m MV Vmean:      56.5 cm/s    Systemic Diam: 2.10 cm MV Decel Time: 268 msec MV E velocity: 135.00 cm/s MV A velocity: 72.20 cm/s MV E/A ratio:  1.87  [name] Fuel MD Electronically signed by [name] Fuel MD Signature Date/Time: 12/03/2022/10:18:53 PM    Final          ______________________________________________________________________________________________        Current Reported Medications:.    Current Meds  Medication Sig   allopurinol  (ZYLOPRIM ) 100 MG tablet TAKE 1 TABLET(100 MG) BY MOUTH DAILY   atorvastatin  (LIPITOR ) 80 MG tablet TAKE 1 TABLET(80 MG) BY MOUTH DAILY   carboxymethylcellulose (REFRESH PLUS) 0.5 % SOLN Place 1 drop into both eyes 3 (three) times daily as needed (dry eyes).   [employer] MELATONIN GUMMIES PO Take 5 mg by mouth at bedtime.   Docusate Sodium  (DSS) 100 MG CAPS Take 100 mg by mouth every other day.   ELIQUIS  5 MG TABS tablet TAKE 1 TABLET(5 MG) BY MOUTH TWICE DAILY   empagliflozin  (JARDIANCE ) 10 MG TABS tablet TAKE 1 TABLET(10 MG) BY MOUTH DAILY BEFORE BREAKFAST   finasteride  (PROSCAR ) 5 MG tablet Take 1 tablet (5 mg total) by mouth daily.   flecainide  (TAMBOCOR ) 50 MG tablet TAKE 1 TABLET(50 MG) BY MOUTH EVERY 12 HOURS   LORazepam  (ATIVAN ) 0.5 MG tablet Take 1 tablet (0.5 mg total) by mouth at bedtime.   metoprolol  succinate (TOPROL  XL) 25 MG 24 hr tablet Take 0.5 tablets (12.5 mg total) by mouth daily.   montelukast  (SINGULAIR ) 10 MG tablet TAKE 1 TABLET(10 MG) BY MOUTH AT BEDTIME   Multiple Vitamin (MULTIVITAMIN WITH MINERALS) TABS tablet Take 1 tablet by mouth daily.   mupirocin  cream (BACTROBAN ) 2 % Apply  topically 2 (two) times daily. Apply to affected wounds   pantoprazole  (PROTONIX ) 40 MG tablet TAKE 1 TABLET(40 MG) BY MOUTH DAILY   potassium chloride  SA (KLOR-CON  M) 20 MEQ tablet TAKE 1 TABLET(20 MEQ) BY MOUTH TWICE DAILY   rOPINIRole  (REQUIP ) 0.5 MG tablet TAKE 1 TABLET(0.5 MG) BY MOUTH THREE TIMES DAILY   sertraline  (ZOLOFT ) 100 MG tablet Take 2 tablets (200 mg total) by mouth at bedtime.   tirzepatide  (MOUNJARO ) 7.5 MG/0.5ML Pen Inject 7.5 mg into the skin once a week.   torsemide  (DEMADEX ) 20 MG tablet TAKE 3 TABLETS BY MOUTH EVERY DAY   traMADol  (ULTRAM ) 50 MG tablet Take 1 tablet (50 mg total) by mouth 2 (two) times daily as needed for moderate pain (pain score 4-6). TAKE 1 TABLET BY MOUTH EVERY 8 HOURS AS  NEEDED   zinc  gluconate 50 MG tablet Take 1 tablet (50 mg total) by mouth daily. (Patient taking differently: Take 50 mg by mouth every evening.)    Physical Exam:    VS:  BP 124/82   Pulse 76   Ht 6' (1.829 m)   Wt (!) 325 lb 12.8 oz (147.8 kg)   SpO2 98%   BMI 44.19 kg/m    Wt Readings from Last 3 Encounters:  07/20/23 (!) 325 lb 12.8 oz (147.8 kg)  06/24/23 (!) 329 lb 12.9 oz (149.6 kg)  06/05/23 (!) 329 lb (149.2 kg)    GEN: Well nourished, well developed in no acute distress NECK: No JVD; No carotid bruits CARDIAC: RRR, no murmurs, rubs, gallops RESPIRATORY:  Clear to auscultation without rales, wheezing or rhonchi  ABDOMEN: Soft, non-tender, non-distended EXTREMITIES:  No edema; No acute deformity     Asessement and Plan:.    Paroxysmal atrial fibrillation: On flecainide , EKG on 06/22/23 indicates sinus bradycardia at 56 bpm with right bundle branch block, Qtc 494. Continue Eliquis  5 mg twice daily, patient denies any bleeding concerns.  Patient denies any palpitations or tachycardia.  Continue flecainide  and Eliquis .  Patient's metoprolol  discontinued during recent hospitalization for low blood pressures, will restart metoprolol  succinate 12.5 mg daily.  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  Chronic dyspnea on exertion and chronic bilateral lower extremity edema. Patient previously had pericarditis that was presumed viral in 2015 and underwent pericardial window for pericardial effusion.  Dr. Mona questioned if patient had developed constrictive pericarditis, it was recommended that he undergo cardiac MRI not yet completed, patient agreeable to undergo MRI, will reorder.  Today he appears euvolemic and well compensated on exam.  Check CBC and BMET.   Pulmonary HTN: No shunting on echo previously with bubble study.  Right heart cath in 05/2021 revealed significant exertional desaturations at low  level exertion due to marked elevations in PCWP.  On chronic O2. Patient notes that weights are stable, he is intentionally losing weight, has lost 35-40 lbs since November, since starting on Mounjaro . Continue to follow with pulmonology.  Obesity: Patient has lost 35 to 40 pounds since November after starting on Mounjaro .  Patient notes that he overall feels significantly better, continues to note shortness of breath related to pulmonary hypertension and remains on oxygen .  He reports that his lymphedema has significantly improved.  Hypertension: Blood pressure today 124/82.  Continue current antihypertensive regimen.  Ascending aorta dilation: CT angio on 12/02/2022 indicated ascending aortic dilation up to 4.3 cm, recommended annual imaging followed by CTA or MRA.   OSA: Patient  reports BiPAP compliance.    Disposition: F/u with Dr. Mona in 3-4 months   Signed, Arch Methot D Kanoelani Dobies, NP

## 2023-07-20 ENCOUNTER — Ambulatory Visit: Attending: Cardiology | Admitting: Cardiology

## 2023-07-20 ENCOUNTER — Encounter: Payer: Self-pay | Admitting: Cardiology

## 2023-07-20 VITALS — BP 124/82 | HR 76 | Ht 72.0 in | Wt 325.8 lb

## 2023-07-20 DIAGNOSIS — I272 Pulmonary hypertension, unspecified: Secondary | ICD-10-CM | POA: Insufficient documentation

## 2023-07-20 DIAGNOSIS — I5032 Chronic diastolic (congestive) heart failure: Secondary | ICD-10-CM | POA: Insufficient documentation

## 2023-07-20 DIAGNOSIS — R600 Localized edema: Secondary | ICD-10-CM | POA: Diagnosis not present

## 2023-07-20 DIAGNOSIS — R0609 Other forms of dyspnea: Secondary | ICD-10-CM | POA: Insufficient documentation

## 2023-07-20 DIAGNOSIS — I480 Paroxysmal atrial fibrillation: Secondary | ICD-10-CM | POA: Insufficient documentation

## 2023-07-20 DIAGNOSIS — Z79899 Other long term (current) drug therapy: Secondary | ICD-10-CM | POA: Diagnosis not present

## 2023-07-20 DIAGNOSIS — Z7901 Long term (current) use of anticoagulants: Secondary | ICD-10-CM | POA: Diagnosis present

## 2023-07-20 DIAGNOSIS — I10 Essential (primary) hypertension: Secondary | ICD-10-CM | POA: Diagnosis not present

## 2023-07-20 MED ORDER — METOPROLOL SUCCINATE ER 25 MG PO TB24: 12.5000 mg | ORAL_TABLET | Freq: Every day | ORAL | 3 refills | Status: AC

## 2023-07-20 NOTE — Patient Instructions (Addendum)
 Medication Instructions:  Restart Metoprolol  Succinate ER 12.5 mg (1/2 tablet) once a day  *If you need a refill on your cardiac medications before your next appointment, please call your pharmacy*  Lab Work: Today we are going to draw a CBC and Bmet If you have labs (blood work) drawn today and your tests are completely normal, you will receive your results only by: MyChart Message (if you have MyChart) OR A paper copy in the mail If you have any lab test that is abnormal or we need to change your treatment, we will call you to review the results.  Testing/Procedures:  [facility] [address] [place], [place] [address] Please take advantage of the free valet parking available at the [facility] and [employer] (Entrance C).  Proceed to the [facility] Radiology Department (First Floor) for check-in.  Magnetic resonance imaging (MRI) is a painless test that produces images of the inside of the body without using Xrays.  During an MRI, strong magnets and radio waves work together in a Data processing manager to form detailed images.   MRI images may provide more details about a medical condition than X-rays, CT scans, and ultrasounds can provide.  You may be given earphones to listen for instructions.  You may eat a light breakfast and take medications as ordered with the exception of furosemide , hydrochlorothiazide, chlorthalidone or spironolactone (or any other fluid pill). If you are undergoing a stress MRI, please avoid stimulants for 12 hr prior to test. (I.e. Caffeine, nicotine, chocolate, or antihistamine medications)  If your provider has ordered anti-anxiety medications for this test, then you will need a driver.  An IV will be inserted into one of your veins. Contrast material will be injected into your IV. It will leave your body through your urine within a day. You may be told to drink plenty of fluids to help flush the contrast material out of your system.  You  will be asked to remove all metal, including: Watch, jewelry, and other metal objects including hearing aids, hair pieces and dentures. Also wearable glucose monitoring systems (ie. Freestyle Libre and Omnipods) (Braces and fillings normally are not a problem.)   TEST WILL TAKE APPROXIMATELY 1 HOUR  PLEASE NOTIFY SCHEDULING AT LEAST 24 HOURS IN ADVANCE IF YOU ARE UNABLE TO KEEP YOUR APPOINTMENT. [phone]  For more information and frequently asked questions, please visit our website : [email or web address]  Please call the Cardiac Imaging Nurse Navigators with any questions/concerns. [phone] Office   Follow-Up: At [facility], you and your health needs are our priority.  As part of our continuing mission to provide you with exceptional heart care, our providers are all part of one team.  This team includes your primary Cardiologist (physician) and Advanced Practice Providers or APPs (Physician Assistants and Nurse Practitioners) who all work together to provide you with the care you need, when you need it.  Your next appointment:   3-4 month(s)   Provider:   Hazle Lites, MD    We recommend signing up for the patient portal called "MyChart".  Sign up information is provided on this After Visit Summary.  MyChart is used to connect with patients for Virtual Visits (Telemedicine).  Patients are able to view lab/test results, encounter notes, upcoming appointments, etc.  Non-urgent messages can be sent to your provider as well.   To learn more about what you can do with MyChart, go to [email or web address].   Other Instructions:   1st Floor: - Lobby -  Registration  - Pharmacy  - Lab - Cafe  2nd Floor: - PV Lab - Diagnostic Testing (echo, CT, nuclear med)  3rd Floor: - Vacant  4th Floor: - TCTS (cardiothoracic surgery) - AFib Clinic - Structural Heart Clinic - Vascular Surgery  - Vascular Ultrasound  5th Floor: - [facility]  (general and EP) - Clinical Pharmacy for coumadin, hypertension, lipid, weight-loss medications, and med management appointments    Valet parking services will be available as well.

## 2023-07-21 ENCOUNTER — Encounter: Payer: Self-pay | Admitting: Cardiology

## 2023-07-21 LAB — BASIC METABOLIC PANEL WITH GFR
BUN/Creatinine Ratio: 29 — ABNORMAL HIGH (ref 10–24)
BUN: 29 mg/dL — ABNORMAL HIGH (ref 8–27)
CO2: 31 mmol/L — ABNORMAL HIGH (ref 20–29)
Calcium: 8.8 mg/dL (ref 8.6–10.2)
Chloride: 100 mmol/L (ref 96–106)
Creatinine, Ser: 1 mg/dL (ref 0.76–1.27)
Glucose: 93 mg/dL (ref 70–99)
Potassium: 4.1 mmol/L (ref 3.5–5.2)
Sodium: 143 mmol/L (ref 134–144)
eGFR: 78 mL/min/{1.73_m2} (ref >59)

## 2023-07-21 LAB — CBC
Hematocrit: 37.7 % (ref 37.5–51.0)
Hemoglobin: 12.3 g/dL — ABNORMAL LOW (ref 13.0–17.7)
MCH: 31.3 pg (ref 26.6–33.0)
MCHC: 32.6 g/dL (ref 31.5–35.7)
MCV: 96 fL (ref 79–97)
Platelets: 197 10*3/uL (ref 150–450)
RBC: 3.93 x10E6/uL — ABNORMAL LOW (ref 4.14–5.80)
RDW: 12.1 % (ref 11.6–15.4)
WBC: 9.4 10*3/uL (ref 3.4–10.8)

## 2023-07-23 ENCOUNTER — Encounter (HOSPITAL_COMMUNITY): Payer: Self-pay

## 2023-07-25 ENCOUNTER — Encounter: Payer: Self-pay | Admitting: Internal Medicine

## 2023-07-25 ENCOUNTER — Ambulatory Visit: Admitting: Internal Medicine

## 2023-07-25 VITALS — BP 120/60 | HR 77 | Temp 98.9°F | Resp 20 | Ht 72.0 in | Wt 326.0 lb

## 2023-07-25 DIAGNOSIS — I2721 Secondary pulmonary arterial hypertension: Secondary | ICD-10-CM | POA: Insufficient documentation

## 2023-07-25 DIAGNOSIS — I10 Essential (primary) hypertension: Secondary | ICD-10-CM

## 2023-07-25 DIAGNOSIS — I480 Paroxysmal atrial fibrillation: Secondary | ICD-10-CM | POA: Diagnosis not present

## 2023-07-25 DIAGNOSIS — M1A09X0 Idiopathic chronic gout, multiple sites, without tophus (tophi): Secondary | ICD-10-CM

## 2023-07-25 DIAGNOSIS — L219 Seborrheic dermatitis, unspecified: Secondary | ICD-10-CM

## 2023-07-25 MED ORDER — ALLOPURINOL 100 MG PO TABS: 100.0000 mg | ORAL_TABLET | Freq: Every day | ORAL | 1 refills | Status: DC

## 2023-07-25 MED ORDER — CICLOPIROX OLAMINE 0.77 % EX CREA: TOPICAL_CREAM | Freq: Two times a day (BID) | CUTANEOUS | 3 refills | Status: AC

## 2023-07-25 NOTE — Progress Notes (Unsigned)
 Subjective:  Patient ID: [name], male    DOB: [birth year]  Age: 78 y.o. MRN: [id number]  CC: Rash   HPI TIMTOHY BROSKI presents for f/up ----  Discussed the use of AI scribe software for clinical note transcription with the patient, who gave verbal consent to proceed.  History of Present Illness   [name] "[name]" is a 78 year old male who presents for follow-up of pressure sores and seborrheic dermatitis.  Pressure sores on his legs have resolved. He notes that his blood pressure is on the low end but is not experiencing any symptoms of sepsis.  He has a rash on his scalp, identified as seborrheic dermatitis, which is characterized by flaking and occasional scratching. He applies Vaseline or Vaseline Intensive Care to the area. The rash does not cause significant itching, but it flakes after a day or two if scratched.  No fever, chills, or significant dizziness, although he felt dizzy while walking to the appointment. He attributes this to having to walk a longer distance than usual, noting that he typically uses a wheelchair and a cane was insufficient for the distance.   No gastrointestinal upset from recent antibiotic use.       Outpatient Medications Prior to Visit  Medication Sig Dispense Refill   atorvastatin  (LIPITOR ) 80 MG tablet TAKE 1 TABLET(80 MG) BY MOUTH DAILY 90 tablet 1   carboxymethylcellulose (REFRESH PLUS) 0.5 % SOLN Place 1 drop into both eyes 3 (three) times daily as needed (dry eyes).     [employer] MELATONIN GUMMIES PO Take 5 mg by mouth at bedtime.     Docusate Sodium  (DSS) 100 MG CAPS Take 100 mg by mouth every other day.     ELIQUIS  5 MG TABS tablet TAKE 1 TABLET(5 MG) BY MOUTH TWICE DAILY 180 tablet 0   empagliflozin  (JARDIANCE ) 10 MG TABS tablet TAKE 1 TABLET(10 MG) BY MOUTH DAILY BEFORE BREAKFAST 90 tablet 2   finasteride  (PROSCAR ) 5 MG tablet Take 1 tablet (5 mg total) by mouth daily. 30 tablet 1   flecainide  (TAMBOCOR ) 50 MG tablet  TAKE 1 TABLET(50 MG) BY MOUTH EVERY 12 HOURS 180 tablet 3   LORazepam  (ATIVAN ) 0.5 MG tablet Take 1 tablet (0.5 mg total) by mouth at bedtime. 30 tablet 2   metoprolol  succinate (TOPROL  XL) 25 MG 24 hr tablet Take 0.5 tablets (12.5 mg total) by mouth daily. 45 tablet 3   montelukast  (SINGULAIR ) 10 MG tablet TAKE 1 TABLET(10 MG) BY MOUTH AT BEDTIME 90 tablet 1   Multiple Vitamin (MULTIVITAMIN WITH MINERALS) TABS tablet Take 1 tablet by mouth daily.     pantoprazole  (PROTONIX ) 40 MG tablet TAKE 1 TABLET(40 MG) BY MOUTH DAILY 90 tablet 1   potassium chloride  SA (KLOR-CON  M) 20 MEQ tablet TAKE 1 TABLET(20 MEQ) BY MOUTH TWICE DAILY 60 tablet 0   rOPINIRole  (REQUIP ) 0.5 MG tablet TAKE 1 TABLET(0.5 MG) BY MOUTH THREE TIMES DAILY 270 tablet 4   sertraline  (ZOLOFT ) 100 MG tablet Take 2 tablets (200 mg total) by mouth at bedtime. 180 tablet 0   tirzepatide (MOUNJARO) 7.5 MG/0.5ML Pen Inject 7.5 mg into the skin once a week. 6 mL 0   torsemide  (DEMADEX ) 20 MG tablet TAKE 3 TABLETS BY MOUTH EVERY DAY 270 tablet 2   zinc  gluconate 50 MG tablet Take 1 tablet (50 mg total) by mouth daily. (Patient taking differently: Take 50 mg by mouth every evening.) 90 tablet 1   allopurinol  (  ZYLOPRIM ) 100 MG tablet TAKE 1 TABLET(100 MG) BY MOUTH DAILY 90 tablet 0   traMADol  (ULTRAM ) 50 MG tablet Take 1 tablet (50 mg total) by mouth 2 (two) times daily as needed for moderate pain (pain score 4-6). TAKE 1 TABLET BY MOUTH EVERY 8 HOURS AS NEEDED     cephALEXin  (KEFLEX ) 500 MG capsule Take 1 capsule (500 mg total) by mouth 3 (three) times daily. 15 capsule 0   methocarbamol  (ROBAXIN ) 500 MG tablet Take 1 tablet (500 mg total) by mouth 3 (three) times daily as needed for muscle spasms. 90 tablet 0   mupirocin  cream (BACTROBAN ) 2 % Apply topically 2 (two) times daily. Apply to affected wounds 30 g 1   No facility-administered medications prior to visit.    ROS Review of Systems  Objective:  BP 120/60 (BP Location: Left Arm,  Patient Position: Sitting, Cuff Size: Large)   Pulse 77   Temp 98.9 F (37.2 C) (Oral)   Resp 20   Ht 6' (1.829 m)   Wt (!) 326 lb (147.9 kg)   SpO2 98%   BMI 44.21 kg/m   BP Readings from Last 3 Encounters:  07/26/23 103/62  07/25/23 120/60  07/20/23 124/82    Wt Readings from Last 3 Encounters:  07/25/23 (!) 326 lb (147.9 kg)  07/20/23 (!) 325 lb 12.8 oz (147.8 kg)  06/24/23 (!) 329 lb 12.9 oz (149.6 kg)    Physical Exam Vitals reviewed.  Constitutional:      Appearance: He is obese. He is ill-appearing.  HENT:     Mouth/Throat:     Mouth: Mucous membranes are moist.  Eyes:     General: No scleral icterus.    Conjunctiva/sclera: Conjunctivae normal.  Cardiovascular:     Rate and Rhythm: Normal rate and regular rhythm.     Heart sounds: Heart sounds are distant. No murmur heard.    No friction rub. No gallop.  Pulmonary:     Effort: Pulmonary effort is normal.     Breath sounds: No stridor. No wheezing, rhonchi or rales.  Abdominal:     General: Abdomen is protuberant. Bowel sounds are normal. There is no distension.     Palpations: There is no mass.     Tenderness: There is no abdominal tenderness. There is no guarding.  Musculoskeletal:     Cervical back: Neck supple.  Lymphadenopathy:     Cervical: No cervical adenopathy.  Skin:    General: Skin is warm.     Findings: Rash present.  Neurological:     Mental Status: He is alert. Mental status is at baseline.  Psychiatric:        Mood and Affect: Mood normal.     Lab Results  Component Value Date   WBC 9.4 07/20/2023   HGB 12.3 (L) 07/20/2023   HCT 37.7 07/20/2023   PLT 197 07/20/2023   GLUCOSE 93 07/20/2023   CHOL 114 01/29/2023   TRIG 126.0 01/29/2023   HDL 28.70 (L) 01/29/2023   LDLDIRECT 91.0 05/16/2018   LDLCALC 61 01/29/2023   ALT 19 06/22/2023   AST 26 06/22/2023   NA 143 07/20/2023   K 4.1 07/20/2023   CL 100 07/20/2023   CREATININE 1.00 07/20/2023   BUN 29 (H) 07/20/2023   CO2 31  (H) 07/20/2023   TSH 3.119 06/23/2023   PSA 0.31 06/19/2022   INR 1.6 (H) 12/02/2022   HGBA1C 5.7 06/21/2023   MICROALBUR <0.7 06/21/2023    DG Chest Portable 1  View Result Date: 06/22/2023 CLINICAL DATA:  Sepsis EXAM: PORTABLE CHEST 1 VIEW COMPARISON:  X-ray 12/07/2022 FINDINGS: Borderline cardiopericardial silhouette. No consolidation, pneumothorax or effusion. No edema. Overlapping cardiac leads. Degenerative changes of the spine. IMPRESSION: Borderline cardiac enlargement.  No acute cardiopulmonary disease. Electronically Signed   By: Adrianna Horde M.D.   On: 06/22/2023 17:46    Assessment & Plan:  Chronic seborrheic dermatitis -     Ciclopirox  Olamine; Apply topically 2 (two) times daily.  Dispense: 90 g; Refill: 3  Idiopathic chronic gout of multiple sites without tophus -     Allopurinol ; Take 1 tablet (100 mg total) by mouth daily.  Dispense: 90 tablet; Refill: 1  PAF (paroxysmal atrial fibrillation) (HCC)- He has good R/R control.  Benign essential HTN- BP is well controlled.     Follow-up: Return in about 4 months (around 11/24/2023).  Sandra Crouch, MD

## 2023-07-25 NOTE — Patient Instructions (Signed)
Seborrheic Dermatitis, Adult Seborrheic dermatitis is a skin disease that causes red, scaly patches. It often occurs on the scalp, where it may be called dandruff. The patches may also appear on other parts of the body. Skin patches tend to occur where there are a lot of oil glands in the skin. Areas of the body that may be affected include: The scalp. The face, eyebrows, and ears. The area around a beard. Skin folds of the body. This includes the armpits, groin, and buttocks. The chest. The condition is often long-lasting (chronic). It may come and go for no known reason. It may be activated by a trigger, such as: Cold weather. Being out in the sun. Stress. Drinking alcohol. What are the causes? The cause of this condition is not known. It may be related to having too much yeast on the skin or changes in how your body's disease-fighting system (immune system) works. What increases the risk? You may be more likely to develop this condition if: You have a weak immune system. You are 78 years old or older. You have other conditions, such as: Human immunodeficiency virus (HIV) or acquired immunodeficiency virus (AIDS). Parkinson's disease. Mood disorders, such as depression. Liver problems. Obesity. What are the signs or symptoms? Symptoms of this condition include: Thick scales on the scalp. Redness on the face or in the armpits. Skin that is flaky. The flakes may be white or yellow. Skin that seems oily or dry but is not helped with moisturizers. Itching or burning in the affected areas. How is this diagnosed? This condition is diagnosed with a medical history and physical exam. A sample of your skin may be tested (skin biopsy). You may need to see a skin specialist (dermatologist). How is this treated? There is no cure for this condition, but treatment can help to manage the symptoms. You may get treatment to remove scales, lower the risk of skin infection, and reduce swelling or  itching. Treatment may include: Medicated shampoos, moisturizing creams, or ointments. Creams that reduce skin yeast. Creams that reduce swelling and irritation (steroids). Follow these instructions at home: Skin care Use any medicated shampoo, skin creams, or ointments only as told by your health care provider. Do not use skin products that contain alcohol. Take lukewarm baths or showers. Avoid very hot water. When you are outside, wear a hat and clothes that block UV light. General instructions Apply over-the-counter and prescription medicines only as told by your health care provider. Learn what triggers your symptoms so you can avoid these things. Use techniques for stress reduction, such as meditation or yoga. Do not drink alcohol if your health care provider tells you not to drink. Keep all follow-up visits. Your health care provider will check your skin to make sure the treatments are helping. Where to find more information American Academy of Dermatology: MarketingSheets.si Contact a health care provider if: Your symptoms do not get better with treatment. Your symptoms get worse. You have new symptoms. Get help right away if: Your condition quickly gets worse, even with treatment. This information is not intended to replace advice given to you by your health care provider. Make sure you discuss any questions you have with your health care provider. Document Revised: 08/19/2021 Document Reviewed: 08/19/2021 Elsevier Patient Education  2024 [employer].

## 2023-07-26 ENCOUNTER — Encounter: Payer: Medicare Other | Attending: Physical Medicine & Rehabilitation | Admitting: Registered Nurse

## 2023-07-26 VITALS — BP 103/62 | HR 67

## 2023-07-26 DIAGNOSIS — G8929 Other chronic pain: Secondary | ICD-10-CM | POA: Diagnosis not present

## 2023-07-26 DIAGNOSIS — M25562 Pain in left knee: Secondary | ICD-10-CM | POA: Insufficient documentation

## 2023-07-26 DIAGNOSIS — M25561 Pain in right knee: Secondary | ICD-10-CM | POA: Diagnosis not present

## 2023-07-26 DIAGNOSIS — Z79891 Long term (current) use of opiate analgesic: Secondary | ICD-10-CM | POA: Insufficient documentation

## 2023-07-26 DIAGNOSIS — G894 Chronic pain syndrome: Secondary | ICD-10-CM | POA: Insufficient documentation

## 2023-07-26 DIAGNOSIS — Z5181 Encounter for therapeutic drug level monitoring: Secondary | ICD-10-CM | POA: Diagnosis not present

## 2023-07-26 DIAGNOSIS — M48061 Spinal stenosis, lumbar region without neurogenic claudication: Secondary | ICD-10-CM | POA: Diagnosis not present

## 2023-07-26 DIAGNOSIS — I69398 Other sequelae of cerebral infarction: Secondary | ICD-10-CM | POA: Insufficient documentation

## 2023-07-26 DIAGNOSIS — R269 Unspecified abnormalities of gait and mobility: Secondary | ICD-10-CM | POA: Diagnosis not present

## 2023-07-26 MED ORDER — TRAMADOL HCL 50 MG PO TABS: 50.0000 mg | ORAL_TABLET | Freq: Three times a day (TID) | ORAL | 5 refills | Status: DC | PRN

## 2023-07-26 NOTE — Progress Notes (Signed)
 Subjective:    Patient ID: [name], male    DOB: 12/04/1945, 78 y.o.   MRN: [id number]  HPI: [name] is a 78 y.o. male who returns for follow up appointment for chronic pain and medication refill. He states his pain is located in his lower back and bilateral knee pain. He rates his pain 5. His current exercise regime is walking in his home with cane or walker.   [name] was admitted to [facility] on 06/22/2023 and discharged on 06/24/2023 with hypotension. Discharge summary was reviewed.   [name] Morphine equivalent is 30.00 MME.  He is also prescribed Lorazepam  by Dr. Carlos Chesterfield  .We have discussed the black box warning of using opioids and benzodiazepines. I highlighted the dangers of using these drugs together and discussed the adverse events including respiratory suppression, overdose, cognitive impairment and importance of compliance with current regimen. We will continue to monitor and adjust as indicated.  he is being closely monitored and under the care of his psychiatrist.  Oral Swab performed today.     Pain Inventory Average Pain 5 Pain Right Now 5 My pain is sharp and dull  In the last 24 hours, has pain interfered with the following? General activity 2 Relation with others 6 Enjoyment of life 8 What TIME of day is your pain at its worst? daytime Sleep (in general) Good  Pain is worse with: walking, bending, and standing Pain improves with: rest and medication Relief from Meds: 6  Family History  Problem Relation Age of Onset   Depression Mother    Hypertension Mother    Dementia Mother    Heart disease Father        MI   Depression Brother    Stroke Paternal Aunt        CVA   Diabetes Paternal Uncle    Heart disease Paternal Uncle        MI   Heart disease Paternal Grandmother        MI   Diabetes Cousin        PATERNAL   Social History   Socioeconomic History   Marital status: Married    Spouse name: Educational psychologist   Number of  children: 1   Years of education: Not on file   Highest education level: Not on file  Occupational History   Occupation: retired-Manager [employer]  Tobacco Use   Smoking status: Former    Current packs/day: 0.00    Average packs/day: 0.2 packs/day for 13.4 years (2.7 ttl pk-yrs)    Types: Cigarettes    Start date: 04/03/1961    Quit date: 08/30/1974    Years since quitting: 48.9   Smokeless tobacco: Never  Vaping Use   Vaping status: Never Used  Substance and Sexual Activity   Alcohol  use: No    Alcohol /week: 0.0 standard drinks of alcohol    Drug use: No   Sexual activity: Not Currently  Other Topics Concern   Not on file  Social History Narrative   Lives at home w/ his wife   Right-handed   Caffeine: decaf coffee, occasional tea   Social Drivers of Corporate investment banker Strain: High Risk (04/30/2023)   Overall Financial Resource Strain (CARDIA)    Difficulty of Paying Living Expenses: Hard  Food Insecurity: No Food Insecurity (06/25/2023)   Hunger Vital Sign    Worried About Running Out of Food in the Last Year: Never true    Ran Out of Food in  the Last Year: Never true  Transportation Needs: No Transportation Needs (06/25/2023)   PRAPARE - Administrator, Civil Service (Medical): No    Lack of Transportation (Non-Medical): No  Physical Activity: Inactive (04/30/2023)   Exercise Vital Sign    Days of Exercise per Week: 0 days    Minutes of Exercise per Session: 0 min  Stress: No Stress Concern Present (04/30/2023)   [employer] of Occupational Health - Occupational Stress Questionnaire    Feeling of Stress : Not at all  Social Connections: Moderately Isolated (06/22/2023)   Social Connection and Isolation Panel [NHANES]    Frequency of Communication with Friends and Family: More than three times a week    Frequency of Social Gatherings with Friends and Family: Never    Attends Religious Services: Never    Database administrator or Organizations: No     Attends Engineer, structural: Never    Marital Status: Married   Past Surgical History:  Procedure Laterality Date   ARTHROSCOPY KNEE W/ DRILLING     BREATH TEK H PYLORI N/A 12/23/2012   Procedure: BREATH TEK H PYLORI;  Surgeon: Fran Imus, MD;  Location: [facility];  Service: General;  Laterality: N/A;   CHOLECYSTECTOMY  1989   COLONOSCOPY N/A 07/31/2013   Procedure: COLONOSCOPY;  Surgeon: Kenney Peacemaker, MD;  Location: [facility] ENDOSCOPY;  Service: Endoscopy;  Laterality: N/A;   ESOPHAGOGASTRODUODENOSCOPY N/A 07/22/2013   Procedure: ESOPHAGOGASTRODUODENOSCOPY (EGD);  Surgeon: Tobin Forts, MD;  Location: [facility] ENDOSCOPY;  Service: Endoscopy;  Laterality: N/A;   ESOPHAGOGASTRODUODENOSCOPY N/A 07/31/2013   Procedure: ESOPHAGOGASTRODUODENOSCOPY (EGD);  Surgeon: Kenney Peacemaker, MD;  Location: [facility] ENDOSCOPY;  Service: Endoscopy;  Laterality: N/A;   INTRAOPERATIVE TRANSESOPHAGEAL ECHOCARDIOGRAM N/A 07/18/2013   Procedure: INTRAOPERATIVE TRANSESOPHAGEAL ECHOCARDIOGRAM;  Surgeon: Norita Beauvais, MD;  Location: [facility] OR;  Service: Open Heart Surgery;  Laterality: N/A;   LAPAROSCOPIC GASTRIC BANDING WITH HIATAL HERNIA REPAIR N/A 04/08/2013   Procedure: LAPAROSCOPIC GASTRIC BANDING WITH possible  HIATAL HERNIA REPAIR;  Surgeon: Fran Imus, MD;  Location: [facility] ORS;  Service: General;  Laterality: N/A;   LITHOTRIPSY     renal calculi     RIGHT AND LEFT HEART CATH N/A 05/12/2021   Procedure: RIGHT AND LEFT HEART CATH;  Surgeon: Mardell Shade, MD;  Location: [facility] INVASIVE CV LAB;  Service: Cardiovascular;  Laterality: N/A;   SUBXYPHOID PERICARDIAL WINDOW N/A 07/18/2013   Procedure: SUBXYPHOID PERICARDIAL WINDOW;  Surgeon: Norita Beauvais, MD;  Location: [facility] OR;  Service: Thoracic;  Laterality: N/A;   TONSILLECTOMY     total knee replacment     bilat   trigger finger repair     also lue, cts surgically repaired   WISDOM TOOTH EXTRACTION     Past Surgical History:  Procedure Laterality Date    ARTHROSCOPY KNEE W/ DRILLING     BREATH TEK H PYLORI N/A 12/23/2012   Procedure: BREATH TEK Jorja Newport;  Surgeon: Fran Imus, MD;  Location: [facility] ENDOSCOPY;  Service: General;  Laterality: N/A;   CHOLECYSTECTOMY  1989   COLONOSCOPY N/A 07/31/2013   Procedure: COLONOSCOPY;  Surgeon: Kenney Peacemaker, MD;  Location: [facility] ENDOSCOPY;  Service: Endoscopy;  Laterality: N/A;   ESOPHAGOGASTRODUODENOSCOPY N/A 07/22/2013   Procedure: ESOPHAGOGASTRODUODENOSCOPY (EGD);  Surgeon: Tobin Forts, MD;  Location: [facility] ENDOSCOPY;  Service: Endoscopy;  Laterality: N/A;   ESOPHAGOGASTRODUODENOSCOPY N/A 07/31/2013   Procedure: ESOPHAGOGASTRODUODENOSCOPY (EGD);  Surgeon: Kenney Peacemaker, MD;  Location:  [facility] ENDOSCOPY;  Service: Endoscopy;  Laterality: N/A;   INTRAOPERATIVE TRANSESOPHAGEAL ECHOCARDIOGRAM N/A 07/18/2013   Procedure: INTRAOPERATIVE TRANSESOPHAGEAL ECHOCARDIOGRAM;  Surgeon: Norita Beauvais, MD;  Location: [facility] OR;  Service: Open Heart Surgery;  Laterality: N/A;   LAPAROSCOPIC GASTRIC BANDING WITH HIATAL HERNIA REPAIR N/A 04/08/2013   Procedure: LAPAROSCOPIC GASTRIC BANDING WITH possible  HIATAL HERNIA REPAIR;  Surgeon: Fran Imus, MD;  Location: [facility] ORS;  Service: General;  Laterality: N/A;   LITHOTRIPSY     renal calculi     RIGHT AND LEFT HEART CATH N/A 05/12/2021   Procedure: RIGHT AND LEFT HEART CATH;  Surgeon: Mardell Shade, MD;  Location: [facility] INVASIVE CV LAB;  Service: Cardiovascular;  Laterality: N/A;   SUBXYPHOID PERICARDIAL WINDOW N/A 07/18/2013   Procedure: SUBXYPHOID PERICARDIAL WINDOW;  Surgeon: Norita Beauvais, MD;  Location: [facility] OR;  Service: Thoracic;  Laterality: N/A;   TONSILLECTOMY     total knee replacment     bilat   trigger finger repair     also lue, cts surgically repaired   WISDOM TOOTH EXTRACTION     Past Medical History:  Diagnosis Date   Anemia 07/29/2013   Asthma    childhood asthma   Benign essential HTN    Benign prostatic hypertrophy    several prostate biopsies neg for  cancer.    Bilateral hip pain    Cancer (HCC)    CHF (congestive heart failure) (HCC)    Chronic anticoagulation 07/22/2013   Complication of anesthesia    MORPHINE CAUSES HALLUCINATIONS   CVA (cerebral vascular accident) (HCC) 06/2016   Depression    Diabetes mellitus without complication (HCC)    Diastolic congestive heart failure (HCC) 07/16/2013   Dysphagia, post-stroke    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   FH: colonic polyps 2010   Gout    History of kidney stones    Hypertension    Kidney cysts    Morbid obesity (HCC)    Numbness    feet / legs   PAF (paroxysmal atrial fibrillation) with RVR 07/29/2013   Pericarditis 2015   s/p window   Recurrent pulmonary emboli (HCC) 06/13/2015   Sleep apnea    USES C PAP   Stroke syndrome    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   BP 103/62   Pulse 67   SpO2 94%   Opioid Risk Score:   Fall Risk Score:  `1  Depression screen PHQ 2/9     04/30/2023    2:45 PM 01/29/2023    1:31 PM 10/10/2022    3:04 PM 06/19/2022    2:24 PM 04/25/2022    1:56 PM 05/24/2021    2:11 PM 12/16/2020    4:15 PM  Depression screen PHQ 2/9  Decreased Interest 0 0 0 0 0 1 0  Down, Depressed, Hopeless 0 0 0 0 0 1 0  PHQ - 2 Score 0 0 0 0 0 2 0  Altered sleeping 0 1 0   0   Tired, decreased energy 0 1 0   0   Change in appetite 0 0 0   0   Feeling bad or failure about yourself  0 0 0   0   Trouble concentrating 1 0 0   0   Moving slowly or fidgety/restless 0 0 0   0   Suicidal thoughts 0 0 0   0   PHQ-9 Score 1 2 0   2   Difficult doing work/chores Not difficult at  all Somewhat difficult          Review of Systems  Musculoskeletal:  Positive for back pain.  All other systems reviewed and are negative.     Objective:   Physical Exam Vitals and nursing note reviewed.  Constitutional:      Appearance: Normal appearance.  Cardiovascular:     Rate and Rhythm: Normal rate and regular rhythm.     Pulses: Normal pulses.     Heart sounds:  Normal heart sounds.  Pulmonary:     Effort: Pulmonary effort is normal.     Breath sounds: Normal breath sounds.     Comments: Continuous Oxygen  2 Liters nasal cannula Musculoskeletal:     Comments: Normal Muscle Bulk and Muscle Testing Reveals:  Upper Extremities: Full ROM and Muscle Strength 5/5 Lumbar Paraspinal Tenderness: L-4-L-5 Lower Extremities: Full ROM and Muscle Strength 5/5 Arises from Table Slowly Narrow Based  Gait     Skin:    General: Skin is warm and dry.  Neurological:     Mental Status: He is alert and oriented to person, place, and time.  Psychiatric:        Mood and Affect: Mood normal.        Behavior: Behavior normal.         Assessment & Plan:  1.Chronic Low Back Pain/  Lumbosacral Spondylosis without myelopathy: S/P 09/03/2020:  Bilateral Lumbar L3, L4 medial branch blocks and L 5 dorsal ramus injection under fluoroscopic guidance   with good relief noted. Encouraged to increase HEP as tolerated. Continue to monitor. 07/26/2023 2. Chronic Deconditioning/ Morbid Obesity History of Wallenberg Syndrome: Continue  HEP as Tolerated. He verbalizes understanding. Continue to Monitor. 07/26/2023 3. Paresthesia Lower Extremities: Continue Tramadol . Continue to Monitor. 07/26/2023 4. Bilateral Chronic Knee pain: Continue HEP as Tolerated. Continue to Monitor.  07/26/2023 5. Chronic Pain Syndrome: Refilled Tramadol   50 mg one tablet three times a day as needed for pain #90. Continue to monitor.  We will continue the opioid monitoring program, this consists of regular clinic visits, examinations, urine drug screen, pill counts as well as use of [place]  Controlled Substance Reporting system.10424/2025 6. Bradycardia: Mr. Rumery reports Cardiology Following. Continue to Monitor.07/26/2023  7. Polyneuropathy: Continue Tramadol . Continue to Monitor. 07/26/2023 8. Chronic Ankle Pain: No complaints today. Continue HEP as Tolerated. Continue current medication  regimen. Continue to Monitor. 07/26/2023. 9. Chronic Bilateral Shoulder Pain: No complaints today. Continue HEP as Tolerated. Continue to Monitor. 07/26/2023. F/U in 6 months

## 2023-07-31 DIAGNOSIS — N401 Enlarged prostate with lower urinary tract symptoms: Secondary | ICD-10-CM | POA: Diagnosis not present

## 2023-07-31 DIAGNOSIS — R350 Frequency of micturition: Secondary | ICD-10-CM | POA: Diagnosis not present

## 2023-07-31 DIAGNOSIS — M109 Gout, unspecified: Secondary | ICD-10-CM | POA: Diagnosis not present

## 2023-07-31 DIAGNOSIS — N200 Calculus of kidney: Secondary | ICD-10-CM | POA: Diagnosis not present

## 2023-07-31 LAB — DRUG TOX MONITOR 1 W/CONF, ORAL FLD
Alprazolam: NEGATIVE ng/mL (ref <0.50)
Aminoclonazepam: NEGATIVE ng/mL (ref <0.50)
Amphetamines: NEGATIVE ng/mL (ref <10)
Barbiturates: NEGATIVE ng/mL (ref <10)
Benzodiazepines: POSITIVE ng/mL — AB (ref <0.50)
Buprenorphine: NEGATIVE ng/mL (ref <0.10)
Chlordiazepoxide: NEGATIVE ng/mL (ref <0.50)
Clonazepam: NEGATIVE ng/mL (ref <0.50)
Cocaine: NEGATIVE ng/mL (ref <5.0)
Diazepam: NEGATIVE ng/mL (ref <0.50)
Fentanyl: NEGATIVE ng/mL (ref <0.10)
Flunitrazepam: NEGATIVE ng/mL (ref <0.50)
Flurazepam: NEGATIVE ng/mL (ref <0.50)
Heroin Metabolite: NEGATIVE ng/mL (ref <1.0)
Lorazepam: 0.72 ng/mL — ABNORMAL HIGH (ref <0.50)
MARIJUANA: NEGATIVE ng/mL (ref <2.5)
MDMA: NEGATIVE ng/mL (ref <10)
Meprobamate: NEGATIVE ng/mL (ref <2.5)
Methadone: NEGATIVE ng/mL (ref <5.0)
Midazolam: NEGATIVE ng/mL (ref <0.50)
Nicotine Metabolite: NEGATIVE ng/mL (ref <5.0)
Nordiazepam: NEGATIVE ng/mL (ref <0.50)
Opiates: NEGATIVE ng/mL (ref <2.5)
Oxazepam: NEGATIVE ng/mL (ref <0.50)
Phencyclidine: NEGATIVE ng/mL (ref <10)
Tapentadol: NEGATIVE ng/mL (ref <5.0)
Temazepam: NEGATIVE ng/mL (ref <0.50)
Tramadol: >500 ng/mL — ABNORMAL HIGH (ref <5.0)
Tramadol: POSITIVE ng/mL — AB (ref <5.0)
Triazolam: NEGATIVE ng/mL (ref <0.50)
Zolpidem: NEGATIVE ng/mL (ref <5.0)

## 2023-07-31 LAB — DRUG TOX ALC METAB W/CON, ORAL FLD: Alcohol Metabolite: NEGATIVE ng/mL (ref <25)

## 2023-08-02 ENCOUNTER — Encounter: Payer: Self-pay | Admitting: Pulmonary Disease

## 2023-08-02 ENCOUNTER — Ambulatory Visit: Admitting: Pulmonary Disease

## 2023-08-02 ENCOUNTER — Encounter: Payer: Self-pay | Admitting: Registered Nurse

## 2023-08-02 VITALS — BP 128/72 | HR 59 | Ht 73.0 in | Wt 329.8 lb

## 2023-08-02 DIAGNOSIS — G4733 Obstructive sleep apnea (adult) (pediatric): Secondary | ICD-10-CM

## 2023-08-02 DIAGNOSIS — I272 Pulmonary hypertension, unspecified: Secondary | ICD-10-CM | POA: Diagnosis not present

## 2023-08-02 DIAGNOSIS — R600 Localized edema: Secondary | ICD-10-CM

## 2023-08-02 DIAGNOSIS — Z87891 Personal history of nicotine dependence: Secondary | ICD-10-CM | POA: Diagnosis not present

## 2023-08-02 DIAGNOSIS — J9611 Chronic respiratory failure with hypoxia: Secondary | ICD-10-CM | POA: Diagnosis not present

## 2023-08-02 NOTE — Patient Instructions (Addendum)
 Article about group 2 like you  https://www.ahajournals.org/doi/10.1161/HYPERTENSIONAHA.130.86578  Article or review of all groups  https://phassociation.org/types-pulmonary-hypertension-groups/

## 2023-08-07 ENCOUNTER — Encounter: Payer: Self-pay | Admitting: Pulmonary Disease

## 2023-08-07 NOTE — Progress Notes (Signed)
 @Patient  ID: [name], male    DOB: June 07, 1945, 78 y.o.   MRN: [id number]  Chief Complaint  Patient presents with   Consult    Transfer of care. Patient has questions about medication and his diagnosis (pulmonary hypertension)    Referring provider: Arcadio Knuckles, MD  HPI:   78 y.o. man with history of OSA on NIPPV at night whom we are seeing for evaluation of pulmonary hypertension.  Multiple prior pulmonary notes, Dr. Linder Revere, managing OSA reviewed.  Multiple cardiology notes reviewed.  Patient referred to me for further discussion regarding pulmonary hypertension.  He had a right heart catheterization in 2023 that shows clear evidence of postcapillary pulmonary hypertension, WHO group 2 with PVR less than 2, elevated wedge, wedge further elevated significantly with exertion.  We discussed at length the etiology of pulmonary hypertension in general, it seems he is read a lot on the Internet about PAH.  We differentiated his pulmonary hypertension from [facility].  We discussed at length the etiology of pulmonary hypertension in him specifically.  We discussed the role and rationale for treatment and discussed strategies at treating his pulmonary hypertension.  Tried to answer questions to the best of my ability.  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  We discussed the chronic nature of this disease and likely worsening in the future but hopefully with WHO group 2 this will be a slow decline.  We discussed briefly mortality as a relates to pulmonary hypertension using risk calculator.  Questionaires / Pulmonary Flowsheets:   ACT:      No data to display          MMRC:     No data to display          Epworth:     08/28/2012   11:06 AM  Results of the Epworth flowsheet  Sitting and reading 2  Watching TV 2  Sitting, inactive in a public place (e.g. a theatre  or a meeting) 0  As a passenger in a car for an hour without a break 0  Lying down to rest in the afternoon when circumstances permit 2  Sitting and talking to someone 0  Sitting quietly after a lunch without alcohol  2  In a car, while stopped for a few minutes in traffic 0  Total score 8    Tests:   FENO:  No results found for: "NITRICOXIDE"  PFT:    Latest Ref Rng & Units 01/03/2017    2:03 PM  PFT Results  FVC-Pre L 3.98   FVC-Predicted Pre % 84   FVC-Post L 3.94   FVC-Predicted Post % 83   Pre FEV1/FVC % % 74   Post FEV1/FCV % % 76   FEV1-Pre L 2.96   FEV1-Predicted Pre % 85   FEV1-Post L 3.00   DLCO uncorrected ml/min/mmHg 27.43   DLCO UNC% % 78   DLCO corrected ml/min/mmHg 28.63   DLCO COR %Predicted % 81   DLVA Predicted % 98   TLC L 7.13   TLC % Predicted % 96   RV % Predicted % 114     WALK:     08/02/2023    4:37 PM 03/25/2021   10:50 AM 03/25/2021   10:45 AM 04/08/2018    1:48 PM 12/27/2016    3:08 PM  SIX MIN WALK  Supplimental Oxygen  during Test? (L/min) Yes Yes No No  No  O2 Flow Rate  2 L/min     Type Pulse Continuous     Tech Comments: Patient will need 2L of poc Pt's saturations remained stable on 2L O2. pt was stopped and placed on O2. Pt stopped due to fatigue and SOB pt completed 3 laps at avg pace with can, bobbled on third lap and very SOB on last two laps. kw    Imaging: Personally reviewed and as per EMR and discussion in this note No results found.  Lab Results: Personally reviewed CBC    Component Value Date/Time   WBC 9.4 07/20/2023 1507   WBC 9.0 06/24/2023 0314   RBC 3.93 (L) 07/20/2023 1507   RBC 3.81 (L) 06/24/2023 0314   HGB 12.3 (L) 07/20/2023 1507   HCT 37.7 07/20/2023 1507   PLT 197 07/20/2023 1507   MCV 96 07/20/2023 1507   MCH 31.3 07/20/2023 1507   MCH 30.7 06/24/2023 0314   MCHC 32.6 07/20/2023 1507   MCHC 31.9 06/24/2023 0314   RDW 12.1 07/20/2023 1507   LYMPHSABS 2.2 06/24/2023 0314   MONOABS 0.9 06/24/2023  0314   EOSABS 0.2 06/24/2023 0314   BASOSABS 0.1 06/24/2023 0314    BMET    Component Value Date/Time   NA 143 07/20/2023 1507   K 4.1 07/20/2023 1507   CL 100 07/20/2023 1507   CO2 31 (H) 07/20/2023 1507   GLUCOSE 93 07/20/2023 1507   GLUCOSE 85 06/24/2023 0314   BUN 29 (H) 07/20/2023 1507   CREATININE 1.00 07/20/2023 1507   CREATININE 1.18 10/27/2019 1400   CALCIUM  8.8 07/20/2023 1507   GFRNONAA >60 06/24/2023 0314   GFRNONAA 61 10/27/2019 1400   GFRAA 71 10/27/2019 1400    BNP    Component Value Date/Time   BNP 771.9 (H) 12/02/2022 0900    ProBNP    Component Value Date/Time   PROBNP 71.0 03/22/2021 1221    Specialty Problems       Pulmonary Problems   OSA (obstructive sleep apnea)   NPSG 10/25/05- severe OSA, AHI 79/ hr, CPAP to 16. Frequent limb jerks with sleep disturbance      Vocal fold paralysis, left   Chronic respiratory failure with hypoxia (HCC)   O2 at home dropped to 75%, it takes him 5-10 mins to recover 90% New oxygen  requirement 03/22/2021        Allergies  Allergen Reactions   Morphine Nausea And Vomiting and Other (See Comments)    Severe hallucinations Other reaction(s): Hallucinations   Penicillins Other (See Comments)    Passed out after injection Did it involve swelling of the face/tongue/throat, SOB, or low BP? Unknown Did it involve sudden or severe rash/hives, skin peeling, or any reaction on the inside of your mouth or nose? No Did you need to seek medical attention at a hospital or doctor's office? Yes When did it last happen?    teenager   If all above answers are "NO", may proceed with cephalosporin use. Other reaction(s): Dizziness, Unknown   Codeine Nausea Only   Propoxyphene N-Acetaminophen  Nausea Only    Immunization History  Administered Date(s) Administered   Fluad Quad(high Dose 65+) 12/11/2018, 02/18/2021, 02/07/2022   Fluad Trivalent(High Dose 65+) 01/29/2023   Influenza, High Dose Seasonal PF 01/20/2016,  01/31/2017, 03/06/2018   Influenza,inj,Quad PF,6+ Mos 04/09/2013   Influenza-Unspecified 03/08/2018, 03/02/2020   PFIZER(Purple Top)SARS-COV-2 Vaccination 05/10/2019, 06/04/2019, 03/02/2020, 02/18/2021   Pneumococcal Conjugate-13 10/20/2015   Pneumococcal Polysaccharide-23 04/09/2013, 10/27/2019   Td 11/02/2009  Tdap 01/20/2016    Past Medical History:  Diagnosis Date   Anemia 07/29/2013   Asthma    childhood asthma   Benign essential HTN    Benign prostatic hypertrophy    several prostate biopsies neg for cancer.    Bilateral hip pain    Cancer (HCC)    CHF (congestive heart failure) (HCC)    Chronic anticoagulation 07/22/2013   Complication of anesthesia    MORPHINE CAUSES HALLUCINATIONS   CVA (cerebral vascular accident) (HCC) 06/2016   Depression    Diabetes mellitus without complication (HCC)    Diastolic congestive heart failure (HCC) 07/16/2013   Dysphagia, post-stroke    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   FH: colonic polyps 2010   Gout    History of kidney stones    Hypertension    Kidney cysts    Morbid obesity (HCC)    Numbness    feet / legs   PAF (paroxysmal atrial fibrillation) with RVR 07/29/2013   Pericarditis 2015   s/p window   Recurrent pulmonary emboli (HCC) 06/13/2015   Sleep apnea    USES C PAP   Stroke syndrome    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   atorvastatin  (LIPITOR ) 80 MG tablet TAKE 1 TABLET(80 MG) BY MOUTH DAILY    carboxymethylcellulose (REFRESH PLUS) 0.5 % SOLN Place 1 drop into both eyes 3 (three) times daily as needed (dry eyes).   ciclopirox  (LOPROX ) 0.77 % cream Apply topically 2 (two) times daily.   [employer] MELATONIN GUMMIES PO Take 5 mg by mouth at bedtime.   Docusate Sodium  (DSS) 100 MG CAPS Take 100 mg by mouth every other day.   ELIQUIS  5 MG TABS tablet TAKE 1 TABLET(5 MG) BY MOUTH TWICE DAILY   empagliflozin  (JARDIANCE ) 10 MG TABS tablet TAKE 1 TABLET(10 MG) BY MOUTH DAILY BEFORE BREAKFAST   finasteride  (PROSCAR ) 5 MG tablet Take 1 tablet (5 mg total) by mouth daily.   flecainide  (TAMBOCOR ) 50 MG tablet TAKE 1 TABLET(50 MG) BY MOUTH EVERY 12 HOURS   LORazepam  (ATIVAN ) 0.5 MG tablet Take 1 tablet (0.5 mg total) by mouth at bedtime.   metoprolol  succinate (TOPROL  XL) 25 MG 24 hr tablet Take 0.5 tablets (12.5 mg total) by mouth daily.   montelukast  (SINGULAIR ) 10 MG tablet TAKE 1 TABLET(10 MG) BY MOUTH AT BEDTIME   Multiple Vitamin (MULTIVITAMIN WITH MINERALS) TABS tablet Take 1 tablet by mouth daily.   pantoprazole  (PROTONIX ) 40 MG tablet TAKE 1 TABLET(40 MG) BY MOUTH DAILY   potassium chloride  SA (KLOR-CON  M) 20 MEQ tablet TAKE 1 TABLET(20 MEQ) BY MOUTH TWICE DAILY   rOPINIRole  (REQUIP ) 0.5 MG tablet TAKE 1 TABLET(0.5 MG) BY MOUTH THREE TIMES DAILY   sertraline  (ZOLOFT ) 100 MG tablet Take 2 tablets (200 mg total) by mouth at bedtime.   tirzepatide (MOUNJARO) 7.5 MG/0.5ML Pen Inject 7.5 mg into the skin once a week.   torsemide  (DEMADEX ) 20 MG tablet TAKE 3 TABLETS BY  MOUTH EVERY DAY   traMADol  (ULTRAM ) 50 MG tablet Take 1 tablet (50 mg total) by mouth 3 (three) times daily as needed for moderate pain (pain score 4-6).   zinc  gluconate 50 MG tablet Take 1 tablet (50 mg total) by mouth daily. (Patient taking differently: Take 50 mg by mouth every evening.)   No facility-administered encounter medications on file as of 08/02/2023.     Review of Systems  Review of Systems  No chest pain with  exertion.  No orthopnea or PND.  Comprehensive review of systems otherwise negative. Physical Exam  BP 128/72 (BP Location: Left Arm, Patient Position: Sitting, Cuff Size: Normal)   Pulse (!) 59   Ht 6\' 1"  (1.854 m)   Wt (!) 161 lb 12.8 oz (149.6 kg)   SpO2 98%   BMI 43.51 kg/m   Wt Readings from Last 5 Encounters:  08/02/23 (!) 329 lb 12.8 oz (149.6 kg)  07/25/23 (!) 326 lb (147.9 kg)  07/20/23 (!) 325 lb 12.8 oz (147.8 kg)  06/24/23 (!) 329 lb 12.9 oz (149.6 kg)  06/05/23 (!) 329 lb (149.2 kg)    BMI Readings from Last 5 Encounters:  08/02/23 43.51 kg/m  07/25/23 44.21 kg/m  07/20/23 44.19 kg/m  06/24/23 44.73 kg/m  06/21/23 44.62 kg/m     Physical Exam General: Sitting in wheelchair, in no acute distress Eyes: EOMI, no icterus Neck: Supple, no JVP Pulmonary: Clear, no work of breathing Cardiovascular: Edema noted bilaterally in the lower extremities, warm Abdomen: Nondistended MSK: No synovitis, no joint effusion Neuro: No focal deficits noted Psych: Normal mood, full affect   Assessment & Plan:   Pulmonary hypertension: Postcapillary, WHO group 2 in the setting of presumed diastolic dysfunction, valvular dysfunction, as demonstrated on serial TTE's and right heart catheterization in 2023 with elevated wedge pressure as well as PVR less than 2.  With exercise his weight is markedly increased during right heart catheterization.  Encouraged low-salt diet, strict attendance of diuretics, ongoing efforts at weight loss.  Reveal light score low risk.  OSA: Encouraged ongoing NIPPV at night.  He is benefiting clinically from this.  Compliance report reviewed, he is using every night.  Chronic hypoxemic respiratory failure: Suspect related to pulmonary venous congestion.  New order for POC today.   Return in about 6 months (around 02/02/2024) for f/u Dr. Marygrace Snellen.   Guerry Leek, MD 08/07/2023   This appointment required 42 minutes of patient care (this  includes precharting, chart review, review of results, face-to-face care, etc.).

## 2023-08-20 ENCOUNTER — Encounter: Payer: Self-pay | Admitting: Physical Medicine & Rehabilitation

## 2023-08-25 ENCOUNTER — Other Ambulatory Visit: Payer: Self-pay | Admitting: Internal Medicine

## 2023-08-27 ENCOUNTER — Other Ambulatory Visit: Payer: Self-pay | Admitting: Registered Nurse

## 2023-08-28 ENCOUNTER — Ambulatory Visit: Payer: Self-pay | Admitting: *Deleted

## 2023-08-28 NOTE — Telephone Encounter (Signed)
 Copied from CRM [id number]. Topic: Clinical - Red Word Triage >> Aug 28, 2023  9:37 AM Star [place] wrote: Red Word that prompted transfer to Nurse Triage: pain in back and right leg, level 9 pain, already  has trouble walking due to stroke- pain meds not helping Reason for Disposition  [1] MODERATE pain (e.g., interferes with normal activities, limping) AND [2] present > 3 days  Answer Assessment - Initial Assessment Questions 1. ONSET: "When did the pain start?"      The pain in in my lumbar area at L1-L3 area.  It's affecting my right leg.   If I walk my right leg hurts so bad.   My left leg is good.    I have a problem there.   Over the years they put in pain blocker.   They finally got the pain under control.  Now the pain blocker is no longer effective.   Usually if I have a problem with pain I can get through it.   I was on Tramadol  for the pain.   I've been taking the Tramadol  but it's not helping at all.    My pain management doctor is out of the office until early June.     I need something for the pain.    2. LOCATION: "Where is the pain located?"      Lower back area.    It's my sciatic nerve going down my right leg. 3. PAIN: "How bad is the pain?"    (Scale 1-10; or mild, moderate, severe)   -  MILD (1-3): doesn't interfere with normal activities    -  MODERATE (4-7): interferes with normal activities (e.g., work or school) or awakens from sleep, limping    -  SEVERE (8-10): excruciating pain, unable to do any normal activities, unable to walk     Severe lower back pain radiating down my right leg 4. WORK OR EXERCISE: "Has there been any recent work or exercise that involved this part of the body?"      No    5. CAUSE: "What do you think is causing the leg pain?"     Sciatic pain    I've had this problem a long time. 6. OTHER SYMPTOMS: "Do you have any other symptoms?" (e.g., chest pain, back pain, breathing difficulty, swelling, rash, fever, numbness, weakness)     Pain down right  leg with walking 7. PREGNANCY: "Is there any chance you are pregnant?" "When was your last menstrual period?"     N/A  Protocols used: Leg Pain-A-AH  Chief Complaint: Severe lower back pain with radiation down his right leg.   This is a chronic problem but lately the Tramadol  is not helping.  The pain blocker given to him by his pain management doctor, Dr. Sharl Davies has worn off.   Dr. Sharl Davies is on vacation until the first of June.   Pt requesting something stronger for the pain until he is able to get back in and see Dr. Sharl Davies in June. "Dr. Rochelle Chu is aware of my problem with my back".   "I just need something to help with this pain until I can get in to see Dr. Sharl Davies".   Symptoms: severe lower back pain going down his right leg.   Frequency: Chronic problem but recently the pain blocker has worn off and the pain is really severe.   Requesting stronger medication for pain until he can get back into see Dr. Sharl Davies. Pertinent Negatives: Patient denies Tramadol   helping this time.   It usually does but this pain is worse. Disposition: [] ED /[] Urgent Care (no appt availability in office) / [] Appointment(In office/virtual)/ []  [facility]/ [] Home Care/ [] Refused Recommended Disposition /[] [facility]/ [x]  Follow-up with PCP Additional Notes: No appts with any of the providers until June.   Pt asking if Dr. Rochelle Chu can prescribe him stronger pain medication until he can get in to see the pain management doctor once he is back in town.

## 2023-08-29 NOTE — Telephone Encounter (Signed)
 Patient has been scheduled for an appointment.

## 2023-08-31 ENCOUNTER — Ambulatory Visit (INDEPENDENT_AMBULATORY_CARE_PROVIDER_SITE_OTHER): Admitting: Family Medicine

## 2023-08-31 VITALS — BP 106/60 | HR 73 | Temp 98.7°F | Ht 73.0 in

## 2023-08-31 DIAGNOSIS — G894 Chronic pain syndrome: Secondary | ICD-10-CM | POA: Diagnosis not present

## 2023-08-31 DIAGNOSIS — M5441 Lumbago with sciatica, right side: Secondary | ICD-10-CM | POA: Diagnosis not present

## 2023-08-31 MED ORDER — METHYLPREDNISOLONE 4 MG PO TBPK: ORAL_TABLET | ORAL | 0 refills | Status: DC

## 2023-08-31 NOTE — Patient Instructions (Addendum)
 [employer] Track Smart Upper Arm Blood Pressure Monitor with Wide Range Cuff That fits Standard to Large Adult Arms, Bluetooth Compatible for iOS & Android Devices  I have sent in methylprednisolone  dosepak. Take by mouth daily for 6 days. Take 6 tablets on day 1, 5 tablets on day 2, 4 tablets on day 3, 3 tablets on day 4, 2 tablets on day 5, 1 tablet on day 6  Follow up with Dr Sharl Davies as scheduled.

## 2023-08-31 NOTE — Progress Notes (Signed)
   Acute Office Visit  Subjective:     Patient ID: [name], male    DOB: [birth year], 78 y.o.   MRN: [id number]  Chief Complaint  Patient presents with   Back Pain    Right lower back extreme pain making it hard to walk or move.    HPI Patient is in today for evaluation of right low back pain that is extending down into the right hip and right upper leg. Has history of chronic back pain and sees Dr. Sharl Davies with physical medicine.  States that he tried to get in that office and was told that he needed to try to get in with his PCP. Has been taking tramadol  50 mg 3 times daily with no relief.  States is been going on for the last 2 to 3 weeks and progressively worsening. Denies numbness, tingling, strength in the extremity, other concerns today. Medical history as outlined below. Of note, under pain contract with physical medicine.  ROS Per HPI      Objective:    BP 106/60   Pulse 73   Temp 98.7 F (37.1 C) (Temporal)   Ht 6\' 1"  (1.854 m)   SpO2 95%   BMI 43.51 kg/m    Physical Exam Vitals and nursing note reviewed.  Constitutional:      General: He is not in acute distress.    Comments: Appears uncomfortable, chronically ill-appearing  HENT:     Head: Normocephalic and atraumatic.  Eyes:     Extraocular Movements: Extraocular movements intact.  Cardiovascular:     Rate and Rhythm: Normal rate.  Pulmonary:     Effort: Pulmonary effort is normal.     Comments: On O2 via [place] Musculoskeletal:     Cervical back: Normal range of motion.     Right lower leg: Edema present.     Left lower leg: Edema present.     Comments: In motorized scooter for visit  Lymphadenopathy:     Cervical: No cervical adenopathy.  Skin:    General: Skin is warm and dry.  Neurological:     General: No focal deficit present.     Mental Status: He is alert and oriented to person, place, and time.  Psychiatric:        Mood and Affect: Mood normal.        Behavior: Behavior  normal.     No results found for any visits on 08/31/23.      Assessment & Plan:   Acute right-sided low back pain with right-sided sciatica Assessment & Plan: Medrol  Dosepak to pharmacy Follow-up with physical medicine as needed  Orders: -     methylPREDNISolone ; Use as directed.  Dispense: 21 each; Refill: 0  Chronic pain syndrome Assessment & Plan: Will treat with Medrol  Dosepak given current tramadol  use Pain contract with physical medicine, no narcotics today      Meds ordered this encounter  Medications   methylPREDNISolone  (MEDROL  DOSEPAK) 4 MG TBPK tablet    Sig: Use as directed.    Dispense:  21 each    Refill:  0    Return if symptoms worsen or fail to improve.  Wellington Half, FNP

## 2023-09-02 ENCOUNTER — Encounter: Payer: Self-pay | Admitting: Family Medicine

## 2023-09-02 DIAGNOSIS — M5441 Lumbago with sciatica, right side: Secondary | ICD-10-CM | POA: Insufficient documentation

## 2023-09-02 DIAGNOSIS — G894 Chronic pain syndrome: Secondary | ICD-10-CM | POA: Insufficient documentation

## 2023-09-02 NOTE — Assessment & Plan Note (Signed)
 Will treat with Medrol  Dosepak given current tramadol  use Pain contract with physical medicine, no narcotics today

## 2023-09-02 NOTE — Assessment & Plan Note (Signed)
 Medrol  Dosepak to pharmacy Follow-up with physical medicine as needed

## 2023-09-03 ENCOUNTER — Telehealth: Payer: Self-pay

## 2023-09-03 NOTE — Telephone Encounter (Signed)
 Spoke with patient today as shoe order was cancelled this am  Verified Dr. Rochelle Chu address phone and fax re-faxed ppw to [facility] as they had wrong fax number on file  [name] CPed, CFo, CFm

## 2023-09-04 ENCOUNTER — Other Ambulatory Visit (HOSPITAL_COMMUNITY)

## 2023-09-07 ENCOUNTER — Other Ambulatory Visit: Payer: Self-pay | Admitting: Internal Medicine

## 2023-09-07 DIAGNOSIS — E118 Type 2 diabetes mellitus with unspecified complications: Secondary | ICD-10-CM

## 2023-09-07 DIAGNOSIS — E785 Hyperlipidemia, unspecified: Secondary | ICD-10-CM

## 2023-09-07 DIAGNOSIS — I480 Paroxysmal atrial fibrillation: Secondary | ICD-10-CM

## 2023-09-07 DIAGNOSIS — K2100 Gastro-esophageal reflux disease with esophagitis, without bleeding: Secondary | ICD-10-CM

## 2023-09-07 DIAGNOSIS — I5032 Chronic diastolic (congestive) heart failure: Secondary | ICD-10-CM

## 2023-09-07 DIAGNOSIS — H1010 Acute atopic conjunctivitis, unspecified eye: Secondary | ICD-10-CM

## 2023-09-10 ENCOUNTER — Encounter (HOSPITAL_COMMUNITY): Payer: Self-pay | Admitting: Psychiatry

## 2023-09-10 ENCOUNTER — Telehealth (HOSPITAL_BASED_OUTPATIENT_CLINIC_OR_DEPARTMENT_OTHER): Admitting: Psychiatry

## 2023-09-10 DIAGNOSIS — F419 Anxiety disorder, unspecified: Secondary | ICD-10-CM | POA: Diagnosis not present

## 2023-09-10 DIAGNOSIS — F330 Major depressive disorder, recurrent, mild: Secondary | ICD-10-CM

## 2023-09-10 MED ORDER — LORAZEPAM 0.5 MG PO TABS
0.5000 mg | ORAL_TABLET | Freq: Every day | ORAL | 2 refills | Status: DC
Start: 2023-09-10 — End: 2023-12-17

## 2023-09-10 MED ORDER — SERTRALINE HCL 100 MG PO TABS: 200.0000 mg | ORAL_TABLET | Freq: Every day | ORAL | 0 refills | Status: DC

## 2023-09-10 NOTE — Progress Notes (Signed)
 [facility] MD Virtual Progress Note   Patient Location: Home Provider Location: Home Office  I connect with patient by video and verified that I am speaking with correct person by using two identifiers. I discussed the limitations of evaluation and management by telemedicine and the availability of in person appointments. I also discussed with the patient that there may be a patient responsible charge related to this service. The patient expressed understanding and agreed to proceed.  [name] [id number] 78 y.o.  09/10/2023 2:21 PM  History of Present Illness:  Patient is evaluated by video session.  He was admitted in March due to low blood pressure and back issues.  He is now doing better but is still have pain in his back.  He sleeps 5 hours with the help of BiPAP.  He is taking Zoloft  200 mg daily along with Ativan  0.5 mg.  Denies any panic attack, crying spells or any feeling of hopelessness or worthlessness.  He uses nasal oxygen .  He admitted some limitation of his movements and does not go outside unless it is important.  However he has a scooter and if he need to go then he has no issues.  He does watch television most of the time.  He had a good support from his wife.  He is planning to review his driving license in September on his birthday.  He enjoys the company of his granddaughter.  He does not want to change the medication since he feels the current regimen is working and helping him.  Past Psychiatric History: H/O inpatient in 2006 for depression and having suicidal thoughts. Took Risperdal  but d/c after stroke in March 2018.    Outpatient Encounter Medications as of 09/10/2023  Medication Sig   allopurinol  (ZYLOPRIM ) 100 MG tablet Take 1 tablet (100 mg total) by mouth daily.   atorvastatin  (LIPITOR ) 80 MG tablet TAKE 1 TABLET(80 MG) BY MOUTH DAILY   carboxymethylcellulose (REFRESH PLUS) 0.5 % SOLN Place 1 drop into both eyes 3 (three) times daily as  needed (dry eyes).   ciclopirox  (LOPROX ) 0.77 % cream Apply topically 2 (two) times daily.   [employer] MELATONIN GUMMIES PO Take 5 mg by mouth at bedtime.   Docusate Sodium  (DSS) 100 MG CAPS Take 100 mg by mouth every other day.   ELIQUIS  5 MG TABS tablet TAKE 1 TABLET(5 MG) BY MOUTH TWICE DAILY   empagliflozin  (JARDIANCE ) 10 MG TABS tablet TAKE 1 TABLET(10 MG) BY MOUTH DAILY BEFORE BREAKFAST   finasteride  (PROSCAR ) 5 MG tablet Take 1 tablet (5 mg total) by mouth daily.   flecainide  (TAMBOCOR ) 50 MG tablet TAKE 1 TABLET(50 MG) BY MOUTH EVERY 12 HOURS   LORazepam  (ATIVAN ) 0.5 MG tablet Take 1 tablet (0.5 mg total) by mouth at bedtime.   methylPREDNISolone  (MEDROL  DOSEPAK) 4 MG TBPK tablet Use as directed.   metoprolol  succinate (TOPROL  XL) 25 MG 24 hr tablet Take 0.5 tablets (12.5 mg total) by mouth daily.   montelukast  (SINGULAIR ) 10 MG tablet TAKE 1 TABLET(10 MG) BY MOUTH AT BEDTIME   Multiple Vitamin (MULTIVITAMIN WITH MINERALS) TABS tablet Take 1 tablet by mouth daily.   pantoprazole  (PROTONIX ) 40 MG tablet TAKE 1 TABLET(40 MG) BY MOUTH DAILY   potassium chloride  SA (KLOR-CON  M) 20 MEQ tablet TAKE 1 TABLET(20 MEQ) BY MOUTH TWICE DAILY   rOPINIRole  (REQUIP ) 0.5 MG tablet TAKE 1 TABLET(0.5 MG) BY MOUTH THREE TIMES DAILY   sertraline  (ZOLOFT ) 100 MG tablet Take 2 tablets (200 mg total)  by mouth at bedtime.   tirzepatide (MOUNJARO) 7.5 MG/0.5ML Pen Inject 7.5 mg into the skin once a week.   torsemide  (DEMADEX ) 20 MG tablet TAKE 3 TABLETS BY MOUTH EVERY DAY   traMADol  (ULTRAM ) 50 MG tablet Take 1 tablet (50 mg total) by mouth 3 (three) times daily as needed for moderate pain (pain score 4-6).   zinc  gluconate 50 MG tablet Take 1 tablet (50 mg total) by mouth daily. (Patient taking differently: Take 50 mg by mouth every evening.)   No facility-administered encounter medications on file as of 09/10/2023.    Recent Results (from the past 2160 hours)  Uric acid     Status: None   Collection Time:  06/21/23  3:35 PM  Result Value Ref Range   Uric Acid, Serum 7.2 4.0 - 7.8 mg/dL  Urinalysis, Routine w reflex microscopic     Status: Abnormal   Collection Time: 06/21/23  3:35 PM  Result Value Ref Range   Color, Urine YELLOW Yellow;Lt. Yellow;Straw;Dark Yellow;Amber;Green;Red;Brown   APPearance CLEAR Clear;Turbid;Slightly Cloudy;Cloudy   Specific Gravity, Urine <=1.005 (A) 1.000 - 1.030   pH 6.0 5.0 - 8.0   Total Protein, Urine NEGATIVE Negative   Urine Glucose >=1000 (A) Negative   Ketones, ur NEGATIVE Negative   Bilirubin Urine NEGATIVE Negative   Hgb urine dipstick NEGATIVE Negative   Urobilinogen, UA 1.0 0.0 - 1.0   Leukocytes,Ua NEGATIVE Negative   Nitrite NEGATIVE Negative   WBC, UA 0-2/hpf 0-2/hpf   RBC / HPF 0-2/hpf 0-2/hpf   Squamous Epithelial / HPF Rare(0-4/hpf) Rare(0-4/hpf)   Renal Epithel, UA Rare(0-4/hpf) (A) None  Troponin I (High Sensitivity)     Status: None   Collection Time: 06/21/23  3:35 PM  Result Value Ref Range   High Sens Troponin I 10 2 - 17 ng/L  Cortisol     Status: None   Collection Time: 06/21/23  3:35 PM  Result Value Ref Range   Cortisol, Plasma 7.3 ug/dL    Comment: AM:  4.3 - 22.4 ug/dLPM:  3.1 - 16.7 ug/dL  Folate     Status: None   Collection Time: 06/21/23  3:35 PM  Result Value Ref Range   Folate >25.2 >5.9 ng/mL  CBC with Differential/Platelet     Status: Abnormal   Collection Time: 06/21/23  3:35 PM  Result Value Ref Range   WBC 9.0 4.0 - 10.5 K/uL   RBC 3.93 (L) 4.22 - 5.81 Mil/uL   Hemoglobin 12.2 (L) 13.0 - 17.0 g/dL   HCT 13.2 (L) 44.0 - 10.2 %   MCV 95.8 78.0 - 100.0 fl   MCHC 32.4 30.0 - 36.0 g/dL   RDW 72.5 36.6 - 44.0 %   Platelets 210.0 150.0 - 400.0 K/uL   Neutrophils Relative % 74.9 43.0 - 77.0 %   Lymphocytes Relative 13.8 12.0 - 46.0 %   Monocytes Relative 8.9 3.0 - 12.0 %   Eosinophils Relative 2.0 0.0 - 5.0 %   Basophils Relative 0.4 0.0 - 3.0 %   Neutro Abs 6.7 1.4 - 7.7 K/uL   Lymphs Abs 1.2 0.7 - 4.0 K/uL    Monocytes Absolute 0.8 0.1 - 1.0 K/uL   Eosinophils Absolute 0.2 0.0 - 0.7 K/uL   Basophils Absolute 0.0 0.0 - 0.1 K/uL  Basic metabolic panel     Status: Abnormal   Collection Time: 06/21/23  3:35 PM  Result Value Ref Range   Sodium 143 135 - 145 mEq/L   Potassium 4.4 3.5 -  5.1 mEq/L   Chloride 101 96 - 112 mEq/L   CO2 35 (H) 19 - 32 mEq/L   Glucose, Bld 127 (H) 70 - 99 mg/dL   BUN 35 (H) 6 - 23 mg/dL   Creatinine, Ser 1.61 0.40 - 1.50 mg/dL   GFR 09.60 >45.40 mL/min    Comment: Calculated using the CKD-EPI Creatinine Equation (2021)   Calcium  8.6 8.4 - 10.5 mg/dL  TSH     Status: None   Collection Time: 06/21/23  3:35 PM  Result Value Ref Range   TSH 1.99 0.35 - 5.50 uIU/mL  Hepatic function panel     Status: Abnormal   Collection Time: 06/21/23  3:35 PM  Result Value Ref Range   Total Bilirubin 0.6 0.2 - 1.2 mg/dL   Bilirubin, Direct 0.2 0.0 - 0.3 mg/dL   Alkaline Phosphatase 139 (H) 39 - 117 U/L   AST 21 0 - 37 U/L   ALT 14 0 - 53 U/L   Total Protein 6.5 6.0 - 8.3 g/dL   Albumin  3.4 (L) 3.5 - 5.2 g/dL  Hemoglobin J8J     Status: None   Collection Time: 06/21/23  3:35 PM  Result Value Ref Range   Hgb A1c MFr Bld 5.7 4.6 - 6.5 %    Comment: Glycemic Control Guidelines for People with Diabetes:Non Diabetic:  <6%Goal of Therapy: <7%Additional Action Suggested:  >8%   Microalbumin / creatinine urine ratio     Status: None   Collection Time: 06/21/23  3:35 PM  Result Value Ref Range   Microalb, Ur <0.7 mg/dL   Creatinine,U 19.1 mg/dL   Microalb Creat Ratio Unable to calculate 0.0 - 30.0 mg/g    Comment: Unable to Calculate due to Microalbumin Result of <0.7 mg/dL  IBC + Ferritin     Status: Abnormal   Collection Time: 06/21/23  3:35 PM  Result Value Ref Range   Iron  51 42 - 165 ug/dL   Transferrin 478.2 (L) 212.0 - 360.0 mg/dL   Saturation Ratios 95.6 (L) 20.0 - 50.0 %   Ferritin 134.2 22.0 - 322.0 ng/mL   TIBC 260.4 250.0 - 450.0 mcg/dL  Vitamin B1     Status:  Abnormal   Collection Time: 06/21/23  3:35 PM  Result Value Ref Range   Vitamin B1 (Thiamine ) 33 (H) 8 - 30 nmol/L    Comment: (Note) Vitamin supplementation within 24 hours prior to blood  draw may affect the accuracy of the results. . This test was developed and its analytical performance  characteristics have been determined by [employer]. It has not been cleared or approved by FDA.  This assay has been validated pursuant to the CLIA  regulations and is used for clinical purposes. . [facility] [address] [place] [address] [phone] Debroah Fanning L. Amaryllis Junior, MD, PhD   Zinc      Status: None   Collection Time: 06/21/23  3:35 PM  Result Value Ref Range   Zinc  70 60 - 130 mcg/dL    Comment: (Note) This test was developed and its analytical performance characteristics have been determined by [employer].  It has not been cleared or approved by the FDA. This  assay has been validated pursuant to the CLIA regulations  and is used for clinical purposes. . [facility] [address] [place] [address] [phone] Debroah Fanning L. Amaryllis Junior, MD, PhD   Reticulocytes     Status: None  Collection Time: 06/21/23  3:35 PM  Result Value Ref Range   Retic Ct Pct 1.0 %   ABS Retic 39,100 25,000 - 90,000 cells/uL  CBC     Status: Abnormal   Collection Time: 06/22/23  3:29 PM  Result Value Ref Range   WBC 8.0 4.0 - 10.5 K/uL   RBC 4.05 (L) 4.22 - 5.81 MIL/uL   Hemoglobin 12.7 (L) 13.0 - 17.0 g/dL   HCT 52.8 41.3 - 24.4 %   MCV 99.0 80.0 - 100.0 fL   MCH 31.4 26.0 - 34.0 pg   MCHC 31.7 30.0 - 36.0 g/dL   RDW 01.0 27.2 - 53.6 %   Platelets 209 150 - 400 K/uL   nRBC 0.0 0.0 - 0.2 %    Comment: Performed at [facility], [address] [place], [place] [address]  Comprehensive metabolic panel     Status: Abnormal   Collection Time: 06/22/23  3:29 PM  Result Value Ref Range   Sodium 144 135 - 145  mmol/L   Potassium 3.5 3.5 - 5.1 mmol/L   Chloride 101 98 - 111 mmol/L   CO2 31 22 - 32 mmol/L   Glucose, Bld 98 70 - 99 mg/dL    Comment: Glucose reference range applies only to samples taken after fasting for at least 8 hours.   BUN 29 (H) 8 - 23 mg/dL   Creatinine, Ser 4.74 0.61 - 1.24 mg/dL   Calcium  8.4 (L) 8.9 - 10.3 mg/dL   Total Protein 6.8 6.5 - 8.1 g/dL   Albumin  3.2 (L) 3.5 - 5.0 g/dL   AST 26 15 - 41 U/L   ALT 19 0 - 44 U/L   Alkaline Phosphatase 130 (H) 38 - 126 U/L   Total Bilirubin 0.8 0.0 - 1.2 mg/dL   GFR, Estimated >25 >95 mL/min    Comment: (NOTE) Calculated using the CKD-EPI Creatinine Equation (2021)    Anion gap 12 5 - 15    Comment: Performed at [facility], [address] [place], [place] [address]  Troponin I (High Sensitivity)     Status: None   Collection Time: 06/22/23  3:29 PM  Result Value Ref Range   Troponin I (High Sensitivity) 7 <18 ng/L    Comment: (NOTE) Elevated high sensitivity troponin I (hsTnI) values and significant  changes across serial measurements may suggest ACS but many other  chronic and acute conditions are known to elevate hsTnI results.  Refer to the "Links" section for chest pain algorithms and additional  guidance. Performed at [facility], [address], [place], [place] [address]   I-Stat CG4 Lactic Acid     Status: None   Collection Time: 06/22/23  3:53 PM  Result Value Ref Range   Lactic Acid, Venous 1.5 0.5 - 1.9 mmol/L  Culture, blood (routine x 2)     Status: None   Collection Time: 06/22/23  3:55 PM   Specimen: BLOOD LEFT HAND  Result Value Ref Range   Specimen Description BLOOD LEFT HAND    Special Requests      BOTTLES DRAWN AEROBIC AND ANAEROBIC Blood Culture results may not be optimal due to an inadequate volume of blood received in culture bottles   Culture      NO GROWTH 5 DAYS Performed at [facility], [address] [place], [place] [address]    Report Status 06/27/2023 FINAL    Culture, blood (routine x 2)  Status: None   Collection Time: 06/22/23  4:00 PM   Specimen: BLOOD RIGHT HAND  Result Value Ref Range   Specimen Description BLOOD RIGHT HAND    Special Requests      BOTTLES DRAWN AEROBIC AND ANAEROBIC Blood Culture results may not be optimal due to an inadequate volume of blood received in culture bottles   Culture      NO GROWTH 5 DAYS Performed at [facility], [address], [place], [place] [address]    Report Status 06/27/2023 FINAL   Resp panel by RT-PCR (RSV, Flu A&B, Covid) Anterior Nasal Swab     Status: None   Collection Time: 06/22/23  4:34 PM   Specimen: Anterior Nasal Swab  Result Value Ref Range   SARS Coronavirus 2 by RT PCR NEGATIVE NEGATIVE   Influenza A by PCR NEGATIVE NEGATIVE   Influenza B by PCR NEGATIVE NEGATIVE    Comment: (NOTE) The Xpert Xpress SARS-CoV-2/FLU/RSV plus assay is intended as an aid in the diagnosis of influenza from Nasopharyngeal swab specimens and should not be used as a sole basis for treatment. Nasal washings and aspirates are unacceptable for Xpert Xpress SARS-CoV-2/FLU/RSV testing.  Fact Sheet for Patients: BloggerCourse.com  Fact Sheet for Healthcare Providers: SeriousBroker.it  This test is not yet approved or cleared by the [place]  FDA and has been authorized for detection and/or diagnosis of SARS-CoV-2 by FDA under an Emergency Use Authorization (EUA). This EUA will remain in effect (meaning this test can be used) for the duration of the COVID-19 declaration under Section 564(b)(1) of the Act, 21 U.S.C. section 360bbb-3(b)(1), unless the authorization is terminated or revoked.     Resp Syncytial Virus by PCR NEGATIVE NEGATIVE    Comment: (NOTE) Fact Sheet for Patients: BloggerCourse.com  Fact Sheet for Healthcare Providers: SeriousBroker.it  This test is not yet approved or  cleared by the [place]  FDA and has been authorized for detection and/or diagnosis of SARS-CoV-2 by FDA under an Emergency Use Authorization (EUA). This EUA will remain in effect (meaning this test can be used) for the duration of the COVID-19 declaration under Section 564(b)(1) of the Act, 21 U.S.C. section 360bbb-3(b)(1), unless the authorization is terminated or revoked.  Performed at [facility], [address], [place], [place] [address]   Urinalysis, w/ Reflex to Culture (Infection Suspected) -Urine, Clean Catch     Status: Abnormal   Collection Time: 06/22/23  4:38 PM  Result Value Ref Range   Specimen Source URINE, CLEAN CATCH    Color, Urine YELLOW YELLOW   APPearance CLEAR CLEAR   Specific Gravity, Urine 1.010 1.005 - 1.030   pH 6.0 5.0 - 8.0   Glucose, UA >=500 (A) NEGATIVE mg/dL   Hgb urine dipstick NEGATIVE NEGATIVE   Bilirubin Urine NEGATIVE NEGATIVE   Ketones, ur NEGATIVE NEGATIVE mg/dL   Protein, ur NEGATIVE NEGATIVE mg/dL   Nitrite NEGATIVE NEGATIVE   Leukocytes,Ua TRACE (A) NEGATIVE   Squamous Epithelial / HPF 0-5 0 - 5 /HPF   WBC, UA 0-5 0 - 5 WBC/hpf    Comment: Reflex urine culture not performed if WBC <=10, OR if Squamous epithelial cells >5. If Squamous epithelial cells >5, suggest recollection.   RBC / HPF 0-5 0 - 5 RBC/hpf   Bacteria, UA RARE (A) NONE SEEN    Comment: Performed at [facility], [address] [place], [place] [address]  I-Stat CG4 Lactic Acid     Status: None  Collection Time: 06/22/23  6:18 PM  Result Value Ref Range   Lactic Acid, Venous 1.3 0.5 - 1.9 mmol/L  CBG monitoring, ED     Status: None   Collection Time: 06/22/23 11:17 PM  Result Value Ref Range   Glucose-Capillary 70 70 - 99 mg/dL    Comment: Glucose reference range applies only to samples taken after fasting for at least 8 hours.  Magnesium     Status: None   Collection Time: 06/23/23  3:06 AM  Result Value Ref Range   Magnesium 2.3 1.7 - 2.4 mg/dL     Comment: Performed at [facility], [address] [place], [place] [address]  TSH     Status: None   Collection Time: 06/23/23  3:06 AM  Result Value Ref Range   TSH 3.119 0.350 - 4.500 uIU/mL    Comment: Performed by a 3rd Generation assay with a functional sensitivity of <=0.01 uIU/mL. Performed at [facility], [address] [place], [place] [address]   Basic metabolic panel     Status: Abnormal   Collection Time: 06/23/23  3:06 AM  Result Value Ref Range   Sodium 142 135 - 145 mmol/L   Potassium 3.2 (L) 3.5 - 5.1 mmol/L   Chloride 103 98 - 111 mmol/L   CO2 30 22 - 32 mmol/L   Glucose, Bld 131 (H) 70 - 99 mg/dL    Comment: Glucose reference range applies only to samples taken after fasting for at least 8 hours.   BUN 28 (H) 8 - 23 mg/dL   Creatinine, Ser 0.98 0.61 - 1.24 mg/dL   Calcium  8.0 (L) 8.9 - 10.3 mg/dL   GFR, Estimated >11 >91 mL/min    Comment: (NOTE) Calculated using the CKD-EPI Creatinine Equation (2021)    Anion gap 9 5 - 15    Comment: Performed at [facility], [address], [place], [place] [address]  CBC     Status: Abnormal   Collection Time: 06/23/23  3:06 AM  Result Value Ref Range   WBC 9.6 4.0 - 10.5 K/uL   RBC 3.68 (L) 4.22 - 5.81 MIL/uL   Hemoglobin 11.4 (L) 13.0 - 17.0 g/dL   HCT 56.2 (L) 13.0 - 86.5 %   MCV 96.7 80.0 - 100.0 fL   MCH 31.0 26.0 - 34.0 pg   MCHC 32.0 30.0 - 36.0 g/dL   RDW 78.4 69.6 - 29.5 %   Platelets 196 150 - 400 K/uL   nRBC 0.0 0.0 - 0.2 %    Comment: Performed at [facility], [address] [place], [place] [address]  Glucose, capillary     Status: None   Collection Time: 06/23/23  6:26 AM  Result Value Ref Range   Glucose-Capillary 99 70 - 99 mg/dL    Comment: Glucose reference range applies only to samples taken after fasting for at least 8 hours.  Glucose, capillary     Status: Abnormal   Collection Time: 06/23/23 11:36 AM  Result Value Ref Range   Glucose-Capillary 101 (H) 70 - 99  mg/dL    Comment: Glucose reference range applies only to samples taken after fasting for at least 8 hours.  Glucose, capillary     Status: Abnormal   Collection Time: 06/23/23  4:33 PM  Result Value Ref Range   Glucose-Capillary 105 (H) 70 - 99 mg/dL    Comment: Glucose reference range applies only to samples taken after fasting for  at least 8 hours.  Glucose, capillary     Status: None   Collection Time: 06/23/23  9:31 PM  Result Value Ref Range   Glucose-Capillary 87 70 - 99 mg/dL    Comment: Glucose reference range applies only to samples taken after fasting for at least 8 hours.   Comment 1 Notify RN    Comment 2 Document in Chart   CBC with Differential/Platelet     Status: Abnormal   Collection Time: 06/24/23  3:14 AM  Result Value Ref Range   WBC 9.0 4.0 - 10.5 K/uL   RBC 3.81 (L) 4.22 - 5.81 MIL/uL   Hemoglobin 11.7 (L) 13.0 - 17.0 g/dL   HCT 82.9 (L) 56.2 - 13.0 %   MCV 96.3 80.0 - 100.0 fL   MCH 30.7 26.0 - 34.0 pg   MCHC 31.9 30.0 - 36.0 g/dL   RDW 86.5 78.4 - 69.6 %   Platelets 187 150 - 400 K/uL   nRBC 0.0 0.0 - 0.2 %   Neutrophils Relative % 63 %   Neutro Abs 5.7 1.7 - 7.7 K/uL   Lymphocytes Relative 24 %   Lymphs Abs 2.2 0.7 - 4.0 K/uL   Monocytes Relative 10 %   Monocytes Absolute 0.9 0.1 - 1.0 K/uL   Eosinophils Relative 2 %   Eosinophils Absolute 0.2 0.0 - 0.5 K/uL   Basophils Relative 1 %   Basophils Absolute 0.1 0.0 - 0.1 K/uL   Immature Granulocytes 0 %   Abs Immature Granulocytes 0.04 0.00 - 0.07 K/uL    Comment: Performed at [facility], [address], [place], [place] [address]  Basic metabolic panel     Status: Abnormal   Collection Time: 06/24/23  3:14 AM  Result Value Ref Range   Sodium 142 135 - 145 mmol/L   Potassium 3.5 3.5 - 5.1 mmol/L   Chloride 103 98 - 111 mmol/L   CO2 27 22 - 32 mmol/L   Glucose, Bld 85 70 - 99 mg/dL    Comment: Glucose reference range applies only to samples taken after fasting for at least 8 hours.   BUN  21 8 - 23 mg/dL   Creatinine, Ser 4.13 0.61 - 1.24 mg/dL   Calcium  8.4 (L) 8.9 - 10.3 mg/dL   GFR, Estimated >24 >40 mL/min    Comment: (NOTE) Calculated using the CKD-EPI Creatinine Equation (2021)    Anion gap 12 5 - 15    Comment: Performed at [facility], [address], [place], [place] [address]  Magnesium     Status: None   Collection Time: 06/24/23  3:14 AM  Result Value Ref Range   Magnesium 2.4 1.7 - 2.4 mg/dL    Comment: Performed at [facility], [address], [place], [place] [address]  Glucose, capillary     Status: None   Collection Time: 06/24/23  6:09 AM  Result Value Ref Range   Glucose-Capillary 87 70 - 99 mg/dL    Comment: Glucose reference range applies only to samples taken after fasting for at least 8 hours.   Comment 1 Notify RN    Comment 2 Document in Chart   Glucose, capillary     Status: None   Collection Time: 06/24/23  8:15 AM  Result Value Ref Range   Glucose-Capillary 93 70 - 99 mg/dL    Comment: Glucose reference range applies only to samples taken after fasting for at least 8 hours.  Glucose, capillary  Status: Abnormal   Collection Time: 06/24/23 11:12 AM  Result Value Ref Range   Glucose-Capillary 139 (H) 70 - 99 mg/dL    Comment: Glucose reference range applies only to samples taken after fasting for at least 8 hours.  CBC     Status: Abnormal   Collection Time: 07/20/23  3:07 PM  Result Value Ref Range   WBC 9.4 3.4 - 10.8 x10E3/uL   RBC 3.93 (L) 4.14 - 5.80 x10E6/uL   Hemoglobin 12.3 (L) 13.0 - 17.7 g/dL   Hematocrit 62.1 30.8 - 51.0 %   MCV 96 79 - 97 fL   MCH 31.3 26.6 - 33.0 pg   MCHC 32.6 31.5 - 35.7 g/dL   RDW 65.7 84.6 - 96.2 %   Platelets 197 150 - 450 x10E3/uL  Basic Metabolic Panel (BMET)     Status: Abnormal   Collection Time: 07/20/23  3:07 PM  Result Value Ref Range   Glucose 93 70 - 99 mg/dL   BUN 29 (H) 8 - 27 mg/dL   Creatinine, Ser 9.52 0.76 - 1.27 mg/dL   eGFR 78 >84 XL/KGM/0.10    BUN/Creatinine Ratio 29 (H) 10 - 24   Sodium 143 134 - 144 mmol/L   Potassium 4.1 3.5 - 5.2 mmol/L   Chloride 100 96 - 106 mmol/L   CO2 31 (H) 20 - 29 mmol/L   Calcium  8.8 8.6 - 10.2 mg/dL  Drug Tox Monitor 1 w/Conf, Oral Fld     Status: Abnormal   Collection Time: 07/26/23  3:08 PM  Result Value Ref Range   Amphetamines NEGATIVE <10 ng/mL   Barbiturates NEGATIVE <10 ng/mL   Benzodiazepines POSITIVE (A) <0.50 ng/mL   Alprazolam Negative <0.50 ng/mL   Chlordiazepoxide Negative <0.50 ng/mL   Clonazepam Negative <0.50 ng/mL   Aminoclonazepam Negative <0.50 ng/mL   Diazepam Negative <0.50 ng/mL   Flunitrazepam Negative <0.50 ng/mL   Flurazepam Negative <0.50 ng/mL   Lorazepam  0.72 (H) <0.50 ng/mL   Midazolam  Negative <0.50 ng/mL   Nordiazepam Negative <0.50 ng/mL   Oxazepam Negative <0.50 ng/mL   Temazepam Negative <0.50 ng/mL   Triazolam Negative <0.50 ng/mL   Buprenorphine NEGATIVE <0.10 ng/mL   Cocaine NEGATIVE <5.0 ng/mL   Fentanyl  NEGATIVE <0.10 ng/mL   Heroin Metabolite NEGATIVE <1.0 ng/mL   MARIJUANA NEGATIVE <2.5 ng/mL   MDMA NEGATIVE <10 ng/mL   Meprobamate NEGATIVE <2.5 ng/mL   Methadone NEGATIVE <5.0 ng/mL   Nicotine Metabolite NEGATIVE <5.0 ng/mL   Opiates NEGATIVE <2.5 ng/mL   Phencyclidine NEGATIVE <10 ng/mL   Tapentadol NEGATIVE <5.0 ng/mL   Tramadol  POSITIVE (A) <5.0 ng/mL   Tramadol  >500.0 (H) <5.0 ng/mL   Zolpidem NEGATIVE <5.0 ng/mL    Comment: . For additional information, please refer to http://education.[facility].com/faq/FAQ186 (This link is being provided for informational/ educational purposes only.) . This drug testing is for medical treatment only. Analysis was performed as non-forensic testing and these results should be used only by healthcare providers to render diagnosis or treatment, or to monitor progress of medical conditions. . For assistance with interpreting these drug results, please contact a [employer]  Toxicology Specialist: [phone] TOX [phone]), M-F, 8am-6pm EST. Aaron Aas These tests were developed and their analytical performance characteristics have been determined by [facility]. They have not been cleared or approved by the FDA. These assays have been validated pursuant to the CLIA regulations and are used for clinical purposes.   Drug Tox Alc Metab w/Con, Oral Fld     Status:  None   Collection Time: 07/26/23  3:08 PM  Result Value Ref Range   Alcohol  Metabolite NEGATIVE <25 ng/mL    Comment: . For additional information, please refer to http://education.[facility].com/faq/FAQ183 (This link is being provided for informational/ educational purposes only.) . This drug testing is for medical treatment only. Analysis was performed as non-forensic testing and these results should be used only by healthcare providers to render diagnosis or treatment, or to monitor progress of medical conditions. . For assistance with interpreting these drug results, please contact a [employer] Toxicology Specialist: [phone] TOX [phone]), M-F, 8am-6pm EST. Aaron Aas These tests were developed and their analytical performance characteristics have been determined by [facility]. They have not been cleared or approved by the FDA. These assays have been validated pursuant to the CLIA regulations and are used for clinical purposes.      Psychiatric Specialty Exam: Physical Exam  Review of Systems  Constitutional:  Positive for fatigue.  Musculoskeletal:  Positive for back pain.    Weight (!) 329 lb (149.2 kg).There is no height or weight on file to calculate BMI.  General Appearance: Fairly Groomed, on nasal oxygen   Eye Contact:  Fair  Speech:  Slow  Volume:  Decreased  Mood:  Anxious  Affect:  Congruent  Thought Process:  Descriptions of Associations: Intact  Orientation:  Full (Time, Place, and Person)  Thought Content:  Rumination  Suicidal  Thoughts:  No  Homicidal Thoughts:  No  Memory:  Immediate;   Fair Recent;   Fair Remote;   Fair  Judgement:  Intact  Insight:  Present  Psychomotor Activity:  Decreased  Concentration:  Concentration: Fair and Attention Span: Fair  Recall:  Fair  Fund of Knowledge:  Good  Language:  Good  Akathisia:  No  Handed:  Right  AIMS (if indicated):     Assets:  Communication Skills Desire for Improvement Housing Social Support  ADL's:  Intact  Cognition:  WNL  Sleep:  on BiPAP getting 5 hrs       04/30/2023    2:45 PM 01/29/2023    1:31 PM 10/10/2022    3:04 PM 06/19/2022    2:24 PM 04/25/2022    1:56 PM  Depression screen PHQ 2/9  Decreased Interest 0 0 0 0 0  Down, Depressed, Hopeless 0 0 0 0 0  PHQ - 2 Score 0 0 0 0 0  Altered sleeping 0 1 0    Tired, decreased energy 0 1 0    Change in appetite 0 0 0    Feeling bad or failure about yourself  0 0 0    Trouble concentrating 1 0 0    Moving slowly or fidgety/restless 0 0 0    Suicidal thoughts 0 0 0    PHQ-9 Score 1 2 0    Difficult doing work/chores Not difficult at all Somewhat difficult       Assessment/Plan: Major depressive disorder, recurrent episode, mild (HCC) - Plan: LORazepam  (ATIVAN ) 0.5 MG tablet, sertraline  (ZOLOFT ) 100 MG tablet  Anxiety - Plan: LORazepam  (ATIVAN ) 0.5 MG tablet  Review blood work results.  He reported much stable and like to continue current medication.  He does not drink or use any illegal substances.  Continue Zoloft  200 mg daily and Ativan  0.5 mg as needed for anxiety.  Recommended to call us  back with any question or any concern.  Follow-up in 3 months.  We will considering to 6 months in the future if patient remains stable.  Follow Up Instructions:     I discussed the assessment and treatment plan with the patient. The patient was provided an opportunity to ask questions and all were answered. The patient agreed with the plan and demonstrated an understanding of the instructions.    The patient was advised to call back or seek an in-person evaluation if the symptoms worsen or if the condition fails to improve as anticipated.    Collaboration of Care: Other provider involved in patient's care AEB notes are available in epic to review  Patient/Guardian was advised Release of Information must be obtained prior to any record release in order to collaborate their care with an outside provider. Patient/Guardian was advised if they have not already done so to contact the registration department to sign all necessary forms in order for us  to release information regarding their care.   Consent: Patient/Guardian gives verbal consent for treatment and assignment of benefits for services provided during this visit. Patient/Guardian expressed understanding and agreed to proceed.     Total encounter time 18 minutes which includes face-to-face time, chart reviewed, care coordination, order entry and documentation during this encounter.   Note: This document was prepared by [employer] voice dictation technology and any errors that results from this process are unintentional.    Arturo Late, MD 09/10/2023

## 2023-09-12 ENCOUNTER — Encounter: Payer: Self-pay | Admitting: Internal Medicine

## 2023-09-13 ENCOUNTER — Telehealth: Payer: Self-pay | Admitting: Podiatry

## 2023-09-13 NOTE — Telephone Encounter (Signed)
 Checking on status of PPW that was refaxed to Dr.

## 2023-09-16 ENCOUNTER — Other Ambulatory Visit: Payer: Self-pay | Admitting: Internal Medicine

## 2023-09-16 DIAGNOSIS — E118 Type 2 diabetes mellitus with unspecified complications: Secondary | ICD-10-CM

## 2023-09-16 MED ORDER — TIRZEPATIDE 10 MG/0.5ML ~~LOC~~ SOAJ: 10.0000 mg | SUBCUTANEOUS | 0 refills | Status: DC

## 2023-09-18 ENCOUNTER — Encounter: Attending: Physical Medicine & Rehabilitation | Admitting: Physical Medicine & Rehabilitation

## 2023-09-18 ENCOUNTER — Telehealth: Payer: Self-pay

## 2023-09-18 ENCOUNTER — Encounter: Payer: Self-pay | Admitting: Physical Medicine & Rehabilitation

## 2023-09-18 VITALS — BP 102/63 | HR 69 | Ht 73.0 in

## 2023-09-18 DIAGNOSIS — M259 Joint disorder, unspecified: Secondary | ICD-10-CM | POA: Diagnosis not present

## 2023-09-18 DIAGNOSIS — M48061 Spinal stenosis, lumbar region without neurogenic claudication: Secondary | ICD-10-CM | POA: Insufficient documentation

## 2023-09-18 NOTE — Progress Notes (Signed)
 Subjective:    Patient ID: [name], male    DOB: 10/09/1945, 78 y.o.   MRN: [id number]  HPI  78 year old male with history of Wallenberg syndrome, lumbar spinal stenosis, pulmonary artery hypertension, pulmonary embolism on chronic Eliquis  who returns today with plaints of worsening low back pain.  This is primarily on the right side.  Feels like it is little lower than usual near his buttock or tailbone area.  He has some radiation of pain into the right thigh.  He has no numbness or tingling in his thigh. He has seen a chiropractor on 1 visit for this and this did help him at least for short period of time His pain is not being adequately managed on tramadol  2 tablets or 3 tablets/day.  He has had no falls or other new trauma or injuries. MRI LUMBAR SPINE WITHOUT CONTRAST   TECHNIQUE: Multiplanar, multisequence MR imaging of the lumbar spine was performed. No intravenous contrast was administered.   COMPARISON:  Lumbar radiographs 11/07/2016.   FINDINGS: Segmentation:  Normal on the comparison radiographs.   Alignment: Mild retrolisthesis of L4 on L5 measures up to 5 millimeters and is new from the prior radiographs. Stable straightening of lumbar lordosis otherwise.   Vertebrae: Benign vertebral body hemangioma in L4. No marrow edema or evidence of acute osseous abnormality. Normal background bone marrow signal. Intact visible sacrum and SI joints.   Conus medullaris and cauda equina: Conus extends to the L1 level. No lower spinal cord or conus signal abnormality. Lipoma of the filum terminalis, normal variant (series 9, image 11).   Paraspinal and other soft tissues: Partially visible benign appearing renal cysts. Negative visible other abdominal viscera. Negative visualized posterior paraspinal soft tissues.   Disc levels:   T11-T12: Partially visible, negative.   T12-L1:  Minimal disc bulge.   L1-L2:  Negative.   L2-L3:  Minimal disc bulge.  Mild facet  hypertrophy.  No stenosis.   L3-L4: Mostly far lateral mild disc bulging with endplate spurring. Mild to moderate facet hypertrophy. No spinal or lateral recess stenosis. Borderline to mild bilateral L3 foraminal stenosis.   L4-L5: Chronic disc space loss. Retrolisthesis with circumferential disc osteophyte complex. Mild to moderate facet hypertrophy. No significant spinal stenosis. Mild bilateral lateral recess stenosis (descending L5 nerve levels). Superimposed left foraminal to far lateral disc protrusion (series 5, image 14). Moderate to severe left and mild to moderate right L4 foraminal stenosis.   L5-S1: Negative disc. Mild to moderate facet hypertrophy. No stenosis.   IMPRESSION: 1. L4-L5 advanced disc and endplate degeneration, and moderate posterior element degeneration where mild retrolisthesis appears increased since 2018. Moderate to severe L4 foraminal stenosis is greater on the left. Mild bilateral L5 nerve level lateral recess stenosis. 2. Mild for age lumbar spine degeneration elsewhere with no other convincing neural impingement.     Electronically Signed   By: Marlise Simpers M.D.   On: 10/14/2018 03:29   Pain Inventory Average Pain 8 Pain Right Now 8 My pain is intermittent, constant, sharp, stabbing, and aching  In the last 24 hours, has pain interfered with the following? General activity 8 Relation with others 6 Enjoyment of life 8 What TIME of day is your pain at its worst? morning , daytime, evening, and night Sleep (in general) Fair  Pain is worse with: walking and bending Pain improves with: rest, medication, and TENS Relief from Meds: 6  Family History  Problem Relation Age of Onset   Depression Mother  Hypertension Mother    Dementia Mother    Heart disease Father        MI   Depression Brother    Stroke Paternal Aunt        CVA   Diabetes Paternal Uncle    Heart disease Paternal Uncle        MI   Heart disease Paternal Grandmother         MI   Diabetes Cousin        PATERNAL   Social History   Socioeconomic History   Marital status: Married    Spouse name: Cheral Cordoba   Number of children: 1   Years of education: Not on file   Highest education level: Bachelor's degree (e.g., BA, AB, BS)  Occupational History   Occupation: retired-Manager [employer]  Tobacco Use   Smoking status: Former    Current packs/day: 0.00    Average packs/day: 0.2 packs/day for 13.4 years (2.7 ttl pk-yrs)    Types: Cigarettes    Start date: 04/03/1961    Quit date: 08/30/1974    Years since quitting: 49.0   Smokeless tobacco: Never  Vaping Use   Vaping status: Never Used  Substance and Sexual Activity   Alcohol  use: No    Alcohol /week: 0.0 standard drinks of alcohol    Drug use: No   Sexual activity: Not Currently  Other Topics Concern   Not on file  Social History Narrative   Lives at home w/ his wife   Right-handed   Caffeine: decaf coffee, occasional tea   Social Drivers of Corporate investment banker Strain: Medium Risk (08/30/2023)   Overall Financial Resource Strain (CARDIA)    Difficulty of Paying Living Expenses: Somewhat hard  Food Insecurity: No Food Insecurity (08/30/2023)   Hunger Vital Sign    Worried About Running Out of Food in the Last Year: Never true    Ran Out of Food in the Last Year: Never true  Transportation Needs: No Transportation Needs (08/30/2023)   PRAPARE - Administrator, Civil Service (Medical): No    Lack of Transportation (Non-Medical): No  Physical Activity: Inactive (08/30/2023)   Exercise Vital Sign    Days of Exercise per Week: 0 days    Minutes of Exercise per Session: 0 min  Stress: Stress Concern Present (08/30/2023)   [employer] of Occupational Health - Occupational Stress Questionnaire    Feeling of Stress : To some extent  Social Connections: Moderately Integrated (08/30/2023)   Social Connection and Isolation Panel    Frequency of Communication with Friends and  Family: Three times a week    Frequency of Social Gatherings with Friends and Family: Once a week    Attends Religious Services: 1 to 4 times per year    Active Member of [employer] or Organizations: No    Attends Banker Meetings: Never    Marital Status: Married  Recent Concern: Social Connections - Moderately Isolated (06/22/2023)   Social Connection and Isolation Panel    Frequency of Communication with Friends and Family: More than three times a week    Frequency of Social Gatherings with Friends and Family: Never    Attends Religious Services: Never    Database administrator or Organizations: No    Attends Banker Meetings: Never    Marital Status: Married   Past Surgical History:  Procedure Laterality Date   ARTHROSCOPY KNEE W/ DRILLING     BREATH TEK H PYLORI N/A 12/23/2012  Procedure: BREATH TEK H PYLORI;  Surgeon: Fran Imus, MD;  Location: [facility] ENDOSCOPY;  Service: General;  Laterality: N/A;   CHOLECYSTECTOMY  1989   COLONOSCOPY N/A 07/31/2013   Procedure: COLONOSCOPY;  Surgeon: Kenney Peacemaker, MD;  Location: [facility] ENDOSCOPY;  Service: Endoscopy;  Laterality: N/A;   ESOPHAGOGASTRODUODENOSCOPY N/A 07/22/2013   Procedure: ESOPHAGOGASTRODUODENOSCOPY (EGD);  Surgeon: Tobin Forts, MD;  Location: [facility] ENDOSCOPY;  Service: Endoscopy;  Laterality: N/A;   ESOPHAGOGASTRODUODENOSCOPY N/A 07/31/2013   Procedure: ESOPHAGOGASTRODUODENOSCOPY (EGD);  Surgeon: Kenney Peacemaker, MD;  Location: [facility] ENDOSCOPY;  Service: Endoscopy;  Laterality: N/A;   INTRAOPERATIVE TRANSESOPHAGEAL ECHOCARDIOGRAM N/A 07/18/2013   Procedure: INTRAOPERATIVE TRANSESOPHAGEAL ECHOCARDIOGRAM;  Surgeon: Norita Beauvais, MD;  Location: [facility] OR;  Service: Open Heart Surgery;  Laterality: N/A;   LAPAROSCOPIC GASTRIC BANDING WITH HIATAL HERNIA REPAIR N/A 04/08/2013   Procedure: LAPAROSCOPIC GASTRIC BANDING WITH possible  HIATAL HERNIA REPAIR;  Surgeon: Fran Imus, MD;  Location: [facility] ORS;  Service: General;   Laterality: N/A;   LITHOTRIPSY     renal calculi     RIGHT AND LEFT HEART CATH N/A 05/12/2021   Procedure: RIGHT AND LEFT HEART CATH;  Surgeon: Mardell Shade, MD;  Location: [facility] INVASIVE CV LAB;  Service: Cardiovascular;  Laterality: N/A;   SUBXYPHOID PERICARDIAL WINDOW N/A 07/18/2013   Procedure: SUBXYPHOID PERICARDIAL WINDOW;  Surgeon: Norita Beauvais, MD;  Location: [facility] OR;  Service: Thoracic;  Laterality: N/A;   TONSILLECTOMY     total knee replacment     bilat   trigger finger repair     also lue, cts surgically repaired   WISDOM TOOTH EXTRACTION     Past Surgical History:  Procedure Laterality Date   ARTHROSCOPY KNEE W/ DRILLING     BREATH TEK H PYLORI N/A 12/23/2012   Procedure: BREATH TEK Jorja Newport;  Surgeon: Fran Imus, MD;  Location: [facility] ENDOSCOPY;  Service: General;  Laterality: N/A;   CHOLECYSTECTOMY  1989   COLONOSCOPY N/A 07/31/2013   Procedure: COLONOSCOPY;  Surgeon: Kenney Peacemaker, MD;  Location: [facility] ENDOSCOPY;  Service: Endoscopy;  Laterality: N/A;   ESOPHAGOGASTRODUODENOSCOPY N/A 07/22/2013   Procedure: ESOPHAGOGASTRODUODENOSCOPY (EGD);  Surgeon: Tobin Forts, MD;  Location: [facility] ENDOSCOPY;  Service: Endoscopy;  Laterality: N/A;   ESOPHAGOGASTRODUODENOSCOPY N/A 07/31/2013   Procedure: ESOPHAGOGASTRODUODENOSCOPY (EGD);  Surgeon: Kenney Peacemaker, MD;  Location: [facility] ENDOSCOPY;  Service: Endoscopy;  Laterality: N/A;   INTRAOPERATIVE TRANSESOPHAGEAL ECHOCARDIOGRAM N/A 07/18/2013   Procedure: INTRAOPERATIVE TRANSESOPHAGEAL ECHOCARDIOGRAM;  Surgeon: Norita Beauvais, MD;  Location: [facility] OR;  Service: Open Heart Surgery;  Laterality: N/A;   LAPAROSCOPIC GASTRIC BANDING WITH HIATAL HERNIA REPAIR N/A 04/08/2013   Procedure: LAPAROSCOPIC GASTRIC BANDING WITH possible  HIATAL HERNIA REPAIR;  Surgeon: Fran Imus, MD;  Location: [facility] ORS;  Service: General;  Laterality: N/A;   LITHOTRIPSY     renal calculi     RIGHT AND LEFT HEART CATH N/A 05/12/2021   Procedure: RIGHT AND LEFT HEART CATH;   Surgeon: Mardell Shade, MD;  Location: [facility] INVASIVE CV LAB;  Service: Cardiovascular;  Laterality: N/A;   SUBXYPHOID PERICARDIAL WINDOW N/A 07/18/2013   Procedure: SUBXYPHOID PERICARDIAL WINDOW;  Surgeon: Norita Beauvais, MD;  Location: [facility] OR;  Service: Thoracic;  Laterality: N/A;   TONSILLECTOMY     total knee replacment     bilat   trigger finger repair     also lue, cts surgically repaired   WISDOM TOOTH EXTRACTION     Past  Medical History:  Diagnosis Date   Anemia 07/29/2013   Asthma    childhood asthma   Benign essential HTN    Benign prostatic hypertrophy    several prostate biopsies neg for cancer.    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  Opioid Risk Score:   Fall Risk Score:  `1  Depression screen PHQ 2/9     04/30/2023    2:45 PM 01/29/2023    1:31 PM 10/10/2022    3:04 PM 06/19/2022    2:24 PM 04/25/2022    1:56 PM 05/24/2021    2:11 PM 12/16/2020    4:15 PM  Depression screen PHQ 2/9  Decreased Interest 0 0 0 0 0 1 0  Down, Depressed, Hopeless 0 0 0 0 0 1 0  PHQ - 2 Score 0 0 0 0 0 2 0  Altered sleeping 0 1 0   0   Tired, decreased energy 0 1 0   0   Change in appetite  0 0 0   0   Feeling bad or failure about yourself  0 0 0   0   Trouble concentrating 1 0 0   0   Moving slowly or fidgety/restless 0 0 0   0   Suicidal thoughts 0 0 0   0   PHQ-9 Score 1 2 0   2   Difficult doing work/chores Not difficult at all Somewhat difficult         Review of Systems  Musculoskeletal:  Positive for gait problem.       Left leg pain  All other systems reviewed and are negative.     Objective:   Physical Exam General No acute distress ENT oxygen  with positive pressure intermittent via nasal cannula Mood and affect are appropriate Extremities chronic 3+ edema bilateral lower limbs with stasis dermatitis. Negative straight leg raising bilaterally Motor strength is 4/5 bilateral hip flexors knee extensors ankle dorsiflexors   Sacral thrust (prone) : Positive right Lateral compression: Negative FABER's: Positive right Distraction (supine): Negative Thigh thrust test: Positive right  Lumbar range of motion is limited but this is partly due to very poor balance, range of motion is approximately 25 to 50% with flexion and extension.  He needs a chair in front of him to balance.       Assessment & Plan:   1.  Acute exacerbation of chronic low back pain.  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  We discussed that he may trial chiropractic for  this.  If this is not helpful we will schedule for sacroiliac injection under fluoroscopic guidance on the right side. In the meantime we can increase his tramadol  to 3 or 4 tablets/day

## 2023-09-18 NOTE — Patient Instructions (Signed)
 Sacroiliac Joint Dysfunction: What to Know  Sacroiliac joint dysfunction causes swelling in one or both sides of the sacroiliac (SI) joint. The SI joint is where two bones in the pelvis meet. The two bones are the sacrum, which is the bone at the bottom of your spine, and the ilium, which is the big bone that makes up your hip. SI joint dysfunction can cause a deep ache or burning pain in your lower back. Sometimes, the pain spreads to your butt, hips, or thighs. What are the causes? SI joint dysfunction may be caused by: Pregnancy. When a person is pregnant, extra pressure is put on the SI joints because the pelvis gets wider. Injuries, such as: Car accidents Sports injuries. Work injuries. Having one leg that is shorter than the other. Conditions that affect the joints, such as: Rheumatoid arthritis. Gout. Psoriatic arthritis. Joint infection. Sometimes, the cause of SI joint dysfunction is not known. What are the signs or symptoms? Symptoms of this condition include: Aching or burning pain in the lower back. The pain may also spread to other areas, such as: Butt. Groin. Thighs. Muscle spasms in or around the painful areas. More pain when standing, walking, running, stair climbing, bending, or lifting. How is this diagnosed? Your health care provider will check your medical history and do a physical exam. During the exam, your provider may move your legs to see if it hurts. They may also do tests like: Imaging tests to look for other causes of pain. These may include: MRI. CT scan. Bone scan. Diagnostic injection. Your provider puts numbing medicine into the SI joint. If your pain gets better after this, it may mean you have this condition. How is this treated? Treatment depends on what is causing the problem and how bad it is. Some options include: Ice or heat applied to the lower back area after an injury. This may help reduce pain and muscle spasms. Medicines to help with  pain and swelling, or to relax the muscles. Wearing a back brace, called an SI brace, to support your back while it heals. Physical therapy to help the muscles around the joint be more strong and improve movement. You may also learn how to move your body in ways that are easier on the joint. Direct manipulation. A therapist may gently move the SI joint to help it work better. Using a device that provides electrical stimulation to help reduce pain at the joint. Other treatments may include: Steroid injections. These can help reduce pain and swelling in the joint. Radiofrequency ablation. This uses heat to burn away nerves that are carrying pain messages from the joint. Surgery to put in screws and plates that limit or prevent joint motion. This is rare. Follow these instructions at home: Medicines Take your medicines only as told. You may need to take these steps to help prevent or treat trouble pooping (constipation): Take medicines to help you poop. Eat foods high in fiber, like beans, whole grains, and fresh fruits and vegetables. Drink more fluids as told. Ask your provider if it's safe to drive or use machines while taking your medicine. If you have a brace: Wear the brace as told. Take it off only if your provider says you can. Keep the brace clean. If the brace is not waterproof: Do not let it get wet. Managing pain, stiffness, and swelling     Icing can help with pain and swelling. Heat may help with muscle tension or spasms. Ask your provider if you  should use ice or heat. Use ice or an ice pack as told. Place a towel between your skin and the ice. Leave the ice on for 20 minutes, 2-3 times a day. Use heat as told. Use the heat source that your provider recommends, such as a moist heat pack or a heating pad. Do this as often as told. Place a towel between your skin and the heat source. Leave the heat on for 20-30 minutes. If your skin turns red, take off the ice or heat  right away to prevent skin damage. The risk of damage is higher if you can't feel pain, heat, or cold. General instructions Rest as told. Ask what things are safe for you to do at home. Ask when you can go back to work or school. Exercise as told. Contact a health care provider if: Your pain is not controlled with medicine. You have a fever. Your pain is getting worse. Get help right away if: You have weakness, numbness, or tingling in your legs or feet. You lose control of your bladder or bowels. This information is not intended to replace advice given to you by your health care provider. Make sure you discuss any questions you have with your health care provider. Document Revised: 02/23/2023 Document Reviewed: 02/23/2023 Elsevier Patient Education  2025 [employer].

## 2023-09-18 NOTE — Telephone Encounter (Signed)
 Copied from CRM [id number]. Topic: Clinical - Order For Equipment >> Sep 18, 2023  4:23 PM Felizardo Hotter wrote: Reason for CRM: Received call from pt needs order for shoes for both feet, left foot ankle brace from [facility] [address] [place],  [place]  [address] ph: [phone]. They told pt that order for approval was sent by fax to clinic. Please call [phone] for order or call pt at [phone].

## 2023-09-20 ENCOUNTER — Encounter (HOSPITAL_COMMUNITY): Payer: Self-pay

## 2023-09-24 ENCOUNTER — Other Ambulatory Visit: Payer: Self-pay | Admitting: Internal Medicine

## 2023-09-24 ENCOUNTER — Telehealth: Payer: Self-pay

## 2023-09-24 ENCOUNTER — Ambulatory Visit (HOSPITAL_COMMUNITY)
Admission: RE | Admit: 2023-09-24 | Discharge: 2023-09-24 | Disposition: A | Discharge status: Home / Self Care | Source: Ambulatory Visit | Attending: Internal Medicine | Admitting: Internal Medicine

## 2023-09-24 DIAGNOSIS — R600 Localized edema: Secondary | ICD-10-CM

## 2023-09-24 DIAGNOSIS — R0609 Other forms of dyspnea: Secondary | ICD-10-CM

## 2023-09-24 DIAGNOSIS — I5032 Chronic diastolic (congestive) heart failure: Secondary | ICD-10-CM | POA: Insufficient documentation

## 2023-09-24 DIAGNOSIS — I272 Pulmonary hypertension, unspecified: Secondary | ICD-10-CM

## 2023-09-24 MED ORDER — GADOBUTROL 1 MMOL/ML IV SOLN
10.0000 mL | Freq: Once | INTRAVENOUS | Status: AC | PRN
Start: 2023-09-24 — End: 2023-09-24
  Administered 2023-09-24: 10 mL via INTRAVENOUS

## 2023-09-24 NOTE — Telephone Encounter (Signed)
 Called patients treating Dr. Lawerance spoke with [place] she seen [facility] ppw scanned to patients chart but never signed by Dr. Joshua she is sending a note to Dr. Asking him to sign and fax back to us   Lolita Schultze Cped CFo, CFm

## 2023-09-24 NOTE — Telephone Encounter (Signed)
 Copied from CRM [id number]. Topic: Medical Record Request - Other >> Sep 24, 2023 10:06 AM [place] J wrote: Reason for CRM: Trish calling from [facility] in regards to a document that was sent over 09/03/2023. The paperwork that was sent through is needing to be signed and sent back as soon as possible.

## 2023-09-25 NOTE — Progress Notes (Signed)
 done

## 2023-09-26 ENCOUNTER — Ambulatory Visit: Admitting: Internal Medicine

## 2023-09-28 ENCOUNTER — Ambulatory Visit: Payer: Self-pay | Admitting: Internal Medicine

## 2023-09-28 ENCOUNTER — Telehealth: Payer: Self-pay

## 2023-09-28 NOTE — Telephone Encounter (Signed)
 Unable to reach trish. LMTRC

## 2023-09-28 NOTE — Telephone Encounter (Signed)
 Jazz from Dr. Debby Molt' office LVM. She asks that you call her back at [phone]. Did not specify reason for call.

## 2023-10-01 NOTE — Telephone Encounter (Signed)
 Unable to reach trisha lmtrc

## 2023-10-01 NOTE — Telephone Encounter (Signed)
 Copied from CRM [id number]. Topic: General - Call Back - No Documentation >> Oct 01, 2023  3:54 PM Mercedes MATSU wrote: Reason for CRM: [facility]  [phone] please call back CMA [facility]

## 2023-10-04 NOTE — Telephone Encounter (Signed)
 Unable to reach [name]. Unable to leave a VM today.

## 2023-10-09 ENCOUNTER — Ambulatory Visit (INDEPENDENT_AMBULATORY_CARE_PROVIDER_SITE_OTHER)

## 2023-10-09 DIAGNOSIS — E118 Type 2 diabetes mellitus with unspecified complications: Secondary | ICD-10-CM

## 2023-10-09 DIAGNOSIS — M25372 Other instability, left ankle: Secondary | ICD-10-CM

## 2023-10-09 DIAGNOSIS — M2142 Flat foot [pes planus] (acquired), left foot: Secondary | ICD-10-CM | POA: Diagnosis not present

## 2023-10-09 DIAGNOSIS — M2141 Flat foot [pes planus] (acquired), right foot: Secondary | ICD-10-CM

## 2023-10-09 DIAGNOSIS — M2041 Other hammer toe(s) (acquired), right foot: Secondary | ICD-10-CM

## 2023-10-09 DIAGNOSIS — M2042 Other hammer toe(s) (acquired), left foot: Secondary | ICD-10-CM | POA: Diagnosis not present

## 2023-10-10 NOTE — Progress Notes (Signed)
   Subjective:    Patient ID: [name], male    DOB: [birth year], 78 y.o.   MRN: [id number]  HPI    PROCEDURE RECORD [facility]   Name: [name] DOB:08-10-1945 MRN: [id number]  Date:10/10/2023  Physician: Prentice Compton    Nurse/CMA: Tye Kitty MA  Allergies:  Allergies  Allergen Reactions   Morphine Nausea And Vomiting and Other (See Comments)    Severe hallucinations Other reaction(s): Hallucinations   Penicillins Other (See Comments)    Passed out after injection Did it involve swelling of the face/tongue/throat, SOB, or low BP? Unknown Did it involve sudden or severe rash/hives, skin peeling, or any reaction on the inside of your mouth or nose? No Did you need to seek medical attention at a hospital or doctor's office? Yes When did it last happen?    teenager   If all above answers are "NO", may proceed with cephalosporin use. Other reaction(s): Dizziness, Unknown   Codeine Nausea Only   Propoxyphene N-Acetaminophen  Nausea Only    Consent Signed: Yes.    Is patient diabetic? Yes.    CBG today? No  Pregnant: No. LMP: No LMP for male patient. (age 3-55)  Anticoagulants: yes (Eliquis ) Anti-inflammatory: no Antibiotics: no  Procedure: Right Sacroiliac Injection  Position: Prone Start Time: 3:50 pm  End Time: 3:54 pm  Fluoro Time: 23  RN/CMA Mcarthur Ivins MA Nayelis Bonito MA    Time 3:30 pm 4:03 pm    BP 90/57 111/64    Pulse 68 71    Respirations 20 18    O2 Sat 96 (2 LT) 97    S/S 6 6    Pain Level 6/10 6/10     D/C home with wife, patient A & O X 3, D/C instructions reviewed, and sits independently.       Review of Systems     Objective:   Physical Exam        Assessment & Plan:

## 2023-10-10 NOTE — Progress Notes (Signed)
 Patient presents today to pick up diabetic shoes and insoles.  Patient was dispensed 1 pair of diabetic shoes and 3 pairs of total contact diabetic insoles. Fit was satisfactory. Instructions for break-in and wear was reviewed and a copy was given to the patient.   Re-appointment for regularly scheduled diabetic foot care visits or if they should experience any trouble with the shoes or insoles.  AFO Orthosis was dispensed and fit was good patient was very happy with function. Brace adds support and stability to ankle and will also help to slow progression of ankle deformity.  Patient reviewed with me instructions for break-in and wear. Written instructions given to patient.  Lolita Schultze Cped, CFo, CFm

## 2023-10-11 ENCOUNTER — Encounter: Payer: Self-pay | Admitting: Physical Medicine & Rehabilitation

## 2023-10-11 ENCOUNTER — Encounter: Attending: Physical Medicine & Rehabilitation | Admitting: Physical Medicine & Rehabilitation

## 2023-10-11 VITALS — BP 90/57 | HR 69 | Temp 98.4°F | Ht 73.0 in

## 2023-10-11 DIAGNOSIS — M533 Sacrococcygeal disorders, not elsewhere classified: Secondary | ICD-10-CM | POA: Diagnosis not present

## 2023-10-11 DIAGNOSIS — M545 Low back pain, unspecified: Secondary | ICD-10-CM

## 2023-10-11 DIAGNOSIS — M259 Joint disorder, unspecified: Secondary | ICD-10-CM | POA: Diagnosis not present

## 2023-10-11 MED ORDER — LIDOCAINE HCL (PF) 2 % IJ SOLN
1.0000 mL | Freq: Once | INTRAMUSCULAR | Status: AC
  Administered 2023-10-11: 1 mL

## 2023-10-11 MED ORDER — BETAMETHASONE SOD PHOS & ACET 6 (3-3) MG/ML IJ SUSP
3.0000 mg | Freq: Once | INTRAMUSCULAR | Status: AC
Start: 2023-10-11 — End: 2023-10-11
  Administered 2023-10-11: 3 mg via INTRA_ARTICULAR

## 2023-10-11 MED ORDER — IOHEXOL 180 MG/ML  SOLN
1.0000 mL | Freq: Once | INTRAMUSCULAR | Status: AC
  Administered 2023-10-11: 1 mL

## 2023-10-11 MED ORDER — LIDOCAINE HCL 1 % IJ SOLN
5.0000 mL | Freq: Once | INTRAMUSCULAR | Status: AC
Start: 2023-10-11 — End: 2023-10-11
  Administered 2023-10-11: 5 mL

## 2023-10-11 NOTE — Progress Notes (Signed)
Right sacroiliac injection under fluoroscopic guidance  Indication: Right Low back and buttocks pain not relieved by medication management and other conservative care.  Informed consent was obtained after describing risks and benefits of the procedure with the patient, this includes bleeding, bruising, infection, paralysis and medication side effects. The patient wishes to proceed and has given written consent. The patient was placed in a prone position. The lumbar and sacral area was marked and prepped with Betadine. A 25-gauge 1-1/2 inch needle was inserted into the skin and subcutaneous tissue and 1 mL of 1% lidocaine was injected. Then a 25-gauge 3.5 inch spinal needle was inserted under fluoroscopic guidance into the Right sacroiliac joint. AP and lateral images were utilized. Omnipaque 180x0.5 mL under live fluoroscopy demonstrated no intravascular uptake. Then a solution containing one ML of 6 mgper ml betamethasone and 2 ML of 1% lidocaine MPF was injected x1.5 mL. Patient tolerated the procedure well. Post procedure instructions were given. Please see post procedure form. 

## 2023-10-11 NOTE — Patient Instructions (Signed)
Sacroiliac injection was performed today. A combination of numbing medicine (lidocaine) plus a cortisone medicine (betamethasone) was injected. The injection was done under x-ray guidance. This procedure has been performed to help reduce low back and buttocks pain as well as potentially hip pain. The duration of this injection is variable lasting from hours to  Months. It may repeated if needed. 

## 2023-10-12 NOTE — Telephone Encounter (Signed)
 This has been handled and the patient has received his shoes and his ankle brace.

## 2023-11-06 ENCOUNTER — Encounter: Payer: Medicare Other | Attending: Physical Medicine & Rehabilitation | Admitting: Registered Nurse

## 2023-11-06 ENCOUNTER — Encounter: Payer: Self-pay | Admitting: Registered Nurse

## 2023-11-06 VITALS — BP 107/67 | HR 74 | Ht 73.0 in

## 2023-11-06 DIAGNOSIS — Z79891 Long term (current) use of opiate analgesic: Secondary | ICD-10-CM | POA: Diagnosis not present

## 2023-11-06 DIAGNOSIS — G894 Chronic pain syndrome: Secondary | ICD-10-CM | POA: Diagnosis not present

## 2023-11-06 DIAGNOSIS — Z5181 Encounter for therapeutic drug level monitoring: Secondary | ICD-10-CM | POA: Diagnosis not present

## 2023-11-06 DIAGNOSIS — M48061 Spinal stenosis, lumbar region without neurogenic claudication: Secondary | ICD-10-CM | POA: Insufficient documentation

## 2023-11-06 MED ORDER — TRAMADOL HCL 50 MG PO TABS: 50.0000 mg | ORAL_TABLET | Freq: Three times a day (TID) | ORAL | 5 refills | Status: DC | PRN

## 2023-11-06 NOTE — Progress Notes (Signed)
 Subjective:    Patient ID: [name], male    DOB: May 16, 1945, 78 y.o.   MRN: [id number]  HPI: [name] is a 78 y.o. male who returns for follow up appointment for chronic pain and medication refill. He states his pain is located in his lower back.He  rates his pain 5. His current exercise regime is walking and performing stretching exercises.  [name] Morphine equivalent is 30.00 MME.  He  is also prescribed Lorazepam  by Dr. Curry .We have discussed the black box warning of using opioids and benzodiazepines. I highlighted the dangers of using these drugs together and discussed the adverse events including respiratory suppression, overdose, cognitive impairment and importance of compliance with current regimen. We will continue to monitor and adjust as indicated.  he is being closely monitored and under the care of his psychiatrist.   Last Oral Swab was Performed on 07/26/2023, it was consistent.      Pain Inventory Average Pain 5 Pain Right Now 5 My pain is intermittent, sharp, stabbing, and aching  In the last 24 hours, has pain interfered with the following? General activity 5 Relation with others 5 Enjoyment of life 5 What TIME of day is your pain at its worst? daytime Sleep (in general) Good  Pain is worse with: walking, bending, and standing Pain improves with: rest and medication Relief from Meds: 5  Family History  Problem Relation Age of Onset   Depression Mother    Hypertension Mother    Dementia Mother    Heart disease Father        MI   Depression Brother    Stroke Paternal Aunt        CVA   Diabetes Paternal Uncle    Heart disease Paternal Uncle        MI   Heart disease Paternal Grandmother        MI   Diabetes Cousin        PATERNAL   Social History   Socioeconomic History   Marital status: Married    Spouse name: [name]   Number of children: 1   Years of education: Not on file   Highest education level: Bachelor's degree  (e.g., BA, AB, BS)  Occupational History   Occupation: retired-Manager [employer]  Tobacco Use   Smoking status: Former    Current packs/day: 0.00    Average packs/day: 0.2 packs/day for 13.4 years (2.7 ttl pk-yrs)    Types: Cigarettes    Start date: 04/03/1961    Quit date: 08/30/1974    Years since quitting: 49.2   Smokeless tobacco: Never  Vaping Use   Vaping status: Never Used  Substance and Sexual Activity   Alcohol  use: No    Alcohol /week: 0.0 standard drinks of alcohol    Drug use: No   Sexual activity: Not Currently  Other Topics Concern   Not on file  Social History Narrative   Lives at home w/ his wife   Right-handed   Caffeine: decaf coffee, occasional tea   Social Drivers of Corporate investment banker Strain: Medium Risk (08/30/2023)   Overall Financial Resource Strain (CARDIA)    Difficulty of Paying Living Expenses: Somewhat hard  Food Insecurity: No Food Insecurity (08/30/2023)   Hunger Vital Sign    Worried About Running Out of Food in the Last Year: Never true    Ran Out of Food in the Last Year: Never true  Transportation Needs: No Transportation Needs (08/30/2023)   PRAPARE -  Administrator, Civil Service (Medical): No    Lack of Transportation (Non-Medical): No  Physical Activity: Inactive (08/30/2023)   Exercise Vital Sign    Days of Exercise per Week: 0 days    Minutes of Exercise per Session: 0 min  Stress: Stress Concern Present (08/30/2023)   [employer] of Occupational Health - Occupational Stress Questionnaire    Feeling of Stress : To some extent  Social Connections: Moderately Integrated (08/30/2023)   Social Connection and Isolation Panel    Frequency of Communication with Friends and Family: Three times a week    Frequency of Social Gatherings with Friends and Family: Once a week    Attends Religious Services: 1 to 4 times per year    Active Member of [employer] or Organizations: No    Attends Banker Meetings: Never     Marital Status: Married  Recent Concern: Social Connections - Moderately Isolated (06/22/2023)   Social Connection and Isolation Panel    Frequency of Communication with Friends and Family: More than three times a week    Frequency of Social Gatherings with Friends and Family: Never    Attends Religious Services: Never    Database administrator or Organizations: No    Attends Engineer, structural: Never    Marital Status: Married   Past Surgical History:  Procedure Laterality Date   ARTHROSCOPY KNEE W/ DRILLING     BREATH TEK H PYLORI N/A 12/23/2012   Procedure: BREATH TEK H PYLORI;  Surgeon: Camellia CHRISTELLA Blush, MD;  Location: [facility];  Service: General;  Laterality: N/A;   CHOLECYSTECTOMY  1989   COLONOSCOPY N/A 07/31/2013   Procedure: COLONOSCOPY;  Surgeon: Lupita FORBES Commander, MD;  Location: [facility] END[facility]COPY;  Service: Endoscopy;  Laterality: N/A;   ESOPHAGOGASTRODUODENOSCOPY N/A 07/22/2013   Procedure: ESOPHAGOGASTRODUODENOSCOPY (EGD);  Surgeon: Norleen LOISE Kiang, MD;  Location: [facility]COPY;  Service: Endoscopy;  Laterality: N/A;   ESOPHAGOGASTRODUODENOSCOPY N/A 07/31/2013   Procedure: ESOPHAGOGASTRODUODENOSCOPY (EGD);  Surgeon: Lupita FORBES Commander, MD;  Location: [facility]COPY;  Service: Endoscopy;  Laterality: N/A;   INTRAOPERATIVE TRANSESOPHAGEAL ECHOCARDIOGRAM N/A 07/18/2013   Procedure: INTRAOPERATIVE TRANSESOPHAGEAL ECHOCARDIOGRAM;  Surgeon: Dallas KATHEE Jude, MD;  Location: [facility] OR;  Service: Open Heart Surgery;  Laterality: N/A;   LAPAROSCOPIC GASTRIC BANDING WITH HIATAL HERNIA REPAIR N/A 04/08/2013   Procedure: LAPAROSCOPIC GASTRIC BANDING WITH possible  HIATAL HERNIA REPAIR;  Surgeon: Camellia CHRISTELLA Blush, MD;  Location: [facility] ORS;  Service: General;  Laterality: N/A;   LITHOTRIPSY     renal calculi     RIGHT AND LEFT HEART CATH N/A 05/12/2021   Procedure: RIGHT AND LEFT HEART CATH;  Surgeon: Cherrie Toribio SAUNDERS, MD;  Location: [facility] INVASIVE CV LAB;  Service: Cardiovascular;  Laterality: N/A;   SUBXYPHOID  PERICARDIAL WINDOW N/A 07/18/2013   Procedure: SUBXYPHOID PERICARDIAL WINDOW;  Surgeon: Dallas KATHEE Jude, MD;  Location: [facility] OR;  Service: Thoracic;  Laterality: N/A;   TONSILLECTOMY     total knee replacment     bilat   trigger finger repair     also lue, cts surgically repaired   WISDOM TOOTH EXTRACTION     Past Surgical History:  Procedure Laterality Date   ARTHROSCOPY KNEE W/ DRILLING     BREATH TEK H PYLORI N/A 12/23/2012   Procedure: BREATH TEK VEAR LORA;  Surgeon: Camellia CHRISTELLA Blush, MD;  Location: [facility];  Service: General;  Laterality: N/A;   CHOLECYSTECTOMY  1989   COLONOSCOPY N/A 07/31/2013  Procedure: COLONOSCOPY;  Surgeon: Lupita FORBES Commander, MD;  Location: [facility] ENDOSCOPY;  Service: Endoscopy;  Laterality: N/A;   ESOPHAGOGASTRODUODENOSCOPY N/A 07/22/2013   Procedure: ESOPHAGOGASTRODUODENOSCOPY (EGD);  Surgeon: Norleen LOISE Kiang, MD;  Location: [facility] ENDOSCOPY;  Service: Endoscopy;  Laterality: N/A;   ESOPHAGOGASTRODUODENOSCOPY N/A 07/31/2013   Procedure: ESOPHAGOGASTRODUODENOSCOPY (EGD);  Surgeon: Lupita FORBES Commander, MD;  Location: [facility] ENDOSCOPY;  Service: Endoscopy;  Laterality: N/A;   INTRAOPERATIVE TRANSESOPHAGEAL ECHOCARDIOGRAM N/A 07/18/2013   Procedure: INTRAOPERATIVE TRANSESOPHAGEAL ECHOCARDIOGRAM;  Surgeon: Dallas KATHEE Jude, MD;  Location: [facility] OR;  Service: Open Heart Surgery;  Laterality: N/A;   LAPAROSCOPIC GASTRIC BANDING WITH HIATAL HERNIA REPAIR N/A 04/08/2013   Procedure: LAPAROSCOPIC GASTRIC BANDING WITH possible  HIATAL HERNIA REPAIR;  Surgeon: Camellia CHRISTELLA Blush, MD;  Location: [facility] ORS;  Service: General;  Laterality: N/A;   LITHOTRIPSY     renal calculi     RIGHT AND LEFT HEART CATH N/A 05/12/2021   Procedure: RIGHT AND LEFT HEART CATH;  Surgeon: Cherrie Toribio SAUNDERS, MD;  Location: [facility] LAB;  Service: Cardiovascular;  Laterality: N/A;   SUBXYPHOID PERICARDIAL WINDOW N/A 07/18/2013   Procedure: SUBXYPHOID PERICARDIAL WINDOW;  Surgeon: Dallas KATHEE Jude, MD;  Location: [facility] OR;   Service: Thoracic;  Laterality: N/A;   TONSILLECTOMY     total knee replacment     bilat   trigger finger repair     also lue, cts surgically repaired   WISDOM TOOTH EXTRACTION     Past Medical History:  Diagnosis Date   Anemia 07/29/2013   Asthma    childhood asthma   Benign essential HTN    Benign prostatic hypertrophy    several prostate biopsies neg for cancer.    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  Opioid Risk Score:   Fall Risk Score:  `1  Depression screen [facility] 2/9     10/11/2023    3:28 PM 09/18/2023    2:58 PM 04/30/2023    2:45 PM 01/29/2023    1:31 PM 10/10/2022    3:04 PM 06/19/2022    2:24 PM 04/25/2022    1:56 PM  Depression screen PHQ 2/9  Decreased Interest 0 0 0 0 0 0 0  Down, Depressed, Hopeless 0 0 0 0 0 0 0  PHQ - 2 Score 0 0 0 0 0 0 0  Altered sleeping   0 1 0    Tired, decreased energy   0 1 0    Change in appetite   0 0 0    Feeling bad or failure  about yourself    0 0 0    Trouble concentrating   1 0 0    Moving slowly or fidgety/restless   0 0 0    Suicidal thoughts   0 0 0    PHQ-9 Score   1 2 0    Difficult doing work/chores   Not difficult at all  Somewhat difficult       Review of Systems  Musculoskeletal:  Positive for back pain and gait problem.  All other systems reviewed and are negative.      Objective:   Physical Exam Vitals and nursing note reviewed.  Constitutional:      Appearance: Normal appearance.  Cardiovascular:     Rate and Rhythm: Normal rate and regular rhythm.     Pulses: Normal pulses.     Heart sounds: Normal heart sounds.  Pulmonary:     Effort: Pulmonary effort is normal.     Breath sounds: Normal breath sounds.     Comments: Continuous Oxygen  2 Liters Nasal Cannula  Musculoskeletal:     Right lower leg: Edema present.     Left lower leg: Edema present.     Comments: Normal Muscle Bulk and Muscle Testing Reveals:  Upper Extremities: Full ROM and Muscle Strength 5/5 Lumbar Paraspinal Tenderness: L-4-L-5 Lower Extremities : Full ROM and Muscle Strength 5/5 Left Lower Extremity: Brace Intact  Left Lower Extremity Flexion Produces Pain left Ankle  Arises from Table  Slowly Narrow Based Gait     Skin:    General: Skin is warm and dry.  Neurological:     Mental Status: He is alert and oriented to person, place, and time.  Psychiatric:        Mood and Affect: Mood normal.        Behavior: Behavior normal.          Assessment & Plan:  1.Chronic Low Back Pain/  Lumbosacral Spondylosis without myelopathy: S/P 09/03/2020:  Bilateral Lumbar L3, L4 medial branch blocks and L 5 dorsal ramus injection under fluoroscopic guidance   with good relief noted. Encouraged to increase HEP as tolerated. Continue to monitor. 11/06/2023 2. Chronic Deconditioning/ Morbid Obesity History of Wallenberg Syndrome: Continue  HEP as Tolerated. He verbalizes understanding. Continue to Monitor. 00/08/2023 3. Paresthesia Lower Extremities: Continue Tramadol . Continue to Monitor. 11/06/2023 4. Bilateral Chronic Knee pain: Continue HEP as Tolerated. Continue to Monitor.  11/06/2023 5. Chronic Pain Syndrome: Refilled  Tramadol   50 mg one tablet three times a day as needed for pain #90. Continue to monitor.  We will continue the opioid monitoring program, this consists of regular clinic visits, examinations, urine drug screen, pill counts as well as use of [place]  Controlled Substance Reporting system. 11/06/2023 6. Bradycardia: Mr. Cline reports Cardiology Following. Continue to Monitor.11/06/2023  7. Polyneuropathy: Continue Tramadol . Continue to Monitor. 11/06/2023 8. Chronic Ankle Pain: No complaints today. Continue HEP as Tolerated. Continue current medication regimen. Continue to Monitor. 11/06/2023. 9. Chronic Bilateral Shoulder Pain: No complaints today. Continue HEP as Tolerated. Continue to Monitor. 11/06/2023. F/U in 2 months

## 2023-11-09 DIAGNOSIS — H52203 Unspecified astigmatism, bilateral: Secondary | ICD-10-CM | POA: Diagnosis not present

## 2023-11-09 DIAGNOSIS — H2513 Age-related nuclear cataract, bilateral: Secondary | ICD-10-CM | POA: Diagnosis not present

## 2023-11-09 DIAGNOSIS — E119 Type 2 diabetes mellitus without complications: Secondary | ICD-10-CM | POA: Diagnosis not present

## 2023-11-09 LAB — HM DIABETES EYE EXAM

## 2023-11-14 ENCOUNTER — Ambulatory Visit (HOSPITAL_BASED_OUTPATIENT_CLINIC_OR_DEPARTMENT_OTHER): Admitting: General Surgery

## 2023-11-15 ENCOUNTER — Other Ambulatory Visit: Payer: Self-pay | Admitting: Internal Medicine

## 2023-11-15 DIAGNOSIS — I480 Paroxysmal atrial fibrillation: Secondary | ICD-10-CM

## 2023-11-26 ENCOUNTER — Ambulatory Visit: Admitting: Internal Medicine

## 2023-12-04 ENCOUNTER — Other Ambulatory Visit: Payer: Self-pay | Admitting: Internal Medicine

## 2023-12-04 DIAGNOSIS — E118 Type 2 diabetes mellitus with unspecified complications: Secondary | ICD-10-CM

## 2023-12-04 NOTE — Telephone Encounter (Unsigned)
 Copied from CRM #[id number]. Topic: Clinical - Medication Refill >> Dec 04, 2023  1:49 PM Kathrin PARAS wrote: Medication:  MOUNJARO  10 MG/0.5ML Pen   Has the patient contacted their pharmacy? Yes (Agent: If no, request that the patient contact the pharmacy for the refill. If patient does not wish to contact the pharmacy document the reason why and proceed with request.) (Agent: If yes, when and what did the pharmacy advise?)  This is the patient's preferred pharmacy:  [id number] [address], [place] - [address] AT [facility] & [address] [place] [address] Phone: [phone] Fax: [phone]  Is this the correct pharmacy for this prescription? Yes If no, delete pharmacy and type the correct one.   Has the prescription been filled recently? No  Is the patient out of the medication? Yes  Has the patient been seen for an appointment in the last year OR does the patient have an upcoming appointment? Yes  Can we respond through MyChart? Yes  Agent: Please be advised that Rx refills may take up to 3 business days. We ask that you follow-up with your pharmacy.

## 2023-12-17 ENCOUNTER — Telehealth (HOSPITAL_BASED_OUTPATIENT_CLINIC_OR_DEPARTMENT_OTHER): Admitting: Psychiatry

## 2023-12-17 ENCOUNTER — Encounter (HOSPITAL_COMMUNITY): Payer: Self-pay | Admitting: Psychiatry

## 2023-12-17 VITALS — Wt 329.0 lb

## 2023-12-17 DIAGNOSIS — F330 Major depressive disorder, recurrent, mild: Secondary | ICD-10-CM

## 2023-12-17 DIAGNOSIS — F419 Anxiety disorder, unspecified: Secondary | ICD-10-CM

## 2023-12-17 MED ORDER — SERTRALINE HCL 100 MG PO TABS: 200.0000 mg | ORAL_TABLET | Freq: Every day | ORAL | 1 refills | Status: AC

## 2023-12-17 MED ORDER — LORAZEPAM 0.5 MG PO TABS: 0.5000 mg | ORAL_TABLET | Freq: Every day | ORAL | 5 refills | Status: AC

## 2023-12-17 NOTE — Progress Notes (Signed)
 [facility] MD Virtual Progress Note   Patient Location: Home Provider Location: Home Office  I connect with patient by telephone and verified that I am speaking with correct person by using two identifiers. I discussed the limitations of evaluation and management by telemedicine and the availability of in person appointments. I also discussed with the patient that there may be a patient responsible charge related to this service. The patient expressed understanding and agreed to proceed.  [name] [id number] 78 y.o.  12/17/2023 2:18 PM  History of Present Illness:  Patient is evaluated by phone session.  He could not do video due to technical issues.  He reported things are going well.  He reported due to oxygen  he is very limited to travel.  His oxygen  tank only last for 6 hours and he has to come back home within 6 hours.  Reported wife is very helpful and supportive.  He is taking Zoloft  and Ativan  which is keeping his anxiety and depression stable.  He sleeps at least 5 6 hours with the help of BiPAP.  Denies any mania, psychosis, hallucination, crying spells or any feeling of hopelessness or worthlessness.  He enjoyed the company of granddaughter.  He does not want to change the medication since it is keeping his mood stable.  Past Psychiatric History: H/O inpatient in 2006 for depression and having suicidal thoughts. Took Risperdal  but d/c after stroke in March 2018.     Past Medical History:  Diagnosis Date   Anemia 07/29/2013   Asthma    childhood asthma   Benign essential HTN    Benign prostatic hypertrophy    several prostate biopsies neg for cancer.    Bilateral hip pain    Cancer (HCC)    CHF (congestive heart failure) (HCC)    Chronic anticoagulation 07/22/2013   Complication of anesthesia    MORPHINE CAUSES HALLUCINATIONS   CVA (cerebral vascular accident) (HCC) 06/2016   Depression    Diabetes mellitus without complication (HCC)    Diastolic  congestive heart failure (HCC) 07/16/2013   Dysphagia, post-stroke    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   FH: colonic polyps 2010   Gout    History of kidney stones    Hypertension    Kidney cysts    Morbid obesity (HCC)    Numbness    feet / legs   PAF (paroxysmal atrial fibrillation) with RVR 07/29/2013   Pericarditis 2015   s/p window   Recurrent pulmonary emboli (HCC) 06/13/2015   Sleep apnea    USES C PAP   Stroke syndrome    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   apixaban  (ELIQUIS ) 5 MG TABS tablet TAKE 1 TABLET(5 MG) BY MOUTH TWICE DAILY   atorvastatin  (LIPITOR ) 80 MG tablet TAKE 1 TABLET(80 MG) BY MOUTH DAILY   carboxymethylcellulose (REFRESH PLUS) 0.5 % SOLN Place 1 drop into both eyes 3 (three) times daily as needed (dry eyes).   ciclopirox  (LOPROX ) 0.77 % cream Apply topically 2 (two) times daily.   [facility] MELATONIN GUMMIES PO Take 5 mg by mouth at bedtime.   Docusate Sodium  (DSS) 100 MG CAPS Take 100 mg by mouth every other day.   empagliflozin  (JARDIANCE ) 10 MG TABS tablet TAKE 1 TABLET(10 MG) BY MOUTH DAILY BEFORE BREAKFAST   finasteride  (PROSCAR ) 5 MG tablet Take 1 tablet (  5 mg total) by mouth daily.   flecainide  (TAMBOCOR ) 50 MG tablet TAKE 1 TABLET(50 MG) BY MOUTH EVERY 12 HOURS   LORazepam  (ATIVAN ) 0.5 MG tablet Take 1 tablet (0.5 mg total) by mouth at bedtime.   methylPREDNISolone  (MEDROL  DOSEPAK) 4 MG TBPK tablet Use as directed.   metoprolol  succinate (TOPROL  XL) 25 MG 24 hr tablet Take 0.5 tablets (12.5 mg total) by mouth daily.   montelukast  (SINGULAIR ) 10 MG tablet TAKE 1 TABLET(10 MG) BY MOUTH AT BEDTIME   MOUNJARO  10 MG/0.5ML Pen ADMINISTER 10 MG UNDER THE SKIN 1 TIME A WEEK   Multiple Vitamin (MULTIVITAMIN WITH MINERALS) TABS tablet Take 1 tablet by mouth daily.   pantoprazole  (PROTONIX ) 40 MG tablet  TAKE 1 TABLET(40 MG) BY MOUTH DAILY   potassium chloride  SA (KLOR-CON  M) 20 MEQ tablet TAKE 1 TABLET(20 MEQ) BY MOUTH TWICE DAILY   rOPINIRole  (REQUIP ) 0.5 MG tablet TAKE 1 TABLET(0.5 MG) BY MOUTH THREE TIMES DAILY   sertraline  (ZOLOFT ) 100 MG tablet Take 2 tablets (200 mg total) by mouth at bedtime.   torsemide  (DEMADEX ) 20 MG tablet TAKE 3 TABLETS BY MOUTH EVERY DAY   traMADol  (ULTRAM ) 50 MG tablet Take 1 tablet (50 mg total) by mouth 3 (three) times daily as needed for moderate pain (pain score 4-6).   zinc  gluconate 50 MG tablet Take 1 tablet (50 mg total) by mouth daily.   No facility-administered encounter medications on file as of 12/17/2023.    Recent Results (from the past 2160 hours)  HM DIABETES EYE EXAM     Status: None   Collection Time: 11/09/23  8:36 AM  Result Value Ref Range   HM Diabetic Eye Exam No Retinopathy No Retinopathy    Comment: Abstracted by HIM     Psychiatric Specialty Exam: Physical Exam  Review of Systems  Weight (!) 329 lb (149.2 kg).There is no height or weight on file to calculate BMI.  General Appearance: NA  Eye Contact:  NA  Speech:  Slow  Volume:  Decreased  Mood:  Dysphoric  Affect:  NA  Thought Process:  Goal Directed  Orientation:  Full (Time, Place, and Person)  Thought Content:  Rumination  Suicidal Thoughts:  No  Homicidal Thoughts:  No  Memory:  Immediate;   Fair Recent;   Fair Remote;   Fair  Judgement:  Intact  Insight:  Present  Psychomotor Activity:  NA  Concentration:  Concentration: Fair and Attention Span: Fair  Recall:  Good  Fund of Knowledge:  Good  Language:  Good  Akathisia:  No  Handed:  Right  AIMS (if indicated):     Assets:  Communication Skills Desire for Improvement Housing Social Support  ADL's:  Intact  Cognition:  WNL  Sleep:  on BIPAP getting 5-6 hrs       11/06/2023   12:51 PM 10/11/2023    3:28 PM 09/18/2023    2:58 PM 04/30/2023    2:45 PM 01/29/2023    1:31 PM  Depression screen PHQ 2/9   Decreased Interest 0 0 0 0 0  Down, Depressed, Hopeless 0 0 0 0 0  PHQ - 2 Score 0 0 0 0 0  Altered sleeping    0 1  Tired, decreased energy    0 1  Change in appetite    0 0  Feeling bad or failure about yourself     0 0  Trouble concentrating    1 0  Moving slowly or fidgety/restless  0 0  Suicidal thoughts    0 0  PHQ-9 Score    1 2  Difficult doing work/chores    Not difficult at all Somewhat difficult    Assessment/Plan: Major depressive disorder, recurrent episode, mild (HCC) - Plan: LORazepam  (ATIVAN ) 0.5 MG tablet, sertraline  (ZOLOFT ) 100 MG tablet  Anxiety - Plan: LORazepam  (ATIVAN ) 0.5 MG tablet  Patient is stable on Zoloft  and Ativan .  No major panic attack.  Continue Zoloft  200 mg daily and Ativan  0.5 mg as needed.  Recommend to call back if is any question or any concern.  Denies drinking or using any illegal substance.  Follow-up in 6 months.   Follow Up Instructions:     I discussed the assessment and treatment plan with the patient. The patient was provided an opportunity to ask questions and all were answered. The patient agreed with the plan and demonstrated an understanding of the instructions.   The patient was advised to call back or seek an in-person evaluation if the symptoms worsen or if the condition fails to improve as anticipated.    Collaboration of Care: Other provider involved in patient's care AEB notes are available in epic to review  Patient/Guardian was advised Release of Information must be obtained prior to any record release in order to collaborate their care with an outside provider. Patient/Guardian was advised if they have not already done so to contact the registration department to sign all necessary forms in order for us  to release information regarding their care.   Consent: Patient/Guardian gives verbal consent for treatment and assignment of benefits for services provided during this visit. Patient/Guardian expressed understanding  and agreed to proceed.     Total encounter time 18 minutes which includes face-to-face time, chart reviewed, care coordination, order entry and documentation during this encounter.   Note: This document was prepared by [employer] voice dictation technology and any errors that results from this process are unintentional.    Leni [name] Client, MD 12/17/2023

## 2023-12-31 ENCOUNTER — Ambulatory Visit
Admission: EM | Admit: 2023-12-31 | Discharge: 2023-12-31 | Disposition: A | Discharge status: Home / Self Care | Attending: Family Medicine | Admitting: Family Medicine

## 2023-12-31 ENCOUNTER — Ambulatory Visit (INDEPENDENT_AMBULATORY_CARE_PROVIDER_SITE_OTHER)

## 2023-12-31 ENCOUNTER — Other Ambulatory Visit: Payer: Self-pay

## 2023-12-31 ENCOUNTER — Ambulatory Visit: Payer: Self-pay | Admitting: Nurse Practitioner

## 2023-12-31 DIAGNOSIS — S63501A Unspecified sprain of right wrist, initial encounter: Secondary | ICD-10-CM | POA: Diagnosis not present

## 2023-12-31 NOTE — ED Triage Notes (Signed)
 Pt fell backwards at home three days ago and injured right wrist. Pt states he hit his neck when he fell, but denies neck pain. Pt is on eliquis . Pt denies hitting head ans states he landed on his left back. Pt has 2+ right radial pulse, cap refill less than 3 sec, warm to touch.

## 2023-12-31 NOTE — Discharge Instructions (Signed)
 You may use a wrist brace to help support the wrist.  You may elevate and ice as needed.  You may continue your tramadol  as needed.  Follow-up with your PCP 1 week if your symptoms do not improve as you may need additional workup or imaging.  Please go to the ER for any worsening symptoms.  Hope you feel better soon!

## 2023-12-31 NOTE — ED Provider Notes (Signed)
 [facility]    CSN: [id number] Arrival date & time: 12/31/23  1333      History   Chief Complaint No chief complaint on file.   HPI [name] is a 78 y.o. male presents for wrist pain.  Patient states 3 nights ago while getting up in [place] he fell backwards onto an outstretched right wrist.  He states he did not hit his head as a trash can broke his fall.  No LOC.  He reports pain and swelling to the right wrist but denies bruising or numbness or tingling.  No history of injuries or fractures to the wrist in the past.  He is on Eliquis  for his A-fib.  He did state he had some left low back pain that is now resolved secondary to the trash can.  No hematuria.  No neck or upper back pain.  No other concerns at this time  HPI  Past Medical History:  Diagnosis Date   Anemia 07/29/2013   Asthma    childhood asthma   Benign essential HTN    Benign prostatic hypertrophy    several prostate biopsies neg for cancer.    Bilateral hip pain    Cancer (HCC)    CHF (congestive heart failure) (HCC)    Chronic anticoagulation 07/22/2013   Complication of anesthesia    MORPHINE CAUSES HALLUCINATIONS   CVA (cerebral vascular accident) (HCC) 06/2016   Depression    Diabetes mellitus without complication (HCC)    Diastolic congestive heart failure (HCC) 07/16/2013   Dysphagia, post-stroke    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   FH: colonic polyps 2010   Gout    History of kidney stones    Hypertension    Kidney cysts    Morbid obesity (HCC)    Numbness    feet / legs   PAF (paroxysmal atrial fibrillation) with RVR 07/29/2013   Pericarditis 2015   s/p window   Recurrent pulmonary emboli (HCC) 06/13/2015   Sleep apnea    USES C PAP   Stroke syndrome    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    Patient Active Problem List   Diagnosis Date Noted   Acute right-sided low back pain with right-sided sciatica 09/02/2023   Chronic pain syndrome 09/02/2023   PAH  (pulmonary artery hypertension) (HCC) 07/25/2023   Chronic seborrheic dermatitis 07/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   Bradycardia on ECG 06/21/2023   Anemia due to zinc  deficiency 05/29/2021   Chronic heart failure with preserved ejection fraction (HCC) 05/28/2021   Chronic respiratory failure with hypoxia (HCC) 03/22/2021   Atherosclerosis of aorta 11/10/2020   Insomnia 09/27/2020   Type II diabetes mellitus with manifestations (HCC) 11/14/2018   B12 deficiency 05/16/2018   Lumbosacral spondylosis without myelopathy 12/18/2017   Vocal fold paralysis, 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   Benign essential HTN    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   OSA (obstructive sleep apnea)    Dysphagia, post-stroke    Neurologic gait disorder    Encounter for monitoring flecainide  therapy 04/28/2016   Recurrent pulmonary emboli (HCC) 06/13/2015   GERD (gastroesophageal reflux disease) 06/13/2015   Basal cell carcinoma of skin 06/28/2014   Periodic limb movement sleep disorder 06/19/2014   PAF (paroxysmal atrial fibrillation) (HCC) 07/29/2013   Chronic anticoagulation 07/22/2013   Diastolic congestive heart failure (HCC) 07/16/2013   Morbid obesity (HCC) 08/09/2012   Peripheral neuropathy (HCC) 11/02/2009   Hyperlipidemia with target LDL less than 130 09/10/2008   GOUT 06/28/2006  BPH (benign prostatic hyperplasia) 06/28/2006    Past Surgical History:  Procedure Laterality Date   ARTHROSCOPY KNEE W/ DRILLING     BREATH TEK H PYLORI N/A 12/23/2012   Procedure: BREATH TEK H PYLORI;  Surgeon: Camellia CHRISTELLA Blush, MD;  Location: [facility];  Service: General;  Laterality: N/A;   CHOLECYSTECTOMY  1989   COLONOSCOPY N/A 07/31/2013   Procedure: COLONOSCOPY;  Surgeon: Lupita FORBES Commander, MD;  Location: [facility] ENDOSCOPY;  Service: Endoscopy;  Laterality: N/A;   ESOPHAGOGASTRODUODENOSCOPY N/A 07/22/2013   Procedure: ESOPHAGOGASTRODUODENOSCOPY (EGD);  Surgeon: Norleen LOISE Kiang, MD;  Location: [facility] ENDOSCOPY;  Service:  Endoscopy;  Laterality: N/A;   ESOPHAGOGASTRODUODENOSCOPY N/A 07/31/2013   Procedure: ESOPHAGOGASTRODUODENOSCOPY (EGD);  Surgeon: Lupita FORBES Commander, MD;  Location: [facility] ENDOSCOPY;  Service: Endoscopy;  Laterality: N/A;   INTRAOPERATIVE TRANSESOPHAGEAL ECHOCARDIOGRAM N/A 07/18/2013   Procedure: INTRAOPERATIVE TRANSESOPHAGEAL ECHOCARDIOGRAM;  Surgeon: Dallas KATHEE Jude, MD;  Location: [facility] OR;  Service: Open Heart Surgery;  Laterality: N/A;   LAPAROSCOPIC GASTRIC BANDING WITH HIATAL HERNIA REPAIR N/A 04/08/2013   Procedure: LAPAROSCOPIC GASTRIC BANDING WITH possible  HIATAL HERNIA REPAIR;  Surgeon: Camellia CHRISTELLA Blush, MD;  Location: [facility] ORS;  Service: General;  Laterality: N/A;   LITHOTRIPSY     renal calculi     RIGHT AND LEFT HEART CATH N/A 05/12/2021   Procedure: RIGHT AND LEFT HEART CATH;  Surgeon: Cherrie Toribio SAUNDERS, MD;  Location: [facility] INVASIVE CV LAB;  Service: Cardiovascular;  Laterality: N/A;   SUBXYPHOID PERICARDIAL WINDOW N/A 07/18/2013   Procedure: SUBXYPHOID PERICARDIAL WINDOW;  Surgeon: Dallas KATHEE Jude, MD;  Location: [facility] OR;  Service: Thoracic;  Laterality: N/A;   TONSILLECTOMY     total knee replacment     bilat   trigger finger repair     also lue, cts surgically repaired   WISDOM TOOTH EXTRACTION         Home Medications    Prior to Admission medications   Medication Sig Start Date End Date Taking? Authorizing Provider  allopurinol  (ZYLOPRIM ) 100 MG tablet Take 1 tablet (100 mg total) by mouth daily. 07/25/23   [name], MD  apixaban  (ELIQUIS ) 5 MG TABS tablet TAKE 1 TABLET(5 MG) BY MOUTH TWICE DAILY 11/15/23   [name], MD  atorvastatin  (LIPITOR ) 80 MG tablet TAKE 1 TABLET(80 MG) BY MOUTH DAILY 09/10/23   [name], MD  carboxymethylcellulose (REFRESH PLUS) 0.5 % SOLN Place 1 drop into both eyes 3 (three) times daily as needed (dry eyes).    [provider]  ciclopirox  (LOPROX ) 0.77 % cream Apply topically 2 (two) times daily. 07/25/23   [name], MD  [facility]S  MELATONIN GUMMIES PO Take 5 mg by mouth at bedtime.    [provider]  Docusate Sodium  (DSS) 100 MG CAPS Take 100 mg by mouth every other day. 07/25/13   Zamora, Ezequiel, MD  empagliflozin  (JARDIANCE ) 10 MG TABS tablet TAKE 1 TABLET(10 MG) BY MOUTH DAILY BEFORE BREAKFAST 09/07/23   [name], Vinie BROCKS, MD  finasteride  (PROSCAR ) 5 MG tablet Take 1 tablet (5 mg total) by mouth daily. 07/12/16   Angiulli, Toribio PARAS, PA-C  flecainide  (TAMBOCOR ) 50 MG tablet TAKE 1 TABLET(50 MG) BY MOUTH EVERY 12 HOURS 09/07/23   [name], Vinie BROCKS, MD  LORazepam  (ATIVAN ) 0.5 MG tablet Take 1 tablet (0.5 mg total) by mouth at bedtime. 12/17/23   Arfeen, Leni DASEN, MD  methylPREDNISolone  (MEDROL  DOSEPAK) 4 MG TBPK tablet Use as directed. 08/31/23   Alvia Krabbe  L, FNP  metoprolol  succinate (TOPROL  XL) 25 MG 24 hr tablet Take 0.5 tablets (12.5 mg total) by mouth daily. 07/20/23   West, Katlyn D, NP  montelukast  (SINGULAIR ) 10 MG tablet TAKE 1 TABLET(10 MG) BY MOUTH AT BEDTIME 09/10/23   [name], MD  MOUNJARO  10 MG/0.5ML Pen ADMINISTER 10 MG UNDER THE SKIN 1 TIME A WEEK 12/04/23   [name], MD  Multiple Vitamin (MULTIVITAMIN WITH MINERALS) TABS tablet Take 1 tablet by mouth daily.    [provider]  pantoprazole  (PROTONIX ) 40 MG tablet TAKE 1 TABLET(40 MG) BY MOUTH DAILY 09/10/23   [name], MD  potassium chloride  SA (KLOR-CON  M) 20 MEQ tablet TAKE 1 TABLET(20 MEQ) BY MOUTH TWICE DAILY 09/07/23   [name], Vinie BROCKS, MD  rOPINIRole  (REQUIP ) 0.5 MG tablet TAKE 1 TABLET(0.5 MG) BY MOUTH THREE TIMES DAILY 03/30/23   Neysa Rama D, MD  sertraline  (ZOLOFT ) 100 MG tablet Take 2 tablets (200 mg total) by mouth at bedtime. 12/17/23   Arfeen, Leni DASEN, MD  torsemide  (DEMADEX ) 20 MG tablet TAKE 3 TABLETS BY MOUTH EVERY DAY 09/07/23   [name], Vinie BROCKS, MD  traMADol  (ULTRAM ) 50 MG tablet Take 1 tablet (50 mg total) by mouth 3 (three) times daily as needed for moderate pain (pain score 4-6). 11/06/23   Debby Fidela CROME,  NP  zinc  gluconate 50 MG tablet Take 1 tablet (50 mg total) by mouth daily. 10/10/22   [name], MD    Family History Family History  Problem Relation Age of Onset   Depression Mother    Hypertension Mother    Dementia Mother    Heart disease Father        MI   Depression Brother    Stroke Paternal Aunt        CVA   Diabetes Paternal Uncle    Heart disease Paternal Uncle        MI   Heart disease Paternal Grandmother        MI   Diabetes Cousin        PATERNAL    Social History Social History   Tobacco Use   Smoking status: Former    Current packs/day: 0.00    Average packs/day: 0.2 packs/day for 13.4 years (2.7 ttl pk-yrs)    Types: Cigarettes    Start date: 04/03/1961    Quit date: 08/30/1974    Years since quitting: 49.3   Smokeless tobacco: Never  Vaping Use   Vaping status: Never Used  Substance Use Topics   Alcohol  use: No    Alcohol /week: 0.0 standard drinks of alcohol    Drug use: No     Allergies   Morphine, Penicillins, Codeine, and Propoxyphene n-acetaminophen    Review of Systems Review of Systems  Musculoskeletal:        Right wrist pain     Physical Exam Triage Vital Signs ED Triage Vitals  Encounter Vitals Group     BP 12/31/23 1514 102/68     Girls Systolic BP Percentile --      Girls Diastolic BP Percentile --      Boys Systolic BP Percentile --      Boys Diastolic BP Percentile --      Pulse Rate 12/31/23 1514 66     Resp 12/31/23 1514 17     Temp 12/31/23 1514 98.2 F (36.8 C)     Temp Source 12/31/23 1514 Oral     SpO2 12/31/23 1514 96 %  Weight --      Height --      Head Circumference --      Peak Flow --      Pain Score 12/31/23 1510 5     Pain Loc --      Pain Education --      Exclude from Growth Chart --    No data found.  Updated Vital Signs BP 102/68   Pulse 66   Temp 98.2 F (36.8 C) (Oral)   Resp 17   SpO2 96%   Visual Acuity Right Eye Distance:   Left Eye Distance:   Bilateral Distance:     Right Eye Near:   Left Eye Near:    Bilateral Near:     Physical Exam Vitals and nursing note reviewed.  Constitutional:      General: He is not in acute distress.    Appearance: Normal appearance. He is obese. He is not ill-appearing.  HENT:     Head: Normocephalic and atraumatic.  Eyes:     Pupils: Pupils are equal, round, and reactive to light.  Cardiovascular:     Rate and Rhythm: Normal rate.  Pulmonary:     Effort: Pulmonary effort is normal.  Musculoskeletal:     Right wrist: Swelling and tenderness present. No deformity, effusion, lacerations, bony tenderness, snuff box tenderness or crepitus. Normal range of motion. Normal pulse.     Comments: Mildly tender over the dorsal aspect of the right wrist without bruising.  Full range of motion of wrist with pain on flexion and extension.  No tenderness over hand or forearm.  Radial pulse +2.  There is no left-sided bruising or swelling of the mid to low back.  No CVAT.  Area is nontender to palpation  Skin:    General: Skin is warm and dry.  Neurological:     General: No focal deficit present.     Mental Status: He is alert and oriented to person, place, and time.  Psychiatric:        Mood and Affect: Mood normal.        Behavior: Behavior normal.      UC Treatments / Results  Labs (all labs ordered are listed, but only abnormal results are displayed) Labs Reviewed - No data to display  EKG   Radiology DG Wrist Complete Right Result Date: 12/31/2023 CLINICAL DATA:  Trauma to the right wrist. EXAM: RIGHT WRIST - COMPLETE 3+ VIEW COMPARISON:  None Available. FINDINGS: No acute fracture or dislocation. The bones are osteopenic. There is mild diffuse subcutaneous edema. No radiopaque foreign object or soft tissue gas. IMPRESSION: 1. No acute fracture or dislocation. 2. Mild diffuse subcutaneous edema. Electronically Signed   By: Vanetta Chou M.D.   On: 12/31/2023 15:51    Procedures Procedures (including  critical care time)  Medications Ordered in UC Medications - No data to display  Initial Impression / Assessment and Plan / UC Course  I have reviewed the triage vital signs and the nursing notes.  Pertinent labs & imaging results that were available during my care of the patient were reviewed by me and considered in my medical decision making (see chart for details).     Reviewed exam and symptoms with patient.  X-ray negative for fracture.  No bruising of low back or tenderness to area.  Discussed wrist sprain.  Velcro wrist brace applied and discussed RICE therapy.  He already takes tramadol  for pain and can continue.  No NSAIDs due  to Eliquis .  PCP follow-up 1 week if symptoms do not improve.  ER precautions reviewed. Final Clinical Impressions(s) / UC Diagnoses   Final diagnoses:  Sprain of right wrist, initial encounter   Discharge Instructions   None    ED Prescriptions   None    PDMP not reviewed this encounter.   Loreda Myla SAUNDERS, NP 12/31/23 [phone]

## 2024-01-11 ENCOUNTER — Encounter: Admitting: Physical Medicine & Rehabilitation

## 2024-01-12 ENCOUNTER — Other Ambulatory Visit: Payer: Self-pay | Admitting: Internal Medicine

## 2024-01-12 DIAGNOSIS — M1A09X0 Idiopathic chronic gout, multiple sites, without tophus (tophi): Secondary | ICD-10-CM

## 2024-01-17 ENCOUNTER — Ambulatory Visit: Admitting: Registered Nurse

## 2024-01-31 ENCOUNTER — Ambulatory Visit: Admitting: Pulmonary Disease

## 2024-02-14 ENCOUNTER — Ambulatory Visit: Admitting: Physical Medicine & Rehabilitation

## 2024-02-15 ENCOUNTER — Ambulatory Visit: Admitting: Physical Medicine & Rehabilitation

## 2024-02-17 ENCOUNTER — Encounter: Payer: Self-pay | Admitting: Internal Medicine

## 2024-02-23 ENCOUNTER — Other Ambulatory Visit: Payer: Self-pay | Admitting: Internal Medicine

## 2024-02-23 DIAGNOSIS — E785 Hyperlipidemia, unspecified: Secondary | ICD-10-CM

## 2024-02-25 ENCOUNTER — Telehealth: Payer: Self-pay

## 2024-02-25 NOTE — Telephone Encounter (Signed)
 Please advise. You can respond here and I will call the patient back or you could respond back VIA MY CHART.

## 2024-02-25 NOTE — Telephone Encounter (Signed)
 Copied from CRM [id number]. Topic: Clinical - Medication Question >> Feb 25, 2024 11:05 AM Mercedes MATSU wrote: Reason for CRM: Patient states that he sent a MyChart message in regards to having some questions about his Mounjauro medication.   The message on mychart states:   17 February 2024. Dr Joshua, I started taking Mournjaro 5 mg in November 2024 to help with my weight loss. In March 2025 you increased to 7.5 mg. In June 2025 you increased to 10mg .   My heaviest weight in my life was 404 lbs.  When i I began taking Mournjaro I weighed 357.4 lbs. and I currently weigh 317.6 lbs.  I seem to have reached a weight plateau. Since I began taking Mournjaro I have lost nearly 40 pounds but my first weight loss goal is to weigh less than 300 pounds.  Would it be of any help to my goal to increase the dosage of Mournjaro or am I at the best dosage already?  Thank you for your answer to this question.   Best Regards, [name]  He states he would like a response to the fpl group or a phone call from the nurse before the refill is due. Patient is requesting a call back to [phone].

## 2024-02-26 ENCOUNTER — Encounter: Payer: Self-pay | Admitting: Physical Medicine & Rehabilitation

## 2024-02-26 ENCOUNTER — Encounter: Attending: Physical Medicine & Rehabilitation | Admitting: Physical Medicine & Rehabilitation

## 2024-02-26 VITALS — BP 127/76 | HR 66 | Ht 72.0 in | Wt 329.0 lb

## 2024-02-26 DIAGNOSIS — M259 Joint disorder, unspecified: Secondary | ICD-10-CM | POA: Insufficient documentation

## 2024-02-26 NOTE — Patient Instructions (Signed)
  VISIT SUMMARY: Today, you had a follow-up appointment after your sacroiliac injection. You reported significant improvement in pain and discussed your current pain management and recent fall.  YOUR PLAN: RIGHT LOW BACK AND BUTTOCKS PAIN: You have pain due to lumbar degenerative disc disease with right-sided sciatica. -Continue taking tramadol  as needed, two to three times daily. -No additional sacroiliac injection is needed at this time. -Follow-up with Fidela in three months.  RIGHT FOOT DROP: You have foot drop on one side, which you manage with diabetic shoes and an ankle brace. -Continue using diabetic shoes and ankle brace.                      Contains text generated by [employer].                                 Contains text generated by [employer].

## 2024-02-26 NOTE — Progress Notes (Signed)
 10/11/2023 Right sacroiliac injection under fluoroscopic guidance  Indication: Right Low back and buttocks pain not relieved by medication management and other conservative care.  Informed consent was obtained after describing risks and benefits of the procedure with the patient, this includes bleeding, bruising, infection, paralysis and medication side effects. The patient wishes to proceed and has given written consent. The patient was placed in a prone position. The lumbar and sacral area was marked and prepped with Betadine. A 25-gauge 1-1/2 inch needle was inserted into the skin and subcutaneous tissue and 1 mL of 1% lidocaine  was injected. Then a 25-gauge 3.5 inch spinal needle was inserted under fluoroscopic guidance into the Right sacroiliac joint. AP and lateral images were utilized. Omnipaque  180x0.5 mL under live fluoroscopy demonstrated no intravascular uptake. Then a solution containing one ML of 6 mgper ml betamethasone  and 2 ML of 1% lidocaine  MPF was injected x1.5 mL. Patient tolerated the procedure well. Post procedure instructions were given. Please see post procedure form.   Subjective:    Patient ID: [name], male    DOB: December 12, 1945, 78 y.o.   MRN: [id number]  HPI Discussed the use of AI scribe software for clinical note transcription with the patient, who gave verbal consent to proceed.  History of Present Illness [name] is a 78 year old male who presents for follow-up after a sacroiliac injection.  He has experienced significant improvement in pain following the sacroiliac injection performed in July, and reports that the effect has not worn off yet. He continues to manage pain with tramadol , taking it twice daily and increasing to three times if pain intensifies. He also takes lorazepam  at night without experiencing dizziness or other issues.  Recently, he experienced a fall resulting in a sprained wrist. An x-ray confirmed there was no fracture, and  the wrist pain has been improving.  He uses a cane or rollator for mobility within the house and has a motorized wheelchair for outside use, which he keeps in his vehicle. He has strategically placed chairs around the house to assist with mobility due to difficulty standing for long periods, attributed to his degenerative disc disease.  He wears an ankle brace and diabetic shoes due to foot drop on one side, which he describes as a prolapse at the ankle.   Pain Inventory Average Pain 6 Pain Right Now 5 My pain is intermittent, sharp, stabbing, and aching  In the last 24 hours, has pain interfered with the following? General activity 5 Relation with others 5 Enjoyment of life 5 What TIME of day is your pain at its worst? varies Sleep (in general) Good  Pain is worse with: walking, bending, sitting, and unsure Pain improves with: medication Relief from Meds: 4  Family History  Problem Relation Age of Onset   Depression Mother    Hypertension Mother    Dementia Mother    Heart disease Father        MI   Depression Brother    Stroke Paternal Aunt        CVA   Diabetes Paternal Uncle    Heart disease Paternal Uncle        MI   Heart disease Paternal Grandmother        MI   Diabetes Cousin        PATERNAL   Social History   Socioeconomic History   Marital status: Married    Spouse name: [name]   Number of children: 1   Years  of education: Not on file   Highest education level: Bachelor's degree (e.g., BA, AB, BS)  Occupational History   Occupation: retired-Manager [employer]  Tobacco Use   Smoking status: Former    Current packs/day: 0.00    Average packs/day: 0.2 packs/day for 13.4 years (2.7 ttl pk-yrs)    Types: Cigarettes    Start date: 04/03/1961    Quit date: 08/30/1974    Years since quitting: 49.5   Smokeless tobacco: Never  Vaping Use   Vaping status: Never Used  Substance and Sexual Activity   Alcohol  use: No    Alcohol /week: 0.0 standard drinks of  alcohol    Drug use: No   Sexual activity: Not Currently  Other Topics Concern   Not on file  Social History Narrative   Lives at home w/ his wife   Right-handed   Caffeine: decaf coffee, occasional tea   Social Drivers of Corporate Investment Banker Strain: Medium Risk (08/30/2023)   Overall Financial Resource Strain (CARDIA)    Difficulty of Paying Living Expenses: Somewhat hard  Food Insecurity: No Food Insecurity (08/30/2023)   Hunger Vital Sign    Worried About Running Out of Food in the Last Year: Never true    Ran Out of Food in the Last Year: Never true  Transportation Needs: No Transportation Needs (08/30/2023)   PRAPARE - Administrator, Civil Service (Medical): No    Lack of Transportation (Non-Medical): No  Physical Activity: Inactive (08/30/2023)   Exercise Vital Sign    Days of Exercise per Week: 0 days    Minutes of Exercise per Session: 0 min  Stress: Stress Concern Present (08/30/2023)   [employer] of Occupational Health - Occupational Stress Questionnaire    Feeling of Stress : To some extent  Social Connections: Moderately Integrated (08/30/2023)   Social Connection and Isolation Panel    Frequency of Communication with Friends and Family: Three times a week    Frequency of Social Gatherings with Friends and Family: Once a week    Attends Religious Services: 1 to 4 times per year    Active Member of [employer] or Organizations: No    Attends Banker Meetings: Never    Marital Status: Married  Recent Concern: Social Connections - Moderately Isolated (06/22/2023)   Social Connection and Isolation Panel    Frequency of Communication with Friends and Family: More than three times a week    Frequency of Social Gatherings with Friends and Family: Never    Attends Religious Services: Never    Database Administrator or Organizations: No    Attends Engineer, Structural: Never    Marital Status: Married   Past Surgical History:   Procedure Laterality Date   ARTHROSCOPY KNEE W/ DRILLING     BREATH TEK H PYLORI N/A 12/23/2012   Procedure: BREATH TEK H PYLORI;  Surgeon: Camellia CHRISTELLA Blush, MD;  Location: [facility];  Service: General;  Laterality: N/A;   CHOLECYSTECTOMY  1989   COLONOSCOPY N/A 07/31/2013   Procedure: COLONOSCOPY;  Surgeon: Lupita FORBES Commander, MD;  Location: [facility] ENDOSCOPY;  Service: Endoscopy;  Laterality: N/A;   ESOPHAGOGASTRODUODENOSCOPY N/A 07/22/2013   Procedure: ESOPHAGOGASTRODUODENOSCOPY (EGD);  Surgeon: Norleen LOISE Kiang, MD;  Location: [name] ENDOSCOPY;  Service: Endoscopy;  Laterality: N/A;   ESOPHAGOGASTRODUODENOSCOPY N/A 07/31/2013   Procedure: ESOPHAGOGASTRODUODENOSCOPY (EGD);  Surgeon: Lupita FORBES Commander, MD;  Location: [facility] ENDOSCOPY;  Service: Endoscopy;  Laterality: N/A;   INTRAOPERATIVE TRANSESOPHAGEAL ECHOCARDIOGRAM N/A 07/18/2013  Procedure: INTRAOPERATIVE TRANSESOPHAGEAL ECHOCARDIOGRAM;  Surgeon: Dallas KATHEE Jude, MD;  Location: [facility] OR;  Service: Open Heart Surgery;  Laterality: N/A;   LAPAROSCOPIC GASTRIC BANDING WITH HIATAL HERNIA REPAIR N/A 04/08/2013   Procedure: LAPAROSCOPIC GASTRIC BANDING WITH possible  HIATAL HERNIA REPAIR;  Surgeon: Camellia CHRISTELLA Blush, MD;  Location: [facility] ORS;  Service: General;  Laterality: N/A;   LITHOTRIPSY     renal calculi     RIGHT AND LEFT HEART CATH N/A 05/12/2021   Procedure: RIGHT AND LEFT HEART CATH;  Surgeon: Cherrie Toribio SAUNDERS, MD;  Location: [facility] CV LAB;  Service: Cardiovascular;  Laterality: N/A;   SUBXYPHOID PERICARDIAL WINDOW N/A 07/18/2013   Procedure: SUBXYPHOID PERICARDIAL WINDOW;  Surgeon: Dallas KATHEE Jude, MD;  Location: [facility] OR;  Service: Thoracic;  Laterality: N/A;   TONSILLECTOMY     total knee replacment     bilat   trigger finger repair     also lue, cts surgically repaired   WISDOM TOOTH EXTRACTION     Past Surgical History:  Procedure Laterality Date   ARTHROSCOPY KNEE W/ DRILLING     BREATH TEK H PYLORI N/A 12/23/2012   Procedure: BREATH TEK VEAR LORA;   Surgeon: Camellia CHRISTELLA Blush, MD;  Location: [facility];  Service: General;  Laterality: N/A;   CHOLECYSTECTOMY  1989   COLONOSCOPY N/A 07/31/2013   Procedure: COLONOSCOPY;  Surgeon: Lupita FORBES Commander, MD;  Location: [facility] ENDOSCOPY;  Service: Endoscopy;  Laterality: N/A;   ESOPHAGOGASTRODUODENOSCOPY N/A 07/22/2013   Procedure: ESOPHAGOGASTRODUODENOSCOPY (EGD);  Surgeon: Norleen LOISE Kiang, MD;  Location[facility] ENDOSCOPY;  Service: Endoscopy;  Laterality: N/A;   ESOPHAGOGASTRODUODENOSCOPY N/A 07/31/2013   Procedure: ESOPHAGOGASTRODUODENOSCOPY (EGD);  Surgeon: Lupita FORBES Commander, MD;  Location: [facility] ENDOSCOPY;  Service: Endoscopy;  Laterality: N/A;   INTRAOPERATIVE TRANSESOPHAGEAL ECHOCARDIOGRAM N/A 07/18/2013   Procedure: INTRAOPERATIVE TRANSESOPHAGEAL ECHOCARDIOGRAM;  Surgeon: Dallas KATHEE Jude, MD;  Location: [facility] OR;  Service: Open Heart Surgery;  Laterality: N/A;   LAPAROSCOPIC GASTRIC BANDING WITH HIATAL HERNIA REPAIR N/A 04/08/2013   Procedure: LAPAROSCOPIC GASTRIC BANDING WITH possible  HIATAL HERNIA REPAIR;  Surgeon: Camellia CHRISTELLA Blush, MD;  Location: [facility]S;  Service: General;  Laterality: N/A;   LITHOTRIPSY     renal calculi     RIGHT AND LEFT HEART CATH N/A 05/12/2021   Procedure: RIGHT AND LEFT HEART CATH;  Surgeon: Cherrie Toribio SAUNDERS, MD;  Location: [facility];  Service: Cardiovascular;  Laterality: N/A;   SUBXYPHOID PERICARDIAL WINDOW N/A 07/18/2013   Procedure: SUBXYPHOID PERICARDIAL WINDOW;  Surgeon: Dallas KATHEE Jude, MD;  Location: [facility] OR;  Service: Thoracic;  Laterality: N/A;   TONSILLECTOMY     total knee replacment     bilat   trigger finger repair     also lue, cts surgically repaired   WISDOM TOOTH EXTRACTION     Past Medical History:  Diagnosis Date   Anemia 07/29/2013   Asthma    childhood asthma   Benign essential HTN    Benign prostatic hypertrophy    several prostate biopsies neg for cancer.    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   FH: colonic polyps 2010   Gout    History of kidney stones    Hypertension    Kidney cysts    Morbid obesity (HCC)    Numbness    feet / legs   PAF (paroxysmal atrial fibrillation) with RVR 07/29/2013   Pericarditis 2015   s/p window   Recurrent pulmonary emboli (HCC) 06/13/2015   Sleep apnea    USES C PAP   Stroke syndrome    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   BP 127/76 (BP Location: Left Arm, Patient Position: Sitting, Cuff Size: Large)   Pulse 66   Ht 6' (1.829 m)   Wt (!) 329 lb (149.2 kg)   SpO2 97%   BMI 44.62 kg/m   Opioid Risk Score:   Fall Risk Score:  `1  Depression screen [facility] 2/9     11/06/2023   12:51 PM 10/11/2023    3:28 PM 09/18/2023    2:58 PM 04/30/2023    2:45 PM 01/29/2023    1:31 PM 10/10/2022    3:04 PM 06/19/2022    2:24 PM  Depression screen PHQ 2/9  Decreased Interest 0 0 0 0 0 0 0  Down, Depressed, Hopeless 0 0 0 0 0 0 0  PHQ - 2 Score 0 0 0 0 0 0 0  Altered sleeping    0 1 0   Tired, decreased energy    0 1 0   Change in appetite    0 0 0   Feeling bad or failure about yourself     0 0 0   Trouble concentrating    1 0 0   Moving slowly or fidgety/restless    0 0 0   Suicidal thoughts    0 0 0   PHQ-9 Score    1  2  0    Difficult doing work/chores    Not difficult at all Somewhat difficult       Data saved with a previous flowsheet row definition      Review of Systems  Musculoskeletal:  Positive for arthralgias and back pain.       Low back pain, right wrist pain       Objective:   Physical Exam Obese male no acute distress Mood affect appropriate Speech without dysarthria or aphasia No evidence of  field cut Motor strength is 5/5 bilateral hip flexors knee extensors ankle dorsiflexor.   Standing balance is fair  Lumbar range of motion is limited gets to neutral with extension flexion is only 25% normal. Negative straight leg raising bilaterally No tenderness over the PSIS area there is tenderness over the lumbar paraspinal area around L4 and L5.        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 Chronic pain well-managed with tramadol . No additional sacroiliac injection needed. Pain increases when standing and leaning forward. - Continue tramadol  as needed, two to three times daily. - Follow-up with Fidela Ned, nurse practitioner in three months.  If doing well can extend out to every 20-month visits.  If pain increasing in the sacral area would repeat sacroiliac injection  Left foot drop Managed with diabetic shoes and ankle brace. - Continue use of diabetic shoes and ankle brace.

## 2024-02-27 ENCOUNTER — Ambulatory Visit: Admitting: Pulmonary Disease

## 2024-02-27 ENCOUNTER — Telehealth: Payer: Self-pay

## 2024-02-27 NOTE — Telephone Encounter (Signed)
 Copied from CRM #[id number]. Topic: Clinical - Medication Question >> Feb 27, 2024 10:29 AM Dedra B wrote: Reason for CRM: Pt said he sent a message via MyChart 02/17/24 and  called on 02/25/24 regarding increasing his Mounjaro  dosage. Pt has not heard anything back. Pls call pt or respond in MyChart.

## 2024-03-01 ENCOUNTER — Other Ambulatory Visit: Payer: Self-pay | Admitting: Internal Medicine

## 2024-03-01 DIAGNOSIS — E118 Type 2 diabetes mellitus with unspecified complications: Secondary | ICD-10-CM

## 2024-03-04 NOTE — Telephone Encounter (Signed)
Duplicate message, please see other encounter.

## 2024-03-06 ENCOUNTER — Other Ambulatory Visit: Payer: Self-pay | Admitting: Internal Medicine

## 2024-03-06 DIAGNOSIS — K2100 Gastro-esophageal reflux disease with esophagitis, without bleeding: Secondary | ICD-10-CM

## 2024-03-06 DIAGNOSIS — H1010 Acute atopic conjunctivitis, unspecified eye: Secondary | ICD-10-CM

## 2024-03-06 DIAGNOSIS — I480 Paroxysmal atrial fibrillation: Secondary | ICD-10-CM

## 2024-03-11 NOTE — Telephone Encounter (Signed)
 Patient has been scheduled

## 2024-03-18 ENCOUNTER — Ambulatory Visit: Admitting: Internal Medicine

## 2024-03-29 ENCOUNTER — Other Ambulatory Visit: Payer: Self-pay | Admitting: Internal Medicine

## 2024-03-29 DIAGNOSIS — E118 Type 2 diabetes mellitus with unspecified complications: Secondary | ICD-10-CM

## 2024-04-22 ENCOUNTER — Ambulatory Visit: Payer: Self-pay | Admitting: Internal Medicine

## 2024-04-22 ENCOUNTER — Ambulatory Visit: Admitting: Internal Medicine

## 2024-04-22 ENCOUNTER — Encounter: Payer: Self-pay | Admitting: Internal Medicine

## 2024-04-22 VITALS — BP 126/66 | HR 76 | Temp 98.3°F | Ht 72.0 in | Wt 303.6 lb

## 2024-04-22 DIAGNOSIS — E876 Hypokalemia: Secondary | ICD-10-CM | POA: Diagnosis not present

## 2024-04-22 DIAGNOSIS — E785 Hyperlipidemia, unspecified: Secondary | ICD-10-CM | POA: Diagnosis not present

## 2024-04-22 DIAGNOSIS — E118 Type 2 diabetes mellitus with unspecified complications: Secondary | ICD-10-CM

## 2024-04-22 DIAGNOSIS — I480 Paroxysmal atrial fibrillation: Secondary | ICD-10-CM | POA: Diagnosis not present

## 2024-04-22 DIAGNOSIS — J9611 Chronic respiratory failure with hypoxia: Secondary | ICD-10-CM | POA: Diagnosis not present

## 2024-04-22 DIAGNOSIS — E60 Dietary zinc deficiency: Secondary | ICD-10-CM

## 2024-04-22 DIAGNOSIS — M1A09X0 Idiopathic chronic gout, multiple sites, without tophus (tophi): Secondary | ICD-10-CM

## 2024-04-22 DIAGNOSIS — I5032 Chronic diastolic (congestive) heart failure: Secondary | ICD-10-CM

## 2024-04-22 DIAGNOSIS — E11610 Type 2 diabetes mellitus with diabetic neuropathic arthropathy: Secondary | ICD-10-CM | POA: Insufficient documentation

## 2024-04-22 DIAGNOSIS — I2721 Secondary pulmonary arterial hypertension: Secondary | ICD-10-CM | POA: Diagnosis not present

## 2024-04-22 DIAGNOSIS — T502X5A Adverse effect of carbonic-anhydrase inhibitors, benzothiadiazides and other diuretics, initial encounter: Secondary | ICD-10-CM | POA: Diagnosis not present

## 2024-04-22 DIAGNOSIS — I10 Essential (primary) hypertension: Secondary | ICD-10-CM

## 2024-04-22 DIAGNOSIS — R9431 Abnormal electrocardiogram [ECG] [EKG]: Secondary | ICD-10-CM | POA: Insufficient documentation

## 2024-04-22 DIAGNOSIS — I110 Hypertensive heart disease with heart failure: Secondary | ICD-10-CM | POA: Diagnosis not present

## 2024-04-22 DIAGNOSIS — E538 Deficiency of other specified B group vitamins: Secondary | ICD-10-CM

## 2024-04-22 DIAGNOSIS — G4733 Obstructive sleep apnea (adult) (pediatric): Secondary | ICD-10-CM

## 2024-04-22 LAB — CBC WITH DIFFERENTIAL/PLATELET
Basophils Absolute: 0.1 K/uL (ref 0.0–0.1)
Basophils Relative: 0.5 % (ref 0.0–3.0)
Eosinophils Absolute: 0.1 K/uL (ref 0.0–0.7)
Eosinophils Relative: 0.8 % (ref 0.0–5.0)
HCT: 40 % (ref 39.0–52.0)
Hemoglobin: 13.5 g/dL (ref 13.0–17.0)
Lymphocytes Relative: 14.8 % (ref 12.0–46.0)
Lymphs Abs: 1.6 K/uL (ref 0.7–4.0)
MCHC: 33.6 g/dL (ref 30.0–36.0)
MCV: 91 fl (ref 78.0–100.0)
Monocytes Absolute: 1 K/uL (ref 0.1–1.0)
Monocytes Relative: 9 % (ref 3.0–12.0)
Neutro Abs: 8.1 K/uL — ABNORMAL HIGH (ref 1.4–7.7)
Neutrophils Relative %: 74.9 % (ref 43.0–77.0)
Platelets: 205 K/uL (ref 150.0–400.0)
RBC: 4.4 Mil/uL (ref 4.22–5.81)
RDW: 14.5 % (ref 11.5–15.5)
WBC: 10.8 K/uL — ABNORMAL HIGH (ref 4.0–10.5)

## 2024-04-22 LAB — HEPATIC FUNCTION PANEL
ALT: 20 U/L (ref 3–53)
AST: 22 U/L (ref 5–37)
Albumin: 3.7 g/dL (ref 3.5–5.2)
Alkaline Phosphatase: 124 U/L — ABNORMAL HIGH (ref 39–117)
Bilirubin, Direct: 0.2 mg/dL (ref 0.1–0.3)
Total Bilirubin: 0.8 mg/dL (ref 0.2–1.2)
Total Protein: 6.5 g/dL (ref 6.0–8.3)

## 2024-04-22 LAB — LIPID PANEL
Cholesterol: 116 mg/dL (ref 28–200)
HDL: 31.3 mg/dL — ABNORMAL LOW
LDL Cholesterol: 58 mg/dL (ref 10–99)
NonHDL: 84.38
Total CHOL/HDL Ratio: 4
Triglycerides: 130 mg/dL (ref 10.0–149.0)
VLDL: 26 mg/dL (ref 0.0–40.0)

## 2024-04-22 LAB — BASIC METABOLIC PANEL WITH GFR
BUN: 40 mg/dL — ABNORMAL HIGH (ref 6–23)
CO2: 37 meq/L — ABNORMAL HIGH (ref 19–32)
Calcium: 8.9 mg/dL (ref 8.4–10.5)
Chloride: 99 meq/L (ref 96–112)
Creatinine, Ser: 1.04 mg/dL (ref 0.40–1.50)
GFR: 68.86 mL/min
Glucose, Bld: 86 mg/dL (ref 70–99)
Potassium: 3.3 meq/L — ABNORMAL LOW (ref 3.5–5.1)
Sodium: 143 meq/L (ref 135–145)

## 2024-04-22 LAB — BRAIN NATRIURETIC PEPTIDE: Pro B Natriuretic peptide (BNP): 39 pg/mL (ref 1.0–100.0)

## 2024-04-22 LAB — URIC ACID: Uric Acid, Serum: 7 mg/dL (ref 4.0–7.8)

## 2024-04-22 LAB — HEMOGLOBIN A1C: Hgb A1c MFr Bld: 5.6 % (ref 4.6–6.5)

## 2024-04-22 LAB — TSH: TSH: 2.39 u[IU]/mL (ref 0.35–5.50)

## 2024-04-22 LAB — VITAMIN B12: Vitamin B-12: 1197 pg/mL — ABNORMAL HIGH (ref 211–911)

## 2024-04-22 LAB — TROPONIN I (HIGH SENSITIVITY): High Sens Troponin I: 8 ng/L (ref 2–17)

## 2024-04-22 MED ORDER — ZINC GLUCONATE 50 MG PO TABS: 50.0000 mg | ORAL_TABLET | Freq: Every day | ORAL | 0 refills | Status: AC

## 2024-04-22 NOTE — Patient Instructions (Signed)

## 2024-04-22 NOTE — Progress Notes (Unsigned)
 "  Subjective:  Patient ID: [name], male    DOB: 18-Jan-1946  Age: 79 y.o. MRN: [id number]  CC: Hypertension and Diabetes   HPI [name] presents for f/up ----  Discussed the use of AI scribe software for clinical note transcription with the patient, who gave verbal consent to proceed.  History of Present Illness OMKAR [name] is a 79 year old male who presents with dizziness and instability.  He experiences dizziness and instability, describing the sensation as 'real dizzy' and feeling 'not as stable as I used to be.' No palpitations or recent episodes of atrial fibrillation, with noted improvement since focusing on weight loss. He has lost approximately 100 pounds, reducing his weight from 404 pounds to 303 pounds, and attributes some improvement in symptoms to this weight loss.  He is currently taking Mounjaro  for weight management with no side effects other than reduced hunger. No abdominal pain, nausea, vomiting, diarrhea, constipation, belching, burping, or trouble swallowing. 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  No symptoms of high blood sugar such as excessive thirst or urination. He monitors his blood sugar regularly with his wife, who checks hers daily, and has not experienced any episodes of high blood sugar.  He wears compression socks and hospital socks, noting that if worn too long, his legs and feet become flaky, but there are no sores. His legs remain dark down low.  He takes a multivitamin with iron  daily, and occasionally takes B12, potassium, and melatonin. He sometimes receives B12 shots.     Outpatient Medications Prior to Visit  Medication Sig Dispense Refill   allopurinol  (ZYLOPRIM ) 100 MG tablet TAKE 1 TABLET(100 MG) BY MOUTH DAILY 90 tablet 1   atorvastatin  (LIPITOR ) 80 MG tablet TAKE 1 TABLET(80 MG) BY MOUTH DAILY 90 tablet 1   carboxymethylcellulose (REFRESH PLUS) 0.5 % SOLN  Place 1 drop into both eyes 3 (three) times daily as needed (dry eyes).     ciclopirox  (LOPROX ) 0.77 % cream Apply topically 2 (two) times daily. 90 g 3   [facility] MELATONIN GUMMIES PO Take 5 mg by mouth at bedtime.     Docusate Sodium  (DSS) 100 MG CAPS Take 100 mg by mouth every other day.     ELIQUIS  5 MG TABS tablet TAKE 1 TABLET(5 MG) BY MOUTH TWICE DAILY 180 tablet 1   empagliflozin  (JARDIANCE ) 10 MG TABS tablet TAKE 1 TABLET(10 MG) BY MOUTH DAILY BEFORE BREAKFAST 90 tablet 3   finasteride  (PROSCAR ) 5 MG tablet Take 1 tablet (5 mg total) by mouth daily. 30 tablet 1   flecainide  (TAMBOCOR ) 50 MG tablet TAKE 1 TABLET(50 MG) BY MOUTH EVERY 12 HOURS 180 tablet 3   LORazepam  (ATIVAN ) 0.5 MG tablet Take 1 tablet (0.5 mg total) by mouth at bedtime. 30 tablet 5   metoprolol  succinate (TOPROL  XL) 25 MG 24 hr tablet Take 0.5 tablets (12.5 mg total) by mouth daily. 45 tablet 3   montelukast  (SINGULAIR ) 10 MG tablet TAKE 1 TABLET(10 MG) BY MOUTH AT BEDTIME 90 tablet 1   Multiple Vitamin (MULTIVITAMIN WITH MINERALS) TABS tablet Take 1 tablet by mouth daily.     pantoprazole  (PROTONIX ) 40 MG tablet TAKE 1 TABLET(40 MG) BY MOUTH DAILY 90 tablet 1   potassium chloride  SA (KLOR-CON  M) 20 MEQ tablet TAKE 1 TABLET(20 MEQ) BY MOUTH TWICE DAILY 180 tablet 3   rOPINIRole  (REQUIP ) 0.5 MG tablet TAKE 1 TABLET(0.5 MG) BY MOUTH THREE  TIMES DAILY 270 tablet 4   sertraline  (ZOLOFT ) 100 MG tablet Take 2 tablets (200 mg total) by mouth at bedtime. 180 tablet 1   traMADol  (ULTRAM ) 50 MG tablet Take 1 tablet (50 mg total) by mouth 3 (three) times daily as needed for moderate pain (pain score 4-6). 90 tablet 5   tirzepatide  (MOUNJARO ) 10 MG/0.5ML Pen ADMINISTER 10 MG UNDER THE SKIN 1 TIME A WEEK 2 mL 0   torsemide  (DEMADEX ) 20 MG tablet TAKE 3 TABLETS BY MOUTH EVERY DAY 270 tablet 3   zinc  gluconate 50 MG tablet Take 1 tablet (50 mg total) by mouth daily. 90 tablet 1   No facility-administered medications prior to visit.     ROS Review of Systems  Constitutional:  Negative for appetite change, chills, diaphoresis, fatigue and fever.  HENT:  Positive for trouble swallowing and voice change. Negative for facial swelling.   Respiratory:  Positive for apnea and shortness of breath. Negative for cough, chest tightness and wheezing.   Cardiovascular:  Negative for chest pain, palpitations and leg swelling.  Gastrointestinal:  Negative for abdominal pain, constipation, diarrhea, nausea and vomiting.  Genitourinary:  Negative for difficulty urinating, dysuria and hematuria.  Musculoskeletal:  Positive for arthralgias and gait problem.  Skin: Negative.   Neurological:  Positive for dizziness, weakness and light-headedness.  Hematological:  Negative for adenopathy. Does not bruise/bleed easily.  Psychiatric/Behavioral: Negative.      Objective:  BP 126/66 (BP Location: Right Arm, Patient Position: Sitting, Cuff Size: Normal)   Pulse 76   Temp 98.3 F (36.8 C) (Oral)   Ht 6' (1.829 m)   Wt (!) 303 lb 9.6 oz (137.7 kg)   SpO2 93%   BMI 41.18 kg/m   BP Readings from Last 3 Encounters:  04/23/24 138/72  04/22/24 126/66  02/26/24 127/76    Wt Readings from Last 3 Encounters:  04/23/24 (!) 303 lb 9.6 oz (137.7 kg)  04/22/24 (!) 303 lb 9.6 oz (137.7 kg)  02/26/24 (!) 329 lb (149.2 kg)    Physical Exam Constitutional:      General: He is not in acute distress.    Appearance: He is obese. He is ill-appearing. He is not toxic-appearing or diaphoretic.  HENT:     Mouth/Throat:     Mouth: Mucous membranes are moist.  Eyes:     General: No scleral icterus.    Conjunctiva/sclera: Conjunctivae normal.  Cardiovascular:     Rate and Rhythm: Normal rate and regular rhythm.     Heart sounds: Heart sounds are distant. No murmur heard.    Comments: EKG- NSR, 75 bpm RBBB - not new Inverted T waves in inferolateral leads are new No LVH Pulmonary:     Effort: Pulmonary effort is normal.     Breath sounds:  No stridor. No wheezing, rhonchi or rales.  Abdominal:     General: Abdomen is protuberant. Bowel sounds are normal. There is no distension or abdominal bruit. There are no signs of injury.     Palpations: Abdomen is soft. There is no hepatomegaly, splenomegaly or mass.     Tenderness: There is no abdominal tenderness. There is no guarding.     Hernia: No hernia is present.  Musculoskeletal:     Right lower leg: No edema.     Left lower leg: No edema.  Skin:    General: Skin is warm and dry.  Neurological:     General: No focal deficit present.     Mental Status: He  is alert. Mental status is at baseline.     Lab Results  Component Value Date   WBC 10.8 (H) 04/22/2024   HGB 13.5 04/22/2024   HCT 40.0 04/22/2024   PLT 205.0 04/22/2024   GLUCOSE 86 04/22/2024   CHOL 116 04/22/2024   TRIG 130.0 04/22/2024   HDL 31.30 (L) 04/22/2024   LDLDIRECT 91.0 05/16/2018   LDLCALC 58 04/22/2024   ALT 20 04/22/2024   AST 22 04/22/2024   NA 143 04/22/2024   K 3.3 (L) 04/22/2024   CL 99 04/22/2024   CREATININE 1.04 04/22/2024   BUN 40 (H) 04/22/2024   CO2 37 (H) 04/22/2024   TSH 2.39 04/22/2024   PSA 0.31 06/19/2022   INR 1.6 (H) 12/02/2022   HGBA1C 5.6 04/22/2024   MICROALBUR <0.7 06/21/2023    DG Wrist Complete Right Result Date: 12/31/2023 CLINICAL DATA:  Trauma to the right wrist. EXAM: RIGHT WRIST - COMPLETE 3+ VIEW COMPARISON:  None Available. FINDINGS: No acute fracture or dislocation. The bones are osteopenic. There is mild diffuse subcutaneous edema. No radiopaque foreign object or soft tissue gas. IMPRESSION: 1. No acute fracture or dislocation. 2. Mild diffuse subcutaneous edema. Electronically Signed   By: Vanetta Chou M.D.   On: 12/31/2023 15:51   Estimated Creatinine Clearance: 84.1 mL/min (by C-G formula based on SCr of 1.04 mg/dL).   Assessment & Plan:  PAF (paroxysmal atrial fibrillation) (HCC)- He has good R/R control. -     TSH; Future -     EKG  12-Lead   Benign essential HTN- His BP is over-controlled. K+ is low. BUN is elevated. Will lower the loop diuretic dose. -     Basic metabolic panel with GFR; Future -     TSH; Future  B12 deficiency -     Vitamin B12; Future -     CBC with Differential/Platelet; Future  Chronic respiratory failure with hypoxia (HCC)- Doing well on oxygen .  Chronic heart failure with preserved ejection fraction (HCC) -     Brain natriuretic peptide; Future -     Troponin I (High Sensitivity); Future -     Torsemide ; Take 1 tablet (20 mg total) by mouth 2 (two) times daily.  Dispense: 180 tablet; Refill: 0  Anemia due to zinc  deficiency -     Zinc  Gluconate; Take 1 tablet (50 mg total) by mouth daily.  Dispense: 90 tablet; Refill: 0  PAH (pulmonary artery hypertension) (HCC) -     Torsemide ; Take 1 tablet (20 mg total) by mouth 2 (two) times daily.  Dispense: 180 tablet; Refill: 0  Hyperlipidemia with target LDL less than 130 -     Lipid panel; Future -     Hepatic function panel; Future -     TSH; Future  Idiopathic chronic gout of multiple sites without tophus -     Uric acid; Future  Type 2 diabetes mellitus with diabetic neuropathic arthropathy, without long-term current use of insulin  (HCC)- Blood sugar is well controlled. -     Basic metabolic panel with GFR; Future -     Hemoglobin A1c; Future  Abnormal electrocardiogram (ECG) (EKG) -     Brain natriuretic peptide; Future -     Troponin I (High Sensitivity); Future  Diuretic-induced hypokalemia -     AMB Referral [facility] Care Management     Follow-up: Return in about 3 months (around 07/21/2024).  Debby Molt, MD "

## 2024-04-23 ENCOUNTER — Encounter: Payer: Self-pay | Admitting: Pulmonary Disease

## 2024-04-23 ENCOUNTER — Ambulatory Visit: Admitting: Pulmonary Disease

## 2024-04-23 ENCOUNTER — Telehealth: Payer: Self-pay | Admitting: Internal Medicine

## 2024-04-23 VITALS — BP 138/72 | HR 68 | Temp 99.2°F | Ht 72.0 in | Wt 303.6 lb

## 2024-04-23 DIAGNOSIS — G4733 Obstructive sleep apnea (adult) (pediatric): Secondary | ICD-10-CM | POA: Diagnosis not present

## 2024-04-23 DIAGNOSIS — J9611 Chronic respiratory failure with hypoxia: Secondary | ICD-10-CM | POA: Diagnosis not present

## 2024-04-23 DIAGNOSIS — Z87891 Personal history of nicotine dependence: Secondary | ICD-10-CM

## 2024-04-23 DIAGNOSIS — I2722 Pulmonary hypertension due to left heart disease: Secondary | ICD-10-CM | POA: Diagnosis not present

## 2024-04-23 DIAGNOSIS — R600 Localized edema: Secondary | ICD-10-CM

## 2024-04-23 DIAGNOSIS — I2729 Other secondary pulmonary hypertension: Secondary | ICD-10-CM

## 2024-04-23 MED ORDER — TORSEMIDE 20 MG PO TABS: 20.0000 mg | ORAL_TABLET | Freq: Two times a day (BID) | ORAL | 0 refills | Status: AC

## 2024-04-23 NOTE — Progress Notes (Signed)
 Care Guide Pharmacy Note  04/23/2024 Name: [name] MRN: [id number] DOB: 03-04-1946  Referred By: [name], MD Reason for referral: Call Attempt #1 and Complex Care Management (Outreach to sch ref w/ pharm)   [name] is a 79 y.o. year old male who is a primary care patient of [name], MD.  [name] was referred to the pharmacist for assistance related to: HTN and DMII  An unsuccessful telephone outreach was attempted today to contact the patient who was referred to the pharmacy team for assistance with medication management. Additional attempts will be made to contact the patient.  Doyce Razor [facility], [facility] Guide Direct Dial: [phone]  Fax: [phone]

## 2024-04-23 NOTE — Patient Instructions (Addendum)
 No changes to medications  I am glad you are doing well  Return to clinic in 6 months or sooner as needed

## 2024-04-23 NOTE — Progress Notes (Signed)
 "  @Patient  ID: [name], male    DOB: [birth year], 79 y.o.   MRN: [id number]  Chief Complaint  Patient presents with   Follow-up    Pt states since LOV breathing has been good SOB occurs w/ any activity, pt states he is always SOB Pt is currently on 2L of 02 on poc    Referring provider: [name], MD  HPI:   79 y.o. man with history of OSA on NIPPV at night whom we are seeing for evaluation of pulmonary hypertension.  Most recent PCP note reviewed.  Most recent PM&R note reviewed.  Returns for routine follow-up.  Overall doing well.  Is lost 100 pounds over the last several months.  Doing well with POC.  No complaints.  Recent BMP reviewed, within normal limits.  He is using his BiPAP at night.  Seems like it is doing well.  Encouraged him to continue.  Hpi at initial visit: Patient referred to me for further discussion regarding pulmonary hypertension.  He had a right heart catheterization in 2023 that shows clear evidence of postcapillary pulmonary hypertension, WHO group 2 with PVR less than 2, elevated wedge, wedge further elevated significantly with exertion.  We discussed at length the etiology of pulmonary hypertension in general, it seems he is read a lot on the Internet about PAH.  We differentiated his pulmonary hypertension from [facility].  We discussed at length the etiology of pulmonary hypertension in him specifically.  We discussed the role and rationale for treatment and discussed strategies at treating his pulmonary hypertension.  Tried to answer questions to the best of my ability.  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  We discussed the chronic nature of this disease and likely worsening in the future but hopefully with WHO group 2 this will be a slow decline.  We discussed briefly mortality as a relates to pulmonary hypertension using risk  calculator.  Questionaires / Pulmonary Flowsheets:   ACT:      No data to display          MMRC:     No data to display          Epworth:     08/28/2012   11:06 AM  Results of the Epworth flowsheet  Sitting and reading 2  Watching TV 2  Sitting, inactive in a public place (e.g. a theatre or a meeting) 0  As a passenger in a car for an hour without a break 0  Lying down to rest in the afternoon when circumstances permit 2  Sitting and talking to someone 0  Sitting quietly after a lunch without alcohol  2  In a car, while stopped for a few minutes in traffic 0  Total score 8    Tests:   FENO:  No results found for: NITRICOXIDE  PFT:    Latest Ref Rng & Units 01/03/2017    2:03 PM  PFT Results  FVC-Pre L 3.98   FVC-Predicted Pre % 84   FVC-Post L 3.94   FVC-Predicted Post % 83   Pre FEV1/FVC % % 74   Post FEV1/FCV % % 76   FEV1-Pre L 2.96   FEV1-Predicted Pre % 85   FEV1-Post L 3.00   DLCO uncorrected ml/min/mmHg 27.43   DLCO UNC% % 78   DLCO corrected ml/min/mmHg 28.63   DLCO COR %Predicted % 81   DLVA Predicted %  98   TLC L 7.13   TLC % Predicted % 96   RV % Predicted % 114     WALK:     08/02/2023    4:37 PM 03/25/2021   10:50 AM 03/25/2021   10:45 AM 04/08/2018    1:48 PM 12/27/2016    3:08 PM  SIX MIN WALK  Supplimental Oxygen  during Test? (L/min) Yes Yes No No No  O2 Flow Rate  2 L/min     Type Pulse Continuous     Tech Comments: Patient will need 2L of poc Pt's saturations remained stable on 2L O2. pt was stopped and placed on O2. Pt stopped due to fatigue and SOB pt completed 3 laps at avg pace with can, bobbled on third lap and very SOB on last two laps. kw    Imaging: Personally reviewed and as per EMR and discussion in this note No results found.  Lab Results: Personally reviewed CBC    Component Value Date/Time   WBC 10.8 (H) 04/22/2024 1456   RBC 4.40 04/22/2024 1456   HGB 13.5 04/22/2024 1456   HGB 12.3 (L) 07/20/2023  1507   HCT 40.0 04/22/2024 1456   HCT 37.7 07/20/2023 1507   PLT 205.0 04/22/2024 1456   PLT 197 07/20/2023 1507   MCV 91.0 04/22/2024 1456   MCV 96 07/20/2023 1507   MCH 31.3 07/20/2023 1507   MCH 30.7 06/24/2023 0314   MCHC 33.6 04/22/2024 1456   RDW 14.5 04/22/2024 1456   RDW 12.1 07/20/2023 1507   LYMPHSABS 1.6 04/22/2024 1456   MONOABS 1.0 04/22/2024 1456   EOSABS 0.1 04/22/2024 1456   BASOSABS 0.1 04/22/2024 1456    BMET    Component Value Date/Time   NA 143 04/22/2024 1456   NA 143 07/20/2023 1507   K 3.3 (L) 04/22/2024 1456   CL 99 04/22/2024 1456   CO2 37 (H) 04/22/2024 1456   GLUCOSE 86 04/22/2024 1456   BUN 40 (H) 04/22/2024 1456   BUN 29 (H) 07/20/2023 1507   CREATININE 1.04 04/22/2024 1456   CREATININE 1.18 10/27/2019 1400   CALCIUM  8.9 04/22/2024 1456   GFRNONAA >60 06/24/2023 0314   GFRNONAA 61 10/27/2019 1400   GFRAA 71 10/27/2019 1400    BNP    Component Value Date/Time   BNP 771.9 (H) 12/02/2022 0900    ProBNP    Component Value Date/Time   PROBNP 39.0 04/22/2024 1456    Specialty Problems       Pulmonary Problems   OSA (obstructive sleep apnea)   NPSG 10/25/05- severe OSA, AHI 79/ hr, CPAP to 16. Frequent limb jerks with sleep disturbance      Vocal fold paralysis, left   Chronic respiratory failure with hypoxia (HCC)   O2 at home dropped to 75%, it takes him 5-10 mins to recover 90% New oxygen  requirement 03/22/2021        Allergies  Allergen Reactions   Morphine Nausea And Vomiting and Other (See Comments)    Severe hallucinations Other reaction(s): Hallucinations   Penicillins Other (See Comments)    Passed out after injection Did it involve swelling of the face/tongue/throat, SOB, or low BP? Unknown Did it involve sudden or severe rash/hives, skin peeling, or any reaction on the inside of your mouth or nose? No Did you need to seek medical attention at a hospital or doctor's office? Yes When did it last happen?     teenager   If all above answers are NO, may  proceed with cephalosporin use. Other reaction(s): Dizziness, Unknown   Codeine Nausea Only   Propoxyphene N-Acetaminophen  Nausea Only    Immunization History  Administered Date(s) Administered   Fluad Quad(high Dose 65+) 12/11/2018, 02/18/2021, 02/07/2022   Fluad Trivalent(High Dose 65+) 01/29/2023   INFLUENZA, HIGH DOSE SEASONAL PF 01/20/2016, 01/31/2017, 03/06/2018   Influenza,inj,Quad PF,6+ Mos 04/09/2013   Influenza-Unspecified 03/08/2018, 03/02/2020   PFIZER(Purple Top)SARS-COV-2 Vaccination 05/10/2019, 06/04/2019, 03/02/2020, 02/18/2021   Pneumococcal Conjugate-13 10/20/2015   Pneumococcal Polysaccharide-23 04/09/2013, 10/27/2019   Td 11/02/2009   Tdap 01/20/2016    Past Medical History:  Diagnosis Date   Anemia 07/29/2013   Asthma    childhood asthma   Benign essential HTN    Benign prostatic hypertrophy    several prostate biopsies neg for cancer.    Bilateral hip pain    Cancer (HCC)    CHF (congestive heart failure) (HCC)    Chronic anticoagulation 07/22/2013   Complication of anesthesia    MORPHINE CAUSES HALLUCINATIONS   CVA (cerebral vascular accident) (HCC) 06/2016   Depression    Diabetes mellitus without complication (HCC)    Diastolic congestive heart failure (HCC) 07/16/2013   Dysphagia, post-stroke    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   FH: colonic polyps 2010   Gout    History of kidney stones    Hypertension    Kidney cysts    Morbid obesity (HCC)    Numbness    feet / legs   PAF (paroxysmal atrial fibrillation) with RVR 07/29/2013   Pericarditis 2015   s/p window   Recurrent pulmonary emboli (HCC) 06/13/2015   Sleep apnea    USES C PAP   Stroke syndrome    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    Tobacco History: Social History   Tobacco Use  Smoking Status Former   Current packs/day: 0.00   Average packs/day: 0.2 packs/day for 13.4 years (2.7 ttl pk-yrs)   Types: Cigarettes    Start date: 04/03/1961   Quit date: 08/30/1974   Years since quitting: 49.6  Smokeless Tobacco Never   Counseling given: Not Answered   Continue to not smoke  Outpatient Encounter Medications as of 04/23/2024  Medication Sig   allopurinol  (ZYLOPRIM ) 100 MG tablet TAKE 1 TABLET(100 MG) BY MOUTH DAILY   atorvastatin  (LIPITOR ) 80 MG tablet TAKE 1 TABLET(80 MG) BY MOUTH DAILY   carboxymethylcellulose (REFRESH PLUS) 0.5 % SOLN Place 1 drop into both eyes 3 (three) times daily as needed (dry eyes).   ciclopirox  (LOPROX ) 0.77 % cream Apply topically 2 (two) times daily.   [facility] MELATONIN GUMMIES PO Take 5 mg by mouth at bedtime.   Docusate Sodium  (DSS) 100 MG CAPS Take 100 mg by mouth every other day.   ELIQUIS  5 MG TABS tablet TAKE 1 TABLET(5 MG) BY MOUTH TWICE DAILY   empagliflozin  (JARDIANCE ) 10 MG TABS tablet TAKE 1 TABLET(10 MG) BY MOUTH DAILY BEFORE BREAKFAST   finasteride  (PROSCAR ) 5 MG tablet Take 1 tablet (5 mg total) by mouth daily.   flecainide  (TAMBOCOR ) 50 MG tablet TAKE 1 TABLET(50 MG) BY MOUTH EVERY 12 HOURS   LORazepam  (ATIVAN ) 0.5 MG tablet Take 1 tablet (0.5 mg total) by mouth at bedtime.   metoprolol  succinate (TOPROL  XL) 25 MG 24 hr tablet Take 0.5 tablets (12.5 mg total) by mouth daily.   montelukast  (SINGULAIR ) 10 MG tablet TAKE 1 TABLET(10 MG) BY MOUTH AT BEDTIME   Multiple Vitamin (MULTIVITAMIN WITH MINERALS) TABS tablet Take 1 tablet by mouth daily.   pantoprazole  (  PROTONIX ) 40 MG tablet TAKE 1 TABLET(40 MG) BY MOUTH DAILY   potassium chloride  SA (KLOR-CON  M) 20 MEQ tablet TAKE 1 TABLET(20 MEQ) BY MOUTH TWICE DAILY   rOPINIRole  (REQUIP ) 0.5 MG tablet TAKE 1 TABLET(0.5 MG) BY MOUTH THREE TIMES DAILY   sertraline  (ZOLOFT ) 100 MG tablet Take 2 tablets (200 mg total) by mouth at bedtime.   tirzepatide  (MOUNJARO ) 10 MG/0.5ML Pen ADMINISTER 10 MG UNDER THE SKIN 1 TIME A WEEK   torsemide  (DEMADEX ) 20 MG tablet Take 1 tablet (20 mg total) by mouth 2 (two) times daily.   traMADol   (ULTRAM ) 50 MG tablet Take 1 tablet (50 mg total) by mouth 3 (three) times daily as needed for moderate pain (pain score 4-6).   zinc  gluconate 50 MG tablet Take 1 tablet (50 mg total) by mouth daily.   No facility-administered encounter medications on file as of 04/23/2024.     Review of Systems  Review of Systems  N/a Physical Exam  BP 138/72   Pulse 68   Temp 99.2 F (37.3 C) (Oral)   Ht 6' (1.829 m) Comment: per patient  Wt (!) 303 lb 9.6 oz (137.7 kg) Comment: per patient  SpO2 95% Comment: on 2l of poc  BMI 41.18 kg/m   Wt Readings from Last 5 Encounters:  04/23/24 (!) 303 lb 9.6 oz (137.7 kg)  04/22/24 (!) 303 lb 9.6 oz (137.7 kg)  02/26/24 (!) 329 lb (149.2 kg)  08/02/23 (!) 329 lb 12.8 oz (149.6 kg)  07/25/23 (!) 326 lb (147.9 kg)    BMI Readings from Last 5 Encounters:  04/23/24 41.18 kg/m  04/22/24 41.18 kg/m  02/26/24 44.62 kg/m  11/06/23 43.41 kg/m  10/11/23 43.41 kg/m     Physical Exam General: In chair, no distress Eyes: No icterus Neck: No JVP Pulmonary: Clear bilaterally, normal work of breathing Cardiovascular: Regular rate and rhythm no murmur   Assessment & Plan:   Pulmonary hypertension: Postcapillary, WHO group 2 in the setting of presumed diastolic dysfunction, valvular dysfunction, as demonstrated on serial TTE's and right heart catheterization in 2023 with elevated wedge pressure as well as PVR less than 2.  With exercise his PCWP markedly increased during right heart catheterization.  Encouraged low-salt diet, strict attendance of diuretics, ongoing efforts at weight loss. BNP WNL 04/22/24. No role for pulmonary vasodilators at this time.  OSA: Encouraged ongoing NIPPV, BiPAP at night.  He is benefiting clinically from this.  With weight loss, consider a repeat split night study.  Chronic hypoxemic respiratory failure: Suspect related to pulmonary venous congestion.  Doing well on 2L via POC.   Return in about 6 months (around  10/21/2024) for f/u Dr. Annella.   Donnice JONELLE Annella, MD 04/23/2024    "

## 2024-04-23 NOTE — Progress Notes (Signed)
 Care Guide Pharmacy Note  04/23/2024 Name: [name] MRN: [id number] DOB: [birth year]  Referred By: [name], MD Reason for referral: Call Attempt #1, Complex Care Management (Outreach to sch ref w/ pharm), and Call Attempt #2   [name] is a 79 y.o. year old male who is a primary care patient of [name], MD.  [name] was referred to the pharmacist for assistance related to: HTN and DMII  Successful contact was made with the patient to discuss pharmacy services including being ready for the pharmacist to call at least 5 minutes before the scheduled appointment time and to have medication bottles and any blood pressure readings ready for review. The patient agreed to meet with the pharmacist via telephone visit on 05/13/2024.  [name] [name], [name] Guide Direct Dial: [phone]  Fax: [phone]

## 2024-04-23 NOTE — Progress Notes (Deleted)
 "  @Patient  ID: [name], male    DOB: [birth year], 79 y.o.   MRN: [id number]  Chief Complaint  Patient presents with   Follow-up    Pt states since LOV breathing has been good SOB occurs w/ any activity, pt states he is always SOB Pt is currently on 2L of 02 on poc    Referring provider: [name], MD  HPI:   [name].  Multiple prior pulmonary notes, Dr. Neysa, managing OSA reviewed.  Multiple cardiology notes reviewed.   HPI at initial visit: Patient referred to me for further discussion regarding pulmonary [name].  He had a right heart catheterization in 2023 that shows clear evidence of postcapillary pulmonary [name], WHO group 2 with PVR less than 2, elevated wedge, wedge further elevated significantly with exertion.  We discussed at length the etiology of pulmonary [name] in general, it seems he is read a lot on the Internet about PAH.  We differentiated his pulmonary [name] from [facility].  We discussed at length the etiology of pulmonary [name] in him specifically.  We discussed the role and rationale for treatment and discussed strategies at treating his pulmonary [name].  Tried to answer questions to the best of my ability.  We reviewed in detail serial echocardiograms dating back to 2014 that shows left atrial dilation, clear evidence of chronic pulmonary venous [name].  We discussed the chronic nature of this disease and likely worsening in the future but hopefully with WHO group 2 this will be a slow decline.  We discussed briefly mortality as a relates to pulmonary [name] using risk calculator.  Questionaires / Pulmonary Flowsheets:   ACT:      No data to display          MMRC:     No data to display          Epworth:     08/28/2012   11:06 AM  Results of the Epworth flowsheet  Sitting and reading 2   Watching TV 2  Sitting, inactive in a public place (e.g. a theatre or a meeting) 0  As a passenger in a car for an hour without a break 0  Lying down to rest in the afternoon when circumstances permit 2  Sitting and talking to someone 0  Sitting quietly after a lunch without alcohol  2  In a car, while stopped for a few minutes in traffic 0  Total score 8    Tests:   FENO:  No results found for: NITRICOXIDE  PFT:    Latest Ref Rng & Units 01/03/2017    2:03 PM  PFT Results  FVC-Pre L 3.98   FVC-Predicted Pre % 84   FVC-Post L 3.94   FVC-Predicted Post % 83   Pre FEV1/FVC % % 74   Post FEV1/FCV % % 76   FEV1-Pre L 2.96   FEV1-Predicted Pre % 85   FEV1-Post L 3.00   DLCO uncorrected ml/min/mmHg 27.43   DLCO UNC% % 78   DLCO corrected ml/min/mmHg 28.63   DLCO COR %Predicted % 81   DLVA Predicted % 98   TLC L 7.13   TLC % Predicted % 96   RV % Predicted % 114     WALK:     08/02/2023    4:37 PM 03/25/2021   10:50 AM 03/25/2021   10:45 AM 04/08/2018    1:48  PM 12/27/2016    3:08 PM  SIX MIN WALK  Supplimental Oxygen  during Test? (L/min) Yes Yes No No No  O2 Flow Rate  2 L/min     Type Pulse Continuous     Tech Comments: Patient will need 2L of poc Pt's saturations remained stable on 2L O2. pt was stopped and placed on O2. Pt stopped due to fatigue and SOB pt completed 3 laps at avg pace with can, bobbled on third lap and very SOB on last two laps. kw    Imaging: Personally reviewed and as per EMR and discussion in this note No results found.  Lab Results: Personally reviewed CBC    Component Value Date/Time   WBC 10.8 (H) 04/22/2024 1456   RBC 4.40 04/22/2024 1456   HGB 13.5 04/22/2024 1456   HGB 12.3 (L) 07/20/2023 1507   HCT 40.0 04/22/2024 1456   HCT 37.7 07/20/2023 1507   PLT 205.0 04/22/2024 1456   PLT 197 07/20/2023 1507   MCV 91.0 04/22/2024 1456   MCV 96 07/20/2023 1507   MCH 31.3 07/20/2023 1507   MCH 30.7 06/24/2023 0314   MCHC 33.6  04/22/2024 1456   RDW 14.5 04/22/2024 1456   RDW 12.1 07/20/2023 1507   LYMPHSABS 1.6 04/22/2024 1456   MONOABS 1.0 04/22/2024 1456   EOSABS 0.1 04/22/2024 1456   BASOSABS 0.1 04/22/2024 1456    BMET    Component Value Date/Time   NA 143 04/22/2024 1456   NA 143 07/20/2023 1507   K 3.3 (L) 04/22/2024 1456   CL 99 04/22/2024 1456   CO2 37 (H) 04/22/2024 1456   GLUCOSE 86 04/22/2024 1456   BUN 40 (H) 04/22/2024 1456   BUN 29 (H) 07/20/2023 1507   CREATININE 1.04 04/22/2024 1456   CREATININE 1.18 10/27/2019 1400   CALCIUM  8.9 04/22/2024 1456   GFRNONAA >60 06/24/2023 0314   GFRNONAA 61 10/27/2019 1400   GFRAA 71 10/27/2019 1400    BNP    Component Value Date/Time   BNP 771.9 (H) 12/02/2022 0900    ProBNP    Component Value Date/Time   PROBNP 39.0 04/22/2024 1456    Specialty Problems       Pulmonary Problems   OSA (obstructive sleep apnea)   NPSG 10/25/05- severe OSA, AHI 79/ hr, CPAP to 16. Frequent limb jerks with sleep disturbance      Vocal fold paralysis, left   Chronic respiratory failure with hypoxia (HCC)   O2 at home dropped to 75%, it takes him 5-10 mins to recover 90% New oxygen  requirement 03/22/2021        Allergies  Allergen Reactions   Morphine Nausea And Vomiting and Other (See Comments)    Severe hallucinations Other reaction(s): Hallucinations   Penicillins Other (See Comments)    Passed out after injection Did it involve swelling of the face/tongue/throat, SOB, or low BP? Unknown Did it involve sudden or severe rash/hives, skin peeling, or any reaction on the inside of your mouth or nose? No Did you need to seek medical attention at a hospital or doctor's office? Yes When did it last happen?    teenager   If all above answers are NO, may proceed with cephalosporin use. Other reaction(s): Dizziness, Unknown   Codeine Nausea Only   Propoxyphene N-Acetaminophen  Nausea Only    Immunization History  Administered Date(s)  Administered   Fluad Quad(high Dose 65+) 12/11/2018, 02/18/2021, 02/07/2022   Fluad Trivalent(High Dose 65+) 01/29/2023   INFLUENZA, HIGH DOSE SEASONAL  PF 01/20/2016, 01/31/2017, 03/06/2018   Influenza,inj,Quad PF,6+ Mos 04/09/2013   Influenza-Unspecified 03/08/2018, 03/02/2020   PFIZER(Purple Top)SARS-COV-2 Vaccination 05/10/2019, 06/04/2019, 03/02/2020, 02/18/2021   Pneumococcal Conjugate-13 10/20/2015   Pneumococcal Polysaccharide-23 04/09/2013, 10/27/2019   Td 11/02/2009   Tdap 01/20/2016    Past Medical History:  Diagnosis Date   Anemia 07/29/2013   Asthma    childhood asthma   Benign essential HTN    Benign prostatic hypertrophy    several prostate biopsies neg for cancer.    Bilateral hip pain    Cancer (HCC)    CHF (congestive heart failure) (HCC)    Chronic anticoagulation 07/22/2013   Complication of anesthesia    MORPHINE CAUSES HALLUCINATIONS   CVA (cerebral vascular accident) (HCC) 06/2016   Depression    Diabetes mellitus without complication (HCC)    Diastolic congestive heart failure (HCC) 07/16/2013   Dysphagia, post-stroke    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   FH: colonic polyps 2010   Gout    History of kidney stones    [name]    Kidney cysts    Morbid obesity (HCC)    Numbness    feet / legs   PAF (paroxysmal atrial fibrillation) with RVR 07/29/2013   Pericarditis 2015   s/p window   Recurrent pulmonary emboli (HCC) 06/13/2015   Sleep apnea    USES C PAP   Stroke syndrome    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    Tobacco History: Social History   Tobacco Use  Smoking Status Former   Current packs/day: 0.00   Average packs/day: 0.2 packs/day for 13.4 years (2.7 ttl pk-yrs)   Types: Cigarettes   Start date: 04/03/1961   Quit date: 08/30/1974   Years since quitting: 49.6  Smokeless Tobacco Never   Counseling given: Not Answered   Continue to not smoke  Outpatient Encounter Medications as of 04/23/2024  Medication Sig    allopurinol  (ZYLOPRIM ) 100 MG tablet TAKE 1 TABLET(100 MG) BY MOUTH DAILY   atorvastatin  (LIPITOR ) 80 MG tablet TAKE 1 TABLET(80 MG) BY MOUTH DAILY   carboxymethylcellulose (REFRESH PLUS) 0.5 % SOLN Place 1 drop into both eyes 3 (three) times daily as needed (dry eyes).   ciclopirox  (LOPROX ) 0.77 % cream Apply topically 2 (two) times daily.   [employer] MELATONIN GUMMIES PO Take 5 mg by mouth at bedtime.   Docusate Sodium  (DSS) 100 MG CAPS Take 100 mg by mouth every other day.   ELIQUIS  5 MG TABS tablet TAKE 1 TABLET(5 MG) BY MOUTH TWICE DAILY   empagliflozin  (JARDIANCE ) 10 MG TABS tablet TAKE 1 TABLET(10 MG) BY MOUTH DAILY BEFORE BREAKFAST   finasteride  (PROSCAR ) 5 MG tablet Take 1 tablet (5 mg total) by mouth daily.   flecainide  (TAMBOCOR ) 50 MG tablet TAKE 1 TABLET(50 MG) BY MOUTH EVERY 12 HOURS   LORazepam  (ATIVAN ) 0.5 MG tablet Take 1 tablet (0.5 mg total) by mouth at bedtime.   metoprolol  succinate (TOPROL  XL) 25 MG 24 hr tablet Take 0.5 tablets (12.5 mg total) by mouth daily.   montelukast  (SINGULAIR ) 10 MG tablet TAKE 1 TABLET(10 MG) BY MOUTH AT BEDTIME   Multiple Vitamin (MULTIVITAMIN WITH MINERALS) TABS tablet Take 1 tablet by mouth daily.   pantoprazole  (PROTONIX ) 40 MG tablet TAKE 1 TABLET(40 MG) BY MOUTH DAILY   potassium chloride  SA (KLOR-CON  M) 20 MEQ tablet TAKE 1 TABLET(20 MEQ) BY MOUTH TWICE DAILY   rOPINIRole  (REQUIP ) 0.5 MG tablet TAKE 1 TABLET(0.5 MG) BY MOUTH THREE TIMES DAILY   sertraline  (ZOLOFT ) 100  MG tablet Take 2 tablets (200 mg total) by mouth at bedtime.   tirzepatide  (MOUNJARO ) 10 MG/0.5ML Pen ADMINISTER 10 MG UNDER THE SKIN 1 TIME A WEEK   torsemide  (DEMADEX ) 20 MG tablet Take 1 tablet (20 mg total) by mouth 2 (two) times daily.   traMADol  (ULTRAM ) 50 MG tablet Take 1 tablet (50 mg total) by mouth 3 (three) times daily as needed for moderate pain (pain score 4-6).   zinc  gluconate 50 MG tablet Take 1 tablet (50 mg total) by mouth daily.   No facility-administered  encounter medications on file as of 04/23/2024.     Review of Systems  Review of Systems  No chest pain with exertion.  No orthopnea or PND.  Comprehensive review of systems otherwise negative. Physical Exam  BP 138/72   Pulse 68   Temp 99.2 F (37.3 C) (Oral)   Ht 6' (1.829 m) Comment: per patient  Wt (!) 303 lb 9.6 oz (137.7 kg) Comment: per patient  SpO2 95% Comment: on 2l of poc  BMI 41.18 kg/m   Wt Readings from Last 5 Encounters:  04/23/24 (!) 303 lb 9.6 oz (137.7 kg)  04/22/24 (!) 303 lb 9.6 oz (137.7 kg)  02/26/24 (!) 329 lb (149.2 kg)  08/02/23 (!) 329 lb 12.8 oz (149.6 kg)  07/25/23 (!) 326 lb (147.9 kg)    BMI Readings from Last 5 Encounters:  04/23/24 41.18 kg/m  04/22/24 41.18 kg/m  02/26/24 44.62 kg/m  11/06/23 43.41 kg/m  10/11/23 43.41 kg/m     Physical Exam General: In chair no distress Eyes: No icterus Neck: JVP sitting upright Pulmonary: Clear bilaterally, normal work of breathing Cardiovascular: Regular rate and rhythm no murmur   Assessment & Plan:   Pulmonary [name]: Postcapillary, WHO group 2 in the setting of presumed diastolic dysfunction, valvular dysfunction, as demonstrated on serial TTE's and right heart catheterization in 2023 with elevated wedge pressure as well as PVR less than 2.  With exercise his weight is markedly increased during right heart catheterization.  Encouraged low-salt diet, strict attendance of diuretics, ongoing efforts at weight loss.  Reveal light score low risk.  OSA: Encouraged ongoing NIPPV at night.  He is benefiting clinically from this.  He is using regularly per report.  Chronic hypoxemic respiratory failure: Suspect related to pulmonary venous congestion.  New order for POC today.   Return in about 6 months (around 10/21/2024) for f/u Dr. Annella.   Donnice JONELLE Annella, MD 04/23/2024    "

## 2024-04-24 MED ORDER — MOUNJARO 10 MG/0.5ML ~~LOC~~ SOAJ: 10.0000 mg | SUBCUTANEOUS | 0 refills | Status: AC

## 2024-05-06 ENCOUNTER — Other Ambulatory Visit: Payer: Self-pay | Admitting: Registered Nurse

## 2024-05-07 NOTE — Telephone Encounter (Signed)
 PDMP was Reviewed.  Tramadol  e-scribed to pharmacy  He has a scheduled appointment this month

## 2024-05-13 ENCOUNTER — Other Ambulatory Visit

## 2024-05-27 ENCOUNTER — Encounter: Admitting: Registered Nurse

## 2024-06-05 ENCOUNTER — Ambulatory Visit: Admitting: Internal Medicine

## 2024-06-16 ENCOUNTER — Telehealth (HOSPITAL_COMMUNITY): Admitting: Psychiatry

## 2024-07-18 ENCOUNTER — Ambulatory Visit

## 2024-07-21 ENCOUNTER — Ambulatory Visit: Admitting: Internal Medicine

## 2024-10-17 ENCOUNTER — Ambulatory Visit
